# Patient Record
Sex: Male | Born: 1945 | Race: White | Hispanic: No | Smoking: Former smoker
Health system: Southern US, Community
[De-identification: ages and names within clinical notes are randomized; demographics above are authoritative.]

## PROBLEM LIST (undated history)

## (undated) ENCOUNTER — Emergency Department (HOSPITAL_COMMUNITY): Admission: EM | Payer: Medicare Other

## (undated) DIAGNOSIS — E1169 Type 2 diabetes mellitus with other specified complication: Secondary | ICD-10-CM

## (undated) DIAGNOSIS — F329 Major depressive disorder, single episode, unspecified: Secondary | ICD-10-CM

## (undated) DIAGNOSIS — M1990 Unspecified osteoarthritis, unspecified site: Secondary | ICD-10-CM

## (undated) DIAGNOSIS — C801 Malignant (primary) neoplasm, unspecified: Secondary | ICD-10-CM

## (undated) DIAGNOSIS — E611 Iron deficiency: Secondary | ICD-10-CM

## (undated) DIAGNOSIS — F32A Depression, unspecified: Secondary | ICD-10-CM

## (undated) DIAGNOSIS — N183 Chronic kidney disease, stage 3 (moderate): Secondary | ICD-10-CM

## (undated) DIAGNOSIS — R413 Other amnesia: Secondary | ICD-10-CM

## (undated) DIAGNOSIS — I10 Essential (primary) hypertension: Secondary | ICD-10-CM

## (undated) DIAGNOSIS — K219 Gastro-esophageal reflux disease without esophagitis: Secondary | ICD-10-CM

## (undated) DIAGNOSIS — N521 Erectile dysfunction due to diseases classified elsewhere: Secondary | ICD-10-CM

## (undated) DIAGNOSIS — E785 Hyperlipidemia, unspecified: Secondary | ICD-10-CM

## (undated) DIAGNOSIS — E119 Type 2 diabetes mellitus without complications: Secondary | ICD-10-CM

## (undated) DIAGNOSIS — E291 Testicular hypofunction: Secondary | ICD-10-CM

## (undated) DIAGNOSIS — Z87891 Personal history of nicotine dependence: Secondary | ICD-10-CM

## (undated) DIAGNOSIS — K5790 Diverticulosis of intestine, part unspecified, without perforation or abscess without bleeding: Secondary | ICD-10-CM

## (undated) DIAGNOSIS — J301 Allergic rhinitis due to pollen: Secondary | ICD-10-CM

## (undated) DIAGNOSIS — Z860100 Personal history of colon polyps, unspecified: Secondary | ICD-10-CM

## (undated) HISTORY — PX: TOTAL HIP ARTHROPLASTY: SHX124

## (undated) HISTORY — DX: Type 2 diabetes mellitus with other specified complication: E11.69

## (undated) HISTORY — DX: Essential (primary) hypertension: I10

## (undated) HISTORY — DX: Personal history of colon polyps, unspecified: Z86.0100

## (undated) HISTORY — DX: Hyperlipidemia, unspecified: E78.5

## (undated) HISTORY — DX: Erectile dysfunction due to diseases classified elsewhere: N52.1

## (undated) HISTORY — DX: Allergic rhinitis due to pollen: J30.1

## (undated) HISTORY — DX: Diverticulosis of intestine, part unspecified, without perforation or abscess without bleeding: K57.90

## (undated) HISTORY — DX: Other amnesia: R41.3

## (undated) HISTORY — DX: Gastro-esophageal reflux disease without esophagitis: K21.9

## (undated) HISTORY — DX: Unspecified osteoarthritis, unspecified site: M19.90

## (undated) HISTORY — DX: Iron deficiency: E61.1

## (undated) HISTORY — DX: Personal history of nicotine dependence: Z87.891

## (undated) HISTORY — PX: EYE SURGERY: SHX253

## (undated) HISTORY — DX: Personal history of colonic polyps: Z86.010

## (undated) HISTORY — DX: Depression, unspecified: F32.A

## (undated) HISTORY — DX: Testicular hypofunction: E29.1

## (undated) HISTORY — DX: Major depressive disorder, single episode, unspecified: F32.9

## (undated) HISTORY — DX: Type 2 diabetes mellitus without complications: E11.9

## (undated) HISTORY — DX: Chronic kidney disease, stage 3 (moderate): N18.3

---
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
## 1978-04-12 HISTORY — PX: CHOLECYSTECTOMY: SHX55

## 2003-04-30 ENCOUNTER — Encounter: Admission: RE | Admit: 2003-04-30 | Discharge: 2003-04-30 | Payer: Self-pay | Admitting: Family Medicine

## 2003-05-10 ENCOUNTER — Encounter: Admission: RE | Admit: 2003-05-10 | Discharge: 2003-05-10 | Payer: Self-pay | Admitting: Family Medicine

## 2003-05-13 ENCOUNTER — Encounter: Admission: RE | Admit: 2003-05-13 | Discharge: 2003-05-13 | Payer: Self-pay | Admitting: Family Medicine

## 2003-08-28 ENCOUNTER — Encounter: Admission: RE | Admit: 2003-08-28 | Discharge: 2003-08-28 | Payer: Self-pay | Admitting: Orthopedic Surgery

## 2004-03-03 ENCOUNTER — Encounter: Admission: RE | Admit: 2004-03-03 | Discharge: 2004-03-03 | Payer: Self-pay | Admitting: Family Medicine

## 2004-06-29 ENCOUNTER — Ambulatory Visit: Payer: Self-pay | Admitting: Internal Medicine

## 2004-07-13 ENCOUNTER — Ambulatory Visit: Payer: Self-pay | Admitting: Internal Medicine

## 2005-05-20 ENCOUNTER — Emergency Department (HOSPITAL_COMMUNITY): Admission: EM | Admit: 2005-05-20 | Discharge: 2005-05-20 | Payer: Self-pay | Admitting: Emergency Medicine

## 2005-06-11 ENCOUNTER — Encounter: Admission: RE | Admit: 2005-06-11 | Discharge: 2005-06-11 | Payer: Self-pay | Admitting: Family Medicine

## 2007-04-13 HISTORY — PX: TOTAL HIP ARTHROPLASTY: SHX124

## 2007-11-01 ENCOUNTER — Inpatient Hospital Stay (HOSPITAL_COMMUNITY): Admission: RE | Admit: 2007-11-01 | Discharge: 2007-11-05 | Payer: Self-pay | Admitting: Orthopedic Surgery

## 2008-06-13 ENCOUNTER — Ambulatory Visit: Payer: Self-pay | Admitting: Family Medicine

## 2008-06-13 DIAGNOSIS — M1990 Unspecified osteoarthritis, unspecified site: Secondary | ICD-10-CM | POA: Insufficient documentation

## 2008-06-13 DIAGNOSIS — M109 Gout, unspecified: Secondary | ICD-10-CM | POA: Insufficient documentation

## 2008-06-13 DIAGNOSIS — I10 Essential (primary) hypertension: Secondary | ICD-10-CM

## 2008-06-13 DIAGNOSIS — E781 Pure hyperglyceridemia: Secondary | ICD-10-CM

## 2008-06-13 DIAGNOSIS — K219 Gastro-esophageal reflux disease without esophagitis: Secondary | ICD-10-CM

## 2008-06-13 DIAGNOSIS — F3341 Major depressive disorder, recurrent, in partial remission: Secondary | ICD-10-CM

## 2008-06-13 DIAGNOSIS — J309 Allergic rhinitis, unspecified: Secondary | ICD-10-CM | POA: Insufficient documentation

## 2008-08-28 ENCOUNTER — Ambulatory Visit: Payer: Self-pay | Admitting: Family Medicine

## 2008-08-28 LAB — CONVERTED CEMR LAB
ALT: 20 units/L (ref 0–53)
AST: 18 units/L (ref 0–37)
Albumin: 3.7 g/dL (ref 3.5–5.2)
Alkaline Phosphatase: 63 units/L (ref 39–117)
BUN: 21 mg/dL (ref 6–23)
Basophils Absolute: 0 10*3/uL (ref 0.0–0.1)
Basophils Relative: 0 % (ref 0.0–3.0)
Bilirubin, Direct: 0.1 mg/dL (ref 0.0–0.3)
CO2: 29 meq/L (ref 19–32)
Calcium: 8.7 mg/dL (ref 8.4–10.5)
Chloride: 104 meq/L (ref 96–112)
Cholesterol: 153 mg/dL (ref 0–200)
Creatinine, Ser: 1.4 mg/dL (ref 0.4–1.5)
Creatinine,U: 218.2 mg/dL
Direct LDL: 68.9 mg/dL
Eosinophils Absolute: 0.1 10*3/uL (ref 0.0–0.7)
Eosinophils Relative: 1.4 % (ref 0.0–5.0)
GFR calc non Af Amer: 54.44 mL/min (ref >60)
Glucose, Bld: 218 mg/dL — ABNORMAL HIGH (ref 70–99)
HCT: 32.5 % — ABNORMAL LOW (ref 39.0–52.0)
HDL: 30.4 mg/dL — ABNORMAL LOW (ref >39.00)
Hemoglobin: 11.2 g/dL — ABNORMAL LOW (ref 13.0–17.0)
Hgb A1c MFr Bld: 9.4 % — ABNORMAL HIGH (ref 4.6–6.5)
Lymphocytes Relative: 30.5 % (ref 12.0–46.0)
Lymphs Abs: 1.2 10*3/uL (ref 0.7–4.0)
MCHC: 34.4 g/dL (ref 30.0–36.0)
MCV: 90.6 fL (ref 78.0–100.0)
Microalb Creat Ratio: 9.2 mg/g (ref 0.0–30.0)
Microalb, Ur: 2 mg/dL — ABNORMAL HIGH (ref 0.0–1.9)
Monocytes Absolute: 0.5 10*3/uL (ref 0.1–1.0)
Monocytes Relative: 13 % — ABNORMAL HIGH (ref 3.0–12.0)
Neutro Abs: 2.2 10*3/uL (ref 1.4–7.7)
Neutrophils Relative %: 55.1 % (ref 43.0–77.0)
Platelets: 148 10*3/uL — ABNORMAL LOW (ref 150.0–400.0)
Potassium: 4.2 meq/L (ref 3.5–5.1)
RBC: 3.59 M/uL — ABNORMAL LOW (ref 4.22–5.81)
RDW: 12.2 % (ref 11.5–14.6)
Sodium: 140 meq/L (ref 135–145)
Total Bilirubin: 0.6 mg/dL (ref 0.3–1.2)
Total CHOL/HDL Ratio: 5
Total Protein: 6.4 g/dL (ref 6.0–8.3)
Triglycerides: 366 mg/dL — ABNORMAL HIGH (ref 0.0–149.0)
VLDL: 73.2 mg/dL — ABNORMAL HIGH (ref 0.0–40.0)
WBC: 4 10*3/uL — ABNORMAL LOW (ref 4.5–10.5)

## 2008-09-03 ENCOUNTER — Ambulatory Visit: Payer: Self-pay | Admitting: Family Medicine

## 2008-09-11 ENCOUNTER — Telehealth: Payer: Self-pay | Admitting: Family Medicine

## 2008-09-11 ENCOUNTER — Encounter: Payer: Self-pay | Admitting: Family Medicine

## 2008-09-13 ENCOUNTER — Ambulatory Visit: Payer: Self-pay | Admitting: Family Medicine

## 2008-09-24 ENCOUNTER — Ambulatory Visit: Payer: Self-pay | Admitting: Family Medicine

## 2008-10-07 ENCOUNTER — Encounter: Payer: Self-pay | Admitting: Family Medicine

## 2008-10-25 ENCOUNTER — Ambulatory Visit: Payer: Self-pay | Admitting: Family Medicine

## 2008-12-09 ENCOUNTER — Telehealth: Payer: Self-pay | Admitting: Family Medicine

## 2008-12-17 ENCOUNTER — Encounter: Payer: Self-pay | Admitting: Family Medicine

## 2008-12-19 ENCOUNTER — Telehealth: Payer: Self-pay | Admitting: Family Medicine

## 2008-12-31 ENCOUNTER — Ambulatory Visit: Payer: Self-pay | Admitting: Family Medicine

## 2009-01-08 ENCOUNTER — Telehealth (INDEPENDENT_AMBULATORY_CARE_PROVIDER_SITE_OTHER): Payer: Self-pay | Admitting: Internal Medicine

## 2009-01-23 ENCOUNTER — Ambulatory Visit: Payer: Self-pay | Admitting: Family Medicine

## 2009-01-24 LAB — CONVERTED CEMR LAB
BUN: 16 mg/dL (ref 6–23)
CO2: 29 meq/L (ref 19–32)
Calcium: 8.8 mg/dL (ref 8.4–10.5)
Chloride: 99 meq/L (ref 96–112)
Creatinine, Ser: 1.2 mg/dL (ref 0.4–1.5)
GFR calc non Af Amer: 64.95 mL/min (ref >60)
Glucose, Bld: 176 mg/dL — ABNORMAL HIGH (ref 70–99)
Hgb A1c MFr Bld: 8.4 % — ABNORMAL HIGH (ref 4.6–6.5)
Potassium: 4.2 meq/L (ref 3.5–5.1)
Sodium: 138 meq/L (ref 135–145)

## 2009-01-27 ENCOUNTER — Ambulatory Visit: Payer: Self-pay | Admitting: Family Medicine

## 2009-02-05 ENCOUNTER — Telehealth (INDEPENDENT_AMBULATORY_CARE_PROVIDER_SITE_OTHER): Payer: Self-pay | Admitting: Internal Medicine

## 2009-02-05 ENCOUNTER — Telehealth: Payer: Self-pay | Admitting: Family Medicine

## 2009-03-13 ENCOUNTER — Telehealth: Payer: Self-pay | Admitting: Family Medicine

## 2009-03-31 ENCOUNTER — Telehealth: Payer: Self-pay | Admitting: Family Medicine

## 2009-04-24 ENCOUNTER — Ambulatory Visit: Payer: Self-pay | Admitting: Family Medicine

## 2009-04-24 LAB — CONVERTED CEMR LAB
ALT: 23 units/L (ref 0–53)
AST: 19 units/L (ref 0–37)
Albumin: 3.9 g/dL (ref 3.5–5.2)
Alkaline Phosphatase: 69 units/L (ref 39–117)
BUN: 23 mg/dL (ref 6–23)
Bilirubin, Direct: 0 mg/dL (ref 0.0–0.3)
CO2: 28 meq/L (ref 19–32)
Calcium: 8.7 mg/dL (ref 8.4–10.5)
Chloride: 97 meq/L (ref 96–112)
Cholesterol: 120 mg/dL (ref 0–200)
Creatinine, Ser: 1.4 mg/dL (ref 0.4–1.5)
Direct LDL: 43.4 mg/dL
GFR calc non Af Amer: 54.32 mL/min (ref >60)
Glucose, Bld: 218 mg/dL — ABNORMAL HIGH (ref 70–99)
HDL: 31.9 mg/dL — ABNORMAL LOW (ref >39.00)
Hgb A1c MFr Bld: 9.8 % — ABNORMAL HIGH (ref 4.6–6.5)
LDL Cholesterol: 43 mg/dL
Potassium: 4.1 meq/L (ref 3.5–5.1)
Sodium: 132 meq/L — ABNORMAL LOW (ref 135–145)
Total Bilirubin: 0.8 mg/dL (ref 0.3–1.2)
Total CHOL/HDL Ratio: 4
Total Protein: 7 g/dL (ref 6.0–8.3)
Triglycerides: 395 mg/dL — ABNORMAL HIGH (ref 0.0–149.0)
VLDL: 79 mg/dL — ABNORMAL HIGH (ref 0.0–40.0)

## 2009-05-01 ENCOUNTER — Ambulatory Visit: Payer: Self-pay | Admitting: Family Medicine

## 2009-05-01 LAB — CONVERTED CEMR LAB
Cholesterol, target level: 200 mg/dL
HDL goal, serum: 40 mg/dL
LDL Goal: 100 mg/dL

## 2009-06-06 ENCOUNTER — Encounter (INDEPENDENT_AMBULATORY_CARE_PROVIDER_SITE_OTHER): Payer: Self-pay | Admitting: *Deleted

## 2009-07-22 ENCOUNTER — Ambulatory Visit: Payer: Self-pay | Admitting: Family Medicine

## 2009-07-22 LAB — CONVERTED CEMR LAB: Hgb A1c MFr Bld: 9.3 % — ABNORMAL HIGH (ref 4.6–6.5)

## 2009-07-28 ENCOUNTER — Ambulatory Visit: Payer: Self-pay | Admitting: Family Medicine

## 2009-07-28 ENCOUNTER — Telehealth: Payer: Self-pay | Admitting: Family Medicine

## 2009-07-28 DIAGNOSIS — R21 Rash and other nonspecific skin eruption: Secondary | ICD-10-CM | POA: Insufficient documentation

## 2009-08-15 ENCOUNTER — Ambulatory Visit: Payer: Self-pay | Admitting: Family Medicine

## 2009-08-18 ENCOUNTER — Ambulatory Visit: Payer: Self-pay | Admitting: Endocrinology

## 2009-08-19 LAB — CONVERTED CEMR LAB: TSH: 0.87 microintl units/mL (ref 0.35–5.50)

## 2009-09-01 ENCOUNTER — Ambulatory Visit: Payer: Self-pay | Admitting: Endocrinology

## 2009-09-30 ENCOUNTER — Encounter: Payer: Self-pay | Admitting: Endocrinology

## 2009-10-01 ENCOUNTER — Ambulatory Visit: Payer: Self-pay | Admitting: Endocrinology

## 2009-10-01 LAB — CONVERTED CEMR LAB: Fructosamine: 304 micromoles/L — ABNORMAL HIGH (ref <285)

## 2009-10-17 ENCOUNTER — Encounter (INDEPENDENT_AMBULATORY_CARE_PROVIDER_SITE_OTHER): Payer: Self-pay | Admitting: *Deleted

## 2009-12-24 ENCOUNTER — Encounter (INDEPENDENT_AMBULATORY_CARE_PROVIDER_SITE_OTHER): Payer: Self-pay | Admitting: *Deleted

## 2009-12-24 ENCOUNTER — Ambulatory Visit: Payer: Self-pay | Admitting: Internal Medicine

## 2009-12-24 DIAGNOSIS — R197 Diarrhea, unspecified: Secondary | ICD-10-CM

## 2009-12-25 LAB — CONVERTED CEMR LAB
BUN: 16 mg/dL (ref 6–23)
Basophils Absolute: 0 10*3/uL (ref 0.0–0.1)
Basophils Relative: 0.2 % (ref 0.0–3.0)
CO2: 29 meq/L (ref 19–32)
Calcium: 9.2 mg/dL (ref 8.4–10.5)
Chloride: 104 meq/L (ref 96–112)
Creatinine, Ser: 1.3 mg/dL (ref 0.4–1.5)
Eosinophils Absolute: 0.1 10*3/uL (ref 0.0–0.7)
Eosinophils Relative: 1 % (ref 0.0–5.0)
GFR calc non Af Amer: 60.11 mL/min (ref >60)
Glucose, Bld: 212 mg/dL — ABNORMAL HIGH (ref 70–99)
HCT: 32.8 % — ABNORMAL LOW (ref 39.0–52.0)
Hemoglobin: 11.4 g/dL — ABNORMAL LOW (ref 13.0–17.0)
Hgb A1c MFr Bld: 9.2 % — ABNORMAL HIGH (ref 4.6–6.5)
Lymphocytes Relative: 18.8 % (ref 12.0–46.0)
Lymphs Abs: 1.2 10*3/uL (ref 0.7–4.0)
MCHC: 34.8 g/dL (ref 30.0–36.0)
MCV: 91.7 fL (ref 78.0–100.0)
Monocytes Absolute: 0.7 10*3/uL (ref 0.1–1.0)
Monocytes Relative: 10.6 % (ref 3.0–12.0)
Neutro Abs: 4.4 10*3/uL (ref 1.4–7.7)
Neutrophils Relative %: 69.4 % (ref 43.0–77.0)
PSA: 0.4 ng/mL (ref 0.10–4.00)
Platelets: 168 10*3/uL (ref 150.0–400.0)
Potassium: 4.6 meq/L (ref 3.5–5.1)
RBC: 3.58 M/uL — ABNORMAL LOW (ref 4.22–5.81)
RDW: 12.9 % (ref 11.5–14.6)
Sodium: 141 meq/L (ref 135–145)
TSH: 1.02 microintl units/mL (ref 0.35–5.50)
WBC: 6.3 10*3/uL (ref 4.5–10.5)

## 2010-01-02 ENCOUNTER — Telehealth: Payer: Self-pay | Admitting: Family Medicine

## 2010-01-02 ENCOUNTER — Encounter: Payer: Self-pay | Admitting: Family Medicine

## 2010-01-02 ENCOUNTER — Encounter (INDEPENDENT_AMBULATORY_CARE_PROVIDER_SITE_OTHER): Payer: Self-pay | Admitting: *Deleted

## 2010-01-06 ENCOUNTER — Encounter (INDEPENDENT_AMBULATORY_CARE_PROVIDER_SITE_OTHER): Payer: Self-pay | Admitting: *Deleted

## 2010-01-06 ENCOUNTER — Ambulatory Visit: Payer: Self-pay | Admitting: Internal Medicine

## 2010-01-16 ENCOUNTER — Ambulatory Visit: Payer: Self-pay | Admitting: Internal Medicine

## 2010-01-29 ENCOUNTER — Encounter: Payer: Self-pay | Admitting: Internal Medicine

## 2010-02-05 ENCOUNTER — Ambulatory Visit: Payer: Self-pay | Admitting: Family Medicine

## 2010-02-05 DIAGNOSIS — Z96649 Presence of unspecified artificial hip joint: Secondary | ICD-10-CM | POA: Insufficient documentation

## 2010-02-05 DIAGNOSIS — M25559 Pain in unspecified hip: Secondary | ICD-10-CM | POA: Insufficient documentation

## 2010-05-12 NOTE — Procedures (Signed)
Summary: Colonoscopy  Patient: [name] Note: All result statuses are Final unless otherwise noted.  Tests: (1) Colonoscopy (COL)   COL Colonoscopy           DONE      [facility]     [address]     [place], [place]  [address]           COLONOSCOPY PROCEDURE REPORT           PATIENT:  [name], [name]  MR#:  [id number]     BIRTHDATE:  1946-01-28, 64 yrs. old  GENDER:  male     ENDOSCOPIST:  Iva Boop, MD, [facility]           PROCEDURE DATE:  01/16/2010     PROCEDURE:  Colonoscopy with biopsy     ASA CLASS:  Class III     INDICATIONS:  surveillance and high-risk screening, history of     pre-cancerous (adenomatous) colon polyps patient report of     precancerous polyps 1998 ([place])     Had hyperplastic polyps 2006     MEDICATIONS:   Fentanyl 50 mcg IV, Versed 6 mg IV           DESCRIPTION OF PROCEDURE:   After the risks benefits and     alternatives of the procedure were thoroughly explained, informed     consent was obtained.  Digital rectal exam was performed and     revealed tender.  Indurated anal canal.  The LB 180AL [id number]     endoscope was introduced through the anus and advanced to the     cecum, which was identified by both the appendix and ileocecal     valve, without limitations.  The quality of the prep was     excellent, using MoviPrep.  The instrument was then slowly     withdrawn as the colon was fully examined. Insertion: 2:08 minutes     Withdrawal: 15:20 minutes     <<PROCEDUREIMAGES>>           FINDINGS:  There were mild diverticular changes in left colon.     diverticulosis was found.  Three polyps were found. They were     diminutive. They were 2 - 4 mm in size.  Removed by cold biopsy     from the hepatic flexure, transverse and sigmoid colon areas. A     small  lipoma was seen in the transverse colon. This was otherwise     a normal examination of the colon.   Retroflexed views in the     rectum revealed internal hemorrhoids.     The scope was then     withdrawn from the patient and the procedure completed.           COMPLICATIONS:  None     ENDOSCOPIC IMPRESSION:     1) Diverticulosis,mild,left sided diverticulosis     2)  Three diminutive polyps removed     3) Otherwise normal examination except for small lipoma in     transverse colon.     4) Internal hemorrhoids     5) Personal history of polyps, reported by patient to be adenomas     in 1996     RECOMMENDATIONS:     Lidocaine-hydrocortisone cream prescribed for ano-rectal pain     and irritated hemorrhoids in anal canal.     REPEAT EXAM:  In for Colonoscopy, pending biopsy results.  Iva Boop, MD, [facility]           CC:  Eustaquio Boyden MD     The Patient           n.     Rosalie Doctor:   Iva Boop at 01/16/2010 11:20 AM           Ronny Flurry, [phone]  Note: An exclamation mark (!) indicates a result that was not dispersed into the flowsheet. Document Creation Date: 01/16/2010 11:20 AM _______________________________________________________________________  (1) Order result status: Final Collection or observation date-time: 01/16/2010 11:00 Requested date-time:  Receipt date-time:  Reported date-time:  Referring Physician:   Ordering Physician: Stan Head [phone]) Specimen Source:  Source: Launa Grill Order Number: [id number] Lab site:   Appended Document: Colonoscopy   Colonoscopy  Procedure date:  01/16/2010  Findings:        1) Diverticulosis,mild,left sided diverticulosis     2)  Three diminutive polyps removed - HYPERPLASTIC AND NORMAL MUCOSA     3) Otherwise normal examination except for small lipoma in     transverse colon.     4) Internal hemorrhoids     5) Personal history of polyps, reported by patient to be adenomas     in 1996     RECOMMENDATIONS:     Lidocaine-hydrocortisone cream prescribed for ano-rectal pain     and irritated hemorrhoids in anal canal.  Comments:       Repeat colonoscopy  in 10 years.    Procedures Next Due Date:    Colonoscopy: 01/2020   Appended Document: Colonoscopy     Procedures Next Due Date:    Colonoscopy: 01/2020  Appended Document: Colonoscopy    Clinical Lists Changes  Observations: Added new observation of PAST MED HX: Depression Diabetes mellitus, type II Hyperlipidemia Hypertension Osteoarthritis Allergic rhinitis GERD Gout Diverticulosis  Former patient of Dr. Artis Flock Ortho = Dr. Darrelyn Hillock [facility]) (01/21/2010 18:29) Added new observation of PAST SURG HX: Cholecystectomy, 1980 Total hip replacement, 2009 ([facility])  Colonoscopy 2011 - hyperplastic polyps, diverticulosis, rec rpt 2021 (01/21/2010 18:29)        Past History:  Past Medical History: Depression Diabetes mellitus, type II Hyperlipidemia Hypertension Osteoarthritis Allergic rhinitis GERD Gout Diverticulosis  Former patient of Dr. Artis Flock Ortho = Dr. Darrelyn Hillock [facility])  Past Surgical History: Cholecystectomy, 1980 Total hip replacement, 2009 ([facility])  Colonoscopy 2011 - hyperplastic polyps, diverticulosis, rec rpt 2021

## 2010-05-12 NOTE — Miscellaneous (Signed)
Summary: recall colon--ch.  Clinical Lists Changes  Medications: Added new medication of MOVIPREP 100 GM  SOLR (PEG-KCL-NACL-NASULF-NA ASC-C) As directed - Signed Rx of MOVIPREP 100 GM  SOLR (PEG-KCL-NACL-NASULF-NA ASC-C) As directed;  #1 x 0;  Signed;  Entered by: Clide Cliff RN;  Authorized by: Iva Boop MD, [facility];  Method used: Electronically to [facility] [address], [place], [place]  [address], Ph: [phone], Fax: [phone]    Prescriptions: MOVIPREP 100 GM  SOLR (PEG-KCL-NACL-NASULF-NA ASC-C) As directed  #1 x 0   Entered by:   Clide Cliff RN   Authorized by:   Iva Boop MD, [facility]   Signed by:   Clide Cliff RN on 01/06/2010   Method used:   Electronically to        [facility]* (retail)       [address]       [place], [place]  [address]       Ph: [phone]       Fax: [phone]   RxID:   [id number]

## 2010-05-12 NOTE — Miscellaneous (Signed)
Summary: Lido-HC cream  Clinical Lists Changes  Medications: Added new medication of LIDOCAINE-HYDROCORTISONE ACE 3-0.5 % CREA (LIDOCAINE-HYDROCORTISONE ACE) apply smalll amount to ano-rectal area two times a day and more as needed up to 4 times a day for painful hemorrhoids - Signed Rx of LIDOCAINE-HYDROCORTISONE ACE 3-0.5 % CREA (LIDOCAINE-HYDROCORTISONE ACE) apply smalll amount to ano-rectal area two times a day and more as needed up to 4 times a day for painful hemorrhoids;  #30 gram tube x 1;  Signed;  Entered by: Iva Boop MD, [facility];  Authorized by: Iva Boop MD, [facility];  Method used: Electronically to [facility] [address], [place], [place]  [address], Ph: [phone], Fax: [phone]    Prescriptions: LIDOCAINE-HYDROCORTISONE ACE 3-0.5 % CREA (LIDOCAINE-HYDROCORTISONE ACE) apply smalll amount to ano-rectal area two times a day and more as needed up to 4 times a day for painful hemorrhoids  #30 gram tube x 1   Entered and Authorized by:   Iva Boop MD, [facility]   Signed by:   Iva Boop MD, FACG on 01/16/2010   Method used:   Electronically to        [facility]* (retail)       [address]       [place], [place]  [address]       Ph: [phone]       Fax: [phone]   RxID:   [id number]

## 2010-05-12 NOTE — Assessment & Plan Note (Signed)
Summary: 3 M F/U DLO   Vital Signs:  Patient profile:   65 year old male Height:      72.75 inches Weight:      253.6 pounds BMI:     33.81 Temp:     97.9 degrees F oral Pulse rate:   72 / minute Pulse rhythm:   regular BP sitting:   110 / 70  (left arm) Cuff size:   regular  Vitals Entered By: Benny Lennert CMA [facility]) (July 28, 2009 8:14 AM)  History of Present Illness: Chief complaint 3 month follow up  DM: A1c = 9 lantus 60 15 units of rapid acting diet not so bad  Continued bumps on face and chest. Thought ? fungal last visit, no improvement with diflucan.  Other few skin lesions that we evaluated   ROS: GEN: No acute illnesses, no fevers, chills, sweats, fatigue, weight loss, or URI sx. GI: No n/v/d Pulm: No SOB, cough, wheezing Interactive and getting along well at home.  Otherwise, ROS is as per the HPI.    GEN: WDWN, NAD, Non-toxic, A & O x 3 HEENT: Atraumatic, Normocephalic. Neck supple. No masses, No LAD. Ears and Nose: No external deformity. CV: RRR, No M/G/R. No JVD. No thrill. No extra heart sounds. PULM: CTA B, no wheezes, crackles, rhonchi. No retractions. No resp. distress. No accessory muscle use. EXTR: No c/c/e NEURO: Normal gait.  PSYCH: Normally interactive. Conversant. Not depressed or anxious appearing.  Calm demeanor.     Allergies: 1)  ! * Tetracyline  Past History:  Past medical, surgical, family and social histories (including risk factors) reviewed, and no changes noted (except as noted below).  Past Medical History: Reviewed history from 06/13/2008 and no changes required. Depression Diabetes mellitus, type II Hyperlipidemia Hypertension Osteoarthritis Allergic rhinitis GERD Gout  Former patient of Dr. Artis Flock Ortho = Dr. Darrelyn Hillock [facility])  Past Surgical History: Reviewed history from 06/13/2008 and no changes required. Cholecystectomy, 1980 Total hip replacemen, 2009 ([facility])  Colonoscopy,  2006  Family History: Reviewed history from 06/13/2008 and no changes required. P, DM S, Lung CA CAD, GF  DM, others  Social History: Reviewed history from 06/13/2008 and no changes required. Marital Status: Married Children:  Occupation: Science writer Former smoker Alcohol use-no Drug use-no Regular exercise-no   Impression & Recommendations:  Problem # 1:  DIABETES MELLITUS, TYPE II (ICD-250.00) Assessment Deteriorated Adult onset DM, insulin dependent, doing poorly I am going to consult Endocrine to help  His updated medication list for this problem includes:    Lisinopril-hydrochlorothiazide 20-12.5 Mg Tabs (Lisinopril-hydrochlorothiazide) .[name] Kitchen... 1 daily by mouth    Metformin Hcl 1000 Mg Tabs (Metformin hcl) .[name] Kitchen... 1 by mouth two times a day    Lantus 100 Unit/ml Soln (Insulin glargine) .[name] KitchenMarland KitchenMarland KitchenMarland Kitchen 60 units subq every day as directed    Humalog 100 Unit/ml Soln (Insulin lispro (human)) .[name] KitchenMarland KitchenMarland KitchenMarland Kitchen 15 units prior to meals, dispense 1 month supply  Orders: Endocrinology Referral (Endocrine)  Labs Reviewed: Creat: 1.4 (04/24/2009)    Reviewed HgBA1c results: 9.3 (07/22/2009)  9.8 (04/24/2009)  Problem # 2:  RASH-NONVESICULAR (ICD-782.1) Assessment: New Topical acne like rash, will use Clinda cream  Complete Medication List: 1)  Flonase 50 Mcg/act Susp (Fluticasone propionate) .... 2 sprays each nostril daily 2)  Banophen 25 Mg Caps (Diphenhydramine hcl) .[name] Kitchen.. 1 as needed 3)  Cardura 8 Mg Tabs (Doxazosin mesylate) .[name] Kitchen.. 1 by mouth at bedtime 4)  Indomethacin 25 Mg Caps (Indomethacin) .... 2 tablets q 12 hours as needed  for gout pain 5)  Simvastatin 40 Mg Tabs (Simvastatin) .[name] Kitchen.. 1 daily by mouth 6)  Atenolol 100 Mg Tabs (Atenolol) .[name] Kitchen.. 1 daily by mouth 7)  Lisinopril-hydrochlorothiazide 20-12.5 Mg Tabs (Lisinopril-hydrochlorothiazide) .[name] Kitchen.. 1 daily by mouth 8)  Effexor Xr 150 Mg Xr24h-cap (Venlafaxine hcl) .[name] Kitchen.. 1 daily by mouth 9)  Famotidine 10 Mg Tabs (Famotidine) .[name] Kitchen.. 1 daily by  mouth 10)  Metformin Hcl 1000 Mg Tabs (Metformin hcl) .[name] Kitchen.. 1 by mouth two times a day 11)  One Touch Test Strips  .... Check fasting blood sugar, and after breakfast, lunch, and dinner (new insulin, 250.00) 12)  Lantus 100 Unit/ml Soln (Insulin glargine) .... 60 units subq every day as directed 13)  Clarinex 5 Mg Tabs (Desloratadine) .[name] Kitchen.. 1 once daily for congestion 14)  Humalog 100 Unit/ml Soln (Insulin lispro (human)) .[name] Kitchen.. 15 units prior to meals, dispense 1 month supply 15)  Clindamycin Phosphate 2 % Crea (Clindamycin phosphate) .... Apply two times a day  Patient Instructions: 1)  Referral Appointment Information 2)  Day/Date: 3)  Time: 4)  Place/MD: 5)  Address: 6)  Phone/Fax: 7)  Patient given appointment information. Information/Orders faxed/mailed.  8)  f/u 6 months Prescriptions: CLINDAMYCIN PHOSPHATE 2 % CREA (CLINDAMYCIN PHOSPHATE) Apply two times a day  #1 tube x 1   Entered and Authorized by:   Hannah Beat MD   Signed by:   Hannah Beat MD on 07/28/2009   Method used:   Electronically to        [facility]* (retail)       [address]       [place], [place]  [address]       Ph: [phone]       Fax: [phone]   RxID:   [id number]   Current Allergies (reviewed today): ! * TETRACYLINE

## 2010-05-12 NOTE — Assessment & Plan Note (Signed)
Summary: F/ UP / LFW   R/S FROM 01/28/10   Vital Signs:  Patient profile:   65 year old male Height:      72 inches Weight:      244.50 pounds BMI:     33.28 Temp:     98.2 degrees F oral Pulse rate:   92 / minute Pulse rhythm:   regular BP sitting:   132 / 72  (left arm) Cuff size:   large  Vitals Entered By: Lewanda Rife LPN (February 05, 2010 9:58 AM) CC: six month f/u   History of Present Illness:  DM:   Lipids:  HTN:  diarrhea: will go about once a week, sometimes will get some loose stoll. Still having some blod occ. Internal hemorrhoids  these were seen on colonoscopy. The patient's diarrhea has improved. He has had some intermittent stool approximately every week, where he will have a loose stool up to several. Still has some occasional blood, but this is minimal in nature.  BS worse. Running 180-225.  Humalog 50 units three times a day and 50 lantus at night.  hemoglobin A1c is greater than 9.  Still taking Metformin 100 mg two times a day   s/p THA. Has some mild pain. Will hurt some. Dr. Jerline Pain. six months ago.   does have a hip that was recalled. He is concerned about this, and would like to have a second surgical opinion regarding its satisfactory alignment and any potential complications.  Clinical Review Panels:  Prevention   Last Colonoscopy:    1) Diverticulosis,mild,left sided diverticulosis     2)  Three diminutive polyps removed - HYPERPLASTIC AND NORMAL MUCOSA     3) Otherwise normal examination except for small lipoma in     transverse colon.     4) Internal hemorrhoids     5) Personal history of polyps, reported by patient to be adenomas     in 1996     RECOMMENDATIONS:     Lidocaine-hydrocortisone cream prescribed for ano-rectal pain     and irritated hemorrhoids in anal canal. (01/16/2010)   Last PSA:  0.40 (12/24/2009)  Immunizations   Last Tetanus Booster:  given (04/13/2007)   Last Flu Vaccine:  Fluvax 3+ (02/05/2010)   Last  Pneumovax:  Pneumovax (10/25/2008)  Lipid Management   Cholesterol:  120 (04/24/2009)   LDL (bad choesterol):  43 (04/24/2009)   HDL (good cholesterol):  31.90 (04/24/2009)  Diabetes Management   HgBA1C:  9.2 (12/24/2009)   Creatinine:  1.3 (12/24/2009)   Last Foot Exam:  yes (02/05/2010)   Last Flu Vaccine:  Fluvax 3+ (02/05/2010)   Last Pneumovax:  Pneumovax (10/25/2008)  CBC   WBC:  6.3 (12/24/2009)   RBC:  3.58 (12/24/2009)   Hgb:  11.4 (12/24/2009)   Hct:  32.8 (12/24/2009)   Platelets:  168.0 (12/24/2009)   MCV  91.7 (12/24/2009)   MCHC  34.8 (12/24/2009)   RDW  12.9 (12/24/2009)   PMN:  69.4 (12/24/2009)   Lymphs:  18.8 (12/24/2009)   Monos:  10.6 (12/24/2009)   Eosinophils:  1.0 (12/24/2009)   Basophil:  0.2 (12/24/2009)  Complete Metabolic Panel   Glucose:  212 (12/24/2009)   Sodium:  141 (12/24/2009)   Potassium:  4.6 (12/24/2009)   Chloride:  104 (12/24/2009)   CO2:  29 (12/24/2009)   BUN:  16 (12/24/2009)   Creatinine:  1.3 (12/24/2009)   Albumin:  3.9 (04/24/2009)   Total Protein:  7.0 (04/24/2009)   Calcium:  9.2 (12/24/2009)   Total Bili:  0.8 (04/24/2009)   Alk Phos:  69 (04/24/2009)   SGPT (ALT):  23 (04/24/2009)   SGOT (AST):  19 (04/24/2009)   Allergies: 1)  ! * Tetracyline  Past History:  Past medical, surgical, family and social histories (including risk factors) reviewed, and no changes noted (except as noted below).  Past Medical History: Depression Diabetes mellitus, type II Hyperlipidemia Hypertension Osteoarthritis Allergic rhinitis GERD Gout Diverticulosis  Past Surgical History: Reviewed history from 01/21/2010 and no changes required. Cholecystectomy, 1980 Total hip replacement, 2009 ([facility])  Colonoscopy 2011 - hyperplastic polyps, diverticulosis, rec rpt 2021  Family History: Reviewed history from 08/18/2009 and no changes required. P, DM S, Lung CA CAD, GF  DM: father  Social History: Reviewed history  from 06/13/2008 and no changes required. Marital Status: Married Children:  Occupation: Science writer Former smoker Alcohol use-no Drug use-no Regular exercise-no  Review of Systems      See HPI General:  Denies chills, fatigue, and fever. Resp:  Denies shortness of breath. GI:  See HPI.  Physical Exam  General:  Well-developed,well-nourished,in no acute distress; alert,appropriate and cooperative throughout examination Head:  normocephalic and atraumatic.   Ears:  no external deformities.   Nose:  no external deformity.   Neck:  No deformities, masses, or tenderness noted. Lungs:  Normal respiratory effort, chest expands symmetrically. Lungs are clear to auscultation, no crackles or wheezes. Heart:  Normal rate and regular rhythm. S1 and S2 normal without gallop, murmur, click, rub or other extra sounds. Extremities:  No clubbing, cyanosis, edema, or deformity noted with normal full range of motion of all joints.   Neurologic:  alert & oriented X3 and gait normal.   Cervical Nodes:  No lymphadenopathy noted Psych:  Cognition and judgment appear intact. Alert and cooperative with normal attention span and concentration. No apparent delusions, illusions, hallucinations  Diabetes Management Exam:    Foot Exam (with socks and/or shoes not present):       Sensory-Pinprick/Light touch:          Left medial foot (L-4): normal          Left dorsal foot (L-5): normal          Left lateral foot (S-1): normal          Right medial foot (L-4): normal          Right dorsal foot (L-5): normal          Right lateral foot (S-1): normal       Sensory-Monofilament:          Left foot: normal          Right foot: normal       Inspection:          Left foot: normal          Right foot: normal       Nails:          Left foot: normal          Right foot: normal   Impression & Recommendations:  Problem # 1:  DIARRHEA (ICD-787.91) Assessment Improved improve, I suggested increasing his  fiber intake. P.r.n. and Imodium would be reasonable as well. The patient tells me that he at this point is satisfied with how he is, it would not like to pursue any further interventions.  Problem # 2:  HIP PAIN (ICD-719.45) status post total hip arthroplasty, most recently in normal alignment 6 months ago, films done in  [facility]. He would like to get a second opinion from a surgeon who does joint arthroplasty. I have asked Dr. Dion Saucier for his opinion in this case.  His updated medication list for this problem includes:    Indomethacin 25 Mg Caps (Indomethacin) .[name] Kitchen... 2 tablets q 12 hours as needed for gout pain  Orders: Orthopedic Surgeon Referral (Ortho Surgeon)  Problem # 3:  HIP REPLACEMENT, RIGHT, HX OF (ICD-V43.64)  Orders: Orthopedic Surgeon Referral (Ortho Surgeon)  Problem # 4:  DIABETES MELLITUS, TYPE II (ICD-250.00) a1c 9 on high doses of insulin -- upcoming appointment with Dr. Everardo All, any help appreciated.  +/- compliance with diet  His updated medication list for this problem includes:    Lisinopril-hydrochlorothiazide 20-12.5 Mg Tabs (Lisinopril-hydrochlorothiazide) .[name] Kitchen... 1 daily by mouth    Metformin Hcl 1000 Mg Tabs (Metformin hcl) .[name] Kitchen... 1 by mouth two times a day    Lantus 100 Unit/ml Soln (Insulin glargine) .[name] KitchenMarland KitchenMarland KitchenMarland Kitchen 50 units at bedtime    Humalog 100 Unit/ml Soln (Insulin lispro (human)) .[name] Kitchen... Three times a day (just before each meal), 50-50-50 units, and syringes 4x a day  Problem # 5:  HYPERTENSION (ICD-401.9)  His updated medication list for this problem includes:    Cardura 8 Mg Tabs (Doxazosin mesylate) .[name] Kitchen... 1 by mouth at bedtime    Atenolol 100 Mg Tabs (Atenolol) .[name] Kitchen... 1 daily by mouth    Lisinopril-hydrochlorothiazide 20-12.5 Mg Tabs (Lisinopril-hydrochlorothiazide) .[name] Kitchen... 1 daily by mouth  BP today: 132/72 Prior BP: 130/80 (12/24/2009)  Prior 10 Yr Risk Heart Disease: 14 % (05/01/2009)  Labs Reviewed: K+: 4.6 (12/24/2009) Creat: : 1.3  (12/24/2009)   Chol: 120 (04/24/2009)   HDL: 31.90 (04/24/2009)   LDL: 43 (04/24/2009)   TG: 395.0 (04/24/2009)  Problem # 6:  HYPERLIPIDEMIA (ICD-272.4)  His updated medication list for this problem includes:    Simvastatin 40 Mg Tabs (Simvastatin) .[name] Kitchen... 1 daily by mouth  Labs Reviewed: SGOT: 19 (04/24/2009)   SGPT: 23 (04/24/2009)  Lipid Goals: Chol Goal: 200 (05/01/2009)   HDL Goal: 40 (05/01/2009)   LDL Goal: 100 (05/01/2009)   TG Goal: 150 (05/01/2009)  Prior 10 Yr Risk Heart Disease: 14 % (05/01/2009)   HDL:31.90 (04/24/2009), 30.40 (08/28/2008)  LDL:43 (04/24/2009)  Chol:120 (04/24/2009), 153 (08/28/2008)  Trig:395.0 (04/24/2009), 366.0 (08/28/2008)  Complete Medication List: 1)  Flonase 50 Mcg/act Susp (Fluticasone propionate) .... 2 sprays each nostril daily 2)  Banophen 25 Mg Caps (Diphenhydramine hcl) .[name] Kitchen.. 1 as needed 3)  Cardura 8 Mg Tabs (Doxazosin mesylate) .[name] Kitchen.. 1 by mouth at bedtime 4)  Indomethacin 25 Mg Caps (Indomethacin) .... 2 tablets q 12 hours as needed for gout pain 5)  Simvastatin 40 Mg Tabs (Simvastatin) .[name] Kitchen.. 1 daily by mouth 6)  Atenolol 100 Mg Tabs (Atenolol) .[name] Kitchen.. 1 daily by mouth 7)  Lisinopril-hydrochlorothiazide 20-12.5 Mg Tabs (Lisinopril-hydrochlorothiazide) .[name] Kitchen.. 1 daily by mouth 8)  Venlafaxine Hcl 225 Mg Xr24h-tab (Venlafaxine hcl) .... One daily for depression 9)  Famotidine 10 Mg Tabs (Famotidine) .[name] Kitchen.. 1 daily by mouth 10)  Metformin Hcl 1000 Mg Tabs (Metformin hcl) .[name] Kitchen.. 1 by mouth two times a day 11)  Onetouch Ultra Test Strp (Glucose blood) .... Check fasting blood sugar and after breakfast, lunch and dinner 12)  Lantus 100 Unit/ml Soln (Insulin glargine) .... 50 units at bedtime 13)  Clarinex 5 Mg Tabs (Desloratadine) .[name] Kitchen.. 1 once daily for congestion as needed 14)  Humalog 100 Unit/ml Soln (Insulin lispro (human)) .... Three times a day (just before each meal), 50-50-50 units,  and syringes 4x a day 15)  Metronidazole 0.75 % Crea (Metronidazole)  .... Apply two times a day 16)  Vitamin D 1000 Unit Tabs (Cholecalciferol) .[name] Kitchen.. 1 by mouth once daily 17)  B Complex Tabs (B complex vitamins) .[name] Kitchen.. 1 by mouth two times a day 18)  Lidocaine-hydrocortisone Ace 3-0.5 % Crea (Lidocaine-hydrocortisone ace) .... Apply smalll amount to ano-rectal area two times a day and more as needed up to 4 times a day for painful hemorrhoids  Other Orders: Admin 1st Vaccine (04540) Flu Vaccine 40yrs + (98119)  Patient Instructions: 1)  Referral Appointment Information 2)  Day/Date: 3)  Time: 4)  Place/MD: 5)  Address: 6)  Phone/Fax: 7)  Patient given appointment information. Information/Orders faxed/mailed.    Orders Added: 1)  Admin 1st Vaccine [90471] 2)  Flu Vaccine 37yrs + [14782] 3)  Orthopedic Surgeon Referral [Ortho Surgeon] 4)  Est. Patient Level IV [95621]    Current Allergies (reviewed today): ! * TETRACYLINE     Flu Vaccine Consent Questions     Do you have a history of severe allergic reactions to this vaccine? no    Any prior history of allergic reactions to egg and/or gelatin? no    Do you have a sensitivity to the preservative Thimersol? no    Do you have a past history of Guillan-Barre Syndrome? no    Do you currently have an acute febrile illness? no    Have you ever had a severe reaction to latex? no    Vaccine information given and explained to patient? yes    Are you currently pregnant? no    Lot Number:[id number]   Exp Date:10/10/2010   Site Given  Left Deltoid [email or web address] Lewanda Rife LPN  February 05, 2010 10:02 AM

## 2010-05-12 NOTE — Letter (Signed)
Summary: [facility] Instructions  [facility]  [address] [place], [place] [address]   Phone: [phone]  Fax: [phone]       [name]    1946-03-18    [name]: [id number]        Procedure Day Dorna Bloom:  Farrell Ours  01/16/10     Arrival Time:  9:30AM     Procedure Time:  10:30AM     Location of Procedure:                    _X _  [facility] (4th Floor)                       PREPARATION FOR COLONOSCOPY WITH MOVIPREP   Starting 5 days prior to your procedure 01/11/10 do not eat nuts, seeds, popcorn, corn, beans, peas,  salads, or any raw vegetables.  Do not take any fiber supplements (e.g. Metamucil, Citrucel, and Benefiber).  THE DAY BEFORE YOUR PROCEDURE         DATE: 01/15/10   DAY: THURSDAY  1.  Drink clear liquids the entire day-NO SOLID FOOD  2.  Do not drink anything colored red or purple.  Avoid juices with pulp.  No orange juice.  3.  Drink at least 64 oz. (8 glasses) of fluid/clear liquids during the day to prevent dehydration and help the prep work efficiently.  CLEAR LIQUIDS INCLUDE: Water Jello Ice Popsicles Tea (sugar ok, no milk/cream) Powdered fruit flavored drinks Coffee (sugar ok, no milk/cream) Gatorade Juice: apple, white grape, white cranberry  Lemonade Clear bullion, consomm, broth Carbonated beverages (any kind) Strained chicken noodle soup Hard Candy                             4.  In the morning, mix first dose of MoviPrep solution:    Empty 1 Pouch A and 1 Pouch B into the disposable container    Add lukewarm drinking water to the top line of the container. Mix to dissolve    Refrigerate (mixed solution should be used within 24 hrs)  5.  Begin drinking the prep at 5:00 p.m. The MoviPrep container is divided by 4 marks.   Every 15 minutes drink the solution down to the next mark (approximately 8 oz) until the full liter is complete.   6.  Follow completed prep with 16 oz of clear liquid of your choice  (Nothing red or purple).  Continue to drink clear liquids until bedtime.  7.  Before going to bed, mix second dose of MoviPrep solution:    Empty 1 Pouch A and 1 Pouch B into the disposable container    Add lukewarm drinking water to the top line of the container. Mix to dissolve    Refrigerate  THE DAY OF YOUR PROCEDURE      DATE: 01/16/10  DAY: FRIDAY  Beginning at 5:30AM (5 hours before procedure):         1. Every 15 minutes, drink the solution down to the next mark (approx 8 oz) until the full liter is complete.  2. Follow completed prep with 16 oz. of clear liquid of your choice.    3. You may drink clear liquids until 8:30AM (2 HOURS BEFORE PROCEDURE).   MEDICATION INSTRUCTIONS  Unless otherwise instructed, you should take regular prescription medications with a small sip of water   as early as possible the morning  of your procedure.  Diabetic patients - see separate instructions.         OTHER INSTRUCTIONS  You will need a responsible adult at least 65 years of age to accompany you and drive you home.   This person must remain in the waiting room during your procedure.  Wear loose fitting clothing that is easily removed.  Leave jewelry and other valuables at home.  However, you may wish to bring a book to read or  an iPod/MP3 player to listen to music as you wait for your procedure to start.  Remove all body piercing jewelry and leave at home.  Total time from sign-in until discharge is approximately 2-3 hours.  You should go home directly after your procedure and rest.  You can resume normal activities the  day after your procedure.  The day of your procedure you should not:   Drive   Make legal decisions   Operate machinery   Drink alcohol   Return to work  You will receive specific instructions about eating, activities and medications before you leave.    The above instructions have been reviewed and explained to me by   Ernestine Conrad,  RN______________________    I fully understand and can verbalize these instructions _____________________________ Date _________

## 2010-05-12 NOTE — Letter (Signed)
Summary: Pre Visit Letter Revised  [facility]  [address] [place], [place] [address]   Phone: [phone]  Fax: [phone]        12/24/2009 [name]: [id number] [name] [address] [place], [place]  [address]             Procedure Date:  02-10-10   Welcome to the Gastroenterology Division at [facility].    You are scheduled to see a nurse for your pre-procedure visit on 01-27-10 at 9:00a.m. on the 3rd floor at [facility], [address]  We ask that you try to arrive at our office 15 minutes prior to your appointment time to allow for check-in.  Please take a minute to review the attached form.  If you answer "Yes" to one or more of the questions on the first page, we ask that you call the person listed at your earliest opportunity.  If you answer "No" to all of the questions, please complete the rest of the form and bring it to your appointment.    Your nurse visit will consist of discussing your medical and surgical history, your immediate family medical history, and your medications.   If you are unable to list all of your medications on the form, please bring the medication bottles to your appointment and we will list them.  We will need to be aware of both prescribed and over the counter drugs.  We will need to know exact dosage information as well.    Please be prepared to read and sign documents such as consent forms, a financial agreement, and acknowledgement forms.  If necessary, and with your consent, a friend or relative is welcome to sit-in on the nurse visit with you.  Please bring your insurance card so that we may make a copy of it.  If your insurance requires a referral to see a specialist, please bring your referral form from your primary care physician.  No co-pay is required for this nurse visit.     If you cannot keep your appointment, please call [phone] to cancel or reschedule prior to your appointment date.  This  allows [place] the opportunity to schedule an appointment for another patient in need of care.    Thank you for choosing [facility] for your medical needs.  We appreciate the opportunity to care for you.  Please visit [place] at our website  to learn more about our practice.  Sincerely, The Gastroenterology Division

## 2010-05-12 NOTE — Assessment & Plan Note (Signed)
Summary: SINUS SXS/ lb   Vital Signs:  Patient profile:   65 year old male Height:      72.75 inches Weight:      244.25 pounds BMI:     32.56 Temp:     98.1 degrees F oral Pulse rate:   72 / minute Pulse rhythm:   regular BP sitting:   100 / 60  (left arm) Cuff size:   regular  Vitals Entered By: Linde Gillis CMA [facility]) (Aug 15, 2009 3:01 PM) CC: ? bronchitis   Acute Visit History:      The patient complains of cough and nasal discharge.  These symptoms began 5 days ago.  He denies earache, fever, headache, nausea, sinus problems, and sore throat.  Other comments include: Has allergies  Former longterm >35 year history smoker. Using flonase and allergy meds. .        The patient notes shortness of breath.  The cough interferes with his sleep.  The character of the cough is described as yellow pheglm.  There is no history of wheezing associated with his cough.        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 2 Sprays Each Nostril Daily 2)  Banophen 25 Mg Caps (Diphenhydramine Hcl) .[name] Kitchen.. 1 As Needed 3)  Cardura 8 Mg Tabs (Doxazosin Mesylate) .[name] Kitchen.. 1 By Mouth At Bedtime 4)  Indomethacin 25 Mg Caps (Indomethacin) .... 2 Tablets Q 12 Hours As Needed For Gout Pain 5)  Simvastatin 40 Mg Tabs (Simvastatin) .[name] Kitchen.. 1 Daily By Mouth 6)  Atenolol 100 Mg Tabs (Atenolol) .[name] Kitchen.. 1 Daily By Mouth 7)  Lisinopril-Hydrochlorothiazide 20-12.5 Mg Tabs (Lisinopril-Hydrochlorothiazide) .[name] Kitchen.. 1 Daily By Mouth 8)  Effexor Xr 150 Mg Xr24h-Cap (Venlafaxine Hcl) .[name] Kitchen.. 1 Daily By Mouth 9)   Famotidine 10 Mg Tabs (Famotidine) .[name] Kitchen.. 1 Daily By Mouth 10)  Metformin Hcl 1000 Mg Tabs (Metformin Hcl) .[name] Kitchen.. 1 By Mouth Two Times A Day 11)  One Touch Test Strips .... Check Fasting Blood Sugar, and After Breakfast, Lunch, and Dinner (New Insulin, 250.00) 12)  Lantus 100 Unit/ml Soln (Insulin Glargine) .... 60 Units Subq Every Day As Directed 13)  Clarinex 5 Mg Tabs (Desloratadine) .[name] Kitchen.. 1 Once Daily For Congestion 14)  Humalog 100 Unit/ml  Soln (Insulin Lispro (Human)) .[name] Kitchen.. 15 Units Prior To Meals, Dispense 1 Month Supply 15)  Metronidazole 0.75 % Crea (Metronidazole) .... Apply Two Times A Day 16)  Azithromycin 250 Mg Tabs (Azithromycin) .... 2 Tab By Mouth X 1 Day, Then 1 Tab By Mouth Daily  Allergies: 1)  ! * Tetracyline  Past History:  Past medical, surgical, family and social histories (including risk factors) reviewed, and no changes noted (except as noted below).  Past Medical History: Reviewed history from 06/13/2008 and no changes required. Depression Diabetes mellitus, type II Hyperlipidemia Hypertension Osteoarthritis Allergic rhinitis GERD Gout  Former patient of Dr. Artis Flock Ortho = Dr. Darrelyn Hillock [facility])  Past Surgical History: Reviewed history from 06/13/2008 and no changes required. 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 P, DM S, Lung CA CAD, GF  DM, others  Social History: Reviewed history from 06/13/2008 and no changes required. Marital Status: Married Children:  Occupation: Science writer Former smoker Alcohol use-no Drug use-no Regular exercise-no  Physical Exam  General:  Well-developed,well-nourished,in no acute distress; alert,appropriate and cooperative throughout examination Head:  no maxillary sinus ttp  Ears:  no external deformities.   Nose:  nasal dischargemucosal pallor.   Mouth:  Oral mucosa and oropharynx  without lesions or exudates.  Teeth in good repair. Neck:  no carotid bruit or thyromegaly no cervical or supraclavicular lymphadenopathy  Lungs:  Normal respiratory effort, chest expands symmetrically. Lungs are clear to auscultation, no crackles or wheezes. Heart:  Normal rate and regular rhythm. S1 and S2 normal without gallop, murmur, click, rub or other extra sounds.   Impression & Recommendations:  Problem # 1:  BRONCHITIS- ACUTE (ICD-466.0) With smoking history and sputum color change..treat for bacterial source. USe mucinex as mucolytic. Continue allergy meds.  His updated medication list for this problem includes:    Azithromycin 250 Mg Tabs (Azithromycin) .[name] Kitchen... 2 tab by mouth x 1 day, then 1 tab by mouth daily  Complete Medication List: 1)  Flonase 50 Mcg/act Susp (Fluticasone propionate) .... 2 sprays each nostril daily 2)  Banophen 25 Mg Caps (Diphenhydramine hcl) .[name] Kitchen.. 1 as needed 3)  Cardura 8 Mg Tabs (Doxazosin mesylate) .[name] Kitchen.. 1 by mouth at bedtime 4)  Indomethacin 25 Mg Caps (Indomethacin) .... 2 tablets q 12 hours as needed for gout pain 5)  Simvastatin 40 Mg Tabs (Simvastatin) .[name] Kitchen.. 1 daily by mouth 6)  Atenolol 100 Mg Tabs (Atenolol) .[name] Kitchen.. 1 daily by mouth 7)  Lisinopril-hydrochlorothiazide 20-12.5 Mg Tabs (Lisinopril-hydrochlorothiazide) .[name] Kitchen.. 1 daily by mouth 8)  Effexor Xr 150 Mg Xr24h-cap (Venlafaxine hcl) .[name] Kitchen.. 1 daily by mouth 9)  Famotidine 10 Mg Tabs (Famotidine) .[name] Kitchen.. 1 daily by mouth 10)  Metformin Hcl 1000 Mg Tabs (Metformin hcl) .[name] Kitchen.. 1 by mouth two times a day 11)  One Touch Test Strips  .... Check fasting blood sugar, and after breakfast, lunch, and dinner (new insulin, 250.00) 12)  Lantus 100 Unit/ml Soln (Insulin glargine) .... 60 units subq every day as directed 13)  Clarinex 5 Mg Tabs (Desloratadine) .[name] Kitchen.. 1 once daily for congestion 14)  Humalog 100 Unit/ml Soln (Insulin lispro (human)) .[name] Kitchen.. 15 units prior to meals, dispense 1 month supply 15)  Metronidazole  0.75 % Crea (Metronidazole) .... Apply two times a day 16)  Azithromycin 250 Mg Tabs (Azithromycin) .... 2 tab by mouth x 1 day, then 1 tab by mouth daily  Patient Instructions: 1)  Start antibitoc and mucinex DM. 2)  Call if no improvement in3-4 days, sooner if increasing cough, fever, or new symptoms ( shortness of breath, chest pain) .  Prescriptions: AZITHROMYCIN 250 MG TABS (AZITHROMYCIN) 2 tab by mouth x 1 day, then 1 tab by mouth daily  #6 x 0   Entered and Authorized by:   Kerby Nora MD   Signed by:   Kerby Nora MD on 08/15/2009   Method used:   Electronically to        [facility]* (retail)       [address]       [place], [place]  [address]       Ph: [phone]       Fax: [phone]   RxID:   [id number]   Current Allergies (reviewed today): ! * TETRACYLINE

## 2010-05-12 NOTE — Letter (Signed)
Summary: Patient Notice-Hyperplastic Polyps CORRECTED  [facility]  [address]   [place], [place] [address]   Phone: [phone]  Fax: [phone]        January 29, 2010 [name]: [id number]    [name] [address] [place], [place]  [address]    Dear Mr. MASOUD,   I am pleased to inform you that the colon polyps removed during your recent colonoscopy were found to be hyperplastic.  These types of polyps are NOT pre-cancerous.  Based upon these findings and your overall history (no other true polyps since 1996), it is therefore my recommendation that you have a repeat colonoscopy examination in 10 years for routine colorectal cancer screening.  Should you develop new or worsening symptoms of abdominal pain, bowel habit changes or bleeding from the rectum or bowels, please schedule an evaluation with either your primary care physician or with me.  Please call us if you are having persistent problems or have questions about your condition that have not been fully answered at this time.   Sincerely,  Iva Boop MD, [facility] This letter has been electronically signed by your physician.  Appended Document: Patient Notice-Hyperplastic Polyps CORRECTED letter mailed

## 2010-05-12 NOTE — Assessment & Plan Note (Signed)
Summary: 1 MTH FU   STC   Vital Signs:  Patient profile:   65 year old male Height:      72 inches Weight:      243.50 pounds O2 Sat:      95 % on Room air Temp:     98.0 degrees F oral Pulse rate:   88 / minute BP sitting:   130 / 80  (left arm) Cuff size:   large  Vitals Entered By: Margaret Pyle, CMA (October 01, 2009 9:01 AM)  O2 Flow:  Room air CC: 3 mth f/u- DM   Referring Provider:  Hannah Beat MD Primary Provider:  Hannah Beat MD  CC:  3 mth f/u- DM.  History of Present Illness: no cbg record, but states cbg was low in the afternoon, after a smaller-than-expected lunch.  it is highest in am (higher than at hs, even if no hs-snack). it is seldom over 200.  pt states he feels well in general.  Current Medications (verified): 1)  Flonase 50 Mcg/act Susp (Fluticasone Propionate) .... 2 Sprays Each Nostril Daily 2)  Banophen 25 Mg Caps (Diphenhydramine Hcl) .[name] Kitchen.. 1 As Needed 3)  Cardura 8 Mg Tabs (Doxazosin Mesylate) .[name] Kitchen.. 1 By Mouth At Bedtime 4)  Indomethacin 25 Mg Caps (Indomethacin) .... 2 Tablets Q 12 Hours As Needed For Gout Pain 5)  Simvastatin 40 Mg Tabs (Simvastatin) .[name] Kitchen.. 1 Daily By Mouth 6)  Atenolol 100 Mg Tabs (Atenolol) .[name] Kitchen.. 1 Daily By Mouth 7)  Lisinopril-Hydrochlorothiazide 20-12.5 Mg Tabs (Lisinopril-Hydrochlorothiazide) .[name] Kitchen.. 1 Daily By Mouth 8)  Effexor Xr 150 Mg Xr24h-Cap (Venlafaxine Hcl) .[name] Kitchen.. 1 Daily By Mouth 9)  Famotidine 10 Mg Tabs (Famotidine) .[name] Kitchen.. 1 Daily By Mouth 10)  Metformin Hcl 1000 Mg Tabs (Metformin Hcl) .[name] Kitchen.. 1 By Mouth Two Times A Day 11)  One Touch Test Strips .... Check Fasting Blood Sugar, and After Breakfast, Lunch, and Dinner (New Insulin, 250.00) 12)  Lantus 100 Unit/ml Soln (Insulin Glargine) .... 50 Units At Bedtime 13)  Clarinex 5 Mg Tabs (Desloratadine) .[name] Kitchen.. 1 Once Daily For Congestion 14)  Humalog 100 Unit/ml  Soln (Insulin Lispro (Human)) .... 40 Units Three Times A Day (Just Before Each Meal), and Syringes 4x A  Day 15)  Metronidazole 0.75 % Crea (Metronidazole) .... Apply Two Times A Day  Allergies (verified): 1)  ! * Tetracyline  Past History:  Past Medical History: Last updated: 06/13/2008 Depression Diabetes mellitus, type II Hyperlipidemia Hypertension Osteoarthritis Allergic rhinitis GERD Gout  Former patient of Dr. Artis Flock Ortho = Dr. Darrelyn Hillock [facility])  Review of Systems  The patient denies syncope.    Physical Exam  General:  obese.  no distress  Psych:  Alert and cooperative; normal mood and affect; normal attention span and concentration.   Additional Exam:  fructosamine=304 (converts to a1c of 6.8)   Impression & Recommendations:  Problem # 1:  DIABETES MELLITUS, TYPE II (ICD-250.00) Assessment Improved  Medications Added to Medication List This Visit: 1)  Humalog 100 Unit/ml Soln (Insulin lispro (human)) .... Three times a day (just before each meal), 40-30-40 units, and syringes 4x a day  Other Orders: T-Fructosamine (04540-98119) Est. Patient Level III (14782)  Patient Instructions: 1)  check your blood sugar 2 times a day.  vary the time of day when you check, between before the 3 meals, and at bedtime.  also check if you have symptoms of your blood sugar being too high or too low.  please keep a record of the  readings and bring it to your next appointment here.  please call us sooner if you are having low blood sugar episodes. 2)  blood tests are being ordered for you today.  please call [phone] to hear your test results. 3)  decrease humalog to three times a day (just before each meal) 40-30-40 units.  however, take only 20 units if exertion is anticipated.   4)  take lantus to 50 units at night. 5)  Please schedule a follow-up appointment in 3 months 6)  (update: i left message on phone-tree:  rx as we discussed)

## 2010-05-12 NOTE — Assessment & Plan Note (Signed)
Summary: 15 MIN DIARRHEA AND BLOOD IN STOOL/DLO   Vital Signs:  Patient profile:   65 year old male Weight:      252.25 pounds Temp:     99.1 degrees F oral Pulse rate:   80 / minute Pulse rhythm:   regular BP sitting:   130 / 80  (left arm) Cuff size:   large  Vitals Entered By: Selena Batten Dance CMA [facility]) (December 24, 2009 8:41 AM) CC: Diarrhea and blood in stool   History of Present Illness: CC: diarrhea  about 1 mo h/o 1-3 x/wk diarrhea with blood in stool.  Has noted water in commode blood red, then clot on top of stool. Used to go every day 1 soft stool, now can go days without stool, then has diarrhea.  Diarrhea watery, very little formed stool.  No black tarry stools.  + yellow mucous.  Not more malodorous than usual.  No changes in food habits.  Pt doesn't think has hemorrhoids, was told had tear but healed.  No pain with stool.  Not straining often.  So-so fiber.  Doesn't drink much water.  no n/v.  + weight gain.  No abd pain with stooling.  Doesn't feel abd distended.  No recent travel, no sick contacts, no diarrhea.  Uses well water.  Has puppy at home, no diarrhea.  Daughter with crohn's.  h/o colonoscopy 2006, had 12 polyps removed, told no cancer, told to return in 5 years so due.  Burning up as well (subjective fever) but doesn't have fever.  Temp has been 99ish.  Also feeling really depressed.  Takes effexor, has been on for several years.  Feels like needs to sleep, rundown, anhedonia.  No appetite changes.  No SI/HI.  would like increase in dose of effexor.  attributes worsening of depression because sister with COPD not doing too well.  Current Medications (verified): 1)  Flonase 50 Mcg/act Susp (Fluticasone Propionate) .... 2 Sprays Each Nostril Daily 2)  Banophen 25 Mg Caps (Diphenhydramine Hcl) .[name] Kitchen.. 1 As Needed 3)  Cardura 8 Mg Tabs (Doxazosin Mesylate) .[name] Kitchen.. 1 By Mouth At Bedtime 4)  Indomethacin 25 Mg Caps (Indomethacin) .... 2 Tablets Q 12 Hours As Needed For  Gout Pain 5)  Simvastatin 40 Mg Tabs (Simvastatin) .[name] Kitchen.. 1 Daily By Mouth 6)  Atenolol 100 Mg Tabs (Atenolol) .[name] Kitchen.. 1 Daily By Mouth 7)  Lisinopril-Hydrochlorothiazide 20-12.5 Mg Tabs (Lisinopril-Hydrochlorothiazide) .[name] Kitchen.. 1 Daily By Mouth 8)  Effexor Xr 150 Mg Xr24h-Cap (Venlafaxine Hcl) .[name] Kitchen.. 1 Daily By Mouth 9)  Famotidine 10 Mg Tabs (Famotidine) .[name] Kitchen.. 1 Daily By Mouth 10)  Metformin Hcl 1000 Mg Tabs (Metformin Hcl) .[name] Kitchen.. 1 By Mouth Two Times A Day 11)  Onetouch Ultra Test  Strp (Glucose Blood) .... Check Fasting Blood Sugar and After Breakfast, Lunch and Dinner 12)  Lantus 100 Unit/ml Soln (Insulin Glargine) .... 50 Units At Bedtime 13)  Clarinex 5 Mg Tabs (Desloratadine) .[name] Kitchen.. 1 Once Daily For Congestion 14)  Humalog 100 Unit/ml  Soln (Insulin Lispro (Human)) .... Three Times A Day (Just Before Each Meal), 40-30-40 Units, and Syringes 4x A Day 15)  Metronidazole 0.75 % Crea (Metronidazole) .... Apply Two Times A Day 16)  Vitamin D 1000 Unit Tabs (Cholecalciferol) .[name] Kitchen.. 1 By Mouth Once Daily 17)  B Complex  Tabs (B Complex Vitamins) .[name] Kitchen.. 1 By Mouth Two Times A Day  Allergies: 1)  ! * Tetracyline  Past History:  Past Medical History: Last updated: 06/13/2008 Depression Diabetes mellitus, type II Hyperlipidemia  Hypertension Osteoarthritis Allergic rhinitis GERD Gout  Former patient of Dr. Artis Flock Ortho = Dr. 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 Lungs:  Normal respiratory effort, chest expands symmetrically. Lungs are clear to auscultation, no crackles or wheezes. Heart:  Normal rate and regular rhythm. S1 and S2 normal without gallop,  murmur, click, rub or other extra sounds. Abdomen:  hyperactive BS, soft, + sign distended throughout, minimally tender, no masses appreciated, no HSM, no rebound/guarding. Rectal:  + ext hemorrhoids noninflammed, no tears noted.  good tone.  + tender to palpation internal left vault, no stool in vault.  did not appreciate int hemorrhoids.  guaiac positive Prostate:  Prostate gland firm and smooth, no enlargement, nodularity, tenderness, mass, asymmetry or induration. Pulses:  2+ rad pulses Skin:  Intact without suspicious lesions or rashes   Impression & Recommendations:  Problem # 1:  DIARRHEA (ICD-787.91) infectious vs more ominous cause.  check lytes to ensure not becoming deydrated although clinically looks well hydrated.  Check CBC to eval infection, blood loss.  Check stool for giardia and culture/WBC.  will not send O&P as no recent travel, sick contacts.  Given h/o polyps, prudent to send back to GI for rpt colonoscopy.  Orders: TLB-TSH (Thyroid Stimulating Hormone) (84443-TSH) TLB-BMP (Basic Metabolic Panel-BMET) (80048-METABOL) TLB-CBC Platelet - w/Differential (85025-CBCD) T-Stool Giardia / Crypto- EIA (14782) T-Culture, Stool ([id number]) T- * Misc. Laboratory test [phone]) Specimen Handling (30865) Gastroenterology Referral (GI)  Problem # 2:  DEPRESSION (ICD-311) Assessment: Deteriorated per patient worsening of depression.  would like trial of increased dose of effexor, asks for generic version.  f/u with PCP in 3-4 wks.  His updated medication list for this problem includes:    Venlafaxine Hcl 225 Mg Xr24h-tab (Venlafaxine hcl) ..... One daily for depression  Problem # 3:  DIABETES MELLITUS, TYPE II (ICD-250.00) check A1c as due.  His updated medication list for this problem includes:    Lisinopril-hydrochlorothiazide 20-12.5 Mg Tabs (Lisinopril-hydrochlorothiazide) .[name] Kitchen... 1 daily by mouth    Metformin Hcl 1000 Mg Tabs (Metformin hcl) .[name] Kitchen... 1 by mouth two  times a day    Lantus 100 Unit/ml Soln (Insulin glargine) .[name] KitchenMarland KitchenMarland KitchenMarland Kitchen 50 units at bedtime    Humalog 100 Unit/ml Soln (Insulin lispro (human)) .[name] Kitchen... Three times a day (just before each meal), 40-30-40 units, and syringes 4x a day  Orders: TLB-A1C / Hgb A1C (Glycohemoglobin) (83036-A1C) TLB-BMP (Basic Metabolic Panel-BMET) (80048-METABOL)  Labs Reviewed: Creat: 1.4 (04/24/2009)    Reviewed HgBA1c results: 9.3 (07/22/2009)  9.8 (04/24/2009)  Problem # 4:  SPECIAL SCREENING MALIGNANT NEOPLASM OF PROSTATE (ICD-V76.44) requests PSA, DRE reassuring  Orders: TLB-PSA (Prostate Specific Antigen) (84153-PSA)  Complete Medication List: 1)  Flonase 50 Mcg/act Susp (Fluticasone propionate) .... 2 sprays each nostril daily 2)  Banophen 25 Mg Caps (Diphenhydramine hcl) .[name] Kitchen.. 1 as needed 3)  Cardura 8 Mg Tabs (Doxazosin mesylate) .[name] Kitchen.. 1 by mouth at bedtime 4)  Indomethacin 25 Mg Caps (Indomethacin) .... 2 tablets q 12 hours as needed for gout pain 5)  Simvastatin 40 Mg Tabs (Simvastatin) .[name] Kitchen.. 1 daily by mouth 6)  Atenolol 100 Mg Tabs (Atenolol) .[name] Kitchen.. 1 daily by mouth 7)  Lisinopril-hydrochlorothiazide 20-12.5 Mg Tabs (Lisinopril-hydrochlorothiazide) .[name] Kitchen.. 1 daily by mouth 8)  Venlafaxine Hcl 225 Mg Xr24h-tab (Venlafaxine hcl) .... One daily for depression 9)  Famotidine 10 Mg Tabs (Famotidine) .[name] Kitchen.. 1 daily by mouth 10)  Metformin Hcl 1000 Mg Tabs (Metformin hcl) .[name] Kitchen.. 1 by mouth two times a day 11)  Onetouch Ultra Test Strp (Glucose blood) .... Check fasting blood sugar and after breakfast, lunch and dinner 12)  Lantus 100 Unit/ml Soln (Insulin glargine) .... 50 units at bedtime 13)  Clarinex 5 Mg Tabs (Desloratadine) .[name] Kitchen.. 1 once daily for congestion 14)  Humalog 100 Unit/ml Soln (Insulin lispro (human)) .... Three times a day (just before each meal), 40-30-40 units, and syringes 4x a day 15)  Metronidazole 0.75 % Crea (Metronidazole) .... Apply two times a day 16)  Vitamin D 1000 Unit Tabs  (Cholecalciferol) .[name] Kitchen.. 1 by mouth once daily 17)  B Complex Tabs (B complex vitamins) .[name] Kitchen.. 1 by mouth two times a day  Patient Instructions: 1)  Increase effexor (venlafaxine) to 225mg  daily, follow up with Dr. Patsy Lager in 3-4 weeks. 2)  This could be an infection so we will draw blood today as well as send for stool study to evaluate for infection.  however it's prudent to repeat your colonoscopy as you have history of polyps and are due. 3)  Pass by Marion's office for referral to GI 4)  Pleasure to meet you today.  Call clinic with questions. Prescriptions: VENLAFAXINE HCL 225 MG XR24H-TAB (VENLAFAXINE HCL) one daily for depression  #30 x 2   Entered and Authorized by:   Eustaquio Boyden  MD   Signed by:   Eustaquio Boyden  MD on 12/24/2009   Method used:   Electronically to        [facility]* (retail)       [address]       [place], [place]  [address]       Ph: [phone]       Fax: [phone]   RxID:   [id number]   Current Allergies (reviewed today): ! * TETRACYLINE

## 2010-05-12 NOTE — Progress Notes (Signed)
Summary: update  ---- Converted from flag ---- ---- 01/02/2010 10:24 AM, Dustin Boyden  MD wrote: can we call pt and see how he's doing?  if still with changes in bowel habits, I'd like to schedule sooner GI appt. ------------------------------ Spoke with patient and notified of his lab results. he said he is not having as much diarrhea but he is still having blood in his stools. I advised him that we were going to try to get him into GI sooner. He was appreciative. I instructed him to call or come back if he had any questions, concerns or problems. He verbalized undertanding. Kim Dance CMA [facility])  January 02, 2010 10:31 AM   thank you. Dustin Boyden  MD  January 02, 2010 12:31 PM

## 2010-05-12 NOTE — Progress Notes (Signed)
Summary: Clindamycin Cream  Phone Note Refill Request Message from:  Fax from Pharmacy on July 28, 2009 10:51 AM  Refills Requested: Medication #1:  CLINDAMYCIN PHOSPHATE 2 % CREA Apply two times a day. [facility] says it only comes in vaginal cream.  Request sent to [facility]     Method Requested: Electronic Initial call taken by: Delilah Shan CMA [facility]),  July 28, 2009 10:53 AM  Follow-up for Phone Call        Rx called to pharmacy Follow-up by: Benny Lennert CMA [facility]),  July 28, 2009 11:15 AM    New/Updated Medications: METRONIDAZOLE 0.75 % CREA (METRONIDAZOLE) Apply two times a day Prescriptions: METRONIDAZOLE 0.75 % CREA (METRONIDAZOLE) Apply two times a day  #45 g x 2   Entered and Authorized by:   Hannah Beat MD   Signed by:   Hannah Beat MD on 07/28/2009   Method used:   Telephoned to ...       [facility]* (retail)       [address]       [place], [place]  [address]       Ph: [phone]       Fax: [phone]   RxID:   [id number]

## 2010-05-12 NOTE — Assessment & Plan Note (Signed)
Summary: 2 WK FU  STC   Vital Signs:  Patient profile:   65 year old male Height:      72.75 inches (184.79 cm) Weight:      247.25 pounds (112.39 kg) O2 Sat:      92 % on Room air Temp:     97.4 degrees F (36.33 degrees C) oral Pulse rate:   86 / minute BP sitting:   152 / 90  (left arm) Cuff size:   large  Vitals Entered By: Josph Macho RMA (Sep 01, 2009 1:45 PM)  O2 Flow:  Room air CC: 2 week follow up/ pt is no longer taking Azithromycin/ CF Is Patient Diabetic? Yes   Referring Provider:  Hannah Beat MD Primary Provider:  Hannah Beat MD  CC:  2 week follow up/ pt is no longer taking Azithromycin/ CF.  History of Present Illness: no cbg record, but states cbg's vary from 110 (before breakfast) to 175 (afternoon).  however, he had an episode of mild hypoglycemia in the afternoon, after yard work.  Current Medications (verified): 1)  Flonase 50 Mcg/act Susp (Fluticasone Propionate) .... 2 Sprays Each Nostril Daily 2)  Banophen 25 Mg Caps (Diphenhydramine Hcl) .[name] Kitchen.. 1 As Needed 3)  Cardura 8 Mg Tabs (Doxazosin Mesylate) .[name] Kitchen.. 1 By Mouth At Bedtime 4)  Indomethacin 25 Mg Caps (Indomethacin) .... 2 Tablets Q 12 Hours As Needed For Gout Pain 5)  Simvastatin 40 Mg Tabs (Simvastatin) .[name] Kitchen.. 1 Daily By Mouth 6)  Atenolol 100 Mg Tabs (Atenolol) .[name] Kitchen.. 1 Daily By Mouth 7)  Lisinopril-Hydrochlorothiazide 20-12.5 Mg Tabs (Lisinopril-Hydrochlorothiazide) .[name] Kitchen.. 1 Daily By Mouth 8)  Effexor Xr 150 Mg Xr24h-Cap (Venlafaxine Hcl) .[name] Kitchen.. 1 Daily By Mouth 9)  Famotidine 10 Mg Tabs (Famotidine) .[name] Kitchen.. 1 Daily By Mouth 10)  Metformin Hcl 1000 Mg Tabs (Metformin Hcl) .[name] Kitchen.. 1 By Mouth Two Times A Day 11)  One Touch Test Strips .... Check Fasting Blood Sugar, and After Breakfast, Lunch, and Dinner (New Insulin, 250.00) 12)  Lantus 100 Unit/ml Soln (Insulin Glargine) .... 60 Units Subq Every Day As Directed 13)  Clarinex 5 Mg Tabs (Desloratadine) .[name] Kitchen.. 1 Once Daily For Congestion 14)  Humalog 100  Unit/ml  Soln (Insulin Lispro (Human)) .... 30 Units Three Times A Day (Just Before Each Meal), and Syringes 4x A Day 15)  Metronidazole 0.75 % Crea (Metronidazole) .... Apply Two Times A Day 16)  Azithromycin 250 Mg Tabs (Azithromycin) .... 2 Tab By Mouth X 1 Day, Then 1 Tab By Mouth Daily  Allergies (verified): 1)  ! * Tetracyline  Past History:  Past Medical History: Last updated: 06/13/2008 Depression Diabetes mellitus, type II Hyperlipidemia Hypertension Osteoarthritis Allergic rhinitis GERD Gout  Former patient of Dr. Artis Flock Ortho = Dr. Darrelyn Hillock [facility])  Physical Exam  General:  morbidly obese.  no distress  Skin:  insulin injection sites at the triceps areas, are normal    Impression & Recommendations:  Problem # 1:  DIABETES MELLITUS, TYPE II (ICD-250.00) he needs some adjustment in his therapy  Medications Added to Medication List This Visit: 1)  Lantus 100 Unit/ml Soln (Insulin glargine) .... 50 units at bedtime 2)  Humalog 100 Unit/ml Soln (Insulin lispro (human)) .... 40 units three times a day (just before each meal), and syringes 4x a day  Other Orders: Est. Patient Level III (16109)  Patient Instructions: 1)  check your blood sugar 2 times a day.  vary the time of day when you check, between before the 3  meals, and at bedtime.  also check if you have symptoms of your blood sugar being too high or too low.  please keep a record of the readings and bring it to your next appointment here.  please call us sooner if you are having low blood sugar episodes. 2)  we will need to take this complex situation in stages 3)  for now, increase humalog to 40 units three times a day (just before each meal).  however, take only 20 units if exertion is anticipated.   4)  reduce lantus to 50 units at night. 5)  Please schedule a follow-up appointment in 1 month.

## 2010-05-12 NOTE — Letter (Signed)
Summary: Diabetic Instructions  [facility]  [address]   [place], [place] [address]   Phone: [phone]  Fax: [phone]    [name] 06/08/1945 [name]: [id number]   _  _   ORAL DIABETIC MEDICATION INSTRUCTIONS  The day before your procedure:   Take your diabetic pill as you do normally  The day of your procedure:   Do not take your diabetic pill    We will check your blood sugar levels during the admission process and again in Recovery before discharging you home  ________________________________________________________________________  _  _   INSULIN (LONG ACTING) MEDICATION INSTRUCTIONS (Lantus, NPH, 70/30, Humulin, Novolin-N)   The day before your procedure:   Take  your regular evening dose    The day of your procedure:   Do not take your morning dose    _  _   INSULIN (SHORT ACTING) MEDICATION INSTRUCTIONS (Regular, Humulog, Novolog)   The day before your procedure:   Do not take your evening dose   The day of your procedure:   Do not take your morning dose   _  _   INSULIN PUMP MEDICATION INSTRUCTIONS  We will contact the physician managing your diabetic care for written dosage instructions for the day before your procedure and the day of your procedure.  Once we have received the instructions, we will contact you. 

## 2010-05-12 NOTE — Letter (Signed)
Summary: Colonoscopy Letter  [facility]  [address] [place], [place] [address]   Phone: [phone]  Fax: [phone]      June 06, 2009 [name]: [id number]   [name] [address] [place], [place]  [address]   Dear Mr. MORAVEK,   According to your medical record, it is time for you to schedule a Colonoscopy. The American Cancer Society recommends this procedure as a method to detect early colon cancer. Patients with a family history of colon cancer, or a personal history of colon polyps or inflammatory bowel disease are at increased risk.  This letter has beeen generated based on the recommendations made at the time of your procedure. If you feel that in your particular situation this may no longer apply, please contact our office.  Please call our office at [phone] to schedule this appointment or to update your records at your earliest convenience.  Thank you for cooperating with [place] to provide you with the very best care possible.   Sincerely,  Iva Boop, M.D.  [facility] [phone]

## 2010-05-12 NOTE — Assessment & Plan Note (Signed)
Summary: new endo consult/[facility]/diabetes/#/cd   Vital Signs:  Patient profile:   65 year old male Height:      72.75 inches (184.79 cm) Weight:      246 pounds (111.82 kg) O2 Sat:      93 % on Room air Temp:     99.9 degrees F (37.72 degrees C) oral Pulse rate:   86 / minute BP sitting:   138 / 68  (left arm) Cuff size:   large  Vitals Entered By: Josph Macho RMA (Aug 18, 2009 2:09 PM)  O2 Flow:  Room air CC: New Endo: Diabetes/ CF Is Patient Diabetic? Yes   Referring Provider:  Hannah Beat MD Primary Provider:  Hannah Beat MD  CC:  New Endo: Diabetes/ CF.  History of Present Illness: pt states 15 years h/o dm.  he denies knowing of any chronic complications.  he has been on insulin x 1 year.  he takes lantus and humalog.  no cbg record, but states cbg's are consistently over 200, at all times of day.   pt says his diet is and exercise are not good.   symptomatically, pt states 1 year of moderate right hip pain, but no associated numbness.  hip replacement did not help.     Current Medications (verified): 1)  Flonase 50 Mcg/act Susp (Fluticasone Propionate) .... 2 Sprays Each Nostril Daily 2)  Banophen 25 Mg Caps (Diphenhydramine Hcl) .[name] Kitchen.. 1 As Needed 3)  Cardura 8 Mg Tabs (Doxazosin Mesylate) .[name] Kitchen.. 1 By Mouth At Bedtime 4)  Indomethacin 25 Mg Caps (Indomethacin) .... 2 Tablets Q 12 Hours As Needed For Gout Pain 5)  Simvastatin 40 Mg Tabs (Simvastatin) .[name] Kitchen.. 1 Daily By Mouth 6)  Atenolol 100 Mg Tabs (Atenolol) .[name] Kitchen.. 1 Daily By Mouth 7)  Lisinopril-Hydrochlorothiazide 20-12.5 Mg Tabs (Lisinopril-Hydrochlorothiazide) .[name] Kitchen.. 1 Daily By Mouth 8)  Effexor Xr 150 Mg Xr24h-Cap (Venlafaxine Hcl) .[name] Kitchen.. 1 Daily By Mouth 9)  Famotidine 10 Mg Tabs (Famotidine) .[name] Kitchen.. 1 Daily By Mouth 10)  Metformin Hcl 1000 Mg Tabs (Metformin Hcl) .[name] Kitchen.. 1 By Mouth Two Times A Day 11)  One Touch Test Strips .... Check Fasting Blood Sugar, and After Breakfast, Lunch, and Dinner (New Insulin,  250.00) 12)  Lantus 100 Unit/ml Soln (Insulin Glargine) .... 60 Units Subq Every Day As Directed 13)  Clarinex 5 Mg Tabs (Desloratadine) .[name] Kitchen.. 1 Once Daily For Congestion 14)  Humalog 100 Unit/ml  Soln (Insulin Lispro (Human)) .[name] Kitchen.. 15 Units Prior To Meals, Dispense 1 Month Supply 15)  Metronidazole 0.75 % Crea (Metronidazole) .... Apply Two Times A Day 16)  Azithromycin 250 Mg Tabs (Azithromycin) .... 2 Tab By Mouth X 1 Day, Then 1 Tab By Mouth Daily  Allergies (verified): 1)  ! * Tetracyline  Past History:  Past Medical History: Last updated: 06/13/2008 Depression Diabetes mellitus, type II Hyperlipidemia Hypertension Osteoarthritis Allergic rhinitis GERD Gout  Former patient of Dr. Artis Flock Ortho = Dr. Darrelyn Hillock [facility])  Family History: Reviewed history from 06/13/2008 and no changes required. P, DM S, Lung CA CAD, GF  DM: father  Social History: Reviewed history from 06/13/2008 and no changes required. Marital Status: Married Children:  Occupation: Science writer Former smoker Alcohol use-no Drug use-no Regular exercise-no  Review of Systems       The patient complains of weight gain.         denies blurry vision, headache, chest pain, sob, n/v, urinary frequency, cramps, excessive diaphoresis, memory loss, hypoglycemia, and easy bruising.  medication helps depression  sxs.  he has intermittent rhinorrhea.  he has chronic daytime somnolence.   Physical Exam  General:  obese.  no distress  Head:  head: no deformity eyes: no periorbital swelling, no proptosis external nose and ears are normal mouth: no lesion seen Neck:  Supple without thyroid enlargement or tenderness.  Lungs:  Clear to auscultation bilaterally. Normal respiratory effort.  Heart:  Regular rate and rhythm without murmurs or gallops noted. Normal S1,S2.   Abdomen:  abdomen is soft, nontender.  no hepatosplenomegaly.   not distended.  no hernia  Msk:  muscle bulk and strength are  grossly normal.  no obvious joint swelling.  gait is normal and steady  Pulses:  dorsalis pedis intact bilat.  no carotid bruit  Extremities:  no deformity.  no ulcer on the feet.  feet are of normal color and temp.    there is patchy rust-colored discoloration of the left leg. there are bilateral varicosities.1+ right pedal edema, 1+ left pedal edema, and mycotic toenails.   Neurologic:  cn 2-12 grossly intact.   readily moves all 4's.   sensation is intact to touch on the feet  Skin:  normal texture and temp.  no rash.  not diaphoretic  Cervical Nodes:  No significant adenopathy.  Psych:  Alert and cooperative; normal mood and affect; normal attention span and concentration.     Impression & Recommendations:  Problem # 1:  DIABETES MELLITUS, TYPE II (ICD-250.00) needs increased rx  Problem # 2:  somnolence ? sleep apnea  Problem # 3:  OSTEOARTHRITIS (ICD-715.90) this limits exercise rx of #1  Medications Added to Medication List This Visit: 1)  Humalog 100 Unit/ml Soln (Insulin lispro (human)) .... 30 units three times a day (just before each meal), and syringes 4x a day  Other Orders: TLB-TSH (Thyroid Stimulating Hormone) (84443-TSH) Consultation Level IV (54098)  Patient Instructions: 1)  good diet and exercise habits significanly improve the control of your diabetes.  please let me know if you wish to be referred to a dietician.  high blood sugar is very risky to your health.  you should see an eye doctor every year. 2)  controlling your blood pressure and cholesterol drastically reduces the damage diabetes does to your body.  this also applies to quitting smoking.  please discuss these with your doctor.  you should take an aspirin every day, unless you have been advised by a doctor not to. 3)  check your blood sugar 2 times a day.  vary the time of day when you check, between before the 3 meals, and at bedtime.  also check if you have symptoms of your blood sugar being too  high or too low.  please keep a record of the readings and bring it to your next appointment here.  please call us sooner if you are having low blood sugar episodes. 4)  we will need to take this complex situation in stages 5)  for now, increase humalog to 30 units three times a day (just before each meal), and continue same lantus. 6)  you should see a sleep specialist re: the somnolence symptoms.  let me know if you would like to have an appointment. 7)  Please schedule a follow-up appointment in 2 weeks. Prescriptions: HUMALOG 100 UNIT/ML  SOLN (INSULIN LISPRO (HUMAN)) 30 units three times a day (just before each meal), and syringes 4x a day  #3 vials x 11   Entered and Authorized by:   Minus Breeding MD  Signed by:   Minus Breeding MD on 08/18/2009   Method used:   Electronically to        [facility]* (retail)       [address]       [place], [place]  [address]       Ph: [phone]       Fax: [phone]   RxID:   [id number]

## 2010-05-12 NOTE — Assessment & Plan Note (Signed)
Summary: 3 M F/U DLO   Vital Signs:  Patient profile:   65 year old male Height:      72.75 inches Weight:      242.2 pounds BMI:     32.29 Temp:     98.2 degrees F oral Pulse rate:   72 / minute Pulse rhythm:   regular BP sitting:   120 / 70  (left arm) Cuff size:   regular  Vitals Entered By: Benny Lennert CMA Duncan Dull) (May 01, 2009 8:03 AM)  History of Present Illness: Chief complaint 3 month follow up  60 units units of Lantus  Checking once a day  Gioffre - questions hip  Diabetes Management History:      He has not been enrolled in the "Diabetic Education Program".  He states understanding of dietary principles but he is not following the appropriate diet.  No sensory loss is reported.  Self foot exams are being performed.  He is checking home blood sugars.  He says that he is not exercising regularly.        Hypoglycemic symptoms are not occurring.  No hyperglycemic symptoms are reported.        There are no symptoms to suggest diabetic complications.  The following changes have been made to his treatment plan since last visit: insulin dosing.  Treatment plan changes were initiated by patient and initiated by MD.  Dietary compliance is reported to be poor.    Hypertension History:      He denies headache, chest pain, palpitations, dyspnea with exertion, orthopnea, PND, peripheral edema, visual symptoms, neurologic problems, syncope, and side effects from treatment.        Positive major cardiovascular risk factors include male age 35 years old or older, diabetes, hyperlipidemia, and hypertension.  Negative major cardiovascular risk factors include negative family history for ischemic heart disease and non-tobacco-user status.        Further assessment for target organ damage reveals no history of ASHD, cardiac end-organ damage (CHF/LVH), stroke/TIA, peripheral vascular disease, renal insufficiency, or hypertensive retinopathy.    Lipid Management History:      Positive  NCEP/ATP III risk factors include male age 61 years old or older, diabetes, HDL cholesterol less than 40, and hypertension.  Negative NCEP/ATP III risk factors include no family history for ischemic heart disease, non-tobacco-user status, no ASHD (atherosclerotic heart disease), no prior stroke/TIA, no peripheral vascular disease, and no history of aortic aneurysm.        The patient states that he knows about the "Therapeutic Lifestyle Change" diet.  His compliance with the TLC diet is poor.  The patient expresses understanding of adjunctive measures for cholesterol lowering.  Adjunctive measures started by the patient include aerobic exercise, fiber, ASA, omega-3 supplements, sitostanol esters, limit alcohol consumpton, and weight reduction.  He expresses no side effects from his lipid-lowering medication.    Clinical Review Panels:  Prevention   Last Colonoscopy:  normal (04/12/2004)  Immunizations   Last Tetanus Booster:  given (04/13/2007)   Last Flu Vaccine:  given (01/27/2009)   Last Pneumovax:  Pneumovax (10/25/2008)  Lipid Management   Cholesterol:  120 (04/24/2009)   LDL (bad choesterol):  43 (04/24/2009)   HDL (good cholesterol):  31.90 (04/24/2009)  Diabetes Management   HgBA1C:  9.8 (04/24/2009)   Creatinine:  1.4 (04/24/2009)   Last Foot Exam:  yes (05/01/2009)   Last Flu Vaccine:  given (01/27/2009)   Last Pneumovax:  Pneumovax (10/25/2008)  CBC   WBC:  4.0 (08/28/2008)   RBC:  3.59 (08/28/2008)   Hgb:  11.2 (08/28/2008)   Hct:  32.5 (08/28/2008)   Platelets:  148.0 (08/28/2008)   MCV  90.6 (08/28/2008)   MCHC  34.4 (08/28/2008)   RDW  12.2 (08/28/2008)   PMN:  55.1 (08/28/2008)   Lymphs:  30.5 (08/28/2008)   Monos:  13.0 (08/28/2008)   Eosinophils:  1.4 (08/28/2008)   Basophil:  0.0 (08/28/2008)  Complete Metabolic Panel   Glucose:  218 (04/24/2009)   Sodium:  132 (04/24/2009)   Potassium:  4.1 (04/24/2009)   Chloride:  97 (04/24/2009)   CO2:  28  (04/24/2009)   BUN:  23 (04/24/2009)   Creatinine:  1.4 (04/24/2009)   Albumin:  3.9 (04/24/2009)   Total Protein:  7.0 (04/24/2009)   Calcium:  8.7 (04/24/2009)   Total Bili:  0.8 (04/24/2009)   Alk Phos:  69 (04/24/2009)   SGPT (ALT):  23 (04/24/2009)   SGOT (AST):  19 (04/24/2009)   Allergies: 1)  ! * Tetracyline  Past History:  Past medical, surgical, family and social histories (including risk factors) reviewed, and no changes noted (except as noted below).  Past Medical History: Reviewed history from 06/13/2008 and no changes required. Depression Diabetes mellitus, type II Hyperlipidemia Hypertension Osteoarthritis Allergic rhinitis GERD Gout  Former patient of Dr. Artis Flock Ortho = Dr. Darrelyn Hillock [facility])  Past Surgical History: Reviewed history from 06/13/2008 and no changes required. 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 P, DM S, Lung CA CAD, GF  DM, others  Social History: Reviewed history from 06/13/2008 and no changes required. Marital Status: Married Children:  Occupation: Science writer Former smoker Alcohol use-no Drug use-no Regular exercise-no  Review of Systems       ROS: GEN: No acute illnesses, no fevers, chills, sweats, fatigue, weight loss, or URI sx. GI: No n/v/d Pulm: No SOB, cough, wheezing Interactive and getting along well at home.  Otherwise, ROS is as per the HPI.   Physical Exam  Additional Exam:  GEN: WDWN, NAD, Non-toxic, A & O x 3 HEENT: Atraumatic, Normocephalic. Neck supple. No masses, No LAD. Ears and Nose: No external deformity. CV: RRR, No M/G/R. No JVD. No thrill. No extra heart sounds. PULM: CTA B, no wheezes, crackles, rhonchi. No retractions. No resp. distress. No accessory muscle use. EXTR: No c/c/e NEURO: Normal gait.  PSYCH: Normally interactive. Conversant. Not depressed or anxious  appearing.  Calm demeanor.    Diabetes Management Exam:    Foot Exam (with socks and/or shoes not present):       Sensory-Pinprick/Light touch:          Left medial foot (L-4): normal          Left dorsal foot (L-5): normal          Left lateral foot (S-1): normal          Right medial foot (L-4): normal          Right dorsal foot (L-5): normal          Right lateral foot (S-1): normal       Sensory-Monofilament:          Left foot: normal          Right foot: normal       Inspection:          Left foot: normal  Right foot: normal       Nails:          Left foot: normal          Right foot: normal   Impression & Recommendations:  Problem # 1:  DIABETES MELLITUS, TYPE II (ICD-250.00) Assessment Deteriorated Not at goal, unstable  Add humalog and will titrate up  His updated medication list for this problem includes:    Lisinopril-hydrochlorothiazide 20-12.5 Mg Tabs (Lisinopril-hydrochlorothiazide) .[name] Kitchen... 1 daily by mouth    Metformin Hcl 1000 Mg Tabs (Metformin hcl) .[name] Kitchen... 1 by mouth two times a day    Lantus 100 Unit/ml Soln (Insulin glargine) .[name] KitchenMarland KitchenMarland KitchenMarland Kitchen 60 units subq every day as directed    Humalog 100 Unit/ml Soln (Insulin lispro (human)) .[name] KitchenMarland KitchenMarland KitchenMarland Kitchen 4 units prior to meals, dispense 1 month supply  Problem # 2:  HYPERTENSION (ICD-401.9)  His updated medication list for this problem includes:    Cardura 8 Mg Tabs (Doxazosin mesylate) .[name] Kitchen... 1 by mouth at bedtime    Atenolol 100 Mg Tabs (Atenolol) .[name] Kitchen... 1 daily by mouth    Lisinopril-hydrochlorothiazide 20-12.5 Mg Tabs (Lisinopril-hydrochlorothiazide) .[name] Kitchen... 1 daily by mouth  Problem # 3:  HYPERLIPIDEMIA (ICD-272.4)  His updated medication list for this problem includes:    Simvastatin 40 Mg Tabs (Simvastatin) .[name] Kitchen... 1 daily by mouth  Labs Reviewed: SGOT: 19 (04/24/2009)   SGPT: 23 (04/24/2009)  Lipid Goals: Chol Goal: 200 (05/01/2009)   HDL Goal: 40 (05/01/2009)   LDL Goal: 100 (05/01/2009)   TG Goal: 150  (05/01/2009)  10 Yr Risk Heart Disease: 14 % Prior 10 Yr Risk Heart Disease: Not enough information (09/03/2008)   HDL:31.90 (04/24/2009), 30.40 (08/28/2008)  LDL:43 (04/24/2009)  Chol:120 (04/24/2009), 153 (08/28/2008)  Trig:395.0 (04/24/2009), 366.0 (08/28/2008)  Complete Medication List: 1)  Flonase 50 Mcg/act Susp (Fluticasone propionate) .... 2 sprays each nostril daily 2)  Banophen 25 Mg Caps (Diphenhydramine hcl) .[name] Kitchen.. 1 as needed 3)  Cardura 8 Mg Tabs (Doxazosin mesylate) .[name] Kitchen.. 1 by mouth at bedtime 4)  Indomethacin 25 Mg Caps (Indomethacin) .... 2 tablets q 12 hours as needed for gout pain 5)  Simvastatin 40 Mg Tabs (Simvastatin) .[name] Kitchen.. 1 daily by mouth 6)  Atenolol 100 Mg Tabs (Atenolol) .[name] Kitchen.. 1 daily by mouth 7)  Lisinopril-hydrochlorothiazide 20-12.5 Mg Tabs (Lisinopril-hydrochlorothiazide) .[name] Kitchen.. 1 daily by mouth 8)  Effexor Xr 150 Mg Xr24h-cap (Venlafaxine hcl) .[name] Kitchen.. 1 daily by mouth 9)  Famotidine 10 Mg Tabs (Famotidine) .[name] Kitchen.. 1 daily by mouth 10)  Metformin Hcl 1000 Mg Tabs (Metformin hcl) .[name] Kitchen.. 1 by mouth two times a day 11)  One Touch Test Strips  .... Check fasting blood sugar, and after breakfast, lunch, and dinner (new insulin, 250.00) 12)  Lantus 100 Unit/ml Soln (Insulin glargine) .... 60 units subq every day as directed 13)  Clarinex 5 Mg Tabs (Desloratadine) .[name] Kitchen.. 1 once daily for congestion 14)  Humalog 100 Unit/ml Soln (Insulin lispro (human)) .... 4 units prior to meals, dispense 1 month supply 15)  Fluconazole 200 Mg Tabs (Fluconazole) .... 2 tabs by mouth x 1, then repeat 2 tabs in 1 week  Diabetes Management Assessment/Plan:      The following lipid goals have been established for the patient: Total cholesterol goal of 200; LDL cholesterol goal of 100; HDL cholesterol goal of 40; Triglyceride goal of 150.  His blood pressure goal is < 130/80.    Hypertension Assessment/Plan:      The patient's hypertensive risk group is category C: Target organ damage and/or diabetes.  His calculated 10 year risk of coronary heart disease is 14 %.  Today's blood pressure is 120/70.  His blood pressure goal is < 130/80.  Lipid Assessment/Plan:      The patient's calculated 10 year coronary heart disease risk is 14 %.  Based on NCEP/ATP III, the patient's risk factor category is "history of diabetes".  From this information, the patient's calculated lipid goals are as follows: Total cholesterol goal is 200; LDL cholesterol goal is 100; HDL cholesterol goal is 40; Triglyceride goal is 150.  His LDL cholesterol goal has been met.    Patient Instructions: 1)  f/u 3 months 2)  HgBA1c prior to visit  ICD-9: 250.00 Prescriptions: FLUCONAZOLE 200 MG TABS (FLUCONAZOLE) 2 tabs by mouth x 1, then repeat 2 tabs in 1 week  #4 x 0   Entered and Authorized by:   Hannah Beat MD   Signed by:   Hannah Beat MD on 05/01/2009   Method used:   Electronically to        [facility]* (retail)       [address]       [place], [place]  [address]       Ph: [phone]       Fax: [phone]   RxID:   [id number] HUMALOG 100 UNIT/ML  SOLN (INSULIN LISPRO (HUMAN)) 4 units prior to meals, dispense 1 month supply  #1 x 5   Entered and Authorized by:   Hannah Beat MD   Signed by:   Hannah Beat MD on 05/01/2009   Method used:   Electronically to        [facility]* (retail)       [address]       [place], [place]  [address]       Ph: [phone]       Fax: [phone]   RxID:   [id number]   Current Allergies (reviewed today): ! * TETRACYLINE   Prevention & Chronic Care Immunizations   Influenza vaccine: given  (01/27/2009)   Influenza vaccine due: 01/27/2010    Tetanus booster: 04/13/2007: given   Tetanus booster due: 04/12/2017    Pneumococcal vaccine: Pneumovax  (10/25/2008)   Pneumococcal vaccine due: None    H. zoster vaccine: Not documented  Colorectal Screening   Hemoccult: Not documented    Colonoscopy: normal  (04/12/2004)   Colonoscopy  due: 04/12/2014  Other Screening   PSA: Not documented   Smoking status: quit  (10/25/2008)  Diabetes Mellitus   HgbA1C: 9.8  (04/24/2009)   Hemoglobin A1C due: 11/28/2008    Eye exam: Not documented   Diabetic eye exam action/deferral: Ophthalmology referral  (05/01/2009)    Foot exam: yes  (05/01/2009)   High risk foot: Not documented   Foot care education: Not documented   Foot exam due: 09/03/2009    Urine microalbumin/creatinine ratio: 9.2  (08/28/2008)   Urine microalbumin/cr due: 08/28/2009    Diabetes flowsheet reviewed?: Yes   Progress toward A1C goal: Deteriorated  Lipids   Total Cholesterol: 120  (04/24/2009)   LDL: 43  (04/24/2009)   LDL Direct: 43.4  (04/24/2009)   HDL: 31.90  (04/24/2009)   Triglycerides: 395.0  (04/24/2009)    SGOT (AST): 19  (04/24/2009)   SGPT (ALT): 23  (04/24/2009)   Alkaline phosphatase: 69  (04/24/2009)   Total bilirubin: 0.8  (04/24/2009)    Lipid flowsheet reviewed?: Yes   Progress toward LDL goal: At goal  Hypertension   Last Blood Pressure: 120 /  70  (05/01/2009)   Serum creatinine: 1.4  (04/24/2009)   Serum potassium 4.1  (04/24/2009)    Nursing Instructions: Refer for screening diabetic eye exam (see order)           LDL Result Date:  04/24/2009 LDL Result:  43 LDL Next Due:  1 yr

## 2010-05-12 NOTE — Miscellaneous (Signed)
Summary: med list update- test strips  Clinical Lists Changes  Medications: Changed medication from * ONE TOUCH TEST STRIPS Check fasting blood sugar, and after breakfast, lunch, and dinner (new insulin, 250.00) to ONETOUCH ULTRA TEST  STRP (GLUCOSE BLOOD) check fasting blood sugar and after breakfast, lunch and dinner     Prior Medications: FLONASE 50 MCG/ACT SUSP (FLUTICASONE PROPIONATE) 2 sprays each nostril daily BANOPHEN 25 MG CAPS (DIPHENHYDRAMINE HCL) 1 as needed CARDURA 8 MG TABS (DOXAZOSIN MESYLATE) 1 by mouth at bedtime INDOMETHACIN 25 MG CAPS (INDOMETHACIN) 2 tablets q 12 hours as needed for gout pain SIMVASTATIN 40 MG TABS (SIMVASTATIN) 1 daily by mouth ATENOLOL 100 MG TABS (ATENOLOL) 1 daily by mouth LISINOPRIL-HYDROCHLOROTHIAZIDE 20-12.5 MG TABS (LISINOPRIL-HYDROCHLOROTHIAZIDE) 1 daily by mouth EFFEXOR XR 150 MG XR24H-CAP (VENLAFAXINE HCL) 1 daily by mouth FAMOTIDINE 10 MG TABS (FAMOTIDINE) 1 daily by mouth METFORMIN HCL 1000 MG TABS (METFORMIN HCL) 1 by mouth two times a day ONETOUCH ULTRA TEST  STRP (GLUCOSE BLOOD) check fasting blood sugar and after breakfast, lunch and dinner LANTUS 100 UNIT/ML SOLN (INSULIN GLARGINE) 50 units at bedtime CLARINEX 5 MG TABS (DESLORATADINE) 1 once daily for congestion HUMALOG 100 UNIT/ML  SOLN (INSULIN LISPRO (HUMAN)) three times a day (just before each meal), 40-30-40 units, and syringes 4x a day METRONIDAZOLE 0.75 % CREA (METRONIDAZOLE) Apply two times a day Current Allergies: ! * TETRACYLINE

## 2010-05-12 NOTE — Letter (Signed)
Summary: Diabetic Instructions  [facility]  [address] [place], [place] [address]   Phone: [phone]  Fax: [phone]    [name] [birth year] [name]: [id number]   _  _   ORAL DIABETIC MEDICATION INSTRUCTIONS  The day before your procedure:   Take your diabetic pill as you do normally  The day of your procedure:   Do not take your diabetic pill    We will check your blood sugar levels during the admission process and again in Recovery before discharging you home  ________________________________________________________________________  _  _   INSULIN (LONG ACTING) MEDICATION INSTRUCTIONS (Lantus, NPH, 70/30, Humulin, Novolin-N)   The day before your procedure:   Take  your regular evening dose    The day of your procedure:   Do not take your morning dose    _  _   INSULIN (SHORT ACTING) MEDICATION INSTRUCTIONS (Regular, Humulog, Novolog)   The day before your procedure:   Do not take your evening dose   The day of your procedure:   Do not take your morning dose   _  _   INSULIN PUMP MEDICATION INSTRUCTIONS  We will contact the physician managing your diabetic care for written dosage instructions for the day before your procedure and the day of your procedure.  Once we have received the instructions, we will contact you.

## 2010-05-15 NOTE — Letter (Signed)
Summary: [facility]   Imported By: Lester [place] 10/22/2009 09:48:33  _____________________________________________________________________  External Attachment:    Type:   Image     Comment:   External Document

## 2010-06-15 ENCOUNTER — Telehealth: Payer: Self-pay | Admitting: Family Medicine

## 2010-06-16 ENCOUNTER — Encounter: Payer: Self-pay | Admitting: Family Medicine

## 2010-06-23 NOTE — Progress Notes (Signed)
Summary: prior Berkley Harvey is needed for venlafaxine  Phone Note From Pharmacy   Caller: [facility] Summary of Call: Prior Berkley Harvey is needed for venlafaxine, forms are on your desk. Initial call taken by: Lowella Petties CMA, AAMA,  June 15, 2010 10:36 AM

## 2010-06-25 LAB — GLUCOSE, CAPILLARY
Glucose-Capillary: 270 mg/dL — ABNORMAL HIGH (ref 70–99)
Glucose-Capillary: 290 mg/dL — ABNORMAL HIGH (ref 70–99)

## 2010-06-30 NOTE — Medication Information (Signed)
Summary: [facility]   Imported By: Kassie Mends 06/22/2010 09:33:50  _____________________________________________________________________  External Attachment:    Type:   Image     Comment:   External Document

## 2010-07-16 ENCOUNTER — Other Ambulatory Visit: Payer: Self-pay | Admitting: Family Medicine

## 2010-07-16 MED ORDER — SIMVASTATIN 40 MG PO TABS: 40.0000 mg | ORAL_TABLET | Freq: Every evening | ORAL | Status: DC

## 2010-08-25 NOTE — Op Note (Signed)
[name], [name]               ACCOUNT NO.:  [id number]   MEDICAL RECORD NO.:  [id number]          PATIENT TYPE:  INP   LOCATION:  0008                         FACILITY:  [facility]   PHYSICIAN:  Georges Lynch. Gioffre, M.D.DATE OF BIRTH:  09-Jul-1945   DATE OF PROCEDURE:  11/01/2007  DATE OF DISCHARGE:                               OPERATIVE REPORT   SURGEON:  Georges Lynch. Darrelyn Hillock, MD   ASSISTANTS:  Durene Romans, MD, and Arlyn Leak, PA.   PREOPERATIVE DIAGNOSIS:  Severe degenerative arthritis of the right hip.   POSTOPERATIVE DIAGNOSIS:  Severe degenerative arthritis of the right  hip.   OPERATION:  Right total hip arthroplasty utilizing the DePuy system.  Note, I utilized a size 56 ASR cup.  I utilized a size 6 femoral stem  that was a Engineer, manufacturing.  The C taper was a -1.  The femoral head size  was a size 49 ball.   PROCEDURE:  Under general anesthesia, the patient on the left side and  right side up, routine orthopedic prep and draping of the right hip was  carried out.  He had 2 g of IV Ancef preop.  At this time a  posterolateral approach to the hip was carried out, bleeders identified  and cauterized.  Great care was taken not to injure his underlying  sciatic nerve at all times.  I incised the iliotibial band.  The band  was quite tight so we did a release of the band distally.  Following  that I partially detached the external rotators.  I then did a  capsulectomy, dislocated the head and amputated the femoral head at the  appropriate neck cut level after utilizing the femoral guide neck guide  cut.  Following that I then utilized the box osteotome to remove the  cancellous bone from the greater trochanter.  I then utilized a widening  reamer proximally, then I inserted the canal finder to make sure we were  in the canal and I thoroughly irrigated out the femoral canal.  Following that I then utilized my rasp.  We rasped up to a size 6 with  the appropriate amount of  anteversion.  We then planed our neck down  with the calcar planer.  Following that we thoroughly irrigated out the  femoral shaft and inserted a temporary sponge.  We then concentrated on  the acetabulum.  We completed our capsulectomy.  I then reamed the  acetabulum up for a size 56 cup, we reamed up to a size 55.  We did  utilize the appropriate guide on the reamer to make sure we had the  proper amount of abduction and anteversion.  We then thoroughly  irrigated out the area and inserted our permanent size 56 mm ASR cup.  We then went back to the trials on multiple occasions to make sure that  we had the right neck length.  We selected a size 6 Trilock stem, size -  1 C-tapered, and the ball size was a size 49.  We then inserted the  permanent components and reduced the hip and made sure  no soft tissue  was interposed in the acetabulum.  We then took the hip through a range  of motion again and noticed through all the trials that we went through,  we paid strict attention to the leg  lengths.  Also, we noted the stability factor.  I thoroughly irrigated  out the hip and closed the hip in layers in the usual fashion.  The skin  was closed with metal staples.  A sterile Neosporin dressing was  applied.  He had 2 g of IV Ancef preop.           ______________________________  Georges Lynch. Darrelyn Hillock, M.D.     RAG/MEDQ  D:  11/01/2007  T:  11/01/2007  Job:  [id number]   cc:   Quita Skye. Artis Flock, M.D.  Fax: [phone]

## 2010-08-28 NOTE — Discharge Summary (Signed)
[name], Dustin               ACCOUNT NO.:  [id number]   MEDICAL RECORD NO.:  [id number]          PATIENT TYPE:  INP   LOCATION:  1616                         FACILITY:  [facility]   PHYSICIAN:  Dustin Lynch. Gioffre, Dustin GatesDATE OF BIRTH:  [birth year]   DATE OF ADMISSION:  11/01/2007  DATE OF DISCHARGE:  11/05/2007                               DISCHARGE SUMMARY   ADMISSION DIAGNOSES:  1. Severe osteoarthritis, right hip.  2. Diabetes.  3. Hypercholesterolemia.  4. Hypertension.  5. Renal insufficiency.   DISCHARGE DIAGNOSES:  1. Right total hip arthroplasty.  2. Acute postoperative blood loss anemia, treated with 2 units packed      red blood cells with no untoward events with good improvement.  3. Renal insufficiency.  4. Hypercholesterolemia.  5. Hypertension.   HISTORY OF PRESENT ILLNESS:  The patient is a 65 year old gentleman with  problems with right hip pain that has progressed over time.  He has  tried conservative treatment.  The patient elected to proceed with a  total hip arthroplasty.   MEDICATIONS ON ADMISSION:  1. Fluticasone propionate nasal spray.  2. Indomethacin.  3. Glyburide.  4. Januvia.  5. Avandia.  6. Simvastatin.  7. Doxazosin.  8. Lisinopril/hydrochlorothiazide.  9. Atenolol.  10.Ultram.  11.Effexor.  12.Famotidine.   SURGICAL PROCEDURE:  On November 01, 2007, the patient was taken to the OR  by Dr. Worthy Rancher for a right total hip arthroplasty.  He was assisted  by Dr. Lajoyce Corners and  Oneida Alar [name] under general anesthesia.  The  patient had no complications.  Estimated blood loss was 250 mL.  The  patient was transferred to the recovery room and then to the orthopedic  floor in good condition.  The patient had the following components  implanted:  A size 6 high-offset Tri-Lock stem, a size 56 acetabular cup  with a 49 femoral implant, a size -1 tapered sleeve adapter.   CONSULTS:  The following routine consults were requested:  Physical  therapy, case management, pharmacist for DVT prophylaxis.   HOSPITAL COURSE:  On November 01, 2007, the patient was admitted to [facility] under the care of Dr. Worthy Rancher.  The patient was taken  to the OR, where a right total hip arthroplasty was performed without  any complications.  The patient tolerated the procedure well.  The  patient was transferred to the recovery room to the orthopedic floor in  good condition with IV antibiotics, pain medicines and DVT prophylaxis,  to follow total hip protocol.  The patient then spent 3 days  postoperative on the orthopedic floor, in which the patient did develop  some postoperative blood loss anemia.  His hemoglobin did drop to 7.7.  He was typed and crossed and transfused 2 units of packed red blood  cells with his hemoglobin improving to 9.6 on the date of discharge  without any complications.  The patient was able to wean off his IV  antibiotics and pain medicines well.  He did require one fluid bolus for  blood pressure issues, but this stabilized once he received blood  transfusion.  The patient did have some preoperative renal  insufficiency, and it did improve somewhat during his hospitalization  with IV hydration.  His estimated GFR preop was 38 and on discharge it  was 43.  The patient's wound remained benign for any signs of infection.  His leg remained neuromotor and vascular intact.  The patient worked  well with physical therapy, and it was felt on postop day number 4 he  was orthopedically ready and stable for home.  He  was discharged with  outpatient home health, physical therapy instructions and followup  instructions.   LABORATORIES:  CBC on admission found WBC 5.3, hemoglobin 11.7,  hematocrit 34.4, platelets 174.  His H&H dropped to 7.7 and 22.3 on the  second postop day, so he was typed and crossed and transfused 2 units of  blood.  His H&H on discharge was 9.6 and 28.  The patient's routine  chemistries on  admission found sodium of 135, potassium 4.5, glucose  152, BUN 39, creatinine 1.8.  This was known by his primary care  physician, so it was monitored and he was just improved with hydration  and blood transfusions, where his BUN on discharge was 24 with a  creatinine of 1.65.  His estimated GFR on admission was 38 but improved  to 43 on discharge.  His glucose throughout his hospitalization was very  labile as they held his metformin one day due to his creatinine issues.  The patient did receive 2 units of packed red blood cells.  The patient  on admission did have a chest x-ray which showed an undefined opaque  area of the lateral left lung base.  This was reevaluated in his  hospitalization with a CT of the chest.  I believe it was benign, but no  CPT report was in the chart upon discharge, but Dr. Darrelyn [name] did review  the CT with the patient at length.   DISCHARGE INSTRUCTIONS:  1. Diet:  The patient is to maintain an 1800-calorie ADA diet.  2. Activity:  The patient is to increase his activity as comfortable      with the use of a walker, maintain 50% weightbearing on his right      lower extremity.  3. Wound care:  The patient is to change his dressing on a daily      basis.  4. Followup:  The patient needs a followup appointment with his      primary care physician.  The patient needs a followup appointment      with Dr. Darrelyn [name] 2 weeks from date of surgery.  The patient is to      call [phone] for that followup appointment.   MEDICATIONS:  1. Fluticasone propionate nasal spray 2 sprays both nares as needed.  2. Indomethacin on hold.  3. Glyburide/metformin 2.5/500 two tablets in the morning, two      tablets in the evening.  4. Januvia 100 mg at bedtime.  5. Avandia 8 mg in the a.m.  6. Simvastatin 40 mg at bedtime.  7. Doxazosin 8 mg at bedtime.  8. Lisinopril/hydrochlorothiazide 20/12.5 in the a.m.  9. Atenolol 100 mg in the a.m.  10.Ultram on hold.  11.Effexor 100 mg  at bedtime.  12.Famotidine 20 mg daily as needed.  13.Equate diphenhydramine 25 mg 2 times in the morning and in the      evening.  14.Morphine tablets on hold.  15.Percocet 650 1 tablet every 4 to 6 hours for pain if  needed.  16.Robaxin 500 mg 1 tablet every 6 hours for pain if needed.  17.Coumadin 2.5 mg a day for a total of 30 days unless changed by      outpatient home health physical therapist pharmacy.   The patient's condition upon discharge to home is listed as improved and  good.      Jamelle Rushing, P.A.    ______________________________  [name], M.D.    RWK/MEDQ  D:  11/17/2007  T:  11/17/2007  Job:  [id number]   cc:   [name], M.D.  Fax: [phone]   [name], M.D.  Fax: [phone]

## 2010-09-10 ENCOUNTER — Other Ambulatory Visit: Payer: Self-pay | Admitting: *Deleted

## 2010-09-10 MED ORDER — INSULIN LISPRO 100 UNIT/ML ~~LOC~~ SOLN: 50.0000 [IU] | Freq: Three times a day (TID) | SUBCUTANEOUS | Status: DC

## 2010-09-10 NOTE — Telephone Encounter (Signed)
R'cd fax from [facility] for refill of pt's Humalog insulin.  Last OV-10/01/2009 Last Filled-05/05/2010

## 2010-09-16 ENCOUNTER — Encounter: Payer: Self-pay | Admitting: Family Medicine

## 2010-09-16 ENCOUNTER — Ambulatory Visit (INDEPENDENT_AMBULATORY_CARE_PROVIDER_SITE_OTHER): Payer: 59 | Admitting: Family Medicine

## 2010-09-16 VITALS — BP 140/80 | HR 77 | Temp 98.6°F | Wt 252.8 lb

## 2010-09-16 DIAGNOSIS — Z1211 Encounter for screening for malignant neoplasm of colon: Secondary | ICD-10-CM

## 2010-09-16 DIAGNOSIS — R5381 Other malaise: Secondary | ICD-10-CM

## 2010-09-16 DIAGNOSIS — Z96649 Presence of unspecified artificial hip joint: Secondary | ICD-10-CM

## 2010-09-16 DIAGNOSIS — Z125 Encounter for screening for malignant neoplasm of prostate: Secondary | ICD-10-CM

## 2010-09-16 DIAGNOSIS — Z01810 Encounter for preprocedural cardiovascular examination: Secondary | ICD-10-CM

## 2010-09-16 DIAGNOSIS — R5383 Other fatigue: Secondary | ICD-10-CM

## 2010-09-16 DIAGNOSIS — Z01818 Encounter for other preprocedural examination: Secondary | ICD-10-CM

## 2010-09-16 DIAGNOSIS — E119 Type 2 diabetes mellitus without complications: Secondary | ICD-10-CM

## 2010-09-16 DIAGNOSIS — Z8249 Family history of ischemic heart disease and other diseases of the circulatory system: Secondary | ICD-10-CM

## 2010-09-16 DIAGNOSIS — M1990 Unspecified osteoarthritis, unspecified site: Secondary | ICD-10-CM

## 2010-09-16 DIAGNOSIS — I10 Essential (primary) hypertension: Secondary | ICD-10-CM

## 2010-09-16 DIAGNOSIS — E785 Hyperlipidemia, unspecified: Secondary | ICD-10-CM

## 2010-09-16 DIAGNOSIS — Z87891 Personal history of nicotine dependence: Secondary | ICD-10-CM

## 2010-09-16 LAB — CBC WITH DIFFERENTIAL/PLATELET
Basophils Relative: 0.4 % (ref 0.0–3.0)
Eosinophils Relative: 1 % (ref 0.0–5.0)
HCT: 34.5 % — ABNORMAL LOW (ref 39.0–52.0)
Hemoglobin: 11.9 g/dL — ABNORMAL LOW (ref 13.0–17.0)
Lymphocytes Relative: 24 % (ref 12.0–46.0)
Lymphs Abs: 1.3 10*3/uL (ref 0.7–4.0)
Monocytes Relative: 8.3 % (ref 3.0–12.0)
Neutro Abs: 3.5 10*3/uL (ref 1.4–7.7)
RBC: 3.81 Mil/uL — ABNORMAL LOW (ref 4.22–5.81)
WBC: 5.3 10*3/uL (ref 4.5–10.5)

## 2010-09-16 LAB — HM DIABETES FOOT EXAM

## 2010-09-16 LAB — PSA: PSA: 0.37 ng/mL (ref 0.10–4.00)

## 2010-09-16 LAB — LIPID PANEL
HDL: 34.5 mg/dL — ABNORMAL LOW (ref >39.00)
Total CHOL/HDL Ratio: 5
VLDL: 122 mg/dL — ABNORMAL HIGH (ref 0.0–40.0)

## 2010-09-16 LAB — HEPATIC FUNCTION PANEL
ALT: 28 U/L (ref 0–53)
Albumin: 4.2 g/dL (ref 3.5–5.2)
Total Protein: 7 g/dL (ref 6.0–8.3)

## 2010-09-16 LAB — BASIC METABOLIC PANEL
GFR: 48.81 mL/min — ABNORMAL LOW (ref >60.00)
Glucose, Bld: 199 mg/dL — ABNORMAL HIGH (ref 70–99)
Potassium: 4.2 mEq/L (ref 3.5–5.1)
Sodium: 136 mEq/L (ref 135–145)

## 2010-09-16 LAB — LDL CHOLESTEROL, DIRECT: Direct LDL: 64.4 mg/dL

## 2010-09-16 LAB — MICROALBUMIN / CREATININE URINE RATIO
Creatinine,U: 142.1 mg/dL
Microalb, Ur: 3.3 mg/dL — ABNORMAL HIGH (ref 0.0–1.9)

## 2010-09-16 NOTE — Patient Instructions (Signed)
REFERRAL: GO THE THE FRONT ROOM AT THE ENTRANCE OF OUR CLINIC, NEAR CHECK IN. ASK FOR MARION. SHE WILL HELP YOU SET UP YOUR REFERRAL. DATE: TIME:  

## 2010-09-16 NOTE — Progress Notes (Signed)
65 year old male, consult by Dr. Charlann Boxer for preoperative evaluation for R total hip revision, s/p THA on the R in 2009 by Dr. Darrelyn Hillock in this patient with multiple medical problems.  1. DM: Doing a very poor job with his diet: "I eat whatever I want." Unable to do traditional exercise with limitation in his hip motion and pain Blood sugar up in the 200's. 3 shots of 50 units of humalog Lantus to 80 units   Lab Results  Component Value Date   HGBA1C 10.6* 09/16/2010   Chromium levels and cobalt levels are high - checked at [facility].  2. Cardiac Risk: 7 or 8 years ago had a stress test.  Smoker, quit about 10 years ago, 30 years, up to 3 packs a day FH: Sister with a MI in her 30's. BMI = 34 DM, poor control HTN Lipids No exercise No active chest pain Cannot meet a 4 met equivalent in exercise.  3.  HTN: Tolerating all medications without side effects Stable and at goal normally - forgot his BP pill this morning No CP, no sob. No HA.  BP Readings from Last 3 Encounters:  09/16/10 140/80  02/05/10 132/72  12/24/09 130/80    Basic Metabolic Panel:    Component Value Date/Time   NA 136 09/16/2010 1215   K 4.2 09/16/2010 1215   CL 98 09/16/2010 1215   CO2 29 09/16/2010 1215   BUN 22 09/16/2010 1215   CREATININE 1.5 09/16/2010 1215   GLUCOSE 199* 09/16/2010 1215   CALCIUM 9.3 09/16/2010 1215   4. Hyperlipidemia:  Lipids: Eating poorly. Tolerating meds fine with no SE. Panel ordered today:  Lipids:    Component Value Date/Time   CHOL 161 09/16/2010 1215   TRIG 610.0 Triglyceride is over 400; calculations on Lipids are invalid.* 09/16/2010 1215   HDL 34.50* 09/16/2010 1215   LDLDIRECT 64.4 09/16/2010 1215   VLDL 122.0* 09/16/2010 1215   CHOLHDL 5 09/16/2010 1215    Lab Results  Component Value Date   ALT 28 09/16/2010   AST 24 09/16/2010   ALKPHOS 79 09/16/2010   BILITOT 0.3 09/16/2010   REVIEW OF SYSTEMS  GEN: No fevers, chills. Nontoxic. Primarily MSK c/o today. MSK: Detailed in the  HPI No C No SOB No polyuria No blurred vision GI: tolerating PO intake without difficulty Neuro: No numbness, parasthesias, or tingling associated. Otherwise, the pertinent positives and negatives are listed above and in the HPI, otherwise a full review of systems has been reviewed and is negative unless noted positive.   Physical Exam  Blood pressure 140/80, pulse 77, temperature 98.6 F (37 C), weight 252 lb 12 oz (114.647 kg).  PE: GEN: well developed, well nourished, no acute distress Eyes: conjunctiva and lids normal ENT: TM clear, nares clear, oral exam WNL Neck: supple, no lymphadenopathy, no thyromegaly, no JVD Pulm: clear to auscultation and percussion, respiratory effort normal CV: regular rate and rhythm, S1-S2, no murmur, rub or gallop, no bruits, peripheral pulses normal and symmetric, no cyanosis, clubbing, edema or varicosities Chest: no scars, masses, no gynecomastia   GI: soft, non-tender; no hepatosplenomegaly, masses; active bowel sounds all quadrants Lymph: no cervical adenopathy MSK: gait antalgic Derm: no rash Neuro: normal mental status, normal strength, sensation, and motion Psych: alert; oriented to person, place and time, normally interactive and not anxious or depressed in appearance.  A/P: one. Medical and cardiac clearance. [employer] of cardiology guidelines, the patient needs further evaluation to determine whether or  not he has significant coronary disease prior to general anesthesia for his total hip revision. He is unable to walk to do a normal stress test given his limitations of his hip and upcoming total hip revision. We will obtain a Lexi scan Myoview to evaluate for potential coronary artery disease.  2. Diabetes mellitus. Very uncontrolled. Hemoglobin A1c at 11 despite high levels of insulin usage. He reports compliance. He is taking 80 units of Lantus at night as well as 50 units of Humalog before meals. He knows that he is doing very  poorly with his diet. Until his diabetes is under better control, he is not clear from a medical standpoint to have surgery. In this case, risk outweighs potential benefits until medically stable. I am going to ask the patient to follow-up with Dr. Everardo All, his Endrocrinologist to help obtain tighter blood sugar control.  3. Hyperlipidemia and hypertriglyceridemia: Elevated, very poor diet, labs reviewed at the time of this this dictation. We'll contact the patient to see if he is amenable to going on a change to Simcor or Advicor to try to lower his trigs. Diet.  4. HTN: relatively stable, urged compliance.  Cc: Dr. Lajoyce Corners

## 2010-09-17 ENCOUNTER — Encounter: Payer: Self-pay | Admitting: *Deleted

## 2010-09-20 ENCOUNTER — Encounter: Payer: Self-pay | Admitting: Family Medicine

## 2010-09-22 ENCOUNTER — Ambulatory Visit (HOSPITAL_COMMUNITY): Payer: 59 | Attending: Family Medicine | Admitting: Radiology

## 2010-09-22 DIAGNOSIS — M1990 Unspecified osteoarthritis, unspecified site: Secondary | ICD-10-CM

## 2010-09-22 DIAGNOSIS — Z96649 Presence of unspecified artificial hip joint: Secondary | ICD-10-CM

## 2010-09-22 DIAGNOSIS — E785 Hyperlipidemia, unspecified: Secondary | ICD-10-CM

## 2010-09-22 DIAGNOSIS — E119 Type 2 diabetes mellitus without complications: Secondary | ICD-10-CM

## 2010-09-22 DIAGNOSIS — R5381 Other malaise: Secondary | ICD-10-CM | POA: Insufficient documentation

## 2010-09-22 DIAGNOSIS — R5383 Other fatigue: Secondary | ICD-10-CM | POA: Insufficient documentation

## 2010-09-22 DIAGNOSIS — Z01810 Encounter for preprocedural cardiovascular examination: Secondary | ICD-10-CM

## 2010-09-22 DIAGNOSIS — R0602 Shortness of breath: Secondary | ICD-10-CM

## 2010-09-22 DIAGNOSIS — Z87891 Personal history of nicotine dependence: Secondary | ICD-10-CM

## 2010-09-22 DIAGNOSIS — Z8249 Family history of ischemic heart disease and other diseases of the circulatory system: Secondary | ICD-10-CM

## 2010-09-22 DIAGNOSIS — I10 Essential (primary) hypertension: Secondary | ICD-10-CM

## 2010-09-22 MED ORDER — TECHNETIUM TC 99M TETROFOSMIN IV KIT
11.0000 | PACK | Freq: Once | INTRAVENOUS | Status: AC | PRN
  Administered 2010-09-22: 11 via INTRAVENOUS

## 2010-09-22 MED ORDER — REGADENOSON 0.4 MG/5ML IV SOLN
0.4000 mg | Freq: Once | INTRAVENOUS | Status: AC
  Administered 2010-09-22: 0.4 mg via INTRAVENOUS

## 2010-09-22 MED ORDER — TECHNETIUM TC 99M TETROFOSMIN IV KIT
33.0000 | PACK | Freq: Once | INTRAVENOUS | Status: AC | PRN
  Administered 2010-09-22: 33 via INTRAVENOUS

## 2010-09-22 NOTE — Progress Notes (Signed)
[facility] SITE 3 NUCLEAR MED [address] [place] [address] [phone]  Cardiology Nuclear Med Study  [name] is a 65 y.o. male [id number] [birth year]   Nuclear Med Background Indication for Stress Test:  Evaluation for Ischemia and Pending Surgical Clearance for THR by Dr. Durene Romans on 11/09/10 History:  ~10-15 yrs ago MPS:OK per patient Cardiac Risk Factors: Family History - CAD, History of Smoking, Hypertension, IDDM Type 2 and Lipids  Symptoms:  Fatigue   Nuclear Pre-Procedure Caffeine/Decaff Intake:  None NPO After: 7:00pm   Lungs:  Clear.  O2 sat 96% on RA. IV 0.9% NS with Angio Cath:  20g  IV Site: R Antecubital  IV Started by:  Stanton Kidney, EMT-P  Chest Size (in):  50 Cup Size: n/a  Height: 6' (1.829 m)  Weight:  250 lb (113.399 kg)  BMI:  Body mass index is 33.91 kg/(m^2). Tech Comments:  Atenolol held this am, CBG= 260 @ 9:30 am, per patient.    Nuclear Med Study 1 or 2 day study: 1 day  Stress Test Type:  Lexiscan  Reading MD: Willa Rough, MD  Order Authorizing Provider:  Hannah Beat, MD  Resting Radionuclide: Technetium 12m Tetrofosmin  Resting Radionuclide Dose: 11 mCi   Stress Radionuclide:  Technetium 29m Tetrofosmin  Stress Radionuclide Dose: 33 mCi           Stress Protocol Rest HR: 77 Stress HR: 94  Rest BP: 105/70 Stress BP: 121/63  Exercise Time (min): n/a METS: n/a   Predicted Max HR: 156 bpm % Max HR: 60.26 bpm Rate Pressure Product: 69629   Dose of Adenosine (mg):  n/a Dose of Lexiscan: 0.4 mg  Dose of Atropine (mg): n/a Dose of Dobutamine: n/a mcg/kg/min (at max HR)  Stress Test Technologist: Smiley Houseman, CMA-N  Nuclear Technologist:  Domenic Polite, CNMT     Rest Procedure:  Myocardial perfusion imaging was performed at rest 45 minutes following the intravenous administration of Technetium 54m Tetrofosmin.  Rest ECG: No acute changes.  Stress Procedure:  The patient received IV Lexiscan 0.4  mg over 15-seconds.  Technetium 61m Tetrofosmin injected at 30-seconds.  There were no significant changes with Lexiscan.  Quantitative spect images were obtained after a 45 minute delay.  Stress ECG: No significant change from baseline ECG  QPS Raw Data Images:  Patient motion noted; appropriate software correction applied. Stress Images:  Apical thinning Rest Images:  Same as stress Subtraction (SDS):  No evidence of ischemia. Transient Ischemic Dilatation (Normal <1.22): .95 Lung/Heart Ratio (Normal <0.45):  .32  Quantitative Gated Spect Images QGS EDV:  87 ml QGS ESV:  32 ml QGS cine images:  NL LV Function; NL Wall Motion QGS EF: 63%  Impression Exercise Capacity:  Lexiscan with no exercise. BP Response:  Normal blood pressure response. Clinical Symptoms:  Stomach discomfort ECG Impression:  No significant ST segment change suggestive of ischemia. Comparison with Prior Nuclear Study: No images to compare  Overall Impression:  Normal stress nuclear study.   Willa Rough

## 2010-09-23 ENCOUNTER — Other Ambulatory Visit: Payer: Self-pay | Admitting: Family Medicine

## 2010-09-23 MED ORDER — NIACIN-LOVASTATIN ER 1000-40 MG PO TB24: 1.0000 | ORAL_TABLET | Freq: Every evening | ORAL | Status: DC

## 2010-09-23 MED ORDER — NIACIN-LOVASTATIN ER 500-20 MG PO TB24: 1.0000 | ORAL_TABLET | Freq: Every day | ORAL | Status: DC

## 2010-09-23 NOTE — Progress Notes (Signed)
Called and discussed labs and lexiscan results with wife  D/c statin Start advicor  F/u Dr. Everardo All for assistance with DM management

## 2010-09-23 NOTE — Progress Notes (Signed)
COPY TO DR. Patsy Lager.Scarlette Ar

## 2010-09-24 ENCOUNTER — Telehealth: Payer: Self-pay | Admitting: *Deleted

## 2010-09-24 NOTE — Telephone Encounter (Signed)
The only equivalents would be advicor or simcor, either one would be ok - statin alone would not. simcor would be OK if that is a cheaper option for him  I discussed with pharmD yesterday, start lower dose (500-20), then following refill should be for the higher dose.

## 2010-09-24 NOTE — Telephone Encounter (Signed)
[facility] is calling regarding advicor script.  This is not a preferred drug on pt's formulary, has a $60.00 copay so they are saying that pt may prefer something less expensive (lovastatin, pravastatin, simvastatin, fluvastatin).  Also, they are asking if pt is to start with 500-200 mg dose for one month and then increase to 1000-40 mg's.  Please advise.

## 2010-09-25 NOTE — Telephone Encounter (Signed)
Rob wanted to know if patient can do niacin over the counter  And then 20mg  advicor is that okay !!! Or he can probably do 2 different medications not combined do them seperate

## 2010-09-28 MED ORDER — NIACIN ER (ANTIHYPERLIPIDEMIC) 500 MG PO TBCR: 500.0000 mg | EXTENDED_RELEASE_TABLET | Freq: Every day | ORAL | Status: DC

## 2010-09-28 NOTE — Telephone Encounter (Signed)
Discussed with ROB  Will d/c advicor Resume zocor  Titrate up niaspan

## 2010-10-05 ENCOUNTER — Encounter: Payer: Self-pay | Admitting: Endocrinology

## 2010-10-05 ENCOUNTER — Ambulatory Visit (INDEPENDENT_AMBULATORY_CARE_PROVIDER_SITE_OTHER): Payer: 59 | Admitting: Endocrinology

## 2010-10-05 VITALS — BP 134/76 | HR 97 | Temp 98.8°F | Ht 72.0 in | Wt 253.8 lb

## 2010-10-05 DIAGNOSIS — E119 Type 2 diabetes mellitus without complications: Secondary | ICD-10-CM

## 2010-10-05 MED ORDER — INSULIN GLARGINE 100 UNIT/ML ~~LOC~~ SOLN: 80.0000 [IU] | Freq: Every day | SUBCUTANEOUS | Status: DC

## 2010-10-05 NOTE — Progress Notes (Signed)
Subjective:    Patient ID: [name], male    DOB: 1945-11-22, 65 y.o.   [name]: [id number]  HPI no cbg record, but states cbg's are "high due to poor diet and exercise."  Exercise is limited by right hip pain (he will have surgery soon).  He says he takes his insulin as prescribed.  There is no trend throughout the day.  Past Medical History  Diagnosis Date  . Depression   . Diabetes mellitus   . Hyperlipidemia   . Hypertension   . Osteoarthritis   . Allergic rhinitis due to pollen   . GERD (gastroesophageal reflux disease)   . Gout   . Diverticulosis     Past Surgical History  Procedure Date  . Cholecystectomy 1980  . Total hip arthroplasty 2009    Dr. Darrelyn Hillock    History   Social History  . Marital Status: Married    Spouse Name: N/A    Number of Children: N/A  . Years of Education: N/A   Occupational History  . Dispatcher    Social History Main Topics  . Smoking status: Former Smoker -- 70 years    Quit date: 04/12/2000  . Smokeless tobacco: Never Used  . Alcohol Use: No  . Drug Use: No  . Sexually Active: Not on file   Other Topics Concern  . Not on file   Social History Narrative   No regular exercise    Current Outpatient Prescriptions on File Prior to Visit  Medication Sig Dispense Refill  . atenolol (TENORMIN) 100 MG tablet Take 100 mg by mouth daily.        [name] Kitchen b complex vitamins tablet Take 1 tablet by mouth daily.        . cholecalciferol (VITAMIN D) 1000 UNITS tablet Take 1,000 Units by mouth daily.        [name] Kitchen desloratadine (CLARINEX) 5 MG tablet Take 5 mg by mouth daily.        [name] Kitchen doxazosin (CARDURA) 8 MG tablet Take 8 mg by mouth at bedtime.        . famotidine (PEPCID) 10 MG tablet Take 10 mg by mouth daily.        . fluticasone (FLONASE) 50 MCG/ACT nasal spray Place 2 sprays into the nose daily.        [name] Kitchen glucose blood test strip (Onetouch Ultra) test strips-check fasting blood sugar and after breakfast, lunch, and dinner       . indomethacin  (INDOCIN) 25 MG capsule Take two tablets every 12 hours as needed for gout pain       . insulin glargine (LANTUS) 100 UNIT/ML injection 60 Units at bedtime.       . insulin lispro (HUMALOG) 100 UNIT/ML injection Inject 50 Units into the skin 3 (three) times daily before meals.       [name] Kitchen lisinopril-hydrochlorothiazide (PRINZIDE,ZESTORETIC) 20-12.5 MG per tablet Take 1 tablet by mouth daily.        . metFORMIN (GLUMETZA) 1000 MG (MOD) 24 hr tablet Take 1,000 mg by mouth 2 (two) times daily.        . niacin (NIASPAN) 500 MG CR tablet Take 1 tablet (500 mg total) by mouth at bedtime. Take an aspirin before taking the niaspan tablet  30 tablet  5  . Venlafaxine HCl 225 MG TB24 Take 1 tablet by mouth daily.          Allergies  Allergen Reactions  . Tetracycline     REACTION: rash  Family History  Problem Relation Age of Onset  . Diabetes Mother   . Diabetes Father   . Cancer Sister     lung  . Heart disease Maternal Grandfather     BP 134/76  Pulse 97  Temp(Src) 98.8 F (37.1 C) (Oral)  Ht 6' (1.829 m)  Wt 253 lb 12.8 oz (115.123 kg)  BMI 34.42 kg/m2  SpO2 95%    Review of Systems denies hypoglycemia    Objective:   Physical Exam Pulses: dorsalis pedis intact bilat.   Feet: no deformity.  no ulcer on the feet.  feet are of normal color and temp.  no edema.  There are bilat varicosities, left leg >> right. Neuro: sensation is intact to touch on the feet    . Lab Results  Component Value Date   HGBA1C 10.6* 09/16/2010      Assessment & Plan:  Dm, with poor control.  therapy limited by noncompliance with f/u appointment.  i'll do the best i can.

## 2010-10-05 NOTE — Patient Instructions (Addendum)
Continue humalog three times a day (just before each meal) 40-30-40 units.  however, take only 20 units if exertion is anticipated.   increase lantus to 80 units at night. Please make a follow-up appointment in 1 week. check your blood sugar 2 times a day.  vary the time of day when you check, between before the 3 meals, and at bedtime.  also check if you have symptoms of your blood sugar being too high or too low.  please keep a record of the readings and bring it to your next appointment here.  please call us sooner if you are having low blood sugar episodes. good diet and exercise habits significanly improve the control of your diabetes.  please let me know if you wish to be referred to a dietician.  high blood sugar is very risky to your health.  you should see an eye doctor every year. controlling your blood pressure and cholesterol drastically reduces the damage diabetes does to your body.  this also applies to quitting smoking.  please discuss these with your doctor.  you should take an aspirin every day, unless you have been advised by a doctor not to.

## 2010-10-16 ENCOUNTER — Encounter: Payer: Self-pay | Admitting: Endocrinology

## 2010-10-16 ENCOUNTER — Ambulatory Visit (INDEPENDENT_AMBULATORY_CARE_PROVIDER_SITE_OTHER): Payer: Medicare Other | Admitting: Endocrinology

## 2010-10-16 DIAGNOSIS — E119 Type 2 diabetes mellitus without complications: Secondary | ICD-10-CM

## 2010-10-16 NOTE — Progress Notes (Signed)
Subjective:    Patient ID: [name], male    DOB: Aug 18, 1945, 65 y.o.   [name]: [id number]  HPI pt states he feels well in general.  he brings a record of his cbg's which i have reviewed today.  It varies from 68 (afternoon) to 200 (hs and am).  The 68 in the afternoon was after he missed lunch (and lunch insulin).   Past Medical History  Diagnosis Date  . Depression   . Diabetes mellitus   . Hyperlipidemia   . Hypertension   . Osteoarthritis   . Allergic rhinitis due to pollen   . GERD (gastroesophageal reflux disease)   . Gout   . Diverticulosis     Past Surgical History  Procedure Date  . Cholecystectomy 1980  . Total hip arthroplasty 2009    Dr. Darrelyn Hillock    History   Social History  . Marital Status: Married    Spouse Name: N/A    Number of Children: N/A  . Years of Education: N/A   Occupational History  . Dispatcher    Social History Main Topics  . Smoking status: Former Smoker -- 70 years    Quit date: 04/12/2000  . Smokeless tobacco: Never Used  . Alcohol Use: No  . Drug Use: No  . Sexually Active: Not on file   Other Topics Concern  . Not on file   Social History Narrative   No regular exercise    Current Outpatient Prescriptions on File Prior to Visit  Medication Sig Dispense Refill  . atenolol (TENORMIN) 100 MG tablet Take 100 mg by mouth daily.        [name] Kitchen b complex vitamins tablet Take 1 tablet by mouth daily.        . cholecalciferol (VITAMIN D) 1000 UNITS tablet Take 1,000 Units by mouth daily.        [name] Kitchen desloratadine (CLARINEX) 5 MG tablet Take 5 mg by mouth daily.        [name] Kitchen doxazosin (CARDURA) 8 MG tablet Take 8 mg by mouth at bedtime.        . famotidine (PEPCID) 10 MG tablet Take 10 mg by mouth daily.        . fluticasone (FLONASE) 50 MCG/ACT nasal spray Place 2 sprays into the nose daily.        [name] Kitchen glucose blood test strip (Onetouch Ultra) test strips-check fasting blood sugar and after breakfast, lunch, and dinner       . indomethacin  (INDOCIN) 25 MG capsule Take two tablets every 12 hours as needed for gout pain       . insulin glargine (LANTUS) 100 UNIT/ML injection Inject 80 Units into the skin at bedtime.  30 mL  11  . insulin lispro (HUMALOG) 100 UNIT/ML injection Inject into the skin 3 (three) times daily before meals. 3x a day (just before each meal) 35-25-50 units      . lisinopril-hydrochlorothiazide (PRINZIDE,ZESTORETIC) 20-12.5 MG per tablet Take 1 tablet by mouth daily.        . metFORMIN (GLUMETZA) 1000 MG (MOD) 24 hr tablet Take 1,000 mg by mouth 2 (two) times daily.        . niacin (NIASPAN) 500 MG CR tablet Take 1 tablet (500 mg total) by mouth at bedtime. Take an aspirin before taking the niaspan tablet  30 tablet  5  . Venlafaxine HCl 225 MG TB24 Take 1 tablet by mouth daily.          Allergies  Allergen Reactions  . Tetracycline     REACTION: rash    Family History  Problem Relation Age of Onset  . Diabetes Mother   . Diabetes Father   . Cancer Sister     lung  . Heart disease Maternal Grandfather     BP 122/62  Pulse 91  Temp(Src) 98.2 F (36.8 C) (Oral)  Ht 6' (1.829 m)  Wt 251 lb 12.8 oz (114.216 kg)  BMI 34.15 kg/m2  SpO2 94% Review of Systems Denies loc    Objective:   Physical Exam GENERAL: no distress SKIN:  Insulin injection sites at the anterior abdomen are normal.    Assessment & Plan:  Dm, with improved control.

## 2010-10-16 NOTE — Patient Instructions (Addendum)
Continue humalog three times a day (just before each meal) 35-25-50 units.  however, take only 20 units if exertion is anticipated.   continue lantus 80 units at night. Please make a follow-up appointment in 1 month. check your blood sugar 2 times a day.  vary the time of day when you check, between before the 3 meals, and at bedtime.  also check if you have symptoms of your blood sugar being too high or too low.  please keep a record of the readings and bring it to your next appointment here.  please call us sooner if you are having low blood sugar episodes.

## 2010-11-05 ENCOUNTER — Ambulatory Visit (INDEPENDENT_AMBULATORY_CARE_PROVIDER_SITE_OTHER): Payer: Medicare Other | Admitting: Family Medicine

## 2010-11-05 ENCOUNTER — Encounter: Payer: Self-pay | Admitting: Family Medicine

## 2010-11-05 VITALS — BP 142/70 | HR 96 | Temp 98.4°F | Wt 253.8 lb

## 2010-11-05 DIAGNOSIS — R05 Cough: Secondary | ICD-10-CM | POA: Insufficient documentation

## 2010-11-05 MED ORDER — AZITHROMYCIN 250 MG PO TABS: ORAL_TABLET | ORAL | Status: AC

## 2010-11-05 NOTE — Progress Notes (Signed)
  Subjective:    Patient ID: [name], male    DOB: 07-06-1945, 65 y.o.   [name]: [id number]  HPI CC: sinus sxs  7d h/o sinus drainage, coughing sputum.  + HA and sinus pressure.  Moucous coming up ok.  Has been taking flonase and gargling with salt water, neti pot.  Subjective fevers.  + R ear draining some.  +  PNDrip and sneezing.  Main concern currently is worsening cough.  Sometimes feels weak and sore from coughing.  No chills, abd pain, nausea/vomiting, rashes, ear pain or tooth pain.  No sick contacts at home, no smokers at home.  No h/o asthma or COPD.  Review of Systems Per HPI    Objective:   Physical Exam  Nursing note and vitals reviewed. Constitutional: He appears well-developed and well-nourished. No distress.  HENT:  Head: Normocephalic and atraumatic.  Right Ear: Hearing, tympanic membrane, external ear and ear canal normal.  Left Ear: Hearing, tympanic membrane, external ear and ear canal normal.  Nose: Nose normal. No mucosal edema. Right sinus exhibits no maxillary sinus tenderness and no frontal sinus tenderness. Left sinus exhibits no maxillary sinus tenderness and no frontal sinus tenderness.  Mouth/Throat: Uvula is midline, oropharynx is clear and moist and mucous membranes are normal. No oropharyngeal exudate, posterior oropharyngeal edema, posterior oropharyngeal erythema or tonsillar abscesses.       No PNDrip  Eyes: Conjunctivae and EOM are normal. Pupils are equal, round, and reactive to light. No scleral icterus.  Neck: Normal range of motion. Neck supple.  Cardiovascular: Normal rate, regular rhythm, normal heart sounds and intact distal pulses.   No murmur heard. Pulmonary/Chest: Effort normal. No respiratory distress. He has no wheezes. He has no rales.       Coarse LLL, some upper airway trasmission  Lymphadenopathy:    He has no cervical adenopathy.  Skin: Skin is warm and dry. No rash noted.  Psychiatric: He has a normal mood and affect.           Assessment & Plan:

## 2010-11-05 NOTE — Assessment & Plan Note (Signed)
Started with sinus congestion/URTI now concern for developing bronchitis. Treat with Azithro. Update [place] if any worsening, questions.

## 2010-11-05 NOTE — Patient Instructions (Signed)
Could be developing bronchitis. Take zpack for this. Try over the counter cough syrup like delsym - check to ensure not sugared. Push fluids and rest Update [place] if fever >101.5, worsening cough or not improving as expected.

## 2010-11-10 ENCOUNTER — Other Ambulatory Visit: Payer: Self-pay | Admitting: Family Medicine

## 2010-11-10 MED ORDER — AZITHROMYCIN 250 MG PO TABS: ORAL_TABLET | ORAL | Status: AC

## 2010-11-16 ENCOUNTER — Ambulatory Visit (INDEPENDENT_AMBULATORY_CARE_PROVIDER_SITE_OTHER): Payer: Medicare Other | Admitting: Endocrinology

## 2010-11-16 ENCOUNTER — Encounter: Payer: Self-pay | Admitting: Endocrinology

## 2010-11-16 VITALS — BP 118/68 | HR 82 | Temp 98.1°F | Ht 74.0 in | Wt 256.0 lb

## 2010-11-16 DIAGNOSIS — E119 Type 2 diabetes mellitus without complications: Secondary | ICD-10-CM

## 2010-11-16 NOTE — Progress Notes (Signed)
Subjective:    Patient ID: [name], male    DOB: October 03, 1945, 65 y.o.   [name]: [id number]  HPI no cbg record, but states cbg was low once, in the afternoon, with activity.  He had skipped lunch, and lunch insulin.  He says it is highest in am (high-100's).  He says he sometimes eats at bedtime.   Past Medical History  Diagnosis Date  . Depression   . Diabetes mellitus   . Hyperlipidemia   . Hypertension   . Osteoarthritis   . Allergic rhinitis due to pollen   . GERD (gastroesophageal reflux disease)   . Gout   . Diverticulosis     Past Surgical History  Procedure Date  . Cholecystectomy 1980  . Total hip arthroplasty 2009    Dr. Darrelyn Hillock    History   Social History  . Marital Status: Married    Spouse Name: N/A    Number of Children: N/A  . Years of Education: N/A   Occupational History  . Dispatcher    Social History Main Topics  . Smoking status: Former Smoker -- 70 years    Quit date: 04/12/2000  . Smokeless tobacco: Never Used  . Alcohol Use: No  . Drug Use: No  . Sexually Active: Not on file   Other Topics Concern  . Not on file   Social History Narrative   No regular exercise    Current Outpatient Prescriptions on File Prior to Visit  Medication Sig Dispense Refill  . atenolol (TENORMIN) 100 MG tablet Take 100 mg by mouth daily.        [name] Kitchen b complex vitamins tablet Take 1 tablet by mouth daily.        . cholecalciferol (VITAMIN D) 1000 UNITS tablet Take 1,000 Units by mouth daily.        [name] Kitchen desloratadine (CLARINEX) 5 MG tablet Take 5 mg by mouth daily.        [name] Kitchen doxazosin (CARDURA) 8 MG tablet Take 8 mg by mouth at bedtime.        . famotidine (PEPCID) 10 MG tablet Take 10 mg by mouth daily.        . fluticasone (FLONASE) 50 MCG/ACT nasal spray Place 2 sprays into the nose daily.        [name] Kitchen glucose blood test strip (Onetouch Ultra) test strips-check fasting blood sugar and after breakfast, lunch, and dinner       . indomethacin (INDOCIN) 25 MG capsule  Take two tablets every 12 hours as needed for gout pain       . insulin lispro (HUMALOG) 100 UNIT/ML injection Inject into the skin 3 (three) times daily before meals. 3x a day (just before each meal) 35-25-50 units      . lisinopril-hydrochlorothiazide (PRINZIDE,ZESTORETIC) 20-12.5 MG per tablet Take 1 tablet by mouth daily.        . metFORMIN (GLUMETZA) 1000 MG (MOD) 24 hr tablet Take 1,000 mg by mouth 2 (two) times daily.        . niacin (NIASPAN) 500 MG CR tablet Take 1 tablet (500 mg total) by mouth at bedtime. Take an aspirin before taking the niaspan tablet  30 tablet  5  . Venlafaxine HCl 225 MG TB24 Take 1 tablet by mouth daily.        [name] Kitchen azithromycin (ZITHROMAX Z-PAK) 250 MG tablet Take 2 tablets (500 mg) on  Day 1,  followed by 1 tablet (250 mg) once daily on Days 2 through 5.  6 each  0   Allergies  Allergen Reactions  . Tetracycline     REACTION: rash   Family History  Problem Relation Age of Onset  . Diabetes Mother   . Diabetes Father   . Cancer Sister     lung  . Heart disease Maternal Grandfather    BP 118/68  Pulse 82  Temp(Src) 98.1 F (36.7 C) (Oral)  Ht 6\' 2"  (1.88 m)  Wt 256 lb (116.121 kg)  BMI 32.87 kg/m2  SpO2 97%  Review of Systems Denies loc    Objective:   Physical Exam Pulses: dorsalis pedis intact bilat.   Feet: no deformity.  no ulcer on the feet.  feet are of normal color and temp.  no edema Neuro: sensation is intact to touch on the feet    Assessment & Plan:  Dm.  he needs some adjustment in his therapy, according the the pattern of cbg's he reports.

## 2010-11-16 NOTE — Patient Instructions (Addendum)
Continue humalog three times a day (just before each meal) 35-25-50 units.  however, take only 20 units if exertion is anticipated.  If you choose to eat at bedtime, take 10 units of humalog for it.   reduce lantus to 70 units at night.   Please make a follow-up appointment in 1 month. check your blood sugar 2 times a day.  vary the time of day when you check, between before the 3 meals, and at bedtime.  also check if you have symptoms of your blood sugar being too high or too low.  please keep a record of the readings and bring it to your next appointment here.  please call us sooner if you are having low blood sugar episodes.

## 2010-12-11 ENCOUNTER — Telehealth: Payer: Self-pay

## 2010-12-11 NOTE — Telephone Encounter (Signed)
PA needed on Lantus through Medicare. [phone] pt ID# [id number]. Pt says that Insurance has advised that insulin pen would be cheaper.

## 2010-12-15 MED ORDER — INSULIN GLARGINE 100 UNIT/ML ~~LOC~~ SOLN: 70.0000 [IU] | Freq: Every day | SUBCUTANEOUS | Status: DC

## 2010-12-15 MED ORDER — INSULIN LISPRO 100 UNIT/ML ~~LOC~~ SOLN: SUBCUTANEOUS | Status: DC

## 2010-12-15 NOTE — Telephone Encounter (Signed)
PA approved through 12/14/2013-pt informed PA approved-wants rx for Humalog and Lantus sent to [facility] pharmacy.

## 2010-12-17 ENCOUNTER — Encounter: Payer: Self-pay | Admitting: Endocrinology

## 2010-12-17 ENCOUNTER — Ambulatory Visit (INDEPENDENT_AMBULATORY_CARE_PROVIDER_SITE_OTHER): Payer: Medicare Other | Admitting: Endocrinology

## 2010-12-17 ENCOUNTER — Other Ambulatory Visit (INDEPENDENT_AMBULATORY_CARE_PROVIDER_SITE_OTHER): Payer: Medicare Other

## 2010-12-17 DIAGNOSIS — E119 Type 2 diabetes mellitus without complications: Secondary | ICD-10-CM

## 2010-12-17 MED ORDER — INSULIN PEN NEEDLE 31G X 8 MM MISC: 1.0000 | Freq: Four times a day (QID) | Status: DC

## 2010-12-17 NOTE — Progress Notes (Signed)
Subjective:    Patient ID: [name], male    DOB: July 24, 1945, 65 y.o.   [name]: [id number]  HPI pt states he feels well in general.  he brings a record of his cbg's which i have reviewed today, checks only at hs and hs and in am.  He has intermittent mild hypoglycemia at approx 4 am.  It is also highest with breakfast, when he misses his lantus.  Past Medical History  Diagnosis Date  . Depression   . Diabetes mellitus   . Hyperlipidemia   . Hypertension   . Osteoarthritis   . Allergic rhinitis due to pollen   . GERD (gastroesophageal reflux disease)   . Gout   . Diverticulosis     Past Surgical History  Procedure Date  . Cholecystectomy 1980  . Total hip arthroplasty 2009    Dr. Darrelyn Hillock    History   Social History  . Marital Status: Married    Spouse Name: N/A    Number of Children: N/A  . Years of Education: N/A   Occupational History  . Dispatcher    Social History Main Topics  . Smoking status: Former Smoker -- 70 years    Quit date: 04/12/2000  . Smokeless tobacco: Never Used  . Alcohol Use: No  . Drug Use: No  . Sexually Active: Not on file   Other Topics Concern  . Not on file   Social History Narrative   No regular exercise    Current Outpatient Prescriptions on File Prior to Visit  Medication Sig Dispense Refill  . atenolol (TENORMIN) 100 MG tablet Take 100 mg by mouth daily.        [name] Kitchen b complex vitamins tablet Take 1 tablet by mouth daily.        . cholecalciferol (VITAMIN D) 1000 UNITS tablet Take 1,000 Units by mouth daily.        [name] Kitchen desloratadine (CLARINEX) 5 MG tablet Take 5 mg by mouth daily.        [name] Kitchen doxazosin (CARDURA) 8 MG tablet Take 8 mg by mouth at bedtime.        . famotidine (PEPCID) 10 MG tablet Take 10 mg by mouth daily.        . fluticasone (FLONASE) 50 MCG/ACT nasal spray Place 2 sprays into the nose daily.        [name] Kitchen glucose blood test strip (Onetouch Ultra) test strips-check fasting blood sugar and after breakfast, lunch, and  dinner       . indomethacin (INDOCIN) 25 MG capsule Take two tablets every 12 hours as needed for gout pain       . insulin glargine (LANTUS) 100 UNIT/ML injection Inject 70 Units into the skin at bedtime.  21 mL  5  . insulin lispro (HUMALOG) 100 UNIT/ML injection Inject into the skin  3x a day (just before each meal) 35-25-50 units  45 mL  5  . lisinopril-hydrochlorothiazide (PRINZIDE,ZESTORETIC) 20-12.5 MG per tablet Take 1 tablet by mouth daily.        . metFORMIN (GLUMETZA) 1000 MG (MOD) 24 hr tablet Take 1,000 mg by mouth 2 (two) times daily.        . niacin (NIASPAN) 500 MG CR tablet Take 1 tablet (500 mg total) by mouth at bedtime. Take an aspirin before taking the niaspan tablet  30 tablet  5  . Venlafaxine HCl 225 MG TB24 Take 1 tablet by mouth daily.         Allergies  Allergen Reactions  . Tetracycline     REACTION: rash   Family History  Problem Relation Age of Onset  . Diabetes Mother   . Diabetes Father   . Cancer Sister     lung  . Heart disease Maternal Grandfather    BP 128/70  Pulse 93  Temp(Src) 98.6 F (37 C) (Oral)  Ht 6' (1.829 m)  Wt 256 lb 3.2 oz (116.212 kg)  BMI 34.75 kg/m2  SpO2 98%  Review of Systems Denies loc     Objective:   Physical Exam VITAL SIGNS:  See vs page GENERAL: no distress SKIN:  Insulin injection sites at the anterior abdomen are normal Lab Results  Component Value Date   HGBA1C 8.2* 12/17/2010      Assessment & Plan:  Dm, with improved control.  However, i needs more cbg info in order to improve control further.

## 2010-12-17 NOTE — Patient Instructions (Addendum)
Continue humalog three times a day (just before each meal) 35-25-50 units.  however, take only 20 units if exertion is anticipated.  If you choose to eat at bedtime, take 10 units of humalog for it.   reduce lantus to 60 units at night.   Please make a follow-up appointment in 6 weeks. check your blood sugar 2 times a day.  vary the time of day when you check, between before the 3 meals, and at bedtime.  also check if you have symptoms of your blood sugar being too high or too low.  please keep a record of the readings and bring it to your next appointment here.  please call us sooner if you are having low blood sugar episodes.   From the standpoint of his diabetes, his surgical risk is low and outweighed by the potential benefit of the surgery.  he is therefore medically cleared from the standpoint of the diabetes.   blood tests are being requested for you today.  please call [phone] to hear your test results.  You will be prompted to enter the 9-digit "[name]" number that appears at the top left of this page, followed by #.  Then you will hear the message. Stop metformin. (update: i left message on phone-tree:  rx as we discussed)

## 2010-12-21 ENCOUNTER — Telehealth: Payer: Self-pay

## 2010-12-21 NOTE — Telephone Encounter (Signed)
Pt called requesting a tier exception be done through his insurance for his insulin. [phone] ID [id number]. [facility]

## 2010-12-23 NOTE — Telephone Encounter (Signed)
Per [place], [place] has been approved for Humalog on 12/21/2010 until 12/20/2013. Left message for pt to callback office to inform pt.

## 2010-12-24 NOTE — Telephone Encounter (Signed)
Pt informed that PA is approved and to have pharmacy contact us if cost is still too high for possible tier exception.

## 2010-12-25 ENCOUNTER — Telehealth: Payer: Self-pay

## 2010-12-25 NOTE — Telephone Encounter (Signed)
Increase humalog to 3x a day (just before each meal) 40-30-55 units

## 2010-12-25 NOTE — Telephone Encounter (Signed)
Pt called stating that since he was told to D/C Metfomin his blood sugars have been elevated, previously 120-130 now 200 and a one time 349 yesterday morning. Pt is requesting advisement, should he re-start Metformin?

## 2010-12-25 NOTE — Telephone Encounter (Signed)
Pt informed of MD's advisement to increase insulin.  

## 2010-12-29 ENCOUNTER — Ambulatory Visit (INDEPENDENT_AMBULATORY_CARE_PROVIDER_SITE_OTHER): Payer: Medicare Other | Admitting: Family Medicine

## 2010-12-29 ENCOUNTER — Encounter: Payer: Self-pay | Admitting: Family Medicine

## 2010-12-29 VITALS — BP 140/80 | HR 82 | Temp 98.3°F | Ht 72.0 in | Wt 255.8 lb

## 2010-12-29 DIAGNOSIS — G4730 Sleep apnea, unspecified: Secondary | ICD-10-CM | POA: Insufficient documentation

## 2010-12-29 DIAGNOSIS — E1140 Type 2 diabetes mellitus with diabetic neuropathy, unspecified: Secondary | ICD-10-CM | POA: Insufficient documentation

## 2010-12-29 DIAGNOSIS — E1149 Type 2 diabetes mellitus with other diabetic neurological complication: Secondary | ICD-10-CM

## 2010-12-29 DIAGNOSIS — E1142 Type 2 diabetes mellitus with diabetic polyneuropathy: Secondary | ICD-10-CM

## 2010-12-29 DIAGNOSIS — Z23 Encounter for immunization: Secondary | ICD-10-CM

## 2010-12-29 NOTE — Patient Instructions (Signed)
REFERRAL: GO THE THE FRONT ROOM AT THE ENTRANCE OF OUR CLINIC, NEAR CHECK IN. ASK FOR [name]. SHE WILL HELP YOU SET UP YOUR REFERRAL. DATE: TIME:  

## 2010-12-29 NOTE — Progress Notes (Signed)
Subjective:    Patient ID: [name], male    DOB: Feb 17, 1946, 65 y.o.   [name]: [id number]  HPI  Sleep Apnea: Patient presents with possible obstructive sleep apnea. Patent has a several years history of symptoms of daytime fatigue, morning fatigue, morning headache and hypertension. Patient generally gets about 6 hours of sleep per night, and states they generally have difficulty falling asleep and nightime awakenings. Snoring of severe severity is present. Apneic episodes are present. Nasal obstruction is not present.  Patient has not had tonsillectomy.  Neuropathy: He describes symptoms of burning and tingling. Onset of symptoms was gradual, starting about 2 months ago. Symptoms are currently of mild severity. Symptoms occur intermittently and last minutes. The patient denies cramping and squeezing. Symptoms are symmetric. He also describes autonomic symptoms of  none. He denies  alternating diarrhea and constipation and dry eyes and dry mouth. Previous treatment has included none, which has not improved symptoms.  Patient Active Problem List  Diagnoses  . DIABETES MELLITUS, TYPE II  . HYPERLIPIDEMIA  . GOUT  . DEPRESSION  . HYPERTENSION  . ALLERGIC RHINITIS  . GERD  . OSTEOARTHRITIS  . HIP REPLACEMENT, RIGHT, HX OF  . History of smoking  . Family history of MI (myocardial infarction)  . Sleep apnea  . Diabetic neuropathy   Past Medical History  Diagnosis Date  . Depression   . Diabetes mellitus   . Hyperlipidemia   . Hypertension   . Osteoarthritis   . Allergic rhinitis due to pollen   . GERD (gastroesophageal reflux disease)   . Gout   . Diverticulosis    Past Surgical History  Procedure Date  . Cholecystectomy 1980  . Total hip arthroplasty 2009    Dr. Darrelyn Hillock   History  Substance Use Topics  . Smoking status: Former Smoker -- 70 years    Quit date: 04/12/2000  . Smokeless tobacco: Never Used  . Alcohol Use: No   Family History  Problem Relation Age of  Onset  . Diabetes Mother   . Diabetes Father   . Cancer Sister     lung  . Heart disease Maternal Grandfather    Allergies  Allergen Reactions  . Tetracycline     REACTION: rash   Current Outpatient Prescriptions on File Prior to Visit  Medication Sig Dispense Refill  . atenolol (TENORMIN) 100 MG tablet Take 100 mg by mouth daily.        [name] Kitchen b complex vitamins tablet Take 1 tablet by mouth daily.        . cholecalciferol (VITAMIN D) 1000 UNITS tablet Take 1,000 Units by mouth daily.        [name] Kitchen desloratadine (CLARINEX) 5 MG tablet Take 5 mg by mouth daily.        [name] Kitchen doxazosin (CARDURA) 8 MG tablet Take 8 mg by mouth at bedtime.        . famotidine (PEPCID) 10 MG tablet Take 10 mg by mouth daily.        . fluticasone (FLONASE) 50 MCG/ACT nasal spray Place 2 sprays into the nose daily.        [name] Kitchen glucose blood test strip (Onetouch Ultra) test strips-check fasting blood sugar and after breakfast, lunch, and dinner       . indomethacin (INDOCIN) 25 MG capsule Take two tablets every 12 hours as needed for gout pain       . insulin glargine (LANTUS) 100 UNIT/ML injection Inject 60 Units into the skin at bedtime.        [name] Kitchen  insulin lispro (HUMALOG) 100 UNIT/ML injection Inject into the skin  3x a day (just before each meal) 35-25-50 units  45 mL  5  . Insulin Pen Needle (RELION PEN NEEDLE 31G/8MM) 31G X 8 MM MISC 1 Device by Does not apply route 4 (four) times daily.  120 each  11  . lisinopril-hydrochlorothiazide (PRINZIDE,ZESTORETIC) 20-12.5 MG per tablet Take 1 tablet by mouth daily.        . niacin (NIASPAN) 500 MG CR tablet Take 1 tablet (500 mg total) by mouth at bedtime. Take an aspirin before taking the niaspan tablet  30 tablet  5  . Venlafaxine HCl 225 MG TB24 Take 1 tablet by mouth daily.           Review of Systems ROS: GEN: No acute illnesses, no fevers, chills. GI: No n/v/d, eating normally Pulm: No SOB Interactive and getting along well at home. DM under better control  Lab  Results  Component Value Date   HGBA1C 8.2* 12/17/2010     Otherwise, ROS is as per the HPI.     Objective:   Physical Exam   Physical Exam  Blood pressure 140/80, pulse 82, temperature 98.3 F (36.8 C), temperature source Oral, height 6' (1.829 m), weight 255 lb 12.8 oz (116.03 kg), SpO2 98.00%.  GEN: WDWN, NAD, Non-toxic, A & O x 3 HEENT: Atraumatic, Normocephalic. Neck supple. No masses, No LAD. Ears and Nose: No external deformity. CV: RRR, No M/G/R. No JVD. No thrill. No extra heart sounds. PULM: CTA B, no wheezes, crackles, rhonchi. No retractions. No resp. distress. No accessory muscle use. EXTR: No c/c/e NEURO Normal gait.  PSYCH: Normally interactive. Conversant. Not depressed or anxious appearing.  Calm demeanor.  Diabetic foot exam: Normal inspection No skin breakdown No calluses  ? Mild decreased DP pulses Nails normal       Assessment & Plan:   1. Flu vaccine need  Flu vaccine greater than or equal to 3yo preservative free IM  2. Sleep apnea  Ambulatory referral to Pulmonology  3. Diabetic neuropathy      Probable OSA based on history, daytime sleepiness, snoring, apnea per wife  Diabetic neuropathy -- i think neuropathic more than vascular. We discussed starting some neurontin, but pt wants to hold until after his TKA revision, which is reasonable

## 2011-01-06 ENCOUNTER — Other Ambulatory Visit: Payer: Self-pay | Admitting: Family Medicine

## 2011-01-06 MED ORDER — AZITHROMYCIN 250 MG PO TABS: ORAL_TABLET | ORAL | Status: AC

## 2011-01-08 LAB — GLUCOSE, CAPILLARY
Glucose-Capillary: 144 — ABNORMAL HIGH
Glucose-Capillary: 149 — ABNORMAL HIGH
Glucose-Capillary: 153 — ABNORMAL HIGH
Glucose-Capillary: 154 — ABNORMAL HIGH
Glucose-Capillary: 166 — ABNORMAL HIGH
Glucose-Capillary: 170 — ABNORMAL HIGH
Glucose-Capillary: 173 — ABNORMAL HIGH
Glucose-Capillary: 174 — ABNORMAL HIGH
Glucose-Capillary: 230 — ABNORMAL HIGH

## 2011-01-08 LAB — BASIC METABOLIC PANEL
BUN: 15
BUN: 24 — ABNORMAL HIGH
CO2: 27
Calcium: 8.2 — ABNORMAL LOW
Calcium: 8.5
Chloride: 100
Chloride: 98
Creatinine, Ser: 1.65 — ABNORMAL HIGH
Creatinine, Ser: 2.35 — ABNORMAL HIGH
GFR calc Af Amer: 34 — ABNORMAL LOW
GFR calc Af Amer: 52 — ABNORMAL LOW
GFR calc non Af Amer: 28 — ABNORMAL LOW
GFR calc non Af Amer: 57 — ABNORMAL LOW
Potassium: 5
Sodium: 128 — ABNORMAL LOW
Sodium: 133 — ABNORMAL LOW

## 2011-01-08 LAB — HEMOGLOBIN AND HEMATOCRIT, BLOOD
HCT: 22.3 — ABNORMAL LOW
HCT: 24.5 — ABNORMAL LOW
HCT: 30.3 — ABNORMAL LOW
HCT: 32 — ABNORMAL LOW
Hemoglobin: 10.4 — ABNORMAL LOW
Hemoglobin: 7.7 — CL
Hemoglobin: 8.4 — ABNORMAL LOW
Hemoglobin: 9.8 — ABNORMAL LOW

## 2011-01-08 LAB — PROTIME-INR
INR: 1.1
Prothrombin Time: 14.8
Prothrombin Time: 22.2 — ABNORMAL HIGH

## 2011-01-08 LAB — COMPREHENSIVE METABOLIC PANEL
ALT: 22
BUN: 39 — ABNORMAL HIGH
CO2: 26
Calcium: 9.8
Creatinine, Ser: 1.83 — ABNORMAL HIGH
GFR calc non Af Amer: 38 — ABNORMAL LOW
Glucose, Bld: 152 — ABNORMAL HIGH
Sodium: 135
Total Protein: 6.8

## 2011-01-08 LAB — URINALYSIS, ROUTINE W REFLEX MICROSCOPIC
Nitrite: NEGATIVE
Specific Gravity, Urine: 1.02
Urobilinogen, UA: 0.2
pH: 5

## 2011-01-08 LAB — DIFFERENTIAL
Lymphocytes Relative: 24
Lymphs Abs: 1.3
Monocytes Relative: 12
Neutro Abs: 3.4
Neutrophils Relative %: 63

## 2011-01-08 LAB — TYPE AND SCREEN: Antibody Screen: NEGATIVE

## 2011-01-08 LAB — PREPARE RBC (CROSSMATCH)

## 2011-01-08 LAB — CBC
Hemoglobin: 11.7 — ABNORMAL LOW
MCHC: 34.1
MCV: 89.8
RDW: 12.9

## 2011-01-08 LAB — APTT: aPTT: 34

## 2011-01-08 LAB — ABO/RH: ABO/RH(D): A POS

## 2011-01-11 ENCOUNTER — Other Ambulatory Visit: Payer: Self-pay | Admitting: Family Medicine

## 2011-01-11 ENCOUNTER — Other Ambulatory Visit: Payer: Self-pay | Admitting: *Deleted

## 2011-01-11 DIAGNOSIS — E119 Type 2 diabetes mellitus without complications: Secondary | ICD-10-CM

## 2011-01-11 MED ORDER — GLUCOSE BLOOD VI STRP: ORAL_STRIP | Status: DC

## 2011-01-12 ENCOUNTER — Ambulatory Visit (HOSPITAL_BASED_OUTPATIENT_CLINIC_OR_DEPARTMENT_OTHER): Payer: Medicare Other | Attending: Pulmonary Disease

## 2011-01-12 ENCOUNTER — Encounter: Payer: Self-pay | Admitting: Pulmonary Disease

## 2011-01-12 ENCOUNTER — Ambulatory Visit (INDEPENDENT_AMBULATORY_CARE_PROVIDER_SITE_OTHER): Payer: Medicare Other | Admitting: Pulmonary Disease

## 2011-01-12 VITALS — BP 128/60 | HR 76 | Temp 97.8°F | Ht 72.0 in | Wt 254.8 lb

## 2011-01-12 DIAGNOSIS — G4733 Obstructive sleep apnea (adult) (pediatric): Secondary | ICD-10-CM | POA: Insufficient documentation

## 2011-01-12 DIAGNOSIS — G4730 Sleep apnea, unspecified: Secondary | ICD-10-CM | POA: Insufficient documentation

## 2011-01-12 NOTE — Patient Instructions (Signed)
Will setup for a sleep study.  Will arrange followup once results are available Work on weight loss  

## 2011-01-12 NOTE — Progress Notes (Signed)
  Subjective:    Patient ID: [name], male    DOB: Dec 12, 1945, 65 y.o.   [name]: [id number]  HPI The patient is a 65 year old male who I've been asked to see for possible sleep apnea.  He has been noted to have loud snoring, as well as an abnormal breathing pattern during sleep.  He also describes occasional choking arousals.  The patient has some awakenings during the night, and describes nonrestorative sleep.  He states that he feels "tired all of the time".  He notes significant sleep pressure during the day with periods of inactivity, and can fall asleep working on a computer, reading, or watching television.  He denies any sleepiness with driving.  The patient states that his weight is up 25+ pounds in the last 2 years, and Epworth score today is 12.  Sleep Questionnaire: What time do you typically go to bed?( Between what hours) 1:00 am How long does it take you to fall asleep? 5 mins How many times during the night do you wake up? 2 What time do you get out of bed to start your day? 0900 Do you drive or operate heavy machinery in your occupation? No How much has your weight changed (up or down) over the past two years? (In pounds) 20 lb (9.072 kg) Have you ever had a sleep study before? No Do you currently use CPAP? No Do you wear oxygen at any time?     Review of Systems  Constitutional: Negative for fever and unexpected weight change.  HENT: Negative for ear pain, nosebleeds, congestion, sore throat, rhinorrhea, sneezing, trouble swallowing, dental problem, postnasal drip and sinus pressure.   Eyes: Negative for redness and itching.  Respiratory: Positive for shortness of breath. Negative for cough, chest tightness and wheezing.   Cardiovascular: Negative for palpitations and leg swelling.  Gastrointestinal: Negative for nausea and vomiting.  Genitourinary: Negative for dysuria.  Musculoskeletal: Negative for joint swelling.  Skin: Negative for rash.  Neurological: Negative for  headaches.  Hematological: Does not bruise/bleed easily.  Psychiatric/Behavioral: Positive for dysphoric mood. The patient is not nervous/anxious.        Objective:   Physical Exam Constitutional:  Obese male, no acute distress  HENT:  Nares patent without discharge, mild deviation to left  Oropharynx without exudate, palate and uvula are elongated and thickened.   Eyes:  Perrla, eomi, no scleral icterus  Neck:  No JVD, no TMG  Cardiovascular:  Normal rate, regular rhythm, no rubs or gallops.  No murmurs        Intact distal pulses  Pulmonary :  Normal breath sounds, no stridor or respiratory distress   No rales, rhonchi, or wheezing  Abdominal:  Soft, nondistended, bowel sounds present.  No tenderness noted.   Musculoskeletal:  No lower extremity edema noted, ++varicosities.   Lymph Nodes:  No cervical lymphadenopathy noted  Skin:  No cyanosis noted  Neurologic:  Alert, appropriate, moves all 4 extremities without obvious deficit.         Assessment & Plan:

## 2011-01-12 NOTE — Assessment & Plan Note (Signed)
The patient's history is very suggestive of sleep apnea.  He has loud snoring with witnessed apneas, choking arousals at times, nonrestorative sleep, and definite daytime sleepiness.  I have had a long discussion with the pt about sleep apnea, including its impact on QOL and CV health.  I think he needs to have a sleep study at this point, and the patient is agreeable.  I have encouraged him to work aggressively on weight loss, and will arrange followup once his results are available from his sleep study.

## 2011-01-13 ENCOUNTER — Other Ambulatory Visit: Payer: Self-pay | Admitting: *Deleted

## 2011-01-13 MED ORDER — INSULIN GLARGINE 100 UNIT/ML ~~LOC~~ SOLN: 60.0000 [IU] | Freq: Every day | SUBCUTANEOUS | Status: DC

## 2011-01-13 NOTE — Telephone Encounter (Signed)
Pt called and states that he wants Lantus insulin switched to pen instead of vials. Rx sent to [facility] on [address]. Pt informed rx sent.

## 2011-01-14 ENCOUNTER — Telehealth: Payer: Self-pay | Admitting: Endocrinology

## 2011-01-14 MED ORDER — INSULIN GLARGINE 100 UNIT/ML ~~LOC~~ SOLN: 60.0000 [IU] | Freq: Every day | SUBCUTANEOUS | Status: DC

## 2011-01-14 NOTE — Telephone Encounter (Signed)
Called pt on home #. No answer, unable to leave message.

## 2011-01-14 NOTE — Telephone Encounter (Signed)
please call patient: Ins has blocked lantus pen.  i rx'ed vials

## 2011-01-14 NOTE — Telephone Encounter (Signed)
Pt informed

## 2011-01-26 ENCOUNTER — Ambulatory Visit (INDEPENDENT_AMBULATORY_CARE_PROVIDER_SITE_OTHER): Payer: Medicare Other | Admitting: Endocrinology

## 2011-01-26 ENCOUNTER — Encounter: Payer: Self-pay | Admitting: Endocrinology

## 2011-01-26 ENCOUNTER — Telehealth: Payer: Self-pay | Admitting: *Deleted

## 2011-01-26 DIAGNOSIS — E119 Type 2 diabetes mellitus without complications: Secondary | ICD-10-CM

## 2011-01-26 DIAGNOSIS — G4733 Obstructive sleep apnea (adult) (pediatric): Secondary | ICD-10-CM

## 2011-01-26 MED ORDER — INSULIN GLARGINE 100 UNIT/ML ~~LOC~~ SOLN: 50.0000 [IU] | Freq: Every day | SUBCUTANEOUS | Status: DC

## 2011-01-26 MED ORDER — INSULIN LISPRO 100 UNIT/ML ~~LOC~~ SOLN: SUBCUTANEOUS | Status: DC

## 2011-01-26 NOTE — Telephone Encounter (Signed)
Pt needs ov with [facility] to discuss sleep study results.  LM with family member for pt to call back.  

## 2011-01-26 NOTE — Procedures (Signed)
NAME:  REYNOLD, MANTELL NO.:  [id number]  MEDICAL RECORD NO.:  [id number]          PATIENT TYPE:  OUT  LOCATION:  SLEEP CENTER                 FACILITY:  [facility]  PHYSICIAN:  Barbaraann Share, MD,FCCPDATE OF BIRTH:  03/20/46  DATE OF STUDY:  01/12/2011                           NOCTURNAL POLYSOMNOGRAM  REFERRING PHYSICIAN:  Barbaraann Share, MD,FCCP  INDICATION FOR STUDY:  Hypersomnia with sleep apnea.  EPWORTH SLEEPINESS SCORE:  19.  MEDICATIONS:  SLEEP ARCHITECTURE:  The patient had a total sleep time of 325 minutes with no slow-wave sleep and only 32 minutes of REM.  Sleep onset latency was normal at 5 minutes and REM onset was very prolonged at 208 minutes. Sleep efficiency was mildly reduced at 84%.  RESPIRATORY DATA:  The patient was found to have 25 apneas and 119 obstructive hypopneas, giving him an apnea-hypopnea index of 27 events per hour.  The events were nonpositional and there was loud snoring noted throughout.  OXYGEN DATA:  There was O2 desaturation as low as 77% with the patient's obstructive events.  CARDIAC DATA:  No clinically significant arrhythmias were noted.  MOVEMENT-PARASOMNIA:  No significant leg jerks or other abnormal behaviors were seen.  IMPRESSIONS-RECOMMENDATIONS:  Moderate obstructive sleep apnea/hypopnea syndrome with an AHI of 27 events per hour, and O2 desaturation as low as 77%.  Treatment for this degree of sleep apnea can include a trial of weight loss alone, upper airway surgery, dental appliance, and also CPAP.  Clinical correlation is suggested.     Barbaraann Share, MD,FCCP Diplomate, American Board of Sleep Medicine Electronically Signed    [facility]/MEDQ  D:  01/26/2011 08:30:07  T:  01/26/2011 12:11:01  Job:  [id number]

## 2011-01-26 NOTE — Patient Instructions (Addendum)
Continue humalog three times a day (just before each meal) 35-25-50 units.  however, take only 20 units if exertion is anticipated.  If you choose to eat at bedtime, take 15 units of humalog for it.   reduce lantus to 50 units at night.   Please make a follow-up appointment in 3 months.   check your blood sugar 2 times a day.  vary the time of day when you check, between before the 3 meals, and at bedtime.  also check if you have symptoms of your blood sugar being too high or too low.  please keep a record of the readings and bring it to your next appointment here.  please call us sooner if you are having low blood sugar episodes.

## 2011-01-26 NOTE — Telephone Encounter (Signed)
PT MADE APPT FOR 01/28/11 [name]

## 2011-01-26 NOTE — Progress Notes (Signed)
Subjective:    Patient ID: [name], male    DOB: 09-08-1945, 65 y.o.   [name]: [id number]  HPI Since last ov, he has had only 1 episode of hypoglycemia, and this was mild.  It was in the early am, when cbg is in general lowest.  However, hs-snack pushes am-cbg up, even if he takes humalog with it.   Past Medical History  Diagnosis Date  . Depression   . Diabetes mellitus   . Hyperlipidemia   . Hypertension   . Osteoarthritis   . Allergic rhinitis due to pollen   . GERD (gastroesophageal reflux disease)   . Gout   . Diverticulosis     Past Surgical History  Procedure Date  . Cholecystectomy 1980  . Total hip arthroplasty 2009    Dr. Darrelyn Hillock    History   Social History  . Marital Status: Married    Spouse Name: N/A    Number of Children: N/A  . 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 Smoking status: Former Smoker -- 3.0 packs/day for 35 years    Types: Cigarettes    Quit date: 04/12/2000  . Smokeless tobacco: Never Used  . Alcohol Use: No  . Drug Use: No  . Sexually Active: Not on file   Other Topics Concern  . Not on file   Social History Narrative   No regular exercise    Current Outpatient Prescriptions on File Prior to Visit  Medication Sig Dispense Refill  . atenolol (TENORMIN) 100 MG tablet Take 100 mg by mouth daily.        [name] Kitchen b complex vitamins tablet Take 1 tablet by mouth daily.        . cholecalciferol (VITAMIN D) 1000 UNITS tablet Take 1,000 Units by mouth daily.        [name] Kitchen doxazosin (CARDURA) 8 MG tablet TAKE ONE TABLET BY MOUTH EVERY DAY AT BEDTIME  90 tablet  3  . famotidine (PEPCID) 10 MG tablet Take 10 mg by mouth daily.        . fluticasone (FLONASE) 50 MCG/ACT nasal spray Place 2 sprays into the nose daily as needed.       [name] Kitchen glucose blood test strip (Onetouch Ultra) test strips-check fasting blood sugar and after breakfast, lunch, and dinner  150 each  3  .  indomethacin (INDOCIN) 25 MG capsule Take two tablets every 12 hours as needed for gout pain       . Insulin Pen Needle (RELION PEN NEEDLE 31G/8MM) 31G X 8 MM MISC 1 Device by Does not apply route 4 (four) times daily.  120 each  11  . lisinopril-hydrochlorothiazide (PRINZIDE,ZESTORETIC) 20-12.5 MG per tablet Take 1 tablet by mouth daily.        . niacin (NIASPAN) 500 MG CR tablet Take 1 tablet (500 mg total) by mouth at bedtime. Take an aspirin before taking the niaspan tablet  30 tablet  5  . Venlafaxine HCl 225 MG TB24 Take 1 tablet by mouth every other day.         Allergies  Allergen Reactions  . Tetracycline     REACTION: rash    Family History  Problem Relation Age of Onset  . Diabetes Mother   . Diabetes Father   . Lung cancer Sister     lung  . Heart disease Maternal Grandfather   . COPD Sister   .  Heart attack Sister   . Heart disease Sister     BP 122/68  Pulse 82  Temp(Src) 97.9 F (36.6 C) (Oral)  Ht 6' (1.829 m)  Wt 257 lb (116.574 kg)  BMI 34.86 kg/m2  SpO2 94%  Review of Systems Denies loc    Objective:   Physical Exam Pulses: dorsalis pedis intact bilat.   Feet: no deformity.  no ulcer on the feet.  feet are of normal color and temp.  no edema Neuro: sensation is intact to touch on the feet      Assessment & Plan:  DM, The pattern of his cbg's indicates he needs some adjustment in his therapy

## 2011-01-26 NOTE — Telephone Encounter (Signed)
Pt is returning [name]'s call & can be reached at [phone].  [name]

## 2011-01-28 ENCOUNTER — Encounter: Payer: Self-pay | Admitting: Pulmonary Disease

## 2011-01-28 ENCOUNTER — Ambulatory Visit (INDEPENDENT_AMBULATORY_CARE_PROVIDER_SITE_OTHER): Payer: Medicare Other | Admitting: Pulmonary Disease

## 2011-01-28 VITALS — BP 130/72 | HR 105 | Temp 98.0°F | Ht 72.0 in | Wt 260.4 lb

## 2011-01-28 DIAGNOSIS — G4733 Obstructive sleep apnea (adult) (pediatric): Secondary | ICD-10-CM

## 2011-01-28 NOTE — Progress Notes (Signed)
  Subjective:    Patient ID: [name], male    DOB: 01-24-1946, 65 y.o.   [name]: [id number]  HPI The patient comes in today for followup of his recent sleep study.  He was found to have moderate sleep apnea, with an AHI of 27 events per hour.  I have reviewed the study in detail with him, and answered all of his questions.   Review of Systems  Constitutional: Negative for fever and unexpected weight change.  HENT: Positive for congestion and rhinorrhea. Negative for ear pain, nosebleeds, sore throat, sneezing, trouble swallowing, dental problem, postnasal drip and sinus pressure.   Eyes: Positive for itching. Negative for redness.  Respiratory: Negative for cough, chest tightness, shortness of breath and wheezing.   Cardiovascular: Negative for palpitations and leg swelling.  Gastrointestinal: Negative for nausea and vomiting.  Genitourinary: Negative for dysuria.  Musculoskeletal: Negative for joint swelling.  Skin: Negative for rash.  Neurological: Negative for headaches.  Hematological: Does not bruise/bleed easily.  Psychiatric/Behavioral: Negative for dysphoric mood. The patient is not nervous/anxious.        Objective:   Physical Exam Overweight male in no acute distress Nose without purulence or discharge noted Lower extremities without edema, no cyanosis noted Alert and oriented, moves all 4 extremities.       Assessment & Plan:

## 2011-01-28 NOTE — Patient Instructions (Signed)
Will start on cpap.  Please call if having issues with tolerance Work on weight loss followup with me in 5weeks. 

## 2011-01-28 NOTE — Assessment & Plan Note (Signed)
The patient has moderate obstructive sleep apnea by his recent sleep study.  I have reviewed his various treatment options, including weight loss, upper airway surgery, dental appliance, and also CPAP.  I really think CPAP coupled with weight loss represents his best option for successful treatment.  The patient is agreeable to trying this. I will set the patient up on cpap at a moderate pressure level to allow for desensitization, and will troubleshoot the device over the next 4-6weeks if needed.  The pt is to call me if having issues with tolerance.  Will then optimize the pressure once patient is able to wear cpap on a consistent basis.

## 2011-02-01 NOTE — H&P (Signed)
NAMEAQUILLA, [name]:  [id number]  MEDICAL RECORD NO.:  [id number]  LOCATION:                               FACILITY:  [facility]  PHYSICIAN:  Madlyn Frankel. Charlann Boxer, M.D.  DATE OF BIRTH:  06-17-1945  DATE OF ADMISSION:  02/08/2011 DATE OF DISCHARGE:                             HISTORY & PHYSICAL   DATE OF SURGERY:  February 08, 2011.  CHIEF COMPLAINT:  Pain in the right hip, previous ASR hip replacement.  HISTORY OF PRESENT ILLNESS:  The patient is a 65 year old white male, in no acute distress.  The patient states he had a previous ASR right hip replacement in 2009.  The patient states that he had pain in the right hip since that time, has been increasing over the years.  It got to the point where he could not stand the pain.  Recent blood test did show that he had increased levels of cobalt in his blood.  X-rays do show a previous replacement in good position.  Due to increasing pain and increasing blood levels, the patient wishes to proceed with revision of the right hip.  Risks, benefits, and expectations of the procedure were discussed with the patient.  The patient understands the risks, benefits, and expectations and wished to proceed with surgery.  PAST MEDICAL HISTORY: 1. Hypertension. 2. Sleep apnea. 3. Diabetes.  PAST SURGICAL HISTORY: 1. Cholecystectomy. 2. Right hip replacement in 2009.  ALLERGIES:  TETRACYCLINE.  MEDICATIONS: 1. Atenolol 100 mg 1 p.o. daily. 2. B complex vitamin 1 p.o. daily. 3. Vitamin D 1000 units 1 p.o. daily. 4. Cardura 8 mg 1 p.o. at bedtime. 5. Pepcid 10 mg 1 p.o. daily. 6. Flonase 50 mcg/HCT 2 puffs in the nose daily. 7. Indocin 25 mg 2 p.o. q.12 hours as needed for gout pain. 8. Lantus 100 units/mL, inject 60 units to the skin at bedtime. 9. Humalog 100 units/mL, inject in the skin 3 times a day, just before     meals 30-25-50 units. 10.Lisinopril/hydrochlorothiazide 20/12.5 mg 1 p.o. daily. 11.Niaspan 500 mg  p.o. at bedtime. 12.Venlafaxine 225 mg 1 p.o. every other day.  SOCIAL HISTORY:  The patient denies current use of alcohol or tobacco.  REVIEW OF SYSTEMS:  HEENT:  He has dentures and ringing in the ears. RESPIRATORY:  He has shortness of breath on exertion.  Some allergies. GU:  He has weak stream of urine.  MUSCULOSKELETAL:  He has back pain. Otherwise unremarkable.  PRIMARY CARE PHYSICIAN:  Dr. Dallas Schimke.  PHYSICAL EXAMINATION:  GENERAL:  The patient is a 65 year old white male, in no acute distress. VITAL SIGNS:  Stable.  Blood pressure in the right arm was 140/70, respirations 16, and pulse 84. HEENT:  Pupils are equal, round, and reactive to light and accommodation.  Throat is clear.  The patient does have plates on both top and bottom. NECK:  Supple.  No JVD.  No carotid bruits.  No lymphadenopathy noted. CARDIAC:  Normal-appearing S1, S2.  No murmur appreciated. RESPIRATORY:  Lungs are clear to auscultation bilaterally. NEUROLOGIC:  The patient is oriented x3. ORTHOPEDICS:  Pertaining to the right hip, the patient has no pain on palpation of the  lateral aspect of the hip.  The patient does have pain with range of motion and weightbearing.  The patient is distally neurovascular intact.  He has +2 dorsalis pedis pulses.  Good sensation to light touch.  Increased pain to internal and external rotation and flexion.  STUDIES:  X-rays as above.  IMPRESSION:  Pain with a previous right ASR total hip replacement.  PLAN:  The patient will be admitted to the hospital to undergo revision of the right hip per Dr. Charlann Boxer at [facility] on February 08, 2011.  Risks, benefits, and expectations of procedure were discussed with the patient.  The patient understands the risks, benefits, and expectations and wishes to proceed with surgery.    ______________________________ Lanney Gins, PA   ______________________________ Madlyn Frankel. Charlann Boxer, M.D.    MB/MEDQ  D:  01/27/2011   T:  01/27/2011  Job:  [id number]  Electronically Signed by Lanney Gins PA on 01/28/2011 01:03:46 PM Electronically Signed by Durene Romans M.D. on 02/01/2011 12:40:37 PM

## 2011-02-04 ENCOUNTER — Other Ambulatory Visit: Payer: Self-pay | Admitting: Orthopedic Surgery

## 2011-02-04 ENCOUNTER — Encounter (HOSPITAL_COMMUNITY): Payer: Medicare Other

## 2011-02-04 ENCOUNTER — Other Ambulatory Visit (HOSPITAL_COMMUNITY): Payer: Self-pay | Admitting: Orthopedic Surgery

## 2011-02-04 ENCOUNTER — Ambulatory Visit (HOSPITAL_COMMUNITY)
Admission: RE | Admit: 2011-02-04 | Discharge: 2011-02-04 | Disposition: A | Discharge status: Home / Self Care | Payer: Medicare Other | Source: Ambulatory Visit | Attending: Orthopedic Surgery | Admitting: Orthopedic Surgery

## 2011-02-04 DIAGNOSIS — Z01812 Encounter for preprocedural laboratory examination: Secondary | ICD-10-CM | POA: Insufficient documentation

## 2011-02-04 DIAGNOSIS — T8489XA Other specified complication of internal orthopedic prosthetic devices, implants and grafts, initial encounter: Secondary | ICD-10-CM | POA: Insufficient documentation

## 2011-02-04 DIAGNOSIS — Z01818 Encounter for other preprocedural examination: Secondary | ICD-10-CM | POA: Insufficient documentation

## 2011-02-04 DIAGNOSIS — Z96649 Presence of unspecified artificial hip joint: Secondary | ICD-10-CM | POA: Insufficient documentation

## 2011-02-04 DIAGNOSIS — Y832 Surgical operation with anastomosis, bypass or graft as the cause of abnormal reaction of the patient, or of later complication, without mention of misadventure at the time of the procedure: Secondary | ICD-10-CM | POA: Insufficient documentation

## 2011-02-04 LAB — COMPREHENSIVE METABOLIC PANEL
ALT: 18 U/L (ref 0–53)
AST: 18 U/L (ref 0–37)
Albumin: 3.8 g/dL (ref 3.5–5.2)
Chloride: 101 mEq/L (ref 96–112)
Creatinine, Ser: 1.33 mg/dL (ref 0.50–1.35)
Potassium: 4.3 mEq/L (ref 3.5–5.1)
Sodium: 137 mEq/L (ref 135–145)
Total Bilirubin: 0.2 mg/dL — ABNORMAL LOW (ref 0.3–1.2)

## 2011-02-04 LAB — DIFFERENTIAL
Lymphocytes Relative: 24 % (ref 12–46)
Lymphs Abs: 1.5 10*3/uL (ref 0.7–4.0)
Monocytes Relative: 15 % — ABNORMAL HIGH (ref 3–12)
Neutrophils Relative %: 61 % (ref 43–77)

## 2011-02-04 LAB — CBC
HCT: 33.1 % — ABNORMAL LOW (ref 39.0–52.0)
Hemoglobin: 11.1 g/dL — ABNORMAL LOW (ref 13.0–17.0)
MCV: 88.7 fL (ref 78.0–100.0)
RBC: 3.73 MIL/uL — ABNORMAL LOW (ref 4.22–5.81)
WBC: 6.1 10*3/uL (ref 4.0–10.5)

## 2011-02-04 LAB — URINALYSIS, ROUTINE W REFLEX MICROSCOPIC
Ketones, ur: NEGATIVE mg/dL
Leukocytes, UA: NEGATIVE
Nitrite: NEGATIVE
Specific Gravity, Urine: 1.01 (ref 1.005–1.030)
pH: 5 (ref 5.0–8.0)

## 2011-02-04 LAB — SURGICAL PCR SCREEN: Staphylococcus aureus: NEGATIVE

## 2011-02-04 LAB — APTT: aPTT: 31 seconds (ref 24–37)

## 2011-02-08 ENCOUNTER — Inpatient Hospital Stay (HOSPITAL_COMMUNITY)
Admission: RE | Admit: 2011-02-08 | Discharge: 2011-02-10 | DRG: 468 | Disposition: A | Discharge status: Home Health | Payer: Medicare Other | Source: Ambulatory Visit | Attending: Orthopedic Surgery | Admitting: Orthopedic Surgery

## 2011-02-08 ENCOUNTER — Inpatient Hospital Stay (HOSPITAL_COMMUNITY): Payer: Medicare Other

## 2011-02-08 DIAGNOSIS — I10 Essential (primary) hypertension: Secondary | ICD-10-CM | POA: Diagnosis present

## 2011-02-08 DIAGNOSIS — Z01812 Encounter for preprocedural laboratory examination: Secondary | ICD-10-CM

## 2011-02-08 DIAGNOSIS — Z794 Long term (current) use of insulin: Secondary | ICD-10-CM

## 2011-02-08 DIAGNOSIS — R7989 Other specified abnormal findings of blood chemistry: Secondary | ICD-10-CM | POA: Diagnosis present

## 2011-02-08 DIAGNOSIS — G4730 Sleep apnea, unspecified: Secondary | ICD-10-CM | POA: Diagnosis present

## 2011-02-08 DIAGNOSIS — D649 Anemia, unspecified: Secondary | ICD-10-CM | POA: Diagnosis present

## 2011-02-08 DIAGNOSIS — E119 Type 2 diabetes mellitus without complications: Secondary | ICD-10-CM | POA: Diagnosis present

## 2011-02-08 DIAGNOSIS — E669 Obesity, unspecified: Secondary | ICD-10-CM | POA: Diagnosis present

## 2011-02-08 DIAGNOSIS — Y831 Surgical operation with implant of artificial internal device as the cause of abnormal reaction of the patient, or of later complication, without mention of misadventure at the time of the procedure: Secondary | ICD-10-CM | POA: Diagnosis present

## 2011-02-08 DIAGNOSIS — Z96649 Presence of unspecified artificial hip joint: Secondary | ICD-10-CM

## 2011-02-08 DIAGNOSIS — T8489XA Other specified complication of internal orthopedic prosthetic devices, implants and grafts, initial encounter: Principal | ICD-10-CM | POA: Diagnosis present

## 2011-02-08 LAB — GRAM STAIN: Gram Stain: NONE SEEN

## 2011-02-08 LAB — TYPE AND SCREEN: Antibody Screen: NEGATIVE

## 2011-02-08 LAB — GLUCOSE, CAPILLARY
Glucose-Capillary: 231 mg/dL — ABNORMAL HIGH (ref 70–99)
Glucose-Capillary: 232 mg/dL — ABNORMAL HIGH (ref 70–99)
Glucose-Capillary: 250 mg/dL — ABNORMAL HIGH (ref 70–99)

## 2011-02-09 LAB — CBC
MCV: 89.3 fL (ref 78.0–100.0)
Platelets: 146 10*3/uL — ABNORMAL LOW (ref 150–400)
RBC: 3.18 MIL/uL — ABNORMAL LOW (ref 4.22–5.81)
RDW: 12.9 % (ref 11.5–15.5)
WBC: 7.3 10*3/uL (ref 4.0–10.5)

## 2011-02-09 LAB — GLUCOSE, CAPILLARY
Glucose-Capillary: 185 mg/dL — ABNORMAL HIGH (ref 70–99)
Glucose-Capillary: 191 mg/dL — ABNORMAL HIGH (ref 70–99)
Glucose-Capillary: 98 mg/dL (ref 70–99)
Glucose-Capillary: 158 mg/dL — ABNORMAL HIGH (ref 70–99)

## 2011-02-09 LAB — BASIC METABOLIC PANEL
CO2: 25 mEq/L (ref 19–32)
Chloride: 98 mEq/L (ref 96–112)
Creatinine, Ser: 1.53 mg/dL — ABNORMAL HIGH (ref 0.50–1.35)
GFR calc Af Amer: 53 mL/min — ABNORMAL LOW (ref >90)
Potassium: 4.3 mEq/L (ref 3.5–5.1)

## 2011-02-10 LAB — BASIC METABOLIC PANEL
BUN: 25 mg/dL — ABNORMAL HIGH (ref 6–23)
Chloride: 100 mEq/L (ref 96–112)
GFR calc Af Amer: 41 mL/min — ABNORMAL LOW (ref >90)
GFR calc non Af Amer: 35 mL/min — ABNORMAL LOW (ref >90)
Potassium: 4.5 mEq/L (ref 3.5–5.1)
Sodium: 132 mEq/L — ABNORMAL LOW (ref 135–145)

## 2011-02-10 LAB — CBC
HCT: 27.9 % — ABNORMAL LOW (ref 39.0–52.0)
MCHC: 33 g/dL (ref 30.0–36.0)
Platelets: 156 10*3/uL (ref 150–400)
RDW: 13.1 % (ref 11.5–15.5)
WBC: 8 10*3/uL (ref 4.0–10.5)

## 2011-02-10 LAB — GLUCOSE, CAPILLARY: Glucose-Capillary: 149 mg/dL — ABNORMAL HIGH (ref 70–99)

## 2011-02-11 LAB — BODY FLUID CULTURE: Gram Stain: NONE SEEN

## 2011-02-11 LAB — ANAEROBIC CULTURE

## 2011-02-11 NOTE — Op Note (Signed)
[name], [name]:  [id number]  MEDICAL RECORD NO.:  [id number]  LOCATION:  1609                         FACILITY:  [facility]  PHYSICIAN:  Madlyn Frankel. Charlann Boxer, M.D.  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  FINDINGS:  The patient does not have any significant synovial staining. There was noted to be a rather large synovial fluid collection, however.  PROCEDURE:  Revision right total hip replacement utilizing DePuy component of size 58, revision pinnacle Gription cup 36+ 4 neutral liner single cancellous screw and 36+ 5 delta ceramic ball with a "TS liner sleeve."  SURGEON:  Madlyn Frankel. Charlann Boxer, M.D.  ASSISTANT:  Lanney Gins, PA.  Lanney Gins, physician assistant was present for the entirety of the case including preoperative position, perioperative retractor management, also utilized for general facilitation of the case as well as primary wound closure.  ESTIMATED BLOOD LOSS:  200 mL.  DRAINS:  One Hemovac.  COMPLICATION:  None.  SPECIMENS:  I did send joint fluid off to make certain it is stable. The joint fluid did appear clear.  There were no results at the time of the end of the case.  INDICATION FOR PROCEDURE:  [name] is a 65 year old gentleman with a history of right total hip replacement in 2009.  He had been seen and evaluated with increasing discomfort with activity.  He had elevating serum cobalt and chromium levels in addition to fluid collection by MRI. Based on the persistent pain, increase in discomfort, decreased __________ and findings noted in the preoperative workup, it was decided to proceed  with a revision surgery.  The risks and benefits were discussed and reviewed.  Consent was obtained for benefit of pain relief.  PROCEDURE IN DETAIL:  The patient was brought to the operative theater. Once adequate anesthesia, preoperative antibiotics, and Ancef were administered, the patient was positioned in the left lateral decubitus position right side up.  The right lower extremity was then prepped and draped in sterile fashion.  Time-out was performed identifying the patient, planned procedure, and extremity.  The patient's old incision was utilized.  Sharp dissection was carried to the iliotibial band and gluteal fascia, noted tissues consistent with his diagnosis of diabetes.  The iliotibial band was then incised as well as the postop and gluteus maximus further posteriorly.  At this point, there was significant progress in the case as far as debridement, an L capsulotomy was made through the pseudocapsule and significant synovectomy was carried out.  There was noted to be an abundant amount of clear synovial fluid.  There was no significant amount of staining.  Following this debridement about the two-third posterior aspect of the hip, from proximal to distal, I was able to dislocate the head and remove the femoral head.  The patient wished to have the head and cup, so they were  cleaned once removed and given to the patient.  Please note that cultures were obtained with swabs, the joint fluid was visualized.  Once I was able to remove the femoral head, I was able to place it on to the ilium, which I had elevated the gluteus tissues off and placed retractors.  Following further debridement, I was able to use 0.25 inch osteotome on the superior aspect of the cup and a three-quarter inch or half inch around the inferior aspect of the cup was removed.  There was  no significant bone loss.  Again, retractors were placed.  Further debridement was carried out to allow for  visualization for reaming purposes.  I did begin reaming with a 50 reamer, reamed down a little bit more medial to the medial wall and then reamed up to 57 reamer.  With this, we had good bony bed preparation.  No concern for any bony abnormalities or infection.  A 58 pinnacle cup was chosen.  It was subsequently impacted, it appeared to be positioned to about 35 to 40 degrees of abduction, 20 degrees of flexion was beneath the anterior wall anteriorly.  A single cancellous screw was placed.  I did place a 36+ 4 trial liner.  The combined anteversion appeared to be about 45 to 50 degrees.  There was no evidence any subluxation.  I did enough trialing with a +5 ball and chose this as my final ball.  The final 36+ 4 Ultrex liner was impacted after dislocating the hip and reducing and removing the trials, the final 36+ 4 neutral liner was impacted in place.  The final 36+ 5 ball was impacted on a clean and dry trunnion with the TS sleeve.  The hip was reduced.  The hip had been irrigated throughout the case and again at this point.  I placed a medium Hemovac drain deep.  There was no real capsular tissue to reapproximate based on his the scar debridement required for the visualization.  The iliotibial band and gluteal fascia were subsequently reapproximated using #1 Vicryl.  The remaining wound was closed with 2-0 Vicryl, running 3-0 Monocryl.  The hip was cleaned, dried, dressed sterilely with Dermabond and Aquacel dressing.  He was then extubated and brought to recovery room in stable condition, tolerating the procedure well.     Madlyn Frankel Charlann Boxer, M.D.     MDO/MEDQ  D:  02/08/2011  T:  02/08/2011  Job:  [id number]  Electronically Signed by Durene Romans M.D. on 02/11/2011 11:16:53 AM

## 2011-02-11 NOTE — Discharge Summary (Signed)
[name], [name]:  [id number]  MEDICAL RECORD NO.:  [id number]  LOCATION:  1609                         FACILITY:  [facility]  PHYSICIAN:  Madlyn Frankel. Charlann Boxer, M.D.  DATE OF BIRTH:  28-Feb-1946  DATE OF ADMISSION:  02/08/2011 DATE OF DISCHARGE:  02/10/2011                              DISCHARGE SUMMARY   PROCEDURE:  Revision of the right total hip arthroplasty.  ATTENDING PHYSICIAN:  Madlyn Frankel. Charlann Boxer, M.D.  ADMITTING DIAGNOSIS:  Pain in the right hip, previous ASR hip replacement.  DISCHARGE DIAGNOSES: 1. 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 2. Hypertension. 3. Sleep apnea. 4. Diabetes.  HISTORY OF PRESENT ILLNESS:  Patient is a 65 year old white male, in no acute distress.  Patient states he had previous ASR hip replacement in 2009.  Patient states that he has had pain in the right hip since that time, it has been increasing over the years.  It has  got to the point where he can no longer stand the pain.  Recent blood test did show that he had increased levels of cobalt in the blood.  X-rays do show previous replacement in good position.  Due to the increased pain and increasing blood levels, the patient wished to proceed with revision of the right hip.  Risks, benefits, and expectation of procedure were discussed with the patient.  Patient understands risks, benefits, and expectations and wished to proceed.  HOSPITAL COURSE:  The patient underwent the above-stated procedure on February 08, 2011.  Patient tolerated the procedure well, was brought to the recovery room in good condition, and subsequently to the floor.  Postop day 1, February 09, 2011, patient doing well, no events.  Pain is well-controlled.  He is afebrile.  Vital signs stable.  H and H is 9.5/28.4.  He is distally neurovascularly intact.  Dressing is good, clean, dry and intact.  Patient had physical therapy and did well with this.  Hemovac drain was removed and IV  was changed to saline lock.  Postop day 2, February 10, 2011, the patient doing really well, no events.  Patient states the pain is well-controlled.  He is afebrile, vital signs stable.  H and H is 9.2/27.9.  He is distally neurovascularly intact.  Dressing is good, clean, dry, and intact.  The patient again did well with physical therapy.  It is felt the patient was doing well enough to be discharged home with home health PT.  DISCHARGE CONDITION:  Good.  DISCHARGE INSTRUCTIONS:  Patient will be discharged home with home health PT and weightbearing as tolerated.  The patient will maintain his surgical dressing for about 8 days after which time he will replace with gauze and tape.  Patient to keep the area dry and clean until followup. Patient will follow up in 2 weeks at [facility].  Patient is to call with any questions or concerns.  DISCHARGE MEDICATIONS: 1. Aspirin enteric-coated 325 mg one p.o. b.i.d. x4 weeks. 2. Benadryl 25 mg one p.o. q.4 hours p.r.n. 3. Colace 100 mg one p.o. b.i.d. constipation.4. Iron sulfate 325 mg one p.o. t.i.d. x2-3 weeks. 5. Norco 7.5/325, 1-2 p.o. q.4-6 hours p.r.n. pain. 6.  Robaxin 500 mg one p.o. q.6 hours p.r.n. muscle spasms. 7. MiraLAX 17 g p.o. b.i.d. constipation. 8. Atenolol 100 mg one p.o. q.a.m. 9. Doxazosin 8 mg one p.o. q.h.s. 10.Humalog insulin 35 units at breakfast, 25 units at lunch, 15 units     at dinner, 15 units at bedtime. 11.Lantus 50 units subcu q.h.s. 12.Lisinopril/HCTZ 20/12.5 mg one p.o. q.a.m. 13.Niacin 500 mg one p.o. q.h.s. 14.Pepcid 10 mg one p.o. q. day. 15.Simvastatin 40 mg one p.o. q.h.s. 16.Venlafaxine XR 225 mg half p.o. q.h.s. 17.Vitamin B complex with C one p.o. q. day.    ______________________________ Lanney Gins, PA   ______________________________ Madlyn Frankel. Charlann Boxer, M.D.    MB/MEDQ  D:  02/10/2011  T:  02/10/2011  Job:  [id number]  Electronically Signed by Lanney Gins PA on  02/11/2011 10:33:07 AM Electronically Signed by Durene Romans M.D. on 02/11/2011 11:16:59 AM

## 2011-03-09 ENCOUNTER — Ambulatory Visit (INDEPENDENT_AMBULATORY_CARE_PROVIDER_SITE_OTHER): Payer: Medicare Other | Admitting: Pulmonary Disease

## 2011-03-09 ENCOUNTER — Encounter: Payer: Self-pay | Admitting: Pulmonary Disease

## 2011-03-09 VITALS — BP 122/70 | HR 82 | Temp 98.0°F | Ht 72.0 in | Wt 254.2 lb

## 2011-03-09 DIAGNOSIS — G4733 Obstructive sleep apnea (adult) (pediatric): Secondary | ICD-10-CM | POA: Insufficient documentation

## 2011-03-09 NOTE — Progress Notes (Signed)
  Subjective:    Patient ID: [name], male    DOB: [birth year], 65 y.o.   [name]: [id number]  HPI The patient comes in today for followup of his obstructive sleep apnea.  He was started on CPAP at the last visit, and has done well with the device.  He denies any issues with his mask fit or pressure, and feels that he is sleeping better with improved daytime alertness.   Review of Systems  Constitutional: Negative for fever and unexpected weight change.  HENT: Negative for ear pain, nosebleeds, congestion, sore throat, rhinorrhea, sneezing, trouble swallowing, dental problem, postnasal drip and sinus pressure.   Eyes: Negative for redness and itching.  Respiratory: Negative for cough, chest tightness, shortness of breath and wheezing.   Cardiovascular: Negative for palpitations and leg swelling.  Gastrointestinal: Negative for nausea and vomiting.  Genitourinary: Negative for dysuria.  Musculoskeletal: Negative for joint swelling.  Skin: Negative for rash.  Neurological: Negative for headaches.  Hematological: Does not bruise/bleed easily.  Psychiatric/Behavioral: Negative for dysphoric mood. The patient is not nervous/anxious.        Objective:   Physical Exam Overweight male in no acute distress No skin breakdown or pressure necrosis from the CPAP mask Lower extremities with mild edema, no cyanosis noted Alert and oriented, moves all 4 extremities.       Assessment & Plan:

## 2011-03-09 NOTE — Assessment & Plan Note (Signed)
The patient is doing well since starting on CPAP, and has seen improvement in both his sleep and his daytime alertness.  I have encouraged him to work aggressively on weight loss, and will need to optimize his pressure setting as well. Care Plan:  At this point, will arrange for the patient's machine to be changed over to auto mode for 2 weeks to optimize their pressure.  I will review the downloaded data once sent by dme, and also evaluate for compliance, leaks, and residual osa.  I will call the patient and dme to discuss the results, and have the patient's machine set appropriately.  This will serve as the pt's cpap pressure titration.  

## 2011-03-09 NOTE — Assessment & Plan Note (Signed)
The patient is doing well since starting on CPAP, and has seen improvement in both his sleep and his daytime alertness.  I have encouraged him to work aggressively on weight loss, and will need to optimize his pressure setting as well. Care Plan:  At this point, will arrange for the patient's machine to be changed over to auto mode for 2 weeks to optimize their pressure.  I will review the downloaded data once sent by dme, and also evaluate for compliance, leaks, and residual osa.  I will call the patient and dme to discuss the results, and have the patient's machine set appropriately.  This will serve as the pt's cpap pressure titration.

## 2011-03-09 NOTE — Patient Instructions (Signed)
Will optimize cpap pressure for you, and let you know the results. Work on weight loss followup with me in 6mos if doing well.

## 2011-03-12 ENCOUNTER — Telehealth: Payer: Self-pay | Admitting: *Deleted

## 2011-03-12 NOTE — Telephone Encounter (Signed)
Opened in error

## 2011-04-15 ENCOUNTER — Other Ambulatory Visit: Payer: Self-pay | Admitting: Family Medicine

## 2011-04-21 DIAGNOSIS — L708 Other acne: Secondary | ICD-10-CM | POA: Diagnosis not present

## 2011-04-21 DIAGNOSIS — L821 Other seborrheic keratosis: Secondary | ICD-10-CM | POA: Diagnosis not present

## 2011-04-26 ENCOUNTER — Ambulatory Visit (INDEPENDENT_AMBULATORY_CARE_PROVIDER_SITE_OTHER): Payer: Medicare Other | Admitting: Family Medicine

## 2011-04-26 ENCOUNTER — Encounter: Payer: Self-pay | Admitting: Family Medicine

## 2011-04-26 DIAGNOSIS — Z125 Encounter for screening for malignant neoplasm of prostate: Secondary | ICD-10-CM | POA: Diagnosis not present

## 2011-04-26 DIAGNOSIS — E785 Hyperlipidemia, unspecified: Secondary | ICD-10-CM

## 2011-04-26 DIAGNOSIS — I10 Essential (primary) hypertension: Secondary | ICD-10-CM

## 2011-04-26 DIAGNOSIS — N400 Enlarged prostate without lower urinary tract symptoms: Secondary | ICD-10-CM | POA: Diagnosis not present

## 2011-04-26 MED ORDER — FLUTICASONE PROPIONATE 50 MCG/ACT NA SUSP: 2.0000 | Freq: Every day | NASAL | Status: DC

## 2011-04-26 MED ORDER — NIACIN ER (ANTIHYPERLIPIDEMIC) 1000 MG PO TBCR: 500.0000 mg | EXTENDED_RELEASE_TABLET | Freq: Every day | ORAL | Status: DC

## 2011-04-26 MED ORDER — VENLAFAXINE HCL ER 75 MG PO CP24: 225.0000 mg | ORAL_CAPSULE | Freq: Every day | ORAL | Status: DC

## 2011-04-26 NOTE — Progress Notes (Signed)
  Patient Name: [name] Date of Birth: Apr 10, 1946 Age: 66 y.o. Medical Record Number: [id number] Gender: male Date of Encounter: 04/26/2011  History of Present Illness:  [name] is a 66 y.o. very pleasant male patient who presents with the following:  HTN: Tolerating all medications without side effects Stable and at goal No CP, no sob. No HA.  BP Readings from Last 3 Encounters:  04/26/11 120/60  03/09/11 122/70  01/28/11 130/72    Basic Metabolic Panel:    Component Value Date/Time   NA 132* 02/10/2011 0500   K 4.5 02/10/2011 0500   CL 100 02/10/2011 0500   CO2 26 02/10/2011 0500   BUN 25* 02/10/2011 0500   CREATININE 1.91* 02/10/2011 0500   GLUCOSE 161* 02/10/2011 0500   CALCIUM 9.0 02/10/2011 0500    Lipids: tolerating statin as well as his niacin. Panel reviewed with patient.  Lipids:    Component Value Date/Time   CHOL 161 09/16/2010 1215   TRIG 610.0 Triglyceride is over 400; calculations on Lipids are invalid.* 09/16/2010 1215   HDL 34.50* 09/16/2010 1215   LDLDIRECT 64.4 09/16/2010 1215   VLDL 122.0* 09/16/2010 1215   CHOLHDL 5 09/16/2010 1215    Lab Results  Component Value Date   ALT 18 02/04/2011   AST 18 02/04/2011   ALKPHOS 75 02/04/2011   BILITOT 0.2* 02/04/2011   Diabetes Mellitus: Tolerating Medications: he has not been very compliant with his diet over the Christmas holiday. He thinks he has gained some weight. Looking at his chart he has actually lost some weight. He is seeing Dr. Everardo All within the next one to 2 days.  Lab Results  Component Value Date   HGBA1C 8.2* 12/17/2010    Wt Readings from Last 3 Encounters:  04/26/11 251 lb 1.9 oz (113.907 kg)  03/09/11 254 lb 3.2 oz (115.304 kg)  01/28/11 260 lb 6.4 oz (118.117 kg)    Body mass index is 34.06 kg/(m^2).   Past Medical History, Surgical History, Social History, Family History, Problem List, Medications, and Allergies have been reviewed and updated if relevant.  Review  of Systems:  GEN: No acute illnesses, no fevers, chills. GI: No n/v/d, eating normally Pulm: No SOB Interactive and getting along well at home.  Otherwise, ROS is as per the HPI.   Physical Examination: Filed Vitals:   04/26/11 1118  BP: 120/60  Pulse: 100  Temp: 98.4 F (36.9 C)  TempSrc: Oral  Height: 6' (1.829 m)  Weight: 251 lb 1.9 oz (113.907 kg)  SpO2: 94%    Body mass index is 34.06 kg/(m^2).   GEN: WDWN, NAD, Non-toxic, A & O x 3 HEENT: Atraumatic, Normocephalic. Neck supple. No masses, No LAD. Ears and Nose: No external deformity. CV: RRR, No M/G/R. No JVD. No thrill. No extra heart sounds. PULM: CTA B, no wheezes, crackles, rhonchi. No retractions. No resp. distress. No accessory muscle use. EXTR: No c/c/e NEURO Normal gait.  PSYCH: Normally interactive. Conversant. Not depressed or anxious appearing.  Calm demeanor.    Assessment and Plan: 1. HYPERTENSION  Hepatic function panel  2. HYPERLIPIDEMIA  Lipid panel  3. Special screening for malignant neoplasm of prostate  PSA  4. Enlarged prostate  PSA    Increase Niaspan dosing.

## 2011-04-27 ENCOUNTER — Other Ambulatory Visit: Payer: Medicare Other

## 2011-04-27 ENCOUNTER — Encounter: Payer: Self-pay | Admitting: Endocrinology

## 2011-04-27 ENCOUNTER — Other Ambulatory Visit (INDEPENDENT_AMBULATORY_CARE_PROVIDER_SITE_OTHER): Payer: Medicare Other

## 2011-04-27 ENCOUNTER — Ambulatory Visit (INDEPENDENT_AMBULATORY_CARE_PROVIDER_SITE_OTHER): Payer: Medicare Other | Admitting: Endocrinology

## 2011-04-27 ENCOUNTER — Other Ambulatory Visit: Payer: Self-pay | Admitting: Endocrinology

## 2011-04-27 VITALS — BP 122/64 | HR 85 | Temp 98.2°F | Wt 255.0 lb

## 2011-04-27 DIAGNOSIS — N400 Enlarged prostate without lower urinary tract symptoms: Secondary | ICD-10-CM

## 2011-04-27 DIAGNOSIS — I10 Essential (primary) hypertension: Secondary | ICD-10-CM

## 2011-04-27 DIAGNOSIS — E119 Type 2 diabetes mellitus without complications: Secondary | ICD-10-CM | POA: Diagnosis not present

## 2011-04-27 DIAGNOSIS — Z125 Encounter for screening for malignant neoplasm of prostate: Secondary | ICD-10-CM

## 2011-04-27 DIAGNOSIS — E785 Hyperlipidemia, unspecified: Secondary | ICD-10-CM

## 2011-04-27 LAB — HEPATIC FUNCTION PANEL
ALT: 18 U/L (ref 0–53)
Total Protein: 6.6 g/dL (ref 6.0–8.3)

## 2011-04-27 LAB — LIPID PANEL: Cholesterol: 129 mg/dL (ref 0–200)

## 2011-04-27 LAB — PSA: PSA: 0.55 ng/mL (ref 0.10–4.00)

## 2011-04-27 MED ORDER — INSULIN NPH (HUMAN) (ISOPHANE) 100 UNIT/ML ~~LOC~~ SUSP: 40.0000 [IU] | Freq: Every day | SUBCUTANEOUS | Status: DC

## 2011-04-27 MED ORDER — INSULIN REGULAR HUMAN 100 UNIT/ML IJ SOLN: INTRAMUSCULAR | Status: DC

## 2011-04-27 NOTE — Progress Notes (Signed)
Subjective:    Patient ID: [name], male    DOB: 05-Jun-1945, 66 y.o.   [name]: [id number]  HPI pt returns for f/u of insulin-requiring DM (1997).  He says the insulin is too expensive.  pt states he feels well in general. Past Medical History  Diagnosis Date  . Depression   . Diabetes mellitus   . Hyperlipidemia   . Hypertension   . Osteoarthritis   . Allergic rhinitis due to pollen   . GERD (gastroesophageal reflux disease)   . Gout   . Diverticulosis     Past Surgical History  Procedure Date  . Cholecystectomy 1980  . Total hip arthroplasty 2009    Dr. Darrelyn Hillock  . Total hip arthroplasty     History   Social History  . Marital Status: Married    Spouse Name: N/A    Number of Children: N/A  . 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 Smoking status: Former Smoker -- 3.0 packs/day for 35 years    Types: Cigarettes    Quit date: 04/12/2000  . Smokeless tobacco: Never Used  . Alcohol Use: No  . Drug Use: No  . Sexually Active: Not on file   Other Topics Concern  . Not on file   Social History Narrative   No regular exercise    Current Outpatient Prescriptions on File Prior to Visit  Medication Sig Dispense Refill  . ampicillin (PRINCIPEN) 500 MG capsule Take 500 mg by mouth 4 (four) times daily.      [name] Kitchen aspirin 325 MG tablet Take 325 mg by mouth daily.        [name] Kitchen atenolol (TENORMIN) 100 MG tablet TAKE ONE TABLET BY MOUTH EVERY DAY  90 tablet  3  . b complex vitamins tablet Take 1 tablet by mouth daily.        . cholecalciferol (VITAMIN D) 1000 UNITS tablet Take 1,000 Units by mouth daily.        [name] Kitchen doxazosin (CARDURA) 8 MG tablet TAKE ONE TABLET BY MOUTH EVERY DAY AT BEDTIME  90 tablet  3  . famotidine (PEPCID) 10 MG tablet Take 10 mg by mouth daily.        . fluticasone (FLONASE) 50 MCG/ACT nasal spray Place 2 sprays into the nose daily.  16 g  prn  . glucose blood test strip  (Onetouch Ultra) test strips-check fasting blood sugar and after breakfast, lunch, and dinner  150 each  3  . indomethacin (INDOCIN) 25 MG capsule Take two tablets every 12 hours as needed for gout pain       . insulin glargine (LANTUS) 100 UNIT/ML injection Inject 50 Units into the skin at bedtime. And syringes 1/day  20 mL  11  . insulin lispro (HUMALOG) 100 UNIT/ML injection Inject into the skin  4x a day (just before each meal) 35-25-50-15 units  45 mL  11  . Insulin Pen Needle (RELION PEN NEEDLE 31G/8MM) 31G X 8 MM MISC 1 Device by Does not apply route 4 (four) times daily.  120 each  11  . lisinopril-hydrochlorothiazide (PRINZIDE,ZESTORETIC) 20-12.5 MG per tablet TAKE ONE TABLET BY MOUTH EVERY DAY  90 tablet  3  . niacin (NIASPAN) 1000 MG CR tablet Take 1 tablet (1,000 mg total) by mouth at bedtime. Take an aspirin before taking the niaspan tablet  30 tablet  5  . venlafaxine (EFFEXOR  XR) 75 MG 24 hr capsule Take 3 capsules (225 mg total) by mouth daily.  90 capsule  3    Allergies  Allergen Reactions  . Tetracycline     REACTION: rash    Family History  Problem Relation Age of Onset  . Diabetes Mother   . Diabetes Father   . Lung cancer Sister     lung  . Heart disease Maternal Grandfather   . COPD Sister   . Heart attack Sister   . Heart disease Sister     There were no vitals taken for this visit.  Review of Systems denies hypoglycemia    Objective:   Physical Exam VITAL SIGNS:  See vs page GENERAL: no distress SKIN:  Insulin injection sites at the anterior abdomen are normal     Assessment & Plan:  DM, The pattern of his cbg's indicates he needs some adjustment in his therapy.  He also requests cheaper insulin.

## 2011-04-27 NOTE — Patient Instructions (Addendum)
Change humalog to regular insulin, three times a day (just before each meal) 45-35-60 units.  however, take only 20 units if exertion is anticipated.  If you choose to eat at bedtime, take 15 units of regular insulin for it.   change lantus to nph, 40 units at night.   Please make a follow-up appointment in 6 weeks.   check your blood sugar 2 times a day.  vary the time of day when you check, between before the 3 meals, and at bedtime.  also check if you have symptoms of your blood sugar being too high or too low.  please keep a record of the readings and bring it to your next appointment here.  please call us sooner if you are having low blood sugar episodes.

## 2011-05-09 ENCOUNTER — Other Ambulatory Visit: Payer: Self-pay | Admitting: Pulmonary Disease

## 2011-05-09 DIAGNOSIS — G4733 Obstructive sleep apnea (adult) (pediatric): Secondary | ICD-10-CM

## 2011-05-26 ENCOUNTER — Ambulatory Visit (INDEPENDENT_AMBULATORY_CARE_PROVIDER_SITE_OTHER): Payer: Medicare Other | Admitting: Family Medicine

## 2011-05-26 ENCOUNTER — Telehealth: Payer: Self-pay | Admitting: Family Medicine

## 2011-05-26 ENCOUNTER — Encounter: Payer: Self-pay | Admitting: Family Medicine

## 2011-05-26 VITALS — BP 140/70 | HR 110 | Temp 98.4°F | Ht 72.0 in | Wt 258.1 lb

## 2011-05-26 DIAGNOSIS — N23 Unspecified renal colic: Secondary | ICD-10-CM | POA: Diagnosis not present

## 2011-05-26 DIAGNOSIS — R319 Hematuria, unspecified: Secondary | ICD-10-CM | POA: Diagnosis not present

## 2011-05-26 DIAGNOSIS — R300 Dysuria: Secondary | ICD-10-CM | POA: Diagnosis not present

## 2011-05-26 LAB — POCT URINALYSIS DIPSTICK
Glucose, UA: NEGATIVE
Ketones, UA: NEGATIVE
Spec Grav, UA: 1.02
Urobilinogen, UA: 0.2

## 2011-05-26 MED ORDER — CIPROFLOXACIN HCL 500 MG PO TABS: 500.0000 mg | ORAL_TABLET | Freq: Two times a day (BID) | ORAL | Status: AC

## 2011-05-26 NOTE — Telephone Encounter (Signed)
Someone needs to see

## 2011-05-26 NOTE — Progress Notes (Signed)
  Patient Name: [name] Date of Birth: 07-18-1945 Age: 66 y.o. Medical Record Number: [id number] Gender: male Date of Encounter: 05/26/2011  History of Present Illness:  [name] is a 66 y.o. very pleasant male patient who presents with the following:  Acute R kidney pain, significant pain with urination. No blood grossly. Feeling like he needs to go to the bathroom all the time. No hx of kidney stones or multiple UTI  Past Medical History, Surgical History, Social History, Family History, Problem List, Medications, and Allergies have been reviewed and updated if relevant.  Review of Systems: ROS: GEN: Acute illness details above GI: Tolerating PO intake GU: maintaining adequate hydration and urination Pulm: No SOB Interactive and getting along well at home.  Otherwise, ROS is as per the HPI.   Physical Examination: Filed Vitals:   05/26/11 1117  BP: 140/70  Pulse: 110  Temp: 98.4 F (36.9 C)  TempSrc: Oral  Height: 6' (1.829 m)  Weight: 258 lb 1.9 oz (117.082 kg)  SpO2: 98%    Body mass index is 35.01 kg/(m^2).   GEN: WDWN, NAD, Non-toxic, A & O x 3 HEENT: Atraumatic, Normocephalic. Neck supple. No masses, No LAD. Ears and Nose: No external deformity. CV: RRR, No M/G/R. No JVD. No thrill. No extra heart sounds. PULM: CTA B, no wheezes, crackles, rhonchi. No retractions. No resp. distress. No accessory muscle use. R CVAT EXTR: No c/c/e NEURO Normal gait.  PSYCH: Normally interactive. Conversant. Not depressed or anxious appearing.  Calm demeanor.    Assessment and Plan: 1. Kidney pain  POCT Urinalysis Dipstick, ciprofloxacin (CIPRO) 500 MG tablet, Urine culture  2. Dysuria  ciprofloxacin (CIPRO) 500 MG tablet, Urine culture  3. Hematuria  ciprofloxacin (CIPRO) 500 MG tablet, Urine culture    Suspect pyelo cannot rule out kidney stone Abx, he will call me on fri

## 2011-05-26 NOTE — Telephone Encounter (Signed)
Triage Record Num: [id number] Operator: Geanie Berlin Patient Name: [name] Call Date & Time: 05/26/2011 9:40:19AM Patient Phone: [phone] PCP: Hannah Beat Patient Gender: Male PCP Fax : [phone] Patient DOB: 03-Dec-1945 Practice Name: [facility] Reason for Call: Caller: [name]/Patient; PCP: Juleen China.; CB#: [phone]; Call regarding Dysuria, Urgency, and frequency. Onset: 05/25/11. Afebrile. Denies hematuria; urine clear yellow. FBS 311. Bilateral flank pain present since 05/25/11. Has takeN morning insulin. Instructed to drink water and retest blood sugar. Advised to see MD within 4 hrs for flank pain and urinary tract symptoms per Flank Pain Guideline. Unable to access EPIC; contacted office scheduler/Carrie who reported no appts remain for 05/26/11. Instructed RN to send note to MD who will decide. Informed pt that office will call pt back to advise where/when to be seen. OFFICE PLEASE FOLLOW UP ASAP. Protocol(s) Used: Flank Pain Protocol(s) Used: Urinary Symptoms - Male Recommended Outcome per Protocol: See Provider within 4 hours Reason for Outcome: Flank pain Flank pain or low back pain AND urinary tract symptoms Care Advice: ~ Another adult should drive. ~ Call provider if symptoms worsen or new symptoms develop. Increase intake of fluids. Try to drink 8 oz. (.2 liter) every hour when awake, including unsweetened cranberry juice, unless on restricted fluids for other medical reasons. Take sips of fluid or eat ice chips if nauseated or vomiting. ~ ~ SYMPTOM / CONDITION MANAGEMENT Limit carbonated, alcoholic, and caffeinated beverages such as coffee, tea and soda. Avoid nonprescription cold and allergy medications that contain caffeine. Limit intake of tomatoes, fruit juices (except for unsweetened cranberry juice), dairy products, spicy foods, sugar, and artificial sweeteners (aspartame or saccharine). Stop or decrease smoking. Reducing  exposure to bladder irritants may help lessen urgency. ~ Analgesic/Antipyretic Advice - Acetaminophen: Consider acetaminophen as directed on label or by pharmacist/provider for pain or fever PRECAUTIONS: - Use if there is no history of liver disease, alcoholism, or intake of three or more alcohol drinks per day - Only if approved by provider during pregnancy or when breastfeeding - During pregnancy, acetaminophen should not be taken more than 3 consecutive days without telling provider - Do not exceed recommended dose or frequency ~ Systemic Inflammatory Response Syndrome (SIRS): Watch for signs of a generalized, whole body infection. Occurs within days of a localized infection, especially of the urinary, GI, respiratory or nervous systems; or after a traumatic injury or invasive procedure. - Call EMS 911 if symptoms have worsened, such as increasing confusion or unusual drowsiness; cold and clammy skin; no urine output; rapid respiration (>30/min.) or slow respiration (<10/min.); struggling to breathe. - Go to the ED immediately for early symptoms of rapid pulse >90/min. or rapid breathing >20/min. at rest; chills; oral temperature >100.4 F (38 C) or <96.8 F (36 C) when associated with conditions noted. ~ 05/26/2011 10:00:25AM Page 1 of 1 [id number]

## 2011-05-26 NOTE — Telephone Encounter (Signed)
Patient on his way

## 2011-05-28 ENCOUNTER — Telehealth: Payer: Self-pay | Admitting: Family Medicine

## 2011-05-28 NOTE — Telephone Encounter (Signed)
Patient called the office asking to speak with Dr. Patsy Lager to inform him that he is doing a lot better from his last visit on 05/26/2011. He stated that it wasn't a kidney stone but probably just a bad infection. The best number to reach him is [phone].

## 2011-05-30 NOTE — Telephone Encounter (Signed)
Noted - i looked at his cx. He has an infection

## 2011-05-31 LAB — URINE CULTURE: Colony Count: >=100000

## 2011-05-31 NOTE — Telephone Encounter (Signed)
Patient advised.

## 2011-06-03 ENCOUNTER — Telehealth: Payer: Self-pay | Admitting: *Deleted

## 2011-06-03 MED ORDER — INSULIN NPH (HUMAN) (ISOPHANE) 100 UNIT/ML ~~LOC~~ SUSP: 40.0000 [IU] | Freq: Every day | SUBCUTANEOUS | Status: DC

## 2011-06-03 NOTE — Telephone Encounter (Signed)
Received fax from insurance company-Novolin N is no longer covered. Formulary alternatives are Humulin N and R.

## 2011-06-03 NOTE — Telephone Encounter (Signed)
Ok to change to humulin--same dosage

## 2011-06-03 NOTE — Telephone Encounter (Signed)
Rx sent to pharmacy for Humulin N.

## 2011-06-08 ENCOUNTER — Ambulatory Visit (INDEPENDENT_AMBULATORY_CARE_PROVIDER_SITE_OTHER): Payer: Medicare Other | Admitting: Endocrinology

## 2011-06-08 ENCOUNTER — Encounter: Payer: Self-pay | Admitting: Endocrinology

## 2011-06-08 DIAGNOSIS — E119 Type 2 diabetes mellitus without complications: Secondary | ICD-10-CM | POA: Diagnosis not present

## 2011-06-08 NOTE — Patient Instructions (Addendum)
Increase regular insulin to three times a day (just before each meal) 45-45-60 units.  however, take only 20 units if exertion is anticipated.  If you choose to eat at bedtime, take 15 units of regular insulin for it.   Reduce nph to 30 units at night.   Please make a follow-up appointment in 2 months.   check your blood sugar 2 times a day.  vary the time of day when you check, between before the 3 meals, and at bedtime.  also check if you have symptoms of your blood sugar being too high or too low.  please keep a record of the readings and bring it to your next appointment here.  please call us sooner if you are having low blood sugar episodes.   Let me know if you want to have an x-ray of your left foot, or to see a foot specialist.

## 2011-06-08 NOTE — Progress Notes (Signed)
Subjective:    Patient ID: [name], male    DOB: 12-16-1945, 66 y.o.   [name]: [id number]  HPI pt returns for f/u of insulin-requiring DM (1997).  He had several episodes of cbg of 80 (at 4 am), but this awakened him.  no cbg record, but states cbg's are highest before lunch (200's).   Pt states few mos of pain at the plantar aspect of both feet, but no assoc numbness.  sxs are worse with walking. It is worse on the left foot.   Past Medical History  Diagnosis Date  . Depression   . Diabetes mellitus   . Hyperlipidemia   . Hypertension   . Osteoarthritis   . Allergic rhinitis due to pollen   . GERD (gastroesophageal reflux disease)   . Gout   . Diverticulosis     Past Surgical History  Procedure Date  . Cholecystectomy 1980  . Total hip arthroplasty 2009    Dr. Darrelyn Hillock  . Total hip arthroplasty     History   Social History  . Marital Status: Married    Spouse Name: N/A    Number of Children: N/A  . 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 Smoking status: Former Smoker -- 3.0 packs/day for 35 years    Types: Cigarettes    Quit date: 04/12/2000  . Smokeless tobacco: Never Used  . Alcohol Use: No  . Drug Use: No  . Sexually Active: Not on file   Other Topics Concern  . Not on file   Social History Narrative   No regular exercise    Current Outpatient Prescriptions on File Prior to Visit  Medication Sig Dispense Refill  . aspirin 325 MG tablet Take 325 mg by mouth daily.        [name] Kitchen atenolol (TENORMIN) 100 MG tablet TAKE ONE TABLET BY MOUTH EVERY DAY  90 tablet  3  . b complex vitamins tablet Take 1 tablet by mouth daily.        . cholecalciferol (VITAMIN D) 1000 UNITS tablet Take 1,000 Units by mouth daily.        [name] Kitchen doxazosin (CARDURA) 8 MG tablet TAKE ONE TABLET BY MOUTH EVERY DAY AT BEDTIME  90 tablet  3  . famotidine (PEPCID) 10 MG tablet Take 10 mg by mouth daily.        .  fluticasone (FLONASE) 50 MCG/ACT nasal spray Place 2 sprays into the nose daily.  16 g  prn  . glucose blood test strip (Onetouch Ultra) test strips-check fasting blood sugar and after breakfast, lunch, and dinner  150 each  3  . indomethacin (INDOCIN) 25 MG capsule Take two tablets every 12 hours as needed for gout pain       . lisinopril-hydrochlorothiazide (PRINZIDE,ZESTORETIC) 20-12.5 MG per tablet TAKE ONE TABLET BY MOUTH EVERY DAY  90 tablet  3  . niacin (NIASPAN) 1000 MG CR tablet Take 1 tablet (1,000 mg total) by mouth at bedtime. Take an aspirin before taking the niaspan tablet  30 tablet  5  . venlafaxine (EFFEXOR XR) 75 MG 24 hr capsule Take 3 capsules (225 mg total) by mouth daily.  90 capsule  3  . ampicillin (PRINCIPEN) 500 MG capsule Take 500 mg by mouth 4 (four) times daily.        Allergies  Allergen Reactions  . Tetracycline     REACTION:  rash    Family History  Problem Relation Age of Onset  . Diabetes Mother   . Diabetes Father   . Lung cancer Sister     lung  . Heart disease Maternal Grandfather   . COPD Sister   . Heart attack Sister   . Heart disease Sister     BP 142/72  Pulse 98  Temp(Src) 96.9 F (36.1 C) (Oral)  Ht 6' (1.829 m)  Wt 255 lb 12.8 oz (116.03 kg)  BMI 34.69 kg/m2  SpO2 94%   Review of Systems Denies loc and weight change.      Objective:   Physical Exam VITAL SIGNS:  See vs page GENERAL: no distress.   Pulses: dorsalis pedis intact bilat.   Feet: no deformity.  no ulcer on the feet.  feet are of normal color and temp.  no edema.  There is bilateral onychomycosis. Neuro: sensation is intact to touch on the feet     Assessment & Plan:  DM, improved, but the pattern of his cbg's indicates he needs some adjustment in his therapy Foot pain.  Does not sound neuropathic

## 2011-06-23 DIAGNOSIS — E119 Type 2 diabetes mellitus without complications: Secondary | ICD-10-CM | POA: Diagnosis not present

## 2011-06-23 DIAGNOSIS — M722 Plantar fascial fibromatosis: Secondary | ICD-10-CM | POA: Diagnosis not present

## 2011-06-30 DIAGNOSIS — L708 Other acne: Secondary | ICD-10-CM | POA: Diagnosis not present

## 2011-07-01 DIAGNOSIS — M722 Plantar fascial fibromatosis: Secondary | ICD-10-CM | POA: Diagnosis not present

## 2011-07-08 ENCOUNTER — Telehealth: Payer: Self-pay | Admitting: Family Medicine

## 2011-07-08 NOTE — Telephone Encounter (Signed)
To: [facility] (Daytime Triage) Fax: [phone] From: [facility] Date/ Time: 07/08/2011 12:33 PM Taken By: Sundra Aland, RN Caller: Dennie Bible Facility: Not Collected Patient: Dustin, [name] DOB: 1945/11/29 Phone: [phone] Reason for Call: Caller was unable to be reached on callback - Busy Regarding Appointment: No Appt Date: Appt Time: Unknown Provider: Reason: Details: Outcome:

## 2011-07-22 DIAGNOSIS — Z0279 Encounter for issue of other medical certificate: Secondary | ICD-10-CM

## 2011-08-05 ENCOUNTER — Ambulatory Visit (INDEPENDENT_AMBULATORY_CARE_PROVIDER_SITE_OTHER): Payer: Medicare Other | Admitting: Endocrinology

## 2011-08-05 ENCOUNTER — Other Ambulatory Visit (INDEPENDENT_AMBULATORY_CARE_PROVIDER_SITE_OTHER): Payer: Medicare Other

## 2011-08-05 ENCOUNTER — Encounter: Payer: Self-pay | Admitting: Endocrinology

## 2011-08-05 VITALS — BP 124/72 | HR 85 | Temp 97.7°F | Ht 70.0 in | Wt 264.0 lb

## 2011-08-05 DIAGNOSIS — E1142 Type 2 diabetes mellitus with diabetic polyneuropathy: Secondary | ICD-10-CM | POA: Diagnosis not present

## 2011-08-05 DIAGNOSIS — E1149 Type 2 diabetes mellitus with other diabetic neurological complication: Secondary | ICD-10-CM

## 2011-08-05 DIAGNOSIS — E119 Type 2 diabetes mellitus without complications: Secondary | ICD-10-CM

## 2011-08-05 DIAGNOSIS — E1049 Type 1 diabetes mellitus with other diabetic neurological complication: Secondary | ICD-10-CM | POA: Diagnosis not present

## 2011-08-05 DIAGNOSIS — E1065 Type 1 diabetes mellitus with hyperglycemia: Secondary | ICD-10-CM | POA: Diagnosis not present

## 2011-08-05 LAB — HEMOGLOBIN A1C: Hgb A1c MFr Bld: 9.6 % — ABNORMAL HIGH (ref 4.6–6.5)

## 2011-08-05 NOTE — Patient Instructions (Addendum)
change regular insulin to three times a day (just before each meal) 50-40-60 units.  however, take only 20 units if exertion is anticipated.  If you choose to eat at bedtime, take 15 units of regular insulin for it.   continue nph to 30 units at night.   Please make a follow-up appointment in 3 months.  check your blood sugar 2 times a day.  vary the time of day when you check, between before the 3 meals, and at bedtime.  also check if you have symptoms of your blood sugar being too high or too low.  please keep a record of the readings and bring it to your next appointment here.  please call us sooner if you are having low blood sugar episodes.

## 2011-08-05 NOTE — Progress Notes (Signed)
Subjective:    Patient ID: [name], male    DOB: Jun 01, 1945, 66 y.o.   [name]: [id number]  HPI pt returns for f/u of insulin-requiring DM (dx'ed 1997, complicated by peripheral sensory neuropathy).  no cbg record, but states cbg's are highest before lunch (200's), and lowest in the afternoon.   Past Medical History  Diagnosis Date  . Depression   . Diabetes mellitus   . Hyperlipidemia   . Hypertension   . Osteoarthritis   . Allergic rhinitis due to pollen   . GERD (gastroesophageal reflux disease)   . Gout   . Diverticulosis     Past Surgical History  Procedure Date  . Cholecystectomy 1980  . Total hip arthroplasty 2009    Dr. Darrelyn Hillock  . Total hip arthroplasty     History   Social History  . Marital Status: Married    Spouse Name: N/A    Number of Children: N/A  . 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 Smoking status: Former Smoker -- 3.0 packs/day for 35 years    Types: Cigarettes    Quit date: 04/12/2000  . Smokeless tobacco: Never Used  . Alcohol Use: No  . Drug Use: No  . Sexually Active: Not on file   Other Topics Concern  . Not on file   Social History Narrative   No regular exercise    Current Outpatient Prescriptions on File Prior to Visit  Medication Sig Dispense Refill  . ampicillin (PRINCIPEN) 500 MG capsule Take 500 mg by mouth 4 (four) times daily.      [name] Kitchen aspirin 325 MG tablet Take 325 mg by mouth daily.        [name] Kitchen atenolol (TENORMIN) 100 MG tablet TAKE ONE TABLET BY MOUTH EVERY DAY  90 tablet  3  . b complex vitamins tablet Take 1 tablet by mouth daily.        . cholecalciferol (VITAMIN D) 1000 UNITS tablet Take 1,000 Units by mouth daily.        [name] Kitchen doxazosin (CARDURA) 8 MG tablet TAKE ONE TABLET BY MOUTH EVERY DAY AT BEDTIME  90 tablet  3  . famotidine (PEPCID) 10 MG tablet Take 10 mg by mouth daily.        . fluticasone (FLONASE) 50 MCG/ACT nasal spray  Place 2 sprays into the nose daily.  16 g  prn  . glucose blood test strip (Onetouch Ultra) test strips-check fasting blood sugar and after breakfast, lunch, and dinner  150 each  3  . indomethacin (INDOCIN) 25 MG capsule Take two tablets every 12 hours as needed for gout pain       . insulin NPH (HUMULIN N,NOVOLIN N) 100 UNIT/ML injection Inject 30 Units into the skin at bedtime.      . insulin regular (NOVOLIN R,HUMULIN R) 100 units/mL injection 3x a day (just before each meal) 50-40-60 units (and 100-unit syringes 4/day)      . lisinopril-hydrochlorothiazide (PRINZIDE,ZESTORETIC) 20-12.5 MG per tablet TAKE ONE TABLET BY MOUTH EVERY DAY  90 tablet  3  . niacin (NIASPAN) 1000 MG CR tablet Take 1 tablet (1,000 mg total) by mouth at bedtime. Take an aspirin before taking the niaspan tablet  30 tablet  5  . venlafaxine (EFFEXOR XR) 75 MG 24 hr capsule Take 3 capsules (225 mg total) by mouth daily.  90 capsule  3  Allergies  Allergen Reactions  . Tetracycline     REACTION: rash    Family History  Problem Relation Age of Onset  . Diabetes Mother   . Diabetes Father   . Lung cancer Sister     lung  . Heart disease Maternal Grandfather   . COPD Sister   . Heart attack Sister   . Heart disease Sister     BP 124/72  Pulse 85  Temp(Src) 97.7 F (36.5 C) (Oral)  Ht 5\' 10"  (1.778 m)  Wt 264 lb (119.75 kg)  BMI 37.88 kg/m2  SpO2 95%   Review of Systems denies hypoglycemia    Objective:   Physical Exam VITAL SIGNS:  See vs page GENERAL: no distress LUNGS:  Clear to auscultation HEART:  Regular rate and rhythm without murmurs noted. Normal S1,S2.    Lab Results  Component Value Date   HGBA1C 9.6* 08/05/2011      Assessment & Plan:  DM, needs increased rx

## 2011-08-06 ENCOUNTER — Telehealth: Payer: Self-pay | Admitting: *Deleted

## 2011-08-06 NOTE — Telephone Encounter (Signed)
Called pt to inform of lab results, pt informed (letter also mailed to pt). 

## 2011-08-20 ENCOUNTER — Telehealth: Payer: Self-pay | Admitting: *Deleted

## 2011-08-20 MED ORDER — SIMVASTATIN 40 MG PO TABS: 40.0000 mg | ORAL_TABLET | Freq: Every day | ORAL | Status: DC

## 2011-08-20 NOTE — Telephone Encounter (Signed)
Patient requesting simvastatin 40 mg but, was discontinued  07-2010 but, per pharmacy patient has still been taken this medication okay to refill

## 2011-08-20 NOTE — Telephone Encounter (Signed)
Yes  zocor 40 mg, 1 po daily  Electronically prescribe to the pharmacy of their choice. (May call in if pharmacy does not participate in electronic prescriptions) Call in #30, 11 refills. OR if they prefer a 90 day supply, #90 with 3 refills is OK, too Prescription instructions above

## 2011-08-26 ENCOUNTER — Ambulatory Visit (INDEPENDENT_AMBULATORY_CARE_PROVIDER_SITE_OTHER): Payer: Medicare Other | Admitting: Family Medicine

## 2011-08-26 ENCOUNTER — Encounter: Payer: Self-pay | Admitting: Family Medicine

## 2011-08-26 VITALS — BP 110/70 | HR 80 | Temp 97.6°F | Ht 70.0 in | Wt 255.0 lb

## 2011-08-26 DIAGNOSIS — K644 Residual hemorrhoidal skin tags: Secondary | ICD-10-CM

## 2011-08-26 DIAGNOSIS — R5383 Other fatigue: Secondary | ICD-10-CM | POA: Diagnosis not present

## 2011-08-26 DIAGNOSIS — N529 Male erectile dysfunction, unspecified: Secondary | ICD-10-CM | POA: Diagnosis not present

## 2011-08-26 DIAGNOSIS — R5381 Other malaise: Secondary | ICD-10-CM | POA: Diagnosis not present

## 2011-08-26 LAB — TSH: TSH: 0.55 u[IU]/mL (ref 0.35–5.50)

## 2011-08-26 LAB — CBC WITH DIFFERENTIAL/PLATELET
Basophils Absolute: 0 10*3/uL (ref 0.0–0.1)
Basophils Relative: 0.3 % (ref 0.0–3.0)
HCT: 34.1 % — ABNORMAL LOW (ref 39.0–52.0)
Hemoglobin: 11.5 g/dL — ABNORMAL LOW (ref 13.0–17.0)
Lymphs Abs: 1.4 10*3/uL (ref 0.7–4.0)
Monocytes Relative: 13 % — ABNORMAL HIGH (ref 3.0–12.0)
Neutro Abs: 3.2 10*3/uL (ref 1.4–7.7)
RBC: 3.8 Mil/uL — ABNORMAL LOW (ref 4.22–5.81)
RDW: 13.8 % (ref 11.5–14.6)

## 2011-08-26 NOTE — Patient Instructions (Signed)
REFERRAL: GO THE THE FRONT ROOM AT THE ENTRANCE OF OUR CLINIC, NEAR CHECK IN. ASK FOR MARION. SHE WILL HELP YOU SET UP YOUR REFERRAL. DATE: TIME:  

## 2011-08-26 NOTE — Progress Notes (Signed)
  Patient Name: [name] Date of Birth: [birth year] Age: 66 y.o. Medical Record Number: [id number] Gender: male Date of Encounter: 08/26/2011  History of Present Illness:  [name] is a 66 y.o. very pleasant male patient who presents with the following:  Wants to sleep all the time Will nod off and sleep until wife. Using CPAP machine.  Does not snore with CPAP, will snore when not on. Wearing it every night. BS elevated some, but not severely high, 140 to rarely in the 200's  Has had some bleeding from hemorrhoids, routinely and regularly Interested in banding procedure   Past Medical History, Surgical History, Social History, Family History, Problem List, Medications, and Allergies have been reviewed and updated if relevant.  Review of Systems:  GEN: No acute illnesses, no fevers, chills. GI: No n/v/d, eating normally Pulm: No SOB Interactive and getting along well at home.  Otherwise, ROS is as per the HPI.   Physical Examination: Filed Vitals:   08/26/11 1125  BP: 110/70  Pulse: 80  Temp: 97.6 F (36.4 C)  TempSrc: Oral  Height: 5\' 10"  (1.778 m)  Weight: 255 lb (115.667 kg)  SpO2: 98%    Body mass index is 36.59 kg/(m^2).   GEN: WDWN, NAD, Non-toxic, A & O x 3 HEENT: Atraumatic, Normocephalic. Neck supple. No masses, No LAD. Ears and Nose: No external deformity. CV: RRR, No M/G/R. No JVD. No thrill. No extra heart sounds. PULM: CTA B, no wheezes, crackles, rhonchi. No retractions. No resp. distress. No accessory muscle use. Rectal: large external hemorrhoids, no thrombosis EXTR: No c/c/e NEURO Normal gait.  PSYCH: Normally interactive. Conversant. Not depressed or anxious appearing.  Calm demeanor.    Assessment and Plan:  1. Fatigue  Testosterone, free, total, TSH, CBC with Differential  2. ED (erectile dysfunction)  Testosterone, free, total  3. Hemorrhoids, external  Ambulatory referral to Gastroenterology   Above Discussed with him  multifactorial nature of fatigue, advised exercise, reviewed recent labs, and will check above  Consult gi as well for potential hemorrhoid banding  Orders Today: Orders Placed This Encounter  Procedures  . Testosterone, free, total  . TSH  . CBC with Differential  . Ambulatory referral to Gastroenterology    Referral Priority:  Routine    Referral Type:  Consultation    Referral Reason:  Specialty Services Required    Requested Specialty:  Gastroenterology    Number of Visits Requested:  1    Medications Today: No orders of the defined types were placed in this encounter.

## 2011-08-27 LAB — TESTOSTERONE, FREE, TOTAL, SHBG: Testosterone: 128.67 ng/dL — ABNORMAL LOW (ref 300–890)

## 2011-08-31 ENCOUNTER — Other Ambulatory Visit: Payer: Self-pay | Admitting: Family Medicine

## 2011-08-31 DIAGNOSIS — D539 Nutritional anemia, unspecified: Secondary | ICD-10-CM

## 2011-08-31 DIAGNOSIS — D649 Anemia, unspecified: Secondary | ICD-10-CM

## 2011-08-31 DIAGNOSIS — R899 Unspecified abnormal finding in specimens from other organs, systems and tissues: Secondary | ICD-10-CM

## 2011-08-31 DIAGNOSIS — E291 Testicular hypofunction: Secondary | ICD-10-CM

## 2011-08-31 DIAGNOSIS — D473 Essential (hemorrhagic) thrombocythemia: Secondary | ICD-10-CM

## 2011-09-01 ENCOUNTER — Other Ambulatory Visit (INDEPENDENT_AMBULATORY_CARE_PROVIDER_SITE_OTHER): Payer: Medicare Other

## 2011-09-01 DIAGNOSIS — E291 Testicular hypofunction: Secondary | ICD-10-CM

## 2011-09-01 DIAGNOSIS — D539 Nutritional anemia, unspecified: Secondary | ICD-10-CM | POA: Diagnosis not present

## 2011-09-01 DIAGNOSIS — D473 Essential (hemorrhagic) thrombocythemia: Secondary | ICD-10-CM | POA: Diagnosis not present

## 2011-09-01 LAB — FERRITIN: Ferritin: 30.1 ng/mL (ref 22.0–322.0)

## 2011-09-02 LAB — TESTOSTERONE, FREE, TOTAL, SHBG: Testosterone-% Free: 3.2 % — ABNORMAL HIGH (ref 1.6–2.9)

## 2011-09-02 LAB — IBC PANEL
Iron: 57 ug/dL (ref 42–165)
Saturation Ratios: 16.9 % — ABNORMAL LOW (ref 20.0–50.0)
Transferrin: 241.3 mg/dL (ref 212.0–360.0)

## 2011-09-07 ENCOUNTER — Ambulatory Visit: Payer: Medicare Other | Admitting: Pulmonary Disease

## 2011-09-14 ENCOUNTER — Ambulatory Visit (INDEPENDENT_AMBULATORY_CARE_PROVIDER_SITE_OTHER): Payer: Medicare Other | Admitting: Surgery

## 2011-09-20 ENCOUNTER — Encounter: Payer: Self-pay | Admitting: Family Medicine

## 2011-09-20 ENCOUNTER — Ambulatory Visit (INDEPENDENT_AMBULATORY_CARE_PROVIDER_SITE_OTHER): Payer: Medicare Other | Admitting: Family Medicine

## 2011-09-20 VITALS — BP 126/66 | HR 88 | Temp 98.3°F | Wt 254.0 lb

## 2011-09-20 DIAGNOSIS — R05 Cough: Secondary | ICD-10-CM

## 2011-09-20 MED ORDER — AZITHROMYCIN 250 MG PO TABS: ORAL_TABLET | ORAL | Status: AC

## 2011-09-20 MED ORDER — ALBUTEROL SULFATE HFA 108 (90 BASE) MCG/ACT IN AERS: 2.0000 | INHALATION_SPRAY | Freq: Four times a day (QID) | RESPIRATORY_TRACT | Status: DC | PRN

## 2011-09-20 NOTE — Progress Notes (Signed)
5 days of cough.  +sputum.  Had a fever.  Some wheeze, more than normal.  Sugar has been high at baseline with A1c >9.  No ear pain. Mild ST.  No vomiting, diarrhea.  No rash.    Wanted to consider testosterone replacement and I sent a notice to PCP.    Meds, vitals, and allergies reviewed.   ROS: See HPI.  Otherwise, noncontributory.  nad obese ncat Tm wnl Mmm No inc in wob but BS are coarser on the R side. abd obese Ext w/o cyanosis

## 2011-09-20 NOTE — Patient Instructions (Signed)
Start the antibiotics today and use the inhaler if needed.  Take care.

## 2011-09-20 NOTE — Assessment & Plan Note (Signed)
Presumed bronchitis in obese uncontrolled DM1 patient.  Would presume bacterial source and use zmax and saba.  He agrees.  Nontoxic.  He needs to lose weight and work on sugar control.

## 2011-09-21 ENCOUNTER — Telehealth: Payer: Self-pay | Admitting: Family Medicine

## 2011-09-21 MED ORDER — TESTOSTERONE 12.5 MG/ACT (1%) TD GEL: 4.0000 | Freq: Every day | TRANSDERMAL | Status: DC

## 2011-09-21 NOTE — Telephone Encounter (Signed)
Message copied by Hannah Beat on Tue Sep 21, 2011  9:06 AM ------      Message from: Joaquim Nam      Created: Mon Sep 20, 2011  4:35 PM       Patient wanted to start on testosterone.  Please contact him about this.

## 2011-09-21 NOTE — Telephone Encounter (Signed)
Ok, we already discussed fairly extensively.  Start Androgel 1% pump. 4 pumps daily to clean, dry shoulders. Disp: 2 pumps. 3 refills  Recheck AM testosterone level in 1 month: re: hypogonadism

## 2011-09-21 NOTE — Telephone Encounter (Signed)
rx called to pharmacy, patient advised and lab appt made

## 2011-09-27 ENCOUNTER — Encounter (INDEPENDENT_AMBULATORY_CARE_PROVIDER_SITE_OTHER): Payer: Self-pay | Admitting: Surgery

## 2011-09-27 ENCOUNTER — Ambulatory Visit (INDEPENDENT_AMBULATORY_CARE_PROVIDER_SITE_OTHER): Payer: Medicare Other | Admitting: Surgery

## 2011-09-27 VITALS — BP 126/64 | HR 99 | Ht 72.0 in | Wt 252.4 lb

## 2011-09-27 DIAGNOSIS — K648 Other hemorrhoids: Secondary | ICD-10-CM | POA: Insufficient documentation

## 2011-09-27 DIAGNOSIS — K649 Unspecified hemorrhoids: Secondary | ICD-10-CM

## 2011-09-27 DIAGNOSIS — E669 Obesity, unspecified: Secondary | ICD-10-CM

## 2011-09-27 NOTE — Patient Instructions (Addendum)
HEMORRHOIDS   The rectum is the last few inches of your colon, and it naturally stretches to hold stool.  Hemorrhoidal piles are natural clusters of blood vessels that help the rectum stretch to hold stool and allow bowel movements to eliminate feces.  Hemorrhoids are abnormally swollen blood vessels in the rectum.  Too much pressure in the rectum causes hemorrhoids by forcing blood to stretch and bulge the walls of the veins, sometimes even rupturing them.  Hemorrhoids can become like varicose veins you might see on a person's legs. When bulging hemorrhoidal veins are irritated, they can swell, burn, itch, become very painful, and bleed. Once the rectal veins have been stretched out and hemorrhoids created, they are difficult to get rid of completely and tend to recur with less straining than it took to cause them in the first place. Fortunately, good habits and simple medical treatment usually control hemorrhoids well, and surgery is only recommended in unusually severe cases. Some of the most frequent causes of hemorrhoids:    Constant sitting    Straining with bowel movements (from constipation or hard stools)    Diarrhea    Sitting on the toilet for a long time    Severe coughing    Childbirth    Heavy Lifting  Types of Hemorrhoids:    Internal hemorrhoids usually don't hurt or itch; they are deep inside the rectum and usually have no sensation. However, internal hemorrhoids can bleed.  Such bleeding should not be ignored and mask blood from a dangerous source like colorectal cancer, so persistent rectal bleeding should be investigated with a colonoscopy.    External hemorrhoids cause most of the symptoms - pain, burning, and itching. Unirritated hemorrhoids can look like small skin tags coming out of the anus.     Thrombosed hemorrhoids can form when a hemorrhoid blood vessel bursts and causes the hemorrhoid to swell.  A purple blood clot can form in it and become an excruciatingly painful lump  at the anus. Because of these unpleasant symptoms, immediate incision and drainage by a surgeon at an office visit can provide much relief of the pain.    PREVENTION Avoiding the causes listed in above will prevent most cases of hemorrhoids, but this advice is sometimes hard to follow:  How can you avoid sitting all day if you have a seated job? Also, we try to avoid coughing and diarrhea, but sometimes it's beyond your control.  Still, there are some practical hints to help:    If your main job activity is seated, always stand or walk during your breaks. Make it a point to stand and walk at least 5 minutes every hour and try to shift frequently in your chair to avoid direct rectal pressure.    Always exhale as you strain or lift. Don't hold your breath.    Treat coughing, diarrhea and constipation early since irritated hemorrhoids may soon follow.    Do not delay or try to prevent a bowel movement when the urge is present.   Exercise regularly (walking or jogging 60 minutes a day) to stimulate the bowels to move.   Avoid dry toilet paper when cleaning after bowel movements.  Moistened tissues such as baby wipes are less irritating.  Lightly pat the rectal area dry.  Using irrigating showers or bottle irrigation washing can more gently clean this sensitive area.   Keep the anal and genital area clean and  dry.  Talcum or baby powders can help   GET   YOUR STOOLS SOFT.   This is the most important way to prevent irritated hemorrhoids.  Hard stools are like sandpaper to the anorectal canal and will cause more problems.   The goal: ONE SOFT BOWEL MOVEMENT A DAY!  To have soft, regular bowel movements:    Drink at least 8 tall glasses of water a day.     AVOID CONSTIPATION    Take plenty of fiber.  Fiber is the undigested part of plant food that passes into the colon, acting s "natures broom" to encourage bowel motility and movement.  Fiber can absorb and hold large amounts of water. This results in a  larger, bulkier stool, which is soft and easier to pass. Work gradually over several weeks up to 6 servings a day of fiber (25g a day even more if needed) in the form of: o Vegetables -- Root (potatoes, carrots, turnips), leafy green (lettuce, salad greens, celery, spinach), or cooked high residue (cabbage, broccoli, etc) o Fruit -- Fresh (unpeeled skin & pulp), Dried (prunes, apricots, cherries, etc ),  or stewed ( applesauce)  o Whole grain breads, pasta, etc (whole wheat)  o Bran cereals    Bulking Agents -- This type of water-retaining fiber generally is easily obtained each day by one of the following:  o Psyllium bran -- The psyllium plant is remarkable because its ground seeds can retain so much water. This product is available as Metamucil, Konsyl, Effersyllium, Per Diem Fiber, or the less expensive generic preparation in drug and health food stores. Although labeled a laxative, it really is not a laxative.  o Methylcellulose -- This is another fiber derived from wood which also retains water. It is available as Citrucel. o Polyethylene Glycol - and "artificial" fiber commonly called Miralax or Glycolax.  It is helpful for people with gassy or bloated feelings with regular fiber o Flax Seed - a less gassy fiber than psyllium   No reading or other relaxing activity while on the toilet. If bowel movements take longer than 5 minutes, you are too constipated   Laxatives can be useful for a short period if constipation is severe o Osmotics (Milk of Magnesia, Fleets phosphosoda, Magnesium citrate, MiraLax, GoLytely) are safer than  o Stimulants (Senokot, Castor Oil, Dulcolax, Ex Lax)    o Do not take laxatives for more than 7days in a row.   Laxatives are not a good long-term solution as it can stress the intestine and colon and causes too much mineral and fluid losses.    If badly constipated, try a Bowel Retraining Program: o Do not use laxatives.  o Eat a diet high in roughage, such as bran  cereals and leafy vegetables.  o Drink six (6) ounces of prune or apricot juice each morning.  o Eat two (2) large servings of stewed fruit each day.  o Take one (1) heaping dose of a bulking agent (ex. Metamucil, Citrucel, Miralax) twice a day.  o Use sugar-free sweetener when possible to avoid excessive calories.  o Eat a normal breakfast.  o Set aside 15 minutes after breakfast to sit on the toilet, but do not strain to have a bowel movement.  o If you do not have a bowel movement by the third day, use an enema and repeat the above steps.    AVOID DIARRHEA o Switch to liquids and simpler foods for a few days to avoid stressing your intestines further. o Avoid dairy products (especially milk & ice cream) for a short   time.  The intestines often can lose the ability to digest lactose when stressed. o Avoid foods that cause gassiness or bloating.  Typical foods include beans and other legumes, cabbage, broccoli, and dairy foods.  Every person has some sensitivity to other foods, so listen to our body and avoid those foods that trigger problems for you. o Adding fiber (Citrucel, Metamucil, psyllium, Miralax) gradually can help thicken stools by absorbing excess fluid and retrain the intestines to act more normally.  Slowly increase the dose over a few weeks.  Too much fiber too soon can backfire and cause cramping & bloating. o Probiotics (such as active yogurt, Align, etc) may help repopulate the intestines and colon with normal bacteria and calm down a sensitive digestive tract.  Most studies show it to be of mild help, though, and such products can be costly. o Medicines:   Bismuth subsalicylate (ex. Kayopectate, Pepto Bismol) every 30 minutes for up to 6 doses can help control diarrhea.  Avoid if pregnant.   Loperamide (Immodium) can slow down diarrhea.  Start with two tablets (4mg  total) first and then try one tablet every 6 hours.  Avoid if you are having fevers or severe pain.  If you are not  better or start feeling worse, stop all medicines and call your doctor for advice o Call your doctor if you are getting worse or not better.  Sometimes further testing (cultures, endoscopy, X-ray studies, bloodwork, etc) may be needed to help diagnose and treat the cause of the diarrhea.   If these preventive measures fail, you must take action right away! Hemorrhoids are one condition that can be mild in the morning and become intolerable by nightfall.       ANORECTAL SURGERY: POST OP INSTRUCTIONS  1. Take your usually prescribed home medications unless otherwise directed. 2. DIET: Follow a light bland diet the first 24 hours after arrival home, such as soup, liquids, crackers, etc.  Be sure to include lots of fluids daily.  Avoid fast food or heavy meals as your are more likely to get nauseated.  Eat a low fat the next few days after surgery.   3. PAIN CONTROL: a. Pain is best controlled by a usual combination of three different methods TOGETHER: i. Ice/Heat ii. Over the counter pain medication iii. Prescription pain medication b. Most patients will experience some swelling and discomfort in the anus/rectal area. and incisions.  Ice packs or heat (30-60 minutes up to 6 times a day) will help. Use ice for the first few days to help decrease swelling and bruising, then switch to heat such as warm towels, sitz baths, warm baths, etc to help relax tight/sore spots and speed recovery.  Some people prefer to use ice alone, heat alone, alternating between ice & heat.  Experiment to what works for you.  Swelling and bruising can take several weeks to resolve.   c. It is helpful to take an over-the-counter pain medication regularly for the first few weeks.  Choose one of the following that works best for you: i. Naproxen (Aleve, etc)  Two 220mg  tabs twice a day ii. Ibuprofen (Advil, etc) Three 200mg  tabs four times a day (every meal & bedtime) iii. Acetaminophen (Tylenol, etc) 500-650mg  four times a  day (every meal & bedtime) d. A  prescription for pain medication (such as oxycodone, hydrocodone, etc) should be given to you upon discharge.  Take your pain medication as prescribed.  i. If you are having problems/concerns with the prescription medicine (  does not control pain, nausea, vomiting, rash, itching, etc), please call us [phone] to see if we need to switch you to a different pain medicine that will work better for you and/or control your side effect better. ii. If you need a refill on your pain medication, please contact your pharmacy.  They will contact our office to request authorization. Prescriptions will not be filled after 5 pm or on week-ends. 4. KEEP YOUR BOWELS REGULAR a. The goal is one bowel movement a day b. Avoid getting constipated.  Between the surgery and the pain medications, it is common to experience some constipation.  Increasing fluid intake and taking a fiber supplement (such as Metamucil, Citrucel, FiberCon, MiraLax, etc) 1-2 times a day regularly will usually help prevent this problem from occurring.  A mild laxative (prune juice, Milk of Magnesia, MiraLax, etc) should be taken according to package directions if there are no bowel movements after 48 hours. c. Watch out for diarrhea.  If you have many loose bowel movements, simplify your diet to bland foods & liquids for a few days.  Stop any stool softeners and decrease your fiber supplement.  Switching to mild anti-diarrheal medications (Kayopectate, Pepto Bismol) can help.  If this worsens or does not improve, please call us.  5. Wound Care a. Remove your bandages the day after surgery.  Unless discharge instructions indicate otherwise, leave your bandage dry and in place overnight.  Remove the bandage during your first bowel movement.   b. Allow the wound packing to fall out over the next few days.  You can trim exposed gauze / ribbon as it falls out.  You do not need to repack the wound unless instructed  otherwise.  Wear an absorbent pad or soft cotton gauze in your underwear as needed to catch any drainage and help keep the area  c. Keep the area clean and dry.  Bathe / shower every day.  Keep the area clean by showering / bathing over the incision / wound.   It is okay to soak an open wound to help wash it.  Wet wipes or showers / gentle washing after bowel movements is often less traumatic than regular toilet paper. d. Bonita Quin may have some styrofoam-like soft packing in the rectum which will come out with the first bowel movement.  e. You will often notice bleeding with bowel movements.  This should slow down by the end of the first week of surgery f. Expect some drainage.  This should slow down, too, by the end of the first week of surgery.  Wear an absorbent pad or soft cotton gauze in your underwear until the drainage stops. 6. ACTIVITIES as tolerated:   a. You may resume regular (light) daily activities beginning the next day--such as daily self-care, walking, climbing stairs--gradually increasing activities as tolerated.  If you can walk 30 minutes without difficulty, it is safe to try more intense activity such as jogging, treadmill, bicycling, low-impact aerobics, swimming, etc. b. Save the most intensive and strenuous activity for last such as sit-ups, heavy lifting, contact sports, etc  Refrain from any heavy lifting or straining until you are off narcotics for pain control.   c. DO NOT PUSH THROUGH PAIN.  Let pain be your guide: If it hurts to do something, don't do it.  Pain is your body warning you to avoid that activity for another week until the pain goes down. d. You may drive when you are no longer taking prescription pain medication,  you can comfortably sit for long periods of time, and you can safely maneuver your car and apply brakes. e. Bonita Quin may have sexual intercourse when it is comfortable.  7. FOLLOW UP in our office a. Please call [facility] at [phone] to set up an appointment to  see your surgeon in the office for a follow-up appointment approximately 2 weeks after your surgery. b. Make sure that you call for this appointment the day you arrive home to insure a convenient appointment time. 10. IF YOU HAVE DISABILITY OR FAMILY LEAVE FORMS, BRING THEM TO THE OFFICE FOR PROCESSING.  DO NOT GIVE THEM TO YOUR DOCTOR.        WHEN TO CALL us [phone]: 1. Poor pain control 2. Reactions / problems with new medications (rash/itching, nausea, etc)  3. Fever over 101.5 F (38.5 C) 4. Inability to urinate 5. Nausea and/or vomiting 6. Worsening swelling or bruising 7. Continued bleeding from incision. 8. Increased pain, redness, or drainage from the incision  The clinic staff is available to answer your questions during regular business hours (8:30am-5pm).  Please don't hesitate to call and ask to speak to one of our nurses for clinical concerns.   A surgeon from [facility] is always on call at the hospitals   If you have a medical emergency, go to the nearest emergency room or call 911.    [facility] [address], [address], [place], [place]  [address] ? MAIN: [phone] ? TOLL FREE: [phone] ? FAX [phone] [email or web address]

## 2011-09-27 NOTE — Progress Notes (Signed)
Subjective:     Patient ID: [name], male   DOB: 1945/11/10, 66 y.o.   [name]: [id number]  HPI  [name]  12-13-1945 [id number]  Patient Care Team: Hannah Beat, MD as PCP - General Iva Boop, MD as Consulting Physician (Gastroenterology)  This patient is a 66 y.o.male who presents today for surgical evaluation at the request of Dr. Dallas Schimke.   Reason for visit: Bleeding hemorrhoids.  Patient is a pleasant obese male who has been struggling with bleeding and hemorrhoid problems for the past year. He had a colonoscopy last year that did not show any major abnormalities by Dr. Leone Payor. He notes he gets blood red blood per rectum. No prolapsing that he can recall.  Some itching but no major pain or burning. He is used over-the-counter hemorrhoidal creams. They've helped a little bit. Because of the persistent bleeding, he was sent to me for surgical evaluation in the hopes of a banding her of the procedure.  Patient can walk 20 minutes without much difficulty. He did have bilateral hip replacements which have slowed down a bit. Claims he has 1-2 soft bowel movements a day. Occasional fecal urgency and "diarrhea".  Sounds more like the bowel movements are occasionally loose but not high volume watery stools.  Patient Active Problem List  Diagnosis  . HYPERLIPIDEMIA  . GOUT  . DEPRESSION  . HYPERTENSION  . ALLERGIC RHINITIS  . GERD  . OSTEOARTHRITIS  . HIP REPLACEMENT, RIGHT, HX OF  . History of smoking  . Family history of MI (myocardial infarction)  . Diabetic neuropathy  . OSA (obstructive sleep apnea)  . Type I (juvenile type) diabetes mellitus with neurological manifestations, uncontrolled  . Cough  . Hemorrhoids with bleeding (L lateral pile especially)  . Obesity (BMI 30-39.9)    Past Medical History  Diagnosis Date  . Depression   . Diabetes mellitus   . Hyperlipidemia   . Hypertension   . Osteoarthritis   . Allergic rhinitis due to pollen   . GERD  (gastroesophageal reflux disease)   . Gout   . Diverticulosis   . Personal history of colonic polyps     1996    Past Surgical History  Procedure Date  . Cholecystectomy 1980  . Total hip arthroplasty 2009    Dr. Darrelyn Hillock  . Total hip arthroplasty     History   Social History  . Marital Status: Married    Spouse Name: N/A    Number of Children: N/A  . 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 Smoking status: Former Smoker -- 3.0 packs/day for 35 years    Types: Cigarettes    Quit date: 04/12/2000  . Smokeless tobacco: Never Used  . Alcohol Use: No  . Drug Use: No  . Sexually Active: Not on file   Other Topics Concern  . Not on file   Social History Narrative   No regular exercise    Family History  Problem Relation Age of Onset  . Diabetes Mother   . Diabetes Father   . Lung cancer Sister     lung  . Heart disease Maternal Grandfather   . COPD Sister   . Heart attack Sister   . Heart disease Sister     Current Outpatient Prescriptions  Medication Sig Dispense Refill  . albuterol (PROVENTIL HFA;VENTOLIN HFA) 108 (90 BASE) MCG/ACT inhaler Inhale  2 puffs into the lungs every 6 (six) hours as needed for wheezing.  1 Inhaler  0  . aspirin 325 MG tablet Take 325 mg by mouth daily.        [name] Kitchen atenolol (TENORMIN) 100 MG tablet TAKE ONE TABLET BY MOUTH EVERY DAY  90 tablet  3  . b complex vitamins tablet Take 1 tablet by mouth daily.        . cholecalciferol (VITAMIN D) 1000 UNITS tablet Take 1,000 Units by mouth daily.        [name] Kitchen doxazosin (CARDURA) 8 MG tablet TAKE ONE TABLET BY MOUTH EVERY DAY AT BEDTIME  90 tablet  3  . famotidine (PEPCID) 10 MG tablet Take 10 mg by mouth daily.        . ferrous sulfate 325 (65 FE) MG tablet Take 325 mg by mouth daily with breakfast.      . fluticasone (FLONASE) 50 MCG/ACT nasal spray Place 2 sprays into the nose daily as needed.      [name] Kitchen glucose blood  test strip (Onetouch Ultra) test strips-check fasting blood sugar and after breakfast, lunch, and dinner  150 each  3  . indomethacin (INDOCIN) 25 MG capsule Take two tablets every 12 hours as needed for gout pain       . insulin NPH (HUMULIN N,NOVOLIN N) 100 UNIT/ML injection Inject 30 Units into the skin at bedtime.      . insulin regular (NOVOLIN R,HUMULIN R) 100 units/mL injection 3x a day (just before each meal) 50-40-60 units (and 100-unit syringes 4/day)      . lisinopril-hydrochlorothiazide (PRINZIDE,ZESTORETIC) 20-12.5 MG per tablet TAKE ONE TABLET BY MOUTH EVERY DAY  90 tablet  3  . niacin (NIASPAN) 1000 MG CR tablet Take 1 tablet (1,000 mg total) by mouth at bedtime. Take an aspirin before taking the niaspan tablet  30 tablet  5  . simvastatin (ZOCOR) 40 MG tablet Take 1 tablet (40 mg total) by mouth at bedtime.  30 tablet  11  . Testosterone (ANDROGEL PUMP) 1.25 GM/ACT (1%) GEL Place 4 Squirts onto the skin daily. Clean and dry shoulders  2 Bottle  3  . venlafaxine (EFFEXOR XR) 75 MG 24 hr capsule Take 3 capsules (225 mg total) by mouth daily.  90 capsule  3     Allergies  Allergen Reactions  . Tetracycline     REACTION: rash    BP 126/64  Pulse 99  Ht 6' (1.829 m)  Wt 252 lb 6.4 oz (114.488 kg)  BMI 34.23 kg/m2  SpO2 93%  No results found.   Review of Systems  Constitutional: Negative for fever, chills and diaphoresis.  HENT: Negative for nosebleeds, sore throat, facial swelling, mouth sores, trouble swallowing and ear discharge.   Eyes: Negative for photophobia, discharge and visual disturbance.  Respiratory: Negative for choking, chest tightness, shortness of breath and stridor.   Cardiovascular: Negative for chest pain and palpitations.  Gastrointestinal: Negative for nausea, vomiting, abdominal pain, diarrhea, constipation, blood in stool, abdominal distention, anal bleeding and rectal pain.  Genitourinary: Negative for dysuria, urgency, difficulty urinating and  testicular pain.  Musculoskeletal: Negative for myalgias, back pain, arthralgias and gait problem.  Skin: Negative for color change, pallor, rash and wound.  Neurological: Negative for dizziness, speech difficulty, weakness, numbness and headaches.  Hematological: Negative for adenopathy. Does not bruise/bleed easily.  Psychiatric/Behavioral: Negative for hallucinations, confusion and agitation.       Objective:   Physical Exam  Constitutional: He is oriented  to person, place, and time. He appears well-developed and well-nourished. No distress.  HENT:  Head: Normocephalic.  Mouth/Throat: Oropharynx is clear and moist. No oropharyngeal exudate.  Eyes: Conjunctivae and EOM are normal. Pupils are equal, round, and reactive to light. No scleral icterus.  Neck: Normal range of motion. Neck supple. No tracheal deviation present.  Cardiovascular: Normal rate, regular rhythm and intact distal pulses.   Pulmonary/Chest: Effort normal and breath sounds normal. No respiratory distress.  Abdominal: Soft. He exhibits no distension. There is no tenderness. Hernia confirmed negative in the right inguinal area and confirmed negative in the left inguinal area.       Morbidly obese with mild supraumbilical diastasis recti  Genitourinary:       Exam done with assistance of male Medical Assistant in the room.  Perianal skin clean with good hygiene.  No pruritis.  Left postlat ext hemorrhoid.  No other external skin tags / hemorrhoids of significance.  No pilonidal disease.  No fissure.  No abscess/fistula.    Tolerates digital and anoscopic rectal exam but sensitive.  Normal sphincter tone.   No rectal masses.  Hemorrhoidal piles L lat >>R ant enlarged   Musculoskeletal: Normal range of motion. He exhibits no tenderness.  Lymphadenopathy:    He has no cervical adenopathy.       Right: No inguinal adenopathy present.       Left: No inguinal adenopathy present.  Neurological: He is alert and oriented  to person, place, and time. No cranial nerve deficit. He exhibits normal muscle tone. Coordination normal.  Skin: Skin is warm and dry. No rash noted. He is not diaphoretic. No erythema. No pallor.  Psychiatric: He has a normal mood and affect. His behavior is normal. Judgment and thought content normal.       Assessment:     Bleeding hemorrhoid (L lat)     Plan:     Banding:  The anatomy & physiology of the anorectal region was discussed.  The pathophysiology of hemorrhoids and differential diagnosis was discussed.  Natural history progression  was discussed.   I stressed the importance of a bowel regimen to have daily soft bowel movements to minimize progression of disease.     The patient's symptoms are not adequately controlled.  Therefore, I recommended banding to treat the hemorrhoids.  I went over the technique, risks, benefits, and alternatives.   Goals of post-operative recovery were discussed as well.  Questions were answered.  The patient expressed understanding & wished to proceed.  The patient was positioned in the lateral decubitus position.  Perianal & rectal examination was done.  Using anoscopy, I ligated the left lateral hemorrhoid above the dentate line with banding.  The patient tolerated the procedure OK - very sensitive at the anal canal.  Could not tolerate R ant hemorrhoid eval/touch - banding aborted for that pile.  Educational handouts further explaining the pathology, treatment options, and bowel regimen were given as well.   Increase activity as tolerated.  Do not push through pain.  Advanced on diet as tolerated. Bowel regimen to avoid problems.  Return to clinic p.r.n. The patient expressed understanding and appreciation

## 2011-10-07 ENCOUNTER — Ambulatory Visit (INDEPENDENT_AMBULATORY_CARE_PROVIDER_SITE_OTHER): Payer: Medicare Other | Admitting: Family Medicine

## 2011-10-07 ENCOUNTER — Encounter: Payer: Self-pay | Admitting: Family Medicine

## 2011-10-07 VITALS — BP 120/78 | HR 100 | Temp 98.3°F | Ht 72.0 in | Wt 255.0 lb

## 2011-10-07 DIAGNOSIS — T148XXA Other injury of unspecified body region, initial encounter: Secondary | ICD-10-CM | POA: Diagnosis not present

## 2011-10-07 DIAGNOSIS — R5381 Other malaise: Secondary | ICD-10-CM | POA: Diagnosis not present

## 2011-10-07 DIAGNOSIS — R5383 Other fatigue: Secondary | ICD-10-CM

## 2011-10-07 DIAGNOSIS — W57XXXA Bitten or stung by nonvenomous insect and other nonvenomous arthropods, initial encounter: Secondary | ICD-10-CM | POA: Diagnosis not present

## 2011-10-07 DIAGNOSIS — T148 Other injury of unspecified body region: Secondary | ICD-10-CM | POA: Diagnosis not present

## 2011-10-07 NOTE — Progress Notes (Signed)
Nature conservation officer at [facility] [address] [place] [address] Phone: [phone] Fax: [phone]   Patient Name: [name] Date of Birth: 08-23-1945 Age: 66 y.o. Medical Record Number: [id number] Gender: male Date of Encounter: 10/07/2011  History of Present Illness:  [name] is a 66 y.o. very pleasant male patient who presents with the following:  Found a tick about one month ago. He never got sick, never had a fever. He never had a rash, never had any arthralgias or myalgias. He was curious because he still had a small spot in the area.  [name], and he is feeling somewhat better after starting AndroGel about 3 weeks ago or so. He is due to have his testosterone level rechecked. [name]  Past Medical History, Surgical History, Social History, Family History, Problem List, Medications, and Allergies have been reviewed and updated if relevant.  Prior to Admission medications   Medication Sig Start Date End Date Taking? Authorizing Provider  albuterol (PROVENTIL HFA;VENTOLIN HFA) 108 (90 BASE) MCG/ACT inhaler Inhale 2 puffs into the lungs every 6 (six) hours as needed for wheezing. 09/20/11 09/19/12 Yes Joaquim Nam, MD  aspirin 325 MG tablet Take 325 mg by mouth daily.     Yes Historical Provider, MD  atenolol (TENORMIN) 100 MG tablet TAKE ONE TABLET BY MOUTH EVERY DAY 04/15/11  Yes Hannah Beat, MD  b complex vitamins tablet Take 1 tablet by mouth daily.     Yes Historical Provider, MD  cholecalciferol (VITAMIN D) 1000 UNITS tablet Take 1,000 Units by mouth daily.     Yes Historical Provider, MD  doxazosin (CARDURA) 8 MG tablet TAKE ONE TABLET BY MOUTH EVERY DAY AT BEDTIME 01/11/11  Yes Analucia Hush, MD  famotidine (PEPCID) 10 MG tablet Take 10 mg by mouth daily.     Yes Historical Provider, MD  ferrous sulfate 325 (65 FE) MG tablet Take 325 mg by mouth daily with breakfast.   Yes Historical  Provider, MD  fluticasone (FLONASE) 50 MCG/ACT nasal spray Place 2 sprays into the nose daily as needed. 04/26/11  Yes Meziah Blasingame, MD  glucose blood test strip (Onetouch Ultra) test strips-check fasting blood sugar and after breakfast, lunch, and dinner 01/11/11  Yes Hannah Beat, MD  indomethacin (INDOCIN) 25 MG capsule Take two tablets every 12 hours as needed for gout pain    Yes Historical Provider, MD  insulin NPH (HUMULIN N,NOVOLIN N) 100 UNIT/ML injection Inject 30 Units into the skin at bedtime. 06/03/11 06/02/12 Yes Romero Belling, MD  insulin regular (NOVOLIN R,HUMULIN R) 100 units/mL injection 3x a day (just before each meal) 50-40-60 units (and 100-unit syringes 4/day) 04/27/11  Yes Romero Belling, MD  lisinopril-hydrochlorothiazide (PRINZIDE,ZESTORETIC) 20-12.5 MG per tablet TAKE ONE TABLET BY MOUTH EVERY DAY 04/15/11  Yes Hannah Beat, MD  niacin (NIASPAN) 1000 MG CR tablet Take 1 tablet (1,000 mg total) by mouth at bedtime. Take an aspirin before taking the niaspan tablet 04/26/11 04/25/12 Yes Tasmin Exantus, MD  simvastatin (ZOCOR) 40 MG tablet Take 1 tablet (40 mg total) by mouth at bedtime. 08/20/11 08/19/12 Yes Jamala Kohen, MD  Testosterone (ANDROGEL PUMP) 1.25 GM/ACT (1%) GEL Place 4 Squirts onto the skin daily. Clean and dry shoulders 09/21/11  Yes Tosca Pletz, MD  venlafaxine (EFFEXOR XR) 75 MG 24 hr capsule Take 3 capsules (225 mg total) by mouth daily. 04/26/11 04/25/12 Yes Hannah Beat, MD  Review of Systems:  GEN: No acute illnesses, no fevers, chills. GI: No n/v/d, eating normally Pulm: No SOB Interactive and getting along well at home.  Otherwise, ROS is as per the HPI.   Physical Examination: Filed Vitals:   10/07/11 1547  BP: 120/78  Pulse: 100  Temp: 98.3 F (36.8 C)   Filed Vitals:   10/07/11 1547  Height: 6' (1.829 m)  Weight: 255 lb (115.667 kg)   Body mass index is 34.58 kg/(m^2). Ideal Body Weight: Weight in (lb) to have BMI = 25: 183.9      GEN: WDWN, NAD, Non-toxic, A & O x 3 HEENT: Atraumatic, Normocephalic. Neck supple. No masses, No LAD. Ears and Nose: No external deformity. CV: RRR, No M/G/R. No JVD. No thrill. No extra heart sounds. PULM: CTA B, no wheezes, crackles, rhonchi. No retractions. No resp. distress. No accessory muscle use. EXTR: No c/c/e NEURO Normal gait.  PSYCH: Normally interactive. Conversant. Not depressed or anxious appearing.  Calm demeanor.   SKIN: small, healing, 1/2 cm area slightly raised  Assessment and Plan:  1. Other malaise and fatigue  Testosterone, free, total  2. Tick bite     Reassured him about his tick bite. He does feel somewhat better after starting his AndroGel, so we will see how his levels turn out, and then reassess after more time has elapsed  Orders Today: Orders Placed This Encounter  Procedures  . Testosterone, free, total    Medications Today: No orders of the defined types were placed in this encounter.     Hannah Beat, MD

## 2011-10-08 ENCOUNTER — Other Ambulatory Visit: Payer: Self-pay | Admitting: *Deleted

## 2011-10-08 MED ORDER — VENLAFAXINE HCL ER 75 MG PO CP24: 225.0000 mg | ORAL_CAPSULE | Freq: Every day | ORAL | Status: DC

## 2011-10-11 ENCOUNTER — Encounter: Payer: Self-pay | Admitting: *Deleted

## 2011-10-18 ENCOUNTER — Ambulatory Visit (INDEPENDENT_AMBULATORY_CARE_PROVIDER_SITE_OTHER): Payer: Medicare Other | Admitting: Family Medicine

## 2011-10-18 ENCOUNTER — Telehealth: Payer: Self-pay

## 2011-10-18 ENCOUNTER — Encounter: Payer: Self-pay | Admitting: Family Medicine

## 2011-10-18 VITALS — BP 160/82 | HR 87 | Temp 97.6°F | Ht 72.0 in | Wt 250.5 lb

## 2011-10-18 DIAGNOSIS — M79609 Pain in unspecified limb: Secondary | ICD-10-CM | POA: Diagnosis not present

## 2011-10-18 DIAGNOSIS — B351 Tinea unguium: Secondary | ICD-10-CM | POA: Diagnosis not present

## 2011-10-18 DIAGNOSIS — M109 Gout, unspecified: Secondary | ICD-10-CM | POA: Diagnosis not present

## 2011-10-18 MED ORDER — INDOMETHACIN 25 MG PO CAPS: 25.0000 mg | ORAL_CAPSULE | Freq: Two times a day (BID) | ORAL | Status: DC

## 2011-10-18 MED ORDER — HYDROCODONE-ACETAMINOPHEN 5-500 MG PO TABS: 1.0000 | ORAL_TABLET | Freq: Four times a day (QID) | ORAL | Status: AC | PRN

## 2011-10-18 MED ORDER — VENLAFAXINE HCL ER 75 MG PO CP24: 225.0000 mg | ORAL_CAPSULE | Freq: Every day | ORAL | Status: DC

## 2011-10-18 NOTE — Telephone Encounter (Signed)
[name] with [facility] wanted to clarify instructions for Indocin; sig was 1 tab bid with food and then 2 tab bid for gout pain. [facility] filled with 2 tab bid for gout pain but wants to clarify.Please advise.

## 2011-10-18 NOTE — Telephone Encounter (Signed)
1 tab po bid    (If I put 2 tabs po bid, then it was a mistake)

## 2011-10-18 NOTE — Telephone Encounter (Signed)
Rob advised

## 2011-10-18 NOTE — Progress Notes (Signed)
Nature conservation officer at [facility] [address] [place] [address] Phone: [phone] Fax: [phone]   Patient Name: [name] Date of Birth: [birth year] Medical Record Number: [id number] Gender: male Date of Encounter: 10/18/2011  Chief Complaint: Nail Problem   History of Present Illness:  [name] is a 66 y.o. very pleasant male patient who presents with the following:  Pt with IDDM presents with trauma to R medial great toe after trying to cut his toenails over the weekend, medial aspect split to base and fell off. Now in pain. No bruising or redness.   Also with R 1st MTP pain, history of known gout.  Also needs effexor refill  Patient Active Problem List  Diagnosis  . HYPERLIPIDEMIA  . GOUT  . DEPRESSION  . HYPERTENSION  . ALLERGIC RHINITIS  . GERD  . OSTEOARTHRITIS  . HIP REPLACEMENT, RIGHT, HX OF  . History of smoking  . Family history of MI (myocardial infarction)  . Diabetic neuropathy  . OSA (obstructive sleep apnea)  . Type I (juvenile type) diabetes mellitus with neurological manifestations, uncontrolled  . Cough  . Hemorrhoids with bleeding (L lateral pile especially)  . Obesity (BMI 30-39.9)   Past Medical History  Diagnosis Date  . Depression   . Diabetes mellitus   . Hyperlipidemia   . Hypertension   . Osteoarthritis   . Allergic rhinitis due to pollen   . GERD (gastroesophageal reflux disease)   . Gout   . Diverticulosis   . Personal history of colonic polyps     1996   Past Surgical History  Procedure Date  . Cholecystectomy 1980  . Total hip arthroplasty 2009    Dr. Darrelyn Hillock  . Total hip arthroplasty    History  Substance Use Topics  . Smoking status: Former Smoker -- 3.0 packs/day for 35 years    Types: Cigarettes    Quit date: 04/12/2000  . Smokeless tobacco: Never Used  . Alcohol Use: No   Family History  Problem Relation Age of Onset  . Diabetes Mother   . Diabetes Father   . Lung cancer Sister       lung  . Heart disease Maternal Grandfather   . COPD Sister   . Heart attack Sister   . Heart disease Sister    Allergies  Allergen Reactions  . Tetracycline     REACTION: rash    Medication list has been reviewed and updated.  Current Outpatient Prescriptions on File Prior to Visit  Medication Sig Dispense Refill  . albuterol (PROVENTIL HFA;VENTOLIN HFA) 108 (90 BASE) MCG/ACT inhaler Inhale 2 puffs into the lungs every 6 (six) hours as needed for wheezing.  1 Inhaler  0  . aspirin 325 MG tablet Take 325 mg by mouth daily.        [name] Kitchen atenolol (TENORMIN) 100 MG tablet TAKE ONE TABLET BY MOUTH EVERY DAY  90 tablet  3  . b complex vitamins tablet Take 1 tablet by mouth daily.        . cholecalciferol (VITAMIN D) 1000 UNITS tablet Take 1,000 Units by mouth daily.        [name] Kitchen doxazosin (CARDURA) 8 MG tablet TAKE ONE TABLET BY MOUTH EVERY DAY AT BEDTIME  90 tablet  3  . famotidine (PEPCID) 10 MG tablet Take 10 mg by mouth daily.        . ferrous sulfate 325 (65 FE) MG tablet Take 325 mg by mouth daily with breakfast.      .  fluticasone (FLONASE) 50 MCG/ACT nasal spray Place 2 sprays into the nose daily as needed.      [name] Kitchen glucose blood test strip (Onetouch Ultra) test strips-check fasting blood sugar and after breakfast, lunch, and dinner  150 each  3  . insulin NPH (HUMULIN N,NOVOLIN N) 100 UNIT/ML injection Inject 30 Units into the skin at bedtime.      . insulin regular (NOVOLIN R,HUMULIN R) 100 units/mL injection 3x a day (just before each meal) 50-40-60 units (and 100-unit syringes 4/day)      . lisinopril-hydrochlorothiazide (PRINZIDE,ZESTORETIC) 20-12.5 MG per tablet TAKE ONE TABLET BY MOUTH EVERY DAY  90 tablet  3  . niacin (NIASPAN) 1000 MG CR tablet Take 1 tablet (1,000 mg total) by mouth at bedtime. Take an aspirin before taking the niaspan tablet  30 tablet  5  . simvastatin (ZOCOR) 40 MG tablet Take 1 tablet (40 mg total) by mouth at bedtime.  30 tablet  11  . Testosterone (ANDROGEL  PUMP) 1.25 GM/ACT (1%) GEL Place 4 Squirts onto the skin daily. Clean and dry shoulders  2 Bottle  3  . DISCONTD: venlafaxine XR (EFFEXOR XR) 75 MG 24 hr capsule Take 3 capsules (225 mg total) by mouth daily.  90 capsule  1    Review of Systems:   GEN: No acute illnesses, no fevers, chills. GI: No n/v/d, eating normally Pulm: No SOB Interactive and getting along well at home.  Otherwise, ROS is as per the HPI.   Physical Examination: Filed Vitals:   10/18/11 0948  BP: 160/82  Pulse: 87  Temp: 97.6 F (36.4 C)   Filed Vitals:   10/18/11 0948  Height: 6' (1.829 m)  Weight: 250 lb 8 oz (113.626 kg)   Body mass index is 33.97 kg/(m^2). Ideal Body Weight: Weight in (lb) to have BMI = 25: 183.9    GEN: WDWN, NAD, Non-toxic, A & O x 3 HEENT: Atraumatic, Normocephalic. Neck supple. No masses, No LAD. Ears and Nose: No external deformity. CV: RRR, No M/G/R. No JVD. No thrill. No extra heart sounds. PULM: CTA B, no wheezes, crackles, rhonchi. No retractions. No resp. distress. No accessory muscle use. EXTR: No c/c/e NEURO Normal gait.  PSYCH: Normally interactive. Conversant. Not depressed or anxious appearing.  Calm demeanor.   SKIN: R great toe with medial split, some small amount of scab in place and no active bleeding  EKG / Labs / Xrays: None available at time of encounter  Assessment and Plan:  1. Pain due to onychomycosis of toenail   2. Gout    Indocin for gout Used silver nitrate on edge of toenail for cautery  Refill effexor  Orders Today: No orders of the defined types were placed in this encounter.    Medications Today: Meds ordered this encounter  Medications  . indomethacin (INDOCIN) 25 MG capsule    Sig: Take 1 capsule (25 mg total) by mouth 2 (two) times daily with a meal. Take two tablets every 12 hours as needed for gout pain    Dispense:  60 capsule    Refill:  2  . HYDROcodone-acetaminophen (VICODIN) 5-500 MG per tablet    Sig: Take 1 tablet  by mouth every 6 (six) hours as needed for pain.    Dispense:  40 tablet    Refill:  0  . venlafaxine XR (EFFEXOR XR) 75 MG 24 hr capsule    Sig: Take 3 capsules (225 mg total) by mouth daily.    Dispense:  270 capsule    Refill:  3     Hannah Beat, MD

## 2011-10-19 ENCOUNTER — Other Ambulatory Visit: Payer: Self-pay | Admitting: Family Medicine

## 2011-10-19 DIAGNOSIS — E291 Testicular hypofunction: Secondary | ICD-10-CM

## 2011-10-21 ENCOUNTER — Other Ambulatory Visit: Payer: Medicare Other

## 2011-10-25 ENCOUNTER — Ambulatory Visit: Payer: Medicare Other | Admitting: Family Medicine

## 2011-11-04 ENCOUNTER — Ambulatory Visit: Payer: Medicare Other | Admitting: Endocrinology

## 2011-11-05 DIAGNOSIS — H43399 Other vitreous opacities, unspecified eye: Secondary | ICD-10-CM | POA: Diagnosis not present

## 2011-11-05 DIAGNOSIS — E109 Type 1 diabetes mellitus without complications: Secondary | ICD-10-CM | POA: Diagnosis not present

## 2011-11-05 DIAGNOSIS — H2510 Age-related nuclear cataract, unspecified eye: Secondary | ICD-10-CM | POA: Diagnosis not present

## 2011-11-08 ENCOUNTER — Ambulatory Visit (INDEPENDENT_AMBULATORY_CARE_PROVIDER_SITE_OTHER): Payer: Medicare Other | Admitting: Family Medicine

## 2011-11-08 ENCOUNTER — Encounter: Payer: Self-pay | Admitting: Family Medicine

## 2011-11-08 VITALS — BP 128/60 | HR 80 | Temp 99.3°F | Wt 248.5 lb

## 2011-11-08 DIAGNOSIS — R079 Chest pain, unspecified: Secondary | ICD-10-CM | POA: Diagnosis not present

## 2011-11-08 NOTE — Progress Notes (Signed)
Nature conservation officer at [facility] [address] [place] [address] Phone: [phone] Fax: [phone]  Date:  11/08/2011   Name:  [name]   DOB:  1946/03/06   [name]:  [id number]  PCP:  Hannah Beat, MD    Chief Complaint: no energy   History of Present Illness:  [name] is a 66 y.o. very pleasant male patient who presents with the following:  A significant history of poorly controlled diabetes mellitus, significant obesity, hyperlipidemia, hypertension, very strong family history of cardiac disease and myocardial infarction, obstructive sleep apnea.  He presents with a chief complaint of fatigue and chest pain that began yesterday as well as some pain in his LEFT arm, chest pain and LEFT arm pain are absent currently, but they lasted for about 20 minutes yesterday without being provoked. He was not eating anything or postprandial, he did only sit, and then they occurred for approximately 20 minutes. Yesterday, had some pain in his L arm and chest pain yesterday.  15 - 20 mins, was sitting in the recliner. Yesterday.  No shortness of breath.  He did have a LexiScan Myoview approximately one year ago that was normal for preoperative evaluation.  Sister had to have CABG, and MI x 2. (in her 63's) Father had PCI, had a stent when in 69's All 4 GP with cardiac problems PGF: 4-5 MI's PGM: 1 MI MGF: multple MI and d/c MGM: lived quite a while, no MI, but some ? Heart medicine.  Within the last month we did start him on some AndroGel for fatigue, and his symptoms actually had gotten quite a bit better until one day prior to coming into the office.  Past Medical History, Surgical History, Social History, Family History, Problem List, Medications, and Allergies have been reviewed and updated if relevant.  Current Outpatient Prescriptions on File Prior to Visit  Medication Sig Dispense Refill  . albuterol (PROVENTIL HFA;VENTOLIN HFA) 108 (90 BASE) MCG/ACT  inhaler Inhale 2 puffs into the lungs every 6 (six) hours as needed for wheezing.  1 Inhaler  0  . aspirin 325 MG tablet Take 325 mg by mouth daily.        [name] Kitchen atenolol (TENORMIN) 100 MG tablet TAKE ONE TABLET BY MOUTH EVERY DAY  90 tablet  3  . b complex vitamins tablet Take 1 tablet by mouth daily.        . cholecalciferol (VITAMIN D) 1000 UNITS tablet Take 1,000 Units by mouth daily.        [name] Kitchen doxazosin (CARDURA) 8 MG tablet TAKE ONE TABLET BY MOUTH EVERY DAY AT BEDTIME  90 tablet  3  . famotidine (PEPCID) 10 MG tablet Take 10 mg by mouth daily.        . ferrous sulfate 325 (65 FE) MG tablet Take 325 mg by mouth daily with breakfast.      . fluticasone (FLONASE) 50 MCG/ACT nasal spray Place 2 sprays into the nose daily as needed.      [name] Kitchen glucose blood test strip (Onetouch Ultra) test strips-check fasting blood sugar and after breakfast, lunch, and dinner  150 each  3  . indomethacin (INDOCIN) 25 MG capsule Take 1 capsule (25 mg total) by mouth 2 (two) times daily with a meal. Take two tablets every 12 hours as needed for gout pain  60 capsule  2  . insulin NPH (HUMULIN N,NOVOLIN N) 100 UNIT/ML injection Inject 30 Units into the skin at bedtime.      [name] Kitchen  insulin regular (NOVOLIN R,HUMULIN R) 100 units/mL injection 3x a day (just before each meal) 50-40-60 units (and 100-unit syringes 4/day)      . lisinopril-hydrochlorothiazide (PRINZIDE,ZESTORETIC) 20-12.5 MG per tablet TAKE ONE TABLET BY MOUTH EVERY DAY  90 tablet  3  . niacin (NIASPAN) 1000 MG CR tablet Take 1 tablet (1,000 mg total) by mouth at bedtime. Take an aspirin before taking the niaspan tablet  30 tablet  5  . simvastatin (ZOCOR) 40 MG tablet Take 1 tablet (40 mg total) by mouth at bedtime.  30 tablet  11  . Testosterone (ANDROGEL PUMP) 1.25 GM/ACT (1%) GEL Place 4 Squirts onto the skin daily. Clean and dry shoulders  2 Bottle  3  . venlafaxine XR (EFFEXOR XR) 75 MG 24 hr capsule Take 3 capsules (225 mg total) by mouth daily.  270 capsule  3     Review of Systems: Chest pain as above. No shortness of breath. No abdominal pain. Fatigue. Otherwise as above.  Physical Examination: Filed Vitals:   11/08/11 1053  BP: 128/60  Pulse: 80  Temp: 99.3 F (37.4 C)   Filed Vitals:   11/08/11 1053  Weight: 248 lb 8 oz (112.719 kg)   There is no height on file to calculate BMI. Ideal Body Weight:     GEN: WDWN, NAD, Non-toxic, A & O x 3 HEENT: Atraumatic, Normocephalic. Neck supple. No masses, No LAD. Ears and Nose: No external deformity. CV: RRR, No M/G/R. No JVD. No thrill. No extra heart sounds. PULM: CTA B, no wheezes, crackles, rhonchi. No retractions. No resp. distress. No accessory muscle use. EXTR: No c/c/e NEURO Normal gait.  PSYCH: Normally interactive. Conversant. Not depressed or anxious appearing.  Calm demeanor.    Assessment and Plan:  1. Chest pain  EKG 12-Lead, Ambulatory referral to Cardiology   EKG: Normal sinus rhythm. Normal axis, normal R wave progression, No acute ST elevation or depression.  Concerning the history in a patient with a very high risk profile for cardiac disease. It is reassuring that he had a LexiScan myoview that was normal last year. In this case, however, given his risk factors of family history I think it is most prudent to have him see cardiology for a formal consult and recommendations in potential ischemic heart disease.  Orders Today:  Orders Placed This Encounter  Procedures  . Ambulatory referral to Cardiology    Referral Priority:  Routine    Referral Type:  Consultation    Referral Reason:  Specialty Services Required    Requested Specialty:  Cardiology    Number of Visits Requested:  1  . EKG 12-Lead    Medications Today: (Includes new updates added during medication reconciliation) No orders of the defined types were placed in this encounter.     Hannah Beat, MD

## 2011-11-11 ENCOUNTER — Encounter: Payer: Self-pay | Admitting: Cardiovascular Disease

## 2011-11-11 ENCOUNTER — Ambulatory Visit (INDEPENDENT_AMBULATORY_CARE_PROVIDER_SITE_OTHER): Payer: Medicare Other | Admitting: Cardiovascular Disease

## 2011-11-11 ENCOUNTER — Other Ambulatory Visit: Payer: Medicare Other

## 2011-11-11 VITALS — BP 95/60 | HR 84 | Ht 72.0 in | Wt 243.8 lb

## 2011-11-11 DIAGNOSIS — E785 Hyperlipidemia, unspecified: Secondary | ICD-10-CM

## 2011-11-11 DIAGNOSIS — R0602 Shortness of breath: Secondary | ICD-10-CM

## 2011-11-11 DIAGNOSIS — Z01818 Encounter for other preprocedural examination: Secondary | ICD-10-CM | POA: Diagnosis not present

## 2011-11-11 DIAGNOSIS — I10 Essential (primary) hypertension: Secondary | ICD-10-CM | POA: Diagnosis not present

## 2011-11-11 DIAGNOSIS — R079 Chest pain, unspecified: Secondary | ICD-10-CM | POA: Diagnosis not present

## 2011-11-11 HISTORY — PX: CARDIAC CATHETERIZATION: SHX172

## 2011-11-11 NOTE — Assessment & Plan Note (Addendum)
The patient's chest pain is highly worrisome for a cardiac ischemic etiology in the setting of multiple risk factors for coronary artery disease. He has other associated symptoms including exertional fatigue and dyspnea over the last week. There is high pretest probability of underlying obstructive coronary artery disease. Thus, I recommend proceeding with cardiac catheterization and possible coronary intervention. The patient does have chronic kidney disease and will have to see what his current renal function is. I will request an echocardiogram to evaluate his LV systolic function so that we don't have to do left ventricular angiography. Depending on his creatinine level, he might require pre and post hydration.  He is already on aspirin ,simvastatin and a beta blocker.

## 2011-11-11 NOTE — Progress Notes (Signed)
HPI  This is a 66 year old [name] who was referred by Dr. Patsy Lager for evaluation of chest pain and fatigue. The patient has no previous cardiac history. He has many risk factors for coronary artery disease including prolonged history of diabetes, hypertension, hyperlipidemia, chronic kidney disease and strong family history of coronary artery disease. He had a normal Lexiscan Myoview done last year for preoperative evaluation. At that time he had no cardiac symptoms. The patient had an episode of chest pain on Saturday which lasted about 20 minutes. It was described as substernal and left-sided tightness radiating to his left arm. He took 4 aspirin at that time. Over the last week, he has been feeling tired with no energy. He gets dyspnea with activity. He has not had any recurrent chest pain. His physical activities are limited due to significant arthritis. He walks with a cane. He never had cardiac catheterization in the past.  Allergies  Allergen Reactions  . Tetracycline     REACTION: rash     Current Outpatient Prescriptions on File Prior to Visit  Medication Sig Dispense Refill  . albuterol (PROVENTIL HFA;VENTOLIN HFA) 108 (90 BASE) MCG/ACT inhaler Inhale 2 puffs into the lungs every 6 (six) hours as needed for wheezing.  1 Inhaler  0  . aspirin 325 MG tablet Take 325 mg by mouth daily.        [name] Kitchen atenolol (TENORMIN) 100 MG tablet TAKE ONE TABLET BY MOUTH EVERY DAY  90 tablet  3  . b complex vitamins tablet Take 1 tablet by mouth daily.        . cholecalciferol (VITAMIN D) 1000 UNITS tablet Take 1,000 Units by mouth daily.        [name] Kitchen doxazosin (CARDURA) 8 MG tablet TAKE ONE TABLET BY MOUTH EVERY DAY AT BEDTIME  90 tablet  3  . famotidine (PEPCID) 10 MG tablet Take 10 mg by mouth daily.        . ferrous sulfate 325 (65 FE) MG tablet Take 325 mg by mouth daily with breakfast.      . fluticasone (FLONASE) 50 MCG/ACT nasal spray Place 2 sprays into the nose daily as needed.      [name] Kitchen glucose  blood test strip (Onetouch Ultra) test strips-check fasting blood sugar and after breakfast, lunch, and dinner  150 each  3  . indomethacin (INDOCIN) 25 MG capsule Take 1 capsule (25 mg total) by mouth 2 (two) times daily with a meal. Take two tablets every 12 hours as needed for gout pain  60 capsule  2  . insulin NPH (HUMULIN N,NOVOLIN N) 100 UNIT/ML injection Inject 30 Units into the skin at bedtime.      . insulin regular (NOVOLIN R,HUMULIN R) 100 units/mL injection 3x a day (just before each meal) 50-40-60 units (and 100-unit syringes 4/day)      . lisinopril-hydrochlorothiazide (PRINZIDE,ZESTORETIC) 20-12.5 MG per tablet TAKE ONE TABLET BY MOUTH EVERY DAY  90 tablet  3  . niacin (NIASPAN) 1000 MG CR tablet Take 1 tablet (1,000 mg total) by mouth at bedtime. Take an aspirin before taking the niaspan tablet  30 tablet  5  . simvastatin (ZOCOR) 40 MG tablet Take 1 tablet (40 mg total) by mouth at bedtime.  30 tablet  11  . Testosterone (ANDROGEL PUMP) 1.25 GM/ACT (1%) GEL Place 4 Squirts onto the skin daily. Clean and dry shoulders  2 Bottle  3  . venlafaxine XR (EFFEXOR XR) 75 MG 24 hr capsule Take 3 capsules (  225 mg total) by mouth daily.  270 capsule  3     Past Medical History  Diagnosis Date  . Depression   . Diabetes mellitus   . Hyperlipidemia   . Hypertension   . Osteoarthritis   . Allergic rhinitis due to pollen   . GERD (gastroesophageal reflux disease)   . Gout   . Diverticulosis   . Personal history of colonic polyps     1996  . Low testosterone   . Iron deficiency   . CKD (chronic kidney disease)      Past Surgical History  Procedure Date  . Cholecystectomy 1980  . Total hip arthroplasty 2009    Dr. Darrelyn Hillock  . Total hip arthroplasty      Family History  Problem Relation Age of Onset  . Diabetes Mother   . Diabetes Father   . Lung cancer Sister     lung  . Heart disease Maternal Grandfather   . COPD Sister   . Heart attack Sister   . Heart disease  Sister      History   Social History  . Marital Status: Married    Spouse Name: N/A    Number of Children: N/A  . 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 Smoking status: Former Smoker -- 3.0 packs/day for 35 years    Types: Cigarettes    Quit date: 04/12/2000  . Smokeless tobacco: Never Used  . Alcohol Use: No  . Drug Use: No  . Sexually Active: Not on file   Other Topics Concern  . Not on file   Social History Narrative   No regular exercise     ROS Constitutional: Negative for fever, chills, diaphoresis, activity change, appetite change and fatigue.  HENT: Negative for hearing loss, nosebleeds, congestion, sore throat, facial swelling, drooling, trouble swallowing, neck pain, voice change, sinus pressure and tinnitus.  Eyes: Negative for photophobia, pain, discharge and visual disturbance.  Respiratory: Negative for apnea, cough and wheezing.  Cardiovascular: Negative for  palpitations and leg swelling.  Gastrointestinal: Negative for nausea, vomiting, abdominal pain, diarrhea, constipation, blood in stool and abdominal distention.  Genitourinary: Negative for dysuria, urgency, frequency, hematuria and decreased urine volume.  Musculoskeletal: Negative for myalgias, back pain, joint swelling, arthralgias and gait problem.  Skin: Negative for color change, pallor, rash and wound.  Neurological: Negative for dizziness, tremors, seizures, syncope, speech difficulty, weakness, light-headedness, numbness and headaches.  Psychiatric/Behavioral: Negative for suicidal ideas, hallucinations, behavioral problems and agitation. The patient is not nervous/anxious.     PHYSICAL EXAM   BP 95/60  Pulse 84  Ht 6' (1.829 m)  Wt 243 lb 12 oz (110.564 kg)  BMI 33.06 kg/m2 Constitutional: He is oriented to person, place, and time. He appears well-developed and well-nourished. No distress.  HENT: No  nasal discharge.  Head: Normocephalic and atraumatic.  Eyes: Pupils are equal and round. Right eye exhibits no discharge. Left eye exhibits no discharge.  Neck: Normal range of motion. Neck supple. No JVD present. No thyromegaly present.  Cardiovascular: Normal rate, regular rhythm, normal heart sounds and. Exam reveals no gallop and no friction rub. No murmur heard.  Pulmonary/Chest: Effort normal and breath sounds normal. No stridor. No respiratory distress. He has no wheezes. He has no rales. He exhibits no tenderness.  Abdominal: Soft. Bowel sounds are normal. He exhibits no distension. There is no tenderness. There  is no rebound and no guarding.  Musculoskeletal: Normal range of motion. He exhibits trace edema and no tenderness.  Neurological: He is alert and oriented to person, place, and time. Coordination normal.  Skin: Skin is warm and dry. Varicose veins are noted in the lower extremities. He is not diaphoretic. No erythema. No pallor.  Psychiatric: He has a normal mood and affect. His behavior is normal. Judgment and thought content normal.       EKG: Sinus  Rhythm  WITHIN NORMAL LIMITS   ASSESSMENT AND PLAN

## 2011-11-11 NOTE — Assessment & Plan Note (Signed)
Lab Results  Component Value Date   CHOL 129 04/27/2011   HDL 36.60* 04/27/2011   LDLCALC 43 04/24/2009   LDLDIRECT 36.6 04/27/2011   TRIG 452.0* 04/27/2011   CHOLHDL 4 04/27/2011   LDL is at target. However, he has low HDL and elevated triglyceride. He continued treatment with simvastatin for now. He might benefit from adding fish oil.

## 2011-11-11 NOTE — Patient Instructions (Addendum)
Your physician has requested that you have an echocardiogram. Echocardiography is a painless test that uses sound waves to create images of your heart. It provides your doctor with information about the size and shape of your heart and how well your heart's chambers and valves are working. This procedure takes approximately one hour. There are no restrictions for this procedure.  Your physician has requested that you have a cardiac catheterization. Cardiac catheterization is used to diagnose and/or treat various heart conditions. Doctors may recommend this procedure for a number of different reasons. The most common reason is to evaluate chest pain. Chest pain can be a symptom of coronary artery disease (CAD), and cardiac catheterization can show whether plaque is narrowing or blocking your heart's arteries. This procedure is also used to evaluate the valves, as well as measure the blood flow and oxygen levels in different parts of your heart. For further information please visit [email or web address] Please follow instruction sheet, as given.    Decrease Doxazocin (Cardura) to 4 mg once daily (1/2 tablet of 8 mg)

## 2011-11-11 NOTE — Assessment & Plan Note (Addendum)
His blood pressure is low today and he is orthostatic. Thus, I recommend decreasing the dose of Cardura to 4 mg daily. The patient reports no history of BPH. It might be beneficial to take him off this medication altogether at some point.

## 2011-11-12 LAB — BASIC METABOLIC PANEL
BUN/Creatinine Ratio: 15 (ref 10–22)
CO2: 21 mmol/L (ref 19–28)
Chloride: 94 mmol/L — ABNORMAL LOW (ref 97–108)
GFR calc Af Amer: 44 mL/min/{1.73_m2} — ABNORMAL LOW (ref >59)
Potassium: 3.8 mmol/L (ref 3.5–5.2)
Sodium: 135 mmol/L (ref 134–144)

## 2011-11-12 LAB — CBC WITH DIFFERENTIAL/PLATELET
Basophils Absolute: 0 10*3/uL (ref 0.0–0.2)
Eosinophils Absolute: 0 10*3/uL (ref 0.0–0.4)
Immature Grans (Abs): 0 10*3/uL (ref 0.0–0.1)
Lymphs: 19 % (ref 14–46)
MCH: 31.2 pg (ref 26.6–33.0)
MCHC: 33.9 g/dL (ref 31.5–35.7)
MCV: 92 fL (ref 79–97)
Monocytes Absolute: 1.7 10*3/uL — ABNORMAL HIGH (ref 0.1–1.0)
Neutrophils Absolute: 5.7 10*3/uL (ref 1.8–7.8)
Neutrophils Relative %: 63 % (ref 40–74)

## 2011-11-12 LAB — PROTIME-INR: INR: 1 (ref 0.8–1.2)

## 2011-11-15 ENCOUNTER — Other Ambulatory Visit (INDEPENDENT_AMBULATORY_CARE_PROVIDER_SITE_OTHER): Payer: Medicare Other

## 2011-11-15 ENCOUNTER — Other Ambulatory Visit: Payer: Self-pay | Admitting: Cardiovascular Disease

## 2011-11-15 ENCOUNTER — Observation Stay: Payer: Self-pay | Admitting: Cardiovascular Disease

## 2011-11-15 ENCOUNTER — Other Ambulatory Visit: Payer: Self-pay

## 2011-11-15 DIAGNOSIS — Z79899 Other long term (current) drug therapy: Secondary | ICD-10-CM | POA: Diagnosis not present

## 2011-11-15 DIAGNOSIS — R0602 Shortness of breath: Secondary | ICD-10-CM

## 2011-11-15 DIAGNOSIS — E119 Type 2 diabetes mellitus without complications: Secondary | ICD-10-CM | POA: Diagnosis not present

## 2011-11-15 DIAGNOSIS — N189 Chronic kidney disease, unspecified: Secondary | ICD-10-CM | POA: Diagnosis not present

## 2011-11-15 DIAGNOSIS — R0609 Other forms of dyspnea: Secondary | ICD-10-CM

## 2011-11-15 DIAGNOSIS — R0789 Other chest pain: Secondary | ICD-10-CM | POA: Diagnosis not present

## 2011-11-15 DIAGNOSIS — Z8249 Family history of ischemic heart disease and other diseases of the circulatory system: Secondary | ICD-10-CM | POA: Diagnosis not present

## 2011-11-15 DIAGNOSIS — R079 Chest pain, unspecified: Secondary | ICD-10-CM

## 2011-11-15 DIAGNOSIS — R0989 Other specified symptoms and signs involving the circulatory and respiratory systems: Secondary | ICD-10-CM | POA: Diagnosis not present

## 2011-11-15 DIAGNOSIS — I129 Hypertensive chronic kidney disease with stage 1 through stage 4 chronic kidney disease, or unspecified chronic kidney disease: Secondary | ICD-10-CM | POA: Diagnosis not present

## 2011-11-15 DIAGNOSIS — R072 Precordial pain: Secondary | ICD-10-CM

## 2011-11-15 DIAGNOSIS — Z7982 Long term (current) use of aspirin: Secondary | ICD-10-CM | POA: Diagnosis not present

## 2011-11-15 DIAGNOSIS — E785 Hyperlipidemia, unspecified: Secondary | ICD-10-CM | POA: Diagnosis not present

## 2011-11-16 DIAGNOSIS — N189 Chronic kidney disease, unspecified: Secondary | ICD-10-CM | POA: Diagnosis not present

## 2011-11-16 DIAGNOSIS — R072 Precordial pain: Secondary | ICD-10-CM | POA: Diagnosis not present

## 2011-11-16 DIAGNOSIS — E119 Type 2 diabetes mellitus without complications: Secondary | ICD-10-CM | POA: Diagnosis not present

## 2011-11-16 DIAGNOSIS — E785 Hyperlipidemia, unspecified: Secondary | ICD-10-CM | POA: Diagnosis not present

## 2011-11-16 DIAGNOSIS — Z79899 Other long term (current) drug therapy: Secondary | ICD-10-CM | POA: Diagnosis not present

## 2011-11-16 DIAGNOSIS — I129 Hypertensive chronic kidney disease with stage 1 through stage 4 chronic kidney disease, or unspecified chronic kidney disease: Secondary | ICD-10-CM | POA: Diagnosis not present

## 2011-11-16 DIAGNOSIS — R0789 Other chest pain: Secondary | ICD-10-CM | POA: Diagnosis not present

## 2011-11-16 LAB — PROTIME-INR: Prothrombin Time: 12.8 secs (ref 11.5–14.7)

## 2011-11-16 LAB — CBC WITH DIFFERENTIAL/PLATELET
Basophil %: 0.4 %
HCT: 36.1 % — ABNORMAL LOW (ref 40.0–52.0)
HGB: 12.8 g/dL — ABNORMAL LOW (ref 13.0–18.0)
Lymphocyte %: 35.8 %
MCH: 31.8 pg (ref 26.0–34.0)
Monocyte #: 0.5 x10 3/mm (ref 0.2–1.0)
Monocyte %: 10.2 %
Neutrophil #: 2.6 10*3/uL (ref 1.4–6.5)
Neutrophil %: 51.7 %
RDW: 13.1 % (ref 11.5–14.5)
WBC: 5.1 10*3/uL (ref 3.8–10.6)

## 2011-11-16 LAB — BASIC METABOLIC PANEL
Anion Gap: 8 (ref 7–16)
BUN: 15 mg/dL (ref 7–18)
Chloride: 100 mmol/L (ref 98–107)
Creatinine: 1.26 mg/dL (ref 0.60–1.30)
EGFR (African American): >60
EGFR (Non-African Amer.): 59 — ABNORMAL LOW
Glucose: 239 mg/dL — ABNORMAL HIGH (ref 65–99)
Osmolality: 282 (ref 275–301)
Potassium: 4.1 mmol/L (ref 3.5–5.1)
Sodium: 137 mmol/L (ref 136–145)

## 2011-11-25 ENCOUNTER — Encounter: Payer: Self-pay | Admitting: Cardiovascular Disease

## 2011-11-25 ENCOUNTER — Ambulatory Visit (INDEPENDENT_AMBULATORY_CARE_PROVIDER_SITE_OTHER): Payer: Medicare Other | Admitting: Cardiovascular Disease

## 2011-11-25 VITALS — BP 142/60 | HR 102 | Ht 72.0 in | Wt 247.2 lb

## 2011-11-25 DIAGNOSIS — R0789 Other chest pain: Secondary | ICD-10-CM

## 2011-11-25 DIAGNOSIS — N189 Chronic kidney disease, unspecified: Secondary | ICD-10-CM

## 2011-11-25 DIAGNOSIS — R079 Chest pain, unspecified: Secondary | ICD-10-CM

## 2011-11-25 NOTE — Patient Instructions (Addendum)
Labs today  Follow up as needed

## 2011-11-25 NOTE — Assessment & Plan Note (Signed)
This does not seem to be due to coronary artery disease. Cardiac catheterization showed only mild RCA stenosis. I recommend continuing medical therapy. His chest pain is overall better. He continues to complain of increased dyspnea. He can resume his regular activities. If his symptoms persist, he might need further pulmonary evaluation to look for other possibilities. I will check basic metabolic profile today to ensure that there is no contrast-induced nephropathy. Otherwise, he can followup with [place] as needed.

## 2011-11-25 NOTE — Progress Notes (Signed)
HPI  This is a 66 year old male who is here today for a followup visit regarding chest pain and fatigue. His symptoms were worrisome for possible angina with multiple  risk factors for coronary artery disease including prolonged history of diabetes, hypertension, hyperlipidemia, chronic kidney disease and strong family history of coronary artery disease. Thus, I felt that a definitive diagnosis was needed to establish whether he has underlying coronary artery disease. He had an echocardiogram done which showed normal LV systolic function without significant valvular abnormalities. There was mild diastolic dysfunction. He underwent cardiac catheterization via the right radial artery after adequate hydration in the hospital to prevent contrast-induced nephropathy. Cardiac catheterization showed only mild 20% stenosis in the right coronary artery with no other evidence of coronary artery disease. The patient overall feels better he continues to complain of being tired with exertional dyspnea. Occasional chest discomfort but not as much as before. He denies any cough. He is a previous smoker but not aware of any lung disease.  Allergies  Allergen Reactions  . Tetracycline     REACTION: rash     Current Outpatient Prescriptions on File Prior to Visit  Medication Sig Dispense Refill  . albuterol (PROVENTIL HFA;VENTOLIN HFA) 108 (90 BASE) MCG/ACT inhaler Inhale 2 puffs into the lungs every 6 (six) hours as needed for wheezing.  1 Inhaler  0  . aspirin 325 MG tablet Take 325 mg by mouth daily.        [name] Kitchen atenolol (TENORMIN) 100 MG tablet TAKE ONE TABLET BY MOUTH EVERY DAY  90 tablet  3  . b complex vitamins tablet Take 1 tablet by mouth daily.        . cholecalciferol (VITAMIN D) 1000 UNITS tablet Take 1,000 Units by mouth daily.        [name] Kitchen doxazosin (CARDURA) 8 MG tablet TAKE ONE TABLET BY MOUTH EVERY DAY AT BEDTIME  90 tablet  3  . famotidine (PEPCID) 10 MG tablet Take 10 mg by mouth daily.        .  ferrous sulfate 325 (65 FE) MG tablet Take 325 mg by mouth daily with breakfast.      . fluticasone (FLONASE) 50 MCG/ACT nasal spray Place 2 sprays into the nose daily as needed.      [name] Kitchen glucose blood test strip (Onetouch Ultra) test strips-check fasting blood sugar and after breakfast, lunch, and dinner  150 each  3  . indomethacin (INDOCIN) 25 MG capsule Take 1 capsule (25 mg total) by mouth 2 (two) times daily with a meal. Take two tablets every 12 hours as needed for gout pain  60 capsule  2  . insulin NPH (HUMULIN N,NOVOLIN N) 100 UNIT/ML injection Inject 30 Units into the skin at bedtime.      . insulin regular (NOVOLIN R,HUMULIN R) 100 units/mL injection 3x a day (just before each meal) 50-40-60 units (and 100-unit syringes 4/day)      . lisinopril-hydrochlorothiazide (PRINZIDE,ZESTORETIC) 20-12.5 MG per tablet TAKE ONE TABLET BY MOUTH EVERY DAY  90 tablet  3  . niacin (NIASPAN) 1000 MG CR tablet Take 1 tablet (1,000 mg total) by mouth at bedtime. Take an aspirin before taking the niaspan tablet  30 tablet  5  . simvastatin (ZOCOR) 40 MG tablet Take 1 tablet (40 mg total) by mouth at bedtime.  30 tablet  11  . Testosterone (ANDROGEL PUMP) 1.25 GM/ACT (1%) GEL Place 4 Squirts onto the skin daily. Clean and dry shoulders  2 Bottle  3  . venlafaxine XR (EFFEXOR XR) 75 MG 24 hr capsule Take 3 capsules (225 mg total) by mouth daily.  270 capsule  3     Past Medical History  Diagnosis Date  . Depression   . Diabetes mellitus   . Hyperlipidemia   . Hypertension   . Osteoarthritis   . Allergic rhinitis due to pollen   . GERD (gastroesophageal reflux disease)   . Gout   . Diverticulosis   . Personal history of colonic polyps     1996  . Low testosterone   . Iron deficiency   . CKD (chronic kidney disease)      Past Surgical History  Procedure Date  . Cholecystectomy 1980  . Total hip arthroplasty 2009    Dr. Darrelyn Hillock  . Total hip arthroplasty   . Cardiac catheterization 11/2011     [facility]     Family History  Problem Relation Age of Onset  . Diabetes Mother   . Diabetes Father   . Lung cancer Sister     lung  . Heart disease Maternal Grandfather   . COPD Sister   . Heart attack Sister   . Heart disease Sister      History   Social History  . Marital Status: Married    Spouse Name: N/A    Number of Children: N/A  . 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 Smoking status: Former Smoker -- 3.0 packs/day for 35 years    Types: Cigarettes    Quit date: 04/12/2000  . Smokeless tobacco: Never Used  . Alcohol Use: No  . Drug Use: No  . Sexually Active: Not on file   Other Topics Concern  . Not on file   Social History Narrative   No regular exercise     PHYSICAL EXAM   BP 142/60  Pulse 102  Ht 6' (1.829 m)  Wt 247 lb 4 oz (112.152 kg)  BMI 33.53 kg/m2 Constitutional: He is oriented to person, place, and time. He appears well-developed and well-nourished. No distress.  HENT: No nasal discharge.  Head: Normocephalic and atraumatic.  Eyes: Pupils are equal and round. Right eye exhibits no discharge. Left eye exhibits no discharge.  Neck: Normal range of motion. Neck supple. No JVD present. No thyromegaly present.  Cardiovascular: Normal rate, regular rhythm, normal heart sounds and. Exam reveals no gallop and no friction rub. No murmur heard.  Pulmonary/Chest: Effort normal and breath sounds normal. No stridor. No respiratory distress. He has no wheezes. He has no rales. He exhibits no tenderness.  Abdominal: Soft. Bowel sounds are normal. He exhibits no distension. There is no tenderness. There is no rebound and no guarding.  Musculoskeletal: Normal range of motion. He exhibits no edema and no tenderness.  Neurological: He is alert and oriented to person, place, and time. Coordination normal.  Skin: Skin is warm and dry. No rash noted. He is not diaphoretic. No  erythema. No pallor.  Psychiatric: He has a normal mood and affect. His behavior is normal. Judgment and thought content normal.  Right radial pulse is normal with no hematoma.     EKG: Sinus  Tachycardia  WITHIN NORMAL LIMITS   ASSESSMENT AND PLAN

## 2011-11-26 LAB — BASIC METABOLIC PANEL
BUN/Creatinine Ratio: 17 (ref 10–22)
CO2: 24 mmol/L (ref 19–28)
Chloride: 91 mmol/L — ABNORMAL LOW (ref 97–108)
Potassium: 4.8 mmol/L (ref 3.5–5.2)
Sodium: 130 mmol/L — ABNORMAL LOW (ref 134–144)

## 2012-01-26 ENCOUNTER — Other Ambulatory Visit: Payer: Self-pay | Admitting: *Deleted

## 2012-01-26 MED ORDER — NIACIN ER (ANTIHYPERLIPIDEMIC) 1000 MG PO TBCR: 500.0000 mg | EXTENDED_RELEASE_TABLET | Freq: Every day | ORAL | Status: DC

## 2012-01-26 NOTE — Telephone Encounter (Signed)
Dr. Patsy Lager, please review labs from 8/15, ordered by cardiologist.  Pt was to have called you to report that his blood sugar was very high at 465.  No record of phone call in chart, no upcoming appts scheduled.  Please advise.

## 2012-01-27 NOTE — Telephone Encounter (Signed)
Abnormal sugar was from 2 months ago. Okay to wait for Dr. Cyndie Chime return in next few days.

## 2012-01-28 NOTE — Telephone Encounter (Signed)
Please call the patient, he needs to follow-up with Dr. Everardo All about his sugar. BS was >400 at cardiology's office. I also alerted their office, but [name] should know how to contact them himself

## 2012-01-28 NOTE — Telephone Encounter (Signed)
Very good, I am going to forward to Dr. Everardo All who also knows the patient well. [name] has had very difficult to control diabetes for a long time.   I did not see a f/u scheduled with Dr. Everardo All, so I am sure the Endocrine staff can easily help set that up for [name].

## 2012-01-28 NOTE — Telephone Encounter (Signed)
[name], please call patient: [name] today please

## 2012-03-10 ENCOUNTER — Encounter: Payer: Self-pay | Admitting: Family Medicine

## 2012-03-10 ENCOUNTER — Ambulatory Visit (INDEPENDENT_AMBULATORY_CARE_PROVIDER_SITE_OTHER): Payer: Medicare Other | Admitting: Family Medicine

## 2012-03-10 VITALS — BP 162/56 | HR 120 | Temp 97.8°F | Wt 253.0 lb

## 2012-03-10 DIAGNOSIS — R413 Other amnesia: Secondary | ICD-10-CM | POA: Diagnosis not present

## 2012-03-10 DIAGNOSIS — J209 Acute bronchitis, unspecified: Secondary | ICD-10-CM

## 2012-03-10 MED ORDER — AZITHROMYCIN 250 MG PO TABS: ORAL_TABLET | ORAL | Status: DC

## 2012-03-10 NOTE — Progress Notes (Signed)
Nature conservation officer at [facility] [address] [place] [address] Phone: [phone] Fax: [phone]  Date:  03/10/2012   Name:  [name]   DOB:  [birth year]   [name]:  [id number] Gender: male Age: 66 y.o.  PCP:  Dustin Beat, MD  Evaluating MD: Dustin Beat, MD   Chief Complaint: Cough   History of Present Illness:  [name] is a 66 y.o. pleasant patient who presents with the following:   Pleasant gentleman who I know well with significant diabetes mellitus who presents with several day history of intermittent cough, productive of sputum. Primarily pulmonary complaints without significant upper respiratory symptoms. No significant earache or sore throat. No nausea, vomiting, or diarrhea. Patient doesn't have 100+ pack year smoking history although he is quit for some time.  He also has some concerns about his memory, and forgetting things recently but his wife does notice. He otherwise has no impairment.  Patient Active Problem List  Diagnosis  . HYPERLIPIDEMIA  . GOUT  . DEPRESSION  . HYPERTENSION  . ALLERGIC RHINITIS  . GERD  . OSTEOARTHRITIS  . HIP REPLACEMENT, RIGHT, HX OF  . History of smoking  . Family history of MI (myocardial infarction)  . Diabetic neuropathy  . OSA (obstructive sleep apnea)  . Type I (juvenile type) diabetes mellitus with neurological manifestations, uncontrolled  . Cough  . Hemorrhoids with bleeding (L lateral pile especially)  . Obesity (BMI 30-39.9)  . Chest pain    Past Medical History  Diagnosis Date  . Depression   . Diabetes mellitus   . Hyperlipidemia   . Hypertension   . Osteoarthritis   . Allergic rhinitis due to pollen   . GERD (gastroesophageal reflux disease)   . Gout   . Diverticulosis   . Personal history of colonic polyps     1996  . Low testosterone   . Iron deficiency   . CKD (chronic kidney disease)     Past Surgical History  Procedure Date  . Cholecystectomy 1980  . Total  hip arthroplasty 2009    Dr. Darrelyn [name]  . Total hip arthroplasty   . Cardiac catheterization 11/2011    [name]    History  Substance Use Topics  . Smoking status: Former Smoker -- 3.0 packs/day for 35 years    Types: Cigarettes    Quit date: 04/12/2000  . Smokeless tobacco: Never Used  . Alcohol Use: No    Family History  Problem Relation Age of Onset  . Diabetes Mother   . Diabetes Father   . Lung cancer Sister     lung  . Heart disease Maternal Grandfather   . COPD Sister   . Heart attack Sister   . Heart disease Sister     Allergies  Allergen Reactions  . Tetracycline     REACTION: rash    Medication list has been reviewed and updated.  Outpatient Prescriptions Prior to Visit  Medication Sig Dispense Refill  . albuterol (PROVENTIL HFA;VENTOLIN HFA) 108 (90 BASE) MCG/ACT inhaler Inhale 2 puffs into the lungs every 6 (six) hours as needed for wheezing.  1 Inhaler  0  . aspirin 325 MG tablet Take 325 mg by mouth daily.        [name] Kitchen atenolol (TENORMIN) 100 MG tablet TAKE ONE TABLET BY MOUTH EVERY DAY  90 tablet  3  . b complex vitamins tablet Take 1 tablet by mouth daily.        . cholecalciferol (VITAMIN  D) 1000 UNITS tablet Take 1,000 Units by mouth daily.        [name] Kitchen doxazosin (CARDURA) 8 MG tablet TAKE ONE TABLET BY MOUTH EVERY DAY AT BEDTIME  90 tablet  3  . famotidine (PEPCID) 10 MG tablet Take 10 mg by mouth daily.        . ferrous sulfate 325 (65 FE) MG tablet Take 325 mg by mouth daily with breakfast.      . fluticasone (FLONASE) 50 MCG/ACT nasal spray Place 2 sprays into the nose daily as needed.      [name] Kitchen glucose blood test strip (Onetouch Ultra) test strips-check fasting blood sugar and after breakfast, lunch, and dinner  150 each  3  . indomethacin (INDOCIN) 25 MG capsule Take 1 capsule (25 mg total) by mouth 2 (two) times daily with a meal. Take two tablets every 12 hours as needed for gout pain  60 capsule  2  . insulin NPH (HUMULIN N,NOVOLIN N) 100 UNIT/ML injection  Inject 30 Units into the skin at bedtime.      . insulin regular (NOVOLIN R,HUMULIN R) 100 units/mL injection 3x a day (just before each meal) 50-40-60 units (and 100-unit syringes 4/day)      . lisinopril-hydrochlorothiazide (PRINZIDE,ZESTORETIC) 20-12.5 MG per tablet TAKE ONE TABLET BY MOUTH EVERY DAY  90 tablet  3  . niacin (NIASPAN) 1000 MG CR tablet Take 1 tablet (1,000 mg total) by mouth at bedtime. Take an aspirin before taking the niaspan tablet  30 tablet  5  . simvastatin (ZOCOR) 40 MG tablet Take 1 tablet (40 mg total) by mouth at bedtime.  30 tablet  11  . Testosterone (ANDROGEL PUMP) 1.25 GM/ACT (1%) GEL Place 4 Squirts onto the skin daily. Clean and dry shoulders  2 Bottle  3  . venlafaxine XR (EFFEXOR XR) 75 MG 24 hr capsule Take 3 capsules (225 mg total) by mouth daily.  270 capsule  3   Last reviewed on 03/10/2012  3:28 PM by Shon Millet, CMA  Review of Systems:  ROS: GEN: Acute illness details above GI: Tolerating PO intake GU: maintaining adequate hydration and urination Pulm: No SOB Interactive and getting along well at home.  Otherwise, ROS is as per the HPI.   Physical Examination: Filed Vitals:   03/10/12 1523  BP: 162/56  Pulse: 120  Temp: 97.8 F (36.6 C)  TempSrc: Oral  Weight: 253 lb (114.76 kg)  Pulse 80 on MD exam  There is no height on file to calculate BMI. Ideal Body Weight:     GEN: A and O x 3. WDWN. NAD.    ENT: Nose clear, ext NML.  No LAD.  No JVD.  TM's clear. Oropharynx clear.  PULM: Normal WOB, no distress. No crackles, wheezes, rhonchi. CV: RRR, no M/G/R, No rubs, No JVD.   EXT: warm and well-perfused, No c/c/e. PSYCH: Pleasant and conversant. Folstein 30/30  Assessment and Plan:  1. Bronchitis, acute   2. Memory change    Acute bronchitis: discussed plan of care. Given length of symptoms and overall history with 100 pack years, will treat with ABX in this case. Continue with additional supportive care, cough  medications, liquids, sleep, steam / vaporizer.  Memory, reassured him. His Foye Spurling was perfectly normal. It is theoretically possible that he has some subtle changes that only he and his wife can pick up, but overall he is doing quite well.  Orders Today:  No orders of the defined types were placed in  this encounter.    Updated Medication List: (Includes new medications, updates to list, dose adjustments) Meds ordered this encounter  Medications  . azithromycin (ZITHROMAX) 250 MG tablet    Sig: 2 tabs po on day 1, then 1 tab po for 4 days    Dispense:  6 tablet    Refill:  0    Medications Discontinued: There are no discontinued medications.   Dustin Beat, MD

## 2012-05-03 ENCOUNTER — Other Ambulatory Visit: Payer: Self-pay | Admitting: *Deleted

## 2012-05-08 ENCOUNTER — Encounter: Payer: Self-pay | Admitting: Pulmonary Disease

## 2012-05-18 DIAGNOSIS — L708 Other acne: Secondary | ICD-10-CM | POA: Diagnosis not present

## 2012-05-18 DIAGNOSIS — L259 Unspecified contact dermatitis, unspecified cause: Secondary | ICD-10-CM | POA: Diagnosis not present

## 2012-05-19 ENCOUNTER — Ambulatory Visit: Payer: Medicare Other | Admitting: Pulmonary Disease

## 2012-05-19 ENCOUNTER — Other Ambulatory Visit: Payer: Self-pay | Admitting: *Deleted

## 2012-05-19 MED ORDER — FLUTICASONE PROPIONATE 50 MCG/ACT NA SUSP: 2.0000 | Freq: Every day | NASAL | Status: DC | PRN

## 2012-05-21 ENCOUNTER — Other Ambulatory Visit: Payer: Self-pay | Admitting: Family Medicine

## 2012-06-05 ENCOUNTER — Ambulatory Visit: Payer: Medicare Other | Admitting: Pulmonary Disease

## 2012-06-08 ENCOUNTER — Other Ambulatory Visit: Payer: Medicare Other

## 2012-06-09 ENCOUNTER — Other Ambulatory Visit (INDEPENDENT_AMBULATORY_CARE_PROVIDER_SITE_OTHER): Payer: Medicare Other

## 2012-06-09 DIAGNOSIS — E291 Testicular hypofunction: Secondary | ICD-10-CM | POA: Diagnosis not present

## 2012-06-13 ENCOUNTER — Encounter: Payer: Self-pay | Admitting: Family Medicine

## 2012-06-14 ENCOUNTER — Encounter: Payer: Self-pay | Admitting: Family Medicine

## 2012-06-14 ENCOUNTER — Ambulatory Visit (INDEPENDENT_AMBULATORY_CARE_PROVIDER_SITE_OTHER): Payer: Medicare Other | Admitting: Family Medicine

## 2012-06-14 VITALS — BP 120/72 | HR 80 | Temp 97.8°F | Ht 72.0 in | Wt 250.8 lb

## 2012-06-14 DIAGNOSIS — K649 Unspecified hemorrhoids: Secondary | ICD-10-CM | POA: Diagnosis not present

## 2012-06-14 DIAGNOSIS — G4733 Obstructive sleep apnea (adult) (pediatric): Secondary | ICD-10-CM

## 2012-06-14 DIAGNOSIS — Z23 Encounter for immunization: Secondary | ICD-10-CM

## 2012-06-14 DIAGNOSIS — Z87891 Personal history of nicotine dependence: Secondary | ICD-10-CM

## 2012-06-14 DIAGNOSIS — E291 Testicular hypofunction: Secondary | ICD-10-CM

## 2012-06-14 DIAGNOSIS — K648 Other hemorrhoids: Secondary | ICD-10-CM

## 2012-06-14 HISTORY — DX: Testicular hypofunction: E29.1

## 2012-06-14 MED ORDER — TESTOSTERONE 12.5 MG/ACT (1%) TD GEL: 6.0000 | Freq: Every day | TRANSDERMAL | Status: DC

## 2012-06-14 MED ORDER — HYDROCORTISONE ACETATE 25 MG RE SUPP: 25.0000 mg | Freq: Two times a day (BID) | RECTAL | Status: DC | PRN

## 2012-06-14 NOTE — Patient Instructions (Addendum)
Lab check in 1 month  Recheck f/u CPX in 6 months

## 2012-06-14 NOTE — Progress Notes (Signed)
Nature conservation officer at [facility] [address] [place] [address] Phone: [phone] Fax: [phone]  Date:  06/14/2012   Name:  [name]   DOB:  20-Jun-1945   [name]:  [id number] Gender: male Age: 67 y.o.  Primary Physician:  Hannah Beat, MD  Evaluating MD: Gerrit Friends, Student-PA   Chief Complaint: No chief complaint on file.   History of Present Illness:  [name] is a 67 y.o. pleasant patient who presents with the following:  Fatigued and feels like could fall asleep. Wearing CPAP, compliant.   Hemorrhoidal bleeding.  Hard stools, only a small amount.   Not getting short of breath.  Hypogonadal, taking 4 pumps of Androgel 1%.  Lab Results  Component Value Date   TESTOSTERONE 271.51* 06/09/2012   No SE from meds. Tolerating fine.   Patient Active Problem List  Diagnosis  . HYPERLIPIDEMIA  . GOUT  . DEPRESSION  . HYPERTENSION  . ALLERGIC RHINITIS  . GERD  . OSTEOARTHRITIS  . HIP REPLACEMENT, RIGHT, HX OF  . History of smoking  . Family history of MI (myocardial infarction)  . Diabetic neuropathy  . OSA (obstructive sleep apnea)  . Type I (juvenile type) diabetes mellitus with neurological manifestations, uncontrolled  . Hemorrhoids with bleeding (L lateral pile especially)  . Obesity (BMI 30-39.9)    Past Medical History  Diagnosis Date  . Depression   . Diabetes mellitus   . Hyperlipidemia   . Hypertension   . Osteoarthritis   . Allergic rhinitis due to pollen   . GERD (gastroesophageal reflux disease)   . Gout   . Diverticulosis   . Personal history of colonic polyps     1996  . Low testosterone   . Iron deficiency   . CKD (chronic kidney disease)     Past Surgical History  Procedure Laterality Date  . Cholecystectomy  1980  . Total hip arthroplasty  2009    Dr. Darrelyn Hillock  . Total hip arthroplasty    . Cardiac catheterization  11/2011    [facility]    History   Social History  . Marital Status: Married   Spouse Name: N/A    Number of Children: N/A  . 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 Smoking status: Former Smoker -- 3.00 packs/day for 35 years    Types: Cigarettes    Quit date: 04/12/2000  . Smokeless tobacco: Never Used  . Alcohol Use: No  . Drug Use: No  . Sexually Active: Not on file   Other Topics Concern  . Not on file   Social History Narrative   No regular exercise    Family History  Problem Relation Age of Onset  . Diabetes Mother   . Diabetes Father   . Lung cancer Sister     lung  . Heart disease Maternal Grandfather   . COPD Sister   . Heart attack Sister   . Heart disease Sister     Allergies  Allergen Reactions  . Tetracycline     REACTION: rash    Medication list has been reviewed and updated.  Outpatient Prescriptions Prior to Visit  Medication Sig Dispense Refill  . albuterol (PROVENTIL HFA;VENTOLIN HFA) 108 (90 BASE) MCG/ACT inhaler Inhale 2 puffs into the lungs every 6 (six) hours as needed for wheezing.  1 Inhaler  0  . aspirin 325 MG  tablet Take 325 mg by mouth daily.        [name] Kitchen atenolol (TENORMIN) 100 MG tablet TAKE ONE TABLET BY MOUTH EVERY DAY  90 tablet  3  . b complex vitamins tablet Take 1 tablet by mouth daily.        . cholecalciferol (VITAMIN D) 1000 UNITS tablet Take 1,000 Units by mouth daily.        [name] Kitchen doxazosin (CARDURA) 8 MG tablet TAKE ONE TABLET BY MOUTH AT BEDTIME  90 tablet  2  . famotidine (PEPCID) 10 MG tablet Take 10 mg by mouth daily.        . ferrous sulfate 325 (65 FE) MG tablet Take 325 mg by mouth daily with breakfast.      . fluticasone (FLONASE) 50 MCG/ACT nasal spray Place 2 sprays into the nose daily as needed.  16 g  3  . glucose blood test strip (Onetouch Ultra) test strips-check fasting blood sugar and after breakfast, lunch, and dinner  150 each  3  . indomethacin (INDOCIN) 25 MG capsule Take 1 capsule (25 mg total) by mouth 2  (two) times daily with a meal. Take two tablets every 12 hours as needed for gout pain  60 capsule  2  . insulin NPH (HUMULIN N,NOVOLIN N) 100 UNIT/ML injection Inject 30 Units into the skin at bedtime.      . insulin regular (NOVOLIN R,HUMULIN R) 100 units/mL injection 3x a day (just before each meal) 50-40-60 units (and 100-unit syringes 4/day)      . lisinopril-hydrochlorothiazide (PRINZIDE,ZESTORETIC) 20-12.5 MG per tablet TAKE ONE TABLET BY MOUTH EVERY DAY  90 tablet  3  . niacin (NIASPAN) 1000 MG CR tablet Take 1 tablet (1,000 mg total) by mouth at bedtime. Take an aspirin before taking the niaspan tablet  30 tablet  5  . simvastatin (ZOCOR) 40 MG tablet Take 1 tablet (40 mg total) by mouth at bedtime.  30 tablet  11  . Testosterone (ANDROGEL PUMP) 1.25 GM/ACT (1%) GEL Place 4 Squirts onto the skin daily. Clean and dry shoulders  2 Bottle  3  . venlafaxine XR (EFFEXOR XR) 75 MG 24 hr capsule Take 3 capsules (225 mg total) by mouth daily.  270 capsule  3  . azithromycin (ZITHROMAX) 250 MG tablet 2 tabs po on day 1, then 1 tab po for 4 days  6 tablet  0   No facility-administered medications prior to visit.    Review of Systems:  Fatigue, no cp, sob, no doe  Physical Examination: There were no vitals taken for this visit.  Ideal Body Weight:     GEN: WDWN, NAD, Non-toxic, A & O x 3 HEENT: Atraumatic, Normocephalic. Neck supple. No masses, No LAD. Ears and Nose: No external deformity. CV: RRR, No M/G/R. No JVD. No thrill. No extra heart sounds. PULM: CTA B, no wheezes, crackles, rhonchi. No retractions. No resp. distress. No accessory muscle use. EXTR: No c/c/e NEURO Normal gait.  PSYCH: Normally interactive. Conversant. Not depressed or anxious appearing.  Calm demeanor.   Rectal: extensive external hemorrhoids, no thrombosis  Assessment and Plan:  Hypogonadism male - Plan: Testosterone, free, total  Immunization due - Plan: Flu vaccine greater than or equal to 3yo  preservative free IM  OSA (obstructive sleep apnea)  Hemorrhoids with bleeding (L lateral pile especially)  History of smoking  Anusol for hemorrhoids Increase androgel to 6 pumps  Compliant with OSA, BP good, No CAD. No SOB or DOE. If not hypogonadism  as cause of fatigue, I am not sure what.  Orders Today:  Orders Placed This Encounter  Procedures  . Flu vaccine greater than or equal to 3yo preservative free IM  . Testosterone, free, total    Standing Status: Future     Number of Occurrences:      Standing Expiration Date: 06/14/2013    Updated Medication List: (Includes new medications, updates to list, dose adjustments) Meds ordered this encounter  Medications  . hydrocortisone (ANUSOL-HC) 25 MG suppository    Sig: Place 1 suppository (25 mg total) rectally 2 (two) times daily as needed for hemorrhoids.    Dispense:  24 suppository    Refill:  2  . Testosterone (ANDROGEL PUMP) 12.5 MG/ACT (1%) GEL    Sig: Place 6 Squirts onto the skin daily. Clean and dry shoulders    Dispense:  2 Bottle    Refill:  5    Medications Discontinued: Medications Discontinued During This Encounter  Medication Reason  . azithromycin (ZITHROMAX) 250 MG tablet   . Testosterone (ANDROGEL PUMP) 1.25 GM/ACT (1%) GEL Reorder      Signed, Karleen Hampshire T. Copland, MD 06/14/2012 9:03 AM

## 2012-06-21 ENCOUNTER — Ambulatory Visit (INDEPENDENT_AMBULATORY_CARE_PROVIDER_SITE_OTHER): Payer: Medicare Other | Admitting: Pulmonary Disease

## 2012-06-21 ENCOUNTER — Encounter: Payer: Self-pay | Admitting: Pulmonary Disease

## 2012-06-21 VITALS — BP 158/92 | HR 94 | Temp 99.1°F | Ht 72.0 in | Wt 255.0 lb

## 2012-06-21 DIAGNOSIS — G4733 Obstructive sleep apnea (adult) (pediatric): Secondary | ICD-10-CM

## 2012-06-21 NOTE — Progress Notes (Signed)
  Subjective:    Patient ID: [name], male    DOB: 1945/10/18, 67 y.o.   [name]: [id number]  HPI The patient comes in today for followup of his obstructive sleep apnea.  He is wearing CPAP compliantly, and feels that he sleeps very well with the device.  Last year, we try to optimize his pressure, but had difficulties with mask leaks.  He never heard from his medical equipment company to help take care of this or reevaluate his pressure needs despite an order from [place].  As stated above, the patient feels that he is sleeping well, but is having sleepiness when he sits down for any period of time.  His weight is stable from his last visit.   Review of Systems  Constitutional: Negative for fever and unexpected weight change.  HENT: Positive for sneezing and sinus pressure. Negative for ear pain, nosebleeds, congestion, sore throat, rhinorrhea, trouble swallowing, dental problem and postnasal drip.   Eyes: Negative for redness and itching.  Respiratory: Negative for cough, chest tightness, shortness of breath and wheezing.   Cardiovascular: Negative for palpitations and leg swelling.  Gastrointestinal: Positive for nausea ( with burning sensation in arms/groin/feet x 1 day.[name] KitchenMarland KitchenMarland KitchenFlu vaccine 06/14/12). Negative for vomiting.  Genitourinary: Negative for dysuria.  Musculoskeletal: Negative for joint swelling.  Skin: Negative for rash.  Neurological: Negative for headaches.  Hematological: Does not bruise/bleed easily.  Psychiatric/Behavioral: Negative for dysphoric mood. The patient is not nervous/anxious.        Objective:   Physical Exam Overweight male in no acute distress Nose without purulent discharge noted No skin breakdown or pressure necrosis from the CPAP mask Lower extremities without edema, no cyanosis Alert, does not appear to be sleepy, moves all 4 extremities.      Assessment & Plan:

## 2012-06-21 NOTE — Patient Instructions (Addendum)
Will optimize your pressure on your device.  Please call us if this is not getting done by your equipment company. Work on weight loss followup with me in one year if doing well.

## 2012-06-21 NOTE — Assessment & Plan Note (Signed)
The patient is wearing CPAP compliantly, and overall feels that he is much improved.  He is still having some daytime sleepiness with an activity, but we still had not optimized his pressure.  We will try once again to work with his medical equipment company, but if they are not able to get the information, we'll consider moving to a different DME.  I have encouraged the patient to work aggressively on weight loss, and also to keep up with his mask changes and supplies.

## 2012-07-06 DIAGNOSIS — M25569 Pain in unspecified knee: Secondary | ICD-10-CM | POA: Diagnosis not present

## 2012-07-12 DIAGNOSIS — M25569 Pain in unspecified knee: Secondary | ICD-10-CM | POA: Diagnosis not present

## 2012-07-17 ENCOUNTER — Other Ambulatory Visit: Payer: Medicare Other

## 2012-07-18 ENCOUNTER — Other Ambulatory Visit (INDEPENDENT_AMBULATORY_CARE_PROVIDER_SITE_OTHER): Payer: Medicare Other

## 2012-07-18 DIAGNOSIS — E291 Testicular hypofunction: Secondary | ICD-10-CM

## 2012-07-19 ENCOUNTER — Encounter: Payer: Self-pay | Admitting: Family Medicine

## 2012-07-19 LAB — TESTOSTERONE, FREE, TOTAL, SHBG: Sex Hormone Binding: 11 nmol/L — ABNORMAL LOW (ref 13–71)

## 2012-07-22 DIAGNOSIS — M25569 Pain in unspecified knee: Secondary | ICD-10-CM | POA: Diagnosis not present

## 2012-09-15 ENCOUNTER — Other Ambulatory Visit: Payer: Self-pay | Admitting: *Deleted

## 2012-09-15 MED ORDER — SIMVASTATIN 40 MG PO TABS: 40.0000 mg | ORAL_TABLET | Freq: Every day | ORAL | Status: DC

## 2012-10-19 ENCOUNTER — Other Ambulatory Visit: Payer: Self-pay

## 2012-11-04 ENCOUNTER — Emergency Department (HOSPITAL_COMMUNITY)
Admission: EM | Admit: 2012-11-04 | Discharge: 2012-11-04 | Disposition: A | Discharge status: Home / Self Care | Payer: Medicare Other | Attending: Emergency Medicine | Admitting: Emergency Medicine

## 2012-11-04 ENCOUNTER — Emergency Department (HOSPITAL_COMMUNITY): Payer: Medicare Other

## 2012-11-04 ENCOUNTER — Encounter (HOSPITAL_COMMUNITY): Payer: Self-pay | Admitting: *Deleted

## 2012-11-04 DIAGNOSIS — Z794 Long term (current) use of insulin: Secondary | ICD-10-CM | POA: Diagnosis not present

## 2012-11-04 DIAGNOSIS — I129 Hypertensive chronic kidney disease with stage 1 through stage 4 chronic kidney disease, or unspecified chronic kidney disease: Secondary | ICD-10-CM | POA: Insufficient documentation

## 2012-11-04 DIAGNOSIS — Z96649 Presence of unspecified artificial hip joint: Secondary | ICD-10-CM | POA: Insufficient documentation

## 2012-11-04 DIAGNOSIS — R42 Dizziness and giddiness: Secondary | ICD-10-CM | POA: Diagnosis not present

## 2012-11-04 DIAGNOSIS — F329 Major depressive disorder, single episode, unspecified: Secondary | ICD-10-CM | POA: Diagnosis not present

## 2012-11-04 DIAGNOSIS — Z7982 Long term (current) use of aspirin: Secondary | ICD-10-CM | POA: Diagnosis not present

## 2012-11-04 DIAGNOSIS — E785 Hyperlipidemia, unspecified: Secondary | ICD-10-CM | POA: Insufficient documentation

## 2012-11-04 DIAGNOSIS — N189 Chronic kidney disease, unspecified: Secondary | ICD-10-CM | POA: Insufficient documentation

## 2012-11-04 DIAGNOSIS — Z9861 Coronary angioplasty status: Secondary | ICD-10-CM | POA: Diagnosis not present

## 2012-11-04 DIAGNOSIS — Z9889 Other specified postprocedural states: Secondary | ICD-10-CM | POA: Insufficient documentation

## 2012-11-04 DIAGNOSIS — E119 Type 2 diabetes mellitus without complications: Secondary | ICD-10-CM | POA: Diagnosis not present

## 2012-11-04 DIAGNOSIS — R51 Headache: Secondary | ICD-10-CM | POA: Diagnosis not present

## 2012-11-04 DIAGNOSIS — Z8639 Personal history of other endocrine, nutritional and metabolic disease: Secondary | ICD-10-CM | POA: Insufficient documentation

## 2012-11-04 DIAGNOSIS — Z860100 Personal history of colon polyps, unspecified: Secondary | ICD-10-CM | POA: Insufficient documentation

## 2012-11-04 DIAGNOSIS — Z8719 Personal history of other diseases of the digestive system: Secondary | ICD-10-CM | POA: Insufficient documentation

## 2012-11-04 DIAGNOSIS — Z87891 Personal history of nicotine dependence: Secondary | ICD-10-CM | POA: Insufficient documentation

## 2012-11-04 DIAGNOSIS — Z862 Personal history of diseases of the blood and blood-forming organs and certain disorders involving the immune mechanism: Secondary | ICD-10-CM | POA: Insufficient documentation

## 2012-11-04 DIAGNOSIS — M1990 Unspecified osteoarthritis, unspecified site: Secondary | ICD-10-CM | POA: Diagnosis not present

## 2012-11-04 DIAGNOSIS — R0789 Other chest pain: Secondary | ICD-10-CM | POA: Diagnosis not present

## 2012-11-04 DIAGNOSIS — R079 Chest pain, unspecified: Secondary | ICD-10-CM | POA: Diagnosis not present

## 2012-11-04 DIAGNOSIS — Z9109 Other allergy status, other than to drugs and biological substances: Secondary | ICD-10-CM | POA: Diagnosis not present

## 2012-11-04 DIAGNOSIS — Z79899 Other long term (current) drug therapy: Secondary | ICD-10-CM | POA: Diagnosis not present

## 2012-11-04 DIAGNOSIS — F3289 Other specified depressive episodes: Secondary | ICD-10-CM | POA: Insufficient documentation

## 2012-11-04 LAB — CBC WITH DIFFERENTIAL/PLATELET
Basophils Absolute: 0 10*3/uL (ref 0.0–0.1)
Basophils Relative: 0 % (ref 0–1)
Eosinophils Absolute: 0.1 10*3/uL (ref 0.0–0.7)
Eosinophils Relative: 1 % (ref 0–5)
HCT: 35.1 % — ABNORMAL LOW (ref 39.0–52.0)
MCH: 31.3 pg (ref 26.0–34.0)
MCHC: 36.2 g/dL — ABNORMAL HIGH (ref 30.0–36.0)
MCV: 86.5 fL (ref 78.0–100.0)
Monocytes Absolute: 0.6 10*3/uL (ref 0.1–1.0)
Platelets: 173 10*3/uL (ref 150–400)
RDW: 12.2 % (ref 11.5–15.5)

## 2012-11-04 LAB — BASIC METABOLIC PANEL
CO2: 25 mEq/L (ref 19–32)
Calcium: 8.8 mg/dL (ref 8.4–10.5)
Creatinine, Ser: 1.43 mg/dL — ABNORMAL HIGH (ref 0.50–1.35)
GFR calc Af Amer: 57 mL/min — ABNORMAL LOW (ref >90)
GFR calc non Af Amer: 50 mL/min — ABNORMAL LOW (ref >90)
Sodium: 127 mEq/L — ABNORMAL LOW (ref 135–145)

## 2012-11-04 LAB — POCT I-STAT TROPONIN I

## 2012-11-04 LAB — D-DIMER, QUANTITATIVE: D-Dimer, Quant: 0.34 ug/mL-FEU (ref 0.00–0.48)

## 2012-11-04 MED ORDER — DIPHENHYDRAMINE HCL 50 MG/ML IJ SOLN
25.0000 mg | Freq: Once | INTRAMUSCULAR | Status: AC
  Administered 2012-11-04: 25 mg via INTRAVENOUS
  Filled 2012-11-04: qty 1

## 2012-11-04 MED ORDER — FENTANYL CITRATE 0.05 MG/ML IJ SOLN
100.0000 ug | Freq: Once | INTRAMUSCULAR | Status: AC
  Administered 2012-11-04: 100 ug via INTRAVENOUS
  Filled 2012-11-04: qty 2

## 2012-11-04 MED ORDER — METOCLOPRAMIDE HCL 5 MG/ML IJ SOLN
10.0000 mg | Freq: Once | INTRAMUSCULAR | Status: AC
  Administered 2012-11-04: 10 mg via INTRAVENOUS
  Filled 2012-11-04: qty 2

## 2012-11-04 NOTE — ED Notes (Signed)
Pt reports acute l/side  chest pain, headache, shortness of breath. Denies short of breath at present, ambulated without difficulty. Reports hx of mild chest pain

## 2012-11-04 NOTE — ED Provider Notes (Signed)
CSN: [id number]     Arrival date & time 11/04/12  1451 History     First MD Initiated Contact with Patient 11/04/12 1503     Chief Complaint  Patient presents with  . Chest Pain    45 minute hx of midsternal chest pain  . Headache    frontal headache   (Consider location/radiation/quality/duration/timing/severity/associated sxs/prior Treatment) HPI This 67 year old male has a history of diabetes, chronic renal insufficiency, single-vessel nonobstructive coronary artery disease with a 20% right coronary artery lesion approximately year ago at cardiac catheterization, he has chronic generalized weakness chronic shortness of breath which been stable recently, today he was walking outside feeling fine within the last hour prior to arrival when he gets over the course of one to 2 minutes several episodes of a sudden sharp stabbing well localized nonradiating pleuritic left anterior chest pain with some lightheadedness but no fever no cough no constant pain no pain more than a few seconds at a time and no chest pain now, he is no shortness of breath no nausea no sweats no abdominal pain no back pain no neck pain no radiation to his arms, he then however developed a separate sudden onset occipital headache and right-sided headache now frontal headache which was relatively severe at onset and improving but persistent over the last 45 minutes, he had no associated symptoms such as any change in speech vision swallowing or understanding and no focal or lateralizing weakness numbness or incoordination. There is no treatment prior to arrival. He no longer has chest pain now. His headache is moderate now. He does not have any recent history of immobilization. At baseline he walks independently although, and uses a cane for safety. Past Medical History  Diagnosis Date  . Depression   . Diabetes mellitus   . Hyperlipidemia   . Hypertension   . Osteoarthritis   . Allergic rhinitis due to pollen   . GERD  (gastroesophageal reflux disease)   . Gout   . Diverticulosis   . Personal history of colonic polyps     1996  . Low testosterone   . Iron deficiency   . CKD (chronic kidney disease)   . Hypogonadism male 06/14/2012   Past Surgical History  Procedure Laterality Date  . Cholecystectomy  1980  . Total hip arthroplasty  2009    Dr. Darrelyn Hillock  . Total hip arthroplasty    . Cardiac catheterization  11/2011    [facility]   Family History  Problem Relation Age of Onset  . Diabetes Mother   . Diabetes Father   . Lung cancer Sister     lung  . Heart disease Maternal Grandfather   . COPD Sister   . Heart attack Sister   . Heart disease Sister    History  Substance Use Topics  . Smoking status: Former Smoker -- 3.00 packs/day for 35 years    Types: Cigarettes    Quit date: 04/12/2000  . Smokeless tobacco: Never Used  . Alcohol Use: No    Review of Systems 10 Systems reviewed and are negative for acute change except as noted in the HPI. Allergies  Tetracycline  Home Medications   Current Outpatient Rx  Name  Route  Sig  Dispense  Refill  . aspirin 81 MG chewable tablet   Oral   Chew 81 mg by mouth once. X 5 tabs         . atenolol (TENORMIN) 100 MG tablet      TAKE ONE TABLET  BY MOUTH EVERY DAY   90 tablet   3   . b complex vitamins tablet   Oral   Take 1 tablet by mouth daily.           . cholecalciferol (VITAMIN D) 1000 UNITS tablet   Oral   Take 1,000 Units by mouth daily.           [name] Kitchen doxazosin (CARDURA) 8 MG tablet      TAKE ONE TABLET BY MOUTH AT BEDTIME   90 tablet   2   . famotidine (PEPCID) 10 MG tablet   Oral   Take 10 mg by mouth daily.           . fluticasone (FLONASE) 50 MCG/ACT nasal spray   Nasal   Place 2 sprays into the nose daily as needed.   16 g   3   . glucose blood test strip      (Onetouch Ultra) test strips-check fasting blood sugar and after breakfast, lunch, and dinner   150 each   3   . hydrocortisone (ANUSOL-HC) 25 MG  suppository   Rectal   Place 1 suppository (25 mg total) rectally 2 (two) times daily as needed for hemorrhoids.   24 suppository   2   . indomethacin (INDOCIN) 25 MG capsule   Oral   Take 1 capsule (25 mg total) by mouth 2 (two) times daily with a meal. Take two tablets every 12 hours as needed for gout pain   60 capsule   2   . EXPIRED: insulin NPH (HUMULIN N,NOVOLIN N) 100 UNIT/ML injection   Subcutaneous   Inject 30 Units into the skin at bedtime.         . insulin regular (NOVOLIN R,HUMULIN R) 100 units/mL injection   Subcutaneous   Inject 30 Units into the skin 3 (three) times daily before meals. 3x a day (just before each meal)         . lisinopril-hydrochlorothiazide (PRINZIDE,ZESTORETIC) 20-12.5 MG per tablet      TAKE ONE TABLET BY MOUTH EVERY DAY   90 tablet   3   . niacin (NIASPAN) 1000 MG CR tablet   Oral   Take 1 tablet (1,000 mg total) by mouth at bedtime. Take an aspirin before taking the niaspan tablet   30 tablet   5   . simvastatin (ZOCOR) 40 MG tablet   Oral   Take 1 tablet (40 mg total) by mouth at bedtime.   30 tablet   5   . Testosterone (ANDROGEL PUMP) 12.5 MG/ACT (1%) GEL   Transdermal   Place 6 Squirts onto the skin daily. Clean and dry shoulders   2 Bottle   5   . EXPIRED: venlafaxine XR (EFFEXOR XR) 75 MG 24 hr capsule   Oral   Take 3 capsules (225 mg total) by mouth daily.   270 capsule   3   . EXPIRED: albuterol (PROVENTIL HFA;VENTOLIN HFA) 108 (90 BASE) MCG/ACT inhaler   Inhalation   Inhale 2 puffs into the lungs every 6 (six) hours as needed for wheezing.   1 Inhaler   0    BP 107/55  Pulse 64  Temp(Src) 97.8 F (36.6 C) (Oral)  Resp 20  SpO2 96% Physical Exam  Nursing note and vitals reviewed. Constitutional:  Awake, alert, nontoxic appearance with baseline speech for patient.  HENT:  Head: Atraumatic.  Mouth/Throat: No oropharyngeal exudate.  Eyes: EOM are normal. Pupils are equal, round, and reactive to  light. Right eye exhibits no discharge. Left eye exhibits no discharge.  Neck: Neck supple.  Cardiovascular: Normal rate and regular rhythm.   No murmur heard. Pulmonary/Chest: Effort normal and breath sounds normal. No stridor. No respiratory distress. He has no wheezes. He has no rales. He exhibits no tenderness.  Abdominal: Soft. Bowel sounds are normal. He exhibits no mass. There is no tenderness. There is no rebound.  Musculoskeletal: He exhibits no edema and no tenderness.  Baseline ROM, moves extremities with no obvious new focal weakness.  Lymphadenopathy:    He has no cervical adenopathy.  Neurological: He is alert.  Awake, alert, cooperative and aware of situation; motor strength bilaterally; sensation normal to light touch bilaterally; peripheral visual fields full to confrontation; no facial asymmetry; tongue midline; major cranial nerves appear intact; no pronator drift, normal finger to nose bilaterally  Skin: No rash noted.  Psychiatric: He has a normal mood and affect.    ED Course  ECG: Sinus rhythm, ventricular rate 77, normal axis, normal intervals, no acute ischemic changes noted, impression normal ECG, no significant change noted compared with July 2009   Patient / Family / Caregiver understand and agree with initial ED impression and plan with expectations set for ED visit; check head CT, D-dimer, and serial troponins. Patient / Family / Caregiver informed of clinical course, understand medical decision-making process, and agree with plan.  Procedures (including critical care time)  Labs Reviewed  CBC WITH DIFFERENTIAL - Abnormal; Notable for the following:    RBC 4.06 (*)    Hemoglobin 12.7 (*)    HCT 35.1 (*)    MCHC 36.2 (*)    All other components within normal limits  BASIC METABOLIC PANEL - Abnormal; Notable for the following:    Sodium 127 (*)    Chloride 91 (*)    Glucose, Bld 341 (*)    BUN 25 (*)    Creatinine, Ser 1.43 (*)    GFR calc non Af Amer  50 (*)    GFR calc Af Amer 57 (*)    All other components within normal limits  D-DIMER, QUANTITATIVE  POCT I-STAT TROPONIN I  POCT I-STAT TROPONIN I  POCT I-STAT TROPONIN I   Dg Chest 2 View  11/04/2012   *RADIOLOGY REPORT*  Clinical Data: Left-sided chest pain.  Headache.  CHEST - 2 VIEW  Comparison: 02/04/2011  Findings: Artifact overlies the chest.  Heart size is normal. Mediastinal shadows are normal.  The lungs are clear.  No effusions.  Ordinary mild degenerative changes effect the spine.  IMPRESSION: No active cardiopulmonary disease.   Original Report Authenticated By: Paulina Fusi, M.D.   Ct Head Wo Contrast  11/04/2012   *RADIOLOGY REPORT*  Clinical Data: Headache.  Chest pain.  Dizziness.  CT HEAD WITHOUT CONTRAST  Technique:  Contiguous axial images were obtained from the base of the skull through the vertex without contrast.  Comparison: None.  Findings: The brain shows mild generalized atrophy.  There is no sign of old or acute infarction, mass lesion, hemorrhage, hydrocephalus or extra-axial collection.  Sinuses are clear.  No calvarial abnormality.  IMPRESSION: Age related atrophy.  No focal or acute finding.   Original Report Authenticated By: Paulina Fusi, M.D.   1. Headache   2. Chest pain     MDM  I doubt any other [facility] precluding discharge at this time including, but not necessarily limited to the following:SAH, CVA, ACS, PE.  Hurman Horn, MD 11/05/12 1155

## 2012-11-23 ENCOUNTER — Encounter: Payer: Self-pay | Admitting: Family Medicine

## 2012-11-23 ENCOUNTER — Ambulatory Visit (INDEPENDENT_AMBULATORY_CARE_PROVIDER_SITE_OTHER): Payer: Medicare Other | Admitting: Family Medicine

## 2012-11-23 VITALS — BP 118/58 | HR 84 | Temp 97.9°F | Wt 242.8 lb

## 2012-11-23 DIAGNOSIS — N419 Inflammatory disease of prostate, unspecified: Secondary | ICD-10-CM | POA: Diagnosis not present

## 2012-11-23 DIAGNOSIS — R300 Dysuria: Secondary | ICD-10-CM

## 2012-11-23 DIAGNOSIS — N471 Phimosis: Secondary | ICD-10-CM | POA: Diagnosis not present

## 2012-11-23 DIAGNOSIS — IMO0002 Reserved for concepts with insufficient information to code with codable children: Secondary | ICD-10-CM | POA: Diagnosis not present

## 2012-11-23 DIAGNOSIS — Q559 Congenital malformation of male genital organ, unspecified: Secondary | ICD-10-CM

## 2012-11-23 DIAGNOSIS — N478 Other disorders of prepuce: Secondary | ICD-10-CM | POA: Diagnosis not present

## 2012-11-23 LAB — POCT URINALYSIS DIPSTICK
Glucose, UA: NEGATIVE
Ketones, UA: NEGATIVE
Leukocytes, UA: NEGATIVE
Protein, UA: 30

## 2012-11-23 MED ORDER — SULFAMETHOXAZOLE-TMP DS 800-160 MG PO TABS: 1.0000 | ORAL_TABLET | Freq: Two times a day (BID) | ORAL | Status: DC

## 2012-11-23 MED ORDER — NYSTATIN 100000 UNIT/GM EX CREA: TOPICAL_CREAM | Freq: Two times a day (BID) | CUTANEOUS | Status: DC

## 2012-11-23 NOTE — Patient Instructions (Addendum)
Use the cream twice a day on your shaft.  Start the antibiotics today.  Call about getting a followup sugar appointment with Dr. Patsy Lager or the endocrine clinic.  If you have a fever or your symptoms not improve, then notify the clinic.  Take care.

## 2012-11-23 NOTE — Progress Notes (Signed)
2 weeks ago had a fever up to 103.  Urinary frequency.  "I just didn't feel good."  Fever resolved in meantime.   Still with 12-14 voids per day.  Shaft if irritated, desitin didn't help.  Urine pressure is decreased chronically.  Delay initiation of urination.  Starting and stopping stream. No burning with urination.  No NAV, no abd pain.  Recently with sugar with sugar ~200 in AM.  "I'm not watching what I eat."  Seeing endo about DM2 but not recently.  Also with scrotal lesion noted.  Meds, vitals, and allergies reviewed.   ROS: See HPI.  Otherwise, noncontributory.  nad ncat Mmm rrr ctab abd soft No cva pain Prostate tender and soft Fungal elements on glans noted, foreskin can be retracted Testicles wnl but disrupted skin on R side of scrotum w/o fluctuant mass noted

## 2012-11-24 ENCOUNTER — Telehealth: Payer: Self-pay | Admitting: *Deleted

## 2012-11-24 DIAGNOSIS — Q559 Congenital malformation of male genital organ, unspecified: Secondary | ICD-10-CM | POA: Insufficient documentation

## 2012-11-24 DIAGNOSIS — N471 Phimosis: Secondary | ICD-10-CM | POA: Insufficient documentation

## 2012-11-24 DIAGNOSIS — N419 Inflammatory disease of prostate, unspecified: Secondary | ICD-10-CM | POA: Insufficient documentation

## 2012-11-24 NOTE — Telephone Encounter (Signed)
Pharmacy sent request a substitution med for Androgel 1%  12.5 mg . It is no longer made by manufacturer. There is an Androgel pump 1.62% 20.5mg   and Androgel 1% 20.5 mg packets available? Are either of these appropriate? If so, needs new rx.

## 2012-11-24 NOTE — Assessment & Plan Note (Signed)
Superficial and limited, should resolve with septra. Okay for outpatient f/u.

## 2012-11-24 NOTE — Assessment & Plan Note (Signed)
Presumed, nontoxic, septra and f/u with PCP.

## 2012-11-24 NOTE — Assessment & Plan Note (Signed)
Can retract, use nystatin.  D/w pt about ER if unable to retract

## 2012-11-26 NOTE — Telephone Encounter (Signed)
i will have to call pharmacy to clarify.

## 2012-11-27 ENCOUNTER — Other Ambulatory Visit: Payer: Self-pay | Admitting: Family Medicine

## 2012-11-27 MED ORDER — TESTOSTERONE 20.25 MG/1.25GM (1.62%) TD GEL: 4.0000 "application " | Freq: Every day | TRANSDERMAL | Status: DC

## 2012-11-27 NOTE — Telephone Encounter (Signed)
converted

## 2012-12-05 ENCOUNTER — Other Ambulatory Visit: Payer: Self-pay | Admitting: Family Medicine

## 2012-12-05 DIAGNOSIS — Z125 Encounter for screening for malignant neoplasm of prostate: Secondary | ICD-10-CM

## 2012-12-05 DIAGNOSIS — E785 Hyperlipidemia, unspecified: Secondary | ICD-10-CM

## 2012-12-05 DIAGNOSIS — R5381 Other malaise: Secondary | ICD-10-CM

## 2012-12-05 DIAGNOSIS — Z79899 Other long term (current) drug therapy: Secondary | ICD-10-CM

## 2012-12-05 DIAGNOSIS — E1059 Type 1 diabetes mellitus with other circulatory complications: Secondary | ICD-10-CM

## 2012-12-05 DIAGNOSIS — N478 Other disorders of prepuce: Secondary | ICD-10-CM

## 2012-12-12 ENCOUNTER — Other Ambulatory Visit (INDEPENDENT_AMBULATORY_CARE_PROVIDER_SITE_OTHER): Payer: Medicare Other

## 2012-12-12 DIAGNOSIS — Z125 Encounter for screening for malignant neoplasm of prostate: Secondary | ICD-10-CM

## 2012-12-12 DIAGNOSIS — Z79899 Other long term (current) drug therapy: Secondary | ICD-10-CM

## 2012-12-12 DIAGNOSIS — N471 Phimosis: Secondary | ICD-10-CM

## 2012-12-12 DIAGNOSIS — N478 Other disorders of prepuce: Secondary | ICD-10-CM | POA: Diagnosis not present

## 2012-12-12 DIAGNOSIS — R5381 Other malaise: Secondary | ICD-10-CM

## 2012-12-12 DIAGNOSIS — E1059 Type 1 diabetes mellitus with other circulatory complications: Secondary | ICD-10-CM | POA: Diagnosis not present

## 2012-12-12 DIAGNOSIS — E785 Hyperlipidemia, unspecified: Secondary | ICD-10-CM | POA: Diagnosis not present

## 2012-12-12 LAB — LIPID PANEL
Cholesterol: 223 mg/dL — ABNORMAL HIGH (ref 0–200)
HDL: 38.9 mg/dL — ABNORMAL LOW (ref >39.00)
Total CHOL/HDL Ratio: 6
Triglycerides: 1001 mg/dL — ABNORMAL HIGH (ref 0.0–149.0)
VLDL: 200.2 mg/dL — ABNORMAL HIGH (ref 0.0–40.0)

## 2012-12-12 LAB — BASIC METABOLIC PANEL
BUN: 23 mg/dL (ref 6–23)
CO2: 24 mEq/L (ref 19–32)
Chloride: 96 mEq/L (ref 96–112)
Creatinine, Ser: 1.7 mg/dL — ABNORMAL HIGH (ref 0.4–1.5)
Glucose, Bld: 279 mg/dL — ABNORMAL HIGH (ref 70–99)
Potassium: 4.1 mEq/L (ref 3.5–5.1)

## 2012-12-12 LAB — CBC WITH DIFFERENTIAL/PLATELET
Eosinophils Absolute: 0.2 10*3/uL (ref 0.0–0.7)
Eosinophils Relative: 2.1 % (ref 0.0–5.0)
HCT: 37.2 % — ABNORMAL LOW (ref 39.0–52.0)
Lymphs Abs: 2.4 10*3/uL (ref 0.7–4.0)
MCHC: 34.5 g/dL (ref 30.0–36.0)
MCV: 90.3 fl (ref 78.0–100.0)
Monocytes Absolute: 0.7 10*3/uL (ref 0.1–1.0)
Platelets: 196 10*3/uL (ref 150.0–400.0)
RDW: 13.4 % (ref 11.5–14.6)
WBC: 7.5 10*3/uL (ref 4.5–10.5)

## 2012-12-12 LAB — HEPATIC FUNCTION PANEL
ALT: 19 U/L (ref 0–53)
Total Bilirubin: 0.5 mg/dL (ref 0.3–1.2)
Total Protein: 6.6 g/dL (ref 6.0–8.3)

## 2012-12-12 LAB — PSA: PSA: 0.87 ng/mL (ref 0.10–4.00)

## 2012-12-13 LAB — TESTOSTERONE, FREE, TOTAL, SHBG
Sex Hormone Binding: 12 nmol/L — ABNORMAL LOW (ref 13–71)
Testosterone-% Free: 3 % — ABNORMAL HIGH (ref 1.6–2.9)
Testosterone: 195 ng/dL — ABNORMAL LOW (ref 300–890)

## 2012-12-18 ENCOUNTER — Encounter: Payer: Self-pay | Admitting: Family Medicine

## 2012-12-18 ENCOUNTER — Ambulatory Visit (INDEPENDENT_AMBULATORY_CARE_PROVIDER_SITE_OTHER): Payer: Medicare Other | Admitting: Family Medicine

## 2012-12-18 VITALS — BP 140/76 | HR 73 | Temp 97.6°F | Ht 72.0 in | Wt 240.5 lb

## 2012-12-18 DIAGNOSIS — E1149 Type 2 diabetes mellitus with other diabetic neurological complication: Secondary | ICD-10-CM

## 2012-12-18 DIAGNOSIS — Z23 Encounter for immunization: Secondary | ICD-10-CM

## 2012-12-18 DIAGNOSIS — E1140 Type 2 diabetes mellitus with diabetic neuropathy, unspecified: Secondary | ICD-10-CM

## 2012-12-18 DIAGNOSIS — Z0000 Encounter for general adult medical examination without abnormal findings: Secondary | ICD-10-CM

## 2012-12-18 DIAGNOSIS — E1142 Type 2 diabetes mellitus with diabetic polyneuropathy: Secondary | ICD-10-CM

## 2012-12-18 DIAGNOSIS — I10 Essential (primary) hypertension: Secondary | ICD-10-CM

## 2012-12-18 DIAGNOSIS — E1049 Type 1 diabetes mellitus with other diabetic neurological complication: Secondary | ICD-10-CM | POA: Diagnosis not present

## 2012-12-18 DIAGNOSIS — E291 Testicular hypofunction: Secondary | ICD-10-CM

## 2012-12-18 DIAGNOSIS — E785 Hyperlipidemia, unspecified: Secondary | ICD-10-CM

## 2012-12-18 DIAGNOSIS — E669 Obesity, unspecified: Secondary | ICD-10-CM

## 2012-12-18 MED ORDER — DOXAZOSIN MESYLATE 8 MG PO TABS: 4.0000 mg | ORAL_TABLET | Freq: Every day | ORAL | Status: DC

## 2012-12-18 MED ORDER — LISINOPRIL-HYDROCHLOROTHIAZIDE 20-12.5 MG PO TABS: 1.0000 | ORAL_TABLET | Freq: Every day | ORAL | Status: DC

## 2012-12-18 MED ORDER — ATENOLOL 100 MG PO TABS: 100.0000 mg | ORAL_TABLET | Freq: Every day | ORAL | Status: DC

## 2012-12-18 MED ORDER — NIACIN ER (ANTIHYPERLIPIDEMIC) 1000 MG PO TBCR: 2000.0000 mg | EXTENDED_RELEASE_TABLET | Freq: Every day | ORAL | Status: DC

## 2012-12-18 NOTE — Patient Instructions (Addendum)
REFERRAL: GO THE THE FRONT ROOM AT THE ENTRANCE OF OUR CLINIC, NEAR CHECK IN. ASK FOR MARION. SHE WILL HELP YOU SET UP YOUR REFERRAL. DATE: TIME:  

## 2012-12-18 NOTE — Progress Notes (Signed)
Nature conservation officer at [facility] [address] [place] [address] Phone: [phone] Fax: [phone]  Date:  12/18/2012   Name:  [name]   DOB:  15-Jan-1946   [name]:  [id number] Gender: male Age: 67 y.o.  Primary Physician:  Hannah Beat, MD  Evaluating MD: Hannah Beat, MD   Chief Complaint: Medicare Wellness   History of Present Illness:  [name] is a 67 y.o. pleasant patient who presents with the following:  Medicare Wellness and fu multiple chronic conditions:  Preventative Health Maintenance Visit:  Health Maintenance Summary Reviewed and updated, unless pt declines services.  Tobacco History Reviewed. Alcohol: No concerns, no excessive use Exercise Habits: none STD concerns: no risk or activity to increase risk Drug Use: None Encouraged self-testicular check  Health Maintenance  Topic Date Due  . Zostavax  11/06/2005  . Pneumococcal Polysaccharide Vaccine Age 59 And Over  11/07/2010  . Foot Exam  09/16/2011  . Ophthalmology Exam  11/04/2012  . Hemoglobin A1c  06/11/2013  . Influenza Vaccine  11/10/2013  . Urine Microalbumin  12/12/2013  . Tetanus/tdap  04/12/2017  . Colonoscopy  09/15/2020    Labs reviewed with the patient.  Results for orders placed in visit on 12/12/12  LIPID PANEL      Result Value Range   Cholesterol 223 (*) 0 - 200 mg/dL   Triglycerides 8657.8 (*) 0.0 - 149.0 mg/dL   HDL 46.96 (*) >29.52 mg/dL   VLDL 841.3 (*) 0.0 - 40.0 mg/dL   Total CHOL/HDL Ratio 6    HEMOGLOBIN K4M      Result Value Range   Hemoglobin A1C 12.9 (*) 4.6 - 6.5 %  MICROALBUMIN / CREATININE URINE RATIO      Result Value Range   Microalb, Ur 10.6 (*) 0.0 - 1.9 mg/dL   Creatinine,U 010.2     Microalb Creat Ratio 4.0  0.0 - 30.0 mg/g  BASIC METABOLIC PANEL      Result Value Range   Sodium 129 (*) 135 - 145 mEq/L   Potassium 4.1  3.5 - 5.1 mEq/L   Chloride 96  96 - 112 mEq/L   CO2 24  19 - 32 mEq/L   Glucose, Bld 279 (*) 70 -  99 mg/dL   BUN 23  6 - 23 mg/dL   Creatinine, Ser 1.7 (*) 0.4 - 1.5 mg/dL   Calcium 8.9  8.4 - 72.5 mg/dL   GFR 36.64 (*) >40.34 mL/min  CBC WITH DIFFERENTIAL      Result Value Range   WBC 7.5  4.5 - 10.5 K/uL   RBC 4.12 (*) 4.22 - 5.81 Mil/uL   Hemoglobin 12.8 (*) 13.0 - 17.0 g/dL   HCT 74.2 (*) 59.5 - 63.8 %   MCV 90.3  78.0 - 100.0 fl   MCHC 34.5  30.0 - 36.0 g/dL   RDW 75.6  43.3 - 29.5 %   Platelets 196.0  150.0 - 400.0 K/uL   Neutrophils Relative % 56.6  43.0 - 77.0 %   Lymphocytes Relative 31.4  12.0 - 46.0 %   Monocytes Relative 9.4  3.0 - 12.0 %   Eosinophils Relative 2.1  0.0 - 5.0 %   Basophils Relative 0.5  0.0 - 3.0 %   Neutro Abs 4.3  1.4 - 7.7 K/uL   Lymphs Abs 2.4  0.7 - 4.0 K/uL   Monocytes Absolute 0.7  0.1 - 1.0 K/uL   Eosinophils Absolute 0.2  0.0 - 0.7 K/uL  Basophils Absolute 0.0  0.0 - 0.1 K/uL  HEPATIC FUNCTION PANEL      Result Value Range   Total Bilirubin 0.5  0.3 - 1.2 mg/dL   Bilirubin, Direct 0.1  0.0 - 0.3 mg/dL   Alkaline Phosphatase 75  39 - 117 U/L   AST 17  0 - 37 U/L   ALT 19  0 - 53 U/L   Total Protein 6.6  6.0 - 8.3 g/dL   Albumin 3.7  3.5 - 5.2 g/dL  PSA      Result Value Range   PSA 0.87  0.10 - 4.00 ng/mL  TESTOSTERONE, FREE, TOTAL      Result Value Range   Testosterone 195 (*) 300 - 890 ng/dL   Sex Hormone Binding 12 (*) 13 - 71 nmol/L   Testosterone, Free 58.3  47.0 - 244.0 pg/mL   Testosterone-% Freee. 3.0 (*) 1.6 - 2.9 %  LDL CHOLESTEROL, DIRECT      Result Value Range   Direct LDL 54.7     Diabetes Mellitus: Tolerating Medications: yes Compliance with diet: very poor Exercise: none Avg blood sugars at home: "high", 300 + Foot problems: none Hypoglycemia: none No nausea, vomitting, blurred vision, polyuria. 30 units of NPH at night and 30 units novolin before meals. Adjust dose based on BS  Lab Results  Component Value Date   HGBA1C 12.9* 12/12/2012    Wt Readings from Last 3 Encounters:  12/18/12 240 lb 8 oz  (109.09 kg)  11/23/12 242 lb 12 oz (110.111 kg)  06/21/12 255 lb (115.667 kg)    Body mass index is 32.61 kg/(m^2).   Lipids: Trigs are very, very high. Panel reviewed with patient.  Lipids:    Component Value Date/Time   CHOL 223* 12/12/2012 0926   TRIG 1001.0* 12/12/2012 0926   HDL 38.90* 12/12/2012 0926   LDLDIRECT 54.7 12/12/2012 0926   VLDL 200.2* 12/12/2012 0926   CHOLHDL 6 12/12/2012 0926    Lab Results  Component Value Date   ALT 19 12/12/2012   AST 17 12/12/2012   ALKPHOS 75 12/12/2012   BILITOT 0.5 12/12/2012    HTN: Tolerating all medications without side effects Stable and at goal - slightly high today No CP, no sob. No HA.  BP Readings from Last 3 Encounters:  12/18/12 140/76  11/23/12 118/58  11/04/12 107/55    Basic Metabolic Panel:    Component Value Date/Time   NA 129* 12/12/2012 0926   NA 130* 11/25/2011 1119   K 4.1 12/12/2012 0926   CL 96 12/12/2012 0926   CO2 24 12/12/2012 0926   BUN 23 12/12/2012 0926   BUN 25 11/25/2011 1119   CREATININE 1.7* 12/12/2012 0926   GLUCOSE 279* 12/12/2012 0926   GLUCOSE 465* 11/25/2011 1119   CALCIUM 8.9 12/12/2012 0926    CKI: GFR around 40 now, Cr at 1.7, trending worse.  Free test level is in normal limits. Sex hormone binding globulin is 12.  Patient Active Problem List   Diagnosis Date Noted  . 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 Hypogonadism male 06/14/2012  . Hemorrhoids with bleeding (L lateral pile especially) 09/27/2011  . Obesity (BMI 30-39.9) 09/27/2011  . Type I (juvenile type) diabetes mellitus with neurological manifestations, uncontrolled 08/05/2011  . OSA (obstructive sleep apnea) 03/09/2011  . Diabetic neuropathy 12/29/2010  . History of smoking 09/16/2010  . Family history of MI (myocardial infarction) 09/16/2010  . HIP REPLACEMENT, RIGHT, HX OF 02/05/2010  . HYPERLIPIDEMIA 06/13/2008  .  GOUT 06/13/2008  . DEPRESSION 06/13/2008  . HYPERTENSION 06/13/2008  . ALLERGIC RHINITIS 06/13/2008  . GERD 06/13/2008  .  OSTEOARTHRITIS 06/13/2008    Past Medical History  Diagnosis Date  . Depression   . Diabetes mellitus   . Hyperlipidemia   . Hypertension   . Osteoarthritis   . Allergic rhinitis due to pollen   . GERD (gastroesophageal reflux disease)   . Gout   . Diverticulosis   . Personal history of colonic polyps     1996  . Low testosterone   . Iron deficiency   . CKD (chronic kidney disease)   . Hypogonadism male 06/14/2012    Past Surgical History  Procedure Laterality Date  . Cholecystectomy  1980  . Total hip arthroplasty  2009    Dr. Darrelyn Hillock  . Total hip arthroplasty    . Cardiac catheterization  11/2011    [facility]    History   Social History  . Marital Status: Married    Spouse Name: N/A    Number of Children: N/A  . 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 Smoking status: Former Smoker -- 3.00 packs/day for 35 years    Types: Cigarettes    Quit date: 04/12/2000  . Smokeless tobacco: Never Used  . Alcohol Use: No  . Drug Use: No  . Sexual Activity: Not on file   Other Topics Concern  . Not on file   Social History Narrative   No regular exercise    Family History  Problem Relation Age of Onset  . Diabetes Mother   . Diabetes Father   . Lung cancer Sister     lung  . Heart disease Maternal Grandfather   . COPD Sister   . Heart attack Sister   . Heart disease Sister     Allergies  Allergen Reactions  . Tetracycline     REACTION: rash    Medication list has been reviewed and updated.  Outpatient Prescriptions Prior to Visit  Medication Sig Dispense Refill  . aspirin 81 MG chewable tablet Chew 81 mg by mouth once.       [name] Kitchen atenolol (TENORMIN) 100 MG tablet TAKE ONE TABLET BY MOUTH EVERY DAY  90 tablet  3  . b complex vitamins tablet Take 1 tablet by mouth daily.        . cholecalciferol (VITAMIN D) 1000 UNITS tablet Take 1,000 Units by mouth daily.        [name] Kitchen doxazosin  (CARDURA) 8 MG tablet TAKE ONE TABLET BY MOUTH AT BEDTIME  90 tablet  2  . famotidine (PEPCID) 10 MG tablet Take 10 mg by mouth daily.        . fluticasone (FLONASE) 50 MCG/ACT nasal spray Place 2 sprays into the nose daily as needed.  16 g  3  . glucose blood test strip (Onetouch Ultra) test strips-check fasting blood sugar and after breakfast, lunch, and dinner  150 each  3  . insulin NPH (HUMULIN N,NOVOLIN N) 100 UNIT/ML injection Inject 30 Units into the skin at bedtime.      . insulin regular (NOVOLIN R,HUMULIN R) 100 units/mL injection Inject 30 Units into the skin 3 (three) times daily before meals. 3x a day (just before each meal)      . lisinopril-hydrochlorothiazide (PRINZIDE,ZESTORETIC) 20-12.5 MG per tablet TAKE ONE TABLET BY MOUTH EVERY DAY  90 tablet  3  .  niacin (NIASPAN) 1000 MG CR tablet Take 1 tablet (1,000 mg total) by mouth at bedtime. Take an aspirin before taking the niaspan tablet  30 tablet  5  . simvastatin (ZOCOR) 40 MG tablet Take 1 tablet (40 mg total) by mouth at bedtime.  30 tablet  5  . Testosterone (ANDROGEL) 20.25 MG/1.25GM (1.62%) GEL Place 4 application onto the skin daily. Dispense 1 month supply  1 g  5  . venlafaxine XR (EFFEXOR-XR) 75 MG 24 hr capsule TAKE THREE (3) CAPSULES BY MOUTH DAILY  270 capsule  0  . indomethacin (INDOCIN) 25 MG capsule Take 1 capsule (25 mg total) by mouth 2 (two) times daily with a meal. Take two tablets every 12 hours as needed for gout pain  60 capsule  2  . albuterol (PROVENTIL HFA;VENTOLIN HFA) 108 (90 BASE) MCG/ACT inhaler Inhale 2 puffs into the lungs every 6 (six) hours as needed for wheezing.  1 Inhaler  0  . hydrocortisone (ANUSOL-HC) 25 MG suppository Place 1 suppository (25 mg total) rectally 2 (two) times daily as needed for hemorrhoids.  24 suppository  2  . nystatin cream (MYCOSTATIN) Apply topically 2 (two) times daily.  30 g  0  . sulfamethoxazole-trimethoprim (BACTRIM DS) 800-160 MG per tablet Take 1 tablet by mouth 2  (two) times daily.  56 tablet  0   No facility-administered medications prior to visit.    Review of Systems:   General: Denies fever, chills, sweats. No significant weight loss. Eyes: Denies blurring,significant itching ENT: Denies earache, sore throat, and hoarseness. Cardiovascular: Denies chest pains, palpitations, dyspnea on exertion Respiratory: Denies cough, dyspnea at rest,wheeezing Breast: no concerns about lumps GI: Denies nausea, vomiting, diarrhea, constipation, change in bowel habits, abdominal pain, melena, hematochezia GU: Denies penile discharge, ED, urinary flow / outflow problems. No STD concerns. Increased urination. Musculoskeletal: Denies back pain, joint pain Derm: Denies rash, itching Neuro: Denies  paresthesias, frequent falls, frequent headaches Psych: Denies depression, anxiety. Worried some about memory. Endocrine: Denies cold intolerance, heat intolerance, polydipsia Heme: Denies enlarged lymph nodes Allergy: No hayfever   Physical Examination: BP 140/76  Pulse 73  Temp(Src) 97.6 F (36.4 C) (Oral)  Ht 6' (1.829 m)  Wt 240 lb 8 oz (109.09 kg)  BMI 32.61 kg/m2  Ideal Body Weight: Weight in (lb) to have BMI = 25: 183.9   Wt Readings from Last 3 Encounters:  12/18/12 240 lb 8 oz (109.09 kg)  11/23/12 242 lb 12 oz (110.111 kg)  06/21/12 255 lb (115.667 kg)    GEN: well developed, well nourished, no acute distress Eyes: conjunctiva and lids normal, PERRLA, EOMI ENT: TM clear, nares clear, oral exam WNL Neck: supple, no lymphadenopathy, no thyromegaly, no JVD Pulm: clear to auscultation and percussion, respiratory effort normal CV: regular rate and rhythm, S1-S2, no murmur, rub or gallop, no bruits, peripheral pulses normal and symmetric, no cyanosis, clubbing, edema or varicosities Chest: no scars, masses GI: soft, non-tender; no hepatosplenomegaly, masses; active bowel sounds all quadrants GU: no hernia, testicular mass, penile discharge, or  prostate enlargement Lymph: no cervical, axillary or inguinal adenopathy MSK: gait normal, muscle tone and strength WNL, no joint swelling, effusions, discoloration, crepitus  SKIN: clear, good turgor, color WNL, no rashes, lesions, or ulcerations Neuro: normal mental status, normal strength, sensation, and motion Psych: alert; oriented to person, place and time, normally interactive and not anxious or depressed in appearance.  Full fostein done, 27/30.  Assessment and Plan:  Routine general medical examination at  a health care facility  Type I (juvenile type) diabetes mellitus with neurological manifestations, uncontrolled - Plan: Ambulatory referral to Endocrinology: doing very poorly, eating poorly, a1c 13. Lost to follow-up with endocrine. I will need help here. Consult Dr. Lafe Garin for her help and expertise.   Need for prophylactic vaccination and inoculation against influenza - Plan: Flu Vaccine QUAD 36+ mos PF IM (Fluarix)  HYPERLIPIDEMIA: doing very poorly. Trigs > 1000 on niaspan 1000. Diet very poor. On Zocor 40 mg. Work on diet, weight loss, increase niaspan to 2000 mg.  HYPERTENSION: slightly elevated today, has been normal. OK to follow.  Diabetic neuropathy  Obesity (BMI 30-39.9)  Hypogonadism male: Free test is in normal limits.  Chronic kidney disease, stage III (moderate): also an issue. For now, I want to try to get his BS under control. In future, we may need nephrology assistance, too.   I have personally reviewed the Medicare Annual Wellness questionnaire and have noted 1. The patient's medical and social history 2. Their use of alcohol, tobacco or illicit drugs 3. Their current medications and supplements 4. The patient's functional ability including ADL's, fall risks, home safety risks and hearing or visual             impairment. 5. Diet and physical activities 6. Evidence for depression or mood disorders  The patients weight, height, BMI and visual acuity  have been recorded in the chart I have made referrals, counseling and provided education to the patient based review of the above and I have provided the pt with a written personalized care plan for preventive services.  I have provided the patient with a copy of your personalized plan for preventive services. Instructed to take the time to review along with their updated medication list.   Orders Today:  Orders Placed This Encounter  Procedures  . Flu Vaccine QUAD 36+ mos PF IM (Fluarix)  . Ambulatory referral to Endocrinology    Referral Priority:  Routine    Referral Type:  Consultation    Referral Reason:  Specialty Services Required    Number of Visits Requested:  1    Updated Medication List: (Includes new medications, updates to list, dose adjustments) Meds ordered this encounter  Medications  . indomethacin (INDOCIN) 25 MG capsule    Sig: Take two tablets every 12 hours as needed for gout pain  . atenolol (TENORMIN) 100 MG tablet    Sig: Take 1 tablet (100 mg total) by mouth daily.    Dispense:  90 tablet    Refill:  3  . lisinopril-hydrochlorothiazide (PRINZIDE,ZESTORETIC) 20-12.5 MG per tablet    Sig: Take 1 tablet by mouth daily.    Dispense:  90 tablet    Refill:  3  . doxazosin (CARDURA) 8 MG tablet    Sig: Take 0.5 tablets (4 mg total) by mouth at bedtime.    Dispense:  90 tablet    Refill:  2  . niacin (NIASPAN) 1000 MG CR tablet    Sig: Take 2 tablets (2,000 mg total) by mouth at bedtime. Take an aspirin before taking the niaspan tablet    Dispense:  180 tablet    Refill:  3    Medications Discontinued: Medications Discontinued During This Encounter  Medication Reason  . sulfamethoxazole-trimethoprim (BACTRIM DS) 800-160 MG per tablet Completed Course  . indomethacin (INDOCIN) 25 MG capsule   . atenolol (TENORMIN) 100 MG tablet Reorder  . lisinopril-hydrochlorothiazide (PRINZIDE,ZESTORETIC) 20-12.5 MG per tablet Reorder  . doxazosin (CARDURA) 8  MG tablet  Reorder  . niacin (NIASPAN) 1000 MG CR tablet Reorder      Signed, Clint Biello T. Floyed Masoud, MD 12/18/2012 8:56 AM

## 2012-12-18 NOTE — Progress Notes (Signed)
Called [facility] and cancelled prescription for Niaspan and called script to [facility] per Dr. Patsy Lager at patient's request.

## 2012-12-19 ENCOUNTER — Encounter: Payer: Self-pay | Admitting: Family Medicine

## 2012-12-19 DIAGNOSIS — N183 Chronic kidney disease, stage 3 unspecified: Secondary | ICD-10-CM | POA: Insufficient documentation

## 2012-12-19 HISTORY — DX: Chronic kidney disease, stage 3 unspecified: N18.30

## 2013-01-31 ENCOUNTER — Ambulatory Visit: Payer: Medicare Other | Admitting: Internal Medicine

## 2013-02-02 ENCOUNTER — Other Ambulatory Visit: Payer: Self-pay

## 2013-02-02 MED ORDER — FLUTICASONE PROPIONATE 50 MCG/ACT NA SUSP: 2.0000 | Freq: Every day | NASAL | Status: DC | PRN

## 2013-02-02 NOTE — Telephone Encounter (Signed)
Pt left note requesting refill flonase to [place]. Advised pt done.

## 2013-03-21 ENCOUNTER — Encounter: Payer: Self-pay | Admitting: Internal Medicine

## 2013-03-21 ENCOUNTER — Ambulatory Visit (INDEPENDENT_AMBULATORY_CARE_PROVIDER_SITE_OTHER): Payer: Medicare Other | Admitting: Internal Medicine

## 2013-03-21 VITALS — BP 124/70 | HR 82 | Temp 97.9°F | Resp 12 | Ht 72.0 in | Wt 247.0 lb

## 2013-03-21 DIAGNOSIS — E1049 Type 1 diabetes mellitus with other diabetic neurological complication: Secondary | ICD-10-CM

## 2013-03-21 MED ORDER — INSULIN NPH (HUMAN) (ISOPHANE) 100 UNIT/ML ~~LOC~~ SUSP: SUBCUTANEOUS | Status: DC

## 2013-03-21 MED ORDER — INSULIN REGULAR HUMAN 100 UNIT/ML IJ SOLN: INTRAMUSCULAR | Status: DC

## 2013-03-21 NOTE — Patient Instructions (Signed)
Please return in 1 month with your sugar log.  Please inject insulin 30 min before a meal. You can mix the insulin N and R in the same syringe. Please change the insulin regimen as follows:  Type of insulin Breakfast Lunch Dinner  Humulin R 20 25 25   Humulin N 30  30   Please call with sugars consistently <90 and >200.  PATIENT INSTRUCTIONS FOR TYPE 2 DIABETES:  DIET AND EXERCISE Diet and exercise is an important part of diabetic treatment.  We recommended aerobic exercise in the form of brisk walking (working between 40-60% of maximal aerobic capacity, similar to brisk walking) for 150 minutes per week (such as 30 minutes five days per week) along with 3 times per week performing 'resistance' training (using various gauge rubber tubes with handles) 5-10 exercises involving the major muscle groups (upper body, lower body and core) performing 10-15 repetitions (or near fatigue) each exercise. Start at half the above goal but build slowly to reach the above goals. If limited by weight, joint pain, or disability, we recommend daily walking in a swimming pool with water up to waist to reduce pressure from joints while allow for adequate exercise.    BLOOD GLUCOSES Monitoring your blood glucoses is important for continued management of your diabetes. Please check your blood glucoses 2-4 times a day: fasting, before meals and at bedtime (you can rotate these measurements - e.g. one day check before the 3 meals, the next day check before 2 of the meals and before bedtime, etc.   HYPOGLYCEMIA (low blood sugar) Hypoglycemia is usually a reaction to not eating, exercising, or taking too much insulin/ other diabetes drugs.  Symptoms include tremors, sweating, hunger, confusion, headache, etc. Treat IMMEDIATELY with 15 grams of Carbs:   4 glucose tablets    cup regular juice/soda   2 tablespoons raisins   4 teaspoons sugar   1 tablespoon honey Recheck blood glucose in 15 mins and repeat above if  still symptomatic/blood glucose <100. Please contact our office at [phone] if you have questions about how to next handle your insulin.  RECOMMENDATIONS TO REDUCE YOUR RISK OF DIABETIC COMPLICATIONS: * Take your prescribed MEDICATION(S). * Follow a DIABETIC diet: Complex carbs, fiber rich foods, heart healthy fish twice weekly, (monounsaturated and polyunsaturated) fats * AVOID saturated/trans fats, high fat foods, >2,300 mg salt per day. * EXERCISE at least 5 times a week for 30 minutes or preferably daily.  * DO NOT SMOKE OR DRINK more than 1 drink a day. * Check your FEET every day. Do not wear tightfitting shoes. Contact us if you develop an ulcer * See your EYE doctor once a year or more if needed * Get a FLU shot once a year * Get a PNEUMONIA vaccine once before and once after age 27 years  GOALS:  * Your Hemoglobin A1c of <7%  * fasting sugars need to be <130 * after meals sugars need to be <180 (2h after you start eating) * Your Systolic BP should be 140 or lower  * Your Diastolic BP should be 80 or lower  * Your HDL (Good Cholesterol) should be 40 or higher  * Your LDL (Bad Cholesterol) should be 100 or lower  * Your Triglycerides should be 150 or lower  * Your Urine microalbumin (kidney function) should be <30 * Your Body Mass Index should be 25 or lower   We will be glad to help you achieve these goals. Our telephone number is: [phone].

## 2013-03-21 NOTE — Progress Notes (Signed)
Patient ID: [name], male   DOB: 1945/06/10, 67 y.o.   [name]: [id number]  HPI: [name] is a 67 y.o.-year-old male, referred by his PCP, Dr. Patsy Lager, for management of DM2, insulin-dependent, uncontrolled, with complications (CKD stage 3, PN). He saw Dr Marylyn Ishihara, last visit 3 years ago.  Patient has been diagnosed with diabetes in ~1997; he started insulin in 2010. Last hemoglobin A1c was: Lab Results  Component Value Date   HGBA1C 12.9* 12/12/2012   HGBA1C 9.6* 08/05/2011   HGBA1C 9.6* 04/27/2011  He believes his HbA1c increased as his diet worsened.  Pt is on a regimen of: - NPH (Novolin N) 70 units qhs - Novolin R 100 units hs  Pt checks his sugars once a day and they are: - am: 150-170 - 2h after b'fast: n/c - before lunch: n/c - 2h after lunch: [place] - before dinner: n/c - 2h after dinner: n/c - bedtime: n/c - nighttime: 60s >> wakes him up  No lows. Lowest sugar was 70 in the last 6 mo; he has hypoglycemia awareness at 100.  Highest sugar was HI ~1 mo ago - did not get his insulin the night before.  Pt's meals are: - Breakfast: 2 slices buttered toast - Lunch: ham sandwich + chips - Dinner: meat + veggies + starch - Snacks: 4-5 x a day  - has CKD, last BUN/creatinine:  Lab Results  Component Value Date   BUN 23 12/12/2012   CREATININE 1.7* 12/12/2012  Last ACR 4 on 12/12/2012). On Lisinopril 20.  - last set of lipids: Lab Results  Component Value Date   CHOL 223* 12/12/2012   HDL 38.90* 12/12/2012   LDLCALC 43 04/24/2009   LDLDIRECT 54.7 12/12/2012   TRIG 1001.0* 12/12/2012   CHOLHDL 6 12/12/2012  On Zocor 40. - last eye exam was in 2013. No DR.  -+ numbness and tingling in his feet, especially at night.  Pt has FH of DM in father, GMs, uncles.  ROS: Constitutional: no weight gain/loss, + fatigue, no subjective hyperthermia/hypothermia, + poor sleep Eyes: no blurry vision, no xerophthalmia ENT: no sore throat, no nodules palpated in throat, no  dysphagia/odynophagia,+ hoarseness; + tinnitus, + decreased hearing Cardiovascular: + CP - occasionally  -had previous EKGs, saw cardiology, had cath 11/2011/+ SOB/no palpitations/leg swelling Respiratory: no cough/SOB Gastrointestinal: no N/V/+ D/no C/+ heartburn Musculoskeletal: no muscle/joint aches Skin: no rashes, + easy bruising Neurological: no tremors/numbness/tingling/dizziness Psychiatric: + depression/no anxiety + diff with erections, + low libido  Past Medical History  Diagnosis Date  . Depression   . Diabetes mellitus   . Hyperlipidemia   . Hypertension   . Osteoarthritis   . Allergic rhinitis due to pollen   . GERD (gastroesophageal reflux disease)   . Gout   . Diverticulosis   . Personal history of colonic polyps     1996  . Low testosterone   . Iron deficiency   . CKD (chronic kidney disease)   . Hypogonadism male 06/14/2012  . Chronic kidney disease, stage III (moderate) 12/19/2012   Past Surgical History  Procedure Laterality Date  . Cholecystectomy  1980  . Total hip arthroplasty  2009    Dr. Darrelyn Hillock  . Total hip arthroplasty    . Cardiac catheterization  11/2011    [facility]   History   Social History  . 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 Smoking status: Former Smoker -- 3.00 packs/day for 35 years    Types: Cigarettes    Quit date: 04/12/2000  . Smokeless tobacco: Never Used  . Alcohol Use: No  . Drug Use: No   Social History Narrative   No regular exercise   Caffeine use: yes, dt coke   Current Outpatient Prescriptions on File Prior to Visit  Medication Sig Dispense Refill  . aspirin 81 MG chewable tablet Chew 81 mg by mouth once.       [name] Kitchen atenolol (TENORMIN) 100 MG tablet Take 1 tablet (100 mg total) by mouth daily.  90 tablet  3  . b complex vitamins tablet Take 1 tablet by mouth daily.        . cholecalciferol (VITAMIN  D) 1000 UNITS tablet Take 1,000 Units by mouth daily.        [name] Kitchen doxazosin (CARDURA) 8 MG tablet Take 0.5 tablets (4 mg total) by mouth at bedtime.  90 tablet  2  . famotidine (PEPCID) 10 MG tablet Take 10 mg by mouth daily.        . fluticasone (FLONASE) 50 MCG/ACT nasal spray Place 2 sprays into the nose daily as needed.  16 g  3  . glucose blood test strip (Onetouch Ultra) test strips-check fasting blood sugar and after breakfast, lunch, and dinner  150 each  3  . hydrocortisone (ANUSOL-HC) 25 MG suppository Place 1 suppository (25 mg total) rectally 2 (two) times daily as needed for hemorrhoids.  24 suppository  2  . indomethacin (INDOCIN) 25 MG capsule Take two tablets every 12 hours as needed for gout pain      . insulin regular (NOVOLIN R,HUMULIN R) 100 units/mL injection Inject 30 Units into the skin 3 (three) times daily before meals. 3x a day (just before each meal)      . lisinopril-hydrochlorothiazide (PRINZIDE,ZESTORETIC) 20-12.5 MG per tablet Take 1 tablet by mouth daily.  90 tablet  3  . niacin (NIASPAN) 1000 MG CR tablet Take 2 tablets (2,000 mg total) by mouth at bedtime. Take an aspirin before taking the niaspan tablet  180 tablet  3  . nystatin cream (MYCOSTATIN) Apply topically 2 (two) times daily.  30 g  0  . simvastatin (ZOCOR) 40 MG tablet Take 1 tablet (40 mg total) by mouth at bedtime.  30 tablet  5  . Testosterone (ANDROGEL) 20.25 MG/1.25GM (1.62%) GEL Place 4 application onto the skin daily. Dispense 1 month supply  1 g  5  . venlafaxine XR (EFFEXOR-XR) 75 MG 24 hr capsule TAKE THREE (3) CAPSULES BY MOUTH DAILY  270 capsule  0  . albuterol (PROVENTIL HFA;VENTOLIN HFA) 108 (90 BASE) MCG/ACT inhaler Inhale 2 puffs into the lungs every 6 (six) hours as needed for wheezing.  1 Inhaler  0  . insulin NPH (HUMULIN N,NOVOLIN N) 100 UNIT/ML injection Inject 30 Units into the skin at bedtime.       No current facility-administered medications on file prior to visit.   Allergies   Allergen Reactions  . Tetracycline     REACTION: rash   Family History  Problem Relation Age of Onset  . Diabetes Mother   . Diabetes Father   . Lung cancer Sister     lung  . Heart disease Maternal Grandfather   . COPD Sister   . Heart attack Sister   . Heart disease Sister      PE: BP  124/70  Pulse 82  Temp(Src) 97.9 F (36.6 C) (Oral)  Resp 12  Ht 6' (1.829 m)  Wt 247 lb (112.038 kg)  BMI 33.49 kg/m2  SpO2 95% Wt Readings from Last 3 Encounters:  03/21/13 247 lb (112.038 kg)  12/18/12 240 lb 8 oz (109.09 kg)  11/23/12 242 lb 12 oz (110.111 kg)   Constitutional: overweight, in NAD Eyes: PERRLA, EOMI, no exophthalmos ENT: moist mucous membranes, no thyromegaly, no cervical lymphadenopathy Cardiovascular: RRR, No MRG Respiratory: CTA B Gastrointestinal: abdomen soft, NT, ND, BS+ Musculoskeletal: no deformities, strength intact in all 4 Skin: moist, warm, no rashes Neurological: no tremor with outstretched hands, DTR normal in all 4  ASSESSMENT: 1. DM2, insulin-dependent, uncontrolled, with complications - CKD stage 3 - PN  PLAN:  1. Patient with long-standing, recently more uncontrolled diabetes, on on a suboptimal insulin regimen, with a very large dose of short insulin taken at bedtime and an intermediate-acting insulin taken once a day only.  - We discussed about options for treatment, and I suggested to:  Patient Instructions  Please inject insulin 30 min before a meal. You can mix the insulin N and R in the same syringe. Please change the insulin regimen as follows: Type of insulin Breakfast Lunch Dinner  Humulin R 20 25 25   Humulin N 30  30  Please call with sugars consistently <90 and >200. - will send him instructions about how to mix the insulins through MyChart: [email or web address] - Strongly advised him to start checking sugars at different times of the day - check 3 times a day, rotating  checks - given sugar log and advised how to fill it and to bring it at next appt  - given foot care handout and explained the principles  - given instructions for hypoglycemia management "15-15 rule"  - advised for yearly eye exams - Return to clinic in 1 mo with sugar log

## 2013-03-28 ENCOUNTER — Ambulatory Visit (INDEPENDENT_AMBULATORY_CARE_PROVIDER_SITE_OTHER): Payer: Medicare Other | Admitting: Family Medicine

## 2013-03-28 ENCOUNTER — Encounter: Payer: Self-pay | Admitting: Family Medicine

## 2013-03-28 VITALS — BP 130/58 | HR 80 | Temp 98.3°F | Ht 72.0 in | Wt 247.5 lb

## 2013-03-28 DIAGNOSIS — R05 Cough: Secondary | ICD-10-CM | POA: Diagnosis not present

## 2013-03-28 DIAGNOSIS — J189 Pneumonia, unspecified organism: Secondary | ICD-10-CM | POA: Diagnosis not present

## 2013-03-28 DIAGNOSIS — R509 Fever, unspecified: Secondary | ICD-10-CM | POA: Diagnosis not present

## 2013-03-28 DIAGNOSIS — R059 Cough, unspecified: Secondary | ICD-10-CM

## 2013-03-28 LAB — POCT INFLUENZA A/B: Influenza B, POC: NEGATIVE

## 2013-03-28 MED ORDER — AZITHROMYCIN 250 MG PO TABS: ORAL_TABLET | ORAL | Status: DC

## 2013-03-28 NOTE — Progress Notes (Signed)
Pre-visit discussion using our clinic review tool. No additional management support is needed unless otherwise documented below in the visit note.  

## 2013-03-28 NOTE — Progress Notes (Signed)
Patient Name: [name] Date of Birth: 1945/05/19 Medical Record Number: [id number]  History of Present Illness:  Patent presents with runny nose, sneezing, cough, sore throat, malaise.  Really congested and coughing up a lot.  Has had a fever to 101.8 Last night it hit again.  Aching. ? flu  + recent exposure to others with similar symptoms.   The patent denies sore throat as the primary complaint. Denies sthortness of breath/wheezing, high fever, chest pain, rhinits for more than 14 days, significant myalgia, otalgia, facial pain, abdominal pain, changes in bowel or bladder.  PMH, PHS, Allergies, Problem List, Medications, Family History, and Social History have all been reviewed.  Patient Active Problem List   Diagnosis Date Noted  . Chronic kidney disease, stage III (moderate) 12/19/2012  . Hypogonadism male 06/14/2012  . Hemorrhoids with bleeding (L lateral pile especially) 09/27/2011  . Obesity (BMI 30-39.9) 09/27/2011  . Type I (juvenile type) diabetes mellitus with neurological manifestations, uncontrolled 08/05/2011  . OSA (obstructive sleep apnea) 03/09/2011  . Diabetic neuropathy 12/29/2010  . History of smoking 09/16/2010  . Family history of MI (myocardial infarction) 09/16/2010  . HIP REPLACEMENT, RIGHT, HX OF 02/05/2010  . HYPERLIPIDEMIA 06/13/2008  . GOUT 06/13/2008  . DEPRESSION 06/13/2008  . HYPERTENSION 06/13/2008  . ALLERGIC RHINITIS 06/13/2008  . GERD 06/13/2008  . OSTEOARTHRITIS 06/13/2008    Past Medical History  Diagnosis Date  . Depression   . Diabetes mellitus   . Hyperlipidemia   . Hypertension   . Osteoarthritis   . Allergic rhinitis due to pollen   . GERD (gastroesophageal reflux disease)   . Gout   . Diverticulosis   . Personal history of colonic polyps     1996  . Low testosterone   . Iron deficiency   . CKD (chronic kidney disease)   . Hypogonadism male 06/14/2012  . Chronic kidney disease, stage III (moderate) 12/19/2012      Past Surgical History  Procedure Laterality Date  . Cholecystectomy  1980  . Total hip arthroplasty  2009    Dr. Darrelyn Hillock  . Total hip arthroplasty    . Cardiac catheterization  11/2011    [facility]    History   Social History  . Marital Status: Married    Spouse Name: N/A    Number of Children: N/A  . 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 Smoking status: Former Smoker -- 3.00 packs/day for 35 years    Types: Cigarettes    Quit date: 04/12/2000  . Smokeless tobacco: Never Used  . Alcohol Use: No  . Drug Use: No  . Sexual Activity: Not on file   Other Topics Concern  . Not on file   Social History Narrative   No regular exercise   Caffeine use: yes, dt coke    Family History  Problem Relation Age of Onset  . Diabetes Mother   . Diabetes Father   . Lung cancer Sister     lung  . Heart disease Maternal Grandfather   . COPD Sister   . Heart attack Sister   . Heart disease Sister     Allergies  Allergen Reactions  . Tetracycline     REACTION: rash    Medication list reviewed and updated in full in [facility].  Review of Systems: as above, eating and drinking - tolerating PO. Urinating normally. No  excessive vomitting or diarrhea. O/w as above.  Physical Exam:  Filed Vitals:   03/28/13 1003  BP: 130/58  Pulse: 80  Temp: 98.3 F (36.8 C)  TempSrc: Oral  Height: 6' (1.829 m)  Weight: 247 lb 8 oz (112.265 kg)    GEN: WDWN, Non-toxic, Atraumatic, normocephalic. A and O x 3. HEENT: Oropharynx clear without exudate, MMM, no significant LAD, mild rhinnorhea Ears: TM clear, COL visualized with good landmarks CV: RRR, no m/g/r. Pulm: CTA B, no wheezes, scattered rhonchi without crackles, normal respiratory effort. EXT: no c/c/e Psych: well oriented, neither depressed nor anxious in appearance  Objective Data: Results for orders placed in visit on 03/28/13  POCT  INFLUENZA A/B      Result Value Range   Influenza A, POC Negative     Influenza B, POC Negative      CAP (community acquired pneumonia)  Cough - Plan: Influenza A/B  Fever, unspecified - Plan: Influenza A/B  With f 102, neg flu, you have to assume pna and treat.  Supportive care, too  Patient Instructions  PNEUMONIA -Viral or baterial infections of the lung. Fever, cough, chest pain, shortness of breath, phlegm production, fatigue are symptoms.  Treatment: 1. Take all medicines 2. Antibiotics  3. Cough suppressants 4. Bronchodilators: an inhaler 5. Expectorant like Guaifenesin (Robitussin, Mucinex)  Fluids and Moisture help: drink lots of fluids Vaporizier or humidifier in room, shower steam --help loosen secretions and sooth breathing passages  Elevate head slightly when trying to sleep.    Orders Today:  Orders Placed This Encounter  Procedures  . Influenza A/B    New medications, updates to list, dose adjustments: Meds ordered this encounter  Medications  . azithromycin (ZITHROMAX Z-PAK) 250 MG tablet    Sig: Take 2 tablets (500 mg) on  Day 1,  followed by 1 tablet (250 mg) once daily on Days 2 through 5.    Dispense:  6 each    Refill:  0    Signed,  Annalisa Colonna T. Nashayla Telleria, MD, CAQ Sports Medicine  [facility] at [facility] [address] [place] [address] Phone: [phone] Fax: [phone]  Updated Complete Medication List:   Medication List       This list is accurate as of: 03/28/13 11:59 PM.  Always use your most recent med list.               albuterol 108 (90 BASE) MCG/ACT inhaler  Commonly known as:  PROVENTIL HFA;VENTOLIN HFA  Inhale 2 puffs into the lungs every 6 (six) hours as needed for wheezing.     aspirin 81 MG chewable tablet  Chew 81 mg by mouth once.     atenolol 100 MG tablet  Commonly known as:  TENORMIN  Take 1 tablet (100 mg total) by mouth daily.     azithromycin 250 MG tablet  Commonly known as:   ZITHROMAX Z-PAK  Take 2 tablets (500 mg) on  Day 1,  followed by 1 tablet (250 mg) once daily on Days 2 through 5.     b complex vitamins tablet  Take 1 tablet by mouth daily.     cholecalciferol 1000 UNITS tablet  Commonly known as:  VITAMIN D  Take 1,000 Units by mouth daily.     doxazosin 8 MG tablet  Commonly known as:  CARDURA  Take 0.5 tablets (4 mg total) by mouth at bedtime.     famotidine 10 MG tablet  Commonly known as:  PEPCID  Take 10 mg by mouth daily.     fluticasone 50 MCG/ACT nasal spray  Commonly known as:  FLONASE  Place 2 sprays into the nose daily as needed.     glucose blood test strip  (Onetouch Ultra) test strips-check fasting blood sugar and after breakfast, lunch, and dinner     hydrocortisone 25 MG suppository  Commonly known as:  ANUSOL-HC  Place 1 suppository (25 mg total) rectally 2 (two) times daily as needed for hemorrhoids.     indomethacin 25 MG capsule  Commonly known as:  INDOCIN  Take two tablets every 12 hours as needed for gout pain     insulin NPH 100 UNIT/ML injection  Commonly known as:  NOVOLIN N  Inject 30 units before breakfast and dinner     insulin regular 100 units/mL injection  Commonly known as:  NOVOLIN R,HUMULIN R  Inject under skin 3x a day up to 70 units daily as advised     lisinopril-hydrochlorothiazide 20-12.5 MG per tablet  Commonly known as:  PRINZIDE,ZESTORETIC  Take 1 tablet by mouth daily.     niacin 1000 MG CR tablet  Commonly known as:  NIASPAN  Take 2 tablets (2,000 mg total) by mouth at bedtime. Take an aspirin before taking the niaspan tablet     nystatin cream  Commonly known as:  MYCOSTATIN  Apply topically 2 (two) times daily.     simvastatin 40 MG tablet  Commonly known as:  ZOCOR  Take 1 tablet (40 mg total) by mouth at bedtime.     Testosterone 20.25 MG/1.25GM (1.62%) Gel  Commonly known as:  ANDROGEL  Place 4 application onto the skin daily. Dispense 1 month supply     venlafaxine XR  75 MG 24 hr capsule  Commonly known as:  EFFEXOR-XR  TAKE THREE (3) CAPSULES BY MOUTH DAILY

## 2013-03-29 NOTE — Patient Instructions (Signed)
PNEUMONIA -Viral or baterial infections of the lung. Fever, cough, chest pain, shortness of breath, phlegm production, fatigue are symptoms.  Treatment: 1. Take all medicines 2. Antibiotics  3. Cough suppressants 4. Bronchodilators: an inhaler 5. Expectorant like Guaifenesin (Robitussin, Mucinex)  Fluids and Moisture help: drink lots of fluids Vaporizier or humidifier in room, shower steam --help loosen secretions and sooth breathing passages  Elevate head slightly when trying to sleep.

## 2013-04-05 ENCOUNTER — Other Ambulatory Visit: Payer: Self-pay | Admitting: Family Medicine

## 2013-04-06 NOTE — Telephone Encounter (Signed)
[name], can you check and see what is wrong with him

## 2013-04-06 NOTE — Telephone Encounter (Signed)
Check on [name].

## 2013-04-06 NOTE — Telephone Encounter (Signed)
Please call and check on him

## 2013-04-24 ENCOUNTER — Ambulatory Visit: Payer: Medicare Other | Admitting: Internal Medicine

## 2013-04-24 DIAGNOSIS — Z0289 Encounter for other administrative examinations: Secondary | ICD-10-CM

## 2013-04-25 ENCOUNTER — Encounter: Payer: Self-pay | Admitting: Family Medicine

## 2013-04-25 ENCOUNTER — Ambulatory Visit: Payer: Medicare Other | Admitting: Family Medicine

## 2013-04-25 ENCOUNTER — Ambulatory Visit (INDEPENDENT_AMBULATORY_CARE_PROVIDER_SITE_OTHER): Payer: Medicare Other | Admitting: Family Medicine

## 2013-04-25 ENCOUNTER — Ambulatory Visit: Payer: Medicare Other | Admitting: Internal Medicine

## 2013-04-25 VITALS — BP 158/76 | HR 130 | Temp 97.8°F | Ht 72.0 in | Wt 244.8 lb

## 2013-04-25 DIAGNOSIS — E119 Type 2 diabetes mellitus without complications: Secondary | ICD-10-CM

## 2013-04-25 DIAGNOSIS — J069 Acute upper respiratory infection, unspecified: Secondary | ICD-10-CM

## 2013-04-25 MED ORDER — GLUCOSE BLOOD VI STRP: ORAL_STRIP | Status: DC

## 2013-04-25 MED ORDER — BENZONATATE 200 MG PO CAPS: 200.0000 mg | ORAL_CAPSULE | Freq: Three times a day (TID) | ORAL | Status: DC | PRN

## 2013-04-25 NOTE — Progress Notes (Signed)
Date:  04/25/2013   Name:  [name]   DOB:  November 01, 1945   [name]:  [id number] Gender: male Age: 68 y.o.  Primary Physician:  [name], MD   Chief Complaint: Nasal Congestion   Subjective:   History of Present Illness:  [name] is a 68 y.o. very pleasant male patient who presents with the following:  Coughing up a lot. Quit smoking 15 years, smoked like 3 packs. 120 packs years.  He basically has been having some URI symptoms with some nasal congestion and cough, he also does have some mild laryngitis and a scratchy voice. He is eating and drinking normally without any significant nausea, vomiting, or diarrhea. No other significant rashes.  Taking ampicillin for acne.   Past Medical History, Surgical History, Social History, Family History, Problem List, Medications, and Allergies have been reviewed and updated if relevant.  Review of Systems: ROS: GEN: Acute illness details above GI: Tolerating PO intake GU: maintaining adequate hydration and urination Pulm: No SOB Interactive and getting along well at home.  Otherwise, ROS is as per the HPI.   Objective:   Physical Examination: BP 158/76  Pulse 130  Temp(Src) 97.8 F (36.6 C) (Oral)  Ht 6' (1.829 m)  Wt 244 lb 12 oz (111.018 kg)  BMI 33.19 kg/m2 Pulse 80 on my recheck. [name], MD    Gen: WDWN, NAD; A & O x3, cooperative. Pleasant.Globally Non-toxic HEENT: Normocephalic and atraumatic. Throat clear, w/o exudate, R TM clear, L TM - good landmarks, No fluid present. rhinnorhea.  MMM Frontal sinuses: NT Max sinuses: NT NECK: Anterior cervical  LAD is absent CV: RRR, No M/G/R, cap refill <2 sec PULM: Breathing comfortably in no respiratory distress. no wheezing, crackles, rhonchi EXT: No c/c/e PSYCH: Friendly, good eye contact MSK: Nml gait     Laboratory and Imaging Data:  Assessment & Plan:    URI (upper respiratory infection)  DIABETES MELLITUS, TYPE II - Plan: glucose  blood test strip  Reassurance, continue with supportive care, Tessalon when necessary for cough. Plenty of fluids.  There are no Patient Instructions on file for this visit.  No orders of the defined types were placed in this encounter.    New medications, updates to list, dose adjustments: Meds ordered this encounter  Medications  . benzonatate (TESSALON) 200 MG capsule    Sig: Take 1 capsule (200 mg total) by mouth 3 (three) times daily as needed for cough.    Dispense:  50 capsule    Refill:  3  . glucose blood test strip    Sig: (Onetouch Ultra) test strips-check fasting blood sugar and after breakfast, lunch, and dinner    Dispense:  150 each    Refill:  3  . ampicillin (PRINCIPEN) 500 MG capsule    Sig: Take 1 capsule by mouth daily.    Signed,  Maud Deed. Wilmary Levit, MD, [place] at [facility] [place] [address] Phone: [phone] Fax: [phone]    Medication List       This list is accurate as of: 04/25/13  2:52 PM.  Always use your most recent med list.               albuterol 108 (90 BASE) MCG/ACT inhaler  Commonly known as:  PROVENTIL HFA;VENTOLIN HFA  Inhale 2 puffs into the lungs every 6 (six) hours as needed for wheezing.     ampicillin 500 MG capsule  Commonly known as:  PRINCIPEN  Take 1 capsule by mouth daily.     aspirin 81 MG chewable tablet  Chew 81 mg by mouth once.     atenolol 100 MG tablet  Commonly known as:  TENORMIN  Take 1 tablet (100 mg total) by mouth daily.     b complex vitamins tablet  Take 1 tablet by mouth daily.     benzonatate 200 MG capsule  Commonly known as:  TESSALON  Take 1 capsule (200 mg total) by mouth 3 (three) times daily as needed for cough.     cholecalciferol 1000 UNITS tablet  Commonly known as:  VITAMIN D  Take 1,000 Units by mouth daily.     doxazosin 8 MG tablet  Commonly known as:  CARDURA  Take 0.5 tablets (4 mg total) by mouth at bedtime.       famotidine 10 MG tablet  Commonly known as:  PEPCID  Take 10 mg by mouth daily.     fluticasone 50 MCG/ACT nasal spray  Commonly known as:  FLONASE  Place 2 sprays into the nose daily as needed.     glucose blood test strip  (Onetouch Ultra) test strips-check fasting blood sugar and after breakfast, lunch, and dinner     hydrocortisone 25 MG suppository  Commonly known as:  ANUSOL-HC  Place 1 suppository (25 mg total) rectally 2 (two) times daily as needed for hemorrhoids.     indomethacin 25 MG capsule  Commonly known as:  INDOCIN  Take two tablets every 12 hours as needed for gout pain     insulin NPH Human 100 UNIT/ML injection  Commonly known as:  NOVOLIN N  Inject 30 units before breakfast and dinner     insulin regular 100 units/mL injection  Commonly known as:  NOVOLIN R,HUMULIN R  Inject under skin 3x a day up to 70 units daily as advised     lisinopril-hydrochlorothiazide 20-12.5 MG per tablet  Commonly known as:  PRINZIDE,ZESTORETIC  Take 1 tablet by mouth daily.     niacin 1000 MG CR tablet  Commonly known as:  NIASPAN  Take 2 tablets (2,000 mg total) by mouth at bedtime. Take an aspirin before taking the niaspan tablet     nystatin cream  Commonly known as:  MYCOSTATIN  Apply topically 2 (two) times daily.     simvastatin 40 MG tablet  Commonly known as:  ZOCOR  Take 1 tablet (40 mg total) by mouth at bedtime.     Testosterone 20.25 MG/1.25GM (1.62%) Gel  Commonly known as:  ANDROGEL  Place 4 application onto the skin daily. Dispense 1 month supply     venlafaxine XR 75 MG 24 hr capsule  Commonly known as:  EFFEXOR-XR  TAKE THREE (3) CAPSULES BY MOUTH DAILY

## 2013-04-25 NOTE — Progress Notes (Signed)
Pre-visit discussion using our clinic review tool. No additional management support is needed unless otherwise documented below in the visit note.  

## 2013-05-25 ENCOUNTER — Other Ambulatory Visit: Payer: Self-pay | Admitting: Family Medicine

## 2013-05-25 DIAGNOSIS — E109 Type 1 diabetes mellitus without complications: Secondary | ICD-10-CM | POA: Diagnosis not present

## 2013-05-25 DIAGNOSIS — N390 Urinary tract infection, site not specified: Secondary | ICD-10-CM | POA: Diagnosis not present

## 2013-05-25 DIAGNOSIS — R300 Dysuria: Secondary | ICD-10-CM | POA: Diagnosis not present

## 2013-05-25 DIAGNOSIS — R5081 Fever presenting with conditions classified elsewhere: Secondary | ICD-10-CM | POA: Diagnosis not present

## 2013-06-06 ENCOUNTER — Encounter: Payer: Self-pay | Admitting: Internal Medicine

## 2013-06-06 ENCOUNTER — Ambulatory Visit (INDEPENDENT_AMBULATORY_CARE_PROVIDER_SITE_OTHER): Payer: Medicare Other | Admitting: Internal Medicine

## 2013-06-06 VITALS — BP 118/60 | HR 89 | Temp 97.6°F | Resp 12 | Wt 245.0 lb

## 2013-06-06 DIAGNOSIS — E1049 Type 1 diabetes mellitus with other diabetic neurological complication: Secondary | ICD-10-CM | POA: Diagnosis not present

## 2013-06-06 DIAGNOSIS — E1065 Type 1 diabetes mellitus with hyperglycemia: Principal | ICD-10-CM

## 2013-06-06 LAB — HEMOGLOBIN A1C: Hgb A1c MFr Bld: 12.8 % — ABNORMAL HIGH (ref 4.6–6.5)

## 2013-06-06 NOTE — Progress Notes (Signed)
Patient ID: [name], male   DOB: [birth year], 68 y.o.   [name]: [id number]  HPI: [name] is a 68 y.o.-year-old male, returning for f/u for DM2, dx 1997, insulin-dependent since 2010, uncontrolled, with complications (CKD stage 3, PN).   He had a UTI >> started Abx >> now finishing course. Sugars increased with this.   Last hemoglobin A1c was: Lab Results  Component Value Date   HGBA1C 12.9* 12/12/2012   HGBA1C 9.6* 08/05/2011   HGBA1C 9.6* 04/27/2011  He believes his HbA1c increased as his diet worsened.  Pt was on a regimen of: - NPH (Novolin N) 70 units qhs - Novolin R 100 units hs  He is now on: Type of insulin Breakfast Lunch Dinner  Humulin R 20 25 25   Humulin N 30  30   Pt checks his sugars once a day (forgot log) and they are: - am: 150-170 >> 250 - 2h after b'fast: n/c - before lunch: n/c - 2h after lunch: n/c >> 300s - before dinner: n/c - 2h after dinner: n/c - bedtime: n/c  - nighttime: 60s, wakes him up >> not recently  No lows; he has hypoglycemia awareness at 100.  Highest sugar was Hi x 2 (french fries)  Pt's meals are (he reduced bread, uses whole wheat now): - Breakfast: 2 slices buttered toast - Lunch: ham sandwich + chips - Dinner: meat + veggies + starch - Snacks: 4-5 x a day  - has CKD, last BUN/creatinine:  Lab Results  Component Value Date   BUN 23 12/12/2012   CREATININE 1.7* 12/12/2012  Last ACR 4 on 12/12/2012. On Lisinopril 20.  - last set of lipids: Lab Results  Component Value Date   CHOL 223* 12/12/2012   HDL 38.90* 12/12/2012   LDLCALC 43 04/24/2009   LDLDIRECT 54.7 12/12/2012   TRIG 1001.0* 12/12/2012   CHOLHDL 6 12/12/2012  On Zocor 40. - last eye exam was in 2013. No DR.  -+ numbness and tingling in his feet, especially at night.  I reviewed pt's medications, allergies, PMH, social hx, family hx and no changes required, except as mentioned above.   ROS: Constitutional: no weight gain/loss, no fatigue, no subjective  hyperthermia/hypothermia Eyes: no blurry vision, no xerophthalmia ENT: no sore throat, no nodules palpated in throat, no dysphagia/odynophagia, + tinnitus, + decreased hearing Cardiovascular: no CP/SOB/no palpitations/leg swelling Respiratory: no cough/SOB Gastrointestinal: no N/V/D/no C/+ heartburn Musculoskeletal: no muscle/joint aches Skin: no rashes, + easy bruising Neurological: no tremors/numbness/tingling/dizziness + diff with erections, + low libido  PE: BP 118/60  Pulse 89  Temp(Src) 97.6 F (36.4 C) (Oral)  Resp 12  Wt 245 lb (111.131 kg)  SpO2 95% Wt Readings from Last 3 Encounters:  06/06/13 245 lb (111.131 kg)  04/25/13 244 lb 12 oz (111.018 kg)  03/28/13 247 lb 8 oz (112.265 kg)   Constitutional: overweight, in NAD Eyes: PERRLA, EOMI, no exophthalmos ENT: moist mucous membranes, no thyromegaly, no cervical lymphadenopathy, + tinnitus Cardiovascular: RRR, No MRG Respiratory: CTA B Gastrointestinal: abdomen soft, NT, ND, BS+ Musculoskeletal: no deformities, strength intact in all 4 Skin: moist, warm, no rashes  ASSESSMENT: 1. DM2, insulin-dependent, uncontrolled, with complications - CKD stage 3 - PN  PLAN:  1. Patient with long-standing, recently more uncontrolled diabetes, now on a tid NPH and regular insulin regimen (see below) since last visit. Sugars even higher than before. We will adjust the regimen but I advised him to call me before next visit if the sugars  stay high. - I suggested to:  Patient Instructions  Please change the insulin regimen as follows: Type of insulin Breakfast Lunch Dinner  Humulin R 20 >> 30 25 >> 35 25 >> 30  Humulin N 30 >> 40  30 >> 35  Please call with sugars consistently <90 and >200.  Please return in 1 month with your sugar log.   - he does not have problems mixing the 2 insulins. - continue checking sugars at different times of the day - check 3 times a day, rotating checks - given new sugar logs  - advised to  schedule a new eye exam Return in about 1 month (around 07/04/2013).   Office Visit on 06/06/2013  Component Date Value Ref Range Status  . Hemoglobin A1C 06/06/2013 12.8* 4.6 - 6.5 % Final   Glycemic Control Guidelines for People with Diabetes:Non Diabetic:  <6%Goal of Therapy: <7%Additional Action Suggested:  >8%    Still very poor control. See plan above, will increase total daily insulin dose by ~33%.

## 2013-06-06 NOTE — Patient Instructions (Signed)
Patient Instructions  Please change the insulin regimen as follows: Type of insulin Breakfast Lunch Dinner  Humulin R 20 >> 30 25 >> 35 25 >> 30  Humulin N 30 >> 40  30 >> 35  Please call with sugars consistently <90 and >200.  Please return in 1 month with your sugar log.

## 2013-06-18 ENCOUNTER — Ambulatory Visit: Payer: Medicare Other | Admitting: Family Medicine

## 2013-06-18 DIAGNOSIS — Z0289 Encounter for other administrative examinations: Secondary | ICD-10-CM

## 2013-06-20 ENCOUNTER — Ambulatory Visit: Payer: Medicare Other | Admitting: Pulmonary Disease

## 2013-06-21 ENCOUNTER — Ambulatory Visit: Payer: Medicare Other | Admitting: Pulmonary Disease

## 2013-07-10 ENCOUNTER — Encounter: Payer: Self-pay | Admitting: Pulmonary Disease

## 2013-07-10 ENCOUNTER — Ambulatory Visit (INDEPENDENT_AMBULATORY_CARE_PROVIDER_SITE_OTHER): Payer: Medicare Other | Admitting: Pulmonary Disease

## 2013-07-10 VITALS — BP 162/78 | HR 100 | Temp 97.8°F | Ht 72.0 in | Wt 247.0 lb

## 2013-07-10 DIAGNOSIS — G4733 Obstructive sleep apnea (adult) (pediatric): Secondary | ICD-10-CM

## 2013-07-10 NOTE — Assessment & Plan Note (Signed)
The patient is doing very well on CPAP, and is satisfied with his sleep and daytime alertness. I have asked him to keep up with his mask changes and supplies, and to work further on aggressive weight loss. He is to followup with me again in one year if doing well.

## 2013-07-10 NOTE — Progress Notes (Signed)
   Subjective:    Patient ID: [name], male    DOB: 10-27-1945, 68 y.o.   [name]: [id number]  HPI The patient comes in today for followup of his obstructive sleep apnea. He is wearing CPAP compliantly, and is having no issues with his mask fit or machine.   he feels that he sleeps well, and denies any daytime alertness issues.  Of note, his weight is down 8 pounds since last visit.   Review of Systems  Constitutional: Negative for fever and unexpected weight change.  HENT: Positive for congestion, postnasal drip, rhinorrhea, sinus pressure and sneezing. Negative for dental problem, ear pain, nosebleeds, sore throat and trouble swallowing.   Eyes: Negative for redness and itching.  Respiratory: Positive for shortness of breath. Negative for cough, chest tightness and wheezing.   Cardiovascular: Negative for palpitations and leg swelling.  Gastrointestinal: Negative for nausea and vomiting.  Genitourinary: Negative for dysuria.  Musculoskeletal: Negative for joint swelling.  Skin: Negative for rash.  Neurological: Negative for headaches.  Hematological: Does not bruise/bleed easily.  Psychiatric/Behavioral: Negative for dysphoric mood. The patient is not nervous/anxious.        Objective:   Physical Exam Overweight male in no acute distress Nose without purulence or discharge noted Neck without lymphadenopathy or thyromegaly No skin breakdown or pressure necrosis from the CPAP mask Lower extremities without significant edema, no cyanosis Alert and oriented, moves all 4 extremities.       Assessment & Plan:

## 2013-07-10 NOTE — Patient Instructions (Signed)
Continue with cpap, and keep up with mask changes and supplies. Work on weight loss followup with me again in one year.  

## 2013-07-15 ENCOUNTER — Other Ambulatory Visit: Payer: Self-pay | Admitting: Family Medicine

## 2013-08-08 ENCOUNTER — Encounter: Payer: Self-pay | Admitting: Internal Medicine

## 2013-08-08 ENCOUNTER — Ambulatory Visit (INDEPENDENT_AMBULATORY_CARE_PROVIDER_SITE_OTHER): Payer: Medicare Other | Admitting: Internal Medicine

## 2013-08-08 VITALS — BP 128/72 | HR 112 | Temp 98.1°F | Resp 12 | Wt 238.0 lb

## 2013-08-08 DIAGNOSIS — E1065 Type 1 diabetes mellitus with hyperglycemia: Principal | ICD-10-CM

## 2013-08-08 DIAGNOSIS — E1049 Type 1 diabetes mellitus with other diabetic neurological complication: Secondary | ICD-10-CM

## 2013-08-08 NOTE — Patient Instructions (Addendum)
Please continue current regimen: Type of insulin Breakfast Lunch Dinner  Humulin R 30 35 30  Humulin N 40  35  Please inject 30 min before every main meal. Do not skip doses. Check sugars at least 3x a day.  Please return in 1.5 months with your sugar log.

## 2013-08-08 NOTE — Progress Notes (Signed)
Patient ID: [name], male   DOB: 02-Apr-1946, 68 y.o.   [name]: [id number]  HPI: [name] is a 68 y.o.-year-old male, returning for f/u for DM2, dx 1997, insulin-dependent since 2010, uncontrolled, with complications (CKD stage 3, PN). Last visit was 2 mo ago.  Last hemoglobin A1c was: Lab Results  Component Value Date   HGBA1C 12.8* 06/06/2013   HGBA1C 12.9* 12/12/2012   HGBA1C 9.6* 08/05/2011  He believes his HbA1c increased as his diet worsened.  Pt was on a regimen of: - NPH (Novolin N) 70 units qhs - Novolin R 100 units hs  He is now on: Type of insulin Breakfast Lunch Dinner  Humulin R 20 >> 30 25 >> 35 25 >> 30  Humulin N 30 >> 40  30 >> 35  He forgets to take the midday insulin at least 3x a week.  "If I take the insulin as I should, it works"   At the beginning of the month >> went to visit his father in [place] >> forgot his insulins >> took his father's Lantus 30 tid x 5 days >> his sugars stayed in the 180s-200s.  Pt checks his sugars once a day in am (forgot log) and they are: - am: 150-170 >> 250 >> 130-140 (when takes the insulin correctly) - 2h after b'fast: n/c  - before lunch: n/c - 2h after lunch: n/c >> 300s >> n/c - before dinner: n/c - 2h after dinner: n/c - bedtime: n/c  - nighttime: 60s, wakes him up >> not recently  No lows; he has hypoglycemia awareness at 100.  Highest sugar was Hi.  Pt's meals are (he reduced bread, uses whole wheat now): - Breakfast: 2 slices buttered toast - Lunch: ham sandwich + chips - Dinner: meat + veggies + starch - Snacks: 4-5 x a day  - has CKD, last BUN/creatinine:  Lab Results  Component Value Date   BUN 23 12/12/2012   CREATININE 1.7* 12/12/2012  Last ACR 4 on 12/12/2012. On Lisinopril 20.  - last set of lipids: Lab Results  Component Value Date   CHOL 223* 12/12/2012   HDL 38.90* 12/12/2012   LDLCALC 43 04/24/2009   LDLDIRECT 54.7 12/12/2012   TRIG 1001.0* 12/12/2012   CHOLHDL 6 12/12/2012  On Zocor 40. - last  eye exam was in 2013. No DR.  -+ numbness and tingling in his feet, especially at night.  I reviewed pt's medications, allergies, PMH, social hx, family hx and no changes required, except as mentioned above.   ROS: Constitutional: no weight gain/loss, no fatigue, no subjective hyperthermia/hypothermia, + excessive urination, + nocturia Eyes: no blurry vision, no xerophthalmia ENT: no sore throat, no nodules palpated in throat, no dysphagia/odynophagia, + tinnitus Cardiovascular: no CP/SOB/no palpitations/+ leg swelling Respiratory: no cough/SOB Gastrointestinal: no N/V/D/no C/+ heartburn Musculoskeletal: + muscle/no joint aches Skin: no rashes Neurological: no tremors/numbness/tingling/dizziness + diff with erections, + low libido  PE: BP 128/72  Pulse 112  Temp(Src) 98.1 F (36.7 C) (Oral)  Resp 12  Wt 238 lb (107.956 kg)  SpO2 96% Wt Readings from Last 3 Encounters:  08/08/13 238 lb (107.956 kg)  07/10/13 247 lb (112.038 kg)  06/06/13 245 lb (111.131 kg)   Constitutional: overweight, in NAD Eyes: PERRLA, EOMI, no exophthalmos ENT: moist mucous membranes, no thyromegaly, no cervical lymphadenopathy, + tinnitus Cardiovascular: RRR, No MRG Respiratory: CTA B Gastrointestinal: abdomen soft, NT, ND, BS+ Musculoskeletal: no deformities, strength intact in all 4 Skin: moist, warm, no  rashes  ASSESSMENT: 1. DM2, insulin-dependent, uncontrolled, with complications - CKD stage 3 - PN  PLAN:  1. Patient with long-standing, recently more uncontrolled diabetes, now on a basal-bolus NPH and regular insulin regimen (see below). Not checking sugars consistently and forgetting many insulin doses... We discussed about the fact that he needs to take responsibility for his DM control before he develops complications from it. - I suggested to:    Patient Instructions   Please continue current regimen: Type of insulin Breakfast Lunch Dinner  Humulin R 30 35 30  Humulin N 40  35   Please inject 30 min before every main meal. Do not skip doses. Check sugars at least 3x a day.  Please return in 1.5 months with your sugar log.   - he does not have problems mixing the 2 insulins. They are cheap: $24 per bottle from [place], while they were $200 at [facility] - continue checking sugars at different times of the day - check 3 times a day, rotating checks - given new sugar logs  - advised to schedule a new eye exam >>  Coming up in 10/2013

## 2013-08-21 ENCOUNTER — Ambulatory Visit: Payer: Medicare Other | Admitting: Family Medicine

## 2013-08-22 ENCOUNTER — Encounter: Payer: Self-pay | Admitting: Family Medicine

## 2013-08-22 ENCOUNTER — Ambulatory Visit (INDEPENDENT_AMBULATORY_CARE_PROVIDER_SITE_OTHER): Payer: Medicare Other | Admitting: Family Medicine

## 2013-08-22 VITALS — BP 124/64 | HR 80 | Temp 98.2°F | Wt 246.5 lb

## 2013-08-22 DIAGNOSIS — M549 Dorsalgia, unspecified: Secondary | ICD-10-CM | POA: Diagnosis not present

## 2013-08-22 NOTE — Assessment & Plan Note (Addendum)
D/w pt re: tylenol, heat and home exercises.   Handout given. With h/o falls, refer for PT.  He agrees.  No need to image today.  D/w pt.

## 2013-08-22 NOTE — Patient Instructions (Signed)
Rosaria Ferries will call about your referral to PT. Tylenol 500mg /tab.  1-2 tabs up to 3 times a day.  Use a heating pad as needed.   Stretch gently.  Take care.

## 2013-08-22 NOTE — Progress Notes (Signed)
Pre visit review using our clinic review tool, if applicable. No additional management support is needed unless otherwise documented below in the visit note.  R lower back pain.  Near the belt line, on the R side.  Pain worse over the last 3 weeks approximately.  He had trouble getting up out of a chair, worse in the last 6 months. No radicular pain.  No L sided pain.  Tried ibuprofen and that helps some.  Hadn't tried heat or ice.  No dysuria.  No fevers.  No abd pain.    Using a cane helps some.  H/o falls, fell today at home when he lost his balance.  No LOC.    Not using indocin unless he has gout, not used recently.    Meds, vitals, and allergies reviewed.   ROS: See HPI.  Otherwise, noncontributory.  nad ncat Mmm rrr ctab abd soft, not ttp Back not ttp in midline, muscle spasms noted on the R lower back SLR leg S/S wnl for the BLE

## 2013-08-31 DIAGNOSIS — M545 Low back pain, unspecified: Secondary | ICD-10-CM | POA: Diagnosis not present

## 2013-09-11 DIAGNOSIS — M545 Low back pain, unspecified: Secondary | ICD-10-CM | POA: Diagnosis not present

## 2013-09-13 DIAGNOSIS — M545 Low back pain, unspecified: Secondary | ICD-10-CM | POA: Diagnosis not present

## 2013-09-18 ENCOUNTER — Ambulatory Visit: Payer: Medicare Other | Admitting: Internal Medicine

## 2013-09-18 DIAGNOSIS — M545 Low back pain, unspecified: Secondary | ICD-10-CM | POA: Diagnosis not present

## 2013-09-20 DIAGNOSIS — M545 Low back pain, unspecified: Secondary | ICD-10-CM | POA: Diagnosis not present

## 2013-09-25 ENCOUNTER — Ambulatory Visit: Payer: Medicare Other | Admitting: Internal Medicine

## 2013-09-25 ENCOUNTER — Other Ambulatory Visit: Payer: Self-pay | Admitting: Family Medicine

## 2013-09-27 DIAGNOSIS — M545 Low back pain, unspecified: Secondary | ICD-10-CM | POA: Diagnosis not present

## 2013-10-02 ENCOUNTER — Ambulatory Visit: Payer: Medicare Other | Admitting: Internal Medicine

## 2013-10-28 ENCOUNTER — Other Ambulatory Visit: Payer: Self-pay | Admitting: Family Medicine

## 2013-10-30 ENCOUNTER — Ambulatory Visit: Payer: Medicare Other | Admitting: Internal Medicine

## 2013-11-08 ENCOUNTER — Encounter (HOSPITAL_COMMUNITY): Payer: Self-pay | Admitting: Emergency Medicine

## 2013-11-08 ENCOUNTER — Emergency Department (HOSPITAL_COMMUNITY)
Admission: EM | Admit: 2013-11-08 | Discharge: 2013-11-08 | Disposition: A | Discharge status: Home / Self Care | Payer: Medicare Other | Attending: Emergency Medicine | Admitting: Emergency Medicine

## 2013-11-08 ENCOUNTER — Emergency Department (HOSPITAL_COMMUNITY): Payer: Medicare Other

## 2013-11-08 DIAGNOSIS — F329 Major depressive disorder, single episode, unspecified: Secondary | ICD-10-CM | POA: Diagnosis not present

## 2013-11-08 DIAGNOSIS — Z860100 Personal history of colon polyps, unspecified: Secondary | ICD-10-CM | POA: Diagnosis not present

## 2013-11-08 DIAGNOSIS — R5381 Other malaise: Secondary | ICD-10-CM | POA: Insufficient documentation

## 2013-11-08 DIAGNOSIS — E785 Hyperlipidemia, unspecified: Secondary | ICD-10-CM | POA: Insufficient documentation

## 2013-11-08 DIAGNOSIS — Z9889 Other specified postprocedural states: Secondary | ICD-10-CM | POA: Diagnosis not present

## 2013-11-08 DIAGNOSIS — I10 Essential (primary) hypertension: Secondary | ICD-10-CM | POA: Diagnosis not present

## 2013-11-08 DIAGNOSIS — IMO0002 Reserved for concepts with insufficient information to code with codable children: Secondary | ICD-10-CM | POA: Diagnosis not present

## 2013-11-08 DIAGNOSIS — R5383 Other fatigue: Secondary | ICD-10-CM

## 2013-11-08 DIAGNOSIS — R079 Chest pain, unspecified: Secondary | ICD-10-CM | POA: Diagnosis not present

## 2013-11-08 DIAGNOSIS — Z792 Long term (current) use of antibiotics: Secondary | ICD-10-CM | POA: Insufficient documentation

## 2013-11-08 DIAGNOSIS — R42 Dizziness and giddiness: Secondary | ICD-10-CM | POA: Diagnosis not present

## 2013-11-08 DIAGNOSIS — Z7982 Long term (current) use of aspirin: Secondary | ICD-10-CM | POA: Diagnosis not present

## 2013-11-08 DIAGNOSIS — I129 Hypertensive chronic kidney disease with stage 1 through stage 4 chronic kidney disease, or unspecified chronic kidney disease: Secondary | ICD-10-CM | POA: Insufficient documentation

## 2013-11-08 DIAGNOSIS — E119 Type 2 diabetes mellitus without complications: Secondary | ICD-10-CM | POA: Diagnosis not present

## 2013-11-08 DIAGNOSIS — Z862 Personal history of diseases of the blood and blood-forming organs and certain disorders involving the immune mechanism: Secondary | ICD-10-CM | POA: Diagnosis not present

## 2013-11-08 DIAGNOSIS — F3289 Other specified depressive episodes: Secondary | ICD-10-CM | POA: Diagnosis not present

## 2013-11-08 DIAGNOSIS — K219 Gastro-esophageal reflux disease without esophagitis: Secondary | ICD-10-CM | POA: Insufficient documentation

## 2013-11-08 DIAGNOSIS — N183 Chronic kidney disease, stage 3 unspecified: Secondary | ICD-10-CM | POA: Diagnosis not present

## 2013-11-08 DIAGNOSIS — Z87891 Personal history of nicotine dependence: Secondary | ICD-10-CM | POA: Diagnosis not present

## 2013-11-08 DIAGNOSIS — F419 Anxiety disorder, unspecified: Secondary | ICD-10-CM

## 2013-11-08 DIAGNOSIS — F411 Generalized anxiety disorder: Secondary | ICD-10-CM | POA: Insufficient documentation

## 2013-11-08 DIAGNOSIS — Z79899 Other long term (current) drug therapy: Secondary | ICD-10-CM | POA: Insufficient documentation

## 2013-11-08 DIAGNOSIS — Z794 Long term (current) use of insulin: Secondary | ICD-10-CM | POA: Diagnosis not present

## 2013-11-08 LAB — BASIC METABOLIC PANEL
Anion gap: 15 (ref 5–15)
BUN: 15 mg/dL (ref 6–23)
CO2: 21 mEq/L (ref 19–32)
Calcium: 9 mg/dL (ref 8.4–10.5)
Chloride: 92 mEq/L — ABNORMAL LOW (ref 96–112)
Creatinine, Ser: 1.14 mg/dL (ref 0.50–1.35)
GFR calc Af Amer: 74 mL/min — ABNORMAL LOW (ref >90)
GFR, EST NON AFRICAN AMERICAN: 64 mL/min — AB (ref >90)
Glucose, Bld: 514 mg/dL — ABNORMAL HIGH (ref 70–99)
POTASSIUM: 4.5 meq/L (ref 3.7–5.3)
SODIUM: 128 meq/L — AB (ref 137–147)

## 2013-11-08 LAB — CBC
HEMATOCRIT: 37.6 % — AB (ref 39.0–52.0)
Hemoglobin: 13.4 g/dL (ref 13.0–17.0)
MCH: 30.7 pg (ref 26.0–34.0)
MCHC: 35.6 g/dL (ref 30.0–36.0)
MCV: 86 fL (ref 78.0–100.0)
Platelets: 161 10*3/uL (ref 150–400)
RBC: 4.37 MIL/uL (ref 4.22–5.81)
RDW: 12.6 % (ref 11.5–15.5)
WBC: 6.3 10*3/uL (ref 4.0–10.5)

## 2013-11-08 LAB — D-DIMER, QUANTITATIVE (NOT AT ARMC): D-Dimer, Quant: 0.32 ug/mL-FEU (ref 0.00–0.48)

## 2013-11-08 LAB — I-STAT TROPONIN, ED
TROPONIN I, POC: 0 ng/mL (ref 0.00–0.08)
Troponin i, poc: 0 ng/mL (ref 0.00–0.08)

## 2013-11-08 LAB — PRO B NATRIURETIC PEPTIDE: PRO B NATRI PEPTIDE: 38.9 pg/mL (ref 0–125)

## 2013-11-08 MED ORDER — SODIUM CHLORIDE 0.9 % IV BOLUS (SEPSIS)
1000.0000 mL | Freq: Once | INTRAVENOUS | Status: AC
  Administered 2013-11-08: 1000 mL via INTRAVENOUS

## 2013-11-08 NOTE — ED Notes (Signed)
EDP at bedside to discuss plan of care.

## 2013-11-08 NOTE — ED Notes (Signed)
Presents with left sided chest pain began while washing dishes associated with SOB and dizziness. Pain is described as sharp. Pain has changed to heaviness at this time. Nothing makes pain worse. Rest makes pain better.

## 2013-11-08 NOTE — Discharge Instructions (Signed)
Return to emergency room with any recurrence of your symptoms.  Chest Pain (Nonspecific) It is often hard to give a specific diagnosis for the cause of chest pain. There is always a chance that your pain could be related to something serious, such as a heart attack or a blood clot in the lungs. You need to follow up with your health care provider for further evaluation. CAUSES   Heartburn.  Pneumonia or bronchitis.  Anxiety or stress.  Inflammation around your heart (pericarditis) or lung (pleuritis or pleurisy).  A blood clot in the lung.  A collapsed lung (pneumothorax). It can develop suddenly on its own (spontaneous pneumothorax) or from trauma to the chest.  Shingles infection (herpes zoster virus). The chest wall is composed of bones, muscles, and cartilage. Any of these can be the source of the pain.  The bones can be bruised by injury.  The muscles or cartilage can be strained by coughing or overwork.  The cartilage can be affected by inflammation and become sore (costochondritis). DIAGNOSIS  Lab tests or other studies may be needed to find the cause of your pain. Your health care provider may have you take a test called an ambulatory electrocardiogram (ECG). An ECG records your heartbeat patterns over a 24-hour period. You may also have other tests, such as:  Transthoracic echocardiogram (TTE). During echocardiography, sound waves are used to evaluate how blood flows through your heart.  Transesophageal echocardiogram (TEE).  Cardiac monitoring. This allows your health care provider to monitor your heart rate and rhythm in real time.  Holter monitor. This is a portable device that records your heartbeat and can help diagnose heart arrhythmias. It allows your health care provider to track your heart activity for several days, if needed.  Stress tests by exercise or by giving medicine that makes the heart beat faster. TREATMENT   Treatment depends on what may be causing  your chest pain. Treatment may include:  Acid blockers for heartburn.  Anti-inflammatory medicine.  Pain medicine for inflammatory conditions.  Antibiotics if an infection is present.  You may be advised to change lifestyle habits. This includes stopping smoking and avoiding alcohol, caffeine, and chocolate.  You may be advised to keep your head raised (elevated) when sleeping. This reduces the chance of acid going backward from your stomach into your esophagus. Most of the time, nonspecific chest pain will improve within 2-3 days with rest and mild pain medicine.  HOME CARE INSTRUCTIONS   If antibiotics were prescribed, take them as directed. Finish them even if you start to feel better.  For the next few days, avoid physical activities that bring on chest pain. Continue physical activities as directed.  Do not use any tobacco products, including cigarettes, chewing tobacco, or electronic cigarettes.  Avoid drinking alcohol.  Only take medicine as directed by your health care provider.  Follow your health care provider's suggestions for further testing if your chest pain does not go away.  Keep any follow-up appointments you made. If you do not go to an appointment, you could develop lasting (chronic) problems with pain. If there is any problem keeping an appointment, call to reschedule. SEEK MEDICAL CARE IF:   Your chest pain does not go away, even after treatment.  You have a rash with blisters on your chest.  You have a fever. SEEK IMMEDIATE MEDICAL CARE IF:   You have increased chest pain or pain that spreads to your arm, neck, jaw, back, or abdomen.  You have shortness  of breath.  You have an increasing cough, or you cough up blood.  You have severe back or abdominal pain.  You feel nauseous or vomit.  You have severe weakness.  You faint.  You have chills. This is an emergency. Do not wait to see if the pain will go away. Get medical help at once. Call your  local emergency services (911 in [place] Do not drive yourself to the hospital. MAKE SURE YOU:   Understand these instructions.  Will watch your condition.  Will get help right away if you are not doing well or get worse. Document Released: 01/06/2005 Document Revised: 04/03/2013 Document Reviewed: 11/02/2007 [facility] Patient Information 2015 [place], [place]. This information is not intended to replace advice given to you by your health care provider. Make sure you discuss any questions you have with your health care provider.

## 2013-11-08 NOTE — ED Provider Notes (Signed)
CSN: [id number]     Arrival date & time 11/08/13  1644 History   First MD Initiated Contact with Patient 11/08/13 2200     Chief Complaint  Patient presents with  . Chest Pain      HPI  Patient presents with an episode of chest pain during the course of the day. He states that his grandson had her friend over. He overheard 68 year old "calling someone to 'score some weed' ".  He admits that he was upset and anxious.  He went into the kitchen to do some dishes.  He states he started feeling weak and shaky. He turned and felt like he was going to fall. He sat on the floor. He did not fully syncope. His chest became tight sharp pain in his anterior chest. He presents here. History of insulin-dependent diabetes and hypertension. Heart cath in 2013 showed only minimal right coronary disease.  Past Medical History  Diagnosis Date  . Depression   . Diabetes mellitus   . Hyperlipidemia   . Hypertension   . Osteoarthritis   . Allergic rhinitis due to pollen   . GERD (gastroesophageal reflux disease)   . Gout   . Diverticulosis   . Personal history of colonic polyps     1996  . Low testosterone   . Iron deficiency   . CKD (chronic kidney disease)   . Hypogonadism male 06/14/2012  . Chronic kidney disease, stage III (moderate) 12/19/2012   Past Surgical History  Procedure Laterality Date  . Cholecystectomy  1980  . Total hip arthroplasty  2009    Dr. Gladstone Lighter  . Total hip arthroplasty    . Cardiac catheterization  11/2011    [facility]   Family History  Problem Relation Age of Onset  . Diabetes Mother   . Diabetes Father   . Lung cancer Sister     lung  . Heart disease Maternal Grandfather   . COPD Sister   . Heart attack Sister   . Heart disease Sister    History  Substance Use Topics  . Smoking status: Former Smoker -- 3.00 packs/day for 35 years    Types: Cigarettes    Quit date: 04/12/2000  . Smokeless tobacco: Never Used  . Alcohol Use: No    Review of Systems   Constitutional: Negative for fever, chills, diaphoresis, appetite change and fatigue.  HENT: Negative for mouth sores, sore throat and trouble swallowing.   Eyes: Negative for visual disturbance.  Respiratory: Negative for cough, chest tightness, shortness of breath and wheezing.   Cardiovascular: Positive for chest pain.  Gastrointestinal: Negative for nausea, vomiting, abdominal pain, diarrhea and abdominal distention.  Endocrine: Negative for polydipsia, polyphagia and polyuria.  Genitourinary: Negative for dysuria, frequency and hematuria.  Musculoskeletal: Negative for gait problem.  Skin: Negative for color change, pallor and rash.  Neurological: Positive for dizziness and weakness. Negative for syncope, light-headedness and headaches.  Hematological: Does not bruise/bleed easily.  Psychiatric/Behavioral: Negative for behavioral problems and confusion.      Allergies  Tetracycline  Home Medications   Prior to Admission medications   Medication Sig Start Date End Date Taking? Authorizing Provider  acetaminophen (TYLENOL) 500 MG tablet Take 1,000 mg by mouth every 6 (six) hours as needed for moderate pain.   Yes Historical Provider, MD  ampicillin (PRINCIPEN) 500 MG capsule Take 1 capsule by mouth daily. 04/06/13  Yes Historical Provider, MD  aspirin 81 MG chewable tablet Chew 81 mg by mouth at bedtime.  Yes Historical Provider, MD  atenolol (TENORMIN) 100 MG tablet Take 1 tablet (100 mg total) by mouth daily. 12/18/12  Yes [name], MD  b complex vitamins tablet Take 1 tablet by mouth daily.     Yes Historical Provider, MD  cholecalciferol (VITAMIN D) 1000 UNITS tablet Take 1,000 Units by mouth daily.     Yes Historical Provider, MD  doxazosin (CARDURA) 8 MG tablet Take 0.5 tablets (4 mg total) by mouth at bedtime. 12/18/12  Yes Spencer Copland, MD  famotidine (PEPCID) 10 MG tablet Take 10 mg by mouth daily.     Yes Historical Provider, MD  fluticasone (FLONASE) 50 MCG/ACT  nasal spray Place 1 spray into both nostrils daily as needed for allergies or rhinitis.   Yes Historical Provider, MD  insulin NPH Human (HUMULIN N,NOVOLIN N) 100 UNIT/ML injection Inject 30 Units into the skin 3 (three) times daily.  03/21/13  Yes Philemon Kingdom, MD  insulin regular (NOVOLIN R,HUMULIN R) 100 units/mL injection Inject 30 Units into the skin 3 (three) times daily before meals.  03/21/13  Yes Philemon Kingdom, MD  lisinopril-hydrochlorothiazide (PRINZIDE,ZESTORETIC) 20-12.5 MG per tablet Take 1 tablet by mouth daily. 12/18/12  Yes Spencer Copland, MD  simvastatin (ZOCOR) 40 MG tablet TAKE ONE TABLET BY MOUTH EVERY NIGHT AT BEDTIME 09/25/13  Yes Spencer Copland, MD  simvastatin (ZOCOR) 40 MG tablet Take 40 mg by mouth at bedtime.   Yes Historical Provider, MD  venlafaxine XR (EFFEXOR-XR) 75 MG 24 hr capsule Take 75 mg by mouth 3 (three) times daily.   Yes Historical Provider, MD   BP 131/76  Pulse 98  Temp(Src) 97.8 F (36.6 C) (Oral)  Resp 19  SpO2 97% Physical Exam  Constitutional: He is oriented to person, place, and time. He appears well-developed and well-nourished. No distress.  HENT:  Head: Normocephalic.  Eyes: Conjunctivae are normal. Pupils are equal, round, and reactive to light. No scleral icterus.  Neck: Normal range of motion. Neck supple. No thyromegaly present.  Cardiovascular: Normal rate and regular rhythm.  Exam reveals no gallop and no friction rub.   No murmur heard. Pulmonary/Chest: Effort normal and breath sounds normal. No respiratory distress. He has no wheezes. He has no rales.  Abdominal: Soft. Bowel sounds are normal. He exhibits no distension. There is no tenderness. There is no rebound.  Musculoskeletal: Normal range of motion.  Neurological: He is alert and oriented to person, place, and time.  Skin: Skin is warm and dry. No rash noted.  Psychiatric: He has a normal mood and affect. His behavior is normal.    ED Course  Procedures (including  critical care time) Labs Review Labs Reviewed  CBC - Abnormal; Notable for the following:    HCT 37.6 (*)    All other components within normal limits  BASIC METABOLIC PANEL - Abnormal; Notable for the following:    Sodium 128 (*)    Chloride 92 (*)    Glucose, Bld 514 (*)    GFR calc non Af Amer 64 (*)    GFR calc Af Amer 74 (*)    All other components within normal limits  PRO B NATRIURETIC PEPTIDE  D-DIMER, QUANTITATIVE  I-STAT TROPOININ, ED  I-STAT TROPOININ, ED    Imaging Review Dg Chest 2 View  11/08/2013   CLINICAL DATA:  Intermittent left chest pain, progressively worsening.  EXAM: CHEST  2 VIEW  COMPARISON:  11/04/2012  FINDINGS: Thoracic spondylosis. Cardiac and mediastinal margins appear normal. No pleural effusion. Vague  frontal projection density projecting at the right lung base appears to probably represent soft tissues of the chest wall rather than a intrapulmonary process. No corresponding densities seen on the lateral projection.  IMPRESSION: 1. Vague density at the right lung base favors soft tissues of the chest wall. The lungs are thought to be clear. No acute findings. 2. Thoracic spondylosis.   Electronically Signed   By: Sherryl Barters M.D.   On: 11/08/2013 18:30     EKG Interpretation   Date/Time:  Thursday November 08 2013 16:48:59 EDT Ventricular Rate:  109 PR Interval:  156 QRS Duration: 80 QT Interval:  316 QTC Calculation: 425 R Axis:   75 Text Interpretation:  Sinus tachycardia Otherwise normal ECG Confirmed by  Jeneen Rinks  MD, Dutton ([id number]) on 11/08/2013 11:35:52 PM      MDM   Final diagnoses:  None    Patient with an episode of lightheadedness and anxiety after an emotional interactions with her teenage friend of his sons. EKG without change,  Normal Troponin on arrival, and 5 hours later. Normal d-dimer.  Afebrile, not hypoxemic. Appropriate for DC. His blood sugar was 514 here.  He prefers to use his insulin at home.  I offered insulin and  observation here, he politely declines.    Tanna Furry, MD 11/08/13 [phone]

## 2013-11-13 ENCOUNTER — Ambulatory Visit (INDEPENDENT_AMBULATORY_CARE_PROVIDER_SITE_OTHER): Payer: Medicare Other | Admitting: Family Medicine

## 2013-11-13 ENCOUNTER — Encounter: Payer: Self-pay | Admitting: Family Medicine

## 2013-11-13 VITALS — BP 116/62 | HR 79 | Temp 98.0°F | Wt 235.5 lb

## 2013-11-13 DIAGNOSIS — R55 Syncope and collapse: Secondary | ICD-10-CM

## 2013-11-13 DIAGNOSIS — E1065 Type 1 diabetes mellitus with hyperglycemia: Principal | ICD-10-CM

## 2013-11-13 DIAGNOSIS — N4829 Other inflammatory disorders of penis: Secondary | ICD-10-CM

## 2013-11-13 DIAGNOSIS — H6980 Other specified disorders of Eustachian tube, unspecified ear: Secondary | ICD-10-CM

## 2013-11-13 DIAGNOSIS — E1049 Type 1 diabetes mellitus with other diabetic neurological complication: Secondary | ICD-10-CM

## 2013-11-13 DIAGNOSIS — H6990 Unspecified Eustachian tube disorder, unspecified ear: Secondary | ICD-10-CM

## 2013-11-13 MED ORDER — INSULIN NPH (HUMAN) (ISOPHANE) 100 UNIT/ML ~~LOC~~ SUSP: 35.0000 [IU] | Freq: Three times a day (TID) | SUBCUTANEOUS | Status: DC

## 2013-11-13 MED ORDER — INSULIN REGULAR HUMAN 100 UNIT/ML IJ SOLN: 35.0000 [IU] | Freq: Three times a day (TID) | INTRAMUSCULAR | Status: DC

## 2013-11-13 MED ORDER — NYSTATIN 100000 UNIT/GM EX CREA: TOPICAL_CREAM | Freq: Two times a day (BID) | CUTANEOUS | Status: DC | PRN

## 2013-11-13 NOTE — Patient Instructions (Signed)
Increase your insulin to 35 units per shot with each meal. Use the nystatin cream as needed.  Keep using flonase and use nasal saline.  Gently try to pop your ears. That should get better.   Take care.

## 2013-11-13 NOTE — Progress Notes (Signed)
Pre visit review using our clinic review tool, if applicable. No additional management support is needed unless otherwise documented below in the visit note.  11/07/13.  Was in the kitchen doing dishes.  He was upset about a family member, worried about his grandson.  He wasn't sure about fully passing out.  Did have CP, pain on the top of his head, had some L arm pain.  The sx all resolved, chest pain lasted about 30 minutes.   Chest pressure lasted 3-4 hours, until he was at the hospital.  Seen at ER, notes reviewed. Sugar was up but other labs were as expected.    Chronically with higher sugar and lower Na, likely from sugar elevation.  D/w pt.   Labs reviewed.   Foreskin irritation noted.  Prev treated with topical antifungal. D/w pt about sugar control and current sx.   B ear sx, some ringing, can't pop R ear, hears an echo in R ear.  No pain.   PMH and SH reviewed  ROS: See HPI, otherwise noncontributory.  Meds, vitals, and allergies reviewed.   GEN: nad, alert and oriented HEENT: mucous membranes moist, TM wnl but B ETD noted.   NECK: supple w/o LA CV: rrr.  PULM: ctab, no inc wob ABD: soft, +bs EXT: no edema SKIN: no acute rash Foreskin with chronic irritation and likely fungal elements, can retract foreskin.

## 2013-11-14 DIAGNOSIS — N4829 Other inflammatory disorders of penis: Secondary | ICD-10-CM | POA: Insufficient documentation

## 2013-11-14 DIAGNOSIS — R55 Syncope and collapse: Secondary | ICD-10-CM | POA: Insufficient documentation

## 2013-11-14 DIAGNOSIS — H6980 Other specified disorders of Eustachian tube, unspecified ear: Secondary | ICD-10-CM | POA: Insufficient documentation

## 2013-11-14 NOTE — Assessment & Plan Note (Signed)
No sign of cardiac involvement, sugar wasn't low at the time. Unclear if from hyperglycemia +/- recent stressful situation.  D/w pt.  Still needs sugar control.  Okay for outpatient fu.  No cardiac sx at this point, no other episodes.  >25 minutes spent in face to face time with patient, >50% spent in counselling or coordination of care.

## 2013-11-14 NOTE — Assessment & Plan Note (Signed)
D/w pt.  Gently try to pop his ears.  Continue flonase, add on nasal saline.

## 2013-11-14 NOTE — Assessment & Plan Note (Signed)
D/w pt about inc insulin to 35 units each with each meal, then f/u with endo.  He agrees.

## 2013-11-14 NOTE — Assessment & Plan Note (Signed)
Use nystatin, needs sugar control, d/w pt.

## 2013-12-11 ENCOUNTER — Other Ambulatory Visit: Payer: Self-pay | Admitting: Family Medicine

## 2013-12-11 DIAGNOSIS — Z79899 Other long term (current) drug therapy: Secondary | ICD-10-CM

## 2013-12-11 DIAGNOSIS — E1129 Type 2 diabetes mellitus with other diabetic kidney complication: Secondary | ICD-10-CM

## 2013-12-11 DIAGNOSIS — E785 Hyperlipidemia, unspecified: Secondary | ICD-10-CM

## 2013-12-11 DIAGNOSIS — Z125 Encounter for screening for malignant neoplasm of prostate: Secondary | ICD-10-CM

## 2013-12-12 ENCOUNTER — Other Ambulatory Visit (INDEPENDENT_AMBULATORY_CARE_PROVIDER_SITE_OTHER): Payer: Medicare Other

## 2013-12-12 DIAGNOSIS — Z79899 Other long term (current) drug therapy: Secondary | ICD-10-CM | POA: Diagnosis not present

## 2013-12-12 DIAGNOSIS — I10 Essential (primary) hypertension: Secondary | ICD-10-CM

## 2013-12-12 DIAGNOSIS — Z125 Encounter for screening for malignant neoplasm of prostate: Secondary | ICD-10-CM

## 2013-12-12 DIAGNOSIS — E785 Hyperlipidemia, unspecified: Secondary | ICD-10-CM

## 2013-12-12 DIAGNOSIS — E1129 Type 2 diabetes mellitus with other diabetic kidney complication: Secondary | ICD-10-CM | POA: Diagnosis not present

## 2013-12-12 LAB — BASIC METABOLIC PANEL
BUN: 16 mg/dL (ref 6–23)
CHLORIDE: 102 meq/L (ref 96–112)
CO2: 28 mEq/L (ref 19–32)
Calcium: 8.9 mg/dL (ref 8.4–10.5)
Creatinine, Ser: 1.1 mg/dL (ref 0.4–1.5)
GFR: 67.88 mL/min (ref >60.00)
GLUCOSE: 147 mg/dL — AB (ref 70–99)
POTASSIUM: 3.9 meq/L (ref 3.5–5.1)
Sodium: 136 mEq/L (ref 135–145)

## 2013-12-12 LAB — CBC WITH DIFFERENTIAL/PLATELET
BASOS PCT: 0.5 % (ref 0.0–3.0)
Basophils Absolute: 0 10*3/uL (ref 0.0–0.1)
EOS PCT: 2.3 % (ref 0.0–5.0)
Eosinophils Absolute: 0.1 10*3/uL (ref 0.0–0.7)
HCT: 35.6 % — ABNORMAL LOW (ref 39.0–52.0)
Hemoglobin: 12.3 g/dL — ABNORMAL LOW (ref 13.0–17.0)
Lymphocytes Relative: 31.4 % (ref 12.0–46.0)
Lymphs Abs: 1.4 10*3/uL (ref 0.7–4.0)
MCHC: 34.5 g/dL (ref 30.0–36.0)
MCV: 89.3 fl (ref 78.0–100.0)
Monocytes Absolute: 0.5 10*3/uL (ref 0.1–1.0)
Monocytes Relative: 11.7 % (ref 3.0–12.0)
Neutro Abs: 2.5 10*3/uL (ref 1.4–7.7)
Neutrophils Relative %: 54.1 % (ref 43.0–77.0)
Platelets: 156 10*3/uL (ref 150.0–400.0)
RBC: 3.99 Mil/uL — AB (ref 4.22–5.81)
RDW: 13.8 % (ref 11.5–15.5)
WBC: 4.6 10*3/uL (ref 4.0–10.5)

## 2013-12-12 LAB — MICROALBUMIN / CREATININE URINE RATIO
Creatinine,U: 37.9 mg/dL
MICROALB/CREAT RATIO: 10.5 mg/g (ref 0.0–30.0)
Microalb, Ur: 4 mg/dL — ABNORMAL HIGH (ref 0.0–1.9)

## 2013-12-12 LAB — HEPATIC FUNCTION PANEL
ALT: 20 U/L (ref 0–53)
AST: 16 U/L (ref 0–37)
Albumin: 3.6 g/dL (ref 3.5–5.2)
Alkaline Phosphatase: 69 U/L (ref 39–117)
BILIRUBIN TOTAL: 0.6 mg/dL (ref 0.2–1.2)
Bilirubin, Direct: 0.1 mg/dL (ref 0.0–0.3)
Total Protein: 6.4 g/dL (ref 6.0–8.3)

## 2013-12-12 LAB — LIPID PANEL
CHOL/HDL RATIO: 5
CHOLESTEROL: 159 mg/dL (ref 0–200)
HDL: 29.3 mg/dL — AB (ref >39.00)
NonHDL: 129.7
Triglycerides: 635 mg/dL — ABNORMAL HIGH (ref 0.0–149.0)
VLDL: 127 mg/dL — AB (ref 0.0–40.0)

## 2013-12-12 LAB — LDL CHOLESTEROL, DIRECT: Direct LDL: 64.1 mg/dL

## 2013-12-12 LAB — HEMOGLOBIN A1C: HEMOGLOBIN A1C: 12 % — AB (ref 4.6–6.5)

## 2013-12-12 LAB — PSA: PSA: 0.38 ng/mL (ref 0.10–4.00)

## 2013-12-19 ENCOUNTER — Encounter: Payer: Self-pay | Admitting: Family Medicine

## 2013-12-19 ENCOUNTER — Ambulatory Visit (INDEPENDENT_AMBULATORY_CARE_PROVIDER_SITE_OTHER): Payer: Medicare Other | Admitting: Family Medicine

## 2013-12-19 VITALS — BP 128/70 | HR 78 | Temp 97.9°F | Ht 71.5 in | Wt 238.5 lb

## 2013-12-19 DIAGNOSIS — Z23 Encounter for immunization: Secondary | ICD-10-CM

## 2013-12-19 DIAGNOSIS — E1065 Type 1 diabetes mellitus with hyperglycemia: Secondary | ICD-10-CM

## 2013-12-19 DIAGNOSIS — E1049 Type 1 diabetes mellitus with other diabetic neurological complication: Secondary | ICD-10-CM | POA: Diagnosis not present

## 2013-12-19 DIAGNOSIS — E1142 Type 2 diabetes mellitus with diabetic polyneuropathy: Secondary | ICD-10-CM

## 2013-12-19 DIAGNOSIS — Z0000 Encounter for general adult medical examination without abnormal findings: Secondary | ICD-10-CM

## 2013-12-19 DIAGNOSIS — E785 Hyperlipidemia, unspecified: Secondary | ICD-10-CM

## 2013-12-19 DIAGNOSIS — M1990 Unspecified osteoarthritis, unspecified site: Secondary | ICD-10-CM

## 2013-12-19 DIAGNOSIS — Z87891 Personal history of nicotine dependence: Secondary | ICD-10-CM

## 2013-12-19 DIAGNOSIS — G4733 Obstructive sleep apnea (adult) (pediatric): Secondary | ICD-10-CM

## 2013-12-19 DIAGNOSIS — N183 Chronic kidney disease, stage 3 unspecified: Secondary | ICD-10-CM | POA: Diagnosis not present

## 2013-12-19 DIAGNOSIS — E1349 Other specified diabetes mellitus with other diabetic neurological complication: Secondary | ICD-10-CM | POA: Diagnosis not present

## 2013-12-19 DIAGNOSIS — I10 Essential (primary) hypertension: Secondary | ICD-10-CM

## 2013-12-19 DIAGNOSIS — E669 Obesity, unspecified: Secondary | ICD-10-CM

## 2013-12-19 DIAGNOSIS — E0842 Diabetes mellitus due to underlying condition with diabetic polyneuropathy: Secondary | ICD-10-CM

## 2013-12-19 NOTE — Patient Instructions (Signed)
zostavax (SHINGLES VACCINE) -- go to one of the pharmacies that has a sign that says "Shingles Vaccines" here. You can go there and they will check with your insurance and they can give it to you there.

## 2013-12-19 NOTE — Progress Notes (Signed)
Pre visit review using our clinic review tool, if applicable. No additional management support is needed unless otherwise documented below in the visit note. 

## 2013-12-19 NOTE — Progress Notes (Signed)
Dr. Frederico Hamman T. Torra Pala, MD, [facility] [place], [address] Phone: [phone] Fax: [phone]  12/19/2013  Patient: [name], [name]: [id number], DOB: 19-Jul-1945, 68 y.o.  Primary Physician:  [name], MD  Chief Complaint: Annual Exam  Subjective:   [name] is a 68 y.o. pleasant patient who presents for a medicare wellness examination:  Preventative Health Maintenance Visit:  Health Maintenance Summary Reviewed and updated, unless pt declines services.  Tobacco History Reviewed. Alcohol: No concerns, no excessive use Exercise Habits: minimal STD concerns: no risk or activity to increase risk Drug Use: None Encouraged self-testicular check  Health Maintenance  Topic Date Due  . Zostavax  11/06/2005  . Pneumococcal Polysaccharide Vaccine Age 21 And Over  11/07/2010  . Ophthalmology Exam  12/20/2014 (Originally 10/10/2013)  . Hemoglobin A1c  06/12/2014  . Influenza Vaccine  11/11/2014  . Urine Microalbumin  12/13/2014  . Foot Exam  12/21/2014  . Tetanus/tdap  04/12/2017  . Colonoscopy  09/15/2020    Immunization History  Administered Date(s) Administered  . Influenza Split 12/29/2010  . Influenza Whole 01/27/2009, 02/05/2010  . Influenza, Seasonal, Injecte, Preservative Fre 06/14/2012  . Influenza,inj,Quad PF,36+ Mos 12/18/2012, 12/19/2013  . Pneumococcal Conjugate-13 12/19/2013  . Pneumococcal Polysaccharide-23 10/25/2008  . Td 04/13/2007   Diabetes Mellitus: Tolerating Medications: yes - missing multiple doses of insulin Compliance with diet: fair Exercise: minimal / intermittent Avg blood sugars at home: scattered Foot problems: none Hypoglycemia: none No nausea, vomitting, blurred vision, polyuria.  Lab Results  Component Value Date   HGBA1C 12.0* 12/12/2013   HGBA1C 12.8* 06/06/2013   HGBA1C 12.9* 12/12/2012   Lab Results  Component Value Date   MICROALBUR 4.0* 12/12/2013   LDLCALC 43 04/24/2009   CREATININE 1.1 12/12/2013    Wt Readings from Last 3 Encounters:  12/20/13 236 lb (107.049 kg)  12/19/13 238 lb 8 oz (108.183 kg)  11/13/13 235 lb 8 oz (106.822 kg)    Body mass index is 32.8 kg/(m^2).   HTN: Tolerating all medications without side effects Stable and at goal No CP, no sob. No HA.  BP Readings from Last 3 Encounters:  12/20/13 124/84  12/19/13 128/70  11/13/13 536/64    Basic Metabolic Panel:    Component Value Date/Time   NA 136 12/12/2013 0902   NA 130* 11/25/2011 1119   K 3.9 12/12/2013 0902   CL 102 12/12/2013 0902   CO2 28 12/12/2013 0902   BUN 16 12/12/2013 0902   BUN 25 11/25/2011 1119   CREATININE 1.1 12/12/2013 0902   GLUCOSE 147* 12/12/2013 0902   GLUCOSE 465* 11/25/2011 1119   CALCIUM 8.9 12/12/2013 0902   Lipids: Doing well, stable. Tolerating meds fine with no SE. Panel reviewed with patient.  Lipids:    Component Value Date/Time   CHOL 159 12/12/2013 0902   TRIG 635.0* 12/12/2013 0902   HDL 29.30* 12/12/2013 0902   LDLDIRECT 64.1 12/12/2013 0902   VLDL 127.0* 12/12/2013 0902   CHOLHDL 5 12/12/2013 0902    Lab Results  Component Value Date   ALT 20 12/12/2013   AST 16 12/12/2013   ALKPHOS 69 12/12/2013   BILITOT 0.6 12/12/2013    CKD, 3: Comprehensive Metabolic Panel: Basic Metabolic Panel:    Component Value Date/Time   NA 136 12/12/2013 0902   NA 130* 11/25/2011 1119   K 3.9 12/12/2013 0902   CL 102 12/12/2013 0902   CO2 28 12/12/2013  0902   BUN 16 12/12/2013 0902   BUN 25 11/25/2011 1119   CREATININE 1.1 12/12/2013 0902   GLUCOSE 147* 12/12/2013 0902   GLUCOSE 465* 11/25/2011 1119   CALCIUM 8.9 12/12/2013 0902    CR has improved over time.     Patient Active Problem List   Diagnosis Date Noted  . Type I (juvenile type) diabetes mellitus with neurological manifestations, uncontrolled 08/05/2011    Priority: High  . Chronic kidney disease, stage III (moderate) 12/19/2012    Priority: Medium  . OSA (obstructive sleep apnea) 03/09/2011     Priority: Medium  . Diabetic neuropathy 12/29/2010    Priority: Medium  . HYPERLIPIDEMIA 06/13/2008    Priority: Medium  . Major depressive disorder, recurrent episode, in partial remission 06/13/2008    Priority: Medium  . HYPERTENSION 06/13/2008    Priority: Medium  . Back pain 08/22/2013  . Hypogonadism male 06/14/2012  . Hemorrhoids with bleeding (L lateral pile especially) 09/27/2011  . Obesity (BMI 30-39.9) 09/27/2011  . History of smoking 09/16/2010  . Family history of MI (myocardial infarction) 09/16/2010  . HIP REPLACEMENT, RIGHT, HX OF 02/05/2010  . GOUT 06/13/2008  . ALLERGIC RHINITIS 06/13/2008  . GERD 06/13/2008  . OSTEOARTHRITIS 06/13/2008   Past Medical History  Diagnosis Date  . Depression   . Diabetes mellitus   . Hyperlipidemia   . Hypertension   . Osteoarthritis   . Allergic rhinitis due to pollen   . GERD (gastroesophageal reflux disease)   . Gout   . Diverticulosis   . Personal history of colonic polyps     1996  . Low testosterone   . Iron deficiency   . CKD (chronic kidney disease)   . Hypogonadism male 06/14/2012  . Chronic kidney disease, stage III (moderate) 12/19/2012   Past Surgical History  Procedure Laterality Date  . Cholecystectomy  1980  . Total hip arthroplasty  2009    Dr. Gladstone Lighter  . Total hip arthroplasty    . Cardiac catheterization  11/2011    [facility]   History   Social History  . Marital Status: Married    Spouse Name: N/A    Number of Children: N/A  . Years of Education: N/A   Occupational History  . Dispatcher   . retired Administrator    Social History Main Topics  . Smoking status: Former Smoker -- 3.00 packs/day for 35 years    Types: Cigarettes    Quit date: 04/12/2000  . Smokeless tobacco: Never Used  . Alcohol Use: No  . Drug Use: No  . Sexual Activity: Not on file   Other Topics Concern  . Not on file   Social History Narrative   No regular exercise   Caffeine use: yes, dt coke   Family History    Problem Relation Age of Onset  . Diabetes Mother   . Diabetes Father   . Lung cancer Sister     lung  . Heart disease Maternal Grandfather   . COPD Sister   . Heart attack Sister   . Heart disease Sister    Allergies  Allergen Reactions  . Tetracycline Itching and Rash    Medication list has been reviewed and updated.   General: Denies fever, chills, sweats. No significant weight loss. Eyes: Denies blurring,significant itching ENT: Denies earache, sore throat, and hoarseness. Cardiovascular: Denies chest pains, palpitations, dyspnea on exertion Respiratory: Denies cough, dyspnea at rest,wheeezing Breast: no concerns about lumps GI: Denies nausea, vomiting, diarrhea, constipation,  change in bowel habits, abdominal pain, melena, hematochezia GU: Denies penile discharge, ED, urinary flow / outflow problems. No STD concerns. Musculoskeletal: Denies back pain, joint pain Derm: Denies rash, itching Neuro: Denies  paresthesias, frequent falls, frequent headaches Psych: Denies depression, anxiety Endocrine: Denies cold intolerance, heat intolerance, polydipsia Heme: Denies enlarged lymph nodes Allergy: No hayfever  Objective:   BP 128/70  Pulse 78  Temp(Src) 97.9 F (36.6 C) (Oral)  Ht 5' 11.5" (1.816 m)  Wt 238 lb 8 oz (108.183 kg)  BMI 32.80 kg/m2  SpO2 96%  The patient completed a fall screen and PHQ-2 and PHQ-9 if necessary, which is documented in the EHR. The CMA/LPN/RN who assisted the patient verbally completed with them and documented results in [place].   Hearing Screening   _0  _1  _2  _3  _4  _5  _6   Right ear:   40 40 40 0   Left ear:   40 40 40 0     Visual Acuity Screening   Right eye Left eye Both eyes  Without correction:     With correction: _7    GEN: well developed, well nourished, no acute distress Eyes: conjunctiva and lids normal, PERRLA, EOMI ENT: TM clear, nares clear, oral exam WNL Neck:  supple, no lymphadenopathy, no thyromegaly, no JVD Pulm: clear to auscultation and percussion, respiratory effort normal CV: regular rate and rhythm, S1-S2, no murmur, rub or gallop, no bruits, peripheral pulses normal and symmetric, no cyanosis, clubbing, edema or varicosities GI: soft, non-tender; no hepatosplenomegaly, masses; active bowel sounds all quadrants GU: no hernia, testicular mass, penile discharge Lymph: no cervical, axillary or inguinal adenopathy MSK: gait normal, muscle tone and strength WNL, no joint swelling, effusions, discoloration, crepitus  SKIN: clear, good turgor, color WNL, no rashes, lesions, or ulcerations Neuro: normal mental status, normal strength. LOWER EXTR NEUROPATHY, MILD Psych: alert; oriented to person, place and time, normally interactive and not anxious or depressed in appearance.  All labs reviewed with patient.  Lipids:    Component Value Date/Time   CHOL 159 12/12/2013 0902   TRIG 635.0* 12/12/2013 0902   HDL 29.30* 12/12/2013 0902   LDLDIRECT 64.1 12/12/2013 0902   VLDL 127.0* 12/12/2013 0902   CHOLHDL 5 12/12/2013 0902   CBC: CBC Latest Ref Rng 12/12/2013 11/08/2013 12/12/2012  WBC 4.0 - 10.5 K/uL 4.6 6.3 7.5  Hemoglobin 13.0 - 17.0 g/dL 12.3(L) 13.4 12.8(L)  Hematocrit 39.0 - 52.0 % 35.6(L) 37.6(L) 37.2(L)  Platelets 150.0 - 400.0 K/uL 156.0 161 588.3    Basic Metabolic Panel:    Component Value Date/Time   NA 136 12/12/2013 0902   NA 130* 11/25/2011 1119   K 3.9 12/12/2013 0902   CL 102 12/12/2013 0902   CO2 28 12/12/2013 0902   BUN 16 12/12/2013 0902   BUN 25 11/25/2011 1119   CREATININE 1.1 12/12/2013 0902   GLUCOSE 147* 12/12/2013 0902   GLUCOSE 465* 11/25/2011 1119   CALCIUM 8.9 12/12/2013 0902   Hepatic Function Latest Ref Rng 12/12/2013 12/12/2012 04/27/2011  Total Protein 6.0 - 8.3 g/dL 6.4 6.6 6.6  Albumin 3.5 - 5.2 g/dL 3.6 3.7 3.8  AST 0 - 37 U/L _8 ALT 0 - 53 U/L _9 Alk Phosphatase 39 - 117 U/L 69 75 85  Total Bilirubin 0.2 - 1.2 mg/dL  0.6 0.5 0.7  Bilirubin, Direct 0.0 - 0.3 mg/dL 0.1 0.1 0.1    Lab Results  Component Value Date  TSH 0.55 08/26/2011   Lab Results  Component Value Date   PSA 0.38 12/12/2013   PSA 0.87 12/12/2012   PSA 0.61 06/09/2012    Assessment and Plan:   Routine general medical examination at a health care facility  Need for prophylactic vaccination and inoculation against influenza - Plan: Flu Vaccine QUAD 36+ mos PF IM (Fluarix Quad PF)  Need for vaccination with 13-polyvalent pneumococcal conjugate vaccine - Plan: Pneumococcal conjugate vaccine 13-valent  Type I (juvenile type) diabetes mellitus with neurological manifestations, uncontrolled: overall doing terrible - he also saw Dr. Renne Crigler yesterday. See her notes.   Chronic kidney disease, stage III (moderate): Imptoved in the last year  Diabetic polyneuropathy associated with diabetes mellitus due to underlying condition: tolerable at this point.   HYPERLIPIDEMIA: Unstable, on statin. Work on your diet: Low glycemic index diet.  Anything fried is bad, cream, beef and pork fat are bad.  Exercise is incredibly important to heart health. Try to exercise for at least 30 minutes 5 - 6 times a week   HYPERTENSION: stable  OSA (obstructive sleep apnea)  Obesity (BMI 30-39.9)  History of smoking  OSTEOARTHRITIS: doing ok here, much better post hip replacement.   Health Maintenance Exam: The patient's preventative maintenance and recommended screening tests for an annual wellness exam were reviewed in full today. Brought up to date unless services declined.  Counselled on the importance of diet, exercise, and its role in overall health and mortality. The patient's FH and SH was reviewed, including their home life, tobacco status, and drug and alcohol status.  I have personally reviewed the Medicare Annual Wellness questionnaire and have noted 1. The patient's medical and social history 2. Their use of alcohol, tobacco or illicit  drugs 3. Their current medications and supplements 4. The patient's functional ability including ADL's, fall risks, home safety risks and hearing or visual             impairment. 5. Diet and physical activities 6. Evidence for depression or mood disorders  The patients weight, height, BMI and visual acuity have been recorded in the chart I have made referrals, counseling and provided education to the patient based review of the above and I have provided the pt with a written personalized care plan for preventive services.  I have provided the patient with a copy of your personalized plan for preventive services. Instructed to take the time to review along with their updated medication list.  Follow-up: No Follow-up on file. Or follow-up in 1 year for complete physical examination  New Prescriptions   No medications on file   Orders Placed This Encounter  Procedures  . Flu Vaccine QUAD 36+ mos PF IM (Fluarix Quad PF)  . Pneumococcal conjugate vaccine 13-valent    Signed,  Haiden Clucas T. Jari Dipasquale, MD  Patient Instructions  zostavax (SHINGLES VACCINE) -- go to one of the pharmacies that has a sign that says "Shingles Vaccines" here. You can go there and they will check with your insurance and they can give it to you there.    Patient's Medications  New Prescriptions   No medications on file  Previous Medications   ACETAMINOPHEN (TYLENOL) 500 MG TABLET    Take 1,000 mg by mouth every 6 (six) hours as needed for moderate pain.   AMPICILLIN (PRINCIPEN) 500 MG CAPSULE    Take 1 capsule by mouth daily.   ASPIRIN 81 MG CHEWABLE TABLET    Chew 81 mg by mouth at bedtime.  ATENOLOL (TENORMIN) 100 MG TABLET    Take 1 tablet (100 mg total) by mouth daily.   B COMPLEX VITAMINS TABLET    Take 1 tablet by mouth daily.     CHOLECALCIFEROL (VITAMIN D) 1000 UNITS TABLET    Take 1,000 Units by mouth daily.     DOXAZOSIN (CARDURA) 8 MG TABLET    Take 0.5 tablets (4 mg total) by mouth at bedtime.    FAMOTIDINE (PEPCID) 10 MG TABLET    Take 10 mg by mouth daily.     FLUTICASONE (FLONASE) 50 MCG/ACT NASAL SPRAY    Place 1 spray into both nostrils daily as needed for allergies or rhinitis.   INSULIN NPH HUMAN (HUMULIN N,NOVOLIN N) 100 UNIT/ML INJECTION    Inject 0.35 mLs (35 Units total) into the skin 3 (three) times daily.   INSULIN REGULAR (NOVOLIN R,HUMULIN R) 100 UNITS/ML INJECTION    Inject 0.35 mLs (35 Units total) into the skin 3 (three) times daily before meals.   LISINOPRIL-HYDROCHLOROTHIAZIDE (PRINZIDE,ZESTORETIC) 20-12.5 MG PER TABLET    Take 1 tablet by mouth daily.   NYSTATIN CREAM (MYCOSTATIN)    Apply topically 2 (two) times daily as needed.   SIMVASTATIN (ZOCOR) 40 MG TABLET    Take 40 mg by mouth at bedtime.   VENLAFAXINE XR (EFFEXOR-XR) 75 MG 24 HR CAPSULE    Take 75 mg by mouth 3 (three) times daily.  Modified Medications   No medications on file  Discontinued Medications   No medications on file

## 2013-12-20 ENCOUNTER — Encounter: Payer: Self-pay | Admitting: Internal Medicine

## 2013-12-20 ENCOUNTER — Ambulatory Visit (INDEPENDENT_AMBULATORY_CARE_PROVIDER_SITE_OTHER): Payer: Medicare Other | Admitting: Internal Medicine

## 2013-12-20 VITALS — BP 124/84 | HR 95 | Temp 97.9°F | Resp 12 | Wt 236.0 lb

## 2013-12-20 DIAGNOSIS — E1049 Type 1 diabetes mellitus with other diabetic neurological complication: Secondary | ICD-10-CM | POA: Diagnosis not present

## 2013-12-20 DIAGNOSIS — E1065 Type 1 diabetes mellitus with hyperglycemia: Secondary | ICD-10-CM | POA: Diagnosis not present

## 2013-12-20 NOTE — Patient Instructions (Signed)
Please change the insulin doses as follows: Type of insulin Breakfast Lunch Dinner  Humulin R 30 >> 33 35 >> 30 30 >> 33  Humulin N 40  35  Please inject 30 min before every main meal. Do not skip doses. Check sugars at least 3x a day before meals. Please return in 1 months with your sugar log.

## 2013-12-20 NOTE — Progress Notes (Signed)
Patient ID: [name], male   DOB: 26-May-1945, 68 y.o.   [name]: [id number]  HPI: [name] is a 68 y.o.-year-old male, returning for f/u for DM2, dx 1997, insulin-dependent since 2010, uncontrolled, with complications (CKD stage 3, PN). Last visit was 4 mo ago.  On 11/08/2013 >> went to the ED with CP >> sugars 514! He r/o for MI.  Last hemoglobin A1c was: Lab Results  Component Value Date   HGBA1C 12.0* 12/12/2013   HGBA1C 12.8* 06/06/2013   HGBA1C 12.9* 12/12/2012   He is on: Type of insulin Breakfast Lunch Dinner  Humulin R 30 35 30  Humulin N 40  35  He does not have problems mixing the 2 insulins. They are cheap: $24 per bottle from [facility], while they were $200 at [facility]!  He forgets to take the midday insulin at least 3-4x a week. He forgets the am and dinnertime mix, too, 3-4x a week.  Pt checks his sugars once a day in am (forgot log) and they are: - am: 150-170 >> 250 >> 130-140 (when takes the insulin correctly) >> 300-400s - 2h after b'fast: n/c  - before lunch: n/c - 2h after lunch: n/c >> 300s >> n/c - before dinner: n/c - 2h after dinner: n/c - bedtime: n/c  - nighttime: 60s, wakes him up >> not recently Had one low last week >> felt it, did not check; he has hypoglycemia awareness at 100.  Highest sugar was Hi.  Pt's meals are (he reduced bread, uses whole wheat now): - Breakfast: 2 slices buttered toast - Lunch: ham sandwich + chips - Dinner: meat + veggies + starch - Snacks: 4-5 x a day  - has CKD, last BUN/creatinine:  Lab Results  Component Value Date   BUN 16 12/12/2013   CREATININE 1.1 12/12/2013  Last ACR 4 on 12/12/2012. On Lisinopril 20.  - last set of lipids: Lab Results  Component Value Date   CHOL 159 12/12/2013   HDL 29.30* 12/12/2013   LDLCALC 43 04/24/2009   LDLDIRECT 64.1 12/12/2013   TRIG 635.0* 12/12/2013   CHOLHDL 5 12/12/2013  On Zocor 40. - last eye exam was in 2014. No DR.  -+ numbness and tingling in his feet, especially at  night.  I reviewed pt's medications, allergies, PMH, social hx, family hx and no changes required, except as mentioned above.   ROS: Constitutional: no weight gain/loss, no fatigue, no subjective hyperthermia/hypothermia, + excessive urination, + nocturia x1-2 Eyes: no blurry vision, no xerophthalmia ENT: no sore throat, no nodules palpated in throat, no dysphagia/odynophagia, + tinnitus Cardiovascular: no CP/SOB/no palpitations/+ leg swelling Respiratory: no cough/SOB Gastrointestinal: no N/V/D/C/heartburn Musculoskeletal: no muscle/no joint aches Skin: no rashes Neurological: no tremors/numbness/tingling/dizziness + diff with erections, + low libido  PE: BP 124/84  Pulse 95  Temp(Src) 97.9 F (36.6 C) (Oral)  Resp 12  Wt 236 lb (107.049 kg)  SpO2 99% Wt Readings from Last 3 Encounters:  12/20/13 236 lb (107.049 kg)  12/19/13 238 lb 8 oz (108.183 kg)  11/13/13 235 lb 8 oz (106.822 kg)   Constitutional: overweight, in NAD Eyes: PERRLA, EOMI, no exophthalmos ENT: moist mucous membranes, no thyromegaly, no cervical lymphadenopathy, + tinnitus Cardiovascular: RRR, No MRG Respiratory: CTA B Gastrointestinal: abdomen soft, NT, ND, BS+ Musculoskeletal: no deformities, strength intact in all 4 Skin: moist, warm, no rashes  ASSESSMENT: 1. DM2, insulin-dependent, uncontrolled, with complications - CKD stage 3 - PN  PLAN:  1. Patient with long-standing, uncontrolled  diabetes, now on a basal-bolus NPH and regular insulin regimen. Still not checking sugars consistently and forgetting many insulin doses... We discussed about the fact that he needs to take his insulin as advised, otherwise we cannot have any hope to improve his control.  - I suggested to:  Please change the insulin doses as follows: Type of insulin Breakfast Lunch Dinner  Humulin R 30 >> 33 35 >> 30 30 >> 33  Humulin N 40  35  Please inject 30 min before every main meal. Do not skip doses. Check sugars at least  3x a day before meals. Please return in 1 months with your sugar log.  - continue checking sugars at different times of the day - check 3 times a day, rotating checks - advised him to set alarms on his phone to remind him to take his insulin - given new sugar logs  - advised to schedule a new eye exam

## 2014-01-25 ENCOUNTER — Other Ambulatory Visit: Payer: Self-pay

## 2014-01-29 ENCOUNTER — Ambulatory Visit: Payer: Medicare Other | Admitting: Internal Medicine

## 2014-02-19 ENCOUNTER — Other Ambulatory Visit: Payer: Self-pay | Admitting: Family Medicine

## 2014-03-14 ENCOUNTER — Ambulatory Visit (INDEPENDENT_AMBULATORY_CARE_PROVIDER_SITE_OTHER): Payer: Medicare Other | Admitting: Internal Medicine

## 2014-03-14 ENCOUNTER — Encounter: Payer: Self-pay | Admitting: Internal Medicine

## 2014-03-14 VITALS — BP 130/64 | HR 79 | Temp 98.1°F | Wt 243.0 lb

## 2014-03-14 DIAGNOSIS — J209 Acute bronchitis, unspecified: Secondary | ICD-10-CM

## 2014-03-14 MED ORDER — AMOXICILLIN-POT CLAVULANATE 875-125 MG PO TABS: 1.0000 | ORAL_TABLET | Freq: Two times a day (BID) | ORAL | Status: DC

## 2014-03-14 NOTE — Patient Instructions (Signed)

## 2014-03-14 NOTE — Progress Notes (Signed)
Pre visit review using our clinic review tool, if applicable. No additional management support is needed unless otherwise documented below in the visit note. 

## 2014-03-14 NOTE — Progress Notes (Signed)
HPI  Pt presents to the clinic today with c/o cough. He reports this started about 10 days ago. The cough is productive of thick green/yellow mucous. He has had an associated sore throat but denies fever, chills or body aches. He has tried OTC sinus medication without relief. He does have a history of seasonal allergies. He has had sick contacts.  Review of Systems      Past Medical History  Diagnosis Date  . Depression   . Diabetes mellitus   . Hyperlipidemia   . Hypertension   . Osteoarthritis   . Allergic rhinitis due to pollen   . GERD (gastroesophageal reflux disease)   . Gout   . Diverticulosis   . Personal history of colonic polyps     1996  . Low testosterone   . Iron deficiency   . CKD (chronic kidney disease)   . Hypogonadism male 06/14/2012  . Chronic kidney disease, stage III (moderate) 12/19/2012    Family History  Problem Relation Age of Onset  . Diabetes Mother   . Diabetes Father   . Lung cancer Sister     lung  . Heart disease Maternal Grandfather   . COPD Sister   . Heart attack Sister   . Heart disease Sister     History   Social History  . Marital Status: Married    Spouse Name: N/A    Number of Children: N/A  . Years of Education: N/A   Occupational History  . Dispatcher   . retired Administrator    Social History Main Topics  . Smoking status: Former Smoker -- 3.00 packs/day for 35 years    Types: Cigarettes    Quit date: 04/12/2000  . Smokeless tobacco: Never Used  . Alcohol Use: No  . Drug Use: No  . Sexual Activity: Not on file   Other Topics Concern  . Not on file   Social History Narrative   No regular exercise   Caffeine use: yes, dt coke    Allergies  Allergen Reactions  . Tetracycline Itching and Rash     Constitutional: Denies headache, fatigue and fever.   HEENT:  Positive sore throat. Denies eye redness, eye pain, pressure behind the eyes, facial pain, nasal congestion, ear pain, ringing in the ears, wax buildup,  runny nose or bloody nose. Respiratory: Positive cough. Denies difficulty breathing or shortness of breath.  Cardiovascular: Denies chest pain, chest tightness, palpitations or swelling in the hands or feet.   No other specific complaints in a complete review of systems (except as listed in HPI above).  Objective:   BP 130/64 mmHg  Pulse 79  Temp(Src) 98.1 F (36.7 C) (Oral)  Wt 243 lb (110.224 kg)  SpO2 97% Wt Readings from Last 3 Encounters:  03/14/14 243 lb (110.224 kg)  12/20/13 236 lb (107.049 kg)  12/19/13 238 lb 8 oz (108.183 kg)     General: Appears his stated age, obese  in NAD. HEENT: Head: normal shape and size; Ears: Tm's gray and intact, normal light reflex; Nose: mucosa pink and moist, septum midline; Throat/Mouth: Teeth present, mucosa erythematous and moist, no exudate noted, no lesions or ulcerations noted. No cervical adenopathy. Cardiovascular: Normal rate and rhythm. S1,S2 noted.  No murmur, rubs or gallops noted.  Pulmonary/Chest: Normal effort and coarse rhonchi in LUL and RUL with expiratory wheeze. No respiratory distress.      Assessment & Plan:   Acute Bronchitis:  Get some rest and drink plenty of water  Do salt water gargles for the sore throat eRx for Augmentin BID x 10 days (he report zpack does not work for him) Delsym OTC for cough  RTC as needed or if symptoms persist.

## 2014-03-18 ENCOUNTER — Other Ambulatory Visit: Payer: Self-pay | Admitting: Family Medicine

## 2014-04-10 ENCOUNTER — Ambulatory Visit (INDEPENDENT_AMBULATORY_CARE_PROVIDER_SITE_OTHER): Payer: Medicare Other | Admitting: Family Medicine

## 2014-04-10 ENCOUNTER — Encounter: Payer: Self-pay | Admitting: Family Medicine

## 2014-04-10 VITALS — BP 114/62 | HR 81 | Temp 98.0°F | Wt 242.0 lb

## 2014-04-10 DIAGNOSIS — N3943 Post-void dribbling: Secondary | ICD-10-CM | POA: Diagnosis not present

## 2014-04-10 DIAGNOSIS — T887XXA Unspecified adverse effect of drug or medicament, initial encounter: Secondary | ICD-10-CM

## 2014-04-10 DIAGNOSIS — T50905A Adverse effect of unspecified drugs, medicaments and biological substances, initial encounter: Secondary | ICD-10-CM

## 2014-04-10 LAB — POCT URINALYSIS DIPSTICK
Bilirubin, UA: NEGATIVE
Blood, UA: NEGATIVE
Ketones, UA: NEGATIVE
LEUKOCYTES UA: NEGATIVE
NITRITE UA: NEGATIVE
Spec Grav, UA: >=1.03
Urobilinogen, UA: NEGATIVE
pH, UA: 5.5

## 2014-04-10 NOTE — Progress Notes (Signed)
Dr. Frederico Hamman T. Anden Bartolo, MD, [facility] [place], [address] Phone: [phone] Fax: [phone]  04/10/2014  Patient: [name], [name]: [id number], DOB: 27-May-1945, 68 y.o.  Primary Physician:  [name], MD  Chief Complaint: urinary pressure  Subjective:   [name] is a 68 y.o. very pleasant male patient who presents with the following:  Trouble having pressure when he is urinating. Not as much coming out for 2-3 weeks. No discharge.   Mucinex sinus (phenylephrine) the same time  Past Medical History, Surgical History, Social History, Family History, Problem List, Medications, and Allergies have been reviewed and updated if relevant.   GEN: No acute illnesses, no fevers, chills. GI: No n/v/d, eating normally Pulm: No SOB Interactive and getting along well at home.  Otherwise, ROS is as per the HPI.  Objective:   BP 114/62 mmHg  Pulse 81  Temp(Src) 98 F (36.7 C) (Oral)  Wt 242 lb (109.77 kg)  SpO2 96%  GEN: WDWN, NAD, Non-toxic, A & O x 3 HEENT: Atraumatic, Normocephalic. Neck supple. No masses, No LAD. Ears and Nose: No external deformity. CV: RRR, No M/G/R. No JVD. No thrill. No extra heart sounds. PULM: CTA B, no wheezes, crackles, rhonchi. No retractions. No resp. distress. No accessory muscle use. EXTR: No c/c/e NEURO Normal gait.  PSYCH: Normally interactive. Conversant. Not depressed or anxious appearing.  Calm demeanor.   Laboratory and Imaging Data: Results for orders placed or performed in visit on 04/10/14  Urinalysis Dipstick  Result Value Ref Range   Color, UA dark yellow    Clarity, UA clear    Glucose, UA 3+    Bilirubin, UA neg    Ketones, UA neg    Spec Grav, UA >=1.030    Blood, UA neg    pH, UA 5.5    Protein, UA 2+    Urobilinogen, UA negative    Nitrite, UA neg    Leukocytes, UA Negative      Assessment and Plan:   Urinary dribbling - Plan:  Urinalysis Dipstick, Urine culture  Medication side effect, initial encounter  I think that this almost certainly is secondary to the phenylephrine in his current cold medication.  Recommended that he stop this and stop all forms of decongestants.  If he is not better in 10-14 days, and I could give him some Flomax.  Follow-up: No Follow-up on file.  New Prescriptions   No medications on file   Orders Placed This Encounter  Procedures  . Urine culture  . Urinalysis Dipstick    Signed,  Frederico Hamman T. Compton Brigance, MD   Patient's Medications  New Prescriptions   No medications on file  Previous Medications   ACETAMINOPHEN (TYLENOL) 500 MG TABLET    Take 1,000 mg by mouth every 6 (six) hours as needed for moderate pain.   AMPICILLIN (PRINCIPEN) 500 MG CAPSULE    Take 1 capsule by mouth daily.   ASPIRIN 81 MG CHEWABLE TABLET    Chew 81 mg by mouth at bedtime.    ATENOLOL (TENORMIN) 100 MG TABLET    Take 1 tablet (100 mg total) by mouth daily.   B COMPLEX VITAMINS TABLET    Take 1 tablet by mouth daily.     CHOLECALCIFEROL (VITAMIN D) 1000 UNITS TABLET    Take 1,000 Units by mouth daily.     DOXAZOSIN (CARDURA) 8 MG TABLET    Take 0.5 tablets (4 mg  total) by mouth at bedtime.   FAMOTIDINE (PEPCID) 10 MG TABLET    Take 10 mg by mouth daily.     FLUTICASONE (FLONASE) 50 MCG/ACT NASAL SPRAY    INHALE 2 SPRAYS INTO EACH NOSTRIL EVERY DAY. AS NEEDED   INSULIN NPH HUMAN (HUMULIN N,NOVOLIN N) 100 UNIT/ML INJECTION    Inject 0.35 mLs (35 Units total) into the skin 3 (three) times daily.   INSULIN REGULAR (NOVOLIN R,HUMULIN R) 100 UNITS/ML INJECTION    Inject 0.35 mLs (35 Units total) into the skin 3 (three) times daily before meals.   LISINOPRIL-HYDROCHLOROTHIAZIDE (PRINZIDE,ZESTORETIC) 20-12.5 MG PER TABLET    Take 1 tablet by mouth daily.   NYSTATIN CREAM (MYCOSTATIN)    Apply topically 2 (two) times daily as needed.   SIMVASTATIN (ZOCOR) 40 MG TABLET    TAKE ONE TABLET BY MOUTH EVERY NIGHT AT  BEDTIME   VENLAFAXINE XR (EFFEXOR-XR) 75 MG 24 HR CAPSULE    TAKE THREE (3) CAPSULES BY MOUTH DAILY  Modified Medications   No medications on file  Discontinued Medications   AMOXICILLIN-CLAVULANATE (AUGMENTIN) 875-125 MG PER TABLET    Take 1 tablet by mouth 2 (two) times daily.

## 2014-04-10 NOTE — Progress Notes (Signed)
Pre visit review using our clinic review tool, if applicable. No additional management support is needed unless otherwise documented below in the visit note. 

## 2014-04-11 LAB — URINE CULTURE
COLONY COUNT: NO GROWTH
Organism ID, Bacteria: NO GROWTH

## 2014-07-07 ENCOUNTER — Other Ambulatory Visit: Payer: Self-pay | Admitting: Family Medicine

## 2014-07-12 ENCOUNTER — Encounter: Payer: Self-pay | Admitting: Pulmonary Disease

## 2014-07-12 ENCOUNTER — Ambulatory Visit (INDEPENDENT_AMBULATORY_CARE_PROVIDER_SITE_OTHER): Payer: Medicare Other | Admitting: Pulmonary Disease

## 2014-07-12 VITALS — BP 134/64 | HR 110 | Temp 97.2°F | Ht 72.0 in | Wt 244.4 lb

## 2014-07-12 DIAGNOSIS — G4733 Obstructive sleep apnea (adult) (pediatric): Secondary | ICD-10-CM | POA: Diagnosis not present

## 2014-07-12 NOTE — Assessment & Plan Note (Signed)
The patient continues to do very well with his C Pap device. He feels that he sleeps well when wearing the device, and has adequate daytime alertness. I've asked him to keep up with his mask changes and supplies, and to follow-up with [place] again in a year.

## 2014-07-12 NOTE — Progress Notes (Signed)
   Subjective:    Patient ID: [name], male    DOB: 02-08-1946, 69 y.o.   [name]: [id number]  HPI The patient comes in today for follow-up of his obstructive sleep apnea. He is wearing CPAP compliantly by his history, although he did have a period of time where he had multiple deaths in the family where he did not wear his device. He is having no issues with his mask fit or pressure, and sleeps well with his device with adequate daytime alertness. He has had some issues getting replacement supplies with his current home care company.   Review of Systems  Constitutional: Negative for fever and unexpected weight change.  HENT: Negative for congestion, dental problem, ear pain, nosebleeds, postnasal drip, rhinorrhea, sinus pressure, sneezing, sore throat and trouble swallowing.   Eyes: Negative for redness and itching.  Respiratory: Negative for cough, chest tightness, shortness of breath and wheezing.   Cardiovascular: Negative for palpitations and leg swelling.  Gastrointestinal: Negative for nausea and vomiting.  Genitourinary: Negative for dysuria.  Musculoskeletal: Negative for joint swelling.  Skin: Negative for rash.  Neurological: Negative for headaches.  Hematological: Does not bruise/bleed easily.  Psychiatric/Behavioral: Negative for dysphoric mood. The patient is not nervous/anxious.        Objective:   Physical Exam Overweight male in no acute distress Nose without purulence or discharge noted No skin breakdown or pressure necrosis from the C Pap mask Neck without lymphadenopathy or thyromegaly Lower extremities without edema, no cyanosis Alert and oriented, moves all 4 extremities.       Assessment & Plan:

## 2014-07-12 NOTE — Patient Instructions (Signed)
Continue on cpap, and keep up with supplies and mask cushion changes.  Let us know if you are having a hard time with your home care company Work on weight loss followup with me again in one year.

## 2014-07-30 NOTE — Discharge Summary (Signed)
PATIENT NAME:  [name], [name] MR#:  [id number] DATE OF BIRTH:  [birth year]  DATE OF ADMISSION:  11/15/2011 DATE OF DISCHARGE:  11/16/2011  DISCHARGE DIAGNOSES:  1. Chest pain, possible unstable angina.  2. Chronic kidney disease.   HISTORY OF PRESENT ILLNESS/HOSPITAL COURSE: This is a 69 year old gentleman with past medical history of type 2 diabetes requiring insulin, hypertension, hyperlipidemia, and family history of premature coronary artery disease. He presented for an outpatient evaluation due to new symptoms of substernal chest tightness and dyspnea. This happened both at rest and with activity. Due to these symptoms and multiple risk factors for coronary artery disease, a cardiac catheterization was recommended. The patient is known to have chronic kidney disease with a baseline creatinine ranging from 1.8 to 2.0. Due to that, I recommended hospital admission the evening before for intravenous hydration which was performed with 0.45 saline at 100 mL/hour. His creatinine the next day was 1.2. Cardiac catheterization was performed which showed mild nonobstructive coronary artery disease. Only 35 mL of contrast was given during the cardiac catheterization. He was hydrated four hours after cardiac catheterization. No immediate complications were noted. The patient will be discharged home on the same outpatient medications and was instructed to follow up in one week.  ____________________________ Mertie Clause. Fletcher Anon, MD maa:slb D: 11/16/2011 17:21:41 ET T: 11/17/2011 13:17:37 ET JOB#: [id number]  cc: Larae Caison A. Fletcher Anon, MD, <Dictator> Wellington Hampshire MD ELECTRONICALLY SIGNED 11/23/2011 12:11

## 2014-09-10 ENCOUNTER — Ambulatory Visit (INDEPENDENT_AMBULATORY_CARE_PROVIDER_SITE_OTHER): Payer: Medicare Other | Admitting: Family Medicine

## 2014-09-10 ENCOUNTER — Encounter: Payer: Self-pay | Admitting: Family Medicine

## 2014-09-10 ENCOUNTER — Ambulatory Visit (INDEPENDENT_AMBULATORY_CARE_PROVIDER_SITE_OTHER)
Admission: RE | Admit: 2014-09-10 | Discharge: 2014-09-10 | Disposition: A | Discharge status: Home / Self Care | Payer: Medicare Other | Source: Ambulatory Visit | Attending: Family Medicine | Admitting: Family Medicine

## 2014-09-10 VITALS — BP 150/88 | HR 116 | Temp 98.2°F | Wt 243.0 lb

## 2014-09-10 DIAGNOSIS — R000 Tachycardia, unspecified: Secondary | ICD-10-CM | POA: Diagnosis not present

## 2014-09-10 DIAGNOSIS — E1049 Type 1 diabetes mellitus with other diabetic neurological complication: Secondary | ICD-10-CM

## 2014-09-10 DIAGNOSIS — R109 Unspecified abdominal pain: Secondary | ICD-10-CM

## 2014-09-10 DIAGNOSIS — E1065 Type 1 diabetes mellitus with hyperglycemia: Secondary | ICD-10-CM

## 2014-09-10 DIAGNOSIS — R10A1 Flank pain, right side: Secondary | ICD-10-CM | POA: Insufficient documentation

## 2014-09-10 DIAGNOSIS — E1041 Type 1 diabetes mellitus with diabetic mononeuropathy: Secondary | ICD-10-CM | POA: Diagnosis not present

## 2014-09-10 DIAGNOSIS — IMO0002 Reserved for concepts with insufficient information to code with codable children: Secondary | ICD-10-CM

## 2014-09-10 LAB — POCT URINALYSIS DIPSTICK
Blood, UA: NEGATIVE
Glucose, UA: NEGATIVE
Ketones, UA: NEGATIVE
LEUKOCYTES UA: NEGATIVE
Nitrite, UA: NEGATIVE
Spec Grav, UA: >=1.03
UROBILINOGEN UA: 0.2
pH, UA: 5

## 2014-09-10 MED ORDER — KETOROLAC TROMETHAMINE 30 MG/ML IJ SOLN
30.0000 mg | Freq: Once | INTRAMUSCULAR | Status: AC
  Administered 2014-09-10: 30 mg via INTRAMUSCULAR

## 2014-09-10 MED ORDER — DOXAZOSIN MESYLATE 8 MG PO TABS: 8.0000 mg | ORAL_TABLET | Freq: Every day | ORAL | Status: DC

## 2014-09-10 MED ORDER — NAPROXEN 500 MG PO TABS: ORAL_TABLET | ORAL | Status: DC

## 2014-09-10 NOTE — Progress Notes (Signed)
BP 150/88 mmHg  Pulse 116  Temp(Src) 98.2 F (36.8 C) (Oral)  Wt 243 lb (110.224 kg)  SpO2 97%   CC: R back pain  Subjective:    Patient ID: [name], male    DOB: 11-Aug-1945, 69 y.o.   [name]: [id number]  HPI: [name] is a 69 y.o. male presenting on 09/10/2014 for Back Pain   Pt of Dr Copland's with history of diabetes, HTN, HLD, OA, GERD, CKD and hypogonadism presents today with R flank pain that started this morning. Worse this morning 8/10 sharp stabbing pain, difficulty getting out of chair this morning. Currently 4/10 throbbing pain  No fevers/chills, dysuria, urgency, abd pain, nausea/vomiting, no midline back pain. Chronic urinary frequency and nocturia x2.   Last kidney stone was 10 yrs ago. This feels similar.  Decreased pressure with voiding - chronic issue. On cardura '4mg'$  for this. Denies h/o BPH.  On ampicillin '500mg'$  daily for acne per dermatology.  Endorses occasional low sugars.  Relevant past medical, surgical, family and social history reviewed and updated as indicated. Interim medical history since our last visit reviewed. Allergies and medications reviewed and updated. Current Outpatient Prescriptions on File Prior to Visit  Medication Sig  . acetaminophen (TYLENOL) 500 MG tablet Take 1,000 mg by mouth every 6 (six) hours as needed for moderate pain.  [name] Kitchen ampicillin (PRINCIPEN) 500 MG capsule Take 1 capsule by mouth daily.  [name] Kitchen aspirin 81 MG chewable tablet Chew 81 mg by mouth at bedtime.   [name] Kitchen atenolol (TENORMIN) 100 MG tablet TAKE ONE TABLET BY MOUTH ONCE DAILY  . b complex vitamins tablet Take 1 tablet by mouth daily.    . cholecalciferol (VITAMIN D) 1000 UNITS tablet Take 1,000 Units by mouth daily.    . famotidine (PEPCID) 10 MG tablet Take 10 mg by mouth daily.    . fluticasone (FLONASE) 50 MCG/ACT nasal spray INHALE 2 SPRAYS INTO EACH NOSTRIL EVERY DAY. AS NEEDED  . insulin NPH Human (HUMULIN N,NOVOLIN N) 100 UNIT/ML injection Inject 0.35 mLs  (35 Units total) into the skin 3 (three) times daily.  . insulin regular (NOVOLIN R,HUMULIN R) 100 units/mL injection Inject 0.35 mLs (35 Units total) into the skin 3 (three) times daily before meals.  [name] Kitchen lisinopril-hydrochlorothiazide (PRINZIDE,ZESTORETIC) 20-12.5 MG per tablet TAKE ONE TABLET BY MOUTH ONCE DAILY  . nystatin cream (MYCOSTATIN) Apply topically 2 (two) times daily as needed.  . simvastatin (ZOCOR) 40 MG tablet TAKE ONE TABLET BY MOUTH EVERY NIGHT AT BEDTIME  . venlafaxine XR (EFFEXOR-XR) 75 MG 24 hr capsule TAKE THREE (3) CAPSULES BY MOUTH DAILY   No current facility-administered medications on file prior to visit.    Review of Systems Per HPI unless specifically indicated above     Objective:    BP 150/88 mmHg  Pulse 116  Temp(Src) 98.2 F (36.8 C) (Oral)  Wt 243 lb (110.224 kg)  SpO2 97%  Wt Readings from Last 3 Encounters:  09/10/14 243 lb (110.224 kg)  07/12/14 244 lb 6.4 oz (110.859 kg)  04/10/14 242 lb (109.77 kg)    Physical Exam  Constitutional: He appears well-developed and well-nourished. No distress.  HENT:  Mouth/Throat: Oropharynx is clear and moist. No oropharyngeal exudate.  Eyes: Conjunctivae and EOM are normal. Pupils are equal, round, and reactive to light. No scleral icterus.  Neck: Normal range of motion. Neck supple.  Cardiovascular: Regular rhythm, normal heart sounds and intact distal pulses.  Tachycardia present.   No murmur heard. Pulmonary/Chest:  Effort normal and breath sounds normal. No respiratory distress. He has no wheezes. He has no rales.  Abdominal: Soft. Bowel sounds are normal. He exhibits no distension and no mass. There is generalized tenderness (mild). There is CVA tenderness (mild R). There is no rebound and no guarding.  Musculoskeletal: He exhibits no edema.  Skin: Skin is warm and dry.  Nursing note and vitals reviewed.  Results for orders placed or performed in visit on 09/10/14  POCT Urinalysis Dipstick  Result  Value Ref Range   Color, UA Dark Yellow    Clarity, UA Hazy    Glucose, UA Negative    Bilirubin, UA 1+    Ketones, UA Negative    Spec Grav, UA >=1.030    Blood, UA Negative    pH, UA 5.0    Protein, UA 1+    Urobilinogen, UA 0.2    Nitrite, UA Negative    Leukocytes, UA Negative    Lab Results  Component Value Date   CREATININE 1.1 12/12/2013      Assessment & Plan:   Problem List Items Addressed This Visit    Right flank pain - Primary    sxs consistent with kidney stone but UA without hematuria - check KUB today - clear on my read. Discussed with patient. He is already on cardura but only '4mg'$  daily. Will increase to '8mg'$  daily, start straining urine, and push water. Treat pain with shot of toradol in office '30mg'$  IM and then naprosyn bid x 5 days. Check kidneys today prior to starting naprosyn course. RTC 2-3 wks f/u with pcp, discussed return sooner if not improving with this treatment regimen. Pt agrees with plan.      Relevant Medications   ketorolac (TORADOL) 30 MG/ML injection 30 mg (Completed)   Other Relevant Orders   POCT Urinalysis Dipstick (Completed)   DG Abd 1 View   Basic metabolic panel   CBC with Differential/Platelet   Tachycardia    Sounds regular today. Anticipate component of pain, but pt also endorses persistently elevated readings at home over last few weeks. Increase cardura to '8mg'$  daily for better bp control, monitor tachycardia with improved pain control.      Type 1 diabetes mellitus with neurological manifestations, uncontrolled   Relevant Orders   Hemoglobin A1c       Follow up plan: Return in about 3 weeks (around 10/01/2014), or if symptoms worsen or fail to improve, for follow up visit.

## 2014-09-10 NOTE — Progress Notes (Signed)
Pre visit review using our clinic review tool, if applicable. No additional management support is needed unless otherwise documented below in the visit note. 

## 2014-09-10 NOTE — Assessment & Plan Note (Addendum)
Sounds regular today. Anticipate component of pain, but pt also endorses persistently elevated readings at home over last few weeks. Increase cardura to '8mg'$  daily for better bp control, monitor tachycardia with improved pain control.

## 2014-09-10 NOTE — Patient Instructions (Addendum)
I'm still suspicious for kidney stone. Shot of toradol '30mg'$  today. Increase cardura to '8mg'$  daily. Add naprosyn '500mg'$  twice daily with food for 5 days starting tomorrow then as needed. Start straining urine. Return to [place] if fever, UTI symptoms like burning, or not improving with treatment after 2 days. Otherwise, follow up with Dr Lorelei Pont in 2-3 weeks.

## 2014-09-10 NOTE — Assessment & Plan Note (Addendum)
sxs consistent with kidney stone but UA without hematuria - check KUB today - clear on my read. Discussed with patient. He is already on cardura but only '4mg'$  daily. Will increase to '8mg'$  daily, start straining urine, and push water. Treat pain with shot of toradol in office '30mg'$  IM and then naprosyn bid x 5 days. Check kidneys today prior to starting naprosyn course. RTC 2-3 wks f/u with pcp, discussed return sooner if not improving with this treatment regimen. Pt agrees with plan.

## 2014-09-11 LAB — CBC WITH DIFFERENTIAL/PLATELET
Basophils Absolute: 0 10*3/uL (ref 0.0–0.1)
Basophils Relative: 0.3 % (ref 0.0–3.0)
Eosinophils Absolute: 0.1 10*3/uL (ref 0.0–0.7)
Eosinophils Relative: 1.4 % (ref 0.0–5.0)
HCT: 40.2 % (ref 39.0–52.0)
HEMOGLOBIN: 13.7 g/dL (ref 13.0–17.0)
LYMPHS ABS: 1.8 10*3/uL (ref 0.7–4.0)
LYMPHS PCT: 24.9 % (ref 12.0–46.0)
MCHC: 34 g/dL (ref 30.0–36.0)
MCV: 89.1 fl (ref 78.0–100.0)
MONOS PCT: 7.7 % (ref 3.0–12.0)
Monocytes Absolute: 0.6 10*3/uL (ref 0.1–1.0)
Neutro Abs: 4.8 10*3/uL (ref 1.4–7.7)
Neutrophils Relative %: 65.7 % (ref 43.0–77.0)
PLATELETS: 209 10*3/uL (ref 150.0–400.0)
RBC: 4.52 Mil/uL (ref 4.22–5.81)
RDW: 13.3 % (ref 11.5–15.5)
WBC: 7.4 10*3/uL (ref 4.0–10.5)

## 2014-09-11 LAB — BASIC METABOLIC PANEL
BUN: 12 mg/dL (ref 6–23)
CO2: 32 mEq/L (ref 19–32)
Calcium: 9.9 mg/dL (ref 8.4–10.5)
Chloride: 101 mEq/L (ref 96–112)
Creatinine, Ser: 1.31 mg/dL (ref 0.40–1.50)
GFR: 57.69 mL/min — ABNORMAL LOW (ref >60.00)
GLUCOSE: 120 mg/dL — AB (ref 70–99)
Potassium: 4 mEq/L (ref 3.5–5.1)
SODIUM: 137 meq/L (ref 135–145)

## 2014-09-11 LAB — HEMOGLOBIN A1C: Hgb A1c MFr Bld: 12.2 % — ABNORMAL HIGH (ref 4.6–6.5)

## 2014-09-14 ENCOUNTER — Encounter: Payer: Self-pay | Admitting: Family Medicine

## 2014-09-17 ENCOUNTER — Telehealth: Payer: Self-pay | Admitting: Family Medicine

## 2014-09-17 NOTE — Telephone Encounter (Signed)
Patient returned Kim's call.  Please call patient back on his cell phone.

## 2014-09-17 NOTE — Telephone Encounter (Signed)
Spoke with patient.

## 2014-09-18 DIAGNOSIS — L700 Acne vulgaris: Secondary | ICD-10-CM | POA: Diagnosis not present

## 2014-10-01 DIAGNOSIS — E119 Type 2 diabetes mellitus without complications: Secondary | ICD-10-CM | POA: Diagnosis not present

## 2014-10-01 DIAGNOSIS — H538 Other visual disturbances: Secondary | ICD-10-CM | POA: Diagnosis not present

## 2014-10-01 LAB — HM DIABETES EYE EXAM

## 2014-10-07 ENCOUNTER — Ambulatory Visit (INDEPENDENT_AMBULATORY_CARE_PROVIDER_SITE_OTHER): Payer: Medicare Other | Admitting: Family Medicine

## 2014-10-07 ENCOUNTER — Encounter: Payer: Self-pay | Admitting: Family Medicine

## 2014-10-07 VITALS — BP 120/58 | HR 91 | Temp 97.9°F | Ht 72.0 in | Wt 242.1 lb

## 2014-10-07 DIAGNOSIS — Z87891 Personal history of nicotine dependence: Secondary | ICD-10-CM | POA: Diagnosis not present

## 2014-10-07 DIAGNOSIS — J208 Acute bronchitis due to other specified organisms: Secondary | ICD-10-CM

## 2014-10-07 HISTORY — DX: Personal history of nicotine dependence: Z87.891

## 2014-10-07 MED ORDER — TAMSULOSIN HCL 0.4 MG PO CAPS: 0.4000 mg | ORAL_CAPSULE | Freq: Every day | ORAL | Status: DC

## 2014-10-07 MED ORDER — AZITHROMYCIN 250 MG PO TABS: ORAL_TABLET | ORAL | Status: DC

## 2014-10-07 NOTE — Progress Notes (Signed)
Pre visit review using our clinic review tool, if applicable. No additional management support is needed unless otherwise documented below in the visit note. 

## 2014-10-07 NOTE — Progress Notes (Signed)
Dr. Frederico Hamman T. Habib Kise, MD, [facility] [place], [address] Phone: [phone] Fax: [phone]  10/07/2014  Patient: BRACH BIRDSALL, [name]: [id number], DOB: [birth year], 70 y.o.  Primary Physician:  [name], MD  Chief Complaint: Nasal Congestion  Subjective:   [name] is a 69 y.o. very pleasant male patient who presents with the following:  Patient presents for presents evaluation of myalgias, nasal congestion, productive cough, rhinorrhea , sneezing and sore throat. Symptoms began > 1 week ago and are gradually worsening since that time.    Risk Factors: 100 pack years of prior smoking.   The patient denies significant nausea, vomitting, diarrhea, rash, diffuse arthralgia or myalgia. They also deny high fever.   Past Medical History, Surgical History, Social History, Family History, Problem List, Medications, and Allergies have been reviewed and updated if relevant.  Patient Active Problem List   Diagnosis Date Noted  . Type 1 diabetes mellitus with neurological manifestations, uncontrolled 08/05/2011    Priority: High  . Former very heavy cigarette smoker (more than 40 per day) 10/07/2014    Priority: Medium  . Chronic kidney disease, stage III (moderate) 12/19/2012    Priority: Medium  . OSA (obstructive sleep apnea) 03/09/2011    Priority: Medium  . Diabetic neuropathy 12/29/2010    Priority: Medium  . HYPERLIPIDEMIA 06/13/2008    Priority: Medium  . Major depressive disorder, recurrent episode, in partial remission 06/13/2008    Priority: Medium  . HYPERTENSION 06/13/2008    Priority: Medium  . Right flank pain 09/10/2014  . Tachycardia 09/10/2014  . Back pain 08/22/2013  . Hypogonadism male 06/14/2012  . Hemorrhoids with bleeding (L lateral pile especially) 09/27/2011  . Obesity (BMI 30-39.9) 09/27/2011  . History of smoking 09/16/2010  . Family history of MI (myocardial infarction)  09/16/2010  . HIP REPLACEMENT, RIGHT, HX OF 02/05/2010  . GOUT 06/13/2008  . ALLERGIC RHINITIS 06/13/2008  . GERD 06/13/2008  . OSTEOARTHRITIS 06/13/2008    Past Medical History  Diagnosis Date  . Depression   . Diabetes mellitus   . Hyperlipidemia   . Hypertension   . Osteoarthritis   . Allergic rhinitis due to pollen   . GERD (gastroesophageal reflux disease)   . Gout   . Diverticulosis   . Personal history of colonic polyps     1996  . Low testosterone   . Iron deficiency   . Hypogonadism male 06/14/2012  . Chronic kidney disease, stage III (moderate) 12/19/2012  . Former very heavy cigarette smoker (more than 40 per day) 10/07/2014    Past Surgical History  Procedure Laterality Date  . Cholecystectomy  1980  . Total hip arthroplasty  2009    Dr. Gladstone Lighter  . Total hip arthroplasty    . Cardiac catheterization  11/2011    [facility]    History   Social History  . Marital Status: Married    Spouse Name: N/A  . Number of Children: N/A  . Years of Education: N/A   Occupational History  . Dispatcher   . retired Administrator    Social History Main Topics  . Smoking status: Former Smoker -- 3.00 packs/day for 35 years    Types: Cigarettes    Quit date: 04/12/2000  . Smokeless tobacco: Never Used  . Alcohol Use: No  . Drug Use: No  . Sexual Activity: Not on file   Other Topics Concern  . Not on file  Social History Narrative   No regular exercise   Caffeine use: yes, dt coke    Family History  Problem Relation Age of Onset  . Diabetes Mother   . Diabetes Father   . Lung cancer Sister     lung  . Heart disease Maternal Grandfather   . COPD Sister   . Heart attack Sister   . Heart disease Sister     Allergies  Allergen Reactions  . Tetracycline Itching and Rash    Medication list reviewed and updated in full in [place].  ROS: GEN: Acute illness details above GI: Tolerating PO intake GU: maintaining adequate hydration and urination Pulm:  No SOB Interactive and getting along well at home.  Otherwise, ROS is as per the HPI.   Objective:   BP 120/58 mmHg  Pulse 91  Temp(Src) 97.9 F (36.6 C) (Oral)  Ht 6' (1.829 m)  Wt 242 lb 1.9 oz (109.825 kg)  BMI 32.83 kg/m2  SpO2 94%   GEN: A and O x 3. WDWN. NAD.    ENT: Nose clear, ext NML.  No LAD.  No JVD.  TM's clear. Oropharynx clear.  PULM: Normal WOB, no distress. No crackles, scattered wheezes, no rhonchi. CV: RRR, no M/G/R, No rubs, No JVD.   EXT: warm and well-perfused, No c/c/e. PSYCH: Pleasant and conversant.   Laboratory and Imaging Data:  Assessment and Plan:   Acute bronchitis due to other specified organisms  Former very heavy cigarette smoker (more than 40 per day)  At this point, we reviewed supportive care. Given the length of time and risk factors, treat with medications below. Likely has some mild copd at least Medical decision making includes all plans, orders, medications, and patient instructions reviewed face to face.   He also has been having some urinary flow issues  Follow-up: No Follow-up on file.  New Prescriptions   AZITHROMYCIN (ZITHROMAX) 250 MG TABLET    2 tabs po on day 1, then 1 tab po for 4 days   TAMSULOSIN (FLOMAX) 0.4 MG CAPS CAPSULE    Take 1 capsule (0.4 mg total) by mouth daily.   No orders of the defined types were placed in this encounter.    Signed,  Maud Deed. Irl Bodie, MD   Patient's Medications  New Prescriptions   AZITHROMYCIN (ZITHROMAX) 250 MG TABLET    2 tabs po on day 1, then 1 tab po for 4 days   TAMSULOSIN (FLOMAX) 0.4 MG CAPS CAPSULE    Take 1 capsule (0.4 mg total) by mouth daily.  Previous Medications   ACETAMINOPHEN (TYLENOL) 500 MG TABLET    Take 1,000 mg by mouth every 6 (six) hours as needed for moderate pain.   AMPICILLIN (PRINCIPEN) 500 MG CAPSULE    Take 1 capsule by mouth daily.   ASPIRIN 81 MG CHEWABLE TABLET    Chew 81 mg by mouth at bedtime.    ATENOLOL (TENORMIN) 100 MG TABLET     TAKE ONE TABLET BY MOUTH ONCE DAILY   B COMPLEX VITAMINS TABLET    Take 1 tablet by mouth daily.     CHOLECALCIFEROL (VITAMIN D) 1000 UNITS TABLET    Take 1,000 Units by mouth daily.     DOXAZOSIN (CARDURA) 8 MG TABLET    Take 1 tablet (8 mg total) by mouth at bedtime.   FAMOTIDINE (PEPCID) 10 MG TABLET    Take 10 mg by mouth daily.     FLUTICASONE (FLONASE) 50 MCG/ACT NASAL SPRAY  INHALE 2 SPRAYS INTO EACH NOSTRIL EVERY DAY. AS NEEDED   INSULIN NPH HUMAN (HUMULIN N,NOVOLIN N) 100 UNIT/ML INJECTION    Inject 0.35 mLs (35 Units total) into the skin 3 (three) times daily.   INSULIN REGULAR (NOVOLIN R,HUMULIN R) 100 UNITS/ML INJECTION    Inject 0.35 mLs (35 Units total) into the skin 3 (three) times daily before meals.   LISINOPRIL-HYDROCHLOROTHIAZIDE (PRINZIDE,ZESTORETIC) 20-12.5 MG PER TABLET    TAKE ONE TABLET BY MOUTH ONCE DAILY   NAPROXEN (NAPROSYN) 500 MG TABLET    Take one po bid x 5 days then prn pain, take with food   NYSTATIN CREAM (MYCOSTATIN)    Apply topically 2 (two) times daily as needed.   SIMVASTATIN (ZOCOR) 40 MG TABLET    TAKE ONE TABLET BY MOUTH EVERY NIGHT AT BEDTIME   VENLAFAXINE XR (EFFEXOR-XR) 75 MG 24 HR CAPSULE    TAKE THREE (3) CAPSULES BY MOUTH DAILY  Modified Medications   No medications on file  Discontinued Medications   No medications on file

## 2014-10-17 ENCOUNTER — Encounter: Payer: Self-pay | Admitting: Family Medicine

## 2014-10-17 ENCOUNTER — Ambulatory Visit (INDEPENDENT_AMBULATORY_CARE_PROVIDER_SITE_OTHER): Payer: Medicare Other | Admitting: Family Medicine

## 2014-10-17 VITALS — BP 120/58 | HR 100 | Temp 97.7°F | Ht 72.0 in | Wt 243.1 lb

## 2014-10-17 DIAGNOSIS — R5383 Other fatigue: Secondary | ICD-10-CM

## 2014-10-17 DIAGNOSIS — E291 Testicular hypofunction: Secondary | ICD-10-CM

## 2014-10-17 DIAGNOSIS — E1041 Type 1 diabetes mellitus with diabetic mononeuropathy: Secondary | ICD-10-CM

## 2014-10-17 DIAGNOSIS — IMO0002 Reserved for concepts with insufficient information to code with codable children: Secondary | ICD-10-CM

## 2014-10-17 DIAGNOSIS — E1065 Type 1 diabetes mellitus with hyperglycemia: Principal | ICD-10-CM

## 2014-10-17 DIAGNOSIS — E1049 Type 1 diabetes mellitus with other diabetic neurological complication: Secondary | ICD-10-CM

## 2014-10-17 NOTE — Patient Instructions (Signed)
REFERRALS TO SPECIALISTS, SPECIAL TESTS (MRI, CT, ULTRASOUNDS)  [facility] or [facility] will help you. ASK CHECK-IN FOR HELP.  Imaging / Special Testing referrals sometimes can be done same day if EMERGENCY, but others can take 2 or 3 days to get an appointment. Starting in 2015, many of the new Medicare plans and Obamacare plans take much longer.   Specialist appointment times vary a great deal, based on their schedule / openings. -- Some specialists have very long wait times. (Example. Dermatology. Multiple months  for non-cancer)   

## 2014-10-17 NOTE — Progress Notes (Signed)
Dr. Frederico Hamman T. Billee Balcerzak, MD, [facility] [place], [address] Phone: [phone] Fax: [phone]  10/17/2014  Patient: [name], [name]: [id number], DOB: Mar 21, 1946, 69 y.o.  Primary Physician:  [name], MD  Chief Complaint: Other  Subjective:   [name] is a 69 y.o. very pleasant male patient who presents with the following:  The patient comes in with a primary complaint of just not feeling very well.  He is an insulin-dependent diabetic, and his A1c is over 12.  As I review the records, he has not seen his endocrinologist in quite a long time.  He does take his insulin daily, and his diet is intermittent as.  It is been this way for really years.  Patient is also Weight: 243 lb 1.9 oz (110.279 kg)   Height: 6' (182.9 cm)   Body mass index is 32.97 kg/(m^2).   Lab Results  Component Value Date   HGBA1C 12.2* 09/10/2014    He has sleep apnea, but reports being compliant with his CPAP machine.  He also has a history of significant hypergonadism, and he elected to stop doing any treatment for this a couple of years ago.  We haven't checked his testosterone levels in quite some time.  Having some diarrhea.  Sits down for a few minutes and feels like needs to go to sleep.   Using CPAP.  Still dribbling with going to the bathroom  Past Medical History, Surgical History, Social History, Family History, Problem List, Medications, and Allergies have been reviewed and updated if relevant.  Patient Active Problem List   Diagnosis Date Noted  . Type 1 diabetes mellitus with neurological manifestations, uncontrolled 08/05/2011    Priority: High  . Former very heavy cigarette smoker (more than 40 per day) 10/07/2014    Priority: Medium  . Chronic kidney disease, stage III (moderate) 12/19/2012    Priority: Medium  . OSA (obstructive sleep apnea) 03/09/2011    Priority: Medium  . Diabetic neuropathy  12/29/2010    Priority: Medium  . HYPERLIPIDEMIA 06/13/2008    Priority: Medium  . Major depressive disorder, recurrent episode, in partial remission 06/13/2008    Priority: Medium  . HYPERTENSION 06/13/2008    Priority: Medium  . Right flank pain 09/10/2014  . Tachycardia 09/10/2014  . Back pain 08/22/2013  . Hypogonadism male 06/14/2012  . Hemorrhoids with bleeding (L lateral pile especially) 09/27/2011  . Obesity (BMI 30-39.9) 09/27/2011  . History of smoking 09/16/2010  . Family history of MI (myocardial infarction) 09/16/2010  . HIP REPLACEMENT, RIGHT, HX OF 02/05/2010  . GOUT 06/13/2008  . ALLERGIC RHINITIS 06/13/2008  . GERD 06/13/2008  . OSTEOARTHRITIS 06/13/2008    Past Medical History  Diagnosis Date  . Depression   . Diabetes mellitus   . Hyperlipidemia   . Hypertension   . Osteoarthritis   . Allergic rhinitis due to pollen   . GERD (gastroesophageal reflux disease)   . Gout   . Diverticulosis   . Personal history of colonic polyps     1996  . Low testosterone   . Iron deficiency   . Hypogonadism male 06/14/2012  . Chronic kidney disease, stage III (moderate) 12/19/2012  . Former very heavy cigarette smoker (more than 40 per day) 10/07/2014    Past Surgical History  Procedure Laterality Date  . Cholecystectomy  1980  . Total hip arthroplasty  2009    Dr. Gladstone Lighter  . Total  hip arthroplasty    . Cardiac catheterization  11/2011    [facility]    History   Social History  . Marital Status: Married    Spouse Name: N/A  . Number of Children: N/A  . Years of Education: N/A   Occupational History  . Dispatcher   . retired Administrator    Social History Main Topics  . Smoking status: Former Smoker -- 3.00 packs/day for 35 years    Types: Cigarettes    Quit date: 04/12/2000  . Smokeless tobacco: Never Used  . Alcohol Use: No  . Drug Use: No  . Sexual Activity: Not on file   Other Topics Concern  . Not on file   Social History Narrative   No regular  exercise   Caffeine use: yes, dt coke    Family History  Problem Relation Age of Onset  . Diabetes Mother   . Diabetes Father   . Lung cancer Sister     lung  . Heart disease Maternal Grandfather   . COPD Sister   . Heart attack Sister   . Heart disease Sister     Allergies  Allergen Reactions  . Tetracycline Itching and Rash    Medication list reviewed and updated in full in [facility].   GEN: No acute illnesses, no fevers, chills. GI: No n/v/d, eating normally Pulm: No SOB Interactive and getting along well at home.  Otherwise, ROS is as per the HPI.  Objective:   BP 120/58 mmHg  Pulse 100  Temp(Src) 97.7 F (36.5 C) (Oral)  Ht 6' (1.829 m)  Wt 243 lb 1.9 oz (110.279 kg)  BMI 32.97 kg/m2  SpO2 92%  GEN: WDWN, NAD, Non-toxic, A & O x 3 HEENT: Atraumatic, Normocephalic. Neck supple. No masses, No LAD. Ears and Nose: No external deformity. CV: RRR, No M/G/R. No JVD. No thrill. No extra heart sounds. PULM: CTA B, no wheezes, crackles, rhonchi. No retractions. No resp. distress. No accessory muscle use. EXTR: No c/c/e NEURO Normal gait.  PSYCH: Normally interactive. Conversant. Not depressed or anxious appearing.  Calm demeanor.   Laboratory and Imaging Data: Results for orders placed or performed in visit on 10/07/14  HM DIABETES EYE EXAM  Result Value Ref Range   HM Diabetic Eye Exam No Retinopathy No Retinopathy    Lab Results  Component Value Date   TSH 1.31 10/18/2014     Assessment and Plan:   Type 1 diabetes mellitus with neurological manifestations, uncontrolled - Plan: Ambulatory referral to Endocrinology  Hypogonadism male - Plan: Testosterone, Free, Total, SHBG  Other fatigue - Plan: Testosterone, Free, Total, SHBG, TSH  Multiple things could be causing his symptoms.  His diabetes is wildly out of control.  I'm going to have him follow-up with his endocrinologist, and I recommended to the [facility] to very slowly increase his insulin  dosing by only one unit at a time until his blood sugars postprandial are less than 150.  This may also be coming from his hypogonadism.  He is going to come in and get a fasting testosterone in the morning.  New Prescriptions   No medications on file   Orders Placed This Encounter  Procedures  . Testosterone, Free, Total, SHBG  . TSH  . Ambulatory referral to Endocrinology    Signed,  Maud Deed. Xiomara Sevillano, MD   Patient's Medications  New Prescriptions   No medications on file  Previous Medications   ACETAMINOPHEN (TYLENOL) 500 MG TABLET  Take 1,000 mg by mouth every 6 (six) hours as needed for moderate pain.   AMPICILLIN (PRINCIPEN) 500 MG CAPSULE    Take 1 capsule by mouth daily.   ASPIRIN 81 MG CHEWABLE TABLET    Chew 81 mg by mouth at bedtime.    ATENOLOL (TENORMIN) 100 MG TABLET    TAKE ONE TABLET BY MOUTH ONCE DAILY   AZITHROMYCIN (ZITHROMAX) 250 MG TABLET    2 tabs po on day 1, then 1 tab po for 4 days   B COMPLEX VITAMINS TABLET    Take 1 tablet by mouth daily.     CHOLECALCIFEROL (VITAMIN D) 1000 UNITS TABLET    Take 1,000 Units by mouth daily.     DOXAZOSIN (CARDURA) 8 MG TABLET    Take 1 tablet (8 mg total) by mouth at bedtime.   FAMOTIDINE (PEPCID) 10 MG TABLET    Take 10 mg by mouth daily.     FLUTICASONE (FLONASE) 50 MCG/ACT NASAL SPRAY    INHALE 2 SPRAYS INTO EACH NOSTRIL EVERY DAY. AS NEEDED   INSULIN NPH HUMAN (HUMULIN N,NOVOLIN N) 100 UNIT/ML INJECTION    Inject 0.35 mLs (35 Units total) into the skin 3 (three) times daily.   INSULIN REGULAR (NOVOLIN R,HUMULIN R) 100 UNITS/ML INJECTION    Inject 0.35 mLs (35 Units total) into the skin 3 (three) times daily before meals.   LISINOPRIL-HYDROCHLOROTHIAZIDE (PRINZIDE,ZESTORETIC) 20-12.5 MG PER TABLET    TAKE ONE TABLET BY MOUTH ONCE DAILY   NAPROXEN (NAPROSYN) 500 MG TABLET    Take one po bid x 5 days then prn pain, take with food   NYSTATIN CREAM (MYCOSTATIN)    Apply topically 2 (two) times daily as needed.    SIMVASTATIN (ZOCOR) 40 MG TABLET    TAKE ONE TABLET BY MOUTH EVERY NIGHT AT BEDTIME   TAMSULOSIN (FLOMAX) 0.4 MG CAPS CAPSULE    Take 1 capsule (0.4 mg total) by mouth daily.   VENLAFAXINE XR (EFFEXOR-XR) 75 MG 24 HR CAPSULE    TAKE THREE (3) CAPSULES BY MOUTH DAILY  Modified Medications   No medications on file  Discontinued Medications   No medications on file

## 2014-10-18 ENCOUNTER — Other Ambulatory Visit (INDEPENDENT_AMBULATORY_CARE_PROVIDER_SITE_OTHER): Payer: Medicare Other

## 2014-10-18 DIAGNOSIS — R5383 Other fatigue: Secondary | ICD-10-CM | POA: Diagnosis not present

## 2014-10-18 DIAGNOSIS — E291 Testicular hypofunction: Secondary | ICD-10-CM

## 2014-10-18 LAB — TSH: TSH: 1.31 u[IU]/mL (ref 0.35–4.50)

## 2014-10-21 LAB — TESTOSTERONE, FREE, TOTAL, SHBG
Sex Hormone Binding: 10 nmol/L — ABNORMAL LOW (ref 22–77)
TESTOSTERONE: 149 ng/dL — AB (ref 300–890)
Testosterone, Free: 46.5 pg/mL — ABNORMAL LOW (ref 47.0–244.0)
Testosterone-% Free: 3.1 % — ABNORMAL HIGH (ref 1.6–2.9)

## 2014-10-24 ENCOUNTER — Encounter: Payer: Self-pay | Admitting: Internal Medicine

## 2014-10-24 ENCOUNTER — Ambulatory Visit (INDEPENDENT_AMBULATORY_CARE_PROVIDER_SITE_OTHER): Payer: Medicare Other | Admitting: Internal Medicine

## 2014-10-24 VITALS — BP 118/64 | HR 93 | Temp 97.5°F | Resp 12 | Wt 246.0 lb

## 2014-10-24 DIAGNOSIS — E1049 Type 1 diabetes mellitus with other diabetic neurological complication: Secondary | ICD-10-CM

## 2014-10-24 DIAGNOSIS — E1041 Type 1 diabetes mellitus with diabetic mononeuropathy: Secondary | ICD-10-CM

## 2014-10-24 DIAGNOSIS — E1065 Type 1 diabetes mellitus with hyperglycemia: Principal | ICD-10-CM

## 2014-10-24 DIAGNOSIS — IMO0002 Reserved for concepts with insufficient information to code with codable children: Secondary | ICD-10-CM

## 2014-10-24 MED ORDER — INSULIN REGULAR HUMAN 100 UNIT/ML IJ SOLN: INTRAMUSCULAR | Status: DC

## 2014-10-24 MED ORDER — INSULIN NPH (HUMAN) (ISOPHANE) 100 UNIT/ML ~~LOC~~ SUSP: 50.0000 [IU] | Freq: Three times a day (TID) | SUBCUTANEOUS | Status: DC

## 2014-10-24 NOTE — Patient Instructions (Addendum)
Please change the insulin doses as follows:  Type of insulin Breakfast Lunch Dinner  Humulin R 50 30 50  Humulin N 50  50   Please inject 30 min before every main meal. Do not skip doses. Check sugars at least 3x a day before meals and sometimes at bedtime.  Please return in 1 months with your sugar log.

## 2014-10-24 NOTE — Progress Notes (Signed)
Patient ID: [name], male   DOB: Sep 28, 1945, 69 y.o.   [name]: [id number]  HPI: [name] is a 69 y.o.-year-old male, returning for f/u for DM2, dx 1997, insulin-dependent since 2010, uncontrolled, with complications (CKD stage 3, PN). Last visit was 10 mo ago!   Last hemoglobin A1c was: Lab Results  Component Value Date   HGBA1C 12.2* 09/10/2014   HGBA1C 12.0* 12/12/2013   HGBA1C 12.8* 06/06/2013   He is on: Type of insulin Breakfast Lunch Dinner  Humulin R 50  50  Humulin N 50  50  He does not have problems mixing the 2 insulins. They are cheap: $24 per bottle from [facility], while they were $200 at [facility]! Gets insulin w/o Rx's.  These are not the doses that I suggested at last visit: Type of insulin Breakfast Lunch Dinner  Humulin R 30 >> 33 35 >> 30 30 >> 33  Humulin N 40  35   Pt checks his sugars once a day in am (forgot log) and they are: - am: 150-170 >> 250 >> 130-140 (when takes the insulin correctly) >> 300-400s >> 168, 250-350 - 2h after b'fast: n/c  - before lunch: n/c - 2h after lunch: n/c >> 300s >> n/c - before dinner: n/c - 2h after dinner: n/c - bedtime: n/c  - nighttime: 60s, wakes him up >> n/c Had one low last week >> felt it, did not check; he has hypoglycemia awareness at 100.  Highest sugar was Hi.  Pt's meals are (he reduced bread, uses whole wheat now): - Breakfast: 2 slices buttered toast - Lunch: ham sandwich + chips - Dinner: meat + veggies + starch - Snacks: 4-5 x a day  - has CKD, last BUN/creatinine:  Lab Results  Component Value Date   BUN 12 09/10/2014   CREATININE 1.31 09/10/2014  Last ACR 4 on 12/12/2012. On Lisinopril 20.  - last set of lipids: Lab Results  Component Value Date   CHOL 159 12/12/2013   HDL 29.30* 12/12/2013   LDLCALC 43 04/24/2009   LDLDIRECT 64.1 12/12/2013   TRIG 635.0* 12/12/2013   CHOLHDL 5 12/12/2013  On Zocor 40. - last eye exam was in 10/01/2014. No DR.  -+ numbness and tingling in his feet,  especially at night.  I reviewed pt's medications, allergies, PMH, social hx, family hx, and changes were documented in the history of present illness. Otherwise, unchanged from my initial visit note.  ROS: Constitutional: no weight gain/loss, no fatigue, no subjective hyperthermia/hypothermia, + nocturia x1-2 Eyes: no blurry vision, no xerophthalmia ENT: no sore throat, no nodules palpated in throat, no dysphagia/odynophagia, + tinnitus Cardiovascular: no CP/SOB/no palpitations/+ leg swelling Respiratory: + cough/no SOB Gastrointestinal: no N/V/D/C/+ heartburn Musculoskeletal: no muscle/no joint aches Skin: no rashes, + easy bruising Neurological: no tremors/numbness/tingling/dizziness, + HA + diff with erections, + low libido  PE: BP 118/64 mmHg  Pulse 93  Temp(Src) 97.5 F (36.4 C) (Oral)  Resp 12  Wt 246 lb (111.585 kg)  SpO2 96% Body mass index is 33.36 kg/(m^2). Wt Readings from Last 3 Encounters:  10/24/14 246 lb (111.585 kg)  10/17/14 243 lb 1.9 oz (110.279 kg)  10/07/14 242 lb 1.9 oz (109.825 kg)   Constitutional: overweight, in NAD Eyes: PERRLA, EOMI, no exophthalmos ENT: moist mucous membranes, no thyromegaly, no cervical lymphadenopathy, + tinnitus Cardiovascular: RRR, No MRG, + visible varicose veins L leg Respiratory: CTA B Gastrointestinal: abdomen soft, NT, ND, BS+ Musculoskeletal: no deformities, strength intact in all  4 Skin: moist, warm, no rashes  ASSESSMENT: 1. DM2, insulin-dependent, uncontrolled, with complications - noncompliance with visits, insulin doses, CBG checks - CKD stage 3 - PN  PLAN:  1. Patient with long-standing, uncontrolled diabetes, returning after a long absence. He is now on a basal-bolus NPH and regular insulin regimen. Still not checking sugars consistently but he mentions he is not forgetting insulin doses now. He changed his regimen from tid to bid and increased insulin doses. I suggested to start U500 insulin as he is using  now 200 units insulin a day >> refuses >> wants to first try to add back mid-day R insulin bolus >> will try this for 1 mo, but I doubt this will be enough to help ctrl his hyperglycemia. I also advised him to start checking sugars tid instead of once a day. - I suggested to:  Patient Instructions   Please change the insulin doses as follows:  Type of insulin Breakfast Lunch Dinner  Humulin R 50 30 50  Humulin N 50  50   Please inject 30 min before every main meal. Do not skip doses. Check sugars at least 3x a day before meals and sometimes at bedtime.  Please return in 1 month with your sugar log.   - continue checking sugars at different times of the day - check 3 times a day, rotating checks - reviewed last HbA1c >> very high! - given new sugar logs  - UTD with eye exams

## 2014-10-29 ENCOUNTER — Telehealth: Payer: Self-pay | Admitting: Family Medicine

## 2014-10-29 NOTE — Telephone Encounter (Signed)
Pt requesting call back, thanks

## 2014-10-29 NOTE — Telephone Encounter (Signed)
Pt returned your call, please call back, thanks

## 2014-10-29 NOTE — Telephone Encounter (Signed)
Spoke with [name].  He states he called [facility] and they do not administer testosterone shots and wants to know how that will be done.  I advised that once Dr. Lorelei Pont sends in prescription for Testosterone, then we will schedule him a nurse visit here at the office to get the injections.  He will just need to pick up the medication at [facility] and bring it in with him to his nurse visits.  I will call [name] to set up nurse visits once Dr. Lorelei Pont sends in prescription.  He is aware that Dr. Lorelei Pont is out of the office until next week.

## 2014-10-29 NOTE — Telephone Encounter (Signed)
See Result Note on 10/18/2014 labs.

## 2014-10-30 ENCOUNTER — Other Ambulatory Visit: Payer: Self-pay | Admitting: Family Medicine

## 2014-10-30 MED ORDER — TESTOSTERONE CYPIONATE 100 MG/ML IM SOLN: 100.0000 mg | INTRAMUSCULAR | Status: DC

## 2014-10-30 NOTE — Telephone Encounter (Addendum)
Spoke with Mr. Moist.  He would rather come to office every 2 weeks for Nurse Visit to get his testosterone injections.  He states he is not comfortable doing IM injections on himself.  Please sent in Rx to [facility].

## 2014-10-30 NOTE — Telephone Encounter (Addendum)
I have called [facility] and told them to cancel the syringes and needle prescription that Dr. Lorelei Pont called in since patient will be coming to office for injections.  Also received fax from [facility] requesting Prior Authorization for the testosterone. PA completed via CoverMyMeds.  Once PA is approved then I will call [name] to schedule his nurse visits for his injections.

## 2014-10-30 NOTE — Telephone Encounter (Signed)
Received letter from [employer].  Testosterone is on the plan's list of covered medications.  Letter faxed to [facility].  Nurse visit scheduled for 10/31/2014 at 11:00 am for Testosterone Injection.

## 2014-10-30 NOTE — Telephone Encounter (Signed)
Pt left vm returning call and requesting cb about injections.

## 2014-10-30 NOTE — Telephone Encounter (Signed)
I just wanted to clarify if there was confusion.  [name] already gives himself insulin shots.   He can give himself an IM testosterone shot every 2 weeks. Since he already is giving himself shots all the time, I thought that he would not have a problem doing that at home. Just rather than SubCut like insulin you put this into the muscle.  Electronically Signed  By: [name], MD On: 10/30/2014 9:21 AM

## 2014-10-30 NOTE — Telephone Encounter (Signed)
i already called it in. (along with needles and syringes which we won't need)  Entirely up to him if that is his preference.

## 2014-10-31 ENCOUNTER — Ambulatory Visit (INDEPENDENT_AMBULATORY_CARE_PROVIDER_SITE_OTHER): Payer: Medicare Other

## 2014-10-31 DIAGNOSIS — E291 Testicular hypofunction: Secondary | ICD-10-CM

## 2014-10-31 MED ORDER — TESTOSTERONE CYPIONATE 100 MG/ML IM SOLN
100.0000 mg | INTRAMUSCULAR | Status: DC
  Administered 2014-10-31: 100 mg via INTRAMUSCULAR

## 2014-11-03 NOTE — Telephone Encounter (Signed)
noted 

## 2014-11-14 ENCOUNTER — Ambulatory Visit (INDEPENDENT_AMBULATORY_CARE_PROVIDER_SITE_OTHER): Payer: Medicare Other

## 2014-11-14 DIAGNOSIS — E291 Testicular hypofunction: Secondary | ICD-10-CM | POA: Diagnosis not present

## 2014-11-14 MED ORDER — TESTOSTERONE CYPIONATE 100 MG/ML IM SOLN
100.0000 mg | INTRAMUSCULAR | Status: DC
  Administered 2014-11-14: 100 mg via INTRAMUSCULAR

## 2014-11-28 ENCOUNTER — Ambulatory Visit (INDEPENDENT_AMBULATORY_CARE_PROVIDER_SITE_OTHER): Payer: Medicare Other | Admitting: *Deleted

## 2014-11-28 DIAGNOSIS — E291 Testicular hypofunction: Secondary | ICD-10-CM

## 2014-11-28 MED ORDER — TESTOSTERONE CYPIONATE 100 MG/ML IM SOLN
100.0000 mg | INTRAMUSCULAR | Status: DC
  Administered 2014-11-28: 100 mg via INTRAMUSCULAR

## 2014-12-04 ENCOUNTER — Ambulatory Visit: Payer: Medicare Other | Admitting: Family Medicine

## 2014-12-05 ENCOUNTER — Ambulatory Visit (INDEPENDENT_AMBULATORY_CARE_PROVIDER_SITE_OTHER): Payer: Medicare Other | Admitting: Family Medicine

## 2014-12-05 ENCOUNTER — Emergency Department (HOSPITAL_COMMUNITY): Payer: Medicare Other

## 2014-12-05 ENCOUNTER — Encounter (HOSPITAL_COMMUNITY): Payer: Self-pay | Admitting: Neurology

## 2014-12-05 ENCOUNTER — Telehealth: Payer: Self-pay | Admitting: *Deleted

## 2014-12-05 ENCOUNTER — Emergency Department (HOSPITAL_COMMUNITY)
Admission: EM | Admit: 2014-12-05 | Discharge: 2014-12-06 | Disposition: A | Discharge status: Home / Self Care | Payer: Medicare Other | Attending: Emergency Medicine | Admitting: Emergency Medicine

## 2014-12-05 ENCOUNTER — Encounter: Payer: Self-pay | Admitting: Family Medicine

## 2014-12-05 VITALS — BP 120/60 | HR 78 | Temp 97.9°F | Wt 254.0 lb

## 2014-12-05 DIAGNOSIS — Z87891 Personal history of nicotine dependence: Secondary | ICD-10-CM | POA: Insufficient documentation

## 2014-12-05 DIAGNOSIS — R410 Disorientation, unspecified: Secondary | ICD-10-CM | POA: Diagnosis not present

## 2014-12-05 DIAGNOSIS — H579 Unspecified disorder of eye and adnexa: Secondary | ICD-10-CM

## 2014-12-05 DIAGNOSIS — Z7951 Long term (current) use of inhaled steroids: Secondary | ICD-10-CM | POA: Diagnosis not present

## 2014-12-05 DIAGNOSIS — R51 Headache: Secondary | ICD-10-CM

## 2014-12-05 DIAGNOSIS — D509 Iron deficiency anemia, unspecified: Secondary | ICD-10-CM | POA: Insufficient documentation

## 2014-12-05 DIAGNOSIS — I129 Hypertensive chronic kidney disease with stage 1 through stage 4 chronic kidney disease, or unspecified chronic kidney disease: Secondary | ICD-10-CM | POA: Diagnosis not present

## 2014-12-05 DIAGNOSIS — Z79899 Other long term (current) drug therapy: Secondary | ICD-10-CM | POA: Diagnosis not present

## 2014-12-05 DIAGNOSIS — Z7982 Long term (current) use of aspirin: Secondary | ICD-10-CM | POA: Insufficient documentation

## 2014-12-05 DIAGNOSIS — H539 Unspecified visual disturbance: Secondary | ICD-10-CM | POA: Diagnosis not present

## 2014-12-05 DIAGNOSIS — K219 Gastro-esophageal reflux disease without esophagitis: Secondary | ICD-10-CM | POA: Diagnosis not present

## 2014-12-05 DIAGNOSIS — E785 Hyperlipidemia, unspecified: Secondary | ICD-10-CM | POA: Insufficient documentation

## 2014-12-05 DIAGNOSIS — R0789 Other chest pain: Secondary | ICD-10-CM

## 2014-12-05 DIAGNOSIS — F800 Phonological disorder: Secondary | ICD-10-CM | POA: Diagnosis not present

## 2014-12-05 DIAGNOSIS — Z794 Long term (current) use of insulin: Secondary | ICD-10-CM | POA: Diagnosis not present

## 2014-12-05 DIAGNOSIS — Z789 Other specified health status: Secondary | ICD-10-CM

## 2014-12-05 DIAGNOSIS — R519 Headache, unspecified: Secondary | ICD-10-CM

## 2014-12-05 DIAGNOSIS — M109 Gout, unspecified: Secondary | ICD-10-CM | POA: Insufficient documentation

## 2014-12-05 DIAGNOSIS — H538 Other visual disturbances: Secondary | ICD-10-CM | POA: Insufficient documentation

## 2014-12-05 DIAGNOSIS — E119 Type 2 diabetes mellitus without complications: Secondary | ICD-10-CM | POA: Diagnosis not present

## 2014-12-05 DIAGNOSIS — N183 Chronic kidney disease, stage 3 (moderate): Secondary | ICD-10-CM | POA: Diagnosis not present

## 2014-12-05 DIAGNOSIS — R4182 Altered mental status, unspecified: Secondary | ICD-10-CM | POA: Diagnosis present

## 2014-12-05 DIAGNOSIS — H2513 Age-related nuclear cataract, bilateral: Secondary | ICD-10-CM | POA: Diagnosis not present

## 2014-12-05 LAB — DIFFERENTIAL
BASOS ABS: 0 10*3/uL (ref 0.0–0.1)
BASOS PCT: 0 % (ref 0–1)
Eosinophils Absolute: 0 10*3/uL (ref 0.0–0.7)
Eosinophils Relative: 0 % (ref 0–5)
Lymphocytes Relative: 10 % — ABNORMAL LOW (ref 12–46)
Lymphs Abs: 1.2 10*3/uL (ref 0.7–4.0)
MONO ABS: 1 10*3/uL (ref 0.1–1.0)
Monocytes Relative: 9 % (ref 3–12)
NEUTROS ABS: 9.3 10*3/uL — AB (ref 1.7–7.7)
Neutrophils Relative %: 81 % — ABNORMAL HIGH (ref 43–77)

## 2014-12-05 LAB — CBC WITH DIFFERENTIAL/PLATELET
BASOS PCT: 0.3 % (ref 0.0–3.0)
Basophils Absolute: 0 10*3/uL (ref 0.0–0.1)
EOS PCT: 1 % (ref 0.0–5.0)
Eosinophils Absolute: 0.1 10*3/uL (ref 0.0–0.7)
HEMATOCRIT: 42.3 % (ref 39.0–52.0)
HEMOGLOBIN: 14.4 g/dL (ref 13.0–17.0)
LYMPHS PCT: 21.5 % (ref 12.0–46.0)
Lymphs Abs: 1.8 10*3/uL (ref 0.7–4.0)
MCHC: 34 g/dL (ref 30.0–36.0)
MCV: 89.8 fl (ref 78.0–100.0)
MONOS PCT: 10.4 % (ref 3.0–12.0)
Monocytes Absolute: 0.8 10*3/uL (ref 0.1–1.0)
Neutro Abs: 5.5 10*3/uL (ref 1.4–7.7)
Neutrophils Relative %: 66.8 % (ref 43.0–77.0)
Platelets: 187 10*3/uL (ref 150.0–400.0)
RBC: 4.71 Mil/uL (ref 4.22–5.81)
RDW: 13.4 % (ref 11.5–15.5)
WBC: 8.2 10*3/uL (ref 4.0–10.5)

## 2014-12-05 LAB — I-STAT CHEM 8, ED
BUN: 22 mg/dL — ABNORMAL HIGH (ref 6–20)
CHLORIDE: 97 mmol/L — AB (ref 101–111)
Calcium, Ion: 1.15 mmol/L (ref 1.13–1.30)
Creatinine, Ser: 1.3 mg/dL — ABNORMAL HIGH (ref 0.61–1.24)
GLUCOSE: 105 mg/dL — AB (ref 65–99)
HEMATOCRIT: 44 % (ref 39.0–52.0)
Hemoglobin: 15 g/dL (ref 13.0–17.0)
POTASSIUM: 4.2 mmol/L (ref 3.5–5.1)
SODIUM: 136 mmol/L (ref 135–145)
TCO2: 27 mmol/L (ref 0–100)

## 2014-12-05 LAB — CBC
HCT: 41.1 % (ref 39.0–52.0)
Hemoglobin: 14.4 g/dL (ref 13.0–17.0)
MCH: 31 pg (ref 26.0–34.0)
MCHC: 35 g/dL (ref 30.0–36.0)
MCV: 88.4 fL (ref 78.0–100.0)
PLATELETS: 164 10*3/uL (ref 150–400)
RBC: 4.65 MIL/uL (ref 4.22–5.81)
RDW: 12.7 % (ref 11.5–15.5)
WBC: 11.5 10*3/uL — AB (ref 4.0–10.5)

## 2014-12-05 LAB — PROTIME-INR
INR: 1.02 (ref 0.00–1.49)
PROTHROMBIN TIME: 13.6 s (ref 11.6–15.2)

## 2014-12-05 LAB — COMPREHENSIVE METABOLIC PANEL
ALBUMIN: 4.2 g/dL (ref 3.5–5.2)
ALK PHOS: 67 U/L (ref 39–117)
ALT: 23 U/L (ref 0–53)
ALT: 26 U/L (ref 17–63)
AST: 22 U/L (ref 0–37)
AST: 30 U/L (ref 15–41)
Albumin: 4.1 g/dL (ref 3.5–5.0)
Alkaline Phosphatase: 70 U/L (ref 38–126)
Anion gap: 9 (ref 5–15)
BUN: 18 mg/dL (ref 6–23)
BUN: 15 mg/dL (ref 6–20)
CHLORIDE: 99 meq/L (ref 96–112)
CHLORIDE: 97 mmol/L — AB (ref 101–111)
CO2: 31 mEq/L (ref 19–32)
CO2: 29 mmol/L (ref 22–32)
CREATININE: 1.37 mg/dL — AB (ref 0.61–1.24)
Calcium: 9.7 mg/dL (ref 8.4–10.5)
Calcium: 9.4 mg/dL (ref 8.9–10.3)
Creatinine, Ser: 1.31 mg/dL (ref 0.40–1.50)
GFR calc non Af Amer: 51 mL/min — ABNORMAL LOW (ref >60)
GFR, EST AFRICAN AMERICAN: 59 mL/min — AB (ref >60)
GFR: 57.65 mL/min — AB (ref >60.00)
Glucose, Bld: 85 mg/dL (ref 70–99)
Glucose, Bld: 105 mg/dL — ABNORMAL HIGH (ref 65–99)
POTASSIUM: 4.2 mmol/L (ref 3.5–5.1)
Potassium: 4.2 mEq/L (ref 3.5–5.1)
SODIUM: 135 mmol/L (ref 135–145)
Sodium: 137 mEq/L (ref 135–145)
TOTAL PROTEIN: 7.5 g/dL (ref 6.0–8.3)
Total Bilirubin: 0.6 mg/dL (ref 0.2–1.2)
Total Bilirubin: 0.8 mg/dL (ref 0.3–1.2)
Total Protein: 7.5 g/dL (ref 6.5–8.1)

## 2014-12-05 LAB — URINALYSIS, ROUTINE W REFLEX MICROSCOPIC
Bilirubin Urine: NEGATIVE
Glucose, UA: NEGATIVE mg/dL
Hgb urine dipstick: NEGATIVE
KETONES UR: NEGATIVE mg/dL
LEUKOCYTES UA: NEGATIVE
NITRITE: NEGATIVE
PROTEIN: 30 mg/dL — AB
Specific Gravity, Urine: 1.021 (ref 1.005–1.030)
UROBILINOGEN UA: 0.2 mg/dL (ref 0.0–1.0)
pH: 5 (ref 5.0–8.0)

## 2014-12-05 LAB — URINE MICROSCOPIC-ADD ON

## 2014-12-05 LAB — APTT: APTT: 28 s (ref 24–37)

## 2014-12-05 LAB — SEDIMENTATION RATE: SED RATE: 23 mm/h — AB (ref 0–22)

## 2014-12-05 LAB — I-STAT TROPONIN, ED: Troponin i, poc: 0 ng/mL (ref 0.00–0.08)

## 2014-12-05 LAB — C-REACTIVE PROTEIN: CRP: 0.8 mg/dL (ref 0.5–20.0)

## 2014-12-05 MED ORDER — TRAMADOL HCL 50 MG PO TABS
50.0000 mg | ORAL_TABLET | Freq: Once | ORAL | Status: AC
  Administered 2014-12-05: 50 mg via ORAL
  Filled 2014-12-05: qty 1

## 2014-12-05 MED ORDER — ONDANSETRON HCL 4 MG/2ML IJ SOLN
4.0000 mg | Freq: Once | INTRAMUSCULAR | Status: AC
  Administered 2014-12-05: 4 mg via INTRAVENOUS
  Filled 2014-12-05: qty 2

## 2014-12-05 MED ORDER — SODIUM CHLORIDE 0.9 % IV BOLUS (SEPSIS)
500.0000 mL | Freq: Once | INTRAVENOUS | Status: AC
  Administered 2014-12-05: 500 mL via INTRAVENOUS

## 2014-12-05 MED ORDER — MORPHINE SULFATE (PF) 4 MG/ML IV SOLN
4.0000 mg | Freq: Once | INTRAVENOUS | Status: AC
  Administered 2014-12-05: 4 mg via INTRAVENOUS
  Filled 2014-12-05: qty 1

## 2014-12-05 NOTE — ED Notes (Signed)
Pt taken to MRI  

## 2014-12-05 NOTE — ED Notes (Addendum)
Pt reports h/a since yesterday, this morning when he woke up at 0800 had h/a still and felt he couldn't get his words out. Pt went to PCP today and sent to eye doctor, who then sent here. Pt having trouble getting words out, feels like he knows what to say but can't get words out. Pt is alert, disoriented to month, day of week. Is following commands. No grift, equal grips. Had vomiting prior to coming.

## 2014-12-05 NOTE — Progress Notes (Signed)
Pre visit review using our clinic review tool, if applicable. No additional management support is needed unless otherwise documented below in the visit note.  Sugar has been ~200 recently, and that is an improvement.  Noted improvement with diet and med changes.    BP was elevated at home, some BP checks were still normal.  R sided HA for about 2 days.   No FCNAVD.   No ear pain.  H/o longstanding ear ringing B, no change.  Stuffy nose.  No ST.   No facial pain.   B HA along the scalp today.  No double vision but he has has some abnormal R lateral eye vision changes- "like a sense of motion" on the R side. It isn't like prev floaters.  This came up since last night, has been constant this AM.  No vision loss.  No L eye sx.    He had episodic chest tightness a few days ago, happened at rest.  None since.  No exertional sx.  Not SOB.  No BLE edema.  No CP/heaviness now.    Meds, vitals, and allergies reviewed.   ROS: See HPI.  Otherwise, noncontributory.  GEN: nad, alert and oriented HEENT: mucous membranes moist NECK: supple w/o LA CV: rrr.  PULM: ctab, no inc wob ABD: soft, +bs EXT: no edema SKIN: no acute rash.  CN 2-12 wnl B, S/S/DTR wnl x4 Fundus exam limited but wnl B  EKG wnl.  D/w pt at OV.

## 2014-12-05 NOTE — ED Notes (Signed)
Pt returned from MRI °

## 2014-12-05 NOTE — Discharge Instructions (Signed)
It was our pleasure to provide your ER care today - we hope that you feel better.  Rest. Drink adequate fluids.  Continue as aspirin a day.   For today's neurologic symptoms, follow up with neurologist in the next 1-2 weeks - see referral - call office to arrange appointment.  Also follow up with your primary care doctor in the coming week.   Return to ER right away if worse, new symptoms, fevers, worsening or severe headache, change in speech or vision, one-sided numbness or weakness, other concern.  You were given pain medication in the ER - no driving for the next 4 hours.        Aphasia Aphasia is a neurological disorder caused by damage to the parts of the brain that control language. CAUSES  Aphasia is not a disease, but a symptom of brain damage. Aphasia is commonly seen in adults who have suffered a stroke. Aphasia also can result from:  A brain tumor.  Infection.  Head injury.  A rare type of dementia called Primary Progressive Aphasia. Common types of dementia may be associated with aphasia but can also exist without language problems. SYMPTOMS  Primary signs of the disorder include:  Problems expressing oneself when speaking.  Trouble understanding speech.  Difficulty with reading and writing.  Speaking in short or incomplete sentences.  Speaking in sentences that don't make sense.  Speaking unrecognizable words.  Interpreting figurative language literally.  Writing sentences that don't make sense. The type and severity of language problems depend on the precise location and extent of the damaged brain tissue. Aphasia can be divided into four broad categories:  Expressive aphasia - difficulty in conveying thoughts through speech or writing. The patient knows what they want to say, but cannot find the words they need.  Receptive aphasia - difficulty understanding spoken or written language. The patient hears the voice or sees the print but cannot make  sense of the words.  Anomic or amnesia aphasia - difficulty in using the correct names for particular objects, people, places, or events. This is the least severe form of aphasia.  Global aphasia results from severe and extensive damage to the language areas of the brain. Patients lose almost all language function, both comprehension (understanding) and expression. They cannot speak, understand speech, read, or write. TREATMENT  Sometimes an individual will completely recover from aphasia without treatment. In most cases, language therapy should begin as soon as possible. Language therapy should be tailored to the individual needs of the patient. Therapy with a speech pathologist involves exercises in which patients:  Read.  Write.  Follow directions.  Repeat what they hear.  Computer-aided therapy may also be used. PROGNOSIS  The outcome of aphasia is difficult to predict. People who are younger or have less extensive brain damage do better. The location of the injury is also important. The location is a clue to prognosis. In general, patients tend to recover skills in language comprehension (understanding) more completely than those skills involving expression (speaking or writing). Document Released: 12/19/2001 Document Revised: 06/21/2011 Document Reviewed: 06/18/2013 [facility] Patient Information 2015 [place], [place]. This information is not intended to replace advice given to you by your health care provider. Make sure you discuss any questions you have with your health care provider.   Visual Disturbances You have had a disturbance in your vision. This may be caused by various conditions, such as:  Migraines. Migraine headaches are often preceded by a disturbance in vision. Blind spots or light flashes  are followed by a headache. This type of visual disturbance is temporary. It does not damage the eye.  Glaucoma. This is caused by increased pressure in the eye. Symptoms include  haziness, blurred vision, or seeing rainbow colored circles when looking at bright lights. Partial or complete visual loss can occur. You may or may not experience eye pain. Visual loss may be gradual or sudden and is irreversible. Glaucoma is the leading cause of blindness.  Retina problems. Vision will be reduced if the retina becomes detached or if there is a circulation problem as with diabetes, high blood pressure, or a mini-stroke. Symptoms include seeing "floaters," flashes of light, or shadows, as if a curtain has fallen over your eye.  Optic nerve problems. The main nerve in your eye can be damaged by redness, soreness, and swelling (inflammation), poor circulation, drugs, and toxins. It is very important to have a complete exam done by a specialist to determine the exact cause of your eye problem. The specialist may recommend medicines or surgery, depending on the cause of the problem. This can help prevent further loss of vision or reduce the risk of having a stroke. Contact the caregiver to whom you have been referred and arrange for follow-up care right away. SEEK IMMEDIATE MEDICAL CARE IF:   Your vision gets worse.  You develop severe headaches.  You have any weakness or numbness in the face, arms, or legs.  You have any trouble speaking or walking. Document Released: 05/06/2004 Document Revised: 06/21/2011 Document Reviewed: 08/27/2009 [name] Patient Information 2015 [place], [place]. This information is not intended to replace advice given to you by your health care provider. Make sure you discuss any questions you have with your health care provider.    General Headache Without Cause A headache is pain or discomfort felt around the head or neck area. The specific cause of a headache may not be found. There are many causes and types of headaches. A few common ones are:  Tension headaches.  Migraine headaches.  Cluster headaches.  Chronic daily headaches. HOME CARE  INSTRUCTIONS   Keep all follow-up appointments with your caregiver or any specialist referral.  Only take over-the-counter or prescription medicines for pain or discomfort as directed by your caregiver.  Lie down in a dark, quiet room when you have a headache.  Keep a headache journal to find out what may trigger your migraine headaches. For example, write down:  What you eat and drink.  How much sleep you get.  Any change to your diet or medicines.  Try massage or other relaxation techniques.  Put ice packs or heat on the head and neck. Use these 3 to 4 times per day for 15 to 20 minutes each time, or as needed.  Limit stress.  Sit up straight, and do not tense your muscles.  Quit smoking if you smoke.  Limit alcohol use.  Decrease the amount of caffeine you drink, or stop drinking caffeine.  Eat and sleep on a regular schedule.  Get 7 to 9 hours of sleep, or as recommended by your caregiver.  Keep lights dim if bright lights bother you and make your headaches worse. SEEK MEDICAL CARE IF:   You have problems with the medicines you were prescribed.  Your medicines are not working.  You have a change from the usual headache.  You have nausea or vomiting. SEEK IMMEDIATE MEDICAL CARE IF:   Your headache becomes severe.  You have a fever.  You have a stiff  neck.  You have loss of vision.  You have muscular weakness or loss of muscle control.  You start losing your balance or have trouble walking.  You feel faint or pass out.  You have severe symptoms that are different from your first symptoms. MAKE SURE YOU:   Understand these instructions.  Will watch your condition.  Will get help right away if you are not doing well or get worse. Document Released: 03/29/2005 Document Revised: 06/21/2011 Document Reviewed: 04/14/2011 [facility] Patient Information 2015 [place], [place]. This information is not intended to replace advice given to you by your health  care provider. Make sure you discuss any questions you have with your health care provider.

## 2014-12-05 NOTE — ED Provider Notes (Addendum)
CSN: [id number]     Arrival date & time 12/05/14  1639 History   First MD Initiated Contact with Patient 12/05/14 1701     Chief Complaint  Patient presents with  . Altered Mental Status     (Consider location/radiation/quality/duration/timing/severity/associated sxs/prior Treatment) Patient is a 69 y.o. male presenting with altered mental status. The history is provided by the patient and a relative.  Altered Mental Status Presenting symptoms: no confusion   Associated symptoms: headaches   Associated symptoms: no abdominal pain, no fever, no rash and no vomiting   Patient w hx htn, dm, presents c/o trouble finding words, slow speech, right lateral visual field blurriness/movement, all for the past day. Family member and nurse note answers some questions inappropriately, is oriented to person/place, but not day or week or year. Symptoms have been persistent/constant since yesterday. Also notes dull/diffuse headache since yesterday. Was mild at onset, and gradual in onset, slowly worse, no abrupt change today. Pt denies unilateral numbness or weakness. Denies problems w balance or normal functional ability. Pt drove self to pcp this AM, and then was referred to ophthalmologist, who sent to ED ?for cva workup.  Pt denies any recent change in meds, compliant w normal meds. No chest pain or discomfort. No syncope.     Past Medical History  Diagnosis Date  . Depression   . Diabetes mellitus   . Hyperlipidemia   . Hypertension   . Osteoarthritis   . Allergic rhinitis due to pollen   . GERD (gastroesophageal reflux disease)   . Gout   . Diverticulosis   . Personal history of colonic polyps     1996  . Low testosterone   . Iron deficiency   . Hypogonadism male 06/14/2012  . Chronic kidney disease, stage III (moderate) 12/19/2012  . Former very heavy cigarette smoker (more than 40 per day) 10/07/2014   Past Surgical History  Procedure Laterality Date  . Cholecystectomy  1980  . Total hip  arthroplasty  2009    Dr. Gladstone Lighter  . Total hip arthroplasty    . Cardiac catheterization  11/2011    [facility]   Family History  Problem Relation Age of Onset  . Diabetes Mother   . Diabetes Father   . Lung cancer Sister     lung  . Heart disease Maternal Grandfather   . COPD Sister   . Heart attack Sister   . Heart disease Sister    Social History  Substance Use Topics  . Smoking status: Former Smoker -- 3.00 packs/day for 35 years    Types: Cigarettes    Quit date: 04/12/2000  . Smokeless tobacco: Never Used  . Alcohol Use: No    Review of Systems  Constitutional: Negative for fever and chills.  HENT: Negative for sore throat.   Eyes: Positive for visual disturbance. Negative for pain.  Respiratory: Negative for cough and shortness of breath.   Cardiovascular: Negative for chest pain.  Gastrointestinal: Negative for vomiting, abdominal pain and diarrhea.  Endocrine: Negative for polyuria.  Genitourinary: Negative for dysuria and flank pain.  Musculoskeletal: Negative for back pain, neck pain and neck stiffness.  Skin: Negative for rash.  Neurological: Positive for speech difficulty and headaches. Negative for syncope and numbness.  Hematological: Does not bruise/bleed easily.  Psychiatric/Behavioral: Negative for confusion.      Allergies  Tetracycline  Home Medications   Prior to Admission medications   Medication Sig Start Date End Date Taking? Authorizing Provider  acetaminophen (TYLENOL) 500  MG tablet Take 1,000 mg by mouth every 6 (six) hours as needed for moderate pain.    Historical Provider, MD  aspirin 81 MG chewable tablet Chew 81 mg by mouth at bedtime.     Historical Provider, MD  atenolol (TENORMIN) 100 MG tablet TAKE ONE TABLET BY MOUTH ONCE DAILY 07/07/14   [name], MD  b complex vitamins tablet Take 1 tablet by mouth daily.      Historical Provider, MD  cholecalciferol (VITAMIN D) 1000 UNITS tablet Take 1,000 Units by mouth daily.       Historical Provider, MD  doxazosin (CARDURA) 8 MG tablet Take 1 tablet (8 mg total) by mouth at bedtime. 09/10/14   Ria Bush, MD  famotidine (PEPCID) 10 MG tablet Take 10 mg by mouth daily.      Historical Provider, MD  fluticasone (FLONASE) 50 MCG/ACT nasal spray INHALE 2 SPRAYS INTO EACH NOSTRIL EVERY DAY. AS NEEDED 02/19/14   [name], MD  insulin NPH Human (HUMULIN N,NOVOLIN N) 100 UNIT/ML injection Inject 0.5 mLs (50 Units total) into the skin 3 (three) times daily. 10/24/14   Philemon Kingdom, MD  insulin regular (NOVOLIN R,HUMULIN R) 100 units/mL injection Inject up to 130 units a day as advised 10/24/14   Philemon Kingdom, MD  lisinopril-hydrochlorothiazide (PRINZIDE,ZESTORETIC) 20-12.5 MG per tablet TAKE ONE TABLET BY MOUTH ONCE DAILY 07/07/14   [name], MD  nystatin cream (MYCOSTATIN) Apply topically 2 (two) times daily as needed. 11/13/13   Tonia Ghent, MD  simvastatin (ZOCOR) 40 MG tablet TAKE ONE TABLET BY MOUTH EVERY NIGHT AT BEDTIME 03/19/14   [name], MD  tamsulosin (FLOMAX) 0.4 MG CAPS capsule Take 1 capsule (0.4 mg total) by mouth daily. 10/07/14   [name], MD  testosterone cypionate (DEPOTESTOTERONE CYPIONATE) 100 MG/ML injection Inject 1 mL (100 mg total) into the muscle every 14 (fourteen) days. For IM use only 10/30/14   [name], MD  venlafaxine XR (EFFEXOR-XR) 75 MG 24 hr capsule TAKE THREE (3) CAPSULES BY MOUTH DAILY 03/19/14   Spencer Copland, MD   BP 136/63 mmHg  Pulse 78  Temp(Src) 97.6 F (36.4 C) (Oral)  Resp 16  SpO2 93% Physical Exam  Constitutional: He is oriented to person, place, and time. He appears well-developed and well-nourished. No distress.  HENT:  Head: Atraumatic.  Mouth/Throat: Oropharynx is clear and moist.  No sinus or temporal tenderness.   Eyes: Conjunctivae and EOM are normal. Pupils are equal, round, and reactive to light. No scleral icterus.  Neck: Neck supple. No tracheal deviation present. No  thyromegaly present.  No bruits  Cardiovascular: Normal rate, regular rhythm, normal heart sounds and intact distal pulses.   Pulmonary/Chest: Effort normal and breath sounds normal. No accessory muscle usage. No respiratory distress.  Abdominal: Soft. Bowel sounds are normal. He exhibits no distension. There is no tenderness.  Genitourinary:  No cva tenderness  Musculoskeletal: Normal range of motion. He exhibits no edema or tenderness.  Neurological: He is alert and oriented to person, place, and time. No cranial nerve deficit.  Speech currently clear/fluent. Motor intact bil, stre 5/5. No pronator drift. sens grossly intact.   Skin: Skin is warm and dry. No rash noted.  Psychiatric: He has a normal mood and affect.  Nursing note and vitals reviewed.   ED Course  Procedures (including critical care time) Labs Review   Results for orders placed or performed during the hospital encounter of 12/05/14  Protime-INR  Result Value Ref Range  Prothrombin Time 13.6 11.6 - 15.2 seconds   INR 1.02 0.00 - 1.49  APTT  Result Value Ref Range   aPTT 28 24 - 37 seconds  CBC  Result Value Ref Range   WBC 11.5 (H) 4.0 - 10.5 K/uL   RBC 4.65 4.22 - 5.81 MIL/uL   Hemoglobin 14.4 13.0 - 17.0 g/dL   HCT 41.1 39.0 - 52.0 %   MCV 88.4 78.0 - 100.0 fL   MCH 31.0 26.0 - 34.0 pg   MCHC 35.0 30.0 - 36.0 g/dL   RDW 12.7 11.5 - 15.5 %   Platelets 164 150 - 400 K/uL  Differential  Result Value Ref Range   Neutrophils Relative % 81 (H) 43 - 77 %   Neutro Abs 9.3 (H) 1.7 - 7.7 K/uL   Lymphocytes Relative 10 (L) 12 - 46 %   Lymphs Abs 1.2 0.7 - 4.0 K/uL   Monocytes Relative 9 3 - 12 %   Monocytes Absolute 1.0 0.1 - 1.0 K/uL   Eosinophils Relative 0 0 - 5 %   Eosinophils Absolute 0.0 0.0 - 0.7 K/uL   Basophils Relative 0 0 - 1 %   Basophils Absolute 0.0 0.0 - 0.1 K/uL  Comprehensive metabolic panel  Result Value Ref Range   Sodium 135 135 - 145 mmol/L   Potassium 4.2 3.5 - 5.1 mmol/L    Chloride 97 (L) 101 - 111 mmol/L   CO2 29 22 - 32 mmol/L   Glucose, Bld 105 (H) 65 - 99 mg/dL   BUN 15 6 - 20 mg/dL   Creatinine, Ser 1.37 (H) 0.61 - 1.24 mg/dL   Calcium 9.4 8.9 - 10.3 mg/dL   Total Protein 7.5 6.5 - 8.1 g/dL   Albumin 4.1 3.5 - 5.0 g/dL   AST 30 15 - 41 U/L   ALT 26 17 - 63 U/L   Alkaline Phosphatase 70 38 - 126 U/L   Total Bilirubin 0.8 0.3 - 1.2 mg/dL   GFR calc non Af Amer 51 (L) >60 mL/min   GFR calc Af Amer 59 (L) >60 mL/min   Anion gap 9 5 - 15  I-stat troponin, ED (not at [facility], [facility])  Result Value Ref Range   Troponin i, poc 0.00 0.00 - 0.08 ng/mL   Comment 3          I-Stat Chem 8, ED  (not at [facility], [facility])  Result Value Ref Range   Sodium 136 135 - 145 mmol/L   Potassium 4.2 3.5 - 5.1 mmol/L   Chloride 97 (L) 101 - 111 mmol/L   BUN 22 (H) 6 - 20 mg/dL   Creatinine, Ser 1.30 (H) 0.61 - 1.24 mg/dL   Glucose, Bld 105 (H) 65 - 99 mg/dL   Calcium, Ion 1.15 1.13 - 1.30 mmol/L   TCO2 27 0 - 100 mmol/L   Hemoglobin 15.0 13.0 - 17.0 g/dL   HCT 44.0 39.0 - 52.0 %   Ct Head Wo Contrast  12/05/2014   CLINICAL DATA:  Headaches since yesterday.  EXAM: CT HEAD WITHOUT CONTRAST  TECHNIQUE: Contiguous axial images were obtained from the base of the skull through the vertex without intravenous contrast.  COMPARISON:  November 04, 2012  FINDINGS: There is no midline shift, hydrocephalus, or mass. No acute hemorrhage or acute transcortical infarct is identified. There is chronic diffuse atrophy. The bony calvarium is intact. The visualized sinuses are clear.  IMPRESSION: No focal acute intracranial abnormality identified.  Chronic diffuse atrophy.  Electronically Signed   By: Abelardo Diesel M.D.   On: 12/05/2014 18:08   Mr Brain Wo Contrast  12/05/2014   CLINICAL DATA:  69 year old male with headache since yesterday and expressive aphasia. Visual changes. Initial encounter.  EXAM: MRI HEAD WITHOUT CONTRAST  TECHNIQUE: Multiplanar, multiecho pulse sequences of the brain and  surrounding structures were obtained without intravenous contrast.  COMPARISON:  Head CT without contrast 1804 hr today, and earlier  FINDINGS: Study is intermittently, mildly degraded by motion artifact despite repeated imaging attempts.  Cerebral volume is within normal limits for age. No restricted diffusion to suggest acute infarction. No midline shift, mass effect, evidence of mass lesion, ventriculomegaly, extra-axial collection or acute intracranial hemorrhage. Cervicomedullary junction and pituitary are within normal limits. Major intracranial vascular flow voids are within normal limits.  Minimal to mild for age scattered small foci of cerebral white matter T2 and FLAIR hyperintensity, mostly in the left hemisphere. No cortical encephalomalacia or definite chronic cerebral blood products. Deep gray matter nuclei, brainstem, and cerebellum are within normal limits.  Visible internal auditory structures appear normal. Mastoids and paranasal sinuses are clear. Orbit and scalp soft tissues appear normal. Normal bone marrow signal. Negative visualized cervical spine.  IMPRESSION: 1.  No acute intracranial abnormality. 2. Mild for age nonspecific cerebral white matter signal changes, most commonly due to chronic small vessel disease.   Electronically Signed   By: Genevie Ann M.D.   On: 12/05/2014 20:13       I have personally reviewed and evaluated these images and lab results as part of my medical decision-making.   EKG Interpretation   Date/Time:  Thursday December 05 2014 16:48:59 EDT Ventricular Rate:  80 PR Interval:  194 QRS Duration: 70 QT Interval:  364 QTC Calculation: 419 R Axis:   73 Text Interpretation:  Normal sinus rhythm No significant change since last  tracing Confirmed by Raidon Swanner  MD, Lennette Bihari ([id number]) on 12/05/2014 5:08:56 PM      MDM   Iv ns. Labs. Ct.  Reviewed nursing notes and prior charts for additional history.   Pt requests pain med. Morphine iv. zofran iv.   Ct neg  acute, mri ordered.  Recheck pt, states is having less problems finding words, states his speech seems improved.  Still notes sense 'movement' right lateral visual field, and remains disoriented to year/day/date.   Will consult neurology.    Discussed with neurology who evaluated in ED, Dr Janann Colonel indicates after his evaluation/re-evaluation of patient, that symptoms markedly improved, and recommends no further imaging or evaluation at this point, rec pcp/neurology follow up as outpt.   Recheck, no headache, no persistent neurologic symptoms.       Lajean Saver, MD 12/05/14 [phone]

## 2014-12-05 NOTE — Patient Instructions (Signed)
Go to the lab on the way out.  We'll contact you with your lab report. Go straight to the eye clinic.  I talked with Dr. Manuella Ghazi.   If you have chest pain, go to the ER.  Your BP and EKG were fine.   We'll be in touch.  Update me tomorrow.

## 2014-12-05 NOTE — ED Notes (Signed)
Darl Householder [place] [phone]

## 2014-12-05 NOTE — ED Notes (Signed)
Pt a&ox4; swallow screen repeated per MD; pt able to swallow without difficulty.

## 2014-12-05 NOTE — ED Notes (Signed)
Neurologist at bedside. 

## 2014-12-05 NOTE — ED Notes (Signed)
Dr. Steinl at bedside 

## 2014-12-05 NOTE — Telephone Encounter (Signed)
[facility] called to let me know that they didn't feel comfortable running the Troponin because it felt room temp when they received it (it was frozen when it left here). I was going to call the patient to have him come in to let me redraw it and send it to [facility], but when I looked in his chart, he was in the ER. Just wanted to let you know.

## 2014-12-05 NOTE — Consult Note (Signed)
Consult Reason for Consult:headache and confusion Referring Physician:   CC: headache, speech difficulties  HPI: [name] is an 69 y.o. male hx of HTN, HLD, DM presenting with a one day history of headache and confusion. Headache described as generalized dull aching that has gotten progressively worse. No associated nausea, vomiting, no photo or phonophobia. No nuchal rigidity. No recent fevers or illness. Notes some blurred vision that has resolved. Denies any visual field cuts. He notes some confusion, trouble getting words out. He does note he has not hydrated well in the past 24hrs. MRI brain completed while symptomatic, imaging reviewed and overall unremarkable.   Was given pain medication in the ED which resolved the headache and the rest of his symptoms. Currently asymptomatic.   Past Medical History  Diagnosis Date  . Depression   . Diabetes mellitus   . Hyperlipidemia   . Hypertension   . Osteoarthritis   . Allergic rhinitis due to pollen   . GERD (gastroesophageal reflux disease)   . Gout   . Diverticulosis   . Personal history of colonic polyps     1996  . Low testosterone   . Iron deficiency   . Hypogonadism male 06/14/2012  . Chronic kidney disease, stage III (moderate) 12/19/2012  . Former very heavy cigarette smoker (more than 40 per day) 10/07/2014    Past Surgical History  Procedure Laterality Date  . Cholecystectomy  1980  . Total hip arthroplasty  2009    Dr. Gladstone Lighter  . Total hip arthroplasty    . Cardiac catheterization  11/2011    [facility]    Family History  Problem Relation Age of Onset  . Diabetes Mother   . Diabetes Father   . Lung cancer Sister     lung  . Heart disease Maternal Grandfather   . COPD Sister   . Heart attack Sister   . Heart disease Sister     Social History:  reports that he quit smoking about 14 years ago. His smoking use included Cigarettes. He has a 105 pack-year smoking history. He has never used smokeless tobacco. He  reports that he does not drink alcohol or use illicit drugs.  Allergies  Allergen Reactions  . Tetracycline Itching and Rash    Medications: I have reviewed the patient's current medications.  ROS: Out of a complete 14 system review, the patient complains of only the following symptoms, and all other reviewed systems are negative. +headache  Physical Examination: Filed Vitals:   12/05/14 2145  BP: 149/74  Pulse: 80  Temp:   Resp: 15   Physical Exam  Constitutional: He appears well-developed and well-nourished.  Psych: Affect appropriate to situation Eyes: No scleral injection HENT: No OP obstrucion Head: Normocephalic.  Cardiovascular: Normal rate and regular rhythm.  Respiratory: Effort normal and breath sounds normal.  GI: Soft. Bowel sounds are normal. No distension. There is no tenderness.  Skin: WDI  Neurologic Examination Mental Status: Alert, oriented, thought content appropriate.  Speech fluent without evidence of aphasia.  Able to follow 3 step commands without difficulty. Cranial Nerves: II: funduscopic exam wnl bilaterally, visual fields grossly normal, pupils equal, round, reactive to light and accommodation III,IV, VI: ptosis not present, extra-ocular motions intact bilaterally V,VII: smile symmetric, facial light touch sensation normal bilaterally VIII: hearing normal bilaterally IX,X: gag reflex present XI: trapezius strength/neck flexion strength normal bilaterally XII: tongue strength normal  Motor: Right : Upper extremity    Left:     Upper extremity 5/5  deltoid       5/5 deltoid 5/5 biceps      5/5 biceps  5/5 triceps      5/5 triceps 5/5 hand grip      5/5 hand grip  Lower extremity     Lower extremity 5/5 hip flexor      5/5 hip flexor 5/5 quadricep      5/5 quadriceps  5/5 hamstrings     5/5 hamstrings 5/5 plantar flexion       5/5 plantar flexion 5/5 plantar extension     5/5 plantar extension Tone and bulk:normal tone throughout; no  atrophy noted Sensory: Pinprick and light touch intact throughout, bilaterally Deep Tendon Reflexes: 2+ and symmetric throughout Plantars: Right: downgoing   Left: downgoing Cerebellar: normal finger-to-nose, normal rapid alternating movements and normal heel-to-shin test Gait: normal gait and station  Laboratory Studies:   Basic Metabolic Panel:  Recent Labs Lab 12/05/14 1322 12/05/14 1710 12/05/14 1749  NA 137 135 136  K 4.2 4.2 4.2  CL 99 97* 97*  CO2 31 29  --   GLUCOSE 85 105* 105*  BUN 18 15 22*  CREATININE 1.31 1.37* 1.30*  CALCIUM 9.7 9.4  --     Liver Function Tests:  Recent Labs Lab 12/05/14 1322 12/05/14 1710  AST 22 30  ALT 23 26  ALKPHOS 67 70  BILITOT 0.6 0.8  PROT 7.5 7.5  ALBUMIN 4.2 4.1   No results for input(s): LIPASE, AMYLASE in the last 168 hours. No results for input(s): AMMONIA in the last 168 hours.  CBC:  Recent Labs Lab 12/05/14 1322 12/05/14 1710 12/05/14 1749  WBC 8.2 11.5*  --   NEUTROABS 5.5 9.3*  --   HGB 14.4 14.4 15.0  HCT 42.3 41.1 44.0  MCV 89.8 88.4  --   PLT 187.0 164  --     Cardiac Enzymes: No results for input(s): CKTOTAL, CKMB, CKMBINDEX, TROPONINI in the last 168 hours.  BNP: Invalid input(s): POCBNP  CBG: No results for input(s): GLUCAP in the last 168 hours.  Microbiology: Results for orders placed or performed in visit on 04/10/14  Urine culture     Status: None   Collection Time: 04/10/14  2:39 PM  Result Value Ref Range Status   Colony Count NO GROWTH  Final   Organism ID, Bacteria NO GROWTH  Final    Coagulation Studies:  Recent Labs  12/05/14 1710  LABPROT 13.6  INR 1.02    Urinalysis:  Recent Labs Lab 12/05/14 2146  COLORURINE AMBER*  LABSPEC 1.021  PHURINE 5.0  GLUCOSEU NEGATIVE  HGBUR NEGATIVE  BILIRUBINUR NEGATIVE  KETONESUR NEGATIVE  PROTEINUR 30*  UROBILINOGEN 0.2  NITRITE NEGATIVE  LEUKOCYTESUR NEGATIVE    Lipid Panel:     Component Value Date/Time   CHOL  159 12/12/2013 0902   TRIG 635.0* 12/12/2013 0902   HDL 29.30* 12/12/2013 0902   CHOLHDL 5 12/12/2013 0902   VLDL 127.0* 12/12/2013 0902   LDLCALC 43 04/24/2009    HgbA1C:  Lab Results  Component Value Date   HGBA1C 12.2* 09/10/2014    Urine Drug Screen:  No results found for: LABOPIA, COCAINSCRNUR, LABBENZ, AMPHETMU, THCU, LABBARB  Alcohol Level: No results for input(s): ETH in the last 168 hours.  Other results:  Imaging: Ct Head Wo Contrast  12/05/2014   CLINICAL DATA:  Headaches since yesterday.  EXAM: CT HEAD WITHOUT CONTRAST  TECHNIQUE: Contiguous axial images were obtained from the base of the skull through the  vertex without intravenous contrast.  COMPARISON:  November 04, 2012  FINDINGS: There is no midline shift, hydrocephalus, or mass. No acute hemorrhage or acute transcortical infarct is identified. There is chronic diffuse atrophy. The bony calvarium is intact. The visualized sinuses are clear.  IMPRESSION: No focal acute intracranial abnormality identified.  Chronic diffuse atrophy.   Electronically Signed   By: Abelardo Diesel M.D.   On: 12/05/2014 18:08   Mr Brain Wo Contrast  12/05/2014   CLINICAL DATA:  69 year old male with headache since yesterday and expressive aphasia. Visual changes. Initial encounter.  EXAM: MRI HEAD WITHOUT CONTRAST  TECHNIQUE: Multiplanar, multiecho pulse sequences of the brain and surrounding structures were obtained without intravenous contrast.  COMPARISON:  Head CT without contrast 1804 hr today, and earlier  FINDINGS: Study is intermittently, mildly degraded by motion artifact despite repeated imaging attempts.  Cerebral volume is within normal limits for age. No restricted diffusion to suggest acute infarction. No midline shift, mass effect, evidence of mass lesion, ventriculomegaly, extra-axial collection or acute intracranial hemorrhage. Cervicomedullary junction and pituitary are within normal limits. Major intracranial vascular flow voids are  within normal limits.  Minimal to mild for age scattered small foci of cerebral white matter T2 and FLAIR hyperintensity, mostly in the left hemisphere. No cortical encephalomalacia or definite chronic cerebral blood products. Deep gray matter nuclei, brainstem, and cerebellum are within normal limits.  Visible internal auditory structures appear normal. Mastoids and paranasal sinuses are clear. Orbit and scalp soft tissues appear normal. Normal bone marrow signal. Negative visualized cervical spine.  IMPRESSION: 1.  No acute intracranial abnormality. 2. Mild for age nonspecific cerebral white matter signal changes, most commonly due to chronic small vessel disease.   Electronically Signed   By: Genevie Ann M.D.   On: 12/05/2014 20:13     Assessment/Plan:  69y/o gentleman hx of HTN, HLD presenting with 24hrs of headache with associated confusion. Symptoms resolved after receiving pain medication and currently asymptomatic with normal neurological exam. MRI brain imaging reviewed and overall unremarkable. Unclear etiology of symptoms. Patient notes poor hydration which could be contributing to his symptoms. Migraine headache is in the differential. Cannot rule out TIA though less likely based on resolution of symptoms with pain medication.  -no further inpatient neurological workup at this time. -continue ASA '81mg'$  daily -follow up with primary care physician -patient counseled to return to ED if symptoms return  Dustin Like, DO [facility] [phone]  If [place] [place], please page neurology on call as listed in [place]. 12/05/2014, 11:03 PM

## 2014-12-05 NOTE — ED Notes (Signed)
Patient transported to CT 

## 2014-12-06 ENCOUNTER — Encounter: Payer: Self-pay | Admitting: Internal Medicine

## 2014-12-06 DIAGNOSIS — H539 Unspecified visual disturbance: Secondary | ICD-10-CM | POA: Insufficient documentation

## 2014-12-06 NOTE — Assessment & Plan Note (Signed)
R lateral vision change, not loss.  Doesn't seem like prev episodes of floaters.  No double vision.  With mult cardiac risk factors and prev episode of chest tightness but no chest sx now, EKG wnl. No exertional sx.   I called Dr. Manuella Ghazi at [facility]- his prev eye MD there had retired.  D/w him about the case.  Labs pending.  Unlikely to be temporal arteritis and I doubt central CVA.  He could have retinal changes that I can't detect.   Will have patient go straight to eye clinic for exam.  If CP, to ER.  Still okay for outpatient f/u.   >45 minutes spent in face to face time with patient, >50% spent in counselling or coordination of care.  App help of all involved.   Routed to PCP as FYI.

## 2014-12-06 NOTE — Telephone Encounter (Signed)
Noted  

## 2014-12-10 ENCOUNTER — Encounter: Payer: Self-pay | Admitting: Internal Medicine

## 2014-12-10 ENCOUNTER — Ambulatory Visit (INDEPENDENT_AMBULATORY_CARE_PROVIDER_SITE_OTHER): Payer: Medicare Other | Admitting: Internal Medicine

## 2014-12-10 ENCOUNTER — Other Ambulatory Visit (INDEPENDENT_AMBULATORY_CARE_PROVIDER_SITE_OTHER): Payer: Medicare Other | Admitting: *Deleted

## 2014-12-10 VITALS — BP 144/86 | HR 117 | Temp 97.9°F | Resp 16 | Wt 257.4 lb

## 2014-12-10 DIAGNOSIS — E1065 Type 1 diabetes mellitus with hyperglycemia: Principal | ICD-10-CM

## 2014-12-10 DIAGNOSIS — E1049 Type 1 diabetes mellitus with other diabetic neurological complication: Secondary | ICD-10-CM

## 2014-12-10 DIAGNOSIS — E1041 Type 1 diabetes mellitus with diabetic mononeuropathy: Secondary | ICD-10-CM

## 2014-12-10 DIAGNOSIS — IMO0002 Reserved for concepts with insufficient information to code with codable children: Secondary | ICD-10-CM

## 2014-12-10 LAB — POCT GLYCOSYLATED HEMOGLOBIN (HGB A1C): Hemoglobin A1C: 9.4

## 2014-12-10 MED ORDER — INSULIN REGULAR HUMAN 100 UNIT/ML IJ SOLN: INTRAMUSCULAR | Status: DC

## 2014-12-10 NOTE — Patient Instructions (Signed)
Please continue the insulin doses as follows:  Type of insulin Breakfast Lunch Dinner  Humulin R 50 30-50 50  Humulin N 50  50   Please inject 30 min before every main meal. Do not skip doses. Check sugars at least 3x a day before meals and sometimes at bedtime.  Please return in 3 months with your sugar log.

## 2014-12-10 NOTE — Progress Notes (Signed)
Patient ID: [name], male   DOB: 06-23-1945, 69 y.o.   [name]: [id number]  HPI: [name] is a 69 y.o.-year-old male, returning for f/u for DM2, dx 1997, insulin-dependent since 2010, uncontrolled, with complications (CKD stage 3, PN). Last visit was 1.5 mo ago!   He was in the ED 12/05/2014 for HA, difficulty with speech >> MRI, CT normal. He still has CP.   Last hemoglobin A1c was: Lab Results  Component Value Date   HGBA1C 12.2* 09/10/2014   HGBA1C 12.0* 12/12/2013   HGBA1C 12.8* 06/06/2013   He is on: Type of insulin Breakfast Lunch Dinner  Humulin R 50 30-50 50  Humulin N 50  50  He does not have problems mixing the 2 insulins. They are cheap: $24 per bottle from [place], while they were $200 at [facility]! Gets insulin w/o Rx's.  Pt checks his sugars once a day in am (forgot log) and they are: - am: 150-170 >> 250 >> 130-140 >> 300-400s >> 168, 250-350 >> 86, 136-271, 297 - 2h after b'fast: n/c  - before lunch: n/c >> 80, 114-289 - 2h after lunch: n/c >> 300s >> n/c - before dinner: n/c >> 217-237 - 2h after dinner: n/c - bedtime: n/c >> 197-387 - nighttime: 60s, wakes him up >> n/c Had one low last week >> felt it, did not check; he has hypoglycemia awareness at 100.  Highest sugar was 451  Pt's meals are (he reduced bread, uses whole wheat now): - Breakfast: 2 slices buttered toast - Lunch: ham sandwich + chips - Dinner: meat + veggies + starch - Snacks: 4-5 x a day  - has CKD, last BUN/creatinine:  Lab Results  Component Value Date   BUN 22* 12/05/2014   CREATININE 1.30* 12/05/2014  Last ACR 4 on 12/12/2012. On Lisinopril 20.  - last set of lipids: Lab Results  Component Value Date   CHOL 159 12/12/2013   HDL 29.30* 12/12/2013   LDLCALC 43 04/24/2009   LDLDIRECT 64.1 12/12/2013   TRIG 635.0* 12/12/2013   CHOLHDL 5 12/12/2013  On Zocor 40. - last eye exam was in 10/01/2014. No DR.  -+ numbness and tingling in his feet, especially at night.  I  reviewed pt's medications, allergies, PMH, social hx, family hx, and changes were documented in the history of present illness. Otherwise, unchanged from my initial visit note.  ROS: Constitutional: + weight gain, no fatigue, no subjective hyperthermia/hypothermia, + nocturia x1-2 Eyes: no blurry vision, no xerophthalmia ENT: no sore throat, no nodules palpated in throat, no dysphagia/odynophagia, + tinnitus Cardiovascular: + CP/SOB/no palpitations/leg swelling Respiratory: + cough/no SOB Gastrointestinal: no N/V/D/C/heartburn Musculoskeletal: no muscle/no joint aches Skin: no rashes Neurological: no tremors/numbness/tingling/dizziness, + HA + diff with erections, + low libido  PE: BP 144/86 mmHg  Pulse 117  Temp(Src) 97.9 F (36.6 C) (Oral)  Resp 16  Wt 257 lb 6.4 oz (116.756 kg)  SpO2 94% Body mass index is 34.9 kg/(m^2). Wt Readings from Last 3 Encounters:  12/10/14 257 lb 6.4 oz (116.756 kg)  12/05/14 254 lb (115.214 kg)  10/24/14 246 lb (111.585 kg)   Constitutional: overweight, in NAD Eyes: PERRLA, EOMI, no exophthalmos ENT: moist mucous membranes, no thyromegaly, no cervical lymphadenopathy, + tinnitus Cardiovascular: tachycardia, RR, No MRG, + visible varicose veins L leg Respiratory: CTA B Gastrointestinal: abdomen soft, NT, ND, BS+ Musculoskeletal: no deformities, strength intact in all 4 Skin: moist, warm, no rashes  ASSESSMENT: 1. DM2, insulin-dependent, uncontrolled, with complications -  noncompliance with visits, insulin doses, CBG checks - CKD stage 3 - PN  PLAN:  1. Patient with long-standing, uncontrolled diabetes, now with slightly better control. He is now on a basal-bolus NPH and regular insulin regimen. Checking sugars more consistently. I again suggested to start U500 insulin as he is using now 200 units insulin a day >> refuses as too expensive.  - I suggested to continue same regimen:  Patient Instructions   Please change the insulin doses as  follows:  Type of insulin Breakfast Lunch Dinner  Humulin R 50 30-50 50  Humulin N 50  50   Please inject 30 min before every main meal. Do not skip doses. Check sugars at least 3x a day before meals and sometimes at bedtime. Continue to work on your diet! Please return in 1 month with your sugar log.   - continue checking sugars at different times of the day - check 3 times a day, rotating checks - checked HbA1c >> 9.4% - UTD with eye exams - RTC in 3 mo

## 2014-12-11 ENCOUNTER — Ambulatory Visit (INDEPENDENT_AMBULATORY_CARE_PROVIDER_SITE_OTHER): Payer: Medicare Other | Admitting: Family Medicine

## 2014-12-11 ENCOUNTER — Encounter: Payer: Self-pay | Admitting: Family Medicine

## 2014-12-11 VITALS — BP 132/70 | HR 80 | Temp 97.6°F | Ht 72.0 in | Wt 256.5 lb

## 2014-12-11 DIAGNOSIS — R0789 Other chest pain: Secondary | ICD-10-CM | POA: Diagnosis not present

## 2014-12-11 DIAGNOSIS — G459 Transient cerebral ischemic attack, unspecified: Secondary | ICD-10-CM

## 2014-12-11 DIAGNOSIS — H539 Unspecified visual disturbance: Secondary | ICD-10-CM

## 2014-12-11 DIAGNOSIS — E291 Testicular hypofunction: Secondary | ICD-10-CM

## 2014-12-11 MED ORDER — TESTOSTERONE CYPIONATE 100 MG/ML IM SOLN
100.0000 mg | Freq: Once | INTRAMUSCULAR | Status: AC
  Administered 2014-12-11: 100 mg via INTRAMUSCULAR

## 2014-12-11 NOTE — Patient Instructions (Signed)
REFERRALS TO SPECIALISTS, SPECIAL TESTS (MRI, CT, ULTRASOUNDS)  [facility] or [facility] will help you. ASK CHECK-IN FOR HELP.  Imaging / Special Testing referrals sometimes can be done same day if EMERGENCY, but others can take 2 or 3 days to get an appointment. Starting in 2015, many of the new Medicare plans and Obamacare plans take much longer.   Specialist appointment times vary a great deal, based on their schedule / openings. -- Some specialists have very long wait times. (Example. Dermatology. Multiple months  for non-cancer)   

## 2014-12-11 NOTE — Progress Notes (Signed)
 Dr.  T. , MD, [facility] [address] [place], [address] Phone: [phone] Fax: [phone]  12/11/2014  Patient: [name], [name]: [id number], DOB: 08/09/1945, 69 y.o.  Primary Physician:   , MD  Chief Complaint: Hospitalization Follow-up  Subjective:   [name] is a 69 y.o. very pleasant male patient who presents with the following:  ER f/u 12/05/2014  Gibberish with trying to talk and right side of his head. At that point, he was also having some blurred vision and difficulty seeing, so he went to the emergency room for urgent evaluation, worried about a potential stroke.  While in the emergency room, they did extensive laboratory testing, he also had some atypical chest pain with negative troponins.  He is had a recent cardiac catheterization within the last few years which was negative for any significant coronary disease, and he also had a CT of his head and an MRI of his head which were grossly unremarkable.  His symptoms resolved over time.  Neurology saw him in the emergency room as well, and they felt like he had no acute stroke, but potentially had a TIA.  At this point he feels mostly all better, but he still does have some pressure sensation on the right side of his head. Right side of head feels weird with pressure.   He is currently on aspirin, lipid medication, good blood pressure control.  His diabetes is not well controlled and he is significantly obese. Body mass index is 34.78 kg/(m^2).   Past Medical History, Surgical History, Social History, Family History, Problem List, Medications, and Allergies have been reviewed and updated if relevant.  Patient Active Problem List   Diagnosis Date Noted  . Type 1 diabetes mellitus with neurological manifestations, uncontrolled 08/05/2011    Priority: High  . Former very heavy cigarette smoker (more than 40 per day) 10/07/2014   Priority: Medium  . Chronic kidney disease, stage III (moderate) 12/19/2012    Priority: Medium  . OSA (obstructive sleep apnea) 03/09/2011    Priority: Medium  . Diabetic neuropathy 12/29/2010    Priority: Medium  . HYPERLIPIDEMIA 06/13/2008    Priority: Medium  . Major depressive disorder, recurrent episode, in partial remission 06/13/2008    Priority: Medium  . HYPERTENSION 06/13/2008    Priority: Medium  . Vision changes 12/06/2014  . Hypogonadism male 06/14/2012  . Hemorrhoids with bleeding (L lateral pile especially) 09/27/2011  . Obesity (BMI 30-39.9) 09/27/2011  . History of smoking 09/16/2010  . Family history of MI (myocardial infarction) 09/16/2010  . HIP REPLACEMENT, RIGHT, HX OF 02/05/2010  . GOUT 06/13/2008  . ALLERGIC RHINITIS 06/13/2008  . GERD 06/13/2008  . OSTEOARTHRITIS 06/13/2008    Past Medical History  Diagnosis Date  . Depression   . Diabetes mellitus   . Hyperlipidemia   . Hypertension   . Osteoarthritis   . Allergic rhinitis due to pollen   . GERD (gastroesophageal reflux disease)   . Gout   . Diverticulosis   . Personal history of colonic polyps     1996  . Low testosterone   . Iron deficiency   . Hypogonadism male 06/14/2012  . Chronic kidney disease, stage III (moderate) 12/19/2012  . Former very heavy cigarette smoker (more than 40 per day) 10/07/2014    Past Surgical History  Procedure Laterality Date  . Cholecystectomy  1980  . Total hip arthroplasty  2009    Dr. Gioffre  .   Total hip arthroplasty    . Cardiac catheterization  11/2011    [facility]    Social History   Social History  . Marital Status: Married    Spouse Name: N/A  . Number of Children: N/A  . 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 Smoking status: Former Smoker -- 3.00 packs/day for 35 years    Types: Cigarettes    Quit date: 04/12/2000  . Smokeless tobacco: Never Used  . Alcohol  Use: No  . Drug Use: No  . Sexual Activity: Not on file   Other Topics Concern  . Not on file   Social History Narrative   No regular exercise   Caffeine use: yes, dt coke    Family History  Problem Relation Age of Onset  . Diabetes Mother   . Diabetes Father   . Lung cancer Sister     lung  . Heart disease Maternal Grandfather   . COPD Sister   . Heart attack Sister   . Heart disease Sister     Allergies  Allergen Reactions  . Tetracycline Itching and Rash    Medication list reviewed and updated in full in [place] [facility].   GEN: No acute illnesses, no fevers, chills. GI: No n/v/d, eating normally Pulm: No SOB Interactive and getting along well at home.  Otherwise, ROS is as per the HPI.  Objective:   BP 132/70 mmHg  Pulse 80  Temp(Src) 97.6 F (36.4 C) (Oral)  Ht 6' (1.829 m)  Wt 256 lb 8 oz (116.348 kg)  BMI 34.78 kg/m2  GEN: WDWN, NAD, Non-toxic, A & O x 3 HEENT: Atraumatic, Normocephalic. Neck supple. No masses, No LAD. Ears and Nose: No external deformity. CV: RRR, No M/G/R. No JVD. No thrill. No extra heart sounds. PULM: CTA B, no wheezes, crackles, rhonchi. No retractions. No resp. distress. No accessory muscle use. EXTR: No c/c/e NEURO Normal gait. PERRLA, EOMI. Gait normal. Normal str.  PSYCH: Normally interactive. Conversant. Not depressed or anxious appearing.  Calm demeanor.   Laboratory and Imaging Data: Ct Head Wo Contrast  12/05/2014   CLINICAL DATA:  Headaches since yesterday.  EXAM: CT HEAD WITHOUT CONTRAST  TECHNIQUE: Contiguous axial images were obtained from the base of the skull through the vertex without intravenous contrast.  COMPARISON:  November 04, 2012  FINDINGS: There is no midline shift, hydrocephalus, or mass. No acute hemorrhage or acute transcortical infarct is identified. There is chronic diffuse atrophy. The bony calvarium is intact. The visualized sinuses are clear.  IMPRESSION: No focal acute intracranial abnormality  identified.  Chronic diffuse atrophy.   Electronically Signed   By: Wei-Chen  Lin M.D.   On: 12/05/2014 18:08   Mr Brain Wo Contrast  12/05/2014   CLINICAL DATA:  69-year-old male with headache since yesterday and expressive aphasia. Visual changes. Initial encounter.  EXAM: MRI HEAD WITHOUT CONTRAST  TECHNIQUE: Multiplanar, multiecho pulse sequences of the brain and surrounding structures were obtained without intravenous contrast.  COMPARISON:  Head CT without contrast 1804 hr today, and earlier  FINDINGS: Study is intermittently, mildly degraded by motion artifact despite repeated imaging attempts.  Cerebral volume is within normal limits for age. No restricted diffusion to suggest acute infarction. No midline shift, mass effect, evidence of mass lesion, ventriculomegaly, extra-axial collection or acute intracranial hemorrhage. Cervicomedullary junction and pituitary are within normal limits. Major intracranial vascular flow   voids are within normal limits.  Minimal to mild for age scattered small foci of cerebral white matter T2 and FLAIR hyperintensity, mostly in the left hemisphere. No cortical encephalomalacia or definite chronic cerebral blood products. Deep gray matter nuclei, brainstem, and cerebellum are within normal limits.  Visible internal auditory structures appear normal. Mastoids and paranasal sinuses are clear. Orbit and scalp soft tissues appear normal. Normal bone marrow signal. Negative visualized cervical spine.  IMPRESSION: 1.  No acute intracranial abnormality. 2. Mild for age nonspecific cerebral white matter signal changes, most commonly due to chronic small vessel disease.   Electronically Signed   By: Genevie Ann M.D.   On: 12/05/2014 20:13    Results for orders placed or performed in visit on 12/10/14  POCT HgB A1C  Result Value Ref Range   Hemoglobin A1C 9.4     Recent Results (from the past 2160 hour(s))  HM DIABETES EYE EXAM     Status: None   Collection Time: 10/01/14 12:00  AM  Result Value Ref Range   HM Diabetic Eye Exam No Retinopathy No Retinopathy  Testosterone, Free, Total, SHBG     Status: Abnormal   Collection Time: 10/18/14  9:12 AM  Result Value Ref Range   Testosterone 149 (L) 300 - 890 ng/dL    Comment:           Tanner Stage       Male              Male               I              < 30 ng/dL        < 10 ng/dL               II             < 150 ng/dL       < 30 ng/dL               III            100-320 ng/dL     < 35 ng/dL               IV             200-970 ng/dL     15-40 ng/dL               V/Adult        300-890 ng/dL     10-70 ng/dL      Sex Hormone Binding 10 (L) 22 - 77 nmol/L   Testosterone, Free 46.5 (L) 47.0 - 244.0 pg/mL    Comment:   The concentration of free testosterone is derived from a mathematical expression based on constants for the binding of testosterone to sex hormone-binding globulin and albumin.    Testosterone-% Free 3.1 (H) 1.6 - 2.9 %  TSH     Status: None   Collection Time: 10/18/14  9:12 AM  Result Value Ref Range   TSH 1.31 0.35 - 4.50 uIU/mL  Comprehensive metabolic panel     Status: Abnormal   Collection Time: 12/05/14  1:22 PM  Result Value Ref Range   Sodium 137 135 - 145 mEq/L   Potassium 4.2 3.5 - 5.1 mEq/L   Chloride 99 96 - 112 mEq/L   CO2 31 19 - 32 mEq/L   Glucose, Bld 85 70 - 99 mg/dL  BUN 18 6 - 23 mg/dL   Creatinine, Ser 1.31 0.40 - 1.50 mg/dL   Total Bilirubin 0.6 0.2 - 1.2 mg/dL   Alkaline Phosphatase 67 39 - 117 U/L   AST 22 0 - 37 U/L   ALT 23 0 - 53 U/L   Total Protein 7.5 6.0 - 8.3 g/dL   Albumin 4.2 3.5 - 5.2 g/dL   Calcium 9.7 8.4 - 10.5 mg/dL   GFR 57.65 (L) >60.00 mL/min  CBC with Differential/Platelet     Status: None   Collection Time: 12/05/14  1:22 PM  Result Value Ref Range   WBC 8.2 4.0 - 10.5 K/uL   RBC 4.71 4.22 - 5.81 Mil/uL   Hemoglobin 14.4 13.0 - 17.0 g/dL   HCT 42.3 39.0 - 52.0 %   MCV 89.8 78.0 - 100.0 fl   MCHC 34.0 30.0 - 36.0 g/dL   RDW 13.4 11.5  - 15.5 %   Platelets 187.0 150.0 - 400.0 K/uL   Neutrophils Relative % 66.8 43.0 - 77.0 %   Lymphocytes Relative 21.5 12.0 - 46.0 %   Monocytes Relative 10.4 3.0 - 12.0 %   Eosinophils Relative 1.0 0.0 - 5.0 %   Basophils Relative 0.3 0.0 - 3.0 %   Neutro Abs 5.5 1.4 - 7.7 K/uL   Lymphs Abs 1.8 0.7 - 4.0 K/uL   Monocytes Absolute 0.8 0.1 - 1.0 K/uL   Eosinophils Absolute 0.1 0.0 - 0.7 K/uL   Basophils Absolute 0.0 0.0 - 0.1 K/uL  Sedimentation rate     Status: Abnormal   Collection Time: 12/05/14  1:22 PM  Result Value Ref Range   Sed Rate 23 (H) 0 - 22 mm/hr  C-reactive protein     Status: None   Collection Time: 12/05/14  1:22 PM  Result Value Ref Range   CRP 0.8 0.5 - 20.0 mg/dL  Protime-INR     Status: None   Collection Time: 12/05/14  5:10 PM  Result Value Ref Range   Prothrombin Time 13.6 11.6 - 15.2 seconds   INR 1.02 0.00 - 1.49  APTT     Status: None   Collection Time: 12/05/14  5:10 PM  Result Value Ref Range   aPTT 28 24 - 37 seconds  CBC     Status: Abnormal   Collection Time: 12/05/14  5:10 PM  Result Value Ref Range   WBC 11.5 (H) 4.0 - 10.5 K/uL   RBC 4.65 4.22 - 5.81 MIL/uL   Hemoglobin 14.4 13.0 - 17.0 g/dL   HCT 41.1 39.0 - 52.0 %   MCV 88.4 78.0 - 100.0 fL   MCH 31.0 26.0 - 34.0 pg   MCHC 35.0 30.0 - 36.0 g/dL   RDW 12.7 11.5 - 15.5 %   Platelets 164 150 - 400 K/uL  Differential     Status: Abnormal   Collection Time: 12/05/14  5:10 PM  Result Value Ref Range   Neutrophils Relative % 81 (H) 43 - 77 %   Neutro Abs 9.3 (H) 1.7 - 7.7 K/uL   Lymphocytes Relative 10 (L) 12 - 46 %   Lymphs Abs 1.2 0.7 - 4.0 K/uL   Monocytes Relative 9 3 - 12 %   Monocytes Absolute 1.0 0.1 - 1.0 K/uL   Eosinophils Relative 0 0 - 5 %   Eosinophils Absolute 0.0 0.0 - 0.7 K/uL   Basophils Relative 0 0 - 1 %   Basophils Absolute 0.0 0.0 - 0.1 K/uL  Comprehensive metabolic   panel     Status: Abnormal   Collection Time: 12/05/14  5:10 PM  Result Value Ref Range   Sodium  135 135 - 145 mmol/L   Potassium 4.2 3.5 - 5.1 mmol/L   Chloride 97 (L) 101 - 111 mmol/L   CO2 29 22 - 32 mmol/L   Glucose, Bld 105 (H) 65 - 99 mg/dL   BUN 15 6 - 20 mg/dL   Creatinine, Ser 1.37 (H) 0.61 - 1.24 mg/dL   Calcium 9.4 8.9 - 10.3 mg/dL   Total Protein 7.5 6.5 - 8.1 g/dL   Albumin 4.1 3.5 - 5.0 g/dL   AST 30 15 - 41 U/L   ALT 26 17 - 63 U/L   Alkaline Phosphatase 70 38 - 126 U/L   Total Bilirubin 0.8 0.3 - 1.2 mg/dL   GFR calc non Af Amer 51 (L) >60 mL/min   GFR calc Af Amer 59 (L) >60 mL/min    Comment: (NOTE) The eGFR has been calculated using the CKD EPI equation. This calculation has not been validated in all clinical situations. eGFR's persistently <60 mL/min signify possible Chronic Kidney Disease.    Anion gap 9 5 - 15  I-stat troponin, ED (not at [facility], [facility])     Status: None   Collection Time: 12/05/14  5:48 PM  Result Value Ref Range   Troponin i, poc 0.00 0.00 - 0.08 ng/mL   Comment 3            Comment: Due to the release kinetics of cTnI, a negative result within the first hours of the onset of symptoms does not rule out myocardial infarction with certainty. If myocardial infarction is still suspected, repeat the test at appropriate intervals.   I-Stat Chem 8, ED  (not at [facility], [facility])     Status: Abnormal   Collection Time: 12/05/14  5:49 PM  Result Value Ref Range   Sodium 136 135 - 145 mmol/L   Potassium 4.2 3.5 - 5.1 mmol/L   Chloride 97 (L) 101 - 111 mmol/L   BUN 22 (H) 6 - 20 mg/dL   Creatinine, Ser 1.30 (H) 0.61 - 1.24 mg/dL   Glucose, Bld 105 (H) 65 - 99 mg/dL   Calcium, Ion 1.15 1.13 - 1.30 mmol/L   TCO2 27 0 - 100 mmol/L   Hemoglobin 15.0 13.0 - 17.0 g/dL   HCT 44.0 39.0 - 52.0 %  Urinalysis, Routine w reflex microscopic (not at [facility])     Status: Abnormal   Collection Time: 12/05/14  9:46 PM  Result Value Ref Range   Color, Urine AMBER (A) YELLOW    Comment: BIOCHEMICALS MAY BE AFFECTED BY COLOR   APPearance CLEAR CLEAR   Specific  Gravity, Urine 1.021 1.005 - 1.030   pH 5.0 5.0 - 8.0   Glucose, UA NEGATIVE NEGATIVE mg/dL   Hgb urine dipstick NEGATIVE NEGATIVE   Bilirubin Urine NEGATIVE NEGATIVE   Ketones, ur NEGATIVE NEGATIVE mg/dL   Protein, ur 30 (A) NEGATIVE mg/dL   Urobilinogen, UA 0.2 0.0 - 1.0 mg/dL   Nitrite NEGATIVE NEGATIVE   Leukocytes, UA NEGATIVE NEGATIVE  Urine microscopic-add on     Status: Abnormal   Collection Time: 12/05/14  9:46 PM  Result Value Ref Range   Squamous Epithelial / LPF RARE RARE   WBC, UA 3-6 <3 WBC/hpf   RBC / HPF 0-2 <3 RBC/hpf   Bacteria, UA RARE RARE   Casts HYALINE CASTS (A) NEGATIVE  POCT HgB A1C       Status: Abnormal   Collection Time: 12/10/14  4:21 PM  Result Value Ref Range   Hemoglobin A1C 9.4      Assessment and Plan:   Transient cerebral ischemia, unspecified transient cerebral ischemia type - Plan: VAS US CAROTID, Echocardiogram  Hypogonadism male - Plan: testosterone cypionate (DEPOTESTOTERONE CYPIONATE) injection 100 mg  Vision changes  Atypical chest pain  Check carotid US and echo for TIA work-up Secondary prevention already done with ASA, BP control, statin  Urged weight loss and improved diet and BS control  Orders Placed This Encounter  Procedures  . Echocardiogram    Signed,   T. , MD   Patient's Medications  New Prescriptions   No medications on file  Previous Medications   ASPIRIN 81 MG CHEWABLE TABLET    Chew 81 mg by mouth at bedtime.    ATENOLOL (TENORMIN) 100 MG TABLET    TAKE ONE TABLET BY MOUTH ONCE DAILY   B COMPLEX VITAMINS TABLET    Take 1 tablet by mouth daily.     CHOLECALCIFEROL (VITAMIN D) 1000 UNITS TABLET    Take 1,000 Units by mouth daily.     DOXAZOSIN (CARDURA) 8 MG TABLET    Take 1 tablet (8 mg total) by mouth at bedtime.   FAMOTIDINE (PEPCID) 10 MG TABLET    Take 10 mg by mouth daily.     FLUTICASONE (FLONASE) 50 MCG/ACT NASAL SPRAY    INHALE 2 SPRAYS INTO EACH NOSTRIL EVERY DAY. AS NEEDED    INSULIN NPH HUMAN (HUMULIN N,NOVOLIN N) 100 UNIT/ML INJECTION    Inject 0.5 mLs (50 Units total) into the skin 3 (three) times daily.   INSULIN REGULAR (NOVOLIN R,HUMULIN R) 100 UNITS/ML INJECTION    Inject up to 150 units a day as advised   LISINOPRIL-HYDROCHLOROTHIAZIDE (PRINZIDE,ZESTORETIC) 20-12.5 MG PER TABLET    TAKE ONE TABLET BY MOUTH ONCE DAILY   NYSTATIN CREAM (MYCOSTATIN)    Apply topically 2 (two) times daily as needed.   SIMVASTATIN (ZOCOR) 40 MG TABLET    TAKE ONE TABLET BY MOUTH EVERY NIGHT AT BEDTIME   TAMSULOSIN (FLOMAX) 0.4 MG CAPS CAPSULE    Take 1 capsule (0.4 mg total) by mouth daily.   TESTOSTERONE CYPIONATE (DEPOTESTOTERONE CYPIONATE) 100 MG/ML INJECTION    Inject 100 mg into the muscle every 14 (fourteen) days. For IM use only   VENLAFAXINE XR (EFFEXOR-XR) 75 MG 24 HR CAPSULE    TAKE THREE (3) CAPSULES BY MOUTH DAILY  Modified Medications   No medications on file  Discontinued Medications   TESTOSTERONE CYPIONATE (DEPOTESTOTERONE CYPIONATE) 100 MG/ML INJECTION    Inject 1 mL (100 mg total) into the muscle every 14 (fourteen) days. For IM use only      

## 2014-12-11 NOTE — Progress Notes (Signed)
Pre visit review using our clinic review tool, if applicable. No additional management support is needed unless otherwise documented below in the visit note. 

## 2014-12-12 ENCOUNTER — Ambulatory Visit: Payer: Medicare Other

## 2014-12-18 ENCOUNTER — Ambulatory Visit: Payer: Medicare Other | Admitting: Family Medicine

## 2014-12-24 ENCOUNTER — Other Ambulatory Visit: Payer: Self-pay

## 2014-12-24 ENCOUNTER — Ambulatory Visit (INDEPENDENT_AMBULATORY_CARE_PROVIDER_SITE_OTHER): Payer: Medicare Other

## 2014-12-24 ENCOUNTER — Other Ambulatory Visit: Payer: Self-pay | Admitting: Family Medicine

## 2014-12-24 DIAGNOSIS — H53143 Visual discomfort, bilateral: Secondary | ICD-10-CM

## 2014-12-24 DIAGNOSIS — R4701 Aphasia: Secondary | ICD-10-CM

## 2014-12-24 DIAGNOSIS — G459 Transient cerebral ischemic attack, unspecified: Secondary | ICD-10-CM

## 2014-12-25 ENCOUNTER — Encounter: Payer: Self-pay | Admitting: *Deleted

## 2014-12-25 ENCOUNTER — Other Ambulatory Visit: Payer: Self-pay | Admitting: Family Medicine

## 2014-12-26 ENCOUNTER — Ambulatory Visit (INDEPENDENT_AMBULATORY_CARE_PROVIDER_SITE_OTHER): Payer: Medicare Other | Admitting: *Deleted

## 2014-12-26 DIAGNOSIS — E291 Testicular hypofunction: Secondary | ICD-10-CM

## 2014-12-26 DIAGNOSIS — E349 Endocrine disorder, unspecified: Secondary | ICD-10-CM

## 2014-12-26 MED ORDER — TESTOSTERONE CYPIONATE 100 MG/ML IM SOLN
100.0000 mg | INTRAMUSCULAR | Status: DC
  Administered 2014-12-26: 100 mg via INTRAMUSCULAR

## 2015-01-09 ENCOUNTER — Ambulatory Visit (INDEPENDENT_AMBULATORY_CARE_PROVIDER_SITE_OTHER): Payer: Medicare Other

## 2015-01-09 DIAGNOSIS — R37 Sexual dysfunction, unspecified: Secondary | ICD-10-CM

## 2015-01-09 MED ORDER — TESTOSTERONE CYPIONATE 100 MG/ML IM SOLN
100.0000 mg | Freq: Once | INTRAMUSCULAR | Status: AC
  Administered 2015-01-09: 100 mg via INTRAMUSCULAR

## 2015-01-23 ENCOUNTER — Ambulatory Visit: Payer: Medicare Other

## 2015-01-24 ENCOUNTER — Ambulatory Visit (INDEPENDENT_AMBULATORY_CARE_PROVIDER_SITE_OTHER): Payer: Medicare Other | Admitting: *Deleted

## 2015-01-24 DIAGNOSIS — E349 Endocrine disorder, unspecified: Secondary | ICD-10-CM

## 2015-01-24 DIAGNOSIS — E291 Testicular hypofunction: Secondary | ICD-10-CM | POA: Diagnosis not present

## 2015-01-24 MED ORDER — TESTOSTERONE CYPIONATE 100 MG/ML IM SOLN
100.0000 mg | INTRAMUSCULAR | Status: DC
  Administered 2015-01-24: 100 mg via INTRAMUSCULAR

## 2015-02-06 ENCOUNTER — Telehealth: Payer: Self-pay | Admitting: *Deleted

## 2015-02-06 ENCOUNTER — Ambulatory Visit (INDEPENDENT_AMBULATORY_CARE_PROVIDER_SITE_OTHER): Payer: Medicare Other | Admitting: *Deleted

## 2015-02-06 DIAGNOSIS — E291 Testicular hypofunction: Secondary | ICD-10-CM | POA: Diagnosis not present

## 2015-02-06 DIAGNOSIS — E349 Endocrine disorder, unspecified: Secondary | ICD-10-CM

## 2015-02-06 MED ORDER — TESTOSTERONE CYPIONATE 100 MG/ML IM SOLN
100.0000 mg | Freq: Once | INTRAMUSCULAR | Status: AC
  Administered 2015-02-06: 100 mg via INTRAMUSCULAR

## 2015-02-06 NOTE — Telephone Encounter (Signed)
He should have a fasting blood draw 8-9 days after his injection, between the hours of 8-10 in the morning.   The timing after injection is very, very important.  Terri, can you help: 8 days from today  Total and Free testosterone: E29.1

## 2015-02-06 NOTE — Addendum Note (Signed)
Addended by: Ellamae Sia on: 02/06/2015 02:39 PM   Modules accepted: Orders

## 2015-02-06 NOTE — Telephone Encounter (Signed)
Pt was here today at the office for his testosterone inj and wanted me to send a message to Dr. Lorelei Pont. Pt said he has been getting testosterone inj for a few months and he wanted to know when should he get his labs done to have his levels rechecked

## 2015-02-07 NOTE — Telephone Encounter (Signed)
Made his lab appt, notified the pt

## 2015-02-14 ENCOUNTER — Other Ambulatory Visit (INDEPENDENT_AMBULATORY_CARE_PROVIDER_SITE_OTHER): Payer: Medicare Other

## 2015-02-14 DIAGNOSIS — E349 Endocrine disorder, unspecified: Secondary | ICD-10-CM

## 2015-02-14 DIAGNOSIS — E291 Testicular hypofunction: Secondary | ICD-10-CM | POA: Diagnosis not present

## 2015-02-17 LAB — TESTOSTERONE, FREE, TOTAL, SHBG
SEX HORMONE BINDING: 14 nmol/L — AB (ref 22–77)
TESTOSTERONE FREE: 78.3 pg/mL (ref 47.0–244.0)
TESTOSTERONE-% FREE: 2.9 % (ref 1.6–2.9)
Testosterone: 270 ng/dL — ABNORMAL LOW (ref 300–890)

## 2015-02-20 ENCOUNTER — Ambulatory Visit (INDEPENDENT_AMBULATORY_CARE_PROVIDER_SITE_OTHER): Payer: Medicare Other

## 2015-02-20 DIAGNOSIS — R37 Sexual dysfunction, unspecified: Secondary | ICD-10-CM

## 2015-02-20 MED ORDER — TESTOSTERONE CYPIONATE 100 MG/ML IM SOLN
100.0000 mg | Freq: Once | INTRAMUSCULAR | Status: AC
  Administered 2015-02-20: 100 mg via INTRAMUSCULAR

## 2015-03-04 ENCOUNTER — Encounter: Payer: Self-pay | Admitting: Internal Medicine

## 2015-03-04 ENCOUNTER — Ambulatory Visit (INDEPENDENT_AMBULATORY_CARE_PROVIDER_SITE_OTHER): Payer: Medicare Other | Admitting: Internal Medicine

## 2015-03-04 VITALS — BP 126/70 | HR 126 | Temp 98.2°F | Resp 12 | Wt 249.4 lb

## 2015-03-04 DIAGNOSIS — IMO0002 Reserved for concepts with insufficient information to code with codable children: Secondary | ICD-10-CM

## 2015-03-04 DIAGNOSIS — E1065 Type 1 diabetes mellitus with hyperglycemia: Secondary | ICD-10-CM

## 2015-03-04 DIAGNOSIS — E1040 Type 1 diabetes mellitus with diabetic neuropathy, unspecified: Secondary | ICD-10-CM | POA: Diagnosis not present

## 2015-03-04 NOTE — Patient Instructions (Signed)
Please move the evening N dose at bedtime.  Type of insulin Breakfast Lunch Dinner 10-11 pm  Humulin R 50 30-50 50   Humulin N 50   50   STOP Pretzels!!!  Please return in 3 months with your sugar log.   Please come back after 03/12/2015, fasting, in the morning for labs.

## 2015-03-04 NOTE — Progress Notes (Signed)
Patient ID: [name], male   DOB: 04-23-1945, 69 y.o.   [name]: [id number]  HPI: [name] is a 69 y.o.-year-old male, returning for f/u for DM2, dx 1997, insulin-dependent since 2010, uncontrolled, with complications (CKD stage 3, PN, ED, TIA). Last visit was 3 mo ago!   He lost 7 lbs since last visit.  Last hemoglobin A1c was: Lab Results  Component Value Date   HGBA1C 9.4 12/10/2014   HGBA1C 12.2* 09/10/2014   HGBA1C 12.0* 12/12/2013   He is on: Type of insulin Breakfast Lunch Dinner  Humulin R 50 30-50 50  Humulin N 50  50  He does not have problems mixing the 2 insulins. They are cheap: $24 per bottle from [place], while they were $200 at [facility]! Gets insulin w/o Rx's.  Pt checks his sugars 2x a day - very high at bedtime as he grazes on pretzels throughout the evening: - am: 150-170 >> 250 >> 130-140 >> 300-400s >> 168, 250-350 >> 86, 136-271, 297 >> 70, 96, 132-332 - 2h after b'fast: n/c  - before lunch: n/c >> 80, 114-289 >> n/c - 2h after lunch: n/c >> 300s >> n/c - before dinner: n/c >> 217-237 >> 70 (no lunch), 150 - 2h after dinner: n/c - bedtime: n/c >> 197-387 >> 251-401, 512, 534 x1 (ran out of insulin) - nighttime: 60s, wakes him up >> n/c No lows since last visit.  he has hypoglycemia awareness at 100.  Highest sugar was 500's.  Pt's meals are (he reduced bread, uses whole wheat now): - Breakfast: 2 slices buttered toast - Lunch: ham sandwich + chips - Dinner: meat + veggies + starch - Snacks: 4-5 x a day Eats dinner: 7-7:30 pm. Bedtime 2-3 am. Eats pretzels between dinner- bedtime.  - has CKD, last BUN/creatinine:  Lab Results  Component Value Date   BUN 22* 12/05/2014   CREATININE 1.30* 12/05/2014  Last ACR 4 on 12/12/2012. On Lisinopril 20.  - last set of lipids: Lab Results  Component Value Date   CHOL 159 12/12/2013   HDL 29.30* 12/12/2013   LDLCALC 43 04/24/2009   LDLDIRECT 64.1 12/12/2013   TRIG 635.0* 12/12/2013   CHOLHDL 5  12/12/2013  On Zocor 40. - last eye exam was in 10/01/2014. No DR.  -+ numbness and tingling in his feet, especially at night.  I reviewed pt's medications, allergies, PMH, social hx, family hx, and changes were documented in the history of present illness. Otherwise, unchanged from my initial visit note.  ROS: Constitutional: + weight gain (loss, per our scale), no fatigue, no subjective hyperthermia/hypothermia, + nocturia x1-2 Eyes: no blurry vision, no xerophthalmia ENT: no sore throat, no nodules palpated in throat, no dysphagia/odynophagia Cardiovascular: no CP/SOB/palpitations/+ leg swelling Respiratory: no cough/no SOB/+ wheezing Gastrointestinal: no N/V/D/C/heartburn Musculoskeletal: no muscle/no joint aches Skin: no rashes Neurological: no tremors/numbness/tingling/dizziness  PE: BP 126/70 mmHg  Pulse 126  Temp(Src) 98.2 F (36.8 C) (Oral)  Resp 12  Wt 249 lb 6.4 oz (113.127 kg)  SpO2 94% Body mass index is 33.82 kg/(m^2). Wt Readings from Last 3 Encounters:  03/04/15 249 lb 6.4 oz (113.127 kg)  12/11/14 256 lb 8 oz (116.348 kg)  12/10/14 257 lb 6.4 oz (116.756 kg)   Constitutional: overweight, in NAD Eyes: PERRLA, EOMI, no exophthalmos ENT: moist mucous membranes, no thyromegaly, no cervical lymphadenopathy, + tinnitus Cardiovascular: tachycardia, RR, No MRG, + visible varicose veins L leg Respiratory: CTA B Gastrointestinal: abdomen soft, NT, ND, BS+ Musculoskeletal: no  deformities, strength intact in all 4 Skin: moist, warm, no rashes  ASSESSMENT: 1. DM2, insulin-dependent, uncontrolled, with complications - noncompliance with visits, insulin doses, CBG checks - CKD stage 3 - PN - ED - TIA  PLAN:  1. Patient with long-standing, uncontrolled diabetes, now with slightly better control. He is now on a basal-bolus NPH and regular insulin regimen. Checking sugars more consistently. I again suggested to start U500 insulin as he is using now 200 units insulin a  day >> but too expensive. Last HbA1c was reviewed >> 9.4%, decrease by 3% from before. Sugars at bedtime very high 2/2 Pretzels >> suggested to STOP these (his BP is also high and this may be from all the salt in the pretzels... - I suggested to move NPH at night so that he does not drop her sugars as much from bedtime to am (now: from 300-400 - 80-1180) Patient Instructions   Please move the evening N dose at bedtime.  Type of insulin Breakfast Lunch Dinner 10-11 pm  Humulin R 50 30-50 50   Humulin N 50   50   STOP Pretzels!!!  Please return in 3 months with your sugar log.   Please come back after 03/12/2015, fasting, in the morning for labs.  - continue checking sugars at different times of the day - check 3 times a day, rotating checks - UTD with eye exams - will check lipids fasting and also add HbA1c to be done in 9 days - not 3 mo yet) - RTC in 3 mo

## 2015-03-05 ENCOUNTER — Ambulatory Visit (INDEPENDENT_AMBULATORY_CARE_PROVIDER_SITE_OTHER): Payer: Medicare Other

## 2015-03-05 DIAGNOSIS — R37 Sexual dysfunction, unspecified: Secondary | ICD-10-CM

## 2015-03-05 MED ORDER — TESTOSTERONE CYPIONATE 100 MG/ML IM SOLN
100.0000 mg | Freq: Once | INTRAMUSCULAR | Status: AC
  Administered 2015-03-05: 100 mg via INTRAMUSCULAR

## 2015-03-19 ENCOUNTER — Ambulatory Visit (INDEPENDENT_AMBULATORY_CARE_PROVIDER_SITE_OTHER): Payer: Medicare Other | Admitting: *Deleted

## 2015-03-19 DIAGNOSIS — Z23 Encounter for immunization: Secondary | ICD-10-CM

## 2015-03-19 DIAGNOSIS — E349 Endocrine disorder, unspecified: Secondary | ICD-10-CM

## 2015-03-19 DIAGNOSIS — E291 Testicular hypofunction: Secondary | ICD-10-CM

## 2015-03-19 MED ORDER — TESTOSTERONE CYPIONATE 100 MG/ML IM SOLN
100.0000 mg | INTRAMUSCULAR | Status: DC
  Administered 2015-03-19: 100 mg via INTRAMUSCULAR

## 2015-03-27 ENCOUNTER — Ambulatory Visit: Payer: Medicare Other | Admitting: Primary Care

## 2015-03-28 ENCOUNTER — Ambulatory Visit (INDEPENDENT_AMBULATORY_CARE_PROVIDER_SITE_OTHER): Payer: Medicare Other | Admitting: Family Medicine

## 2015-03-28 ENCOUNTER — Encounter: Payer: Self-pay | Admitting: Family Medicine

## 2015-03-28 VITALS — BP 156/78 | HR 101 | Temp 97.7°F | Wt 252.8 lb

## 2015-03-28 DIAGNOSIS — J0100 Acute maxillary sinusitis, unspecified: Secondary | ICD-10-CM | POA: Diagnosis not present

## 2015-03-28 MED ORDER — AMOXICILLIN-POT CLAVULANATE 875-125 MG PO TABS: 1.0000 | ORAL_TABLET | Freq: Two times a day (BID) | ORAL | Status: DC

## 2015-03-28 NOTE — Patient Instructions (Signed)
Start augmentin, try to get some rest, and keep working on your sugar.  Take care.  Glad to see you.

## 2015-03-28 NOTE — Progress Notes (Signed)
Pre visit review using our clinic review tool, if applicable. No additional management support is needed unless otherwise documented below in the visit note.  He forgot his BP meds this AM.   Sick for about 1 week.  First noted post nasal gtt.  Discolored rhinorrhea.  Dm2 hx noted.   No wheeze.  No chest sx.  Some UAN noted by patient.  Some facial pain, maxillary.  No ear pain.  Some ST.  No vision changes.   No fevers.    Meds, vitals, and allergies reviewed.   ROS: See HPI.  Otherwise, noncontributory.  GEN: nad, alert and oriented HEENT: mucous membranes moist, tm w/o erythema, nasal exam w/o erythema, clear discharge noted,  OP with cobblestoning, Max sinus ttp B NECK: supple w/o LA CV: rrr.   PULM: ctab, no inc wob EXT: no edema

## 2015-03-30 DIAGNOSIS — J019 Acute sinusitis, unspecified: Secondary | ICD-10-CM | POA: Insufficient documentation

## 2015-03-30 NOTE — Assessment & Plan Note (Signed)
Nontoxic, start augmentin, f/u prn.  Rest and fluids, d/w pt about continued work on DM2 diet and control in general.

## 2015-04-02 ENCOUNTER — Ambulatory Visit (INDEPENDENT_AMBULATORY_CARE_PROVIDER_SITE_OTHER): Payer: Medicare Other | Admitting: *Deleted

## 2015-04-02 DIAGNOSIS — E291 Testicular hypofunction: Secondary | ICD-10-CM | POA: Diagnosis not present

## 2015-04-02 DIAGNOSIS — E349 Endocrine disorder, unspecified: Secondary | ICD-10-CM

## 2015-04-02 MED ORDER — TESTOSTERONE CYPIONATE 100 MG/ML IM SOLN
100.0000 mg | INTRAMUSCULAR | Status: DC
  Administered 2015-04-02: 100 mg via INTRAMUSCULAR

## 2015-04-04 ENCOUNTER — Other Ambulatory Visit: Payer: Self-pay | Admitting: Family Medicine

## 2015-04-04 NOTE — Telephone Encounter (Signed)
Patient advised he is due for follow up and labs.  Will call back to schedule.

## 2015-04-09 DIAGNOSIS — D485 Neoplasm of uncertain behavior of skin: Secondary | ICD-10-CM | POA: Diagnosis not present

## 2015-04-09 DIAGNOSIS — L723 Sebaceous cyst: Secondary | ICD-10-CM | POA: Diagnosis not present

## 2015-04-09 DIAGNOSIS — L918 Other hypertrophic disorders of the skin: Secondary | ICD-10-CM | POA: Diagnosis not present

## 2015-04-10 ENCOUNTER — Encounter: Payer: Self-pay | Admitting: *Deleted

## 2015-04-11 DIAGNOSIS — D2272 Melanocytic nevi of left lower limb, including hip: Secondary | ICD-10-CM | POA: Diagnosis not present

## 2015-04-16 ENCOUNTER — Ambulatory Visit (INDEPENDENT_AMBULATORY_CARE_PROVIDER_SITE_OTHER): Payer: Medicare Other

## 2015-04-16 DIAGNOSIS — R37 Sexual dysfunction, unspecified: Secondary | ICD-10-CM | POA: Diagnosis not present

## 2015-04-16 MED ORDER — TESTOSTERONE CYPIONATE 100 MG/ML IM SOLN
100.0000 mg | Freq: Once | INTRAMUSCULAR | Status: AC
  Administered 2015-04-16: 100 mg via INTRAMUSCULAR

## 2015-05-01 ENCOUNTER — Ambulatory Visit (INDEPENDENT_AMBULATORY_CARE_PROVIDER_SITE_OTHER): Payer: Medicare Other

## 2015-05-01 DIAGNOSIS — R37 Sexual dysfunction, unspecified: Secondary | ICD-10-CM

## 2015-05-01 MED ORDER — TESTOSTERONE CYPIONATE 100 MG/ML IM SOLN
100.0000 mg | Freq: Once | INTRAMUSCULAR | Status: AC
  Administered 2015-05-01: 100 mg via INTRAMUSCULAR

## 2015-05-09 ENCOUNTER — Other Ambulatory Visit: Payer: Self-pay | Admitting: Family Medicine

## 2015-05-09 ENCOUNTER — Ambulatory Visit: Payer: Medicare Other | Admitting: Family Medicine

## 2015-05-09 ENCOUNTER — Ambulatory Visit (INDEPENDENT_AMBULATORY_CARE_PROVIDER_SITE_OTHER): Payer: Medicare Other | Admitting: Family Medicine

## 2015-05-09 ENCOUNTER — Encounter: Payer: Self-pay | Admitting: Family Medicine

## 2015-05-09 VITALS — BP 130/66 | HR 93 | Temp 98.1°F | Wt 250.5 lb

## 2015-05-09 DIAGNOSIS — M5410 Radiculopathy, site unspecified: Secondary | ICD-10-CM | POA: Diagnosis not present

## 2015-05-09 MED ORDER — HYDROCODONE-ACETAMINOPHEN 5-325 MG PO TABS: 1.0000 | ORAL_TABLET | Freq: Four times a day (QID) | ORAL | Status: DC | PRN

## 2015-05-09 NOTE — Patient Instructions (Signed)
Use the pain medicine for now, gently stretch and walk and tolerated and update Dr. Lorelei Pont if not better.  Take care.  Glad to see you.  The pain medicine can make you constipated and/or drowsy.

## 2015-05-09 NOTE — Progress Notes (Signed)
Pre visit review using our clinic review tool, if applicable. No additional management support is needed unless otherwise documented below in the visit note.  R lower back pain from the midline laterally.  Was episodic, now more constant.  Can radiate to the R groin.  H/o R hip replacement. Recently with some R posterior thigh pain.  No trauma, no trigger.  No L sided sx.  No B/B incontinence.  No rash.  Pain going on for about 5 days.  Taking ibuprofen 800 w/o relief.  Walking with a cane.    Meds, vitals, and allergies reviewed.   ROS: See HPI.  Otherwise, noncontributory.  Nad but uncomfortable rrr ctab Back w/o midline pain No rash R SLR positive.   S/S wnl BLE on gross testing.  Gait with limp from R lower back pain.   No weakness in the BLE He has limited int/ext rotation at the hip, but that is likely from underlying OA and prev replacement, not an acute change.  No pain with int/ext rotation, as his range allows.

## 2015-05-11 DIAGNOSIS — M5410 Radiculopathy, site unspecified: Secondary | ICD-10-CM | POA: Insufficient documentation

## 2015-05-11 NOTE — Assessment & Plan Note (Signed)
Possible R L1 distribution radiculopathy.  D/w pt.  I don't think this hip is compromised.  Can't use pred given his DM hx.   Okay for stretching and hydrocodone for now, with f/u with PCP if needed. We didn't need to image with recent sx and no weakness. D/w pt.  He agrees. Okay for outpatient f/u.

## 2015-05-14 ENCOUNTER — Ambulatory Visit: Payer: Medicare Other

## 2015-05-15 ENCOUNTER — Ambulatory Visit (INDEPENDENT_AMBULATORY_CARE_PROVIDER_SITE_OTHER): Payer: Medicare Other

## 2015-05-15 ENCOUNTER — Ambulatory Visit: Payer: Medicare Other

## 2015-05-15 DIAGNOSIS — M25551 Pain in right hip: Secondary | ICD-10-CM | POA: Diagnosis not present

## 2015-05-15 DIAGNOSIS — Z96641 Presence of right artificial hip joint: Secondary | ICD-10-CM | POA: Diagnosis not present

## 2015-05-15 DIAGNOSIS — R37 Sexual dysfunction, unspecified: Secondary | ICD-10-CM | POA: Diagnosis not present

## 2015-05-15 MED ORDER — TESTOSTERONE CYPIONATE 100 MG/ML IM SOLN
100.0000 mg | Freq: Once | INTRAMUSCULAR | Status: AC
  Administered 2015-05-15: 100 mg via INTRAMUSCULAR

## 2015-05-26 ENCOUNTER — Other Ambulatory Visit: Payer: Self-pay | Admitting: Family Medicine

## 2015-05-28 ENCOUNTER — Encounter: Payer: Self-pay | Admitting: Family Medicine

## 2015-05-28 ENCOUNTER — Ambulatory Visit (INDEPENDENT_AMBULATORY_CARE_PROVIDER_SITE_OTHER)
Admission: RE | Admit: 2015-05-28 | Discharge: 2015-05-28 | Disposition: A | Discharge status: Home / Self Care | Payer: Medicare Other | Source: Ambulatory Visit | Attending: Family Medicine | Admitting: Family Medicine

## 2015-05-28 ENCOUNTER — Ambulatory Visit (INDEPENDENT_AMBULATORY_CARE_PROVIDER_SITE_OTHER): Payer: Medicare Other | Admitting: Family Medicine

## 2015-05-28 VITALS — BP 140/70 | HR 107 | Temp 97.5°F | Ht 72.0 in | Wt 248.8 lb

## 2015-05-28 DIAGNOSIS — E291 Testicular hypofunction: Secondary | ICD-10-CM | POA: Diagnosis not present

## 2015-05-28 DIAGNOSIS — E1169 Type 2 diabetes mellitus with other specified complication: Secondary | ICD-10-CM

## 2015-05-28 DIAGNOSIS — M5417 Radiculopathy, lumbosacral region: Secondary | ICD-10-CM | POA: Diagnosis not present

## 2015-05-28 DIAGNOSIS — N521 Erectile dysfunction due to diseases classified elsewhere: Secondary | ICD-10-CM

## 2015-05-28 DIAGNOSIS — E0842 Diabetes mellitus due to underlying condition with diabetic polyneuropathy: Secondary | ICD-10-CM

## 2015-05-28 DIAGNOSIS — M4726 Other spondylosis with radiculopathy, lumbar region: Secondary | ICD-10-CM | POA: Diagnosis not present

## 2015-05-28 DIAGNOSIS — M5416 Radiculopathy, lumbar region: Secondary | ICD-10-CM

## 2015-05-28 DIAGNOSIS — M5116 Intervertebral disc disorders with radiculopathy, lumbar region: Secondary | ICD-10-CM | POA: Diagnosis not present

## 2015-05-28 HISTORY — DX: Type 2 diabetes mellitus with other specified complication: E11.69

## 2015-05-28 HISTORY — DX: Type 2 diabetes mellitus with other specified complication: N52.1

## 2015-05-28 MED ORDER — GABAPENTIN 300 MG PO CAPS: 300.0000 mg | ORAL_CAPSULE | Freq: Three times a day (TID) | ORAL | Status: DC

## 2015-05-28 MED ORDER — SILDENAFIL CITRATE 20 MG PO TABS: ORAL_TABLET | ORAL | Status: DC

## 2015-05-28 NOTE — Progress Notes (Signed)
Dr. Frederico Hamman T. Aimy Sweeting, MD, [facility] [place], [address] Phone: [phone] Fax: [phone]  05/28/2015  Patient: [name], [name]: [id number], DOB: 1945-08-13, 70 y.o.  Primary Physician:  [name], MD   Chief Complaint  Patient presents with  . Back Pain   Subjective:   [name] is a 70 y.o. very pleasant male patient who presents with the following: Back Pain  ongoing for approximately: 3-4 mo The patient has had back pain before. The back pain is localized into the lumbar spine area. They also describe R radiculopathy.  +Tingling.  No bowel or bladder incontinence. No focal weakness. Prior interventions: traction x 3 mo from prior trauma many years ago Physical therapy: No Chiropractic manipulations: No Acupuncture: No Osteopathic manipulation: No Heat or cold: Minimal effect  Hypogonadism and ED Lab Results  Component Value Date   TESTOSTERONE 270* 02/14/2015     Past Medical History, Surgical History, Family History, Medications, Allergies have been reviewed and updated if relevant.  Patient Active Problem List   Diagnosis Date Noted  . Type 1 diabetes mellitus with neurological manifestations, uncontrolled ([place]) 08/05/2011    Priority: High  . Former very heavy cigarette smoker (more than 40 per day) 10/07/2014    Priority: Medium  . Chronic kidney disease, stage III (moderate) 12/19/2012    Priority: Medium  . OSA (obstructive sleep apnea) 03/09/2011    Priority: Medium  . Diabetic neuropathy ([place]) 12/29/2010    Priority: Medium  . HYPERLIPIDEMIA 06/13/2008    Priority: Medium  . Major depressive disorder, recurrent episode, in partial remission ([place]) 06/13/2008    Priority: Medium  . HYPERTENSION 06/13/2008    Priority: Medium  . Erectile dysfunction associated with type 2 diabetes mellitus ([place]) 05/28/2015  . Hypogonadism male 06/14/2012  . Hemorrhoids with  bleeding (L lateral pile especially) 09/27/2011  . Obesity (BMI 30-39.9) 09/27/2011  . History of smoking 09/16/2010  . Family history of MI (myocardial infarction) 09/16/2010  . HIP REPLACEMENT, RIGHT, HX OF 02/05/2010  . GOUT 06/13/2008  . ALLERGIC RHINITIS 06/13/2008  . GERD 06/13/2008  . OSTEOARTHRITIS 06/13/2008    Past Medical History  Diagnosis Date  . Depression   . Diabetes mellitus ([place])   . Hyperlipidemia   . Hypertension   . Osteoarthritis   . Allergic rhinitis due to pollen   . GERD (gastroesophageal reflux disease)   . Gout   . Diverticulosis   . Personal history of colonic polyps     1996  . Low testosterone   . Iron deficiency   . Hypogonadism male 06/14/2012  . Chronic kidney disease, stage III (moderate) 12/19/2012  . Former very heavy cigarette smoker (more than 40 per day) 10/07/2014  . Erectile dysfunction associated with type 2 diabetes mellitus ([place]) 05/28/2015    Past Surgical History  Procedure Laterality Date  . Cholecystectomy  1980  . Total hip arthroplasty  2009    Dr. Gladstone Lighter  . Total hip arthroplasty    . Cardiac catheterization  11/2011    [facility]    Social History   Social History  . Marital Status: Married    Spouse Name: N/A  . Number of Children: N/A  . Years of Education: N/A   Occupational History  . Dispatcher   . retired Administrator    Social History Main Topics  . Smoking status: Former Smoker -- 3.00 packs/day for 35 years  Types: Cigarettes    Quit date: 04/12/2000  . Smokeless tobacco: Never Used  . Alcohol Use: No  . Drug Use: No  . Sexual Activity: Not on file   Other Topics Concern  . Not on file   Social History Narrative   No regular exercise   Caffeine use: yes, dt coke    Family History  Problem Relation Age of Onset  . Diabetes Mother   . Diabetes Father   . Lung cancer Sister     lung  . Heart disease Maternal Grandfather   . COPD Sister   . Heart attack Sister   . Heart disease Sister      Allergies  Allergen Reactions  . Tetracycline Itching and Rash    Medication list reviewed and updated in full in [place].  GEN: No fevers, chills. Nontoxic. Primarily MSK c/o today. MSK: Detailed in the HPI GI: tolerating PO intake without difficulty Neuro: As above  Otherwise the pertinent positives of the ROS are noted above.    Objective:   Blood pressure 140/70, pulse 107, temperature 97.5 F (36.4 C), temperature source Oral, height 6' (1.829 m), weight 248 lb 12 oz (112.832 kg).  Gen: Well-developed,well-nourished,in no acute distress; alert,appropriate and cooperative throughout examination HEENT: Normocephalic and atraumatic without obvious abnormalities.  Ears, externally no deformities Pulm: Breathing comfortably in no respiratory distress Range of motion at  the waist: Flexion, rotation and lateral bending: approaching full  No echymosis or edema Rises to examination table with no difficulty Gait: minimally antalgic  Inspection/Deformity: No abnormality Paraspinus T:  l3-s1 b ttp  B Ankle Dorsiflexion (L5,4): 5/5 B Great Toe Dorsiflexion (L5,4): 5/5 Heel Walk (L5): WNL Toe Walk (S1): WNL Rise/Squat (L4): WNL, mild pain  SENSORY B Medial Foot (L4): WNL B Dorsum (L5): WNL B Lateral (S1): WNL Light Touch: WNL Pinprick: WNL  REFLEXES Knee (L4): 2+ Ankle (S1): 2+  B SLR, seated: neg B SLR, supine: neg B FABER: neg B Reverse FABER: neg B Greater Troch: NT B Log Roll: neg  Radiology: pending  Assessment and Plan:   Lumbar back pain with radiculopathy affecting right lower extremity - Plan: DG Lumbar Spine Complete  Hypogonadism in male - Plan: Testosterone, Free, Total, SHBG  Diabetic polyneuropathy associated with diabetes mellitus due to underlying condition (HCC)  Erectile dysfunction associated with type 2 diabetes mellitus (HCC)  LBP with radiculopathy on the R  Neurontin taper for LBP and neuropathy with f/u a1c at least  9  Follow-up: 6 weeks  New Prescriptions   GABAPENTIN (NEURONTIN) 300 MG CAPSULE    Take 1 capsule (300 mg total) by mouth 3 (three) times daily.   SILDENAFIL (REVATIO) 20 MG TABLET    Generic Revatio / Sildanefil 20 mg. 2 - 5 tabs 30 mins prior to intercourse.   Modified Medications   No medications on file   Orders Placed This Encounter  Procedures  . DG Lumbar Spine Complete  . Testosterone, Free, Total, SHBG   Patient Instructions  Generic Gabapentin Titration Schedule  Generic Gabapentin (generic form of Neurontin) comes in 300 mg tablets or capsules.   You have to titrate your dose slowly to reduce side effects and reduce sedation / sleepiness.    Week               Breakfast  Lunch   Dinner One                 0   0  300 mg Two   '300mg'$    0   '300mg'$  Three  '300mg'$    '300mg'$    '300mg'$  Four   '300mg'$    '300mg'$    '600mg'$  (2 tabs) Five   '600mg'$  (2 tabs) '300mg'$    '600mg'$  (2 tabs) Six   '600mg'$  (2 tabs) '600mg'$  (2 tabs) '600mg'$  (2 tabs) Seven  '600mg'$  (2 tabs) '600mg'$  (2 tabs) '900mg'$  (3 tabs) Eight   '900mg'$  (3 tabs) '600mg'$  (2 tabs) '900mg'$  (3 tabs)  If you have any problems at any time, drop back to the previous dosing schedule. Continue with this dose for 1 week, and then try to go up the next step again.       Signed,  Maud Deed. Deaire Mcwhirter, MD   Patient's Medications  New Prescriptions   GABAPENTIN (NEURONTIN) 300 MG CAPSULE    Take 1 capsule (300 mg total) by mouth 3 (three) times daily.   SILDENAFIL (REVATIO) 20 MG TABLET    Generic Revatio / Sildanefil 20 mg. 2 - 5 tabs 30 mins prior to intercourse.  Previous Medications   ASPIRIN 81 MG CHEWABLE TABLET    Chew 81 mg by mouth at bedtime.    ATENOLOL (TENORMIN) 100 MG TABLET    TAKE ONE TABLET BY MOUTH ONCE DAILY   B COMPLEX VITAMINS TABLET    Take 1 tablet by mouth daily.     CHOLECALCIFEROL (VITAMIN D) 1000 UNITS TABLET    Take 1,000 Units by mouth daily.     DOXAZOSIN (CARDURA) 8 MG TABLET    Take 1 tablet (8 mg total) by mouth at bedtime.    FAMOTIDINE (PEPCID) 10 MG TABLET    Take 10 mg by mouth daily.     FLUTICASONE (FLONASE) 50 MCG/ACT NASAL SPRAY    INHALE 2 SPRAYS INTO EACH NOSTRIL EVERY DAY AS NEEDED   HYDROCODONE-ACETAMINOPHEN (NORCO/VICODIN) 5-325 MG TABLET    Take 1 tablet by mouth every 6 (six) hours as needed for moderate pain.   INSULIN NPH HUMAN (HUMULIN N,NOVOLIN N) 100 UNIT/ML INJECTION    Inject 0.5 mLs (50 Units total) into the skin 3 (three) times daily.   INSULIN REGULAR (NOVOLIN R,HUMULIN R) 100 UNITS/ML INJECTION    Inject up to 150 units a day as advised   LISINOPRIL-HYDROCHLOROTHIAZIDE (PRINZIDE,ZESTORETIC) 20-12.5 MG PER TABLET    TAKE ONE TABLET BY MOUTH ONCE DAILY   NYSTATIN CREAM (MYCOSTATIN)    Apply topically 2 (two) times daily as needed.   SIMVASTATIN (ZOCOR) 40 MG TABLET    TAKE 1 TABLET BY MOUTH AT BEDTIME *NEED OFFICE VISIT   TAMSULOSIN (FLOMAX) 0.4 MG CAPS CAPSULE    Take 1 capsule (0.4 mg total) by mouth daily.   TESTOSTERONE CYPIONATE (DEPOTESTOTERONE CYPIONATE) 100 MG/ML INJECTION    Inject 100 mg into the muscle every 14 (fourteen) days. For IM use only   VENLAFAXINE XR (EFFEXOR-XR) 75 MG 24 HR CAPSULE    TAKE THREE (3) CAPSULES BY MOUTH DAILY  Modified Medications   No medications on file  Discontinued Medications   No medications on file

## 2015-05-28 NOTE — Progress Notes (Signed)
Pre visit review using our clinic review tool, if applicable. No additional management support is needed unless otherwise documented below in the visit note. 

## 2015-05-28 NOTE — Patient Instructions (Signed)
Generic Gabapentin Titration Schedule  Generic Gabapentin (generic form of Neurontin) comes in 300 mg tablets or capsules.   You have to titrate your dose slowly to reduce side effects and reduce sedation / sleepiness.    Week               Breakfast  Lunch   Dinner One                 0   0   300 mg Two   300mg   0   300mg Three  300mg   300mg   300mg Four   300mg   300mg   600mg (2 tabs) Five   600mg (2 tabs) 300mg   600mg (2 tabs) Six   600mg (2 tabs) 600mg (2 tabs) 600mg (2 tabs) Seven  600mg (2 tabs) 600mg (2 tabs) 900mg (3 tabs) Eight   900mg (3 tabs) 600mg (2 tabs) 900mg (3 tabs)  If you have any problems at any time, drop back to the previous dosing schedule. Continue with this dose for 1 week, and then try to go up the next step again.  

## 2015-05-29 ENCOUNTER — Ambulatory Visit (INDEPENDENT_AMBULATORY_CARE_PROVIDER_SITE_OTHER): Payer: Medicare Other

## 2015-05-29 DIAGNOSIS — R37 Sexual dysfunction, unspecified: Secondary | ICD-10-CM

## 2015-05-29 MED ORDER — TESTOSTERONE CYPIONATE 100 MG/ML IM SOLN
100.0000 mg | Freq: Once | INTRAMUSCULAR | Status: AC
  Administered 2015-05-29: 100 mg via INTRAMUSCULAR

## 2015-06-05 ENCOUNTER — Other Ambulatory Visit: Payer: Self-pay | Admitting: Family Medicine

## 2015-06-05 ENCOUNTER — Ambulatory Visit: Payer: Medicare Other | Admitting: Internal Medicine

## 2015-06-05 ENCOUNTER — Other Ambulatory Visit (INDEPENDENT_AMBULATORY_CARE_PROVIDER_SITE_OTHER): Payer: Medicare Other

## 2015-06-05 DIAGNOSIS — E1040 Type 1 diabetes mellitus with diabetic neuropathy, unspecified: Secondary | ICD-10-CM

## 2015-06-05 DIAGNOSIS — E785 Hyperlipidemia, unspecified: Secondary | ICD-10-CM

## 2015-06-05 DIAGNOSIS — E291 Testicular hypofunction: Secondary | ICD-10-CM

## 2015-06-05 DIAGNOSIS — E1065 Type 1 diabetes mellitus with hyperglycemia: Secondary | ICD-10-CM

## 2015-06-05 DIAGNOSIS — IMO0002 Reserved for concepts with insufficient information to code with codable children: Secondary | ICD-10-CM

## 2015-06-05 LAB — LIPID PANEL
CHOL/HDL RATIO: 5
Cholesterol: 154 mg/dL (ref 0–200)
HDL: 30.6 mg/dL — AB (ref >39.00)
Triglycerides: 503 mg/dL — ABNORMAL HIGH (ref 0.0–149.0)

## 2015-06-05 LAB — LDL CHOLESTEROL, DIRECT: Direct LDL: 73 mg/dL

## 2015-06-05 LAB — HEMOGLOBIN A1C: Hgb A1c MFr Bld: 9.4 % — ABNORMAL HIGH (ref 4.6–6.5)

## 2015-06-06 LAB — TESTOSTERONE, FREE AND TOTAL (INCLUDES SHBG)-(MALES)
SEX HORMONE BINDING: 15 nmol/L — AB (ref 22–77)
TESTOSTERONE-% FREE: 2.9 % (ref 1.6–2.9)
TESTOSTERONE: 365 ng/dL (ref 250–827)
Testosterone, Free: 106 pg/mL (ref 47.0–244.0)

## 2015-06-09 ENCOUNTER — Encounter: Payer: Self-pay | Admitting: Family Medicine

## 2015-06-12 ENCOUNTER — Ambulatory Visit (INDEPENDENT_AMBULATORY_CARE_PROVIDER_SITE_OTHER): Payer: Medicare Other | Admitting: *Deleted

## 2015-06-12 DIAGNOSIS — E291 Testicular hypofunction: Secondary | ICD-10-CM

## 2015-06-12 DIAGNOSIS — R7989 Other specified abnormal findings of blood chemistry: Secondary | ICD-10-CM

## 2015-06-12 MED ORDER — TESTOSTERONE CYPIONATE 100 MG/ML IM SOLN
100.0000 mg | INTRAMUSCULAR | Status: DC
  Administered 2015-06-12: 100 mg via INTRAMUSCULAR

## 2015-06-26 ENCOUNTER — Ambulatory Visit (INDEPENDENT_AMBULATORY_CARE_PROVIDER_SITE_OTHER): Payer: Medicare Other | Admitting: *Deleted

## 2015-06-26 DIAGNOSIS — E291 Testicular hypofunction: Secondary | ICD-10-CM

## 2015-06-26 DIAGNOSIS — E349 Endocrine disorder, unspecified: Secondary | ICD-10-CM

## 2015-06-26 MED ORDER — TESTOSTERONE CYPIONATE 100 MG/ML IM SOLN
100.0000 mg | Freq: Once | INTRAMUSCULAR | Status: AC
  Administered 2015-06-26: 100 mg via INTRAMUSCULAR

## 2015-07-09 ENCOUNTER — Encounter: Payer: Self-pay | Admitting: Family Medicine

## 2015-07-09 ENCOUNTER — Ambulatory Visit (INDEPENDENT_AMBULATORY_CARE_PROVIDER_SITE_OTHER): Payer: Medicare Other | Admitting: Family Medicine

## 2015-07-09 VITALS — BP 150/60 | HR 99 | Temp 97.4°F | Ht 72.0 in | Wt 248.8 lb

## 2015-07-09 DIAGNOSIS — M5416 Radiculopathy, lumbar region: Secondary | ICD-10-CM

## 2015-07-09 DIAGNOSIS — E291 Testicular hypofunction: Secondary | ICD-10-CM

## 2015-07-09 DIAGNOSIS — R37 Sexual dysfunction, unspecified: Secondary | ICD-10-CM

## 2015-07-09 DIAGNOSIS — M5417 Radiculopathy, lumbosacral region: Secondary | ICD-10-CM | POA: Diagnosis not present

## 2015-07-09 DIAGNOSIS — E349 Endocrine disorder, unspecified: Secondary | ICD-10-CM

## 2015-07-09 MED ORDER — TESTOSTERONE CYPIONATE 100 MG/ML IM SOLN
100.0000 mg | Freq: Once | INTRAMUSCULAR | Status: AC
Start: 2015-07-09 — End: 2015-07-09
  Administered 2015-07-09: 100 mg via INTRAMUSCULAR

## 2015-07-09 MED ORDER — GABAPENTIN 300 MG PO CAPS: 300.0000 mg | ORAL_CAPSULE | Freq: Two times a day (BID) | ORAL | Status: DC

## 2015-07-09 NOTE — Progress Notes (Signed)
Pre visit review using our clinic review tool, if applicable. No additional management support is needed unless otherwise documented below in the visit note. 

## 2015-07-09 NOTE — Patient Instructions (Signed)
Increase the gabapentin dose to once in the morning and once in the evening.   If not improving in 3 weeks, ok to increase to three times a day.

## 2015-07-09 NOTE — Progress Notes (Signed)
Dr. Frederico Hamman T. Pieper Kasik, MD, [facility] and Sports Medicine [place], [address] Phone: [phone] Fax: [phone]  07/09/2015  Patient: [name], [name]: [id number], DOB: 1946-02-09, 70 y.o.  Primary Physician:  [name], MD   Chief Complaint  Patient presents with  . Follow-up    6 week   Subjective:   [name] is a 70 y.o. very pleasant male patient who presents with the following: Back Pain  75% better on the back Taking the ibuprofen.  Radicular pain improved  Still with ED  Neurontin 300 mg, taking 1 at night.   R inner leg is raw some all the time. b  05/28/2015 Last OV with [name], MD  ongoing for approximately: 3-4 mo The patient has had back pain before. The back pain is localized into the lumbar spine area. They also describe R radiculopathy.  +Tingling.  No bowel or bladder incontinence. No focal weakness. Prior interventions: traction x 3 mo from prior trauma many years ago Physical therapy: No Chiropractic manipulations: No Acupuncture: No Osteopathic manipulation: No Heat or cold: Minimal effect  Hypogonadism and ED Lab Results  Component Value Date   TESTOSTERONE 365 06/05/2015     Past Medical History, Surgical History, Family History, Medications, Allergies have been reviewed and updated if relevant.  Patient Active Problem List   Diagnosis Date Noted  . Type 1 diabetes mellitus with neurological manifestations, uncontrolled ([place]) 08/05/2011    Priority: High  . Former very heavy cigarette smoker (more than 40 per day) 10/07/2014    Priority: Medium  . Chronic kidney disease, stage III (moderate) 12/19/2012    Priority: Medium  . OSA (obstructive sleep apnea) 03/09/2011    Priority: Medium  . Diabetic neuropathy ([place]) 12/29/2010    Priority: Medium  . HYPERLIPIDEMIA 06/13/2008    Priority: Medium  . Major depressive disorder, recurrent episode, in partial remission  ([place]) 06/13/2008    Priority: Medium  . HYPERTENSION 06/13/2008    Priority: Medium  . Erectile dysfunction associated with type 2 diabetes mellitus ([place]) 05/28/2015  . Hypogonadism male 06/14/2012  . Hemorrhoids with bleeding (L lateral pile especially) 09/27/2011  . Obesity (BMI 30-39.9) 09/27/2011  . History of smoking 09/16/2010  . Family history of MI (myocardial infarction) 09/16/2010  . HIP REPLACEMENT, RIGHT, HX OF 02/05/2010  . GOUT 06/13/2008  . ALLERGIC RHINITIS 06/13/2008  . GERD 06/13/2008  . OSTEOARTHRITIS 06/13/2008    Past Medical History  Diagnosis Date  . Depression   . Diabetes mellitus ([place])   . Hyperlipidemia   . Hypertension   . Osteoarthritis   . Allergic rhinitis due to pollen   . GERD (gastroesophageal reflux disease)   . Gout   . Diverticulosis   . Personal history of colonic polyps     1996  . Low testosterone   . Iron deficiency   . Hypogonadism male 06/14/2012  . Chronic kidney disease, stage III (moderate) 12/19/2012  . Former very heavy cigarette smoker (more than 40 per day) 10/07/2014  . Erectile dysfunction associated with type 2 diabetes mellitus ([place]) 05/28/2015    Past Surgical History  Procedure Laterality Date  . Cholecystectomy  1980  . Total hip arthroplasty  2009    Dr. Gladstone Lighter  . Total hip arthroplasty    . Cardiac catheterization  11/2011    [facility]    Social History   Social History  . Marital Status: Married    Spouse  Name: N/A  . Number of Children: N/A  . Years of Education: N/A   Occupational History  . Dispatcher   . retired Administrator    Social History Main Topics  . Smoking status: Former Smoker -- 3.00 packs/day for 35 years    Types: Cigarettes    Quit date: 04/12/2000  . Smokeless tobacco: Never Used  . Alcohol Use: No  . Drug Use: No  . Sexual Activity: Not on file   Other Topics Concern  . Not on file   Social History Narrative   No regular exercise   Caffeine use: yes, dt coke    Family  History  Problem Relation Age of Onset  . Diabetes Mother   . Diabetes Father   . Lung cancer Sister     lung  . Heart disease Maternal Grandfather   . COPD Sister   . Heart attack Sister   . Heart disease Sister     Allergies  Allergen Reactions  . Tetracycline Itching and Rash    Medication list reviewed and updated in full in [place].  GEN: No fevers, chills. Nontoxic. Primarily MSK c/o today. MSK: Detailed in the HPI GI: tolerating PO intake without difficulty Neuro: As above  Otherwise the pertinent positives of the ROS are noted above.    Objective:   Blood pressure 150/60, pulse 99, temperature 97.4 F (36.3 C), temperature source Oral, height 6' (1.829 m), weight 248 lb 12 oz (112.832 kg).  Gen: Well-developed,well-nourished,in no acute distress; alert,appropriate and cooperative throughout examination HEENT: Normocephalic and atraumatic without obvious abnormalities.  Ears, externally no deformities Pulm: Breathing comfortably in no respiratory distress Range of motion at  the waist: Flexion, rotation and lateral bending: approaching full  No echymosis or edema Rises to examination table with no difficulty Gait: minimally antalgic  Inspection/Deformity: No abnormality Paraspinus T:  l3-s1 b ttp  B Ankle Dorsiflexion (L5,4): 5/5 B Great Toe Dorsiflexion (L5,4): 5/5 Heel Walk (L5): WNL Toe Walk (S1): WNL Rise/Squat (L4): WNL, mild pain  SENSORY B Medial Foot (L4): WNL B Dorsum (L5): WNL B Lateral (S1): WNL Light Touch: WNL Pinprick: WNL  REFLEXES Knee (L4): 2+ Ankle (S1): 2+  B SLR, seated: neg B SLR, supine: neg B FABER: neg B Reverse FABER: neg B Greater Troch: NT B Log Roll: neg  Radiology: pending  Assessment and Plan:   Lumbar back pain with radiculopathy affecting right lower extremity  Sexual dysfunction  Testosterone deficiency  Doing better IM test today  Patient Instructions  Increase the gabapentin dose to  once in the morning and once in the evening.   If not improving in 3 weeks, ok to increase to three times a day.      He is not really having a good response from Viagra, and I talked about other options.  We also talked about possibly seeing urology and potentially doing injections, but is not really interested at that point.  Follow-up: for routine check-up  New Prescriptions   No medications on file   Modified Medications   Modified Medication Previous Medication   GABAPENTIN (NEURONTIN) 300 MG CAPSULE gabapentin (NEURONTIN) 300 MG capsule      Take 1 capsule (300 mg total) by mouth 2 (two) times daily.    Take 1 capsule (300 mg total) by mouth 3 (three) times daily.   No orders of the defined types were placed in this encounter.    Signed,  Maud Deed. Tadeusz Stahl, MD   Patient's  Medications  New Prescriptions   No medications on file  Previous Medications   ASPIRIN 81 MG CHEWABLE TABLET    Chew 81 mg by mouth at bedtime.    ATENOLOL (TENORMIN) 100 MG TABLET    TAKE ONE TABLET BY MOUTH ONCE DAILY   B COMPLEX VITAMINS TABLET    Take 1 tablet by mouth daily.     CHOLECALCIFEROL (VITAMIN D) 1000 UNITS TABLET    Take 1,000 Units by mouth daily.     DOXAZOSIN (CARDURA) 8 MG TABLET    Take 1 tablet (8 mg total) by mouth at bedtime.   FAMOTIDINE (PEPCID) 10 MG TABLET    Take 10 mg by mouth daily.     FLUTICASONE (FLONASE) 50 MCG/ACT NASAL SPRAY    INHALE 2 SPRAYS INTO EACH NOSTRIL EVERY DAY AS NEEDED   HYDROCODONE-ACETAMINOPHEN (NORCO/VICODIN) 5-325 MG TABLET    Take 1 tablet by mouth every 6 (six) hours as needed for moderate pain.   INSULIN NPH HUMAN (HUMULIN N,NOVOLIN N) 100 UNIT/ML INJECTION    Inject 0.5 mLs (50 Units total) into the skin 3 (three) times daily.   INSULIN REGULAR (NOVOLIN R,HUMULIN R) 100 UNITS/ML INJECTION    Inject up to 150 units a day as advised   LISINOPRIL-HYDROCHLOROTHIAZIDE (PRINZIDE,ZESTORETIC) 20-12.5 MG PER TABLET    TAKE ONE TABLET BY MOUTH ONCE DAILY     MELATONIN 3 MG TABS    Take 1 tablet by mouth at bedtime as needed.   NYSTATIN CREAM (MYCOSTATIN)    Apply topically 2 (two) times daily as needed.   SILDENAFIL (REVATIO) 20 MG TABLET    Generic Revatio / Sildanefil 20 mg. 2 - 5 tabs 30 mins prior to intercourse.   SIMVASTATIN (ZOCOR) 40 MG TABLET    TAKE 1 TABLET BY MOUTH AT BEDTIME *NEED OFFICE VISIT   TAMSULOSIN (FLOMAX) 0.4 MG CAPS CAPSULE    Take 1 capsule (0.4 mg total) by mouth daily.   TESTOSTERONE CYPIONATE (DEPOTESTOTERONE CYPIONATE) 100 MG/ML INJECTION    Inject 100 mg into the muscle every 14 (fourteen) days. For IM use only   VENLAFAXINE XR (EFFEXOR-XR) 75 MG 24 HR CAPSULE    TAKE THREE (3) CAPSULES BY MOUTH DAILY  Modified Medications   Modified Medication Previous Medication   GABAPENTIN (NEURONTIN) 300 MG CAPSULE gabapentin (NEURONTIN) 300 MG capsule      Take 1 capsule (300 mg total) by mouth 2 (two) times daily.    Take 1 capsule (300 mg total) by mouth 3 (three) times daily.  Discontinued Medications   No medications on file

## 2015-07-10 ENCOUNTER — Ambulatory Visit: Payer: Medicare Other

## 2015-07-24 ENCOUNTER — Other Ambulatory Visit: Payer: Self-pay | Admitting: Family Medicine

## 2015-07-24 ENCOUNTER — Ambulatory Visit (INDEPENDENT_AMBULATORY_CARE_PROVIDER_SITE_OTHER): Payer: Medicare Other

## 2015-07-24 DIAGNOSIS — R37 Sexual dysfunction, unspecified: Secondary | ICD-10-CM

## 2015-07-24 MED ORDER — TESTOSTERONE CYPIONATE 100 MG/ML IM SOLN
100.0000 mg | Freq: Once | INTRAMUSCULAR | Status: AC
  Administered 2015-07-24: 100 mg via INTRAMUSCULAR

## 2015-07-24 NOTE — Telephone Encounter (Signed)
Last office visit 07/09/2015.  Rx printed and faxed to [facility] [phone].

## 2015-08-06 ENCOUNTER — Ambulatory Visit (INDEPENDENT_AMBULATORY_CARE_PROVIDER_SITE_OTHER): Payer: Medicare Other | Admitting: Pulmonary Disease

## 2015-08-06 ENCOUNTER — Encounter: Payer: Self-pay | Admitting: Pulmonary Disease

## 2015-08-06 VITALS — BP 116/62 | HR 97 | Ht 72.0 in | Wt 244.0 lb

## 2015-08-06 DIAGNOSIS — G4733 Obstructive sleep apnea (adult) (pediatric): Secondary | ICD-10-CM | POA: Diagnosis not present

## 2015-08-06 DIAGNOSIS — Z9989 Dependence on other enabling machines and devices: Principal | ICD-10-CM

## 2015-08-06 NOTE — Patient Instructions (Signed)
Will call with results of CPAP download  Follow up in 1 year 

## 2015-08-06 NOTE — Progress Notes (Signed)
Current Outpatient Prescriptions on File Prior to Visit  Medication Sig  . aspirin 81 MG chewable tablet Chew 81 mg by mouth at bedtime.   [name] Kitchen atenolol (TENORMIN) 100 MG tablet TAKE ONE TABLET BY MOUTH ONCE DAILY  . b complex vitamins tablet Take 1 tablet by mouth daily.    . cholecalciferol (VITAMIN D) 1000 UNITS tablet Take 1,000 Units by mouth daily.    [name] Kitchen DEPO-TESTOSTERONE 100 MG/ML injection INJECT 1 ML INTRAMUSCULARLY EVERY 2 WEEKS  . doxazosin (CARDURA) 8 MG tablet Take 1 tablet (8 mg total) by mouth at bedtime.  . famotidine (PEPCID) 10 MG tablet Take 10 mg by mouth daily.    . fluticasone (FLONASE) 50 MCG/ACT nasal spray INHALE 2 SPRAYS INTO EACH NOSTRIL EVERY DAY AS NEEDED  . gabapentin (NEURONTIN) 300 MG capsule Take 1 capsule (300 mg total) by mouth 2 (two) times daily.  [name] Kitchen HYDROcodone-acetaminophen (NORCO/VICODIN) 5-325 MG tablet Take 1 tablet by mouth every 6 (six) hours as needed for moderate pain.  [name] Kitchen insulin NPH Human (HUMULIN N,NOVOLIN N) 100 UNIT/ML injection Inject 0.5 mLs (50 Units total) into the skin 3 (three) times daily.  . insulin regular (NOVOLIN R,HUMULIN R) 100 units/mL injection Inject up to 150 units a day as advised  . lisinopril-hydrochlorothiazide (PRINZIDE,ZESTORETIC) 20-12.5 MG per tablet TAKE ONE TABLET BY MOUTH ONCE DAILY  . Melatonin 3 MG TABS Take 1 tablet by mouth at bedtime as needed.  . sildenafil (REVATIO) 20 MG tablet Generic Revatio / Sildanefil 20 mg. 2 - 5 tabs 30 mins prior to intercourse.  . simvastatin (ZOCOR) 40 MG tablet TAKE 1 TABLET BY MOUTH AT BEDTIME *NEED OFFICE VISIT  . tamsulosin (FLOMAX) 0.4 MG CAPS capsule Take 1 capsule (0.4 mg total) by mouth daily.  [name] Kitchen venlafaxine XR (EFFEXOR-XR) 75 MG 24 hr capsule TAKE THREE (3) CAPSULES BY MOUTH DAILY   No current facility-administered medications on file prior to visit.     Chief Complaint  Patient presents with  . Follow-up    Pt needs a new SD card. Wears CPAP nightly. Denies issues with  pressure. Needs new mask, hosing, headgear and filters. DME: [facility]     Tests PSG 01/12/11 >> AHI 27 Echo 12/24/14 >> EF 60 to 65%  Past medical hx Depression, DM, HLD, HTN, GERD, Gout, Diverticulosis, Low T, CKD 3, ED  Past surgical hx, Allergies, Family hx, Social hx all reviewed.  Vital Signs BP 116/62 mmHg  Pulse 97  Ht 6' (1.829 m)  Wt 244 lb (110.678 kg)  BMI 33.09 kg/m2  SpO2 96%  History of Present Illness [name] is a 70 y.o. male with obstructive sleep apnea.  He has been doing well with CPAP.  He has nasal mask >> no issue with mask fit.  He goes to bed at 2 am and wakes up at 9 am.  He feels rested during the day.  He takes melatonin about 30 minutes before going to bed. His energy level has improved since he was started on testosterone supplements.  Physical Exam  General - No distress ENT - No sinus tenderness, no oral exudate, no LAN, MP 4, wears dentures Cardiac - s1s2 regular, no murmur Chest - No wheeze/rales/dullness Back - No focal tenderness Abd - Soft, non-tender Ext - No edema Neuro - Normal strength Skin - No rashes Psych - normal mood, and behavior   Assessment/Plan  Obstructive sleep apnea. - will continue CPAP 9 cm H2O - will arrange for new SD card  and get copy of his download   Patient Instructions  Will call with results of CPAP download  Follow up in 1 year     Chesley Mires, MD [facility] Pager:  [phone] 08/06/2015, 4:47 PM

## 2015-08-07 ENCOUNTER — Ambulatory Visit (INDEPENDENT_AMBULATORY_CARE_PROVIDER_SITE_OTHER): Payer: Medicare Other | Admitting: *Deleted

## 2015-08-07 DIAGNOSIS — E349 Endocrine disorder, unspecified: Secondary | ICD-10-CM

## 2015-08-07 DIAGNOSIS — E291 Testicular hypofunction: Secondary | ICD-10-CM

## 2015-08-07 MED ORDER — TESTOSTERONE CYPIONATE 200 MG/ML IM SOLN
200.0000 mg | Freq: Once | INTRAMUSCULAR | Status: AC
  Administered 2015-08-07: 200 mg via INTRAMUSCULAR

## 2015-08-21 ENCOUNTER — Ambulatory Visit (INDEPENDENT_AMBULATORY_CARE_PROVIDER_SITE_OTHER): Payer: Medicare Other | Admitting: *Deleted

## 2015-08-21 DIAGNOSIS — E291 Testicular hypofunction: Secondary | ICD-10-CM | POA: Diagnosis not present

## 2015-08-21 MED ORDER — TESTOSTERONE CYPIONATE 200 MG/ML IM SOLN
200.0000 mg | Freq: Once | INTRAMUSCULAR | Status: AC
  Administered 2015-08-21: 200 mg via INTRAMUSCULAR

## 2015-08-22 ENCOUNTER — Other Ambulatory Visit: Payer: Self-pay | Admitting: Family Medicine

## 2015-08-26 ENCOUNTER — Ambulatory Visit (INDEPENDENT_AMBULATORY_CARE_PROVIDER_SITE_OTHER): Payer: Medicare Other

## 2015-08-26 ENCOUNTER — Ambulatory Visit: Payer: Medicare Other

## 2015-08-26 VITALS — BP 114/60 | HR 73 | Temp 97.6°F | Ht 71.0 in | Wt 251.0 lb

## 2015-08-26 DIAGNOSIS — Z0000 Encounter for general adult medical examination without abnormal findings: Secondary | ICD-10-CM | POA: Diagnosis not present

## 2015-08-26 DIAGNOSIS — Z1159 Encounter for screening for other viral diseases: Secondary | ICD-10-CM

## 2015-08-26 DIAGNOSIS — Z23 Encounter for immunization: Secondary | ICD-10-CM

## 2015-08-26 NOTE — Progress Notes (Signed)
Pre visit review using our clinic review tool, if applicable. No additional management support is needed unless otherwise documented below in the visit note. 

## 2015-08-26 NOTE — Patient Instructions (Addendum)
Mr. Allsbrook , Thank you for taking time to come for your Medicare Wellness Visit. I appreciate your ongoing commitment to your health goals. Please review the following plan we discussed and let me know if I can assist you in the future.   These are the goals we discussed: Goals    . Eat more fruits and vegetables     Starting 08/26/2015, I will continue to eat at least 3 servings of fresh fruits and vegetables. I will also attempt to do at least 15 min of chair exercises daily.        This is a list of the screening recommended for you and due dates:  Health Maintenance  Topic Date Due  . Shingles Vaccine  08/25/2016*  . Eye exam for diabetics  10/01/2015  . Flu Shot  11/11/2015  . Hemoglobin A1C  12/03/2015  . Complete foot exam   03/26/2016  . Tetanus Vaccine  04/12/2017  . Colon Cancer Screening  09/15/2020  .  Hepatitis C: One time screening is recommended by Center for Disease Control  (CDC) for  adults born from [birth year] through 1965.   Completed  . Pneumonia vaccines  Completed  *Topic was postponed. The date shown is not the original due date.     Preventive Care for Adults  A healthy lifestyle and preventive care can promote health and wellness. Preventive health guidelines for adults include the following key practices.  . A routine yearly physical is a good way to check with your health care provider about your health and preventive screening. It is a chance to share any concerns and updates on your health and to receive a thorough exam.  . Visit your dentist for a routine exam and preventive care every 6 months. Brush your teeth twice a day and floss once a day. Good oral hygiene prevents tooth decay and gum disease.  . The frequency of eye exams is based on your age, health, family medical history, use  of contact lenses, and other factors. Follow your health care provider's ecommendations for frequency of eye exams.  . Eat a healthy diet. Foods like vegetables,  fruits, whole grains, low-fat dairy products, and lean protein foods contain the nutrients you need without too many calories. Decrease your intake of foods high in solid fats, added sugars, and salt. Eat the right amount of calories for you. Get information about a proper diet from your health care provider, if necessary.  . Regular physical exercise is one of the most important things you can do for your health. Most adults should get at least 150 minutes of moderate-intensity exercise (any activity that increases your heart rate and causes you to sweat) each week. In addition, most adults need muscle-strengthening exercises on 2 or more days a week.  Silver Sneakers may be a benefit available to you. To determine eligibility, you may visit the website: www.silversneakers.com or contact program at 440-065-8259 Mon-Fri between 8AM-8PM.   . Maintain a healthy weight. The body mass index (BMI) is a screening tool to identify possible weight problems. It provides an estimate of body fat based on height and weight. Your health care provider can find your BMI and can help you achieve or maintain a healthy weight.   For adults 20 years and older: ? A BMI below 18.5 is considered underweight. ? A BMI of 18.5 to 24.9 is normal. ? A BMI of 25 to 29.9 is considered overweight. ? A BMI of 30 and above is considered  obese.   . Maintain normal blood lipids and cholesterol levels by exercising and minimizing your intake of saturated fat. Eat a balanced diet with plenty of fruit and vegetables. Blood tests for lipids and cholesterol should begin at age 47 and be repeated every 5 years. If your lipid or cholesterol levels are high, you are over 50, or you are at high risk for heart disease, you may need your cholesterol levels checked more frequently. Ongoing high lipid and cholesterol levels should be treated with medicines if diet and exercise are not working.  . If you smoke, find out from your health care  provider how to quit. If you do not use tobacco, please do not start.  . If you choose to drink alcohol, please do not consume more than 2 drinks per day. One drink is considered to be 12 ounces (355 mL) of beer, 5 ounces (148 mL) of wine, or 1.5 ounces (44 mL) of liquor.  . If you are 91-35 years old, ask your health care provider if you should take aspirin to prevent strokes.  . Use sunscreen. Apply sunscreen liberally and repeatedly throughout the day. You should seek shade when your shadow is shorter than you. Protect yourself by wearing long sleeves, pants, a wide-brimmed hat, and sunglasses year round, whenever you are outdoors.  . Once a month, do a whole body skin exam, using a mirror to look at the skin on your back. Tell your health care provider of new moles, moles that have irregular borders, moles that are larger than a pencil eraser, or moles that have changed in shape or color.

## 2015-08-27 ENCOUNTER — Telehealth: Payer: Self-pay | Admitting: Family Medicine

## 2015-08-27 LAB — HEPATITIS C ANTIBODY: HCV Ab: NEGATIVE

## 2015-08-27 NOTE — Telephone Encounter (Signed)
I spoke with the patient. He had received inadvertantly 200 mg of depo-testosterone on his last 2 injections rather than 100 mg.   He was given 200 mg/mL medication rather than the 100 mg/mL medication that we sent via fax to his pharmacy.   Based on his prior levels, no significant harm should have been done, and all are aware of this error and that he should be getting 100 mg per my prior orders.

## 2015-09-01 NOTE — Progress Notes (Signed)
PCP notes:  Health maintenance:   Hep C screening - completed PPSV23 - completed Shingles - postponed/insurance  Abnormal screenings:  Depression - positive/PHQ-2 completed. pls assess pt at next appt.  Fall risk - hx of fall within previous 12 months  Patient concerns: Pt desires to get testosterone injection.  Nurse concerns: None  Next PCP appt: 09/04/15 @ 1130AM

## 2015-09-01 NOTE — Progress Notes (Signed)
Subjective:   [name] is a 70 y.o. male who presents for Medicare Annual/Subsequent preventive examination.  Review of Systems:  N/A Cardiac Risk Factors include: advanced age (>60mn, >>30women);diabetes mellitus;obesity (BMI >30kg/m2);sedentary lifestyle;male gender;hypertension;dyslipidemia     Objective:    Vitals: BP 114/60 mmHg  Pulse 73  Temp(Src) 97.6 F (36.4 C) (Oral)  Ht '5\' 11"'$  (1.803 m)  Wt 251 lb (113.853 kg)  BMI 35.02 kg/m2  SpO2 94%  Body mass index is 35.02 kg/(m^2).  Tobacco History  Smoking status  . Former Smoker -- 3.00 packs/day for 35 years  . Types: Cigarettes  . Quit date: 04/12/2000  Smokeless tobacco  . Never Used     Counseling given: No   Past Medical History  Diagnosis Date  . Depression   . Diabetes mellitus ([place]   . Hyperlipidemia   . Hypertension   . Osteoarthritis   . Allergic rhinitis due to pollen   . GERD (gastroesophageal reflux disease)   . Gout   . Diverticulosis   . Personal history of colonic polyps     1996  . Low testosterone   . Iron deficiency   . Hypogonadism male 06/14/2012  . Chronic kidney disease, stage III (moderate) 12/19/2012  . Former very heavy cigarette smoker (more than 40 per day) 10/07/2014  . Erectile dysfunction associated with type 2 diabetes mellitus ([place] 05/28/2015   Past Surgical History  Procedure Laterality Date  . Cholecystectomy  1980  . Total hip arthroplasty  2009    Dr. GGladstone Lighter . Total hip arthroplasty    . [facility]  Family History  Problem Relation Age of Onset  . Diabetes Mother   . Diabetes Father   . Lung cancer Sister     lung  . Heart disease Maternal Grandfather   . COPD Sister   . Heart attack Sister   . Heart disease Sister    History  Sexual Activity  . Sexual Activity: No    Outpatient Encounter Prescriptions as of 08/26/2015  Medication Sig  . aspirin 81 MG chewable tablet Chew 81 mg by mouth at bedtime.   .[name] Kitchen atenolol (TENORMIN) 100 MG tablet TAKE ONE TABLET BY MOUTH ONCE DAILY.  .[name] Kitchenb complex vitamins tablet Take 1 tablet by mouth daily.    . cholecalciferol (VITAMIN D) 1000 UNITS tablet Take 1,000 Units by mouth daily.    .[name] KitchenDEPO-TESTOSTERONE 100 MG/ML injection INJECT 1 ML INTRAMUSCULARLY EVERY 2 WEEKS  . doxazosin (CARDURA) 8 MG tablet Take 1 tablet (8 mg total) by mouth at bedtime.  . famotidine (PEPCID) 10 MG tablet Take 10 mg by mouth daily.    . fluticasone (FLONASE) 50 MCG/ACT nasal spray INHALE 2 SPRAYS INTO EACH NOSTRIL EVERY DAY AS NEEDED  . gabapentin (NEURONTIN) 300 MG capsule Take 1 capsule (300 mg total) by mouth 2 (two) times daily.  .[name] KitchenHYDROcodone-acetaminophen (NORCO/VICODIN) 5-325 MG tablet Take 1 tablet by mouth every 6 (six) hours as needed for moderate pain.  .[name] Kitcheninsulin NPH Human (HUMULIN N,NOVOLIN N) 100 UNIT/ML injection Inject 0.5 mLs (50 Units total) into the skin 3 (three) times daily.  . insulin regular (NOVOLIN R,HUMULIN R) 100 units/mL injection Inject up to 150 units a day as advised  . lisinopril-hydrochlorothiazide (PRINZIDE,ZESTORETIC) 20-12.5 MG tablet TAKE ONE TABLET BY MOUTH ONCE DAILY.  . Melatonin 3 MG TABS Take 1 tablet by mouth at bedtime as needed.  . sildenafil (REVATIO) 20  MG tablet Generic Revatio / Sildanefil 20 mg. 2 - 5 tabs 30 mins prior to intercourse.  . simvastatin (ZOCOR) 40 MG tablet TAKE 1 TABLET BY MOUTH AT BEDTIME *NEED OFFICE VISIT  . tamsulosin (FLOMAX) 0.4 MG CAPS capsule Take 1 capsule (0.4 mg total) by mouth daily.  [name] Kitchen venlafaxine XR (EFFEXOR-XR) 75 MG 24 hr capsule TAKE THREE (3) CAPSULES BY MOUTH DAILY   No facility-administered encounter medications on file as of 08/26/2015.    Activities of Daily Living In your present state of health, do you have any difficulty performing the following activities: 08/26/2015  Hearing? Y  Vision? N  Difficulty concentrating or making decisions? Y  Walking or climbing stairs? N  Dressing or bathing? N    Doing errands, shopping? N  Preparing Food and eating ? N  Using the Toilet? N  In the past six months, have you accidently leaked urine? Y  Do you have problems with loss of bowel control? Y  Managing your Medications? N  Managing your Finances? N  Housekeeping or managing your Housekeeping? N    Patient Care Team: [name], MD as PCP - General Gatha Mayer, MD as Consulting Physician (Gastroenterology) Philemon Kingdom, MD as Consulting Physician (Internal Medicine)   Assessment:     Hearing Screening   '125Hz'$  '250Hz'$  '500Hz'$  '1000Hz'$  '2000Hz'$  '4000Hz'$  '8000Hz'$   Right ear:   0 0 40 0   Left ear:   40 40 40 0   Vision Screening Comments: Last eye exam in June 2016   Exercise Activities and Dietary recommendations Current Exercise Habits: The patient does not participate in regular exercise at present, Exercise limited by: None identified  Goals    . Eat more fruits and vegetables     Starting 08/26/2015, I will continue to eat at least 3 servings of fresh fruits and vegetables. I will also attempt to do at least 15 min of chair exercises daily.       Fall Risk Fall Risk  08/26/2015 12/19/2013 12/18/2012  Falls in the past year? Yes Yes No  Number falls in past yr: 1 1 -  Injury with Fall? No No -  Follow up Falls evaluation completed - -   Depression Screen PHQ 2/9 Scores 08/26/2015 12/19/2013 12/18/2012  PHQ - 2 Score 2 0 1    Cognitive Testing MMSE - Mini Mental State Exam 08/26/2015  Orientation to time 5  Orientation to Place 5  Registration 3  Attention/ Calculation 0  Recall 3  Language- name 2 objects 0  Language- repeat 1  Language- follow 3 step command 3  Language- read & follow direction 0  Write a sentence 0  Copy design 0  Total score 20   PLEASE NOTE: A Mini-Cog screen was completed. Maximum score is 20. A value of 0 denotes this part of [place] MMSE was not completed or the patient failed this part of the Mini-Cog screening.   Mini-Cog  Screening Orientation to Time - Max 5 pts Orientation to Place - Max 5 pts Registration - Max 3 pts Recall - Max 3 pts Language Repeat - Max 1 pts Language Follow 3 Step Command - Max 3 pts  Immunization History  Administered Date(s) Administered  . Influenza Split 12/29/2010  . Influenza Whole 01/27/2009, 02/05/2010  . Influenza, Seasonal, Injecte, Preservative Fre 06/14/2012  . Influenza,inj,Quad PF,36+ Mos 12/18/2012, 12/19/2013, 03/19/2015  . Pneumococcal Conjugate-13 12/19/2013  . Pneumococcal Polysaccharide-23 10/25/2008, 08/26/2015  . Td 04/13/2007   Screening Tests Health  Maintenance  Topic Date Due  . ZOSTAVAX  08/25/2016 (Originally 11/06/2005)  . OPHTHALMOLOGY EXAM  10/01/2015  . INFLUENZA VACCINE  11/11/2015  . HEMOGLOBIN A1C  12/03/2015  . FOOT EXAM  03/26/2016  . TETANUS/TDAP  04/12/2017  . COLONOSCOPY  09/15/2020  . Hepatitis C Screening  Completed  . PNA vac Low Risk Adult  Completed      Plan:     I have personally reviewed and addressed the Medicare Annual Wellness questionnaire and have noted the following in the patient's chart:  A. Medical and social history B. Use of alcohol, tobacco or illicit drugs  C. Current medications and supplements D. Functional ability and status E.  Nutritional status F.  Physical activity G. Advance directives H. List of other physicians I.  Hospitalizations, surgeries, and ER visits in previous 12 months J.  [place] to include hearing, vision, cognitive, depression L. Referrals and appointments - none  In addition, I have reviewed and discussed with patient certain preventive protocols, quality metrics, and best practice recommendations. A written personalized care plan for preventive services as well as general preventive health recommendations were provided to patient.  See attached scanned questionnaire for additional information.   Signed,   Lindell Noe, MHA, BS, LPN Health Advisor

## 2015-09-02 NOTE — Progress Notes (Signed)
I reviewed health advisor's note, was available for consultation, and agree with documentation and plan.  Teara Duerksen, MD [facility] [place] [place]  

## 2015-09-04 ENCOUNTER — Ambulatory Visit: Payer: Medicare Other

## 2015-09-04 ENCOUNTER — Ambulatory Visit (INDEPENDENT_AMBULATORY_CARE_PROVIDER_SITE_OTHER): Payer: Medicare Other | Admitting: Family Medicine

## 2015-09-04 VITALS — BP 170/60 | HR 89 | Temp 97.9°F | Ht 71.0 in | Wt 249.2 lb

## 2015-09-04 DIAGNOSIS — E1040 Type 1 diabetes mellitus with diabetic neuropathy, unspecified: Secondary | ICD-10-CM | POA: Diagnosis not present

## 2015-09-04 DIAGNOSIS — N183 Chronic kidney disease, stage 3 unspecified: Secondary | ICD-10-CM

## 2015-09-04 DIAGNOSIS — N521 Erectile dysfunction due to diseases classified elsewhere: Secondary | ICD-10-CM

## 2015-09-04 DIAGNOSIS — IMO0002 Reserved for concepts with insufficient information to code with codable children: Secondary | ICD-10-CM

## 2015-09-04 DIAGNOSIS — E291 Testicular hypofunction: Secondary | ICD-10-CM

## 2015-09-04 DIAGNOSIS — E1065 Type 1 diabetes mellitus with hyperglycemia: Secondary | ICD-10-CM

## 2015-09-04 DIAGNOSIS — I10 Essential (primary) hypertension: Secondary | ICD-10-CM | POA: Diagnosis not present

## 2015-09-04 DIAGNOSIS — E0842 Diabetes mellitus due to underlying condition with diabetic polyneuropathy: Secondary | ICD-10-CM

## 2015-09-04 DIAGNOSIS — E785 Hyperlipidemia, unspecified: Secondary | ICD-10-CM

## 2015-09-04 DIAGNOSIS — E1169 Type 2 diabetes mellitus with other specified complication: Secondary | ICD-10-CM

## 2015-09-04 MED ORDER — TESTOSTERONE CYPIONATE 200 MG/ML IM SOLN: 150.0000 mg | Freq: Once | INTRAMUSCULAR | Status: DC

## 2015-09-04 MED ORDER — TESTOSTERONE CYPIONATE 200 MG/ML IM SOLN
150.0000 mg | Freq: Once | INTRAMUSCULAR | Status: AC
  Administered 2015-09-04: 150 mg via INTRAMUSCULAR

## 2015-09-04 NOTE — Progress Notes (Signed)
Pre visit review using our clinic review tool, if applicable. No additional management support is needed unless otherwise documented below in the visit note. 

## 2015-09-04 NOTE — Progress Notes (Signed)
Dr. Frederico Hamman T. Madoline Bhatt, MD, [facility] [place], [address] Phone: [phone] Fax: [phone]  09/04/2015  Patient: [name], [name]: [id number], DOB: 1945-08-11, 70 y.o.  Primary Physician:  [name], MD   Chief Complaint  Patient presents with  . Medicare Wellness    Part 2   Subjective:   [name] is a 70 y.o. very pleasant male patient who presents with the following:  [name] is here in follow-up after his Medicare wellness exam.  Follow-up multiple medical problems.  He forgot to take his blood pressure medication today, and his blood pressure is elevated.  In the past he has been intermittently noncompliant.  Diabetes, he generally sees endocrinology, but he has not been able to see them in more than and 6 months.  We checked an A1c today.  Lab Results  Component Value Date   HGBA1C 9.4* 06/05/2015    This is elevated, however it is better than some of his more recent numbers.  He gets a hypoglycemic sensation even when he has normal blood sugars given that they're elevated most of the time.  He inadvertently was given 200 mg of Depo-testosterone at his last injection, which we [place] knew about, and I spoke with him about this on the phone previously.  On side note, he did note that his energy improved quite a bit, and he had better libido  And erectile quality after that dose. Increase Testosterone to 150 mg per injection every 2 weeks.   Body mass index is 34.78 kg/(m^2).   Past Medical History, Surgical History, Social History, Family History, Problem List, Medications, and Allergies have been reviewed and updated if relevant.  Patient Active Problem List   Diagnosis Date Noted  . Type 1 diabetes mellitus with neurological manifestations, uncontrolled ([place]) 08/05/2011    Priority: High  . Former very heavy cigarette smoker (more than 40 per day) 10/07/2014    Priority: Medium  .  Chronic kidney disease, stage III (moderate) 12/19/2012    Priority: Medium  . OSA (obstructive sleep apnea) 03/09/2011    Priority: Medium  . Diabetic neuropathy ([place]) 12/29/2010    Priority: Medium  . HYPERLIPIDEMIA 06/13/2008    Priority: Medium  . Major depressive disorder, recurrent episode, in partial remission ([place]) 06/13/2008    Priority: Medium  . HYPERTENSION 06/13/2008    Priority: Medium  . Erectile dysfunction associated with type 2 diabetes mellitus ([place]) 05/28/2015  . Hypogonadism male 06/14/2012  . Hemorrhoids with bleeding (L lateral pile especially) 09/27/2011  . Obesity (BMI 30-39.9) 09/27/2011  . History of smoking 09/16/2010  . Family history of MI (myocardial infarction) 09/16/2010  . HIP REPLACEMENT, RIGHT, HX OF 02/05/2010  . GOUT 06/13/2008  . ALLERGIC RHINITIS 06/13/2008  . GERD 06/13/2008  . OSTEOARTHRITIS 06/13/2008    Past Medical History  Diagnosis Date  . Depression   . Diabetes mellitus ([name])   . Hyperlipidemia   . Hypertension   . Osteoarthritis   . Allergic rhinitis due to pollen   . GERD (gastroesophageal reflux disease)   . Gout   . Diverticulosis   . Personal history of colonic polyps     1996  . Low testosterone   . Iron deficiency   . Hypogonadism male 06/14/2012  . Chronic kidney disease, stage III (moderate) 12/19/2012  . Former very heavy cigarette smoker (more than 40 per day) 10/07/2014  . Erectile dysfunction associated with type 2  diabetes mellitus ([place]) 05/28/2015    Past Surgical History  Procedure Laterality Date  . Cholecystectomy  1980  . Total hip arthroplasty  2009    Dr. Gladstone Lighter  . Total hip arthroplasty    . [facility]  11/2011    [facility]    Social History   Social History  . Marital Status: Married    Spouse Name: N/A  . Number of Children: N/A  . Years of Education: N/A   Occupational History  . Dispatcher   . retired Administrator    Social History Main Topics  . Smoking status: Former  Smoker -- 3.00 packs/day for 35 years    Types: Cigarettes    Quit date: 04/12/2000  . Smokeless tobacco: Never Used  . Alcohol Use: No  . Drug Use: No  . Sexual Activity: No   Other Topics Concern  . Not on file   Social History Narrative   No regular exercise   Caffeine use: yes, dt coke    Family History  Problem Relation Age of Onset  . Diabetes Mother   . Diabetes Father   . Lung cancer Sister     lung  . Heart disease Maternal Grandfather   . COPD Sister   . Heart attack Sister   . Heart disease Sister     Allergies  Allergen Reactions  . Tetracycline Itching and Rash    Medication list reviewed and updated in full in [place].   GEN: No acute illnesses, no fevers, chills. GI: No n/v/d, eating normally Pulm: No SOB Interactive and getting along well at home.  Otherwise, ROS is as per the HPI.  Objective:   BP 170/60 mmHg  Pulse 89  Temp(Src) 97.9 F (36.6 C) (Oral)  Ht '5\' 11"'$  (1.803 m)  Wt 249 lb 4 oz (113.059 kg)  BMI 34.78 kg/m2  GEN: WDWN, NAD, Non-toxic, A & O x 3 HEENT: Atraumatic, Normocephalic. Neck supple. No masses, No LAD. Ears and Nose: No external deformity. CV: RRR, No M/G/R. No JVD. No thrill. No extra heart sounds. PULM: CTA B, no wheezes, crackles, rhonchi. No retractions. No resp. distress. No accessory muscle use. EXTR: No c/c/e NEURO Normal gait.  PSYCH: Normally interactive. Conversant. Not depressed or anxious appearing.  Calm demeanor.   Laboratory and Imaging Data: Results for orders placed or performed in visit on 08/26/15  Hepatitis C Antibody  Result Value Ref Range   HCV Ab NEGATIVE NEGATIVE    Lab Results  Component Value Date   WBC 11.5* 12/05/2014   HGB 15.0 12/05/2014   HCT 44.0 12/05/2014   PLT 164 12/05/2014   GLUCOSE 105* 12/05/2014   CHOL 154 06/05/2015   TRIG * 06/05/2015    503.0 Triglyceride is over 400; calculations on Lipids are invalid.   HDL 30.60* 06/05/2015   LDLDIRECT 73.0  06/05/2015   LDLCALC 43 04/24/2009   ALT 26 12/05/2014   AST 30 12/05/2014   NA 136 12/05/2014   K 4.2 12/05/2014   CL 97* 12/05/2014   CREATININE 1.30* 12/05/2014   BUN 22* 12/05/2014   CO2 29 12/05/2014   TSH 1.31 10/18/2014   PSA 0.38 12/12/2013   INR 1.02 12/05/2014   HGBA1C 9.4* 06/05/2015   MICROALBUR 4.0* 12/12/2013     Assessment and Plan:   Hypogonadism male - Plan: testosterone cypionate (DEPOTESTOSTERONE CYPIONATE) injection 150 mg  Essential hypertension  Uncontrolled type 1 diabetes with diabetic neuropathy ([place])  Chronic kidney disease, stage III (  moderate)  Diabetic polyneuropathy associated with diabetes mellitus due to underlying condition ([facility])  Hyperlipidemia LDL goal <70  Erectile dysfunction associated with type 2 diabetes mellitus ([place])  Multiple issues.  Reminded to take blood pressure medication daily.  Diabetes is still under poor control.  Reminded to follow-up with endocrinology.  Hypogonadism, we are going to increase his dose to 150 mg IM every 2 weeks.  Based on his prior numbers, he should be safe to do this.  Follow-up: 6 mo  New Prescriptions   TESTOSTERONE CYPIONATE (DEPOTESTOSTERONE CYPIONATE) 200 MG/ML INJECTION    Inject 0.75 mLs (150 mg total) into the muscle once.   Signed,  Maud Deed. Brice Kossman, MD   Patient's Medications  New Prescriptions   TESTOSTERONE CYPIONATE (DEPOTESTOSTERONE CYPIONATE) 200 MG/ML INJECTION    Inject 0.75 mLs (150 mg total) into the muscle once.  Previous Medications   ASPIRIN 81 MG CHEWABLE TABLET    Chew 81 mg by mouth at bedtime.    ATENOLOL (TENORMIN) 100 MG TABLET    TAKE ONE TABLET BY MOUTH ONCE DAILY.   B COMPLEX VITAMINS TABLET    Take 1 tablet by mouth daily.     CHOLECALCIFEROL (VITAMIN D) 1000 UNITS TABLET    Take 1,000 Units by mouth daily.     DOXAZOSIN (CARDURA) 8 MG TABLET    Take 1 tablet (8 mg total) by mouth at bedtime.   FAMOTIDINE (PEPCID) 10 MG TABLET    Take 10 mg by mouth  daily.     FLUTICASONE (FLONASE) 50 MCG/ACT NASAL SPRAY    INHALE 2 SPRAYS INTO EACH NOSTRIL EVERY DAY AS NEEDED   GABAPENTIN (NEURONTIN) 300 MG CAPSULE    Take 1 capsule (300 mg total) by mouth 2 (two) times daily.   HYDROCODONE-ACETAMINOPHEN (NORCO/VICODIN) 5-325 MG TABLET    Take 1 tablet by mouth every 6 (six) hours as needed for moderate pain.   INSULIN NPH HUMAN (HUMULIN N,NOVOLIN N) 100 UNIT/ML INJECTION    Inject 0.5 mLs (50 Units total) into the skin 3 (three) times daily.   INSULIN REGULAR (NOVOLIN R,HUMULIN R) 100 UNITS/ML INJECTION    Inject up to 150 units a day as advised   LISINOPRIL-HYDROCHLOROTHIAZIDE (PRINZIDE,ZESTORETIC) 20-12.5 MG TABLET    TAKE ONE TABLET BY MOUTH ONCE DAILY.   MELATONIN 3 MG TABS    Take 1 tablet by mouth at bedtime as needed.   SILDENAFIL (REVATIO) 20 MG TABLET    Generic Revatio / Sildanefil 20 mg. 2 - 5 tabs 30 mins prior to intercourse.   SIMVASTATIN (ZOCOR) 40 MG TABLET    TAKE 1 TABLET BY MOUTH AT BEDTIME *NEED OFFICE VISIT   TAMSULOSIN (FLOMAX) 0.4 MG CAPS CAPSULE    Take 1 capsule (0.4 mg total) by mouth daily.   VENLAFAXINE XR (EFFEXOR-XR) 75 MG 24 HR CAPSULE    TAKE THREE (3) CAPSULES BY MOUTH DAILY  Modified Medications   No medications on file  Discontinued Medications   DEPO-TESTOSTERONE 100 MG/ML INJECTION    INJECT 1 ML INTRAMUSCULARLY EVERY 2 WEEKS

## 2015-09-05 ENCOUNTER — Encounter: Payer: Self-pay | Admitting: Family Medicine

## 2015-09-18 ENCOUNTER — Ambulatory Visit (INDEPENDENT_AMBULATORY_CARE_PROVIDER_SITE_OTHER): Payer: Medicare Other

## 2015-09-18 DIAGNOSIS — R37 Sexual dysfunction, unspecified: Secondary | ICD-10-CM | POA: Diagnosis not present

## 2015-09-18 MED ORDER — TESTOSTERONE CYPIONATE 200 MG/ML IM SOLN
150.0000 mg | Freq: Once | INTRAMUSCULAR | Status: AC
  Administered 2015-09-18: 150 mg via INTRAMUSCULAR

## 2015-10-02 ENCOUNTER — Ambulatory Visit (INDEPENDENT_AMBULATORY_CARE_PROVIDER_SITE_OTHER): Payer: Medicare Other

## 2015-10-02 DIAGNOSIS — R37 Sexual dysfunction, unspecified: Secondary | ICD-10-CM | POA: Diagnosis not present

## 2015-10-02 MED ORDER — TESTOSTERONE CYPIONATE 200 MG/ML IM SOLN
150.0000 mg | Freq: Once | INTRAMUSCULAR | Status: AC
  Administered 2015-10-02: 150 mg via INTRAMUSCULAR

## 2015-10-10 ENCOUNTER — Ambulatory Visit (INDEPENDENT_AMBULATORY_CARE_PROVIDER_SITE_OTHER): Payer: Medicare Other | Admitting: Family Medicine

## 2015-10-10 ENCOUNTER — Encounter: Payer: Self-pay | Admitting: Family Medicine

## 2015-10-10 VITALS — BP 130/70 | HR 78 | Temp 97.7°F | Ht 71.0 in | Wt 251.0 lb

## 2015-10-10 DIAGNOSIS — R0781 Pleurodynia: Secondary | ICD-10-CM | POA: Insufficient documentation

## 2015-10-10 MED ORDER — TRAMADOL HCL 50 MG PO TABS: 50.0000 mg | ORAL_TABLET | Freq: Three times a day (TID) | ORAL | Status: DC | PRN

## 2015-10-10 NOTE — Assessment & Plan Note (Signed)
New problem. Exam reassuring. MSK in etiology. Treating with PRN Tramadol.

## 2015-10-10 NOTE — Progress Notes (Signed)
Subjective:  Patient ID: [name], male    DOB: Aug 15, 1945  Age: 70 y.o. [name]: [id number]  CC: Rib pain  HPI:  70 year old male with HTN, HLD, DM-1, CKD presents with the above complaint.  Patient states that he developed R lower rib pain (anterior) 4-5 days ago. Unclear reason. No recent fall, trauma, injury.. Worse with pressure/palpation. No associated abdominal pain. No other reported symptoms. He has taken tylenol without improvement. No other complaints today.   Social Hx   Social History   Social History  . Marital Status: Married    Spouse Name: N/A  . Number of Children: N/A  . Years of Education: N/A   Occupational History  . Dispatcher   . retired Administrator    Social History Main Topics  . Smoking status: Former Smoker -- 3.00 packs/day for 35 years    Types: Cigarettes    Quit date: 04/12/2000  . Smokeless tobacco: Never Used  . Alcohol Use: No  . Drug Use: No  . Sexual Activity: No   Other Topics Concern  . None   Social History Narrative   No regular exercise   Caffeine use: yes, dt coke    Review of Systems  Constitutional: Negative.   Gastrointestinal: Negative.   Musculoskeletal:       Rib pain.   Objective:  BP 130/70 mmHg  Pulse 78  Temp(Src) 97.7 F (36.5 C)  Ht '5\' 11"'$  (1.803 m)  Wt 251 lb (113.853 kg)  BMI 35.02 kg/m2  SpO2 97%  BP/Weight 10/10/2015 09/04/2015 2/70/3500  Systolic BP 938 182 993  Diastolic BP 70 60 60  Wt. (Lbs) 251 249.25 251  BMI 35.02 34.78 35.02   Physical Exam  Constitutional: He is oriented to person, place, and time. He appears well-developed. No distress.  Cardiovascular: Normal rate.   Pulmonary/Chest: Effort normal. He has no wheezes. He has no rales.  Abdominal: Soft. He exhibits no distension. There is no tenderness. There is no rebound and no guarding.  Musculoskeletal:  Tenderness of the anterior right lower rib (approximately rib 7-8).   Neurological: He is alert and oriented to  person, place, and time.  Psychiatric: He has a normal mood and affect.  Vitals reviewed.   Lab Results  Component Value Date   WBC 11.5* 12/05/2014   HGB 15.0 12/05/2014   HCT 44.0 12/05/2014   PLT 164 12/05/2014   GLUCOSE 105* 12/05/2014   CHOL 154 06/05/2015   TRIG * 06/05/2015    503.0 Triglyceride is over 400; calculations on Lipids are invalid.   HDL 30.60* 06/05/2015   LDLDIRECT 73.0 06/05/2015   LDLCALC 43 04/24/2009   ALT 26 12/05/2014   AST 30 12/05/2014   NA 136 12/05/2014   K 4.2 12/05/2014   CL 97* 12/05/2014   CREATININE 1.30* 12/05/2014   BUN 22* 12/05/2014   CO2 29 12/05/2014   TSH 1.31 10/18/2014   PSA 0.38 12/12/2013   INR 1.02 12/05/2014   HGBA1C 9.4* 06/05/2015   MICROALBUR 4.0* 12/12/2013    Assessment & Plan:   Problem List Items Addressed This Visit    Rib pain on right side - Primary    New problem. Exam reassuring. MSK in etiology. Treating with PRN Tramadol.          Meds ordered this encounter  Medications  . traMADol (ULTRAM) 50 MG tablet    Sig: Take 1 tablet (50 mg total) by mouth every 8 (eight) hours as  needed.    Dispense:  30 tablet    Refill:  0    Follow-up: PRN  [place]

## 2015-10-10 NOTE — Patient Instructions (Signed)
Take the medication as needed.  You can take tylenol as well.  Follow up as needed.  Take care  Dr. Lacinda Axon

## 2015-10-16 ENCOUNTER — Ambulatory Visit: Payer: Medicare Other

## 2015-10-17 ENCOUNTER — Ambulatory Visit (INDEPENDENT_AMBULATORY_CARE_PROVIDER_SITE_OTHER): Payer: Medicare Other | Admitting: *Deleted

## 2015-10-17 DIAGNOSIS — E291 Testicular hypofunction: Secondary | ICD-10-CM

## 2015-10-17 DIAGNOSIS — R7989 Other specified abnormal findings of blood chemistry: Secondary | ICD-10-CM

## 2015-10-17 MED ORDER — TESTOSTERONE CYPIONATE 200 MG/ML IM SOLN
150.0000 mg | Freq: Once | INTRAMUSCULAR | Status: AC
  Administered 2015-10-17: 150 mg via INTRAMUSCULAR

## 2015-10-30 ENCOUNTER — Ambulatory Visit (INDEPENDENT_AMBULATORY_CARE_PROVIDER_SITE_OTHER): Payer: Medicare Other | Admitting: *Deleted

## 2015-10-30 DIAGNOSIS — E291 Testicular hypofunction: Secondary | ICD-10-CM | POA: Diagnosis not present

## 2015-10-30 DIAGNOSIS — E349 Endocrine disorder, unspecified: Secondary | ICD-10-CM

## 2015-10-30 MED ORDER — TESTOSTERONE CYPIONATE 200 MG/ML IM SOLN
150.0000 mg | Freq: Once | INTRAMUSCULAR | Status: AC
  Administered 2015-10-30: 150 mg via INTRAMUSCULAR

## 2015-11-11 ENCOUNTER — Other Ambulatory Visit: Payer: Self-pay | Admitting: Family Medicine

## 2015-11-14 ENCOUNTER — Ambulatory Visit (INDEPENDENT_AMBULATORY_CARE_PROVIDER_SITE_OTHER): Payer: Medicare Other

## 2015-11-14 DIAGNOSIS — E291 Testicular hypofunction: Secondary | ICD-10-CM | POA: Diagnosis not present

## 2015-11-14 DIAGNOSIS — E349 Endocrine disorder, unspecified: Secondary | ICD-10-CM

## 2015-11-14 MED ORDER — TESTOSTERONE CYPIONATE 200 MG/ML IM SOLN
150.0000 mg | Freq: Once | INTRAMUSCULAR | Status: AC
  Administered 2015-11-14: 150 mg via INTRAMUSCULAR

## 2015-11-18 ENCOUNTER — Ambulatory Visit (INDEPENDENT_AMBULATORY_CARE_PROVIDER_SITE_OTHER): Payer: Medicare Other | Admitting: Family Medicine

## 2015-11-18 ENCOUNTER — Encounter: Payer: Self-pay | Admitting: Family Medicine

## 2015-11-18 DIAGNOSIS — E1065 Type 1 diabetes mellitus with hyperglycemia: Secondary | ICD-10-CM

## 2015-11-18 DIAGNOSIS — E1040 Type 1 diabetes mellitus with diabetic neuropathy, unspecified: Secondary | ICD-10-CM

## 2015-11-18 DIAGNOSIS — IMO0002 Reserved for concepts with insufficient information to code with codable children: Secondary | ICD-10-CM

## 2015-11-18 NOTE — Progress Notes (Signed)
Pre visit review using our clinic review tool, if applicable. No additional management support is needed unless otherwise documented below in the visit note. 

## 2015-11-18 NOTE — Patient Instructions (Signed)
Fill out the grid with pre and post meal sugars and drop that off. I think these episodes could be sugar related.   Keep a snack handy in the meantime, in case you have other episodes.  Take care.  Glad to see you.  Update me as needed.

## 2015-11-18 NOTE — Progress Notes (Signed)
Dizzy.  Intermittent.  Started about 1 year ago, intermittently.  Worse recently, more often.  It last a few seconds.  Quick onset.  The room doesn't spin, doesn't have sensation of motion.  He'll feel like his sugar has dropped.  He has checked sugar after an event, ~180 recent after an event.  Sugar had been elevated at baseline, up to 200s.  He will need to stand up slowly due to tendency for orthostasis at baseline but the the most recent episode wasn't positional.     Recent nasal congestion and yellow rhinorrhea for the last few days.  No fevers.  No facial pain.  No ear pain.    Meds, vitals, and allergies reviewed.   ROS: Per HPI unless specifically indicated in ROS section   GEN: nad, alert and oriented HEENT: mucous membranes moist, tm w/o erythema, nasal exam w/o erythema, clear discharge noted,  OP with cobblestoning, sinuses minimally ttp on the L side of face NECK: supple w/o LA CV: rrr.   PULM: ctab, no inc wob EXT: no edema SKIN: no acute rash Not orthostatic on standing.   No sx with head turning at OV.

## 2015-11-19 NOTE — Assessment & Plan Note (Signed)
This is likely from "relative" hypoglycemia, with such longstanding elevated sugars that anything slightly lower than his baseline gives him sx.  D/w pt. He doesn't have true vertigo sx.  He may have autonomic sx contributing from years of DM2.  D/w pt.  Needs better control of DM2, I gave him a grid for pre/post sugars so we can try to improve his control.  He still needs weight control, etc.  Routed to PCP as FYI.

## 2015-11-26 ENCOUNTER — Other Ambulatory Visit: Payer: Self-pay | Admitting: Family Medicine

## 2015-11-27 ENCOUNTER — Telehealth: Payer: Self-pay | Admitting: Family Medicine

## 2015-11-27 ENCOUNTER — Ambulatory Visit (INDEPENDENT_AMBULATORY_CARE_PROVIDER_SITE_OTHER): Payer: Medicare Other | Admitting: *Deleted

## 2015-11-27 DIAGNOSIS — E291 Testicular hypofunction: Secondary | ICD-10-CM | POA: Diagnosis not present

## 2015-11-27 DIAGNOSIS — E349 Endocrine disorder, unspecified: Secondary | ICD-10-CM

## 2015-11-27 MED ORDER — TESTOSTERONE CYPIONATE 200 MG/ML IM SOLN
150.0000 mg | INTRAMUSCULAR | Status: DC
  Administered 2015-11-27 – 2016-02-05 (×3): 150 mg via INTRAMUSCULAR

## 2015-11-27 MED ORDER — TESTOSTERONE CYPIONATE 200 MG/ML IM SOLN: 150.0000 mg | INTRAMUSCULAR | Status: DC

## 2015-11-27 NOTE — Telephone Encounter (Signed)
I need more data.  There are only 3 pairs of pre/post meal sugars spread across 3 days.  He doesn't have to check 6 times a day, but I need more info to try to help him.

## 2015-11-27 NOTE — Telephone Encounter (Signed)
Pt dropped off a log of sugar readings as requested. I placed on the cart for drop off.

## 2015-11-27 NOTE — Progress Notes (Signed)
Routed to PCP 

## 2015-11-28 NOTE — Telephone Encounter (Signed)
Patient advised.   Patient will work on this and bring in more readings.

## 2015-12-11 ENCOUNTER — Ambulatory Visit (INDEPENDENT_AMBULATORY_CARE_PROVIDER_SITE_OTHER): Payer: Medicare Other | Admitting: Family Medicine

## 2015-12-11 DIAGNOSIS — E291 Testicular hypofunction: Secondary | ICD-10-CM | POA: Diagnosis not present

## 2015-12-11 DIAGNOSIS — E349 Endocrine disorder, unspecified: Secondary | ICD-10-CM

## 2015-12-11 MED ORDER — TESTOSTERONE CYPIONATE 200 MG/ML IM SOLN
150.0000 mg | Freq: Once | INTRAMUSCULAR | Status: AC
  Administered 2015-12-11: 150 mg via INTRAMUSCULAR

## 2015-12-13 NOTE — Progress Notes (Signed)
Nurse visit

## 2015-12-20 DIAGNOSIS — M5442 Lumbago with sciatica, left side: Secondary | ICD-10-CM | POA: Diagnosis not present

## 2015-12-25 ENCOUNTER — Ambulatory Visit (INDEPENDENT_AMBULATORY_CARE_PROVIDER_SITE_OTHER): Payer: Medicare Other

## 2015-12-25 ENCOUNTER — Other Ambulatory Visit: Payer: Self-pay | Admitting: *Deleted

## 2015-12-25 DIAGNOSIS — E349 Endocrine disorder, unspecified: Secondary | ICD-10-CM

## 2015-12-25 DIAGNOSIS — E291 Testicular hypofunction: Secondary | ICD-10-CM

## 2015-12-25 MED ORDER — TESTOSTERONE CYPIONATE 200 MG/ML IM SOLN
150.0000 mg | Freq: Once | INTRAMUSCULAR | Status: AC
  Administered 2015-12-25: 150 mg via INTRAMUSCULAR

## 2015-12-25 MED ORDER — TESTOSTERONE CYPIONATE 200 MG/ML IM SOLN: 150.0000 mg | Freq: Once | INTRAMUSCULAR | 0 refills | Status: DC

## 2015-12-25 NOTE — Telephone Encounter (Signed)
Last office visit 11/18/2015 with Dr. Damita Dunnings.  Last refilled 11/27/2015.

## 2015-12-25 NOTE — Telephone Encounter (Signed)
Rx faxed to [facility] [phone].

## 2015-12-26 ENCOUNTER — Telehealth: Payer: Self-pay

## 2015-12-26 NOTE — Telephone Encounter (Signed)
[place] from [facility] received new testosterone rx and wanted to verify how often pt gets 150 mg. Per chart and Butch Penny CMA q 14 days. [place] voiced understanding.

## 2016-01-05 ENCOUNTER — Ambulatory Visit (INDEPENDENT_AMBULATORY_CARE_PROVIDER_SITE_OTHER): Payer: Medicare Other

## 2016-01-05 DIAGNOSIS — Z23 Encounter for immunization: Secondary | ICD-10-CM

## 2016-01-06 DIAGNOSIS — L219 Seborrheic dermatitis, unspecified: Secondary | ICD-10-CM | POA: Diagnosis not present

## 2016-01-06 DIAGNOSIS — Z23 Encounter for immunization: Secondary | ICD-10-CM | POA: Diagnosis not present

## 2016-01-08 ENCOUNTER — Ambulatory Visit (INDEPENDENT_AMBULATORY_CARE_PROVIDER_SITE_OTHER): Payer: Medicare Other | Admitting: Family Medicine

## 2016-01-08 DIAGNOSIS — E291 Testicular hypofunction: Secondary | ICD-10-CM

## 2016-01-08 DIAGNOSIS — E349 Endocrine disorder, unspecified: Secondary | ICD-10-CM

## 2016-01-08 MED ORDER — TESTOSTERONE CYPIONATE 200 MG/ML IM SOLN
150.0000 mg | Freq: Once | INTRAMUSCULAR | Status: AC
  Administered 2016-01-08: 150 mg via INTRAMUSCULAR

## 2016-01-09 NOTE — Progress Notes (Signed)
Please discuss with Mrs. Wagoner documentation with testosterone injections. (or b12, etc)

## 2016-01-12 ENCOUNTER — Encounter: Payer: Self-pay | Admitting: Internal Medicine

## 2016-01-12 ENCOUNTER — Ambulatory Visit (INDEPENDENT_AMBULATORY_CARE_PROVIDER_SITE_OTHER): Payer: Medicare Other | Admitting: Internal Medicine

## 2016-01-12 VITALS — BP 158/70 | HR 103 | Temp 98.3°F | Wt 243.5 lb

## 2016-01-12 DIAGNOSIS — R05 Cough: Secondary | ICD-10-CM | POA: Diagnosis not present

## 2016-01-12 DIAGNOSIS — J0100 Acute maxillary sinusitis, unspecified: Secondary | ICD-10-CM

## 2016-01-12 DIAGNOSIS — R059 Cough, unspecified: Secondary | ICD-10-CM

## 2016-01-12 MED ORDER — HYDROCODONE-HOMATROPINE 5-1.5 MG/5ML PO SYRP: 5.0000 mL | ORAL_SOLUTION | Freq: Three times a day (TID) | ORAL | 0 refills | Status: DC | PRN

## 2016-01-12 MED ORDER — AMOXICILLIN-POT CLAVULANATE 875-125 MG PO TABS: 1.0000 | ORAL_TABLET | Freq: Two times a day (BID) | ORAL | 0 refills | Status: DC

## 2016-01-12 NOTE — Patient Instructions (Signed)

## 2016-01-12 NOTE — Progress Notes (Signed)
HPI  Pt presents to the clinic today with c/o nasal congestion, sore throat and cough. This started 1 week ago. He is blowing yellow/green mucous out of his nose. He has is having difficulty swallowing and has noticed blisters inside his mouth. The cough is productive of yellow/brown mucous. He is mildly short of breath. He denies fever, but had body aches and chills. He has tried salt water gargles and antihistamines without any relief. He does have a history of seasonal allergies. He has not had sick contacts that he is aware of. He is UTD on his flu and pneumonia shot. He got the flu shot last week and thinks this is related.   Review of Systems      Past Medical History:  Diagnosis Date  . Allergic rhinitis due to pollen   . Chronic kidney disease, stage III (moderate) 12/19/2012  . Depression   . Diabetes mellitus ([place])   . Diverticulosis   . Erectile dysfunction associated with type 2 diabetes mellitus ([place]) 05/28/2015  . Former very heavy cigarette smoker (more than 40 per day) 10/07/2014  . GERD (gastroesophageal reflux disease)   . Gout   . Hyperlipidemia   . Hypertension   . Hypogonadism male 06/14/2012  . Iron deficiency   . Low testosterone   . Osteoarthritis   . Personal history of colonic polyps    1996    Family History  Problem Relation Age of Onset  . Diabetes Mother   . Diabetes Father   . Lung cancer Sister     lung  . Heart disease Maternal Grandfather   . COPD Sister   . Heart attack Sister   . Heart disease Sister     Social History   Social History  . Marital status: Married    Spouse name: N/A  . Number of children: N/A  . 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 Smoking status: Former Smoker    Packs/day: 3.00    Years: 35.00    Types: Cigarettes    Quit date: 04/12/2000  . Smokeless tobacco: Never Used  . Alcohol use No  . Drug use: No  . Sexual  activity: No   Other Topics Concern  . Not on file   Social History Narrative   No regular exercise   Caffeine use: yes, dt coke    Allergies  Allergen Reactions  . Tetracycline Itching and Rash     Constitutional: Positive headache, fatigue. Denies fever or abrupt weight changes.  HEENT:  Positive nasal congestion, sore throat. Denies eye redness, eye pain, pressure behind the eyes, facial pain, ear pain, ringing in the ears, wax buildup, runny nose or bloody nose. Respiratory: Positive cough and shortness of breath. Denies difficulty breathing.  Cardiovascular: Denies chest pain, chest tightness, palpitations or swelling in the hands or feet.   No other specific complaints in a complete review of systems (except as listed in HPI above).  Objective:   BP (!) 158/70   Pulse (!) 103   Temp 98.3 F (36.8 C) (Oral)   Wt 243 lb 8 oz (110.5 kg)   SpO2 97%   BMI 33.96 kg/m   Wt Readings from Last 3 Encounters:  01/12/16 243 lb 8 oz (110.5 kg)  11/18/15 245 lb 8 oz (111.4 kg)  10/10/15 251 lb (113.9 kg)     General: Appears his stated  age, ill appearing in NAD. HEENT: Head: normal shape and size, maxillary sinus tenderness noted; Eyes: sclera injected, no icterus, conjunctiva pink; Ears: Tm's gray and intact, normal light reflex; Nose: mucosa boggy and moist, septum midline; Throat/Mouth: + PND. Teeth present, mucosa erythematous and moist, no exudate noted, no lesions or ulcerations noted.  Neck: No cervical lymphadenopathy.  Cardiovascular: Tachycardic with normal rhythm. S1,S2 noted.  No murmur, rubs or gallops noted.  Pulmonary/Chest: Normal effort and positive vesicular breath sounds. No respiratory distress. No wheezes, rales or ronchi noted.      Assessment & Plan:   Acute Maxillary Sinusitis:  Get some rest and drink plenty of water Do salt water gargles for the sore throat Start Flonase OTC eRx for Augmentin BID x 10 days Rx for Hycodan cough syrup  RTC as  needed or if symptoms persist.   Webb Silversmith, NP

## 2016-01-22 ENCOUNTER — Ambulatory Visit (INDEPENDENT_AMBULATORY_CARE_PROVIDER_SITE_OTHER): Payer: Medicare Other | Admitting: *Deleted

## 2016-01-22 DIAGNOSIS — E349 Endocrine disorder, unspecified: Secondary | ICD-10-CM

## 2016-02-05 ENCOUNTER — Ambulatory Visit: Payer: Medicare Other

## 2016-02-05 ENCOUNTER — Telehealth: Payer: Self-pay | Admitting: *Deleted

## 2016-02-05 ENCOUNTER — Ambulatory Visit (INDEPENDENT_AMBULATORY_CARE_PROVIDER_SITE_OTHER): Payer: Medicare Other | Admitting: Internal Medicine

## 2016-02-05 ENCOUNTER — Encounter: Payer: Self-pay | Admitting: Internal Medicine

## 2016-02-05 VITALS — BP 136/84 | HR 71 | Temp 97.6°F | Wt 250.0 lb

## 2016-02-05 DIAGNOSIS — E349 Endocrine disorder, unspecified: Secondary | ICD-10-CM

## 2016-02-05 DIAGNOSIS — J301 Allergic rhinitis due to pollen: Secondary | ICD-10-CM | POA: Diagnosis not present

## 2016-02-05 DIAGNOSIS — E291 Testicular hypofunction: Secondary | ICD-10-CM | POA: Diagnosis not present

## 2016-02-05 DIAGNOSIS — K4090 Unilateral inguinal hernia, without obstruction or gangrene, not specified as recurrent: Secondary | ICD-10-CM | POA: Diagnosis not present

## 2016-02-05 MED ORDER — MOMETASONE FUROATE 50 MCG/ACT NA SUSP: 2.0000 | Freq: Every day | NASAL | 12 refills | Status: DC

## 2016-02-05 NOTE — Progress Notes (Signed)
Subjective:    Patient ID: [name], male    DOB: 02-27-1946, 70 y.o.   [name]: [id number]  HPI  Pt presents to the clinic today with c/o sore throat, post nasal drip and cough. This started about 1 month ago. He was seen 10/2 for the same, diagnosed with a sinus infection and treated with Augmentin. He reports his symptoms improved while on the antibiotics but he continues to have a cough productive of yellow mucous. He is mildly short of breath, but denies fever, chills or body aches. He has tried Mucinex OTC with minimal relief. He does have a history of seasonal allergies and DM 1. He has not had sick contacts.  He also c/o pain in his right groin. He reports he was doing some heavy lifting over the weekend and then noticed the pain in his right groin. He has not noticed any bulging. He describes the pain as sharp and stabbing. He has not tried anything OTC for this.  Review of Systems      Past Medical History:  Diagnosis Date  . Allergic rhinitis due to pollen   . Chronic kidney disease, stage III (moderate) 12/19/2012  . Depression   . Diabetes mellitus ([place])   . Diverticulosis   . Erectile dysfunction associated with type 2 diabetes mellitus ([name]) 05/28/2015  . Former very heavy cigarette smoker (more than 40 per day) 10/07/2014  . GERD (gastroesophageal reflux disease)   . Gout   . Hyperlipidemia   . Hypertension   . Hypogonadism male 06/14/2012  . Iron deficiency   . Low testosterone   . Osteoarthritis   . Personal history of colonic polyps    1996    Current Outpatient Prescriptions  Medication Sig Dispense Refill  . aspirin 81 MG chewable tablet Chew 81 mg by mouth at bedtime.     [name] Kitchen atenolol (TENORMIN) 100 MG tablet TAKE ONE TABLET BY MOUTH ONCE DAILY. 90 tablet 1  . b complex vitamins tablet Take 1 tablet by mouth daily.      . cholecalciferol (VITAMIN D) 1000 UNITS tablet Take 1,000 Units by mouth daily.      [name] Kitchen doxazosin (CARDURA) 8 MG tablet TAKE ONE TABLET BY  MOUTH AT BEDTIME 90 tablet 3  . famotidine (PEPCID) 10 MG tablet Take 10 mg by mouth daily.      . fluticasone (FLONASE) 50 MCG/ACT nasal spray INHALE 2 SPRAYS INTO EACH NOSTRIL EVERY DAY AS NEEDED 16 g 5  . gabapentin (NEURONTIN) 300 MG capsule Take 1 capsule (300 mg total) by mouth 2 (two) times daily. 90 capsule 3  . HYDROcodone-acetaminophen (NORCO/VICODIN) 5-325 MG tablet Take 1 tablet by mouth every 6 (six) hours as needed for moderate pain. 30 tablet 0  . insulin NPH Human (HUMULIN N,NOVOLIN N) 100 UNIT/ML injection Inject 0.5 mLs (50 Units total) into the skin 3 (three) times daily. 30 mL 1  . insulin regular (NOVOLIN R,HUMULIN R) 100 units/mL injection Inject up to 150 units a day as advised 40 mL 1  . lisinopril-hydrochlorothiazide (PRINZIDE,ZESTORETIC) 20-12.5 MG tablet TAKE ONE TABLET BY MOUTH ONCE DAILY. 90 tablet 1  . Melatonin 3 MG TABS Take 1 tablet by mouth at bedtime as needed.    . sildenafil (REVATIO) 20 MG tablet Generic Revatio / Sildanefil 20 mg. 2 - 5 tabs 30 mins prior to intercourse. 10 tablet prn  . simvastatin (ZOCOR) 40 MG tablet TAKE 1 TABLET BY MOUTH AT BEDTIME *NEED OFFICE VISIT 30 tablet 5  .  tamsulosin (FLOMAX) 0.4 MG CAPS capsule Take 1 capsule (0.4 mg total) by mouth daily. 30 capsule 5  . traMADol (ULTRAM) 50 MG tablet Take 1 tablet (50 mg total) by mouth every 8 (eight) hours as needed. 30 tablet 0  . venlafaxine XR (EFFEXOR-XR) 75 MG 24 hr capsule TAKE THREE (3) CAPSULES BY MOUTH DAILY 270 capsule 1  . testosterone cypionate (DEPOTESTOSTERONE CYPIONATE) 200 MG/ML injection Inject 0.75 mLs (150 mg total) into the muscle once. 10 mL 0   Current Facility-Administered Medications  Medication Dose Route Frequency Provider Last Rate Last Dose  . testosterone cypionate (DEPOTESTOSTERONE CYPIONATE) injection 150 mg  150 mg Intramuscular Q14 Days [name], MD   150 mg at 01/22/16 1538    Allergies  Allergen Reactions  . Tetracycline Itching and Rash     Family History  Problem Relation Age of Onset  . Diabetes Mother   . Diabetes Father   . Lung cancer Sister     lung  . Heart disease Maternal Grandfather   . COPD Sister   . Heart attack Sister   . Heart disease Sister     Social History   Social History  . Marital status: Married    Spouse name: N/A  . Number of children: N/A  . 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 Smoking status: Former Smoker    Packs/day: 3.00    Years: 35.00    Types: Cigarettes    Quit date: 04/12/2000  . Smokeless tobacco: Never Used  . Alcohol use No  . Drug use: No  . Sexual activity: No   Other Topics Concern  . Not on file   Social History Narrative   No regular exercise   Caffeine use: yes, dt coke     Constitutional: Denies fever, malaise, fatigue, headache or abrupt weight changes.  HEENT: Pt reports runny nose, sore throat. Denies eye pain, eye redness, ear pain, ringing in the ears, wax buildup, nasal congestion, bloody nose. Respiratory: Pt reports cough. Denies difficulty breathing, shortness of breath, or sputum production.   Cardiovascular: Denies chest pain, chest tightness, palpitations or swelling in the hands or feet.  MSK: Pt reports right groin pain. Denies decrease in range of motion, muscle pain, joint pain and swelling.   No other specific complaints in a complete review of systems (except as listed in HPI above).  Objective:   Physical Exam   BP 136/84   Pulse 71   Temp 97.6 F (36.4 C) (Oral)   Wt 250 lb (113.4 kg)   SpO2 95%   BMI 34.87 kg/m  Wt Readings from Last 3 Encounters:  02/05/16 250 lb (113.4 kg)  01/12/16 243 lb 8 oz (110.5 kg)  11/18/15 245 lb 8 oz (111.4 kg)    General: Appears his stated age, obese in NAD. HEENT: Head: normal shape and size, no sinus tenderness noted; Ears: Tm's gray and intact, normal light reflex; Nose: mucosa boggy  and moist, septum midline; Throat/Mouth: Teeth present, mucosa pink and moist, + PND, no exudate, lesions or ulcerations noted.  Neck:  No adenopathy noted. Cardiovascular: Normal rate and rhythm. S1,S2 noted.   Pulmonary/Chest: Normal effort and positive vesicular breath sounds. No respiratory distress. No wheezes, rales or ronchi noted.  Abdomen: Soft and nontender.  Musculoskeletal: Bulge noted of right inguinal area, tender to palpation.   BMET  Component Value Date/Time   NA 136 12/05/2014 1749   NA 130 (L) 11/25/2011 1119   NA 137 11/16/2011 0513   K 4.2 12/05/2014 1749   K 4.1 11/16/2011 0513   CL 97 (L) 12/05/2014 1749   CL 100 11/16/2011 0513   CO2 29 12/05/2014 1710   CO2 29 11/16/2011 0513   GLUCOSE 105 (H) 12/05/2014 1749   GLUCOSE 239 (H) 11/16/2011 0513   BUN 22 (H) 12/05/2014 1749   BUN 25 11/25/2011 1119   BUN 15 11/16/2011 0513   CREATININE 1.30 (H) 12/05/2014 1749   CREATININE 1.26 11/16/2011 0513   CALCIUM 9.4 12/05/2014 1710   CALCIUM 9.1 11/16/2011 0513   GFRNONAA 51 (L) 12/05/2014 1710   GFRNONAA 59 (L) 11/16/2011 0513   GFRAA 59 (L) 12/05/2014 1710   GFRAA >60 11/16/2011 0513    Lipid Panel     Component Value Date/Time   CHOL 154 06/05/2015 0838   TRIG (H) 06/05/2015 0838    503.0 Triglyceride is over 400; calculations on Lipids are invalid.   HDL 30.60 (L) 06/05/2015 0838   CHOLHDL 5 06/05/2015 0838   VLDL 127.0 (H) 12/12/2013 0902   LDLCALC 43 04/24/2009    CBC    Component Value Date/Time   WBC 11.5 (H) 12/05/2014 1710   RBC 4.65 12/05/2014 1710   HGB 15.0 12/05/2014 1749   HGB 12.8 (L) 11/16/2011 0513   HCT 44.0 12/05/2014 1749   HCT 36.1 (L) 11/16/2011 0513   PLT 164 12/05/2014 1710   PLT 205 11/16/2011 0513   MCV 88.4 12/05/2014 1710   MCV 90 11/16/2011 0513   MCH 31.0 12/05/2014 1710   MCHC 35.0 12/05/2014 1710   RDW 12.7 12/05/2014 1710   RDW 13.1 11/16/2011 0513   LYMPHSABS 1.2 12/05/2014 1710   LYMPHSABS 1.8  11/16/2011 0513   MONOABS 1.0 12/05/2014 1710   MONOABS 0.5 11/16/2011 0513   EOSABS 0.0 12/05/2014 1710   EOSABS 0.1 11/16/2011 0513   BASOSABS 0.0 12/05/2014 1710   BASOSABS 0.0 11/16/2011 0513    Hgb A1C Lab Results  Component Value Date   HGBA1C 9.4 (H) 06/05/2015        Assessment & Plan:   Allergic Rhinitis:  eRx for Nasonex OTC Start Zyrtec daily at night x 1 week If symptoms persist, we can try oral steroids, but given DM 1, I would like to stay away from steroids if possible  Right inguinal hernia:  Discussed treatment He is not interested in surgical intervention at this time Red flags discussed  RTC as needed or if symptoms persist or worsen Jackqueline Aquilar, NP

## 2016-02-05 NOTE — Patient Instructions (Signed)
Inguinal Hernia, Adult Muscles help keep everything in the body in its proper place. But if a weak spot in the muscles develops, something can poke through. That is called a hernia. When this happens in the lower part of the belly (abdomen), it is called an inguinal hernia. (It takes its name from a part of the body in this region called the inguinal canal.) A weak spot in the wall of muscles lets some fat or part of the small intestine bulge through. An inguinal hernia can develop at any age. Men get them more often than women. CAUSES  In adults, an inguinal hernia develops over time.  It can be triggered by:  Suddenly straining the muscles of the lower abdomen.  Lifting heavy objects.  Straining to have a bowel movement. Difficult bowel movements (constipation) can lead to this.  Constant coughing. This may be caused by smoking or lung disease.  Being overweight.  Being pregnant.  Working at a job that requires long periods of standing or heavy lifting.  Having had an inguinal hernia before. One type can be an emergency situation. It is called a strangulated inguinal hernia. It develops if part of the small intestine slips through the weak spot and cannot get back into the abdomen. The blood supply can be cut off. If that happens, part of the intestine may die. This situation requires emergency surgery. SYMPTOMS  Often, a small inguinal hernia has no symptoms. It is found when a healthcare provider does a physical exam. Larger hernias usually have symptoms.   In adults, symptoms may include:  A lump in the groin. This is easier to see when the person is standing. It might disappear when lying down.  In men, a lump in the scrotum.  Pain or burning in the groin. This occurs especially when lifting, straining or coughing.  A dull ache or feeling of pressure in the groin.  Signs of a strangulated hernia can include:  A bulge in the groin that becomes very painful and tender to the  touch.  A bulge that turns red or purple.  Fever, nausea and vomiting.  Inability to have a bowel movement or to pass gas. DIAGNOSIS  To decide if you have an inguinal hernia, a healthcare provider will probably do a physical examination.  This will include asking questions about any symptoms you have noticed.  The healthcare provider might feel the groin area and ask you to cough. If an inguinal hernia is felt, the healthcare provider may try to slide it back into the abdomen.  Usually no other tests are needed. TREATMENT  Treatments can vary. The size of the hernia makes a difference. Options include:  Watchful waiting. This is often suggested if the hernia is small and you have had no symptoms.  No medical procedure will be done unless symptoms develop.  You will need to watch closely for symptoms. If any occur, contact your healthcare provider right away.  Surgery. This is used if the hernia is larger or you have symptoms.  Open surgery. This is usually an outpatient procedure (you will not stay overnight in a hospital). An cut (incision) is made through the skin in the groin. The hernia is put back inside the abdomen. The weak area in the muscles is then repaired by herniorrhaphy or hernioplasty. Herniorrhaphy: in this type of surgery, the weak muscles are sewn back together. Hernioplasty: a patch or mesh is used to close the weak area in the abdominal wall.  Laparoscopy.   In this procedure, a surgeon makes small incisions. A thin tube with a tiny video camera (called a laparoscope) is put into the abdomen. The surgeon repairs the hernia with mesh by looking with the video camera and using two long instruments. HOME CARE INSTRUCTIONS   After surgery to repair an inguinal hernia:  You will need to take pain medicine prescribed by your healthcare provider. Follow all directions carefully.  You will need to take care of the wound from the incision.  Your activity will be  restricted for awhile. This will probably include no heavy lifting for several weeks. You also should not do anything too active for a few weeks. When you can return to work will depend on the type of job that you have.  During "watchful waiting" periods, you should:  Maintain a healthy weight.  Eat a diet high in fiber (fruits, vegetables and whole grains).  Drink plenty of fluids to avoid constipation. This means drinking enough water and other liquids to keep your urine clear or pale yellow.  Do not lift heavy objects.  Do not stand for long periods of time.  Quit smoking. This should keep you from developing a frequent cough. SEEK MEDICAL CARE IF:   A bulge develops in your groin area.  You feel pain, a burning sensation or pressure in the groin. This might be worse if you are lifting or straining.  You develop a fever of more than 100.5 F (38.1 C). SEEK IMMEDIATE MEDICAL CARE IF:   Pain in the groin increases suddenly.  A bulge in the groin gets bigger suddenly and does not go down.  For men, there is sudden pain in the scrotum. Or, the size of the scrotum increases.  A bulge in the groin area becomes red or purple and is painful to touch.  You have nausea or vomiting that does not go away.  You feel your heart beating much faster than normal.  You cannot have a bowel movement or pass gas.  You develop a fever of more than 102.0 F (38.9 C).   This information is not intended to replace advice given to you by your health care provider. Make sure you discuss any questions you have with your health care provider.   Document Released: 08/15/2008 Document Revised: 06/21/2011 Document Reviewed: 09/30/2014 Elsevier Interactive Patient Education 2016 Elsevier Inc.  

## 2016-02-05 NOTE — Telephone Encounter (Signed)
Received fax from [facility] requesting PA for Mometasone.  PA competed on CoverMyMeds.  Awaiting decision.

## 2016-02-13 MED ORDER — TESTOSTERONE CYPIONATE 200 MG/ML IM SOLN
150.0000 mg | Freq: Once | INTRAMUSCULAR | Status: AC
  Administered 2016-02-19: 150 mg via INTRAMUSCULAR

## 2016-02-13 NOTE — Addendum Note (Signed)
Addended by: Lurlean Nanny on: 02/13/2016 10:17 AM   Modules accepted: Orders

## 2016-02-16 ENCOUNTER — Other Ambulatory Visit: Payer: Self-pay | Admitting: Family Medicine

## 2016-02-16 DIAGNOSIS — I6523 Occlusion and stenosis of bilateral carotid arteries: Secondary | ICD-10-CM

## 2016-02-19 ENCOUNTER — Ambulatory Visit (INDEPENDENT_AMBULATORY_CARE_PROVIDER_SITE_OTHER): Payer: Medicare Other

## 2016-02-19 DIAGNOSIS — E349 Endocrine disorder, unspecified: Secondary | ICD-10-CM | POA: Diagnosis not present

## 2016-02-19 MED ORDER — TESTOSTERONE CYPIONATE 200 MG/ML IM SOLN
150.0000 mg | Freq: Once | INTRAMUSCULAR | Status: AC
  Administered 2016-02-19: 150 mg via INTRAMUSCULAR

## 2016-02-19 MED ORDER — TESTOSTERONE CYPIONATE 200 MG/ML IM SOLN: 150.0000 mg | Freq: Once | INTRAMUSCULAR | Status: DC

## 2016-02-27 ENCOUNTER — Ambulatory Visit: Payer: Medicare Other

## 2016-02-27 DIAGNOSIS — I6523 Occlusion and stenosis of bilateral carotid arteries: Secondary | ICD-10-CM | POA: Diagnosis not present

## 2016-03-03 ENCOUNTER — Ambulatory Visit (INDEPENDENT_AMBULATORY_CARE_PROVIDER_SITE_OTHER): Payer: Medicare Other

## 2016-03-03 DIAGNOSIS — E349 Endocrine disorder, unspecified: Secondary | ICD-10-CM

## 2016-03-03 MED ORDER — TESTOSTERONE CYPIONATE 200 MG/ML IM SOLN
150.0000 mg | Freq: Once | INTRAMUSCULAR | Status: AC
  Administered 2016-03-03: 150 mg via INTRAMUSCULAR

## 2016-03-12 ENCOUNTER — Encounter: Payer: Self-pay | Admitting: Internal Medicine

## 2016-03-12 ENCOUNTER — Ambulatory Visit (INDEPENDENT_AMBULATORY_CARE_PROVIDER_SITE_OTHER): Payer: Medicare Other | Admitting: Internal Medicine

## 2016-03-12 VITALS — BP 132/60 | HR 79 | Temp 98.9°F | Wt 246.0 lb

## 2016-03-12 DIAGNOSIS — I6523 Occlusion and stenosis of bilateral carotid arteries: Secondary | ICD-10-CM

## 2016-03-12 DIAGNOSIS — R197 Diarrhea, unspecified: Secondary | ICD-10-CM | POA: Diagnosis not present

## 2016-03-12 DIAGNOSIS — J0110 Acute frontal sinusitis, unspecified: Secondary | ICD-10-CM

## 2016-03-12 MED ORDER — PREDNISONE 10 MG PO TABS: ORAL_TABLET | ORAL | 0 refills | Status: DC

## 2016-03-12 MED ORDER — AZITHROMYCIN 250 MG PO TABS: ORAL_TABLET | ORAL | 0 refills | Status: DC

## 2016-03-12 NOTE — Progress Notes (Signed)
Subjective:    Patient ID: [name], male    DOB: 1945/12/24, 70 y.o.   [name]: [id number]  HPI  Pt presents to the clinic today with c/o runny nose, ear fullness and post nasal drip. This started 1-2 months ago. He is blowing blood tinged yellow mucous out of his nose. He denies ear pain. He has had some associated dizziness and intermittent facial pressure and pain. He denies difficulty swallowing, sore throat or cough. He denies fever, chills or body aches but reports he feels real weak. He was seen 10/2 for the same, treated with abx for sinusitis- some improvement. He was seen 10/27 for allergies, advised to stop Flonase, start Nasonex and start Zyrtec OTC. He reports he never picked up the Nasonex because it was too expensive, so he stuck with Flonase and Zyrtec. He has not had sick contacts that he is aware of.   He also reports intermittent diarrhea. This started 1 week ago. His stools are normal in color, just watery. It seems worse at night. He denies abdominal pain, cramping, nausea, vomiting or blood in his stool. He has taken Imodium AD with good relief. He does note some improvement today. He denies recent changes in diet or medication. He has not had sick contacts that he is aware of.  Review of Systems      Past Medical History:  Diagnosis Date  . Allergic rhinitis due to pollen   . Chronic kidney disease, stage III (moderate) 12/19/2012  . Depression   . Diabetes mellitus ([place])   . Diverticulosis   . Erectile dysfunction associated with type 2 diabetes mellitus ([place]) 05/28/2015  . Former very heavy cigarette smoker (more than 40 per day) 10/07/2014  . GERD (gastroesophageal reflux disease)   . Gout   . Hyperlipidemia   . Hypertension   . Hypogonadism male 06/14/2012  . Iron deficiency   . Low testosterone   . Osteoarthritis   . Personal history of colonic polyps    1996    Current Outpatient Prescriptions  Medication Sig Dispense Refill  . aspirin 81 MG chewable  tablet Chew 81 mg by mouth at bedtime.     [name] Kitchen atenolol (TENORMIN) 100 MG tablet TAKE ONE TABLET BY MOUTH ONCE DAILY. 90 tablet 1  . b complex vitamins tablet Take 1 tablet by mouth daily.      . cholecalciferol (VITAMIN D) 1000 UNITS tablet Take 1,000 Units by mouth daily.      [name] Kitchen doxazosin (CARDURA) 8 MG tablet TAKE ONE TABLET BY MOUTH AT BEDTIME 90 tablet 3  . famotidine (PEPCID) 10 MG tablet Take 10 mg by mouth daily.      . fluticasone (FLONASE) 50 MCG/ACT nasal spray INHALE 2 SPRAYS INTO EACH NOSTRIL EVERY DAY AS NEEDED 16 g 5  . gabapentin (NEURONTIN) 300 MG capsule Take 1 capsule (300 mg total) by mouth 2 (two) times daily. 90 capsule 3  . HYDROcodone-acetaminophen (NORCO/VICODIN) 5-325 MG tablet Take 1 tablet by mouth every 6 (six) hours as needed for moderate pain. 30 tablet 0  . insulin NPH Human (HUMULIN N,NOVOLIN N) 100 UNIT/ML injection Inject 0.5 mLs (50 Units total) into the skin 3 (three) times daily. 30 mL 1  . insulin regular (NOVOLIN R,HUMULIN R) 100 units/mL injection Inject up to 150 units a day as advised 40 mL 1  . lisinopril-hydrochlorothiazide (PRINZIDE,ZESTORETIC) 20-12.5 MG tablet TAKE ONE TABLET BY MOUTH ONCE DAILY. 90 tablet 1  . Melatonin 3 MG TABS Take 1  tablet by mouth at bedtime as needed.    . mometasone (NASONEX) 50 MCG/ACT nasal spray Place 2 sprays into the nose daily. 17 g 12  . sildenafil (REVATIO) 20 MG tablet Generic Revatio / Sildanefil 20 mg. 2 - 5 tabs 30 mins prior to intercourse. 10 tablet prn  . simvastatin (ZOCOR) 40 MG tablet TAKE 1 TABLET BY MOUTH AT BEDTIME *NEED OFFICE VISIT 30 tablet 5  . tamsulosin (FLOMAX) 0.4 MG CAPS capsule Take 1 capsule (0.4 mg total) by mouth daily. 30 capsule 5  . traMADol (ULTRAM) 50 MG tablet Take 1 tablet (50 mg total) by mouth every 8 (eight) hours as needed. 30 tablet 0  . venlafaxine XR (EFFEXOR-XR) 75 MG 24 hr capsule TAKE THREE (3) CAPSULES BY MOUTH DAILY 270 capsule 1  . testosterone cypionate (DEPOTESTOSTERONE  CYPIONATE) 200 MG/ML injection Inject 0.75 mLs (150 mg total) into the muscle once. 10 mL 0   No current facility-administered medications for this visit.     Allergies  Allergen Reactions  . Tetracycline Itching and Rash    Family History  Problem Relation Age of Onset  . Diabetes Mother   . Diabetes Father   . Lung cancer Sister     lung  . Heart disease Maternal Grandfather   . COPD Sister   . Heart attack Sister   . Heart disease Sister     Social History   Social History  . Marital status: Married    Spouse name: N/A  . Number of children: N/A  . Years of education: N/A   Occupational History  . Dispatcher   . retired truck driver [place]  . Smoking status: Former Smoker    Packs/day: 3.00    Years: 35.00    Types: Cigarettes    Quit date: 04/12/2000  . Smokeless tobacco: Never Used  . Alcohol use No  . Drug use: No  . Sexual activity: No   Other Topics Concern  . Not on file   Social History Narrative   No regular exercise   Caffeine use: yes, dt coke     Constitutional: Pt reports fatigue. Denies fever, malaise, headache or abrupt weight changes.  HEENT: Pt reports facial pain and pressure, ear fullness, nasal congestion. Denies eye pain, eye redness, ear pain, ringing in the ears, wax buildup, bloody nose, or sore throat. Respiratory: Denies difficulty breathing, shortness of breath, cough or sputum production.   Cardiovascular: Denies chest pain, chest tightness, palpitations or swelling in the hands or feet.  Gastrointestinal: Pt reports diarrhea. Denies abdominal pain, bloating, constipation, or blood in the stool.  Musculoskeletal: Pt reports weakness. Denies decrease in range of motion, muscle pain or joint pain and swelling.    No other specific complaints in a complete review of systems (except as listed in HPI above).  Objective:   Physical Exam   BP 132/60   Pulse 79   Temp 98.9 F (37.2 C)  (Oral)   Wt 246 lb (111.6 kg)   SpO2 96%   BMI 34.31 kg/m  Wt Readings from Last 3 Encounters:  03/12/16 246 lb (111.6 kg)  02/05/16 250 lb (113.4 kg)  01/12/16 243 lb 8 oz (110.5 kg)    General: Appears his stated age, in NAD. HEENT: Head: normal shape and size, frontal sinus tenderness noted; Eyes: sclera white, no icterus, conjunctiva pink; Ears: Tm's gray and intact, normal light reflex, + serous effusion bilaterally; Nose: mucosa pink and  moist, septum midline; Throat/Mouth: Teeth present, mucosa erythematous and moist,+ PND, no exudate, lesions or ulcerations noted.  Neck:  No adenopathy noted Cardiovascular: Normal rate and rhythm. S1,S2 noted.   Pulmonary/Chest: Normal effort and positive vesicular breath sounds. No respiratory distress. No wheezes, rales or ronchi noted.  Abdomen: Soft and nontender. Normal bowel sounds. No distention or masses noted.   BMET    Component Value Date/Time   NA 136 12/05/2014 1749   NA 130 (L) 11/25/2011 1119   NA 137 11/16/2011 0513   K 4.2 12/05/2014 1749   K 4.1 11/16/2011 0513   CL 97 (L) 12/05/2014 1749   CL 100 11/16/2011 0513   CO2 29 12/05/2014 1710   CO2 29 11/16/2011 0513   GLUCOSE 105 (H) 12/05/2014 1749   GLUCOSE 239 (H) 11/16/2011 0513   BUN 22 (H) 12/05/2014 1749   BUN 25 11/25/2011 1119   BUN 15 11/16/2011 0513   CREATININE 1.30 (H) 12/05/2014 1749   CREATININE 1.26 11/16/2011 0513   CALCIUM 9.4 12/05/2014 1710   CALCIUM 9.1 11/16/2011 0513   GFRNONAA 51 (L) 12/05/2014 1710   GFRNONAA 59 (L) 11/16/2011 0513   GFRAA 59 (L) 12/05/2014 1710   GFRAA >60 11/16/2011 0513    Lipid Panel     Component Value Date/Time   CHOL 154 06/05/2015 0838   TRIG (H) 06/05/2015 0838    503.0 Triglyceride is over 400; calculations on Lipids are invalid.   HDL 30.60 (L) 06/05/2015 0838   CHOLHDL 5 06/05/2015 0838   VLDL 127.0 (H) 12/12/2013 0902   LDLCALC 43 04/24/2009    CBC    Component Value Date/Time   WBC 11.5 (H)  12/05/2014 1710   RBC 4.65 12/05/2014 1710   HGB 15.0 12/05/2014 1749   HGB 12.8 (L) 11/16/2011 0513   HCT 44.0 12/05/2014 1749   HCT 36.1 (L) 11/16/2011 0513   PLT 164 12/05/2014 1710   PLT 205 11/16/2011 0513   MCV 88.4 12/05/2014 1710   MCV 90 11/16/2011 0513   MCH 31.0 12/05/2014 1710   MCHC 35.0 12/05/2014 1710   RDW 12.7 12/05/2014 1710   RDW 13.1 11/16/2011 0513   LYMPHSABS 1.2 12/05/2014 1710   LYMPHSABS 1.8 11/16/2011 0513   MONOABS 1.0 12/05/2014 1710   MONOABS 0.5 11/16/2011 0513   EOSABS 0.0 12/05/2014 1710   EOSABS 0.1 11/16/2011 0513   BASOSABS 0.0 12/05/2014 1710   BASOSABS 0.0 11/16/2011 0513    Hgb A1C Lab Results  Component Value Date   HGBA1C 9.4 (H) 06/05/2015           Assessment & Plan:   Acute frontal sinusitis:  Given duration of symptoms, will treat with abx eRx for Azithromax x 5 days eRx for Pred Taper x 6 days (monitor sugars, if persist> 400, call me Continue Zyrtec for now  Diarrhea:  ? Viral Seems to be improving Drink plenty of fluids Ok to continue Imodium AD if needed  RTC as needed or if symptoms persist or worsen Webb Silversmith, NP

## 2016-03-12 NOTE — Patient Instructions (Signed)

## 2016-03-18 ENCOUNTER — Ambulatory Visit (INDEPENDENT_AMBULATORY_CARE_PROVIDER_SITE_OTHER): Payer: Medicare Other | Admitting: *Deleted

## 2016-03-18 DIAGNOSIS — E349 Endocrine disorder, unspecified: Secondary | ICD-10-CM

## 2016-03-18 MED ORDER — TESTOSTERONE CYPIONATE 200 MG/ML IM SOLN
150.0000 mg | INTRAMUSCULAR | Status: DC
  Administered 2016-03-18 – 2017-02-03 (×7): 150 mg via INTRAMUSCULAR

## 2016-03-26 ENCOUNTER — Other Ambulatory Visit: Payer: Self-pay | Admitting: Internal Medicine

## 2016-03-26 MED ORDER — CLARITHROMYCIN 250 MG PO TABS: 250.0000 mg | ORAL_TABLET | Freq: Two times a day (BID) | ORAL | 0 refills | Status: DC

## 2016-04-01 ENCOUNTER — Ambulatory Visit (INDEPENDENT_AMBULATORY_CARE_PROVIDER_SITE_OTHER): Payer: Medicare Other | Admitting: *Deleted

## 2016-04-01 DIAGNOSIS — E291 Testicular hypofunction: Secondary | ICD-10-CM

## 2016-04-01 DIAGNOSIS — E349 Endocrine disorder, unspecified: Secondary | ICD-10-CM | POA: Diagnosis not present

## 2016-04-01 MED ORDER — TESTOSTERONE CYPIONATE 200 MG/ML IM SOLN
150.0000 mg | Freq: Once | INTRAMUSCULAR | Status: AC
  Administered 2016-04-01: 150 mg via INTRAMUSCULAR

## 2016-04-15 ENCOUNTER — Ambulatory Visit (INDEPENDENT_AMBULATORY_CARE_PROVIDER_SITE_OTHER): Payer: Medicare Other | Admitting: *Deleted

## 2016-04-15 DIAGNOSIS — E349 Endocrine disorder, unspecified: Secondary | ICD-10-CM

## 2016-04-29 ENCOUNTER — Ambulatory Visit: Payer: Medicare Other

## 2016-04-30 ENCOUNTER — Encounter: Payer: Self-pay | Admitting: Family Medicine

## 2016-04-30 ENCOUNTER — Ambulatory Visit (INDEPENDENT_AMBULATORY_CARE_PROVIDER_SITE_OTHER): Payer: Medicare Other | Admitting: Family Medicine

## 2016-04-30 VITALS — BP 132/56 | HR 106 | Temp 98.5°F | Resp 20 | Wt 246.8 lb

## 2016-04-30 DIAGNOSIS — J0101 Acute recurrent maxillary sinusitis: Secondary | ICD-10-CM

## 2016-04-30 DIAGNOSIS — E291 Testicular hypofunction: Secondary | ICD-10-CM | POA: Diagnosis not present

## 2016-04-30 MED ORDER — TESTOSTERONE CYPIONATE 200 MG/ML IM SOLN
150.0000 mg | Freq: Once | INTRAMUSCULAR | Status: AC
  Administered 2016-04-30: 150 mg via INTRAMUSCULAR

## 2016-04-30 MED ORDER — AMOXICILLIN 875 MG PO TABS: 875.0000 mg | ORAL_TABLET | Freq: Two times a day (BID) | ORAL | 0 refills | Status: DC

## 2016-04-30 NOTE — Progress Notes (Signed)
Subjective:    Patient ID: [name], male    DOB: 1946/04/10, 71 y.o.   [name]: [id number]  HPI This is a 71 yo male who presents today with cough x 1 week. Thinks it is coming from his sinuses, sputum is yellow, green and brown. No fever. Headache across top of head. Has been taking Mucinex Cold and Flu with some relief. Has been around his 46 year old great grandson every day who has had a cough and runny nose. No SOB or wheeze. Takes Zyrtec and Flonase daily.   Was seen 12/17 with acute sinusitis and prescribed azithromycin and prednisone.   He has a follow up appointment with Dr. Letta Median later this month. Blood sugars are running in 200s, "have never been under control." No exercise or stretching.   Past Medical History:  Diagnosis Date  . Allergic rhinitis due to pollen   . Chronic kidney disease, stage III (moderate) 12/19/2012  . Depression   . Diabetes mellitus ([place])   . Diverticulosis   . Erectile dysfunction associated with type 2 diabetes mellitus ([place]) 05/28/2015  . Former very heavy cigarette smoker (more than 40 per day) 10/07/2014  . GERD (gastroesophageal reflux disease)   . Gout   . Hyperlipidemia   . Hypertension   . Hypogonadism male 06/14/2012  . Iron deficiency   . Low testosterone   . Osteoarthritis   . Personal history of colonic polyps    1996   Past Surgical History:  Procedure Laterality Date  . CARDIAC CATHETERIZATION  11/2011   [facility]  . CHOLECYSTECTOMY  1980  . TOTAL HIP ARTHROPLASTY  2009   Dr. Gladstone Lighter  . TOTAL HIP ARTHROPLASTY     Family History  Problem Relation Age of Onset  . Diabetes Mother   . Diabetes Father   . Lung cancer Sister     lung  . Heart disease Maternal Grandfather   . COPD Sister   . Heart attack Sister   . Heart disease Sister    Social History  Substance Use Topics  . Smoking status: Former Smoker    Packs/day: 3.00    Years: 35.00    Types: Cigarettes    Quit date: 04/12/2000  . Smokeless tobacco: Never Used  .  Alcohol use No     Review of Systems Per HPI    Objective:   Physical Exam  Constitutional: He is oriented to person, place, and time. He appears well-developed and well-nourished. No distress.  HENT:  Head: Normocephalic and atraumatic.  Right Ear: External ear normal.  Left Ear: External ear normal.  Nose: Mucosal edema and rhinorrhea present. Right sinus exhibits maxillary sinus tenderness. Right sinus exhibits no frontal sinus tenderness. Left sinus exhibits maxillary sinus tenderness. Left sinus exhibits no frontal sinus tenderness.  Mouth/Throat: Uvula is midline and oropharynx is clear and moist.  Eyes: Conjunctivae are normal.  Neck: Normal range of motion. Neck supple.  Cardiovascular: Normal rate, regular rhythm and normal heart sounds.   Pulmonary/Chest: Effort normal and breath sounds normal.  Lymphadenopathy:    He has no cervical adenopathy.  Neurological: He is alert and oriented to person, place, and time.  Skin: Skin is warm and dry. He is not diaphoretic.  Psychiatric: He has a normal mood and affect. His behavior is normal. Judgment and thought content normal.  Vitals reviewed.     BP (!) 132/56 (BP Location: Left Arm, Patient Position: Sitting, Cuff Size: Normal)   Pulse (!) 106  Temp 98.5 F (36.9 C) (Oral)   Resp 20   Wt 246 lb 12.8 oz (111.9 kg)   SpO2 93%   BMI 34.42 kg/m  Wt Readings from Last 3 Encounters:  04/30/16 246 lb 12.8 oz (111.9 kg)  03/12/16 246 lb (111.6 kg)  02/05/16 250 lb (113.4 kg)       Assessment & Plan:  1. Acute recurrent maxillary sinusitis - likely viral in patient with sick contact and chronic allergic rhinitis - provided wait and see antibiotic if not improved in 4-6 days Patient Instructions  I think your sinus symptoms are caused by a virus If you are not better in 3-5 days, you can start the antibiotic- printed prescription provided.  Please continue your Flonase every day and your Zyrtec  Use saline nasal  spray, 2 sprays in each nostril 4-6 times a day to help moisturized your nasal passages and thin your secretions  Please try to walk and stretch at least every other day to help with your diabetes and arthritis   - amoxicillin (AMOXIL) 875 MG tablet; Take 1 tablet (875 mg total) by mouth 2 (two) times daily.  Dispense: 14 tablet; Refill: 0  2. Hypogonadism in male - testosterone injection today  3. Uncontrolled type 1 DM - he is overdue for labs, has appointment with endocrinologist later this month, will defer labs  - encouraged him to incorporate more activity into daily activities including regular walking and stretching  Clarene Reamer, FNP-BC  [facility] at [facility], [place]  04/30/2016 2:33 PM

## 2016-04-30 NOTE — Patient Instructions (Signed)
I think your sinus symptoms are caused by a virus If you are not better in 3-5 days, you can start the antibiotic- printed prescription provided.  Please continue your Flonase every day and your Zyrtec  Use saline nasal spray, 2 sprays in each nostril 4-6 times a day to help moisturized your nasal passages and thin your secretions  Please try to walk and stretch at least every other day to help with your diabetes and arthritis

## 2016-05-01 ENCOUNTER — Other Ambulatory Visit: Payer: Self-pay | Admitting: Family Medicine

## 2016-05-01 ENCOUNTER — Other Ambulatory Visit: Payer: Self-pay | Admitting: Internal Medicine

## 2016-05-01 NOTE — Telephone Encounter (Signed)
Last office visit 04/30/16 with D. Carlean Purl.  Last refilled 07/09/15 for #90 with 3 refills.  Ok to refill?

## 2016-05-05 NOTE — Progress Notes (Signed)
I reviewed the injection note, was available for consultation, and agree with documentation and plan.  Eliezer Lofts, MD [place] [facility]

## 2016-05-11 ENCOUNTER — Ambulatory Visit (INDEPENDENT_AMBULATORY_CARE_PROVIDER_SITE_OTHER): Payer: Medicare Other | Admitting: Internal Medicine

## 2016-05-11 ENCOUNTER — Telehealth: Payer: Self-pay | Admitting: Family Medicine

## 2016-05-11 ENCOUNTER — Encounter: Payer: Self-pay | Admitting: Internal Medicine

## 2016-05-11 VITALS — BP 120/64 | HR 100 | Ht 71.0 in | Wt 245.0 lb

## 2016-05-11 DIAGNOSIS — IMO0002 Reserved for concepts with insufficient information to code with codable children: Secondary | ICD-10-CM

## 2016-05-11 DIAGNOSIS — E1142 Type 2 diabetes mellitus with diabetic polyneuropathy: Secondary | ICD-10-CM | POA: Diagnosis not present

## 2016-05-11 DIAGNOSIS — E1165 Type 2 diabetes mellitus with hyperglycemia: Secondary | ICD-10-CM

## 2016-05-11 LAB — POCT GLYCOSYLATED HEMOGLOBIN (HGB A1C): HEMOGLOBIN A1C: 9.7

## 2016-05-11 NOTE — Telephone Encounter (Signed)
[facility]  Patient Name: [name]  DOB: 20-May-1945    Initial Comment Caller says her husband can't stand up well anymore, he just fell down. no injuries. He got up. BP was hi earlier 85/115 but came down to normal later    Nurse Assessment  Nurse: Harlow Mares, RN, Suanne Marker Date/Time Eilene Ghazi Time): 05/11/2016 4:10:14 PM  Confirm and document reason for call. If symptomatic, describe symptoms. ---Caller says her husband can't stand up well anymore, he just fell down. no injuries. He got up. BP was hi earlier 185/115 but came down to normal later. 137/71 now. hx of stroke. Patient reports that he feels nauseated. Has taken Zantac. Dizziness when he stands. Blood sugar is 158.  Does the patient have any new or worsening symptoms? ---Yes  Will a triage be completed? ---Yes  Related visit to physician within the last 2 weeks? ---No  Does the PT have any chronic conditions? (i.e. diabetes, asthma, etc.) ---Yes  List chronic conditions. ---hx stroke; diabetes;  Is this a behavioral health or substance abuse call? ---No     Guidelines    Guideline Title Affirmed Question Affirmed Notes  Dizziness - Lightheadedness [1] MODERATE dizziness (e.g., interferes with normal activities) AND [2] has NOT been evaluated by physician for this (Exception: dizziness caused by heat exposure, sudden standing, or poor fluid intake)    Final Disposition User   See Physician within 24 Hours Harlow Mares, RN, Suanne Marker    Comments  Appt scheduled with Dr. Phillip Heal for tomorrow at 2p at the [facility] location. Caller voiced understanding.   Referrals  REFERRED TO PCP OFFICE   Disagree/Comply: Comply

## 2016-05-11 NOTE — Telephone Encounter (Signed)
Pt has appt with Dr Damita Dunnings on 05/12/16 at 2 PM.

## 2016-05-11 NOTE — Addendum Note (Signed)
Addended by: Caprice Beaver T on: 05/11/2016 02:49 PM   Modules accepted: Orders

## 2016-05-11 NOTE — Progress Notes (Signed)
Patient ID: [name], male   DOB: May 21, 1945, 71 y.o.   [name]: [id number]  HPI: DAEKWON BESWICK is a 71 y.o.-year-old male, returning for f/u for DM2, dx 1997, insulin-dependent since 2010, uncontrolled, with complications (CKD stage 3, PN, ED, TIA).   Last visit was 1 year and 2 mo ago!   Last hemoglobin A1c was: Lab Results  Component Value Date   HGBA1C 9.4 (H) 06/05/2015   HGBA1C 9.4 12/10/2014   HGBA1C 12.2 (H) 09/10/2014   He is on: - missing many doses! - He moved his R insulin at bedtime, from before dinner since last visit!  Type of insulin Breakfast Lunch Dinner 10-11 pm  Humulin R 50 30-50  >>  50  Humulin N 50   50   Gets insulin w/o Rx From [facility].  Pt checks his sugars 2x a day: - am:  300-400s >> 168, 250-350 >> 86, 136-271, 297 >> 70, 96, 132-332 >> 71, 96, 145-356, 458 - 2h after b'fast: n/c  - before lunch: n/c >> 80, 114-289 >> n/c - 2h after lunch: n/c >> 300s >> n/c - before dinner: n/c >> 217-237 >> 70 (no lunch), 150 >> 364 - 2h after dinner: n/c - bedtime: n/c >> 197-387 >> 251-401, 512, 534 x1 (ran out of insulin) >> 114, 250-406, 461 - nighttime: 60s, wakes him up >> n/c >> 286 No lows since last visit.  he has hypoglycemia awareness at 100.  Highest sugar was 500's >> 458.  Pt's meals are (he reduced bread, uses whole wheat now): - Breakfast: 2 slices buttered toast - Lunch: ham sandwich + chips - Dinner: meat + veggies + starch - Snacks: 4-5 x a day Eats dinner: 7-7:30 pm. Bedtime 2-4 am.  - has CKD, last BUN/creatinine:  Lab Results  Component Value Date   BUN 22 (H) 12/05/2014   CREATININE 1.30 (H) 12/05/2014  Last ACR 4 on 12/12/2012. On Lisinopril 20.  - last set of lipids: Lab Results  Component Value Date   CHOL 154 06/05/2015   HDL 30.60 (L) 06/05/2015   LDLCALC 43 04/24/2009   LDLDIRECT 73.0 06/05/2015   TRIG (H) 06/05/2015    503.0 Triglyceride is over 400; calculations on Lipids are invalid.   CHOLHDL 5 06/05/2015   On Zocor 40. - last eye exam was in 10/01/2014. No DR.  - + numbness and tingling in his feet, especially at night.  I reviewed pt's medications, allergies, PMH, social hx, family hx, and changes were documented in the history of present illness. Otherwise, unchanged from my initial visit note.  ROS: Constitutional: no weight gain , no fatigue, no subjective hyperthermia/hypothermia, + nocturia  Eyes: no blurry vision, no xerophthalmia ENT: no sore throat, no nodules palpated in throat, no dysphagia/odynophagia Cardiovascular: no CP/SOB/palpitations/+ leg swelling Respiratory: no cough/no SOB/wheezing Gastrointestinal: no N/V/D/C/heartburn Musculoskeletal: no muscle/no joint aches Skin: no rashes Neurological: no tremors/numbness/tingling/dizziness  PE: BP 120/64 (BP Location: Left Arm, Patient Position: Sitting)   Pulse 100   Ht '5\' 11"'$  (1.803 m)   Wt 245 lb (111.1 kg)   BMI 34.17 kg/m  Body mass index is 34.17 kg/m. Wt Readings from Last 3 Encounters:  05/11/16 245 lb (111.1 kg)  04/30/16 246 lb 12.8 oz (111.9 kg)  03/12/16 246 lb (111.6 kg)   Constitutional: overweight.  At the beginning of the appointment, patient appeared diaphoretic and unstable on his feet. We check his sugar = 202. Pt given water and he felt better by  the end of the appt. Eyes: PERRLA, EOMI, no exophthalmos ENT: moist mucous membranes, no thyromegaly, no cervical lymphadenopathy Cardiovascular: tachycardia, RR, No MRG, + varicose veins L leg Respiratory: CTA B Gastrointestinal: abdomen soft, NT, ND, BS+ Musculoskeletal: no deformities, strength intact in all 4 Skin: moist, warm, no rashes  ASSESSMENT: 1. DM2, insulin-dependent, uncontrolled, with complications - noncompliance with visits, insulin doses, CBG checks - CKD stage 3 - PN - ED - TIA  PLAN:  1. Patient with long-standing, uncontrolled diabetes, again returning after a long absence. He is on NPH and R insulin because of price. U500  insulin was too expensive.  - Since last visit, he mentions that he is missing many doses of insulin. He also moved his regular insulin from before dinner to bedtime. We discussed that this is not good practice, and I advised him to move NPH and regular insulins both before dinner. At this point, I would not make any other changes in his regimen, but strongly advised him to take the insulin as before every meal, possibly by using alarms or putting the insulins on the table. - Last HbA1c was reviewed >> 9.4% ~1 year ago Patient Instructions  Please continue: Type of insulin Breakfast Lunch Dinner 10-11 pm  Humulin R 50 30-50 50   Humulin N 50   50   Please return in 3 months with your sugar log.   - continue checking sugars at different times of the day - check 3 times a day, rotating checks - needs a new eye exam >> advised to schedule - At today's visit, he was diaphoretic and unsteady on his feet, however, his blood pressure and blood sugars were normal. I strongly advised him to schedule an appointment with PCP for an annual physical exam and to mention this episode. - HbA1c today >> 9.7% (higher) - RTC in 3 mo  Philemon Kingdom, MD PhD [facility] Endocrinology

## 2016-05-11 NOTE — Patient Instructions (Addendum)
Please move the R and N insulins from bedtime to before dinner: Type of insulin Breakfast Lunch Dinner  Humulin R 50 30-50 50  Humulin N 50  50   Please return in 1.5 months with your sugar log.

## 2016-05-12 ENCOUNTER — Ambulatory Visit (INDEPENDENT_AMBULATORY_CARE_PROVIDER_SITE_OTHER): Payer: Medicare Other | Admitting: Family Medicine

## 2016-05-12 ENCOUNTER — Encounter: Payer: Self-pay | Admitting: Family Medicine

## 2016-05-12 VITALS — BP 122/70 | HR 82 | Wt 248.1 lb

## 2016-05-12 DIAGNOSIS — E784 Other hyperlipidemia: Secondary | ICD-10-CM | POA: Diagnosis not present

## 2016-05-12 DIAGNOSIS — R42 Dizziness and giddiness: Secondary | ICD-10-CM | POA: Diagnosis not present

## 2016-05-12 DIAGNOSIS — Z125 Encounter for screening for malignant neoplasm of prostate: Secondary | ICD-10-CM | POA: Diagnosis not present

## 2016-05-12 DIAGNOSIS — E7849 Other hyperlipidemia: Secondary | ICD-10-CM

## 2016-05-12 DIAGNOSIS — E291 Testicular hypofunction: Secondary | ICD-10-CM | POA: Diagnosis not present

## 2016-05-12 LAB — CBC WITH DIFFERENTIAL/PLATELET
Basophils Absolute: 0 10*3/uL (ref 0.0–0.1)
Basophils Relative: 0.4 % (ref 0.0–3.0)
Eosinophils Absolute: 0.2 10*3/uL (ref 0.0–0.7)
Eosinophils Relative: 1.9 % (ref 0.0–5.0)
HEMATOCRIT: 41.5 % (ref 39.0–52.0)
HEMOGLOBIN: 14.6 g/dL (ref 13.0–17.0)
LYMPHS PCT: 19 % (ref 12.0–46.0)
Lymphs Abs: 1.6 10*3/uL (ref 0.7–4.0)
MCHC: 35.1 g/dL (ref 30.0–36.0)
MCV: 88.9 fl (ref 78.0–100.0)
MONOS PCT: 10.2 % (ref 3.0–12.0)
Monocytes Absolute: 0.8 10*3/uL (ref 0.1–1.0)
NEUTROS PCT: 68.5 % (ref 43.0–77.0)
Neutro Abs: 5.7 10*3/uL (ref 1.4–7.7)
Platelets: 194 10*3/uL (ref 150.0–400.0)
RBC: 4.67 Mil/uL (ref 4.22–5.81)
RDW: 13 % (ref 11.5–15.5)
WBC: 8.3 10*3/uL (ref 4.0–10.5)

## 2016-05-12 LAB — COMPREHENSIVE METABOLIC PANEL
ALK PHOS: 73 U/L (ref 39–117)
ALT: 16 U/L (ref 0–53)
AST: 16 U/L (ref 0–37)
Albumin: 4.1 g/dL (ref 3.5–5.2)
BUN: 28 mg/dL — ABNORMAL HIGH (ref 6–23)
CALCIUM: 9.2 mg/dL (ref 8.4–10.5)
CO2: 29 mEq/L (ref 19–32)
Chloride: 93 mEq/L — ABNORMAL LOW (ref 96–112)
Creatinine, Ser: 1.94 mg/dL — ABNORMAL HIGH (ref 0.40–1.50)
GFR: 36.49 mL/min — AB (ref >60.00)
Glucose, Bld: 413 mg/dL — ABNORMAL HIGH (ref 70–99)
POTASSIUM: 5 meq/L (ref 3.5–5.1)
Sodium: 128 mEq/L — ABNORMAL LOW (ref 135–145)
TOTAL PROTEIN: 7 g/dL (ref 6.0–8.3)
Total Bilirubin: 0.7 mg/dL (ref 0.2–1.2)

## 2016-05-12 NOTE — Patient Instructions (Signed)
Don't change your meds for now.  Let me check with [facility].  Go to the lab on the way out.  We'll contact you with your lab report. Make sure to stay well hydrated.   Take care.  Glad to see you.

## 2016-05-12 NOTE — Progress Notes (Signed)
His mother was recently hospitalized with PNA and UTI per patient report.  D/w pt.  He admits to his mood being lower than normal.  Still on venlafaxine.  Still caring for his mother.  No SI/HI.  He sleep was clearly disrupted recently, when she was inpatient.    Yesterday he was lightheaded, without room spinning. He felt hot. No syncope.  This started yesterday, not an extended issue for patient. Episode lasted about 20 min max, possibly less.   Still on baseline meds.  Normal speech yesterday.  Normal ROM in the ext, no weakness.    BP was up on home checks yesterday, prior to the episode (~180/110s).  It came back down later on in the day after takin ghis baseline BP meds.    Normal BP at OV today.  He doesn't feel lightheaded now.   No CP.  No SOB.  No BLE edema.  Diarrhea last night, about 4 times last night.  Stopped in the meantime.  No blood in stool.    Sugar was 158 about the time of the episode yesterday.  His sugar normally runs higher than that, with A1c 9.1 per patient report at [facility] yesterday.    PMH and SH reviewed  ROS: Per HPI unless specifically indicated in ROS section   Meds, vitals, and allergies reviewed.   GEN: nad, alert and oriented HEENT: mucous membranes moist NECK: supple w/o LA CV: rrr. PULM: ctab, no inc wob ABD: soft, +bs, not ttp.  EXT: no edema

## 2016-05-13 ENCOUNTER — Other Ambulatory Visit: Payer: Self-pay | Admitting: Family Medicine

## 2016-05-13 ENCOUNTER — Ambulatory Visit (INDEPENDENT_AMBULATORY_CARE_PROVIDER_SITE_OTHER): Payer: Medicare Other

## 2016-05-13 ENCOUNTER — Other Ambulatory Visit (INDEPENDENT_AMBULATORY_CARE_PROVIDER_SITE_OTHER): Payer: Medicare Other

## 2016-05-13 DIAGNOSIS — E291 Testicular hypofunction: Secondary | ICD-10-CM

## 2016-05-13 DIAGNOSIS — E784 Other hyperlipidemia: Secondary | ICD-10-CM

## 2016-05-13 DIAGNOSIS — E349 Endocrine disorder, unspecified: Secondary | ICD-10-CM | POA: Diagnosis not present

## 2016-05-13 DIAGNOSIS — Z125 Encounter for screening for malignant neoplasm of prostate: Secondary | ICD-10-CM

## 2016-05-13 DIAGNOSIS — I10 Essential (primary) hypertension: Secondary | ICD-10-CM

## 2016-05-13 DIAGNOSIS — E7849 Other hyperlipidemia: Secondary | ICD-10-CM

## 2016-05-13 LAB — BASIC METABOLIC PANEL
BUN: 26 mg/dL — ABNORMAL HIGH (ref 6–23)
CALCIUM: 8.8 mg/dL (ref 8.4–10.5)
CO2: 30 mEq/L (ref 19–32)
Chloride: 95 mEq/L — ABNORMAL LOW (ref 96–112)
Creatinine, Ser: 1.6 mg/dL — ABNORMAL HIGH (ref 0.40–1.50)
GFR: 45.58 mL/min — AB (ref >60.00)
Glucose, Bld: 405 mg/dL — ABNORMAL HIGH (ref 70–99)
Potassium: 4.7 mEq/L (ref 3.5–5.1)
SODIUM: 131 meq/L — AB (ref 135–145)

## 2016-05-13 LAB — LIPID PANEL
CHOLESTEROL: 147 mg/dL (ref 0–200)
HDL: 27.4 mg/dL — AB (ref >39.00)
Total CHOL/HDL Ratio: 5

## 2016-05-13 LAB — PSA, MEDICARE: PSA: 0.75 ng/ml (ref 0.10–4.00)

## 2016-05-13 LAB — LDL CHOLESTEROL, DIRECT: LDL DIRECT: 46 mg/dL

## 2016-05-13 MED ORDER — TESTOSTERONE CYPIONATE 200 MG/ML IM SOLN
150.0000 mg | Freq: Once | INTRAMUSCULAR | Status: AC
  Administered 2016-05-13: 150 mg via INTRAMUSCULAR

## 2016-05-13 NOTE — Assessment & Plan Note (Signed)
Previously lightheaded. Not orthostatic on standing today. He has no symptoms when he stands today at the office visit. He likely had a combination of issues contributing. He has known diabetes, uncontrolled. He is on a beta blocker and that may decrease his relative awareness of relative hypoglycemia. He has hypertension that is treated, again with beta blocker. He is under significant stress caring for his mother, who was recently hospitalized. His sleep has been clearly disrupted and his diet has been affected recently with the upheaval and changes to schedule. All of that could've potentially contributed. Discussed with patient. At this point appears okay for outpatient follow-up. Check routine labs today and I will notify the primary. Patient agrees. He has no focal new neurologic symptoms that are concerning for CVA and he has no chest pain.   He also admits that his mood has been worse recently. No suicidal or homicidal intent. He is due for routine labs and his testosterone level to be checked. I did not change any medications today. I will route this to the primary Doc for notification and potential consideration in change of medication.  >25 minutes spent in face to face time with patient, >50% spent in counselling or coordination of care.

## 2016-05-17 LAB — TESTOS,TOTAL,FREE AND SHBG (FEMALE)
SEX HORMONE BINDING GLOB.: 14 nmol/L — AB (ref 22–77)
Testosterone, Free: 39.4 pg/mL (ref 30.0–135.0)
Testosterone,Total,LC/MS/MS: 179 ng/dL — ABNORMAL LOW (ref 250–1100)

## 2016-05-20 ENCOUNTER — Other Ambulatory Visit: Payer: Self-pay | Admitting: Family Medicine

## 2016-05-27 ENCOUNTER — Ambulatory Visit (INDEPENDENT_AMBULATORY_CARE_PROVIDER_SITE_OTHER): Payer: Medicare Other

## 2016-05-27 DIAGNOSIS — E349 Endocrine disorder, unspecified: Secondary | ICD-10-CM | POA: Diagnosis not present

## 2016-05-27 MED ORDER — TESTOSTERONE CYPIONATE 200 MG/ML IM SOLN
150.0000 mg | Freq: Once | INTRAMUSCULAR | Status: AC
  Administered 2016-05-27: 150 mg via INTRAMUSCULAR

## 2016-06-02 ENCOUNTER — Encounter: Payer: Self-pay | Admitting: Family Medicine

## 2016-06-02 ENCOUNTER — Ambulatory Visit (INDEPENDENT_AMBULATORY_CARE_PROVIDER_SITE_OTHER): Payer: Medicare Other | Admitting: Family Medicine

## 2016-06-02 VITALS — BP 130/60 | HR 87 | Temp 97.5°F | Ht 71.0 in | Wt 242.5 lb

## 2016-06-02 DIAGNOSIS — N183 Chronic kidney disease, stage 3 unspecified: Secondary | ICD-10-CM

## 2016-06-02 DIAGNOSIS — Z8673 Personal history of transient ischemic attack (TIA), and cerebral infarction without residual deficits: Secondary | ICD-10-CM

## 2016-06-02 DIAGNOSIS — E1142 Type 2 diabetes mellitus with diabetic polyneuropathy: Secondary | ICD-10-CM

## 2016-06-02 DIAGNOSIS — F3341 Major depressive disorder, recurrent, in partial remission: Secondary | ICD-10-CM

## 2016-06-02 DIAGNOSIS — F0280 Dementia in other diseases classified elsewhere without behavioral disturbance: Secondary | ICD-10-CM

## 2016-06-02 DIAGNOSIS — E1165 Type 2 diabetes mellitus with hyperglycemia: Secondary | ICD-10-CM

## 2016-06-02 DIAGNOSIS — IMO0002 Reserved for concepts with insufficient information to code with codable children: Secondary | ICD-10-CM

## 2016-06-02 LAB — BASIC METABOLIC PANEL
BUN: 22 mg/dL (ref 6–23)
CHLORIDE: 96 meq/L (ref 96–112)
CO2: 29 mEq/L (ref 19–32)
CREATININE: 1.54 mg/dL — AB (ref 0.40–1.50)
Calcium: 9.4 mg/dL (ref 8.4–10.5)
GFR: 47.63 mL/min — ABNORMAL LOW (ref >60.00)
Glucose, Bld: 346 mg/dL — ABNORMAL HIGH (ref 70–99)
POTASSIUM: 3.9 meq/L (ref 3.5–5.1)
Sodium: 131 mEq/L — ABNORMAL LOW (ref 135–145)

## 2016-06-02 MED ORDER — BUPROPION HCL ER (SR) 150 MG PO TB12: 150.0000 mg | ORAL_TABLET | Freq: Two times a day (BID) | ORAL | 5 refills | Status: DC

## 2016-06-02 NOTE — Progress Notes (Signed)
Dr. Frederico Hamman T. Hansel Devan, MD, [facility] [place], [address] Phone: [phone] Fax: [phone]  06/02/2016  Patient: [name], [name]: [id number], DOB: 11-04-1945, 71 y.o.  Primary Physician:  [name], MD   Chief Complaint  Patient presents with  . Follow-up    Labs   Subjective:   [name] is a 71 y.o. very pleasant male patient who presents with the following:  Obese gentleman, long-standing diabetes that is poorly controlled currently seeing endocrinology with hypertension, hyperlipidemia and chronic kidney disease stage III, history of TIA, sleep apnea, long smoking history who presents primarily in follow-up for some elevation in his creatinine recently when he saw one of my partners.  The patient also wanted to follow-up on his testosterone levels, and he raises a primary concern of some forgetfulness and concerned that he may be developing dementia.  F/u kidney function Most recent creatinine is 1.54 with a GFR of 48.  At the height of its worse, creatinine was 1.94 and GFR was 36.  Even back to 2014 the patient has had creatinines up to 1.7 and.  Somewhere from 1.1-1.5 at baseline. 2012, Cr 1.5.  DM - managed by endocrinology. He admits that he is not particularly compliant and sometimes forgets to take his insulin.  Test Free testosterone is 46, which is normal for age.  Dep? He also thinks that he may be more depressed compared to how he has been recently. Sleeping more Decreased interest Low energy No SI or HI More irritable.   He also tells me he is forgetting things routinely and readily.  He is not getting lost.  Family members have noticed this and been concerned.  He does not recall a specific acute or inciting event.  Past Medical History, Surgical History, Social History, Family History, Problem List, Medications, and Allergies have been reviewed and updated if  relevant.  Patient Active Problem List   Diagnosis Date Noted  . Former very heavy cigarette smoker (more than 40 per day) 10/07/2014    Priority: Medium  . Chronic kidney disease, stage III (moderate) 12/19/2012    Priority: Medium  . OSA (obstructive sleep apnea) 03/09/2011    Priority: Medium  . Diabetic neuropathy ([place]) 12/29/2010    Priority: Medium  . Hyperlipidemia LDL goal <70 06/13/2008    Priority: Medium  . Major depressive disorder, recurrent episode, in partial remission ([place]) 06/13/2008    Priority: Medium  . Essential hypertension 06/13/2008    Priority: Medium  . Lightheaded 05/12/2016  . Uncontrolled type 2 diabetes mellitus with peripheral neuropathy ([place]) 05/11/2016  . Rib pain on right side 10/10/2015  . Erectile dysfunction associated with type 2 diabetes mellitus ([place]) 05/28/2015  . Hypogonadism male 06/14/2012  . Obesity (BMI 30-39.9) 09/27/2011  . History of smoking 09/16/2010  . Family history of MI (myocardial infarction) 09/16/2010  . HIP REPLACEMENT, RIGHT, HX OF 02/05/2010  . GOUT 06/13/2008  . ALLERGIC RHINITIS 06/13/2008  . GERD 06/13/2008  . OSTEOARTHRITIS 06/13/2008    Past Medical History:  Diagnosis Date  . Allergic rhinitis due to pollen   . Chronic kidney disease, stage III (moderate) 12/19/2012  . Depression   . Diabetes mellitus ([place])   . Diverticulosis   . Erectile dysfunction associated with type 2 diabetes mellitus ([place]) 05/28/2015  . Former very heavy cigarette smoker (more than 40 per day) 10/07/2014  . GERD (gastroesophageal reflux disease)   . Gout   .  Hyperlipidemia   . Hypertension   . Hypogonadism male 06/14/2012  . Iron deficiency   . Low testosterone   . Osteoarthritis   . Personal history of colonic polyps    1996    Past Surgical History:  Procedure Laterality Date  . CARDIAC CATHETERIZATION  11/2011   [facility]  . CHOLECYSTECTOMY  1980  . TOTAL HIP ARTHROPLASTY  2009   Dr. Gladstone Lighter  . TOTAL HIP ARTHROPLASTY       Social History   Social History  . Marital status: Married    Spouse name: N/A  . Number of children: N/A  . 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 Smoking status: Former Smoker    Packs/day: 3.00    Years: 35.00    Types: Cigarettes    Quit date: 04/12/2000  . Smokeless tobacco: Never Used  . Alcohol use No  . Drug use: No  . Sexual activity: No   Other Topics Concern  . Not on file   Social History Narrative   No regular exercise   Caffeine use: yes, dt coke    Family History  Problem Relation Age of Onset  . Diabetes Mother   . Diabetes Father   . Lung cancer Sister     lung  . Heart disease Maternal Grandfather   . COPD Sister   . Heart attack Sister   . Heart disease Sister     Allergies  Allergen Reactions  . Tetracycline Itching and Rash    Medication list reviewed and updated in full in [facility].   GEN: No acute illnesses, no fevers, chills. GI: No n/v/d, eating normally Pulm: No SOB Interactive and getting along well at home.  Otherwise, ROS is as per the HPI.  Objective:   BP 130/60   Pulse 87   Temp 97.5 F (36.4 C) (Oral)   Ht '5\' 11"'$  (1.803 m)   Wt 242 lb 8 oz (110 kg)   BMI 33.82 kg/m   GEN: WDWN, NAD, Non-toxic, A & O x 3 HEENT: Atraumatic, Normocephalic. Neck supple. No masses, No LAD. Ears and Nose: No external deformity. CV: RRR, No M/G/R. No JVD. No thrill. No extra heart sounds. PULM: CTA B, no wheezes, crackles, rhonchi. No retractions. No resp. distress. No accessory muscle use. EXTR: No c/c/e NEURO Normal gait.  PSYCH: Normally interactive. Conversant. Not depressed or anxious appearing.  Calm demeanor.   Neuro: CN 2-12 grossly intact. PERRLA. EOMI. Sensation intact throughout. Str 5/5 all extremities. DTR 2+. No clonus. A and o x 4. Romberg neg. Finger nose neg. Heel -shin neg.    Laboratory and Imaging  Data: Results for orders placed or performed in visit on 51/70/01  Basic metabolic panel  Result Value Ref Range   Sodium 131 (L) 135 - 145 mEq/L   Potassium 3.9 3.5 - 5.1 mEq/L   Chloride 96 96 - 112 mEq/L   CO2 29 19 - 32 mEq/L   Glucose, Bld 346 (H) 70 - 99 mg/dL   BUN 22 6 - 23 mg/dL   Creatinine, Ser 1.54 (H) 0.40 - 1.50 mg/dL   Calcium 9.4 8.4 - 10.5 mg/dL   GFR 47.63 (L) >60.00 mL/min    Lab Results  Component Value Date   WBC 8.3 05/12/2016   HGB 14.6 05/12/2016   HCT 41.5 05/12/2016   PLT 194.0 05/12/2016  GLUCOSE 346 (H) 06/02/2016   CHOL 147 05/13/2016   TRIG (H) 05/13/2016    698.0 Triglyceride is over 400; calculations on Lipids are invalid.   HDL 27.40 (L) 05/13/2016   LDLDIRECT 46.0 05/13/2016   LDLCALC 43 04/24/2009   ALT 16 05/12/2016   AST 16 05/12/2016   NA 131 (L) 06/02/2016   K 3.9 06/02/2016   CL 96 06/02/2016   CREATININE 1.54 (H) 06/02/2016   BUN 22 06/02/2016   CO2 29 06/02/2016   TSH 1.31 10/18/2014   PSA 0.75 05/13/2016   INR 1.02 12/05/2014   HGBA1C 9.7 05/11/2016   MICROALBUR 4.0 (H) 12/12/2013     CLINICAL DATA:  71 year old male with headache since yesterday and expressive aphasia. Visual changes. Initial encounter.  EXAM: MRI HEAD WITHOUT CONTRAST  TECHNIQUE: Multiplanar, multiecho pulse sequences of the brain and surrounding structures were obtained without intravenous contrast.  COMPARISON:  Head CT without contrast 1804 hr today, and earlier  FINDINGS: Study is intermittently, mildly degraded by motion artifact despite repeated imaging attempts.  Cerebral volume is within normal limits for age. No restricted diffusion to suggest acute infarction. No midline shift, mass effect, evidence of mass lesion, ventriculomegaly, extra-axial collection or acute intracranial hemorrhage. Cervicomedullary junction and pituitary are within normal limits. Major intracranial vascular flow voids are within normal  limits.  Minimal to mild for age scattered small foci of cerebral white matter T2 and FLAIR hyperintensity, mostly in the left hemisphere. No cortical encephalomalacia or definite chronic cerebral blood products. Deep gray matter nuclei, brainstem, and cerebellum are within normal limits.  Visible internal auditory structures appear normal. Mastoids and paranasal sinuses are clear. Orbit and scalp soft tissues appear normal. Normal bone marrow signal. Negative visualized cervical spine.  IMPRESSION: 1.  No acute intracranial abnormality. 2. Mild for age nonspecific cerebral white matter signal changes, most commonly due to chronic small vessel disease.   Electronically Signed   By: Genevie Ann M.D.   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)  Multiple ongoing issues.  Poorly controlled diabetes, compliances likely playing an issue here I appreciate endocrinology's assistance.  Chronic kidney disease stage III, some fluctuation of creatinine, but returned to baseline.  Depression has worsened.  He is at maximum dose of Effexor currently.  I'm going to add Wellbutrin with close follow-up.  Follow-up in 6 weeks.  The patient and his family have concern about potential ongoing dementia.  He had a reassuring MRI of his brain less than 18 months ago.  It is certainly possible that he could be starting to develop some early dementia or Alzheimer's disease.  They have requested a neurological consultation, which is entirely appropriate.  Follow-up: 6  weeks  Meds ordered this encounter  Medications  . ranitidine (ZANTAC 75) 75 MG tablet    Sig: Take 225 mg by mouth daily.  [name] Kitchen buPROPion (WELLBUTRIN SR) 150 MG 12 hr tablet    Sig: Take 1 tablet (150 mg total) by mouth 2 (two) times daily.    Dispense:  60 tablet    Refill:  5   Medications Discontinued During This Encounter  Medication Reason  . traMADol (ULTRAM) 50 MG tablet Completed Course  . tamsulosin (FLOMAX) 0.4 MG CAPS capsule Completed Course  . mometasone (NASONEX) 50 MCG/ACT nasal spray Completed Course  . Melatonin 3 MG TABS Completed Course  . HYDROcodone-acetaminophen (NORCO/VICODIN) 5-325 MG tablet Completed Course  . famotidine (PEPCID) 10 MG tablet Completed Course  . b complex vitamins tablet Completed Course   Orders Placed This Encounter  Procedures  . Basic metabolic panel  . Ambulatory referral to Neurology    Signed,  Frederico Hamman T. Tajae Rybicki, MD   Allergies as of 06/02/2016      Reactions   Tetracycline Itching, Rash      Medication List       Accurate as of 06/02/16 11:59 PM. Always use your most recent med list.          ampicillin 500 MG capsule Commonly known as:  PRINCIPEN   aspirin 81 MG chewable tablet Chew 81 mg by mouth at bedtime.   atenolol 100 MG tablet Commonly known as:  TENORMIN TAKE ONE TABLET BY MOUTH ONCE DAILY.   buPROPion 150 MG 12 hr tablet Commonly known as:  WELLBUTRIN SR Take 1 tablet (150 mg total) by mouth 2 (two) times daily.   cholecalciferol 1000 units tablet Commonly known as:  VITAMIN D Take 1,000 Units by mouth daily.   doxazosin 8 MG tablet Commonly known as:  CARDURA TAKE ONE TABLET BY MOUTH AT BEDTIME   fluticasone 50 MCG/ACT nasal spray Commonly known as:  FLONASE INHALE 2 SPRAYS INTO EACH NOSTRIL EVERY DAY AS NEEDED   gabapentin 300 MG capsule Commonly known as:  NEURONTIN Take 1 capsule (300 mg total) by mouth 2 (two) times daily.   insulin NPH Human 100 UNIT/ML injection Commonly known  as:  HUMULIN N,NOVOLIN N Inject 0.5 mLs (50 Units total) into the skin 3 (three) times daily.   insulin regular 250 units/2.53m (100 units/mL) injection Commonly known as:  NOVOLIN R,HUMULIN R Inject up to 150 units a day as advised   lisinopril-hydrochlorothiazide 20-12.5 MG tablet Commonly known as:  PRINZIDE,ZESTORETIC TAKE ONE TABLET BY MOUTH ONCE DAILY.   sildenafil 20 MG tablet Commonly known as:  REVATIO Generic Revatio / Sildanefil 20 mg. 2 - 5 tabs 30 mins prior to intercourse.   simvastatin 40 MG tablet Commonly known as:  ZOCOR TAKE 1 TABLET BY MOUTH AT BEDTIME *NEED OFFICE VISIT   venlafaxine XR 75 MG 24 hr capsule Commonly known as:  EFFEXOR-XR TAKE THREE (3) CAPSULES BY MOUTH DAILY   ZANTAC 75 75 MG tablet Generic drug:  ranitidine Take 225 mg by mouth daily.

## 2016-06-02 NOTE — Progress Notes (Signed)
Pre visit review using our clinic review tool, if applicable. No additional management support is needed unless otherwise documented below in the visit note. 

## 2016-06-10 ENCOUNTER — Ambulatory Visit (INDEPENDENT_AMBULATORY_CARE_PROVIDER_SITE_OTHER): Payer: Medicare Other

## 2016-06-10 DIAGNOSIS — E349 Endocrine disorder, unspecified: Secondary | ICD-10-CM

## 2016-06-10 MED ORDER — TESTOSTERONE CYPIONATE 200 MG/ML IM SOLN
150.0000 mg | Freq: Once | INTRAMUSCULAR | Status: AC
  Administered 2016-06-10: 150 mg via INTRAMUSCULAR

## 2016-06-13 ENCOUNTER — Other Ambulatory Visit: Payer: Self-pay | Admitting: Family Medicine

## 2016-06-14 ENCOUNTER — Other Ambulatory Visit: Payer: Self-pay | Admitting: Family Medicine

## 2016-06-14 NOTE — Telephone Encounter (Signed)
Last office visit 06/02/2016.  Last refilled 12/25/2015 for 10 ml?? Ok to refill?

## 2016-06-14 NOTE — Telephone Encounter (Signed)
Rx faxed to [facility] at [phone].

## 2016-06-23 ENCOUNTER — Ambulatory Visit: Payer: Medicare Other

## 2016-06-23 DIAGNOSIS — H00032 Abscess of right lower eyelid: Secondary | ICD-10-CM | POA: Diagnosis not present

## 2016-06-23 DIAGNOSIS — H353131 Nonexudative age-related macular degeneration, bilateral, early dry stage: Secondary | ICD-10-CM | POA: Diagnosis not present

## 2016-06-23 DIAGNOSIS — H40032 Anatomical narrow angle, left eye: Secondary | ICD-10-CM | POA: Diagnosis not present

## 2016-06-23 DIAGNOSIS — H40031 Anatomical narrow angle, right eye: Secondary | ICD-10-CM | POA: Diagnosis not present

## 2016-06-23 DIAGNOSIS — H25813 Combined forms of age-related cataract, bilateral: Secondary | ICD-10-CM | POA: Diagnosis not present

## 2016-06-23 LAB — HM DIABETES EYE EXAM

## 2016-06-24 ENCOUNTER — Ambulatory Visit: Payer: Medicare Other | Admitting: Neurology

## 2016-06-24 ENCOUNTER — Telehealth: Payer: Self-pay | Admitting: *Deleted

## 2016-06-24 ENCOUNTER — Ambulatory Visit: Payer: Medicare Other

## 2016-06-24 NOTE — Telephone Encounter (Signed)
Canceled new patient appt same day due to sickness.

## 2016-06-29 ENCOUNTER — Encounter: Payer: Self-pay | Admitting: Neurology

## 2016-06-29 ENCOUNTER — Ambulatory Visit (INDEPENDENT_AMBULATORY_CARE_PROVIDER_SITE_OTHER): Payer: Medicare Other | Admitting: Neurology

## 2016-06-29 VITALS — BP 147/67 | HR 80 | Ht 71.0 in | Wt 242.5 lb

## 2016-06-29 DIAGNOSIS — G4733 Obstructive sleep apnea (adult) (pediatric): Secondary | ICD-10-CM | POA: Diagnosis not present

## 2016-06-29 DIAGNOSIS — E1142 Type 2 diabetes mellitus with diabetic polyneuropathy: Secondary | ICD-10-CM | POA: Diagnosis not present

## 2016-06-29 DIAGNOSIS — E1165 Type 2 diabetes mellitus with hyperglycemia: Secondary | ICD-10-CM | POA: Diagnosis not present

## 2016-06-29 DIAGNOSIS — F09 Unspecified mental disorder due to known physiological condition: Secondary | ICD-10-CM | POA: Diagnosis not present

## 2016-06-29 DIAGNOSIS — G308 Other Alzheimer's disease: Secondary | ICD-10-CM | POA: Diagnosis not present

## 2016-06-29 DIAGNOSIS — IMO0002 Reserved for concepts with insufficient information to code with codable children: Secondary | ICD-10-CM

## 2016-06-29 MED ORDER — MEMANTINE HCL 10 MG PO TABS: 10.0000 mg | ORAL_TABLET | Freq: Two times a day (BID) | ORAL | 11 refills | Status: DC

## 2016-06-29 MED ORDER — DONEPEZIL HCL 10 MG PO TABS
10.0000 mg | ORAL_TABLET | Freq: Every day | ORAL | 11 refills | Status: DC
Start: 2016-06-29 — End: 2017-06-06

## 2016-06-29 NOTE — Progress Notes (Signed)
PATIENT: [name] DOB: [birth year]  Chief Complaint  Patient presents with  . Memory Loss    MMSE 27/30 - 13 animals.  He is concerned about his declining short term memory.  Also, reports two headache days per week.  He has been using aspirin that only sometimes helps the pain.  [name] Kitchen PCP    [name], MD     HISTORICAL  [name] is a 71 years old right-handed male, seen in refer by his primary care doctor Frederico Hamman Copland for evaluation of memory loss, initial evaluation was on June 29 2016.  He had a history of hypertension, hyperlipidemia, type 2 diabetes, insulin-dependent since 2010, depression, recently Wellbutrin was added on in January for better control of his depression.  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  His mother suffered Alzheimer's disease since age 26, now lives with him, is 71 years old,  He noted to have gradual onset memory trouble around 2015, while driving, he sometimes make wrong exit, tends to repeat questions, difficulty with multitasking.  He has obstructive sleep apnea, supposed to use CPAP machine but not complying sometimes, complains of difficulty sleeping, fatigue during daytime, he baby his 31-year-old grand child, but does not exercise regularly.  I have personally reviewed MRI brain in August 2016, MRI was ordered for acute onset of fascia which last for 1 day, mild generalized atrophy especially at perisylvian fissure, no acute abnormality, no stroke noticed.  Review of the laboratory evaluation in 2018, elevated glucose 346, creatinine 1.5 4, LDL 46, normal CBC, hemoglobin of 14 point 6, A1c was elevated 9.7, it was up to 12 point 2 in 2016, negative hepatitis C panel,  REVIEW OF SYSTEMS: Full 14 system review of systems performed and notable only for memory loss, confusion, headaches, depression, decreased energy, running nose, impotence, draining  ear  ALLERGIES: Allergies  Allergen Reactions  . Tetracycline Itching and Rash    HOME MEDICATIONS: Current Outpatient Prescriptions  Medication Sig Dispense Refill  . ampicillin (PRINCIPEN) 500 MG capsule     . aspirin 81 MG chewable tablet Chew 81 mg by mouth at bedtime.     [name] Kitchen atenolol (TENORMIN) 100 MG tablet TAKE ONE TABLET BY MOUTH ONCE DAILY. 90 tablet 1  . buPROPion (WELLBUTRIN SR) 150 MG 12 hr tablet Take 1 tablet (150 mg total) by mouth 2 (two) times daily. 60 tablet 5  . cholecalciferol (VITAMIN D) 1000 UNITS tablet Take 1,000 Units by mouth daily.      [name] Kitchen doxazosin (CARDURA) 8 MG tablet TAKE ONE TABLET BY MOUTH AT BEDTIME 90 tablet 3  . fluticasone (FLONASE) 50 MCG/ACT nasal spray INHALE 2 SPRAYS INTO EACH NOSTRIL EVERY DAY AS NEEDED 16 g 5  . gabapentin (NEURONTIN) 300 MG capsule Take 1 capsule (300 mg total) by mouth 2 (two) times daily. 90 capsule 3  . insulin NPH Human (HUMULIN N,NOVOLIN N) 100 UNIT/ML injection Inject 0.5 mLs (50 Units total) into the skin 3 (three) times daily. 30 mL 1  . insulin regular (NOVOLIN R,HUMULIN R) 100 units/mL injection Inject up to 150 units a day as advised 40 mL 1  . lisinopril-hydrochlorothiazide (PRINZIDE,ZESTORETIC) 20-12.5 MG tablet TAKE ONE TABLET BY MOUTH ONCE DAILY. 90 tablet 1  . ranitidine (ZANTAC 75) 75 MG tablet Take 225 mg by mouth daily.    . sildenafil (REVATIO) 20 MG tablet Generic Revatio / Sildanefil 20  mg. 2 - 5 tabs 30 mins prior to intercourse. 10 tablet prn  . simvastatin (ZOCOR) 40 MG tablet TAKE ONE TABLET BY MOUTH AT BEDTIME 30 tablet 11  . testosterone cypionate (DEPOTESTOSTERONE CYPIONATE) 200 MG/ML injection INJECT 0.75MLS ('150MG'$ ) INTO THE MUSCLE 10 mL 1  . venlafaxine XR (EFFEXOR-XR) 75 MG 24 hr capsule TAKE THREE (3) CAPSULES BY MOUTH DAILY 270 capsule 1   Current Facility-Administered Medications  Medication Dose Route Frequency Provider Last Rate Last Dose  . testosterone cypionate (DEPOTESTOSTERONE  CYPIONATE) injection 150 mg  150 mg Intramuscular Q14 Days [name], MD   150 mg at 04/15/16 1526    PAST MEDICAL HISTORY: Past Medical History:  Diagnosis Date  . Allergic rhinitis due to pollen   . Chronic kidney disease, stage III (moderate) 12/19/2012  . Depression   . Diabetes mellitus ([place])   . Diverticulosis   . Erectile dysfunction associated with type 2 diabetes mellitus ([place]) 05/28/2015  . Former very heavy cigarette smoker (more than 40 per day) 10/07/2014  . GERD (gastroesophageal reflux disease)   . Gout   . Headache   . Hyperlipidemia   . Hypertension   . Hypogonadism male 06/14/2012  . Iron deficiency   . Low testosterone   . Memory loss   . Osteoarthritis   . Personal history of colonic polyps    1996    PAST SURGICAL HISTORY: Past Surgical History:  Procedure Laterality Date  . CARDIAC CATHETERIZATION  11/2011   [facility]  . CHOLECYSTECTOMY  1980  . TOTAL HIP ARTHROPLASTY  2009   Dr. Gladstone Lighter  . TOTAL HIP ARTHROPLASTY      FAMILY HISTORY: Family History  Problem Relation Age of Onset  . Diabetes Mother   . Alzheimer's disease Mother   . Diabetes Father   . Lung cancer Father     Age 44  . Stroke Father   . Heart attack Father   . Lung cancer Sister     lung  . Heart disease Maternal Grandfather   . COPD Sister   . Heart attack Sister   . Heart disease Sister     SOCIAL HISTORY:  Social History   Social History  . Marital status: Married    Spouse name: N/A  . Number of children: 4  . 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 Smoking status: Former Smoker    Packs/day: 3.00    Years: 35.00    Types: Cigarettes    Quit date: 04/12/2000  . Smokeless tobacco: Never Used  . Alcohol use No  . Drug use: No  . Sexual activity: No   Other Topics Concern  . Not on file   Social History Narrative   No regular exercise   Caffeine use: yes, dt  coke   Lives at home with his wife and his mother lives with him.   Right-handed.        PHYSICAL EXAM   Vitals:   06/29/16 1132  BP: (!) 147/67  Pulse: 80  Weight: 242 lb 8 oz (110 kg)  Height: '5\' 11"'$  (1.803 m)    Not recorded      Body mass index is 33.82 kg/m.  PHYSICAL EXAMNIATION:  Gen: NAD, conversant, well nourised, obese, well groomed  Cardiovascular: Regular rate rhythm, no peripheral edema, warm, nontender. Eyes: Conjunctivae clear without exudates or hemorrhage Neck: Supple, no carotid bruits. Pulmonary: Clear to auscultation bilaterally   NEUROLOGICAL EXAM:  MENTAL STATUS: Speech:    Speech is normal; fluent and spontaneous with normal comprehension.  Cognition:Mini-Mental Status Examination 28/30, animal naming 13     Orientation to time, place and person     recent and remote memory: He missed one out of 3 recall      Attention span and concentration: Has difficulty' spelling world backwards     Normal Language, naming, repeating,spontaneous speech     Fund of knowledge   CRANIAL NERVES: CN II: Visual fields are full to confrontation. Fundoscopic exam is normal with sharp discs and no vascular changes. Pupils are round equal and briskly reactive to light. CN III, IV, VI: extraocular movement are normal. No ptosis. CN V: Facial sensation is intact to pinprick in all 3 divisions bilaterally. Corneal responses are intact.  CN VII: Face is symmetric with normal eye closure and smile. CN VIII: Hearing is normal to rubbing fingers CN IX, X: Palate elevates symmetrically. Phonation is normal. CN XI: Head turning and shoulder shrug are intact CN XII: Tongue is midline with normal movements and no atrophy.  MOTOR: There is no pronator drift of out-stretched arms. Muscle bulk and tone are normal. Muscle strength is normal.  REFLEXES: Reflexes are 2+ and symmetric at the biceps, triceps, knees, and ankles. Plantar responses are  flexor.  SENSORY: Mildly length dependent decreased to light touch, pinprick to ankle level COORDINATION: Rapid alternating movements and fine finger movements are intact. There is no dysmetria on finger-to-nose and heel-knee-shin.    GAIT/STANCE: Posture is normal. Gait is steady with normal steps, base, arm swing, and turning. Heel and toe walking are normal. Tandem gait is normal.  Romberg is absent.   DIAGNOSTIC DATA (LABS, IMAGING, TESTING) - I reviewed patient records, labs, notes, testing and imaging myself where available.   ASSESSMENT AND PLAN  [name] is a 71 y.o. 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 Ph.D.  [facility] [address] [place], [place] [address] Ph: [phone] Fax: [phone]  CC: [name], MD

## 2016-06-30 ENCOUNTER — Ambulatory Visit (INDEPENDENT_AMBULATORY_CARE_PROVIDER_SITE_OTHER): Payer: Medicare Other | Admitting: Family Medicine

## 2016-06-30 DIAGNOSIS — E349 Endocrine disorder, unspecified: Secondary | ICD-10-CM | POA: Diagnosis not present

## 2016-06-30 LAB — TSH: TSH: 1.48 u[IU]/mL (ref 0.450–4.500)

## 2016-06-30 LAB — VITAMIN B12: Vitamin B-12: 498 pg/mL (ref 232–1245)

## 2016-06-30 NOTE — Progress Notes (Signed)
Med administered.

## 2016-07-05 ENCOUNTER — Ambulatory Visit (INDEPENDENT_AMBULATORY_CARE_PROVIDER_SITE_OTHER): Payer: Medicare Other | Admitting: Internal Medicine

## 2016-07-05 ENCOUNTER — Encounter: Payer: Self-pay | Admitting: Internal Medicine

## 2016-07-05 VITALS — BP 120/62 | HR 72 | Wt 240.0 lb

## 2016-07-05 DIAGNOSIS — IMO0002 Reserved for concepts with insufficient information to code with codable children: Secondary | ICD-10-CM

## 2016-07-05 DIAGNOSIS — E1165 Type 2 diabetes mellitus with hyperglycemia: Secondary | ICD-10-CM

## 2016-07-05 DIAGNOSIS — E1142 Type 2 diabetes mellitus with diabetic polyneuropathy: Secondary | ICD-10-CM

## 2016-07-05 MED ORDER — INSULIN REGULAR HUMAN (CONC) 500 UNIT/ML ~~LOC~~ SOLN: SUBCUTANEOUS | 5 refills | Status: DC

## 2016-07-05 NOTE — Progress Notes (Signed)
Patient ID: [name], male   DOB: 1945-04-16, 71 y.o.   [name]: [id number]  HPI: [name] is a 71 y.o.-year-old male, returning for f/u for DM2, dx 1997, insulin-dependent since 2010, uncontrolled, with complications (CKD stage 3, PN, ED, TIA). Last visit 1.5 mo ago.  Last hemoglobin A1c was: Lab Results  Component Value Date   HGBA1C 9.7 05/11/2016   HGBA1C 9.4 (H) 06/05/2015   HGBA1C 9.4 12/10/2014   At last visit, he was: - missing many doses! - He moved his R insulin at bedtime, from before dinner   We changed to: Type of insulin Breakfast Lunch Dinner 10-11 pm  Humulin R 50 30-50 50   Humulin N 50   50 (forgets this once a week)   Gets insulin w/o Rx From [facility].  Pt checks his sugars 2x a day >> still very high and fluctuating: - am:  86, 136-271, 297 >> 70, 96, 132-332 >> 71, 96, 145-356, 458 >> 100-407, 541 - 2h after b'fast: n/c  - before lunch: n/c >> 80, 114-289 >> n/c - 2h after lunch: n/c >> 300s >> n/c - before dinner: n/c >> 217-237 >> 70 (no lunch), 150 >> 364 >> 198, 335 - 2h after dinner: n/c - bedtime:251-401, 512, 534 x1 (ran out of insulin) >> 114, 250-406, 461 >> 208-477 - nighttime: 60s, wakes him up >> n/c >> 286 No lows since last visit.  he has hypoglycemia awareness at 100.  Highest sugar was 500's >> 458.  Pt's meals are (stopped meat) - Breakfast: 3 pancakes with diet syrup - Lunch: tuna or cheese sandwich + chips - Dinner: veggies + starch - Snacks: 4-5 x a day Eats dinner: 7-7:30 pm. Bedtime 2-4 am.  - has CKD, last BUN/creatinine:  Lab Results  Component Value Date   BUN 22 06/02/2016   CREATININE 1.54 (H) 06/02/2016  Last ACR 4 on 12/12/2012. On Lisinopril 20.  - last set of lipids: Lab Results  Component Value Date   CHOL 147 05/13/2016   HDL 27.40 (L) 05/13/2016   LDLCALC 43 04/24/2009   LDLDIRECT 46.0 05/13/2016   TRIG (H) 05/13/2016    698.0 Triglyceride is over 400; calculations on Lipids are invalid.   CHOLHDL 5  05/13/2016  On Zocor 40. - last eye exam was in 06/2016 [facility]. No DR. Cataracts >> will have surgery. - + numbness and tingling in his feet, especially at night.  I reviewed pt's medications, allergies, PMH, social hx, family hx, and changes were documented in the history of present illness. Otherwise, unchanged from my initial visit note.  Started Wellbutrin + Namenda + Aricept << Alzheimer dementia.  ROS: Constitutional: no weight gain , no fatigue, no subjective hyperthermia/hypothermia Eyes: no blurry vision, no xerophthalmia ENT: no sore throat, no nodules palpated in throat, no dysphagia/odynophagia Cardiovascular: no CP/SOB/palpitations/+ leg swelling Respiratory: no cough/no SOB/wheezing Gastrointestinal: no N/V/+ D/no C/heartburn Musculoskeletal: no muscle/no joint aches Skin: no rashes Neurological: no tremors/numbness/tingling/dizziness + diff. With erections  PE: BP 120/62 (BP Location: Left Arm, Patient Position: Sitting)   Pulse 72   Wt 240 lb (108.9 kg)   SpO2 97%   BMI 33.47 kg/m  Body mass index is 33.47 kg/m. Wt Readings from Last 3 Encounters:  07/05/16 240 lb (108.9 kg)  06/29/16 242 lb 8 oz (110 kg)  06/02/16 242 lb 8 oz (110 kg)   Constitutional: overweight.  Eyes: PERRLA, EOMI, no exophthalmos ENT: moist mucous membranes, no thyromegaly,  no cervical lymphadenopathy Cardiovascular: RRR, No MRG, + varicose veins L leg Respiratory: CTA B Gastrointestinal: abdomen soft, NT, ND, BS+ Musculoskeletal: no deformities, strength intact in all 4 Skin: moist, warm, no rashes, multiple leg varicosities  ASSESSMENT: 1. DM2, insulin-dependent, uncontrolled, with complications - noncompliance with visits, insulin doses, CBG checks - CKD stage 3 - PN - ED - TIA  PLAN:  1. Patient with long-standing, uncontrolled diabetes, with still poor control despite increased compliance with his insulin since last visit. He is still missing 1 dose of NPH a  week as he falls asleep and forgets to take it. Therefore, I advised him to move this before dinner. He is on NPH and R insulin because of price. U500 insulin was too expensive in the past. However, since he is requiring such high doses of insulin, I would like to give it another try in this calendar year. - I gave him an insulin regimen for the U500 insulin in case he can get it, and we also change the doses of the N and R insulin in case he cannot get the U500. He still has regular syringes at home, and we will need to transition to U500 syringes when he runs out of these. - Last HbA1c was reviewed >> 9.7%  Patient Instructions   Please change to U500 insulin: - draw up to 20 units on the syringe before b'fast (100 units) - draw up to 12 units on the syringe before lunch (60 units) - draw up to 20 units on the syringe before dinner (100 units)  If we cannot use U500, then increase the insulin as follows: Type of insulin Breakfast Lunch Dinner  Humulin R 50 30-50 50  Humulin N 50 30 50   Please research the plant-based diet.  Please return in 1.5 months with your sugar log.   - continue checking sugars at different times of the day - check 3 times a day, rotating checks - he is up-to-date with eye exams - RTC in 1.5 mo  Philemon Kingdom, MD PhD [facility]

## 2016-07-05 NOTE — Patient Instructions (Addendum)
Please change to U500 insulin: - draw up to 20 units on the syringe before b'fast (100 units) - draw up to 12 units on the syringe before lunch (60 units) - draw up to 20 units on the syringe before dinner (100 units)  If we cannot use U500, then increase the insulin as follows: Type of insulin Breakfast Lunch Dinner  Humulin R 50 30-50 50  Humulin N 50 30 50   Please research the plant-based diet.  Please return in 1.5 months with your sugar log.

## 2016-07-08 ENCOUNTER — Other Ambulatory Visit: Payer: Self-pay | Admitting: Family Medicine

## 2016-07-14 ENCOUNTER — Ambulatory Visit (INDEPENDENT_AMBULATORY_CARE_PROVIDER_SITE_OTHER): Payer: Medicare Other

## 2016-07-14 DIAGNOSIS — E349 Endocrine disorder, unspecified: Secondary | ICD-10-CM | POA: Diagnosis not present

## 2016-07-14 MED ORDER — TESTOSTERONE CYPIONATE 200 MG/ML IM SOLN
150.0000 mg | Freq: Once | INTRAMUSCULAR | Status: AC
  Administered 2016-07-14: 150 mg via INTRAMUSCULAR

## 2016-07-19 ENCOUNTER — Encounter: Payer: Self-pay | Admitting: Family Medicine

## 2016-07-19 ENCOUNTER — Ambulatory Visit (INDEPENDENT_AMBULATORY_CARE_PROVIDER_SITE_OTHER): Payer: Medicare Other | Admitting: Family Medicine

## 2016-07-19 VITALS — BP 138/60 | HR 75 | Temp 97.5°F | Ht 71.0 in | Wt 240.2 lb

## 2016-07-19 DIAGNOSIS — F3341 Major depressive disorder, recurrent, in partial remission: Secondary | ICD-10-CM | POA: Diagnosis not present

## 2016-07-19 NOTE — Progress Notes (Signed)
Pre visit review using our clinic review tool, if applicable. No additional management support is needed unless otherwise documented below in the visit note. 

## 2016-07-19 NOTE — Progress Notes (Signed)
Dr. Frederico Hamman T. Roxy Filler, MD, [facility] [place], [address] Phone: [phone] Fax: [phone]  07/19/2016  Patient: [name], [name]: [id number], DOB: 11-30-1945, 71 y.o.  Primary Physician:  [name], MD   Chief Complaint  Patient presents with  . Follow-up    Depression   Subjective:   [name] is a 71 y.o. very pleasant male patient who presents with the following:  Max Effexor + recent Wellbutrin addition.  Dep is doing better.   Wt Readings from Last 3 Encounters:  07/19/16 240 lb 4 oz (109 kg)  07/05/16 240 lb (108.9 kg)  06/29/16 242 lb 8 oz (110 kg)    7 pound weight loss.    Past Medical History, Surgical History, Social History, Family History, Problem List, Medications, and Allergies have been reviewed and updated if relevant.  Patient Active Problem List   Diagnosis Date Noted  . Uncontrolled type 2 diabetes mellitus with peripheral neuropathy ([place]) 05/11/2016    Priority: Medium  . Former very heavy cigarette smoker (more than 40 per day) 10/07/2014    Priority: Medium  . Chronic kidney disease, stage III (moderate) 12/19/2012    Priority: Medium  . OSA (obstructive sleep apnea) 03/09/2011    Priority: Medium  . Diabetic neuropathy ([place]) 12/29/2010    Priority: Medium  . Hyperlipidemia LDL goal <70 06/13/2008    Priority: Medium  . Major depressive disorder, recurrent episode, in partial remission ([place]) 06/13/2008    Priority: Medium  . Essential hypertension 06/13/2008    Priority: Medium  . Mild cognitive disorder 06/29/2016  . Erectile dysfunction associated with type 2 diabetes mellitus ([place]) 05/28/2015  . Hypogonadism male 06/14/2012  . Obesity (BMI 30-39.9) 09/27/2011  . History of smoking 09/16/2010  . Family history of MI (myocardial infarction) 09/16/2010  . HIP REPLACEMENT, RIGHT, HX OF 02/05/2010  . GOUT 06/13/2008  . ALLERGIC RHINITIS 06/13/2008  .  GERD 06/13/2008  . OSTEOARTHRITIS 06/13/2008    Past Medical History:  Diagnosis Date  . Allergic rhinitis due to pollen   . Chronic kidney disease, stage III (moderate) 12/19/2012  . Depression   . Diabetes mellitus ([place])   . Diverticulosis   . Erectile dysfunction associated with type 2 diabetes mellitus ([place]) 05/28/2015  . Former very heavy cigarette smoker (more than 40 per day) 10/07/2014  . GERD (gastroesophageal reflux disease)   . Gout   . Headache   . Hyperlipidemia   . Hypertension   . Hypogonadism male 06/14/2012  . Iron deficiency   . Low testosterone   . Memory loss   . Osteoarthritis   . Personal history of colonic polyps    1996    Past Surgical History:  Procedure Laterality Date  . CARDIAC CATHETERIZATION  11/2011   [facility]  . CHOLECYSTECTOMY  1980  . TOTAL HIP ARTHROPLASTY  2009   Dr. Gladstone Lighter  . TOTAL HIP ARTHROPLASTY      Social History   Social History  . Marital status: Married    Spouse name: N/A  . Number of children: 4  . 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 Smoking status: Former Smoker    Packs/day: 3.00    Years: 35.00    Types: Cigarettes    Quit date: 04/12/2000  . Smokeless tobacco: Never Used  .  Alcohol use No  . Drug use: No  . Sexual activity: No   Other Topics Concern  . Not on file   Social History Narrative   No regular exercise   Caffeine use: yes, dt coke   Lives at home with his wife and his mother lives with him.   Right-handed.       Family History  Problem Relation Age of Onset  . Diabetes Mother   . Alzheimer's disease Mother   . Diabetes Father   . Lung cancer Father     Age 65  . Stroke Father   . Heart attack Father   . Lung cancer Sister     lung  . Heart disease Maternal Grandfather   . COPD Sister   . Heart attack Sister   . Heart disease Sister     Allergies  Allergen Reactions  . Tetracycline  Itching and Rash    Medication list reviewed and updated in full in [place].   GEN: No acute illnesses, no fevers, chills. GI: No n/v/d, eating normally Pulm: No SOB Interactive and getting along well at home.  Otherwise, ROS is as per the HPI.  Objective:   BP 138/60   Pulse 75   Temp 97.5 F (36.4 C) (Oral)   Ht '5\' 11"'$  (1.803 m)   Wt 240 lb 4 oz (109 kg)   BMI 33.51 kg/m   GEN: WDWN, NAD, Non-toxic, A & O x 3 HEENT: Atraumatic, Normocephalic. Neck supple. No masses, No LAD. Ears and Nose: No external deformity. CV: RRR, No M/G/R. No JVD. No thrill. No extra heart sounds. PULM: CTA B, no wheezes, crackles, rhonchi. No retractions. No resp. distress. No accessory muscle use. EXTR: No c/c/e NEURO Normal gait.  PSYCH: Normally interactive. Conversant. Not depressed or anxious appearing.  Calm demeanor.   Laboratory and Imaging Data:  Assessment and Plan:   Major depressive disorder, recurrent episode, in partial remission ([place])  Doing better with addition of wellbutrin combined with his effexor. Dep sx are much improved.  Follow-up: No Follow-up on file.   Signed,  Maud Deed. Dessirae Scarola, MD   Allergies as of 07/19/2016      Reactions   Tetracycline Itching, Rash      Medication List       Accurate as of 07/19/16  1:12 PM. Always use your most recent med list.          ampicillin 500 MG capsule Commonly known as:  PRINCIPEN   aspirin 81 MG chewable tablet Chew 81 mg by mouth at bedtime.   atenolol 100 MG tablet Commonly known as:  TENORMIN TAKE ONE TABLET BY MOUTH ONCE DAILY.   buPROPion 150 MG 12 hr tablet Commonly known as:  WELLBUTRIN SR Take 1 tablet (150 mg total) by mouth 2 (two) times daily.   cholecalciferol 1000 units tablet Commonly known as:  VITAMIN D Take 1,000 Units by mouth daily.   donepezil 10 MG tablet Commonly known as:  ARICEPT Take 1 tablet (10 mg total) by mouth at bedtime.   doxazosin 8 MG tablet Commonly known  as:  CARDURA TAKE ONE TABLET BY MOUTH AT BEDTIME   fluticasone 50 MCG/ACT nasal spray Commonly known as:  FLONASE INHALE 2 SPRAYS INTO EACH NOSTRIL EVERY DAY AS NEEDED   gabapentin 300 MG capsule Commonly known as:  NEURONTIN Take 1 capsule (300 mg total) by mouth 2 (two) times daily.   insulin NPH Human 100 UNIT/ML injection Commonly known as:  HUMULIN N,NOVOLIN N Inject 0.5 mLs (50 Units total) into the skin 3 (three) times daily.   insulin regular 250 units/2.65m (100 units/mL) injection Commonly known as:  NOVOLIN R,HUMULIN R Inject up to 150 units a day as advised   insulin regular human CONCENTRATED 500 UNIT/ML injection Commonly known as:  HUMULIN R Inject under skin 100 units before b'fast, 60 units before lunch and 100 units before dinner   lisinopril-hydrochlorothiazide 20-12.5 MG tablet Commonly known as:  PRINZIDE,ZESTORETIC TAKE ONE TABLET BY MOUTH ONCE DAILY.   memantine 10 MG tablet Commonly known as:  NAMENDA Take 1 tablet (10 mg total) by mouth 2 (two) times daily.   sildenafil 20 MG tablet Commonly known as:  REVATIO Generic Revatio / Sildanefil 20 mg. 2 - 5 tabs 30 mins prior to intercourse.   simvastatin 40 MG tablet Commonly known as:  ZOCOR TAKE ONE TABLET BY MOUTH AT BEDTIME   testosterone cypionate 200 MG/ML injection Commonly known as:  DEPOTESTOSTERONE CYPIONATE INJECT 0.75MLS ('150MG'$ ) INTO THE MUSCLE   venlafaxine XR 75 MG 24 hr capsule Commonly known as:  EFFEXOR-XR TAKE THREE (3) CAPSULES BY MOUTH DAILY   ZANTAC 75 75 MG tablet Generic drug:  ranitidine Take 225 mg by mouth daily.

## 2016-07-21 DIAGNOSIS — H2511 Age-related nuclear cataract, right eye: Secondary | ICD-10-CM | POA: Diagnosis not present

## 2016-07-21 DIAGNOSIS — H2512 Age-related nuclear cataract, left eye: Secondary | ICD-10-CM | POA: Diagnosis not present

## 2016-07-28 ENCOUNTER — Ambulatory Visit (INDEPENDENT_AMBULATORY_CARE_PROVIDER_SITE_OTHER): Payer: Medicare Other

## 2016-07-28 DIAGNOSIS — E349 Endocrine disorder, unspecified: Secondary | ICD-10-CM | POA: Diagnosis not present

## 2016-07-28 MED ORDER — TESTOSTERONE CYPIONATE 200 MG/ML IM SOLN
150.0000 mg | Freq: Once | INTRAMUSCULAR | Status: AC
  Administered 2016-07-28: 150 mg via INTRAMUSCULAR

## 2016-07-29 DIAGNOSIS — H2511 Age-related nuclear cataract, right eye: Secondary | ICD-10-CM | POA: Diagnosis not present

## 2016-07-29 HISTORY — PX: EYE SURGERY: SHX253

## 2016-08-11 ENCOUNTER — Ambulatory Visit (INDEPENDENT_AMBULATORY_CARE_PROVIDER_SITE_OTHER): Payer: Medicare Other | Admitting: Family Medicine

## 2016-08-11 ENCOUNTER — Ambulatory Visit: Payer: Medicare Other

## 2016-08-11 DIAGNOSIS — E349 Endocrine disorder, unspecified: Secondary | ICD-10-CM | POA: Diagnosis not present

## 2016-08-11 NOTE — Progress Notes (Signed)
Testosterone inj administered, pt tolerated inj well.

## 2016-08-12 DIAGNOSIS — H2512 Age-related nuclear cataract, left eye: Secondary | ICD-10-CM | POA: Diagnosis not present

## 2016-08-16 ENCOUNTER — Encounter: Payer: Self-pay | Admitting: Internal Medicine

## 2016-08-16 ENCOUNTER — Ambulatory Visit (INDEPENDENT_AMBULATORY_CARE_PROVIDER_SITE_OTHER): Payer: Medicare Other | Admitting: Internal Medicine

## 2016-08-16 VITALS — BP 122/72 | HR 84 | Ht 71.0 in | Wt 240.0 lb

## 2016-08-16 DIAGNOSIS — E1142 Type 2 diabetes mellitus with diabetic polyneuropathy: Secondary | ICD-10-CM

## 2016-08-16 DIAGNOSIS — IMO0002 Reserved for concepts with insufficient information to code with codable children: Secondary | ICD-10-CM

## 2016-08-16 DIAGNOSIS — E1165 Type 2 diabetes mellitus with hyperglycemia: Secondary | ICD-10-CM | POA: Diagnosis not present

## 2016-08-16 LAB — POCT GLYCOSYLATED HEMOGLOBIN (HGB A1C): Hemoglobin A1C: 10.3

## 2016-08-16 NOTE — Addendum Note (Signed)
Addended by: Caprice Beaver T on: 08/16/2016 03:23 PM   Modules accepted: Orders

## 2016-08-16 NOTE — Progress Notes (Signed)
Patient ID: [name], male   DOB: 1945/08/08, 71 y.o.   [name]: [id number]  HPI: [name] is a 71 y.o.-year-old male, returning for f/u for DM2, dx 1997, insulin-dependent since 2010, uncontrolled, with complications (CKD stage 3, PN, ED, TIA). Last visit 1.5 mo ago.  Last hemoglobin A1c was: Lab Results  Component Value Date   HGBA1C 9.7 05/11/2016   HGBA1C 9.4 (H) 06/05/2015   HGBA1C 9.4 12/10/2014   He was on: Type of insulin Breakfast Lunch Dinner 10-11 pm  Humulin R 50 30-50 50   Humulin N 50   50    Gets insulin w/o Rx From [facility].  Now on:  U500 insulin - misses many doses >> "sometimes I miss the whole day"; cannot continue >> cannot afford it - draw up to 20 units on the syringe before b'fast (100 units) - draw up to 10 (not 12) units on the syringe before lunch (60 units) - draw up to 20 (sometimes 25) units on the syringe before dinner (100 units)  Pt checks his sugars 2x a day >> still very high and fluctuating: - am: 70, 96, 132-332 >> 71, 96, 145-356, 458 >> 100-407, 541 >> 80, 165-394 - 2h after b'fast: n/c  - before lunch: n/c >> 80, 114-289 >> n/c - 2h after lunch: n/c >> 300s >> n/c - before dinner: n/c >> 217-237 >> 70 (no lunch), 150 >> 364 >> 198, 335 >> n/c - 2h after dinner: n/c - bedtime:251-401, 512, 534 x1 (ran out of insulin) >> 114, 250-406, 461 >> 208-477 >> 88-387, 416 - nighttime: 60s, wakes him up >> n/c >> 286 No lows since last visit, lowest 80.  he has hypoglycemia awareness at 100.  Highest sugar was 500's >> 458 >> 416.  Pt's meals are (stopped meat) - Breakfast: 3 pancakes with diet syrup - Lunch: tuna or cheese sandwich + chips - Dinner: veggies + starch - Snacks: 4-5 x a day Eats dinner: 7-7:30 pm. Bedtime 2-4 am.  - has CKD, last BUN/creatinine:  Lab Results  Component Value Date   BUN 22 06/02/2016   CREATININE 1.54 (H) 06/02/2016  Last ACR 4 on 12/12/2012. On Lisinopril 20. - last set of lipids: Lab Results   Component Value Date   CHOL 147 05/13/2016   HDL 27.40 (L) 05/13/2016   LDLCALC 43 04/24/2009   LDLDIRECT 46.0 05/13/2016   TRIG (H) 05/13/2016    698.0 Triglyceride is over 400; calculations on Lipids are invalid.   CHOLHDL 5 05/13/2016  On Zocor 40.. - last eye exam was in 06/2016 [facility] No DR. + Cataract surgeries.. - + numbness and tingling in his feet, especially at night.  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  ROS: Constitutional: no weight gain/no weight loss, no fatigue, no subjective hyperthermia, no subjective hypothermia Eyes: no blurry vision, no xerophthalmia ENT: no sore throat, no nodules palpated in throat, no dysphagia, no odynophagia, no hoarseness Cardiovascular: no CP/no SOB/no palpitations/leg swelling Respiratory: no cough/no SOB/no wheezing Gastrointestinal: no N/no V/no D/no C/no acid reflux Musculoskeletal: no muscle aches/no joint aches Skin: no rashes, no hair loss Neurological: no tremors/no numbness/no tingling/no dizziness + diff. With erections  I reviewed pt's medications, allergies, PMH, social hx, family hx, and changes were documented in the history of present illness. Otherwise, unchanged from my initial visit note.  PE: BP 122/72 (BP Location: Left Arm, Patient Position: Sitting)   Pulse 84   Ht  $'5\' 11"'v$  (1.803 m)   Wt 240 lb (108.9 kg)   SpO2 94%   BMI 33.47 kg/m  Body mass index is 33.47 kg/m. Wt Readings from Last 3 Encounters:  08/16/16 240 lb (108.9 kg)  07/19/16 240 lb 4 oz (109 kg)  07/05/16 240 lb (108.9 kg)   Constitutional: overweight, in NAD Eyes: PERRLA, EOMI, no exophthalmos ENT: moist mucous membranes, no thyromegaly, no cervical lymphadenopathy Cardiovascular: RRR, No MRG,  + varicose veins L leg Respiratory: CTA B Gastrointestinal: abdomen soft, NT, ND, BS+ Musculoskeletal: no deformities, strength intact in all 4 Skin: moist, warm, no rashes Neurological: no tremor with  outstretched hands, DTR normal in all 4  ASSESSMENT: 1. DM2, insulin-dependent, uncontrolled, with complications - noncompliance with visits, insulin doses, CBG checks - CKD stage 3 - PN - ED - TIA  PLAN:  1. Patient with long-standing, uncontrolled diabetes, with still poor control due to multiple missed doses. Also, he cannot afford U500 anymore >> advised him to contact [place] to see if her can obtain it. If not, we will need to switch back to N and R >> will add N before lunch. Main thing will be compliance though, and this is difficult as he was dx'ed with dementia.  - again discussed about the importance of diet - Last HbA1c was reviewed >> 9.7%  Patient Instructions   Please continue the U500 insulin: - draw up to 20 units on the syringe before b'fast (100 units) - draw up to 12 units on the syringe before lunch (60 units) - draw up to 20 units on the syringe before dinner (100 units)  If we cannot use U500, then increase the insulin as follows: Type of insulin Breakfast Lunch Dinner  Humulin R 50 30-50 50  Humulin N 50 30 50   Please research the plant-based diet. Read the following books: Dr. Alyssa Grove - Program for Reversing Diabetes Dr. Karl Luke - Prevent and Reverse Heart Disease Dr. Alden Benjamin - How Not to Die  Please return in 2-3 months with your sugar log.   - today, HbA1c is 10.3% (very high) - continue checking sugars at different times of the day - check 3x a day, rotating checks - advised for yearly eye exams >> he is UTD - Return to clinic in 3 mo with sugar log    Philemon Kingdom, MD PhD [facility] Endocrinology

## 2016-08-16 NOTE — Patient Instructions (Signed)
Please continue the U500 insulin: - draw up to 20 units on the syringe before b'fast (100 units) - draw up to 12 units on the syringe before lunch (60 units) - draw up to 20 units on the syringe before dinner (100 units)  If we cannot use U500, then increase the insulin as follows: Type of insulin Breakfast Lunch Dinner  Humulin R 50 30-50 50  Humulin N 50 30 50   Please research the plant-based diet. Read the following books: Dr. Alyssa Grove - Program for Reversing Diabetes Dr. Karl Luke - Prevent and Reverse Heart Disease Dr. Alden Benjamin - How Not to Die  Please return in 2-3 months with your sugar log.

## 2016-08-20 ENCOUNTER — Other Ambulatory Visit: Payer: Self-pay | Admitting: Pharmacist

## 2016-08-20 NOTE — Patient Outreach (Signed)
[name] was referred to pharmacy for medication assistance related to the cost of his insulin. Left a HIPAA compliant message with the patient's wife. If have not heard from patient by 08/25/16, will give him another call at that time.  [name], PharmD, [facility] [phone]

## 2016-08-20 NOTE — Patient Outreach (Signed)
[name] was self-referred to pharmacy for medication assistance related to the cost of his insulin. Called and spoke with patient. HIPAA identifiers verified and verbal consent received.  Mr. Shular reports that he is having trouble with affording Humulin U-500 insulin. Reports that the Humulin U-500 costs him $200 for a 30 day supply from his local pharmacy, [facility]. Reports that he has just started taking the Humulin U-500 this past month. States that he was previously on the Humulin N and Humulin R U-100, but that his endocrinologist, Dr. Cruzita Lederer, changed him to the U-500 in order to improve his blood sugar control. Reports that he got the Humulin N and Humulin R U-100 vials from [place] for $48 for both and that these lasted him about 3 weeks. Reports that he now has about 1 week left of his Humulin U-500 insulin. Reports that he uses 100 units/day of U-500 insulin.   Mr. Zietlow reports that his income is too high for him to qualify for extra help. Review the [employer] Patient assistance program requirements with Mr. Mehlhoff. He reports that he does meet the income requirement. However, reports that he believes that he has only spent about $600 of the required $1,100 in out-of-pocket costs thus far for the calendar year. Note that this program also requires that patients applying for assistance for U-500 either use at least 200 units of U-500/day or have a letter from their doctor indicating that it is medical necessity that they have U-500 insulin.  Let Mr. Matousek know that I will need to reach out to his endocrinologist and then follow back up with him. Mr. Guthrie denies any further medication questions/concerns at this time. Confirm that patient has my phone number.  PLAN:  1) Will send an InBasket message to patient's endocrinologist, Dr. Cruzita Lederer, letting her know about the [facility] requirement that patients applying for assistance for U-500  either use at least 200 units of U-500 per day or have a letter from their doctor indicating that it is medical necessity that they have U-500 insulin. Will ask Dr. Cruzita Lederer if it is medical necessity for the patient to have the Humulin U-500.  2) Will follow up with Mr. Neyhart once I have heard back from Dr. Cruzita Lederer.  [name], PharmD, [place] [facility] [phone]

## 2016-08-25 ENCOUNTER — Ambulatory Visit: Payer: Medicare Other

## 2016-08-25 ENCOUNTER — Other Ambulatory Visit: Payer: Self-pay | Admitting: Pharmacist

## 2016-08-25 NOTE — Patient Outreach (Signed)
Call to follow up with [name] regarding his Humulin U-500 insulin and the [employer] Patient Assistance program. HIPAA identifiers verified and verbal consent received.  Note that per [facility] message from patient's endocrinologist, Dr. Cruzita Lederer, [name] is currently requires a total of 260 units of Humulin U-500/day.   Let [name] know that I called to follow up with his local pharmacy, [facility], and that his copayment for Humulin U-500 this month is $40. Let him know that per the pharmacist, his copayment was higher last month because he was still paying toward his deductible. Review with [name] the out-of-pocket requirement for the [facility] program. Patient confirms that he has not yet met this limit and states that he will check with his pharmacy when he goes to pick up his insulin exactly how much he has spent this year.   Advise [name] about the cost savings of using mail order pharmacy for his medications. Patient verbalizes understanding and states that he has this phone number.  [name] denies any further medication questions at this time. Let the patient know that I will call to follow up with him again next month.  [name], PharmD, [facility] [phone]

## 2016-08-31 ENCOUNTER — Ambulatory Visit (INDEPENDENT_AMBULATORY_CARE_PROVIDER_SITE_OTHER): Payer: Medicare Other | Admitting: Family Medicine

## 2016-08-31 DIAGNOSIS — E349 Endocrine disorder, unspecified: Secondary | ICD-10-CM | POA: Diagnosis not present

## 2016-08-31 NOTE — Progress Notes (Signed)
Pt tolerated inj well

## 2016-09-09 ENCOUNTER — Encounter: Payer: Self-pay | Admitting: Pulmonary Disease

## 2016-09-09 ENCOUNTER — Ambulatory Visit (INDEPENDENT_AMBULATORY_CARE_PROVIDER_SITE_OTHER): Payer: Medicare Other | Admitting: Pulmonary Disease

## 2016-09-09 VITALS — BP 162/80 | HR 96 | Ht 71.0 in | Wt 239.4 lb

## 2016-09-09 DIAGNOSIS — J310 Chronic rhinitis: Secondary | ICD-10-CM | POA: Diagnosis not present

## 2016-09-09 DIAGNOSIS — G4733 Obstructive sleep apnea (adult) (pediatric): Secondary | ICD-10-CM | POA: Diagnosis not present

## 2016-09-09 DIAGNOSIS — R682 Dry mouth, unspecified: Secondary | ICD-10-CM | POA: Diagnosis not present

## 2016-09-09 DIAGNOSIS — Z72821 Inadequate sleep hygiene: Secondary | ICD-10-CM

## 2016-09-09 DIAGNOSIS — Z9989 Dependence on other enabling machines and devices: Secondary | ICD-10-CM | POA: Diagnosis not present

## 2016-09-09 NOTE — Progress Notes (Signed)
Current Outpatient Prescriptions on File Prior to Visit  Medication Sig  . ampicillin (PRINCIPEN) 500 MG capsule   . aspirin 81 MG chewable tablet Chew 81 mg by mouth at bedtime.   [name] Kitchen atenolol (TENORMIN) 100 MG tablet TAKE ONE TABLET BY MOUTH ONCE DAILY.  [name] Kitchen buPROPion (WELLBUTRIN SR) 150 MG 12 hr tablet Take 1 tablet (150 mg total) by mouth 2 (two) times daily.  . cholecalciferol (VITAMIN D) 1000 UNITS tablet Take 1,000 Units by mouth daily.    [name] Kitchen donepezil (ARICEPT) 10 MG tablet Take 1 tablet (10 mg total) by mouth at bedtime.  [name] Kitchen doxazosin (CARDURA) 8 MG tablet TAKE ONE TABLET BY MOUTH AT BEDTIME  . DUREZOL 0.05 % EMUL   . fluticasone (FLONASE) 50 MCG/ACT nasal spray INHALE 2 SPRAYS INTO EACH NOSTRIL EVERY DAY AS NEEDED  . gabapentin (NEURONTIN) 300 MG capsule Take 1 capsule (300 mg total) by mouth 2 (two) times daily.  [name] Kitchen gentamicin (GARAMYCIN) 0.3 % ophthalmic solution   . insulin regular human CONCENTRATED (HUMULIN R) 500 UNIT/ML injection Inject under skin 100 units before b'fast, 60 units before lunch and 100 units before dinner  . lisinopril-hydrochlorothiazide (PRINZIDE,ZESTORETIC) 20-12.5 MG tablet TAKE ONE TABLET BY MOUTH ONCE DAILY.  . memantine (NAMENDA) 10 MG tablet Take 1 tablet (10 mg total) by mouth 2 (two) times daily.  . ranitidine (ZANTAC 75) 75 MG tablet Take 225 mg by mouth daily.  . sildenafil (REVATIO) 20 MG tablet Generic Revatio / Sildanefil 20 mg. 2 - 5 tabs 30 mins prior to intercourse.  . simvastatin (ZOCOR) 40 MG tablet TAKE ONE TABLET BY MOUTH AT BEDTIME  . testosterone cypionate (DEPOTESTOSTERONE CYPIONATE) 200 MG/ML injection INJECT 0.75MLS (150MG ) INTO THE MUSCLE  . venlafaxine XR (EFFEXOR-XR) 75 MG 24 hr capsule TAKE THREE (3) CAPSULES BY MOUTH DAILY   Current Facility-Administered Medications on File Prior to Visit  Medication  . testosterone cypionate (DEPOTESTOSTERONE CYPIONATE) injection 150 mg     Chief Complaint  Patient presents with  . Follow-up   Pt did not bring SD card for DL--pt is going to bring this by in a few days. Wears CPAP nightly. Denies problems with mask/pressure. DME: [facility]     Sleep tests PSG 01/12/11 >> AHI 27  Cardiac tests Echo 12/24/14 >> EF 60 to 65%  Past medical history Depression, DM, HLD, HTN, GERD, Gout, Diverticulosis, Low T, CKD 3, ED  Past surgical history, Family history, Social history, Allergies reviewed  Vital Signs BP (!) 162/80 (BP Location: Left Arm, Cuff Size: Normal)   Pulse 96   Ht 5\' 11"  (1.803 m)   Wt 239 lb 6.4 oz (108.6 kg)   SpO2 96%   BMI 33.39 kg/m   History of Present Illness [name] is a 71 y.o. male with obstructive sleep apnea.  He has nasal pillow.  Wasn't able to tolerate full face mask >> too hot.  He gets dry mouth and crusting on lips.  He has trouble with sinus congestion.  Uses flonase and nasal irrigation.  Has to skip CPAP sometime due to nasal congestion.  Has chin strap, but not using.  He gets 4 hrs sleep at night. Stays awake until 4 am.  Sleeps 2 hours in afternoon, but not using CPAP then.   Physical Exam  General - pleasant Eyes - pupils reactive ENT - no sinus tenderness, no oral exudate, no LAN, MP 4, wears denturs Cardiac - regular, no murmur Chest - no wheeze, rales Abd - soft,  non tender Ext - no edema Skin - no rashes Neuro - normal strength Psych - normal mood    Assessment/Plan  Obstructive sleep apnea. - he reports compliance and benefit - continue CPAP 9 cm H2O >> will decide if he needs adjustment after review of his download - he will call if he wants to get a new machine since his current device is more than 71 yrs old  CPAP rhinitis and mouth dryness. - continue flonase, nasal irrigation - can add astelin as needed >> he will call - discussed adjusting temperature on humidifer - might need mask change  Poor sleep hygiene. - discussed proper sleep hygiene and consolidating his sleep - discussed importance of using CPAP  whenever he is asleep   Patient Instructions  Can try using a chin strap with CPAP mask Try adjusting temperature setting on CPAP humidifier Call if you want to get a new CPAP machine Will call with results of CPAP report  Follow up in 1 year    Dustin Mires, MD [place] [facility]/Critical Care/Sleep Pager:  [phone] 09/09/2016, 11:21 AM

## 2016-09-09 NOTE — Patient Instructions (Signed)
Can try using a chin strap with CPAP mask Try adjusting temperature setting on CPAP humidifier Call if you want to get a new CPAP machine Will call with results of CPAP report  Follow up in 1 year

## 2016-09-15 ENCOUNTER — Ambulatory Visit (INDEPENDENT_AMBULATORY_CARE_PROVIDER_SITE_OTHER): Payer: Medicare Other

## 2016-09-15 DIAGNOSIS — E349 Endocrine disorder, unspecified: Secondary | ICD-10-CM | POA: Diagnosis not present

## 2016-09-15 MED ORDER — TESTOSTERONE CYPIONATE 200 MG/ML IM SOLN
150.0000 mg | Freq: Once | INTRAMUSCULAR | Status: AC
  Administered 2016-09-15: 150 mg via INTRAMUSCULAR

## 2016-09-17 ENCOUNTER — Emergency Department (HOSPITAL_COMMUNITY)
Admission: EM | Admit: 2016-09-17 | Discharge: 2016-09-17 | Disposition: A | Discharge status: Home / Self Care | Payer: Medicare Other | Attending: Emergency Medicine | Admitting: Emergency Medicine

## 2016-09-17 ENCOUNTER — Emergency Department (HOSPITAL_COMMUNITY): Payer: Medicare Other

## 2016-09-17 ENCOUNTER — Encounter (HOSPITAL_COMMUNITY): Payer: Self-pay | Admitting: Neurology

## 2016-09-17 DIAGNOSIS — S61411A Laceration without foreign body of right hand, initial encounter: Secondary | ICD-10-CM | POA: Diagnosis not present

## 2016-09-17 DIAGNOSIS — S61212A Laceration without foreign body of right middle finger without damage to nail, initial encounter: Secondary | ICD-10-CM | POA: Insufficient documentation

## 2016-09-17 DIAGNOSIS — S21111A Laceration without foreign body of right front wall of thorax without penetration into thoracic cavity, initial encounter: Secondary | ICD-10-CM | POA: Insufficient documentation

## 2016-09-17 DIAGNOSIS — W25XXXA Contact with sharp glass, initial encounter: Secondary | ICD-10-CM | POA: Diagnosis not present

## 2016-09-17 DIAGNOSIS — N183 Chronic kidney disease, stage 3 (moderate): Secondary | ICD-10-CM | POA: Insufficient documentation

## 2016-09-17 DIAGNOSIS — Y92009 Unspecified place in unspecified non-institutional (private) residence as the place of occurrence of the external cause: Secondary | ICD-10-CM | POA: Insufficient documentation

## 2016-09-17 DIAGNOSIS — Y998 Other external cause status: Secondary | ICD-10-CM | POA: Diagnosis not present

## 2016-09-17 DIAGNOSIS — Y9389 Activity, other specified: Secondary | ICD-10-CM | POA: Insufficient documentation

## 2016-09-17 DIAGNOSIS — Z87891 Personal history of nicotine dependence: Secondary | ICD-10-CM | POA: Diagnosis not present

## 2016-09-17 DIAGNOSIS — I129 Hypertensive chronic kidney disease with stage 1 through stage 4 chronic kidney disease, or unspecified chronic kidney disease: Secondary | ICD-10-CM | POA: Diagnosis not present

## 2016-09-17 DIAGNOSIS — S61511A Laceration without foreign body of right wrist, initial encounter: Secondary | ICD-10-CM | POA: Diagnosis not present

## 2016-09-17 DIAGNOSIS — S61419A Laceration without foreign body of unspecified hand, initial encounter: Secondary | ICD-10-CM | POA: Diagnosis not present

## 2016-09-17 DIAGNOSIS — E785 Hyperlipidemia, unspecified: Secondary | ICD-10-CM | POA: Insufficient documentation

## 2016-09-17 DIAGNOSIS — Z7982 Long term (current) use of aspirin: Secondary | ICD-10-CM | POA: Insufficient documentation

## 2016-09-17 DIAGNOSIS — Z794 Long term (current) use of insulin: Secondary | ICD-10-CM | POA: Diagnosis not present

## 2016-09-17 DIAGNOSIS — T07XXXA Unspecified multiple injuries, initial encounter: Secondary | ICD-10-CM

## 2016-09-17 DIAGNOSIS — E1140 Type 2 diabetes mellitus with diabetic neuropathy, unspecified: Secondary | ICD-10-CM | POA: Insufficient documentation

## 2016-09-17 DIAGNOSIS — Z79899 Other long term (current) drug therapy: Secondary | ICD-10-CM | POA: Insufficient documentation

## 2016-09-17 LAB — BASIC METABOLIC PANEL
ANION GAP: 9 (ref 5–15)
BUN: 28 mg/dL — ABNORMAL HIGH (ref 6–20)
CALCIUM: 9.2 mg/dL (ref 8.9–10.3)
CO2: 27 mmol/L (ref 22–32)
Chloride: 97 mmol/L — ABNORMAL LOW (ref 101–111)
Creatinine, Ser: 1.69 mg/dL — ABNORMAL HIGH (ref 0.61–1.24)
GFR calc non Af Amer: 39 mL/min — ABNORMAL LOW (ref >60)
GFR, EST AFRICAN AMERICAN: 46 mL/min — AB (ref >60)
Glucose, Bld: 123 mg/dL — ABNORMAL HIGH (ref 65–99)
Potassium: 3.3 mmol/L — ABNORMAL LOW (ref 3.5–5.1)
Sodium: 133 mmol/L — ABNORMAL LOW (ref 135–145)

## 2016-09-17 LAB — CBG MONITORING, ED: GLUCOSE-CAPILLARY: 100 mg/dL — AB (ref 65–99)

## 2016-09-17 LAB — CBC
HEMATOCRIT: 42.9 % (ref 39.0–52.0)
Hemoglobin: 15.1 g/dL (ref 13.0–17.0)
MCH: 30.9 pg (ref 26.0–34.0)
MCHC: 35.2 g/dL (ref 30.0–36.0)
MCV: 87.7 fL (ref 78.0–100.0)
PLATELETS: 146 10*3/uL — AB (ref 150–400)
RBC: 4.89 MIL/uL (ref 4.22–5.81)
RDW: 12.5 % (ref 11.5–15.5)
WBC: 9.9 10*3/uL (ref 4.0–10.5)

## 2016-09-17 MED ORDER — ACETAMINOPHEN 325 MG PO TABS
650.0000 mg | ORAL_TABLET | Freq: Once | ORAL | Status: AC
  Administered 2016-09-17: 650 mg via ORAL
  Filled 2016-09-17: qty 2

## 2016-09-17 MED ORDER — LIDOCAINE HCL (PF) 2 % IJ SOLN
10.0000 mL | Freq: Once | INTRAMUSCULAR | Status: AC
  Administered 2016-09-17: 10 mL
  Filled 2016-09-17: qty 10

## 2016-09-17 NOTE — ED Notes (Signed)
Patient transported to X-ray 

## 2016-09-17 NOTE — ED Triage Notes (Signed)
Pt reports this morning he fell into his gun cabinet and his right hand went through the glass. He has laceration to palm extending into wrist, appears deep, no exposed tendons. Has full ROM. Bleeding controlled. Laceration to right middle finger. Takes aspirin. Soreness to right rib cage from fall.

## 2016-09-17 NOTE — Discharge Instructions (Signed)
Suture, staple removal in 7-10 days, follow up with your primary care doctor, I have ordered a walker for you to use at home.

## 2016-09-17 NOTE — ED Notes (Signed)
Pt ambulated to the bathroom no problems

## 2016-09-17 NOTE — ED Provider Notes (Signed)
[facility] DEPT Provider Note   CSN: [id number] Arrival date & time: 09/17/16  1047     History   Chief Complaint Chief Complaint  Patient presents with  . Hand Injury    HPI [name] is a 71 y.o. male.  HPI Patient presents to the emergency room for evaluation of a hand laceration. Patient has a history of diabetes as well as prior stroke. Patient states he has had some trouble with chronic dizziness. He has seen a neurologist and they are not sure what specifically causes his trouble.  He continues to have intermittent episodes of dizziness. Today while walking he had a recurrent episode where he started to feel unsteady. Patient had to reach his hand out to steady himself on his gun cabinet and accidentally he pressed on the glass. The glass shattered and he sustained a laceration to his right hand.  Patient denies any numbness or weakness. Denies any headache. No vomiting diarrhea. No chest pain or difficulty breathing.  He checked his blood sugar at home and it was in the 70s. He had something to eat and has not taken his insulin this morning.  Tetanus is less than 10 years Past Medical History:  Diagnosis Date  . Allergic rhinitis due to pollen   . Chronic kidney disease, stage III (moderate) 12/19/2012  . Depression   . Diabetes mellitus ([place])   . Diverticulosis   . Erectile dysfunction associated with type 2 diabetes mellitus ([place]) 05/28/2015  . Former very heavy cigarette smoker (more than 40 per day) 10/07/2014  . GERD (gastroesophageal reflux disease)   . Gout   . Headache   . Hyperlipidemia   . Hypertension   . Hypogonadism male 06/14/2012  . Iron deficiency   . Low testosterone   . Memory loss   . Osteoarthritis   . Personal history of colonic polyps    1996    Patient Active Problem List   Diagnosis Date Noted  . Mild cognitive disorder 06/29/2016  . Uncontrolled type 2 diabetes mellitus with peripheral neuropathy ([place]) 05/11/2016  . Erectile  dysfunction associated with type 2 diabetes mellitus ([place]) 05/28/2015  . Former very heavy cigarette smoker (more than 40 per day) 10/07/2014  . Chronic kidney disease, stage III (moderate) 12/19/2012  . Hypogonadism male 06/14/2012  . Obesity (BMI 30-39.9) 09/27/2011  . OSA (obstructive sleep apnea) 03/09/2011  . Diabetic neuropathy ([place]) 12/29/2010  . History of smoking 09/16/2010  . Family history of MI (myocardial infarction) 09/16/2010  . HIP REPLACEMENT, RIGHT, HX OF 02/05/2010  . Hyperlipidemia LDL goal <70 06/13/2008  . GOUT 06/13/2008  . Major depressive disorder, recurrent episode, in partial remission ([place]) 06/13/2008  . Essential hypertension 06/13/2008  . ALLERGIC RHINITIS 06/13/2008  . GERD 06/13/2008  . OSTEOARTHRITIS 06/13/2008    Past Surgical History:  Procedure Laterality Date  . CARDIAC CATHETERIZATION  11/2011   [facility]  . CHOLECYSTECTOMY  1980  . TOTAL HIP ARTHROPLASTY  2009   Dr. Gladstone Lighter  . TOTAL HIP ARTHROPLASTY         Home Medications    Prior to Admission medications   Medication Sig Start Date End Date Taking? Authorizing Provider  ampicillin (PRINCIPEN) 500 MG capsule Take 500 mg by mouth daily.  04/20/16  Yes [provider]  aspirin 81 MG chewable tablet Chew 81 mg by mouth at bedtime.    Yes [provider]  atenolol (TENORMIN) 100 MG tablet TAKE ONE TABLET BY MOUTH ONCE DAILY. 08/22/15  Yes Copland, Frederico Hamman, MD  buPROPion (WELLBUTRIN SR) 150 MG 12 hr tablet Take 1 tablet (150 mg total) by mouth 2 (two) times daily. 06/02/16  Yes Copland, Frederico Hamman, MD  cholecalciferol (VITAMIN D) 1000 UNITS tablet Take 1,000 Units by mouth daily.     Yes [provider]  donepezil (ARICEPT) 10 MG tablet Take 1 tablet (10 mg total) by mouth at bedtime. 06/29/16  Yes Marcial Pacas, MD  doxazosin (CARDURA) 8 MG tablet TAKE ONE TABLET BY MOUTH AT BEDTIME 11/12/15  Yes Copland, Frederico Hamman, MD  DUREZOL 0.05 % EMUL  07/22/16  Yes [provider]    fluticasone (FLONASE) 50 MCG/ACT nasal spray INHALE 2 SPRAYS INTO EACH NOSTRIL EVERY DAY AS NEEDED 07/08/16  Yes Copland, Frederico Hamman, MD  gabapentin (NEURONTIN) 300 MG capsule Take 1 capsule (300 mg total) by mouth 2 (two) times daily. 07/09/15  Yes Copland, Frederico Hamman, MD  gentamicin (GARAMYCIN) 0.3 % ophthalmic solution  07/22/16  Yes [provider]  insulin regular human CONCENTRATED (HUMULIN R) 500 UNIT/ML injection Inject under skin 100 units before b'fast, 60 units before lunch and 100 units before dinner 07/05/16  Yes Philemon Kingdom, MD  lisinopril-hydrochlorothiazide (PRINZIDE,ZESTORETIC) 20-12.5 MG tablet TAKE ONE TABLET BY MOUTH ONCE DAILY. 08/22/15  Yes Copland, Frederico Hamman, MD  memantine (NAMENDA) 10 MG tablet Take 1 tablet (10 mg total) by mouth 2 (two) times daily. 06/29/16  Yes Marcial Pacas, MD  ranitidine (ZANTAC 75) 75 MG tablet Take 225 mg by mouth daily.   Yes [provider]  sildenafil (REVATIO) 20 MG tablet Generic Revatio / Sildanefil 20 mg. 2 - 5 tabs 30 mins prior to intercourse. 05/28/15  Yes Copland, Frederico Hamman, MD  Sildenafil Citrate (VIAGRA PO) Take 1 tablet by mouth daily as needed. ed   Yes [provider]  simvastatin (ZOCOR) 40 MG tablet TAKE ONE TABLET BY MOUTH AT BEDTIME 06/13/16  Yes Copland, Frederico Hamman, MD  testosterone cypionate (DEPOTESTOSTERONE CYPIONATE) 200 MG/ML injection INJECT 0.75MLS (150MG ) INTO THE MUSCLE 06/14/16  Yes Copland, Frederico Hamman, MD  venlafaxine XR (EFFEXOR-XR) 75 MG 24 hr capsule TAKE THREE (3) CAPSULES BY MOUTH DAILY 05/21/16  Yes Copland, Frederico Hamman, MD    Family History Family History  Problem Relation Age of Onset  . Diabetes Mother   . Alzheimer's disease Mother   . Diabetes Father   . Lung cancer Father        Age 40  . Stroke Father   . Heart attack Father   . Lung cancer Sister        lung  . Heart disease Maternal Grandfather   . COPD Sister   . Heart attack Sister   . Heart disease Sister     Social History Social  History  Substance Use Topics  . Smoking status: Former Smoker    Packs/day: 3.00    Years: 35.00    Types: Cigarettes    Quit date: 04/12/2000  . Smokeless tobacco: Never Used  . Alcohol use No     Allergies   Tetracycline   Review of Systems Review of Systems  All other systems reviewed and are negative.    Physical Exam Updated Vital Signs BP 120/64   Pulse 61   Temp 97.5 F (36.4 C) (Oral)   Resp 18   Ht 1.803 m (5\' 11" )   Wt 108.9 kg (240 lb)   SpO2 95%   BMI 33.47 kg/m   Physical Exam  Constitutional: He is oriented to person, place, and time. He appears  well-developed and well-nourished. No distress.  HENT:  Head: Normocephalic and atraumatic.  Right Ear: External ear normal.  Left Ear: External ear normal.  Mouth/Throat: Oropharynx is clear and moist.  Eyes: Conjunctivae are normal. Right eye exhibits no discharge. Left eye exhibits no discharge. No scleral icterus.  Neck: Neck supple. No tracheal deviation present.  Cardiovascular: Normal rate, regular rhythm and intact distal pulses.   Pulmonary/Chest: Effort normal and breath sounds normal. No stridor. No respiratory distress. He has no wheezes. He has no rales.  Abdominal: Soft. Bowel sounds are normal. He exhibits no distension. There is no tenderness. There is no rebound and no guarding.  Musculoskeletal: He exhibits no edema or tenderness.  Laceration of the wrist, palm area of the right hand, no active bleeding,  Proximal portion of the wound is more superficial with the palmar portion being deeper, no tendon or vascular injury appreciated, distal nv intact, 5/5 strength  Pt also has a laceration to the finger pad of the ring finger of the right hand, distal nv intact  Neurological: He is alert and oriented to person, place, and time. He has normal strength. No cranial nerve deficit (No facial droop, extraocular movements intact, tongue midline ) or sensory deficit. He exhibits normal muscle tone. He  displays no seizure activity. Coordination normal.  No pronator drift bilateral upper extrem, able to hold both legs off bed for 5 seconds, sensation intact in all extremities, no visual field cuts, no left or right sided neglect, normal finger-nose exam bilaterally, no nystagmus noted   Skin: Skin is warm and dry. No rash noted.  Psychiatric: He has a normal mood and affect.  Nursing note and vitals reviewed.    ED Treatments / Results  Labs (all labs ordered are listed, but only abnormal results are displayed) Labs Reviewed  CBC - Abnormal; Notable for the following:       Result Value   Platelets 146 (*)    All other components within normal limits  BASIC METABOLIC PANEL - Abnormal; Notable for the following:    Sodium 133 (*)    Potassium 3.3 (*)    Chloride 97 (*)    Glucose, Bld 123 (*)    BUN 28 (*)    Creatinine, Ser 1.69 (*)    GFR calc non Af Amer 39 (*)    GFR calc Af Amer 46 (*)    All other components within normal limits  CBG MONITORING, ED - Abnormal; Notable for the following:    Glucose-Capillary 100 (*)    All other components within normal limits      Radiology Dg Hand Complete Right  Result Date: 09/17/2016 CLINICAL DATA:  Multiple right hand lacerations in the patient put his hand through a glass cabinet today. Initial encounter. EXAM: RIGHT HAND - COMPLETE 3+ VIEW COMPARISON:  None. FINDINGS: No radiopaque foreign body is identified. No fracture or dislocation. Mild osteoarthritic change about the DIP and PIP joints is noted. Bandaging is present about the wrist. IMPRESSION: Negative for fracture or foreign body. Electronically Signed   By: Inge Rise M.D.   On: 09/17/2016 13:15    Procedures Procedures (including critical care time)  Medications Ordered in ED Medications  lidocaine (XYLOCAINE) 2 % injection 10 mL (10 mLs Infiltration Given 09/17/16 1147)  acetaminophen (TYLENOL) tablet 650 mg (650 mg Oral Given 09/17/16 1152)     Initial  Impression / Assessment and Plan / ED Course  I have reviewed the triage vital signs and  the nursing notes.  Pertinent labs & imaging results that were available during my care of the patient were reviewed by me and considered in my medical decision making (see chart for details).  Clinical Course as of Sep 17 1516  Fri Sep 17, 2016  1512 NSR , rate 60 on the monitor  [JK]    Clinical Course User Index [JK] Dorie Rank, MD   Patient presented to the emergency room for evaluation of lacerations associated with the fall. Patient states he has been having trouble with dizziness.  Patient has recurrent issues associated with this over the years. He states he has seen a neurologist as well as his primary care doctor. He has been using a cane but does not use a walker.  Does not have any focal neurologic findings on exam today. I doubt acute stroke or TIA. His laboratory tests are unchanged from his baseline. His vital signs are reassuring. I recommend he follow up with his primary doctor or neurologist. I also recommended he use a walker.  Lacerations were repaired by PA [place].  Final Clinical Impressions(s) / ED Diagnoses   Final diagnoses:  Multiple lacerations    New Prescriptions New Prescriptions   No medications on file     Dorie Rank, MD 09/17/16 1521

## 2016-09-17 NOTE — ED Provider Notes (Signed)
LACERATION REPAIR Performed by: Recardo Evangelist Authorized by: Recardo Evangelist Consent: Verbal consent obtained. Risks and benefits: risks, benefits and alternatives were discussed Consent given by: patient Patient identity confirmed: provided demographic data Prepped and Draped in normal sterile fashion Wound explored  Laceration Location: Right middle finger  Laceration Length: 4 cm  No Foreign Bodies seen or palpated  Anesthesia: local infiltration  Local anesthetic: lidocaine 2%   Anesthetic total: 4 ml  Irrigation method: syringe Amount of cleaning: standard  Skin closure: 5-0 Nylon  Number of sutures: 8  Technique: Simple interrupted  Patient tolerance: Patient tolerated the procedure well with no immediate complications.   LACERATION REPAIR Performed by: Recardo Evangelist Authorized by: Recardo Evangelist Consent: Verbal consent obtained. Risks and benefits: risks, benefits and alternatives were discussed Consent given by: patient Patient identity confirmed: provided demographic data Prepped and Draped in normal sterile fashion Wound explored  Laceration Location: Right proximal palm and wrist  Laceration Length: 10 cm  No Foreign Bodies seen or palpated  Anesthesia: local infiltration  Local anesthetic: lidocaine 2%   Anesthetic total: 10 ml  Irrigation method: syringe Amount of cleaning: standard  Skin closure: 5-0 Nylon  Number of sutures: 13  Technique: Simple interrupted  Patient tolerance: Patient tolerated the procedure well with no immediate complications.   LACERATION REPAIR Performed by: Recardo Evangelist Authorized by: Recardo Evangelist Consent: Verbal consent obtained. Risks and benefits: risks, benefits and alternatives were discussed Consent given by: patient Patient identity confirmed: provided demographic data Prepped and Draped in normal sterile fashion Wound explored  Laceration Location: Right distal  palm  Laceration Length: 2 cm  No Foreign Bodies seen or palpated  Anesthesia: local infiltration  Local anesthetic: lidocaine 2%   Anesthetic total: 2 mL  Irrigation method: syringe Amount of cleaning: standard  Skin closure: 5-0 Nylon  Number of sutures: 3  Technique: Simple interrupted  Patient tolerance: Patient tolerated the procedure well with no immediate complications.   LACERATION REPAIR Performed by: Recardo Evangelist Authorized by: Recardo Evangelist Consent: Verbal consent obtained. Risks and benefits: risks, benefits and alternatives were discussed Consent given by: patient Patient identity confirmed: provided demographic data Prepped and Draped in normal sterile fashion Wound explored  Laceration Location: Right upper lateral chest wall  Laceration Length: 6 cm  No Foreign Bodies seen or palpated  Anesthesia: local infiltration  Local anesthetic: lidocaine 2%  Anesthetic total: 3 ml  Irrigation method: syringe Amount of cleaning: standard  Skin closure: staples  Number of staples: 7  Technique: n/a  Patient tolerance: Patient tolerated the procedure well with no immediate complications.    Recardo Evangelist, [name] 09/17/16 1554    Dorie Rank, MD 09/18/16 [phone]

## 2016-09-20 ENCOUNTER — Telehealth: Payer: Self-pay | Admitting: Family Medicine

## 2016-09-20 ENCOUNTER — Encounter (HOSPITAL_COMMUNITY): Payer: Self-pay | Admitting: Emergency Medicine

## 2016-09-20 ENCOUNTER — Emergency Department (HOSPITAL_COMMUNITY)
Admission: EM | Admit: 2016-09-20 | Discharge: 2016-09-20 | Disposition: A | Discharge status: Home / Self Care | Payer: Medicare Other | Attending: Emergency Medicine | Admitting: Emergency Medicine

## 2016-09-20 ENCOUNTER — Emergency Department (HOSPITAL_COMMUNITY): Payer: Medicare Other

## 2016-09-20 ENCOUNTER — Telehealth: Payer: Self-pay | Admitting: Neurology

## 2016-09-20 DIAGNOSIS — E1122 Type 2 diabetes mellitus with diabetic chronic kidney disease: Secondary | ICD-10-CM | POA: Insufficient documentation

## 2016-09-20 DIAGNOSIS — R0789 Other chest pain: Secondary | ICD-10-CM | POA: Diagnosis not present

## 2016-09-20 DIAGNOSIS — Z7982 Long term (current) use of aspirin: Secondary | ICD-10-CM | POA: Diagnosis not present

## 2016-09-20 DIAGNOSIS — I129 Hypertensive chronic kidney disease with stage 1 through stage 4 chronic kidney disease, or unspecified chronic kidney disease: Secondary | ICD-10-CM | POA: Insufficient documentation

## 2016-09-20 DIAGNOSIS — Z87891 Personal history of nicotine dependence: Secondary | ICD-10-CM | POA: Diagnosis not present

## 2016-09-20 DIAGNOSIS — N183 Chronic kidney disease, stage 3 (moderate): Secondary | ICD-10-CM | POA: Diagnosis not present

## 2016-09-20 DIAGNOSIS — Z794 Long term (current) use of insulin: Secondary | ICD-10-CM | POA: Insufficient documentation

## 2016-09-20 DIAGNOSIS — E1140 Type 2 diabetes mellitus with diabetic neuropathy, unspecified: Secondary | ICD-10-CM | POA: Diagnosis not present

## 2016-09-20 DIAGNOSIS — R079 Chest pain, unspecified: Secondary | ICD-10-CM | POA: Diagnosis not present

## 2016-09-20 DIAGNOSIS — R42 Dizziness and giddiness: Secondary | ICD-10-CM | POA: Diagnosis not present

## 2016-09-20 LAB — CBC WITH DIFFERENTIAL/PLATELET
BASOS ABS: 0 10*3/uL (ref 0.0–0.1)
Basophils Relative: 0 %
Eosinophils Absolute: 0.1 10*3/uL (ref 0.0–0.7)
Eosinophils Relative: 1 %
HEMATOCRIT: 40.6 % (ref 39.0–52.0)
Hemoglobin: 14 g/dL (ref 13.0–17.0)
LYMPHS ABS: 1.6 10*3/uL (ref 0.7–4.0)
LYMPHS PCT: 16 %
MCH: 31 pg (ref 26.0–34.0)
MCHC: 34.5 g/dL (ref 30.0–36.0)
MCV: 89.8 fL (ref 78.0–100.0)
MONO ABS: 1 10*3/uL (ref 0.1–1.0)
MONOS PCT: 10 %
NEUTROS ABS: 7.3 10*3/uL (ref 1.7–7.7)
Neutrophils Relative %: 73 %
Platelets: 144 10*3/uL — ABNORMAL LOW (ref 150–400)
RBC: 4.52 MIL/uL (ref 4.22–5.81)
RDW: 12.8 % (ref 11.5–15.5)
WBC: 9.8 10*3/uL (ref 4.0–10.5)

## 2016-09-20 LAB — COMPREHENSIVE METABOLIC PANEL
ALT: 14 U/L — ABNORMAL LOW (ref 17–63)
AST: 18 U/L (ref 15–41)
Albumin: 3.7 g/dL (ref 3.5–5.0)
Alkaline Phosphatase: 64 U/L (ref 38–126)
Anion gap: 8 (ref 5–15)
BILIRUBIN TOTAL: 1 mg/dL (ref 0.3–1.2)
BUN: 24 mg/dL — AB (ref 6–20)
CO2: 28 mmol/L (ref 22–32)
Calcium: 8.9 mg/dL (ref 8.9–10.3)
Chloride: 97 mmol/L — ABNORMAL LOW (ref 101–111)
Creatinine, Ser: 2.07 mg/dL — ABNORMAL HIGH (ref 0.61–1.24)
GFR calc Af Amer: 36 mL/min — ABNORMAL LOW (ref >60)
GFR, EST NON AFRICAN AMERICAN: 31 mL/min — AB (ref >60)
Glucose, Bld: 234 mg/dL — ABNORMAL HIGH (ref 65–99)
POTASSIUM: 4 mmol/L (ref 3.5–5.1)
Sodium: 133 mmol/L — ABNORMAL LOW (ref 135–145)
TOTAL PROTEIN: 6.5 g/dL (ref 6.5–8.1)

## 2016-09-20 LAB — TROPONIN I

## 2016-09-20 NOTE — Discharge Instructions (Signed)
Tests show no life-threatening condition. Follow-up your primary care doctor.

## 2016-09-20 NOTE — ED Triage Notes (Signed)
Pt st's he has been having sharp pain in the left chest today.  Also st's he feels like his head is spinning.  Pt st's he has fallen 4 times in past week due to "my head spinning".  Pt denies any shortness of breath or other symptoms.  Pt denies any chest pain at this time

## 2016-09-20 NOTE — Telephone Encounter (Signed)
I spoke with pt and advised with the symptoms he was having I spoke with Dr Lorelei Pont and he advised pt to go to ED for eval. Pt said he was not going to go to ED and sit 8 - 9 hrs to be seen. Advised pt concern with pt diabetes and possible stroke. Pt voiced understanding and said he would go back to ED. FYI to Dr Lorelei Pont.

## 2016-09-20 NOTE — ED Provider Notes (Addendum)
[facility] DEPT Provider Note   CSN: [id number] Arrival date & time: 09/20/16  1523     History   Chief Complaint Chief Complaint  Patient presents with  . Chest Pain    HPI [name] is a 71 y.o. male.  Chest pain described as a needlelike sensation lasting approximately 1 second intermittently for a couple months. Episodes occur twice per day and are not associated with any activity. No associated dyspnea, diaphoresis, nausea. Cardiac catheterization several years ago was normal. Feels fatigue. Past medical history includes diabetes, hypercholesterolemia, hypertension, TIA. Severity is mild.      Past Medical History:  Diagnosis Date  . Allergic rhinitis due to pollen   . Chronic kidney disease, stage III (moderate) 12/19/2012  . Depression   . Diabetes mellitus ([place])   . Diverticulosis   . Erectile dysfunction associated with type 2 diabetes mellitus ([place]) 05/28/2015  . Former very heavy cigarette smoker (more than 40 per day) 10/07/2014  . GERD (gastroesophageal reflux disease)   . Gout   . Headache   . Hyperlipidemia   . Hypertension   . Hypogonadism male 06/14/2012  . Iron deficiency   . Low testosterone   . Memory loss   . Osteoarthritis   . Personal history of colonic polyps    1996    Patient Active Problem List   Diagnosis Date Noted  . Mild cognitive disorder 06/29/2016  . Uncontrolled type 2 diabetes mellitus with peripheral neuropathy ([place]) 05/11/2016  . Erectile dysfunction associated with type 2 diabetes mellitus ([place]) 05/28/2015  . Former very heavy cigarette smoker (more than 40 per day) 10/07/2014  . Chronic kidney disease, stage III (moderate) 12/19/2012  . Hypogonadism male 06/14/2012  . Obesity (BMI 30-39.9) 09/27/2011  . OSA (obstructive sleep apnea) 03/09/2011  . Diabetic neuropathy ([place]) 12/29/2010  . History of smoking 09/16/2010  . Family history of MI (myocardial infarction) 09/16/2010  . HIP REPLACEMENT, RIGHT, HX OF  02/05/2010  . Hyperlipidemia LDL goal <70 06/13/2008  . GOUT 06/13/2008  . Major depressive disorder, recurrent episode, in partial remission ([place]) 06/13/2008  . Essential hypertension 06/13/2008  . ALLERGIC RHINITIS 06/13/2008  . GERD 06/13/2008  . OSTEOARTHRITIS 06/13/2008    Past Surgical History:  Procedure Laterality Date  . CARDIAC CATHETERIZATION  11/2011   [facility]  . CHOLECYSTECTOMY  1980  . TOTAL HIP ARTHROPLASTY  2009   Dr. Gladstone Lighter  . TOTAL HIP ARTHROPLASTY         Home Medications    Prior to Admission medications   Medication Sig Start Date End Date Taking? Authorizing Provider  amoxicillin (AMOXIL) 500 MG capsule Take 500 mg by mouth daily. #30 filled 08/26/16 08/26/16  Yes [provider]  aspirin EC 81 MG tablet Take 81 mg by mouth at bedtime.   Yes [provider]  atenolol (TENORMIN) 100 MG tablet TAKE ONE TABLET BY MOUTH ONCE DAILY. 08/22/15  Yes Copland, Frederico Hamman, MD  buPROPion (WELLBUTRIN SR) 150 MG 12 hr tablet Take 1 tablet (150 mg total) by mouth 2 (two) times daily. 06/02/16  Yes Copland, Frederico Hamman, MD  cholecalciferol (VITAMIN D) 1000 UNITS tablet Take 1,000 Units by mouth daily.     Yes [provider]  donepezil (ARICEPT) 10 MG tablet Take 1 tablet (10 mg total) by mouth at bedtime. 06/29/16  Yes Marcial Pacas, MD  doxazosin (CARDURA) 8 MG tablet TAKE ONE TABLET BY MOUTH AT BEDTIME 11/12/15  Yes Copland, Frederico Hamman, MD  fluticasone (FLONASE) 50 MCG/ACT nasal  spray INHALE 2 SPRAYS INTO EACH NOSTRIL EVERY DAY AS NEEDED Patient taking differently: INHALE 2 SPRAYS INTO EACH NOSTRIL EVERY DAY AS NEEDED FOR CONGESTION 07/08/16  Yes Copland, Frederico Hamman, MD  gabapentin (NEURONTIN) 300 MG capsule Take 1 capsule (300 mg total) by mouth 2 (two) times daily. 07/09/15  Yes Copland, Frederico Hamman, MD  ibuprofen (ADVIL,MOTRIN) 200 MG tablet Take 200-400 mg by mouth every 6 (six) hours as needed for headache (pain).   Yes [provider]  insulin regular human  CONCENTRATED (HUMULIN R) 500 UNIT/ML injection Inject under skin 100 units before b'fast, 60 units before lunch and 100 units before dinner Patient taking differently: Inject 75-100 Units into the skin See admin instructions. Inject 100 units (20 on insulin syringe) subcutaneously before breakfast, 75 units (15 on insulin syringe) before lunch and 100 units (20 on insulin syringe) before supper 07/05/16  Yes Philemon Kingdom, MD  lisinopril-hydrochlorothiazide (PRINZIDE,ZESTORETIC) 20-12.5 MG tablet TAKE ONE TABLET BY MOUTH ONCE DAILY. 08/22/15  Yes Copland, Frederico Hamman, MD  memantine (NAMENDA) 10 MG tablet Take 1 tablet (10 mg total) by mouth 2 (two) times daily. 06/29/16  Yes Marcial Pacas, MD  ranitidine (ZANTAC 75) 75 MG tablet Take 225 mg by mouth at bedtime.    Yes [provider]  sildenafil (REVATIO) 20 MG tablet Generic Revatio / Sildanefil 20 mg. 2 - 5 tabs 30 mins prior to intercourse. Patient taking differently: Take 80-100 mg by mouth daily as needed (erectile dysfunction - 30 minutes prior to intercourse).  05/28/15  Yes Copland, Frederico Hamman, MD  simvastatin (ZOCOR) 40 MG tablet TAKE ONE TABLET BY MOUTH AT BEDTIME 06/13/16  Yes Copland, Frederico Hamman, MD  testosterone cypionate (DEPOTESTOSTERONE CYPIONATE) 200 MG/ML injection INJECT 0.75MLS (150MG ) INTO THE MUSCLE Patient taking differently: INJECT 0.75MLS (150MG ) INTO THE MUSCLE EVERY 14 DAYS AT [facility] 2 06/14/16  Yes Copland, Frederico Hamman, MD  venlafaxine XR (EFFEXOR-XR) 75 MG 24 hr capsule TAKE THREE (3) CAPSULES BY MOUTH DAILY Patient taking differently: TAKE ONE CAPSULE (75 MG) BY MOUTH EVERY MORNING AND TWO CAPSULES (150 MG) AT NIGHT 05/21/16  Yes Copland, Frederico Hamman, MD    Family History Family History  Problem Relation Age of Onset  . Diabetes Mother   . Alzheimer's disease Mother   . Diabetes Father   . Lung cancer Father        Age 34  . Stroke Father   . Heart attack Father   . Lung cancer Sister        lung  . Heart disease  Maternal Grandfather   . COPD Sister   . Heart attack Sister   . Heart disease Sister     Social History Social History  Substance Use Topics  . Smoking status: Former Smoker    Packs/day: 3.00    Years: 35.00    Types: Cigarettes    Quit date: 04/12/2000  . Smokeless tobacco: Never Used  . Alcohol use No     Allergies   Tetracycline   Review of Systems Review of Systems  All other systems reviewed and are negative.    Physical Exam Updated Vital Signs BP (!) 154/77   Pulse 76   Temp 97.7 F (36.5 C) (Oral)   Resp 13   Ht 5\' 11"  (1.803 m)   Wt 108.9 kg (240 lb)   SpO2 97%   BMI 33.47 kg/m   Physical Exam  Constitutional: He is oriented to person, place, and time. He appears well-developed and well-nourished.  HENT:  Head: Normocephalic  and atraumatic.  Eyes: Conjunctivae are normal.  Neck: Neck supple.  Cardiovascular: Normal rate and regular rhythm.   Pulmonary/Chest: Effort normal and breath sounds normal.  Abdominal: Soft. Bowel sounds are normal.  Musculoskeletal: Normal range of motion.  Neurological: He is alert and oriented to person, place, and time.  Skin: Skin is warm and dry.  Psychiatric: He has a normal mood and affect. His behavior is normal.  Nursing note and vitals reviewed.    ED Treatments / Results  Labs (all labs ordered are listed, but only abnormal results are displayed) Labs Reviewed  CBC WITH DIFFERENTIAL/PLATELET - Abnormal; Notable for the following:       Result Value   Platelets 144 (*)    All other components within normal limits  COMPREHENSIVE METABOLIC PANEL - Abnormal; Notable for the following:    Sodium 133 (*)    Chloride 97 (*)    Glucose, Bld 234 (*)    BUN 24 (*)    Creatinine, Ser 2.07 (*)    ALT 14 (*)    GFR calc non Af Amer 31 (*)    GFR calc Af Amer 36 (*)    All other components within normal limits  TROPONIN I    EKG  EKG Interpretation  Date/Time:  Monday September 20 2016 15:28:13  EDT Ventricular Rate:  83 PR Interval:  166 QRS Duration: 90 QT Interval:  366 QTC Calculation: 430 R Axis:   69 Text Interpretation:  Normal sinus rhythm Normal ECG Confirmed by Nat Christen [phone]) on 09/20/2016 4:49:19 PM       Radiology Dg Chest 2 View  Result Date: 09/20/2016 CLINICAL DATA:  Left-sided chest pain and dizziness. EXAM: CHEST  2 VIEW COMPARISON:  11/08/2013 FINDINGS: The cardiac silhouette, mediastinal and hilar contours are within normal limits and stable. The lungs are clear. No pleural effusion. The bony thorax is intact. IMPRESSION: No acute cardiopulmonary findings. Electronically Signed   By: Marijo Sanes M.D.   On: 09/20/2016 16:36   Ct Head Wo Contrast  Result Date: 09/20/2016 CLINICAL DATA:  DIZZNESS X 1 WEEK WITH H/O OF FOUR FALLS. NO INJURY TO HEAD SUSTAINED IN FALLS. EXAM: CT HEAD WITHOUT CONTRAST TECHNIQUE: Contiguous axial images were obtained from the base of the skull through the vertex without intravenous contrast. COMPARISON:  Head CT and brain MRI dated 12/05/2014 FINDINGS: Brain: There is mild generalized age related parenchymal atrophy with commensurate dilatation of the ventricles and sulci. There is no mass, hemorrhage, edema or other evidence of acute parenchymal abnormality. No extra-axial hemorrhage. Vascular: There are chronic calcified atherosclerotic changes of the large vessels at the skull base. No unexpected hyperdense vessel. Skull: Normal. Negative for fracture or focal lesion. Sinuses/Orbits: No acute finding. Other: None. IMPRESSION: Negative head CT.  No intracranial mass, hemorrhage or edema. Electronically Signed   By: Franki Cabot M.D.   On: 09/20/2016 19:40    Procedures Procedures (including critical care time)  Medications Ordered in ED Medications - No data to display   Initial Impression / Assessment and Plan / ED Course  I have reviewed the triage vital signs and the nursing notes.  Pertinent labs & imaging results that  were available during my care of the patient were reviewed by me and considered in my medical decision making (see chart for details).     Patient is in no acute distress. Screening labs, troponin, EKG, chest x-ray, CT head all negative. He has primary care follow-up. This was discussed with  the patient and his daughter.  Final Clinical Impressions(s) / ED Diagnoses   Final diagnoses:  Chest pain, unspecified type    New Prescriptions New Prescriptions   No medications on file     Nat Christen, MD 09/20/16 2147    Nat Christen, MD 09/20/16 2148

## 2016-09-20 NOTE — Telephone Encounter (Signed)
High level of clinical concerns this patient with multiple medical: Problems. Ongoing chest pain, dizziness, bandlike pressure around his head, and falling. Given potential risk, think that the patient needs to be evaluated in a higher level of care than what we can provide in the office.

## 2016-09-20 NOTE — Telephone Encounter (Signed)
Patient's wife called regarding patient having balance issues. Per Katrina Dr. Rhea Belton nurse patient needs to see his PCP first and if needed then Dr. Krista Blue.

## 2016-09-20 NOTE — Telephone Encounter (Signed)
Patient Name: MALYK GIROUARD  DOB: [birth year]    Initial Comment Caller states her husband keeps falling. He went to the ER this weekend, fell and cut his hand. He keeps losing his balance, and he feels pressure around his head. Weakness.   Nurse Assessment  Nurse: Verlin Fester RN, [facility] Date/Time Eilene Ghazi Time): 09/20/2016 1:31:05 PM  Confirm and document reason for call. If symptomatic, describe symptoms. ---Caller states her husband keeps falling. He went to the ER this weekend, fell and cut his hand. He keeps losing his balance, and he feels pressure like a band around his head. Weakness.  Does the patient have any new or worsening symptoms? ---Yes  Will a triage be completed? ---Yes  Related visit to physician within the last 2 weeks? ---Yes  Does the PT have any chronic conditions? (i.e. diabetes, asthma, etc.) ---Yes  List chronic conditions. ---"diabetes, HTN,  Is this a behavioral health or substance abuse call? ---No     Guidelines    Guideline Title Affirmed Question Affirmed Notes  Chest Pain Dizziness or lightheadedness    Final Disposition User   Go to ED Now Verlin Fester, RN, [facility]    Referrals  GO TO FACILITY REFUSED   Disagree/Comply: Comply

## 2016-09-22 ENCOUNTER — Other Ambulatory Visit: Payer: Self-pay | Admitting: Pharmacist

## 2016-09-22 NOTE — Patient Outreach (Signed)
Call to follow up with [name] regarding his Humulin U-500 insulin and the [employer] Patient Assistance program. HIPAA identifiers verified and verbal consent received.  [name] reports that he has not yet met the out-of-pocket requirement for the [name] program. Reports that his copayment for the Humulin U-500 was $40 again this month.   [name] denies any further medication questions at this time. Let the patient know that I will call to follow up with him again next month.  [name], PharmD, [facility] [phone]

## 2016-09-28 NOTE — Progress Notes (Signed)
   Signed,  Maud Deed. Prachi Oftedahl, MD

## 2016-09-29 ENCOUNTER — Ambulatory Visit: Payer: Medicare Other

## 2016-09-29 ENCOUNTER — Encounter: Payer: Self-pay | Admitting: Family Medicine

## 2016-09-29 ENCOUNTER — Ambulatory Visit (INDEPENDENT_AMBULATORY_CARE_PROVIDER_SITE_OTHER): Payer: Medicare Other | Admitting: Family Medicine

## 2016-09-29 VITALS — BP 140/90 | HR 68 | Temp 98.4°F | Wt 236.0 lb

## 2016-09-29 DIAGNOSIS — E1165 Type 2 diabetes mellitus with hyperglycemia: Secondary | ICD-10-CM

## 2016-09-29 DIAGNOSIS — N183 Chronic kidney disease, stage 3 unspecified: Secondary | ICD-10-CM

## 2016-09-29 DIAGNOSIS — L089 Local infection of the skin and subcutaneous tissue, unspecified: Secondary | ICD-10-CM

## 2016-09-29 DIAGNOSIS — E1142 Type 2 diabetes mellitus with diabetic polyneuropathy: Secondary | ICD-10-CM | POA: Diagnosis not present

## 2016-09-29 DIAGNOSIS — E291 Testicular hypofunction: Secondary | ICD-10-CM

## 2016-09-29 DIAGNOSIS — E0841 Diabetes mellitus due to underlying condition with diabetic mononeuropathy: Secondary | ICD-10-CM

## 2016-09-29 DIAGNOSIS — IMO0002 Reserved for concepts with insufficient information to code with codable children: Secondary | ICD-10-CM

## 2016-09-29 DIAGNOSIS — T148XXA Other injury of unspecified body region, initial encounter: Secondary | ICD-10-CM

## 2016-09-29 MED ORDER — CEFTRIAXONE SODIUM 1 G IJ SOLR
1.0000 g | Freq: Once | INTRAMUSCULAR | Status: AC
  Administered 2016-09-29: 1 g via INTRAMUSCULAR

## 2016-09-29 MED ORDER — AMOXICILLIN-POT CLAVULANATE 875-125 MG PO TABS: 1.0000 | ORAL_TABLET | Freq: Two times a day (BID) | ORAL | 0 refills | Status: AC

## 2016-09-29 MED ORDER — TESTOSTERONE CYPIONATE 200 MG/ML IM SOLN
150.0000 mg | INTRAMUSCULAR | Status: DC
  Administered 2016-09-29 – 2016-12-22 (×3): 150 mg via INTRAMUSCULAR

## 2016-09-29 NOTE — Progress Notes (Signed)
Dr. Frederico Hamman T. Aaleah Hirsch, MD, [facility] [place], [address] Phone: [phone] Fax: [phone]  09/29/2016  Patient: [name], [name]: [id number], DOB: Dec 11, 1945, 71 y.o.  Primary Physician:  [name], MD   Chief Complaint  Patient presents with  . Fall    pt reports he has been losing his balance and dizziness with starting new insulin... symptoms improved after stopping insulin  . Laceration    on right hand x 2 weeks ago went to ED 09/21/2016   Subjective:   [name] is a 71 y.o. very pleasant male patient who presents with the following:  Dizziness and fall with starting new insulin.  Fell through some glass on the gun cabinet. This was on 09/17/2016, and at that point he fell through a glass door on his done cabinet and severely cut his hand as well as right middle finger, and lateral chest wall, sutured together and on the chest with staples.  Daughter-in-law took out all of her stitches at home. This was 2 days prior.  He feels better after switching back to his old type of insulin, and now he is not having any dizziness at all.  On the lower aspect of the distal forearm the wound appears to be opening somewhat, and distal to this at the wound there is some dark, almost black appearing color change. There is some surrounding redness.  Volar distal forearm. Large cut and open. On Novolin N and Novolin R - not steady.  224 BS last night.   Past Medical History, Surgical History, Social History, Family History, Problem List, Medications, and Allergies have been reviewed and updated if relevant.  Patient Active Problem List   Diagnosis Date Noted  . Uncontrolled type 2 diabetes mellitus with peripheral neuropathy ([place]) 05/11/2016    Priority: Medium  . Former very heavy cigarette smoker (more than 40 per day) 10/07/2014    Priority: Medium  . Chronic kidney disease, stage III (moderate)  12/19/2012    Priority: Medium  . OSA (obstructive sleep apnea) 03/09/2011    Priority: Medium  . Diabetic neuropathy ([place]) 12/29/2010    Priority: Medium  . Hyperlipidemia LDL goal <70 06/13/2008    Priority: Medium  . Major depressive disorder, recurrent episode, in partial remission ([place]) 06/13/2008    Priority: Medium  . Essential hypertension 06/13/2008    Priority: Medium  . Mild cognitive disorder 06/29/2016  . Erectile dysfunction associated with type 2 diabetes mellitus ([place]) 05/28/2015  . Hypogonadism male 06/14/2012  . Obesity (BMI 30-39.9) 09/27/2011  . History of smoking 09/16/2010  . Family history of MI (myocardial infarction) 09/16/2010  . HIP REPLACEMENT, RIGHT, HX OF 02/05/2010  . GOUT 06/13/2008  . ALLERGIC RHINITIS 06/13/2008  . GERD 06/13/2008  . OSTEOARTHRITIS 06/13/2008    Past Medical History:  Diagnosis Date  . Allergic rhinitis due to pollen   . Chronic kidney disease, stage III (moderate) 12/19/2012  . Depression   . Diabetes mellitus ([place])   . Diverticulosis   . Erectile dysfunction associated with type 2 diabetes mellitus ([place]) 05/28/2015  . Former very heavy cigarette smoker (more than 40 per day) 10/07/2014  . GERD (gastroesophageal reflux disease)   . Gout   . Headache   . Hyperlipidemia   . Hypertension   . Hypogonadism male 06/14/2012  . Iron deficiency   . Low testosterone   . Memory loss   . Osteoarthritis   .  Personal history of colonic polyps    1996    Past Surgical History:  Procedure Laterality Date  . CARDIAC CATHETERIZATION  11/2011   [facility]  . CHOLECYSTECTOMY  1980  . TOTAL HIP ARTHROPLASTY  2009   Dr. Gladstone Lighter  . TOTAL HIP ARTHROPLASTY      Social History   Social History  . Marital status: Married    Spouse name: N/A  . Number of children: 4  . Years of education: 12th   Occupational History  . Dispatcher   . retired truck driver [employer]  . Smoking status: Former Smoker     Packs/day: 3.00    Years: 35.00    Types: Cigarettes    Quit date: 04/12/2000  . Smokeless tobacco: Never Used  . Alcohol use No  . Drug use: No  . Sexual activity: No   Other Topics Concern  . Not on file   Social History Narrative   No regular exercise   Caffeine use: yes, dt coke   Lives at home with his wife and his mother lives with him.   Right-handed.       Family History  Problem Relation Age of Onset  . Diabetes Mother   . Alzheimer's disease Mother   . Diabetes Father   . Lung cancer Father        Age 37  . Stroke Father   . Heart attack Father   . Lung cancer Sister        lung  . Heart disease Maternal Grandfather   . COPD Sister   . Heart attack Sister   . Heart disease Sister     Allergies  Allergen Reactions  . Tetracycline Itching and Rash    Medication list reviewed and updated in full in [facility].   GEN: No acute illnesses, no fevers, chills. GI: No n/v/d, eating normally Pulm: No SOB Interactive and getting along well at home.  Otherwise, ROS is as per the HPI.  Objective:   BP 140/90   Pulse 68   Temp 98.4 F (36.9 C) (Oral)   Wt 236 lb (107 kg)   SpO2 96%   BMI 32.92 kg/m   GEN: WDWN, NAD, Non-toxic, A & O x 3 HEENT: Atraumatic, Normocephalic. Neck supple. No masses, No LAD. Ears and Nose: No external deformity. CV: RRR, No M/G/R. No JVD. No thrill. No extra heart sounds. PULM: CTA B, no wheezes, crackles, rhonchi. No retractions. No resp. distress. No accessory muscle use. EXTR: No c/c/e NEURO Normal gait.  PSYCH: Normally interactive. Conversant. Not depressed or anxious appearing.  Calm demeanor.   All wounds examined.  Right distal arm, volar aspect of the forearm is of most concern. The sutured wound appears to have separated somewhat, and there is on the distal most aspect of this some blackish coloration. There is no gross pus, or expressible contents. There is some surrounding redness. This is mildly warmth,  and I marked the area with a pen.  Laboratory and Imaging Data: Results for orders placed or performed during the hospital encounter of 09/20/16  CBC with Differential  Result Value Ref Range   WBC 9.8 4.0 - 10.5 K/uL   RBC 4.52 4.22 - 5.81 MIL/uL   Hemoglobin 14.0 13.0 - 17.0 g/dL   HCT 40.6 39.0 - 52.0 %   MCV 89.8 78.0 - 100.0 fL   MCH 31.0 26.0 - 34.0 pg   MCHC 34.5 30.0 -  36.0 g/dL   RDW 12.8 11.5 - 15.5 %   Platelets 144 (L) 150 - 400 K/uL   Neutrophils Relative % 73 %   Neutro Abs 7.3 1.7 - 7.7 K/uL   Lymphocytes Relative 16 %   Lymphs Abs 1.6 0.7 - 4.0 K/uL   Monocytes Relative 10 %   Monocytes Absolute 1.0 0.1 - 1.0 K/uL   Eosinophils Relative 1 %   Eosinophils Absolute 0.1 0.0 - 0.7 K/uL   Basophils Relative 0 %   Basophils Absolute 0.0 0.0 - 0.1 K/uL  Comprehensive metabolic panel  Result Value Ref Range   Sodium 133 (L) 135 - 145 mmol/L   Potassium 4.0 3.5 - 5.1 mmol/L   Chloride 97 (L) 101 - 111 mmol/L   CO2 28 22 - 32 mmol/L   Glucose, Bld 234 (H) 65 - 99 mg/dL   BUN 24 (H) 6 - 20 mg/dL   Creatinine, Ser 2.07 (H) 0.61 - 1.24 mg/dL   Calcium 8.9 8.9 - 10.3 mg/dL   Total Protein 6.5 6.5 - 8.1 g/dL   Albumin 3.7 3.5 - 5.0 g/dL   AST 18 15 - 41 U/L   ALT 14 (L) 17 - 63 U/L   Alkaline Phosphatase 64 38 - 126 U/L   Total Bilirubin 1.0 0.3 - 1.2 mg/dL   GFR calc non Af Amer 31 (L) >60 mL/min   GFR calc Af Amer 36 (L) >60 mL/min   Anion gap 8 5 - 15  Troponin I  Result Value Ref Range   Troponin I <0.03 <0.03 ng/mL    Lab Results  Component Value Date   HGBA1C 10.3 08/16/2016     Assessment and Plan:   Wound infection - Plan: Ambulatory referral to Wound Clinic, WOUND CULTURE, cefTRIAXone (ROCEPHIN) injection 1 g  Uncontrolled type 2 diabetes mellitus with peripheral neuropathy ([place]) - Plan: Ambulatory referral to Wound Clinic, WOUND CULTURE  Hypogonadism male - Plan: testosterone cypionate (DEPOTESTOSTERONE CYPIONATE) injection 150 mg  Diabetic  mononeuropathy associated with diabetes mellitus due to underlying condition (HCC)  Chronic kidney disease, stage III (moderate)  >40 minutes spent in face to face time with patient, >50% spent in counselling or coordination of care: Face-to-face consultation, coordination of care, follow-up, discussion with the patient face-to-face.  Poorly controlled diabetic with a significant distal forearm wound with some areas of apparent early necrosis. I attempted to get a wound culture.  He was given Rocephin 1 gram in the office.  Augmentin 875 by mouth twice a day.  Urgent wound clinic evaluation. He has an appointment scheduled for Monday, and is on the work and list if possible earlier than this.   Recheck on Friday with my partner Dr. Darnell Level, who also evaluated the wound today. The edges of redness were marked with a permanent marker today in the office.  Follow-up: Friday, 10/01/2016 with Dr. Darnell Level for wound check  Future Appointments Date Time Provider [place]  10/01/2016 2:15 PM Ria Bush, MD [facility]  10/04/2016 8:00 AM Woodroe Chen III, [name] [facility] None  10/20/2016 10:00 AM [place], Virl Diamond, [place] [facility] None  10/28/2016 12:00 PM Marcial Pacas, MD [facility] None  11/29/2016 4:15 PM Philemon Kingdom, MD [facility] None    Meds ordered this encounter  Medications  . amoxicillin-clavulanate (AUGMENTIN) 875-125 MG tablet    Sig: Take 1 tablet by mouth 2 (two) times daily.    Dispense:  20 tablet    Refill:  0  . cefTRIAXone (ROCEPHIN) injection  1 g  . testosterone cypionate (DEPOTESTOSTERONE CYPIONATE) injection 150 mg   There are no discontinued medications. Orders Placed This Encounter  Procedures  . WOUND CULTURE  . Ambulatory referral to Wound Clinic    Signed,  Maud Deed. Cejay Cambre, MD   Allergies as of 09/29/2016      Reactions   Tetracycline Itching, Rash      Medication List       Accurate as of 09/29/16  1:35 PM. Always use your most  recent med list.          amoxicillin 500 MG capsule Commonly known as:  AMOXIL Take 500 mg by mouth daily. #30 filled 08/26/16   amoxicillin-clavulanate 875-125 MG tablet Commonly known as:  AUGMENTIN Take 1 tablet by mouth 2 (two) times daily.   aspirin EC 81 MG tablet Take 81 mg by mouth at bedtime.   atenolol 100 MG tablet Commonly known as:  TENORMIN TAKE ONE TABLET BY MOUTH ONCE DAILY.   buPROPion 150 MG 12 hr tablet Commonly known as:  WELLBUTRIN SR Take 1 tablet (150 mg total) by mouth 2 (two) times daily.   cholecalciferol 1000 units tablet Commonly known as:  VITAMIN D Take 1,000 Units by mouth daily.   donepezil 10 MG tablet Commonly known as:  ARICEPT Take 1 tablet (10 mg total) by mouth at bedtime.   doxazosin 8 MG tablet Commonly known as:  CARDURA TAKE ONE TABLET BY MOUTH AT BEDTIME   fluticasone 50 MCG/ACT nasal spray Commonly known as:  FLONASE INHALE 2 SPRAYS INTO EACH NOSTRIL EVERY DAY AS NEEDED   gabapentin 300 MG capsule Commonly known as:  NEURONTIN Take 1 capsule (300 mg total) by mouth 2 (two) times daily.   ibuprofen 200 MG tablet Commonly known as:  ADVIL,MOTRIN Take 200-400 mg by mouth every 6 (six) hours as needed for headache (pain).   insulin regular human CONCENTRATED 500 UNIT/ML injection Commonly known as:  HUMULIN R Inject under skin 100 units before b'fast, 60 units before lunch and 100 units before dinner   lisinopril-hydrochlorothiazide 20-12.5 MG tablet Commonly known as:  PRINZIDE,ZESTORETIC TAKE ONE TABLET BY MOUTH ONCE DAILY.   memantine 10 MG tablet Commonly known as:  NAMENDA Take 1 tablet (10 mg total) by mouth 2 (two) times daily.   sildenafil 20 MG tablet Commonly known as:  REVATIO Generic Revatio / Sildanefil 20 mg. 2 - 5 tabs 30 mins prior to intercourse.   simvastatin 40 MG tablet Commonly known as:  ZOCOR TAKE ONE TABLET BY MOUTH AT BEDTIME   testosterone cypionate 200 MG/ML injection Commonly known  as:  DEPOTESTOSTERONE CYPIONATE INJECT 0.75MLS (150MG ) INTO THE MUSCLE   venlafaxine XR 75 MG 24 hr capsule Commonly known as:  EFFEXOR-XR TAKE THREE (3) CAPSULES BY MOUTH DAILY   ZANTAC 75 75 MG tablet Generic drug:  ranitidine Take 225 mg by mouth at bedtime.

## 2016-09-29 NOTE — Patient Instructions (Signed)
REFERRALS TO SPECIALISTS, SPECIAL TESTS (MRI, CT, ULTRASOUNDS)  [facility] or [facility] will help you. ASK CHECK-IN FOR HELP.  Imaging / Special Testing referrals sometimes can be done same day if EMERGENCY, but others can take 2 or 3 days to get an appointment. Starting in 2015, many of the new Medicare plans and Obamacare plans take much longer.   Specialist appointment times vary a great deal, based on their schedule / openings. -- Some specialists have very long wait times. (Example. Dermatology. Multiple months  for non-cancer)   

## 2016-10-01 ENCOUNTER — Other Ambulatory Visit: Payer: Self-pay | Admitting: Family Medicine

## 2016-10-01 ENCOUNTER — Encounter: Payer: Self-pay | Admitting: Family Medicine

## 2016-10-01 ENCOUNTER — Ambulatory Visit (INDEPENDENT_AMBULATORY_CARE_PROVIDER_SITE_OTHER): Payer: Medicare Other | Admitting: Family Medicine

## 2016-10-01 VITALS — BP 162/80 | HR 93 | Temp 98.5°F | Wt 234.5 lb

## 2016-10-01 DIAGNOSIS — L089 Local infection of the skin and subcutaneous tissue, unspecified: Secondary | ICD-10-CM | POA: Diagnosis not present

## 2016-10-01 DIAGNOSIS — E0841 Diabetes mellitus due to underlying condition with diabetic mononeuropathy: Secondary | ICD-10-CM | POA: Diagnosis not present

## 2016-10-01 DIAGNOSIS — IMO0002 Reserved for concepts with insufficient information to code with codable children: Secondary | ICD-10-CM

## 2016-10-01 DIAGNOSIS — S61401D Unspecified open wound of right hand, subsequent encounter: Secondary | ICD-10-CM | POA: Diagnosis not present

## 2016-10-01 DIAGNOSIS — E1142 Type 2 diabetes mellitus with diabetic polyneuropathy: Secondary | ICD-10-CM

## 2016-10-01 DIAGNOSIS — T148XXA Other injury of unspecified body region, initial encounter: Secondary | ICD-10-CM

## 2016-10-01 DIAGNOSIS — E1165 Type 2 diabetes mellitus with hyperglycemia: Secondary | ICD-10-CM

## 2016-10-01 MED ORDER — SULFAMETHOXAZOLE-TRIMETHOPRIM 800-160 MG PO TABS: 1.0000 | ORAL_TABLET | Freq: Two times a day (BID) | ORAL | 0 refills | Status: DC

## 2016-10-01 MED ORDER — LISINOPRIL-HYDROCHLOROTHIAZIDE 20-12.5 MG PO TABS: 1.0000 | ORAL_TABLET | Freq: Every day | ORAL | 1 refills | Status: DC

## 2016-10-01 NOTE — Progress Notes (Signed)
BP (!) 162/80   Pulse 93   Temp 98.5 F (36.9 C)   Wt 234 lb 8 oz (106.4 kg)   SpO2 94%   BMI 32.71 kg/m    CC: f/u visit Subjective:    Patient ID: [name], male    DOB: [birth year], 71 y.o.   [name]: [id number]  HPI: [name] is a 71 y.o. male presenting on 10/01/2016 for Follow-up   See prior note for details. Pleasant patient of Dr Lorelei Pont with known uncontrolled diabetes presents for wound check. He suffered fall through glass 09/17/2016 with wrist and 4th finger laceration s/p suturing by ER. Suture were removed by DIL 10 days later. Poorly healing, concern for wound infection, with some necrosis of distal edge of wound along with surrounding redness. No significant drainage. He also cut R upper lateral chest just below axilla. Pt saw Dr Lorelei Pont 6/20 - wound culture obtained, growing abundant staph aureus. Edges were delienated. Planned wound clinic evaluation on Monday. Here for recheck prior to weekend.   He was given rocephin 1gm IM last visit, and started on augmentin course.   Denies fevers/chlils, nausea or other systemic symptoms.  He has been cleaning with peroxide.   Lab Results  Component Value Date   HGBA1C 10.3 08/16/2016  Sugars remain very high. Followed by Dr Cruzita Lederer endo.  Compliant with insulin.   Relevant past medical, surgical, family and social history reviewed and updated as indicated. Interim medical history since our last visit reviewed. Allergies and medications reviewed and updated. Outpatient Medications Prior to Visit  Medication Sig Dispense Refill  . amoxicillin (AMOXIL) 500 MG capsule Take 500 mg by mouth daily. #30 filled 08/26/16    . amoxicillin-clavulanate (AUGMENTIN) 875-125 MG tablet Take 1 tablet by mouth 2 (two) times daily. 20 tablet 0  . aspirin EC 81 MG tablet Take 81 mg by mouth at bedtime.    [name] Kitchen buPROPion (WELLBUTRIN SR) 150 MG 12 hr tablet Take 1 tablet (150 mg total) by mouth 2 (two) times daily. 60 tablet 5  .  cholecalciferol (VITAMIN D) 1000 UNITS tablet Take 1,000 Units by mouth daily.      [name] Kitchen donepezil (ARICEPT) 10 MG tablet Take 1 tablet (10 mg total) by mouth at bedtime. 30 tablet 11  . doxazosin (CARDURA) 8 MG tablet TAKE ONE TABLET BY MOUTH AT BEDTIME 90 tablet 3  . fluticasone (FLONASE) 50 MCG/ACT nasal spray INHALE 2 SPRAYS INTO EACH NOSTRIL EVERY DAY AS NEEDED (Patient taking differently: INHALE 2 SPRAYS INTO EACH NOSTRIL EVERY DAY AS NEEDED FOR CONGESTION) 16 g 11  . gabapentin (NEURONTIN) 300 MG capsule Take 1 capsule (300 mg total) by mouth 2 (two) times daily. 90 capsule 3  . ibuprofen (ADVIL,MOTRIN) 200 MG tablet Take 200-400 mg by mouth every 6 (six) hours as needed for headache (pain).    . insulin regular human CONCENTRATED (HUMULIN R) 500 UNIT/ML injection Inject under skin 100 units before b'fast, 60 units before lunch and 100 units before dinner (Patient taking differently: Inject 75-100 Units into the skin See admin instructions. Inject 100 units (20 on insulin syringe) subcutaneously before breakfast, 75 units (15 on insulin syringe) before lunch and 100 units (20 on insulin syringe) before supper) 20 mL 5  . memantine (NAMENDA) 10 MG tablet Take 1 tablet (10 mg total) by mouth 2 (two) times daily. 60 tablet 11  . ranitidine (ZANTAC 75) 75 MG tablet Take 225 mg by mouth at bedtime.     [name] Kitchen  sildenafil (REVATIO) 20 MG tablet Generic Revatio / Sildanefil 20 mg. 2 - 5 tabs 30 mins prior to intercourse. (Patient taking differently: Take 80-100 mg by mouth daily as needed (erectile dysfunction - 30 minutes prior to intercourse). ) 10 tablet prn  . simvastatin (ZOCOR) 40 MG tablet TAKE ONE TABLET BY MOUTH AT BEDTIME 30 tablet 11  . testosterone cypionate (DEPOTESTOSTERONE CYPIONATE) 200 MG/ML injection INJECT 0.75MLS (150MG ) INTO THE MUSCLE (Patient taking differently: INJECT 0.75MLS (150MG ) INTO THE MUSCLE EVERY 14 DAYS AT [facility]) 10 mL 1  . venlafaxine XR (EFFEXOR-XR) 75 MG 24 hr  capsule TAKE THREE (3) CAPSULES BY MOUTH DAILY (Patient taking differently: TAKE ONE CAPSULE (75 MG) BY MOUTH EVERY MORNING AND TWO CAPSULES (150 MG) AT NIGHT) 270 capsule 1  . atenolol (TENORMIN) 100 MG tablet TAKE ONE TABLET BY MOUTH ONCE DAILY. 90 tablet 1  . lisinopril-hydrochlorothiazide (PRINZIDE,ZESTORETIC) 20-12.5 MG tablet TAKE ONE TABLET BY MOUTH ONCE DAILY. 90 tablet 1   Facility-Administered Medications Prior to Visit  Medication Dose Route Frequency Provider Last Rate Last Dose  . testosterone cypionate (DEPOTESTOSTERONE CYPIONATE) injection 150 mg  150 mg Intramuscular Q14 Days Copland, Spencer, MD   150 mg at 08/31/16 1557  . testosterone cypionate (DEPOTESTOSTERONE CYPIONATE) injection 150 mg  150 mg Intramuscular Q14 Days Copland, Spencer, MD   150 mg at 09/29/16 1059     Per HPI unless specifically indicated in ROS section below Review of Systems     Objective:    BP (!) 162/80   Pulse 93   Temp 98.5 F (36.9 C)   Wt 234 lb 8 oz (106.4 kg)   SpO2 94%   BMI 32.71 kg/m   Wt Readings from Last 3 Encounters:  10/01/16 234 lb 8 oz (106.4 kg)  09/29/16 236 lb (107 kg)  09/20/16 240 lb (108.9 kg)    Physical Exam  Constitutional: He appears well-developed and well-nourished. No distress.  Skin: There is erythema.  Right volar wrist wound with dehiscence of skin edges, black color to distal skin flap, surrounding erythema still within delineated area. No frank pus.  Right palmar 4th digit along PIP second wound with erythema and dehiscence  Psychiatric: He has a normal mood and affect.  Nursing note and vitals reviewed.  Wound soaked in betadine infused sterile water then rinsed with sterile water, wound dressed with triple abx ointment and then re-wrapped. Home wound care instructions provided.   Results for orders placed or performed in visit on 09/29/16  WOUND CULTURE  Result Value Ref Range   Culture STAPHYLOCOCCUS AUREUS    Gram Stain No WBC Seen    Gram  Stain No Squamous Epithelial Cells Seen    Gram Stain Few Gram Positive Cocci In Pairs    Colony Count Abundant    Organism ID, Bacteria STAPHYLOCOCCUS AUREUS       Susceptibility   Staphylococcus aureus -  (no method available)    OXACILLIN <=0.25 Sensitive     CEFAZOLIN  Sensitive     GENTAMICIN <=0.5 Sensitive     CIPROFLOXACIN <=0.5 Sensitive     LEVOFLOXACIN <=0.12 Sensitive     MOXIFLOXACIN <=0.25 Sensitive     TRIMETH/SULFA <=10 Sensitive     VANCOMYCIN <=0.5 Sensitive     CLINDAMYCIN <=0.25 Sensitive     ERYTHROMYCIN <=0.25 Sensitive     LINEZOLID 2 Sensitive     QUINUPRISTIN/DALF <=0.25 Sensitive     RIFAMPIN <=0.5 Sensitive     TETRACYCLINE <=1 Sensitive  Assessment & Plan:   Problem List Items Addressed This Visit    Diabetic neuropathy ([place]) (Chronic)   Relevant Medications   lisinopril-hydrochlorothiazide (PRINZIDE,ZESTORETIC) 20-12.5 MG tablet   Hand, open wound except fingers, complicated, right, subsequent encounter - Primary    Recent laceration s/p suturing, poorly healing due to wound infection. Both wrist and 4th digit wound cleaned and dressed today. May need further treatment/debridement at wound center for likely early necrotic wound. rec stop peroxide rinses, discussed further home care. Has appt at [facility] wound clinic on Monday. As wound culture growing staph aureus, will broaden abx coverage with addition of bactrim to include MRSA, continue augmentin. Pt agrees with plan. Discussed red flags to seek urgent care over weekend.       Uncontrolled type 2 diabetes mellitus with peripheral neuropathy (HCC) (Chronic)    Encouraged sooner f/u with endo.       Relevant Medications   lisinopril-hydrochlorothiazide (PRINZIDE,ZESTORETIC) 20-12.5 MG tablet   Wound infection, posttraumatic   Relevant Medications   sulfamethoxazole-trimethoprim (BACTRIM DS,SEPTRA DS) 800-160 MG tablet       Follow up plan: Return if symptoms worsen or fail to  improve.  Ria Bush, MD

## 2016-10-01 NOTE — Patient Instructions (Signed)
Continue augmentin. Add bactim twice daily - sent to pharmacy. Take antibiotics with food Back off hydrogen peroxide. Start soapy water bath, rinse and pat dry.  Also use antibiotic ointment to wound then wrap and dress daily. Keep wound clinic appointment on Monday morning. If fever, worsening spreading redness and pain, or other systemic symptoms like nausea/vomiting, malaise, seek urgent care over weekend.

## 2016-10-02 ENCOUNTER — Encounter: Payer: Self-pay | Admitting: Family Medicine

## 2016-10-02 DIAGNOSIS — L089 Local infection of the skin and subcutaneous tissue, unspecified: Secondary | ICD-10-CM | POA: Insufficient documentation

## 2016-10-02 DIAGNOSIS — T148XXA Other injury of unspecified body region, initial encounter: Secondary | ICD-10-CM

## 2016-10-02 LAB — WOUND CULTURE
Gram Stain: NONE SEEN
Gram Stain: NONE SEEN

## 2016-10-02 NOTE — Assessment & Plan Note (Signed)
Encouraged sooner f/u with endo.

## 2016-10-02 NOTE — Assessment & Plan Note (Addendum)
Recent laceration s/p suturing, poorly healing due to wound infection. Both wrist and 4th digit wound cleaned and dressed today. May need further treatment/debridement at wound center for likely early necrotic wound. rec stop peroxide rinses, discussed further home care. Has appt at [facility] wound clinic on Monday. As wound culture growing staph aureus, will broaden abx coverage with addition of bactrim to include MRSA, continue augmentin. Pt agrees with plan. Discussed red flags to seek urgent care over weekend.

## 2016-10-04 ENCOUNTER — Encounter: Payer: Medicare Other | Attending: Physician Assistant | Admitting: Physician Assistant

## 2016-10-04 ENCOUNTER — Ambulatory Visit: Payer: Medicare Other | Admitting: Neurology

## 2016-10-04 DIAGNOSIS — E1140 Type 2 diabetes mellitus with diabetic neuropathy, unspecified: Secondary | ICD-10-CM | POA: Diagnosis not present

## 2016-10-04 DIAGNOSIS — I10 Essential (primary) hypertension: Secondary | ICD-10-CM | POA: Diagnosis not present

## 2016-10-04 DIAGNOSIS — F0390 Unspecified dementia without behavioral disturbance: Secondary | ICD-10-CM | POA: Diagnosis not present

## 2016-10-04 DIAGNOSIS — E11622 Type 2 diabetes mellitus with other skin ulcer: Secondary | ICD-10-CM | POA: Diagnosis not present

## 2016-10-04 DIAGNOSIS — W19XXXA Unspecified fall, initial encounter: Secondary | ICD-10-CM | POA: Insufficient documentation

## 2016-10-04 DIAGNOSIS — M1990 Unspecified osteoarthritis, unspecified site: Secondary | ICD-10-CM | POA: Diagnosis not present

## 2016-10-04 DIAGNOSIS — L98492 Non-pressure chronic ulcer of skin of other sites with fat layer exposed: Secondary | ICD-10-CM | POA: Insufficient documentation

## 2016-10-04 DIAGNOSIS — S41101A Unspecified open wound of right upper arm, initial encounter: Secondary | ICD-10-CM | POA: Insufficient documentation

## 2016-10-04 DIAGNOSIS — Z8673 Personal history of transient ischemic attack (TIA), and cerebral infarction without residual deficits: Secondary | ICD-10-CM | POA: Diagnosis not present

## 2016-10-04 DIAGNOSIS — Z794 Long term (current) use of insulin: Secondary | ICD-10-CM | POA: Insufficient documentation

## 2016-10-04 DIAGNOSIS — M109 Gout, unspecified: Secondary | ICD-10-CM | POA: Diagnosis not present

## 2016-10-04 DIAGNOSIS — Z87891 Personal history of nicotine dependence: Secondary | ICD-10-CM | POA: Insufficient documentation

## 2016-10-04 DIAGNOSIS — S61511A Laceration without foreign body of right wrist, initial encounter: Secondary | ICD-10-CM | POA: Diagnosis not present

## 2016-10-05 NOTE — Progress Notes (Signed)
DONTRAE, MORINI ([id number]) Visit Report for 10/04/2016 Abuse/Suicide Risk Screen Details Patient Name: [name], [name] Date of Service: 10/04/2016 8:00 AM Medical Record Number: [id number] Patient Account Number: [id number] Date of Birth/Sex: Feb 01, 1946 (71 y.o. Male) Treating RN: Montey Hora Primary Care Lupe Bonner: [name] Other Clinician: Referring Ani Deoliveira: [name] Treating Kaeden Depaz/Extender: [name], HOYT Weeks in Treatment: 0 Abuse/Suicide Risk Screen Items Answer ABUSE/SUICIDE RISK SCREEN: Has anyone close to you tried to hurt or harm you recentlyo No Do you feel uncomfortable with anyone in your familyo No Has anyone forced you do things that you didnot want to doo No Do you have any thoughts of harming yourselfo No Patient displays signs or symptoms of abuse and/or neglect. No Electronic Signature(s) Signed: 10/04/2016 5:16:03 PM By: Montey Hora Entered By: Montey Hora on 10/04/2016 08:17:55 [name] ([id number]) -------------------------------------------------------------------------------- Activities of Daily Living Details Patient Name: [name] Date of Service: 10/04/2016 8:00 AM Medical Record Number: [id number] Patient Account Number: [id number] Date of Birth/Sex: 01/29/1946 (71 y.o. Male) Treating RN: Montey Hora Primary Care Giani Betzold: [name] Other Clinician: Referring Emmanual Gauthreaux: [name] Treating Heather Streeper/Extender: Melburn Hake, HOYT Weeks in Treatment: 0 Activities of Daily Living Items Answer Activities of Daily Living (Please select one for each item) Drive Automobile Not Able Take Medications Need Assistance Use Telephone Completely Able Care for Appearance Completely Able Use Toilet Completely Able Bath / Shower Need Assistance Dress Self Completely Able Feed Self Completely Able Walk Need Assistance Get In / Out Bed Need Assistance Housework Need Assistance Prepare Meals Need  Assistance Handle Money Completely Able Shop for Self Need Assistance Electronic Signature(s) Signed: 10/04/2016 5:16:03 PM By: Montey Hora Entered By: Montey Hora on 10/04/2016 08:18:25 [name] ([id number]) -------------------------------------------------------------------------------- Education Assessment Details Patient Name: [name] Date of Service: 10/04/2016 8:00 AM Medical Record Number: [id number] Patient Account Number: [id number] Date of Birth/Sex: Nov 16, 1945 (70 y.o. Male) Treating RN: Montey Hora Primary Care Remmie Bembenek: [name] Other Clinician: Referring Pelagia Iacobucci: [name] Treating Dunbar Buras/Extender: Melburn Hake, HOYT Weeks in Treatment: 0 Primary Learner Assessed: Caregiver Reason Patient is not Primary Learner: wound location Learning Preferences/Education Level/Primary Language Learning Preference: Explanation, Demonstration Highest Education Level: High School Preferred Language: English Cognitive Barrier Assessment/Beliefs Language Barrier: No Translator Needed: No Memory Deficit: No Emotional Barrier: No Cultural/Religious Beliefs Affecting Medical No Care: Physical Barrier Assessment Impaired Vision: No Impaired Hearing: No Decreased Hand dexterity: No Knowledge/Comprehension Assessment Knowledge Level: Medium Comprehension Level: Medium Ability to understand written Medium instructions: Ability to understand verbal Medium instructions: Motivation Assessment Anxiety Level: Calm Cooperation: Cooperative Education Importance: Acknowledges Need Interest in Health Problems: Asks Questions Perception: Coherent Willingness to Engage in Self- Medium Management Activities: Readiness to Engage in Self- Medium Management Activities: ABDURAHMAN, RUGG ([id number]) Electronic Signature(s) Signed: 10/04/2016 5:16:03 PM By: Montey Hora Entered By: Montey Hora on 10/04/2016 08:18:58 [name]  ([id number]) -------------------------------------------------------------------------------- Fall Risk Assessment Details Patient Name: [name] Date of Service: 10/04/2016 8:00 AM Medical Record Number: [id number] Patient Account Number: [id number] Date of Birth/Sex: 06-16-1945 (70 y.o. Male) Treating RN: Montey Hora Primary Care Daje Stark: [name] Other Clinician: Referring Elveria Lauderbaugh: [name] Treating Zahniya Zellars/Extender: Melburn Hake, HOYT Weeks in Treatment: 0 Fall Risk Assessment Items Have you had 2 or more falls in the last 12 monthso 0 Yes Have you had any fall that resulted in injury in the last 12 monthso 0 Yes FALL RISK ASSESSMENT: History of falling - immediate or within 3 months 25 Yes Secondary diagnosis 0  No Ambulatory aid None/bed rest/wheelchair/nurse 0 No Crutches/cane/walker 15 Yes Furniture 0 No IV Access/Saline Lock 0 No Gait/Training Normal/bed rest/immobile 0 No Weak 10 Yes Impaired 0 No Mental Status Oriented to own ability 0 Yes Electronic Signature(s) Signed: 10/04/2016 5:16:03 PM By: Montey Hora Entered By: Montey Hora on 10/04/2016 08:19:10 [name] ([id number]) -------------------------------------------------------------------------------- Nutrition Risk Assessment Details Patient Name: [name] Date of Service: 10/04/2016 8:00 AM Medical Record Number: [id number] Patient Account Number: [id number] Date of Birth/Sex: [birth year] (70 y.o. Male) Treating RN: Montey Hora Primary Care Kiala Faraj: [name] Other Clinician: Referring Kyeshia Zinn: [name] Treating Jamahl Lemmons/Extender: Melburn Hake, HOYT Weeks in Treatment: 0 Height (in): 70 Weight (lbs): 241 Body Mass Index (BMI): 34.6 Nutrition Risk Assessment Items NUTRITION RISK SCREEN: I have an illness or condition that made me change the kind and/or 0 No amount of food I eat I eat fewer than two meals per day 0 No I eat few fruits and  vegetables, or milk products 0 No I have three or more drinks of beer, liquor or wine almost every day 0 No I have tooth or mouth problems that make it hard for me to eat 0 No I don't always have enough money to buy the food I need 0 No I eat alone most of the time 0 No I take three or more different prescribed or over-the-counter drugs a 1 Yes day Without wanting to, I have lost or gained 10 pounds in the last six 0 No months I am not always physically able to shop, cook and/or feed myself 0 No Nutrition Protocols Good Risk Protocol 0 No interventions needed Moderate Risk Protocol Electronic Signature(s) Signed: 10/04/2016 5:16:03 PM By: Montey Hora Entered By: Montey Hora on 10/04/2016 08:19:18

## 2016-10-05 NOTE — Progress Notes (Signed)
ASIAH, BEFORT ([id number]) Visit Report for 10/04/2016 Allergy List Details Patient Name: MONTEY, EBEL Date of Service: 10/04/2016 8:00 AM Medical Record Number: [id number] Patient Account Number: [id number] Date of Birth/Sex: 07-07-1945 (71 y.o. Male) Treating RN: Montey Hora Primary Care Murdock Jellison: [name] Other Clinician: Referring Cristie Mckinney: [name] Treating Deshane Cotroneo/Extender: [name], HOYT Weeks in Treatment: 0 Allergies Active Allergies tetracycline Allergy Notes Electronic Signature(s) Signed: 10/04/2016 5:16:03 PM By: Montey Hora Entered By: Montey Hora on 10/04/2016 08:17:46 [name] ([id number]) -------------------------------------------------------------------------------- Arrival Information Details Patient Name: [name] Date of Service: 10/04/2016 8:00 AM Medical Record Number: [id number] Patient Account Number: [id number] Date of Birth/Sex: [birth year] (70 y.o. Male) Treating RN: Montey Hora Primary Care Margot Oriordan: [name] Other Clinician: Referring Vernesha Talbot: [name] Treating Jelitza Manninen/Extender: Melburn Hake, HOYT Weeks in Treatment: 0 Visit Information Patient Arrived: Walker Arrival Time: 08:13 Accompanied By: grandson Transfer Assistance: None Patient Identification Verified: Yes Secondary Verification Process Yes Completed: Patient Has Alerts: Yes Patient Alerts: DMII Electronic Signature(s) Signed: 10/04/2016 5:16:03 PM By: Montey Hora Entered By: Montey Hora on 10/04/2016 08:13:56 [name] ([id number]) -------------------------------------------------------------------------------- Clinic Level of Care Assessment Details Patient Name: [name] Date of Service: 10/04/2016 8:00 AM Medical Record Number: [id number] Patient Account Number: [id number] Date of Birth/Sex: [birth year] (70 y.o. Male) Treating RN: Montey Hora Primary Care Broden Holt: [name] Other  Clinician: Referring Fradel Baldonado: [name] Treating Awanda Wilcock/Extender: Melburn Hake, HOYT Weeks in Treatment: 0 Clinic Level of Care Assessment Items TOOL 1 Quantity Score []  - Use when EandM and Procedure is performed on INITIAL visit 0 ASSESSMENTS - Nursing Assessment / Reassessment X - General Physical Exam (combine w/ comprehensive assessment (listed just 1 20 below) when performed on new pt. evals) X - Comprehensive Assessment (HX, ROS, Risk Assessments, Wounds Hx, etc.) 1 25 ASSESSMENTS - Wound and Skin Assessment / Reassessment []  - Dermatologic / Skin Assessment (not related to wound area) 0 ASSESSMENTS - Ostomy and/or Continence Assessment and Care []  - Incontinence Assessment and Management 0 []  - Ostomy Care Assessment and Management (repouching, etc.) 0 PROCESS - Coordination of Care X - Simple Patient / Family Education for ongoing care 1 15 []  - Complex (extensive) Patient / Family Education for ongoing care 0 X - Staff obtains Programmer, systems, Records, Test Results / Process Orders 1 10 []  - Staff telephones HHA, Nursing Homes / Clarify orders / etc 0 []  - Routine Transfer to another Facility (non-emergent condition) 0 []  - Routine Hospital Admission (non-emergent condition) 0 X - New Admissions / Biomedical engineer / Ordering NPWT, Apligraf, etc. 1 15 []  - Emergency Hospital Admission (emergent condition) 0 PROCESS - Special Needs []  - Pediatric / Minor Patient Management 0 []  - Isolation Patient Management 0 ELDRIGE, PITKIN ([id number]) []  - Hearing / Language / Visual special needs 0 []  - Assessment of Community assistance (transportation, D/C planning, etc.) 0 []  - Additional assistance / Altered mentation 0 []  - Support Surface(s) Assessment (bed, cushion, seat, etc.) 0 INTERVENTIONS - Miscellaneous []  - External ear exam 0 []  - Patient Transfer (multiple staff / Civil Service fast streamer / Similar devices) 0 []  - Simple Staple / Suture removal (25 or less) 0 []  - Complex  Staple / Suture removal (26 or more) 0 []  - Hypo/Hyperglycemic Management (do not check if billed separately) 0 []  - Ankle / Brachial Index (ABI) - do not check if billed separately 0 Has the patient been seen at the hospital within the last three years: Yes Total Score: 85  Level Of Care: New/Established - Level 3 Electronic Signature(s) Signed: 10/04/2016 5:16:03 PM By: Montey Hora Entered By: Montey Hora on 10/04/2016 10:17:13 [name] ([id number]) -------------------------------------------------------------------------------- Encounter Discharge Information Details Patient Name: [name] Date of Service: 10/04/2016 8:00 AM Medical Record Number: [id number] Patient Account Number: [id number] Date of Birth/Sex: 16-Jun-1945 (70 y.o. Male) Treating RN: Montey Hora Primary Care Jaylee Freeze: [name] Other Clinician: Referring Myer Bohlman: [name] Treating Joci Dress/Extender: Melburn Hake, HOYT Weeks in Treatment: 0 Encounter Discharge Information Items Discharge Pain Level: 0 Discharge Condition: Stable Ambulatory Status: Walker Discharge Destination: Home Transportation: Private Auto Accompanied By: grandson Schedule Follow-up Appointment: Yes Medication Reconciliation completed No and provided to Patient/Care Ervie Mccard: Provided on Clinical Summary of Care: 10/04/2016 Form Type Recipient Paper Patient JJ Electronic Signature(s) Signed: 10/04/2016 9:25:50 AM By: Ruthine Dose Entered By: Ruthine Dose on 10/04/2016 09:25:50 [name] ([id number]) -------------------------------------------------------------------------------- Multi Wound Chart Details Patient Name: [name] Date of Service: 10/04/2016 8:00 AM Medical Record Number: [id number] Patient Account Number: [id number] Date of Birth/Sex: [birth year] (70 y.o. Male) Treating RN: Montey Hora Primary Care Eldine Rencher: [name] Other Clinician: Referring Correy Weidner:  [name] Treating Amiee Wiley/Extender: [name], HOYT Weeks in Treatment: 0 Vital Signs Height(in): 70 Pulse(bpm): 81 Weight(lbs): 241 Blood Pressure 124/58 (mmHg): Body Mass Index(BMI): 35 Temperature(F): 97.6 Respiratory Rate 18 (breaths/min): Photos: [1:No Photos] [2:No Photos] [N/A:N/A] Wound Location: [1:Right Wrist - Posterior] [2:Right Hand - 4th Digit] [N/A:N/A] Wounding Event: [1:Trauma] [2:Trauma] [N/A:N/A] Primary Etiology: [1:Trauma, Other] [2:Trauma, Other] [N/A:N/A] Comorbid History: [1:Hypertension, Type II Diabetes, Gout, Osteoarthritis, Dementia, Neuropathy] [2:Hypertension, Type II Diabetes, Gout, Osteoarthritis, Dementia, Neuropathy] [N/A:N/A] Date Acquired: [1:09/20/2016] [2:09/20/2016] [N/A:N/A] Weeks of Treatment: [1:0] [2:0] [N/A:N/A] Wound Status: [1:Open] [2:Open] [N/A:N/A] Measurements L x W x D 7x2.5x0.3 [2:2x1x0.1] [N/A:N/A] (cm) Area (cm) : [1:13.744] [2:1.571] [N/A:N/A] Volume (cm) : [1:4.123] [2:0.157] [N/A:N/A] Classification: [1:Full Thickness Without Exposed Support Structures] [2:Partial Thickness] [N/A:N/A] Exudate Amount: [1:Large] [2:Large] [N/A:N/A] Exudate Type: [1:Serous] [2:Serous] [N/A:N/A] Exudate Color: [1:amber] [2:amber] [N/A:N/A] Wound Margin: [1:Flat and Intact] [2:Flat and Intact] [N/A:N/A] Granulation Amount: [1:Small (1-33%)] [2:Large (67-100%)] [N/A:N/A] Granulation Quality: [1:Pink] [2:Red] [N/A:N/A] Necrotic Amount: [1:Large (67-100%)] [2:Small (1-33%)] [N/A:N/A] Necrotic Tissue: [1:Eschar, Adherent Slough] [2:Adherent Slough] [N/A:N/A] Exposed Structures: [1:Fat Layer (Subcutaneous Tissue) Exposed: Yes Fascia: No Tendon: No] [2:Fascia: No Fat Layer (Subcutaneous Tissue) Exposed: No Tendon: No] [N/A:N/A] Muscle: No Muscle: No Joint: No Joint: No Bone: No Bone: No Epithelialization: None None N/A Periwound Skin Texture: Excoriation: No Excoriation: No N/A Induration: No Induration: No Callus: No Callus:  No Crepitus: No Crepitus: No Rash: No Rash: No Scarring: No Scarring: No Periwound Skin Maceration: No Maceration: No N/A Moisture: Dry/Scaly: No Dry/Scaly: No Periwound Skin Color: Atrophie Blanche: No Atrophie Blanche: No N/A Cyanosis: No Cyanosis: No Ecchymosis: No Ecchymosis: No Erythema: No Erythema: No Hemosiderin Staining: No Hemosiderin Staining: No Mottled: No Mottled: No Pallor: No Pallor: No Rubor: No Rubor: No Tenderness on Yes Yes N/A Palpation: Wound Preparation: Ulcer Cleansing: Ulcer Cleansing: N/A Rinsed/Irrigated with Rinsed/Irrigated with Saline Saline Topical Anesthetic Topical Anesthetic Applied: Other: lidocaine Applied: Other: lidocaine 4% 4% Treatment Notes Electronic Signature(s) Signed: 10/04/2016 5:16:03 PM By: Montey Hora Entered By: Montey Hora on 10/04/2016 08:54:23 [name] ([id number]) -------------------------------------------------------------------------------- [place] Details Patient Name: [name] Date of Service: 10/04/2016 8:00 AM Medical Record Number: [id number] Patient Account Number: [id number] Date of Birth/Sex: 1946-03-24 (70 y.o. Male) Treating RN: Montey Hora Primary Care Cadience Bradfield: [name] Other Clinician: Referring Silas Muff: [name] Treating Ambrea Hegler/Extender: Melburn Hake,  HOYT Weeks in Treatment: 0 Active Inactive ` Abuse / Safety / Falls / Self Care Management Nursing Diagnoses: Impaired physical mobility Goals: Patient will remain injury free related to falls Date Initiated: 10/04/2016 Target Resolution Date: 12/03/2016 Goal Status: Active Interventions: Assess fall risk on admission and as needed Notes: ` Orientation to the Wound Care Program Nursing Diagnoses: Knowledge deficit related to the wound healing center program Goals: Patient/caregiver will verbalize understanding of the [place] Date Initiated:  10/04/2016 Target Resolution Date: 12/03/2016 Goal Status: Active Interventions: Provide education on orientation to the wound center Notes: ` Wound/Skin Impairment Nursing Diagnoses: Impaired tissue integrity EAGAN, SHIFFLETT ([id number]) Goals: Patient/caregiver will verbalize understanding of skin care regimen Date Initiated: 10/04/2016 Target Resolution Date: 12/03/2016 Goal Status: Active Ulcer/skin breakdown will have a volume reduction of 30% by week 4 Date Initiated: 10/04/2016 Target Resolution Date: 12/03/2016 Goal Status: Active Ulcer/skin breakdown will have a volume reduction of 50% by week 8 Date Initiated: 10/04/2016 Target Resolution Date: 12/03/2016 Goal Status: Active Ulcer/skin breakdown will have a volume reduction of 80% by week 12 Date Initiated: 10/04/2016 Target Resolution Date: 12/03/2016 Goal Status: Active Ulcer/skin breakdown will heal within 14 weeks Date Initiated: 10/04/2016 Target Resolution Date: 12/03/2016 Goal Status: Active Interventions: Assess patient/caregiver ability to obtain necessary supplies Assess patient/caregiver ability to perform ulcer/skin care regimen upon admission and as needed Assess ulceration(s) every visit Notes: Electronic Signature(s) Signed: 10/04/2016 5:16:03 PM By: Montey Hora Entered By: Montey Hora on 10/04/2016 08:54:02 [name] ([id number]) -------------------------------------------------------------------------------- Pain Assessment Details Patient Name: [name] Date of Service: 10/04/2016 8:00 AM Medical Record Number: [id number] Patient Account Number: [id number] Date of Birth/Sex: 18-Feb-1946 (70 y.o. Male) Treating RN: Montey Hora Primary Care Zailynn Brandel: [name] Other Clinician: Referring Meriam Chojnowski: [name] Treating Hermenia Fritcher/Extender: Melburn Hake, HOYT Weeks in Treatment: 0 Active Problems Location of Pain Severity and Description of Pain Patient Has Paino Yes Site  Locations Pain Location: Pain in Ulcers With Dressing Change: No Rate the pain. Current Pain Level: 0 Worst Pain Level: 2 Pain Management and Medication Current Pain Management: Notes Topical or injectable lidocaine is offered to patient for acute pain when surgical debridement is performed. If needed, Patient is instructed to use over the counter pain medication for the following 24-48 hours after debridement. Wound care MDs do not prescribed pain medications. Patient has chronic pain or uncontrolled pain. Patient has been instructed to make an appointment with their Primary Care Physician for pain management. Electronic Signature(s) Signed: 10/04/2016 5:16:03 PM By: Montey Hora Entered By: Montey Hora on 10/04/2016 08:14:19 [name] ([id number]) -------------------------------------------------------------------------------- Patient/Caregiver Education Details Patient Name: [name] Date of Service: 10/04/2016 8:00 AM Medical Record Number: [id number] Patient Account Number: [id number] Date of Birth/Gender: [birth year] (70 y.o. Male) Treating RN: Montey Hora Primary Care Physician: [name] Other Clinician: Referring Physician: [name] Treating Physician/Extender: Sharalyn Ink in Treatment: 0 Education Assessment Education Provided To: Patient and Caregiver Education Topics Provided Wound/Skin Impairment: Handouts: Other: wound care as ordered Methods: Demonstration, Explain/Verbal Responses: State content correctly Electronic Signature(s) Signed: 10/04/2016 5:16:03 PM By: Montey Hora Entered By: Montey Hora on 10/04/2016 08:55:12 [name] ([id number]) -------------------------------------------------------------------------------- Wound Assessment Details Patient Name: [name] Date of Service: 10/04/2016 8:00 AM Medical Record Number: [id number] Patient Account Number: [id number] Date of Birth/Sex:  1946/01/21 (70 y.o. Male) Treating RN: Montey Hora Primary Care Sean Macwilliams: [name] Other Clinician: Referring Bekka Qian: [name] Treating Brittnie Lewey/Extender: [name], HOYT Weeks in Treatment:  0 Wound Status Wound Number: 1 Primary Trauma, Other Etiology: Wound Location: Right Wrist - Posterior Wound Open Wounding Event: Trauma Status: Date Acquired: 09/20/2016 Comorbid Hypertension, Type II Diabetes, Gout, Weeks Of Treatment: 0 History: Osteoarthritis, Dementia, Neuropathy Clustered Wound: No Photos Photo Uploaded By: Montey Hora on 10/04/2016 10:48:18 Wound Measurements Length: (cm) 7 Width: (cm) 2.5 Depth: (cm) 0.3 Area: (cm) 13.744 Volume: (cm) 4.123 % Reduction in Area: 0% % Reduction in Volume: 0% Epithelialization: None Tunneling: No Undermining: No Wound Description Full Thickness With Exposed Classification: Support Structures Wound Margin: Flat and Intact Exudate Large Amount: Exudate Type: Serous Exudate Color: amber Foul Odor After Cleansing: No Slough/Fibrino Yes Wound Bed Granulation Amount: Small (1-33%) Exposed Structure Granulation Quality: Pink Fascia Exposed: No Necrotic Amount: Large (67-100%) Fat Layer (Subcutaneous Tissue) Exposed: Yes DACOTAH, CABELLO ([id number]) Necrotic Quality: Eschar, Adherent Slough Tendon Exposed: No Muscle Exposed: No Joint Exposed: No Bone Exposed: No Periwound Skin Texture Texture Color No Abnormalities Noted: No No Abnormalities Noted: No Callus: No Atrophie Blanche: No Crepitus: No Cyanosis: No Excoriation: No Ecchymosis: No Induration: No Erythema: No Rash: No Hemosiderin Staining: No Scarring: No Mottled: No Pallor: No Moisture Rubor: No No Abnormalities Noted: No Dry / Scaly: No Temperature / Pain Maceration: No Tenderness on Palpation: Yes Wound Preparation Ulcer Cleansing: Rinsed/Irrigated with Saline Topical Anesthetic Applied: Xylocaine Injectable 2%  plain/with epinephrine, total cc, Other: lidocaine 4%, Treatment Notes Wound #1 (Right, Posterior Wrist) 1. Cleansed with: Clean wound with Normal Saline 2. Anesthetic Topical Lidocaine 4% cream to wound bed prior to debridement 2% Lidocaine injectible without epinephrine prior to debridement 4. Dressing Applied: Santyl Ointment 5. Secondary Dressing Applied Gauze and Kerlix/Conform 7. Secured with Recruitment consultant) Signed: 10/04/2016 10:16:44 AM By: Montey Hora Entered By: Montey Hora on 10/04/2016 10:16:44 [name] ([id number]) -------------------------------------------------------------------------------- Wound Assessment Details Patient Name: [name] Date of Service: 10/04/2016 8:00 AM Medical Record Number: [id number] Patient Account Number: [id number] Date of Birth/Sex: 08/16/1945 (70 y.o. Male) Treating RN: Montey Hora Primary Care Kairyn Olmeda: [name] Other Clinician: Referring Ayeshia Coppin: [name] Treating Paytin Ramakrishnan/Extender: [name], HOYT Weeks in Treatment: 0 Wound Status Wound Number: 2 Primary Trauma, Other Etiology: Wound Location: Right Hand - 4th Digit Wound Open Wounding Event: Trauma Status: Date Acquired: 09/20/2016 Comorbid Hypertension, Type II Diabetes, Gout, Weeks Of Treatment: 0 History: Osteoarthritis, Dementia, Neuropathy Clustered Wound: No Photos Photo Uploaded By: Montey Hora on 10/04/2016 10:48:19 Wound Measurements Length: (cm) 2 Width: (cm) 1 Depth: (cm) 0.1 Area: (cm) 1.571 Volume: (cm) 0.157 % Reduction in Area: % Reduction in Volume: Epithelialization: None Tunneling: No Undermining: No Wound Description Classification: Partial Thickness Wound Margin: Flat and Intact Exudate Amount: Large Exudate Type: Serous Exudate Color: amber Foul Odor After Cleansing: No Slough/Fibrino Yes Wound Bed Granulation Amount: Large (67-100%) Exposed Structure Granulation Quality:  Red Fascia Exposed: No Necrotic Amount: Small (1-33%) Fat Layer (Subcutaneous Tissue) Exposed: No Necrotic Quality: Adherent Slough Tendon Exposed: No JAJA, SWITALSKI E. ([id number]) Muscle Exposed: No Joint Exposed: No Bone Exposed: No Periwound Skin Texture Texture Color No Abnormalities Noted: No No Abnormalities Noted: No Callus: No Atrophie Blanche: No Crepitus: No Cyanosis: No Excoriation: No Ecchymosis: No Induration: No Erythema: No Rash: No Hemosiderin Staining: No Scarring: No Mottled: No Pallor: No Moisture Rubor: No No Abnormalities Noted: No Dry / Scaly: No Temperature / Pain Maceration: No Tenderness on Palpation: Yes Wound Preparation Ulcer Cleansing: Rinsed/Irrigated with Saline Topical Anesthetic Applied: Other: lidocaine 4%, Treatment Notes Wound #2 (Right Hand -  4th Digit) 1. Cleansed with: Clean wound with Normal Saline 4. Dressing Applied: Other dressing (specify in notes) Notes triple antibiotic ointment and coverlet Electronic Signature(s) Signed: 10/04/2016 5:16:03 PM By: Montey Hora Entered By: Montey Hora on 10/04/2016 08:34:47 [name] ([id number]) -------------------------------------------------------------------------------- [place] Details Patient Name: [name] Date of Service: 10/04/2016 8:00 AM Medical Record Number: [id number] Patient Account Number: [id number] Date of Birth/Sex: Feb 09, 1946 (70 y.o. Male) Treating RN: Montey Hora Primary Care Brandonn Capelli: [name] Other Clinician: Referring Ilona Colley: [name] Treating Kathren Scearce/Extender: Melburn Hake, HOYT Weeks in Treatment: 0 Vital Signs Time Taken: 08:15 Temperature (F): 97.6 Height (in): 70 Pulse (bpm): 81 Source: Measured Respiratory Rate (breaths/min): 18 Weight (lbs): 241 Blood Pressure (mmHg): 124/58 Source: Measured Reference Range: 80 - 120 mg / dl Body Mass Index (BMI): 34.6 Electronic Signature(s) Signed: 10/04/2016  5:16:03 PM By: Montey Hora Entered By: Montey Hora on 10/04/2016 08:17:00

## 2016-10-05 NOTE — Progress Notes (Signed)
DONTARIOUS, SCHAUM ([id number]) Visit Report for 10/04/2016 Chief Complaint Document Details Patient Name: [name], [name] Date of Service: 10/04/2016 8:00 AM Medical Record Number: [id number] Patient Account Number: [id number] Date of Birth/Sex: 09-20-1945 (71 y.o. Male) Treating RN: Montey Hora Primary Care Provider: [name] Other Clinician: Referring Provider: [name] Treating Provider/Extender: Melburn Hake, HOYT Weeks in Treatment: 0 Information Obtained from: Patient Chief Complaint Right forearm and hand traumatic ulcer Electronic Signature(s) Signed: 10/04/2016 5:09:57 PM By: Worthy Keeler [name] Entered By: Worthy Keeler on 10/04/2016 13:34:39 [name] ([id number]) -------------------------------------------------------------------------------- Debridement Details Patient Name: [name] Date of Service: 10/04/2016 8:00 AM Medical Record Number: [id number] Patient Account Number: [id number] Date of Birth/Sex: 1945-08-11 (70 y.o. Male) Treating RN: Montey Hora Primary Care Provider: [name] Other Clinician: Referring Provider: [name] Treating Provider/Extender: Melburn Hake, HOYT Weeks in Treatment: 0 Debridement Performed for Wound #1 Right,Posterior Wrist Assessment: Performed By: Physician [name], HOYT E., [name] Debridement: Debridement Pre-procedure Verification/Time Out Yes - 09:04 Taken: Start Time: 09:04 Pain Control: Lidocaine Injectable : 2 Level: Skin/Subcutaneous Tissue Total Area Debrided (L x 7 (cm) x 2.5 (cm) = 17.5 (cm) W): Tissue and other Viable, Non-Viable, Fibrin/Slough, Skin, Subcutaneous material debrided: Instrument: Blade, Forceps Bleeding: Moderate Hemostasis Achieved: Pressure End Time: 09:09 Procedural Pain: 0 Post Procedural Pain: 0 Response to Treatment: Procedure was tolerated well Post Debridement Measurements of Total Wound Length: (cm) 5.3 Width: (cm) 2.5 Depth: (cm)  0.4 Volume: (cm) 4.163 Character of Wound/Ulcer Post Improved Debridement: Post Procedure Diagnosis Same as Pre-procedure Electronic Signature(s) Signed: 10/04/2016 5:09:57 PM By: Worthy Keeler [name] Signed: 10/04/2016 5:16:03 PM By: Montey Hora Entered By: Montey Hora on 10/04/2016 09:15:57 [name] ([id number]) -------------------------------------------------------------------------------- HPI Details Patient Name: [name] Date of Service: 10/04/2016 8:00 AM Medical Record Number: [id number] Patient Account Number: [id number] Date of Birth/Sex: 25-Dec-1945 (70 y.o. Male) Treating RN: Montey Hora Primary Care Provider: [name] Other Clinician: Referring Provider: [name] Treating Provider/Extender: Melburn Hake, HOYT Weeks in Treatment: 0 History of Present Illness Location: Right forearm and hand Quality: Patient states that he has a mild sore sensation Severity: 1/10 Duration: For approximately 2 weeks prior to initial evaluation in the wound center Timing: With cleansing of the wound or pressure in general Context: Patientos wound was initiated by him falling into glass causing multiple lacerations though the hand laceration is the worst. Modifying Factors: Patient has recently been on antibiotics for this wound and was soaking it in peroxide for a time. Most recently antibiotic cream has been used Associated Signs and Symptoms: Uncontrolled DM Type II HPI Description: 10/04/16 on evaluation today patient presents for initial evaluation concerning a laceration of the right wrist and hand on the bowler aspect. He tells me that he fell into glass following a medication change in regard to his diabetes. He initially went to [facility] where she was sutured and subsequently his daughter who is a Marine scientist removed the teachers at the appropriate time. Unfortunately the wound has done poorly and dehissed which has caused him some trouble  including infection. He has been on antibiotics both topically and orally though the wound continues to show signs of necrotic tissue according to notes which is what he was sent to [place] for evaluation concerning. He does have diabetes and unfortunately this is severely uncontrolled. He does see an endocrinologist and is working on this. Electronic Signature(s) Signed: 10/04/2016 5:09:57 PM By: Worthy Keeler [name] Entered By: Melburn Hake,  Hoyt on 10/04/2016 13:46:41 KAMIR, SELOVER ([id number]) -------------------------------------------------------------------------------- Physical Exam Details Patient Name: [name] Date of Service: 10/04/2016 8:00 AM Medical Record Number: [id number] Patient Account Number: [id number] Date of Birth/Sex: [birth year] (71 y.o. Male) Treating RN: Montey Hora Primary Care Provider: [name] Other Clinician: Referring Provider: [name] Treating Provider/Extender: [name], HOYT Weeks in Treatment: 0 Constitutional sitting or standing blood pressure is within target range for patient.[name] Kitchen respirations regular, non-labored and within target range for patient.[name] Kitchen temperature within target range for patient.. Obese and well-hydrated in no acute distress. Eyes conjunctiva clear no eyelid edema noted. pupils equal round and reactive to light and accommodation. Ears, Nose, Mouth, and Throat no gross abnormality of ear auricles or external auditory canals. normal hearing noted during conversation. mucus membranes moist. Respiratory normal breathing without difficulty. clear to auscultation bilaterally. Cardiovascular regular rate and rhythm with normal S1, S2. no clubbing, cyanosis, significant edema, <3 sec cap refill. Gastrointestinal (GI) soft, non-tender, non-distended, +BS. no ventral hernia noted. Musculoskeletal normal gait and posture. no significant deformity or arthritic changes, no loss or range of motion,  no clubbing. Psychiatric this patient is able to make decisions and demonstrates good insight into disease process. Alert and Oriented x 3. pleasant and cooperative. Notes Patient's wound over the lower aspect of his right hand reveals the necrotic flap noted on the medial aspect of the palm. This does appear to be getting worse according to records that I did review today. Due to the nature and quality of the skin flap and the fact that it was necrotic debridement was required and I did excise the skin flap away. I did explain to the patient as well that this wound was going to take a little longer to heal and that it would have to fill in from the bottom up. He voiced understanding. His grandson was seen in the office with him today as well. He tolerated the debridement very well after I nontender 2% lidocaine. Electronic Signature(s) Signed: 10/04/2016 5:09:57 PM By: Worthy Keeler [name] Entered By: Worthy Keeler on 10/04/2016 13:49:16 [name] ([id number]) -------------------------------------------------------------------------------- Physician Orders Details Patient Name: [name] Date of Service: 10/04/2016 8:00 AM Medical Record Number: [id number] Patient Account Number: [id number] Date of Birth/Sex: 07-Apr-1946 (70 y.o. Male) Treating RN: Montey Hora Primary Care Provider: [name] Other Clinician: Referring Provider: [name] Treating Provider/Extender: Melburn Hake, HOYT Weeks in Treatment: 0 Verbal / Phone Orders: No Diagnosis Coding Wound Cleansing Wound #1 Right,Posterior Wrist o Clean wound with Normal Saline. o May Shower, gently pat wound dry prior to applying new dressing. Wound #2 Right Hand - 4th Digit o Clean wound with Normal Saline. o May Shower, gently pat wound dry prior to applying new dressing. Anesthetic Wound #1 Right,Posterior Wrist o Topical Lidocaine 4% cream applied to wound bed prior to debridement o  Injected 2% Lidocaine without epinephrine prior to debridement Wound #2 Right Hand - 4th Digit o Topical Lidocaine 4% cream applied to wound bed prior to debridement o Injected 2% Lidocaine without epinephrine prior to debridement Primary Wound Dressing Wound #1 Right,Posterior Wrist o Santyl Ointment Wound #2 Right Hand - 4th Digit o Other: - triple antibiotic ointment Secondary Dressing Wound #1 Right,Posterior Wrist o Gauze and Kerlix/Conform - secure with tape Wound #2 Right Hand - 4th Digit o Other - coverlet or bandaid Dressing Change Frequency Wound #1 Right,Posterior Wrist o Change dressing every day. KEIONTE, SWICEGOOD ([id number]) Wound #2 Right Hand - 4th Digit   o Change dressing every day. Follow-up Appointments Wound #1 Right,Posterior Wrist o Return Appointment in 1 week. Wound #2 Right Hand - 4th Digit o Return Appointment in 1 week. Additional Orders / Instructions Wound #1 Right,Posterior Wrist o Increase protein intake. o Other: - Please try to keep blood sugars below 180. Please add vitamin A, vitamin C and zinc supplements to your diet Wound #2 Right Hand - 4th Digit o Increase protein intake. o Other: - Please try to keep blood sugars below 180. Please add vitamin A, vitamin C and zinc supplements to your diet Medications-please add to medication list. Wound #1 Right,Posterior Wrist o Santyl Enzymatic Ointment Wound #2 Right Hand - 4th Digit o Santyl Enzymatic Ointment Notes I did go ahead and initiate treatment with Santyl for the next week. Hopefully he will do very well with this the wound did not appear to show any structure post debridement which is good news. He also did not appear to have any evidence of infection at the moment. We will see how things do over the next week and if anything worsens in the interim he will contact our office for additional recommendations. Otherwise this will be dressed and cared for per  the above dressing orders. Electronic Signature(s) Signed: 10/04/2016 5:09:57 PM By: Worthy Keeler [name] Entered By: Worthy Keeler on 10/04/2016 13:50:21 [name] ([id number]) -------------------------------------------------------------------------------- Problem List Details Patient Name: [name] Date of Service: 10/04/2016 8:00 AM Medical Record Number: [id number] Patient Account Number: [id number] Date of Birth/Sex: [birth year] (71 y.o. 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 Male) Treating RN: Montey Hora Primary Care Provider: [name] Other Clinician: Referring Provider: [name] Treating Provider/Extender: Melburn Hake, HOYT Weeks in Treatment: 0 Subjective Chief Complaint Information obtained from Patient Right forearm and hand traumatic ulcer History of Present Illness (HPI) The following HPI elements were documented for the patient's wound: Location: Right forearm and hand Quality: Patient states  that he has a mild sore sensation Severity: 1/10 Duration: For approximately 2 weeks prior to initial evaluation in the wound center Timing: With cleansing of the wound or pressure in general Context: Patient s wound was initiated by him falling into glass causing multiple lacerations though the hand laceration is the worst. Modifying Factors: Patient has recently been on antibiotics for this wound and was soaking it in peroxide for a time. Most recently antibiotic cream has been used Associated Signs and Symptoms: Uncontrolled DM Type II 10/04/16 on evaluation today patient presents for initial evaluation concerning a laceration of the right wrist and hand on the bowler aspect. He tells me that he fell into glass following a medication change in regard to his diabetes. He initially went to [facility] where she was sutured and subsequently his daughter who is a  nurse removed the teachers at the appropriate time. Unfortunately the wound has done poorly and dehissed which has caused him some trouble including infection. He has been on antibiotics both topically and orally though the wound continues to show signs of necrotic tissue according to notes which is what he was sent to [place] for evaluation concerning. He does have diabetes and unfortunately this is severely uncontrolled. He does see an endocrinologist and is working on this. Wound History Patient presents with 1 open wound that has been present for approximately 2 weeks. Laboratory tests have been performed in the last month. Patient reportedly has not tested positive for an antibiotic resistant organism. Patient reportedly has not tested positive for osteomyelitis. Patient reportedly has not had testing performed to evaluate circulation in the legs. Patient History Information obtained from Patient. Allergies tetracycline GRABIEL, SCHMUTZ ([id number]) Social History Former smoker, Marital Status - Married, Alcohol Use - Never,  Drug Use - No History, Caffeine Use - Daily. Medical History Cardiovascular Patient has history of Hypertension Denies history of Angina, Arrhythmia, Congestive Heart Failure, Coronary Artery Disease, Deep Vein Thrombosis, Hypotension, Myocardial Infarction, Peripheral Arterial Disease, Peripheral Venous Disease, Phlebitis, Vasculitis Gastrointestinal Denies history of Cirrhosis , Colitis, Crohn s, Hepatitis A, Hepatitis B, Hepatitis C Endocrine Patient has history of Type II Diabetes Genitourinary Denies history of End Stage Renal Disease Immunological Denies history of Lupus Erythematosus, Raynaud s, Scleroderma Musculoskeletal Patient has history of Gout - history, Osteoarthritis Denies history of Rheumatoid Arthritis, Osteomyelitis Neurologic Patient has history of Dementia, Neuropathy Denies history of Quadriplegia, Paraplegia, Seizure Disorder Oncologic Denies history of Received Chemotherapy, Received Radiation Patient is treated with Insulin. Blood sugar is tested. 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 Eyes Complains or has symptoms of Glasses / Contacts - glasses. Denies complaints or symptoms of Dry Eyes, Vision Changes. Ear/Nose/Mouth/Throat The patient has no complaints or symptoms. Hematologic/Lymphatic The patient has no complaints or symptoms. Respiratory The patient has no complaints or symptoms. Cardiovascular The patient has no complaints or symptoms. Gastrointestinal ISIAC, BREIGHNER ([id number]) The patient has no complaints or symptoms. Endocrine The patient has no complaints or symptoms. Genitourinary The patient has no complaints or symptoms. Immunological The patient has no complaints or symptoms. Integumentary (Skin) The patient has no complaints  or symptoms. Musculoskeletal The patient has no complaints or symptoms. Neurologic Denies complaints or symptoms of Numbness/parasthesias, Focal/Weakness. Oncologic The patient has no complaints or symptoms. Psychiatric The patient has no complaints or symptoms. Objective Constitutional sitting or standing blood pressure is within target range for patient.[name] Kitchen respirations regular, non-labored and within target range for patient.[name] Kitchen temperature within target range for patient.. Obese and well-hydrated in no acute distress. Vitals Time Taken: 8:15 AM, Height: 70 in, Source: Measured, Weight: 241 lbs, Source: Measured, BMI: 34.6, Temperature: 97.6 F, Pulse: 81 bpm, Respiratory Rate: 18 breaths/min, Blood Pressure: 124/58 mmHg. Eyes conjunctiva clear no eyelid edema noted. pupils equal round and reactive to light and accommodation. Ears, Nose, Mouth, and Throat no gross abnormality of ear auricles or external auditory canals. normal hearing noted during conversation. mucus membranes moist. Respiratory normal breathing without difficulty. clear to auscultation bilaterally. Cardiovascular regular rate and rhythm with normal S1, S2. no clubbing, cyanosis, significant edema, Fulbright, Dequane E. ([id number]) Gastrointestinal (GI) soft, non-tender, non-distended, +BS. no ventral hernia noted. Musculoskeletal normal gait and posture. no significant  deformity or arthritic changes, no loss or range of motion, no clubbing. Psychiatric this patient is able to make decisions and demonstrates good insight into disease process. Alert and Oriented x 3. pleasant and cooperative. General Notes: Patient's wound over the lower aspect of his right hand reveals the necrotic flap noted on the medial aspect of the palm. This does appear to be getting worse according to records that I did review today. Due to the nature and quality of the skin flap and the fact that it was necrotic debridement was required and  I did excise the skin flap away. I did explain to the patient as well that this wound was going to take a little longer to heal and that it would have to fill in from the bottom up. He voiced understanding. His grandson was seen in the office with him today as well. He tolerated the debridement very well after I nontender 2% lidocaine. Integumentary (Hair, Skin) Wound #1 status is Open. Original cause of wound was Trauma. The wound is located on the Right,Posterior Wrist. The wound measures 7cm length x 2.5cm width x 0.3cm depth; 13.744cm^2 area and 4.123cm^3 volume. There is Fat Layer (Subcutaneous Tissue) Exposed exposed. There is no tunneling or undermining noted. There is a large amount of serous drainage noted. The wound margin is flat and intact. There is small (1-33%) pink granulation within the wound bed. There is a large (67-100%) amount of necrotic tissue within the wound bed including Eschar and Adherent Slough. The periwound skin appearance did not exhibit: Callus, Crepitus, Excoriation, Induration, Rash, Scarring, Dry/Scaly, Maceration, Atrophie Blanche, Cyanosis, Ecchymosis, Hemosiderin Staining, Mottled, Pallor, Rubor, Erythema. The periwound has tenderness on palpation. Wound #2 status is Open. Original cause of wound was Trauma. The wound is located on the Right Hand - 4th Digit. The wound measures 2cm length x 1cm width x 0.1cm depth; 1.571cm^2 area and 0.157cm^3 volume. There is no tunneling or undermining noted. There is a large amount of serous drainage noted. The wound margin is flat and intact. There is large (67-100%) red granulation within the wound bed. There is a small (1-33%) amount of necrotic tissue within the wound bed including Adherent Slough. The periwound skin appearance did not exhibit: Callus, Crepitus, Excoriation, Induration, Rash, Scarring, Dry/Scaly, Maceration, Atrophie Blanche, Cyanosis, Ecchymosis, Hemosiderin Staining, Mottled, Pallor,  Rubor, Erythema. The periwound has tenderness on palpation. Assessment Active Problems ICD-10 S41.101A - Unspecified open wound of right upper arm, initial encounter L98.492 - Non-pressure chronic ulcer of skin of other sites with fat layer exposed JAHREE, DERMODY ([id number]) E11.622 - Type 2 diabetes mellitus with other skin ulcer Procedures Wound #1 Pre-procedure diagnosis of Wound #1 is a Trauma, Other located on the Right,Posterior Wrist . There was a Skin/Subcutaneous Tissue Debridement ([id number]) debridement with total area of 17.5 sq cm performed by [name], HOYT E., [name]. with the following instrument(s): Blade and Forceps to remove Viable and Non-Viable tissue/material including Fibrin/Slough, Skin, and Subcutaneous after achieving pain control using Lidocaine Injectable: 2%. A time out was conducted at 09:04, prior to the start of the procedure. A Moderate amount of bleeding was controlled with Pressure. The procedure was tolerated well with a pain level of 0 throughout and a pain level of 0 following the procedure. Post Debridement Measurements: 5.3cm length x 2.5cm width x 0.4cm depth; 4.163cm^3 volume. Character of Wound/Ulcer Post Debridement is improved. Post procedure Diagnosis Wound #1: Same as Pre-Procedure Plan Wound Cleansing: Wound #1 Right,Posterior Wrist: Clean wound with Normal  Saline. May Shower, gently pat wound dry prior to applying new dressing. Wound #2 Right Hand - 4th Digit: Clean wound with Normal Saline. May Shower, gently pat wound dry prior to applying new dressing. Anesthetic: Wound #1 Right,Posterior Wrist: Topical Lidocaine 4% cream applied to wound bed prior to debridement Injected 2% Lidocaine without epinephrine prior to debridement Wound #2 Right Hand - 4th Digit: Topical Lidocaine 4% cream applied to wound bed prior to debridement Injected 2% Lidocaine without epinephrine prior to debridement Primary Wound Dressing: Wound #1  Right,Posterior Wrist: Santyl Ointment Wound #2 Right Hand - 4th Digit: Other: - triple antibiotic ointment Secondary Dressing: Wound #1 Right,Posterior Wrist: Gauze and Kerlix/Conform - secure with tape RODOLPHE, EDMONSTON E. ([id number]) Wound #2 Right Hand - 4th Digit: Other - coverlet or bandaid Dressing Change Frequency: Wound #1 Right,Posterior Wrist: Change dressing every day. Wound #2 Right Hand - 4th Digit: Change dressing every day. Follow-up Appointments: Wound #1 Right,Posterior Wrist: Return Appointment in 1 week. Wound #2 Right Hand - 4th Digit: Return Appointment in 1 week. Additional Orders / Instructions: Wound #1 Right,Posterior Wrist: Increase protein intake. Other: - Please try to keep blood sugars below 180. Please add vitamin A, vitamin C and zinc supplements to your diet Wound #2 Right Hand - 4th Digit: Increase protein intake. Other: - Please try to keep blood sugars below 180. Please add vitamin A, vitamin C and zinc supplements to your diet Medications-please add to medication list.: Wound #1 Right,Posterior Wrist: Santyl Enzymatic Ointment Wound #2 Right Hand - 4th Digit: Santyl Enzymatic Ointment General Notes: I did go ahead and initiate treatment with Santyl for the next week. Hopefully he will do very well with this the wound did not appear to show any structure post debridement which is good news. He also did not appear to have any evidence of infection at the moment. We will see how things do over the next week and if anything worsens in the interim he will contact our office for additional recommendations. Otherwise this will be dressed and cared for per the above dressing orders. Electronic Signature(s) Signed: 10/04/2016 5:09:57 PM By: Worthy Keeler [name] Entered By: Worthy Keeler on 10/04/2016 13:50:38 [name] ([id number]) -------------------------------------------------------------------------------- ROS/PFSH Details Patient  Name: [name] Date of Service: 10/04/2016 8:00 AM Medical Record Number: [id number] Patient Account Number: [id number] Date of Birth/Sex: February 24, 1946 (70 y.o. Male) Treating RN: Montey Hora Primary Care Provider: [name] Other Clinician: Referring Provider: [name] Treating Provider/Extender: Melburn Hake, HOYT Weeks in Treatment: 0 Information Obtained From Patient Wound History Do you currently have one or more open woundso Yes How many open wounds do you currently haveo 1 Approximately how long have you had your woundso 2 weeks Has your wound(s) ever healed and then re-openedo No Have you had any lab work done in the past montho Yes Who ordered the lab work doneo A1C 10.3 on 08/16/16 Have you tested positive for an antibiotic resistant organism (MRSA, VRE)o No Have you tested positive for osteomyelitis (bone infection)o No Have you had any tests for circulation on your legso No Constitutional Symptoms (General Health) Complaints and Symptoms: No Complaints or Symptoms Complaints and Symptoms: Negative for: Fatigue; Fever; Chills; Marked Weight Change Eyes Complaints and Symptoms: Positive for: Glasses / Contacts - glasses Negative for: Dry Eyes; Vision Changes Ear/Nose/Mouth/Throat Complaints and Symptoms: No Complaints or Symptoms Complaints and Symptoms: Negative for: Difficult clearing ears; Sinusitis Genitourinary Complaints and Symptoms: No Complaints or Symptoms Complaints and Symptoms:  Negative for: Kidney failure/ Dialysis; Incontinence/dribbling ELIJA, MCCAMISH ([id number]) Medical History: Negative for: End Stage Renal Disease Past Medical History Notes: "weak kidneys" - is a patient at [facility] Complaints and Symptoms: No Complaints or Symptoms Complaints and Symptoms: Negative for: Hives; Itching Medical History: Negative for: Lupus Erythematosus; Raynaudos; Scleroderma Neurologic Complaints and  Symptoms: Negative for: Numbness/parasthesias; Focal/Weakness Medical History: Positive for: Dementia; Neuropathy Negative for: Quadriplegia; Paraplegia; Seizure Disorder Hematologic/Lymphatic Complaints and Symptoms: No Complaints or Symptoms Respiratory Complaints and Symptoms: No Complaints or Symptoms Cardiovascular Complaints and Symptoms: No Complaints or Symptoms Medical History: Positive for: Hypertension Negative for: Angina; Arrhythmia; Congestive Heart Failure; Coronary Artery Disease; Deep Vein Thrombosis; Hypotension; Myocardial Infarction; Peripheral Arterial Disease; Peripheral Venous Disease; Phlebitis; Vasculitis Past Medical History Notes: CVA in 2015 Gastrointestinal RAQUAN, IANNONE. ([id number]) Complaints and Symptoms: No Complaints or Symptoms Medical History: Negative for: Cirrhosis ; Colitis; Crohnos; Hepatitis A; Hepatitis B; Hepatitis C Endocrine Complaints and Symptoms: No Complaints or Symptoms Medical History: Positive for: Type II Diabetes Treated with: Insulin Blood sugar tested every day: Yes Tested : TID Integumentary (Skin) Complaints and Symptoms: No Complaints or Symptoms Musculoskeletal Complaints and Symptoms: No Complaints or Symptoms Medical History: Positive for: Gout - history; Osteoarthritis Negative for: Rheumatoid Arthritis; Osteomyelitis Oncologic Complaints and Symptoms: No Complaints or Symptoms Medical History: Negative for: Received Chemotherapy; Received Radiation Psychiatric Complaints and Symptoms: No Complaints or Symptoms Immunizations Pneumococcal Vaccine: Received Pneumococcal Vaccination: Yes Immunization Notes: up to date Family and Social History LEMMIE, VANLANEN ([id number]) Former smoker; Marital Status - Married; Alcohol Use: Never; Drug Use: No History; Caffeine Use: Daily; Financial Concerns: No; Food, Clothing or Shelter Needs: No; Support System Lacking: No; Transportation Concerns: No;  Advanced Directives: No; Patient does not want information on Advanced Directives Electronic Signature(s) Signed: 10/04/2016 5:09:57 PM By: Worthy Keeler [name] Signed: 10/04/2016 5:16:03 PM By: Montey Hora Entered By: Montey Hora on 10/04/2016 08:26:17 [name] ([id number]) -------------------------------------------------------------------------------- [place] Details Patient Name: [name] Date of Service: 10/04/2016 Medical Record Number: [id number] Patient Account Number: [id number] Date of Birth/Sex: [birth year] (71 y.o. Male) Treating RN: Montey Hora Primary Care Provider: [name] Other Clinician: Referring Provider: [name] Treating Provider/Extender: Melburn Hake, HOYT Weeks in Treatment: 0 Diagnosis Coding ICD-10 Codes Code Description S41.101A Unspecified open wound of right upper arm, initial encounter L98.492 Non-pressure chronic ulcer of skin of other sites with fat layer exposed E11.622 Type 2 diabetes mellitus with other skin ulcer Facility Procedures CPT4 Code Description: 76283151 99213 - WOUND CARE VISIT-LEV 3 EST PT Modifier: Quantity: 1 CPT4 Code Description: 76160737 11042 - DEB SUBQ TISSUE 20 SQ CM/< ICD-10 Description Diagnosis L98.492 Non-pressure chronic ulcer of skin of other sites wi S41.101A Unspecified open wound of right upper arm, initial e Modifier: th fat layer ncounter Quantity: 1 exposed Physician Procedures CPT4 Code Description: [id number] [place] PHYS LEVEL 3 o NEW PT ICD-10 Description Diagnosis S41.101A Unspecified open wound of right upper arm, initial e L98.492 Non-pressure chronic ulcer of skin of other sites wi E11.622 Type 2 diabetes mellitus with other  skin ulcer Modifier: 25 ncounter th fat layer Quantity: 1 exposed CPT4 Code Description: [id number] 35009 - WC PHYS SUBQ TISS 20 SQ CM ICD-10 Description Diagnosis L98.492 Non-pressure chronic ulcer of skin of other sites wi S41.101A Unspecified open wound of  right upper arm, initial e Modifier: th fat layer ncounter Quantity: 1 exposed Electronic Signature(s) Signed: 10/04/2016 5:09:57 PM By: Waynard Reeds, [place]. ([id number]) Entered  By: Worthy Keeler on 10/04/2016 15:55:49

## 2016-10-08 ENCOUNTER — Ambulatory Visit (INDEPENDENT_AMBULATORY_CARE_PROVIDER_SITE_OTHER): Payer: Medicare Other | Admitting: Family Medicine

## 2016-10-08 ENCOUNTER — Encounter: Payer: Self-pay | Admitting: Family Medicine

## 2016-10-08 DIAGNOSIS — R2681 Unsteadiness on feet: Secondary | ICD-10-CM

## 2016-10-08 NOTE — Progress Notes (Signed)
Fell 2 weeks ago, cut his R hand when his hand went through a glass case.  Has fallen mult times.  Using a cane to walk now.  Last fall was about 2 weeks ago.    He is off concentrated insulin and is back on novolin N 30 units BID and R 30 units BID, total of 120 units in a day.  Sugar has been controlled in the meantime, per patient report.  He feels better on that regimen.  He is more unsteady during the day.  Taking gabapentin 2 tabs in the AM, 1 in the PM.  He can still feel the floor well when walking.  He doesn't feel presyncopal on standing.    Neck is sore, but not stiff.    PMH and SH reviewed  ROS: Per HPI unless specifically indicated in ROS section   Meds, vitals, and allergies reviewed.   GEN: nad, alert and oriented HEENT: mucous membranes moist NECK: supple w/o LA but paraspinal muscle tenderness noted on the right side. No midline pain. CV: rrr.  PULM: ctab, no inc wob ABD: soft, +bs EXT: no edema R hand bandaged.  Sutures are out per patient report.  Bandage not removed.   CN 2-12 wnl B, S/S/DTR wnl B

## 2016-10-08 NOTE — Patient Instructions (Signed)
Cut the gabapentin back to 1 pill twice a day and see if you feel more steady on your feet with that.  Don't change your meds otherwise.  Please update Dr. Lorelei Pont early next week.  Take care.  Glad to see you.

## 2016-10-10 DIAGNOSIS — R2681 Unsteadiness on feet: Secondary | ICD-10-CM | POA: Insufficient documentation

## 2016-10-10 NOTE — Assessment & Plan Note (Signed)
Likely multifactorial. He is using the cane. Fall cautions discussed with patient. He has diabetes but feels better with the combination of N and R insulin.  Per report is sugar was controlled with that regimen. Unclear of gabapentin is making him less steady during the day. He had been taking a higher dose in the morning, cut back his morning dose. Take 1 pill twice a day. I want him to update his PCP in a few days. He agrees with plan. No acute neurologic findings on exam. Discussed with him about stretching and heat for muscle spasms in his neck.

## 2016-10-11 ENCOUNTER — Encounter: Payer: Medicare Other | Attending: Surgery | Admitting: Surgery

## 2016-10-11 DIAGNOSIS — S41101A Unspecified open wound of right upper arm, initial encounter: Secondary | ICD-10-CM | POA: Diagnosis not present

## 2016-10-11 DIAGNOSIS — M109 Gout, unspecified: Secondary | ICD-10-CM | POA: Diagnosis not present

## 2016-10-11 DIAGNOSIS — Z794 Long term (current) use of insulin: Secondary | ICD-10-CM | POA: Insufficient documentation

## 2016-10-11 DIAGNOSIS — Z87891 Personal history of nicotine dependence: Secondary | ICD-10-CM | POA: Insufficient documentation

## 2016-10-11 DIAGNOSIS — M1990 Unspecified osteoarthritis, unspecified site: Secondary | ICD-10-CM | POA: Diagnosis not present

## 2016-10-11 DIAGNOSIS — F0390 Unspecified dementia without behavioral disturbance: Secondary | ICD-10-CM | POA: Diagnosis not present

## 2016-10-11 DIAGNOSIS — E1140 Type 2 diabetes mellitus with diabetic neuropathy, unspecified: Secondary | ICD-10-CM | POA: Diagnosis not present

## 2016-10-11 DIAGNOSIS — Z8673 Personal history of transient ischemic attack (TIA), and cerebral infarction without residual deficits: Secondary | ICD-10-CM | POA: Insufficient documentation

## 2016-10-11 DIAGNOSIS — I10 Essential (primary) hypertension: Secondary | ICD-10-CM | POA: Diagnosis not present

## 2016-10-11 DIAGNOSIS — W19XXXA Unspecified fall, initial encounter: Secondary | ICD-10-CM | POA: Insufficient documentation

## 2016-10-11 DIAGNOSIS — E11622 Type 2 diabetes mellitus with other skin ulcer: Secondary | ICD-10-CM | POA: Insufficient documentation

## 2016-10-11 DIAGNOSIS — L98492 Non-pressure chronic ulcer of skin of other sites with fat layer exposed: Secondary | ICD-10-CM | POA: Insufficient documentation

## 2016-10-11 DIAGNOSIS — S61511A Laceration without foreign body of right wrist, initial encounter: Secondary | ICD-10-CM | POA: Diagnosis not present

## 2016-10-12 NOTE — Progress Notes (Addendum)
[name], [name] ([id number]) Visit Report for 10/11/2016 Chief Complaint Document Details Patient Name: [name], [name] Date of Service: 10/11/2016 2:30 PM Medical Record Number: [id number] Patient Account Number: [id number] Date of Birth/Sex: 1945/07/07 (71 y.o. Male) Treating RN: Montey Hora Primary Care Provider: [name] Other Clinician: Referring Provider: [name] Treating Provider/Extender: Frann Rider in Treatment: 1 Information Obtained from: Patient Chief Complaint Right forearm and hand traumatic ulcer Electronic Signature(s) Signed: 10/11/2016 3:35:41 PM By: Christin Fudge MD, FACS Entered By: Christin Fudge on 10/11/2016 15:35:41 [name] ([id number]) -------------------------------------------------------------------------------- Debridement Details Patient Name: [name] Date of Service: 10/11/2016 2:30 PM Medical Record Number: [id number] Patient Account Number: [id number] Date of Birth/Sex: Jul 30, 1945 (70 y.o. Male) Treating RN: Montey Hora Primary Care Provider: [name] Other Clinician: Referring Provider: [name] Treating Provider/Extender: Frann Rider in Treatment: 1 Debridement Performed for Wound #1 Right,Posterior Wrist Assessment: Performed By: Physician Christin Fudge, MD Debridement: Debridement Pre-procedure Verification/Time Out Yes - 15:10 Taken: Start Time: 15:10 Pain Control: Lidocaine 4% Topical Solution Level: Skin/Subcutaneous Tissue Total Area Debrided (L x 5.2 (cm) x 1.7 (cm) = 8.84 (cm) W): Tissue and other Viable, Non-Viable, Eschar, Fibrin/Slough, Subcutaneous material debrided: Instrument: Curette Bleeding: Minimum Hemostasis Achieved: Pressure End Time: 15:12 Procedural Pain: 0 Post Procedural Pain: 0 Response to Treatment: Procedure was tolerated well Post Debridement Measurements of Total Wound Length: (cm) 5.2 Width: (cm) 1.7 Depth: (cm) 0.4 Volume:  (cm) 2.777 Character of Wound/Ulcer Post Improved Debridement: Post Procedure Diagnosis Same as Pre-procedure Electronic Signature(s) Signed: 10/11/2016 3:35:32 PM By: Christin Fudge MD, FACS Signed: 10/11/2016 4:56:49 PM By: Montey Hora Entered By: Christin Fudge on 10/11/2016 15:35:32 [name] ([id number]) -------------------------------------------------------------------------------- HPI Details Patient Name: [name] Date of Service: 10/11/2016 2:30 PM Medical Record Number: [id number] Patient Account Number: [id number] Date of Birth/Sex: 1946-01-11 (71 y.o. Male) Treating RN: Montey Hora Primary Care Provider: [name] Other Clinician: Referring Provider: [name] Treating Provider/Extender: Frann Rider in Treatment: 1 History of Present Illness Location: Right forearm and hand Quality: Patient states that he has a mild sore sensation Severity: 1/10 Duration: For approximately 2 weeks prior to initial evaluation in the wound center Timing: With cleansing of the wound or pressure in general Context: Patientos wound was initiated by him falling into glass causing multiple lacerations though the hand laceration is the worst. Modifying Factors: Patient has recently been on antibiotics for this wound and was soaking it in peroxide for a time. Most recently antibiotic cream has been used Associated Signs and Symptoms: Uncontrolled DM Type II HPI Description: 10/04/16 on evaluation today patient presents for initial evaluation concerning a laceration of the right wrist and hand on the bowler aspect. He tells me that he fell into glass following a medication change in regard to his diabetes. He initially went to [facility] where she was sutured and subsequently his daughter who is a Marine scientist removed the teachers at the appropriate time. Unfortunately the wound has done poorly and dehissed which has caused him some trouble including infection.  He has been on antibiotics both topically and orally though the wound continues to show signs of necrotic tissue according to notes which is what he was sent to [place] for evaluation concerning. He does have diabetes and unfortunately this is severely uncontrolled. He does see an endocrinologist and is working on this. 10/11/2016 -- the patient's notes from the ER were reviewed and he had his suturing done on 09/17/2016 and had necrotic skin  flaps which had to be trimmed the last visit he was here. He has managed to get his Santyl ointment and has been using this appropriately. Electronic Signature(s) Signed: 10/11/2016 3:36:40 PM By: Christin Fudge MD, FACS Entered By: Christin Fudge on 10/11/2016 15:36:39 [name] ([id number]) -------------------------------------------------------------------------------- Physical Exam Details Patient Name: [name] Date of Service: 10/11/2016 2:30 PM Medical Record Number: [id number] Patient Account Number: [id number] Date of Birth/Sex: 1945-04-23 (70 y.o. Male) Treating RN: Montey Hora Primary Care Provider: [name] Other Clinician: Referring Provider: [name] Treating Provider/Extender: Frann Rider in Treatment: 1 Constitutional . Pulse regular. Respirations normal and unlabored. Afebrile. . Eyes Nonicteric. Reactive to light. Ears, Nose, Mouth, and Throat Lips, teeth, and gums WNL.[name] Kitchen Moist mucosa without lesions. Neck supple and nontender. No palpable supraclavicular or cervical adenopathy. Normal sized without goiter. Respiratory WNL. No retractions.. Breath sounds WNL, No rubs, rales, rhonchi, or wheeze.. Cardiovascular Heart rhythm and rate regular, no murmur or gallop.. Pedal Pulses WNL. No clubbing, cyanosis or edema. Chest Breasts symmetical and no nipple discharge.. Breast tissue WNL, no masses, lumps, or tenderness.. Lymphatic No adneopathy. No adenopathy. No adenopathy. Musculoskeletal Adexa  without tenderness or enlargement.. Digits and nails w/o clubbing, cyanosis, infection, petechiae, ischemia, or inflammatory conditions.. Integumentary (Hair, Skin) No suspicious lesions. No crepitus or fluctuance. No peri-wound warmth or erythema. No masses.[name] Kitchen Psychiatric Judgement and insight Intact.. No evidence of depression, anxiety, or agitation.. Notes the right, wrist continues to have necrotic debris and I have sharply remove this with a #3 curet and minimal bleeding controlled with pressure Electronic Signature(s) Signed: 10/11/2016 3:37:24 PM By: Christin Fudge MD, FACS Entered By: Christin Fudge on 10/11/2016 15:37:23 [name] ([id number]) -------------------------------------------------------------------------------- Physician Orders Details Patient Name: [name] Date of Service: 10/11/2016 2:30 PM Medical Record Number: [id number] Patient Account Number: [id number] Date of Birth/Sex: May 18, 1945 (70 y.o. Male) Treating RN: Montey Hora Primary Care Provider: [name] Other Clinician: Referring Provider: [name] Treating Provider/Extender: Frann Rider in Treatment: 1 Verbal / Phone Orders: No Diagnosis Coding ICD-10 Coding Code Description E11.622 Type 2 diabetes mellitus with other skin ulcer S41.101A Unspecified open wound of right upper arm, initial encounter L98.492 Non-pressure chronic ulcer of skin of other sites with fat layer exposed Wound Cleansing Wound #1 Right,Posterior Wrist o Clean wound with Normal Saline. o May Shower, gently pat wound dry prior to applying new dressing. Anesthetic Wound #1 Right,Posterior Wrist o Topical Lidocaine 4% cream applied to wound bed prior to debridement o Injected 2% Lidocaine without epinephrine prior to debridement Primary Wound Dressing Wound #1 Right,Posterior Wrist o Santyl Ointment Secondary Dressing Wound #1 Right,Posterior Wrist o Gauze and Kerlix/Conform -  secure with tape Dressing Change Frequency Wound #1 Right,Posterior Wrist o Change dressing every day. Follow-up Appointments Wound #1 Right,Posterior Wrist o Return Appointment in 1 week. Additional Orders / Instructions Wound #1 Right,Posterior Wrist [name], [name]. ([id number]) o Increase protein intake. o Other: - Please try to keep blood sugars below 180. Please add vitamin A, vitamin C and zinc supplements to your diet Medications-please add to medication list. Wound #1 Right,Posterior Wrist o Santyl Enzymatic Ointment Electronic Signature(s) Signed: 10/11/2016 4:25:24 PM By: Christin Fudge MD, FACS Signed: 10/11/2016 4:56:49 PM By: Montey Hora Entered By: Montey Hora on 10/11/2016 15:11:44 [name] ([id number]) -------------------------------------------------------------------------------- Problem List Details Patient Name: [name] Date of Service: 10/11/2016 2:30 PM Medical Record Number: [id number] Patient Account Number: [id number] Date of Birth/Sex: 1945-12-15 (71 y.o. 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 Male) Treating RN: Montey Hora Primary Care Provider: [name] Other Clinician: Referring Provider: [name] Treating Provider/Extender: Frann Rider in Treatment: 1 Subjective Chief Complaint Information obtained from Patient Right forearm and hand traumatic ulcer History of Present Illness (HPI) The following HPI elements were documented for the patient's wound: Location: Right forearm and hand Quality: Patient states that he has a mild sore sensation Severity: 1/10 Duration: For approximately 2 weeks prior to initial evaluation in the wound center Timing: With cleansing of the wound or pressure in general Context: Patient s wound was initiated by him falling into glass causing multiple lacerations though the hand laceration is the worst. Modifying Factors: Patient has recently been on antibiotics for this wound and was soaking it in peroxide for a time. Most recently antibiotic cream has been used Associated Signs and Symptoms: Uncontrolled DM Type II 10/04/16 on evaluation today patient presents for initial evaluation concerning a laceration of the right wrist and hand on the bowler aspect. He tells me that he fell into glass following a medication change in regard to his diabetes. He initially went to [facility] [place] [facility] ER where she was sutured and subsequently his daughter who is a Marine scientist removed the teachers at the appropriate time. Unfortunately the wound has done poorly and dehissed which has caused him some trouble including infection. He has been on antibiotics both topically and orally though the wound continues to show signs of necrotic tissue according to notes which is what he was sent to [place] for evaluation concerning. He does have diabetes and unfortunately this is  severely uncontrolled. He does see an endocrinologist and is working on this. 10/11/2016 -- the patient's notes from the ER were reviewed and he had his suturing done on 09/17/2016 and had necrotic skin flaps which had to be trimmed the last visit he was here. He has managed to get his Santyl ointment and has been using this appropriately. [name], [name] ([id number]) Objective Constitutional Pulse regular. Respirations normal and unlabored. Afebrile. Vitals Time Taken: 2:45 PM, Height: 70 in, Weight: 241 lbs, BMI: 34.6, Temperature: 97.8 F, Pulse: 100 bpm, Respiratory Rate: 18 breaths/min, Blood Pressure: 173/75 mmHg. Eyes Nonicteric. Reactive to light. Ears, Nose, Mouth, and Throat Lips, teeth, and gums WNL.[name] Kitchen Moist mucosa without lesions. Neck supple and nontender. No palpable supraclavicular or cervical adenopathy. Normal sized without goiter. Respiratory WNL. No retractions.. Breath sounds WNL, No rubs, rales, rhonchi, or wheeze.. Cardiovascular Heart rhythm and rate regular, no murmur or gallop.. Pedal Pulses WNL. No clubbing, cyanosis or edema. Chest Breasts symmetical and no nipple discharge.. Breast tissue WNL, no masses, lumps, or tenderness.. Lymphatic No adneopathy. No adenopathy. No adenopathy. Musculoskeletal Adexa without tenderness or enlargement.. Digits and  nails w/o clubbing, cyanosis, infection, petechiae, ischemia, or inflammatory conditions.[name] Kitchen Psychiatric Judgement and insight Intact.. No evidence of depression, anxiety, or agitation.. General Notes: the right, wrist continues to have necrotic debris and I have sharply remove this with a #3 curet and minimal bleeding controlled with pressure Integumentary (Hair, Skin) No suspicious lesions. No crepitus or fluctuance. No peri-wound warmth or erythema. No masses.. Wound #1 status is Open. Original cause of wound was Trauma. The wound is located on the Right,Posterior Wrist. The wound measures 5.2cm length x  1.7cm width x 0.3cm depth; 6.943cm^2 area and 2.083cm^3 volume. There is Fat Layer (Subcutaneous Tissue) Exposed exposed. There is no tunneling or undermining noted. There is a large amount of serous drainage noted. The wound margin is flat and intact. There is medium (34-66%) pink granulation within the wound bed. There is a medium (34-66%) NIMAI, BURBACH E. ([id number]) amount of necrotic tissue within the wound bed including Eschar and Adherent Slough. The periwound skin appearance did not exhibit: Callus, Crepitus, Excoriation, Induration, Rash, Scarring, Dry/Scaly, Maceration, Atrophie Blanche, Cyanosis, Ecchymosis, Hemosiderin Staining, Mottled, Pallor, Rubor, Erythema. The periwound has tenderness on palpation. Wound #2 status is Healed - Epithelialized. Original cause of wound was Trauma. The wound is located on the Right Hand - 4th Digit. The wound measures 0cm length x 0cm width x 0cm depth; 0cm^2 area and 0cm^3 volume. 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 There was a Skin/Subcutaneous Tissue Debridement ([id number]) debridement with total area of 8.84 sq cm performed by Christin Fudge, MD. with the following instrument(s): Curette to remove Viable and Non-Viable tissue/material including Fibrin/Slough, Eschar, and Subcutaneous after achieving pain control using Lidocaine 4% Topical Solution. A time out was conducted at 15:10, prior to the start of the procedure. A Minimum amount of bleeding was controlled with Pressure. The procedure was tolerated well with a pain level of 0 throughout and a pain level of 0 following the procedure. Post Debridement Measurements:  5.2cm length x 1.7cm width x 0.4cm depth; 2.777cm^3 volume. Character of Wound/Ulcer Post Debridement is improved. Post procedure Diagnosis Wound #1: Same as Pre-Procedure Plan Wound Cleansing: Wound #1 Right,Posterior Wrist: [name], [name]. ([id number]) Clean wound with Normal Saline. May Shower, gently pat wound dry prior to applying new dressing. Anesthetic: Wound #1 Right,Posterior Wrist: Topical Lidocaine 4% cream applied to wound bed prior to debridement Injected 2% Lidocaine without epinephrine prior to debridement Primary Wound Dressing: Wound #1 Right,Posterior Wrist: Santyl Ointment Secondary Dressing: Wound #1 Right,Posterior Wrist: Gauze and Kerlix/Conform - secure with tape Dressing Change Frequency: Wound #1 Right,Posterior Wrist: Change dressing every day. Follow-up Appointments: Wound #1 Right,Posterior Wrist: Return Appointment in 1 week. Additional Orders / Instructions: Wound #1 Right,Posterior Wrist: Increase protein intake. Other: - Please try to keep blood sugars below 180. Please add vitamin A, vitamin C and zinc supplements to your diet Medications-please add to medication list.: Wound #1 Right,Posterior Wrist: Santyl Enzymatic Ointment after sharp debridement today I have recommended: 1. Continue to wash well with soap and water and apply Santyl ointment daily 2. Adequate protein, vitamin A, vitamin C and zinc 3. Regular physician the wound center Electronic Signature(s) Signed: 10/11/2016 3:38:06 PM By: Christin Fudge MD, FACS Entered By: Con Memos  Deema Juncaj on 10/11/2016 15:38:05 [name], [name] ([id number]) -------------------------------------------------------------------------------- [place] Details Patient Name: [name] Date of Service: 10/11/2016 Medical Record Number: [id number] Patient Account Number: [id number] Date of Birth/Sex: 11-21-1945 (71 y.o. Male) Treating RN: Montey Hora Primary Care Provider: [name] Other  Clinician: Referring Provider: [name] Treating Provider/Extender: Frann Rider in Treatment: 1 Diagnosis Coding ICD-10 Codes Code Description E11.622 Type 2 diabetes mellitus with other skin ulcer S41.101A Unspecified open wound of right upper arm, initial encounter L98.492 Non-pressure chronic ulcer of skin of other sites with fat layer exposed Facility Procedures CPT4 Code Description: [id number] 11042 - DEB SUBQ TISSUE 20 SQ CM/< ICD-10 Description Diagnosis E11.622 Type 2 diabetes mellitus with other skin ulcer S41.101A Unspecified open wound of right upper arm, initial L98.492 Non-pressure chronic ulcer of skin  of other sites w Modifier: encounter ith fat layer Quantity: 1 exposed Physician Procedures CPT4 Code Description: [id number] - WC PHYS SUBQ TISS 20 SQ CM ICD-10 Description Diagnosis E11.622 Type 2 diabetes mellitus with other skin ulcer S41.101A Unspecified open wound of right upper arm, initial e L98.492 Non-pressure chronic ulcer of skin  of other sites wi Modifier: ncounter th fat layer Quantity: 1 exposed Electronic Signature(s) Signed: 10/11/2016 3:38:16 PM By: Christin Fudge MD, FACS Entered By: Christin Fudge on 10/11/2016 15:38:15

## 2016-10-12 NOTE — Progress Notes (Signed)
[place] ([id number]) Visit Report for 10/11/2016 Arrival Information Details Patient Name: [name], [name] Date of Service: 10/11/2016 2:30 PM Medical Record Number: [id number] Patient Account Number: [id number] Date of Birth/Sex: 1945-12-18 (71 y.o. Male) Treating RN: Montey Hora Primary Care Seletha Zimmermann: [name] Other Clinician: Referring Caitland Porchia: [name] Treating Shelbylynn Walczyk/Extender: Frann Rider in Treatment: 1 Visit Information History Since Last Visit Added or deleted any medications: No Patient Arrived: Ambulatory Any new allergies or adverse reactions: No Arrival Time: 14:44 Had a fall or experienced change in No Accompanied By: self activities of daily living that may affect Transfer Assistance: None risk of falls: Patient Identification Verified: Yes Signs or symptoms of abuse/neglect since last No Secondary Verification Process Yes visito Completed: Hospitalized since last visit: No Patient Has Alerts: Yes Has Dressing in Place as Prescribed: Yes Patient Alerts: DMII Pain Present Now: No Electronic Signature(s) Signed: 10/11/2016 4:56:49 PM By: Montey Hora Entered By: Montey Hora on 10/11/2016 14:45:39 [name] ([id number]) -------------------------------------------------------------------------------- Encounter Discharge Information Details Patient Name: [name] Date of Service: 10/11/2016 2:30 PM Medical Record Number: [id number] Patient Account Number: [id number] Date of Birth/Sex: 1945-04-15 (70 y.o. Male) Treating RN: Montey Hora Primary Care Lyndell Gillyard: [name] Other Clinician: Referring Patton Rabinovich: [name] Treating Aphrodite Harpenau/Extender: Frann Rider in Treatment: 1 Encounter Discharge Information Items Discharge Pain Level: 0 Discharge Condition: Stable Ambulatory Status: Ambulatory Discharge Destination: Home Transportation: Private Auto Accompanied By: self Schedule  Follow-up Appointment: Yes Medication Reconciliation completed and provided to Patient/Care No Kayron Hicklin: Provided on Clinical Summary of Care: 10/11/2016 Form Type Recipient Paper Patient JJ Electronic Signature(s) Signed: 10/11/2016 3:18:46 PM By: Ruthine Dose Entered By: Ruthine Dose on 10/11/2016 15:18:45 [name] ([id number]) -------------------------------------------------------------------------------- Multi Wound Chart Details Patient Name: [name] Date of Service: 10/11/2016 2:30 PM Medical Record Number: [id number] Patient Account Number: [id number] Date of Birth/Sex: 11/13/1945 (70 y.o. Male) Treating RN: Montey Hora Primary Care Susy Placzek: [name] Other Clinician: Referring Shirlyn Savin: [name] Treating Temiloluwa Recchia/Extender: Frann Rider in Treatment: 1 Vital Signs Height(in): 70 Pulse(bpm): 100 Weight(lbs): 241 Blood Pressure 173/75 (mmHg): Body Mass Index(BMI): 35 Temperature(F): 97.8 Respiratory Rate 18 (breaths/min): Photos: [1:No Photos] [2:No Photos] [N/A:N/A] Wound Location: [1:Right Wrist - Posterior] [2:Right Hand - 4th Digit] [N/A:N/A] Wounding Event: [1:Trauma] [2:Trauma] [N/A:N/A] Primary Etiology: [1:Trauma, Other] [2:Trauma, Other] [N/A:N/A] Comorbid History: [1:Hypertension, Type II Diabetes, Gout, Osteoarthritis, Dementia, Neuropathy] [2:N/A] [N/A:N/A] Date Acquired: [1:09/20/2016] [2:09/20/2016] [N/A:N/A] Weeks of Treatment: [1:1] [2:1] [N/A:N/A] Wound Status: [1:Open] [2:Healed - Epithelialized] [N/A:N/A] Measurements L x W x D 5.2x1.7x0.3 [2:0x0x0] [N/A:N/A] (cm) Area (cm) : [1:6.943] [2:0] [N/A:N/A] Volume (cm) : [1:2.083] [2:0] [N/A:N/A] % Reduction in Area: [1:49.50%] [2:100.00%] [N/A:N/A] % Reduction in Volume: 49.50% [2:100.00%] [N/A:N/A] Classification: [1:Full Thickness With Exposed Support Structures] [2:Partial Thickness] [N/A:N/A] Exudate Amount: [1:Large] [2:N/A] [N/A:N/A] Exudate Type:  [1:Serous] [2:N/A] [N/A:N/A] Exudate Color: [1:amber] [2:N/A] [N/A:N/A] Wound Margin: [1:Flat and Intact] [2:N/A] [N/A:N/A] Granulation Amount: [1:Medium (34-66%)] [2:N/A] [N/A:N/A] Granulation Quality: [1:Pink] [2:N/A] [N/A:N/A] Necrotic Amount: [1:Medium (34-66%)] [2:N/A] [N/A:N/A] Necrotic Tissue: [1:Eschar, Adherent Slough] [2:N/A] [N/A:N/A] Exposed Structures: [1:Fat Layer (Subcutaneous Tissue) Exposed: Yes] [2:N/A] [N/A:N/A] Fascia: No Tendon: No Muscle: No Joint: No Bone: No Epithelialization: None N/A N/A Debridement: Debridement (59563- N/A N/A 11047) Pre-procedure 15:10 N/A N/A Verification/Time Out Taken: Pain Control: Lidocaine 4% Topical N/A N/A Solution Tissue Debrided: Necrotic/Eschar, N/A N/A Fibrin/Slough, Subcutaneous Level: Skin/Subcutaneous N/A N/A Tissue Debridement Area (sq 8.84 N/A N/A cm): Instrument: Curette N/A N/A Bleeding: Minimum N/A N/A Hemostasis Achieved: Pressure N/A N/A  Procedural Pain: 0 N/A N/A Post Procedural Pain: 0 N/A N/A Debridement Treatment Procedure was tolerated N/A N/A Response: well Post Debridement 5.2x1.7x0.4 N/A N/A Measurements L x W x D (cm) Post Debridement 2.777 N/A N/A Volume: (cm) Periwound Skin Texture: Excoriation: No No Abnormalities Noted N/A Induration: No Callus: No Crepitus: No Rash: No Scarring: No Periwound Skin Maceration: No No Abnormalities Noted N/A Moisture: Dry/Scaly: No Periwound Skin Color: Atrophie Blanche: No No Abnormalities Noted N/A Cyanosis: No Ecchymosis: No Erythema: No Hemosiderin Staining: No Mottled: No Pallor: No Rubor: No Tenderness on Yes No N/A Palpation: [name], [name] ([id number]) Wound Preparation: Ulcer Cleansing: N/A N/A Rinsed/Irrigated with Saline Topical Anesthetic Applied: Other: lidocaine 4% Procedures Performed: Debridement N/A N/A Treatment Notes Electronic Signature(s) Signed: 10/11/2016 3:35:23 PM By: Christin Fudge MD, FACS Entered By: Christin Fudge on 10/11/2016 15:35:22 [name] ([id number]) -------------------------------------------------------------------------------- Multi-Disciplinary Care Plan Details Patient Name: [name] Date of Service: 10/11/2016 2:30 PM Medical Record Number: [id number] Patient Account Number: [id number] Date of Birth/Sex: February 14, 1946 (70 y.o. Male) Treating RN: Montey Hora Primary Care Azia Toutant: [name] Other Clinician: Referring Veleda Mun: [name] Treating Oiva Dibari/Extender: Frann Rider in Treatment: 1 Active Inactive ` Abuse / Safety / Falls / Self Care Management Nursing Diagnoses: Impaired physical mobility Goals: Patient will remain injury free related to falls Date Initiated: 10/04/2016 Target Resolution Date: 12/03/2016 Goal Status: Active Interventions: Assess fall risk on admission and as needed Notes: ` Orientation to the Wound Care Program Nursing Diagnoses: Knowledge deficit related to the wound healing center program Goals: Patient/caregiver will verbalize understanding of the [place] Program Date Initiated: 10/04/2016 Target Resolution Date: 12/03/2016 Goal Status: Active Interventions: Provide education on orientation to the wound center Notes: ` Wound/Skin Impairment Nursing Diagnoses: Impaired tissue integrity [name], [name] ([id number]) Goals: Patient/caregiver will verbalize understanding of skin care regimen Date Initiated: 10/04/2016 Target Resolution Date: 12/03/2016 Goal Status: Active Ulcer/skin breakdown will have a volume reduction of 30% by week 4 Date Initiated: 10/04/2016 Target Resolution Date: 12/03/2016 Goal Status: Active Ulcer/skin breakdown will have a volume reduction of 50% by week 8 Date Initiated: 10/04/2016 Target Resolution Date: 12/03/2016 Goal Status: Active Ulcer/skin breakdown will have a volume reduction of 80% by week 12 Date Initiated: 10/04/2016 Target Resolution Date:  12/03/2016 Goal Status: Active Ulcer/skin breakdown will heal within 14 weeks Date Initiated: 10/04/2016 Target Resolution Date: 12/03/2016 Goal Status: Active Interventions: Assess patient/caregiver ability to obtain necessary supplies Assess patient/caregiver ability to perform ulcer/skin care regimen upon admission and as needed Assess ulceration(s) every visit Notes: Electronic Signature(s) Signed: 10/11/2016 4:56:49 PM By: Montey Hora Entered By: Montey Hora on 10/11/2016 14:53:12 [name] ([id number]) -------------------------------------------------------------------------------- Pain Assessment Details Patient Name: [name] Date of Service: 10/11/2016 2:30 PM Medical Record Number: [id number] Patient Account Number: [id number] Date of Birth/Sex: [birth year] (71 y.o. Male) Treating RN: Montey Hora Primary Care Michel Hendon: [name] Other Clinician: Referring Amar Sippel: [name] Treating Arnold Depinto/Extender: Frann Rider in Treatment: 1 Active Problems Location of Pain Severity and Description of Pain Patient Has Paino No Site Locations Pain Management and Medication Current Pain Management: Notes Topical or injectable lidocaine is offered to patient for acute pain when surgical debridement is performed. If needed, Patient is instructed to use over the counter pain medication for the following 24-48 hours after debridement. Wound care MDs do not prescribed pain medications. Patient has chronic pain or uncontrolled pain. Patient has been instructed to make an appointment with their Primary Care Physician for  pain management. Electronic Signature(s) Signed: 10/11/2016 4:56:49 PM By: Montey Hora Entered By: Montey Hora on 10/11/2016 14:45:51 [name] ([id number]) -------------------------------------------------------------------------------- Patient/Caregiver Education Details Patient Name: [name] Date of  Service: 10/11/2016 2:30 PM Medical Record Number: [id number] Patient Account Number: [id number] Date of Birth/Gender: 1945/07/25 (70 y.o. Male) Treating RN: Montey Hora Primary Care Physician: [name] Other Clinician: Referring Physician: [name] Treating Physician/Extender: Frann Rider in Treatment: 1 Education Assessment Education Provided To: Patient Education Topics Provided Wound/Skin Impairment: Handouts: Other: wound care as ordered Methods: Demonstration, Explain/Verbal Responses: State content correctly Electronic Signature(s) Signed: 10/11/2016 4:56:49 PM By: Montey Hora Entered By: Montey Hora on 10/11/2016 15:09:14 [name] ([id number]) -------------------------------------------------------------------------------- Wound Assessment Details Patient Name: [name] Date of Service: 10/11/2016 2:30 PM Medical Record Number: [id number] Patient Account Number: [id number] Date of Birth/Sex: [birth year] (70 y.o. Male) Treating RN: Montey Hora Primary Care Braxson Hollingsworth: [name] Other Clinician: Referring Kelisha Dall: [name] Treating Masiah Woody/Extender: Frann Rider in Treatment: 1 Wound Status Wound Number: 1 Primary Trauma, Other Etiology: Wound Location: Right Wrist - Posterior Wound Open Wounding Event: Trauma Status: Date Acquired: 09/20/2016 Comorbid Hypertension, Type II Diabetes, Gout, Weeks Of Treatment: 1 History: Osteoarthritis, Dementia, Neuropathy Clustered Wound: No Photos Photo Uploaded By: Montey Hora on 10/11/2016 16:34:02 Wound Measurements Length: (cm) 5.2 Width: (cm) 1.7 Depth: (cm) 0.3 Area: (cm) 6.943 Volume: (cm) 2.083 % Reduction in Area: 49.5% % Reduction in Volume: 49.5% Epithelialization: None Tunneling: No Undermining: No Wound Description Full Thickness With Exposed Classification: Support Structures Wound Margin: Flat and  Intact Exudate Large Amount: Exudate Type: Serous Exudate Color: amber Foul Odor After Cleansing: No Slough/Fibrino Yes Wound Bed Granulation Amount: Medium (34-66%) Exposed Structure Granulation Quality: Pink Fascia Exposed: No Necrotic Amount: Medium (34-66%) Fat Layer (Subcutaneous Tissue) Exposed: Yes [name], [name] ([id number]) Necrotic Quality: Eschar, Adherent Slough Tendon Exposed: No Muscle Exposed: No Joint Exposed: No Bone Exposed: No Periwound Skin Texture Texture Color No Abnormalities Noted: No No Abnormalities Noted: No Callus: No Atrophie Blanche: No Crepitus: No Cyanosis: No Excoriation: No Ecchymosis: No Induration: No Erythema: No Rash: No Hemosiderin Staining: No Scarring: No Mottled: No Pallor: No Moisture Rubor: No No Abnormalities Noted: No Dry / Scaly: No Temperature / Pain Maceration: No Tenderness on Palpation: Yes Wound Preparation Ulcer Cleansing: Rinsed/Irrigated with Saline Topical Anesthetic Applied: Other: lidocaine 4%, Treatment Notes Wound #1 (Right, Posterior Wrist) 1. Cleansed with: Clean wound with Normal Saline 2. Anesthetic Topical Lidocaine 4% cream to wound bed prior to debridement 4. Dressing Applied: Santyl Ointment 5. Secondary Dressing Applied Gauze and Kerlix/Conform 7. Secured with Recruitment consultant) Signed: 10/11/2016 4:56:49 PM By: Montey Hora Entered By: Montey Hora on 10/11/2016 14:52:40 [name] ([id number]) -------------------------------------------------------------------------------- Wound Assessment Details Patient Name: [name] Date of Service: 10/11/2016 2:30 PM Medical Record Number: [id number] Patient Account Number: [id number] Date of Birth/Sex: 1945-08-17 (71 y.o. Male) Treating RN: Montey Hora Primary Care Nyquan Selbe: [name] Other Clinician: Referring Donetta Isaza: [name] Treating Qianna Clagett/Extender: Frann Rider in Treatment:  1 Wound Status Wound Number: 2 Primary Etiology: Trauma, Other Wound Location: Right Hand - 4th Digit Wound Status: Healed - Epithelialized Wounding Event: Trauma Date Acquired: 09/20/2016 Weeks Of Treatment: 1 Clustered Wound: No Photos Photo Uploaded By: Montey Hora on 10/11/2016 16:36:01 Wound Measurements Length: (cm) 0 % Reduction Width: (cm) 0 % Reduction Depth: (cm) 0 Area: (cm) 0 Volume: (cm) 0 in Area: 100% in Volume: 100% Wound Description Classification: Partial Thickness Periwound Skin  Texture Texture Color No Abnormalities Noted: No No Abnormalities Noted: No Moisture No Abnormalities Noted: No Electronic Signature(s) Signed: 10/11/2016 4:56:49 PM By: Nanci Pina, Grier Rocher ([id number]) Entered By: Montey Hora on 10/11/2016 14:51:26 [name] ([id number]) -------------------------------------------------------------------------------- [place] Details Patient Name: [name] Date of Service: 10/11/2016 2:30 PM Medical Record Number: [id number] Patient Account Number: [id number] Date of Birth/Sex: 12-Jun-1945 (70 y.o. Male) Treating RN: Montey Hora Primary Care Jsiah Menta: [name] Other Clinician: Referring Thayer Embleton: [name] Treating Alzada Brazee/Extender: Frann Rider in Treatment: 1 Vital Signs Time Taken: 14:45 Temperature (F): 97.8 Height (in): 70 Pulse (bpm): 100 Weight (lbs): 241 Respiratory Rate (breaths/min): 18 Body Mass Index (BMI): 34.6 Blood Pressure (mmHg): 173/75 Reference Range: 80 - 120 mg / dl Electronic Signature(s) Signed: 10/11/2016 4:56:49 PM By: Montey Hora Entered By: Montey Hora on 10/11/2016 14:46:50

## 2016-10-14 ENCOUNTER — Ambulatory Visit (INDEPENDENT_AMBULATORY_CARE_PROVIDER_SITE_OTHER): Payer: Medicare Other

## 2016-10-14 DIAGNOSIS — E349 Endocrine disorder, unspecified: Secondary | ICD-10-CM | POA: Diagnosis not present

## 2016-10-14 MED ORDER — TESTOSTERONE CYPIONATE 200 MG/ML IM SOLN
150.0000 mg | Freq: Once | INTRAMUSCULAR | Status: AC
  Administered 2016-10-14: 150 mg via INTRAMUSCULAR

## 2016-10-20 ENCOUNTER — Other Ambulatory Visit: Payer: Self-pay | Admitting: Pharmacist

## 2016-10-20 NOTE — Patient Outreach (Signed)
Call to follow up with Mr. [name] regarding his Humulin U-500 insulin and the [employer] Patient Assistance program.   Mr. [name] reports that he is no longer taking the Humulin U-500. Reports that he had a significant fall several weeks ago. Reports that he had started having dizziness. Reports that he was instructed to stop his Humulin U-500 and switch back to his NPH and Regular insulin regimen by his PCP. Reports that since switching back to the NPH and Regular insulin regimen, he has not had any further falls. States that he has not yet followed up with Dr. Cruzita Lederer because she was out of town, but that he will follow up with his endocrinologist this week.   Mr. [name] denies any further medication questions/concerns at this time. Patient confirms that he has my phone number. Will close pharmacy episode.  [name], PharmD, [facility] [phone]

## 2016-10-21 ENCOUNTER — Encounter: Payer: Medicare Other | Admitting: Physician Assistant

## 2016-10-21 DIAGNOSIS — S41101A Unspecified open wound of right upper arm, initial encounter: Secondary | ICD-10-CM | POA: Diagnosis not present

## 2016-10-21 DIAGNOSIS — Z87891 Personal history of nicotine dependence: Secondary | ICD-10-CM | POA: Diagnosis not present

## 2016-10-21 DIAGNOSIS — L98492 Non-pressure chronic ulcer of skin of other sites with fat layer exposed: Secondary | ICD-10-CM | POA: Diagnosis not present

## 2016-10-21 DIAGNOSIS — E11622 Type 2 diabetes mellitus with other skin ulcer: Secondary | ICD-10-CM | POA: Diagnosis not present

## 2016-10-21 DIAGNOSIS — M109 Gout, unspecified: Secondary | ICD-10-CM | POA: Diagnosis not present

## 2016-10-21 DIAGNOSIS — I10 Essential (primary) hypertension: Secondary | ICD-10-CM | POA: Diagnosis not present

## 2016-10-23 NOTE — Progress Notes (Signed)
[name] ([id number]) Visit Report for 10/21/2016 Arrival Information Details Patient Name: [name] Date of Service: 10/21/2016 2:30 PM Medical Record Number: [id number] Patient Account Number: [id number] Date of Birth/Sex: Dec 16, 1945 (71 y.o. Male) Treating RN: [name] Primary Care [name]: [name] Other Clinician: Referring [name]: [name] Treating [name]/Extender: [name], [name] Weeks in Treatment: 2 Visit Information History Since Last Visit Added or deleted any medications: No Patient Arrived: Ambulatory Any new allergies or adverse reactions: No Arrival Time: 14:45 Had a fall or experienced change in No Accompanied By: self activities of daily living that may affect Transfer Assistance: None risk of falls: Patient Identification Verified: Yes Signs or symptoms of abuse/neglect since last No Secondary Verification Process Yes visito Completed: Hospitalized since last visit: No Patient Has Alerts: Yes Has Dressing in Place as Prescribed: Yes Patient Alerts: DMII Pain Present Now: No Electronic Signature(s) Signed: 10/21/2016 5:28:45 PM By: [name] Entered By: [name] on 10/21/2016 14:46:05 [name] ([id number]) -------------------------------------------------------------------------------- Encounter Discharge Information Details Patient Name: [name] Date of Service: 10/21/2016 2:30 PM Medical Record Number: [id number] Patient Account Number: [id number] Date of Birth/Sex: [birth year] (70 y.o. Male) Treating RN: [name] Primary Care [name]: [name] Other Clinician: Referring Lakaisha Danish: [name] Treating [name]/Extender: [name], [name] Weeks in Treatment: 2 Encounter Discharge Information Items Discharge Pain Level: 0 Discharge Condition: Stable Ambulatory Status: Ambulatory Discharge Destination: Home Transportation: Private Auto Accompanied By: self Schedule  Follow-up Appointment: Yes Medication Reconciliation completed and provided to Patient/Care No [name]: Provided on Clinical Summary of Care: 10/21/2016 Form Type Recipient Paper Patient [name] Electronic Signature(s) Signed: 10/21/2016 3:11:09 PM By: [name] Entered By: [name] on 10/21/2016 15:11:09 [name] ([id number]) -------------------------------------------------------------------------------- Multi Wound Chart Details Patient Name: [name] Date of Service: 10/21/2016 2:30 PM Medical Record Number: [id number] Patient Account Number: [id number] Date of Birth/Sex: 02/26/1946 (70 y.o. Male) Treating RN: [name] Primary Care Antonia Culbertson: [name] Other Clinician: Referring Matilynn Dacey: [name] Treating [name]/Extender: [name] Weeks in Treatment: 2 Vital Signs Height(in): 70 Pulse(bpm): 114 Weight(lbs): 241 Blood Pressure 160/78 (mmHg): Body Mass Index(BMI): 35 Temperature(F): 98.2 Respiratory Rate 18 (breaths/min): Photos: [1:No Photos] [N/A:N/A] Wound Location: [1:Right Wrist - Posterior] [N/A:N/A] Wounding Event: [1:Trauma] [N/A:N/A] Primary Etiology: [1:Trauma, Other] [N/A:N/A] Comorbid History: [1:Hypertension, Type II Diabetes, Gout, Osteoarthritis, Dementia, Neuropathy] [N/A:N/A] Date Acquired: [1:09/20/2016] [N/A:N/A] Weeks of Treatment: [1:2] [N/A:N/A] Wound Status: [1:Open] [N/A:N/A] Measurements L x W x D 2.3x0.9x0.1 [N/A:N/A] (cm) Area (cm) : [1:1.626] [N/A:N/A] Volume (cm) : [1:0.163] [N/A:N/A] % Reduction in Area: [1:88.20%] [N/A:N/A] % Reduction in Volume: 96.00% [N/A:N/A] Classification: [1:Full Thickness With Exposed Support Structures] [N/A:N/A] Exudate Amount: [1:Large] [N/A:N/A] Exudate Type: [1:Serous] [N/A:N/A] Exudate Color: [1:amber] [N/A:N/A] Wound Margin: [1:Flat and Intact] [N/A:N/A] Granulation Amount: [1:Large (67-100%)] [N/A:N/A] Granulation Quality: [1:Pink]  [N/A:N/A] Necrotic Amount: [1:Small (1-33%)] [N/A:N/A] Necrotic Tissue: [1:Eschar, Adherent Slough] [N/A:N/A] Exposed Structures: [1:Fat Layer (Subcutaneous Tissue) Exposed: Yes] [N/A:N/A] Fascia: No Tendon: No Muscle: No Joint: No Bone: No Epithelialization: None N/A N/A Periwound Skin Texture: Excoriation: No N/A N/A Induration: No Callus: No Crepitus: No Rash: No Scarring: No Periwound Skin Maceration: No N/A N/A Moisture: Dry/Scaly: No Periwound Skin Color: Atrophie Blanche: No N/A N/A Cyanosis: No Ecchymosis: No Erythema: No Hemosiderin Staining: No Mottled: No Pallor: No Rubor: No Tenderness on Yes N/A N/A Palpation: Wound Preparation: Ulcer Cleansing: N/A N/A Rinsed/Irrigated with Saline Topical Anesthetic Applied: Other: lidocaine 4% Treatment Notes Electronic Signature(s) Signed: 10/21/2016 5:28:45 PM By: [name] Entered  By: [name] on 10/21/2016 15:02:31 [name] ([id number]) -------------------------------------------------------------------------------- Multi-Disciplinary Care Plan Details Patient Name: [name] Date of Service: 10/21/2016 2:30 PM Medical Record Number: [id number] Patient Account Number: [id number] Date of Birth/Sex: [birth year] (71 y.o. Male) Treating RN: [name] Primary Care Joycelynn Fritsche: [name] Other Clinician: Referring [name]: [name] Treating [name]/Extender: [name], [name] Weeks in Treatment: 2 Active Inactive ` Abuse / Safety / Falls / Self Care Management Nursing Diagnoses: Impaired physical mobility Goals: Patient will remain injury free related to falls Date Initiated: 10/04/2016 Target Resolution Date: 12/03/2016 Goal Status: Active Interventions: Assess fall risk on admission and as needed Notes: ` Orientation to the [name] Nursing Diagnoses: Knowledge deficit related to the wound healing center [name] Goals: Patient/caregiver will verbalize  understanding of the [name] Date Initiated: 10/04/2016 Target Resolution Date: 12/03/2016 Goal Status: Active Interventions: Provide education on orientation to the wound center Notes: ` Wound/Skin Impairment Nursing Diagnoses: Impaired tissue integrity [name] ([id number]) Goals: Patient/caregiver will verbalize understanding of skin care regimen Date Initiated: 10/04/2016 Target Resolution Date: 12/03/2016 Goal Status: Active Ulcer/skin breakdown will have a volume reduction of 30% by week 4 Date Initiated: 10/04/2016 Target Resolution Date: 12/03/2016 Goal Status: Active Ulcer/skin breakdown will have a volume reduction of 50% by week 8 Date Initiated: 10/04/2016 Target Resolution Date: 12/03/2016 Goal Status: Active Ulcer/skin breakdown will have a volume reduction of 80% by week 12 Date Initiated: 10/04/2016 Target Resolution Date: 12/03/2016 Goal Status: Active Ulcer/skin breakdown will heal within 14 weeks Date Initiated: 10/04/2016 Target Resolution Date: 12/03/2016 Goal Status: Active Interventions: Assess patient/caregiver ability to obtain necessary supplies Assess patient/caregiver ability to perform ulcer/skin care regimen upon admission and as needed Assess ulceration(s) every visit Notes: Electronic Signature(s) Signed: 10/21/2016 5:28:45 PM By: [name] Entered By: [name] on 10/21/2016 15:02:23 [name] ([id number]) -------------------------------------------------------------------------------- Pain Assessment Details Patient Name: [name] Date of Service: 10/21/2016 2:30 PM Medical Record Number: [id number] Patient Account Number: [id number] Date of Birth/Sex: [birth year] (70 y.o. Male) Treating RN: [name] Primary Care Pauleen Goleman: [name] Other Clinician: Referring Gomer France: [name] Treating Lillard Bailon/Extender: [name], [name] Weeks in Treatment: 2 Active Problems Location of  Pain Severity and Description of Pain Patient Has Paino No Site Locations Pain Management and Medication Current Pain Management: Notes Topical or injectable lidocaine is offered to patient for acute pain when surgical debridement is performed. If needed, Patient is instructed to use over the counter pain medication for the following 24-48 hours after debridement. Wound care MDs do not prescribed pain medications. Patient has chronic pain or uncontrolled pain. Patient has been instructed to make an appointment with their Primary Care Physician for pain management. Electronic Signature(s) Signed: 10/21/2016 5:28:45 PM By: [name] Entered By: [name] on 10/21/2016 14:46:19 [name] ([id number]) -------------------------------------------------------------------------------- Patient/Caregiver Education Details Patient Name: [name] Date of Service: 10/21/2016 2:30 PM Medical Record Number: [id number] Patient Account Number: [id number] Date of Birth/Gender: [birth year] (70 y.o. Male) Treating RN: [name] Primary Care Physician: [name] Other Clinician: Referring Physician: Owens [name] Treating Physician/Extender: Sharalyn Ink in Treatment: 2 Education Assessment Education Provided To: Patient Education Topics Provided Wound/Skin Impairment: Handouts: Other: wound care as ordered Methods: Demonstration, Explain/Verbal Responses: State content correctly Electronic Signature(s) Signed: 10/21/2016 5:28:45 PM By: [name] Entered By: [name] on 10/21/2016 15:04:10 [name] ([id number]) -------------------------------------------------------------------------------- Wound Assessment Details Patient Name: [name] Date of Service: 10/21/2016 2:30 PM Medical  Record Number: [id number] Patient Account Number: [id number] Date of Birth/Sex: [birth year] (71 y.o. Male) Treating RN: [name] Primary  Care Audery Wassenaar: [name] Other Clinician: Referring Dreshawn Hendershott: [name] Treating Cagney Steenson/Extender: [name], [name] Weeks in Treatment: 2 Wound Status Wound Number: 1 Primary Trauma, Other Etiology: Wound Location: Right Wrist - Posterior Wound Open Wounding Event: Trauma Status: Date Acquired: 09/20/2016 Comorbid Hypertension, Type II Diabetes, Gout, Weeks Of Treatment: 2 History: Osteoarthritis, Dementia, Neuropathy Clustered Wound: No Photos Photo Uploaded By: [name] on 10/21/2016 15:20:45 Wound Measurements Length: (cm) 2.3 Width: (cm) 0.9 Depth: (cm) 0.1 Area: (cm) 1.626 Volume: (cm) 0.163 % Reduction in Area: 88.2% % Reduction in Volume: 96% Epithelialization: None Tunneling: No Undermining: No Wound Description Full Thickness With Exposed Classification: Support Structures Wound Margin: Flat and Intact Exudate Large Amount: Exudate Type: Serous Exudate Color: amber Foul Odor After Cleansing: No Slough/Fibrino Yes Wound Bed Granulation Amount: Large (67-100%) Exposed Structure Granulation Quality: Pink Fascia Exposed: No Necrotic Amount: Small (1-33%) Fat Layer (Subcutaneous Tissue) Exposed: Yes [name], [name] ([id number]) Necrotic Quality: Eschar, Adherent Slough Tendon Exposed: No Muscle Exposed: No Joint Exposed: No Bone Exposed: No Periwound Skin Texture Texture Color No Abnormalities Noted: No No Abnormalities Noted: No Callus: No Atrophie Blanche: No Crepitus: No Cyanosis: No Excoriation: No Ecchymosis: No Induration: No Erythema: No Rash: No Hemosiderin Staining: No Scarring: No Mottled: No Pallor: No Moisture Rubor: No No Abnormalities Noted: No Dry / Scaly: No Temperature / Pain Maceration: No Tenderness on Palpation: Yes Wound Preparation Ulcer Cleansing: Rinsed/Irrigated with Saline Topical Anesthetic Applied: Other: lidocaine 4%, Treatment Notes Wound #1 (Right, Posterior Wrist) 1. Cleansed  with: Clean wound with Normal Saline 2. Anesthetic Topical Lidocaine 4% cream to wound bed prior to debridement 4. Dressing Applied: Santyl Ointment 5. Secondary Dressing Applied Gauze and Kerlix/Conform 7. Secured with Recruitment consultant) Signed: 10/21/2016 5:28:45 PM By: [name] Entered By: [name] on 10/21/2016 15:02:06 [name] ([id number]) -------------------------------------------------------------------------------- [place] Details Patient Name: [name] Date of Service: 10/21/2016 2:30 PM Medical Record Number: [id number] Patient Account Number: [id number] Date of Birth/Sex: 1945-04-19 (71 y.o. Male) Treating RN: [name] Primary Care Chavonne Sforza: [name] Other Clinician: Referring Isaid Salvia: [name] Treating Suetta Hoffmeister/Extender: [name], [name] Weeks in Treatment: 2 Vital Signs Time Taken: 14:49 Temperature (F): 98.2 Height (in): 70 Pulse (bpm): 114 Weight (lbs): 241 Respiratory Rate (breaths/min): 18 Body Mass Index (BMI): 34.6 Blood Pressure (mmHg): 160/78 Reference Range: 80 - 120 mg / dl Electronic Signature(s) Signed: 10/21/2016 5:28:45 PM By: [name] Entered By: [name] on 10/21/2016 14:49:59

## 2016-10-25 NOTE — Progress Notes (Signed)
[name] ([id number]) Visit Report for 10/21/2016 Chief Complaint Document Details Patient Name: [name], [name] Date of Service: 10/21/2016 2:30 PM Medical Record Number: [id number] Patient Account Number: [id number] Date of Birth/Sex: 04-16-1945 (71 y.o. Male) Treating RN: Montey Hora Primary Care Provider: [name] Other Clinician: Referring Provider: [name] Treating Provider/Extender: Melburn Hake,  Weeks in Treatment: 2 Information Obtained from: Patient Chief Complaint Right forearm and hand traumatic ulcer Electronic Signature(s) Signed: 10/21/2016 5:22:22 PM By: Worthy Keeler [name] Entered By: Worthy Keeler on 10/21/2016 17:04:17 [name] ([id number]) -------------------------------------------------------------------------------- Debridement Details Patient Name: [name] Date of Service: 10/21/2016 2:30 PM Medical Record Number: [id number] Patient Account Number: [id number] Date of Birth/Sex: [birth year] (70 y.o. Male) Treating RN: Montey Hora Primary Care Provider: [name] Other Clinician: Referring Provider: [name] Treating Provider/Extender: Melburn Hake,  Weeks in Treatment: 2 Debridement Performed for Wound #1 Right,Posterior Wrist Assessment: Performed By: Physician [name],  E., [name] Debridement: Debridement Pre-procedure Verification/Time Out Yes - 15:02 Taken: Start Time: 15:02 Pain Control: Lidocaine 4% Topical Solution Level: Skin/Subcutaneous Tissue Total Area Debrided (L x 2.3 (cm) x 0.9 (cm) = 2.07 (cm) W): Tissue and other Viable, Non-Viable, Fibrin/Slough, Subcutaneous material debrided: Instrument: Curette Bleeding: Minimum Hemostasis Achieved: Pressure End Time: 15:05 Procedural Pain: 0 Post Procedural Pain: 0 Response to Treatment: Procedure was tolerated well Post Debridement Measurements of Total Wound Length: (cm) 2.3 Width: (cm) 0.9 Depth: (cm)  0.2 Volume: (cm) 0.325 Character of Wound/Ulcer Post Improved Debridement: Post Procedure Diagnosis Same as Pre-procedure Electronic Signature(s) Signed: 10/21/2016 5:22:22 PM By: Worthy Keeler [name] Signed: 10/21/2016 5:28:45 PM By: Montey Hora Entered By: Montey Hora on 10/21/2016 15:05:09 [name] ([id number]) -------------------------------------------------------------------------------- HPI Details Patient Name: [name] Date of Service: 10/21/2016 2:30 PM Medical Record Number: [id number] Patient Account Number: [id number] Date of Birth/Sex: [birth year] (71 y.o. Male) Treating RN: Montey Hora Primary Care Provider: [name] Other Clinician: Referring Provider: [name] Treating Provider/Extender: Melburn Hake,  Weeks in Treatment: 2 History of Present Illness Location: Right forearm and hand Quality: Patient states that he has a mild sore sensation Severity: 1/10 Duration: For approximately 2 weeks prior to initial evaluation in the wound center Timing: With cleansing of the wound or pressure in general Context: Patientos wound was initiated by him falling into glass causing multiple lacerations though the hand laceration is the worst. Modifying Factors: Patient has recently been on antibiotics for this wound and was soaking it in peroxide for a time. Most recently antibiotic cream has been used Associated Signs and Symptoms: Uncontrolled DM Type II HPI Description: 10/04/16 on evaluation today patient presents for initial evaluation concerning a laceration of the right wrist and hand on the bowler aspect. He tells me that he fell into glass following a medication change in regard to his diabetes. He initially went to [facility] where she was sutured and subsequently his daughter who is a Marine scientist removed the teachers at the appropriate time. Unfortunately the wound has done poorly and dehissed which has caused him some trouble  including infection. He has been on antibiotics both topically and orally though the wound continues to show signs of necrotic tissue according to notes which is what he was sent to [place] for evaluation concerning. He does have diabetes and unfortunately this is severely uncontrolled. He does see an endocrinologist and is working on this. 10/11/2016 -- the patient's notes from the ER were reviewed and he had his suturing done on  09/17/2016 and had necrotic skin flaps which had to be trimmed the last visit he was here. He has managed to get his Santyl ointment and has been using this appropriately. 10/21/16 patient's wound appears to be doing very well on evaluation today. He still has some Slough covering the wound bed but this is nowhere near as severe as when i saw this initially two weeks ago nor even my review of his pictures last week. I'm pleased with how this is progressing. Electronic Signature(s) Signed: 10/21/2016 5:22:22 PM By: Worthy Keeler [name] Entered By: Worthy Keeler on 10/21/2016 17:04:54 [name] ([id number]) -------------------------------------------------------------------------------- Physical Exam Details Patient Name: [name] Date of Service: 10/21/2016 2:30 PM Medical Record Number: [id number] Patient Account Number: [id number] Date of Birth/Sex: September 20, 1945 (71 y.o. Male) Treating RN: Montey Hora Primary Care Provider: [name] Other Clinician: Referring Provider: [name] Treating Provider/Extender: [name],  Weeks in Treatment: 2 Constitutional Well-nourished and well-hydrated in no acute distress. Respiratory normal breathing without difficulty. Psychiatric this patient is able to make decisions and demonstrates good insight into disease process. Alert and Oriented x 3. pleasant and cooperative. Notes Patient does have slough covering the wound bed which did require sharp debridement today and he tolerated this  without complication. Electronic Signature(s) Signed: 10/21/2016 5:22:22 PM By: Worthy Keeler [name] Entered By: Worthy Keeler on 10/21/2016 17:05:21 [name] ([id number]) -------------------------------------------------------------------------------- Physician Orders Details Patient Name: [name] Date of Service: 10/21/2016 2:30 PM Medical Record Number: [id number] Patient Account Number: [id number] Date of Birth/Sex: Jul 26, 1945 (71 y.o. Male) Treating RN: Montey Hora Primary Care Provider: [name] Other Clinician: Referring Provider: [name] Treating Provider/Extender: Melburn Hake,  Weeks in Treatment: 2 Verbal / Phone Orders: No Diagnosis Coding ICD-10 Coding Code Description E11.622 Type 2 diabetes mellitus with other skin ulcer S41.101A Unspecified open wound of right upper arm, initial encounter L98.492 Non-pressure chronic ulcer of skin of other sites with fat layer exposed Wound Cleansing Wound #1 Right,Posterior Wrist o Clean wound with Normal Saline. o May Shower, gently pat wound dry prior to applying new dressing. Anesthetic Wound #1 Right,Posterior Wrist o Topical Lidocaine 4% cream applied to wound bed prior to debridement o Injected 2% Lidocaine without epinephrine prior to debridement Primary Wound Dressing Wound #1 Right,Posterior Wrist o Santyl Ointment Secondary Dressing Wound #1 Right,Posterior Wrist o Gauze and Kerlix/Conform - secure with tape Dressing Change Frequency Wound #1 Right,Posterior Wrist o Change dressing every day. Follow-up Appointments Wound #1 Right,Posterior Wrist o Return Appointment in 1 week. Additional Orders / Instructions Wound #1 Right,Posterior Wrist KYMERE, FULL[place]. ([id number]) o Increase protein intake. o Other: - Please try to keep blood sugars below 180. Please add vitamin A, vitamin C and zinc supplements to your diet Medications-please add to medication  list. Wound #1 Right,Posterior Wrist o Santyl Enzymatic Ointment Notes Patient's wound appears to be doing very well and I'm gonna recommend that we continue with the Current wound care measures for the next week. Hopefully after next week we will be able to switch to [facility] to something else going forward. Whatever I am very pleased with how this has progressed. Electronic Signature(s) Signed: 10/21/2016 5:22:22 PM By: Worthy Keeler [name] Entered By: Worthy Keeler on 10/21/2016 17:11:37 [name] ([id number]) -------------------------------------------------------------------------------- Problem List Details Patient Name: [name] Date of Service: 10/21/2016 2:30 PM Medical Record Number: [id number] Patient Account Number: [id number] Date of Birth/Sex: 06-08-1945 (71 y.o. 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 Male) Treating RN: Montey Hora Primary Care Provider: [name] Other Clinician: Referring Provider:  [name] Treating Provider/Extender: Melburn Hake,  Weeks in Treatment: 2 Subjective Chief Complaint Information obtained from Patient Right forearm and hand traumatic ulcer History of Present Illness (HPI) The following HPI elements were documented for the patient's wound: Location: Right forearm and hand Quality: Patient states that he has a mild sore sensation Severity: 1/10 Duration: For approximately 2 weeks prior to initial evaluation in the wound center Timing: With cleansing of the wound or pressure in general Context: Patient s wound was initiated by him falling into glass causing multiple lacerations though the hand laceration is the worst. Modifying Factors: Patient has recently been on antibiotics for this wound and was soaking it in peroxide for a time. Most recently antibiotic cream has been used Associated Signs and Symptoms: Uncontrolled DM Type II 10/04/16 on evaluation today patient presents for initial evaluation concerning a laceration of the right wrist and hand on the bowler aspect. He tells me that he fell into glass following a medication change in regard to his diabetes. He initially went to [facility] where she was sutured and subsequently his daughter who is a Marine scientist removed the teachers at the appropriate time. Unfortunately the wound has done poorly and dehissed which has caused him some trouble including infection. He has been on antibiotics both topically and orally though the wound continues to show signs of necrotic tissue according to notes which is what he was sent to [place] for evaluation concerning. He does have diabetes and unfortunately this is severely uncontrolled. He does see an endocrinologist and is working on this. 10/11/2016 -- the patient's notes from the ER were reviewed and he had his suturing done on 09/17/2016 and had necrotic skin flaps which had to be trimmed the last visit he was here. He has managed to get his Santyl ointment  and has been using this appropriately. 10/21/16 patient's wound appears to be doing very well on evaluation today. He still has some Slough covering the wound bed but this is nowhere near as severe as when i saw this initially two weeks ago nor even my review of his pictures last week. I'm pleased with how this is progressing. FLOYDE, DINGLEY ([id number]) Objective Constitutional Well-nourished and well-hydrated in no acute distress. Vitals Time Taken: 2:49 PM, Height: 70 in, Weight: 241 lbs, BMI: 34.6, Temperature: 98.2 F, Pulse: 114 bpm, Respiratory Rate: 18 breaths/min, Blood Pressure: 160/78 mmHg. Respiratory normal breathing without difficulty. Psychiatric this patient is able to make decisions and demonstrates good insight into disease process. Alert and Oriented x 3. pleasant and cooperative. General Notes: Patient does have slough covering the wound bed which did require sharp debridement today and he tolerated this without complication. Integumentary (  Hair, Skin) Wound #1 status is Open. Original cause of wound was Trauma. The wound is located on the Right,Posterior Wrist. The wound measures 2.3cm length x 0.9cm width x 0.1cm depth; 1.626cm^2 area and 0.163cm^3 volume. There is Fat Layer (Subcutaneous Tissue) Exposed exposed. There is no tunneling or undermining noted. There is a large amount of serous drainage noted. The wound margin is flat and intact. There is large (67-100%) pink granulation within the wound bed. There is a small (1-33%) amount of necrotic tissue within the wound bed including Eschar and Adherent Slough. The periwound skin appearance did not exhibit: Callus, Crepitus, Excoriation, Induration, Rash, Scarring, Dry/Scaly, Maceration, Atrophie Blanche, Cyanosis, Ecchymosis, Hemosiderin Staining, Mottled, Pallor, Rubor, Erythema. The periwound has tenderness on palpation. Assessment Active Problems ICD-10 E11.622 - Type 2 diabetes mellitus with other skin  ulcer S41.101A - Unspecified open wound of right upper arm, initial encounter L98.492 - Non-pressure chronic ulcer of skin of other sites with fat layer exposed OMEED, OSUNA ([id number]) Procedures Wound #1 Pre-procedure diagnosis of Wound #1 is a Trauma, Other located on the Right,Posterior Wrist . There was a Skin/Subcutaneous Tissue Debridement ([id number]) debridement with total area of 2.07 sq cm performed by [name],  E., [name]. with the following instrument(s): Curette to remove Viable and Non-Viable tissue/material including Fibrin/Slough and Subcutaneous after achieving pain control using Lidocaine 4% Topical Solution. A time out was conducted at 15:02, prior to the start of the procedure. A Minimum amount of bleeding was controlled with Pressure. The procedure was tolerated well with a pain level of 0 throughout and a pain level of 0 following the procedure. Post Debridement Measurements: 2.3cm length x 0.9cm width x 0.2cm depth; 0.325cm^3 volume. Character of Wound/Ulcer Post Debridement is improved. Post procedure Diagnosis Wound #1: Same as Pre-Procedure Plan Wound Cleansing: Wound #1 Right,Posterior Wrist: Clean wound with Normal Saline. May Shower, gently pat wound dry prior to applying new dressing. Anesthetic: Wound #1 Right,Posterior Wrist: Topical Lidocaine 4% cream applied to wound bed prior to debridement Injected 2% Lidocaine without epinephrine prior to debridement Primary Wound Dressing: Wound #1 Right,Posterior Wrist: Santyl Ointment Secondary Dressing: Wound #1 Right,Posterior Wrist: Gauze and Kerlix/Conform - secure with tape Dressing Change Frequency: Wound #1 Right,Posterior Wrist: Change dressing every day. Follow-up Appointments: Wound #1 Right,Posterior Wrist: Return Appointment in 1 week. Additional Orders / Instructions: Wound #1 Right,Posterior Wrist: Increase protein intake. Other: - Please try to keep blood sugars below 180.  Please add vitamin A, vitamin C and zinc supplements to your diet Medications-please add to medication list.: Wound #1 Right,Posterior Wrist: Santyl Enzymatic Ointment MALEKI, HIPPE ([id number]) General Notes: Patient's wound appears to be doing very well and I'm gonna recommend that we continue with the Current wound care measures for the next week. Hopefully after next week we will be able to switch to [facility] to something else going forward. Whatever I am very pleased with how this has progressed. Electronic Signature(s) Signed: 10/21/2016 5:22:22 PM By: Worthy Keeler [name] Entered By: Worthy Keeler on 10/21/2016 17:11:44 [name] ([id number]) -------------------------------------------------------------------------------- SuperBill Details Patient Name: [name] Date of Service: 10/21/2016 Medical Record Number: [id number] Patient Account Number: [id number] Date of Birth/Sex: [birth year] (71 y.o. Male) Treating RN: Montey Hora Primary Care Provider: [name] Other Clinician: Referring Provider: [name] Treating Provider/Extender: Melburn Hake,  Weeks in Treatment: 2 Diagnosis Coding ICD-10 Codes Code Description E11.622 Type 2 diabetes mellitus with other skin ulcer S41.101A Unspecified open wound of right upper arm, initial  encounter L98.492 Non-pressure chronic ulcer of skin of other sites with fat layer exposed Facility Procedures CPT4 Code Description: [id number] 11042 - DEB SUBQ TISSUE 20 SQ CM/< ICD-10 Description Diagnosis L98.492 Non-pressure chronic ulcer of skin of other sites w Modifier: ith fat layer Quantity: 1 exposed Physician Procedures CPT4 Code Description: [id number] 11042 - WC PHYS SUBQ TISS 20 SQ CM ICD-10 Description Diagnosis L98.492 Non-pressure chronic ulcer of skin of other sites wi Modifier: th fat layer Quantity: 1 exposed Electronic Signature(s) Signed: 10/21/2016 5:22:22 PM By: Worthy Keeler [name] Entered By:  Worthy Keeler on 10/21/2016 17:11:55

## 2016-10-28 ENCOUNTER — Ambulatory Visit (INDEPENDENT_AMBULATORY_CARE_PROVIDER_SITE_OTHER): Payer: Medicare Other

## 2016-10-28 ENCOUNTER — Ambulatory Visit (INDEPENDENT_AMBULATORY_CARE_PROVIDER_SITE_OTHER): Payer: Medicare Other | Admitting: Neurology

## 2016-10-28 ENCOUNTER — Encounter: Payer: Self-pay | Admitting: Neurology

## 2016-10-28 VITALS — BP 156/79 | HR 78 | Ht 71.0 in | Wt 241.5 lb

## 2016-10-28 DIAGNOSIS — R2681 Unsteadiness on feet: Secondary | ICD-10-CM | POA: Diagnosis not present

## 2016-10-28 DIAGNOSIS — E349 Endocrine disorder, unspecified: Secondary | ICD-10-CM | POA: Diagnosis not present

## 2016-10-28 DIAGNOSIS — F09 Unspecified mental disorder due to known physiological condition: Secondary | ICD-10-CM | POA: Diagnosis not present

## 2016-10-28 DIAGNOSIS — E0841 Diabetes mellitus due to underlying condition with diabetic mononeuropathy: Secondary | ICD-10-CM

## 2016-10-28 MED ORDER — TESTOSTERONE CYPIONATE 200 MG/ML IM SOLN
150.0000 mg | Freq: Once | INTRAMUSCULAR | Status: AC
  Administered 2016-10-28: 150 mg via INTRAMUSCULAR

## 2016-10-28 NOTE — Progress Notes (Signed)
PATIENT: [name] DOB: 01-10-1946  Chief Complaint  Patient presents with  . Memory Loss    MMSE 29/30 - 16 animals.  Reports no improvement in memory since starting donepezil and memantine.     HISTORICAL  [name] is a 71 years old right-handed male, seen in refer by his primary care doctor Frederico Hamman Copland for evaluation of memory loss, initial evaluation was on June 29 2016.  He had a history of hypertension, hyperlipidemia, type 2 diabetes, insulin-dependent since 2010, depression, recently Wellbutrin was added on in January for better control of his depression.  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  His mother suffered Alzheimer's disease since age 40, now lives with him, is 71 years old,  He noted to have gradual onset memory trouble around 2015, while driving, he sometimes make wrong exit, tends to repeat questions, difficulty with multitasking.  He has obstructive sleep apnea, supposed to use CPAP machine but not complying sometimes, complains of difficulty sleeping, fatigue during daytime, he baby his 38-year-old grand child, but does not exercise regularly.  I have personally reviewed MRI brain in August 2016, MRI was ordered for acute onset of Aphasia which last for 1 day, mild generalized atrophy especially at perisylvian fissure, no acute abnormality, no stroke noticed.  Review of the laboratory evaluation in 2018, elevated glucose 346, creatinine 1.5 4, LDL 46, normal CBC, hemoglobin of 14 point 6, A1c was elevated 9.7, it was up to 12 point 2 in 2016, negative hepatitis C panel,  Update October 28 2016: I reviewed laboratory evaluations, A1c 10.3, in May 2018, normal CBC, CMP showed mild elevated glucose 123, creatinine 1.69, normal vitamin B12 498, normal TSH,  We have personally reviewed CT head without contrast in June 2018, no acute abnormality, mild to moderate generalized  atrophy, small vessel disease,  He is taking and tolerating Namenda 10 mg twice a day Aricept 10 mg daily, no significant improvement noticed,   REVIEW OF SYSTEMS: Full 14 system review of systems performed and notable only for memory loss, walking difficulty, wound, back pain, dizziness, restless leg, daytime sleepiness, ringing ears  ALLERGIES: Allergies  Allergen Reactions  . Tetracycline Itching and Rash    HOME MEDICATIONS: Current Outpatient Prescriptions  Medication Sig Dispense Refill  . amoxicillin (AMOXIL) 500 MG capsule Take 500 mg by mouth daily. #30 filled 08/26/16    . aspirin EC 81 MG tablet Take 81 mg by mouth at bedtime.    [name] Kitchen atenolol (TENORMIN) 100 MG tablet TAKE ONE TABLET BY MOUTH ONCE DAILY 90 tablet 1  . buPROPion (WELLBUTRIN SR) 150 MG 12 hr tablet Take 1 tablet (150 mg total) by mouth 2 (two) times daily. 60 tablet 5  . cholecalciferol (VITAMIN D) 1000 UNITS tablet Take 1,000 Units by mouth daily.      [name] Kitchen donepezil (ARICEPT) 10 MG tablet Take 1 tablet (10 mg total) by mouth at bedtime. 30 tablet 11  . doxazosin (CARDURA) 8 MG tablet TAKE ONE TABLET BY MOUTH AT BEDTIME 90 tablet 3  . fluticasone (FLONASE) 50 MCG/ACT nasal spray INHALE 2 SPRAYS INTO EACH NOSTRIL EVERY DAY AS NEEDED (Patient taking differently: INHALE 2 SPRAYS INTO EACH NOSTRIL EVERY DAY AS NEEDED FOR CONGESTION) 16 g 11  . gabapentin (NEURONTIN) 300 MG capsule Take 1 capsule (300 mg total) by mouth 2 (two) times daily. 90 capsule 3  . ibuprofen (ADVIL,MOTRIN)  200 MG tablet Take 200-400 mg by mouth every 6 (six) hours as needed for headache (pain).    . insulin NPH Human (HUMULIN N,NOVOLIN N) 100 UNIT/ML injection Inject 30 Units into the skin 2 (two) times daily before a meal.    . insulin regular (NOVOLIN R,HUMULIN R) 100 units/mL injection Inject 30 Units into the skin 2 (two) times daily before a meal.    . lisinopril-hydrochlorothiazide (PRINZIDE,ZESTORETIC) 20-12.5 MG tablet Take 1 tablet by mouth  daily. 90 tablet 1  . memantine (NAMENDA) 10 MG tablet Take 1 tablet (10 mg total) by mouth 2 (two) times daily. 60 tablet 11  . ranitidine (ZANTAC 75) 75 MG tablet Take 225 mg by mouth at bedtime.     . sildenafil (REVATIO) 20 MG tablet Generic Revatio / Sildanefil 20 mg. 2 - 5 tabs 30 mins prior to intercourse. (Patient taking differently: Take 80-100 mg by mouth daily as needed (erectile dysfunction - 30 minutes prior to intercourse). ) 10 tablet prn  . simvastatin (ZOCOR) 40 MG tablet TAKE ONE TABLET BY MOUTH AT BEDTIME 30 tablet 11  . sulfamethoxazole-trimethoprim (BACTRIM DS,SEPTRA DS) 800-160 MG tablet Take 1 tablet by mouth 2 (two) times daily. 20 tablet 0  . testosterone cypionate (DEPOTESTOSTERONE CYPIONATE) 200 MG/ML injection INJECT 0.75MLS (150MG ) INTO THE MUSCLE (Patient taking differently: INJECT 0.75MLS (150MG ) INTO THE MUSCLE EVERY 14 DAYS AT [facility]) 10 mL 1  . venlafaxine XR (EFFEXOR-XR) 75 MG 24 hr capsule TAKE THREE (3) CAPSULES BY MOUTH DAILY (Patient taking differently: TAKE ONE CAPSULE (75 MG) BY MOUTH EVERY MORNING AND TWO CAPSULES (150 MG) AT NIGHT) 270 capsule 1   Current Facility-Administered Medications  Medication Dose Route Frequency Provider Last Rate Last Dose  . testosterone cypionate (DEPOTESTOSTERONE CYPIONATE) injection 150 mg  150 mg Intramuscular Q14 Days Copland, Spencer, MD   150 mg at 08/31/16 1557  . testosterone cypionate (DEPOTESTOSTERONE CYPIONATE) injection 150 mg  150 mg Intramuscular Q14 Days Copland, Spencer, MD   150 mg at 09/29/16 1059    PAST MEDICAL HISTORY: Past Medical History:  Diagnosis Date  . Allergic rhinitis due to pollen   . Chronic kidney disease, stage III (moderate) 12/19/2012  . Depression   . Diabetes mellitus ([place])   . Diverticulosis   . Erectile dysfunction associated with type 2 diabetes mellitus ([place]) 05/28/2015  . Former very heavy cigarette smoker (more than 40 per day) 10/07/2014  . GERD (gastroesophageal  reflux disease)   . Gout   . Headache   . Hyperlipidemia   . Hypertension   . Hypogonadism male 06/14/2012  . Iron deficiency   . Low testosterone   . Memory loss   . Osteoarthritis   . Personal history of colonic polyps    1996    PAST SURGICAL HISTORY: Past Surgical History:  Procedure Laterality Date  . CARDIAC CATHETERIZATION  11/2011   [facility]  . CHOLECYSTECTOMY  1980  . TOTAL HIP ARTHROPLASTY  2009   Dr. Gladstone Lighter  . TOTAL HIP ARTHROPLASTY      FAMILY HISTORY: Family History  Problem Relation Age of Onset  . Diabetes Mother   . Alzheimer's disease Mother   . Diabetes Father   . Lung cancer Father        Age 90  . Stroke Father   . Heart attack Father   . Lung cancer Sister        lung  . Heart disease Maternal Grandfather   . COPD Sister   .  Heart attack Sister   . Heart disease Sister     SOCIAL HISTORY:  Social History   Social History  . Marital status: Married    Spouse name: N/A  . Number of children: 4  . 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 Smoking status: Former Smoker    Packs/day: 3.00    Years: 35.00    Types: Cigarettes    Quit date: 04/12/2000  . Smokeless tobacco: Never Used  . Alcohol use No  . Drug use: No  . Sexual activity: No   Other Topics Concern  . Not on file   Social History Narrative   No regular exercise   Caffeine use: yes, dt coke   Lives at home with his wife and his mother lives with him.   Right-handed.        PHYSICAL EXAM   Vitals:   10/28/16 1210  BP: (!) 156/79  Pulse: 78  Weight: 241 lb 8 oz (109.5 kg)  Height: 5\' 11"  (1.803 m)    Not recorded      Body mass index is 33.68 kg/m.  PHYSICAL EXAMNIATION:  Gen: NAD, conversant, well nourised, obese, well groomed                     Cardiovascular: Regular rate rhythm, no peripheral edema, warm, nontender. Eyes: Conjunctivae clear without  exudates or hemorrhage Neck: Supple, no carotid bruits. Pulmonary: Clear to auscultation bilaterally   NEUROLOGICAL EXAM:  MENTAL STATUS:  MMSE - Mini Mental State Exam 10/28/2016 06/29/2016 08/26/2015  Orientation to time 5 5 5   Orientation to Place 5 5 5   Registration 3 3 3   Attention/ Calculation 5 3 0  Recall 2 2 3   Language- name 2 objects 2 2 0  Language- repeat 1 1 1   Language- follow 3 step command 3 3 3   Language- read & follow direction 1 1 0  Write a sentence 1 1 0  Copy design 1 1 0  Total score 29 27 20    Animal naming 16 CRANIAL NERVES: CN II: Visual fields are full to confrontation. Fundoscopic exam is normal with sharp discs and no vascular changes. Pupils are round equal and briskly reactive to light. CN III, IV, VI: extraocular movement are normal. No ptosis. CN V: Facial sensation is intact to pinprick in all 3 divisions bilaterally. Corneal responses are intact.  CN VII: Face is symmetric with normal eye closure and smile. CN VIII: Hearing is normal to rubbing fingers CN IX, X: Palate elevates symmetrically. Phonation is normal. CN XI: Head turning and shoulder shrug are intact CN XII: Tongue is midline with normal movements and no atrophy.  MOTOR: There is no pronator drift of out-stretched arms. Muscle bulk and tone are normal. Muscle strength is normal.  REFLEXES: Reflexes are 2+ and symmetric at the biceps, triceps, knees, and ankles. Plantar responses are flexor.  SENSORY: Mildly length dependent decreased to light touch, pinprick to ankle level COORDINATION: Rapid alternating movements and fine finger movements are intact. There is no dysmetria on finger-to-nose and heel-knee-shin.    GAIT/STANCE: Need to push up to get up from seated position, wide based mildly unsteady   DIAGNOSTIC DATA (LABS, IMAGING, TESTING) - I reviewed patient records, labs, notes, testing and imaging myself where available.   ASSESSMENT AND PLAN  [name] is a  71  y.o. 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 Ph.D.  [facility] [address] [place], [place] [address] Ph: [phone] Fax: [phone]  CC: [name], MD

## 2016-10-29 ENCOUNTER — Other Ambulatory Visit: Payer: Self-pay

## 2016-10-29 ENCOUNTER — Ambulatory Visit: Payer: Medicare Other | Admitting: Surgery

## 2016-11-04 ENCOUNTER — Encounter: Payer: Medicare Other | Admitting: Surgery

## 2016-11-04 ENCOUNTER — Encounter: Payer: Self-pay | Admitting: Family Medicine

## 2016-11-04 ENCOUNTER — Ambulatory Visit (INDEPENDENT_AMBULATORY_CARE_PROVIDER_SITE_OTHER): Payer: Medicare Other | Admitting: Family Medicine

## 2016-11-04 DIAGNOSIS — L98492 Non-pressure chronic ulcer of skin of other sites with fat layer exposed: Secondary | ICD-10-CM | POA: Diagnosis not present

## 2016-11-04 DIAGNOSIS — M62838 Other muscle spasm: Secondary | ICD-10-CM

## 2016-11-04 DIAGNOSIS — S41101A Unspecified open wound of right upper arm, initial encounter: Secondary | ICD-10-CM | POA: Diagnosis not present

## 2016-11-04 DIAGNOSIS — M109 Gout, unspecified: Secondary | ICD-10-CM | POA: Diagnosis not present

## 2016-11-04 DIAGNOSIS — I10 Essential (primary) hypertension: Secondary | ICD-10-CM | POA: Diagnosis not present

## 2016-11-04 DIAGNOSIS — Z87891 Personal history of nicotine dependence: Secondary | ICD-10-CM | POA: Diagnosis not present

## 2016-11-04 DIAGNOSIS — E11622 Type 2 diabetes mellitus with other skin ulcer: Secondary | ICD-10-CM | POA: Diagnosis not present

## 2016-11-04 NOTE — Patient Instructions (Signed)
Likely have a recurrent muscle spasms.  Continue icy hot and use a tennis ball to massage the area- trap it between you and the wall.  That should help.  Take care.  Glad to see you.

## 2016-11-04 NOTE — Progress Notes (Signed)
His R wrist is healing up, nearly healed now.  He has been seeing the wound center in [place].   He is still  Using a cane.  He cut back on gabapentin and that helped some with balance- he had fallen multiple times prior to the last time I saw him.  He still has stiffness in the upper back and neck, esp on the R side of the T spine.  Using heat and icy hot- icy hot helps more.  No bruising now.  No FCNAVD.    Meds, vitals, and allergies reviewed.   ROS: Per HPI unless specifically indicated in ROS section   nad ncat Neck supple, no stiffness, normal ROM, not ttp in midline rrr ctab R arm laceration nearly healed with distal portion still bandaged T spine not ttp in midline but muscle spasms to R of midline noted w/o bruising.

## 2016-11-05 DIAGNOSIS — M62838 Other muscle spasm: Secondary | ICD-10-CM | POA: Insufficient documentation

## 2016-11-05 NOTE — Assessment & Plan Note (Signed)
D/w pt about massage, continue icy hot.  Muscle relaxer use likely not worth the fall risk at this point.  He agrees.  Update [place] as needed.

## 2016-11-07 NOTE — Progress Notes (Signed)
SHOTA, KOHRS ([id number]) Visit Report for 11/04/2016 Arrival Information Details Patient Name: [name], [name] Date of Service: 11/04/2016 8:45 AM Medical Record Number: [id number] Patient Account Number: [id number] Date of Birth/Sex: July 30, 1945 (71 y.o. Male) Treating RN: Cornell Barman Primary Care Terrez Ander: [name] Other Clinician: Referring Hadleigh Felber: [name] Treating Tisheena Maguire/Extender: Frann Rider in Treatment: 4 Visit Information History Since Last Visit Added or deleted any medications: No Patient Arrived: Kasandra Knudsen Any new allergies or adverse reactions: No Arrival Time: 08:45 Had a fall or experienced change in No Accompanied By: self activities of daily living that may affect Transfer Assistance: None risk of falls: Patient Identification Verified: Yes Signs or symptoms of abuse/neglect since last No Secondary Verification Process Completed: Yes visito Patient Has Alerts: Yes Hospitalized since last visit: No Patient Alerts: DMII Has Dressing in Place as Prescribed: Yes Pain Present Now: No Electronic Signature(s) Signed: 11/05/2016 4:29:04 PM By: Gretta Cool, BSN, RN, CWS, Kim RN, BSN Entered By: Gretta Cool, BSN, RN, CWS, Kim on 11/04/2016 08:45:32 [name] ([id number]) -------------------------------------------------------------------------------- Encounter Discharge Information Details Patient Name: [name] Date of Service: 11/04/2016 8:45 AM Medical Record Number: [id number] Patient Account Number: [id number] Date of Birth/Sex: 1945-07-15 (70 y.o. Male) Treating RN: Carolyne Fiscal, Debi Primary Care Krzysztof Reichelt: [name] Other Clinician: Referring Jakevion Arney: [name] Treating Oriel Rumbold/Extender: Frann Rider in Treatment: 4 Encounter Discharge Information Items Discharge Pain Level: 0 Discharge Condition: Stable Ambulatory Status: Ambulatory Discharge Destination: Home Transportation: Private Auto Accompanied By:  self Schedule Follow-up Appointment: Yes Medication Reconciliation completed and provided to Patient/Care Yes Gilbert Narain: Provided on Clinical Summary of Care: 11/04/2016 Form Type Recipient Paper Patient JJ Electronic Signature(s) Signed: 11/05/2016 4:29:04 PM By: Gretta Cool, BSN, RN, CWS, Kim RN, BSN Previous Signature: 11/04/2016 9:10:28 AM Version By: Ruthine Dose Entered By: Gretta Cool BSN, RN, CWS, Kim on 11/04/2016 09:14:04 [name] ([id number]) -------------------------------------------------------------------------------- Lower Extremity Assessment Details Patient Name: [name] Date of Service: 11/04/2016 8:45 AM Medical Record Number: [id number] Patient Account Number: [id number] Date of Birth/Sex: 1946/02/06 (70 y.o. Male) Treating RN: Cornell Barman Primary Care Caine Barfield: [name] Other Clinician: Referring Jaythan Hinely: [name] Treating Laporscha Linehan/Extender: Frann Rider in Treatment: 4 Electronic Signature(s) Signed: 11/05/2016 4:29:04 PM By: Gretta Cool, BSN, RN, CWS, Kim RN, BSN Entered By: Gretta Cool, BSN, RN, CWS, Kim on 11/04/2016 08:51:28 [name] ([id number]) -------------------------------------------------------------------------------- Multi Wound Chart Details Patient Name: [name] Date of Service: 11/04/2016 8:45 AM Medical Record Number: [id number] Patient Account Number: [id number] Date of Birth/Sex: 05/23/1945 (70 y.o. Male) Treating RN: Cornell Barman Primary Care Bretton Tandy: [name] Other Clinician: Referring Arissa Fagin: [name] Treating Tandre Conly/Extender: Frann Rider in Treatment: 4 Vital Signs Height(in): 70 Pulse(bpm): 78 Weight(lbs): 241 Blood Pressure 152/69 (mmHg): Body Mass Index(BMI): 35 Temperature(F): 97.8 Respiratory Rate 16 (breaths/min): Photos: [N/A:N/A] Wound Location: Right Wrist - Posterior N/A N/A Wounding Event: Trauma N/A N/A Primary Etiology: Trauma, Other N/A  N/A Comorbid History: Hypertension, Type II N/A N/A Diabetes, Gout, Osteoarthritis, Dementia, Neuropathy Date Acquired: 09/20/2016 N/A N/A Weeks of Treatment: 4 N/A N/A Wound Status: Open N/A N/A Measurements L x W x D 1.2x0.5x0.2 N/A N/A (cm) Area (cm) : 0.471 N/A N/A Volume (cm) : 0.094 N/A N/A % Reduction in Area: 96.60% N/A N/A % Reduction in Volume: 97.70% N/A N/A Classification: Full Thickness With N/A N/A Exposed Support Structures Exudate Amount: Medium N/A N/A Exudate Type: Serous N/A N/A Exudate Color: amber N/A N/A Wound Margin: Flat and Intact N/A N/A Granulation Amount: Large (67-100%) N/A N/A  [name], [name] ([id number]) Granulation Quality: Pink N/A N/A Necrotic Amount: Small (1-33%) N/A N/A Exposed Structures: Fat Layer (Subcutaneous N/A N/A Tissue) Exposed: Yes Fascia: No Tendon: No Muscle: No Joint: No Bone: No Epithelialization: Small (1-33%) N/A N/A Debridement: Debridement ([id number]- N/A N/A 11047) Pre-procedure 09:00 N/A N/A Verification/Time Out Taken: Pain Control: Other N/A N/A Tissue Debrided: Fibrin/Slough, Skin, N/A N/A Subcutaneous Level: Skin/Subcutaneous N/A N/A Tissue Debridement Area (sq 0.6 N/A N/A cm): Instrument: Curette N/A N/A Bleeding: Minimum N/A N/A Hemostasis Achieved: Pressure N/A N/A Procedural Pain: 0 N/A N/A Post Procedural Pain: 0 N/A N/A Debridement Treatment Procedure was tolerated N/A N/A Response: well Post Debridement 1.2x0.5x0.3 N/A N/A Measurements L x W x D (cm) Post Debridement 0.141 N/A N/A Volume: (cm) Periwound Skin Texture: Scarring: Yes N/A N/A Excoriation: No Induration: No Callus: No Crepitus: No Rash: No Periwound Skin Maceration: No N/A N/A Moisture: Dry/Scaly: No Periwound Skin Color: Erythema: Yes N/A N/A Atrophie Blanche: No Cyanosis: No Ecchymosis: No Hemosiderin Staining: No Mottled: No Pallor: No Rubor: No [name], Dustin E. ([id number]) Erythema Location: Circumferential  N/A N/A Tenderness on Yes N/A N/A Palpation: Wound Preparation: Ulcer Cleansing: N/A N/A Rinsed/Irrigated with Saline Topical Anesthetic Applied: Other: lidocaine 4% Procedures Performed: Debridement N/A N/A Treatment Notes Electronic Signature(s) Signed: 11/04/2016 9:11:40 AM By: Christin Fudge MD, FACS Entered By: Christin Fudge on 11/04/2016 09:11:40 [name] ([id number]) -------------------------------------------------------------------------------- Multi-Disciplinary Care Plan Details Patient Name: [name] Date of Service: 11/04/2016 8:45 AM Medical Record Number: [id number] Patient Account Number: [id number] Date of Birth/Sex: 19-Aug-1945 (70 y.o. Male) Treating RN: Cornell Barman Primary Care Grahm Etsitty: [name] Other Clinician: Referring Thang Flett: [name] Treating Kolbee Bogusz/Extender: Frann Rider in Treatment: 4 Active Inactive ` Abuse / Safety / Falls / Self Care Management Nursing Diagnoses: Impaired physical mobility Goals: Patient will remain injury free related to falls Date Initiated: 10/04/2016 Target Resolution Date: 12/03/2016 Goal Status: Active Interventions: Assess fall risk on admission and as needed Notes: ` Orientation to the Wound Care Program Nursing Diagnoses: Knowledge deficit related to the wound healing center program Goals: Patient/caregiver will verbalize understanding of the [facility] Date Initiated: 10/04/2016 Target Resolution Date: 12/03/2016 Goal Status: Active Interventions: Provide education on orientation to the wound center Notes: ` Wound/Skin Impairment Nursing Diagnoses: Impaired tissue integrity [name], [name] ([id number]) Goals: Patient/caregiver will verbalize understanding of skin care regimen Date Initiated: 10/04/2016 Target Resolution Date: 12/03/2016 Goal Status: Active Ulcer/skin breakdown will have a volume reduction of 30% by week 4 Date Initiated:  10/04/2016 Target Resolution Date: 12/03/2016 Goal Status: Active Ulcer/skin breakdown will have a volume reduction of 50% by week 8 Date Initiated: 10/04/2016 Target Resolution Date: 12/03/2016 Goal Status: Active Ulcer/skin breakdown will have a volume reduction of 80% by week 12 Date Initiated: 10/04/2016 Target Resolution Date: 12/03/2016 Goal Status: Active Ulcer/skin breakdown will heal within 14 weeks Date Initiated: 10/04/2016 Target Resolution Date: 12/03/2016 Goal Status: Active Interventions: Assess patient/caregiver ability to obtain necessary supplies Assess patient/caregiver ability to perform ulcer/skin care regimen upon admission and as needed Assess ulceration(s) every visit Notes: Electronic Signature(s) Signed: 11/05/2016 4:29:04 PM By: Gretta Cool, BSN, RN, CWS, Kim RN, BSN Entered By: Gretta Cool, BSN, RN, CWS, Kim on 11/04/2016 09:02:49 [name] ([id number]) -------------------------------------------------------------------------------- Pain Assessment Details Patient Name: [name] Date of Service: 11/04/2016 8:45 AM Medical Record Number: [id number] Patient Account Number: [id number] Date of Birth/Sex: 1945-10-13 (70 y.o. Male) Treating RN: Cornell Barman Primary Care Shakiyla Kook: [name] Other Clinician: Referring Kemo Spruce: Lorelei Pont,  SPENCER Treating Beacher Every/Extender: Frann Rider in Treatment: 4 Active Problems Location of Pain Severity and Description of Pain Patient Has Paino No Site Locations With Dressing Change: No Pain Management and Medication Current Pain Management: Goals for Pain Management Topical or injectable lidocaine is offered to patient for acute pain when surgical debridement is performed. If needed, Patient is instructed to use over the counter pain medication for the following 24-48 hours after debridement. Wound care MDs do not prescribed pain medications. Patient has chronic pain or uncontrolled pain. Patient has been  instructed to make an appointment with their Primary Care Physician for pain management. Electronic Signature(s) Signed: 11/05/2016 4:29:04 PM By: Gretta Cool, BSN, RN, CWS, Kim RN, BSN Entered By: Gretta Cool, BSN, RN, CWS, Kim on 11/04/2016 08:45:49 [name] ([id number]) -------------------------------------------------------------------------------- Patient/Caregiver Education Details Patient Name: [name] Date of Service: 11/04/2016 8:45 AM Medical Record Number: [id number] Patient Account Number: [id number] Date of Birth/Gender: 1946/02/10 (70 y.o. Male) Treating RN: Cornell Barman Primary Care Physician: [name] Other Clinician: Referring Physician: [name] Treating Physician/Extender: Frann Rider in Treatment: 4 Education Assessment Education Provided To: Patient Education Topics Provided Wound/Skin Impairment: Handouts: Caring for Your Ulcer, Other: wound care as prescribed. Methods: Demonstration Responses: State content correctly Electronic Signature(s) Signed: 11/05/2016 4:29:04 PM By: Gretta Cool, BSN, RN, CWS, Kim RN, BSN Entered By: Gretta Cool, BSN, RN, CWS, Kim on 11/04/2016 09:14:22 [name] ([id number]) -------------------------------------------------------------------------------- Wound Assessment Details Patient Name: [name] Date of Service: 11/04/2016 8:45 AM Medical Record Number: [id number] Patient Account Number: [id number] Date of Birth/Sex: 1946-01-31 (70 y.o. Male) Treating RN: Cornell Barman Primary Care Wilborn Membreno: [name] Other Clinician: Referring Nedra Mcinnis: [name] Treating Moriyah Byington/Extender: Frann Rider in Treatment: 4 Wound Status Wound Number: 1 Primary Trauma, Other Etiology: Wound Location: Right Wrist - Posterior Wound Open Wounding Event: Trauma Status: Date Acquired: 09/20/2016 Comorbid Hypertension, Type II Diabetes, Gout, Weeks Of Treatment: 4 History: Osteoarthritis, Dementia,  Neuropathy Clustered Wound: No Photos Wound Measurements Length: (cm) 1.2 Width: (cm) 0.5 Depth: (cm) 0.2 Area: (cm) 0.471 Volume: (cm) 0.094 % Reduction in Area: 96.6% % Reduction in Volume: 97.7% Epithelialization: Small (1-33%) Tunneling: No Undermining: No Wound Description Full Thickness With Exposed Classification: Support Structures Wound Margin: Flat and Intact Exudate Medium Amount: Exudate Type: Serous Exudate Color: amber Foul Odor After Cleansing: No Slough/Fibrino Yes Wound Bed Granulation Amount: Large (67-100%) Exposed Structure Granulation Quality: Pink Fascia Exposed: No Necrotic Amount: Small (1-33%) Fat Layer (Subcutaneous Tissue) Exposed: Yes Necrotic Quality: Adherent Slough Tendon Exposed: No Muscle Exposed: No Joint Exposed: No [name], Dustin E. ([id number]) Bone Exposed: No Periwound Skin Texture Texture Color No Abnormalities Noted: No No Abnormalities Noted: No Callus: No Atrophie Blanche: No Crepitus: No Cyanosis: No Excoriation: No Ecchymosis: No Induration: No Erythema: Yes Rash: No Erythema Location: Circumferential Scarring: Yes Hemosiderin Staining: No Mottled: No Moisture Pallor: No No Abnormalities Noted: No Rubor: No Dry / Scaly: No Maceration: No Temperature / Pain Tenderness on Palpation: Yes Wound Preparation Ulcer Cleansing: Rinsed/Irrigated with Saline Topical Anesthetic Applied: Other: lidocaine 4%, Treatment Notes Wound #1 (Right, Posterior Wrist) 1. Cleansed with: Clean wound with Normal Saline 2. Anesthetic Topical Lidocaine 4% cream to wound bed prior to debridement 4. Dressing Applied: Prisma Ag Notes coverlet Electronic Signature(s) Signed: 11/05/2016 4:29:04 PM By: Gretta Cool, BSN, RN, CWS, Kim RN, BSN Entered By: Gretta Cool, BSN, RN, CWS, Kim on 11/04/2016 08:51:13 [name] ([id number]) -------------------------------------------------------------------------------- [place]  Details Patient Name: [name] Date of Service: 11/04/2016 8:45  AM Medical Record Number: [id number] Patient Account Number: [id number] Date of Birth/Sex: 04/13/1945 (71 y.o. Male) Treating RN: Cornell Barman Primary Care Lucina Betty: [name] Other Clinician: Referring Marikay Roads: [name] Treating Emauri Krygier/Extender: Frann Rider in Treatment: 4 Vital Signs Time Taken: 08:45 Temperature (F): 97.8 Height (in): 70 Pulse (bpm): 78 Weight (lbs): 241 Respiratory Rate (breaths/min): 16 Body Mass Index (BMI): 34.6 Blood Pressure (mmHg): 152/69 Reference Range: 80 - 120 mg / dl Electronic Signature(s) Signed: 11/05/2016 4:29:04 PM By: Gretta Cool, BSN, RN, CWS, Kim RN, BSN Entered By: Gretta Cool, BSN, RN, CWS, Kim on 11/04/2016 08:46:10

## 2016-11-08 NOTE — Progress Notes (Signed)
JOUD, INGWERSEN ([id number]) Visit Report for 11/04/2016 Chief Complaint Document Details Patient Name: [name], [name] Date of Service: 11/04/2016 8:45 AM Medical Record Number: [id number] Patient Account Number: [id number] Date of Birth/Sex: Aug 15, 1945 (71 y.o. Male) Treating RN: Carolyne Fiscal, Debi Primary Care Provider: [name] Other Clinician: Referring Provider: [name] Treating Provider/Extender: Frann Rider in Treatment: 4 Information Obtained from: Patient Chief Complaint Right forearm and hand traumatic ulcer Electronic Signature(s) Signed: 11/04/2016 9:11:55 AM By: Christin Fudge MD, FACS Entered By: Christin Fudge on 11/04/2016 09:11:55 [name] ([id number]) -------------------------------------------------------------------------------- Debridement Details Patient Name: [name] Date of Service: 11/04/2016 8:45 AM Medical Record Number: [id number] Patient Account Number: [id number] Date of Birth/Sex: 10/17/1945 (70 y.o. Male) Treating RN: Carolyne Fiscal, Debi Primary Care Provider: [name] Other Clinician: Referring Provider: [name] Treating Provider/Extender: Frann Rider in Treatment: 4 Debridement Performed for Wound #1 Right,Posterior Wrist Assessment: Performed By: Physician Christin Fudge, MD Debridement: Debridement Pre-procedure Verification/Time Out Yes - 09:00 Taken: Start Time: 09:01 Pain Control: Other : lidocaine 4% Level: Skin/Subcutaneous Tissue Total Area Debrided (L x 1.2 (cm) x 0.5 (cm) = 0.6 (cm) W): Tissue and other Viable, Fibrin/Slough, Skin, Subcutaneous material debrided: Instrument: Curette Bleeding: Minimum Hemostasis Achieved: Pressure End Time: 09:03 Procedural Pain: 0 Post Procedural Pain: 0 Response to Treatment: Procedure was tolerated well Post Debridement Measurements of Total Wound Length: (cm) 1.2 Width: (cm) 0.5 Depth: (cm) 0.3 Volume: (cm)  0.141 Character of Wound/Ulcer Post Requires Further Debridement Debridement: Post Procedure Diagnosis Same as Pre-procedure Electronic Signature(s) Signed: 11/04/2016 9:11:49 AM By: Christin Fudge MD, FACS Signed: 11/04/2016 4:39:46 PM By: Alric Quan Entered By: Christin Fudge on 11/04/2016 09:11:48 [name] ([id number]) -------------------------------------------------------------------------------- HPI Details Patient Name: [name] Date of Service: 11/04/2016 8:45 AM Medical Record Number: [id number] Patient Account Number: [id number] Date of Birth/Sex: [birth year] (71 y.o. Male) Treating RN: Carolyne Fiscal, Debi Primary Care Provider: [name] Other Clinician: Referring Provider: [name] Treating Provider/Extender: Frann Rider in Treatment: 4 History of Present Illness Location: Right forearm and hand Quality: Patient states that he has a mild sore sensation Severity: 1/10 Duration: For approximately 2 weeks prior to initial evaluation in the wound center Timing: With cleansing of the wound or pressure in general Context: Patientos wound was initiated by him falling into glass causing multiple lacerations though the hand laceration is the worst. Modifying Factors: Patient has recently been on antibiotics for this wound and was soaking it in peroxide for a time. Most recently antibiotic cream has been used Associated Signs and Symptoms: Uncontrolled DM Type II HPI Description: 10/04/16 on evaluation today patient presents for initial evaluation concerning a laceration of the right wrist and hand on the bowler aspect. He tells me that he fell into glass following a medication change in regard to his diabetes. He initially went to [facility] where she was sutured and subsequently his daughter who is a Marine scientist removed the teachers at the appropriate time. Unfortunately the wound has done poorly and dehissed which has caused him some trouble  including infection. He has been on antibiotics both topically and orally though the wound continues to show signs of necrotic tissue according to notes which is what he was sent to [place] for evaluation concerning. He does have diabetes and unfortunately this is severely uncontrolled. He does see an endocrinologist and is working on this. 10/11/2016 -- the patient's notes from the ER were reviewed and he had his suturing done on 09/17/2016 and had necrotic  skin flaps which had to be trimmed the last visit he was here. He has managed to get his Santyl ointment and has been using this appropriately. 10/21/16 patient's wound appears to be doing very well on evaluation today. He still has some Slough covering the wound bed but this is nowhere near as severe as when i saw this initially two weeks ago nor even my review of his pictures last week. I'm pleased with how this is progressing. Electronic Signature(s) Signed: 11/04/2016 9:12:04 AM By: Christin Fudge MD, FACS Entered By: Christin Fudge on 11/04/2016 09:12:03 [name] ([id number]) -------------------------------------------------------------------------------- Physical Exam Details Patient Name: [name] Date of Service: 11/04/2016 8:45 AM Medical Record Number: [id number] Patient Account Number: [id number] Date of Birth/Sex: 03-26-1946 (70 y.o. Male) Treating RN: Carolyne Fiscal, Debi Primary Care Provider: [name] Other Clinician: Referring Provider: [name] Treating Provider/Extender: Frann Rider in Treatment: 4 Constitutional . Pulse regular. Respirations normal and unlabored. Afebrile. . Eyes Nonicteric. Reactive to light. Ears, Nose, Mouth, and Throat Lips, teeth, and gums WNL.[name] Kitchen Moist mucosa without lesions. Neck supple and nontender. No palpable supraclavicular or cervical adenopathy. Normal sized without goiter. Respiratory WNL. No retractions.. Breath sounds WNL, No rubs, rales, rhonchi, or  wheeze.. Cardiovascular Heart rhythm and rate regular, no murmur or gallop.. Pedal Pulses WNL. No clubbing, cyanosis or edema. Chest Breasts symmetical and no nipple discharge.. Breast tissue WNL, no masses, lumps, or tenderness.. Lymphatic No adneopathy. No adenopathy. No adenopathy. Musculoskeletal Adexa without tenderness or enlargement.. Digits and nails w/o clubbing, cyanosis, infection, petechiae, ischemia, or inflammatory conditions.. Integumentary (Hair, Skin) No suspicious lesions. No crepitus or fluctuance. No peri-wound warmth or erythema. No masses.[name] Kitchen Psychiatric Judgement and insight Intact.. No evidence of depression, anxiety, or agitation.. Notes sharp debridement was done with a #3 curet and the edges of the wound and the base was nice and clean after removing all the debris. Minimal bleeding controlled with pressure Electronic Signature(s) Signed: 11/04/2016 9:12:31 AM By: Christin Fudge MD, FACS Entered By: Christin Fudge on 11/04/2016 09:12:31 [name] ([id number]) -------------------------------------------------------------------------------- Physician Orders Details Patient Name: [name] Date of Service: 11/04/2016 8:45 AM Medical Record Number: [id number] Patient Account Number: [id number] Date of Birth/Sex: November 14, 1945 (70 y.o. Male) Treating RN: Cornell Barman Primary Care Provider: [name] Other Clinician: Referring Provider: [name] Treating Provider/Extender: Frann Rider in Treatment: 4 Verbal / Phone Orders: No Diagnosis Coding Wound Cleansing Wound #1 Right,Posterior Wrist o Clean wound with Normal Saline. Anesthetic Wound #1 Right,Posterior Wrist o Topical Lidocaine 4% cream applied to wound bed prior to debridement Primary Wound Dressing Wound #1 Right,Posterior Wrist o Prisma Ag Secondary Dressing Wound #1 Right,Posterior Wrist o Other - coverlet Dressing Change Frequency Wound #1 Right,Posterior  Wrist o Change dressing every other day. Follow-up Appointments Wound #1 Right,Posterior Wrist o Return Appointment in 1 week. Electronic Signature(s) Signed: 11/04/2016 3:53:48 PM By: Christin Fudge MD, FACS Signed: 11/05/2016 4:29:04 PM By: Gretta Cool BSN, RN, CWS, Kim RN, BSN Entered By: Gretta Cool, BSN, RN, CWS, Kim on 11/04/2016 09:09:03 [name] ([id number]) -------------------------------------------------------------------------------- Problem List Details Patient Name: [name] Date of Service: 11/04/2016 8:45 AM Medical Record Number: [id number] Patient Account Number: [id number] Date of Birth/Sex: 07/28/1945 (70 y.o. 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 Male) Treating RN: Carolyne Fiscal, Debi Primary Care Provider: [name] Other Clinician: Referring Provider: [name] Treating Provider/Extender: Frann Rider in Treatment: 4 Subjective Chief Complaint Information obtained from Patient Right forearm  and hand traumatic ulcer History of Present Illness (HPI) The following HPI elements were documented for the patient's wound: Location: Right forearm and hand Quality: Patient states that he has a mild sore sensation Severity: 1/10 Duration: For approximately 2 weeks prior to initial evaluation in the wound center Timing: With cleansing of the wound or pressure in general Context: Patient s wound was initiated by him falling into glass causing multiple lacerations though the hand laceration is the worst. Modifying Factors: Patient has recently been on antibiotics for this wound and was soaking it in peroxide for a time. Most recently antibiotic cream has been used Associated Signs and Symptoms: Uncontrolled DM Type II 10/04/16 on evaluation today patient presents for initial evaluation concerning a laceration of the right wrist and hand on the bowler aspect. He tells me that he fell into glass following a medication change in regard to his diabetes. He initially went to [facility] where she was sutured and subsequently his daughter who is a Marine scientist removed the teachers at the appropriate time. Unfortunately the wound has done poorly and dehissed which has caused him some trouble including infection. He has been on antibiotics both topically and orally though the wound continues to show signs of necrotic tissue according to notes which is what he was sent to [place] for evaluation concerning. He does have diabetes and unfortunately this is severely uncontrolled. He does see an endocrinologist and is working on this. 10/11/2016 -- the patient's notes from the ER were reviewed and he had his suturing done on 09/17/2016 and had necrotic skin flaps which had to be trimmed the last visit he was here. He has managed to get his Santyl ointment and has been using this appropriately. 10/21/16 patient's wound appears to be doing very well on evaluation today. He still has some Slough covering the wound bed  but this is nowhere near as severe as when i saw this initially two weeks ago nor even my review of his pictures last week. I'm pleased with how this is progressing. ALEKS, NAWROT ([id number]) Objective Constitutional Pulse regular. Respirations normal and unlabored. Afebrile. Vitals Time Taken: 8:45 AM, Height: 70 in, Weight: 241 lbs, BMI: 34.6, Temperature: 97.8 F, Pulse: 78 bpm, Respiratory Rate: 16 breaths/min, Blood Pressure: 152/69 mmHg. Eyes Nonicteric. Reactive to light. Ears, Nose, Mouth, and Throat Lips, teeth, and gums WNL.[name] Kitchen Moist mucosa without lesions. Neck supple and nontender. No palpable supraclavicular or cervical adenopathy. Normal sized without goiter. Respiratory WNL. No retractions.. Breath sounds WNL, No rubs, rales, rhonchi, or wheeze.. Cardiovascular Heart rhythm and rate regular, no murmur or gallop.. Pedal Pulses WNL. No clubbing, cyanosis or edema. Chest Breasts symmetical and no nipple discharge.. Breast tissue WNL, no masses, lumps, or tenderness.. Lymphatic No adneopathy. No adenopathy. No adenopathy. Musculoskeletal Adexa without tenderness or enlargement.. Digits and nails  w/o clubbing, cyanosis, infection, petechiae, ischemia, or inflammatory conditions.[name] Kitchen Psychiatric Judgement and insight Intact.. No evidence of depression, anxiety, or agitation.. General Notes: sharp debridement was done with a #3 curet and the edges of the wound and the base was nice and clean after removing all the debris. Minimal bleeding controlled with pressure Integumentary (Hair, Skin) No suspicious lesions. No crepitus or fluctuance. No peri-wound warmth or erythema. No masses.. Wound #1 status is Open. Original cause of wound was Trauma. The wound is located on the Right,Posterior Wrist. The wound measures 1.2cm length x 0.5cm width x 0.2cm depth; 0.471cm^2 area and 0.094cm^3 volume. There is Fat Layer (Subcutaneous Tissue) Exposed exposed. There is no  tunneling HONOR, [employer]. ([id number]) or undermining noted. There is a medium amount of serous drainage noted. The wound margin is flat and intact. There is large (67-100%) pink granulation within the wound bed. There is a small (1-33%) amount of necrotic tissue within the wound bed including Adherent Slough. The periwound skin appearance exhibited: Scarring, Erythema. The periwound skin appearance did not exhibit: Callus, Crepitus, Excoriation, Induration, Rash, Dry/Scaly, Maceration, Atrophie Blanche, Cyanosis, Ecchymosis, Hemosiderin Staining, Mottled, Pallor, Rubor. The surrounding wound skin color is noted with erythema which is circumferential. The periwound has tenderness on palpation. 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 There was a Skin/Subcutaneous Tissue Debridement ([id number]) debridement with total area of 0.6 sq cm performed by Christin Fudge, MD. with the following instrument(s): Curette to remove Viable tissue/material including Fibrin/Slough, Skin, and Subcutaneous after achieving pain control using Other (lidocaine 4%). A time out was conducted at 09:00, prior to the start of the procedure. A Minimum amount of bleeding was controlled with Pressure. The procedure was tolerated well with a pain level of 0 throughout and a pain level of 0 following the procedure. Post Debridement Measurements: 1.2cm length x 0.5cm width x 0.3cm depth; 0.141cm^3 volume. Character of Wound/Ulcer Post Debridement requires further debridement. Post procedure Diagnosis Wound #1: Same as Pre-Procedure Plan Wound Cleansing: Wound #1 Right,Posterior Wrist: Clean wound with  Normal Saline. SAMI, FROH ([id number]) Anesthetic: Wound #1 Right,Posterior Wrist: Topical Lidocaine 4% cream applied to wound bed prior to debridement Primary Wound Dressing: Wound #1 Right,Posterior Wrist: Prisma Ag Secondary Dressing: Wound #1 Right,Posterior Wrist: Other - coverlet Dressing Change Frequency: Wound #1 Right,Posterior Wrist: Change dressing every other day. Follow-up Appointments: Wound #1 Right,Posterior Wrist: Return Appointment in 1 week. after review of the wound and sharp debridement today I have recommended: 1. Prisma AG and a bordered foam to be changed every other day. 2. Adequate protein, vitamin A, vitamin C and zinc 3. regular visits to the wound center Electronic Signature(s) Signed: 11/04/2016 9:13:48 AM By: Christin Fudge MD, FACS Entered By: Christin Fudge on 11/04/2016 09:13:48 [name] ([id number]) -------------------------------------------------------------------------------- SuperBill Details Patient Name: [name] Date of Service: 11/04/2016 Medical Record Number: [id number] Patient Account Number: [id number] Date of Birth/Sex: 18-Mar-1946 (70 y.o. Male) Treating RN: Carolyne Fiscal, Debi Primary Care Provider: [name] Other Clinician: Referring Provider: [name] Treating Provider/Extender: Frann Rider in Treatment: 4 Diagnosis Coding ICD-10 Codes Code Description E11.622 Type 2 diabetes mellitus with other skin ulcer S41.101A Unspecified open wound of right upper arm, initial encounter  Z61.096 Non-pressure chronic ulcer of skin of other sites with fat layer exposed Facility Procedures CPT4 Code Description: [id number] 11042 - DEB SUBQ TISSUE 20 SQ CM/< ICD-10 Description Diagnosis E11.622 Type 2 diabetes mellitus with other skin ulcer S41.101A Unspecified open wound of right upper arm, initial L98.492 Non-pressure chronic ulcer of skin  of other sites w Modifier: encounter ith fat layer Quantity:  1 exposed Physician Procedures CPT4 Code Description: 1914782 11042 - WC PHYS SUBQ TISS 20 SQ CM ICD-10 Description Diagnosis E11.622 Type 2 diabetes mellitus with other skin ulcer S41.101A Unspecified open wound of right upper arm, initial e L98.492 Non-pressure chronic ulcer of skin  of other sites wi Modifier: ncounter th fat layer Quantity: 1 exposed Electronic Signature(s) Signed: 11/04/2016 9:14:09 AM By: Christin Fudge MD, FACS Entered By: Christin Fudge on 11/04/2016 09:14:09

## 2016-11-09 NOTE — Progress Notes (Signed)
I was available for consultation, and agree with documentation and plan.  Signed,  Maud Deed. Jezreel Justiniano, MD

## 2016-11-11 ENCOUNTER — Encounter: Payer: Medicare Other | Attending: Surgery | Admitting: Surgery

## 2016-11-11 ENCOUNTER — Ambulatory Visit (INDEPENDENT_AMBULATORY_CARE_PROVIDER_SITE_OTHER): Payer: Medicare Other

## 2016-11-11 DIAGNOSIS — E1140 Type 2 diabetes mellitus with diabetic neuropathy, unspecified: Secondary | ICD-10-CM | POA: Diagnosis not present

## 2016-11-11 DIAGNOSIS — E349 Endocrine disorder, unspecified: Secondary | ICD-10-CM

## 2016-11-11 DIAGNOSIS — X58XXXA Exposure to other specified factors, initial encounter: Secondary | ICD-10-CM | POA: Diagnosis not present

## 2016-11-11 DIAGNOSIS — F0390 Unspecified dementia without behavioral disturbance: Secondary | ICD-10-CM | POA: Insufficient documentation

## 2016-11-11 DIAGNOSIS — E11622 Type 2 diabetes mellitus with other skin ulcer: Secondary | ICD-10-CM | POA: Insufficient documentation

## 2016-11-11 DIAGNOSIS — S41101A Unspecified open wound of right upper arm, initial encounter: Secondary | ICD-10-CM | POA: Insufficient documentation

## 2016-11-11 DIAGNOSIS — L98492 Non-pressure chronic ulcer of skin of other sites with fat layer exposed: Secondary | ICD-10-CM | POA: Insufficient documentation

## 2016-11-11 MED ORDER — TESTOSTERONE CYPIONATE 200 MG/ML IM SOLN
150.0000 mg | Freq: Once | INTRAMUSCULAR | Status: AC
  Administered 2016-11-11: 150 mg via INTRAMUSCULAR

## 2016-11-11 NOTE — Progress Notes (Signed)
I reviewed note, was available for consultation, and agree with documentation and plan.  Signed,  Maud Deed. Giavanni Zeitlin, MD

## 2016-11-13 NOTE — Progress Notes (Signed)
CAYDN, JUSTEN ([id number]) Visit Report for 11/11/2016 Chief Complaint Document Details Patient Name: [name], [name] Date of Service: 11/11/2016 9:45 AM Medical Record Number: [id number] Patient Account Number: [id number] Date of Birth/Sex: 1946/02/10 (71 y.o. Male) Treating RN: Ahmed Prima Primary Care Provider: [name] Other Clinician: Referring Provider: [name] Treating Provider/Extender: Frann Rider in Treatment: 5 Information Obtained from: Patient Chief Complaint Right forearm and hand traumatic ulcer Electronic Signature(s) Signed: 11/11/2016 10:06:48 AM By: Christin Fudge MD, FACS Entered By: Christin Fudge on 11/11/2016 10:06:48 [name] ([id number]) -------------------------------------------------------------------------------- Debridement Details Patient Name: [name] Date of Service: 11/11/2016 9:45 AM Medical Record Number: [id number] Patient Account Number: [id number] Date of Birth/Sex: 1945-06-08 (71 y.o. Male) Treating RN: Ahmed Prima Primary Care Provider: [name] Other Clinician: Referring Provider: [name] Treating Provider/Extender: Frann Rider in Treatment: 5 Debridement Performed for Wound #1 Right,Posterior Wrist Assessment: Performed By: Physician Christin Fudge, MD Debridement: Debridement Pre-procedure Verification/Time Out Yes - 09:56 Taken: Start Time: 09:57 Pain Control: Lidocaine 4% Topical Solution Level: Skin/Subcutaneous Tissue Total Area Debrided (L x 0.3 (cm) x 0.1 (cm) = 0.03 (cm) W): Tissue and other Viable, Non-Viable, Exudate, Fibrin/Slough, Subcutaneous material debrided: Instrument: Curette Bleeding: Minimum Hemostasis Achieved: Pressure End Time: 09:59 Procedural Pain: 0 Post Procedural Pain: 0 Response to Treatment: Procedure was tolerated well Post Debridement Measurements of Total Wound Length: (cm) 0.3 Width: (cm) 0.2 Depth: (cm)  0.1 Volume: (cm) 0.005 Character of Wound/Ulcer Post Requires Further Debridement Debridement: Post Procedure Diagnosis Same as Pre-procedure Electronic Signature(s) Signed: 11/11/2016 10:06:43 AM By: Christin Fudge MD, FACS Signed: 11/11/2016 4:42:53 PM By: Alric Quan Previous Signature: 11/11/2016 10:06:33 AM Version By: Christin Fudge MD, FACS Previous Signature: 11/11/2016 10:06:19 AM Version By: Christin Fudge MD, FACS Entered By: Christin Fudge on 11/11/2016 10:06:42 [name] ([name] ([id number]) -------------------------------------------------------------------------------- HPI Details Patient Name: [name] Date of Service: 11/11/2016 9:45 AM Medical Record Number: [id number] Patient Account Number: [id number] Date of Birth/Sex: 1945/05/10 (71 y.o. Male) Treating RN: Carolyne Fiscal, Debi Primary Care Provider: [name] Other Clinician: Referring Provider: [name] Treating Provider/Extender: Frann Rider in Treatment: 5 History of Present Illness Location: Right forearm and hand Quality: Patient states that he has a mild sore sensation Severity: 1/10 Duration: For approximately 2 weeks prior to initial evaluation in the wound center Timing: With cleansing of the wound or pressure in general Context: Patientos wound was initiated by him falling into glass causing multiple lacerations though the hand laceration is the worst. Modifying Factors: Patient has recently been on antibiotics for this wound and was soaking it in peroxide for a time. Most recently antibiotic cream has been used Associated Signs and Symptoms: Uncontrolled DM Type II HPI Description: 10/04/16 on evaluation today patient presents for initial evaluation concerning a laceration of the right wrist and hand on the bowler aspect. He tells me that he fell into glass following a medication change in regard to his diabetes. He initially went to [facility]  where she was sutured and subsequently his daughter who is a Marine scientist removed the teachers at the appropriate time. Unfortunately the wound has done poorly and dehissed which has caused him some trouble including infection. He has been on antibiotics both topically and orally though the wound continues to show signs of necrotic tissue according to notes which is what he was sent to [place] for evaluation concerning. He does have diabetes and unfortunately this is severely uncontrolled. He does see an  endocrinologist and is working on this. 10/11/2016 -- the patient's notes from the ER were reviewed and he had his suturing done on 09/17/2016 and had necrotic skin flaps which had to be trimmed the last visit he was here. He has managed to get his Santyl ointment and has been using this appropriately. 10/21/16 patient's wound appears to be doing very well on evaluation today. He still has some Slough covering the wound bed but this is nowhere near as severe as when i saw this initially two weeks ago nor even my review of his pictures last week. I'm pleased with how this is progressing. Electronic Signature(s) Signed: 11/11/2016 10:06:54 AM By: Christin Fudge MD, FACS Entered By: Christin Fudge on 11/11/2016 10:06:54 [name] ([id number]) -------------------------------------------------------------------------------- Physical Exam Details Patient Name: [name] Date of Service: 11/11/2016 9:45 AM Medical Record Number: [id number] Patient Account Number: [id number] Date of Birth/Sex: Dec 07, 1945 (71 y.o. Male) Treating RN: Ahmed Prima Primary Care Provider: [name] Other Clinician: Referring Provider: [name] Treating Provider/Extender: Frann Rider in Treatment: 5 Constitutional . Pulse regular. Respirations normal and unlabored. Afebrile. . Eyes Nonicteric. Reactive to light. Ears, Nose, Mouth, and Throat Lips, teeth, and gums WNL.[name] Kitchen Moist mucosa without  lesions. Neck supple and nontender. No palpable supraclavicular or cervical adenopathy. Normal sized without goiter. Respiratory WNL. No retractions.. Cardiovascular Pedal Pulses WNL. No clubbing, cyanosis or edema. Lymphatic No adneopathy. No adenopathy. No adenopathy. Musculoskeletal Adexa without tenderness or enlargement.. Digits and nails w/o clubbing, cyanosis, infection, petechiae, ischemia, or inflammatory conditions.. Integumentary (Hair, Skin) No suspicious lesions. No crepitus or fluctuance. No peri-wound warmth or erythema. No masses.[name] Kitchen Psychiatric Judgement and insight Intact.. No evidence of depression, anxiety, or agitation.. Notes the wound has some eschar and slough at the base and after sharply removing all this along the length of the wound the small area which is open is looking very clean and I have washed it out well with moist saline gauze and controlled the bleeding with pressure Electronic Signature(s) Signed: 11/11/2016 10:07:32 AM By: Christin Fudge MD, FACS Entered By: Christin Fudge on 11/11/2016 10:07:31 [name] ([id number]) -------------------------------------------------------------------------------- Physician Orders Details Patient Name: [name] Date of Service: 11/11/2016 9:45 AM Medical Record Number: [id number] Patient Account Number: [id number] Date of Birth/Sex: December 28, 1945 (71 y.o. Male) Treating RN: Ahmed Prima Primary Care Provider: [name] Other Clinician: Referring Provider: [name] Treating Provider/Extender: Frann Rider in Treatment: 5 Verbal / Phone Orders: Yes ClinicianCarolyne Fiscal, Debi Read Back and Verified: Yes Diagnosis Coding Wound Cleansing Wound #1 Right,Posterior Wrist o Clean wound with Normal Saline. Anesthetic Wound #1 Right,Posterior Wrist o Topical Lidocaine 4% cream applied to wound bed prior to debridement Primary Wound Dressing Wound #1 Right,Posterior Wrist o  Prisma Ag Secondary Dressing Wound #1 Right,Posterior Wrist o Other - coverlet Dressing Change Frequency Wound #1 Right,Posterior Wrist o Change dressing every other day. Follow-up Appointments Wound #1 Right,Posterior Wrist o Return Appointment in 1 week. Electronic Signature(s) Signed: 11/11/2016 4:24:31 PM By: Christin Fudge MD, FACS Signed: 11/11/2016 4:42:53 PM By: Alric Quan Entered By: Alric Quan on 11/11/2016 09:59:28 [name] ([id number]) -------------------------------------------------------------------------------- Problem List Details Patient Name: [name] Date of Service: 11/11/2016 9:45 AM Medical Record Number: [id number] Patient Account Number: [id number] Date of Birth/Sex: Feb 14, 1946 (71 y.o. 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 Male) Treating RN: Carolyne Fiscal, Debi Primary Care Provider: [name] Other Clinician: Referring Provider: [name] Treating Provider/Extender: Frann Rider in Treatment: 5 Subjective Chief Complaint Information obtained from Patient Right forearm and hand traumatic ulcer History of Present Illness (HPI) The following HPI elements were documented for the patient's wound: Location: Right forearm and hand Quality: Patient states that he has a mild sore sensation Severity: 1/10 Duration: For approximately 2 weeks prior to initial evaluation in the wound center Timing: With cleansing of the wound or pressure in general Context: Patient s wound was initiated by him falling into glass causing multiple lacerations though the hand laceration is the worst. Modifying Factors: Patient has recently been on antibiotics for this wound and was soaking it in peroxide for a time. Most recently antibiotic cream has been used Associated Signs and Symptoms: Uncontrolled DM Type II 10/04/16 on evaluation today patient presents for initial evaluation concerning a laceration of the right wrist and hand on the bowler aspect. He tells me that he fell into glass following a medication change in regard to his diabetes. He initially went to [facility] where she was sutured and subsequently his daughter who is a Marine scientist removed the teachers at the appropriate time. Unfortunately the wound has done poorly and dehissed which has caused him some trouble including infection. He has been on antibiotics both topically and orally though the wound continues to show signs of necrotic tissue according to notes which is what he was sent to [place] for evaluation concerning. He does have diabetes and unfortunately this is severely uncontrolled. He does see an endocrinologist and is working on this. 10/11/2016 -- the patient's notes from the ER were reviewed and he had his suturing done on 09/17/2016 and had necrotic skin flaps which had to be trimmed the last visit he was here. He has managed to get his Santyl ointment and has been using this appropriately. 10/21/16  patient's wound appears to be doing very well on evaluation today. He still has some Slough covering the wound bed but this is nowhere near as severe as when i saw this initially two weeks ago nor even my review of his pictures last week. I'm pleased with how this is progressing. CURRY, SEEFELDT ([id number]) Objective Constitutional Pulse regular. Respirations normal and unlabored. Afebrile. Vitals Time Taken: 9:52 AM, Height: 70 in, Weight: 241 lbs, BMI: 34.6, Temperature: 97.7 F, Pulse: 75 bpm, Respiratory Rate: 16 breaths/min, Blood Pressure: 157/63 mmHg. Eyes Nonicteric. Reactive to light. Ears, Nose, Mouth, and Throat Lips, teeth, and gums WNL.[name] Kitchen Moist mucosa without lesions. Neck supple and nontender. No palpable supraclavicular or cervical adenopathy. Normal sized without goiter. Respiratory WNL. No retractions.. Cardiovascular Pedal Pulses WNL. No clubbing, cyanosis or edema. Lymphatic No adneopathy. No adenopathy. No adenopathy. Musculoskeletal Adexa without tenderness or enlargement.. Digits and nails w/o clubbing, cyanosis, infection, petechiae, ischemia, or inflammatory conditions.[name] Kitchen Psychiatric Judgement and insight Intact.. No evidence of depression, anxiety, or agitation.[name] Kitchen  General Notes: the wound has some eschar and slough at the base and after sharply removing all this along the length of the wound the small area which is open is looking very clean and I have washed it out well with moist saline gauze and controlled the bleeding with pressure Integumentary (Hair, Skin) No suspicious lesions. No crepitus or fluctuance. No peri-wound warmth or erythema. No masses.. Wound #1 status is Open. Original cause of wound was Trauma. The wound is located on the Right,Posterior Wrist. The wound measures 0.3cm length x 0.1cm width x 0.1cm depth; 0.024cm^2 area and 0.002cm^3 volume. There is Fat Layer (Subcutaneous Tissue) Exposed exposed. There is no tunneling or undermining  noted. There is a medium amount of serous drainage noted. The wound margin is flat and intact. There is large (67-100%) pink granulation within the wound bed. There is a small (1-33%) amount of ROURKE, MCQUITTY E. ([id number]) necrotic tissue within the wound bed including Adherent Slough. The periwound skin appearance exhibited: Scarring, Erythema. The periwound skin appearance did not exhibit: Callus, Crepitus, Excoriation, Induration, Rash, Dry/Scaly, Maceration, Atrophie Blanche, Cyanosis, Ecchymosis, Hemosiderin Staining, Mottled, Pallor, Rubor. The surrounding wound skin color is noted with erythema which is circumferential. The periwound has tenderness on palpation. 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 There was a Skin/Subcutaneous Tissue Debridement ([id number]) debridement with total area of 0.03 sq cm performed by Christin Fudge, MD. with the following instrument(s): Curette to remove Viable and Non-Viable tissue/material including Exudate, Fibrin/Slough, and Subcutaneous after achieving pain control using Lidocaine 4% Topical Solution. A time out was conducted at 09:56, prior to the start of the procedure. A Minimum amount of bleeding was controlled with Pressure. The procedure was tolerated well with a pain level of 0 throughout and a pain level of 0 following the procedure. Post Debridement Measurements: 0.3cm length x 0.2cm width x 0.1cm depth; 0.005cm^3 volume. Character of Wound/Ulcer Post Debridement requires further debridement. Post procedure Diagnosis Wound #1: Same as Pre-Procedure Plan Wound Cleansing: Wound #1 Right,Posterior Wrist: Clean wound with  Normal Saline. Anesthetic: Wound #1 Right,Posterior Wrist: ULICES, MAACK ([id number]) Topical Lidocaine 4% cream applied to wound bed prior to debridement Primary Wound Dressing: Wound #1 Right,Posterior Wrist: Prisma Ag Secondary Dressing: Wound #1 Right,Posterior Wrist: Other - coverlet Dressing Change Frequency: Wound #1 Right,Posterior Wrist: Change dressing every other day. Follow-up Appointments: Wound #1 Right,Posterior Wrist: Return Appointment in 1 week. The wound is looking very good overall and after review of the wound and sharp debridement today I have recommended: 1. Prisma AG and a bordered foam to be changed every other day. 2. Adequate protein, vitamin A, vitamin C and zinc 3. regular visits to the wound center Electronic Signature(s) Signed: 11/11/2016 10:08:57 AM By: Christin Fudge MD, FACS Entered By: Christin Fudge on 11/11/2016 10:08:56 [name] ([id number]) -------------------------------------------------------------------------------- SuperBill Details Patient Name: [name] Date of Service: 11/11/2016 Medical Record Number: [id number] Patient Account Number: [id number] Date of Birth/Sex: [birth year] (71 y.o. Male) Treating RN: Ahmed Prima Primary Care Provider: [name] Other Clinician: Referring Provider: [name] Treating Provider/Extender: Frann Rider in Treatment: 5 Diagnosis Coding ICD-10 Codes Code Description E11.622 Type 2 diabetes mellitus with other skin ulcer S41.101A Unspecified  open wound of right upper arm, initial encounter L98.492 Non-pressure chronic ulcer of skin of other sites with fat layer exposed Facility Procedures CPT4 Code Description: [id number] 11042 - DEB SUBQ TISSUE 20 SQ CM/< ICD-10 Description Diagnosis E11.622 Type 2 diabetes mellitus with other skin ulcer S41.101A Unspecified open wound of right upper arm, initial L98.492 Non-pressure chronic ulcer of skin  of other sites  w Modifier: encounter ith fat layer Quantity: 1 exposed Physician Procedures CPT4 Code Description: 2122482 11042 - WC PHYS SUBQ TISS 20 SQ CM ICD-10 Description Diagnosis E11.622 Type 2 diabetes mellitus with other skin ulcer S41.101A Unspecified open wound of right upper arm, initial e L98.492 Non-pressure chronic ulcer of skin  of other sites wi Modifier: ncounter th fat layer Quantity: 1 exposed Electronic Signature(s) Signed: 11/11/2016 10:09:07 AM By: Christin Fudge MD, FACS Entered By: Christin Fudge on 11/11/2016 10:09:07

## 2016-11-13 NOTE — Progress Notes (Signed)
MEHKI, KLUMPP ([id number]) Visit Report for 11/11/2016 Arrival Information Details Patient Name: [name], [name] Date of Service: 11/11/2016 9:45 AM Medical Record Number: [id number] Patient Account Number: [id number] Date of Birth/Sex: [birth year] (71 y.o. Male) Treating RN: Ahmed Prima Primary Care Hildy Nicholl: [name] Other Clinician: Referring Birdell Frasier: [name] Treating Charlese Gruetzmacher/Extender: Frann Rider in Treatment: 5 Visit Information History Since Last Visit Added or deleted any medications: No Patient Arrived: Ambulatory Any new allergies or adverse reactions: No Arrival Time: 09:51 Had a fall or experienced change in No Accompanied By: self activities of daily living that may affect Transfer Assistance: None risk of falls: Patient Identification Verified: Yes Signs or symptoms of abuse/neglect since last No Secondary Verification Process Yes visito Completed: Hospitalized since last visit: No Patient Has Alerts: Yes Has Dressing in Place as Prescribed: Yes Patient Alerts: DMII Pain Present Now: No Electronic Signature(s) Signed: 11/11/2016 4:42:53 PM By: Alric Quan Entered By: Alric Quan on 11/11/2016 09:51:50 [name] ([id number]) -------------------------------------------------------------------------------- Encounter Discharge Information Details Patient Name: [name] Date of Service: 11/11/2016 9:45 AM Medical Record Number: [id number] Patient Account Number: [id number] Date of Birth/Sex: May 22, 1945 (71 y.o. Male) Treating RN: Ahmed Prima Primary Care Marcus Groll: [name] Other Clinician: Referring Dail Lerew: [name] Treating Kaegan Stigler/Extender: Frann Rider in Treatment: 5 Encounter Discharge Information Items Discharge Pain Level: 0 Discharge Condition: Stable Ambulatory Status: Ambulatory Discharge Destination: Home Transportation: Private Auto Accompanied By: self Schedule  Follow-up Appointment: Yes Medication Reconciliation completed and provided to Patient/Care No Lauralyn Shadowens: Provided on Clinical Summary of Care: 11/11/2016 Form Type Recipient Paper Patient JJ Electronic Signature(s) Signed: 11/11/2016 4:42:53 PM By: Alric Quan Previous Signature: 11/11/2016 10:06:30 AM Version By: Ruthine Dose Entered By: Alric Quan on 11/11/2016 10:12:27 [name] ([id number]) -------------------------------------------------------------------------------- Lower Extremity Assessment Details Patient Name: [name] Date of Service: 11/11/2016 9:45 AM Medical Record Number: [id number] Patient Account Number: [id number] Date of Birth/Sex: 10-28-1945 (71 y.o. Male) Treating RN: Ahmed Prima Primary Care Moyses Pavey: [name] Other Clinician: Referring Japleen Tornow: [name] Treating Aleesia Henney/Extender: Frann Rider in Treatment: 5 Electronic Signature(s) Signed: 11/11/2016 4:42:53 PM By: Alric Quan Entered By: Alric Quan on 11/11/2016 09:56:43 [name] ([id number]) -------------------------------------------------------------------------------- Multi Wound Chart Details Patient Name: [name] Date of Service: 11/11/2016 9:45 AM Medical Record Number: [id number] Patient Account Number: [id number] Date of Birth/Sex: 05/09/1945 (71 y.o. Male) Treating RN: Ahmed Prima Primary Care Dawn Kiper: [name] Other Clinician: Referring Tameya Kuznia: [name] Treating Glessie Eustice/Extender: Frann Rider in Treatment: 5 Vital Signs Height(in): 70 Pulse(bpm): 75 Weight(lbs): 241 Blood Pressure 157/63 (mmHg): Body Mass Index(BMI): 35 Temperature(F): 97.7 Respiratory Rate 16 (breaths/min): Photos: [1:No Photos] [N/A:N/A] Wound Location: [1:Right Wrist - Posterior] [N/A:N/A] Wounding Event: [1:Trauma] [N/A:N/A] Primary Etiology: [1:Trauma, Other] [N/A:N/A] Comorbid History:  [1:Hypertension, Type II Diabetes, Gout, Osteoarthritis, Dementia, Neuropathy] [N/A:N/A] Date Acquired: [1:09/20/2016] [N/A:N/A] Weeks of Treatment: [1:5] [N/A:N/A] Wound Status: [1:Open] [N/A:N/A] Measurements L x W x D 0.3x0.1x0.1 [N/A:N/A] (cm) Area (cm) : [1:0.024] [N/A:N/A] Volume (cm) : [1:0.002] [N/A:N/A] % Reduction in Area: [1:99.80%] [N/A:N/A] % Reduction in Volume: 100.00% [N/A:N/A] Classification: [1:Full Thickness With Exposed Support Structures] [N/A:N/A] Exudate Amount: [1:Medium] [N/A:N/A] Exudate Type: [1:Serous] [N/A:N/A] Exudate Color: [1:amber] [N/A:N/A] Wound Margin: [1:Flat and Intact] [N/A:N/A] Granulation Amount: [1:Large (67-100%)] [N/A:N/A] Granulation Quality: [1:Pink] [N/A:N/A] Necrotic Amount: [1:Small (1-33%)] [N/A:N/A] Exposed Structures: [1:Fat Layer (Subcutaneous Tissue) Exposed: Yes Fascia: No] [N/A:N/A] Tendon: No Muscle: No Joint: No Bone: No Epithelialization: Small (1-33%) N/A N/A Debridement: Debridement (09381- N/A N/A 11047) Pre-procedure  09:56 N/A N/A Verification/Time Out Taken: Pain Control: Lidocaine 4% Topical N/A N/A Solution Tissue Debrided: Fibrin/Slough, Exudates, N/A N/A Subcutaneous Level: Skin/Subcutaneous N/A N/A Tissue Debridement Area (sq 0.03 N/A N/A cm): Instrument: Curette N/A N/A Bleeding: Minimum N/A N/A Hemostasis Achieved: Pressure N/A N/A Procedural Pain: 0 N/A N/A Post Procedural Pain: 0 N/A N/A Debridement Treatment Procedure was tolerated N/A N/A Response: well Post Debridement 0.3x0.2x0.1 N/A N/A Measurements L x W x D (cm) Post Debridement 0.005 N/A N/A Volume: (cm) Periwound Skin Texture: Scarring: Yes N/A N/A Excoriation: No Induration: No Callus: No Crepitus: No Rash: No Periwound Skin Maceration: No N/A N/A Moisture: Dry/Scaly: No Periwound Skin Color: Erythema: Yes N/A N/A Atrophie Blanche: No Cyanosis: No Ecchymosis: No Hemosiderin Staining: No Mottled: No Pallor:  No Rubor: No Erythema Location: Circumferential N/A N/A Tenderness on Yes N/A N/A Palpation: Wound Preparation: N/A N/A [name], [name] ([id number]) Ulcer Cleansing: Rinsed/Irrigated with Saline Topical Anesthetic Applied: Other: lidocaine 4% Procedures Performed: Debridement N/A N/A Treatment Notes Electronic Signature(s) Signed: 11/11/2016 10:06:08 AM By: Christin Fudge MD, FACS Entered By: Christin Fudge on 11/11/2016 10:06:08 [name] ([id number]) -------------------------------------------------------------------------------- Multi-Disciplinary Care Plan Details Patient Name: [name] Date of Service: 11/11/2016 9:45 AM Medical Record Number: [id number] Patient Account Number: [id number] Date of Birth/Sex: Dec 12, 1945 (71 y.o. Male) Treating RN: Ahmed Prima Primary Care Brayam Boeke: [name] Other Clinician: Referring Broox Lonigro: [name] Treating Kensi Karr/Extender: Frann Rider in Treatment: 5 Active Inactive ` Abuse / Safety / Falls / Self Care Management Nursing Diagnoses: Impaired physical mobility Goals: Patient will remain injury free related to falls Date Initiated: 10/04/2016 Target Resolution Date: 12/03/2016 Goal Status: Active Interventions: Assess fall risk on admission and as needed Notes: ` Orientation to the Wound Care Program Nursing Diagnoses: Knowledge deficit related to the wound healing center program Goals: Patient/caregiver will verbalize understanding of the [facility] Date Initiated: 10/04/2016 Target Resolution Date: 12/03/2016 Goal Status: Active Interventions: Provide education on orientation to the wound center Notes: ` Wound/Skin Impairment Nursing Diagnoses: Impaired tissue integrity [name], [name] ([id number]) Goals: Patient/caregiver will verbalize understanding of skin care regimen Date Initiated: 10/04/2016 Target Resolution Date: 12/03/2016 Goal Status:  Active Ulcer/skin breakdown will have a volume reduction of 30% by week 4 Date Initiated: 10/04/2016 Target Resolution Date: 12/03/2016 Goal Status: Active Ulcer/skin breakdown will have a volume reduction of 50% by week 8 Date Initiated: 10/04/2016 Target Resolution Date: 12/03/2016 Goal Status: Active Ulcer/skin breakdown will have a volume reduction of 80% by week 12 Date Initiated: 10/04/2016 Target Resolution Date: 12/03/2016 Goal Status: Active Ulcer/skin breakdown will heal within 14 weeks Date Initiated: 10/04/2016 Target Resolution Date: 12/03/2016 Goal Status: Active Interventions: Assess patient/caregiver ability to obtain necessary supplies Assess patient/caregiver ability to perform ulcer/skin care regimen upon admission and as needed Assess ulceration(s) every visit Notes: Electronic Signature(s) Signed: 11/11/2016 4:42:53 PM By: Alric Quan Entered By: Alric Quan on 11/11/2016 09:56:52 [name] ([id number]) -------------------------------------------------------------------------------- Pain Assessment Details Patient Name: [name] Date of Service: 11/11/2016 9:45 AM Medical Record Number: [id number] Patient Account Number: [id number] Date of Birth/Sex: [birth year] (71 y.o. Male) Treating RN: Ahmed Prima Primary Care Rayfield Beem: [name] Other Clinician: Referring Shailey Butterbaugh: [name] Treating Latha Staunton/Extender: Frann Rider in Treatment: 5 Active Problems Location of Pain Severity and Description of Pain Patient Has Paino No Site Locations With Dressing Change: No Pain Management and Medication Current Pain Management: Electronic Signature(s) Signed: 11/11/2016 4:42:53 PM By: Alric Quan Entered By: Alric Quan on 11/11/2016 09:52:44  [name], [name] ([id number]) -------------------------------------------------------------------------------- Patient/Caregiver Education Details Patient Name: [name] Date of Service: 11/11/2016 9:45 AM Medical Record Number: [id number] Patient Account Number: [id number] Date of Birth/Gender: January 03, 1946 (70 y.o. Male) Treating RN: Ahmed Prima Primary Care Physician: [name] Other Clinician: Referring Physician: [name] Treating Physician/Extender: Frann Rider in Treatment: 5 Education Assessment Education Provided To: Patient Education Topics Provided Wound/Skin Impairment: Handouts: Other: change dressing as ordered Methods: Demonstration, Explain/Verbal Responses: State content correctly Electronic Signature(s) Signed: 11/11/2016 4:42:53 PM By: Alric Quan Entered By: Alric Quan on 11/11/2016 10:12:37 [name] ([id number]) -------------------------------------------------------------------------------- Wound Assessment Details Patient Name: [name] Date of Service: 11/11/2016 9:45 AM Medical Record Number: [id number] Patient Account Number: [id number] Date of Birth/Sex: [birth year] (71 y.o. Male) Treating RN: Carolyne Fiscal, Debi Primary Care Aryani Daffern: [name] Other Clinician: Referring Yuka Lallier: [name] Treating Shivangi Lutz/Extender: Frann Rider in Treatment: 5 Wound Status Wound Number: 1 Primary Trauma, Other Etiology: Wound Location: Right Wrist - Posterior Wound Open Wounding Event: Trauma Status: Date Acquired: 09/20/2016 Comorbid Hypertension, Type II Diabetes, Gout, Weeks Of Treatment: 5 History: Osteoarthritis, Dementia, Neuropathy Clustered Wound: No Photos Photo Uploaded By: Montey Hora on 11/11/2016 10:08:57 Wound Measurements Length: (cm) 0.3 Width: (cm) 0.1 Depth: (cm) 0.1 Area: (cm) 0.024 Volume: (cm) 0.002 % Reduction in Area: 99.8% % Reduction in Volume: 100% Epithelialization: Small (1-33%) Tunneling: No Undermining: No Wound Description Full Thickness With Exposed Classification: Support Structures Wound Margin: Flat and  Intact Exudate Medium Amount: Exudate Type: Serous Exudate Color: amber Foul Odor After Cleansing: No Slough/Fibrino Yes Wound Bed Granulation Amount: Large (67-100%) Exposed Structure Granulation Quality: Pink Fascia Exposed: No Necrotic Amount: Small (1-33%) Fat Layer (Subcutaneous Tissue) Exposed: Yes [name], [name] ([id number]) Necrotic Quality: Adherent Slough Tendon Exposed: No Muscle Exposed: No Joint Exposed: No Bone Exposed: No Periwound Skin Texture Texture Color No Abnormalities Noted: No No Abnormalities Noted: No Callus: No Atrophie Blanche: No Crepitus: No Cyanosis: No Excoriation: No Ecchymosis: No Induration: No Erythema: Yes Rash: No Erythema Location: Circumferential Scarring: Yes Hemosiderin Staining: No Mottled: No Moisture Pallor: No No Abnormalities Noted: No Rubor: No Dry / Scaly: No Maceration: No Temperature / Pain Tenderness on Palpation: Yes Wound Preparation Ulcer Cleansing: Rinsed/Irrigated with Saline Topical Anesthetic Applied: Other: lidocaine 4%, Treatment Notes Wound #1 (Right, Posterior Wrist) 1. Cleansed with: Clean wound with Normal Saline 2. Anesthetic Topical Lidocaine 4% cream to wound bed prior to debridement 4. Dressing Applied: Prisma Ag Notes coverlet Electronic Signature(s) Signed: 11/11/2016 4:42:53 PM By: Alric Quan Entered By: Alric Quan on 11/11/2016 09:54:23 [name] ([id number]) -------------------------------------------------------------------------------- Urs[facility] Details Patient Name: [name] Date of Service: 11/11/2016 9:45 AM Medical Record Number: [id number] Patient Account Number: [id number] Date of Birth/Sex: 1946-02-03 (71 y.o. Male) Treating RN: Carolyne Fiscal, Debi Primary Care Tyrell Brereton: [name] Other Clinician: Referring Orlie Cundari: [name] Treating Travante Knee/Extender: Frann Rider in Treatment: 5 Vital Signs Time Taken: 09:52 Temperature  (F): 97.7 Height (in): 70 Pulse (bpm): 75 Weight (lbs): 241 Respiratory Rate (breaths/min): 16 Body Mass Index (BMI): 34.6 Blood Pressure (mmHg): 157/63 Reference Range: 80 - 120 mg / dl Electronic Signature(s) Signed: 11/11/2016 4:42:53 PM By: Alric Quan Entered By: Alric Quan on 11/11/2016 09:53:37

## 2016-11-18 ENCOUNTER — Encounter: Payer: Medicare Other | Admitting: Physician Assistant

## 2016-11-18 DIAGNOSIS — F0390 Unspecified dementia without behavioral disturbance: Secondary | ICD-10-CM | POA: Diagnosis not present

## 2016-11-18 DIAGNOSIS — E11622 Type 2 diabetes mellitus with other skin ulcer: Secondary | ICD-10-CM | POA: Diagnosis not present

## 2016-11-18 DIAGNOSIS — S41101A Unspecified open wound of right upper arm, initial encounter: Secondary | ICD-10-CM | POA: Diagnosis not present

## 2016-11-18 DIAGNOSIS — E1140 Type 2 diabetes mellitus with diabetic neuropathy, unspecified: Secondary | ICD-10-CM | POA: Diagnosis not present

## 2016-11-18 DIAGNOSIS — L98492 Non-pressure chronic ulcer of skin of other sites with fat layer exposed: Secondary | ICD-10-CM | POA: Diagnosis not present

## 2016-11-20 NOTE — Progress Notes (Signed)
LORNE, WINKELS ([id number]) Visit Report for 11/18/2016 Chief Complaint Document Details Patient Name: [name], [name] Date of Service: 11/18/2016 3:30 PM Medical Record Number: [id number] Patient Account Number: [id number] Date of Birth/Sex: 1946/03/31 (71 y.o. Male) Treating RN: Ahmed Prima Primary Care Provider: [name] Other Clinician: Referring Provider: [name] Treating Provider/Extender: Melburn Hake, Beyonce Sawatzky Weeks in Treatment: 6 Information Obtained from: Patient Chief Complaint Right forearm and hand traumatic ulcer Electronic Signature(s) Signed: 11/19/2016 10:18:42 AM By: Worthy Keeler [name] Entered By: Worthy Keeler on 11/18/2016 16:17:10 [name] ([id number]) -------------------------------------------------------------------------------- HPI Details Patient Name: [name] Date of Service: 11/18/2016 3:30 PM Medical Record Number: [id number] Patient Account Number: [id number] Date of Birth/Sex: Jun 01, 1945 (71 y.o. Male) Treating RN: Carolyne Fiscal, Debi Primary Care Provider: [name] Other Clinician: Referring Provider: [name] Treating Provider/Extender: Melburn Hake, Carlin Attridge Weeks in Treatment: 6 History of Present Illness Location: Right forearm and hand Quality: Patient states that he has a mild sore sensation Severity: 1/10 Duration: For approximately 2 weeks prior to initial evaluation in the wound center Timing: With cleansing of the wound or pressure in general Context: Patientos wound was initiated by him falling into glass causing multiple lacerations though the hand laceration is the worst. Modifying Factors: Patient has recently been on antibiotics for this wound and was soaking it in peroxide for a time. Most recently antibiotic cream has been used Associated Signs and Symptoms: Uncontrolled DM Type II HPI Description: 10/04/16 on evaluation today patient presents for initial evaluation concerning a  laceration of the right wrist and hand on the bowler aspect. He tells me that he fell into glass following a medication change in regard to his diabetes. He initially went to [facility] where she was sutured and subsequently his daughter who is a Marine scientist removed the teachers at the appropriate time. Unfortunately the wound has done poorly and dehissed which has caused him some trouble including infection. He has been on antibiotics both topically and orally though the wound continues to show signs of necrotic tissue according to notes which is what he was sent to [place] for evaluation concerning. He does have diabetes and unfortunately this is severely uncontrolled. He does see an endocrinologist and is working on this. 10/11/2016 -- the patient's notes from the ER were reviewed and he had his suturing done on 09/17/2016 and had necrotic skin flaps which had to be trimmed the last visit he was here. He has managed to get his Santyl ointment and has been using this appropriately. 10/21/16 patient's wound appears to be doing very well on evaluation today. He still has some Slough covering the wound bed but this is nowhere near as severe as when i saw this initially two weeks ago nor even my review of his pictures last week. I'm pleased with how this is progressing. Patient's wound over the right form appears to be doing fairly well on evaluation today. There is no evidence of infection and he has a small area at the crease of where she is wrist extends and flexes that is still open and appears to be having difficulty due to the fact that it is cracking open. I think this is something that may be controlled with moisture control that is keeping the wound a little bit more moist. Nonetheless this is a tiny region compared to the initial wound and overall appears to be doing very well. No fevers, chills, nausea, or vomiting noted at this time. Electronic Signature(s) Signed: 11/19/2016 10:18:42  AM By:  Worthy Keeler [name] Entered By: Worthy Keeler on 11/18/2016 16:18:26 [name] ([name] ([id number]) -------------------------------------------------------------------------------- Physical Exam Details Patient Name: [name] Date of Service: 11/18/2016 3:30 PM Medical Record Number: [id number] Patient Account Number: [id number] Date of Birth/Sex: Nov 15, 1945 (71 y.o. Male) Treating RN: Carolyne Fiscal, Debi Primary Care Provider: [name] Other Clinician: Referring Provider: [name] Treating Provider/Extender: [name], Meredith Kilbride Weeks in Treatment: 6 Constitutional Well-nourished and well-hydrated in no acute distress. Respiratory normal breathing without difficulty. clear to auscultation bilaterally. Cardiovascular regular rate and rhythm with normal S1, S2. Psychiatric this patient is able to make decisions and demonstrates good insight into disease process. Alert and Oriented x 3. pleasant and cooperative. Notes On evaluation today patient's wound is very tiny and shows good granulation. There is some scarring around the area that opened and I believe the main issue currently is that bending both flexion and extension at this site seems to be keeping the region open. We need this to granulate in and close up. Electronic Signature(s) Signed: 11/19/2016 10:18:42 AM By: Worthy Keeler [name] Entered By: Worthy Keeler on 11/18/2016 16:19:05 [name] ([id number]) -------------------------------------------------------------------------------- Physician Orders Details Patient Name: [name] Date of Service: 11/18/2016 3:30 PM Medical Record Number: [id number] Patient Account Number: [id number] Date of Birth/Sex: 01-18-1946 (71 y.o. Male) Treating RN: Ahmed Prima Primary Care Provider: [name] Other Clinician: Referring Provider: [name] Treating Provider/Extender: Melburn Hake, Gursimran Litaker Weeks in  Treatment: 6 Verbal / Phone Orders: Yes ClinicianCarolyne Fiscal, Debi Read Back and Verified: Yes Diagnosis Coding ICD-10 Coding Code Description E11.622 Type 2 diabetes mellitus with other skin ulcer S41.101A Unspecified open wound of right upper arm, initial encounter L98.492 Non-pressure chronic ulcer of skin of other sites with fat layer exposed Wound Cleansing Wound #1 Right,Posterior Wrist o Clean wound with Normal Saline. Anesthetic Wound #1 Right,Posterior Wrist o Topical Lidocaine 4% cream applied to wound bed prior to debridement Primary Wound Dressing Wound #1 Right,Posterior Wrist o Hydrogel o Cutimed Sorbact Secondary Dressing Wound #1 Right,Posterior Wrist o Other - coverlet Dressing Change Frequency Wound #1 Right,Posterior Wrist o Change dressing every day. Follow-up Appointments Wound #1 Right,Posterior Wrist o Return Appointment in 1 week. Notes TYRIQUE, SPORN ([id number]) We will see patient for reevaluation in one week to see were things stand at that point. In the interim we will make wound care changes per the above orders please see above for specifics. If anything worsens in the interim patient will contact our office for additional recommendations. Electronic Signature(s) Signed: 11/19/2016 10:18:42 AM By: Worthy Keeler [name] Entered By: Worthy Keeler on 11/18/2016 16:19:34 [name] ([id number]) -------------------------------------------------------------------------------- Problem List Details Patient Name: [name] Date of Service: 11/18/2016 3:30 PM Medical Record Number: [id number] Patient Account Number: [id number] Date of Birth/Sex: April 29, 1945 (71 y.o. 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  Date of Service: 11/18/2016 3:30 PM Medical Record Number: [id number] Patient Account Number: [id number] Date of Birth/Sex: 1945-10-21 (71 y.o. Male) Treating RN: Ahmed Prima Primary Care Provider: [name] Other Clinician: Referring Provider: [name] Treating Provider/Extender: Melburn Hake, Burnett Lieber Weeks in Treatment: 6 Subjective Chief Complaint Information obtained from Patient Right forearm and hand traumatic ulcer History of Present Illness (HPI) The following HPI elements were documented for the patient's wound: Location: Right forearm and hand Quality: Patient states that he has a mild sore sensation Severity: 1/10 Duration: For approximately 2 weeks prior to initial evaluation in the wound center Timing: With cleansing of the wound or pressure in general Context: Patient s wound was initiated by him falling into glass causing multiple lacerations though the hand laceration is the worst. Modifying Factors: Patient has recently been on antibiotics for this wound and was soaking it in peroxide for a time. Most recently antibiotic cream has been used Associated Signs and Symptoms: Uncontrolled DM Type II 10/04/16 on evaluation today patient presents for initial evaluation concerning a laceration of the right wrist and hand on the bowler aspect. He tells me that he fell into  glass following a medication change in regard to his diabetes. He initially went to [facility] where she was sutured and subsequently his daughter who is a Marine scientist removed the teachers at the appropriate time. Unfortunately the wound has done poorly and dehissed which has caused him some trouble including infection. He has been on antibiotics both topically and orally though the wound continues to show signs of necrotic tissue according to notes which is what he was sent to [place] for evaluation concerning. He does have diabetes and unfortunately this is severely uncontrolled. He does see an endocrinologist and is working on this. 10/11/2016 -- the patient's notes from the ER were reviewed and he had his suturing done on 09/17/2016 and had necrotic skin flaps which had to be trimmed the last visit he was here. He has managed to get his Santyl ointment and has been using this appropriately. 10/21/16 patient's wound appears to be doing very well on evaluation today. He still has some Slough covering the wound bed but this is nowhere near as severe as when i saw this initially two weeks ago nor even my review of his pictures last week. I'm pleased with how this is progressing. Patient's wound over the right form appears to be doing fairly well on evaluation today. There is no evidence of infection and he has a small area at the crease of where she is wrist extends and flexes that is still open and appears to be having difficulty due to the fact that it is cracking open. I think this is something that may be controlled with moisture control that is keeping the wound a little bit more moist. Nonetheless ZAVON, HYSON E. ([id number]) this is a tiny region compared to the initial wound and overall appears to be doing very well. No fevers, chills, nausea, or vomiting noted at this time. Objective Constitutional Well-nourished and well-hydrated in no acute distress. Vitals Time Taken: 3:41 PM, Height: 70  in, Weight: 241 lbs, BMI: 34.6, Temperature: 97.7 F, Pulse: 103 bpm, Respiratory Rate: 18 breaths/min, Blood Pressure: 144/69 mmHg. Respiratory normal breathing without difficulty. clear to auscultation bilaterally. Cardiovascular regular rate and rhythm with normal S1, S2. Psychiatric this patient is able to make decisions and demonstrates good insight into disease process. Alert and Oriented x 3. pleasant and cooperative. General Notes: On evaluation today  patient's wound is very tiny and shows good granulation. There is some scarring around the area that opened and I believe the main issue currently is that bending both flexion and extension at this site seems to be keeping the region open. We need this to granulate in and close up. Integumentary (Hair, Skin) Wound #1 status is Open. Original cause of wound was Trauma. The wound is located on the Right,Posterior Wrist. The wound measures 0.3cm length x 0.1cm width x 0.1cm depth; 0.024cm^2 area and 0.002cm^3 volume. There is Fat Layer (Subcutaneous Tissue) Exposed exposed. There is no tunneling or undermining noted. There is a medium amount of serous drainage noted. The wound margin is flat and intact. There is large (67-100%) pink granulation within the wound bed. There is a small (1-33%) amount of necrotic tissue within the wound bed including Adherent Slough. The periwound skin appearance exhibited: Scarring, Erythema. The periwound skin appearance did not exhibit: Callus, Crepitus, Excoriation, Induration, Rash, Dry/Scaly, Maceration, Atrophie Blanche, Cyanosis, Ecchymosis, Hemosiderin Staining, Mottled, Pallor, Rubor. The surrounding wound skin color is noted with erythema which is circumferential. The periwound has tenderness on palpation. BASIR, NIVEN ([id number]) Assessment Active Problems ICD-10 E11.622 - Type 2 diabetes mellitus with other skin ulcer S41.101A - Unspecified open wound of right upper arm, initial  encounter L98.492 - Non-pressure chronic ulcer of skin of other sites with fat layer exposed Plan Wound Cleansing: Wound #1 Right,Posterior Wrist: Clean wound with Normal Saline. Anesthetic: Wound #1 Right,Posterior Wrist: Topical Lidocaine 4% cream applied to wound bed prior to debridement Primary Wound Dressing: Wound #1 Right,Posterior Wrist: Hydrogel Cutimed Sorbact Secondary Dressing: Wound #1 Right,Posterior Wrist: Other - coverlet Dressing Change Frequency: Wound #1 Right,Posterior Wrist: Change dressing every day. Follow-up Appointments: Wound #1 Right,Posterior Wrist: Return Appointment in 1 week. General Notes: We will see patient for reevaluation in one week to see were things stand at that point. In the interim we will make wound care changes per the above orders please see above for specifics. If anything worsens in the interim patient will contact our office for additional recommendations. Electronic Signature(s) Signed: 11/19/2016 10:18:42 AM By: Waynard Reeds, Juventino EMarland Kitchen ([id number]) Entered By: Worthy Keeler on 11/18/2016 16:19:43 [name] ([id number]) -------------------------------------------------------------------------------- SuperBill Details Patient Name: [name] Date of Service: 11/18/2016 Medical Record Number: [id number] Patient Account Number: [id number] Date of Birth/Sex: November 20, 1945 (71 y.o. Male) Treating RN: Ahmed Prima Primary Care Provider: [name] Other Clinician: Referring Provider: [name] Treating Provider/Extender: Melburn Hake, Orvin Netter Weeks in Treatment: 6 Diagnosis Coding ICD-10 Codes Code Description E11.622 Type 2 diabetes mellitus with other skin ulcer S41.101A Unspecified open wound of right upper arm, initial encounter L98.492 Non-pressure chronic ulcer of skin of other sites with fat layer exposed Facility Procedures CPT4 Code: [id number] Description: [facility] VISIT-LEV  3 EST PT Modifier: Quantity: 1 Physician Procedures CPT4 Code Description: [id number] [facility] - WC PHYS LEVEL 3 - EST PT ICD-10 Description Diagnosis E11.622 Type 2 diabetes mellitus with other skin ulcer S41.101A Unspecified open wound of right upper arm, initial L98.492 Non-pressure chronic ulcer of skin of  other sites w Modifier: encounter ith fat layer Quantity: 1 exposed Electronic Signature(s) Signed: 11/18/2016 4:59:12 PM By: Alric Quan Signed: 11/19/2016 10:18:42 AM By: Worthy Keeler [name] Entered By: Alric Quan on 11/18/2016 16:33:14

## 2016-11-20 NOTE — Progress Notes (Signed)
EXODUS, KUTZER ([id number]) Visit Report for 11/18/2016 Arrival Information Details Patient Name: [name], [name] Date of Service: 11/18/2016 3:30 PM Medical Record Number: [id number] Patient Account Number: [id number] Date of Birth/Sex: 1946-02-18 (71 y.o. Male) Treating RN: Ahmed Prima Primary Care Emarie Paul: [name] Other Clinician: Referring Faith Branan: [name] Treating Jodiann Ognibene/Extender: Melburn Hake, HOYT Weeks in Treatment: 6 Visit Information History Since Last Visit All ordered tests and consults were completed: No Patient Arrived: Ambulatory Added or deleted any medications: No Arrival Time: 15:40 Any new allergies or adverse reactions: No Accompanied By: self Had a fall or experienced change in No Transfer Assistance: None activities of daily living that may affect Patient Identification Verified: Yes risk of falls: Secondary Verification Process Yes Signs or symptoms of abuse/neglect since last No Completed: visito Patient Requires Transmission-Based No Hospitalized since last visit: No Precautions: Has Dressing in Place as Prescribed: Yes Patient Has Alerts: Yes Pain Present Now: No Patient Alerts: DMII Electronic Signature(s) Signed: 11/18/2016 4:59:12 PM By: Alric Quan Entered By: Alric Quan on 11/18/2016 15:41:31 [name] ([id number]) -------------------------------------------------------------------------------- Clinic Level of Care Assessment Details Patient Name: [name] Date of Service: 11/18/2016 3:30 PM Medical Record Number: [id number] Patient Account Number: [id number] Date of Birth/Sex: 09-06-1945 (71 y.o. Male) Treating RN: Carolyne Fiscal, Debi Primary Care Jadesola Poynter: [name] Other Clinician: Referring Arijana Narayan: [name] Treating Aniza Shor/Extender: Melburn Hake, HOYT Weeks in Treatment: 6 Clinic Level of Care Assessment Items TOOL 4 Quantity Score X - Use when only an EandM is performed on  FOLLOW-UP visit 1 0 ASSESSMENTS - Nursing Assessment / Reassessment X - Reassessment of Co-morbidities (includes updates in patient status) 1 10 X - Reassessment of Adherence to Treatment Plan 1 5 ASSESSMENTS - Wound and Skin Assessment / Reassessment X - Simple Wound Assessment / Reassessment - one wound 1 5 []  - Complex Wound Assessment / Reassessment - multiple wounds 0 []  - Dermatologic / Skin Assessment (not related to wound area) 0 ASSESSMENTS - Focused Assessment []  - Circumferential Edema Measurements - multi extremities 0 []  - Nutritional Assessment / Counseling / Intervention 0 []  - Lower Extremity Assessment (monofilament, tuning fork, pulses) 0 []  - Peripheral Arterial Disease Assessment (using hand held doppler) 0 ASSESSMENTS - Ostomy and/or Continence Assessment and Care []  - Incontinence Assessment and Management 0 []  - Ostomy Care Assessment and Management (repouching, etc.) 0 PROCESS - Coordination of Care X - Simple Patient / Family Education for ongoing care 1 15 []  - Complex (extensive) Patient / Family Education for ongoing care 0 []  - Staff obtains Programmer, systems, Records, Test Results / Process Orders 0 []  - Staff telephones HHA, Nursing Homes / Clarify orders / etc 0 []  - Routine Transfer to another Facility (non-emergent condition) 0 DEIDRICK, RAINEY ([id number]) []  - Routine Hospital Admission (non-emergent condition) 0 []  - New Admissions / Biomedical engineer / Ordering NPWT, Apligraf, etc. 0 []  - Emergency Hospital Admission (emergent condition) 0 X - Simple Discharge Coordination 1 10 []  - Complex (extensive) Discharge Coordination 0 PROCESS - Special Needs []  - Pediatric / Minor Patient Management 0 []  - Isolation Patient Management 0 []  - Hearing / Language / Visual special needs 0 []  - Assessment of Community assistance (transportation, D/C planning, etc.) 0 []  - Additional assistance / Altered mentation 0 []  - Support Surface(s) Assessment (bed,  cushion, seat, etc.) 0 INTERVENTIONS - Wound Cleansing / Measurement X - Simple Wound Cleansing - one wound 1 5 []  - Complex Wound Cleansing - multiple wounds 0 X -  Wound Imaging (photographs - any number of wounds) 1 5 []  - Wound Tracing (instead of photographs) 0 X - Simple Wound Measurement - one wound 1 5 []  - Complex Wound Measurement - multiple wounds 0 INTERVENTIONS - Wound Dressings X - Small Wound Dressing one or multiple wounds 1 10 []  - Medium Wound Dressing one or multiple wounds 0 []  - Large Wound Dressing one or multiple wounds 0 X - Application of Medications - topical 1 5 []  - Application of Medications - injection 0 INTERVENTIONS - Miscellaneous []  - External ear exam 0 BRADON, FESTER ([id number]) []  - Specimen Collection (cultures, biopsies, blood, body fluids, etc.) 0 []  - Specimen(s) / Culture(s) sent or taken to Lab for analysis 0 []  - Patient Transfer (multiple staff / Harrel Lemon Lift / Similar devices) 0 []  - Simple Staple / Suture removal (25 or less) 0 []  - Complex Staple / Suture removal (26 or more) 0 []  - Hypo / Hyperglycemic Management (close monitor of Blood Glucose) 0 []  - Ankle / Brachial Index (ABI) - do not check if billed separately 0 X - Vital Signs 1 5 Has the patient been seen at the hospital within the last three years: Yes Total Score: 80 Level Of Care: New/Established - Level 3 Electronic Signature(s) Signed: 11/18/2016 4:59:12 PM By: Alric Quan Entered By: Alric Quan on 11/18/2016 16:33:05 [name] ([id number]) -------------------------------------------------------------------------------- Encounter Discharge Information Details Patient Name: [name] Date of Service: 11/18/2016 3:30 PM Medical Record Number: [id number] Patient Account Number: [id number] Date of Birth/Sex: August 05, 1945 (71 y.o. Male) Treating RN: Ahmed Prima Primary Care Ayan Yankey: [name] Other Clinician: Referring Ziare Cryder: [name] Treating Cenia Zaragosa/Extender: Melburn Hake, HOYT Weeks in Treatment: 6 Encounter Discharge Information Items Discharge Pain Level: 0 Discharge Condition: Stable Ambulatory Status: Ambulatory Discharge Destination: Home Transportation: Private Auto Accompanied By: self Schedule Follow-up Appointment: Yes Medication Reconciliation completed No and provided to Patient/Care Evelia Waskey: Patient Clinical Summary of Care: Declined Electronic Signature(s) Signed: 11/18/2016 4:59:12 PM By: Alric Quan Entered By: Alric Quan on 11/18/2016 [place], [place]. ([id number]) -------------------------------------------------------------------------------- Lower Extremity Assessment Details Patient Name: [name] Date of Service: 11/18/2016 3:30 PM Medical Record Number: [id number] Patient Account Number: [id number] Date of Birth/Sex: 01-20-1946 (71 y.o. Male) Treating RN: Ahmed Prima Primary Care [place] Kenley: [name] Other Clinician: Referring Evin Loiseau: [name] Treating Webster Patrone/Extender: Melburn Hake, HOYT Weeks in Treatment: 6 Electronic Signature(s) Signed: 11/18/2016 4:59:12 PM By: Alric Quan Entered By: Alric Quan on 11/18/2016 16:03:59 [name] ([id number]) -------------------------------------------------------------------------------- Multi Wound Chart Details Patient Name: [name] Date of Service: 11/18/2016 3:30 PM Medical Record Number: [id number] Patient Account Number: [id number] Date of Birth/Sex: 1945-04-27 (71 y.o. Male) Treating RN: Carolyne Fiscal, Debi Primary Care Doyt Castellana: [name] Other Clinician: Referring Quante Pettry: [name] Treating Jajuan Skoog/Extender: [name], HOYT Weeks in Treatment: 6 Vital Signs Height(in): 70 Pulse(bpm): 103 Weight(lbs): 241 Blood Pressure 144/69 (mmHg): Body Mass Index(BMI): 35 Temperature(F): 97.7 Respiratory Rate 18 (breaths/min): Photos: [1:No  Photos] [N/A:N/A] Wound Location: [1:Right Wrist - Posterior] [N/A:N/A] Wounding Event: [1:Trauma] [N/A:N/A] Primary Etiology: [1:Trauma, Other] [N/A:N/A] Comorbid History: [1:Hypertension, Type II Diabetes, Gout, Osteoarthritis, Dementia, Neuropathy] [N/A:N/A] Date Acquired: [1:09/20/2016] [N/A:N/A] Weeks of Treatment: [1:6] [N/A:N/A] Wound Status: [1:Open] [N/A:N/A] Measurements L x W x D 0.3x0.1x0.1 [N/A:N/A] (cm) Area (cm) : [1:0.024] [N/A:N/A] Volume (cm) : [1:0.002] [N/A:N/A] % Reduction in Area: [1:99.80%] [N/A:N/A] % Reduction in Volume: 100.00% [N/A:N/A] Classification: [1:Full Thickness With Exposed Support Structures] [N/A:N/A] Exudate Amount: [1:Medium] [N/A:N/A] Exudate Type: [1:Serous] [N/A:N/A]  Exudate Color: [1:amber] [N/A:N/A] Wound Margin: [1:Flat and Intact] [N/A:N/A] Granulation Amount: [1:Large (67-100%)] [N/A:N/A] Granulation Quality: [1:Pink] [N/A:N/A] Necrotic Amount: [1:Small (1-33%)] [N/A:N/A] Exposed Structures: [1:Fat Layer (Subcutaneous Tissue) Exposed: Yes Fascia: No] [N/A:N/A] Tendon: No Muscle: No Joint: No Bone: No Epithelialization: Small (1-33%) N/A N/A Periwound Skin Texture: Scarring: Yes N/A N/A Excoriation: No Induration: No Callus: No Crepitus: No Rash: No Periwound Skin Maceration: No N/A N/A Moisture: Dry/Scaly: No Periwound Skin Color: Erythema: Yes N/A N/A Atrophie Blanche: No Cyanosis: No Ecchymosis: No Hemosiderin Staining: No Mottled: No Pallor: No Rubor: No Erythema Location: Circumferential N/A N/A Tenderness on Yes N/A N/A Palpation: Wound Preparation: Ulcer Cleansing: N/A N/A Rinsed/Irrigated with Saline Topical Anesthetic Applied: Other: lidocaine 4% Treatment Notes Electronic Signature(s) Signed: 11/18/2016 4:59:12 PM By: Alric Quan Entered By: Alric Quan on 11/18/2016 16:04:10 [name]  ([id number]) -------------------------------------------------------------------------------- Multi-Disciplinary Care Plan Details Patient Name: [name] Date of Service: 11/18/2016 3:30 PM Medical Record Number: [id number] Patient Account Number: [id number] Date of Birth/Sex: Nov 10, 1945 (71 y.o. Male) Treating RN: Ahmed Prima Primary Care Reeshemah Nazaryan: [name] Other Clinician: Referring Miara Emminger: [name] Treating Mikaila Grunert/Extender: Melburn Hake, HOYT Weeks in Treatment: 6 Active Inactive ` Abuse / Safety / Falls / Self Care Management Nursing Diagnoses: Impaired physical mobility Goals: Patient will remain injury free related to falls Date Initiated: 10/04/2016 Target Resolution Date: 12/03/2016 Goal Status: Active Interventions: Assess fall risk on admission and as needed Notes: ` Orientation to the Wound Care Program Nursing Diagnoses: Knowledge deficit related to the wound healing center program Goals: Patient/caregiver will verbalize understanding of the [facility] Date Initiated: 10/04/2016 Target Resolution Date: 12/03/2016 Goal Status: Active Interventions: Provide education on orientation to the wound center Notes: ` Wound/Skin Impairment Nursing Diagnoses: Impaired tissue integrity DARIO, YONO ([id number]) Goals: Patient/caregiver will verbalize understanding of skin care regimen Date Initiated: 10/04/2016 Target Resolution Date: 12/03/2016 Goal Status: Active Ulcer/skin breakdown will have a volume reduction of 30% by week 4 Date Initiated: 10/04/2016 Target Resolution Date: 12/03/2016 Goal Status: Active Ulcer/skin breakdown will have a volume reduction of 50% by week 8 Date Initiated: 10/04/2016 Target Resolution Date: 12/03/2016 Goal Status: Active Ulcer/skin breakdown will have a volume reduction of 80% by week 12 Date Initiated: 10/04/2016 Target Resolution Date: 12/03/2016 Goal Status: Active Ulcer/skin  breakdown will heal within 14 weeks Date Initiated: 10/04/2016 Target Resolution Date: 12/03/2016 Goal Status: Active Interventions: Assess patient/caregiver ability to obtain necessary supplies Assess patient/caregiver ability to perform ulcer/skin care regimen upon admission and as needed Assess ulceration(s) every visit Notes: Electronic Signature(s) Signed: 11/18/2016 4:59:12 PM By: Alric Quan Entered By: Alric Quan on 11/18/2016 [place], [place]. ([id number]) -------------------------------------------------------------------------------- Pain Assessment Details Patient Name: [name] Date of Service: 11/18/2016 3:30 PM Medical Record Number: [id number] Patient Account Number: [id number] Date of Birth/Sex: 04-02-1946 (71 y.o. Male) Treating RN: Ahmed Prima Primary Care Makayla Confer: [name] Other Clinician: Referring Jashley Yellin: [name] Treating Dianey Suchy/Extender: Melburn Hake, HOYT Weeks in Treatment: 6 Active Problems Location of Pain Severity and Description of Pain Patient Has Paino No Site Locations With Dressing Change: No Pain Management and Medication Current Pain Management: Electronic Signature(s) Signed: 11/18/2016 4:59:12 PM By: Alric Quan Entered By: Alric Quan on 11/18/2016 15:41:38 [name] ([id number]) -------------------------------------------------------------------------------- Patient/Caregiver Education Details Patient Name: [name] Date of Service: 11/18/2016 3:30 PM Medical Record Number: [id number] Patient Account Number: [id number] Date of Birth/Gender: 1946-01-17 (71 y.o. Male) Treating RN: Ahmed Prima Primary Care Physician: [name] Other Clinician: Referring  Physician: [name] Treating Physician/Extender: Sharalyn Ink in Treatment: 6 Education Assessment Education Provided To: Patient Education Topics Provided Wound/Skin  Impairment: Handouts: Other: change dressing as ordered Methods: Demonstration, Explain/Verbal Responses: State content correctly Electronic Signature(s) Signed: 11/18/2016 4:59:12 PM By: Alric Quan Entered By: Alric Quan on 11/18/2016 16:11:55 [name] ([id number]) -------------------------------------------------------------------------------- Wound Assessment Details Patient Name: [name] Date of Service: 11/18/2016 3:30 PM Medical Record Number: [id number] Patient Account Number: [id number] Date of Birth/Sex: 20-Feb-1946 (71 y.o. Male) Treating RN: Carolyne Fiscal, Debi Primary Care Jaielle Dlouhy: [name] Other Clinician: Referring Angelea Penny: [name] Treating Lalani Winkles/Extender: Melburn Hake, HOYT Weeks in Treatment: 6 Wound Status Wound Number: 1 Primary Trauma, Other Etiology: Wound Location: Right Wrist - Posterior Wound Open Wounding Event: Trauma Status: Date Acquired: 09/20/2016 Comorbid Hypertension, Type II Diabetes, Gout, Weeks Of Treatment: 6 History: Osteoarthritis, Dementia, Neuropathy Clustered Wound: No Photos Photo Uploaded By: Alric Quan on 11/18/2016 16:56:09 Wound Measurements Length: (cm) 0.3 Width: (cm) 0.1 Depth: (cm) 0.1 Area: (cm) 0.024 Volume: (cm) 0.002 % Reduction in Area: 99.8% % Reduction in Volume: 100% Epithelialization: Small (1-33%) Tunneling: No Undermining: No Wound Description Full Thickness With Exposed Classification: Support Structures Wound Margin: Flat and Intact Exudate Medium Amount: Exudate Type: Serous Exudate Color: amber Foul Odor After Cleansing: No Slough/Fibrino Yes Wound Bed Granulation Amount: Large (67-100%) Exposed Structure Granulation Quality: Pink Fascia Exposed: No Necrotic Amount: Small (1-33%) Fat Layer (Subcutaneous Tissue) Exposed: Yes ALPER, GUILMETTE ([id number]) Necrotic Quality: Adherent Slough Tendon Exposed: No Muscle Exposed: No Joint Exposed:  No Bone Exposed: No Periwound Skin Texture Texture Color No Abnormalities Noted: No No Abnormalities Noted: No Callus: No Atrophie Blanche: No Crepitus: No Cyanosis: No Excoriation: No Ecchymosis: No Induration: No Erythema: Yes Rash: No Erythema Location: Circumferential Scarring: Yes Hemosiderin Staining: No Mottled: No Moisture Pallor: No No Abnormalities Noted: No Rubor: No Dry / Scaly: No Maceration: No Temperature / Pain Tenderness on Palpation: Yes Wound Preparation Ulcer Cleansing: Rinsed/Irrigated with Saline Topical Anesthetic Applied: Other: lidocaine 4%, Treatment Notes Wound #1 (Right, Posterior Wrist) 1. Cleansed with: Clean wound with Normal Saline 2. Anesthetic Topical Lidocaine 4% cream to wound bed prior to debridement 4. Dressing Applied: Hydrogel Other dressing (specify in notes) Notes coverlet, sorbact Electronic Signature(s) Signed: 11/18/2016 4:59:12 PM By: Alric Quan Entered By: Alric Quan on 11/18/2016 15:58:51 [name] ([id number]) -------------------------------------------------------------------------------- [place] Details Patient Name: [name] Date of Service: 11/18/2016 3:30 PM Medical Record Number: [id number] Patient Account Number: [id number] Date of Birth/Sex: 06-22-1945 (71 y.o. Male) Treating RN: Carolyne Fiscal, Debi Primary Care Nigel Wessman: [name] Other Clinician: Referring Joffre Lucks: [name] Treating Johanan Skorupski/Extender: Melburn Hake, HOYT Weeks in Treatment: 6 Vital Signs Time Taken: 15:41 Temperature (F): 97.7 Height (in): 70 Pulse (bpm): 103 Weight (lbs): 241 Respiratory Rate (breaths/min): 18 Body Mass Index (BMI): 34.6 Blood Pressure (mmHg): 144/69 Reference Range: 80 - 120 mg / dl Electronic Signature(s) Signed: 11/18/2016 4:59:12 PM By: Alric Quan Entered By: Alric Quan on 11/18/2016 15:42:20

## 2016-11-25 ENCOUNTER — Ambulatory Visit (INDEPENDENT_AMBULATORY_CARE_PROVIDER_SITE_OTHER): Payer: Medicare Other

## 2016-11-25 ENCOUNTER — Encounter: Payer: Medicare Other | Admitting: Physician Assistant

## 2016-11-25 DIAGNOSIS — E291 Testicular hypofunction: Secondary | ICD-10-CM | POA: Diagnosis not present

## 2016-11-25 DIAGNOSIS — L98499 Non-pressure chronic ulcer of skin of other sites with unspecified severity: Secondary | ICD-10-CM | POA: Diagnosis not present

## 2016-11-25 DIAGNOSIS — E349 Endocrine disorder, unspecified: Secondary | ICD-10-CM

## 2016-11-25 DIAGNOSIS — F0390 Unspecified dementia without behavioral disturbance: Secondary | ICD-10-CM | POA: Diagnosis not present

## 2016-11-25 DIAGNOSIS — E1140 Type 2 diabetes mellitus with diabetic neuropathy, unspecified: Secondary | ICD-10-CM | POA: Diagnosis not present

## 2016-11-25 DIAGNOSIS — L98492 Non-pressure chronic ulcer of skin of other sites with fat layer exposed: Secondary | ICD-10-CM | POA: Diagnosis not present

## 2016-11-25 DIAGNOSIS — S41101A Unspecified open wound of right upper arm, initial encounter: Secondary | ICD-10-CM | POA: Diagnosis not present

## 2016-11-25 DIAGNOSIS — E11622 Type 2 diabetes mellitus with other skin ulcer: Secondary | ICD-10-CM | POA: Diagnosis not present

## 2016-11-26 NOTE — Progress Notes (Signed)
[name], [name] ([id number]) Visit Report for 11/25/2016 Chief Complaint Document Details Patient Name: [name], [name] Date of Service: 11/25/2016 10:15 AM Medical Record Number: [id number] Patient Account Number: [id number] Date of Birth/Sex: Sep 02, 1945 (71 y.o. Male) Treating RN: [name] Primary Care Provider: Owens [name] Other Clinician: Referring Provider: Owens [name] Treating Provider/Extender: [name], [name] Weeks in Treatment: 7 Information Obtained from: Patient Chief Complaint Right forearm and hand traumatic ulcer Electronic Signature(s) Signed: 11/25/2016 12:41:32 PM By: Dustin Keeler [name] Entered By: [name] on 11/25/2016 10:55:14 [name] ([id number]) -------------------------------------------------------------------------------- HPI Details Patient Name: [name] Date of Service: 11/25/2016 10:15 AM Medical Record Number: [id number] Patient Account Number: [id number] Date of Birth/Sex: 21-May-1945 (71 y.o. Male) Treating RN: [name], [name] Primary Care Provider: Owens [name] Other Clinician: Referring Provider: Owens [name] Treating Provider/Extender: [name], [name] Weeks in Treatment: 7 History of Present Illness Location: Right forearm and hand Quality: Patient states that he has a mild sore sensation Severity: 0/10 Duration: For approximately 2 weeks prior to initial evaluation in the wound center Timing: With cleansing of the wound or pressure in general Context: Patientos wound was initiated by him falling into glass causing multiple lacerations though the hand laceration is the worst. Modifying Factors: Patient has recently been on antibiotics for this wound and was soaking it in peroxide for a time. Most recently antibiotic cream has been used Associated Signs and Symptoms: Uncontrolled DM Type II HPI Description: 10/04/16 on evaluation today patient presents for initial evaluation concerning a  laceration of the right wrist and hand on the bowler aspect. He tells me that he fell into glass following a medication change in regard to his diabetes. [name] was [facility] and subsequently his daughter who is a [name] removed the teachers at the appropriate time. Unfortunately the wound has done poorly and dehissed which has caused him some trouble including infection. He has been on antibiotics both topically and orally though the wound continues to show signs of necrotic tissue according to notes which is what he was sent to [place] for evaluation concerning. He does have diabetes and unfortunately this is severely uncontrolled. He does see an endocrinologist and is working on this. 10/11/2016 -- the patient's notes from the ER were reviewed and he had his suturing done on 09/17/2016 and had necrotic skin flaps which had to be trimmed the last visit he was here. He has managed to get his Santyl ointment and has been using this appropriately. 10/21/16 patient's wound appears to be doing very well on evaluation today. He still has some Slough covering the wound bed but this is nowhere near as severe as when i saw this initially two weeks ago nor even my review of his pictures last week. I'm pleased with how this is progressing. Patient's wound over the right form appears to be doing fairly well on evaluation today. There is no evidence of infection and he has a small area at the crease of where she is wrist extends and flexes that is still open and appears to be having difficulty due to the fact that it is cracking open. I think this is something that may be controlled with moisture control that is keeping the wound a little bit more moist. Nonetheless this is a tiny region compared to the initial wound and overall appears to be doing very well. No fevers, chills, nausea, or vomiting noted at this time. 11/25/16 on evaluation today patient  appears to be doing well  in regard to his right wrist ulcer. In fact this appears to be completely healed he is having no discomfort. He is pleased with how this is progressed. Electronic Signature(s) [name] ([id number]) Signed: 11/25/2016 12:41:32 PM By: Dustin Keeler [name] Entered By: [name] on 11/25/2016 10:55:44 [name] ([id number]) -------------------------------------------------------------------------------- Physical Exam Details Patient Name: [name] Date of Service: 11/25/2016 10:15 AM Medical Record Number: [id number] Patient Account Number: [id number] Date of Birth/Sex: April 23, 1945 (71 y.o. Male) Treating RN: [name] Primary Care Provider: Owens [name] Other Clinician: Referring Provider: Owens [name] Treating Provider/Extender: [name], [name] Weeks in Treatment: 7 Constitutional Well-nourished and well-hydrated in no acute distress. Respiratory normal breathing without difficulty. Psychiatric this patient is able to make decisions and demonstrates good insight into disease process. Alert and Oriented x 3. pleasant and cooperative. Notes There's no evidence of opening in regard to patient's wound over the rest at this point. Electronic Signature(s) Signed: 11/25/2016 12:41:32 PM By: Dustin Keeler [name] Entered By: [name] on 11/25/2016 10:56:08 [name] ([id number]) -------------------------------------------------------------------------------- Physician Orders Details Patient Name: [name] Date of Service: 11/25/2016 10:15 AM Medical Record Number: [id number] Patient Account Number: [id number] Date of Birth/Sex: September 26, 1945 (71 y.o. Male) Treating RN: [name] Primary Care Provider: Owens [name] Other Clinician: Referring Provider: Owens [name] Treating Provider/Extender: [name], [name] Weeks in Treatment: 7 Verbal / Phone Orders: No Diagnosis Coding ICD-10 Coding Code Description E11.622 Type 2  diabetes mellitus with other skin ulcer S41.101A Unspecified open wound of right upper arm, initial encounter L98.492 Non-pressure chronic ulcer of skin of other sites with fat layer exposed Discharge From [place] o Discharge from [facility] [place] - treatment complete Notes Stretch wrist and moisturize skin daily Electronic Signature(s) Signed: 11/25/2016 12:41:32 PM By: Dustin Keeler [name] Signed: 11/25/2016 1:20:34 PM By: Gretta Cool, BSN, RN, CWS, Kim RN, BSN Entered By: Gretta Cool, BSN, RN, CWS, Kim on 11/25/2016 10:53:42 [name] ([id number]) -------------------------------------------------------------------------------- Problem List Details Patient Name: [name] Date of Service: 11/25/2016 10:15 AM Medical Record Number: [id number] Patient Account Number: [id number] Date of Birth/Sex: [birth year] (71 y.o. Male) Treating RN: [name] Primary Care Provider: Owens [name] Other Clinician: Referring Provider: Owens [name] Treating Provider/Extender: [name], [name] Weeks in Treatment: 7 Active Problems ICD-10 Encounter Code Description Active Date Diagnosis E11.622 Type 2 diabetes mellitus with other skin ulcer 10/04/2016 Yes S41.101A Unspecified open wound of right upper arm, initial 10/04/2016 Yes encounter L98.492 Non-pressure chronic ulcer of skin of other sites with fat 10/04/2016 Yes layer exposed Inactive Problems Resolved Problems Electronic Signature(s) Signed: 11/25/2016 12:41:32 PM By: Dustin Keeler [name] Entered By: [name] on 11/25/2016 10:49:09 [name] ([id number]) -------------------------------------------------------------------------------- Progress Note Details Patient Name: [name] Date of Service: 11/25/2016 10:15 AM Medical Record Number: [id number] Patient Account Number: [id number] Date of Birth/Sex: 1946-01-13 (71 y.o. Male) Treating RN: [name], [name] Primary Care Provider: Owens [name] Other Clinician: Referring Provider: Owens [name] Treating Provider/Extender: [name], [name] Weeks in Treatment: 7 Subjective Chief Complaint Information obtained from Patient Right forearm and hand traumatic ulcer History of Present Illness (HPI) The following HPI elements were documented for the patient's wound: Location: Right forearm and hand Quality: Patient states that he has a mild sore sensation Severity: 0/10 Duration: For approximately 2 weeks prior to initial evaluation in the wound center Timing: With cleansing of the wound or pressure in general Context: Patient s  wound was initiated by him falling into glass causing multiple lacerations though the hand laceration is the worst. Modifying Factors: Patient has recently been on antibiotics for this wound and was soaking it in peroxide for a time. Most recently antibiotic cream has been used Associated Signs and Symptoms: Uncontrolled DM Type II 10/04/16 on evaluation today patient presents for initial evaluation concerning a laceration of the right wrist and hand on the bowler aspect. He tells me that he fell into glass following a medication change in regard to his diabetes. He initially went to [facility] ER where she was sutured and subsequently his daughter who is a [name] removed the teachers at the appropriate time. Unfortunately the wound has done poorly and dehissed which has caused him some trouble including infection. He has been on antibiotics both topically and orally though the wound continues to show signs of necrotic tissue according to notes which is what he was sent to [place] for evaluation concerning. He does have diabetes and unfortunately this is severely uncontrolled. He does see an endocrinologist and is working on this. 10/11/2016 -- the patient's notes from the ER were reviewed and he had his suturing done on 09/17/2016 and had necrotic skin flaps which had to be trimmed the last visit he was  here. He has managed to get his Santyl ointment and has been using this appropriately. 10/21/16 patient's wound appears to be doing very well on evaluation today. He still has some Slough covering the wound bed but this is nowhere near as severe as when i saw this initially two weeks ago nor even my review of his pictures last week. I'm pleased with how this is progressing. Patient's wound over the right form appears to be doing fairly well on evaluation today. There is no evidence of infection and he has a small area at the crease of where she is wrist extends and flexes that is still open and appears to be having difficulty due to the fact that it is cracking open. I think this is something that may be controlled with moisture control that is keeping the wound a little bit more moist. Nonetheless [name], Dustin E. ([id number]) this is a tiny region compared to the initial wound and overall appears to be doing very well. No fevers, chills, nausea, or vomiting noted at this time. 11/25/16 on evaluation today patient appears to be doing well in regard to his right wrist ulcer. In fact this appears to be completely healed he is having no discomfort. He is pleased with how this is progressed. Objective Constitutional Well-nourished and well-hydrated in no acute distress. Vitals Time Taken: 10:41 AM, Height: 70 in, Weight: 241 lbs, BMI: 34.6, Temperature: 98.4 F, Pulse: 76 bpm, Respiratory Rate: 18 breaths/min, Blood Pressure: 135/73 mmHg. Respiratory normal breathing without difficulty. Psychiatric this patient is able to make decisions and demonstrates good insight into disease process. Alert and Oriented x 3. pleasant and cooperative. General Notes: There's no evidence of opening in regard to patient's wound over the rest at this point. Integumentary (Hair, Skin) Wound #1 status is Healed - Epithelialized. Original cause of wound was Trauma. The wound is located on the Right,Posterior Wrist.  The wound measures 0cm length x 0cm width x 0cm depth; 0cm^2 area and 0cm^3 volume. There is Fat Layer (Subcutaneous Tissue) Exposed exposed. There is no tunneling or undermining noted. There is a none present amount of drainage noted. The wound margin is flat and intact. There is large (67-100%) pink granulation within  the wound bed. There is no necrotic tissue within the wound bed. The periwound skin appearance exhibited: Scarring, Erythema. The periwound skin appearance did not exhibit: Callus, Crepitus, Excoriation, Induration, Rash, Dry/Scaly, Maceration, Atrophie Blanche, Cyanosis, Ecchymosis, Hemosiderin Staining, Mottled, Pallor, Rubor. The surrounding wound skin color is noted with erythema which is circumferential. The periwound has tenderness on palpation. Assessment [name], [name] ([id number]) Active Problems ICD-10 E11.622 - Type 2 diabetes mellitus with other skin ulcer S41.101A - Unspecified open wound of right upper arm, initial encounter L98.492 - Non-pressure chronic ulcer of skin of other sites with fat layer exposed Plan Discharge From [facility] Services: Discharge from [place] - treatment complete General Notes: Stretch wrist and moisturize skin daily We will see him for reevaluation as needed in the future. Otherwise we will discontinue wound care services at this time as he no longer needs to follow up with [place]. I did recommend he continue with the moisturizer and continue with stretching in order to loosen the rest up which is obviously tight due to the scar tissue. Otherwise it was a pleasure caring for Mr. Depaz. Electronic Signature(s) Signed: 11/25/2016 12:41:32 PM By: Dustin Keeler [name] Entered By: [name] on 11/25/2016 10:56:59 [name] ([id number]) -------------------------------------------------------------------------------- SuperBill Details Patient Name: [name] Date of Service: 11/25/2016 Medical Record Number:  [id number] Patient Account Number: [id number] Date of Birth/Sex: Jun 08, 1945 (71 y.o. Male) Treating RN: [name] Primary Care Provider: Owens [name] Other Clinician: Referring Provider: Owens [name] Treating Provider/Extender: [name], [name] Weeks in Treatment: 7 Diagnosis Coding ICD-10 Codes Code Description E11.622 Type 2 diabetes mellitus with other skin ulcer S41.101A Unspecified open wound of right upper arm, initial encounter L98.492 Non-pressure chronic ulcer of skin of other sites with fat layer exposed Facility Procedures CPT4 Code: [id number] Description: 42595 - WOUND CARE VISIT-LEV 2 EST PT Modifier: Quantity: 1 Physician Procedures CPT4 Code Description: [id number] 33295 - WC PHYS LEVEL 2 - EST PT ICD-10 Description Diagnosis E11.622 Type 2 diabetes mellitus with other skin ulcer S41.101A Unspecified open wound of right upper arm, initial L98.492 Non-pressure chronic ulcer of skin of  other sites w Modifier: encounter ith fat layer Quantity: 1 exposed Electronic Signature(s) Signed: 11/25/2016 12:41:32 PM By: Dustin Keeler [name] Entered By: [name] on 11/25/2016 10:57:16

## 2016-11-28 NOTE — Progress Notes (Signed)
[name], [name] ([id number]) Visit Report for 11/25/2016 Arrival Information Details Patient Name: [name], [name] Date of Service: 11/25/2016 10:15 AM Medical Record Number: [id number] Patient Account Number: [id number] Date of Birth/Sex: 1946-04-01 (71 y.o. Male) Treating RN: Ahmed Prima Primary Care Emanual Lamountain: [name] Other Clinician: Referring Shaurya Rawdon: [name] Treating Josemiguel Gries/Extender: Melburn Hake, HOYT Weeks in Treatment: 7 Visit Information History Since Last Visit Added or deleted any medications: No Patient Arrived: Cane Any new allergies or adverse reactions: No Arrival Time: 10:37 Had a fall or experienced change in No Accompanied By: self activities of daily living that may affect Transfer Assistance: None risk of falls: Patient Identification Verified: Yes Signs or symptoms of abuse/neglect since last No Secondary Verification Process Completed: Yes visito Patient Requires Transmission-Based No Hospitalized since last visit: No Precautions: Has Dressing in Place as Prescribed: No Patient Has Alerts: Yes Pain Present Now: No Patient Alerts: DMII Electronic Signature(s) Signed: 11/25/2016 2:40:18 PM By: Alric Quan Entered By: Alric Quan on 11/25/2016 10:38:16 [name] ([id number]) -------------------------------------------------------------------------------- Clinic Level of Care Assessment Details Patient Name: [name] Date of Service: 11/25/2016 10:15 AM Medical Record Number: [id number] Patient Account Number: [id number] Date of Birth/Sex: [birth year] (71 y.o. Male) Treating RN: Cornell Barman Primary Care Asaiah Hunnicutt: [name] Other Clinician: Referring Obi Scrima: [name] Treating Marliss Buttacavoli/Extender: Melburn Hake, HOYT Weeks in Treatment: 7 Clinic Level of Care Assessment Items TOOL 4 Quantity Score []  - Use when only an EandM is performed on FOLLOW-UP visit 0 ASSESSMENTS - Nursing Assessment /  Reassessment []  - Reassessment of Co-morbidities (includes updates in patient status) 0 X - Reassessment of Adherence to Treatment Plan 1 5 ASSESSMENTS - Wound and Skin Assessment / Reassessment X - Simple Wound Assessment / Reassessment - one wound 1 5 []  - Complex Wound Assessment / Reassessment - multiple wounds 0 []  - Dermatologic / Skin Assessment (not related to wound area) 0 ASSESSMENTS - Focused Assessment []  - Circumferential Edema Measurements - multi extremities 0 []  - Nutritional Assessment / Counseling / Intervention 0 []  - Lower Extremity Assessment (monofilament, tuning fork, pulses) 0 []  - Peripheral Arterial Disease Assessment (using hand held doppler) 0 ASSESSMENTS - Ostomy and/or Continence Assessment and Care []  - Incontinence Assessment and Management 0 []  - Ostomy Care Assessment and Management (repouching, etc.) 0 PROCESS - Coordination of Care X - Simple Patient / Family Education for ongoing care 1 15 []  - Complex (extensive) Patient / Family Education for ongoing care 0 []  - Staff obtains Programmer, systems, Records, Test Results / Process Orders 0 []  - Staff telephones HHA, Nursing Homes / Clarify orders / etc 0 []  - Routine Transfer to another Facility (non-emergent condition) 0 [name], [name] ([id number]) []  - Routine Hospital Admission (non-emergent condition) 0 []  - New Admissions / Biomedical engineer / Ordering NPWT, Apligraf, etc. 0 []  - Emergency Hospital Admission (emergent condition) 0 X - Simple Discharge Coordination 1 10 []  - Complex (extensive) Discharge Coordination 0 PROCESS - Special Needs []  - Pediatric / Minor Patient Management 0 []  - Isolation Patient Management 0 []  - Hearing / Language / Visual special needs 0 []  - Assessment of Community assistance (transportation, D/C planning, etc.) 0 []  - Additional assistance / Altered mentation 0 []  - Support Surface(s) Assessment (bed, cushion, seat, etc.) 0 INTERVENTIONS - Wound Cleansing /  Measurement X - Simple Wound Cleansing - one wound 1 5 []  - Complex Wound Cleansing - multiple wounds 0 X - Wound Imaging (photographs - any number of wounds) 1  5 []  - Wound Tracing (instead of photographs) 0 X - Simple Wound Measurement - one wound 1 5 []  - Complex Wound Measurement - multiple wounds 0 INTERVENTIONS - Wound Dressings []  - Small Wound Dressing one or multiple wounds 0 []  - Medium Wound Dressing one or multiple wounds 0 []  - Large Wound Dressing one or multiple wounds 0 []  - Application of Medications - topical 0 []  - Application of Medications - injection 0 INTERVENTIONS - Miscellaneous []  - External ear exam 0 [name], BRONER. ([id number]) []  - Specimen Collection (cultures, biopsies, blood, body fluids, etc.) 0 []  - Specimen(s) / Culture(s) sent or taken to Lab for analysis 0 []  - Patient Transfer (multiple staff / Harrel Lemon Lift / Similar devices) 0 []  - Simple Staple / Suture removal (25 or less) 0 []  - Complex Staple / Suture removal (26 or more) 0 []  - Hypo / Hyperglycemic Management (close monitor of Blood Glucose) 0 []  - Ankle / Brachial Index (ABI) - do not check if billed separately 0 X - Vital Signs 1 5 Has the patient been seen at the hospital within the last three years: Yes Total Score: 55 Level Of Care: New/Established - Level 2 Electronic Signature(s) Signed: 11/25/2016 1:20:34 PM By: Gretta Cool, BSN, RN, CWS, Kim RN, BSN Entered By: Gretta Cool, BSN, RN, CWS, Kim on 11/25/2016 10:54:08 [name] ([id number]) -------------------------------------------------------------------------------- Encounter Discharge Information Details Patient Name: [name] Date of Service: 11/25/2016 10:15 AM Medical Record Number: [id number] Patient Account Number: [id number] Date of Birth/Sex: August 01, 1945 (71 y.o. Male) Treating RN: Cornell Barman Primary Care Jerold Yoss: [name] Other Clinician: Referring Kelcie Currie: [name] Treating Piper Albro/Extender:  Melburn Hake, HOYT Weeks in Treatment: 7 Encounter Discharge Information Items Discharge Pain Level: 0 Discharge Condition: Stable Ambulatory Status: Ambulatory Discharge Destination: Home Transportation: Private Auto Accompanied By: self Schedule Follow-up Appointment: Yes Medication Reconciliation completed Yes and provided to Patient/Care [name]: Patient Clinical Summary of Care: Declined Electronic Signature(s) Signed: 11/26/2016 1:46:33 PM By: Ruthine Dose Entered By: Ruthine Dose on 11/25/2016 11:00:26 [name] ([id number]) -------------------------------------------------------------------------------- Multi Wound Chart Details Patient Name: [name] Date of Service: 11/25/2016 10:15 AM Medical Record Number: [id number] Patient Account Number: [id number] Date of Birth/Sex: 07-14-1945 (71 y.o. Male) Treating RN: Cornell Barman Primary Care Zae Kirtz: [name] Other Clinician: Referring Macie Baum: [name] Treating Paw Karstens/Extender: [name], HOYT Weeks in Treatment: 7 Vital Signs Height(in): 70 Pulse(bpm): 76 Weight(lbs): 241 Blood Pressure 135/73 (mmHg): Body Mass Index(BMI): 35 Temperature(F): 98.4 Respiratory Rate 18 (breaths/min): Photos: [1:No Photos] [N/A:N/A] Wound Location: [1:Right, Posterior Wrist] [N/A:N/A] Wounding Event: [1:Trauma] [N/A:N/A] Primary Etiology: [1:Trauma, Other] [N/A:N/A] Comorbid History: [1:Hypertension, Type II Diabetes, Gout, Osteoarthritis, Dementia, Neuropathy] [N/A:N/A] Date Acquired: [1:09/20/2016] [N/A:N/A] Weeks of Treatment: [1:7] [N/A:N/A] Wound Status: [1:Healed - Epithelialized] [N/A:N/A] Measurements L x W x D 0x0x0 [N/A:N/A] (cm) Area (cm) : [1:0] [N/A:N/A] Volume (cm) : [1:0] [N/A:N/A] % Reduction in Area: [1:100.00%] [N/A:N/A] % Reduction in Volume: 100.00% [N/A:N/A] Classification: [1:Full Thickness With Exposed Support Structures] [N/A:N/A] Exudate Amount: [1:None Present]  [N/A:N/A] Wound Margin: [1:Flat and Intact] [N/A:N/A] Granulation Amount: [1:Large (67-100%)] [N/A:N/A] Granulation Quality: [1:Pink] [N/A:N/A] Necrotic Amount: [1:None Present (0%)] [N/A:N/A] Exposed Structures: [1:Fat Layer (Subcutaneous Tissue) Exposed: Yes Fascia: No Tendon: No Muscle: No] [N/A:N/A] Joint: No Bone: No Epithelialization: Large (67-100%) N/A N/A Periwound Skin Texture: Scarring: Yes N/A N/A Excoriation: No Induration: No Callus: No Crepitus: No Rash: No Periwound Skin Maceration: No N/A N/A Moisture: Dry/Scaly: No Periwound Skin Color: Erythema: Yes N/A N/A Atrophie Blanche:  No Cyanosis: No Ecchymosis: No Hemosiderin Staining: No Mottled: No Pallor: No Rubor: No Erythema Location: Circumferential N/A N/A Tenderness on Yes N/A N/A Palpation: Wound Preparation: Ulcer Cleansing: N/A N/A Rinsed/Irrigated with Saline Topical Anesthetic Applied: Other: lidocaine 4% Treatment Notes Electronic Signature(s) Signed: 11/25/2016 1:20:34 PM By: Gretta Cool, BSN, RN, CWS, Kim RN, BSN Entered By: Gretta Cool, BSN, RN, CWS, Kim on 11/25/2016 10:52:59 [name] ([id number]) -------------------------------------------------------------------------------- [place] Details Patient Name: [name] Date of Service: 11/25/2016 10:15 AM Medical Record Number: [id number] Patient Account Number: [id number] Date of Birth/Sex: [birth year] (71 y.o. Male) Treating RN: Cornell Barman Primary Care Socorro Kanitz: [name] Other Clinician: Referring Lisa-Marie Rueger: [name] Treating Tyshell Ramberg/Extender: Worthy Keeler Weeks in Treatment: 7 Active Inactive Electronic Signature(s) Signed: 11/25/2016 1:20:34 PM By: Gretta Cool, BSN, RN, CWS, Kim RN, BSN Entered By: Gretta Cool, BSN, RN, CWS, Kim on 11/25/2016 10:52:49 [name] ([id number]) -------------------------------------------------------------------------------- Pain Assessment Details Patient Name:  [name] Date of Service: 11/25/2016 10:15 AM Medical Record Number: [id number] Patient Account Number: [id number] Date of Birth/Sex: 01-29-1946 (71 y.o. Male) Treating RN: Ahmed Prima Primary Care Tobey Lippard: [name] Other Clinician: Referring Mikhi Athey: [name] Treating Sadi Arave/Extender: Melburn Hake, HOYT Weeks in Treatment: 7 Active Problems Location of Pain Severity and Description of Pain Patient Has Paino No Site Locations Pain Management and Medication Current Pain Management: Electronic Signature(s) Signed: 11/25/2016 2:40:18 PM By: Alric Quan Entered By: Alric Quan on 11/25/2016 10:38:26 [name] ([id number]) -------------------------------------------------------------------------------- Patient/Caregiver Education Details Patient Name: [name] Date of Service: 11/25/2016 10:15 AM Medical Record Number: [id number] Patient Account Number: [id number] Date of Birth/Gender: Apr 30, 1945 (71 y.o. Male) Treating RN: Cornell Barman Primary Care Physician: [name] Other Clinician: Referring Physician: [name] Treating Physician/Extender: Sharalyn Ink in Treatment: 7 Education Assessment Education Provided To: Patient Education Topics Provided Notes Flex wrist and moisturize skin. Electronic Signature(s) Signed: 11/25/2016 1:20:34 PM By: Gretta Cool, BSN, RN, CWS, Kim RN, BSN Entered By: Gretta Cool, BSN, RN, CWS, Kim on 11/25/2016 10:55:22 [name] ([id number]) -------------------------------------------------------------------------------- Wound Assessment Details Patient Name: [name] Date of Service: 11/25/2016 10:15 AM Medical Record Number: [id number] Patient Account Number: [id number] Date of Birth/Sex: 06-Jan-1946 (71 y.o. Male) Treating RN: Cornell Barman Primary Care Carlesha Seiple: [name] Other Clinician: Referring Itzayanna Kaster: [name] Treating Marcelene Weidemann/Extender: Melburn Hake,  HOYT Weeks in Treatment: 7 Wound Status Wound Number: 1 Primary Trauma, Other Etiology: Wound Location: Right, Posterior Wrist Wound Healed - Epithelialized Wounding Event: Trauma Status: Date Acquired: 09/20/2016 Comorbid Hypertension, Type II Diabetes, Gout, Weeks Of Treatment: 7 History: Osteoarthritis, Dementia, Neuropathy Clustered Wound: No Photos Photo Uploaded By: Montey Hora on 11/25/2016 14:00:01 Wound Measurements Length: (cm) 0 % Reduction in Width: (cm) 0 % Reduction in Depth: (cm) 0 Epithelializati Area: (cm) 0 Tunneling: Volume: (cm) 0 Undermining: Area: 100% Volume: 100% on: Large (67-100%) No No Wound Description Full Thickness With Exposed Foul Odor After Classification: Support Structures Slough/Fibrino Wound Margin: Flat and Intact Exudate None Present Amount: Cleansing: No No Wound Bed Granulation Amount: Large (67-100%) Exposed Structure Granulation Quality: Pink Fascia Exposed: No Necrotic Amount: None Present (0%) Fat Layer (Subcutaneous Tissue) Exposed: Yes Tendon Exposed: No Muscle Exposed: No [name], Dustin E. ([id number]) Joint Exposed: No Bone Exposed: No Periwound Skin Texture Texture Color No Abnormalities Noted: No No Abnormalities Noted: No Callus: No Atrophie Blanche: No Crepitus: No Cyanosis: No Excoriation: No Ecchymosis: No Induration: No Erythema: Yes Rash: No Erythema Location: Circumferential Scarring: Yes Hemosiderin Staining: No Mottled: No Moisture  Pallor: No No Abnormalities Noted: No Rubor: No Dry / Scaly: No Maceration: No Temperature / Pain Tenderness on Palpation: Yes Wound Preparation Ulcer Cleansing: Rinsed/Irrigated with Saline Topical Anesthetic Applied: Other: lidocaine 4%, Electronic Signature(s) Signed: 11/25/2016 1:20:34 PM By: Gretta Cool, BSN, RN, CWS, Kim RN, BSN Entered By: Gretta Cool, BSN, RN, CWS, Kim on 11/25/2016 10:52:32 [name]  ([id number]) -------------------------------------------------------------------------------- [place] Details Patient Name: [name] Date of Service: 11/25/2016 10:15 AM Medical Record Number: [id number] Patient Account Number: [id number] Date of Birth/Sex: Oct 14, 1945 (70 y.o. Male) Treating RN: Ahmed Prima Primary Care Evoleth Nordmeyer: [name] Other Clinician: Referring Narely Nobles: [name] Treating Viney Acocella/Extender: Melburn Hake, HOYT Weeks in Treatment: 7 Vital Signs Time Taken: 10:41 Temperature (F): 98.4 Height (in): 70 Pulse (bpm): 76 Weight (lbs): 241 Respiratory Rate (breaths/min): 18 Body Mass Index (BMI): 34.6 Blood Pressure (mmHg): 135/73 Reference Range: 80 - 120 mg / dl Electronic Signature(s) Signed: 11/25/2016 2:40:18 PM By: Alric Quan Entered By: Alric Quan on 11/25/2016 10:42:03

## 2016-11-29 ENCOUNTER — Encounter: Payer: Self-pay | Admitting: Internal Medicine

## 2016-11-29 ENCOUNTER — Ambulatory Visit (INDEPENDENT_AMBULATORY_CARE_PROVIDER_SITE_OTHER): Payer: Medicare Other | Admitting: Internal Medicine

## 2016-11-29 VITALS — BP 132/70 | HR 90 | Wt 241.0 lb

## 2016-11-29 DIAGNOSIS — E781 Pure hyperglyceridemia: Secondary | ICD-10-CM | POA: Diagnosis not present

## 2016-11-29 DIAGNOSIS — IMO0002 Reserved for concepts with insufficient information to code with codable children: Secondary | ICD-10-CM

## 2016-11-29 DIAGNOSIS — E1142 Type 2 diabetes mellitus with diabetic polyneuropathy: Secondary | ICD-10-CM

## 2016-11-29 DIAGNOSIS — E1165 Type 2 diabetes mellitus with hyperglycemia: Secondary | ICD-10-CM

## 2016-11-29 NOTE — Patient Instructions (Addendum)
Please increase:  Type of insulin Breakfast Lunch Dinner  Humulin R 35  35  Humulin N 35  35   Please return in 1.5 months with your sugar log.

## 2016-11-29 NOTE — Progress Notes (Signed)
Patient ID: [name], male   DOB: 11/03/1945, 71 y.o.   [name]: [id number]  HPI: [name] is a 71 y.o.-year-old male, returning for f/u for DM2, dx 1997, insulin-dependent since 2010, uncontrolled, with complications (CKD stage 3, PN, ED, TIA). Last visit 3.5 mo ago.  He became dizzy on the U500 (but no hypoglycemia) >> fell through a glass cabinet door >> cut hand/wrist/axila and almost sliced his throat. Now recovered.  U500 is not covered by his insurance anymore. At last visit, I gave him a protocol for mixing insulins N and R and he is using these insulins now, however, at much lower doses than recommended >> unclear why.  Last hemoglobin A1c was: Lab Results  Component Value Date   HGBA1C 10.3 08/16/2016   HGBA1C 9.7 05/11/2016   HGBA1C 9.4 (H) 06/05/2015   He was on:  U500 insulin: - draw up to 20 units on the syringe before b'fast (100 units) - draw up to 10 (not 12) units on the syringe before lunch (60 units) - draw up to 20 (sometimes 25) units on the syringe before dinner (100 units)  He is now on - using MUCH lower doses that recommended: Type of insulin Breakfast Lunch Dinner  Humulin R 50 >> 25 (?)  50 >> 25  Humulin N 50 >> 25 (?)  50 >> 25    Pt checks his sugars 2x a day:: - am:71, 96, 145-356, 458 >> 100-407, 541 >> 80, 165-394 >> 72, 177-400 - 2h after b'fast: n/c  - before lunch: n/c >> 80, 114-289 >> n/c - 2h after lunch: n/c >> 300s >> n/c - before dinner:  70 (no lunch), 150 >> 364 >> 198, 335 >> n/c - 2h after dinner: n/c - bedtime: 114, 250-406, 461 >> 208-477 >> 88-387, 416 >> 80, 137-399 - nighttime: 60s, wakes him up >> n/c >> 286 >> n/c No lows since last visit, lowest 80 >> 72.  he has hypoglycemia awareness at 100.  Highest sugar was 416 >> 400.  Pt's meals are (stopped meat) - Breakfast: 3 pancakes with diet syrup - Lunch: tuna or cheese sandwich + chips - Dinner: veggies + starch - Snacks: 4-5 x a day Eats dinner: 7-7:30 pm.  Bedtime 2-4 am.  - he has CKD, last BUN/creatinine:  Lab Results  Component Value Date   BUN 24 (H) 09/20/2016   CREATININE 2.07 (H) 09/20/2016  Last ACR 4 on 12/12/2012. On Lisinopril 20. - last set of lipids: Lab Results  Component Value Date   CHOL 147 05/13/2016   HDL 27.40 (L) 05/13/2016   LDLCALC 43 04/24/2009   LDLDIRECT 46.0 05/13/2016   TRIG (H) 05/13/2016    698.0 Triglyceride is over 400; calculations on Lipids are invalid.   CHOLHDL 5 05/13/2016  On Zocor 40 - last eye exam was in 03/[facility]. No DR. + Cataract surgeries.. - he has numbness and tingling in his feet, especially at night.  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  ROS: Constitutional: no weight gain/no weight loss, no fatigue, no subjective hyperthermia, no subjective hypothermia Eyes: no blurry vision, no xerophthalmia ENT: no sore throat, no nodules palpated in throat, no dysphagia, no odynophagia, no hoarseness Cardiovascular: no CP/no SOB/no palpitations/no leg swelling Respiratory: no cough/no SOB/no wheezing Gastrointestinal: no N/no V/no D/no C/no acid reflux Musculoskeletal: + muscle aches/+ joint aches Skin: no rashes, no hair loss Neurological: no tremors/+ numbness/+ tingling/no dizziness + diff. with  erections  I reviewed pt's medications, allergies, PMH, social hx, family hx, and changes were documented in the history of present illness. Otherwise, unchanged from my initial visit note.  PE: BP 132/70 (BP Location: Left Arm, Patient Position: Sitting)   Pulse 90   Wt 241 lb (109.3 kg)   SpO2 94%   BMI 33.61 kg/m  Body mass index is 33.61 kg/m. Wt Readings from Last 3 Encounters:  11/29/16 241 lb (109.3 kg)  11/04/16 245 lb 8 oz (111.4 kg)  10/28/16 241 lb 8 oz (109.5 kg)   Constitutional: overweight, in NAD Eyes: PERRLA, EOMI, no exophthalmos ENT: moist mucous membranes, no thyromegaly, no cervical lymphadenopathy Cardiovascular: RRR, No  MRG Respiratory: CTA B Gastrointestinal: abdomen soft, NT, ND, BS+ Musculoskeletal: no deformities, strength intact in all 4 Skin: moist, warm, no rashes Neurological: no tremor with outstretched hands, DTR normal in all 4   ASSESSMENT: 1. DM2, insulin-dependent, uncontrolled, with complications - noncompliance with visits, insulin doses, CBG checks - CKD stage 3 - PN - ED - TIA  2. HTG  PLAN:  1. Patient with long-standing, uncontrolled diabetes, with poor control, but with slightly better sugars for approximately a month this summer. During this time, he mentions that "I have been good", meaning that he reduced the amount of snacks that he gets at night. He tells me that he usually finishes her back of potato chips, but during this period, he only had a bowl. We discussed at length about the need to not even buy his type of snacks, since they will greatly exacerbated not only his diabetes but also his hypertriglyceridemia. - Since sugars are still high, I will go ahead and increase his insulin doses. - He only has 2 meals a day, so we will stick with 2 insulin injections a day (he mixes the insulins in the same column) - I advised him to: Patient Instructions   Please increase:  Type of insulin Breakfast Lunch Dinner  Humulin R 35  35  Humulin N 35  35   Please return in 1.5 months with your sugar log.   - today, HbA1c is 9.5% (lower) - continue checking sugars at different times of the day - check 3x a day, rotating checks - advised for yearly eye exams >> he is UTD - Return to clinic in 3 mo with sugar log   2. HTG - History triglycerides are quite high at last check, in 05/2016 - He will continue on his statin, and he may need fenofibrate - For now, we discussed about cutting out the fatty and sugary snacks   Philemon Kingdom, MD PhD [facility] Endocrinology

## 2016-11-30 LAB — POCT GLYCOSYLATED HEMOGLOBIN (HGB A1C): Hemoglobin A1C: 9.5

## 2016-11-30 NOTE — Addendum Note (Signed)
Addended by: Caprice Beaver T on: 11/30/2016 07:56 AM   Modules accepted: Orders

## 2016-12-06 ENCOUNTER — Other Ambulatory Visit: Payer: Self-pay | Admitting: Family Medicine

## 2016-12-06 NOTE — Telephone Encounter (Signed)
Last office visit 11/04/2016 with Dr. Damita Dunnings for muscle spasm.  Last refilled 06/14/2016 for 10 ml with 1 refill.  Ok to refill?

## 2016-12-06 NOTE — Telephone Encounter (Signed)
Rx faxed to [facility] [phone].

## 2016-12-09 ENCOUNTER — Telehealth (INDEPENDENT_AMBULATORY_CARE_PROVIDER_SITE_OTHER): Payer: Medicare Other

## 2016-12-09 ENCOUNTER — Ambulatory Visit: Payer: Medicare Other

## 2016-12-09 ENCOUNTER — Other Ambulatory Visit (INDEPENDENT_AMBULATORY_CARE_PROVIDER_SITE_OTHER): Payer: Medicare Other

## 2016-12-09 ENCOUNTER — Ambulatory Visit (INDEPENDENT_AMBULATORY_CARE_PROVIDER_SITE_OTHER): Payer: Medicare Other

## 2016-12-09 VITALS — BP 134/68 | HR 66 | Temp 98.0°F | Ht 71.25 in | Wt 242.2 lb

## 2016-12-09 DIAGNOSIS — N521 Erectile dysfunction due to diseases classified elsewhere: Secondary | ICD-10-CM

## 2016-12-09 DIAGNOSIS — Z125 Encounter for screening for malignant neoplasm of prostate: Secondary | ICD-10-CM | POA: Diagnosis not present

## 2016-12-09 DIAGNOSIS — I10 Essential (primary) hypertension: Secondary | ICD-10-CM

## 2016-12-09 DIAGNOSIS — E1169 Type 2 diabetes mellitus with other specified complication: Secondary | ICD-10-CM | POA: Diagnosis not present

## 2016-12-09 DIAGNOSIS — E781 Pure hyperglyceridemia: Secondary | ICD-10-CM | POA: Diagnosis not present

## 2016-12-09 DIAGNOSIS — E349 Endocrine disorder, unspecified: Secondary | ICD-10-CM | POA: Diagnosis not present

## 2016-12-09 DIAGNOSIS — Z0000 Encounter for general adult medical examination without abnormal findings: Secondary | ICD-10-CM

## 2016-12-09 LAB — CBC WITH DIFFERENTIAL/PLATELET
BASOS PCT: 0.4 % (ref 0.0–3.0)
Basophils Absolute: 0 10*3/uL (ref 0.0–0.1)
EOS PCT: 1.2 % (ref 0.0–5.0)
Eosinophils Absolute: 0.1 10*3/uL (ref 0.0–0.7)
HEMATOCRIT: 43.8 % (ref 39.0–52.0)
Hemoglobin: 15.4 g/dL (ref 13.0–17.0)
LYMPHS PCT: 22.5 % (ref 12.0–46.0)
Lymphs Abs: 1.7 10*3/uL (ref 0.7–4.0)
MCHC: 35 g/dL (ref 30.0–36.0)
MCV: 91.4 fl (ref 78.0–100.0)
MONOS PCT: 11.4 % (ref 3.0–12.0)
Monocytes Absolute: 0.8 10*3/uL (ref 0.1–1.0)
NEUTROS ABS: 4.8 10*3/uL (ref 1.4–7.7)
Neutrophils Relative %: 64.5 % (ref 43.0–77.0)
PLATELETS: 188 10*3/uL (ref 150.0–400.0)
RBC: 4.8 Mil/uL (ref 4.22–5.81)
RDW: 13.2 % (ref 11.5–15.5)
WBC: 7.5 10*3/uL (ref 4.0–10.5)

## 2016-12-09 LAB — BASIC METABOLIC PANEL
BUN: 25 mg/dL — ABNORMAL HIGH (ref 6–23)
CHLORIDE: 96 meq/L (ref 96–112)
CO2: 32 mEq/L (ref 19–32)
CREATININE: 1.71 mg/dL — AB (ref 0.40–1.50)
Calcium: 9.4 mg/dL (ref 8.4–10.5)
GFR: 42.14 mL/min — ABNORMAL LOW (ref >60.00)
Glucose, Bld: 146 mg/dL — ABNORMAL HIGH (ref 70–99)
Potassium: 4.8 mEq/L (ref 3.5–5.1)
SODIUM: 135 meq/L (ref 135–145)

## 2016-12-09 LAB — HEPATIC FUNCTION PANEL
ALBUMIN: 4.3 g/dL (ref 3.5–5.2)
ALK PHOS: 69 U/L (ref 39–117)
ALT: 13 U/L (ref 0–53)
AST: 12 U/L (ref 0–37)
Bilirubin, Direct: 0.1 mg/dL (ref 0.0–0.3)
TOTAL PROTEIN: 6.8 g/dL (ref 6.0–8.3)
Total Bilirubin: 0.4 mg/dL (ref 0.2–1.2)

## 2016-12-09 LAB — LIPID PANEL
Cholesterol: 188 mg/dL (ref 0–200)
HDL: 30.9 mg/dL — ABNORMAL LOW (ref >39.00)
Total CHOL/HDL Ratio: 6
Triglycerides: 1187 mg/dL — ABNORMAL HIGH (ref 0.0–149.0)

## 2016-12-09 LAB — LDL CHOLESTEROL, DIRECT: LDL DIRECT: 66 mg/dL

## 2016-12-09 LAB — PSA, MEDICARE: PSA: 0.78 ng/mL (ref 0.10–4.00)

## 2016-12-09 NOTE — Progress Notes (Signed)
PCP notes:   Health maintenance:  Foot exam - PCP please address at next appt Flu vaccine - addressed A1C - per patient, lab in 11/2016 (result 9.1)  Abnormal screenings:   Hearing - failed Fall risk - multiple falls with injury; related to medication Depression score: 6  Patient concerns:   None  Nurse concerns:  Testosterone injection administered as ordered.  Next PCP appt:   12/22/16 @ 1130

## 2016-12-09 NOTE — Telephone Encounter (Signed)
Labs ordered per written email from provider.

## 2016-12-09 NOTE — Progress Notes (Signed)
Subjective:   [name] is a 71 y.o. male who presents for Medicare Annual/Subsequent preventive examination.  Review of Systems:  N/A Cardiac Risk Factors include: advanced age (>65men, >62 women);obesity (BMI >30kg/m2);male gender;diabetes mellitus;dyslipidemia;hypertension     Objective:    Vitals: BP 134/68 (BP Location: Right Arm, Patient Position: Sitting, Cuff Size: Normal)   Pulse 66   Temp 98 F (36.7 C) (Oral)   Ht 5' 11.25" (1.81 m) Comment: no shoes  Wt 242 lb 4 oz (109.9 kg)   SpO2 95%   BMI 33.55 kg/m   Body mass index is 33.55 kg/m.  Tobacco History  Smoking Status  . Former Smoker  . Packs/day: 3.00  . Years: 35.00  . Types: Cigarettes  . Quit date: 04/12/2000  Smokeless Tobacco  . Never Used     Counseling given: No   Past Medical History:  Diagnosis Date  . Allergic rhinitis due to pollen   . Chronic kidney disease, stage III (moderate) 12/19/2012  . Depression   . Diabetes mellitus ([place])   . Diverticulosis   . Erectile dysfunction associated with type 2 diabetes mellitus ([place]) 05/28/2015  . Former very heavy cigarette smoker (more than 40 per day) 10/07/2014  . GERD (gastroesophageal reflux disease)   . Gout   . Headache   . Hyperlipidemia   . Hypertension   . Hypogonadism male 06/14/2012  . Iron deficiency   . Low testosterone   . Memory loss   . Osteoarthritis   . Personal history of colonic polyps    1996   Past Surgical History:  Procedure Laterality Date  . CARDIAC CATHETERIZATION  11/2011   [facility]  . CHOLECYSTECTOMY  1980  . TOTAL HIP ARTHROPLASTY  2009   Dr. Gladstone Lighter  . TOTAL HIP ARTHROPLASTY     Family History  Problem Relation Age of Onset  . Diabetes Mother   . Alzheimer's disease Mother   . Diabetes Father   . Lung cancer Father        Age 29  . Stroke Father   . Heart attack Father   . Lung cancer Sister        lung  . Heart disease Maternal Grandfather   . COPD Sister   . Heart attack Sister   . Heart  disease Sister    History  Sexual Activity  . Sexual activity: No    Outpatient Encounter Prescriptions as of 12/09/2016  Medication Sig  . amoxicillin (AMOXIL) 500 MG capsule Take 500 mg by mouth daily. #30 filled 08/26/16  . aspirin EC 81 MG tablet Take 81 mg by mouth at bedtime.  [name] Kitchen atenolol (TENORMIN) 100 MG tablet TAKE ONE TABLET BY MOUTH ONCE DAILY  . buPROPion (WELLBUTRIN SR) 150 MG 12 hr tablet Take 1 tablet (150 mg total) by mouth 2 (two) times daily.  . cholecalciferol (VITAMIN D) 1000 UNITS tablet Take 1,000 Units by mouth daily.    [name] Kitchen donepezil (ARICEPT) 10 MG tablet Take 1 tablet (10 mg total) by mouth at bedtime.  [name] Kitchen doxazosin (CARDURA) 8 MG tablet TAKE ONE TABLET BY MOUTH AT BEDTIME  . fluticasone (FLONASE) 50 MCG/ACT nasal spray INHALE 2 SPRAYS INTO EACH NOSTRIL EVERY DAY AS NEEDED (Patient taking differently: INHALE 2 SPRAYS INTO EACH NOSTRIL EVERY DAY AS NEEDED FOR CONGESTION)  . gabapentin (NEURONTIN) 300 MG capsule Take 1 capsule (300 mg total) by mouth 2 (two) times daily.  [name] Kitchen ibuprofen (ADVIL,MOTRIN) 200 MG tablet Take 200-400 mg by mouth every  6 (six) hours as needed for headache (pain).  . insulin NPH Human (HUMULIN N,NOVOLIN N) 100 UNIT/ML injection Inject 35 Units into the skin 2 (two) times daily before a meal.  . insulin regular (NOVOLIN R,HUMULIN R) 100 units/mL injection Inject 35 Units into the skin 2 (two) times daily before a meal.  . lisinopril-hydrochlorothiazide (PRINZIDE,ZESTORETIC) 20-12.5 MG tablet Take 1 tablet by mouth daily.  . memantine (NAMENDA) 10 MG tablet Take 1 tablet (10 mg total) by mouth 2 (two) times daily.  . ranitidine (ZANTAC 75) 75 MG tablet Take 225 mg by mouth at bedtime.   . sildenafil (REVATIO) 20 MG tablet Generic Revatio / Sildanefil 20 mg. 2 - 5 tabs 30 mins prior to intercourse. (Patient taking differently: Take 80-100 mg by mouth daily as needed (erectile dysfunction - 30 minutes prior to intercourse). )  . simvastatin (ZOCOR) 40 MG  tablet TAKE ONE TABLET BY MOUTH AT BEDTIME  . testosterone cypionate (DEPOTESTOSTERONE CYPIONATE) 200 MG/ML injection INJECT 0.75 MLS (150 MG) INTO THE MUSCLEAS DIRECTED  . venlafaxine XR (EFFEXOR-XR) 75 MG 24 hr capsule TAKE THREE (3) CAPSULES BY MOUTH DAILY (Patient taking differently: TAKE ONE CAPSULE (75 MG) BY MOUTH EVERY MORNING AND TWO CAPSULES (150 MG) AT NIGHT)   Facility-Administered Encounter Medications as of 12/09/2016  Medication  . testosterone cypionate (DEPOTESTOSTERONE CYPIONATE) injection 150 mg  . testosterone cypionate (DEPOTESTOSTERONE CYPIONATE) injection 150 mg    Activities of Daily Living In your present state of health, do you have any difficulty performing the following activities: 12/09/2016  Hearing? N  Vision? N  Difficulty concentrating or making decisions? Y  Walking or climbing stairs? Y  Comment lower back pain when climbing stairs or walking long distances  Dressing or bathing? N  Doing errands, shopping? N  Preparing Food and eating ? N  Using the Toilet? N  In the past six months, have you accidently leaked urine? N  Do you have problems with loss of bowel control? N  Managing your Medications? N  Managing your Finances? N  Housekeeping or managing your Housekeeping? N  Some recent data might be hidden    Patient Care Team: [name], MD as PCP - General Carlean Purl Ofilia Neas, MD as Consulting Physician (Gastroenterology) Philemon Kingdom, MD as Consulting Physician (Internal Medicine)   Assessment:     Hearing Screening   125Hz  250Hz  500Hz  1000Hz  2000Hz  3000Hz  4000Hz  6000Hz  8000Hz   Right ear:   40 40 40  0    Left ear:   40 40 40  0    Vision Screening Comments: Last vision exam in July 2018 with Dr. Manuella Ghazi   Exercise Activities and Dietary recommendations Current Exercise Habits: The patient does not participate in regular exercise at present, Exercise limited by: orthopedic condition(s)  Goals    . Eat more fruits and vegetables           Starting 12/09/2016, I will continue to eat at least 3 servings of fresh fruits and vegetables.       Fall Risk Fall Risk  12/09/2016 08/26/2015 12/19/2013 12/18/2012  Falls in the past year? Yes Yes Yes No  Comment - pt had a mini-stroke prior to fall - -  Number falls in past yr: 2 or more 1 1 -  Injury with Fall? Yes No No -  Risk for fall due to : Medication side effect - - -  Follow up - Falls evaluation completed - -   Depression Screen PHQ 2/9 Scores 12/09/2016  08/26/2015 12/19/2013 12/18/2012  PHQ - 2 Score 2 2 0 1  PHQ- 9 Score 6 - - -    Cognitive Function MMSE - Mini Mental State Exam 12/09/2016 10/28/2016 06/29/2016 08/26/2015  Orientation to time 5 5 5 5   Orientation to Place 5 5 5 5   Registration 3 3 3 3   Attention/ Calculation 0 5 3 0  Recall 3 2 2 3   Language- name 2 objects 0 2 2 0  Language- repeat 1 1 1 1   Language- follow 3 step command 3 3 3 3   Language- read & follow direction 0 1 1 0  Write a sentence 0 1 1 0  Copy design 0 1 1 0  Total score 20 29 27 20    PLEASE NOTE: A Mini-Cog screen was completed. Maximum score is 20. A value of 0 denotes this part of [place] MMSE was not completed or the patient failed this part of the Mini-Cog screening.   Mini-Cog Screening Orientation to Time - Max 5 pts Orientation to Place - Max 5 pts Registration - Max 3 pts Recall - Max 3 pts Language Repeat - Max 1 pts Language Follow 3 Step Command - Max 3 pts     Immunization History  Administered Date(s) Administered  . Influenza Split 12/29/2010  . Influenza Whole 01/27/2009, 02/05/2010  . Influenza, Seasonal, Injecte, Preservative Fre 06/14/2012  . Influenza,inj,Quad PF,6+ Mos 12/18/2012, 12/19/2013, 03/19/2015, 01/05/2016  . Pneumococcal Conjugate-13 12/19/2013  . Pneumococcal Polysaccharide-23 10/25/2008, 08/26/2015  . Td 04/13/2007   Screening Tests Health Maintenance  Topic Date Due  . FOOT EXAM  12/22/2016 (Originally 03/26/2016)  . INFLUENZA VACCINE   07/10/2017 (Originally 11/10/2016)  . TETANUS/TDAP  04/12/2017  . HEMOGLOBIN A1C  06/03/2017  . OPHTHALMOLOGY EXAM  06/23/2017  . COLONOSCOPY  09/15/2020  . Hepatitis C Screening  Completed  . PNA vac Low Risk Adult  Completed      Plan:     I have personally reviewed and addressed the Medicare Annual Wellness questionnaire and have noted the following in the patient's chart:  A. Medical and social history B. Use of alcohol, tobacco or illicit drugs  C. Current medications and supplements D. Functional ability and status E.  Nutritional status F.  Physical activity G. Advance directives H. List of other physicians I.  Hospitalizations, surgeries, and ER visits in previous 12 months J.  [place] to include hearing, vision, cognitive, depression L. Referrals and appointments - none  In addition, I have reviewed and discussed with patient certain preventive protocols, quality metrics, and best practice recommendations. A written personalized care plan for preventive services as well as general preventive health recommendations were provided to patient.  See attached scanned questionnaire for additional information.   Signed,   Lindell Noe, MHA, BS, LPN Health Coach

## 2016-12-09 NOTE — Patient Instructions (Signed)
[name] , Thank you for taking time to come for your Medicare Wellness Visit. I appreciate your ongoing commitment to your health goals. Please review the following plan we discussed and let me know if I can assist you in the future.   These are the goals we discussed: Goals    . Eat more fruits and vegetables          Starting 12/09/2016, I will continue to eat at least 3 servings of fresh fruits and vegetables.        This is a list of the screening recommended for you and due dates:  Health Maintenance  Topic Date Due  . Complete foot exam   12/22/2016*  . Flu Shot  07/10/2017*  . Tetanus Vaccine  04/12/2017  . Hemoglobin A1C  06/03/2017  . Eye exam for diabetics  06/23/2017  . Colon Cancer Screening  09/15/2020  .  Hepatitis C: One time screening is recommended by Center for Disease Control  (CDC) for  adults born from [birth year] through 1965.   Completed  . Pneumonia vaccines  Completed  *Topic was postponed. The date shown is not the original due date.   Preventive Care for Adults  A healthy lifestyle and preventive care can promote health and wellness. Preventive health guidelines for adults include the following key practices.  . A routine yearly physical is a good way to check with your health care provider about your health and preventive screening. It is a chance to share any concerns and updates on your health and to receive a thorough exam.  . Visit your dentist for a routine exam and preventive care every 6 months. Brush your teeth twice a day and floss once a day. Good oral hygiene prevents tooth decay and gum disease.  . The frequency of eye exams is based on your age, health, family medical history, use  of contact lenses, and other factors. Follow your health care provider's ecommendations for frequency of eye exams.  . Eat a healthy diet. Foods like vegetables, fruits, whole grains, low-fat dairy products, and lean protein foods contain the nutrients you need  without too many calories. Decrease your intake of foods high in solid fats, added sugars, and salt. Eat the right amount of calories for you. Get information about a proper diet from your health care provider, if necessary.  . Regular physical exercise is one of the most important things you can do for your health. Most adults should get at least 150 minutes of moderate-intensity exercise (any activity that increases your heart rate and causes you to sweat) each week. In addition, most adults need muscle-strengthening exercises on 2 or more days a week.  Silver Sneakers may be a benefit available to you. To determine eligibility, you may visit the website: www.silversneakers.com or contact program at [phone])868-4278 Mon-Fri between 8AM-8PM.   . Maintain a healthy weight. The body mass index (BMI) is a screening tool to identify possible weight problems. It provides an estimate of body fat based on height and weight. Your health care provider can find your BMI and can help you achieve or maintain a healthy weight.   For adults 20 years and older: ? A BMI below 18.5 is considered underweight. ? A BMI of 18.5 to 24.9 is normal. ? A BMI of 25 to 29.9 is considered overweight. ? A BMI of 30 and above is considered obese.   . Maintain normal blood lipids and cholesterol levels by exercising and minimizing your intake  of saturated fat. Eat a balanced diet with plenty of fruit and vegetables. Blood tests for lipids and cholesterol should begin at age 68 and be repeated every 5 years. If your lipid or cholesterol levels are high, you are over 50, or you are at high risk for heart disease, you may need your cholesterol levels checked more frequently. Ongoing high lipid and cholesterol levels should be treated with medicines if diet and exercise are not working.  . If you smoke, find out from your health care provider how to quit. If you do not use tobacco, please do not start.  . If you choose to drink  alcohol, please do not consume more than 2 drinks per day. One drink is considered to be 12 ounces (355 mL) of beer, 5 ounces (148 mL) of wine, or 1.5 ounces (44 mL) of liquor.  . If you are 37-16 years old, ask your health care provider if you should take aspirin to prevent strokes.  . Use sunscreen. Apply sunscreen liberally and repeatedly throughout the day. You should seek shade when your shadow is shorter than you. Protect yourself by wearing long sleeves, pants, a wide-brimmed hat, and sunglasses year round, whenever you are outdoors.  . Once a month, do a whole body skin exam, using a mirror to look at the skin on your back. Tell your health care provider of new moles, moles that have irregular borders, moles that are larger than a pencil eraser, or moles that have changed in shape or color.

## 2016-12-09 NOTE — Progress Notes (Signed)
Addendum created because encounter was closed in error.

## 2016-12-09 NOTE — Progress Notes (Signed)
Pre visit review using our clinic review tool, if applicable. No additional management support is needed unless otherwise documented below in the visit note. 

## 2016-12-10 NOTE — Progress Notes (Signed)
I reviewed health advisor's note, was available for consultation, and agree with documentation and plan.   Signed,  Rayane Gallardo T. Cambryn Charters, MD  

## 2016-12-19 LAB — TESTOS,TOTAL,FREE AND SHBG (FEMALE)
Sex Hormone Binding Glob.: 13 nmol/L — ABNORMAL LOW (ref 22–77)
Testosterone, Free: 72 pg/mL (ref 30.0–135.0)
Testosterone,Total,LC/MS/MS: 233 ng/dL — ABNORMAL LOW (ref 250–1100)

## 2016-12-22 ENCOUNTER — Ambulatory Visit (INDEPENDENT_AMBULATORY_CARE_PROVIDER_SITE_OTHER): Payer: Medicare Other | Admitting: Family Medicine

## 2016-12-22 ENCOUNTER — Encounter: Payer: Self-pay | Admitting: Family Medicine

## 2016-12-22 VITALS — BP 100/60 | HR 124 | Temp 97.6°F | Ht 71.25 in | Wt 242.0 lb

## 2016-12-22 DIAGNOSIS — E1165 Type 2 diabetes mellitus with hyperglycemia: Secondary | ICD-10-CM | POA: Diagnosis not present

## 2016-12-22 DIAGNOSIS — F3341 Major depressive disorder, recurrent, in partial remission: Secondary | ICD-10-CM

## 2016-12-22 DIAGNOSIS — E291 Testicular hypofunction: Secondary | ICD-10-CM

## 2016-12-22 DIAGNOSIS — E1142 Type 2 diabetes mellitus with diabetic polyneuropathy: Secondary | ICD-10-CM | POA: Diagnosis not present

## 2016-12-22 DIAGNOSIS — I10 Essential (primary) hypertension: Secondary | ICD-10-CM | POA: Diagnosis not present

## 2016-12-22 DIAGNOSIS — E781 Pure hyperglyceridemia: Secondary | ICD-10-CM

## 2016-12-22 DIAGNOSIS — Z87891 Personal history of nicotine dependence: Secondary | ICD-10-CM | POA: Diagnosis not present

## 2016-12-22 DIAGNOSIS — IMO0002 Reserved for concepts with insufficient information to code with codable children: Secondary | ICD-10-CM

## 2016-12-22 NOTE — Progress Notes (Signed)
Dr. Frederico Hamman T. Sharaine Delange, MD, [facility] [place], [address] Phone: [phone] Fax: [phone]  12/22/2016  Patient: [name], [name]: [id number], DOB: 07-19-1945, 71 y.o.  Primary Physician:  [name], MD   Chief Complaint  Patient presents with  . Annual Exam    Part 2   Subjective:   [name] is a 71 y.o. very pleasant male patient who presents with the following:  HTN: Tolerating all medications without side effects Stable and at goal No CP, no sob. No HA.  BP Readings from Last 3 Encounters:  12/22/16 100/60  12/09/16 134/68  11/29/16 465/03    Basic Metabolic Panel:    Component Value Date/Time   NA 135 12/09/2016 0855   NA 130 (L) 11/25/2011 1119   NA 137 11/16/2011 0513   K 4.8 12/09/2016 0855   K 4.1 11/16/2011 0513   CL 96 12/09/2016 0855   CL 100 11/16/2011 0513   CO2 32 12/09/2016 0855   CO2 29 11/16/2011 0513   BUN 25 (H) 12/09/2016 0855   BUN 25 11/25/2011 1119   BUN 15 11/16/2011 0513   CREATININE 1.71 (H) 12/09/2016 0855   CREATININE 1.26 11/16/2011 0513   GLUCOSE 146 (H) 12/09/2016 0855   GLUCOSE 239 (H) 11/16/2011 0513   CALCIUM 9.4 12/09/2016 0855   CALCIUM 9.1 11/16/2011 0513    Lipids: Trigs very high.  Tolerating meds fine with no SE. Panel reviewed with patient.  Lipids:    Component Value Date/Time   CHOL 188 12/09/2016 0855   TRIG (H) 12/09/2016 0855    1187.0 Triglyceride is over 400; calculations on Lipids are invalid.   HDL 30.90 (L) 12/09/2016 0855   LDLDIRECT 66.0 12/09/2016 0855   VLDL 127.0 (H) 12/12/2013 0902   CHOLHDL 6 12/09/2016 0855    Lab Results  Component Value Date   ALT 13 12/09/2016   AST 12 12/09/2016   ALKPHOS 69 12/09/2016   BILITOT 0.4 12/09/2016    Diabetes Mellitus: Tolerating Medications: yes Compliance with diet: fair Exercise: minimal / intermittent Foot problems: none Hypoglycemia: none No nausea,  vomitting, blurred vision, polyuria.  Lab Results  Component Value Date   HGBA1C 9.5 11/30/2016   HGBA1C 10.3 08/16/2016   HGBA1C 9.7 05/11/2016   Lab Results  Component Value Date   MICROALBUR 4.0 (H) 12/12/2013   LDLCALC 43 04/24/2009   CREATININE 1.71 (H) 12/09/2016    Wt Readings from Last 3 Encounters:  12/22/16 242 lb (109.8 kg)  12/09/16 242 lb 4 oz (109.9 kg)  11/29/16 241 lb (109.3 kg)    Body mass index is 33.52 kg/m.   Last free test 70's Lab Results  Component Value Date   PSA 0.78 12/09/2016   PSA 0.75 05/13/2016   PSA 0.38 12/12/2013     Past Medical History, Surgical History, Social History, Family History, Problem List, Medications, and Allergies have been reviewed and updated if relevant.  Patient Active Problem List   Diagnosis Date Noted  . Uncontrolled type 2 diabetes mellitus with peripheral neuropathy ([place]) 05/11/2016    Priority: Medium  . Former very heavy cigarette smoker (more than 40 per day) 10/07/2014    Priority: Medium  . Chronic kidney disease, stage III (moderate) 12/19/2012    Priority: Medium  . OSA (obstructive sleep apnea) 03/09/2011    Priority: Medium  . Diabetic neuropathy ([place]) 12/29/2010    Priority: Medium  . Hypertriglyceridemia 06/13/2008  Priority: Medium  . Major depressive disorder, recurrent episode, in partial remission ([place]) 06/13/2008    Priority: Medium  . Essential hypertension 06/13/2008    Priority: Medium  . Muscle spasm 11/05/2016  . Unsteady gait 10/10/2016  . Wound infection, posttraumatic 10/02/2016  . Hand, open wound except fingers, complicated, right, subsequent encounter 10/01/2016  . Mild cognitive disorder 06/29/2016  . Erectile dysfunction associated with type 2 diabetes mellitus ([place]) 05/28/2015  . Hypogonadism male 06/14/2012  . Obesity (BMI 30-39.9) 09/27/2011  . History of smoking 09/16/2010  . Family history of MI (myocardial infarction) 09/16/2010  . HIP REPLACEMENT, RIGHT, HX OF  02/05/2010  . GOUT 06/13/2008  . ALLERGIC RHINITIS 06/13/2008  . GERD 06/13/2008  . OSTEOARTHRITIS 06/13/2008    Past Medical History:  Diagnosis Date  . Allergic rhinitis due to pollen   . Chronic kidney disease, stage III (moderate) 12/19/2012  . Depression   . Diabetes mellitus ([place])   . Diverticulosis   . Erectile dysfunction associated with type 2 diabetes mellitus ([place]) 05/28/2015  . Former very heavy cigarette smoker (more than 40 per day) 10/07/2014  . GERD (gastroesophageal reflux disease)   . Gout   . Headache   . Hyperlipidemia   . Hypertension   . Hypogonadism male 06/14/2012  . Iron deficiency   . Low testosterone   . Memory loss   . Osteoarthritis   . Personal history of colonic polyps    1996    Past Surgical History:  Procedure Laterality Date  . CARDIAC CATHETERIZATION  11/2011   [facility]  . CHOLECYSTECTOMY  1980  . EYE SURGERY Right 07/29/2016   cataract - Dr. Manuella Ghazi  . EYE SURGERY Left 08/23/2106   cataract - Dr. Manuella Ghazi  . TOTAL HIP ARTHROPLASTY  2009   Dr. Gladstone Lighter  . TOTAL HIP ARTHROPLASTY      Social History   Social History  . Marital status: Married    Spouse name: N/A  . Number of children: 4  . Years of education: 12th   Occupational History  . Dispatcher   . retired truck driver [place]  . Smoking status: Former Smoker    Packs/day: 3.00    Years: 35.00    Types: Cigarettes    Quit date: 04/12/2000  . Smokeless tobacco: Never Used  . Alcohol use No  . Drug use: No  . Sexual activity: No   Other Topics Concern  . Not on file   Social History Narrative   No regular exercise   Caffeine use: yes, dt coke   Lives at home with his wife and his mother lives with him.   Right-handed.       Family History  Problem Relation Age of Onset  . Diabetes Mother   . Alzheimer's disease Mother   . Diabetes Father   . Lung cancer Father        Age 48  . Stroke Father   . Heart attack Father   . Lung  cancer Sister        lung  . Heart disease Maternal Grandfather   . COPD Sister   . Heart attack Sister   . Heart disease Sister     Allergies  Allergen Reactions  . Tetracycline Itching and Rash    Medication list reviewed and updated in full in [place].   GEN: No acute illnesses, no fevers, chills. GI: No n/v/d, eating normally Pulm: No SOB Interactive  and getting along well at home.  Otherwise, ROS is as per the HPI.  Objective:   BP 100/60   Pulse (!) 124   Temp 97.6 F (36.4 C) (Oral)   Ht 5' 11.25" (1.81 m)   Wt 242 lb (109.8 kg)   BMI 33.52 kg/m   GEN: WDWN, NAD, Non-toxic, A & O x 3 HEENT: Atraumatic, Normocephalic. Neck supple. No masses, No LAD. Ears and Nose: No external deformity. CV: RRR, No M/G/R. No JVD. No thrill. No extra heart sounds. PULM: CTA B, no wheezes, crackles, rhonchi. No retractions. No resp. distress. No accessory muscle use. EXTR: No c/c/e NEURO Normal gait.  PSYCH: Normally interactive. Conversant. Not depressed or anxious appearing.  Calm demeanor.   Laboratory and Imaging Data: Results for orders placed or performed in visit on 12/09/16  Testos,Total,Free and SHBG (Male)  Result Value Ref Range   Testosterone,Total,LC/MS/MS 233 (L) 250 - 1,100 ng/dL   Testosterone, Free 72.0 30.0 - 135.0 pg/mL   Sex Hormone Binding Glob. 13 (L) 22 - 77 nmol/L     Assessment and Plan:   Essential hypertension  Former very heavy cigarette smoker (more than 40 per day)  Uncontrolled type 2 diabetes mellitus with peripheral neuropathy ([place])  Major depressive disorder, recurrent episode, in partial remission ([place])  Hypertriglyceridemia  Overall, he is doing well.  Labs stable except DM - improving Dep is doing better, not perfect  Follow-up: Return in about 6 months (around 06/21/2017).  Future Appointments Date Time Provider [place]  01/06/2017 9:00 AM [facility] NURSE [facility]  01/21/2017 3:00 PM  Philemon Kingdom, MD [facility] None  05/02/2017 1:00 PM Marcial Pacas, MD [facility] None  06/22/2017 11:00 AM [name], MD [facility]  12/26/2017 10:30 AM Eustace Pen, LPN [facility]  01/02/2018 10:30 AM Olimpia Tinch, Frederico Hamman, MD [facility]    Signed,  Maud Deed. Lariyah Shetterly, MD   Patient's Medications  New Prescriptions   No medications on file  Previous Medications   AMOXICILLIN (AMOXIL) 500 MG CAPSULE    Take 500 mg by mouth daily. #30 filled 08/26/16   ASPIRIN EC 81 MG TABLET    Take 81 mg by mouth at bedtime.   ATENOLOL (TENORMIN) 100 MG TABLET    TAKE ONE TABLET BY MOUTH ONCE DAILY   BUPROPION (WELLBUTRIN SR) 150 MG 12 HR TABLET    Take 1 tablet (150 mg total) by mouth 2 (two) times daily.   CHOLECALCIFEROL (VITAMIN D) 1000 UNITS TABLET    Take 1,000 Units by mouth daily.     DONEPEZIL (ARICEPT) 10 MG TABLET    Take 1 tablet (10 mg total) by mouth at bedtime.   DOXAZOSIN (CARDURA) 8 MG TABLET    TAKE ONE TABLET BY MOUTH AT BEDTIME   FLUTICASONE (FLONASE) 50 MCG/ACT NASAL SPRAY    INHALE 2 SPRAYS INTO EACH NOSTRIL EVERY DAY AS NEEDED   GABAPENTIN (NEURONTIN) 300 MG CAPSULE    Take 1 capsule (300 mg total) by mouth 2 (two) times daily.   IBUPROFEN (ADVIL,MOTRIN) 200 MG TABLET    Take 200-400 mg by mouth every 6 (six) hours as needed for headache (pain).   INSULIN NPH HUMAN (HUMULIN N,NOVOLIN N) 100 UNIT/ML INJECTION    Inject 35 Units into the skin 2 (two) times daily before a meal.   INSULIN REGULAR (NOVOLIN R,HUMULIN R) 100 UNITS/ML INJECTION    Inject 35 Units into the skin 2 (two) times daily before a meal.   LISINOPRIL-HYDROCHLOROTHIAZIDE (PRINZIDE,ZESTORETIC) 20-12.5  MG TABLET    Take 1 tablet by mouth daily.   MEMANTINE (NAMENDA) 10 MG TABLET    Take 1 tablet (10 mg total) by mouth 2 (two) times daily.   RANITIDINE (ZANTAC 75) 75 MG TABLET    Take 225 mg by mouth at bedtime.    SILDENAFIL (REVATIO) 20 MG TABLET    Generic Revatio / Sildanefil  20 mg. 2 - 5 tabs 30 mins prior to intercourse.   SIMVASTATIN (ZOCOR) 40 MG TABLET    TAKE ONE TABLET BY MOUTH AT BEDTIME   TESTOSTERONE CYPIONATE (DEPOTESTOSTERONE CYPIONATE) 200 MG/ML INJECTION    INJECT 0.75 MLS (150 MG) INTO THE MUSCLEAS DIRECTED   VENLAFAXINE XR (EFFEXOR-XR) 75 MG 24 HR CAPSULE    TAKE THREE (3) CAPSULES BY MOUTH DAILY  Modified Medications   No medications on file  Discontinued Medications   No medications on file

## 2016-12-23 ENCOUNTER — Ambulatory Visit: Payer: Medicare Other

## 2017-01-06 ENCOUNTER — Ambulatory Visit (INDEPENDENT_AMBULATORY_CARE_PROVIDER_SITE_OTHER): Payer: Medicare Other

## 2017-01-06 DIAGNOSIS — E349 Endocrine disorder, unspecified: Secondary | ICD-10-CM

## 2017-01-06 MED ORDER — TESTOSTERONE CYPIONATE 200 MG/ML IM SOLN
150.0000 mg | Freq: Once | INTRAMUSCULAR | Status: AC
  Administered 2017-01-06: 150 mg via INTRAMUSCULAR

## 2017-01-10 DIAGNOSIS — H40003 Preglaucoma, unspecified, bilateral: Secondary | ICD-10-CM | POA: Diagnosis not present

## 2017-01-20 ENCOUNTER — Ambulatory Visit (INDEPENDENT_AMBULATORY_CARE_PROVIDER_SITE_OTHER): Payer: Medicare Other | Admitting: *Deleted

## 2017-01-20 DIAGNOSIS — E291 Testicular hypofunction: Secondary | ICD-10-CM

## 2017-01-20 MED ORDER — TESTOSTERONE CYPIONATE 100 MG/ML IM SOLN
75.0000 mg | Freq: Once | INTRAMUSCULAR | Status: AC
  Administered 2017-01-20: 75 mg via INTRAMUSCULAR

## 2017-01-21 ENCOUNTER — Ambulatory Visit: Payer: Medicare Other | Admitting: Internal Medicine

## 2017-02-03 ENCOUNTER — Ambulatory Visit (INDEPENDENT_AMBULATORY_CARE_PROVIDER_SITE_OTHER): Payer: Medicare Other

## 2017-02-03 ENCOUNTER — Ambulatory Visit: Payer: Medicare Other

## 2017-02-03 DIAGNOSIS — Z23 Encounter for immunization: Secondary | ICD-10-CM | POA: Diagnosis not present

## 2017-02-03 DIAGNOSIS — E349 Endocrine disorder, unspecified: Secondary | ICD-10-CM

## 2017-02-03 NOTE — Progress Notes (Signed)
Patient here today for monthly testosterone injection. Patient also requested a flu vaccine. Patient tolerated both injection and vaccine well.

## 2017-02-17 ENCOUNTER — Ambulatory Visit (INDEPENDENT_AMBULATORY_CARE_PROVIDER_SITE_OTHER): Payer: Medicare Other

## 2017-02-17 DIAGNOSIS — E291 Testicular hypofunction: Secondary | ICD-10-CM

## 2017-02-17 MED ORDER — TESTOSTERONE CYPIONATE 200 MG/ML IM SOLN
150.0000 mg | Freq: Once | INTRAMUSCULAR | Status: AC
  Administered 2017-02-17: 150 mg via INTRAMUSCULAR

## 2017-02-17 MED ORDER — TESTOSTERONE CYPIONATE 200 MG/ML IM SOLN
75.0000 mg | Freq: Once | INTRAMUSCULAR | Status: AC
  Administered 2017-02-17: 76 mg via INTRAMUSCULAR

## 2017-02-23 NOTE — Progress Notes (Signed)
I was available for consultation, and agree with documentation and plan.  Signed,  Maud Deed. Ivey Cina, MD

## 2017-03-02 ENCOUNTER — Ambulatory Visit (INDEPENDENT_AMBULATORY_CARE_PROVIDER_SITE_OTHER): Payer: Medicare Other

## 2017-03-02 ENCOUNTER — Ambulatory Visit (INDEPENDENT_AMBULATORY_CARE_PROVIDER_SITE_OTHER): Payer: Medicare Other | Admitting: Family Medicine

## 2017-03-02 ENCOUNTER — Encounter: Payer: Self-pay | Admitting: Family Medicine

## 2017-03-02 VITALS — Temp 97.8°F | Wt 238.0 lb

## 2017-03-02 DIAGNOSIS — E1165 Type 2 diabetes mellitus with hyperglycemia: Secondary | ICD-10-CM | POA: Diagnosis not present

## 2017-03-02 DIAGNOSIS — N183 Chronic kidney disease, stage 3 unspecified: Secondary | ICD-10-CM

## 2017-03-02 DIAGNOSIS — E349 Endocrine disorder, unspecified: Secondary | ICD-10-CM | POA: Diagnosis not present

## 2017-03-02 DIAGNOSIS — W19XXXA Unspecified fall, initial encounter: Secondary | ICD-10-CM

## 2017-03-02 DIAGNOSIS — I10 Essential (primary) hypertension: Secondary | ICD-10-CM

## 2017-03-02 DIAGNOSIS — E0841 Diabetes mellitus due to underlying condition with diabetic mononeuropathy: Secondary | ICD-10-CM

## 2017-03-02 DIAGNOSIS — R55 Syncope and collapse: Secondary | ICD-10-CM

## 2017-03-02 DIAGNOSIS — Y92009 Unspecified place in unspecified non-institutional (private) residence as the place of occurrence of the external cause: Secondary | ICD-10-CM | POA: Diagnosis not present

## 2017-03-02 DIAGNOSIS — R2681 Unsteadiness on feet: Secondary | ICD-10-CM | POA: Diagnosis not present

## 2017-03-02 DIAGNOSIS — E1142 Type 2 diabetes mellitus with diabetic polyneuropathy: Secondary | ICD-10-CM | POA: Diagnosis not present

## 2017-03-02 DIAGNOSIS — IMO0002 Reserved for concepts with insufficient information to code with codable children: Secondary | ICD-10-CM

## 2017-03-02 MED ORDER — TESTOSTERONE CYPIONATE 200 MG/ML IM SOLN: 150.0000 mg | Freq: Once | INTRAMUSCULAR | Status: DC

## 2017-03-02 MED ORDER — TESTOSTERONE CYPIONATE 200 MG/ML IM SOLN
150.0000 mg | Freq: Once | INTRAMUSCULAR | Status: AC
  Administered 2017-03-02: 150 mg via INTRAMUSCULAR

## 2017-03-02 NOTE — Progress Notes (Signed)
Temp 97.8 F (36.6 C) (Oral)   Wt 238 lb (108 kg)   SpO2 95%   BMI 32.96 kg/m   See flowchart for vital signs today.   CC: fall at home Subjective:    Patient ID: [name], male    DOB: 07-Jul-1945, 71 y.o.   [name]: [id number]  HPI: [name] is a 71 y.o. male presenting on 03/02/2017 for Loss of Consciousness (Happened 02/28/17. Pt went to bathroom and fell hitting back)   Here as walk in patient, was here for testosterone injection and mentioned that he had fall at home bathroom on Monday 6am when he awoke to use the restroom. Before void felt weak "all strength went out of me like a wet noodle". Was able to hold on to walker as he dropped to floor. Hit back on sink but no significant injury - small bruise. No head injury. Grandson helped him get up. cbg at that time 180. Did not check blood pressure at that time.   Denies LOC or syncope, lightheadedness or presyncope. No stroke related symptoms. No chest pain, dyspnea, palpitations, headache. Has had prior episodes like this in the past - resolved with less concentrated insulin use.  Noticing increasing unsteadiness on feet last few weeks.   H/o uncontrolled DM - followed by endo Dr Cruzita Lederer Lab Results  Component Value Date   HGBA1C 9.5 11/30/2016     Relevant past medical, surgical, family and social history reviewed and updated as indicated. Interim medical history since our last visit reviewed. Allergies and medications reviewed and updated. Outpatient Medications Prior to Visit  Medication Sig Dispense Refill  . amoxicillin (AMOXIL) 500 MG capsule Take 500 mg by mouth daily. #30 filled 08/26/16    . aspirin EC 81 MG tablet Take 81 mg by mouth at bedtime.    [name] Kitchen atenolol (TENORMIN) 100 MG tablet TAKE ONE TABLET BY MOUTH ONCE DAILY 90 tablet 1  . buPROPion (WELLBUTRIN SR) 150 MG 12 hr tablet Take 1 tablet (150 mg total) by mouth 2 (two) times daily. 60 tablet 5  . cholecalciferol (VITAMIN D) 1000 UNITS tablet Take  1,000 Units by mouth daily.      [name] Kitchen donepezil (ARICEPT) 10 MG tablet Take 1 tablet (10 mg total) by mouth at bedtime. 30 tablet 11  . doxazosin (CARDURA) 8 MG tablet Take 0.5 tablets (4 mg total) by mouth at bedtime.    . fluticasone (FLONASE) 50 MCG/ACT nasal spray INHALE 2 SPRAYS INTO EACH NOSTRIL EVERY DAY AS NEEDED (Patient taking differently: INHALE 2 SPRAYS INTO EACH NOSTRIL EVERY DAY AS NEEDED FOR CONGESTION) 16 g 11  . gabapentin (NEURONTIN) 300 MG capsule Take 1 capsule (300 mg total) by mouth 2 (two) times daily. 90 capsule 3  . ibuprofen (ADVIL,MOTRIN) 200 MG tablet Take 200-400 mg by mouth every 6 (six) hours as needed for headache (pain).    . insulin NPH Human (HUMULIN N,NOVOLIN N) 100 UNIT/ML injection Inject 35 Units into the skin 2 (two) times daily before a meal.    . insulin regular (NOVOLIN R,HUMULIN R) 100 units/mL injection Inject 35 Units into the skin 2 (two) times daily before a meal.    . lisinopril-hydrochlorothiazide (PRINZIDE,ZESTORETIC) 20-12.5 MG tablet Take 1 tablet by mouth daily. 90 tablet 1  . memantine (NAMENDA) 10 MG tablet Take 1 tablet (10 mg total) by mouth 2 (two) times daily. 60 tablet 11  . ranitidine (ZANTAC 75) 75 MG tablet Take 225 mg by mouth at bedtime.     [name] Kitchen  sildenafil (REVATIO) 20 MG tablet Generic Revatio / Sildanefil 20 mg. 2 - 5 tabs 30 mins prior to intercourse. (Patient taking differently: Take 80-100 mg by mouth daily as needed (erectile dysfunction - 30 minutes prior to intercourse). ) 10 tablet prn  . simvastatin (ZOCOR) 40 MG tablet TAKE ONE TABLET BY MOUTH AT BEDTIME 30 tablet 11  . testosterone cypionate (DEPOTESTOSTERONE CYPIONATE) 200 MG/ML injection INJECT 0.75 MLS (150 MG) INTO THE MUSCLEAS DIRECTED 10 mL 0  . venlafaxine XR (EFFEXOR-XR) 75 MG 24 hr capsule TAKE THREE (3) CAPSULES BY MOUTH DAILY (Patient taking differently: TAKE ONE CAPSULE (75 MG) BY MOUTH EVERY MORNING AND TWO CAPSULES (150 MG) AT NIGHT) 270 capsule 1  . doxazosin  (CARDURA) 8 MG tablet TAKE ONE TABLET BY MOUTH AT BEDTIME 90 tablet 3   Facility-Administered Medications Prior to Visit  Medication Dose Route Frequency Provider Last Rate Last Dose  . testosterone cypionate (DEPOTESTOSTERONE CYPIONATE) injection 150 mg  150 mg Intramuscular Q14 Days Copland, Spencer, MD   150 mg at 02/03/17 1010  . testosterone cypionate (DEPOTESTOSTERONE CYPIONATE) injection 150 mg  150 mg Intramuscular Q14 Days Copland, Spencer, MD   150 mg at 12/22/16 1226     Per HPI unless specifically indicated in ROS section below Review of Systems     Objective:    Temp 97.8 F (36.6 C) (Oral)   Wt 238 lb (108 kg)   SpO2 95%   BMI 32.96 kg/m   Wt Readings from Last 3 Encounters:  03/02/17 238 lb (108 kg)  12/22/16 242 lb (109.8 kg)  12/09/16 242 lb 4 oz (109.9 kg)    Physical Exam  Constitutional: He appears well-developed and well-nourished. No distress.  HENT:  Mouth/Throat: Oropharynx is clear and moist. No oropharyngeal exudate.  Cardiovascular: Normal rate, regular rhythm, normal heart sounds and intact distal pulses.  No murmur heard. Pulmonary/Chest: Effort normal and breath sounds normal. No respiratory distress. He has no wheezes. He has no rales.  Musculoskeletal: He exhibits no edema.  Skin: Skin is warm and dry. No rash noted.  Psychiatric: He has a normal mood and affect.  Nursing note and vitals reviewed.  Results for orders placed or performed in visit on 12/09/16  Testos,Total,Free and SHBG (Male)  Result Value Ref Range   Testosterone,Total,LC/MS/MS 233 (L) 250 - 1,100 ng/dL   Testosterone, Free 72.0 30.0 - 135.0 pg/mL   Sex Hormone Binding Glob. 13 (L) 22 - 77 nmol/L      Assessment & Plan:   Problem List Items Addressed This Visit    Chronic kidney disease, stage III (moderate) (HCC) (Chronic)   Diabetic neuropathy (HCC) (Chronic)   Essential hypertension (Chronic)   Relevant Medications   doxazosin (CARDURA) 8 MG tablet   Fall at  home, initial encounter - Primary    Recent fall at home Monday morning when he first got up, prior to voiding. Denies syncope or significant trauma. cbg at that time normal. I wonder if this was hypotension related upon first awakening. I suggested he try lower doxazosin 4mg  at night time and monitor symptoms and BP and update [place] with effect. Pt agrees with plan. Not consistent with stroke or pre-syncope.       Uncontrolled type 2 diabetes mellitus with peripheral neuropathy (HCC) (Chronic)   Unsteady gait       Follow up plan: Return if symptoms worsen or fail to improve.  Ria Bush, MD

## 2017-03-02 NOTE — Patient Instructions (Addendum)
I wonder if episode earlier this week was from low blood pressures. I recommend we decrease doxazosin to 4mg  nightly (1/2 tablet).  Buy BP cuff and monitor blood pressures at home - and may return to 8mg  dose if they start trending too high.  Let us know how you're doing.

## 2017-03-02 NOTE — Assessment & Plan Note (Addendum)
Recent fall at home Monday morning when he first got up, prior to voiding. Denies syncope or significant trauma. cbg at that time normal. I wonder if this was hypotension related upon first awakening. I suggested he try lower doxazosin 4mg  at night time and monitor symptoms and BP and update [place] with effect. Pt agrees with plan. Not consistent with stroke or pre-syncope.

## 2017-03-16 ENCOUNTER — Ambulatory Visit (INDEPENDENT_AMBULATORY_CARE_PROVIDER_SITE_OTHER): Payer: Medicare Other

## 2017-03-16 DIAGNOSIS — E291 Testicular hypofunction: Secondary | ICD-10-CM

## 2017-03-16 MED ORDER — TESTOSTERONE CYPIONATE 200 MG/ML IM SOLN
150.0000 mg | INTRAMUSCULAR | Status: DC
  Administered 2017-03-16: 150 mg via INTRAMUSCULAR

## 2017-03-17 ENCOUNTER — Ambulatory Visit: Payer: Medicare Other

## 2017-03-30 ENCOUNTER — Ambulatory Visit: Payer: Medicare Other

## 2017-03-31 ENCOUNTER — Ambulatory Visit (INDEPENDENT_AMBULATORY_CARE_PROVIDER_SITE_OTHER): Payer: Medicare Other

## 2017-03-31 DIAGNOSIS — E291 Testicular hypofunction: Secondary | ICD-10-CM

## 2017-03-31 MED ORDER — TESTOSTERONE CYPIONATE 200 MG/ML IM SOLN
150.0000 mg | INTRAMUSCULAR | Status: DC
  Administered 2017-03-31: 150 mg via INTRAMUSCULAR

## 2017-04-07 ENCOUNTER — Ambulatory Visit (INDEPENDENT_AMBULATORY_CARE_PROVIDER_SITE_OTHER): Payer: Medicare Other | Admitting: Family Medicine

## 2017-04-07 ENCOUNTER — Encounter: Payer: Self-pay | Admitting: Family Medicine

## 2017-04-07 ENCOUNTER — Other Ambulatory Visit: Payer: Self-pay

## 2017-04-07 VITALS — BP 158/60 | HR 108 | Temp 97.3°F | Ht 71.25 in | Wt 240.8 lb

## 2017-04-07 DIAGNOSIS — E291 Testicular hypofunction: Secondary | ICD-10-CM

## 2017-04-07 MED ORDER — TESTOSTERONE 20.25 MG/ACT (1.62%) TD GEL: TRANSDERMAL | 5 refills | Status: DC

## 2017-04-07 NOTE — Addendum Note (Signed)
Addended by: Modena Nunnery on: 04/07/2017 12:52 PM   Modules accepted: Orders

## 2017-04-07 NOTE — Progress Notes (Signed)
Dr. Frederico Hamman T. Lyonel Morejon, MD, [facility] [place], [address] Phone: [phone] Fax: [phone]  04/07/2017  Patient: [name], [name]: [id number], DOB: 01/28/1946, 71 y.o.  Primary Physician:  [name], MD   Chief Complaint  Patient presents with  . Medication Management    Wants to change from injections to Androgel   Subjective:   [name] is a 71 y.o. very pleasant male patient who presents with the following:  Did dropped his neurontin.   Depotest 150 mg every 2 weeks -he is here, he is actually feeling reasonably good.  He wanted to stop and change from his Depo testosterone injections that he has been getting every 2 weeks to convert to topical AndroGel.  He has been this before previously, but he changed secondary to cost.  Prev on Androgel 1% - 6 squirts a day  Past Medical History, Surgical History, Social History, Family History, Problem List, Medications, and Allergies have been reviewed and updated if relevant.  Patient Active Problem List   Diagnosis Date Noted  . Uncontrolled type 2 diabetes mellitus with peripheral neuropathy ([place]) 05/11/2016    Priority: Medium  . Former very heavy cigarette smoker (more than 40 per day) 10/07/2014    Priority: Medium  . Chronic kidney disease, stage III (moderate) ([place]) 12/19/2012    Priority: Medium  . OSA (obstructive sleep apnea) 03/09/2011    Priority: Medium  . Diabetic neuropathy ([place]) 12/29/2010    Priority: Medium  . Hypertriglyceridemia 06/13/2008    Priority: Medium  . Major depressive disorder, recurrent episode, in partial remission ([place]) 06/13/2008    Priority: Medium  . Essential hypertension 06/13/2008    Priority: Medium  . Fall at home, initial encounter 03/02/2017  . Muscle spasm 11/05/2016  . Unsteady gait 10/10/2016  . Mild cognitive disorder 06/29/2016  . Erectile dysfunction associated with type 2 diabetes  mellitus ([place]) 05/28/2015  . Hypogonadism male 06/14/2012  . Obesity (BMI 30-39.9) 09/27/2011  . History of smoking 09/16/2010  . Family history of MI (myocardial infarction) 09/16/2010  . HIP REPLACEMENT, RIGHT, HX OF 02/05/2010  . GOUT 06/13/2008  . ALLERGIC RHINITIS 06/13/2008  . GERD 06/13/2008  . OSTEOARTHRITIS 06/13/2008    Past Medical History:  Diagnosis Date  . Allergic rhinitis due to pollen   . Chronic kidney disease, stage III (moderate) ([place]) 12/19/2012  . Depression   . Diabetes mellitus ([place])   . Diverticulosis   . Erectile dysfunction associated with type 2 diabetes mellitus ([place]) 05/28/2015  . Former very heavy cigarette smoker (more than 40 per day) 10/07/2014  . GERD (gastroesophageal reflux disease)   . Gout   . Headache   . Hyperlipidemia   . Hypertension   . Hypogonadism male 06/14/2012  . Iron deficiency   . Low testosterone   . Memory loss   . Osteoarthritis   . Personal history of colonic polyps    1996    Past Surgical History:  Procedure Laterality Date  . CARDIAC CATHETERIZATION  11/2011   [facility]  . CHOLECYSTECTOMY  1980  . EYE SURGERY Right 07/29/2016   cataract - Dr. Manuella Ghazi  . EYE SURGERY Left 08/23/2106   cataract - Dr. Manuella Ghazi  . TOTAL HIP ARTHROPLASTY  2009   Dr. Gladstone Lighter  . TOTAL HIP ARTHROPLASTY      Social History   Socioeconomic History  . Marital status: Married    Spouse name: Not on file  .  Number of children: 4  . Years of education: 12th  . Highest education level: Not on file  Social Needs  . Financial resource strain: Not on file  . Food insecurity - worry: Not on file  . Food insecurity - inability: Not on file  . Transportation needs - medical: Not on file  . Transportation needs - non-medical: Not on file  Occupational History  . Occupation: Counsellor  . Occupation: retired Magazine features editor: [employer]  Tobacco Use  . Smoking status: Former Smoker    Packs/day: 3.00    Years: 35.00    Pack years:  105.00    Types: Cigarettes    Last attempt to quit: 04/12/2000    Years since quitting: 16.9  . Smokeless tobacco: Never Used  Substance and Sexual Activity  . Alcohol use: No  . Drug use: No  . Sexual activity: No  Other Topics Concern  . Not on file  Social History Narrative   No regular exercise   Caffeine use: yes, dt coke   Lives at home with his wife and his mother lives with him.   Right-handed.    Family History  Problem Relation Age of Onset  . Diabetes Mother   . Alzheimer's disease Mother   . Diabetes Father   . Lung cancer Father        Age 10  . Stroke Father   . Heart attack Father   . Lung cancer Sister        lung  . Heart disease Maternal Grandfather   . COPD Sister   . Heart attack Sister   . Heart disease Sister     Allergies  Allergen Reactions  . Tetracycline Itching and Rash    Medication list reviewed and updated in full in [place].   GEN: No acute illnesses, no fevers, chills. GI: No n/v/d, eating normally Pulm: No SOB Interactive and getting along well at home.  Otherwise, ROS is as per the HPI.  Objective:   BP (!) 158/60   Pulse (!) 108   Temp (!) 97.3 F (36.3 C) (Oral)   Ht 5' 11.25" (1.81 m)   Wt 240 lb 12 oz (109.2 kg)   BMI 33.34 kg/m   GEN: WDWN, NAD, Non-toxic, A & O x 3 HEENT: Atraumatic, Normocephalic. Neck supple. No masses, No LAD. Ears and Nose: No external deformity. CV: RRR, No M/G/R. No JVD. No thrill. No extra heart sounds. PULM: CTA B, no wheezes, crackles, rhonchi. No retractions. No resp. distress. No accessory muscle use. EXTR: No c/c/e NEURO Normal gait.  PSYCH: Normally interactive. Conversant. Not depressed or anxious appearing.  Calm demeanor.   Laboratory and Imaging Data:  Assessment and Plan:   Hypogonadism in male - Plan: Testos,Total,Free and SHBG (Male)  Check levels in 6 weeks after change.   Follow-up: Return in about 6 weeks (around 05/19/2017) for f/u 8-10 AM fasting lab  draw.   Meds ordered this encounter  Medications  . Testosterone 20.25 MG/ACT (1.62%) GEL    Sig: 1 pump daily to clean, dry shoulder    Dispense:  75 g    Refill:  5   Orders Placed This Encounter  Procedures  . Testos,Total,Free and SHBG (Male)    Signed,  Frederico Hamman T. Lonnell Chaput, MD   Allergies as of 04/07/2017      Reactions   Tetracycline Itching, Rash      Medication List  Accurate as of 04/07/17 11:26 AM. Always use your most recent med list.          amoxicillin 500 MG capsule Commonly known as:  AMOXIL Take 500 mg by mouth daily. #30 filled 08/26/16   aspirin EC 81 MG tablet Take 81 mg by mouth at bedtime.   atenolol 100 MG tablet Commonly known as:  TENORMIN TAKE ONE TABLET BY MOUTH ONCE DAILY   buPROPion 150 MG 12 hr tablet Commonly known as:  WELLBUTRIN SR Take 1 tablet (150 mg total) by mouth 2 (two) times daily.   cholecalciferol 1000 units tablet Commonly known as:  VITAMIN D Take 1,000 Units by mouth daily.   donepezil 10 MG tablet Commonly known as:  ARICEPT Take 1 tablet (10 mg total) by mouth at bedtime.   doxazosin 8 MG tablet Commonly known as:  CARDURA Take 0.5 tablets (4 mg total) by mouth at bedtime.   fluticasone 50 MCG/ACT nasal spray Commonly known as:  FLONASE INHALE 2 SPRAYS INTO EACH NOSTRIL EVERY DAY AS NEEDED   gabapentin 300 MG capsule Commonly known as:  NEURONTIN Take 1 capsule (300 mg total) by mouth 2 (two) times daily.   ibuprofen 200 MG tablet Commonly known as:  ADVIL,MOTRIN Take 200-400 mg by mouth every 6 (six) hours as needed for headache (pain).   insulin NPH Human 100 UNIT/ML injection Commonly known as:  HUMULIN N,NOVOLIN N Inject 35 Units into the skin 2 (two) times daily before a meal.   insulin regular 100 units/mL injection Commonly known as:  NOVOLIN R,HUMULIN R Inject 35 Units into the skin 2 (two) times daily before a meal.   lisinopril-hydrochlorothiazide 20-12.5 MG tablet Commonly  known as:  PRINZIDE,ZESTORETIC Take 1 tablet by mouth daily.   memantine 10 MG tablet Commonly known as:  NAMENDA Take 1 tablet (10 mg total) by mouth 2 (two) times daily.   sildenafil 20 MG tablet Commonly known as:  REVATIO Generic Revatio / Sildanefil 20 mg. 2 - 5 tabs 30 mins prior to intercourse.   simvastatin 40 MG tablet Commonly known as:  ZOCOR TAKE ONE TABLET BY MOUTH AT BEDTIME   Testosterone 20.25 MG/ACT (1.62%) Gel 1 pump daily to clean, dry shoulder   testosterone cypionate 200 MG/ML injection Commonly known as:  DEPOTESTOSTERONE CYPIONATE INJECT 0.75 MLS (150 MG) INTO THE MUSCLEAS DIRECTED   venlafaxine XR 75 MG 24 hr capsule Commonly known as:  EFFEXOR-XR TAKE THREE (3) CAPSULES BY MOUTH DAILY   ZANTAC 75 75 MG tablet Generic drug:  ranitidine Take 225 mg by mouth at bedtime.

## 2017-04-13 ENCOUNTER — Ambulatory Visit: Payer: Medicare Other

## 2017-04-21 ENCOUNTER — Ambulatory Visit (INDEPENDENT_AMBULATORY_CARE_PROVIDER_SITE_OTHER): Payer: Medicare Other

## 2017-04-21 DIAGNOSIS — E349 Endocrine disorder, unspecified: Secondary | ICD-10-CM | POA: Diagnosis not present

## 2017-04-21 MED ORDER — TESTOSTERONE CYPIONATE 200 MG/ML IM SOLN
150.0000 mg | Freq: Once | INTRAMUSCULAR | Status: AC
  Administered 2017-04-21: 150 mg via INTRAMUSCULAR

## 2017-04-23 DIAGNOSIS — R194 Change in bowel habit: Secondary | ICD-10-CM | POA: Diagnosis not present

## 2017-04-23 DIAGNOSIS — Z800 Family history of malignant neoplasm of digestive organs: Secondary | ICD-10-CM | POA: Diagnosis not present

## 2017-04-23 DIAGNOSIS — R112 Nausea with vomiting, unspecified: Secondary | ICD-10-CM | POA: Diagnosis not present

## 2017-04-23 DIAGNOSIS — R5383 Other fatigue: Secondary | ICD-10-CM | POA: Diagnosis not present

## 2017-04-23 DIAGNOSIS — L853 Xerosis cutis: Secondary | ICD-10-CM | POA: Diagnosis not present

## 2017-05-02 ENCOUNTER — Telehealth: Payer: Self-pay | Admitting: *Deleted

## 2017-05-02 ENCOUNTER — Ambulatory Visit: Payer: Medicare Other | Admitting: Neurology

## 2017-05-02 NOTE — Telephone Encounter (Signed)
Canceled 1pm appt at noon - sick.

## 2017-05-03 ENCOUNTER — Other Ambulatory Visit: Payer: Self-pay | Admitting: Internal Medicine

## 2017-05-03 ENCOUNTER — Encounter: Payer: Self-pay | Admitting: Internal Medicine

## 2017-05-03 ENCOUNTER — Encounter: Payer: Self-pay | Admitting: Neurology

## 2017-05-03 ENCOUNTER — Ambulatory Visit (INDEPENDENT_AMBULATORY_CARE_PROVIDER_SITE_OTHER): Payer: Medicare Other | Admitting: Internal Medicine

## 2017-05-03 ENCOUNTER — Ambulatory Visit (INDEPENDENT_AMBULATORY_CARE_PROVIDER_SITE_OTHER)
Admission: RE | Admit: 2017-05-03 | Discharge: 2017-05-03 | Disposition: A | Discharge status: Home / Self Care | Payer: Medicare Other | Source: Ambulatory Visit | Attending: Internal Medicine | Admitting: Internal Medicine

## 2017-05-03 VITALS — BP 144/76 | HR 78 | Temp 97.8°F | Wt 235.0 lb

## 2017-05-03 DIAGNOSIS — K56699 Other intestinal obstruction unspecified as to partial versus complete obstruction: Secondary | ICD-10-CM | POA: Diagnosis not present

## 2017-05-03 DIAGNOSIS — R197 Diarrhea, unspecified: Secondary | ICD-10-CM | POA: Diagnosis not present

## 2017-05-03 LAB — H. PYLORI ANTIBODY, IGG: H PYLORI IGG: NEGATIVE

## 2017-05-03 NOTE — Progress Notes (Signed)
Subjective:    Patient ID: [name], male    DOB: 1945-04-16, 72 y.o.   [name]: [id number]  HPI  Pt presents to the clinic today with c/o diarrhea. He reports this initially started 2 weeks ago. It did seem to improve but 3 days ago, flared back up again. The stool is normal in color, but very loose. He has had 9 BM's today. He denies nausea, vomiting or abdominal cramping. He has associated belching, which he reports "smells like a BM coming through my mouth". He has seen a little bit of blood on the toilet paper when he wipes. He denies rectal pain or itching. He denies recent changes in diet or medication. He has a history of GERD, for which he takes Ranitidine, but he reports this is different. He denies recent travel or antibiotic use. He has tried Imodium with minimal relief. Colonoscopy from 2011 did show diverticulosis.  Review of Systems      Past Medical History:  Diagnosis Date  . Allergic rhinitis due to pollen   . Chronic kidney disease, stage III (moderate) ([place]) 12/19/2012  . Depression   . Diabetes mellitus ([place])   . Diverticulosis   . Erectile dysfunction associated with type 2 diabetes mellitus ([place]) 05/28/2015  . Former very heavy cigarette smoker (more than 40 per day) 10/07/2014  . GERD (gastroesophageal reflux disease)   . Gout   . Headache   . Hyperlipidemia   . Hypertension   . Hypogonadism male 06/14/2012  . Iron deficiency   . Low testosterone   . Memory loss   . Osteoarthritis   . Personal history of colonic polyps    1996    Current Outpatient Medications  Medication Sig Dispense Refill  . aspirin EC 81 MG tablet Take 81 mg by mouth at bedtime.    [name] Kitchen atenolol (TENORMIN) 100 MG tablet TAKE ONE TABLET BY MOUTH ONCE DAILY 90 tablet 1  . buPROPion (WELLBUTRIN SR) 150 MG 12 hr tablet Take 1 tablet (150 mg total) by mouth 2 (two) times daily. 60 tablet 5  . cholecalciferol (VITAMIN D) 1000 UNITS tablet Take 1,000 Units by mouth daily.      [name] Kitchen donepezil  (ARICEPT) 10 MG tablet Take 1 tablet (10 mg total) by mouth at bedtime. 30 tablet 11  . doxazosin (CARDURA) 8 MG tablet Take 0.5 tablets (4 mg total) by mouth at bedtime.    . fluticasone (FLONASE) 50 MCG/ACT nasal spray INHALE 2 SPRAYS INTO EACH NOSTRIL EVERY DAY AS NEEDED (Patient taking differently: INHALE 2 SPRAYS INTO EACH NOSTRIL EVERY DAY AS NEEDED FOR CONGESTION) 16 g 11  . gabapentin (NEURONTIN) 300 MG capsule Take 1 capsule (300 mg total) by mouth 2 (two) times daily. 90 capsule 3  . ibuprofen (ADVIL,MOTRIN) 200 MG tablet Take 200-400 mg by mouth every 6 (six) hours as needed for headache (pain).    . insulin NPH Human (HUMULIN N,NOVOLIN N) 100 UNIT/ML injection Inject 35 Units into the skin 2 (two) times daily before a meal.    . insulin regular (NOVOLIN R,HUMULIN R) 100 units/mL injection Inject 35 Units into the skin 2 (two) times daily before a meal.    . lisinopril-hydrochlorothiazide (PRINZIDE,ZESTORETIC) 20-12.5 MG tablet Take 1 tablet by mouth daily. 90 tablet 1  . memantine (NAMENDA) 10 MG tablet Take 1 tablet (10 mg total) by mouth 2 (two) times daily. 60 tablet 11  . ranitidine (ZANTAC 75) 75 MG tablet Take 225 mg by mouth at bedtime.     [name] Kitchen  sildenafil (REVATIO) 20 MG tablet Generic Revatio / Sildanefil 20 mg. 2 - 5 tabs 30 mins prior to intercourse. (Patient taking differently: Take 80-100 mg by mouth daily as needed (erectile dysfunction - 30 minutes prior to intercourse). ) 10 tablet prn  . simvastatin (ZOCOR) 40 MG tablet TAKE ONE TABLET BY MOUTH AT BEDTIME 30 tablet 11  . testosterone cypionate (DEPOTESTOSTERONE CYPIONATE) 200 MG/ML injection INJECT 0.75 MLS (150 MG) INTO THE MUSCLEAS DIRECTED 10 mL 0  . venlafaxine XR (EFFEXOR-XR) 75 MG 24 hr capsule TAKE THREE (3) CAPSULES BY MOUTH DAILY (Patient taking differently: TAKE ONE CAPSULE (75 MG) BY MOUTH EVERY MORNING AND TWO CAPSULES (150 MG) AT NIGHT) 270 capsule 1   Current Facility-Administered Medications  Medication Dose  Route Frequency Provider Last Rate Last Dose  . testosterone cypionate (DEPOTESTOSTERONE CYPIONATE) injection 150 mg  150 mg Intramuscular Q14 Days Copland, Spencer, MD   150 mg at 03/16/17 1019  . testosterone cypionate (DEPOTESTOSTERONE CYPIONATE) injection 150 mg  150 mg Intramuscular Q14 Days Copland, Spencer, MD   150 mg at 03/31/17 1505    Allergies  Allergen Reactions  . Tetracycline Itching and Rash    Family History  Problem Relation Age of Onset  . Diabetes Mother   . Alzheimer's disease Mother   . Diabetes Father   . Lung cancer Father        Age 57  . Stroke Father   . Heart attack Father   . Lung cancer Sister        lung  . Heart disease Maternal Grandfather   . COPD Sister   . Heart attack Sister   . Heart disease Sister     Social History   Socioeconomic History  . Marital status: Married    Spouse name: Not on file  . Number of children: 4  . Years of education: 12th  . Highest education level: Not on file  Social Needs  . Financial resource strain: Not on file  . Food insecurity - worry: Not on file  . Food insecurity - inability: Not on file  . Transportation needs - medical: Not on file  . Transportation needs - non-medical: Not on file  Occupational History  . Occupation: Counsellor  . Occupation: retired Magazine features editor: [employer]  Tobacco Use  . Smoking status: Former Smoker    Packs/day: 3.00    Years: 35.00    Pack years: 105.00    Types: Cigarettes    Last attempt to quit: 04/12/2000    Years since quitting: 17.0  . Smokeless tobacco: Never Used  Substance and Sexual Activity  . Alcohol use: No  . Drug use: No  . Sexual activity: No  Other Topics Concern  . Not on file  Social History Narrative   No regular exercise   Caffeine use: yes, dt coke   Lives at home with his wife and his mother lives with him.   Right-handed.     Constitutional: Denies fever, malaise, fatigue, headache or abrupt weight changes.    Gastrointestinal: Pt reports belching and diarrhea. Denies abdominal pain, bloating, constipation, or blood in the stool.    No other specific complaints in a complete review of systems (except as listed in HPI above).  Objective:   Physical Exam   BP (!) 144/76   Pulse 78   Temp 97.8 F (36.6 C) (Oral)   Wt 235 lb (106.6 kg)   SpO2 98%   BMI 32.55  kg/m  Wt Readings from Last 3 Encounters:  05/03/17 235 lb (106.6 kg)  04/07/17 240 lb 12 oz (109.2 kg)  03/02/17 238 lb (108 kg)    General: Appears his stated age, in NAD. Abdomen: Soft and generally tender. Hyperactive bowel sounds. No distention or masses noted.  Rectal: Deferred.  BMET    Component Value Date/Time   NA 135 12/09/2016 0855   NA 130 (L) 11/25/2011 1119   NA 137 11/16/2011 0513   K 4.8 12/09/2016 0855   K 4.1 11/16/2011 0513   CL 96 12/09/2016 0855   CL 100 11/16/2011 0513   CO2 32 12/09/2016 0855   CO2 29 11/16/2011 0513   GLUCOSE 146 (H) 12/09/2016 0855   GLUCOSE 239 (H) 11/16/2011 0513   BUN 25 (H) 12/09/2016 0855   BUN 25 11/25/2011 1119   BUN 15 11/16/2011 0513   CREATININE 1.71 (H) 12/09/2016 0855   CREATININE 1.26 11/16/2011 0513   CALCIUM 9.4 12/09/2016 0855   CALCIUM 9.1 11/16/2011 0513   GFRNONAA 31 (L) 09/20/2016 1543   GFRNONAA 59 (L) 11/16/2011 0513   GFRAA 36 (L) 09/20/2016 1543   GFRAA >60 11/16/2011 0513    Lipid Panel     Component Value Date/Time   CHOL 188 12/09/2016 0855   TRIG (H) 12/09/2016 0855    1187.0 Triglyceride is over 400; calculations on Lipids are invalid.   HDL 30.90 (L) 12/09/2016 0855   CHOLHDL 6 12/09/2016 0855   VLDL 127.0 (H) 12/12/2013 0902   LDLCALC 43 04/24/2009    CBC    Component Value Date/Time   WBC 7.5 12/09/2016 0855   RBC 4.80 12/09/2016 0855   HGB 15.4 12/09/2016 0855   HGB 12.8 (L) 11/16/2011 0513   HCT 43.8 12/09/2016 0855   HCT 36.1 (L) 11/16/2011 0513   PLT 188.0 12/09/2016 0855   PLT 205 11/16/2011 0513   MCV 91.4  12/09/2016 0855   MCV 90 11/16/2011 0513   MCH 31.0 09/20/2016 1543   MCHC 35.0 12/09/2016 0855   RDW 13.2 12/09/2016 0855   RDW 13.1 11/16/2011 0513   LYMPHSABS 1.7 12/09/2016 0855   LYMPHSABS 1.8 11/16/2011 0513   MONOABS 0.8 12/09/2016 0855   MONOABS 0.5 11/16/2011 0513   EOSABS 0.1 12/09/2016 0855   EOSABS 0.1 11/16/2011 0513   BASOSABS 0.0 12/09/2016 0855   BASOSABS 0.0 11/16/2011 0513    Hgb A1C Lab Results  Component Value Date   HGBA1C 9.5 11/30/2016           Assessment & Plan:   Diarrhea:  KUB to r/o obstruction Will check C Diff, Stool Culture and H Pylori Hold off on Imodium until test results are back Drink plenty of water, clear liquid diet- advance as tolerated  Return precautions discussed Webb Silversmith, NP

## 2017-05-03 NOTE — Patient Instructions (Signed)
Diarrhea, Adult °Diarrhea is when you have loose and water poop (stool) often. Diarrhea can make you feel weak and cause you to get dehydrated. Dehydration can make you tired and thirsty, make you have a dry mouth, and make it so you pee (urinate) less often. Diarrhea often lasts 2-3 days. However, it can last longer if it is a sign of something more serious. It is important to treat your diarrhea as told by your doctor. °Follow these instructions at home: °Eating and drinking ° °Follow these recommendations as told by your doctor: °· Take an oral rehydration solution (ORS). This is a drink that is sold at pharmacies and stores. °· Drink clear fluids, such as: °? Water. °? Ice chips. °? Diluted fruit juice. °? Low-calorie sports drinks. °· Eat bland, easy-to-digest foods in small amounts as you are able. These foods include: °? Bananas. °? Applesauce. °? Rice. °? Low-fat (lean) meats. °? Toast. °? Crackers. °· Avoid drinking fluids that have a lot of sugar or caffeine in them. °· Avoid alcohol. °· Avoid spicy or fatty foods. ° °General instructions ° °· Drink enough fluid to keep your pee (urine) clear or pale yellow. °· Wash your hands often. If you cannot use soap and water, use hand sanitizer. °· Make sure that all people in your home wash their hands well and often. °· Take over-the-counter and prescription medicines only as told by your doctor. °· Rest at home while you get better. °· Watch your condition for any changes. °· Take a warm bath to help with any burning or pain from having diarrhea. °· Keep all follow-up visits as told by your doctor. This is important. °Contact a doctor if: °· You have a fever. °· Your diarrhea gets worse. °· You have new symptoms. °· You cannot keep fluids down. °· You feel light-headed or dizzy. °· You have a headache. °· You have muscle cramps. °Get help right away if: °· You have chest pain. °· You feel very weak or you pass out (faint). °· You have bloody or black poop or  poop that look like tar. °· You have very bad pain, cramping, or bloating in your belly (abdomen). °· You have trouble breathing or you are breathing very quickly. °· Your heart is beating very quickly. °· Your skin feels cold and clammy. °· You feel confused. °· You have signs of dehydration, such as: °? Dark pee, hardly any pee, or no pee. °? Cracked lips. °? Dry mouth. °? Sunken eyes. °? Sleepiness. °? Weakness. °This information is not intended to replace advice given to you by your health care provider. Make sure you discuss any questions you have with your health care provider. °Document Released: 09/15/2007 Document Revised: 10/17/2015 Document Reviewed: 12/03/2014 °Elsevier Interactive Patient Education © 2018 [employer] Inc. ° °

## 2017-05-04 LAB — C. DIFFICILE GDH AND TOXIN A/B
GDH ANTIGEN: NOT DETECTED
MICRO NUMBER: 90092298
SPECIMEN QUALITY:: ADEQUATE
TOXIN A AND B: NOT DETECTED

## 2017-05-05 ENCOUNTER — Ambulatory Visit (INDEPENDENT_AMBULATORY_CARE_PROVIDER_SITE_OTHER): Payer: Medicare Other

## 2017-05-05 ENCOUNTER — Other Ambulatory Visit: Payer: Self-pay | Admitting: Family Medicine

## 2017-05-05 DIAGNOSIS — E349 Endocrine disorder, unspecified: Secondary | ICD-10-CM | POA: Diagnosis not present

## 2017-05-05 MED ORDER — TESTOSTERONE CYPIONATE 200 MG/ML IM SOLN: INTRAMUSCULAR | 1 refills | Status: DC

## 2017-05-05 MED ORDER — TESTOSTERONE CYPIONATE 200 MG/ML IM SOLN
150.0000 mg | Freq: Once | INTRAMUSCULAR | Status: AC
  Administered 2017-05-05: 150 mg via INTRAMUSCULAR

## 2017-05-05 NOTE — Telephone Encounter (Signed)
Copied from [place]. Topic: Quick Communication - See Telephone Encounter >> May 05, 2017  9:55 AM Bea Graff, NT wrote: CRM for notification. See Telephone encounter for: Pt needing a refill of Testerone injections. He has an appt at 11am today to get the shot but doesn't have enough for a full injection. Paramedic.   05/05/17.

## 2017-05-05 NOTE — Addendum Note (Signed)
Addended by: Carter Kitten on: 05/05/2017 12:04 PM   Modules accepted: Orders

## 2017-05-05 NOTE — Telephone Encounter (Signed)
I spoke with pt 's wife (pt in shower; DPR signed) and Dr Lorelei Pont out of office until 05/09/2017; will send note to Dr Lorelei Pont but may not get response by 11 AM today for nurse visit.(Dr Diona Browner said to send to Dr Lorelei Pont) Mrs Shoultz said pt thinks may have enough med to get inj today. Pt will keep nurse visit at 24 AM but still request testosterone rx to [facility]. Last refilled # 10 ml on 12/06/16. Last seen 04/07/17.

## 2017-05-05 NOTE — Telephone Encounter (Signed)
pts wife (DPR signed) requesting testosterone rx sent to [facility] since [facility] is [facility] today.

## 2017-05-05 NOTE — Telephone Encounter (Signed)
Pt  Requesting  Refill  Testosterone  Has  appt  Today    At  1100  Am  For injection  Does  Not have  Enough  For  Full injection

## 2017-05-05 NOTE — Telephone Encounter (Signed)
Received fax from pharmacy requesting clarification on dosing frequency.  Updated Rx to state every 14 days.  Will resend to Dr. Lorelei Pont to resend Rx to pharmacy.

## 2017-05-06 MED ORDER — TESTOSTERONE CYPIONATE 200 MG/ML IM SOLN: INTRAMUSCULAR | 1 refills | Status: DC

## 2017-05-07 LAB — STOOL CULTURE
MICRO NUMBER: 90092290
MICRO NUMBER: 90092292
MICRO NUMBER:: 90092291
SHIGA RESULT: NOT DETECTED
SPECIMEN QUALITY: ADEQUATE
SPECIMEN QUALITY:: ADEQUATE
SPECIMEN QUALITY:: ADEQUATE

## 2017-05-17 ENCOUNTER — Ambulatory Visit: Payer: Medicare Other | Admitting: Internal Medicine

## 2017-05-18 ENCOUNTER — Ambulatory Visit (INDEPENDENT_AMBULATORY_CARE_PROVIDER_SITE_OTHER): Payer: Medicare Other | Admitting: Family Medicine

## 2017-05-18 ENCOUNTER — Other Ambulatory Visit: Payer: Self-pay

## 2017-05-18 ENCOUNTER — Encounter: Payer: Self-pay | Admitting: Family Medicine

## 2017-05-18 VITALS — BP 158/70 | HR 68 | Temp 97.5°F | Ht 71.25 in | Wt 234.5 lb

## 2017-05-18 DIAGNOSIS — F4321 Adjustment disorder with depressed mood: Secondary | ICD-10-CM

## 2017-05-18 DIAGNOSIS — E291 Testicular hypofunction: Secondary | ICD-10-CM

## 2017-05-18 DIAGNOSIS — R238 Other skin changes: Secondary | ICD-10-CM | POA: Diagnosis not present

## 2017-05-18 DIAGNOSIS — T148XXA Other injury of unspecified body region, initial encounter: Secondary | ICD-10-CM

## 2017-05-18 MED ORDER — TESTOSTERONE CYPIONATE 200 MG/ML IM SOLN
150.0000 mg | Freq: Once | INTRAMUSCULAR | Status: AC
  Administered 2017-05-18: 150 mg via INTRAMUSCULAR

## 2017-05-18 MED ORDER — CEPHALEXIN 500 MG PO CAPS: 1000.0000 mg | ORAL_CAPSULE | Freq: Two times a day (BID) | ORAL | 0 refills | Status: DC

## 2017-05-18 NOTE — Progress Notes (Signed)
Dr. Frederico Hamman T. Ibrohim Simmers, MD, [name] [name], [address] Phone: [phone] Fax: [phone]  05/18/2017  Patient: [name], [name]: [id number], DOB: [birth year], 72 y.o.  Primary Physician:  [name], MD   Chief Complaint  Patient presents with  . Follow-up   Subjective:   [name] is a 72 y.o. very pleasant male patient who presents with the following:  Last injection was 14 days ago. He thinks everything has been going OK with his testosterone - they gave him 1 cc vials.  30 years of marriage.  He is pretty upset, his wife died suddenly 2 weeks ago.  They have already had the funeral, and they were life partners for many years.  He is doing what he can to cope.  Dorsum of the L foot. R ankle fell at [place]  He also has a couple small wounds, and these are superficial but reddened on the dorsum of the left foot as well as on the right ankle, which were both from trauma, but now they are a little bit reddened in the pinkish in coloration.  Past Medical History, Surgical History, Social History, Family History, Problem List, Medications, and Allergies have been reviewed and updated if relevant.  Patient Active Problem List   Diagnosis Date Noted  . Uncontrolled type 2 diabetes mellitus with peripheral neuropathy ([place]) 05/11/2016    Priority: Medium  . Former very heavy cigarette smoker (more than 40 per day) 10/07/2014    Priority: Medium  . Chronic kidney disease, stage III (moderate) ([name]) 12/19/2012    Priority: Medium  . OSA (obstructive sleep apnea) 03/09/2011    Priority: Medium  . Diabetic neuropathy ([name]) 12/29/2010    Priority: Medium  . Hypertriglyceridemia 06/13/2008    Priority: Medium  . Major depressive disorder, recurrent episode, in partial remission ([name]) 06/13/2008    Priority: Medium  . Essential hypertension 06/13/2008    Priority: Medium  . Fall at home, initial  encounter 03/02/2017  . Muscle spasm 11/05/2016  . Unsteady gait 10/10/2016  . Mild cognitive disorder 06/29/2016  . Erectile dysfunction associated with type 2 diabetes mellitus ([name]) 05/28/2015  . Hypogonadism male 06/14/2012  . Obesity (BMI 30-39.9) 09/27/2011  . History of smoking 09/16/2010  . Family history of MI (myocardial infarction) 09/16/2010  . HIP REPLACEMENT, RIGHT, HX OF 02/05/2010  . GOUT 06/13/2008  . ALLERGIC RHINITIS 06/13/2008  . GERD 06/13/2008  . OSTEOARTHRITIS 06/13/2008    Past Medical History:  Diagnosis Date  . Allergic rhinitis due to pollen   . Chronic kidney disease, stage III (moderate) ([name]) 12/19/2012  . Depression   . Diabetes mellitus ([name])   . Diverticulosis   . Erectile dysfunction associated with type 2 diabetes mellitus ([name]) 05/28/2015  . Former very heavy cigarette smoker (more than 40 per day) 10/07/2014  . GERD (gastroesophageal reflux disease)   . Gout   . Headache   . Hyperlipidemia   . Hypertension   . Hypogonadism male 06/14/2012  . Iron deficiency   . Low testosterone   . Memory loss   . Osteoarthritis   . Personal history of colonic polyps    1996    Past Surgical History:  Procedure Laterality Date  . CARDIAC CATHETERIZATION  11/2011   [facility]  . CHOLECYSTECTOMY  1980  . EYE SURGERY Right 07/29/2016   cat[facility]act - Dr. Manuella Ghazi  . EYE SURGERY Left 08/23/2106   cataract - Dr.  Manuella Ghazi  . TOTAL HIP ARTHROPLASTY  2009   Dr. Gladstone Lighter  . TOTAL HIP ARTHROPLASTY      Social History   Socioeconomic History  . Marital status: Married    Spouse name: Not on file  . Number of children: 4  . Years of education: 12th  . Highest education level: Not on file  Social Needs  . Financial resource strain: Not on file  . Food insecurity - worry: Not on file  . Food insecurity - inability: Not on file  . Transportation needs - medical: Not on file  . Transportation needs - non-medical: Not on file  Occupational History  . Occupation:  Counsellor  . Occupation: retired Magazine features editor: [employer]  Tobacco Use  . Smoking status: Former Smoker    Packs/day: 3.00    Years: 35.00    Pack years: 105.00    Types: Cigarettes    Last attempt to quit: 04/12/2000    Years since quitting: 17.1  . Smokeless tobacco: Never Used  Substance and Sexual Activity  . Alcohol use: No  . Drug use: No  . Sexual activity: No  Other Topics Concern  . Not on file  Social History Narrative   No regular exercise   Caffeine use: yes, dt coke   Lives at home with his wife and his mother lives with him.   Right-handed.    Family History  Problem Relation Age of Onset  . Diabetes Mother   . Alzheimer's disease Mother   . Diabetes Father   . Lung cancer Father        Age 44  . Stroke Father   . Heart attack Father   . Lung cancer Sister        lung  . Heart disease Maternal Grandfather   . COPD Sister   . Heart attack Sister   . Heart disease Sister     Allergies  Allergen Reactions  . Tetracycline Itching and Rash    Medication list reviewed and updated in full in [place].   GEN: No acute illnesses, no fevers, chills. GI: No n/v/d, eating normally Pulm: No SOB Interactive and getting along well at home.  Otherwise, ROS is as per the HPI.  Objective:   BP (!) 158/70   Pulse 68   Temp (!) 97.5 F (36.4 C) (Oral)   Ht 5' 11.25" (1.81 m)   Wt 234 lb 8 oz (106.4 kg)   BMI 32.48 kg/m   GEN: WDWN, NAD, Non-toxic, A & O x 3 HEENT: Atraumatic, Normocephalic. Neck supple. No masses, No LAD. Ears and Nose: No external deformity. CV: RRR, No M/G/R. No JVD. No thrill. No extra heart sounds. PULM: CTA B, no wheezes, crackles, rhonchi. No retractions. No resp. distress. No accessory muscle use. EXTR: No c/c/e NEURO Normal gait.  PSYCH: Normally interactive. Conversant. Not depressed or anxious appearing.  Calm demeanor.   SKIN: Dorsum of the left foot as well as right lateral ankle.  There is no  ulceration, but there is some pinkish reddish coloration.  Laboratory and Imaging Data:  Assessment and Plan:   Hypogonadism male - Plan: Testos,Total,Free and SHBG (Male), testosterone cypionate (DEPOTESTOSTERONE CYPIONATE) injection 150 mg  Grief reaction  Multiple wounds of skin  Check test level on day 14  >25 minutes spent in face to face time with patient, >50% spent in counselling or coordination of care: mostly spent on grief counselling.   Concern for ?  Cellulitic component - he does think they have improved over time.   Follow-up: No Follow-up on file.  Meds ordered this encounter  Medications  . cephALEXin (KEFLEX) 500 MG capsule    Sig: Take 2 capsules (1,000 mg total) by mouth 2 (two) times daily.    Dispense:  40 capsule    Refill:  0  . testosterone cypionate (DEPOTESTOSTERONE CYPIONATE) injection 150 mg   Medications Discontinued During This Encounter  Medication Reason  . amoxicillin (AMOXIL) 500 MG capsule Completed Course   Orders Placed This Encounter  Procedures  . Testos,Total,Free and SHBG (Male)    Signed,  Frederico Hamman T. Jaquez Farrington, MD   Allergies as of 05/18/2017      Reactions   Tetracycline Itching, Rash      Medication List        Accurate as of 05/18/17 11:59 PM. Always use your most recent med list.          aspirin EC 81 MG tablet Take 81 mg by mouth at bedtime.   atenolol 100 MG tablet Commonly known as:  TENORMIN TAKE ONE TABLET BY MOUTH ONCE DAILY   buPROPion 150 MG 12 hr tablet Commonly known as:  WELLBUTRIN SR Take 1 tablet (150 mg total) by mouth 2 (two) times daily.   cephALEXin 500 MG capsule Commonly known as:  KEFLEX Take 2 capsules (1,000 mg total) by mouth 2 (two) times daily.   cholecalciferol 1000 units tablet Commonly known as:  VITAMIN D Take 1,000 Units by mouth daily.   donepezil 10 MG tablet Commonly known as:  ARICEPT Take 1 tablet (10 mg total) by mouth at bedtime.   doxazosin 8 MG  tablet Commonly known as:  CARDURA Take 0.5 tablets (4 mg total) by mouth at bedtime.   fluticasone 50 MCG/ACT nasal spray Commonly known as:  FLONASE INHALE 2 SPRAYS INTO EACH NOSTRIL EVERY DAY AS NEEDED   gabapentin 300 MG capsule Commonly known as:  NEURONTIN Take 1 capsule (300 mg total) by mouth 2 (two) times daily.   ibuprofen 200 MG tablet Commonly known as:  ADVIL,MOTRIN Take 200-400 mg by mouth every 6 (six) hours as needed for headache (pain).   insulin NPH Human 100 UNIT/ML injection Commonly known as:  HUMULIN N,NOVOLIN N Inject 35 Units into the skin 2 (two) times daily before a meal.   insulin regular 100 units/mL injection Commonly known as:  NOVOLIN R,HUMULIN R Inject 35 Units into the skin 2 (two) times daily before a meal.   lisinopril-hydrochlorothiazide 20-12.5 MG tablet Commonly known as:  PRINZIDE,ZESTORETIC Take 1 tablet by mouth daily.   memantine 10 MG tablet Commonly known as:  NAMENDA Take 1 tablet (10 mg total) by mouth 2 (two) times daily.   sildenafil 20 MG tablet Commonly known as:  REVATIO Generic Revatio / Sildanefil 20 mg. 2 - 5 tabs 30 mins prior to intercourse.   simvastatin 40 MG tablet Commonly known as:  ZOCOR TAKE ONE TABLET BY MOUTH AT BEDTIME   testosterone cypionate 200 MG/ML injection Commonly known as:  DEPOTESTOSTERONE CYPIONATE INJECT 0.75 MLS (150 MG) INTO THE MUSCLE EVERY 14 DAYS   venlafaxine XR 75 MG 24 hr capsule Commonly known as:  EFFEXOR-XR TAKE THREE (3) CAPSULES BY MOUTH DAILY   ZANTAC 75 75 MG tablet Generic drug:  ranitidine Take 225 mg by mouth at bedtime.

## 2017-05-22 LAB — TESTOS,TOTAL,FREE AND SHBG (FEMALE)
Free Testosterone: 57.2 pg/mL (ref 30.0–135.0)
Sex Hormone Binding: 16 nmol/L — ABNORMAL LOW (ref 22–77)
Testosterone, Total, LC-MS-MS: 265 ng/dL (ref 250–1100)

## 2017-05-23 ENCOUNTER — Encounter: Payer: Self-pay | Admitting: *Deleted

## 2017-06-01 ENCOUNTER — Ambulatory Visit: Payer: Medicare Other

## 2017-06-02 ENCOUNTER — Ambulatory Visit (INDEPENDENT_AMBULATORY_CARE_PROVIDER_SITE_OTHER): Payer: Medicare Other | Admitting: *Deleted

## 2017-06-02 DIAGNOSIS — E291 Testicular hypofunction: Secondary | ICD-10-CM

## 2017-06-02 MED ORDER — TESTOSTERONE CYPIONATE 100 MG/ML IM SOLN
100.0000 mg | INTRAMUSCULAR | Status: DC
  Administered 2017-06-02: 100 mg via INTRAMUSCULAR

## 2017-06-02 NOTE — Progress Notes (Signed)
tes

## 2017-06-06 ENCOUNTER — Other Ambulatory Visit: Payer: Self-pay | Admitting: *Deleted

## 2017-06-06 MED ORDER — FLUTICASONE PROPIONATE 50 MCG/ACT NA SUSP: NASAL | 1 refills | Status: DC

## 2017-06-06 MED ORDER — MEMANTINE HCL 10 MG PO TABS: 10.0000 mg | ORAL_TABLET | Freq: Two times a day (BID) | ORAL | 1 refills | Status: DC

## 2017-06-06 MED ORDER — VENLAFAXINE HCL ER 75 MG PO CP24: ORAL_CAPSULE | ORAL | 1 refills | Status: DC

## 2017-06-06 MED ORDER — ATENOLOL 100 MG PO TABS: 100.0000 mg | ORAL_TABLET | Freq: Every day | ORAL | 1 refills | Status: DC

## 2017-06-06 MED ORDER — DONEPEZIL HCL 10 MG PO TABS: 10.0000 mg | ORAL_TABLET | Freq: Every day | ORAL | 1 refills | Status: DC

## 2017-06-06 MED ORDER — GABAPENTIN 300 MG PO CAPS: 300.0000 mg | ORAL_CAPSULE | Freq: Two times a day (BID) | ORAL | 1 refills | Status: DC

## 2017-06-06 MED ORDER — SIMVASTATIN 40 MG PO TABS: 40.0000 mg | ORAL_TABLET | Freq: Every day | ORAL | 1 refills | Status: DC

## 2017-06-07 ENCOUNTER — Other Ambulatory Visit: Payer: Self-pay | Admitting: Family Medicine

## 2017-06-10 ENCOUNTER — Encounter: Payer: Self-pay | Admitting: Family Medicine

## 2017-06-10 ENCOUNTER — Ambulatory Visit (INDEPENDENT_AMBULATORY_CARE_PROVIDER_SITE_OTHER): Payer: Medicare Other | Admitting: Family Medicine

## 2017-06-10 VITALS — BP 176/94 | HR 124 | Temp 98.0°F | Ht 71.25 in | Wt 235.5 lb

## 2017-06-10 DIAGNOSIS — I10 Essential (primary) hypertension: Secondary | ICD-10-CM

## 2017-06-10 DIAGNOSIS — L304 Erythema intertrigo: Secondary | ICD-10-CM | POA: Diagnosis not present

## 2017-06-10 DIAGNOSIS — L97529 Non-pressure chronic ulcer of other part of left foot with unspecified severity: Secondary | ICD-10-CM

## 2017-06-10 DIAGNOSIS — L97509 Non-pressure chronic ulcer of other part of unspecified foot with unspecified severity: Secondary | ICD-10-CM | POA: Insufficient documentation

## 2017-06-10 DIAGNOSIS — R000 Tachycardia, unspecified: Secondary | ICD-10-CM | POA: Diagnosis not present

## 2017-06-10 MED ORDER — NYSTATIN 100000 UNIT/GM EX CREA: 1.0000 "application " | TOPICAL_CREAM | Freq: Two times a day (BID) | CUTANEOUS | 0 refills | Status: DC

## 2017-06-10 MED ORDER — CLINDAMYCIN HCL 300 MG PO CAPS: 300.0000 mg | ORAL_CAPSULE | Freq: Three times a day (TID) | ORAL | 0 refills | Status: DC

## 2017-06-10 NOTE — Patient Instructions (Addendum)
Please take your medicines when you get home- your blood pressure and pulse are high   I will refill your nystatin cream   For the wound- keep clean with soap and water at least twice daily  Keep lightly covered You can use triple antibiotic ointment over the counter  No more peroxide  Take the clindamycin three times daily with food for a week  If any intolerable side effects let us know   If you develop more redness/swelling or pain let us know right away   Follow up with Dr Lorelei Pont next week for a re check

## 2017-06-10 NOTE — Progress Notes (Signed)
Subjective:    Patient ID: [name], male    DOB: [birth year], 72 y.o.   [name]: [id number]  HPI  72 yo pt of Dr Lorelei Pont presents for re check of wounds   He had a fall prior to visit on 2/6 (at [facility])  Injured dorsum of L foot and R ankle Was tx with keflex for possible infection (finished all of that )   R ankle is healed - scab/ much improved   The L foot wound is possibly worse  More redness than there was  Has a white spot but does not drain  Does not hurt (has neuropathy)  No fever   Has varicose veins   Of note he has hx of DM Blood sugars in 200s latelhy  Lab Results  Component Value Date   HGBA1C 9.5 11/30/2016   With DM neuropathy and CKD stage 3 Lab Results  Component Value Date   CREATININE 1.71 (H) 12/09/2016    No vascular dz is noted in his chart  Does have HTN   bp is high today BP Readings from Last 3 Encounters:  06/10/17 (!) 176/94  05/18/17 (!) 158/70  05/03/17 (!) 144/76  he takes atenolol 100 mg daily  cardura 4 mg at bedtime  lisinoprol hct 20-12.5   Pulse is also high Pulse Readings from Last 3 Encounters:  06/10/17 (!) 124  05/18/17 68  05/03/17 78   Pt states he missed his medicine today (atenolol)   Also needs a refill of a of nystatin cream for recurrent intertrigo around penis  Is not circumcised  Patient Active Problem List   Diagnosis Date Noted  . Foot ulcer ([place]) 06/10/2017  . Intertrigo 06/10/2017  . Fall at home, initial encounter 03/02/2017  . Muscle spasm 11/05/2016  . Unsteady gait 10/10/2016  . Mild cognitive disorder 06/29/2016  . Uncontrolled type 2 diabetes mellitus with peripheral neuropathy ([place]) 05/11/2016  . Erectile dysfunction associated with type 2 diabetes mellitus ([place]) 05/28/2015  . Former very heavy cigarette smoker (more than 40 per day) 10/07/2014  . Chronic kidney disease, stage III (moderate) ([place]) 12/19/2012  . Hypogonadism male 06/14/2012  . Obesity (BMI 30-39.9) 09/27/2011  . OSA  (obstructive sleep apnea) 03/09/2011  . Diabetic neuropathy ([place]) 12/29/2010  . History of smoking 09/16/2010  . Family history of MI (myocardial infarction) 09/16/2010  . HIP REPLACEMENT, RIGHT, HX OF 02/05/2010  . Hypertriglyceridemia 06/13/2008  . GOUT 06/13/2008  . Major depressive disorder, recurrent episode, in partial remission ([place]) 06/13/2008  . Essential hypertension 06/13/2008  . ALLERGIC RHINITIS 06/13/2008  . GERD 06/13/2008  . OSTEOARTHRITIS 06/13/2008   Past Medical History:  Diagnosis Date  . Allergic rhinitis due to pollen   . Chronic kidney disease, stage III (moderate) ([place]) 12/19/2012  . Depression   . Diabetes mellitus ([place])   . Diverticulosis   . Erectile dysfunction associated with type 2 diabetes mellitus ([place]) 05/28/2015  . Former very heavy cigarette smoker (more than 40 per day) 10/07/2014  . GERD (gastroesophageal reflux disease)   . Gout   . Headache   . Hyperlipidemia   . Hypertension   . Hypogonadism male 06/14/2012  . Iron deficiency   . Low testosterone   . Memory loss   . Osteoarthritis   . Personal history of colonic polyps    1996   Past Surgical History:  Procedure Laterality Date  . CARDIAC CATHETERIZATION  11/2011   [facility]  . CHOLECYSTECTOMY  1980  . EYE  SURGERY Right 07/29/2016   cataract - Dr. Manuella Ghazi  . EYE SURGERY Left 08/23/2106   cataract - Dr. Manuella Ghazi  . TOTAL HIP ARTHROPLASTY  2009   Dr. Gladstone Lighter  . TOTAL HIP ARTHROPLASTY     Social History   Tobacco Use  . Smoking status: Former Smoker    Packs/day: 3.00    Years: 35.00    Pack years: 105.00    Types: Cigarettes    Last attempt to quit: 04/12/2000    Years since quitting: 17.1  . Smokeless tobacco: Never Used  Substance Use Topics  . Alcohol use: No  . Drug use: No   Family History  Problem Relation Age of Onset  . Diabetes Mother   . Alzheimer's disease Mother   . Diabetes Father   . Lung cancer Father        Age 14  . Stroke Father   . Heart attack Father   . Lung  cancer Sister        lung  . Heart disease Maternal Grandfather   . COPD Sister   . Heart attack Sister   . Heart disease Sister    Allergies  Allergen Reactions  . Tetracycline Itching and Rash   Current Outpatient Medications on File Prior to Visit  Medication Sig Dispense Refill  . aspirin EC 81 MG tablet Take 81 mg by mouth at bedtime.    [name] Kitchen atenolol (TENORMIN) 100 MG tablet Take 1 tablet (100 mg total) by mouth daily. 90 tablet 1  . buPROPion (WELLBUTRIN SR) 150 MG 12 hr tablet TAKE 1 TABLET BY MOUTH TWICE A DAY 180 tablet 1  . cholecalciferol (VITAMIN D) 1000 UNITS tablet Take 1,000 Units by mouth daily.      [name] Kitchen donepezil (ARICEPT) 10 MG tablet Take 1 tablet (10 mg total) by mouth at bedtime. 90 tablet 1  . doxazosin (CARDURA) 8 MG tablet Take 0.5 tablets (4 mg total) by mouth at bedtime.    . fluticasone (FLONASE) 50 MCG/ACT nasal spray INHALE 2 SPRAYS INTO EACH NOSTRIL EVERY DAY AS NEEDED FOR CONGESTION 48 g 1  . gabapentin (NEURONTIN) 300 MG capsule Take 1 capsule (300 mg total) by mouth 2 (two) times daily. 180 capsule 1  . ibuprofen (ADVIL,MOTRIN) 200 MG tablet Take 200-400 mg by mouth every 6 (six) hours as needed for headache (pain).    . insulin NPH Human (HUMULIN N,NOVOLIN N) 100 UNIT/ML injection Inject 35 Units into the skin 2 (two) times daily before a meal.    . insulin regular (NOVOLIN R,HUMULIN R) 100 units/mL injection Inject 35 Units into the skin 2 (two) times daily before a meal.    . lisinopril-hydrochlorothiazide (PRINZIDE,ZESTORETIC) 20-12.5 MG tablet Take 1 tablet by mouth daily. 90 tablet 1  . memantine (NAMENDA) 10 MG tablet Take 1 tablet (10 mg total) by mouth 2 (two) times daily. 180 tablet 1  . ranitidine (ZANTAC 75) 75 MG tablet Take 225 mg by mouth at bedtime.     . sildenafil (REVATIO) 20 MG tablet Generic Revatio / Sildanefil 20 mg. 2 - 5 tabs 30 mins prior to intercourse. (Patient taking differently: Take 80-100 mg by mouth daily as needed (erectile  dysfunction - 30 minutes prior to intercourse). ) 10 tablet prn  . simvastatin (ZOCOR) 40 MG tablet Take 1 tablet (40 mg total) by mouth at bedtime. 90 tablet 1  . testosterone cypionate (DEPOTESTOSTERONE CYPIONATE) 200 MG/ML injection INJECT 0.75 MLS (150 MG) INTO THE MUSCLE EVERY 14 DAYS 10  mL 1  . venlafaxine XR (EFFEXOR-XR) 75 MG 24 hr capsule TAKE ONE CAPSULE (75 MG) BY MOUTH EVERY MORNING AND TWO CAPSULES (150 MG) AT NIGHT 270 capsule 1   Current Facility-Administered Medications on File Prior to Visit  Medication Dose Route Frequency Provider Last Rate Last Dose  . testosterone cypionate (DEPOTESTOTERONE CYPIONATE) injection 100 mg  100 mg Intramuscular Q14 Days Bedsole, Amy E, MD   100 mg at 06/02/17 1013     Review of Systems  Constitutional: Negative for activity change, appetite change, fatigue, fever and unexpected weight change.  HENT: Negative for congestion, rhinorrhea, sore throat and trouble swallowing.   Eyes: Negative for pain, redness, itching and visual disturbance.  Respiratory: Negative for cough, chest tightness, shortness of breath and wheezing.   Cardiovascular: Negative for chest pain and palpitations.  Gastrointestinal: Negative for abdominal pain, blood in stool, constipation, diarrhea and nausea.  Endocrine: Negative for cold intolerance, heat intolerance, polydipsia and polyuria.  Genitourinary: Negative for difficulty urinating, dysuria, frequency and urgency.  Musculoskeletal: Negative for arthralgias, joint swelling and myalgias.  Skin: Positive for rash and wound. Negative for pallor.  Neurological: Negative for dizziness, tremors, weakness, numbness and headaches.  Hematological: Negative for adenopathy. Does not bruise/bleed easily.  Psychiatric/Behavioral: Negative for decreased concentration and dysphoric mood. The patient is not nervous/anxious.        Objective:   Physical Exam  Constitutional: He appears well-developed and well-nourished. No  distress.  obese and well appearing   HENT:  Head: Normocephalic and atraumatic.  Eyes: Conjunctivae and EOM are normal. Pupils are equal, round, and reactive to light. No scleral icterus.  Neck: Normal range of motion. Neck supple.  Cardiovascular: Normal rate, regular rhythm and normal heart sounds.  dp pedis pulses are difficult to palpate  Warm feet with cool toes bilat (no color change)  Symmetric exam  Varicosities of LE noted  Pulmonary/Chest: Effort normal and breath sounds normal. No respiratory distress. He has no wheezes.  Musculoskeletal: He exhibits no edema or deformity.  Lymphadenopathy:    He has no cervical adenopathy.  Neurological: He is alert. He exhibits normal muscle tone. Coordination normal.  Reduced sens to lt touch in feet  Skin: Skin is warm and dry. No pallor.  Lateral left foot- 1 cm superficial wound with white base resembling granulation tissue (dry and not draining) 2-3 mm collar or erythema  Very slightly tender    Psychiatric: He has a normal mood and affect.          Assessment & Plan:   Problem List Items Addressed This Visit      Cardiovascular and Mediastinum   Essential hypertension (Chronic)    BP: (!) 176/94    Pt forgot medication today  Pulse also high Will take as soon as he gets home F/u pcp next week for this and foot wound          Musculoskeletal and Integument   Intertrigo    Pt mentions at end of visit he needs refill of nystatin cream for intertrigo of penis (uncirc male) Disc hygiene and keeping area dry Refilled cream        Other   Foot ulcer ([place]) - Primary    Wound on L foot may be turning into ulcer in diabetic with neuropathy and unclear vascular status Failed keflex Cover with clindamycin  Re check next week  Disc wound care -soap/water (not peroxide) bid / abx oint otc and lose covering if needed  F/u with  PCP next week but alert earlier if any worsening       Tachycardia    Pulse Rate: (!)  124   asymptomatic  Pt did not take his atenolol today-no doubt this is the problem Rhythm is regular inst him to take as soon as he gets home  F/u pcp next week

## 2017-06-12 NOTE — Assessment & Plan Note (Signed)
Pt mentions at end of visit he needs refill of nystatin cream for intertrigo of penis (uncirc male) Disc hygiene and keeping area dry Refilled cream

## 2017-06-12 NOTE — Assessment & Plan Note (Signed)
Pulse Rate: (!) 124   asymptomatic  Pt did not take his atenolol today-no doubt this is the problem Rhythm is regular inst him to take as soon as he gets home  F/u pcp next week

## 2017-06-12 NOTE — Assessment & Plan Note (Signed)
Wound on L foot may be turning into ulcer in diabetic with neuropathy and unclear vascular status Failed keflex Cover with clindamycin  Re check next week  Disc wound care -soap/water (not peroxide) bid / abx oint otc and lose covering if needed  F/u with PCP next week but alert earlier if any worsening

## 2017-06-12 NOTE — Assessment & Plan Note (Signed)
BP: (!) 176/94    Pt forgot medication today  Pulse also high Will take as soon as he gets home F/u pcp next week for this and foot wound

## 2017-06-15 ENCOUNTER — Ambulatory Visit: Payer: Medicare Other | Admitting: Family Medicine

## 2017-06-16 ENCOUNTER — Ambulatory Visit (INDEPENDENT_AMBULATORY_CARE_PROVIDER_SITE_OTHER): Payer: Medicare Other

## 2017-06-16 DIAGNOSIS — E349 Endocrine disorder, unspecified: Secondary | ICD-10-CM | POA: Diagnosis not present

## 2017-06-16 DIAGNOSIS — E291 Testicular hypofunction: Secondary | ICD-10-CM | POA: Diagnosis not present

## 2017-06-16 MED ORDER — TESTOSTERONE CYPIONATE 200 MG/ML IM SOLN
150.0000 mg | Freq: Once | INTRAMUSCULAR | Status: AC
  Administered 2017-06-16: 150 mg via INTRAMUSCULAR

## 2017-06-16 NOTE — Addendum Note (Signed)
Addended by: Modena Nunnery on: 06/16/2017 09:22 AM   Modules accepted: Orders

## 2017-06-22 ENCOUNTER — Encounter: Payer: Self-pay | Admitting: Family Medicine

## 2017-06-22 ENCOUNTER — Other Ambulatory Visit: Payer: Self-pay | Admitting: Family Medicine

## 2017-06-22 ENCOUNTER — Ambulatory Visit (INDEPENDENT_AMBULATORY_CARE_PROVIDER_SITE_OTHER): Payer: Medicare Other | Admitting: Family Medicine

## 2017-06-22 ENCOUNTER — Other Ambulatory Visit: Payer: Self-pay

## 2017-06-22 VITALS — BP 120/60 | HR 72 | Temp 98.6°F | Ht 71.25 in | Wt 233.5 lb

## 2017-06-22 DIAGNOSIS — Z79899 Other long term (current) drug therapy: Secondary | ICD-10-CM | POA: Diagnosis not present

## 2017-06-22 DIAGNOSIS — E1142 Type 2 diabetes mellitus with diabetic polyneuropathy: Secondary | ICD-10-CM | POA: Diagnosis not present

## 2017-06-22 DIAGNOSIS — N183 Chronic kidney disease, stage 3 unspecified: Secondary | ICD-10-CM

## 2017-06-22 DIAGNOSIS — N521 Erectile dysfunction due to diseases classified elsewhere: Secondary | ICD-10-CM

## 2017-06-22 DIAGNOSIS — IMO0002 Reserved for concepts with insufficient information to code with codable children: Secondary | ICD-10-CM

## 2017-06-22 DIAGNOSIS — E1165 Type 2 diabetes mellitus with hyperglycemia: Secondary | ICD-10-CM

## 2017-06-22 DIAGNOSIS — E1169 Type 2 diabetes mellitus with other specified complication: Secondary | ICD-10-CM | POA: Diagnosis not present

## 2017-06-22 DIAGNOSIS — E0842 Diabetes mellitus due to underlying condition with diabetic polyneuropathy: Secondary | ICD-10-CM

## 2017-06-22 DIAGNOSIS — L97522 Non-pressure chronic ulcer of other part of left foot with fat layer exposed: Secondary | ICD-10-CM | POA: Diagnosis not present

## 2017-06-22 DIAGNOSIS — I10 Essential (primary) hypertension: Secondary | ICD-10-CM | POA: Diagnosis not present

## 2017-06-22 DIAGNOSIS — E781 Pure hyperglyceridemia: Secondary | ICD-10-CM | POA: Diagnosis not present

## 2017-06-22 DIAGNOSIS — E08621 Diabetes mellitus due to underlying condition with foot ulcer: Secondary | ICD-10-CM | POA: Diagnosis not present

## 2017-06-22 LAB — CBC WITH DIFFERENTIAL/PLATELET
BASOS PCT: 0.3 % (ref 0.0–3.0)
Basophils Absolute: 0 10*3/uL (ref 0.0–0.1)
EOS ABS: 0.1 10*3/uL (ref 0.0–0.7)
Eosinophils Relative: 0.9 % (ref 0.0–5.0)
HEMATOCRIT: 45.1 % (ref 39.0–52.0)
Hemoglobin: 15.8 g/dL (ref 13.0–17.0)
Lymphocytes Relative: 15.7 % (ref 12.0–46.0)
Lymphs Abs: 1.3 10*3/uL (ref 0.7–4.0)
MCHC: 35.1 g/dL (ref 30.0–36.0)
MCV: 89.4 fl (ref 78.0–100.0)
Monocytes Absolute: 0.8 10*3/uL (ref 0.1–1.0)
Monocytes Relative: 9.8 % (ref 3.0–12.0)
Neutro Abs: 5.9 10*3/uL (ref 1.4–7.7)
Neutrophils Relative %: 73.3 % (ref 43.0–77.0)
Platelets: 190 10*3/uL (ref 150.0–400.0)
RBC: 5.04 Mil/uL (ref 4.22–5.81)
RDW: 12.8 % (ref 11.5–15.5)
WBC: 8.1 10*3/uL (ref 4.0–10.5)

## 2017-06-22 LAB — BASIC METABOLIC PANEL
BUN: 21 mg/dL (ref 6–23)
CHLORIDE: 92 meq/L — AB (ref 96–112)
CO2: 30 mEq/L (ref 19–32)
CREATININE: 1.69 mg/dL — AB (ref 0.40–1.50)
Calcium: 9.6 mg/dL (ref 8.4–10.5)
GFR: 42.65 mL/min — ABNORMAL LOW (ref >60.00)
Glucose, Bld: 453 mg/dL — ABNORMAL HIGH (ref 70–99)
POTASSIUM: 4.5 meq/L (ref 3.5–5.1)
Sodium: 130 mEq/L — ABNORMAL LOW (ref 135–145)

## 2017-06-22 LAB — HEPATIC FUNCTION PANEL
ALK PHOS: 85 U/L (ref 39–117)
ALT: 15 U/L (ref 0–53)
AST: 9 U/L (ref 0–37)
Albumin: 4.2 g/dL (ref 3.5–5.2)
BILIRUBIN DIRECT: 0.2 mg/dL (ref 0.0–0.3)
TOTAL PROTEIN: 7.2 g/dL (ref 6.0–8.3)
Total Bilirubin: 0.6 mg/dL (ref 0.2–1.2)

## 2017-06-22 LAB — LIPID PANEL
CHOLESTEROL: 127 mg/dL (ref 0–200)
HDL: 31.4 mg/dL — ABNORMAL LOW (ref >39.00)
Total CHOL/HDL Ratio: 4
Triglycerides: 552 mg/dL — ABNORMAL HIGH (ref 0.0–149.0)

## 2017-06-22 LAB — LDL CHOLESTEROL, DIRECT: Direct LDL: 55 mg/dL

## 2017-06-22 LAB — HEMOGLOBIN A1C: Hgb A1c MFr Bld: 10.3 % — ABNORMAL HIGH (ref 4.6–6.5)

## 2017-06-22 NOTE — Progress Notes (Signed)
Dr. Frederico Hamman T. Blanche Scovell, MD, [facility] [place], [address] Phone: [phone] Fax: [phone]  06/22/2017  Patient: [name], [name]: [id number], DOB: 1945-12-25, 72 y.o.  Primary Physician:  [name], MD   Chief Complaint  Patient presents with  . Diabetes  . Hyperlipidemia  . Foot Ulcer   Subjective:   [name] is a 72 y.o. very pleasant male patient who presents with the following:  Globally the patient's not doing all that well, his wife recently died, his diabetes is very poorly controlled, he was lost to follow-up somewhat with endocrinology.  He is insulin-dependent.  Blood sugars are quite high, often above 400.  He also developed a foot ulcer on the dorsum of his left foot approximately 2 months ago, and this is not healed.  F/u foot ulcer: 2 mo. L foot ulcer, 1 cm.  It is not tender, but he does have approximate 1 cm open ulcer and in total it is approximately 3 cm across including the adjacent reddish tissue that is not warm.  Lipids? Wildly out of control triglycerides.   DM: 220 - 400+ Intermittently controlled DM  Cm and surrounding redness, s/p 2 rounds of abx.   Chest pain, an hour or 2. Clean cath. Completely resolved > 1 week ago, previous work-ups including clean catheterization.  Past Medical History, Surgical History, Social History, Family History, Problem List, Medications, and Allergies have been reviewed and updated if relevant.  Patient Active Problem List   Diagnosis Date Noted  . Uncontrolled type 2 diabetes mellitus with peripheral neuropathy ([place]) 05/11/2016    Priority: Medium  . Former very heavy cigarette smoker (more than 40 per day) 10/07/2014    Priority: Medium  . Chronic kidney disease, stage III (moderate) ([place]) 12/19/2012    Priority: Medium  . OSA (obstructive sleep apnea) 03/09/2011    Priority: Medium  . Diabetic neuropathy ([place]) 12/29/2010   Priority: Medium  . Hypertriglyceridemia 06/13/2008    Priority: Medium  . Major depressive disorder, recurrent episode, in partial remission ([place]) 06/13/2008    Priority: Medium  . Essential hypertension 06/13/2008    Priority: Medium  . Foot ulcer ([place]) 06/10/2017  . Intertrigo 06/10/2017  . Fall at home, initial encounter 03/02/2017  . Muscle spasm 11/05/2016  . Unsteady gait 10/10/2016  . Mild cognitive disorder 06/29/2016  . Erectile dysfunction associated with type 2 diabetes mellitus ([name]) 05/28/2015  . Tachycardia 09/10/2014  . Hypogonadism male 06/14/2012  . Obesity (BMI 30-39.9) 09/27/2011  . History of smoking 09/16/2010  . Family history of MI (myocardial infarction) 09/16/2010  . HIP REPLACEMENT, RIGHT, HX OF 02/05/2010  . GOUT 06/13/2008  . ALLERGIC RHINITIS 06/13/2008  . GERD 06/13/2008  . OSTEOARTHRITIS 06/13/2008    Past Medical History:  Diagnosis Date  . Allergic rhinitis due to pollen   . Chronic kidney disease, stage III (moderate) ([place]) 12/19/2012  . Depression   . Diabetes mellitus ([place])   . Diverticulosis   . Erectile dysfunction associated with type 2 diabetes mellitus ([place]) 05/28/2015  . Former very heavy cigarette smoker (more than 40 per day) 10/07/2014  . GERD (gastroesophageal reflux disease)   . Gout   . Headache   . Hyperlipidemia   . Hypertension   . Hypogonadism male 06/14/2012  . Iron deficiency   . Low testosterone   . Memory loss   . Osteoarthritis   . Personal history of colonic polyps  1996    Past Surgical History:  Procedure Laterality Date  . CARDIAC CATHETERIZATION  11/2011   [facility]  . CHOLECYSTECTOMY  1980  . EYE SURGERY Right 07/29/2016   cataract - Dr. Manuella Ghazi  . EYE SURGERY Left 08/23/2106   cataract - Dr. Manuella Ghazi  . TOTAL HIP ARTHROPLASTY  2009   Dr. Gladstone Lighter  . TOTAL HIP ARTHROPLASTY      Social History   Socioeconomic History  . Marital status: Married    Spouse name: Not on file  . Number of children: 4  . Years  of education: 12th  . Highest education level: Not on file  Social Needs  . Financial resource strain: Not on file  . Food insecurity - worry: Not on file  . Food insecurity - inability: Not on file  . Transportation needs - medical: Not on file  . Transportation needs - non-medical: Not on file  Occupational History  . Occupation: Counsellor  . Occupation: retired Magazine features editor: [employer]  Tobacco Use  . Smoking status: Former Smoker    Packs/day: 3.00    Years: 35.00    Pack years: 105.00    Types: Cigarettes    Last attempt to quit: 04/12/2000    Years since quitting: 17.2  . Smokeless tobacco: Never Used  Substance and Sexual Activity  . Alcohol use: No  . Drug use: No  . Sexual activity: No  Other Topics Concern  . Not on file  Social History Narrative   No regular exercise   Caffeine use: yes, dt coke   Lives at home with his wife and his mother lives with him.   Right-handed.    Family History  Problem Relation Age of Onset  . Diabetes Mother   . Alzheimer's disease Mother   . Diabetes Father   . Lung cancer Father        Age 85  . Stroke Father   . Heart attack Father   . Lung cancer Sister        lung  . Heart disease Maternal Grandfather   . COPD Sister   . Heart attack Sister   . Heart disease Sister     Allergies  Allergen Reactions  . Tetracycline Itching and Rash    Medication list reviewed and updated in full in [place].   GEN: No acute illnesses, no fevers, chills. GI: No n/v/d, eating normally Pulm: No SOB Interactive and getting along well at home.  Otherwise, ROS is as per the HPI.  Objective:   BP 120/60   Pulse 72   Temp 98.6 F (37 C) (Oral)   Ht 5' 11.25" (1.81 m)   Wt 233 lb 8 oz (105.9 kg)   BMI 32.34 kg/m   GEN: WDWN, NAD, Non-toxic, A & O x 3 HEENT: Atraumatic, Normocephalic. Neck supple. No masses, No LAD. Ears and Nose: No external deformity. CV: RRR, No M/G/R. No JVD. No thrill. No  extra heart sounds. PULM: CTA B, no wheezes, crackles, rhonchi. No retractions. No resp. distress. No accessory muscle use. EXTR: No c/c/e NEURO Normal gait.  PSYCH: Normally interactive. Conversant. Not depressed or anxious appearing.  Calm demeanor.   Dorsum of L foot with 1 cm open ulcer, yellowish wet coloration with surrounding NT red tissue that is not warm.  Laboratory and Imaging Data: Results for orders placed or performed in visit on 16/10/96  Basic metabolic panel  Result Value Ref Range  Sodium 130 (L) 135 - 145 mEq/L   Potassium 4.5 3.5 - 5.1 mEq/L   Chloride 92 (L) 96 - 112 mEq/L   CO2 30 19 - 32 mEq/L   Glucose, Bld 453 (H) 70 - 99 mg/dL   BUN 21 6 - 23 mg/dL   Creatinine, Ser 1.69 (H) 0.40 - 1.50 mg/dL   Calcium 9.6 8.4 - 10.5 mg/dL   GFR 42.65 (L) >60.00 mL/min  CBC with Differential/Platelet  Result Value Ref Range   WBC 8.1 4.0 - 10.5 K/uL   RBC 5.04 4.22 - 5.81 Mil/uL   Hemoglobin 15.8 13.0 - 17.0 g/dL   HCT 45.1 39.0 - 52.0 %   MCV 89.4 78.0 - 100.0 fl   MCHC 35.1 30.0 - 36.0 g/dL   RDW 12.8 11.5 - 15.5 %   Platelets 190.0 150.0 - 400.0 K/uL   Neutrophils Relative % 73.3 43.0 - 77.0 %   Lymphocytes Relative 15.7 12.0 - 46.0 %   Monocytes Relative 9.8 3.0 - 12.0 %   Eosinophils Relative 0.9 0.0 - 5.0 %   Basophils Relative 0.3 0.0 - 3.0 %   Neutro Abs 5.9 1.4 - 7.7 K/uL   Lymphs Abs 1.3 0.7 - 4.0 K/uL   Monocytes Absolute 0.8 0.1 - 1.0 K/uL   Eosinophils Absolute 0.1 0.0 - 0.7 K/uL   Basophils Absolute 0.0 0.0 - 0.1 K/uL  Hepatic function panel  Result Value Ref Range   Total Bilirubin 0.6 0.2 - 1.2 mg/dL   Bilirubin, Direct 0.2 0.0 - 0.3 mg/dL   Alkaline Phosphatase 85 39 - 117 U/L   AST 9 0 - 37 U/L   ALT 15 0 - 53 U/L   Total Protein 7.2 6.0 - 8.3 g/dL   Albumin 4.2 3.5 - 5.2 g/dL  Hemoglobin A1c  Result Value Ref Range   Hgb A1c MFr Bld 10.3 (H) 4.6 - 6.5 %  Lipid panel  Result Value Ref Range   Cholesterol 127 0 - 200 mg/dL    Triglycerides (H) 0.0 - 149.0 mg/dL    552.0 Triglyceride is over 400; calculations on Lipids are invalid.   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 associated with diabetes mellitus due to underlying condition, with fat layer exposed ([place]) - Plan: AMB referral to wound care center  Uncontrolled type 2 diabetes mellitus with peripheral neuropathy ([place]) - Plan: Hemoglobin A1c  Hypertriglyceridemia - Plan: Lipid panel  Essential hypertension  Diabetic polyneuropathy associated with diabetes mellitus due to underlying condition ([facility])  Chronic kidney disease, stage III (moderate) ([facility]) - Plan: Basic metabolic panel  Erectile dysfunction associated with type 2 diabetes mellitus ([place])  Encounter for long-term (current) use of medications - Plan: CBC with Differential/Platelet, Hepatic function panel  Globally not doing well.  Very poorly controlled diabetes.  Open ulcer, left dorsum of the foot.  Failure of 2 rounds of antibiotics.  I am going to consult the wound care center for their assistance and expertise.  I strongly urged him to follow-up again with endocrinology.  Frankly, I am not sure what to do for the management of his diabetes.  Hopefully he will be somewhat compliant.  Exceedingly high triglycerides.  Still somewhat grieving over the loss of his wife.  Follow-up: No Follow-up on file.  Medications Discontinued During This Encounter  Medication Reason  . clindamycin (CLEOCIN) 300 MG capsule Completed Course   Orders Placed This Encounter  Procedures  . Basic metabolic panel  . CBC with Differential/Platelet  . Hepatic function panel  . Hemoglobin A1c  . Lipid panel  . LDL cholesterol, direct  . AMB referral to wound care center    Signed,  Aalaiyah Yassin T. Ronda Kazmi, MD   Allergies as of 06/22/2017       Reactions   Tetracycline Itching, Rash      Medication List        Accurate as of 06/22/17 11:59 PM. Always use your most recent med list.          aspirin EC 81 MG tablet Take 81 mg by mouth at bedtime.   atenolol 100 MG tablet Commonly known as:  TENORMIN Take 1 tablet (100 mg total) by mouth daily.   buPROPion 150 MG 12 hr tablet Commonly known as:  WELLBUTRIN SR TAKE 1 TABLET BY MOUTH TWICE A DAY   cholecalciferol 1000 units tablet Commonly known as:  VITAMIN D Take 1,000 Units by mouth daily.   donepezil 10 MG tablet Commonly known as:  ARICEPT Take 1 tablet (10 mg total) by mouth at bedtime.   doxazosin 8 MG tablet Commonly known as:  CARDURA Take 0.5 tablets (4 mg total) by mouth at bedtime.   fluticasone 50 MCG/ACT nasal spray Commonly known as:  FLONASE INHALE 2 SPRAYS INTO EACH NOSTRIL EVERY DAY AS NEEDED FOR CONGESTION   gabapentin 300 MG capsule Commonly known as:  NEURONTIN Take 1 capsule (300 mg total) by mouth 2 (two) times daily.   ibuprofen 200 MG tablet Commonly known as:  ADVIL,MOTRIN Take 200-400 mg by mouth every 6 (six) hours as needed for headache (pain).   insulin NPH Human 100 UNIT/ML injection Commonly known as:  HUMULIN N,NOVOLIN N Inject 35 Units into the skin 2 (two) times daily before a meal.   insulin regular 100 units/mL injection Commonly known as:  NOVOLIN R,HUMULIN R Inject 35 Units into the skin 2 (two) times daily before a meal.   lisinopril-hydrochlorothiazide 20-12.5 MG tablet Commonly known as:  PRINZIDE,ZESTORETIC Take 1 tablet by mouth daily.   memantine 10 MG tablet Commonly known as:  NAMENDA Take 1 tablet (10 mg total) by mouth 2 (two) times daily.   nystatin cream Commonly known as:  MYCOSTATIN Apply 1 application topically 2 (two) times daily.   sildenafil 20 MG tablet Commonly known as:  REVATIO Generic Revatio / Sildanefil 20 mg. 2 - 5 tabs 30 mins prior to intercourse.   simvastatin 40 MG  tablet Commonly known as:  ZOCOR TAKE ONE TABLET BY MOUTH EVERY NIGHT AT BEDTIME   testosterone cypionate 200 MG/ML injection Commonly known as:  DEPOTESTOSTERONE CYPIONATE INJECT 0.75 MLS (150 MG) INTO THE MUSCLE EVERY 14 DAYS   venlafaxine XR 75 MG 24 hr capsule Commonly known as:  EFFEXOR-XR TAKE ONE CAPSULE (75 MG) BY MOUTH EVERY MORNING AND TWO CAPSULES (150 MG) AT NIGHT   ZANTAC 75 75 MG tablet Generic drug:  ranitidine Take 225 mg by mouth at bedtime.

## 2017-06-22 NOTE — Patient Instructions (Signed)
REFERRALS TO SPECIALISTS, SPECIAL TESTS (MRI, CT, ULTRASOUNDS)  [facility] or [facility] will help you. ASK CHECK-IN FOR HELP.  Imaging / Special Testing referrals sometimes can be done same day if EMERGENCY, but others can take 2 or 3 days to get an appointment. Starting in 2015, many of the new Medicare plans and Obamacare plans take much longer.   Specialist appointment times vary a great deal, based on their schedule / openings. -- Some specialists have very long wait times. (Example. Dermatology. Multiple months  for non-cancer)   

## 2017-06-24 ENCOUNTER — Telehealth: Payer: Self-pay

## 2017-06-24 ENCOUNTER — Encounter: Payer: Medicare Other | Attending: Physician Assistant | Admitting: Physician Assistant

## 2017-06-24 DIAGNOSIS — Z87891 Personal history of nicotine dependence: Secondary | ICD-10-CM | POA: Diagnosis not present

## 2017-06-24 DIAGNOSIS — E11621 Type 2 diabetes mellitus with foot ulcer: Secondary | ICD-10-CM | POA: Diagnosis not present

## 2017-06-24 DIAGNOSIS — I129 Hypertensive chronic kidney disease with stage 1 through stage 4 chronic kidney disease, or unspecified chronic kidney disease: Secondary | ICD-10-CM | POA: Insufficient documentation

## 2017-06-24 DIAGNOSIS — N183 Chronic kidney disease, stage 3 (moderate): Secondary | ICD-10-CM | POA: Diagnosis not present

## 2017-06-24 DIAGNOSIS — L97522 Non-pressure chronic ulcer of other part of left foot with fat layer exposed: Secondary | ICD-10-CM | POA: Diagnosis not present

## 2017-06-24 DIAGNOSIS — Z8673 Personal history of transient ischemic attack (TIA), and cerebral infarction without residual deficits: Secondary | ICD-10-CM | POA: Diagnosis not present

## 2017-06-24 DIAGNOSIS — L97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 with fat layer exposed: Secondary | ICD-10-CM | POA: Insufficient documentation

## 2017-06-24 DIAGNOSIS — E1122 Type 2 diabetes mellitus with diabetic chronic kidney disease: Secondary | ICD-10-CM | POA: Insufficient documentation

## 2017-06-24 DIAGNOSIS — E1140 Type 2 diabetes mellitus with diabetic neuropathy, unspecified: Secondary | ICD-10-CM | POA: Insufficient documentation

## 2017-06-24 DIAGNOSIS — G4730 Sleep apnea, unspecified: Secondary | ICD-10-CM | POA: Insufficient documentation

## 2017-06-24 DIAGNOSIS — E11622 Type 2 diabetes mellitus with other skin ulcer: Secondary | ICD-10-CM | POA: Diagnosis not present

## 2017-06-24 NOTE — Telephone Encounter (Signed)
Started in error

## 2017-06-25 NOTE — Progress Notes (Signed)
[name], [name] ([id number]) Visit Report for 06/24/2017 Abuse/Suicide Risk Screen Details Patient Name: [name], [name] Date of Service: 06/24/2017 9:45 AM Medical Record Number: [id number] Patient Account Number: [id number] Date of Birth/Sex: 08-14-1945 (72 y.o. Male) Treating RN: Roger Shelter Primary Care Iwalani Templeton: [name] Other Clinician: Referring Jeremyah Jelley: [name] Treating Antasia Haider/Extender: [name], HOYT Weeks in Treatment: 0 Abuse/Suicide Risk Screen Items Answer ABUSE/SUICIDE RISK SCREEN: Do you feel uncomfortable with anyone in your familyo No Has anyone forced you do things that you didnot want to doo No Do you have any thoughts of harming yourselfo No Patient displays signs or symptoms of abuse and/or neglect. No Electronic Signature(s) Signed: 06/24/2017 3:21:12 PM By: Roger Shelter Entered By: Roger Shelter on 06/24/2017 10:20:09 [name] ([id number]) -------------------------------------------------------------------------------- Activities of Daily Living Details Patient Name: [name] Date of Service: 06/24/2017 9:45 AM Medical Record Number: [id number] Patient Account Number: [id number] Date of Birth/Sex: 1946/01/29 (72 y.o. Male) Treating RN: Roger Shelter Primary Care Lyndia Bury: [name] Other Clinician: Referring Nathasha Fiorillo: [name] Treating Jaelene Garciagarcia/Extender: Melburn Hake, HOYT Weeks in Treatment: 0 Activities of Daily Living Items Answer Activities of Daily Living (Please select one for each item) Drive Automobile Completely Able Take Medications Completely Able Use Telephone Completely Able Care for Appearance Completely Able Use Toilet Completely Able Bath / Shower Completely Able Dress Self Completely Able Feed Self Completely Able Walk Completely Able Get In / Out Bed Completely Able Housework Completely Able Prepare Meals Completely [place] for Self  Completely Able Electronic Signature(s) Signed: 06/24/2017 3:21:12 PM By: Roger Shelter Entered By: Roger Shelter on 06/24/2017 10:20:30 [name] ([id number]) -------------------------------------------------------------------------------- Education Assessment Details Patient Name: [name] Date of Service: 06/24/2017 9:45 AM Medical Record Number: [id number] Patient Account Number: [id number] Date of Birth/Sex: April 03, 1946 (72 y.o. Male) Treating RN: Roger Shelter Primary Care Sharmain Lastra: [name] Other Clinician: Referring Delaynie Stetzer: [name] Treating Sahara Fujimoto/Extender: Melburn Hake, HOYT Weeks in Treatment: 0 Learning Preferences/Education Level/Primary Language Learning Preference: Explanation Highest Education Level: High School Preferred Language: English Cognitive Barrier Assessment/Beliefs Language Barrier: No Translator Needed: No Memory Deficit: No Emotional Barrier: No Cultural/Religious Beliefs Affecting Medical Care: No Physical Barrier Assessment Impaired Vision: Yes Glasses Impaired Hearing: No Decreased Hand dexterity: No Knowledge/Comprehension Assessment Knowledge Level: High Comprehension Level: High Ability to understand written High instructions: Ability to understand verbal High instructions: Motivation Assessment Anxiety Level: Calm Cooperation: Cooperative Education Importance: Acknowledges Need Interest in Health Problems: Asks Questions Perception: Coherent Willingness to Engage in Self- High Management Activities: Readiness to Engage in Self- High Management Activities: Electronic Signature(s) Signed: 06/24/2017 3:21:12 PM By: Roger Shelter Entered By: Roger Shelter on 06/24/2017 10:21:01 [name] ([id number]) -------------------------------------------------------------------------------- Fall Risk Assessment Details Patient Name: [name] Date of Service: 06/24/2017 9:45  AM Medical Record Number: [id number] Patient Account Number: [id number] Date of Birth/Sex: [birth year] (72 y.o. Male) Treating RN: Roger Shelter Primary Care [name] Shedlock: [name] Other Clinician: Referring Muneer Leider: [name] Treating Jovontae Banko/Extender: Melburn Hake, HOYT Weeks in Treatment: 0 Fall Risk Assessment Items Have you had 2 or more falls in the last 12 monthso 0 No Have you had any fall that resulted in injury in the last 12 monthso 0 No FALL RISK ASSESSMENT: History of falling - immediate or within 3 months 0 No Secondary diagnosis 0 No Ambulatory aid None/bed rest/wheelchair/nurse 0 No Crutches/cane/walker 0 No Furniture 0 No IV Access/Saline Lock 0 No Gait/Training Normal/bed rest/immobile 0 No Weak 0 No Impaired 0 No  Mental Status Oriented to own ability 0 No Electronic Signature(s) Signed: 06/24/2017 3:21:12 PM By: Roger Shelter Entered By: Roger Shelter on 06/24/2017 10:21:10 [name] ([id number]) -------------------------------------------------------------------------------- Foot Assessment Details Patient Name: [name] Date of Service: 06/24/2017 9:45 AM Medical Record Number: [id number] Patient Account Number: [id number] Date of Birth/Sex: 25-Feb-1946 (72 y.o. Male) Treating RN: Roger Shelter Primary Care Caydon Feasel: [name] Other Clinician: Referring Uchechukwu Dhawan: [name] Treating Baraka Klatt/Extender: Melburn Hake, HOYT Weeks in Treatment: 0 Foot Assessment Items Site Locations + = Sensation present, - = Sensation absent, C = Callus, U = Ulcer R = Redness, W = Warmth, M = Maceration, PU = Pre-ulcerative lesion F = Fissure, S = Swelling, D = Dryness Assessment Right: Left: Other Deformity: No No Prior Foot Ulcer: No No Prior Amputation: No No Charcot Joint: No No Ambulatory Status: Ambulatory Without Help Gait: Steady Electronic Signature(s) Signed: 06/24/2017 3:21:12 PM By: Roger Shelter Entered By:  Roger Shelter on 06/24/2017 10:22:15 [name] ([id number]) -------------------------------------------------------------------------------- Nutrition Risk Assessment Details Patient Name: [name] Date of Service: 06/24/2017 9:45 AM Medical Record Number: [id number] Patient Account Number: [id number] Date of Birth/Sex: Oct 26, 1945 (73 y.o. Male) Treating RN: Roger Shelter Primary Care Julya Alioto: [name] Other Clinician: Referring Armstead Heiland: [name] Treating Michell Kader/Extender: [name], HOYT Weeks in Treatment: 0 Height (in): 71 Weight (lbs): 232 Body Mass Index (BMI): 32.4 Nutrition Risk Assessment Items NUTRITION RISK SCREEN: I have an illness or condition that made me change the kind and/or amount of 0 No food I eat I eat fewer than two meals per day 0 No I eat few fruits and vegetables, or milk products 0 No I have three or more drinks of beer, liquor or wine almost every day 0 No I have tooth or mouth problems that make it hard for me to eat 0 No I don't always have enough money to buy the food I need 0 No I eat alone most of the time 0 No I take three or more different prescribed or over-the-counter drugs a day 0 No Without wanting to, I have lost or gained 10 pounds in the last six months 0 No I am not always physically able to shop, cook and/or feed myself 0 No Nutrition Protocols Good Risk Protocol 0 No interventions needed Moderate Risk Protocol Electronic Signature(s) Signed: 06/24/2017 3:21:12 PM By: Roger Shelter Entered By: Roger Shelter on 06/24/2017 10:21:18

## 2017-06-26 NOTE — Progress Notes (Signed)
CADON, RACZKA ([id number]) Visit Report for 06/24/2017 Allergy List Details Patient Name: [name], [name] Date of Service: 06/24/2017 9:45 AM Medical Record Number: [id number] Patient Account Number: [id number] Date of Birth/Sex: 1945/08/22 (72 y.o. Male) Treating RN: Roger Shelter Primary Care Teiara Baria: [name] Other Clinician: Referring Wiletta Bermingham: [name] Treating Iridiana Fonner/Extender: [name], HOYT Weeks in Treatment: 0 Allergies Active Allergies tetracycline Allergy Notes Electronic Signature(s) Signed: 06/24/2017 3:21:12 PM By: Roger Shelter Entered By: Roger Shelter on 06/24/2017 10:14:58 [name] ([id number]) -------------------------------------------------------------------------------- Arrival Information Details Patient Name: [name] Date of Service: 06/24/2017 9:45 AM Medical Record Number: [id number] Patient Account Number: [id number] Date of Birth/Sex: June 02, 1945 (72 y.o. Male) Treating RN: Roger Shelter Primary Care Rissa Turley: [name] Other Clinician: Referring Lurae Hornbrook: [name] Treating Lowry Bala/Extender: Melburn Hake, HOYT Weeks in Treatment: 0 Visit Information Patient Arrived: Ambulatory Arrival Time: 10:02 Accompanied By: self Transfer Assistance: None Patient Identification Verified: Yes Secondary Verification Process Completed: Yes History Since Last Visit Electronic Signature(s) Signed: 06/24/2017 3:21:12 PM By: Roger Shelter Entered By: Roger Shelter on 06/24/2017 10:03:19 [name] ([id number]) -------------------------------------------------------------------------------- Clinic Level of Care Assessment Details Patient Name: [name] Date of Service: 06/24/2017 9:45 AM Medical Record Number: [id number] Patient Account Number: [id number] Date of Birth/Sex: 04/05/1946 (72 y.o. Male) Treating RN: Montey Hora Primary Care Refujio Haymer: [name] Other  Clinician: Referring Chloeanne Poteet: [name] Treating Lenardo Westwood/Extender: Melburn Hake, HOYT Weeks in Treatment: 0 Clinic Level of Care Assessment Items TOOL 1 Quantity Score []  - Use when EandM and Procedure is performed on INITIAL visit 0 ASSESSMENTS - Nursing Assessment / Reassessment X - General Physical Exam (combine w/ comprehensive assessment (listed just below) when 1 20 performed on new pt. evals) X- 1 25 Comprehensive Assessment (HX, ROS, Risk Assessments, Wounds Hx, etc.) ASSESSMENTS - Wound and Skin Assessment / Reassessment []  - Dermatologic / Skin Assessment (not related to wound area) 0 ASSESSMENTS - Ostomy and/or Continence Assessment and Care []  - Incontinence Assessment and Management 0 []  - 0 Ostomy Care Assessment and Management (repouching, etc.) PROCESS - Coordination of Care X - Simple Patient / Family Education for ongoing care 1 15 []  - 0 Complex (extensive) Patient / Family Education for ongoing care X- 1 10 Staff obtains Programmer, systems, Records, Test Results / Process Orders []  - 0 Staff telephones HHA, Nursing Homes / Clarify orders / etc []  - 0 Routine Transfer to another Facility (non-emergent condition) []  - 0 Routine Hospital Admission (non-emergent condition) X- 1 15 New Admissions / Biomedical engineer / Ordering NPWT, Apligraf, etc. []  - 0 Emergency Hospital Admission (emergent condition) PROCESS - Special Needs []  - Pediatric / Minor Patient Management 0 []  - 0 Isolation Patient Management []  - 0 Hearing / Language / Visual special needs []  - 0 Assessment of Community assistance (transportation, D/C planning, etc.) []  - 0 Additional assistance / Altered mentation []  - 0 Support Surface(s) Assessment (bed, cushion, seat, etc.) [name], JAIME. ([id number]) INTERVENTIONS - Miscellaneous []  - External ear exam 0 []  - 0 Patient Transfer (multiple staff / Civil Service fast streamer / Similar devices) []  - 0 Simple Staple / Suture removal (25 or  less) []  - 0 Complex Staple / Suture removal (26 or more) []  - 0 Hypo/Hyperglycemic Management (do not check if billed separately) X- 1 15 Ankle / Brachial Index (ABI) - do not check if billed separately Has the patient been seen at the hospital within the last three years: Yes Total Score: 100 Level Of Care: New/Established - Level  3 Electronic Signature(s) Signed: 06/24/2017 3:20:05 PM By: Montey Hora Entered By: Montey Hora on 06/24/2017 11:07:37 [name] ([id number]) -------------------------------------------------------------------------------- Encounter Discharge Information Details Patient Name: [name] Date of Service: 06/24/2017 9:45 AM Medical Record Number: [id number] Patient Account Number: [id number] Date of Birth/Sex: 05/29/1945 (72 y.o. Male) Treating RN: Primary Care Kyro Joswick: [name] Other Clinician: Referring Markel Kurtenbach: [name] Treating Lujuana Kapler/Extender: Melburn Hake, HOYT Weeks in Treatment: 0 Encounter Discharge Information Items Discharge Pain Level: 0 Discharge Condition: Stable Ambulatory Status: Ambulatory Discharge Destination: Home Transportation: Private Auto Accompanied By: self Schedule Follow-up Appointment: Yes Medication Reconciliation completed and No provided to Patient/Care Shadiyah Wernli: Provided on Clinical Summary of Care: 06/24/2017 Form Type Recipient Paper Patient JJ Electronic Signature(s) Signed: 06/24/2017 3:16:46 PM By: Alric Quan Entered By: Alric Quan on 06/24/2017 11:16:58 [name] ([id number]) -------------------------------------------------------------------------------- Lower Extremity Assessment Details Patient Name: [name] Date of Service: 06/24/2017 9:45 AM Medical Record Number: [id number] Patient Account Number: [id number] Date of Birth/Sex: 1945-05-16 (72 y.o. Male) Treating RN: Roger Shelter Primary Care Therese Rocco: [name] Other  Clinician: Referring Adya Wirz: [name] Treating Maquita Sandoval/Extender: [name], HOYT Weeks in Treatment: 0 Vascular Assessment Pulses: Posterior Tibial Blood Pressure: Brachial: [Left:140] [Right:160] Dorsalis Pedis: 144 [Left:Dorsalis Pedis: 150] Ankle: Posterior Tibial: 152 [Left:Posterior Tibial: 162 0.95] [Right:1.01] Toe Nail Assessment Left: Right: Thick: Yes Yes Discolored: Yes Yes Deformed: No No Improper Length and Hygiene: No No Electronic Signature(s) Signed: 06/24/2017 3:21:12 PM By: Roger Shelter Entered By: Roger Shelter on 06/24/2017 10:36:55 [name] ([id number]) -------------------------------------------------------------------------------- Multi Wound Chart Details Patient Name: [name] Date of Service: 06/24/2017 9:45 AM Medical Record Number: [id number] Patient Account Number: [id number] Date of Birth/Sex: [birth year] (72 y.o. Male) Treating RN: Montey Hora Primary Care Kendalynn Wideman: [name] Other Clinician: Referring Shaniquia Brafford: [name] Treating [name] [name]/Extender: [name], HOYT Weeks in Treatment: 0 Vital Signs Height(in): 71 Pulse(bpm): 51 Weight(lbs): 232 Blood Pressure(mmHg): 138/68 Body Mass Index(BMI): 32 Temperature(F): 98.2 Respiratory Rate 18 (breaths/min): Photos: [3:No Photos] [4:No Photos] [N/A:N/A] Wound Location: [3:Left Foot - Dorsal] [4:Right Malleolus - Lateral] [N/A:N/A] Wounding Event: [3:Gradually Appeared] [4:Gradually Appeared] [N/A:N/A] Primary Etiology: [3:Diabetic Wound/Ulcer of the Lower Extremity] [4:Diabetic Wound/Ulcer of the Lower Extremity] [N/A:N/A] Comorbid History: [3:Cataracts, Middle ear problems, Sleep Apnea, Hypertension, Type II Diabetes, Gout, Osteoarthritis, Neuropathy] [4:Cataracts, Middle ear problems, Sleep Apnea, Hypertension, Type II Diabetes, Gout, Osteoarthritis, Neuropathy]  [N/A:N/A] Date Acquired: [3:04/26/2017] [4:04/26/2017] [N/A:N/A] Weeks of  Treatment: [3:0] [4:0] [N/A:N/A] Wound Status: [3:Open] [4:Open] [N/A:N/A] Measurements L x W x D [3:0.9x0.7x0.1] [4:0.6x0.6x0.1] [N/A:N/A] (cm) Area (cm) : [3:0.495] [4:0.283] [N/A:N/A] Volume (cm) : [3:0.049] [4:0.028] [N/A:N/A] Classification: [3:Grade 3] [4:Grade 2] [N/A:N/A] Exudate Amount: [3:Medium] [4:Medium] [N/A:N/A] Exudate Type: [3:Serosanguineous] [4:Serous] [N/A:N/A] Exudate Color: [3:red, brown] [4:amber] [N/A:N/A] Wound Margin: [3:Flat and Intact] [4:Flat and Intact] [N/A:N/A] Granulation Amount: [3:Small (1-33%)] [4:None Present (0%)] [N/A:N/A] Granulation Quality: [3:Red] [4:N/A] [N/A:N/A] Necrotic Amount: [3:Large (67-100%)] [4:Large (67-100%)] [N/A:N/A] Necrotic Tissue: [3:Adherent Slough] [4:Eschar, Adherent Slough] [N/A:N/A] Exposed Structures: [3:Fat Layer (Subcutaneous Tissue) Exposed: Yes Fascia: No Tendon: No Muscle: No Joint: No Bone: No] [4:Fat Layer (Subcutaneous Tissue) Exposed: Yes Fascia: No Tendon: No Muscle: No Joint: No Bone: No] [N/A:N/A] Epithelialization: [3:None] [4:None] [N/A:N/A] Periwound Skin Texture: [3:Excoriation: No Induration: No Callus: No] [4:Excoriation: No Induration: No Callus: No] [N/A:N/A] Crepitus: No Crepitus: No Rash: No Rash: No Scarring: No Scarring: No Periwound Skin Moisture: Maceration: Yes Maceration: No N/A Dry/Scaly: No Dry/Scaly: No Periwound Skin Color: Erythema: Yes Erythema: Yes N/A Atrophie Blanche: No Atrophie Blanche: No  Cyanosis: No Cyanosis: No Ecchymosis: No Ecchymosis: No Hemosiderin Staining: No Hemosiderin Staining: No Mottled: No Mottled: No Pallor: No Pallor: No Rubor: No Rubor: No Erythema Location: Circumferential N/A N/A Tenderness on Palpation: No No N/A Wound Preparation: Ulcer Cleansing: Ulcer Cleansing: N/A Rinsed/Irrigated with Saline Rinsed/Irrigated with Saline Topical Anesthetic Applied: Topical Anesthetic Applied: Other: lidocaine 4% Other: lidocaine 4% Treatment  Notes Electronic Signature(s) Signed: 06/24/2017 3:20:05 PM By: Montey Hora Entered By: Montey Hora on 06/24/2017 11:00:12 [name] ([id number]) -------------------------------------------------------------------------------- [place] Details Patient Name: [name] Date of Service: 06/24/2017 9:45 AM Medical Record Number: [id number] Patient Account Number: [id number] Date of Birth/Sex: [birth year] (72 y.o. Male) Treating RN: Montey Hora Primary Care Delena Casebeer: [name] Other Clinician: Referring Jeet Shough: [name] Treating Shondell Fabel/Extender: Melburn Hake, HOYT Weeks in Treatment: 0 Active Inactive ` Abuse / Safety / Falls / Self Care Management Nursing Diagnoses: Potential for falls Goals: Patient will remain injury free related to falls Date Initiated: 06/24/2017 Target Resolution Date: 09/17/2017 Goal Status: Active Interventions: Assess fall risk on admission and as needed Notes: ` Orientation to the Wound Care Program Nursing Diagnoses: Knowledge deficit related to the wound healing center program Goals: Patient/caregiver will verbalize understanding of the [place] Date Initiated: 06/24/2017 Target Resolution Date: 09/17/2017 Goal Status: Active Interventions: Provide education on orientation to the wound center Notes: ` Wound/Skin Impairment Nursing Diagnoses: Impaired tissue integrity Goals: Ulcer/skin breakdown will heal within 14 weeks Date Initiated: 06/24/2017 Target Resolution Date: 09/17/2017 Goal Status: Active Interventions: GIOVONI, BUNCH ([id number]) Assess patient/caregiver ability to obtain necessary supplies Assess patient/caregiver ability to perform ulcer/skin care regimen upon admission and as needed Assess ulceration(s) every visit Notes: Electronic Signature(s) Signed: 06/24/2017 3:20:05 PM By: Montey Hora Entered By: Montey Hora on 06/24/2017 10:59:59 [name] ([id number]) -------------------------------------------------------------------------------- Pain Assessment Details Patient Name: [name] Date of Service: 06/24/2017 9:45 AM Medical Record Number: [id number] Patient Account Number: [id number] Date of Birth/Sex: [birth year] (72 y.o. Male) Treating RN: Roger Shelter Primary Care Anikka Marsan: [name] Other Clinician: Referring Ryenn Howeth: [name] Treating Makhari Dovidio/Extender: [name], HOYT Weeks in Treatment: 0 Active Problems Location of Pain Severity and Description of Pain Patient Has Paino Yes Site Locations Pain Location: Pain in Ulcers Duration of the Pain. Constant / Intermittento Constant Rate the pain. Current Pain Level: 6 Character of Pain Describe the Pain: Aching Pain Management and Medication Current Pain Management: Electronic Signature(s) Signed: 06/24/2017 3:21:12 PM By: Roger Shelter Entered By: Roger Shelter on 06/24/2017 10:04:11 [name] ([id number]) -------------------------------------------------------------------------------- Patient/Caregiver Education Details Patient Name: [name] Date of Service: 06/24/2017 9:45 AM Medical Record Number: [id number] Patient Account Number: [id number] Date of Birth/Gender: 1945-06-02 (72 y.o. Male) Treating RN: Ahmed Prima Primary Care Physician: [name] Other Clinician: Referring Physician: [name] Treating Physician/Extender: Melburn Hake, HOYT Weeks in Treatment: 0 Education Assessment Education Provided To: Patient Education Topics Provided Wound/Skin Impairment: Handouts: Caring for Your Ulcer, Other: change dressing as ordered Methods: Demonstration, Explain/Verbal Responses: State content correctly Electronic Signature(s) Signed: 06/24/2017 3:16:46 PM By: Alric Quan Entered By: Alric Quan on 06/24/2017 11:17:16 [name]  ([id number]) -------------------------------------------------------------------------------- Wound Assessment Details Patient Name: [name] Date of Service: 06/24/2017 9:45 AM Medical Record Number: [id number] Patient Account Number: [id number] Date of Birth/Sex: 30-Dec-1945 (72 y.o. Male) Treating RN: Roger Shelter Primary Care Ninetta Adelstein: [name] Other Clinician: Referring Emannuel Vise: [name] Treating Jensine Luz/Extender: [name], HOYT Weeks in Treatment: 0 Wound Status Wound Number: 3 Primary Diabetic  Wound/Ulcer of the Lower Extremity Etiology: Wound Location: Left Foot - Dorsal Wound Open Wounding Event: Gradually Appeared Status: Date Acquired: 04/26/2017 Comorbid Cataracts, Middle ear problems, Sleep Apnea, Weeks Of Treatment: 0 History: Hypertension, Type II Diabetes, Gout, Clustered Wound: No Osteoarthritis, Neuropathy Photos Photo Uploaded By: Roger Shelter on 06/24/2017 15:04:17 Wound Measurements Length: (cm) 0.9 Width: (cm) 0.7 Depth: (cm) 0.1 Area: (cm) 0.495 Volume: (cm) 0.049 % Reduction in Area: % Reduction in Volume: Epithelialization: None Tunneling: No Undermining: No Wound Description Classification: Grade 3 Wound Margin: Flat and Intact Exudate Amount: Medium Exudate Type: Serosanguineous Exudate Color: red, brown Foul Odor After Cleansing: No Slough/Fibrino Yes Wound Bed Granulation Amount: Small (1-33%) Exposed Structure Granulation Quality: Red Fascia Exposed: No Necrotic Amount: Large (67-100%) Fat Layer (Subcutaneous Tissue) Exposed: Yes Necrotic Quality: Adherent Slough Tendon Exposed: No Muscle Exposed: No Joint Exposed: No Bone Exposed: No Periwound Skin Texture [name], Dustin E. ([id number]) Texture Color No Abnormalities Noted: No No Abnormalities Noted: No Callus: No Atrophie Blanche: No Crepitus: No Cyanosis: No Excoriation: No Ecchymosis: No Induration: No Erythema: Yes Rash:  No Erythema Location: Circumferential Scarring: No Hemosiderin Staining: No Mottled: No Moisture Pallor: No No Abnormalities Noted: No Rubor: No Dry / Scaly: No Maceration: Yes Wound Preparation Ulcer Cleansing: Rinsed/Irrigated with Saline Topical Anesthetic Applied: Other: lidocaine 4%, Treatment Notes Wound #3 (Left, Dorsal Foot) 1. Cleansed with: Clean wound with Normal Saline 2. Anesthetic Topical Lidocaine 4% cream to wound bed prior to debridement 3. Peri-wound Care: Skin Prep 4. Dressing Applied: Santyl Ointment 5. Secondary Dressing Applied Bordered Foam Dressing Saline moistened guaze Electronic Signature(s) Signed: 06/24/2017 3:21:12 PM By: Roger Shelter Entered By: Roger Shelter on 06/24/2017 10:23:59 [name] ([id number]) -------------------------------------------------------------------------------- Wound Assessment Details Patient Name: [name] Date of Service: 06/24/2017 9:45 AM Medical Record Number: [id number] Patient Account Number: [id number] Date of Birth/Sex: 10/27/1945 (72 y.o. Male) Treating RN: Roger Shelter Primary Care Yarelie Hams: [name] Other Clinician: Referring Kayleigh Broadwell: [name] Treating Randell Detter/Extender: [name], HOYT Weeks in Treatment: 0 Wound Status Wound Number: 4 Primary Diabetic Wound/Ulcer of the Lower Extremity Etiology: Wound Location: Right Malleolus - Lateral Wound Open Wounding Event: Gradually Appeared Status: Date Acquired: 04/26/2017 Comorbid Cataracts, Middle ear problems, Sleep Apnea, Weeks Of Treatment: 0 History: Hypertension, Type II Diabetes, Gout, Clustered Wound: No Osteoarthritis, Neuropathy Photos Photo Uploaded By: Roger Shelter on 06/24/2017 15:04:17 Wound Measurements Length: (cm) 0.6 Width: (cm) 0.6 Depth: (cm) 0.1 Area: (cm) 0.283 Volume: (cm) 0.028 % Reduction in Area: % Reduction in Volume: Epithelialization: None Tunneling:  No Undermining: No Wound Description Classification: Grade 2 Wound Margin: Flat and Intact Exudate Amount: Medium Exudate Type: Serous Exudate Color: amber Foul Odor After Cleansing: No Slough/Fibrino No Wound Bed Granulation Amount: None Present (0%) Exposed Structure Necrotic Amount: Large (67-100%) Fascia Exposed: No Necrotic Quality: Eschar, Adherent Slough Fat Layer (Subcutaneous Tissue) Exposed: Yes Tendon Exposed: No Muscle Exposed: No Joint Exposed: No Bone Exposed: No Periwound Skin Texture [name], Dustin E. ([id number]) Texture Color No Abnormalities Noted: No No Abnormalities Noted: No Callus: No Atrophie Blanche: No Crepitus: No Cyanosis: No Excoriation: No Ecchymosis: No Induration: No Erythema: Yes Rash: No Hemosiderin Staining: No Scarring: No Mottled: No Pallor: No Moisture Rubor: No No Abnormalities Noted: No Dry / Scaly: No Maceration: No Wound Preparation Ulcer Cleansing: Rinsed/Irrigated with Saline Topical Anesthetic Applied: Other: lidocaine 4%, Treatment Notes Wound #4 (Right, Lateral Malleolus) 1. Cleansed with: Clean wound with Normal Saline 2. Anesthetic Topical Lidocaine 4% cream to wound bed prior  to debridement 3. Peri-wound Care: Skin Prep 4. Dressing Applied: Prisma Ag 5. Secondary Dressing Applied Bordered Foam Dressing Electronic Signature(s) Signed: 06/24/2017 3:21:12 PM By: Roger Shelter Entered By: Roger Shelter on 06/24/2017 10:28:51 [name] ([id number]) -------------------------------------------------------------------------------- [place] Details Patient Name: [name] Date of Service: 06/24/2017 9:45 AM Medical Record Number: [id number] Patient Account Number: [id number] Date of Birth/Sex: [birth year] (72 y.o. Male) Treating RN: Roger Shelter Primary Care Quin Mathenia: [name] Other Clinician: Referring Seanne Chirico: [name] Treating Jamarkus Lisbon/Extender: [name], HOYT Weeks  in Treatment: 0 Vital Signs Time Taken: 10:04 Temperature (F): 98.2 Height (in): 71 Pulse (bpm): 74 Source: Stated Respiratory Rate (breaths/min): 18 Weight (lbs): 232 Blood Pressure (mmHg): 138/68 Source: Measured Reference Range: 80 - 120 mg / dl Body Mass Index (BMI): 32.4 Electronic Signature(s) Signed: 06/24/2017 3:21:12 PM By: Roger Shelter Entered By: Roger Shelter on 06/24/2017 10:05:16

## 2017-06-26 NOTE — Progress Notes (Signed)
[name] ([id number]) Visit Report for 06/24/2017 Chief Complaint Document Details Patient Name: [name] Date of Service: 06/24/2017 9:45 AM Medical Record Number: [id number] Patient Account Number: [id number] Date of Birth/Sex: 1945-06-27 (72 y.o. Male) Treating RN: Primary Care Provider: [name] Other Clinician: Referring Provider: [name] Treating Provider/Extender: Melburn Hake, HOYT Weeks in Treatment: 0 Information Obtained from: Patient Chief Complaint Right lateral Malleolus ulcer and left dorsal foot ulcer Electronic Signature(s) Signed: 06/24/2017 6:13:52 PM By: Worthy Keeler [name] Entered By: Worthy Keeler on 06/24/2017 17:46:40 [name] ([id number]) -------------------------------------------------------------------------------- Debridement Details Patient Name: [name] Date of Service: 06/24/2017 9:45 AM Medical Record Number: [id number] Patient Account Number: [id number] Date of Birth/Sex: Jan 31, 1946 (72 y.o. Male) Treating RN: Montey Hora Primary Care Provider: [name] Other Clinician: Referring Provider: [name] Treating Provider/Extender: Melburn Hake, HOYT Weeks in Treatment: 0 Debridement Performed for Wound #4 Right,Lateral Malleolus Assessment: Performed By: Physician Dustin III, HOYT E., [name] Debridement: Debridement Severity of Tissue Pre Fat layer exposed Debridement: Pre-procedure Verification/Time Yes - 10:58 Out Taken: Start Time: 10:58 Pain Control: Lidocaine 4% Topical Solution Level: Skin/Subcutaneous Tissue Total Area Debrided (L x W): 0.6 (cm) x 0.6 (cm) = 0.36 (cm) Tissue and other material Viable, Non-Viable, Fibrin/Slough, Subcutaneous debrided: Instrument: Curette Bleeding: Minimum Hemostasis Achieved: Pressure End Time: 11:00 Procedural Pain: 0 Post Procedural Pain: 0 Response to Treatment: Procedure was tolerated well Post Debridement Measurements of Total  Wound Length: (cm) 0.6 Width: (cm) 0.6 Depth: (cm) 0.2 Volume: (cm) 0.057 Character of Wound/Ulcer Post Debridement: Improved Severity of Tissue Post Debridement: Fat layer exposed Post Procedure Diagnosis Same as Pre-procedure Electronic Signature(s) Signed: 06/24/2017 3:20:05 PM By: Montey Hora Signed: 06/24/2017 6:13:52 PM By: Worthy Keeler [name] Entered By: Montey Hora on 06/24/2017 11:01:06 [name] ([id number]) -------------------------------------------------------------------------------- Debridement Details Patient Name: [name] Date of Service: 06/24/2017 9:45 AM Medical Record Number: [id number] Patient Account Number: [id number] Date of Birth/Sex: 08/13/1945 (73 y.o. Male) Treating RN: Montey Hora Primary Care Provider: [name] Other Clinician: Referring Provider: [name] Treating Provider/Extender: Dustin III, HOYT Weeks in Treatment: 0 Debridement Performed for Wound #3 Left,Dorsal Foot Assessment: Performed By: Physician Dustin III, HOYT E., [name] Debridement: Debridement Severity of Tissue Pre Fat layer exposed Debridement: Pre-procedure Verification/Time Yes - 11:00 Out Taken: Start Time: 11:00 Pain Control: Lidocaine 4% Topical Solution Level: Skin/Subcutaneous Tissue Total Area Debrided (L x W): 0.9 (cm) x 0.7 (cm) = 0.63 (cm) Tissue and other material Viable, Non-Viable, Fibrin/Slough, Subcutaneous debrided: Instrument: Curette Bleeding: Minimum Hemostasis Achieved: Pressure End Time: 11:04 Procedural Pain: 0 Post Procedural Pain: 0 Response to Treatment: Procedure was tolerated well Post Debridement Measurements of Total Wound Length: (cm) 0.9 Width: (cm) 0.7 Depth: (cm) 0.2 Volume: (cm) 0.099 Character of Wound/Ulcer Post Debridement: Improved Severity of Tissue Post Debridement: Fat layer exposed Post Procedure Diagnosis Same as Pre-procedure Electronic Signature(s) Signed: 06/24/2017 3:20:05  PM By: Montey Hora Signed: 06/24/2017 6:13:52 PM By: Worthy Keeler [name] Entered By: Montey Hora on 06/24/2017 11:04:23 [name] ([id number]) -------------------------------------------------------------------------------- HPI Details Patient Name: [name] Date of Service: 06/24/2017 9:45 AM Medical Record Number: [id number] Patient Account Number: [id number] Date of Birth/Sex: 1946-02-17 (72 y.o. Male) Treating RN: Primary Care Provider: [name] Other Clinician: Referring Provider: [name] Treating Provider/Extender: Melburn Hake, HOYT Weeks in Treatment: 0 History of Present Illness HPI Description: 10/04/16 on evaluation today patient presents for initial evaluation concerning a laceration of the right wrist and hand on  the bowler aspect. He tells me that he fell into glass following a medication change in regard to his diabetes. He initially went to [place] [facility] where she was sutured and subsequently his daughter who is a Marine scientist removed the teachers at the appropriate time. Unfortunately the wound has done poorly and dehissed which has caused him some trouble including infection. He has been on antibiotics both topically and orally though the wound continues to show signs of necrotic tissue according to notes which is what he was sent to [place] for evaluation concerning. He does have diabetes and unfortunately this is severely uncontrolled. He does see an endocrinologist and is working on this. 10/11/2016 -- the patient's notes from the ER were reviewed and he had his suturing done on 09/17/2016 and had necrotic skin flaps which had to be trimmed the last visit he was here. He has managed to get his Santyl ointment and has been using this appropriately. 10/21/16 patient's wound appears to be doing very well on evaluation today. He still has some Slough covering the wound bed but this is nowhere near as severe as when i saw this initially two weeks ago nor  even my review of his pictures last week. I'm pleased with how this is progressing. Patient's wound over the right form appears to be doing fairly well on evaluation today. There is no evidence of infection and he has a small area at the crease of where she is wrist extends and flexes that is still open and appears to be having difficulty due to the fact that it is cracking open. I think this is something that may be controlled with moisture control that is keeping the wound a little bit more moist. Nonetheless this is a tiny region compared to the initial wound and overall appears to be doing very well. No fevers, chills, nausea, or vomiting noted at this time. 11/25/16 on evaluation today patient appears to be doing well in regard to his right wrist ulcer. In fact this appears to be completely healed he is having no discomfort. He is pleased with how this is progressed. Readmission: 06/24/17 on evaluation today patient appears back in our clinic regarding issues that he has been having with his right ankle on the lateral malleolus as well is in the dorsal surface of his left foot. Both have been going on for several weeks unfortunately. He tells me that initially this was being managed by Dr. Edilia Bo and I did review the notes from Dr. Edilia Bo in epic. Patient was placed on amoxicillin initially and then subsequently Keflex following. He has been using triple antibiotic ointment over-the-counter along with a Band-Aid for the wound currently. He did not have any Santyl remaining. His most recent hemoglobin A1c which was just two days ago was 10.3. Fortunately he seems to have excellent blood flow in the bilateral lower extremities with his left ABI being 0.95 in the right ABI 1.01. Patient does have some pain in regard to each of these ulcerations but he tells me it's not nearly as severe as the hand wound that I help treat earlier over the summer. This was in 2018. Electronic  Signature(s) Signed: 06/24/2017 6:13:52 PM By: Worthy Keeler [name] Entered By: Worthy Keeler on 06/24/2017 17:49:34 [name] ([id number]) -------------------------------------------------------------------------------- Physical Exam Details Patient Name: [name] Date of Service: 06/24/2017 9:45 AM Medical Record Number: [id number] Patient Account Number: [id number] Date of Birth/Sex: March 13, 1946 (72 y.o. Male) Treating RN: Primary Care Provider: Lorelei Pont,  SPENCER Other Clinician: Referring Provider: [name] Treating Provider/Extender: Dustin III, HOYT Weeks in Treatment: 0 Constitutional patient is hypertensive.. pulse regular and within target range for patient.[name] Kitchen respirations regular, non-labored and within target range for patient.[name] Kitchen temperature within target range for patient.. Well-nourished and well-hydrated in no acute distress. Eyes conjunctiva clear no eyelid edema noted. pupils equal round and reactive to light and accommodation. Ears, Nose, Mouth, and Throat no gross abnormality of ear auricles or external auditory canals. normal hearing noted during conversation. mucus membranes moist. Respiratory normal breathing without difficulty. clear to auscultation bilaterally. Cardiovascular regular rate and rhythm with normal S1, S2. no clubbing, cyanosis, significant edema, <3 sec cap refill. Gastrointestinal (GI) soft, non-tender, non-distended, +BS. no ventral hernia noted. Musculoskeletal normal gait and posture. no significant deformity or arthritic changes, no loss or range of motion, no clubbing. Psychiatric this patient is able to make decisions and demonstrates good insight into disease process. Alert and Oriented x 3. pleasant and cooperative. Notes On evaluation today patient have slough covering both wounds at this point. The debridement for the right lateral malleolus went extremely well with excellent granulation tissue noted at the base of  the wound post debridement. I think patient will likely do very well in regard to treatment at this point. Subsequently the debridement in regard to the left dorsal foot though it did go well still there was some Slough noted on the surface of the wound that was more adherent and not easily debrided away. I am going to let the Santyl work on this for the next week and then see were things stand. Patient is in agreement with this plan. Electronic Signature(s) Signed: 06/24/2017 6:13:52 PM By: Worthy Keeler [name] Entered By: Worthy Keeler on 06/24/2017 17:53:01 [name] ([id number]) -------------------------------------------------------------------------------- Physician Orders Details Patient Name: [name] Date of Service: 06/24/2017 9:45 AM Medical Record Number: [id number] Patient Account Number: [id number] Date of Birth/Sex: [birth year] (72 y.o. Male) Treating RN: Montey Hora Primary Care Provider: [name] Other Clinician: Referring Provider: [name] Treating Provider/Extender: Melburn Hake, HOYT Weeks in Treatment: 0 Verbal / Phone Orders: No Diagnosis Coding ICD-10 Coding Code Description E11.621 Type 2 diabetes mellitus with foot ulcer L97.522 Non-pressure chronic ulcer of other part of left foot with fat layer exposed I10 Essential (primary) hypertension E11.40 Type 2 diabetes mellitus with diabetic neuropathy, unspecified N18.3 Chronic kidney disease, stage 3 (moderate) Wound Cleansing Wound #3 Left,Dorsal Foot o Clean wound with Normal Saline. o May Shower, gently pat wound dry prior to applying new dressing. Wound #4 Right,Lateral Malleolus o Clean wound with Normal Saline. o May Shower, gently pat wound dry prior to applying new dressing. Anesthetic (add to Medication List) Wound #3 Left,Dorsal Foot o Topical Lidocaine 4% cream applied to wound bed prior to debridement (In Clinic Only). Wound #4 Right,Lateral Malleolus o  Topical Lidocaine 4% cream applied to wound bed prior to debridement (In Clinic Only). Primary Wound Dressing Wound #3 Left,Dorsal Foot o Santyl Ointment Wound #4 Right,Lateral Malleolus o Prisma Ag Secondary Dressing Wound #3 Left,Dorsal Foot o Dry Gauze o Boardered Foam Dressing Wound #4 Right,Lateral Malleolus o Dry Gauze o Boardered Foam Dressing Dressing Change Frequency Wound #3 Left,Dorsal Foot Ferger, Carmichaels ([id number]) o Change dressing every day. Wound #4 Right,Lateral Malleolus o Change dressing every other day. Follow-up Appointments Wound #3 Left,Dorsal Foot o Return Appointment in 1 week. Wound #4 Right,Lateral Malleolus o Return Appointment in 1 week. Additional Orders / Instructions Wound #3 Left,Dorsal Foot   o Vitamin A; Vitamin C, Zinc - Please add a multivitamin with 100% of these vitamins and minerals in it o Increase protein intake. o Activity as tolerated Wound #4 Right,Lateral Malleolus o Vitamin A; Vitamin C, Zinc - Please add a multivitamin with 100% of these vitamins and minerals in it o Increase protein intake. o Activity as tolerated Medications-please add to medication list. Wound #3 Left,Dorsal Foot o Santyl Enzymatic Ointment Wound #4 Right,Lateral Malleolus o Santyl Enzymatic Ointment Patient Medications Allergies: tetracycline Notifications Medication Indication Start End Santyl 06/24/2017 DOSE topical 250 unit/gram ointment - ointment topical apply nickel thick to the wound bed and then cover with a dressing as directed Bactrim DS 06/24/2017 DOSE 1 - oral 800 mg-160 mg tablet - 1 tablet oral taken 2 times a day for 10 days Electronic Signature(s) Signed: 06/24/2017 5:57:43 PM By: Worthy Keeler [name] Previous Signature: 06/24/2017 5:54:32 PM Version By: Worthy Keeler [name] Previous Signature: 06/24/2017 3:20:05 PM Version By: Montey Hora Entered By: Worthy Keeler on 06/24/2017 17:57:43 [name] ([id number]) -------------------------------------------------------------------------------- Problem List Details Patient Name: [name] Date of Service: 06/24/2017 9:45 AM Medical Record Number: [id number] Patient Account Number: [id number] Date of Birth/Sex: 10-18-1945 (72 y.o. Male) Treating RN: Primary Care Provider: [name] Other Clinician: Referring Provider: [name] Treating Provider/Extender: Melburn Hake, HOYT Weeks in Treatment: 0 Active Problems ICD-10 Encounter Code Description Active Date Diagnosis E11.621 Type 2 diabetes mellitus with foot ulcer 06/24/2017 Yes L97.522 Non-pressure chronic ulcer of other part of left foot with fat layer 06/24/2017 Yes exposed L97.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 with fat layer exposed 06/24/2017 Yes N18.3 Chronic kidney disease, stage 3 (moderate) 06/24/2017 Yes I10 Essential (primary) hypertension 06/24/2017 Yes E11.40 Type 2 diabetes mellitus with diabetic neuropathy, unspecified 06/24/2017 Yes Inactive Problems Resolved Problems Electronic Signature(s) Signed: 06/24/2017 6:13:52 PM By: Worthy Keeler [name] Entered By: Worthy Keeler on 06/24/2017 17:45:41 [name] ([id number]) -------------------------------------------------------------------------------- Progress Note Details Patient Name: [name] Date of Service: 06/24/2017 9:45 AM Medical Record Number: [id number] Patient Account Number: [id number] Date of Birth/Sex: 1945/12/24 (72 y.o. Male) Treating RN: Primary Care Provider: [name] Other Clinician: Referring Provider: [name] Treating Provider/Extender: Melburn Hake, HOYT Weeks in Treatment: 0 Subjective Chief Complaint Information obtained from Patient Right lateral Malleolus ulcer and left dorsal foot ulcer History of Present Illness (HPI) 10/04/16 on evaluation today patient presents for initial evaluation concerning a laceration of the right wrist  and hand on the bowler aspect. He tells me that he fell into glass following a medication change in regard to his diabetes. He initially went to [facility] [place] [facility] where she was sutured and subsequently his daughter who is a Marine scientist removed the teachers at the appropriate time. Unfortunately the wound has done poorly and dehissed which has caused him some trouble including infection. He has been on antibiotics both topically and orally though the wound continues to show signs of necrotic tissue according to notes which is what he was sent to [place] for evaluation concerning. He does have diabetes and unfortunately this is severely uncontrolled. He does see an endocrinologist and is working on this. 10/11/2016 -- the patient's notes from the ER were reviewed and he had his suturing done on 09/17/2016 and had necrotic skin flaps which had to be trimmed the last visit he was here. He has managed to get his Santyl ointment and has been using this appropriately. 10/21/16 patient's wound appears to be doing very well  on evaluation today. He still has some Slough covering the wound bed but this is nowhere near as severe as when i saw this initially two weeks ago nor even my review of his pictures last week. I'm pleased with how this is progressing. Patient's wound over the right form appears to be doing fairly well on evaluation today. There is no evidence of infection and he has a small area at the crease of where she is wrist extends and flexes that is still open and appears to be having difficulty due to the fact that it is cracking open. I think this is something that may be controlled with moisture control that is keeping the wound a little bit more moist. Nonetheless this is a tiny region compared to the initial wound and overall appears to be doing very well. No fevers, chills, nausea, or vomiting noted at this time. 11/25/16 on evaluation today patient appears to be doing well in regard to his right wrist  ulcer. In fact this appears to be completely healed he is having no discomfort. He is pleased with how this is progressed. Readmission: 06/24/17 on evaluation today patient appears back in our clinic regarding issues that he has been having with his right ankle on the lateral malleolus as well is in the dorsal surface of his left foot. Both have been going on for several weeks unfortunately. He tells me that initially this was being managed by Dr. Edilia Bo and I did review the notes from Dr. Edilia Bo in epic. Patient was placed on amoxicillin initially and then subsequently Keflex following. He has been using triple antibiotic ointment over-the-counter along with a Band-Aid for the wound currently. He did not have any Santyl remaining. His most recent hemoglobin A1c which was just two days ago was 10.3. Fortunately he seems to have excellent blood flow in the bilateral lower extremities with his left ABI being 0.95 in the right ABI 1.01. Patient does have some pain in regard to each of these ulcerations but he tells me it's not nearly as severe as the hand wound that I help treat earlier over the summer. This was in 2018. Wound History Patient presents with 2 open wounds that have been present for approximately 2 months. Patient has been treating wounds in the following manner: triple antibiotic cream. Laboratory tests have been performed in the last month. Patient reportedly has ADELBERT, GASPARD. ([id number]) not tested positive for an antibiotic resistant organism. Patient reportedly has not tested positive for osteomyelitis. Patient reportedly has not had testing performed to evaluate circulation in the legs. Patient History Information obtained from Patient. Allergies tetracycline Family History Cancer - Siblings, Diabetes - Father, Hypertension - Father, Kidney Disease - Mother, Stroke - Father, No family history of Heart Disease, Lung Disease, Thyroid Problems, Tuberculosis. Social  History Former smoker, Marital Status - Widowed, Alcohol Use - Never, Drug Use - No History, Caffeine Use - Moderate. Medical History Eyes Patient has history of Cataracts - 2018 Ear/Nose/Mouth/Throat Patient has history of Middle ear problems Denies history of Chronic sinus problems/congestion Hematologic/Lymphatic Denies history of Anemia, Hemophilia, Human Immunodeficiency Virus, Lymphedema, Sickle Cell Disease Respiratory Patient has history of Sleep Apnea Denies history of Aspiration, Asthma, Chronic Obstructive Pulmonary Disease (COPD), Pneumothorax, Tuberculosis Endocrine Patient has history of Type II Diabetes Neurologic Denies history of Dementia Patient is treated with Insulin. Blood sugar is tested. Blood sugar results noted at the following times: Bedtime - 130. 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 Eyes Complains or has symptoms of Glasses / Contacts - glasses. Denies complaints or symptoms of Dry Eyes, Vision Changes. Ear/Nose/Mouth/Throat Complains or has symptoms of Sinusitis. Denies complaints or symptoms of Difficult clearing ears. Hematologic/Lymphatic Denies complaints or symptoms of Bleeding / Clotting Disorders, Human Immunodeficiency Virus. Respiratory Denies complaints or symptoms of Chronic or frequent coughs, Shortness of Breath. Cardiovascular Denies complaints or symptoms of Chest pain, LE edema. Gastrointestinal Denies complaints or symptoms of Frequent diarrhea, Nausea, Vomiting. Endocrine Denies complaints or symptoms of Hepatitis, Thyroid disease, Polydypsia (Excessive Thirst). FAMOUS, EISENHARDT ([id number]) Genitourinary The patient has no complaints or symptoms. Immunological The patient has no complaints or  symptoms. Musculoskeletal The patient has no complaints or symptoms. Neurologic The patient has no complaints or symptoms. Oncologic The patient has no complaints or symptoms. Objective Constitutional patient is hypertensive.. pulse regular and within target range for patient.[name] Kitchen respirations regular, non-labored and within target range for patient.[name] Kitchen temperature within target range for patient.. Well-nourished and well-hydrated in no acute distress. Vitals Time Taken: 10:04 AM, Height: 71 in, Source: Stated, Weight: 232 lbs, Source: Measured, BMI: 32.4, Temperature: 98.2 F, Pulse: 74 bpm, Respiratory Rate: 18 breaths/min, Blood Pressure: 138/68 mmHg. Eyes conjunctiva clear no eyelid edema noted. pupils equal round and reactive to light and accommodation. Ears, Nose, Mouth, and Throat no gross abnormality of ear auricles or external auditory canals. normal hearing noted during conversation. mucus membranes moist. Respiratory normal breathing without difficulty. clear to auscultation bilaterally. Cardiovascular regular rate and rhythm with normal S1, S2. no clubbing, cyanosis, significant edema, Gastrointestinal (GI) soft, non-tender, non-distended, +BS. no ventral hernia noted. Musculoskeletal normal gait and posture. no significant deformity or arthritic changes, no loss or range of motion, no clubbing. Psychiatric this patient is able to make decisions and demonstrates good insight into disease process. Alert and Oriented x 3. pleasant and cooperative. General Notes: On evaluation today patient have slough covering both wounds at this point. The debridement for the right lateral malleolus went extremely well with excellent granulation tissue noted at the base of the wound post debridement. I think patient will likely do very well in regard to treatment at this point. Subsequently the debridement in regard to the left dorsal foot though it did go well still there was some Slough noted  on the surface of the wound that was more adherent and not easily debrided away. I am going to let the Santyl work on this for the next week and then see were things stand. Patient is in agreement with this plan. Integumentary (Hair, Skin) GABRYEL, FILES E. ([id number]) Wound #3 status is Open. Original cause of wound was Gradually Appeared. The wound is located on the Left,Dorsal Foot. The wound measures 0.9cm length x 0.7cm width x 0.1cm depth; 0.495cm^2 area and 0.049cm^3 volume. There is Fat Layer (Subcutaneous Tissue) Exposed exposed. There is no tunneling or undermining noted. There is a medium amount of serosanguineous drainage noted. The wound margin is flat and intact. There is small (1-33%) red granulation within the wound bed. There is a large (67-100%) amount of necrotic tissue within the wound bed including Adherent Slough. The periwound skin appearance exhibited: Maceration, Erythema. The periwound skin appearance did not exhibit: Callus, Crepitus, Excoriation, Induration, Rash, Scarring, Dry/Scaly, Atrophie Blanche, Cyanosis, Ecchymosis, Hemosiderin Staining, Mottled, Pallor, Rubor. The surrounding wound skin color is noted with erythema  which is circumferential. Wound #4 status is Open. Original cause of wound was Gradually Appeared. The wound is located on the Right,Lateral Malleolus. The wound measures 0.6cm length x 0.6cm width x 0.1cm depth; 0.283cm^2 area and 0.028cm^3 volume. There is Fat Layer (Subcutaneous Tissue) Exposed exposed. There is no tunneling or undermining noted. There is a medium amount of serous drainage noted. The wound margin is flat and intact. There is no granulation within the wound bed. There is a large (67-100%) amount of necrotic tissue within the wound bed including Eschar and Adherent Slough. The periwound skin appearance exhibited: Erythema. The periwound skin appearance did not exhibit: Callus, Crepitus, Excoriation, Induration, Rash, Scarring,  Dry/Scaly, Maceration, Atrophie Blanche, Cyanosis, Ecchymosis, Hemosiderin Staining, Mottled, Pallor, Rubor. The surrounding wound skin color is noted with erythema. 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 There was a Skin/Subcutaneous Tissue Debridement ([id number]) debridement with total area of 0.63 sq cm performed by Dustin III, HOYT E., [name]. with the following instrument(s): Curette to remove Viable and Non-Viable tissue/material including Fibrin/Slough and Subcutaneous after achieving pain control using Lidocaine 4% Topical Solution. A time out was conducted at 11:00, prior to the start of the procedure. A Minimum amount of bleeding was controlled with Pressure. The procedure was tolerated well with a pain level of 0 throughout and a pain level of 0 following the procedure. Post Debridement Measurements: 0.9cm length x 0.7cm width x 0.2cm depth; 0.099cm^3 volume. Character of Wound/Ulcer Post Debridement is improved. Severity of Tissue Post Debridement is: Fat layer exposed. Post procedure Diagnosis Wound #3: Same as Pre-Procedure Wound #4 Pre-procedure diagnosis of Wound #4 is a Diabetic Wound/Ulcer of the Lower Extremity located on the Right,Lateral Malleolus .Severity of Tissue Pre Debridement is: Fat layer exposed. There was a Skin/Subcutaneous Tissue Debridement ([id number]) debridement with total area of 0.36 sq cm  performed by Dustin III, HOYT E., [name]. with the following instrument(s): Curette to remove Viable and Non-Viable tissue/material including Fibrin/Slough and Subcutaneous after BURLIE, CAJAMARCA. ([id number]) achieving pain control using Lidocaine 4% Topical Solution. A time out was conducted at 10:58, prior to the start of the procedure. A Minimum amount of bleeding was controlled with Pressure. The procedure was tolerated well with a pain level of 0 throughout and a pain level of 0 following the procedure. Post Debridement Measurements: 0.6cm length x 0.6cm width x 0.2cm depth; 0.057cm^3 volume. Character of Wound/Ulcer Post Debridement is improved. Severity of Tissue Post Debridement is: Fat layer exposed. Post procedure Diagnosis Wound #4: Same as Pre-Procedure Plan Wound Cleansing: Wound #3 Left,Dorsal Foot: Clean wound with Normal Saline. May Shower, gently pat wound dry prior to applying new dressing. Wound #4 Right,Lateral Malleolus: Clean wound with Normal Saline. May Shower, gently pat wound dry prior to applying new dressing. Anesthetic (add to Medication List): Wound #3 Left,Dorsal Foot: Topical Lidocaine 4% cream applied to wound bed prior to debridement (In Clinic Only). Wound #4 Right,Lateral Malleolus: Topical Lidocaine 4% cream applied to wound bed prior to debridement (In Clinic  Only). Primary Wound Dressing: Wound #3 Left,Dorsal Foot: Santyl Ointment Wound #4 Right,Lateral Malleolus: Prisma Ag Secondary Dressing: Wound #3 Left,Dorsal Foot: Dry Gauze Boardered Foam Dressing Wound #4 Right,Lateral Malleolus: Dry Gauze Boardered Foam Dressing Dressing Change Frequency: Wound #3 Left,Dorsal Foot: Change dressing every day. Wound #4 Right,Lateral Malleolus: Change dressing every other day. Follow-up Appointments: Wound #3 Left,Dorsal Foot: Return Appointment in 1 week. Wound #4 Right,Lateral Malleolus: Return Appointment in 1 week. Additional Orders /  Instructions: Wound #3 Left,Dorsal Foot: Vitamin A; Vitamin C, Zinc - Please add a multivitamin with 100% of these vitamins and minerals in it Increase protein intake. Activity as tolerated Wound #4 Right,Lateral Malleolus: Vitamin A; Vitamin C, Zinc - Please add a multivitamin with 100% of these vitamins and minerals in it Increase protein intake. Activity as tolerated Medications-please add to medication list.: Wound #3 Left,Dorsal Foot: JAZON, JIPSON ([id number]) Santyl Enzymatic Ointment Wound #4 Right,Lateral Malleolus: Santyl Enzymatic Ointment The following medication(s) was prescribed: Santyl topical 250 unit/gram ointment ointment topical apply nickel thick to the wound bed and then cover with a dressing as directed starting 06/24/2017 Bactrim DS oral 800 mg-160 mg tablet 1 1 tablet oral taken 2 times a day for 10 days starting 06/24/2017 At this time I'm going to initiate treatment with the Santyl which I'm going to send into the pharmacy for the patient. He has used this before and therefore is familiar with the treatment. Subsequently we may proceed with further debridement in the future for the left dorsal foot ulcer just depending on how things progress. Patient is in agreement with plan. We will see him for fault evaluation in one weeks time to see were things stand. Otherwise it's a pleasure seeing him again today. I am also going to place the patient on Bactrim since he has not done well with the amoxicillin nor the Keflex up to this point as far as treating what appears to be infection over the left dorsal foot. He is allergic to tetracycline. There really was not enough drainage in order to perform a culture today therefore no wound culture was performed. Please see above for specific wound care orders. We will see patient for re-evaluation in 1 week(s) here in the clinic. If anything worsens or changes patient will contact our office for additional  recommendations. Electronic Signature(s) Signed: 06/24/2017 6:13:52 PM By: Worthy Keeler [name] Entered By: Worthy Keeler on 06/24/2017 17:58:39 [name] ([id number]) -------------------------------------------------------------------------------- ROS/PFSH Details Patient Name: [name] Date of Service: 06/24/2017 9:45 AM Medical Record Number: [id number] Patient Account Number: [id number] Date of Birth/Sex: 1945-09-29 (72 y.o. Male) Treating RN: Roger Shelter Primary Care Provider: [name] Other Clinician: Referring Provider: [name] Treating Provider/Extender: Melburn Hake, HOYT Weeks in Treatment: 0 Information Obtained From Patient Wound History Do you currently have one or more open woundso Yes How many open wounds do you currently haveo 2 Approximately how long have you had your woundso 2 months How have you been treating your wound(s) until nowo triple antibiotic cream Has your wound(s) ever healed and then re-openedo No Have you had any lab work done in the past montho Yes Who ordered the lab work Ambulance person at [place] Have you tested positive for an antibiotic resistant organism (MRSA, VRE)o No Have you tested positive for osteomyelitis (bone infection)o No Have you had any tests for circulation on your legso No Constitutional Symptoms (General Health) Complaints and Symptoms: Negative for: Fatigue; Fever; Chills; Marked Weight Change Eyes Complaints  and Symptoms: Positive for: Glasses / Contacts - glasses Negative for: Dry Eyes; Vision Changes Medical History: Positive for: Cataracts - 2018 Ear/Nose/Mouth/Throat Complaints and Symptoms: Positive for: Sinusitis Negative for: Difficult clearing ears Medical History: Positive for: Middle ear problems Negative for: Chronic sinus problems/congestion Hematologic/Lymphatic Complaints and Symptoms: Negative for: Bleeding / Clotting Disorders; Human Immunodeficiency  Virus Medical History: Negative for: Anemia; Hemophilia; Human Immunodeficiency Virus; Lymphedema; Sickle Cell Disease Respiratory Complaints and Symptoms: Negative for: Chronic or frequent coughs; Shortness of Breath NOUR, SCALISE ([id number]) Medical History: Positive for: Sleep Apnea Negative for: Aspiration; Asthma; Chronic Obstructive Pulmonary Disease (COPD); Pneumothorax; Tuberculosis Cardiovascular Complaints and Symptoms: Negative for: Chest pain; LE edema Medical History: Positive for: Hypertension Negative for: Angina; Arrhythmia; Congestive Heart Failure; Coronary Artery Disease; Deep Vein Thrombosis; Hypotension; Myocardial Infarction; Peripheral Arterial Disease; Peripheral Venous Disease; Phlebitis; Vasculitis Past Medical History Notes: CVA in 2015 Gastrointestinal Complaints and Symptoms: Negative for: Frequent diarrhea; Nausea; Vomiting Medical History: Negative for: Cirrhosis ; Colitis; Crohnos; Hepatitis A; Hepatitis B; Hepatitis C Endocrine Complaints and Symptoms: Negative for: Hepatitis; Thyroid disease; Polydypsia (Excessive Thirst) Medical History: Positive for: Type II Diabetes Treated with: Insulin Blood sugar tested every day: Yes Tested : TID Blood sugar testing results: Bedtime: 130 Genitourinary Complaints and Symptoms: No Complaints or Symptoms Complaints and Symptoms: Negative for: Kidney failure/ Dialysis; Incontinence/dribbling Medical History: Negative for: End Stage Renal Disease Past Medical History Notes: "weak kidneys" - is a patient at [facility] Immunological Complaints and Symptoms: No Complaints or Symptoms Medical History: Negative for: Lupus Erythematosus; Raynaudos; Scleroderma DAOUDA, LONZO ([id number]) Musculoskeletal Complaints and Symptoms: No Complaints or Symptoms Medical History: Positive for: Gout - history; Osteoarthritis Negative for: Rheumatoid Arthritis; Osteomyelitis Neurologic Complaints  and Symptoms: No Complaints or Symptoms Medical History: Positive for: Neuropathy Negative for: Dementia; Quadriplegia; Paraplegia; Seizure Disorder Oncologic Complaints and Symptoms: No Complaints or Symptoms Medical History: Negative for: Received Chemotherapy; Received Radiation HBO Extended History Items Eyes: Ear/Nose/Mouth/Throat: Cataracts Middle ear problems Immunizations Pneumococcal Vaccine: Received Pneumococcal Vaccination: Yes Immunization Notes: up to date Implantable Devices Family and Social History Cancer: Yes - Siblings; Diabetes: Yes - Father; Heart Disease: No; Hypertension: Yes - Father; Kidney Disease: Yes - Mother; Lung Disease: No; Stroke: Yes - Father; Thyroid Problems: No; Tuberculosis: No; Former smoker; Marital Status - Widowed; Alcohol Use: Never; Drug Use: No History; Caffeine Use: Moderate; Financial Concerns: No; Food, Clothing or Shelter Needs: No; Support System Lacking: No; Transportation Concerns: No; Advanced Directives: No; Patient does not want information on Advanced Directives Electronic Signature(s) Signed: 06/24/2017 3:21:12 PM By: Roger Shelter Signed: 06/24/2017 6:13:52 PM By: Worthy Keeler [name] Entered By: Roger Shelter on 06/24/2017 10:19:58 [name] ([id number]) -------------------------------------------------------------------------------- [facility] Details Patient Name: [name] Date of Service: 06/24/2017 Medical Record Number: [id number] Patient Account Number: [id number] Date of Birth/Sex: 11-03-1945 (72 y.o. Male) Treating RN: Primary Care Provider: [name] Other Clinician: Referring Provider: [name] Treating Provider/Extender: Melburn Hake, HOYT Weeks in Treatment: 0 Diagnosis Coding ICD-10 Codes Code Description E11.621 Type 2 diabetes mellitus with foot ulcer L97.522 Non-pressure chronic ulcer of other part of left foot with fat layer exposed L97.312 Non-pressure chronic ulcer of  right ankle with fat layer exposed N18.3 Chronic kidney disease, stage 3 (moderate) I10 Essential (primary) hypertension E11.40 Type 2 diabetes mellitus with diabetic neuropathy, unspecified Facility Procedures CPT4 Code: 94174081 Description: 99213 - WOUND CARE VISIT-LEV 3 EST PT Modifier: Quantity: 1 CPT4 Code: [id number] Description: 11042 - DEB SUBQ TISSUE 20 SQ CM/<  ICD-10 Diagnosis Description L97.522 Non-pressure chronic ulcer of other part of left foot with fat L97.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 with fat layer expose Modifier: layer exposed d Quantity: 1 Physician Procedures CPT4 Code: [id number] Description: 18590 - WC PHYS LEVEL 4 - EST PT ICD-10 Diagnosis Description E11.621 Type 2 diabetes mellitus with foot ulcer L97.522 Non-pressure chronic ulcer of other part of left foot with fat L97.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 with fat  layer expos N18.3 Chronic kidney disease, stage 3 (moderate) Modifier: 25 layer exposed ed Quantity: 1 CPT4 Code: [id number] Description: 24469 - WC PHYS SUBQ TISS 20 SQ CM ICD-10 Diagnosis Description L97.522 Non-pressure chronic ulcer of other part of left foot with fat L97.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 with fat layer expos Modifier: layer exposed ed Quantity: 1 Electronic Signature(s) Signed: 06/24/2017 6:13:52 PM By: Worthy Keeler [name] Entered By: Worthy Keeler on 06/24/2017 17:55:38

## 2017-06-30 ENCOUNTER — Ambulatory Visit (INDEPENDENT_AMBULATORY_CARE_PROVIDER_SITE_OTHER): Payer: Medicare Other

## 2017-06-30 DIAGNOSIS — E349 Endocrine disorder, unspecified: Secondary | ICD-10-CM | POA: Diagnosis not present

## 2017-06-30 MED ORDER — TESTOSTERONE CYPIONATE 200 MG/ML IM SOLN
150.0000 mg | Freq: Once | INTRAMUSCULAR | Status: AC
  Administered 2017-06-30: 150 mg via INTRAMUSCULAR

## 2017-07-01 ENCOUNTER — Ambulatory Visit: Payer: Medicare Other | Admitting: Physician Assistant

## 2017-07-01 ENCOUNTER — Telehealth: Payer: Self-pay | Admitting: *Deleted

## 2017-07-01 NOTE — Telephone Encounter (Signed)
Received fax from [facility] request an Rx be sent to then for ibuprofen (no dose and no instructions given), CPE scheduled for 01/02/18, please advise

## 2017-07-01 NOTE — Telephone Encounter (Signed)
Decline - he should not be taking ibuprofen or other NSAIDS with any regularity - mostly due to his kidney function

## 2017-07-08 ENCOUNTER — Encounter: Payer: Medicare Other | Admitting: Physician Assistant

## 2017-07-08 DIAGNOSIS — E11621 Type 2 diabetes mellitus with foot ulcer: Secondary | ICD-10-CM | POA: Diagnosis not present

## 2017-07-08 DIAGNOSIS — L97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 with fat layer exposed: Secondary | ICD-10-CM | POA: Diagnosis not present

## 2017-07-08 DIAGNOSIS — E1140 Type 2 diabetes mellitus with diabetic neuropathy, unspecified: Secondary | ICD-10-CM | POA: Diagnosis not present

## 2017-07-08 DIAGNOSIS — I129 Hypertensive chronic kidney disease with stage 1 through stage 4 chronic kidney disease, or unspecified chronic kidney disease: Secondary | ICD-10-CM | POA: Diagnosis not present

## 2017-07-08 DIAGNOSIS — E1122 Type 2 diabetes mellitus with diabetic chronic kidney disease: Secondary | ICD-10-CM | POA: Diagnosis not present

## 2017-07-08 DIAGNOSIS — L97522 Non-pressure chronic ulcer of other part of left foot with fat layer exposed: Secondary | ICD-10-CM | POA: Diagnosis not present

## 2017-07-10 NOTE — Progress Notes (Signed)
GOTHAM, RADEN ([id number]) Visit Report for 07/08/2017 Arrival Information Details Patient Name: [name], [name] Date of Service: 07/08/2017 2:45 PM Medical Record Number: [id number] Patient Account Number: [id number] Date of Birth/Sex: 1945/04/27 (72 y.o. M) Treating RN: Roger Shelter Primary Care Teleah Villamar: [name] Other Clinician: Referring Keymora Grillot: [name] Treating Frankye Schwegel/Extender: Melburn Hake, HOYT Weeks in Treatment: 2 Visit Information History Since Last Visit All ordered tests and consults were completed: No Patient Arrived: Ambulatory Added or deleted any medications: No Arrival Time: 14:51 Any new allergies or adverse reactions: No Accompanied By: self Had a fall or experienced change in No Transfer Assistance: None activities of daily living that may affect Patient Identification Verified: Yes risk of falls: Secondary Verification Process Completed: Yes Signs or symptoms of abuse/neglect since last visito No Hospitalized since last visit: No Implantable device outside of the clinic excluding No cellular tissue based products placed in the center since last visit: Pain Present Now: No Electronic Signature(s) Signed: 07/08/2017 4:26:13 PM By: Roger Shelter Entered By: Roger Shelter on 07/08/2017 14:51:58 [name] ([id number]) -------------------------------------------------------------------------------- Encounter Discharge Information Details Patient Name: [name] Date of Service: 07/08/2017 2:45 PM Medical Record Number: [id number] Patient Account Number: [id number] Date of Birth/Sex: 07-23-1945 (71 y.o. M) Treating RN: Montey Hora Primary Care Torrez Renfroe: [name] Other Clinician: Referring Daquan Crapps: [name] Treating Bluford Sedler/Extender: Melburn Hake, HOYT Weeks in Treatment: 2 Encounter Discharge Information Items Discharge Pain Level: 0 Discharge Condition: Stable Ambulatory Status:  Ambulatory Discharge Destination: Home Transportation: Private Auto Accompanied By: self Schedule Follow-up Appointment: Yes Medication Reconciliation completed and No provided to Patient/Care Leisel Pinette: Provided on Clinical Summary of Care: 07/08/2017 Form Type Recipient Paper Patient JJ Electronic Signature(s) Signed: 07/08/2017 4:44:36 PM By: Gretta Cool, BSN, RN, CWS, Kim RN, BSN Entered By: Gretta Cool, BSN, RN, CWS, Kim on 07/08/2017 15:57:03 [name] ([id number]) -------------------------------------------------------------------------------- Lower Extremity Assessment Details Patient Name: [name] Date of Service: 07/08/2017 2:45 PM Medical Record Number: [id number] Patient Account Number: [id number] Date of Birth/Sex: 1945/08/07 (71 y.o. M) Treating RN: Roger Shelter Primary Care Kortney Schoenfelder: [name] Other Clinician: Referring Sherma Vanmetre: [name] Treating Lekeith Wulf/Extender: Melburn Hake, HOYT Weeks in Treatment: 2 Edema Assessment Assessed: [Left: No] [Right: No] Edema: [Left: No] [Right: No] Vascular Assessment Claudication: Claudication Assessment [Left:None] [Right:None] Pulses: Dorsalis Pedis Palpable: [Left:Yes] [Right:Yes] Posterior Tibial Extremity colors, hair growth, and conditions: Extremity Color: [Left:Normal] [Right:Normal] Hair Growth on Extremity: [Left:Yes] [Right:Yes] Temperature of Extremity: [Left:Warm] [Right:Warm] Capillary Refill: [Left:< 3 seconds] [Right:< 3 seconds] Toe Nail Assessment Left: Right: Thick: Yes Yes Discolored: Yes Yes Deformed: No No Improper Length and Hygiene: Yes Yes Electronic Signature(s) Signed: 07/08/2017 4:26:13 PM By: Roger Shelter Entered By: Roger Shelter on 07/08/2017 14:59:04 [name] ([id number]) -------------------------------------------------------------------------------- Multi Wound Chart Details Patient Name: [name] Date of Service: 07/08/2017 2:45  PM Medical Record Number: [id number] Patient Account Number: [id number] Date of Birth/Sex: 1945-05-19 (71 y.o. M) Treating RN: Montey Hora Primary Care Yaseen Gilberg: [name] Other Clinician: Referring Cynitha Berte: [name] Treating Marykathleen Russi/Extender: [name], HOYT Weeks in Treatment: 2 Vital Signs Height(in): 71 Pulse(bpm): 90 Weight(lbs): 232 Blood Pressure(mmHg): 153/81 Body Mass Index(BMI): 32 Temperature(F): 98.8 Respiratory Rate 18 (breaths/min): Photos: [3:No Photos] [4:No Photos] [N/A:N/A] Wound Location: [3:Left Foot - Dorsal] [4:Right Malleolus - Lateral] [N/A:N/A] Wounding Event: [3:Gradually Appeared] [4:Gradually Appeared] [N/A:N/A] Primary Etiology: [3:Diabetic Wound/Ulcer of the Lower Extremity] [4:Diabetic Wound/Ulcer of the Lower Extremity] [N/A:N/A] Comorbid History: [3:Cataracts, Middle ear problems, Sleep Apnea, Hypertension, Type II Diabetes, Gout, Osteoarthritis, Neuropathy] [4:Cataracts,  Middle ear problems, Sleep Apnea, Hypertension, Type II Diabetes, Gout, Osteoarthritis, Neuropathy]  [N/A:N/A] Date Acquired: [3:04/26/2017] [4:04/26/2017] [N/A:N/A] Weeks of Treatment: [3:2] [4:2] [N/A:N/A] Wound Status: [3:Open] [4:Open] [N/A:N/A] Measurements L x W x D [3:0.8x0.7x0.1] [4:0.2x0.4x0.1] [N/A:N/A] (cm) Area (cm) : [3:0.44] [4:0.063] [N/A:N/A] Volume (cm) : [3:0.044] [4:0.006] [N/A:N/A] % Reduction in Area: [3:11.10%] [4:77.70%] [N/A:N/A] % Reduction in Volume: [3:10.20%] [4:78.60%] [N/A:N/A] Classification: [3:Grade 3] [4:Grade 2] [N/A:N/A] Exudate Amount: [3:Medium] [4:Small] [N/A:N/A] Exudate Type: [3:Serosanguineous] [4:Serous] [N/A:N/A] Exudate Color: [3:red, brown] [4:amber] [N/A:N/A] Wound Margin: [3:Flat and Intact] [4:Flat and Intact] [N/A:N/A] Granulation Amount: [3:Small (1-33%)] [4:None Present (0%)] [N/A:N/A] Granulation Quality: [3:Red] [4:N/A] [N/A:N/A] Necrotic Amount: [3:Large (67-100%)] [4:Large (67-100%)]  [N/A:N/A] Necrotic Tissue: [3:Adherent Slough] [4:Eschar, Adherent Slough] [N/A:N/A] Exposed Structures: [3:Fat Layer (Subcutaneous Tissue) Exposed: Yes Fascia: No Tendon: No Muscle: No Joint: No Bone: No] [4:Fat Layer (Subcutaneous Tissue) Exposed: Yes Fascia: No Tendon: No Muscle: No Joint: No Bone: No] [N/A:N/A] Epithelialization: [3:None] [4:Small (1-33%)] [N/A:N/A] Periwound Skin Texture: [N/A:N/A] Excoriation: No Excoriation: No Induration: No Induration: No Callus: No Callus: No Crepitus: No Crepitus: No Rash: No Rash: No Scarring: No Scarring: No Periwound Skin Moisture: Maceration: Yes Maceration: No N/A Dry/Scaly: No Dry/Scaly: No Periwound Skin Color: Erythema: Yes Erythema: Yes N/A Atrophie Blanche: No Atrophie Blanche: No Cyanosis: No Cyanosis: No Ecchymosis: No Ecchymosis: No Hemosiderin Staining: No Hemosiderin Staining: No Mottled: No Mottled: No Pallor: No Pallor: No Rubor: No Rubor: No Erythema Location: Circumferential N/A N/A Temperature: No Abnormality No Abnormality N/A Tenderness on Palpation: Yes Yes N/A Wound Preparation: Ulcer Cleansing: Ulcer Cleansing: N/A Rinsed/Irrigated with Saline Rinsed/Irrigated with Saline Topical Anesthetic Applied: Topical Anesthetic Applied: Other: lidocaine 4% Other: lidocaine 4% Treatment Notes Electronic Signature(s) Signed: 07/08/2017 4:53:17 PM By: Montey Hora Entered By: Montey Hora on 07/08/2017 15:42:01 [name] ([id number]) -------------------------------------------------------------------------------- [place] Details Patient Name: [name] Date of Service: 07/08/2017 2:45 PM Medical Record Number: [id number] Patient Account Number: [id number] Date of Birth/Sex: 1945/07/20 (71 y.o. M) Treating RN: Montey Hora Primary Care Zeda Gangwer: [name] Other Clinician: Referring Rubbie Goostree: [name] Treating Veeda Virgo/Extender: Melburn Hake,  HOYT Weeks in Treatment: 2 Active Inactive ` Abuse / Safety / Falls / Self Care Management Nursing Diagnoses: Potential for falls Goals: Patient will remain injury free related to falls Date Initiated: 06/24/2017 Target Resolution Date: 09/17/2017 Goal Status: Active Interventions: Assess fall risk on admission and as needed Notes: ` Orientation to the Wound Care Program Nursing Diagnoses: Knowledge deficit related to the wound healing center program Goals: Patient/caregiver will verbalize understanding of the [facility] Date Initiated: 06/24/2017 Target Resolution Date: 09/17/2017 Goal Status: Active Interventions: Provide education on orientation to the wound center Notes: ` Wound/Skin Impairment Nursing Diagnoses: Impaired tissue integrity Goals: Ulcer/skin breakdown will heal within 14 weeks Date Initiated: 06/24/2017 Target Resolution Date: 09/17/2017 Goal Status: Active Interventions: LAZARUS, SUDBURY ([id number]) Assess patient/caregiver ability to obtain necessary supplies Assess patient/caregiver ability to perform ulcer/skin care regimen upon admission and as needed Assess ulceration(s) every visit Notes: Electronic Signature(s) Signed: 07/08/2017 4:53:17 PM By: Montey Hora Entered By: Montey Hora on 07/08/2017 15:41:27 [name] ([id number]) -------------------------------------------------------------------------------- Pain Assessment Details Patient Name: [name] Date of Service: 07/08/2017 2:45 PM Medical Record Number: [id number] Patient Account Number: [id number] Date of Birth/Sex: 02/12/1946 (71 y.o. M) Treating RN: Roger Shelter Primary Care Shunta Mclaurin: [name] Other Clinician: Referring Kadarious Dikes: [name] Treating Natassia Guthridge/Extender: [name], HOYT Weeks in Treatment: 2 Active Problems Location of Pain Severity and Description of  Pain Patient Has Paino No Site Locations Pain Management and  Medication Current Pain Management: Electronic Signature(s) Signed: 07/08/2017 4:26:13 PM By: Roger Shelter Entered By: Roger Shelter on 07/08/2017 14:52:16 [name] ([id number]) -------------------------------------------------------------------------------- Patient/Caregiver Education Details Patient Name: [name] Date of Service: 07/08/2017 2:45 PM Medical Record Number: [id number] Patient Account Number: [id number] Date of Birth/Gender: Aug 17, 1945 (71 y.o. M) Treating RN: Cornell Barman Primary Care Physician: [name] Other Clinician: Referring Physician: [name] Treating Physician/Extender: Sharalyn Ink in Treatment: 2 Education Assessment Education Provided To: Patient Education Topics Provided Wound/Skin Impairment: Handouts: Caring for Your Ulcer Methods: Demonstration, Explain/Verbal Responses: State content correctly Electronic Signature(s) Signed: 07/08/2017 4:44:36 PM By: Gretta Cool, BSN, RN, CWS, Kim RN, BSN Entered By: Gretta Cool, BSN, RN, CWS, Kim on 07/08/2017 15:57:18 [name] ([id number]) -------------------------------------------------------------------------------- Wound Assessment Details Patient Name: [name] Date of Service: 07/08/2017 2:45 PM Medical Record Number: [id number] Patient Account Number: [id number] Date of Birth/Sex: Apr 16, 1945 (71 y.o. M) Treating RN: Roger Shelter Primary Care Tamarra Geiselman: [name] Other Clinician: Referring Camelia Stelzner: [name] Treating Lyndon Chenoweth/Extender: [name], HOYT Weeks in Treatment: 2 Wound Status Wound Number: 3 Primary Diabetic Wound/Ulcer of the Lower Extremity Etiology: Wound Location: Left Foot - Dorsal Wound Open Wounding Event: Gradually Appeared Status: Date Acquired: 04/26/2017 Comorbid Cataracts, Middle ear problems, Sleep Apnea, Weeks Of Treatment: 2 History: Hypertension, Type II Diabetes, Gout, Clustered Wound: No Osteoarthritis,  Neuropathy Photos Photo Uploaded By: Roger Shelter on 07/08/2017 16:08:28 Wound Measurements Length: (cm) 0.8 Width: (cm) 0.7 Depth: (cm) 0.1 Area: (cm) 0.44 Volume: (cm) 0.044 % Reduction in Area: 11.1% % Reduction in Volume: 10.2% Epithelialization: None Tunneling: No Undermining: No Wound Description Classification: Grade 3 Wound Margin: Flat and Intact Exudate Amount: Medium Exudate Type: Serosanguineous Exudate Color: red, brown Foul Odor After Cleansing: No Slough/Fibrino Yes Wound Bed Granulation Amount: Small (1-33%) Exposed Structure Granulation Quality: Red Fascia Exposed: No Necrotic Amount: Large (67-100%) Fat Layer (Subcutaneous Tissue) Exposed: Yes Necrotic Quality: Adherent Slough Tendon Exposed: No Muscle Exposed: No Joint Exposed: No Bone Exposed: No Periwound Skin Texture Hackmann, Kion E. ([id number]) Texture Color No Abnormalities Noted: No No Abnormalities Noted: No Callus: No Atrophie Blanche: No Crepitus: No Cyanosis: No Excoriation: No Ecchymosis: No Induration: No Erythema: Yes Rash: No Erythema Location: Circumferential Scarring: No Hemosiderin Staining: No Mottled: No Moisture Pallor: No No Abnormalities Noted: No Rubor: No Dry / Scaly: No Maceration: Yes Temperature / Pain Temperature: No Abnormality Tenderness on Palpation: Yes Wound Preparation Ulcer Cleansing: Rinsed/Irrigated with Saline Topical Anesthetic Applied: Other: lidocaine 4%, Treatment Notes Wound #3 (Left, Dorsal Foot) 1. Cleansed with: Clean wound with Normal Saline 2. Anesthetic Topical Lidocaine 4% cream to wound bed prior to debridement 3. Peri-wound Care: Skin Prep 4. Dressing Applied: Prisma Ag 5. Secondary Dressing Applied Bordered Foam Dressing Notes foam with circle cut out of middle to offload ankle. Electronic Signature(s) Signed: 07/08/2017 4:26:13 PM By: Roger Shelter Entered By: Roger Shelter on 07/08/2017  14:57:17 [name] ([id number]) -------------------------------------------------------------------------------- Wound Assessment Details Patient Name: [name] Date of Service: 07/08/2017 2:45 PM Medical Record Number: [id number] Patient Account Number: [id number] Date of Birth/Sex: [birth year] (71 y.o. M) Treating RN: Roger Shelter Primary Care Temeka Pore: [name] Other Clinician: Referring Kiesha Ensey: [name] Treating Burnis Halling/Extender: [name], HOYT Weeks in Treatment: 2 Wound Status Wound Number: 4 Primary Diabetic Wound/Ulcer of the Lower Extremity Etiology: Wound Location: Right Malleolus - Lateral Wound Open Wounding Event: Gradually Appeared Status: Date Acquired: 04/26/2017 Comorbid Cataracts,  Middle ear problems, Sleep Apnea, Weeks Of Treatment: 2 History: Hypertension, Type II Diabetes, Gout, Clustered Wound: No Osteoarthritis, Neuropathy Photos Photo Uploaded By: Roger Shelter on 07/08/2017 16:09:23 Wound Measurements Length: (cm) 0.2 Width: (cm) 0.4 Depth: (cm) 0.1 Area: (cm) 0.063 Volume: (cm) 0.006 % Reduction in Area: 77.7% % Reduction in Volume: 78.6% Epithelialization: Small (1-33%) Tunneling: No Undermining: No Wound Description Classification: Grade 2 Wound Margin: Flat and Intact Exudate Amount: Small Exudate Type: Serous Exudate Color: amber Foul Odor After Cleansing: No Slough/Fibrino No Wound Bed Granulation Amount: None Present (0%) Exposed Structure Necrotic Amount: Large (67-100%) Fascia Exposed: No Necrotic Quality: Eschar, Adherent Slough Fat Layer (Subcutaneous Tissue) Exposed: Yes Tendon Exposed: No Muscle Exposed: No Joint Exposed: No Bone Exposed: No Periwound Skin Texture Jaquez, Jovanni E. ([id number]) Texture Color No Abnormalities Noted: No No Abnormalities Noted: No Callus: No Atrophie Blanche: No Crepitus: No Cyanosis: No Excoriation: No Ecchymosis: No Induration:  No Erythema: Yes Rash: No Hemosiderin Staining: No Scarring: No Mottled: No Pallor: No Moisture Rubor: No No Abnormalities Noted: No Dry / Scaly: No Temperature / Pain Maceration: No Temperature: No Abnormality Tenderness on Palpation: Yes Wound Preparation Ulcer Cleansing: Rinsed/Irrigated with Saline Topical Anesthetic Applied: Other: lidocaine 4%, Treatment Notes Wound #4 (Right, Lateral Malleolus) 1. Cleansed with: Clean wound with Normal Saline 2. Anesthetic Topical Lidocaine 4% cream to wound bed prior to debridement 3. Peri-wound Care: Skin Prep 4. Dressing Applied: Prisma Ag 5. Secondary Dressing Applied Bordered Foam Dressing Notes foam with circle cut out of middle to offload ankle. Electronic Signature(s) Signed: 07/08/2017 4:26:13 PM By: Roger Shelter Entered By: Roger Shelter on 07/08/2017 14:57:45 [name] ([id number]) -------------------------------------------------------------------------------- [place] Details Patient Name: [name] Date of Service: 07/08/2017 2:45 PM Medical Record Number: [id number] Patient Account Number: [id number] Date of Birth/Sex: 29-Apr-1945 (71 y.o. M) Treating RN: Roger Shelter Primary Care Icey Tello: [name] Other Clinician: Referring Zaliah Wissner: [name] Treating Dezyrae Kensinger/Extender: Melburn Hake, HOYT Weeks in Treatment: 2 Vital Signs Time Taken: 14:52 Temperature (F): 98.8 Height (in): 71 Pulse (bpm): 90 Weight (lbs): 232 Respiratory Rate (breaths/min): 18 Body Mass Index (BMI): 32.4 Blood Pressure (mmHg): 153/81 Reference Range: 80 - 120 mg / dl Electronic Signature(s) Signed: 07/08/2017 4:26:13 PM By: Roger Shelter Entered By: Roger Shelter on 07/08/2017 14:53:05

## 2017-07-12 NOTE — Progress Notes (Signed)
AMITAI, DELAUGHTER ([id number]) Visit Report for 07/08/2017 Chief Complaint Document Details Patient Name: [name], [name] Date of Service: 07/08/2017 2:45 PM Medical Record Number: [id number] Patient Account Number: [id number] Date of Birth/Sex: 29-Aug-1945 (72 y.o. M) Treating RN: Montey Hora Primary Care Provider: [name] Other Clinician: Referring Provider: [name] Treating Provider/Extender: Melburn Hake, HOYT Weeks in Treatment: 2 Information Obtained from: Patient Chief Complaint Right lateral Malleolus ulcer and left dorsal foot ulcer Electronic Signature(s) Signed: 07/08/2017 6:18:22 PM By: Worthy Keeler [name] Entered By: Worthy Keeler on 07/08/2017 15:12:31 [name] ([id number]) -------------------------------------------------------------------------------- Debridement Details Patient Name: [name] Date of Service: 07/08/2017 2:45 PM Medical Record Number: [id number] Patient Account Number: [id number] Date of Birth/Sex: 06/22/1945 (71 y.o. M) Treating RN: Montey Hora Primary Care Provider: [name] Other Clinician: Referring Provider: [name] Treating Provider/Extender: Melburn Hake, HOYT Weeks in Treatment: 2 Debridement Performed for Wound #3 Left,Dorsal Foot Assessment: Performed By: Physician [name], HOYT E., [name] Debridement Type: Debridement Severity of Tissue Pre Fat layer exposed Debridement: Pre-procedure Verification/Time Yes - 15:42 Out Taken: Start Time: 15:42 Pain Control: Lidocaine 4% Topical Solution Total Area Debrided (L x W): 0.8 (cm) x 0.7 (cm) = 0.56 (cm) Tissue and other material Viable, Non-Viable, Slough, Subcutaneous, Fibrin/Exudate, Slough debrided: Level: Skin/Subcutaneous Tissue Debridement Description: Excisional Instrument: Curette Bleeding: Minimum Hemostasis Achieved: Pressure End Time: 15:44 Procedural Pain: 0 Post Procedural Pain: 0 Response to Treatment: Procedure  was tolerated well Post Debridement Measurements of Total Wound Length: (cm) 0.8 Width: (cm) 0.7 Depth: (cm) 0.2 Volume: (cm) 0.088 Character of Wound/Ulcer Post Debridement: Improved Severity of Tissue Post Debridement: Fat layer exposed Post Procedure Diagnosis Same as Pre-procedure Electronic Signature(s) Signed: 07/08/2017 4:53:17 PM By: Montey Hora Signed: 07/08/2017 6:18:22 PM By: Worthy Keeler [name] Entered By: Montey Hora on 07/08/2017 15:44:45 [name] ([id number]) -------------------------------------------------------------------------------- HPI Details Patient Name: [name] Date of Service: 07/08/2017 2:45 PM Medical Record Number: [id number] Patient Account Number: [id number] Date of Birth/Sex: [birth year] (71 y.o. M) Treating RN: Montey Hora Primary Care Provider: [name] Other Clinician: Referring Provider: [name] Treating Provider/Extender: Melburn Hake, HOYT Weeks in Treatment: 2 History of Present Illness HPI Description: 10/04/16 on evaluation today patient presents for initial evaluation concerning a laceration of the right wrist and hand on the bowler aspect. He tells me that he fell into glass following a medication change in regard to his diabetes. He initially went to [facility] where she was sutured and subsequently his daughter who is a Marine scientist removed the teachers at the appropriate time. Unfortunately the wound has done poorly and dehissed which has caused him some trouble including infection. He has been on antibiotics both topically and orally though the wound continues to show signs of necrotic tissue according to notes which is what he was sent to [place] for evaluation concerning. He does have diabetes and unfortunately this is severely uncontrolled. He does see an endocrinologist and is working on this. 10/11/2016 -- the patient's notes from the ER were reviewed and he had his suturing done on 09/17/2016 and had  necrotic skin flaps which had to be trimmed the last visit he was here. He has managed to get his Santyl ointment and has been using this appropriately. 10/21/16 patient's wound appears to be doing very well on evaluation today. He still has some Slough covering the wound bed but this is nowhere near as severe as when i saw this initially two weeks ago nor even  my review of his pictures last week. I'm pleased with how this is progressing. Patient's wound over the right form appears to be doing fairly well on evaluation today. There is no evidence of infection and he has a small area at the crease of where she is wrist extends and flexes that is still open and appears to be having difficulty due to the fact that it is cracking open. I think this is something that may be controlled with moisture control that is keeping the wound a little bit more moist. Nonetheless this is a tiny region compared to the initial wound and overall appears to be doing very well. No fevers, chills, nausea, or vomiting noted at this time. 11/25/16 on evaluation today patient appears to be doing well in regard to his right wrist ulcer. In fact this appears to be completely healed he is having no discomfort. He is pleased with how this is progressed. Readmission: 06/24/17 on evaluation today patient appears back in our clinic regarding issues that he has been having with his right ankle on the lateral malleolus as well is in the dorsal surface of his left foot. Both have been going on for several weeks unfortunately. He tells me that initially this was being managed by Dr. Edilia Bo and I did review the notes from Dr. Edilia Bo in epic. Patient was placed on amoxicillin initially and then subsequently Keflex following. He has been using triple antibiotic ointment over-the-counter along with a Band-Aid for the wound currently. He did not have any Santyl remaining. His most recent hemoglobin A1c which was just two days ago was  10.3. Fortunately he seems to have excellent blood flow in the bilateral lower extremities with his left ABI being 0.95 in the right ABI 1.01. Patient does have some pain in regard to each of these ulcerations but he tells me it's not nearly as severe as the hand wound that I help treat earlier over the summer. This was in 2018. 07/08/17 evaluation today patient appears to be doing much better in regard to his ulcers of the right lateral malleolus as was the left dorsal foot. He has been tolerating the dressing changes without complication. Patient states he only has discomfort really over the right lateral malleolus and otherwise the left dorsal foot is not giving him any significant trouble. Electronic Signature(s) Signed: 07/08/2017 6:18:22 PM By: Worthy Keeler [name] Entered By: Worthy Keeler on 07/08/2017 17:49:22 [name] ([id number]) -------------------------------------------------------------------------------- Physical Exam Details Patient Name: [name] Date of Service: 07/08/2017 2:45 PM Medical Record Number: [id number] Patient Account Number: [id number] Date of Birth/Sex: 10-Jun-1945 (71 y.o. M) Treating RN: Montey Hora Primary Care Provider: [name] Other Clinician: Referring Provider: [name] Treating Provider/Extender: [name], HOYT Weeks in Treatment: 2 Constitutional Well-nourished and well-hydrated in no acute distress. Respiratory normal breathing without difficulty. Psychiatric this patient is able to make decisions and demonstrates good insight into disease process. Alert and Oriented x 3. pleasant and cooperative. Notes Currently patient's wound on the dorsal foot left did require sharp debridement and post debridement he did have an excellent granular bed noted at this point in time. In regard to the right lateral malleolus ulcer this did not require debridement today although this was cleans to with saline and  gauze. Electronic Signature(s) Signed: 07/08/2017 6:18:22 PM By: Worthy Keeler [name] Entered By: Worthy Keeler on 07/08/2017 17:49:59 [name] ([id number]) -------------------------------------------------------------------------------- Physician Orders Details Patient Name: [name] Date of Service: 07/08/2017  2:45 PM Medical Record Number: [id number] Patient Account Number: [id number] Date of Birth/Sex: [birth year] (72 y.o. 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 o May Shower, gently pat wound dry prior to applying new dressing. Wound #4 Right,Lateral Malleolus o Clean wound with Normal Saline. o May Shower, gently pat wound dry prior to applying new dressing. Anesthetic (add to Medication List) Wound #3 Left,Dorsal Foot o Topical Lidocaine 4% cream applied to wound bed prior to debridement (In Clinic Only). Wound #4 Right,Lateral Malleolus o Topical Lidocaine 4% cream applied to wound bed prior to debridement (In Clinic Only). Primary Wound Dressing Wound #3 Left,Dorsal Foot o Silver Collagen Wound #4 Right,Lateral Malleolus o Silver Collagen Secondary Dressing Wound #3 Left,Dorsal Foot o Dry Gauze o Boardered Foam Dressing Wound #4 Right,Lateral Malleolus o Dry Gauze o  Boardered Foam Dressing CARRY, ORTEZ ([id number]) Dressing Change Frequency Wound #3 Left,Dorsal Foot o Change dressing every day. Wound #4 Right,Lateral Malleolus o Change dressing every other day. Follow-up Appointments Wound #3 Left,Dorsal Foot o Return Appointment in 1 week. Wound #4 Right,Lateral Malleolus o Return Appointment in 1 week. Additional Orders / Instructions Wound #3 Left,Dorsal Foot o Vitamin A; Vitamin C, Zinc - Please add a multivitamin with 100% of these vitamins and minerals in it o Increase protein intake. o Activity as tolerated Wound #4 Right,Lateral Malleolus o Vitamin A; Vitamin C, Zinc - Please add a multivitamin with 100% of these vitamins and minerals in it o Increase protein intake. o Activity as tolerated Medications-please add to medication list. Wound #3 Left,Dorsal Foot o Santyl Enzymatic Ointment Wound #4 Right,Lateral Malleolus o Santyl Enzymatic Ointment Electronic Signature(s) Signed: 07/08/2017 4:53:17 PM By: Montey Hora Signed: 07/08/2017 6:18:22 PM By: Worthy Keeler [name] Entered By: Montey Hora on 07/08/2017 15:45:24 [name] ([id number]) -------------------------------------------------------------------------------- Problem List Details Patient Name: [name] Date of Service: 07/08/2017 2:45 PM Medical Record Number: [id number] Patient Account Number: [id number] Date of Birth/Sex: Dec 15, 1945 (71 y.o. M) Treating RN: Montey Hora Primary Care Provider: [name] Other Clinician: Referring Provider: [name] Treating Provider/Extender: Melburn Hake, HOYT Weeks in Treatment: 2 Active Problems ICD-10 Impacting Encounter Code Description Active Date Wound Healing Diagnosis E11.621 Type 2 diabetes mellitus with foot ulcer 06/24/2017 Yes L97.522 Non-pressure chronic ulcer of other part of left foot with fat 06/24/2017 Yes layer exposed L97.312 Non-pressure chronic ulcer  of right ankle with fat layer 06/24/2017 Yes exposed N18.3 Chronic kidney disease, stage 3 (moderate) 06/24/2017 Yes I10 Essential (primary) hypertension 06/24/2017 Yes E11.40 Type 2 diabetes mellitus with diabetic neuropathy, 06/24/2017 Yes unspecified Inactive Problems Resolved Problems Electronic Signature(s) Signed: 07/08/2017 6:18:22 PM By: Worthy Keeler [name] Entered By: Worthy Keeler on 07/08/2017 15:12:25 [name] ([id number]) -------------------------------------------------------------------------------- Progress Note Details Patient Name: [name] Date of Service: 07/08/2017 2:45 PM Medical Record Number: [id number] Patient Account Number: [id number] Date of Birth/Sex: 1945/11/24 (71 y.o. M) Treating RN: Montey Hora Primary Care Provider: [name]  Other Clinician: Referring Provider: [name] Treating Provider/Extender: Melburn Hake, HOYT Weeks in Treatment: 2 Subjective Chief Complaint Information obtained from Patient Right lateral Malleolus ulcer and left dorsal foot ulcer History of Present Illness (HPI) 10/04/16 on evaluation today patient presents for initial evaluation concerning a laceration of the right wrist and hand on the bowler aspect. He tells me that he fell into glass following a medication change in regard to his diabetes. He initially went to [facility] where she was sutured and subsequently his daughter who is a Marine scientist removed the teachers at the appropriate time. Unfortunately the wound has done poorly and dehissed which has caused him some trouble including infection. He has been on antibiotics both topically and orally though the wound continues to show signs of necrotic tissue according to notes which is what he was sent to [place] for evaluation concerning. He does have diabetes and unfortunately this is severely uncontrolled. He does see an endocrinologist and is working on this. 10/11/2016 -- the patient's notes from the  ER were reviewed and he had his suturing done on 09/17/2016 and had necrotic skin flaps which had to be trimmed the last visit he was here. He has managed to get his Santyl ointment and has been using this appropriately. 10/21/16 patient's wound appears to be doing very well on evaluation today. He still has some Slough covering the wound bed but this is nowhere near as severe as when i saw this initially two weeks ago nor even my review of his pictures last week. I'm pleased with how this is progressing. Patient's wound over the right form appears to be doing fairly well on evaluation today. There is no evidence of infection and he has a small area at the crease of where she is wrist extends and flexes that is still open and appears to be having difficulty due to the fact that it is cracking open. I think this is something that may be controlled with moisture control that is keeping the wound a little bit more moist. Nonetheless this is a tiny region compared to the initial wound and overall appears to be doing very well. No fevers, chills, nausea, or vomiting noted at this time. 11/25/16 on evaluation today patient appears to be doing well in regard to his right wrist ulcer. In fact this appears to be completely healed he is having no discomfort. He is pleased with how this is progressed. Readmission: 06/24/17 on evaluation today patient appears back in our clinic regarding issues that he has been having with his right ankle on the lateral malleolus as well is in the dorsal surface of his left foot. Both have been going on for several weeks unfortunately. He tells me that initially this was being managed by Dr. Edilia Bo and I did review the notes from Dr. Edilia Bo in epic. Patient was placed on amoxicillin initially and then subsequently Keflex following. He has been using triple antibiotic ointment over-the-counter along with a Band-Aid for the wound currently. He did not have any Santyl remaining.  His most recent hemoglobin A1c which was just two days ago was 10.3. Fortunately he seems to have excellent blood flow in the bilateral lower extremities with his left ABI being 0.95 in the right ABI 1.01. Patient does have some pain in regard to each of these ulcerations but he tells me it's not nearly as severe as the hand wound that I help treat earlier over the summer. This was in 2018. 07/08/17 evaluation today patient  appears to be doing much better in regard to his ulcers of the right lateral malleolus as was the left dorsal foot. He has been tolerating the dressing changes without complication. Patient states he only has discomfort really over the right lateral malleolus and otherwise the left dorsal foot is not giving him any significant trouble. [name], [name] ([id number]) Patient History Information obtained from Patient. Family History Cancer - Siblings, Diabetes - Father, Hypertension - Father, Kidney Disease - Mother, Stroke - Father, No family history of Heart Disease, Lung Disease, Thyroid Problems, Tuberculosis. Social History Former smoker, Marital Status - Widowed, Alcohol Use - Never, Drug Use - No History, Caffeine Use - Moderate. Medical And Surgical History Notes Cardiovascular CVA in 2015 Genitourinary "weak kidneys" - is a patient at [facility] ([facility]) Constitutional Symptoms (General Health) Denies complaints or symptoms of Fever, Chills. Respiratory The patient has no complaints or symptoms. Cardiovascular The patient has no complaints or symptoms. Psychiatric The patient has no complaints or symptoms. Objective Constitutional Well-nourished and well-hydrated in no acute distress. Vitals Time Taken: 2:52 PM, Height: 71 in, Weight: 232 lbs, BMI: 32.4, Temperature: 98.8 F, Pulse: 90 bpm, Respiratory Rate: 18 breaths/min, Blood Pressure: 153/81 mmHg. Respiratory normal breathing without difficulty. Psychiatric this patient is able  to make decisions and demonstrates good insight into disease process. Alert and Oriented x 3. pleasant and cooperative. General Notes: Currently patient's wound on the dorsal foot left did require sharp debridement and post debridement he did have an excellent granular bed noted at this point in time. In regard to the right lateral malleolus ulcer this did not require debridement today although this was cleans to with saline and gauze. Integumentary (Hair, Skin) Wound #3 status is Open. Original cause of wound was Gradually Appeared. The wound is located on the Left,Dorsal Foot. The wound measures 0.8cm length x 0.7cm width x 0.1cm depth; 0.44cm^2 area and 0.044cm^3 volume. There is Fat [name], Dustin E. ([id number]) (Subcutaneous Tissue) Exposed exposed. There is no tunneling or undermining noted. There is a medium amount of serosanguineous drainage noted. The wound margin is flat and intact. There is small (1-33%) red granulation within the wound bed. There is a large (67-100%) amount of necrotic tissue within the wound bed including Adherent Slough. The periwound skin appearance exhibited: Maceration, Erythema. The periwound skin appearance did not exhibit: Callus, Crepitus, Excoriation, Induration, Rash, Scarring, Dry/Scaly, Atrophie Blanche, Cyanosis, Ecchymosis, Hemosiderin Staining, Mottled, Pallor, Rubor. The surrounding wound skin color is noted with erythema which is circumferential. Periwound temperature was noted as No Abnormality. The periwound has tenderness on palpation. Wound #4 status is Open. Original cause of wound was Gradually Appeared. The wound is located on the Right,Lateral Malleolus. The wound measures 0.2cm length x 0.4cm width x 0.1cm depth; 0.063cm^2 area and 0.006cm^3 volume. There is Fat Layer (Subcutaneous Tissue) Exposed exposed. There is no tunneling or undermining noted. There is a small amount of serous drainage noted. The wound margin is flat and intact.  There is no granulation within the wound bed. There is a large (67-100%) amount of necrotic tissue within the wound bed including Eschar and Adherent Slough. The periwound skin appearance exhibited: Erythema. The periwound skin appearance did not exhibit: Callus, Crepitus, Excoriation, Induration, Rash, Scarring, Dry/Scaly, Maceration, Atrophie Blanche, Cyanosis, Ecchymosis, Hemosiderin Staining, Mottled, Pallor, Rubor. The surrounding wound skin color is noted with erythema. Periwound temperature was noted as No Abnormality. The periwound has tenderness on palpation. 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 There was a Excisional Skin/Subcutaneous Tissue Debridement with a total area of 0.56 sq cm performed by [name], HOYT E., [name]. With the following instrument(s): Curette. to remove Viable and Non-Viable tissue/material Material removed includes Subcutaneous Tissue, and Slough, Fibrin/Exudate, and [place] after achieving pain control using Lidocaine 4% Topical Solution. No specimens were taken. A time out was conducted at 15:42, prior to the start of the procedure. A Minimum amount of bleeding was controlled with Pressure. The procedure was tolerated well with a pain level of 0 throughout and a pain level of 0 following the procedure. Post Debridement Measurements: 0.8cm length x 0.7cm width x 0.2cm depth; 0.088cm^3 volume. Character  of Wound/Ulcer Post Debridement is improved. Severity of Tissue Post Debridement is: Fat layer exposed. Post procedure Diagnosis Wound #3: Same as Pre-Procedure Plan [name], [name] ([id number]) Wound Cleansing: Wound #3 Left,Dorsal Foot: Clean wound with Normal Saline. May Shower, gently pat wound dry prior to applying new dressing. Wound #4 Right,Lateral Malleolus: Clean wound with Normal Saline. May Shower, gently pat wound dry prior to applying new dressing. Anesthetic (add to Medication List): Wound #3 Left,Dorsal Foot: Topical Lidocaine 4% cream applied to wound bed prior to debridement (In Clinic Only). Wound #4 Right,Lateral Malleolus: Topical Lidocaine 4% cream applied to wound bed prior to debridement (In Clinic Only). Primary Wound Dressing: Wound #3 Left,Dorsal Foot: Silver Collagen Wound #4 Right,Lateral Malleolus: Silver Collagen Secondary Dressing: Wound #3 Left,Dorsal Foot: Dry Gauze Boardered Foam Dressing Wound #4 Right,Lateral Malleolus: Dry Gauze Boardered Foam Dressing Dressing Change Frequency: Wound #3 Left,Dorsal Foot: Change dressing every day. Wound #4 Right,Lateral Malleolus: Change dressing every other day. Follow-up Appointments: Wound #3 Left,Dorsal Foot: Return Appointment in 1 week. Wound #4 Right,Lateral Malleolus: Return Appointment in 1 week. Additional Orders / Instructions: Wound #3 Left,Dorsal Foot: Vitamin A; Vitamin C, Zinc - Please add a multivitamin with 100% of these vitamins and minerals in it Increase protein intake. Activity as tolerated Wound #4 Right,Lateral Malleolus: Vitamin A; Vitamin C, Zinc - Please add a multivitamin with 100% of these vitamins and minerals in it Increase protein intake. Activity as tolerated Medications-please add to medication list.: Wound #3 Left,Dorsal Foot: Santyl Enzymatic Ointment Wound #4 Right,Lateral Malleolus: Santyl Enzymatic Ointment I am going to suggest at this point that we  switch both dressing orders to [facility] for the next week. He no longer needs to utilize the [place]. We will see were things stand at follow-up. Please see above for specific wound care orders. We will see patient for re-evaluation in 1 week(s) here in the clinic. If anything worsens or changes patient will contact our office for additional recommendations. [name], [name] ([id number]) Electronic Signature(s) Signed: 07/08/2017 6:18:22 PM By: Worthy Keeler [name] Entered By: Worthy Keeler on 07/08/2017 17:50:23 [name], [name] ([id number]) -------------------------------------------------------------------------------- ROS/PFSH Details Patient Name: [name] Date of Service: 07/08/2017 2:45 PM Medical Record Number: [id number] Patient Account Number: [id number] Date of Birth/Sex: 1946/01/09 (71 y.o. M) Treating RN: Montey Hora Primary  Care Provider: [name] Other Clinician: Referring Provider: [name] Treating Provider/Extender: Melburn Hake, HOYT Weeks in Treatment: 2 Information Obtained From Patient Wound History Do you currently have one or more open woundso Yes How many open wounds do you currently haveo 2 Approximately how long have you had your woundso 2 months How have you been treating your wound(s) until nowo triple antibiotic cream Has your wound(s) ever healed and then re-openedo No Have you had any lab work done in the past montho Yes Who ordered the lab work Ambulance person at [facility] Have you tested positive for an antibiotic resistant organism (MRSA, VRE)o No Have you tested positive for osteomyelitis (bone infection)o No Have you had any tests for circulation on your legso No Constitutional Symptoms (General Health) Complaints and Symptoms: Negative for: Fever; Chills Eyes Medical History: Positive for: Cataracts - 2018 Ear/Nose/Mouth/Throat Medical History: Positive for: Middle ear problems Negative for: Chronic sinus  problems/congestion Hematologic/Lymphatic Medical History: Negative for: Anemia; Hemophilia; Human Immunodeficiency Virus; Lymphedema; Sickle Cell Disease Respiratory Complaints and Symptoms: No Complaints or Symptoms Medical History: Positive for: Sleep Apnea Negative for: Aspiration; Asthma; Chronic Obstructive Pulmonary Disease (COPD); Pneumothorax; Tuberculosis Cardiovascular Complaints and Symptoms: No Complaints or Symptoms Medical History: [name], [name] ([id number]) Positive for: Hypertension Negative for: Angina; Arrhythmia; Congestive Heart Failure; Coronary Artery Disease; Deep Vein Thrombosis; Hypotension; Myocardial Infarction; Peripheral Arterial Disease; Peripheral Venous Disease; Phlebitis; Vasculitis Past Medical History Notes: CVA in 2015 Gastrointestinal Medical History: Negative for: Cirrhosis ; Colitis; Crohnos; Hepatitis A; Hepatitis B; Hepatitis C Endocrine Medical History: Positive for: Type II Diabetes Treated with: Insulin Blood sugar tested every day: Yes Tested : TID Blood sugar testing results: Bedtime: 130 Genitourinary Medical History: Negative for: End Stage Renal Disease Past Medical History Notes: "weak kidneys" - is a patient at [facility] History: Negative for: Lupus Erythematosus; Raynaudos; Scleroderma Musculoskeletal Medical History: Positive for: Gout - history; Osteoarthritis Negative for: Rheumatoid Arthritis; Osteomyelitis Neurologic Medical History: Positive for: Neuropathy Negative for: Dementia; Quadriplegia; Paraplegia; Seizure Disorder Oncologic Medical History: Negative for: Received Chemotherapy; Received Radiation Psychiatric Complaints and Symptoms: No Complaints or Symptoms HBO Extended History Items Eyes: Ear/Nose/Mouth/Throat: Cataracts Middle ear problems [name], [name] ([id number]) Immunizations Pneumococcal Vaccine: Received Pneumococcal Vaccination: Yes Immunization  Notes: up to date Implantable Devices Family and Social History Cancer: Yes - Siblings; Diabetes: Yes - Father; Heart Disease: No; Hypertension: Yes - Father; Kidney Disease: Yes - Mother; Lung Disease: No; Stroke: Yes - Father; Thyroid Problems: No; Tuberculosis: No; Former smoker; Marital Status - Widowed; Alcohol Use: Never; Drug Use: No History; Caffeine Use: Moderate; Financial Concerns: No; Food, Clothing or Shelter Needs: No; Support System Lacking: No; Transportation Concerns: No; Advanced Directives: No; Patient does not want information on Advanced Directives Physician Affirmation I have reviewed and agree with the above information. Electronic Signature(s) Signed: 07/08/2017 6:18:22 PM By: Worthy Keeler [name] Signed: 07/11/2017 5:08:18 PM By: Montey Hora Entered By: Worthy Keeler on 07/08/2017 17:49:43 [name] ([id number]) -------------------------------------------------------------------------------- SuperBill Details Patient Name: [name] Date of Service: 07/08/2017 Medical Record Number: [id number] Patient Account Number: [id number] Date of Birth/Sex: 12-04-1945 (71 y.o. 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 N18.3 Chronic kidney disease, stage 3 (moderate) I10 Essential (primary) hypertension E11.40 Type 2 diabetes mellitus with diabetic neuropathy, unspecified Facility Procedures CPT4 Code: 88828003 Description: 49179 - DEB SUBQ TISSUE 20 SQ CM/< ICD-10 Diagnosis Description L97.522 Non-pressure chronic ulcer of other part of left foot with  fat Modifier: layer exposed Quantity: 1 Physician Procedures CPT4 Code: [id number] Description: 94801 - WC PHYS SUBQ TISS 20 SQ CM ICD-10 Diagnosis Description L97.522 Non-pressure chronic ulcer of other part of left foot with fat Modifier: layer exposed Quantity: 1 Electronic Signature(s) Signed: 07/08/2017 6:18:22 PM By: Worthy Keeler [name] Entered By: Worthy Keeler on 07/08/2017 17:50:35

## 2017-07-14 ENCOUNTER — Ambulatory Visit (INDEPENDENT_AMBULATORY_CARE_PROVIDER_SITE_OTHER): Payer: Medicare Other

## 2017-07-14 DIAGNOSIS — E349 Endocrine disorder, unspecified: Secondary | ICD-10-CM | POA: Diagnosis not present

## 2017-07-14 MED ORDER — TESTOSTERONE CYPIONATE 200 MG/ML IM SOLN
150.0000 mg | Freq: Once | INTRAMUSCULAR | Status: AC
  Administered 2017-07-14: 150 mg via INTRAMUSCULAR

## 2017-07-15 ENCOUNTER — Ambulatory Visit
Admission: RE | Admit: 2017-07-15 | Discharge: 2017-07-15 | Disposition: A | Discharge status: Home / Self Care | Payer: Medicare Other | Source: Ambulatory Visit | Attending: Physician Assistant | Admitting: Physician Assistant

## 2017-07-15 ENCOUNTER — Other Ambulatory Visit: Payer: Self-pay | Admitting: Physician Assistant

## 2017-07-15 ENCOUNTER — Encounter: Payer: Medicare Other | Attending: Physician Assistant | Admitting: Physician Assistant

## 2017-07-15 DIAGNOSIS — S91301A Unspecified open wound, right foot, initial encounter: Secondary | ICD-10-CM | POA: Diagnosis not present

## 2017-07-15 DIAGNOSIS — E11621 Type 2 diabetes mellitus with foot ulcer: Secondary | ICD-10-CM | POA: Insufficient documentation

## 2017-07-15 DIAGNOSIS — N183 Chronic kidney disease, stage 3 (moderate): Secondary | ICD-10-CM | POA: Insufficient documentation

## 2017-07-15 DIAGNOSIS — M7989 Other specified soft tissue disorders: Secondary | ICD-10-CM | POA: Insufficient documentation

## 2017-07-15 DIAGNOSIS — L03031 Cellulitis of right toe: Secondary | ICD-10-CM | POA: Insufficient documentation

## 2017-07-15 DIAGNOSIS — E1140 Type 2 diabetes mellitus with diabetic neuropathy, unspecified: Secondary | ICD-10-CM | POA: Insufficient documentation

## 2017-07-15 DIAGNOSIS — Z87891 Personal history of nicotine dependence: Secondary | ICD-10-CM | POA: Diagnosis not present

## 2017-07-15 DIAGNOSIS — E1122 Type 2 diabetes mellitus with diabetic chronic kidney disease: Secondary | ICD-10-CM | POA: Insufficient documentation

## 2017-07-15 DIAGNOSIS — Z8673 Personal history of transient ischemic attack (TIA), and cerebral infarction without residual deficits: Secondary | ICD-10-CM | POA: Insufficient documentation

## 2017-07-15 DIAGNOSIS — S81802A Unspecified open wound, left lower leg, initial encounter: Secondary | ICD-10-CM

## 2017-07-15 DIAGNOSIS — L97522 Non-pressure chronic ulcer of other part of left foot with fat layer exposed: Secondary | ICD-10-CM | POA: Insufficient documentation

## 2017-07-15 DIAGNOSIS — I129 Hypertensive chronic kidney disease with stage 1 through stage 4 chronic kidney disease, or unspecified chronic kidney disease: Secondary | ICD-10-CM | POA: Diagnosis not present

## 2017-07-15 DIAGNOSIS — L97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 with fat layer exposed: Secondary | ICD-10-CM | POA: Diagnosis not present

## 2017-07-15 DIAGNOSIS — E11622 Type 2 diabetes mellitus with other skin ulcer: Secondary | ICD-10-CM | POA: Diagnosis not present

## 2017-07-15 DIAGNOSIS — Z794 Long term (current) use of insulin: Secondary | ICD-10-CM | POA: Insufficient documentation

## 2017-07-15 DIAGNOSIS — G4730 Sleep apnea, unspecified: Secondary | ICD-10-CM | POA: Diagnosis not present

## 2017-07-15 DIAGNOSIS — S91001A Unspecified open wound, right ankle, initial encounter: Secondary | ICD-10-CM | POA: Diagnosis present

## 2017-07-15 DIAGNOSIS — S81801A Unspecified open wound, right lower leg, initial encounter: Secondary | ICD-10-CM

## 2017-07-19 NOTE — Progress Notes (Signed)
KUNTA, HILLEARY ([id number]) Visit Report for 07/15/2017 Chief Complaint Document Details Patient Name: [name], [name] Date of Service: 07/15/2017 1:45 PM Medical Record Number: [id number] Patient Account Number: [id number] Date of Birth/Sex: [birth year] (72 y.o. M) Treating RN: Montey Hora Primary Care Provider: [name] Other Clinician: Referring Provider: [name] Treating Provider/Extender: Melburn Hake, Kimila Papaleo Weeks in Treatment: 3 Information Obtained from: Patient Chief Complaint Right lateral Malleolus ulcer and left dorsal foot ulcer Electronic Signature(s) Signed: 07/15/2017 5:57:55 PM By: Worthy Keeler [name] Entered By: Worthy Keeler on 07/15/2017 14:24:07 [name] ([id number]) -------------------------------------------------------------------------------- Debridement Details Patient Name: [name] Date of Service: 07/15/2017 1:45 PM Medical Record Number: [id number] Patient Account Number: [id number] Date of Birth/Sex: 1945-09-27 (71 y.o. M) Treating RN: Montey Hora Primary Care Provider: [name] Other Clinician: Referring Provider: [name] Treating Provider/Extender: Melburn Hake, Analilia Geddis Weeks in Treatment: 3 Debridement Performed for Wound #4 Right,Lateral Malleolus Assessment: Performed By: Physician [name], Virginio Isidore E., [name] Debridement Type: Debridement Severity of Tissue Pre Fat layer exposed Debridement: Pre-procedure Verification/Time Yes - 14:54 Out Taken: Start Time: 14:54 Pain Control: Lidocaine 4% Topical Solution Total Area Debrided (L x W): 0.3 (cm) x 0.3 (cm) = 0.09 (cm) Tissue and other material Viable, Non-Viable, Slough, Subcutaneous, Fibrin/Exudate, Slough debrided: Level: Skin/Subcutaneous Tissue Debridement Description: Excisional Instrument: Curette Bleeding: Minimum Hemostasis Achieved: Pressure End Time: 14:55 Procedural Pain: 0 Post Procedural Pain: 0 Response to Treatment: Procedure  was tolerated well Post Debridement Measurements of Total Wound Length: (cm) 0.3 Width: (cm) 0.3 Depth: (cm) 0.2 Volume: (cm) 0.014 Character of Wound/Ulcer Post Debridement: Improved Severity of Tissue Post Debridement: Fat layer exposed Post Procedure Diagnosis Same as Pre-procedure Electronic Signature(s) Signed: 07/15/2017 3:34:53 PM By: Montey Hora Signed: 07/15/2017 5:57:55 PM By: Worthy Keeler [name] Entered By: Montey Hora on 07/15/2017 14:56:47 [name] ([id number]) -------------------------------------------------------------------------------- Debridement Details Patient Name: [name] Date of Service: 07/15/2017 1:45 PM Medical Record Number: [id number] Patient Account Number: [id number] Date of Birth/Sex: [birth year] (71 y.o. M) Treating RN: Montey Hora Primary Care Provider: [name] Other Clinician: Referring Provider: [name] Treating Provider/Extender: Melburn Hake, Tiyon Sanor Weeks in Treatment: 3 Debridement Performed for Wound #3 Left,Dorsal Foot Assessment: Performed By: Physician [name], Aryeh Butterfield E., [name] Debridement Type: Debridement Severity of Tissue Pre Fat layer exposed Debridement: Pre-procedure Verification/Time Yes - 14:55 Out Taken: Start Time: 14:55 Pain Control: Lidocaine 4% Topical Solution Total Area Debrided (L x W): 0.5 (cm) x 0.4 (cm) = 0.2 (cm) Tissue and other material Viable, Non-Viable, Slough, Subcutaneous, Fibrin/Exudate, Slough debrided: Level: Skin/Subcutaneous Tissue Debridement Description: Excisional Instrument: Curette Bleeding: Minimum Hemostasis Achieved: Pressure End Time: 14:56 Procedural Pain: 0 Post Procedural Pain: 0 Response to Treatment: Procedure was tolerated well Post Debridement Measurements of Total Wound Length: (cm) 0.5 Width: (cm) 0.4 Depth: (cm) 0.2 Volume: (cm) 0.031 Character of Wound/Ulcer Post Debridement: Improved Severity of Tissue Post Debridement: Fat  layer exposed Post Procedure Diagnosis Same as Pre-procedure Electronic Signature(s) Signed: 07/15/2017 3:34:53 PM By: Montey Hora Signed: 07/15/2017 5:57:55 PM By: Worthy Keeler [name] Entered By: Montey Hora on 07/15/2017 14:57:25 [name] ([id number]) -------------------------------------------------------------------------------- HPI Details Patient Name: [name] Date of Service: 07/15/2017 1:45 PM Medical Record Number: [id number] Patient Account Number: [id number] Date of Birth/Sex: 1945/07/25 (71 y.o. M) Treating RN: Montey Hora Primary Care Provider: [name] Other Clinician: Referring Provider: [name] Treating Provider/Extender: Melburn Hake, Timmie Dugue Weeks in Treatment: 3 History of Present Illness HPI Description: 10/04/16 on evaluation  today patient presents for initial evaluation concerning a laceration of the right wrist and hand on the bowler aspect. He tells me that he fell into glass following a medication change in regard to his diabetes. He initially went to [facility] ER where she was sutured and subsequently his daughter who is a Marine scientist removed the teachers at the appropriate time. Unfortunately the wound has done poorly and dehissed which has caused him some trouble including infection. He has been on antibiotics both topically and orally though the wound continues to show signs of necrotic tissue according to notes which is what he was sent to [place] for evaluation concerning. He does have diabetes and unfortunately this is severely uncontrolled. He does see an endocrinologist and is working on this. 10/11/2016 -- the patient's notes from the ER were reviewed and he had his suturing done on 09/17/2016 and had necrotic skin flaps which had to be trimmed the last visit he was here. He has managed to get his Santyl ointment and has been using this appropriately. 10/21/16 patient's wound appears to be doing very well on evaluation today. He  still has some Slough covering the wound bed but this is nowhere near as severe as when i saw this initially two weeks ago nor even my review of his pictures last week. I'm pleased with how this is progressing. Patient's wound over the right form appears to be doing fairly well on evaluation today. There is no evidence of infection and he has a small area at the crease of where she is wrist extends and flexes that is still open and appears to be having difficulty due to the fact that it is cracking open. I think this is something that may be controlled with moisture control that is keeping the wound a little bit more moist. Nonetheless this is a tiny region compared to the initial wound and overall appears to be doing very well. No fevers, chills, nausea, or vomiting noted at this time. 11/25/16 on evaluation today patient appears to be doing well in regard to his right wrist ulcer. In fact this appears to be completely healed he is having no discomfort. He is pleased with how this is progressed. Readmission: 06/24/17 on evaluation today patient appears back in our clinic regarding issues that he has been having with his right ankle on the lateral malleolus as well is in the dorsal surface of his left foot. Both have been going on for several weeks unfortunately. He tells me that initially this was being managed by Dr. Edilia Bo and I did review the notes from Dr. Edilia Bo in epic. Patient was placed on amoxicillin initially and then subsequently Keflex following. He has been using triple antibiotic ointment over-the-counter along with a Band-Aid for the wound currently. He did not have any Santyl remaining. His most recent hemoglobin A1c which was just two days ago was 10.3. Fortunately he seems to have excellent blood flow in the bilateral lower extremities with his left ABI being 0.95 in the right ABI 1.01. Patient does have some pain in regard to each of these ulcerations but he tells me it's not  nearly as severe as the hand wound that I help treat earlier over the summer. This was in 2018. 07/15/17 on evaluation today patient's lower surety ulcer is actually both appear to be doing fairly well at this point. He does have a little bit of skin overlapping the ulcer on the right lateral ankle this was debrided away today just with  saline and gauze without complication patient had no discomfort or pain. Otherwise patient's left foot ulcer does appear to be showing signs of improvement he does seem to have a right great toe. Nikki and noted at this point in time. I think that this does need to be managed at this point. No fevers, chills, nausea, or vomiting noted at this time. Electronic Signature(s) Signed: 07/15/2017 5:57:55 PM By: Worthy Keeler [name] Entered By: Worthy Keeler on 07/15/2017 17:35:11 STEPHANIE, MCGLONE ([id number], VANDERLOOP ([id number]) -------------------------------------------------------------------------------- Physical Exam Details Patient Name: [name] Date of Service: 07/15/2017 1:45 PM Medical Record Number: [id number] Patient Account Number: [id number] Date of Birth/Sex: 1945-12-23 (71 y.o. M) Treating RN: Montey Hora Primary Care Provider: [name] Other Clinician: Referring Provider: [name] Treating Provider/Extender: [name], Jafari Mckillop Weeks in Treatment: 3 Constitutional Well-nourished and well-hydrated in no acute distress. Respiratory normal breathing without difficulty. Psychiatric this patient is able to make decisions and demonstrates good insight into disease process. Alert and Oriented x 3. pleasant and cooperative. Notes Patient has been tolerating everything well as far as the dressings are concerned I previously did have him on Bactrim which he tolerated very well back at that point. That was for a unrelated issue to what is currently going on. Nonetheless I think this may be a good choice for him in regard to  the right great toe paronychia which is noted today. Patient tolerated sharp debridement of the wounds with good result post debridement both wound beds appear to be doing much better. Electronic Signature(s) Signed: 07/15/2017 5:57:55 PM By: Worthy Keeler [name] Entered By: Worthy Keeler on 07/15/2017 17:36:09 [name] ([id number]) -------------------------------------------------------------------------------- Physician Orders Details Patient Name: [name] Date of Service: 07/15/2017 1:45 PM Medical Record Number: [id number] Patient Account Number: [id number] Date of Birth/Sex: 1945-08-18 (71 y.o. 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 o May Shower, gently pat wound dry prior to applying new dressing. Wound #4 Right,Lateral Malleolus o Clean wound with Normal Saline. o May Shower, gently pat wound dry prior to applying new dressing. Anesthetic (add to Medication List) Wound #3 Left,Dorsal Foot o Topical Lidocaine 4% cream applied to wound bed prior to debridement (In Clinic Only). Wound #4 Right,Lateral Malleolus o Topical Lidocaine 4% cream applied to wound bed prior to debridement (In Clinic Only). Primary Wound Dressing Wound #3 Left,Dorsal Foot o Silver Collagen Wound #4 Right,Lateral  Malleolus o Silver Collagen Secondary Dressing Wound #3 Left,Dorsal Foot o Dry Gauze o Boardered Foam Dressing Wound #4 Right,Lateral Malleolus o Dry Gauze o Boardered Foam Dressing JAKEEL, STARLIPER ([id number]) Dressing Change Frequency Wound #3 Left,Dorsal Foot o Change dressing every day. Wound #4 Right,Lateral Malleolus o Change dressing every other day. Follow-up Appointments Wound #3 Left,Dorsal Foot o Return Appointment in 1 week. Wound #4 Right,Lateral Malleolus o Return Appointment in 1 week. Additional Orders / Instructions Wound #3 Left,Dorsal Foot o Vitamin A; Vitamin C,  Zinc - Please add a multivitamin with 100% of these vitamins and minerals in it o Increase protein intake. o Activity as tolerated Wound #4 Right,Lateral Malleolus o Vitamin A; Vitamin C, Zinc - Please add a multivitamin with 100% of these vitamins and minerals in it o Increase protein intake. o Activity as tolerated Patient Medications Allergies: tetracycline Notifications Medication Indication Start End Bactrim DS 07/15/2017 DOSE 1 - oral 800 mg-160 mg tablet - 1 tablet oral taken 2 times a day for 10 days Electronic Signature(s) Signed: 07/15/2017 5:39:19 PM By: Worthy Keeler [name] Previous Signature: 07/15/2017 3:34:53 PM Version By: Montey Hora Entered By: Worthy Keeler on 07/15/2017 17:39:18 [name] ([id number]) -------------------------------------------------------------------------------- Problem List Details Patient Name: [name] Date of Service: 07/15/2017 1:45 PM Medical Record Number: [id number] Patient Account Number: [id number] Date of Birth/Sex: [birth year] (71 y.o. 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 with fat layer 06/24/2017 Yes exposed L03.031 Cellulitis of right toe 07/15/2017 Yes N18.3 Chronic kidney disease, stage 3 (moderate) 06/24/2017 Yes I10 Essential (primary) hypertension 06/24/2017 Yes E11.40 Type 2 diabetes mellitus with diabetic neuropathy, 06/24/2017 Yes unspecified Inactive Problems Resolved Problems Electronic Signature(s) Signed: 07/15/2017 5:57:55 PM By: Worthy Keeler [name] Entered By: Worthy Keeler on 07/15/2017 17:37:21 [name] ([id number]) -------------------------------------------------------------------------------- Progress Note Details Patient Name: [name] Date of Service: 07/15/2017 1:45 PM Medical Record Number: [id number] Patient Account Number: [id number] Date of Birth/Sex: January 08, 1946 (71 y.o. M) Treating RN: Montey Hora Primary Care Provider: [name] Other Clinician: Referring Provider: [name] Treating Provider/Extender: Melburn Hake, Milcah Dulany Weeks in Treatment: 3 Subjective Chief Complaint Information obtained from Patient Right lateral Malleolus ulcer and left dorsal foot ulcer History of Present Illness (HPI) 10/04/16 on evaluation today patient presents for initial evaluation concerning a laceration of the right wrist and hand on the bowler aspect. He tells me that he fell into glass following a medication change in regard to his diabetes. He initially went to [facility] ER where she was sutured and subsequently his daughter who is a Marine scientist removed the teachers at the appropriate time. Unfortunately the wound has done poorly and dehissed which has caused him some trouble including infection. He has been on antibiotics both topically and orally though the wound continues to show  signs of necrotic tissue according to notes which is what he was sent to [place] for evaluation concerning. He does have diabetes and unfortunately this is severely uncontrolled. He does see an endocrinologist and is working on this. 10/11/2016 -- the patient's notes from the ER were reviewed and he had his suturing done on 09/17/2016 and had necrotic skin flaps which had to be trimmed the last visit he was here. He has managed to get his Santyl ointment and has been using this appropriately. 10/21/16 patient's wound appears to be doing very well on evaluation today. He still has some Slough covering the wound bed but this is nowhere near as severe as when i saw this initially two weeks ago nor even my review of his pictures last week. I'm pleased with how this is progressing. Patient's wound over the  right form appears to be doing fairly well on evaluation today. There is no evidence of infection and he has a small area at the crease of where she is wrist extends and flexes that is still open and appears to be having difficulty due to the fact that it is cracking open. I think this is something that may be controlled with moisture control that is keeping the wound a little bit more moist. Nonetheless this is a tiny region compared to the initial wound and overall appears to be doing very well. No fevers, chills, nausea, or vomiting noted at this time. 11/25/16 on evaluation today patient appears to be doing well in regard to his right wrist ulcer. In fact this appears to be completely healed he is having no discomfort. He is pleased with how this is progressed. Readmission: 06/24/17 on evaluation today patient appears back in our clinic regarding issues that he has been having with his right ankle on the lateral malleolus as well is in the dorsal surface of his left foot. Both have been going on for several weeks unfortunately. He tells me that initially this was being managed by Dr. Edilia Bo and I did  review the notes from Dr. Edilia Bo in epic. Patient was placed on amoxicillin initially and then subsequently Keflex following. He has been using triple antibiotic ointment over-the-counter along with a Band-Aid for the wound currently. He did not have any Santyl remaining. His most recent hemoglobin A1c which was just two days ago was 10.3. Fortunately he seems to have excellent blood flow in the bilateral lower extremities with his left ABI being 0.95 in the right ABI 1.01. Patient does have some pain in regard to each of these ulcerations but he tells me it's not nearly as severe as the hand wound that I help treat earlier over the summer. This was in 2018. 07/15/17 on evaluation today patient's lower surety ulcer is actually both appear to be doing fairly well at this point. He does have a little bit of skin overlapping the ulcer on the right lateral ankle this was debrided away today just with saline and gauze without complication patient had no discomfort or pain. Otherwise patient's left foot ulcer does appear to be showing signs of improvement he does seem to have a right great toe. Nikki and noted at this point in time. I think that this does need to be DONTARIUS, SHELEY. ([id number]) managed at this point. No fevers, chills, nausea, or vomiting noted at this time. Patient History Information obtained from Patient. Family History Cancer - Siblings, Diabetes - Father, Hypertension - Father, Kidney Disease - Mother, Stroke - Father, No family history of Heart Disease, Lung Disease, Thyroid Problems, Tuberculosis. Social History Former smoker, Marital Status - Widowed, Alcohol Use - Never, Drug Use - No History, Caffeine Use - Moderate. Medical And Surgical History Notes Cardiovascular CVA in 2015 Genitourinary "weak kidneys" - is a patient at [facility] ([facility]) Constitutional Symptoms (General Health) Denies complaints or symptoms of Fever, Chills. Respiratory The  patient has no complaints or symptoms. Cardiovascular The patient has no complaints or symptoms. Psychiatric The patient has no complaints or symptoms. Objective Constitutional Well-nourished and well-hydrated in no acute distress. Vitals Time Taken: 1:57 PM, Height: 71 in, Weight: 232 lbs, BMI: 32.4, Temperature: 98.0 F, Pulse: 115 bpm, Respiratory Rate: 18 breaths/min, Blood Pressure: 183/86 mmHg. Respiratory normal breathing without difficulty. Psychiatric this patient is able to make decisions and demonstrates good insight into disease  process. Alert and Oriented x 3. pleasant and cooperative. General Notes: Patient has been tolerating everything well as far as the dressings are concerned I previously did have him on Bactrim which he tolerated very well back at that point. That was for a unrelated issue to what is currently going on. Nonetheless I think this may be a good choice for him in regard to the right great toe paronychia which is noted today. Patient tolerated sharp debridement of the wounds with good result post debridement both wound beds appear to be doing much better. CAROL, LOFTIN ([id number]) Integumentary (Hair, Skin) Wound #3 status is Open. Original cause of wound was Gradually Appeared. The wound is located on the Left,Dorsal Foot. The wound measures 0.5cm length x 0.4cm width x 0.1cm depth; 0.157cm^2 area and 0.016cm^3 volume. There is Fat Layer (Subcutaneous Tissue) Exposed exposed. There is no tunneling or undermining noted. There is a medium amount of serosanguineous drainage noted. The wound margin is flat and intact. There is medium (34-66%) red granulation within the wound bed. There is a medium (34-66%) amount of necrotic tissue within the wound bed including Adherent Slough. The periwound skin appearance exhibited: Maceration, Erythema. The periwound skin appearance did not exhibit: Callus, Crepitus, Excoriation, Induration, Rash, Scarring, Dry/Scaly,  Atrophie Blanche, Cyanosis, Ecchymosis, Hemosiderin Staining, Mottled, Pallor, Rubor. The surrounding wound skin color is noted with erythema which is circumferential. Periwound temperature was noted as No Abnormality. The periwound has tenderness on palpation. Wound #4 status is Open. Original cause of wound was Gradually Appeared. The wound is located on the Right,Lateral Malleolus. The wound measures 0.3cm length x 0.3cm width x 0.1cm depth; 0.071cm^2 area and 0.007cm^3 volume. There is Fat Layer (Subcutaneous Tissue) Exposed exposed. There is no tunneling or undermining noted. There is a small amount of serous drainage noted. The wound margin is flat and intact. There is no granulation within the wound bed. There is a large (67-100%) amount of necrotic tissue within the wound bed including Eschar and Adherent Slough. The periwound skin appearance exhibited: Erythema. The periwound skin appearance did not exhibit: Callus, Crepitus, Excoriation, Induration, Rash, Scarring, Dry/Scaly, Maceration, Atrophie Blanche, Cyanosis, Ecchymosis, Hemosiderin Staining, Mottled, Pallor, Rubor. The surrounding wound skin color is noted with erythema. Periwound temperature was noted as No Abnormality. The periwound has tenderness on palpation. Assessment Active Problems ICD-10 E11.621 - Type 2 diabetes mellitus with foot ulcer L97.522 - Non-pressure chronic ulcer of other part of left foot with fat layer exposed L97.312 - Non-pressure chronic ulcer of right ankle with fat layer exposed L03.031 - Cellulitis of right toe N18.3 - Chronic kidney disease, stage 3 (moderate) I10 - Essential (primary) hypertension E11.40 - Type 2 diabetes mellitus with diabetic neuropathy, unspecified Procedures Wound #3 Pre-procedure diagnosis of Wound #3 is a Diabetic Wound/Ulcer of the Lower Extremity located on the Left,Dorsal Foot .Severity of Tissue Pre Debridement is: Fat layer exposed. There was a Excisional  Skin/Subcutaneous Tissue Debridement with a total area of 0.2 sq cm performed by [name], Tamicka Shimon E., [name]. With the following instrument(s): Curette. to remove Viable and Non-Viable tissue/material Material removed includes Subcutaneous Tissue, and Slough, Fibrin/Exudate, and [place] after achieving pain control using Lidocaine 4% Topical Solution. No specimens were taken. A time out was conducted at 14:55, prior to the start of the procedure. A Minimum amount of bleeding was controlled with Pressure. The procedure was tolerated well with a pain level of 0 throughout and a pain level of 0 following the procedure. Post Debridement Measurements:  0.5cm length x 0.4cm width x 0.2cm depth; 0.031cm^3 volume. Character of Wound/Ulcer Post Debridement is improved. Severity of Tissue Post Debridement is: Fat layer exposed. 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 There was a Excisional Skin/Subcutaneous Tissue Debridement with a total area of 0.09 sq cm performed by [name], Gurshaan Matsuoka E., [name]. With the following instrument(s): Curette. to remove Viable and Non-Viable tissue/material Material removed includes Subcutaneous Tissue, and Slough, Fibrin/Exudate, and Downing after achieving pain control using Lidocaine 4% Topical Solution. No specimens were taken. A time out was conducted at 14:54, prior to the start of the procedure. A Minimum amount of bleeding was controlled with Pressure. The procedure was tolerated well with a pain level of 0 throughout and a pain level of 0 following the procedure. Post Debridement Measurements: 0.3cm length x 0.3cm width x 0.2cm depth; 0.014cm^3 volume. Character of Wound/Ulcer Post Debridement is improved. Severity of Tissue Post Debridement  is: Fat layer exposed. Post procedure Diagnosis Wound #4: Same as Pre-Procedure Plan Wound Cleansing: Wound #3 Left,Dorsal Foot: Clean wound with Normal Saline. May Shower, gently pat wound dry prior to applying new dressing. Wound #4 Right,Lateral Malleolus: Clean wound with Normal Saline. May Shower, gently pat wound dry prior to applying new dressing. Anesthetic (add to Medication List): Wound #3 Left,Dorsal Foot: Topical Lidocaine 4% cream applied to wound bed prior to debridement (In Clinic Only). Wound #4 Right,Lateral Malleolus: Topical Lidocaine 4% cream applied to wound bed prior to debridement (In Clinic Only). Primary Wound Dressing: Wound #3 Left,Dorsal Foot: Silver Collagen Wound #4 Right,Lateral Malleolus: Silver Collagen Secondary Dressing: Wound #3 Left,Dorsal Foot: Dry Gauze Boardered Foam Dressing Wound #4 Right,Lateral Malleolus: Dry Gauze Boardered Foam Dressing Dressing Change Frequency: Wound #3 Left,Dorsal Foot: Change dressing every day. Wound #4 Right,Lateral Malleolus: Change dressing every other day. Follow-up Appointments: Wound #3 Left,Dorsal Foot: Return Appointment in 1 week. Wound #4 Right,Lateral Malleolus: Return Appointment in 1 week. Additional Orders / Instructions: Wound #3 Left,Dorsal Foot: Vitamin A; Vitamin C, Zinc - Please add a multivitamin with 100% of these vitamins and minerals in it Increase protein intake. Activity as tolerated Wound #4 Right,Lateral Malleolus: Vitamin A; Vitamin C, Zinc - Please add a multivitamin with 100% of these vitamins and minerals in it [place], Clayvon E. ([id number]) Increase protein intake. Activity as tolerated The following medication(s) was prescribed: Bactrim DS oral 800 mg-160 mg tablet 1 1 tablet oral taken 2 times a day for 10 days starting 07/15/2017 I am going to suggest that we continue with the Current wound care measures for the next week patient is in agreement with plan. We will  subsequently see were things stand following. Please see above for specific wound care orders. We will see patient for re-evaluation in 1 week(s) here in the clinic. If anything worsens or changes patient will contact our office for additional recommendations. Electronic Signature(s) Signed: 07/15/2017 5:57:55 PM By: Worthy Keeler [name] Entered By: Worthy Keeler on 07/15/2017 17:39:33 [name] ([id number]) -------------------------------------------------------------------------------- ROS/PFSH Details Patient Name: [name] Date of Service: 07/15/2017 1:45 PM Medical Record Number: [id number] Patient Account Number: [id number] Date of Birth/Sex: 10/18/1945 (71 y.o. M) Treating RN: Montey Hora Primary Care Provider: [name] Other Clinician: Referring Provider: [name] Treating Provider/Extender: Melburn Hake, Varun Jourdan Weeks in Treatment: 3 Information  Obtained From Patient Wound History Do you currently have one or more open woundso Yes How many open wounds do you currently haveo 2 Approximately how long have you had your woundso 2 months How have you been treating your wound(s) until nowo triple antibiotic cream Has your wound(s) ever healed and then re-openedo No Have you had any lab work done in the past montho Yes Who ordered the lab work Ambulance person at [facility] Have you tested positive for an antibiotic resistant organism (MRSA, VRE)o No Have you tested positive for osteomyelitis (bone infection)o No Have you had any tests for circulation on your legso No Constitutional Symptoms (General Health) Complaints and Symptoms: Negative for: Fever; Chills Eyes Medical History: Positive for: Cataracts - 2018 Ear/Nose/Mouth/Throat Medical History: Positive for: Middle ear problems Negative for: Chronic sinus problems/congestion Hematologic/Lymphatic Medical History: Negative for: Anemia; Hemophilia; Human Immunodeficiency Virus; Lymphedema;  Sickle Cell Disease Respiratory Complaints and Symptoms: No Complaints or Symptoms Medical History: Positive for: Sleep Apnea Negative for: Aspiration; Asthma; Chronic Obstructive Pulmonary Disease (COPD); Pneumothorax; Tuberculosis Cardiovascular Complaints and Symptoms: No Complaints or Symptoms Medical History: ERICE, AHLES ([id number]) Positive for: Hypertension Negative for: Angina; Arrhythmia; Congestive Heart Failure; Coronary Artery Disease; Deep Vein Thrombosis; Hypotension; Myocardial Infarction; Peripheral Arterial Disease; Peripheral Venous Disease; Phlebitis; Vasculitis Past Medical History Notes: CVA in 2015 Gastrointestinal Medical History: Negative for: Cirrhosis ; Colitis; Crohnos; Hepatitis A; Hepatitis B; Hepatitis C Endocrine Medical History: Positive for: Type II Diabetes Treated with: Insulin Blood sugar tested every day: Yes Tested : TID Blood sugar testing results: Bedtime: 130 Genitourinary Medical History: Negative for: End Stage Renal Disease Past Medical History Notes: "weak kidneys" - is a patient at [facility] History: Negative for: Lupus Erythematosus; Raynaudos; Scleroderma Musculoskeletal Medical History: Positive for: Gout - history; Osteoarthritis Negative for: Rheumatoid Arthritis; Osteomyelitis Neurologic Medical History: Positive for: Neuropathy Negative for: Dementia; Quadriplegia; Paraplegia; Seizure Disorder Oncologic Medical History: Negative for: Received Chemotherapy; Received Radiation Psychiatric Complaints and Symptoms: No Complaints or Symptoms HBO Extended History Items Eyes: Ear/Nose/Mouth/Throat: Cataracts Middle ear problems REECE, FEHNEL ([id number]) Immunizations Pneumococcal Vaccine: Received Pneumococcal Vaccination: Yes Immunization Notes: up to date Implantable Devices Family and Social History Cancer: Yes - Siblings; Diabetes: Yes - Father; Heart Disease: No;  Hypertension: Yes - Father; Kidney Disease: Yes - Mother; Lung Disease: No; Stroke: Yes - Father; Thyroid Problems: No; Tuberculosis: No; Former smoker; Marital Status - Widowed; Alcohol Use: Never; Drug Use: No History; Caffeine Use: Moderate; Financial Concerns: No; Food, Clothing or Shelter Needs: No; Support System Lacking: No; Transportation Concerns: No; Advanced Directives: No; Patient does not want information on Advanced Directives Physician Affirmation I have reviewed and agree with the above information. Electronic Signature(s) Signed: 07/15/2017 5:57:55 PM By: Worthy Keeler [name] Signed: 07/18/2017 4:12:39 PM By: Montey Hora Entered By: Worthy Keeler on 07/15/2017 17:35:43 [name] ([id number]) -------------------------------------------------------------------------------- SuperBill Details Patient Name: [name] Date of Service: 07/15/2017 Medical Record Number: [id number] Patient Account Number: [id number] Date of Birth/Sex: 04-02-1946 (71 y.o. 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 L03.031 Cellulitis of right toe N18.3 Chronic kidney disease, stage 3 (moderate) I10 Essential (primary) hypertension  E11.40 Type 2 diabetes mellitus with diabetic neuropathy, unspecified Facility Procedures CPT4 Code: 59563875 Description: 64332 - DEB SUBQ TISSUE 20 SQ CM/< ICD-10 Diagnosis Description L97.522 Non-pressure chronic ulcer of other part of left foot with fat L97.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 with fat layer expos Modifier: layer exposed  ed Quantity: 1 Physician Procedures CPT4 Code: 9518841 Description: 66063 - WC PHYS LEVEL 4 - EST PT ICD-10 Diagnosis Description E11.621 Type 2 diabetes mellitus with foot ulcer 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

## 2017-07-19 NOTE — Progress Notes (Signed)
[name], [name] ([id number]) Visit Report for 07/15/2017 Arrival Information Details Patient Name: [name], [name] Date of Service: 07/15/2017 1:45 PM Medical Record Number: [id number] Patient Account Number: [id number] Date of Birth/Sex: [birth year] (72 y.o. M) Treating RN: Roger Shelter Primary Care [name]: [name] Other Clinician: Referring [name]: [name] Treating [name]/Extender: [name], [name] [name]: 3 Visit Information History Since Last Visit All ordered tests and consults were completed: No Patient Arrived: Ambulatory Added or deleted any medications: No Arrival Time: 13:55 Any new allergies or adverse reactions: No Accompanied By: self Had a fall or experienced change in No Transfer Assistance: None activities of daily living that may affect Patient Identification Verified: Yes risk of falls: Secondary Verification Process Completed: Yes Signs or symptoms of abuse/neglect since last visito No Hospitalized since last visit: No Implantable device outside of the clinic excluding No cellular tissue based products placed in the center since last visit: Pain Present Now: No Electronic Signature(s) Signed: 07/15/2017 4:14:16 PM By: Roger Shelter Entered By: Roger Shelter on 07/15/2017 13:55:58 [name] ([id number]) -------------------------------------------------------------------------------- Encounter Discharge Information Details Patient Name: [name] Date of Service: 07/15/2017 1:45 PM Medical Record Number: [id number] Patient Account Number: [id number] Date of Birth/Sex: 01-03-1946 (71 y.o. M) Treating RN: Montey Hora Primary Care Navia Lindahl: [name] Other Clinician: Referring [name]: [name] Treating Jahrel Borthwick/Extender: [name], [name] [name]: 3 Encounter Discharge Information Items Discharge Pain Level: 0 Discharge Condition: Stable Ambulatory Status:  Ambulatory Discharge Destination: Home Transportation: Private Auto Accompanied By: self Schedule Follow-up Appointment: Yes Medication Reconciliation completed and No provided to Patient/Care [name]: Provided on Clinical Summary of Care: 07/15/2017 Form Type Recipient Paper Patient [name] Electronic Signature(s) Signed: 07/18/2017 4:32:12 PM By: [name] Entered By: [name] on 07/15/2017 15:10:04 [name] ([id number]) -------------------------------------------------------------------------------- Lower Extremity Assessment Details Patient Name: [name] Date of Service: 07/15/2017 1:45 PM Medical Record Number: [id number] Patient Account Number: [id number] Date of Birth/Sex: Nov 04, 1945 (71 y.o. M) Treating RN: Roger Shelter Primary Care Hiroyuki Ozanich: [name] Other Clinician: Referring Gladstone Rosas: [name] Treating Larita Deremer/Extender: [name], [name] [name]: 3 Edema Assessment Assessed: [Left: No] [Right: No] Edema: [Left: No] [Right: No] Vascular Assessment Claudication: Claudication Assessment [Left:None] [Right:None] Pulses: Dorsalis Pedis Palpable: [Left:Yes] [Right:Yes] Posterior Tibial Extremity colors, hair growth, and conditions: Extremity Color: [Left:Normal] [Right:Normal] Hair Growth on Extremity: [Left:Yes] [Right:Yes] Temperature of Extremity: [Left:Warm] [Right:Warm] Capillary Refill: [Left:< 3 seconds] [Right:< 3 seconds] Toe Nail Assessment Left: Right: Thick: Yes Yes Discolored: Yes Yes Deformed: No No Improper Length and Hygiene: Yes Yes Electronic Signature(s) Signed: 07/15/2017 4:14:16 PM By: Roger Shelter Entered By: Roger Shelter on 07/15/2017 14:04:11 [name] ([id number]) -------------------------------------------------------------------------------- Multi Wound Chart Details Patient Name: [name] Date of Service: 07/15/2017 1:45 PM Medical Record Number:  [id number] Patient Account Number: [id number] Date of Birth/Sex: [birth year] (71 y.o. M) Treating RN: Montey Hora Primary Care Adisynn Suleiman: [name] Other Clinician: Referring Suresh Audi: [name] Treating Avalie Oconnor/Extender: [name], [name] [name]: 3 Vital Signs Height(in): 71 Pulse(bpm): 115 Weight(lbs): 232 Blood Pressure(mmHg): 183/86 Body Mass Index(BMI): 32 Temperature(F): 98.0 Respiratory Rate 18 (breaths/min): Photos: [3:No Photos] [4:No Photos] [N/A:N/A] Wound Location: [3:Left Foot - Dorsal] [4:Right Malleolus - Lateral] [N/A:N/A] Wounding Event: [3:Gradually Appeared] [4:Gradually Appeared] [N/A:N/A] Primary Etiology: [3:Diabetic Wound/Ulcer of the Lower Extremity] [4:Diabetic Wound/Ulcer of the Lower Extremity] [N/A:N/A] Comorbid History: [3:Cataracts, Middle ear problems, Sleep Apnea, Hypertension, Type II Diabetes, Gout, Osteoarthritis, Neuropathy] [4:Cataracts, Middle ear problems, Sleep Apnea, Hypertension, Type II  Diabetes, Gout, Osteoarthritis, Neuropathy]  [N/A:N/A] Date Acquired: [3:04/26/2017] [4:04/26/2017] [N/A:N/A] Dustin of [name]: [3:3] [4:3] [N/A:N/A] Wound Status: [3:Open] [4:Open] [N/A:N/A] Measurements L x W x D [3:0.5x0.4x0.1] [4:0.3x0.3x0.1] [N/A:N/A] (cm) Area (cm) : [3:0.157] [4:0.071] [N/A:N/A] Volume (cm) : [3:0.016] [4:0.007] [N/A:N/A] % Reduction in Area: [3:68.30%] [4:74.90%] [N/A:N/A] % Reduction in Volume: [3:67.30%] [4:75.00%] [N/A:N/A] Classification: [3:Grade 3] [4:Grade 2] [N/A:N/A] Exudate Amount: [3:Medium] [4:Small] [N/A:N/A] Exudate Type: [3:Serosanguineous] [4:Serous] [N/A:N/A] Exudate Color: [3:red, brown] [4:amber] [N/A:N/A] Wound Margin: [3:Flat and Intact] [4:Flat and Intact] [N/A:N/A] Granulation Amount: [3:Medium (34-66%)] [4:None Present (0%)] [N/A:N/A] Granulation Quality: [3:Red] [4:N/A] [N/A:N/A] Necrotic Amount: [3:Medium (34-66%)] [4:Large (67-100%)] [N/A:N/A] Necrotic Tissue: [3:Adherent  Slough] [4:Eschar, Adherent Slough] [N/A:N/A] Exposed Structures: [3:Fat Layer (Subcutaneous Tissue) Exposed: Yes Fascia: No Tendon: No Muscle: No Joint: No Bone: No] [4:Fat Layer (Subcutaneous Tissue) Exposed: Yes Fascia: No Tendon: No Muscle: No Joint: No Bone: No] [N/A:N/A] Epithelialization: [3:None] [4:Small (1-33%)] [N/A:N/A] Periwound Skin Texture: [N/A:N/A] Excoriation: No Excoriation: No Induration: No Induration: No Callus: No Callus: No Crepitus: No Crepitus: No Rash: No Rash: No Scarring: No Scarring: No Periwound Skin Moisture: Maceration: Yes Maceration: No N/A Dry/Scaly: No Dry/Scaly: No Periwound Skin Color: Erythema: Yes Erythema: Yes N/A Atrophie Blanche: No Atrophie Blanche: No Cyanosis: No Cyanosis: No Ecchymosis: No Ecchymosis: No Hemosiderin Staining: No Hemosiderin Staining: No Mottled: No Mottled: No Pallor: No Pallor: No Rubor: No Rubor: No Erythema Location: Circumferential N/A N/A Temperature: No Abnormality No Abnormality N/A Tenderness on Palpation: Yes Yes N/A Wound Preparation: Ulcer Cleansing: Ulcer Cleansing: N/A Rinsed/Irrigated with Saline Rinsed/Irrigated with Saline Topical Anesthetic Applied: Topical Anesthetic Applied: Other: lidocaine 4% Other: lidocaine 4% [name] Notes Electronic Signature(s) Signed: 07/15/2017 3:34:53 PM By: Montey Hora Entered By: Montey Hora on 07/15/2017 14:50:54 [name] ([id number]) -------------------------------------------------------------------------------- Multi-Disciplinary Care Plan Details Patient Name: [name] Date of Service: 07/15/2017 1:45 PM Medical Record Number: [id number] Patient Account Number: [id number] Date of Birth/Sex: 05-Apr-1946 (71 y.o. M) Treating RN: Montey Hora Primary Care Tineshia Becraft: [name] Other Clinician: Referring Kelaiah Escalona: [name] Treating Istvan Behar/Extender: [name], [name] [name]: 3 Active  Inactive ` Abuse / Safety / Falls / Self Care Management Nursing Diagnoses: Potential for falls Goals: Patient will remain injury free related to falls Date Initiated: 06/24/2017 Target Resolution Date: 09/17/2017 Goal Status: Active Interventions: Assess fall risk on admission and as needed Notes: ` Orientation to the Wound Care Program Nursing Diagnoses: Knowledge deficit related to the wound healing center program Goals: Patient/caregiver will verbalize understanding of the [facility] Date Initiated: 06/24/2017 Target Resolution Date: 09/17/2017 Goal Status: Active Interventions: Provide education on orientation to the wound center Notes: ` Wound/Skin Impairment Nursing Diagnoses: Impaired tissue integrity Goals: Ulcer/skin breakdown will heal within 14 Dustin Date Initiated: 06/24/2017 Target Resolution Date: 09/17/2017 Goal Status: Active Interventions: READE, TREFZ ([id number]) Assess patient/caregiver ability to obtain necessary supplies Assess patient/caregiver ability to perform ulcer/skin care regimen upon admission and as needed Assess ulceration(s) every visit Notes: Electronic Signature(s) Signed: 07/15/2017 3:34:53 PM By: Montey Hora Entered By: Montey Hora on 07/15/2017 14:50:47 [name] ([id number]) -------------------------------------------------------------------------------- Pain Assessment Details Patient Name: [name] Date of Service: 07/15/2017 1:45 PM Medical Record Number: [id number] Patient Account Number: [id number] Date of Birth/Sex: September 26, 1945 (71 y.o. M) Treating RN: Roger Shelter Primary Care Trinitee Horgan: [name] Other Clinician: Referring Ceri Mayer: [name] Treating Lakeishia Truluck/Extender: [name], [name] [name]: 3 Active Problems Location of Pain Severity and Description of Pain Patient Has Paino No Site Locations Pain  Management and Medication Current Pain  Management: Electronic Signature(s) Signed: 07/15/2017 4:14:16 PM By: Roger Shelter Entered By: Roger Shelter on 07/15/2017 13:57:10 [name] ([id number]) -------------------------------------------------------------------------------- Patient/Caregiver Education Details Patient Name: [name] Date of Service: 07/15/2017 1:45 PM Medical Record Number: [id number] Patient Account Number: [id number] Date of Birth/Gender: 1945-04-19 (71 y.o. M) Treating RN: Ahmed Prima Primary Care Physician: [name] Other Clinician: Referring Physician: Owens [name] Treating Physician/Extender: Sharalyn Ink in [name]: 3 Education Assessment Education Provided To: Patient Education Topics Provided Wound/Skin Impairment: Handouts: Caring for Your Ulcer, Other: change dressing as ordered Methods: Demonstration, Explain/Verbal Responses: State content correctly Electronic Signature(s) Signed: 07/18/2017 4:32:12 PM By: [name] Entered By: [name] on 07/15/2017 15:10:19 [name] ([id number]) -------------------------------------------------------------------------------- Wound Assessment Details Patient Name: [name] Date of Service: 07/15/2017 1:45 PM Medical Record Number: [id number] Patient Account Number: [id number] Date of Birth/Sex: March 03, 1946 (71 y.o. M) Treating RN: Roger Shelter Primary Care Adahlia Stembridge: [name] Other Clinician: Referring Neamiah Sciarra: [name] Treating Makaylah Oddo/Extender: [name], [name] [name]: 3 Wound Status Wound Number: 3 Primary Diabetic Wound/Ulcer of the Lower Extremity Etiology: Wound Location: Left Foot - Dorsal Wound Open Wounding Event: Gradually Appeared Status: Date Acquired: 04/26/2017 Comorbid Cataracts, Middle ear problems, Sleep Apnea, Dustin Of [name]: 3 History: Hypertension, Type II Diabetes, Gout, Clustered Wound: No Osteoarthritis,  Neuropathy Photos Photo Uploaded By: Roger Shelter on 07/15/2017 16:05:07 Wound Measurements Length: (cm) 0.5 Width: (cm) 0.4 Depth: (cm) 0.1 Area: (cm) 0.157 Volume: (cm) 0.016 % Reduction in Area: 68.3% % Reduction in Volume: 67.3% Epithelialization: None Tunneling: No Undermining: No Wound Description Classification: Grade 3 Wound Margin: Flat and Intact Exudate Amount: Medium Exudate Type: Serosanguineous Exudate Color: red, brown Foul Odor After Cleansing: No Slough/Fibrino Yes Wound Bed Granulation Amount: Medium (34-66%) Exposed Structure Granulation Quality: Red Fascia Exposed: No Necrotic Amount: Medium (34-66%) Fat Layer (Subcutaneous Tissue) Exposed: Yes Necrotic Quality: Adherent Slough Tendon Exposed: No Muscle Exposed: No Joint Exposed: No Bone Exposed: No Periwound Skin Texture Ryles, Onnie E. ([id number]) Texture Color No Abnormalities Noted: No No Abnormalities Noted: No Callus: No Atrophie Blanche: No Crepitus: No Cyanosis: No Excoriation: No Ecchymosis: No Induration: No Erythema: Yes Rash: No Erythema Location: Circumferential Scarring: No Hemosiderin Staining: No Mottled: No Moisture Pallor: No No Abnormalities Noted: No Rubor: No Dry / Scaly: No Maceration: Yes Temperature / Pain Temperature: No Abnormality Tenderness on Palpation: Yes Wound Preparation Ulcer Cleansing: Rinsed/Irrigated with Saline Topical Anesthetic Applied: Other: lidocaine 4%, [name] Notes Wound #3 (Left, Dorsal Foot) 1. Cleansed with: Clean wound with Normal Saline 2. Anesthetic Topical Lidocaine 4% cream to wound bed prior to debridement 3. Peri-wound Care: Skin Prep 4. Dressing Applied: Prisma Ag 5. Secondary Dressing Applied Bordered Foam Dressing Electronic Signature(s) Signed: 07/15/2017 4:14:16 PM By: Roger Shelter Entered By: Roger Shelter on 07/15/2017 14:02:49 [name]  ([id number]) -------------------------------------------------------------------------------- Wound Assessment Details Patient Name: [name] Date of Service: 07/15/2017 1:45 PM Medical Record Number: [id number] Patient Account Number: [id number] Date of Birth/Sex: [birth year] (71 y.o. M) Treating RN: Roger Shelter Primary Care Kierre Deines: [name] Other Clinician: Referring Rayn Shorb: [name] Treating Yazmyn Valbuena/Extender: [name], [name] [name]: 3 Wound Status Wound Number: 4 Primary Diabetic Wound/Ulcer of the Lower Extremity Etiology: Wound Location: Right Malleolus - Lateral Wound Open Wounding Event: Gradually Appeared Status: Date Acquired: 04/26/2017 Comorbid Cataracts, Middle ear problems, Sleep Apnea, Dustin Of [name]: 3 History: Hypertension, Type II Diabetes, Gout, Clustered Wound: No Osteoarthritis, Neuropathy Photos Photo  Uploaded By: Roger Shelter on 07/15/2017 16:05:32 Wound Measurements Length: (cm) 0.3 Width: (cm) 0.3 Depth: (cm) 0.1 Area: (cm) 0.071 Volume: (cm) 0.007 % Reduction in Area: 74.9% % Reduction in Volume: 75% Epithelialization: Small (1-33%) Tunneling: No Undermining: No Wound Description Classification: Grade 2 Wound Margin: Flat and Intact Exudate Amount: Small Exudate Type: Serous Exudate Color: amber Foul Odor After Cleansing: No Slough/Fibrino No Wound Bed Granulation Amount: None Present (0%) Exposed Structure Necrotic Amount: Large (67-100%) Fascia Exposed: No Necrotic Quality: Eschar, Adherent Slough Fat Layer (Subcutaneous Tissue) Exposed: Yes Tendon Exposed: No Muscle Exposed: No Joint Exposed: No Bone Exposed: No Periwound Skin Texture Jakubowicz, Sebastain E. ([id number]) Texture Color No Abnormalities Noted: No No Abnormalities Noted: No Callus: No Atrophie Blanche: No Crepitus: No Cyanosis: No Excoriation: No Ecchymosis: No Induration: No Erythema: Yes Rash: No Hemosiderin  Staining: No Scarring: No Mottled: No Pallor: No Moisture Rubor: No No Abnormalities Noted: No Dry / Scaly: No Temperature / Pain Maceration: No Temperature: No Abnormality Tenderness on Palpation: Yes Wound Preparation Ulcer Cleansing: Rinsed/Irrigated with Saline Topical Anesthetic Applied: Other: lidocaine 4%, [name] Notes Wound #4 (Right, Lateral Malleolus) 1. Cleansed with: Clean wound with Normal Saline 2. Anesthetic Topical Lidocaine 4% cream to wound bed prior to debridement 3. Peri-wound Care: Skin Prep 4. Dressing Applied: Prisma Ag 5. Secondary Dressing Applied Bordered Foam Dressing Electronic Signature(s) Signed: 07/15/2017 4:14:16 PM By: Roger Shelter Entered By: Roger Shelter on 07/15/2017 14:03:33 [name] ([id number]) -------------------------------------------------------------------------------- [place] Details Patient Name: [name] Date of Service: 07/15/2017 1:45 PM Medical Record Number: [id number] Patient Account Number: [id number] Date of Birth/Sex: Dec 15, 1945 (71 y.o. M) Treating RN: Roger Shelter Primary Care Hatsumi Steinhart: [name] Other Clinician: Referring Mliss Wedin: [name] Treating Reesha Debes/Extender: [name], [name] [name]: 3 Vital Signs Time Taken: 13:57 Temperature (F): 98.0 Height (in): 71 Pulse (bpm): 115 Weight (lbs): 232 Respiratory Rate (breaths/min): 18 Body Mass Index (BMI): 32.4 Blood Pressure (mmHg): 183/86 Reference Range: 80 - 120 mg / dl Electronic Signature(s) Signed: 07/15/2017 4:14:16 PM By: Roger Shelter Entered By: Roger Shelter on 07/15/2017 13:57:42

## 2017-07-22 ENCOUNTER — Encounter: Payer: Medicare Other | Admitting: Physician Assistant

## 2017-07-22 DIAGNOSIS — L03031 Cellulitis of right toe: Secondary | ICD-10-CM | POA: Diagnosis not present

## 2017-07-22 DIAGNOSIS — E11621 Type 2 diabetes mellitus with foot ulcer: Secondary | ICD-10-CM | POA: Diagnosis not present

## 2017-07-22 DIAGNOSIS — L539 Erythematous condition, unspecified: Secondary | ICD-10-CM | POA: Diagnosis not present

## 2017-07-22 DIAGNOSIS — L97522 Non-pressure chronic ulcer of other part of left foot with fat layer exposed: Secondary | ICD-10-CM | POA: Diagnosis not present

## 2017-07-22 DIAGNOSIS — I129 Hypertensive chronic kidney disease with stage 1 through stage 4 chronic kidney disease, or unspecified chronic kidney disease: Secondary | ICD-10-CM | POA: Diagnosis not present

## 2017-07-22 DIAGNOSIS — E11622 Type 2 diabetes mellitus with other skin ulcer: Secondary | ICD-10-CM | POA: Diagnosis not present

## 2017-07-22 DIAGNOSIS — L97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 with fat layer exposed: Secondary | ICD-10-CM | POA: Diagnosis not present

## 2017-07-26 NOTE — Progress Notes (Deleted)
I signed all my cosign requests.. How do I sign this?

## 2017-07-26 NOTE — Progress Notes (Signed)
LYNX, GOODRICH ([id number]) Visit Report for 07/22/2017 Arrival Information Details Patient Name: Dustin, [name] Date of Service: 07/22/2017 2:45 PM Medical Record Number: [id number] Patient Account Number: [id number] Date of Birth/Sex: Apr 28, 1945 (72 y.o. M) Treating RN: Roger Shelter Primary Care Anahid Eskelson: [name] Other Clinician: Referring Lashon Hillier: [name] Treating Destyni Hoppel/Extender: Melburn Hake, HOYT Weeks in Treatment: 4 Visit Information History Since Last Visit All ordered tests and consults were completed: No Patient Arrived: Ambulatory Added or deleted any medications: No Arrival Time: 14:53 Any new allergies or adverse reactions: No Accompanied By: self Had a fall or experienced change in No Transfer Assistance: None activities of daily living that may affect Patient Identification Verified: Yes risk of falls: Secondary Verification Process Completed: Yes Signs or symptoms of abuse/neglect since last visito No Hospitalized since last visit: No Implantable device outside of the clinic excluding No cellular tissue based products placed in the center since last visit: Pain Present Now: No Electronic Signature(s) Signed: 07/22/2017 4:04:12 PM By: Roger Shelter Entered By: Roger Shelter on 07/22/2017 14:53:47 [name] ([id number]) -------------------------------------------------------------------------------- Clinic Level of Care Assessment Details Patient Name: [name] Date of Service: 07/22/2017 2:45 PM Medical Record Number: [id number] Patient Account Number: [id number] Date of Birth/Sex: [birth year] (71 y.o. M) Treating RN: Montey Hora Primary Care Destry Bezdek: [name] Other Clinician: Referring Edithe Dobbin: [name] Treating Amilliana Hayworth/Extender: Melburn Hake, HOYT Weeks in Treatment: 4 Clinic Level of Care Assessment Items TOOL 4 Quantity Score []  - Use when only an EandM is performed on FOLLOW-UP visit  0 ASSESSMENTS - Nursing Assessment / Reassessment X - Reassessment of Co-morbidities (includes updates in patient status) 1 10 X- 1 5 Reassessment of Adherence to Treatment Plan ASSESSMENTS - Wound and Skin Assessment / Reassessment []  - Simple Wound Assessment / Reassessment - one wound 0 X- 2 5 Complex Wound Assessment / Reassessment - multiple wounds []  - 0 Dermatologic / Skin Assessment (not related to wound area) ASSESSMENTS - Focused Assessment []  - Circumferential Edema Measurements - multi extremities 0 []  - 0 Nutritional Assessment / Counseling / Intervention X- 1 5 Lower Extremity Assessment (monofilament, tuning fork, pulses) []  - 0 Peripheral Arterial Disease Assessment (using hand held doppler) ASSESSMENTS - Ostomy and/or Continence Assessment and Care []  - Incontinence Assessment and Management 0 []  - 0 Ostomy Care Assessment and Management (repouching, etc.) PROCESS - Coordination of Care X - Simple Patient / Family Education for ongoing care 1 15 []  - 0 Complex (extensive) Patient / Family Education for ongoing care []  - 0 Staff obtains Programmer, systems, Records, Test Results / Process Orders []  - 0 Staff telephones HHA, Nursing Homes / Clarify orders / etc []  - 0 Routine Transfer to another Facility (non-emergent condition) []  - 0 Routine Hospital Admission (non-emergent condition) []  - 0 New Admissions / Biomedical engineer / Ordering NPWT, Apligraf, etc. []  - 0 Emergency Hospital Admission (emergent condition) X- 1 10 Simple Discharge Coordination Dustin, [name] ([id number]) []  - 0 Complex (extensive) Discharge Coordination PROCESS - Special Needs []  - Pediatric / Minor Patient Management 0 []  - 0 Isolation Patient Management []  - 0 Hearing / Language / Visual special needs []  - 0 Assessment of Community assistance (transportation, D/C planning, etc.) []  - 0 Additional assistance / Altered mentation []  - 0 Support Surface(s) Assessment (bed,  cushion, seat, etc.) INTERVENTIONS - Wound Cleansing / Measurement []  - Simple Wound Cleansing - one wound 0 X- 2 5 Complex Wound Cleansing - multiple wounds X- 1 5 Wound Imaging (photographs -  any number of wounds) []  - 0 Wound Tracing (instead of photographs) []  - 0 Simple Wound Measurement - one wound X- 2 5 Complex Wound Measurement - multiple wounds INTERVENTIONS - Wound Dressings X - Small Wound Dressing one or multiple wounds 2 10 []  - 0 Medium Wound Dressing one or multiple wounds []  - 0 Large Wound Dressing one or multiple wounds []  - 0 Application of Medications - topical []  - 0 Application of Medications - injection INTERVENTIONS - Miscellaneous []  - External ear exam 0 []  - 0 Specimen Collection (cultures, biopsies, blood, body fluids, etc.) []  - 0 Specimen(s) / Culture(s) sent or taken to Lab for analysis []  - 0 Patient Transfer (multiple staff / Civil Service fast streamer / Similar devices) []  - 0 Simple Staple / Suture removal (25 or less) []  - 0 Complex Staple / Suture removal (26 or more) []  - 0 Hypo / Hyperglycemic Management (close monitor of Blood Glucose) []  - 0 Ankle / Brachial Index (ABI) - do not check if billed separately X- 1 5 Vital Signs Dustin, DONALD E. ([id number]) Has the patient been seen at the hospital within the last three years: Yes Total Score: 105 Level Of Care: New/Established - Level 3 Electronic Signature(s) Signed: 07/22/2017 4:45:51 PM By: Montey Hora Entered By: Montey Hora on 07/22/2017 15:23:52 [name] ([id number]) -------------------------------------------------------------------------------- Encounter Discharge Information Details Patient Name: [name] Date of Service: 07/22/2017 2:45 PM Medical Record Number: [id number] Patient Account Number: [id number] Date of Birth/Sex: Aug 26, 1945 (71 y.o. M) Treating RN: Montey Hora Primary Care Payslee Bateson: [name] Other Clinician: Referring Alix Lahmann:  [name] Treating Markesha Hannig/Extender: Melburn Hake, HOYT Weeks in Treatment: 4 Encounter Discharge Information Items Discharge Pain Level: 0 Discharge Condition: Stable Ambulatory Status: Ambulatory Discharge Destination: Home Transportation: Private Auto Accompanied By: self Schedule Follow-up Appointment: Yes Medication Reconciliation completed and No provided to Patient/Care Sony Schlarb: Provided on Clinical Summary of Care: 07/22/2017 Form Type Recipient Paper Patient JJ Electronic Signature(s) Signed: 07/22/2017 4:32:37 PM By: Ruthine Dose Entered By: Ruthine Dose on 07/22/2017 15:36:40 [name] ([id number]) -------------------------------------------------------------------------------- Lower Extremity Assessment Details Patient Name: [name] Date of Service: 07/22/2017 2:45 PM Medical Record Number: [id number] Patient Account Number: [id number] Date of Birth/Sex: Mar 25, 1946 (71 y.o. M) Treating RN: Roger Shelter Primary Care Ayaan Ringle: [name] Other Clinician: Referring Shomari Scicchitano: [name] Treating Larine Fielding/Extender: Melburn Hake, HOYT Weeks in Treatment: 4 Edema Assessment Assessed: [Left: No] [Right: No] Edema: [Left: No] [Right: No] Vascular Assessment Claudication: Claudication Assessment [Left:None] [Right:None] Pulses: Dorsalis Pedis Palpable: [Left:Yes] [Right:Yes] Posterior Tibial Extremity colors, hair growth, and conditions: Extremity Color: [Left:Normal] [Right:Normal] Hair Growth on Extremity: [Left:Yes] [Right:Yes] Temperature of Extremity: [Left:Warm] [Right:Warm] Capillary Refill: [Left:< 3 seconds] [Right:< 3 seconds] Toe Nail Assessment Left: Right: Thick: Yes Yes Discolored: Yes Yes Deformed: No No Improper Length and Hygiene: Yes Yes Electronic Signature(s) Signed: 07/22/2017 4:04:12 PM By: Roger Shelter Entered By: Roger Shelter on 07/22/2017 15:00:33 [name]  ([id number]) -------------------------------------------------------------------------------- Multi Wound Chart Details Patient Name: [name] Date of Service: 07/22/2017 2:45 PM Medical Record Number: [id number] Patient Account Number: [id number] Date of Birth/Sex: 1946-01-19 (71 y.o. M) Treating RN: Montey Hora Primary Care Kaveon Blatz: [name] Other Clinician: Referring Nazaret Chea: [name] Treating Tahara Ruffini/Extender: [name], HOYT Weeks in Treatment: 4 Vital Signs Height(in): 71 Pulse(bpm): 77 Weight(lbs): 232 Blood Pressure(mmHg): 142/67 Body Mass Index(BMI): 32 Temperature(F): 97.5 Respiratory Rate 18 (breaths/min): Photos: [3:No Photos] [4:No Photos] [N/A:N/A] Wound Location: [3:Left Foot - Dorsal] [4:Right Malleolus - Lateral] [N/A:N/A] Wounding  Event: [3:Gradually Appeared] [4:Gradually Appeared] [N/A:N/A] Primary Etiology: [3:Diabetic Wound/Ulcer of the Lower Extremity] [4:Diabetic Wound/Ulcer of the Lower Extremity] [N/A:N/A] Comorbid History: [3:Cataracts, Middle ear problems, Sleep Apnea, Hypertension, Type II Diabetes, Gout, Osteoarthritis, Neuropathy] [4:Cataracts, Middle ear problems, Sleep Apnea, Hypertension, Type II Diabetes, Gout, Osteoarthritis, Neuropathy]  [N/A:N/A] Date Acquired: [3:04/26/2017] [4:04/26/2017] [N/A:N/A] Weeks of Treatment: [3:4] [4:4] [N/A:N/A] Wound Status: [3:Open] [4:Open] [N/A:N/A] Measurements L x W x D [3:0.4x0.5x0.1] [4:0.2x0.2x0.1] [N/A:N/A] (cm) Area (cm) : [3:0.157] [4:0.031] [N/A:N/A] Volume (cm) : [3:0.016] [4:0.003] [N/A:N/A] % Reduction in Area: [3:68.30%] [4:89.00%] [N/A:N/A] % Reduction in Volume: [3:67.30%] [4:89.30%] [N/A:N/A] Classification: [3:Grade 3] [4:Grade 2] [N/A:N/A] Exudate Amount: [3:Medium] [4:Medium] [N/A:N/A] Exudate Type: [3:Serosanguineous] [4:Serous] [N/A:N/A] Exudate Color: [3:red, brown] [4:amber] [N/A:N/A] Wound Margin: [3:Flat and Intact] [4:Flat and Intact]  [N/A:N/A] Granulation Amount: [3:Medium (34-66%)] [4:None Present (0%)] [N/A:N/A] Granulation Quality: [3:Red] [4:N/A] [N/A:N/A] Necrotic Amount: [3:Medium (34-66%)] [4:Large (67-100%)] [N/A:N/A] Necrotic Tissue: [3:Adherent Slough] [4:Eschar, Adherent Slough] [N/A:N/A] Exposed Structures: [3:Fat Layer (Subcutaneous Tissue) Exposed: Yes Fascia: No Tendon: No Muscle: No Joint: No Bone: No] [4:Fat Layer (Subcutaneous Tissue) Exposed: Yes Fascia: No Tendon: No Muscle: No Joint: No Bone: No] [N/A:N/A] Epithelialization: [3:None] [4:Small (1-33%)] [N/A:N/A] Periwound Skin Texture: [N/A:N/A] Excoriation: No Excoriation: No Induration: No Induration: No Callus: No Callus: No Crepitus: No Crepitus: No Rash: No Rash: No Scarring: No Scarring: No Periwound Skin Moisture: Maceration: Yes Maceration: No N/A Dry/Scaly: No Dry/Scaly: No Periwound Skin Color: Erythema: Yes Erythema: Yes N/A Atrophie Blanche: No Atrophie Blanche: No Cyanosis: No Cyanosis: No Ecchymosis: No Ecchymosis: No Hemosiderin Staining: No Hemosiderin Staining: No Mottled: No Mottled: No Pallor: No Pallor: No Rubor: No Rubor: No Erythema Location: Circumferential N/A N/A Temperature: No Abnormality No Abnormality N/A Tenderness on Palpation: Yes Yes N/A Wound Preparation: Ulcer Cleansing: Ulcer Cleansing: N/A Rinsed/Irrigated with Saline Rinsed/Irrigated with Saline Topical Anesthetic Applied: Topical Anesthetic Applied: Other: lidocaine 4% Other: lidocaine 4% Treatment Notes Electronic Signature(s) Signed: 07/22/2017 4:45:51 PM By: Montey Hora Entered By: Montey Hora on 07/22/2017 15:16:28 [name] ([id number]) -------------------------------------------------------------------------------- [place] Details Patient Name: [name] Date of Service: 07/22/2017 2:45 PM Medical Record Number: [id number] Patient Account Number: [id number] Date of Birth/Sex:  05/18/1945 (71 y.o. M) Treating RN: Montey Hora Primary Care Jerman Tinnon: [name] Other Clinician: Referring [name] Portlock: [name] Treating Cort Dragoo/Extender: Melburn Hake, HOYT Weeks in Treatment: 4 Active Inactive ` Abuse / Safety / Falls / Self Care Management Nursing Diagnoses: Potential for falls Goals: Patient will remain injury free related to falls Date Initiated: 06/24/2017 Target Resolution Date: 09/17/2017 Goal Status: Active Interventions: Assess fall risk on admission and as needed Notes: ` Orientation to the Wound Care Program Nursing Diagnoses: Knowledge deficit related to the wound healing center program Goals: Patient/caregiver will verbalize understanding of the [place] Date Initiated: 06/24/2017 Target Resolution Date: 09/17/2017 Goal Status: Active Interventions: Provide education on orientation to the wound center Notes: ` Wound/Skin Impairment Nursing Diagnoses: Impaired tissue integrity Goals: Ulcer/skin breakdown will heal within 14 weeks Date Initiated: 06/24/2017 Target Resolution Date: 09/17/2017 Goal Status: Active Interventions: Dustin, [name] ([id number]) Assess patient/caregiver ability to obtain necessary supplies Assess patient/caregiver ability to perform ulcer/skin care regimen upon admission and as needed Assess ulceration(s) every visit Notes: Electronic Signature(s) Signed: 07/22/2017 4:45:51 PM By: Montey Hora Entered By: Montey Hora on 07/22/2017 15:16:21 [name] ([id number]) -------------------------------------------------------------------------------- Pain Assessment Details Patient Name: [name] Date of Service: 07/22/2017 2:45 PM Medical Record Number: [id number] Patient Account Number: [id number] Date of Birth/Sex:  Jan 04, 1946 (72 y.o. M) Treating RN: Roger Shelter Primary Care Pearlina Friedly: [name] Other Clinician: Referring Henriette Hesser: [name] Treating  Kelten Enochs/Extender: Melburn Hake, HOYT Weeks in Treatment: 4 Active Problems Location of Pain Severity and Description of Pain Patient Has Paino No Site Locations Pain Management and Medication Current Pain Management: Electronic Signature(s) Signed: 07/22/2017 4:04:12 PM By: Roger Shelter Entered By: Roger Shelter on 07/22/2017 14:55:39 [name] ([id number]) -------------------------------------------------------------------------------- Patient/Caregiver Education Details Patient Name: [name] Date of Service: 07/22/2017 2:45 PM Medical Record Number: [id number] Patient Account Number: [id number] Date of Birth/Gender: [birth year] (71 y.o. M) Treating RN: Montey Hora Primary Care Physician: [name] Other Clinician: Referring Physician: [name] Treating Physician/Extender: Sharalyn Ink in Treatment: 4 Education Assessment Education Provided To: Patient Education Topics Provided Wound/Skin Impairment: Handouts: Caring for Your Ulcer, Other: change dressing as ordered Methods: Demonstration, Explain/Verbal Responses: State content correctly Electronic Signature(s) Signed: 07/22/2017 4:45:51 PM By: Montey Hora Entered By: Montey Hora on 07/22/2017 15:34:52 [name] ([id number]) -------------------------------------------------------------------------------- Wound Assessment Details Patient Name: [name] Date of Service: 07/22/2017 2:45 PM Medical Record Number: [id number] Patient Account Number: [id number] Date of Birth/Sex: 01/27/1946 (71 y.o. M) Treating RN: Roger Shelter Primary Care [name]: [name] Other Clinician: Referring Donzel Romack: [name] Treating Carma Dwiggins/Extender: [name], HOYT Weeks in Treatment: 4 Wound Status Wound Number: 3 Primary Diabetic Wound/Ulcer of the Lower Extremity Etiology: Wound Location: Left Foot - Dorsal Wound Open Wounding Event: Gradually  Appeared Status: Date Acquired: 04/26/2017 Comorbid Cataracts, Middle ear problems, Sleep Apnea, Weeks Of Treatment: 4 History: Hypertension, Type II Diabetes, Gout, Clustered Wound: No Osteoarthritis, Neuropathy Photos Photo Uploaded By: Roger Shelter on 07/22/2017 16:11:11 Wound Measurements Length: (cm) 0.4 Width: (cm) 0.5 Depth: (cm) 0.1 Area: (cm) 0.157 Volume: (cm) 0.016 % Reduction in Area: 68.3% % Reduction in Volume: 67.3% Epithelialization: None Tunneling: No Undermining: No Wound Description Classification: Grade 3 Wound Margin: Flat and Intact Exudate Amount: Medium Exudate Type: Serosanguineous Exudate Color: red, brown Foul Odor After Cleansing: No Slough/Fibrino Yes Wound Bed Granulation Amount: Medium (34-66%) Exposed Structure Granulation Quality: Red Fascia Exposed: No Necrotic Amount: Medium (34-66%) Fat Layer (Subcutaneous Tissue) Exposed: Yes Necrotic Quality: Adherent Slough Tendon Exposed: No Muscle Exposed: No Joint Exposed: No Bone Exposed: No Periwound Skin Texture [name], Dustin E. ([id number]) Texture Color No Abnormalities Noted: No No Abnormalities Noted: No Callus: No Atrophie Blanche: No Crepitus: No Cyanosis: No Excoriation: No Ecchymosis: No Induration: No Erythema: Yes Rash: No Erythema Location: Circumferential Scarring: No Hemosiderin Staining: No Mottled: No Moisture Pallor: No No Abnormalities Noted: No Rubor: No Dry / Scaly: No Maceration: Yes Temperature / Pain Temperature: No Abnormality Tenderness on Palpation: Yes Wound Preparation Ulcer Cleansing: Rinsed/Irrigated with Saline Topical Anesthetic Applied: Other: lidocaine 4%, Treatment Notes Wound #3 (Left, Dorsal Foot) 1. Cleansed with: Clean wound with Normal Saline 2. Anesthetic Topical Lidocaine 4% cream to wound bed prior to debridement 4. Dressing Applied: Prisma Ag 5. Secondary Dressing Applied Bordered Foam Dressing Electronic  Signature(s) Signed: 07/22/2017 4:04:12 PM By: Roger Shelter Entered By: Roger Shelter on 07/22/2017 14:59:22 [name] ([id number]) -------------------------------------------------------------------------------- Wound Assessment Details Patient Name: [name] Date of Service: 07/22/2017 2:45 PM Medical Record Number: [id number] Patient Account Number: [id number] Date of Birth/Sex: [birth year] (71 y.o. M) Treating RN: Montey Hora Primary Care Glover Capano: [name] Other Clinician: Referring Laydon Martis: [name] Treating Imagene Boss/Extender: [name], HOYT Weeks in Treatment: 4 Wound Status Wound Number: 4 Primary Diabetic Wound/Ulcer of the Lower Extremity Etiology:  Wound Location: Right, Lateral Malleolus Wound Open Wounding Event: Gradually Appeared Status: Date Acquired: 04/26/2017 Comorbid Cataracts, Middle ear problems, Sleep Apnea, Weeks Of Treatment: 4 History: Hypertension, Type II Diabetes, Gout, Clustered Wound: No Osteoarthritis, Neuropathy Photos Photo Uploaded By: Roger Shelter on 07/22/2017 16:11:11 Wound Measurements Length: (cm) 0.1 Width: (cm) 0.1 Depth: (cm) 0.1 Area: (cm) 0.008 Volume: (cm) 0.001 % Reduction in Area: 97.2% % Reduction in Volume: 96.4% Epithelialization: Small (1-33%) Tunneling: No Undermining: No Wound Description Classification: Grade 2 Wound Margin: Flat and Intact Exudate Amount: Medium Exudate Type: Serous Exudate Color: amber Foul Odor After Cleansing: No Slough/Fibrino No Wound Bed Granulation Amount: None Present (0%) Exposed Structure Necrotic Amount: Large (67-100%) Fascia Exposed: No Necrotic Quality: Eschar, Adherent Slough Fat Layer (Subcutaneous Tissue) Exposed: Yes Tendon Exposed: No Muscle Exposed: No Joint Exposed: No Bone Exposed: No Periwound Skin Texture [name], Dustin E. ([id number]) Texture Color No Abnormalities Noted: No No Abnormalities Noted: No Callus:  No Atrophie Blanche: No Crepitus: No Cyanosis: No Excoriation: No Ecchymosis: No Induration: No Erythema: Yes Rash: No Hemosiderin Staining: No Scarring: No Mottled: No Pallor: No Moisture Rubor: No No Abnormalities Noted: No Dry / Scaly: No Temperature / Pain Maceration: No Temperature: No Abnormality Tenderness on Palpation: Yes Wound Preparation Ulcer Cleansing: Rinsed/Irrigated with Saline Topical Anesthetic Applied: Other: lidocaine 4%, Treatment Notes Wound #4 (Right, Lateral Malleolus) 1. Cleansed with: Clean wound with Normal Saline 2. Anesthetic Topical Lidocaine 4% cream to wound bed prior to debridement 5. Secondary [place] Signature(s) Signed: 07/22/2017 4:45:51 PM By: Montey Hora Entered By: Montey Hora on 07/22/2017 15:17:11 [name] ([id number]) -------------------------------------------------------------------------------- [place] Details Patient Name: [name] Date of Service: 07/22/2017 2:45 PM Medical Record Number: [id number] Patient Account Number: [id number] Date of Birth/Sex: Mar 01, 1946 (71 y.o. M) Treating RN: Roger Shelter Primary Care Alyzabeth Pontillo: [name] Other Clinician: Referring Tomer Chalmers: [name] Treating Brighten Orndoff/Extender: Melburn Hake, HOYT Weeks in Treatment: 4 Vital Signs Time Taken: 14:55 Temperature (F): 97.5 Height (in): 71 Pulse (bpm): 77 Weight (lbs): 232 Respiratory Rate (breaths/min): 18 Body Mass Index (BMI): 32.4 Blood Pressure (mmHg): 142/67 Reference Range: 80 - 120 mg / dl Electronic Signature(s) Signed: 07/22/2017 4:04:12 PM By: Roger Shelter Entered By: Roger Shelter on 07/22/2017 14:56:08

## 2017-07-28 ENCOUNTER — Ambulatory Visit (INDEPENDENT_AMBULATORY_CARE_PROVIDER_SITE_OTHER): Payer: Medicare Other

## 2017-07-28 DIAGNOSIS — E349 Endocrine disorder, unspecified: Secondary | ICD-10-CM | POA: Diagnosis not present

## 2017-07-28 MED ORDER — TESTOSTERONE CYPIONATE 200 MG/ML IM SOLN
150.0000 mg | Freq: Once | INTRAMUSCULAR | Status: AC
  Administered 2017-07-28: 150 mg via INTRAMUSCULAR

## 2017-07-29 NOTE — Progress Notes (Signed)
[name], [name] ([id number]) Visit Report for 07/22/2017 Chief Complaint Document Details Patient Name: [name], [name] Date of Service: 07/22/2017 2:45 PM Medical Record Number: [id number] Patient Account Number: [id number] Date of Birth/Sex: 1945/05/23 (72 y.o. M) Treating RN: Montey Hora Primary Care Provider: [name] Other Clinician: Referring Provider: [name] Treating Provider/Extender: Melburn Hake,  Weeks in Treatment: 4 Information Obtained from: Patient Chief Complaint Right lateral Malleolus ulcer and left dorsal foot ulcer Electronic Signature(s) Signed: 07/24/2017 12:59:06 AM By: Worthy Keeler [name] Entered By: Worthy Keeler on 07/22/2017 14:50:11 [name] ([id number]) -------------------------------------------------------------------------------- HPI Details Patient Name: [name] Date of Service: 07/22/2017 2:45 PM Medical Record Number: [id number] Patient Account Number: [id number] Date of Birth/Sex: 1945/05/31 (71 y.o. M) Treating RN: Montey Hora Primary Care Provider: [name] Other Clinician: Referring Provider: [name] Treating Provider/Extender: Melburn Hake,  Weeks in Treatment: 4 History of Present Illness HPI Description: 10/04/16 on evaluation today patient presents for initial evaluation concerning a laceration of the right wrist and hand on the bowler aspect. He tells me that he fell into glass following a medication change in regard to his diabetes. He initially went to [facility] where she was sutured and subsequently his daughter who is a Marine scientist removed the teachers at the appropriate time. Unfortunately the wound has done poorly and dehissed which has caused him some trouble including infection. He has been on antibiotics both topically and orally though the wound continues to show signs of necrotic tissue according to notes which is what he was sent to [place] for evaluation concerning. He does  have diabetes and unfortunately this is severely uncontrolled. He does see an endocrinologist and is working on this. 10/11/2016 -- the patient's notes from the ER were reviewed and he had his suturing done on 09/17/2016 and had necrotic skin flaps which had to be trimmed the last visit he was here. He has managed to get his Santyl ointment and has been using this appropriately. 10/21/16 patient's wound appears to be doing very well on evaluation today. He still has some Slough covering the wound bed but this is nowhere near as severe as when i saw this initially two weeks ago nor even my review of his pictures last week. I'm pleased with how this is progressing. Patient's wound over the right form appears to be doing fairly well on evaluation today. There is no evidence of infection and he has a small area at the crease of where she is wrist extends and flexes that is still open and appears to be having difficulty due to the fact that it is cracking open. I think this is something that may be controlled with moisture control that is keeping the wound a little bit more moist. Nonetheless this is a tiny region compared to the initial wound and overall appears to be doing very well. No fevers, chills, nausea, or vomiting noted at this time. 11/25/16 on evaluation today patient appears to be doing well in regard to his right wrist ulcer. In fact this appears to be completely healed he is having no discomfort. He is pleased with how this is progressed. Readmission: 06/24/17 on evaluation today patient appears back in our clinic regarding issues that he has been having with his right ankle on the lateral malleolus as well is in the dorsal surface of his left foot. Both have been going on for several weeks unfortunately. He tells me that initially this was being managed by Dr. Edilia Bo and  I did review the notes from Dr. Edilia Bo in epic. Patient was placed on amoxicillin initially and then subsequently  Keflex following. He has been using triple antibiotic ointment over-the-counter along with a Band-Aid for the wound currently. He did not have any Santyl remaining. His most recent hemoglobin A1c which was just two days ago was 10.3. Fortunately he seems to have excellent blood flow in the bilateral lower extremities with his left ABI being 0.95 in the right ABI 1.01. Patient does have some pain in regard to each of these ulcerations but he tells me it's not nearly as severe as the hand wound that I help treat earlier over the summer. This was in 2018. 07/15/17 on evaluation today patient's lower surety ulcer is actually both appear to be doing fairly well at this point. He does have a little bit of skin overlapping the ulcer on the right lateral ankle this was debrided away today just with saline and gauze without complication patient had no discomfort or pain. Otherwise patient's left foot ulcer does appear to be showing signs of improvement he does seem to have a right great toe. Nikki and noted at this point in time. I think that this does need to be managed at this point. No fevers, chills, nausea, or vomiting noted at this time. 07/22/17 on evaluation today patient's wounds in regard to his lower extremities appear to be doing somewhat better. His right ankle wound in fact appears to likely be completely closed. The left dorsal foot ulcer though still open does appear to be a little bit smaller and does also seem to be making good progress. He has not been having problems with the treatment at this [name], [name]. ([id number]) point in the general he does not seem to show as much erythema surrounding the wound bed which is good news. He is not having significant discomfort. Electronic Signature(s) Signed: 07/24/2017 12:59:06 AM By: Worthy Keeler [name] Entered By: Worthy Keeler on 07/23/2017 23:46:36 [name]  ([id number]) -------------------------------------------------------------------------------- Physical Exam Details Patient Name: [name] Date of Service: 07/22/2017 2:45 PM Medical Record Number: [id number] Patient Account Number: [id number] Date of Birth/Sex: 05/12/1945 (71 y.o. M) Treating RN: Montey Hora Primary Care Provider: [name] Other Clinician: Referring Provider: [name] Treating Provider/Extender: [name],  Weeks in Treatment: 4 Constitutional Well-nourished and well-hydrated in no acute distress. Respiratory normal breathing without difficulty. clear to auscultation bilaterally. Cardiovascular regular rate and rhythm with normal S1, S2. no clubbing, cyanosis, significant edema, <3 sec cap refill. Psychiatric this patient is able to make decisions and demonstrates good insight into disease process. Alert and Oriented x 3. pleasant and cooperative. Notes Patient's wound shows no evidence of infection and again in regard to the ankle ulcer this appears to likely be healed on inspection today. The dorsal foot wound on the left was mechanically debrided at this point without complication and post debridement the wound bed did appear to be much Electronic Signature(s) Signed: 07/24/2017 12:59:06 AM By: Worthy Keeler [name] Entered By: Worthy Keeler on 07/23/2017 23:47:27 [name] ([id number]) -------------------------------------------------------------------------------- Physician Orders Details Patient Name: [name] Date of Service: 07/22/2017 2:45 PM Medical Record Number: [id number] Patient Account Number: [id number] Date of Birth/Sex: [birth year] (71 y.o. 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 o May Shower, gently pat wound dry prior to applying new dressing. Wound #4 Right,Lateral Malleolus o Clean wound with Normal Saline. o May Shower, gently pat wound dry prior to applying new dressing. Anesthetic (add to Medication List) Wound #3 Left,Dorsal Foot o Topical Lidocaine 4% cream applied to wound bed prior to debridement (In Clinic Only). Wound #4 Right,Lateral Malleolus o Topical Lidocaine 4% cream applied to wound bed prior to debridement (In Clinic Only). Primary Wound Dressing Wound #3 Left,Dorsal Foot o Silver Collagen Wound #4 Right,Lateral Malleolus o Foam Secondary Dressing Wound #3 Left,Dorsal Foot o Dry Gauze o Boardered Foam Dressing Wound #4 Right,Lateral Malleolus o Boardered Foam Dressing - or [place] dressing [name], Dustin E. ([id number]) Dressing Change Frequency Wound #3 Left,Dorsal Foot o Change dressing every day. Wound #4 Right,Lateral Malleolus o Change dressing every other day. Follow-up Appointments Wound #3 Left,Dorsal Foot o Return Appointment in 1 week. Wound #4 Right,Lateral Malleolus o Return Appointment in 1 week. Additional Orders / Instructions Wound #3 Left,Dorsal Foot o Vitamin A; Vitamin C, Zinc - Please add a multivitamin with 100% of these vitamins and minerals in it o Increase protein intake. o Activity as tolerated Wound #4 Right,Lateral Malleolus o Vitamin A; Vitamin C, Zinc - Please add a multivitamin  with 100% of these vitamins and minerals in it o Increase protein intake. o Activity as tolerated Electronic Signature(s) Signed: 07/22/2017 4:45:51 PM By: Montey Hora Signed: 07/24/2017 12:59:06 AM By: Worthy Keeler [name] Entered By: Montey Hora on 07/22/2017 15:21:32 [name] ([id number]) -------------------------------------------------------------------------------- Problem List Details Patient Name: [name] Date of Service: 07/22/2017 2:45 PM Medical Record Number: [id number] Patient Account Number: [id number] Date of Birth/Sex: 1945/06/09 (71 y.o. M) Treating RN: Montey Hora Primary Care Provider: [name] Other Clinician: Referring Provider: [name] Treating Provider/Extender: Melburn Hake,  Weeks in Treatment: 4 Active Problems ICD-10 Impacting Encounter Code Description Active Date Wound Healing Diagnosis E11.621 Type 2 diabetes mellitus with foot ulcer 06/24/2017 Yes L97.522 Non-pressure chronic ulcer of other part of left foot with fat 06/24/2017 Yes layer exposed L97.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 with fat layer 06/24/2017 Yes exposed L03.031 Cellulitis of right toe 07/15/2017 Yes N18.3 Chronic kidney disease, stage 3 (moderate) 06/24/2017 Yes I10 Essential (primary) hypertension 06/24/2017 Yes E11.40 Type 2 diabetes mellitus with diabetic neuropathy, 06/24/2017 Yes unspecified Inactive Problems Resolved Problems Electronic Signature(s) Signed: 07/24/2017 12:59:06 AM By: Worthy Keeler [name] Entered By: Worthy Keeler on 07/22/2017 14:50:04 [name] ([id number]) -------------------------------------------------------------------------------- Progress Note Details Patient Name: [name] Date of Service: 07/22/2017 2:45 PM Medical Record Number: [id number] Patient Account Number: [id number] Date of Birth/Sex: November 27, 1945 (71 y.o. M) Treating RN: Montey Hora Primary Care Provider: [name] Other Clinician: Referring Provider: [name] Treating Provider/Extender: Melburn Hake,  Weeks in Treatment: 4 Subjective Chief Complaint Information obtained from Patient Right lateral Malleolus ulcer and left dorsal foot ulcer History of Present Illness (HPI) 10/04/16 on evaluation today patient presents for initial evaluation concerning a laceration of the right wrist and hand on the bowler aspect. He tells me that  he fell into glass following a medication change in regard to his diabetes. He initially went to [facility] where she was sutured and subsequently his daughter who is a Marine scientist removed the teachers at the appropriate time. Unfortunately the wound has done poorly and dehissed which has caused him some trouble including infection. He has been on antibiotics both topically and orally though the wound continues to show signs of necrotic tissue according to notes which is what he was sent to [place] for evaluation concerning. He does have diabetes and unfortunately this is severely uncontrolled. He does see an endocrinologist and is working on this. 10/11/2016 -- the patient's notes from the ER were reviewed and he had his suturing done on 09/17/2016 and had necrotic skin flaps which had to be trimmed the last visit he was here. He has managed to get his Santyl ointment and has been using this appropriately. 10/21/16 patient's wound appears to be doing very well on evaluation today. He still has some Slough covering the wound bed but this is nowhere near as severe as when i saw this initially two weeks ago nor even my review of his pictures last week. I'm pleased with how this is progressing. Patient's wound over the right form appears to be doing fairly well on evaluation today. There is no evidence of infection and he has a small area at the crease of where she is wrist extends and flexes that is still open and appears to be having difficulty due to the fact that it is  cracking open. I think this is something that may be controlled with moisture control that is keeping the wound a little bit more moist. Nonetheless this is a tiny region compared to the initial wound and overall appears to be doing very well. No fevers, chills, nausea, or vomiting noted at this time. 11/25/16 on evaluation today patient appears to be doing well in regard to his right wrist ulcer. In fact this appears to be completely healed he is having no discomfort. He is pleased with how this is progressed. Readmission: 06/24/17 on evaluation today patient appears back in our clinic regarding issues that he has been having with his right ankle on the lateral malleolus as well is in the dorsal surface of his left foot. Both have been going on for several weeks unfortunately. He tells me that initially this was being managed by Dr. Edilia Bo and I did review the notes from Dr. Edilia Bo in epic. Patient was placed on amoxicillin initially and then subsequently Keflex following. He has been using triple antibiotic ointment over-the-counter along with a Band-Aid for the wound currently. He did not have any Santyl remaining. His most recent hemoglobin A1c which was just two days ago was 10.3. Fortunately he seems to have excellent blood flow in the bilateral lower extremities with his left ABI being 0.95 in the right ABI 1.01. Patient does have some pain in regard to each of these ulcerations but he tells me it's not nearly as severe as the hand wound that I help treat earlier over the summer. This was in 2018. 07/15/17 on evaluation today patient's lower surety ulcer is actually both appear to be doing fairly well at this point. He does have a little bit of skin overlapping the ulcer on the right lateral ankle this was debrided away today just with saline and gauze without complication patient had no discomfort or pain. Otherwise patient's left foot ulcer does appear to be showing signs of improvement  he  does seem to have a right great toe. Nikki and noted at this point in time. I think that this does need to be [name], [name]. ([id number]) managed at this point. No fevers, chills, nausea, or vomiting noted at this time. 07/22/17 on evaluation today patient's wounds in regard to his lower extremities appear to be doing somewhat better. His right ankle wound in fact appears to likely be completely closed. The left dorsal foot ulcer though still open does appear to be a little bit smaller and does also seem to be making good progress. He has not been having problems with the treatment at this point in the general he does not seem to show as much erythema surrounding the wound bed which is good news. He is not having significant discomfort. Patient History Information obtained from Patient. Family History Cancer - Siblings, Diabetes - Father, Hypertension - Father, Kidney Disease - Mother, Stroke - Father, No family history of Heart Disease, Lung Disease, Thyroid Problems, Tuberculosis. Social History Former smoker, Marital Status - Widowed, Alcohol Use - Never, Drug Use - No History, Caffeine Use - Moderate. Medical And Surgical History Notes Cardiovascular CVA in 2015 Genitourinary "weak kidneys" - is a patient at [facility] ([facility]) Constitutional Symptoms (General Health) Denies complaints or symptoms of Fever, Chills. Respiratory The patient has no complaints or symptoms. Cardiovascular The patient has no complaints or symptoms. Psychiatric The patient has no complaints or symptoms. Objective Constitutional Well-nourished and well-hydrated in no acute distress. Vitals Time Taken: 2:55 PM, Height: 71 in, Weight: 232 lbs, BMI: 32.4, Temperature: 97.5 F, Pulse: 77 bpm, Respiratory Rate: 18 breaths/min, Blood Pressure: 142/67 mmHg. Respiratory normal breathing without difficulty. clear to auscultation bilaterally. Cardiovascular regular rate and rhythm with  normal S1, S2. no clubbing, cyanosis, significant edema, Psychiatric this patient is able to make decisions and demonstrates good insight into disease process. Alert and Oriented x 3. pleasant [name], [name]. ([id number]) and cooperative. General Notes: Patient's wound shows no evidence of infection and again in regard to the ankle ulcer this appears to likely be healed on inspection today. The dorsal foot wound on the left was mechanically debrided at this point without complication and post debridement the wound bed did appear to be much Integumentary (Hair, Skin) Wound #3 status is Open. Original cause of wound was Gradually Appeared. The wound is located on the Left,Dorsal Foot. The wound measures 0.4cm length x 0.5cm width x 0.1cm depth; 0.157cm^2 area and 0.016cm^3 volume. There is Fat Layer (Subcutaneous Tissue) Exposed exposed. There is no tunneling or undermining noted. There is a medium amount of serosanguineous drainage noted. The wound margin is flat and intact. There is medium (34-66%) red granulation within the wound bed. There is a medium (34-66%) amount of necrotic tissue within the wound bed including Adherent Slough. The periwound skin appearance exhibited: Maceration, Erythema. The periwound skin appearance did not exhibit: Callus, Crepitus, Excoriation, Induration, Rash, Scarring, Dry/Scaly, Atrophie Blanche, Cyanosis, Ecchymosis, Hemosiderin Staining, Mottled, Pallor, Rubor. The surrounding wound skin color is noted with erythema which is circumferential. Periwound temperature was noted as No Abnormality. The periwound has tenderness on palpation. Wound #4 status is Open. Original cause of wound was Gradually Appeared. The wound is located on the Right,Lateral Malleolus. The wound measures 0.1cm length x 0.1cm width x 0.1cm depth; 0.008cm^2 area and 0.001cm^3 volume. There is Fat Layer (Subcutaneous Tissue) Exposed exposed. There is no tunneling or undermining noted. There  is a medium amount of serous  drainage noted. The wound margin is flat and intact. There is no granulation within the wound bed. There is a large (67-100%) amount of necrotic tissue within the wound bed including Eschar and Adherent Slough. The periwound skin appearance exhibited: Erythema. The periwound skin appearance did not exhibit: Callus, Crepitus, Excoriation, Induration, Rash, Scarring, Dry/Scaly, Maceration, Atrophie Blanche, Cyanosis, Ecchymosis, Hemosiderin Staining, Mottled, Pallor, Rubor. The surrounding wound skin color is noted with erythema. Periwound temperature was noted as No Abnormality. The periwound has tenderness on palpation. Assessment Active Problems ICD-10 E11.621 - Type 2 diabetes mellitus with foot ulcer L97.522 - Non-pressure chronic ulcer of other part of left foot with fat layer exposed L97.312 - Non-pressure chronic ulcer of right ankle with fat layer exposed L03.031 - Cellulitis of right toe N18.3 - Chronic kidney disease, stage 3 (moderate) I10 - Essential (primary) hypertension E11.40 - Type 2 diabetes mellitus with diabetic neuropathy, unspecified Plan Wound Cleansing: Wound #3 Left,Dorsal Foot: Clean wound with Normal Saline. May Shower, gently pat wound dry prior to applying new dressing. Wound #4 Right,Lateral Malleolus: Clean wound with Normal Saline. May Shower, gently pat wound dry prior to applying new dressing. [name], [name] ([id number]) Anesthetic (add to Medication List): Wound #3 Left,Dorsal Foot: Topical Lidocaine 4% cream applied to wound bed prior to debridement (In Clinic Only). Wound #4 Right,Lateral Malleolus: Topical Lidocaine 4% cream applied to wound bed prior to debridement (In Clinic Only). Primary Wound Dressing: Wound #3 Left,Dorsal Foot: Silver Collagen Wound #4 Right,Lateral Malleolus: Foam Secondary Dressing: Wound #3 Left,Dorsal Foot: Dry Gauze Boardered Foam Dressing Wound #4 Right,Lateral  Malleolus: Boardered Foam Dressing - or [place] dressing Dressing Change Frequency: Wound #3 Left,Dorsal Foot: Change dressing every day. Wound #4 Right,Lateral Malleolus: Change dressing every other day. Follow-up Appointments: Wound #3 Left,Dorsal Foot: Return Appointment in 1 week. Wound #4 Right,Lateral Malleolus: Return Appointment in 1 week. Additional Orders / Instructions: Wound #3 Left,Dorsal Foot: Vitamin A; Vitamin C, Zinc - Please add a multivitamin with 100% of these vitamins and minerals in it Increase protein intake. Activity as tolerated Wound #4 Right,Lateral Malleolus: Vitamin A; Vitamin C, Zinc - Please add a multivitamin with 100% of these vitamins and minerals in it Increase protein intake. Activity as tolerated I am going to suggest at this point that we continue with the Current wound care measures since the patient seems to be making progress. We will keep an eye on the right lateral ankle ulcer since this seems to be likely healed. If anything changes he will let us know. Please see above for specific wound care orders. We will see patient for re-evaluation in 1 week(s) here in the clinic. If anything worsens or changes patient will contact our office for additional recommendations. Electronic Signature(s) Signed: 07/24/2017 12:59:06 AM By: Worthy Keeler [name] Entered By: Worthy Keeler on 07/23/2017 23:48:02 [name] ([id number]) -------------------------------------------------------------------------------- ROS/PFSH Details Patient Name: [name] Date of Service: 07/22/2017 2:45 PM Medical Record Number: [id number] Patient Account Number: [id number] Date of Birth/Sex: 1945-11-23 (71 y.o. M) Treating RN: Montey Hora Primary Care Provider: [name] Other Clinician: Referring Provider: [name] Treating Provider/Extender: Melburn Hake,  Weeks in Treatment: 4 Information Obtained From Patient Wound History Do  you currently have one or more open woundso Yes How many open wounds do you currently haveo 2 Approximately how long have you had your woundso 2 months How have you been treating your wound(s) until nowo triple antibiotic cream Has your wound(s) ever healed and then  re-openedo No Have you had any lab work done in the past montho Yes Who ordered the lab work Ambulance person at [place] Have you tested positive for an antibiotic resistant organism (MRSA, VRE)o No Have you tested positive for osteomyelitis (bone infection)o No Have you had any tests for circulation on your legso No Constitutional Symptoms (General Health) Complaints and Symptoms: Negative for: Fever; Chills Eyes Medical History: Positive for: Cataracts - 2018 Ear/Nose/Mouth/Throat Medical History: Positive for: Middle ear problems Negative for: Chronic sinus problems/congestion Hematologic/Lymphatic Medical History: Negative for: Anemia; Hemophilia; Human Immunodeficiency Virus; Lymphedema; Sickle Cell Disease Respiratory Complaints and Symptoms: No Complaints or Symptoms Medical History: Positive for: Sleep Apnea Negative for: Aspiration; Asthma; Chronic Obstructive Pulmonary Disease (COPD); Pneumothorax; Tuberculosis Cardiovascular Complaints and Symptoms: No Complaints or Symptoms Medical History: [name], [name] ([id number]) Positive for: Hypertension Negative for: Angina; Arrhythmia; Congestive Heart Failure; Coronary Artery Disease; Deep Vein Thrombosis; Hypotension; Myocardial Infarction; Peripheral Arterial Disease; Peripheral Venous Disease; Phlebitis; Vasculitis Past Medical History Notes: CVA in 2015 Gastrointestinal Medical History: Negative for: Cirrhosis ; Colitis; Crohnos; Hepatitis A; Hepatitis B; Hepatitis C Endocrine Medical History: Positive for: Type II Diabetes Treated with: Insulin Blood sugar tested every day: Yes Tested : TID Blood sugar testing results: Bedtime:  130 Genitourinary Medical History: Negative for: End Stage Renal Disease Past Medical History Notes: "weak kidneys" - is a patient at [facility] History: Negative for: Lupus Erythematosus; Raynaudos; Scleroderma Musculoskeletal Medical History: Positive for: Gout - history; Osteoarthritis Negative for: Rheumatoid Arthritis; Osteomyelitis Neurologic Medical History: Positive for: Neuropathy Negative for: Dementia; Quadriplegia; Paraplegia; Seizure Disorder Oncologic Medical History: Negative for: Received Chemotherapy; Received Radiation Psychiatric Complaints and Symptoms: No Complaints or Symptoms HBO Extended History Items Eyes: Ear/Nose/Mouth/Throat: Cataracts Middle ear problems CORDARRO, [name] ([id number]) Immunizations Pneumococcal Vaccine: Received Pneumococcal Vaccination: Yes Immunization Notes: up to date Implantable Devices Family and Social History Cancer: Yes - Siblings; Diabetes: Yes - Father; Heart Disease: No; Hypertension: Yes - Father; Kidney Disease: Yes - Mother; Lung Disease: No; Stroke: Yes - Father; Thyroid Problems: No; Tuberculosis: No; Former smoker; Marital Status - Widowed; Alcohol Use: Never; Drug Use: No History; Caffeine Use: Moderate; Financial Concerns: No; Food, Clothing or Shelter Needs: No; Support System Lacking: No; Transportation Concerns: No; Advanced Directives: No; Patient does not want information on Advanced Directives Physician Affirmation I have reviewed and agree with the above information. Electronic Signature(s) Signed: 07/24/2017 12:59:06 AM By: Worthy Keeler [name] Signed: 07/25/2017 4:05:33 PM By: Montey Hora Entered By: Worthy Keeler on 07/23/2017 23:47:01 [name] ([id number]) -------------------------------------------------------------------------------- SuperBill Details Patient Name: [name] Date of Service: 07/22/2017 Medical Record Number: [id number] Patient  Account Number: [id number] Date of Birth/Sex: 05-23-1945 (71 y.o. 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 L03.031 Cellulitis of right toe N18.3 Chronic kidney disease, stage 3 (moderate) I10 Essential (primary) hypertension E11.40 Type 2 diabetes mellitus with diabetic neuropathy, unspecified Facility Procedures CPT4 Code: 94496759 Description: 99213 - WOUND CARE VISIT-LEV 3 EST PT Modifier: Quantity: 1 Physician Procedures CPT4 Code: 1638466 Description: 59935 - WC PHYS LEVEL 3 - EST PT ICD-10 Diagnosis Description E11.621 Type 2 diabetes mellitus with foot ulcer L97.522  Non-pressure chronic ulcer of other part of left foot with fa L97.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 with fat  layer expo L03.031 Cellulitis of right toe Modifier: t layer exposed sed Quantity: 1 Electronic Signature(s) Signed: 07/24/2017 12:59:06 AM By: Worthy Keeler [name] Entered By: Worthy Keeler on 07/23/2017 23:48:21

## 2017-08-01 ENCOUNTER — Ambulatory Visit: Payer: Self-pay

## 2017-08-01 ENCOUNTER — Ambulatory Visit: Payer: Medicare Other | Admitting: Physician Assistant

## 2017-08-01 NOTE — Telephone Encounter (Signed)
Pt calling c/o anxiety and trouble sleeping. Pt stated "I feel like my life has fallen apart." Pt denies suicidal or homicidal ideation.  Pt stated that he is dealing with a lot of grief and having more difficulties in his life. Pt has been feeling nauseated and was sick all night. He was having vomiting and diarrhea and feels weak. Pt states he feels better today but the nausea remains. Pt is not having any signs of dehydration at this time. Pt ate lunch and is drinking Gatorade and water.  [employer] and she gave permission to use a same day appt for this Wednesday at [place] with his PCP.  Pt given the phone number for [facility] as well. Offered to warm transfer pt to number but pt refused.  Emotional support provided.  Reason for Disposition . [1] Symptoms of anxiety or panic AND [2] has not been evaluated for this by physician . Recent traumatic event (e.g., death of a loved one, job loss, victim/witness of crime)  Answer Assessment - Initial Assessment Questions 1. CONCERN: "What happened that made you call today?"     Having anxiety 2. ANXIETY SYMPTOM SCREENING: "Can you describe how you have been feeling?"  (e.g., tense, restless, panicky, anxious, keyed up, trouble sleeping, trouble concentrating)     Tense restless panicky anxious keyed up trouble sleeping trouble concentrating 3. ONSET: "How long have you been feeling this way?"     Since his wife passed away in 05-04-2022- married for 43 years 4. RECURRENT: "Have you felt this way before?"  If yes: "What happened that time?" "What helped these feelings go away in the past?"      no 5. RISK OF HARM - SUICIDAL IDEATION:  "Do you ever have thoughts of hurting or killing yourself?"  (e.g., yes, no, no but preoccupation with thoughts about death)   - INTENT:  "Do you have thoughts of hurting or killing yourself right NOW?" (e.g., yes, no, N/A)   - PLAN: "Do you have a specific plan for how you would do this?" (e.g., gun, knife,  overdose, no plan, N/A)     no 6. RISK OF HARM - HOMICIDAL IDEATION:  "Do you ever have thoughts of hurting or killing someone else?"  (e.g., yes, no, no but preoccupation with thoughts about death)   - INTENT:  "Do you have thoughts of hurting or killing someone right NOW?" (e.g., yes, no, N/A)   - PLAN: "Do you have a specific plan for how you would do this?" (e.g., gun, knife, no plan, N/A)      no 7. FUNCTIONAL IMPAIRMENT: "How have things been going for you overall in your life? Have you had any more difficulties than usual doing your normal daily activities?"  (e.g., better, same, worse; self-care, school, work, interactions)     Dealing with a lot of grief- having more difficulties doing daily activities 8. SUPPORT: "Who is with you now?" "Who do you live with?" "Do you have family or friends nearby who you can talk to?"      [place] grandson, Mother (82 years old with Alzheimers), grandaughter- His grandaughter and great grandson lives behind him 49. THERAPIST: "Do you have a counselor or therapist? Name?"     no 10. STRESSORS: "Has there been any new stress or recent changes in your life?"       Wife passed away 47. CAFFEINE ABUSE: "Do you drink caffeinated beverages, and how much each day?" (e.g., coffee, tea, colas)  Yes drink 2 per day, tea 12. SUBSTANCE ABUSE: "Do you use any illegal drugs or alcohol?"       no 13. OTHER SYMPTOMS: "Do you have any other physical symptoms right now?" (e.g., chest pain, palpitations, difficulty breathing, fever)       Nausea, "feels like my whole world has vanished" 14. PREGNANCY: "Is there any chance you are pregnant?" "When was your last menstrual period?"       n/a  Protocols used: ANXIETY AND PANIC ATTACK-A-AH

## 2017-08-03 ENCOUNTER — Ambulatory Visit (INDEPENDENT_AMBULATORY_CARE_PROVIDER_SITE_OTHER): Payer: Medicare Other | Admitting: Family Medicine

## 2017-08-03 ENCOUNTER — Other Ambulatory Visit: Payer: Self-pay

## 2017-08-03 ENCOUNTER — Encounter: Payer: Self-pay | Admitting: Family Medicine

## 2017-08-03 VITALS — BP 110/68 | HR 72 | Temp 97.6°F | Ht 71.25 in | Wt 224.5 lb

## 2017-08-03 DIAGNOSIS — F419 Anxiety disorder, unspecified: Secondary | ICD-10-CM | POA: Diagnosis not present

## 2017-08-03 DIAGNOSIS — F4321 Adjustment disorder with depressed mood: Secondary | ICD-10-CM

## 2017-08-03 DIAGNOSIS — F3341 Major depressive disorder, recurrent, in partial remission: Secondary | ICD-10-CM | POA: Diagnosis not present

## 2017-08-03 DIAGNOSIS — Z471 Aftercare following joint replacement surgery: Secondary | ICD-10-CM | POA: Diagnosis not present

## 2017-08-03 DIAGNOSIS — M25551 Pain in right hip: Secondary | ICD-10-CM | POA: Diagnosis not present

## 2017-08-03 DIAGNOSIS — M1611 Unilateral primary osteoarthritis, right hip: Secondary | ICD-10-CM | POA: Diagnosis not present

## 2017-08-03 DIAGNOSIS — Z96641 Presence of right artificial hip joint: Secondary | ICD-10-CM | POA: Diagnosis not present

## 2017-08-03 MED ORDER — LISINOPRIL-HYDROCHLOROTHIAZIDE 20-12.5 MG PO TABS: 1.0000 | ORAL_TABLET | Freq: Every day | ORAL | 1 refills | Status: DC

## 2017-08-03 MED ORDER — DOXAZOSIN MESYLATE 8 MG PO TABS: 4.0000 mg | ORAL_TABLET | Freq: Every day | ORAL | 1 refills | Status: DC

## 2017-08-03 NOTE — Progress Notes (Signed)
   Dr. Frederico Hamman T. Annslee Tercero, MD, [facility] [place], [address] Phone: [phone] Fax: [phone]  08/03/2017  Patient: [name], [name]: [id number], DOB: August 09, 1945, 72 y.o.  Primary Physician:  [name], MD   Chief Complaint  Patient presents with  . Depression  . Anxiety   Subjective:   [name] is a 72 y.o. very pleasant male patient who presents with the following:  Grief reaction  Major depressive disorder, recurrent episode, in partial remission ([place])  Acute anxiety   >25 minutes spent in face to face time with patient, >50% spent in counselling or coordination of care   Wife [name] has recently died.   Currently taking Wellbutrin SR 150 mg bid Taking Effexor 225 mg, 75 mg in the morning and 150 mg at night  [place].  Trouble going to sleep and tossing and turning.  Going to sleep in chair. No energy.  Some friends.  No SI or HI.  No guilt.  Mildly tearful in the office.   We talked about options, and he is open to going to grief counselling through hospice or other counselling. Given numbers for this. He is going to call me with any changes.  Signed,  Maud Deed. Makell Cyr, MD

## 2017-08-06 DIAGNOSIS — M25532 Pain in left wrist: Secondary | ICD-10-CM | POA: Diagnosis not present

## 2017-08-11 ENCOUNTER — Ambulatory Visit (INDEPENDENT_AMBULATORY_CARE_PROVIDER_SITE_OTHER): Payer: Medicare Other

## 2017-08-11 DIAGNOSIS — E349 Endocrine disorder, unspecified: Secondary | ICD-10-CM | POA: Diagnosis not present

## 2017-08-11 MED ORDER — TESTOSTERONE CYPIONATE 200 MG/ML IM SOLN
150.0000 mg | Freq: Once | INTRAMUSCULAR | Status: AC
  Administered 2017-08-11: 150 mg via INTRAMUSCULAR

## 2017-08-12 ENCOUNTER — Encounter: Payer: Medicare Other | Attending: Physician Assistant | Admitting: Physician Assistant

## 2017-08-12 DIAGNOSIS — Z8673 Personal history of transient ischemic attack (TIA), and cerebral infarction without residual deficits: Secondary | ICD-10-CM | POA: Insufficient documentation

## 2017-08-12 DIAGNOSIS — Z8631 Personal history of diabetic foot ulcer: Secondary | ICD-10-CM | POA: Insufficient documentation

## 2017-08-12 DIAGNOSIS — E1140 Type 2 diabetes mellitus with diabetic neuropathy, unspecified: Secondary | ICD-10-CM | POA: Diagnosis not present

## 2017-08-12 DIAGNOSIS — L97522 Non-pressure chronic ulcer of other part of left foot with fat layer exposed: Secondary | ICD-10-CM | POA: Diagnosis not present

## 2017-08-12 DIAGNOSIS — Z794 Long term (current) use of insulin: Secondary | ICD-10-CM | POA: Insufficient documentation

## 2017-08-12 DIAGNOSIS — G4730 Sleep apnea, unspecified: Secondary | ICD-10-CM | POA: Diagnosis not present

## 2017-08-12 DIAGNOSIS — Z09 Encounter for follow-up examination after completed treatment for conditions other than malignant neoplasm: Secondary | ICD-10-CM | POA: Insufficient documentation

## 2017-08-12 DIAGNOSIS — E1122 Type 2 diabetes mellitus with diabetic chronic kidney disease: Secondary | ICD-10-CM | POA: Insufficient documentation

## 2017-08-12 DIAGNOSIS — Z87891 Personal history of nicotine dependence: Secondary | ICD-10-CM | POA: Insufficient documentation

## 2017-08-12 DIAGNOSIS — I129 Hypertensive chronic kidney disease with stage 1 through stage 4 chronic kidney disease, or unspecified chronic kidney disease: Secondary | ICD-10-CM | POA: Diagnosis not present

## 2017-08-12 DIAGNOSIS — N183 Chronic kidney disease, stage 3 (moderate): Secondary | ICD-10-CM | POA: Diagnosis not present

## 2017-08-12 DIAGNOSIS — L97319 Non-pressure chronic ulcer of right ankle with unspecified severity: Secondary | ICD-10-CM | POA: Diagnosis not present

## 2017-08-15 ENCOUNTER — Telehealth: Payer: Self-pay | Admitting: Family Medicine

## 2017-08-15 DIAGNOSIS — R197 Diarrhea, unspecified: Secondary | ICD-10-CM

## 2017-08-15 NOTE — Telephone Encounter (Signed)
I need to know more information - how long has this been going on? If urgent they won't be able to see him on an ASAP basis. Please get a better history.

## 2017-08-15 NOTE — Telephone Encounter (Signed)
Spoke with [name].  He states he never feels like he needs for have a BM and when he does it is diarrhea.  He does not feel like he is constipated.  He states he has been belching a lot and it leaves a bad taste in his mouth.  He has also been vomiting a lot.  These symptoms have been going on for about 3 weeks.  Per Dr. Lorelei Pont okay to refer to GI doctor.  Patient prefers [place].  Referral Entered in Epic.

## 2017-08-15 NOTE — Telephone Encounter (Signed)
Copied from [place] [id number]. Topic: Quick Communication - See Telephone Encounter >> Aug 15, 2017 12:01 PM Robina Ade, Helene Kelp D wrote: CRM for notification. See Telephone encounter for: 08/15/17. Patient called and would like to talk to Dr. Lorelei Pont about an issue he is having with his stomach. Patient is constipated and some vomiting and would like to see if he can be referral somewhere else like GI in [place].

## 2017-08-16 NOTE — Telephone Encounter (Signed)
Patient contacted by [facility] and appt has been scheduled.

## 2017-08-18 NOTE — Progress Notes (Signed)
[name] ([id number]) Visit Report for 08/12/2017 Chief Complaint Document Details Patient Name: [name] Date of Service: 08/12/2017 11:00 AM Medical Record Number: [id number] Patient Account Number: [id number] Date of Birth/Sex: 1945-12-16 (72 y.o. M) Treating RN: Montey Hora Primary Care Provider: [name] Other Clinician: Referring Provider: [name] Treating Provider/Extender: Melburn Hake, Adalbert Alberto Weeks in Treatment: 7 Information Obtained from: Patient Chief Complaint Right lateral Malleolus ulcer and left dorsal foot ulcer Electronic Signature(s) Signed: 08/16/2017 5:56:33 PM By: Worthy Keeler [name] Entered By: Worthy Keeler on 08/12/2017 11:03:28 [name] ([id number]) -------------------------------------------------------------------------------- HPI Details Patient Name: [name] Date of Service: 08/12/2017 11:00 AM Medical Record Number: [id number] Patient Account Number: [id number] Date of Birth/Sex: [birth year] (71 y.o. M) Treating RN: Montey Hora Primary Care Provider: [name] Other Clinician: Referring Provider: [name] Treating Provider/Extender: Melburn Hake, Khamarion Bjelland Weeks in Treatment: 7 History of Present Illness HPI Description: 10/04/16 on evaluation today patient presents for initial evaluation concerning a laceration of the right wrist and hand on the bowler aspect. He tells me that he fell into glass following a medication change in regard to his diabetes. He initially went to [facility] where she was sutured and subsequently his daughter who is a Marine scientist removed the teachers at the appropriate time. Unfortunately the wound has done poorly and dehissed which has caused him some trouble including infection. He has been on antibiotics both topically and orally though the wound continues to show signs of necrotic tissue according to notes which is what he was sent to [place] for evaluation concerning. He does  have diabetes and unfortunately this is severely uncontrolled. He does see an endocrinologist and is working on this. 10/11/2016 -- the patient's notes from the ER were reviewed and he had his suturing done on 09/17/2016 and had necrotic skin flaps which had to be trimmed the last visit he was here. He has managed to get his Santyl ointment and has been using this appropriately. 10/21/16 patient's wound appears to be doing very well on evaluation today. He still has some Slough covering the wound bed but this is nowhere near as severe as when i saw this initially two weeks ago nor even my review of his pictures last week. I'm pleased with how this is progressing. Patient's wound over the right form appears to be doing fairly well on evaluation today. There is no evidence of infection and he has a small area at the crease of where she is wrist extends and flexes that is still open and appears to be having difficulty due to the fact that it is cracking open. I think this is something that may be controlled with moisture control that is keeping the wound a little bit more moist. Nonetheless this is a tiny region compared to the initial wound and overall appears to be doing very well. No fevers, chills, nausea, or vomiting noted at this time. 11/25/16 on evaluation today patient appears to be doing well in regard to his right wrist ulcer. In fact this appears to be completely healed he is having no discomfort. He is pleased with how this is progressed. Readmission: 06/24/17 on evaluation today patient appears back in our clinic regarding issues that he has been having with his right ankle on the lateral malleolus as well is in the dorsal surface of his left foot. Both have been going on for several weeks unfortunately. He tells me that initially this was being managed by Dr. Edilia Bo and  I did review the notes from Dr. Edilia Bo in epic. Patient was placed on amoxicillin initially and then subsequently  Keflex following. He has been using triple antibiotic ointment over-the-counter along with a Band-Aid for the wound currently. He did not have any Santyl remaining. His most recent hemoglobin A1c which was just two days ago was 10.3. Fortunately he seems to have excellent blood flow in the bilateral lower extremities with his left ABI being 0.95 in the right ABI 1.01. Patient does have some pain in regard to each of these ulcerations but he tells me it's not nearly as severe as the hand wound that I help treat earlier over the summer. This was in 2018. 07/15/17 on evaluation today patient's lower surety ulcer is actually both appear to be doing fairly well at this point. He does have a little bit of skin overlapping the ulcer on the right lateral ankle this was debrided away today just with saline and gauze without complication patient had no discomfort or pain. Otherwise patient's left foot ulcer does appear to be showing signs of improvement he does seem to have a right great toe. Nikki and noted at this point in time. I think that this does need to be managed at this point. No fevers, chills, nausea, or vomiting noted at this time. 07/22/17 on evaluation today patient's wounds in regard to his lower extremities appear to be doing somewhat better. His right ankle wound in fact appears to likely be completely closed. The left dorsal foot ulcer though still open does appear to be a little bit smaller and does also seem to be making good progress. He has not been having problems with the treatment at this point in the general he does not seem to show as much erythema surrounding the wound bed which is good news. He is not [name], [name] ([id number]) having significant discomfort. 08/12/17 on evaluation today patients wounds of both appeared to be doing better in fact they both appear to be completely healed. Overall I'm very pleased with how things have progressed over the past week even more than what  I expected. Obviously this is good news and he is happy about this. He did have a fall when going to his orthopedic doctor recently fortunately he does not appear to have injured anything severely but nonetheless does have a couple of bumps and bruises nothing significant at this time but he needs to be seen and wound care for. He may have broken his left wrist he is following up with orthopedics in that regard. Electronic Signature(s) Signed: 08/16/2017 5:56:33 PM By: Worthy Keeler [name] Entered By: Worthy Keeler on 08/12/2017 13:11:25 [name] ([id number]) -------------------------------------------------------------------------------- Physical Exam Details Patient Name: [name] Date of Service: 08/12/2017 11:00 AM Medical Record Number: [id number] Patient Account Number: [id number] Date of Birth/Sex: 06/10/1945 (71 y.o. M) Treating RN: Montey Hora Primary Care Provider: [name] Other Clinician: Referring Provider: [name] Treating Provider/Extender: [name], Jacqui Headen Weeks in Treatment: 7 Constitutional Well-nourished and well-hydrated in no acute distress. Respiratory normal breathing without difficulty. Psychiatric this patient is able to make decisions and demonstrates good insight into disease process. Alert and Oriented x 3. pleasant and cooperative. Notes Patients words appear to be completely sealed there is no evidence of infection and overall I am pleased with how things appear today. The patient likewise was surprised both areas were completely closed. Electronic Signature(s) Signed: 08/16/2017 5:56:33 PM By: Worthy Keeler [name] Entered By: Melburn Hake,  Thurston Brendlinger on 08/12/2017 13:12:07 [name], [name] ([id number]) -------------------------------------------------------------------------------- Physician Orders Details Patient Name: [name] Date of Service: 08/12/2017 11:00 AM Medical Record Number: [id number] Patient Account  Number: [id number] Date of Birth/Sex: December 19, 1945 (71 y.o. 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 ([id number]) -------------------------------------------------------------------------------- Problem List Details Patient Name: [name] Date of Service: 08/12/2017 11:00 AM Medical Record Number: [id number] Patient Account Number: [id number] Date of Birth/Sex: October 08, 1945 (71 y.o. M) Treating RN: Montey Hora Primary Care Provider: [name] Other Clinician: Referring Provider: [name] Treating Provider/Extender: Melburn Hake, Gudrun Axe Weeks in Treatment: 7 Active Problems ICD-10 Impacting Encounter Code Description Active Date Wound Healing Diagnosis E11.621 Type 2 diabetes mellitus with foot ulcer 06/24/2017 Yes L97.522 Non-pressure chronic ulcer of other part of left foot with fat 06/24/2017 Yes layer exposed L97.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 with fat layer 06/24/2017 Yes exposed L03.031 Cellulitis of right toe 07/15/2017 Yes N18.3 Chronic kidney disease, stage 3 (moderate) 06/24/2017  Yes I10 Essential (primary) hypertension 06/24/2017 Yes E11.40 Type 2 diabetes mellitus with diabetic neuropathy, 06/24/2017 Yes unspecified Inactive Problems Resolved Problems Electronic Signature(s) Signed: 08/16/2017 5:56:33 PM By: Worthy Keeler [name] Entered By: Worthy Keeler on 08/12/2017 11:03:21 [name] ([id number]) -------------------------------------------------------------------------------- Progress Note Details Patient Name: [name] Date of Service: 08/12/2017 11:00 AM Medical Record Number: [id number] Patient Account Number: [id number] Date of Birth/Sex: May 16, 1945 (71 y.o. M) Treating RN: Montey Hora Primary Care Provider: [name] Other Clinician: Referring Provider: [name] Treating Provider/Extender: Melburn Hake, Cameo Schmiesing Weeks in Treatment: 7 Subjective Chief Complaint Information obtained from Patient Right lateral Malleolus ulcer and left dorsal foot ulcer History of Present Illness (HPI) 10/04/16 on evaluation today patient presents for initial evaluation concerning a laceration of the right wrist and hand on the bowler aspect. He tells me that he fell into glass following a medication change in regard to his diabetes. He initially went to [facility] where she was sutured and subsequently his daughter who is a Marine scientist removed the teachers at the appropriate time. Unfortunately the wound has done poorly and dehissed which has caused him some trouble including infection. He has been on antibiotics both topically and orally though the wound continues to show signs of necrotic tissue according to notes which is what he was sent to [place] for evaluation concerning. He does have diabetes and unfortunately this is severely uncontrolled. He does see an endocrinologist and is working on this. 10/11/2016 -- the patient's notes from the ER were reviewed and he had his suturing done on 09/17/2016 and had necrotic skin flaps which had to be trimmed  the last visit he was here. He has managed to get his Santyl ointment and has been using this appropriately. 10/21/16 patient's wound appears to  be doing very well on evaluation today. He still has some Slough covering the wound bed but this is nowhere near as severe as when i saw this initially two weeks ago nor even my review of his pictures last week. I'm pleased with how this is progressing. Patient's wound over the right form appears to be doing fairly well on evaluation today. There is no evidence of infection and he has a small area at the crease of where she is wrist extends and flexes that is still open and appears to be having difficulty due to the fact that it is cracking open. I think this is something that may be controlled with moisture control that is keeping the wound a little bit more moist. Nonetheless this is a tiny region compared to the initial wound and overall appears to be doing very well. No fevers, chills, nausea, or vomiting noted at this time. 11/25/16 on evaluation today patient appears to be doing well in regard to his right wrist ulcer. In fact this appears to be completely healed he is having no discomfort. He is pleased with how this is progressed. Readmission: 06/24/17 on evaluation today patient appears back in our clinic regarding issues that he has been having with his right ankle on the lateral malleolus as well is in the dorsal surface of his left foot. Both have been going on for several weeks unfortunately. He tells me that initially this was being managed by Dr. Edilia Bo and I did review the notes from Dr. Edilia Bo in epic. Patient was placed on amoxicillin initially and then subsequently Keflex following. He has been using triple antibiotic ointment over-the-counter along with a Band-Aid for the wound currently. He did not have any Santyl remaining. His most recent hemoglobin A1c which was just two days ago was 10.3. Fortunately he seems to have excellent  blood flow in the bilateral lower extremities with his left ABI being 0.95 in the right ABI 1.01. Patient does have some pain in regard to each of these ulcerations but he tells me it's not nearly as severe as the hand wound that I help treat earlier over the summer. This was in 2018. 07/15/17 on evaluation today patient's lower surety ulcer is actually both appear to be doing fairly well at this point. He does have a little bit of skin overlapping the ulcer on the right lateral ankle this was debrided away today just with saline and gauze without complication patient had no discomfort or pain. Otherwise patient's left foot ulcer does appear to be showing signs of improvement he does seem to have a right great toe. Nikki and noted at this point in time. I think that this does need to be [name], [name]. ([id number]) managed at this point. No fevers, chills, nausea, or vomiting noted at this time. 07/22/17 on evaluation today patient's wounds in regard to his lower extremities appear to be doing somewhat better. His right ankle wound in fact appears to likely be completely closed. The left dorsal foot ulcer though still open does appear to be a little bit smaller and does also seem to be making good progress. He has not been having problems with the treatment at this point in the general he does not seem to show as much erythema surrounding the wound bed which is good news. He is not having significant discomfort. 08/12/17 on evaluation today patients wounds of both appeared to be doing better in fact they both appear to be completely healed. Overall I'm very  pleased with how things have progressed over the past week even more than what I expected. Obviously this is good news and he is happy about this. He did have a fall when going to his orthopedic doctor recently fortunately he does not appear to have injured anything severely but nonetheless does have a couple of bumps and bruises nothing  significant at this time but he needs to be seen and wound care for. He may have broken his left wrist he is following up with orthopedics in that regard. Patient History Information obtained from Patient. Family History Cancer - Siblings, Diabetes - Father, Hypertension - Father, Kidney Disease - Mother, Stroke - Father, No family history of Heart Disease, Lung Disease, Thyroid Problems, Tuberculosis. Social History Former smoker, Marital Status - Widowed, Alcohol Use - Never, Drug Use - No History, Caffeine Use - Moderate. Medical And Surgical History Notes Cardiovascular CVA in 2015 Genitourinary "weak kidneys" - is a patient at [facility] ([facility]) Constitutional Symptoms (General Health) Denies complaints or symptoms of Fever, Chills. Respiratory The patient has no complaints or symptoms. Psychiatric The patient has no complaints or symptoms. Objective Constitutional Well-nourished and well-hydrated in no acute distress. Vitals Time Taken: 11:06 AM, Height: 71 in, Weight: 232 lbs, BMI: 32.4, Temperature: 97.7 F, Pulse: 72 bpm, Respiratory Rate: 18 breaths/min, Blood Pressure: 148/86 mmHg. Respiratory normal breathing without difficulty. [name], [name] ([id number]) Psychiatric this patient is able to make decisions and demonstrates good insight into disease process. Alert and Oriented x 3. pleasant and cooperative. General Notes: Patients words appear to be completely sealed there is no evidence of infection and overall I am pleased with how things appear today. The patient likewise was surprised both areas were completely closed. Integumentary (Hair, Skin) Wound #3 status is Healed - Epithelialized. Original cause of wound was Gradually Appeared. The wound is located on the Left,Dorsal Foot. The wound measures 0cm length x 0cm width x 0cm depth; 0cm^2 area and 0cm^3 volume. There is Fat Layer (Subcutaneous Tissue) Exposed exposed. There is no tunneling  or undermining noted. There is a none present amount of drainage noted. The wound margin is flat and intact. There is medium (34-66%) red granulation within the wound bed. There is a medium (34-66%) amount of necrotic tissue within the wound bed including Adherent Slough. The periwound skin appearance exhibited: Maceration, Erythema. The periwound skin appearance did not exhibit: Callus, Crepitus, Excoriation, Induration, Rash, Scarring, Dry/Scaly, Atrophie Blanche, Cyanosis, Ecchymosis, Hemosiderin Staining, Mottled, Pallor, Rubor. The surrounding wound skin color is noted with erythema with red streaks. Periwound temperature was noted as No Abnormality. The periwound has tenderness on palpation. Wound #4 status is Healed - Epithelialized. Original cause of wound was Gradually Appeared. The wound is located on the Right,Lateral Malleolus. The wound measures 0cm length x 0cm width x 0cm depth; 0cm^2 area and 0cm^3 volume. There is no tunneling or undermining noted. There is a none present amount of drainage noted. The wound margin is flat and intact. There is no granulation within the wound bed. There is a large (67-100%) amount of necrotic tissue within the wound bed including Eschar. The periwound skin appearance exhibited: Erythema. The periwound skin appearance did not exhibit: Callus, Crepitus, Excoriation, Induration, Rash, Scarring, Dry/Scaly, Maceration, Atrophie Blanche, Cyanosis, Ecchymosis, Hemosiderin Staining, Mottled, Pallor, Rubor. The surrounding wound skin color is noted with erythema. Periwound temperature was noted as No Abnormality. The periwound has tenderness on palpation. Assessment Active Problems ICD-10 E11.621 - Type 2 diabetes mellitus  with foot ulcer L97.522 - Non-pressure chronic ulcer of other part of left foot with fat layer exposed L97.312 - Non-pressure chronic ulcer of right ankle with fat layer exposed L03.031 - Cellulitis of right toe N18.3 - Chronic kidney  disease, stage 3 (moderate) I10 - Essential (primary) hypertension E11.40 - Type 2 diabetes mellitus with diabetic neuropathy, unspecified Plan Discharge From [facility]: Discharge from [place], [place] ([id number]) I am going to recommend currently that we discontinue Current wound care measures. I do want him to continue to use a bandage over these areas in order to protect from any additional injury for the next several weeks. He's agreed with this plan. I did actually recommend Band-Aids that are somewhat more gentle on the skin for him in order to avoid any irritation as he become severely irritated with any bandages that he puts on for the most part. He's in agreement with this plan. We'll see you back in the future as needed if anything changes or worsens otherwise we will discontinue winter services at this point. Electronic Signature(s) Signed: 08/16/2017 5:56:33 PM By: Worthy Keeler [name] Entered By: Worthy Keeler on 08/12/2017 13:12:58 [name] ([id number]) -------------------------------------------------------------------------------- ROS/PFSH Details Patient Name: [name] Date of Service: 08/12/2017 11:00 AM Medical Record Number: [id number] Patient Account Number: [id number] Date of Birth/Sex: 1946/02/17 (71 y.o. M) Treating RN: Montey Hora Primary Care Provider: [name] Other Clinician: Referring Provider: [name] Treating Provider/Extender: Melburn Hake, Kahlen Boyde Weeks in Treatment: 7 Information Obtained From Patient Wound History Do you currently have one or more open woundso Yes How many open wounds do you currently haveo 2 Approximately how long have you had your woundso 2 months How have you been treating your wound(s) until nowo triple antibiotic cream Has your wound(s) ever healed and then re-openedo No Have you had any lab work done in the past montho Yes Who ordered the lab work Ambulance person at [facility] Have you tested positive for an antibiotic resistant organism (MRSA, VRE)o No Have you tested positive for osteomyelitis (bone infection)o No Have you had any tests for circulation on your legso No Constitutional Symptoms (General Health) Complaints and Symptoms: Negative for: Fever; Chills Eyes Medical History: Positive for: Cataracts - 2018 Ear/Nose/Mouth/Throat Medical History: Positive for: Middle ear problems Negative for: Chronic sinus problems/congestion Hematologic/Lymphatic Medical History: Negative for: Anemia; Hemophilia; Human Immunodeficiency Virus; Lymphedema; Sickle Cell Disease Respiratory Complaints and Symptoms: No Complaints or Symptoms Medical History: Positive for: Sleep Apnea Negative for: Aspiration; Asthma; Chronic Obstructive Pulmonary Disease (COPD); Pneumothorax; Tuberculosis Cardiovascular Medical History: Positive for: Hypertension Negative for: Angina; Arrhythmia; Congestive Heart Failure; Coronary Artery Disease; Deep Vein Thrombosis; Hypotension; Myocardial Infarction; Peripheral Arterial Disease; Peripheral Venous Disease; Phlebitis; Vasculitis [name], [name] ([id number]) Past Medical History Notes: CVA in 2015 Gastrointestinal Medical History: Negative for: Cirrhosis ; Colitis; Crohnos; Hepatitis A; Hepatitis B; Hepatitis C Endocrine Medical History: Positive for: Type II Diabetes Treated with: Insulin Blood sugar tested every day: Yes Tested : TID Blood sugar testing results: Bedtime: 130 Genitourinary Medical History: Negative for: End Stage Renal Disease Past Medical History Notes: "weak kidneys" - is a patient at [facility] History: Negative for: Lupus Erythematosus; Raynaudos; Scleroderma Musculoskeletal Medical History: Positive for: Gout - history; Osteoarthritis Negative for: Rheumatoid Arthritis; Osteomyelitis Neurologic Medical History: Positive for: Neuropathy Negative for:  Dementia; Quadriplegia; Paraplegia; Seizure Disorder Oncologic Medical History: Negative for: Received Chemotherapy; Received Radiation Psychiatric Complaints and Symptoms: No Complaints or Symptoms  HBO Extended History Items Eyes: Ear/Nose/Mouth/Throat: Cataracts Middle ear problems Immunizations Pneumococcal Vaccine: Received Pneumococcal Vaccination: Yes [name], [name] ([id number]) Immunization Notes: up to date Implantable Devices Family and Social History Cancer: Yes - Siblings; Diabetes: Yes - Father; Heart Disease: No; Hypertension: Yes - Father; Kidney Disease: Yes - Mother; Lung Disease: No; Stroke: Yes - Father; Thyroid Problems: No; Tuberculosis: No; Former smoker; Marital Status - Widowed; Alcohol Use: Never; Drug Use: No History; Caffeine Use: Moderate; Financial Concerns: No; Food, Clothing or Shelter Needs: No; Support System Lacking: No; Transportation Concerns: No; Advanced Directives: No; Patient does not want information on Advanced Directives Physician Affirmation I have reviewed and agree with the above information. Electronic Signature(s) Signed: 08/12/2017 4:48:08 PM By: Montey Hora Signed: 08/16/2017 5:56:33 PM By: Worthy Keeler [name] Entered By: Worthy Keeler on 08/12/2017 13:11:44 [name] ([id number]) -------------------------------------------------------------------------------- SuperBill Details Patient Name: [name] Date of Service: 08/12/2017 Medical Record Number: [id number] Patient Account Number: [id number] Date of Birth/Sex: 1945-10-26 (71 y.o. M) Treating RN: Montey Hora Primary Care Provider: [name] Other Clinician: Referring Provider: [name] Treating Provider/Extender: Melburn Hake, Rikia Sukhu Weeks in Treatment: 7 Diagnosis Coding ICD-10 Codes Code Description E11.621 Type 2 diabetes mellitus with foot ulcer L97.522 Non-pressure chronic ulcer of other part of left foot with fat layer exposed L97.312  Non-pressure chronic ulcer of right ankle with fat layer exposed L03.031 Cellulitis of right toe N18.3 Chronic kidney disease, stage 3 (moderate) I10 Essential (primary) hypertension E11.40 Type 2 diabetes mellitus with diabetic neuropathy, unspecified Facility Procedures CPT4 Code: 85027741 Description: 99213 - WOUND CARE VISIT-LEV 3 EST PT Modifier: Quantity: 1 Physician Procedures CPT4 Code: 2878676 Description: 72094 - WC PHYS LEVEL 2 - EST PT ICD-10 Diagnosis Description E11.621 Type 2 diabetes mellitus with foot ulcer L97.522 Non-pressure chronic ulcer of other part of left foot with fa L97.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 with fat  layer expo L03.031 Cellulitis of right toe Modifier: t layer exposed sed Quantity: 1 Electronic Signature(s) Signed: 08/16/2017 5:56:33 PM By: Worthy Keeler [name] Entered By: Worthy Keeler on 08/12/2017 13:13:20

## 2017-08-20 DIAGNOSIS — M25532 Pain in left wrist: Secondary | ICD-10-CM | POA: Diagnosis not present

## 2017-08-25 ENCOUNTER — Ambulatory Visit (INDEPENDENT_AMBULATORY_CARE_PROVIDER_SITE_OTHER): Payer: Medicare Other

## 2017-08-25 DIAGNOSIS — E349 Endocrine disorder, unspecified: Secondary | ICD-10-CM

## 2017-08-25 MED ORDER — TESTOSTERONE CYPIONATE 200 MG/ML IM SOLN
150.0000 mg | Freq: Once | INTRAMUSCULAR | Status: AC
  Administered 2017-08-25: 150 mg via INTRAMUSCULAR

## 2017-09-01 ENCOUNTER — Ambulatory Visit (INDEPENDENT_AMBULATORY_CARE_PROVIDER_SITE_OTHER): Payer: Medicare Other | Admitting: Family Medicine

## 2017-09-01 ENCOUNTER — Other Ambulatory Visit: Payer: Self-pay

## 2017-09-01 ENCOUNTER — Encounter: Payer: Self-pay | Admitting: Family Medicine

## 2017-09-01 VITALS — BP 140/70 | HR 77 | Temp 97.8°F | Ht 71.25 in | Wt 221.8 lb

## 2017-09-01 DIAGNOSIS — J209 Acute bronchitis, unspecified: Secondary | ICD-10-CM

## 2017-09-01 DIAGNOSIS — Z87891 Personal history of nicotine dependence: Secondary | ICD-10-CM

## 2017-09-01 MED ORDER — AZITHROMYCIN 250 MG PO TABS: ORAL_TABLET | ORAL | 0 refills | Status: DC

## 2017-09-01 NOTE — Progress Notes (Signed)
Dr. Frederico Hamman T. Emrey Thornley, MD, [facility] [place], [address] Phone: [phone] Fax: [phone]  09/01/2017  Patient: [name], [name]: [id number], DOB: March 15, 1946, 72 y.o.  Primary Physician:  [name], MD   Chief Complaint  Patient presents with  . Cough    Brownish/Yellow Phelgm-started 3 or 4 days ago   Subjective:   [name] is a 72 y.o. very pleasant male patient who presents with the following:  Coughing up a bunch of brown.  He is a very nice guy, and he has a history of the 100+ pack year smoking history, and he now is feeling unwell with some significant cough and production of brown, yellow sputum, and he also is generally feeling unwell.  He has some mild earache, mild sore throat, runny nose, and some diminished appetite.  He also is noted a little bit of some diarrhea, but that is not that uncommon from his baseline.   Past Medical History, Surgical History, Social History, Family History, Problem List, Medications, and Allergies have been reviewed and updated if relevant.  Patient Active Problem List   Diagnosis Date Noted  . Uncontrolled type 2 diabetes mellitus with peripheral neuropathy ([place]) 05/11/2016    Priority: Medium  . Former very heavy cigarette smoker (more than 40 per day) 10/07/2014    Priority: Medium  . Chronic kidney disease, stage III (moderate) ([place]) 12/19/2012    Priority: Medium  . OSA (obstructive sleep apnea) 03/09/2011    Priority: Medium  . Diabetic neuropathy ([place]) 12/29/2010    Priority: Medium  . Hypertriglyceridemia 06/13/2008    Priority: Medium  . Major depressive disorder, recurrent episode, in partial remission ([place]) 06/13/2008    Priority: Medium  . Essential hypertension 06/13/2008    Priority: Medium  . Foot ulcer ([place]) 06/10/2017  . Fall at home, initial encounter 03/02/2017  . Muscle spasm 11/05/2016  . Mild cognitive disorder 06/29/2016    . Erectile dysfunction associated with type 2 diabetes mellitus ([place]) 05/28/2015  . Hypogonadism male 06/14/2012  . Obesity (BMI 30-39.9) 09/27/2011  . Family history of MI (myocardial infarction) 09/16/2010  . HIP REPLACEMENT, RIGHT, HX OF 02/05/2010  . GOUT 06/13/2008  . ALLERGIC RHINITIS 06/13/2008  . GERD 06/13/2008  . OSTEOARTHRITIS 06/13/2008    Past Medical History:  Diagnosis Date  . Allergic rhinitis due to pollen   . Chronic kidney disease, stage III (moderate) ([place]) 12/19/2012  . Depression   . Diabetes mellitus ([place])   . Diverticulosis   . Erectile dysfunction associated with type 2 diabetes mellitus ([place]) 05/28/2015  . Former very heavy cigarette smoker (more than 40 per day) 10/07/2014  . GERD (gastroesophageal reflux disease)   . Gout   . Headache   . Hyperlipidemia   . Hypertension   . Hypogonadism male 06/14/2012  . Iron deficiency   . Low testosterone   . Memory loss   . Osteoarthritis   . Personal history of colonic polyps    1996    Past Surgical History:  Procedure Laterality Date  . CARDIAC CATHETERIZATION  11/2011   [facility]  . CHOLECYSTECTOMY  1980  . EYE SURGERY Right 07/29/2016   cataract - Dr. Manuella Ghazi  . EYE SURGERY Left 08/23/2106   cataract - Dr. Manuella Ghazi  . TOTAL HIP ARTHROPLASTY  2009   Dr. Gladstone Lighter  . TOTAL HIP ARTHROPLASTY      Social History   Socioeconomic History  . Marital  status: Married    Spouse name: Not on file  . Number of children: 4  . Years of education: 12th  . Highest education level: Not on file  Occupational History  . Occupation: Counsellor  . 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 Financial resource strain: Not on file  . Food insecurity:    Worry: Not on file    Inability: Not on file  . Transportation needs:    Medical: Not on file    Non-medical: Not on file  Tobacco Use  . Smoking status: Former Smoker    Packs/day: 3.00    Years: 35.00    Pack years: 105.00     Types: Cigarettes    Last attempt to quit: 04/12/2000    Years since quitting: 17.4  . Smokeless tobacco: Never Used  Substance and Sexual Activity  . Alcohol use: No  . Drug use: No  . Sexual activity: Never  Lifestyle  . Physical activity:    Days per week: Not on file    Minutes per session: Not on file  . Stress: Not on file  Relationships  . Social connections:    Talks on phone: Not on file    Gets together: Not on file    Attends religious service: Not on file    Active member of club or organization: Not on file    Attends meetings of clubs or organizations: Not on file    Relationship status: Not on file  . Intimate partner violence:    Fear of current or ex partner: Not on file    Emotionally abused: Not on file    Physically abused: Not on file    Forced sexual activity: Not on file  Other Topics Concern  . Not on file  Social History Narrative   No regular exercise   Caffeine use: yes, dt coke   Lives at home with his wife and his mother lives with him.   Right-handed.    Family History  Problem Relation Age of Onset  . Diabetes Mother   . Alzheimer's disease Mother   . Diabetes Father   . Lung cancer Father        Age 29  . Stroke Father   . Heart attack Father   . Lung cancer Sister        lung  . Heart disease Maternal Grandfather   . COPD Sister   . Heart attack Sister   . Heart disease Sister     Allergies  Allergen Reactions  . Tetracycline Itching and Rash    Medication list reviewed and updated in full in [facility].  ROS: GEN: Acute illness details above GI: Tolerating PO intake GU: maintaining adequate hydration and urination Pulm: No SOB Interactive and getting along well at home.  Otherwise, ROS is as per the HPI.  Objective:   BP 140/70   Pulse 77   Temp 97.8 F (36.6 C) (Oral)   Ht 5' 11.25" (1.81 m)   Wt 221 lb 12 oz (100.6 kg)   SpO2 100%   BMI 30.71 kg/m    GEN: A and O x 3. WDWN. NAD.    ENT: Nose  clear, ext NML.  No LAD.  No JVD.  TM's clear. Oropharynx clear.  PULM: Normal WOB, no distress. No crackles, wheezes, scattered rhonchi. CV: RRR, no M/G/R, No rubs, No JVD.   EXT: warm and well-perfused, No c/c/e. PSYCH:  Pleasant and conversant.    Laboratory and Imaging Data:  Assessment and Plan:   Acute bronchitis, unspecified organism  Former very heavy cigarette smoker (more than 40 per day)  Viral vs bact bronchitis in a 100+ pack-year smoker  Follow-up: No follow-ups on file.  Meds ordered this encounter  Medications  . azithromycin (ZITHROMAX) 250 MG tablet    Sig: 2 tabs po on day 1, then 1 tab po for 4 days    Dispense:  6 tablet    Refill:  0   Signed,  Yashua Bracco T. Curly Mackowski, MD   Allergies as of 09/01/2017      Reactions   Tetracycline Itching, Rash      Medication List        Accurate as of 09/01/17  1:56 PM. Always use your most recent med list.          aspirin EC 81 MG tablet Take 81 mg by mouth at bedtime.   atenolol 100 MG tablet Commonly known as:  TENORMIN Take 1 tablet (100 mg total) by mouth daily.   azithromycin 250 MG tablet Commonly known as:  ZITHROMAX 2 tabs po on day 1, then 1 tab po for 4 days   buPROPion 150 MG 12 hr tablet Commonly known as:  WELLBUTRIN SR TAKE 1 TABLET BY MOUTH TWICE A DAY   cholecalciferol 1000 units tablet Commonly known as:  VITAMIN D Take 1,000 Units by mouth daily.   donepezil 10 MG tablet Commonly known as:  ARICEPT Take 1 tablet (10 mg total) by mouth at bedtime.   doxazosin 8 MG tablet Commonly known as:  CARDURA Take 0.5 tablets (4 mg total) by mouth at bedtime.   fluticasone 50 MCG/ACT nasal spray Commonly known as:  FLONASE INHALE 2 SPRAYS INTO EACH NOSTRIL EVERY DAY AS NEEDED FOR CONGESTION   gabapentin 300 MG capsule Commonly known as:  NEURONTIN Take 1 capsule (300 mg total) by mouth 2 (two) times daily.   ibuprofen 200 MG tablet Commonly known as:  ADVIL,MOTRIN Take 200-400 mg  by mouth every 6 (six) hours as needed for headache (pain).   insulin NPH Human 100 UNIT/ML injection Commonly known as:  HUMULIN N,NOVOLIN N Inject 35 Units into the skin 2 (two) times daily before a meal.   insulin regular 100 units/mL injection Commonly known as:  NOVOLIN R,HUMULIN R Inject 35 Units into the skin 2 (two) times daily before a meal.   lisinopril-hydrochlorothiazide 20-12.5 MG tablet Commonly known as:  PRINZIDE,ZESTORETIC Take 1 tablet by mouth daily.   memantine 10 MG tablet Commonly known as:  NAMENDA Take 1 tablet (10 mg total) by mouth 2 (two) times daily.   nystatin cream Commonly known as:  MYCOSTATIN Apply 1 application topically 2 (two) times daily.   sildenafil 20 MG tablet Commonly known as:  REVATIO Generic Revatio / Sildanefil 20 mg. 2 - 5 tabs 30 mins prior to intercourse.   simvastatin 40 MG tablet Commonly known as:  ZOCOR TAKE ONE TABLET BY MOUTH EVERY NIGHT AT BEDTIME   testosterone cypionate 200 MG/ML injection Commonly known as:  DEPOTESTOSTERONE CYPIONATE INJECT 0.75 MLS (150 MG) INTO THE MUSCLE EVERY 14 DAYS   venlafaxine XR 75 MG 24 hr capsule Commonly known as:  EFFEXOR-XR TAKE ONE CAPSULE (75 MG) BY MOUTH EVERY MORNING AND TWO CAPSULES (150 MG) AT NIGHT   ZANTAC 75 75 MG tablet Generic drug:  ranitidine Take 225 mg by mouth at bedtime.

## 2017-09-07 NOTE — Progress Notes (Signed)
RUFUS, BESKE ([id number]) Visit Report for 08/12/2017 Arrival Information Details Patient Name: [name] Date of Service: 08/12/2017 11:00 AM Medical Record Number: [id number] Patient Account Number: [id number] Date of Birth/Sex: December 21, 1945 (72 y.o. M) Treating RN: Montey Hora Primary Care Miah Boye: [name] Other Clinician: Referring Braya Habermehl: [name] Treating Abygayle Deltoro/Extender: Melburn Hake, HOYT Weeks in Treatment: 7 Visit Information History Since Last Visit Added or deleted any medications: No Patient Arrived: Ambulatory Any new allergies or adverse reactions: No Arrival Time: 11:01 Had a fall or experienced change in No Accompanied By: self activities of daily living that may affect Transfer Assistance: None risk of falls: Patient Identification Verified: Yes Signs or symptoms of abuse/neglect since last visito No Secondary Verification Process Completed: Yes Hospitalized since last visit: No Patient Requires Transmission-Based No Implantable device outside of the clinic excluding No Precautions: cellular tissue based products placed in the center Patient Has Alerts: No since last visit: Has Dressing in Place as Prescribed: Yes Pain Present Now: No Electronic Signature(s) Signed: 09/07/2017 7:45:54 AM By: Harold Barban Entered By: Harold Barban on 08/12/2017 11:03:35 [name] ([id number]) -------------------------------------------------------------------------------- Clinic Level of Care Assessment Details Patient Name: [name] Date of Service: 08/12/2017 11:00 AM Medical Record Number: [id number] Patient Account Number: [id number] Date of Birth/Sex: Jan 11, 1946 (71 y.o. M) Treating RN: Montey Hora Primary Care Eron Staat: [name] Other Clinician: Referring Alyxandra Tenbrink: [name] Treating Taurus Alamo/Extender: Melburn Hake, HOYT Weeks in Treatment: 7 Clinic Level of Care Assessment Items TOOL 4 Quantity Score []   - Use when only an EandM is performed on FOLLOW-UP visit 0 ASSESSMENTS - Nursing Assessment / Reassessment X - Reassessment of Co-morbidities (includes updates in patient status) 1 10 X- 1 5 Reassessment of Adherence to Treatment Plan ASSESSMENTS - Wound and Skin Assessment / Reassessment []  - Simple Wound Assessment / Reassessment - one wound 0 X- 2 5 Complex Wound Assessment / Reassessment - multiple wounds []  - 0 Dermatologic / Skin Assessment (not related to wound area) ASSESSMENTS - Focused Assessment []  - Circumferential Edema Measurements - multi extremities 0 []  - 0 Nutritional Assessment / Counseling / Intervention X- 1 5 Lower Extremity Assessment (monofilament, tuning fork, pulses) []  - 0 Peripheral Arterial Disease Assessment (using hand held doppler) ASSESSMENTS - Ostomy and/or Continence Assessment and Care []  - Incontinence Assessment and Management 0 []  - 0 Ostomy Care Assessment and Management (repouching, etc.) PROCESS - Coordination of Care X - Simple Patient / Family Education for ongoing care 1 15 []  - 0 Complex (extensive) Patient / Family Education for ongoing care []  - 0 Staff obtains Programmer, systems, Records, Test Results / Process Orders []  - 0 Staff telephones HHA, Nursing Homes / Clarify orders / etc []  - 0 Routine Transfer to another Facility (non-emergent condition) []  - 0 Routine Hospital Admission (non-emergent condition) []  - 0 New Admissions / Biomedical engineer / Ordering NPWT, Apligraf, etc. []  - 0 Emergency Hospital Admission (emergent condition) X- 1 10 Simple Discharge Coordination [name] ([id number]) []  - 0 Complex (extensive) Discharge Coordination PROCESS - Special Needs []  - Pediatric / Minor Patient Management 0 []  - 0 Isolation Patient Management []  - 0 Hearing / Language / Visual special needs []  - 0 Assessment of Community assistance (transportation, D/C planning, etc.) []  - 0 Additional assistance / Altered  mentation []  - 0 Support Surface(s) Assessment (bed, cushion, seat, etc.) INTERVENTIONS - Wound Cleansing / Measurement []  - Simple Wound Cleansing - one wound 0 X- 2 5 Complex Wound Cleansing -  multiple wounds X- 1 5 Wound Imaging (photographs - any number of wounds) []  - 0 Wound Tracing (instead of photographs) []  - 0 Simple Wound Measurement - one wound X- 2 5 Complex Wound Measurement - multiple wounds INTERVENTIONS - Wound Dressings []  - Small Wound Dressing one or multiple wounds 0 []  - 0 Medium Wound Dressing one or multiple wounds []  - 0 Large Wound Dressing one or multiple wounds []  - 0 Application of Medications - topical []  - 0 Application of Medications - injection INTERVENTIONS - Miscellaneous []  - External ear exam 0 []  - 0 Specimen Collection (cultures, biopsies, blood, body fluids, etc.) []  - 0 Specimen(s) / Culture(s) sent or taken to Lab for analysis []  - 0 Patient Transfer (multiple staff / Civil Service fast streamer / Similar devices) []  - 0 Simple Staple / Suture removal (25 or less) []  - 0 Complex Staple / Suture removal (26 or more) []  - 0 Hypo / Hyperglycemic Management (close monitor of Blood Glucose) []  - 0 Ankle / Brachial Index (ABI) - do not check if billed separately X- 1 5 Vital Signs [name], [name] ([id number]) Has the patient been seen at the hospital within the last three years: Yes Total Score: 85 Level Of Care: New/Established - Level 3 Electronic Signature(s) Signed: 08/12/2017 4:48:08 PM By: Montey Hora Entered By: Montey Hora on 08/12/2017 11:47:38 [name] ([id number]) -------------------------------------------------------------------------------- Encounter Discharge Information Details Patient Name: [name] Date of Service: 08/12/2017 11:00 AM Medical Record Number: [id number] Patient Account Number: [id number] Date of Birth/Sex: [birth year] (71 y.o. M) Treating RN: Montey Hora Primary Care Shonice Wrisley: [name] Other Clinician: Referring Dalyah Pla: [name] Treating Davene Jobin/Extender: Melburn Hake, HOYT Weeks in Treatment: 7 Encounter Discharge Information Items Discharge Pain Level: 0 Discharge Condition: Stable Ambulatory Status: Ambulatory Discharge Destination: Home Transportation: Private Auto Accompanied By: self Schedule Follow-up Appointment: No Medication Reconciliation completed and No provided to Patient/Care Daeveon Zweber: Provided on Clinical Summary of Care: 08/12/2017 Form Type Recipient Paper Patient JJ Electronic Signature(s) Signed: 08/12/2017 4:48:08 PM By: Montey Hora Entered By: Montey Hora on 08/12/2017 11:57:14 [name] ([id number]) -------------------------------------------------------------------------------- Lower Extremity Assessment Details Patient Name: [name] Date of Service: 08/12/2017 11:00 AM Medical Record Number: [id number] Patient Account Number: [id number] Date of Birth/Sex: 08/29/1945 (71 y.o. M) Treating RN: Montey Hora Primary Care Zoiey Christy: [name] Other Clinician: Referring Faduma Cho: [name] Treating Amily Depp/Extender: Melburn Hake, HOYT Weeks in Treatment: 7 Edema Assessment Assessed: [Left: No] [Right: No] Edema: [Left: No] [Right: No] Vascular Assessment Pulses: Dorsalis Pedis Palpable: [Left:Yes] [Right:Yes] Posterior Tibial Palpable: [Left:Yes] [Right:Yes] Popliteal Palpable: [Left:No] [Right:No] Extremity colors, hair growth, and conditions: Extremity Color: [Left:Normal] [Right:Normal] Hair Growth on Extremity: [Left:Yes] [Right:Yes] Temperature of Extremity: [Left:Warm] [Right:Warm] Capillary Refill: [Left:< 3 seconds] [Right:< 3 seconds] Electronic Signature(s) Signed: 08/12/2017 4:48:08 PM By: Montey Hora Signed: 09/07/2017 7:45:54 AM By: Harold Barban Entered By: Harold Barban on 08/12/2017 11:15:41 [name]  ([id number]) -------------------------------------------------------------------------------- Multi-Disciplinary Care Plan Details Patient Name: [name] Date of Service: 08/12/2017 11:00 AM Medical Record Number: [id number] Patient Account Number: [id number] Date of Birth/Sex: 28-May-1945 (71 y.o. M) Treating RN: Montey Hora Primary Care Jourden Gilson: [name] Other Clinician: Referring Laquentin Loudermilk: [name] Treating Kycen Spalla/Extender: Melburn Hake, HOYT Weeks in Treatment: 7 Active Inactive Electronic Signature(s) Signed: 08/12/2017 4:48:08 PM By: Montey Hora Entered By: Montey Hora on 08/12/2017 11:46:43 [name] ([id number]) -------------------------------------------------------------------------------- Pain Assessment Details Patient Name: [name] Date of Service: 08/12/2017 11:00 AM Medical Record Number: [id number] Patient  Account Number: [id number] Date of Birth/Sex: Nov 22, 1945 (72 y.o. M) Treating RN: Montey Hora Primary Care Alayiah Fontes: [name] Other Clinician: Referring Bakary Bramer: [name] Treating Jaeleen Inzunza/Extender: Melburn Hake, HOYT Weeks in Treatment: 7 Active Problems Location of Pain Severity and Description of Pain Patient Has Paino No Site Locations Pain Management and Medication Current Pain Management: Electronic Signature(s) Signed: 08/12/2017 4:48:08 PM By: Montey Hora Signed: 09/07/2017 7:45:54 AM By: Harold Barban Entered By: Harold Barban on 08/12/2017 51:02:58 [name] ([id number]) -------------------------------------------------------------------------------- Patient/Caregiver Education Details Patient Name: [name] Date of Service: 08/12/2017 11:00 AM Medical Record Number: [id number] Patient Account Number: [id number] Date of Birth/Gender: 1945-08-03 (71 y.o. M) Treating RN: Montey Hora Primary Care Physician: [name] Other Clinician: Referring Physician: [name] Treating Physician/Extender: Sharalyn Ink in Treatment: 7 Education Assessment Education Provided To: Patient Education Topics Provided Basic Hygiene: Handouts: Other: care of newly healed ulcer sites Methods: Explain/Verbal Responses: State content correctly Electronic Signature(s) Signed: 08/12/2017 4:48:08 PM By: Montey Hora Entered By: Montey Hora on 08/12/2017 11:57:38 [name] ([id number]) -------------------------------------------------------------------------------- Wound Assessment Details Patient Name: [name] Date of Service: 08/12/2017 11:00 AM Medical Record Number: [id number] Patient Account Number: [id number] Date of Birth/Sex: 01-Nov-1945 (71 y.o. M) Treating RN: Montey Hora Primary Care Aulden Calise: [name] Other Clinician: Referring Kyley Laurel: [name] Treating Tiyon Sanor/Extender: [name], HOYT Weeks in Treatment: 7 Wound Status Wound Number: 3 Primary Diabetic Wound/Ulcer of the Lower Extremity Etiology: Wound Location: Left, Dorsal Foot Wound Healed - Epithelialized Wounding Event: Gradually Appeared Status: Date Acquired: 04/26/2017 Comorbid Cataracts, Middle ear problems, Sleep Apnea, Weeks Of Treatment: 7 History: Hypertension, Type II Diabetes, Gout, Clustered Wound: No Osteoarthritis, Neuropathy Photos Photo Uploaded By: Gretta Cool, BSN, RN, CWS, Kim on 08/18/2017 15:22:16 Wound Measurements Length: (cm) 0 % Reducti Width: (cm) 0 % Reducti Depth: (cm) 0 Epithelia Area: (cm) 0 Tunnelin Volume: (cm) 0 Undermin on in Area: 100% on in Volume: 100% lization: None g: No ing: No Wound Description Classification: Grade 3 Foul Odor Wound Margin: Flat and Intact Slough/Fi Exudate Amount: None Present After Cleansing: No brino Yes Wound Bed Granulation Amount: Medium (34-66%) Exposed Structure Granulation Quality: Red Fascia Exposed: No Necrotic Amount: Medium (34-66%) Fat Layer  (Subcutaneous Tissue) Exposed: Yes Necrotic Quality: Adherent Slough Tendon Exposed: No Muscle Exposed: No Joint Exposed: No Bone Exposed: No Periwound Skin Texture Texture Color No Abnormalities Noted: No No Abnormalities Noted: No CLETE, KUCH E. ([id number]) Callus: No Atrophie Blanche: No Crepitus: No Cyanosis: No Excoriation: No Ecchymosis: No Induration: No Erythema: Yes Rash: No Erythema Location: Red Streaks Scarring: No Hemosiderin Staining: No Mottled: No Moisture Pallor: No No Abnormalities Noted: No Rubor: No Dry / Scaly: No Maceration: Yes Temperature / Pain Temperature: No Abnormality Tenderness on Palpation: Yes Wound Preparation Ulcer Cleansing: Rinsed/Irrigated with Saline Topical Anesthetic Applied: Other: lidocaine 4%, Electronic Signature(s) Signed: 08/12/2017 4:48:08 PM By: Montey Hora Entered By: Montey Hora on 08/12/2017 11:46:30 [name] ([id number]) -------------------------------------------------------------------------------- Wound Assessment Details Patient Name: [name] Date of Service: 08/12/2017 11:00 AM Medical Record Number: [id number] Patient Account Number: [id number] Date of Birth/Sex: 1946-02-07 (71 y.o. M) Treating RN: Montey Hora Primary Care Gabrelle Roca: [name] Other Clinician: Referring Tanekia Ryans: [name] Treating Britt Theard/Extender: [name], HOYT Weeks in Treatment: 7 Wound Status Wound Number: 4 Primary Diabetic Wound/Ulcer of the Lower Extremity Etiology: Wound Location: Right, Lateral Malleolus Wound Healed - Epithelialized Wounding Event: Gradually Appeared Status: Date Acquired: 04/26/2017 Comorbid Cataracts, Middle ear problems, Sleep Apnea, Weeks  Of Treatment: 7 History: Hypertension, Type II Diabetes, Gout, Clustered Wound: No Osteoarthritis, Neuropathy Photos Photo Uploaded By: Gretta Cool, BSN, RN, CWS, Kim on 08/18/2017 15:22:33 Wound Measurements Length: (cm) 0 %  Reducti Width: (cm) 0 % Reducti Depth: (cm) 0 Epithelia Area: (cm) 0 Tunnelin Volume: (cm) 0 Undermin on in Area: 100% on in Volume: 100% lization: Small (1-33%) g: No ing: No Wound Description Classification: Grade 2 Foul Odor Wound Margin: Flat and Intact Slough/Fi Exudate Amount: None Present After Cleansing: No brino No Wound Bed Granulation Amount: None Present (0%) Exposed Structure Necrotic Amount: Large (67-100%) Fascia Exposed: No Necrotic Quality: Eschar Fat Layer (Subcutaneous Tissue) Exposed: No Tendon Exposed: No Muscle Exposed: No Joint Exposed: No Bone Exposed: No Periwound Skin Texture Texture Color No Abnormalities Noted: No No Abnormalities Noted: No TREON, KEHL. ([id number]) Callus: No Atrophie Blanche: No Crepitus: No Cyanosis: No Excoriation: No Ecchymosis: No Induration: No Erythema: Yes Rash: No Hemosiderin Staining: No Scarring: No Mottled: No Pallor: No Moisture Rubor: No No Abnormalities Noted: No Dry / Scaly: No Temperature / Pain Maceration: No Temperature: No Abnormality Tenderness on Palpation: Yes Wound Preparation Ulcer Cleansing: Rinsed/Irrigated with Saline Topical Anesthetic Applied: Other: lidocaine 4%, Electronic Signature(s) Signed: 08/12/2017 4:48:08 PM By: Montey Hora Entered By: Montey Hora on 08/12/2017 11:46:17 [name] ([id number]) -------------------------------------------------------------------------------- Vitals Details Patient Name: [name] Date of Service: 08/12/2017 11:00 AM Medical Record Number: [id number] Patient Account Number: [id number] Date of Birth/Sex: 1945-10-26 (71 y.o. M) Treating RN: Montey Hora Primary Care Kyheem Bathgate: [name] Other Clinician: Referring Marcella Dunnaway: [name] Treating Inetta Dicke/Extender: Melburn Hake, HOYT Weeks in Treatment: 7 Vital Signs Time Taken: 11:06 Temperature (F): 97.7 Height (in): 71 Pulse (bpm): 72 Weight  (lbs): 232 Respiratory Rate (breaths/min): 18 Body Mass Index (BMI): 32.4 Blood Pressure (mmHg): 148/86 Reference Range: 80 - 120 mg / dl Electronic Signature(s) Signed: 09/07/2017 7:45:54 AM By: Harold Barban Entered By: Harold Barban on 08/12/2017 11:06:56

## 2017-09-08 ENCOUNTER — Ambulatory Visit (INDEPENDENT_AMBULATORY_CARE_PROVIDER_SITE_OTHER): Payer: Medicare Other

## 2017-09-08 DIAGNOSIS — E349 Endocrine disorder, unspecified: Secondary | ICD-10-CM | POA: Diagnosis not present

## 2017-09-08 MED ORDER — TESTOSTERONE CYPIONATE 200 MG/ML IM SOLN
150.0000 mg | Freq: Once | INTRAMUSCULAR | Status: AC
  Administered 2017-09-08: 150 mg via INTRAMUSCULAR

## 2017-09-08 NOTE — Progress Notes (Signed)
Per orders of Dr. Lorelei Pont, injection of testosterone cypionate 150 mg given by Ozzie Hoyle. Patient tolerated injection well.

## 2017-09-22 ENCOUNTER — Ambulatory Visit (INDEPENDENT_AMBULATORY_CARE_PROVIDER_SITE_OTHER): Payer: Medicare Other | Admitting: Emergency Medicine

## 2017-09-22 DIAGNOSIS — E349 Endocrine disorder, unspecified: Secondary | ICD-10-CM

## 2017-09-22 MED ORDER — TESTOSTERONE CYPIONATE 200 MG/ML IM SOLN
150.0000 mg | INTRAMUSCULAR | Status: DC
  Administered 2017-09-22 – 2017-10-20 (×2): 150 mg via INTRAMUSCULAR

## 2017-09-22 NOTE — Progress Notes (Signed)
Per orders of Dr.Copland , injection of Testosterone 150mg . given by Elmon Kirschner. Patient tolerated injection well. Right upper outter quad.

## 2017-09-23 ENCOUNTER — Encounter: Payer: Self-pay | Admitting: Gastroenterology

## 2017-09-23 ENCOUNTER — Other Ambulatory Visit: Payer: Self-pay

## 2017-09-23 ENCOUNTER — Ambulatory Visit (INDEPENDENT_AMBULATORY_CARE_PROVIDER_SITE_OTHER): Payer: Medicare Other | Admitting: Gastroenterology

## 2017-09-23 VITALS — BP 156/80 | HR 85 | Resp 17 | Ht 71.0 in | Wt 221.2 lb

## 2017-09-23 DIAGNOSIS — R1319 Other dysphagia: Secondary | ICD-10-CM

## 2017-09-23 DIAGNOSIS — R1310 Dysphagia, unspecified: Secondary | ICD-10-CM | POA: Diagnosis not present

## 2017-09-23 DIAGNOSIS — K219 Gastro-esophageal reflux disease without esophagitis: Secondary | ICD-10-CM

## 2017-09-23 DIAGNOSIS — K582 Mixed irritable bowel syndrome: Secondary | ICD-10-CM | POA: Diagnosis not present

## 2017-09-23 MED ORDER — OMEPRAZOLE 40 MG PO CPDR: 40.0000 mg | DELAYED_RELEASE_CAPSULE | Freq: Two times a day (BID) | ORAL | 1 refills | Status: DC

## 2017-09-23 NOTE — Patient Instructions (Signed)
Diet for Irritable Bowel Syndrome When you have irritable bowel syndrome (IBS), the foods you eat and your eating habits are very important. IBS may cause various symptoms, such as abdominal pain, constipation, or diarrhea. Choosing the right foods can help ease discomfort caused by these symptoms. Work with your health care provider and dietitian to find the best eating plan to help control your symptoms. What general guidelines do I need to follow?  Keep a food diary. This will help you identify foods that cause symptoms. Write down: ? What you eat and when. ? What symptoms you have. ? When symptoms occur in relation to your meals.  Avoid foods that cause symptoms. Talk with your dietitian about other ways to get the same nutrients that are in these foods.  Eat more foods that contain fiber. Take a fiber supplement if directed by your dietitian.  Eat your meals slowly, in a relaxed setting.  Aim to eat 5-6 small meals per day. Do not skip meals.  Drink enough fluids to keep your urine clear or pale yellow.  Ask your health care provider if you should take an over-the-counter probiotic during flare-ups to help restore healthy gut bacteria.  If you have cramping or diarrhea, try making your meals low in fat and high in carbohydrates. Examples of carbohydrates are pasta, rice, whole grain breads and cereals, fruits, and vegetables.  If dairy products cause your symptoms to flare up, try eating less of them. You might be able to handle yogurt better than other dairy products because it contains bacteria that help with digestion. What foods are not recommended? The following are some foods and drinks that may worsen your symptoms:  Fatty foods, such as [place] fries.  Milk products, such as cheese or ice cream.  Chocolate.  Alcohol.  Products with caffeine, such as coffee.  Carbonated drinks, such as soda.  The items listed above may not be a complete list of foods and beverages to  avoid. Contact your dietitian for more information. What foods are good sources of fiber? Your health care provider or dietitian may recommend that you eat more foods that contain fiber. Fiber can help reduce constipation and other IBS symptoms. Add foods with fiber to your diet a little at a time so that your body can get used to them. Too much fiber at once might cause gas and swelling of your abdomen. The following are some foods that are good sources of fiber:  Apples.  Peaches.  Pears.  Berries.  Figs.  Broccoli (raw).  Cabbage.  Carrots.  Raw peas.  Kidney beans.  Lima beans.  Whole grain bread.  Whole grain cereal.  Where to find more information: [employer] for Functional Gastrointestinal Disorders: www.iffgd.[employer] of Diabetes and Digestive and Kidney Diseases: NetworkAffair.co.za.aspx This information is not intended to replace advice given to you by your health care provider. Make sure you discuss any questions you have with your health care provider. Document Released: 06/19/2003 Document Revised: 09/04/2015 Document Reviewed: 06/29/2013 Elsevier Interactive Patient Education  2018 [place].  High-Fiber Diet Fiber, also called dietary fiber, is a type of carbohydrate found in fruits, vegetables, whole grains, and beans. A high-fiber diet can have many health benefits. Your health care provider may recommend a high-fiber diet to help:  Prevent constipation. Fiber can make your bowel movements more regular.  Lower your cholesterol.  Relieve hemorrhoids, uncomplicated diverticulosis, or irritable bowel syndrome.  Prevent overeating as part of a weight-loss plan.  Prevent  heart disease, type 2 diabetes, and certain cancers.  What is my plan? The recommended daily intake of fiber includes:  38 grams for men under age 1.  105 grams for men over age  53.  100 grams for women under age 73.  59 grams for women over age 36.  You can get the recommended daily intake of dietary fiber by eating a variety of fruits, vegetables, grains, and beans. Your health care provider may also recommend a fiber supplement if it is not possible to get enough fiber through your diet. What do I need to know about a high-fiber diet?  Fiber supplements have not been widely studied for their effectiveness, so it is better to get fiber through food sources.  Always check the fiber content on thenutrition facts label of any prepackaged food. Look for foods that contain at least 5 grams of fiber per serving.  Ask your dietitian if you have questions about specific foods that are related to your condition, especially if those foods are not listed in the following section.  Increase your daily fiber consumption gradually. Increasing your intake of dietary fiber too quickly may cause bloating, cramping, or gas.  Drink plenty of water. Water helps you to digest fiber. What foods can I eat? Grains Whole-grain breads. Multigrain cereal. Oats and oatmeal. Brown rice. Barley. Bulgur wheat. [place]. Bran muffins. Popcorn. Rye wafer crackers. Vegetables Sweet potatoes. Spinach. Kale. Artichokes. Cabbage. Broccoli. Green peas. Carrots. Squash. Fruits Berries. Pears. Apples. Oranges. Avocados. Prunes and raisins. Dried figs. Meats and Other Protein Sources [place], kidney, pinto, and soy beans. Split peas. Lentils. Nuts and seeds. Dairy Fiber-fortified yogurt. Beverages Fiber-fortified soy milk. Fiber-fortified orange juice. Other Fiber bars. The items listed above may not be a complete list of recommended foods or beverages. Contact your dietitian for more options. What foods are not recommended? Grains White bread. Pasta made with refined flour. White rice. Vegetables Fried potatoes. Canned vegetables. Well-cooked vegetables. Fruits Fruit juice. Cooked, strained  fruit. Meats and Other Protein Sources Fatty cuts of meat. Fried Sales executive or fried fish. Dairy Milk. Yogurt. Cream cheese. Sour cream. Beverages Soft drinks. Other Cakes and pastries. Butter and oils. The items listed above may not be a complete list of foods and beverages to avoid. Contact your dietitian for more information. What are some tips for including high-fiber foods in my diet?  Eat a wide variety of high-fiber foods.  Make sure that half of all grains consumed each day are whole grains.  Replace breads and cereals made from refined flour or white flour with whole-grain breads and cereals.  Replace white rice with brown rice, bulgur wheat, or millet.  Start the day with a breakfast that is high in fiber, such as a cereal that contains at least 5 grams of fiber per serving.  Use beans in place of meat in soups, salads, or pasta.  Eat high-fiber snacks, such as berries, raw vegetables, nuts, or popcorn. This information is not intended to replace advice given to you by your health care provider. Make sure you discuss any questions you have with your health care provider. Document Released: 03/29/2005 Document Revised: 09/04/2015 Document Reviewed: 09/11/2013 Elsevier Interactive Patient Education  [employer].

## 2017-09-23 NOTE — Progress Notes (Signed)
Dustin Darby, MD [address]  [place]  [place], [place] [address]  Main: [phone]  Fax: [phone]    Gastroenterology Consultation  Referring Provider:     [name], MD Primary Care Physician:  [name], MD Primary Gastroenterologist:  Dr. Cephas [name] Reason for Consultation:     Dysphagia, heartburn and diarrhea and constipation        HPI:   [name] is a 72 y.o. male referred by Dr. [name], MD  for consultation & management of and dysphagia, heartburn and altered bowel habits  Dysphagia and heartburn: Patient reports a several years history of heartburn including nocturnal symptoms. He has been taking ranitidine and recently switched to omeprazole 20 mg daily. The benefit did not last more than few days. For the last 6 months, approximately since 04/2017 patient has been experiencing sensation of food stuck in his chest. He lost about 20 pounds in last 6 months. He drinks diet soda 6 cans every day. He has 100 pack years of smoking history  Altered bowel habits: His wife passed away in May 27, 2017, it was a sudden death from probable PE. Since then, he has been experiencing alternating episodes of constipation that lasts for 5 days followed by nonbloody diarrhea. He reports abdominal bloating, with incomplete emptying. He is now taking care of 67 year old mother who lives with him.  NSAIDs: none  Antiplts/Anticoagulants/Anti thrombotics: Aspirin 81  GI Procedures:  Colonoscopy in 2011 [facility] ENDOSCOPIC IMPRESSION:     1) Diverticulosis,mild,left sided diverticulosis     2) Three diminutive polyps removed     3) Otherwise normal examination except for small lipoma in     transverse colon.     4) Internal hemorrhoids     5) Personal history of polyps, reported by patient to be adenomas     in 1996 1. Colon, polyp(s), hepatic flexure, transverse and sigmoid colon : - HYPERPLASTIC POLYPS AND POLYPOID FRAGMENT OF BENIGN  COLONIC MUCOSA. - NO ADENOMATOUS CHANGES OR MALIGNANCY IDENTIFIED.  Past Medical History:  Diagnosis Date  . Allergic rhinitis due to pollen   . Chronic kidney disease, stage III (moderate) ([place]) 12/19/2012  . Depression   . Diabetes mellitus ([place])   . Diverticulosis   . Erectile dysfunction associated with type 2 diabetes mellitus ([place]) 05/28/2015  . Former very heavy cigarette smoker (more than 40 per day) 10/07/2014  . GERD (gastroesophageal reflux disease)   . Gout   . Headache   . Hyperlipidemia   . Hypertension   . Hypogonadism male 06/14/2012  . Iron deficiency   . Low testosterone   . Memory loss   . Osteoarthritis   . Personal history of colonic polyps    1996    Past Surgical History:  Procedure Laterality Date  . CARDIAC CATHETERIZATION  11/2011   [facility]  . CHOLECYSTECTOMY  1980  . EYE SURGERY Right 07/29/2016   cataract - Dr. Manuella Ghazi  . EYE SURGERY Left 08/23/2106   cataract - Dr. Manuella Ghazi  . TOTAL HIP ARTHROPLASTY  2009   Dr. Gladstone Lighter  . TOTAL HIP ARTHROPLASTY      Current Outpatient Medications:  .  aspirin EC 81 MG tablet, Take 81 mg by mouth at bedtime., Disp: , Rfl:  .  atenolol (TENORMIN) 100 MG tablet, Take 1 tablet (100 mg total) by mouth daily., Disp: 90 tablet, Rfl: 1 .  buPROPion (WELLBUTRIN SR) 150 MG 12 hr tablet, TAKE 1 TABLET BY MOUTH TWICE A  DAY, Disp: 180 tablet, Rfl: 1 .  cholecalciferol (VITAMIN D) 1000 UNITS tablet, Take 1,000 Units by mouth daily.  , Disp: , Rfl:  .  donepezil (ARICEPT) 10 MG tablet, Take 1 tablet (10 mg total) by mouth at bedtime., Disp: 90 tablet, Rfl: 1 .  doxazosin (CARDURA) 8 MG tablet, Take 0.5 tablets (4 mg total) by mouth at bedtime., Disp: 45 tablet, Rfl: 1 .  fluticasone (FLONASE) 50 MCG/ACT nasal spray, INHALE 2 SPRAYS INTO EACH NOSTRIL EVERY DAY AS NEEDED FOR CONGESTION, Disp: 48 g, Rfl: 1 .  gabapentin (NEURONTIN) 300 MG capsule, Take 1 capsule (300 mg total) by mouth 2 (two) times daily., Disp: 180 capsule, Rfl: 1 .   ibuprofen (ADVIL,MOTRIN) 200 MG tablet, Take 200-400 mg by mouth every 6 (six) hours as needed for headache (pain)., Disp: , Rfl:  .  insulin NPH Human (HUMULIN N,NOVOLIN N) 100 UNIT/ML injection, Inject 35 Units into the skin 2 (two) times daily before a meal., Disp: , Rfl:  .  insulin regular (NOVOLIN R,HUMULIN R) 100 units/mL injection, Inject 35 Units into the skin 2 (two) times daily before a meal., Disp: , Rfl:  .  lisinopril-hydrochlorothiazide (PRINZIDE,ZESTORETIC) 20-12.5 MG tablet, Take 1 tablet by mouth daily., Disp: 90 tablet, Rfl: 1 .  memantine (NAMENDA) 10 MG tablet, Take 1 tablet (10 mg total) by mouth 2 (two) times daily., Disp: 180 tablet, Rfl: 1 .  ranitidine (ZANTAC 75) 75 MG tablet, Take 225 mg by mouth at bedtime. , Disp: , Rfl:  .  sildenafil (REVATIO) 20 MG tablet, Generic Revatio / Sildanefil 20 mg. 2 - 5 tabs 30 mins prior to intercourse. (Patient taking differently: Take 80-100 mg by mouth daily as needed (erectile dysfunction - 30 minutes prior to intercourse). ), Disp: 10 tablet, Rfl: prn .  simvastatin (ZOCOR) 40 MG tablet, TAKE ONE TABLET BY MOUTH EVERY NIGHT AT BEDTIME, Disp: 90 tablet, Rfl: 3 .  testosterone cypionate (DEPOTESTOSTERONE CYPIONATE) 200 MG/ML injection, INJECT 0.75 MLS (150 MG) INTO THE MUSCLE EVERY 14 DAYS, Disp: 10 mL, Rfl: 1 .  venlafaxine XR (EFFEXOR-XR) 75 MG 24 hr capsule, TAKE ONE CAPSULE (75 MG) BY MOUTH EVERY MORNING AND TWO CAPSULES (150 MG) AT NIGHT, Disp: 270 capsule, Rfl: 1 .  azithromycin (ZITHROMAX) 250 MG tablet, 2 tabs po on day 1, then 1 tab po for 4 days (Patient not taking: Reported on 09/23/2017), Disp: 6 tablet, Rfl: 0 .  nystatin cream (MYCOSTATIN), Apply 1 application topically 2 (two) times daily. (Patient not taking: Reported on 09/23/2017), Disp: 30 g, Rfl: 0 .  omeprazole (PRILOSEC) 40 MG capsule, Take 1 capsule (40 mg total) by mouth 2 (two) times daily before a meal., Disp: 60 capsule, Rfl: 1  Current Facility-Administered  Medications:  .  testosterone cypionate (DEPOTESTOSTERONE CYPIONATE) injection 150 mg, 150 mg, Intramuscular, Q14 Days, Bedsole, Amy E, MD, 150 mg at 09/22/17 6440    Family History  Problem Relation Age of Onset  . Diabetes Mother   . Alzheimer's disease Mother   . Diabetes Father   . Lung cancer Father        Age 68  . Stroke Father   . Heart attack Father   . Lung cancer Sister        lung  . Heart disease Maternal Grandfather   . COPD Sister   . Heart attack Sister   . Heart disease Sister      Social History   Tobacco Use  . Smoking status:  Former Smoker    Packs/day: 3.00    Years: 35.00    Pack years: 105.00    Types: Cigarettes    Last attempt to quit: 04/12/2000    Years since quitting: 17.4  . Smokeless tobacco: Never Used  Substance Use Topics  . Alcohol use: No  . Drug use: No    Allergies as of 09/23/2017 - Review Complete 09/23/2017  Allergen Reaction Noted  . Tetracycline Itching and Rash 06/13/2008    Review of Systems:    All systems reviewed and negative except where noted in HPI.   Physical Exam:  BP (!) 156/80 (BP Location: Left Arm, Patient Position: Sitting, Cuff Size: Large)   Pulse 85   Resp 17   Ht 5\' 11"  (1.803 m)   Wt 221 lb 3.2 oz (100.3 kg)   BMI 30.85 kg/m  No LMP for male patient.  General:   Alert,  Well-developed, well-nourished, pleasant and cooperative in NAD Head:  Normocephalic and atraumatic. Eyes:  Sclera clear, no icterus.   Conjunctiva pink. Ears:  Normal auditory acuity. Nose:  No deformity, discharge, or lesions. Mouth:  No deformity or lesions,oropharynx pink & moist. Neck:  Supple; no masses or thyromegaly. Lungs:  Respirations even and unlabored.  Clear throughout to auscultation.   No wheezes, crackles, or rhonchi. No acute distress. Heart:  Regular rate and rhythm; no murmurs, clicks, rubs, or gallops. Abdomen:  Normal bowel sounds. Soft, non-tender and distended, tympanitic without masses,  hepatosplenomegaly or hernias noted.  No guarding or rebound tenderness.   Rectal: Not performed Msk:  Symmetrical without gross deformities. Good, equal movement & strength bilaterally. Pulses:  Normal pulses noted. Extremities:  No clubbing or edema.  No cyanosis. Neurologic:  Alert and oriented x3;  grossly normal neurologically. Skin:  Intact without significant lesions or rashes. No jaundice. Psych:  Alert and cooperative. Normal mood and affect.  Imaging Studies: No abdominal imaging  Assessment and Plan:   BERTEL VENARD is a 72 y.o. Caucasian male with metabolic syndrome, 366 pack years of tobacco, chronic GERD presents with 6 month history of dysphagia and alternating constipation, diarrhea with abdominal bloating and incomplete emptying and weight loss  Chronic GERD and Dysphagia: Increase omeprazole to 40 mg twice daily EGD to rule out malignancy or peptic stricture from chronic GERD  Alternating constipation and diarrhea with abdominal bloating and incomplete emptying: Symptoms are consistent with irritable bowel syndrome in the setting of recent loss of his spouse Strongly advised him to stop diet soda Trial of linaclotide 145 MCG for constipation Trial of IB guard Discussed with him about high-fiber diet, information provided If his symptoms are persistent, I'll perform colonoscopy   Follow up in 4 weeks   Dustin Darby, MD

## 2017-09-27 ENCOUNTER — Other Ambulatory Visit: Payer: Self-pay

## 2017-09-27 ENCOUNTER — Encounter: Payer: Self-pay | Admitting: Anesthesiology

## 2017-09-27 ENCOUNTER — Ambulatory Visit: Payer: Medicare Other | Admitting: Anesthesiology

## 2017-09-27 ENCOUNTER — Encounter
Admission: RE | Disposition: A | Discharge status: Home / Self Care | Payer: Self-pay | Source: Ambulatory Visit | Attending: Gastroenterology

## 2017-09-27 ENCOUNTER — Ambulatory Visit
Admission: RE | Admit: 2017-09-27 | Discharge: 2017-09-27 | Disposition: A | Discharge status: Home / Self Care | Payer: Medicare Other | Source: Ambulatory Visit | Attending: Gastroenterology | Admitting: Gastroenterology

## 2017-09-27 DIAGNOSIS — F17200 Nicotine dependence, unspecified, uncomplicated: Secondary | ICD-10-CM | POA: Insufficient documentation

## 2017-09-27 DIAGNOSIS — E785 Hyperlipidemia, unspecified: Secondary | ICD-10-CM | POA: Insufficient documentation

## 2017-09-27 DIAGNOSIS — K219 Gastro-esophageal reflux disease without esophagitis: Secondary | ICD-10-CM | POA: Diagnosis not present

## 2017-09-27 DIAGNOSIS — I129 Hypertensive chronic kidney disease with stage 1 through stage 4 chronic kidney disease, or unspecified chronic kidney disease: Secondary | ICD-10-CM | POA: Diagnosis not present

## 2017-09-27 DIAGNOSIS — R51 Headache: Secondary | ICD-10-CM | POA: Diagnosis not present

## 2017-09-27 DIAGNOSIS — Z801 Family history of malignant neoplasm of trachea, bronchus and lung: Secondary | ICD-10-CM | POA: Insufficient documentation

## 2017-09-27 DIAGNOSIS — Z96649 Presence of unspecified artificial hip joint: Secondary | ICD-10-CM | POA: Diagnosis not present

## 2017-09-27 DIAGNOSIS — Z86010 Personal history of colonic polyps: Secondary | ICD-10-CM | POA: Insufficient documentation

## 2017-09-27 DIAGNOSIS — Z794 Long term (current) use of insulin: Secondary | ICD-10-CM | POA: Insufficient documentation

## 2017-09-27 DIAGNOSIS — Z881 Allergy status to other antibiotic agents status: Secondary | ICD-10-CM | POA: Insufficient documentation

## 2017-09-27 DIAGNOSIS — M109 Gout, unspecified: Secondary | ICD-10-CM | POA: Diagnosis not present

## 2017-09-27 DIAGNOSIS — K5790 Diverticulosis of intestine, part unspecified, without perforation or abscess without bleeding: Secondary | ICD-10-CM | POA: Insufficient documentation

## 2017-09-27 DIAGNOSIS — Z8249 Family history of ischemic heart disease and other diseases of the circulatory system: Secondary | ICD-10-CM | POA: Insufficient documentation

## 2017-09-27 DIAGNOSIS — Z825 Family history of asthma and other chronic lower respiratory diseases: Secondary | ICD-10-CM | POA: Insufficient documentation

## 2017-09-27 DIAGNOSIS — N183 Chronic kidney disease, stage 3 (moderate): Secondary | ICD-10-CM | POA: Insufficient documentation

## 2017-09-27 DIAGNOSIS — Z820 Family history of epilepsy and other diseases of the nervous system: Secondary | ICD-10-CM | POA: Diagnosis not present

## 2017-09-27 DIAGNOSIS — G4733 Obstructive sleep apnea (adult) (pediatric): Secondary | ICD-10-CM | POA: Diagnosis not present

## 2017-09-27 DIAGNOSIS — K2960 Other gastritis without bleeding: Secondary | ICD-10-CM | POA: Diagnosis not present

## 2017-09-27 DIAGNOSIS — E669 Obesity, unspecified: Secondary | ICD-10-CM | POA: Insufficient documentation

## 2017-09-27 DIAGNOSIS — M1990 Unspecified osteoarthritis, unspecified site: Secondary | ICD-10-CM | POA: Diagnosis not present

## 2017-09-27 DIAGNOSIS — E1140 Type 2 diabetes mellitus with diabetic neuropathy, unspecified: Secondary | ICD-10-CM | POA: Diagnosis not present

## 2017-09-27 DIAGNOSIS — Z9049 Acquired absence of other specified parts of digestive tract: Secondary | ICD-10-CM | POA: Insufficient documentation

## 2017-09-27 DIAGNOSIS — D509 Iron deficiency anemia, unspecified: Secondary | ICD-10-CM | POA: Diagnosis not present

## 2017-09-27 DIAGNOSIS — K2950 Unspecified chronic gastritis without bleeding: Secondary | ICD-10-CM | POA: Insufficient documentation

## 2017-09-27 DIAGNOSIS — Z7982 Long term (current) use of aspirin: Secondary | ICD-10-CM | POA: Diagnosis not present

## 2017-09-27 DIAGNOSIS — Z6830 Body mass index (BMI) 30.0-30.9, adult: Secondary | ICD-10-CM | POA: Insufficient documentation

## 2017-09-27 DIAGNOSIS — R140 Abdominal distension (gaseous): Secondary | ICD-10-CM | POA: Diagnosis not present

## 2017-09-27 DIAGNOSIS — Z833 Family history of diabetes mellitus: Secondary | ICD-10-CM | POA: Insufficient documentation

## 2017-09-27 DIAGNOSIS — J309 Allergic rhinitis, unspecified: Secondary | ICD-10-CM | POA: Diagnosis not present

## 2017-09-27 DIAGNOSIS — Z823 Family history of stroke: Secondary | ICD-10-CM | POA: Insufficient documentation

## 2017-09-27 DIAGNOSIS — Z79899 Other long term (current) drug therapy: Secondary | ICD-10-CM | POA: Diagnosis not present

## 2017-09-27 DIAGNOSIS — R413 Other amnesia: Secondary | ICD-10-CM | POA: Insufficient documentation

## 2017-09-27 DIAGNOSIS — R1319 Other dysphagia: Secondary | ICD-10-CM

## 2017-09-27 DIAGNOSIS — R1314 Dysphagia, pharyngoesophageal phase: Secondary | ICD-10-CM | POA: Diagnosis not present

## 2017-09-27 DIAGNOSIS — R1310 Dysphagia, unspecified: Secondary | ICD-10-CM

## 2017-09-27 DIAGNOSIS — E1122 Type 2 diabetes mellitus with diabetic chronic kidney disease: Secondary | ICD-10-CM | POA: Insufficient documentation

## 2017-09-27 DIAGNOSIS — G4730 Sleep apnea, unspecified: Secondary | ICD-10-CM | POA: Insufficient documentation

## 2017-09-27 DIAGNOSIS — K210 Gastro-esophageal reflux disease with esophagitis: Secondary | ICD-10-CM | POA: Diagnosis not present

## 2017-09-27 DIAGNOSIS — F329 Major depressive disorder, single episode, unspecified: Secondary | ICD-10-CM | POA: Insufficient documentation

## 2017-09-27 HISTORY — PX: ESOPHAGOGASTRODUODENOSCOPY (EGD) WITH PROPOFOL: SHX5813

## 2017-09-27 LAB — GLUCOSE, CAPILLARY: GLUCOSE-CAPILLARY: 238 mg/dL — AB (ref 65–99)

## 2017-09-27 SURGERY — ESOPHAGOGASTRODUODENOSCOPY (EGD) WITH PROPOFOL
Anesthesia: General

## 2017-09-27 MED ORDER — PROPOFOL 10 MG/ML IV BOLUS
INTRAVENOUS | Status: AC
  Filled 2017-09-27: qty 20

## 2017-09-27 MED ORDER — SODIUM CHLORIDE 0.9 % IV SOLN
INTRAVENOUS | Status: DC
  Administered 2017-09-27: 15:00:00 via INTRAVENOUS

## 2017-09-27 MED ORDER — GLYCOPYRROLATE 0.2 MG/ML IJ SOLN
INTRAMUSCULAR | Status: DC | PRN
  Administered 2017-09-27: 0.2 mg via INTRAVENOUS

## 2017-09-27 MED ORDER — GLYCOPYRROLATE 0.2 MG/ML IJ SOLN
INTRAMUSCULAR | Status: AC
  Filled 2017-09-27: qty 1

## 2017-09-27 MED ORDER — PHENYLEPHRINE HCL 10 MG/ML IJ SOLN
INTRAMUSCULAR | Status: DC | PRN
  Administered 2017-09-27: 100 ug via INTRAVENOUS

## 2017-09-27 MED ORDER — LIDOCAINE HCL (CARDIAC) PF 100 MG/5ML IV SOSY
PREFILLED_SYRINGE | INTRAVENOUS | Status: DC | PRN
  Administered 2017-09-27: 50 mg via INTRAVENOUS

## 2017-09-27 MED ORDER — LIDOCAINE HCL (PF) 2 % IJ SOLN
INTRAMUSCULAR | Status: AC
  Filled 2017-09-27: qty 10

## 2017-09-27 MED ORDER — PROPOFOL 10 MG/ML IV BOLUS
INTRAVENOUS | Status: DC | PRN
  Administered 2017-09-27: 150 mg via INTRAVENOUS
  Administered 2017-09-27 (×3): 50 mg via INTRAVENOUS

## 2017-09-27 NOTE — H&P (Signed)
Dustin Darby, MD [address]  [place]  [place], [place] [address]  Main: [phone]  Fax: [phone] Pager: [phone]  Primary Care Physician:  [name], MD Primary Gastroenterologist:  Dr. Cephas [name]  Pre-Procedure History & Physical: HPI:  [name] is a 72 y.o. male is here for an endoscopy.   Past Medical History:  Diagnosis Date  . Allergic rhinitis due to pollen   . Chronic kidney disease, stage III (moderate) ([place]) 12/19/2012  . Depression   . Diabetes mellitus ([place])   . Diverticulosis   . Erectile dysfunction associated with type 2 diabetes mellitus ([place]) 05/28/2015  . Former very heavy cigarette smoker (more than 40 per day) 10/07/2014  . GERD (gastroesophageal reflux disease)   . Gout   . Headache   . Hyperlipidemia   . Hypertension   . Hypogonadism male 06/14/2012  . Iron deficiency   . Low testosterone   . Memory loss   . Osteoarthritis   . Personal history of colonic polyps    1996    Past Surgical History:  Procedure Laterality Date  . CARDIAC CATHETERIZATION  11/2011   [facility]  . CHOLECYSTECTOMY  1980  . EYE SURGERY Right 07/29/2016   cataract - Dr. Manuella Ghazi  . EYE SURGERY Left 08/23/2106   cataract - Dr. Manuella Ghazi  . TOTAL HIP ARTHROPLASTY  2009   Dr. Gladstone Lighter  . TOTAL HIP ARTHROPLASTY      Prior to Admission medications   Medication Sig Start Date End Date Taking? Authorizing Provider  aspirin EC 81 MG tablet Take 81 mg by mouth at bedtime.   Yes [provider]  atenolol (TENORMIN) 100 MG tablet Take 1 tablet (100 mg total) by mouth daily. 06/06/17  Yes Copland, Frederico Hamman, MD  buPROPion (WELLBUTRIN SR) 150 MG 12 hr tablet TAKE 1 TABLET BY MOUTH TWICE A DAY 06/07/17  Yes Copland, Spencer, MD  cholecalciferol (VITAMIN D) 1000 UNITS tablet Take 1,000 Units by mouth daily.     Yes [provider]  donepezil (ARICEPT) 10 MG tablet Take 1 tablet (10 mg total) by mouth at bedtime. 06/06/17  Yes Copland, Frederico Hamman, MD    doxazosin (CARDURA) 8 MG tablet Take 0.5 tablets (4 mg total) by mouth at bedtime. 08/03/17  Yes Copland, Frederico Hamman, MD  fluticasone (FLONASE) 50 MCG/ACT nasal spray INHALE 2 SPRAYS INTO EACH NOSTRIL EVERY DAY AS NEEDED FOR CONGESTION 06/06/17  Yes Copland, Frederico Hamman, MD  ibuprofen (ADVIL,MOTRIN) 200 MG tablet Take 200-400 mg by mouth every 6 (six) hours as needed for headache (pain).   Yes [provider]  insulin NPH Human (HUMULIN N,NOVOLIN N) 100 UNIT/ML injection Inject 35 Units into the skin 2 (two) times daily before a meal.   Yes [provider]  insulin regular (NOVOLIN R,HUMULIN R) 100 units/mL injection Inject 35 Units into the skin 2 (two) times daily before a meal.   Yes [provider]  lisinopril-hydrochlorothiazide (PRINZIDE,ZESTORETIC) 20-12.5 MG tablet Take 1 tablet by mouth daily. 08/03/17  Yes Copland, Frederico Hamman, MD  memantine (NAMENDA) 10 MG tablet Take 1 tablet (10 mg total) by mouth 2 (two) times daily. 06/06/17  Yes Copland, Frederico Hamman, MD  omeprazole (PRILOSEC) 40 MG capsule Take 1 capsule (40 mg total) by mouth 2 (two) times daily before a meal. 09/23/17 11/22/17 Yes Vanga, Tally Due, MD  simvastatin (ZOCOR) 40 MG tablet TAKE ONE TABLET BY MOUTH EVERY NIGHT AT BEDTIME 06/22/17  Yes Copland, Spencer, MD  venlafaxine XR (EFFEXOR-XR) 75 MG  24 hr capsule TAKE ONE CAPSULE (75 MG) BY MOUTH EVERY MORNING AND TWO CAPSULES (150 MG) AT NIGHT 06/06/17  Yes Copland, Frederico Hamman, MD  azithromycin (ZITHROMAX) 250 MG tablet 2 tabs po on day 1, then 1 tab po for 4 days Patient not taking: Reported on 09/23/2017 09/01/17   Copland, Frederico Hamman, MD  gabapentin (NEURONTIN) 300 MG capsule Take 1 capsule (300 mg total) by mouth 2 (two) times daily. Patient not taking: Reported on 09/27/2017 06/06/17   [name], MD  nystatin cream (MYCOSTATIN) Apply 1 application topically 2 (two) times daily. Patient not taking: Reported on 09/23/2017 06/10/17   Tower, Wynelle Fanny, MD  ranitidine (ZANTAC 75)  75 MG tablet Take 225 mg by mouth at bedtime.     [provider]  sildenafil (REVATIO) 20 MG tablet Generic Revatio / Sildanefil 20 mg. 2 - 5 tabs 30 mins prior to intercourse. Patient not taking: Reported on 09/27/2017 05/28/15   [name], MD  testosterone cypionate (DEPOTESTOSTERONE CYPIONATE) 200 MG/ML injection INJECT 0.75 MLS (150 MG) INTO THE MUSCLE EVERY 14 DAYS Patient not taking: Reported on 09/27/2017 05/06/17   [name], MD    Allergies as of 09/23/2017 - Review Complete 09/23/2017  Allergen Reaction Noted  . Tetracycline Itching and Rash 06/13/2008    Family History  Problem Relation Age of Onset  . Diabetes Mother   . Alzheimer's disease Mother   . Diabetes Father   . Lung cancer Father        Age 22  . Stroke Father   . Heart attack Father   . Lung cancer Sister        lung  . Heart disease Maternal Grandfather   . COPD Sister   . Heart attack Sister   . Heart disease Sister     Social History   Socioeconomic History  . Marital status: Single    Spouse name: Not on file  . Number of children: 4  . Years of education: 12th  . Highest education level: Not on file  Occupational History  . Occupation: Counsellor  . 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 Financial resource strain: Not on file  . Food insecurity:    Worry: Not on file    Inability: Not on file  . Transportation needs:    Medical: Not on file    Non-medical: Not on file  Tobacco Use  . Smoking status: Former Smoker    Packs/day: 3.00    Years: 35.00    Pack years: 105.00    Types: Cigarettes    Last attempt to quit: 04/12/2000    Years since quitting: 17.4  . Smokeless tobacco: Never Used  Substance and Sexual Activity  . Alcohol use: No  . Drug use: No  . Sexual activity: Never  Lifestyle  . Physical activity:    Days per week: Not on file    Minutes per session: Not on file  . Stress: Not on file  Relationships  .  Social connections:    Talks on phone: Not on file    Gets together: Not on file    Attends religious service: Not on file    Active member of club or organization: Not on file    Attends meetings of clubs or organizations: Not on file    Relationship status: Not on file  . Intimate partner violence:    Fear of current or ex partner: Not on file  Emotionally abused: Not on file    Physically abused: Not on file    Forced sexual activity: Not on file  Other Topics Concern  . Not on file  Social History Narrative   No regular exercise   Caffeine use: yes, dt coke   Lives at home with his wife and his mother lives with him.   Right-handed.    Review of Systems: See HPI, otherwise negative ROS  Physical Exam: BP 122/74   Pulse 83   Temp (!) 97 F (36.1 C) (Tympanic)   Resp 20   Ht 5\' 11"  (1.803 m)   Wt 219 lb (99.3 kg)   SpO2 100%   BMI 30.54 kg/m  General:   Alert,  pleasant and cooperative in NAD Head:  Normocephalic and atraumatic. Neck:  Supple; no masses or thyromegaly. Lungs:  Clear throughout to auscultation.    Heart:  Regular rate and rhythm. Abdomen:  Soft, nontender and nondistended. Normal bowel sounds, without guarding, and without rebound.   Neurologic:  Alert and  oriented x4;  grossly normal neurologically.  Impression/Plan: [name] is here for an endoscopy to be performed for dysphagia  Risks, benefits, limitations, and alternatives regarding  endoscopy have been reviewed with the patient.  Questions have been answered.  All parties agreeable.   Sherri [name], MD  09/27/2017, 3:06 PM

## 2017-09-27 NOTE — Transfer of Care (Signed)
Immediate Anesthesia Transfer of Care Note  Patient: [name]  Procedure(s) Performed: ESOPHAGOGASTRODUODENOSCOPY (EGD) WITH PROPOFOL (N/A )  Patient Location: PACU and Endoscopy Unit  Anesthesia Type:General  Level of Consciousness: drowsy and patient cooperative  Airway & Oxygen Therapy: Patient Spontanous Breathing  Post-op Assessment: Report given to RN, Post -op Vital signs reviewed and stable and Patient moving all extremities  Post vital signs: Reviewed and stable  Last Vitals:  Vitals Value Taken Time  BP 96/65 09/27/2017  4:30 PM  Temp    Pulse 78 09/27/2017  4:30 PM  Resp 15 09/27/2017  4:30 PM  SpO2 96 % 09/27/2017  4:30 PM    Last Pain:  Vitals:   09/27/17 1630  TempSrc: (P) Tympanic  PainSc:          Complications: No apparent anesthesia complications

## 2017-09-27 NOTE — Anesthesia Postprocedure Evaluation (Signed)
Anesthesia Post Note  Patient: [name]  Procedure(s) Performed: ESOPHAGOGASTRODUODENOSCOPY (EGD) WITH PROPOFOL (N/A )  Patient location during evaluation: Endoscopy Anesthesia Type: General Level of consciousness: awake and alert, oriented and patient cooperative Pain management: satisfactory to patient Vital Signs Assessment: post-procedure vital signs reviewed and stable Respiratory status: spontaneous breathing and respiratory function stable Cardiovascular status: blood pressure returned to baseline and stable Postop Assessment: no headache, no backache, patient able to bend at knees, no apparent nausea or vomiting, adequate PO intake and able to ambulate Anesthetic complications: no     Last Vitals:  Vitals:   09/27/17 1444 09/27/17 1630  BP: 122/74 96/65  Pulse: 83 78  Resp: 20 15  Temp: (!) 36.1 C (!) 36.2 C  SpO2: 100% 96%    Last Pain:  Vitals:   09/27/17 1630  TempSrc: Tympanic  PainSc: Asleep                 [name]

## 2017-09-27 NOTE — Anesthesia Post-op Follow-up Note (Signed)
Anesthesia QCDR form completed.        

## 2017-09-27 NOTE — Op Note (Signed)
[facility] Patient Name: [name] Procedure Date: 09/27/2017 4:04 PM [name]: [id number] Account #: [id number] Date of Birth: 1945/09/26 Admit Type: Outpatient Age: 72 Room: [facility] ENDO ROOM 4 Gender: Male Note Status: Finalized Procedure:            Upper GI endoscopy Indications:          Dysphagia Providers:            Lin Landsman MD, MD Referring MD:         Maud Deed. Copland MD, MD (Referring MD) Medicines:            Monitored Anesthesia Care Complications:        No immediate complications. Estimated blood loss: None. Procedure:            Pre-Anesthesia Assessment:                       - Prior to the procedure, a History and Physical was                        performed, and patient medications and allergies were                        reviewed. The patient is competent. The risks and                        benefits of the procedure and the sedation options and                        risks were discussed with the patient. All questions                        were answered and informed consent was obtained.                        Patient identification and proposed procedure were                        verified by the physician, the nurse, the                        anesthesiologist, the anesthetist and the technician in                        the pre-procedure area in the procedure room in the                        endoscopy suite. Mental Status Examination: alert and                        oriented. Airway Examination: normal oropharyngeal                        airway and neck mobility. Respiratory Examination:                        clear to auscultation. CV Examination: normal.                        Prophylactic Antibiotics: The patient does not require  prophylactic antibiotics. Prior Anticoagulants: The                        patient has taken no previous anticoagulant or   antiplatelet agents. ASA Grade Assessment: III - A                        patient with severe systemic disease. After reviewing                        the risks and benefits, the patient was deemed in                        satisfactory condition to undergo the procedure. The                        anesthesia plan was to use monitored anesthesia care                        (MAC). Immediately prior to administration of                        medications, the patient was re-assessed for adequacy                        to receive sedatives. The heart rate, respiratory rate,                        oxygen saturations, blood pressure, adequacy of                        pulmonary ventilation, and response to care were                        monitored throughout the procedure. The physical status                        of the patient was re-assessed after the procedure.                       After obtaining informed consent, the endoscope was                        passed under direct vision. Throughout the procedure,                        the patient's blood pressure, pulse, and oxygen                        saturations were monitored continuously. The Endoscope                        was introduced through the mouth, and advanced to the                        second part of duodenum. The upper GI endoscopy was                        accomplished without difficulty. The patient tolerated  the procedure well. Findings:      The duodenal bulb and second portion of the duodenum were normal.      The entire examined stomach was normal. Biopsies were taken with a cold       forceps for Helicobacter pylori testing.      The cardia and gastric fundus were normal on retroflexion.      The gastroesophageal junction and examined esophagus were normal.       Biopsies were obtained from the proximal and distal esophagus with cold       forceps for histology of suspected eosinophilic  esophagitis. Impression:           - Normal duodenal bulb and second portion of the                        duodenum.                       - Normal stomach. Biopsied.                       - Normal gastroesophageal junction and esophagus.                        Biopsied. Recommendation:       - Discharge patient to home (with escort).                       - Resume previous diet today.                       - Continue present medications.                       - Await pathology results.                       - Return to my office as previously scheduled. Procedure Code(s):    --- Professional ---                       [phone], Esophagogastroduodenoscopy, flexible, transoral;                        with biopsy, single or multiple Diagnosis Code(s):    --- Professional ---                       R13.10, Dysphagia, unspecified CPT copyright 2017 American Medical Association. All rights reserved. The codes documented in this report are preliminary and upon coder review may  be revised to meet current compliance requirements. Dr. Ulyess Mort Lin Landsman MD, MD 09/27/2017 4:28:29 PM This report has been signed electronically. Number of Addenda: 0 Note Initiated On: 09/27/2017 4:04 PM      [facility]

## 2017-09-27 NOTE — Anesthesia Preprocedure Evaluation (Addendum)
Anesthesia Evaluation  Patient identified by [name], date of birth, ID band Patient awake    Reviewed: Allergy & Precautions, NPO status , Patient's Chart, lab work & pertinent test results, reviewed documented beta blocker date and time   Airway Mallampati: III  TM Distance: >3 FB     Dental  (+) Upper Dentures, Lower Dentures   Pulmonary sleep apnea , former smoker,           Cardiovascular hypertension, Pt. on medications and Pt. on home beta blockers      Neuro/Psych  Headaches, PSYCHIATRIC DISORDERS Depression  Neuromuscular disease    GI/Hepatic GERD  ,  Endo/Other  diabetes, Type 2  Renal/GU Renal disease     Musculoskeletal  (+) Arthritis ,   Abdominal   Peds  Hematology   Anesthesia Other Findings Obese.smokes. EKG ok. ECHO ok.  Reproductive/Obstetrics                            Anesthesia Physical Anesthesia Plan  ASA: III  Anesthesia Plan: General   Post-op Pain Management:    Induction: Intravenous  PONV Risk Score and Plan:   Airway Management Planned: Oral ETT  Additional Equipment:   Intra-op Plan:   Post-operative Plan:   Informed Consent: I have reviewed the patients History and Physical, chart, labs and discussed the procedure including the risks, benefits and alternatives for the proposed anesthesia with the patient or authorized representative who has indicated his/her understanding and acceptance.     Plan Discussed with: CRNA  Anesthesia Plan Comments:         Anesthesia Quick Evaluation

## 2017-09-28 ENCOUNTER — Encounter: Payer: Self-pay | Admitting: Gastroenterology

## 2017-09-29 LAB — SURGICAL PATHOLOGY

## 2017-10-06 ENCOUNTER — Ambulatory Visit (INDEPENDENT_AMBULATORY_CARE_PROVIDER_SITE_OTHER): Payer: Medicare Other

## 2017-10-06 DIAGNOSIS — E349 Endocrine disorder, unspecified: Secondary | ICD-10-CM | POA: Diagnosis not present

## 2017-10-06 MED ORDER — TESTOSTERONE CYPIONATE 200 MG/ML IM SOLN
150.0000 mg | Freq: Once | INTRAMUSCULAR | Status: AC
  Administered 2017-10-06: 150 mg via INTRAMUSCULAR

## 2017-10-06 NOTE — Progress Notes (Signed)
Per orders of Dr. Damita Dunnings, injection of Testosterone cypionate 150 mg given by Ozzie Hoyle. Patient tolerated injection well.

## 2017-10-09 NOTE — Progress Notes (Signed)
Agree. Thanks

## 2017-10-14 ENCOUNTER — Ambulatory Visit (INDEPENDENT_AMBULATORY_CARE_PROVIDER_SITE_OTHER): Payer: Medicare Other | Admitting: Family Medicine

## 2017-10-14 ENCOUNTER — Encounter: Payer: Self-pay | Admitting: Family Medicine

## 2017-10-14 ENCOUNTER — Telehealth: Payer: Self-pay

## 2017-10-14 VITALS — BP 120/66 | HR 75 | Temp 98.0°F | Ht 71.0 in | Wt 226.5 lb

## 2017-10-14 DIAGNOSIS — E1142 Type 2 diabetes mellitus with diabetic polyneuropathy: Secondary | ICD-10-CM

## 2017-10-14 DIAGNOSIS — W19XXXA Unspecified fall, initial encounter: Secondary | ICD-10-CM | POA: Diagnosis not present

## 2017-10-14 DIAGNOSIS — I10 Essential (primary) hypertension: Secondary | ICD-10-CM

## 2017-10-14 DIAGNOSIS — IMO0002 Reserved for concepts with insufficient information to code with codable children: Secondary | ICD-10-CM

## 2017-10-14 DIAGNOSIS — E1165 Type 2 diabetes mellitus with hyperglycemia: Secondary | ICD-10-CM

## 2017-10-14 LAB — POCT GLYCOSYLATED HEMOGLOBIN (HGB A1C): HEMOGLOBIN A1C: 12.5 % — AB (ref 4.0–5.6)

## 2017-10-14 MED ORDER — LISINOPRIL 10 MG PO TABS: 10.0000 mg | ORAL_TABLET | Freq: Every day | ORAL | 3 refills | Status: DC

## 2017-10-14 NOTE — Progress Notes (Signed)
BP 120/66 (BP Location: Right Arm, Patient Position: Sitting, Cuff Size: Normal)   Pulse 75   Temp 98 F (36.7 C) (Oral)   Ht 5\' 11"  (1.803 m)   Wt 226 lb 8 oz (102.7 kg)   SpO2 98%   BMI 31.59 kg/m    CC: fall thought due to low sugars Subjective:    Patient ID: [name], male    DOB: 01-24-1946, 72 y.o.   [name]: [id number]  HPI: [name] is a 72 y.o. male presenting on 10/14/2017 for Fall (Pt has fallen twice in last week. First on 10/07/17 while at a business. Then again 10/11/17 while at home. Says his BS keeps dropping suddenly causing him to fall. Pt provided copy of recent BS readings. )   2 falls the last few weeks - sudden dizziness described as unsteadiness/weakness that leads to fall. No LOC, presyncope, vertigo. Feels weak - legs give out. Known diabetic neuropathy.   Not caring for himself as well as he should since wife's decease 2017/05/20. Son and grand children are neighbors.  Brings log of sugars which were reviewed and will be scanned.  Sugars ranging from 47-546. 47 low before dinner.  BP ranging 127-155/67-90, HR 69-80s  Current diabetic regimen - novolin N 35u and novolin R 35u 30 mi before breakfast - this is all he's taken the last 3 days. He mixes his insulin himself, stores in bag next to chair (after opening) and it is not expired. Eats only 2 meals a day - 8am and 5pm. Pack of crackers during the day.  Prior regimen was novolin N/R 35u with breakfast, 30u with lunch, 35u with dinner.   He did recently start using hemp oil 1.5 wks ago. Notes lower sugars and blood pressures since starting this. Takes 1/2 vial under tongue every night. Feels improved energy, improved sleeping, no naps during the day. No other med changes or new supplements.  Overdue for f/u with Dr Cruzita Lederer.   He has fully been off all antihypertensives over the last week since starting hemp oil, states this alone is controlling blood pressure. Prior was on   Relevant past medical,  surgical, family and social history reviewed and updated as indicated. Interim medical history since our last visit reviewed. Allergies and medications reviewed and updated. Outpatient Medications Prior to Visit  Medication Sig Dispense Refill  . aspirin EC 81 MG tablet Take 81 mg by mouth at bedtime.    [name] Kitchen buPROPion (WELLBUTRIN SR) 150 MG 12 hr tablet TAKE 1 TABLET BY MOUTH TWICE A DAY 180 tablet 1  . cholecalciferol (VITAMIN D) 1000 UNITS tablet Take 1,000 Units by mouth daily.      [name] Kitchen donepezil (ARICEPT) 10 MG tablet Take 1 tablet (10 mg total) by mouth at bedtime. 90 tablet 1  . doxazosin (CARDURA) 8 MG tablet Take 0.5 tablets (4 mg total) by mouth at bedtime. 45 tablet 1  . fluticasone (FLONASE) 50 MCG/ACT nasal spray INHALE 2 SPRAYS INTO EACH NOSTRIL EVERY DAY AS NEEDED FOR CONGESTION 48 g 1  . ibuprofen (ADVIL,MOTRIN) 200 MG tablet Take 200-400 mg by mouth every 6 (six) hours as needed for headache (pain).    . insulin NPH Human (HUMULIN N,NOVOLIN N) 100 UNIT/ML injection Inject 0.25 mLs (25 Units total) into the skin 2 (two) times daily before a meal. 10 mL   . insulin regular (NOVOLIN R,HUMULIN R) 100 units/mL injection Inject 0.2 mLs (20 Units total) into the skin 2 (two) times  daily before a meal. 10 mL   . memantine (NAMENDA) 10 MG tablet Take 1 tablet (10 mg total) by mouth 2 (two) times daily. 180 tablet 1  . nystatin cream (MYCOSTATIN) Apply 1 application topically 2 (two) times daily. 30 g 0  . omeprazole (PRILOSEC) 40 MG capsule Take 1 capsule (40 mg total) by mouth 2 (two) times daily before a meal. 60 capsule 1  . ranitidine (ZANTAC 75) 75 MG tablet Take 225 mg by mouth at bedtime.     . sildenafil (REVATIO) 20 MG tablet Generic Revatio / Sildanefil 20 mg. 2 - 5 tabs 30 mins prior to intercourse. 10 tablet prn  . simvastatin (ZOCOR) 40 MG tablet TAKE ONE TABLET BY MOUTH EVERY NIGHT AT BEDTIME 90 tablet 3  . testosterone cypionate (DEPOTESTOSTERONE CYPIONATE) 200 MG/ML injection  INJECT 0.75 MLS (150 MG) INTO THE MUSCLE EVERY 14 DAYS 10 mL 1  . venlafaxine XR (EFFEXOR-XR) 75 MG 24 hr capsule TAKE ONE CAPSULE (75 MG) BY MOUTH EVERY MORNING AND TWO CAPSULES (150 MG) AT NIGHT 270 capsule 1  . atenolol (TENORMIN) 100 MG tablet Take 1 tablet (100 mg total) by mouth daily. 90 tablet 1  . insulin NPH Human (HUMULIN N,NOVOLIN N) 100 UNIT/ML injection Inject 35 Units into the skin 2 (two) times daily before a meal.    . insulin regular (NOVOLIN R,HUMULIN R) 100 units/mL injection Inject 35 Units into the skin 2 (two) times daily before a meal.    . lisinopril-hydrochlorothiazide (PRINZIDE,ZESTORETIC) 20-12.5 MG tablet Take 1 tablet by mouth daily. 90 tablet 1   Facility-Administered Medications Prior to Visit  Medication Dose Route Frequency Provider Last Rate Last Dose  . testosterone cypionate (DEPOTESTOSTERONE CYPIONATE) injection 150 mg  150 mg Intramuscular Q14 Days Bedsole, Amy E, MD   150 mg at 09/22/17 4536     Per HPI unless specifically indicated in ROS section below Review of Systems     Objective:    BP 120/66 (BP Location: Right Arm, Patient Position: Sitting, Cuff Size: Normal)   Pulse 75   Temp 98 F (36.7 C) (Oral)   Ht 5\' 11"  (1.803 m)   Wt 226 lb 8 oz (102.7 kg)   SpO2 98%   BMI 31.59 kg/m   Wt Readings from Last 3 Encounters:  10/14/17 226 lb 8 oz (102.7 kg)  09/27/17 219 lb (99.3 kg)  09/23/17 221 lb 3.2 oz (100.3 kg)    Physical Exam  Constitutional: He appears well-developed and well-nourished. No distress.  HENT:  Mouth/Throat: Oropharynx is clear and moist. No oropharyngeal exudate.  Eyes: Pupils are equal, round, and reactive to light. Conjunctivae and EOM are normal.  Neck: Normal range of motion. Neck supple. No thyromegaly present.  Cardiovascular: Normal rate, regular rhythm and normal heart sounds.  No murmur heard. Pulmonary/Chest: Effort normal and breath sounds normal. No respiratory distress. He has no wheezes.    Musculoskeletal: He exhibits no edema.  Lymphadenopathy:    He has no cervical adenopathy.  Skin: Skin is warm and dry. No rash noted.  Covered skin tear L elbow Abrasions to anterior R lower leg at shin  Psychiatric: He has a normal mood and affect.  Nursing note and vitals reviewed.   Lab Results  Component Value Date   HGBA1C 12.5 (A) 10/14/2017       Assessment & Plan:   Problem List Items Addressed This Visit    Uncontrolled type 2 diabetes mellitus with peripheral neuropathy ([place]) - Primary (Chronic)  Highly fluctuating blood sugars since starting hemp oil last week, with some inconsistent insulin use, now with some falls attributable to hypoglycemia, without hypoglycemic awareness.  I cannot explain his pre-dinner hypoglycemia to 47 when he didn't take any insulin that day. Will start by decreasing insulin to Novolin N 25u and Novolin R 20u with 2 meals a day. Advised hold Novolin R when cbg <150.  Has not seen endo since 11/2016 - advised call to schedule appt and keep log to review with Dr Cruzita Lederer.      Relevant Medications   insulin NPH Human (HUMULIN N,NOVOLIN N) 100 UNIT/ML injection   insulin regular (NOVOLIN R,HUMULIN R) 100 units/mL injection   lisinopril (PRINIVIL,ZESTRIL) 10 MG tablet   Other Relevant Orders   POCT glycosylated hemoglobin (Hb A1C) (Completed)   Falls    Attributable to recent hypoglycemia likely related to hemp oil use - see below.       Essential hypertension (Chronic)    Pt has been off all antihypertensives since he started hemp oil, attributes hemp oil to better control. Advised restart lisinopril 10mg  daily - will monitor only on this.       Relevant Medications   lisinopril (PRINIVIL,ZESTRIL) 10 MG tablet       Meds ordered this encounter  Medications  . lisinopril (PRINIVIL,ZESTRIL) 10 MG tablet    Sig: Take 1 tablet (10 mg total) by mouth daily.    Dispense:  30 tablet    Refill:  3    For pill packing - to use in place of  lisinopril/hctz and atenolol.   Orders Placed This Encounter  Procedures  . POCT glycosylated hemoglobin (Hb A1C)    Follow up plan: No follow-ups on file.  Ria Bush, MD

## 2017-10-14 NOTE — Telephone Encounter (Signed)
I spoke with pt and he has cut back his insulin some but this is not the first time happened so pt scheduled appt with Dr Darnell Level 10/14/17 at 12:15.

## 2017-10-14 NOTE — Telephone Encounter (Signed)
PLEASE NOTE: All timestamps contained within this report are represented as [place] Standard Time. CONFIDENTIALTY NOTICE: This fax transmission is intended only for the addressee. It contains information that is legally privileged, confidential or otherwise protected from use or disclosure. If you are not the intended recipient, you are strictly prohibited from reviewing, disclosing, copying using or disseminating any of this information or taking any action in reliance on or regarding this information. If you have received this fax in error, please notify us immediately by telephone so that we can arrange for its return to [place]. Phone: [phone], Toll-Free: [phone], Fax: [phone] Page: 1 of 2 Call Id: [id number] [place] Patient Name: [name] Gender: Male DOB: 1945/05/24 Age: 72 Y 11 M 10 D Return Phone Number: [phone] (Primary) Address: City/State/Zip: [place] [address] Client [facility] Night - Client Client Site [place] Physician AA - PHYSICIAN, Verita Schneiders- MD Contact Type Call Who Is Calling Patient / Member / Family / Caregiver Call Type Triage / Clinical Caller Name Stanton Kidney Relationship To Patient Grandchild Return Phone Number [phone] (Primary) Chief Complaint Walking difficulty Reason for Call Symptomatic / Request for [address] states her grandfather has been falling for the last week and his blood sugar keeps getting really low and then real high. Additional Comment Unknown actual dob. thinks it is [birth year] Translation No Nurse Assessment Nurse: Allene Dillon, RN, Tabatha Date/Time (Eastern Time): 10/13/2017 8:16:22 AM Confirm and document reason for call. If symptomatic, describe symptoms. ---Caller states her grandfather has been falling for the last week and his blood  sugar keeps getting really low and then real high. Does the patient have any new or worsening symptoms? ---Yes Will a triage be completed? ---Yes Related visit to physician within the last 2 weeks? ---No Does the PT have any chronic conditions? (i.e. diabetes, asthma, etc.) ---Yes List chronic conditions. ---hypertension, diabetes, depression Is this a behavioral health or substance abuse call? ---No Guidelines Guideline Title Affirmed Question Affirmed Notes Nurse Date/Time (Eastern Time) Weakness (Generalized) and Fatigue [1] SEVERE weakness (i.e., unable to walk or barely able to walk, requires support) AND [2] new onset or worsening Allene Dillon, RN, Tabatha 10/13/2017 8:16:49 AM Disp. Time Eilene Ghazi Time) Disposition Final User 10/13/2017 8:25:28 AM 911 Outcome Documentation Burrell, RN, Tabatha Reason: declined ems PLEASE NOTE: All timestamps contained within this report are represented as [place] Standard Time. CONFIDENTIALTY NOTICE: This fax transmission is intended only for the addressee. It contains information that is legally privileged, confidential or otherwise protected from use or disclosure. If you are not the intended recipient, you are strictly prohibited from reviewing, disclosing, copying using or disseminating any of this information or taking any action in reliance on or regarding this information. If you have received this fax in error, please notify us immediately by telephone so that we can arrange for its return to [place]. Phone: [phone], Toll-Free: [phone], Fax: [phone] Page: 2 of 2 Call Id: [id number] 10/13/2017 8:23:58 AM Call EMS 911 Now Yes Allene Dillon, RN, Cassandria Santee Caller Disagree/Comply Disagree Caller Understands Yes PreDisposition InappropriateToAsk Care Advice Given Per Guideline CALL EMS 911 NOW: CARE ADVICE given per Weakness and Fatigue (Adult) guideline. Referrals GO TO FACILITY UNDECIDED

## 2017-10-14 NOTE — Assessment & Plan Note (Signed)
Attributable to recent hypoglycemia likely related to hemp oil use - see below.

## 2017-10-14 NOTE — Assessment & Plan Note (Addendum)
Highly fluctuating blood sugars since starting hemp oil last week, with some inconsistent insulin use, now with some falls attributable to hypoglycemia, without hypoglycemic awareness.  I cannot explain his pre-dinner hypoglycemia to 47 when he didn't take any insulin that day. Will start by decreasing insulin to Novolin N 25u and Novolin R 20u with 2 meals a day. Advised hold Novolin R when cbg <150.  Has not seen endo since 11/2016 - advised call to schedule appt and keep log to review with Dr Cruzita Lederer.

## 2017-10-14 NOTE — Patient Instructions (Addendum)
I do think hemp oil is contributing to recent changes.  Let's decrease insulin dose - to 25 units of Novolin N with your 2 meals a day, and decrease Novolin R to 20 units with your 2 meals. Novolin R is the rapid acting that may lead to low sugars more likely than N. Limit N to only twice a day.  Call Dr Cruzita Lederer to schedule diabetes follow up.  A1c today.

## 2017-10-14 NOTE — Assessment & Plan Note (Signed)
Pt has been off all antihypertensives since he started hemp oil, attributes hemp oil to better control. Advised restart lisinopril 10mg  daily - will monitor only on this.

## 2017-10-20 ENCOUNTER — Encounter: Payer: Self-pay | Admitting: *Deleted

## 2017-10-20 ENCOUNTER — Ambulatory Visit (INDEPENDENT_AMBULATORY_CARE_PROVIDER_SITE_OTHER): Payer: Medicare Other | Admitting: *Deleted

## 2017-10-20 DIAGNOSIS — E349 Endocrine disorder, unspecified: Secondary | ICD-10-CM

## 2017-10-20 DIAGNOSIS — E291 Testicular hypofunction: Secondary | ICD-10-CM | POA: Diagnosis not present

## 2017-10-20 MED ORDER — TESTOSTERONE CYPIONATE 200 MG/ML IM SOLN: 150.0000 mg | Freq: Once | INTRAMUSCULAR | Status: DC

## 2017-10-20 NOTE — Progress Notes (Signed)
Per orders of Dr.Bedsole, injection of Testosterone given by Lauralyn Primes. Patient tolerated injection well.

## 2017-10-24 ENCOUNTER — Encounter: Payer: Medicare Other | Attending: Physician Assistant | Admitting: Physician Assistant

## 2017-10-24 DIAGNOSIS — N183 Chronic kidney disease, stage 3 (moderate): Secondary | ICD-10-CM | POA: Insufficient documentation

## 2017-10-24 DIAGNOSIS — E11621 Type 2 diabetes mellitus with foot ulcer: Secondary | ICD-10-CM | POA: Diagnosis not present

## 2017-10-24 DIAGNOSIS — Z794 Long term (current) use of insulin: Secondary | ICD-10-CM | POA: Insufficient documentation

## 2017-10-24 DIAGNOSIS — Z87891 Personal history of nicotine dependence: Secondary | ICD-10-CM | POA: Diagnosis not present

## 2017-10-24 DIAGNOSIS — L97521 Non-pressure chronic ulcer of other part of left foot limited to breakdown of skin: Secondary | ICD-10-CM | POA: Diagnosis not present

## 2017-10-24 DIAGNOSIS — G4730 Sleep apnea, unspecified: Secondary | ICD-10-CM | POA: Diagnosis not present

## 2017-10-24 DIAGNOSIS — E1140 Type 2 diabetes mellitus with diabetic neuropathy, unspecified: Secondary | ICD-10-CM | POA: Insufficient documentation

## 2017-10-24 DIAGNOSIS — E1122 Type 2 diabetes mellitus with diabetic chronic kidney disease: Secondary | ICD-10-CM | POA: Insufficient documentation

## 2017-10-24 DIAGNOSIS — I129 Hypertensive chronic kidney disease with stage 1 through stage 4 chronic kidney disease, or unspecified chronic kidney disease: Secondary | ICD-10-CM | POA: Insufficient documentation

## 2017-10-24 DIAGNOSIS — Z8673 Personal history of transient ischemic attack (TIA), and cerebral infarction without residual deficits: Secondary | ICD-10-CM | POA: Insufficient documentation

## 2017-10-24 DIAGNOSIS — L97529 Non-pressure chronic ulcer of other part of left foot with unspecified severity: Secondary | ICD-10-CM | POA: Diagnosis not present

## 2017-10-24 DIAGNOSIS — L299 Pruritus, unspecified: Secondary | ICD-10-CM | POA: Diagnosis not present

## 2017-10-25 NOTE — Progress Notes (Signed)
JOSHA, WEEKLEY ([id number]) Visit Report for 10/24/2017 Abuse/Suicide Risk Screen Details Patient Name: [name], [name] Date of Service: 10/24/2017 8:00 AM Medical Record Number: [id number] Patient Account Number: [id number] Date of Birth/Sex: [birth year] (71 y.o. M) Treating RN: Ahmed Prima Primary Care Kathya Wilz: [name] Other Clinician: Referring Cornelius Marullo: Referral, Self Treating Norabelle Kondo/Extender: [name], HOYT Weeks in Treatment: 0 Abuse/Suicide Risk Screen Items Answer ABUSE/SUICIDE RISK SCREEN: Has anyone close to you tried to hurt or harm you recentlyo No Do you feel uncomfortable with anyone in your familyo No Has anyone forced you do things that you didnot want to doo No Do you have any thoughts of harming yourselfo No Patient displays signs or symptoms of abuse and/or neglect. No Electronic Signature(s) Signed: 10/24/2017 5:41:08 PM By: Alric Quan Entered By: Alric Quan on 10/24/2017 08:11:50 [name] ([id number]) -------------------------------------------------------------------------------- Activities of Daily Living Details Patient Name: [name] Date of Service: 10/24/2017 8:00 AM Medical Record Number: [id number] Patient Account Number: [id number] Date of Birth/Sex: 09/29/1945 (71 y.o. M) Treating RN: Ahmed Prima Primary Care Isiaha Greenup: [name] Other Clinician: Referring Almeda Ezra: Referral, Self Treating Irfan Veal/Extender: [name], HOYT Weeks in Treatment: 0 Activities of Daily Living Items Answer Activities of Daily Living (Please select one for each item) Drive Automobile Completely Able Take Medications Completely Able Use Telephone Completely Able Care for Appearance Completely Able Use Toilet Completely Able Bath / Shower Completely Able Dress Self Completely Able Feed Self Completely Able Walk Completely Able Get In / Out Bed Completely Able Housework Completely Able Prepare Meals Completely  [place] for Self Completely Able Electronic Signature(s) Signed: 10/24/2017 5:41:08 PM By: Alric Quan Entered By: Alric Quan on 10/24/2017 08:12:14 [name] ([id number]) -------------------------------------------------------------------------------- Education Assessment Details Patient Name: [name] Date of Service: 10/24/2017 8:00 AM Medical Record Number: [id number] Patient Account Number: [id number] Date of Birth/Sex: 1946-01-11 (71 y.o. M) Treating RN: Ahmed Prima Primary Care Slyvia Lartigue: [name] Other Clinician: Referring Shakyla Nolley: Referral, Self Treating Kalianna Verbeke/Extender: Melburn Hake, HOYT Weeks in Treatment: 0 Primary Learner Assessed: Patient Learning Preferences/Education Level/Primary Language Learning Preference: Explanation, Printed Material Highest Education Level: High School Preferred Language: English Cognitive Barrier Assessment/Beliefs Language Barrier: No Translator Needed: No Memory Deficit: No Emotional Barrier: No Cultural/Religious Beliefs Affecting Medical Care: No Physical Barrier Assessment Impaired Vision: Yes Glasses Impaired Hearing: No Decreased Hand dexterity: No Knowledge/Comprehension Assessment Knowledge Level: Medium Comprehension Level: Medium Ability to understand written Medium instructions: Ability to understand verbal Medium instructions: Motivation Assessment Anxiety Level: Calm Cooperation: Cooperative Education Importance: Acknowledges Need Interest in Health Problems: Asks Questions Perception: Coherent Willingness to Engage in Self- Medium Management Activities: Readiness to Engage in Self- Medium Management Activities: Electronic Signature(s) Signed: 10/24/2017 5:41:08 PM By: Alric Quan Entered By: Alric Quan on 10/24/2017 08:12:37 [name]  ([id number]) -------------------------------------------------------------------------------- Fall Risk Assessment Details Patient Name: [name] Date of Service: 10/24/2017 8:00 AM Medical Record Number: [id number] Patient Account Number: [id number] Date of Birth/Sex: 1945-12-05 (71 y.o. M) Treating RN: Ahmed Prima Primary Care Samar Venneman: [name] Other Clinician: Referring Ernie Kasler: Referral, Self Treating Najae Rathert/Extender: [name], HOYT Weeks in Treatment: 0 Fall Risk Assessment Items Have you had 2 or more falls in the last 12 monthso 0 Yes Have you had any fall that resulted in injury in the last 12 monthso 0 No FALL RISK ASSESSMENT: History of falling - immediate or within 3 months 0 No Secondary diagnosis 15 Yes Ambulatory aid None/bed rest/wheelchair/nurse 0 No Crutches/cane/walker 0 No  Furniture 0 No IV Access/Saline Lock 0 No Gait/Training Normal/bed rest/immobile 0 No Weak 0 No Impaired 0 No Mental Status Oriented to own ability 0 Yes Electronic Signature(s) Signed: 10/24/2017 5:41:08 PM By: Alric Quan Entered By: Alric Quan on 10/24/2017 08:13:03 [name] ([id number]) -------------------------------------------------------------------------------- Foot Assessment Details Patient Name: [name] Date of Service: 10/24/2017 8:00 AM Medical Record Number: [id number] Patient Account Number: [id number] Date of Birth/Sex: 03-13-1946 (71 y.o. M) Treating RN: Ahmed Prima Primary Care Odilia Damico: [name] Other Clinician: Referring Ginamarie Banfield: Referral, Self Treating Deneen Slager/Extender: [name], HOYT Weeks in Treatment: 0 Foot Assessment Items Site Locations + = Sensation present, - = Sensation absent, C = Callus, U = Ulcer R = Redness, W = Warmth, M = Maceration, PU = Pre-ulcerative lesion F = Fissure, S = Swelling, D = Dryness Assessment Right: Left: Other Deformity: No No Prior Foot Ulcer: No No Prior  Amputation: No No Charcot Joint: No No Ambulatory Status: Ambulatory Without Help Gait: Steady Electronic Signature(s) Signed: 10/24/2017 5:41:08 PM By: Alric Quan Entered By: Alric Quan on 10/24/2017 08:14:06 [name] ([id number]) -------------------------------------------------------------------------------- Nutrition Risk Assessment Details Patient Name: [name] Date of Service: 10/24/2017 8:00 AM Medical Record Number: [id number] Patient Account Number: [id number] Date of Birth/Sex: 1945/08/19 (71 y.o. M) Treating RN: Ahmed Prima Primary Care Elisabet Gutzmer: [name] Other Clinician: Referring Orazio Weller: Referral, Self Treating Lynnett Langlinais/Extender: [name], HOYT Weeks in Treatment: 0 Height (in): 71 Weight (lbs): 225 Body Mass Index (BMI): 31.4 Nutrition Risk Assessment Items NUTRITION RISK SCREEN: I have an illness or condition that made me change the kind and/or amount of 0 No food I eat I eat fewer than two meals per day 0 No I eat few fruits and vegetables, or milk products 0 No I have three or more drinks of beer, liquor or wine almost every day 0 No I have tooth or mouth problems that make it hard for me to eat 0 No I don't always have enough money to buy the food I need 0 No I eat alone most of the time 0 No I take three or more different prescribed or over-the-counter drugs a day 1 Yes Without wanting to, I have lost or gained 10 pounds in the last six months 0 No I am not always physically able to shop, cook and/or feed myself 0 No Nutrition Protocols Good Risk Protocol Moderate Risk Protocol Electronic Signature(s) Signed: 10/24/2017 5:41:08 PM By: Alric Quan Entered By: Alric Quan on 10/24/2017 08:13:11

## 2017-10-25 NOTE — Progress Notes (Signed)
[name], [name] ([id number]) Visit Report for 10/24/2017 Chief Complaint Document Details Patient Name: [name], [name] Date of Service: 10/24/2017 8:00 AM Medical Record Number: [id number] Patient Account Number: [id number] Date of Birth/Sex: 08-20-1945 (72 y.o. M) Treating RN: Roger Shelter Primary Care Provider: [name] Other Clinician: Referring Provider: Referral, Self Treating Provider/Extender: Melburn Hake, Raliyah Montella Weeks in Treatment: 0 Information Obtained from: Patient Chief Complaint Left dorsal foot ulcer Electronic Signature(s) Signed: 10/25/2017 7:38:16 AM By: Worthy Keeler [name] Entered By: Worthy Keeler on 10/24/2017 08:23:31 [name] ([id number]) -------------------------------------------------------------------------------- HPI Details Patient Name: [name] Date of Service: 10/24/2017 8:00 AM Medical Record Number: [id number] Patient Account Number: [id number] Date of Birth/Sex: Nov 01, 1945 (71 y.o. M) Treating RN: Roger Shelter Primary Care Provider: [name] Other Clinician: Referring Provider: Referral, Self Treating Provider/Extender: Melburn Hake, Vidyuth Belsito Weeks in Treatment: 0 History of Present Illness HPI Description: 10/04/16 on evaluation today patient presents for initial evaluation concerning a laceration of the right wrist and hand on the bowler aspect. He tells me that he fell into glass following a medication change in regard to his diabetes. He initially went to [facility] where she was sutured and subsequently his daughter who is a Marine scientist removed the teachers at the appropriate time. Unfortunately the wound has done poorly and dehissed which has caused him some trouble including infection. He has been on antibiotics both topically and orally though the wound continues to show signs of necrotic tissue according to notes which is what he was sent to [place] for evaluation concerning. He does have diabetes and unfortunately  this is severely uncontrolled. He does see an endocrinologist and is working on this. 10/11/2016 -- the patient's notes from the ER were reviewed and he had his suturing done on 09/17/2016 and had necrotic skin flaps which had to be trimmed the last visit he was here. He has managed to get his Santyl ointment and has been using this appropriately. 10/21/16 patient's wound appears to be doing very well on evaluation today. He still has some Slough covering the wound bed but this is nowhere near as severe as when i saw this initially two weeks ago nor even my review of his pictures last week. I'm pleased with how this is progressing. Patient's wound over the right form appears to be doing fairly well on evaluation today. There is no evidence of infection and he has a small area at the crease of where she is wrist extends and flexes that is still open and appears to be having difficulty due to the fact that it is cracking open. I think this is something that may be controlled with moisture control that is keeping the wound a little bit more moist. Nonetheless this is a tiny region compared to the initial wound and overall appears to be doing very well. No fevers, chills, nausea, or vomiting noted at this time. 11/25/16 on evaluation today patient appears to be doing well in regard to his right wrist ulcer. In fact this appears to be completely healed he is having no discomfort. He is pleased with how this is progressed. Readmission: 06/24/17 on evaluation today patient appears back in our clinic regarding issues that he has been having with his right ankle on the lateral malleolus as well is in the dorsal surface of his left foot. Both have been going on for several weeks unfortunately. He tells me that initially this was being managed by Dr. Edilia Bo and I did review the notes  from Dr. Edilia Bo in epic. Patient was placed on amoxicillin initially and then subsequently Keflex following. He has been  using triple antibiotic ointment over-the-counter along with a Band-Aid for the wound currently. He did not have any Santyl remaining. His most recent hemoglobin A1c which was just two days ago was 10.3. Fortunately he seems to have excellent blood flow in the bilateral lower extremities with his left ABI being 0.95 in the right ABI 1.01. Patient does have some pain in regard to each of these ulcerations but he tells me it's not nearly as severe as the hand wound that I help treat earlier over the summer. This was in 2018. 07/15/17 on evaluation today patient's lower surety ulcer is actually both appear to be doing fairly well at this point. He does have a little bit of skin overlapping the ulcer on the right lateral ankle this was debrided away today just with saline and gauze without complication patient had no discomfort or pain. Otherwise patient's left foot ulcer does appear to be showing signs of improvement he does seem to have a right great toe. Nikki and noted at this point in time. I think that this does need to be managed at this point. No fevers, chills, nausea, or vomiting noted at this time. 07/22/17 on evaluation today patient's wounds in regard to his lower extremities appear to be doing somewhat better. His right ankle wound in fact appears to likely be completely closed. The left dorsal foot ulcer though still open does appear to be a little bit smaller and does also seem to be making good progress. He has not been having problems with the treatment at this point in the general he does not seem to show as much erythema surrounding the wound bed which is good news. He is not [name], Dustin E. ([id number]) having significant discomfort. 08/12/17 on evaluation today patients wounds of both appeared to be doing better in fact they both appear to be completely healed. Overall I'm very pleased with how things have progressed over the past week even more than what I expected. Obviously this  is good news and he is happy about this. He did have a fall when going to his orthopedic doctor recently fortunately he does not appear to have injured anything severely but nonetheless does have a couple of bumps and bruises nothing significant at this time but he needs to be seen and wound care for. He may have broken his left wrist he is following up with orthopedics in that regard. Readmission: 10/24/17 on evaluation today patient presents for reevaluation after having been discharged Aug 12, 2017. His wound was healed at that point although he states that it has been somewhat discolored since that time. He has not experienced the drainage as far as the wound is concerned but the fact that it was still discolored been greater than two months after the fact made him want to come in for reevaluation. Upon inspection today the patient has no pain although he does have neuropathy. He does have a discolored region of the dorsal surface of the left foot which was the site where the ulcer was that we previously treated. The good news is he does not again have any discomfort although obviously I do believe this may be some scar tissue and just discoloration in that regard. Again he hasn't had any drainage or discharge since I last saw him and at this point I see no evidence of that either this area shows complete  epithelialization. In general I do not feel like there's any significant issue at this point in that regard. Electronic Signature(s) Signed: 10/25/2017 7:38:16 AM By: Worthy Keeler [name] Entered By: Worthy Keeler on 10/24/2017 08:57:17 [name] ([id number]) -------------------------------------------------------------------------------- Physical Exam Details Patient Name: [name] Date of Service: 10/24/2017 8:00 AM Medical Record Number: [id number] Patient Account Number: [id number] Date of Birth/Sex: 1945/07/23 (71 y.o. M) Treating RN: Roger Shelter Primary Care  Provider: [name] Other Clinician: Referring Provider: Referral, Self Treating Provider/Extender: [name], Audry Kauzlarich Weeks in Treatment: 0 Constitutional sitting or standing blood pressure is within target range for patient.. pulse regular and within target range for patient.[name] Kitchen respirations regular, non-labored and within target range for patient.[name] Kitchen temperature within target range for patient.. Well- nourished and well-hydrated in no acute distress. Eyes conjunctiva clear no eyelid edema noted. pupils equal round and reactive to light and accommodation. Ears, Nose, Mouth, and Throat no gross abnormality of ear auricles or external auditory canals. normal hearing noted during conversation. mucus membranes moist. Respiratory normal breathing without difficulty. clear to auscultation bilaterally. Cardiovascular regular rate and rhythm with normal S1, S2. 2+ dorsalis pedis/posterior tibialis pulses. no clubbing, cyanosis, significant edema, <3 sec cap refill. Gastrointestinal (GI) soft, non-tender, non-distended, +BS. no ventral hernia noted. Musculoskeletal normal gait and posture. no significant deformity or arthritic changes, no loss or range of motion, no clubbing. Psychiatric this patient is able to make decisions and demonstrates good insight into disease process. Alert and Oriented x 3. pleasant and cooperative. Notes Patient's wound at this point in general seems to show signs of good improvement still I see nothing that seems to be any worse from the standpoint of a reopening of the ulceration which is good news. Overall I think that he has been taking care of this well there does not appear to be any friction or sheer injury which also is good news. In general I'm really not too concerned about the ulcer site today. There is a little bit of irritation around the area I'm gonna recommend likely a little bit of a steroid cream for him. Electronic Signature(s) Signed: 10/25/2017  7:38:16 AM By: Worthy Keeler [name] Entered By: Worthy Keeler on 10/24/2017 08:58:05 [name] ([id number]) -------------------------------------------------------------------------------- Physician Orders Details Patient Name: [name] Date of Service: 10/24/2017 8:00 AM Medical Record Number: [id number] Patient Account Number: [id number] Date of Birth/Sex: 1946-04-06 (71 y.o. M) Treating RN: Roger Shelter Primary Care Provider: [name] Other Clinician: Referring Provider: Referral, Self Treating Provider/Extender: [name], Nikai Quest Weeks in Treatment: 0 Verbal / Phone Orders: No Diagnosis Coding ICD-10 Coding Code Description E11.621 Type 2 diabetes mellitus with foot ulcer L97.521 Non-pressure chronic ulcer of other part of left foot limited to breakdown of skin N18.3 Chronic kidney disease, stage 3 (moderate) I10 Essential (primary) hypertension E11.40 Type 2 diabetes mellitus with diabetic neuropathy, unspecified Wound Cleansing o Clean wound with Normal Saline. Skin Barriers/Peri-Wound Care o Triamcinolone Acetonide Ointment (TCA) - apply topically to dorsal left foot red areas Dressing Change Frequency o Change dressing twice daily. Patient Medications Allergies: tetracycline Notifications Medication Indication Start End triamcinolone acetonide 10/24/2017 DOSE topical 0.1 % cream - cream topical applied in a thin film 2 times a day for 2 weeks then discontinue on the affected region Notes Follow up as needed Electronic Signature(s) Signed: 10/24/2017 9:00:25 AM By: Worthy Keeler [name] Entered By: Worthy Keeler on 10/24/2017 09:00:25 [name] ([id number]) -------------------------------------------------------------------------------- Problem List Details Patient Name:  [name] Date of Service: 10/24/2017 8:00 AM Medical Record Number: [id number] Patient Account Number: [id number] Date of Birth/Sex: March 02, 1946 (71 y.o.  M) Treating RN: Roger Shelter Primary Care Provider: [name] Other Clinician: Referring Provider: Referral, Self Treating Provider/Extender: Melburn Hake, Nyla Creason Weeks in Treatment: 0 Active Problems ICD-10 Evaluated Encounter Code Description Active Date Today Diagnosis E11.621 Type 2 diabetes mellitus with foot ulcer 10/24/2017 No Yes L97.521 Non-pressure chronic ulcer of other part of left foot limited to 10/24/2017 No Yes breakdown of skin N18.3 Chronic kidney disease, stage 3 (moderate) 10/24/2017 No Yes I10 Essential (primary) hypertension 10/24/2017 No Yes E11.40 Type 2 diabetes mellitus with diabetic neuropathy, 10/24/2017 No Yes unspecified Inactive Problems Resolved Problems Electronic Signature(s) Signed: 10/25/2017 7:38:16 AM By: Worthy Keeler [name] Entered By: Worthy Keeler on 10/24/2017 08:23:05 [name] ([id number]) -------------------------------------------------------------------------------- Progress Note Details Patient Name: [name] Date of Service: 10/24/2017 8:00 AM Medical Record Number: [id number] Patient Account Number: [id number] Date of Birth/Sex: 1945-10-06 (71 y.o. M) Treating RN: Roger Shelter Primary Care Provider: [name] Other Clinician: Referring Provider: Referral, Self Treating Provider/Extender: Melburn Hake, Shayanna Thatch Weeks in Treatment: 0 Subjective Chief Complaint Information obtained from Patient Left dorsal foot ulcer History of Present Illness (HPI) 10/04/16 on evaluation today patient presents for initial evaluation concerning a laceration of the right wrist and hand on the bowler aspect. He tells me that he fell into glass following a medication change in regard to his diabetes. He initially went to [facility] where she was sutured and subsequently his daughter who is a Marine scientist removed the teachers at the appropriate time. Unfortunately the wound has done poorly and dehissed which has caused him some  trouble including infection. He has been on antibiotics both topically and orally though the wound continues to show signs of necrotic tissue according to notes which is what he was sent to [place] for evaluation concerning. He does have diabetes and unfortunately this is severely uncontrolled. He does see an endocrinologist and is working on this. 10/11/2016 -- the patient's notes from the ER were reviewed and he had his suturing done on 09/17/2016 and had necrotic skin flaps which had to be trimmed the last visit he was here. He has managed to get his Santyl ointment and has been using this appropriately. 10/21/16 patient's wound appears to be doing very well on evaluation today. He still has some Slough covering the wound bed but this is nowhere near as severe as when i saw this initially two weeks ago nor even my review of his pictures last week. I'm pleased with how this is progressing. Patient's wound over the right form appears to be doing fairly well on evaluation today. There is no evidence of infection and he has a small area at the crease of where she is wrist extends and flexes that is still open and appears to be having difficulty due to the fact that it is cracking open. I think this is something that may be controlled with moisture control that is keeping the wound a little bit more moist. Nonetheless this is a tiny region compared to the initial wound and overall appears to be doing very well. No fevers, chills, nausea, or vomiting noted at this time. 11/25/16 on evaluation today patient appears to be doing well in regard to his right wrist ulcer. In fact this appears to be completely healed he is having no discomfort. He is pleased with how this is progressed. Readmission: 06/24/17 on  evaluation today patient appears back in our clinic regarding issues that he has been having with his right ankle on the lateral malleolus as well is in the dorsal surface of his left foot. Both have been  going on for several weeks unfortunately. He tells me that initially this was being managed by Dr. Edilia Bo and I did review the notes from Dr. Edilia Bo in epic. Patient was placed on amoxicillin initially and then subsequently Keflex following. He has been using triple antibiotic ointment over-the-counter along with a Band-Aid for the wound currently. He did not have any Santyl remaining. His most recent hemoglobin A1c which was just two days ago was 10.3. Fortunately he seems to have excellent blood flow in the bilateral lower extremities with his left ABI being 0.95 in the right ABI 1.01. Patient does have some pain in regard to each of these ulcerations but he tells me it's not nearly as severe as the hand wound that I help treat earlier over the summer. This was in 2018. 07/15/17 on evaluation today patient's lower surety ulcer is actually both appear to be doing fairly well at this point. He does have a little bit of skin overlapping the ulcer on the right lateral ankle this was debrided away today just with saline and gauze without complication patient had no discomfort or pain. Otherwise patient's left foot ulcer does appear to be showing signs of improvement he does seem to have a right great toe. Nikki and noted at this point in time. I think that this does need to be [name], SNEE. ([id number]) managed at this point. No fevers, chills, nausea, or vomiting noted at this time. 07/22/17 on evaluation today patient's wounds in regard to his lower extremities appear to be doing somewhat better. His right ankle wound in fact appears to likely be completely closed. The left dorsal foot ulcer though still open does appear to be a little bit smaller and does also seem to be making good progress. He has not been having problems with the treatment at this point in the general he does not seem to show as much erythema surrounding the wound bed which is good news. He is not having significant  discomfort. 08/12/17 on evaluation today patients wounds of both appeared to be doing better in fact they both appear to be completely healed. Overall I'm very pleased with how things have progressed over the past week even more than what I expected. Obviously this is good news and he is happy about this. He did have a fall when going to his orthopedic doctor recently fortunately he does not appear to have injured anything severely but nonetheless does have a couple of bumps and bruises nothing significant at this time but he needs to be seen and wound care for. He may have broken his left wrist he is following up with orthopedics in that regard. Readmission: 10/24/17 on evaluation today patient presents for reevaluation after having been discharged Aug 12, 2017. His wound was healed at that point although he states that it has been somewhat discolored since that time. He has not experienced the drainage as far as the wound is concerned but the fact that it was still discolored been greater than two months after the fact made him want to come in for reevaluation. Upon inspection today the patient has no pain although he does have neuropathy. He does have a discolored region of the dorsal surface of the left foot which was the site where the ulcer  was that we previously treated. The good news is he does not again have any discomfort although obviously I do believe this may be some scar tissue and just discoloration in that regard. Again he hasn't had any drainage or discharge since I last saw him and at this point I see no evidence of that either this area shows complete epithelialization. In general I do not feel like there's any significant issue at this point in that regard. Wound History Patient presents with 1 open wound. Patient has been treating wound in the following manner: santyl. Laboratory tests have not been performed in the last month. Patient reportedly has not tested positive for an  antibiotic resistant organism. Patient reportedly has not tested positive for osteomyelitis. Patient reportedly has not had testing performed to evaluate circulation in the legs. Patient History Information obtained from Patient. Allergies tetracycline Family History Cancer - Siblings, Diabetes - Father, Hypertension - Father, Kidney Disease - Mother, Stroke - Father, No family history of Heart Disease, Lung Disease, Thyroid Problems, Tuberculosis. Social History Former smoker, Marital Status - Widowed, Alcohol Use - Never, Drug Use - No History, Caffeine Use - Moderate. Medical And Surgical History Notes Cardiovascular CVA in 2015 Genitourinary "weak kidneys" - is a patient at [facility]) Constitutional Symptoms (General Health) The patient has no complaints or symptoms. Eyes Complains or has symptoms of Glasses / Contacts. Hematologic/Lymphatic [name], [name] ([id number]) The patient has no complaints or symptoms. Gastrointestinal Denies complaints or symptoms of Frequent diarrhea, Nausea, Vomiting, gerd Genitourinary The patient has no complaints or symptoms. Immunological The patient has no complaints or symptoms. Integumentary (Skin) Complains or has symptoms of Wounds. Oncologic The patient has no complaints or symptoms. Psychiatric depression Objective Constitutional sitting or standing blood pressure is within target range for patient.. pulse regular and within target range for patient.[name] Kitchen respirations regular, non-labored and within target range for patient.[name] Kitchen temperature within target range for patient.. Well- nourished and well-hydrated in no acute distress. Vitals Time Taken: 8:04 AM, Height: 71 in, Source: Stated, Weight: 225 lbs, Source: Measured, BMI: 31.4, Temperature: 98.4 F, Pulse: 79 bpm, Respiratory Rate: 18 breaths/min, Blood Pressure: 134/69 mmHg. Eyes conjunctiva clear no eyelid edema noted. pupils equal round and  reactive to light and accommodation. Ears, Nose, Mouth, and Throat no gross abnormality of ear auricles or external auditory canals. normal hearing noted during conversation. mucus membranes moist. Respiratory normal breathing without difficulty. clear to auscultation bilaterally. Cardiovascular regular rate and rhythm with normal S1, S2. 2+ dorsalis pedis/posterior tibialis pulses. no clubbing, cyanosis, significant edema, Gastrointestinal (GI) soft, non-tender, non-distended, +BS. no ventral hernia noted. Musculoskeletal normal gait and posture. no significant deformity or arthritic changes, no loss or range of motion, no clubbing. Psychiatric this patient is able to make decisions and demonstrates good insight into disease process. Alert and Oriented x 3. pleasant and cooperative. General Notes: Patient's wound at this point in general seems to show signs of good improvement still I see nothing that seems to be any worse from the standpoint of a reopening of the ulceration which is good news. Overall I think that he has [name], [place]. ([id number]) been taking care of this well there does not appear to be any friction or sheer injury which also is good news. In general I'm really not too concerned about the ulcer site today. There is a little bit of irritation around the area I'm gonna recommend likely a little bit of a steroid cream for him. Assessment  Active Problems ICD-10 Type 2 diabetes mellitus with foot ulcer Non-pressure chronic ulcer of other part of left foot limited to breakdown of skin Chronic kidney disease, stage 3 (moderate) Essential (primary) hypertension Type 2 diabetes mellitus with diabetic neuropathy, unspecified Plan Wound Cleansing: Clean wound with Normal Saline. Skin Barriers/Peri-Wound Care: Triamcinolone Acetonide Ointment (TCA) - apply topically to dorsal left foot red areas Dressing Change Frequency: Change dressing twice daily. The following  medication(s) was prescribed: triamcinolone acetonide topical 0.1 % cream cream topical applied in a thin film 2 times a day for 2 weeks then discontinue on the affected region starting 10/24/2017 General Notes: Follow up as needed Currently my opinion would be that he has what I would turn more post inflammatory hyperpigmentation at the site of the wound healing. Nonetheless I am going to give him a little bit of a steroid cream to use over the area due to some irritation that I'm seeing over the dorsal surface of left foot and I did send this to the pharmacy for him. We will subsequently see were things stand at follow-up as needed in the future although I explained that if everything is doing well I don't feel like he needs a specific follow-up appointment. He's in agreement this plan. If anything opens a started training he will definitely let me know. Electronic Signature(s) Signed: 10/25/2017 7:38:16 AM By: Worthy Keeler [name] Entered By: Worthy Keeler on 10/24/2017 09:00:39 [name] ([id number]) -------------------------------------------------------------------------------- ROS/PFSH Details Patient Name: [name] Date of Service: 10/24/2017 8:00 AM Medical Record Number: [id number] Patient Account Number: [id number] Date of Birth/Sex: Sep 08, 1945 (71 y.o. M) Treating RN: Ahmed Prima Primary Care Provider: [name] Other Clinician: Referring Provider: Referral, Self Treating Provider/Extender: [name], [place] Warriner Weeks in Treatment: 0 Information Obtained From Patient Wound History Do you currently have one or more open woundso Yes How many open wounds do you currently haveo 1 How have you been treating your wound(s) until nowo santyl Has your wound(s) ever healed and then re-openedo No Have you had any lab work done in the past montho No Have you tested positive for an antibiotic resistant organism (MRSA, VRE)o No Have you tested positive for  osteomyelitis (bone infection)o No Have you had any tests for circulation on your legso No Eyes Complaints and Symptoms: Positive for: Glasses / Contacts Medical History: Positive for: Cataracts - 2018 Gastrointestinal Complaints and Symptoms: Negative for: Frequent diarrhea; Nausea; Vomiting Review of System Notes: gerd Medical History: Negative for: Cirrhosis ; Colitis; Crohnos; Hepatitis A; Hepatitis B; Hepatitis C Integumentary (Skin) Complaints and Symptoms: Positive for: Wounds Constitutional Symptoms (General Health) Complaints and Symptoms: No Complaints or Symptoms Ear/Nose/Mouth/Throat Medical History: Positive for: Middle ear problems Negative for: Chronic sinus problems/congestion Hematologic/Lymphatic Complaints and Symptoms: No Complaints or Symptoms [name], [name] ([id number]) Medical History: Negative for: Anemia; Hemophilia; Human Immunodeficiency Virus; Lymphedema; Sickle Cell Disease Respiratory Medical History: Positive for: Sleep Apnea Negative for: Aspiration; Asthma; Chronic Obstructive Pulmonary Disease (COPD); Pneumothorax; Tuberculosis Cardiovascular Medical History: Positive for: Hypertension Negative for: Angina; Arrhythmia; Congestive Heart Failure; Coronary Artery Disease; Deep Vein Thrombosis; Hypotension; Myocardial Infarction; Peripheral Arterial Disease; Peripheral Venous Disease; Phlebitis; Vasculitis Past Medical History Notes: CVA in 2015 Endocrine Medical History: Positive for: Type II Diabetes Time with diabetes: 20 yrs Treated with: Insulin Blood sugar tested every day: Yes Tested : TID Blood sugar testing results: Bedtime: 130 Genitourinary Complaints and Symptoms: No Complaints or Symptoms Medical History: Negative for: End Stage Renal Disease Past Medical History  Notes: "weak kidneys" - is a patient at [facility] Immunological Complaints and Symptoms: No Complaints or Symptoms Medical History: Negative for:  Lupus Erythematosus; Raynaudos; Scleroderma Musculoskeletal Medical History: Positive for: Gout - history; Osteoarthritis Negative for: Rheumatoid Arthritis; Osteomyelitis Neurologic Medical History: Positive for: Neuropathy Negative for: Dementia; Quadriplegia; Paraplegia; Seizure Disorder [name], [name] ([id number]) Oncologic Complaints and Symptoms: No Complaints or Symptoms Medical History: Negative for: Received Chemotherapy; Received Radiation Psychiatric Complaints and Symptoms: Review of System Notes: depression HBO Extended History Items Eyes: Ear/Nose/Mouth/Throat: Cataracts Middle ear problems Immunizations Pneumococcal Vaccine: Received Pneumococcal Vaccination: Yes Immunization Notes: up to date Implantable Devices Family and Social History Cancer: Yes - Siblings; Diabetes: Yes - Father; Heart Disease: No; Hypertension: Yes - Father; Kidney Disease: Yes - Mother; Lung Disease: No; Stroke: Yes - Father; Thyroid Problems: No; Tuberculosis: No; Former smoker; Marital Status - Widowed; Alcohol Use: Never; Drug Use: No History; Caffeine Use: Moderate; Financial Concerns: No; Food, Clothing or Shelter Needs: No; Support System Lacking: No; Transportation Concerns: No; Advanced Directives: No; Patient does not want information on Advanced Directives; Do not resuscitate: No; Living Will: No; Medical Power of Attorney: No Electronic Signature(s) Signed: 10/24/2017 5:41:08 PM By: Alric Quan Signed: 10/25/2017 7:38:16 AM By: Worthy Keeler [name] Entered By: Alric Quan on 10/24/2017 08:11:38 [name] ([id number]) -------------------------------------------------------------------------------- [place] Details Patient Name: [name] Date of Service: 10/24/2017 Medical Record Number: [id number] Patient Account Number: [id number] Date of Birth/Sex: 12-Feb-1946 (71 y.o. M) Treating RN: Roger Shelter Primary Care Provider: [name] Other  Clinician: Referring Provider: Referral, Self Treating Provider/Extender: Melburn Hake, Menno Vanbergen Weeks in Treatment: 0 Diagnosis Coding ICD-10 Codes Code Description E11.621 Type 2 diabetes mellitus with foot ulcer L97.521 Non-pressure chronic ulcer of other part of left foot limited to breakdown of skin N18.3 Chronic kidney disease, stage 3 (moderate) I10 Essential (primary) hypertension E11.40 Type 2 diabetes mellitus with diabetic neuropathy, unspecified Facility Procedures CPT4 Code: 62229798 Description: 92119 - WOUND CARE VISIT-LEV 2 EST PT Modifier: Quantity: 1 Physician Procedures CPT4 Code Description: [id number] 44818 - WC PHYS LEVEL 3 - EST PT ICD-10 Diagnosis Description E11.621 Type 2 diabetes mellitus with foot ulcer L97.521 Non-pressure chronic ulcer of other part of left foot limited t N18.3 Chronic kidney disease, stage 3  (moderate) I10 Essential (primary) hypertension Modifier: o breakdown of sk Quantity: 1 in Electronic Signature(s) Signed: 10/25/2017 7:38:16 AM By: Worthy Keeler [name] Entered By: Worthy Keeler on 10/24/2017 09:01:12

## 2017-10-25 NOTE — Progress Notes (Addendum)
MARQ, REBELLO ([id number]) Visit Report for 10/24/2017 Allergy List Details Patient Name: [name], [name] Date of Service: 10/24/2017 8:00 AM Medical Record Number: [id number] Patient Account Number: [id number] Date of Birth/Sex: 1945-06-21 (71 y.o. M) Treating RN: Ahmed Prima Primary Care Cosimo Schertzer: [name] Other Clinician: Referring Evvie Behrmann: Referral, Self Treating Liviah Cake/Extender: [name], HOYT Weeks in Treatment: 0 Allergies Active Allergies tetracycline Allergy Notes Electronic Signature(s) Signed: 10/24/2017 5:41:08 PM By: Alric Quan Entered By: Alric Quan on 10/24/2017 08:06:49 [name] ([id number]) -------------------------------------------------------------------------------- Arrival Information Details Patient Name: [name] Date of Service: 10/24/2017 8:00 AM Medical Record Number: [id number] Patient Account Number: [id number] Date of Birth/Sex: 06/02/1945 (71 y.o. M) Treating RN: Ahmed Prima Primary Care Veria Stradley: [name] Other Clinician: Referring Milanya Sunderland: Referral, Self Treating Timotheus Salm/Extender: [name], HOYT Weeks in Treatment: 0 Visit Information Patient Arrived: Ambulatory Arrival Time: 08:03 Accompanied By: self Transfer Assistance: None Patient Identification Verified: Yes Secondary Verification Process Completed: Yes Patient Requires Transmission-Based No Precautions: Patient Has Alerts: Yes Patient Alerts: DM II History Since Last Visit All ordered tests and consults were completed: No Added or deleted any medications: No Any new allergies or adverse reactions: No Had a fall or experienced change in activities of daily living that may affect risk of falls: No Signs or symptoms of abuse/neglect since last visito No Hospitalized since last visit: No Implantable device outside of the clinic excluding cellular tissue based products placed in the center since last visit: No Electronic  Signature(s) Signed: 10/24/2017 5:41:08 PM By: Alric Quan Entered By: Alric Quan on 10/24/2017 08:04:48 [name] ([id number]) -------------------------------------------------------------------------------- Clinic Level of Care Assessment Details Patient Name: [name] Date of Service: 10/24/2017 8:00 AM Medical Record Number: [id number] Patient Account Number: [id number] Date of Birth/Sex: 09/03/1945 (71 y.o. M) Treating RN: Roger Shelter Primary Care Jeriah Corkum: [name] Other Clinician: Referring Loney Domingo: Referral, Self Treating Estella Malatesta/Extender: [name], HOYT Weeks in Treatment: 0 Clinic Level of Care Assessment Items TOOL 2 Quantity Score X - Use when only an EandM is performed on the INITIAL visit 1 0 ASSESSMENTS - Nursing Assessment / Reassessment []  - General Physical Exam (combine w/ comprehensive assessment (listed just below) when 0 performed on new pt. evals) []  - 0 Comprehensive Assessment (HX, ROS, Risk Assessments, Wounds Hx, etc.) ASSESSMENTS - Wound and Skin Assessment / Reassessment X - Simple Wound Assessment / Reassessment - one wound 1 5 []  - 0 Complex Wound Assessment / Reassessment - multiple wounds []  - 0 Dermatologic / Skin Assessment (not related to wound area) ASSESSMENTS - Ostomy and/or Continence Assessment and Care []  - Incontinence Assessment and Management 0 []  - 0 Ostomy Care Assessment and Management (repouching, etc.) PROCESS - Coordination of Care X - Simple Patient / Family Education for ongoing care 1 15 []  - 0 Complex (extensive) Patient / Family Education for ongoing care []  - 0 Staff obtains Programmer, systems, Records, Test Results / Process Orders []  - 0 Staff telephones HHA, Nursing Homes / Clarify orders / etc []  - 0 Routine Transfer to another Facility (non-emergent condition) []  - 0 Routine Hospital Admission (non-emergent condition) []  - 0 New Admissions / Biomedical engineer / Ordering  NPWT, Apligraf, etc. []  - 0 Emergency Hospital Admission (emergent condition) X- 1 10 Simple Discharge Coordination []  - 0 Complex (extensive) Discharge Coordination PROCESS - Special Needs []  - Pediatric / Minor Patient Management 0 []  - 0 Isolation Patient Management [name], [name]. ([id number]) []  - 0 Hearing / Language / Visual special needs []  -  0 Assessment of Community assistance (transportation, D/C planning, etc.) []  - 0 Additional assistance / Altered mentation []  - 0 Support Surface(s) Assessment (bed, cushion, seat, etc.) INTERVENTIONS - Wound Cleansing / Measurement X - Wound Imaging (photographs - any number of wounds) 1 5 []  - 0 Wound Tracing (instead of photographs) X- 1 5 Simple Wound Measurement - one wound []  - 0 Complex Wound Measurement - multiple wounds X- 1 5 Simple Wound Cleansing - one wound []  - 0 Complex Wound Cleansing - multiple wounds INTERVENTIONS - Wound Dressings []  - Small Wound Dressing one or multiple wounds 0 []  - 0 Medium Wound Dressing one or multiple wounds []  - 0 Large Wound Dressing one or multiple wounds []  - 0 Application of Medications - injection INTERVENTIONS - Miscellaneous []  - External ear exam 0 []  - 0 Specimen Collection (cultures, biopsies, blood, body fluids, etc.) []  - 0 Specimen(s) / Culture(s) sent or taken to Lab for analysis []  - 0 Patient Transfer (multiple staff / Civil Service fast streamer / Similar devices) []  - 0 Simple Staple / Suture removal (25 or less) []  - 0 Complex Staple / Suture removal (26 or more) []  - 0 Hypo / Hyperglycemic Management (close monitor of Blood Glucose) []  - 0 Ankle / Brachial Index (ABI) - do not check if billed separately Has the patient been seen at the hospital within the last three years: Yes Total Score: 45 Level Of Care: New/Established - Level 2 Electronic Signature(s) Signed: 10/24/2017 5:44:59 PM By: Roger Shelter Entered By: Roger Shelter on 10/24/2017  08:32:39 [name] ([id number]) -------------------------------------------------------------------------------- Encounter Discharge Information Details Patient Name: [name] Date of Service: 10/24/2017 8:00 AM Medical Record Number: [id number] Patient Account Number: [id number] Date of Birth/Sex: 12-Nov-1945 (71 y.o. M) Treating RN: Roger Shelter Primary Care Avanelle Pixley: [name] Other Clinician: Referring Versia Mignogna: Referral, Self Treating Railee Bonillas/Extender: Melburn Hake, HOYT Weeks in Treatment: 0 Encounter Discharge Information Items Discharge Condition: Stable Ambulatory Status: Ambulatory Discharge Destination: Home Transportation: Other Schedule Follow-up Appointment: Yes Clinical Summary of Care: Electronic Signature(s) Signed: 10/24/2017 5:44:59 PM By: Roger Shelter Entered By: Roger Shelter on 10/24/2017 08:37:15 [name] ([id number]) -------------------------------------------------------------------------------- Lower Extremity Assessment Details Patient Name: [name] Date of Service: 10/24/2017 8:00 AM Medical Record Number: [id number] Patient Account Number: [id number] Date of Birth/Sex: [birth year] (71 y.o. M) Treating RN: Ahmed Prima Primary Care Genelle Economou: [name] Other Clinician: Referring Frenchie Pribyl: Referral, Self Treating Devonda Pequignot/Extender: [name], HOYT Weeks in Treatment: 0 Vascular Assessment Pulses: Dorsalis Pedis Palpable: [Left:Yes] [Right:Yes] Doppler Audible: [Left:Yes] [Right:Yes] Posterior Tibial Palpable: [Left:Yes] [Right:Yes] Doppler Audible: [Left:Yes] [Right:Yes] Extremity colors, hair growth, and conditions: Extremity Color: [Left:Normal] [Right:Normal] Temperature of Extremity: [Left:Warm] [Right:Warm] Capillary Refill: [Left:< 3 seconds] [Right:< 3 seconds] Blood Pressure: Brachial: [Left:140] [Right:160] Dorsalis Pedis: 144 [Left:Dorsalis Pedis: 150] Ankle: Posterior Tibial:  152 [Left:Posterior Tibial: 162 0.95] [Right:1.01] Toe Nail Assessment Left: Right: Thick: Yes Yes Discolored: Yes Yes Deformed: Yes Yes Improper Length and Hygiene: No No Electronic Signature(s) Signed: 10/24/2017 5:41:08 PM By: Alric Quan Entered By: Alric Quan on 10/24/2017 08:17:58 [name] ([id number]) -------------------------------------------------------------------------------- Multi Wound Chart Details Patient Name: [name] Date of Service: 10/24/2017 8:00 AM Medical Record Number: [id number] Patient Account Number: [id number] Date of Birth/Sex: 06/05/1945 (71 y.o. M) Treating RN: Roger Shelter Primary Care Taven Strite: [name] Other Clinician: Referring Akash Winski: Referral, Self Treating Amarilys Lyles/Extender: [name], HOYT Weeks in Treatment: 0 Vital Signs Height(in): 71 Pulse(bpm): 79 Weight(lbs): 225 Blood Pressure(mmHg): 134/69 Body Mass Index(BMI): 31 Temperature(F):  98.4 Respiratory Rate 18 (breaths/min): Wound Assessments Treatment Notes Electronic Signature(s) Signed: 10/24/2017 5:44:59 PM By: Roger Shelter Entered By: Roger Shelter on 10/24/2017 08:29:37 [name] ([id number]) -------------------------------------------------------------------------------- [place] Details Patient Name: [name] Date of Service: 10/24/2017 8:00 AM Medical Record Number: [id number] Patient Account Number: [id number] Date of Birth/Sex: November 08, 1945 (71 y.o. M) Treating RN: Roger Shelter Primary Care Tomica Arseneault: [name] Other Clinician: Referring Braxley Balandran: Referral, Self Treating Nataleigh Griffin/Extender: Melburn Hake, HOYT Weeks in Treatment: 0 Active Inactive Electronic Signature(s) Signed: 11/11/2017 5:29:40 PM By: Gretta Cool, BSN, RN, CWS, Kim RN, BSN Signed: 11/15/2017 3:58:17 PM By: Roger Shelter Previous Signature: 10/24/2017 5:44:59 PM Version By: Roger Shelter Entered By: Gretta Cool BSN, RN,  CWS, Kim on 11/11/2017 17:29:39 [name], [name] ([id number]) -------------------------------------------------------------------------------- Pain Assessment Details Patient Name: [name] Date of Service: 10/24/2017 8:00 AM Medical Record Number: [id number] Patient Account Number: [id number] Date of Birth/Sex: 1945-06-13 (71 y.o. M) Treating RN: Ahmed Prima Primary Care Finnigan Warriner: [name] Other Clinician: Referring Kortez Murtagh: Referral, Self Treating Amenda Duclos/Extender: [name], HOYT Weeks in Treatment: 0 Active Problems Location of Pain Severity and Description of Pain Patient Has Paino No Site Locations Pain Management and Medication Current Pain Management: Electronic Signature(s) Signed: 10/24/2017 5:41:08 PM By: Alric Quan Entered By: Alric Quan on 10/24/2017 08:04:54 [name] ([id number]) -------------------------------------------------------------------------------- Patient/Caregiver Education Details Patient Name: [name] Date of Service: 10/24/2017 8:00 AM Medical Record Number: [id number] Patient Account Number: [id number] Date of Birth/Gender: June 19, 1945 (71 y.o. M) Treating RN: Roger Shelter Primary Care Physician: [name] Other Clinician: Referring Physician: Referral, Self Treating Physician/Extender: Melburn Hake, HOYT Weeks in Treatment: 0 Education Assessment Education Provided To: Patient Education Topics Provided Wound/Skin Impairment: Handouts: Skin Care Do's and Dont's Methods: Explain/Verbal Responses: State content correctly Electronic Signature(s) Signed: 10/24/2017 5:44:59 PM By: Roger Shelter Entered By: Roger Shelter on 10/24/2017 08:37:27 [name] ([id number]) -------------------------------------------------------------------------------- [place] Details Patient Name: [name] Date of Service: 10/24/2017 8:00 AM Medical Record Number: [id number] Patient Account  Number: [id number] Date of Birth/Sex: 03/20/1946 (71 y.o. M) Treating RN: Ahmed Prima Primary Care Chloeanne Poteet: [name] Other Clinician: Referring Adja Ruff: Referral, Self Treating Braileigh Landenberger/Extender: [name], HOYT Weeks in Treatment: 0 Vital Signs Time Taken: 08:04 Temperature (F): 98.4 Height (in): 71 Pulse (bpm): 79 Source: Stated Respiratory Rate (breaths/min): 18 Weight (lbs): 225 Blood Pressure (mmHg): 134/69 Source: Measured Reference Range: 80 - 120 mg / dl Body Mass Index (BMI): 31.4 Electronic Signature(s) Signed: 10/24/2017 5:41:08 PM By: Alric Quan Entered By: Alric Quan on 10/24/2017 08:06:42

## 2017-10-28 ENCOUNTER — Ambulatory Visit (INDEPENDENT_AMBULATORY_CARE_PROVIDER_SITE_OTHER): Payer: Medicare Other | Admitting: Gastroenterology

## 2017-10-28 ENCOUNTER — Encounter: Payer: Self-pay | Admitting: Gastroenterology

## 2017-10-28 ENCOUNTER — Other Ambulatory Visit: Payer: Self-pay

## 2017-10-28 VITALS — BP 146/77 | HR 85 | Ht 71.0 in | Wt 226.0 lb

## 2017-10-28 DIAGNOSIS — R1310 Dysphagia, unspecified: Secondary | ICD-10-CM | POA: Diagnosis not present

## 2017-10-28 DIAGNOSIS — K219 Gastro-esophageal reflux disease without esophagitis: Secondary | ICD-10-CM | POA: Diagnosis not present

## 2017-10-28 DIAGNOSIS — R1319 Other dysphagia: Secondary | ICD-10-CM

## 2017-10-28 NOTE — Progress Notes (Signed)
Dustin Darby, MD [address]  [place]  [place], [place] [address]  Main: [phone]  Fax: [phone]    Gastroenterology Consultation  Referring Provider:     [name], MD Primary Care Physician:  [name], MD Primary Gastroenterologist:  Dr. Cephas [name] Reason for Consultation:     Dysphagia, heartburn and diarrhea and constipation        HPI:   TRAMON CRESCENZO is a 72 y.o. male referred by Dr. [name], MD  for consultation & management of and dysphagia, heartburn and altered bowel habits  Dysphagia and heartburn: Patient reports a several years history of heartburn including nocturnal symptoms. He has been taking ranitidine and recently switched to omeprazole 20 mg daily. The benefit did not last more than few days. For the last 6 months, approximately since 04/2017 patient has been experiencing sensation of food stuck in his chest. He lost about 20 pounds in last 6 months. He drinks diet soda 6 cans every day. He has 100 pack years of smoking history  Altered bowel habits: His wife passed away in 31-May-2017, it was a sudden death from probable PE. Since then, he has been experiencing alternating episodes of constipation that lasts for 5 days followed by nonbloody diarrhea. He reports abdominal bloating, with incomplete emptying. He is now taking care of 32 year old mother who lives with him.  Follow-up visit 10/28/2017 He underwent EGD for dysphagia, unremarkable for eosinophilic esophagitis other than distal esophageal biopsies showed changes suggestive of reflux esophagitis. I started him on Prilosec 40 mg twice daily. He reports that his dysphagia is improving but continues to have sensation of food stuck in his lower esophagus.  NSAIDs: none  Antiplts/Anticoagulants/Anti thrombotics: Aspirin 81  GI Procedures:   - Normal duodenal bulb and second portion of the duodenum. - Normal stomach. Biopsied. - Normal gastroesophageal junction  and esophagus. Biopsied. DIAGNOSIS:  A. STOMACH; COLD BIOPSY:  - FOCAL MILD CHRONIC GASTRITIS OF OXYNTIC MUCOSA.  - NEGATIVE FOR H. PYLORI, DYSPLASIA AND MALIGNANCY.   B. DISTAL ESOPHAGUS; COLD BIOPSY:  - FEATURES SUGGESTIVE OF REFLUX.   C. PROXIMAL ESOPHAGUS; COLD BIOPSY:  - NO PATHOLOGIC CHANGE.   Colonoscopy in 2011 [facility]COPIC IMPRESSION:     1) Diverticulosis,mild,left sided diverticulosis     2) Three diminutive polyps removed     3) Otherwise normal examination except for small lipoma in     transverse colon.     4) Internal hemorrhoids     5) Personal history of polyps, reported by patient to be adenomas     in 1996 1. Colon, polyp(s), hepatic flexure, transverse and sigmoid colon : - HYPERPLASTIC POLYPS AND POLYPOID FRAGMENT OF BENIGN COLONIC MUCOSA. - NO ADENOMATOUS CHANGES OR MALIGNANCY IDENTIFIED.  Past Medical History:  Diagnosis Date  . Allergic rhinitis due to pollen   . Chronic kidney disease, stage III (moderate) ([place]) 12/19/2012  . Depression   . Diabetes mellitus ([place])   . Diverticulosis   . Erectile dysfunction associated with type 2 diabetes mellitus ([place]) 05/28/2015  . Former very heavy cigarette smoker (more than 40 per day) 10/07/2014  . GERD (gastroesophageal reflux disease)   . Gout   . Headache   . Hyperlipidemia   . Hypertension   . Hypogonadism male 06/14/2012  . Iron deficiency   . Low testosterone   . Memory loss   . Osteoarthritis   . Personal history of colonic polyps    1996    Past  Surgical History:  Procedure Laterality Date  . CARDIAC CATHETERIZATION  11/2011   [facility]  . CHOLECYSTECTOMY  1980  . ESOPHAGOGASTRODUODENOSCOPY (EGD) WITH PROPOFOL N/A 09/27/2017   Procedure: ESOPHAGOGASTRODUODENOSCOPY (EGD) WITH PROPOFOL;  Surgeon: Lin Landsman, MD;  Location: [facility];  Service: Gastroenterology;  Laterality: N/A;  . EYE SURGERY Right 07/29/2016   cataract - Dr. Manuella Ghazi  . EYE SURGERY Left 08/23/2106   cataract -  Dr. Manuella Ghazi  . TOTAL HIP ARTHROPLASTY  2009   Dr. Gladstone Lighter  . TOTAL HIP ARTHROPLASTY      Current Outpatient Medications:  .  aspirin EC 81 MG tablet, Take 81 mg by mouth at bedtime., Disp: , Rfl:  .  buPROPion (WELLBUTRIN SR) 150 MG 12 hr tablet, TAKE 1 TABLET BY MOUTH TWICE A DAY, Disp: 180 tablet, Rfl: 1 .  cholecalciferol (VITAMIN D) 1000 UNITS tablet, Take 1,000 Units by mouth daily.  , Disp: , Rfl:  .  donepezil (ARICEPT) 10 MG tablet, Take 1 tablet (10 mg total) by mouth at bedtime., Disp: 90 tablet, Rfl: 1 .  doxazosin (CARDURA) 8 MG tablet, Take 0.5 tablets (4 mg total) by mouth at bedtime., Disp: 45 tablet, Rfl: 1 .  fluticasone (FLONASE) 50 MCG/ACT nasal spray, INHALE 2 SPRAYS INTO EACH NOSTRIL EVERY DAY AS NEEDED FOR CONGESTION, Disp: 48 g, Rfl: 1 .  ibuprofen (ADVIL,MOTRIN) 200 MG tablet, Take 200-400 mg by mouth every 6 (six) hours as needed for headache (pain)., Disp: , Rfl:  .  insulin NPH Human (HUMULIN N,NOVOLIN N) 100 UNIT/ML injection, Inject 0.25 mLs (25 Units total) into the skin 2 (two) times daily before a meal., Disp: 10 mL, Rfl:  .  insulin regular (NOVOLIN R,HUMULIN R) 100 units/mL injection, Inject 0.2 mLs (20 Units total) into the skin 2 (two) times daily before a meal., Disp: 10 mL, Rfl:  .  lisinopril (PRINIVIL,ZESTRIL) 10 MG tablet, Take 1 tablet (10 mg total) by mouth daily., Disp: 30 tablet, Rfl: 3 .  memantine (NAMENDA) 10 MG tablet, Take 1 tablet (10 mg total) by mouth 2 (two) times daily., Disp: 180 tablet, Rfl: 1 .  nystatin cream (MYCOSTATIN), Apply 1 application topically 2 (two) times daily., Disp: 30 g, Rfl: 0 .  omeprazole (PRILOSEC) 40 MG capsule, Take 1 capsule (40 mg total) by mouth 2 (two) times daily before a meal., Disp: 60 capsule, Rfl: 1 .  simvastatin (ZOCOR) 40 MG tablet, TAKE ONE TABLET BY MOUTH EVERY NIGHT AT BEDTIME, Disp: 90 tablet, Rfl: 3 .  testosterone cypionate (DEPOTESTOSTERONE CYPIONATE) 200 MG/ML injection, INJECT 0.75 MLS (150 MG) INTO  THE MUSCLE EVERY 14 DAYS, Disp: 10 mL, Rfl: 1 .  triamcinolone cream (KENALOG) 0.1 %, , Disp: , Rfl:  .  venlafaxine XR (EFFEXOR-XR) 75 MG 24 hr capsule, TAKE ONE CAPSULE (75 MG) BY MOUTH EVERY MORNING AND TWO CAPSULES (150 MG) AT NIGHT, Disp: 270 capsule, Rfl: 1 .  ranitidine (ZANTAC 75) 75 MG tablet, Take 225 mg by mouth at bedtime. , Disp: , Rfl:  .  sildenafil (REVATIO) 20 MG tablet, Generic Revatio / Sildanefil 20 mg. 2 - 5 tabs 30 mins prior to intercourse. (Patient not taking: Reported on 10/28/2017), Disp: 10 tablet, Rfl: prn  Current Facility-Administered Medications:  .  testosterone cypionate (DEPOTESTOSTERONE CYPIONATE) injection 150 mg, 150 mg, Intramuscular, Q14 Days, Bedsole, Amy E, MD, 150 mg at 10/20/17 1011    Family History  Problem Relation Age of Onset  . Diabetes Mother   . Alzheimer's disease  Mother   . Diabetes Father   . Lung cancer Father        Age 77  . Stroke Father   . Heart attack Father   . Lung cancer Sister        lung  . Heart disease Maternal Grandfather   . COPD Sister   . Heart attack Sister   . Heart disease Sister      Social History   Tobacco Use  . Smoking status: Former Smoker    Packs/day: 3.00    Years: 35.00    Pack years: 105.00    Types: Cigarettes    Last attempt to quit: 04/12/2000    Years since quitting: 17.5  . Smokeless tobacco: Never Used  Substance Use Topics  . Alcohol use: No  . Drug use: No    Allergies as of 10/28/2017 - Review Complete 10/28/2017  Allergen Reaction Noted  . Tetracycline Itching and Rash 06/13/2008    Review of Systems:    All systems reviewed and negative except where noted in HPI.   Physical Exam:  BP (!) 146/77   Pulse 85   Ht 5\' 11"  (1.803 m)   Wt 226 lb (102.5 kg)   BMI 31.52 kg/m  No LMP for male patient.  General:   Alert,  Well-developed, well-nourished, pleasant and cooperative in NAD Head:  Normocephalic and atraumatic. Eyes:  Sclera clear, no icterus.   Conjunctiva  pink. Ears:  Normal auditory acuity. Nose:  No deformity, discharge, or lesions. Mouth:  No deformity or lesions,oropharynx pink & moist. Neck:  Supple; no masses or thyromegaly. Lungs:  Respirations even and unlabored.  Clear throughout to auscultation.   No wheezes, crackles, or rhonchi. No acute distress. Heart:  Regular rate and rhythm; no murmurs, clicks, rubs, or gallops. Abdomen:  Normal bowel sounds. Soft, non-tender and distended, tympanitic without masses, hepatosplenomegaly or hernias noted.  No guarding or rebound tenderness.   Rectal: Not performed Msk:  Symmetrical without gross deformities. Good, equal movement & strength bilaterally. Pulses:  Normal pulses noted. Extremities:  No clubbing or edema.  No cyanosis. Neurologic:  Alert and oriented x3;  grossly normal neurologically. Skin:  Intact without significant lesions or rashes. No jaundice. Psych:  Alert and cooperative. Normal mood and affect.  Imaging Studies: No abdominal imaging  Assessment and Plan:   WALLACE GAPPA is a 72 y.o. Caucasian male with metabolic syndrome, 163 pack years of tobacco, chronic GERD presents with 6 month history of dysphagia  6 Chronic GERD and Dysphagia: symptoms are improving EGD revealed reflux esophagitis on biopsies, negative for EOE continue omeprazole to 40 mg twice daily  Follow up in 3 months   Dustin Darby, MD

## 2017-11-03 ENCOUNTER — Ambulatory Visit: Payer: Medicare Other

## 2017-11-03 ENCOUNTER — Other Ambulatory Visit: Payer: Self-pay | Admitting: Family Medicine

## 2017-11-03 DIAGNOSIS — E291 Testicular hypofunction: Secondary | ICD-10-CM

## 2017-11-03 MED ORDER — TESTOSTERONE CYPIONATE 200 MG/ML IM SOLN: 150.0000 mg | Freq: Once | INTRAMUSCULAR | Status: DC

## 2017-11-03 NOTE — Progress Notes (Signed)
Per orders of Dr. Diona Browner, injection of testosterone cypionaate given by Brenton Grills. Patient tolerated injection well.

## 2017-11-03 NOTE — Telephone Encounter (Signed)
Last filled 05/06/17 10 ml refills 1  Last OV 10/14/17 Last Lab 05/18/17

## 2017-11-04 ENCOUNTER — Telehealth: Payer: Self-pay | Admitting: Gastroenterology

## 2017-11-04 NOTE — Telephone Encounter (Signed)
[facility] [place] [facility] [employer]

## 2017-11-04 NOTE — Telephone Encounter (Signed)
Spoke with pt and he did buy medication OTC

## 2017-11-09 DIAGNOSIS — L700 Acne vulgaris: Secondary | ICD-10-CM | POA: Diagnosis not present

## 2017-11-14 ENCOUNTER — Other Ambulatory Visit: Payer: Self-pay | Admitting: Family Medicine

## 2017-11-14 NOTE — Telephone Encounter (Signed)
Pt calling to request refill of Testosterone injection.Pt [facility] to [facility] [address] [place] [facility] not [facility] the medication in stock and told the pt that a [facility] [address] Abbott [name] states he needs this medication before Thursday due to having a nurse visit for injection.

## 2017-11-14 NOTE — Telephone Encounter (Signed)
Copied from [place] [id number]. Topic: Quick Communication - Rx Refill/Question >> Nov 14, 2017  2:13 PM [name], [name] Kitchen wrote: Medication: testestosterone   Has the patient contacted their pharmacy? Yes.   (Agent: If no, request that the patient contact the pharmacy for the refill.) (Agent: If yes, when and what did the pharmacy advise?)  Preferred Pharmacy (with phone number or street name): [facility] [address]  [facility] to [facility] [address]  [facility] not [facility] medication in [facility] and told pt a new rx needs to be called in.   Cb is [phone]  Pt needs this before Thursday as he has nurse visit for injection    Agent: Please be advised that RX refills may take up to 3 business days. We ask that you follow-up with your pharmacy.

## 2017-11-14 NOTE — Telephone Encounter (Signed)
Last office visit 10/14/2017 with Dr. Darnell Level.  Gabapentin not on current medication list.  Refill?

## 2017-11-15 ENCOUNTER — Other Ambulatory Visit: Payer: Self-pay | Admitting: Family Medicine

## 2017-11-15 MED ORDER — TESTOSTERONE CYPIONATE 200 MG/ML IM SOLN: INTRAMUSCULAR | 1 refills | Status: DC

## 2017-11-16 ENCOUNTER — Other Ambulatory Visit: Payer: Self-pay | Admitting: Gastroenterology

## 2017-11-16 DIAGNOSIS — K219 Gastro-esophageal reflux disease without esophagitis: Secondary | ICD-10-CM

## 2017-11-16 DIAGNOSIS — R1319 Other dysphagia: Secondary | ICD-10-CM

## 2017-11-16 DIAGNOSIS — R1310 Dysphagia, unspecified: Secondary | ICD-10-CM

## 2017-11-17 ENCOUNTER — Telehealth: Payer: Self-pay

## 2017-11-17 ENCOUNTER — Ambulatory Visit (INDEPENDENT_AMBULATORY_CARE_PROVIDER_SITE_OTHER): Payer: Medicare Other

## 2017-11-17 DIAGNOSIS — E291 Testicular hypofunction: Secondary | ICD-10-CM | POA: Diagnosis not present

## 2017-11-17 MED ORDER — TESTOSTERONE CYPIONATE 200 MG/ML IM SOLN
150.0000 mg | INTRAMUSCULAR | Status: DC
  Administered 2017-11-17 – 2017-12-15 (×2): 150 mg via INTRAMUSCULAR

## 2017-11-17 NOTE — Telephone Encounter (Signed)
[facility] said [facility] told pt too early to get testosterone. I called [facility] and spoke with [facility] and he said was getting rx ready for pick up now. Pt voiced understanding.

## 2017-11-29 ENCOUNTER — Ambulatory Visit: Payer: Self-pay | Admitting: Family Medicine

## 2017-11-29 NOTE — Telephone Encounter (Signed)
Pt called with BP 187/113 and 187/112 taken today at 1100. Pt stated he took his BP with an automatic home BP monitor. Pt stated he was sitting with legs uncrossed. Pt stated that he did not take his Cardura last night. Pt denies any symptoms such as headache, chest pain, blurred vision, difficulty breathing or weakness.  Pt stated "I feel fine." Pt given care advice and to go to the ED for any weakness or numbness of the face, arm, leg on one side of the body occurs; any difficulty walking, difficulty talking or severe headache occurs; if he has any chest pain or difficulty breathing occurs or if he becomes worse.  Called [facility] Latin and Mearl Latin was able to make appointment for pt 11/30/17 at 1100 with Dr Glori Bickers.   Reason for Disposition . Systolic BP  >= 022 OR Diastolic >= 336  Answer Assessment - Initial Assessment Questions 1. BLOOD PRESSURE: "What is the blood pressure?" "Did you take at least two measurements 5 minutes apart?"     187/113 187/112 2. ONSET: "When did you take your blood pressure?"     11:00 3. HOW: "How did you obtain the blood pressure?" (e.g., visiting nurse, automatic home BP monitor)     Left arm used automatic home BP monitor 4. HISTORY: "Do you have a history of high blood pressure?"     yes 5. MEDICATIONS: "Are you taking any medications for blood pressure?" "Have you missed any doses recently?"     Yes takes 3 meds- missed cardura last night 6. OTHER SYMPTOMS: "Do you have any symptoms?" (e.g., headache, chest pain, blurred vision, difficulty breathing, weakness)     no 7. PREGNANCY: "Is there any chance you are pregnant?" "When was your last menstrual period?"     n/a  Protocols used: HIGH BLOOD PRESSURE-A-AH

## 2017-11-30 ENCOUNTER — Encounter: Payer: Self-pay | Admitting: Family Medicine

## 2017-11-30 ENCOUNTER — Ambulatory Visit (INDEPENDENT_AMBULATORY_CARE_PROVIDER_SITE_OTHER): Payer: Medicare Other | Admitting: Family Medicine

## 2017-11-30 VITALS — BP 156/62 | HR 118 | Temp 96.7°F | Ht 71.0 in | Wt 222.8 lb

## 2017-11-30 DIAGNOSIS — I10 Essential (primary) hypertension: Secondary | ICD-10-CM

## 2017-11-30 DIAGNOSIS — R000 Tachycardia, unspecified: Secondary | ICD-10-CM

## 2017-11-30 DIAGNOSIS — E669 Obesity, unspecified: Secondary | ICD-10-CM | POA: Diagnosis not present

## 2017-11-30 MED ORDER — ATENOLOL 100 MG PO TABS: 100.0000 mg | ORAL_TABLET | Freq: Every day | ORAL | 0 refills | Status: DC

## 2017-11-30 NOTE — Assessment & Plan Note (Signed)
Regular rhythm -off atenolol and not seemingly anxious Lab Results  Component Value Date   TSH 1.480 06/29/2016   will start back atenolol and f/u with PCP

## 2017-11-30 NOTE — Patient Instructions (Addendum)
Continue what you take for blood pressure (cardura and lisinopril) Add back atenolol 100 mg once daily   Keep an eye on bp and pulse   Follow up with Dr Lorelei Pont as planned   If no improvement -let us know /or if any problems

## 2017-11-30 NOTE — Assessment & Plan Note (Signed)
Enc him to continue walking and watching diet

## 2017-11-30 NOTE — Assessment & Plan Note (Signed)
Labile bp with recent attempt to try hemp oil for bp  Now still elevated on lisinopril and cardura alone  BP: (!) 156/62    Will add back atenolol 100 mg daily -tolerated well in the past  To continue cardura and lisinopril (not hct due to renal insuff) Pulse rate is also high- this may help as well [name] will watch at home  Disc habits for HTN/ DASH eating and exercise  F/u planned with pcp

## 2017-11-30 NOTE — Progress Notes (Signed)
Subjective:    Patient ID: [name], male    DOB: [birth year], 72 y.o.   [name]: [id number]  HPI Here for blood pressure concerns  72 yo pt of Dr Chauncey Cruel Copland with hx of HTN, OSA, DM2, renal insufficiency, and smoking (quit in 2002)  here with bp concerns   Wt Readings from Last 3 Encounters:  11/30/17 222 lb 12 oz (101 kg)  10/28/17 226 lb (102.5 kg)  10/14/17 226 lb 8 oz (102.7 kg)   31.07 kg/m   Note from Dr Darnell Level on 10/14/17 notes that he has been off all antihypertensives since starting hep oil  bp was up  Re started lisinopril 10 mg daily   cardura is still on list 4 mg at bedtime   ? If symptoms from HTN  No headaches but feels a bit off balance  Some 180s/low 100s for the past week at home (cuff may also run low)   BP Readings from Last 3 Encounters:  11/30/17 (!) 156/62  10/28/17 (!) 146/77  10/14/17 120/66   His home bp cuff read 532 systolic-noted  Pulse Readings from Last 3 Encounters:  11/30/17 (!) 118  10/28/17 85  10/14/17 75   Used to be on   atenolol 100 mg once daily (never re started after 2 weeks)  Also lisinopril hct   Lab Results  Component Value Date   CREATININE 1.69 (H) 06/22/2017   BUN 21 06/22/2017   NA 130 (L) 06/22/2017   K 4.5 06/22/2017   CL 92 (L) 06/22/2017   CO2 30 06/22/2017  drinks a lot of water  Watches sodium and processed foods   Lab Results  Component Value Date   HGBA1C 12.5 (A) 10/14/2017    out of control  Sees Dr Lorelei Pont in sept   Exercise - he goes to the mall walking- 3 times per week   Patient Active Problem List   Diagnosis Date Noted  . Falls 10/14/2017  . Esophageal dysphagia   . Abdominal bloating   . Muscle spasm 11/05/2016  . Mild cognitive disorder 06/29/2016  . Uncontrolled type 2 diabetes mellitus with peripheral neuropathy ([place]) 05/11/2016  . Erectile dysfunction associated with type 2 diabetes mellitus ([place]) 05/28/2015  . Former very heavy cigarette smoker (more than 40 per day) 10/07/2014    . Chronic kidney disease, stage III (moderate) ([place]) 12/19/2012  . Hypogonadism male 06/14/2012  . Obesity (BMI 30-39.9) 09/27/2011  . OSA (obstructive sleep apnea) 03/09/2011  . Diabetic neuropathy ([place]) 12/29/2010  . Family history of MI (myocardial infarction) 09/16/2010  . HIP REPLACEMENT, RIGHT, HX OF 02/05/2010  . Hypertriglyceridemia 06/13/2008  . GOUT 06/13/2008  . Major depressive disorder, recurrent episode, in partial remission ([place]) 06/13/2008  . Essential hypertension 06/13/2008  . ALLERGIC RHINITIS 06/13/2008  . GERD 06/13/2008  . OSTEOARTHRITIS 06/13/2008   Past Medical History:  Diagnosis Date  . Allergic rhinitis due to pollen   . Chronic kidney disease, stage III (moderate) ([place]) 12/19/2012  . Depression   . Diabetes mellitus ([place])   . Diverticulosis   . Erectile dysfunction associated with type 2 diabetes mellitus ([place]) 05/28/2015  . Former very heavy cigarette smoker (more than 40 per day) 10/07/2014  . GERD (gastroesophageal reflux disease)   . Gout   . Headache   . Hyperlipidemia   . Hypertension   . Hypogonadism male 06/14/2012  . Iron deficiency   . Low testosterone   . Memory loss   . Osteoarthritis   .  Personal history of colonic polyps    1996   Past Surgical History:  Procedure Laterality Date  . CARDIAC CATHETERIZATION  11/2011   [facility]  . CHOLECYSTECTOMY  1980  . ESOPHAGOGASTRODUODENOSCOPY (EGD) WITH PROPOFOL N/A 09/27/2017   Procedure: ESOPHAGOGASTRODUODENOSCOPY (EGD) WITH PROPOFOL;  Surgeon: Lin Landsman, MD;  Location: [facility];  Service: Gastroenterology;  Laterality: N/A;  . EYE SURGERY Right 07/29/2016   cat[facility] - Dr. Manuella Ghazi  . EYE SURGERY Left 08/23/2106   cataract - Dr. Manuella Ghazi  . TOTAL HIP ARTHROPLASTY  2009   Dr. Gladstone Lighter  . TOTAL HIP ARTHROPLASTY     Social History   Tobacco Use  . Smoking status: Former Smoker    Packs/day: 3.00    Years: 35.00    Pack years: 105.00    Types: Cigarettes    Last attempt to quit:  04/12/2000    Years since quitting: 17.6  . Smokeless tobacco: Never Used  Substance Use Topics  . Alcohol use: No  . Drug use: No   Family History  Problem Relation Age of Onset  . Diabetes Mother   . Alzheimer's disease Mother   . Diabetes Father   . Lung cancer Father        Age 28  . Stroke Father   . Heart attack Father   . Lung cancer Sister        lung  . Heart disease Maternal Grandfather   . COPD Sister   . Heart attack Sister   . Heart disease Sister    Allergies  Allergen Reactions  . Tetracycline Itching and Rash   Current Outpatient Medications on File Prior to Visit  Medication Sig Dispense Refill  . aspirin EC 81 MG tablet Take 81 mg by mouth at bedtime.    [name] Kitchen buPROPion (WELLBUTRIN SR) 150 MG 12 hr tablet TAKE 1 TABLET BY MOUTH TWICE A DAY 180 tablet 1  . cholecalciferol (VITAMIN D) 1000 UNITS tablet Take 1,000 Units by mouth daily.      [name] Kitchen donepezil (ARICEPT) 10 MG tablet Take 1 tablet (10 mg total) by mouth at bedtime. 90 tablet 0  . doxazosin (CARDURA) 8 MG tablet Take 0.5 tablets (4 mg total) by mouth at bedtime. 45 tablet 1  . fluticasone (FLONASE) 50 MCG/ACT nasal spray INHALE 2 SPRAYS INTO EACH NOSTRIL EVERY DAY AS NEEDED FOR CONGESTION 48 g 3  . gabapentin (NEURONTIN) 300 MG capsule Take 1 capsule (300 mg total) by mouth 2 (two) times daily. 180 capsule 0  . ibuprofen (ADVIL,MOTRIN) 200 MG tablet Take 200-400 mg by mouth every 6 (six) hours as needed for headache (pain).    . insulin NPH Human (HUMULIN N,NOVOLIN N) 100 UNIT/ML injection Inject 0.25 mLs (25 Units total) into the skin 2 (two) times daily before a meal. 10 mL   . insulin regular (NOVOLIN R,HUMULIN R) 100 units/mL injection Inject 0.2 mLs (20 Units total) into the skin 2 (two) times daily before a meal. 10 mL   . lisinopril (PRINIVIL,ZESTRIL) 10 MG tablet Take 1 tablet (10 mg total) by mouth daily. 30 tablet 3  . memantine (NAMENDA) 10 MG tablet Take 1 tablet (10 mg total) by mouth 2 (two) times  daily. 180 tablet 0  . nystatin cream (MYCOSTATIN) Apply 1 application topically 2 (two) times daily. 30 g 0  . omeprazole (PRILOSEC) 40 MG capsule TAKE 1 CAPSULE (40 MG TOTAL) BY MOUTH 2 (TWO) TIMES DAILY BEFORE A MEAL. 60 capsule 1  . ranitidine (ZANTAC  75) 75 MG tablet Take 225 mg by mouth at bedtime.     . sildenafil (REVATIO) 20 MG tablet Generic Revatio / Sildanefil 20 mg. 2 - 5 tabs 30 mins prior to intercourse. 10 tablet prn  . simvastatin (ZOCOR) 40 MG tablet Take 1 tablet (40 mg total) by mouth at bedtime. 90 tablet 0  . testosterone cypionate (DEPOTESTOSTERONE CYPIONATE) 200 MG/ML injection Inject 150 mg every 14 days 10 mL 1  . triamcinolone cream (KENALOG) 0.1 %     . venlafaxine XR (EFFEXOR-XR) 75 MG 24 hr capsule TAKE ONE CAPSULE (75 MG) BY MOUTH EVERY MORNING AND TWO CAPSULES (150 MG) AT NIGHT 270 capsule 0   Current Facility-Administered Medications on File Prior to Visit  Medication Dose Route Frequency Provider Last Rate Last Dose  . testosterone cypionate (DEPOTESTOSTERONE CYPIONATE) injection 150 mg  150 mg Intramuscular Q14 Days Bedsole, Amy E, MD   150 mg at 10/20/17 1011  . testosterone cypionate (DEPOTESTOSTERONE CYPIONATE) injection 150 mg  150 mg Intramuscular Once Bedsole, Amy E, MD      . testosterone cypionate (DEPOTESTOSTERONE CYPIONATE) injection 150 mg  150 mg Intramuscular Q14 Days Bedsole, Amy E, MD   150 mg at 11/17/17 1013     Review of Systems  Constitutional: Positive for fatigue. Negative for activity change, appetite change, fever and unexpected weight change.  HENT: Negative for congestion, rhinorrhea, sore throat and trouble swallowing.   Eyes: Negative for pain, redness, itching and visual disturbance.  Respiratory: Negative for cough, chest tightness, shortness of breath and wheezing.   Cardiovascular: Negative for chest pain and palpitations.  Gastrointestinal: Negative for abdominal pain, blood in stool, constipation, diarrhea and nausea.    Endocrine: Negative for cold intolerance, heat intolerance, polydipsia and polyuria.  Genitourinary: Negative for difficulty urinating, dysuria, frequency and urgency.  Musculoskeletal: Negative for arthralgias, joint swelling and myalgias.  Skin: Negative for pallor and rash.  Neurological: Negative for dizziness, tremors, seizures, syncope, facial asymmetry, speech difficulty, weakness, light-headedness, numbness and headaches.       Feels unsteady but no dizzy  Hematological: Negative for adenopathy. Does not bruise/bleed easily.  Psychiatric/Behavioral: Negative for decreased concentration and dysphoric mood. The patient is not nervous/anxious.        Denies anxiety       Objective:   Physical Exam  Constitutional: He appears well-developed and well-nourished. No distress.  obese and well appearing    HENT:  Head: Normocephalic and atraumatic.  Mouth/Throat: Oropharynx is clear and moist.  Eyes: Pupils are equal, round, and reactive to light. Conjunctivae and EOM are normal.  Neck: Normal range of motion. Neck supple. No JVD present. Carotid bruit is not present. No thyromegaly present.  Cardiovascular: Normal rate, regular rhythm, normal heart sounds and intact distal pulses. Exam reveals no gallop.  Varicose veins in LEs  Pulmonary/Chest: Effort normal and breath sounds normal. No stridor. No respiratory distress. He has no wheezes. He has no rales.  No crackles  Abdominal: Soft. Bowel sounds are normal. He exhibits no distension, no abdominal bruit and no mass. There is no tenderness.  Musculoskeletal: He exhibits no edema or deformity.  Lymphadenopathy:    He has no cervical adenopathy.  Neurological: He is alert. He has normal reflexes. He displays normal reflexes. No cranial nerve deficit. He exhibits normal muscle tone. Coordination normal.  No tremor   Skin: Skin is warm and dry. No rash noted.  Psychiatric: He has a normal mood and affect.  Pleasant  Assessment & Plan:   Problem List Items Addressed This Visit      Cardiovascular and Mediastinum   Essential hypertension - Primary (Chronic)    Labile bp with recent attempt to try hemp oil for bp  Now still elevated on lisinopril and cardura alone  BP: (!) 156/62    Will add back atenolol 100 mg daily -tolerated well in the past  To continue cardura and lisinopril (not hct due to renal insuff) Pulse rate is also high- this may help as well He will watch at home  Disc habits for HTN/ DASH eating and exercise  F/u planned with pcp       Relevant Medications   atenolol (TENORMIN) 100 MG tablet     Other   Obesity (BMI 30-39.9)    Enc him to continue walking and watching diet       Tachycardia    Regular rhythm -off atenolol and not seemingly anxious Lab Results  Component Value Date   TSH 1.480 06/29/2016   will start back atenolol and f/u with PCP

## 2017-12-01 ENCOUNTER — Ambulatory Visit (INDEPENDENT_AMBULATORY_CARE_PROVIDER_SITE_OTHER): Payer: Medicare Other | Admitting: *Deleted

## 2017-12-01 DIAGNOSIS — E291 Testicular hypofunction: Secondary | ICD-10-CM

## 2017-12-01 MED ORDER — TESTOSTERONE CYPIONATE 100 MG/ML IM SOLN
150.0000 mg | Freq: Once | INTRAMUSCULAR | Status: AC
  Administered 2017-12-01: 150 mg via INTRAMUSCULAR

## 2017-12-01 NOTE — Progress Notes (Addendum)
Per orders of Dr Diona Browner (for Dr Lorelei Pont), injection of testosterone Cypionate 150 mg  given by Jacqualin Combes Patient tolerated injection well.

## 2017-12-15 ENCOUNTER — Ambulatory Visit (INDEPENDENT_AMBULATORY_CARE_PROVIDER_SITE_OTHER): Payer: Medicare Other | Admitting: *Deleted

## 2017-12-15 DIAGNOSIS — E291 Testicular hypofunction: Secondary | ICD-10-CM

## 2017-12-15 MED ORDER — TESTOSTERONE CYPIONATE 200 MG/ML IM SOLN: 150.0000 mg | INTRAMUSCULAR | Status: DC

## 2017-12-15 NOTE — Progress Notes (Signed)
Per orders of Dr. Diona Browner, injection of testosterone given by Modena Nunnery. Patient tolerated injection well.

## 2017-12-26 ENCOUNTER — Ambulatory Visit (INDEPENDENT_AMBULATORY_CARE_PROVIDER_SITE_OTHER): Payer: Medicare Other

## 2017-12-26 VITALS — BP 110/60 | HR 77 | Temp 98.4°F | Ht 71.0 in | Wt 224.8 lb

## 2017-12-26 DIAGNOSIS — Z0000 Encounter for general adult medical examination without abnormal findings: Secondary | ICD-10-CM | POA: Diagnosis not present

## 2017-12-26 DIAGNOSIS — E119 Type 2 diabetes mellitus without complications: Secondary | ICD-10-CM

## 2017-12-26 DIAGNOSIS — E291 Testicular hypofunction: Secondary | ICD-10-CM | POA: Diagnosis not present

## 2017-12-26 DIAGNOSIS — Z125 Encounter for screening for malignant neoplasm of prostate: Secondary | ICD-10-CM

## 2017-12-26 DIAGNOSIS — Z79899 Other long term (current) drug therapy: Secondary | ICD-10-CM | POA: Diagnosis not present

## 2017-12-26 DIAGNOSIS — E785 Hyperlipidemia, unspecified: Secondary | ICD-10-CM

## 2017-12-26 DIAGNOSIS — E559 Vitamin D deficiency, unspecified: Secondary | ICD-10-CM

## 2017-12-26 NOTE — Progress Notes (Signed)
PCP notes:   Health maintenance:  Flu vaccine - will get at CPE Foot exam - PCP follow-up needed Eye exam - addressed  Abnormal screenings:   Hearing - failed  Hearing Screening   125Hz  250Hz  500Hz  1000Hz  2000Hz  3000Hz  4000Hz  6000Hz  8000Hz   Right ear:   40 0 40  0    Left ear:   40 40 40  0      Visual Acuity Screening   Right eye Left eye Both eyes  Without correction:     With correction: 20/40-1 20/25-1 20/25    Fall risk - multiple falls Fall Risk  12/26/2017 12/09/2016 10/29/2016 08/26/2015 12/19/2013  Falls in the past year? Yes Yes Yes Yes Yes  Comment multiple falls (7) due to balance issues; denies injury - [facility] Telephone Survey: data to providers prior to load pt had a mini-stroke prior to fall -  Number falls in past yr: 2 or more 2 or more 2 or more 1 1  Comment - - Emmi Telephone Survey Actual Response = 7 - -  Injury with Fall? No Yes Yes No No  Risk Factor Category  High Fall Risk - - - -  Risk for fall due to : Impaired balance/gait;Impaired mobility;History of fall(s) Medication side effect - - -  Follow up - - - Falls evaluation completed -    Depression score: 12 Depression screen [facility] 2/9 12/26/2017 08/03/2017 12/09/2016 08/26/2015 12/19/2013  Decreased Interest 3 3 1 1  0  Down, Depressed, Hopeless 2 2 1 1  0  PHQ - 2 Score 5 5 2 2  0  Altered sleeping 3 3 1  - -  Tired, decreased energy 3 3 1  - -  Change in appetite 1 1 0 - -  Feeling bad or failure about yourself  0 0 0 - -  Trouble concentrating 0 1 0 - -  Moving slowly or fidgety/restless 0 0 2 - -  Suicidal thoughts 0 0 0 - -  PHQ-9 Score 12 13 6  - -  Difficult doing work/chores Somewhat difficult Not difficult at all Somewhat difficult - -  Some recent data might be hidden   Patient concerns:   Patient reports feeling dizzy. Gait is very unsteady. Per pt,he has been taking 300 mg of Atenolol. Advised patient that order is for 100 mg of Atenolol. Patient plans to remove additional pills from pill pack.  Patient was encouraged to go home and discontinue driving until dizziness decreased.   Nurse concerns:  Due to timing of [facility] appt, patient will complete labs on 12/22/17 in AM prior to 10AM.   Next PCP appt:   01/02/18 @ 1040

## 2017-12-26 NOTE — Patient Instructions (Signed)
[name] , Thank you for taking time to come for your Medicare Wellness Visit. I appreciate your ongoing commitment to your health goals. Please review the following plan we discussed and let me know if I can assist you in the future.   These are the goals we discussed: Goals    . Eat more fruits and vegetables     Starting 12/26/2017, I will continue to eat at least 3 servings of fresh fruits and vegetables.        This is a list of the screening recommended for you and due dates:  Health Maintenance  Topic Date Due  . Flu Shot  01/02/2018*  . Complete foot exam   01/02/2018*  . Eye exam for diabetics  07/11/2018*  . Tetanus Vaccine  04/12/2019*  . Hemoglobin A1C  04/16/2018  . Colon Cancer Screening  09/15/2020  .  Hepatitis C: One time screening is recommended by Center for Disease Control  (CDC) for  adults born from [birth year] through 1965.   Completed  . Pneumonia vaccines  Completed  *Topic was postponed. The date shown is not the original due date.   Preventive Care for Adults  A healthy lifestyle and preventive care can promote health and wellness. Preventive health guidelines for adults include the following key practices.  . A routine yearly physical is a good way to check with your health care provider about your health and preventive screening. It is a chance to share any concerns and updates on your health and to receive a thorough exam.  . Visit your dentist for a routine exam and preventive care every 6 months. Brush your teeth twice a day and floss once a day. Good oral hygiene prevents tooth decay and gum disease.  . The frequency of eye exams is based on your age, health, family medical history, use  of contact lenses, and other factors. Follow your health care provider's recommendations for frequency of eye exams.  . Eat a healthy diet. Foods like vegetables, fruits, whole grains, low-fat dairy products, and lean protein foods contain the nutrients you need without  too many calories. Decrease your intake of foods high in solid fats, added sugars, and salt. Eat the right amount of calories for you. Get information about a proper diet from your health care provider, if necessary.  . Regular physical exercise is one of the most important things you can do for your health. Most adults should get at least 150 minutes of moderate-intensity exercise (any activity that increases your heart rate and causes you to sweat) each week. In addition, most adults need muscle-strengthening exercises on 2 or more days a week.  Silver Sneakers may be a benefit available to you. To determine eligibility, you may visit the website: www.silversneakers.com or contact program at (419)142-2706 Mon-Fri between 8AM-8PM.   . Maintain a healthy weight. The body mass index (BMI) is a screening tool to identify possible weight problems. It provides an estimate of body fat based on height and weight. Your health care provider can find your BMI and can help you achieve or maintain a healthy weight.   For adults 20 years and older: ? A BMI below 18.5 is considered underweight. ? A BMI of 18.5 to 24.9 is normal. ? A BMI of 25 to 29.9 is considered overweight. ? A BMI of 30 and above is considered obese.   . Maintain normal blood lipids and cholesterol levels by exercising and minimizing your intake of saturated fat. Eat a  balanced diet with plenty of fruit and vegetables. Blood tests for lipids and cholesterol should begin at age 26 and be repeated every 5 years. If your lipid or cholesterol levels are high, you are over 50, or you are at high risk for heart disease, you may need your cholesterol levels checked more frequently. Ongoing high lipid and cholesterol levels should be treated with medicines if diet and exercise are not working.  . If you smoke, find out from your health care provider how to quit. If you do not use tobacco, please do not start.  . If you choose to drink alcohol,  please do not consume more than 2 drinks per day. One drink is considered to be 12 ounces (355 mL) of beer, 5 ounces (148 mL) of wine, or 1.5 ounces (44 mL) of liquor.  . If you are 62-82 years old, ask your health care provider if you should take aspirin to prevent strokes.  . Use sunscreen. Apply sunscreen liberally and repeatedly throughout the day. You should seek shade when your shadow is shorter than you. Protect yourself by wearing long sleeves, pants, a wide-brimmed hat, and sunglasses year round, whenever you are outdoors.  . Once a month, do a whole body skin exam, using a mirror to look at the skin on your back. Tell your health care provider of new moles, moles that have irregular borders, moles that are larger than a pencil eraser, or moles that have changed in shape or color.

## 2017-12-26 NOTE — Progress Notes (Signed)
I reviewed health advisor's note, was available for consultation, and agree with documentation and plan.   Signed,  Khrystian Schauf T. Saylah Ketner, MD  

## 2017-12-26 NOTE — Progress Notes (Signed)
Subjective:   [name] is a 72 y.o. male who presents for Medicare Annual/Subsequent preventive examination.  Review of Systems:  N/A Cardiac Risk Factors include: advanced age (>94men, >46 women);obesity (BMI >30kg/m2);male gender;hypertension;diabetes mellitus;dyslipidemia     Objective:    Vitals: BP 110/60 (BP Location: Right Arm, Patient Position: Sitting, Cuff Size: Normal)   Pulse 77   Temp 98.4 F (36.9 C) (Oral)   Ht 5\' 11"  (1.803 m) Comment: no shoes  Wt 224 lb 12 oz (101.9 kg)   SpO2 97%   BMI 31.35 kg/m   Body mass index is 31.35 kg/m.  Advanced Directives 12/26/2017 09/27/2017 12/09/2016 09/20/2016 09/17/2016 08/26/2015  Does Patient Have a Medical Advance Directive? No No No No No No  Would patient like information on creating a medical advance directive? No - Patient declined No - Patient declined - - - Yes - Scientist, clinical (histocompatibility and immunogenetics) given    Tobacco Social History   Tobacco Use  Smoking Status Former Smoker  . Packs/day: 3.00  . Years: 35.00  . Pack years: 105.00  . Types: Cigarettes  . Last attempt to quit: 04/12/2000  . Years since quitting: 17.7  Smokeless Tobacco Never Used     Counseling given: No   Clinical Intake:  Pre-visit preparation completed: Yes  Pain : No/denies pain Pain Score: 0-No pain     Nutritional Status: BMI > 30  Obese Nutritional Risks: None Diabetes: No  How often do you need to have someone help you when you read instructions, pamphlets, or other written materials from your doctor or pharmacy?: 1 - Never What is the last grade level you completed in school?: 12th grade  Interpreter Needed?: No  Comments: pt is a widower and lives alone Information entered by :: LPinson, LPN  Past Medical History:  Diagnosis Date  . Allergic rhinitis due to pollen   . Chronic kidney disease, stage III (moderate) ([place]) 12/19/2012  . Depression   . Diabetes mellitus ([place])   . Diverticulosis   . Erectile dysfunction associated  with type 2 diabetes mellitus ([place]) 05/28/2015  . Former very heavy cigarette smoker (more than 40 per day) 10/07/2014  . GERD (gastroesophageal reflux disease)   . Gout   . Headache   . Hyperlipidemia   . Hypertension   . Hypogonadism male 06/14/2012  . Iron deficiency   . Low testosterone   . Memory loss   . Osteoarthritis   . Personal history of colonic polyps    1996   Past Surgical History:  Procedure Laterality Date  . CARDIAC CATHETERIZATION  11/2011   [facility]  . CHOLECYSTECTOMY  1980  . ESOPHAGOGASTRODUODENOSCOPY (EGD) WITH PROPOFOL N/A 09/27/2017   Procedure: ESOPHAGOGASTRODUODENOSCOPY (EGD) WITH PROPOFOL;  Surgeon: Lin Landsman, MD;  Location: [facility];  Service: Gastroenterology;  Laterality: N/A;  . EYE SURGERY Right 07/29/2016   [facility] - Dr. Manuella Ghazi  . EYE SURGERY Left 08/23/2106   cataract - Dr. Manuella Ghazi  . TOTAL HIP ARTHROPLASTY  2009   Dr. Gladstone Lighter  . TOTAL HIP ARTHROPLASTY     Family History  Problem Relation Age of Onset  . Diabetes Mother   . Alzheimer's disease Mother   . Diabetes Father   . Lung cancer Father        Age 49  . Stroke Father   . Heart attack Father   . Lung cancer Sister        lung  . Heart disease Maternal Grandfather   . COPD Sister   .  Heart attack Sister   . Heart disease Sister    Social History   Socioeconomic History  . Marital status: Single    Spouse name: Not on file  . Number of children: 4  . Years of education: 12th  . Highest education level: Not on file  Occupational History  . Occupation: Counsellor  . 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 Financial resource strain: Not on file  . Food insecurity:    Worry: Not on file    Inability: Not on file  . Transportation needs:    Medical: Not on file    Non-medical: Not on file  Tobacco Use  . Smoking status: Former Smoker    Packs/day: 3.00    Years: 35.00    Pack years: 105.00    Types: Cigarettes    Last  attempt to quit: 04/12/2000    Years since quitting: 17.7  . Smokeless tobacco: Never Used  Substance and Sexual Activity  . Alcohol use: No  . Drug use: No  . Sexual activity: Never  Lifestyle  . Physical activity:    Days per week: Not on file    Minutes per session: Not on file  . Stress: Not on file  Relationships  . Social connections:    Talks on phone: Not on file    Gets together: Not on file    Attends religious service: Not on file    Active member of club or organization: Not on file    Attends meetings of clubs or organizations: Not on file    Relationship status: Not on file  Other Topics Concern  . Not on file  Social History Narrative   No regular exercise   Caffeine use: yes, dt coke   Lives at home with his wife and his mother lives with him.   Right-handed.    Outpatient Encounter Medications as of 12/26/2017  Medication Sig  . aspirin EC 81 MG tablet Take 81 mg by mouth at bedtime.  [name] Kitchen atenolol (TENORMIN) 100 MG tablet Take 1 tablet (100 mg total) by mouth daily.  [name] Kitchen buPROPion (WELLBUTRIN SR) 150 MG 12 hr tablet TAKE 1 TABLET BY MOUTH TWICE A DAY  . cholecalciferol (VITAMIN D) 1000 UNITS tablet Take 1,000 Units by mouth daily.    [name] Kitchen donepezil (ARICEPT) 10 MG tablet Take 1 tablet (10 mg total) by mouth at bedtime.  [name] Kitchen doxazosin (CARDURA) 8 MG tablet Take 0.5 tablets (4 mg total) by mouth at bedtime.  . fluticasone (FLONASE) 50 MCG/ACT nasal spray INHALE 2 SPRAYS INTO EACH NOSTRIL EVERY DAY AS NEEDED FOR CONGESTION  . gabapentin (NEURONTIN) 300 MG capsule Take 1 capsule (300 mg total) by mouth 2 (two) times daily.  [name] Kitchen ibuprofen (ADVIL,MOTRIN) 200 MG tablet Take 200-400 mg by mouth every 6 (six) hours as needed for headache (pain).  . insulin NPH Human (HUMULIN N,NOVOLIN N) 100 UNIT/ML injection Inject 0.25 mLs (25 Units total) into the skin 2 (two) times daily before a meal.  . insulin regular (NOVOLIN R,HUMULIN R) 100 units/mL injection Inject 0.2 mLs (20 Units  total) into the skin 2 (two) times daily before a meal.  . lisinopril (PRINIVIL,ZESTRIL) 10 MG tablet Take 1 tablet (10 mg total) by mouth daily.  . memantine (NAMENDA) 10 MG tablet Take 1 tablet (10 mg total) by mouth 2 (two) times daily.  [name] Kitchen nystatin cream (MYCOSTATIN) Apply 1 application topically 2 (two) times daily.  [name] Kitchen  omeprazole (PRILOSEC) 40 MG capsule TAKE 1 CAPSULE (40 MG TOTAL) BY MOUTH 2 (TWO) TIMES DAILY BEFORE A MEAL.  . ranitidine (ZANTAC 75) 75 MG tablet Take 225 mg by mouth at bedtime.   . sildenafil (REVATIO) 20 MG tablet Generic Revatio / Sildanefil 20 mg. 2 - 5 tabs 30 mins prior to intercourse.  . simvastatin (ZOCOR) 40 MG tablet Take 1 tablet (40 mg total) by mouth at bedtime.  [name] Kitchen testosterone cypionate (DEPOTESTOSTERONE CYPIONATE) 200 MG/ML injection Inject 150 mg every 14 days  . triamcinolone cream (KENALOG) 0.1 %   . venlafaxine XR (EFFEXOR-XR) 75 MG 24 hr capsule TAKE ONE CAPSULE (75 MG) BY MOUTH EVERY MORNING AND TWO CAPSULES (150 MG) AT NIGHT   Facility-Administered Encounter Medications as of 12/26/2017  Medication  . testosterone cypionate (DEPOTESTOSTERONE CYPIONATE) injection 150 mg  . testosterone cypionate (DEPOTESTOSTERONE CYPIONATE) injection 150 mg  . testosterone cypionate (DEPOTESTOSTERONE CYPIONATE) injection 150 mg  . testosterone cypionate (DEPOTESTOSTERONE CYPIONATE) injection 150 mg    Activities of Daily Living In your present state of health, do you have any difficulty performing the following activities: 12/26/2017  Hearing? N  Vision? N  Difficulty concentrating or making decisions? N  Walking or climbing stairs? Y  Dressing or bathing? N  Doing errands, shopping? N  Preparing Food and eating ? N  Using the Toilet? N  In the past six months, have you accidently leaked urine? N  Do you have problems with loss of bowel control? N  Managing your Medications? N  Managing your Finances? N  Housekeeping or managing your Housekeeping? N  Some  recent data might be hidden    Patient Care Team: [name], MD as PCP - General Carlean Purl Ofilia Neas, MD as Consulting Physician (Gastroenterology) Philemon Kingdom, MD as Consulting Physician (Internal Medicine)   Assessment:   This is a routine wellness examination for Antrone.   Hearing Screening   125Hz  250Hz  500Hz  1000Hz  2000Hz  3000Hz  4000Hz  6000Hz  8000Hz   Right ear:   40 0 40  0    Left ear:   40 40 40  0      Visual Acuity Screening   Right eye Left eye Both eyes  Without correction:     With correction: 20/40-1 20/25-1 20/25    Exercise Activities and Dietary recommendations Current Exercise Habits: The patient does not participate in regular exercise at present, Exercise limited by: None identified  Goals    . Eat more fruits and vegetables     Starting 12/26/2017, I will continue to eat at least 3 servings of fresh fruits and vegetables.        Fall Risk Fall Risk  12/26/2017 12/09/2016 10/29/2016 08/26/2015 12/19/2013  Falls in the past year? Yes Yes Yes Yes Yes  Comment multiple falls (7) due to balance issues; denies injury - Emmi Telephone Survey: data to providers prior to load pt had a mini-stroke prior to fall -  Number falls in past yr: 2 or more 2 or more 2 or more 1 1  Comment - - Emmi Telephone Survey Actual Response = 7 - -  Injury with Fall? No Yes Yes No No  Risk Factor Category  High Fall Risk - - - -  Risk for fall due to : Impaired balance/gait;Impaired mobility;History of fall(s) Medication side effect - - -  Follow up - - - Falls evaluation completed -   Depression Screen PHQ 2/9 Scores 12/26/2017 08/03/2017 12/09/2016 08/26/2015  PHQ - 2 Score 5 5  2 2  PHQ- 9 Score 12 13 6  -    Cognitive Function MMSE - Mini Mental State Exam 12/26/2017 12/09/2016 10/28/2016 06/29/2016 08/26/2015  Orientation to time 5 5 5 5 5   Orientation to Place 5 5 5 5 5   Registration 3 3 3 3 3   Attention/ Calculation 0 0 5 3 0  Recall 3 3 2 2 3   Language- name 2 objects 0 0  2 2 0  Language- repeat 1 1 1 1 1   Language- follow 3 step command 3 3 3 3 3   Language- read & follow direction 0 0 1 1 0  Write a sentence 0 0 1 1 0  Copy design 0 0 1 1 0  Total score 20 20 29 27 20      PLEASE NOTE: A Mini-Cog screen was completed. Maximum score is 20. A value of 0 denotes this part of [place] MMSE was not completed or the patient failed this part of the Mini-Cog screening.   Mini-Cog Screening Orientation to Time - Max 5 pts Orientation to Place - Max 5 pts Registration - Max 3 pts Recall - Max 3 pts Language Repeat - Max 1 pts Language Follow 3 Step Command - Max 3 pts     Immunization History  Administered Date(s) Administered  . Influenza Split 12/29/2010  . Influenza Whole 01/27/2009, 02/05/2010  . Influenza, High Dose Seasonal PF 02/03/2017  . Influenza, Seasonal, Injecte, Preservative Fre 06/14/2012  . Influenza,inj,Quad PF,6+ Mos 12/18/2012, 12/19/2013, 03/19/2015, 01/05/2016  . Pneumococcal Conjugate-13 12/19/2013  . Pneumococcal Polysaccharide-23 10/25/2008, 08/26/2015  . Td 04/13/2007    Screening Tests Health Maintenance  Topic Date Due  . INFLUENZA VACCINE  01/02/2018 (Originally 11/10/2017)  . FOOT EXAM  01/02/2018 (Originally 12/11/2017)  . OPHTHALMOLOGY EXAM  07/11/2018 (Originally 06/23/2017)  . TETANUS/TDAP  04/12/2019 (Originally 04/12/2017)  . HEMOGLOBIN A1C  04/16/2018  . COLONOSCOPY  09/15/2020  . Hepatitis C Screening  Completed  . PNA vac Low Risk Adult  Completed       Plan:     I have personally reviewed, addressed, and noted the following in the patient's chart:  A. Medical and social history B. Use of alcohol, tobacco or illicit drugs  C. Current medications and supplements D. Functional ability and status E.  Nutritional status F.  Physical activity G. Advance directives H. List of other physicians I.  Hospitalizations, surgeries, and ER visits in previous 12 months J.  [place] to include hearing, vision,  cognitive, depression L. Referrals and appointments - none  In addition, I have reviewed and discussed with patient certain preventive protocols, quality metrics, and best practice recommendations. A written personalized care plan for preventive services as well as general preventive health recommendations were provided to patient.  See attached scanned questionnaire for additional information.   Signed,   Lindell Noe, MHA, BS, LPN Health Coach

## 2017-12-27 ENCOUNTER — Telehealth: Payer: Self-pay

## 2017-12-27 DIAGNOSIS — R7989 Other specified abnormal findings of blood chemistry: Secondary | ICD-10-CM

## 2017-12-27 DIAGNOSIS — E291 Testicular hypofunction: Secondary | ICD-10-CM

## 2017-12-27 DIAGNOSIS — Z125 Encounter for screening for malignant neoplasm of prostate: Secondary | ICD-10-CM

## 2017-12-27 DIAGNOSIS — E1165 Type 2 diabetes mellitus with hyperglycemia: Secondary | ICD-10-CM

## 2017-12-27 DIAGNOSIS — E781 Pure hyperglyceridemia: Secondary | ICD-10-CM

## 2017-12-27 DIAGNOSIS — IMO0002 Reserved for concepts with insufficient information to code with codable children: Secondary | ICD-10-CM

## 2017-12-27 DIAGNOSIS — E1142 Type 2 diabetes mellitus with diabetic polyneuropathy: Secondary | ICD-10-CM

## 2017-12-27 DIAGNOSIS — I10 Essential (primary) hypertension: Secondary | ICD-10-CM

## 2017-12-27 NOTE — Telephone Encounter (Signed)
Opened encounter in error. CPE labs have been ordered.

## 2017-12-29 ENCOUNTER — Ambulatory Visit: Payer: Medicare Other

## 2017-12-29 ENCOUNTER — Ambulatory Visit (INDEPENDENT_AMBULATORY_CARE_PROVIDER_SITE_OTHER): Payer: Medicare Other

## 2017-12-29 ENCOUNTER — Other Ambulatory Visit (INDEPENDENT_AMBULATORY_CARE_PROVIDER_SITE_OTHER): Payer: Medicare Other

## 2017-12-29 DIAGNOSIS — E291 Testicular hypofunction: Secondary | ICD-10-CM

## 2017-12-29 DIAGNOSIS — Z125 Encounter for screening for malignant neoplasm of prostate: Secondary | ICD-10-CM

## 2017-12-29 DIAGNOSIS — E785 Hyperlipidemia, unspecified: Secondary | ICD-10-CM | POA: Diagnosis not present

## 2017-12-29 DIAGNOSIS — Z79899 Other long term (current) drug therapy: Secondary | ICD-10-CM

## 2017-12-29 DIAGNOSIS — E119 Type 2 diabetes mellitus without complications: Secondary | ICD-10-CM | POA: Diagnosis not present

## 2017-12-29 DIAGNOSIS — E559 Vitamin D deficiency, unspecified: Secondary | ICD-10-CM

## 2017-12-29 LAB — BASIC METABOLIC PANEL
BUN: 16 mg/dL (ref 6–23)
CALCIUM: 9.3 mg/dL (ref 8.4–10.5)
CO2: 34 mEq/L — ABNORMAL HIGH (ref 19–32)
Chloride: 93 mEq/L — ABNORMAL LOW (ref 96–112)
Creatinine, Ser: 1.66 mg/dL — ABNORMAL HIGH (ref 0.40–1.50)
GFR: 43.48 mL/min — AB (ref >60.00)
GLUCOSE: 468 mg/dL — AB (ref 70–99)
Potassium: 4.4 mEq/L (ref 3.5–5.1)
Sodium: 133 mEq/L — ABNORMAL LOW (ref 135–145)

## 2017-12-29 LAB — CBC WITH DIFFERENTIAL/PLATELET
BASOS PCT: 0.3 % (ref 0.0–3.0)
Basophils Absolute: 0 10*3/uL (ref 0.0–0.1)
EOS ABS: 0.1 10*3/uL (ref 0.0–0.7)
Eosinophils Relative: 1.2 % (ref 0.0–5.0)
HCT: 47.4 % (ref 39.0–52.0)
Hemoglobin: 16 g/dL (ref 13.0–17.0)
LYMPHS ABS: 1.4 10*3/uL (ref 0.7–4.0)
LYMPHS PCT: 20.7 % (ref 12.0–46.0)
MCHC: 33.8 g/dL (ref 30.0–36.0)
MCV: 89.5 fl (ref 78.0–100.0)
Monocytes Absolute: 0.8 10*3/uL (ref 0.1–1.0)
Monocytes Relative: 11.2 % (ref 3.0–12.0)
NEUTROS ABS: 4.6 10*3/uL (ref 1.4–7.7)
NEUTROS PCT: 66.6 % (ref 43.0–77.0)
PLATELETS: 169 10*3/uL (ref 150.0–400.0)
RBC: 5.29 Mil/uL (ref 4.22–5.81)
RDW: 13.6 % (ref 11.5–15.5)
WBC: 6.9 10*3/uL (ref 4.0–10.5)

## 2017-12-29 LAB — LIPID PANEL
Cholesterol: 125 mg/dL (ref 0–200)
HDL: 33.1 mg/dL — ABNORMAL LOW (ref >39.00)
NonHDL: 91.97
Total CHOL/HDL Ratio: 4
Triglycerides: 345 mg/dL — ABNORMAL HIGH (ref 0.0–149.0)
VLDL: 69 mg/dL — AB (ref 0.0–40.0)

## 2017-12-29 LAB — HEMOGLOBIN A1C: HEMOGLOBIN A1C: 11.9 % — AB (ref 4.6–6.5)

## 2017-12-29 LAB — HEPATIC FUNCTION PANEL
ALBUMIN: 4.2 g/dL (ref 3.5–5.2)
ALT: 15 U/L (ref 0–53)
AST: 12 U/L (ref 0–37)
Alkaline Phosphatase: 88 U/L (ref 39–117)
BILIRUBIN TOTAL: 1.1 mg/dL (ref 0.2–1.2)
Bilirubin, Direct: 0.2 mg/dL (ref 0.0–0.3)
Total Protein: 6.9 g/dL (ref 6.0–8.3)

## 2017-12-29 LAB — MICROALBUMIN / CREATININE URINE RATIO
CREATININE, U: 56.1 mg/dL
MICROALB UR: 34 mg/dL — AB (ref 0.0–1.9)
Microalb Creat Ratio: 60.6 mg/g — ABNORMAL HIGH (ref 0.0–30.0)

## 2017-12-29 LAB — LDL CHOLESTEROL, DIRECT: LDL DIRECT: 103 mg/dL

## 2017-12-29 LAB — VITAMIN D 25 HYDROXY (VIT D DEFICIENCY, FRACTURES): VITD: 15.29 ng/mL — AB (ref 30.00–100.00)

## 2017-12-29 LAB — PSA, MEDICARE: PSA: 0.75 ng/ml (ref 0.10–4.00)

## 2017-12-29 MED ORDER — TESTOSTERONE CYPIONATE 200 MG/ML IM SOLN
150.0000 mg | Freq: Once | INTRAMUSCULAR | Status: AC
  Administered 2017-12-29: 150 mg via INTRAMUSCULAR

## 2017-12-29 NOTE — Progress Notes (Signed)
Per orders of Dr. Diona Browner, injection of Testosterone cypionate 150 mg given by Ozzie Hoyle. Patient tolerated injection well.

## 2018-01-02 ENCOUNTER — Ambulatory Visit (INDEPENDENT_AMBULATORY_CARE_PROVIDER_SITE_OTHER)
Admission: RE | Admit: 2018-01-02 | Discharge: 2018-01-02 | Disposition: A | Discharge status: Home / Self Care | Payer: Medicare Other | Source: Ambulatory Visit | Attending: Family Medicine | Admitting: Family Medicine

## 2018-01-02 ENCOUNTER — Ambulatory Visit (INDEPENDENT_AMBULATORY_CARE_PROVIDER_SITE_OTHER): Payer: Medicare Other | Admitting: Family Medicine

## 2018-01-02 ENCOUNTER — Encounter: Payer: Self-pay | Admitting: Family Medicine

## 2018-01-02 VITALS — BP 120/60 | HR 91 | Temp 97.3°F | Ht 71.0 in | Wt 223.8 lb

## 2018-01-02 DIAGNOSIS — M25511 Pain in right shoulder: Secondary | ICD-10-CM

## 2018-01-02 DIAGNOSIS — W19XXXA Unspecified fall, initial encounter: Secondary | ICD-10-CM | POA: Diagnosis not present

## 2018-01-02 DIAGNOSIS — I10 Essential (primary) hypertension: Secondary | ICD-10-CM | POA: Diagnosis not present

## 2018-01-02 DIAGNOSIS — S060X0A Concussion without loss of consciousness, initial encounter: Secondary | ICD-10-CM | POA: Diagnosis not present

## 2018-01-02 DIAGNOSIS — R55 Syncope and collapse: Secondary | ICD-10-CM

## 2018-01-02 DIAGNOSIS — N183 Chronic kidney disease, stage 3 unspecified: Secondary | ICD-10-CM

## 2018-01-02 DIAGNOSIS — S4991XA Unspecified injury of right shoulder and upper arm, initial encounter: Secondary | ICD-10-CM | POA: Diagnosis not present

## 2018-01-02 DIAGNOSIS — E1165 Type 2 diabetes mellitus with hyperglycemia: Secondary | ICD-10-CM | POA: Diagnosis not present

## 2018-01-02 DIAGNOSIS — IMO0002 Reserved for concepts with insufficient information to code with codable children: Secondary | ICD-10-CM

## 2018-01-02 DIAGNOSIS — E1142 Type 2 diabetes mellitus with diabetic polyneuropathy: Secondary | ICD-10-CM | POA: Diagnosis not present

## 2018-01-02 NOTE — Progress Notes (Signed)
Dr. Frederico Hamman T. Milta Croson, MD, [facility] [place], [address] Phone: [phone] Fax: [phone]  01/02/2018  Patient: [name], [name]: [id number], DOB: 20-Jan-1946, 72 y.o.  Primary Physician:  [name], MD   Chief Complaint  Patient presents with  . Annual Exam  . Fall    Yesterday  . Loss of Consciousness   Subjective:   [name] is a 72 y.o. very pleasant male patient who presents with the following:  The patient was initially set up for a physical exam, but due to emergent acute issues and changes, we will postpone this and take care of his urgent needs right now.  DOI: 01/01/2018  The patient was initially scheduled to be seen here for follow-up on general medical problems and follow-up Medicare physical, but he fell yesterday and hit his head and lost consciousness.  He did not take any insulin on either Friday or Saturday, and his blood sugar was over 500 at the time. a1c is 12.  The patient fell yesterday, hit his head and had a loss of consciousness. BS was > 500 at the time. Did miss a couple of days of insulin - did not take Friday or Saturday.  Felt woozy when getting out of the car. Prob went out and then went down but he is not completely sure.  He does have a little bit of head pressure, and is also got a black eye and bruising on the right side of his head.  He did have some visual change yesterday, but that is resolved.  He thinks he is a little bit more irritable compared to baseline, but is not having any other sadness or emotional change.  He does not feel mentally foggy or different, but his grandson does think he is a little bit different compared to baseline.  No change in sleep pattern.  His balance is a little bit worse compared to baseline, and is not good at baseline.  He has not had any prior concussive history. Does have a history of depression.   He did strike the right  side of his face, he has a small cut, and he has bruising about the right eye.  R shoulder hurting really bad.  Since yesterday he has had quite a bit of pain in his right shoulder, and he is had a great deal of difficulty moving this.  He is now protecting this and holding it near his body.  Globally, he is not doing well since his wife died, and he is not been good in terms of following an appropriate diet, taking his medication, taking his insulin, and he is rebuffed my suggestions to go to additional endocrinology help.  Past Medical History, Surgical History, Social History, Family History, Problem List, Medications, and Allergies have been reviewed and updated if relevant.  Patient Active Problem List   Diagnosis Date Noted  . Uncontrolled type 2 diabetes mellitus with peripheral neuropathy ([place]) 05/11/2016    Priority: Medium  . Former very heavy cigarette smoker (more than 40 per day) 10/07/2014    Priority: Medium  . Chronic kidney disease, stage III (moderate) ([place]) 12/19/2012    Priority: Medium  . OSA (obstructive sleep apnea) 03/09/2011    Priority: Medium  . Diabetic neuropathy ([place]) 12/29/2010    Priority: Medium  . Hypertriglyceridemia 06/13/2008    Priority: Medium  . Major depressive disorder, recurrent episode, in partial remission ([place]) 06/13/2008  Priority: Medium  . Essential hypertension 06/13/2008    Priority: Medium  . Falls 10/14/2017  . Esophageal dysphagia   . Abdominal bloating   . Muscle spasm 11/05/2016  . Mild cognitive disorder 06/29/2016  . Erectile dysfunction associated with type 2 diabetes mellitus ([place]) 05/28/2015  . Tachycardia 09/10/2014  . Hypogonadism male 06/14/2012  . Obesity (BMI 30-39.9) 09/27/2011  . Family history of MI (myocardial infarction) 09/16/2010  . HIP REPLACEMENT, RIGHT, HX OF 02/05/2010  . GOUT 06/13/2008  . ALLERGIC RHINITIS 06/13/2008  . GERD 06/13/2008  . OSTEOARTHRITIS 06/13/2008    Past Medical History:    Diagnosis Date  . Allergic rhinitis due to pollen   . Chronic kidney disease, stage III (moderate) ([place]) 12/19/2012  . Depression   . Diabetes mellitus ([place])   . Diverticulosis   . Erectile dysfunction associated with type 2 diabetes mellitus ([place]) 05/28/2015  . Former very heavy cigarette smoker (more than 40 per day) 10/07/2014  . GERD (gastroesophageal reflux disease)   . Gout   . Headache   . Hyperlipidemia   . Hypertension   . Hypogonadism male 06/14/2012  . Iron deficiency   . Low testosterone   . Memory loss   . Osteoarthritis   . Personal history of colonic polyps    1996    Past Surgical History:  Procedure Laterality Date  . CARDIAC CATHETERIZATION  11/2011   [facility]  . CHOLECYSTECTOMY  1980  . ESOPHAGOGASTRODUODENOSCOPY (EGD) WITH PROPOFOL N/A 09/27/2017   Procedure: ESOPHAGOGASTRODUODENOSCOPY (EGD) WITH PROPOFOL;  Surgeon: Lin Landsman, MD;  Location: [facility];  Service: Gastroenterology;  Laterality: N/A;  . EYE SURGERY Right 07/29/2016   [facility] - Dr. Manuella Ghazi  . EYE SURGERY Left 08/23/2106   [facility] - Dr. Manuella Ghazi  . TOTAL HIP ARTHROPLASTY  2009   Dr. Gladstone Lighter  . TOTAL HIP ARTHROPLASTY      Social History   Socioeconomic History  . Marital status: Single    Spouse name: Not on file  . Number of children: 4  . Years of education: 12th  . Highest education level: Not on file  Occupational History  . Occupation: Counsellor  . 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 Financial resource strain: Not on file  . Food insecurity:    Worry: Not on file    Inability: Not on file  . Transportation needs:    Medical: Not on file    Non-medical: Not on file  Tobacco Use  . Smoking status: Former Smoker    Packs/day: 3.00    Years: 35.00    Pack years: 105.00    Types: Cigarettes    Last attempt to quit: 04/12/2000    Years since quitting: 17.7  . Smokeless tobacco: Never Used  Substance and Sexual Activity  .  Alcohol use: No  . Drug use: No  . Sexual activity: Never  Lifestyle  . Physical activity:    Days per week: Not on file    Minutes per session: Not on file  . Stress: Not on file  Relationships  . Social connections:    Talks on phone: Not on file    Gets together: Not on file    Attends religious service: Not on file    Active member of club or organization: Not on file    Attends meetings of clubs or organizations: Not on file    Relationship status: Not on file  . Intimate partner  violence:    Fear of current or ex partner: Not on file    Emotionally abused: Not on file    Physically abused: Not on file    Forced sexual activity: Not on file  Other Topics Concern  . Not on file  Social History Narrative   No regular exercise   Caffeine use: yes, dt coke   Lives at home with his wife and his mother lives with him.   Right-handed.    Family History  Problem Relation Age of Onset  . Diabetes Mother   . Alzheimer's disease Mother   . Diabetes Father   . Lung cancer Father        Age 26  . Stroke Father   . Heart attack Father   . Lung cancer Sister        lung  . Heart disease Maternal Grandfather   . COPD Sister   . Heart attack Sister   . Heart disease Sister     Allergies  Allergen Reactions  . Tetracycline Itching and Rash    Medication list reviewed and updated in full in [facility].   GEN: No acute illnesses, no fevers, chills. GI: No n/v/d, eating normally Pulm: No SOB Interactive and getting along well at home.  Otherwise, ROS is as per the HPI.  Objective:   BP 120/60   Pulse 91   Temp (!) 97.3 F (36.3 C) (Oral)   Ht 5\' 11"  (1.803 m)   Wt 223 lb 12 oz (101.5 kg)   BMI 31.21 kg/m   GEN: WDWN, NAD, Non-toxic, A & O x 3 HEENT: Atraumatic, Normocephalic. Neck supple. No masses, No LAD. Ears and Nose: No external deformity. CV: RRR, No M/G/R. No JVD. No thrill. No extra heart sounds. PULM: CTA B, no wheezes, crackles, rhonchi. No  retractions. No resp. distress. No accessory muscle use. EXTR: No c/c/e NEURO unsteady gait PSYCH: Normally interactive. Conversant. Not depressed or anxious appearing.  Calm demeanor.   Right shoulder: The patient has no tenderness along his clavicle and no significant tenderness at the [facility] joint.  No tenderness at the elbow with full range of motion at the elbow in flexion, extension, supination and pronation.  Along the proximal humerus the patient does have significant tenderness proximally.  He is able to abduct the shoulder approximately 35 degrees as well as in flexion.  Difficult to assess strength secondary to current pain level.  Neuro: CN 2-12 grossly intact. PERRLA. EOMI. Sensation intact throughout. Str 5/5 all extremities. DTR 2+. No clonus. A and o x 4. Romberg unsteady. Finger nose neg. Unsteadiness with walking     Laboratory and Imaging Data: Dg Scapula Right  Result Date: 01/02/2018 CLINICAL DATA:  Acute right shoulder pain after fall yesterday. EXAM: RIGHT SCAPULA - 2+ VIEWS COMPARISON:  None. FINDINGS: There is no evidence of fracture or other focal bone lesions. Soft tissues are unremarkable. IMPRESSION: Normal right scapula. Electronically Signed   By: Marijo Conception, M.D.   On: 01/02/2018 15:14   Dg Shoulder Right  Result Date: 01/02/2018 CLINICAL DATA:  Acute right shoulder pain after fall yesterday. EXAM: RIGHT SHOULDER - 2+ VIEW COMPARISON:  None. FINDINGS: There is no evidence of fracture or dislocation. Mild degenerative joint disease of right acromioclavicular joint is noted. Mild acromial spurring is noted. Soft tissues are unremarkable. IMPRESSION: Mild acromial spurring. Mild degenerative joint disease of right acromioclavicular joint. No other significant abnormality seen in the right shoulder. Electronically Signed  By: Marijo Conception, M.D.   On: 01/02/2018 15:13     Assessment and Plan:   Syncope and collapse - Plan: EKG 12-Lead  Uncontrolled type 2  diabetes mellitus with peripheral neuropathy ([place]) - Plan: Ambulatory referral to Endocrinology  Essential hypertension  Chronic kidney disease, stage III (moderate) ([place])  Fall, initial encounter  Acute pain of right shoulder - Plan: DG Shoulder Right, DG Scapula Right  Concussion without loss of consciousness, initial encounter  EKG: Normal sinus rhythm. Normal axis, normal R wave progression, No acute ST elevation or depression.   >40 minutes spent in face to face time with patient, >50% spent in counselling or coordination of care   Syncope and collapse, no blood sugar greater than 500 at the time, and the patient had not taken any insulin for at least 2 days.  EKG now appears normal.  Blood pressure appears normal.  Most likely cause appears to be secondary to very out-of-control diabetes.  His A1c is 12.  I am going to have him go back to see endocrinology, and hopefully they can help to some degree.  Patient struck his head, and he has abnormal symptoms and an abnormal neurological exam which would correspond to acute concussion.  We will treat this as such, and place him on complete neurocognitive rest.  I reviewed all this in great detail with the patient and his grandson.  He is not to drive an automobile.  Confounders include some underlying dementia as well as depression.  Right shoulder injury is significant.  There does not appear to be any fracture.  Risk for full-thickness rotator cuff tear versus high-grade partial-thickness rotator cuff tear is high.  Right now I am good to have him do some basic pendulums only and will reassess at his follow-up.  I strongly emphasized to the patient that above all else, he needed to take his insulin and diabetic medications without missing a dose, and this was the utmost importance in regards to his health.  Follow-up: Return in about 2 weeks (around 01/16/2018).  Orders Placed This Encounter  Procedures  . DG Shoulder Right  . DG  Scapula Right  . Ambulatory referral to Endocrinology  . EKG 12-Lead    Signed,  Krrish Freund T. Toan Mort, MD   Allergies as of 01/02/2018      Reactions   Tetracycline Itching, Rash      Medication List        Accurate as of 01/02/18  2:09 PM. Always use your most recent med list.          aspirin EC 81 MG tablet Take 81 mg by mouth at bedtime.   atenolol 100 MG tablet Commonly known as:  TENORMIN Take 1 tablet (100 mg total) by mouth daily.   buPROPion 150 MG 12 hr tablet Commonly known as:  WELLBUTRIN SR TAKE 1 TABLET BY MOUTH TWICE A DAY   cholecalciferol 1000 units tablet Commonly known as:  VITAMIN D Take 1,000 Units by mouth daily.   donepezil 10 MG tablet Commonly known as:  ARICEPT Take 1 tablet (10 mg total) by mouth at bedtime.   doxazosin 8 MG tablet Commonly known as:  CARDURA Take 0.5 tablets (4 mg total) by mouth at bedtime.   fluticasone 50 MCG/ACT nasal spray Commonly known as:  FLONASE INHALE 2 SPRAYS INTO EACH NOSTRIL EVERY DAY AS NEEDED FOR CONGESTION   gabapentin 300 MG capsule Commonly known as:  NEURONTIN Take 1 capsule (300 mg total) by  mouth 2 (two) times daily.   ibuprofen 200 MG tablet Commonly known as:  ADVIL,MOTRIN Take 200-400 mg by mouth every 6 (six) hours as needed for headache (pain).   insulin NPH Human 100 UNIT/ML injection Commonly known as:  HUMULIN N,NOVOLIN N Inject 0.25 mLs (25 Units total) into the skin 2 (two) times daily before a meal.   insulin regular 100 units/mL injection Commonly known as:  NOVOLIN R,HUMULIN R Inject 0.2 mLs (20 Units total) into the skin 2 (two) times daily before a meal.   lisinopril 10 MG tablet Commonly known as:  PRINIVIL,ZESTRIL Take 1 tablet (10 mg total) by mouth daily.   memantine 10 MG tablet Commonly known as:  NAMENDA Take 1 tablet (10 mg total) by mouth 2 (two) times daily.   nystatin cream Commonly known as:  MYCOSTATIN Apply 1 application topically 2 (two) times  daily.   omeprazole 40 MG capsule Commonly known as:  PRILOSEC TAKE 1 CAPSULE (40 MG TOTAL) BY MOUTH 2 (TWO) TIMES DAILY BEFORE A MEAL.   sildenafil 20 MG tablet Commonly known as:  REVATIO Generic Revatio / Sildanefil 20 mg. 2 - 5 tabs 30 mins prior to intercourse.   simvastatin 40 MG tablet Commonly known as:  ZOCOR Take 1 tablet (40 mg total) by mouth at bedtime.   testosterone cypionate 200 MG/ML injection Commonly known as:  DEPOTESTOSTERONE CYPIONATE Inject 150 mg every 14 days   triamcinolone cream 0.1 % Commonly known as:  KENALOG   venlafaxine XR 75 MG 24 hr capsule Commonly known as:  EFFEXOR-XR TAKE ONE CAPSULE (75 MG) BY MOUTH EVERY MORNING AND TWO CAPSULES (150 MG) AT NIGHT   ZANTAC 75 75 MG tablet Generic drug:  ranitidine Take 225 mg by mouth at bedtime.

## 2018-01-02 NOTE — Patient Instructions (Signed)
HEAD INJURY / CONCUSSSION:   MOST PEOPLE RECOVER FINE AND COMPLETELY FROM A CONCUSSION, BUT THE MOST IMPORTANT THING IS VERY EARLY COMPLETE REST SO THAT THE BRAN CAN RECOVER.  COMPLETE PHYSICAL AND MENTAL REST IS NEEDED.  THAT MEANS: NO SCHOOL OR WORK UNTIL YOU ARE BETTER NO PHYSICAL EXERTION AT ALL UNTIL YOU HAVE NO SYMPTOMS NO MENTAL EXERTION, MEANING NO WORK, NO HOMEWORK, NO TEST TAKING.  NO DRIVING UNTIL YOU ARE ASYMPTOMATIC.  NO VIDEO GAMES, NO USING THE COMPUTER, NO TEXTING, NO USING SMARTPHONES, NO USE OF AN IPAD OR TABLET. DO NOT GO TO A MOVIE THEATRE OR WATCH SPORTS ON TV. HDTV TENDS TO MAKE PEOPLE FEEL WORSE.   IN OTHER WORDS, DO NOT DO ANYTHING. SIT AND CALMLY REST UNTIL YOU FEEL BETTER. SLEEP IS OK. YOU CAN HANG OUT AND TALK TO A FRIEND.  IT IS DIFFICULT TO KNOW HOW QUICKLY YOU WILL RECOVER. SOME PEOPLE FEEL BETTER IN A FEW DAYS, WHILE OTHER PEOPLE HAVE SYMPTOMS THAT CAN LAST FOR WEEKS TO MONTHS.  EARLY REST IS BY FAR THE MOST IMPORTANT THING.  If any of the following occur notify your physician or go to the Hospital Emergency Department - if markedly worsening:  . Increased drowsiness, stupor or loss of consciousness . Restlessness or convulsions (fits) . Paralysis in arms or legs . Temperature above 100 F . Vomiting . Severe headache . Blood or clear fluid dripping from the nose or ears . Stiffness of the neck . Dizziness or blurred vision . Pulsating pain in the eye . Unequal pupils of eye . Personality changes . Any other unusual symptoms  PRECAUTIONS . Keep head elevated at all times for the first 24 hours (Elevate mattress if pillow is ineffective) . Do not take tranquilizers, sedatives, narcotics or alcohol . Avoid aspirin. Use only acetaminophen (e.g. Tylenol) or ibuprofen (e.g. Advil) for relief of pain. Follow directions on the bottle for dosage. . Use ice packs for comfort  MEDICATIONS Use medications only as directed by your  physician  Concussion Direct trauma to the head often causes a condition known as a concussion. This injury will interfere with brain function and may cause you to lose consciousness. The consequences of a concussion are usually temporary, but repetitive concussions can be very dangerous. If you have multiple concussions, you will have a greater risk of long-term effects, such as slurred speech, slow movements, impaired thinking, or tremors. The severity of a concussion is based on the length and severity of the interference with brain activity.  SYMPTOMS  Symptoms of a concussion vary depending on the severity of the injury. Very mild concussions may even occur without any noticeable symptoms. Swelling in the area of the injury is not related to the seriousness of the injury.   CAUSES  A concussion is the result of trauma to the head. When the head is subjected to such an injury, the brain strikes against the inner wall of the skull. This impact is what causes the damage to the brain. The force of injury is related to severity of injury. The most severe concussions are associated with incidents that involve large impact forces such as motor vehicle accidents. Wearing a helmet will reduce the severity of trauma to the head, but concussions may still occur if you are wearing a helmet.  RISK INCREASES WITH:  Contact sports (football, hockey, rugby, or lacrosse).  Fighting sports (martial arts or boxing).  Riding bicycles, motorcycles, or horses (when you ride without a helmet).     PREVENTION  Wear proper protective headgear and ensure correct fit.  Wear seat belts when driving and riding in a car.  Do not drink or use mind-altering drugs and drive.   PROGNOSIS  Concussions are typically curable if they are recognized and treated early. If a severe concussion or multiple concussions go untreated, then the complications may be life-threatening or cause permanent disability and brain  damage.  RELATED COMPLICATIONS   Permanent brain damage (slurred speech, slow movement, impaired thinking, or tremors).  Bleeding under the skull (subdural hemorrhage or hematoma, epidural hematoma).  Bleeding into the brain.  Prolonged healing time if usual activities are resumed too soon.  Infection if skin over the concussion site is broken.  Increased risk of future concussions (less trauma is required for a second concussion than the first).    [facility] Low Glycemic Diet (Source: [employer], 2006)  Low Glycemic Foods (20-49) (Decrease risk of developing heart disease)  Best for Diabetes: Eat Mostly these  Breakfast Cereals: All-Bran All-Bran Fruit 'n Oats Fiber One Oatmeal (not instant) Oat bran  Fruits and fruit juices: (Limit to 1-2 servings per day) Apples Apricots (fresh & dried) Blackberries Blueberries Cherries Cranberries Peaches Pears Plums Prunes Grapefruit Raspberries Strawberries Tangerine  Juices: Apple juice Grapefruit juice Tomato juice  Beans and legumes (fresh-cooked): Black-eyed peas Butter beans Chick peas Lentils  Green beans Lima beans Kidney beans Navy beans Pinto beans Snow peas  Non-starchy vegetables: Asparagus, avocado, broccoli, cabbage, cauliflower, celery, cucumber, greens, lettuce, mushrooms, peppers, tomatoes, okra, onions, spinach, summer squash  Grains: Barley Bulgur Rye Wild rice  Nuts and oils : Almonds Peanuts Sunflower seeds Hazelnuts Pecans Walnuts Oils that are liquid at room temperature  Dairy, fish, meat, soy, and eggs: Milk, skim Lowfat cheese Yogurt, lowfat, fruit sugar sweetened Lean red meat Fish  Skinless chicken & [place] Shellfish Egg whites (up to 3 daily) Soy products  Egg yolks (up to 7 or _____ per week) Moderate Glycemic Foods (50-69)  OK sometimes with diabetes  Breakfast Cereals: Bran Buds Bran Chex Just Right Mini-Wheats  Special K Swiss  muesli  Fruits: Banana (under-ripe) Dates Figs Grapes Kiwi Mango Oranges Raisins  Fruit Juices: Cranberry juice Orange juice  Beans and legumes: [place]-type baked beans Canned pinto, kidney, or navy beans Green peas  Vegetables: Beets Carrots  Sweet potato Yam Corn on the cob  Breads: Pita (pocket) bread Oat bran bread Pumpernickel bread Rye bread Wheat bread, high fiber   Grains: Cornmeal Rice, brown Rice, white Couscous  Pasta: Macaroni Pizza, cheese Ravioli, meat filled Spaghetti, white   Nuts: Cashews Macadamia  Snacks: Chocolate Ice cream, lowfat Muffin Popcorn High Glycemic Foods (70-100)  Rare: Eat occaisionally with diabetes  THESE ARE THE WORST KIND OF FOODS FOR YOUR DIABETES  Breakfast Cereals: Cheerios Corn Chex Corn Flakes Cream of Wheat Grape Nuts Grape Nut Flakes Grits Nutri-Grain Puffed Rice Puffed Wheat Rice Chex Rice Krispies Shredded Wheat Team Total  Fruits: Pineapple Watermelon Banana (over-ripe) Beverages: Sodas, sweet tea, pineapple juice  Vegetables: Potato, baked, boiled, fried, mashed [place] fries Canned or frozen corn Parsnips Winter squash  Breads: Most breads (white and whole grain) Bagels Bread sticks Bread stuffing Kaiser roll Dinner rolls  Grains: Rice, instant Tapioca, with milk Candy and most cookies  Snacks: Donuts Corn chips Jelly beans Pretzels Pastries      Diabetes Mellitus and Nutrition When you have diabetes (diabetes mellitus), it is very important to have healthy eating habits because your blood sugar (  glucose) levels are greatly affected by what you eat and drink. Eating healthy foods in the appropriate amounts, at about the same times every day, can help you:  Control your blood glucose.  Lower your risk of heart disease.  Improve your blood pressure.  Reach or maintain a healthy weight.  Every person with diabetes is different, and each person has different needs for a meal plan.  Your health care provider may recommend that you work with a diet and nutrition specialist (dietitian) to make a meal plan that is best for you. Your meal plan may vary depending on factors such as:  The calories you need.  The medicines you take.  Your weight.  Your blood glucose, blood pressure, and cholesterol levels.  Your activity level.  Other health conditions you have, such as heart or kidney disease.  How do carbohydrates affect me? Carbohydrates affect your blood glucose level more than any other type of food. Eating carbohydrates naturally increases the amount of glucose in your blood. Carbohydrate counting is a method for keeping track of how many carbohydrates you eat. Counting carbohydrates is important to keep your blood glucose at a healthy level, especially if you use insulin or take certain oral diabetes medicines. It is important to know how many carbohydrates you can safely have in each meal. This is different for every person. Your dietitian can help you calculate how many carbohydrates you should have at each meal and for snack. Foods that contain carbohydrates include:  Bread, cereal, rice, pasta, and crackers.  Potatoes and corn.  Peas, beans, and lentils.  Milk and yogurt.  Fruit and juice.  Desserts, such as cakes, cookies, ice cream, and candy.  How does alcohol affect me? Alcohol can cause a sudden decrease in blood glucose (hypoglycemia), especially if you use insulin or take certain oral diabetes medicines. Hypoglycemia can be a life-threatening condition. Symptoms of hypoglycemia (sleepiness, dizziness, and confusion) are similar to symptoms of having too much alcohol. If your health care provider says that alcohol is safe for you, follow these guidelines:  Limit alcohol intake to no more than 1 drink per day for nonpregnant women and 2 drinks per day for men. One drink equals 12 oz of beer, 5 oz of wine, or 1 oz of hard liquor.  Do not drink on an  empty stomach.  Keep yourself hydrated with water, diet soda, or unsweetened iced tea.  Keep in mind that regular soda, juice, and other mixers may contain a lot of sugar and must be counted as carbohydrates.  What are tips for following this plan? Reading food labels  Start by checking the serving size on the label. The amount of calories, carbohydrates, fats, and other nutrients listed on the label are based on one serving of the food. Many foods contain more than one serving per package.  Check the total grams (g) of carbohydrates in one serving. You can calculate the number of servings of carbohydrates in one serving by dividing the total carbohydrates by 15. For example, if a food has 30 g of total carbohydrates, it would be equal to 2 servings of carbohydrates.  Check the number of grams (g) of saturated and trans fats in one serving. Choose foods that have low or no amount of these fats.  Check the number of milligrams (mg) of sodium in one serving. Most people should limit total sodium intake to less than 2,300 mg per day.  Always check the nutrition information of foods labeled as "  low-fat" or "nonfat". These foods may be higher in added sugar or refined carbohydrates and should be avoided.  Talk to your dietitian to identify your daily goals for nutrients listed on the label. Shopping  Avoid buying canned, premade, or processed foods. These foods tend to be high in fat, sodium, and added sugar.  Shop around the outside edge of the grocery store. This includes fresh fruits and vegetables, bulk grains, fresh meats, and fresh dairy. Cooking  Use low-heat cooking methods, such as baking, instead of high-heat cooking methods like deep frying.  Cook using healthy oils, such as olive, canola, or sunflower oil.  Avoid cooking with butter, cream, or high-fat meats. Meal planning  Eat meals and snacks regularly, preferably at the same times every day. Avoid going long periods of  time without eating.  Eat foods high in fiber, such as fresh fruits, vegetables, beans, and whole grains. Talk to your dietitian about how many servings of carbohydrates you can eat at each meal.  Eat 4-6 ounces of lean protein each day, such as lean meat, chicken, fish, eggs, or tofu. 1 ounce is equal to 1 ounce of meat, chicken, or fish, 1 egg, or 1/4 cup of tofu.  Eat some foods each day that contain healthy fats, such as avocado, nuts, seeds, and fish. Lifestyle   Check your blood glucose regularly.  Exercise at least 30 minutes 5 or more days each week, or as told by your health care provider.  Take medicines as told by your health care provider.  Do not use any products that contain nicotine or tobacco, such as cigarettes and e-cigarettes. If you need help quitting, ask your health care provider.  Work with a Social worker or diabetes educator to identify strategies to manage stress and any emotional and social challenges. What are some questions to ask my health care provider?  Do I need to meet with a diabetes educator?  Do I need to meet with a dietitian?  What number can I call if I have questions?  When are the best times to check my blood glucose? Where to find more information:  American Diabetes Association: diabetes.org/food-and-fitness/food  Academy of Nutrition and Dietetics: PokerClues.dk  [employer] of Diabetes and Digestive and Kidney Diseases (NIH): ContactWire.be Summary  A healthy meal plan will help you control your blood glucose and maintain a healthy lifestyle.  Working with a diet and nutrition specialist (dietitian) can help you make a meal plan that is best for you.  Keep in mind that carbohydrates and alcohol have immediate effects on your blood glucose levels. It is important to count carbohydrates and to use alcohol  carefully. This information is not intended to replace advice given to you by your health care provider. Make sure you discuss any questions you have with your health care provider. Document Released: 12/24/2004 Document Revised: 05/03/2016 Document Reviewed: 05/03/2016 Elsevier Interactive Patient Education  [employer].

## 2018-01-03 LAB — TESTOS,TOTAL,FREE AND SHBG (FEMALE)
Free Testosterone: 70.1 pg/mL (ref 30.0–135.0)
Sex Hormone Binding: 13 nmol/L — ABNORMAL LOW (ref 22–77)
TESTOSTERONE, TOTAL, LC-MS-MS: 309 ng/dL (ref 250–1100)

## 2018-01-12 ENCOUNTER — Ambulatory Visit (INDEPENDENT_AMBULATORY_CARE_PROVIDER_SITE_OTHER): Payer: Medicare Other | Admitting: Family Medicine

## 2018-01-12 DIAGNOSIS — E291 Testicular hypofunction: Secondary | ICD-10-CM

## 2018-01-12 MED ORDER — TESTOSTERONE CYPIONATE 200 MG/ML IM SOLN
150.0000 mg | INTRAMUSCULAR | Status: DC
  Administered 2018-01-12: 150 mg via INTRAMUSCULAR

## 2018-01-12 MED ORDER — TESTOSTERONE CYPIONATE 100 MG/ML IM SOLN: 150.0000 mg | INTRAMUSCULAR | Status: DC

## 2018-01-12 NOTE — Progress Notes (Signed)
Per orders of Dr. Damita Dunnings, injection of testosterone given by Modena Nunnery. Patient tolerated injection well.

## 2018-01-14 ENCOUNTER — Other Ambulatory Visit: Payer: Self-pay | Admitting: Family Medicine

## 2018-01-16 ENCOUNTER — Encounter: Payer: Self-pay | Admitting: Family Medicine

## 2018-01-16 ENCOUNTER — Ambulatory Visit (INDEPENDENT_AMBULATORY_CARE_PROVIDER_SITE_OTHER): Payer: Medicare Other | Admitting: Family Medicine

## 2018-01-16 VITALS — BP 100/60 | HR 57 | Temp 97.4°F | Ht 71.0 in | Wt 222.5 lb

## 2018-01-16 DIAGNOSIS — E1165 Type 2 diabetes mellitus with hyperglycemia: Secondary | ICD-10-CM | POA: Diagnosis not present

## 2018-01-16 DIAGNOSIS — IMO0002 Reserved for concepts with insufficient information to code with codable children: Secondary | ICD-10-CM

## 2018-01-16 DIAGNOSIS — M25511 Pain in right shoulder: Secondary | ICD-10-CM

## 2018-01-16 DIAGNOSIS — S060X0D Concussion without loss of consciousness, subsequent encounter: Secondary | ICD-10-CM

## 2018-01-16 DIAGNOSIS — I10 Essential (primary) hypertension: Secondary | ICD-10-CM

## 2018-01-16 DIAGNOSIS — Z23 Encounter for immunization: Secondary | ICD-10-CM | POA: Diagnosis not present

## 2018-01-16 DIAGNOSIS — R42 Dizziness and giddiness: Secondary | ICD-10-CM | POA: Diagnosis not present

## 2018-01-16 DIAGNOSIS — E1142 Type 2 diabetes mellitus with diabetic polyneuropathy: Secondary | ICD-10-CM

## 2018-01-16 MED ORDER — FINASTERIDE 5 MG PO TABS: 5.0000 mg | ORAL_TABLET | Freq: Every day | ORAL | 2 refills | Status: DC

## 2018-01-16 MED ORDER — ATENOLOL 50 MG PO TABS: 50.0000 mg | ORAL_TABLET | Freq: Every day | ORAL | 1 refills | Status: DC

## 2018-01-16 NOTE — Progress Notes (Signed)
Dr. Frederico Hamman T. Kolbee Bogusz, MD, [facility] [place], [address] Phone: [phone] Fax: [phone]  01/16/2018  Patient: [name], [name]: [id number], DOB: 1946/03/13, 72 y.o.  Primary Physician:  [name], MD   Chief Complaint  Patient presents with  . Follow-up    Concussion   Subjective:   [name] is a 72 y.o. very pleasant male patient who presents with the following:  F/u fall and CHI. his multiple symptoms from closed head injury and fall have improved.  He is cognitively back to baseline.  He is not having any nausea, headache, confusion, irritability, or other changes from his baseline state.  He is still having some occasional problems with lightheadedness.  He does feel like his blood pressure might be getting a little bit too low.  He is cut out all salt in his diet.  Recently his blood pressure medicine was increased by 1 of my partners.  He also is having some problems with his stream and having some difficulties going to the bathroom more frequently as well as at nighttime with some occasional incontinence.  Is currently on doxazosin.  HA concussion, nausea is much better.  Balance is better.   BP is low today and lightheaded.   R shoulder still in a lot of pain.  In his fall, the patient also sustained a right sided shoulder injury without evidence of fracture.  He still having quite a bit of pain with this and having difficulty abduction, flexion, as well as external rotation.  Lab Results  Component Value Date   HGBA1C 11.9 (H) 12/29/2017    Incont, increased    Past Medical History, Surgical History, Social History, Family History, Problem List, Medications, and Allergies have been reviewed and updated if relevant.  Patient Active Problem List   Diagnosis Date Noted  . Uncontrolled type 2 diabetes mellitus with peripheral neuropathy ([place]) 05/11/2016    Priority: Medium  .  Former very heavy cigarette smoker (more than 40 per day) 10/07/2014    Priority: Medium  . Chronic kidney disease, stage III (moderate) ([place]) 12/19/2012    Priority: Medium  . OSA (obstructive sleep apnea) 03/09/2011    Priority: Medium  . Diabetic neuropathy ([place]) 12/29/2010    Priority: Medium  . Hypertriglyceridemia 06/13/2008    Priority: Medium  . Major depressive disorder, recurrent episode, in partial remission ([place]) 06/13/2008    Priority: Medium  . Essential hypertension 06/13/2008    Priority: Medium  . Falls 10/14/2017  . Esophageal dysphagia   . Mild cognitive disorder 06/29/2016  . Erectile dysfunction associated with type 2 diabetes mellitus ([place]) 05/28/2015  . Tachycardia 09/10/2014  . Hypogonadism male 06/14/2012  . Obesity (BMI 30-39.9) 09/27/2011  . Family history of MI (myocardial infarction) 09/16/2010  . HIP REPLACEMENT, RIGHT, HX OF 02/05/2010  . GOUT 06/13/2008  . ALLERGIC RHINITIS 06/13/2008  . GERD 06/13/2008  . OSTEOARTHRITIS 06/13/2008    Past Medical History:  Diagnosis Date  . Allergic rhinitis due to pollen   . Chronic kidney disease, stage III (moderate) ([place]) 12/19/2012  . Depression   . Diabetes mellitus ([place])   . Diverticulosis   . Erectile dysfunction associated with type 2 diabetes mellitus ([place]) 05/28/2015  . Former very heavy cigarette smoker (more than 40 per day) 10/07/2014  . GERD (gastroesophageal reflux disease)   . Gout   . Headache   . Hyperlipidemia   . Hypertension   .  Hypogonadism male 06/14/2012  . Iron deficiency   . Low testosterone   . Memory loss   . Osteoarthritis   . Personal history of colonic polyps    1996    Past Surgical History:  Procedure Laterality Date  . CARDIAC CATHETERIZATION  11/2011   [facility]  . CHOLECYSTECTOMY  1980  . ESOPHAGOGASTRODUODENOSCOPY (EGD) WITH PROPOFOL N/A 09/27/2017   Procedure: ESOPHAGOGASTRODUODENOSCOPY (EGD) WITH PROPOFOL;  Surgeon: Lin Landsman, MD;  Location: [facility];   Service: Gastroenterology;  Laterality: N/A;  . EYE SURGERY Right 07/29/2016   cataract - Dr. Manuella Ghazi  . EYE SURGERY Left 08/23/2106   cataract - Dr. Manuella Ghazi  . TOTAL HIP ARTHROPLASTY  2009   Dr. Gladstone Lighter  . TOTAL HIP ARTHROPLASTY      Social History   Socioeconomic History  . Marital status: Single    Spouse name: Not on file  . Number of children: 4  . Years of education: 12th  . Highest education level: Not on file  Occupational History  . Occupation: Counsellor  . 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 Financial resource strain: Not on file  . Food insecurity:    Worry: Not on file    Inability: Not on file  . Transportation needs:    Medical: Not on file    Non-medical: Not on file  Tobacco Use  . Smoking status: Former Smoker    Packs/day: 3.00    Years: 35.00    Pack years: 105.00    Types: Cigarettes    Last attempt to quit: 04/12/2000    Years since quitting: 17.7  . Smokeless tobacco: Never Used  Substance and Sexual Activity  . Alcohol use: No  . Drug use: No  . Sexual activity: Never  Lifestyle  . Physical activity:    Days per week: Not on file    Minutes per session: Not on file  . Stress: Not on file  Relationships  . Social connections:    Talks on phone: Not on file    Gets together: Not on file    Attends religious service: Not on file    Active member of club or organization: Not on file    Attends meetings of clubs or organizations: Not on file    Relationship status: Not on file  . Intimate partner violence:    Fear of current or ex partner: Not on file    Emotionally abused: Not on file    Physically abused: Not on file    Forced sexual activity: Not on file  Other Topics Concern  . Not on file  Social History Narrative   No regular exercise   Caffeine use: yes, dt coke   Lives at home with his wife and his mother lives with him.   Right-handed.    Family History  Problem Relation Age of  Onset  . Diabetes Mother   . Alzheimer's disease Mother   . Diabetes Father   . Lung cancer Father        Age 33  . Stroke Father   . Heart attack Father   . Lung cancer Sister        lung  . Heart disease Maternal Grandfather   . COPD Sister   . Heart attack Sister   . Heart disease Sister     Allergies  Allergen Reactions  . Tetracycline Itching and Rash    Medication list reviewed and updated  in full in [facility].   GEN: No acute illnesses, no fevers, chills. GI: No n/v/d, eating normally Pulm: No SOB Interactive and getting along well at home.  Otherwise, ROS is as per the HPI.  Objective:   BP 100/60   Pulse (!) 57   Temp (!) 97.4 F (36.3 C) (Oral)   Ht 5\' 11"  (1.803 m)   Wt 222 lb 8 oz (100.9 kg)   SpO2 98%   BMI 31.03 kg/m   GEN: WDWN, NAD, Non-toxic, A & O x 3 HEENT: Atraumatic, Normocephalic. Neck supple. No masses, No LAD. Ears and Nose: No external deformity. CV: RRR, No M/G/R. No JVD. No thrill. No extra heart sounds. PULM: CTA B, no wheezes, crackles, rhonchi. No retractions. No resp. distress. No accessory muscle use. EXTR: No c/c/e NEURO Normal gait.  PSYCH: Normally interactive. Conversant. Not depressed or anxious appearing.  Calm demeanor.   Right shoulder: Grossly nontender to palpation along the clavicle, acromioclavicular joint, and he does have some tenderness in the bicipital groove.  Nontender along the humerus.  Patient is able to actively flex his shoulder to approximately 70 degrees with some elevation of his scapula on the right.  Flexion to approximately 70 degrees. Strength is 3+/5 in the plane of abduction and flexion. External rotation is 4-/5 and causes pain.  Internal rotation is 4+/5.  Laboratory and Imaging Data:  Assessment and Plan:   Lightheaded  Need for prophylactic vaccination and inoculation against influenza - Plan: Flu Vaccine QUAD 6+ mos PF IM (Fluarix Quad PF)  Essential  hypertension  Uncontrolled type 2 diabetes mellitus with peripheral neuropathy ([place])  Acute pain of right shoulder  Concussion without loss of consciousness, subsequent encounter  Concern for hypotension, high risk for fall.  Drop beta-blocker dosage in half.  Diabetes is wildly out of control.  Again I have urged him to see endocrinology, compliance is been an issue.  I am concerned that he has a full-thickness versus high-grade thickness rotator cuff tear.  At this point with a A1c of 12 he is not really a surgical candidate, and he does not want surgery.  Were going to do the best we can with this, and he is going to work on his range of motion for the next few weeks and follow-up with me in 1 month.  It seems that he has improved in regards to his close head injury and concussion and is now asymptomatic.  Follow-up: Return in about 1 month (around 02/16/2018).  Meds ordered this encounter  Medications  . atenolol (TENORMIN) 50 MG tablet    Sig: Take 1 tablet (50 mg total) by mouth daily.    Dispense:  90 tablet    Refill:  1  . finasteride (PROSCAR) 5 MG tablet    Sig: Take 1 tablet (5 mg total) by mouth daily.    Dispense:  30 tablet    Refill:  2   Orders Placed This Encounter  Procedures  . Flu Vaccine QUAD 6+ mos PF IM (Fluarix Quad PF)    Signed,  Euclide Granito T. Jalik Gellatly, MD   Allergies as of 01/16/2018      Reactions   Tetracycline Itching, Rash      Medication List        Accurate as of 01/16/18  1:58 PM. Always use your most recent med list.          aspirin EC 81 MG tablet Take 81 mg by mouth at bedtime.  atenolol 50 MG tablet Commonly known as:  TENORMIN Take 1 tablet (50 mg total) by mouth daily.   buPROPion 150 MG 12 hr tablet Commonly known as:  WELLBUTRIN SR TAKE 1 TABLET BY MOUTH TWICE A DAY   cholecalciferol 1000 units tablet Commonly known as:  VITAMIN D Take 1,000 Units by mouth daily.   donepezil 10 MG tablet Commonly known as:   ARICEPT Take 1 tablet (10 mg total) by mouth at bedtime.   doxazosin 8 MG tablet Commonly known as:  CARDURA Take 0.5 tablets (4 mg total) by mouth at bedtime.   finasteride 5 MG tablet Commonly known as:  PROSCAR Take 1 tablet (5 mg total) by mouth daily.   fluticasone 50 MCG/ACT nasal spray Commonly known as:  FLONASE INHALE 2 SPRAYS INTO EACH NOSTRIL EVERY DAY AS NEEDED FOR CONGESTION   gabapentin 300 MG capsule Commonly known as:  NEURONTIN Take 1 capsule (300 mg total) by mouth 2 (two) times daily.   ibuprofen 200 MG tablet Commonly known as:  ADVIL,MOTRIN Take 200-400 mg by mouth every 6 (six) hours as needed for headache (pain).   insulin NPH Human 100 UNIT/ML injection Commonly known as:  HUMULIN N,NOVOLIN N Inject 0.25 mLs (25 Units total) into the skin 2 (two) times daily before a meal.   insulin regular 100 units/mL injection Commonly known as:  NOVOLIN R,HUMULIN R Inject 0.2 mLs (20 Units total) into the skin 2 (two) times daily before a meal.   lisinopril 10 MG tablet Commonly known as:  PRINIVIL,ZESTRIL Take 1 tablet (10 mg total) by mouth daily.   memantine 10 MG tablet Commonly known as:  NAMENDA Take 1 tablet (10 mg total) by mouth 2 (two) times daily.   nystatin cream Commonly known as:  MYCOSTATIN Apply 1 application topically 2 (two) times daily.   omeprazole 40 MG capsule Commonly known as:  PRILOSEC TAKE 1 CAPSULE (40 MG TOTAL) BY MOUTH 2 (TWO) TIMES DAILY BEFORE A MEAL.   sildenafil 20 MG tablet Commonly known as:  REVATIO Generic Revatio / Sildanefil 20 mg. 2 - 5 tabs 30 mins prior to intercourse.   simvastatin 40 MG tablet Commonly known as:  ZOCOR Take 1 tablet (40 mg total) by mouth at bedtime.   testosterone cypionate 200 MG/ML injection Commonly known as:  DEPOTESTOSTERONE CYPIONATE Inject 150 mg every 14 days   triamcinolone cream 0.1 % Commonly known as:  KENALOG   venlafaxine XR 75 MG 24 hr capsule Commonly known as:   EFFEXOR-XR TAKE ONE CAPSULE (75 MG) BY MOUTH EVERY MORNING AND TWO CAPSULES (150 MG) AT NIGHT

## 2018-01-16 NOTE — Patient Instructions (Signed)
Cut the Atenolol 100 mg tablets in half until your new dose comes from [place].

## 2018-01-26 ENCOUNTER — Ambulatory Visit (INDEPENDENT_AMBULATORY_CARE_PROVIDER_SITE_OTHER): Payer: Medicare Other | Admitting: *Deleted

## 2018-01-26 DIAGNOSIS — E291 Testicular hypofunction: Secondary | ICD-10-CM | POA: Diagnosis not present

## 2018-01-26 MED ORDER — TESTOSTERONE CYPIONATE 200 MG/ML IM SOLN
150.0000 mg | INTRAMUSCULAR | Status: DC
  Administered 2018-01-26 – 2018-03-08 (×2): 150 mg via INTRAMUSCULAR

## 2018-01-26 NOTE — Progress Notes (Signed)
Agree. Thanks

## 2018-01-26 NOTE — Progress Notes (Signed)
Per orders of Dr. Damita Dunnings, injection of testosterone given by Modena Nunnery. Patient tolerated injection well.

## 2018-01-31 ENCOUNTER — Ambulatory Visit (INDEPENDENT_AMBULATORY_CARE_PROVIDER_SITE_OTHER): Payer: Medicare Other | Admitting: Gastroenterology

## 2018-01-31 DIAGNOSIS — R1310 Dysphagia, unspecified: Secondary | ICD-10-CM

## 2018-01-31 DIAGNOSIS — K219 Gastro-esophageal reflux disease without esophagitis: Secondary | ICD-10-CM

## 2018-01-31 DIAGNOSIS — R1319 Other dysphagia: Secondary | ICD-10-CM

## 2018-01-31 MED ORDER — OMEPRAZOLE 40 MG PO CPDR: 40.0000 mg | DELAYED_RELEASE_CAPSULE | Freq: Two times a day (BID) | ORAL | 0 refills | Status: DC

## 2018-01-31 NOTE — Progress Notes (Signed)
Dustin Darby, MD [address]  [place]  [place], [place] [address]  Main: [phone]  Fax: [phone]    Gastroenterology Consultation  Referring Provider:     [name], MD Primary Care Physician:  [name], MD Primary Gastroenterologist:  Dr. Cephas [name] Reason for Consultation:     Dysphagia, heartburn and diarrhea        HPI:   [name] is a 72 y.o. male referred by Dr. [name], MD  for consultation & management of and dysphagia, heartburn and altered bowel habits  Dysphagia and heartburn: Patient reports a several years history of heartburn including nocturnal symptoms. He has been taking ranitidine and recently switched to omeprazole 20 mg daily. The benefit did not last more than few days. For the last 6 months, approximately since 04/2017 patient has been experiencing sensation of food stuck in his chest. He lost about 20 pounds in last 6 months. He drinks diet soda 6 cans every day. He has 100 pack years of smoking history  Altered bowel habits: His wife passed away in 20-Jun-2017, it was a sudden death from probable PE. Since then, he has been experiencing alternating episodes of constipation that lasts for 5 days followed by nonbloody diarrhea. He reports abdominal bloating, with incomplete emptying. He is now taking care of 68 year old mother who lives with him.  Follow-up visit 10/28/2017 He underwent EGD for dysphagia, unremarkable for eosinophilic esophagitis other than distal esophageal biopsies showed changes suggestive of reflux esophagitis. I started him on Prilosec 40 mg twice daily. He reports that his dysphagia is improving but continues to have sensation of food stuck in his lower esophagus.  Follow-up visit 01/31/2018 He denies any upper GI symptoms, including symptoms of reflux or dysphagia.  Currently taking Prilosec 40 mg twice daily.  He reports 1 week history of nonbloody diarrhea.  He took Imodium last night.  No  bowel movement yet.  He denies recent antibiotic use or food poisoning or dining out or sick contacts or recent illness.  He fell about 2 weeks ago, injured his shoulder, recovering from it.  NSAIDs: none  Antiplts/Anticoagulants/Anti thrombotics: Aspirin 81  GI Procedures:  EGD 09/27/2017 - Normal duodenal bulb and second portion of the duodenum. - Normal stomach. Biopsied. - Normal gastroesophageal junction and esophagus. Biopsied. DIAGNOSIS:  A. STOMACH; COLD BIOPSY:  - FOCAL MILD CHRONIC GASTRITIS OF OXYNTIC MUCOSA.  - NEGATIVE FOR H. PYLORI, DYSPLASIA AND MALIGNANCY.   B. DISTAL ESOPHAGUS; COLD BIOPSY:  - FEATURES SUGGESTIVE OF REFLUX.   C. PROXIMAL ESOPHAGUS; COLD BIOPSY:  - NO PATHOLOGIC CHANGE.   Colonoscopy in 2011 [facility] ENDOSCOPIC IMPRESSION:     1) Diverticulosis,mild,left sided diverticulosis     2) Three diminutive polyps removed     3) Otherwise normal examination except for small lipoma in     transverse colon.     4) Internal hemorrhoids     5) Personal history of polyps, reported by patient to be adenomas     in 1996 1. Colon, polyp(s), hepatic flexure, transverse and sigmoid colon : - HYPERPLASTIC POLYPS AND POLYPOID FRAGMENT OF BENIGN COLONIC MUCOSA. - NO ADENOMATOUS CHANGES OR MALIGNANCY IDENTIFIED.  Past Medical History:  Diagnosis Date  . Allergic rhinitis due to pollen   . Chronic kidney disease, stage III (moderate) ([place]) 12/19/2012  . Depression   . Diabetes mellitus ([place])   . Diverticulosis   . Erectile dysfunction associated with type 2 diabetes mellitus ([place]) 05/28/2015  .  Former very heavy cigarette smoker (more than 40 per day) 10/07/2014  . GERD (gastroesophageal reflux disease)   . Gout   . Headache   . Hyperlipidemia   . Hypertension   . Hypogonadism male 06/14/2012  . Iron deficiency   . Low testosterone   . Memory loss   . Osteoarthritis   . Personal history of colonic polyps    1996    Past Surgical History:  Procedure  Laterality Date  . CARDIAC CATHETERIZATION  11/2011   [facility]  . CHOLECYSTECTOMY  1980  . ESOPHAGOGASTRODUODENOSCOPY (EGD) WITH PROPOFOL N/A 09/27/2017   Procedure: ESOPHAGOGASTRODUODENOSCOPY (EGD) WITH PROPOFOL;  Surgeon: Lin Landsman, MD;  Location: [facility];  Service: Gastroenterology;  Laterality: N/A;  . EYE SURGERY Right 07/29/2016   cataract - Dr. Manuella Ghazi  . EYE SURGERY Left 08/23/2106   cataract - Dr. Manuella Ghazi  . TOTAL HIP ARTHROPLASTY  2009   Dr. Gladstone Lighter  . TOTAL HIP ARTHROPLASTY      Current Outpatient Medications:  .  aspirin EC 81 MG tablet, Take 81 mg by mouth at bedtime., Disp: , Rfl:  .  atenolol (TENORMIN) 50 MG tablet, Take 1 tablet (50 mg total) by mouth daily., Disp: 90 tablet, Rfl: 1 .  buPROPion (WELLBUTRIN SR) 150 MG 12 hr tablet, TAKE 1 TABLET BY MOUTH TWICE A DAY, Disp: 180 tablet, Rfl: 1 .  cholecalciferol (VITAMIN D) 1000 UNITS tablet, Take 1,000 Units by mouth daily.  , Disp: , Rfl:  .  donepezil (ARICEPT) 10 MG tablet, Take 1 tablet (10 mg total) by mouth at bedtime., Disp: 90 tablet, Rfl: 0 .  doxazosin (CARDURA) 8 MG tablet, Take 0.5 tablets (4 mg total) by mouth at bedtime., Disp: 45 tablet, Rfl: 3 .  finasteride (PROSCAR) 5 MG tablet, Take 1 tablet (5 mg total) by mouth daily., Disp: 30 tablet, Rfl: 2 .  fluticasone (FLONASE) 50 MCG/ACT nasal spray, INHALE 2 SPRAYS INTO EACH NOSTRIL EVERY DAY AS NEEDED FOR CONGESTION, Disp: 48 g, Rfl: 3 .  gabapentin (NEURONTIN) 300 MG capsule, Take 1 capsule (300 mg total) by mouth 2 (two) times daily., Disp: 180 capsule, Rfl: 0 .  insulin NPH Human (HUMULIN N,NOVOLIN N) 100 UNIT/ML injection, Inject 0.25 mLs (25 Units total) into the skin 2 (two) times daily before a meal., Disp: 10 mL, Rfl:  .  insulin regular (NOVOLIN R,HUMULIN R) 100 units/mL injection, Inject 0.2 mLs (20 Units total) into the skin 2 (two) times daily before a meal., Disp: 10 mL, Rfl:  .  lisinopril (PRINIVIL,ZESTRIL) 10 MG tablet, Take 1 tablet (10 mg  total) by mouth daily., Disp: 30 tablet, Rfl: 3 .  memantine (NAMENDA) 10 MG tablet, Take 1 tablet (10 mg total) by mouth 2 (two) times daily., Disp: 180 tablet, Rfl: 0 .  sildenafil (REVATIO) 20 MG tablet, Generic Revatio / Sildanefil 20 mg. 2 - 5 tabs 30 mins prior to intercourse., Disp: 10 tablet, Rfl: prn .  simvastatin (ZOCOR) 40 MG tablet, Take 1 tablet (40 mg total) by mouth at bedtime., Disp: 90 tablet, Rfl: 0 .  testosterone cypionate (DEPOTESTOSTERONE CYPIONATE) 200 MG/ML injection, Inject 150 mg every 14 days, Disp: 10 mL, Rfl: 1 .  venlafaxine XR (EFFEXOR-XR) 75 MG 24 hr capsule, TAKE ONE CAPSULE (75 MG) BY MOUTH EVERY MORNING AND TWO CAPSULES (150 MG) AT NIGHT, Disp: 270 capsule, Rfl: 0 .  ibuprofen (ADVIL,MOTRIN) 200 MG tablet, Take 200-400 mg by mouth every 6 (six) hours as needed for headache (pain).,  Disp: , Rfl:  .  nystatin cream (MYCOSTATIN), Apply 1 application topically 2 (two) times daily. (Patient not taking: Reported on 01/31/2018), Disp: 30 g, Rfl: 0 .  omeprazole (PRILOSEC) 40 MG capsule, Take 1 capsule (40 mg total) by mouth 2 (two) times daily before a meal., Disp: 180 capsule, Rfl: 0 .  triamcinolone cream (KENALOG) 0.1 %, , Disp: , Rfl:   Current Facility-Administered Medications:  .  testosterone cypionate (DEPOTESTOSTERONE CYPIONATE) injection 150 mg, 150 mg, Intramuscular, Q14 Days, Tonia Ghent, MD, 150 mg at 01/26/18 0907    Family History  Problem Relation Age of Onset  . Diabetes Mother   . Alzheimer's disease Mother   . Diabetes Father   . Lung cancer Father        Age 72  . Stroke Father   . Heart attack Father   . Lung cancer Sister        lung  . Heart disease Maternal Grandfather   . COPD Sister   . Heart attack Sister   . Heart disease Sister      Social History   Tobacco Use  . Smoking status: Former Smoker    Packs/day: 3.00    Years: 35.00    Pack years: 105.00    Types: Cigarettes    Last attempt to quit: 04/12/2000    Years  since quitting: 17.8  . Smokeless tobacco: Never Used  Substance Use Topics  . Alcohol use: No  . Drug use: No    Allergies as of 01/31/2018 - Review Complete 01/31/2018  Allergen Reaction Noted  . Tetracycline Itching and Rash 06/13/2008    Review of Systems:    All systems reviewed and negative except where noted in HPI.   Physical Exam:  BP (!) 164/85 (BP Location: Left Arm, Patient Position: Sitting, Cuff Size: Large)  No LMP for male patient.  General:   Alert,  Well-developed, well-nourished, pleasant and cooperative in NAD Head:  Normocephalic and atraumatic. Eyes:  Sclera clear, no icterus.   Conjunctiva pink. Ears:  Normal auditory acuity. Nose:  No deformity, discharge, or lesions. Mouth:  No deformity or lesions,oropharynx pink & moist. Neck:  Supple; no masses or thyromegaly. Lungs:  Respirations even and unlabored.  Clear throughout to auscultation.   No wheezes, crackles, or rhonchi. No acute distress. Heart:  Regular rate and rhythm; no murmurs, clicks, rubs, or gallops. Abdomen:  Normal bowel sounds. Soft, non-tender and distended, tympanitic without masses, hepatosplenomegaly or hernias noted.  No guarding or rebound tenderness.   Rectal: Not performed Msk:  Symmetrical without gross deformities. Good, equal movement & strength bilaterally. Pulses:  Normal pulses noted. Extremities:  No clubbing or edema.  No cyanosis. Neurologic:  Alert and oriented x3;  grossly normal neurologically. Skin:  Intact without significant lesions or rashes. No jaundice. Psych:  Alert and cooperative. Normal mood and affect.  Imaging Studies: No abdominal imaging  Assessment and Plan:   AEON KESSNER is a 72 y.o. Caucasian male with metabolic syndrome, 778 pack years of tobacco, chronic GERD follow-up for 6 month history of dysphagia and GERD  Chronic GERD and Dysphagia: symptoms resolved EGD revealed reflux esophagitis on biopsies, negative for EOE continue omeprazole to  40 mg twice daily for next 3 months, then decrease to once daily.  Suggested him to go back up to twice daily if symptoms recur  Acute diarrhea, nonbloody: Probably viral Suggested him to call my office back if diarrhea recurs, will order stool studies  Due for surveillance colonoscopy in 2021  Follow up in 6 months   Dustin Darby, MD

## 2018-02-08 NOTE — Progress Notes (Signed)
Agree. Thanks

## 2018-02-09 ENCOUNTER — Ambulatory Visit (INDEPENDENT_AMBULATORY_CARE_PROVIDER_SITE_OTHER): Payer: Medicare Other

## 2018-02-09 DIAGNOSIS — E291 Testicular hypofunction: Secondary | ICD-10-CM

## 2018-02-09 MED ORDER — TESTOSTERONE CYPIONATE 200 MG/ML IM SOLN
150.0000 mg | INTRAMUSCULAR | Status: DC
  Administered 2018-02-09: 150 mg via INTRAMUSCULAR

## 2018-02-12 ENCOUNTER — Other Ambulatory Visit: Payer: Self-pay | Admitting: Family Medicine

## 2018-02-13 NOTE — Telephone Encounter (Signed)
Last office visit 01/16/2018 for follow up.   Last refilled 11/14/2017 for #180 with no refills.  Follow up scheduled 02/20/18.

## 2018-02-17 ENCOUNTER — Telehealth: Payer: Self-pay | Admitting: Family Medicine

## 2018-02-17 NOTE — Telephone Encounter (Signed)
Tried calling pt  No answer. If pt calls back please r/s his 11/11 appointment with dr copland.  Offer dr Raeford Razor @ [facility].  It pt wants to wait for dr copland schedule in [place]

## 2018-02-20 ENCOUNTER — Ambulatory Visit: Payer: Medicare Other | Admitting: Family Medicine

## 2018-02-22 ENCOUNTER — Ambulatory Visit (INDEPENDENT_AMBULATORY_CARE_PROVIDER_SITE_OTHER): Payer: Medicare Other | Admitting: Primary Care

## 2018-02-22 ENCOUNTER — Ambulatory Visit: Payer: Medicare Other | Admitting: Internal Medicine

## 2018-02-22 DIAGNOSIS — E291 Testicular hypofunction: Secondary | ICD-10-CM | POA: Diagnosis not present

## 2018-02-22 MED ORDER — TESTOSTERONE CYPIONATE 200 MG/ML IM SOLN
150.0000 mg | Freq: Once | INTRAMUSCULAR | Status: AC
  Administered 2018-02-22: 150 mg via INTRAMUSCULAR

## 2018-02-22 NOTE — Progress Notes (Signed)
Per orders of Dr. Lorelei Pont, injection of Testosterone given by Lauralyn Primes. Patient tolerated injection well.

## 2018-02-23 ENCOUNTER — Encounter: Payer: Self-pay | Admitting: Family Medicine

## 2018-02-23 ENCOUNTER — Ambulatory Visit (INDEPENDENT_AMBULATORY_CARE_PROVIDER_SITE_OTHER): Payer: Medicare Other | Admitting: Family Medicine

## 2018-02-23 VITALS — BP 146/76 | HR 86 | Temp 97.6°F | Ht 71.0 in | Wt 224.8 lb

## 2018-02-23 DIAGNOSIS — R42 Dizziness and giddiness: Secondary | ICD-10-CM | POA: Diagnosis not present

## 2018-02-23 DIAGNOSIS — R2689 Other abnormalities of gait and mobility: Secondary | ICD-10-CM

## 2018-02-23 NOTE — Assessment & Plan Note (Signed)
Ongoing/multifactorial -pt with DM/HTN/lipidemia/obesity/neuropathy and memory issues  Urged strongly to use walker when needed and use caution   Episodic dizziness is more concerning (with falls)  Ref to neuro  Once that is situated -may benefit from PT for balance

## 2018-02-23 NOTE — Assessment & Plan Note (Signed)
Pt of Dr Lorelei Pont  Episodic dizziness (describes as light headed) - about twice weekly (while active) since 2016 when he had a TIA One episode of syncope several mo ago (was worked up)-pt unsure if related  To date- bp or blood glucose fluctuation has not seemed to correlate with episodes and he has not been orthostatic  Reassuring exam today - but generally poor balance  Enc strongly to use walker Have good health habits (of note-can walk at the mall w/o problem)  Also get back to cpap machine immediately (agreed)  Will ref to neurology for further eval and tx

## 2018-02-23 NOTE — Patient Instructions (Addendum)
Get back to using your cpap every night  Also see about a follow up with sleep specialist   I want to refer you to neurology for the episodes of light headedness as well as poor balance There may be several factors involved   Try hard to stick to a diabetic diet  Watch your blood pressure and sugar -especially when you feel light headed Make sure to drink fluids  Make sure you get protein with every meal   Use walker when needed for balance    For cholesterol  Avoid red meat/ fried foods/ egg yolks/ fatty breakfast meats/ butter, cheese and high fat dairy/ and shellfish   For diabetes Try to get most of your carbohydrates from produce (with the exception of white potatoes)  Eat less bread/pasta/rice/snack foods/cereals/sweets and other items from the middle of the grocery store (processed carbs)   If viagra makes you dizzy-hold it for now

## 2018-02-23 NOTE — Progress Notes (Signed)
Subjective:    Patient ID: [name], male    DOB: 01-04-1946, 72 y.o.   [name]: [id number]  HPI 72 yo pt of Dr Lorelei Pont here for chronic balance problems   Wt Readings from Last 3 Encounters:  02/23/18 224 lb 12 oz (101.9 kg)  01/16/18 222 lb 8 oz (100.9 kg)  01/02/18 223 lb 12 oz (101.5 kg)   31.35 kg/m  BP Readings from Last 3 Encounters:  02/23/18 (!) 146/76  01/31/18 (!) 164/85  01/16/18 100/60   Pulse Readings from Last 3 Encounters:  02/23/18 86  01/16/18 (!) 57  01/02/18 91   Episodes of feeling off balance  Has fallen trying to get to a chair   Several mo ago- syncope for ? Reason  Neg w/u -no cause found   bp at home fluctuates - lowest 114/70   No change in medicines except - less atenolol   Describes an "empty" feeling in head-lightheaded (episodic since 2016)  Then weak all over  Does not feel like he is spinning No cva symptoms- no speech problems or facial droop or focal weakness or numbness Last MRI 2016- small vessel  Has neuropathy in feet   No new cp - has had fleeting in the past  No sob on exertion Hx of carotid stenosis   Episodes about 2 times per week  Tend to happen during the day more than at night    Saw neurology about a year ago - diag with some memory issues  Dr Krista Blue  Memory issues are stable   Had a mini stroke years ago  Has DM/ HTN/ cholesterol   Has OSA- not using his cpap machine (thinks it is ok)  Got tired of cleaning tubes/etc  Saw Dr Gwenette Greet in the past   DM- does well if he watches what he eats Lab Results  Component Value Date   HGBA1C 11.9 (H) 12/29/2017  does not really watch diet  No hypoglycemia lately (does not go under 100)  Stays high more than low    Patient Active Problem List   Diagnosis Date Noted  . Episode of dizziness 02/23/2018  . Poor balance 02/23/2018  . Falls 10/14/2017  . Esophageal dysphagia   . Mild cognitive disorder 06/29/2016  . Uncontrolled type 2 diabetes mellitus with  peripheral neuropathy ([place]) 05/11/2016  . Erectile dysfunction associated with type 2 diabetes mellitus ([name]) 05/28/2015  . Former very heavy cigarette smoker (more than 40 per day) 10/07/2014  . Tachycardia 09/10/2014  . Chronic kidney disease, stage III (moderate) ([place]) 12/19/2012  . Hypogonadism male 06/14/2012  . Obesity (BMI 30-39.9) 09/27/2011  . OSA (obstructive sleep apnea) 03/09/2011  . Diabetic neuropathy ([place]) 12/29/2010  . Family history of MI (myocardial infarction) 09/16/2010  . HIP REPLACEMENT, RIGHT, HX OF 02/05/2010  . Hypertriglyceridemia 06/13/2008  . GOUT 06/13/2008  . Major depressive disorder, recurrent episode, in partial remission ([place]) 06/13/2008  . Essential hypertension 06/13/2008  . ALLERGIC RHINITIS 06/13/2008  . GERD 06/13/2008  . OSTEOARTHRITIS 06/13/2008   Past Medical History:  Diagnosis Date  . Allergic rhinitis due to pollen   . Chronic kidney disease, stage III (moderate) ([place]) 12/19/2012  . Depression   . Diabetes mellitus ([place])   . Diverticulosis   . Erectile dysfunction associated with type 2 diabetes mellitus ([place]) 05/28/2015  . Former very heavy cigarette smoker (more than 40 per day) 10/07/2014  . GERD (gastroesophageal reflux disease)   . Gout   . Headache   .  Hyperlipidemia   . Hypertension   . Hypogonadism male 06/14/2012  . Iron deficiency   . Low testosterone   . Memory loss   . Osteoarthritis   . Personal history of colonic polyps    1996   Past Surgical History:  Procedure Laterality Date  . CARDIAC CATHETERIZATION  11/2011   [facility]  . CHOLECYSTECTOMY  1980  . ESOPHAGOGASTRODUODENOSCOPY (EGD) WITH PROPOFOL N/A 09/27/2017   Procedure: ESOPHAGOGASTRODUODENOSCOPY (EGD) WITH PROPOFOL;  Surgeon: Lin Landsman, MD;  Location: [place];  Service: Gastroenterology;  Laterality: N/A;  . EYE SURGERY Right 07/29/2016   cataract - Dr. Manuella Ghazi  . EYE SURGERY Left 08/23/2106   cataract - Dr. Manuella Ghazi  . TOTAL HIP ARTHROPLASTY  2009    Dr. Gladstone Lighter  . TOTAL HIP ARTHROPLASTY     Social History   Tobacco Use  . Smoking status: Former Smoker    Packs/day: 3.00    Years: 35.00    Pack years: 105.00    Types: Cigarettes    Last attempt to quit: 04/12/2000    Years since quitting: 17.8  . Smokeless tobacco: Never Used  Substance Use Topics  . Alcohol use: No  . Drug use: No   Family History  Problem Relation Age of Onset  . Diabetes Mother   . Alzheimer's disease Mother   . Diabetes Father   . Lung cancer Father        Age 64  . Stroke Father   . Heart attack Father   . Lung cancer Sister        lung  . Heart disease Maternal Grandfather   . COPD Sister   . Heart attack Sister   . Heart disease Sister    Allergies  Allergen Reactions  . Tetracycline Itching and Rash   Current Outpatient Medications on File Prior to Visit  Medication Sig Dispense Refill  . aspirin EC 81 MG tablet Take 81 mg by mouth at bedtime.    [name] Kitchen atenolol (TENORMIN) 50 MG tablet Take 1 tablet (50 mg total) by mouth daily. (Patient taking differently: Take 25 mg by mouth daily. ) 90 tablet 1  . buPROPion (WELLBUTRIN SR) 150 MG 12 hr tablet TAKE 1 TABLET BY MOUTH TWICE A DAY 180 tablet 1  . cholecalciferol (VITAMIN D) 1000 UNITS tablet Take 1,000 Units by mouth daily.      [name] Kitchen donepezil (ARICEPT) 10 MG tablet Take 1 tablet (10 mg total) by mouth at bedtime. 90 tablet 3  . doxazosin (CARDURA) 8 MG tablet Take 0.5 tablets (4 mg total) by mouth at bedtime. 45 tablet 3  . finasteride (PROSCAR) 5 MG tablet Take 1 tablet (5 mg total) by mouth daily. 30 tablet 2  . fluticasone (FLONASE) 50 MCG/ACT nasal spray INHALE 2 SPRAYS INTO EACH NOSTRIL EVERY DAY AS NEEDED FOR CONGESTION 48 g 3  . gabapentin (NEURONTIN) 300 MG capsule Take 1 capsule (300 mg total) by mouth 2 (two) times daily. 180 capsule 0  . ibuprofen (ADVIL,MOTRIN) 200 MG tablet Take 200-400 mg by mouth every 6 (six) hours as needed for headache (pain).    . insulin NPH Human (HUMULIN  N,NOVOLIN N) 100 UNIT/ML injection Inject 0.25 mLs (25 Units total) into the skin 2 (two) times daily before a meal. 10 mL   . insulin regular (NOVOLIN R,HUMULIN R) 100 units/mL injection Inject 0.2 mLs (20 Units total) into the skin 2 (two) times daily before a meal. 10 mL   . lisinopril (PRINIVIL,ZESTRIL) 10 MG  tablet Take 1 tablet (10 mg total) by mouth daily. 90 tablet 1  . memantine (NAMENDA) 10 MG tablet Take 1 tablet (10 mg total) by mouth 2 (two) times daily. 90 tablet 3  . nystatin cream (MYCOSTATIN) Apply 1 application topically 2 (two) times daily. 30 g 0  . omeprazole (PRILOSEC) 40 MG capsule Take 1 capsule (40 mg total) by mouth 2 (two) times daily before a meal. 180 capsule 0  . sildenafil (REVATIO) 20 MG tablet Generic Revatio / Sildanefil 20 mg. 2 - 5 tabs 30 mins prior to intercourse. 10 tablet prn  . simvastatin (ZOCOR) 40 MG tablet Take 1 tablet (40 mg total) by mouth at bedtime. 90 tablet 3  . testosterone cypionate (DEPOTESTOSTERONE CYPIONATE) 200 MG/ML injection Inject 150 mg every 14 days 10 mL 1  . triamcinolone cream (KENALOG) 0.1 %     . venlafaxine XR (EFFEXOR-XR) 75 MG 24 hr capsule TAKE ONE CAPSULE (75 MG) BY MOUTH EVERY MORNING AND TWO CAPSULES (150 MG) AT NIGHT 270 capsule 1   Current Facility-Administered Medications on File Prior to Visit  Medication Dose Route Frequency Provider Last Rate Last Dose  . testosterone cypionate (DEPOTESTOSTERONE CYPIONATE) injection 150 mg  150 mg Intramuscular Q14 Days Tonia Ghent, MD   150 mg at 01/26/18 5784  . testosterone cypionate (DEPOTESTOSTERONE CYPIONATE) injection 150 mg  150 mg Intramuscular Q14 Days Bedsole, Amy E, MD   150 mg at 02/09/18 1001     Review of Systems  Constitutional: Negative for activity change, appetite change, fatigue, fever and unexpected weight change.  HENT: Negative for congestion, ear discharge, ear pain, rhinorrhea, sore throat and trouble swallowing.   Eyes: Negative for pain, redness,  itching and visual disturbance.  Respiratory: Negative for cough, chest tightness, shortness of breath and wheezing.   Cardiovascular: Negative for chest pain, palpitations and leg swelling.  Gastrointestinal: Negative for abdominal pain, blood in stool, constipation, diarrhea and nausea.  Endocrine: Negative for cold intolerance, heat intolerance, polydipsia and polyuria.  Genitourinary: Negative for difficulty urinating, dysuria, frequency and urgency.  Musculoskeletal: Negative for arthralgias, joint swelling and myalgias.  Skin: Negative for pallor and rash.  Neurological: Positive for dizziness and light-headedness. Negative for tremors, seizures, syncope, facial asymmetry, speech difficulty, weakness, numbness and headaches.  Hematological: Negative for adenopathy. Does not bruise/bleed easily.  Psychiatric/Behavioral: Negative for decreased concentration and dysphoric mood. The patient is not nervous/anxious.        Objective:   Physical Exam  Constitutional: He is oriented to person, place, and time. He appears well-developed and well-nourished. No distress.  obese and well appearing   HENT:  Head: Normocephalic and atraumatic.  Right Ear: External ear normal.  Left Ear: External ear normal.  Nose: Nose normal.  Mouth/Throat: Oropharynx is clear and moist. No oropharyngeal exudate.  No sinus tenderness No temporal tenderness  No TMJ tenderness  TMs clear  Eyes: Pupils are equal, round, and reactive to light. Conjunctivae and EOM are normal. Right eye exhibits no discharge. Left eye exhibits no discharge. No scleral icterus.  No nystagmus  Neck: Normal range of motion and full passive range of motion without pain. Neck supple. No JVD present. Carotid bruit is not present. No tracheal deviation present. No thyromegaly present.  Cardiovascular: Normal rate, regular rhythm, normal heart sounds and intact distal pulses. Exam reveals no gallop.  No murmur heard. Pulmonary/Chest:  Effort normal and breath sounds normal. No respiratory distress. He has no wheezes. He has no rales.  No  crackles  Abdominal: Soft. Bowel sounds are normal. He exhibits no distension, no abdominal bruit and no mass. There is no tenderness.  Musculoskeletal: He exhibits no edema, tenderness or deformity.  Lymphadenopathy:    He has no cervical adenopathy.  Neurological: He is alert and oriented to person, place, and time. He has normal strength and normal reflexes. He displays tremor. He displays no atrophy and normal reflexes. No cranial nerve deficit or sensory deficit. He exhibits normal muscle tone. He displays no seizure activity. Gait abnormal. Coordination normal.  No focal cerebellar signs   Slow gait - but does lift feet Considerable difficulty with tandem gait   No pronator drift  Mild hand tremor of intention (not pill rolling)   Skin: Skin is warm and dry. No rash noted. No pallor.  Psychiatric: He has a normal mood and affect. His behavior is normal. Thought content normal.          Assessment & Plan:   Problem List Items Addressed This Visit      Other   Episode of dizziness    Pt of Dr Lorelei Pont  Episodic dizziness (describes as light headed) - about twice weekly (while active) since 2016 when he had a TIA One episode of syncope several mo ago (was worked up)-pt unsure if related  To date- bp or blood glucose fluctuation has not seemed to correlate with episodes and he has not been orthostatic  Reassuring exam today - but generally poor balance  Enc strongly to use walker Have good health habits (of note-can walk at the mall w/o problem)  Also get back to cpap machine immediately (agreed)  Will ref to neurology for further eval and tx        Relevant Orders   Ambulatory referral to Neurology   Poor balance    Ongoing/multifactorial -pt with DM/HTN/lipidemia/obesity/neuropathy and memory issues  Urged strongly to use walker when needed and use caution    Episodic dizziness is more concerning (with falls)  Ref to neuro  Once that is situated -may benefit from PT for balance      Relevant Orders   Ambulatory referral to Neurology

## 2018-02-27 ENCOUNTER — Telehealth: Payer: Self-pay | Admitting: Family Medicine

## 2018-02-27 DIAGNOSIS — W19XXXA Unspecified fall, initial encounter: Secondary | ICD-10-CM

## 2018-02-27 DIAGNOSIS — R42 Dizziness and giddiness: Secondary | ICD-10-CM

## 2018-02-27 DIAGNOSIS — H81399 Other peripheral vertigo, unspecified ear: Secondary | ICD-10-CM

## 2018-02-27 NOTE — Telephone Encounter (Signed)
MRI ordered  Will route to [place] and also [facility]  Thanks  Please set it up  Dr Lillie Fragmin pt  Unsure where he will want to do it

## 2018-02-27 NOTE — Telephone Encounter (Signed)
He has not had MRI of brain since 2016 (unless I am wrong) I would consider that if insurance would pay (is he ok with that? -I will order)  If dizziness is severe, however would consider ED visit   This is Dr Lillie Fragmin pt -he is not back in the office until December

## 2018-02-27 NOTE — Telephone Encounter (Signed)
Pt agrees with referral for MRI, please put order in and I advise pt our [facility] will call to schedule appt., pt will go to ER if sxs worsen in the mean time

## 2018-02-27 NOTE — Telephone Encounter (Signed)
Patient was seen for dizziness last week.  Patient said his dizziness has gotten worse.  It comes and goes, but it's been going on all morning.  Patient has an appointment with the Neurologist in January.  Is there anything patient can do until the appointment?

## 2018-03-01 NOTE — Telephone Encounter (Signed)
Called patient and he is scheduled at [facility].

## 2018-03-08 ENCOUNTER — Ambulatory Visit (INDEPENDENT_AMBULATORY_CARE_PROVIDER_SITE_OTHER): Payer: Medicare Other

## 2018-03-08 ENCOUNTER — Ambulatory Visit (INDEPENDENT_AMBULATORY_CARE_PROVIDER_SITE_OTHER): Payer: Medicare Other | Admitting: Family Medicine

## 2018-03-08 ENCOUNTER — Encounter: Payer: Self-pay | Admitting: Family Medicine

## 2018-03-08 VITALS — BP 122/72 | HR 112 | Temp 97.9°F | Ht 71.0 in | Wt 222.0 lb

## 2018-03-08 DIAGNOSIS — R000 Tachycardia, unspecified: Secondary | ICD-10-CM | POA: Diagnosis not present

## 2018-03-08 DIAGNOSIS — E291 Testicular hypofunction: Secondary | ICD-10-CM

## 2018-03-08 DIAGNOSIS — J019 Acute sinusitis, unspecified: Secondary | ICD-10-CM

## 2018-03-08 MED ORDER — AMOXICILLIN-POT CLAVULANATE 875-125 MG PO TABS: 1.0000 | ORAL_TABLET | Freq: Two times a day (BID) | ORAL | 0 refills | Status: AC

## 2018-03-08 NOTE — Progress Notes (Signed)
BP 122/72 (BP Location: Left Arm, Patient Position: Sitting, Cuff Size: Normal)   Pulse (!) 112   Temp 97.9 F (36.6 C) (Oral)   Ht 5\' 11"  (1.803 m)   Wt 222 lb (100.7 kg)   SpO2 98%   BMI 30.96 kg/m    CC: sinusitis? Subjective:    Patient ID: [name], male    DOB: 08-25-1945, 72 y.o.   [name]: [id number]  HPI: [name] is a 72 y.o. male presenting on 03/08/2018 for Sinus Problem (C/o nasal drainage, sore throat and sinus pressure for about 1 wk.  Tried Mucinex, helpful. )   1 wk h/o sinus drainage, ST from drainage, sinus pressure and congestion. Productive cough. Sleeping ok.  Did not take atenolol this morning.   No fevers/chills, ear or tooth pain, abd pain, nausea, headaches, dyspnea or wheeze.  Treating with mucinex and OTC allergy pills.  No sick contacts at home.  Regularly takes tylenol 2 a day. No h/o asthma. Chronic allergic rhinitis on antihistamine for this.   Recent dizziness episodes, saw Dr tower pending MRI on Friday.  Lab Results  Component Value Date   HGBA1C 11.9 (H) 12/29/2017  has f/u with endocrinology planned 05/2017 Cruzita Lederer)  Relevant past medical, surgical, family and social history reviewed and updated as indicated. Interim medical history since our last visit reviewed. Allergies and medications reviewed and updated. Outpatient Medications Prior to Visit  Medication Sig Dispense Refill  . aspirin EC 81 MG tablet Take 81 mg by mouth at bedtime.    [name] Kitchen atenolol (TENORMIN) 50 MG tablet Take 1 tablet (50 mg total) by mouth daily. (Patient taking differently: Take 25 mg by mouth daily. ) 90 tablet 1  . buPROPion (WELLBUTRIN SR) 150 MG 12 hr tablet TAKE 1 TABLET BY MOUTH TWICE A DAY 180 tablet 1  . cholecalciferol (VITAMIN D) 1000 UNITS tablet Take 1,000 Units by mouth daily.      [name] Kitchen donepezil (ARICEPT) 10 MG tablet Take 1 tablet (10 mg total) by mouth at bedtime. 90 tablet 3  . doxazosin (CARDURA) 8 MG tablet Take 0.5 tablets (4 mg total)  by mouth at bedtime. 45 tablet 3  . finasteride (PROSCAR) 5 MG tablet Take 1 tablet (5 mg total) by mouth daily. 30 tablet 2  . fluticasone (FLONASE) 50 MCG/ACT nasal spray INHALE 2 SPRAYS INTO EACH NOSTRIL EVERY DAY AS NEEDED FOR CONGESTION 48 g 3  . gabapentin (NEURONTIN) 300 MG capsule Take 1 capsule (300 mg total) by mouth 2 (two) times daily. 180 capsule 0  . ibuprofen (ADVIL,MOTRIN) 200 MG tablet Take 200-400 mg by mouth every 6 (six) hours as needed for headache (pain).    . insulin NPH Human (HUMULIN N,NOVOLIN N) 100 UNIT/ML injection Inject 0.25 mLs (25 Units total) into the skin 2 (two) times daily before a meal. 10 mL   . insulin regular (NOVOLIN R,HUMULIN R) 100 units/mL injection Inject 0.2 mLs (20 Units total) into the skin 2 (two) times daily before a meal. 10 mL   . lisinopril (PRINIVIL,ZESTRIL) 10 MG tablet Take 1 tablet (10 mg total) by mouth daily. 90 tablet 1  . memantine (NAMENDA) 10 MG tablet Take 1 tablet (10 mg total) by mouth 2 (two) times daily. 90 tablet 3  . nystatin cream (MYCOSTATIN) Apply 1 application topically 2 (two) times daily. 30 g 0  . omeprazole (PRILOSEC) 40 MG capsule Take 1 capsule (40 mg total) by mouth 2 (two) times daily before a meal.  180 capsule 0  . sildenafil (REVATIO) 20 MG tablet Generic Revatio / Sildanefil 20 mg. 2 - 5 tabs 30 mins prior to intercourse. 10 tablet prn  . simvastatin (ZOCOR) 40 MG tablet Take 1 tablet (40 mg total) by mouth at bedtime. 90 tablet 3  . testosterone cypionate (DEPOTESTOSTERONE CYPIONATE) 200 MG/ML injection Inject 150 mg every 14 days 10 mL 1  . triamcinolone cream (KENALOG) 0.1 %     . venlafaxine XR (EFFEXOR-XR) 75 MG 24 hr capsule TAKE ONE CAPSULE (75 MG) BY MOUTH EVERY MORNING AND TWO CAPSULES (150 MG) AT NIGHT 270 capsule 1   Facility-Administered Medications Prior to Visit  Medication Dose Route Frequency Provider Last Rate Last Dose  . testosterone cypionate (DEPOTESTOSTERONE CYPIONATE) injection 150 mg  150  mg Intramuscular Q14 Days Tonia Ghent, MD   150 mg at 03/08/18 1125  . testosterone cypionate (DEPOTESTOSTERONE CYPIONATE) injection 150 mg  150 mg Intramuscular Q14 Days Bedsole, Amy E, MD   150 mg at 02/09/18 1001     Per HPI unless specifically indicated in ROS section below Review of Systems     Objective:    BP 122/72 (BP Location: Left Arm, Patient Position: Sitting, Cuff Size: Normal)   Pulse (!) 112   Temp 97.9 F (36.6 C) (Oral)   Ht 5\' 11"  (1.803 m)   Wt 222 lb (100.7 kg)   SpO2 98%   BMI 30.96 kg/m   Wt Readings from Last 3 Encounters:  03/08/18 222 lb (100.7 kg)  02/23/18 224 lb 12 oz (101.9 kg)  01/16/18 222 lb 8 oz (100.9 kg)    Physical Exam  Constitutional: He appears well-developed and well-nourished. No distress.  HENT:  Head: Normocephalic and atraumatic.  Right Ear: Hearing, tympanic membrane, external ear and ear canal normal.  Left Ear: Hearing, tympanic membrane, external ear and ear canal normal.  Nose: No mucosal edema or rhinorrhea. Right sinus exhibits maxillary sinus tenderness and frontal sinus tenderness. Left sinus exhibits maxillary sinus tenderness and frontal sinus tenderness.  Mouth/Throat: Uvula is midline, oropharynx is clear and moist and mucous membranes are normal. No oropharyngeal exudate, posterior oropharyngeal edema, posterior oropharyngeal erythema or tonsillar abscesses.  Mild sinus tenderness to percussion Oropharynx largely clear  Eyes: Pupils are equal, round, and reactive to light. Conjunctivae and EOM are normal. No scleral icterus.  Neck: Normal range of motion. Neck supple.  Cardiovascular: Regular rhythm, normal heart sounds and intact distal pulses. Tachycardia present.  No murmur heard. Pulmonary/Chest: Effort normal. No respiratory distress. He has no wheezes. He has rhonchi (faint right sided). He has no rales.  Lymphadenopathy:    He has no cervical adenopathy.  Skin: Skin is warm and dry. No rash noted.    Nursing note and vitals reviewed.     Assessment & Plan:   Problem List Items Addressed This Visit    Tachycardia    Anticipate due to missing atenolol this morning. rec take this when he gets home.       Acute sinusitis - Primary    Anticipate viral given short duration, no focal symptoms. Reviewed suppoertive care with patient, red flags to fill WASP abx discussed. Update if not better with above.       Relevant Medications   amoxicillin-clavulanate (AUGMENTIN) 875-125 MG tablet       Meds ordered this encounter  Medications  . amoxicillin-clavulanate (AUGMENTIN) 875-125 MG tablet    Sig: Take 1 tablet by mouth 2 (two) times daily for 10  days.    Dispense:  20 tablet    Refill:  0   No orders of the defined types were placed in this encounter.   Follow up plan: Return if symptoms worsen or fail to improve.  Ria Bush, MD

## 2018-03-08 NOTE — Progress Notes (Signed)
Per orders of Dr. Frederico Hamman Copland, injection of Testosterone given by Lurlean Nanny. Patient tolerated injection well.

## 2018-03-08 NOTE — Assessment & Plan Note (Signed)
Anticipate due to missing atenolol this morning. rec take this when he gets home.

## 2018-03-08 NOTE — Patient Instructions (Signed)
Heart rate is fast today - take atenolol when you get home, continue good water intake to ensure staying well hydrated. You have a sinus infection, likely viral. Push fluids and plenty of rest. Nasal saline irrigation or neti pot to help drain sinuses. Continue allergy pill. May take ibuprofen 400mg  with meals for sinus inflammation. May use plain mucinex with plenty of fluid to help mobilize mucous. If fever >101, worsening productive cough, worsening facial pain, or no improvement past 10 days of illness, fill antibiotic prescription printed out today.

## 2018-03-08 NOTE — Assessment & Plan Note (Signed)
Anticipate viral given short duration, no focal symptoms. Reviewed suppoertive care with patient, red flags to fill WASP abx discussed. Update if not better with above.

## 2018-03-10 ENCOUNTER — Ambulatory Visit (HOSPITAL_COMMUNITY)
Admission: RE | Admit: 2018-03-10 | Discharge: 2018-03-10 | Disposition: A | Discharge status: Home / Self Care | Payer: Medicare Other | Source: Ambulatory Visit | Attending: Family Medicine | Admitting: Family Medicine

## 2018-03-10 DIAGNOSIS — R42 Dizziness and giddiness: Secondary | ICD-10-CM | POA: Diagnosis not present

## 2018-03-10 DIAGNOSIS — G459 Transient cerebral ischemic attack, unspecified: Secondary | ICD-10-CM | POA: Diagnosis not present

## 2018-03-10 DIAGNOSIS — H81399 Other peripheral vertigo, unspecified ear: Secondary | ICD-10-CM | POA: Insufficient documentation

## 2018-03-13 ENCOUNTER — Telehealth: Payer: Self-pay | Admitting: *Deleted

## 2018-03-13 NOTE — Telephone Encounter (Addendum)
Called pt regarding MRI results but no answer and no VM (kept ringing)

## 2018-03-13 NOTE — Addendum Note (Signed)
Addended by: Viviana Simpler I on: 03/13/2018 11:15 AM   Modules accepted: Level of Service

## 2018-03-15 NOTE — Telephone Encounter (Signed)
Sent mychart asking pt to update [place] on how he's feeling

## 2018-03-22 ENCOUNTER — Other Ambulatory Visit: Payer: Self-pay | Admitting: Gastroenterology

## 2018-03-22 ENCOUNTER — Ambulatory Visit (INDEPENDENT_AMBULATORY_CARE_PROVIDER_SITE_OTHER): Payer: Medicare Other | Admitting: *Deleted

## 2018-03-22 DIAGNOSIS — K219 Gastro-esophageal reflux disease without esophagitis: Secondary | ICD-10-CM

## 2018-03-22 DIAGNOSIS — E291 Testicular hypofunction: Secondary | ICD-10-CM | POA: Diagnosis not present

## 2018-03-22 DIAGNOSIS — R1319 Other dysphagia: Secondary | ICD-10-CM

## 2018-03-22 DIAGNOSIS — R1310 Dysphagia, unspecified: Secondary | ICD-10-CM

## 2018-03-22 MED ORDER — TESTOSTERONE CYPIONATE 200 MG/ML IM SOLN
150.0000 mg | INTRAMUSCULAR | Status: DC
  Administered 2018-03-22: 150 mg via INTRAMUSCULAR

## 2018-03-22 NOTE — Progress Notes (Signed)
Per orders of Carlis Abbott, NP*, injection of testosterone given by Modena Nunnery. Patient tolerated injection well.

## 2018-03-24 NOTE — Progress Notes (Signed)
I have actually never seen this patient or ordered his testosterone injection.  No recent update in chart regarding need for continued testosterone.  Will CC this to PCP for further insight.  Will sign off this time as he has already had the injection and has been getting this every 2 weeks in our clinic.

## 2018-03-27 NOTE — Progress Notes (Signed)
Approved testosterone injection.

## 2018-04-04 ENCOUNTER — Ambulatory Visit (INDEPENDENT_AMBULATORY_CARE_PROVIDER_SITE_OTHER): Payer: Medicare Other | Admitting: *Deleted

## 2018-04-04 DIAGNOSIS — E291 Testicular hypofunction: Secondary | ICD-10-CM

## 2018-04-04 MED ORDER — TESTOSTERONE CYPIONATE 200 MG/ML IM SOLN
150.0000 mg | INTRAMUSCULAR | Status: DC
  Administered 2018-04-04: 150 mg via INTRAMUSCULAR

## 2018-04-04 NOTE — Progress Notes (Signed)
Per orders of Dr. Diona Browner, injection of testosterone given by Modena Nunnery. Patient tolerated injection well.

## 2018-04-07 ENCOUNTER — Ambulatory Visit (INDEPENDENT_AMBULATORY_CARE_PROVIDER_SITE_OTHER): Payer: Medicare Other | Admitting: Family Medicine

## 2018-04-07 ENCOUNTER — Encounter: Payer: Self-pay | Admitting: Family Medicine

## 2018-04-07 VITALS — BP 156/78 | HR 120 | Temp 97.8°F | Ht 71.0 in | Wt 221.8 lb

## 2018-04-07 DIAGNOSIS — IMO0002 Reserved for concepts with insufficient information to code with codable children: Secondary | ICD-10-CM

## 2018-04-07 DIAGNOSIS — E1165 Type 2 diabetes mellitus with hyperglycemia: Secondary | ICD-10-CM

## 2018-04-07 DIAGNOSIS — R42 Dizziness and giddiness: Secondary | ICD-10-CM

## 2018-04-07 DIAGNOSIS — R2689 Other abnormalities of gait and mobility: Secondary | ICD-10-CM

## 2018-04-07 DIAGNOSIS — E1142 Type 2 diabetes mellitus with diabetic polyneuropathy: Secondary | ICD-10-CM | POA: Diagnosis not present

## 2018-04-07 DIAGNOSIS — I10 Essential (primary) hypertension: Secondary | ICD-10-CM

## 2018-04-07 NOTE — Patient Instructions (Addendum)
Take your medicines as soon as you get   For diabetes and neuropathy (which both worsen balance and dizziness problems) - please consider eating better ! The better your diabetes is the better you will feel Keep walking for exercise   We will refer you to physical therapy for vestibular/gait training (for balance) and also to prevent falls  See neurology at the end of January

## 2018-04-07 NOTE — Progress Notes (Signed)
Subjective:    Patient ID: [name], male    DOB: Oct 15, 1945, 72 y.o.   [name]: [id number]  HPI 72 yo pt of Dr Lorelei Pont here for dizziness  No change in his dizziness or balance problems since last visit with me    H/o chronic balance problems  Episodic with some dizziness since 2016 Also has neuropathy (diabetic)  Still has dizziness episodes twice weekly  Notes his low back will hurt as well- more lately   Lab Results  Component Value Date   HGBA1C 11.9 (H) 12/29/2017  was on a stronger insulin briefly in 2016 and then went back  Struggling with trying to eat right  Cannot resist cravings at all (does not try)   Walks for exercise- [place] at least 30 minutes    Remote hx of TIA Has seen Dr Krista Blue -neurology   Wt Readings from Last 3 Encounters:  04/07/18 221 lb 12 oz (100.6 kg)  03/08/18 222 lb (100.7 kg)  02/23/18 224 lb 12 oz (101.9 kg)   30.93 kg/m    Did not take bp med today (he forgot)  temormin 25 mg  cardura 4 mg daily  Lisinopril 10 mg  BP Readings from Last 3 Encounters:  04/07/18 (!) 156/78  03/08/18 122/72  02/23/18 (!) 146/76   Pulse Readings from Last 3 Encounters:  04/07/18 (!) 120  03/08/18 (!) 112  02/23/18 86   Last visit here -we ref to neurology  Disc poss of PT for balance   OSA MRI was ordered Mr Brain Wo Contrast  Result Date: 03/10/2018 CLINICAL DATA:  Vertigo EXAM: MRI HEAD WITHOUT CONTRAST TECHNIQUE: Multiplanar, multiecho pulse sequences of the brain and surrounding structures were obtained without intravenous contrast. COMPARISON:  None. FINDINGS: BRAIN: There is no acute infarct, acute hemorrhage, hydrocephalus or extra-axial collection. The midline structures are normal. No midline shift or other mass effect. There are no old infarcts. Multifocal white matter hyperintensity, most commonly due to chronic ischemic microangiopathy. Generalized atrophy without lobar predilection.  Susceptibility-sensitive sequences show no chronic microhemorrhage or superficial siderosis. VASCULAR: Major intracranial arterial and venous sinus flow voids are normal. SKULL AND UPPER CERVICAL SPINE: Calvarial bone marrow signal is normal. There is no skull base mass. Visualized upper cervical spine and soft tissues are normal. SINUSES/ORBITS: No fluid levels or advanced mucosal thickening. No mastoid or middle ear effusion. The orbits are normal. IMPRESSION: Findings of chronic small vessel disease and generalized atrophy without acute intracranial abnormality. Electronically Signed   By: Ulyses Jarred M.D.   On: 03/10/2018 16:23   He has appt with DR Krista Blue on jan 27th   Memory is ok - ([facility])- taking his medications and mvi   Is interested in PT for balance/gait   Patient Active Problem List   Diagnosis Date Noted  . Episode of dizziness 02/23/2018  . Poor balance 02/23/2018  . Falls 10/14/2017  . Esophageal dysphagia   . Mild cognitive disorder 06/29/2016  . Uncontrolled type 2 diabetes mellitus with peripheral neuropathy ([place]) 05/11/2016  . Erectile dysfunction associated with type 2 diabetes mellitus ([place]) 05/28/2015  . Former very heavy cigarette smoker (more than 40 per day) 10/07/2014  . Tachycardia 09/10/2014  . Chronic kidney disease, stage III (moderate) ([place]) 12/19/2012  . Hypogonadism male 06/14/2012  . Obesity (BMI 30-39.9) 09/27/2011  . OSA (obstructive sleep apnea) 03/09/2011  . Diabetic neuropathy ([place]) 12/29/2010  . Family history of MI (myocardial infarction) 09/16/2010  .  HIP REPLACEMENT, RIGHT, HX OF 02/05/2010  . Hypertriglyceridemia 06/13/2008  . GOUT 06/13/2008  . Major depressive disorder, recurrent episode, in partial remission ([place]) 06/13/2008  . Essential hypertension 06/13/2008  . ALLERGIC RHINITIS 06/13/2008  . GERD 06/13/2008  . OSTEOARTHRITIS 06/13/2008   Past Medical History:  Diagnosis Date  . Allergic rhinitis due to pollen   . Chronic kidney  disease, stage III (moderate) ([place]) 12/19/2012  . Depression   . Diabetes mellitus ([place])   . Diverticulosis   . Erectile dysfunction associated with type 2 diabetes mellitus ([place]) 05/28/2015  . Former very heavy cigarette smoker (more than 40 per day) 10/07/2014  . GERD (gastroesophageal reflux disease)   . Gout   . Headache   . Hyperlipidemia   . Hypertension   . Hypogonadism male 06/14/2012  . Iron deficiency   . Low testosterone   . Memory loss   . Osteoarthritis   . Personal history of colonic polyps    1996   Past Surgical History:  Procedure Laterality Date  . CARDIAC CATHETERIZATION  11/2011   [facility]  . CHOLECYSTECTOMY  1980  . ESOPHAGOGASTRODUODENOSCOPY (EGD) WITH PROPOFOL N/A 09/27/2017   Procedure: ESOPHAGOGASTRODUODENOSCOPY (EGD) WITH PROPOFOL;  Surgeon: Lin Landsman, MD;  Location: [facility];  Service: Gastroenterology;  Laterality: N/A;  . EYE SURGERY Right 07/29/2016   cataract - Dr. Manuella Ghazi  . EYE SURGERY Left 08/23/2106   cataract - Dr. Manuella Ghazi  . TOTAL HIP ARTHROPLASTY  2009   Dr. Gladstone Lighter  . TOTAL HIP ARTHROPLASTY     Social History   Tobacco Use  . Smoking status: Former Smoker    Packs/day: 3.00    Years: 35.00    Pack years: 105.00    Types: Cigarettes    Last attempt to quit: 04/12/2000    Years since quitting: 17.9  . Smokeless tobacco: Never Used  Substance Use Topics  . Alcohol use: No  . Drug use: No   Family History  Problem Relation Age of Onset  . Diabetes Mother   . Alzheimer's disease Mother   . Diabetes Father   . Lung cancer Father        Age 23  . Stroke Father   . Heart attack Father   . Lung cancer Sister        lung  . Heart disease Maternal Grandfather   . COPD Sister   . Heart attack Sister   . Heart disease Sister    Allergies  Allergen Reactions  . Tetracycline Itching and Rash   Current Outpatient Medications on File Prior to Visit  Medication Sig Dispense Refill  . aspirin EC 81 MG tablet Take 81 mg by mouth at  bedtime.    [name] Kitchen atenolol (TENORMIN) 50 MG tablet Take 1 tablet (50 mg total) by mouth daily. (Patient taking differently: Take 25 mg by mouth daily. ) 90 tablet 1  . buPROPion (WELLBUTRIN SR) 150 MG 12 hr tablet TAKE 1 TABLET BY MOUTH TWICE A DAY 180 tablet 1  . cholecalciferol (VITAMIN D) 1000 UNITS tablet Take 1,000 Units by mouth daily.      [name] Kitchen donepezil (ARICEPT) 10 MG tablet Take 1 tablet (10 mg total) by mouth at bedtime. 90 tablet 3  . doxazosin (CARDURA) 8 MG tablet Take 0.5 tablets (4 mg total) by mouth at bedtime. 45 tablet 3  . finasteride (PROSCAR) 5 MG tablet Take 1 tablet (5 mg total) by mouth daily. 30 tablet 2  . fluticasone (FLONASE) 50 MCG/ACT nasal  spray INHALE 2 SPRAYS INTO EACH NOSTRIL EVERY DAY AS NEEDED FOR CONGESTION 48 g 3  . gabapentin (NEURONTIN) 300 MG capsule Take 1 capsule (300 mg total) by mouth 2 (two) times daily. 180 capsule 0  . ibuprofen (ADVIL,MOTRIN) 200 MG tablet Take 200-400 mg by mouth every 6 (six) hours as needed for headache (pain).    . insulin NPH Human (HUMULIN N,NOVOLIN N) 100 UNIT/ML injection Inject 0.25 mLs (25 Units total) into the skin 2 (two) times daily before a meal. 10 mL   . insulin regular (NOVOLIN R,HUMULIN R) 100 units/mL injection Inject 0.2 mLs (20 Units total) into the skin 2 (two) times daily before a meal. 10 mL   . lisinopril (PRINIVIL,ZESTRIL) 10 MG tablet Take 1 tablet (10 mg total) by mouth daily. 90 tablet 1  . memantine (NAMENDA) 10 MG tablet Take 1 tablet (10 mg total) by mouth 2 (two) times daily. 90 tablet 3  . nystatin cream (MYCOSTATIN) Apply 1 application topically 2 (two) times daily. 30 g 0  . omeprazole (PRILOSEC) 40 MG capsule Take 1 capsule (40 mg total) by mouth 2 (two) times daily before a meal. 180 capsule 0  . omeprazole (PRILOSEC) 40 MG capsule TAKE 1 CAPSULE (40 MG TOTAL) BY MOUTH 2 (TWO) TIMES DAILY BEFORE A MEAL. 60 capsule 1  . sildenafil (REVATIO) 20 MG tablet Generic Revatio / Sildanefil 20 mg. 2 - 5 tabs 30  mins prior to intercourse. 10 tablet prn  . simvastatin (ZOCOR) 40 MG tablet Take 1 tablet (40 mg total) by mouth at bedtime. 90 tablet 3  . testosterone cypionate (DEPOTESTOSTERONE CYPIONATE) 200 MG/ML injection Inject 150 mg every 14 days 10 mL 1  . triamcinolone cream (KENALOG) 0.1 %     . venlafaxine XR (EFFEXOR-XR) 75 MG 24 hr capsule TAKE ONE CAPSULE (75 MG) BY MOUTH EVERY MORNING AND TWO CAPSULES (150 MG) AT NIGHT 270 capsule 1   Current Facility-Administered Medications on File Prior to Visit  Medication Dose Route Frequency Provider Last Rate Last Dose  . testosterone cypionate (DEPOTESTOSTERONE CYPIONATE) injection 150 mg  150 mg Intramuscular Q14 Days Tonia Ghent, MD   150 mg at 03/08/18 1125  . testosterone cypionate (DEPOTESTOSTERONE CYPIONATE) injection 150 mg  150 mg Intramuscular Q14 Days Bedsole, Amy E, MD   150 mg at 02/09/18 1001  . testosterone cypionate (DEPOTESTOSTERONE CYPIONATE) injection 150 mg  150 mg Intramuscular Q14 Days Pleas Koch, NP   150 mg at 03/22/18 1121  . testosterone cypionate (DEPOTESTOSTERONE CYPIONATE) injection 150 mg  150 mg Intramuscular Q14 Days Bedsole, Amy E, MD   150 mg at 04/04/18 1036     Review of Systems  Constitutional: Positive for fatigue. Negative for activity change, appetite change, fever and unexpected weight change.  HENT: Negative for congestion, rhinorrhea, sore throat and trouble swallowing.   Eyes: Negative for pain, redness, itching and visual disturbance.  Respiratory: Negative for cough, chest tightness, shortness of breath and wheezing.   Cardiovascular: Negative for chest pain and palpitations.  Gastrointestinal: Negative for abdominal pain, blood in stool, constipation, diarrhea and nausea.  Endocrine: Negative for cold intolerance, heat intolerance, polydipsia and polyuria.  Genitourinary: Negative for difficulty urinating, dysuria, frequency and urgency.  Musculoskeletal: Positive for arthralgias. Negative  for joint swelling and myalgias.  Skin: Negative for pallor and rash.  Neurological: Positive for dizziness. Negative for tremors, seizures, syncope, facial asymmetry, speech difficulty, weakness, light-headedness, numbness and headaches.  Hematological: Negative for adenopathy. Does not bruise/bleed  easily.  Psychiatric/Behavioral: Negative for decreased concentration and dysphoric mood. The patient is not nervous/anxious.        Objective:   Physical Exam Constitutional:      General: He is not in acute distress.    Appearance: Normal appearance. He is obese. He is not ill-appearing.  HENT:     Head: Normocephalic and atraumatic.     Right Ear: Tympanic membrane normal.     Left Ear: Tympanic membrane normal.     Mouth/Throat:     Mouth: Mucous membranes are moist.     Pharynx: No posterior oropharyngeal erythema.  Eyes:     Extraocular Movements: Extraocular movements intact.     Conjunctiva/sclera: Conjunctivae normal.     Pupils: Pupils are equal, round, and reactive to light.     Comments: No nystagmus  Neck:     Musculoskeletal: Normal range of motion.     Vascular: No carotid bruit.  Cardiovascular:     Rate and Rhythm: Regular rhythm. Tachycardia present.  Pulmonary:     Effort: Pulmonary effort is normal.     Breath sounds: Normal breath sounds. No rales.  Musculoskeletal:        General: No swelling.  Lymphadenopathy:     Cervical: No cervical adenopathy.  Skin:    General: Skin is warm and dry.     Capillary Refill: Capillary refill takes less than 2 seconds.     Coloration: Skin is not pale.     Findings: No rash.  Neurological:     General: No focal deficit present.     Mental Status: He is alert.     Deep Tendon Reflexes: Reflexes normal.     Comments: Walks with cane Generally poor balance No focal deficits  Psychiatric:        Mood and Affect: Mood normal.           Assessment & Plan:   Problem List Items Addressed This Visit       Cardiovascular and Mediastinum   Essential hypertension (Chronic)    Pt did not take bp medicines so bp and pulse rate are both up  Reminded him to take medication as soon as he gets home today         Endocrine   Uncontrolled type 2 diabetes mellitus with peripheral neuropathy (HCC) (Chronic)    Lab Results  Component Value Date   HGBA1C 11.9 (H) 12/29/2017   Sees Dr Vicie Mutters he has no control over his eating and continues to eat honey buns and other sweets I do not think he will achieve adequate control or feel better until he gets a hold of this He voiced awareness        Other   Poor balance - Primary    Ongoing-no changes since last visit  No doubt neuropathy adds (enc strongly to eat a DM diet)  Pending appt with neuro in a month In meantime-will ref to PT to work on gait       Relevant Orders   Ambulatory referral to Physical Therapy   Episode of dizziness    No changes since last visit-still about twice weekly  No falls Reassuring MRI  Pending neuro f/u in a month

## 2018-04-07 NOTE — Assessment & Plan Note (Signed)
Lab Results  Component Value Date   HGBA1C 11.9 (H) 12/29/2017   Sees Dr Vicie Mutters he has no control over his eating and continues to eat honey buns and other sweets I do not think he will achieve adequate control or feel better until he gets a hold of this He voiced awareness

## 2018-04-07 NOTE — Assessment & Plan Note (Signed)
No changes since last visit-still about twice weekly  No falls Reassuring MRI  Pending neuro f/u in a month

## 2018-04-07 NOTE — Assessment & Plan Note (Signed)
Ongoing-no changes since last visit  No doubt neuropathy adds (enc strongly to eat a DM diet)  Pending appt with neuro in a month In meantime-will ref to PT to work on gait

## 2018-04-07 NOTE — Assessment & Plan Note (Addendum)
Pt did not take bp medicines so bp and pulse rate are both up  Reminded him to take medication as soon as he gets home today

## 2018-04-11 NOTE — Progress Notes (Signed)
Dr. Frederico Hamman T. Beola Vasallo, MD, [facility] [place], [address] Phone: [phone] Fax: [phone]  04/13/2018  Patient: [name], [name]: [id number], DOB: 1945-06-14, 72 y.o.  Primary Physician:  [name], MD   Chief Complaint  Patient presents with  . Follow-up    Dizziness-Seen Dr. Glori Bickers 04/07/18   Subjective:   [name] is a 72 y.o. very pleasant male patient who presents with the following:  Pleasant gentleman with poorly controlled diabetes, hypogonadism, and some balance difficulties as well as recent memory problems who is generally done poorly since his wife's death.  He is not been doing a very good job with taking his medications or eating appropriately for his diabetes.  He overall has not been well controlled for his diabetes in a very long time.  He had been seeing endocrinology, but I have been unable to get him to follow-up with them again in recent times.  My partner recently referred him for an MRI of his brain which was grossly normal with exception of some white matter changes, and he is also going to begin doing some PT for balance.  Depo-test last 04/04/2018.  Very poor diet, ? Regarding compliance.  His primary complaint today is his dizziness.  This is been a recurrent problem off and on.  His MRI was reviewed.  Intermittent dizziness at least for couple times a week.  He is unsure if this occurs with motion with standing up.  He tells me he has been taking his blood pressure medicine least for the last couple of days.  He questions whether this could be related to some of his medication or not.  Carotid dopplers previously were clean.  Past Medical History, Surgical History, Social History, Family History, Problem List, Medications, and Allergies have been reviewed and updated if relevant.  Patient Active Problem List   Diagnosis Date Noted  . Uncontrolled type 2 diabetes  mellitus with peripheral neuropathy ([place]) 05/11/2016    Priority: Medium  . Former very heavy cigarette smoker (more than 40 per day) 10/07/2014    Priority: Medium  . Chronic kidney disease, stage III (moderate) ([place]) 12/19/2012    Priority: Medium  . OSA (obstructive sleep apnea) 03/09/2011    Priority: Medium  . Diabetic neuropathy ([place]) 12/29/2010    Priority: Medium  . Hypertriglyceridemia 06/13/2008    Priority: Medium  . Major depressive disorder, recurrent episode, in partial remission ([place]) 06/13/2008    Priority: Medium  . Essential hypertension 06/13/2008    Priority: Medium  . Episode of dizziness 02/23/2018  . Poor balance 02/23/2018  . Falls 10/14/2017  . Esophageal dysphagia   . Mild cognitive disorder 06/29/2016  . Erectile dysfunction associated with type 2 diabetes mellitus ([place]) 05/28/2015  . Tachycardia 09/10/2014  . Hypogonadism male 06/14/2012  . Obesity (BMI 30-39.9) 09/27/2011  . Family history of MI (myocardial infarction) 09/16/2010  . HIP REPLACEMENT, RIGHT, HX OF 02/05/2010  . GOUT 06/13/2008  . ALLERGIC RHINITIS 06/13/2008  . GERD 06/13/2008  . OSTEOARTHRITIS 06/13/2008    Past Medical History:  Diagnosis Date  . Allergic rhinitis due to pollen   . Chronic kidney disease, stage III (moderate) ([place]) 12/19/2012  . Depression   . Diabetes mellitus ([place])   . Diverticulosis   . Erectile dysfunction associated with type 2 diabetes mellitus ([place]) 05/28/2015  . Former very heavy cigarette smoker (more than 40 per day) 10/07/2014  . GERD (gastroesophageal  reflux disease)   . Gout   . Headache   . Hyperlipidemia   . Hypertension   . Hypogonadism male 06/14/2012  . Iron deficiency   . Low testosterone   . Memory loss   . Osteoarthritis   . Personal history of colonic polyps    1996    Past Surgical History:  Procedure Laterality Date  . CARDIAC CATHETERIZATION  11/2011   [facility]  . CHOLECYSTECTOMY  1980  . ESOPHAGOGASTRODUODENOSCOPY (EGD) WITH  PROPOFOL N/A 09/27/2017   Procedure: ESOPHAGOGASTRODUODENOSCOPY (EGD) WITH PROPOFOL;  Surgeon: Lin Landsman, MD;  Location: [facility];  Service: Gastroenterology;  Laterality: N/A;  . EYE SURGERY Right 07/29/2016   [facility] - Dr. Manuella Ghazi  . EYE SURGERY Left 08/23/2106   [facility] - Dr. Manuella Ghazi  . TOTAL HIP ARTHROPLASTY  2009   Dr. Gladstone Lighter  . TOTAL HIP ARTHROPLASTY      Social History   Socioeconomic History  . Marital status: Single    Spouse name: Not on file  . Number of children: 4  . Years of education: 12th  . Highest education level: Not on file  Occupational History  . Occupation: Counsellor  . 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 Financial resource strain: Not on file  . Food insecurity:    Worry: Not on file    Inability: Not on file  . Transportation needs:    Medical: Not on file    Non-medical: Not on file  Tobacco Use  . Smoking status: Former Smoker    Packs/day: 3.00    Years: 35.00    Pack years: 105.00    Types: Cigarettes    Last attempt to quit: 04/12/2000    Years since quitting: 18.0  . Smokeless tobacco: Never Used  Substance and Sexual Activity  . Alcohol use: No  . Drug use: No  . Sexual activity: Never  Lifestyle  . Physical activity:    Days per week: Not on file    Minutes per session: Not on file  . Stress: Not on file  Relationships  . Social connections:    Talks on phone: Not on file    Gets together: Not on file    Attends religious service: Not on file    Active member of club or organization: Not on file    Attends meetings of clubs or organizations: Not on file    Relationship status: Not on file  . Intimate partner violence:    Fear of current or ex partner: Not on file    Emotionally abused: Not on file    Physically abused: Not on file    Forced sexual activity: Not on file  Other Topics Concern  . Not on file  Social History Narrative   No regular exercise   Caffeine  use: yes, dt coke   Lives at home with his wife and his mother lives with him.   Right-handed.    Family History  Problem Relation Age of Onset  . Diabetes Mother   . Alzheimer's disease Mother   . Diabetes Father   . Lung cancer Father        Age 29  . Stroke Father   . Heart attack Father   . Lung cancer Sister        lung  . Heart disease Maternal Grandfather   . COPD Sister   . Heart attack Sister   . Heart disease Sister  Allergies  Allergen Reactions  . Tetracycline Itching and Rash    Medication list reviewed and updated in full in [facility].   GEN: No acute illnesses, no fevers, chills. GI: No n/v/d, eating normally Pulm: No SOB Interactive and getting along well at home.  Otherwise, ROS is as per the HPI.  Objective:   BP (!) 150/76   Pulse (!) 59   Temp 98.4 F (36.9 C) (Oral)   Ht 5\' 11"  (1.803 m)   Wt 226 lb (102.5 kg)   BMI 31.52 kg/m   GEN: WDWN, NAD, Non-toxic, A & O x 3 HEENT: Atraumatic, Normocephalic. Neck supple. No masses, No LAD. Ears and Nose: No external deformity. CV: RRR, No M/G/R. No JVD. No thrill. No extra heart sounds. PULM: CTA B, no wheezes, crackles, rhonchi. No retractions. No resp. distress. No accessory muscle use. EXTR: No c/c/e NEURO Normal gait.  PSYCH: Normally interactive. Conversant. Not depressed or anxious appearing.  Calm demeanor.   Laboratory and Imaging Data:  Assessment and Plan:   Poor balance - Plan: CBC with Differential/Platelet, Basic metabolic panel, Hemoglobin A1c, Vitamin B12, TSH  Episode of dizziness - Plan: CBC with Differential/Platelet, Basic metabolic panel, Hemoglobin A1c, Vitamin B12, TSH  Uncontrolled type 2 diabetes mellitus with peripheral neuropathy (H[facility]) - Plan: Basic metabolic panel, Hemoglobin A1c  Type 2 diabetes mellitus with diabetic neuropathy, with long-term current use of insulin ([place])  He is already had a reasonable work-up.  He is having some orthostasis, so I  am going to have him be cautious with standing and when he is rising from a seated position.  From his medications, the only one that I think a potentially be doing this could be gabapentin, and I am and have him stop his morning dose.  Diabetes is very poorly controlled and has wildly uncontrolled blood sugars.  He is on insulin, and he has follow-up with endocrinology upcoming.  This certainly could be playing a significant role.  Compliance is been an issue in the past, and he notes that he has a very poor diet right now.  He has generally been struggling since his wife's death.  Recheck basic laboratories all of the above which could be relevant as well as diabetes labs.  He is also is going to be starting physical therapy to work on his balance, and he is seeing neurology this month as well.  Patient Instructions  I want you to decrease your Gabapentin dose - stop taking the morning dose. So now you will only take 1 tablet at night.   I hope that helps with your dizziness.    Follow-up: No follow-ups on file.  Meds ordered this encounter  Medications  . gabapentin (NEURONTIN) 300 MG capsule    Sig: Take 1 capsule (300 mg total) by mouth at bedtime.    Dispense:  180 capsule    Refill:  0  . sildenafil (REVATIO) 20 MG tablet    Sig: Generic Revatio / Sildanefil 20 mg. 2 - 5 tabs 30 mins prior to intercourse.    Dispense:  10 tablet    Refill:  11   Orders Placed This Encounter  Procedures  . CBC with Differential/Platelet  . Basic metabolic panel  . Hemoglobin A1c  . Vitamin B12  . TSH    Signed,  Frederico Hamman T. Xiamara Hulet, MD   Outpatient Encounter Medications as of 04/13/2018  Medication Sig  . aspirin EC 81 MG tablet Take 81 mg by mouth at  bedtime.  [name] Kitchen atenolol (TENORMIN) 50 MG tablet Take 50 mg by mouth daily.  [name] Kitchen buPROPion (WELLBUTRIN SR) 150 MG 12 hr tablet TAKE 1 TABLET BY MOUTH TWICE A DAY  . cholecalciferol (VITAMIN D) 1000 UNITS tablet Take 1,000 Units by mouth  daily.    [name] Kitchen donepezil (ARICEPT) 10 MG tablet Take 1 tablet (10 mg total) by mouth at bedtime.  [name] Kitchen doxazosin (CARDURA) 8 MG tablet Take 0.5 tablets (4 mg total) by mouth at bedtime.  . finasteride (PROSCAR) 5 MG tablet Take 1 tablet (5 mg total) by mouth daily.  . fluticasone (FLONASE) 50 MCG/ACT nasal spray INHALE 2 SPRAYS INTO EACH NOSTRIL EVERY DAY AS NEEDED FOR CONGESTION  . gabapentin (NEURONTIN) 300 MG capsule Take 1 capsule (300 mg total) by mouth at bedtime.  [name] Kitchen ibuprofen (ADVIL,MOTRIN) 200 MG tablet Take 200-400 mg by mouth every 6 (six) hours as needed for headache (pain).  . insulin NPH Human (HUMULIN N,NOVOLIN N) 100 UNIT/ML injection Inject 0.25 mLs (25 Units total) into the skin 2 (two) times daily before a meal.  . insulin regular (NOVOLIN R,HUMULIN R) 100 units/mL injection Inject 0.2 mLs (20 Units total) into the skin 2 (two) times daily before a meal.  . lisinopril (PRINIVIL,ZESTRIL) 10 MG tablet Take 1 tablet (10 mg total) by mouth daily.  . memantine (NAMENDA) 10 MG tablet Take 1 tablet (10 mg total) by mouth 2 (two) times daily.  [name] Kitchen nystatin cream (MYCOSTATIN) Apply 1 application topically 2 (two) times daily.  [name] Kitchen omeprazole (PRILOSEC) 40 MG capsule TAKE 1 CAPSULE (40 MG TOTAL) BY MOUTH 2 (TWO) TIMES DAILY BEFORE A MEAL.  . sildenafil (REVATIO) 20 MG tablet Generic Revatio / Sildanefil 20 mg. 2 - 5 tabs 30 mins prior to intercourse.  . simvastatin (ZOCOR) 40 MG tablet Take 1 tablet (40 mg total) by mouth at bedtime.  [name] Kitchen testosterone cypionate (DEPOTESTOSTERONE CYPIONATE) 200 MG/ML injection Inject 150 mg every 14 days  . triamcinolone cream (KENALOG) 0.1 %   . venlafaxine XR (EFFEXOR-XR) 75 MG 24 hr capsule TAKE ONE CAPSULE (75 MG) BY MOUTH EVERY MORNING AND TWO CAPSULES (150 MG) AT NIGHT  . [DISCONTINUED] gabapentin (NEURONTIN) 300 MG capsule Take 1 capsule (300 mg total) by mouth 2 (two) times daily.  . [DISCONTINUED] omeprazole (PRILOSEC) 40 MG capsule Take 1 capsule (40 mg  total) by mouth 2 (two) times daily before a meal.  . [DISCONTINUED] sildenafil (REVATIO) 20 MG tablet Generic Revatio / Sildanefil 20 mg. 2 - 5 tabs 30 mins prior to intercourse.  . [DISCONTINUED] atenolol (TENORMIN) 50 MG tablet Take 1 tablet (50 mg total) by mouth daily. (Patient taking differently: Take 25 mg by mouth daily. )   Facility-Administered Encounter Medications as of 04/13/2018  Medication  . testosterone cypionate (DEPOTESTOSTERONE CYPIONATE) injection 150 mg  . [DISCONTINUED] testosterone cypionate (DEPOTESTOSTERONE CYPIONATE) injection 150 mg  . [DISCONTINUED] testosterone cypionate (DEPOTESTOSTERONE CYPIONATE) injection 150 mg  . [DISCONTINUED] testosterone cypionate (DEPOTESTOSTERONE CYPIONATE) injection 150 mg

## 2018-04-13 ENCOUNTER — Ambulatory Visit (INDEPENDENT_AMBULATORY_CARE_PROVIDER_SITE_OTHER): Payer: Medicare Other | Admitting: Family Medicine

## 2018-04-13 ENCOUNTER — Encounter: Payer: Self-pay | Admitting: Family Medicine

## 2018-04-13 VITALS — BP 150/76 | HR 59 | Temp 98.4°F | Ht 71.0 in | Wt 226.0 lb

## 2018-04-13 DIAGNOSIS — R2689 Other abnormalities of gait and mobility: Secondary | ICD-10-CM | POA: Diagnosis not present

## 2018-04-13 DIAGNOSIS — E1140 Type 2 diabetes mellitus with diabetic neuropathy, unspecified: Secondary | ICD-10-CM | POA: Diagnosis not present

## 2018-04-13 DIAGNOSIS — R42 Dizziness and giddiness: Secondary | ICD-10-CM

## 2018-04-13 DIAGNOSIS — E1142 Type 2 diabetes mellitus with diabetic polyneuropathy: Secondary | ICD-10-CM | POA: Diagnosis not present

## 2018-04-13 DIAGNOSIS — E1165 Type 2 diabetes mellitus with hyperglycemia: Secondary | ICD-10-CM

## 2018-04-13 DIAGNOSIS — Z794 Long term (current) use of insulin: Secondary | ICD-10-CM

## 2018-04-13 DIAGNOSIS — IMO0002 Reserved for concepts with insufficient information to code with codable children: Secondary | ICD-10-CM

## 2018-04-13 LAB — BASIC METABOLIC PANEL
BUN: 17 mg/dL (ref 6–23)
CO2: 29 mEq/L (ref 19–32)
Calcium: 9.1 mg/dL (ref 8.4–10.5)
Chloride: 95 mEq/L — ABNORMAL LOW (ref 96–112)
Creatinine, Ser: 1.4 mg/dL (ref 0.40–1.50)
GFR: 52.88 mL/min — ABNORMAL LOW (ref >60.00)
Glucose, Bld: 312 mg/dL — ABNORMAL HIGH (ref 70–99)
Potassium: 4.7 mEq/L (ref 3.5–5.1)
Sodium: 133 mEq/L — ABNORMAL LOW (ref 135–145)

## 2018-04-13 LAB — TSH: TSH: 1.21 u[IU]/mL (ref 0.35–4.50)

## 2018-04-13 LAB — CBC WITH DIFFERENTIAL/PLATELET
Basophils Absolute: 0 10*3/uL (ref 0.0–0.1)
Basophils Relative: 0.3 % (ref 0.0–3.0)
Eosinophils Absolute: 0.1 10*3/uL (ref 0.0–0.7)
Eosinophils Relative: 1.2 % (ref 0.0–5.0)
HCT: 43.8 % (ref 39.0–52.0)
Hemoglobin: 14.8 g/dL (ref 13.0–17.0)
Lymphocytes Relative: 18.9 % (ref 12.0–46.0)
Lymphs Abs: 1.3 10*3/uL (ref 0.7–4.0)
MCHC: 33.8 g/dL (ref 30.0–36.0)
MCV: 89.5 fl (ref 78.0–100.0)
Monocytes Absolute: 1 10*3/uL (ref 0.1–1.0)
Monocytes Relative: 14.6 % — ABNORMAL HIGH (ref 3.0–12.0)
Neutro Abs: 4.6 10*3/uL (ref 1.4–7.7)
Neutrophils Relative %: 65 % (ref 43.0–77.0)
PLATELETS: 183 10*3/uL (ref 150.0–400.0)
RBC: 4.9 Mil/uL (ref 4.22–5.81)
RDW: 13.5 % (ref 11.5–15.5)
WBC: 7.1 10*3/uL (ref 4.0–10.5)

## 2018-04-13 LAB — HEMOGLOBIN A1C: HEMOGLOBIN A1C: 12.5 % — AB (ref 4.6–6.5)

## 2018-04-13 LAB — VITAMIN B12: VITAMIN B 12: 164 pg/mL — AB (ref 211–911)

## 2018-04-13 MED ORDER — SILDENAFIL CITRATE 20 MG PO TABS: ORAL_TABLET | ORAL | 11 refills | Status: DC

## 2018-04-13 MED ORDER — GABAPENTIN 300 MG PO CAPS: 300.0000 mg | ORAL_CAPSULE | Freq: Every day | ORAL | 0 refills | Status: DC

## 2018-04-13 NOTE — Patient Instructions (Signed)
I want you to decrease your Gabapentin dose - stop taking the morning dose. So now you will only take 1 tablet at night.   I hope that helps with your dizziness.

## 2018-04-14 DIAGNOSIS — R262 Difficulty in walking, not elsewhere classified: Secondary | ICD-10-CM | POA: Diagnosis not present

## 2018-04-14 DIAGNOSIS — R2689 Other abnormalities of gait and mobility: Secondary | ICD-10-CM | POA: Diagnosis not present

## 2018-04-19 ENCOUNTER — Ambulatory Visit (INDEPENDENT_AMBULATORY_CARE_PROVIDER_SITE_OTHER): Payer: Medicare Other | Admitting: *Deleted

## 2018-04-19 DIAGNOSIS — R2689 Other abnormalities of gait and mobility: Secondary | ICD-10-CM | POA: Diagnosis not present

## 2018-04-19 DIAGNOSIS — E291 Testicular hypofunction: Secondary | ICD-10-CM

## 2018-04-19 DIAGNOSIS — R262 Difficulty in walking, not elsewhere classified: Secondary | ICD-10-CM | POA: Diagnosis not present

## 2018-04-19 MED ORDER — TESTOSTERONE CYPIONATE 200 MG/ML IM SOLN
150.0000 mg | INTRAMUSCULAR | Status: DC
  Administered 2018-04-19: 150 mg via INTRAMUSCULAR

## 2018-04-19 NOTE — Progress Notes (Signed)
Per orders of Dr. Lorelei Pont, injection of testosterone given by Modena Nunnery. Patient tolerated injection well.

## 2018-04-24 DIAGNOSIS — R2689 Other abnormalities of gait and mobility: Secondary | ICD-10-CM | POA: Diagnosis not present

## 2018-04-24 DIAGNOSIS — R262 Difficulty in walking, not elsewhere classified: Secondary | ICD-10-CM | POA: Diagnosis not present

## 2018-04-28 ENCOUNTER — Encounter: Payer: Self-pay | Admitting: Internal Medicine

## 2018-04-28 ENCOUNTER — Ambulatory Visit: Payer: Medicare Other | Admitting: Internal Medicine

## 2018-04-28 VITALS — BP 110/60 | HR 85 | Ht 71.0 in | Wt 224.0 lb

## 2018-04-28 DIAGNOSIS — E1165 Type 2 diabetes mellitus with hyperglycemia: Secondary | ICD-10-CM

## 2018-04-28 DIAGNOSIS — E1142 Type 2 diabetes mellitus with diabetic polyneuropathy: Secondary | ICD-10-CM

## 2018-04-28 DIAGNOSIS — E781 Pure hyperglyceridemia: Secondary | ICD-10-CM | POA: Diagnosis not present

## 2018-04-28 DIAGNOSIS — IMO0002 Reserved for concepts with insufficient information to code with codable children: Secondary | ICD-10-CM

## 2018-04-28 DIAGNOSIS — E669 Obesity, unspecified: Secondary | ICD-10-CM | POA: Diagnosis not present

## 2018-04-28 MED ORDER — SEMAGLUTIDE(0.25 OR 0.5MG/DOS) 2 MG/1.5ML ~~LOC~~ SOPN: 0.5000 mg | PEN_INJECTOR | SUBCUTANEOUS | 5 refills | Status: DC

## 2018-04-28 NOTE — Patient Instructions (Addendum)
Please increase: Type of: insulin Breakfast Lunch Dinner  Humulin R 40  40  Humulin N 40  45   Please start Ozempic 0.25 mg weekly in a.m. (for example on Sunday morning) x 4 weeks, then increase to 0.5 mg weekly in a.m. if no nausea or hypoglycemia.  Please return in 3 months with your sugar log.

## 2018-04-28 NOTE — Progress Notes (Signed)
Patient ID: [name], male   DOB: 1945/10/26, 73 y.o.   [name]: [id number]  HPI: [name] is a 73 y.o.-year-old male, returning for f/u for DM2, dx 1997, insulin-dependent since 2010, uncontrolled, with complications (CKD stage 3, PN, ED, TIA). Last visit 1 year and 5 months ago.  His wife died Apr 28, 2017 - PE.  Last hemoglobin A1c was: Lab Results  Component Value Date   HGBA1C 12.5 (H) 04/13/2018   HGBA1C 11.9 (H) 12/29/2017   HGBA1C 12.5 (A) 10/14/2017   He was on:  U500 insulin: - draw up to 20 units on the syringe before b'fast (100 units) - draw up to 10 (not 12) units on the syringe before lunch (60 units) - draw up to 20 (sometimes 25) units on the syringe before dinner (100 units)  He is now on Type of: insulin Breakfast Lunch Dinner  Humulin R 35 >> 40  35 >> 40  Humulin N 35 >> 40  35 >> 40  He is still missing many insulin doses.  Pt checks his sugars 2x a week - he cannot check more often 2/2 pain in fingers from neuropathy: - am: 80, 165-394 >> 72, 177-400 >> 300s - 2h after b'fast: n/c  - before lunch: n/c >> 80, 114-289 >> 80s-460 - 2h after lunch: n/c >> 300s >> n/c - before dinner: 364 >> 198, 335 >> n/c - 2h after dinner: n/c - bedtime: 88-387, 416 >> 80, 137-399 >> 200-300s - nighttime: 60s >> n/c >> 286 >> n/c Lowest sugar was 80 >> 72 >> 80s.  he has hypoglycemia awareness at 100. Highest sugar was 416 >> 400 >> 460.  Pt's meals are: - Breakfast: 3 pancakes with diet syrup - Lunch: tuna or cheese sandwich + chips - Dinner: veggies + starch - Snacks: 4-5 x a day  -+ CKD, last BUN/creatinine:  Lab Results  Component Value Date   BUN 17 04/13/2018   CREATININE 1.40 04/13/2018  Last ACR 4 on 12/12/2012.  On lisinopril 20. -+ HL; last set of lipids: Lab Results  Component Value Date   CHOL 125 12/29/2017   HDL 33.10 (L) 12/29/2017   LDLCALC 43 04/24/2009   LDLDIRECT 103.0 12/29/2017   TRIG 345.0 (H) 12/29/2017   CHOLHDL 4 12/29/2017   On Zocor 40. - last eye exam 06/2016: No DR- [name].  He has a history of cataract surgeries. - + numbness and tingling in his feet.  On Wellbutrin + Namenda + Aricept for Alzheimer dementia.  Of note, he had a very low vitamin B12, under 200, at last check by PCP. He was started on B12 po.  ROS: Constitutional: no weight gain/+ weight loss, no fatigue, no subjective hyperthermia, no subjective hypothermia Eyes: no blurry vision, no xerophthalmia ENT: no sore throat, no nodules palpated in neck, no dysphagia, no odynophagia, no hoarseness Cardiovascular: no CP/no SOB/no palpitations/no leg swelling Respiratory: no cough/no SOB/no wheezing Gastrointestinal: no N/no V/no D/no C/no acid reflux Musculoskeletal: no muscle aches/no joint aches Skin: no rashes, no hair loss Neurological: no tremors/+ numbness/+ tingling/no dizziness + pbs with erections.  I reviewed pt's medications, allergies, PMH, social hx, family hx, and changes were documented in the history of present illness. Otherwise, unchanged from my initial visit note. He decreased Atenolol and Lisinopril dose.  Past Medical History:  Diagnosis Date  . Allergic rhinitis due to pollen   . Chronic kidney disease, stage III (moderate) ([name]  . Depression   .  Diabetes mellitus ([place])   . Diverticulosis   . Erectile dysfunction associated with type 2 diabetes mellitus ([place]) 05/28/2015  . Former very heavy cigarette smoker (more than 40 per day) 10/07/2014  . GERD (gastroesophageal reflux disease)   . Gout   . Headache   . Hyperlipidemia   . Hypertension   . Hypogonadism male 06/14/2012  . Iron deficiency   . Low testosterone   . Memory loss   . Osteoarthritis   . Personal history of colonic polyps    1996   Past Surgical History:  Procedure Laterality Date  . CARDIAC CATHETERIZATION  11/2011   [facility]  . CHOLECYSTECTOMY  1980  . ESOPHAGOGASTRODUODENOSCOPY (EGD) WITH PROPOFOL N/A 09/27/2017   Procedure:  ESOPHAGOGASTRODUODENOSCOPY (EGD) WITH PROPOFOL;  Surgeon: Dustin Landsman, MD;  Location: [place];  Service: Gastroenterology;  Laterality: N/A;  . EYE SURGERY Right 07/29/2016   cataract - Dr. Manuella [name]  . EYE SURGERY Left 08/23/2106   cataract - Dr. Manuella [name]  . TOTAL HIP ARTHROPLASTY  2009   Dr. Gladstone [name]  . TOTAL HIP ARTHROPLASTY     Social History   Socioeconomic History  . Marital status: Single    Spouse name: Not on file  . Number of children: 4  . Years of education: 12th  . Highest education level: Not on file  Occupational History  . Occupation: Counsellor  . 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 Financial resource strain: Not on file  . Food insecurity:    Worry: Not on file    Inability: Not on file  . Transportation needs:    Medical: Not on file    Non-medical: Not on file  Tobacco Use  . Smoking status: Former Smoker    Packs/day: 3.00    Years: 35.00    Pack years: 105.00    Types: Cigarettes    Last attempt to quit: 04/12/2000    Years since quitting: 18.0  . Smokeless tobacco: Never Used  Substance and Sexual Activity  . Alcohol use: No  . Drug use: No  . Sexual activity: Never  Lifestyle  . Physical activity:    Days per week: Not on file    Minutes per session: Not on file  . Stress: Not on file  Relationships  . Social connections:    Talks on phone: Not on file    Gets together: Not on file    Attends religious service: Not on file    Active member of club or organization: Not on file    Attends meetings of clubs or organizations: Not on file    Relationship status: Not on file  . Intimate partner violence:    Fear of current or ex partner: Not on file    Emotionally abused: Not on file    Physically abused: Not on file    Forced sexual activity: Not on file  Other Topics Concern  . Not on file  Social History Narrative   No regular exercise   Caffeine use: yes, dt coke   Lives at home with  his mother lives with him.   Wife [name] recently deceased   Right-handed.   Current Outpatient Medications on File Prior to Visit  Medication Sig Dispense Refill  . aspirin EC 81 MG tablet Take 81 mg by mouth at bedtime.    [name] Kitchen atenolol (TENORMIN) 50 MG tablet Take 50 mg by mouth daily.    [name] Kitchen buPROPion [facility] SR)  150 MG 12 hr tablet TAKE 1 TABLET BY MOUTH TWICE A DAY 180 tablet 1  . cholecalciferol (VITAMIN D) 1000 UNITS tablet Take 1,000 Units by mouth daily.      [name] Kitchen donepezil (ARICEPT) 10 MG tablet Take 1 tablet (10 mg total) by mouth at bedtime. 90 tablet 3  . doxazosin (CARDURA) 8 MG tablet Take 0.5 tablets (4 mg total) by mouth at bedtime. 45 tablet 3  . finasteride (PROSCAR) 5 MG tablet Take 1 tablet (5 mg total) by mouth daily. 30 tablet 2  . fluticasone (FLONASE) 50 MCG/ACT nasal spray INHALE 2 SPRAYS INTO EACH NOSTRIL EVERY DAY AS NEEDED FOR CONGESTION 48 g 3  . gabapentin (NEURONTIN) 300 MG capsule Take 1 capsule (300 mg total) by mouth at bedtime. 180 capsule 0  . ibuprofen (ADVIL,MOTRIN) 200 MG tablet Take 200-400 mg by mouth every 6 (six) hours as needed for headache (pain).    . insulin NPH Human (HUMULIN N,NOVOLIN N) 100 UNIT/ML injection Inject 0.25 mLs (25 Units total) into the skin 2 (two) times daily before a meal. 10 mL   . insulin regular (NOVOLIN R,HUMULIN R) 100 units/mL injection Inject 0.2 mLs (20 Units total) into the skin 2 (two) times daily before a meal. 10 mL   . lisinopril (PRINIVIL,ZESTRIL) 10 MG tablet Take 1 tablet (10 mg total) by mouth daily. 90 tablet 1  . memantine (NAMENDA) 10 MG tablet Take 1 tablet (10 mg total) by mouth 2 (two) times daily. 90 tablet 3  . nystatin cream (MYCOSTATIN) Apply 1 application topically 2 (two) times daily. 30 g 0  . omeprazole (PRILOSEC) 40 MG capsule TAKE 1 CAPSULE (40 MG TOTAL) BY MOUTH 2 (TWO) TIMES DAILY BEFORE A MEAL. 60 capsule 1  . sildenafil (REVATIO) 20 MG tablet Generic Revatio / Sildanefil 20 mg. 2 - 5 tabs 30 mins  prior to intercourse. 10 tablet 11  . simvastatin (ZOCOR) 40 MG tablet Take 1 tablet (40 mg total) by mouth at bedtime. 90 tablet 3  . testosterone cypionate (DEPOTESTOSTERONE CYPIONATE) 200 MG/ML injection Inject 150 mg every 14 days 10 mL 1  . triamcinolone cream (KENALOG) 0.1 %     . venlafaxine XR (EFFEXOR-XR) 75 MG 24 hr capsule TAKE ONE CAPSULE (75 MG) BY MOUTH EVERY MORNING AND TWO CAPSULES (150 MG) AT NIGHT 270 capsule 1   Current Facility-Administered Medications on File Prior to Visit  Medication Dose Route Frequency Provider Last Rate Last Dose  . testosterone cypionate (DEPOTESTOSTERONE CYPIONATE) injection 150 mg  150 mg Intramuscular Q14 Days Tonia Ghent, MD   150 mg at 03/08/18 1125  . testosterone cypionate (DEPOTESTOSTERONE CYPIONATE) injection 150 mg  150 mg Intramuscular Q14 Days Copland, Spencer, MD   150 mg at 04/19/18 1128   Allergies  Allergen Reactions  . Tetracycline Itching and Rash   Family History  Problem Relation Age of Onset  . Diabetes Mother   . Alzheimer's disease Mother   . Diabetes Father   . Lung cancer Father        Age 58  . Stroke Father   . Heart attack Father   . Lung cancer Sister        lung  . Heart disease Maternal Grandfather   . COPD Sister   . Heart attack Sister   . Heart disease Sister     PE: BP 110/60   Pulse 85   Ht 5\' 11"  (1.803 m)   Wt 224 lb (101.6 kg)  SpO2 98%   BMI 31.24 kg/m  Body mass index is 31.24 kg/m. Wt Readings from Last 3 Encounters:  04/28/18 224 lb (101.6 kg)  04/13/18 226 lb (102.5 kg)  04/07/18 221 lb 12 oz (100.6 kg)   Constitutional: overweight, in NAD Eyes: PERRLA, EOMI, no exophthalmos ENT: moist mucous membranes, no thyromegaly, no cervical lymphadenopathy Cardiovascular: RRR, No MRG Respiratory: CTA B Gastrointestinal: abdomen soft, NT, ND, BS+ Musculoskeletal: no deformities, strength intact in all 4 Skin: moist, warm, no rashes Neurological: no tremor with outstretched hands,  DTR normal in all 4   ASSESSMENT: 1. DM2, insulin-dependent, uncontrolled, with complications - noncompliance with visits, insulin doses, CBG checks - CKD stage 3 - PN - ED - TIA  2. HTG  3.  Obesity  PLAN:  1. Patient with history of longstanding, uncontrolled diabetes, with very poor control, noncompliant with medication doses and visits.  He returns after 1 year and 5 months absence.  In the meantime, his wife died July 29, 2017. -He is still missing many medication doses and has a poor diet.  Latest HbA1c was very high at 12.5%. -We discussed that it is very important to not miss insulin doses and I advised him to write down whether he took the insulin or not on his log.  He needs to bring his log at next visit.  He tells me that it is painful for him to check his sugars due to neuropathy.  I gave him information about how to get in touch with the suppliers to obtain a freestyle libre CGM. -At this visit, I will increase the dinnertime NPH, but will also try to add Ozempic.  Discussed about benefits and possible side effects.  We will start at a low dose and increase as tolerated. - I advised him to: Patient Instructions   Please increase: Type of: insulin Breakfast Lunch Dinner  Humulin R 40  40  Humulin N 40  45   Please start Ozempic 0.25 mg weekly in a.m. (for example on Sunday morning) x 4 weeks, then increase to 0.5 mg weekly in a.m. if no nausea or hypoglycemia.  Please return in 3 months with your sugar log.   - continue checking sugars at different times of the day - check 3x a day, rotating checks - advised for yearly eye exams >> he is not UTD - Return to clinic in 3 mo with sugar log   2. HTG -His triglycerides remain high -He continues on statin without side effects -We again discussed about cutting down fatty and sugary snacks  3.  Obesity - lost weight after the death of his wife >> now gained few back after the Holidays -Discussed about improving  diet -Ozempic should also help  Philemon Kingdom, MD PhD [facility]

## 2018-05-04 ENCOUNTER — Ambulatory Visit (INDEPENDENT_AMBULATORY_CARE_PROVIDER_SITE_OTHER): Payer: Medicare Other

## 2018-05-04 DIAGNOSIS — E291 Testicular hypofunction: Secondary | ICD-10-CM

## 2018-05-04 MED ORDER — TESTOSTERONE CYPIONATE 200 MG/ML IM SOLN
150.0000 mg | Freq: Once | INTRAMUSCULAR | Status: AC
  Administered 2018-05-04: 150 mg via INTRAMUSCULAR

## 2018-05-04 NOTE — Progress Notes (Signed)
Per orders of Dr. Damita Dunnings, injection of testosterone given by Brenton Grills. Patient tolerated injection well.

## 2018-05-05 DIAGNOSIS — R2689 Other abnormalities of gait and mobility: Secondary | ICD-10-CM | POA: Diagnosis not present

## 2018-05-05 DIAGNOSIS — R262 Difficulty in walking, not elsewhere classified: Secondary | ICD-10-CM | POA: Diagnosis not present

## 2018-05-08 ENCOUNTER — Encounter: Payer: Self-pay | Admitting: Neurology

## 2018-05-08 ENCOUNTER — Ambulatory Visit: Payer: Medicare Other | Admitting: Neurology

## 2018-05-08 VITALS — BP 135/82 | HR 93 | Ht 71.0 in | Wt 220.0 lb

## 2018-05-08 DIAGNOSIS — G3184 Mild cognitive impairment, so stated: Secondary | ICD-10-CM

## 2018-05-08 DIAGNOSIS — R404 Transient alteration of awareness: Secondary | ICD-10-CM | POA: Diagnosis not present

## 2018-05-08 DIAGNOSIS — E0842 Diabetes mellitus due to underlying condition with diabetic polyneuropathy: Secondary | ICD-10-CM | POA: Diagnosis not present

## 2018-05-08 MED ORDER — MEMANTINE HCL 10 MG PO TABS: 10.0000 mg | ORAL_TABLET | Freq: Two times a day (BID) | ORAL | 4 refills | Status: DC

## 2018-05-08 MED ORDER — DONEPEZIL HCL 10 MG PO TABS: 10.0000 mg | ORAL_TABLET | Freq: Every day | ORAL | 4 refills | Status: DC

## 2018-05-08 NOTE — Progress Notes (Signed)
PATIENT: [name] DOB: 1945/04/21  Chief Complaint  Patient presents with  . Memory Loss    MMSE 28/30 - 13 animals.  Last seen 10/2016.  Feels his memory has remained stable.  . Peripheral Neuropathy    He is having more burning, numbness, pain in his feet and hands.  He is taking gabapentin 300mg , two capsules at bedtime.  . Dizziness    He was having intermittent dizzy spells for about one month, with no warning, that was causing falls.  He started physical therapy for vestibular rehab and his symptoms have now resolved.       HISTORICAL  [name] is a 73 years old right-handed male, seen in refer by his primary care doctor Frederico Hamman Copland for evaluation of memory loss, initial evaluation was on June 29 2016.  He had a history of hypertension, hyperlipidemia, type 2 diabetes, insulin-dependent since 2010, depression, recently Wellbutrin was added on in January for better control of his depression.  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  His mother suffered Alzheimer's disease since age 45, now lives with him, is 73 years old,  He noted to have gradual onset memory trouble around 2015, while driving, he sometimes make wrong exit, tends to repeat questions, difficulty with multitasking.  He has obstructive sleep apnea, supposed to use CPAP machine but not complying sometimes, complains of difficulty sleeping, fatigue during daytime, he baby his 37-year-old grand child, but does not exercise regularly.  I have personally reviewed MRI brain in August 2016, MRI was ordered for acute onset of Aphasia which last for 1 day, mild generalized atrophy especially at perisylvian fissure, no acute abnormality, no stroke noticed.  Review of the laboratory evaluation in 2018, elevated glucose 346, creatinine 1.5 4, LDL 46, normal CBC, hemoglobin of 14 point 6, A1c was elevated 9.7, it was up to 12 point 2  in 2016, negative hepatitis C panel,  Update October 28 2016: I reviewed laboratory evaluations, A1c 10.3, in May 2018, normal CBC, CMP showed mild elevated glucose 123, creatinine 1.69, normal vitamin B12 498, normal TSH,  We have personally reviewed CT head without contrast in June 2018, no acute abnormality, mild to moderate generalized atrophy, small vessel disease,  He is taking and tolerating Namenda 10 mg twice a day Aricept 10 mg daily, no significant improvement noticed,  UPDATE May 08 2018: He has diabetes, diabetic peripheral neuropathy, his diabetes under poorly controlled, will call Cipro 500 mg was recent medication change, since 2016, he had intermittent episode of dizziness, it happened with sudden positional change, such as getting up quickly from seated position, oftentimes he has mild lightheadedness, unsteadiness, but no passing out, in early December 2019, around lunchtime, after finishing grocery shopping, driving back home, stepping out of the car, he had transient loss of consciousness, hurt his right shoulder, his symptoms has since improved,  I personally reviewed MRI of the brain in November 2019, in comparison to previous scan in 2016, no significant change, continued evidence of mild to moderate generalized atrophy, more at perisylvian area,  He is tolerating Aricept 10 mg, Namenda 10 mg twice a day, continue to drive without much difficulty, is the caregiver of his 2 years old mom, who suffered dementia  REVIEW OF SYSTEMS: Full 14 system review of systems performed and notable only for memory loss, walking difficulty, wound, back pain, dizziness, restless leg, daytime  sleepiness, ringing ears  ALLERGIES: Allergies  Allergen Reactions  . Tetracycline Itching and Rash    HOME MEDICATIONS: Current Outpatient Medications  Medication Sig Dispense Refill  . aspirin EC 81 MG tablet Take 81 mg by mouth at bedtime.    [name] Kitchen atenolol (TENORMIN) 50 MG tablet Take 50 mg by  mouth daily.    [name] Kitchen buPROPion (WELLBUTRIN SR) 150 MG 12 hr tablet TAKE 1 TABLET BY MOUTH TWICE A DAY 180 tablet 1  . cholecalciferol (VITAMIN D) 1000 UNITS tablet Take 1,000 Units by mouth daily.      [name] Kitchen donepezil (ARICEPT) 10 MG tablet Take 1 tablet (10 mg total) by mouth at bedtime. 90 tablet 3  . doxazosin (CARDURA) 8 MG tablet Take 0.5 tablets (4 mg total) by mouth at bedtime. 45 tablet 3  . finasteride (PROSCAR) 5 MG tablet Take 1 tablet (5 mg total) by mouth daily. 30 tablet 2  . fluticasone (FLONASE) 50 MCG/ACT nasal spray INHALE 2 SPRAYS INTO EACH NOSTRIL EVERY DAY AS NEEDED FOR CONGESTION 48 g 3  . gabapentin (NEURONTIN) 300 MG capsule Take 1 capsule (300 mg total) by mouth at bedtime. 180 capsule 0  . ibuprofen (ADVIL,MOTRIN) 200 MG tablet Take 200-400 mg by mouth every 6 (six) hours as needed for headache (pain).    . insulin NPH Human (HUMULIN N,NOVOLIN N) 100 UNIT/ML injection Inject 0.25 mLs (25 Units total) into the skin 2 (two) times daily before a meal. 10 mL   . insulin regular (NOVOLIN R,HUMULIN R) 100 units/mL injection Inject 0.2 mLs (20 Units total) into the skin 2 (two) times daily before a meal. 10 mL   . lisinopril (PRINIVIL,ZESTRIL) 10 MG tablet Take 1 tablet (10 mg total) by mouth daily. 90 tablet 1  . memantine (NAMENDA) 10 MG tablet Take 1 tablet (10 mg total) by mouth 2 (two) times daily. 90 tablet 3  . nystatin cream (MYCOSTATIN) Apply 1 application topically 2 (two) times daily. 30 g 0  . omeprazole (PRILOSEC) 40 MG capsule TAKE 1 CAPSULE (40 MG TOTAL) BY MOUTH 2 (TWO) TIMES DAILY BEFORE A MEAL. 60 capsule 1  . Semaglutide,0.25 or 0.5MG /DOS, (OZEMPIC, 0.25 OR 0.5 MG/DOSE,) 2 MG/1.5ML SOPN Inject 0.5 mg into the skin once a week. 2 pen 5  . sildenafil (REVATIO) 20 MG tablet Generic Revatio / Sildanefil 20 mg. 2 - 5 tabs 30 mins prior to intercourse. 10 tablet 11  . simvastatin (ZOCOR) 40 MG tablet Take 1 tablet (40 mg total) by mouth at bedtime. 90 tablet 3  .  testosterone cypionate (DEPOTESTOSTERONE CYPIONATE) 200 MG/ML injection Inject 150 mg every 14 days 10 mL 1  . triamcinolone cream (KENALOG) 0.1 %     . venlafaxine XR (EFFEXOR-XR) 75 MG 24 hr capsule TAKE ONE CAPSULE (75 MG) BY MOUTH EVERY MORNING AND TWO CAPSULES (150 MG) AT NIGHT 270 capsule 1   Current Facility-Administered Medications  Medication Dose Route Frequency Provider Last Rate Last Dose  . testosterone cypionate (DEPOTESTOSTERONE CYPIONATE) injection 150 mg  150 mg Intramuscular Q14 Days Copland, Spencer, MD   150 mg at 04/19/18 1128    PAST MEDICAL HISTORY: Past Medical History:  Diagnosis Date  . Allergic rhinitis due to pollen   . Chronic kidney disease, stage III (moderate) ([place]) 12/19/2012  . Depression   . Diabetes mellitus ([place])   . Diverticulosis   . Erectile dysfunction associated with type 2 diabetes mellitus ([place]) 05/28/2015  . Former very heavy cigarette smoker (more than 40 per  day) 10/07/2014  . GERD (gastroesophageal reflux disease)   . Gout   . Headache   . Hyperlipidemia   . Hypertension   . Hypogonadism male 06/14/2012  . Iron deficiency   . Low testosterone   . Memory loss   . Osteoarthritis   . Personal history of colonic polyps    1996    PAST SURGICAL HISTORY: Past Surgical History:  Procedure Laterality Date  . CARDIAC CATHETERIZATION  11/2011   [facility]  . CHOLECYSTECTOMY  1980  . ESOPHAGOGASTRODUODENOSCOPY (EGD) WITH PROPOFOL N/A 09/27/2017   Procedure: ESOPHAGOGASTRODUODENOSCOPY (EGD) WITH PROPOFOL;  Surgeon: Lin Landsman, MD;  Location: [place];  Service: Gastroenterology;  Laterality: N/A;  . EYE SURGERY Right 07/29/2016   cataract - Dr. Manuella Ghazi  . EYE SURGERY Left 08/23/2106   cataract - Dr. Manuella Ghazi  . TOTAL HIP ARTHROPLASTY  2009   Dr. Gladstone Lighter  . TOTAL HIP ARTHROPLASTY      FAMILY HISTORY: Family History  Problem Relation Age of Onset  . Diabetes Mother   . Alzheimer's disease Mother   . Diabetes Father   . Lung cancer  Father        Age 60  . Stroke Father   . Heart attack Father   . Lung cancer Sister        lung  . Heart disease Maternal Grandfather   . COPD Sister   . Heart attack Sister   . Heart disease Sister     SOCIAL HISTORY:  Social History   Socioeconomic History  . Marital status: Widowed    Spouse name: Not on file  . Number of children: 4  . Years of education: 12th  . Highest education level: Not on file  Occupational History  . Occupation: Counsellor  . 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 Financial resource strain: Not on file  . Food insecurity:    Worry: Not on file    Inability: Not on file  . Transportation needs:    Medical: Not on file    Non-medical: Not on file  Tobacco Use  . Smoking status: Former Smoker    Packs/day: 3.00    Years: 35.00    Pack years: 105.00    Types: Cigarettes    Last attempt to quit: 04/12/2000    Years since quitting: 18.0  . Smokeless tobacco: Never Used  Substance and Sexual Activity  . Alcohol use: No  . Drug use: No  . Sexual activity: Never  Lifestyle  . Physical activity:    Days per week: Not on file    Minutes per session: Not on file  . Stress: Not on file  Relationships  . Social connections:    Talks on phone: Not on file    Gets together: Not on file    Attends religious service: Not on file    Active member of club or organization: Not on file    Attends meetings of clubs or organizations: Not on file    Relationship status: Not on file  . Intimate partner violence:    Fear of current or ex partner: Not on file    Emotionally abused: Not on file    Physically abused: Not on file    Forced sexual activity: Not on file  Other Topics Concern  . Not on file  Social History Narrative   No regular exercise   Caffeine use: yes, dt coke   Lives at  home with his mother lives with him.   Wife Fraser Din recently deceased   Right-handed.     PHYSICAL EXAM   Vitals:    05/08/18 1320  BP: 135/82  Pulse: 93  Weight: 220 lb (99.8 kg)  Height: 5\' 11"  (1.803 m)    Not recorded      Body mass index is 30.68 kg/m.  PHYSICAL EXAMNIATION:  Gen: NAD, conversant, well nourised, obese, well groomed                     Cardiovascular: Regular rate rhythm, no peripheral edema, warm, nontender. Eyes: Conjunctivae clear without exudates or hemorrhage Neck: Supple, no carotid bruits. Pulmonary: Clear to auscultation bilaterally   NEUROLOGICAL EXAM:  MENTAL STATUS:  MMSE - Mini Mental State Exam 05/08/2018 12/26/2017 12/09/2016  Orientation to time 5 5 5   Orientation to Place 5 5 5   Registration 3 3 3   Attention/ Calculation 3 0 0  Recall 3 3 3   Language- name 2 objects 2 0 0  Language- repeat 1 1 1   Language- follow 3 step command 3 3 3   Language- read & follow direction 1 0 0  Write a sentence 1 0 0  Copy design 1 0 0  Total score 28 20 20    Animal naming 13  CRANIAL NERVES: CN II: Visual fields are full to confrontation. Pupils are round equal and briskly reactive to light. CN III, IV, VI: extraocular movement are normal. No ptosis. CN V: Facial sensation is intact to pinprick in all 3 divisions bilaterally. Corneal responses are intact.  CN VII: Face is symmetric with normal eye closure and smile. CN VIII: Hearing is normal to rubbing fingers CN IX, X: Palate elevates symmetrically. Phonation is normal. CN XI: Head turning and shoulder shrug are intact CN XII: Tongue is midline with normal movements and no atrophy.  MOTOR: There is no pronator drift of out-stretched arms. Muscle bulk and tone are normal. Muscle strength is normal.  REFLEXES: Reflexes are 2+ and symmetric at the biceps, triceps, knees, and ankles. Plantar responses are flexor.  SENSORY: Mildly length dependent decreased to light touch, pinprick to ankle level COORDINATION: Rapid alternating movements and fine finger movements are intact. There is no dysmetria on  finger-to-nose and heel-knee-shin.    GAIT/STANCE: Need to push up to get up from seated position, wide based mildly unsteady   DIAGNOSTIC DATA (LABS, IMAGING, TESTING) - I reviewed patient records, labs, notes, testing and imaging myself where available.   ASSESSMENT AND PLAN  IZAIYAH KLEINMAN is a 73 y.o. 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 Ph.D.  [facility] [address] [place], [place] [address] Ph: [phone] Fax: [phone]  CC: [name], MD

## 2018-05-14 ENCOUNTER — Other Ambulatory Visit: Payer: Self-pay | Admitting: Family Medicine

## 2018-05-14 ENCOUNTER — Other Ambulatory Visit: Payer: Self-pay | Admitting: Gastroenterology

## 2018-05-14 DIAGNOSIS — R1319 Other dysphagia: Secondary | ICD-10-CM

## 2018-05-14 DIAGNOSIS — R1310 Dysphagia, unspecified: Secondary | ICD-10-CM

## 2018-05-14 DIAGNOSIS — K219 Gastro-esophageal reflux disease without esophagitis: Secondary | ICD-10-CM

## 2018-05-15 NOTE — Telephone Encounter (Signed)
Last office visit 04/13/2018 for poor balance.  Last refilled 02/13/2018 for #180 with no refills.  Dosage has changed to taking one tablet daily at bedtime.  CPE scheduled for 01/10/2019.  Ok to refill?

## 2018-05-17 ENCOUNTER — Ambulatory Visit (INDEPENDENT_AMBULATORY_CARE_PROVIDER_SITE_OTHER): Payer: Medicare Other

## 2018-05-17 DIAGNOSIS — E291 Testicular hypofunction: Secondary | ICD-10-CM | POA: Diagnosis not present

## 2018-05-17 MED ORDER — TESTOSTERONE CYPIONATE 200 MG/ML IM SOLN
150.0000 mg | INTRAMUSCULAR | Status: DC
  Administered 2018-05-17 – 2018-10-03 (×3): 150 mg via INTRAMUSCULAR

## 2018-05-17 NOTE — Progress Notes (Signed)
Per orders of Dr. Lorelei Pont, injection of Testosterone injestion given by Kris Mouton. Patient tolerated injection well. Gets these injections every 14 days.

## 2018-05-18 ENCOUNTER — Other Ambulatory Visit: Payer: Self-pay | Admitting: *Deleted

## 2018-05-18 ENCOUNTER — Other Ambulatory Visit: Payer: Self-pay

## 2018-05-18 MED ORDER — MEMANTINE HCL 10 MG PO TABS: 10.0000 mg | ORAL_TABLET | Freq: Two times a day (BID) | ORAL | 3 refills | Status: DC

## 2018-05-18 MED ORDER — SEMAGLUTIDE(0.25 OR 0.5MG/DOS) 2 MG/1.5ML ~~LOC~~ SOPN: 0.5000 mg | PEN_INJECTOR | SUBCUTANEOUS | 5 refills | Status: DC

## 2018-05-18 MED ORDER — TESTOSTERONE CYPIONATE 200 MG/ML IM SOLN: INTRAMUSCULAR | 1 refills | Status: DC

## 2018-05-18 MED ORDER — DOXAZOSIN MESYLATE 8 MG PO TABS: 4.0000 mg | ORAL_TABLET | Freq: Every day | ORAL | 3 refills | Status: DC

## 2018-05-18 MED ORDER — VENLAFAXINE HCL ER 75 MG PO CP24: ORAL_CAPSULE | ORAL | 1 refills | Status: DC

## 2018-05-18 MED ORDER — DONEPEZIL HCL 10 MG PO TABS: 10.0000 mg | ORAL_TABLET | Freq: Every day | ORAL | 3 refills | Status: DC

## 2018-05-18 MED ORDER — SIMVASTATIN 40 MG PO TABS: 40.0000 mg | ORAL_TABLET | Freq: Every day | ORAL | 3 refills | Status: DC

## 2018-05-18 MED ORDER — FLUTICASONE PROPIONATE 50 MCG/ACT NA SUSP: NASAL | 3 refills | Status: DC

## 2018-05-18 MED ORDER — ATENOLOL 50 MG PO TABS: 50.0000 mg | ORAL_TABLET | Freq: Every day | ORAL | 3 refills | Status: DC

## 2018-05-18 NOTE — Telephone Encounter (Signed)
Faxed refill request received from [facility].

## 2018-05-18 NOTE — Telephone Encounter (Signed)
Last office visit 04/13/2018 for poor balance.  Last refilled 11/15/2017 for 10 ml with 1 refill.  Next appt with Dr. Lorelei Pont 01/10/2019 for CPE.

## 2018-05-22 ENCOUNTER — Other Ambulatory Visit: Payer: Self-pay | Admitting: *Deleted

## 2018-05-22 MED ORDER — LISINOPRIL 10 MG PO TABS: 10.0000 mg | ORAL_TABLET | Freq: Every day | ORAL | 1 refills | Status: DC

## 2018-05-31 ENCOUNTER — Ambulatory Visit (INDEPENDENT_AMBULATORY_CARE_PROVIDER_SITE_OTHER): Payer: Medicare Other

## 2018-05-31 DIAGNOSIS — E291 Testicular hypofunction: Secondary | ICD-10-CM

## 2018-05-31 NOTE — Progress Notes (Signed)
Per orders of Dr. Lorelei Pont injection of Testosterone given by Kris Mouton. Patient tolerated injection well. Patient gets these injections every 2 weeks.

## 2018-06-01 ENCOUNTER — Ambulatory Visit: Payer: Medicare Other

## 2018-06-15 ENCOUNTER — Ambulatory Visit: Payer: Medicare Other

## 2018-06-15 ENCOUNTER — Ambulatory Visit (INDEPENDENT_AMBULATORY_CARE_PROVIDER_SITE_OTHER): Payer: Medicare Other

## 2018-06-15 DIAGNOSIS — E291 Testicular hypofunction: Secondary | ICD-10-CM

## 2018-06-15 MED ORDER — TESTOSTERONE CYPIONATE 200 MG/ML IM SOLN
150.0000 mg | Freq: Once | INTRAMUSCULAR | Status: AC
  Administered 2018-06-15: 150 mg via INTRAMUSCULAR

## 2018-06-15 NOTE — Progress Notes (Signed)
Per orders of Dr. Diona Browner, injection of testosterone given by Brenton Grills. Patient tolerated injection well.

## 2018-06-21 ENCOUNTER — Other Ambulatory Visit: Payer: Self-pay

## 2018-06-21 ENCOUNTER — Encounter: Payer: Self-pay | Admitting: Family Medicine

## 2018-06-21 ENCOUNTER — Ambulatory Visit (INDEPENDENT_AMBULATORY_CARE_PROVIDER_SITE_OTHER): Payer: Medicare Other | Admitting: Family Medicine

## 2018-06-21 VITALS — BP 84/44 | HR 92 | Temp 97.2°F | Ht 71.0 in | Wt 211.0 lb

## 2018-06-21 DIAGNOSIS — J189 Pneumonia, unspecified organism: Secondary | ICD-10-CM

## 2018-06-21 DIAGNOSIS — J181 Lobar pneumonia, unspecified organism: Secondary | ICD-10-CM | POA: Diagnosis not present

## 2018-06-21 DIAGNOSIS — N183 Chronic kidney disease, stage 3 unspecified: Secondary | ICD-10-CM

## 2018-06-21 DIAGNOSIS — Z87891 Personal history of nicotine dependence: Secondary | ICD-10-CM

## 2018-06-21 MED ORDER — CEFDINIR 300 MG PO CAPS: 600.0000 mg | ORAL_CAPSULE | Freq: Every day | ORAL | 0 refills | Status: AC

## 2018-06-21 NOTE — Progress Notes (Signed)
Dr. Frederico Hamman T. Freada Twersky, MD, [facility] [place], [address] Phone: [phone] Fax: [phone]  06/21/2018  Patient: [name], [name]: [id number], DOB: April 27, 1945, 73 y.o.  Primary Physician:  [name], MD   Chief Complaint  Patient presents with  . Cough    with yellow/brownish/green phelgm x 4/5 days   Subjective:   [name] is a 73 y.o. very pleasant male patient who presents with the following:  No fever. Bad cough and cong of yellow / brown.    BP Readings from Last 3 Encounters:  06/21/18 (!) 84/44  05/08/18 135/82  04/28/18 110/60    He is having some mild runny nose, he denies any polyarthralgias.  No sinus pain, no earache. Primary complaint is pulmonary.  Past Medical History, Surgical History, Social History, Family History, Problem List, Medications, and Allergies have been reviewed and updated if relevant.  Patient Active Problem List   Diagnosis Date Noted  . Uncontrolled type 2 diabetes mellitus with peripheral neuropathy ([place]) 05/11/2016    Priority: Medium  . Former very heavy cigarette smoker (more than 40 per day) 10/07/2014    Priority: Medium  . Chronic kidney disease, stage III (moderate) ([place]) 12/19/2012    Priority: Medium  . OSA (obstructive sleep apnea) 03/09/2011    Priority: Medium  . Diabetic neuropathy ([place]) 12/29/2010    Priority: Medium  . Hypertriglyceridemia 06/13/2008    Priority: Medium  . Major depressive disorder, recurrent episode, in partial remission ([place]) 06/13/2008    Priority: Medium  . Essential hypertension 06/13/2008    Priority: Medium  . Mild cognitive impairment 05/08/2018  . Alteration consciousness 05/08/2018  . Poor balance 02/23/2018  . Falls 10/14/2017  . Esophageal dysphagia   . Mild cognitive disorder 06/29/2016  . Erectile dysfunction associated with type 2 diabetes mellitus ([place]) 05/28/2015  . Hypogonadism male  06/14/2012  . Obesity (BMI 30-39.9) 09/27/2011  . Family history of MI (myocardial infarction) 09/16/2010  . HIP REPLACEMENT, RIGHT, HX OF 02/05/2010  . GOUT 06/13/2008  . ALLERGIC RHINITIS 06/13/2008  . GERD 06/13/2008  . OSTEOARTHRITIS 06/13/2008    Past Medical History:  Diagnosis Date  . Allergic rhinitis due to pollen   . Chronic kidney disease, stage III (moderate) ([place]) 12/19/2012  . Depression   . Diabetes mellitus ([place])   . Diverticulosis   . Erectile dysfunction associated with type 2 diabetes mellitus ([place]) 05/28/2015  . Former very heavy cigarette smoker (more than 40 per day) 10/07/2014  . GERD (gastroesophageal reflux disease)   . Gout   . Headache   . Hyperlipidemia   . Hypertension   . Hypogonadism male 06/14/2012  . Iron deficiency   . Low testosterone   . Memory loss   . Osteoarthritis   . Personal history of colonic polyps    1996    Past Surgical History:  Procedure Laterality Date  . CARDIAC CATHETERIZATION  11/2011   [facility]  . CHOLECYSTECTOMY  1980  . ESOPHAGOGASTRODUODENOSCOPY (EGD) WITH PROPOFOL N/A 09/27/2017   Procedure: ESOPHAGOGASTRODUODENOSCOPY (EGD) WITH PROPOFOL;  Surgeon: Lin Landsman, MD;  Location: [place];  Service: Gastroenterology;  Laterality: N/A;  . EYE SURGERY Right 07/29/2016   cataract - Dr. Manuella Ghazi  . EYE SURGERY Left 08/23/2106   cataract - Dr. Manuella Ghazi  . TOTAL HIP ARTHROPLASTY  2009   Dr. Gladstone Lighter  . TOTAL HIP ARTHROPLASTY      Social History   Socioeconomic  History  . Marital status: Widowed    Spouse name: Not on file  . Number of children: 4  . Years of education: 12th  . Highest education level: Not on file  Occupational History  . Occupation: Counsellor  . 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 Financial resource strain: Not on file  . Food insecurity:    Worry: Not on file    Inability: Not on file  . Transportation needs:    Medical: Not on file     Non-medical: Not on file  Tobacco Use  . Smoking status: Former Smoker    Packs/day: 3.00    Years: 35.00    Pack years: 105.00    Types: Cigarettes    Last attempt to quit: 04/12/2000    Years since quitting: 18.2  . Smokeless tobacco: Never Used  Substance and Sexual Activity  . Alcohol use: No  . Drug use: No  . Sexual activity: Never  Lifestyle  . Physical activity:    Days per week: Not on file    Minutes per session: Not on file  . Stress: Not on file  Relationships  . Social connections:    Talks on phone: Not on file    Gets together: Not on file    Attends religious service: Not on file    Active member of club or organization: Not on file    Attends meetings of clubs or organizations: Not on file    Relationship status: Not on file  . Intimate partner violence:    Fear of current or ex partner: Not on file    Emotionally abused: Not on file    Physically abused: Not on file    Forced sexual activity: Not on file  Other Topics Concern  . Not on file  Social History Narrative   No regular exercise   Caffeine use: yes, dt coke   Lives at home with his mother lives with him.   Wife [name] recently deceased   Right-handed.    Family History  Problem Relation Age of Onset  . Diabetes Mother   . Alzheimer's disease Mother   . Diabetes Father   . Lung cancer Father        Age 30  . Stroke Father   . Heart attack Father   . Lung cancer Sister        lung  . Heart disease Maternal Grandfather   . COPD Sister   . Heart attack Sister   . Heart disease Sister     Allergies  Allergen Reactions  . Tetracycline Itching and Rash    Medication list reviewed and updated in full in [facility].  ROS: GEN: Acute illness details above GI: Tolerating PO intake GU: maintaining adequate hydration and urination Pulm: No SOB Interactive and getting along well at home.  Otherwise, ROS is as per the HPI.  Objective:   BP (!) 84/44   Pulse 92   Temp (!) 97.2  F (36.2 C) (Oral)   Ht 5\' 11"  (1.803 m)   Wt 211 lb (95.7 kg)   SpO2 98%   BMI 29.43 kg/m    GEN: A and O x 3. WDWN. NAD.    ENT: Nose clear, ext NML.  No LAD.  No JVD.  TM's clear. Oropharynx clear.  PULM: Normal WOB, no distress. No wheezes, rhonchi. RLL crackles CV: RRR, no M/G/R, No rubs, No JVD.  EXT: warm and well-perfused, No c/c/e. PSYCH: Pleasant and conversant.    Laboratory and Imaging Data:  Assessment and Plan:   Community acquired pneumonia of right lower lobe of lung ([place])  Former very heavy cigarette smoker (more than 40 per day)  Chronic kidney disease, stage III (moderate) ([place])  Concern for RLL pna, start omnicef  Follow-up: No follow-ups on file.  Meds ordered this encounter  Medications  . cefdinir (OMNICEF) 300 MG capsule    Sig: Take 2 capsules (600 mg total) by mouth daily for 10 days.    Dispense:  20 capsule    Refill:  0   Signed,  Jaree Trinka T. Zaveon Gillen, MD   Outpatient Encounter Medications as of 06/21/2018  Medication Sig  . ampicillin (PRINCIPEN) 500 MG capsule TAKE 1 CAPSULE DAILY WITH FOOD ORALLY 30 DAYS  . aspirin EC 81 MG tablet Take 81 mg by mouth at bedtime.  [name] Kitchen atenolol (TENORMIN) 50 MG tablet Take 1 tablet (50 mg total) by mouth daily.  [name] Kitchen buPROPion (WELLBUTRIN SR) 150 MG 12 hr tablet TAKE 1 TABLET BY MOUTH TWICE A DAY  . cholecalciferol (VITAMIN D) 1000 UNITS tablet Take 1,000 Units by mouth daily.    [name] Kitchen donepezil (ARICEPT) 10 MG tablet Take 1 tablet (10 mg total) by mouth at bedtime.  [name] Kitchen doxazosin (CARDURA) 8 MG tablet Take 0.5 tablets (4 mg total) by mouth at bedtime.  . finasteride (PROSCAR) 5 MG tablet Take 1 tablet (5 mg total) by mouth daily.  . fluticasone (FLONASE) 50 MCG/ACT nasal spray INHALE 2 SPRAYS INTO EACH NOSTRIL EVERY DAY AS NEEDED FOR CONGESTION  . gabapentin (NEURONTIN) 300 MG capsule Take 1 capsule (300 mg total) by mouth at bedtime.  [name] Kitchen ibuprofen (ADVIL,MOTRIN) 200 MG tablet Take 200-400 mg by mouth every 6  (six) hours as needed for headache (pain).  . insulin NPH Human (HUMULIN N,NOVOLIN N) 100 UNIT/ML injection Inject 0.25 mLs (25 Units total) into the skin 2 (two) times daily before a meal.  . insulin regular (NOVOLIN R,HUMULIN R) 100 units/mL injection Inject 0.2 mLs (20 Units total) into the skin 2 (two) times daily before a meal.  . lisinopril (PRINIVIL,ZESTRIL) 10 MG tablet Take 1 tablet (10 mg total) by mouth daily.  . memantine (NAMENDA) 10 MG tablet Take 1 tablet (10 mg total) by mouth 2 (two) times daily.  [name] Kitchen nystatin cream (MYCOSTATIN) Apply 1 application topically 2 (two) times daily.  . Semaglutide,0.25 or 0.5MG /DOS, (OZEMPIC, 0.25 OR 0.5 MG/DOSE,) 2 MG/1.5ML SOPN Inject 0.5 mg into the skin once a week.  . sildenafil (REVATIO) 20 MG tablet Generic Revatio / Sildanefil 20 mg. 2 - 5 tabs 30 mins prior to intercourse.  . simvastatin (ZOCOR) 40 MG tablet Take 1 tablet (40 mg total) by mouth at bedtime.  [name] Kitchen testosterone cypionate (DEPOTESTOSTERONE CYPIONATE) 200 MG/ML injection Inject 150 mg every 14 days  . triamcinolone cream (KENALOG) 0.1 %   . venlafaxine XR (EFFEXOR-XR) 75 MG 24 hr capsule TAKE ONE CAPSULE (75 MG) BY MOUTH EVERY MORNING AND TWO CAPSULES (150 MG) AT NIGHT  . cefdinir (OMNICEF) 300 MG capsule Take 2 capsules (600 mg total) by mouth daily for 10 days.  [name] Kitchen omeprazole (PRILOSEC) 40 MG capsule TAKE 1 CAPSULE (40 MG TOTAL) BY MOUTH 2 (TWO) TIMES DAILY BEFORE A MEAL.   Facility-Administered Encounter Medications as of 06/21/2018  Medication  . testosterone cypionate (DEPOTESTOSTERONE CYPIONATE) injection 150 mg  . testosterone cypionate (DEPOTESTOSTERONE CYPIONATE) injection 150 mg

## 2018-06-28 ENCOUNTER — Other Ambulatory Visit: Payer: Self-pay

## 2018-06-28 ENCOUNTER — Ambulatory Visit (INDEPENDENT_AMBULATORY_CARE_PROVIDER_SITE_OTHER): Payer: Medicare Other

## 2018-06-28 DIAGNOSIS — E291 Testicular hypofunction: Secondary | ICD-10-CM | POA: Diagnosis not present

## 2018-06-28 MED ORDER — TESTOSTERONE CYPIONATE 200 MG/ML IM SOLN
150.0000 mg | Freq: Once | INTRAMUSCULAR | Status: AC
  Administered 2018-06-28: 150 mg via INTRAMUSCULAR

## 2018-06-28 NOTE — Progress Notes (Signed)
Per orders of Dr. Lorelei Pont, injection of testosterone given by Brenton Grills. Patient tolerated injection well.

## 2018-06-29 ENCOUNTER — Ambulatory Visit: Payer: Medicare Other

## 2018-07-03 ENCOUNTER — Telehealth: Payer: Self-pay | Admitting: Family Medicine

## 2018-07-03 ENCOUNTER — Other Ambulatory Visit: Payer: Self-pay

## 2018-07-03 MED ORDER — PREDNISONE 20 MG PO TABS: ORAL_TABLET | ORAL | 0 refills | Status: DC

## 2018-07-03 MED ORDER — LEVOFLOXACIN 500 MG PO TABS: 500.0000 mg | ORAL_TABLET | Freq: Every day | ORAL | 0 refills | Status: DC

## 2018-07-03 NOTE — Telephone Encounter (Signed)
Prescriptions sent to [facility] in [facility] as instructed by Dr. Lorelei Pont.  Left message for [name] that prescriptions have been sent to [facility] in [place].

## 2018-07-03 NOTE — Telephone Encounter (Signed)
Pt called to give update on his condition from 3/11 appt. He said he still has some discomfort when breathing and feels like the R lung. He said his condition has not changed much since visit and is wondering if he needs to be evaluated or if you can send in medication. Cough has got better- just a few times a day.

## 2018-07-03 NOTE — Patient Outreach (Signed)
[facility]) Care Management  07/03/2018  ADNAN VANVOORHIS May 08, 1945 [id number]   Medication Adherence call to Mr. [name] patient did not answer patient is due on Ozempic. [facility] is [facility] past due under [place].   [place] Management Direct Dial [phone]  Fax [phone] [email or web address]

## 2018-07-03 NOTE — Telephone Encounter (Signed)
Extend meds and add prednisone  Levaquin 500 mg, 1 po daily x 7 days, #7  Prednisone 20 mg, 2 tabs po for 5 days, then 1 tab po for 5 days, #15, 0 ref

## 2018-07-10 ENCOUNTER — Ambulatory Visit: Payer: Medicare Other | Admitting: Family Medicine

## 2018-07-12 ENCOUNTER — Ambulatory Visit: Payer: Medicare Other

## 2018-07-12 ENCOUNTER — Ambulatory Visit: Payer: Medicare Other | Admitting: Family Medicine

## 2018-07-12 ENCOUNTER — Ambulatory Visit (INDEPENDENT_AMBULATORY_CARE_PROVIDER_SITE_OTHER)
Admission: RE | Admit: 2018-07-12 | Discharge: 2018-07-12 | Disposition: A | Discharge status: Home / Self Care | Payer: Medicare Other | Source: Ambulatory Visit | Attending: Family Medicine | Admitting: Family Medicine

## 2018-07-12 ENCOUNTER — Ambulatory Visit (INDEPENDENT_AMBULATORY_CARE_PROVIDER_SITE_OTHER): Payer: Medicare Other | Admitting: Family Medicine

## 2018-07-12 ENCOUNTER — Other Ambulatory Visit: Payer: Self-pay

## 2018-07-12 ENCOUNTER — Encounter: Payer: Self-pay | Admitting: Family Medicine

## 2018-07-12 VITALS — BP 144/70 | HR 125 | Temp 98.6°F | Ht 71.0 in | Wt 207.0 lb

## 2018-07-12 DIAGNOSIS — Z794 Long term (current) use of insulin: Secondary | ICD-10-CM

## 2018-07-12 DIAGNOSIS — E291 Testicular hypofunction: Secondary | ICD-10-CM | POA: Diagnosis not present

## 2018-07-12 DIAGNOSIS — M25572 Pain in left ankle and joints of left foot: Secondary | ICD-10-CM | POA: Diagnosis not present

## 2018-07-12 DIAGNOSIS — E1140 Type 2 diabetes mellitus with diabetic neuropathy, unspecified: Secondary | ICD-10-CM

## 2018-07-12 DIAGNOSIS — M10072 Idiopathic gout, left ankle and foot: Secondary | ICD-10-CM

## 2018-07-12 DIAGNOSIS — M79672 Pain in left foot: Secondary | ICD-10-CM | POA: Diagnosis not present

## 2018-07-12 MED ORDER — COLCHICINE 0.6 MG PO TABS: 0.6000 mg | ORAL_TABLET | Freq: Two times a day (BID) | ORAL | 2 refills | Status: DC

## 2018-07-12 MED ORDER — INDOMETHACIN 50 MG PO CAPS: 50.0000 mg | ORAL_CAPSULE | Freq: Two times a day (BID) | ORAL | 0 refills | Status: DC

## 2018-07-12 MED ORDER — TESTOSTERONE CYPIONATE 200 MG/ML IM SOLN
150.0000 mg | Freq: Once | INTRAMUSCULAR | Status: AC
  Administered 2018-07-12: 15:00:00 150 mg via INTRAMUSCULAR

## 2018-07-12 MED ORDER — GABAPENTIN 300 MG PO CAPS: 600.0000 mg | ORAL_CAPSULE | Freq: Every day | ORAL | 1 refills | Status: DC

## 2018-07-12 NOTE — Progress Notes (Signed)
Ariannah Arenson T. Magen Suriano, MD Primary Care and [place] at [facility] [place], [address] Phone: [phone]  FAX: [phone]  DWON SKY - 73 y.o. male  [name] [id number]  Date of Birth: 03/13/1946  Visit Date: 07/12/2018  PCP: [name], MD  Referred by: [name], MD  Chief Complaint  Patient presents with  . Ankle Pain    Left   Subjective:   [name] is a 73 y.o. very pleasant male patient who presents with the following:  Not able to move his ankle and woke up this way the other day.  He is having a lot of pain and limitation of motion in the ankle itself.  He is having lateral ankle pain.  He does have some significantly decreased sensation secondary to diabetic neuropathy.  There is some mild swelling in the ankle and laterally.  Does not appear to be reddened.  He also does have a history of significant diabetic neuropathy, and he is interested in possibly increasing his gabapentin dose.  He also does have hypogonadism and is on testosterone injections.  Gabapentin 1 capsulte - increase?  Past Medical History, Surgical History, Social History, Family History, Problem List, Medications, and Allergies have been reviewed and updated if relevant.  Patient Active Problem List   Diagnosis Date Noted  . Uncontrolled type 2 diabetes mellitus with peripheral neuropathy ([place]) 05/11/2016    Priority: Medium  . Former very heavy cigarette smoker (more than 40 per day) 10/07/2014    Priority: Medium  . Chronic kidney disease, stage III (moderate) ([place]) 12/19/2012    Priority: Medium  . OSA (obstructive sleep apnea) 03/09/2011    Priority: Medium  . Diabetic neuropathy ([place]) 12/29/2010    Priority: Medium  . Hypertriglyceridemia 06/13/2008    Priority: Medium  . Major depressive disorder, recurrent episode, in partial remission ([name]) 06/13/2008    Priority: Medium  . Essential hypertension  06/13/2008    Priority: Medium  . Mild cognitive impairment 05/08/2018  . Alteration consciousness 05/08/2018  . Poor balance 02/23/2018  . Falls 10/14/2017  . Esophageal dysphagia   . Erectile dysfunction associated with type 2 diabetes mellitus ([place]) 05/28/2015  . Hypogonadism male 06/14/2012  . Obesity (BMI 30-39.9) 09/27/2011  . Family history of MI (myocardial infarction) 09/16/2010  . HIP REPLACEMENT, RIGHT, HX OF 02/05/2010  . GOUT 06/13/2008  . ALLERGIC RHINITIS 06/13/2008  . GERD 06/13/2008  . OSTEOARTHRITIS 06/13/2008    Past Medical History:  Diagnosis Date  . Allergic rhinitis due to pollen   . Chronic kidney disease, stage III (moderate) ([place]) 12/19/2012  . Depression   . Diabetes mellitus ([place])   . Diverticulosis   . Erectile dysfunction associated with type 2 diabetes mellitus ([place]) 05/28/2015  . Former very heavy cigarette smoker (more than 40 per day) 10/07/2014  . GERD (gastroesophageal reflux disease)   . Gout   . Headache   . Hyperlipidemia   . Hypertension   . Hypogonadism male 06/14/2012  . Iron deficiency   . Low testosterone   . Memory loss   . Osteoarthritis   . Personal history of colonic polyps    1996    Past Surgical History:  Procedure Laterality Date  . CARDIAC CATHETERIZATION  11/2011   [facility]  . CHOLECYSTECTOMY  1980  . ESOPHAGOGASTRODUODENOSCOPY (EGD) WITH PROPOFOL N/A 09/27/2017   Procedure: ESOPHAGOGASTRODUODENOSCOPY (EGD) WITH PROPOFOL;  Surgeon: Lin Landsman, MD;  Location: [place] [facility];  Service: Gastroenterology;  Laterality: N/A;  . EYE SURGERY Right 07/29/2016   [facility] - Dr. Manuella Ghazi  . EYE SURGERY Left 08/23/2106   [facility] - Dr. Manuella Ghazi  . TOTAL HIP ARTHROPLASTY  2009   Dr. Gladstone Lighter  . TOTAL HIP ARTHROPLASTY      Social History   Socioeconomic History  . Marital status: Widowed    Spouse name: Not on file  . Number of children: 4  . Years of education: 12th  . Highest education level: Not on file  Occupational  History  . Occupation: Counsellor  . 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 Financial resource strain: Not on file  . Food insecurity:    Worry: Not on file    Inability: Not on file  . Transportation needs:    Medical: Not on file    Non-medical: Not on file  Tobacco Use  . Smoking status: Former Smoker    Packs/day: 3.00    Years: 35.00    Pack years: 105.00    Types: Cigarettes    Last attempt to quit: 04/12/2000    Years since quitting: 18.2  . Smokeless tobacco: Never Used  Substance and Sexual Activity  . Alcohol use: No  . Drug use: No  . Sexual activity: Never  Lifestyle  . Physical activity:    Days per week: Not on file    Minutes per session: Not on file  . Stress: Not on file  Relationships  . Social connections:    Talks on phone: Not on file    Gets together: Not on file    Attends religious service: Not on file    Active member of club or organization: Not on file    Attends meetings of clubs or organizations: Not on file    Relationship status: Not on file  . Intimate partner violence:    Fear of current or ex partner: Not on file    Emotionally abused: Not on file    Physically abused: Not on file    Forced sexual activity: Not on file  Other Topics Concern  . Not on file  Social History Narrative   No regular exercise   Caffeine use: yes, dt coke   Lives at home with his mother lives with him.   Wife Fraser Din recently deceased   Right-handed.    Family History  Problem Relation Age of Onset  . Diabetes Mother   . Alzheimer's disease Mother   . Diabetes Father   . Lung cancer Father        Age 79  . Stroke Father   . Heart attack Father   . Lung cancer Sister        lung  . Heart disease Maternal Grandfather   . COPD Sister   . Heart attack Sister   . Heart disease Sister     Allergies  Allergen Reactions  . Tetracycline Itching and Rash    Medication list reviewed and updated in full in [place].  GEN: no acute illness or fever CV: No chest pain or shortness of breath MSK: detailed above Neuro: neurological signs are described above ROS O/w per HPI  Objective:   BP (!) 144/70   Pulse (!) 125   Temp 98.6 F (37 C) (Oral)   Ht 5\' 11"  (1.803 m)   Wt 207 lb (93.9 kg)   SpO2 99%   BMI 28.87 kg/m  GEN: WDWN, NAD, Non-toxic, Alert & Oriented x 3 HEENT: Atraumatic, Normocephalic.  Ears and Nose: No external deformity. EXTR: No clubbing/cyanosis/edema NEURO: Normal gait. Some antalgia and decreased sensation globally PSYCH: Normally interactive. Conversant. Not depressed or anxious appearing.  Calm demeanor.    Globally the foot is nontender at all bony anatomy including all MT, all midfoot bones.  Some swelling at lateral malleous. Mild tenderness at the talus.  Decreased sensation limits much of exam.  Approx 60% global loss of motion with intact str Globally the foot and all bony anatomy is nontender.  This includes the metacarpals as well as the phalanges.  Radiology: Dg Ankle Complete Left  Result Date: 07/12/2018 CLINICAL DATA:  Left ankle pain.  No injury. EXAM: LEFT ANKLE COMPLETE - 3+ VIEW COMPARISON:  None. FINDINGS: No fracture or bone lesion. Ankle joint normally spaced and aligned. Small marginal spurs noted at the anterior articular surface of the distal tibia. No other degenerative change. Soft tissues are unremarkable. IMPRESSION: 1. No fracture or acute finding. 2. Minor ankle joint degenerative change. Electronically Signed   By: Lajean Manes M.D.   On: 07/12/2018 14:49   Dg Foot Complete Left  Result Date: 07/12/2018 CLINICAL DATA:  Pain.  No trauma. EXAM: LEFT FOOT - COMPLETE 3+ VIEW COMPARISON:  None. FINDINGS: No fracture or bone lesion. Minor asymmetric joint space narrowing at the first metatarsophalangeal joint with small marginal osteophytes consistent with osteoarthritis. Remaining joints normally spaced and aligned. Small plantar  and dorsal calcaneal spurs. Soft tissues are unremarkable. IMPRESSION: 1. No fracture or acute finding. 2. Minor first metatarsophalangeal joint osteoarthritis. 3. Calcaneal spurs. Electronically Signed   By: Lajean Manes M.D.   On: 07/12/2018 14:48     Assessment and Plan:   Acute idiopathic gout of left ankle  Acute left ankle pain - Plan: DG Ankle Complete Left, DG Foot Complete Left  Type 2 diabetes mellitus with diabetic neuropathy, with long-term current use of insulin (HCC) - Plan: DG Ankle Complete Left, DG Foot Complete Left  Hypogonadism male - Plan: testosterone cypionate (DEPOTESTOSTERONE CYPIONATE) injection 150 mg  No fx, acute onset c/w acute gout flare, which he has had in the past.   Indocin for a few days along with colchicine.  Increase gabapentin to 600 qhs only - fall risk, but severe neuropathy  Test injection today  Follow-up: No follow-ups on file.  Meds ordered this encounter  Medications  . indomethacin (INDOCIN) 50 MG capsule    Sig: Take 1 capsule (50 mg total) by mouth 2 (two) times daily with a meal.    Dispense:  30 capsule    Refill:  0  . colchicine 0.6 MG tablet    Sig: Take 1 tablet (0.6 mg total) by mouth 2 (two) times daily.    Dispense:  60 tablet    Refill:  2  . gabapentin (NEURONTIN) 300 MG capsule    Sig: Take 2 capsules (600 mg total) by mouth at bedtime.    Dispense:  90 capsule    Refill:  1  . testosterone cypionate (DEPOTESTOSTERONE CYPIONATE) injection 150 mg   Orders Placed This Encounter  Procedures  . DG Ankle Complete Left  . DG Foot Complete Left    Signed,  Twylia Oka T. Naman Spychalski, MD   Outpatient Encounter Medications as of 07/12/2018  Medication Sig  . ampicillin (PRINCIPEN) 500 MG capsule TAKE 1 CAPSULE DAILY WITH FOOD ORALLY 30 DAYS  . aspirin EC 81 MG tablet Take 81 mg  by mouth at bedtime.  [name] Kitchen atenolol (TENORMIN) 50 MG tablet Take 1 tablet (50 mg total) by mouth daily.  [name] Kitchen buPROPion (WELLBUTRIN SR) 150 MG 12 hr  tablet TAKE 1 TABLET BY MOUTH TWICE A DAY  . cholecalciferol (VITAMIN D) 1000 UNITS tablet Take 1,000 Units by mouth daily.    [name] Kitchen donepezil (ARICEPT) 10 MG tablet Take 1 tablet (10 mg total) by mouth at bedtime.  [name] Kitchen doxazosin (CARDURA) 8 MG tablet Take 0.5 tablets (4 mg total) by mouth at bedtime.  . finasteride (PROSCAR) 5 MG tablet Take 1 tablet (5 mg total) by mouth daily.  . fluticasone (FLONASE) 50 MCG/ACT nasal spray INHALE 2 SPRAYS INTO EACH NOSTRIL EVERY DAY AS NEEDED FOR CONGESTION  . gabapentin (NEURONTIN) 300 MG capsule Take 2 capsules (600 mg total) by mouth at bedtime.  [name] Kitchen ibuprofen (ADVIL,MOTRIN) 200 MG tablet Take 200-400 mg by mouth every 6 (six) hours as needed for headache (pain).  . insulin NPH Human (HUMULIN N,NOVOLIN N) 100 UNIT/ML injection Inject 0.25 mLs (25 Units total) into the skin 2 (two) times daily before a meal.  . insulin regular (NOVOLIN R,HUMULIN R) 100 units/mL injection Inject 0.2 mLs (20 Units total) into the skin 2 (two) times daily before a meal.  . levofloxacin (LEVAQUIN) 500 MG tablet Take 1 tablet (500 mg total) by mouth daily.  [name] Kitchen lisinopril (PRINIVIL,ZESTRIL) 10 MG tablet Take 1 tablet (10 mg total) by mouth daily.  . memantine (NAMENDA) 10 MG tablet Take 1 tablet (10 mg total) by mouth 2 (two) times daily.  [name] Kitchen nystatin cream (MYCOSTATIN) Apply 1 application topically 2 (two) times daily.  . predniSONE (DELTASONE) 20 MG tablet Take 2 tabs po for 5 days, then 1 tab po for 5 days  . Semaglutide,0.25 or 0.5MG /DOS, (OZEMPIC, 0.25 OR 0.5 MG/DOSE,) 2 MG/1.5ML SOPN Inject 0.5 mg into the skin once a week.  . sildenafil (REVATIO) 20 MG tablet Generic Revatio / Sildanefil 20 mg. 2 - 5 tabs 30 mins prior to intercourse.  . simvastatin (ZOCOR) 40 MG tablet Take 1 tablet (40 mg total) by mouth at bedtime.  [name] Kitchen testosterone cypionate (DEPOTESTOSTERONE CYPIONATE) 200 MG/ML injection Inject 150 mg every 14 days  . triamcinolone cream (KENALOG) 0.1 %   . venlafaxine XR  (EFFEXOR-XR) 75 MG 24 hr capsule TAKE ONE CAPSULE (75 MG) BY MOUTH EVERY MORNING AND TWO CAPSULES (150 MG) AT NIGHT  . [DISCONTINUED] gabapentin (NEURONTIN) 300 MG capsule Take 1 capsule (300 mg total) by mouth at bedtime.  . colchicine 0.6 MG tablet Take 1 tablet (0.6 mg total) by mouth 2 (two) times daily.  . indomethacin (INDOCIN) 50 MG capsule Take 1 capsule (50 mg total) by mouth 2 (two) times daily with a meal.  . omeprazole (PRILOSEC) 40 MG capsule TAKE 1 CAPSULE (40 MG TOTAL) BY MOUTH 2 (TWO) TIMES DAILY BEFORE A MEAL.   Facility-Administered Encounter Medications as of 07/12/2018  Medication  . testosterone cypionate (DEPOTESTOSTERONE CYPIONATE) injection 150 mg  . testosterone cypionate (DEPOTESTOSTERONE CYPIONATE) injection 150 mg  . [COMPLETED] testosterone cypionate (DEPOTESTOSTERONE CYPIONATE) injection 150 mg

## 2018-07-13 ENCOUNTER — Encounter: Payer: Self-pay | Admitting: Family Medicine

## 2018-07-22 ENCOUNTER — Other Ambulatory Visit: Payer: Self-pay | Admitting: Family Medicine

## 2018-07-24 NOTE — Telephone Encounter (Signed)
Do you want [name] to continue taking the indomethcin?   This may be an automatic [facility].  Please advise.

## 2018-07-24 NOTE — Telephone Encounter (Signed)
Definitely not. Decline

## 2018-07-26 ENCOUNTER — Ambulatory Visit (INDEPENDENT_AMBULATORY_CARE_PROVIDER_SITE_OTHER): Payer: Medicare Other | Admitting: *Deleted

## 2018-07-26 DIAGNOSIS — E291 Testicular hypofunction: Secondary | ICD-10-CM | POA: Diagnosis not present

## 2018-07-26 MED ORDER — TESTOSTERONE CYPIONATE 200 MG/ML IM SOLN
150.0000 mg | Freq: Once | INTRAMUSCULAR | Status: AC
  Administered 2018-07-26: 150 mg via INTRAMUSCULAR

## 2018-07-26 NOTE — Progress Notes (Signed)
Per orders of Dr. Lorelei Pont, injection of Testosterone given by Tammi Sou. Patient tolerated injection well.

## 2018-08-01 ENCOUNTER — Ambulatory Visit (INDEPENDENT_AMBULATORY_CARE_PROVIDER_SITE_OTHER): Payer: Medicare Other | Admitting: Family Medicine

## 2018-08-01 ENCOUNTER — Emergency Department (HOSPITAL_COMMUNITY): Payer: Medicare Other

## 2018-08-01 ENCOUNTER — Encounter: Payer: Self-pay | Admitting: Family Medicine

## 2018-08-01 ENCOUNTER — Emergency Department (HOSPITAL_COMMUNITY)
Admission: EM | Admit: 2018-08-01 | Discharge: 2018-08-01 | Discharge status: Against Medical Advice | Payer: Medicare Other | Attending: Emergency Medicine | Admitting: Emergency Medicine

## 2018-08-01 ENCOUNTER — Telehealth: Payer: Self-pay

## 2018-08-01 ENCOUNTER — Encounter (HOSPITAL_COMMUNITY): Payer: Self-pay

## 2018-08-01 ENCOUNTER — Other Ambulatory Visit: Payer: Self-pay

## 2018-08-01 VITALS — BP 167/99 | HR 110 | Temp 97.8°F | Ht 71.0 in

## 2018-08-01 DIAGNOSIS — Z79899 Other long term (current) drug therapy: Secondary | ICD-10-CM | POA: Insufficient documentation

## 2018-08-01 DIAGNOSIS — I129 Hypertensive chronic kidney disease with stage 1 through stage 4 chronic kidney disease, or unspecified chronic kidney disease: Secondary | ICD-10-CM | POA: Insufficient documentation

## 2018-08-01 DIAGNOSIS — Z794 Long term (current) use of insulin: Secondary | ICD-10-CM | POA: Insufficient documentation

## 2018-08-01 DIAGNOSIS — N183 Chronic kidney disease, stage 3 unspecified: Secondary | ICD-10-CM

## 2018-08-01 DIAGNOSIS — R0789 Other chest pain: Secondary | ICD-10-CM | POA: Diagnosis not present

## 2018-08-01 DIAGNOSIS — Z7982 Long term (current) use of aspirin: Secondary | ICD-10-CM | POA: Diagnosis not present

## 2018-08-01 DIAGNOSIS — E1142 Type 2 diabetes mellitus with diabetic polyneuropathy: Secondary | ICD-10-CM

## 2018-08-01 DIAGNOSIS — R739 Hyperglycemia, unspecified: Secondary | ICD-10-CM

## 2018-08-01 DIAGNOSIS — R112 Nausea with vomiting, unspecified: Secondary | ICD-10-CM | POA: Diagnosis not present

## 2018-08-01 DIAGNOSIS — I10 Essential (primary) hypertension: Secondary | ICD-10-CM | POA: Diagnosis not present

## 2018-08-01 DIAGNOSIS — E860 Dehydration: Secondary | ICD-10-CM | POA: Diagnosis not present

## 2018-08-01 DIAGNOSIS — R079 Chest pain, unspecified: Secondary | ICD-10-CM | POA: Diagnosis not present

## 2018-08-01 DIAGNOSIS — E1165 Type 2 diabetes mellitus with hyperglycemia: Secondary | ICD-10-CM

## 2018-08-01 DIAGNOSIS — IMO0002 Reserved for concepts with insufficient information to code with codable children: Secondary | ICD-10-CM

## 2018-08-01 DIAGNOSIS — Z5320 Procedure and treatment not carried out because of patient's decision for unspecified reasons: Secondary | ICD-10-CM | POA: Diagnosis not present

## 2018-08-01 DIAGNOSIS — R1110 Vomiting, unspecified: Secondary | ICD-10-CM

## 2018-08-01 DIAGNOSIS — R109 Unspecified abdominal pain: Secondary | ICD-10-CM | POA: Diagnosis not present

## 2018-08-01 DIAGNOSIS — Z87891 Personal history of nicotine dependence: Secondary | ICD-10-CM | POA: Diagnosis not present

## 2018-08-01 DIAGNOSIS — R197 Diarrhea, unspecified: Secondary | ICD-10-CM

## 2018-08-01 LAB — BASIC METABOLIC PANEL
Anion gap: 16 — ABNORMAL HIGH (ref 5–15)
BUN: 15 mg/dL (ref 8–23)
CO2: 25 mmol/L (ref 22–32)
Calcium: 9.1 mg/dL (ref 8.9–10.3)
Chloride: 87 mmol/L — ABNORMAL LOW (ref 98–111)
Creatinine, Ser: 1.55 mg/dL — ABNORMAL HIGH (ref 0.61–1.24)
GFR calc Af Amer: 51 mL/min — ABNORMAL LOW (ref >60)
GFR calc non Af Amer: 44 mL/min — ABNORMAL LOW (ref >60)
Glucose, Bld: 478 mg/dL — ABNORMAL HIGH (ref 70–99)
Potassium: 3.7 mmol/L (ref 3.5–5.1)
Sodium: 128 mmol/L — ABNORMAL LOW (ref 135–145)

## 2018-08-01 LAB — CBC WITH DIFFERENTIAL/PLATELET
Abs Immature Granulocytes: 0.04 K/uL (ref 0.00–0.07)
Basophils Absolute: 0 K/uL (ref 0.0–0.1)
Basophils Relative: 0 %
Eosinophils Absolute: 0.1 K/uL (ref 0.0–0.5)
Eosinophils Relative: 1 %
HCT: 46 % (ref 39.0–52.0)
Hemoglobin: 16.2 g/dL (ref 13.0–17.0)
Immature Granulocytes: 1 %
Lymphocytes Relative: 14 %
Lymphs Abs: 0.7 K/uL (ref 0.7–4.0)
MCH: 29.9 pg (ref 26.0–34.0)
MCHC: 35.2 g/dL (ref 30.0–36.0)
MCV: 84.9 fL (ref 80.0–100.0)
Monocytes Absolute: 0.8 K/uL (ref 0.1–1.0)
Monocytes Relative: 14 %
Neutro Abs: 3.8 K/uL (ref 1.7–7.7)
Neutrophils Relative %: 70 %
Platelets: 157 K/uL (ref 150–400)
RBC: 5.42 MIL/uL (ref 4.22–5.81)
RDW: 12 % (ref 11.5–15.5)
WBC: 5.4 K/uL (ref 4.0–10.5)
nRBC: 0 % (ref 0.0–0.2)

## 2018-08-01 LAB — URINALYSIS, ROUTINE W REFLEX MICROSCOPIC
Bacteria, UA: NONE SEEN
Bilirubin Urine: NEGATIVE
Glucose, UA: >=500 mg/dL — AB
Hgb urine dipstick: NEGATIVE
Ketones, ur: 20 mg/dL — AB
Leukocytes,Ua: NEGATIVE
Nitrite: NEGATIVE
Protein, ur: 100 mg/dL — AB
Specific Gravity, Urine: 1.023 (ref 1.005–1.030)
pH: 5 (ref 5.0–8.0)

## 2018-08-01 LAB — BASIC METABOLIC PANEL WITH GFR
Anion gap: 10 (ref 5–15)
BUN: 14 mg/dL (ref 8–23)
CO2: 25 mmol/L (ref 22–32)
Calcium: 8.4 mg/dL — ABNORMAL LOW (ref 8.9–10.3)
Chloride: 95 mmol/L — ABNORMAL LOW (ref 98–111)
Creatinine, Ser: 1.47 mg/dL — ABNORMAL HIGH (ref 0.61–1.24)
GFR calc Af Amer: 54 mL/min — ABNORMAL LOW
GFR calc non Af Amer: 47 mL/min — ABNORMAL LOW
Glucose, Bld: 457 mg/dL — ABNORMAL HIGH (ref 70–99)
Potassium: 3.8 mmol/L (ref 3.5–5.1)
Sodium: 130 mmol/L — ABNORMAL LOW (ref 135–145)

## 2018-08-01 LAB — HEPATIC FUNCTION PANEL
ALT: 19 U/L (ref 0–44)
AST: 17 U/L (ref 15–41)
Albumin: 3.9 g/dL (ref 3.5–5.0)
Alkaline Phosphatase: 77 U/L (ref 38–126)
Bilirubin, Direct: 0.2 mg/dL (ref 0.0–0.2)
Indirect Bilirubin: 0.8 mg/dL (ref 0.3–0.9)
Total Bilirubin: 1 mg/dL (ref 0.3–1.2)
Total Protein: 7.1 g/dL (ref 6.5–8.1)

## 2018-08-01 LAB — CBG MONITORING, ED: Glucose-Capillary: 452 mg/dL — ABNORMAL HIGH (ref 70–99)

## 2018-08-01 LAB — LIPASE, BLOOD: Lipase: 34 U/L (ref 11–51)

## 2018-08-01 LAB — TROPONIN I
Troponin I: <0.03 ng/mL (ref <0.03)
Troponin I: <0.03 ng/mL (ref <0.03)

## 2018-08-01 LAB — D-DIMER, QUANTITATIVE: D-Dimer, Quant: <0.27 ug{FEU}/mL (ref 0.00–0.50)

## 2018-08-01 MED ORDER — SODIUM CHLORIDE 0.9 % IV BOLUS
500.0000 mL | Freq: Once | INTRAVENOUS | Status: AC
  Administered 2018-08-01: 500 mL via INTRAVENOUS

## 2018-08-01 MED ORDER — SODIUM CHLORIDE 0.9 % IV BOLUS
1000.0000 mL | Freq: Once | INTRAVENOUS | Status: AC
  Administered 2018-08-01: 20:00:00 1000 mL via INTRAVENOUS

## 2018-08-01 MED ORDER — ACETAMINOPHEN 325 MG PO TABS
650.0000 mg | ORAL_TABLET | Freq: Once | ORAL | Status: DC
  Filled 2018-08-01: qty 2

## 2018-08-01 MED ORDER — ASPIRIN 81 MG PO CHEW
324.0000 mg | CHEWABLE_TABLET | Freq: Once | ORAL | Status: AC
  Administered 2018-08-01: 19:00:00 324 mg via ORAL
  Filled 2018-08-01: qty 4

## 2018-08-01 NOTE — Assessment & Plan Note (Signed)
Needs further eval. Secondary to viral gastroenteritis vs other intraabdominal pathology, FBS well controlled so not obviously HONK/DKA.  need ER eval.

## 2018-08-01 NOTE — Assessment & Plan Note (Signed)
5 days of persistent diarrhea and emesis.[name] Kitchen likely dehydrated  Despite trying to keep up withpo intake.

## 2018-08-01 NOTE — Telephone Encounter (Signed)
Patient has virtual appt scheduled on 08/01/18 @ 1540 with Dr. Diona Browner.

## 2018-08-01 NOTE — Telephone Encounter (Signed)
Attempted to reach patient on mobile and home phone to discuss symptoms and schedule appointment with PCP. Both attempts unsuccessful.

## 2018-08-01 NOTE — Discharge Instructions (Addendum)
You are leaving [facility] at this time.  As we discussed I strongly advise you be admitted to the hospital for further evaluation of your chest pain as well as your high blood sugar.  Based on your risk factors and our evaluation today you are at high risk for a heart attack in the near future and we advised that you be admitted to the hospital tonight for further evaluation of your heart.  The risks of leaving [place] [facility] without a complete work-up at this time include but are not limited to worsening of your symptoms, increase in pain, disability and death.  Again I strongly advised that you remain at the hospital and be admitted for further work-up.  You may return to the emergency department at any time for further evaluation and treatment.  Please return immediately if your chest pain returns or you have any new or concerning symptoms.  I strongly recommend that you follow-up with your primary care provider as well as your cardiologist regarding your pain however these are not a replacement for admission at this time, the safest course of action would for you to be admitted tonight.  Please return as soon as possible to the emergency department for further evaluation.

## 2018-08-01 NOTE — Telephone Encounter (Signed)
Patient Name: [name] Gender: Male DOB: November 13, 1945 Age: 73 Y 62 M 23 D Return Phone Number: [phone] (Primary), [phone] (Secondary) Address: City/State/Zip: [place] [address] Client [facility] Night - Client Client Site [place] Physician Copland, Frederico Hamman - MD Contact Type Call Who Is Calling Patient / Member / Family / Caregiver Call Type Triage / Clinical Relationship To Patient Self Return Phone Number [phone] (Primary) Chief Complaint Headache Reason for Call Request to Schedule Office Appointment Initial Comment Caller states he would like to schedule an appt. Caller states he has headache for the past couple of days along with diarrhea and vomiting.Caller states he has urinated in the past 8 hours. Translation No Nurse Assessment Nurse: Jerene Bears, RN, Will Date/Time Eilene Ghazi Time): 07/31/2018 7:17:32 PM Confirm and document reason for call. If symptomatic, describe symptoms. ---Caller reports a headache throughout the weekend. Onset of vomiting and diarrhea Saturday evening. Vomiting stopped but patient still having diarrhea. No fever at any time. Pt is making urine and taking in PO fluids. Pt still has headache that is relieved by tylenol. Has the patient had close contact with a person known or suspected to have the novel coronavirus illness OR traveled / lives in area with major community spread (including international travel) in the last 14 days from the onset of symptoms? * If Asymptomatic, screen for exposure and travel within the last 14 days. ---No Does the patient have any new or worsening symptoms? ---Yes Will a triage be completed? ---Yes Related visit to physician within the last 2 weeks? ---No Does the PT have any chronic conditions? (i.e. diabetes, asthma, this includes High risk factors for pregnancy, etc.) ---Yes List chronic conditions. ---dm, gerd Is this a behavioral  health or substance abuse call? ---No Guidelines Guideline Title Affirmed Question Affirmed Notes Nurse Date/Time (Eastern Time) Headache [1] New headache AND [2] age > 62 Jerene Bears, RN, Will 07/31/2018 7:21:39 PM

## 2018-08-01 NOTE — Assessment & Plan Note (Signed)
Liekly dehydrated.. need stat eval of kideny function and likely IV fluids.[name] Kitchen go to ER.

## 2018-08-01 NOTE — ED Provider Notes (Signed)
[name] EMERGENCY DEPARTMENT Provider Note   CSN: [id number] Arrival date & time: 08/01/18  1735    History   Chief Complaint Chief Complaint  Patient presents with  . Chest Pain    HPI [name] is a 73 y.o. male with history of hypertension, diabetes, hyperlipidemia, CKD presenting today for 2 concerns.  Patient's primary concern today is chest pain that began approximately 3 PM he reports a left-sided sharp pain that waxes and wanes lasts for approximately 10 minutes at a time and without identifiable causative or alleviating factors.  Patient reports that he has had 3 episodes of pain today all of which occurred while he was sitting in his recliner.  Patient reports that he took Tylenol for his pain without relief.  Denies any aspirin use today.  Patient denies any pain at time of my initial evaluation.  Patient's second concern today is nausea vomiting and diarrhea that has been present for the past 3 days.  He describes nonbloody/nonbilious emesis that is intermittent and small amounts of nonbloody diarrhea.  He denies any abdominal pain with his vomiting or diarrhea, he denies any fever or recent sick contacts.  Patient denies any additional concerns today denies history of fever/cough or contact with known COVID-19 positive patients.     HPI  Past Medical History:  Diagnosis Date  . Allergic rhinitis due to pollen   . Chronic kidney disease, stage III (moderate) ([place]) 12/19/2012  . Depression   . Diabetes mellitus ([place])   . Diverticulosis   . Erectile dysfunction associated with type 2 diabetes mellitus ([place]) 05/28/2015  . Former very heavy cigarette smoker (more than 40 per day) 10/07/2014  . GERD (gastroesophageal reflux disease)   . Gout   . Hyperlipidemia   . Hypertension   . Hypogonadism male 06/14/2012  . Iron deficiency   . Memory loss   . Osteoarthritis   . Personal history of colonic polyps    1996    Patient Active Problem List    Diagnosis Date Noted  . Abdominal pain, vomiting, and diarrhea 08/01/2018  . Dehydration 08/01/2018  . Mild cognitive impairment 05/08/2018  . Alteration consciousness 05/08/2018  . Poor balance 02/23/2018  . Falls 10/14/2017  . Esophageal dysphagia   . Uncontrolled type 2 diabetes mellitus with peripheral neuropathy ([place]) 05/11/2016  . Erectile dysfunction associated with type 2 diabetes mellitus ([place]) 05/28/2015  . Former very heavy cigarette smoker (more than 40 per day) 10/07/2014  . Chronic kidney disease, stage III (moderate) ([place]) 12/19/2012  . Hypogonadism male 06/14/2012  . Chest pain 11/11/2011  . Obesity (BMI 30-39.9) 09/27/2011  . OSA (obstructive sleep apnea) 03/09/2011  . Diabetic neuropathy ([place]) 12/29/2010  . Family history of MI (myocardial infarction) 09/16/2010  . HIP REPLACEMENT, RIGHT, HX OF 02/05/2010  . Hypertriglyceridemia 06/13/2008  . GOUT 06/13/2008  . Major depressive disorder, recurrent episode, in partial remission ([place]) 06/13/2008  . Essential hypertension 06/13/2008  . ALLERGIC RHINITIS 06/13/2008  . GERD 06/13/2008  . OSTEOARTHRITIS 06/13/2008    Past Surgical History:  Procedure Laterality Date  . CARDIAC CATHETERIZATION  11/2011   [facility]  . CHOLECYSTECTOMY  1980  . ESOPHAGOGASTRODUODENOSCOPY (EGD) WITH PROPOFOL N/A 09/27/2017   Procedure: ESOPHAGOGASTRODUODENOSCOPY (EGD) WITH PROPOFOL;  Surgeon: Lin Landsman, MD;  Location: [facility];  Service: Gastroenterology;  Laterality: N/A;  . EYE SURGERY Right 07/29/2016   [facility] - Dr. Manuella Ghazi  . EYE SURGERY Left 08/23/2106   cataract - Dr. Manuella Ghazi  .  TOTAL HIP ARTHROPLASTY  2009   Dr. Gladstone Lighter  . TOTAL HIP ARTHROPLASTY        Home Medications    Prior to Admission medications   Medication Sig Start Date End Date Taking? Authorizing Provider  ampicillin (PRINCIPEN) 500 MG capsule TAKE 1 CAPSULE DAILY WITH FOOD ORALLY 30 DAYS 06/13/18   [provider]  Ascorbic Acid (VITAMIN C)  1000 MG tablet Take 1,000 mg by mouth daily.    [provider]  aspirin EC 81 MG tablet Take 81 mg by mouth at bedtime.    [provider]  atenolol (TENORMIN) 50 MG tablet Take 1 tablet (50 mg total) by mouth daily. 05/18/18   Copland, Frederico Hamman, MD  buPROPion (WELLBUTRIN SR) 150 MG 12 hr tablet TAKE 1 TABLET BY MOUTH TWICE A DAY 06/07/17   Copland, Frederico Hamman, MD  cholecalciferol (VITAMIN D) 1000 UNITS tablet Take 1,000 Units by mouth daily.      [provider]  colchicine 0.6 MG tablet Take 1 tablet (0.6 mg total) by mouth 2 (two) times daily. 07/12/18   Copland, Frederico Hamman, MD  donepezil (ARICEPT) 10 MG tablet Take 1 tablet (10 mg total) by mouth at bedtime. 05/18/18   Copland, Frederico Hamman, MD  doxazosin (CARDURA) 8 MG tablet Take 0.5 tablets (4 mg total) by mouth at bedtime. 05/18/18   Copland, Frederico Hamman, MD  finasteride (PROSCAR) 5 MG tablet Take 1 tablet (5 mg total) by mouth daily. 01/16/18   Copland, Frederico Hamman, MD  fluticasone (FLONASE) 50 MCG/ACT nasal spray INHALE 2 SPRAYS INTO EACH NOSTRIL EVERY DAY AS NEEDED FOR CONGESTION 05/18/18   Copland, Frederico Hamman, MD  gabapentin (NEURONTIN) 300 MG capsule Take 2 capsules (600 mg total) by mouth at bedtime. 07/12/18   Copland, Frederico Hamman, MD  ibuprofen (ADVIL,MOTRIN) 200 MG tablet Take 200-400 mg by mouth every 6 (six) hours as needed for headache (pain).    [provider]  indomethacin (INDOCIN) 50 MG capsule Take 1 capsule (50 mg total) by mouth 2 (two) times daily with a meal. 07/12/18   Copland, Frederico Hamman, MD  insulin NPH Human (HUMULIN N,NOVOLIN N) 100 UNIT/ML injection Inject 0.25 mLs (25 Units total) into the skin 2 (two) times daily before a meal. 10/14/17   Ria Bush, MD  insulin regular (NOVOLIN R,HUMULIN R) 100 units/mL injection Inject 0.2 mLs (20 Units total) into the skin 2 (two) times daily before a meal. 10/14/17   Ria Bush, MD  lisinopril (PRINIVIL,ZESTRIL) 10 MG tablet Take 1 tablet (10 mg total) by mouth daily. 05/22/18    Copland, Frederico Hamman, MD  memantine (NAMENDA) 10 MG tablet Take 1 tablet (10 mg total) by mouth 2 (two) times daily. 05/18/18   Copland, Frederico Hamman, MD  omeprazole (PRILOSEC) 40 MG capsule TAKE 1 CAPSULE (40 MG TOTAL) BY MOUTH 2 (TWO) TIMES DAILY BEFORE A MEAL. 03/22/18 08/01/18  Lin Landsman, MD  Semaglutide,0.25 or 0.5MG /DOS, (OZEMPIC, 0.25 OR 0.5 MG/DOSE,) 2 MG/1.5ML SOPN Inject 0.5 mg into the skin once a week. 05/18/18   Philemon Kingdom, MD  sildenafil (REVATIO) 20 MG tablet Generic Revatio / Sildanefil 20 mg. 2 - 5 tabs 30 mins prior to intercourse. 04/13/18   Copland, Frederico Hamman, MD  simvastatin (ZOCOR) 40 MG tablet Take 1 tablet (40 mg total) by mouth at bedtime. 05/18/18   Copland, Frederico Hamman, MD  testosterone cypionate (DEPOTESTOSTERONE CYPIONATE) 200 MG/ML injection Inject 150 mg every 14 days 05/18/18   Copland, Frederico Hamman, MD  venlafaxine XR (EFFEXOR-XR) 75 MG 24 hr capsule TAKE ONE CAPSULE (  75 MG) BY MOUTH EVERY MORNING AND TWO CAPSULES (150 MG) AT NIGHT 05/18/18   Copland, Frederico Hamman, MD    Family History Family History  Problem Relation Age of Onset  . Diabetes Mother   . Alzheimer's disease Mother   . Diabetes Father   . Lung cancer Father        Age 58  . Stroke Father   . Heart attack Father   . Lung cancer Sister        lung  . Heart disease Maternal Grandfather   . COPD Sister   . Heart attack Sister   . Heart disease Sister     Social History Social History   Tobacco Use  . Smoking status: Former Smoker    Packs/day: 3.00    Years: 35.00    Pack years: 105.00    Types: Cigarettes    Last attempt to quit: 04/12/2000    Years since quitting: 18.3  . Smokeless tobacco: Never Used  Substance Use Topics  . Alcohol use: No  . Drug use: No     Allergies   Tetracycline   Review of Systems Review of Systems  Constitutional: Negative.  Negative for chills and fever.  Respiratory: Negative.  Negative for cough and shortness of breath.   Cardiovascular: Positive for chest  pain.  Gastrointestinal: Positive for diarrhea, nausea and vomiting. Negative for abdominal pain.  Musculoskeletal: Negative.  Negative for back pain and neck pain.  Neurological: Negative.  Negative for dizziness, weakness, numbness and headaches.  All other systems reviewed and are negative.  Physical Exam Updated Vital Signs BP (!) 167/79   Pulse 84   Temp 99 F (37.2 C) (Oral)   Resp 18   Ht 5\' 11"  (1.803 m)   Wt 93 kg   SpO2 98%   BMI 28.59 kg/m   Physical Exam Constitutional:      General: He is not in acute distress.    Appearance: Normal appearance. He is well-developed. He is not ill-appearing or diaphoretic.  HENT:     Head: Normocephalic and atraumatic.     Right Ear: External ear normal.     Left Ear: External ear normal.     Nose: Nose normal.  Eyes:     General: Vision grossly intact. Gaze aligned appropriately.     Pupils: Pupils are equal, round, and reactive to light.  Neck:     Musculoskeletal: Normal range of motion and neck supple.     Trachea: Trachea and phonation normal. No tracheal deviation.  Cardiovascular:     Rate and Rhythm: Normal rate and regular rhythm.     Pulses:          Radial pulses are 2+ on the right side and 2+ on the left side.       Dorsalis pedis pulses are 2+ on the right side and 2+ on the left side.       Posterior tibial pulses are 2+ on the right side and 2+ on the left side.     Heart sounds: Normal heart sounds.  Pulmonary:     Effort: Pulmonary effort is normal. No accessory muscle usage or respiratory distress.     Breath sounds: Normal breath sounds.  Chest:     Chest wall: No deformity, tenderness or crepitus.  Abdominal:     General: There is no distension.     Palpations: Abdomen is soft.     Tenderness: There is no abdominal tenderness. There is no guarding  or rebound.  Musculoskeletal: Normal range of motion.     Right lower leg: He exhibits no tenderness. No edema.     Left lower leg: He exhibits no  tenderness. No edema.     Comments: Varicose veins present bilaterally  Skin:    General: Skin is warm and dry.  Neurological:     Mental Status: He is alert.     GCS: GCS eye subscore is 4. GCS verbal subscore is 5. GCS motor subscore is 6.     Comments: Speech is clear and goal oriented, follows commands Major Cranial nerves without deficit, no facial droop Moves extremities without ataxia, coordination intact  Psychiatric:        Mood and Affect: Mood normal.        Behavior: Behavior normal.    ED Treatments / Results  Labs (all labs ordered are listed, but only abnormal results are displayed) Labs Reviewed  BASIC METABOLIC PANEL - Abnormal; Notable for the following components:      Result Value   Sodium 128 (*)    Chloride 87 (*)    Glucose, Bld 478 (*)    Creatinine, Ser 1.55 (*)    GFR calc non Af Amer 44 (*)    GFR calc Af Amer 51 (*)    Anion gap 16 (*)    All other components within normal limits  URINALYSIS, ROUTINE W REFLEX MICROSCOPIC - Abnormal; Notable for the following components:   Glucose, UA >=500 (*)    Ketones, ur 20 (*)    Protein, ur 100 (*)    All other components within normal limits  BASIC METABOLIC PANEL - Abnormal; Notable for the following components:   Sodium 130 (*)    Chloride 95 (*)    Glucose, Bld 457 (*)    Creatinine, Ser 1.47 (*)    Calcium 8.4 (*)    GFR calc non Af Amer 47 (*)    GFR calc Af Amer 54 (*)    All other components within normal limits  CBG MONITORING, ED - Abnormal; Notable for the following components:   Glucose-Capillary 452 (*)    All other components within normal limits  TROPONIN I  CBC WITH DIFFERENTIAL/PLATELET  LIPASE, BLOOD  HEPATIC FUNCTION PANEL  D-DIMER, QUANTITATIVE (NOT AT [facility])  TROPONIN I    EKG EKG Interpretation  Date/Time:  Tuesday August 01 2018 17:54:11 EDT Ventricular Rate:  87 PR Interval:    QRS Duration: 93 QT Interval:  362 QTC Calculation: 436 R Axis:   47 Text  Interpretation:  Sinus rhythm Minimal ST elevation, anterior leads similar to previous Confirmed by Theotis Burrow [phone]) on 08/01/2018 6:02:48 PM   Radiology Dg Chest Port 1 View  Result Date: 08/01/2018 CLINICAL DATA:  73 year old male with history of left-sided chest pain today occurring 3-4 times. Former smoker. EXAM: PORTABLE CHEST 1 VIEW COMPARISON:  Chest x-ray 09/20/2016. FINDINGS: Lung volumes are normal. No consolidative airspace disease. No pleural effusions. No pneumothorax. No pulmonary nodule or mass noted. Pulmonary vasculature and the cardiomediastinal silhouette are within normal limits. IMPRESSION: No radiographic evidence of acute cardiopulmonary disease. Electronically Signed   By: Vinnie Langton M.D.   On: 08/01/2018 18:11    Procedures Procedures (including critical care time)  Medications Ordered in ED Medications  acetaminophen (TYLENOL) tablet 650 mg (has no administration in time range)  aspirin chewable tablet 324 mg (324 mg Oral Given 08/01/18 1851)  sodium chloride 0.9 % bolus 500 mL (0 mLs Intravenous  Stopped 08/01/18 2147)  sodium chloride 0.9 % bolus 1,000 mL (0 mLs Intravenous Stopped 08/01/18 2148)     Initial Impression / Assessment and Plan / ED Course  I have reviewed the triage vital signs and the nursing notes.  Pertinent labs & imaging results that were available during my care of the patient were reviewed by me and considered in my medical decision making (see chart for details).    Initial troponin negative BMP with elevated creatinine, glucose and anion gap of 16; sodium 128, chloride of 87 LFTs within normal limit CBC within normal limits D-dimer negative Urinalysis with ketones glucose Chest x-ray:  IMPRESSION:  No radiographic evidence of acute cardiopulmonary disease.  EKG without acute changes reviewed with Dr. Rex Kras ------------------- Patient seen and evaluated with Dr. Rex Kras.  Suspect patient with anion gap secondary to  dehydration do not suspect DKA at this time.  Plan at this time is to rehydrate the patient, reassess BMP and troponin.  Patient with heart score of 5 at this time.  At this time patient is adamant about being discharged and has refused admission.  Patient is agreeable to repeat lab work. - BMP improved sodium 130, gap resolved, chloride 95, creatinine improved to 1.47, glucose still elevated at 457 Repeat troponin negative - Do not suspect DKA at this time, appears to be improving with fluid hydration.  Patient reports that his insulin at home and has refused IV insulin here in the emergency department.   Patient has chosen to leave [place] at this time.  I advised patient of his heart score and metabolic abnormalities and that I am concerned that he will have a negative outcome if he leaves at this time.  Patient is fully alert and oriented and appears to have the mental capacity to make his own medical decisions and he has chosen to leave [facility].  I discussed the risks of leaving [facility] including worsening of symptoms, pain, disability and death.  Patient states understanding of the risks and still chooses to leave AMA.  He reports that he has insulin and all of his home medicines at home and does not need refills.  Patient states understanding that he may return to the emergency department at any time for further evaluation and treatment.  Additionally patient reports that he has a primary care provider as well as a cardiologist that he follows up with, I have encouraged him to do so however I did advise him that this is a replacement for admission and I still strongly advised him to be admitted to the hospital tonight. I have discussed return precautions with patient who verbalizes understanding of return precautions. All questions answered.  Patient was seen and evaluated by Dr. Rex Kras during this visit.  Note: Portions of this report may have been  transcribed using voice recognition software. Every effort was made to ensure accuracy; however, inadvertent computerized transcription errors may still be present. Final Clinical Impressions(s) / ED Diagnoses   Final diagnoses:  Chest pain, unspecified type  Hyperglycemia    ED Discharge Orders    None       Gari Crown 08/01/18 2310    Little, Wenda Overland, MD 08/06/18 1045

## 2018-08-01 NOTE — ED Triage Notes (Signed)
Pt c/o chest pain today last minutes x3-4 times todaY. nONE NOW.

## 2018-08-01 NOTE — Progress Notes (Signed)
VIRTUAL VISIT Due to national recommendations of social distancing due to [place] 19, a virtual visit is felt to be most appropriate for this patient at this time.   I connected with the patient on 08/01/18 at  3:40 PM EDT by virtual telehealth platform and verified that I am speaking with the correct person using two identifiers.   I discussed the limitations, risks, security and privacy concerns of performing an evaluation and management service by  virtual telehealth platform and the availability of in person appointments. I also discussed with the patient that there may be a patient responsible charge related to this service. The patient expressed understanding and agreed to proceed.  Patient location: Home Provider Location: [facility] [place] Participants: Eliezer Lofts and [name]   Chief Complaint  Patient presents with  . Headache    Denies any falls or neurological changes  . Diarrhea  . Emesis    History of Present Illness: 73 year old male pt of Dr. Lillie Fragmin with history of  Mild cognitive impairment, poorly controlled DM, GERD, CKD, HTN presents with recent emesis and diarrhea, followed by persistent headache.  He reports 5 days ago  4/17 Starting with decreased energy, loose stool.  Started feeling nausea.[name] Kitchen emesis x several times that night.  No  blood in emesis or BM.  Pushed gatorade and pedialyte. Continued to have  BM and ememis 3-4 times daily in last several days.  He rports first thing " I have never felt this sick before!"  Now  Diarrhea off and on. Last emesis was this AM. Has kept down some food and fluids over the day today.  Intermittant abdominal pain.  Nml UOP. He has had mild intermittant headache in last several days.[name] Kitchen tylenol helps.  No fever.   45 min ago pain in chest. Needle like feeling in left chest, at rest.,. lasted 10 min. No radiation of pain, lasted 10 min.  no SOB, no sweating   He is feeling tired.   blood sugars has 104.. better  than uusual   No recent falls, no new neuro changes.  BP Readings from Last 3 Encounters:  08/01/18 (!) 167/99  07/12/18 (!) 144/70  06/21/18 (!) 84/44     Lab Results  Component Value Date   HGBA1C 12.5 (H) 04/13/2018     COVID 19 screen No recent travel or known exposure to [place] The patient denies respiratory symptoms of COVID 19 at this time.  The importance of social distancing was discussed today.   Review of Systems  Constitutional: Positive for malaise/fatigue. Negative for fever.  HENT: Negative for congestion.       Past Medical History:  Diagnosis Date  . Allergic rhinitis due to pollen   . Chronic kidney disease, stage III (moderate) ([place]) 12/19/2012  . Depression   . Diabetes mellitus ([place])   . Diverticulosis   . Erectile dysfunction associated with type 2 diabetes mellitus ([place]) 05/28/2015  . Former very heavy cigarette smoker (more than 40 per day) 10/07/2014  . GERD (gastroesophageal reflux disease)   . Gout   . Hyperlipidemia   . Hypertension   . Hypogonadism male 06/14/2012  . Iron deficiency   . Memory loss   . Osteoarthritis   . Personal history of colonic polyps    1996    reports that he quit smoking about 18 years ago. His smoking use included cigarettes. He has a 105.00 pack-year smoking history. He has never used smokeless tobacco. He reports that he  does not drink alcohol or use drugs.   Current Outpatient Medications:  .  ampicillin (PRINCIPEN) 500 MG capsule, TAKE 1 CAPSULE DAILY WITH FOOD ORALLY 30 DAYS, Disp: , Rfl:  .  Ascorbic Acid (VITAMIN C) 1000 MG tablet, Take 1,000 mg by mouth daily., Disp: , Rfl:  .  aspirin EC 81 MG tablet, Take 81 mg by mouth at bedtime., Disp: , Rfl:  .  atenolol (TENORMIN) 50 MG tablet, Take 1 tablet (50 mg total) by mouth daily., Disp: 90 tablet, Rfl: 3 .  buPROPion (WELLBUTRIN SR) 150 MG 12 hr tablet, TAKE 1 TABLET BY MOUTH TWICE A DAY, Disp: 180 tablet, Rfl: 1 .  cholecalciferol (VITAMIN D) 1000 UNITS  tablet, Take 1,000 Units by mouth daily.  , Disp: , Rfl:  .  colchicine 0.6 MG tablet, Take 1 tablet (0.6 mg total) by mouth 2 (two) times daily., Disp: 60 tablet, Rfl: 2 .  donepezil (ARICEPT) 10 MG tablet, Take 1 tablet (10 mg total) by mouth at bedtime., Disp: 90 tablet, Rfl: 3 .  doxazosin (CARDURA) 8 MG tablet, Take 0.5 tablets (4 mg total) by mouth at bedtime., Disp: 45 tablet, Rfl: 3 .  finasteride (PROSCAR) 5 MG tablet, Take 1 tablet (5 mg total) by mouth daily., Disp: 30 tablet, Rfl: 2 .  fluticasone (FLONASE) 50 MCG/ACT nasal spray, INHALE 2 SPRAYS INTO EACH NOSTRIL EVERY DAY AS NEEDED FOR CONGESTION, Disp: 48 g, Rfl: 3 .  gabapentin (NEURONTIN) 300 MG capsule, Take 2 capsules (600 mg total) by mouth at bedtime., Disp: 90 capsule, Rfl: 1 .  ibuprofen (ADVIL,MOTRIN) 200 MG tablet, Take 200-400 mg by mouth every 6 (six) hours as needed for headache (pain)., Disp: , Rfl:  .  indomethacin (INDOCIN) 50 MG capsule, Take 1 capsule (50 mg total) by mouth 2 (two) times daily with a meal., Disp: 30 capsule, Rfl: 0 .  insulin NPH Human (HUMULIN N,NOVOLIN N) 100 UNIT/ML injection, Inject 0.25 mLs (25 Units total) into the skin 2 (two) times daily before a meal., Disp: 10 mL, Rfl:  .  insulin regular (NOVOLIN R,HUMULIN R) 100 units/mL injection, Inject 0.2 mLs (20 Units total) into the skin 2 (two) times daily before a meal., Disp: 10 mL, Rfl:  .  lisinopril (PRINIVIL,ZESTRIL) 10 MG tablet, Take 1 tablet (10 mg total) by mouth daily., Disp: 90 tablet, Rfl: 1 .  memantine (NAMENDA) 10 MG tablet, Take 1 tablet (10 mg total) by mouth 2 (two) times daily., Disp: 180 tablet, Rfl: 3 .  Semaglutide,0.25 or 0.5MG /DOS, (OZEMPIC, 0.25 OR 0.5 MG/DOSE,) 2 MG/1.5ML SOPN, Inject 0.5 mg into the skin once a week., Disp: 2 pen, Rfl: 5 .  sildenafil (REVATIO) 20 MG tablet, Generic Revatio / Sildanefil 20 mg. 2 - 5 tabs 30 mins prior to intercourse., Disp: 10 tablet, Rfl: 11 .  simvastatin (ZOCOR) 40 MG tablet, Take 1  tablet (40 mg total) by mouth at bedtime., Disp: 90 tablet, Rfl: 3 .  testosterone cypionate (DEPOTESTOSTERONE CYPIONATE) 200 MG/ML injection, Inject 150 mg every 14 days, Disp: 10 mL, Rfl: 1 .  venlafaxine XR (EFFEXOR-XR) 75 MG 24 hr capsule, TAKE ONE CAPSULE (75 MG) BY MOUTH EVERY MORNING AND TWO CAPSULES (150 MG) AT NIGHT, Disp: 270 capsule, Rfl: 1 .  omeprazole (PRILOSEC) 40 MG capsule, TAKE 1 CAPSULE (40 MG TOTAL) BY MOUTH 2 (TWO) TIMES DAILY BEFORE A MEAL., Disp: 60 capsule, Rfl: 1  Current Facility-Administered Medications:  .  testosterone cypionate (DEPOTESTOSTERONE CYPIONATE) injection 150 mg, 150  mg, Intramuscular, Q14 Days, Copland, Spencer, MD, 150 mg at 04/19/18 1128 .  testosterone cypionate (DEPOTESTOSTERONE CYPIONATE) injection 150 mg, 150 mg, Intramuscular, Q14 Days, Copland, Spencer, MD, 150 mg at 05/31/18 1415   Observations/Objective: Blood pressure (!) 167/99, pulse (!) 110, temperature 97.8 F (36.6 C), temperature source Tympanic, height 5\' 11"  (1.803 m).  Physical Exam  Physical Exam Constitutional:      General: She is not in acute distress. Pulmonary:     Effort: Pulmonary effort is normal. No respiratory distress.  Neurological:     Mental Status: She is alert and oriented to person, place, and time.  Psychiatric:        Mood and Affect: Mood normal.        Behavior: Behavior normal.   Assessment and Plan Chest pain Pt very high risk for CAD. Recommended  ER eval to rule out cardiac injury with tropnins and EKG.  Chronic kidney disease, stage III (moderate) Liekly dehydrated.. need stat eval of kideny function and likely IV fluids.[name] Kitchen go to ER.  Essential hypertension Poor control.  Uncontrolled type 2 diabetes mellitus with peripheral neuropathy (HCC) Poor control previously, but fairly good control  In last few days with limited po intake.  Dehydration  5 days of persistent diarrhea and emesis.[name] Kitchen likely dehydrated  Despite trying to keep up withpo  intake.  Abdominal pain, vomiting, and diarrhea Needs further eval. Secondary to viral gastroenteritis vs other intraabdominal pathology, FBS well controlled so not obviously HONK/DKA.  need ER eval.     Pt agreeable to STAT ER eval. His grandson or son will take him to [facility] ER.   I discussed the assessment and treatment plan with the patient. The patient was provided an opportunity to ask questions and all were answered. The patient agreed with the plan and demonstrated an understanding of the instructions.   The patient was advised to call back or seek an in-person evaluation if the symptoms worsen or if the condition fails to improve as anticipated.     Eliezer Lofts, MD

## 2018-08-01 NOTE — Assessment & Plan Note (Addendum)
Pt very high risk for CAD. Recommended  ER eval to rule out cardiac injury with tropnins and EKG.

## 2018-08-01 NOTE — Assessment & Plan Note (Signed)
Poor control

## 2018-08-01 NOTE — Assessment & Plan Note (Signed)
Poor control previously, but fairly good control  In last few days with limited po intake.

## 2018-08-02 ENCOUNTER — Telehealth: Payer: Self-pay | Admitting: *Deleted

## 2018-08-02 MED ORDER — PERMETHRIN 5 % EX CREA: TOPICAL_CREAM | CUTANEOUS | 0 refills | Status: DC

## 2018-08-02 NOTE — Telephone Encounter (Signed)
Patient stated that he went to the ER yesterday with chest pain. Patient stated while he was there the doctor told him it looked like he had scabies on his forehead. Patient stated that they did not offer any treatment for the scabies. Patient stated that he has had the scabies on his forehead for about a week and wants to know if his doctor  will send a script to the pharmacy to treat the scabies? Pharmacy [facility]

## 2018-08-02 NOTE — Telephone Encounter (Signed)
He will need to decontaminate all bedding, clothing, towels using high heat cycle.  Remove other couches, etc, from all bodily contact for at least 72 hours  Examine close family contacts

## 2018-08-02 NOTE — Telephone Encounter (Signed)
Left message for [name] to return my call.

## 2018-08-02 NOTE — Telephone Encounter (Signed)
Patient said he is returning a call from our office.

## 2018-08-03 ENCOUNTER — Telehealth: Payer: Self-pay

## 2018-08-03 ENCOUNTER — Other Ambulatory Visit: Payer: Self-pay | Admitting: Family Medicine

## 2018-08-03 NOTE — Telephone Encounter (Signed)
Patient reports there is a painful, raised area under left eyelid. Feels it may be related to scabies. Patient wants know if there is anything at home he can do to resolve this issue? Please advise.

## 2018-08-03 NOTE — Telephone Encounter (Signed)
Sorry that note was started after my note. Thank you for notifying pt.

## 2018-08-03 NOTE — Telephone Encounter (Signed)
Mr. Spradley notified as instructed by telephone.  Patient states understanding.

## 2018-08-03 NOTE — Telephone Encounter (Signed)
Do you [name] to stay on colchicine?

## 2018-08-03 NOTE — Telephone Encounter (Signed)
I honestly have no idea how to answer this question.  I don't see how it could relate to scabies.   Theoretically it could be a stye, but there is no way for me to know over the phone.  Warm compresses would likely not be harmful in any case.

## 2018-08-03 NOTE — Telephone Encounter (Signed)
[name] notified as instructed by telephone.

## 2018-08-03 NOTE — Telephone Encounter (Signed)
Pt left v/m that he had medical question; I left v/m requesting pt to call [facility].

## 2018-08-03 NOTE — Telephone Encounter (Signed)
Patient seen in ED for chest pain. Left AMA. Contacted patient to schedule ED f/u appt. Office appt scheduled 08/07/18 @ 1140 per PCP instruction.  Patient denies chest pain at this time. Encouraged to seek treatment at ED if there is a change in health status. Patient verbalized understanding.

## 2018-08-03 NOTE — Telephone Encounter (Signed)
See 2nd phone note started today.

## 2018-08-04 ENCOUNTER — Emergency Department (HOSPITAL_COMMUNITY)
Admission: EM | Admit: 2018-08-04 | Discharge: 2018-08-04 | Disposition: A | Discharge status: Home / Self Care | Payer: Medicare Other | Attending: Emergency Medicine | Admitting: Emergency Medicine

## 2018-08-04 ENCOUNTER — Encounter (HOSPITAL_COMMUNITY): Payer: Self-pay

## 2018-08-04 ENCOUNTER — Other Ambulatory Visit: Payer: Self-pay

## 2018-08-04 ENCOUNTER — Ambulatory Visit: Payer: Medicare Other | Admitting: Internal Medicine

## 2018-08-04 DIAGNOSIS — Z79899 Other long term (current) drug therapy: Secondary | ICD-10-CM | POA: Diagnosis not present

## 2018-08-04 DIAGNOSIS — N183 Chronic kidney disease, stage 3 (moderate): Secondary | ICD-10-CM | POA: Insufficient documentation

## 2018-08-04 DIAGNOSIS — I129 Hypertensive chronic kidney disease with stage 1 through stage 4 chronic kidney disease, or unspecified chronic kidney disease: Secondary | ICD-10-CM | POA: Insufficient documentation

## 2018-08-04 DIAGNOSIS — N179 Acute kidney failure, unspecified: Secondary | ICD-10-CM | POA: Diagnosis not present

## 2018-08-04 DIAGNOSIS — R197 Diarrhea, unspecified: Secondary | ICD-10-CM | POA: Diagnosis not present

## 2018-08-04 DIAGNOSIS — Z7982 Long term (current) use of aspirin: Secondary | ICD-10-CM | POA: Diagnosis not present

## 2018-08-04 DIAGNOSIS — Z87891 Personal history of nicotine dependence: Secondary | ICD-10-CM | POA: Insufficient documentation

## 2018-08-04 DIAGNOSIS — Z794 Long term (current) use of insulin: Secondary | ICD-10-CM | POA: Insufficient documentation

## 2018-08-04 DIAGNOSIS — Z5329 Procedure and treatment not carried out because of patient's decision for other reasons: Secondary | ICD-10-CM | POA: Diagnosis not present

## 2018-08-04 DIAGNOSIS — R112 Nausea with vomiting, unspecified: Secondary | ICD-10-CM | POA: Diagnosis not present

## 2018-08-04 DIAGNOSIS — E1122 Type 2 diabetes mellitus with diabetic chronic kidney disease: Secondary | ICD-10-CM | POA: Diagnosis not present

## 2018-08-04 DIAGNOSIS — Z96641 Presence of right artificial hip joint: Secondary | ICD-10-CM | POA: Diagnosis not present

## 2018-08-04 DIAGNOSIS — B029 Zoster without complications: Secondary | ICD-10-CM | POA: Insufficient documentation

## 2018-08-04 DIAGNOSIS — H02846 Edema of left eye, unspecified eyelid: Secondary | ICD-10-CM | POA: Diagnosis present

## 2018-08-04 LAB — URINALYSIS, ROUTINE W REFLEX MICROSCOPIC
Bacteria, UA: NONE SEEN
Bilirubin Urine: NEGATIVE
Glucose, UA: >=500 mg/dL — AB
Ketones, ur: 80 mg/dL — AB
Leukocytes,Ua: NEGATIVE
Nitrite: NEGATIVE
Protein, ur: >=300 mg/dL — AB
Specific Gravity, Urine: 1.025 (ref 1.005–1.030)
pH: 5 (ref 5.0–8.0)

## 2018-08-04 LAB — CBC WITH DIFFERENTIAL/PLATELET
Abs Immature Granulocytes: 0.06 10*3/uL (ref 0.00–0.07)
Basophils Absolute: 0 10*3/uL (ref 0.0–0.1)
Basophils Relative: 0 %
Eosinophils Absolute: 0 10*3/uL (ref 0.0–0.5)
Eosinophils Relative: 0 %
HCT: 50.8 % (ref 39.0–52.0)
Hemoglobin: 17.6 g/dL — ABNORMAL HIGH (ref 13.0–17.0)
Immature Granulocytes: 1 %
Lymphocytes Relative: 13 %
Lymphs Abs: 1 10*3/uL (ref 0.7–4.0)
MCH: 30 pg (ref 26.0–34.0)
MCHC: 34.6 g/dL (ref 30.0–36.0)
MCV: 86.5 fL (ref 80.0–100.0)
Monocytes Absolute: 0.6 10*3/uL (ref 0.1–1.0)
Monocytes Relative: 8 %
Neutro Abs: 6 10*3/uL (ref 1.7–7.7)
Neutrophils Relative %: 78 %
Platelets: 199 10*3/uL (ref 150–400)
RBC: 5.87 MIL/uL — ABNORMAL HIGH (ref 4.22–5.81)
RDW: 12.1 % (ref 11.5–15.5)
WBC: 7.8 10*3/uL (ref 4.0–10.5)
nRBC: 0 % (ref 0.0–0.2)

## 2018-08-04 LAB — POCT I-STAT EG7
Acid-base deficit: 11 mmol/L — ABNORMAL HIGH (ref 0.0–2.0)
Bicarbonate: 16.7 mmol/L — ABNORMAL LOW (ref 20.0–28.0)
Calcium, Ion: 1.13 mmol/L — ABNORMAL LOW (ref 1.15–1.40)
HCT: 53 % — ABNORMAL HIGH (ref 39.0–52.0)
Hemoglobin: 18 g/dL — ABNORMAL HIGH (ref 13.0–17.0)
O2 Saturation: 63 %
Potassium: 5.1 mmol/L (ref 3.5–5.1)
Sodium: 125 mmol/L — ABNORMAL LOW (ref 135–145)
TCO2: 18 mmol/L — ABNORMAL LOW (ref 22–32)
pCO2, Ven: 43.1 mmHg — ABNORMAL LOW (ref 44.0–60.0)
pH, Ven: 7.197 — CL (ref 7.250–7.430)
pO2, Ven: 40 mmHg (ref 32.0–45.0)

## 2018-08-04 LAB — BASIC METABOLIC PANEL
Anion gap: 27 — ABNORMAL HIGH (ref 5–15)
BUN: 20 mg/dL (ref 8–23)
CO2: 14 mmol/L — ABNORMAL LOW (ref 22–32)
Calcium: 9.3 mg/dL (ref 8.9–10.3)
Chloride: 86 mmol/L — ABNORMAL LOW (ref 98–111)
Creatinine, Ser: 1.99 mg/dL — ABNORMAL HIGH (ref 0.61–1.24)
GFR calc Af Amer: 38 mL/min — ABNORMAL LOW (ref >60)
GFR calc non Af Amer: 33 mL/min — ABNORMAL LOW (ref >60)
Glucose, Bld: 468 mg/dL — ABNORMAL HIGH (ref 70–99)
Potassium: 4.9 mmol/L (ref 3.5–5.1)
Sodium: 127 mmol/L — ABNORMAL LOW (ref 135–145)

## 2018-08-04 MED ORDER — ERYTHROMYCIN 5 MG/GM OP OINT: TOPICAL_OINTMENT | OPHTHALMIC | 0 refills | Status: DC

## 2018-08-04 MED ORDER — ONDANSETRON HCL 4 MG PO TABS: 4.0000 mg | ORAL_TABLET | Freq: Three times a day (TID) | ORAL | 0 refills | Status: DC | PRN

## 2018-08-04 MED ORDER — ONDANSETRON 4 MG PO TBDP
4.0000 mg | ORAL_TABLET | Freq: Once | ORAL | Status: AC
  Administered 2018-08-04: 13:00:00 4 mg via ORAL
  Filled 2018-08-04: qty 1

## 2018-08-04 MED ORDER — INSULIN ASPART 100 UNIT/ML ~~LOC~~ SOLN: 5.0000 [IU] | Freq: Once | SUBCUTANEOUS | Status: DC

## 2018-08-04 MED ORDER — INSULIN ASPART 100 UNIT/ML ~~LOC~~ SOLN
10.0000 [IU] | Freq: Once | SUBCUTANEOUS | Status: AC
  Administered 2018-08-04: 15:00:00 10 [IU] via INTRAVENOUS

## 2018-08-04 MED ORDER — SODIUM CHLORIDE 0.9 % IV BOLUS: 1000.0000 mL | Freq: Once | INTRAVENOUS | Status: DC

## 2018-08-04 MED ORDER — INSULIN ASPART 100 UNIT/ML ~~LOC~~ SOLN: 10.0000 [IU] | Freq: Once | SUBCUTANEOUS | Status: DC

## 2018-08-04 MED ORDER — SODIUM CHLORIDE 0.9 % IV BOLUS (SEPSIS)
1000.0000 mL | Freq: Once | INTRAVENOUS | Status: AC
  Administered 2018-08-04: 1000 mL via INTRAVENOUS

## 2018-08-04 MED ORDER — ERYTHROMYCIN 5 MG/GM OP OINT
1.0000 "application " | TOPICAL_OINTMENT | Freq: Once | OPHTHALMIC | Status: AC
  Administered 2018-08-04: 1 via OPHTHALMIC
  Filled 2018-08-04: qty 3.5

## 2018-08-04 MED ORDER — ACYCLOVIR 200 MG PO CAPS
800.0000 mg | ORAL_CAPSULE | Freq: Once | ORAL | Status: AC
  Administered 2018-08-04: 14:00:00 800 mg via ORAL
  Filled 2018-08-04: qty 4

## 2018-08-04 MED ORDER — TETRACAINE HCL 0.5 % OP SOLN
2.0000 [drp] | Freq: Once | OPHTHALMIC | Status: AC
  Administered 2018-08-04: 14:00:00 2 [drp] via OPHTHALMIC
  Filled 2018-08-04: qty 4

## 2018-08-04 MED ORDER — FLUORESCEIN SODIUM 1 MG OP STRP
1.0000 | ORAL_STRIP | Freq: Once | OPHTHALMIC | Status: AC
  Administered 2018-08-04: 1 via OPHTHALMIC
  Filled 2018-08-04: qty 1

## 2018-08-04 MED ORDER — ACYCLOVIR 400 MG PO TABS: 800.0000 mg | ORAL_TABLET | Freq: Every day | ORAL | 0 refills | Status: AC

## 2018-08-04 MED ORDER — HYDROCODONE-ACETAMINOPHEN 5-325 MG PO TABS: 1.0000 | ORAL_TABLET | Freq: Four times a day (QID) | ORAL | 0 refills | Status: DC | PRN

## 2018-08-04 NOTE — ED Notes (Signed)
Woods lamp at bedside.  

## 2018-08-04 NOTE — ED Provider Notes (Signed)
[facility] EMERGENCY DEPARTMENT Provider Note   CSN: [id number] Arrival date & time: 08/04/18  1230    History   Chief Complaint No chief complaint on file.   HPI [name] is a 73 y.o. male.     Patient is a 73 year old gentleman with past medical history of depression, diabetes, gout, hyperlipidemia, hypertension who presents emergency department for left eyelid swelling.  Patient reports that about 3 days ago he began to have a rash on the left side of his forehead and scalp with swelling to the left eye.  Reports that the rash is very painful and his eye is very irritated.  Reports that he has not had a history of shingles but he does have a history of chickenpox in the past.  Denies any fever, chills, nausea, vomiting.  Has tried Tylenol and states it is helpful.  He also reports that for about 7 days he has had nausea, vomiting and diarrhea almost every day.  Denies any blood in his stool.  Denies any abdominal pain.  Reports no fever or sick contacts.  Of note, patient was seen in the emergency department 3 days ago for chest pain as well as nausea, vomiting, diarrhea.  He was not in DKA at that time.  He was offered insulin and declined.  He also declined admission and signed out AMA.     Past Medical History:  Diagnosis Date  . Allergic rhinitis due to pollen   . Chronic kidney disease, stage III (moderate) ([place]) 12/19/2012  . Depression   . Diabetes mellitus ([place])   . Diverticulosis   . Erectile dysfunction associated with type 2 diabetes mellitus ([place]) 05/28/2015  . Former very heavy cigarette smoker (more than 40 per day) 10/07/2014  . GERD (gastroesophageal reflux disease)   . Gout   . Hyperlipidemia   . Hypertension   . Hypogonadism male 06/14/2012  . Iron deficiency   . Memory loss   . Osteoarthritis   . Personal history of colonic polyps    1996    Patient Active Problem List   Diagnosis Date Noted  . Abdominal pain, vomiting, and  diarrhea 08/01/2018  . Dehydration 08/01/2018  . Mild cognitive impairment 05/08/2018  . Alteration consciousness 05/08/2018  . Poor balance 02/23/2018  . Falls 10/14/2017  . Esophageal dysphagia   . Uncontrolled type 2 diabetes mellitus with peripheral neuropathy ([place]) 05/11/2016  . Erectile dysfunction associated with type 2 diabetes mellitus ([place]) 05/28/2015  . Former very heavy cigarette smoker (more than 40 per day) 10/07/2014  . Chronic kidney disease, stage III (moderate) ([place]) 12/19/2012  . Hypogonadism male 06/14/2012  . Chest pain 11/11/2011  . Obesity (BMI 30-39.9) 09/27/2011  . OSA (obstructive sleep apnea) 03/09/2011  . Diabetic neuropathy ([place]) 12/29/2010  . Family history of MI (myocardial infarction) 09/16/2010  . HIP REPLACEMENT, RIGHT, HX OF 02/05/2010  . Hypertriglyceridemia 06/13/2008  . GOUT 06/13/2008  . Major depressive disorder, recurrent episode, in partial remission ([place]) 06/13/2008  . Essential hypertension 06/13/2008  . ALLERGIC RHINITIS 06/13/2008  . GERD 06/13/2008  . OSTEOARTHRITIS 06/13/2008    Past Surgical History:  Procedure Laterality Date  . CARDIAC CATHETERIZATION  11/2011   [facility]  . CHOLECYSTECTOMY  1980  . ESOPHAGOGASTRODUODENOSCOPY (EGD) WITH PROPOFOL N/A 09/27/2017   Procedure: ESOPHAGOGASTRODUODENOSCOPY (EGD) WITH PROPOFOL;  Surgeon: Lin Landsman, MD;  Location: [facility];  Service: Gastroenterology;  Laterality: N/A;  . EYE SURGERY Right 07/29/2016   cataract - Dr. Manuella Ghazi  .  EYE SURGERY Left 08/23/2106   cataract - Dr. Manuella Ghazi  . TOTAL HIP ARTHROPLASTY  2009   Dr. Gladstone Lighter  . TOTAL HIP ARTHROPLASTY          Home Medications    Prior to Admission medications   Medication Sig Start Date End Date Taking? Authorizing Provider  omeprazole (PRILOSEC) 40 MG capsule TAKE 1 CAPSULE (40 MG TOTAL) BY MOUTH 2 (TWO) TIMES DAILY BEFORE A MEAL. 03/22/18 08/04/18 Yes Vanga, Tally Due, MD  acyclovir (ZOVIRAX) 400 MG tablet Take 2  tablets (800 mg total) by mouth 5 (five) times daily for 7 days. 08/04/18 08/11/18  Madilyn Hook A, [name]  ampicillin (PRINCIPEN) 500 MG capsule TAKE 1 CAPSULE DAILY WITH FOOD ORALLY 30 DAYS 06/13/18   [provider]  Ascorbic Acid (VITAMIN C) 1000 MG tablet Take 1,000 mg by mouth daily.    [provider]  aspirin EC 81 MG tablet Take 81 mg by mouth at bedtime.    [provider]  atenolol (TENORMIN) 50 MG tablet Take 1 tablet (50 mg total) by mouth daily. 05/18/18   Copland, Frederico Hamman, MD  buPROPion (WELLBUTRIN SR) 150 MG 12 hr tablet TAKE 1 TABLET BY MOUTH TWICE A DAY 06/07/17   Copland, Frederico Hamman, MD  cholecalciferol (VITAMIN D) 1000 UNITS tablet Take 1,000 Units by mouth daily.      [provider]  colchicine 0.6 MG tablet Take 1 tablet (0.6 mg total) by mouth 2 (two) times daily. 07/12/18   Copland, Frederico Hamman, MD  donepezil (ARICEPT) 10 MG tablet Take 1 tablet (10 mg total) by mouth at bedtime. 05/18/18   Copland, Frederico Hamman, MD  doxazosin (CARDURA) 8 MG tablet Take 0.5 tablets (4 mg total) by mouth at bedtime. 05/18/18   Copland, Frederico Hamman, MD  erythromycin ophthalmic ointment Place a 1/2 inch ribbon of ointment into the lower eyelid. 08/04/18   Alveria Apley, [name]  finasteride (PROSCAR) 5 MG tablet Take 1 tablet (5 mg total) by mouth daily. 01/16/18   Copland, Frederico Hamman, MD  fluticasone (FLONASE) 50 MCG/ACT nasal spray INHALE 2 SPRAYS INTO EACH NOSTRIL EVERY DAY AS NEEDED FOR CONGESTION 05/18/18   Copland, Frederico Hamman, MD  gabapentin (NEURONTIN) 300 MG capsule Take 2 capsules (600 mg total) by mouth at bedtime. 07/12/18   Copland, Frederico Hamman, MD  HYDROcodone-acetaminophen (NORCO) 5-325 MG tablet Take 1 tablet by mouth every 6 (six) hours as needed for moderate pain. 08/04/18   Margarita Mail, [name]  ibuprofen (ADVIL,MOTRIN) 200 MG tablet Take 200-400 mg by mouth every 6 (six) hours as needed for headache (pain).    [provider]  indomethacin (INDOCIN) 50 MG capsule Take 1 capsule  (50 mg total) by mouth 2 (two) times daily with a meal. 07/12/18   Copland, Frederico Hamman, MD  insulin NPH Human (HUMULIN N,NOVOLIN N) 100 UNIT/ML injection Inject 0.25 mLs (25 Units total) into the skin 2 (two) times daily before a meal. 10/14/17   Ria Bush, MD  insulin regular (NOVOLIN R,HUMULIN R) 100 units/mL injection Inject 0.2 mLs (20 Units total) into the skin 2 (two) times daily before a meal. 10/14/17   Ria Bush, MD  lisinopril (PRINIVIL,ZESTRIL) 10 MG tablet Take 1 tablet (10 mg total) by mouth daily. 05/22/18   Copland, Frederico Hamman, MD  memantine (NAMENDA) 10 MG tablet Take 1 tablet (10 mg total) by mouth 2 (two) times daily. 05/18/18   Copland, Frederico Hamman, MD  ondansetron (ZOFRAN) 4 MG tablet Take 1 tablet (4 mg total) by mouth every 8 (eight) hours  as needed for nausea or vomiting. 08/04/18   Margarita Mail, [name]  permethrin (ELIMITE) 5 % cream Apply to all over body from neck to toes.  Sleep with it on and wash off in 12 hours. Repeat in 2 weeks. 08/02/18   Copland, Frederico Hamman, MD  Semaglutide,0.25 or 0.5MG /DOS, (OZEMPIC, 0.25 OR 0.5 MG/DOSE,) 2 MG/1.5ML SOPN Inject 0.5 mg into the skin once a week. 05/18/18   Philemon Kingdom, MD  sildenafil (REVATIO) 20 MG tablet Generic Revatio / Sildanefil 20 mg. 2 - 5 tabs 30 mins prior to intercourse. 04/13/18   Copland, Frederico Hamman, MD  simvastatin (ZOCOR) 40 MG tablet Take 1 tablet (40 mg total) by mouth at bedtime. 05/18/18   Copland, Frederico Hamman, MD  testosterone cypionate (DEPOTESTOSTERONE CYPIONATE) 200 MG/ML injection Inject 150 mg every 14 days 05/18/18   Copland, Frederico Hamman, MD  venlafaxine XR (EFFEXOR-XR) 75 MG 24 hr capsule TAKE ONE CAPSULE (75 MG) BY MOUTH EVERY MORNING AND TWO CAPSULES (150 MG) AT NIGHT 05/18/18   Copland, Frederico Hamman, MD    Family History Family History  Problem Relation Age of Onset  . Diabetes Mother   . Alzheimer's disease Mother   . Diabetes Father   . Lung cancer Father        Age 34  . Stroke Father   . Heart attack Father   . Lung  cancer Sister        lung  . Heart disease Maternal Grandfather   . COPD Sister   . Heart attack Sister   . Heart disease Sister     Social History Social History   Tobacco Use  . Smoking status: Former Smoker    Packs/day: 3.00    Years: 35.00    Pack years: 105.00    Types: Cigarettes    Last attempt to quit: 04/12/2000    Years since quitting: 18.3  . Smokeless tobacco: Never Used  Substance Use Topics  . Alcohol use: No  . Drug use: No     Allergies   Tetracycline   Review of Systems Review of Systems  Constitutional: Negative for chills and fever.  HENT: Negative for ear pain and sore throat.   Eyes: Positive for pain, discharge and redness. Negative for photophobia and itching.  Respiratory: Negative for cough and shortness of breath.   Cardiovascular: Negative for chest pain and palpitations.  Gastrointestinal: Negative for abdominal pain and vomiting.  Genitourinary: Negative for dysuria and hematuria.  Musculoskeletal: Negative for arthralgias and back pain.  Skin: Positive for rash. Negative for color change.  Allergic/Immunologic: Negative for immunocompromised state.  Neurological: Negative for seizures and syncope.  All other systems reviewed and are negative.    Physical Exam Updated Vital Signs BP 119/78   Pulse (!) 117   Temp 98 F (36.7 C) (Oral)   Resp 16   Ht 5\' 11"  (1.803 m)   Wt 93 kg   SpO2 98%   BMI 28.59 kg/m   Physical Exam Vitals signs and nursing note reviewed. Exam conducted with a chaperone present.  Constitutional:      Appearance: Normal appearance.  HENT:     Head: Normocephalic.     Comments: Blistering and crusted vesicles in the distribution of the left V1 nerve root. Rash over the upper eyelid with crusts and vesicles with injected conjunctiva on the L Eyes:     Extraocular Movements: Extraocular movements intact.     Conjunctiva/sclera:     Left eye: Left conjunctiva is injected.  Pupils: Pupils are equal,  round, and reactive to light.  Pulmonary:     Effort: Pulmonary effort is normal.  Skin:    General: Skin is dry.  Neurological:     General: No focal deficit present.     Mental Status: He is alert.  Psychiatric:        Mood and Affect: Mood normal.          ED Treatments / Results  Labs (all labs ordered are listed, but only abnormal results are displayed) Labs Reviewed  BASIC METABOLIC PANEL - Abnormal; Notable for the following components:      Result Value   Sodium 127 (*)    Chloride 86 (*)    CO2 14 (*)    Glucose, Bld 468 (*)    Creatinine, Ser 1.99 (*)    GFR calc non Af Amer 33 (*)    GFR calc Af Amer 38 (*)    Anion gap 27 (*)    All other components within normal limits  CBC WITH DIFFERENTIAL/PLATELET - Abnormal; Notable for the following components:   RBC 5.87 (*)    Hemoglobin 17.6 (*)    All other components within normal limits  URINALYSIS, ROUTINE W REFLEX MICROSCOPIC - Abnormal; Notable for the following components:   Glucose, UA >=500 (*)    Hgb urine dipstick SMALL (*)    Ketones, ur 80 (*)    Protein, ur >=300 (*)    All other components within normal limits  POCT I-STAT EG7 - Abnormal; Notable for the following components:   pH, Ven 7.197 (*)    pCO2, Ven 43.1 (*)    Bicarbonate 16.7 (*)    TCO2 18 (*)    Acid-base deficit 11.0 (*)    Sodium 125 (*)    Calcium, Ion 1.13 (*)    HCT 53.0 (*)    Hemoglobin 18.0 (*)    All other components within normal limits  I-STAT VENOUS BLOOD GAS, ED    EKG None  Radiology No results found.  Procedures Procedures (including critical care time)  Medications Ordered in ED Medications  fluorescein ophthalmic strip 1 strip (1 strip Left Eye Given 08/04/18 1333)  tetracaine (PONTOCAINE) 0.5 % ophthalmic solution 2 drop (2 drops Left Eye Given 08/04/18 1333)  ondansetron (ZOFRAN-ODT) disintegrating tablet 4 mg (4 mg Oral Given 08/04/18 1251)  sodium chloride 0.9 % bolus 1,000 mL (0 mLs Intravenous  Stopped 08/04/18 1717)  erythromycin ophthalmic ointment 1 application (1 application Left Eye Given 08/04/18 1334)  acyclovir (ZOVIRAX) 200 MG capsule 800 mg (800 mg Oral Given 08/04/18 1334)  insulin aspart (novoLOG) injection 10 Units (10 Units Intravenous Given 08/04/18 1434)     Initial Impression / Assessment and Plan / ED Course  I have reviewed the triage vital signs and the nursing notes.  Pertinent labs & imaging results that were available during my care of the patient were reviewed by me and considered in my medical decision making (see chart for details).  Clinical Course as of Aug 06 1628  Fri Aug 04, 2018  1322 Patient is with herpes zoster in the V1 distribution on the left side.  Discussed case with Dr. Carolynn Sayers ophthalmology on-call who stated that and acyclovir pills at this time.  Patient to follow-up in the office with him on Monday.  Patient can just walk into his office between 8 and 1 PM.  I have also obtaining some labs and giving some fluids and Zofran because patient had  some nausea and vomiting over the last few days.  Otherwise patient appears stable and well   [KM]  1406 I discussed with patient his abnormal lab findings including acute kidney injury, anion gap of 27, hyponatremia.  I discussed that this is actually worsening from 3 days ago and he is continuing to have nausea vomiting.  I discussed with the patient that we should be treating his hyperglycemia with fluids, insulin and likely admission.  Patient laughed and said he is not staying in the hospital.  Patient also reports that he is not going to take insulin from the hospital.  Reports that he will go home and take his insulin.  He agrees to get IV fluids.  I discussed with him the risks of death if he leaves here.  I discussed with him that if he were my family member I would want him to stay in the hospital and get further treatment.  Patient reports that he understands but that "there is no I am staying in the  hospital".   [KM]    Clinical Course User Index [KM] Alveria Apley, [name]         Final Clinical Impressions(s) / ED Diagnoses   Final diagnoses:  Herpes zoster without complication  Hypercalcemia  Non-intractable vomiting with nausea, unspecified vomiting type  AKI (acute kidney injury) [facility])    ED Discharge Orders         Ordered    erythromycin ophthalmic ointment     08/04/18 1433    acyclovir (ZOVIRAX) 400 MG tablet  5 times daily     08/04/18 1433    HYDROcodone-acetaminophen (NORCO) 5-325 MG tablet  Every 6 hours PRN     08/04/18 1707    ondansetron (ZOFRAN) 4 MG tablet  Every 8 hours PRN     08/04/18 1707           Kristine Royal 08/07/18 1630    Drenda Freeze, MD 08/10/18 [phone]

## 2018-08-04 NOTE — ED Notes (Signed)
NS bolus continues to infuse.  Pt given cup of water.

## 2018-08-04 NOTE — Discharge Instructions (Addendum)
Get help right away if: Your blood glucose monitor reads high even when you are taking insulin. You faint. You have chest pain. You have trouble breathing. You have sudden trouble speaking or swallowing. You have vomiting or diarrhea that gets worse after 3 hours. You are unable to stay awake. You have trouble thinking. You are severely dehydrated. Symptoms of severe dehydration include: Extreme thirst. Dry mouth. Rapid breathing.

## 2018-08-04 NOTE — ED Triage Notes (Signed)
Pt from home c/o swelling to L eye x 2-3 days; rash noted to L side of face, pt states it is painful; pt also endorses N/V/D since last Friday; denies abd pain

## 2018-08-04 NOTE — ED Provider Notes (Signed)
  Physical Exam  BP 123/82   Pulse (!) 117   Temp 98 F (36.7 C) (Oral)   Resp 16   Ht 5\' 11"  (1.803 m)   Wt 93 kg   SpO2 100%   BMI 28.59 kg/m   Physical Exam  73 y/o male with type 2 IDDM presents for eye pain. FOund to have herpes zoster keratitis. He was subsequently found to be in DKA. Patient has already refused admission but has agreed to stay for fluids and Sub Q insulin. We will recheck labs to see if the gap is closing.  ED Course/Procedures   Clinical Course as of Aug 04 1507  Fri Aug 04, 2018  1322 Patient is with herpes zoster in the V1 distribution on the left side.  Discussed case with Dr. Carolynn Sayers ophthalmology on-call who stated that and acyclovir pills at this time.  Patient to follow-up in the office with him on Monday.  Patient can just walk into his office between 8 and 1 PM.  I have also obtaining some labs and giving some fluids and Zofran because patient had some nausea and vomiting over the last few days.  Otherwise patient appears stable and well   [KM]  1406 I discussed with patient his abnormal lab findings including acute kidney injury, anion gap of 27, hyponatremia.  I discussed that this is actually worsening from 3 days ago and he is continuing to have nausea vomiting.  I discussed with the patient that we should be treating his hyperglycemia with fluids, insulin and likely admission.  Patient laughed and said he is not staying in the hospital.  Patient also reports that he is not going to take insulin from the hospital.  Reports that he will go home and take his insulin.  He agrees to get IV fluids.  I discussed with him the risks of death if he leaves here.  I discussed with him that if he were my family member I would want him to stay in the hospital and get further treatment.  Patient reports that he understands but that "there is no I am staying in the hospital".   [KM]    Clinical Course User Index [KM] Alveria Apley, [name]    Procedures  MDM   I  spoke with patient who states that he has a 81 year old mother with Alzheimer's.  He is still unwilling to wait and states that her caretaker is leaving and he needs to leave.  I discussed risks and benefits but the patient remains adamant that he must leave to take care of his mother.  He will leave [place].  He is competent to make the decision and understands the risks of leaving.      Margarita Mail, [name] 08/05/18 2310    Drenda Freeze, MD 08/10/18 [phone]

## 2018-08-06 NOTE — Progress Notes (Deleted)
Denea Cheaney T. Sharvi Mooneyhan, MD Primary Care and [place] at [facility] [place], [address] Phone: [phone]  FAX: [phone]  [name] - 73 y.o. male  [name] [id number]  Date of Birth: [birth year]  Visit Date: 08/07/2018  PCP: [name], MD  Referred by: [name], MD  No chief complaint on file.  Subjective:   [name] is a 73 y.o. very pleasant male patient who presents with the following:  73 yo noncompliant patient who has left the ER now twice AMA - first with chest pain and later with zoster of the eye.  He had concerns that he may have had scabies and called in last week and dx with zoster in ER then left AMA.  They also thought he was in DKA.    Past Medical History, Surgical History, Social History, Family History, Problem List, Medications, and Allergies have been reviewed and updated if relevant.  Patient Active Problem List   Diagnosis Date Noted  . Uncontrolled type 2 diabetes mellitus with peripheral neuropathy ([place]) 05/11/2016    Priority: Medium  . Former very heavy cigarette smoker (more than 40 per day) 10/07/2014    Priority: Medium  . Chronic kidney disease, stage III (moderate) ([place]) 12/19/2012    Priority: Medium  . OSA (obstructive sleep apnea) 03/09/2011    Priority: Medium  . Diabetic neuropathy ([place]) 12/29/2010    Priority: Medium  . Hypertriglyceridemia 06/13/2008    Priority: Medium  . Major depressive disorder, recurrent episode, in partial remission ([place]) 06/13/2008    Priority: Medium  . Essential hypertension 06/13/2008    Priority: Medium  . Abdominal pain, vomiting, and diarrhea 08/01/2018  . Dehydration 08/01/2018  . Mild cognitive impairment 05/08/2018  . Alteration consciousness 05/08/2018  . Poor balance 02/23/2018  . Falls 10/14/2017  . Esophageal dysphagia   . Erectile dysfunction associated with type 2 diabetes mellitus ([place]) 05/28/2015  .  Hypogonadism male 06/14/2012  . Chest pain 11/11/2011  . Obesity (BMI 30-39.9) 09/27/2011  . Family history of MI (myocardial infarction) 09/16/2010  . HIP REPLACEMENT, RIGHT, HX OF 02/05/2010  . GOUT 06/13/2008  . ALLERGIC RHINITIS 06/13/2008  . GERD 06/13/2008  . OSTEOARTHRITIS 06/13/2008    Past Medical History:  Diagnosis Date  . Allergic rhinitis due to pollen   . Chronic kidney disease, stage III (moderate) ([place]) 12/19/2012  . Depression   . Diabetes mellitus ([place])   . Diverticulosis   . Erectile dysfunction associated with type 2 diabetes mellitus ([place]) 05/28/2015  . Former very heavy cigarette smoker (more than 40 per day) 10/07/2014  . GERD (gastroesophageal reflux disease)   . Gout   . Hyperlipidemia   . Hypertension   . Hypogonadism male 06/14/2012  . Iron deficiency   . Memory loss   . Osteoarthritis   . Personal history of colonic polyps    1996    Past Surgical History:  Procedure Laterality Date  . CARDIAC CATHETERIZATION  11/2011   [facility]  . CHOLECYSTECTOMY  1980  . ESOPHAGOGASTRODUODENOSCOPY (EGD) WITH PROPOFOL N/A 09/27/2017   Procedure: ESOPHAGOGASTRODUODENOSCOPY (EGD) WITH PROPOFOL;  Surgeon: Lin Landsman, MD;  Location: [facility];  Service: Gastroenterology;  Laterality: N/A;  . EYE SURGERY Right 07/29/2016   cataract - Dr. Manuella Ghazi  . EYE SURGERY Left 08/23/2106   cataract - Dr. Manuella Ghazi  . TOTAL HIP ARTHROPLASTY  2009   Dr. Gladstone Lighter  . TOTAL HIP ARTHROPLASTY  Social History   Socioeconomic History  . Marital status: Widowed    Spouse name: Not on file  . Number of children: 4  . Years of education: 12th  . Highest education level: Not on file  Occupational History  . Occupation: Counsellor  . 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 Financial resource strain: Not on file  . Food insecurity:    Worry: Not on file    Inability: Not on file  . Transportation needs:    Medical: Not on file     Non-medical: Not on file  Tobacco Use  . Smoking status: Former Smoker    Packs/day: 3.00    Years: 35.00    Pack years: 105.00    Types: Cigarettes    Last attempt to quit: 04/12/2000    Years since quitting: 18.3  . Smokeless tobacco: Never Used  Substance and Sexual Activity  . Alcohol use: No  . Drug use: No  . Sexual activity: Never  Lifestyle  . Physical activity:    Days per week: Not on file    Minutes per session: Not on file  . Stress: Not on file  Relationships  . Social connections:    Talks on phone: Not on file    Gets together: Not on file    Attends religious service: Not on file    Active member of club or organization: Not on file    Attends meetings of clubs or organizations: Not on file    Relationship status: Not on file  . Intimate partner violence:    Fear of current or ex partner: Not on file    Emotionally abused: Not on file    Physically abused: Not on file    Forced sexual activity: Not on file  Other Topics Concern  . Not on file  Social History Narrative   No regular exercise   Caffeine use: yes, dt coke   Lives at home with his mother lives with him.   Wife Fraser Din recently deceased   Right-handed.    Family History  Problem Relation Age of Onset  . Diabetes Mother   . Alzheimer's disease Mother   . Diabetes Father   . Lung cancer Father        Age 60  . Stroke Father   . Heart attack Father   . Lung cancer Sister        lung  . Heart disease Maternal Grandfather   . COPD Sister   . Heart attack Sister   . Heart disease Sister     Allergies  Allergen Reactions  . Tetracycline Itching and Rash    Medication list reviewed and updated in full in [facility].   GEN: No acute illnesses, no fevers, chills. GI: No n/v/d, eating normally Pulm: No SOB Interactive and getting along well at home.  Otherwise, ROS is as per the HPI.  Objective:   There were no vitals taken for this visit.  GEN: WDWN, NAD, Non-toxic, A & O  x 3 HEENT: Atraumatic, Normocephalic. Neck supple. No masses, No LAD. Ears and Nose: No external deformity. CV: RRR, No M/G/R. No JVD. No thrill. No extra heart sounds. PULM: CTA B, no wheezes, crackles, rhonchi. No retractions. No resp. distress. No accessory muscle use. EXTR: No c/c/e NEURO Normal gait.  PSYCH: Normally interactive. Conversant. Not depressed or anxious appearing.  Calm demeanor.   Laboratory and Imaging Data:  Assessment  and Plan:   ***

## 2018-08-07 ENCOUNTER — Telehealth: Payer: Self-pay

## 2018-08-07 ENCOUNTER — Other Ambulatory Visit: Payer: Self-pay

## 2018-08-07 ENCOUNTER — Ambulatory Visit: Payer: Medicare Other | Admitting: Family Medicine

## 2018-08-07 DIAGNOSIS — E119 Type 2 diabetes mellitus without complications: Secondary | ICD-10-CM | POA: Diagnosis not present

## 2018-08-07 DIAGNOSIS — Z961 Presence of intraocular lens: Secondary | ICD-10-CM | POA: Diagnosis not present

## 2018-08-07 DIAGNOSIS — B0239 Other herpes zoster eye disease: Secondary | ICD-10-CM | POA: Diagnosis not present

## 2018-08-07 DIAGNOSIS — H4922 Sixth [abducent] nerve palsy, left eye: Secondary | ICD-10-CM | POA: Diagnosis not present

## 2018-08-07 NOTE — Patient Outreach (Signed)
[facility]) Care Management  08/07/2018  [name] 26-Dec-1945 [id number]   Medication Adherence call to [name]  HIPPA Compliant Voice message left with a call back number. [name] is showing past due on Lisinopril 10 mg and Simvastatin 40 mg under [place].  [place] Management Direct Dial [phone]  Fax [phone] [email or web address]

## 2018-08-07 NOTE — Telephone Encounter (Signed)
Attempted to reach patient on both mobile and home phone to schedule appointment related to ED visit on 08/04/18. Patient did not show for office appt with PCP scheduled on 08/07/18 related to ED visit on 08/01/18.   Left message on mobile phone with contact info. If patient contacts [place], please reschedule office visit with PCP to discuss multiple ED visits.

## 2018-08-07 NOTE — Telephone Encounter (Signed)
Would you also send him a letter in the mail asking him to follow-up from his 2 recent ER visits.  Thanks!

## 2018-08-08 DIAGNOSIS — H4922 Sixth [abducent] nerve palsy, left eye: Secondary | ICD-10-CM | POA: Diagnosis not present

## 2018-08-08 DIAGNOSIS — B0239 Other herpes zoster eye disease: Secondary | ICD-10-CM | POA: Diagnosis not present

## 2018-08-08 DIAGNOSIS — H532 Diplopia: Secondary | ICD-10-CM | POA: Diagnosis not present

## 2018-08-15 DIAGNOSIS — B0239 Other herpes zoster eye disease: Secondary | ICD-10-CM | POA: Diagnosis not present

## 2018-08-15 DIAGNOSIS — H532 Diplopia: Secondary | ICD-10-CM | POA: Diagnosis not present

## 2018-08-15 DIAGNOSIS — H4922 Sixth [abducent] nerve palsy, left eye: Secondary | ICD-10-CM | POA: Diagnosis not present

## 2018-08-17 ENCOUNTER — Other Ambulatory Visit: Payer: Self-pay

## 2018-08-17 MED ORDER — ONDANSETRON HCL 4 MG PO TABS: 4.0000 mg | ORAL_TABLET | Freq: Three times a day (TID) | ORAL | 1 refills | Status: DC | PRN

## 2018-08-17 NOTE — Telephone Encounter (Signed)
Pt was seen at [facility] ED on 08/04/18; pt has shingles at his eye; pt seeing eye dr for the shingles but pt is nauseated all the time and vomits 2 -3 x a day. Last vomited 15' ago;no blood in vomitus. Pt requesting refill ondansetron 4 mg [facility]; med was given by PA at ED # 10 on 08/04/18.pt has no fever but runny nose and S/T for 2 or 3 days. Pt request cb if med called in. Pt not sure why so N&V; shingles can cause vomiting also.Please advise.

## 2018-08-17 NOTE — Telephone Encounter (Signed)
Mr. Duddy notified as instructed by telephone.

## 2018-08-17 NOTE — Telephone Encounter (Signed)
Shingles can cause nausea  Certainly he could have another virus, but mainly I would worry about global symptoms, fever, shortness of breath, diffuse new joint aches

## 2018-08-21 ENCOUNTER — Ambulatory Visit (INDEPENDENT_AMBULATORY_CARE_PROVIDER_SITE_OTHER): Payer: Medicare Other | Admitting: Family Medicine

## 2018-08-21 ENCOUNTER — Ambulatory Visit: Payer: Medicare Other | Admitting: Family Medicine

## 2018-08-21 ENCOUNTER — Other Ambulatory Visit: Payer: Self-pay

## 2018-08-21 ENCOUNTER — Encounter: Payer: Self-pay | Admitting: Family Medicine

## 2018-08-21 ENCOUNTER — Telehealth: Payer: Self-pay | Admitting: Family Medicine

## 2018-08-21 VITALS — BP 120/78 | HR 117 | Temp 97.4°F | Ht 71.0 in | Wt 197.8 lb

## 2018-08-21 DIAGNOSIS — R079 Chest pain, unspecified: Secondary | ICD-10-CM

## 2018-08-21 DIAGNOSIS — A084 Viral intestinal infection, unspecified: Secondary | ICD-10-CM | POA: Diagnosis not present

## 2018-08-21 DIAGNOSIS — Z9119 Patient's noncompliance with other medical treatment and regimen: Secondary | ICD-10-CM

## 2018-08-21 DIAGNOSIS — R1319 Other dysphagia: Secondary | ICD-10-CM

## 2018-08-21 DIAGNOSIS — K219 Gastro-esophageal reflux disease without esophagitis: Secondary | ICD-10-CM

## 2018-08-21 DIAGNOSIS — E291 Testicular hypofunction: Secondary | ICD-10-CM

## 2018-08-21 DIAGNOSIS — R1310 Dysphagia, unspecified: Secondary | ICD-10-CM | POA: Diagnosis not present

## 2018-08-21 DIAGNOSIS — R1110 Vomiting, unspecified: Secondary | ICD-10-CM

## 2018-08-21 DIAGNOSIS — Z91199 Patient's noncompliance with other medical treatment and regimen due to unspecified reason: Secondary | ICD-10-CM

## 2018-08-21 MED ORDER — ONDANSETRON HCL 4 MG PO TABS: 4.0000 mg | ORAL_TABLET | Freq: Three times a day (TID) | ORAL | 1 refills | Status: DC | PRN

## 2018-08-21 MED ORDER — OMEPRAZOLE 40 MG PO CPDR: 40.0000 mg | DELAYED_RELEASE_CAPSULE | Freq: Two times a day (BID) | ORAL | 1 refills | Status: DC

## 2018-08-21 MED ORDER — ONDANSETRON 4 MG PO TBDP
4.0000 mg | ORAL_TABLET | Freq: Once | ORAL | Status: AC
  Administered 2018-08-21: 4 mg via ORAL

## 2018-08-21 MED ORDER — TESTOSTERONE CYPIONATE 200 MG/ML IM SOLN
150.0000 mg | Freq: Once | INTRAMUSCULAR | Status: AC
  Administered 2018-08-21: 150 mg via INTRAMUSCULAR

## 2018-08-21 NOTE — Telephone Encounter (Signed)
Appointment 5/11 @11 :40 pt aware

## 2018-08-21 NOTE — Telephone Encounter (Signed)
Have him follow-up with me in the office

## 2018-08-21 NOTE — Progress Notes (Signed)
[name] T. Dustin Reino, MD Primary Care and [place] at [facility] [place], [address] Phone: [phone]  FAX: [phone]  [name] - 73 y.o. male  [name] [id number]  Date of Birth: March 07, 1946  Visit Date: 08/21/2018  PCP: Dustin Loffler, MD  Referred by: Dustin Loffler, MD  Chief Complaint  Patient presents with  . Emesis    ?withdrawl from Testosterone & Venlafaxine   Subjective:   [name] is a 73 y.o. very pleasant male patient who presents with the following:  He comes in with some acute nausea and additionally some diarrhea over the weekend.  He is quite nauseous today in the office.  We gave him some oral Zofran.  He also discontinued all of his medication with the exception of his insulin in March this includes all of his dementia medication as well as all of his depression medication which were at high doses.  He globally feels weak and does not feel well.  Quit taking all of his meds instead of his insulin.  120-150 in the morning and at the night. Can redo his test shots.  Stopped all of his oral medication since March  Write out how to restart   Refill Zofran Refill Prilosec   Past Medical History, Surgical History, Social History, Family History, Problem List, Medications, and Allergies have been reviewed and updated if relevant.  Patient Active Problem List   Diagnosis Date Noted  . Uncontrolled type 2 diabetes mellitus with peripheral neuropathy ([place]) 05/11/2016    Priority: Medium  . Former very heavy cigarette smoker (more than 40 per day) 10/07/2014    Priority: Medium  . Chronic kidney disease, stage III (moderate) ([place]) 12/19/2012    Priority: Medium  . OSA (obstructive sleep apnea) 03/09/2011    Priority: Medium  . Diabetic neuropathy ([place]) 12/29/2010    Priority: Medium  . Hypertriglyceridemia 06/13/2008    Priority: Medium  . Major depressive disorder,  recurrent episode, in partial remission ([place]) 06/13/2008    Priority: Medium  . Essential hypertension 06/13/2008    Priority: Medium  . Abdominal pain, vomiting, and diarrhea 08/01/2018  . Dehydration 08/01/2018  . Mild cognitive impairment 05/08/2018  . Alteration consciousness 05/08/2018  . Poor balance 02/23/2018  . Falls 10/14/2017  . Esophageal dysphagia   . Erectile dysfunction associated with type 2 diabetes mellitus ([place]) 05/28/2015  . Hypogonadism male 06/14/2012  . Chest pain 11/11/2011  . Obesity (BMI 30-39.9) 09/27/2011  . Family history of MI (myocardial infarction) 09/16/2010  . HIP REPLACEMENT, RIGHT, HX OF 02/05/2010  . GOUT 06/13/2008  . ALLERGIC RHINITIS 06/13/2008  . GERD 06/13/2008  . OSTEOARTHRITIS 06/13/2008    Past Medical History:  Diagnosis Date  . Allergic rhinitis due to pollen   . Chronic kidney disease, stage III (moderate) ([place]) 12/19/2012  . Depression   . Diabetes mellitus ([place])   . Diverticulosis   . Erectile dysfunction associated with type 2 diabetes mellitus ([place]) 05/28/2015  . Former very heavy cigarette smoker (more than 40 per day) 10/07/2014  . GERD (gastroesophageal reflux disease)   . Gout   . Hyperlipidemia   . Hypertension   . Hypogonadism male 06/14/2012  . Iron deficiency   . Memory loss   . Osteoarthritis   . Personal history of colonic polyps    1996    Past Surgical History:  Procedure Laterality Date  . CARDIAC CATHETERIZATION  11/2011   [facility]  .  CHOLECYSTECTOMY  1980  . ESOPHAGOGASTRODUODENOSCOPY (EGD) WITH PROPOFOL N/A 09/27/2017   Procedure: ESOPHAGOGASTRODUODENOSCOPY (EGD) WITH PROPOFOL;  Surgeon: Dustin Landsman, MD;  Location: [facility];  Service: Gastroenterology;  Laterality: N/A;  . EYE SURGERY Right 07/29/2016   [facility] - Dr. Manuella [name]  . EYE SURGERY Left 08/23/2106   [facility] - Dr. Manuella [name]  . TOTAL HIP ARTHROPLASTY  2009   Dr. Gladstone [name]  . TOTAL HIP ARTHROPLASTY      Social History   Socioeconomic  History  . Marital status: Widowed    Spouse name: Not on file  . Number of children: 4  . Years of education: 12th  . Highest education level: Not on file  Occupational History  . Occupation: Counsellor  . 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 Financial resource strain: Not on file  . Food insecurity:    Worry: Not on file    Inability: Not on file  . Transportation needs:    Medical: Not on file    Non-medical: Not on file  Tobacco Use  . Smoking status: Former Smoker    Packs/day: 3.00    Years: 35.00    Pack years: 105.00    Types: Cigarettes    Last attempt to quit: 04/12/2000    Years since quitting: 18.3  . Smokeless tobacco: Never Used  Substance and Sexual Activity  . Alcohol use: No  . Drug use: No  . Sexual activity: Never  Lifestyle  . Physical activity:    Days per week: Not on file    Minutes per session: Not on file  . Stress: Not on file  Relationships  . Social connections:    Talks on phone: Not on file    Gets together: Not on file    Attends religious service: Not on file    Active member of club or organization: Not on file    Attends meetings of clubs or organizations: Not on file    Relationship status: Not on file  . Intimate partner violence:    Fear of current or ex partner: Not on file    Emotionally abused: Not on file    Physically abused: Not on file    Forced sexual activity: Not on file  Other Topics Concern  . Not on file  Social History Narrative   No regular exercise   Caffeine use: yes, dt coke   Lives at home with his mother lives with him.   Wife Fraser Din recently deceased   Right-handed.    Family History  Problem Relation Age of Onset  . Diabetes Mother   . Alzheimer's disease Mother   . Diabetes Father   . Lung cancer Father        Age 44  . Stroke Father   . Heart attack Father   . Lung cancer Sister        lung  . Heart disease Maternal Grandfather   . COPD Sister   .  Heart attack Sister   . Heart disease Sister     Allergies  Allergen Reactions  . Tetracycline Itching and Rash    Medication list reviewed and updated in full in [facility].   GEN: No acute illnesses, no fevers, chills. GI: No n/v/d, eating normally Pulm: No SOB Interactive and getting along well at home.  Otherwise, ROS is as per the HPI.  Objective:   BP 120/78   Pulse (!) 117 Comment: irregular  Temp (!) 97.4 F (36.3 C) (Oral)   Ht 5\' 11"  (1.803 m)   Wt 197 lb 12 oz (89.7 kg)   BMI 27.58 kg/m   GEN: WDWN, NAD, Non-toxic, A & O x 3 HEENT: Atraumatic, Normocephalic. Neck supple. No masses, No LAD. Ears and Nose: No external deformity. CV: RRR, No M/G/R. No JVD. No thrill. No extra heart sounds. PULM: CTA B, no wheezes, crackles, rhonchi. No retractions. No resp. distress. No accessory muscle use. EXTR: No c/c/e NEURO Normal gait.  PSYCH: Normally interactive. Conversant. Not depressed or anxious appearing.  Calm demeanor.   Laboratory and Imaging Data: Results for orders placed or performed during the hospital encounter of 94/17/40  Basic metabolic panel  Result Value Ref Range   Sodium 127 (L) 135 - 145 mmol/L   Potassium 4.9 3.5 - 5.1 mmol/L   Chloride 86 (L) 98 - 111 mmol/L   CO2 14 (L) 22 - 32 mmol/L   Glucose, Bld 468 (H) 70 - 99 mg/dL   BUN 20 8 - 23 mg/dL   Creatinine, Ser 1.99 (H) 0.61 - 1.24 mg/dL   Calcium 9.3 8.9 - 10.3 mg/dL   GFR calc non Af Amer 33 (L) >60 mL/min   GFR calc Af Amer 38 (L) >60 mL/min   Anion gap 27 (H) 5 - 15  CBC with Differential  Result Value Ref Range   WBC 7.8 4.0 - 10.5 K/uL   RBC 5.87 (H) 4.22 - 5.81 MIL/uL   Hemoglobin 17.6 (H) 13.0 - 17.0 g/dL   HCT 50.8 39.0 - 52.0 %   MCV 86.5 80.0 - 100.0 fL   MCH 30.0 26.0 - 34.0 pg   MCHC 34.6 30.0 - 36.0 g/dL   RDW 12.1 11.5 - 15.5 %   Platelets 199 150 - 400 K/uL   nRBC 0.0 0.0 - 0.2 %   Neutrophils Relative % 78 %   Neutro Abs 6.0 1.7 - 7.7 K/uL   Lymphocytes  Relative 13 %   Lymphs Abs 1.0 0.7 - 4.0 K/uL   Monocytes Relative 8 %   Monocytes Absolute 0.6 0.1 - 1.0 K/uL   Eosinophils Relative 0 %   Eosinophils Absolute 0.0 0.0 - 0.5 K/uL   Basophils Relative 0 %   Basophils Absolute 0.0 0.0 - 0.1 K/uL   Immature Granulocytes 1 %   Abs Immature Granulocytes 0.06 0.00 - 0.07 K/uL  Urinalysis, Routine w reflex microscopic  Result Value Ref Range   Color, Urine YELLOW YELLOW   APPearance CLEAR CLEAR   Specific Gravity, Urine 1.025 1.005 - 1.030   pH 5.0 5.0 - 8.0   Glucose, UA >=500 (A) NEGATIVE mg/dL   Hgb urine dipstick SMALL (A) NEGATIVE   Bilirubin Urine NEGATIVE NEGATIVE   Ketones, ur 80 (A) NEGATIVE mg/dL   Protein, ur >=300 (A) NEGATIVE mg/dL   Nitrite NEGATIVE NEGATIVE   Leukocytes,Ua NEGATIVE NEGATIVE   RBC / HPF 6-10 0 - 5 RBC/hpf   Bacteria, UA NONE SEEN NONE SEEN   Squamous Epithelial / LPF 0-5 0 - 5  POCT I-Stat EG7  Result Value Ref Range   pH, Ven 7.197 (LL) 7.250 - 7.430   pCO2, Ven 43.1 (L) 44.0 - 60.0 mmHg   pO2, Ven 40.0 32.0 - 45.0 mmHg   Bicarbonate 16.7 (L) 20.0 - 28.0 mmol/L   TCO2 18 (L) 22 - 32 mmol/L   O2 Saturation 63.0 %   Acid-base deficit 11.0 (H) 0.0 - 2.0 mmol/L  Sodium 125 (L) 135 - 145 mmol/L   Potassium 5.1 3.5 - 5.1 mmol/L   Calcium, Ion 1.13 (L) 1.15 - 1.40 mmol/L   HCT 53.0 (H) 39.0 - 52.0 %   Hemoglobin 18.0 (H) 13.0 - 17.0 g/dL   Patient temperature HIDE    Sample type VENOUS      Assessment and Plan:   Viral gastroenteritis  Chest pain, unspecified type - Plan: EKG 12-Lead  Vomiting, intractability of vomiting not specified, presence of nausea not specified, unspecified vomiting type - Plan: ondansetron (ZOFRAN-ODT) disintegrating tablet 4 mg  Hypogonadism male - Plan: testosterone cypionate (DEPOTESTOSTERONE CYPIONATE) injection 150 mg  Esophageal dysphagia - Plan: omeprazole (PRILOSEC) 40 MG capsule  Gastroesophageal reflux disease, esophagitis presence not specified - Plan:  omeprazole (PRILOSEC) 40 MG capsule  Medical non-compliance  EKG: Regular rhythm, tachycardic to 110.  No ST elevation or depression.  This does not appear to be any different compared to his most recent EKG that was done in the emergency room with the exception of the increased heart rate.  Acute infectious gastroenteritis, recommended bland food and liquids.  Unfortunately, he has discontinued all of his medication with the exception of his insulin.  Thankfully his blood sugars are good, but it is almost impossible to know exactly what may be driven his generalized fatigue.  Certainly withdrawal from testosterone could do this.  I think also stopping all of his antidepressants.  I am asking him to gradually get back on his medication.  If he has symptoms from restarting them all, and we will have to do these in a graduated fashion  Patient Instructions   Venlafaxine - take 1 tablet for 1 week, then increase to 2 tablets on 2nd week, then increase to 3 tablets.  With wellbutrin, start with 1 tablet in the morning for 1 week, then increase to twice a day on the 2nd week  Gabapentin, start with 1 tablet at night for 1 week, then increase to 2 tablets at night on the 2nd week  Everything else, ok to start back like you were    Follow-up: No follow-ups on file.  Meds ordered this encounter  Medications  . ondansetron (ZOFRAN-ODT) disintegrating tablet 4 mg  . testosterone cypionate (DEPOTESTOSTERONE CYPIONATE) injection 150 mg  . omeprazole (PRILOSEC) 40 MG capsule    Sig: Take 1 capsule (40 mg total) by mouth 2 (two) times daily before a meal.    Dispense:  180 capsule    Refill:  1  . ondansetron (ZOFRAN) 4 MG tablet    Sig: Take 1 tablet (4 mg total) by mouth every 8 (eight) hours as needed for nausea or vomiting.    Dispense:  30 tablet    Refill:  1   Orders Placed This Encounter  Procedures  . EKG 12-Lead    Signed,  Ladona Rosten T. Gurney Balthazor, MD   Outpatient Encounter  Medications as of 08/21/2018  Medication Sig  . acetaminophen (QC ACETAMINOPHEN 8HR ARTH PAIN) 650 MG CR tablet Take 650 mg by mouth every 8 (eight) hours as needed for pain.  [name] Kitchen erythromycin ophthalmic ointment Place a 1/2 inch ribbon of ointment into the lower eyelid.  [name] Kitchen insulin NPH Human (HUMULIN N,NOVOLIN N) 100 UNIT/ML injection Inject 0.25 mLs (25 Units total) into the skin 2 (two) times daily before a meal.  . insulin regular (NOVOLIN R,HUMULIN R) 100 units/mL injection Inject 0.2 mLs (20 Units total) into the skin 2 (two) times daily before a meal.  .  omeprazole (PRILOSEC) 40 MG capsule Take 1 capsule (40 mg total) by mouth 2 (two) times daily before a meal.  . ondansetron (ZOFRAN) 4 MG tablet Take 1 tablet (4 mg total) by mouth every 8 (eight) hours as needed for nausea or vomiting.  . prednisoLONE acetate (PRED FORTE) 1 % ophthalmic suspension INSTILL 1 DROP INTO LEFT EYE 4 TIMES A DAY  . Semaglutide,0.25 or 0.5MG /DOS, (OZEMPIC, 0.25 OR 0.5 MG/DOSE,) 2 MG/1.5ML SOPN Inject 0.5 mg into the skin once a week.  . [DISCONTINUED] omeprazole (PRILOSEC) 40 MG capsule TAKE 1 CAPSULE (40 MG TOTAL) BY MOUTH 2 (TWO) TIMES DAILY BEFORE A MEAL.  . [DISCONTINUED] ondansetron (ZOFRAN) 4 MG tablet Take 1 tablet (4 mg total) by mouth every 8 (eight) hours as needed for nausea or vomiting.  . Ascorbic Acid (VITAMIN C) 1000 MG tablet Take 1,000 mg by mouth daily.  [name] Kitchen aspirin EC 81 MG tablet Take 81 mg by mouth at bedtime.  [name] Kitchen atenolol (TENORMIN) 50 MG tablet Take 1 tablet (50 mg total) by mouth daily. (Patient not taking: Reported on 08/21/2018)  . buPROPion (WELLBUTRIN SR) 150 MG 12 hr tablet TAKE 1 TABLET BY MOUTH TWICE A DAY (Patient not taking: Reported on 08/21/2018)  . cholecalciferol (VITAMIN D) 1000 UNITS tablet Take 1,000 Units by mouth daily.    . colchicine 0.6 MG tablet Take 1 tablet (0.6 mg total) by mouth 2 (two) times daily. (Patient not taking: Reported on 08/21/2018)  . donepezil (ARICEPT) 10 MG  tablet Take 1 tablet (10 mg total) by mouth at bedtime. (Patient not taking: Reported on 08/21/2018)  . doxazosin (CARDURA) 8 MG tablet Take 0.5 tablets (4 mg total) by mouth at bedtime. (Patient not taking: Reported on 08/21/2018)  . finasteride (PROSCAR) 5 MG tablet Take 1 tablet (5 mg total) by mouth daily. (Patient not taking: Reported on 08/21/2018)  . fluticasone (FLONASE) 50 MCG/ACT nasal spray INHALE 2 SPRAYS INTO EACH NOSTRIL EVERY DAY AS NEEDED FOR CONGESTION (Patient not taking: Reported on 08/21/2018)  . gabapentin (NEURONTIN) 300 MG capsule Take 2 capsules (600 mg total) by mouth at bedtime. (Patient not taking: Reported on 08/21/2018)  . ibuprofen (ADVIL,MOTRIN) 200 MG tablet Take 200-400 mg by mouth every 6 (six) hours as needed for headache (pain).  . indomethacin (INDOCIN) 50 MG capsule Take 1 capsule (50 mg total) by mouth 2 (two) times daily with a meal. (Patient not taking: Reported on 08/21/2018)  . lisinopril (PRINIVIL,ZESTRIL) 10 MG tablet Take 1 tablet (10 mg total) by mouth daily. (Patient not taking: Reported on 08/21/2018)  . memantine (NAMENDA) 10 MG tablet Take 1 tablet (10 mg total) by mouth 2 (two) times daily. (Patient not taking: Reported on 08/21/2018)  . sildenafil (REVATIO) 20 MG tablet Generic Revatio / Sildanefil 20 mg. 2 - 5 tabs 30 mins prior to intercourse. (Patient not taking: Reported on 08/21/2018)  . simvastatin (ZOCOR) 40 MG tablet Take 1 tablet (40 mg total) by mouth at bedtime. (Patient not taking: Reported on 08/21/2018)  . testosterone cypionate (DEPOTESTOSTERONE CYPIONATE) 200 MG/ML injection Inject 150 mg every 14 days (Patient not taking: Reported on 08/21/2018)  . valACYclovir (VALTREX) 1000 MG tablet Take 1,000 mg by mouth 3 (three) times daily.  [name] Kitchen venlafaxine XR (EFFEXOR-XR) 75 MG 24 hr capsule TAKE ONE CAPSULE (75 MG) BY MOUTH EVERY MORNING AND TWO CAPSULES (150 MG) AT NIGHT  . [DISCONTINUED] ampicillin (PRINCIPEN) 500 MG capsule TAKE 1 CAPSULE DAILY WITH  FOOD ORALLY 30 DAYS  . [DISCONTINUED]  HYDROcodone-acetaminophen (NORCO) 5-325 MG tablet Take 1 tablet by mouth every 6 (six) hours as needed for moderate pain. (Patient not taking: Reported on 08/21/2018)  . [DISCONTINUED] permethrin (ELIMITE) 5 % cream Apply to all over body from neck to toes.  Sleep with it on and wash off in 12 hours. Repeat in 2 weeks. (Patient not taking: Reported on 08/21/2018)   Facility-Administered Encounter Medications as of 08/21/2018  Medication  . [COMPLETED] ondansetron (ZOFRAN-ODT) disintegrating tablet 4 mg  . testosterone cypionate (DEPOTESTOSTERONE CYPIONATE) injection 150 mg  . testosterone cypionate (DEPOTESTOSTERONE CYPIONATE) injection 150 mg  . [COMPLETED] testosterone cypionate (DEPOTESTOSTERONE CYPIONATE) injection 150 mg

## 2018-08-21 NOTE — Patient Instructions (Addendum)
  Venlafaxine - take 1 tablet for 1 week, then increase to 2 tablets on 2nd week, then increase to 3 tablets.  With wellbutrin, start with 1 tablet in the morning for 1 week, then increase to twice a day on the 2nd week  Gabapentin, start with 1 tablet at night for 1 week, then increase to 2 tablets at night on the 2nd week  Everything else, ok to start back like you were

## 2018-08-21 NOTE — Telephone Encounter (Signed)
Pt called to get an appt for today to get T-inj shot and discuss ongoing vomiting with Dr Lorelei Pont. This has been going on 2-3 weeks, he can only drink water and pedialyte- he is about 190lbs now. He said he has vomited today but no other symptoms. He stopped taking venlafaxine early April and last T-inj was 4/15. He is concerned he is having withdrawal.  Please call to advise, [phone]

## 2018-08-22 DIAGNOSIS — H532 Diplopia: Secondary | ICD-10-CM | POA: Diagnosis not present

## 2018-08-22 DIAGNOSIS — Z961 Presence of intraocular lens: Secondary | ICD-10-CM | POA: Diagnosis not present

## 2018-08-22 DIAGNOSIS — H4922 Sixth [abducent] nerve palsy, left eye: Secondary | ICD-10-CM | POA: Diagnosis not present

## 2018-08-22 DIAGNOSIS — B0239 Other herpes zoster eye disease: Secondary | ICD-10-CM | POA: Diagnosis not present

## 2018-08-24 ENCOUNTER — Other Ambulatory Visit: Payer: Self-pay | Admitting: Surgery

## 2018-08-24 DIAGNOSIS — H4922 Sixth [abducent] nerve palsy, left eye: Secondary | ICD-10-CM

## 2018-09-01 DIAGNOSIS — H4922 Sixth [abducent] nerve palsy, left eye: Secondary | ICD-10-CM | POA: Diagnosis not present

## 2018-09-05 ENCOUNTER — Ambulatory Visit: Payer: Medicare Other

## 2018-09-05 DIAGNOSIS — H532 Diplopia: Secondary | ICD-10-CM | POA: Diagnosis not present

## 2018-09-05 DIAGNOSIS — B0239 Other herpes zoster eye disease: Secondary | ICD-10-CM | POA: Diagnosis not present

## 2018-09-05 DIAGNOSIS — H4922 Sixth [abducent] nerve palsy, left eye: Secondary | ICD-10-CM | POA: Diagnosis not present

## 2018-09-05 DIAGNOSIS — Z961 Presence of intraocular lens: Secondary | ICD-10-CM | POA: Diagnosis not present

## 2018-09-06 ENCOUNTER — Ambulatory Visit (INDEPENDENT_AMBULATORY_CARE_PROVIDER_SITE_OTHER): Payer: Medicare Other

## 2018-09-06 ENCOUNTER — Other Ambulatory Visit: Payer: Self-pay

## 2018-09-06 DIAGNOSIS — E291 Testicular hypofunction: Secondary | ICD-10-CM | POA: Diagnosis not present

## 2018-09-06 MED ORDER — TESTOSTERONE CYPIONATE 200 MG/ML IM SOLN
150.0000 mg | Freq: Once | INTRAMUSCULAR | Status: AC
  Administered 2018-09-06: 09:00:00 150 mg via INTRAMUSCULAR

## 2018-09-06 NOTE — Progress Notes (Signed)
Per orders of Dr. Lorelei Pont, injection of testosterone given by Brenton Grills. Patient tolerated injection well.

## 2018-09-11 ENCOUNTER — Encounter: Payer: Self-pay | Admitting: Internal Medicine

## 2018-09-11 ENCOUNTER — Ambulatory Visit (INDEPENDENT_AMBULATORY_CARE_PROVIDER_SITE_OTHER): Payer: Medicare Other | Admitting: Internal Medicine

## 2018-09-11 ENCOUNTER — Other Ambulatory Visit: Payer: Self-pay

## 2018-09-11 DIAGNOSIS — E781 Pure hyperglyceridemia: Secondary | ICD-10-CM

## 2018-09-11 DIAGNOSIS — E1165 Type 2 diabetes mellitus with hyperglycemia: Secondary | ICD-10-CM | POA: Diagnosis not present

## 2018-09-11 DIAGNOSIS — E1142 Type 2 diabetes mellitus with diabetic polyneuropathy: Secondary | ICD-10-CM

## 2018-09-11 DIAGNOSIS — E669 Obesity, unspecified: Secondary | ICD-10-CM

## 2018-09-11 DIAGNOSIS — IMO0002 Reserved for concepts with insufficient information to code with codable children: Secondary | ICD-10-CM

## 2018-09-11 NOTE — Patient Instructions (Addendum)
Please continue: -Ozempic 0.5 mg weekly -Also: Type of insulin Breakfast Lunch Dinner  Humulin R 20 - 20  Humulin N 20 - 20   Please return in 3-4 months with your sugar log.

## 2018-09-11 NOTE — Progress Notes (Signed)
Patient ID: [name], male   DOB: 01-May-1945, 73 y.o.   [name]: [id number]  Patient location: Home My location: Office  Referring Provider: [name], MD  I connected with the patient on 09/11/18 at  1:01 PM EDT by telephone and verified that I am speaking with the correct person.   I discussed the limitations of evaluation and management by telephone and the availability of in person appointments. The patient expressed understanding and agreed to proceed.   Details of the encounter are shown below.  HPI: [name] is a 73 y.o.-year-old male, presenting for f/u for DM2, dx July 17, 1995, insulin-dependent since Jul 16, 2008, uncontrolled, with complications (CKD stage 3, PN, ED, TIA). Last visit 4.5 months ago.  He had shingles in L eye. Occasional diplopia. May need prism glasses.  Last hemoglobin A1c was: Lab Results  Component Value Date   HGBA1C 12.5 (H) 04/13/2018   HGBA1C 11.9 (H) 12/29/2017   HGBA1C 12.5 (A) 10/14/2017   He was on:  U500 insulin: - draw up to 20 units on the syringe before b'fast (100 units) - draw up to 10 (not 12) units on the syringe before lunch (60 units) - draw up to 20 (sometimes 25) units on the syringe before dinner (100 units)  He is now on Type of: insulin Breakfast Lunch Dinner  Humulin R 35 >> 40 >> 20  35 >> 40 >> 20  Humulin N 35 >> 40 >> 20  35 >> 40 >> 45 >> 20  He is still missing many insulin doses.  -Ozempic 0.5 mg weekly-started 04/2018 - no GI SEs  Pt checks his sugars twice a week- he cannot check more often 2/2 pain in fingers from neuropathy: - am: 80, 165-394 >> 72, 177-400 >> 300s >> 130-140 - 2h after b'fast: n/c  - before lunch: n/c >> 80, 114-289 >> 80s-460 >> 160 - 2h after lunch: n/c >> 300s >> n/c - before dinner: 364 >> 198, 335 >> n/c - 2h after dinner: n/c - bedtime: 80, 137-399 >> 200-300s >> 135-150 - nighttime: 60s >> n/c >> 286 >> n/c >> 78 Lowest sugar was 72 >> 80s >> 78.  he has hypoglycemia awareness at  100. Highest sugar was 460 >> 240.  Pt's meals are: - Breakfast: 3 pancakes with diet syrup - Lunch: tuna or cheese sandwich + chips - Dinner: veggies + starch - Snacks: 4-5 x a day  -+ CKD, last BUN/creatinine:  Lab Results  Component Value Date   BUN 20 08/04/2018   CREATININE 1.99 (H) 08/04/2018  ACR on 12/12/2012.  On lisinopril. -+ HL; last set of lipids: Lab Results  Component Value Date   CHOL 125 12/29/2017   HDL 33.10 (L) 12/29/2017   LDLCALC 43 04/24/2009   LDLDIRECT 103.0 12/29/2017   TRIG 345.0 (H) 12/29/2017   CHOLHDL 4 12/29/2017  On  Zocor 40. - last eye exam 08/2018: No DR; [facility].  He has a history of cataract surgeries. - + numbness and tingling in his feet.  On Wellbutrin + Namenda + Aricept for Alzheimer dementia.  Of note, he had a very low vitamin B12, under 200, at last check by PCP.  B12 supplementation.  His wife died in 07/16/2017 - PE.  ROS: Constitutional: no weight gain/+ weight loss, no fatigue, no subjective hyperthermia, no subjective hypothermia Eyes: no blurry vision, no xerophthalmia ENT: no sore throat, no nodules palpated in neck, no dysphagia, no odynophagia, no hoarseness Cardiovascular: no CP/no  SOB/no palpitations/no leg swelling Respiratory: no cough/no SOB/no wheezing Gastrointestinal: no N/no V/no D/no C/no acid reflux Musculoskeletal: no muscle aches/no joint aches Skin: no rashes, no hair loss Neurological: no tremors/+ numbness/+ tingling/no dizziness + pbs with erections  I reviewed pt's medications, allergies, PMH, social hx, family hx, and changes were documented in the history of present illness. Otherwise, unchanged from my initial visit note.  Past Medical History:  Diagnosis Date  . Allergic rhinitis due to pollen   . Chronic kidney disease, stage III (moderate) ([place]) 12/19/2012  . Depression   . Diabetes mellitus ([place])   . Diverticulosis   . Erectile dysfunction associated with type 2 diabetes mellitus  ([place]) 05/28/2015  . Former very heavy cigarette smoker (more than 40 per day) 10/07/2014  . GERD (gastroesophageal reflux disease)   . Gout   . Hyperlipidemia   . Hypertension   . Hypogonadism male 06/14/2012  . Iron deficiency   . Memory loss   . Osteoarthritis   . Personal history of colonic polyps    1996   Past Surgical History:  Procedure Laterality Date  . CARDIAC CATHETERIZATION  11/2011   [facility]  . CHOLECYSTECTOMY  1980  . ESOPHAGOGASTRODUODENOSCOPY (EGD) WITH PROPOFOL N/A 09/27/2017   Procedure: ESOPHAGOGASTRODUODENOSCOPY (EGD) WITH PROPOFOL;  Surgeon: Lin Landsman, MD;  Location: [place];  Service: Gastroenterology;  Laterality: N/A;  . EYE SURGERY Right 07/29/2016   cataract - Dr. Manuella Ghazi  . EYE SURGERY Left 08/23/2106   cataract - Dr. Manuella Ghazi  . TOTAL HIP ARTHROPLASTY  2009   Dr. Gladstone Lighter  . TOTAL HIP ARTHROPLASTY     Social History   Socioeconomic History  . Marital status: Widowed    Spouse name: Not on file  . Number of children: 4  . Years of education: 12th  . Highest education level: Not on file  Occupational History  . Occupation: Counsellor  . 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 Financial resource strain: Not on file  . Food insecurity:    Worry: Not on file    Inability: Not on file  . Transportation needs:    Medical: Not on file    Non-medical: Not on file  Tobacco Use  . Smoking status: Former Smoker    Packs/day: 3.00    Years: 35.00    Pack years: 105.00    Types: Cigarettes    Last attempt to quit: 04/12/2000    Years since quitting: 18.4  . Smokeless tobacco: Never Used  Substance and Sexual Activity  . Alcohol use: No  . Drug use: No  . Sexual activity: Never  Lifestyle  . Physical activity:    Days per week: Not on file    Minutes per session: Not on file  . Stress: Not on file  Relationships  . Social connections:    Talks on phone: Not on file    Gets together: Not on file     Attends religious service: Not on file    Active member of club or organization: Not on file    Attends meetings of clubs or organizations: Not on file    Relationship status: Not on file  . Intimate partner violence:    Fear of current or ex partner: Not on file    Emotionally abused: Not on file    Physically abused: Not on file    Forced sexual activity: Not on file  Other Topics Concern  . Not  on file  Social History Narrative   No regular exercise   Caffeine use: yes, dt coke   Lives at home with his mother lives with him.   Wife Fraser Din recently deceased   Right-handed.   Current Outpatient Medications on File Prior to Visit  Medication Sig Dispense Refill  . acetaminophen (QC ACETAMINOPHEN 8HR ARTH PAIN) 650 MG CR tablet Take 650 mg by mouth every 8 (eight) hours as needed for pain.    . Ascorbic Acid (VITAMIN C) 1000 MG tablet Take 1,000 mg by mouth daily.    [name] Kitchen aspirin EC 81 MG tablet Take 81 mg by mouth at bedtime.    [name] Kitchen atenolol (TENORMIN) 50 MG tablet Take 1 tablet (50 mg total) by mouth daily. (Patient not taking: Reported on 08/21/2018) 90 tablet 3  . buPROPion (WELLBUTRIN SR) 150 MG 12 hr tablet TAKE 1 TABLET BY MOUTH TWICE A DAY (Patient not taking: Reported on 08/21/2018) 180 tablet 1  . cholecalciferol (VITAMIN D) 1000 UNITS tablet Take 1,000 Units by mouth daily.      . colchicine 0.6 MG tablet Take 1 tablet (0.6 mg total) by mouth 2 (two) times daily. (Patient not taking: Reported on 08/21/2018) 60 tablet 2  . donepezil (ARICEPT) 10 MG tablet Take 1 tablet (10 mg total) by mouth at bedtime. (Patient not taking: Reported on 08/21/2018) 90 tablet 3  . doxazosin (CARDURA) 8 MG tablet Take 0.5 tablets (4 mg total) by mouth at bedtime. (Patient not taking: Reported on 08/21/2018) 45 tablet 3  . erythromycin ophthalmic ointment Place a 1/2 inch ribbon of ointment into the lower eyelid. 10 g 0  . finasteride (PROSCAR) 5 MG tablet Take 1 tablet (5 mg total) by mouth daily. (Patient not  taking: Reported on 08/21/2018) 30 tablet 2  . fluticasone (FLONASE) 50 MCG/ACT nasal spray INHALE 2 SPRAYS INTO EACH NOSTRIL EVERY DAY AS NEEDED FOR CONGESTION (Patient not taking: Reported on 08/21/2018) 48 g 3  . gabapentin (NEURONTIN) 300 MG capsule Take 2 capsules (600 mg total) by mouth at bedtime. (Patient not taking: Reported on 08/21/2018) 90 capsule 1  . ibuprofen (ADVIL,MOTRIN) 200 MG tablet Take 200-400 mg by mouth every 6 (six) hours as needed for headache (pain).    . indomethacin (INDOCIN) 50 MG capsule Take 1 capsule (50 mg total) by mouth 2 (two) times daily with a meal. (Patient not taking: Reported on 08/21/2018) 30 capsule 0  . insulin NPH Human (HUMULIN N,NOVOLIN N) 100 UNIT/ML injection Inject 0.25 mLs (25 Units total) into the skin 2 (two) times daily before a meal. 10 mL   . insulin regular (NOVOLIN R,HUMULIN R) 100 units/mL injection Inject 0.2 mLs (20 Units total) into the skin 2 (two) times daily before a meal. 10 mL   . lisinopril (PRINIVIL,ZESTRIL) 10 MG tablet Take 1 tablet (10 mg total) by mouth daily. (Patient not taking: Reported on 08/21/2018) 90 tablet 1  . memantine (NAMENDA) 10 MG tablet Take 1 tablet (10 mg total) by mouth 2 (two) times daily. (Patient not taking: Reported on 08/21/2018) 180 tablet 3  . omeprazole (PRILOSEC) 40 MG capsule Take 1 capsule (40 mg total) by mouth 2 (two) times daily before a meal. 180 capsule 1  . ondansetron (ZOFRAN) 4 MG tablet Take 1 tablet (4 mg total) by mouth every 8 (eight) hours as needed for nausea or vomiting. 30 tablet 1  . prednisoLONE acetate (PRED FORTE) 1 % ophthalmic suspension INSTILL 1 DROP INTO LEFT EYE 4 TIMES A  DAY    . Semaglutide,0.25 or 0.5MG /DOS, (OZEMPIC, 0.25 OR 0.5 MG/DOSE,) 2 MG/1.5ML SOPN Inject 0.5 mg into the skin once a week. 2 pen 5  . sildenafil (REVATIO) 20 MG tablet Generic Revatio / Sildanefil 20 mg. 2 - 5 tabs 30 mins prior to intercourse. (Patient not taking: Reported on 08/21/2018) 10 tablet 11  .  simvastatin (ZOCOR) 40 MG tablet Take 1 tablet (40 mg total) by mouth at bedtime. (Patient not taking: Reported on 08/21/2018) 90 tablet 3  . testosterone cypionate (DEPOTESTOSTERONE CYPIONATE) 200 MG/ML injection Inject 150 mg every 14 days (Patient not taking: Reported on 08/21/2018) 10 mL 1  . valACYclovir (VALTREX) 1000 MG tablet Take 1,000 mg by mouth 3 (three) times daily.    [name] Kitchen venlafaxine XR (EFFEXOR-XR) 75 MG 24 hr capsule TAKE ONE CAPSULE (75 MG) BY MOUTH EVERY MORNING AND TWO CAPSULES (150 MG) AT NIGHT 270 capsule 1   Current Facility-Administered Medications on File Prior to Visit  Medication Dose Route Frequency Provider Last Rate Last Dose  . testosterone cypionate (DEPOTESTOSTERONE CYPIONATE) injection 150 mg  150 mg Intramuscular Q14 Days Copland, Spencer, MD   150 mg at 04/19/18 1128  . testosterone cypionate (DEPOTESTOSTERONE CYPIONATE) injection 150 mg  150 mg Intramuscular Q14 Days Copland, Spencer, MD   150 mg at 05/31/18 1415   Allergies  Allergen Reactions  . Tetracycline Itching and Rash   Family History  Problem Relation Age of Onset  . Diabetes Mother   . Alzheimer's disease Mother   . Diabetes Father   . Lung cancer Father        Age 48  . Stroke Father   . Heart attack Father   . Lung cancer Sister        lung  . Heart disease Maternal Grandfather   . COPD Sister   . Heart attack Sister   . Heart disease Sister     PE: There were no vitals taken for this visit. There is no height or weight on file to calculate BMI. Wt Readings from Last 3 Encounters:  08/21/18 197 lb 12 oz (89.7 kg)  08/04/18 205 lb (93 kg)  08/01/18 205 lb (93 kg)   Constitutional:  in NAD  The physical exam was not performed (telephone visit).  ASSESSMENT: 1. DM2, insulin-dependent, uncontrolled, with complications - noncompliance with visits, insulin doses, CBG checks - CKD stage 3 - PN - ED - TIA  2. HTG  3.  Obesity  PLAN:  1. Patient with history of longstanding,  uncontrolled, type 2 diabetes, on basal-bolus insulin regimen with Humulin N and R due to price.  At last visit, he was still missing many insulin injections and his HbA1c was extremely high, at 12.5%.  We increase his nighttime NPH dose and also tried to add Ozempic. I gave him information about getting a freestyle libre CGM in the past but he did not obtain this. -At this visit, sugars are drastically better compared to before most likely contributed to by Ozempic and his recent GI upset.  He does not feel that the 2 are related.  He is eating much less compared to before. I strongly advised him to continue to continue to do exactly what he is doing now.  Sugars are out of close to goal, with the exception of the ones before lunch which are slightly higher.  However, due to the impressive improvement in his blood sugar compared to before, I advised him not to change his regimen.  He is using 50% of the original insulin doses, but we do not need to increase for now. - I advised him to: Patient Instructions  Please continue: -Ozempic 0.5 mg weekly -Also: Type of insulin Breakfast Lunch Dinner  Humulin R 20 - 20  Humulin N 20 - 20   Please return in 3-4 months with your sugar log.   - we will check the HbA1c when he returns to the clinic - continue checking sugars at different times of the day - check 3x a day, rotating checks - advised for yearly eye exams >> he is UTD - Return to clinic in 3-4 mo with sugar log   2. HTG - Reviewed latest lipid panel from 12/2017: TG still high, LDL above goal, HDL low Lab Results  Component Value Date   CHOL 125 12/29/2017   HDL 33.10 (L) 12/29/2017   LDLCALC 43 04/24/2009   LDLDIRECT 103.0 12/29/2017   TRIG 345.0 (H) 12/29/2017   CHOLHDL 4 12/29/2017  - Continues the statin without side effects. -At last visit we again discussed about cutting down fatty foods and sugary snacks  3.  Obesity -Continue Ozempic which should also help with weight loss -  lost 24 lbs since last OV! - he was initially sick and could not eat  - time spent with the patient: 12 min, of which >50% was spent in obtaining information about his symptoms, reviewing his previous labs, evaluations, and treatments, counseling him about his conditions (please see the discussed topics above), and developing a plan to further investigate and treat them; he had a number of questions which I addressed.  Philemon Kingdom, MD PhD [facility]

## 2018-09-19 ENCOUNTER — Ambulatory Visit (INDEPENDENT_AMBULATORY_CARE_PROVIDER_SITE_OTHER): Payer: Medicare Other

## 2018-09-19 DIAGNOSIS — E291 Testicular hypofunction: Secondary | ICD-10-CM

## 2018-09-19 MED ORDER — TESTOSTERONE CYPIONATE 200 MG/ML IM SOLN
150.0000 mg | Freq: Once | INTRAMUSCULAR | Status: AC
  Administered 2018-09-19: 150 mg via INTRAMUSCULAR

## 2018-09-19 NOTE — Progress Notes (Signed)
Per orders of Dr. Diona Browner as Dr. Lorelei Pont is out of office today, injection of testosterone 150mg  IM (0.61ml) given by Darin Engels.  Administered deep IM to right ventrogluteal.  Patient tolerated injection well.

## 2018-09-22 ENCOUNTER — Telehealth: Payer: Self-pay

## 2018-09-22 NOTE — Telephone Encounter (Signed)
Noted  

## 2018-09-22 NOTE — Telephone Encounter (Signed)
Received paperwork from [facility] for CGM however last chart notes do not show patient checking blood sugar 4 times a day and therefore is not approved for CGM.

## 2018-10-03 ENCOUNTER — Other Ambulatory Visit: Payer: Self-pay

## 2018-10-03 ENCOUNTER — Ambulatory Visit (INDEPENDENT_AMBULATORY_CARE_PROVIDER_SITE_OTHER): Payer: Medicare Other | Admitting: *Deleted

## 2018-10-03 DIAGNOSIS — E291 Testicular hypofunction: Secondary | ICD-10-CM | POA: Diagnosis not present

## 2018-10-03 NOTE — Progress Notes (Signed)
Per orders of Dr. Lorelei Pont, injection of testosterone cypionate given by Tammi Sou. Patient tolerated injection well.

## 2018-10-06 DIAGNOSIS — M5136 Other intervertebral disc degeneration, lumbar region: Secondary | ICD-10-CM | POA: Diagnosis not present

## 2018-10-06 DIAGNOSIS — M21372 Foot drop, left foot: Secondary | ICD-10-CM | POA: Diagnosis not present

## 2018-10-09 ENCOUNTER — Telehealth: Payer: Self-pay

## 2018-10-09 NOTE — Telephone Encounter (Signed)
Fax received from [facility] that referral for CGM has been canceled due to patient not responding.

## 2018-10-11 ENCOUNTER — Telehealth: Payer: Self-pay | Admitting: Nurse Practitioner

## 2018-10-11 ENCOUNTER — Other Ambulatory Visit: Payer: Self-pay | Admitting: Orthopedic Surgery

## 2018-10-11 DIAGNOSIS — M21372 Foot drop, left foot: Secondary | ICD-10-CM

## 2018-10-11 NOTE — Telephone Encounter (Signed)
Phone call to patient to verify medication list and allergies for myelogram procedure. Pt instructed to hold Effexor for 48hrs prior to myelogram appointment time. Pt verbalized understanding. Pre and post procedure instructions reviewed with pt.

## 2018-10-17 ENCOUNTER — Ambulatory Visit (INDEPENDENT_AMBULATORY_CARE_PROVIDER_SITE_OTHER): Payer: Medicare Other

## 2018-10-17 DIAGNOSIS — E291 Testicular hypofunction: Secondary | ICD-10-CM

## 2018-10-17 MED ORDER — TESTOSTERONE CYPIONATE 200 MG/ML IM SOLN
150.0000 mg | INTRAMUSCULAR | Status: DC
  Administered 2018-10-17: 150 mg via INTRAMUSCULAR

## 2018-10-17 NOTE — Discharge Instructions (Signed)
Myelogram Discharge Instructions  1. Go home and rest quietly for the next 24 hours.  It is important to lie flat for the next 24 hours.  Get up only to go to the restroom.  You may lie in the bed or on a couch on your back, your stomach, your left side or your right side.  You may have one pillow under your head.  You may have pillows between your knees while you are on your side or under your knees while you are on your back.  2. DO NOT drive today.  Recline the seat as far back as it will go, while still wearing your seat belt, on the way home.  3. You may get up to go to the bathroom as needed.  You may sit up for 10 minutes to eat.  You may resume your normal diet and medications unless otherwise indicated.  Drink lots of extra fluids today and tomorrow.  4. The incidence of headache, nausea, or vomiting is about 5% (one in 20 patients).  If you develop a headache, lie flat and drink plenty of fluids until the headache goes away.  Caffeinated beverages may be helpful.  If you develop severe nausea and vomiting or a headache that does not go away with flat bed rest, call [phone].  5. You may resume normal activities after your 24 hours of bed rest is over; however, do not exert yourself strongly or do any heavy lifting tomorrow. If when you get up you have a headache when standing, go back to bed and force fluids for another 24 hours.  6. Call your physician for a follow-up appointment.  The results of your myelogram will be sent directly to your physician by the following day.  7. If you have any questions or if complications develop after you arrive home, please call [phone].  Discharge instructions have been explained to the patient.  The patient, or the person responsible for the patient, fully understands these instructions.  YOU  MAY RESTART YOUR [facility] TOMORROW 10/19/2018 AT 08:30AM.

## 2018-10-17 NOTE — Progress Notes (Signed)
Per orders of Dr. Diona Browner, injection of Testosterone 150mg  IM (0.34ml) given by Diamond Nickel, RN administered deep IM to Right Ventrogluteal.  Patient tolerated injection well.

## 2018-10-18 ENCOUNTER — Ambulatory Visit
Admission: RE | Admit: 2018-10-18 | Discharge: 2018-10-18 | Disposition: A | Discharge status: Home / Self Care | Payer: Medicare Other | Source: Ambulatory Visit | Attending: Orthopedic Surgery | Admitting: Orthopedic Surgery

## 2018-10-18 ENCOUNTER — Other Ambulatory Visit: Payer: Self-pay

## 2018-10-18 DIAGNOSIS — M21372 Foot drop, left foot: Secondary | ICD-10-CM

## 2018-10-18 DIAGNOSIS — M48061 Spinal stenosis, lumbar region without neurogenic claudication: Secondary | ICD-10-CM | POA: Diagnosis not present

## 2018-10-18 MED ORDER — IOPAMIDOL (ISOVUE-M 200) INJECTION 41%
20.0000 mL | Freq: Once | INTRAMUSCULAR | Status: AC
  Administered 2018-10-18: 20 mL via INTRATHECAL

## 2018-10-18 MED ORDER — DIAZEPAM 5 MG PO TABS
5.0000 mg | ORAL_TABLET | Freq: Once | ORAL | Status: AC
  Administered 2018-10-18: 08:00:00 5 mg via ORAL

## 2018-10-18 NOTE — Progress Notes (Signed)
Patient states he has been off Effexor for at least the past two days.

## 2018-10-19 DIAGNOSIS — M21372 Foot drop, left foot: Secondary | ICD-10-CM | POA: Diagnosis not present

## 2018-10-23 DIAGNOSIS — M5136 Other intervertebral disc degeneration, lumbar region: Secondary | ICD-10-CM | POA: Diagnosis not present

## 2018-10-26 ENCOUNTER — Other Ambulatory Visit: Payer: Medicare Other

## 2018-10-31 ENCOUNTER — Ambulatory Visit (INDEPENDENT_AMBULATORY_CARE_PROVIDER_SITE_OTHER): Payer: Medicare Other | Admitting: *Deleted

## 2018-10-31 ENCOUNTER — Other Ambulatory Visit: Payer: Self-pay

## 2018-10-31 DIAGNOSIS — E291 Testicular hypofunction: Secondary | ICD-10-CM | POA: Diagnosis not present

## 2018-10-31 MED ORDER — TESTOSTERONE CYPIONATE 200 MG/ML IM SOLN
200.0000 mg | Freq: Once | INTRAMUSCULAR | Status: AC
  Administered 2018-10-31: 200 mg via INTRAMUSCULAR

## 2018-10-31 NOTE — Progress Notes (Addendum)
Per orders of Dr. Diona Browner (for Copland), injection of testosterone cypoinate given by [facility] Patient tolerated injection well.

## 2018-11-06 DIAGNOSIS — E1140 Type 2 diabetes mellitus with diabetic neuropathy, unspecified: Secondary | ICD-10-CM | POA: Diagnosis not present

## 2018-11-08 DIAGNOSIS — H4922 Sixth [abducent] nerve palsy, left eye: Secondary | ICD-10-CM | POA: Diagnosis not present

## 2018-11-08 DIAGNOSIS — H5202 Hypermetropia, left eye: Secondary | ICD-10-CM | POA: Diagnosis not present

## 2018-11-08 DIAGNOSIS — B0239 Other herpes zoster eye disease: Secondary | ICD-10-CM | POA: Diagnosis not present

## 2018-11-08 DIAGNOSIS — E119 Type 2 diabetes mellitus without complications: Secondary | ICD-10-CM | POA: Diagnosis not present

## 2018-11-08 DIAGNOSIS — H532 Diplopia: Secondary | ICD-10-CM | POA: Diagnosis not present

## 2018-11-13 DIAGNOSIS — M5136 Other intervertebral disc degeneration, lumbar region: Secondary | ICD-10-CM | POA: Diagnosis not present

## 2018-11-13 DIAGNOSIS — M21372 Foot drop, left foot: Secondary | ICD-10-CM | POA: Diagnosis not present

## 2018-11-14 ENCOUNTER — Ambulatory Visit (INDEPENDENT_AMBULATORY_CARE_PROVIDER_SITE_OTHER): Payer: Medicare Other

## 2018-11-14 DIAGNOSIS — E291 Testicular hypofunction: Secondary | ICD-10-CM

## 2018-11-14 MED ORDER — TESTOSTERONE CYPIONATE 200 MG/ML IM SOLN
150.0000 mg | INTRAMUSCULAR | Status: DC
  Administered 2018-11-14: 150 mg via INTRAMUSCULAR

## 2018-11-14 NOTE — Progress Notes (Signed)
Per orders of Dr. Moody Bruins signed by Dr .Diona Browner in his abscence, injection of Testosterone injection 150 mg(0.75 ml) given by Kris Mouton. Patient tolerated injection well.

## 2018-11-28 ENCOUNTER — Ambulatory Visit (INDEPENDENT_AMBULATORY_CARE_PROVIDER_SITE_OTHER): Payer: Medicare Other

## 2018-11-28 DIAGNOSIS — E291 Testicular hypofunction: Secondary | ICD-10-CM

## 2018-11-28 MED ORDER — TESTOSTERONE CYPIONATE 200 MG/ML IM SOLN
150.0000 mg | INTRAMUSCULAR | Status: DC
  Administered 2018-11-28: 150 mg via INTRAMUSCULAR

## 2018-11-28 NOTE — Progress Notes (Signed)
Given to Dr Silvio Pate in Dr Copland's absence.  Pt given 0.40ml in right upper outer quadrant. Tolerated well. Repeat in 14 days.

## 2018-12-05 DIAGNOSIS — M5136 Other intervertebral disc degeneration, lumbar region: Secondary | ICD-10-CM | POA: Diagnosis not present

## 2018-12-12 ENCOUNTER — Ambulatory Visit (INDEPENDENT_AMBULATORY_CARE_PROVIDER_SITE_OTHER): Payer: Medicare Other

## 2018-12-12 DIAGNOSIS — E291 Testicular hypofunction: Secondary | ICD-10-CM | POA: Diagnosis not present

## 2018-12-12 MED ORDER — TESTOSTERONE CYPIONATE 200 MG/ML IM SOLN
150.0000 mg | INTRAMUSCULAR | Status: DC
  Administered 2018-12-12: 09:00:00 150 mg via INTRAMUSCULAR

## 2018-12-12 NOTE — Progress Notes (Signed)
Per orders of Dr. Diona Browner covering for Dr. Lorelei Pont today, injection of Testosterone 150mg  (7.6ml) given by Diamond Nickel, RN.    Administered to patient's Left upper outer quadrant per patients insistence not to use ventrogluteal as states that is more painful.  Patient educated on risks (Vagal nerve stimulation risk) associated with dorso glulteal use verus ventro and requested dorso gluteal instead.    Patient tolerated injection well.

## 2018-12-19 ENCOUNTER — Ambulatory Visit: Payer: Medicare Other

## 2018-12-20 ENCOUNTER — Ambulatory Visit (INDEPENDENT_AMBULATORY_CARE_PROVIDER_SITE_OTHER): Payer: Medicare Other

## 2018-12-20 DIAGNOSIS — Z23 Encounter for immunization: Secondary | ICD-10-CM | POA: Diagnosis not present

## 2018-12-22 ENCOUNTER — Other Ambulatory Visit: Payer: Self-pay | Admitting: Family Medicine

## 2018-12-22 DIAGNOSIS — M48061 Spinal stenosis, lumbar region without neurogenic claudication: Secondary | ICD-10-CM | POA: Diagnosis not present

## 2018-12-28 ENCOUNTER — Ambulatory Visit (INDEPENDENT_AMBULATORY_CARE_PROVIDER_SITE_OTHER): Payer: Medicare Other

## 2018-12-28 DIAGNOSIS — E291 Testicular hypofunction: Secondary | ICD-10-CM

## 2018-12-28 MED ORDER — TESTOSTERONE CYPIONATE 200 MG/ML IM SOLN
150.0000 mg | INTRAMUSCULAR | Status: DC
  Administered 2018-12-28 – 2019-01-25 (×2): 150 mg via INTRAMUSCULAR

## 2018-12-28 NOTE — Progress Notes (Signed)
Per orders of Dr. Damita Dunnings covering for Dr. Lorelei Pont today, injections of Testosterone 150mg  (.32ml) given by Magdalen Spatz, RN   Administered to patient's right upper outer quadrant/gluteal per patient's insistence not to use the ventrogluteal.   Patient tolerated injection well.

## 2019-01-02 ENCOUNTER — Other Ambulatory Visit: Payer: Self-pay | Admitting: Family Medicine

## 2019-01-02 DIAGNOSIS — E538 Deficiency of other specified B group vitamins: Secondary | ICD-10-CM

## 2019-01-02 DIAGNOSIS — Z79899 Other long term (current) drug therapy: Secondary | ICD-10-CM

## 2019-01-02 DIAGNOSIS — E291 Testicular hypofunction: Secondary | ICD-10-CM

## 2019-01-02 DIAGNOSIS — E119 Type 2 diabetes mellitus without complications: Secondary | ICD-10-CM

## 2019-01-02 DIAGNOSIS — R5383 Other fatigue: Secondary | ICD-10-CM

## 2019-01-02 DIAGNOSIS — E785 Hyperlipidemia, unspecified: Secondary | ICD-10-CM

## 2019-01-03 ENCOUNTER — Other Ambulatory Visit (INDEPENDENT_AMBULATORY_CARE_PROVIDER_SITE_OTHER): Payer: Medicare Other

## 2019-01-03 ENCOUNTER — Ambulatory Visit: Payer: Medicare Other

## 2019-01-03 ENCOUNTER — Ambulatory Visit (INDEPENDENT_AMBULATORY_CARE_PROVIDER_SITE_OTHER): Payer: Medicare Other

## 2019-01-03 DIAGNOSIS — E119 Type 2 diabetes mellitus without complications: Secondary | ICD-10-CM

## 2019-01-03 DIAGNOSIS — R5383 Other fatigue: Secondary | ICD-10-CM

## 2019-01-03 DIAGNOSIS — E291 Testicular hypofunction: Secondary | ICD-10-CM

## 2019-01-03 DIAGNOSIS — Z79899 Other long term (current) drug therapy: Secondary | ICD-10-CM

## 2019-01-03 DIAGNOSIS — E538 Deficiency of other specified B group vitamins: Secondary | ICD-10-CM | POA: Diagnosis not present

## 2019-01-03 DIAGNOSIS — Z0000 Encounter for general adult medical examination without abnormal findings: Secondary | ICD-10-CM

## 2019-01-03 DIAGNOSIS — E785 Hyperlipidemia, unspecified: Secondary | ICD-10-CM | POA: Diagnosis not present

## 2019-01-03 LAB — BASIC METABOLIC PANEL
BUN: 14 mg/dL (ref 6–23)
CO2: 30 mEq/L (ref 19–32)
Calcium: 9.4 mg/dL (ref 8.4–10.5)
Chloride: 89 mEq/L — ABNORMAL LOW (ref 96–112)
Creatinine, Ser: 1.3 mg/dL (ref 0.40–1.50)
GFR: 54.09 mL/min — ABNORMAL LOW (ref >60.00)
Glucose, Bld: 456 mg/dL — ABNORMAL HIGH (ref 70–99)
Potassium: 4.7 mEq/L (ref 3.5–5.1)
Sodium: 130 mEq/L — ABNORMAL LOW (ref 135–145)

## 2019-01-03 LAB — CBC WITH DIFFERENTIAL/PLATELET
Basophils Absolute: 0 10*3/uL (ref 0.0–0.1)
Basophils Relative: 0.3 % (ref 0.0–3.0)
Eosinophils Absolute: 0.1 10*3/uL (ref 0.0–0.7)
Eosinophils Relative: 1.3 % (ref 0.0–5.0)
HCT: 43.5 % (ref 39.0–52.0)
Hemoglobin: 14.9 g/dL (ref 13.0–17.0)
Lymphocytes Relative: 18.2 % (ref 12.0–46.0)
Lymphs Abs: 1.4 10*3/uL (ref 0.7–4.0)
MCHC: 34.3 g/dL (ref 30.0–36.0)
MCV: 88.2 fl (ref 78.0–100.0)
Monocytes Absolute: 0.7 10*3/uL (ref 0.1–1.0)
Monocytes Relative: 9.6 % (ref 3.0–12.0)
Neutro Abs: 5.4 10*3/uL (ref 1.4–7.7)
Neutrophils Relative %: 70.6 % (ref 43.0–77.0)
Platelets: 204 10*3/uL (ref 150.0–400.0)
RBC: 4.93 Mil/uL (ref 4.22–5.81)
RDW: 12.2 % (ref 11.5–15.5)
WBC: 7.7 10*3/uL (ref 4.0–10.5)

## 2019-01-03 LAB — HEMOGLOBIN A1C: Hgb A1c MFr Bld: 11.6 % — ABNORMAL HIGH (ref 4.6–6.5)

## 2019-01-03 LAB — MICROALBUMIN / CREATININE URINE RATIO
Creatinine,U: 50.7 mg/dL
Microalb Creat Ratio: 113.7 mg/g — ABNORMAL HIGH (ref 0.0–30.0)
Microalb, Ur: 57.6 mg/dL — ABNORMAL HIGH (ref 0.0–1.9)

## 2019-01-03 LAB — HEPATIC FUNCTION PANEL
ALT: 8 U/L (ref 0–53)
AST: 8 U/L (ref 0–37)
Albumin: 4.1 g/dL (ref 3.5–5.2)
Alkaline Phosphatase: 93 U/L (ref 39–117)
Bilirubin, Direct: 0.1 mg/dL (ref 0.0–0.3)
Total Bilirubin: 0.6 mg/dL (ref 0.2–1.2)
Total Protein: 6.8 g/dL (ref 6.0–8.3)

## 2019-01-03 LAB — VITAMIN B12: Vitamin B-12: 223 pg/mL (ref 211–911)

## 2019-01-03 LAB — LIPID PANEL
Cholesterol: 172 mg/dL (ref 0–200)
HDL: 31.4 mg/dL — ABNORMAL LOW (ref >39.00)
Total CHOL/HDL Ratio: 5
Triglycerides: 521 mg/dL — ABNORMAL HIGH (ref 0.0–149.0)

## 2019-01-03 LAB — LDL CHOLESTEROL, DIRECT: Direct LDL: 82 mg/dL

## 2019-01-03 LAB — TESTOSTERONE: Testosterone: 371.99 ng/dL (ref 300.00–890.00)

## 2019-01-03 NOTE — Addendum Note (Signed)
Addended by: Ellamae Sia on: 01/03/2019 11:25 AM   Modules accepted: Orders

## 2019-01-03 NOTE — Progress Notes (Signed)
PCP notes: none  Health Maintenance: Patient will get a Shingrix vaccine at his pharmacy in the coming days. Declined Tdap.    Abnormal Screenings: none    Patient concerns: none    Nurse concerns: none    Next PCP appt.: 9//30/2020 @ 11:20 am

## 2019-01-03 NOTE — Patient Instructions (Signed)
[name] , Thank you for taking time to come for your Medicare Wellness Visit. I appreciate your ongoing commitment to your health goals. Please review the following plan we discussed and let me know if I can assist you in the future.   Screening recommendations/referrals: Colonoscopy: up to date, completed 09/16/2010 Recommended yearly ophthalmology/optometry visit for glaucoma screening and checkup Recommended yearly dental visit for hygiene and checkup  Vaccinations: Influenza vaccine: up to date, completed 12/21/2018 Pneumococcal vaccine: series completed Tdap vaccine: declined Shingles vaccine: Will get at pharmacy in the next few days     Advanced directives: Advance directive discussed with you today. Even though you declined this today please call our office should you change your mind and we can give you the proper paperwork for you to fill out.   Conditions/risks identified: diabetes, hypertension  Next appointment: 01/10/2019 @ 11:20 am   Preventive Care 65 Years and Older, Male Preventive care refers to lifestyle choices and visits with your health care provider that can promote health and wellness. What does preventive care include?  A yearly physical exam. This is also called an annual well check.  Dental exams once or twice a year.  Routine eye exams. Ask your health care provider how often you should have your eyes checked.  Personal lifestyle choices, including:  Daily care of your teeth and gums.  Regular physical activity.  Eating a healthy diet.  Avoiding tobacco and drug use.  Limiting alcohol use.  Practicing safe sex.  Taking low doses of aspirin every day.  Taking vitamin and mineral supplements as recommended by your health care provider. What happens during an annual well check? The services and screenings done by your health care provider during your annual well check will depend on your age, overall health, lifestyle risk factors, and family  history of disease. Counseling  Your health care provider may ask you questions about your:  Alcohol use.  Tobacco use.  Drug use.  Emotional well-being.  Home and relationship well-being.  Sexual activity.  Eating habits.  History of falls.  Memory and ability to understand (cognition).  Work and work Statistician. Screening  You may have the following tests or measurements:  Height, weight, and BMI.  Blood pressure.  Lipid and cholesterol levels. These may be checked every 5 years, or more frequently if you are over 108 years old.  Skin check.  Lung cancer screening. You may have this screening every year starting at age 43 if you have a 30-pack-year history of smoking and currently smoke or have quit within the past 15 years.  Fecal occult blood test (FOBT) of the stool. You may have this test every year starting at age 25.  Flexible sigmoidoscopy or colonoscopy. You may have a sigmoidoscopy every 5 years or a colonoscopy every 10 years starting at age 61.  Prostate cancer screening. Recommendations will vary depending on your family history and other risks.  Hepatitis C blood test.  Hepatitis B blood test.  Sexually transmitted disease (STD) testing.  Diabetes screening. This is done by checking your blood sugar (glucose) after you have not eaten for a while (fasting). You may have this done every 1-3 years.  Abdominal aortic aneurysm (AAA) screening. You may need this if you are a current or former smoker.  Osteoporosis. You may be screened starting at age 91 if you are at high risk. Talk with your health care provider about your test results, treatment options, and if necessary, the need for more  tests. Vaccines  Your health care provider may recommend certain vaccines, such as:  Influenza vaccine. This is recommended every year.  Tetanus, diphtheria, and acellular pertussis (Tdap, Td) vaccine. You may need a Td booster every 10 years.  Zoster vaccine.  You may need this after age 51.  Pneumococcal 13-valent conjugate (PCV13) vaccine. One dose is recommended after age 30.  Pneumococcal polysaccharide (PPSV23) vaccine. One dose is recommended after age 58. Talk to your health care provider about which screenings and vaccines you need and how often you need them. This information is not intended to replace advice given to you by your health care provider. Make sure you discuss any questions you have with your health care provider. Document Released: 04/25/2015 Document Revised: 12/17/2015 Document Reviewed: 01/28/2015 Elsevier Interactive Patient Education  2017 [place] Prevention in the Home Falls can cause injuries. They can happen to people of all ages. There are many things you can do to make your home safe and to help prevent falls. What can I do on the outside of my home?  Regularly fix the edges of walkways and driveways and fix any cracks.  Remove anything that might make you trip as you walk through a door, such as a raised step or threshold.  Trim any bushes or trees on the path to your home.  Use bright outdoor lighting.  Clear any walking paths of anything that might make someone trip, such as rocks or tools.  Regularly check to see if handrails are loose or broken. Make sure that both sides of any steps have handrails.  Any raised decks and porches should have guardrails on the edges.  Have any leaves, snow, or ice cleared regularly.  Use sand or salt on walking paths during winter.  Clean up any spills in your garage right away. This includes oil or grease spills. What can I do in the bathroom?  Use night lights.  Install grab bars by the toilet and in the tub and shower. Do not use towel bars as grab bars.  Use non-skid mats or decals in the tub or shower.  If you need to sit down in the shower, use a plastic, non-slip stool.  Keep the floor dry. Clean up any water that spills on the floor as soon  as it happens.  Remove soap buildup in the tub or shower regularly.  Attach bath mats securely with double-sided non-slip rug tape.  Do not have throw rugs and other things on the floor that can make you trip. What can I do in the bedroom?  Use night lights.  Make sure that you have a light by your bed that is easy to reach.  Do not use any sheets or blankets that are too big for your bed. They should not hang down onto the floor.  Have a firm chair that has side arms. You can use this for support while you get dressed.  Do not have throw rugs and other things on the floor that can make you trip. What can I do in the kitchen?  Clean up any spills right away.  Avoid walking on wet floors.  Keep items that you use a lot in easy-to-reach places.  If you need to reach something above you, use a strong step stool that has a grab bar.  Keep electrical cords out of the way.  Do not use floor polish or wax that makes floors slippery. If you must use wax, use non-skid floor  wax.  Do not have throw rugs and other things on the floor that can make you trip. What can I do with my stairs?  Do not leave any items on the stairs.  Make sure that there are handrails on both sides of the stairs and use them. Fix handrails that are broken or loose. Make sure that handrails are as long as the stairways.  Check any carpeting to make sure that it is firmly attached to the stairs. Fix any carpet that is loose or worn.  Avoid having throw rugs at the top or bottom of the stairs. If you do have throw rugs, attach them to the floor with carpet tape.  Make sure that you have a light switch at the top of the stairs and the bottom of the stairs. If you do not have them, ask someone to add them for you. What else can I do to help prevent falls?  Wear shoes that:  Do not have high heels.  Have rubber bottoms.  Are comfortable and fit you well.  Are closed at the toe. Do not wear sandals.  If  you use a stepladder:  Make sure that it is fully opened. Do not climb a closed stepladder.  Make sure that both sides of the stepladder are locked into place.  Ask someone to hold it for you, if possible.  Clearly mark and make sure that you can see:  Any grab bars or handrails.  First and last steps.  Where the edge of each step is.  Use tools that help you move around (mobility aids) if they are needed. These include:  Canes.  Walkers.  Scooters.  Crutches.  Turn on the lights when you go into a dark area. Replace any light bulbs as soon as they burn out.  Set up your furniture so you have a clear path. Avoid moving your furniture around.  If any of your floors are uneven, fix them.  If there are any pets around you, be aware of where they are.  Review your medicines with your doctor. Some medicines can make you feel dizzy. This can increase your chance of falling. Ask your doctor what other things that you can do to help prevent falls. This information is not intended to replace advice given to you by your health care provider. Make sure you discuss any questions you have with your health care provider. Document Released: 01/23/2009 Document Revised: 09/04/2015 Document Reviewed: 05/03/2014 Elsevier Interactive Patient Education  2017 [employer].

## 2019-01-03 NOTE — Progress Notes (Signed)
Subjective:   TARUS BRISKI is a 73 y.o. male who presents for Medicare Annual/Subsequent preventive examination.  Review of Systems:    This visit is being conducted through telemedicine due to the COVID-19 pandemic. This patient has given me verbal consent via doximity to conduct this visit, patient states they are participating from their home address. Some vital signs may be absent or patient reported.    Patient identification: identified by name, DOB, and current address  Cardiac Risk Factors include: advanced age (>53men, >71 women);diabetes mellitus;hypertension;sedentary lifestyle;male gender     Objective:    Vitals: There were no vitals taken for this visit.  There is no height or weight on file to calculate BMI.  Advanced Directives 01/03/2019 08/01/2018 12/26/2017 09/27/2017 12/09/2016 09/20/2016 09/17/2016  Does Patient Have a Medical Advance Directive? No No No No No No No  Would patient like information on creating a medical advance directive? No - Patient declined - No - Patient declined No - Patient declined - - -    Tobacco Social History   Tobacco Use  Smoking Status Former Smoker  . Packs/day: 3.00  . Years: 35.00  . Pack years: 105.00  . Types: Cigarettes  . Quit date: 04/12/2000  . Years since quitting: 18.7  Smokeless Tobacco Never Used     Counseling given: Not Answered   Clinical Intake:  Pre-visit preparation completed: Yes  Pain : No/denies pain     Nutritional Risks: None Diabetes: Yes CBG done?: No Did pt. bring in CBG monitor from home?: No  How often do you need to have someone help you when you read instructions, pamphlets, or other written materials from your doctor or pharmacy?: 1 - Never What is the last grade level you completed in school?: 12th  Interpreter Needed?: No  Information entered by :: CJohnson, LPN  Past Medical History:  Diagnosis Date  . Allergic rhinitis due to pollen   . Chronic kidney disease, stage III  (moderate) ([place]) 12/19/2012  . Depression   . Diabetes mellitus ([place])   . Diverticulosis   . Erectile dysfunction associated with type 2 diabetes mellitus ([place]) 05/28/2015  . Former very heavy cigarette smoker (more than 40 per day) 10/07/2014  . GERD (gastroesophageal reflux disease)   . Gout   . Hyperlipidemia   . Hypertension   . Hypogonadism male 06/14/2012  . Iron deficiency   . Memory loss   . Osteoarthritis   . Personal history of colonic polyps    1996   Past Surgical History:  Procedure Laterality Date  . CARDIAC CATHETERIZATION  11/2011   [facility]  . CHOLECYSTECTOMY  1980  . ESOPHAGOGASTRODUODENOSCOPY (EGD) WITH PROPOFOL N/A 09/27/2017   Procedure: ESOPHAGOGASTRODUODENOSCOPY (EGD) WITH PROPOFOL;  Surgeon: Lin Landsman, MD;  Location: [facility];  Service: Gastroenterology;  Laterality: N/A;  . EYE SURGERY Right 07/29/2016   cataract - Dr. Manuella Ghazi  . EYE SURGERY Left 08/23/2106   cataract - Dr. Manuella Ghazi  . TOTAL HIP ARTHROPLASTY  2009   Dr. Gladstone Lighter  . TOTAL HIP ARTHROPLASTY     Family History  Problem Relation Age of Onset  . Diabetes Mother   . Alzheimer's disease Mother   . Diabetes Father   . Lung cancer Father        Age 51  . Stroke Father   . Heart attack Father   . Lung cancer Sister        lung  . Heart disease Maternal Grandfather   . COPD  Sister   . Heart attack Sister   . Heart disease Sister    Social History   Socioeconomic History  . Marital status: Widowed    Spouse name: Not on file  . Number of children: 4  . Years of education: 12th  . Highest education level: Not on file  Occupational History  . Occupation: Counsellor  . 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 Financial resource strain: Not hard at all  . Food insecurity    Worry: Never true    Inability: Never true  . Transportation needs    Medical: No    Non-medical: No  Tobacco Use  . Smoking status: Former Smoker    Packs/day: 3.00     Years: 35.00    Pack years: 105.00    Types: Cigarettes    Quit date: 04/12/2000    Years since quitting: 18.7  . Smokeless tobacco: Never Used  Substance and Sexual Activity  . Alcohol use: No  . Drug use: No  . Sexual activity: Never  Lifestyle  . Physical activity    Days per week: 0 days    Minutes per session: 0 min  . Stress: Not at all  Relationships  . Social Herbalist on phone: Not on file    Gets together: Not on file    Attends religious service: Not on file    Active member of club or organization: Not on file    Attends meetings of clubs or organizations: Not on file    Relationship status: Not on file  Other Topics Concern  . Not on file  Social History Narrative   No regular exercise   Caffeine use: yes, dt coke   Lives at home with his mother lives with him.   Wife Fraser Din recently deceased   Right-handed.    Outpatient Encounter Medications as of 01/03/2019  Medication Sig  . acetaminophen (QC ACETAMINOPHEN 8HR ARTH PAIN) 650 MG CR tablet Take 650 mg by mouth every 8 (eight) hours as needed for pain.  . Ascorbic Acid (VITAMIN C) 1000 MG tablet Take 1,000 mg by mouth daily.  [name] Kitchen aspirin EC 81 MG tablet Take 81 mg by mouth at bedtime.  [name] Kitchen atenolol (TENORMIN) 50 MG tablet TAKE 1 TABLET BY MOUTH EVERY DAY  . cholecalciferol (VITAMIN D) 1000 UNITS tablet Take 1,000 Units by mouth daily.    . colchicine 0.6 MG tablet Take 1 tablet (0.6 mg total) by mouth 2 (two) times daily.  [name] Kitchen donepezil (ARICEPT) 10 MG tablet Take 1 tablet (10 mg total) by mouth at bedtime.  [name] Kitchen doxazosin (CARDURA) 8 MG tablet Take 0.5 tablets (4 mg total) by mouth at bedtime.  [name] Kitchen erythromycin ophthalmic ointment Place a 1/2 inch ribbon of ointment into the lower eyelid.  . finasteride (PROSCAR) 5 MG tablet Take 1 tablet (5 mg total) by mouth daily.  . fluticasone (FLONASE) 50 MCG/ACT nasal spray INHALE 2 SPRAYS INTO EACH NOSTRIL EVERY DAY AS NEEDED FOR CONGESTION  . gabapentin (NEURONTIN)  300 MG capsule Take 2 capsules (600 mg total) by mouth at bedtime.  [name] Kitchen ibuprofen (ADVIL,MOTRIN) 200 MG tablet Take 200-400 mg by mouth every 6 (six) hours as needed for headache (pain).  . indomethacin (INDOCIN) 50 MG capsule Take 1 capsule (50 mg total) by mouth 2 (two) times daily with a meal.  . insulin NPH Human (HUMULIN N,NOVOLIN N) 100 UNIT/ML injection Inject 0.25 mLs (25  Units total) into the skin 2 (two) times daily before a meal.  . insulin regular (NOVOLIN R,HUMULIN R) 100 units/mL injection Inject 0.2 mLs (20 Units total) into the skin 2 (two) times daily before a meal.  . lisinopril (PRINIVIL,ZESTRIL) 10 MG tablet Take 1 tablet (10 mg total) by mouth daily.  . memantine (NAMENDA) 10 MG tablet Take 1 tablet (10 mg total) by mouth 2 (two) times daily.  [name] Kitchen omeprazole (PRILOSEC) 40 MG capsule Take 1 capsule (40 mg total) by mouth 2 (two) times daily before a meal.  . ondansetron (ZOFRAN) 4 MG tablet Take 1 tablet (4 mg total) by mouth every 8 (eight) hours as needed for nausea or vomiting.  . prednisoLONE acetate (PRED FORTE) 1 % ophthalmic suspension INSTILL 1 DROP INTO LEFT EYE 4 TIMES A DAY  . Semaglutide,0.25 or 0.5MG /DOS, (OZEMPIC, 0.25 OR 0.5 MG/DOSE,) 2 MG/1.5ML SOPN Inject 0.5 mg into the skin once a week.  . sildenafil (REVATIO) 20 MG tablet Generic Revatio / Sildanefil 20 mg. 2 - 5 tabs 30 mins prior to intercourse.  . simvastatin (ZOCOR) 40 MG tablet Take 1 tablet (40 mg total) by mouth at bedtime.  . valACYclovir (VALTREX) 1000 MG tablet Take 1,000 mg by mouth 3 (three) times daily.  [name] Kitchen venlafaxine XR (EFFEXOR-XR) 75 MG 24 hr capsule TAKE ONE CAPSULE (75 MG) BY MOUTH EVERY MORNING AND TWO CAPSULES (150 MG) AT NIGHT   Facility-Administered Encounter Medications as of 01/03/2019  Medication  . testosterone cypionate (DEPOTESTOSTERONE CYPIONATE) injection 150 mg    Activities of Daily Living In your present state of health, do you have any difficulty performing the following  activities: 01/03/2019  Hearing? N  Vision? N  Difficulty concentrating or making decisions? N  Walking or climbing stairs? Y  Comment uses cane to walk  Dressing or bathing? N  Doing errands, shopping? N  Preparing Food and eating ? N  Using the Toilet? N  In the past six months, have you accidently leaked urine? N  Do you have problems with loss of bowel control? N  Managing your Medications? N  Managing your Finances? N  Housekeeping or managing your Housekeeping? N  Some recent data might be hidden    Patient Care Team: [name], MD as PCP - General Carlean Purl Ofilia Neas, MD as Consulting Physician (Gastroenterology) Philemon Kingdom, MD as Consulting Physician (Internal Medicine)   Assessment:   This is a routine wellness examination for Omarian.  Exercise Activities and Dietary recommendations Current Exercise Habits: The patient does not participate in regular exercise at present, Exercise limited by: None identified  Goals    . Eat more fruits and vegetables     Starting 12/26/2017, I will continue to eat at least 3 servings of fresh fruits and vegetables.     . Patient Stated     Starting 01/03/2019, Patient wants to maintain medications as prescribed.        Fall Risk Fall Risk  01/03/2019 12/26/2017 12/09/2016 10/29/2016 08/26/2015  Falls in the past year? 1 Yes Yes Yes Yes  Comment - multiple falls (7) due to balance issues; denies injury - Emmi Telephone Survey: data to providers prior to load pt had a mini-stroke prior to fall  Number falls in past yr: 1 2 or more 2 or more 2 or more 1  Comment - - - Emmi Telephone Survey Actual Response = 7 -  Injury with Fall? 0 No Yes Yes No  Risk Factor Category  - High  Fall Risk - - -  Risk for fall due to : History of fall(s);Impaired mobility;Impaired balance/gait;Medication side effect Impaired balance/gait;Impaired mobility;History of fall(s) Medication side effect - -  Follow up Falls evaluation completed;Falls  prevention discussed - - - Falls evaluation completed   Is the patient's home free of loose throw rugs in walkways, pet beds, electrical cords, etc?   yes      Grab bars in the bathroom? yes      Handrails on the stairs?   yes      Adequate lighting?   yes  Timed Get Up and Go Performed: n/a  Depression Screen PHQ 2/9 Scores 01/03/2019 12/26/2017 08/03/2017 12/09/2016  PHQ - 2 Score 0 5 5 2   PHQ- 9 Score 0 12 13 6     Cognitive Function MMSE - Mini Mental State Exam 01/03/2019 05/08/2018 12/26/2017 12/09/2016 10/28/2016  Orientation to time 5 5 5 5 5   Orientation to Place 5 5 5 5 5   Registration 3 3 3 3 3   Attention/ Calculation 4 3 0 0 5  Recall 3 3 3 3 2   Language- name 2 objects - 2 0 0 2  Language- repeat 1 1 1 1 1   Language- follow 3 step command - 3 3 3 3   Language- read & follow direction - 1 0 0 1  Write a sentence - 1 0 0 1  Copy design - 1 0 0 1  Total score - 28 20 20 29   Mini Cog  Mini-Cog screen was completed. Maximum score is 22. A value of 0 denotes this part of the MMSE was not completed or the patient failed this part of the Mini-Cog screening.      Immunization History  Administered Date(s) Administered  . Fluad Quad(high Dose 65+) 12/20/2018  . Influenza Split 12/29/2010  . Influenza Whole 01/27/2009, 02/05/2010  . Influenza, High Dose Seasonal PF 02/03/2017, 12/21/2018  . Influenza, Seasonal, Injecte, Preservative Fre 06/14/2012  . Influenza,inj,Quad PF,6+ Mos 12/18/2012, 12/19/2013, 03/19/2015, 01/05/2016, 01/16/2018  . Influenza,inj,quad, With Preservative 01/10/2017  . Pneumococcal Conjugate-13 12/19/2013  . Pneumococcal Polysaccharide-23 10/25/2008, 08/26/2015  . Td 04/13/2007    Qualifies for Shingles Vaccine? yes  Screening Tests Health Maintenance  Topic Date Due  . FOOT EXAM  12/11/2017  . HEMOGLOBIN A1C  10/12/2018  . TETANUS/TDAP  04/12/2019 (Originally 04/12/2017)  . OPHTHALMOLOGY EXAM  08/03/2019  . COLONOSCOPY  09/15/2020  . INFLUENZA  VACCINE  Completed  . Hepatitis C Screening  Completed  . PNA vac Low Risk Adult  Completed   Cancer Screenings: Lung: Low Dose CT Chest recommended if Age 28-80 years, 30 pack-year currently smoking OR have quit w/in 15years. Patient does not qualify. Colorectal: completed 09/16/2010  Additional Screenings:  Hepatitis C Screening: 08/26/2015      Plan:    Patient wants to maintain taking medications as prescribed.   I have personally reviewed and noted the following in the patient's chart:   . Medical and social history . Use of alcohol, tobacco or illicit drugs  . Current medications and supplements . Functional ability and status . Nutritional status . Physical activity . Advanced directives . List of other physicians . Hospitalizations, surgeries, and ER visits in previous 12 months . Vitals . Screenings to include cognitive, depression, and falls . Referrals and appointments  In addition, I have reviewed and discussed with patient certain preventive protocols, quality metrics, and best practice recommendations. A written personalized care plan for preventive services as well as general  preventive health recommendations were provided to patient.     Arles, Rumbold, LPN  7/36/6815

## 2019-01-04 LAB — PSA, TOTAL WITH REFLEX TO PSA, FREE: PSA, Total: 0.8 ng/mL (ref <=4.0)

## 2019-01-09 DIAGNOSIS — M5136 Other intervertebral disc degeneration, lumbar region: Secondary | ICD-10-CM | POA: Diagnosis not present

## 2019-01-09 DIAGNOSIS — S0502XA Injury of conjunctiva and corneal abrasion without foreign body, left eye, initial encounter: Secondary | ICD-10-CM | POA: Diagnosis not present

## 2019-01-09 DIAGNOSIS — Z961 Presence of intraocular lens: Secondary | ICD-10-CM | POA: Diagnosis not present

## 2019-01-09 DIAGNOSIS — H26493 Other secondary cataract, bilateral: Secondary | ICD-10-CM | POA: Diagnosis not present

## 2019-01-10 ENCOUNTER — Encounter: Payer: Self-pay | Admitting: Family Medicine

## 2019-01-10 ENCOUNTER — Other Ambulatory Visit: Payer: Self-pay

## 2019-01-10 ENCOUNTER — Ambulatory Visit (INDEPENDENT_AMBULATORY_CARE_PROVIDER_SITE_OTHER): Payer: Medicare Other | Admitting: Family Medicine

## 2019-01-10 VITALS — BP 180/90 | HR 81 | Temp 97.9°F | Ht 71.5 in | Wt 211.5 lb

## 2019-01-10 DIAGNOSIS — Z0000 Encounter for general adult medical examination without abnormal findings: Secondary | ICD-10-CM | POA: Diagnosis not present

## 2019-01-10 NOTE — Progress Notes (Signed)
[name] T. Dustin Lerette, MD Primary Care and [place] at [facility] [place], [address] Phone: [phone]  FAX: [phone]  [name] - 73 y.o. male  [name] [id number]  Date of Birth: [birth year]  Visit Date: 01/10/2019  PCP: [name], MD  Referred by: [name], MD  Chief Complaint  Patient presents with  . Annual Exam    Part 2   Patient Care Team: [name], MD as PCP - General Carlean Purl Ofilia Neas, MD as Consulting Physician (Gastroenterology) Philemon Kingdom, MD as Consulting Physician (Internal Medicine) Subjective:   [name] is a 73 y.o. pleasant patient who presents with the following:  Preventative Health Maintenance Visit:  Health Maintenance Summary Reviewed and updated, unless pt declines services.  Tobacco History Reviewed. Alcohol: No concerns, no excessive use Exercise Habits: Some activity, rec at least 30 mins 5 times a week STD concerns: no risk or activity to increase risk Drug Use: None Encouraged self-testicular check  Diabetes Mellitus: Tolerating Medications: yes Compliance with diet: fair, Body mass index is 29.09 kg/m. Exercise: minimal / intermittent Avg blood sugars at home: not checking Foot problems: numbness and tingling Hypoglycemia: none No nausea, vomitting, blurred vision, polyuria.  Lab Results  Component Value Date   HGBA1C 11.6 (H) 01/03/2019   HGBA1C 12.5 (H) 04/13/2018   HGBA1C 11.9 (H) 12/29/2017   Lab Results  Component Value Date   MICROALBUR 57.6 (H) 01/03/2019   LDLCALC 43 04/24/2009   CREATININE 1.30 01/03/2019   F/u with Dr. Renne Crigler.   Wt Readings from Last 3 Encounters:  01/10/19 211 lb 8 oz (95.9 kg)  08/21/18 197 lb 12 oz (89.7 kg)  08/04/18 205 lb (93 kg)    Mood is better  Health Maintenance  Topic Date Due  . FOOT EXAM  12/11/2017  . TETANUS/TDAP  04/12/2019 (Originally 04/12/2017)  . HEMOGLOBIN A1C   07/03/2019  . OPHTHALMOLOGY EXAM  08/03/2019  . COLONOSCOPY  09/15/2020  . INFLUENZA VACCINE  Completed  . Hepatitis C Screening  Completed  . PNA vac Low Risk Adult  Completed   Immunization History  Administered Date(s) Administered  . Fluad Quad(high Dose 65+) 12/20/2018  . Influenza Split 12/29/2010  . Influenza Whole 01/27/2009, 02/05/2010  . Influenza, High Dose Seasonal PF 02/03/2017, 12/21/2018  . Influenza, Seasonal, Injecte, Preservative Fre 06/14/2012  . Influenza,inj,Quad PF,6+ Mos 12/18/2012, 12/19/2013, 03/19/2015, 01/05/2016, 01/16/2018  . Influenza,inj,quad, With Preservative 01/10/2017  . Pneumococcal Conjugate-13 12/19/2013  . Pneumococcal Polysaccharide-23 10/25/2008, 08/26/2015  . Td 04/13/2007   Patient Active Problem List   Diagnosis Date Noted  . Uncontrolled type 2 diabetes mellitus with peripheral neuropathy ([place]) 05/11/2016    Priority: Medium  . Former very heavy cigarette smoker (more than 40 per day) 10/07/2014    Priority: Medium  . Chronic kidney disease, stage III (moderate) ([place]) 12/19/2012    Priority: Medium  . OSA (obstructive sleep apnea) 03/09/2011    Priority: Medium  . Diabetic neuropathy ([place]) 12/29/2010    Priority: Medium  . Hypertriglyceridemia 06/13/2008    Priority: Medium  . Major depressive disorder, recurrent episode, in partial remission ([place]) 06/13/2008    Priority: Medium  . Essential hypertension 06/13/2008    Priority: Medium  . Abdominal pain, vomiting, and diarrhea 08/01/2018  . Dehydration 08/01/2018  . Mild cognitive impairment 05/08/2018  . Alteration consciousness 05/08/2018  . Poor balance 02/23/2018  . Falls 10/14/2017  . Esophageal dysphagia   .  Erectile dysfunction associated with type 2 diabetes mellitus ([place]) 05/28/2015  . Hypogonadism male 06/14/2012  . Chest pain 11/11/2011  . Obesity (BMI 30-39.9) 09/27/2011  . Family history of MI (myocardial infarction) 09/16/2010  . HIP REPLACEMENT, RIGHT, HX OF  02/05/2010  . GOUT 06/13/2008  . ALLERGIC RHINITIS 06/13/2008  . GERD 06/13/2008  . OSTEOARTHRITIS 06/13/2008   Past Medical History:  Diagnosis Date  . Allergic rhinitis due to pollen   . Chronic kidney disease, stage III (moderate) ([place]) 12/19/2012  . Depression   . Diabetes mellitus ([place])   . Diverticulosis   . Erectile dysfunction associated with type 2 diabetes mellitus ([place]) 05/28/2015  . Former very heavy cigarette smoker (more than 40 per day) 10/07/2014  . GERD (gastroesophageal reflux disease)   . Gout   . Hyperlipidemia   . Hypertension   . Hypogonadism male 06/14/2012  . Iron deficiency   . Memory loss   . Osteoarthritis   . Personal history of colonic polyps    1996   Past Surgical History:  Procedure Laterality Date  . CARDIAC CATHETERIZATION  11/2011   [facility]  . CHOLECYSTECTOMY  1980  . ESOPHAGOGASTRODUODENOSCOPY (EGD) WITH PROPOFOL N/A 09/27/2017   Procedure: ESOPHAGOGASTRODUODENOSCOPY (EGD) WITH PROPOFOL;  Surgeon: Lin Landsman, MD;  Location: [facility];  Service: Gastroenterology;  Laterality: N/A;  . EYE SURGERY Right 07/29/2016   [facility] - Dr. Manuella Ghazi  . EYE SURGERY Left 08/23/2106   [facility] - Dr. Manuella Ghazi  . TOTAL HIP ARTHROPLASTY  2009   Dr. Gladstone Lighter  . TOTAL HIP ARTHROPLASTY     Social History   Socioeconomic History  . Marital status: Widowed    Spouse name: Not on file  . Number of children: 4  . Years of education: 12th  . Highest education level: Not on file  Occupational History  . Occupation: Counsellor  . 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 Financial resource strain: Not hard at all  . Food insecurity    Worry: Never true    Inability: Never true  . Transportation needs    Medical: No    Non-medical: No  Tobacco Use  . Smoking status: Former Smoker    Packs/day: 3.00    Years: 35.00    Pack years: 105.00    Types: Cigarettes    Quit date: 04/12/2000    Years since quitting: 18.7  .  Smokeless tobacco: Never Used  Substance and Sexual Activity  . Alcohol use: No  . Drug use: No  . Sexual activity: Never  Lifestyle  . Physical activity    Days per week: 0 days    Minutes per session: 0 min  . Stress: Not at all  Relationships  . Social Herbalist on phone: Not on file    Gets together: Not on file    Attends religious service: Not on file    Active member of club or organization: Not on file    Attends meetings of clubs or organizations: Not on file    Relationship status: Not on file  . Intimate partner violence    Fear of current or ex partner: No    Emotionally abused: No    Physically abused: No    Forced sexual activity: No  Other Topics Concern  . Not on file  Social History Narrative   No regular exercise   Caffeine use: yes, dt coke   Lives at home  with his mother lives with him.   Wife Fraser Din recently deceased   Right-handed.   Family History  Problem Relation Age of Onset  . Diabetes Mother   . Alzheimer's disease Mother   . Diabetes Father   . Lung cancer Father        Age 47  . Stroke Father   . Heart attack Father   . Lung cancer Sister        lung  . Heart disease Maternal Grandfather   . COPD Sister   . Heart attack Sister   . Heart disease Sister    Allergies  Allergen Reactions  . Tetracycline Itching and Rash    Medication list has been reviewed and updated.   General: Denies fever, chills, sweats. No significant weight loss. Eyes: Denies blurring,significant itching ENT: Denies earache, sore throat, and hoarseness. Cardiovascular: Denies chest pains, palpitations, dyspnea on exertion Respiratory: Denies cough, dyspnea at rest,wheeezing Breast: no concerns about lumps GI: Denies nausea, vomiting, diarrhea, constipation, change in bowel habits, abdominal pain, melena, hematochezia GU: Denies penile discharge, ED, urinary flow / outflow problems. No STD concerns. Musculoskeletal: Denies back pain, joint pain  Derm: Denies rash, itching Neuro: Denies  paresthesias, frequent falls, frequent headaches Psych: Denies depression, anxiety Endocrine: Denies cold intolerance, heat intolerance, polydipsia Heme: Denies enlarged lymph nodes Allergy: No hayfever  Objective:   BP (!) 180/90   Pulse 81   Temp 97.9 F (36.6 C) (Oral)   Ht 5' 11.5" (1.816 m)   Wt 211 lb 8 oz (95.9 kg)   SpO2 97%   BMI 29.09 kg/m  Ideal Body Weight: Weight in (lb) to have BMI = 25: 181.4  Ideal Body Weight: Weight in (lb) to have BMI = 25: 181.4 No exam data present Depression screen [facility] 2/9 01/03/2019 12/26/2017 08/03/2017 12/09/2016 08/26/2015  Decreased Interest 0 3 3 1 1   Down, Depressed, Hopeless 0 2 2 1 1   PHQ - 2 Score 0 5 5 2 2   Altered sleeping 0 3 3 1  -  Tired, decreased energy 0 3 3 1  -  Change in appetite 0 1 1 0 -  Feeling bad or failure about yourself  0 0 0 0 -  Trouble concentrating 0 0 1 0 -  Moving slowly or fidgety/restless 0 0 0 2 -  Suicidal thoughts 0 0 0 0 -  PHQ-9 Score 0 12 13 6  -  Difficult doing work/chores Not difficult at all Somewhat difficult Not difficult at all Somewhat difficult -  Some recent data might be hidden     GEN: well developed, well nourished, no acute distress Eyes: conjunctiva and lids normal, PERRLA, EOMI ENT: TM clear, nares clear, oral exam WNL Neck: supple, no lymphadenopathy, no thyromegaly, no JVD Pulm: clear to auscultation and percussion, respiratory effort normal CV: regular rate and rhythm, S1-S2, no murmur, rub or gallop, no bruits, peripheral pulses normal and symmetric, no cyanosis, clubbing, edema or varicosities GI: soft, non-tender; no hepatosplenomegaly, masses; active bowel sounds all quadrants GU: no hernia, testicular mass, penile discharge Lymph: no cervical, axillary or inguinal adenopathy MSK: gait normal, muscle tone and strength WNL, no joint swelling, effusions, discoloration, crepitus  SKIN: clear, good turgor, color WNL, no rashes,  lesions, or ulcerations Neuro: normal mental status, normal strength, sensation, and motion Psych: alert; oriented to person, place and time, normally interactive and not anxious or depressed in appearance.  All labs reviewed with patient. Results for orders placed or performed in visit on 01/03/19  Testosterone  Result Value Ref Range   Testosterone 371.99 300.00 - 890.00 ng/dL  Vitamin B12  Result Value Ref Range   Vitamin B-12 223 211 - 911 pg/mL  PSA, Total with Reflex to PSA, Free  Result Value Ref Range   PSA, Total 0.8 < OR = 4.0 ng/mL  Basic metabolic panel  Result Value Ref Range   Sodium 130 (L) 135 - 145 mEq/L   Potassium 4.7 3.5 - 5.1 mEq/L   Chloride 89 (L) 96 - 112 mEq/L   CO2 30 19 - 32 mEq/L   Glucose, Bld 456 (H) 70 - 99 mg/dL   BUN 14 6 - 23 mg/dL   Creatinine, Ser 1.30 0.40 - 1.50 mg/dL   Calcium 9.4 8.4 - 10.5 mg/dL   GFR 54.09 (L) >60.00 mL/min  Hepatic function panel  Result Value Ref Range   Total Bilirubin 0.6 0.2 - 1.2 mg/dL   Bilirubin, Direct 0.1 0.0 - 0.3 mg/dL   Alkaline Phosphatase 93 39 - 117 U/L   AST 8 0 - 37 U/L   ALT 8 0 - 53 U/L   Total Protein 6.8 6.0 - 8.3 g/dL   Albumin 4.1 3.5 - 5.2 g/dL  CBC with Differential/Platelet  Result Value Ref Range   WBC 7.7 4.0 - 10.5 K/uL   RBC 4.93 4.22 - 5.81 Mil/uL   Hemoglobin 14.9 13.0 - 17.0 g/dL   HCT 43.5 39.0 - 52.0 %   MCV 88.2 78.0 - 100.0 fl   MCHC 34.3 30.0 - 36.0 g/dL   RDW 12.2 11.5 - 15.5 %   Platelets 204.0 150.0 - 400.0 K/uL   Neutrophils Relative % 70.6 43.0 - 77.0 %   Lymphocytes Relative 18.2 12.0 - 46.0 %   Monocytes Relative 9.6 3.0 - 12.0 %   Eosinophils Relative 1.3 0.0 - 5.0 %   Basophils Relative 0.3 0.0 - 3.0 %   Neutro Abs 5.4 1.4 - 7.7 K/uL   Lymphs Abs 1.4 0.7 - 4.0 K/uL   Monocytes Absolute 0.7 0.1 - 1.0 K/uL   Eosinophils Absolute 0.1 0.0 - 0.7 K/uL   Basophils Absolute 0.0 0.0 - 0.1 K/uL  Hemoglobin A1c  Result Value Ref Range   Hgb A1c MFr Bld 11.6 (H) 4.6  - 6.5 %  Lipid panel  Result Value Ref Range   Cholesterol 172 0 - 200 mg/dL   Triglycerides (H) 0.0 - 149.0 mg/dL    521.0 Triglyceride is over 400; calculations on Lipids are invalid.   HDL 31.40 (L) >39.00 mg/dL   Total CHOL/HDL Ratio 5   Microalbumin / creatinine urine ratio  Result Value Ref Range   Microalb, Ur 57.6 (H) 0.0 - 1.9 mg/dL   Creatinine,U 50.7 mg/dL   Microalb Creat Ratio 113.7 (H) 0.0 - 30.0 mg/g  LDL cholesterol, direct  Result Value Ref Range   Direct LDL 82.0 mg/dL    Assessment and Plan:     ICD-10-CM   1. Healthcare maintenance  Z00.00    Tavaras is doing well with the exception of his diabetes, which is doing very poorly.  He does have follow-up scheduled with Dr. Renne Crigler.  I am not sure I have anything else to add in this case.  Blood pressure is elevated today, but he missed some of his medication and he is generally fairly noncompliant in general.  Health Maintenance Exam: The patient's preventative maintenance and recommended screening tests for an annual wellness exam were reviewed in full today. Brought up  to date unless services declined.  Counselled on the importance of diet, exercise, and its role in overall health and mortality. The patient's FH and SH was reviewed, including their home life, tobacco status, and drug and alcohol status.  Follow-up in 1 year for physical exam or additional follow-up below.  Follow-up: No follow-ups on file. Or follow-up in 1 year if not noted.  No orders of the defined types were placed in this encounter.  Medications Discontinued During This Encounter  Medication Reason  . prednisoLONE acetate (PRED FORTE) 1 % ophthalmic suspension Completed Course  . erythromycin ophthalmic ointment Completed Course  . Semaglutide,0.25 or 0.5MG /DOS, (OZEMPIC, 0.25 OR 0.5 MG/DOSE,) 2 MG/1.5ML SOPN Cost of medication   No orders of the defined types were placed in this encounter.   Signed,  Maud Deed. Tian Mcmurtrey, MD    Allergies as of 01/10/2019      Reactions   Tetracycline Itching, Rash      Medication List       Accurate as of January 10, 2019 11:37 AM. If you have any questions, ask your nurse or doctor.        STOP taking these medications   erythromycin ophthalmic ointment Stopped by: [name], MD   prednisoLONE acetate 1 % ophthalmic suspension Commonly known as: PRED FORTE Stopped by: [name], MD   Semaglutide(0.25 or 0.5MG /DOS) 2 MG/1.5ML Sopn Commonly known as: Ozempic (0.25 or 0.5 MG/DOSE) Stopped by: [name], MD     TAKE these medications   aspirin EC 81 MG tablet Take 81 mg by mouth at bedtime.   atenolol 50 MG tablet Commonly known as: TENORMIN TAKE 1 TABLET BY MOUTH EVERY DAY   bacitracin-polymyxin b ophthalmic ointment Commonly known as: POLYSPORIN   cholecalciferol 1000 units tablet Commonly known as: VITAMIN D Take 1,000 Units by mouth daily.   colchicine 0.6 MG tablet Take 1 tablet (0.6 mg total) by mouth 2 (two) times daily.   donepezil 10 MG tablet Commonly known as: ARICEPT Take 1 tablet (10 mg total) by mouth at bedtime.   doxazosin 8 MG tablet Commonly known as: CARDURA Take 0.5 tablets (4 mg total) by mouth at bedtime.   finasteride 5 MG tablet Commonly known as: PROSCAR Take 1 tablet (5 mg total) by mouth daily.   fluticasone 50 MCG/ACT nasal spray Commonly known as: FLONASE INHALE 2 SPRAYS INTO EACH NOSTRIL EVERY DAY AS NEEDED FOR CONGESTION   gabapentin 300 MG capsule Commonly known as: NEURONTIN Take 2 capsules (600 mg total) by mouth at bedtime.   ibuprofen 200 MG tablet Commonly known as: ADVIL Take 200-400 mg by mouth every 6 (six) hours as needed for headache (pain).   indomethacin 50 MG capsule Commonly known as: INDOCIN Take 1 capsule (50 mg total) by mouth 2 (two) times daily with a meal.   insulin NPH Human 100 UNIT/ML injection Commonly known as: NOVOLIN N Inject 0.25 mLs (25 Units total) into the  skin 2 (two) times daily before a meal.   insulin regular 100 units/mL injection Commonly known as: NOVOLIN R Inject 0.2 mLs (20 Units total) into the skin 2 (two) times daily before a meal.   lisinopril 10 MG tablet Commonly known as: ZESTRIL Take 1 tablet (10 mg total) by mouth daily.   memantine 10 MG tablet Commonly known as: NAMENDA Take 1 tablet (10 mg total) by mouth 2 (two) times daily.   omeprazole 40 MG capsule Commonly known as: PRILOSEC Take 1 capsule (40 mg total)  by mouth 2 (two) times daily before a meal.   ondansetron 4 MG tablet Commonly known as: Zofran Take 1 tablet (4 mg total) by mouth every 8 (eight) hours as needed for nausea or vomiting.   QC Acetaminophen 8Hr Arth Pain 650 MG CR tablet Generic drug: acetaminophen Take 650 mg by mouth every 8 (eight) hours as needed for pain.   sildenafil 20 MG tablet Commonly known as: Revatio Generic Revatio / Sildanefil 20 mg. 2 - 5 tabs 30 mins prior to intercourse.   simvastatin 40 MG tablet Commonly known as: ZOCOR Take 1 tablet (40 mg total) by mouth at bedtime.   valACYclovir 1000 MG tablet Commonly known as: VALTREX Take 1,000 mg by mouth 3 (three) times daily.   venlafaxine XR 75 MG 24 hr capsule Commonly known as: EFFEXOR-XR TAKE ONE CAPSULE (75 MG) BY MOUTH EVERY MORNING AND TWO CAPSULES (150 MG) AT NIGHT   vitamin C 1000 MG tablet Take 1,000 mg by mouth daily.

## 2019-01-11 ENCOUNTER — Ambulatory Visit: Payer: Medicare Other

## 2019-01-12 DIAGNOSIS — H26493 Other secondary cataract, bilateral: Secondary | ICD-10-CM | POA: Diagnosis not present

## 2019-01-12 DIAGNOSIS — S0502XD Injury of conjunctiva and corneal abrasion without foreign body, left eye, subsequent encounter: Secondary | ICD-10-CM | POA: Diagnosis not present

## 2019-01-12 DIAGNOSIS — H4922 Sixth [abducent] nerve palsy, left eye: Secondary | ICD-10-CM | POA: Diagnosis not present

## 2019-01-25 ENCOUNTER — Ambulatory Visit (INDEPENDENT_AMBULATORY_CARE_PROVIDER_SITE_OTHER): Payer: Medicare Other

## 2019-01-25 DIAGNOSIS — E291 Testicular hypofunction: Secondary | ICD-10-CM | POA: Diagnosis not present

## 2019-01-25 NOTE — Progress Notes (Signed)
Per orders of Dr. Lorelei Pont, injection of 150mg  Testosterone given Left upper outer quad by Hudsyn Champine, Tera Partridge. Patient tolerated injection well.

## 2019-01-26 ENCOUNTER — Other Ambulatory Visit: Payer: Self-pay | Admitting: Family Medicine

## 2019-01-27 ENCOUNTER — Other Ambulatory Visit: Payer: Self-pay | Admitting: Family Medicine

## 2019-02-07 ENCOUNTER — Ambulatory Visit (INDEPENDENT_AMBULATORY_CARE_PROVIDER_SITE_OTHER): Payer: Medicare Other | Admitting: *Deleted

## 2019-02-07 ENCOUNTER — Other Ambulatory Visit: Payer: Self-pay | Admitting: *Deleted

## 2019-02-07 DIAGNOSIS — E291 Testicular hypofunction: Secondary | ICD-10-CM | POA: Diagnosis not present

## 2019-02-07 MED ORDER — TESTOSTERONE CYPIONATE 200 MG/ML IM SOLN: 150.0000 mg | Freq: Once | INTRAMUSCULAR | 5 refills | Status: DC

## 2019-02-07 MED ORDER — TESTOSTERONE CYPIONATE 200 MG/ML IM SOLN
150.0000 mg | INTRAMUSCULAR | Status: DC
  Administered 2019-02-07 – 2019-06-20 (×3): 150 mg via INTRAMUSCULAR

## 2019-02-07 NOTE — Telephone Encounter (Signed)
Last office visit 01/10/2019 for CPE.  Last refilled ?  Last testosterone level 01/03/2019 which was normal at 371.99 ng/dl.  No future appointments with PCP.

## 2019-02-07 NOTE — Progress Notes (Signed)
Per orders of Dr. Lorelei Pont, injection of Testosterone 150mg (.75mL) given by GREEN, ASHTYN M. Patient tolerated injection well.

## 2019-02-09 ENCOUNTER — Encounter: Payer: Medicare Other | Attending: Internal Medicine | Admitting: Internal Medicine

## 2019-02-09 ENCOUNTER — Other Ambulatory Visit: Payer: Self-pay

## 2019-02-09 DIAGNOSIS — Z8249 Family history of ischemic heart disease and other diseases of the circulatory system: Secondary | ICD-10-CM | POA: Insufficient documentation

## 2019-02-09 DIAGNOSIS — E1151 Type 2 diabetes mellitus with diabetic peripheral angiopathy without gangrene: Secondary | ICD-10-CM | POA: Diagnosis not present

## 2019-02-09 DIAGNOSIS — M109 Gout, unspecified: Secondary | ICD-10-CM | POA: Insufficient documentation

## 2019-02-09 DIAGNOSIS — E11621 Type 2 diabetes mellitus with foot ulcer: Secondary | ICD-10-CM | POA: Diagnosis not present

## 2019-02-09 DIAGNOSIS — I252 Old myocardial infarction: Secondary | ICD-10-CM | POA: Insufficient documentation

## 2019-02-09 DIAGNOSIS — Z87891 Personal history of nicotine dependence: Secondary | ICD-10-CM | POA: Diagnosis not present

## 2019-02-09 DIAGNOSIS — M21372 Foot drop, left foot: Secondary | ICD-10-CM | POA: Diagnosis not present

## 2019-02-09 DIAGNOSIS — M1990 Unspecified osteoarthritis, unspecified site: Secondary | ICD-10-CM | POA: Insufficient documentation

## 2019-02-09 DIAGNOSIS — I10 Essential (primary) hypertension: Secondary | ICD-10-CM | POA: Diagnosis not present

## 2019-02-09 DIAGNOSIS — E1142 Type 2 diabetes mellitus with diabetic polyneuropathy: Secondary | ICD-10-CM | POA: Insufficient documentation

## 2019-02-09 DIAGNOSIS — L97522 Non-pressure chronic ulcer of other part of left foot with fat layer exposed: Secondary | ICD-10-CM | POA: Insufficient documentation

## 2019-02-09 NOTE — Progress Notes (Signed)
DAX, MURGUIA ([id number]) Visit Report for 02/09/2019 Chief Complaint Document Details Patient Name: [name], [name] Date of Service: 02/09/2019 8:00 AM Medical Record Number: [id number] Patient Account Number: [id number] Date of Birth/Sex: 1946-01-14 (73 y.o. M) Treating RN: Harold Barban Primary Care Provider: [name] Other Clinician: Referring Provider: Referral, Self Treating Provider/Extender: Tito Dine in Treatment: 0 Information Obtained from: Patient Chief Complaint Left dorsal foot ulcer 02/09/2019; patient is here for review of the wound on the left lateral foot roughly at the level of the base of the fifth metatarsal Electronic Signature(s) Signed: 02/09/2019 5:42:32 PM By: Linton Ham MD Entered By: Linton Ham on 02/09/2019 08:57:01 [name] ([id number]) -------------------------------------------------------------------------------- Debridement Details Patient Name: [name] Date of Service: 02/09/2019 8:00 AM Medical Record Number: [id number] Patient Account Number: [id number] Date of Birth/Sex: June 19, 1945 (73 y.o. M) Treating RN: Montey Hora Primary Care Provider: [name] Other Clinician: Referring Provider: Referral, Self Treating Provider/Extender: Tito Dine in Treatment: 0 Debridement Performed for Wound #5 Left,Lateral Foot Assessment: Performed By: Physician Ricard Dillon, MD Debridement Type: Debridement Severity of Tissue Pre Fat layer exposed Debridement: Level of Consciousness (Pre- Awake and Alert procedure): Pre-procedure Verification/Time Yes - 08:43 Out Taken: Start Time: 08:43 Pain Control: Lidocaine 4% Topical Solution Total Area Debrided (L x W): 0.7 (cm) x 0.5 (cm) = 0.35 (cm) Tissue and other material Viable, Non-Viable, Callus, Subcutaneous, Skin: Dermis debrided: Level: Skin/Subcutaneous Tissue Debridement Description: Excisional Instrument:  Blade, Curette, Forceps Bleeding: Minimum Hemostasis Achieved: Silver Nitrate End Time: 08:48 Procedural Pain: 0 Post Procedural Pain: 0 Response to Treatment: Procedure was tolerated well Level of Consciousness Awake and Alert (Post-procedure): Post Debridement Measurements of Total Wound Length: (cm) 1.4 Width: (cm) 1.5 Depth: (cm) 0.1 Volume: (cm) 0.165 Character of Wound/Ulcer Post Debridement: Improved Severity of Tissue Post Debridement: Fat layer exposed Post Procedure Diagnosis Same as Pre-procedure Electronic Signature(s) Signed: 02/09/2019 5:07:31 PM By: Montey Hora Signed: 02/09/2019 5:42:32 PM By: Linton Ham MD Entered By: Montey Hora on 02/09/2019 08:54:36 [name] ([id number]) -------------------------------------------------------------------------------- HPI Details Patient Name: [name] Date of Service: 02/09/2019 8:00 AM Medical Record Number: [id number] Patient Account Number: [id number] Date of Birth/Sex: 12-21-1945 (73 y.o. M) Treating RN: Harold Barban Primary Care Provider: [name] Other Clinician: Referring Provider: Referral, Self Treating Provider/Extender: Tito Dine in Treatment: 0 History of Present Illness HPI Description: 10/04/16 on evaluation today patient presents for initial evaluation concerning a laceration of the right wrist and hand on the bowler aspect. He tells me that he fell into glass following a medication change in regard to his diabetes. He initially went to [facility] ER where she was sutured and subsequently his daughter who is a Marine scientist removed the teachers at the appropriate time. Unfortunately the wound has done poorly and dehissed which has caused him some trouble including infection. He has been on antibiotics both topically and orally though the wound continues to show signs of necrotic tissue according to notes which is what he was sent to [place] for evaluation concerning. He  does have diabetes and unfortunately this is severely uncontrolled. He does see an endocrinologist and is working on this. 10/11/2016 -- the patient's notes from the ER were reviewed and he had his suturing done on 09/17/2016 and had necrotic skin flaps which had to be trimmed the last visit he was here. He has managed to get his Santyl ointment and has been using this appropriately. 10/21/16 patient's wound appears to  be doing very well on evaluation today. He still has some Slough covering the wound bed but this is nowhere near as severe as when i saw this initially two weeks ago nor even my review of his pictures last week. I'm pleased with how this is progressing. Patient's wound over the right form appears to be doing fairly well on evaluation today. There is no evidence of infection and he has a small area at the crease of where she is wrist extends and flexes that is still open and appears to be having difficulty due to the fact that it is cracking open. I think this is something that may be controlled with moisture control that is keeping the wound a little bit more moist. Nonetheless this is a tiny region compared to the initial wound and overall appears to be doing very well. No fevers, chills, nausea, or vomiting noted at this time. 11/25/16 on evaluation today patient appears to be doing well in regard to his right wrist ulcer. In fact this appears to be completely healed he is having no discomfort. He is pleased with how this is progressed. Readmission: 06/24/17 on evaluation today patient appears back in our clinic regarding issues that he has been having with his right ankle on the lateral malleolus as well is in the dorsal surface of his left foot. Both have been going on for several weeks unfortunately. He tells me that initially this was being managed by Dr. Edilia Bo and I did review the notes from Dr. Edilia Bo in epic. Patient was placed on amoxicillin initially and then  subsequently Keflex following. He has been using triple antibiotic ointment over-the-counter along with a Band-Aid for the wound currently. He did not have any Santyl remaining. His most recent hemoglobin A1c which was just two days ago was 10.3. Fortunately he seems to have excellent blood flow in the bilateral lower extremities with his left ABI being 0.95 in the right ABI 1.01. Patient does have some pain in regard to each of these ulcerations but he tells me it's not nearly as severe as the hand wound that I help treat earlier over the summer. This was in 2018. 07/15/17 on evaluation today patient's lower surety ulcer is actually both appear to be doing fairly well at this point. He does have a little bit of skin overlapping the ulcer on the right lateral ankle this was debrided away today just with saline and gauze without complication patient had no discomfort or pain. Otherwise patient's left foot ulcer does appear to be showing signs of improvement he does seem to have a right great toe. Nikki and noted at this point in time. I think that this does need to be managed at this point. No fevers, chills, nausea, or vomiting noted at this time. 07/22/17 on evaluation today patient's wounds in regard to his lower extremities appear to be doing somewhat better. His right ankle wound in fact appears to likely be completely closed. The left dorsal foot ulcer though still open does appear to be a little bit smaller and does also seem to be making good progress. He has not been having problems with the treatment at this point in the general he does not seem to show as much erythema surrounding the wound bed which is good news. He is not DEMARUS, LATTERELL E. ([id number]) having significant discomfort. 08/12/17 on evaluation today patients wounds of both appeared to be doing better in fact they both appear to be completely healed. Overall I'm very  pleased with how things have progressed over the past week even  more than what I expected. Obviously this is good news and he is happy about this. He did have a fall when going to his orthopedic doctor recently fortunately he does not appear to have injured anything severely but nonetheless does have a couple of bumps and bruises nothing significant at this time but he needs to be seen and wound care for. He may have broken his left wrist he is following up with orthopedics in that regard. Readmission: 10/24/17 on evaluation today patient presents for reevaluation after having been discharged Aug 12, 2017. His wound was healed at that point although he states that it has been somewhat discolored since that time. He has not experienced the drainage as far as the wound is concerned but the fact that it was still discolored been greater than two months after the fact made him want to come in for reevaluation. Upon inspection today the patient has no pain although he does have neuropathy. He does have a discolored region of the dorsal surface of the left foot which was the site where the ulcer was that we previously treated. The good news is he does not again have any discomfort although obviously I do believe this may be some scar tissue and just discoloration in that regard. Again he hasn't had any drainage or discharge since I last saw him and at this point I see no evidence of that either this area shows complete epithelialization. In general I do not feel like there's any significant issue at this point in that regard. READMISSION 02/09/2019 This is a 73 year old man with type 2 diabetes poorly controlled with significant peripheral neuropathy. He has been in this clinic before in 2018 with a laceration on his right hand and then again in 2019 with an area over the right lateral malleolus. 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 He states that the wound is been there for the  last week or 2. It is been uncomfortable to walk on. The man is an active man having to look after an elderly mother. Past medical history, type 2 diabetes with polyneuropathy with a recent hemoglobin A1c in September 2011 0.3, lumbar disc disease, hypergonadism., Left foot drop and gout. We did not repeat his ABIs in the clinic quoting values from July 2019 of 0.95 and 1.01. He is not felt to have significant PAD Electronic Signature(s) Signed: 02/09/2019 5:42:32 PM By: Linton Ham MD Entered By: Linton Ham on 02/09/2019 08:58:59 [name] ([id number]) -------------------------------------------------------------------------------- Physical Exam Details Patient Name: [name] Date of Service: 02/09/2019 8:00 AM Medical Record Number: [id number] Patient Account Number: [id number] Date of Birth/Sex: [birth year] (73 y.o. M) Treating RN: Harold Barban Primary Care Provider: [name] Other Clinician: Referring Provider: Referral, Self Treating Provider/Extender: Tito Dine in Treatment: 0 Constitutional Patient is hypertensive.. Pulse regular and within target range for patient.[name] Kitchen Respirations regular, non-labored and within target range.. Temperature is normal and within the target range for the patient.[name] Kitchen appears in no distress. Eyes Conjunctivae clear. No discharge. Cardiovascular Needle pulses on the left side are palpable at the dorsalis pedis and posterior tibial. No major edema on the left. Some noticeable venous disease and varicosities on the left calf. Musculoskeletal Inversion of the left ankle. Integumentary (Hair, Skin) There is no erythema around the wound. Neurological neuropathy but did  well in the microfilament. Psychiatric No evidence of depression, anxiety, or agitation. Calm, cooperative, and communicative. Appropriate interactions and affect.. Notes Wound exam; the areas on the left lateral foot at the level of the base  of the fifth metatarsal. I think this is probably a weightbearing surface the way this man is walking. Presented with a small wound with significant undermining of callus thick skin. Debrided with pickups and a #15 blade to get a healthy looking wound bed. Hemostasis with silver nitrate. No obvious infection Electronic Signature(s) Signed: 02/09/2019 5:42:32 PM By: Linton Ham MD Entered By: Linton Ham on 02/09/2019 09:02:37 [name] ([id number]) -------------------------------------------------------------------------------- Physician Orders Details Patient Name: [name] Date of Service: 02/09/2019 8:00 AM Medical Record Number: [id number] Patient Account Number: [id number] Date of Birth/Sex: 1945-06-14 (73 y.o. M) Treating RN: Montey Hora Primary Care Provider: [name] Other Clinician: Referring Provider: Referral, Self Treating Provider/Extender: Tito Dine in Treatment: 0 Verbal / Phone Orders: No Diagnosis Coding Wound Cleansing Wound #5 Left,Lateral Foot o Clean wound with Normal Saline. o Cleanse wound with mild soap and water o May Shower, gently pat wound dry prior to applying new dressing. Primary Wound Dressing Wound #5 Left,Lateral Foot o Silver Alginate Secondary Dressing Wound #5 Left,Lateral Foot o Boardered Foam Dressing Dressing Change Frequency Wound #5 Left,Lateral Foot o Change dressing every other day. Follow-up Appointments Wound #5 Left,Lateral Foot o Return Appointment in 1 week. - Tuesday 02/13/19 Off-Loading Wound #5 Left,Lateral Foot o Open toe surgical shoe with peg assist. - left foot Radiology o X-ray, foot Electronic Signature(s) Signed: 02/09/2019 5:07:31 PM By: Montey Hora Signed: 02/09/2019 5:42:32 PM By: Linton Ham MD Entered By: Montey Hora on 02/09/2019 08:53:25 [name]  ([id number]) -------------------------------------------------------------------------------- Problem List Details Patient Name: [name] Date of Service: 02/09/2019 8:00 AM Medical Record Number: [id number] Patient Account Number: [id number] Date of Birth/Sex: December 07, 1945 (73 y.o. M) Treating RN: Harold Barban Primary Care Provider: [name] Other Clinician: Referring Provider: Referral, Self Treating Provider/Extender: Tito Dine in Treatment: 0 Active Problems ICD-10 Evaluated Encounter Code Description Active Date Today Diagnosis E11.621 Type 2 diabetes mellitus with foot ulcer 02/09/2019 No Yes E11.42 Type 2 diabetes mellitus with diabetic polyneuropathy 02/09/2019 No Yes L97.522 Non-pressure chronic ulcer of other part of left foot with fat 02/09/2019 No Yes layer exposed M21.372 Foot drop, left foot 02/09/2019 No Yes Inactive Problems Resolved Problems Electronic Signature(s) Signed: 02/09/2019 5:42:32 PM By: Linton Ham MD Entered By: Linton Ham on 02/09/2019 08:56:12 [name] ([id number]) -------------------------------------------------------------------------------- Progress Note Details Patient Name: [name] Date of Service: 02/09/2019 8:00 AM Medical Record Number: [id number] Patient Account Number: [id number] Date of Birth/Sex: 1945/10/05 (73 y.o. M) Treating RN: Harold Barban Primary Care Provider: [name] Other Clinician: Referring Provider: Referral, Self Treating Provider/Extender: Tito Dine in Treatment: 0 Subjective Chief Complaint Information obtained from Patient Left dorsal foot ulcer 02/09/2019; patient is here for review of the wound on the left lateral foot roughly at the level of the base of the fifth metatarsal History of Present Illness (HPI) 10/04/16 on evaluation today patient presents for initial evaluation concerning a laceration of the right wrist and hand on  the bowler aspect. He tells me that he fell into glass following a medication change in regard to his diabetes. He initially went to [facility] [facility] ER where she was sutured and subsequently his daughter who is a Marine scientist removed the teachers at the appropriate time. Unfortunately the wound  has done poorly and dehissed which has caused him some trouble including infection. He has been on antibiotics both topically and orally though the wound continues to show signs of necrotic tissue according to notes which is what he was sent to [place] for evaluation concerning. He does have diabetes and unfortunately this is severely uncontrolled. He does see an endocrinologist and is working on this. 10/11/2016 -- the patient's notes from the ER were reviewed and he had his suturing done on 09/17/2016 and had necrotic skin flaps which had to be trimmed the last visit he was here. He has managed to get his Santyl ointment and has been using this appropriately. 10/21/16 patient's wound appears to be doing very well on evaluation today. He still has some Slough covering the wound bed but this is nowhere near as severe as when i saw this initially two weeks ago nor even my review of his pictures last week. I'm pleased with how this is progressing. Patient's wound over the right form appears to be doing fairly well on evaluation today. There is no evidence of infection and he has a small area at the crease of where she is wrist extends and flexes that is still open and appears to be having difficulty due to the fact that it is cracking open. I think this is something that may be controlled with moisture control that is keeping the wound a little bit more moist. Nonetheless this is a tiny region compared to the initial wound and overall appears to be doing very well. No fevers, chills, nausea, or vomiting noted at this time. 11/25/16 on evaluation today patient appears to be doing well in regard to his right wrist ulcer. In fact  this appears to be completely healed he is having no discomfort. He is pleased with how this is progressed. Readmission: 06/24/17 on evaluation today patient appears back in our clinic regarding issues that he has been having with his right ankle on the lateral malleolus as well is in the dorsal surface of his left foot. Both have been going on for several weeks unfortunately. He tells me that initially this was being managed by Dr. Edilia Bo and I did review the notes from Dr. Edilia Bo in epic. Patient was placed on amoxicillin initially and then subsequently Keflex following. He has been using triple antibiotic ointment over-the-counter along with a Band-Aid for the wound currently. He did not have any Santyl remaining. His most recent hemoglobin A1c which was just two days ago was 10.3. Fortunately he seems to have excellent blood flow in the bilateral lower extremities with his left ABI being 0.95 in the right ABI 1.01. Patient does have some pain in regard to each of these ulcerations but he tells me it's not nearly as severe as the hand wound that I help treat earlier over the summer. This was in 2018. 07/15/17 on evaluation today patient's lower surety ulcer is actually both appear to be doing fairly well at this point. He does have a little bit of skin overlapping the ulcer on the right lateral ankle this was debrided away today just with saline and gauze without complication patient had no discomfort or pain. Otherwise patient's left foot ulcer does appear to be showing signs of TATEN, MERROW. ([id number]) improvement he does seem to have a right great toe. Nikki and noted at this point in time. I think that this does need to be managed at this point. No fevers, chills, nausea, or vomiting noted at this  time. 07/22/17 on evaluation today patient's wounds in regard to his lower extremities appear to be doing somewhat better. His right ankle wound in fact appears to likely be completely  closed. The left dorsal foot ulcer though still open does appear to be a little bit smaller and does also seem to be making good progress. He has not been having problems with the treatment at this point in the general he does not seem to show as much erythema surrounding the wound bed which is good news. He is not having significant discomfort. 08/12/17 on evaluation today patients wounds of both appeared to be doing better in fact they both appear to be completely healed. Overall I'm very pleased with how things have progressed over the past week even more than what I expected. Obviously this is good news and he is happy about this. He did have a fall when going to his orthopedic doctor recently fortunately he does not appear to have injured anything severely but nonetheless does have a couple of bumps and bruises nothing significant at this time but he needs to be seen and wound care for. He may have broken his left wrist he is following up with orthopedics in that regard. Readmission: 10/24/17 on evaluation today patient presents for reevaluation after having been discharged Aug 12, 2017. His wound was healed at that point although he states that it has been somewhat discolored since that time. He has not experienced the drainage as far as the wound is concerned but the fact that it was still discolored been greater than two months after the fact made him want to come in for reevaluation. Upon inspection today the patient has no pain although he does have neuropathy. He does have a discolored region of the dorsal surface of the left foot which was the site where the ulcer was that we previously treated. The good news is he does not again have any discomfort although obviously I do believe this may be some scar tissue and just discoloration in that regard. Again he hasn't had any drainage or discharge since I last saw him and at this point I see no evidence of that either this area shows complete  epithelialization. In general I do not feel like there's any significant issue at this point in that regard. READMISSION 02/09/2019 This is a 73 year old man with type 2 diabetes poorly controlled with significant peripheral neuropathy. He has been in this clinic before in 2018 with a laceration on his right hand and then again in 2019 with an area over the right lateral malleolus. 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 He states that the wound is been there for the last week or 2. It is been uncomfortable to walk on. The man is an active man having to look after an elderly mother. Past medical history, type 2 diabetes with polyneuropathy with a recent hemoglobin A1c in September 2011 0.3, lumbar disc disease, hypergonadism., Left foot drop and gout. We did not repeat his ABIs in the clinic quoting values from July 2019 of 0.95 and 1.01. He is not felt to have significant PAD Patient History Information obtained from Patient. Allergies tetracycline Family History Cancer - Siblings, Diabetes - Father, Hypertension - Father, Kidney Disease - Mother, Stroke - Father, No family history of Heart Disease, Lung Disease, Thyroid Problems, Tuberculosis. Social History Former smoker, Marital Status - Widowed, Alcohol Use -  Never, Drug Use - No History, Caffeine Use - Moderate. Medical History Eyes Patient has history of Cataracts - 2018 ORMOND, LAZO ([id number]) Ear/Nose/Mouth/Throat Patient has history of Middle ear problems Denies history of Chronic sinus problems/congestion Hematologic/Lymphatic Denies history of Anemia, Hemophilia, Human Immunodeficiency Virus, Lymphedema, Sickle Cell Disease Respiratory Patient has history of Sleep Apnea Denies history of Aspiration, Asthma, Chronic Obstructive Pulmonary Disease (COPD), Pneumothorax, Tuberculosis Cardiovascular Patient has history of  Hypertension Denies history of Angina, Arrhythmia, Congestive Heart Failure, Coronary Artery Disease, Deep Vein Thrombosis, Hypotension, Myocardial Infarction, Peripheral Arterial Disease, Peripheral Venous Disease, Phlebitis, Vasculitis Gastrointestinal Denies history of Cirrhosis , Colitis, Crohn s, Hepatitis A, Hepatitis B, Hepatitis C Endocrine Patient has history of Type II Diabetes Genitourinary Denies history of End Stage Renal Disease Immunological Denies history of Lupus Erythematosus, Raynaud s, Scleroderma Musculoskeletal Patient has history of Gout - history, Osteoarthritis Denies history of Rheumatoid Arthritis, Osteomyelitis Neurologic Patient has history of Neuropathy Denies history of Dementia, Quadriplegia, Paraplegia, Seizure Disorder Oncologic Denies history of Received Chemotherapy, Received Radiation Medical And Surgical History Notes Cardiovascular CVA in 2015 Genitourinary "weak kidneys" - is a patient at [facility]) Hematologic/Lymphatic Denies complaints or symptoms of Bleeding / Clotting Disorders, Human Immunodeficiency Virus. Objective Constitutional Patient is hypertensive.. Pulse regular and within target range for patient.[name] Kitchen Respirations regular, non-labored and within target range.. Temperature is normal and within the target range for the patient.[name] Kitchen appears in no distress. Vitals Time Taken: 8:14 AM, Height: 71 in, Source: Stated, Weight: 205 lbs, Source: Stated, BMI: 28.6, Temperature: 98.2 F, Pulse: 82 bpm, Respiratory Rate: 18 breaths/min, Blood Pressure: 160/74 mmHg. Eyes Conjunctivae clear. No discharge. JUSHUA, WALTMAN ([id number]) Cardiovascular Needle pulses on the left side are palpable at the dorsalis pedis and posterior tibial. No major edema on the left. Some noticeable venous disease and varicosities on the left calf. Musculoskeletal Inversion of the left ankle. Neurological neuropathy but did well in  the microfilament. Psychiatric No evidence of depression, anxiety, or agitation. Calm, cooperative, and communicative. Appropriate interactions and affect.. General Notes: Wound exam; the areas on the left lateral foot at the level of the base of the fifth metatarsal. I think this is probably a weightbearing surface the way this man is walking. Presented with a small wound with significant undermining of callus thick skin. Debrided with pickups and a #15 blade to get a healthy looking wound bed. Hemostasis with silver nitrate. No obvious infection Integumentary (Hair, Skin) There is no erythema around the wound. Wound #5 status is Open. Original cause of wound was Pressure Injury. The wound is located on the Left,Lateral Foot. The wound measures 0.7cm length x 0.5cm width x 0.3cm depth; 0.275cm^2 area and 0.082cm^3 volume. There is Fat Layer (Subcutaneous Tissue) Exposed exposed. There is no tunneling noted, however, there is undermining starting at 1:00 and ending at 6:00 with a maximum distance of 1.1cm. There is a medium amount of serous drainage noted. The wound margin is thickened. There is small (1-33%) pale granulation within the wound bed. There is a large (67-100%) amount of necrotic tissue within the wound bed including Adherent Slough. 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 There was a Excisional Skin/Subcutaneous Tissue Debridement with a total area of 0.35 sq cm performed by Ricard Dillon, MD. With the following instrument(s): Blade, Curette, and Forceps to remove Viable and Non-Viable  tissue/material. Material removed includes Callus, Subcutaneous Tissue, and Skin: Dermis after achieving pain control using Lidocaine 4% Topical Solution. No specimens were taken. A time out was conducted at 08:43, prior to the start of the procedure. A Minimum amount of bleeding was controlled with Silver Nitrate. The procedure was tolerated well with a pain level of 0 throughout and a pain level of 0 following VERNARD, GRAM E. ([id number]) the procedure. Post Debridement Measurements: 1.4cm length x 1.5cm width x 0.1cm depth; 0.165cm^3 volume. Character of Wound/Ulcer Post Debridement is improved. Severity of Tissue Post Debridement is: Fat layer exposed. Post procedure Diagnosis Wound #5: Same as Pre-Procedure Plan Wound Cleansing: Wound #5 Left,Lateral Foot: Clean wound with Normal Saline. Cleanse wound with mild soap and water May Shower, gently pat wound dry prior to applying new dressing. Primary Wound Dressing: Wound #5 Left,Lateral Foot: Silver Alginate Secondary Dressing: Wound #5 Left,Lateral Foot: Boardered Foam Dressing Dressing Change Frequency: Wound #5 Left,Lateral Foot: Change dressing every other day. Follow-up Appointments: Wound #5 Left,Lateral Foot: Return Appointment in 1 week. - Tuesday 02/13/19 Off-Loading: Wound #5 Left,Lateral Foot: Open toe surgical shoe with peg assist. - left foot Radiology ordered were: X-ray, foot 1. I use silver alginate as the primary dressing 2. I have advised the patient to make a follow-up appointment at [facility] to see if we can change the mechanics of the brace and how this man is likely walking. 3. Gave him a surgical shoe again to see if we can switch the way he is walking from the lateral foot. 4. Cannot rule out trying to put him in a total contact cast if this is problematic. 5. I have done an x-ray of the foot specifically looking at the base of the fifth metatarsal. I did not do a culture. Electronic  Signature(s) Signed: 02/09/2019 5:42:32 PM By: Linton Ham MD Entered By: Linton Ham on 02/09/2019 09:03:45 [name] ([id number]) -------------------------------------------------------------------------------- ROS/PFSH Details Patient Name: [name] Date of Service: 02/09/2019 8:00 AM Medical Record Number: [id number] Patient Account Number: [id number] Date of Birth/Sex: [birth year] (73 y.o. M) Treating RN: Harold Barban Primary Care Provider: [name] Other Clinician: Referring Provider: Referral, Self Treating Provider/Extender: Tito Dine in Treatment: 0 Information Obtained From Patient Hematologic/Lymphatic Complaints and Symptoms: Negative for: Bleeding / Clotting Disorders; Human Immunodeficiency Virus Medical History: Negative for: Anemia; Hemophilia; Human Immunodeficiency Virus; Lymphedema; Sickle Cell Disease Eyes Medical History: Positive for: Cataracts - 2018 Ear/Nose/Mouth/Throat Medical History: Positive for: Middle ear problems Negative for: Chronic sinus problems/congestion Respiratory Medical History: Positive for: Sleep Apnea Negative for: Aspiration; Asthma; Chronic Obstructive Pulmonary Disease (COPD); Pneumothorax; Tuberculosis Cardiovascular Medical History: Positive for: Hypertension Negative for: Angina; Arrhythmia; Congestive Heart Failure; Coronary Artery Disease; Deep Vein Thrombosis; Hypotension; Myocardial Infarction; Peripheral Arterial Disease; Peripheral Venous Disease; Phlebitis; Vasculitis Past Medical History Notes: CVA in 2015 Gastrointestinal Medical History: Negative for: Cirrhosis ; Colitis; Crohnos; Hepatitis A; Hepatitis B; Hepatitis C Endocrine Medical History: Positive for: Type II Diabetes Time with diabetes: 20 yrs Treated with: Insulin Blood sugar tested every day: Yes Tested : TID Blood sugar testing results: EMRIK, ERHARD ([id number]) Bedtime:  130 Genitourinary Medical History: Negative for: End Stage Renal Disease Past Medical History Notes: "weak kidneys" - is a patient at [facility] Medical History:  Negative for: Lupus Erythematosus; Raynaudos; Scleroderma Musculoskeletal Medical History: Positive for: Gout - history; Osteoarthritis Negative for: Rheumatoid Arthritis; Osteomyelitis Neurologic Medical History: Positive for: Neuropathy Negative for: Dementia; Quadriplegia; Paraplegia; Seizure Disorder Oncologic Medical History: Negative for: Received Chemotherapy; Received Radiation HBO Extended History Items Eyes: Ear/Nose/Mouth/Throat: Cataracts Middle ear problems Immunizations Pneumococcal Vaccine: Received Pneumococcal Vaccination: Yes Immunization Notes: up to date Implantable Devices None Family and Social History Cancer: Yes - Siblings; Diabetes: Yes - Father; Heart Disease: No; Hypertension: Yes - Father; Kidney Disease: Yes - Mother; Lung Disease: No; Stroke: Yes - Father; Thyroid Problems: No; Tuberculosis: No; Former smoker; Marital Status - Widowed; Alcohol Use: Never; Drug Use: No History; Caffeine Use: Moderate; Financial Concerns: No; Food, Clothing or Shelter Needs: No; Support System Lacking: No; Transportation Concerns: No Electronic Signature(s) Signed: 02/09/2019 4:47:34 PM By: Harold Barban Signed: 02/09/2019 5:42:32 PM By: Linton Ham MD Entered By: Harold Barban on 02/09/2019 08:21:37 [name] ([id number]) -------------------------------------------------------------------------------- [place] Details Patient Name: [name] Date of Service: 02/09/2019 Medical Record Number: [id number] Patient Account Number: [id number] Date of Birth/Sex: [birth year] (73 y.o. M) Treating RN: Harold Barban Primary Care Provider: [name] Other Clinician: Referring Provider: Referral, Self Treating Provider/Extender: Tito Dine in  Treatment: 0 Diagnosis Coding ICD-10 Codes Code Description E11.621 Type 2 diabetes mellitus with foot ulcer E11.42 Type 2 diabetes mellitus with diabetic polyneuropathy L97.522 Non-pressure chronic ulcer of other part of left foot with fat layer exposed M21.372 Foot drop, left foot Facility Procedures CPT4 Code: 59747185 Description: [place] VISIT-LEV 3 EST PT Modifier: Quantity: 1 CPT4 Code: 50158682 Description: [place] - DEB SUBQ TISSUE 20 SQ CM/< ICD-10 Diagnosis Description L97.522 Non-pressure chronic ulcer of other part of left foot with fat Modifier: layer exposed Quantity: 1 Physician Procedures CPT4 Code: 5749355 Description: 99213 - WC PHYS LEVEL 3 - EST PT ICD-10 Diagnosis Description L97.522 Non-pressure chronic ulcer of other part of left foot with fat E11.621 Type 2 diabetes mellitus with foot ulcer E11.42 Type 2 diabetes mellitus with diabetic  polyneuropathy M21.372 Foot drop, left foot Modifier: 25 layer exposed Quantity: 1 CPT4 Code: 2174715 Description: 11042 - WC PHYS SUBQ TISS 20 SQ CM ICD-10 Diagnosis Description L97.522 Non-pressure chronic ulcer of other part of left foot with fat Modifier: layer exposed Quantity: 1 Electronic Signature(s) Signed: 02/09/2019 5:42:32 PM By: Linton Ham MD Entered By: Linton Ham on 02/09/2019 09:04:20

## 2019-02-09 NOTE — Progress Notes (Signed)
[name], [name] ([id number]) Visit Report for 02/09/2019 Allergy List Details Patient Name: [name] Date of Service: 02/09/2019 8:00 AM Medical Record Number: [id number] Patient Account Number: [id number] Date of Birth/Sex: [birth year] (73 y.o. M) Treating RN: Harold Barban Primary Care Avram Danielson: [name] Other Clinician: Referring Trinka Keshishyan: Referral, Self Treating Jhanae Jaskowiak/Extender: Ricard Dillon Weeks in Treatment: 0 Allergies Active Allergies tetracycline Allergy Notes Electronic Signature(s) Signed: 02/09/2019 4:47:34 PM By: Harold Barban Entered By: Harold Barban on 02/09/2019 08:23:45 [name] ([id number]) -------------------------------------------------------------------------------- Arrival Information Details Patient Name: [name] Date of Service: 02/09/2019 8:00 AM Medical Record Number: [id number] Patient Account Number: [id number] Date of Birth/Sex: Feb 11, 1946 (73 y.o. M) Treating RN: Harold Barban Primary Care Irianna Gilday: [name] Other Clinician: Referring Mylik Pro: Referral, Self Treating Dorthey Depace/Extender: Tito Dine in Treatment: 0 Visit Information Patient Arrived: Cane Arrival Time: 08:11 Accompanied By: self Transfer Assistance: None Patient Identification Verified: Yes Secondary Verification Process Completed: Yes Patient Has Alerts: Yes Patient Alerts: DMII History Since Last Visit Added or deleted any medications: No Any new allergies or adverse reactions: No Had a fall or experienced change in activities of daily living that may affect risk of falls: No Signs or symptoms of abuse/neglect since last visito No Hospitalized since last visit: No Has Dressing in Place as Prescribed: No Electronic Signature(s) Signed: 02/09/2019 5:07:31 PM By: Montey Hora Entered By: Montey Hora on 02/09/2019 08:41:22 [name]  ([id number]) -------------------------------------------------------------------------------- Clinic Level of Care Assessment Details Patient Name: [name] Date of Service: 02/09/2019 8:00 AM Medical Record Number: [id number] Patient Account Number: [id number] Date of Birth/Sex: [birth year] (73 y.o. M) Treating RN: Montey Hora Primary Care Madalena Kesecker: [name] Other Clinician: Referring Shiri Hodapp: Referral, Self Treating Teneka Malmberg/Extender: Tito Dine in Treatment: 0 Clinic Level of Care Assessment Items TOOL 1 Quantity Score []  - Use when EandM and Procedure is performed on INITIAL visit 0 ASSESSMENTS - Nursing Assessment / Reassessment X - General Physical Exam (combine w/ comprehensive assessment (listed just below) when 1 20 performed on new pt. evals) X- 1 25 Comprehensive Assessment (HX, ROS, Risk Assessments, Wounds Hx, etc.) ASSESSMENTS - Wound and Skin Assessment / Reassessment []  - Dermatologic / Skin Assessment (not related to wound area) 0 ASSESSMENTS - Ostomy and/or Continence Assessment and Care []  - Incontinence Assessment and Management 0 []  - 0 Ostomy Care Assessment and Management (repouching, etc.) PROCESS - Coordination of Care X - Simple Patient / Family Education for ongoing care 1 15 []  - 0 Complex (extensive) Patient / Family Education for ongoing care X- 1 10 Staff obtains Programmer, systems, Records, Test Results / Process Orders []  - 0 Staff telephones HHA, Nursing Homes / Clarify orders / etc []  - 0 Routine Transfer to another Facility (non-emergent condition) []  - 0 Routine Hospital Admission (non-emergent condition) X- 1 15 New Admissions / Biomedical engineer / Ordering NPWT, Apligraf, etc. []  - 0 Emergency Hospital Admission (emergent condition) PROCESS - Special Needs []  - Pediatric / Minor Patient Management 0 []  - 0 Isolation Patient Management []  - 0 Hearing / Language / Visual special needs []  - 0 Assessment  of Community assistance (transportation, D/C planning, etc.) []  - 0 Additional assistance / Altered mentation []  - 0 Support Surface(s) Assessment (bed, cushion, seat, etc.) [name], [name] E. ([id number]) INTERVENTIONS - Miscellaneous []  - External ear exam 0 []  - 0 Patient Transfer (multiple staff / Civil Service fast streamer / Similar devices) []  - 0 Simple Staple / Suture removal (25 or less) []  -  0 Complex Staple / Suture removal (26 or more) []  - 0 Hypo/Hyperglycemic Management (do not check if billed separately) X- 1 15 Ankle / Brachial Index (ABI) - do not check if billed separately Has the patient been seen at the hospital within the last three years: Yes Total Score: 100 Level Of Care: New/Established - Level 3 Electronic Signature(s) Signed: 02/09/2019 5:07:31 PM By: Montey Hora Entered By: Montey Hora on 02/09/2019 08:45:44 [name] ([id number]) -------------------------------------------------------------------------------- Encounter Discharge Information Details Patient Name: [name] Date of Service: 02/09/2019 8:00 AM Medical Record Number: [id number] Patient Account Number: [id number] Date of Birth/Sex: 1945/04/15 (73 y.o. M) Treating RN: Montey Hora Primary Care Lathen Seal: [name] Other Clinician: Referring Tria Noguera: Referral, Self Treating Marlo Goodrich/Extender: Tito Dine in Treatment: 0 Encounter Discharge Information Items Post Procedure Vitals Discharge Condition: Stable Temperature (F): 98.2 Ambulatory Status: Ambulatory Pulse (bpm): 82 Discharge Destination: Home Respiratory Rate (breaths/min): 18 Transportation: Private Auto Blood Pressure (mmHg): 160/74 Accompanied By: self Schedule Follow-up Appointment: Yes Clinical Summary of Care: Electronic Signature(s) Signed: 02/09/2019 5:07:31 PM By: Montey Hora Entered By: Montey Hora on 02/09/2019 08:55:58 [name]  ([id number]) -------------------------------------------------------------------------------- Lower Extremity Assessment Details Patient Name: [name] Date of Service: 02/09/2019 8:00 AM Medical Record Number: [id number] Patient Account Number: [id number] Date of Birth/Sex: Jan 06, 1946 (73 y.o. M) Treating RN: Harold Barban Primary Care Rafiq Bucklin: [name] Other Clinician: Referring Tramel Westbrook: Referral, Self Treating Rajendra Spiller/Extender: Tito Dine in Treatment: 0 Edema Assessment Assessed: [Left: No] [Right: No] [Left: Edema] [Right: :] Calf Left: Right: Point of Measurement: 34 cm From Medial Instep 34 cm 34 cm Ankle Left: Right: Point of Measurement: 11 cm From Medial Instep 21.4 cm 21 cm Vascular Assessment Pulses: Dorsalis Pedis Palpable: [Left:Yes] [Right:Yes] Posterior Tibial Palpable: [Left:Yes] [Right:Yes] Electronic Signature(s) Signed: 02/09/2019 4:47:34 PM By: Harold Barban Entered By: Harold Barban on 02/09/2019 08:25:57 [name] ([id number]) -------------------------------------------------------------------------------- Multi Wound Chart Details Patient Name: [name] Date of Service: 02/09/2019 8:00 AM Medical Record Number: [id number] Patient Account Number: [id number] Date of Birth/Sex: 1945-09-01 (73 y.o. M) Treating RN: Montey Hora Primary Care Corrado Hymon: [name] Other Clinician: Referring Tri Chittick: Referral, Self Treating Claudy Abdallah/Extender: Tito Dine in Treatment: 0 Vital Signs Height(in): 71 Pulse(bpm): 82 Weight(lbs): 205 Blood Pressure(mmHg): 160/74 Body Mass Index(BMI): 29 Temperature(F): 98.2 Respiratory Rate 18 (breaths/min): Photos: [N/A:N/A] Wound Location: Left Foot - Lateral N/A N/A Wounding Event: Pressure Injury N/A N/A Primary Etiology: Diabetic Wound/Ulcer of the N/A N/A Lower Extremity Comorbid History: Cataracts, Middle ear N/A N/A problems, Sleep  Apnea, Hypertension, Type II Diabetes, Gout, Osteoarthritis, Neuropathy Date Acquired: 01/19/2019 N/A N/A Weeks of Treatment: 0 N/A N/A Wound Status: Open N/A N/A Measurements L x W x D 0.7x0.5x0.3 N/A N/A (cm) Area (cm) : 0.275 N/A N/A Volume (cm) : 0.082 N/A N/A % Reduction in Area: 0.00% N/A N/A % Reduction in Volume: 0.00% N/A N/A Starting Position 1 1 (o'clock): Ending Position 1 6 (o'clock): Maximum Distance 1 (cm): 1.1 Undermining: Yes N/A N/A Classification: Grade 2 N/A N/A Exudate Amount: Medium N/A N/A Exudate Type: Serous N/A N/A Exudate Color: amber N/A N/A Wound Margin: Thickened N/A N/A FREDIS, MALKIEWICZ ([id number]) Granulation Amount: Small (1-33%) N/A N/A Granulation Quality: Pale N/A N/A Necrotic Amount: Large (67-100%) N/A N/A Exposed Structures: Fat Layer (Subcutaneous N/A N/A Tissue) Exposed: Yes Fascia: No Tendon: No Muscle: No Joint: No Bone: No Epithelialization: None N/A N/A Debridement: Debridement - Excisional N/A N/A Pre-procedure 08:43 N/A N/A Verification/Time  Out Taken: Pain Control: Lidocaine 4% Topical Solution N/A N/A Tissue Debrided: Callus, Subcutaneous N/A N/A Level: Skin/Subcutaneous Tissue N/A N/A Debridement Area (sq cm): 0.35 N/A N/A Instrument: Blade, Curette, Forceps N/A N/A Bleeding: Minimum N/A N/A Hemostasis Achieved: Silver Nitrate N/A N/A Procedural Pain: 0 N/A N/A Post Procedural Pain: 0 N/A N/A Debridement Treatment Procedure was tolerated well N/A N/A Response: Post Debridement 1.4x1.5x0.1 N/A N/A Measurements L x W x D (cm) Post Debridement Volume: 0.165 N/A N/A (cm) Procedures Performed: Debridement N/A N/A Treatment Notes Wound #5 (Left, Lateral Foot) Notes silvercel and BFD with peg assist shoe Electronic Signature(s) Signed: 02/09/2019 5:42:32 PM By: Linton Ham MD Entered By: Linton Ham on 02/09/2019 08:56:39 [name]  ([id number]) -------------------------------------------------------------------------------- Pain Assessment Details Patient Name: [name] Date of Service: 02/09/2019 8:00 AM Medical Record Number: [id number] Patient Account Number: [id number] Date of Birth/Sex: 1945/10/23 (73 y.o. M) Treating RN: Harold Barban Primary Care Linkyn Gobin: [name] Other Clinician: Referring [name]: Referral, Self Treating Miamor Ayler/Extender: Tito Dine in Treatment: 0 Active Problems Location of Pain Severity and Description of Pain Patient Has Paino Yes Site Locations Rate the pain. Current Pain Level: 4 Pain Management and Medication Current Pain Management: Electronic Signature(s) Signed: 02/09/2019 4:47:34 PM By: Harold Barban Entered By: Harold Barban on 02/09/2019 08:12:52 [name] ([id number]) -------------------------------------------------------------------------------- Patient/Caregiver Education Details Patient Name: [name] Date of Service: 02/09/2019 8:00 AM Medical Record Number: [id number] Patient Account Number: [id number] Date of Birth/Gender: [birth year] (73 y.o. M) Treating RN: Montey Hora Primary Care Physician: [name] Other Clinician: Referring Physician: Referral, Self Treating Physician/Extender: Tito Dine in Treatment: 0 Education Assessment Education Provided To: Patient Education Topics Provided Wound/Skin Impairment: Handouts: Other: wound care as ordered Methods: Demonstration, Explain/Verbal Responses: State content correctly Electronic Signature(s) Signed: 02/09/2019 5:07:31 PM By: Montey Hora Entered By: Montey Hora on 02/09/2019 08:46:06 [name] ([id number]) -------------------------------------------------------------------------------- Wound Assessment Details Patient Name: [name] Date of Service: 02/09/2019 8:00 AM Medical Record Number:  [id number] Patient Account Number: [id number] Date of Birth/Sex: Jun 28, 1945 (73 y.o. M) Treating RN: Harold Barban Primary Care Atianna Haidar: [name] Other Clinician: Referring [facility]: Referral, Self Treating Jonanthony Nahar/Extender: Tito Dine in Treatment: 0 Wound Status Wound Number: 5 Primary Diabetic Wound/Ulcer of the Lower Extremity Etiology: Wound Location: Left Foot - Lateral Wound Open Wounding Event: Pressure Injury Status: Date Acquired: 01/19/2019 Comorbid Cataracts, Middle ear problems, Sleep Apnea, Weeks Of Treatment: 0 History: Hypertension, Type II Diabetes, Gout, Clustered Wound: No Osteoarthritis, Neuropathy Photos Wound Measurements Length: (cm) 0.7 % Reduction Width: (cm) 0.5 % Reduction Depth: (cm) 0.3 Epitheliali Area: (cm) 0.275 Tunneling: Volume: (cm) 0.082 Underminin Starting Ending P Maximum in Area: 0% in Volume: 0% zation: None No g: Yes Position (o'clock): 1 osition (o'clock): 6 Distance: (cm) 1.1 Wound Description Classification: Grade 2 Foul Odor A Wound Margin: Thickened Exudate Amount: Medium Exudate Type: Serous Exudate Color: amber fter Cleansing: No Wound Bed Granulation Amount: Small (1-33%) Exposed Structure Granulation Quality: Pale Fascia Exposed: No Necrotic Amount: Large (67-100%) Fat Layer (Subcutaneous Tissue) Exposed: Yes Necrotic Quality: Adherent Slough Tendon Exposed: No Muscle Exposed: No Joint Exposed: No NATHAN, MOCTEZUMA E. ([id number]) Bone Exposed: No Treatment Notes Wound #5 (Left, Lateral Foot) Notes silvercel and BFD with peg assist shoe Electronic Signature(s) Signed: 02/09/2019 4:47:34 PM By: Harold Barban Entered By: Harold Barban on 02/09/2019 08:23:34 [name] ([id number]) -------------------------------------------------------------------------------- Vitals Details Patient Name: [name] Date of Service: 02/09/2019 8:00 AM Medical Record  Number:  [id number] Patient Account Number: [id number] Date of Birth/Sex: 1946-02-18 (73 y.o. M) Treating RN: Harold Barban Primary Care Tajee Savant: [name] Other Clinician: Referring Shawni Volkov: Referral, Self Treating Marytza Grandpre/Extender: Tito Dine in Treatment: 0 Vital Signs Time Taken: 08:14 Temperature (F): 98.2 Height (in): 71 Pulse (bpm): 82 Source: Stated Respiratory Rate (breaths/min): 18 Weight (lbs): 205 Blood Pressure (mmHg): 160/74 Source: Stated Reference Range: 80 - 120 mg / dl Body Mass Index (BMI): 28.6 Electronic Signature(s) Signed: 02/09/2019 4:47:34 PM By: Harold Barban Entered By: Harold Barban on 02/09/2019 08:15:21

## 2019-02-09 NOTE — Progress Notes (Signed)
JALIL, LORUSSO ([id number]) Visit Report for 02/09/2019 Abuse/Suicide Risk Screen Details Patient Name: [name], [name] Date of Service: 02/09/2019 8:00 AM Medical Record Number: [id number] Patient Account Number: [id number] Date of Birth/Sex: Jul 04, 1945 (73 y.o. M) Treating RN: Harold Barban Primary Care Evalisse Prajapati: [name] Other Clinician: Referring Esaw Knippel: Referral, Self Treating Karan Inclan/Extender: Tito Dine in Treatment: 0 Abuse/Suicide Risk Screen Items Answer ABUSE RISK SCREEN: Has anyone close to you tried to hurt or harm you recentlyo No Do you feel uncomfortable with anyone in your familyo No Has anyone forced you do things that you didnot want to doo No Electronic Signature(s) Signed: 02/09/2019 4:47:34 PM By: Harold Barban Entered By: Harold Barban on 02/09/2019 08:26:11 [name] ([id number]) -------------------------------------------------------------------------------- Activities of Daily Living Details Patient Name: [name] Date of Service: 02/09/2019 8:00 AM Medical Record Number: [id number] Patient Account Number: [id number] Date of Birth/Sex: 1946/01/14 (73 y.o. M) Treating RN: Harold Barban Primary Care Sumaiya Arruda: [name] Other Clinician: Referring Avah Bashor: Referral, Self Treating Cece Milhouse/Extender: Tito Dine in Treatment: 0 Activities of Daily Living Items Answer Activities of Daily Living (Please select one for each item) Drive Automobile Completely Able Take Medications Completely Able Use Telephone Completely Able Care for Appearance Completely Able Use Toilet Completely Able Bath / Shower Completely Able Dress Self Completely Able Feed Self Completely Able Walk Completely Able Get In / Out Bed Completely Able Housework Completely Able Prepare Meals Completely Able Handle Money Completely Able Shop for Self Completely Able Electronic Signature(s) Signed: 02/09/2019 4:47:34  PM By: Harold Barban Entered By: Harold Barban on 02/09/2019 08:26:24 [name] ([id number]) -------------------------------------------------------------------------------- Education Screening Details Patient Name: [name] Date of Service: 02/09/2019 8:00 AM Medical Record Number: [id number] Patient Account Number: [id number] Date of Birth/Sex: 1945/10/30 (73 y.o. M) Treating RN: Harold Barban Primary Care Garielle Mroz: [name] Other Clinician: Referring Demonte Dobratz: Referral, Self Treating Rosana Farnell/Extender: Tito Dine in Treatment: 0 Primary Learner Assessed: Patient Learning Preferences/Education Level/Primary Language Learning Preference: Explanation Highest Education Level: High School Preferred Language: English Cognitive Barrier Language Barrier: No Translator Needed: No Memory Deficit: No Emotional Barrier: No Cultural/Religious Beliefs Affecting Medical Care: No Physical Barrier Impaired Vision: No Impaired Hearing: No Decreased Hand dexterity: No Knowledge/Comprehension Knowledge Level: High Comprehension Level: High Ability to understand written High instructions: Ability to understand verbal High instructions: Motivation Anxiety Level: Calm Cooperation: Cooperative Education Importance: Acknowledges Need Interest in Health Problems: Asks Questions Perception: Coherent Willingness to Engage in Self- High Management Activities: Readiness to Engage in Self- High Management Activities: Electronic Signature(s) Signed: 02/09/2019 4:47:34 PM By: Harold Barban Entered By: Harold Barban on 02/09/2019 08:26:46 [name] ([id number]) -------------------------------------------------------------------------------- Fall Risk Assessment Details Patient Name: [name] Date of Service: 02/09/2019 8:00 AM Medical Record Number: [id number] Patient Account Number: [id number] Date of Birth/Sex: 1945/12/22 (73  y.o. M) Treating RN: Harold Barban Primary Care Braydee Shimkus: [name] Other Clinician: Referring Stephens Shreve: Referral, Self Treating Orie Baxendale/Extender: Tito Dine in Treatment: 0 Fall Risk Assessment Items Have you had 2 or more falls in the last 12 monthso 0 Yes Have you had any fall that resulted in injury in the last 12 monthso 0 No FALLS RISK SCREEN History of falling - immediate or within 3 months 0 No Secondary diagnosis (Do you have 2 or more medical diagnoseso) 0 No Ambulatory aid None/bed rest/wheelchair/nurse 0 No Crutches/cane/walker 15 Yes Furniture 0 No Intravenous therapy Access/Saline/Heparin Lock 0 No Gait/Transferring Normal/ bed rest/ wheelchair 0 No Weak (short  steps with or without shuffle, stooped but able to lift head while 0 No walking, may seek support from furniture) Impaired (short steps with shuffle, may have difficulty arising from chair, head 0 No down, impaired balance) Mental Status Oriented to own ability 0 Yes Electronic Signature(s) Signed: 02/09/2019 4:47:34 PM By: Harold Barban Entered By: Harold Barban on 02/09/2019 08:27:24 [name] ([id number]) -------------------------------------------------------------------------------- Foot Assessment Details Patient Name: [name] Date of Service: 02/09/2019 8:00 AM Medical Record Number: [id number] Patient Account Number: [id number] Date of Birth/Sex: [birth year] (73 y.o. M) Treating RN: Harold Barban Primary Care Jennah Satchell: [name] Other Clinician: Referring Merriam Brandner: Referral, Self Treating Bintou Lafata/Extender: Tito Dine in Treatment: 0 Foot Assessment Items Site Locations + = Sensation present, - = Sensation absent, C = Callus, U = Ulcer R = Redness, W = Warmth, M = Maceration, PU = Pre-ulcerative lesion F = Fissure, S = Swelling, D = Dryness Assessment Right: Left: Other Deformity: No No Prior Foot Ulcer: No No Prior  Amputation: No No Charcot Joint: No No Ambulatory Status: Ambulatory With Help Assistance Device: Cane Gait: Unsteady Notes Drop foot-L Electronic Signature(s) Signed: 02/09/2019 4:47:34 PM By: Harold Barban Entered By: Harold Barban on 02/09/2019 08:28:20 [name] ([id number]) -------------------------------------------------------------------------------- Nutrition Risk Screening Details Patient Name: [name] Date of Service: 02/09/2019 8:00 AM Medical Record Number: [id number] Patient Account Number: [id number] Date of Birth/Sex: 11-13-1945 (73 y.o. M) Treating RN: Harold Barban Primary Care Rose-Marie Hickling: [name] Other Clinician: Referring Avien Taha: Referral, Self Treating Kit Brubacher/Extender: Tito Dine in Treatment: 0 Height (in): 71 Weight (lbs): 205 Body Mass Index (BMI): 28.6 Nutrition Risk Screening Items Score Screening NUTRITION RISK SCREEN: I have an illness or condition that made me change the kind and/or amount of 0 No food I eat I eat fewer than two meals per day 0 No I eat few fruits and vegetables, or milk products 0 No I have three or more drinks of beer, liquor or wine almost every day 0 No I have tooth or mouth problems that make it hard for me to eat 0 No I don't always have enough money to buy the food I need 0 No I eat alone most of the time 0 No I take three or more different prescribed or over-the-counter drugs a day 1 Yes Without wanting to, I have lost or gained 10 pounds in the last six months 0 No I am not always physically able to shop, cook and/or feed myself 0 No Nutrition Protocols Good Risk Protocol Moderate Risk Protocol High Risk Proctocol Risk Level: Good Risk Score: 1 Electronic Signature(s) Signed: 02/09/2019 4:47:34 PM By: Harold Barban Entered By: Harold Barban on 02/09/2019 08:27:34

## 2019-02-12 ENCOUNTER — Other Ambulatory Visit: Payer: Self-pay | Admitting: Internal Medicine

## 2019-02-12 ENCOUNTER — Ambulatory Visit
Admission: RE | Admit: 2019-02-12 | Discharge: 2019-02-12 | Disposition: A | Discharge status: Home / Self Care | Payer: Medicare Other | Source: Ambulatory Visit | Attending: Internal Medicine | Admitting: Internal Medicine

## 2019-02-12 DIAGNOSIS — S81802A Unspecified open wound, left lower leg, initial encounter: Secondary | ICD-10-CM

## 2019-02-12 DIAGNOSIS — L97529 Non-pressure chronic ulcer of other part of left foot with unspecified severity: Secondary | ICD-10-CM | POA: Diagnosis not present

## 2019-02-19 ENCOUNTER — Encounter: Payer: Medicare Other | Attending: Physician Assistant | Admitting: Physician Assistant

## 2019-02-19 ENCOUNTER — Other Ambulatory Visit: Payer: Self-pay

## 2019-02-19 DIAGNOSIS — L97522 Non-pressure chronic ulcer of other part of left foot with fat layer exposed: Secondary | ICD-10-CM | POA: Insufficient documentation

## 2019-02-19 DIAGNOSIS — M21372 Foot drop, left foot: Secondary | ICD-10-CM | POA: Insufficient documentation

## 2019-02-19 DIAGNOSIS — E1142 Type 2 diabetes mellitus with diabetic polyneuropathy: Secondary | ICD-10-CM | POA: Insufficient documentation

## 2019-02-19 DIAGNOSIS — E11621 Type 2 diabetes mellitus with foot ulcer: Secondary | ICD-10-CM | POA: Diagnosis not present

## 2019-02-19 DIAGNOSIS — Z87891 Personal history of nicotine dependence: Secondary | ICD-10-CM | POA: Insufficient documentation

## 2019-02-19 DIAGNOSIS — I10 Essential (primary) hypertension: Secondary | ICD-10-CM | POA: Diagnosis not present

## 2019-02-19 NOTE — Progress Notes (Addendum)
[name], BRAMEL ([id number]) Visit Report for 02/19/2019 Chief Complaint Document Details Patient Name: [name], [name] Date of Service: 02/19/2019 9:30 AM Medical Record Number: [id number] Patient Account Number: [id number] Date of Birth/Sex: 1946-01-27 (73 y.o. M) Treating RN: Harold Barban Primary Care Provider: [name] Other Clinician: Referring Provider: [name] Treating Provider/Extender: Melburn Hake, HOYT Weeks in Treatment: 1 Information Obtained from: Patient Chief Complaint Left dorsal foot ulcer 02/09/2019; patient is here for review of the wound on the left lateral foot roughly at the level of the base of the fifth metatarsal Electronic Signature(s) Signed: 02/19/2019 9:59:18 AM By: Worthy Keeler [name] Entered By: Worthy Keeler on 02/19/2019 09:59:17 [name] ([id number]) -------------------------------------------------------------------------------- Debridement Details Patient Name: [name] Date of Service: 02/19/2019 9:30 AM Medical Record Number: [id number] Patient Account Number: [id number] Date of Birth/Sex: [birth year] (73 y.o. M) Treating RN: Cornell Barman Primary Care Provider: [name] Other Clinician: Referring Provider: [name] Treating Provider/Extender: Melburn Hake, HOYT Weeks in Treatment: 1 Debridement Performed for Wound #5 Left,Lateral Foot Assessment: Performed By: Physician [name], HOYT E., [name] Debridement Type: Debridement Severity of Tissue Pre Fat layer exposed Debridement: Level of Consciousness (Pre- Awake [name] Alert procedure): Pre-procedure Verification/Time Yes - 10:05 Out Taken: Start Time: 10:05 Pain Control: Lidocaine Total Area Debrided (L x W): 0.6 (cm) x 0.7 (cm) = 0.42 (cm) Tissue [name] other material Viable, Non-Viable, Callus, Slough, Subcutaneous, Slough debrided: Level: Skin/Subcutaneous Tissue Debridement Description: Excisional Instrument: Curette Bleeding:  Minimum Hemostasis Achieved: Pressure End Time: 10:09 Procedural Pain: 0 Post Procedural Pain: 0 Response to Treatment: Procedure was tolerated well Level of Consciousness Awake [name] Alert (Post-procedure): Post Debridement Measurements of Total Wound Length: (cm) 0.6 Width: (cm) 0.7 Depth: (cm) 0.2 Volume: (cm) 0.066 Character of Wound/Ulcer Post Debridement: Improved Severity of Tissue Post Debridement: Fat layer exposed Post Procedure Diagnosis Same as Pre-procedure Electronic Signature(s) Signed: 02/19/2019 11:27:10 AM By: Worthy Keeler [name] Signed: 02/20/2019 9:44:27 AM By: Gretta Cool, BSN, RN, CWS, Kim RN, BSN Entered By: Gretta Cool, BSN, RN, CWS, Kim on 02/19/2019 10:10:05 [name] ([id number]) -------------------------------------------------------------------------------- HPI Details Patient Name: [name] Date of Service: 02/19/2019 9:30 AM Medical Record Number: [id number] Patient Account Number: [id number] Date of Birth/Sex: 02-01-1946 (73 y.o. M) Treating RN: Harold Barban Primary Care Provider: [name] Other Clinician: Referring Provider: [name] Treating Provider/Extender: Melburn Hake, HOYT Weeks in Treatment: 1 History of Present Illness HPI Description: 10/04/16 on evaluation today patient presents for initial evaluation concerning a laceration of the right wrist [name] hand on the bowler aspect. He tells me that he fell into glass following a medication change in regard to his diabetes. He initially went to [facility] where she was sutured [name] subsequently his daughter who is a Marine scientist removed the teachers at the appropriate time. Unfortunately the wound has done poorly [name] dehissed which has caused him some trouble including infection. He has been on antibiotics both topically [name] orally though the wound continues to show signs of necrotic tissue according to notes which is what he was sent to [place] for evaluation concerning. He does have  diabetes [name] unfortunately this is severely uncontrolled. He does see an endocrinologist [name] is working on this. 10/11/2016 -- the patient's notes from the ER were reviewed [name] he had his suturing done on 09/17/2016 [name] had necrotic skin flaps which had to be trimmed the last visit he was here. He has managed to get his Santyl ointment [name] has been using this  appropriately. 10/21/16 patient's wound appears to be doing very well on evaluation today. He still has some Slough covering the wound bed but this is nowhere near as severe as when i saw this initially two weeks ago nor even my review of his pictures last week. I'm pleased with how this is progressing. Patient's wound over the right form appears to be doing fairly well on evaluation today. There is no evidence of infection [name] he has a small area at the crease of where she is wrist extends [name] flexes that is still open [name] appears to be having difficulty due to the fact that it is cracking open. I think this is something that may be controlled with moisture control that is keeping the wound a little bit more moist. Nonetheless this is a tiny region compared to the initial wound [name] overall appears to be doing very well. No fevers, chills, nausea, or vomiting noted at this time. 11/25/16 on evaluation today patient appears to be doing well in regard to his right wrist ulcer. In fact this appears to be completely healed he is having no discomfort. He is pleased with how this is progressed. Readmission: 06/24/17 on evaluation today patient appears back in our clinic regarding issues that he has been having with his right ankle on the lateral malleolus as well is in the dorsal surface of his left foot. Both have been going on for several weeks unfortunately. He tells me that initially this was being managed by Dr. Edilia Bo [name] I did review the notes from Dr. Edilia Bo in epic. Patient was placed on amoxicillin initially [name] then subsequently Keflex  following. He has been using triple antibiotic ointment over-the-counter along with a Band-Aid for the wound currently. He did not have any Santyl remaining. His most recent hemoglobin A1c which was just two days ago was 10.3. Fortunately he seems to have excellent blood flow in the bilateral lower extremities with his left ABI being 0.95 in the right ABI 1.01. Patient does have some pain in regard to each of these ulcerations but he tells me it's not nearly as severe as the hand wound that I help treat earlier over the summer. This was in 2018. 07/15/17 on evaluation today patient's lower surety ulcer is actually both appear to be doing fairly well at this point. He does have a little bit of skin overlapping the ulcer on the right lateral ankle this was debrided away today just with saline [name] gauze without complication patient had no discomfort or pain. Otherwise patient's left foot ulcer does appear to be showing signs of improvement he does seem to have a right great toe. [name] noted at this point in time. I think that this does need to be managed at this point. No fevers, chills, nausea, or vomiting noted at this time. 07/22/17 on evaluation today patient's wounds in regard to his lower extremities appear to be doing somewhat better. His right ankle wound in fact appears to likely be completely closed. The left dorsal foot ulcer though still open does appear to be a little bit smaller [name] does also seem to be making good progress. He has not been having problems with the treatment at this point in the general he does not seem to show as much erythema surrounding the wound bed which is good news. He is not [name], Dustin E. ([id number]) having significant discomfort. 08/12/17 on evaluation today patients wounds of both appeared to be doing better in fact they both appear to  be completely healed. Overall I'm very pleased with how things have progressed over the past week even more than what I  expected. Obviously this is good news [name] he is happy about this. He did have a fall when going to his orthopedic doctor recently fortunately he does not appear to have injured anything severely but nonetheless does have a couple of bumps [name] bruises nothing significant at this time but he needs to be seen [name] wound care for. He may have broken his left wrist he is following up with orthopedics in that regard. Readmission: 10/24/17 on evaluation today patient presents for reevaluation after having been discharged Aug 12, 2017. His wound was healed at that point although he states that it has been somewhat discolored since that time. He has not experienced the drainage as far as the wound is concerned but the fact that it was still discolored been greater than two months after the fact made him want to come in for reevaluation. Upon inspection today the patient has no pain although he does have neuropathy. He does have a discolored region of the dorsal surface of the left foot which was the site where the ulcer was that we previously treated. The good news is he does not again have any discomfort although obviously I do believe this may be some scar tissue [name] just discoloration in that regard. Again he hasn't had any drainage or discharge since I last saw him [name] at this point I see no evidence of that either this area shows complete epithelialization. In general I do not feel like there's any significant issue at this point in that regard. 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 He has been in this clinic before in 2018 with a laceration on his right hand [name] then again in 2019 with an area over the right lateral malleolus. 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 He states that the wound is been there for the last week or 2. It  is been uncomfortable to walk on. The man is an active man having to look after an elderly mother. Past medical history, type 2 diabetes with polyneuropathy with a recent hemoglobin A1c in September 2011 0.3, lumbar disc disease, hypergonadism., Left foot drop [name] gout. We did not repeat his ABIs in the clinic quoting values from July 2019 of 0.95 [name] 1.01. He is not felt to have significant PAD 02/19/2019 on evaluation today patient actually appears to be doing better with regard to his foot wound based on what I am seeing currently. He was seen by Dr. Dellia Nims on readmission back into the clinic last week when I was off. The good news is the patient has been tolerating the dressing changes without any complication in fact has not really had much in the way of drainage. He does have an AFO brace which she believes is actually pushing on this area as well [name] has contributed to the issue. Fortunately there is no signs of active infection at this point. His ABIs appear to be well. Electronic Signature(s) Signed: 02/19/2019 10:17:36 AM By: Worthy Keeler [name] Entered By: Worthy Keeler on 02/19/2019 10:17:35 [name] ([id number]) -------------------------------------------------------------------------------- Physical Exam Details Patient Name: [name] Date of Service: 02/19/2019 9:30 AM Medical Record Number: [id number] Patient Account Number: [id number] Date of Birth/Sex: [birth year] (73  y.o. M) Treating RN: Harold Barban Primary Care Provider: [name] Other Clinician: Referring Provider: [name] Treating Provider/Extender: [name], HOYT Weeks in Treatment: 1 Constitutional Well-nourished [name] well-hydrated in no acute distress. Respiratory normal breathing without difficulty. Psychiatric this patient is able to make decisions [name] demonstrates good insight into disease process. Alert [name] Oriented x 3. pleasant [name] cooperative. Notes Upon inspection  today patient's wound bed actually showed signs of good granulation at this time there was some slough noted however on the surface of the wound which did require some sharp debridement to clear away necrotic tissue in order to prepare for dressing application. Again the layer of slough was quite thick [name] sharp debridement was necessary to clear this away. Post debridement wound bed appears to be doing excellent with a good granular bed this does not appear to go too deep. Electronic Signature(s) Signed: 02/19/2019 10:18:11 AM By: Worthy Keeler [name] Entered By: Worthy Keeler on 02/19/2019 10:18:11 [name] ([id number]) -------------------------------------------------------------------------------- Physician Orders Details Patient Name: [name] Date of Service: 02/19/2019 9:30 AM Medical Record Number: [id number] Patient Account Number: [id number] Date of Birth/Sex: 18-Jan-1946 (73 y.o. M) Treating RN: Cornell Barman Primary Care Provider: [name] Other Clinician: Referring Provider: [name] Treating Provider/Extender: Melburn Hake, HOYT Weeks in Treatment: 1 Verbal / Phone Orders: No Diagnosis Coding ICD-10 Coding Code Description E11.621 Type 2 diabetes mellitus with foot ulcer E11.42 Type 2 diabetes mellitus with diabetic polyneuropathy L97.522 Non-pressure chronic ulcer of other part of left foot with fat layer exposed M21.372 Foot drop, left foot Wound Cleansing Wound #5 Left,Lateral Foot o Clean wound with Normal Saline. o Cleanse wound with mild soap [name] water o May Shower, gently pat wound dry prior to applying new dressing. Primary Wound Dressing Wound #5 Left,Lateral Foot o Silver Collagen Secondary Dressing Wound #5 Left,Lateral Foot o Conform/Kerlix - Rolled gauze o Foam - To pad area Dressing Change Frequency Wound #5 Left,Lateral Foot o Change dressing every other day. Follow-up Appointments Wound #5 Left,Lateral  Foot o Return Appointment in 1 week. Off-Loading Wound #5 Left,Lateral Foot o Open toe surgical shoe with peg assist. - left foot Electronic Signature(s) Signed: 02/19/2019 11:27:10 AM By: Worthy Keeler [name] Signed: 02/20/2019 9:44:27 AM By: Gretta Cool, BSN, RN, CWS, Kim RN, BSN Entered By: Gretta Cool, BSN, RN, CWS, Kim on 02/19/2019 10:11:30 [name], [name] ([id number]) [name], [name] ([id number]) -------------------------------------------------------------------------------- Problem List Details Patient Name: [name] Date of Service: 02/19/2019 9:30 AM Medical Record Number: [id number] Patient Account Number: [id number] Date of Birth/Sex: [birth year] (73 y.o. M) Treating RN: Harold Barban Primary Care Provider: [name] Other Clinician: Referring Provider: [name] Treating Provider/Extender: Melburn Hake, HOYT Weeks in Treatment: 1 Active Problems ICD-10 Evaluated Encounter Code Description Active Date Today Diagnosis E11.621 Type 2 diabetes mellitus with foot ulcer 02/09/2019 No Yes E11.42 Type 2 diabetes mellitus with diabetic polyneuropathy 02/09/2019 No Yes L97.522 Non-pressure chronic ulcer of other part of left foot with fat 02/09/2019 No Yes layer exposed M21.372 Foot drop, left foot 02/09/2019 No Yes Inactive Problems Resolved Problems Electronic Signature(s) Signed: 02/19/2019 9:59:04 AM By: Worthy Keeler [name] Entered By: Worthy Keeler on 02/19/2019 09:59:03 [name] ([id number]) -------------------------------------------------------------------------------- Progress Note Details Patient Name: [name] Date of Service: 02/19/2019 9:30 AM Medical Record Number: [id number] Patient Account Number: [id number] Date of Birth/Sex: 29-Mar-1946 (73 y.o. M) Treating RN: Harold Barban Primary Care Provider: [name] Other Clinician: Referring Provider: [name] Treating Provider/Extender: Joaquim Lai  III, HOYT Weeks in  Treatment: 1 Subjective Chief Complaint Information obtained from Patient Left dorsal foot ulcer 02/09/2019; patient is here for review of the wound on the left lateral foot roughly at the level of the base of the fifth metatarsal History of Present Illness (HPI) 10/04/16 on evaluation today patient presents for initial evaluation concerning a laceration of the right wrist [name] hand on the bowler aspect. He tells me that he fell into glass following a medication change in regard to his diabetes. He initially went to [facility] where she was sutured [name] subsequently his daughter who is a Marine scientist removed the teachers at the appropriate time. Unfortunately the wound has done poorly [name] dehissed which has caused him some trouble including infection. He has been on antibiotics both topically [name] orally though the wound continues to show signs of necrotic tissue according to notes which is what he was sent to [place] for evaluation concerning. He does have diabetes [name] unfortunately this is severely uncontrolled. He does see an endocrinologist [name] is working on this. 10/11/2016 -- the patient's notes from the ER were reviewed [name] he had his suturing done on 09/17/2016 [name] had necrotic skin flaps which had to be trimmed the last visit he was here. He has managed to get his Santyl ointment [name] has been using this appropriately. 10/21/16 patient's wound appears to be doing very well on evaluation today. He still has some Slough covering the wound bed but this is nowhere near as severe as when i saw this initially two weeks ago nor even my review of his pictures last week. I'm pleased with how this is progressing. Patient's wound over the right form appears to be doing fairly well on evaluation today. There is no evidence of infection [name] he has a small area at the crease of where she is wrist extends [name] flexes that is still open [name] appears to be having difficulty due to the fact that it is cracking open. I  think this is something that may be controlled with moisture control that is keeping the wound a little bit more moist. Nonetheless this is a tiny region compared to the initial wound [name] overall appears to be doing very well. No fevers, chills, nausea, or vomiting noted at this time. 11/25/16 on evaluation today patient appears to be doing well in regard to his right wrist ulcer. In fact this appears to be completely healed he is having no discomfort. He is pleased with how this is progressed. Readmission: 06/24/17 on evaluation today patient appears back in our clinic regarding issues that he has been having with his right ankle on the lateral malleolus as well is in the dorsal surface of his left foot. Both have been going on for several weeks unfortunately. He tells me that initially this was being managed by Dr. Edilia Bo [name] I did review the notes from Dr. Edilia Bo in epic. Patient was placed on amoxicillin initially [name] then subsequently Keflex following. He has been using triple antibiotic ointment over-the-counter along with a Band-Aid for the wound currently. He did not have any Santyl remaining. His most recent hemoglobin A1c which was just two days ago was 10.3. Fortunately he seems to have excellent blood flow in the bilateral lower extremities with his left ABI being 0.95 in the right ABI 1.01. Patient does have some pain in regard to each of these ulcerations but he tells me it's not nearly as severe as the hand wound that I help treat earlier  over the summer. This was in 2018. 07/15/17 on evaluation today patient's lower surety ulcer is actually both appear to be doing fairly well at this point. He does have a little bit of skin overlapping the ulcer on the right lateral ankle this was debrided away today just with saline [name] gauze without complication patient had no discomfort or pain. Otherwise patient's left foot ulcer does appear to be showing signs of [name], ABURTO.  ([id number]) improvement he does seem to have a right great toe. [name] noted at this point in time. I think that this does need to be managed at this point. No fevers, chills, nausea, or vomiting noted at this time. 07/22/17 on evaluation today patient's wounds in regard to his lower extremities appear to be doing somewhat better. His right ankle wound in fact appears to likely be completely closed. The left dorsal foot ulcer though still open does appear to be a little bit smaller [name] does also seem to be making good progress. He has not been having problems with the treatment at this point in the general he does not seem to show as much erythema surrounding the wound bed which is good news. He is not having significant discomfort. 08/12/17 on evaluation today patients wounds of both appeared to be doing better in fact they both appear to be completely healed. Overall I'm very pleased with how things have progressed over the past week even more than what I expected. Obviously this is good news [name] he is happy about this. He did have a fall when going to his orthopedic doctor recently fortunately he does not appear to have injured anything severely but nonetheless does have a couple of bumps [name] bruises nothing significant at this time but he needs to be seen [name] wound care for. He may have broken his left wrist he is following up with orthopedics in that regard. Readmission: 10/24/17 on evaluation today patient presents for reevaluation after having been discharged Aug 12, 2017. His wound was healed at that point although he states that it has been somewhat discolored since that time. He has not experienced the drainage as far as the wound is concerned but the fact that it was still discolored been greater than two months after the fact made him want to come in for reevaluation. Upon inspection today the patient has no pain although he does have neuropathy. He does have a discolored region of the  dorsal surface of the left foot which was the site where the ulcer was that we previously treated. The good news is he does not again have any discomfort although obviously I do believe this may be some scar tissue [name] just discoloration in that regard. Again he hasn't had any drainage or discharge since I last saw him [name] at this point I see no evidence of that either this area shows complete epithelialization. In general I do not feel like there's any significant issue at this point in that regard. READMISSION 02/09/2019 This is a 73 year old man with type 2 diabetes poorly controlled with significant peripheral neuropathy. He has been in this clinic before in 2018 with a laceration on his right hand [name] then again in 2019 with an area over the right lateral malleolus. 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 He states that the wound is been there for the last week or 2. It is been  uncomfortable to walk on. The man is an active man having to look after an elderly mother. Past medical history, type 2 diabetes with polyneuropathy with a recent hemoglobin A1c in September 2011 0.3, lumbar disc disease, hypergonadism., Left foot drop [name] gout. We did not repeat his ABIs in the clinic quoting values from July 2019 of 0.95 [name] 1.01. He is not felt to have significant PAD 02/19/2019 on evaluation today patient actually appears to be doing better with regard to his foot wound based on what I am seeing currently. He was seen by Dr. Dellia Nims on readmission back into the clinic last week when I was off. The good news is the patient has been tolerating the dressing changes without any complication in fact has not really had much in the way of drainage. He does have an AFO brace which she believes is actually pushing on this area as well [name] has contributed to the issue. Fortunately there is no signs of active infection at this  point. His ABIs appear to be well. Patient History Information obtained from Patient. Family History Cancer - Siblings, Diabetes - Father, Hypertension - Father, Kidney Disease - Mother, Stroke - Father, No family history of Heart Disease, Lung Disease, Thyroid Problems, Tuberculosis. Social History Former smoker, Marital Status - Widowed, Alcohol Use - Never, Drug Use - No History, Caffeine Use - Moderate. Medical History DURWARD, MATRANGA ([id number]) Eyes Patient has history of Cataracts - 2018 Ear/Nose/Mouth/Throat Patient has history of Middle ear problems Denies history of Chronic sinus problems/congestion Hematologic/Lymphatic Denies history of Anemia, Hemophilia, Human Immunodeficiency Virus, Lymphedema, Sickle Cell Disease Respiratory Patient has history of Sleep Apnea Denies history of Aspiration, Asthma, Chronic Obstructive Pulmonary Disease (COPD), Pneumothorax, Tuberculosis Cardiovascular Patient has history of Hypertension Denies history of Angina, Arrhythmia, Congestive Heart Failure, Coronary Artery Disease, Deep Vein Thrombosis, Hypotension, Myocardial Infarction, Peripheral Arterial Disease, Peripheral Venous Disease, Phlebitis, Vasculitis Gastrointestinal Denies history of Cirrhosis , Colitis, Crohn s, Hepatitis A, Hepatitis B, Hepatitis C Endocrine Patient has history of Type II Diabetes Genitourinary Denies history of End Stage Renal Disease Immunological Denies history of Lupus Erythematosus, Raynaud s, Scleroderma Musculoskeletal Patient has history of Gout - history, Osteoarthritis Denies history of Rheumatoid Arthritis, Osteomyelitis Neurologic Patient has history of Neuropathy Denies history of Dementia, Quadriplegia, Paraplegia, Seizure Disorder Oncologic Denies history of Received Chemotherapy, Received Radiation Medical [name] Surgical History Notes Cardiovascular CVA in 2015 Genitourinary "weak kidneys" - is a patient at [facility] Review of  Systems (ROS) Constitutional Symptoms (General Health) Denies complaints or symptoms of Fatigue, Fever, Chills, Marked Weight Change. Respiratory Denies complaints or symptoms of Chronic or frequent coughs, Shortness of Breath. Cardiovascular Denies complaints or symptoms of Chest pain, LE edema. Psychiatric Denies complaints or symptoms of Anxiety, Claustrophobia. Objective Constitutional Well-nourished [name] well-hydrated in no acute distress. [name], [name] ([id number]) Vitals Time Taken: 9:32 AM, Height: 71 in, Weight: 205 lbs, BMI: 28.6, Temperature: 98.6 F, Pulse: 89 bpm, Respiratory Rate: 18 breaths/min, Blood Pressure: 164/72 mmHg. General Notes: Patient states he has not taken BP meds today. Respiratory normal breathing without difficulty. Psychiatric this patient is able to make decisions [name] demonstrates good insight into disease process. Alert [name] Oriented x 3. pleasant [name] cooperative. General Notes: Upon inspection today patient's wound bed actually showed signs of good granulation at this time there was some slough noted however on the surface of the wound which did require some sharp debridement to clear away necrotic tissue in order to prepare for dressing application. Again  the layer of slough was quite thick [name] sharp debridement was necessary to clear this away. Post debridement wound bed appears to be doing excellent with a good granular bed this does not appear to go too deep. Integumentary (Hair, Skin) Wound #5 status is Open. Original cause of wound was Pressure Injury. The wound is located on the Left,Lateral Foot. The wound measures 0.6cm length x 0.7cm width x 0.2cm depth; 0.33cm^2 area [name] 0.066cm^3 volume. There is Fat Layer (Subcutaneous Tissue) Exposed exposed. There is no tunneling or undermining noted. There is a medium amount of serous drainage noted. The wound margin is thickened. There is small (1-33%) pale granulation within the wound bed. There is  a large (67-100%) amount of necrotic tissue within the wound bed including Adherent Slough. 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 There was a Excisional Skin/Subcutaneous Tissue Debridement with a total area of 0.42 sq cm performed by [name], HOYT E., [name]. With the following instrument(s): Curette to remove Viable [name] Non-Viable tissue/material. Material removed includes Callus, Subcutaneous Tissue, [name] Slough after achieving pain control using Lidocaine. No specimens were taken. A time out was conducted at 10:05, prior to the start of the procedure. A Minimum amount of bleeding was controlled with Pressure. The procedure was tolerated well with a pain level of 0 throughout [name] a pain level of 0 following the procedure. Post Debridement Measurements: 0.6cm length x 0.7cm width x 0.2cm depth; 0.066cm^3 volume. Character of Wound/Ulcer Post Debridement is improved. Severity of Tissue Post Debridement is: Fat layer exposed. Post procedure Diagnosis Wound #5: Same as Pre-Procedure [name], CHEVEZ. ([id number]) Plan Wound Cleansing: Wound #5 Left,Lateral Foot: Clean wound with Normal Saline. Cleanse wound with mild soap [name] water May Shower, gently pat wound dry prior to applying new dressing. Primary Wound Dressing: Wound #5 Left,Lateral Foot: Silver Collagen Secondary Dressing: Wound #5 Left,Lateral Foot: Conform/Kerlix - Rolled gauze Foam - To pad area Dressing Change Frequency: Wound #5 Left,Lateral Foot: Change dressing every other day. Follow-up Appointments: Wound #5 Left,Lateral Foot: Return  Appointment in 1 week. Off-Loading: Wound #5 Left,Lateral Foot: Open toe surgical shoe with peg assist. - left foot 1. My suggestion at this point based on what I am seeing is that we will get a likely want to switch to a collagen based dressing he really had little to no drainage on his current dressing [name] I feel like the alginate may be just keeping the area to dry. 2. I am also going to suggest at this time that we go ahead [name] continue with attempting offloading he cannot really use the PEG assist shoe as that was causing him to trip he is afraid that he was going to fall likely this is secondary to the foot drop that he was having such issues. Obviously I completely understand that issue. Nonetheless he is not able to wear it so for the time being we are probably going to have to just consider padding this area really well with the dressings [name] have him use his regular shoe. 3. With regard to the foot drop in the AFO that he is supposed to be wearing I think this likely  needs to be adjusted although he might just need something altogether different. That is something that I would defer to Dr. Gladstone Lighter at [facility] [name] the patient states that he can get in to see him at any point he wants to try to get the wound healed however before he goes back into talk to him concerning this. We will see patient back for reevaluation in 1 week here in the clinic. If anything worsens or changes patient will contact our office for additional recommendations. Electronic Signature(s) Signed: 02/19/2019 10:19:42 AM By: Worthy Keeler [name] Entered By: Worthy Keeler on 02/19/2019 10:19:41 [name] ([id number]) -------------------------------------------------------------------------------- ROS/PFSH Details Patient Name: [name] Date of Service: 02/19/2019 9:30 AM Medical Record Number: [id number] Patient Account Number: [id number] Date of Birth/Sex: Oct 07, 1945 (73 y.o.  M) Treating RN: Harold Barban Primary Care Provider: [name] Other Clinician: Referring Provider: [name] Treating Provider/Extender: Melburn Hake, HOYT Weeks in Treatment: 1 Information Obtained From Patient Constitutional Symptoms (General Health) Complaints [name] Symptoms: Negative for: Fatigue; Fever; Chills; Marked Weight Change Respiratory Complaints [name] Symptoms: Negative for: Chronic or frequent coughs; Shortness of Breath Medical History: Positive for: Sleep Apnea Negative for: Aspiration; Asthma; Chronic Obstructive Pulmonary Disease (COPD); Pneumothorax; Tuberculosis Cardiovascular Complaints [name] Symptoms: Negative for: Chest pain; LE edema Medical History: Positive for: Hypertension Negative for: Angina; Arrhythmia; Congestive Heart Failure; Coronary Artery Disease; Deep Vein Thrombosis; Hypotension; Myocardial Infarction; Peripheral Arterial Disease; Peripheral Venous Disease; Phlebitis; Vasculitis Past Medical History Notes: CVA in 2015 Psychiatric Complaints [name] Symptoms: Negative for: Anxiety; Claustrophobia Eyes Medical History: Positive for: Cataracts - 2018 Ear/Nose/Mouth/Throat Medical History: Positive for: Middle ear problems Negative for: Chronic sinus problems/congestion Hematologic/Lymphatic Medical History: Negative for: Anemia; Hemophilia; Human Immunodeficiency Virus; Lymphedema; Sickle Cell Disease [name], [name] ([id number]) Gastrointestinal Medical History: Negative for: Cirrhosis ; Colitis; Crohnos; Hepatitis A; Hepatitis B; Hepatitis C Endocrine Medical History: Positive for: Type II Diabetes Time with diabetes: 20 yrs Treated with: Insulin Blood sugar tested every day: Yes Tested : TID Blood sugar testing results: Bedtime: 130 Genitourinary Medical History: Negative for: End Stage Renal Disease Past Medical History Notes: "weak kidneys" - is a patient at [facility]  History: Negative for: Lupus Erythematosus; Raynaudos; Scleroderma Musculoskeletal Medical History: Positive for: Gout - history; Osteoarthritis Negative for: Rheumatoid Arthritis; Osteomyelitis Neurologic Medical History: Positive for: Neuropathy Negative for: Dementia; Quadriplegia; Paraplegia; Seizure Disorder Oncologic Medical History: Negative for: Received Chemotherapy; Received Radiation HBO Extended History Items Eyes: Ear/Nose/Mouth/Throat: Cataracts Middle ear problems Immunizations Pneumococcal Vaccine: Received Pneumococcal Vaccination: Yes Immunization Notes: up to date Implantable Devices None Family [name] Social History [name], [name] ([id number]) Cancer: Yes - Siblings; Diabetes: Yes - Father; Heart Disease: No; Hypertension: Yes - Father; Kidney Disease: Yes - Mother; Lung Disease: No; Stroke: Yes - Father; Thyroid Problems: No; Tuberculosis: No; Former smoker; Marital Status - Widowed; Alcohol Use: Never; Drug Use: No History; Caffeine Use: Moderate; Financial Concerns: No; Food, Clothing or Shelter Needs: No; Support System Lacking: No; Transportation Concerns: No Physician Affirmation I have reviewed [name] agree with the above information. Electronic Signature(s) Signed: 02/19/2019 11:27:10 AM By: Worthy Keeler [name] Signed: 02/19/2019 4:23:10 PM By: Harold Barban Entered By: Worthy Keeler on 02/19/2019 10:17:51 [name] ([id number]) -------------------------------------------------------------------------------- SuperBill Details Patient Name: [name] Date of Service: 02/19/2019 Medical Record Number: [id number] Patient Account Number: [id number] Date of Birth/Sex: 1945-09-13 (73 y.o. M) Treating RN: Harold Barban Primary Care Provider: [name] Other Clinician: Referring Provider: Lorelei Pont,  SPENCER Treating Provider/Extender: [name], HOYT Weeks in Treatment: 1 Diagnosis Coding ICD-10 Codes Code Description E11.621  Type 2 diabetes mellitus with foot ulcer E11.42 Type 2 diabetes mellitus with diabetic polyneuropathy L97.522 Non-pressure chronic ulcer of other part of left foot with fat layer exposed M21.372 Foot drop, left foot Facility Procedures CPT4 Code: 79432761 Description: 47092 - DEB SUBQ TISSUE 20 SQ CM/< ICD-10 Diagnosis Description L97.522 Non-pressure chronic ulcer of other part of left foot with fat Modifier: layer exposed Quantity: 1 Physician Procedures CPT4 Code: 9574734 Description: 03709 - WC PHYS SUBQ TISS 20 SQ CM ICD-10 Diagnosis Description L97.522 Non-pressure chronic ulcer of other part of left foot with fat Modifier: layer exposed Quantity: 1 Electronic Signature(s) Signed: 02/19/2019 10:19:57 AM By: Worthy Keeler [name] Entered By: Worthy Keeler on 02/19/2019 10:19:57

## 2019-02-20 NOTE — Progress Notes (Signed)
RONNIE, DOO ([id number]) Visit Report for 02/19/2019 Arrival Information Details Patient Name: [name], Dustin Date of Service: 02/19/2019 9:30 AM Medical Record Number: [id number] Patient Account Number: [id number] Date of Birth/Sex: 1946/02/17 (73 y.o. M) Treating RN: Cornell Barman Primary Care Darshan Solanki: [name] Other Clinician: Referring Marcela Alatorre: [name] Treating Ione Sandusky/Extender: Melburn Hake, HOYT Weeks in Treatment: 1 Visit Information History Since Last Visit Added or deleted any medications: No Patient Arrived: Ambulatory Any new allergies or adverse reactions: No Arrival Time: 09:31 Had a fall or experienced change in No Accompanied By: self activities of daily living that may affect Transfer Assistance: None risk of falls: Patient Identification Verified: Yes Signs or symptoms of abuse/neglect since last No Secondary Verification Process Completed: Yes visito Patient Has Alerts: Yes Hospitalized since last visit: No Patient Alerts: DMII Implantable device outside of the clinic No excluding cellular tissue based products placed in the center since last visit: Has Dressing in Place as Prescribed: Yes Has Footwear/Offloading in Place as No Prescribed: Left: Surgical Shoe with Pressure Relief Insole Pain Present Now: No Notes Patient states he trippped 3 times with peg assist shoe. Is not wearing it at this time. Electronic Signature(s) Signed: 02/20/2019 9:44:27 AM By: Gretta Cool, BSN, RN, CWS, Kim RN, BSN Entered By: Gretta Cool, BSN, RN, CWS, Kim on 02/19/2019 09:32:23 [name] ([id number]) -------------------------------------------------------------------------------- Encounter Discharge Information Details Patient Name: [name] Date of Service: 02/19/2019 9:30 AM Medical Record Number: [id number] Patient Account Number: [id number] Date of Birth/Sex: 1945-07-21 (73 y.o. M) Treating RN: Cornell Barman Primary Care Laren Orama: [name] Other Clinician: Referring Srishti Strnad: [name] Treating Larosa Rhines/Extender: Melburn Hake, HOYT Weeks in Treatment: 1 Encounter Discharge Information Items Post Procedure Vitals Discharge Condition: Stable Temperature (F): 98.6 Ambulatory Status: Walker Pulse (bpm): 89 Discharge Destination: Home Respiratory Rate (breaths/min): 18 Transportation: Private Auto Blood Pressure (mmHg): 164/72 Accompanied By: self Schedule Follow-up Appointment: Yes Clinical Summary of Care: Electronic Signature(s) Signed: 02/20/2019 9:44:27 AM By: Gretta Cool, BSN, RN, CWS, Kim RN, BSN Entered By: Gretta Cool, BSN, RN, CWS, Kim on 02/19/2019 10:13:19 [name] ([id number]) -------------------------------------------------------------------------------- Lower Extremity Assessment Details Patient Name: [name] Date of Service: 02/19/2019 9:30 AM Medical Record Number: [id number] Patient Account Number: [id number] Date of Birth/Sex: 1945-12-23 (73 y.o. M) Treating RN: Cornell Barman Primary Care Benna Arno: [name] Other Clinician: Referring Suzanne Kho: [name] Treating Vito Beg/Extender: Melburn Hake, HOYT Weeks in Treatment: 1 Edema Assessment Assessed: [Left: No] [Right: No] [Left: Edema] [Right: :] Calf Left: Right: Point of Measurement: 34 cm From Medial Instep 35 cm cm Ankle Left: Right: Point of Measurement: 11 cm From Medial Instep 21 cm cm Vascular Assessment Pulses: Dorsalis Pedis Palpable: [Left:Yes] Electronic Signature(s) Signed: 02/20/2019 9:44:27 AM By: Gretta Cool, BSN, RN, CWS, Kim RN, BSN Entered By: Gretta Cool, BSN, RN, CWS, Kim on 02/19/2019 09:39:13 [name] ([id number]) -------------------------------------------------------------------------------- Multi Wound Chart Details Patient Name: [name] Date of Service: 02/19/2019 9:30 AM Medical Record Number: [id number] Patient Account Number: [id number] Date of Birth/Sex: 1946-01-08 (73 y.o.  M) Treating RN: Cornell Barman Primary Care Gentry Pilson: [name] Other Clinician: Referring Maysoon Lozada: [name] Treating Kati Riggenbach/Extender: Melburn Hake, HOYT Weeks in Treatment: 1 Vital Signs Height(in): 71 Pulse(bpm): 89 Weight(lbs): 205 Blood Pressure(mmHg): 164/72 Body Mass Index(BMI): 29 Temperature(F): 98.6 Respiratory Rate 18 (breaths/min): Photos: [N/A:N/A] Wound Location: Left Foot - Lateral N/A N/A Wounding Event: Pressure Injury N/A N/A Primary Etiology: Diabetic Wound/Ulcer of the N/A N/A Lower Extremity Comorbid History: Cataracts, Middle ear N/A N/A problems, Sleep Apnea,  Hypertension, Type II Diabetes, Gout, Osteoarthritis, Neuropathy Date Acquired: 01/19/2019 N/A N/A Weeks of Treatment: 1 N/A N/A Wound Status: Open N/A N/A Measurements L x W x D 0.6x0.7x0.2 N/A N/A (cm) Area (cm) : 0.33 N/A N/A Volume (cm) : 0.066 N/A N/A % Reduction in Area: -20.00% N/A N/A % Reduction in Volume: 19.50% N/A N/A Classification: Grade 2 N/A N/A Exudate Amount: Medium N/A N/A Exudate Type: Serous N/A N/A Exudate Color: amber N/A N/A Wound Margin: Thickened N/A N/A Granulation Amount: Small (1-33%) N/A N/A Granulation Quality: Pale N/A N/A Necrotic Amount: Large (67-100%) N/A N/A Exposed Structures: Fat Layer (Subcutaneous N/A N/A Tissue) Exposed: Yes Fascia: No DURWIN, DAVISSON ([id number]) Tendon: No Muscle: No Joint: No Bone: No Epithelialization: Small (1-33%) N/A N/A Treatment Notes Electronic Signature(s) Signed: 02/20/2019 9:44:27 AM By: Gretta Cool, BSN, RN, CWS, Kim RN, BSN Entered By: Gretta Cool, BSN, RN, CWS, Kim on 02/19/2019 10:03:55 [name] ([id number]) -------------------------------------------------------------------------------- Multi-Disciplinary Care Plan Details Patient Name: [name] Date of Service: 02/19/2019 9:30 AM Medical Record Number: [id number] Patient Account Number: [id number] Date of Birth/Sex: December 01, 1945 (73 y.o.  M) Treating RN: Cornell Barman Primary Care Rohith Fauth: [name] Other Clinician: Referring Robet Crutchfield: [name] Treating Chariah Bailey/Extender: Sharalyn Ink in Treatment: 1 Active Inactive Electronic Signature(s) Signed: 02/20/2019 9:44:27 AM By: Gretta Cool, BSN, RN, CWS, Kim RN, BSN Entered By: Gretta Cool, BSN, RN, CWS, Kim on 02/19/2019 10:03:34 [name] ([id number]) -------------------------------------------------------------------------------- Pain Assessment Details Patient Name: [name] Date of Service: 02/19/2019 9:30 AM Medical Record Number: [id number] Patient Account Number: [id number] Date of Birth/Sex: 04-Feb-1946 (73 y.o. M) Treating RN: Cornell Barman Primary Care Susano Cleckler: [name] Other Clinician: Referring Alleene Stoy: [name] Treating Elayne Gruver/Extender: Melburn Hake, HOYT Weeks in Treatment: 1 Active Problems Location of Pain Severity and Description of Pain Patient Has Paino No Site Locations Pain Management and Medication Current Pain Management: Goals for Pain Management Patient denies pain at this time. Electronic Signature(s) Signed: 02/20/2019 9:44:27 AM By: Gretta Cool, BSN, RN, CWS, Kim RN, BSN Entered By: Gretta Cool, BSN, RN, CWS, Kim on 02/19/2019 09:32:56 [name] ([id number]) -------------------------------------------------------------------------------- Patient/Caregiver Education Details Patient Name: [name] Date of Service: 02/19/2019 9:30 AM Medical Record Number: [id number] Patient Account Number: [id number] Date of Birth/Gender: 03/21/1946 (73 y.o. M) Treating RN: Cornell Barman Primary Care Physician: [name] Other Clinician: Referring Physician: [name] Treating Physician/Extender: Sharalyn Ink in Treatment: 1 Education Assessment Education Provided To: Patient Education Topics Provided Wound/Skin Impairment: Handouts: Caring for Your Ulcer Methods: Demonstration,  Explain/Verbal Responses: State content correctly Electronic Signature(s) Signed: 02/20/2019 9:44:27 AM By: Gretta Cool, BSN, RN, CWS, Kim RN, BSN Entered By: Gretta Cool, BSN, RN, CWS, Kim on 02/19/2019 10:04:09 [name] ([id number]) -------------------------------------------------------------------------------- Wound Assessment Details Patient Name: [name] Date of Service: 02/19/2019 9:30 AM Medical Record Number: [id number] Patient Account Number: [id number] Date of Birth/Sex: 1945/10/12 (73 y.o. M) Treating RN: Cornell Barman Primary Care Chiante Peden: [name] Other Clinician: Referring Libbey Duce: [name] Treating Dhaval Woo/Extender: Melburn Hake, HOYT Weeks in Treatment: 1 Wound Status Wound Number: 5 Primary Diabetic Wound/Ulcer of the Lower Extremity Etiology: Wound Location: Left Foot - Lateral Wound Open Wounding Event: Pressure Injury Status: Date Acquired: 01/19/2019 Comorbid Cataracts, Middle ear problems, Sleep Apnea, Weeks Of Treatment: 1 History: Hypertension, Type II Diabetes, Gout, Clustered Wound: No Osteoarthritis, Neuropathy Photos Wound Measurements Length: (cm) 0.6 % Reduction i Width: (cm) 0.7 % Reduction i Depth: (cm) 0.2 Epithelializa Area: (cm) 0.33 Tunneling: Volume: (cm) 0.066 Undermining: n  Area: -20% n Volume: 19.5% tion: Small (1-33%) No No Wound Description Classification: Grade 2 Foul Odor Aft Wound Margin: Thickened Slough/Fibrin Exudate Amount: Medium Exudate Type: Serous Exudate Color: amber er Cleansing: No o Yes Wound Bed Granulation Amount: Small (1-33%) Exposed Structure Granulation Quality: Pale Fascia Exposed: No Necrotic Amount: Large (67-100%) Fat Layer (Subcutaneous Tissue) Exposed: Yes Necrotic Quality: Adherent Slough Tendon Exposed: No Muscle Exposed: No Joint Exposed: No Bone Exposed: No Treatment Notes VYRON, FRONCZAK ([id number]) Wound #5 (Left, Lateral Foot) Notes Silver collagen, Foam to  apd, rolled gauze with peg assist shoe Electronic Signature(s) Signed: 02/20/2019 9:44:27 AM By: Gretta Cool, BSN, RN, CWS, Kim RN, BSN Entered By: Gretta Cool, BSN, RN, CWS, Kim on 02/19/2019 09:37:44 [name] ([id number]) -------------------------------------------------------------------------------- Vitals Details Patient Name: [name] Date of Service: 02/19/2019 9:30 AM Medical Record Number: [id number] Patient Account Number: [id number] Date of Birth/Sex: Jul 15, 1945 (73 y.o. M) Treating RN: Cornell Barman Primary Care Makalia Bare: [name] Other Clinician: Referring Madelin Weseman: [name] Treating Aina Rossbach/Extender: Melburn Hake, HOYT Weeks in Treatment: 1 Vital Signs Time Taken: 09:32 Temperature (F): 98.6 Height (in): 71 Pulse (bpm): 89 Weight (lbs): 205 Respiratory Rate (breaths/min): 18 Body Mass Index (BMI): 28.6 Blood Pressure (mmHg): 164/72 Reference Range: 80 - 120 mg / dl Notes Patient states he has not taken BP meds today. Electronic Signature(s) Signed: 02/20/2019 9:44:27 AM By: Gretta Cool, BSN, RN, CWS, Kim RN, BSN Entered By: Gretta Cool, BSN, RN, CWS, Kim on 02/19/2019 09:34:35

## 2019-02-21 ENCOUNTER — Ambulatory Visit (INDEPENDENT_AMBULATORY_CARE_PROVIDER_SITE_OTHER): Payer: Medicare Other

## 2019-02-21 DIAGNOSIS — E291 Testicular hypofunction: Secondary | ICD-10-CM | POA: Diagnosis not present

## 2019-02-21 NOTE — Progress Notes (Signed)
Per orders of Dr. Frederico Hamman Copland, injection of Testosterone given by Lurlean Nanny.  Patient tolerated injection well.

## 2019-03-05 ENCOUNTER — Other Ambulatory Visit: Payer: Self-pay

## 2019-03-05 ENCOUNTER — Encounter: Payer: Medicare Other | Admitting: Physician Assistant

## 2019-03-05 DIAGNOSIS — Z87891 Personal history of nicotine dependence: Secondary | ICD-10-CM | POA: Diagnosis not present

## 2019-03-05 DIAGNOSIS — M21372 Foot drop, left foot: Secondary | ICD-10-CM | POA: Diagnosis not present

## 2019-03-05 DIAGNOSIS — E1142 Type 2 diabetes mellitus with diabetic polyneuropathy: Secondary | ICD-10-CM | POA: Diagnosis not present

## 2019-03-05 DIAGNOSIS — L97522 Non-pressure chronic ulcer of other part of left foot with fat layer exposed: Secondary | ICD-10-CM | POA: Diagnosis not present

## 2019-03-05 DIAGNOSIS — E11621 Type 2 diabetes mellitus with foot ulcer: Secondary | ICD-10-CM | POA: Diagnosis not present

## 2019-03-05 DIAGNOSIS — I10 Essential (primary) hypertension: Secondary | ICD-10-CM | POA: Diagnosis not present

## 2019-03-05 NOTE — Progress Notes (Signed)
FLEET, HIGHAM ([id number]) Visit Report for 03/05/2019 Chief Complaint Document Details Patient Name: [name], [name] Date of Service: 03/05/2019 12:45 PM Medical Record Number: [id number] Patient Account Number: [id number] Date of Birth/Sex: Aug 14, 1945 (73 y.o. M) Treating RN: Harold Barban Primary Care Provider: [name] Other Clinician: Referring Provider: [name] Treating Provider/Extender: Melburn Hake, HOYT Weeks in Treatment: 3 Information Obtained from: Patient Chief Complaint Left dorsal foot ulcer 02/09/2019; patient is here for review of the wound on the left lateral foot roughly at the level of the base of the fifth metatarsal Electronic Signature(s) Signed: 03/05/2019 1:01:06 PM By: Worthy Keeler [name] Entered By: Worthy Keeler on 03/05/2019 13:01:06 [name] ([id number]) -------------------------------------------------------------------------------- Debridement Details Patient Name: [name] Date of Service: 03/05/2019 12:45 PM Medical Record Number: [id number] Patient Account Number: [id number] Date of Birth/Sex: May 31, 1945 (73 y.o. M) Treating RN: Harold Barban Primary Care Provider: [name] Other Clinician: Referring Provider: [name] Treating Provider/Extender: Melburn Hake, HOYT Weeks in Treatment: 3 Debridement Performed for Wound #5 Left,Lateral Foot Assessment: Performed By: Physician [name], HOYT E., [name] Debridement Type: Debridement Severity of Tissue Pre Fat layer exposed Debridement: Level of Consciousness (Pre- Awake and Alert procedure): Pre-procedure Verification/Time Yes - 13:05 Out Taken: Start Time: 13:05 Pain Control: Lidocaine Total Area Debrided (L x W): 0.5 (cm) x 0.7 (cm) = 0.35 (cm) Tissue and other material Viable, Non-Viable, Callus, Slough, Subcutaneous, Slough debrided: Level: Skin/Subcutaneous Tissue Debridement Description: Excisional Instrument:  Curette Bleeding: None End Time: 13:09 Procedural Pain: 0 Post Procedural Pain: 0 Response to Treatment: Procedure was tolerated well Level of Consciousness Awake and Alert (Post-procedure): Post Debridement Measurements of Total Wound Length: (cm) 0.5 Width: (cm) 0.7 Depth: (cm) 0.1 Volume: (cm) 0.027 Character of Wound/Ulcer Post Debridement: Improved Severity of Tissue Post Debridement: Fat layer exposed Post Procedure Diagnosis Same as Pre-procedure Electronic Signature(s) Unsigned Entered By: Harold Barban on 03/05/2019 13:08:32 Signature(s): Date(s): [name] ([id number]) -------------------------------------------------------------------------------- HPI Details Patient Name: [name] Date of Service: 03/05/2019 12:45 PM Medical Record Number: [id number] Patient Account Number: [id number] Date of Birth/Sex: 09-Feb-1946 (73 y.o. M) Treating RN: Harold Barban Primary Care Provider: [name] Other Clinician: Referring Provider: [name] Treating Provider/Extender: Melburn Hake, HOYT Weeks in Treatment: 3 History of Present Illness HPI Description: 10/04/16 on evaluation today patient presents for initial evaluation concerning a laceration of the right wrist and hand on the bowler aspect. He tells me that he fell into glass following a medication change in regard to his diabetes. He initially went to [facility] ER where she was sutured and subsequently his daughter who is a Marine scientist removed the teachers at the appropriate time. Unfortunately the wound has done poorly and dehissed which has caused him some trouble including infection. He has been on antibiotics both topically and orally though the wound continues to show signs of necrotic tissue according to notes which is what he was sent to [place] for evaluation concerning. He does have diabetes and unfortunately this is severely uncontrolled. He does see an endocrinologist and is working on  this. 10/11/2016 -- the patient's notes from the ER were reviewed and he had his suturing done on 09/17/2016 and had necrotic skin flaps which had to be trimmed the last visit he was here. He has managed to get his Santyl ointment and has been using this appropriately. 10/21/16 patient's wound appears to be doing very well on evaluation today. He still has some Slough covering the wound bed but this  is nowhere near as severe as when i saw this initially two weeks ago nor even my review of his pictures last week. I'm pleased with how this is progressing. Patient's wound over the right form appears to be doing fairly well on evaluation today. There is no evidence of infection and he has a small area at the crease of where she is wrist extends and flexes that is still open and appears to be having difficulty due to the fact that it is cracking open. I think this is something that may be controlled with moisture control that is keeping the wound a little bit more moist. Nonetheless this is a tiny region compared to the initial wound and overall appears to be doing very well. No fevers, chills, nausea, or vomiting noted at this time. 11/25/16 on evaluation today patient appears to be doing well in regard to his right wrist ulcer. In fact this appears to be completely healed he is having no discomfort. He is pleased with how this is progressed. Readmission: 06/24/17 on evaluation today patient appears back in our clinic regarding issues that he has been having with his right ankle on the lateral malleolus as well is in the dorsal surface of his left foot. Both have been going on for several weeks unfortunately. He tells me that initially this was being managed by Dr. Edilia Bo and I did review the notes from Dr. Edilia Bo in epic. Patient was placed on amoxicillin initially and then subsequently Keflex following. He has been using triple antibiotic ointment over-the-counter along with a Band-Aid for the  wound currently. He did not have any Santyl remaining. His most recent hemoglobin A1c which was just two days ago was 10.3. Fortunately he seems to have excellent blood flow in the bilateral lower extremities with his left ABI being 0.95 in the right ABI 1.01. Patient does have some pain in regard to each of these ulcerations but he tells me it's not nearly as severe as the hand wound that I help treat earlier over the summer. This was in 2018. 07/15/17 on evaluation today patient's lower surety ulcer is actually both appear to be doing fairly well at this point. He does have a little bit of skin overlapping the ulcer on the right lateral ankle this was debrided away today just with saline and gauze without complication patient had no discomfort or pain. Otherwise patient's left foot ulcer does appear to be showing signs of improvement he does seem to have a right great toe. Nikki and noted at this point in time. I think that this does need to be managed at this point. No fevers, chills, nausea, or vomiting noted at this time. 07/22/17 on evaluation today patient's wounds in regard to his lower extremities appear to be doing somewhat better. His right ankle wound in fact appears to likely be completely closed. The left dorsal foot ulcer though still open does appear to be a little bit smaller and does also seem to be making good progress. He has not been having problems with the treatment at this point in the general he does not seem to show as much erythema surrounding the wound bed which is good news. He is not BLANTON, KARDELL E. ([id number]) having significant discomfort. 08/12/17 on evaluation today patients wounds of both appeared to be doing better in fact they both appear to be completely healed. Overall I'm very pleased with how things have progressed over the past week even more than what I expected. Obviously this  is good news and he is happy about this. He did have a fall when going to his  orthopedic doctor recently fortunately he does not appear to have injured anything severely but nonetheless does have a couple of bumps and bruises nothing significant at this time but he needs to be seen and wound care for. He may have broken his left wrist he is following up with orthopedics in that regard. Readmission: 10/24/17 on evaluation today patient presents for reevaluation after having been discharged Aug 12, 2017. His wound was healed at that point although he states that it has been somewhat discolored since that time. He has not experienced the drainage as far as the wound is concerned but the fact that it was still discolored been greater than two months after the fact made him want to come in for reevaluation. Upon inspection today the patient has no pain although he does have neuropathy. He does have a discolored region of the dorsal surface of the left foot which was the site where the ulcer was that we previously treated. The good news is he does not again have any discomfort although obviously I do believe this may be some scar tissue and just discoloration in that regard. Again he hasn't had any drainage or discharge since I last saw him and at this point I see no evidence of that either this area shows complete epithelialization. In general I do not feel like there's any significant issue at this point in that regard. READMISSION 02/09/2019 This is a 73 year old man with type 2 diabetes poorly controlled with significant peripheral neuropathy. He has been in this clinic before in 2018 with a laceration on his right hand and then again in 2019 with an area over the right lateral malleolus. 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 He states that the wound is been there for the last week or 2. It is been uncomfortable to walk on. The man is an active man having to look after an elderly  mother. Past medical history, type 2 diabetes with polyneuropathy with a recent hemoglobin A1c in September 2011 0.3, lumbar disc disease, hypergonadism., Left foot drop and gout. We did not repeat his ABIs in the clinic quoting values from July 2019 of 0.95 and 1.01. He is not felt to have significant PAD 02/19/2019 on evaluation today patient actually appears to be doing better with regard to his foot wound based on what I am seeing currently. He was seen by Dr. Dellia Nims on readmission back into the clinic last week when I was off. The good news is the patient has been tolerating the dressing changes without any complication in fact has not really had much in the way of drainage. He does have an AFO brace which she believes is actually pushing on this area as well and has contributed to the issue. Fortunately there is no signs of active infection at this point. His ABIs appear to be well. 03/05/2019 on evaluation today patient appears to be doing well in regard to his foot ulcer. He has been tolerating the dressing changes without complication. Fortunately there is no signs of active infection. The wound is measuring somewhat smaller and overall I feel like he is making good progress at this point. Electronic Signature(s) Signed: 03/05/2019 1:12:57 PM By: Worthy Keeler [name] Entered By: Worthy Keeler on 03/05/2019 13:12:56 [name] ([id number]) --------------------------------------------------------------------------------  Physical Exam Details Patient Name: [name], [name] Date of Service: 03/05/2019 12:45 PM Medical Record Number: [id number] Patient Account Number: [id number] Date of Birth/Sex: [birth year] (73 y.o. M) Treating RN: Harold Barban Primary Care Provider: [name] Other Clinician: Referring Provider: [name] Treating Provider/Extender: [name], HOYT Weeks in Treatment: 3 Constitutional Well-nourished and well-hydrated in no acute  distress. Respiratory normal breathing without difficulty. Psychiatric this patient is able to make decisions and demonstrates good insight into disease process. Alert and Oriented x 3. pleasant and cooperative. Notes Patient's wound bed currently showed signs of fairly good granulation though there was some slough buildup on the surface of the wound that did require sharp debridement today. Subsequently I was able to perform debridement at this point without complication he had no pain I remove callus as well as slough from the surface of the wound down to good subcutaneous tissue all of which she tolerated an excellent fashion. Post debridement the wound bed appears to be doing much better. Electronic Signature(s) Signed: 03/05/2019 1:13:37 PM By: Worthy Keeler [name] Entered By: Worthy Keeler on 03/05/2019 13:13:37 [name] ([id number]) -------------------------------------------------------------------------------- Physician Orders Details Patient Name: [name] Date of Service: 03/05/2019 12:45 PM Medical Record Number: [id number] Patient Account Number: [id number] Date of Birth/Sex: September 21, 1945 (73 y.o. M) Treating RN: Harold Barban Primary Care Provider: [name] Other Clinician: Referring Provider: [name] Treating Provider/Extender: Melburn Hake, HOYT Weeks in Treatment: 3 Verbal / Phone Orders: No Diagnosis Coding ICD-10 Coding Code Description E11.621 Type 2 diabetes mellitus with foot ulcer E11.42 Type 2 diabetes mellitus with diabetic polyneuropathy L97.522 Non-pressure chronic ulcer of other part of left foot with fat layer exposed M21.372 Foot drop, left foot Wound Cleansing Wound #5 Left,Lateral Foot o Clean wound with Normal Saline. o Cleanse wound with mild soap and water o May Shower, gently pat wound dry prior to applying new dressing. Primary Wound Dressing Wound #5 Left,Lateral Foot o Silver Collagen Secondary  Dressing Wound #5 Left,Lateral Foot o Conform/Kerlix - Rolled gauze o Foam - To pad area Dressing Change Frequency Wound #5 Left,Lateral Foot o Change dressing every other day. Follow-up Appointments Wound #5 Left,Lateral Foot o Return Appointment in 1 week. Off-Loading Wound #5 Left,Lateral Foot o Open toe surgical shoe with peg assist. - left foot Electronic Signature(s) Unsigned Entered By: Harold Barban on 03/05/2019 13:09:35 Signature(s): Date(s): ZAYDENN, BALAGUER ([id number]) Wynetta Emery, Grier Rocher ([id number]) -------------------------------------------------------------------------------- Problem List Details Patient Name: [name] Date of Service: 03/05/2019 12:45 PM Medical Record Number: [id number] Patient Account Number: [id number] Date of Birth/Sex: 06-26-1945 (73 y.o. M) Treating RN: Harold Barban Primary Care Provider: [name] Other Clinician: Referring Provider: [name] Treating Provider/Extender: Melburn Hake, HOYT Weeks in Treatment: 3 Active Problems ICD-10 Evaluated Encounter Code Description Active Date Today Diagnosis E11.621 Type 2 diabetes mellitus with foot ulcer 02/09/2019 No Yes E11.42 Type 2 diabetes mellitus with diabetic polyneuropathy 02/09/2019 No Yes L97.522 Non-pressure chronic ulcer of other part of left foot with fat 02/09/2019 No Yes layer exposed M21.372 Foot drop, left foot 02/09/2019 No Yes Inactive Problems Resolved Problems Electronic Signature(s) Signed: 03/05/2019 1:00:59 PM By: Worthy Keeler [name] Entered By: Worthy Keeler on 03/05/2019 13:00:58 [name] ([id number]) -------------------------------------------------------------------------------- Progress Note Details Patient Name: [name] Date of Service: 03/05/2019 12:45 PM Medical Record Number: [id number] Patient Account Number: [id number] Date of Birth/Sex: [birth year] (73 y.o. M) Treating RN: Harold Barban Primary  Care Provider: [name] Other Clinician: Referring Provider: Lorelei Pont,  SPENCER Treating Provider/Extender: Melburn Hake, HOYT Weeks in Treatment: 3 Subjective Chief Complaint Information obtained from Patient Left dorsal foot ulcer 02/09/2019; patient is here for review of the wound on the left lateral foot roughly at the level of the base of the fifth metatarsal History of Present Illness (HPI) 10/04/16 on evaluation today patient presents for initial evaluation concerning a laceration of the right wrist and hand on the bowler aspect. He tells me that he fell into glass following a medication change in regard to his diabetes. He initially went to [facility] ER where she was sutured and subsequently his daughter who is a Marine scientist removed the teachers at the appropriate time. Unfortunately the wound has done poorly and dehissed which has caused him some trouble including infection. He has been on antibiotics both topically and orally though the wound continues to show signs of necrotic tissue according to notes which is what he was sent to [place] for evaluation concerning. He does have diabetes and unfortunately this is severely uncontrolled. He does see an endocrinologist and is working on this. 10/11/2016 -- the patient's notes from the ER were reviewed and he had his suturing done on 09/17/2016 and had necrotic skin flaps which had to be trimmed the last visit he was here. He has managed to get his Santyl ointment and has been using this appropriately. 10/21/16 patient's wound appears to be doing very well on evaluation today. He still has some Slough covering the wound bed but this is nowhere near as severe as when i saw this initially two weeks ago nor even my review of his pictures last week. I'm pleased with how this is progressing. Patient's wound over the right form appears to be doing fairly well on evaluation today. There is no evidence of infection and he has a small area at the crease  of where she is wrist extends and flexes that is still open and appears to be having difficulty due to the fact that it is cracking open. I think this is something that may be controlled with moisture control that is keeping the wound a little bit more moist. Nonetheless this is a tiny region compared to the initial wound and overall appears to be doing very well. No fevers, chills, nausea, or vomiting noted at this time. 11/25/16 on evaluation today patient appears to be doing well in regard to his right wrist ulcer. In fact this appears to be completely healed he is having no discomfort. He is pleased with how this is progressed. Readmission: 06/24/17 on evaluation today patient appears back in our clinic regarding issues that he has been having with his right ankle on the lateral malleolus as well is in the dorsal surface of his left foot. Both have been going on for several weeks unfortunately. He tells me that initially this was being managed by Dr. Edilia Bo and I did review the notes from Dr. Edilia Bo in epic. Patient was placed on amoxicillin initially and then subsequently Keflex following. He has been using triple antibiotic ointment over-the-counter along with a Band-Aid for the wound currently. He did not have any Santyl remaining. His most recent hemoglobin A1c which was just two days ago was 10.3. Fortunately he seems to have excellent blood flow in the bilateral lower extremities with his left ABI being 0.95 in the right ABI 1.01. Patient does have some pain in regard to each of these ulcerations but he tells me it's not nearly as severe as the hand wound that I  help treat earlier over the summer. This was in 2018. 07/15/17 on evaluation today patient's lower surety ulcer is actually both appear to be doing fairly well at this point. He does have a little bit of skin overlapping the ulcer on the right lateral ankle this was debrided away today just with saline and gauze without  complication patient had no discomfort or pain. Otherwise patient's left foot ulcer does appear to be showing signs of EGIDIO, LOFGREN. ([id number]) improvement he does seem to have a right great toe. Nikki and noted at this point in time. I think that this does need to be managed at this point. No fevers, chills, nausea, or vomiting noted at this time. 07/22/17 on evaluation today patient's wounds in regard to his lower extremities appear to be doing somewhat better. His right ankle wound in fact appears to likely be completely closed. The left dorsal foot ulcer though still open does appear to be a little bit smaller and does also seem to be making good progress. He has not been having problems with the treatment at this point in the general he does not seem to show as much erythema surrounding the wound bed which is good news. He is not having significant discomfort. 08/12/17 on evaluation today patients wounds of both appeared to be doing better in fact they both appear to be completely healed. Overall I'm very pleased with how things have progressed over the past week even more than what I expected. Obviously this is good news and he is happy about this. He did have a fall when going to his orthopedic doctor recently fortunately he does not appear to have injured anything severely but nonetheless does have a couple of bumps and bruises nothing significant at this time but he needs to be seen and wound care for. He may have broken his left wrist he is following up with orthopedics in that regard. Readmission: 10/24/17 on evaluation today patient presents for reevaluation after having been discharged Aug 12, 2017. His wound was healed at that point although he states that it has been somewhat discolored since that time. He has not experienced the drainage as far as the wound is concerned but the fact that it was still discolored been greater than two months after the fact made him want to come in  for reevaluation. Upon inspection today the patient has no pain although he does have neuropathy. He does have a discolored region of the dorsal surface of the left foot which was the site where the ulcer was that we previously treated. The good news is he does not again have any discomfort although obviously I do believe this may be some scar tissue and just discoloration in that regard. Again he hasn't had any drainage or discharge since I last saw him and at this point I see no evidence of that either this area shows complete epithelialization. In general I do not feel like there's any significant issue at this point in that regard. READMISSION 02/09/2019 This is a 73 year old man with type 2 diabetes poorly controlled with significant peripheral neuropathy. He has been in this clinic before in 2018 with a laceration on his right hand and then again in 2019 with an area over the right lateral malleolus. 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 He states that the wound is been there for the last week or 2.  It is been uncomfortable to walk on. The man is an active man having to look after an elderly mother. Past medical history, type 2 diabetes with polyneuropathy with a recent hemoglobin A1c in September 2011 0.3, lumbar disc disease, hypergonadism., Left foot drop and gout. We did not repeat his ABIs in the clinic quoting values from July 2019 of 0.95 and 1.01. He is not felt to have significant PAD 02/19/2019 on evaluation today patient actually appears to be doing better with regard to his foot wound based on what I am seeing currently. He was seen by Dr. Dellia Nims on readmission back into the clinic last week when I was off. The good news is the patient has been tolerating the dressing changes without any complication in fact has not really had much in the way of drainage. He does have an AFO brace which she  believes is actually pushing on this area as well and has contributed to the issue. Fortunately there is no signs of active infection at this point. His ABIs appear to be well. 03/05/2019 on evaluation today patient appears to be doing well in regard to his foot ulcer. He has been tolerating the dressing changes without complication. Fortunately there is no signs of active infection. The wound is measuring somewhat smaller and overall I feel like he is making good progress at this point. Patient History Information obtained from Patient. Family History Cancer - Siblings, Diabetes - Father, Hypertension - Father, Kidney Disease - Mother, Stroke - Father, No family history of Heart Disease, Lung Disease, Thyroid Problems, Tuberculosis. [name], [name] ([id number]) Social History Former smoker, Marital Status - Widowed, Alcohol Use - Never, Drug Use - No History, Caffeine Use - Moderate. Medical History Eyes Patient has history of Cataracts - 2018 Ear/Nose/Mouth/Throat Patient has history of Middle ear problems Denies history of Chronic sinus problems/congestion Hematologic/Lymphatic Denies history of Anemia, Hemophilia, Human Immunodeficiency Virus, Lymphedema, Sickle Cell Disease Respiratory Patient has history of Sleep Apnea Denies history of Aspiration, Asthma, Chronic Obstructive Pulmonary Disease (COPD), Pneumothorax, Tuberculosis Cardiovascular Patient has history of Hypertension Denies history of Angina, Arrhythmia, Congestive Heart Failure, Coronary Artery Disease, Deep Vein Thrombosis, Hypotension, Myocardial Infarction, Peripheral Arterial Disease, Peripheral Venous Disease, Phlebitis, Vasculitis Gastrointestinal Denies history of Cirrhosis , Colitis, Crohn s, Hepatitis A, Hepatitis B, Hepatitis C Endocrine Patient has history of Type II Diabetes Genitourinary Denies history of End Stage Renal Disease Immunological Denies history of Lupus Erythematosus, Raynaud s,  Scleroderma Musculoskeletal Patient has history of Gout - history, Osteoarthritis Denies history of Rheumatoid Arthritis, Osteomyelitis Neurologic Patient has history of Neuropathy Denies history of Dementia, Quadriplegia, Paraplegia, Seizure Disorder Oncologic Denies history of Received Chemotherapy, Received Radiation Medical And Surgical History Notes Cardiovascular CVA in 2015 Genitourinary "weak kidneys" - is a patient at [facility] Constitutional Symptoms (General Health) Denies complaints or symptoms of Fatigue, Fever, Chills, Marked Weight Change. Respiratory Denies complaints or symptoms of Chronic or frequent coughs, Shortness of Breath. Cardiovascular Denies complaints or symptoms of Chest pain, LE edema. Psychiatric Denies complaints or symptoms of Anxiety, Claustrophobia. [name], [name] ([id number]) Objective Constitutional Well-nourished and well-hydrated in no acute distress. Vitals Time Taken: 12:51 PM, Height: 71 in, Weight: 205 lbs, BMI: 28.6, Temperature: 98.7 F, Pulse: 83 bpm, Respiratory Rate: 18 breaths/min, Blood Pressure: 141/76 mmHg. Respiratory normal breathing without difficulty. Psychiatric this patient is able to make decisions and demonstrates good insight into disease process. Alert and Oriented x 3. pleasant and cooperative. General Notes: Patient's wound  bed currently showed signs of fairly good granulation though there was some slough buildup on the surface of the wound that did require sharp debridement today. Subsequently I was able to perform debridement at this point without complication he had no pain I remove callus as well as slough from the surface of the wound down to good subcutaneous tissue all of which she tolerated an excellent fashion. Post debridement the wound bed appears to be doing much better. Integumentary (Hair, Skin) Wound #5 status is Open. Original cause of wound was Pressure Injury. The  wound is located on the Left,Lateral Foot. The wound measures 0.5cm length x 0.7cm width x 0.1cm depth; 0.275cm^2 area and 0.027cm^3 volume. There is Fat Layer (Subcutaneous Tissue) Exposed exposed. There is no tunneling or undermining noted. There is a medium amount of serous drainage noted. The wound margin is thickened. There is small (1-33%) pale granulation within the wound bed. There is a large (67-100%) amount of necrotic tissue within the wound bed including Adherent Slough. 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 There was a Excisional Skin/Subcutaneous Tissue Debridement with a total area of 0.35 sq cm performed by [name], HOYT E., [name]. With the following instrument(s): Curette to remove Viable and Non-Viable tissue/material. Material removed includes Callus, Subcutaneous Tissue, and Slough after achieving pain control using Lidocaine. No specimens were taken. A time out was conducted at 13:05, prior to the start of the procedure. There was no bleeding. The procedure was tolerated well with a pain level of 0 throughout and a [name], Dustin E. ([id number]) pain level of 0 following the procedure. Post Debridement Measurements: 0.5cm length x 0.7cm width x 0.1cm depth; 0.027cm^3 volume. Character of Wound/Ulcer Post Debridement is improved. Severity of Tissue Post Debridement is: Fat layer exposed. Post procedure Diagnosis Wound #5: Same as Pre-Procedure Plan Wound Cleansing: Wound #5 Left,Lateral Foot: Clean wound with Normal Saline. Cleanse wound with mild soap and water May Shower, gently pat  wound dry prior to applying new dressing. Primary Wound Dressing: Wound #5 Left,Lateral Foot: Silver Collagen Secondary Dressing: Wound #5 Left,Lateral Foot: Conform/Kerlix - Rolled gauze Foam - To pad area Dressing Change Frequency: Wound #5 Left,Lateral Foot: Change dressing every other day. Follow-up Appointments: Wound #5 Left,Lateral Foot: Return Appointment in 1 week. Off-Loading: Wound #5 Left,Lateral Foot: Open toe surgical shoe with peg assist. - left foot 1. My suggestion currently is good to be that we go ahead and continue with the current wound care measures for the next week and the patient is in agreement with that plan. This includes the use of the silver collagen dressing which seems to be doing excellent for him. 2. I am getting suggest as well that we continue to cover the area with rolled gauze and foam to pad the area to prevent as much pressure as possible. 3. I am also can recommend that we continue with the open toe surgical shoe with Pegasys that I think is helpful for him as well. We will see patient back for reevaluation in 1 week here in the clinic. If anything worsens or changes patient will contact our office for additional recommendations. Electronic Signature(s) Signed: 03/05/2019  1:14:11 PM By: Worthy Keeler [name] Entered By: Worthy Keeler on 03/05/2019 13:14:11 [name] ([id number]) -------------------------------------------------------------------------------- ROS/PFSH Details Patient Name: [name] Date of Service: 03/05/2019 12:45 PM Medical Record Number: [id number] Patient Account Number: [id number] Date of Birth/Sex: Jun 25, 1945 (73 y.o. M) Treating RN: Harold Barban Primary Care Provider: [name] Other Clinician: Referring Provider: [name] Treating Provider/Extender: Melburn Hake, HOYT Weeks in Treatment: 3 Information Obtained From Patient Constitutional Symptoms (General Health) Complaints and  Symptoms: Negative for: Fatigue; Fever; Chills; Marked Weight Change Respiratory Complaints and Symptoms: Negative for: Chronic or frequent coughs; Shortness of Breath Medical History: Positive for: Sleep Apnea Negative for: Aspiration; Asthma; Chronic Obstructive Pulmonary Disease (COPD); Pneumothorax; Tuberculosis Cardiovascular Complaints and Symptoms: Negative for: Chest pain; LE edema Medical History: Positive for: Hypertension Negative for: Angina; Arrhythmia; Congestive Heart Failure; Coronary Artery Disease; Deep Vein Thrombosis; Hypotension; Myocardial Infarction; Peripheral Arterial Disease; Peripheral Venous Disease; Phlebitis; Vasculitis Past Medical History Notes: CVA in 2015 Psychiatric Complaints and Symptoms: Negative for: Anxiety; Claustrophobia Eyes Medical History: Positive for: Cataracts - 2018 Ear/Nose/Mouth/Throat Medical History: Positive for: Middle ear problems Negative for: Chronic sinus problems/congestion Hematologic/Lymphatic Medical History: Negative for: Anemia; Hemophilia; Human Immunodeficiency Virus; Lymphedema; Sickle Cell Disease DMANI, MIZER ([id number]) Gastrointestinal Medical History: Negative for: Cirrhosis ; Colitis; Crohnos; Hepatitis A; Hepatitis B; Hepatitis C Endocrine Medical History: Positive for: Type II Diabetes Time with diabetes: 20 yrs Treated with: Insulin Blood sugar tested every day: Yes Tested : TID Blood sugar testing results: Bedtime: 130 Genitourinary Medical History: Negative for: End Stage Renal Disease Past Medical History Notes: "weak kidneys" - is a patient at [facility] History: Negative for: Lupus Erythematosus; Raynaudos; Scleroderma Musculoskeletal Medical History: Positive for: Gout - history; Osteoarthritis Negative for: Rheumatoid Arthritis; Osteomyelitis Neurologic Medical History: Positive for: Neuropathy Negative for: Dementia; Quadriplegia; Paraplegia;  Seizure Disorder Oncologic Medical History: Negative for: Received Chemotherapy; Received Radiation HBO Extended History Items Eyes: Ear/Nose/Mouth/Throat: Cataracts Middle ear problems Immunizations Pneumococcal Vaccine: Received Pneumococcal Vaccination: Yes Immunization Notes: up to date Implantable Devices None Family and Social History [name], [name] ([id number]) Cancer: Yes - Siblings; Diabetes: Yes - Father; Heart Disease: No; Hypertension: Yes - Father; Kidney Disease: Yes - Mother; Lung Disease: No; Stroke: Yes - Father; Thyroid Problems: No; Tuberculosis: No; Former smoker; Marital Status - Widowed; Alcohol Use: Never; Drug Use: No History; Caffeine Use: Moderate; Financial Concerns: No; Food, Clothing or Shelter Needs: No; Support System Lacking: No; Transportation Concerns: No Physician Affirmation I have reviewed and agree with the above information. Electronic Signature(s) Unsigned Entered By: Worthy Keeler on 03/05/2019 13:13:12 Signature(s): Date(s): [name], [name] ([id number]) -------------------------------------------------------------------------------- SuperBill Details Patient Name: [name] Date of Service: 03/05/2019 Medical Record Number: [id number] Patient Account Number: [id number] Date of Birth/Sex: Nov 24, 1945 (74 y.o. M) Treating RN: Harold Barban Primary Care Provider: [name] Other Clinician: Referring Provider: [name] Treating Provider/Extender: Melburn Hake, HOYT Weeks in Treatment: 3 Diagnosis Coding ICD-10 Codes Code Description E11.621 Type 2 diabetes mellitus with foot ulcer E11.42 Type 2 diabetes mellitus with diabetic polyneuropathy L97.522 Non-pressure chronic ulcer of other part of left foot with fat layer exposed M21.372 Foot drop, left foot Facility Procedures CPT4 Code: 67619509 Description: 32671 - DEB SUBQ TISSUE 20 SQ CM/< ICD-10 Diagnosis Description L97.522 Non-pressure chronic ulcer of  other part of left foot with fat Modifier: layer exposed Quantity: 1 Physician Procedures CPT4 Code: 2458099 Description: 11042 - WC PHYS SUBQ TISS 20 SQ CM ICD-10 Diagnosis Description L97.522  Non-pressure chronic ulcer of other part of left foot with fat Modifier: layer exposed Quantity: 1 Electronic Signature(s) Signed: 03/05/2019 1:14:22 PM By: Worthy Keeler [name] Entered By: Worthy Keeler on 03/05/2019 13:14:21

## 2019-03-07 ENCOUNTER — Other Ambulatory Visit: Payer: Self-pay | Admitting: *Deleted

## 2019-03-07 ENCOUNTER — Ambulatory Visit (INDEPENDENT_AMBULATORY_CARE_PROVIDER_SITE_OTHER): Payer: Medicare Other | Admitting: *Deleted

## 2019-03-07 DIAGNOSIS — E291 Testicular hypofunction: Secondary | ICD-10-CM

## 2019-03-07 MED ORDER — TESTOSTERONE CYPIONATE 200 MG/ML IM SOLN: 150.0000 mg | Freq: Once | INTRAMUSCULAR | 1 refills | Status: DC

## 2019-03-07 MED ORDER — TESTOSTERONE CYPIONATE 200 MG/ML IM SOLN
150.0000 mg | Freq: Once | INTRAMUSCULAR | Status: AC
  Administered 2019-03-07: 150 mg via INTRAMUSCULAR

## 2019-03-07 NOTE — Progress Notes (Signed)
Per orders of Dr. Lorelei Pont, injection of Testosterone given by Lauralyn Primes. Patient tolerated injection well.

## 2019-03-07 NOTE — Telephone Encounter (Signed)
Patient states he normally gets 6-75ml vials at a time.  This time he only go 2 vials.  Can you please resend Rx to pharmacy for 6 ml.

## 2019-03-12 ENCOUNTER — Encounter: Payer: Medicare Other | Admitting: Physician Assistant

## 2019-03-12 ENCOUNTER — Other Ambulatory Visit: Payer: Self-pay

## 2019-03-12 DIAGNOSIS — Z87891 Personal history of nicotine dependence: Secondary | ICD-10-CM | POA: Diagnosis not present

## 2019-03-12 DIAGNOSIS — I10 Essential (primary) hypertension: Secondary | ICD-10-CM | POA: Diagnosis not present

## 2019-03-12 DIAGNOSIS — L97522 Non-pressure chronic ulcer of other part of left foot with fat layer exposed: Secondary | ICD-10-CM | POA: Diagnosis not present

## 2019-03-12 DIAGNOSIS — E1142 Type 2 diabetes mellitus with diabetic polyneuropathy: Secondary | ICD-10-CM | POA: Diagnosis not present

## 2019-03-12 DIAGNOSIS — M21372 Foot drop, left foot: Secondary | ICD-10-CM | POA: Diagnosis not present

## 2019-03-12 DIAGNOSIS — E11621 Type 2 diabetes mellitus with foot ulcer: Secondary | ICD-10-CM | POA: Diagnosis not present

## 2019-03-12 MED ORDER — TESTOSTERONE CYPIONATE 200 MG/ML IM SOLN: 150.0000 mg | INTRAMUSCULAR | 1 refills | Status: DC

## 2019-03-12 NOTE — Telephone Encounter (Signed)
Received fax from [facility] stating to resend refill with dosing frequency.  Please sign corrected refill.

## 2019-03-12 NOTE — Addendum Note (Signed)
Addended by: Carter Kitten on: 03/12/2019 11:39 AM   Modules accepted: Orders

## 2019-03-14 DIAGNOSIS — L309 Dermatitis, unspecified: Secondary | ICD-10-CM | POA: Diagnosis not present

## 2019-03-14 DIAGNOSIS — Z23 Encounter for immunization: Secondary | ICD-10-CM | POA: Diagnosis not present

## 2019-03-14 DIAGNOSIS — L700 Acne vulgaris: Secondary | ICD-10-CM | POA: Diagnosis not present

## 2019-03-14 NOTE — Progress Notes (Signed)
[name], [name] ([id number]) Visit Report for 03/12/2019 Arrival Information Details Patient Name: [name], [name] Date of Service: 03/12/2019 12:30 PM Medical Record Number: [id number] Patient Account Number: [id number] Date of Birth/Sex: Mar 17, 1946 (73 y.o. M) Treating RN: Army Melia Primary Care Daenerys Buttram: [name] Other Clinician: Referring Shellee Streng: [name] Treating Anchor Dwan/Extender: Melburn Hake, HOYT Weeks in Treatment: 4 Visit Information History Since Last Visit Added or deleted any medications: No Patient Arrived: Ambulatory Any new allergies or adverse reactions: No Arrival Time: 12:37 Had a fall or experienced change in No Accompanied By: self activities of daily living that may affect Transfer Assistance: None risk of falls: Patient Identification Verified: Yes Signs or symptoms of abuse/neglect since last visito No Patient Has Alerts: Yes Hospitalized since last visit: No Patient Alerts: DMII Has Dressing in Place as Prescribed: Yes Pain Present Now: No Electronic Signature(s) Signed: 03/12/2019 12:57:44 PM By: Army Melia Entered By: Army Melia on 03/12/2019 12:37:52 [name] ([id number]) -------------------------------------------------------------------------------- Encounter Discharge Information Details Patient Name: [name] Date of Service: 03/12/2019 12:30 PM Medical Record Number: [id number] Patient Account Number: [id number] Date of Birth/Sex: 10-17-1945 (73 y.o. M) Treating RN: Harold Barban Primary Care Alysha Doolan: [name] Other Clinician: Referring Janna Oak: [name] Treating Azarria Balint/Extender: Melburn Hake, HOYT Weeks in Treatment: 4 Encounter Discharge Information Items Post Procedure Vitals Discharge Condition: Stable Temperature (F): 97.8 Ambulatory Status: Cane Pulse (bpm): 85 Discharge Destination: Home Respiratory Rate (breaths/min): 16 Transportation: Private Auto Blood Pressure  (mmHg): 162/72 Accompanied By: self Schedule Follow-up Appointment: Yes Clinical Summary of Care: Electronic Signature(s) Signed: 03/14/2019 4:10:47 PM By: Harold Barban Entered By: Harold Barban on 03/12/2019 13:00:06 [name] ([id number]) -------------------------------------------------------------------------------- Lower Extremity Assessment Details Patient Name: [name] Date of Service: 03/12/2019 12:30 PM Medical Record Number: [id number] Patient Account Number: [id number] Date of Birth/Sex: 1945-09-25 (73 y.o. M) Treating RN: Army Melia Primary Care Macai Sisneros: [name] Other Clinician: Referring Dera Vanaken: [name] Treating Marius Betts/Extender: [name], HOYT Weeks in Treatment: 4 Edema Assessment Assessed: [Left: No] [Right: No] Edema: [Left: N] [Right: o] Vascular Assessment Pulses: Dorsalis Pedis Palpable: [Left:Yes] Electronic Signature(s) Signed: 03/12/2019 12:57:44 PM By: Army Melia Entered By: Army Melia on 03/12/2019 12:41:51 [name] ([id number]) -------------------------------------------------------------------------------- Multi Wound Chart Details Patient Name: [name] Date of Service: 03/12/2019 12:30 PM Medical Record Number: [id number] Patient Account Number: [id number] Date of Birth/Sex: 1945/12/27 (73 y.o. M) Treating RN: Harold Barban Primary Care Jourdyn Hasler: [name] Other Clinician: Referring Nai Dasch: [name] Treating Liyat Faulkenberry/Extender: Melburn Hake, HOYT Weeks in Treatment: 4 Vital Signs Height(in): 71 Pulse(bpm): 85 Weight(lbs): 205 Blood Pressure(mmHg): 162/72 Body Mass Index(BMI): 29 Temperature(F): 98.7 Respiratory Rate 16 (breaths/min): Photos: [N/A:N/A] Wound Location: Left Foot - Lateral N/A N/A Wounding Event: Pressure Injury N/A N/A Primary Etiology: Diabetic Wound/Ulcer of the N/A N/A Lower Extremity Comorbid History: Cataracts, Middle ear N/A  N/A problems, Sleep Apnea, Hypertension, Type II Diabetes, Gout, Osteoarthritis, Neuropathy Date Acquired: 01/19/2019 N/A N/A Weeks of Treatment: 4 N/A N/A Wound Status: Open N/A N/A Measurements L x W x D 0.6x0.7x0.2 N/A N/A (cm) Area (cm) : 0.33 N/A N/A Volume (cm) : 0.066 N/A N/A % Reduction in Area: -20.00% N/A N/A % Reduction in Volume: 19.50% N/A N/A Classification: Grade 2 N/A N/A Exudate Amount: Medium N/A N/A Exudate Type: Serous N/A N/A Exudate Color: amber N/A N/A Wound Margin: Thickened N/A N/A Granulation Amount: Medium (34-66%) N/A N/A Granulation Quality: Pale N/A N/A Necrotic Amount: Medium (34-66%) N/A N/A Exposed Structures: Fat Layer (Subcutaneous N/A  N/A Tissue) Exposed: Yes Fascia: No [name], [name]. ([id number]) Tendon: No Muscle: No Joint: No Bone: No Epithelialization: None N/A N/A Treatment Notes Electronic Signature(s) Signed: 03/14/2019 4:10:47 PM By: Harold Barban Entered By: Harold Barban on 03/12/2019 12:53:11 [name] ([id number]) -------------------------------------------------------------------------------- [place] Details Patient Name: [name] Date of Service: 03/12/2019 12:30 PM Medical Record Number: [id number] Patient Account Number: [id number] Date of Birth/Sex: 1946-03-29 (73 y.o. M) Treating RN: Harold Barban Primary Care Smera Guyette: [name] Other Clinician: Referring Jariana Shumard: [name] Treating December Hedtke/Extender: Melburn Hake, HOYT Weeks in Treatment: 4 Active Inactive Electronic Signature(s) Signed: 03/14/2019 4:10:47 PM By: Harold Barban Entered By: Harold Barban on 03/12/2019 12:53:03 [name] ([id number]) -------------------------------------------------------------------------------- Pain Assessment Details Patient Name: [name] Date of Service: 03/12/2019 12:30 PM Medical Record Number: [id number] Patient Account Number: [id number] Date  of Birth/Sex: 1945-08-07 (73 y.o. M) Treating RN: Army Melia Primary Care Mak Bonny: [name] Other Clinician: Referring Zendaya Groseclose: [name] Treating Mustapha Colson/Extender: Melburn Hake, HOYT Weeks in Treatment: 4 Active Problems Location of Pain Severity and Description of Pain Patient Has Paino No Site Locations Pain Management and Medication Current Pain Management: Electronic Signature(s) Signed: 03/12/2019 12:57:44 PM By: Army Melia Entered By: Army Melia on 03/12/2019 12:38:01 [name] ([id number]) -------------------------------------------------------------------------------- Patient/Caregiver Education Details Patient Name: [name] Date of Service: 03/12/2019 12:30 PM Medical Record Number: [id number] Patient Account Number: [id number] Date of Birth/Gender: 05/18/1945 (73 y.o. M) Treating RN: Harold Barban Primary Care Physician: [name] Other Clinician: Referring Physician: [name] Treating Physician/Extender: Sharalyn Ink in Treatment: 4 Education Assessment Education Provided To: Patient Education Topics Provided Pressure: Handouts: Pressure Ulcers: Care and Offloading Methods: Demonstration, Explain/Verbal Responses: State content correctly Wound/Skin Impairment: Handouts: Caring for Your Ulcer Methods: Demonstration, Explain/Verbal Responses: State content correctly Electronic Signature(s) Signed: 03/14/2019 4:10:47 PM By: Harold Barban Entered By: Harold Barban on 03/12/2019 12:53:49 [name] ([id number]) -------------------------------------------------------------------------------- Wound Assessment Details Patient Name: [name] Date of Service: 03/12/2019 12:30 PM Medical Record Number: [id number] Patient Account Number: [id number] Date of Birth/Sex: 1945-06-23 (73 y.o. M) Treating RN: Army Melia Primary Care Dent Plantz: [name] Other Clinician: Referring  Deloyce Walthers: [name] Treating Namiah Dunnavant/Extender: [name], HOYT Weeks in Treatment: 4 Wound Status Wound Number: 5 Primary Diabetic Wound/Ulcer of the Lower Extremity Etiology: Wound Location: Left Foot - Lateral Wound Open Wounding Event: Pressure Injury Status: Date Acquired: 01/19/2019 Comorbid Cataracts, Middle ear problems, Sleep Apnea, Weeks Of Treatment: 4 History: Hypertension, Type II Diabetes, Gout, Clustered Wound: No Osteoarthritis, Neuropathy Photos Wound Measurements Length: (cm) 0.6 % Reduction i Width: (cm) 0.7 % Reduction i Depth: (cm) 0.2 Epithelializa Area: (cm) 0.33 Tunneling: Volume: (cm) 0.066 Undermining: n Area: -20% n Volume: 19.5% tion: None No No Wound Description Classification: Grade 2 Foul Odor Aft Wound Margin: Thickened Slough/Fibrin Exudate Amount: Medium Exudate Type: Serous Exudate Color: amber er Cleansing: No o Yes Wound Bed Granulation Amount: Medium (34-66%) Exposed Structure Granulation Quality: Pale Fascia Exposed: No Necrotic Amount: Medium (34-66%) Fat Layer (Subcutaneous Tissue) Exposed: Yes Necrotic Quality: Adherent Slough Tendon Exposed: No Muscle Exposed: No Joint Exposed: No Bone Exposed: No Treatment Notes [name], [name] ([id number]) Wound #5 (Left, Lateral Foot) Notes Silver collagen, Foam to apd, rolled gauze with peg assist shoe Electronic Signature(s) Signed: 03/12/2019 12:57:44 PM By: Army Melia Entered By: Army Melia on 03/12/2019 12:41:33 [name] ([id number]) -------------------------------------------------------------------------------- Vitals Details Patient Name: [name] Date of Service: 03/12/2019 12:30 PM Medical Record Number: [id number]  Patient Account Number: [id number] Date of Birth/Sex: 19-Apr-1945 (73 y.o. M) Treating RN: Army Melia Primary Care Merdith Boyd: [name] Other Clinician: Referring Phuong Hillary: [name] Treating Jizelle Conkey/Extender:  Melburn Hake, HOYT Weeks in Treatment: 4 Vital Signs Time Taken: 12:38 Temperature (F): 98.7 Height (in): 71 Pulse (bpm): 85 Weight (lbs): 205 Respiratory Rate (breaths/min): 16 Body Mass Index (BMI): 28.6 Blood Pressure (mmHg): 162/72 Reference Range: 80 - 120 mg / dl Electronic Signature(s) Signed: 03/12/2019 12:57:44 PM By: Army Melia Entered By: Army Melia on 03/12/2019 12:39:25

## 2019-03-14 NOTE — Progress Notes (Signed)
EDY, BELT ([id number]) Visit Report for 03/12/2019 Chief Complaint Document Details Patient Name: [name], [name] Date of Service: 03/12/2019 12:30 PM Medical Record Number: [id number] Patient Account Number: [id number] Date of Birth/Sex: 1946/02/14 (73 y.o. M) Treating RN: Harold Barban Primary Care Provider: [name] Other Clinician: Referring Provider: [name] Treating Provider/Extender: Melburn Hake, HOYT Weeks in Treatment: 4 Information Obtained from: Patient Chief Complaint Left dorsal foot ulcer 02/09/2019; patient is here for review of the wound on the left lateral foot roughly at the level of the base of the fifth metatarsal Electronic Signature(s) Signed: 03/13/2019 5:47:36 PM By: Worthy Keeler [name] Entered By: Worthy Keeler on 03/12/2019 12:49:38 [name] ([id number]) -------------------------------------------------------------------------------- Debridement Details Patient Name: [name] Date of Service: 03/12/2019 12:30 PM Medical Record Number: [id number] Patient Account Number: [id number] Date of Birth/Sex: 02-Dec-1945 (73 y.o. M) Treating RN: Harold Barban Primary Care Provider: [name] Other Clinician: Referring Provider: [name] Treating Provider/Extender: Melburn Hake, HOYT Weeks in Treatment: 4 Debridement Performed for Wound #5 Left,Lateral Foot Assessment: Performed By: Physician [name], HOYT E., [name] Debridement Type: Debridement Severity of Tissue Pre Fat layer exposed Debridement: Level of Consciousness (Pre- Awake and Alert procedure): Pre-procedure Verification/Time Yes - 12:54 Out Taken: Start Time: 12:54 Pain Control: Lidocaine Total Area Debrided (L x W): 0.6 (cm) x 0.7 (cm) = 0.42 (cm) Tissue and other material Viable, Non-Viable, Callus, Slough, Subcutaneous, Slough debrided: Level: Skin/Subcutaneous Tissue Debridement Description: Excisional Instrument:  Curette Bleeding: None End Time: 12:57 Procedural Pain: 0 Post Procedural Pain: 0 Response to Treatment: Procedure was tolerated well Level of Consciousness Awake and Alert (Post-procedure): Post Debridement Measurements of Total Wound Length: (cm) 0.6 Width: (cm) 0.7 Depth: (cm) 0.2 Volume: (cm) 0.066 Character of Wound/Ulcer Post Debridement: Improved Severity of Tissue Post Debridement: Fat layer exposed Post Procedure Diagnosis Same as Pre-procedure Electronic Signature(s) Signed: 03/13/2019 5:47:36 PM By: Worthy Keeler [name] Signed: 03/14/2019 4:10:47 PM By: Harold Barban Entered By: Harold Barban on 03/12/2019 12:55:56 [name] ([id number]) -------------------------------------------------------------------------------- HPI Details Patient Name: [name] Date of Service: 03/12/2019 12:30 PM Medical Record Number: [id number] Patient Account Number: [id number] Date of Birth/Sex: 1945/06/06 (73 y.o. M) Treating RN: Harold Barban Primary Care Provider: [name] Other Clinician: Referring Provider: [name] Treating Provider/Extender: Melburn Hake, HOYT Weeks in Treatment: 4 History of Present Illness HPI Description: 10/04/16 on evaluation today patient presents for initial evaluation concerning a laceration of the right wrist and hand on the bowler aspect. He tells me that he fell into glass following a medication change in regard to his diabetes. He initially went to [facility] where she was sutured and subsequently his daughter who is a Marine scientist removed the teachers at the appropriate time. Unfortunately the wound has done poorly and dehissed which has caused him some trouble including infection. He has been on antibiotics both topically and orally though the wound continues to show signs of necrotic tissue according to notes which is what he was sent to [place] for evaluation concerning. He does have diabetes and unfortunately this  is severely uncontrolled. He does see an endocrinologist and is working on this. 10/11/2016 -- the patient's notes from the ER were reviewed and he had his suturing done on 09/17/2016 and had necrotic skin flaps which had to be trimmed the last visit he was here. He has managed to get his Santyl ointment and has been using this appropriately. 10/21/16 patient's wound appears to be doing very well on  evaluation today. He still has some Slough covering the wound bed but this is nowhere near as severe as when i saw this initially two weeks ago nor even my review of his pictures last week. I'm pleased with how this is progressing. Patient's wound over the right form appears to be doing fairly well on evaluation today. There is no evidence of infection and he has a small area at the crease of where she is wrist extends and flexes that is still open and appears to be having difficulty due to the fact that it is cracking open. I think this is something that may be controlled with moisture control that is keeping the wound a little bit more moist. Nonetheless this is a tiny region compared to the initial wound and overall appears to be doing very well. No fevers, chills, nausea, or vomiting noted at this time. 11/25/16 on evaluation today patient appears to be doing well in regard to his right wrist ulcer. In fact this appears to be completely healed he is having no discomfort. He is pleased with how this is progressed. Readmission: 06/24/17 on evaluation today patient appears back in our clinic regarding issues that he has been having with his right ankle on the lateral malleolus as well is in the dorsal surface of his left foot. Both have been going on for several weeks unfortunately. He tells me that initially this was being managed by Dr. Edilia Bo and I did review the notes from Dr. Edilia Bo in epic. Patient was placed on amoxicillin initially and then subsequently Keflex following. He has been using  triple antibiotic ointment over-the-counter along with a Band-Aid for the wound currently. He did not have any Santyl remaining. His most recent hemoglobin A1c which was just two days ago was 10.3. Fortunately he seems to have excellent blood flow in the bilateral lower extremities with his left ABI being 0.95 in the right ABI 1.01. Patient does have some pain in regard to each of these ulcerations but he tells me it's not nearly as severe as the hand wound that I help treat earlier over the summer. This was in 2018. 07/15/17 on evaluation today patient's lower surety ulcer is actually both appear to be doing fairly well at this point. He does have a little bit of skin overlapping the ulcer on the right lateral ankle this was debrided away today just with saline and gauze without complication patient had no discomfort or pain. Otherwise patient's left foot ulcer does appear to be showing signs of improvement he does seem to have a right great toe. Nikki and noted at this point in time. I think that this does need to be managed at this point. No fevers, chills, nausea, or vomiting noted at this time. 07/22/17 on evaluation today patient's wounds in regard to his lower extremities appear to be doing somewhat better. His right ankle wound in fact appears to likely be completely closed. The left dorsal foot ulcer though still open does appear to be a little bit smaller and does also seem to be making good progress. He has not been having problems with the treatment at this point in the general he does not seem to show as much erythema surrounding the wound bed which is good news. He is not [name], Dustin E. ([id number]) having significant discomfort. 08/12/17 on evaluation today patients wounds of both appeared to be doing better in fact they both appear to be completely healed. Overall I'm very pleased with how things have  progressed over the past week even more than what I expected. Obviously this is good  news and he is happy about this. He did have a fall when going to his orthopedic doctor recently fortunately he does not appear to have injured anything severely but nonetheless does have a couple of bumps and bruises nothing significant at this time but he needs to be seen and wound care for. He may have broken his left wrist he is following up with orthopedics in that regard. Readmission: 10/24/17 on evaluation today patient presents for reevaluation after having been discharged Aug 12, 2017. His wound was healed at that point although he states that it has been somewhat discolored since that time. He has not experienced the drainage as far as the wound is concerned but the fact that it was still discolored been greater than two months after the fact made him want to come in for reevaluation. Upon inspection today the patient has no pain although he does have neuropathy. He does have a discolored region of the dorsal surface of the left foot which was the site where the ulcer was that we previously treated. The good news is he does not again have any discomfort although obviously I do believe this may be some scar tissue and just discoloration in that regard. Again he hasn't had any drainage or discharge since I last saw him and at this point I see no evidence of that either this area shows complete epithelialization. In general I do not feel like there's any significant issue at this point in that regard. READMISSION 02/09/2019 This is a 73 year old man with type 2 diabetes poorly controlled with significant peripheral neuropathy. He has been in this clinic before in 2018 with a laceration on his right hand and then again in 2019 with an area over the right lateral malleolus. 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 He states that the wound is been there for the last week or 2. It is been uncomfortable to walk on.  The man is an active man having to look after an elderly mother. Past medical history, type 2 diabetes with polyneuropathy with a recent hemoglobin A1c in September 2011 0.3, lumbar disc disease, hypergonadism., Left foot drop and gout. We did not repeat his ABIs in the clinic quoting values from July 2019 of 0.95 and 1.01. He is not felt to have significant PAD 02/19/2019 on evaluation today patient actually appears to be doing better with regard to his foot wound based on what I am seeing currently. He was seen by Dr. Dellia Nims on readmission back into the clinic last week when I was off. The good news is the patient has been tolerating the dressing changes without any complication in fact has not really had much in the way of drainage. He does have an AFO brace which she believes is actually pushing on this area as well and has contributed to the issue. Fortunately there is no signs of active infection at this point. His ABIs appear to be well. 03/05/2019 on evaluation today patient appears to be doing well in regard to his foot ulcer. He has been tolerating the dressing changes without complication. Fortunately there is no signs of active infection. The wound is measuring somewhat smaller and overall I feel like he is making good progress at this point. 03/12/2019 on evaluation today patient actually appears to be doing  quite well with regard to his foot ulcer. The wound is looking better the measurement is not significantly better at this point unfortunately but nonetheless again the overall appearance is improving I think he is making some progress. I still think the big issue here is foot drop and again the patient wants to see if potentially he could get a brace to help with this. Subsequently I think that we can definitely make a referral to [facility] to see if they can help [place] in this regard. He is in agreement with that plan. Electronic Signature(s) Signed: 03/13/2019 5:47:36 PM By: Worthy Keeler [name] Entered By: Worthy Keeler on 03/12/2019 13:00:58 [name] ([id number]) -------------------------------------------------------------------------------- Physical Exam Details Patient Name: [name] Date of Service: 03/12/2019 12:30 PM Medical Record Number: [id number] Patient Account Number: [id number] Date of Birth/Sex: 1945/09/12 (73 y.o. M) Treating RN: Harold Barban Primary Care Provider: [name] Other Clinician: Referring Provider: [name] Treating Provider/Extender: [name], HOYT Weeks in Treatment: 4 Constitutional Well-nourished and well-hydrated in no acute distress. Respiratory normal breathing without difficulty. clear to auscultation bilaterally. Cardiovascular regular rate and rhythm with normal S1, S2. Psychiatric this patient is able to make decisions and demonstrates good insight into disease process. Alert and Oriented x 3. pleasant and cooperative. Notes Patient's wound bed currently showed signs of good granulation at this time. Fortunately there is no evidence of active infection which is also good news. No fevers, chills, nausea, vomiting, or diarrhea. I did have to perform some sharp debridement to clear away necrotic tissue and post debridement the wound bed appears to be doing significantly better which is great news. Electronic Signature(s) Signed: 03/13/2019 5:47:36 PM By: Worthy Keeler [name] Entered By: Worthy Keeler on 03/12/2019 13:01:45 [name] ([id number]) -------------------------------------------------------------------------------- Physician Orders Details Patient Name: [name] Date of Service: 03/12/2019 12:30 PM Medical Record Number: [id number] Patient Account Number: [id number] Date of Birth/Sex: [birth year] (73 y.o. M) Treating RN: Harold Barban Primary Care Provider: [name] Other Clinician: Referring Provider: [name] Treating Provider/Extender:  Melburn Hake, HOYT Weeks in Treatment: 4 Verbal / Phone Orders: No Diagnosis Coding ICD-10 Coding Code Description E11.621 Type 2 diabetes mellitus with foot ulcer E11.42 Type 2 diabetes mellitus with diabetic polyneuropathy L97.522 Non-pressure chronic ulcer of other part of left foot with fat layer exposed M21.372 Foot drop, left foot Wound Cleansing Wound #5 Left,Lateral Foot o Clean wound with Normal Saline. o Cleanse wound with mild soap and water o May Shower, gently pat wound dry prior to applying new dressing. Primary Wound Dressing Wound #5 Left,Lateral Foot o Silver Collagen Secondary Dressing Wound #5 Left,Lateral Foot o Conform/Kerlix - Rolled gauze o Foam - To pad area Dressing Change Frequency Wound #5 Left,Lateral Foot o Change dressing every other day. Follow-up Appointments Wound #5 Left,Lateral Foot o Return Appointment in 2 weeks. Off-Loading Wound #5 Left,Lateral Foot o Open toe surgical shoe with peg assist. - left foot Consults o Podiatry - [facility]-orthotic evaluation due to foot drop causing wound ulcers Electronic Signature(s) Signed: 03/13/2019 5:47:36 PM By: Waynard Reeds, Haston E. ([id number]) Signed: 03/14/2019 4:10:47 PM By: Harold Barban Entered By: Harold Barban on 03/12/2019 12:59:02 [name] ([id number]) -------------------------------------------------------------------------------- Problem List Details Patient Name: [name] Date of Service: 03/12/2019 12:30 PM Medical Record Number: [id number] Patient Account Number: [id number] Date of Birth/Sex: 02/03/1946 (73 y.o. M) Treating RN: Harold Barban Primary Care Provider: [name] Other Clinician: Referring Provider: Lorelei Pont,  SPENCER Treating Provider/Extender: Melburn Hake, HOYT Weeks in Treatment: 4 Active Problems ICD-10 Evaluated Encounter Code Description Active Date Today Diagnosis E11.621 Type 2 diabetes mellitus  with foot ulcer 02/09/2019 No Yes E11.42 Type 2 diabetes mellitus with diabetic polyneuropathy 02/09/2019 No Yes L97.522 Non-pressure chronic ulcer of other part of left foot with fat 02/09/2019 No Yes layer exposed M21.372 Foot drop, left foot 02/09/2019 No Yes Inactive Problems Resolved Problems Electronic Signature(s) Signed: 03/13/2019 5:47:36 PM By: Worthy Keeler [name] Entered By: Worthy Keeler on 03/12/2019 12:49:16 [name] ([id number]) -------------------------------------------------------------------------------- Progress Note Details Patient Name: [name] Date of Service: 03/12/2019 12:30 PM Medical Record Number: [id number] Patient Account Number: [id number] Date of Birth/Sex: July 25, 1945 (73 y.o. M) Treating RN: Harold Barban Primary Care Provider: [name] Other Clinician: Referring Provider: [name] Treating Provider/Extender: Melburn Hake, HOYT Weeks in Treatment: 4 Subjective Chief Complaint Information obtained from Patient Left dorsal foot ulcer 02/09/2019; patient is here for review of the wound on the left lateral foot roughly at the level of the base of the fifth metatarsal History of Present Illness (HPI) 10/04/16 on evaluation today patient presents for initial evaluation concerning a laceration of the right wrist and hand on the bowler aspect. He tells me that he fell into glass following a medication change in regard to his diabetes. He initially went to [facility] where she was sutured and subsequently his daughter who is a Marine scientist removed the teachers at the appropriate time. Unfortunately the wound has done poorly and dehissed which has caused him some trouble including infection. He has been on antibiotics both topically and orally though the wound continues to show signs of necrotic tissue according to notes which is what he was sent to [place] for evaluation concerning. He does have diabetes and unfortunately this  is severely uncontrolled. He does see an endocrinologist and is working on this. 10/11/2016 -- the patient's notes from the ER were reviewed and he had his suturing done on 09/17/2016 and had necrotic skin flaps which had to be trimmed the last visit he was here. He has managed to get his Santyl ointment and has been using this appropriately. 10/21/16 patient's wound appears to be doing very well on evaluation today. He still has some Slough covering the wound bed but this is nowhere near as severe as when i saw this initially two weeks ago nor even my review of his pictures last week. I'm pleased with how this is progressing. Patient's wound over the right form appears to be doing fairly well on evaluation today. There is no evidence of infection and he has a small area at the crease of where she is wrist extends and flexes that is still open and appears to be having difficulty due to the fact that it is cracking open. I think this is something that may be controlled with moisture control that is keeping the wound a little bit more moist. Nonetheless this is a tiny region compared to the initial wound and overall appears to be doing very well. No fevers, chills, nausea, or vomiting noted at this time. 11/25/16 on evaluation today patient appears to be doing well in regard to his right wrist ulcer. In fact this appears to be completely healed he is having no discomfort. He is pleased with how this is progressed. Readmission: 06/24/17 on evaluation today patient appears back in our clinic regarding issues that he has been having with his right ankle on the lateral malleolus as  well is in the dorsal surface of his left foot. Both have been going on for several weeks unfortunately. He tells me that initially this was being managed by Dr. Edilia Bo and I did review the notes from Dr. Edilia Bo in epic. Patient was placed on amoxicillin initially and then subsequently Keflex following. He has been using  triple antibiotic ointment over-the-counter along with a Band-Aid for the wound currently. He did not have any Santyl remaining. His most recent hemoglobin A1c which was just two days ago was 10.3. Fortunately he seems to have excellent blood flow in the bilateral lower extremities with his left ABI being 0.95 in the right ABI 1.01. Patient does have some pain in regard to each of these ulcerations but he tells me it's not nearly as severe as the hand wound that I help treat earlier over the summer. This was in 2018. 07/15/17 on evaluation today patient's lower surety ulcer is actually both appear to be doing fairly well at this point. He does have a little bit of skin overlapping the ulcer on the right lateral ankle this was debrided away today just with saline and gauze without complication patient had no discomfort or pain. Otherwise patient's left foot ulcer does appear to be showing signs of CLENNON, NASCA. ([id number]) improvement he does seem to have a right great toe. Nikki and noted at this point in time. I think that this does need to be managed at this point. No fevers, chills, nausea, or vomiting noted at this time. 07/22/17 on evaluation today patient's wounds in regard to his lower extremities appear to be doing somewhat better. His right ankle wound in fact appears to likely be completely closed. The left dorsal foot ulcer though still open does appear to be a little bit smaller and does also seem to be making good progress. He has not been having problems with the treatment at this point in the general he does not seem to show as much erythema surrounding the wound bed which is good news. He is not having significant discomfort. 08/12/17 on evaluation today patients wounds of both appeared to be doing better in fact they both appear to be completely healed. Overall I'm very pleased with how things have progressed over the past week even more than what I expected. Obviously this is good  news and he is happy about this. He did have a fall when going to his orthopedic doctor recently fortunately he does not appear to have injured anything severely but nonetheless does have a couple of bumps and bruises nothing significant at this time but he needs to be seen and wound care for. He may have broken his left wrist he is following up with orthopedics in that regard. Readmission: 10/24/17 on evaluation today patient presents for reevaluation after having been discharged Aug 12, 2017. His wound was healed at that point although he states that it has been somewhat discolored since that time. He has not experienced the drainage as far as the wound is concerned but the fact that it was still discolored been greater than two months after the fact made him want to come in for reevaluation. Upon inspection today the patient has no pain although he does have neuropathy. He does have a discolored region of the dorsal surface of the left foot which was the site where the ulcer was that we previously treated. The good news is he does not again have any discomfort although obviously I do believe this may be  some scar tissue and just discoloration in that regard. Again he hasn't had any drainage or discharge since I last saw him and at this point I see no evidence of that either this area shows complete epithelialization. In general I do not feel like there's any significant issue at this point in that regard. READMISSION 02/09/2019 This is a 73 year old man with type 2 diabetes poorly controlled with significant peripheral neuropathy. He has been in this clinic before in 2018 with a laceration on his right hand and then again in 2019 with an area over the right lateral malleolus. 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 He states that the wound is been there for the last week or 2. It is been uncomfortable to walk on.  The man is an active man having to look after an elderly mother. Past medical history, type 2 diabetes with polyneuropathy with a recent hemoglobin A1c in September 2011 0.3, lumbar disc disease, hypergonadism., Left foot drop and gout. We did not repeat his ABIs in the clinic quoting values from July 2019 of 0.95 and 1.01. He is not felt to have significant PAD 02/19/2019 on evaluation today patient actually appears to be doing better with regard to his foot wound based on what I am seeing currently. He was seen by Dr. Dellia Nims on readmission back into the clinic last week when I was off. The good news is the patient has been tolerating the dressing changes without any complication in fact has not really had much in the way of drainage. He does have an AFO brace which she believes is actually pushing on this area as well and has contributed to the issue. Fortunately there is no signs of active infection at this point. His ABIs appear to be well. 03/05/2019 on evaluation today patient appears to be doing well in regard to his foot ulcer. He has been tolerating the dressing changes without complication. Fortunately there is no signs of active infection. The wound is measuring somewhat smaller and overall I feel like he is making good progress at this point. 03/12/2019 on evaluation today patient actually appears to be doing quite well with regard to his foot ulcer. The wound is looking better the measurement is not significantly better at this point unfortunately but nonetheless again the overall appearance is improving I think he is making some progress. I still think the big issue here is foot drop and again the patient wants to see if potentially he could get a brace to help with this. Subsequently I think that we can definitely make a referral to [facility] to see if they can help [place] in this regard. He is in agreement with that plan. Patient History [name], [name] ([id number]) Information  obtained from Patient. Family History Cancer - Siblings, Diabetes - Father, Hypertension - Father, Kidney Disease - Mother, Stroke - Father, No family history of Heart Disease, Lung Disease, Thyroid Problems, Tuberculosis. Social History Former smoker, Marital Status - Widowed, Alcohol Use - Never, Drug Use - No History, Caffeine Use - Moderate. Medical History Eyes Patient has history of Cataracts - 2018 Ear/Nose/Mouth/Throat Patient has history of Middle ear problems Denies history of Chronic sinus problems/congestion Hematologic/Lymphatic Denies history of Anemia, Hemophilia, Human Immunodeficiency Virus, Lymphedema, Sickle Cell Disease Respiratory Patient has history of Sleep Apnea Denies history of Aspiration, Asthma, Chronic Obstructive Pulmonary Disease (COPD), Pneumothorax, Tuberculosis Cardiovascular Patient has  history of Hypertension Denies history of Angina, Arrhythmia, Congestive Heart Failure, Coronary Artery Disease, Deep Vein Thrombosis, Hypotension, Myocardial Infarction, Peripheral Arterial Disease, Peripheral Venous Disease, Phlebitis, Vasculitis Gastrointestinal Denies history of Cirrhosis , Colitis, Crohn s, Hepatitis A, Hepatitis B, Hepatitis C Endocrine Patient has history of Type II Diabetes Genitourinary Denies history of End Stage Renal Disease Immunological Denies history of Lupus Erythematosus, Raynaud s, Scleroderma Musculoskeletal Patient has history of Gout - history, Osteoarthritis Denies history of Rheumatoid Arthritis, Osteomyelitis Neurologic Patient has history of Neuropathy Denies history of Dementia, Quadriplegia, Paraplegia, Seizure Disorder Oncologic Denies history of Received Chemotherapy, Received Radiation Medical And Surgical History Notes 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 Respiratory Denies complaints or symptoms of Chronic or frequent coughs, Shortness of Breath. Cardiovascular Denies complaints or symptoms of Chest pain, LE edema. Psychiatric Denies complaints or symptoms of Anxiety, Claustrophobia. [name], [name] ([id number]) Objective Constitutional Well-nourished and well-hydrated in no acute distress. Vitals Time Taken: 12:38 PM, Height: 71 in, Weight: 205 lbs, BMI: 28.6, Temperature: 98.7 F, Pulse: 85 bpm, Respiratory Rate: 16 breaths/min, Blood Pressure: 162/72 mmHg. Respiratory normal breathing without difficulty. clear to auscultation bilaterally. Cardiovascular regular rate and rhythm with normal S1, S2. Psychiatric this patient is able to make decisions and demonstrates good insight into disease process. Alert and Oriented x 3. pleasant and cooperative. General Notes: Patient's wound bed currently showed signs of good granulation at this time. Fortunately there is no evidence of active infection which is also good news. No fevers, chills, nausea, vomiting, or diarrhea. I did have to perform some sharp debridement to clear away necrotic tissue and post debridement the wound bed appears to be doing significantly better which is great news. Integumentary (Hair, Skin) Wound #5 status is Open. Original cause of wound was Pressure Injury. The wound is located on the Left,Lateral Foot. The wound measures 0.6cm length x 0.7cm width x 0.2cm depth; 0.33cm^2 area and 0.066cm^3 volume. There is Fat Layer (Subcutaneous Tissue) Exposed exposed. There is no tunneling or undermining noted. There is a medium amount of serous drainage noted. The wound margin is thickened. There is medium (34-66%) pale granulation within the wound bed. There is a medium (34-66%) amount of necrotic tissue within the wound bed including Adherent Slough. 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 There was a Excisional Skin/Subcutaneous Tissue Debridement with a total area of 0.42 sq cm performed by [name], HOYT E., [name]. With the following instrument(s): Curette to remove Viable and Non-Viable tissue/material. Material removed includes Callus, Subcutaneous Tissue, and Slough after achieving pain control using Lidocaine. No specimens were taken. A time out was conducted at 12:54, prior to the start of the procedure. There was no bleeding. The procedure was tolerated well with a pain level of 0 throughout and a pain level of 0 following the procedure. Post Debridement Measurements: 0.6cm length x 0.7cm width x 0.2cm depth; 0.066cm^3 volume. Character of Wound/Ulcer Post Debridement is improved. Severity of Tissue Post Debridement is: Fat  layer exposed. Post procedure Diagnosis Wound #5: Same as Pre-Procedure Plan Wound Cleansing: Wound #5 Left,Lateral Foot: Clean wound with Normal Saline. Cleanse wound with mild soap and water May Shower, gently pat wound dry prior to applying new dressing. Primary Wound Dressing: Wound #5 Left,Lateral Foot: Silver Collagen Secondary Dressing: Wound #5 Left,Lateral Foot: Conform/Kerlix - Rolled gauze Foam - To pad area Dressing Change Frequency: Wound #5 Left,Lateral Foot: Change dressing every other day. Follow-up Appointments: Wound #5 Left,Lateral Foot: Return Appointment in 2 weeks. Off-Loading: Wound #5 Left,Lateral Foot: Open toe surgical shoe with peg assist. - left foot Consults ordered were: Podiatry - [facility]-orthotic evaluation due to foot  drop causing wound ulcers 1. I would recommend currently that we go ahead and continue with the silver collagen dressing as that seems to be beneficial for the patient. 2. I am also can recommend that we continue with attempting to provide appropriate offloading again I think the foot drop is a big issue here as well. I am get a suggest we make a referral to [facility] to see if they could help with some counter brace to help with the foot drop. The patient would like something external not really internal to his shoe. Again I am not sure exactly what options are available for all that but I will let him have this conversation with the orthotic specialist at [facility]. 3. I would recommend as well that in the meantime he attempt as much offloading and prevention of walking on the area as possible. Obviously less he can walk the better things will do. We will see patient back for reevaluation in 2 weeks here in the clinic. If anything worsens or changes patient will contact our office for additional recommendations. [name], [name] ([id number]) Electronic Signature(s) Signed: 03/13/2019 5:47:36 PM By: Worthy Keeler [name] Entered By: Worthy Keeler on 03/12/2019 13:02:55 [name], [name] ([id number]) -------------------------------------------------------------------------------- ROS/PFSH Details Patient Name: [name] Date of Service: 03/12/2019 12:30 PM Medical Record Number: [id number] Patient Account Number: [id number] Date of Birth/Sex: [birth year] (73 y.o. M) Treating RN: Harold Barban Primary Care Provider: [name] Other Clinician: Referring Provider: [name] Treating Provider/Extender: Melburn Hake, HOYT Weeks in Treatment: 4 Information Obtained From Patient Constitutional Symptoms (General Health) Complaints and Symptoms: Negative for: Fatigue; Fever; Chills; Marked Weight Change Respiratory Complaints and Symptoms: Negative for:  Chronic or frequent coughs; Shortness of Breath Medical History: Positive for: Sleep Apnea Negative for: Aspiration; Asthma; Chronic Obstructive Pulmonary Disease (COPD); Pneumothorax; Tuberculosis Cardiovascular Complaints and Symptoms: Negative for: Chest pain; LE edema Medical History: Positive for: Hypertension Negative for: Angina; Arrhythmia; Congestive Heart Failure; Coronary Artery Disease; Deep Vein Thrombosis; Hypotension; Myocardial Infarction; Peripheral Arterial Disease; Peripheral Venous Disease; Phlebitis; Vasculitis Past Medical History Notes: CVA in 2015 Psychiatric Complaints and Symptoms: Negative for: Anxiety; Claustrophobia Eyes Medical History: Positive for: Cataracts - 2018 Ear/Nose/Mouth/Throat Medical History: Positive for: Middle ear problems Negative for: Chronic sinus problems/congestion Hematologic/Lymphatic Medical History: Negative for: Anemia; Hemophilia; Human Immunodeficiency Virus; Lymphedema; Sickle Cell Disease [name], [name] ([id number]) Gastrointestinal Medical History: Negative for: Cirrhosis ; Colitis; Crohnos; Hepatitis A; Hepatitis B; Hepatitis C Endocrine Medical History: Positive for: Type II Diabetes Time with diabetes: 20 yrs Treated with: Insulin Blood sugar tested every day: Yes Tested : TID Blood sugar testing results: Bedtime: 130 Genitourinary Medical History: Negative for: End Stage Renal Disease Past Medical History Notes: "weak kidneys" - is a patient at [facility] History: Negative for: Lupus Erythematosus;  Raynaudos; Scleroderma Musculoskeletal Medical History: Positive for: Gout - history; Osteoarthritis Negative for: Rheumatoid Arthritis; Osteomyelitis Neurologic Medical History: Positive for: Neuropathy Negative for: Dementia; Quadriplegia; Paraplegia; Seizure Disorder Oncologic Medical History: Negative for: Received Chemotherapy; Received Radiation HBO Extended History  Items Eyes: Ear/Nose/Mouth/Throat: Cataracts Middle ear problems Immunizations Pneumococcal Vaccine: Received Pneumococcal Vaccination: Yes Immunization Notes: up to date Implantable Devices None Family and Social History [name], [name] ([id number]) Cancer: Yes - Siblings; Diabetes: Yes - Father; Heart Disease: No; Hypertension: Yes - Father; Kidney Disease: Yes - Mother; Lung Disease: No; Stroke: Yes - Father; Thyroid Problems: No; Tuberculosis: No; Former smoker; Marital Status - Widowed; Alcohol Use: Never; Drug Use: No History; Caffeine Use: Moderate; Financial Concerns: No; Food, Clothing or Shelter Needs: No; Support System Lacking: No; Transportation Concerns: No Physician Affirmation I have reviewed and agree with the above information. Electronic Signature(s) Signed: 03/13/2019 5:47:36 PM By: Worthy Keeler [name] Signed: 03/14/2019 4:10:47 PM By: Harold Barban Entered By: Worthy Keeler on 03/12/2019 13:01:16 [name] ([id number]) -------------------------------------------------------------------------------- SuperBill Details Patient Name: [name] Date of Service: 03/12/2019 Medical Record Number: [id number] Patient Account Number: [id number] Date of Birth/Sex: 1945/10/28 (73 y.o. M) Treating RN: Harold Barban Primary Care Provider: [name] Other Clinician: Referring Provider: [name] Treating Provider/Extender: Melburn Hake, HOYT Weeks in Treatment: 4 Diagnosis Coding ICD-10 Codes Code Description E11.621 Type 2 diabetes mellitus with foot ulcer E11.42 Type 2 diabetes mellitus with diabetic polyneuropathy L97.522 Non-pressure chronic ulcer of other part of left foot with fat layer exposed M21.372 Foot drop, left foot Facility Procedures CPT4 Code: 16073710 Description: 62694 - DEB SUBQ TISSUE 20 SQ CM/< ICD-10 Diagnosis Description L97.522 Non-pressure chronic ulcer of other part of left foot with fat Modifier: layer  exposed Quantity: 1 Physician Procedures CPT4 Code: 8546270 Description: 35009 - WC PHYS LEVEL 4 - EST PT ICD-10 Diagnosis Description E11.621 Type 2 diabetes mellitus with foot ulcer E11.42 Type 2 diabetes mellitus with diabetic polyneuropathy L97.522 Non-pressure chronic ulcer of other part of left foot with  fat M21.372 Foot drop, left foot Modifier: 25 layer exposed Quantity: 1 CPT4 Code: 3818299 Description: 37169 - WC PHYS SUBQ TISS 20 SQ CM ICD-10 Diagnosis Description L97.522 Non-pressure chronic ulcer of other part of left foot with fat Modifier: layer exposed Quantity: 1 Electronic Signature(s) Signed: 03/13/2019 5:47:36 PM By: Worthy Keeler [name] Entered By: Worthy Keeler on 03/12/2019 13:03:31

## 2019-03-15 ENCOUNTER — Encounter: Payer: Self-pay | Admitting: Podiatry

## 2019-03-15 ENCOUNTER — Ambulatory Visit (INDEPENDENT_AMBULATORY_CARE_PROVIDER_SITE_OTHER): Payer: Medicare Other | Admitting: Podiatry

## 2019-03-15 ENCOUNTER — Other Ambulatory Visit: Payer: Self-pay

## 2019-03-15 DIAGNOSIS — M21372 Foot drop, left foot: Secondary | ICD-10-CM

## 2019-03-15 DIAGNOSIS — M21379 Foot drop, unspecified foot: Secondary | ICD-10-CM | POA: Insufficient documentation

## 2019-03-15 DIAGNOSIS — L97511 Non-pressure chronic ulcer of other part of right foot limited to breakdown of skin: Secondary | ICD-10-CM

## 2019-03-15 NOTE — Progress Notes (Signed)
This patient presents to the office for an evaluation of his left foot.  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  He says he has been fitted with a brace that he was wearing.  He says that the brace caused an ulcer to form on the bottom outside of his left foot.  He says he has been going to the wound care center and they have been working on the ulcer and he says the ulcer is getting better as well as improving.  He presents the office today requesting an evaluation for a new brace to be worn on his left foot/leg.  He states he has been receiving treatment for 4 to 5 weeks from the wound care center.    Vascular  Dorsalis pedis and posterior tibial pulses are palpable  B/L.  Capillary return  WNL.  Temperature gradient is  WNL.  Skin turgor  WNL  Sensorium  Senn Weinstein monofilament wire diminished/absent.. Absent  tactile sensation.  Nail Exam  Patient has normal nails with no evidence of bacterial or fungal infection.  Orthopedic  Exam  Muscle tone and muscle strength  WNL except for dropfoot left foot/leg..  No limitations of motion feet  B/L.  No crepitus or joint effusion noted.  Foot type is unremarkable and digits show no abnormalities.  DJD 1st MPJ  B/L.    Skin ulcer noted on the plantar aspect of the left foot under the fifth meta base.  Patient has no drainage or infection noted around the ulcer.  Minimal necrotic tissue noted at the site of the ulcer left foot.   Diabetic ulcer left foot.  Dropfoot left.  Diabetes with neuropathy.  IE.  Discussed this condition with this patient.  Debrided outer rim of necrotic tissue around ulcer left foot.  He is very pleased with the treatment that he has received from the wound care center since the ulcer is closing.  He desires to receive a new brace to help him ambulate better.  He will be scheduled with Liliane Channel for an evaluation and a brace to help with his dropfoot.  I told him to continue to follow with the wound care  center and provided him with home soaking instructions if he wants to add that to his treatment regimen.  Return to the clinic as needed.   Gardiner Barefoot DPM

## 2019-03-15 NOTE — Patient Instructions (Signed)
After Care Instructions  1) Soak foot in soapy sudsy water for 15 minutes after removing the surgical bandage.  2) Re-bandage surgical site after the soaks with neosporin, a sterile 2x2 gauze pad and tape.  3) Perform this soak twice a day following redressing area as above.  4) Return to office in 1 week for re-evaluation.  5) Take Advil for pain medication as needed. If given an antibiotic, finish completely.  6) 2nd week-peroxide washes are recommended instead of soaks. Also air drying is recommended.

## 2019-03-16 ENCOUNTER — Ambulatory Visit: Payer: Medicare Other | Admitting: Podiatry

## 2019-03-20 ENCOUNTER — Telehealth: Payer: Self-pay

## 2019-03-20 MED ORDER — OZEMPIC (0.25 OR 0.5 MG/DOSE) 2 MG/1.5ML ~~LOC~~ SOPN: 0.5000 mg | PEN_INJECTOR | SUBCUTANEOUS | 0 refills | Status: DC

## 2019-03-20 NOTE — Telephone Encounter (Signed)
RX sent

## 2019-03-20 NOTE — Telephone Encounter (Signed)
MEDICATION: Ozemipic  PHARMACY:      [id number] - [place], [place] - [address]      IS THIS A 90 DAY SUPPLY : yes  IS PATIENT OUT OF MEDICATION: yes   IF NOT; HOW MUCH IS LEFT:   LAST APPOINTMENT DATE: @6 /29/2020  NEXT APPOINTMENT DATE:@2 /04/2019  DO WE HAVE YOUR PERMISSION TO LEAVE A DETAILED MESSAGE:  OTHER COMMENTS:    **Let patient know to contact pharmacy at the end of the day to make sure medication is ready. **  ** Please notify patient to allow 48-72 hours to process**  **Encourage patient to contact the pharmacy for refills or they can request refills through [facility]**

## 2019-03-21 ENCOUNTER — Other Ambulatory Visit: Payer: Self-pay

## 2019-03-21 ENCOUNTER — Ambulatory Visit: Payer: Medicare Other

## 2019-03-21 ENCOUNTER — Telehealth: Payer: Self-pay | Admitting: Podiatry

## 2019-03-21 ENCOUNTER — Encounter: Payer: Self-pay | Admitting: Family Medicine

## 2019-03-21 ENCOUNTER — Ambulatory Visit (INDEPENDENT_AMBULATORY_CARE_PROVIDER_SITE_OTHER): Payer: Medicare Other | Admitting: Family Medicine

## 2019-03-21 VITALS — BP 102/50 | HR 87 | Temp 98.7°F | Ht 71.5 in | Wt 205.5 lb

## 2019-03-21 DIAGNOSIS — R0781 Pleurodynia: Secondary | ICD-10-CM

## 2019-03-21 DIAGNOSIS — E291 Testicular hypofunction: Secondary | ICD-10-CM | POA: Diagnosis not present

## 2019-03-21 MED ORDER — TESTOSTERONE CYPIONATE 200 MG/ML IM SOLN
100.0000 mg | INTRAMUSCULAR | Status: DC
  Administered 2019-03-21 – 2019-05-16 (×2): 100 mg via INTRAMUSCULAR

## 2019-03-21 NOTE — Telephone Encounter (Signed)
Called pt to discuss appt that is scheduled with Liliane Channel for Friday 12.11 and he does not have voicemail set up.. I will call back later.

## 2019-03-21 NOTE — Progress Notes (Signed)
Zynasia Burklow T. Kveon Casanas, MD Primary Care and [place] at [facility] [place], [address] Phone: [phone]  FAX: [phone]  [name] - 73 y.o. male  [name] [id number]  Date of Birth: 08/14/1945  Visit Date: 03/21/2019  PCP: [name], MD  Referred by: [name], MD  Chief Complaint  Patient presents with  . Fall    last Friday  . Back Pain    Upper Rib Cage on Right Side    This visit occurred during the SARS-CoV-2 public health emergency.  Safety protocols were in place, including screening questions prior to the visit, additional usage of staff PPE, and extensive cleaning of exam room while observing appropriate contact time as indicated for disinfecting solutions.   Subjective:   [name] is a 73 y.o. very pleasant male patient with Body mass index is 28.26 kg/m. who presents with the following:  R side upper rib cage on the R side:  Fell on Friday and midafternoon.  Tokk a nap and didn't eat much and when he woke up and sugar felt down.  THen he went to the floor and hit the floor.  He felt like his blood sugar was low, and he was on his way to get some orange juice and he fell on his back on the right side.  He does have some pain.  He has been using a rib belt which seems to help.  Is also been taking some Tylenol.  Needs his testosterone.   a1c is 12   Past Medical History, Surgical History, Social History, Family History, Problem List, Medications, and Allergies have been reviewed and updated if relevant.  Patient Active Problem List   Diagnosis Date Noted  . Uncontrolled type 2 diabetes mellitus with peripheral neuropathy ([place]) 05/11/2016    Priority: Medium  . Former very heavy cigarette smoker (more than 40 per day) 10/07/2014    Priority: Medium  . Chronic kidney disease, stage III (moderate) 12/19/2012    Priority: Medium  . OSA (obstructive sleep apnea) 03/09/2011   Priority: Medium  . Diabetic neuropathy ([place]) 12/29/2010    Priority: Medium  . Hypertriglyceridemia 06/13/2008    Priority: Medium  . Major depressive disorder, recurrent episode, in partial remission ([place]) 06/13/2008    Priority: Medium  . Essential hypertension 06/13/2008    Priority: Medium  . Dropfoot 03/15/2019  . Abdominal pain, vomiting, and diarrhea 08/01/2018  . Dehydration 08/01/2018  . Mild cognitive impairment 05/08/2018  . Alteration consciousness 05/08/2018  . Poor balance 02/23/2018  . Falls 10/14/2017  . Esophageal dysphagia   . Foot ulcer ([place]) 06/10/2017  . Erectile dysfunction associated with type 2 diabetes mellitus ([place]) 05/28/2015  . Hypogonadism male 06/14/2012  . Chest pain 11/11/2011  . Obesity (BMI 30-39.9) 09/27/2011  . Family history of MI (myocardial infarction) 09/16/2010  . HIP REPLACEMENT, RIGHT, HX OF 02/05/2010  . GOUT 06/13/2008  . ALLERGIC RHINITIS 06/13/2008  . GERD 06/13/2008  . OSTEOARTHRITIS 06/13/2008    Past Medical History:  Diagnosis Date  . Allergic rhinitis due to pollen   . Chronic kidney disease, stage III (moderate) 12/19/2012  . Depression   . Diabetes mellitus ([place])   . Diverticulosis   . Erectile dysfunction associated with type 2 diabetes mellitus ([place]) 05/28/2015  . Former very heavy cigarette smoker (more than 40 per day) 10/07/2014  . GERD (gastroesophageal reflux disease)   . Gout   . Hyperlipidemia   .  Hypertension   . Hypogonadism male 06/14/2012  . Iron deficiency   . Memory loss   . Osteoarthritis   . Personal history of colonic polyps    1996    Past Surgical History:  Procedure Laterality Date  . CARDIAC CATHETERIZATION  11/2011   [facility]  . CHOLECYSTECTOMY  1980  . ESOPHAGOGASTRODUODENOSCOPY (EGD) WITH PROPOFOL N/A 09/27/2017   Procedure: ESOPHAGOGASTRODUODENOSCOPY (EGD) WITH PROPOFOL;  Surgeon: Lin Landsman, MD;  Location: [facility];  Service: Gastroenterology;  Laterality: N/A;  . EYE SURGERY  Right 07/29/2016   [facility] - Dr. Manuella Ghazi  . EYE SURGERY Left 08/23/2106   [facility] - Dr. Manuella Ghazi  . TOTAL HIP ARTHROPLASTY  2009   Dr. Gladstone Lighter  . TOTAL HIP ARTHROPLASTY      Social History   Socioeconomic History  . Marital status: Widowed    Spouse name: Not on file  . Number of children: 4  . Years of education: 12th  . Highest education level: Not on file  Occupational History  . Occupation: Counsellor  . 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 Financial resource strain: Not hard at all  . Food insecurity    Worry: Never true    Inability: Never true  . Transportation needs    Medical: No    Non-medical: No  Tobacco Use  . Smoking status: Former Smoker    Packs/day: 3.00    Years: 35.00    Pack years: 105.00    Types: Cigarettes    Quit date: 04/12/2000    Years since quitting: 18.9  . Smokeless tobacco: Never Used  Substance and Sexual Activity  . Alcohol use: No  . Drug use: No  . Sexual activity: Never  Lifestyle  . Physical activity    Days per week: 0 days    Minutes per session: 0 min  . Stress: Not at all  Relationships  . Social Herbalist on phone: Not on file    Gets together: Not on file    Attends religious service: Not on file    Active member of club or organization: Not on file    Attends meetings of clubs or organizations: Not on file    Relationship status: Not on file  . Intimate partner violence    Fear of current or ex partner: No    Emotionally abused: No    Physically abused: No    Forced sexual activity: No  Other Topics Concern  . Not on file  Social History Narrative   No regular exercise   Caffeine use: yes, dt coke   Lives at home with his mother lives with him.   Wife Fraser Din recently deceased   Right-handed.    Family History  Problem Relation Age of Onset  . Diabetes Mother   . Alzheimer's disease Mother   . Diabetes Father   . Lung cancer Father        Age 28  . Stroke  Father   . Heart attack Father   . Lung cancer Sister        lung  . Heart disease Maternal Grandfather   . COPD Sister   . Heart attack Sister   . Heart disease Sister     Allergies  Allergen Reactions  . Tetracycline Itching and Rash    Medication list reviewed and updated in full in [place].  GEN: No fevers, chills. Nontoxic. Primarily MSK c/o  today. MSK: Detailed in the HPI GI: tolerating PO intake without difficulty Neuro: No numbness, parasthesias, or tingling associated. Otherwise the pertinent positives of the ROS are noted above.   Objective:   BP (!) 102/50   Pulse 87   Temp 98.7 F (37.1 C) (Temporal)   Ht 5' 11.5" (1.816 m)   Wt 205 lb 8 oz (93.2 kg)   SpO2 98%   BMI 28.26 kg/m    GEN: WDWN, NAD, Non-toxic, Alert & Oriented x 3 HEENT: Atraumatic, Normocephalic.  Ears and Nose: No external deformity. EXTR: No clubbing/cyanosis/edema NEURO: Normal gait.  PSYCH: Normally interactive. Conversant. Not depressed or anxious appearing.  Calm demeanor.    Entirety of the left side of the patient's thorax is nontender and the right side at the scapula and clavicle is entirely nontender as well as the humerus.  Approximately rib 10 there is some pain posteriorly.  Radiology: No results found.  Assessment and Plan:     ICD-10-CM   1. Rib pain on left side  R07.81   2. Male hypogonadism  E29.1 testosterone cypionate (DEPOTESTOSTERONE CYPIONATE) injection 100 mg   Rib fracture versus contusion.  We discussed diagnostic accuracy, and Asad now both comfortable with no imaging today and supportive care.  We are going to give him Depo testosterone today.  The dosing of this he is not been quite clear on, and he should be only using one vial per injection.  150 mg every 2 weeks.  Follow-up: No follow-ups on file.  Meds ordered this encounter  Medications  . testosterone cypionate (DEPOTESTOSTERONE CYPIONATE) injection 100 mg   No orders of the  defined types were placed in this encounter.   Signed,  Maud Deed. Alfreida Steffenhagen, MD   Outpatient Encounter Medications as of 03/21/2019  Medication Sig  . acetaminophen (QC ACETAMINOPHEN 8HR ARTH PAIN) 650 MG CR tablet Take 650 mg by mouth every 8 (eight) hours as needed for pain.  [name] Kitchen ampicillin (PRINCIPEN) 500 MG capsule Take 500 mg by mouth daily.   . Ascorbic Acid (VITAMIN C) 1000 MG tablet Take 1,000 mg by mouth daily.  [name] Kitchen aspirin EC 81 MG tablet Take 81 mg by mouth at bedtime.  [name] Kitchen atenolol (TENORMIN) 50 MG tablet TAKE 1 TABLET BY MOUTH EVERY DAY  . cholecalciferol (VITAMIN D) 1000 UNITS tablet Take 1,000 Units by mouth daily.    . colchicine 0.6 MG tablet Take 1 tablet (0.6 mg total) by mouth 2 (two) times daily.  [name] Kitchen donepezil (ARICEPT) 10 MG tablet Take 1 tablet (10 mg total) by mouth at bedtime.  [name] Kitchen doxazosin (CARDURA) 8 MG tablet TAKE ONE-HALF TABLET AT BEDTIME  . finasteride (PROSCAR) 5 MG tablet Take 1 tablet (5 mg total) by mouth daily.  . fluticasone (FLONASE) 50 MCG/ACT nasal spray PLACE 2 SPRAYS IN EACH NOSTRIL DAILY  . ibuprofen (ADVIL,MOTRIN) 200 MG tablet Take 200-400 mg by mouth every 6 (six) hours as needed for headache (pain).  . indomethacin (INDOCIN) 50 MG capsule Take 1 capsule (50 mg total) by mouth 2 (two) times daily with a meal.  . insulin NPH Human (HUMULIN N,NOVOLIN N) 100 UNIT/ML injection Inject 0.25 mLs (25 Units total) into the skin 2 (two) times daily before a meal.  . insulin regular (NOVOLIN R,HUMULIN R) 100 units/mL injection Inject 0.2 mLs (20 Units total) into the skin 2 (two) times daily before a meal.  . lisinopril (ZESTRIL) 10 MG tablet TAKE 1 TABLET BY MOUTH EVERY DAY  . memantine (NAMENDA) 10  MG tablet Take 1 tablet (10 mg total) by mouth 2 (two) times daily.  . ondansetron (ZOFRAN) 4 MG tablet Take 1 tablet (4 mg total) by mouth every 8 (eight) hours as needed for nausea or vomiting.  . Semaglutide,0.25 or 0.5MG /DOS, (OZEMPIC, 0.25 OR 0.5 MG/DOSE,) 2  MG/1.5ML SOPN Inject 0.5 mg into the skin once a week.  . sildenafil (REVATIO) 20 MG tablet Generic Revatio / Sildanefil 20 mg. 2 - 5 tabs 30 mins prior to intercourse.  . simvastatin (ZOCOR) 40 MG tablet Take 1 tablet (40 mg total) by mouth at bedtime.  [name] Kitchen testosterone cypionate (DEPOTESTOSTERONE CYPIONATE) 200 MG/ML injection Inject 0.75 mLs (150 mg total) into the muscle every 14 (fourteen) days.  [name] Kitchen triamcinolone cream (KENALOG) 0.1 %   . valACYclovir (VALTREX) 1000 MG tablet Take 1,000 mg by mouth 3 (three) times daily.  [name] Kitchen venlafaxine XR (EFFEXOR-XR) 75 MG 24 hr capsule TAKE 1 CAPSULE BY MOUTH EVERY MORNING AND 2 CAPSULES AT BEDTIME  . omeprazole (PRILOSEC) 40 MG capsule Take 1 capsule (40 mg total) by mouth 2 (two) times daily before a meal.  . [DISCONTINUED] bacitracin-polymyxin b (POLYSPORIN) ophthalmic ointment   . [DISCONTINUED] gabapentin (NEURONTIN) 300 MG capsule Take 2 capsules (600 mg total) by mouth at bedtime.   Facility-Administered Encounter Medications as of 03/21/2019  Medication  . testosterone cypionate (DEPOTESTOSTERONE CYPIONATE) injection 100 mg  . testosterone cypionate (DEPOTESTOSTERONE CYPIONATE) injection 150 mg  . testosterone cypionate (DEPOTESTOSTERONE CYPIONATE) injection 150 mg

## 2019-03-23 ENCOUNTER — Ambulatory Visit: Payer: Medicare Other | Admitting: Orthotics

## 2019-03-23 ENCOUNTER — Other Ambulatory Visit: Payer: Self-pay

## 2019-03-23 DIAGNOSIS — M21372 Foot drop, left foot: Secondary | ICD-10-CM

## 2019-03-23 DIAGNOSIS — R2689 Other abnormalities of gait and mobility: Secondary | ICD-10-CM

## 2019-03-23 DIAGNOSIS — G3184 Mild cognitive impairment, so stated: Secondary | ICD-10-CM

## 2019-03-26 ENCOUNTER — Other Ambulatory Visit: Payer: Self-pay

## 2019-03-26 ENCOUNTER — Encounter: Payer: Medicare Other | Attending: Physician Assistant | Admitting: Physician Assistant

## 2019-03-26 ENCOUNTER — Other Ambulatory Visit
Admission: RE | Admit: 2019-03-26 | Discharge: 2019-03-26 | Disposition: A | Discharge status: Home / Self Care | Payer: Medicare Other | Source: Ambulatory Visit | Attending: Physician Assistant | Admitting: Physician Assistant

## 2019-03-26 DIAGNOSIS — Z881 Allergy status to other antibiotic agents status: Secondary | ICD-10-CM | POA: Insufficient documentation

## 2019-03-26 DIAGNOSIS — Z8249 Family history of ischemic heart disease and other diseases of the circulatory system: Secondary | ICD-10-CM | POA: Insufficient documentation

## 2019-03-26 DIAGNOSIS — M21372 Foot drop, left foot: Secondary | ICD-10-CM | POA: Diagnosis not present

## 2019-03-26 DIAGNOSIS — I10 Essential (primary) hypertension: Secondary | ICD-10-CM | POA: Diagnosis not present

## 2019-03-26 DIAGNOSIS — Z794 Long term (current) use of insulin: Secondary | ICD-10-CM | POA: Insufficient documentation

## 2019-03-26 DIAGNOSIS — B999 Unspecified infectious disease: Secondary | ICD-10-CM | POA: Diagnosis present

## 2019-03-26 DIAGNOSIS — S61511A Laceration without foreign body of right wrist, initial encounter: Secondary | ICD-10-CM | POA: Insufficient documentation

## 2019-03-26 DIAGNOSIS — Z833 Family history of diabetes mellitus: Secondary | ICD-10-CM | POA: Insufficient documentation

## 2019-03-26 DIAGNOSIS — E1142 Type 2 diabetes mellitus with diabetic polyneuropathy: Secondary | ICD-10-CM | POA: Diagnosis not present

## 2019-03-26 DIAGNOSIS — M109 Gout, unspecified: Secondary | ICD-10-CM | POA: Insufficient documentation

## 2019-03-26 DIAGNOSIS — M1990 Unspecified osteoarthritis, unspecified site: Secondary | ICD-10-CM | POA: Insufficient documentation

## 2019-03-26 DIAGNOSIS — X58XXXA Exposure to other specified factors, initial encounter: Secondary | ICD-10-CM | POA: Diagnosis not present

## 2019-03-26 DIAGNOSIS — Z809 Family history of malignant neoplasm, unspecified: Secondary | ICD-10-CM | POA: Insufficient documentation

## 2019-03-26 DIAGNOSIS — E1151 Type 2 diabetes mellitus with diabetic peripheral angiopathy without gangrene: Secondary | ICD-10-CM | POA: Insufficient documentation

## 2019-03-26 DIAGNOSIS — E11621 Type 2 diabetes mellitus with foot ulcer: Secondary | ICD-10-CM | POA: Diagnosis not present

## 2019-03-26 DIAGNOSIS — Z87891 Personal history of nicotine dependence: Secondary | ICD-10-CM | POA: Insufficient documentation

## 2019-03-26 DIAGNOSIS — L97522 Non-pressure chronic ulcer of other part of left foot with fat layer exposed: Secondary | ICD-10-CM | POA: Diagnosis not present

## 2019-03-26 DIAGNOSIS — L539 Erythematous condition, unspecified: Secondary | ICD-10-CM | POA: Diagnosis not present

## 2019-03-26 NOTE — Progress Notes (Addendum)
[name] ([id number]) Visit Report for 03/26/2019 Chief Complaint Document Details Patient Name: [name] Date of Service: 03/26/2019 12:30 PM Medical Record Number: [id number] Patient Account Number: [id number] Date of Birth/Sex: 07-30-1945 (73 y.o. M) Treating RN: Harold Barban Primary Care Provider: [name] Other Clinician: Referring Provider: [name] Treating Provider/Extender: Melburn Hake, Lewis Grivas Weeks in Treatment: 6 Information Obtained from: Patient Chief Complaint Left dorsal foot ulcer 02/09/2019; patient is here for review of the wound on the left lateral foot roughly at the level of the base of the fifth metatarsal Electronic Signature(s) Signed: 03/26/2019 12:55:19 PM By: Worthy Keeler [name] Entered By: Worthy Keeler on 03/26/2019 12:55:19 [name] ([id number]) -------------------------------------------------------------------------------- Debridement Details Patient Name: [name] Date of Service: 03/26/2019 12:30 PM Medical Record Number: [id number] Patient Account Number: [id number] Date of Birth/Sex: Jun 13, 1945 (73 y.o. M) Treating RN: Harold Barban Primary Care Provider: [name] Other Clinician: Referring Provider: [name] Treating Provider/Extender: Melburn Hake, Haili Donofrio Weeks in Treatment: 6 Debridement Performed for Wound #5 Left,Lateral Foot Assessment: Performed By: Physician [name], [name], [name] Debridement Type: Debridement Severity of Tissue Pre Fat layer exposed Debridement: Level of Consciousness (Pre- Awake and Alert procedure): Pre-procedure Verification/Time Yes - 13:19 Out Taken: Start Time: 13:19 Pain Control: Lidocaine Total Area Debrided (L x W): 0.7 (cm) x 0.9 (cm) = 0.63 (cm) Tissue and other material Viable, Non-Viable, Callus, Slough, Subcutaneous, Slough debrided: Level: Skin/Subcutaneous Tissue Debridement Description: Excisional Instrument: Curette,  Forceps, Scissors Specimen: Swab, Number of Specimens Taken: 1 Bleeding: Minimum Hemostasis Achieved: Pressure End Time: 13:26 Procedural Pain: 0 Post Procedural Pain: 0 Response to Treatment: Procedure was tolerated well Level of Consciousness Awake and Alert (Post-procedure): Post Debridement Measurements of Total Wound Length: (cm) 0.7 Width: (cm) 0.9 Depth: (cm) 0.2 Volume: (cm) 0.099 Character of Wound/Ulcer Post Debridement: Improved Severity of Tissue Post Debridement: Fat layer exposed Post Procedure Diagnosis Same as Pre-procedure Electronic Signature(s) Signed: 03/26/2019 4:05:06 PM By: Harold Barban Signed: 03/26/2019 5:30:09 PM By: Worthy Keeler [name] Entered By: Harold Barban on 03/26/2019 13:26:43 [name] ([name] ([id number]) -------------------------------------------------------------------------------- HPI Details Patient Name: [name] Date of Service: 03/26/2019 12:30 PM Medical Record Number: [id number] Patient Account Number: [id number] Date of Birth/Sex: May 11, 1945 (73 y.o. M) Treating RN: Harold Barban Primary Care Provider: [name] Other Clinician: Referring Provider: [name] Treating Provider/Extender: Melburn Hake, Tyresse Jayson Weeks in Treatment: 6 History of Present Illness HPI Description: 10/04/16 on evaluation today patient presents for initial evaluation concerning a laceration of the right wrist and hand on the bowler aspect. He tells me that he fell into glass following a medication change in regard to his diabetes. He initially went to [facility] where she was sutured and subsequently his daughter who is a Marine scientist removed the teachers at the appropriate time. Unfortunately the wound has done poorly and dehissed which has caused him some trouble including infection. He has been on antibiotics both topically and orally though the wound continues to show signs of necrotic tissue according  to notes which is what he was sent to [place] for evaluation concerning. He does have diabetes and unfortunately this is severely uncontrolled. He does see an endocrinologist and is working on this. 10/11/2016 -- the patient's notes from the ER were reviewed and he had his suturing done on 09/17/2016 and had necrotic skin flaps which had to be trimmed the last visit he was here. He has managed to get his Santyl ointment  and has been using this appropriately. 10/21/16 patient's wound appears to be doing very well on evaluation today. He still has some Slough covering the wound bed but this is nowhere near as severe as when i saw this initially two weeks ago nor even my review of his pictures last week. I'm pleased with how this is progressing. Patient's wound over the right form appears to be doing fairly well on evaluation today. There is no evidence of infection and he has a small area at the crease of where she is wrist extends and flexes that is still open and appears to be having difficulty due to the fact that it is cracking open. I think this is something that may be controlled with moisture control that is keeping the wound a little bit more moist. Nonetheless this is a tiny region compared to the initial wound and overall appears to be doing very well. No fevers, chills, nausea, or vomiting noted at this time. 11/25/16 on evaluation today patient appears to be doing well in regard to his right wrist ulcer. In fact this appears to be completely healed he is having no discomfort. He is pleased with how this is progressed. Readmission: 06/24/17 on evaluation today patient appears back in our clinic regarding issues that he has been having with his right ankle on the lateral malleolus as well is in the dorsal surface of his left foot. Both have been going on for several weeks unfortunately. He tells me that initially this was being managed by Dr. Edilia Bo and I did review the notes from Dr. Edilia Bo in  epic. Patient was placed on amoxicillin initially and then subsequently Keflex following. He has been using triple antibiotic ointment over-the-counter along with a Band-Aid for the wound currently. He did not have any Santyl remaining. His most recent hemoglobin A1c which was just two days ago was 10.3. Fortunately he seems to have excellent blood flow in the bilateral lower extremities with his left ABI being 0.95 in the right ABI 1.01. Patient does have some pain in regard to each of these ulcerations but he tells me it's not nearly as severe as the hand wound that I help treat earlier over the summer. This was in 2018. 07/15/17 on evaluation today patient's lower surety ulcer is actually both appear to be doing fairly well at this point. He does have a little bit of skin overlapping the ulcer on the right lateral ankle this was debrided away today just with saline and gauze without complication patient had no discomfort or pain. Otherwise patient's left foot ulcer does appear to be showing signs of improvement he does seem to have a right great toe. Nikki and noted at this point in time. I think that this does need to be managed at this point. No fevers, chills, nausea, or vomiting noted at this time. 07/22/17 on evaluation today patient's wounds in regard to his lower extremities appear to be doing somewhat better. His right ankle wound in fact appears to likely be completely closed. The left dorsal foot ulcer though still open does appear to be a little bit smaller and does also seem to be making good progress. He has not been having problems with the treatment at this point in the general he does not seem to show as much erythema surrounding the wound bed which is good news. He is not JADIS, MIKA E. ([id number]) having significant discomfort. 08/12/17 on evaluation today patients wounds of both appeared to be doing better in  fact they both appear to be completely healed. Overall I'm very  pleased with how things have progressed over the past week even more than what I expected. Obviously this is good news and he is happy about this. He did have a fall when going to his orthopedic doctor recently fortunately he does not appear to have injured anything severely but nonetheless does have a couple of bumps and bruises nothing significant at this time but he needs to be seen and wound care for. He may have broken his left wrist he is following up with orthopedics in that regard. Readmission: 10/24/17 on evaluation today patient presents for reevaluation after having been discharged Aug 12, 2017. His wound was healed at that point although he states that it has been somewhat discolored since that time. He has not experienced the drainage as far as the wound is concerned but the fact that it was still discolored been greater than two months after the fact made him want to come in for reevaluation. Upon inspection today the patient has no pain although he does have neuropathy. He does have a discolored region of the dorsal surface of the left foot which was the site where the ulcer was that we previously treated. The good news is he does not again have any discomfort although obviously I do believe this may be some scar tissue and just discoloration in that regard. Again he hasn't had any drainage or discharge since I last saw him and at this point I see no evidence of that either this area shows complete epithelialization. In general I do not feel like there's any significant issue at this point in that regard. READMISSION 02/09/2019 This is a 73 year old man with type 2 diabetes poorly controlled with significant peripheral neuropathy. He has been in this clinic before in 2018 with a laceration on his right hand and then again in 2019 with an area over the right lateral malleolus. 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 He states that the wound is been there for the last week or 2. It is been uncomfortable to walk on. The man is an active man having to look after an elderly mother. Past medical history, type 2 diabetes with polyneuropathy with a recent hemoglobin A1c in September 2011 0.3, lumbar disc disease, hypergonadism., Left foot drop and gout. We did not repeat his ABIs in the clinic quoting values from July 2019 of 0.95 and 1.01. He is not felt to have significant PAD 02/19/2019 on evaluation today patient actually appears to be doing better with regard to his foot wound based on what I am seeing currently. He was seen by Dr. Dellia Nims on readmission back into the clinic last week when I was off. The good news is the patient has been tolerating the dressing changes without any complication in fact has not really had much in the way of drainage. He does have an AFO brace which she believes is actually pushing on this area as well and has contributed to the issue. Fortunately there is no signs of active infection at this point. His ABIs appear to be well. 03/05/2019 on evaluation today patient appears to be doing well in regard to his foot ulcer. He has been tolerating the dressing changes without complication. Fortunately there is no signs of active infection. The wound is measuring somewhat smaller and overall I feel like he is  making good progress at this point. 03/12/2019 on evaluation today patient actually appears to be doing quite well with regard to his foot ulcer. The wound is looking better the measurement is not significantly better at this point unfortunately but nonetheless again the overall appearance is improving I think he is making some progress. I still think the big issue here is foot drop and again the patient wants to see if potentially he could get a brace to help with this. Subsequently I think that we can definitely make a referral to [facility] to see if they can help [place]  in this regard. He is in agreement with that plan. 03/26/2019 on evaluation today patient appears to be doing more poorly in regard to his foot ulcer at this time. He did see podiatry in order to see about getting a brace but unfortunately he tells me that Medicare is not likely to pay for one for him as he had 1 I guess 8 months ago. However this one that apparently goes in his shoes that does not seem to be working out well for him. Unfortunately there are signs of active infection at this time. No fevers, chills, nausea, vomiting, or diarrhea. Systemically. The patient has erythema on the dorsal surface of his foot that tracks from the lateral portion of his foot and again he does have a blister around the wound as well which is worse compared to previous. Overall I am concerned. Electronic Signature(s) GRIFFITH, SANTILLI ([id number]) Signed: 03/26/2019 1:29:11 PM By: Worthy Keeler [name] Entered By: Worthy Keeler on 03/26/2019 13:29:11 [name], [name] ([id number]) -------------------------------------------------------------------------------- Physical Exam Details Patient Name: [name] Date of Service: 03/26/2019 12:30 PM Medical Record Number: [id number] Patient Account Number: [id number] Date of Birth/Sex: 10-Nov-1945 (73 y.o. M) Treating RN: Harold Barban Primary Care Provider: [name] Other Clinician: Referring Provider: [name] Treating Provider/Extender: [name], Joyel Chenette Weeks in Treatment: 6 Constitutional Well-nourished and well-hydrated in no acute distress. Respiratory normal breathing without difficulty. Psychiatric this patient is able to make decisions and demonstrates good insight into disease process. Alert and Oriented x 3. pleasant and cooperative. Notes Wound bed actually appears to be doing significantly worse compared to last evaluation. There is erythema surrounding and then tracking up to the lateral/dorsal surface of his foot  which has me concerned in that regard. He is not having any increased pain which is good news. Nonetheless I did have to perform sharp debridement to clear away significant amount of callus as well as slough and necrotic tissue down to better subcutaneous tissue. He tolerated debridement today without complication which is good news. Electronic Signature(s) Signed: 03/26/2019 1:29:52 PM By: Worthy Keeler [name] Entered By: Worthy Keeler on 03/26/2019 13:29:51 [name] ([id number]) -------------------------------------------------------------------------------- Physician Orders Details Patient Name: [name] Date of Service: 03/26/2019 12:30 PM Medical Record Number: [id number] Patient Account Number: [id number] Date of Birth/Sex: Sep 01, 1945 (73 y.o. M) Treating RN: Harold Barban Primary Care Provider: [name] Other Clinician: Referring Provider: [name] Treating Provider/Extender: Melburn Hake, Laporsha Grealish Weeks in Treatment: 6 Verbal / Phone Orders: No Diagnosis Coding ICD-10 Coding Code Description E11.621 Type 2 diabetes mellitus with foot ulcer E11.42 Type 2 diabetes mellitus with diabetic polyneuropathy L97.522 Non-pressure chronic ulcer of other part of left foot with fat layer exposed M21.372 Foot drop, left foot Wound Cleansing Wound #5 Left,Lateral Foot o Clean wound with Normal Saline. o Cleanse wound with mild soap and water o May Shower, gently pat  wound dry prior to applying new dressing. Primary Wound Dressing Wound #5 Left,Lateral Foot o Silver Alginate Secondary Dressing Wound #5 Left,Lateral Foot o Conform/Kerlix - Rolled gauze o Foam - To pad area Dressing Change Frequency Wound #5 Left,Lateral Foot o Change dressing every other day. Follow-up Appointments Wound #5 Left,Lateral Foot o Return Appointment in 1 week. Off-Loading Wound #5 Left,Lateral Foot o Open toe surgical shoe with peg assist. - left  foot Laboratory o Bacteria identified in Wound by Culture (MICRO) - Left Lateral Foot oooo LOINC Code: 0093-8 oooo Convenience Name: Wound culture routine [name], [name] ([id number]) Patient Medications Allergies: tetracycline Notifications Medication Indication Start End clindamycin HCl 03/26/2019 DOSE 1 - oral 300 mg capsule - 1 capsule oral taken 3 times a day for 15 days Electronic Signature(s) Signed: 03/26/2019 1:32:51 PM By: Worthy Keeler [name] Entered By: Worthy Keeler on 03/26/2019 13:32:51 [name] ([id number]) -------------------------------------------------------------------------------- Problem List Details Patient Name: [name] Date of Service: 03/26/2019 12:30 PM Medical Record Number: [id number] Patient Account Number: [id number] Date of Birth/Sex: [birth year] (73 y.o. M) Treating RN: Harold Barban Primary Care Provider: [name] Other Clinician: Referring Provider: [name] Treating Provider/Extender: Melburn Hake, Alberto Schoch Weeks in Treatment: 6 Active Problems ICD-10 Evaluated Encounter Code Description Active Date Today Diagnosis E11.621 Type 2 diabetes mellitus with foot ulcer 02/09/2019 No Yes E11.42 Type 2 diabetes mellitus with diabetic polyneuropathy 02/09/2019 No Yes L97.522 Non-pressure chronic ulcer of other part of left foot with fat 02/09/2019 No Yes layer exposed M21.372 Foot drop, left foot 02/09/2019 No Yes Inactive Problems Resolved Problems Electronic Signature(s) Signed: 03/26/2019 12:55:13 PM By: Worthy Keeler [name] Entered By: Worthy Keeler on 03/26/2019 12:55:12 [name] ([id number]) -------------------------------------------------------------------------------- Progress Note Details Patient Name: [name] Date of Service: 03/26/2019 12:30 PM Medical Record Number: [id number] Patient Account Number: [id number] Date of Birth/Sex: 1945-05-24 (73 y.o. M) Treating RN: Harold Barban Primary Care Provider: [name] Other Clinician: Referring Provider: [name] Treating Provider/Extender: Melburn Hake, Kassidy Dockendorf Weeks in Treatment: 6 Subjective Chief Complaint Information obtained from Patient Left dorsal foot ulcer 02/09/2019; patient is here for review of the wound on the left lateral foot roughly at the level of the base of the fifth metatarsal History of Present Illness (HPI) 10/04/16 on evaluation today patient presents for initial evaluation concerning a laceration of the right wrist and hand on the bowler aspect. He tells me that he fell into glass following a medication change in regard to his diabetes. He initially went to [facility] where she was sutured and subsequently his daughter who is a Marine scientist removed the teachers at the appropriate time. Unfortunately the wound has done poorly and dehissed which has caused him some trouble including infection. He has been on antibiotics both topically and orally though the wound continues to show signs of necrotic tissue according to notes which is what he was sent to [place] for evaluation concerning. He does have diabetes and unfortunately this is severely uncontrolled. He does see an endocrinologist and is working on this. 10/11/2016 -- the patient's notes from the ER were reviewed and he had his suturing done on 09/17/2016 and had necrotic skin flaps which had to be trimmed the last visit he was here. He has managed to get his Santyl ointment and has been using this appropriately. 10/21/16 patient's wound appears to be doing very well on evaluation today. He still has some Slough covering the wound bed but this is nowhere near  as severe as when i saw this initially two weeks ago nor even my review of his pictures last week. I'm pleased with how this is progressing. Patient's wound over the right form appears to be doing fairly well on evaluation today. There is no evidence of infection and he has a small  area at the crease of where she is wrist extends and flexes that is still open and appears to be having difficulty due to the fact that it is cracking open. I think this is something that may be controlled with moisture control that is keeping the wound a little bit more moist. Nonetheless this is a tiny region compared to the initial wound and overall appears to be doing very well. No fevers, chills, nausea, or vomiting noted at this time. 11/25/16 on evaluation today patient appears to be doing well in regard to his right wrist ulcer. In fact this appears to be completely healed he is having no discomfort. He is pleased with how this is progressed. Readmission: 06/24/17 on evaluation today patient appears back in our clinic regarding issues that he has been having with his right ankle on the lateral malleolus as well is in the dorsal surface of his left foot. Both have been going on for several weeks unfortunately. He tells me that initially this was being managed by Dr. Edilia Bo and I did review the notes from Dr. Edilia Bo in epic. Patient was placed on amoxicillin initially and then subsequently Keflex following. He has been using triple antibiotic ointment over-the-counter along with a Band-Aid for the wound currently. He did not have any Santyl remaining. His most recent hemoglobin A1c which was just two days ago was 10.3. Fortunately he seems to have excellent blood flow in the bilateral lower extremities with his left ABI being 0.95 in the right ABI 1.01. Patient does have some pain in regard to each of these ulcerations but he tells me it's not nearly as severe as the hand wound that I help treat earlier over the summer. This was in 2018. 07/15/17 on evaluation today patient's lower surety ulcer is actually both appear to be doing fairly well at this point. He does have a little bit of skin overlapping the ulcer on the right lateral ankle this was debrided away today just with saline and  gauze without complication patient had no discomfort or pain. Otherwise patient's left foot ulcer does appear to be showing signs of [name], [name]. ([id number]) improvement he does seem to have a right great toe. Nikki and noted at this point in time. I think that this does need to be managed at this point. No fevers, chills, nausea, or vomiting noted at this time. 07/22/17 on evaluation today patient's wounds in regard to his lower extremities appear to be doing somewhat better. His right ankle wound in fact appears to likely be completely closed. The left dorsal foot ulcer though still open does appear to be a little bit smaller and does also seem to be making good progress. He has not been having problems with the treatment at this point in the general he does not seem to show as much erythema surrounding the wound bed which is good news. He is not having significant discomfort. 08/12/17 on evaluation today patients wounds of both appeared to be doing better in fact they both appear to be completely healed. Overall I'm very pleased with how things have progressed over the past week even more than what I expected. Obviously this is good  news and he is happy about this. He did have a fall when going to his orthopedic doctor recently fortunately he does not appear to have injured anything severely but nonetheless does have a couple of bumps and bruises nothing significant at this time but he needs to be seen and wound care for. He may have broken his left wrist he is following up with orthopedics in that regard. Readmission: 10/24/17 on evaluation today patient presents for reevaluation after having been discharged Aug 12, 2017. His wound was healed at that point although he states that it has been somewhat discolored since that time. He has not experienced the drainage as far as the wound is concerned but the fact that it was still discolored been greater than two months after the fact made him  want to come in for reevaluation. Upon inspection today the patient has no pain although he does have neuropathy. He does have a discolored region of the dorsal surface of the left foot which was the site where the ulcer was that we previously treated. The good news is he does not again have any discomfort although obviously I do believe this may be some scar tissue and just discoloration in that regard. Again he hasn't had any drainage or discharge since I last saw him and at this point I see no evidence of that either this area shows complete epithelialization. In general I do not feel like there's any significant issue at this point in that regard. READMISSION 02/09/2019 This is a 73 year old man with type 2 diabetes poorly controlled with significant peripheral neuropathy. He has been in this clinic before in 2018 with a laceration on his right hand and then again in 2019 with an area over the right lateral malleolus. 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 He states that the wound is been there for the last week or 2. It is been uncomfortable to walk on. The man is an active man having to look after an elderly mother. Past medical history, type 2 diabetes with polyneuropathy with a recent hemoglobin A1c in September 2011 0.3, lumbar disc disease, hypergonadism., Left foot drop and gout. We did not repeat his ABIs in the clinic quoting values from July 2019 of 0.95 and 1.01. He is not felt to have significant PAD 02/19/2019 on evaluation today patient actually appears to be doing better with regard to his foot wound based on what I am seeing currently. He was seen by Dr. Dellia Nims on readmission back into the clinic last week when I was off. The good news is the patient has been tolerating the dressing changes without any complication in fact has not really had much in the way of drainage. He does have an AFO  brace which she believes is actually pushing on this area as well and has contributed to the issue. Fortunately there is no signs of active infection at this point. His ABIs appear to be well. 03/05/2019 on evaluation today patient appears to be doing well in regard to his foot ulcer. He has been tolerating the dressing changes without complication. Fortunately there is no signs of active infection. The wound is measuring somewhat smaller and overall I feel like he is making good progress at this point. 03/12/2019 on evaluation today patient actually appears to be doing quite well with regard to his foot ulcer. The wound is looking better the measurement  is not significantly better at this point unfortunately but nonetheless again the overall appearance is improving I think he is making some progress. I still think the big issue here is foot drop and again the patient wants to see if potentially he could get a brace to help with this. Subsequently I think that we can definitely make a referral to [facility] to see if they can help [place] in this regard. He is in agreement with that plan. 03/26/2019 on evaluation today patient appears to be doing more poorly in regard to his foot ulcer at this time. He did see podiatry in order to see about getting a brace but unfortunately he tells me that Medicare is not likely to pay for one for him as [name], [name]. ([id number]) he had 1 I guess 8 months ago. However this one that apparently goes in his shoes that does not seem to be working out well for him. Unfortunately there are signs of active infection at this time. No fevers, chills, nausea, vomiting, or diarrhea. Systemically. The patient has erythema on the dorsal surface of his foot that tracks from the lateral portion of his foot and again he does have a blister around the wound as well which is worse compared to previous. Overall I am concerned. Patient History Information obtained from  Patient. Family History Cancer - Siblings, Diabetes - Father, Hypertension - Father, Kidney Disease - Mother, Stroke - Father, No family history of Heart Disease, Lung Disease, Thyroid Problems, Tuberculosis. Social History Former smoker, Marital Status - Widowed, Alcohol Use - Never, Drug Use - No History, Caffeine Use - Moderate. Medical History Eyes Patient has history of Cataracts - 2018 Ear/Nose/Mouth/Throat Patient has history of Middle ear problems Denies history of Chronic sinus problems/congestion Hematologic/Lymphatic Denies history of Anemia, Hemophilia, Human Immunodeficiency Virus, Lymphedema, Sickle Cell Disease Respiratory Patient has history of Sleep Apnea Denies history of Aspiration, Asthma, Chronic Obstructive Pulmonary Disease (COPD), Pneumothorax, Tuberculosis Cardiovascular Patient has history of Hypertension Denies history of Angina, Arrhythmia, Congestive Heart Failure, Coronary Artery Disease, Deep Vein Thrombosis, Hypotension, Myocardial Infarction, Peripheral Arterial Disease, Peripheral Venous Disease, Phlebitis, Vasculitis Gastrointestinal Denies history of Cirrhosis , Colitis, Crohn s, Hepatitis A, Hepatitis B, Hepatitis C Endocrine Patient has history of Type II Diabetes Genitourinary Denies history of End Stage Renal Disease Immunological Denies history of Lupus Erythematosus, Raynaud s, Scleroderma Musculoskeletal Patient has history of Gout - history, Osteoarthritis Denies history of Rheumatoid Arthritis, Osteomyelitis Neurologic Patient has history of Neuropathy Denies history of Dementia, Quadriplegia, Paraplegia, Seizure Disorder Oncologic Denies history of Received Chemotherapy, Received Radiation Medical And Surgical History Notes 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 Respiratory Denies complaints or symptoms of Chronic or frequent coughs, Shortness of Breath. Cardiovascular [name], [name] ([id number]) Denies complaints or symptoms of Chest pain, LE edema. Psychiatric Denies complaints or symptoms of Anxiety, Claustrophobia. Objective Constitutional Well-nourished and well-hydrated in no acute distress. Vitals Time Taken: 12:43 PM, Height: 71 in, Weight: 205 lbs, BMI: 28.6, Temperature: 97.5 F, Pulse: 68 bpm, Respiratory Rate: 16 breaths/min, Blood Pressure: 132/69 mmHg. Respiratory normal breathing without difficulty. Psychiatric this patient is able to make decisions and demonstrates good insight into disease process. Alert and Oriented x 3. pleasant and cooperative. General Notes: Wound bed actually appears to be doing significantly worse compared to last evaluation. There is erythema surrounding  and then tracking up to the lateral/dorsal surface of his foot which has me concerned in that regard. He is not having any increased pain which is good news. Nonetheless I did have to perform sharp debridement to clear away significant amount of callus as well as slough and necrotic tissue down to better subcutaneous tissue. He tolerated debridement today without complication which is good news. Integumentary (Hair, Skin) Wound #5 status is Open. Original cause of wound was Pressure Injury. The wound is located on the Left,Lateral Foot. The wound measures 0.7cm length x 0.9cm width x 0.2cm depth; 0.495cm^2 area and 0.099cm^3 volume. There is Fat Layer (Subcutaneous Tissue) Exposed exposed. There is no tunneling noted, however, there is undermining starting at 7:00 and ending at 4:00 with a maximum distance of 0.3cm. There is a medium amount of serous drainage noted. The wound margin is thickened. There is medium (34-66%) pale granulation within the wound bed. There is a medium (34-66%) amount of necrotic tissue within the  wound bed including Adherent Slough. Assessment Active Problems ICD-10 Type 2 diabetes mellitus with foot ulcer Type 2 diabetes mellitus with diabetic polyneuropathy Non-pressure chronic ulcer of other part of left foot with fat layer exposed Foot drop, left foot [name], [name]. ([id number]) Procedures Wound #5 Pre-procedure diagnosis of Wound #5 is a Diabetic Wound/Ulcer of the Lower Extremity located on the Left,Lateral Foot .Severity of Tissue Pre Debridement is: Fat layer exposed. There was a Excisional Skin/Subcutaneous Tissue Debridement with a total area of 0.63 sq cm performed by [name], Christne Platts E., [name]. With the following instrument(s): Curette, Forceps, and Scissors to remove Viable and Non-Viable tissue/material. Material removed includes Callus, Subcutaneous Tissue, and Slough after achieving pain control using Lidocaine. 1 specimen was taken by a Swab and sent to the lab per facility protocol. A time out was conducted at 13:19, prior to the start of the procedure. A Minimum amount of bleeding was controlled with Pressure. The procedure was tolerated well with a pain level of 0 throughout and a pain level of 0 following the procedure. Post Debridement Measurements: 0.7cm length x 0.9cm width x 0.2cm depth; 0.099cm^3 volume. Character of Wound/Ulcer Post Debridement is improved. Severity of Tissue Post Debridement is: Fat layer exposed. Post procedure Diagnosis Wound #5: Same as Pre-Procedure Plan Wound Cleansing: Wound #5 Left,Lateral Foot: Clean wound with Normal Saline. Cleanse wound with mild soap and water May Shower, gently pat wound dry prior to applying new dressing. Primary Wound Dressing: Wound #5 Left,Lateral Foot: Silver Alginate Secondary Dressing: Wound #5 Left,Lateral Foot: Conform/Kerlix - Rolled gauze Foam - To pad area Dressing Change Frequency: Wound #5 Left,Lateral Foot: Change dressing every other day. Follow-up Appointments: Wound #5  Left,Lateral Foot: Return Appointment in 1 week. Off-Loading: Wound #5 Left,Lateral Foot: Open toe surgical shoe with peg assist. - left foot Laboratory ordered were: Wound culture routine - Left Lateral Foot The following medication(s) was prescribed: clindamycin HCl oral 300 mg capsule 1 1 capsule oral taken 3 times a day for 15 days starting 03/26/2019 1 my suggestion at this time is good to be that we go ahead and switch to a silver alginate dressing the wound was quite macerated and I feel like we need to do something to help control moisture better. 2. Also going to suggest that we go ahead and give the patient a prescription for an antibiotic. Based on his allergies and interactions with other medications I am actually good to utilize clindamycin at this time. [name], [name] ([id number])  3. With regard to the brace for his dropfoot I am not sure exactly what is going to happen in this regard as again this is more in the realm of podiatry and I have made that referral but again were not sure whether or not Medicare is actually in a cover for anything unfortunately. We will see patient back for reevaluation in 1 week here in the clinic. If anything worsens or changes patient will contact our office for additional recommendations. Electronic Signature(s) Signed: 03/26/2019 1:33:08 PM By: Worthy Keeler [name] Entered By: Worthy Keeler on 03/26/2019 13:33:07 [name] ([id number]) -------------------------------------------------------------------------------- ROS/PFSH Details Patient Name: [name] Date of Service: 03/26/2019 12:30 PM Medical Record Number: [id number] Patient Account Number: [id number] Date of Birth/Sex: 05-21-1945 (73 y.o. M) Treating RN: Harold Barban Primary Care Provider: [name] Other Clinician: Referring Provider: [name] Treating Provider/Extender: Melburn Hake, Mansa Willers Weeks in Treatment: 6 Information Obtained  From Patient Constitutional Symptoms (General Health) Complaints and Symptoms: Negative for: Fatigue; Fever; Chills; Marked Weight Change Respiratory Complaints and Symptoms: Negative for: Chronic or frequent coughs; Shortness of Breath Medical History: Positive for: Sleep Apnea Negative for: Aspiration; Asthma; Chronic Obstructive Pulmonary Disease (COPD); Pneumothorax; Tuberculosis Cardiovascular Complaints and Symptoms: Negative for: Chest pain; LE edema Medical History: Positive for: Hypertension Negative for: Angina; Arrhythmia; Congestive Heart Failure; Coronary Artery Disease; Deep Vein Thrombosis; Hypotension; Myocardial Infarction; Peripheral Arterial Disease; Peripheral Venous Disease; Phlebitis; Vasculitis Past Medical History Notes: CVA in 2015 Psychiatric Complaints and Symptoms: Negative for: Anxiety; Claustrophobia Eyes Medical History: Positive for: Cataracts - 2018 Ear/Nose/Mouth/Throat Medical History: Positive for: Middle ear problems Negative for: Chronic sinus problems/congestion Hematologic/Lymphatic Medical History: Negative for: Anemia; Hemophilia; Human Immunodeficiency Virus; Lymphedema; Sickle Cell Disease [name], [name] ([id number]) Gastrointestinal Medical History: Negative for: Cirrhosis ; Colitis; Crohnos; Hepatitis A; Hepatitis B; Hepatitis C Endocrine Medical History: Positive for: Type II Diabetes Time with diabetes: 20 yrs Treated with: Insulin Blood sugar tested every day: Yes Tested : TID Blood sugar testing results: Bedtime: 130 Genitourinary Medical History: Negative for: End Stage Renal Disease Past Medical History Notes: "weak kidneys" - is a patient at [facility] History: Negative for: Lupus Erythematosus; Raynaudos; Scleroderma Musculoskeletal Medical History: Positive for: Gout - history; Osteoarthritis Negative for: Rheumatoid Arthritis; Osteomyelitis Neurologic Medical  History: Positive for: Neuropathy Negative for: Dementia; Quadriplegia; Paraplegia; Seizure Disorder Oncologic Medical History: Negative for: Received Chemotherapy; Received Radiation HBO Extended History Items Eyes: Ear/Nose/Mouth/Throat: Cataracts Middle ear problems Immunizations Pneumococcal Vaccine: Received Pneumococcal Vaccination: Yes Immunization Notes: up to date Implantable Devices None Family and Social History [name], [name] ([id number]) Cancer: Yes - Siblings; Diabetes: Yes - Father; Heart Disease: No; Hypertension: Yes - Father; Kidney Disease: Yes - Mother; Lung Disease: No; Stroke: Yes - Father; Thyroid Problems: No; Tuberculosis: No; Former smoker; Marital Status - Widowed; Alcohol Use: Never; Drug Use: No History; Caffeine Use: Moderate; Financial Concerns: No; Food, Clothing or Shelter Needs: No; Support System Lacking: No; Transportation Concerns: No Physician Affirmation I have reviewed and agree with the above information. Electronic Signature(s) Signed: 03/26/2019 4:05:06 PM By: Harold Barban Signed: 03/26/2019 5:30:09 PM By: Worthy Keeler [name] Entered By: Worthy Keeler on 03/26/2019 13:29:29 [name] ([id number]) -------------------------------------------------------------------------------- SuperBill Details Patient Name: [name] Date of Service: 03/26/2019 Medical Record Number: [id number] Patient Account Number: [id number] Date of Birth/Sex: 10-Aug-1945 (73 y.o. M) Treating RN: Harold Barban Primary Care Provider: [name] Other Clinician: Referring Provider: [name] Treating Provider/Extender: Melburn Hake, Ananth Fiallos  Weeks in Treatment: 6 Diagnosis Coding ICD-10 Codes Code Description E11.621 Type 2 diabetes mellitus with foot ulcer E11.42 Type 2 diabetes mellitus with diabetic polyneuropathy L97.522 Non-pressure chronic ulcer of other part of left foot with fat layer exposed M21.372 Foot drop, left  foot Facility Procedures CPT4 Code: 45625638 Description: 93734 - DEB SUBQ TISSUE 20 SQ CM/< ICD-10 Diagnosis Description L97.522 Non-pressure chronic ulcer of other part of left foot with fat Modifier: layer exposed Quantity: 1 Physician Procedures CPT4 Code: 2876811 Description: 57262 - WC PHYS LEVEL 4 - EST PT ICD-10 Diagnosis Description E11.621 Type 2 diabetes mellitus with foot ulcer E11.42 Type 2 diabetes mellitus with diabetic polyneuropathy L97.522 Non-pressure chronic ulcer of other part of left foot with  fat M21.372 Foot drop, left foot Modifier: 25 layer exposed Quantity: 1 CPT4 Code: [id number] Description: 11042 - WC PHYS SUBQ TISS 20 SQ CM ICD-10 Diagnosis Description L97.522 Non-pressure chronic ulcer of other part of left foot with fat Modifier: layer exposed Quantity: 1 Electronic Signature(s) Signed: 03/26/2019 1:33:26 PM By: Worthy Keeler [name] Entered By: Worthy Keeler on 03/26/2019 13:33:26

## 2019-03-26 NOTE — Progress Notes (Signed)
ZAIDAN, KEEBLE ([id number]) Visit Report for 03/26/2019 Arrival Information Details Patient Name: ROWAN, BLAKER Date of Service: 03/26/2019 12:30 PM Medical Record Number: [id number] Patient Account Number: [id number] Date of Birth/Sex: 04-16-1945 (73 y.o. M) Treating RN: Montey Hora Primary Care Lawton Dollinger: [name] Other Clinician: Referring Zakary Kimura: [name] Treating Khye Hochstetler/Extender: Melburn Hake, HOYT Weeks in Treatment: 6 Visit Information History Since Last Visit Added or deleted any medications: No Patient Arrived: Cane Any new allergies or adverse reactions: No Arrival Time: 12:42 Had a fall or experienced change in No Accompanied By: self activities of daily living that may affect Transfer Assistance: None risk of falls: Patient Identification Verified: Yes Signs or symptoms of abuse/neglect since last visito No Secondary Verification Process Completed: Yes Hospitalized since last visit: No Patient Has Alerts: Yes Implantable device outside of the clinic excluding No Patient Alerts: DMII cellular tissue based products placed in the center since last visit: Has Dressing in Place as Prescribed: Yes Pain Present Now: Yes Electronic Signature(s) Signed: 03/26/2019 3:19:47 PM By: Montey Hora Entered By: Montey Hora on 03/26/2019 12:43:38 [name] ([id number]) -------------------------------------------------------------------------------- Encounter Discharge Information Details Patient Name: [name] Date of Service: 03/26/2019 12:30 PM Medical Record Number: [id number] Patient Account Number: [id number] Date of Birth/Sex: [birth year] (73 y.o. M) Treating RN: Harold Barban Primary Care Corryn Madewell: [name] Other Clinician: Referring Varian Innes: [name] Treating Elvin Banker/Extender: Melburn Hake, HOYT Weeks in Treatment: 6 Encounter Discharge Information Items Post Procedure Vitals Discharge Condition:  Stable Temperature (F): 97.5 Ambulatory Status: Cane Pulse (bpm): 68 Discharge Destination: Home Respiratory Rate (breaths/min): 16 Transportation: Private Auto Blood Pressure (mmHg): 132/69 Accompanied By: self Schedule Follow-up Appointment: Yes Clinical Summary of Care: Electronic Signature(s) Signed: 03/26/2019 4:05:06 PM By: Harold Barban Entered By: Harold Barban on 03/26/2019 13:33:53 [name] ([id number]) -------------------------------------------------------------------------------- Lower Extremity Assessment Details Patient Name: [name] Date of Service: 03/26/2019 12:30 PM Medical Record Number: [id number] Patient Account Number: [id number] Date of Birth/Sex: 03-Mar-1946 (73 y.o. M) Treating RN: Montey Hora Primary Care Conor Lata: [name] Other Clinician: Referring Chazlyn Cude: [name] Treating Zaylynn Rickett/Extender: [name], HOYT Weeks in Treatment: 6 Edema Assessment Assessed: [Left: No] [Right: No] Edema: [Left: N] [Right: o] Vascular Assessment Pulses: Dorsalis Pedis Palpable: [Left:Yes] Electronic Signature(s) Signed: 03/26/2019 3:19:47 PM By: Montey Hora Entered By: Montey Hora on 03/26/2019 12:49:25 [name] ([id number]) -------------------------------------------------------------------------------- Multi Wound Chart Details Patient Name: [name] Date of Service: 03/26/2019 12:30 PM Medical Record Number: [id number] Patient Account Number: [id number] Date of Birth/Sex: 03-28-1946 (73 y.o. M) Treating RN: Harold Barban Primary Care Annalysse Shoemaker: [name] Other Clinician: Referring Yeny Schmoll: [name] Treating Andriana Casa/Extender: Melburn Hake, HOYT Weeks in Treatment: 6 Vital Signs Height(in): 71 Pulse(bpm): 68 Weight(lbs): 205 Blood Pressure(mmHg): 132/69 Body Mass Index(BMI): 29 Temperature(F): 97.5 Respiratory Rate 16 (breaths/min): Photos: [N/A:N/A] Wound Location: Left  Foot - Lateral N/A N/A Wounding Event: Pressure Injury N/A N/A Primary Etiology: Diabetic Wound/Ulcer of the N/A N/A Lower Extremity Comorbid History: Cataracts, Middle ear N/A N/A problems, Sleep Apnea, Hypertension, Type II Diabetes, Gout, Osteoarthritis, Neuropathy Date Acquired: 01/19/2019 N/A N/A Weeks of Treatment: 6 N/A N/A Wound Status: Open N/A N/A Measurements L x W x D 0.7x0.9x0.2 N/A N/A (cm) Area (cm) : 0.495 N/A N/A Volume (cm) : 0.099 N/A N/A % Reduction in Area: -80.00% N/A N/A % Reduction in Volume: -20.70% N/A N/A Starting Position 1 7 (o'clock): Ending Position 1 4 (o'clock): Maximum Distance 1 (cm): 0.3 Undermining: Yes N/A N/A Classification: Grade 2 N/A N/A  Exudate Amount: Medium N/A N/A Exudate Type: Serous N/A N/A Exudate Color: amber N/A N/A Wound Margin: Thickened N/A N/A DARIOUS, REHMAN ([id number]) Granulation Amount: Medium (34-66%) N/A N/A Granulation Quality: Pale N/A N/A Necrotic Amount: Medium (34-66%) N/A N/A Exposed Structures: Fat Layer (Subcutaneous N/A N/A Tissue) Exposed: Yes Fascia: No Tendon: No Muscle: No Joint: No Bone: No Epithelialization: None N/A N/A Treatment Notes Electronic Signature(s) Signed: 03/26/2019 4:05:06 PM By: Harold Barban Entered By: Harold Barban on 03/26/2019 13:19:24 [name] ([id number]) -------------------------------------------------------------------------------- [place] Details Patient Name: [name] Date of Service: 03/26/2019 12:30 PM Medical Record Number: [id number] Patient Account Number: [id number] Date of Birth/Sex: 05-17-1945 (73 y.o. M) Treating RN: Harold Barban Primary Care Asiya Cutbirth: [name] Other Clinician: Referring Cortana Vanderford: [name] Treating Carel Schnee/Extender: Melburn Hake, HOYT Weeks in Treatment: 6 Active Inactive Electronic Signature(s) Signed: 03/26/2019 4:05:06 PM By: Harold Barban Entered By: Harold Barban  on 03/26/2019 13:19:17 [name] ([id number]) -------------------------------------------------------------------------------- Pain Assessment Details Patient Name: [name] Date of Service: 03/26/2019 12:30 PM Medical Record Number: [id number] Patient Account Number: [id number] Date of Birth/Sex: 03/27/1946 (73 y.o. M) Treating RN: Montey Hora Primary Care Saliou Barnier: [name] Other Clinician: Referring Alanya Vukelich: [name] Treating Jatoria Kneeland/Extender: Melburn Hake, HOYT Weeks in Treatment: 6 Active Problems Location of Pain Severity and Description of Pain Patient Has Paino Yes Site Locations Pain Location: Pain in Ulcers With Dressing Change: Yes Duration of the Pain. Constant / Intermittento Constant Pain Management and Medication Current Pain Management: Electronic Signature(s) Signed: 03/26/2019 3:19:47 PM By: Montey Hora Entered By: Montey Hora on 03/26/2019 12:43:51 [name] ([id number]) -------------------------------------------------------------------------------- Patient/Caregiver Education Details Patient Name: [name] Date of Service: 03/26/2019 12:30 PM Medical Record Number: [id number] Patient Account Number: [id number] Date of Birth/Gender: 1945-11-03 (73 y.o. M) Treating RN: Harold Barban Primary Care Physician: [name] Other Clinician: Referring Physician: [name] Treating Physician/Extender: Sharalyn Ink in Treatment: 6 Education Assessment Education Provided To: Patient Education Topics Provided Infection: Handouts: Infection Prevention and Management Methods: Demonstration, Explain/Verbal Responses: State content correctly Wound/Skin Impairment: Handouts: Caring for Your Ulcer Methods: Demonstration, Explain/Verbal Responses: State content correctly Electronic Signature(s) Signed: 03/26/2019 4:05:06 PM By: Harold Barban Entered By: Harold Barban on 03/26/2019  13:27:40 [name] ([id number]) -------------------------------------------------------------------------------- Wound Assessment Details Patient Name: [name] Date of Service: 03/26/2019 12:30 PM Medical Record Number: [id number] Patient Account Number: [id number] Date of Birth/Sex: [birth year] (73 y.o. M) Treating RN: Montey Hora Primary Care Vearl Aitken: [name] Other Clinician: Referring Katryn Plummer: [name] Treating Jovon Winterhalter/Extender: [name], HOYT Weeks in Treatment: 6 Wound Status Wound Number: 5 Primary Diabetic Wound/Ulcer of the Lower Extremity Etiology: Wound Location: Left Foot - Lateral Wound Open Wounding Event: Pressure Injury Status: Date Acquired: 01/19/2019 Comorbid Cataracts, Middle ear problems, Sleep Apnea, Weeks Of Treatment: 6 History: Hypertension, Type II Diabetes, Gout, Clustered Wound: No Osteoarthritis, Neuropathy Photos Wound Measurements Length: (cm) 0.7 % Reduction i Width: (cm) 0.9 % Reduction i Depth: (cm) 0.2 Epithelializa Area: (cm) 0.495 Tunneling: Volume: (cm) 0.099 Undermining: Starting P Ending Pos Maximum Di n Area: -80% n Volume: -20.7% tion: None No Yes osition (o'clock): 7 ition (o'clock): 4 stance: (cm) 0.3 Wound Description Classification: Grade 2 Foul Odor Aft Wound Margin: Thickened Slough/Fibrin Exudate Amount: Medium Exudate Type: Serous Exudate Color: amber er Cleansing: No o Yes Wound Bed Granulation Amount: Medium (34-66%) Exposed Structure Granulation Quality: Pale Fascia Exposed: No Necrotic Amount: Medium (34-66%) Fat Layer (Subcutaneous Tissue) Exposed: Yes Necrotic Quality: Adherent  Slough Tendon Exposed: No Muscle Exposed: No Joint Exposed: No HILLMAN, ATTIG. ([id number]) Bone Exposed: No Treatment Notes Wound #5 (Left, Lateral Foot) Notes Silver alginate, Foam 2Layer , rolled gauze with peg assist shoe Electronic Signature(s) Signed: 03/26/2019 3:19:47 PM  By: Montey Hora Entered By: Montey Hora on 03/26/2019 12:48:59 [name] ([id number]) -------------------------------------------------------------------------------- [place] Details Patient Name: [name] Date of Service: 03/26/2019 12:30 PM Medical Record Number: [id number] Patient Account Number: [id number] Date of Birth/Sex: May 25, 1945 (73 y.o. M) Treating RN: Montey Hora Primary Care Jakobe Blau: [name] Other Clinician: Referring Monta Police: [name] Treating Royden Bulman/Extender: Melburn Hake, HOYT Weeks in Treatment: 6 Vital Signs Time Taken: 12:43 Temperature (F): 97.5 Height (in): 71 Pulse (bpm): 68 Weight (lbs): 205 Respiratory Rate (breaths/min): 16 Body Mass Index (BMI): 28.6 Blood Pressure (mmHg): 132/69 Reference Range: 80 - 120 mg / dl Electronic Signature(s) Signed: 03/26/2019 3:19:47 PM By: Montey Hora Entered By: Montey Hora on 03/26/2019 12:46:19

## 2019-03-27 ENCOUNTER — Other Ambulatory Visit: Payer: Self-pay

## 2019-03-27 ENCOUNTER — Ambulatory Visit: Payer: Medicare Other | Admitting: Podiatry

## 2019-03-27 ENCOUNTER — Encounter: Payer: Self-pay | Admitting: Podiatry

## 2019-03-27 DIAGNOSIS — L97521 Non-pressure chronic ulcer of other part of left foot limited to breakdown of skin: Secondary | ICD-10-CM

## 2019-03-27 DIAGNOSIS — M21372 Foot drop, left foot: Secondary | ICD-10-CM | POA: Diagnosis not present

## 2019-03-27 DIAGNOSIS — E1142 Type 2 diabetes mellitus with diabetic polyneuropathy: Secondary | ICD-10-CM | POA: Diagnosis not present

## 2019-03-27 NOTE — Progress Notes (Signed)
This patient presents to the office for an evaluation of his left foot.  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  He says he has been fitted with a brace that he was wearing.  He says that the brace caused an ulcer to form on the bottom outside of his left foot.  He says he has been going to the wound care center and they have been working on the ulcer and he says the ulcer is getting better as well as improving.  He was sen by Liliane Channel to discuss a new brace and possibly adjust his old brace.  He says he was seen by wound care yesterday who prescribed him antibiotics and rebandaged his ulcer.  Vascular  Dorsalis pedis and posterior tibial pulses are palpable  B/L.  Capillary return  WNL.  Temperature gradient is  WNL.  Skin turgor  WNL  Sensorium  Senn Weinstein monofilament wire diminished/absent.. Absent  tactile sensation.  Nail Exam  Patient has normal nails with no evidence of bacterial or fungal infection.  Orthopedic  Exam  Muscle tone and muscle strength  WNL except for dropfoot left foot/leg..  No limitations of motion feet  B/L.  No crepitus or joint effusion noted.  Foot type is unremarkable and digits show no abnormalities.  DJD 1st MPJ  B/L.    Skin ulcer noted on the plantar aspect of the left foot under the fifth meta base.  Patient has no drainage or infection noted around the ulcer.  Minimal necrotic tissue noted at the site of the ulcer left foot.   Diabetic ulcer left foot.  Dropfoot left.  Diabetes with neuropathy.  ROV.  Discussed with patient his ulcer and we are clear the ulcer is being treated by the wound care center in [place].  He was brought to [facility] for an evaluation of his old brace and possible off weightbearing for left foot ulcer.  RTC prn.   Gardiner Barefoot DPM

## 2019-03-29 LAB — AEROBIC CULTURE W GRAM STAIN (SUPERFICIAL SPECIMEN)

## 2019-04-02 ENCOUNTER — Other Ambulatory Visit: Payer: Self-pay

## 2019-04-02 ENCOUNTER — Encounter: Payer: Medicare Other | Admitting: Physician Assistant

## 2019-04-02 DIAGNOSIS — I10 Essential (primary) hypertension: Secondary | ICD-10-CM | POA: Diagnosis not present

## 2019-04-02 DIAGNOSIS — E1142 Type 2 diabetes mellitus with diabetic polyneuropathy: Secondary | ICD-10-CM | POA: Diagnosis not present

## 2019-04-02 DIAGNOSIS — S61511A Laceration without foreign body of right wrist, initial encounter: Secondary | ICD-10-CM | POA: Diagnosis not present

## 2019-04-02 DIAGNOSIS — Z8249 Family history of ischemic heart disease and other diseases of the circulatory system: Secondary | ICD-10-CM | POA: Diagnosis not present

## 2019-04-02 DIAGNOSIS — E1151 Type 2 diabetes mellitus with diabetic peripheral angiopathy without gangrene: Secondary | ICD-10-CM | POA: Diagnosis not present

## 2019-04-02 DIAGNOSIS — M1990 Unspecified osteoarthritis, unspecified site: Secondary | ICD-10-CM | POA: Diagnosis not present

## 2019-04-02 DIAGNOSIS — Z809 Family history of malignant neoplasm, unspecified: Secondary | ICD-10-CM | POA: Diagnosis not present

## 2019-04-02 DIAGNOSIS — Z794 Long term (current) use of insulin: Secondary | ICD-10-CM | POA: Diagnosis not present

## 2019-04-02 DIAGNOSIS — M109 Gout, unspecified: Secondary | ICD-10-CM | POA: Diagnosis not present

## 2019-04-02 DIAGNOSIS — E11621 Type 2 diabetes mellitus with foot ulcer: Secondary | ICD-10-CM | POA: Diagnosis not present

## 2019-04-02 DIAGNOSIS — M21372 Foot drop, left foot: Secondary | ICD-10-CM | POA: Diagnosis not present

## 2019-04-02 DIAGNOSIS — L97522 Non-pressure chronic ulcer of other part of left foot with fat layer exposed: Secondary | ICD-10-CM | POA: Diagnosis not present

## 2019-04-02 DIAGNOSIS — Z87891 Personal history of nicotine dependence: Secondary | ICD-10-CM | POA: Diagnosis not present

## 2019-04-02 DIAGNOSIS — Z833 Family history of diabetes mellitus: Secondary | ICD-10-CM | POA: Diagnosis not present

## 2019-04-02 DIAGNOSIS — Z881 Allergy status to other antibiotic agents status: Secondary | ICD-10-CM | POA: Diagnosis not present

## 2019-04-02 NOTE — Progress Notes (Addendum)
LUX, MEADERS ([id number]) Visit Report for 04/02/2019 Chief Complaint Document Details Patient Name: BLAIDEN, WERTH Date of Service: 04/02/2019 12:45 PM Medical Record Number: [id number] Patient Account Number: [id number] Date of Birth/Sex: 05/03/1945 (73 y.o. M) Treating RN: Harold Barban Primary Care Provider: [name] Other Clinician: Referring Provider: [name] Treating Provider/Extender: Melburn Hake, Kyilee Gregg Weeks in Treatment: 7 Information Obtained from: Patient Chief Complaint Left dorsal foot ulcer 02/09/2019; patient is here for review of the wound on the left lateral foot roughly at the level of the base of the fifth metatarsal Electronic Signature(s) Signed: 04/02/2019 1:06:02 PM By: Worthy Keeler [name] Entered By: Worthy Keeler on 04/02/2019 13:06:02 [name] ([id number]) -------------------------------------------------------------------------------- Debridement Details Patient Name: [name] Date of Service: 04/02/2019 12:45 PM Medical Record Number: [id number] Patient Account Number: [id number] Date of Birth/Sex: [birth year] (73 y.o. M) Treating RN: Harold Barban Primary Care Provider: [name] Other Clinician: Referring Provider: [name] Treating Provider/Extender: Melburn Hake, Saleema Weppler Weeks in Treatment: 7 Debridement Performed for Wound #5 Left,Lateral Foot Assessment: Performed By: Physician [name], Desaree Downen E., [name] Debridement Type: Debridement Severity of Tissue Pre Fat layer exposed Debridement: Level of Consciousness (Pre- Awake and Alert procedure): Pre-procedure Verification/Time Yes - 13:15 Out Taken: Start Time: 13:15 Pain Control: Lidocaine Total Area Debrided (L x W): 0.9 (cm) x 1.1 (cm) = 0.99 (cm) Tissue and other material Slough, Subcutaneous, Biofilm, Slough debrided: Level: Skin/Subcutaneous Tissue Debridement Description: Excisional Instrument: Curette Bleeding:  Minimum Hemostasis Achieved: Pressure Procedural Pain: 0 Post Procedural Pain: 0 Response to Treatment: Procedure was tolerated well Level of Consciousness Awake and Alert (Post-procedure): Post Debridement Measurements of Total Wound Length: (cm) 0.9 Width: (cm) 1.1 Depth: (cm) 0.1 Volume: (cm) 0.078 Character of Wound/Ulcer Post Debridement: Improved Severity of Tissue Post Debridement: Fat layer exposed Post Procedure Diagnosis Same as Pre-procedure Electronic Signature(s) Signed: 04/02/2019 1:23:58 PM By: Worthy Keeler [name] Signed: 04/02/2019 3:59:48 PM By: Harold Barban Entered By: Worthy Keeler on 04/02/2019 13:23:58 [name] ([id number]) -------------------------------------------------------------------------------- HPI Details Patient Name: [name] Date of Service: 04/02/2019 12:45 PM Medical Record Number: [id number] Patient Account Number: [id number] Date of Birth/Sex: 1945-06-07 (73 y.o. M) Treating RN: Harold Barban Primary Care Provider: [name] Other Clinician: Referring Provider: [name] Treating Provider/Extender: Melburn Hake, Serina Nichter Weeks in Treatment: 7 History of Present Illness HPI Description: 10/04/16 on evaluation today patient presents for initial evaluation concerning a laceration of the right wrist and hand on the bowler aspect. He tells me that he fell into glass following a medication change in regard to his diabetes. He initially went to [name] long ER where she was sutured and subsequently his daughter who is a Marine scientist removed the teachers at the appropriate time. Unfortunately the wound has done poorly and dehissed which has caused him some trouble including infection. He has been on antibiotics both topically and orally though the wound continues to show signs of necrotic tissue according to notes which is what he was sent to [place] for evaluation concerning. He does have diabetes and unfortunately this  is severely uncontrolled. He does see an endocrinologist and is working on this. 10/11/2016 -- the patient's notes from the ER were reviewed and he had his suturing done on 09/17/2016 and had necrotic skin flaps which had to be trimmed the last visit he was here. He has managed to get his Santyl ointment and has been using this appropriately. 10/21/16 patient's wound appears to be doing very well on evaluation  today. He still has some Slough covering the wound bed but this is nowhere near as severe as when i saw this initially two weeks ago nor even my review of his pictures last week. I'm pleased with how this is progressing. Patient's wound over the right form appears to be doing fairly well on evaluation today. There is no evidence of infection and he has a small area at the crease of where she is wrist extends and flexes that is still open and appears to be having difficulty due to the fact that it is cracking open. I think this is something that may be controlled with moisture control that is keeping the wound a little bit more moist. Nonetheless this is a tiny region compared to the initial wound and overall appears to be doing very well. No fevers, chills, nausea, or vomiting noted at this time. 11/25/16 on evaluation today patient appears to be doing well in regard to his right wrist ulcer. In fact this appears to be completely healed he is having no discomfort. He is pleased with how this is progressed. Readmission: 06/24/17 on evaluation today patient appears back in our clinic regarding issues that he has been having with his right ankle on the lateral malleolus as well is in the dorsal surface of his left foot. Both have been going on for several weeks unfortunately. He tells me that initially this was being managed by Dr. Edilia Bo and I did review the notes from Dr. Edilia Bo in epic. Patient was placed on amoxicillin initially and then subsequently Keflex following. He has been using  triple antibiotic ointment over-the-counter along with a Band-Aid for the wound currently. He did not have any Santyl remaining. His most recent hemoglobin A1c which was just two days ago was 10.3. Fortunately he seems to have excellent blood flow in the bilateral lower extremities with his left ABI being 0.95 in the right ABI 1.01. Patient does have some pain in regard to each of these ulcerations but he tells me it's not nearly as severe as the hand wound that I help treat earlier over the summer. This was in 2018. 07/15/17 on evaluation today patient's lower surety ulcer is actually both appear to be doing fairly well at this point. He does have a little bit of skin overlapping the ulcer on the right lateral ankle this was debrided away today just with saline and gauze without complication patient had no discomfort or pain. Otherwise patient's left foot ulcer does appear to be showing signs of improvement he does seem to have a right great toe. Nikki and noted at this point in time. I think that this does need to be managed at this point. No fevers, chills, nausea, or vomiting noted at this time. 07/22/17 on evaluation today patient's wounds in regard to his lower extremities appear to be doing somewhat better. His right ankle wound in fact appears to likely be completely closed. The left dorsal foot ulcer though still open does appear to be a little bit smaller and does also seem to be making good progress. He has not been having problems with the treatment at this point in the general he does not seem to show as much erythema surrounding the wound bed which is good news. He is not SEM, MCCAUGHEY E. ([id number]) having significant discomfort. 08/12/17 on evaluation today patients wounds of both appeared to be doing better in fact they both appear to be completely healed. Overall I'm very pleased with how things have progressed  over the past week even more than what I expected. Obviously this is good  news and he is happy about this. He did have a fall when going to his orthopedic doctor recently fortunately he does not appear to have injured anything severely but nonetheless does have a couple of bumps and bruises nothing significant at this time but he needs to be seen and wound care for. He may have broken his left wrist he is following up with orthopedics in that regard. Readmission: 10/24/17 on evaluation today patient presents for reevaluation after having been discharged Aug 12, 2017. His wound was healed at that point although he states that it has been somewhat discolored since that time. He has not experienced the drainage as far as the wound is concerned but the fact that it was still discolored been greater than two months after the fact made him want to come in for reevaluation. Upon inspection today the patient has no pain although he does have neuropathy. He does have a discolored region of the dorsal surface of the left foot which was the site where the ulcer was that we previously treated. The good news is he does not again have any discomfort although obviously I do believe this may be some scar tissue and just discoloration in that regard. Again he hasn't had any drainage or discharge since I last saw him and at this point I see no evidence of that either this area shows complete epithelialization. In general I do not feel like there's any significant issue at this point in that regard. READMISSION 02/09/2019 This is a 73 year old man with type 2 diabetes poorly controlled with significant peripheral neuropathy. He has been in this clinic before in 2018 with a laceration on his right hand and then again in 2019 with an area over the right lateral malleolus. 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 He states that the wound is been there for the last week or 2. It is been uncomfortable to walk on.  The man is an active man having to look after an elderly mother. Past medical history, type 2 diabetes with polyneuropathy with a recent hemoglobin A1c in September 2011 0.3, lumbar disc disease, hypergonadism., Left foot drop and gout. We did not repeat his ABIs in the clinic quoting values from July 2019 of 0.95 and 1.01. He is not felt to have significant PAD 02/19/2019 on evaluation today patient actually appears to be doing better with regard to his foot wound based on what I am seeing currently. He was seen by Dr. Dellia Nims on readmission back into the clinic last week when I was off. The good news is the patient has been tolerating the dressing changes without any complication in fact has not really had much in the way of drainage. He does have an AFO brace which she believes is actually pushing on this area as well and has contributed to the issue. Fortunately there is no signs of active infection at this point. His ABIs appear to be well. 03/05/2019 on evaluation today patient appears to be doing well in regard to his foot ulcer. He has been tolerating the dressing changes without complication. Fortunately there is no signs of active infection. The wound is measuring somewhat smaller and overall I feel like he is making good progress at this point. 03/12/2019 on evaluation today patient actually appears to be doing quite  well with regard to his foot ulcer. The wound is looking better the measurement is not significantly better at this point unfortunately but nonetheless again the overall appearance is improving I think he is making some progress. I still think the big issue here is foot drop and again the patient wants to see if potentially he could get a brace to help with this. Subsequently I think that we can definitely make a referral to [facility] to see if they can help [place] in this regard. He is in agreement with that plan. 03/26/2019 on evaluation today patient appears to be doing more  poorly in regard to his foot ulcer at this time. He did see podiatry in order to see about getting a brace but unfortunately he tells me that Medicare is not likely to pay for one for him as he had 1 I guess 8 months ago. However this one that apparently goes in his shoes that does not seem to be working out well for him. Unfortunately there are signs of active infection at this time. No fevers, chills, nausea, vomiting, or diarrhea. Systemically. The patient has erythema on the dorsal surface of his foot that tracks from the lateral portion of his foot and again he does have a blister around the wound as well which is worse compared to previous. Overall I am concerned. 04/02/2019 on evaluation today patient appears to be doing about the same with regard to his wound. Unfortunately there is no signs of significant improvement although I do think there is no signs of infection which is good news. His drainage is also TARL, CEPHAS E. ([id number]) slowed down tremendously. I think at this point he would benefit from the initiation of a total contact cast. The only concern I have is that over his balance. I explained to the patient that if we were to initiate the cast I think it would be greatly beneficial for him but he would need to ensure not to walk at all unless he uses a walker he actually has 2 walkers one for inside his home and one that he keeps out in the car. For that reason he would be covered in that regard. I just do not want him to have any accidents and fall with the cast. Electronic Signature(s) Signed: 04/02/2019 1:21:27 PM By: Worthy Keeler [name] Entered By: Worthy Keeler on 04/02/2019 13:21:26 [name] ([id number]) -------------------------------------------------------------------------------- Physical Exam Details Patient Name: [name] Date of Service: 04/02/2019 12:45 PM Medical Record Number: [id number] Patient Account Number: [id number] Date of  Birth/Sex: [birth year] (73 y.o. M) Treating RN: Harold Barban Primary Care Provider: [name] Other Clinician: Referring Provider: [name] Treating Provider/Extender: [name], Kellin Fifer Weeks in Treatment: 7 Constitutional Well-nourished and well-hydrated in no acute distress. Respiratory normal breathing without difficulty. Psychiatric this patient is able to make decisions and demonstrates good insight into disease process. Alert and Oriented x 3. pleasant and cooperative. Notes Patient's wound bed currently did require sharp debridement to clear away some necrotic tissue from the surface of the wound and he actually tolerated that today without complication. Post debridement wound bed appears to be doing much better which is great news. Fortunately there is no signs of active infection at this time. No fever chills no Electronic Signature(s) Signed: 04/02/2019 1:22:06 PM By: Worthy Keeler [name] Entered By: Worthy Keeler on 04/02/2019 13:22:06 [name] ([id number]) -------------------------------------------------------------------------------- Physician Orders Details Patient Name: [name] Date of Service: 04/02/2019 12:45  PM Medical Record Number: [id number] Patient Account Number: [id number] Date of Birth/Sex: 1946/03/19 (73 y.o. M) Treating RN: Harold Barban Primary Care Provider: [name] Other Clinician: Referring Provider: [name] Treating Provider/Extender: Melburn Hake, Armonee Bojanowski Weeks in Treatment: 7 Verbal / Phone Orders: No Diagnosis Coding ICD-10 Coding Code Description E11.621 Type 2 diabetes mellitus with foot ulcer E11.42 Type 2 diabetes mellitus with diabetic polyneuropathy L97.522 Non-pressure chronic ulcer of other part of left foot with fat layer exposed M21.372 Foot drop, left foot Wound Cleansing Wound #5 Left,Lateral Foot o May shower with protection. Primary Wound Dressing Wound #5 Left,Lateral  Foot o Silver Collagen Secondary Dressing Wound #5 Left,Lateral Foot o Conform/Kerlix - Rolled gauze o Foam - To pad area Follow-up Appointments Wound #5 Left,Lateral Foot o Return Appointment in 1 week. - Please return Thursday, December 24th Off-Loading Wound #5 Left,Lateral Foot o Total Contact Cast to Left Lower Extremity Electronic Signature(s) Signed: 04/02/2019 3:59:48 PM By: Harold Barban Signed: 04/02/2019 6:06:28 PM By: Worthy Keeler [name] Entered By: Harold Barban on 04/02/2019 13:20:03 [name] ([id number]) -------------------------------------------------------------------------------- Problem List Details Patient Name: [name] Date of Service: 04/02/2019 12:45 PM Medical Record Number: [id number] Patient Account Number: [id number] Date of Birth/Sex: 06-Feb-1946 (73 y.o. M) Treating RN: Harold Barban Primary Care Provider: [name] Other Clinician: Referring Provider: [name] Treating Provider/Extender: Melburn Hake, Teale Goodgame Weeks in Treatment: 7 Active Problems ICD-10 Evaluated Encounter Code Description Active Date Today Diagnosis E11.621 Type 2 diabetes mellitus with foot ulcer 02/09/2019 No Yes E11.42 Type 2 diabetes mellitus with diabetic polyneuropathy 02/09/2019 No Yes L97.522 Non-pressure chronic ulcer of other part of left foot with fat 02/09/2019 No Yes layer exposed M21.372 Foot drop, left foot 02/09/2019 No Yes Inactive Problems Resolved Problems Electronic Signature(s) Signed: 04/02/2019 1:05:55 PM By: Worthy Keeler [name] Entered By: Worthy Keeler on 04/02/2019 13:05:54 [name] ([id number]) -------------------------------------------------------------------------------- Progress Note Details Patient Name: [name] Date of Service: 04/02/2019 12:45 PM Medical Record Number: [id number] Patient Account Number: [id number] Date of Birth/Sex: 07-Nov-1945 (73 y.o. M) Treating RN: Harold Barban Primary Care Provider: [name] Other Clinician: Referring Provider: [name] Treating Provider/Extender: Melburn Hake, Shontay Wallner Weeks in Treatment: 7 Subjective Chief Complaint Information obtained from Patient Left dorsal foot ulcer 02/09/2019; patient is here for review of the wound on the left lateral foot roughly at the level of the base of the fifth metatarsal History of Present Illness (HPI) 10/04/16 on evaluation today patient presents for initial evaluation concerning a laceration of the right wrist and hand on the bowler aspect. He tells me that he fell into glass following a medication change in regard to his diabetes. He initially went to [facility] ER where she was sutured and subsequently his daughter who is a Marine scientist removed the teachers at the appropriate time. Unfortunately the wound has done poorly and dehissed which has caused him some trouble including infection. He has been on antibiotics both topically and orally though the wound continues to show signs of necrotic tissue according to notes which is what he was sent to [place] for evaluation concerning. He does have diabetes and unfortunately this is severely uncontrolled. He does see an endocrinologist and is working on this. 10/11/2016 -- the patient's notes from the ER were reviewed and he had his suturing done on 09/17/2016 and had necrotic skin flaps which had to be trimmed the last visit he was here. He has managed to get his Santyl ointment and has been using  this appropriately. 10/21/16 patient's wound appears to be doing very well on evaluation today. He still has some Slough covering the wound bed but this is nowhere near as severe as when i saw this initially two weeks ago nor even my review of his pictures last week. I'm pleased with how this is progressing. Patient's wound over the right form appears to be doing fairly well on evaluation today. There is no evidence of infection and he has a small  area at the crease of where she is wrist extends and flexes that is still open and appears to be having difficulty due to the fact that it is cracking open. I think this is something that may be controlled with moisture control that is keeping the wound a little bit more moist. Nonetheless this is a tiny region compared to the initial wound and overall appears to be doing very well. No fevers, chills, nausea, or vomiting noted at this time. 11/25/16 on evaluation today patient appears to be doing well in regard to his right wrist ulcer. In fact this appears to be completely healed he is having no discomfort. He is pleased with how this is progressed. Readmission: 06/24/17 on evaluation today patient appears back in our clinic regarding issues that he has been having with his right ankle on the lateral malleolus as well is in the dorsal surface of his left foot. Both have been going on for several weeks unfortunately. He tells me that initially this was being managed by Dr. Edilia Bo and I did review the notes from Dr. Edilia Bo in epic. Patient was placed on amoxicillin initially and then subsequently Keflex following. He has been using triple antibiotic ointment over-the-counter along with a Band-Aid for the wound currently. He did not have any Santyl remaining. His most recent hemoglobin A1c which was just two days ago was 10.3. Fortunately he seems to have excellent blood flow in the bilateral lower extremities with his left ABI being 0.95 in the right ABI 1.01. Patient does have some pain in regard to each of these ulcerations but he tells me it's not nearly as severe as the hand wound that I help treat earlier over the summer. This was in 2018. 07/15/17 on evaluation today patient's lower surety ulcer is actually both appear to be doing fairly well at this point. He does have a little bit of skin overlapping the ulcer on the right lateral ankle this was debrided away today just with saline and  gauze without complication patient had no discomfort or pain. Otherwise patient's left foot ulcer does appear to be showing signs of DAMARCO, KEYSOR. ([id number]) improvement he does seem to have a right great toe. Nikki and noted at this point in time. I think that this does need to be managed at this point. No fevers, chills, nausea, or vomiting noted at this time. 07/22/17 on evaluation today patient's wounds in regard to his lower extremities appear to be doing somewhat better. His right ankle wound in fact appears to likely be completely closed. The left dorsal foot ulcer though still open does appear to be a little bit smaller and does also seem to be making good progress. He has not been having problems with the treatment at this point in the general he does not seem to show as much erythema surrounding the wound bed which is good news. He is not having significant discomfort. 08/12/17 on evaluation today patients wounds of both appeared to be doing better in fact they both  appear to be completely healed. Overall I'm very pleased with how things have progressed over the past week even more than what I expected. Obviously this is good news and he is happy about this. He did have a fall when going to his orthopedic doctor recently fortunately he does not appear to have injured anything severely but nonetheless does have a couple of bumps and bruises nothing significant at this time but he needs to be seen and wound care for. He may have broken his left wrist he is following up with orthopedics in that regard. Readmission: 10/24/17 on evaluation today patient presents for reevaluation after having been discharged Aug 12, 2017. His wound was healed at that point although he states that it has been somewhat discolored since that time. He has not experienced the drainage as far as the wound is concerned but the fact that it was still discolored been greater than two months after the fact made him  want to come in for reevaluation. Upon inspection today the patient has no pain although he does have neuropathy. He does have a discolored region of the dorsal surface of the left foot which was the site where the ulcer was that we previously treated. The good news is he does not again have any discomfort although obviously I do believe this may be some scar tissue and just discoloration in that regard. Again he hasn't had any drainage or discharge since I last saw him and at this point I see no evidence of that either this area shows complete epithelialization. In general I do not feel like there's any significant issue at this point in that regard. READMISSION 02/09/2019 This is a 73 year old man with type 2 diabetes poorly controlled with significant peripheral neuropathy. He has been in this clinic before in 2018 with a laceration on his right hand and then again in 2019 with an area over the right lateral malleolus. 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 He states that the wound is been there for the last week or 2. It is been uncomfortable to walk on. The man is an active man having to look after an elderly mother. Past medical history, type 2 diabetes with polyneuropathy with a recent hemoglobin A1c in September 2011 0.3, lumbar disc disease, hypergonadism., Left foot drop and gout. We did not repeat his ABIs in the clinic quoting values from July 2019 of 0.95 and 1.01. He is not felt to have significant PAD 02/19/2019 on evaluation today patient actually appears to be doing better with regard to his foot wound based on what I am seeing currently. He was seen by Dr. Dellia Nims on readmission back into the clinic last week when I was off. The good news is the patient has been tolerating the dressing changes without any complication in fact has not really had much in the way of drainage. He does have an AFO  brace which she believes is actually pushing on this area as well and has contributed to the issue. Fortunately there is no signs of active infection at this point. His ABIs appear to be well. 03/05/2019 on evaluation today patient appears to be doing well in regard to his foot ulcer. He has been tolerating the dressing changes without complication. Fortunately there is no signs of active infection. The wound is measuring somewhat smaller and overall I feel like he is making good progress  at this point. 03/12/2019 on evaluation today patient actually appears to be doing quite well with regard to his foot ulcer. The wound is looking better the measurement is not significantly better at this point unfortunately but nonetheless again the overall appearance is improving I think he is making some progress. I still think the big issue here is foot drop and again the patient wants to see if potentially he could get a brace to help with this. Subsequently I think that we can definitely make a referral to [facility] to see if they can help [place] in this regard. He is in agreement with that plan. 03/26/2019 on evaluation today patient appears to be doing more poorly in regard to his foot ulcer at this time. He did see podiatry in order to see about getting a brace but unfortunately he tells me that Medicare is not likely to pay for one for him as JAVAN, GONZAGA. ([id number]) he had 1 I guess 8 months ago. However this one that apparently goes in his shoes that does not seem to be working out well for him. Unfortunately there are signs of active infection at this time. No fevers, chills, nausea, vomiting, or diarrhea. Systemically. The patient has erythema on the dorsal surface of his foot that tracks from the lateral portion of his foot and again he does have a blister around the wound as well which is worse compared to previous. Overall I am concerned. 04/02/2019 on evaluation today patient appears to be doing  about the same with regard to his wound. Unfortunately there is no signs of significant improvement although I do think there is no signs of infection which is good news. His drainage is also slowed down tremendously. I think at this point he would benefit from the initiation of a total contact cast. The only concern I have is that over his balance. I explained to the patient that if we were to initiate the cast I think it would be greatly beneficial for him but he would need to ensure not to walk at all unless he uses a walker he actually has 2 walkers one for inside his home and one that he keeps out in the car. For that reason he would be covered in that regard. I just do not want him to have any accidents and fall with the cast. Patient History Information obtained from Patient. Family History Cancer - Siblings, Diabetes - Father, Hypertension - Father, Kidney Disease - Mother, Stroke - Father, No family history of Heart Disease, Lung Disease, Thyroid Problems, Tuberculosis. Social History Former smoker, Marital Status - Widowed, Alcohol Use - Never, Drug Use - No History, Caffeine Use - Moderate. Medical History Eyes Patient has history of Cataracts - 2018 Ear/Nose/Mouth/Throat Patient has history of Middle ear problems Denies history of Chronic sinus problems/congestion Hematologic/Lymphatic Denies history of Anemia, Hemophilia, Human Immunodeficiency Virus, Lymphedema, Sickle Cell Disease Respiratory Patient has history of Sleep Apnea Denies history of Aspiration, Asthma, Chronic Obstructive Pulmonary Disease (COPD), Pneumothorax, Tuberculosis Cardiovascular Patient has history of Hypertension Denies history of Angina, Arrhythmia, Congestive Heart Failure, Coronary Artery Disease, Deep Vein Thrombosis, Hypotension, Myocardial Infarction, Peripheral Arterial Disease, Peripheral Venous Disease, Phlebitis, Vasculitis Gastrointestinal Denies history of Cirrhosis , Colitis, Crohn s,  Hepatitis A, Hepatitis B, Hepatitis C Endocrine Patient has history of Type II Diabetes Genitourinary Denies history of End Stage Renal Disease Immunological Denies history of Lupus Erythematosus, Raynaud s, Scleroderma Musculoskeletal Patient has history of Gout - history, Osteoarthritis Denies history  of Rheumatoid Arthritis, Osteomyelitis Neurologic Patient has history of Neuropathy Denies history of Dementia, Quadriplegia, Paraplegia, Seizure Disorder Oncologic Denies history of Received Chemotherapy, Received Radiation Medical And Surgical History Notes Cardiovascular CVA in 2015 [place], [place]. ([id number]) "weak kidneys" - is a patient at [place] ([facility]) Constitutional Symptoms ([place]) Denies complaints or symptoms of Fatigue, Fever, Chills, Marked Weight Change. Respiratory Denies complaints or symptoms of Chronic or frequent coughs, Shortness of Breath. Cardiovascular Denies complaints or symptoms of Chest pain, LE edema. Psychiatric Denies complaints or symptoms of Anxiety, Claustrophobia. Objective Constitutional Well-nourished and well-hydrated in no acute distress. Vitals Time Taken: 1:00 PM, Height: 71 in, Weight: 205 lbs, BMI: 28.6, Temperature: 98.8 F, Pulse: 119 bpm, Respiratory Rate: 16 breaths/min, Blood Pressure: 162/76 mmHg. Respiratory normal breathing without difficulty. Psychiatric this patient is able to make decisions and demonstrates good insight into disease process. Alert and Oriented x 3. pleasant and cooperative. General Notes: Patient's wound bed currently did require sharp debridement to clear away some necrotic tissue from the surface of the wound and he actually tolerated that today without complication. Post debridement wound bed appears to be doing much better which is great news. Fortunately there is no signs of active infection at this time. No fever chills no Integumentary (Hair,  Skin) Wound #5 status is Open. Original cause of wound was Pressure Injury. The wound is located on the Left,Lateral Foot. The wound measures 0.9cm length x 1.1cm width x 0.1cm depth; 0.778cm^2 area and 0.078cm^3 volume. There is Fat Layer (Subcutaneous Tissue) Exposed exposed. There is no tunneling or undermining noted. There is a medium amount of serous drainage noted. The wound margin is thickened. There is medium (34-66%) pale granulation within the wound bed. There is a medium (34-66%) amount of necrotic tissue within the wound bed including Adherent Slough. 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 ([id number]) Foot drop, left foot Procedures Wound #5 Pre-procedure diagnosis of Wound #5 is a Diabetic Wound/Ulcer of the Lower Extremity located on the Left,Lateral Foot .Severity of Tissue Pre Debridement is: Fat layer exposed. There was a Excisional Skin/Subcutaneous Tissue Debridement with a total area of 0.99 sq cm performed by [name], Tell Rozelle E., [name]. With the following instrument(s): Curette Material removed includes Subcutaneous Tissue, Slough, and Biofilm after achieving pain control using Lidocaine. No specimens were taken. A time out was conducted at 13:15, prior to the start of the procedure. A Minimum amount of bleeding was controlled with Pressure. The procedure was tolerated well with a pain level of 0 throughout and a pain level of 0 following the procedure. Post Debridement Measurements: 0.9cm length x 1.1cm width x 0.1cm depth; 0.078cm^3 volume. Character of Wound/Ulcer Post Debridement is improved. Severity of Tissue Post Debridement is: Fat layer exposed. Post procedure Diagnosis Wound #5: Same as Pre-Procedure Plan Wound Cleansing: Wound #5 Left,Lateral Foot: May shower with  protection. Primary Wound Dressing: Wound #5 Left,Lateral Foot: Silver Collagen Secondary Dressing: Wound #5 Left,Lateral Foot: Conform/Kerlix - Rolled gauze Foam - To pad area Follow-up Appointments: Wound #5 Left,Lateral Foot: Return Appointment in 1 week. - Please return Thursday, December 24th Off-Loading: Wound #5 Left,Lateral Foot: Total Contact Cast to Left Lower Extremity 1. My suggestion at this time is good to be that we go ahead and initiate a continuation of the silver collagen that  we were using before wound became infected and the patient is in agreement with that plan. 2. We will cover this area with foam for padding and then were going to apply the total contact cast today which I think will be the best option for him for offloading going forward. With regard to his balance he does have a cane he also has 2 walkers which we discussed as well I would want him to use the walker's anytime he is getting around during the time that he has on the cast to ensure that he does not have any on expected falling. Also I do want to see him back obviously before the holiday in order to ensure that everything is okay and that he is tolerating the cast without any complication. If he seems to be doing well in that regard then we can reapply and continue as such. 3. With regard to the cast also explained that he always needs to have the boot part of the cast on as well he cannot walk on the cast without the boot without risking damaging the cast which can possibly cause problems. ZALMAN, HULL ([id number]) We will see patient back for reevaluation in 1 week here in the clinic. If anything worsens or changes patient will contact our office for additional recommendations.I will actually be seeing him in 3 days to change out the cast and ensure he has no problems. Electronic Signature(s) Signed: 04/02/2019 1:24:11 PM By: Worthy Keeler [name] Previous Signature: 04/02/2019 1:23:26 PM  Version By: Worthy Keeler [name] Entered By: Worthy Keeler on 04/02/2019 13:24:11 [name] ([id number]) -------------------------------------------------------------------------------- [facility] Details Patient Name: [name] Date of Service: 04/02/2019 12:45 PM Medical Record Number: [id number] Patient Account Number: [id number] Date of Birth/Sex: Jun 18, 1945 (73 y.o. M) Treating RN: Harold Barban Primary Care Provider: [name] Other Clinician: Referring Provider: [name] Treating Provider/Extender: Melburn Hake, Yari Szeliga Weeks in Treatment: 7 Information Obtained From Patient Constitutional Symptoms (General Health) Complaints and Symptoms: Negative for: Fatigue; Fever; Chills; Marked Weight Change Respiratory Complaints and Symptoms: Negative for: Chronic or frequent coughs; Shortness of Breath Medical History: Positive for: Sleep Apnea Negative for: Aspiration; Asthma; Chronic Obstructive Pulmonary Disease (COPD); Pneumothorax; Tuberculosis Cardiovascular Complaints and Symptoms: Negative for: Chest pain; LE edema Medical History: Positive for: Hypertension Negative for: Angina; Arrhythmia; Congestive Heart Failure; Coronary Artery Disease; Deep Vein Thrombosis; Hypotension; Myocardial Infarction; Peripheral Arterial Disease; Peripheral Venous Disease; Phlebitis; Vasculitis Past Medical History Notes: CVA in 2015 Psychiatric Complaints and Symptoms: Negative for: Anxiety; Claustrophobia Eyes Medical History: Positive for: Cataracts - 2018 Ear/Nose/Mouth/Throat Medical History: Positive for: Middle ear problems Negative for: Chronic sinus problems/congestion Hematologic/Lymphatic Medical History: Negative for: Anemia; Hemophilia; Human Immunodeficiency Virus; Lymphedema; Sickle Cell Disease YAHEL, FUSTON ([id number]) Gastrointestinal Medical History: Negative for: Cirrhosis ; Colitis; Crohnos; Hepatitis A; Hepatitis B; Hepatitis  C Endocrine Medical History: Positive for: Type II Diabetes Time with diabetes: 20 yrs Treated with: Insulin Blood sugar tested every day: Yes Tested : TID Blood sugar testing results: Bedtime: 130 Genitourinary Medical History: Negative for: End Stage Renal Disease Past Medical History Notes: "weak kidneys" - is a patient at [facility] History: Negative for: Lupus Erythematosus; Raynaudos; Scleroderma Musculoskeletal Medical History: Positive for: Gout - history; Osteoarthritis Negative for: Rheumatoid Arthritis; Osteomyelitis Neurologic Medical History: Positive for: Neuropathy Negative for: Dementia; Quadriplegia; Paraplegia; Seizure Disorder Oncologic Medical History: Negative for: Received Chemotherapy; Received Radiation HBO Extended History Items Eyes: Ear/Nose/Mouth/Throat: Cataracts Middle ear problems Immunizations Pneumococcal  Vaccine: Received Pneumococcal Vaccination: Yes Immunization Notes: up to date Implantable Devices None Family and Social History HENRRY, FEIL ([id number]) Cancer: Yes - Siblings; Diabetes: Yes - Father; Heart Disease: No; Hypertension: Yes - Father; Kidney Disease: Yes - Mother; Lung Disease: No; Stroke: Yes - Father; Thyroid Problems: No; Tuberculosis: No; Former smoker; Marital Status - Widowed; Alcohol Use: Never; Drug Use: No History; Caffeine Use: Moderate; Financial Concerns: No; Food, Clothing or Shelter Needs: No; Support System Lacking: No; Transportation Concerns: No Physician Affirmation I have reviewed and agree with the above information. Electronic Signature(s) Signed: 04/02/2019 3:59:48 PM By: Harold Barban Signed: 04/02/2019 6:06:28 PM By: Worthy Keeler [name] Entered By: Worthy Keeler on 04/02/2019 13:21:49 [name] ([id number]) -------------------------------------------------------------------------------- [place] Details Patient Name: [name] Date of  Service: 04/02/2019 Medical Record Number: [id number] Patient Account Number: [id number] Date of Birth/Sex: 01-23-1946 (73 y.o. M) Treating RN: Harold Barban Primary Care Provider: [name] Other Clinician: Referring Provider: [name] Treating Provider/Extender: Melburn Hake, [address] Weeks in Treatment: 7 Diagnosis Coding ICD-10 Codes Code Description E11.621 Type 2 diabetes mellitus with foot ulcer E11.42 Type 2 diabetes mellitus with diabetic polyneuropathy L97.522 Non-pressure chronic ulcer of other part of left foot with fat layer exposed M21.372 Foot drop, left foot Facility Procedures CPT4 Code: [id number] Description: 03833 - DEB SUBQ TISSUE 20 SQ CM/< ICD-10 Diagnosis Description L97.522 Non-pressure chronic ulcer of other part of left foot with fat Modifier: layer exposed Quantity: 1 Physician Procedures CPT4 Code: [id number] Description: 11042 - WC PHYS SUBQ TISS 20 SQ CM ICD-10 Diagnosis Description L97.522 Non-pressure chronic ulcer of other part of left foot with fat Modifier: layer exposed Quantity: 1 Electronic Signature(s) Signed: 04/02/2019 1:24:22 PM By: Worthy Keeler [name] Entered By: Worthy Keeler on 04/02/2019 13:24:22

## 2019-04-02 NOTE — Progress Notes (Signed)
[name] ([id number]) Visit Report for 04/02/2019 Arrival Information Details Patient Name: [name] Date of Service: 04/02/2019 12:45 PM Medical Record Number: [id number] Patient Account Number: [id number] Date of Birth/Sex: Oct 05, 1945 (73 y.o. M) Treating RN: Harold Barban Primary Care Sami Froh: [name] Other Clinician: Referring Devante Capano: [name] Treating Tamecka Milham/Extender: Melburn Hake, HOYT Weeks in Treatment: 7 Visit Information History Since Last Visit Added or deleted any medications: No Patient Arrived: Cane Any new allergies or adverse reactions: No Arrival Time: 12:59 Had a fall or experienced change in No Accompanied By: self activities of daily living that may affect Transfer Assistance: None risk of falls: Patient Identification Verified: Yes Hospitalized since last visit: No Secondary Verification Process Completed: Yes Implantable device outside of the clinic excluding No Patient Has Alerts: Yes cellular tissue based products placed in the center Patient Alerts: DMII since last visit: Has Dressing in Place as Prescribed: Yes Pain Present Now: Yes Electronic Signature(s) Signed: 04/02/2019 2:03:17 PM By: Lorine Bears RCP, RRT, CHT Entered By: Lorine Bears on 04/02/2019 13:00:24 [name] ([id number]) -------------------------------------------------------------------------------- Encounter Discharge Information Details Patient Name: [name] Date of Service: 04/02/2019 12:45 PM Medical Record Number: [id number] Patient Account Number: [id number] Date of Birth/Sex: [birth year] (73 y.o. M) Treating RN: Harold Barban Primary Care Raia Amico: [name] Other Clinician: Referring Richa Shor: [name] Treating Male Iafrate/Extender: Melburn Hake, HOYT Weeks in Treatment: 7 Encounter Discharge Information Items Post Procedure Vitals Discharge Condition: Stable Temperature (F):  98.8 Ambulatory Status: Cane Pulse (bpm): 119 Discharge Destination: Home Respiratory Rate (breaths/min): 16 Transportation: Private Auto Blood Pressure (mmHg): 162/76 Accompanied By: self Schedule Follow-up Appointment: Yes Clinical Summary of Care: Patient Declined Electronic Signature(s) Signed: 04/02/2019 3:59:48 PM By: Harold Barban Entered By: Harold Barban on 04/02/2019 13:22:14 [name] ([id number]) -------------------------------------------------------------------------------- Lower Extremity Assessment Details Patient Name: [name] Date of Service: 04/02/2019 12:45 PM Medical Record Number: [id number] Patient Account Number: [id number] Date of Birth/Sex: 10-04-1945 (73 y.o. M) Treating RN: Montey Hora Primary Care Adiyah Lame: [name] Other Clinician: Referring Kaya Klausing: [name] Treating Jordi Kamm/Extender: [name], HOYT Weeks in Treatment: 7 Edema Assessment Assessed: [Left: No] [Right: No] Edema: [Left: N] [Right: o] Vascular Assessment Pulses: Dorsalis Pedis Palpable: [Left:Yes] Electronic Signature(s) Signed: 04/02/2019 4:13:29 PM By: Montey Hora Entered By: Montey Hora on 04/02/2019 13:03:50 [name] ([id number]) -------------------------------------------------------------------------------- Multi Wound Chart Details Patient Name: [name] Date of Service: 04/02/2019 12:45 PM Medical Record Number: [id number] Patient Account Number: [id number] Date of Birth/Sex: 1945-08-16 (73 y.o. M) Treating RN: Harold Barban Primary Care Keyari Kleeman: [name] Other Clinician: Referring Daneil Beem: [name] Treating Vanette Noguchi/Extender: [name], HOYT Weeks in Treatment: 7 Vital Signs Height(in): 71 Pulse(bpm): 119 Weight(lbs): 205 Blood Pressure(mmHg): 162/76 Body Mass Index(BMI): 29 Temperature(F): 98.8 Respiratory Rate 16 (breaths/min): Photos: [N/A:N/A] Wound Location: Left Foot -  Lateral N/A N/A Wounding Event: Pressure Injury N/A N/A Primary Etiology: Diabetic Wound/Ulcer of the N/A N/A Lower Extremity Comorbid History: Cataracts, Middle ear N/A N/A problems, Sleep Apnea, Hypertension, Type II Diabetes, Gout, Osteoarthritis, Neuropathy Date Acquired: 01/19/2019 N/A N/A Weeks of Treatment: 7 N/A N/A Wound Status: Open N/A N/A Measurements L x W x D 0.9x1.1x0.1 N/A N/A (cm) Area (cm) : 0.778 N/A N/A Volume (cm) : 0.078 N/A N/A % Reduction in Area: -182.90% N/A N/A % Reduction in Volume: 4.90% N/A N/A Classification: Grade 2 N/A N/A Exudate Amount: Medium N/A N/A Exudate Type: Serous N/A N/A Exudate Color: amber N/A N/A Wound Margin: Thickened N/A N/A Granulation  Amount: Medium (34-66%) N/A N/A Granulation Quality: Pale N/A N/A Necrotic Amount: Medium (34-66%) N/A N/A Exposed Structures: Fat Layer (Subcutaneous N/A N/A Tissue) Exposed: Yes Fascia: No [name], Dustin E. ([id number]) Tendon: No Muscle: No Joint: No Bone: No Epithelialization: None N/A N/A Treatment Notes Electronic Signature(s) Signed: 04/02/2019 3:59:48 PM By: Harold Barban Entered By: Harold Barban on 04/02/2019 13:09:02 [name] ([id number]) -------------------------------------------------------------------------------- [place] Details Patient Name: [name] Date of Service: 04/02/2019 12:45 PM Medical Record Number: [id number] Patient Account Number: [id number] Date of Birth/Sex: 1945-11-10 (73 y.o. M) Treating RN: Harold Barban Primary Care Derryck Shahan: [name] Other Clinician: Referring Marce Charlesworth: [name] Treating Jkayla Spiewak/Extender: Melburn Hake, HOYT Weeks in Treatment: 7 Active Inactive Electronic Signature(s) Signed: 04/02/2019 3:59:48 PM By: Harold Barban Entered By: Harold Barban on 04/02/2019 [place], [place].  ([id number]) -------------------------------------------------------------------------------- Pain Assessment Details Patient Name: [name] Date of Service: 04/02/2019 12:45 PM Medical Record Number: [id number] Patient Account Number: [id number] Date of Birth/Sex: 1946/02/27 (73 y.o. M) Treating RN: Harold Barban Primary Care Madailein Londo: [name] Other Clinician: Referring Prithvi Kooi: [name] Treating Makeda Peeks/Extender: Melburn Hake, HOYT Weeks in Treatment: 7 Active Problems Location of Pain Severity and Description of Pain Patient Has Paino Yes Site Locations Pain Management and Medication Current Pain Management: Electronic Signature(s) Signed: 04/02/2019 2:03:17 PM By: Lorine Bears RCP, RRT, CHT Signed: 04/02/2019 3:59:48 PM By: Harold Barban Entered By: Lorine Bears on 04/02/2019 13:00:33 [name] ([id number]) -------------------------------------------------------------------------------- Patient/Caregiver Education Details Patient Name: [name] Date of Service: 04/02/2019 12:45 PM Medical Record Number: [id number] Patient Account Number: [id number] Date of Birth/Gender: [birth year] (73 y.o. M) Treating RN: Harold Barban Primary Care Physician: [name] Other Clinician: Referring Physician: [name] Treating Physician/Extender: Sharalyn Ink in Treatment: 7 Education Assessment Education Provided To: Patient Education Topics Provided Pressure: Handouts: Pressure Ulcers: Care and Offloading Methods: Demonstration, Explain/Verbal Responses: State content correctly Wound/Skin Impairment: Handouts: Caring for Your Ulcer Methods: Demonstration, Explain/Verbal Responses: State content correctly Electronic Signature(s) Signed: 04/02/2019 3:59:48 PM By: Harold Barban Entered By: Harold Barban on 04/02/2019 13:09:33 [name]  ([id number]) -------------------------------------------------------------------------------- Wound Assessment Details Patient Name: [name] Date of Service: 04/02/2019 12:45 PM Medical Record Number: [id number] Patient Account Number: [id number] Date of Birth/Sex: [birth year] (73 y.o. M) Treating RN: Montey Hora Primary Care Juliana Boling: [name] Other Clinician: Referring Kylie Gros: [name] Treating Mariama Saintvil/Extender: [name], HOYT Weeks in Treatment: 7 Wound Status Wound Number: 5 Primary Diabetic Wound/Ulcer of the Lower Extremity Etiology: Wound Location: Left Foot - Lateral Wound Open Wounding Event: Pressure Injury Status: Date Acquired: 01/19/2019 Comorbid Cataracts, Middle ear problems, Sleep Apnea, Weeks Of Treatment: 7 History: Hypertension, Type II Diabetes, Gout, Clustered Wound: No Osteoarthritis, Neuropathy Photos Wound Measurements Length: (cm) 0.9 % Reduction i Width: (cm) 1.1 % Reduction i Depth: (cm) 0.1 Epithelializa Area: (cm) 0.778 Tunneling: Volume: (cm) 0.078 Undermining: n Area: -182.9% n Volume: 4.9% tion: None No No Wound Description Classification: Grade 2 Foul Odor Aft Wound Margin: Thickened Slough/Fibrin Exudate Amount: Medium Exudate Type: Serous Exudate Color: amber er Cleansing: No o Yes Wound Bed Granulation Amount: Medium (34-66%) Exposed Structure Granulation Quality: Pale Fascia Exposed: No Necrotic Amount: Medium (34-66%) Fat Layer (Subcutaneous Tissue) Exposed: Yes Necrotic Quality: Adherent Slough Tendon Exposed: No Muscle Exposed: No Joint Exposed: No Bone Exposed: No Treatment Notes KINGSTYN, DERUITER ([id number]) Wound #5 (Left, Lateral Foot) Notes Silver collagen, Foam 2Layer , TCC-Left Electronic Signature(s) Signed: 04/02/2019 4:13:29 PM By: Montey Hora  Entered By: Montey Hora on 04/02/2019 13:02:31 [name]  ([id number]) -------------------------------------------------------------------------------- [place] Details Patient Name: [name] Date of Service: 04/02/2019 12:45 PM Medical Record Number: [id number] Patient Account Number: [id number] Date of Birth/Sex: 1945-10-29 (73 y.o. M) Treating RN: Harold Barban Primary Care Dailan Pfalzgraf: [name] Other Clinician: Referring Brayen Bunn: [name] Treating Ledia Hanford/Extender: [name], HOYT Weeks in Treatment: 7 Vital Signs Time Taken: 13:00 Temperature (F): 98.8 Height (in): 71 Pulse (bpm): 119 Weight (lbs): 205 Respiratory Rate (breaths/min): 16 Body Mass Index (BMI): 28.6 Blood Pressure (mmHg): 162/76 Reference Range: 80 - 120 mg / dl Electronic Signature(s) Signed: 04/02/2019 2:03:17 PM By: Lorine Bears RCP, RRT, CHT Entered By: Lorine Bears on 04/02/2019 13:01:03

## 2019-04-04 ENCOUNTER — Other Ambulatory Visit: Payer: Self-pay

## 2019-04-04 ENCOUNTER — Ambulatory Visit: Payer: Medicare Other

## 2019-04-04 DIAGNOSIS — E1142 Type 2 diabetes mellitus with diabetic polyneuropathy: Secondary | ICD-10-CM | POA: Diagnosis not present

## 2019-04-04 DIAGNOSIS — E11621 Type 2 diabetes mellitus with foot ulcer: Secondary | ICD-10-CM | POA: Diagnosis not present

## 2019-04-04 DIAGNOSIS — M1990 Unspecified osteoarthritis, unspecified site: Secondary | ICD-10-CM | POA: Diagnosis not present

## 2019-04-04 DIAGNOSIS — Z881 Allergy status to other antibiotic agents status: Secondary | ICD-10-CM | POA: Diagnosis not present

## 2019-04-04 DIAGNOSIS — S61511A Laceration without foreign body of right wrist, initial encounter: Secondary | ICD-10-CM | POA: Diagnosis not present

## 2019-04-04 DIAGNOSIS — M109 Gout, unspecified: Secondary | ICD-10-CM | POA: Diagnosis not present

## 2019-04-04 DIAGNOSIS — E1151 Type 2 diabetes mellitus with diabetic peripheral angiopathy without gangrene: Secondary | ICD-10-CM | POA: Diagnosis not present

## 2019-04-04 DIAGNOSIS — M21372 Foot drop, left foot: Secondary | ICD-10-CM | POA: Diagnosis not present

## 2019-04-04 DIAGNOSIS — I10 Essential (primary) hypertension: Secondary | ICD-10-CM | POA: Diagnosis not present

## 2019-04-04 DIAGNOSIS — L97522 Non-pressure chronic ulcer of other part of left foot with fat layer exposed: Secondary | ICD-10-CM | POA: Diagnosis not present

## 2019-04-04 DIAGNOSIS — Z809 Family history of malignant neoplasm, unspecified: Secondary | ICD-10-CM | POA: Diagnosis not present

## 2019-04-04 DIAGNOSIS — Z87891 Personal history of nicotine dependence: Secondary | ICD-10-CM | POA: Diagnosis not present

## 2019-04-04 DIAGNOSIS — Z8249 Family history of ischemic heart disease and other diseases of the circulatory system: Secondary | ICD-10-CM | POA: Diagnosis not present

## 2019-04-04 DIAGNOSIS — Z794 Long term (current) use of insulin: Secondary | ICD-10-CM | POA: Diagnosis not present

## 2019-04-04 DIAGNOSIS — Z833 Family history of diabetes mellitus: Secondary | ICD-10-CM | POA: Diagnosis not present

## 2019-04-05 ENCOUNTER — Ambulatory Visit: Payer: Medicare Other | Admitting: Physician Assistant

## 2019-04-05 ENCOUNTER — Encounter (HOSPITAL_COMMUNITY): Payer: Self-pay

## 2019-04-05 ENCOUNTER — Other Ambulatory Visit: Payer: Self-pay

## 2019-04-05 ENCOUNTER — Ambulatory Visit (HOSPITAL_COMMUNITY)
Admission: EM | Admit: 2019-04-05 | Discharge: 2019-04-05 | Disposition: A | Discharge status: Home / Self Care | Payer: Medicare Other | Source: Home / Self Care

## 2019-04-05 DIAGNOSIS — R739 Hyperglycemia, unspecified: Secondary | ICD-10-CM

## 2019-04-05 LAB — GLUCOSE, CAPILLARY: Glucose-Capillary: 370 mg/dL — ABNORMAL HIGH (ref 70–99)

## 2019-04-05 LAB — CBG MONITORING, ED: Glucose-Capillary: 370 mg/dL — ABNORMAL HIGH (ref 70–99)

## 2019-04-05 MED ORDER — ONDANSETRON 4 MG PO TBDP
4.0000 mg | ORAL_TABLET | Freq: Once | ORAL | Status: AC
  Administered 2019-04-05: 13:00:00 4 mg via ORAL

## 2019-04-05 MED ORDER — ONDANSETRON 8 MG PO TBDP: 8.0000 mg | ORAL_TABLET | Freq: Three times a day (TID) | ORAL | 0 refills | Status: DC | PRN

## 2019-04-05 MED ORDER — ONDANSETRON 4 MG PO TBDP
ORAL_TABLET | ORAL | Status: AC
  Filled 2019-04-05: qty 1

## 2019-04-05 NOTE — ED Provider Notes (Addendum)
[place]    CSN: [id number] Arrival date & time: 04/05/19  1250      History   Chief Complaint Chief Complaint  Patient presents with  . Elevated Blood Sugar    HPI [name] is a 73 y.o. male.   Initial [place] visit for this 73 yo man.  Pt presents with elevated blood sugar and nausea since yesterday.  He has not had any insulin today.  He has vomited twice.  He has been instructed to go to ED but refused to go.  His grandson brought him in.  Patient is tolerating liquids.  No abdominal pain.  No cough or fever.   His memory is poor and grandson has to prompt patient for accurate hx.  Blood sugar was over 500 last night, over 400 this morning.     Past Medical History:  Diagnosis Date  . Allergic rhinitis due to pollen   . Chronic kidney disease, stage III (moderate) 12/19/2012  . Depression   . Diabetes mellitus ([place])   . Diverticulosis   . Erectile dysfunction associated with type 2 diabetes mellitus ([place]) 05/28/2015  . Former very heavy cigarette smoker (more than 40 per day) 10/07/2014  . GERD (gastroesophageal reflux disease)   . Gout   . Hyperlipidemia   . Hypertension   . Hypogonadism male 06/14/2012  . Iron deficiency   . Memory loss   . Osteoarthritis   . Personal history of colonic polyps    1996    Patient Active Problem List   Diagnosis Date Noted  . Dropfoot 03/15/2019  . Abdominal pain, vomiting, and diarrhea 08/01/2018  . Dehydration 08/01/2018  . Mild cognitive impairment 05/08/2018  . Alteration consciousness 05/08/2018  . Poor balance 02/23/2018  . Falls 10/14/2017  . Esophageal dysphagia   . Foot ulcer ([place]) 06/10/2017  . Uncontrolled type 2 diabetes mellitus with peripheral neuropathy ([place]) 05/11/2016  . Erectile dysfunction associated with type 2 diabetes mellitus ([place]) 05/28/2015  . Former very heavy cigarette smoker (more than 40 per day) 10/07/2014  . Chronic kidney disease, stage III (moderate) 12/19/2012  .  Hypogonadism male 06/14/2012  . Chest pain 11/11/2011  . Obesity (BMI 30-39.9) 09/27/2011  . OSA (obstructive sleep apnea) 03/09/2011  . Diabetic neuropathy ([place]) 12/29/2010  . Family history of MI (myocardial infarction) 09/16/2010  . HIP REPLACEMENT, RIGHT, HX OF 02/05/2010  . Hypertriglyceridemia 06/13/2008  . GOUT 06/13/2008  . Major depressive disorder, recurrent episode, in partial remission ([place]) 06/13/2008  . Essential hypertension 06/13/2008  . ALLERGIC RHINITIS 06/13/2008  . GERD 06/13/2008  . OSTEOARTHRITIS 06/13/2008    Past Surgical History:  Procedure Laterality Date  . CARDIAC CATHETERIZATION  11/2011   [facility]  . CHOLECYSTECTOMY  1980  . ESOPHAGOGASTRODUODENOSCOPY (EGD) WITH PROPOFOL N/A 09/27/2017   Procedure: ESOPHAGOGASTRODUODENOSCOPY (EGD) WITH PROPOFOL;  Surgeon: Lin Landsman, MD;  Location: [facility];  Service: Gastroenterology;  Laterality: N/A;  . EYE SURGERY Right 07/29/2016   cataract - Dr. Manuella Ghazi  . EYE SURGERY Left 08/23/2106   cataract - Dr. Manuella Ghazi  . TOTAL HIP ARTHROPLASTY  2009   Dr. Gladstone Lighter  . TOTAL HIP ARTHROPLASTY         Home Medications    Prior to Admission medications   Medication Sig Start Date End Date Taking? Authorizing Provider  acetaminophen (QC ACETAMINOPHEN 8HR ARTH PAIN) 650 MG CR tablet Take 650 mg by mouth every 8 (eight) hours as needed for pain.    [provider]  Ascorbic Acid (VITAMIN C) 1000 MG tablet Take 1,000 mg by mouth daily.    [provider]  aspirin EC 81 MG tablet Take 81 mg by mouth at bedtime.    [provider]  atenolol (TENORMIN) 50 MG tablet TAKE 1 TABLET BY MOUTH EVERY DAY 12/22/18   Copland, Frederico Hamman, MD  cholecalciferol (VITAMIN D) 1000 UNITS tablet Take 1,000 Units by mouth daily.      [provider]  colchicine 0.6 MG tablet Take 1 tablet (0.6 mg total) by mouth 2 (two) times daily. 07/12/18   Copland, Frederico Hamman, MD  donepezil (ARICEPT) 10 MG tablet Take 1 tablet  (10 mg total) by mouth at bedtime. 05/18/18   Copland, Frederico Hamman, MD  doxazosin (CARDURA) 8 MG tablet TAKE ONE-HALF TABLET AT BEDTIME 01/26/19   Copland, Frederico Hamman, MD  finasteride (PROSCAR) 5 MG tablet Take 1 tablet (5 mg total) by mouth daily. 01/16/18   Copland, Frederico Hamman, MD  fluticasone (FLONASE) 50 MCG/ACT nasal spray PLACE 2 SPRAYS IN EACH NOSTRIL DAILY 01/26/19   Copland, Frederico Hamman, MD  ibuprofen (ADVIL,MOTRIN) 200 MG tablet Take 200-400 mg by mouth every 6 (six) hours as needed for headache (pain).    [provider]  indomethacin (INDOCIN) 50 MG capsule Take 1 capsule (50 mg total) by mouth 2 (two) times daily with a meal. 07/12/18   Copland, Frederico Hamman, MD  insulin NPH Human (HUMULIN N,NOVOLIN N) 100 UNIT/ML injection Inject 0.25 mLs (25 Units total) into the skin 2 (two) times daily before a meal. 10/14/17   Ria Bush, MD  insulin regular (NOVOLIN R,HUMULIN R) 100 units/mL injection Inject 0.2 mLs (20 Units total) into the skin 2 (two) times daily before a meal. 10/14/17   Ria Bush, MD  lisinopril (ZESTRIL) 10 MG tablet TAKE 1 TABLET BY MOUTH EVERY DAY 01/26/19   Copland, Frederico Hamman, MD  memantine (NAMENDA) 10 MG tablet Take 1 tablet (10 mg total) by mouth 2 (two) times daily. 05/18/18   Copland, Frederico Hamman, MD  omeprazole (PRILOSEC) 40 MG capsule Take 1 capsule (40 mg total) by mouth 2 (two) times daily before a meal. 08/21/18 02/17/19  Copland, Frederico Hamman, MD  ondansetron (ZOFRAN) 4 MG tablet Take 1 tablet (4 mg total) by mouth every 8 (eight) hours as needed for nausea or vomiting. 08/21/18   Copland, Frederico Hamman, MD  Semaglutide,0.25 or 0.5MG /DOS, (OZEMPIC, 0.25 OR 0.5 MG/DOSE,) 2 MG/1.5ML SOPN Inject 0.5 mg into the skin once a week. 03/20/19   Philemon Kingdom, MD  sildenafil (REVATIO) 20 MG tablet Generic Revatio / Sildanefil 20 mg. 2 - 5 tabs 30 mins prior to intercourse. 04/13/18   Copland, Frederico Hamman, MD  simvastatin (ZOCOR) 40 MG tablet Take 1 tablet (40 mg total) by mouth at bedtime. 05/18/18    Copland, Frederico Hamman, MD  testosterone cypionate (DEPOTESTOSTERONE CYPIONATE) 200 MG/ML injection Inject 0.75 mLs (150 mg total) into the muscle every 14 (fourteen) days. 03/12/19   Copland, Frederico Hamman, MD  triamcinolone cream (KENALOG) 0.1 %  03/14/19   [provider]  valACYclovir (VALTREX) 1000 MG tablet Take 1,000 mg by mouth 3 (three) times daily. 08/17/18   [provider]  venlafaxine XR (EFFEXOR-XR) 75 MG 24 hr capsule TAKE 1 CAPSULE BY MOUTH EVERY MORNING AND 2 CAPSULES AT BEDTIME 01/27/19   Copland, Frederico Hamman, MD    Family History Family History  Problem Relation Age of Onset  . Diabetes Mother   . Alzheimer's disease Mother   . Diabetes Father   . Lung cancer Father  Age 65  . Stroke Father   . Heart attack Father   . Lung cancer Sister        lung  . Heart disease Maternal Grandfather   . COPD Sister   . Heart attack Sister   . Heart disease Sister     Social History Social History   Tobacco Use  . Smoking status: Former Smoker    Packs/day: 3.00    Years: 35.00    Pack years: 105.00    Types: Cigarettes    Quit date: 04/12/2000    Years since quitting: 18.9  . Smokeless tobacco: Never Used  Substance Use Topics  . Alcohol use: No  . Drug use: No     Allergies   Tetracycline   Review of Systems Review of Systems  Constitutional: Positive for fatigue.  Gastrointestinal: Positive for diarrhea and vomiting.  All other systems reviewed and are negative.    Physical Exam Triage Vital Signs ED Triage Vitals  Enc Vitals Group     BP 04/05/19 1255 (!) 157/89     Pulse Rate 04/05/19 1255 (!) 115     Resp 04/05/19 1255 20     Temp 04/05/19 1255 98.1 F (36.7 C)     Temp Source 04/05/19 1255 Oral     SpO2 04/05/19 1255 93 %     Weight --      Height --      Head Circumference --      Peak Flow --      Pain Score 04/05/19 1258 1     Pain Loc --      Pain Edu? --      Excl. in [place]? --    No data found.  Updated Vital Signs BP (!)  157/89 (BP Location: Right Arm)   Pulse (!) 115   Temp 98.1 F (36.7 C) (Oral)   Resp 20   SpO2 93%    Physical Exam Vitals and nursing note reviewed.  Constitutional:      General: He is not in acute distress.    Appearance: Normal appearance. He is ill-appearing. He is not toxic-appearing.  HENT:     Head: Normocephalic.  Eyes:     Conjunctiva/sclera: Conjunctivae normal.  Cardiovascular:     Rate and Rhythm: Tachycardia present.     Pulses: Normal pulses.  Pulmonary:     Effort: Pulmonary effort is normal.  Abdominal:     Palpations: There is no mass.     Tenderness: There is no abdominal tenderness. There is no guarding or rebound.  Musculoskeletal:        General: Normal range of motion.     Cervical back: Normal range of motion and neck supple.  Skin:    General: Skin is warm and dry.  Neurological:     General: No focal deficit present.     Mental Status: He is alert.  Psychiatric:        Mood and Affect: Mood normal.      UC Treatments / Results  Labs (all labs ordered are listed, but only abnormal results are displayed) Labs Reviewed  GLUCOSE, CAPILLARY - Abnormal; Notable for the following components:      Result Value   Glucose-Capillary 370 (*)    All other components within normal limits  CBG MONITORING, ED - Abnormal; Notable for the following components:   Glucose-Capillary 370 (*)    All other components within normal limits    EKG   Radiology No  results found.  Procedures Procedures (including critical care time)  Medications Ordered in UC Medications  ondansetron (ZOFRAN-ODT) disintegrating tablet 4 mg (4 mg Oral Given 04/05/19 1302)    Initial Impression / Assessment and Plan / UC Course  I have reviewed the triage vital signs and the nursing notes.  Pertinent labs & imaging results that were available during my care of the patient were reviewed by me and considered in my medical decision making (see chart for details).      Final Clinical Impressions(s) / UC Diagnoses   Final diagnoses:  Hyperglycemia     Discharge Instructions     Take the insulin twice a day, both N and R, until the blood sugar is below 200.  If you continue to vomit, go to the emergency department    ED Prescriptions    None     I have reviewed the PDMP during this encounter.   Robyn Haber, MD 04/05/19 1320    Robyn Haber, MD 04/05/19 1335

## 2019-04-05 NOTE — ED Triage Notes (Signed)
Pt presents with elevated blood sugar and nausea since yesterday.

## 2019-04-05 NOTE — Discharge Instructions (Addendum)
Take the insulin twice a day, both N and R, until the blood sugar is below 200.  If you continue to vomit, go to the emergency department

## 2019-04-06 ENCOUNTER — Emergency Department (HOSPITAL_COMMUNITY): Payer: Medicare Other

## 2019-04-06 ENCOUNTER — Encounter (HOSPITAL_COMMUNITY): Payer: Self-pay | Admitting: Pharmacy Technician

## 2019-04-06 ENCOUNTER — Inpatient Hospital Stay (HOSPITAL_COMMUNITY)
Admission: EM | Admit: 2019-04-06 | Discharge: 2019-04-08 | DRG: 392 | Disposition: A | Discharge status: Home / Self Care | Payer: Medicare Other | Attending: Internal Medicine | Admitting: Internal Medicine

## 2019-04-06 ENCOUNTER — Other Ambulatory Visit: Payer: Self-pay

## 2019-04-06 DIAGNOSIS — IMO0002 Reserved for concepts with insufficient information to code with codable children: Secondary | ICD-10-CM | POA: Diagnosis present

## 2019-04-06 DIAGNOSIS — K4020 Bilateral inguinal hernia, without obstruction or gangrene, not specified as recurrent: Secondary | ICD-10-CM | POA: Diagnosis not present

## 2019-04-06 DIAGNOSIS — N179 Acute kidney failure, unspecified: Secondary | ICD-10-CM | POA: Diagnosis present

## 2019-04-06 DIAGNOSIS — I10 Essential (primary) hypertension: Secondary | ICD-10-CM | POA: Diagnosis not present

## 2019-04-06 DIAGNOSIS — B3749 Other urogenital candidiasis: Secondary | ICD-10-CM | POA: Diagnosis present

## 2019-04-06 DIAGNOSIS — E1169 Type 2 diabetes mellitus with other specified complication: Secondary | ICD-10-CM | POA: Diagnosis not present

## 2019-04-06 DIAGNOSIS — N200 Calculus of kidney: Secondary | ICD-10-CM | POA: Diagnosis not present

## 2019-04-06 DIAGNOSIS — A084 Viral intestinal infection, unspecified: Secondary | ICD-10-CM | POA: Diagnosis not present

## 2019-04-06 DIAGNOSIS — L309 Dermatitis, unspecified: Secondary | ICD-10-CM | POA: Diagnosis present

## 2019-04-06 DIAGNOSIS — I129 Hypertensive chronic kidney disease with stage 1 through stage 4 chronic kidney disease, or unspecified chronic kidney disease: Secondary | ICD-10-CM | POA: Diagnosis not present

## 2019-04-06 DIAGNOSIS — Z20828 Contact with and (suspected) exposure to other viral communicable diseases: Secondary | ICD-10-CM | POA: Diagnosis present

## 2019-04-06 DIAGNOSIS — R112 Nausea with vomiting, unspecified: Secondary | ICD-10-CM | POA: Diagnosis not present

## 2019-04-06 DIAGNOSIS — N1831 Chronic kidney disease, stage 3a: Secondary | ICD-10-CM | POA: Diagnosis not present

## 2019-04-06 DIAGNOSIS — L97521 Non-pressure chronic ulcer of other part of left foot limited to breakdown of skin: Secondary | ICD-10-CM | POA: Diagnosis not present

## 2019-04-06 DIAGNOSIS — R1110 Vomiting, unspecified: Secondary | ICD-10-CM | POA: Diagnosis not present

## 2019-04-06 DIAGNOSIS — Z794 Long term (current) use of insulin: Secondary | ICD-10-CM

## 2019-04-06 DIAGNOSIS — L97428 Non-pressure chronic ulcer of left heel and midfoot with other specified severity: Secondary | ICD-10-CM | POA: Diagnosis present

## 2019-04-06 DIAGNOSIS — Z825 Family history of asthma and other chronic lower respiratory diseases: Secondary | ICD-10-CM

## 2019-04-06 DIAGNOSIS — I160 Hypertensive urgency: Secondary | ICD-10-CM | POA: Diagnosis not present

## 2019-04-06 DIAGNOSIS — E860 Dehydration: Secondary | ICD-10-CM

## 2019-04-06 DIAGNOSIS — Z8249 Family history of ischemic heart disease and other diseases of the circulatory system: Secondary | ICD-10-CM

## 2019-04-06 DIAGNOSIS — M109 Gout, unspecified: Secondary | ICD-10-CM | POA: Diagnosis present

## 2019-04-06 DIAGNOSIS — Z79899 Other long term (current) drug therapy: Secondary | ICD-10-CM

## 2019-04-06 DIAGNOSIS — F0390 Unspecified dementia without behavioral disturbance: Secondary | ICD-10-CM | POA: Diagnosis present

## 2019-04-06 DIAGNOSIS — E11621 Type 2 diabetes mellitus with foot ulcer: Secondary | ICD-10-CM | POA: Diagnosis present

## 2019-04-06 DIAGNOSIS — E291 Testicular hypofunction: Secondary | ICD-10-CM | POA: Diagnosis present

## 2019-04-06 DIAGNOSIS — G3184 Mild cognitive impairment, so stated: Secondary | ICD-10-CM | POA: Diagnosis not present

## 2019-04-06 DIAGNOSIS — R338 Other retention of urine: Secondary | ICD-10-CM | POA: Diagnosis not present

## 2019-04-06 DIAGNOSIS — Z9049 Acquired absence of other specified parts of digestive tract: Secondary | ICD-10-CM

## 2019-04-06 DIAGNOSIS — J301 Allergic rhinitis due to pollen: Secondary | ICD-10-CM | POA: Diagnosis present

## 2019-04-06 DIAGNOSIS — Z801 Family history of malignant neoplasm of trachea, bronchus and lung: Secondary | ICD-10-CM

## 2019-04-06 DIAGNOSIS — E785 Hyperlipidemia, unspecified: Secondary | ICD-10-CM | POA: Diagnosis not present

## 2019-04-06 DIAGNOSIS — L97509 Non-pressure chronic ulcer of other part of unspecified foot with unspecified severity: Secondary | ICD-10-CM | POA: Diagnosis present

## 2019-04-06 DIAGNOSIS — E1165 Type 2 diabetes mellitus with hyperglycemia: Secondary | ICD-10-CM | POA: Diagnosis present

## 2019-04-06 DIAGNOSIS — F339 Major depressive disorder, recurrent, unspecified: Secondary | ICD-10-CM | POA: Diagnosis present

## 2019-04-06 DIAGNOSIS — N521 Erectile dysfunction due to diseases classified elsewhere: Secondary | ICD-10-CM | POA: Diagnosis present

## 2019-04-06 DIAGNOSIS — E1122 Type 2 diabetes mellitus with diabetic chronic kidney disease: Secondary | ICD-10-CM | POA: Diagnosis present

## 2019-04-06 DIAGNOSIS — N401 Enlarged prostate with lower urinary tract symptoms: Secondary | ICD-10-CM | POA: Diagnosis present

## 2019-04-06 DIAGNOSIS — E861 Hypovolemia: Secondary | ICD-10-CM | POA: Diagnosis not present

## 2019-04-06 DIAGNOSIS — Z823 Family history of stroke: Secondary | ICD-10-CM

## 2019-04-06 DIAGNOSIS — G4733 Obstructive sleep apnea (adult) (pediatric): Secondary | ICD-10-CM | POA: Diagnosis present

## 2019-04-06 DIAGNOSIS — Z833 Family history of diabetes mellitus: Secondary | ICD-10-CM

## 2019-04-06 DIAGNOSIS — K219 Gastro-esophageal reflux disease without esophagitis: Secondary | ICD-10-CM | POA: Diagnosis not present

## 2019-04-06 DIAGNOSIS — N1832 Chronic kidney disease, stage 3b: Secondary | ICD-10-CM | POA: Diagnosis not present

## 2019-04-06 DIAGNOSIS — Z7982 Long term (current) use of aspirin: Secondary | ICD-10-CM

## 2019-04-06 DIAGNOSIS — N281 Cyst of kidney, acquired: Secondary | ICD-10-CM | POA: Diagnosis present

## 2019-04-06 DIAGNOSIS — E871 Hypo-osmolality and hyponatremia: Secondary | ICD-10-CM

## 2019-04-06 DIAGNOSIS — N183 Chronic kidney disease, stage 3 unspecified: Secondary | ICD-10-CM | POA: Diagnosis present

## 2019-04-06 DIAGNOSIS — Z86010 Personal history of colonic polyps: Secondary | ICD-10-CM

## 2019-04-06 DIAGNOSIS — Z87891 Personal history of nicotine dependence: Secondary | ICD-10-CM

## 2019-04-06 DIAGNOSIS — Z820 Family history of epilepsy and other diseases of the nervous system: Secondary | ICD-10-CM

## 2019-04-06 DIAGNOSIS — E1142 Type 2 diabetes mellitus with diabetic polyneuropathy: Secondary | ICD-10-CM | POA: Diagnosis present

## 2019-04-06 DIAGNOSIS — Z96641 Presence of right artificial hip joint: Secondary | ICD-10-CM | POA: Diagnosis present

## 2019-04-06 DIAGNOSIS — L709 Acne, unspecified: Secondary | ICD-10-CM | POA: Diagnosis present

## 2019-04-06 DIAGNOSIS — Z881 Allergy status to other antibiotic agents status: Secondary | ICD-10-CM

## 2019-04-06 LAB — COMPREHENSIVE METABOLIC PANEL
ALT: 13 U/L (ref 0–44)
AST: 13 U/L — ABNORMAL LOW (ref 15–41)
Albumin: 3.6 g/dL (ref 3.5–5.0)
Alkaline Phosphatase: 91 U/L (ref 38–126)
Anion gap: 14 (ref 5–15)
BUN: 20 mg/dL (ref 8–23)
CO2: 25 mmol/L (ref 22–32)
Calcium: 9.4 mg/dL (ref 8.9–10.3)
Chloride: 88 mmol/L — ABNORMAL LOW (ref 98–111)
Creatinine, Ser: 1.89 mg/dL — ABNORMAL HIGH (ref 0.61–1.24)
GFR calc Af Amer: 40 mL/min — ABNORMAL LOW (ref >60)
GFR calc non Af Amer: 34 mL/min — ABNORMAL LOW (ref >60)
Glucose, Bld: 310 mg/dL — ABNORMAL HIGH (ref 70–99)
Potassium: 3.8 mmol/L (ref 3.5–5.1)
Sodium: 127 mmol/L — ABNORMAL LOW (ref 135–145)
Total Bilirubin: 1.4 mg/dL — ABNORMAL HIGH (ref 0.3–1.2)
Total Protein: 7.3 g/dL (ref 6.5–8.1)

## 2019-04-06 LAB — CBC
HCT: 41.7 % (ref 39.0–52.0)
Hemoglobin: 14.8 g/dL (ref 13.0–17.0)
MCH: 28.9 pg (ref 26.0–34.0)
MCHC: 35.5 g/dL (ref 30.0–36.0)
MCV: 81.4 fL (ref 80.0–100.0)
Platelets: 245 10*3/uL (ref 150–400)
RBC: 5.12 MIL/uL (ref 4.22–5.81)
RDW: 12.1 % (ref 11.5–15.5)
WBC: 7.7 10*3/uL (ref 4.0–10.5)
nRBC: 0 % (ref 0.0–0.2)

## 2019-04-06 LAB — CBG MONITORING, ED
Glucose-Capillary: 300 mg/dL — ABNORMAL HIGH (ref 70–99)
Glucose-Capillary: 210 mg/dL — ABNORMAL HIGH (ref 70–99)

## 2019-04-06 LAB — TROPONIN I (HIGH SENSITIVITY)
Troponin I (High Sensitivity): 14 ng/L (ref <18)
Troponin I (High Sensitivity): 15 ng/L (ref <18)

## 2019-04-06 MED ORDER — PROMETHAZINE HCL 25 MG/ML IJ SOLN
12.5000 mg | Freq: Once | INTRAMUSCULAR | Status: AC
  Administered 2019-04-06: 23:00:00 12.5 mg via INTRAVENOUS
  Filled 2019-04-06: qty 1

## 2019-04-06 MED ORDER — SODIUM CHLORIDE 0.9 % IV BOLUS
1000.0000 mL | Freq: Once | INTRAVENOUS | Status: AC
  Administered 2019-04-06: 1000 mL via INTRAVENOUS

## 2019-04-06 MED ORDER — ONDANSETRON 4 MG PO TBDP
4.0000 mg | ORAL_TABLET | Freq: Once | ORAL | Status: AC | PRN
  Administered 2019-04-06: 13:00:00 4 mg via ORAL
  Filled 2019-04-06: qty 1

## 2019-04-06 MED ORDER — ONDANSETRON HCL 4 MG/2ML IJ SOLN
4.0000 mg | Freq: Once | INTRAMUSCULAR | Status: AC
  Administered 2019-04-06: 20:00:00 4 mg via INTRAVENOUS
  Filled 2019-04-06: qty 2

## 2019-04-06 MED ORDER — SODIUM CHLORIDE 0.9 % IV BOLUS
1000.0000 mL | Freq: Once | INTRAVENOUS | Status: AC
Start: 2019-04-06 — End: 2019-04-06
  Administered 2019-04-06: 1000 mL via INTRAVENOUS

## 2019-04-06 NOTE — ED Triage Notes (Signed)
Pt arrives pov with emesis and elevated blood sugar. Pt states has not been able to tolerate po intake in 2-3 days.

## 2019-04-06 NOTE — ED Notes (Signed)
CBG 300.

## 2019-04-06 NOTE — ED Notes (Signed)
Bladder Scan showed approx 642ml of urine. MD notified.

## 2019-04-06 NOTE — ED Provider Notes (Signed)
[facility] EMERGENCY DEPARTMENT Provider Note   CSN: [id number] Arrival date & time: 04/06/19  1251     History Chief Complaint  Patient presents with  . Hyperglycemia  . Emesis    [name] is a 73 y.o. male.  Pt presents to the ED today with n/v.  The pt said he has been unable to keep much down.  He had some applesauce yesterday, but nothing today.  The pt said he has not had any f/c.  The pt is a diabetic, but has not been taking his meds since he's been vomiting.  The pt said he's not been able to stand up without getting dizzy.  He did go to UC yesterday.  They told him to come to the ED, but he did not want to come.          Past Medical History:  Diagnosis Date  . Allergic rhinitis due to pollen   . Chronic kidney disease, stage III (moderate) 12/19/2012  . Depression   . Diabetes mellitus ([place])   . Diverticulosis   . Erectile dysfunction associated with type 2 diabetes mellitus ([place]) 05/28/2015  . Former very heavy cigarette smoker (more than 40 per day) 10/07/2014  . GERD (gastroesophageal reflux disease)   . Gout   . Hyperlipidemia   . Hypertension   . Hypogonadism male 06/14/2012  . Iron deficiency   . Memory loss   . Osteoarthritis   . Personal history of colonic polyps    1996    Patient Active Problem List   Diagnosis Date Noted  . Dropfoot 03/15/2019  . Abdominal pain, vomiting, and diarrhea 08/01/2018  . Dehydration 08/01/2018  . Mild cognitive impairment 05/08/2018  . Alteration consciousness 05/08/2018  . Poor balance 02/23/2018  . Falls 10/14/2017  . Esophageal dysphagia   . Foot ulcer ([place]) 06/10/2017  . Uncontrolled type 2 diabetes mellitus with peripheral neuropathy ([place]) 05/11/2016  . Erectile dysfunction associated with type 2 diabetes mellitus ([place]) 05/28/2015  . Former very heavy cigarette smoker (more than 40 per day) 10/07/2014  . Chronic kidney disease, stage III (moderate) 12/19/2012  . Hypogonadism male  06/14/2012  . Chest pain 11/11/2011  . Obesity (BMI 30-39.9) 09/27/2011  . OSA (obstructive sleep apnea) 03/09/2011  . Diabetic neuropathy ([place]) 12/29/2010  . Family history of MI (myocardial infarction) 09/16/2010  . HIP REPLACEMENT, RIGHT, HX OF 02/05/2010  . Hypertriglyceridemia 06/13/2008  . GOUT 06/13/2008  . Major depressive disorder, recurrent episode, in partial remission ([place]) 06/13/2008  . Essential hypertension 06/13/2008  . ALLERGIC RHINITIS 06/13/2008  . GERD 06/13/2008  . OSTEOARTHRITIS 06/13/2008    Past Surgical History:  Procedure Laterality Date  . CARDIAC CATHETERIZATION  11/2011   [facility]  . CHOLECYSTECTOMY  1980  . ESOPHAGOGASTRODUODENOSCOPY (EGD) WITH PROPOFOL N/A 09/27/2017   Procedure: ESOPHAGOGASTRODUODENOSCOPY (EGD) WITH PROPOFOL;  Surgeon: Lin Landsman, MD;  Location: [facility];  Service: Gastroenterology;  Laterality: N/A;  . EYE SURGERY Right 07/29/2016   cataract - Dr. Manuella Ghazi  . EYE SURGERY Left 08/23/2106   cataract - Dr. Manuella Ghazi  . TOTAL HIP ARTHROPLASTY  2009   Dr. Gladstone Lighter  . TOTAL HIP ARTHROPLASTY         Family History  Problem Relation Age of Onset  . Diabetes Mother   . Alzheimer's disease Mother   . Diabetes Father   . Lung cancer Father        Age 19  . Stroke Father   . Heart  attack Father   . Lung cancer Sister        lung  . Heart disease Maternal Grandfather   . COPD Sister   . Heart attack Sister   . Heart disease Sister     Social History   Tobacco Use  . Smoking status: Former Smoker    Packs/day: 3.00    Years: 35.00    Pack years: 105.00    Types: Cigarettes    Quit date: 04/12/2000    Years since quitting: 18.9  . Smokeless tobacco: Never Used  Substance Use Topics  . Alcohol use: No  . Drug use: No    Home Medications Prior to Admission medications   Medication Sig Start Date End Date Taking? Authorizing Provider  acetaminophen (QC ACETAMINOPHEN 8HR ARTH PAIN) 650 MG CR tablet Take 650 mg by mouth  every 8 (eight) hours as needed for pain.    [provider]  Ascorbic Acid (VITAMIN C) 1000 MG tablet Take 1,000 mg by mouth daily.    [provider]  aspirin EC 81 MG tablet Take 81 mg by mouth at bedtime.    [provider]  atenolol (TENORMIN) 50 MG tablet TAKE 1 TABLET BY MOUTH EVERY DAY 12/22/18   Copland, Frederico Hamman, MD  cholecalciferol (VITAMIN D) 1000 UNITS tablet Take 1,000 Units by mouth daily.      [provider]  colchicine 0.6 MG tablet Take 1 tablet (0.6 mg total) by mouth 2 (two) times daily. 07/12/18   Copland, Frederico Hamman, MD  donepezil (ARICEPT) 10 MG tablet Take 1 tablet (10 mg total) by mouth at bedtime. 05/18/18   Copland, Frederico Hamman, MD  doxazosin (CARDURA) 8 MG tablet TAKE ONE-HALF TABLET AT BEDTIME 01/26/19   Copland, Frederico Hamman, MD  finasteride (PROSCAR) 5 MG tablet Take 1 tablet (5 mg total) by mouth daily. 01/16/18   Copland, Frederico Hamman, MD  fluticasone (FLONASE) 50 MCG/ACT nasal spray PLACE 2 SPRAYS IN EACH NOSTRIL DAILY 01/26/19   Copland, Frederico Hamman, MD  ibuprofen (ADVIL,MOTRIN) 200 MG tablet Take 200-400 mg by mouth every 6 (six) hours as needed for headache (pain).    [provider]  indomethacin (INDOCIN) 50 MG capsule Take 1 capsule (50 mg total) by mouth 2 (two) times daily with a meal. 07/12/18   Copland, Frederico Hamman, MD  insulin NPH Human (HUMULIN N,NOVOLIN N) 100 UNIT/ML injection Inject 0.25 mLs (25 Units total) into the skin 2 (two) times daily before a meal. 10/14/17   Ria Bush, MD  insulin regular (NOVOLIN R,HUMULIN R) 100 units/mL injection Inject 0.2 mLs (20 Units total) into the skin 2 (two) times daily before a meal. 10/14/17   Ria Bush, MD  lisinopril (ZESTRIL) 10 MG tablet TAKE 1 TABLET BY MOUTH EVERY DAY 01/26/19   Copland, Frederico Hamman, MD  memantine (NAMENDA) 10 MG tablet Take 1 tablet (10 mg total) by mouth 2 (two) times daily. 05/18/18   Copland, Frederico Hamman, MD  omeprazole (PRILOSEC) 40 MG capsule Take 1 capsule (40 mg total)  by mouth 2 (two) times daily before a meal. 08/21/18 02/17/19  Copland, Frederico Hamman, MD  ondansetron (ZOFRAN) 4 MG tablet Take 1 tablet (4 mg total) by mouth every 8 (eight) hours as needed for nausea or vomiting. 08/21/18   Copland, Frederico Hamman, MD  ondansetron (ZOFRAN-ODT) 8 MG disintegrating tablet Take 1 tablet (8 mg total) by mouth every 8 (eight) hours as needed for nausea. 04/05/19   Robyn Haber, MD  Semaglutide,0.25 or 0.5MG /DOS, (OZEMPIC, 0.25 OR 0.5 MG/DOSE,) 2 MG/1.5ML SOPN  Inject 0.5 mg into the skin once a week. 03/20/19   Philemon Kingdom, MD  sildenafil (REVATIO) 20 MG tablet Generic Revatio / Sildanefil 20 mg. 2 - 5 tabs 30 mins prior to intercourse. 04/13/18   Copland, Frederico Hamman, MD  simvastatin (ZOCOR) 40 MG tablet Take 1 tablet (40 mg total) by mouth at bedtime. 05/18/18   Copland, Frederico Hamman, MD  testosterone cypionate (DEPOTESTOSTERONE CYPIONATE) 200 MG/ML injection Inject 0.75 mLs (150 mg total) into the muscle every 14 (fourteen) days. 03/12/19   Copland, Frederico Hamman, MD  triamcinolone cream (KENALOG) 0.1 %  03/14/19   [provider]  valACYclovir (VALTREX) 1000 MG tablet Take 1,000 mg by mouth 3 (three) times daily. 08/17/18   [provider]  venlafaxine XR (EFFEXOR-XR) 75 MG 24 hr capsule TAKE 1 CAPSULE BY MOUTH EVERY MORNING AND 2 CAPSULES AT BEDTIME 01/27/19   Copland, Frederico Hamman, MD    Allergies    Tetracycline  Review of Systems   Review of Systems  Gastrointestinal: Positive for nausea and vomiting.  All other systems reviewed and are negative.   Physical Exam Updated Vital Signs BP (!) 179/92   Pulse (!) 114   Temp 97.8 F (36.6 C) (Oral)   Resp 17   SpO2 98%   Physical Exam Vitals and nursing note reviewed.  Constitutional:      Appearance: Normal appearance.  HENT:     Head: Normocephalic and atraumatic.     Right Ear: External ear normal.     Left Ear: External ear normal.     Nose: Nose normal.     Mouth/Throat:     Mouth: Mucous membranes are  moist.     Pharynx: Oropharynx is clear.  Eyes:     Extraocular Movements: Extraocular movements intact.     Conjunctiva/sclera: Conjunctivae normal.     Pupils: Pupils are equal, round, and reactive to light.  Cardiovascular:     Rate and Rhythm: Normal rate and regular rhythm.     Pulses: Normal pulses.     Heart sounds: Normal heart sounds.  Pulmonary:     Effort: Pulmonary effort is normal.     Breath sounds: Normal breath sounds.  Abdominal:     General: Abdomen is flat. Bowel sounds are normal.     Palpations: Abdomen is soft.  Musculoskeletal:        General: Normal range of motion.     Cervical back: Normal range of motion and neck supple.  Skin:    General: Skin is warm.     Capillary Refill: Capillary refill takes less than 2 seconds.  Neurological:     General: No focal deficit present.     Mental Status: He is alert and oriented to person, place, and time.  Psychiatric:        Mood and Affect: Mood normal.        Behavior: Behavior normal.     ED Results / Procedures / Treatments   Labs (all labs ordered are listed, but only abnormal results are displayed) Labs Reviewed  COMPREHENSIVE METABOLIC PANEL - Abnormal; Notable for the following components:      Result Value   Sodium 127 (*)    Chloride 88 (*)    Glucose, Bld 310 (*)    Creatinine, Ser 1.89 (*)    AST 13 (*)    Total Bilirubin 1.4 (*)    GFR calc non Af Amer 34 (*)    GFR calc Af Amer 40 (*)    All  other components within normal limits  CBG MONITORING, ED - Abnormal; Notable for the following components:   Glucose-Capillary 300 (*)    All other components within normal limits  CBG MONITORING, ED - Abnormal; Notable for the following components:   Glucose-Capillary 210 (*)    All other components within normal limits  SARS CORONAVIRUS 2 (TAT 6-24 HRS)  CBC  URINALYSIS, ROUTINE W REFLEX MICROSCOPIC  CBG MONITORING, ED  TROPONIN I (HIGH SENSITIVITY)  TROPONIN I (HIGH SENSITIVITY)     EKG None  Radiology CT ABDOMEN PELVIS WO CONTRAST  Result Date: 04/06/2019 CLINICAL DATA:  Abdominal pain, vomiting, unable to tolerate p.o. intake for 2-3 days, history stage III chronic kidney disease, diabetes mellitus, hypertension EXAM: CT ABDOMEN AND PELVIS WITHOUT CONTRAST TECHNIQUE: Multidetector CT imaging of the abdomen and pelvis was performed following the standard protocol without IV contrast. Sagittal and coronal MPR images reconstructed from axial data set. No oral contrast administered. COMPARISON:  None FINDINGS: Lower chest: Minimal infiltrate or scarring at RIGHT lung base. Hepatobiliary: Gallbladder surgically absent. Liver unremarkable. Pancreas: Normal appearance Spleen: Normal appearance Adrenals/Urinary Tract: Adrenal glands normal appearance. Tiny nonobstructing calculi in both kidneys. RIGHT renal cyst laterally at mid inferior kidney 2.9 x 2.7 cm. No additional renal mass no ureteral calcification or dilatation. Bladder unremarkable. Stomach/Bowel: Normal appendix. Diverticulosis of ascending colon without evidence of diverticulitis. Duodenal diverticulum. Stomach and bowel loops otherwise normal appearance. Vascular/Lymphatic: Atherosclerotic calcifications aorta, iliac arteries, coronary arteries. No adenopathy. Reproductive: Prostatic enlargement with gland measuring 5.0 x 3.8 cm Other: Small BILATERAL inguinal hernias containing fat. No free air or free fluid. Musculoskeletal: Beam hardening artifacts in pelvis from RIGHT hip prosthesis. No acute osseous findings. IMPRESSION: Small BILATERAL nonobstructing renal calculi. RIGHT renal cyst 2.9 x 2.7 cm. Prostatic enlargement. Small BILATERAL inguinal hernias containing fat. Diverticulosis of ascending colon without evidence of diverticulitis. No acute intra-abdominal or intrapelvic abnormalities. Aortic Atherosclerosis (ICD10-I70.0). Electronically Signed   By: Lavonia Dana M.D.   On: 04/06/2019 21:47     Procedures Procedures (including critical care time)  Medications Ordered in ED Medications  ondansetron (ZOFRAN-ODT) disintegrating tablet 4 mg (4 mg Oral Given 04/06/19 1314)  sodium chloride 0.9 % bolus 1,000 mL (0 mLs Intravenous Stopped 04/06/19 2113)  ondansetron (ZOFRAN) injection 4 mg (4 mg Intravenous Given 04/06/19 2014)  sodium chloride 0.9 % bolus 1,000 mL (1,000 mLs Intravenous New Bag/Given 04/06/19 2320)  promethazine (PHENERGAN) injection 12.5 mg (12.5 mg Intravenous Given 04/06/19 2320)    ED Course  I have reviewed the triage vital signs and the nursing notes.  Pertinent labs & imaging results that were available during my care of the patient were reviewed by me and considered in my medical decision making (see chart for details).    MDM Rules/Calculators/A&P                     Pt is still not able to give any urine and vomited after IVFs and zofran.  The pt also had some diarrhea.  The pt is given additional IVFs and phenergan.  Pt has 600 cc in his bladder and said he has not urinated in 2 days.  Pt had a foley placed.  Pt does not feel any better.  He was d/w Dr. Flossie Buffy (triad) for admission.    Final Clinical Impression(s) / ED Diagnoses Final diagnoses:  Dehydration  AKI (acute kidney injury) ([place])  Nausea vomiting and diarrhea    Rx / DC Orders ED Discharge  Orders    None       Isla Pence, MD 04/06/19 [phone]

## 2019-04-07 ENCOUNTER — Encounter (HOSPITAL_COMMUNITY): Payer: Self-pay | Admitting: Family Medicine

## 2019-04-07 DIAGNOSIS — E860 Dehydration: Secondary | ICD-10-CM | POA: Diagnosis present

## 2019-04-07 DIAGNOSIS — E1122 Type 2 diabetes mellitus with diabetic chronic kidney disease: Secondary | ICD-10-CM | POA: Diagnosis present

## 2019-04-07 DIAGNOSIS — N1832 Chronic kidney disease, stage 3b: Secondary | ICD-10-CM

## 2019-04-07 DIAGNOSIS — J301 Allergic rhinitis due to pollen: Secondary | ICD-10-CM | POA: Diagnosis present

## 2019-04-07 DIAGNOSIS — R112 Nausea with vomiting, unspecified: Secondary | ICD-10-CM | POA: Diagnosis not present

## 2019-04-07 DIAGNOSIS — F0390 Unspecified dementia without behavioral disturbance: Secondary | ICD-10-CM | POA: Diagnosis present

## 2019-04-07 DIAGNOSIS — I10 Essential (primary) hypertension: Secondary | ICD-10-CM

## 2019-04-07 DIAGNOSIS — G3184 Mild cognitive impairment, so stated: Secondary | ICD-10-CM

## 2019-04-07 DIAGNOSIS — E1165 Type 2 diabetes mellitus with hyperglycemia: Secondary | ICD-10-CM | POA: Diagnosis present

## 2019-04-07 DIAGNOSIS — E1142 Type 2 diabetes mellitus with diabetic polyneuropathy: Secondary | ICD-10-CM | POA: Diagnosis present

## 2019-04-07 DIAGNOSIS — A084 Viral intestinal infection, unspecified: Secondary | ICD-10-CM | POA: Diagnosis present

## 2019-04-07 DIAGNOSIS — L97521 Non-pressure chronic ulcer of other part of left foot limited to breakdown of skin: Secondary | ICD-10-CM

## 2019-04-07 DIAGNOSIS — B3749 Other urogenital candidiasis: Secondary | ICD-10-CM | POA: Diagnosis present

## 2019-04-07 DIAGNOSIS — E785 Hyperlipidemia, unspecified: Secondary | ICD-10-CM | POA: Diagnosis present

## 2019-04-07 DIAGNOSIS — I129 Hypertensive chronic kidney disease with stage 1 through stage 4 chronic kidney disease, or unspecified chronic kidney disease: Secondary | ICD-10-CM | POA: Diagnosis present

## 2019-04-07 DIAGNOSIS — E871 Hypo-osmolality and hyponatremia: Secondary | ICD-10-CM

## 2019-04-07 DIAGNOSIS — N179 Acute kidney failure, unspecified: Secondary | ICD-10-CM | POA: Diagnosis not present

## 2019-04-07 DIAGNOSIS — G4733 Obstructive sleep apnea (adult) (pediatric): Secondary | ICD-10-CM | POA: Diagnosis present

## 2019-04-07 DIAGNOSIS — E291 Testicular hypofunction: Secondary | ICD-10-CM | POA: Diagnosis present

## 2019-04-07 DIAGNOSIS — N1831 Chronic kidney disease, stage 3a: Secondary | ICD-10-CM | POA: Diagnosis present

## 2019-04-07 DIAGNOSIS — E11621 Type 2 diabetes mellitus with foot ulcer: Secondary | ICD-10-CM | POA: Diagnosis present

## 2019-04-07 DIAGNOSIS — R338 Other retention of urine: Secondary | ICD-10-CM

## 2019-04-07 DIAGNOSIS — E861 Hypovolemia: Secondary | ICD-10-CM | POA: Diagnosis present

## 2019-04-07 DIAGNOSIS — L97428 Non-pressure chronic ulcer of left heel and midfoot with other specified severity: Secondary | ICD-10-CM | POA: Diagnosis present

## 2019-04-07 DIAGNOSIS — Z20828 Contact with and (suspected) exposure to other viral communicable diseases: Secondary | ICD-10-CM | POA: Diagnosis present

## 2019-04-07 DIAGNOSIS — K4020 Bilateral inguinal hernia, without obstruction or gangrene, not specified as recurrent: Secondary | ICD-10-CM | POA: Diagnosis present

## 2019-04-07 DIAGNOSIS — K219 Gastro-esophageal reflux disease without esophagitis: Secondary | ICD-10-CM | POA: Diagnosis present

## 2019-04-07 DIAGNOSIS — E1169 Type 2 diabetes mellitus with other specified complication: Secondary | ICD-10-CM | POA: Diagnosis present

## 2019-04-07 DIAGNOSIS — I160 Hypertensive urgency: Secondary | ICD-10-CM | POA: Diagnosis present

## 2019-04-07 DIAGNOSIS — F339 Major depressive disorder, recurrent, unspecified: Secondary | ICD-10-CM | POA: Diagnosis present

## 2019-04-07 LAB — CBC
HCT: 36.4 % — ABNORMAL LOW (ref 39.0–52.0)
Hemoglobin: 12.9 g/dL — ABNORMAL LOW (ref 13.0–17.0)
MCH: 29.1 pg (ref 26.0–34.0)
MCHC: 35.4 g/dL (ref 30.0–36.0)
MCV: 82.2 fL (ref 80.0–100.0)
Platelets: 206 10*3/uL (ref 150–400)
RBC: 4.43 MIL/uL (ref 4.22–5.81)
RDW: 12.4 % (ref 11.5–15.5)
WBC: 7.4 10*3/uL (ref 4.0–10.5)
nRBC: 0 % (ref 0.0–0.2)

## 2019-04-07 LAB — BASIC METABOLIC PANEL
Anion gap: 13 (ref 5–15)
BUN: 25 mg/dL — ABNORMAL HIGH (ref 8–23)
CO2: 25 mmol/L (ref 22–32)
Calcium: 8.6 mg/dL — ABNORMAL LOW (ref 8.9–10.3)
Chloride: 95 mmol/L — ABNORMAL LOW (ref 98–111)
Creatinine, Ser: 2.12 mg/dL — ABNORMAL HIGH (ref 0.61–1.24)
GFR calc Af Amer: 35 mL/min — ABNORMAL LOW (ref >60)
GFR calc non Af Amer: 30 mL/min — ABNORMAL LOW (ref >60)
Glucose, Bld: 280 mg/dL — ABNORMAL HIGH (ref 70–99)
Potassium: 3.7 mmol/L (ref 3.5–5.1)
Sodium: 133 mmol/L — ABNORMAL LOW (ref 135–145)

## 2019-04-07 LAB — URINALYSIS, ROUTINE W REFLEX MICROSCOPIC
Bilirubin Urine: NEGATIVE
Glucose, UA: >=500 mg/dL — AB
Ketones, ur: 5 mg/dL — AB
Leukocytes,Ua: NEGATIVE
Nitrite: NEGATIVE
Protein, ur: 100 mg/dL — AB
Specific Gravity, Urine: 1.009 (ref 1.005–1.030)
pH: 5 (ref 5.0–8.0)

## 2019-04-07 LAB — GLUCOSE, CAPILLARY
Glucose-Capillary: 283 mg/dL — ABNORMAL HIGH (ref 70–99)
Glucose-Capillary: 239 mg/dL — ABNORMAL HIGH (ref 70–99)
Glucose-Capillary: 234 mg/dL — ABNORMAL HIGH (ref 70–99)
Glucose-Capillary: 146 mg/dL — ABNORMAL HIGH (ref 70–99)
Glucose-Capillary: 88 mg/dL (ref 70–99)

## 2019-04-07 LAB — HEMOGLOBIN A1C
Hgb A1c MFr Bld: 12 % — ABNORMAL HIGH (ref 4.8–5.6)
Mean Plasma Glucose: 297.7 mg/dL

## 2019-04-07 LAB — SARS CORONAVIRUS 2 (TAT 6-24 HRS): SARS Coronavirus 2: NEGATIVE

## 2019-04-07 MED ORDER — ONDANSETRON HCL 4 MG/2ML IJ SOLN
4.0000 mg | Freq: Four times a day (QID) | INTRAMUSCULAR | Status: DC | PRN
  Administered 2019-04-07: 4 mg via INTRAVENOUS
  Filled 2019-04-07: qty 2

## 2019-04-07 MED ORDER — ADULT MULTIVITAMIN W/MINERALS CH
1.0000 | ORAL_TABLET | Freq: Every day | ORAL | Status: DC
  Administered 2019-04-07 – 2019-04-08 (×2): 1 via ORAL
  Filled 2019-04-07 (×2): qty 1

## 2019-04-07 MED ORDER — TAMSULOSIN HCL 0.4 MG PO CAPS: 0.4000 mg | ORAL_CAPSULE | Freq: Every day | ORAL | Status: DC

## 2019-04-07 MED ORDER — VENLAFAXINE HCL ER 75 MG PO CP24
75.0000 mg | ORAL_CAPSULE | Freq: Every day | ORAL | Status: DC
  Administered 2019-04-07 – 2019-04-08 (×2): 75 mg via ORAL
  Filled 2019-04-07 (×2): qty 1

## 2019-04-07 MED ORDER — SODIUM CHLORIDE 0.9 % IV SOLN: INTRAVENOUS | Status: DC

## 2019-04-07 MED ORDER — ASPIRIN EC 81 MG PO TBEC
81.0000 mg | DELAYED_RELEASE_TABLET | Freq: Every day | ORAL | Status: DC
  Administered 2019-04-07 (×2): 81 mg via ORAL
  Filled 2019-04-07 (×2): qty 1

## 2019-04-07 MED ORDER — PANTOPRAZOLE SODIUM 40 MG IV SOLR
40.0000 mg | INTRAVENOUS | Status: DC
  Administered 2019-04-07 – 2019-04-08 (×2): 40 mg via INTRAVENOUS
  Filled 2019-04-07 (×2): qty 40

## 2019-04-07 MED ORDER — METOCLOPRAMIDE HCL 5 MG/ML IJ SOLN
5.0000 mg | Freq: Three times a day (TID) | INTRAMUSCULAR | Status: DC
  Administered 2019-04-07 – 2019-04-08 (×2): 5 mg via INTRAVENOUS
  Filled 2019-04-07 (×2): qty 2

## 2019-04-07 MED ORDER — GLUCERNA SHAKE PO LIQD
237.0000 mL | Freq: Three times a day (TID) | ORAL | Status: DC
  Administered 2019-04-07 – 2019-04-08 (×2): 237 mL via ORAL

## 2019-04-07 MED ORDER — INSULIN ASPART 100 UNIT/ML ~~LOC~~ SOLN
0.0000 [IU] | Freq: Three times a day (TID) | SUBCUTANEOUS | Status: DC
  Administered 2019-04-07: 5 [IU] via SUBCUTANEOUS
  Administered 2019-04-07: 2 [IU] via SUBCUTANEOUS
  Administered 2019-04-07: 8 [IU] via SUBCUTANEOUS
  Administered 2019-04-08: 11 [IU] via SUBCUTANEOUS

## 2019-04-07 MED ORDER — ATENOLOL 50 MG PO TABS
50.0000 mg | ORAL_TABLET | Freq: Every day | ORAL | Status: DC
  Administered 2019-04-07 – 2019-04-08 (×2): 50 mg via ORAL
  Filled 2019-04-07 (×2): qty 1

## 2019-04-07 MED ORDER — DOXAZOSIN MESYLATE 4 MG PO TABS
4.0000 mg | ORAL_TABLET | Freq: Every day | ORAL | Status: DC
  Administered 2019-04-07 (×2): 4 mg via ORAL
  Filled 2019-04-07 (×4): qty 1

## 2019-04-07 MED ORDER — PROMETHAZINE HCL 25 MG/ML IJ SOLN: 6.2500 mg | Freq: Four times a day (QID) | INTRAMUSCULAR | Status: DC | PRN

## 2019-04-07 MED ORDER — MEMANTINE HCL 10 MG PO TABS
10.0000 mg | ORAL_TABLET | Freq: Two times a day (BID) | ORAL | Status: DC
  Administered 2019-04-07 – 2019-04-08 (×4): 10 mg via ORAL
  Filled 2019-04-07 (×6): qty 1

## 2019-04-07 MED ORDER — PANTOPRAZOLE SODIUM 40 MG PO TBEC: 40.0000 mg | DELAYED_RELEASE_TABLET | Freq: Every day | ORAL | Status: DC

## 2019-04-07 MED ORDER — LOPERAMIDE HCL 2 MG PO CAPS
4.0000 mg | ORAL_CAPSULE | Freq: Once | ORAL | Status: AC
  Administered 2019-04-07: 21:00:00 4 mg via ORAL
  Filled 2019-04-07: qty 2

## 2019-04-07 MED ORDER — JUVEN PO PACK
1.0000 | PACK | Freq: Two times a day (BID) | ORAL | Status: DC
  Administered 2019-04-08: 1 via ORAL
  Filled 2019-04-07 (×2): qty 1

## 2019-04-07 MED ORDER — FINASTERIDE 5 MG PO TABS
5.0000 mg | ORAL_TABLET | Freq: Every day | ORAL | Status: DC
  Administered 2019-04-07 – 2019-04-08 (×2): 5 mg via ORAL
  Filled 2019-04-07 (×2): qty 1

## 2019-04-07 MED ORDER — ENOXAPARIN SODIUM 40 MG/0.4ML ~~LOC~~ SOLN
40.0000 mg | SUBCUTANEOUS | Status: DC
  Administered 2019-04-07: 40 mg via SUBCUTANEOUS
  Filled 2019-04-07: qty 0.4

## 2019-04-07 MED ORDER — AMPICILLIN 500 MG PO CAPS
500.0000 mg | ORAL_CAPSULE | Freq: Every day | ORAL | Status: DC
  Administered 2019-04-07 – 2019-04-08 (×2): 500 mg via ORAL
  Filled 2019-04-07 (×2): qty 1

## 2019-04-07 MED ORDER — INSULIN ASPART 100 UNIT/ML ~~LOC~~ SOLN
3.0000 [IU] | Freq: Three times a day (TID) | SUBCUTANEOUS | Status: DC
  Administered 2019-04-08: 3 [IU] via SUBCUTANEOUS

## 2019-04-07 MED ORDER — FLUCONAZOLE 200 MG PO TABS
200.0000 mg | ORAL_TABLET | Freq: Every day | ORAL | Status: DC
  Administered 2019-04-07 – 2019-04-08 (×2): 200 mg via ORAL
  Filled 2019-04-07 (×3): qty 1

## 2019-04-07 MED ORDER — VALACYCLOVIR HCL 500 MG PO TABS
1000.0000 mg | ORAL_TABLET | Freq: Three times a day (TID) | ORAL | Status: DC
  Administered 2019-04-07 – 2019-04-08 (×4): 1000 mg via ORAL
  Filled 2019-04-07 (×4): qty 2

## 2019-04-07 MED ORDER — CHLORHEXIDINE GLUCONATE CLOTH 2 % EX PADS
6.0000 | MEDICATED_PAD | Freq: Every day | CUTANEOUS | Status: DC
  Administered 2019-04-07 – 2019-04-08 (×2): 6 via TOPICAL

## 2019-04-07 MED ORDER — DONEPEZIL HCL 10 MG PO TABS
10.0000 mg | ORAL_TABLET | Freq: Every day | ORAL | Status: DC
  Administered 2019-04-07 (×2): 10 mg via ORAL
  Filled 2019-04-07 (×3): qty 1

## 2019-04-07 MED ORDER — SIMVASTATIN 20 MG PO TABS
40.0000 mg | ORAL_TABLET | Freq: Every day | ORAL | Status: DC
  Administered 2019-04-07 (×2): 40 mg via ORAL
  Filled 2019-04-07 (×2): qty 2

## 2019-04-07 MED ORDER — VENLAFAXINE HCL ER 150 MG PO CP24
150.0000 mg | ORAL_CAPSULE | Freq: Every day | ORAL | Status: DC
  Administered 2019-04-07 (×2): 150 mg via ORAL
  Filled 2019-04-07 (×3): qty 1

## 2019-04-07 NOTE — Progress Notes (Signed)
Notified Dr Silas [place] that pt is requesting med for diarrhea. He has had 7 stools since MN. Takes immodium at home. Will continue to monitor patient's status.

## 2019-04-07 NOTE — Progress Notes (Signed)
Pt arrived to the unit. VS WDL. CBG 303. Skin assessment completed.

## 2019-04-07 NOTE — Progress Notes (Signed)
73 year old gentleman prior history of type 2 diabetes, stage III CKD, obstructive sleep apnea not on CPAP, hypertension presents with nausea vomiting diarrhea for a few days.  On arrival to ED he was hypertensive.  CT of the abdomen showed bilateral nonobstructive renal calculi but without any intra-abdominal abnormalities.  Patient also had urinary retention and urinalysis showing budding yeast.  He was started on fluconazole and Foley catheter placed.  He was admitted to [facility] for evaluation of nausea vomiting and urinary retention.   Patient seen and examined at bedside, patient reports he became nauseated after eating breakfast and threw up. He denies any dysuria and reports not having any urinary symptoms prior to admission.    Plan   Nausea vomiting Probably secondary to viral gastroenteritis versus gastritis versus gastroparesis as needed Zofran, Phenergan and IV PPI, IV Reglan No acute events on CT abdomen pelvis.     Urinary retention of unclear etiology probably secondary to BPH Continue with Proscar Foley catheter placed in ED recommend voiding trial prior to discharge.  On fluconazole for budding yeast seen on UA.  Urine cultures are pending.  Consider urology consult if voiding trial fails.    Hypertensive urgency Resolved.    AKI on stage III CKD probably secondary to dehydration and poor oral intake Continue with IV fluids.    Mild hyponatremia probably secondary to dehydration.  Continue    Type 2 diabetes mellitus Uncontrolled with hyperglycemia and stage III CKD Added NovoLog 3 units 3 times daily AC to sliding scale insulin.    She was admitted by Dr. Royal Piedra earlier this morning please see her note for detailed H&P.  Hosie Poisson, MD

## 2019-04-07 NOTE — Plan of Care (Signed)
  Problem: Education: Goal: Knowledge of General Education information will improve Description: Including pain rating scale, medication(s)/side effects and non-pharmacologic comfort measures Outcome: Progressing   Problem: Activity: Goal: Risk for activity intolerance will decrease Outcome: Progressing   Problem: Coping: Goal: Level of anxiety will decrease Outcome: Progressing   

## 2019-04-07 NOTE — Consult Note (Signed)
[facility] Nurse Consult Note: Reason for Consult: Patient has been followed by the outpatient wound care center in [facility], [facility])  [place] for this chronic, slowly healing wound, full thickness at the left foot, 5th digit.  He was seen by Marlena Clipper, DPM on 12/15 for evaluation prior to fitting of orthotic for left foot, foot drop. Wound type: neuropathic Pressure Injury POA: Yes/No/NA Measurement: Per MD visit (podiatry) on   0.7cm x 0.9cm x 0.2cm Wound bed: pink Drainage (amount, consistency, odor) small amount serous Periwound: intact Dressing procedure/placement/frequency: I will provide Nursing staff with guidance via the Orders for a topical antimicrobial dressing (Aquacel Ag+) that can be applied daily while in house.  Patient to follow up with the outpatient wound care center as directed post discharge and with the orthotist for his orthotic.  [place] nursing team will not follow, but will remain available to this patient, the nursing and medical teams.  Please re-consult if needed. Thanks, Maudie Flakes, MSN, RN, [place], Arther Abbott  Pager# [phone]

## 2019-04-07 NOTE — Progress Notes (Signed)
Initial Nutrition Assessment  DOCUMENTATION CODES:   Not applicable  INTERVENTION:  -Glucerna Shake po TID, each supplement provides 220 kcal and 10 grams of protein  -1 packet Juven BID, each packet provides 95 calories, 2.5 grams of protein (collagen), and 9.8 grams of carbohydrate (3 grams sugar); also contains 7 grams of L-arginine and L-glutamine, 300 mg vitamin C, 15 mg vitamin E, 1.2 mcg vitamin B-12, 9.5 mg zinc, 200 mg calcium, and 1.5 g  Calcium Beta-hydroxy-Beta-methylbutyrate to support wound healing  MVI with minerals daily   NUTRITION DIAGNOSIS:   Inadequate oral intake related to nausea, vomiting as evidenced by per patient/family report(decreased po intake 2 days prior to admission; slow healing neuropathic wound to left foot).  GOAL:   Patient will meet greater than or equal to 90% of their needs    MONITOR:   PO intake, Supplement acceptance, Labs, I & O's, Weight trends, Skin  REASON FOR ASSESSMENT:   Malnutrition Screening Tool    ASSESSMENT:  RD working remotely.  73 year old male with medical history significant for T2DM, CKD3, OSA not on CPAP, HTN, hypogonadism on testosterone injections who presents with nausea and vomiting for the past 2 days, reports no urinary output in 3 days, decreased po intake, and bowel incontinence with coughing. In ED no significant findings on CT abd/pelvis; bladder scan showed 600cc, foley catheter placed, budding yeast on uranalysis and admitted for further evaluation.  Patient eating 100% of 3 recorded meals this admission. Per notes, pt reports that he became nauseated after eating breakfast and vomited. Will provide Glucerna Shake TID to aid with calorie/protein needs.   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 RD will provide Juven to support wound healing.   Current wt 205.04 lbs Weight history reviewed, pt noted with  insignificant 5.94 lb wt loss in the past 3 months.   Medications reviewed and include: Diflucan, SS novolog, Protonix, Valtrex  Labs: CBGs 146-283 x 24 hrs, Na 133 (L), BUN 25 (H), Cr 2.12 (H)  NUTRITION - FOCUSED PHYSICAL EXAM: Unable to complete at this time, RD working remotely.  Diet Order:   Diet Order            Diet heart healthy/carb modified Room service appropriate? Yes; Fluid consistency: Thin  Diet effective now              EDUCATION NEEDS:   No education needs have been identified at this time  Skin:  Skin Assessment: Reviewed RN Assessment(wound;cellulitis;left foot)  Last BM:  12/26 ttype 7)  Height:   Ht Readings from Last 1 Encounters:  04/07/19 5' 11.5" (1.816 m)    Weight:   Wt Readings from Last 1 Encounters:  04/07/19 93.2 kg    Ideal Body Weight:  79.5 kg  BMI:  Body mass index is 28.26 kg/m.  Estimated Nutritional Needs:   Kcal:  1900-2100  Protein:  95-105  Fluid:  >/= 1.9 L/day   Lajuan Lines, RD, LDN Clinical Nutrition [place] Telephone [phone] After Hours/Weekend Pager: [phone]

## 2019-04-07 NOTE — H&P (Signed)
History and Physical    [name]:[id number] DOB: September 15, 1945 DOA: 04/06/2019  PCP: [name], MD  Patient coming from: Home, pt lives alone  I have personally briefly reviewed patient's old medical records in [place]  Chief Complaint: nausea and vomiting   HPI: [name] is a 73 y.o. male with medical history significant of Type 2 insulin-dependent diabetes, CKD stage III, OSA on not CPAP, hypertension, hypogonadism on testosterone injections who presents with nausea and vomiting for the past 2 days. Has had decrease PO intake. Had bowel incontinence with coughing.  Missed his insulin and other mediations. He went to urgent care yesterday and was told to go to ED but refused.  No fever. No abdominal pain. Has also not urinated for 3 days. No hx of BPH. States his PSA has been normal in the past.  States his wife past away 2 years ago so has decrease appetite around the holidays.  ED Course: He was afebrile and hypertensive up to 190/95 on room air.  No leukocytosis or anemia. Sodium of 127, K of 3.8, Glucose of 310, creatinine of 1.89. Troponin of 14 and 15. Negative UA.  CT abdomen and pelvis showed small bilateral nonobstructing renal calculi, right renal cyst, prostatic enlargement, bilateral inguinal hernia containing fat.  No other intra-abdominal abnormalities.  Review of Systems: Constitutional: No Weight Change, No Fever ENT/Mouth: No sore throat, No Rhinorrhea Eyes:No Vision Changes Cardiovascular: No Chest Pain, no SOB Respiratory: No Cough, No Sputum, No Wheezing, no Dyspnea  Gastrointestinal: + Nausea, +Vomiting, + Diarrhea, No Constipation, No Pain Genitourinary: urinary retention Musculoskeletal: No Arthralgias, No Myalgias Skin: No Skin Lesions, No Pruritus, Neuro: no Weakness, No Numbness Psych:  + decrease appetite Heme/Lymph: No Bruising, No Bleeding  Past Medical History:  Diagnosis Date  . Allergic rhinitis due to pollen   .  Chronic kidney disease, stage III (moderate) 12/19/2012  . Depression   . Diabetes mellitus ([place])   . Diverticulosis   . Erectile dysfunction associated with type 2 diabetes mellitus ([place]) 05/28/2015  . Former very heavy cigarette smoker (more than 40 per day) 10/07/2014  . GERD (gastroesophageal reflux disease)   . Gout   . Hyperlipidemia   . Hypertension   . Hypogonadism male 06/14/2012  . Iron deficiency   . Memory loss   . Osteoarthritis   . Personal history of colonic polyps    1996    Past Surgical History:  Procedure Laterality Date  . CARDIAC CATHETERIZATION  11/2011   [facility]  . CHOLECYSTECTOMY  1980  . ESOPHAGOGASTRODUODENOSCOPY (EGD) WITH PROPOFOL N/A 09/27/2017   Procedure: ESOPHAGOGASTRODUODENOSCOPY (EGD) WITH PROPOFOL;  Surgeon: Lin Landsman, MD;  Location: [facility];  Service: Gastroenterology;  Laterality: N/A;  . EYE SURGERY Right 07/29/2016   cataract - Dr. Manuella Ghazi  . EYE SURGERY Left 08/23/2106   cataract - Dr. Manuella Ghazi  . TOTAL HIP ARTHROPLASTY  2009   Dr. Gladstone Lighter  . TOTAL HIP ARTHROPLASTY       reports that he quit smoking about 18 years ago. His smoking use included cigarettes. He has a 105.00 pack-year smoking history. He has never used smokeless tobacco. He reports that he does not drink alcohol or use drugs.  Allergies  Allergen Reactions  . Tetracycline Itching and Rash    Family History  Problem Relation Age of Onset  . Diabetes Mother   . Alzheimer's disease Mother   . Diabetes Father   . Lung cancer Father  Age 59  . Stroke Father   . Heart attack Father   . Lung cancer Sister        lung  . Heart disease Maternal Grandfather   . COPD Sister   . Heart attack Sister   . Heart disease Sister      Prior to Admission medications   Medication Sig Start Date End Date Taking? Authorizing Provider  acetaminophen (QC ACETAMINOPHEN 8HR ARTH PAIN) 650 MG CR tablet Take 650 mg by mouth every 8 (eight) hours as needed for pain.   Yes  [provider]  ampicillin (PRINCIPEN) 500 MG capsule Take 500 mg by mouth daily.   Yes [provider]  Ascorbic Acid (VITAMIN C) 1000 MG tablet Take 1,000 mg by mouth daily.   Yes [provider]  aspirin EC 81 MG tablet Take 81 mg by mouth at bedtime.   Yes [provider]  atenolol (TENORMIN) 50 MG tablet TAKE 1 TABLET BY MOUTH EVERY DAY Patient taking differently: Take 50 mg by mouth daily.  12/22/18  Yes Copland, Frederico Hamman, MD  cholecalciferol (VITAMIN D) 1000 UNITS tablet Take 1,000 Units by mouth daily.     Yes [provider]  colchicine 0.6 MG tablet Take 1 tablet (0.6 mg total) by mouth 2 (two) times daily. 07/12/18  Yes Copland, Frederico Hamman, MD  donepezil (ARICEPT) 10 MG tablet Take 1 tablet (10 mg total) by mouth at bedtime. 05/18/18  Yes Copland, Frederico Hamman, MD  doxazosin (CARDURA) 8 MG tablet TAKE ONE-HALF TABLET AT BEDTIME Patient taking differently: Take 4 mg by mouth at bedtime.  01/26/19  Yes Copland, Frederico Hamman, MD  finasteride (PROSCAR) 5 MG tablet Take 1 tablet (5 mg total) by mouth daily. 01/16/18  Yes Copland, Frederico Hamman, MD  fluticasone (FLONASE) 50 MCG/ACT nasal spray PLACE 2 SPRAYS IN EACH NOSTRIL DAILY Patient taking differently: Place 2 sprays into both nostrils daily.  01/26/19  Yes Copland, Frederico Hamman, MD  ibuprofen (ADVIL,MOTRIN) 200 MG tablet Take 200-400 mg by mouth every 6 (six) hours as needed for headache (pain).   Yes [provider]  indomethacin (INDOCIN) 50 MG capsule Take 1 capsule (50 mg total) by mouth 2 (two) times daily with a meal. 07/12/18  Yes Copland, Frederico Hamman, MD  insulin NPH Human (HUMULIN N,NOVOLIN N) 100 UNIT/ML injection Inject 0.25 mLs (25 Units total) into the skin 2 (two) times daily before a meal. 10/14/17  Yes Ria Bush, MD  insulin regular (NOVOLIN R,HUMULIN R) 100 units/mL injection Inject 0.2 mLs (20 Units total) into the skin 2 (two) times daily before a meal. 10/14/17  Yes Ria Bush, MD    lisinopril (ZESTRIL) 10 MG tablet TAKE 1 TABLET BY MOUTH EVERY DAY Patient taking differently: Take 10 mg by mouth daily.  01/26/19  Yes Copland, Frederico Hamman, MD  memantine (NAMENDA) 10 MG tablet Take 1 tablet (10 mg total) by mouth 2 (two) times daily. 05/18/18  Yes Copland, Frederico Hamman, MD  omeprazole (PRILOSEC) 40 MG capsule Take 1 capsule (40 mg total) by mouth 2 (two) times daily before a meal. 08/21/18 04/06/28 Yes Copland, Frederico Hamman, MD  ondansetron (ZOFRAN-ODT) 8 MG disintegrating tablet Take 1 tablet (8 mg total) by mouth every 8 (eight) hours as needed for nausea. 04/05/19  Yes Robyn Haber, MD  Semaglutide,0.25 or 0.5MG /DOS, (OZEMPIC, 0.25 OR 0.5 MG/DOSE,) 2 MG/1.5ML SOPN Inject 0.5 mg into the skin once a week. 03/20/19  Yes Philemon Kingdom, MD  sildenafil (REVATIO) 20 MG tablet Generic Revatio / Sildanefil 20 mg. 2 -  5 tabs 30 mins prior to intercourse. Patient taking differently: Take 20-100 mg by mouth as needed (prior to intercourse).  04/13/18  Yes Copland, Frederico Hamman, MD  simvastatin (ZOCOR) 40 MG tablet Take 1 tablet (40 mg total) by mouth at bedtime. 05/18/18  Yes Copland, Frederico Hamman, MD  testosterone cypionate (DEPOTESTOSTERONE CYPIONATE) 200 MG/ML injection Inject 0.75 mLs (150 mg total) into the muscle every 14 (fourteen) days. 03/12/19  Yes Copland, Frederico Hamman, MD  valACYclovir (VALTREX) 1000 MG tablet Take 1,000 mg by mouth 3 (three) times daily. 08/17/18  Yes [provider]  venlafaxine XR (EFFEXOR-XR) 75 MG 24 hr capsule TAKE 1 CAPSULE BY MOUTH EVERY MORNING AND 2 CAPSULES AT BEDTIME Patient taking differently: Take 75-150 mg by mouth See admin instructions. TAKE 1 CAPSULE BY MOUTH EVERY MORNING AND 2 CAPSULES AT BEDTIME 01/27/19  Yes Copland, Frederico Hamman, MD  ondansetron (ZOFRAN) 4 MG tablet Take 1 tablet (4 mg total) by mouth every 8 (eight) hours as needed for nausea or vomiting. Patient not taking: Reported on 04/07/2019 08/21/18   [name], MD    Physical Exam: Vitals:    04/06/19 2100 04/06/19 2115 04/06/19 2330 04/07/19 0015  BP: (!) 190/95 (!) 182/93 (!) 179/92 (!) 168/86  Pulse:    (!) 112  Resp:   17 18  Temp:      TempSrc:      SpO2:   98% 97%    Constitutional: NAD, calm, comfortable, non-toxic appearing laying in bed Vitals:   04/06/19 2100 04/06/19 2115 04/06/19 2330 04/07/19 0015  BP: (!) 190/95 (!) 182/93 (!) 179/92 (!) 168/86  Pulse:    (!) 112  Resp:   17 18  Temp:      TempSrc:      SpO2:   98% 97%   Eyes: PERRL, lids and conjunctivae normal ENMT: Mucous membranes are moist.  Neck: normal, supple Respiratory: clear to auscultation bilaterally, no wheezing, no crackles. Normal respiratory effort. Cardiovascular: Regular rate and rhythm, no murmurs / rubs / gallops. No extremity edema. Abdomen: no tenderness,  No hepatosplenomegaly. Bowel sounds positive.  GU: foley catheter in place with 300cc of UOP Musculoskeletal: no clubbing / cyanosis. No joint deformity upper and lower extremities.  Normal muscle tone.  Skin: healed ulcer on left plantar foot with surrounding thickened skin Neurologic: CN 2-12 grossly intact. Sensation intact. Strength 5/5 in all 4.  Psychiatric: Normal judgment and insight. Alert and oriented x 3. Normal mood.    Labs on Admission: I have personally reviewed following labs and imaging studies  CBC: Recent Labs  Lab 04/06/19 1322  WBC 7.7  HGB 14.8  HCT 41.7  MCV 81.4  PLT 932   Basic Metabolic Panel: Recent Labs  Lab 04/06/19 1322  NA 127*  K 3.8  CL 88*  CO2 25  GLUCOSE 310*  BUN 20  CREATININE 1.89*  CALCIUM 9.4   GFR: CrCl cannot be calculated (Unknown ideal weight.). Liver Function Tests: Recent Labs  Lab 04/06/19 1322  AST 13*  ALT 13  ALKPHOS 91  BILITOT 1.4*  PROT 7.3  ALBUMIN 3.6   No results for input(s): LIPASE, AMYLASE in the last 168 hours. No results for input(s): AMMONIA in the last 168 hours. Coagulation Profile: No results for input(s): INR, PROTIME in the  last 168 hours. Cardiac Enzymes: No results for input(s): CKTOTAL, CKMB, CKMBINDEX, TROPONINI in the last 168 hours. BNP (last 3 results) No results for input(s): PROBNP in the last 8760 hours. HbA1C: No results for  input(s): HGBA1C in the last 72 hours. CBG: Recent Labs  Lab 04/05/19 1254 04/06/19 1305 04/06/19 2002  GLUCAP 370*  370* 300* 210*   Lipid Profile: No results for input(s): CHOL, HDL, LDLCALC, TRIG, CHOLHDL, LDLDIRECT in the last 72 hours. Thyroid Function Tests: No results for input(s): TSH, T4TOTAL, FREET4, T3FREE, THYROIDAB in the last 72 hours. Anemia Panel: No results for input(s): VITAMINB12, FOLATE, FERRITIN, TIBC, IRON, RETICCTPCT in the last 72 hours. Urine analysis:    Component Value Date/Time   COLORURINE YELLOW 04/06/2019 2347   APPEARANCEUR HAZY (A) 04/06/2019 2347   LABSPEC 1.009 04/06/2019 2347   PHURINE 5.0 04/06/2019 2347   GLUCOSEU >=500 (A) 04/06/2019 2347   HGBUR LARGE (A) 04/06/2019 2347   BILIRUBINUR NEGATIVE 04/06/2019 2347   BILIRUBINUR 1+ 09/10/2014 1615   KETONESUR 5 (A) 04/06/2019 2347   PROTEINUR 100 (A) 04/06/2019 2347   UROBILINOGEN 0.2 12/05/2014 2146   NITRITE NEGATIVE 04/06/2019 2347   LEUKOCYTESUR NEGATIVE 04/06/2019 2347    Radiological Exams on Admission: CT ABDOMEN PELVIS WO CONTRAST  Result Date: 04/06/2019 CLINICAL DATA:  Abdominal pain, vomiting, unable to tolerate p.o. intake for 2-3 days, history stage III chronic kidney disease, diabetes mellitus, hypertension EXAM: CT ABDOMEN AND PELVIS WITHOUT CONTRAST TECHNIQUE: Multidetector CT imaging of the abdomen and pelvis was performed following the standard protocol without IV contrast. Sagittal and coronal MPR images reconstructed from axial data set. No oral contrast administered. COMPARISON:  None FINDINGS: Lower chest: Minimal infiltrate or scarring at RIGHT lung base. Hepatobiliary: Gallbladder surgically absent. Liver unremarkable. Pancreas: Normal appearance Spleen:  Normal appearance Adrenals/Urinary Tract: Adrenal glands normal appearance. Tiny nonobstructing calculi in both kidneys. RIGHT renal cyst laterally at mid inferior kidney 2.9 x 2.7 cm. No additional renal mass no ureteral calcification or dilatation. Bladder unremarkable. Stomach/Bowel: Normal appendix. Diverticulosis of ascending colon without evidence of diverticulitis. Duodenal diverticulum. Stomach and bowel loops otherwise normal appearance. Vascular/Lymphatic: Atherosclerotic calcifications aorta, iliac arteries, coronary arteries. No adenopathy. Reproductive: Prostatic enlargement with gland measuring 5.0 x 3.8 cm Other: Small BILATERAL inguinal hernias containing fat. No free air or free fluid. Musculoskeletal: Beam hardening artifacts in pelvis from RIGHT hip prosthesis. No acute osseous findings. IMPRESSION: Small BILATERAL nonobstructing renal calculi. RIGHT renal cyst 2.9 x 2.7 cm. Prostatic enlargement. Small BILATERAL inguinal hernias containing fat. Diverticulosis of ascending colon without evidence of diverticulitis. No acute intra-abdominal or intrapelvic abnormalities. Aortic Atherosclerosis (ICD10-I70.0). Electronically Signed   By: Lavonia Dana M.D.   On: 04/06/2019 21:47      Assessment/Plan  Nausea/vomiting could be due to gastroenteritis vs urinary retention PRN Zofran No significant findings on CT abd/pelvis  Urinary retention/Candiduria Bladder scan showed 600cc-foley catheter placed CT abdomen showed prostatic enlargement - continue Proscar Budding yeast seen on UA- will start daily fluconazole Consult urology for further rec on treatment of yeast and length of catheter retention  Hypertension Elevated from missing mediation  Continue atenolol and doxazosin Hold Lisinopril due to AKI  Hyperglycemia with insulin dependent Type 2 diabetes Normally takes insulin with meals  BG around 200 after fluids Will do moderate SSI for now  Hyponatremia Correct Na of 130 Due  to hypovolemia  Follow after fluids  Acute on chronic stage III Received 2L of fluid in the ED Avoid nephrotoxic agent. Hold home indomethacin.  Left plantar foot ulcer No signs of infection sees podiatry outpatient- continue follow up  Dementia Continue donepezil, memantine and Effexor   Chronic acne/dermatitis Continue ampicillin Follows with dermatology outpatient  DVT prophylaxis: Lovenox Code Status: Full Family Communication: Plan discussed with patient at bedside  disposition Plan: Home with at least 2 midnight stays  Consults called:  Admission status: inpatient   Adwoa Axe T Tyran Huser DO [facility]   If 7PM-7AM, please contact night-coverage [email or web address] Password [facility]  04/07/2019, 12:37 AM

## 2019-04-07 NOTE — Plan of Care (Signed)

## 2019-04-08 LAB — CBC
HCT: 31.3 % — ABNORMAL LOW (ref 39.0–52.0)
Hemoglobin: 10.7 g/dL — ABNORMAL LOW (ref 13.0–17.0)
MCH: 28.8 pg (ref 26.0–34.0)
MCHC: 34.2 g/dL (ref 30.0–36.0)
MCV: 84.1 fL (ref 80.0–100.0)
Platelets: 164 10*3/uL (ref 150–400)
RBC: 3.72 MIL/uL — ABNORMAL LOW (ref 4.22–5.81)
RDW: 12.7 % (ref 11.5–15.5)
WBC: 7.5 10*3/uL (ref 4.0–10.5)
nRBC: 0 % (ref 0.0–0.2)

## 2019-04-08 LAB — BASIC METABOLIC PANEL
Anion gap: 8 (ref 5–15)
BUN: 30 mg/dL — ABNORMAL HIGH (ref 8–23)
CO2: 24 mmol/L (ref 22–32)
Calcium: 8.3 mg/dL — ABNORMAL LOW (ref 8.9–10.3)
Chloride: 98 mmol/L (ref 98–111)
Creatinine, Ser: 2.07 mg/dL — ABNORMAL HIGH (ref 0.61–1.24)
GFR calc Af Amer: 36 mL/min — ABNORMAL LOW (ref >60)
GFR calc non Af Amer: 31 mL/min — ABNORMAL LOW (ref >60)
Glucose, Bld: 335 mg/dL — ABNORMAL HIGH (ref 70–99)
Potassium: 3.7 mmol/L (ref 3.5–5.1)
Sodium: 130 mmol/L — ABNORMAL LOW (ref 135–145)

## 2019-04-08 LAB — GLUCOSE, CAPILLARY
Glucose-Capillary: 303 mg/dL — ABNORMAL HIGH (ref 70–99)
Glucose-Capillary: 228 mg/dL — ABNORMAL HIGH (ref 70–99)

## 2019-04-08 LAB — URINE CULTURE: Culture: NO GROWTH

## 2019-04-08 MED ORDER — FLUCONAZOLE 100 MG PO TABS: 100.0000 mg | ORAL_TABLET | Freq: Every day | ORAL | Status: DC

## 2019-04-08 MED ORDER — JUVEN PO PACK: 1.0000 | PACK | Freq: Two times a day (BID) | ORAL | 0 refills | Status: DC

## 2019-04-08 MED ORDER — FLUCONAZOLE 100 MG PO TABS: 100.0000 mg | ORAL_TABLET | Freq: Every day | ORAL | 0 refills | Status: DC

## 2019-04-08 MED ORDER — PANTOPRAZOLE SODIUM 40 MG PO TBEC: 40.0000 mg | DELAYED_RELEASE_TABLET | Freq: Every day | ORAL | Status: DC

## 2019-04-08 MED ORDER — GLUCERNA SHAKE PO LIQD: 237.0000 mL | Freq: Three times a day (TID) | ORAL | 0 refills | Status: DC

## 2019-04-08 NOTE — Progress Notes (Signed)
The patient is receiving Protonix by the intravenous route.  Based on criteria approved by the Pharmacy and [place], the medication is being converted to the equivalent oral dose form.  These criteria include: -No active GI bleeding -Able to tolerate diet of full liquids (or better) or tube feeding -Able to tolerate other medications by the oral or enteral route  If you have any questions about this conversion, please contact the Pharmacy Department (phone [phone]).  Thank you.  Brendolyn Patty, PharmD PGY2 Pharmacy Resident Phone [phone]  04/08/2019   12:02 PM

## 2019-04-08 NOTE — Plan of Care (Signed)

## 2019-04-08 NOTE — Progress Notes (Signed)
Foley pulled and patient given drinks over 2 hours, patient thenurinated and bladder scan showed volume of 24 ml.  MD notified, awaiting discharge orders.

## 2019-04-09 ENCOUNTER — Telehealth: Payer: Self-pay

## 2019-04-09 ENCOUNTER — Other Ambulatory Visit: Payer: Self-pay

## 2019-04-09 ENCOUNTER — Encounter: Payer: Medicare Other | Admitting: Internal Medicine

## 2019-04-09 DIAGNOSIS — Z87891 Personal history of nicotine dependence: Secondary | ICD-10-CM | POA: Diagnosis not present

## 2019-04-09 DIAGNOSIS — L97522 Non-pressure chronic ulcer of other part of left foot with fat layer exposed: Secondary | ICD-10-CM | POA: Diagnosis not present

## 2019-04-09 DIAGNOSIS — M1990 Unspecified osteoarthritis, unspecified site: Secondary | ICD-10-CM | POA: Diagnosis not present

## 2019-04-09 DIAGNOSIS — S61511A Laceration without foreign body of right wrist, initial encounter: Secondary | ICD-10-CM | POA: Diagnosis not present

## 2019-04-09 DIAGNOSIS — Z881 Allergy status to other antibiotic agents status: Secondary | ICD-10-CM | POA: Diagnosis not present

## 2019-04-09 DIAGNOSIS — E1142 Type 2 diabetes mellitus with diabetic polyneuropathy: Secondary | ICD-10-CM | POA: Diagnosis not present

## 2019-04-09 DIAGNOSIS — E1151 Type 2 diabetes mellitus with diabetic peripheral angiopathy without gangrene: Secondary | ICD-10-CM | POA: Diagnosis not present

## 2019-04-09 DIAGNOSIS — Z794 Long term (current) use of insulin: Secondary | ICD-10-CM | POA: Diagnosis not present

## 2019-04-09 DIAGNOSIS — M21372 Foot drop, left foot: Secondary | ICD-10-CM | POA: Diagnosis not present

## 2019-04-09 DIAGNOSIS — M109 Gout, unspecified: Secondary | ICD-10-CM | POA: Diagnosis not present

## 2019-04-09 DIAGNOSIS — Z833 Family history of diabetes mellitus: Secondary | ICD-10-CM | POA: Diagnosis not present

## 2019-04-09 DIAGNOSIS — E11621 Type 2 diabetes mellitus with foot ulcer: Secondary | ICD-10-CM | POA: Diagnosis not present

## 2019-04-09 DIAGNOSIS — Z8249 Family history of ischemic heart disease and other diseases of the circulatory system: Secondary | ICD-10-CM | POA: Diagnosis not present

## 2019-04-09 DIAGNOSIS — Z809 Family history of malignant neoplasm, unspecified: Secondary | ICD-10-CM | POA: Diagnosis not present

## 2019-04-09 DIAGNOSIS — I10 Essential (primary) hypertension: Secondary | ICD-10-CM | POA: Diagnosis not present

## 2019-04-09 LAB — GLUCOSE, CAPILLARY: Glucose-Capillary: 303 mg/dL — ABNORMAL HIGH (ref 70–99)

## 2019-04-09 NOTE — Telephone Encounter (Signed)
Transition Care Management Follow-up Telephone Call  Date of discharge and from where: 04/08/2019, [name]  How have you been since you were released from the hospital? Patient states that he had a episode last night where he was seeing things that was not there because his blood sugar was around 400. He states that he is feeling much better now and glucose level has returned to normal.   Any questions or concerns? No   Items Reviewed:  Did the pt receive and understand the discharge instructions provided? Yes   Medications obtained and verified? Yes   Any new allergies since your discharge? No   Dietary orders reviewed? Yes  Do you have support at home? Yes   Functional Questionnaire: (I = Independent and D = Dependent) ADLs: I  Bathing/Dressing- I  Meal Prep- I  Eating- I  Maintaining continence- I  Transferring/Ambulation- I  Managing Meds- I  Follow up appointments reviewed:   PCP Hospital f/u appt confirmed? Yes  Scheduled to see [name] on 04/18/2019 @ 10:20 am.  [facility] f/u appt confirmed? N/A   Are transportation arrangements needed? No   If their condition worsens, is the pt aware to call PCP or go to the Emergency Dept.? Yes  Was the patient provided with contact information for the PCP's office or ED? Yes  Was to pt encouraged to call back with questions or concerns? Yes

## 2019-04-09 NOTE — Discharge Summary (Signed)
[facility] Discharge Summary   Patient: [name] SEG:[id number]   PCP: [name], MD DOB: [birth year]   Date of admission: 04/06/2019   Date of discharge: 04/08/2019    Discharge Diagnoses:  Principal diagnosis Viral gastroenteritis   Active Problems:   Essential hypertension   Chronic kidney disease, stage III (moderate)   Uncontrolled type 2 diabetes mellitus with peripheral neuropathy (HCC)   Foot ulcer (HCC)   Mild cognitive impairment   Intractable vomiting with nausea   Hyponatremia   Acute urinary retention   Admitted From: home Disposition:  Home   Recommendations for Outpatient Follow-up:  1. PCP: please follow up in 1 week, may need urology consultation 2. Follow up LABS/TEST:  none  Follow-up Information    Copland, Spencer, MD. Schedule an appointment as soon as possible for a visit in 1 week(s).   Specialties: Family Medicine, Sports Medicine Contact information: [name] [place] [address] [phone]          Diet recommendation: Cardiac diet  Activity: The patient is advised to gradually reintroduce usual activities,as tolerated  Discharge Condition: good  Code Status: Full code   History of present illness: As per the H and P dictated on admission, "BADEN BETSCH is a 73 y.o. male with medical history significant of Type 2 insulin-dependent diabetes, CKD stage III, OSA on not CPAP, hypertension, hypogonadism on testosterone injections who presents with nausea and vomiting for the past 2 days. Has had decrease PO intake. Had bowel incontinence with coughing.  Missed his insulin and other mediations. He went to urgent care yesterday and was told to go to ED but refused.  No fever. No abdominal pain. Has also not urinated for 3 days. No hx of BPH. States his PSA has been normal in the past.  States his wife past away 2 years ago so has decrease appetite around the holidays."  Hospital Course:  Summary of his  active problems in the hospital is as following. Nausea/vomiting could be due to gastroenteritis vs urinary retention PRN Zofran No significant findings on CT abd/pelvis Resolved on its own and pt was able to tolerate oral diet.   Urinary retention/Candiduria Bladder scan showed 600cc-foley catheter placed CT abdomen showed prostatic enlargement - continue Proscar, will hold off on adding flomax. Budding yeast seen on UA- will start daily fluconazole Outpt follow up with urology Already on amp.  Hypertension Elevated from missing mediation  Continue atenolol and doxazosin Hold Lisinopril due to AKI  Hyperglycemia with insulin dependent Type 2 diabetes Normally takes insulin with meals  Resume home meds   Hyponatremia Correct Na of 130 Due to hypovolemia  Resolved   Acute on chronic stage IIIa Received 2L of fluid in the ED Improved and back to baseline.   Left plantar foot ulcer No signs of infection sees podiatry outpatient- continue follow up  Dementia Continue donepezil, memantine and Effexor   Chronic acne/dermatitis Continue ampicillin Follows with dermatology outpatient   Patient was ambulatory without any assistance. On the day of the discharge the patient's vitals were stable, and no other acute medical condition were reported by patient. the patient was felt safe to be discharge at Home with no therapy needed on discharge.  Consultants: none Procedures: noen  DISCHARGE MEDICATION: Allergies as of 04/08/2019      Reactions   Tetracycline Itching, Rash      Medication List    TAKE these medications   ampicillin 500 MG capsule Commonly known  as: PRINCIPEN Take 500 mg by mouth daily.   aspirin EC 81 MG tablet Take 81 mg by mouth at bedtime.   atenolol 50 MG tablet Commonly known as: TENORMIN TAKE 1 TABLET BY MOUTH EVERY DAY   cholecalciferol 1000 units tablet Commonly known as: VITAMIN D Take 1,000 Units by mouth daily.   colchicine  0.6 MG tablet Take 1 tablet (0.6 mg total) by mouth 2 (two) times daily.   donepezil 10 MG tablet Commonly known as: ARICEPT Take 1 tablet (10 mg total) by mouth at bedtime.   doxazosin 8 MG tablet Commonly known as: CARDURA TAKE ONE-HALF TABLET AT BEDTIME What changed: See the new instructions.   feeding supplement (GLUCERNA SHAKE) Liqd Take 237 mLs by mouth 3 (three) times daily between meals.   nutrition supplement (JUVEN) Pack Take 1 packet by mouth 2 (two) times daily between meals.   finasteride 5 MG tablet Commonly known as: PROSCAR Take 1 tablet (5 mg total) by mouth daily.   fluconazole 100 MG tablet Commonly known as: DIFLUCAN Take 1 tablet (100 mg total) by mouth daily.   fluticasone 50 MCG/ACT nasal spray Commonly known as: FLONASE PLACE 2 SPRAYS IN EACH NOSTRIL DAILY What changed:   how much to take  how to take this  when to take this  additional instructions   ibuprofen 200 MG tablet Commonly known as: ADVIL Take 200-400 mg by mouth every 6 (six) hours as needed for headache (pain).   indomethacin 50 MG capsule Commonly known as: INDOCIN Take 1 capsule (50 mg total) by mouth 2 (two) times daily with a meal.   insulin NPH Human 100 UNIT/ML injection Commonly known as: NOVOLIN N Inject 0.25 mLs (25 Units total) into the skin 2 (two) times daily before a meal.   insulin regular 100 units/mL injection Commonly known as: NOVOLIN R Inject 0.2 mLs (20 Units total) into the skin 2 (two) times daily before a meal.   lisinopril 10 MG tablet Commonly known as: ZESTRIL TAKE 1 TABLET BY MOUTH EVERY DAY   memantine 10 MG tablet Commonly known as: NAMENDA Take 1 tablet (10 mg total) by mouth 2 (two) times daily.   omeprazole 40 MG capsule Commonly known as: PRILOSEC Take 1 capsule (40 mg total) by mouth 2 (two) times daily before a meal.   ondansetron 4 MG tablet Commonly known as: Zofran Take 1 tablet (4 mg total) by mouth every 8 (eight) hours as  needed for nausea or vomiting.   ondansetron 8 MG disintegrating tablet Commonly known as: ZOFRAN-ODT Take 1 tablet (8 mg total) by mouth every 8 (eight) hours as needed for nausea.   Ozempic (0.25 or 0.5 MG/DOSE) 2 MG/1.5ML Sopn Generic drug: Semaglutide(0.25 or 0.5MG /DOS) Inject 0.5 mg into the skin once a week.   QC Acetaminophen 8Hr Arth Pain 650 MG CR tablet Generic drug: acetaminophen Take 650 mg by mouth every 8 (eight) hours as needed for pain.   sildenafil 20 MG tablet Commonly known as: Revatio Generic Revatio / Sildanefil 20 mg. 2 - 5 tabs 30 mins prior to intercourse. What changed:   how much to take  how to take this  when to take this  reasons to take this  additional instructions   simvastatin 40 MG tablet Commonly known as: ZOCOR Take 1 tablet (40 mg total) by mouth at bedtime.   testosterone cypionate 200 MG/ML injection Commonly known as: DEPOTESTOSTERONE CYPIONATE Inject 0.75 mLs (150 mg total) into the muscle every 14 (fourteen)  days.   valACYclovir 1000 MG tablet Commonly known as: VALTREX Take 1,000 mg by mouth 3 (three) times daily.   venlafaxine XR 75 MG 24 hr capsule Commonly known as: EFFEXOR-XR TAKE 1 CAPSULE BY MOUTH EVERY MORNING AND 2 CAPSULES AT BEDTIME What changed:   how much to take  how to take this  when to take this   vitamin C 1000 MG tablet Take 1,000 mg by mouth daily.      Allergies  Allergen Reactions   Tetracycline Itching and Rash   Discharge Instructions    Diet - low sodium heart healthy   Complete by: As directed    Increase activity slowly   Complete by: As directed      Discharge Exam: Filed Weights   04/07/19 0015  Weight: 93.2 kg   Vitals:   04/08/19 0247 04/08/19 0747  BP: 137/73 128/62  Pulse: 83 83  Resp:    Temp: 97.7 F (36.5 C) 97.9 F (36.6 C)  SpO2: 98% 98%   General: Appear in no distress, no Rash; Oral Mucosa Clear, moist. no Abnormal Mass Or lumps Cardiovascular: S1 and  S2 Present, no Murmur, Respiratory: normal respiratory effort, Bilateral Air entry present and Clear to Auscultation, no Crackles, no wheezes Abdomen: Bowel Sound present, Soft and no tenderness, no hernia Extremities: no Pedal edema, no calf tenderness Neurology: alert and oriented to time, place, and person affect appropriate.  The results of significant diagnostics from this hospitalization (including imaging, microbiology, ancillary and laboratory) are listed below for reference.    Significant Diagnostic Studies: CT ABDOMEN PELVIS WO CONTRAST  Result Date: 04/06/2019 CLINICAL DATA:  Abdominal pain, vomiting, unable to tolerate p.o. intake for 2-3 days, history stage III chronic kidney disease, diabetes mellitus, hypertension EXAM: CT ABDOMEN AND PELVIS WITHOUT CONTRAST TECHNIQUE: Multidetector CT imaging of the abdomen and pelvis was performed following the standard protocol without IV contrast. Sagittal and coronal MPR images reconstructed from axial data set. No oral contrast administered. COMPARISON:  None FINDINGS: Lower chest: Minimal infiltrate or scarring at RIGHT lung base. Hepatobiliary: Gallbladder surgically absent. Liver unremarkable. Pancreas: Normal appearance Spleen: Normal appearance Adrenals/Urinary Tract: Adrenal glands normal appearance. Tiny nonobstructing calculi in both kidneys. RIGHT renal cyst laterally at mid inferior kidney 2.9 x 2.7 cm. No additional renal mass no ureteral calcification or dilatation. Bladder unremarkable. Stomach/Bowel: Normal appendix. Diverticulosis of ascending colon without evidence of diverticulitis. Duodenal diverticulum. Stomach and bowel loops otherwise normal appearance. Vascular/Lymphatic: Atherosclerotic calcifications aorta, iliac arteries, coronary arteries. No adenopathy. Reproductive: Prostatic enlargement with gland measuring 5.0 x 3.8 cm Other: Small BILATERAL inguinal hernias containing fat. No free air or free fluid. Musculoskeletal:  Beam hardening artifacts in pelvis from RIGHT hip prosthesis. No acute osseous findings. IMPRESSION: Small BILATERAL nonobstructing renal calculi. RIGHT renal cyst 2.9 x 2.7 cm. Prostatic enlargement. Small BILATERAL inguinal hernias containing fat. Diverticulosis of ascending colon without evidence of diverticulitis. No acute intra-abdominal or intrapelvic abnormalities. Aortic Atherosclerosis (ICD10-I70.0). Electronically Signed   By: Lavonia Dana M.D.   On: 04/06/2019 21:47    Microbiology: Recent Results (from the past 240 hour(s))  SARS CORONAVIRUS 2 (TAT 6-24 HRS) Nasopharyngeal Nasopharyngeal Swab     Status: None   Collection Time: 04/06/19 11:15 PM   Specimen: Nasopharyngeal Swab  Result Value Ref Range Status   SARS Coronavirus 2 NEGATIVE NEGATIVE Final    Comment: (NOTE) SARS-CoV-2 target nucleic acids are NOT DETECTED. The SARS-CoV-2 RNA is generally detectable in upper and lower respiratory specimens during  the acute phase of infection. Negative results do not preclude SARS-CoV-2 infection, do not rule out co-infections with other pathogens, and should not be used as the sole basis for treatment or other patient management decisions. Negative results must be combined with clinical observations, patient history, and epidemiological information. The expected result is Negative. Fact Sheet for Patients: SugarRoll.be Fact Sheet for Healthcare Providers: https://www.woods-mathews.com/ This test is not yet approved or cleared by the [place] FDA and  has been authorized for detection and/or diagnosis of SARS-CoV-2 by FDA under an Emergency Use Authorization (EUA). This EUA will remain  in effect (meaning this test can be used) for the duration of the COVID-19 declaration under Section 56 4(b)(1) of the Act, 21 U.S.C. section 360bbb-3(b)(1), unless the authorization is terminated or revoked sooner. Performed at [facility],  [place] [address] [place], [place] [address]   Culture, Urine     Status: None   Collection Time: 04/06/19 11:36 PM   Specimen: Urine, Random  Result Value Ref Range Status   Specimen Description URINE, RANDOM  Final   Special Requests NONE  Final   Culture   Final    NO GROWTH Performed at [facility], [place] [address] [place], [place] [address]    Report Status 04/08/2019 FINAL  Final     Labs: CBC: Recent Labs  Lab 04/06/19 1322 04/07/19 0432 04/08/19 0457  WBC 7.7 7.4 7.5  HGB 14.8 12.9* 10.7*  HCT 41.7 36.4* 31.3*  MCV 81.4 82.2 84.1  PLT 245 206 659   Basic Metabolic Panel: Recent Labs  Lab 04/06/19 1322 04/07/19 0432 04/08/19 0457  NA 127* 133* 130*  K 3.8 3.7 3.7  CL 88* 95* 98  CO2 25 25 24   GLUCOSE 310* 280* 335*  BUN 20 25* 30*  CREATININE 1.89* 2.12* 2.07*  CALCIUM 9.4 8.6* 8.3*   Liver Function Tests: Recent Labs  Lab 04/06/19 1322  AST 13*  ALT 13  ALKPHOS 91  BILITOT 1.4*  PROT 7.3  ALBUMIN 3.6   No results for input(s): LIPASE, AMYLASE in the last 168 hours. No results for input(s): AMMONIA in the last 168 hours. Cardiac Enzymes: No results for input(s): CKTOTAL, CKMB, CKMBINDEX, TROPONINI in the last 168 hours. BNP (last 3 results) No results for input(s): BNP in the last 8760 hours. CBG: Recent Labs  Lab 04/07/19 1623 04/07/19 1744 04/07/19 2116 04/08/19 0646 04/08/19 1152  GLUCAP 234* 146* 88 303* 228*    Time spent: 35 minutes  Signed:  Berle Mull  [facility] 04/08/2019 4:05 PM

## 2019-04-10 NOTE — Progress Notes (Signed)
[facility], [facility] ([id number]) Visit Report for 04/09/2019 Debridement Details Patient Name: [name], [name] Date of Service: 04/09/2019 3:00 PM Medical Record Number: [id number] Patient Account Number: [id number] Date of Birth/Sex: 05-Jul-1945 (73 y.o. M) Treating RN: Army Melia Primary Care Provider: [name] Other Clinician: Referring Provider: [name] Treating Provider/Extender: Beverly Gust in Treatment: 8 Debridement Performed for Wound #5 Left,Lateral Foot Assessment: Performed By: Physician Tobi [name], MD Debridement Type: Debridement Severity of Tissue Pre Fat layer exposed Debridement: Level of Consciousness (Pre- Awake and Alert procedure): Pre-procedure Verification/Time Yes - 15:08 Out Taken: Start Time: 15:09 Pain Control: Lidocaine Total Area Debrided (L x W): 0.7 (cm) x 0.9 (cm) = 0.63 (cm) Tissue and other material Non-Viable, Callus, Slough, Slough debrided: Level: Non-Viable Tissue Debridement Description: Selective/Open Wound Instrument: Curette Bleeding: None End Time: 15:10 Response to Treatment: Procedure was tolerated well Level of Consciousness Awake and Alert (Post-procedure): Post Debridement Measurements of Total Wound Length: (cm) 0.7 Width: (cm) 0.9 Depth: (cm) 0.1 Volume: (cm) 0.049 Character of Wound/Ulcer Post Debridement: Stable Severity of Tissue Post Debridement: Fat layer exposed Post Procedure Diagnosis Same as Pre-procedure Electronic Signature(s) Signed: 04/09/2019 4:26:33 PM By: Tobi [name] MD, MBA Signed: 04/10/2019 8:54:20 AM By: Army Melia Entered By: Army Melia on 04/09/2019 15:10:20 [name] ([id number]) -------------------------------------------------------------------------------- HPI Details Patient Name: [name] Date of Service: 04/09/2019 3:00 PM Medical Record Number: [id number] Patient Account Number: [id number] Date of Birth/Sex: 1945-06-26 (73  y.o. M) Treating RN: Harold Barban Primary Care Provider: [name] Other Clinician: Referring Provider: [name] Treating Provider/Extender: Beverly Gust in Treatment: 8 History of Present Illness HPI Description: 10/04/16 on evaluation today patient presents for initial evaluation concerning a laceration of the right wrist and hand on the bowler aspect. He tells me that he fell into glass following a medication change in regard to his diabetes. He initially went to [facility] where she was sutured and subsequently his daughter who is a Marine scientist removed the teachers at the appropriate time. Unfortunately the wound has done poorly and dehissed which has caused him some trouble including infection. He has been on antibiotics both topically and orally though the wound continues to show signs of necrotic tissue according to notes which is what he was sent to [place] for evaluation concerning. He does have diabetes and unfortunately this is severely uncontrolled. He does see an endocrinologist and is working on this. 10/11/2016 -- the patient's notes from the ER were reviewed and he had his suturing done on 09/17/2016 and had necrotic skin flaps which had to be trimmed the last visit he was here. He has managed to get his Santyl ointment and has been using this appropriately. 10/21/16 patient's wound appears to be doing very well on evaluation today. He still has some Slough covering the wound bed but this is nowhere near as severe as when i saw this initially two weeks ago nor even my review of his pictures last week. I'm pleased with how this is progressing. Patient's wound over the right form appears to be doing fairly well on evaluation today. There is no evidence of infection and he has a small area at the crease of where she is wrist extends and flexes that is still open and appears to be having difficulty due to the fact that it is cracking open. I think this is something  that may be controlled with moisture control that is keeping the wound a little bit more moist. Nonetheless this is  a tiny region compared to the initial wound and overall appears to be doing very well. No fevers, chills, nausea, or vomiting noted at this time. 11/25/16 on evaluation today patient appears to be doing well in regard to his right wrist ulcer. In fact this appears to be completely healed he is having no discomfort. He is pleased with how this is progressed. Readmission: 06/24/17 on evaluation today patient appears back in our clinic regarding issues that he has been having with his right ankle on the lateral malleolus as well is in the dorsal surface of his left foot. Both have been going on for several weeks unfortunately. He tells me that initially this was being managed by Dr. Edilia Bo and I did review the notes from Dr. Edilia Bo in epic. Patient was placed on amoxicillin initially and then subsequently Keflex following. He has been using triple antibiotic ointment over-the-counter along with a Band-Aid for the wound currently. He did not have any Santyl remaining. His most recent hemoglobin A1c which was just two days ago was 10.3. Fortunately he seems to have excellent blood flow in the bilateral lower extremities with his left ABI being 0.95 in the right ABI 1.01. Patient does have some pain in regard to each of these ulcerations but he tells me it's not nearly as severe as the hand wound that I help treat earlier over the summer. This was in 2018. 07/15/17 on evaluation today patient's lower surety ulcer is actually both appear to be doing fairly well at this point. He does have a little bit of skin overlapping the ulcer on the right lateral ankle this was debrided away today just with saline and gauze without complication patient had no discomfort or pain. Otherwise patient's left foot ulcer does appear to be showing signs of improvement he does seem to have a right great toe.  Nikki and noted at this point in time. I think that this does need to be managed at this point. No fevers, chills, nausea, or vomiting noted at this time. 07/22/17 on evaluation today patient's wounds in regard to his lower extremities appear to be doing somewhat better. His right ankle wound in fact appears to likely be completely closed. The left dorsal foot ulcer though still open does appear to be a little bit smaller and does also seem to be making good progress. He has not been having problems with the treatment at this point in the general he does not seem to show as much erythema surrounding the wound bed which is good news. He is not TEJON, GRACIE E. ([id number]) having significant discomfort. 08/12/17 on evaluation today patients wounds of both appeared to be doing better in fact they both appear to be completely healed. Overall I'm very pleased with how things have progressed over the past week even more than what I expected. Obviously this is good news and he is happy about this. He did have a fall when going to his orthopedic doctor recently fortunately he does not appear to have injured anything severely but nonetheless does have a couple of bumps and bruises nothing significant at this time but he needs to be seen and wound care for. He may have broken his left wrist he is following up with orthopedics in that regard. Readmission: 10/24/17 on evaluation today patient presents for reevaluation after having been discharged Aug 12, 2017. His wound was healed at that point although he states that it has been somewhat discolored since that time. He has not experienced the  drainage as far as the wound is concerned but the fact that it was still discolored been greater than two months after the fact made him want to come in for reevaluation. Upon inspection today the patient has no pain although he does have neuropathy. He does have a discolored region of the dorsal surface of the left foot  which was the site where the ulcer was that we previously treated. The good news is he does not again have any discomfort although obviously I do believe this may be some scar tissue and just discoloration in that regard. Again he hasn't had any drainage or discharge since I last saw him and at this point I see no evidence of that either this area shows complete epithelialization. In general I do not feel like there's any significant issue at this point in that regard. READMISSION 02/09/2019 This is a 73 year old man with type 2 diabetes poorly controlled with significant peripheral neuropathy. He has been in this clinic before in 2018 with a laceration on his right hand and then again in 2019 with an area over the right lateral malleolus. 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 He states that the wound is been there for the last week or 2. It is been uncomfortable to walk on. The man is an active man having to look after an elderly mother. Past medical history, type 2 diabetes with polyneuropathy with a recent hemoglobin A1c in September 2011 0.3, lumbar disc disease, hypergonadism., Left foot drop and gout. We did not repeat his ABIs in the clinic quoting values from July 2019 of 0.95 and 1.01. He is not felt to have significant PAD 02/19/2019 on evaluation today patient actually appears to be doing better with regard to his foot wound based on what I am seeing currently. He was seen by Dr. Dellia Nims on readmission back into the clinic last week when I was off. The good news is the patient has been tolerating the dressing changes without any complication in fact has not really had much in the way of drainage. He does have an AFO brace which she believes is actually pushing on this area as well and has contributed to the issue. Fortunately there is no signs of active infection at this point. His ABIs appear to be  well. 03/05/2019 on evaluation today patient appears to be doing well in regard to his foot ulcer. He has been tolerating the dressing changes without complication. Fortunately there is no signs of active infection. The wound is measuring somewhat smaller and overall I feel like he is making good progress at this point. 03/12/2019 on evaluation today patient actually appears to be doing quite well with regard to his foot ulcer. The wound is looking better the measurement is not significantly better at this point unfortunately but nonetheless again the overall appearance is improving I think he is making some progress. I still think the big issue here is foot drop and again the patient wants to see if potentially he could get a brace to help with this. Subsequently I think that we can definitely make a referral to [facility] to see if they can help [place] in this regard. He is in agreement with that plan. 03/26/2019 on evaluation today patient appears to be doing more poorly in regard to his foot ulcer at this time. He did see podiatry in order to see about  getting a brace but unfortunately he tells me that Medicare is not likely to pay for one for him as he had 1 I guess 8 months ago. However this one that apparently goes in his shoes that does not seem to be working out well for him. Unfortunately there are signs of active infection at this time. No fevers, chills, nausea, vomiting, or diarrhea. Systemically. The patient has erythema on the dorsal surface of his foot that tracks from the lateral portion of his foot and again he does have a blister around the wound as well which is worse compared to previous. Overall I am concerned. 04/02/2019 on evaluation today patient appears to be doing about the same with regard to his wound. Unfortunately there is no signs of significant improvement although I do think there is no signs of infection which is good news. His drainage is also SHELBY, ANDERLE E.  ([id number]) slowed down tremendously. I think at this point he would benefit from the initiation of a total contact cast. The only concern I have is that over his balance. I explained to the patient that if we were to initiate the cast I think it would be greatly beneficial for him but he would need to ensure not to walk at all unless he uses a walker he actually has 2 walkers one for inside his home and one that he keeps out in the car. For that reason he would be covered in that regard. I just do not want him to have any accidents and fall with the cast. 12/28-Patient comes in 1 week out a day early for his appointment he was in the hospital for 2 days for uncontrolled hyperglycemia and required management of high blood sugars, with some adjustments in his home regimen of insulin, patient stubbed his right great toe today and put a Band-Aid on it, he is here for his left lateral foot ulcer and we are using silver alginate dressing, he was intact total contact cast with difficulty with balance to the extent that that had to be discontinued Electronic Signature(s) Signed: 04/09/2019 3:15:40 PM By: Tobi [name] MD, MBA Entered By: Tobi [name] on 04/09/2019 15:15:40 [name] ([id number]) -------------------------------------------------------------------------------- Physical Exam Details Patient Name: [name] Date of Service: 04/09/2019 3:00 PM Medical Record Number: [id number] Patient Account Number: [id number] Date of Birth/Sex: December 11, 1945 (73 y.o. M) Treating RN: Harold Barban Primary Care Provider: [name] Other Clinician: Referring Provider: [name] Treating Provider/Extender: Beverly Gust in Treatment: 8 Constitutional alert and oriented x 3. sitting or standing blood pressure is within target range for patient.. supine blood pressure is within target range for patient.. pulse regular and within target range for patient.[name] Kitchen  respirations regular, non-labored and within target range for patient.[name] Kitchen temperature within target range for patient.. . . Well-nourished and well-hydrated in no acute distress. Notes The left lateral foot wound on the plantar aspect is covered by callus rim which I curetted with a #3 curette, the wound base appears fairly intact with not much devitalized tissue, other than the callus from the surrounding skin appears fairly dry The right great toe Band-Aid was taken off with dystrophic nail noted with very minimal hematoma around the edges from his stubbing of his toe today. Electronic Signature(s) Signed: 04/09/2019 3:16:56 PM By: Tobi [name] MD, MBA Entered By: Tobi [name] on 04/09/2019 15:16:56 [name] ([id number]) -------------------------------------------------------------------------------- Physician Orders Details Patient Name: [name] Date of Service: 04/09/2019 3:00 PM Medical Record Number: [id number] Patient  Account Number: [id number] Date of Birth/Sex: April 17, 1945 (73 y.o. M) Treating RN: Army Melia Primary Care Provider: [name] Other Clinician: Referring Provider: [name] Treating Provider/Extender: Beverly Gust in Treatment: 8 Verbal / Phone Orders: No Diagnosis Coding Wound Cleansing Wound #5 Left,Lateral Foot o May shower with protection. Primary Wound Dressing Wound #5 Left,Lateral Foot o Silver Collagen Secondary Dressing Wound #5 Left,Lateral Foot o Conform/Kerlix - Rolled gauze o Foam - To pad area Follow-up Appointments Wound #5 Left,Lateral Foot o Return Appointment in 1 week. Electronic Signature(s) Signed: 04/09/2019 4:26:33 PM By: Tobi [name] MD, MBA Signed: 04/10/2019 8:54:20 AM By: Army Melia Entered By: Army Melia on 04/09/2019 15:13:29 [name] ([id number]) -------------------------------------------------------------------------------- Progress Note  Details Patient Name: [name] Date of Service: 04/09/2019 3:00 PM Medical Record Number: [id number] Patient Account Number: [id number] Date of Birth/Sex: [birth year] (73 y.o. M) Treating RN: Harold Barban Primary Care Provider: [name] Other Clinician: Referring Provider: [name] Treating Provider/Extender: Beverly Gust in Treatment: 8 Subjective History of Present Illness (HPI) 10/04/16 on evaluation today patient presents for initial evaluation concerning a laceration of the right wrist and hand on the bowler aspect. He tells me that he fell into glass following a medication change in regard to his diabetes. He initially went to [facility] where she was sutured and subsequently his daughter who is a Marine scientist removed the teachers at the appropriate time. Unfortunately the wound has done poorly and dehissed which has caused him some trouble including infection. He has been on antibiotics both topically and orally though the wound continues to show signs of necrotic tissue according to notes which is what he was sent to [place] for evaluation concerning. He does have diabetes and unfortunately this is severely uncontrolled. He does see an endocrinologist and is working on this. 10/11/2016 -- the patient's notes from the ER were reviewed and he had his suturing done on 09/17/2016 and had necrotic skin flaps which had to be trimmed the last visit he was here. He has managed to get his Santyl ointment and has been using this appropriately. 10/21/16 patient's wound appears to be doing very well on evaluation today. He still has some Slough covering the wound bed but this is nowhere near as severe as when i saw this initially two weeks ago nor even my review of his pictures last week. I'm pleased with how this is progressing. Patient's wound over the right form appears to be doing fairly well on evaluation today. There is no evidence of infection and he has a small  area at the crease of where she is wrist extends and flexes that is still open and appears to be having difficulty due to the fact that it is cracking open. I think this is something that may be controlled with moisture control that is keeping the wound a little bit more moist. Nonetheless this is a tiny region compared to the initial wound and overall appears to be doing very well. No fevers, chills, nausea, or vomiting noted at this time. 11/25/16 on evaluation today patient appears to be doing well in regard to his right wrist ulcer. In fact this appears to be completely healed he is having no discomfort. He is pleased with how this is progressed. Readmission: 06/24/17 on evaluation today patient appears back in our clinic regarding issues that he has been having with his right ankle on the lateral malleolus as well is in the dorsal surface of his left foot. Both have  been going on for several weeks unfortunately. He tells me that initially this was being managed by Dr. Edilia Bo and I did review the notes from Dr. Edilia Bo in epic. Patient was placed on amoxicillin initially and then subsequently Keflex following. He has been using triple antibiotic ointment over-the-counter along with a Band-Aid for the wound currently. He did not have any Santyl remaining. His most recent hemoglobin A1c which was just two days ago was 10.3. Fortunately he seems to have excellent blood flow in the bilateral lower extremities with his left ABI being 0.95 in the right ABI 1.01. Patient does have some pain in regard to each of these ulcerations but he tells me it's not nearly as severe as the hand wound that I help treat earlier over the summer. This was in 2018. 07/15/17 on evaluation today patient's lower surety ulcer is actually both appear to be doing fairly well at this point. He does have a little bit of skin overlapping the ulcer on the right lateral ankle this was debrided away today just with saline and  gauze without complication patient had no discomfort or pain. Otherwise patient's left foot ulcer does appear to be showing signs of improvement he does seem to have a right great toe. Nikki and noted at this point in time. I think that this does need to be managed at this point. No fevers, chills, nausea, or vomiting noted at this time. 07/22/17 on evaluation today patient's wounds in regard to his lower extremities appear to be doing somewhat better. His right ankle wound in fact appears to likely be completely closed. The left dorsal foot ulcer though still open does appear to be a little bit smaller and does also seem to be making good progress. He has not been having problems with the treatment at this PJ, ZEHNER. ([id number]) point in the general he does not seem to show as much erythema surrounding the wound bed which is good news. He is not having significant discomfort. 08/12/17 on evaluation today patients wounds of both appeared to be doing better in fact they both appear to be completely healed. Overall I'm very pleased with how things have progressed over the past week even more than what I expected. Obviously this is good news and he is happy about this. He did have a fall when going to his orthopedic doctor recently fortunately he does not appear to have injured anything severely but nonetheless does have a couple of bumps and bruises nothing significant at this time but he needs to be seen and wound care for. He may have broken his left wrist he is following up with orthopedics in that regard. Readmission: 10/24/17 on evaluation today patient presents for reevaluation after having been discharged Aug 12, 2017. His wound was healed at that point although he states that it has been somewhat discolored since that time. He has not experienced the drainage as far as the wound is concerned but the fact that it was still discolored been greater than two months after the fact made him  want to come in for reevaluation. Upon inspection today the patient has no pain although he does have neuropathy. He does have a discolored region of the dorsal surface of the left foot which was the site where the ulcer was that we previously treated. The good news is he does not again have any discomfort although obviously I do believe this may be some scar tissue and just discoloration in that regard. Again he hasn't  had any drainage or discharge since I last saw him and at this point I see no evidence of that either this area shows complete epithelialization. In general I do not feel like there's any significant issue at this point in that regard. READMISSION 02/09/2019 This is a 73 year old man with type 2 diabetes poorly controlled with significant peripheral neuropathy. He has been in this clinic before in 2018 with a laceration on his right hand and then again in 2019 with an area over the right lateral malleolus. 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 He states that the wound is been there for the last week or 2. It is been uncomfortable to walk on. The man is an active man having to look after an elderly mother. Past medical history, type 2 diabetes with polyneuropathy with a recent hemoglobin A1c in September 2011 0.3, lumbar disc disease, hypergonadism., Left foot drop and gout. We did not repeat his ABIs in the clinic quoting values from July 2019 of 0.95 and 1.01. He is not felt to have significant PAD 02/19/2019 on evaluation today patient actually appears to be doing better with regard to his foot wound based on what I am seeing currently. He was seen by Dr. Dellia Nims on readmission back into the clinic last week when I was off. The good news is the patient has been tolerating the dressing changes without any complication in fact has not really had much in the way of drainage. He does have an AFO  brace which she believes is actually pushing on this area as well and has contributed to the issue. Fortunately there is no signs of active infection at this point. His ABIs appear to be well. 03/05/2019 on evaluation today patient appears to be doing well in regard to his foot ulcer. He has been tolerating the dressing changes without complication. Fortunately there is no signs of active infection. The wound is measuring somewhat smaller and overall I feel like he is making good progress at this point. 03/12/2019 on evaluation today patient actually appears to be doing quite well with regard to his foot ulcer. The wound is looking better the measurement is not significantly better at this point unfortunately but nonetheless again the overall appearance is improving I think he is making some progress. I still think the big issue here is foot drop and again the patient wants to see if potentially he could get a brace to help with this. Subsequently I think that we can definitely make a referral to [facility] to see if they can help [place] in this regard. He is in agreement with that plan. 03/26/2019 on evaluation today patient appears to be doing more poorly in regard to his foot ulcer at this time. He did see podiatry in order to see about getting a brace but unfortunately he tells me that Medicare is not likely to pay for one for him as he had 1 I guess 8 months ago. However this one that apparently goes in his shoes that does not seem to be working out well for him. Unfortunately there are signs of active infection at this time. No fevers, chills, nausea, vomiting, or diarrhea. Systemically. The patient has erythema on the dorsal surface of his foot that tracks from the lateral portion of his foot and again he does have a blister around the wound as well which is worse compared to  previous. Overall I am concerned. 04/02/2019 on evaluation today patient appears to be doing about the same with regard to  his wound. Unfortunately there is SURESH, AUDI ([id number]) no signs of significant improvement although I do think there is no signs of infection which is good news. His drainage is also slowed down tremendously. I think at this point he would benefit from the initiation of a total contact cast. The only concern I have is that over his balance. I explained to the patient that if we were to initiate the cast I think it would be greatly beneficial for him but he would need to ensure not to walk at all unless he uses a walker he actually has 2 walkers one for inside his home and one that he keeps out in the car. For that reason he would be covered in that regard. I just do not want him to have any accidents and fall with the cast. 12/28-Patient comes in 1 week out a day early for his appointment he was in the hospital for 2 days for uncontrolled hyperglycemia and required management of high blood sugars, with some adjustments in his home regimen of insulin, patient stubbed his right great toe today and put a Band-Aid on it, he is here for his left lateral foot ulcer and we are using silver alginate dressing, he was intact total contact cast with difficulty with balance to the extent that that had to be discontinued Objective Constitutional alert and oriented x 3. sitting or standing blood pressure is within target range for patient.. supine blood pressure is within target range for patient.. pulse regular and within target range for patient.[name] Kitchen respirations regular, non-labored and within target range for patient.[name] Kitchen temperature within target range for patient.. Well-nourished and well-hydrated in no acute distress. Vitals Time Taken: 3:00 PM, Height: 71 in, Weight: 205 lbs, BMI: 28.6, Temperature: 97.8 F, Pulse: 82 bpm, Respiratory Rate: 16 breaths/min, Blood Pressure: 122/67 mmHg. General Notes: The left lateral foot wound on the plantar aspect is covered by callus rim which I curetted with a #3  curette, the wound base appears fairly intact with not much devitalized tissue, other than the callus from the surrounding skin appears fairly dry The right great toe Band-Aid was taken off with dystrophic nail noted with very minimal hematoma around the edges from his stubbing of his toe today. Integumentary (Hair, Skin) Wound #5 status is Open. Original cause of wound was Pressure Injury. The wound is located on the Left,Lateral Foot. The wound measures 0.7cm length x 0.9cm width x 0.1cm depth; 0.495cm^2 area and 0.049cm^3 volume. There is Fat Layer (Subcutaneous Tissue) Exposed exposed. There is no tunneling or undermining noted. There is a medium amount of serosanguineous drainage noted. There is small (1-33%) pink granulation within the wound bed. There is a large (67-100%) amount of necrotic tissue within the wound bed including Adherent Slough. Procedures Wound #5 Pre-procedure diagnosis of Wound #5 is a Diabetic Wound/Ulcer of the Lower Extremity located on the Left,Lateral Foot .Severity of Tissue Pre Debridement is: Fat layer exposed. There was a Selective/Open Wound Non-Viable Tissue Debridement with a total area of 0.63 sq cm performed by Tobi [name], MD. With the following instrument(s): Curette to remove Non-Viable tissue/material. Material removed includes Callus and Slough and after achieving pain control using Lidocaine. A time out was conducted at 15:08, prior to the start of the procedure. There was no bleeding. The procedure was tolerated well. Post Debridement Measurements: 0.7cm length x 0.9cm width  x 0.1cm depth; 0.049cm^3 volume. Character of Wound/Ulcer Post Debridement is stable. Severity of Tissue Post Debridement is: Fat layer exposed. EZANA, HUBBERT ([id number]) Post procedure Diagnosis Wound #5: Same as Pre-Procedure Plan Wound Cleansing: Wound #5 Left,Lateral Foot: May shower with protection. Primary Wound Dressing: Wound #5 Left,Lateral Foot: Silver  Collagen Secondary Dressing: Wound #5 Left,Lateral Foot: Conform/Kerlix - Rolled gauze Foam - To pad area Follow-up Appointments: Wound #5 Left,Lateral Foot: Return Appointment in 1 week. 1. Continue using silver alginate dressing to the left lateral foot wound, 2. Return to clinic next week Electronic Signature(s) Signed: 04/09/2019 3:17:58 PM By: Tobi [name] MD, MBA Entered By: Tobi [name] on 04/09/2019 15:17:57 [name] ([id number]) -------------------------------------------------------------------------------- [facility] Details Patient Name: [name] Date of Service: 04/09/2019 Medical Record Number: [id number] Patient Account Number: [id number] Date of Birth/Sex: 04-02-1946 (73 y.o. 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

## 2019-04-10 NOTE — Progress Notes (Signed)
CRISANTO, [name] ([id number]) Visit Report for 04/09/2019 Arrival Information Details Patient Name: [name], [name] Date of Service: 04/09/2019 3:00 PM Medical Record Number: [id number] Patient Account Number: [id number] Date of Birth/Sex: February 25, 1946 (73 y.o. M) Treating RN: Army Melia Primary Care Elizar Alpern: [name] Other Clinician: Referring Wilbern Pennypacker: [name] Treating Zamya Culhane/Extender: Beverly Gust in Treatment: 8 Visit Information History Since Last Visit Added or deleted any medications: No Patient Arrived: Walker Any new allergies or adverse reactions: No Arrival Time: 14:59 Had a fall or experienced change in No Accompanied By: self activities of daily living that may affect Transfer Assistance: None risk of falls: Patient Identification Verified: Yes Signs or symptoms of abuse/neglect since last visito No Patient Has Alerts: Yes Hospitalized since last visit: No Patient Alerts: DMII Has Dressing in Place as Prescribed: Yes Pain Present Now: No Electronic Signature(s) Signed: 04/10/2019 8:54:20 AM By: Army Melia Entered By: Army Melia on 04/09/2019 14:59:59 [name] ([id number]) -------------------------------------------------------------------------------- Encounter Discharge Information Details Patient Name: [name] Date of Service: 04/09/2019 3:00 PM Medical Record Number: [id number] Patient Account Number: [id number] Date of Birth/Sex: 02/07/1946 (73 y.o. M) Treating RN: Army Melia Primary Care Indie Boehne: [name] Other Clinician: Referring Ramon Brant: [name] Treating Claudis Giovanelli/Extender: Beverly Gust in Treatment: 8 Encounter Discharge Information Items Post Procedure Vitals Discharge Condition: Stable Temperature (F): 97.8 Ambulatory Status: Walker Pulse (bpm): 82 Discharge Destination: Home Respiratory Rate (breaths/min): 16 Transportation: Private Auto Blood Pressure (mmHg):  122/67 Accompanied By: self Schedule Follow-up Appointment: Yes Clinical Summary of Care: Electronic Signature(s) Signed: 04/10/2019 8:54:20 AM By: Army Melia Entered By: Army Melia on 04/09/2019 15:14:28 [name] ([id number]) -------------------------------------------------------------------------------- Lower Extremity Assessment Details Patient Name: [name] Date of Service: 04/09/2019 3:00 PM Medical Record Number: [id number] Patient Account Number: [id number] Date of Birth/Sex: Nov 16, 1945 (73 y.o. M) Treating RN: Army Melia Primary Care Alieah Brinton: [name] Other Clinician: Referring Dareth Andrew: [name] Treating Javid Kemler/Extender: Beverly Gust in Treatment: 8 Edema Assessment Assessed: [Left: No] [Right: No] Edema: [Left: N] [Right: o] Vascular Assessment Pulses: Dorsalis Pedis Palpable: [Left:Yes] Electronic Signature(s) Signed: 04/10/2019 8:54:20 AM By: Army Melia Entered By: Army Melia on 04/09/2019 15:03:43 [name] ([id number]) -------------------------------------------------------------------------------- Multi Wound Chart Details Patient Name: [name] Date of Service: 04/09/2019 3:00 PM Medical Record Number: [id number] Patient Account Number: [id number] Date of Birth/Sex: 1946-03-14 (73 y.o. M) Treating RN: Army Melia Primary Care Mikaelyn Arthurs: [name] Other Clinician: Referring Samentha Perham: [name] Treating Charlet Harr/Extender: Beverly Gust in Treatment: 8 Vital Signs Height(in): 71 Pulse(bpm): 44 Weight(lbs): 205 Blood Pressure(mmHg): 122/67 Body Mass Index(BMI): 29 Temperature(F): 97.8 Respiratory Rate 16 (breaths/min): Photos: [N/A:N/A] Wound Location: Left Foot - Lateral N/A N/A Wounding Event: Pressure Injury N/A N/A Primary Etiology: Diabetic Wound/Ulcer of the N/A N/A Lower Extremity Comorbid History: Cataracts, Middle ear N/A N/A problems, Sleep  Apnea, Hypertension, Type II Diabetes, Gout, Osteoarthritis, Neuropathy Date Acquired: 01/19/2019 N/A N/A Weeks of Treatment: 8 N/A N/A Wound Status: Open N/A N/A Measurements L x W x D 0.7x0.9x0.1 N/A N/A (cm) Area (cm) : 0.495 N/A N/A Volume (cm) : 0.049 N/A N/A % Reduction in Area: -80.00% N/A N/A % Reduction in Volume: 40.20% N/A N/A Classification: Grade 2 N/A N/A Exudate Amount: Medium N/A N/A Exudate Type: Serosanguineous N/A N/A Exudate Color: red, brown N/A N/A Granulation Amount: Small (1-33%) N/A N/A Granulation Quality: Pink N/A N/A Necrotic Amount: Large (67-100%) N/A N/A Exposed Structures: Fat Layer (Subcutaneous N/A N/A Tissue) Exposed: Yes Fascia: No Tendon: No  KRESTON, AHRENDT ([id number]) Muscle: No Joint: No Bone: No Epithelialization: None N/A N/A Treatment Notes Electronic Signature(s) Signed: 04/10/2019 8:54:20 AM By: Army Melia Entered By: Army Melia on 04/09/2019 15:06:05 [name] ([id number]) -------------------------------------------------------------------------------- [place] Details Patient Name: [name] Date of Service: 04/09/2019 3:00 PM Medical Record Number: [id number] Patient Account Number: [id number] Date of Birth/Sex: [birth year] (73 y.o. M) Treating RN: Army Melia Primary Care Fatim Vanderschaaf: [name] Other Clinician: Referring Maziah Keeling: [name] Treating Kelsey Edman/Extender: Beverly Gust in Treatment: 8 Active Inactive Electronic Signature(s) Signed: 04/10/2019 8:54:20 AM By: Army Melia Entered By: Army Melia on 04/09/2019 15:05:57 [name] ([id number]) -------------------------------------------------------------------------------- Pain Assessment Details Patient Name: [name] Date of Service: 04/09/2019 3:00 PM Medical Record Number: [id number] Patient Account Number: [id number] Date of Birth/Sex: [birth year] (73 y.o. M) Treating RN: Army Melia Primary Care Ruchy Wildrick: [name] Other Clinician: Referring Brayson Livesey: [name] Treating Loie Jahr/Extender: Beverly Gust in Treatment: 8 Active Problems Location of Pain Severity and Description of Pain Patient Has Paino No Site Locations Pain Management and Medication Current Pain Management: Electronic Signature(s) Signed: 04/10/2019 8:54:20 AM By: Army Melia Entered By: Army Melia on 04/09/2019 15:00:14 [name] ([id number]) -------------------------------------------------------------------------------- Patient/Caregiver Education Details Patient Name: [name] Date of Service: 04/09/2019 3:00 PM Medical Record Number: [id number] Patient Account Number: [id number] Date of Birth/Gender: [birth year] (73 y.o. M) Treating RN: Army Melia Primary Care Physician: [name] Other Clinician: Referring Physician: [name] Treating Physician/Extender: Beverly Gust in Treatment: 8 Education Assessment Education Provided To: Patient Education Topics Provided Wound/Skin Impairment: Handouts: Caring for Your Ulcer Methods: Demonstration, Explain/Verbal Responses: State content correctly Electronic Signature(s) Signed: 04/10/2019 8:54:20 AM By: Army Melia Entered By: Army Melia on 04/09/2019 15:13:44 [name] ([id number]) -------------------------------------------------------------------------------- Wound Assessment Details Patient Name: [name] Date of Service: 04/09/2019 3:00 PM Medical Record Number: [id number] Patient Account Number: [id number] Date of Birth/Sex: 05-31-1945 (73 y.o. M) Treating RN: Army Melia Primary Care Ana Woodroof: [name] Other Clinician: Referring Shalimar Mcclain: [name] Treating Briannah Lona/Extender: Beverly Gust in Treatment: 8 Wound Status Wound Number: 5 Primary Diabetic Wound/Ulcer of the Lower Extremity Etiology: Wound Location: Left  Foot - Lateral Wound Open Wounding Event: Pressure Injury Status: Date Acquired: 01/19/2019 Comorbid Cataracts, Middle ear problems, Sleep Apnea, Weeks Of Treatment: 8 History: Hypertension, Type II Diabetes, Gout, Clustered Wound: No Osteoarthritis, Neuropathy Photos Wound Measurements Length: (cm) 0.7 % Reduction i Width: (cm) 0.9 % Reduction i Depth: (cm) 0.1 Epithelializa Area: (cm) 0.495 Tunneling: Volume: (cm) 0.049 Undermining: n Area: -80% n Volume: 40.2% tion: None No No Wound Description Classification: Grade 2 Foul Odor Af Exudate Amount: Medium Slough/Fibri Exudate Type: Serosanguineous Exudate Color: red, brown ter Cleansing: No no Yes Wound Bed Granulation Amount: Small (1-33%) Exposed Structure Granulation Quality: Pink Fascia Exposed: No Necrotic Amount: Large (67-100%) Fat Layer (Subcutaneous Tissue) Exposed: Yes Necrotic Quality: Adherent Slough Tendon Exposed: No Muscle Exposed: No Joint Exposed: No Bone Exposed: No Treatment Notes Wound #5 (Left, Lateral Foot) CHAPMAN, MATTEUCCI E. ([id number]) Notes Silver collagen, Foam 2Layer , conform Electronic Signature(s) Signed: 04/10/2019 8:54:20 AM By: Army Melia Entered By: Army Melia on 04/09/2019 15:03:28 [name] ([id number]) -------------------------------------------------------------------------------- Vitals Details Patient Name: [name] Date of Service: 04/09/2019 3:00 PM Medical Record Number: [id number] Patient Account Number: [id number] Date of Birth/Sex: [birth year] (73 y.o. M) Treating RN: Army Melia Primary Care Milia Warth: [name] Other Clinician: Referring Tama Grosz: [name] Treating Taichi Repka/Extender: Beverly Gust in Treatment:  8 Vital Signs Time Taken: 15:00 Temperature (F): 97.8 Height (in): 71 Pulse (bpm): 82 Weight (lbs): 205 Respiratory Rate (breaths/min): 16 Body Mass Index (BMI): 28.6 Blood Pressure (mmHg):  122/67 Reference Range: 80 - 120 mg / dl Electronic Signature(s) Signed: 04/10/2019 8:54:20 AM By: Army Melia Entered By: Army Melia on 04/09/2019 15:00:29

## 2019-04-11 ENCOUNTER — Ambulatory Visit: Payer: Medicare Other | Admitting: Internal Medicine

## 2019-04-16 ENCOUNTER — Other Ambulatory Visit: Payer: Self-pay

## 2019-04-16 ENCOUNTER — Encounter: Payer: Medicare Other | Attending: Physician Assistant | Admitting: Physician Assistant

## 2019-04-16 DIAGNOSIS — M21372 Foot drop, left foot: Secondary | ICD-10-CM | POA: Diagnosis not present

## 2019-04-16 DIAGNOSIS — E11621 Type 2 diabetes mellitus with foot ulcer: Secondary | ICD-10-CM | POA: Insufficient documentation

## 2019-04-16 DIAGNOSIS — E1151 Type 2 diabetes mellitus with diabetic peripheral angiopathy without gangrene: Secondary | ICD-10-CM | POA: Insufficient documentation

## 2019-04-16 DIAGNOSIS — L97522 Non-pressure chronic ulcer of other part of left foot with fat layer exposed: Secondary | ICD-10-CM | POA: Diagnosis not present

## 2019-04-16 DIAGNOSIS — Z823 Family history of stroke: Secondary | ICD-10-CM | POA: Insufficient documentation

## 2019-04-16 DIAGNOSIS — Z87891 Personal history of nicotine dependence: Secondary | ICD-10-CM | POA: Diagnosis not present

## 2019-04-16 DIAGNOSIS — E1142 Type 2 diabetes mellitus with diabetic polyneuropathy: Secondary | ICD-10-CM | POA: Diagnosis not present

## 2019-04-16 DIAGNOSIS — Z809 Family history of malignant neoplasm, unspecified: Secondary | ICD-10-CM | POA: Insufficient documentation

## 2019-04-16 DIAGNOSIS — I252 Old myocardial infarction: Secondary | ICD-10-CM | POA: Insufficient documentation

## 2019-04-16 DIAGNOSIS — M109 Gout, unspecified: Secondary | ICD-10-CM | POA: Insufficient documentation

## 2019-04-16 DIAGNOSIS — I10 Essential (primary) hypertension: Secondary | ICD-10-CM | POA: Insufficient documentation

## 2019-04-16 DIAGNOSIS — Z833 Family history of diabetes mellitus: Secondary | ICD-10-CM | POA: Diagnosis not present

## 2019-04-16 DIAGNOSIS — M1990 Unspecified osteoarthritis, unspecified site: Secondary | ICD-10-CM | POA: Diagnosis not present

## 2019-04-16 DIAGNOSIS — Z8249 Family history of ischemic heart disease and other diseases of the circulatory system: Secondary | ICD-10-CM | POA: Diagnosis not present

## 2019-04-16 DIAGNOSIS — Z794 Long term (current) use of insulin: Secondary | ICD-10-CM | POA: Diagnosis not present

## 2019-04-16 NOTE — Progress Notes (Addendum)
CHUKWUEBUKA, CHURCHILL ([id number]) Visit Report for 04/16/2019 Chief Complaint Document Details Patient Name: [name], [name] Date of Service: 04/16/2019 8:15 AM Medical Record Number: [id number] Patient Account Number: [id number] Date of Birth/Sex: 1945/10/05 (74 y.o. M) Treating RN: Harold Barban Primary Care Provider: [name] Other Clinician: Referring Provider: [name] Treating Provider/Extender: Melburn Hake, Takashi Korol Weeks in Treatment: 9 Information Obtained from: Patient Chief Complaint Left dorsal foot ulcer 02/09/2019; patient is here for review of the wound on the left lateral foot roughly at the level of the base of the fifth metatarsal Electronic Signature(s) Signed: 04/16/2019 8:53:59 AM By: Worthy Keeler [name] Entered By: Worthy Keeler on 04/16/2019 08:53:59 [name] ([id number]) -------------------------------------------------------------------------------- Debridement Details Patient Name: [name] Date of Service: 04/16/2019 8:15 AM Medical Record Number: [id number] Patient Account Number: [id number] Date of Birth/Sex: May 23, 1945 (73 y.o. M) Treating RN: Harold Barban Primary Care Provider: [name] Other Clinician: Referring Provider: [name] Treating Provider/Extender: Melburn Hake, Fionnuala Hemmerich Weeks in Treatment: 9 Debridement Performed for Wound #5 Left,Lateral Foot Assessment: Performed By: Physician [name], Caleb Prigmore E., [name] Debridement Type: Debridement Severity of Tissue Pre Fat layer exposed Debridement: Level of Consciousness (Pre- Awake and Alert procedure): Pre-procedure Verification/Time Yes - 08:55 Out Taken: Start Time: 08:55 Pain Control: Lidocaine Total Area Debrided (L x W): 0.4 (cm) x 0.6 (cm) = 0.24 (cm) Tissue and other material Non-Viable, Callus, Slough, Subcutaneous, Slough debrided: Level: Skin/Subcutaneous Tissue Debridement Description: Excisional Instrument: Curette Bleeding:  Minimum Hemostasis Achieved: Pressure End Time: 08:59 Procedural Pain: 0 Post Procedural Pain: 0 Response to Treatment: Procedure was tolerated well Level of Consciousness Awake and Alert (Post-procedure): Post Debridement Measurements of Total Wound Length: (cm) 0.4 Width: (cm) 0.6 Depth: (cm) 0.1 Volume: (cm) 0.019 Character of Wound/Ulcer Post Debridement: Improved Severity of Tissue Post Debridement: Fat layer exposed Post Procedure Diagnosis Same as Pre-procedure Electronic Signature(s) Signed: 04/16/2019 4:45:12 PM By: Harold Barban Signed: 04/18/2019 6:44:22 PM By: Worthy Keeler [name] Entered By: Harold Barban on 04/16/2019 08:59:12 [name] ([id number]) -------------------------------------------------------------------------------- HPI Details Patient Name: [name] Date of Service: 04/16/2019 8:15 AM Medical Record Number: [id number] Patient Account Number: [id number] Date of Birth/Sex: 1945/05/08 (73 y.o. M) Treating RN: Harold Barban Primary Care Provider: [name] Other Clinician: Referring Provider: [name] Treating Provider/Extender: Melburn Hake, Jensen Cheramie Weeks in Treatment: 9 History of Present Illness HPI Description: 10/04/16 on evaluation today patient presents for initial evaluation concerning a laceration of the right wrist and hand on the bowler aspect. He tells me that he fell into glass following a medication change in regard to his diabetes. He initially went to [facility] where she was sutured and subsequently his daughter who is a Marine scientist removed the teachers at the appropriate time. Unfortunately the wound has done poorly and dehissed which has caused him some trouble including infection. He has been on antibiotics both topically and orally though the wound continues to show signs of necrotic tissue according to notes which is what he was sent to [place] for evaluation concerning. He does have diabetes and unfortunately this  is severely uncontrolled. He does see an endocrinologist and is working on this. 10/11/2016 -- the patient's notes from the ER were reviewed and he had his suturing done on 09/17/2016 and had necrotic skin flaps which had to be trimmed the last visit he was here. He has managed to get his Santyl ointment and has been using this appropriately. 10/21/16 patient's wound appears to be doing very  well on evaluation today. He still has some Slough covering the wound bed but this is nowhere near as severe as when i saw this initially two weeks ago nor even my review of his pictures last week. I'm pleased with how this is progressing. Patient's wound over the right form appears to be doing fairly well on evaluation today. There is no evidence of infection and he has a small area at the crease of where she is wrist extends and flexes that is still open and appears to be having difficulty due to the fact that it is cracking open. I think this is something that may be controlled with moisture control that is keeping the wound a little bit more moist. Nonetheless this is a tiny region compared to the initial wound and overall appears to be doing very well. No fevers, chills, nausea, or vomiting noted at this time. 11/25/16 on evaluation today patient appears to be doing well in regard to his right wrist ulcer. In fact this appears to be completely healed he is having no discomfort. He is pleased with how this is progressed. Readmission: 06/24/17 on evaluation today patient appears back in our clinic regarding issues that he has been having with his right ankle on the lateral malleolus as well is in the dorsal surface of his left foot. Both have been going on for several weeks unfortunately. He tells me that initially this was being managed by Dr. Edilia Bo and I did review the notes from Dr. Edilia Bo in epic. Patient was placed on amoxicillin initially and then subsequently Keflex following. He has been using  triple antibiotic ointment over-the-counter along with a Band-Aid for the wound currently. He did not have any Santyl remaining. His most recent hemoglobin A1c which was just two days ago was 10.3. Fortunately he seems to have excellent blood flow in the bilateral lower extremities with his left ABI being 0.95 in the right ABI 1.01. Patient does have some pain in regard to each of these ulcerations but he tells me it's not nearly as severe as the hand wound that I help treat earlier over the summer. This was in 2018. 07/15/17 on evaluation today patient's lower surety ulcer is actually both appear to be doing fairly well at this point. He does have a little bit of skin overlapping the ulcer on the right lateral ankle this was debrided away today just with saline and gauze without complication patient had no discomfort or pain. Otherwise patient's left foot ulcer does appear to be showing signs of improvement he does seem to have a right great toe. Nikki and noted at this point in time. I think that this does need to be managed at this point. No fevers, chills, nausea, or vomiting noted at this time. 07/22/17 on evaluation today patient's wounds in regard to his lower extremities appear to be doing somewhat better. His right ankle wound in fact appears to likely be completely closed. The left dorsal foot ulcer though still open does appear to be a little bit smaller and does also seem to be making good progress. He has not been having problems with the treatment at this point in the general he does not seem to show as much erythema surrounding the wound bed which is good news. He is not EAMES, DIBIASIO E. ([id number]) having significant discomfort. 08/12/17 on evaluation today patients wounds of both appeared to be doing better in fact they both appear to be completely healed. Overall I'm very pleased with how  things have progressed over the past week even more than what I expected. Obviously this is good  news and he is happy about this. He did have a fall when going to his orthopedic doctor recently fortunately he does not appear to have injured anything severely but nonetheless does have a couple of bumps and bruises nothing significant at this time but he needs to be seen and wound care for. He may have broken his left wrist he is following up with orthopedics in that regard. Readmission: 10/24/17 on evaluation today patient presents for reevaluation after having been discharged Aug 12, 2017. His wound was healed at that point although he states that it has been somewhat discolored since that time. He has not experienced the drainage as far as the wound is concerned but the fact that it was still discolored been greater than two months after the fact made him want to come in for reevaluation. Upon inspection today the patient has no pain although he does have neuropathy. He does have a discolored region of the dorsal surface of the left foot which was the site where the ulcer was that we previously treated. The good news is he does not again have any discomfort although obviously I do believe this may be some scar tissue and just discoloration in that regard. Again he hasn't had any drainage or discharge since I last saw him and at this point I see no evidence of that either this area shows complete epithelialization. In general I do not feel like there's any significant issue at this point in that regard. 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 He has been in this clinic before in 2018 with a laceration on his right hand and then again in 2019 with an area over the right lateral malleolus. 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 He states that the wound is been there for the last week or 2. It is been uncomfortable to walk on.  The man is an active man having to look after an elderly mother. Past medical history, type 2 diabetes with polyneuropathy with a recent hemoglobin A1c in September 2011 0.3, lumbar disc disease, hypergonadism., Left foot drop and gout. We did not repeat his ABIs in the clinic quoting values from July 2019 of 0.95 and 1.01. He is not felt to have significant PAD 02/19/2019 on evaluation today patient actually appears to be doing better with regard to his foot wound based on what I am seeing currently. He was seen by Dr. Dellia Nims on readmission back into the clinic last week when I was off. The good news is the patient has been tolerating the dressing changes without any complication in fact has not really had much in the way of drainage. He does have an AFO brace which she believes is actually pushing on this area as well and has contributed to the issue. Fortunately there is no signs of active infection at this point. His ABIs appear to be well. 03/05/2019 on evaluation today patient appears to be doing well in regard to his foot ulcer. He has been tolerating the dressing changes without complication. Fortunately there is no signs of active infection. The wound is measuring somewhat smaller and overall I feel like he is making good progress at this point. 03/12/2019 on evaluation today patient actually appears to  be doing quite well with regard to his foot ulcer. The wound is looking better the measurement is not significantly better at this point unfortunately but nonetheless again the overall appearance is improving I think he is making some progress. I still think the big issue here is foot drop and again the patient wants to see if potentially he could get a brace to help with this. Subsequently I think that we can definitely make a referral to [facility] to see if they can help [place] in this regard. He is in agreement with that plan. 03/26/2019 on evaluation today patient appears to be doing more  poorly in regard to his foot ulcer at this time. He did see podiatry in order to see about getting a brace but unfortunately he tells me that Medicare is not likely to pay for one for him as he had 1 I guess 8 months ago. However this one that apparently goes in his shoes that does not seem to be working out well for him. Unfortunately there are signs of active infection at this time. No fevers, chills, nausea, vomiting, or diarrhea. Systemically. The patient has erythema on the dorsal surface of his foot that tracks from the lateral portion of his foot and again he does have a blister around the wound as well which is worse compared to previous. Overall I am concerned. 04/02/2019 on evaluation today patient appears to be doing about the same with regard to his wound. Unfortunately there is no signs of significant improvement although I do think there is no signs of infection which is good news. His drainage is also [name], Dustin E. ([id number]) slowed down tremendously. I think at this point he would benefit from the initiation of a total contact cast. The only concern I have is that over his balance. I explained to the patient that if we were to initiate the cast I think it would be greatly beneficial for him but he would need to ensure not to walk at all unless he uses a walker he actually has 2 walkers one for inside his home and one that he keeps out in the car. For that reason he would be covered in that regard. I just do not want him to have any accidents and fall with the cast. 12/28-Patient comes in 1 week out a day early for his appointment he was in the hospital for 2 days for uncontrolled hyperglycemia and required management of high blood sugars, with some adjustments in his home regimen of insulin, patient stubbed his right great toe today and put a Band-Aid on it, he is here for his left lateral foot ulcer and we are using silver alginate dressing, he was intact total contact cast  with difficulty with balance to the extent that that had to be discontinued 04/16/2019 on evaluation today patient appears to be doing better with regard to the overall appearance of his wound size. This is good news. We had attempted a total contact cast but unfortunately he had some falling issues that the family contacted [place] about we took the cast off we did not put it back on. Nonetheless it ended up that just a couple days later he was in the hospital due to elevations in his blood sugar which went to around 500 and even a little higher. Subsequently he was admitted to the hospital and was in the hospital for a total of 3 days and that included Christmas day. Fortunately there got his blood sugar under  control he tells me that this is running about 200 now which is much better news. There does not appear to be any signs of active infection at this time. No fevers, chills, nausea, vomiting, or diarrhea. Electronic Signature(s) Signed: 04/16/2019 9:03:50 AM By: Worthy Keeler [name] Entered By: Worthy Keeler on 04/16/2019 09:03:49 [name] ([id number]) -------------------------------------------------------------------------------- Physical Exam Details Patient Name: [name] Date of Service: 04/16/2019 8:15 AM Medical Record Number: [id number] Patient Account Number: [id number] Date of Birth/Sex: 1945-09-22 (73 y.o. M) Treating RN: Harold Barban Primary Care Provider: [name] Other Clinician: Referring Provider: [name] Treating Provider/Extender: [name], Korrina Zern Weeks in Treatment: 9 Constitutional Well-nourished and well-hydrated in no acute distress. Respiratory normal breathing without difficulty. Psychiatric this patient is able to make decisions and demonstrates good insight into disease process. Alert and Oriented x 3. pleasant and cooperative. Notes His wound bed currently did require sharp debridement to clear away some of necrotic tissue from  the surface of the wound which the patient tolerated today without complication and post debridement the wound bed appears to be doing significantly better which is good news. He is measuring smaller and I am hopeful this will continue to show signs of improvement going forward. Electronic Signature(s) Signed: 04/16/2019 9:04:22 AM By: Worthy Keeler [name] Entered By: Worthy Keeler on 04/16/2019 09:04:22 [name] ([id number]) -------------------------------------------------------------------------------- Physician Orders Details Patient Name: [name] Date of Service: 04/16/2019 8:15 AM Medical Record Number: [id number] Patient Account Number: [id number] Date of Birth/Sex: 1945/05/27 (73 y.o. M) Treating RN: Harold Barban Primary Care Provider: [name] Other Clinician: Referring Provider: [name] Treating Provider/Extender: Melburn Hake, Sharlyne Koeneman Weeks in Treatment: 9 Verbal / Phone Orders: No Diagnosis Coding ICD-10 Coding Code Description E11.621 Type 2 diabetes mellitus with foot ulcer E11.42 Type 2 diabetes mellitus with diabetic polyneuropathy L97.522 Non-pressure chronic ulcer of other part of left foot with fat layer exposed M21.372 Foot drop, left foot Wound Cleansing Wound #5 Left,Lateral Foot o May shower with protection. Primary Wound Dressing Wound #5 Left,Lateral Foot o Silver Collagen Secondary Dressing Wound #5 Left,Lateral Foot o Conform/Kerlix - Rolled gauze o Foam - To pad area Follow-up Appointments Wound #5 Left,Lateral Foot o Return Appointment in 1 week. Off-Loading o Turn and reposition every 2 hours - Elevate foot when sitting Electronic Signature(s) Signed: 04/16/2019 4:45:12 PM By: Harold Barban Signed: 04/18/2019 6:44:22 PM By: Worthy Keeler [name] Entered By: Harold Barban on 04/16/2019 09:01:03 [name]  ([id number]) -------------------------------------------------------------------------------- Problem List Details Patient Name: [name] Date of Service: 04/16/2019 8:15 AM Medical Record Number: [id number] Patient Account Number: [id number] Date of Birth/Sex: [birth year] (73 y.o. M) Treating RN: Harold Barban Primary Care Provider: [name] Other Clinician: Referring Provider: [name] Treating Provider/Extender: Melburn Hake, Detrick Dani Weeks in Treatment: 9 Active Problems ICD-10 Evaluated Encounter Code Description Active Date Today Diagnosis E11.621 Type 2 diabetes mellitus with foot ulcer 02/09/2019 No Yes E11.42 Type 2 diabetes mellitus with diabetic polyneuropathy 02/09/2019 No Yes L97.522 Non-pressure chronic ulcer of other part of left foot with fat 02/09/2019 No Yes layer exposed M21.372 Foot drop, left foot 02/09/2019 No Yes Inactive Problems Resolved Problems Electronic Signature(s) Signed: 04/16/2019 8:53:53 AM By: Worthy Keeler [name] Entered By: Worthy Keeler on 04/16/2019 08:53:52 [name] ([id number]) -------------------------------------------------------------------------------- Progress Note Details Patient Name: [name] Date of Service: 04/16/2019 8:15 AM Medical Record Number: [id number] Patient Account Number: [id number] Date of Birth/Sex: 1946-02-23 (73 y.o. M) Treating  RN: Harold Barban Primary Care Provider: [name] Other Clinician: Referring Provider: [name] Treating Provider/Extender: Melburn Hake, Dhanya Bogle Weeks in Treatment: 9 Subjective Chief Complaint Information obtained from Patient Left dorsal foot ulcer 02/09/2019; patient is here for review of the wound on the left lateral foot roughly at the level of the base of the fifth metatarsal History of Present Illness (HPI) 10/04/16 on evaluation today patient presents for initial evaluation concerning a laceration of the right wrist and hand on  the bowler aspect. He tells me that he fell into glass following a medication change in regard to his diabetes. He initially went to [facility] where she was sutured and subsequently his daughter who is a Marine scientist removed the teachers at the appropriate time. Unfortunately the wound has done poorly and dehissed which has caused him some trouble including infection. He has been on antibiotics both topically and orally though the wound continues to show signs of necrotic tissue according to notes which is what he was sent to [place] for evaluation concerning. He does have diabetes and unfortunately this is severely uncontrolled. He does see an endocrinologist and is working on this. 10/11/2016 -- the patient's notes from the ER were reviewed and he had his suturing done on 09/17/2016 and had necrotic skin flaps which had to be trimmed the last visit he was here. He has managed to get his Santyl ointment and has been using this appropriately. 10/21/16 patient's wound appears to be doing very well on evaluation today. He still has some Slough covering the wound bed but this is nowhere near as severe as when i saw this initially two weeks ago nor even my review of his pictures last week. I'm pleased with how this is progressing. Patient's wound over the right form appears to be doing fairly well on evaluation today. There is no evidence of infection and he has a small area at the crease of where she is wrist extends and flexes that is still open and appears to be having difficulty due to the fact that it is cracking open. I think this is something that may be controlled with moisture control that is keeping the wound a little bit more moist. Nonetheless this is a tiny region compared to the initial wound and overall appears to be doing very well. No fevers, chills, nausea, or vomiting noted at this time. 11/25/16 on evaluation today patient appears to be doing well in regard to his right wrist ulcer. In fact  this appears to be completely healed he is having no discomfort. He is pleased with how this is progressed. Readmission: 06/24/17 on evaluation today patient appears back in our clinic regarding issues that he has been having with his right ankle on the lateral malleolus as well is in the dorsal surface of his left foot. Both have been going on for several weeks unfortunately. He tells me that initially this was being managed by Dr. Edilia Bo and I did review the notes from Dr. Edilia Bo in epic. Patient was placed on amoxicillin initially and then subsequently Keflex following. He has been using triple antibiotic ointment over-the-counter along with a Band-Aid for the wound currently. He did not have any Santyl remaining. His most recent hemoglobin A1c which was just two days ago was 10.3. Fortunately he seems to have excellent blood flow in the bilateral lower extremities with his left ABI being 0.95 in the right ABI 1.01. Patient does have some pain in regard to each of these ulcerations but  he tells me it's not nearly as severe as the hand wound that I help treat earlier over the summer. This was in 2018. 07/15/17 on evaluation today patient's lower surety ulcer is actually both appear to be doing fairly well at this point. He does have a little bit of skin overlapping the ulcer on the right lateral ankle this was debrided away today just with saline and gauze without complication patient had no discomfort or pain. Otherwise patient's left foot ulcer does appear to be showing signs of [name], TRAYWICK. ([id number]) improvement he does seem to have a right great toe. Nikki and noted at this point in time. I think that this does need to be managed at this point. No fevers, chills, nausea, or vomiting noted at this time. 07/22/17 on evaluation today patient's wounds in regard to his lower extremities appear to be doing somewhat better. His right ankle wound in fact appears to likely be completely  closed. The left dorsal foot ulcer though still open does appear to be a little bit smaller and does also seem to be making good progress. He has not been having problems with the treatment at this point in the general he does not seem to show as much erythema surrounding the wound bed which is good news. He is not having significant discomfort. 08/12/17 on evaluation today patients wounds of both appeared to be doing better in fact they both appear to be completely healed. Overall I'm very pleased with how things have progressed over the past week even more than what I expected. Obviously this is good news and he is happy about this. He did have a fall when going to his orthopedic doctor recently fortunately he does not appear to have injured anything severely but nonetheless does have a couple of bumps and bruises nothing significant at this time but he needs to be seen and wound care for. He may have broken his left wrist he is following up with orthopedics in that regard. Readmission: 10/24/17 on evaluation today patient presents for reevaluation after having been discharged Aug 12, 2017. His wound was healed at that point although he states that it has been somewhat discolored since that time. He has not experienced the drainage as far as the wound is concerned but the fact that it was still discolored been greater than two months after the fact made him want to come in for reevaluation. Upon inspection today the patient has no pain although he does have neuropathy. He does have a discolored region of the dorsal surface of the left foot which was the site where the ulcer was that we previously treated. The good news is he does not again have any discomfort although obviously I do believe this may be some scar tissue and just discoloration in that regard. Again he hasn't had any drainage or discharge since I last saw him and at this point I see no evidence of that either this area shows complete  epithelialization. In general I do not feel like there's any significant issue at this point in that regard. 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 He has been in this clinic before in 2018 with a laceration on his right hand and then again in 2019 with an area over the right lateral malleolus. 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  He states that the wound is been there for the last week or 2. It is been uncomfortable to walk on. The man is an active man having to look after an elderly mother. Past medical history, type 2 diabetes with polyneuropathy with a recent hemoglobin A1c in September 2011 0.3, lumbar disc disease, hypergonadism., Left foot drop and gout. We did not repeat his ABIs in the clinic quoting values from July 2019 of 0.95 and 1.01. He is not felt to have significant PAD 02/19/2019 on evaluation today patient actually appears to be doing better with regard to his foot wound based on what I am seeing currently. He was seen by Dr. Dellia Nims on readmission back into the clinic last week when I was off. The good news is the patient has been tolerating the dressing changes without any complication in fact has not really had much in the way of drainage. He does have an AFO brace which she believes is actually pushing on this area as well and has contributed to the issue. Fortunately there is no signs of active infection at this point. His ABIs appear to be well. 03/05/2019 on evaluation today patient appears to be doing well in regard to his foot ulcer. He has been tolerating the dressing changes without complication. Fortunately there is no signs of active infection. The wound is measuring somewhat smaller and overall I feel like he is making good progress at this point. 03/12/2019 on evaluation today patient  actually appears to be doing quite well with regard to his foot ulcer. The wound is looking better the measurement is not significantly better at this point unfortunately but nonetheless again the overall appearance is improving I think he is making some progress. I still think the big issue here is foot drop and again the patient wants to see if potentially he could get a brace to help with this. Subsequently I think that we can definitely make a referral to [facility] to see if they can help [place] in this regard. He is in agreement with that plan. 03/26/2019 on evaluation today patient appears to be doing more poorly in regard to his foot ulcer at this time. He did see podiatry in order to see about getting a brace but unfortunately he tells me that Medicare is not likely to pay for one for him as [name], [place]. ([id number]) he had 1 I guess 8 months ago. However this one that apparently goes in his shoes that does not seem to be working out well for him. Unfortunately there are signs of active infection at this time. No fevers, chills, nausea, vomiting, or diarrhea. Systemically. The patient has erythema on the dorsal surface of his foot that tracks from the lateral portion of his foot and again he does have a blister around the wound as well which is worse compared to previous. Overall I am concerned. 04/02/2019 on evaluation today patient appears to be doing about the same with regard to his wound. Unfortunately there is no signs of significant improvement although I do think there is no signs of infection which is good news. His drainage is also slowed down tremendously. I think at this point he would benefit from the initiation of a total contact cast. The only concern I have is that over his balance. I explained to the patient that if we were to initiate the cast I think it would be greatly beneficial for him but he would need to ensure not to walk at all  unless he uses a walker he actually  has 2 walkers one for inside his home and one that he keeps out in the car. For that reason he would be covered in that regard. I just do not want him to have any accidents and fall with the cast. 12/28-Patient comes in 1 week out a day early for his appointment he was in the hospital for 2 days for uncontrolled hyperglycemia and required management of high blood sugars, with some adjustments in his home regimen of insulin, patient stubbed his right great toe today and put a Band-Aid on it, he is here for his left lateral foot ulcer and we are using silver alginate dressing, he was intact total contact cast with difficulty with balance to the extent that that had to be discontinued 04/16/2019 on evaluation today patient appears to be doing better with regard to the overall appearance of his wound size. This is good news. We had attempted a total contact cast but unfortunately he had some falling issues that the family contacted [place] about we took the cast off we did not put it back on. Nonetheless it ended up that just a couple days later he was in the hospital due to elevations in his blood sugar which went to around 500 and even a little higher. Subsequently he was admitted to the hospital and was in the hospital for a total of 3 days and that included Christmas day. Fortunately there got his blood sugar under control he tells me that this is running about 200 now which is much better news. There does not appear to be any signs of active infection at this time. No fevers, chills, nausea, vomiting, or diarrhea. Patient History Information obtained from Patient. Family History Cancer - Siblings, Diabetes - Father, Hypertension - Father, Kidney Disease - Mother, Stroke - Father, No family history of Heart Disease, Lung Disease, Thyroid Problems, Tuberculosis. Social History Former smoker, Marital Status - Widowed, Alcohol Use - Never, Drug Use - No History, Caffeine Use - Moderate. Medical  History Eyes Patient has history of Cataracts - 2018 Ear/Nose/Mouth/Throat Patient has history of Middle ear problems Denies history of Chronic sinus problems/congestion Hematologic/Lymphatic Denies history of Anemia, Hemophilia, Human Immunodeficiency Virus, Lymphedema, Sickle Cell Disease Respiratory Patient has history of Sleep Apnea Denies history of Aspiration, Asthma, Chronic Obstructive Pulmonary Disease (COPD), Pneumothorax, Tuberculosis Cardiovascular Patient has history of Hypertension Denies history of Angina, Arrhythmia, Congestive Heart Failure, Coronary Artery Disease, Deep Vein Thrombosis, Hypotension, Myocardial Infarction, Peripheral Arterial Disease, Peripheral Venous Disease, Phlebitis, Vasculitis Gastrointestinal Denies history of Cirrhosis , Colitis, Crohn s, Hepatitis A, Hepatitis B, Hepatitis C Endocrine Patient has history of Type II Diabetes Genitourinary Denies history of End Stage Renal Disease Immunological Denies history of Lupus Erythematosus, Raynaud s, Scleroderma [name], [name] ([id number]) Musculoskeletal Patient has history of Gout - history, Osteoarthritis Denies history of Rheumatoid Arthritis, Osteomyelitis Neurologic Patient has history of Neuropathy Denies history of Dementia, Quadriplegia, Paraplegia, Seizure Disorder Oncologic Denies history of Received Chemotherapy, Received Radiation Medical And Surgical History Notes Cardiovascular CVA in 2015 Genitourinary "weak kidneys" - is a patient at [facility] ([facility]) Constitutional Symptoms (General Health) Denies complaints or symptoms of Fatigue, Fever, Chills, Marked Weight Change. Respiratory Denies complaints or symptoms of Chronic or frequent coughs, Shortness of Breath. Cardiovascular Denies complaints or symptoms of Chest pain, LE edema. Psychiatric Denies complaints or symptoms of Anxiety, Claustrophobia. Objective Constitutional Well-nourished and  well-hydrated in no acute distress. Vitals Time Taken: 8:30  AM, Height: 71 in, Weight: 205 lbs, BMI: 28.6, Temperature: 98.1 F, Pulse: 101 bpm, Respiratory Rate: 16 breaths/min, Blood Pressure: 182/81 mmHg. Respiratory normal breathing without difficulty. Psychiatric this patient is able to make decisions and demonstrates good insight into disease process. Alert and Oriented x 3. pleasant and cooperative. General Notes: His wound bed currently did require sharp debridement to clear away some of necrotic tissue from the surface of the wound which the patient tolerated today without complication and post debridement the wound bed appears to be doing significantly better which is good news. He is measuring smaller and I am hopeful this will continue to show signs of improvement going forward. Integumentary (Hair, Skin) Wound #5 status is Open. Original cause of wound was Pressure Injury. The wound is located on the Left,Lateral Foot. The wound measures 0.4cm length x 0.6cm width x 0.1cm depth; 0.188cm^2 area and 0.019cm^3 volume. There is Fat Layer (Subcutaneous Tissue) Exposed exposed. There is no tunneling or undermining noted. There is a medium amount of [name], Dustin E. ([id number]) serosanguineous drainage noted. The wound margin is flat and intact. There is medium (34-66%) pink granulation within the wound bed. There is a medium (34-66%) amount of necrotic tissue within the wound bed including Adherent Slough. 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 There was a Excisional Skin/Subcutaneous  Tissue Debridement with a total area of 0.24 sq cm performed by [name], Nemesis Rainwater E., [name]. With the following instrument(s): Curette to remove Non-Viable tissue/material. Material removed includes Callus, Subcutaneous Tissue, and Slough after achieving pain control using Lidocaine. No specimens were taken. A time out was conducted at 08:55, prior to the start of the procedure. A Minimum amount of bleeding was controlled with Pressure. The procedure was tolerated well with a pain level of 0 throughout and a pain level of 0 following the procedure. Post Debridement Measurements: 0.4cm length x 0.6cm width x 0.1cm depth; 0.019cm^3 volume. Character of Wound/Ulcer Post Debridement is improved. Severity of Tissue Post Debridement is: Fat layer exposed. Post procedure Diagnosis Wound #5: Same as Pre-Procedure Plan Wound Cleansing: Wound #5 Left,Lateral Foot: May shower with protection. Primary Wound Dressing: Wound #5 Left,Lateral Foot: Silver Collagen Secondary Dressing: Wound #5 Left,Lateral Foot: Conform/Kerlix - Rolled gauze Foam - To pad area Follow-up Appointments: Wound #5 Left,Lateral Foot: Return Appointment in 1 week. Off-Loading: Turn and reposition every 2 hours - Elevate foot when sitting Spearing, Quantavius E. ([id number]) 1. With regard to the patient's wound I am going to recommend at this time that we going continue with the silver collagen dressing. That does seem to be beneficial for him and I am hopeful that we will continue to be such. We will also cover this area with a foam pad and then subsequently rolled gauze to help pad of the region and hopefully help with offloading. 2. With regard to the patient's foot drop unfortunately we have not been able to get anyone that can get him a new brace for his external shoe in order to help with the foot drop which I think is also contributing to the ulcer issue. He has discussed this with his orthopedic doctor who wanted [place] to  see if  we can take care of that I did refer [facility] to [facility] unfortunately they have not been able to help him apparently his insurance will not pay for a new brace anytime soon. 3. With regard to the patient's diabetes this is now under better control although he was in the hospital in the past week over Christmas due to elevations in the range of 5-600. Fortunately he is down to 200 at this point he is following up with his managing physician regularly. We will see patient back for reevaluation in 1 week here in the clinic. If anything worsens or changes patient will contact our office for additional recommendations. Electronic Signature(s) Signed: 04/16/2019 9:06:03 AM By: Worthy Keeler [name] Entered By: Worthy Keeler on 04/16/2019 09:06:02 [name] ([id number]) -------------------------------------------------------------------------------- ROS/PFSH Details Patient Name: [name] Date of Service: 04/16/2019 8:15 AM Medical Record Number: [id number] Patient Account Number: [id number] Date of Birth/Sex: Nov 20, 1945 (73 y.o. M) Treating RN: Harold Barban Primary Care Provider: [name] Other Clinician: Referring Provider: [name] Treating Provider/Extender: Melburn Hake, Amaka Gluth Weeks in Treatment: 9 Information Obtained From Patient Constitutional Symptoms (General Health) Complaints and Symptoms: Negative for: Fatigue; Fever; Chills; Marked Weight Change Respiratory Complaints and Symptoms: Negative for: Chronic or frequent coughs; Shortness of Breath Medical History: Positive for: Sleep Apnea Negative for: Aspiration; Asthma; Chronic Obstructive Pulmonary Disease (COPD); Pneumothorax; Tuberculosis Cardiovascular Complaints and Symptoms: Negative for: Chest pain; LE edema Medical History: Positive for: Hypertension Negative for: Angina; Arrhythmia; Congestive Heart Failure; Coronary Artery Disease; Deep Vein Thrombosis; Hypotension; Myocardial  Infarction; Peripheral Arterial Disease; Peripheral Venous Disease; Phlebitis; Vasculitis Past Medical History Notes: CVA in 2015 Psychiatric Complaints and Symptoms: Negative for: Anxiety; Claustrophobia Eyes Medical History: Positive for: Cataracts - 2018 Ear/Nose/Mouth/Throat Medical History: Positive for: Middle ear problems Negative for: Chronic sinus problems/congestion Hematologic/Lymphatic Medical History: Negative for: Anemia; Hemophilia; Human Immunodeficiency Virus; Lymphedema; Sickle Cell Disease [name], [name] ([id number]) Gastrointestinal Medical History: Negative for: Cirrhosis ; Colitis; Crohnos; Hepatitis A; Hepatitis B; Hepatitis C Endocrine Medical History: Positive for: Type II Diabetes Time with diabetes: 20 yrs Treated with: Insulin Blood sugar tested every day: Yes Tested : TID Blood sugar testing results: Bedtime: 130 Genitourinary Medical History: Negative for: End Stage Renal Disease Past Medical History Notes: "weak kidneys" - is a patient at [facility] History: Negative for: Lupus Erythematosus; Raynaudos; Scleroderma Musculoskeletal Medical History: Positive for: Gout - history; Osteoarthritis Negative for: Rheumatoid Arthritis; Osteomyelitis Neurologic Medical History: Positive for: Neuropathy Negative for: Dementia; Quadriplegia; Paraplegia; Seizure Disorder Oncologic Medical History: Negative for: Received Chemotherapy; Received Radiation HBO Extended History Items Eyes: Ear/Nose/Mouth/Throat: Cataracts Middle ear problems Immunizations Pneumococcal Vaccine: Received Pneumococcal Vaccination: Yes Immunization Notes: up to date Implantable Devices None Family and Social History KYPTON, ELTRINGHAM ([id number]) Cancer: Yes - Siblings; Diabetes: Yes - Father; Heart Disease: No; Hypertension: Yes - Father; Kidney Disease: Yes - Mother; Lung Disease: No; Stroke: Yes - Father; Thyroid Problems: No;  Tuberculosis: No; Former smoker; Marital Status - Widowed; Alcohol Use: Never; Drug Use: No History; Caffeine Use: Moderate; Financial Concerns: No; Food, Clothing or Shelter Needs: No; Support System Lacking: No; Transportation Concerns: No Physician Affirmation I have reviewed and agree with the above information. Electronic Signature(s) Signed: 04/16/2019 4:45:12 PM By: Harold Barban Signed: 04/18/2019 6:44:22 PM By: Worthy Keeler [name] Entered By: Worthy Keeler on 04/16/2019 09:04:05 [name] ([id number]) -------------------------------------------------------------------------------- SuperBill Details Patient Name: [name] Date of Service: 04/16/2019 Medical  Record Number: [id number] Patient Account Number: [id number] Date of Birth/Sex: October 22, 1945 (74 y.o. M) Treating RN: Harold Barban Primary Care Provider: [name] Other Clinician: Referring Provider: [name] Treating Provider/Extender: Melburn Hake, Tayelor Osborne Weeks in Treatment: 9 Diagnosis Coding ICD-10 Codes Code Description E11.621 Type 2 diabetes mellitus with foot ulcer E11.42 Type 2 diabetes mellitus with diabetic polyneuropathy L97.522 Non-pressure chronic ulcer of other part of left foot with fat layer exposed M21.372 Foot drop, left foot Facility Procedures CPT4 Code: 60156153 Description: 79432 - DEB SUBQ TISSUE 20 SQ CM/< ICD-10 Diagnosis Description L97.522 Non-pressure chronic ulcer of other part of left foot with fat Modifier: layer exposed Quantity: 1 Physician Procedures CPT4 Code: 7614709 Description: 11042 - WC PHYS SUBQ TISS 20 SQ CM ICD-10 Diagnosis Description L97.522 Non-pressure chronic ulcer of other part of left foot with fat Modifier: layer exposed Quantity: 1 Electronic Signature(s) Signed: 04/16/2019 9:06:12 AM By: Worthy Keeler [name] Entered By: Worthy Keeler on 04/16/2019 [place]

## 2019-04-16 NOTE — Progress Notes (Signed)
BAIRON, KLEMANN ([id number]) Visit Report for 04/16/2019 Arrival Information Details Patient Name: [name], [name] Date of Service: 04/16/2019 8:15 AM Medical Record Number: [id number] Patient Account Number: [id number] Date of Birth/Sex: 02/03/1946 (74 y.o. M) Treating RN: Montey Hora Primary Care [place] Ambrosio: [name] Other Clinician: Referring Clema Skousen: [name] Treating Skyah Hannon/Extender: Melburn Hake, HOYT Weeks in Treatment: 9 Visit Information History Since Last Visit Added or deleted any medications: No Patient Arrived: [place] Any new allergies or adverse reactions: No Arrival Time: 08:27 Had a fall or experienced change in No Accompanied By: self activities of daily living that may affect Transfer Assistance: None risk of falls: Patient Identification Verified: Yes Signs or symptoms of abuse/neglect since last visito No Secondary Verification Process Completed: Yes Hospitalized since last visit: No Patient Has Alerts: Yes Implantable device outside of the clinic excluding No Patient Alerts: DMII cellular tissue based products placed in the center since last visit: Has Dressing in Place as Prescribed: Yes Pain Present Now: No Electronic Signature(s) Signed: 04/16/2019 4:34:19 PM By: Montey Hora Entered By: Montey Hora on 04/16/2019 08:28:26 [name] ([id number]) -------------------------------------------------------------------------------- Encounter Discharge Information Details Patient Name: [name] Date of Service: 04/16/2019 8:15 AM Medical Record Number: [id number] Patient Account Number: [id number] Date of Birth/Sex: [birth year] (73 y.o. M) Treating RN: Harold Barban Primary Care Usiel Astarita: [name] Other Clinician: Referring Nishan Ovens: [name] Treating Woodruff Skirvin/Extender: Melburn Hake, HOYT Weeks in Treatment: 9 Encounter Discharge Information Items Post Procedure Vitals Discharge Condition: Stable Temperature  (F): 98.1 Ambulatory Status: Cane Pulse (bpm): 101 Discharge Destination: Home Respiratory Rate (breaths/min): 18 Transportation: Private Auto Blood Pressure (mmHg): 182/81 Accompanied By: self Schedule Follow-up Appointment: Yes Clinical Summary of Care: Electronic Signature(s) Signed: 04/16/2019 4:45:12 PM By: Harold Barban Entered By: Harold Barban on 04/16/2019 09:02:03 [name] ([id number]) -------------------------------------------------------------------------------- Lower Extremity Assessment Details Patient Name: [name] Date of Service: 04/16/2019 8:15 AM Medical Record Number: [id number] Patient Account Number: [id number] Date of Birth/Sex: [birth year] (73 y.o. M) Treating RN: Montey Hora Primary Care Jevin Camino: [name] Other Clinician: Referring Taylee Gunnells: [name] Treating Sharalee Witman/Extender: [name], HOYT Weeks in Treatment: 9 Edema Assessment Assessed: [Left: No] [Right: No] Edema: [Left: N] [Right: o] Vascular Assessment Pulses: Dorsalis Pedis Palpable: [Left:Yes] Electronic Signature(s) Signed: 04/16/2019 4:34:19 PM By: Montey Hora Entered By: Montey Hora on 04/16/2019 08:33:48 [name] ([id number]) -------------------------------------------------------------------------------- Multi Wound Chart Details Patient Name: [name] Date of Service: 04/16/2019 8:15 AM Medical Record Number: [id number] Patient Account Number: [id number] Date of Birth/Sex: March 18, 1946 (73 y.o. M) Treating RN: Harold Barban Primary Care [place] Behlke: [name] Other Clinician: Referring Ridley Schewe: [name] Treating Samy Ryner/Extender: Melburn Hake, HOYT Weeks in Treatment: 9 Vital Signs Height(in): 71 Pulse(bpm): 101 Weight(lbs): 205 Blood Pressure(mmHg): 182/81 Body Mass Index(BMI): 29 Temperature(F): 98.1 Respiratory Rate 16 (breaths/min): Photos: [N/A:N/A] Wound Location: Left Foot - Lateral N/A  N/A Wounding Event: Pressure Injury N/A N/A Primary Etiology: Diabetic Wound/Ulcer of the N/A N/A Lower Extremity Comorbid History: Cataracts, Middle ear N/A N/A problems, Sleep Apnea, Hypertension, Type II Diabetes, Gout, Osteoarthritis, Neuropathy Date Acquired: 01/19/2019 N/A N/A Weeks of Treatment: 9 N/A N/A Wound Status: Open N/A N/A Measurements L x W x D 0.4x0.6x0.1 N/A N/A (cm) Area (cm) : 0.188 N/A N/A Volume (cm) : 0.019 N/A N/A % Reduction in Area: 31.60% N/A N/A % Reduction in Volume: 76.80% N/A N/A Classification: Grade 2 N/A N/A Exudate Amount: Medium N/A N/A Exudate Type: Serosanguineous N/A N/A Exudate Color: red, brown N/A N/A Wound Margin: Flat  and Intact N/A N/A Granulation Amount: Medium (34-66%) N/A N/A Granulation Quality: Pink N/A N/A Necrotic Amount: Medium (34-66%) N/A N/A Exposed Structures: Fat Layer (Subcutaneous N/A N/A Tissue) Exposed: Yes Fascia: No JOEZIAH, VOIT E. ([id number]) Tendon: No Muscle: No Joint: No Bone: No Epithelialization: None N/A N/A Treatment Notes Electronic Signature(s) Signed: 04/16/2019 4:45:12 PM By: Harold Barban Entered By: Harold Barban on 04/16/2019 08:54:44 [name] ([id number]) -------------------------------------------------------------------------------- Multi-Disciplinary Care Plan Details Patient Name: [name] Date of Service: 04/16/2019 8:15 AM Medical Record Number: [id number] Patient Account Number: [id number] Date of Birth/Sex: 05/07/1945 (73 y.o. M) Treating RN: Harold Barban Primary Care Tymika Grilli: [name] Other Clinician: Referring Jaclynn Laumann: [name] Treating Kia Stavros/Extender: Melburn Hake, HOYT Weeks in Treatment: 9 Active Inactive Electronic Signature(s) Signed: 04/16/2019 4:45:12 PM By: Harold Barban Entered By: Harold Barban on 04/16/2019 08:54:37 [name]  ([id number]) -------------------------------------------------------------------------------- Pain Assessment Details Patient Name: [name] Date of Service: 04/16/2019 8:15 AM Medical Record Number: [id number] Patient Account Number: [id number] Date of Birth/Sex: 05-11-1945 (73 y.o. M) Treating RN: Montey Hora Primary Care Fiana Gladu: [name] Other Clinician: Referring Dasja Brase: [name] Treating Pedrohenrique Mcconville/Extender: Melburn Hake, HOYT Weeks in Treatment: 9 Active Problems Location of Pain Severity and Description of Pain Patient Has Paino No Site Locations Pain Management and Medication Current Pain Management: Electronic Signature(s) Signed: 04/16/2019 4:34:19 PM By: Montey Hora Entered By: Montey Hora on 04/16/2019 29:92:42 [name] ([id number]) -------------------------------------------------------------------------------- Patient/Caregiver Education Details Patient Name: [name] Date of Service: 04/16/2019 8:15 AM Medical Record Number: [id number] Patient Account Number: [id number] Date of Birth/Gender: 1945-12-18 (73 y.o. M) Treating RN: Harold Barban Primary Care Physician: [name] Other Clinician: Referring Physician: [name] Treating Physician/Extender: Sharalyn Ink in Treatment: 9 Education Assessment Education Provided To: Patient Education Topics Provided Wound/Skin Impairment: Handouts: Caring for Your Ulcer Methods: Demonstration, Explain/Verbal Responses: State content correctly Electronic Signature(s) Signed: 04/16/2019 4:45:12 PM By: Harold Barban Entered By: Harold Barban on 04/16/2019 08:55:01 [name] ([id number]) -------------------------------------------------------------------------------- Wound Assessment Details Patient Name: [name] Date of Service: 04/16/2019 8:15 AM Medical Record Number: [id number] Patient Account Number: [id number] Date of Birth/Sex:  [birth year] (73 y.o. M) Treating RN: Montey Hora Primary Care Ainhoa Rallo: [name] Other Clinician: Referring Lamond Glantz: [name] Treating Malanie Koloski/Extender: [name], HOYT Weeks in Treatment: 9 Wound Status Wound Number: 5 Primary Diabetic Wound/Ulcer of the Lower Extremity Etiology: Wound Location: Left Foot - Lateral Wound Open Wounding Event: Pressure Injury Status: Date Acquired: 01/19/2019 Comorbid Cataracts, Middle ear problems, Sleep Apnea, Weeks Of Treatment: 9 History: Hypertension, Type II Diabetes, Gout, Clustered Wound: No Osteoarthritis, Neuropathy Photos Wound Measurements Length: (cm) 0.4 % Reduction in Width: (cm) 0.6 % Reduction in Depth: (cm) 0.1 Epithelializat Area: (cm) 0.188 Tunneling: Volume: (cm) 0.019 Undermining: Area: 31.6% Volume: 76.8% ion: None No No Wound Description Classification: Grade 2 Foul Odor Afte Wound Margin: Flat and Intact Slough/Fibrino Exudate Amount: Medium Exudate Type: Serosanguineous Exudate Color: red, brown r Cleansing: No Yes Wound Bed Granulation Amount: Medium (34-66%) Exposed Structure Granulation Quality: Pink Fascia Exposed: No Necrotic Amount: Medium (34-66%) Fat Layer (Subcutaneous Tissue) Exposed: Yes Necrotic Quality: Adherent Slough Tendon Exposed: No Muscle Exposed: No Joint Exposed: No Bone Exposed: No Treatment Notes LAVONNE, CASS ([id number]) Wound #5 (Left, Lateral Foot) Notes Silver collagen, Foam 2Layer , conform Electronic Signature(s) Signed: 04/16/2019 4:34:19 PM By: Montey Hora Entered By: Montey Hora on 04/16/2019 08:33:36 [name] ([id number]) -------------------------------------------------------------------------------- [place] Details Patient Name: [name] Date of  Service: 04/16/2019 8:15 AM Medical Record Number: [id number] Patient Account Number: [id number] Date of Birth/Sex: Jul 27, 1945 (73 y.o. M) Treating RN: Montey Hora Primary  Care Jaedah Lords: [name] Other Clinician: Referring Gizzelle Lacomb: [name] Treating Shaunette Gassner/Extender: [name], HOYT Weeks in Treatment: 9 Vital Signs Time Taken: 08:30 Temperature (F): 98.1 Height (in): 71 Pulse (bpm): 101 Weight (lbs): 205 Respiratory Rate (breaths/min): 16 Body Mass Index (BMI): 28.6 Blood Pressure (mmHg): 182/81 Reference Range: 80 - 120 mg / dl Electronic Signature(s) Signed: 04/16/2019 4:34:19 PM By: Montey Hora Entered By: Montey Hora on 04/16/2019 08:30:21

## 2019-04-17 NOTE — Progress Notes (Signed)
Anvita Hirata T. Demontray Franta, MD Primary Care and [place] [facility] [place], [address] Phone: [phone]  FAX: [phone]  [name] - 74 y.o. male  [name] [id number]  Date of Birth: 12/11/1945  Visit Date: 04/18/2019  PCP: [name], MD  Referred by: [name], MD  Chief Complaint  Patient presents with  . Hospitalization Follow-up    This visit occurred during the SARS-CoV-2 public health emergency.  Safety protocols were in place, including screening questions prior to the visit, additional usage of staff PPE, and extensive cleaning of exam room while observing appropriate contact time as indicated for disinfecting solutions.   Subjective:   [name] is a 74 y.o. very pleasant male patient who presents with the following:  Follow-up hospital admission.  Patient: [name] WFU:[id number]  PCP: [name], MD DOB: 1945/09/01  Date of admission: 04/06/2019             Date of discharge: 04/08/2019   The patient was admitted with some gastroenteritis.  He also has multiple medical comorbidities and has had some noncompliance and wildly out-of-control diabetes.  He has stated stage III chronic kidney disease as well as high blood pressure.  At the time of his hospitalization he had some hyponatremia as well as some urinary retention.  Hospital discharge consider possible urological consultation.  He received 2 L of IV fluid in the ER and his renal function returned to normal.  At the time of his admission he had not urinated in 3 days.  He also admits some of his insulin doses.  They placed a Foley catheter.  He also had some yeast on his urinalysis. He does also have an active foot ulcer.  Lab Results  Component Value Date   HGBA1C 12.0 (H) 04/07/2019    No retention now.   Hallucinating when the first day back home.  Goes to much to the bathroom.   140 - 400, 05/14/2019  endo appt.   BMP.    Past Medical History, Surgical History, Social History, Family History, Problem List, Medications, and Allergies have been reviewed and updated if relevant.  Patient Active Problem List   Diagnosis Date Noted  . Uncontrolled type 2 diabetes mellitus with peripheral neuropathy ([place]) 05/11/2016    Priority: Medium  . Former very heavy cigarette smoker (more than 40 per day) 10/07/2014    Priority: Medium  . Chronic kidney disease, stage III (moderate) 12/19/2012    Priority: Medium  . OSA (obstructive sleep apnea) 03/09/2011    Priority: Medium  . Diabetic neuropathy ([place]) 12/29/2010    Priority: Medium  . Hypertriglyceridemia 06/13/2008    Priority: Medium  . Major depressive disorder, recurrent episode, in partial remission ([place]) 06/13/2008    Priority: Medium  . Essential hypertension 06/13/2008    Priority: Medium  . Acute urinary retention 04/07/2019  . Dropfoot 03/15/2019  . Mild cognitive impairment 05/08/2018  . Poor balance 02/23/2018  . Esophageal dysphagia   . Erectile dysfunction associated with type 2 diabetes mellitus ([place]) 05/28/2015  . Hypogonadism male 06/14/2012  . Family history of MI (myocardial infarction) 09/16/2010  . HIP REPLACEMENT, RIGHT, HX OF 02/05/2010  . GOUT 06/13/2008  . ALLERGIC RHINITIS 06/13/2008  . GERD 06/13/2008  . OSTEOARTHRITIS 06/13/2008    Past Medical History:  Diagnosis Date  . Allergic rhinitis due to pollen   . Chronic kidney disease, stage III (moderate) 12/19/2012  . Depression   .  Diabetes mellitus ([place])   . Diverticulosis   . Erectile dysfunction associated with type 2 diabetes mellitus ([place]) 05/28/2015  . Former very heavy cigarette smoker (more than 40 per day) 10/07/2014  . GERD (gastroesophageal reflux disease)   . Gout   . Hyperlipidemia   . Hypertension   . Hypogonadism male 06/14/2012  . Iron deficiency   . Memory loss   . Osteoarthritis   . Personal history of colonic polyps    1996     Past Surgical History:  Procedure Laterality Date  . CARDIAC CATHETERIZATION  11/2011   [facility]  . CHOLECYSTECTOMY  1980  . ESOPHAGOGASTRODUODENOSCOPY (EGD) WITH PROPOFOL N/A 09/27/2017   Procedure: ESOPHAGOGASTRODUODENOSCOPY (EGD) WITH PROPOFOL;  Surgeon: Lin Landsman, MD;  Location: [facility];  Service: Gastroenterology;  Laterality: N/A;  . EYE SURGERY Right 07/29/2016   cataract - Dr. Manuella Ghazi  . EYE SURGERY Left 08/23/2106   cataract - Dr. Manuella Ghazi  . TOTAL HIP ARTHROPLASTY  2009   Dr. Gladstone Lighter  . TOTAL HIP ARTHROPLASTY      Social History   Socioeconomic History  . Marital status: Widowed    Spouse name: Not on file  . Number of children: 4  . Years of education: 12th  . Highest education level: Not on file  Occupational History  . Occupation: Counsellor  . Occupation: retired Magazine features editor: [employer]  Tobacco Use  . Smoking status: Former Smoker    Packs/day: 3.00    Years: 35.00    Pack years: 105.00    Types: Cigarettes    Quit date: 04/12/2000    Years since quitting: 19.0  . Smokeless tobacco: Never Used  Substance and Sexual Activity  . Alcohol use: No  . Drug use: No  . Sexual activity: Never  Other Topics Concern  . Not on file  Social History Narrative   No regular exercise   Caffeine use: yes, dt coke   Lives at home with his mother lives with him.   Wife Fraser Din recently deceased   Right-handed.   Social Determinants of Health   Financial Resource Strain: Low Risk   . Difficulty of Paying Living Expenses: Not hard at all  Food Insecurity: No Food Insecurity  . Worried About Charity fundraiser in the Last Year: Never true  . Ran Out of Food in the Last Year: Never true  Transportation Needs: No Transportation Needs  . Lack of Transportation (Medical): No  . Lack of Transportation (Non-Medical): No  Physical Activity: Inactive  . Days of Exercise per Week: 0 days  . Minutes of Exercise per Session: 0 min  Stress: No Stress  Concern Present  . Feeling of Stress : Not at all  Social Connections:   . Frequency of Communication with Friends and Family: Not on file  . Frequency of Social Gatherings with Friends and Family: Not on file  . Attends Religious Services: Not on file  . Active Member of Clubs or Organizations: Not on file  . Attends Archivist Meetings: Not on file  . Marital Status: Not on file  Intimate Partner Violence: Not At Risk  . Fear of Current or Ex-Partner: No  . Emotionally Abused: No  . Physically Abused: No  . Sexually Abused: No    Family History  Problem Relation Age of Onset  . Diabetes Mother   . Alzheimer's disease Mother   . Diabetes Father   . Lung cancer Father  Age 20  . Stroke Father   . Heart attack Father   . Lung cancer Sister        lung  . Heart disease Maternal Grandfather   . COPD Sister   . Heart attack Sister   . Heart disease Sister     Allergies  Allergen Reactions  . Tetracycline Itching and Rash    Medication list reviewed and updated in full in [facility].   GEN: No acute illnesses, no fevers, chills. GI: No n/v/d, eating normally Pulm: No SOB Interactive and getting along well at home.  Otherwise, ROS is as per the HPI.  Objective:   BP 140/66   Pulse 94   Temp 98 F (36.7 C) (Temporal)   Ht 5' 11.5" (1.816 m)   Wt 196 lb 8 oz (89.1 kg)   SpO2 98%   BMI 27.02 kg/m   GEN: WDWN, NAD, Non-toxic, A & O x 3 HEENT: Atraumatic, Normocephalic. Neck supple. No masses, No LAD. Ears and Nose: No external deformity. CV: RRR, No M/G/R. No JVD. No thrill. No extra heart sounds. PULM: CTA B, no wheezes, crackles, rhonchi. No retractions. No resp. distress. No accessory muscle use. EXTR: No c/c/e NEURO Normal gait.  PSYCH: Normally interactive. Conversant. Not depressed or anxious appearing.  Calm demeanor.   Laboratory and Imaging Data: Results for orders placed or performed in visit on 16/10/96  Basic metabolic  panel  Result Value Ref Range   Sodium 130 (L) 135 - 145 mEq/L   Potassium 4.5 3.5 - 5.1 mEq/L   Chloride 92 (L) 96 - 112 mEq/L   CO2 30 19 - 32 mEq/L   Glucose, Bld 355 (H) 70 - 99 mg/dL   BUN 23 6 - 23 mg/dL   Creatinine, Ser 1.50 0.40 - 1.50 mg/dL   GFR 45.82 (L) >60.00 mL/min   Calcium 9.7 8.4 - 10.5 mg/dL    Lab Results  Component Value Date   HGBA1C 12.0 (H) 04/07/2019    CMP Latest Ref Rng & Units 04/18/2019 04/08/2019 04/07/2019  Glucose 70 - 99 mg/dL 355(H) 335(H) 280(H)  BUN 6 - 23 mg/dL 23 30(H) 25(H)  Creatinine 0.40 - 1.50 mg/dL 1.50 2.07(H) 2.12(H)  Sodium 135 - 145 mEq/L 130(L) 130(L) 133(L)  Potassium 3.5 - 5.1 mEq/L 4.5 3.7 3.7  Chloride 96 - 112 mEq/L 92(L) 98 95(L)  CO2 19 - 32 mEq/L 30 24 25   Calcium 8.4 - 10.5 mg/dL 9.7 8.3(L) 8.6(L)  Total Protein 6.5 - 8.1 g/dL - - -  Total Bilirubin 0.3 - 1.2 mg/dL - - -  Alkaline Phos 38 - 126 U/L - - -  AST 15 - 41 U/L - - -  ALT 0 - 44 U/L - - -   CBC Latest Ref Rng & Units 04/08/2019 04/07/2019 04/06/2019  WBC 4.0 - 10.5 K/uL 7.5 7.4 7.7  Hemoglobin 13.0 - 17.0 g/dL 10.7(L) 12.9(L) 14.8  Hematocrit 39.0 - 52.0 % 31.3(L) 36.4(L) 41.7  Platelets 150 - 400 K/uL 164 206 245    Assessment and Plan:     ICD-10-CM   1. Urinary retention  R33.9   2. Viral gastroenteritis  A08.4   3. Uncontrolled type 2 diabetes mellitus with peripheral neuropathy (HCC)  E11.42    E11.65   4. Stage 3a chronic kidney disease  E45.40 Basic metabolic panel  5. Diabetic polyneuropathy associated with diabetes mellitus due to underlying condition (HCC)  E08.42   6. Former very heavy cigarette smoker (  more than 40 per day)  Z87.891   7. Essential hypertension  I10   8. Hypogonadism male  E29.1 testosterone cypionate (DEPOTESTOSTERONE CYPIONATE) injection 150 mg   Complex medical patient with extremely poorly controlled diabetes and noncompliance.  Challenging case.  I am not sure how much I can add to this case, and hopefully endocrine  will be able to help him.  Gastroenteritis is resolved.  Urinary retention is resolved.  Increased urination most likely secondary to extremely high blood sugars.  Nausea, suspect that this is all from diabetes, but as needed Zofran is certainly reasonable.  Recheck renal function secondary to acute on chronic renal failure from hospitalization and dehydration.  Follow-up: Return in about 3 months (around 07/17/2019).  Meds ordered this encounter  Medications  . ondansetron (ZOFRAN-ODT) 8 MG disintegrating tablet    Sig: Take 1 tablet (8 mg total) by mouth 2 (two) times daily.    Dispense:  60 tablet    Refill:  2  . testosterone cypionate (DEPOTESTOSTERONE CYPIONATE) injection 150 mg   Orders Placed This Encounter  Procedures  . Basic metabolic panel    Signed,  Frederico Hamman T. Kieana Livesay, MD   Outpatient Encounter Medications as of 04/18/2019  Medication Sig  . acetaminophen (QC ACETAMINOPHEN 8HR ARTH PAIN) 650 MG CR tablet Take 650 mg by mouth every 8 (eight) hours as needed for pain.  [name] Kitchen ampicillin (PRINCIPEN) 500 MG capsule Take 500 mg by mouth daily.  . Ascorbic Acid (VITAMIN C) 1000 MG tablet Take 1,000 mg by mouth daily.  [name] Kitchen aspirin EC 81 MG tablet Take 81 mg by mouth at bedtime.  [name] Kitchen atenolol (TENORMIN) 50 MG tablet TAKE 1 TABLET BY MOUTH EVERY DAY  . cholecalciferol (VITAMIN D) 1000 UNITS tablet Take 1,000 Units by mouth daily.    . colchicine 0.6 MG tablet Take 1 tablet (0.6 mg total) by mouth 2 (two) times daily.  [name] Kitchen donepezil (ARICEPT) 10 MG tablet Take 1 tablet (10 mg total) by mouth at bedtime.  [name] Kitchen doxazosin (CARDURA) 8 MG tablet TAKE ONE-HALF TABLET AT BEDTIME  . feeding supplement, GLUCERNA SHAKE, (GLUCERNA SHAKE) LIQD Take 237 mLs by mouth 3 (three) times daily between meals.  . finasteride (PROSCAR) 5 MG tablet Take 1 tablet (5 mg total) by mouth daily.  . fluconazole (DIFLUCAN) 100 MG tablet Take 1 tablet (100 mg total) by mouth daily.  . fluticasone (FLONASE) 50 MCG/ACT  nasal spray PLACE 2 SPRAYS IN EACH NOSTRIL DAILY  . ibuprofen (ADVIL,MOTRIN) 200 MG tablet Take 200-400 mg by mouth every 6 (six) hours as needed for headache (pain).  . indomethacin (INDOCIN) 50 MG capsule Take 1 capsule (50 mg total) by mouth 2 (two) times daily with a meal.  . insulin NPH Human (HUMULIN N,NOVOLIN N) 100 UNIT/ML injection Inject 0.25 mLs (25 Units total) into the skin 2 (two) times daily before a meal.  . insulin regular (NOVOLIN R,HUMULIN R) 100 units/mL injection Inject 0.2 mLs (20 Units total) into the skin 2 (two) times daily before a meal.  . lisinopril (ZESTRIL) 10 MG tablet TAKE 1 TABLET BY MOUTH EVERY DAY  . memantine (NAMENDA) 10 MG tablet Take 1 tablet (10 mg total) by mouth 2 (two) times daily.  . nutrition supplement, JUVEN, (JUVEN) PACK Take 1 packet by mouth 2 (two) times daily between meals.  [name] Kitchen omeprazole (PRILOSEC) 40 MG capsule Take 1 capsule (40 mg total) by mouth 2 (two) times daily before a meal.  . sildenafil (REVATIO) 20 MG tablet  Generic Revatio / Sildanefil 20 mg. 2 - 5 tabs 30 mins prior to intercourse.  . simvastatin (ZOCOR) 40 MG tablet Take 1 tablet (40 mg total) by mouth at bedtime.  [name] Kitchen testosterone cypionate (DEPOTESTOSTERONE CYPIONATE) 200 MG/ML injection Inject 0.75 mLs (150 mg total) into the muscle every 14 (fourteen) days.  . valACYclovir (VALTREX) 1000 MG tablet Take 1,000 mg by mouth 3 (three) times daily.  [name] Kitchen venlafaxine XR (EFFEXOR-XR) 75 MG 24 hr capsule TAKE 1 CAPSULE BY MOUTH EVERY MORNING AND 2 CAPSULES AT BEDTIME  . ondansetron (ZOFRAN) 4 MG tablet Take 1 tablet (4 mg total) by mouth every 8 (eight) hours as needed for nausea or vomiting. (Patient not taking: Reported on 04/07/2019)  . ondansetron (ZOFRAN-ODT) 8 MG disintegrating tablet Take 1 tablet (8 mg total) by mouth 2 (two) times daily.  . Semaglutide,0.25 or 0.5MG /DOS, (OZEMPIC, 0.25 OR 0.5 MG/DOSE,) 2 MG/1.5ML SOPN Inject 0.5 mg into the skin once a week. (Patient not taking:  Reported on 04/18/2019)  . [DISCONTINUED] ondansetron (ZOFRAN-ODT) 8 MG disintegrating tablet Take 1 tablet (8 mg total) by mouth every 8 (eight) hours as needed for nausea. (Patient not taking: Reported on 04/18/2019)   Facility-Administered Encounter Medications as of 04/18/2019  Medication  . testosterone cypionate (DEPOTESTOSTERONE CYPIONATE) injection 100 mg  . testosterone cypionate (DEPOTESTOSTERONE CYPIONATE) injection 150 mg  . testosterone cypionate (DEPOTESTOSTERONE CYPIONATE) injection 150 mg  . [COMPLETED] testosterone cypionate (DEPOTESTOSTERONE CYPIONATE) injection 150 mg

## 2019-04-18 ENCOUNTER — Ambulatory Visit (INDEPENDENT_AMBULATORY_CARE_PROVIDER_SITE_OTHER): Payer: Medicare Other | Admitting: Family Medicine

## 2019-04-18 ENCOUNTER — Ambulatory Visit: Payer: Medicare Other

## 2019-04-18 ENCOUNTER — Other Ambulatory Visit: Payer: Self-pay

## 2019-04-18 ENCOUNTER — Encounter: Payer: Self-pay | Admitting: Family Medicine

## 2019-04-18 VITALS — BP 140/66 | HR 94 | Temp 98.0°F | Ht 71.5 in | Wt 196.5 lb

## 2019-04-18 DIAGNOSIS — IMO0002 Reserved for concepts with insufficient information to code with codable children: Secondary | ICD-10-CM

## 2019-04-18 DIAGNOSIS — E291 Testicular hypofunction: Secondary | ICD-10-CM | POA: Diagnosis not present

## 2019-04-18 DIAGNOSIS — N1831 Chronic kidney disease, stage 3a: Secondary | ICD-10-CM

## 2019-04-18 DIAGNOSIS — Z87891 Personal history of nicotine dependence: Secondary | ICD-10-CM

## 2019-04-18 DIAGNOSIS — R339 Retention of urine, unspecified: Secondary | ICD-10-CM

## 2019-04-18 DIAGNOSIS — E1142 Type 2 diabetes mellitus with diabetic polyneuropathy: Secondary | ICD-10-CM

## 2019-04-18 DIAGNOSIS — A084 Viral intestinal infection, unspecified: Secondary | ICD-10-CM | POA: Diagnosis not present

## 2019-04-18 DIAGNOSIS — I10 Essential (primary) hypertension: Secondary | ICD-10-CM

## 2019-04-18 DIAGNOSIS — E0842 Diabetes mellitus due to underlying condition with diabetic polyneuropathy: Secondary | ICD-10-CM

## 2019-04-18 DIAGNOSIS — E1165 Type 2 diabetes mellitus with hyperglycemia: Secondary | ICD-10-CM

## 2019-04-18 LAB — BASIC METABOLIC PANEL
BUN: 23 mg/dL (ref 6–23)
CO2: 30 mEq/L (ref 19–32)
Calcium: 9.7 mg/dL (ref 8.4–10.5)
Chloride: 92 mEq/L — ABNORMAL LOW (ref 96–112)
Creatinine, Ser: 1.5 mg/dL (ref 0.40–1.50)
GFR: 45.82 mL/min — ABNORMAL LOW (ref >60.00)
Glucose, Bld: 355 mg/dL — ABNORMAL HIGH (ref 70–99)
Potassium: 4.5 mEq/L (ref 3.5–5.1)
Sodium: 130 mEq/L — ABNORMAL LOW (ref 135–145)

## 2019-04-18 MED ORDER — TESTOSTERONE CYPIONATE 200 MG/ML IM SOLN
150.0000 mg | Freq: Once | INTRAMUSCULAR | Status: AC
  Administered 2019-04-18: 150 mg via INTRAMUSCULAR

## 2019-04-18 MED ORDER — ONDANSETRON 8 MG PO TBDP: 8.0000 mg | ORAL_TABLET | Freq: Two times a day (BID) | ORAL | 2 refills | Status: DC

## 2019-04-18 NOTE — Progress Notes (Signed)
MAR, WALMER ([id number]) Visit Report for 04/04/2019 Arrival Information Details Patient Name: [name], [name] Date of Service: 04/04/2019 1:15 PM Medical Record Number: [id number] Patient Account Number: [id number] Date of Birth/Sex: Dec 25, 1945 (73 y.o. M) Treating RN: Cornell Barman Primary Care Shardae Kleinman: [name] Other Clinician: Referring Maykayla Highley: [name] Treating Levita Monical/Extender: Tito Dine in Treatment: 7 Visit Information History Since Last Visit Added or deleted any medications: No Patient Arrived: Walker Any new allergies or adverse reactions: No Arrival Time: 13:30 Had a fall or experienced change in No Accompanied By: self activities of daily living that may affect Transfer Assistance: None risk of falls: Patient Identification Verified: Yes Signs or symptoms of abuse/neglect since last visito No Secondary Verification Process Completed: Yes Hospitalized since last visit: No Patient Has Alerts: Yes Has Dressing in Place as Prescribed: Yes Patient Alerts: DMII Has Footwear/Offloading in Place as Prescribed: Yes Left: Total Contact Cast Pain Present Now: No Electronic Signature(s) Signed: 04/04/2019 4:03:03 PM By: Lorine Bears RCP, RRT, CHT Entered By: Lorine Bears on 04/04/2019 13:31:35 [name] ([id number]) -------------------------------------------------------------------------------- Clinic Level of Care Assessment Details Patient Name: [name] Date of Service: 04/04/2019 1:15 PM Medical Record Number: [id number] Patient Account Number: [id number] Date of Birth/Sex: 06/15/1945 (73 y.o. M) Treating RN: Army Melia Primary Care Crystina Borrayo: [name] Other Clinician: Referring Gabby Rackers: [name] Treating Jacinta Penalver/Extender: Tito Dine in Treatment: 7 Clinic Level of Care Assessment Items TOOL 4 Quantity Score []  - Use when only an EandM is performed  on FOLLOW-UP visit 0 ASSESSMENTS - Nursing Assessment / Reassessment X - Reassessment of Co-morbidities (includes updates in patient status) 1 10 X- 1 5 Reassessment of Adherence to Treatment Plan ASSESSMENTS - Wound and Skin Assessment / Reassessment X - Simple Wound Assessment / Reassessment - one wound 1 5 []  - 0 Complex Wound Assessment / Reassessment - multiple wounds []  - 0 Dermatologic / Skin Assessment (not related to wound area) ASSESSMENTS - Focused Assessment []  - Circumferential Edema Measurements - multi extremities 0 []  - 0 Nutritional Assessment / Counseling / Intervention []  - 0 Lower Extremity Assessment (monofilament, tuning fork, pulses) []  - 0 Peripheral Arterial Disease Assessment (using hand held doppler) ASSESSMENTS - Ostomy and/or Continence Assessment and Care []  - Incontinence Assessment and Management 0 []  - 0 Ostomy Care Assessment and Management (repouching, etc.) PROCESS - Coordination of Care X - Simple Patient / Family Education for ongoing care 1 15 []  - 0 Complex (extensive) Patient / Family Education for ongoing care X- 1 10 Staff obtains Programmer, systems, Records, Test Results / Process Orders []  - 0 Staff telephones HHA, Nursing Homes / Clarify orders / etc []  - 0 Routine Transfer to another Facility (non-emergent condition) []  - 0 Routine Hospital Admission (non-emergent condition) []  - 0 New Admissions / Biomedical engineer / Ordering NPWT, Apligraf, etc. []  - 0 Emergency Hospital Admission (emergent condition) X- 1 10 Simple Discharge Coordination AIDRIC, ENDICOTT ([id number]) []  - 0 Complex (extensive) Discharge Coordination PROCESS - Special Needs []  - Pediatric / Minor Patient Management 0 []  - 0 Isolation Patient Management []  - 0 Hearing / Language / Visual special needs []  - 0 Assessment of Community assistance (transportation, D/C planning, etc.) []  - 0 Additional assistance / Altered mentation []  - 0 Support  Surface(s) Assessment (bed, cushion, seat, etc.) INTERVENTIONS - Wound Cleansing / Measurement X - Simple Wound Cleansing - one wound 1 5 []  - 0 Complex Wound Cleansing - multiple wounds X-  1 5 Wound Imaging (photographs - any number of wounds) []  - 0 Wound Tracing (instead of photographs) X- 1 5 Simple Wound Measurement - one wound []  - 0 Complex Wound Measurement - multiple wounds INTERVENTIONS - Wound Dressings []  - Small Wound Dressing one or multiple wounds 0 X- 1 15 Medium Wound Dressing one or multiple wounds []  - 0 Large Wound Dressing one or multiple wounds []  - 0 Application of Medications - topical []  - 0 Application of Medications - injection INTERVENTIONS - Miscellaneous []  - External ear exam 0 []  - 0 Specimen Collection (cultures, biopsies, blood, body fluids, etc.) []  - 0 Specimen(s) / Culture(s) sent or taken to Lab for analysis []  - 0 Patient Transfer (multiple staff / Civil Service fast streamer / Similar devices) []  - 0 Simple Staple / Suture removal (25 or less) []  - 0 Complex Staple / Suture removal (26 or more) []  - 0 Hypo / Hyperglycemic Management (close monitor of Blood Glucose) []  - 0 Ankle / Brachial Index (ABI) - do not check if billed separately X- 1 5 Vital Signs [name], Dustin E. ([id number]) Has the patient been seen at the hospital within the last three years: Yes Total Score: 90 Level Of Care: New/Established - Level 3 Electronic Signature(s) Signed: 04/04/2019 4:11:28 PM By: Army Melia Entered By: Army Melia on 04/04/2019 15:18:16 [name] ([id number]) -------------------------------------------------------------------------------- Encounter Discharge Information Details Patient Name: [name] Date of Service: 04/04/2019 1:15 PM Medical Record Number: [id number] Patient Account Number: [id number] Date of Birth/Sex: [birth year] (73 y.o. M) Treating RN: Army Melia Primary Care Shebra Muldrow: [name] Other  Clinician: Referring Maximus Hoffert: [name] Treating Takita Riecke/Extender: Tito Dine in Treatment: 7 Encounter Discharge Information Items Discharge Condition: Stable Ambulatory Status: Walker Discharge Destination: Home Transportation: Private Auto Accompanied By: self Schedule Follow-up Appointment: Yes Clinical Summary of Care: Electronic Signature(s) Signed: 04/04/2019 3:17:48 PM By: Army Melia Entered By: Army Melia on 04/04/2019 15:17:47 [name] ([id number]) -------------------------------------------------------------------------------- Pain Assessment Details Patient Name: [name] Date of Service: 04/04/2019 1:15 PM Medical Record Number: [id number] Patient Account Number: [id number] Date of Birth/Sex: Jul 04, 1945 (73 y.o. M) Treating RN: Cornell Barman Primary Care Markeria Goetsch: [name] Other Clinician: Referring Maudie Shingledecker: [name] Treating Dallen Bunte/Extender: Tito Dine in Treatment: 7 Active Problems Location of Pain Severity and Description of Pain Patient Has Paino No Site Locations Pain Management and Medication Current Pain Management: Electronic Signature(s) Signed: 04/04/2019 4:03:03 PM By: Lorine Bears RCP, RRT, CHT Signed: 04/18/2019 4:51:46 PM By: Gretta Cool, BSN, RN, CWS, Kim RN, BSN Entered By: Lorine Bears on 04/04/2019 13:31:46 [name] ([id number]) -------------------------------------------------------------------------------- Wound Assessment Details Patient Name: [name] Date of Service: 04/04/2019 1:15 PM Medical Record Number: [id number] Patient Account Number: [id number] Date of Birth/Sex: 1945-12-15 (73 y.o. M) Treating RN: Army Melia Primary Care Avila Albritton: [name] Other Clinician: Referring Sharmeka Palmisano: [name] Treating Sterling Mondo/Extender: Ricard Dillon Weeks in Treatment: 7 Wound Status Wound Number: 5 Primary Diabetic  Wound/Ulcer of the Lower Etiology: Extremity Wound Location: Left, Lateral Foot Wound Status: Open Wounding Event: Pressure Injury Date Acquired: 01/19/2019 Weeks Of Treatment: 7 Clustered Wound: No Wound Measurements Length: (cm) 0.9 Width: (cm) 1.1 Depth: (cm) 0.1 Area: (cm) 0.778 Volume: (cm) 0.078 % Reduction in Area: -182.9% % Reduction in Volume: 4.9% Wound Description Classification: Grade 2 Electronic Signature(s) Signed: 04/04/2019 4:11:28 PM By: Army Melia Entered By: Army Melia on 04/04/2019 15:17:14 [name] ([id number]) -------------------------------------------------------------------------------- Vitals Details Patient Name: Dustin Goodness  E. Date of Service: 04/04/2019 1:15 PM Medical Record Number: [id number] Patient Account Number: [id number] Date of Birth/Sex: 02-Oct-1945 (74 y.o. M) Treating RN: Cornell Barman Primary Care Sherlyn Ebbert: [name] Other Clinician: Referring Palmyra Rogacki: [name] Treating Angelicia Lessner/Extender: Tito Dine in Treatment: 7 Vital Signs Time Taken: 13:28 Temperature (F): 98.1 Height (in): 71 Pulse (bpm): 121 Weight (lbs): 205 Respiratory Rate (breaths/min): 16 Body Mass Index (BMI): 28.6 Blood Pressure (mmHg): 174/87 Reference Range: 80 - 120 mg / dl Electronic Signature(s) Signed: 04/04/2019 4:03:03 PM By: Lorine Bears RCP, RRT, CHT Entered By: Lorine Bears on 04/04/2019 13:33:04

## 2019-04-23 ENCOUNTER — Encounter: Payer: Medicare Other | Admitting: Physician Assistant

## 2019-04-28 ENCOUNTER — Other Ambulatory Visit: Payer: Self-pay | Admitting: Family Medicine

## 2019-04-30 ENCOUNTER — Other Ambulatory Visit: Payer: Self-pay

## 2019-04-30 ENCOUNTER — Encounter: Payer: Medicare Other | Admitting: Physician Assistant

## 2019-04-30 DIAGNOSIS — M1990 Unspecified osteoarthritis, unspecified site: Secondary | ICD-10-CM | POA: Diagnosis not present

## 2019-04-30 DIAGNOSIS — I252 Old myocardial infarction: Secondary | ICD-10-CM | POA: Diagnosis not present

## 2019-04-30 DIAGNOSIS — Z87891 Personal history of nicotine dependence: Secondary | ICD-10-CM | POA: Diagnosis not present

## 2019-04-30 DIAGNOSIS — M21372 Foot drop, left foot: Secondary | ICD-10-CM | POA: Diagnosis not present

## 2019-04-30 DIAGNOSIS — I10 Essential (primary) hypertension: Secondary | ICD-10-CM | POA: Diagnosis not present

## 2019-04-30 DIAGNOSIS — L97522 Non-pressure chronic ulcer of other part of left foot with fat layer exposed: Secondary | ICD-10-CM | POA: Diagnosis not present

## 2019-04-30 DIAGNOSIS — Z809 Family history of malignant neoplasm, unspecified: Secondary | ICD-10-CM | POA: Diagnosis not present

## 2019-04-30 DIAGNOSIS — E1142 Type 2 diabetes mellitus with diabetic polyneuropathy: Secondary | ICD-10-CM | POA: Diagnosis not present

## 2019-04-30 DIAGNOSIS — M109 Gout, unspecified: Secondary | ICD-10-CM | POA: Diagnosis not present

## 2019-04-30 DIAGNOSIS — Z823 Family history of stroke: Secondary | ICD-10-CM | POA: Diagnosis not present

## 2019-04-30 DIAGNOSIS — Z8249 Family history of ischemic heart disease and other diseases of the circulatory system: Secondary | ICD-10-CM | POA: Diagnosis not present

## 2019-04-30 DIAGNOSIS — E11621 Type 2 diabetes mellitus with foot ulcer: Secondary | ICD-10-CM | POA: Diagnosis not present

## 2019-04-30 DIAGNOSIS — Z794 Long term (current) use of insulin: Secondary | ICD-10-CM | POA: Diagnosis not present

## 2019-04-30 DIAGNOSIS — E1151 Type 2 diabetes mellitus with diabetic peripheral angiopathy without gangrene: Secondary | ICD-10-CM | POA: Diagnosis not present

## 2019-04-30 DIAGNOSIS — Z833 Family history of diabetes mellitus: Secondary | ICD-10-CM | POA: Diagnosis not present

## 2019-04-30 NOTE — Progress Notes (Addendum)
SADAT, SLIWA ([id number]) Visit Report for 04/30/2019 Chief Complaint Document Details Patient Name: [name], [name] Date of Service: 04/30/2019 10:30 AM Medical Record Number: [id number] Patient Account Number: [id number] Date of Birth/Sex: 07-Jul-1945 (73 y.o. M) Treating RN: Cornell Barman Primary Care Provider: [name] Other Clinician: Referring Provider: [name] Treating Provider/Extender: Melburn Hake, Milley Vining Weeks in Treatment: 11 Information Obtained from: Patient Chief Complaint Left dorsal foot ulcer 02/09/2019; patient is here for review of the wound on the left lateral foot roughly at the level of the base of the fifth metatarsal Electronic Signature(s) Signed: 04/30/2019 10:50:59 AM By: Worthy Keeler [name] Entered By: Worthy Keeler on 04/30/2019 10:50:58 [name] ([id number]) -------------------------------------------------------------------------------- Debridement Details Patient Name: [name] Date of Service: 04/30/2019 10:30 AM Medical Record Number: [id number] Patient Account Number: [id number] Date of Birth/Sex: 1945-07-31 (73 y.o. M) Treating RN: Cornell Barman Primary Care Provider: [name] Other Clinician: Referring Provider: [name] Treating Provider/Extender: Melburn Hake, Federica Allport Weeks in Treatment: 11 Debridement Performed for Wound #5 Left,Lateral Foot Assessment: Performed By: Physician [name], Costantino Kohlbeck E., [name] Debridement Type: Debridement Severity of Tissue Pre Fat layer exposed Debridement: Level of Consciousness (Pre- Awake and Alert procedure): Pre-procedure Verification/Time Yes - 10:52 Out Taken: Start Time: 10:52 Pain Control: Lidocaine Total Area Debrided (L x W): 0.6 (cm) x 0.6 (cm) = 0.36 (cm) Tissue and other material Viable, Non-Viable, Callus, Slough, Subcutaneous, Slough debrided: Level: Skin/Subcutaneous Tissue Debridement Description: Excisional Instrument: Curette Bleeding:  Minimum Hemostasis Achieved: Pressure End Time: 10:56 Response to Treatment: Procedure was tolerated well Level of Consciousness Awake and Alert (Post-procedure): Post Debridement Measurements of Total Wound Length: (cm) 0.6 Width: (cm) 0.6 Depth: (cm) 0.2 Volume: (cm) 0.057 Character of Wound/Ulcer Post Debridement: Stable Severity of Tissue Post Debridement: Fat layer exposed Post Procedure Diagnosis Same as Pre-procedure Electronic Signature(s) Signed: 04/30/2019 4:47:43 PM By: Worthy Keeler [name] Signed: 04/30/2019 5:04:31 PM By: Gretta Cool, BSN, RN, CWS, Kim RN, BSN Entered By: Gretta Cool, BSN, RN, CWS, Kim on 04/30/2019 10:57:18 [name] ([id number]) -------------------------------------------------------------------------------- HPI Details Patient Name: [name] Date of Service: 04/30/2019 10:30 AM Medical Record Number: [id number] Patient Account Number: [id number] Date of Birth/Sex: [birth year] (73 y.o. M) Treating RN: Cornell Barman Primary Care Provider: [name] Other Clinician: Referring Provider: [name] Treating Provider/Extender: Melburn Hake, Destina Mantei Weeks in Treatment: 11 History of Present Illness HPI Description: 10/04/16 on evaluation today patient presents for initial evaluation concerning a laceration of the right wrist and hand on the bowler aspect. He tells me that he fell into glass following a medication change in regard to his diabetes. He initially went to [facility] ER where she was sutured and subsequently his daughter who is a Marine scientist removed the teachers at the appropriate time. Unfortunately the wound has done poorly and dehissed which has caused him some trouble including infection. He has been on antibiotics both topically and orally though the wound continues to show signs of necrotic tissue according to notes which is what he was sent to [place] for evaluation concerning. He does have diabetes and unfortunately this is severely  uncontrolled. He does see an endocrinologist and is working on this. 10/11/2016 -- the patient's notes from the ER were reviewed and he had his suturing done on 09/17/2016 and had necrotic skin flaps which had to be trimmed the last visit he was here. He has managed to get his Santyl ointment and has been using this appropriately. 10/21/16 patient's wound appears to be  doing very well on evaluation today. He still has some Slough covering the wound bed but this is nowhere near as severe as when i saw this initially two weeks ago nor even my review of his pictures last week. I'm pleased with how this is progressing. Patient's wound over the right form appears to be doing fairly well on evaluation today. There is no evidence of infection and he has a small area at the crease of where she is wrist extends and flexes that is still open and appears to be having difficulty due to the fact that it is cracking open. I think this is something that may be controlled with moisture control that is keeping the wound a little bit more moist. Nonetheless this is a tiny region compared to the initial wound and overall appears to be doing very well. No fevers, chills, nausea, or vomiting noted at this time. 11/25/16 on evaluation today patient appears to be doing well in regard to his right wrist ulcer. In fact this appears to be completely healed he is having no discomfort. He is pleased with how this is progressed. Readmission: 06/24/17 on evaluation today patient appears back in our clinic regarding issues that he has been having with his right ankle on the lateral malleolus as well is in the dorsal surface of his left foot. Both have been going on for several weeks unfortunately. He tells me that initially this was being managed by Dr. Edilia Bo and I did review the notes from Dr. Edilia Bo in epic. Patient was placed on amoxicillin initially and then subsequently Keflex following. He has been using triple  antibiotic ointment over-the-counter along with a Band-Aid for the wound currently. He did not have any Santyl remaining. His most recent hemoglobin A1c which was just two days ago was 10.3. Fortunately he seems to have excellent blood flow in the bilateral lower extremities with his left ABI being 0.95 in the right ABI 1.01. Patient does have some pain in regard to each of these ulcerations but he tells me it's not nearly as severe as the hand wound that I help treat earlier over the summer. This was in 2018. 07/15/17 on evaluation today patient's lower surety ulcer is actually both appear to be doing fairly well at this point. He does have a little bit of skin overlapping the ulcer on the right lateral ankle this was debrided away today just with saline and gauze without complication patient had no discomfort or pain. Otherwise patient's left foot ulcer does appear to be showing signs of improvement he does seem to have a right great toe. Nikki and noted at this point in time. I think that this does need to be managed at this point. No fevers, chills, nausea, or vomiting noted at this time. 07/22/17 on evaluation today patient's wounds in regard to his lower extremities appear to be doing somewhat better. His right ankle wound in fact appears to likely be completely closed. The left dorsal foot ulcer though still open does appear to be a little bit smaller and does also seem to be making good progress. He has not been having problems with the treatment at this point in the general he does not seem to show as much erythema surrounding the wound bed which is good news. He is not [name], Dustin E. ([id number]) having significant discomfort. 08/12/17 on evaluation today patients wounds of both appeared to be doing better in fact they both appear to be completely healed. Overall I'm very pleased  with how things have progressed over the past week even more than what I expected. Obviously this is good news  and he is happy about this. He did have a fall when going to his orthopedic doctor recently fortunately he does not appear to have injured anything severely but nonetheless does have a couple of bumps and bruises nothing significant at this time but he needs to be seen and wound care for. He may have broken his left wrist he is following up with orthopedics in that regard. Readmission: 10/24/17 on evaluation today patient presents for reevaluation after having been discharged Aug 12, 2017. His wound was healed at that point although he states that it has been somewhat discolored since that time. He has not experienced the drainage as far as the wound is concerned but the fact that it was still discolored been greater than two months after the fact made him want to come in for reevaluation. Upon inspection today the patient has no pain although he does have neuropathy. He does have a discolored region of the dorsal surface of the left foot which was the site where the ulcer was that we previously treated. The good news is he does not again have any discomfort although obviously I do believe this may be some scar tissue and just discoloration in that regard. Again he hasn't had any drainage or discharge since I last saw him and at this point I see no evidence of that either this area shows complete epithelialization. In general I do not feel like there's any significant issue at this point in that regard. 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 He has been in this clinic before in 2018 with a laceration on his right hand and then again in 2019 with an area over the right lateral malleolus. 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 He states that the wound is been there for the last week or 2. It is been uncomfortable to walk on. The  man is an active man having to look after an elderly mother. Past medical history, type 2 diabetes with polyneuropathy with a recent hemoglobin A1c in September 2011 0.3, lumbar disc disease, hypergonadism., Left foot drop and gout. We did not repeat his ABIs in the clinic quoting values from July 2019 of 0.95 and 1.01. He is not felt to have significant PAD 02/19/2019 on evaluation today patient actually appears to be doing better with regard to his foot wound based on what I am seeing currently. He was seen by Dr. Dellia Nims on readmission back into the clinic last week when I was off. The good news is the patient has been tolerating the dressing changes without any complication in fact has not really had much in the way of drainage. He does have an AFO brace which she believes is actually pushing on this area as well and has contributed to the issue. Fortunately there is no signs of active infection at this point. His ABIs appear to be well. 03/05/2019 on evaluation today patient appears to be doing well in regard to his foot ulcer. He has been tolerating the dressing changes without complication. Fortunately there is no signs of active infection. The wound is measuring somewhat smaller and overall I feel like he is making good progress at this point. 03/12/2019 on evaluation today patient actually  appears to be doing quite well with regard to his foot ulcer. The wound is looking better the measurement is not significantly better at this point unfortunately but nonetheless again the overall appearance is improving I think he is making some progress. I still think the big issue here is foot drop and again the patient wants to see if potentially he could get a brace to help with this. Subsequently I think that we can definitely make a referral to [facility] to see if they can help [place] in this regard. He is in agreement with that plan. 03/26/2019 on evaluation today patient appears to be doing more  poorly in regard to his foot ulcer at this time. He did see podiatry in order to see about getting a brace but unfortunately he tells me that Medicare is not likely to pay for one for him as he had 1 I guess 8 months ago. However this one that apparently goes in his shoes that does not seem to be working out well for him. Unfortunately there are signs of active infection at this time. No fevers, chills, nausea, vomiting, or diarrhea. Systemically. The patient has erythema on the dorsal surface of his foot that tracks from the lateral portion of his foot and again he does have a blister around the wound as well which is worse compared to previous. Overall I am concerned. 04/02/2019 on evaluation today patient appears to be doing about the same with regard to his wound. Unfortunately there is no signs of significant improvement although I do think there is no signs of infection which is good news. His drainage is also [name], Dustin E. ([id number]) slowed down tremendously. I think at this point he would benefit from the initiation of a total contact cast. The only concern I have is that over his balance. I explained to the patient that if we were to initiate the cast I think it would be greatly beneficial for him but he would need to ensure not to walk at all unless he uses a walker he actually has 2 walkers one for inside his home and one that he keeps out in the car. For that reason he would be covered in that regard. I just do not want him to have any accidents and fall with the cast. 12/28-Patient comes in 1 week out a day early for his appointment he was in the hospital for 2 days for uncontrolled hyperglycemia and required management of high blood sugars, with some adjustments in his home regimen of insulin, patient stubbed his right great toe today and put a Band-Aid on it, he is here for his left lateral foot ulcer and we are using silver alginate dressing, he was intact total contact cast  with difficulty with balance to the extent that that had to be discontinued 04/16/2019 on evaluation today patient appears to be doing better with regard to the overall appearance of his wound size. This is good news. We had attempted a total contact cast but unfortunately he had some falling issues that the family contacted [place] about we took the cast off we did not put it back on. Nonetheless it ended up that just a couple days later he was in the hospital due to elevations in his blood sugar which went to around 500 and even a little higher. Subsequently he was admitted to the hospital and was in the hospital for a total of 3 days and that included Christmas day. Fortunately there got his blood  sugar under control he tells me that this is running about 200 now which is much better news. There does not appear to be any signs of active infection at this time. No fevers, chills, nausea, vomiting, or diarrhea. 04/30/2019 on evaluation today patient actually appears to be doing slightly worse compared to previous as far as the overall size of his wound is concerned. There also is some need for sharp debridement today as well. Fortunately there is no evidence of active infection which is good news. No fevers, chills, nausea, vomiting, or diarrhea. Electronic Signature(s) Signed: 04/30/2019 11:01:40 AM By: Worthy Keeler [name] Entered By: Worthy Keeler on 04/30/2019 11:01:40 [name] ([id number]) -------------------------------------------------------------------------------- Physical Exam Details Patient Name: [name] Date of Service: 04/30/2019 10:30 AM Medical Record Number: [id number] Patient Account Number: [id number] Date of Birth/Sex: [birth year] (73 y.o. M) Treating RN: Cornell Barman Primary Care Provider: [name] Other Clinician: Referring Provider: [name] Treating Provider/Extender: [name], Quaran Kedzierski Weeks in Treatment: 83 Constitutional Well-nourished and  well-hydrated in no acute distress. Respiratory normal breathing without difficulty. Psychiatric this patient is able to make decisions and demonstrates good insight into disease process. Alert and Oriented x 3. pleasant and cooperative. Notes Patient's wound bed currently upon inspection showed signs of good granulation to some degree he did have some slough and minimal biofilm noted over the surface of the wound which did require sharp debridement today. Post debridement the wound bed actually appears to be doing significantly better which is great news. Electronic Signature(s) Signed: 04/30/2019 11:02:04 AM By: Worthy Keeler [name] Entered By: Worthy Keeler on 04/30/2019 11:02:03 [name] ([id number]) -------------------------------------------------------------------------------- Physician Orders Details Patient Name: [name] Date of Service: 04/30/2019 10:30 AM Medical Record Number: [id number] Patient Account Number: [id number] Date of Birth/Sex: [birth year] (73 y.o. M) Treating RN: Cornell Barman Primary Care Provider: [name] Other Clinician: Referring Provider: [name] Treating Provider/Extender: Melburn Hake, Trevel Dillenbeck Weeks in Treatment: 11 Verbal / Phone Orders: No Diagnosis Coding ICD-10 Coding Code Description E11.621 Type 2 diabetes mellitus with foot ulcer E11.42 Type 2 diabetes mellitus with diabetic polyneuropathy L97.522 Non-pressure chronic ulcer of other part of left foot with fat layer exposed M21.372 Foot drop, left foot Wound Cleansing Wound #5 Left,Lateral Foot o May shower with protection. Anesthetic (add to Medication List) Wound #5 Left,Lateral Foot o Topical Lidocaine 4% cream applied to wound bed prior to debridement (In Clinic Only). Primary Wound Dressing Wound #5 Left,Lateral Foot o Silver Collagen Secondary Dressing Wound #5 Left,Lateral Foot o Conform/Kerlix - Rolled gauze o Other - Felt to  offload Follow-up Appointments Wound #5 Left,Lateral Foot o Return Appointment in 1 week. Off-Loading o Turn and reposition every 2 hours - Elevate foot when sitting Additional Orders / Instructions Wound #5 Left,Lateral Foot o Other: - Patient to follow-up with [facility] for adjustments to braces and shoes. Electronic Signature(s) Signed: 04/30/2019 4:47:43 PM By: Worthy Keeler [name] Signed: 04/30/2019 5:04:31 PM By: Gretta Cool, BSN, RN, CWS, Kim RN, BSN Entered By: Gretta Cool, BSN, RN, CWS, Kim on 04/30/2019 10:59:32 SAURABH, HETTICH ([id number], FLANNAGAN ([id number]) -------------------------------------------------------------------------------- Problem List Details Patient Name: [name] Date of Service: 04/30/2019 10:30 AM Medical Record Number: [id number] Patient Account Number: [id number] Date of Birth/Sex: 11/12/1945 (73 y.o. M) Treating RN: Cornell Barman Primary Care Provider: [name] Other Clinician: Referring Provider: [name] Treating Provider/Extender: Melburn Hake, Paisely Brick Weeks in Treatment: 11 Active Problems ICD-10 Evaluated Encounter Code Description Active Date Today Diagnosis  C78.938 Type 2 diabetes mellitus with foot ulcer 02/09/2019 No Yes E11.42 Type 2 diabetes mellitus with diabetic polyneuropathy 02/09/2019 No Yes L97.522 Non-pressure chronic ulcer of other part of left foot with fat 02/09/2019 No Yes layer exposed M21.372 Foot drop, left foot 02/09/2019 No Yes Inactive Problems Resolved Problems Electronic Signature(s) Signed: 04/30/2019 10:50:52 AM By: Worthy Keeler [name] Entered By: Worthy Keeler on 04/30/2019 10:50:51 [name] ([id number]) -------------------------------------------------------------------------------- Progress Note Details Patient Name: [name] Date of Service: 04/30/2019 10:30 AM Medical Record Number: [id number] Patient Account Number: [id number] Date of Birth/Sex: 23-Oct-1945 (73 y.o.  M) Treating RN: Cornell Barman Primary Care Provider: [name] Other Clinician: Referring Provider: [name] Treating Provider/Extender: Worthy Keeler Weeks in Treatment: 11 Subjective Chief Complaint Information obtained from Patient Left dorsal foot ulcer 02/09/2019; patient is here for review of the wound on the left lateral foot roughly at the level of the base of the fifth metatarsal History of Present Illness (HPI) 10/04/16 on evaluation today patient presents for initial evaluation concerning a laceration of the right wrist and hand on the bowler aspect. He tells me that he fell into glass following a medication change in regard to his diabetes. He initially went to [facility] where she was sutured and subsequently his daughter who is a Marine scientist removed the teachers at the appropriate time. Unfortunately the wound has done poorly and dehissed which has caused him some trouble including infection. He has been on antibiotics both topically and orally though the wound continues to show signs of necrotic tissue according to notes which is what he was sent to [place] for evaluation concerning. He does have diabetes and unfortunately this is severely uncontrolled. He does see an endocrinologist and is working on this. 10/11/2016 -- the patient's notes from the ER were reviewed and he had his suturing done on 09/17/2016 and had necrotic skin flaps which had to be trimmed the last visit he was here. He has managed to get his Santyl ointment and has been using this appropriately. 10/21/16 patient's wound appears to be doing very well on evaluation today. He still has some Slough covering the wound bed but this is nowhere near as severe as when i saw this initially two weeks ago nor even my review of his pictures last week. I'm pleased with how this is progressing. Patient's wound over the right form appears to be doing fairly well on evaluation today. There is no evidence of infection  and he has a small area at the crease of where she is wrist extends and flexes that is still open and appears to be having difficulty due to the fact that it is cracking open. I think this is something that may be controlled with moisture control that is keeping the wound a little bit more moist. Nonetheless this is a tiny region compared to the initial wound and overall appears to be doing very well. No fevers, chills, nausea, or vomiting noted at this time. 11/25/16 on evaluation today patient appears to be doing well in regard to his right wrist ulcer. In fact this appears to be completely healed he is having no discomfort. He is pleased with how this is progressed. Readmission: 06/24/17 on evaluation today patient appears back in our clinic regarding issues that he has been having with his right ankle on the lateral malleolus as well is in the dorsal surface of his left foot. Both have been going on for several weeks unfortunately. He tells  me that initially this was being managed by Dr. Edilia Bo and I did review the notes from Dr. Edilia Bo in epic. Patient was placed on amoxicillin initially and then subsequently Keflex following. He has been using triple antibiotic ointment over-the-counter along with a Band-Aid for the wound currently. He did not have any Santyl remaining. His most recent hemoglobin A1c which was just two days ago was 10.3. Fortunately he seems to have excellent blood flow in the bilateral lower extremities with his left ABI being 0.95 in the right ABI 1.01. Patient does have some pain in regard to each of these ulcerations but he tells me it's not nearly as severe as the hand wound that I help treat earlier over the summer. This was in 2018. 07/15/17 on evaluation today patient's lower surety ulcer is actually both appear to be doing fairly well at this point. He does have a little bit of skin overlapping the ulcer on the right lateral ankle this was debrided away today just  with saline and gauze without complication patient had no discomfort or pain. Otherwise patient's left foot ulcer does appear to be showing signs of [name], RATHJE. ([id number]) improvement he does seem to have a right great toe. Nikki and noted at this point in time. I think that this does need to be managed at this point. No fevers, chills, nausea, or vomiting noted at this time. 07/22/17 on evaluation today patient's wounds in regard to his lower extremities appear to be doing somewhat better. His right ankle wound in fact appears to likely be completely closed. The left dorsal foot ulcer though still open does appear to be a little bit smaller and does also seem to be making good progress. He has not been having problems with the treatment at this point in the general he does not seem to show as much erythema surrounding the wound bed which is good news. He is not having significant discomfort. 08/12/17 on evaluation today patients wounds of both appeared to be doing better in fact they both appear to be completely healed. Overall I'm very pleased with how things have progressed over the past week even more than what I expected. Obviously this is good news and he is happy about this. He did have a fall when going to his orthopedic doctor recently fortunately he does not appear to have injured anything severely but nonetheless does have a couple of bumps and bruises nothing significant at this time but he needs to be seen and wound care for. He may have broken his left wrist he is following up with orthopedics in that regard. Readmission: 10/24/17 on evaluation today patient presents for reevaluation after having been discharged Aug 12, 2017. His wound was healed at that point although he states that it has been somewhat discolored since that time. He has not experienced the drainage as far as the wound is concerned but the fact that it was still discolored been greater than two months after the  fact made him want to come in for reevaluation. Upon inspection today the patient has no pain although he does have neuropathy. He does have a discolored region of the dorsal surface of the left foot which was the site where the ulcer was that we previously treated. The good news is he does not again have any discomfort although obviously I do believe this may be some scar tissue and just discoloration in that regard. Again he hasn't had any drainage or discharge since I last saw  him and at this point I see no evidence of that either this area shows complete epithelialization. In general I do not feel like there's any significant issue at this point in that regard. 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 He has been in this clinic before in 2018 with a laceration on his right hand and then again in 2019 with an area over the right lateral malleolus. 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 He states that the wound is been there for the last week or 2. It is been uncomfortable to walk on. The man is an active man having to look after an elderly mother. Past medical history, type 2 diabetes with polyneuropathy with a recent hemoglobin A1c in September 2011 0.3, lumbar disc disease, hypergonadism., Left foot drop and gout. We did not repeat his ABIs in the clinic quoting values from July 2019 of 0.95 and 1.01. He is not felt to have significant PAD 02/19/2019 on evaluation today patient actually appears to be doing better with regard to his foot wound based on what I am seeing currently. He was seen by Dr. Dellia Nims on readmission back into the clinic last week when I was off. The good news is the patient has been tolerating the dressing changes without any complication in fact has not really had much in the way of drainage. He does  have an AFO brace which she believes is actually pushing on this area as well and has contributed to the issue. Fortunately there is no signs of active infection at this point. His ABIs appear to be well. 03/05/2019 on evaluation today patient appears to be doing well in regard to his foot ulcer. He has been tolerating the dressing changes without complication. Fortunately there is no signs of active infection. The wound is measuring somewhat smaller and overall I feel like he is making good progress at this point. 03/12/2019 on evaluation today patient actually appears to be doing quite well with regard to his foot ulcer. The wound is looking better the measurement is not significantly better at this point unfortunately but nonetheless again the overall appearance is improving I think he is making some progress. I still think the big issue here is foot drop and again the patient wants to see if potentially he could get a brace to help with this. Subsequently I think that we can definitely make a referral to [facility] to see if they can help [place] in this regard. He is in agreement with that plan. 03/26/2019 on evaluation today patient appears to be doing more poorly in regard to his foot ulcer at this time. He did see podiatry in order to see about getting a brace but unfortunately he tells me that Medicare is not likely to pay for one for him as [name], KERRIGAN. ([id number]) he had 1 I guess 8 months ago. However this one that apparently goes in his shoes that does not seem to be working out well for him. Unfortunately there are signs of active infection at this time. No fevers, chills, nausea, vomiting, or diarrhea. Systemically. The patient has erythema on the dorsal surface of his foot that tracks from the lateral portion of his foot and again he does have a blister around the wound as well which is worse compared to previous. Overall I am concerned.  04/02/2019 on evaluation today patient appears  to be doing about the same with regard to his wound. Unfortunately there is no signs of significant improvement although I do think there is no signs of infection which is good news. His drainage is also slowed down tremendously. I think at this point he would benefit from the initiation of a total contact cast. The only concern I have is that over his balance. I explained to the patient that if we were to initiate the cast I think it would be greatly beneficial for him but he would need to ensure not to walk at all unless he uses a walker he actually has 2 walkers one for inside his home and one that he keeps out in the car. For that reason he would be covered in that regard. I just do not want him to have any accidents and fall with the cast. 12/28-Patient comes in 1 week out a day early for his appointment he was in the hospital for 2 days for uncontrolled hyperglycemia and required management of high blood sugars, with some adjustments in his home regimen of insulin, patient stubbed his right great toe today and put a Band-Aid on it, he is here for his left lateral foot ulcer and we are using silver alginate dressing, he was intact total contact cast with difficulty with balance to the extent that that had to be discontinued 04/16/2019 on evaluation today patient appears to be doing better with regard to the overall appearance of his wound size. This is good news. We had attempted a total contact cast but unfortunately he had some falling issues that the family contacted [place] about we took the cast off we did not put it back on. Nonetheless it ended up that just a couple days later he was in the hospital due to elevations in his blood sugar which went to around 500 and even a little higher. Subsequently he was admitted to the hospital and was in the hospital for a total of 3 days and that included Christmas day. Fortunately there got his blood sugar under control he tells me that this is running  about 200 now which is much better news. There does not appear to be any signs of active infection at this time. No fevers, chills, nausea, vomiting, or diarrhea. 04/30/2019 on evaluation today patient actually appears to be doing slightly worse compared to previous as far as the overall size of his wound is concerned. There also is some need for sharp debridement today as well. Fortunately there is no evidence of active infection which is good news. No fevers, chills, nausea, vomiting, or diarrhea. Objective Constitutional Well-nourished and well-hydrated in no acute distress. Vitals Time Taken: 10:30 AM, Height: 71 in, Weight: 205 lbs, BMI: 28.6, Temperature: 97.2 F, Pulse: 106 bpm, Respiratory Rate: 16 breaths/min, Blood Pressure: 177/80 mmHg. Respiratory normal breathing without difficulty. Psychiatric this patient is able to make decisions and demonstrates good insight into disease process. Alert and Oriented x 3. pleasant and cooperative. General Notes: Patient's wound bed currently upon inspection showed signs of good granulation to some degree he did have some slough and minimal biofilm noted over the surface of the wound which did require sharp debridement today. Post debridement the wound bed actually appears to be doing significantly better which is great news. [name], [name] ([id number]) Integumentary (Hair, Skin) Wound #5 status is Open. Original cause of wound was Pressure Injury. The wound is located on the Left,Lateral Foot. The wound  measures 0.6cm length x 0.6cm width x 0.1cm depth; 0.283cm^2 area and 0.028cm^3 volume. There is Fat Layer (Subcutaneous Tissue) Exposed exposed. There is no tunneling or undermining noted. There is a medium amount of serosanguineous drainage noted. The wound margin is flat and intact. There is medium (34-66%) pink granulation within the wound bed. There is a medium (34-66%) amount of necrotic tissue within the wound bed including Adherent  Slough. 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 There was a Excisional Skin/Subcutaneous Tissue Debridement with a total area of 0.36 sq cm performed by [name], Danial Sisley E., [name]. With the following instrument(s): Curette to remove Viable and Non-Viable tissue/material. Material removed includes Callus, Subcutaneous Tissue, and Slough after achieving pain control using Lidocaine. No specimens were taken. A time out was conducted at 10:52, prior to the start of the procedure. A Minimum amount of bleeding was controlled with Pressure. The procedure was tolerated well. Post Debridement Measurements: 0.6cm length x 0.6cm width x 0.2cm depth; 0.057cm^3 volume. Character of Wound/Ulcer Post Debridement is stable. Severity of Tissue Post Debridement is: Fat layer exposed. Post procedure Diagnosis Wound #5: Same as Pre-Procedure Plan Wound Cleansing: Wound #5 Left,Lateral Foot: May shower with protection. Anesthetic (add to Medication List): Wound #5 Left,Lateral Foot: Topical Lidocaine 4% cream applied to wound bed prior to debridement (In Clinic Only). Primary Wound Dressing: Wound #5 Left,Lateral Foot: Silver Collagen Secondary Dressing: Wound #5 Left,Lateral Foot: Conform/Kerlix - Rolled gauze Other - Felt to offload [name], [name]. ([id number]) Follow-up Appointments: Wound #5 Left,Lateral Foot: Return Appointment in 1 week. Off-Loading: Turn and reposition every 2 hours - Elevate foot when sitting Additional Orders / Instructions: Wound #5 Left,Lateral Foot: Other: - Patient to follow-up with [facility]  for adjustments to braces and shoes. 1. My suggestion at this time based on what I am seeing is good to be that we going to continue with the silver collagen dressing I still think this is appropriate. 2. We are going to add some felt as a offloading measure over top of this to try to help out at this point. With that being said I really think that the patient needs more aggressive offloading but unfortunately with his foot drop is not able to use any type of peg assist or offloading shoe we also attempted a total contact cast which I think would have been perfect but unfortunately he had significant falling. Obviously we do not want anything bad to occur as a result of that. For that reason we are somewhat limited at this point as far as what we can do. 3. I would recommend that he go back to [facility] to discuss his dropfoot brace to see if there is anything they can do to help out with this since it actually seems to be a major issue here with regard to the dropfoot as far as rubbing and irritation is concerned with the wound. I had sent the patient to [facility] but unfortunately since he is recently gotten the brace already for dropfoot they could not do anything for him his insurance will not pay for any type of different brace at this point. I am unsure  whether or not [facility] will be able to do something different since it seems to be that the brace itself did cause some of the wound and irritation as well. We will see patient back for reevaluation in 1 week here in the clinic. If anything worsens or changes patient will contact our office for additional recommendations. Electronic Signature(s) Signed: 04/30/2019 11:03:58 AM By: Worthy Keeler [name] Entered By: Worthy Keeler on 04/30/2019 11:03:58 [name] ([id number]) -------------------------------------------------------------------------------- SuperBill Details Patient Name: [name] Date of Service:  04/30/2019 Medical Record Number: [id number] Patient Account Number: [id number] Date of Birth/Sex: [birth year] (73 y.o. M) Treating RN: Cornell Barman Primary Care Provider: [name] Other Clinician: Referring Provider: [name] Treating Provider/Extender: Melburn Hake, [name] [name] Weeks in Treatment: 11 Diagnosis Coding ICD-10 Codes Code Description E11.621 Type 2 diabetes mellitus with foot ulcer E11.42 Type 2 diabetes mellitus with diabetic polyneuropathy L97.522 Non-pressure chronic ulcer of other part of left foot with fat layer exposed M21.372 Foot drop, left foot Facility Procedures CPT4 Code: [id number] Description: 93716 - DEB SUBQ TISSUE 20 SQ CM/< ICD-10 Diagnosis Description L97.522 Non-pressure chronic ulcer of other part of left foot with fat Modifier: layer exposed Quantity: 1 Physician Procedures CPT4 Code: 9678938 Description: 11042 - WC PHYS SUBQ TISS 20 SQ CM ICD-10 Diagnosis Description L97.522 Non-pressure chronic ulcer of other part of left foot with fat Modifier: layer exposed Quantity: 1 Electronic Signature(s) Signed: 04/30/2019 11:04:09 AM By: Worthy Keeler [name] Entered By: Worthy Keeler on 04/30/2019 11:04:09

## 2019-04-30 NOTE — Progress Notes (Signed)
JOVONI, BORKENHAGEN ([id number]) Visit Report for 04/30/2019 Arrival Information Details Patient Name: KOEHN, SALEHI Date of Service: 04/30/2019 10:30 AM Medical Record Number: [id number] Patient Account Number: [id number] Date of Birth/Sex: 04/07/1946 (73 y.o. M) Treating RN: Cornell Barman Primary Care Tavious Griesinger: [name] Other Clinician: Referring Vrinda Heckstall: [name] Treating Quisha Mabie/Extender: Melburn Hake, HOYT Weeks in Treatment: 11 Visit Information History Since Last Visit Added or deleted any medications: No Patient Arrived: Cane Any new allergies or adverse reactions: No Arrival Time: 10:28 Had a fall or experienced change in No Accompanied By: self activities of daily living that may affect Transfer Assistance: None risk of falls: Secondary Verification Process Completed: Yes Signs or symptoms of abuse/neglect since last visito No Patient Has Alerts: Yes Hospitalized since last visit: No Patient Alerts: DMII Implantable device outside of the clinic excluding No cellular tissue based products placed in the center since last visit: Pain Present Now: No Electronic Signature(s) Signed: 04/30/2019 11:15:10 AM By: Lorine Bears RCP, RRT, CHT Entered By: Lorine Bears on 04/30/2019 10:30:40 [name] ([id number]) -------------------------------------------------------------------------------- Encounter Discharge Information Details Patient Name: [name] Date of Service: 04/30/2019 10:30 AM Medical Record Number: [id number] Patient Account Number: [id number] Date of Birth/Sex: Jul 25, 1945 (73 y.o. M) Treating RN: Cornell Barman Primary Care Adilene Areola: [name] Other Clinician: Referring Jaye Saal: [name] Treating Rachelann Enloe/Extender: Melburn Hake, HOYT Weeks in Treatment: 11 Encounter Discharge Information Items Post Procedure Vitals Discharge Condition: Stable Temperature (F): 97.2 Ambulatory Status:  Ambulatory Pulse (bpm): 106 Discharge Destination: Home Respiratory Rate (breaths/min): 16 Transportation: Private Auto Blood Pressure (mmHg): 177/80 Accompanied By: self Schedule Follow-up Appointment: Yes Clinical Summary of Care: Electronic Signature(s) Signed: 04/30/2019 5:04:31 PM By: Gretta Cool, BSN, RN, CWS, Kim RN, BSN Entered By: Gretta Cool, BSN, RN, CWS, Kim on 04/30/2019 11:02:00 [name] ([id number]) -------------------------------------------------------------------------------- Lower Extremity Assessment Details Patient Name: [name] Date of Service: 04/30/2019 10:30 AM Medical Record Number: [id number] Patient Account Number: [id number] Date of Birth/Sex: 1945-04-17 (73 y.o. M) Treating RN: Montey Hora Primary Care Briggitte Boline: [name] Other Clinician: Referring Derric Dealmeida: [name] Treating Chandell Attridge/Extender: [name], HOYT Weeks in Treatment: 11 Edema Assessment Assessed: [Left: No] [Right: No] Edema: [Left: N] [Right: o] Vascular Assessment Pulses: Dorsalis Pedis Palpable: [Left:Yes] Electronic Signature(s) Signed: 04/30/2019 4:42:47 PM By: Montey Hora Entered By: Montey Hora on 04/30/2019 10:33:41 [name] ([id number]) -------------------------------------------------------------------------------- Multi Wound Chart Details Patient Name: [name] Date of Service: 04/30/2019 10:30 AM Medical Record Number: [id number] Patient Account Number: [id number] Date of Birth/Sex: Jul 31, 1945 (73 y.o. M) Treating RN: Cornell Barman Primary Care Cianna Kasparian: [name] Other Clinician: Referring Jamarques Pinedo: [name] Treating Jakki Doughty/Extender: Melburn Hake, HOYT Weeks in Treatment: 11 Vital Signs Height(in): 71 Pulse(bpm): 106 Weight(lbs): 205 Blood Pressure(mmHg): 177/80 Body Mass Index(BMI): 29 Temperature(F): 97.2 Respiratory Rate 16 (breaths/min): Photos: [N/A:N/A] Wound Location: Left Foot - Lateral N/A  N/A Wounding Event: Pressure Injury N/A N/A Primary Etiology: Diabetic Wound/Ulcer of the N/A N/A Lower Extremity Comorbid History: Cataracts, Middle ear N/A N/A problems, Sleep Apnea, Hypertension, Type II Diabetes, Gout, Osteoarthritis, Neuropathy Date Acquired: 01/19/2019 N/A N/A Weeks of Treatment: 11 N/A N/A Wound Status: Open N/A N/A Measurements L x W x D 0.6x0.6x0.1 N/A N/A (cm) Area (cm) : 0.283 N/A N/A Volume (cm) : 0.028 N/A N/A % Reduction in Area: -2.90% N/A N/A % Reduction in Volume: 65.90% N/A N/A Classification: Grade 2 N/A N/A Exudate Amount: Medium N/A N/A Exudate Type: Serosanguineous N/A N/A Exudate Color: red, brown N/A N/A Wound  Margin: Flat and Intact N/A N/A Granulation Amount: Medium (34-66%) N/A N/A Granulation Quality: Pink N/A N/A Necrotic Amount: Medium (34-66%) N/A N/A Exposed Structures: Fat Layer (Subcutaneous N/A N/A Tissue) Exposed: Yes Fascia: No JAQUAWN, SAFFRAN ([id number]) Tendon: No Muscle: No Joint: No Bone: No Epithelialization: None N/A N/A Treatment Notes Electronic Signature(s) Signed: 04/30/2019 5:04:31 PM By: Gretta Cool, BSN, RN, CWS, Kim RN, BSN Entered By: Gretta Cool, BSN, RN, CWS, Kim on 04/30/2019 10:55:32 [name] ([id number]) -------------------------------------------------------------------------------- [place] Details Patient Name: [name] Date of Service: 04/30/2019 10:30 AM Medical Record Number: [id number] Patient Account Number: [id number] Date of Birth/Sex: 02-Sep-1945 (73 y.o. M) Treating RN: Cornell Barman Primary Care Karthik Whittinghill: [name] Other Clinician: Referring Jeaneen Cala: [name] Treating Johnsie Moscoso/Extender: Melburn Hake, HOYT Weeks in Treatment: 11 Active Inactive Pressure Nursing Diagnoses: Knowledge deficit related to management of pressures ulcers Goals: Patient will remain free from development of additional pressure ulcers Date Initiated: 04/30/2019 Target  Resolution Date: 05/14/2019 Goal Status: Active Patient/caregiver will verbalize understanding of pressure ulcer management Date Initiated: 04/30/2019 Target Resolution Date: 05/14/2019 Goal Status: Active Interventions: Assess: immobility, friction, shearing, incontinence upon admission and as needed Treatment Activities: Patient referred for pressure reduction/relief devices : 04/30/2019 Notes: Wound/Skin Impairment Nursing Diagnoses: Impaired tissue integrity Goals: Ulcer/skin breakdown will have a volume reduction of 30% by week 4 Date Initiated: 04/30/2019 Target Resolution Date: 05/31/2019 Goal Status: Active Interventions: Assess patient/caregiver ability to obtain necessary supplies Assess ulceration(s) every visit Treatment Activities: Referred to DME Darrold Bezek for dressing supplies : 04/30/2019 Skin care regimen initiated : 04/30/2019 Topical wound management initiated : 04/30/2019 Notes: GUSSIE, TOWSON ([id number]) Electronic Signature(s) Signed: 04/30/2019 5:04:31 PM By: Gretta Cool, BSN, RN, CWS, Kim RN, BSN Entered By: Gretta Cool, BSN, RN, CWS, Kim on 04/30/2019 10:55:24 [name] ([id number]) -------------------------------------------------------------------------------- Pain Assessment Details Patient Name: [name] Date of Service: 04/30/2019 10:30 AM Medical Record Number: [id number] Patient Account Number: [id number] Date of Birth/Sex: 1945/08/21 (73 y.o. M) Treating RN: Cornell Barman Primary Care Jade Burkard: [name] Other Clinician: Referring Barbar Brede: [name] Treating Shiela Bruns/Extender: Melburn Hake, HOYT Weeks in Treatment: 11 Active Problems Location of Pain Severity and Description of Pain Patient Has Paino No Site Locations Pain Management and Medication Current Pain Management: Electronic Signature(s) Signed: 04/30/2019 11:15:10 AM By: Lorine Bears RCP, RRT, CHT Signed: 04/30/2019 5:04:31 PM By: Gretta Cool, BSN, RN, CWS, Kim RN,  BSN Entered By: Lorine Bears on 04/30/2019 10:30:57 [name] ([id number]) -------------------------------------------------------------------------------- Patient/Caregiver Education Details Patient Name: [name] Date of Service: 04/30/2019 10:30 AM Medical Record Number: [id number] Patient Account Number: [id number] Date of Birth/Gender: 08-23-1945 (73 y.o. M) Treating RN: Cornell Barman Primary Care Physician: [name] Other Clinician: Referring Physician: [name] Treating Physician/Extender: Sharalyn Ink in Treatment: 11 Education Assessment Education Provided To: Patient Education Topics Provided Wound/Skin Impairment: Handouts: Caring for Your Ulcer, Other: continue as prescribed Methods: Demonstration, Explain/Verbal Responses: State content correctly Electronic Signature(s) Signed: 04/30/2019 5:04:31 PM By: Gretta Cool, BSN, RN, CWS, Kim RN, BSN Entered By: Gretta Cool, BSN, RN, CWS, Kim on 04/30/2019 11:00:04 [name] ([id number]) -------------------------------------------------------------------------------- Wound Assessment Details Patient Name: [name] Date of Service: 04/30/2019 10:30 AM Medical Record Number: [id number] Patient Account Number: [id number] Date of Birth/Sex: 1946/01/31 (73 y.o. M) Treating RN: Montey Hora Primary Care Lorijean Husser: [name] Other Clinician: Referring Rayquon Uselman: [name] Treating Kira Hartl/Extender: [name], HOYT Weeks in Treatment: 11 Wound Status Wound Number: 5 Primary Diabetic Wound/Ulcer of the Lower Extremity Etiology:  Wound Location: Left Foot - Lateral Wound Open Wounding Event: Pressure Injury Status: Date Acquired: 01/19/2019 Comorbid Cataracts, Middle ear problems, Sleep Apnea, Weeks Of Treatment: 11 History: Hypertension, Type II Diabetes, Gout, Clustered Wound: No Osteoarthritis, Neuropathy Photos Wound Measurements Length: (cm) 0.6 %  Reduction in Width: (cm) 0.6 % Reduction in Depth: (cm) 0.1 Epithelializat Area: (cm) 0.283 Tunneling: Volume: (cm) 0.028 Undermining: Area: -2.9% Volume: 65.9% ion: None No No Wound Description Classification: Grade 2 Foul Odor Aft Wound Margin: Flat and Intact Slough/Fibrin Exudate Amount: Medium Exudate Type: Serosanguineous Exudate Color: red, brown er Cleansing: No o Yes Wound Bed Granulation Amount: Medium (34-66%) Exposed Structure Granulation Quality: Pink Fascia Exposed: No Necrotic Amount: Medium (34-66%) Fat Layer (Subcutaneous Tissue) Exposed: Yes Necrotic Quality: Adherent Slough Tendon Exposed: No Muscle Exposed: No Joint Exposed: No Bone Exposed: No Treatment Notes DANILE, TRIER ([id number]) Wound #5 (Left, Lateral Foot) Notes Silver collagen, Felt for offloading, conform Electronic Signature(s) Signed: 04/30/2019 4:42:47 PM By: Montey Hora Entered By: Montey Hora on 04/30/2019 10:37:03 [name] ([id number]) -------------------------------------------------------------------------------- [place] Details Patient Name: [name] Date of Service: 04/30/2019 10:30 AM Medical Record Number: [id number] Patient Account Number: [id number] Date of Birth/Sex: Nov 22, 1945 (73 y.o. M) Treating RN: Cornell Barman Primary Care Gertha Lichtenberg: [name] Other Clinician: Referring Angelene Rome: [name] Treating Theodosia Bahena/Extender: Melburn Hake, HOYT Weeks in Treatment: 11 Vital Signs Time Taken: 10:30 Temperature (F): 97.2 Height (in): 71 Pulse (bpm): 106 Weight (lbs): 205 Respiratory Rate (breaths/min): 16 Body Mass Index (BMI): 28.6 Blood Pressure (mmHg): 177/80 Reference Range: 80 - 120 mg / dl Electronic Signature(s) Signed: 04/30/2019 11:15:10 AM By: Lorine Bears RCP, RRT, CHT Entered By: Becky Sax, Amado Nash on 04/30/2019 10:32:32

## 2019-05-02 ENCOUNTER — Other Ambulatory Visit: Payer: Self-pay

## 2019-05-02 ENCOUNTER — Ambulatory Visit (INDEPENDENT_AMBULATORY_CARE_PROVIDER_SITE_OTHER): Payer: Medicare Other

## 2019-05-02 DIAGNOSIS — E291 Testicular hypofunction: Secondary | ICD-10-CM | POA: Diagnosis not present

## 2019-05-02 MED ORDER — TESTOSTERONE CYPIONATE 200 MG/ML IM SOLN
150.0000 mg | Freq: Once | INTRAMUSCULAR | Status: AC
  Administered 2019-05-02: 150 mg via INTRAMUSCULAR

## 2019-05-02 NOTE — Progress Notes (Signed)
Per orders of Dr. Frederico Hamman Copland, injection of 150mg  Testosterone given by Lurlean Nanny.  Patient tolerated injection well.

## 2019-05-04 DIAGNOSIS — M21372 Foot drop, left foot: Secondary | ICD-10-CM | POA: Diagnosis not present

## 2019-05-07 ENCOUNTER — Other Ambulatory Visit: Payer: Self-pay

## 2019-05-07 ENCOUNTER — Encounter: Payer: Medicare Other | Admitting: Physician Assistant

## 2019-05-07 DIAGNOSIS — I10 Essential (primary) hypertension: Secondary | ICD-10-CM | POA: Diagnosis not present

## 2019-05-07 DIAGNOSIS — I252 Old myocardial infarction: Secondary | ICD-10-CM | POA: Diagnosis not present

## 2019-05-07 DIAGNOSIS — Z87891 Personal history of nicotine dependence: Secondary | ICD-10-CM | POA: Diagnosis not present

## 2019-05-07 DIAGNOSIS — L89899 Pressure ulcer of other site, unspecified stage: Secondary | ICD-10-CM | POA: Diagnosis not present

## 2019-05-07 DIAGNOSIS — Z809 Family history of malignant neoplasm, unspecified: Secondary | ICD-10-CM | POA: Diagnosis not present

## 2019-05-07 DIAGNOSIS — E1142 Type 2 diabetes mellitus with diabetic polyneuropathy: Secondary | ICD-10-CM | POA: Diagnosis not present

## 2019-05-07 DIAGNOSIS — L97522 Non-pressure chronic ulcer of other part of left foot with fat layer exposed: Secondary | ICD-10-CM | POA: Diagnosis not present

## 2019-05-07 DIAGNOSIS — M1990 Unspecified osteoarthritis, unspecified site: Secondary | ICD-10-CM | POA: Diagnosis not present

## 2019-05-07 DIAGNOSIS — M21372 Foot drop, left foot: Secondary | ICD-10-CM | POA: Diagnosis not present

## 2019-05-07 DIAGNOSIS — E1151 Type 2 diabetes mellitus with diabetic peripheral angiopathy without gangrene: Secondary | ICD-10-CM | POA: Diagnosis not present

## 2019-05-07 DIAGNOSIS — M109 Gout, unspecified: Secondary | ICD-10-CM | POA: Diagnosis not present

## 2019-05-07 DIAGNOSIS — Z794 Long term (current) use of insulin: Secondary | ICD-10-CM | POA: Diagnosis not present

## 2019-05-07 DIAGNOSIS — Z8249 Family history of ischemic heart disease and other diseases of the circulatory system: Secondary | ICD-10-CM | POA: Diagnosis not present

## 2019-05-07 DIAGNOSIS — Z833 Family history of diabetes mellitus: Secondary | ICD-10-CM | POA: Diagnosis not present

## 2019-05-07 DIAGNOSIS — Z823 Family history of stroke: Secondary | ICD-10-CM | POA: Diagnosis not present

## 2019-05-07 DIAGNOSIS — E11621 Type 2 diabetes mellitus with foot ulcer: Secondary | ICD-10-CM | POA: Diagnosis not present

## 2019-05-07 NOTE — Progress Notes (Signed)
[name], [name] ([id number]) Visit Report for 05/07/2019 Arrival Information Details Patient Name: [name] Date of Service: 05/07/2019 10:30 AM Medical Record Number: [id number] Patient Account Number: [id number] Date of Birth/Sex: 1946/04/12 (74 y.o. M) Treating RN: Cornell Barman Primary Care Annasophia Crocker: [name] Other Clinician: Referring Salene Mohamud: [name] Treating Snyder Colavito/Extender: Melburn Hake, HOYT Weeks in Treatment: 12 Visit Information History Since Last Visit Added or deleted any medications: No Patient Arrived: Cane Any new allergies or adverse reactions: No Arrival Time: 10:41 Had a fall or experienced change in No Accompanied By: self activities of daily living that may affect Transfer Assistance: None risk of falls: Patient Identification Verified: Yes Signs or symptoms of abuse/neglect since last visito No Secondary Verification Process Completed: Yes Hospitalized since last visit: No Patient Has Alerts: Yes Implantable device outside of the clinic excluding No Patient Alerts: DMII cellular tissue based products placed in the center since last visit: Has Dressing in Place as Prescribed: Yes Pain Present Now: No Electronic Signature(s) Signed: 05/07/2019 4:35:47 PM By: Gretta Cool, BSN, RN, CWS, Kim RN, BSN Entered By: Gretta Cool, BSN, RN, CWS, Kim on 05/07/2019 10:41:52 [name] ([id number]) -------------------------------------------------------------------------------- Clinic Level of Care Assessment Details Patient Name: [name] Date of Service: 05/07/2019 10:30 AM Medical Record Number: [id number] Patient Account Number: [id number] Date of Birth/Sex: 1945-06-04 (74 y.o. M) Treating RN: Cornell Barman Primary Care Noor Vidales: [name] Other Clinician: Referring Nizhoni Parlow: [name] Treating Navy Belay/Extender: Melburn Hake, HOYT Weeks in Treatment: 12 Clinic Level of Care Assessment Items TOOL 4 Quantity Score []  - Use when  only an EandM is performed on FOLLOW-UP visit 0 ASSESSMENTS - Nursing Assessment / Reassessment X - Reassessment of Co-morbidities (includes updates in patient status) 1 10 X- 1 5 Reassessment of Adherence to Treatment Plan ASSESSMENTS - Wound and Skin Assessment / Reassessment X - Simple Wound Assessment / Reassessment - one wound 1 5 []  - 0 Complex Wound Assessment / Reassessment - multiple wounds []  - 0 Dermatologic / Skin Assessment (not related to wound area) ASSESSMENTS - Focused Assessment []  - Circumferential Edema Measurements - multi extremities 0 []  - 0 Nutritional Assessment / Counseling / Intervention []  - 0 Lower Extremity Assessment (monofilament, tuning fork, pulses) []  - 0 Peripheral Arterial Disease Assessment (using hand held doppler) ASSESSMENTS - Ostomy and/or Continence Assessment and Care []  - Incontinence Assessment and Management 0 []  - 0 Ostomy Care Assessment and Management (repouching, etc.) PROCESS - Coordination of Care X - Simple Patient / Family Education for ongoing care 1 15 []  - 0 Complex (extensive) Patient / Family Education for ongoing care []  - 0 Staff obtains Programmer, systems, Records, Test Results / Process Orders []  - 0 Staff telephones HHA, Nursing Homes / Clarify orders / etc []  - 0 Routine Transfer to another Facility (non-emergent condition) []  - 0 Routine Hospital Admission (non-emergent condition) []  - 0 New Admissions / Biomedical engineer / Ordering NPWT, Apligraf, etc. []  - 0 Emergency Hospital Admission (emergent condition) X- 1 10 Simple Discharge Coordination [name], [name] ([id number]) []  - 0 Complex (extensive) Discharge Coordination PROCESS - Special Needs []  - Pediatric / Minor Patient Management 0 []  - 0 Isolation Patient Management []  - 0 Hearing / Language / Visual special needs []  - 0 Assessment of Community assistance (transportation, D/C planning, etc.) []  - 0 Additional assistance / Altered  mentation []  - 0 Support Surface(s) Assessment (bed, cushion, seat, etc.) INTERVENTIONS - Wound Cleansing / Measurement X - Simple Wound Cleansing - one wound 1  5 []  - 0 Complex Wound Cleansing - multiple wounds X- 1 5 Wound Imaging (photographs - any number of wounds) []  - 0 Wound Tracing (instead of photographs) X- 1 5 Simple Wound Measurement - one wound []  - 0 Complex Wound Measurement - multiple wounds INTERVENTIONS - Wound Dressings []  - Small Wound Dressing one or multiple wounds 0 X- 1 15 Medium Wound Dressing one or multiple wounds []  - 0 Large Wound Dressing one or multiple wounds []  - 0 Application of Medications - topical []  - 0 Application of Medications - injection INTERVENTIONS - Miscellaneous []  - External ear exam 0 []  - 0 Specimen Collection (cultures, biopsies, blood, body fluids, etc.) []  - 0 Specimen(s) / Culture(s) sent or taken to Lab for analysis []  - 0 Patient Transfer (multiple staff / Civil Service fast streamer / Similar devices) []  - 0 Simple Staple / Suture removal (25 or less) []  - 0 Complex Staple / Suture removal (26 or more) []  - 0 Hypo / Hyperglycemic Management (close monitor of Blood Glucose) []  - 0 Ankle / Brachial Index (ABI) - do not check if billed separately X- 1 5 Vital Signs [name], [name]. ([id number]) Has the patient been seen at the hospital within the last three years: Yes Total Score: 80 Level Of Care: New/Established - Level 3 Electronic Signature(s) Signed: 05/07/2019 4:35:47 PM By: Gretta Cool, BSN, RN, CWS, Kim RN, BSN Entered By: Gretta Cool, BSN, RN, CWS, Kim on 05/07/2019 11:19:44 [name] ([id number]) -------------------------------------------------------------------------------- Encounter Discharge Information Details Patient Name: [name] Date of Service: 05/07/2019 10:30 AM Medical Record Number: [id number] Patient Account Number: [id number] Date of Birth/Sex: 1945/10/22 (74 y.o. M) Treating RN: Cornell Barman Primary Care Simmone Cape: [name] Other Clinician: Referring Ailah Barna: [name] Treating Madiha Bambrick/Extender: Melburn Hake, HOYT Weeks in Treatment: 12 Encounter Discharge Information Items Discharge Condition: Stable Ambulatory Status: Ambulatory Discharge Destination: Home Transportation: Private Auto Accompanied By: self Schedule Follow-up Appointment: Yes Clinical Summary of Care: Electronic Signature(s) Signed: 05/07/2019 4:35:47 PM By: Gretta Cool, BSN, RN, CWS, Kim RN, BSN Entered By: Gretta Cool, BSN, RN, CWS, Kim on 05/07/2019 11:20:28 [name] ([id number]) -------------------------------------------------------------------------------- Lower Extremity Assessment Details Patient Name: [name] Date of Service: 05/07/2019 10:30 AM Medical Record Number: [id number] Patient Account Number: [id number] Date of Birth/Sex: Jan 30, 1946 (74 y.o. M) Treating RN: Cornell Barman Primary Care Deen Deguia: [name] Other Clinician: Referring Nicoles Sedlacek: [name] Treating Shivan Hodes/Extender: Melburn Hake, HOYT Weeks in Treatment: 12 Edema Assessment Assessed: [Left: No] [Right: No] Edema: [Left: N] [Right: o] Vascular Assessment Pulses: Dorsalis Pedis Palpable: [Left:Yes] Electronic Signature(s) Signed: 05/07/2019 4:35:47 PM By: Gretta Cool, BSN, RN, CWS, Kim RN, BSN Entered By: Gretta Cool, BSN, RN, CWS, Kim on 05/07/2019 10:48:17 [name] ([id number]) -------------------------------------------------------------------------------- Multi Wound Chart Details Patient Name: [name] Date of Service: 05/07/2019 10:30 AM Medical Record Number: [id number] Patient Account Number: [id number] Date of Birth/Sex: 1945-09-26 (74 y.o. M) Treating RN: Cornell Barman Primary Care Lawanda Holzheimer: [name] Other Clinician: Referring Kiely Cousar: [name] Treating Lindi Abram/Extender: Melburn Hake, HOYT Weeks in Treatment: 12 Vital Signs Height(in): 71 Pulse(bpm):  31 Weight(lbs): 205 Blood Pressure(mmHg): 145/85 Body Mass Index(BMI): 29 Temperature(F): 97.7 Respiratory Rate 16 (breaths/min): Photos: [N/A:N/A] Wound Location: Left Foot - Lateral N/A N/A Wounding Event: Pressure Injury N/A N/A Primary Etiology: Diabetic Wound/Ulcer of the N/A N/A Lower Extremity Comorbid History: Cataracts, Middle ear N/A N/A problems, Sleep Apnea, Hypertension, Type II Diabetes, Gout, Osteoarthritis, Neuropathy Date Acquired: 01/19/2019 N/A N/A Weeks of Treatment: 12 N/A N/A Wound Status: Open  N/A N/A Measurements L x W x D 0.4x0.4x0.1 N/A N/A (cm) Area (cm) : 0.126 N/A N/A Volume (cm) : 0.013 N/A N/A % Reduction in Area: 54.20% N/A N/A % Reduction in Volume: 84.10% N/A N/A Starting Position 1 11 (o'clock): Ending Position 1 2 (o'clock): Maximum Distance 1 (cm): 0.1 Undermining: Yes N/A N/A Classification: Grade 2 N/A N/A Exudate Amount: Medium N/A N/A Exudate Type: Serosanguineous N/A N/A Exudate Color: red, brown N/A N/A Wound Margin: Flat and Intact N/A N/A [name], [name] ([id number]) Granulation Amount: Large (67-100%) N/A N/A Granulation Quality: Pink N/A N/A Necrotic Amount: None Present (0%) N/A N/A Exposed Structures: Fat Layer (Subcutaneous N/A N/A Tissue) Exposed: Yes Fascia: No Tendon: No Muscle: No Joint: No Bone: No Epithelialization: None N/A N/A Treatment Notes Electronic Signature(s) Signed: 05/07/2019 4:35:47 PM By: Gretta Cool, BSN, RN, CWS, Kim RN, BSN Entered By: Gretta Cool, BSN, RN, CWS, Kim on 05/07/2019 11:11:40 [name] ([id number]) -------------------------------------------------------------------------------- Multi-Disciplinary Care Plan Details Patient Name: [name] Date of Service: 05/07/2019 10:30 AM Medical Record Number: [id number] Patient Account Number: [id number] Date of Birth/Sex: 08/27/1945 (74 y.o. M) Treating RN: Cornell Barman Primary Care Karryn Kosinski: [name] Other  Clinician: Referring Windsor Zirkelbach: [name] Treating Osborn Pullin/Extender: Melburn Hake, HOYT Weeks in Treatment: 12 Active Inactive Pressure Nursing Diagnoses: Knowledge deficit related to management of pressures ulcers Goals: Patient will remain free from development of additional pressure ulcers Date Initiated: 04/30/2019 Target Resolution Date: 05/14/2019 Goal Status: Active Patient/caregiver will verbalize understanding of pressure ulcer management Date Initiated: 04/30/2019 Target Resolution Date: 05/14/2019 Goal Status: Active Interventions: Assess: immobility, friction, shearing, incontinence upon admission and as needed Treatment Activities: Patient referred for pressure reduction/relief devices : 04/30/2019 Notes: Wound/Skin Impairment Nursing Diagnoses: Impaired tissue integrity Goals: Ulcer/skin breakdown will have a volume reduction of 30% by week 4 Date Initiated: 04/30/2019 Target Resolution Date: 05/31/2019 Goal Status: Active Interventions: Assess patient/caregiver ability to obtain necessary supplies Assess ulceration(s) every visit Treatment Activities: Referred to DME Zeah Germano for dressing supplies : 04/30/2019 Skin care regimen initiated : 04/30/2019 Topical wound management initiated : 04/30/2019 Notes: [name], [name] ([id number]) Electronic Signature(s) Signed: 05/07/2019 4:35:47 PM By: Gretta Cool, BSN, RN, CWS, Kim RN, BSN Entered By: Gretta Cool, BSN, RN, CWS, Kim on 05/07/2019 11:11:32 [name] ([id number]) -------------------------------------------------------------------------------- Pain Assessment Details Patient Name: [name] Date of Service: 05/07/2019 10:30 AM Medical Record Number: [id number] Patient Account Number: [id number] Date of Birth/Sex: [birth year] (74 y.o. M) Treating RN: Cornell Barman Primary Care Babygirl Trager: [name] Other Clinician: Referring Kendric Sindelar: [name] Treating Georganne Siple/Extender: Melburn Hake, HOYT Weeks in  Treatment: 12 Active Problems Location of Pain Severity and Description of Pain Patient Has Paino No Site Locations Pain Management and Medication Current Pain Management: Goals for Pain Management Patient denies pain at this time. Electronic Signature(s) Signed: 05/07/2019 4:35:47 PM By: Gretta Cool, BSN, RN, CWS, Kim RN, BSN Entered By: Gretta Cool, BSN, RN, CWS, Kim on 05/07/2019 10:42:06 [name] ([id number]) -------------------------------------------------------------------------------- Patient/Caregiver Education Details Patient Name: [name] Date of Service: 05/07/2019 10:30 AM Medical Record Number: [id number] Patient Account Number: [id number] Date of Birth/Gender: 08/01/1945 (74 y.o. M) Treating RN: Cornell Barman Primary Care Physician: [name] Other Clinician: Referring Physician: [name] Treating Physician/Extender: Sharalyn Ink in Treatment: 12 Education Assessment Education Provided To: Patient Education Topics Provided Wound/Skin Impairment: Handouts: Caring for Your Ulcer Methods: Demonstration, Explain/Verbal Responses: State content correctly Electronic Signature(s) Signed: 05/07/2019 4:35:47 PM By: Gretta Cool, BSN, RN, CWS, Kim RN, BSN Entered By: Gretta Cool, BSN,  RN, CWS, Kim on 05/07/2019 11:20:01 [name], [name] ([id number]) -------------------------------------------------------------------------------- Wound Assessment Details Patient Name: [name], [name] Date of Service: 05/07/2019 10:30 AM Medical Record Number: [id number] Patient Account Number: [id number] Date of Birth/Sex: January 31, 1946 (74 y.o. M) Treating RN: Cornell Barman Primary Care Jaydalyn Demattia: [name] Other Clinician: Referring Cabela Pacifico: [name] Treating Yareni Creps/Extender: Melburn Hake, HOYT Weeks in Treatment: 12 Wound Status Wound Number: 5 Primary Diabetic Wound/Ulcer of the Lower Extremity Etiology: Wound Location: Left Foot - Lateral Wound  Open Wounding Event: Pressure Injury Status: Date Acquired: 01/19/2019 Comorbid Cataracts, Middle ear problems, Sleep Apnea, Weeks Of Treatment: 12 History: Hypertension, Type II Diabetes, Gout, Clustered Wound: No Osteoarthritis, Neuropathy Photos Wound Measurements Length: (cm) 0.4 % Reduction i Width: (cm) 0.4 % Reduction i Depth: (cm) 0.1 Epithelializa Area: (cm) 0.126 Undermining: Volume: (cm) 0.013 Starting Ending Pos Maximum Di n Area: 54.2% n Volume: 84.1% tion: None Yes Position (o'clock): 11 ition (o'clock): 2 stance: (cm) 0.1 Wound Description Classification: Grade 2 Foul Odor Af Wound Margin: Flat and Intact Slough/Fibri Exudate Amount: Medium Exudate Type: Serosanguineous Exudate Color: red, brown ter Cleansing: No no Yes Wound Bed Granulation Amount: Large (67-100%) Exposed Structure Granulation Quality: Pink Fascia Exposed: No Necrotic Amount: None Present (0%) Fat Layer (Subcutaneous Tissue) Exposed: Yes Tendon Exposed: No Muscle Exposed: No Joint Exposed: No Bone Exposed: No [name], [name] ([id number]) Treatment Notes Wound #5 (Left, Lateral Foot) Notes Silver collagen, Felt for offloading, conform Electronic Signature(s) Signed: 05/07/2019 4:35:47 PM By: Gretta Cool, BSN, RN, CWS, Kim RN, BSN Entered By: Gretta Cool, BSN, RN, CWS, Kim on 05/07/2019 10:47:45 [name] ([id number]) -------------------------------------------------------------------------------- Vitals Details Patient Name: [name] Date of Service: 05/07/2019 10:30 AM Medical Record Number: [id number] Patient Account Number: [id number] Date of Birth/Sex: May 08, 1945 (74 y.o. M) Treating RN: Cornell Barman Primary Care Makailey Hodgkin: [name] Other Clinician: Referring Beauden Tremont: [name] Treating Reshawn Ostlund/Extender: Melburn Hake, HOYT Weeks in Treatment: 12 Vital Signs Time Taken: 10:42 Temperature (F): 97.7 Height (in): 71 Pulse (bpm): 86 Weight (lbs):  205 Respiratory Rate (breaths/min): 16 Body Mass Index (BMI): 28.6 Blood Pressure (mmHg): 145/85 Reference Range: 80 - 120 mg / dl Electronic Signature(s) Signed: 05/07/2019 4:35:47 PM By: Gretta Cool, BSN, RN, CWS, Kim RN, BSN Entered By: Gretta Cool, BSN, RN, CWS, Kim on 05/07/2019 10:44:36

## 2019-05-07 NOTE — Progress Notes (Signed)
ORTON, CAPELL ([id number]) Visit Report for 05/07/2019 Chief Complaint Document Details Patient Name: LINDBERG, ZENON Date of Service: 05/07/2019 10:30 AM Medical Record Number: [id number] Patient Account Number: [id number] Date of Birth/Sex: August 07, 1945 (73 y.o. M) Treating RN: Cornell Barman Primary Care Provider: [name] Other Clinician: Referring Provider: [name] Treating Provider/Extender: Melburn Hake, Terrence Pizana Weeks in Treatment: 12 Information Obtained from: Patient Chief Complaint Left dorsal foot ulcer 02/09/2019; patient is here for review of the wound on the left lateral foot roughly at the level of the base of the fifth metatarsal Electronic Signature(s) Signed: 05/07/2019 1:51:25 PM By: Worthy Keeler [name] Entered By: Worthy Keeler on 05/07/2019 13:51:25 [name] ([id number]) -------------------------------------------------------------------------------- HPI Details Patient Name: [name] Date of Service: 05/07/2019 10:30 AM Medical Record Number: [id number] Patient Account Number: [id number] Date of Birth/Sex: 12/09/1945 (73 y.o. M) Treating RN: Cornell Barman Primary Care Provider: [name] Other Clinician: Referring Provider: [name] Treating Provider/Extender: Melburn Hake, Ashvik Grundman Weeks in Treatment: 12 History of Present Illness HPI Description: 10/04/16 on evaluation today patient presents for initial evaluation concerning a laceration of the right wrist and hand on the bowler aspect. He tells me that he fell into glass following a medication change in regard to his diabetes. He initially went to [facility] where she was sutured and subsequently his daughter who is a Marine scientist removed the teachers at the appropriate time. Unfortunately the wound has done poorly and dehissed which has caused him some trouble including infection. He has been on antibiotics both topically and orally though the wound continues to show signs of  necrotic tissue according to notes which is what he was sent to [place] for evaluation concerning. He does have diabetes and unfortunately this is severely uncontrolled. He does see an endocrinologist and is working on this. 10/11/2016 -- the patient's notes from the ER were reviewed and he had his suturing done on 09/17/2016 and had necrotic skin flaps which had to be trimmed the last visit he was here. He has managed to get his Santyl ointment and has been using this appropriately. 10/21/16 patient's wound appears to be doing very well on evaluation today. He still has some Slough covering the wound bed but this is nowhere near as severe as when i saw this initially two weeks ago nor even my review of his pictures last week. I'm pleased with how this is progressing. Patient's wound over the right form appears to be doing fairly well on evaluation today. There is no evidence of infection and he has a small area at the crease of where she is wrist extends and flexes that is still open and appears to be having difficulty due to the fact that it is cracking open. I think this is something that may be controlled with moisture control that is keeping the wound a little bit more moist. Nonetheless this is a tiny region compared to the initial wound and overall appears to be doing very well. No fevers, chills, nausea, or vomiting noted at this time. 11/25/16 on evaluation today patient appears to be doing well in regard to his right wrist ulcer. In fact this appears to be completely healed he is having no discomfort. He is pleased with how this is progressed. Readmission: 06/24/17 on evaluation today patient appears back in our clinic regarding issues that he has been having with his right ankle on the lateral malleolus as well is in the dorsal surface of his left foot. Both have  been going on for several weeks unfortunately. He tells me that initially this was being managed by Dr. Edilia Bo and I did review the  notes from Dr. Edilia Bo in epic. Patient was placed on amoxicillin initially and then subsequently Keflex following. He has been using triple antibiotic ointment over-the-counter along with a Band-Aid for the wound currently. He did not have any Santyl remaining. His most recent hemoglobin A1c which was just two days ago was 10.3. Fortunately he seems to have excellent blood flow in the bilateral lower extremities with his left ABI being 0.95 in the right ABI 1.01. Patient does have some pain in regard to each of these ulcerations but he tells me it's not nearly as severe as the hand wound that I help treat earlier over the summer. This was in 2018. 07/15/17 on evaluation today patient's lower surety ulcer is actually both appear to be doing fairly well at this point. He does have a little bit of skin overlapping the ulcer on the right lateral ankle this was debrided away today just with saline and gauze without complication patient had no discomfort or pain. Otherwise patient's left foot ulcer does appear to be showing signs of improvement he does seem to have a right great toe. Nikki and noted at this point in time. I think that this does need to be managed at this point. No fevers, chills, nausea, or vomiting noted at this time. 07/22/17 on evaluation today patient's wounds in regard to his lower extremities appear to be doing somewhat better. His right ankle wound in fact appears to likely be completely closed. The left dorsal foot ulcer though still open does appear to be a little bit smaller and does also seem to be making good progress. He has not been having problems with the treatment at this point in the general he does not seem to show as much erythema surrounding the wound bed which is good news. He is not VONG, GARRINGER E. ([id number]) having significant discomfort. 08/12/17 on evaluation today patients wounds of both appeared to be doing better in fact they both appear to be  completely healed. Overall I'm very pleased with how things have progressed over the past week even more than what I expected. Obviously this is good news and he is happy about this. He did have a fall when going to his orthopedic doctor recently fortunately he does not appear to have injured anything severely but nonetheless does have a couple of bumps and bruises nothing significant at this time but he needs to be seen and wound care for. He may have broken his left wrist he is following up with orthopedics in that regard. Readmission: 10/24/17 on evaluation today patient presents for reevaluation after having been discharged Aug 12, 2017. His wound was healed at that point although he states that it has been somewhat discolored since that time. He has not experienced the drainage as far as the wound is concerned but the fact that it was still discolored been greater than two months after the fact made him want to come in for reevaluation. Upon inspection today the patient has no pain although he does have neuropathy. He does have a discolored region of the dorsal surface of the left foot which was the site where the ulcer was that we previously treated. The good news is he does not again have any discomfort although obviously I do believe this may be some scar tissue and just discoloration in that regard. Again he hasn't  had any drainage or discharge since I last saw him and at this point I see no evidence of that either this area shows complete epithelialization. In general I do not feel like there's any significant issue at this point in that regard. 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 He has been in this clinic before in 2018 with a laceration on his right hand and then again in 2019 with an area over the right lateral malleolus. He has foot drop related to diabetic Beatties on the left and he wears an  AFO brace although he did not bring this in today. He states that the wound is been there for the last week or 2. It is been uncomfortable to walk on. The man is an active man having to look after an elderly mother. Past medical history, type 2 diabetes with polyneuropathy with a recent hemoglobin A1c in September 2011 0.3, lumbar disc disease, hypergonadism., Left foot drop and gout. We did not repeat his ABIs in the clinic quoting values from July 2019 of 0.95 and 1.01. He is not felt to have significant PAD 02/19/2019 on evaluation today patient actually appears to be doing better with regard to his foot wound based on what I am seeing currently. He was seen by Dr. Dellia Nims on readmission back into the clinic last week when I was off. The good news is the patient has been tolerating the dressing changes without any complication in fact has not really had much in the way of drainage. He does have an AFO brace which she believes is actually pushing on this area as well and has contributed to the issue. Fortunately there is no signs of active infection at this point. His ABIs appear to be well. 03/05/2019 on evaluation today patient appears to be doing well in regard to his foot ulcer. He has been tolerating the dressing changes without complication. Fortunately there is no signs of active infection. The wound is measuring somewhat smaller and overall I feel like he is making good progress at this point. 03/12/2019 on evaluation today patient actually appears to be doing quite well with regard to his foot ulcer. The wound is looking better the measurement is not significantly better at this point unfortunately but nonetheless again the overall appearance is improving I think he is making some progress. I still think the big issue here is foot drop and again the patient wants to see if potentially he could get a brace to help with this. Subsequently I think that we can definitely make a referral  to [facility] to see if they can help [place] in this regard. He is in agreement with that plan. 03/26/2019 on evaluation today patient appears to be doing more poorly in regard to his foot ulcer at this time. He did see podiatry in order to see about getting a brace but unfortunately he tells me that Medicare is not likely to pay for one for him as he had 1 I guess 8 months ago. However this one that apparently goes in his shoes that does not seem to be working out well for him. Unfortunately there are signs of active infection at this time. No fevers, chills, nausea, vomiting, or diarrhea. Systemically. The patient has erythema on the dorsal surface of his foot that tracks from the lateral portion of his foot and again he does have a blister around the wound as well which is worse compared  to previous. Overall I am concerned. 04/02/2019 on evaluation today patient appears to be doing about the same with regard to his wound. Unfortunately there is no signs of significant improvement although I do think there is no signs of infection which is good news. His drainage is also STUART, MIRABILE E. ([id number]) slowed down tremendously. I think at this point he would benefit from the initiation of a total contact cast. The only concern I have is that over his balance. I explained to the patient that if we were to initiate the cast I think it would be greatly beneficial for him but he would need to ensure not to walk at all unless he uses a walker he actually has 2 walkers one for inside his home and one that he keeps out in the car. For that reason he would be covered in that regard. I just do not want him to have any accidents and fall with the cast. 12/28-Patient comes in 1 week out a day early for his appointment he was in the hospital for 2 days for uncontrolled hyperglycemia and required management of high blood sugars, with some adjustments in his home regimen of insulin, patient stubbed his right great  toe today and put a Band-Aid on it, he is here for his left lateral foot ulcer and we are using silver alginate dressing, he was intact total contact cast with difficulty with balance to the extent that that had to be discontinued 04/16/2019 on evaluation today patient appears to be doing better with regard to the overall appearance of his wound size. This is good news. We had attempted a total contact cast but unfortunately he had some falling issues that the family contacted [place] about we took the cast off we did not put it back on. Nonetheless it ended up that just a couple days later he was in the hospital due to elevations in his blood sugar which went to around 500 and even a little higher. Subsequently he was admitted to the hospital and was in the hospital for a total of 3 days and that included Christmas day. Fortunately there got his blood sugar under control he tells me that this is running about 200 now which is much better news. There does not appear to be any signs of active infection at this time. No fevers, chills, nausea, vomiting, or diarrhea. 04/30/2019 on evaluation today patient actually appears to be doing slightly worse compared to previous as far as the overall size of his wound is concerned. There also is some need for sharp debridement today as well. Fortunately there is no evidence of active infection which is good news. No fevers, chills, nausea, vomiting, or diarrhea. 05/07/2019 upon evaluation today patient appears to be doing excellent in regard to his plantar foot ulcer. He has been tolerating the dressing changes without complication. Fortunately there is no signs of active infection at this time. No fevers, chills, nausea, vomiting, or diarrhea. He does tell me that he actually got in touch with [employer] and they feel like they can add diagnosis codes to his orders in order to get an external brace for him as opposed to the one that goes internal to issue. That would be  excellent for his foot drop and I did tell him that if there is anything I need to fill out in that regard I will be more than happy to oblige and fill out what is needed for [employer]. Electronic Signature(s) Signed: 05/07/2019 2:09:02 PM By:  Melburn Hake, Haylee Mcanany [name] Entered By: Worthy Keeler on 05/07/2019 14:09:01 [name] ([id number]) -------------------------------------------------------------------------------- Physical Exam Details Patient Name: GEOVANNIE, VILAR Date of Service: 05/07/2019 10:30 AM Medical Record Number: [id number] Patient Account Number: [id number] Date of Birth/Sex: 19-Nov-1945 (73 y.o. M) Treating RN: Cornell Barman Primary Care Provider: [name] Other Clinician: Referring Provider: [name] Treating Provider/Extender: [name], Nahuel Wilbert Weeks in Treatment: 61 Constitutional Well-nourished and well-hydrated in no acute distress. Respiratory normal breathing without difficulty. Psychiatric this patient is able to make decisions and demonstrates good insight into disease process. Alert and Oriented x 3. pleasant and cooperative. Notes Patient's wound bed currently showed signs of good epithelization around the edges there does not appear to be any significant breakdown or pressure to the area I do believe the offloading felt was beneficial for him which is great news. Overall I am seeing dramatic improvement even compared to last time I saw him. Electronic Signature(s) Signed: 05/07/2019 2:09:21 PM By: Worthy Keeler [name] Entered By: Worthy Keeler on 05/07/2019 14:09:21 [name] ([id number]) -------------------------------------------------------------------------------- Physician Orders Details Patient Name: [name] Date of Service: 05/07/2019 10:30 AM Medical Record Number: [id number] Patient Account Number: [id number] Date of Birth/Sex: 13-Jan-1946 (73 y.o. M) Treating RN: Cornell Barman Primary Care Provider: [name]  Other Clinician: Referring Provider: [name] Treating Provider/Extender: Melburn Hake, Darrly Loberg Weeks in Treatment: 12 Verbal / Phone Orders: No Diagnosis Coding Wound Cleansing Wound #5 Left,Lateral Foot o May shower with protection. Anesthetic (add to Medication List) Wound #5 Left,Lateral Foot o Topical Lidocaine 4% cream applied to wound bed prior to debridement (In Clinic Only). Primary Wound Dressing Wound #5 Left,Lateral Foot o Silver Collagen Secondary Dressing Wound #5 Left,Lateral Foot o Conform/Kerlix - Rolled gauze o Other - Felt to offload Follow-up Appointments Wound #5 Left,Lateral Foot o Return Appointment in 1 week. Off-Loading o Turn and reposition every 2 hours - Elevate foot when sitting Additional Orders / Instructions Wound #5 Left,Lateral Foot o Other: - Patient to follow-up with [employer] for adjustments to braces and shoes. Electronic Signature(s) Signed: 05/07/2019 4:16:35 PM By: Worthy Keeler [name] Signed: 05/07/2019 4:35:47 PM By: Gretta Cool BSN, RN, CWS, Kim RN, BSN Entered By: Gretta Cool, BSN, RN, CWS, Kim on 05/07/2019 11:19:22 AMEER, SANDEN ([id number]) -------------------------------------------------------------------------------- Problem List Details Patient Name: [name] Date of Service: 05/07/2019 10:30 AM Medical Record Number: [id number] Patient Account Number: [id number] Date of Birth/Sex: 02-24-1946 (73 y.o. M) Treating RN: Cornell Barman Primary Care Provider: [name] Other Clinician: Referring Provider: [name] Treating Provider/Extender: Melburn Hake, Kimberleigh Mehan Weeks in Treatment: 12 Active Problems ICD-10 Evaluated Encounter Code Description Active Date Today Diagnosis E11.621 Type 2 diabetes mellitus with foot ulcer 02/09/2019 No Yes E11.42 Type 2 diabetes mellitus with diabetic polyneuropathy 02/09/2019 No Yes L97.522 Non-pressure chronic ulcer of other part of left foot with fat 02/09/2019 No  Yes layer exposed M21.372 Foot drop, left foot 02/09/2019 No Yes Inactive Problems Resolved Problems Electronic Signature(s) Signed: 05/07/2019 1:51:16 PM By: Worthy Keeler [name] Entered By: Worthy Keeler on 05/07/2019 13:51:16 [name] ([id number]) -------------------------------------------------------------------------------- Progress Note Details Patient Name: [name] Date of Service: 05/07/2019 10:30 AM Medical Record Number: [id number] Patient Account Number: [id number] Date of Birth/Sex: [birth year] (73 y.o. M) Treating RN: Cornell Barman Primary Care Provider: [name] Other Clinician: Referring Provider: [name] Treating Provider/Extender: Melburn Hake, Abby Tucholski Weeks in Treatment: 12 Subjective Chief Complaint Information obtained from Patient Left dorsal foot ulcer 02/09/2019; patient is  here for review of the wound on the left lateral foot roughly at the level of the base of the fifth metatarsal History of Present Illness (HPI) 10/04/16 on evaluation today patient presents for initial evaluation concerning a laceration of the right wrist and hand on the bowler aspect. He tells me that he fell into glass following a medication change in regard to his diabetes. He initially went to [facility] where she was sutured and subsequently his daughter who is a Marine scientist removed the teachers at the appropriate time. Unfortunately the wound has done poorly and dehissed which has caused him some trouble including infection. He has been on antibiotics both topically and orally though the wound continues to show signs of necrotic tissue according to notes which is what he was sent to [place] for evaluation concerning. He does have diabetes and unfortunately this is severely uncontrolled. He does see an endocrinologist and is working on this. 10/11/2016 -- the patient's notes from the ER were reviewed and he had his suturing done on 09/17/2016 and had necrotic skin flaps  which had to be trimmed the last visit he was here. He has managed to get his Santyl ointment and has been using this appropriately. 10/21/16 patient's wound appears to be doing very well on evaluation today. He still has some Slough covering the wound bed but this is nowhere near as severe as when i saw this initially two weeks ago nor even my review of his pictures last week. I'm pleased with how this is progressing. Patient's wound over the right form appears to be doing fairly well on evaluation today. There is no evidence of infection and he has a small area at the crease of where she is wrist extends and flexes that is still open and appears to be having difficulty due to the fact that it is cracking open. I think this is something that may be controlled with moisture control that is keeping the wound a little bit more moist. Nonetheless this is a tiny region compared to the initial wound and overall appears to be doing very well. No fevers, chills, nausea, or vomiting noted at this time. 11/25/16 on evaluation today patient appears to be doing well in regard to his right wrist ulcer. In fact this appears to be completely healed he is having no discomfort. He is pleased with how this is progressed. Readmission: 06/24/17 on evaluation today patient appears back in our clinic regarding issues that he has been having with his right ankle on the lateral malleolus as well is in the dorsal surface of his left foot. Both have been going on for several weeks unfortunately. He tells me that initially this was being managed by Dr. Edilia Bo and I did review the notes from Dr. Edilia Bo in epic. Patient was placed on amoxicillin initially and then subsequently Keflex following. He has been using triple antibiotic ointment over-the-counter along with a Band-Aid for the wound currently. He did not have any Santyl remaining. His most recent hemoglobin A1c which was just two days ago was 10.3. Fortunately he  seems to have excellent blood flow in the bilateral lower extremities with his left ABI being 0.95 in the right ABI 1.01. Patient does have some pain in regard to each of these ulcerations but he tells me it's not nearly as severe as the hand wound that I help treat earlier over the summer. This was in 2018. 07/15/17 on evaluation today patient's lower surety ulcer is actually both appear to  be doing fairly well at this point. He does have a little bit of skin overlapping the ulcer on the right lateral ankle this was debrided away today just with saline and gauze without complication patient had no discomfort or pain. Otherwise patient's left foot ulcer does appear to be showing signs of ESPEN, BETHEL. ([id number]) improvement he does seem to have a right great toe. Nikki and noted at this point in time. I think that this does need to be managed at this point. No fevers, chills, nausea, or vomiting noted at this time. 07/22/17 on evaluation today patient's wounds in regard to his lower extremities appear to be doing somewhat better. His right ankle wound in fact appears to likely be completely closed. The left dorsal foot ulcer though still open does appear to be a little bit smaller and does also seem to be making good progress. He has not been having problems with the treatment at this point in the general he does not seem to show as much erythema surrounding the wound bed which is good news. He is not having significant discomfort. 08/12/17 on evaluation today patients wounds of both appeared to be doing better in fact they both appear to be completely healed. Overall I'm very pleased with how things have progressed over the past week even more than what I expected. Obviously this is good news and he is happy about this. He did have a fall when going to his orthopedic doctor recently fortunately he does not appear to have injured anything severely but nonetheless does have a couple of bumps and  bruises nothing significant at this time but he needs to be seen and wound care for. He may have broken his left wrist he is following up with orthopedics in that regard. Readmission: 10/24/17 on evaluation today patient presents for reevaluation after having been discharged Aug 12, 2017. His wound was healed at that point although he states that it has been somewhat discolored since that time. He has not experienced the drainage as far as the wound is concerned but the fact that it was still discolored been greater than two months after the fact made him want to come in for reevaluation. Upon inspection today the patient has no pain although he does have neuropathy. He does have a discolored region of the dorsal surface of the left foot which was the site where the ulcer was that we previously treated. The good news is he does not again have any discomfort although obviously I do believe this may be some scar tissue and just discoloration in that regard. Again he hasn't had any drainage or discharge since I last saw him and at this point I see no evidence of that either this area shows complete epithelialization. In general I do not feel like there's any significant issue at this point in that regard. 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 He has been in this clinic before in 2018 with a laceration on his right hand and then again in 2019 with an area over the right lateral malleolus. 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 He states that the wound is been there for the last week or 2. It is been uncomfortable to walk on. The man is an active man having to look after an elderly mother. Past medical history, type  2 diabetes with polyneuropathy with a recent hemoglobin A1c in September 2011 0.3, lumbar disc disease,  hypergonadism., Left foot drop and gout. We did not repeat his ABIs in the clinic quoting values from July 2019 of 0.95 and 1.01. He is not felt to have significant PAD 02/19/2019 on evaluation today patient actually appears to be doing better with regard to his foot wound based on what I am seeing currently. He was seen by Dr. Dellia Nims on readmission back into the clinic last week when I was off. The good news is the patient has been tolerating the dressing changes without any complication in fact has not really had much in the way of drainage. He does have an AFO brace which she believes is actually pushing on this area as well and has contributed to the issue. Fortunately there is no signs of active infection at this point. His ABIs appear to be well. 03/05/2019 on evaluation today patient appears to be doing well in regard to his foot ulcer. He has been tolerating the dressing changes without complication. Fortunately there is no signs of active infection. The wound is measuring somewhat smaller and overall I feel like he is making good progress at this point. 03/12/2019 on evaluation today patient actually appears to be doing quite well with regard to his foot ulcer. The wound is looking better the measurement is not significantly better at this point unfortunately but nonetheless again the overall appearance is improving I think he is making some progress. I still think the big issue here is foot drop and again the patient wants to see if potentially he could get a brace to help with this. Subsequently I think that we can definitely make a referral to [facility] to see if they can help [place] in this regard. He is in agreement with that plan. 03/26/2019 on evaluation today patient appears to be doing more poorly in regard to his foot ulcer at this time. He did see podiatry in order to see about getting a brace but unfortunately he tells me that Medicare is not likely to pay for one for him  as QUINNTEN, CALVIN. ([id number]) he had 1 I guess 8 months ago. However this one that apparently goes in his shoes that does not seem to be working out well for him. Unfortunately there are signs of active infection at this time. No fevers, chills, nausea, vomiting, or diarrhea. Systemically. The patient has erythema on the dorsal surface of his foot that tracks from the lateral portion of his foot and again he does have a blister around the wound as well which is worse compared to previous. Overall I am concerned. 04/02/2019 on evaluation today patient appears to be doing about the same with regard to his wound. Unfortunately there is no signs of significant improvement although I do think there is no signs of infection which is good news. His drainage is also slowed down tremendously. I think at this point he would benefit from the initiation of a total contact cast. The only concern I have is that over his balance. I explained to the patient that if we were to initiate the cast I think it would be greatly beneficial for him but he would need to ensure not to walk at all unless he uses a walker he actually has 2 walkers one for inside his home and one that he keeps out in the car. For that reason he would be covered in that regard. I just do  not want him to have any accidents and fall with the cast. 12/28-Patient comes in 1 week out a day early for his appointment he was in the hospital for 2 days for uncontrolled hyperglycemia and required management of high blood sugars, with some adjustments in his home regimen of insulin, patient stubbed his right great toe today and put a Band-Aid on it, he is here for his left lateral foot ulcer and we are using silver alginate dressing, he was intact total contact cast with difficulty with balance to the extent that that had to be discontinued 04/16/2019 on evaluation today patient appears to be doing better with regard to the overall appearance of his wound  size. This is good news. We had attempted a total contact cast but unfortunately he had some falling issues that the family contacted [place] about we took the cast off we did not put it back on. Nonetheless it ended up that just a couple days later he was in the hospital due to elevations in his blood sugar which went to around 500 and even a little higher. Subsequently he was admitted to the hospital and was in the hospital for a total of 3 days and that included Christmas day. Fortunately there got his blood sugar under control he tells me that this is running about 200 now which is much better news. There does not appear to be any signs of active infection at this time. No fevers, chills, nausea, vomiting, or diarrhea. 04/30/2019 on evaluation today patient actually appears to be doing slightly worse compared to previous as far as the overall size of his wound is concerned. There also is some need for sharp debridement today as well. Fortunately there is no evidence of active infection which is good news. No fevers, chills, nausea, vomiting, or diarrhea. 05/07/2019 upon evaluation today patient appears to be doing excellent in regard to his plantar foot ulcer. He has been tolerating the dressing changes without complication. Fortunately there is no signs of active infection at this time. No fevers, chills, nausea, vomiting, or diarrhea. He does tell me that he actually got in touch with [facility] and they feel like they can add diagnosis codes to his orders in order to get an external brace for him as opposed to the one that goes internal to issue. That would be excellent for his foot drop and I did tell him that if there is anything I need to fill out in that regard I will be more than happy to oblige and fill out what is needed for [facility]. Objective Constitutional Well-nourished and well-hydrated in no acute distress. Vitals Time Taken: 10:42 AM, Height: 71 in, Weight: 205 lbs, BMI: 28.6,  Temperature: 97.7 F, Pulse: 86 bpm, Respiratory Rate: 16 breaths/min, Blood Pressure: 145/85 mmHg. Respiratory normal breathing without difficulty. Psychiatric this patient is able to make decisions and demonstrates good insight into disease process. Alert and Oriented x 3. pleasant LEMONTE, AL. ([id number]) and cooperative. General Notes: Patient's wound bed currently showed signs of good epithelization around the edges there does not appear to be any significant breakdown or pressure to the area I do believe the offloading felt was beneficial for him which is great news. Overall I am seeing dramatic improvement even compared to last time I saw him. Integumentary (Hair, Skin) Wound #5 status is Open. Original cause of wound was Pressure Injury. The wound is located on the Left,Lateral Foot. The wound measures 0.4cm length x 0.4cm width x 0.1cm depth; 0.126cm^2  area and 0.013cm^3 volume. There is Fat Layer (Subcutaneous Tissue) Exposed exposed. There is undermining starting at 11:00 and ending at 2:00 with a maximum distance of 0.1cm. There is a medium amount of serosanguineous drainage noted. The wound margin is flat and intact. There is large (67-100%) pink granulation within the wound bed. There is no necrotic tissue within the wound bed. 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 Anesthetic (add to Medication List): Wound #5 Left,Lateral Foot: Topical Lidocaine 4% cream applied to wound bed prior to debridement (In Clinic Only). Primary Wound Dressing: Wound #5 Left,Lateral Foot: Silver Collagen Secondary Dressing: Wound #5 Left,Lateral Foot: Conform/Kerlix - Rolled gauze Other - Felt to offload Follow-up  Appointments: Wound #5 Left,Lateral Foot: Return Appointment in 1 week. Off-Loading: Turn and reposition every 2 hours - Elevate foot when sitting Additional Orders / Instructions: Wound #5 Left,Lateral Foot: Other: - Patient to follow-up with [facility] for adjustments to braces and shoes. ADALBERTO, METZGAR E. ([id number]) 1. My suggestion at this time is good to be that we go ahead and initiate treatment with a continuation of the current wound care measures which includes a silver collagen dressing with felt to offload. 2. I am in a suggest as well the patient continue to just wear his normal shoe although he is looking at getting the braces for the foot drop as well to be external to the shoe that would be excellent and I think would do him a lot of good. 3. I recommend the patient continue to try to avoid walking as much as possible to help this area to heal as effectively and quickly as possible. He is in agreement with plan. We will see patient back for reevaluation in 1 week here in the clinic. If anything worsens or changes patient will contact our office for additional recommendations. Electronic Signature(s) Signed: 05/07/2019 2:12:42 PM By: Worthy Keeler [name] Entered By: Worthy Keeler on 05/07/2019 14:12:41 [name] ([id number]) -------------------------------------------------------------------------------- SuperBill Details Patient Name: [name] Date of Service: 05/07/2019 Medical Record Number: [id number] Patient Account Number: [id number] Date of Birth/Sex: Aug 27, 1945 (73 y.o. M) Treating RN: Cornell Barman Primary Care Provider: [name] Other Clinician: Referring Provider: [name] Treating Provider/Extender: Melburn Hake, Paddy Walthall Weeks in Treatment: 12 Diagnosis Coding ICD-10 Codes Code Description E11.621 Type 2 diabetes mellitus with foot ulcer E11.42 Type 2 diabetes mellitus with diabetic polyneuropathy L97.522 Non-pressure chronic ulcer of  other part of left foot with fat layer exposed M21.372 Foot drop, left foot Facility Procedures CPT4 Code: [id number] Description: 99213 - WOUND CARE VISIT-LEV 3 EST PT Modifier: Quantity: 1 Physician Procedures CPT4 Code: 1941740 Description: 81448 - WC PHYS LEVEL 3 - EST PT ICD-10 Diagnosis Description E11.621 Type 2 diabetes mellitus with foot ulcer E11.42 Type 2 diabetes mellitus with diabetic polyneuropathy L97.522 Non-pressure chronic ulcer of other part of left foot with fa  M21.372 Foot drop, left foot Modifier: t layer exposed Quantity: 1 Electronic Signature(s) Signed: 05/07/2019 2:12:59 PM By: Worthy Keeler [name] Entered By: Worthy Keeler on 05/07/2019 14:12:59

## 2019-05-10 ENCOUNTER — Other Ambulatory Visit: Payer: Self-pay

## 2019-05-14 ENCOUNTER — Encounter: Payer: Medicare Other | Attending: Physician Assistant | Admitting: Physician Assistant

## 2019-05-14 ENCOUNTER — Encounter: Payer: Self-pay | Admitting: Internal Medicine

## 2019-05-14 ENCOUNTER — Telehealth: Payer: Self-pay

## 2019-05-14 ENCOUNTER — Other Ambulatory Visit: Payer: Self-pay

## 2019-05-14 ENCOUNTER — Ambulatory Visit (INDEPENDENT_AMBULATORY_CARE_PROVIDER_SITE_OTHER): Payer: Medicare Other | Admitting: Internal Medicine

## 2019-05-14 VITALS — BP 124/70 | HR 78 | Ht 71.5 in | Wt 202.4 lb

## 2019-05-14 DIAGNOSIS — E781 Pure hyperglyceridemia: Secondary | ICD-10-CM | POA: Diagnosis not present

## 2019-05-14 DIAGNOSIS — M1990 Unspecified osteoarthritis, unspecified site: Secondary | ICD-10-CM | POA: Diagnosis not present

## 2019-05-14 DIAGNOSIS — E1165 Type 2 diabetes mellitus with hyperglycemia: Secondary | ICD-10-CM | POA: Diagnosis not present

## 2019-05-14 DIAGNOSIS — Z833 Family history of diabetes mellitus: Secondary | ICD-10-CM | POA: Insufficient documentation

## 2019-05-14 DIAGNOSIS — Z87891 Personal history of nicotine dependence: Secondary | ICD-10-CM | POA: Diagnosis not present

## 2019-05-14 DIAGNOSIS — Z794 Long term (current) use of insulin: Secondary | ICD-10-CM | POA: Diagnosis not present

## 2019-05-14 DIAGNOSIS — E11621 Type 2 diabetes mellitus with foot ulcer: Secondary | ICD-10-CM | POA: Insufficient documentation

## 2019-05-14 DIAGNOSIS — E1151 Type 2 diabetes mellitus with diabetic peripheral angiopathy without gangrene: Secondary | ICD-10-CM | POA: Insufficient documentation

## 2019-05-14 DIAGNOSIS — M21372 Foot drop, left foot: Secondary | ICD-10-CM | POA: Insufficient documentation

## 2019-05-14 DIAGNOSIS — IMO0002 Reserved for concepts with insufficient information to code with codable children: Secondary | ICD-10-CM

## 2019-05-14 DIAGNOSIS — Z809 Family history of malignant neoplasm, unspecified: Secondary | ICD-10-CM | POA: Diagnosis not present

## 2019-05-14 DIAGNOSIS — E663 Overweight: Secondary | ICD-10-CM | POA: Diagnosis not present

## 2019-05-14 DIAGNOSIS — I10 Essential (primary) hypertension: Secondary | ICD-10-CM | POA: Insufficient documentation

## 2019-05-14 DIAGNOSIS — E1142 Type 2 diabetes mellitus with diabetic polyneuropathy: Secondary | ICD-10-CM | POA: Diagnosis not present

## 2019-05-14 DIAGNOSIS — Z8249 Family history of ischemic heart disease and other diseases of the circulatory system: Secondary | ICD-10-CM | POA: Insufficient documentation

## 2019-05-14 DIAGNOSIS — I252 Old myocardial infarction: Secondary | ICD-10-CM | POA: Diagnosis not present

## 2019-05-14 DIAGNOSIS — M109 Gout, unspecified: Secondary | ICD-10-CM | POA: Insufficient documentation

## 2019-05-14 DIAGNOSIS — L97522 Non-pressure chronic ulcer of other part of left foot with fat layer exposed: Secondary | ICD-10-CM | POA: Diagnosis not present

## 2019-05-14 DIAGNOSIS — Z823 Family history of stroke: Secondary | ICD-10-CM | POA: Diagnosis not present

## 2019-05-14 DIAGNOSIS — L97521 Non-pressure chronic ulcer of other part of left foot limited to breakdown of skin: Secondary | ICD-10-CM | POA: Diagnosis not present

## 2019-05-14 NOTE — Patient Instructions (Addendum)
Please continue: -Ozempic 0.5 mg weekly -Also: Type of insulin Breakfast Lunch Dinner  Humulin R 20-25 - 20-25  Humulin N 30 - 25   Please return in 3-4 months with your sugar log.

## 2019-05-14 NOTE — Telephone Encounter (Signed)
Pt has apt on 2/3 for [place] for testosterone injection. Pt needs screened for covid. Tried to contact pt but no answer and no VM

## 2019-05-14 NOTE — Progress Notes (Signed)
Patient ID: [name], male   DOB: 12-26-1945, 74 y.o.   [name]: [id number]  This visit occurred during the SARS-CoV-2 public health emergency.  Safety protocols were in place, including screening questions prior to the visit, additional usage of staff PPE, and extensive cleaning of exam room while observing appropriate contact time as indicated for disinfecting solutions.   HPI: [name] is a 74 y.o.-year-old male, presenting for f/u for DM2, dx 1997, insulin-dependent since 2010, uncontrolled, with complications (CKD stage 3, PN, ED, TIA). Last visit 7 months ago (virtual).  Reviewed HbA1c levels Lab Results  Component Value Date   HGBA1C 12.0 (H) 04/07/2019   HGBA1C 11.6 (H) 01/03/2019   HGBA1C 12.5 (H) 04/13/2018   He was on:  U500 insulin: - draw up to 20 units on the syringe before b'fast (100 units) - draw up to 10 (not 12) units on the syringe before lunch (60 units) - draw up to 20 (sometimes 25) units on the syringe before dinner (100 units)  He is now on Type of: insulin Breakfast Lunch Dinner  Humulin R 35 >> 40 >> 20  35 >> 40 >> 20  Humulin N 35 >> 40 >> 20  35 >> 40 >> 45 >> 20  He is still missing some insulin doses but not as many as before.  -Ozempic 0.5 mg weekly-started 04/2018 -no GI side effects - just restarted it in 04/2019 after stopping in 10/2018 2/2 being in the doughnut hole...  Pt checks his sugars twice a week:-He cannot check more often due to pain in fingers from neuropathy: - am:  72, 177-400 >> 300s >> 130-140 >> 215-250 - 2h after b'fast: n/c  - before lunch: 80, 114-289 >> 80s-460 >> 160 >> n/c - 2h after lunch: n/c >> 300s >> n/c - before dinner: 364 >> 198, 335 >> n/c - 2h after dinner: n/c - bedtime: 80, 137-399 >> 200-300s >> 135-150 >> 200s-300 - nighttime: 60s >> n/c >> 286 >> n/c >> 78 >> n/c Lowest sugar was 72 >> 80s >> 78 >> 150.  he has hypoglycemia awareness at 100. Highest sugar was 460 >> 240 >> 400-500.  Pt's meals  are: - Breakfast: 3 pancakes with diet syrup - Lunch: tuna or cheese sandwich + chips - Dinner: veggies + starch - Snacks: 4-5 x a day He stopped bread.  He lost ~60 lbs in last 2 years after the his wife's death.  -+ CKD, last BUN/creatinine:  Lab Results  Component Value Date   BUN 23 04/18/2019   CREATININE 1.50 04/18/2019  ACR on 12/12/2012.  On lisinopril. -+ HL; last set of lipids: Lab Results  Component Value Date   CHOL 172 01/03/2019   HDL 31.40 (L) 01/03/2019   LDLCALC 43 04/24/2009   LDLDIRECT 82.0 01/03/2019   TRIG (H) 01/03/2019    521.0 Triglyceride is over 400; calculations on Lipids are invalid.   CHOLHDL 5 01/03/2019  On Zocor 40. - last eye exam 08/2018: No DR; [facility].  He has a history of cataract surgeries.  Before last visit, he had shingles in the left eye. - + numbness and tingling in his feet. + footdrop L  - also has an ulcer - seeing wound care >> improving.  He has Alzheimer dementia and is on Wellbutrin, Namenda, Aricept.  Of note, he had a very low vitamin B12, under 200, in the past.  He is on B12 supplementation.  His wife died in  2019 - PE.  ROS: Constitutional: no weight gain/+ weight loss, no fatigue, no subjective hyperthermia, no subjective hypothermia Eyes: no blurry vision, no xerophthalmia ENT: no sore throat, no nodules palpated in neck, no dysphagia, no odynophagia, no hoarseness Cardiovascular: no CP/no SOB/no palpitations/no leg swelling Respiratory: no cough/no SOB/no wheezing Gastrointestinal: no N/no V/no D/no C/no acid reflux Musculoskeletal: no muscle aches/no joint aches Skin: no rashes, no hair loss Neurological: no tremors/+ numbness/+ tingling/no dizziness  I reviewed pt's medications, allergies, PMH, social hx, family hx, and changes were documented in the history of present illness. Otherwise, unchanged from my initial visit note.  Past Medical History:  Diagnosis Date  . Allergic rhinitis due to  pollen   . Chronic kidney disease, stage III (moderate) 12/19/2012  . Depression   . Diabetes mellitus ([place])   . Diverticulosis   . Erectile dysfunction associated with type 2 diabetes mellitus ([place]) 05/28/2015  . Former very heavy cigarette smoker (more than 40 per day) 10/07/2014  . GERD (gastroesophageal reflux disease)   . Gout   . Hyperlipidemia   . Hypertension   . Hypogonadism male 06/14/2012  . Iron deficiency   . Memory loss   . Osteoarthritis   . Personal history of colonic polyps    1996   Past Surgical History:  Procedure Laterality Date  . CARDIAC CATHETERIZATION  11/2011   [facility]  . CHOLECYSTECTOMY  1980  . ESOPHAGOGASTRODUODENOSCOPY (EGD) WITH PROPOFOL N/A 09/27/2017   Procedure: ESOPHAGOGASTRODUODENOSCOPY (EGD) WITH PROPOFOL;  Surgeon: Lin Landsman, MD;  Location: [facility];  Service: Gastroenterology;  Laterality: N/A;  . EYE SURGERY Right 07/29/2016   cataract - Dr. Manuella Ghazi  . EYE SURGERY Left 08/23/2106   cataract - Dr. Manuella Ghazi  . TOTAL HIP ARTHROPLASTY  2009   Dr. Gladstone Lighter  . TOTAL HIP ARTHROPLASTY     Social History   Socioeconomic History  . Marital status: Widowed    Spouse name: Not on file  . Number of children: 4  . Years of education: 12th  . Highest education level: Not on file  Occupational History  . Occupation: Counsellor  . Occupation: retired Magazine features editor: [employer]  Tobacco Use  . Smoking status: Former Smoker    Packs/day: 3.00    Years: 35.00    Pack years: 105.00    Types: Cigarettes    Quit date: 04/12/2000    Years since quitting: 19.0  . Smokeless tobacco: Never Used  Substance and Sexual Activity  . Alcohol use: No  . Drug use: No  . Sexual activity: Never  Other Topics Concern  . Not on file  Social History Narrative   No regular exercise   Caffeine use: yes, dt coke   Lives at home with his mother lives with him.   Wife [name] recently deceased   Right-handed.   Social Determinants of Health    Financial Resource Strain: Low Risk   . Difficulty of Paying Living Expenses: Not hard at all  Food Insecurity: No Food Insecurity  . Worried About Charity fundraiser in the Last Year: Never true  . Ran Out of Food in the Last Year: Never true  Transportation Needs: No Transportation Needs  . Lack of Transportation (Medical): No  . Lack of Transportation (Non-Medical): No  Physical Activity: Inactive  . Days of Exercise per Week: 0 days  . Minutes of Exercise per Session: 0 min  Stress: No Stress Concern Present  . Feeling of  Stress : Not at all  Social Connections:   . Frequency of Communication with Friends and Family: Not on file  . Frequency of Social Gatherings with Friends and Family: Not on file  . Attends Religious Services: Not on file  . Active Member of Clubs or Organizations: Not on file  . Attends Archivist Meetings: Not on file  . Marital Status: Not on file  Intimate Partner Violence: Not At Risk  . Fear of Current or Ex-Partner: No  . Emotionally Abused: No  . Physically Abused: No  . Sexually Abused: No   Current Outpatient Medications on File Prior to Visit  Medication Sig Dispense Refill  . acetaminophen (QC ACETAMINOPHEN 8HR ARTH PAIN) 650 MG CR tablet Take 650 mg by mouth every 8 (eight) hours as needed for pain.    [name] Kitchen ampicillin (PRINCIPEN) 500 MG capsule Take 500 mg by mouth daily.    . Ascorbic Acid (VITAMIN C) 1000 MG tablet Take 1,000 mg by mouth daily.    [name] Kitchen aspirin EC 81 MG tablet Take 81 mg by mouth at bedtime.    [name] Kitchen atenolol (TENORMIN) 50 MG tablet TAKE 1 TABLET BY MOUTH EVERY DAY 90 tablet 1  . cholecalciferol (VITAMIN D) 1000 UNITS tablet Take 1,000 Units by mouth daily.      . colchicine 0.6 MG tablet Take 1 tablet (0.6 mg total) by mouth 2 (two) times daily. 60 tablet 2  . donepezil (ARICEPT) 10 MG tablet Take 1 tablet (10 mg total) by mouth at bedtime. 90 tablet 3  . doxazosin (CARDURA) 8 MG tablet TAKE ONE-HALF TABLET AT BEDTIME 45  tablet 3  . feeding supplement, GLUCERNA SHAKE, (GLUCERNA SHAKE) LIQD Take 237 mLs by mouth 3 (three) times daily between meals. 10000 mL 0  . finasteride (PROSCAR) 5 MG tablet Take 1 tablet (5 mg total) by mouth daily. 30 tablet 2  . fluconazole (DIFLUCAN) 100 MG tablet Take 1 tablet (100 mg total) by mouth daily. 5 tablet 0  . fluticasone (FLONASE) 50 MCG/ACT nasal spray PLACE 2 SPRAYS IN EACH NOSTRIL DAILY 48 mL 3  . ibuprofen (ADVIL,MOTRIN) 200 MG tablet Take 200-400 mg by mouth every 6 (six) hours as needed for headache (pain).    . indomethacin (INDOCIN) 50 MG capsule Take 1 capsule (50 mg total) by mouth 2 (two) times daily with a meal. 30 capsule 0  . insulin NPH Human (HUMULIN N,NOVOLIN N) 100 UNIT/ML injection Inject 0.25 mLs (25 Units total) into the skin 2 (two) times daily before a meal. 10 mL   . insulin regular (NOVOLIN R,HUMULIN R) 100 units/mL injection Inject 0.2 mLs (20 Units total) into the skin 2 (two) times daily before a meal. 10 mL   . lisinopril (ZESTRIL) 10 MG tablet TAKE 1 TABLET BY MOUTH EVERY DAY 90 tablet 3  . memantine (NAMENDA) 10 MG tablet Take 1 tablet (10 mg total) by mouth 2 (two) times daily. 180 tablet 3  . nutrition supplement, JUVEN, (JUVEN) PACK Take 1 packet by mouth 2 (two) times daily between meals. 30 packet 0  . omeprazole (PRILOSEC) 40 MG capsule Take 1 capsule (40 mg total) by mouth 2 (two) times daily before a meal. 180 capsule 1  . ondansetron (ZOFRAN) 4 MG tablet Take 1 tablet (4 mg total) by mouth every 8 (eight) hours as needed for nausea or vomiting. (Patient not taking: Reported on 04/07/2019) 30 tablet 1  . ondansetron (ZOFRAN-ODT) 8 MG disintegrating tablet Take 1 tablet (8 mg  total) by mouth 2 (two) times daily. 60 tablet 2  . Semaglutide,0.25 or 0.5MG /DOS, (OZEMPIC, 0.25 OR 0.5 MG/DOSE,) 2 MG/1.5ML SOPN Inject 0.5 mg into the skin once a week. (Patient not taking: Reported on 04/18/2019) 3 pen 0  . sildenafil (REVATIO) 20 MG tablet Generic  Revatio / Sildanefil 20 mg. 2 - 5 tabs 30 mins prior to intercourse. 10 tablet 11  . simvastatin (ZOCOR) 40 MG tablet TAKE 1 TABLET BY MOUTH AT BEDTIME 90 tablet 2  . testosterone cypionate (DEPOTESTOSTERONE CYPIONATE) 200 MG/ML injection Inject 0.75 mLs (150 mg total) into the muscle every 14 (fourteen) days. 6 mL 1  . valACYclovir (VALTREX) 1000 MG tablet Take 1,000 mg by mouth 3 (three) times daily.    [name] Kitchen venlafaxine XR (EFFEXOR-XR) 75 MG 24 hr capsule TAKE 1 CAPSULE BY MOUTH EVERY MORNING AND 2 CAPSULES AT BEDTIME 270 capsule 1   Current Facility-Administered Medications on File Prior to Visit  Medication Dose Route Frequency Provider Last Rate Last Admin  . testosterone cypionate (DEPOTESTOSTERONE CYPIONATE) injection 100 mg  100 mg Intramuscular Q14 Days Copland, Spencer, MD   100 mg at 03/21/19 1226  . testosterone cypionate (DEPOTESTOSTERONE CYPIONATE) injection 150 mg  150 mg Intramuscular Q14 Days Tonia Ghent, MD   150 mg at 01/25/19 0948  . testosterone cypionate (DEPOTESTOSTERONE CYPIONATE) injection 150 mg  150 mg Intramuscular Q14 Days Copland, Spencer, MD   150 mg at 02/21/19 1744   Allergies  Allergen Reactions  . Tetracycline Itching and Rash   Family History  Problem Relation Age of Onset  . Diabetes Mother   . Alzheimer's disease Mother   . Diabetes Father   . Lung cancer Father        Age 58  . Stroke Father   . Heart attack Father   . Lung cancer Sister        lung  . Heart disease Maternal Grandfather   . COPD Sister   . Heart attack Sister   . Heart disease Sister     PE: BP 124/70 (BP Location: Left Arm, Patient Position: Sitting, Cuff Size: Normal)   Pulse 78   Ht 5' 11.5" (1.816 m)   Wt 202 lb 6.4 oz (91.8 kg)   SpO2 91%   BMI 27.84 kg/m  Body mass index is 27.84 kg/m. Wt Readings from Last 3 Encounters:  05/14/19 202 lb 6.4 oz (91.8 kg)  04/18/19 196 lb 8 oz (89.1 kg)  04/07/19 205 lb 7.5 oz (93.2 kg)   Constitutional:  in NAD  The  physical exam was not performed (telephone visit).  ASSESSMENT: 1. DM2, insulin-dependent, uncontrolled, with complications - noncompliance with visits, insulin doses, CBG checks - CKD stage 3 - PN - ED - TIA  2. HTG  3.  Overweight  PLAN:  1. Patient with history of longstanding, uncontrolled, type 2 diabetes, on basal-bolus insulin regimen with Humulin N and R not due to price.  He continues to miss many insulin injections as she forgets to him.  He has Alzheimer dementia.  He also cannot check frequent blood sugars due to neuropathy in fingers.  The CGM was not covered in the past. -At last visit, sugars are better after starting Ozempic and also due to a recent episode of GI upset.  He was eating much less compared to before.  We did not change the regimen at that time. -Since then, he had an HbA1c at the end of 03/2019 and this was even higher than  before, at 12%.  He continues to miss insulin doses, which limits our ability to control his diabetes, but he tells me that he is not missing as many.  At this visit, he tells me that for the last 6 months of the last year he was off Ozempic, after he got into the donut hole.  Unfortunately, he did not let me know about this and his HbA1c was extremely high when it was checked by PCP in 03/2019.  I advised him to let me know if this happens again to adjust his insulin doses.  For now, he was able to restart Ozempic at the beginning of this month and sugars have improved, but they are still high.  Therefore, we will increase his NPH and also regular insulin with larger meals. -I am reticent to increase his Ozempic due to recent weight loss - I advised him to: Patient Instructions  Please continue: -Ozempic 0.5 mg weekly -Also: Type of insulin Breakfast Lunch Dinner  Humulin R 20 - 20  Humulin N 20 - 20   Please return in 3-4 months with your sugar log.   - advised to check sugars at different times of the day - 3x a day, rotating check  times - advised for yearly eye exams >> he is UTD - return to clinic in 3-4 months  2. HTG -Reviewed latest lipid panel from 12/2018: Triglycerides still high, LDL above target of less than 70, HDL low Lab Results  Component Value Date   CHOL 172 01/03/2019   HDL 31.40 (L) 01/03/2019   LDLCALC 43 04/24/2009   LDLDIRECT 82.0 01/03/2019   TRIG (H) 01/03/2019    521.0 Triglyceride is over 400; calculations on Lipids are invalid.   CHOLHDL 5 01/03/2019  -Continues the statin without side effects -Discussed repeatedly in the past about cutting down fatty foods and sugary snacks  3.  Overweight - continue Ozempic which should also help with weight loss -he lost 22 pounds in the last year!  Philemon Kingdom, MD PhD [facility] Endocrinology

## 2019-05-14 NOTE — Progress Notes (Addendum)
[name], [name] ([id number]) Visit Report for 05/14/2019 Chief Complaint Document Details Patient Name: [name], [name] Date of Service: 05/14/2019 10:30 AM Medical Record Number: [id number] Patient Account Number: [id number] Date of Birth/Sex: 1946-03-25 (74 y.o. M) Treating RN: Cornell Barman Primary Care Provider: [name] Other Clinician: Referring Provider: [name] Treating Provider/Extender: Melburn Hake, Rory Xiang Weeks in Treatment: 13 Information Obtained from: Patient Chief Complaint Left dorsal foot ulcer 02/09/2019; patient is here for review of the wound on the left lateral foot roughly at the level of the base of the fifth metatarsal Electronic Signature(s) Signed: 05/14/2019 10:49:09 AM By: Worthy Keeler [name] Entered By: Worthy Keeler on 05/14/2019 10:49:09 [name] ([id number]) -------------------------------------------------------------------------------- Debridement Details Patient Name: [name] Date of Service: 05/14/2019 10:30 AM Medical Record Number: [id number] Patient Account Number: [id number] Date of Birth/Sex: [birth year] (74 y.o. M) Treating RN: Cornell Barman Primary Care Provider: [name] Other Clinician: Referring Provider: [name] Treating Provider/Extender: Melburn Hake, Tonia Avino Weeks in Treatment: 13 Debridement Performed for Wound #5 Left,Lateral Foot Assessment: Performed By: Physician [name], Macauley Mossberg E., [name] Debridement Type: Debridement Severity of Tissue Pre Fat layer exposed Debridement: Level of Consciousness (Pre- Awake and Alert procedure): Pre-procedure Verification/Time Yes - 10:52 Out Taken: Start Time: 10:52 Pain Control: Lidocaine Total Area Debrided (L x W): 0.4 (cm) x 1.6 (cm) = 0.64 (cm) Tissue and other material Viable, Non-Viable, Callus, Skin: Dermis debrided: Level: Skin/Dermis Debridement Description: Selective/Open Wound Instrument: Scissors Bleeding: Minimum Hemostasis Achieved:  Pressure End Time: 10:55 Response to Treatment: Procedure was tolerated well Level of Consciousness Awake and Alert (Post-procedure): Post Debridement Measurements of Total Wound Length: (cm) 0.4 Width: (cm) 1.6 Depth: (cm) 0.2 Volume: (cm) 0.101 Character of Wound/Ulcer Post Debridement: Stable Severity of Tissue Post Debridement: Limited to breakdown of skin Post Procedure Diagnosis Same as Pre-procedure Electronic Signature(s) Signed: 05/14/2019 4:06:16 PM By: Worthy Keeler [name] Signed: 05/17/2019 5:22:08 PM By: Gretta Cool, BSN, RN, CWS, Kim RN, BSN Entered By: Gretta Cool, BSN, RN, CWS, Kim on 05/14/2019 10:54:51 [name] ([id number]) -------------------------------------------------------------------------------- HPI Details Patient Name: [name] Date of Service: 05/14/2019 10:30 AM Medical Record Number: [id number] Patient Account Number: [id number] Date of Birth/Sex: 06/28/1945 (74 y.o. M) Treating RN: Cornell Barman Primary Care Provider: [name] Other Clinician: Referring Provider: [name] Treating Provider/Extender: Melburn Hake, Sean Macwilliams Weeks in Treatment: 13 History of Present Illness HPI Description: 10/04/16 on evaluation today patient presents for initial evaluation concerning a laceration of the right wrist and hand on the bowler aspect. He tells me that he fell into glass following a medication change in regard to his diabetes. He initially went to [facility] where she was sutured and subsequently his daughter who is a Marine scientist removed the teachers at the appropriate time. Unfortunately the wound has done poorly and dehissed which has caused him some trouble including infection. He has been on antibiotics both topically and orally though the wound continues to show signs of necrotic tissue according to notes which is what he was sent to [place] for evaluation concerning. He does have diabetes and unfortunately this is severely uncontrolled. He does see an  endocrinologist and is working on this. 10/11/2016 -- the patient's notes from the ER were reviewed and he had his suturing done on 09/17/2016 and had necrotic skin flaps which had to be trimmed the last visit he was here. He has managed to get his Santyl ointment and has been using this appropriately. 10/21/16 patient's wound appears to  be doing very well on evaluation today. He still has some Slough covering the wound bed but this is nowhere near as severe as when i saw this initially two weeks ago nor even my review of his pictures last week. I'm pleased with how this is progressing. Patient's wound over the right form appears to be doing fairly well on evaluation today. There is no evidence of infection and he has a small area at the crease of where she is wrist extends and flexes that is still open and appears to be having difficulty due to the fact that it is cracking open. I think this is something that may be controlled with moisture control that is keeping the wound a little bit more moist. Nonetheless this is a tiny region compared to the initial wound and overall appears to be doing very well. No fevers, chills, nausea, or vomiting noted at this time. 11/25/16 on evaluation today patient appears to be doing well in regard to his right wrist ulcer. In fact this appears to be completely healed he is having no discomfort. He is pleased with how this is progressed. Readmission: 06/24/17 on evaluation today patient appears back in our clinic regarding issues that he has been having with his right ankle on the lateral malleolus as well is in the dorsal surface of his left foot. Both have been going on for several weeks unfortunately. He tells me that initially this was being managed by Dr. Edilia Bo and I did review the notes from Dr. Edilia Bo in epic. Patient was placed on amoxicillin initially and then subsequently Keflex following. He has been using triple antibiotic ointment over-the-counter  along with a Band-Aid for the wound currently. He did not have any Santyl remaining. His most recent hemoglobin A1c which was just two days ago was 10.3. Fortunately he seems to have excellent blood flow in the bilateral lower extremities with his left ABI being 0.95 in the right ABI 1.01. Patient does have some pain in regard to each of these ulcerations but he tells me it's not nearly as severe as the hand wound that I help treat earlier over the summer. This was in 2018. 07/15/17 on evaluation today patient's lower surety ulcer is actually both appear to be doing fairly well at this point. He does have a little bit of skin overlapping the ulcer on the right lateral ankle this was debrided away today just with saline and gauze without complication patient had no discomfort or pain. Otherwise patient's left foot ulcer does appear to be showing signs of improvement he does seem to have a right great toe. Nikki and noted at this point in time. I think that this does need to be managed at this point. No fevers, chills, nausea, or vomiting noted at this time. 07/22/17 on evaluation today patient's wounds in regard to his lower extremities appear to be doing somewhat better. His right ankle wound in fact appears to likely be completely closed. The left dorsal foot ulcer though still open does appear to be a little bit smaller and does also seem to be making good progress. He has not been having problems with the treatment at this point in the general he does not seem to show as much erythema surrounding the wound bed which is good news. He is not MALON, BRANTON E. ([id number]) having significant discomfort. 08/12/17 on evaluation today patients wounds of both appeared to be doing better in fact they both appear to be completely healed. Overall I'm very  pleased with how things have progressed over the past week even more than what I expected. Obviously this is good news and he is happy about this. He did have  a fall when going to his orthopedic doctor recently fortunately he does not appear to have injured anything severely but nonetheless does have a couple of bumps and bruises nothing significant at this time but he needs to be seen and wound care for. He may have broken his left wrist he is following up with orthopedics in that regard. Readmission: 10/24/17 on evaluation today patient presents for reevaluation after having been discharged Aug 12, 2017. His wound was healed at that point although he states that it has been somewhat discolored since that time. He has not experienced the drainage as far as the wound is concerned but the fact that it was still discolored been greater than two months after the fact made him want to come in for reevaluation. Upon inspection today the patient has no pain although he does have neuropathy. He does have a discolored region of the dorsal surface of the left foot which was the site where the ulcer was that we previously treated. The good news is he does not again have any discomfort although obviously I do believe this may be some scar tissue and just discoloration in that regard. Again he hasn't had any drainage or discharge since I last saw him and at this point I see no evidence of that either this area shows complete epithelialization. In general I do not feel like there's any significant issue at this point in that regard. 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 He has been in this clinic before in 2018 with a laceration on his right hand and then again in 2019 with an area over the right lateral malleolus. 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 He states that the wound is been there for the last week or 2. It is been uncomfortable to walk on. The man is an active man having to look  after an elderly mother. Past medical history, type 2 diabetes with polyneuropathy with a recent hemoglobin A1c in September 2011 0.3, lumbar disc disease, hypergonadism., Left foot drop and gout. We did not repeat his ABIs in the clinic quoting values from July 2019 of 0.95 and 1.01. He is not felt to have significant PAD 02/19/2019 on evaluation today patient actually appears to be doing better with regard to his foot wound based on what I am seeing currently. He was seen by Dr. Dellia Nims on readmission back into the clinic last week when I was off. The good news is the patient has been tolerating the dressing changes without any complication in fact has not really had much in the way of drainage. He does have an AFO brace which she believes is actually pushing on this area as well and has contributed to the issue. Fortunately there is no signs of active infection at this point. His ABIs appear to be well. 03/05/2019 on evaluation today patient appears to be doing well in regard to his foot ulcer. He has been tolerating the dressing changes without complication. Fortunately there is no signs of active infection. The wound is measuring somewhat smaller and overall I feel like he is making good progress at this point. 03/12/2019 on evaluation today patient  actually appears to be doing quite well with regard to his foot ulcer. The wound is looking better the measurement is not significantly better at this point unfortunately but nonetheless again the overall appearance is improving I think he is making some progress. I still think the big issue here is foot drop and again the patient wants to see if potentially he could get a brace to help with this. Subsequently I think that we can definitely make a referral to [facility] to see if they can help [place] in this regard. He is in agreement with that plan. 03/26/2019 on evaluation today patient appears to be doing more poorly in regard to his foot ulcer at this  time. He did see podiatry in order to see about getting a brace but unfortunately he tells me that Medicare is not likely to pay for one for him as he had 1 I guess 8 months ago. However this one that apparently goes in his shoes that does not seem to be working out well for him. Unfortunately there are signs of active infection at this time. No fevers, chills, nausea, vomiting, or diarrhea. Systemically. The patient has erythema on the dorsal surface of his foot that tracks from the lateral portion of his foot and again he does have a blister around the wound as well which is worse compared to previous. Overall I am concerned. 04/02/2019 on evaluation today patient appears to be doing about the same with regard to his wound. Unfortunately there is no signs of significant improvement although I do think there is no signs of infection which is good news. His drainage is also [name], Dustin E. ([id number]) slowed down tremendously. I think at this point he would benefit from the initiation of a total contact cast. The only concern I have is that over his balance. I explained to the patient that if we were to initiate the cast I think it would be greatly beneficial for him but he would need to ensure not to walk at all unless he uses a walker he actually has 2 walkers one for inside his home and one that he keeps out in the car. For that reason he would be covered in that regard. I just do not want him to have any accidents and fall with the cast. 12/28-Patient comes in 1 week out a day early for his appointment he was in the hospital for 2 days for uncontrolled hyperglycemia and required management of high blood sugars, with some adjustments in his home regimen of insulin, patient stubbed his right great toe today and put a Band-Aid on it, he is here for his left lateral foot ulcer and we are using silver alginate dressing, he was intact total contact cast with difficulty with balance to the extent  that that had to be discontinued 04/16/2019 on evaluation today patient appears to be doing better with regard to the overall appearance of his wound size. This is good news. We had attempted a total contact cast but unfortunately he had some falling issues that the family contacted [place] about we took the cast off we did not put it back on. Nonetheless it ended up that just a couple days later he was in the hospital due to elevations in his blood sugar which went to around 500 and even a little higher. Subsequently he was admitted to the hospital and was in the hospital for a total of 3 days and that included Christmas day. Fortunately there got his  blood sugar under control he tells me that this is running about 200 now which is much better news. There does not appear to be any signs of active infection at this time. No fevers, chills, nausea, vomiting, or diarrhea. 04/30/2019 on evaluation today patient actually appears to be doing slightly worse compared to previous as far as the overall size of his wound is concerned. There also is some need for sharp debridement today as well. Fortunately there is no evidence of active infection which is good news. No fevers, chills, nausea, vomiting, or diarrhea. 05/07/2019 upon evaluation today patient appears to be doing excellent in regard to his plantar foot ulcer. He has been tolerating the dressing changes without complication. Fortunately there is no signs of active infection at this time. No fevers, chills, nausea, vomiting, or diarrhea. He does tell me that he actually got in touch with [employer] and they feel like they can add diagnosis codes to his orders in order to get an external brace for him as opposed to the one that goes internal to issue. That would be excellent for his foot drop and I did tell him that if there is anything I need to fill out in that regard I will be more than happy to oblige and fill out what is needed for [employer]. 05/14/2019 upon  evaluation today patient's original wound site actually appears to be doing much better which is good news. Fortunately there is no signs of active infection at this time. No fevers, chills, nausea, vomiting, or diarrhea. With that being said the patient unfortunately is continuing to have issues with rubbing he tells me that the felt slid on his foot although he did not realize it until today. Nonetheless I think this did cause a blister around the edge of the wound and he does an open wound that is slightly larger overall in measurement compared to last time mainly due to this blister. Other than that I feel like he is doing quite well. Electronic Signature(s) Signed: 05/14/2019 12:48:14 PM By: Worthy Keeler [name] Entered By: Worthy Keeler on 05/14/2019 12:48:14 [name] ([id number]) -------------------------------------------------------------------------------- Physical Exam Details Patient Name: [name] Date of Service: 05/14/2019 10:30 AM Medical Record Number: [id number] Patient Account Number: [id number] Date of Birth/Sex: 16-Sep-1945 (74 y.o. M) Treating RN: Cornell Barman Primary Care Provider: [name] Other Clinician: Referring Provider: [name] Treating Provider/Extender: [name], Glen Blatchley Weeks in Treatment: 18 Constitutional Well-nourished and well-hydrated in no acute distress. Respiratory normal breathing without difficulty. Psychiatric this patient is able to make decisions and demonstrates good insight into disease process. Alert and Oriented x 3. pleasant and cooperative. Notes Patient's wound did require sharp debridement to remove skin and callus tissue from the surface of the blister area post debridement the wound bed appears to be doing fairly well is very superficial with a lot of epithelial tissue already noted. Hopefully this will continue to show signs of good improvement. Electronic Signature(s) Signed: 05/14/2019 12:48:30 PM By:  Worthy Keeler [name] Entered By: Worthy Keeler on 05/14/2019 12:48:30 [name] ([id number]) -------------------------------------------------------------------------------- Physician Orders Details Patient Name: [name] Date of Service: 05/14/2019 10:30 AM Medical Record Number: [id number] Patient Account Number: [id number] Date of Birth/Sex: 24-Dec-1945 (74 y.o. M) Treating RN: Cornell Barman Primary Care Provider: [name] Other Clinician: Referring Provider: [name] Treating Provider/Extender: Melburn Hake, Tarisha Fader Weeks in Treatment: 13 Verbal / Phone Orders: No Diagnosis Coding ICD-10 Coding Code Description E11.621 Type 2 diabetes  mellitus with foot ulcer E11.42 Type 2 diabetes mellitus with diabetic polyneuropathy L97.522 Non-pressure chronic ulcer of other part of left foot with fat layer exposed M21.372 Foot drop, left foot Wound Cleansing Wound #5 Left,Lateral Foot o May shower with protection. Anesthetic (add to Medication List) Wound #5 Left,Lateral Foot o Topical Lidocaine 4% cream applied to wound bed prior to debridement (In Clinic Only). Primary Wound Dressing Wound #5 Left,Lateral Foot o Silver Collagen Secondary Dressing Wound #5 Left,Lateral Foot o Conform/Kerlix - Rolled gauze o Other - Felt to offload Follow-up Appointments Wound #5 Left,Lateral Foot o Return Appointment in 1 week. Off-Loading o Turn and reposition every 2 hours - Elevate foot when sitting Additional Orders / Instructions Wound #5 Left,Lateral Foot o Other: - Patient to follow-up with [employer] for adjustments to braces and shoes. Electronic Signature(s) Signed: 05/14/2019 4:06:16 PM By: Worthy Keeler [name] Signed: 05/17/2019 5:22:08 PM By: Gretta Cool, BSN, RN, CWS, Kim RN, BSN Entered By: Gretta Cool, BSN, RN, CWS, Kim on 05/14/2019 10:55:16 [name], [name] ([id number]) [name], [name]  ([id number]) -------------------------------------------------------------------------------- Problem List Details Patient Name: [name] Date of Service: 05/14/2019 10:30 AM Medical Record Number: [id number] Patient Account Number: [id number] Date of Birth/Sex: 02/25/1946 (74 y.o. M) Treating RN: Cornell Barman Primary Care Provider: [name] Other Clinician: Referring Provider: [name] Treating Provider/Extender: Worthy Keeler Weeks in Treatment: 13 Active Problems ICD-10 Evaluated Encounter Code Description Active Date Today Diagnosis E11.621 Type 2 diabetes mellitus with foot ulcer 02/09/2019 No Yes E11.42 Type 2 diabetes mellitus with diabetic polyneuropathy 02/09/2019 No Yes L97.522 Non-pressure chronic ulcer of other part of left foot with fat 02/09/2019 No Yes layer exposed M21.372 Foot drop, left foot 02/09/2019 No Yes Inactive Problems Resolved Problems Electronic Signature(s) Signed: 05/14/2019 10:49:03 AM By: Worthy Keeler [name] Entered By: Worthy Keeler on 05/14/2019 10:49:02 [name] ([id number]) -------------------------------------------------------------------------------- Progress Note Details Patient Name: [name] Date of Service: 05/14/2019 10:30 AM Medical Record Number: [id number] Patient Account Number: [id number] Date of Birth/Sex: 1946/02/04 (74 y.o. M) Treating RN: Cornell Barman Primary Care Provider: [name] Other Clinician: Referring Provider: [name] Treating Provider/Extender: Sharalyn Ink in Treatment: 13 Subjective Chief Complaint Information obtained from Patient Left dorsal foot ulcer 02/09/2019; patient is here for review of the wound on the left lateral foot roughly at the level of the base of the fifth metatarsal History of Present Illness (HPI) 10/04/16 on evaluation today patient presents for initial evaluation concerning a laceration of the right wrist and hand on the bowler  aspect. He tells me that he fell into glass following a medication change in regard to his diabetes. He initially went to [facility] where she was sutured and subsequently his daughter who is a Marine scientist removed the teachers at the appropriate time. Unfortunately the wound has done poorly and dehissed which has caused him some trouble including infection. He has been on antibiotics both topically and orally though the wound continues to show signs of necrotic tissue according to notes which is what he was sent to [place] for evaluation concerning. He does have diabetes and unfortunately this is severely uncontrolled. He does see an endocrinologist and is working on this. 10/11/2016 -- the patient's notes from the ER were reviewed and he had his suturing done on 09/17/2016 and had necrotic skin flaps which had to be trimmed the last visit he was here. He has managed to get his Santyl ointment and has been using this appropriately. 10/21/16  patient's wound appears to be doing very well on evaluation today. He still has some Slough covering the wound bed but this is nowhere near as severe as when i saw this initially two weeks ago nor even my review of his pictures last week. I'm pleased with how this is progressing. Patient's wound over the right form appears to be doing fairly well on evaluation today. There is no evidence of infection and he has a small area at the crease of where she is wrist extends and flexes that is still open and appears to be having difficulty due to the fact that it is cracking open. I think this is something that may be controlled with moisture control that is keeping the wound a little bit more moist. Nonetheless this is a tiny region compared to the initial wound and overall appears to be doing very well. No fevers, chills, nausea, or vomiting noted at this time. 11/25/16 on evaluation today patient appears to be doing well in regard to his right wrist ulcer. In fact this appears  to be completely healed he is having no discomfort. He is pleased with how this is progressed. Readmission: 06/24/17 on evaluation today patient appears back in our clinic regarding issues that he has been having with his right ankle on the lateral malleolus as well is in the dorsal surface of his left foot. Both have been going on for several weeks unfortunately. He tells me that initially this was being managed by Dr. Edilia Bo and I did review the notes from Dr. Edilia Bo in epic. Patient was placed on amoxicillin initially and then subsequently Keflex following. He has been using triple antibiotic ointment over-the-counter along with a Band-Aid for the wound currently. He did not have any Santyl remaining. His most recent hemoglobin A1c which was just two days ago was 10.3. Fortunately he seems to have excellent blood flow in the bilateral lower extremities with his left ABI being 0.95 in the right ABI 1.01. Patient does have some pain in regard to each of these ulcerations but he tells me it's not nearly as severe as the hand wound that I help treat earlier over the summer. This was in 2018. 07/15/17 on evaluation today patient's lower surety ulcer is actually both appear to be doing fairly well at this point. He does have a little bit of skin overlapping the ulcer on the right lateral ankle this was debrided away today just with saline and gauze without complication patient had no discomfort or pain. Otherwise patient's left foot ulcer does appear to be showing signs of [name], PULLIN. ([id number]) improvement he does seem to have a right great toe. Nikki and noted at this point in time. I think that this does need to be managed at this point. No fevers, chills, nausea, or vomiting noted at this time. 07/22/17 on evaluation today patient's wounds in regard to his lower extremities appear to be doing somewhat better. His right ankle wound in fact appears to likely be completely closed. The left  dorsal foot ulcer though still open does appear to be a little bit smaller and does also seem to be making good progress. He has not been having problems with the treatment at this point in the general he does not seem to show as much erythema surrounding the wound bed which is good news. He is not having significant discomfort. 08/12/17 on evaluation today patients wounds of both appeared to be doing better in fact they both appear to be  completely healed. Overall I'm very pleased with how things have progressed over the past week even more than what I expected. Obviously this is good news and he is happy about this. He did have a fall when going to his orthopedic doctor recently fortunately he does not appear to have injured anything severely but nonetheless does have a couple of bumps and bruises nothing significant at this time but he needs to be seen and wound care for. He may have broken his left wrist he is following up with orthopedics in that regard. Readmission: 10/24/17 on evaluation today patient presents for reevaluation after having been discharged Aug 12, 2017. His wound was healed at that point although he states that it has been somewhat discolored since that time. He has not experienced the drainage as far as the wound is concerned but the fact that it was still discolored been greater than two months after the fact made him want to come in for reevaluation. Upon inspection today the patient has no pain although he does have neuropathy. He does have a discolored region of the dorsal surface of the left foot which was the site where the ulcer was that we previously treated. The good news is he does not again have any discomfort although obviously I do believe this may be some scar tissue and just discoloration in that regard. Again he hasn't had any drainage or discharge since I last saw him and at this point I see no evidence of that either this area shows complete epithelialization.  In general I do not feel like there's any significant issue at this point in that regard. 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 He has been in this clinic before in 2018 with a laceration on his right hand and then again in 2019 with an area over the right lateral malleolus. 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 He states that the wound is been there for the last week or 2. It is been uncomfortable to walk on. The man is an active man having to look after an elderly mother. Past medical history, type 2 diabetes with polyneuropathy with a recent hemoglobin A1c in September 2011 0.3, lumbar disc disease, hypergonadism., Left foot drop and gout. We did not repeat his ABIs in the clinic quoting values from July 2019 of 0.95 and 1.01. He is not felt to have significant PAD 02/19/2019 on evaluation today patient actually appears to be doing better with regard to his foot wound based on what I am seeing currently. He was seen by Dr. Dellia Nims on readmission back into the clinic last week when I was off. The good news is the patient has been tolerating the dressing changes without any complication in fact has not really had much in the way of drainage. He does have an AFO brace which she believes is actually pushing on this area as well and has contributed to the issue. Fortunately there is no signs of active infection at this point. His ABIs appear to be well. 03/05/2019 on evaluation today patient appears to be doing well in regard to his foot ulcer. He has been tolerating the dressing changes without complication. Fortunately there is no signs of active infection. The wound is measuring somewhat smaller and overall I feel like he is making good progress at this point. 03/12/2019  on evaluation today patient actually appears to be  doing quite well with regard to his foot ulcer. The wound is looking better the measurement is not significantly better at this point unfortunately but nonetheless again the overall appearance is improving I think he is making some progress. I still think the big issue here is foot drop and again the patient wants to see if potentially he could get a brace to help with this. Subsequently I think that we can definitely make a referral to [facility] to see if they can help [place] in this regard. He is in agreement with that plan. 03/26/2019 on evaluation today patient appears to be doing more poorly in regard to his foot ulcer at this time. He did see podiatry in order to see about getting a brace but unfortunately he tells me that Medicare is not likely to pay for one for him as [name], ALCORN. ([id number]) he had 1 I guess 8 months ago. However this one that apparently goes in his shoes that does not seem to be working out well for him. Unfortunately there are signs of active infection at this time. No fevers, chills, nausea, vomiting, or diarrhea. Systemically. The patient has erythema on the dorsal surface of his foot that tracks from the lateral portion of his foot and again he does have a blister around the wound as well which is worse compared to previous. Overall I am concerned. 04/02/2019 on evaluation today patient appears to be doing about the same with regard to his wound. Unfortunately there is no signs of significant improvement although I do think there is no signs of infection which is good news. His drainage is also slowed down tremendously. I think at this point he would benefit from the initiation of a total contact cast. The only concern I have is that over his balance. I explained to the patient that if we were to initiate the cast I think it would be greatly beneficial for him but he would need to ensure not to walk at all unless he uses a walker he actually has 2 walkers one for  inside his home and one that he keeps out in the car. For that reason he would be covered in that regard. I just do not want him to have any accidents and fall with the cast. 12/28-Patient comes in 1 week out a day early for his appointment he was in the hospital for 2 days for uncontrolled hyperglycemia and required management of high blood sugars, with some adjustments in his home regimen of insulin, patient stubbed his right great toe today and put a Band-Aid on it, he is here for his left lateral foot ulcer and we are using silver alginate dressing, he was intact total contact cast with difficulty with balance to the extent that that had to be discontinued 04/16/2019 on evaluation today patient appears to be doing better with regard to the overall appearance of his wound size. This is good news. We had attempted a total contact cast but unfortunately he had some falling issues that the family contacted [place] about we took the cast off we did not put it back on. Nonetheless it ended up that just a couple days later he was in the hospital due to elevations in his blood sugar which went to around 500 and even a little higher. Subsequently he was admitted to the hospital and was in the hospital for a total of 3 days and that included Christmas day.  Fortunately there got his blood sugar under control he tells me that this is running about 200 now which is much better news. There does not appear to be any signs of active infection at this time. No fevers, chills, nausea, vomiting, or diarrhea. 04/30/2019 on evaluation today patient actually appears to be doing slightly worse compared to previous as far as the overall size of his wound is concerned. There also is some need for sharp debridement today as well. Fortunately there is no evidence of active infection which is good news. No fevers, chills, nausea, vomiting, or diarrhea. 05/07/2019 upon evaluation today patient appears to be doing excellent in regard  to his plantar foot ulcer. He has been tolerating the dressing changes without complication. Fortunately there is no signs of active infection at this time. No fevers, chills, nausea, vomiting, or diarrhea. He does tell me that he actually got in touch with [employer] and they feel like they can add diagnosis codes to his orders in order to get an external brace for him as opposed to the one that goes internal to issue. That would be excellent for his foot drop and I did tell him that if there is anything I need to fill out in that regard I will be more than happy to oblige and fill out what is needed for [employer]. 05/14/2019 upon evaluation today patient's original wound site actually appears to be doing much better which is good news. Fortunately there is no signs of active infection at this time. No fevers, chills, nausea, vomiting, or diarrhea. With that being said the patient unfortunately is continuing to have issues with rubbing he tells me that the felt slid on his foot although he did not realize it until today. Nonetheless I think this did cause a blister around the edge of the wound and he does an open wound that is slightly larger overall in measurement compared to last time mainly due to this blister. Other than that I feel like he is doing quite well. Objective Constitutional Well-nourished and well-hydrated in no acute distress. Vitals Time Taken: 10:40 AM, Height: 71 in, Weight: 205 lbs, BMI: 28.6, Temperature: 97.6 F, Pulse: 103 bpm, Respiratory Rate: 16 breaths/min, Blood Pressure: 153/81 mmHg. [name], Dustin E. ([id number]) Respiratory normal breathing without difficulty. Psychiatric this patient is able to make decisions and demonstrates good insight into disease process. Alert and Oriented x 3. pleasant and cooperative. General Notes: Patient's wound did require sharp debridement to remove skin and callus tissue from the surface of the blister area post debridement the wound  bed appears to be doing fairly well is very superficial with a lot of epithelial tissue already noted. Hopefully this will continue to show signs of good improvement. Integumentary (Hair, Skin) Wound #5 status is Open. Original cause of wound was Pressure Injury. The wound is located on the Left,Lateral Foot. The wound measures 0.4cm length x 1.6cm width x 0.1cm depth; 0.503cm^2 area and 0.05cm^3 volume. There is Fat Layer (Subcutaneous Tissue) Exposed exposed. There is no tunneling or undermining noted. There is a medium amount of serosanguineous drainage noted. The wound margin is flat and intact. There is large (67-100%) pink granulation within the wound bed. There is a small (1-33%) amount of necrotic tissue within the wound bed including Adherent Slough. 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 There was a Selective/Open Wound Skin/Dermis Debridement with a total area of 0.64 sq cm performed by [name], Jaymie Misch E., [name]. With the following instrument(s): Scissors to remove Viable and Non-Viable tissue/material. Material removed includes Callus and Skin: Dermis and after achieving pain control using Lidocaine. No specimens were taken. A time out was conducted at 10:52, prior to the start of the procedure. A Minimum amount of bleeding was controlled with Pressure. The procedure was tolerated well. Post Debridement Measurements: 0.4cm length x 1.6cm width x 0.2cm depth; 0.101cm^3 volume. Character of Wound/Ulcer Post Debridement is stable. Severity of Tissue Post Debridement is: Limited to breakdown of skin. Post  procedure Diagnosis Wound #5: Same as Pre-Procedure Plan [name], [name] ([id number]) Wound Cleansing: Wound #5 Left,Lateral Foot: May shower with protection. Anesthetic (add to Medication List): Wound #5 Left,Lateral Foot: Topical Lidocaine 4% cream applied to wound bed prior to debridement (In Clinic Only). Primary Wound Dressing: Wound #5 Left,Lateral Foot: Silver Collagen Secondary Dressing: Wound #5 Left,Lateral Foot: Conform/Kerlix - Rolled gauze Other - Felt to offload Follow-up Appointments: Wound #5 Left,Lateral Foot: Return Appointment in 1 week. Off-Loading: Turn and reposition every 2 hours - Elevate foot when sitting Additional Orders / Instructions: Wound #5 Left,Lateral Foot: Other: - Patient to follow-up with [employer] for adjustments to braces and shoes. 1. I would recommend that we continue with the felt although I do want him to check positioning daily and in fact really twice a day to make sure that nothing is sliding or movement on him. I think that is what caused the blistering here other than that he has made more progress in the past 3 weeks with his wound and I am seeing him since we began taking care of him. 2. I am also going to suggest at this point that we continue with the silver collagen dressing which I feel like is doing well for him otherwise. 3. He still in talks with [employer] as far as getting an AFO brace for the outside of his shoe to prevent anything from rubbing on the inside and help with his foot drop that still in progress at this point he tells me. We will see patient back for reevaluation in 1 week here in the clinic. If anything worsens or changes patient will contact our office for additional recommendations. The patient did bring back a letter from [employer] dated May 11, 2019 which I did review. The question here for me was whether or not I could document why the patient is going to require a second AFO brace within a 5-year period.  The rationale behind why the patient needs this is actually he has a foot wound on his foot that occurred secondary to the AFO being inside his shoe which is how his current one functions. This caused an area of rubbing which subsequently opened up the wound that we have been treating since February 09, 2019. We attempted several offloading shoes in order to try to help but unfortunately secondary to the foot drop this was not possible. He is not even been able to use his AFO due to the wound and the fact that again it obviously would make things worse. We are finally getting to the point where the wound is looking better and does seem to be making progress. Nonetheless I think going forward  he is getting need a AFO that actually is external to issue and allows him not to have to have anything extra inside the shoe due to his diabetic neuropathy and propensity to developing diabetic foot ulcers. Obviously this could lead to amputation which would be a significant poor outcome for the patient. What he needs is a double upright AFO which again will attach externally into his shoe so that it will not cause any additional rubbing or friction nor pressure to his feet. This will also allow him to subsequently ambulate appropriately and prevent abnormal friction forces that lead to developing diabetic foot ulcers as well. Obviously it also is a benefit in helping him not to fall secondary to the foot drop. I do believe this is good to be crucial to the patient's healing as well as maintaining healing once were able to get everything closed. If there are any questions please contact my office at phone [phone]. Electronic Signature(s) Signed: 05/14/2019 2:19:18 PM By: Suan Halter ([id number]) Previous Signature: 05/14/2019 12:52:20 PM Version By: Worthy Keeler [name] Entered By: Worthy Keeler on 05/14/2019 14:19:18 [name]  ([id number]) -------------------------------------------------------------------------------- SuperBill Details Patient Name: [name] Date of Service: 05/14/2019 Medical Record Number: [id number] Patient Account Number: [id number] Date of Birth/Sex: 1946-03-15 (74 y.o. M) Treating RN: Cornell Barman Primary Care Provider: [name] Other Clinician: Referring Provider: [name] Treating Provider/Extender: Melburn Hake, Boubacar Lerette Weeks in Treatment: 13 Diagnosis Coding ICD-10 Codes Code Description E11.621 Type 2 diabetes mellitus with foot ulcer E11.42 Type 2 diabetes mellitus with diabetic polyneuropathy L97.522 Non-pressure chronic ulcer of other part of left foot with fat layer exposed M21.372 Foot drop, left foot Facility Procedures CPT4 Code: [id number] Description: [phone] - DEBRIDE WOUND 1ST 20 SQ CM OR < ICD-10 Diagnosis Description L97.522 Non-pressure chronic ulcer of other part of left foot with fat Modifier: layer exposed Quantity: 1 Physician Procedures CPT4 Code: 8828003 Description: 49179 - WC PHYS DEBR WO ANESTH 20 SQ CM ICD-10 Diagnosis Description L97.522 Non-pressure chronic ulcer of other part of left foot with fat Modifier: layer exposed Quantity: 1 Electronic Signature(s) Signed: 05/14/2019 12:52:52 PM By: Worthy Keeler [name] Entered By: Worthy Keeler on 05/14/2019 12:52:51

## 2019-05-16 ENCOUNTER — Ambulatory Visit (INDEPENDENT_AMBULATORY_CARE_PROVIDER_SITE_OTHER): Payer: Medicare Other | Admitting: *Deleted

## 2019-05-16 ENCOUNTER — Other Ambulatory Visit: Payer: Self-pay

## 2019-05-16 DIAGNOSIS — E291 Testicular hypofunction: Secondary | ICD-10-CM

## 2019-05-16 NOTE — Progress Notes (Signed)
Per orders of Dr. Lorelei Pont, injection of Testosterone 150mg  given by Virl Cagey. Patient tolerated injection well.

## 2019-05-17 NOTE — Progress Notes (Signed)
CAZ, WEAVER ([id number]) Visit Report for 05/14/2019 Arrival Information Details Patient Name: [name], [name] Date of Service: 05/14/2019 10:30 AM Medical Record Number: [id number] Patient Account Number: [id number] Date of Birth/Sex: [birth year] (73 y.o. M) Treating RN: Cornell Barman Primary Care Chennel Olivos: [name] Other Clinician: Referring Eryanna Regal: [name] Treating Taheera Thomann/Extender: Melburn Hake, HOYT Weeks in Treatment: 13 Visit Information History Since Last Visit Added or deleted any medications: No Patient Arrived: Cane Any new allergies or adverse reactions: No Arrival Time: 10:41 Had a fall or experienced change in No Accompanied By: self activities of daily living that may affect Transfer Assistance: None risk of falls: Patient Identification Verified: Yes Signs or symptoms of abuse/neglect since last visito No Secondary Verification Process Completed: Yes Hospitalized since last visit: No Patient Has Alerts: Yes Implantable device outside of the clinic excluding No Patient Alerts: DMII cellular tissue based products placed in the center since last visit: Has Dressing in Place as Prescribed: Yes Pain Present Now: No Electronic Signature(s) Signed: 05/14/2019 3:33:21 PM By: Lorine Bears RCP, RRT, CHT Entered By: Lorine Bears on 05/14/2019 10:42:32 [name] ([id number]) -------------------------------------------------------------------------------- Encounter Discharge Information Details Patient Name: [name] Date of Service: 05/14/2019 10:30 AM Medical Record Number: [id number] Patient Account Number: [id number] Date of Birth/Sex: 25-Mar-1946 (73 y.o. M) Treating RN: Cornell Barman Primary Care Aithana Kushner: [name] Other Clinician: Referring Sheralee Qazi: [name] Treating Denzel Etienne/Extender: Melburn Hake, HOYT Weeks in Treatment: 13 Encounter Discharge Information Items Post Procedure  Vitals Discharge Condition: Stable Temperature (F): 97.6 Ambulatory Status: Ambulatory Pulse (bpm): 103 Discharge Destination: Home Respiratory Rate (breaths/min): 16 Transportation: Private Auto Blood Pressure (mmHg): 153/81 Accompanied By: self Schedule Follow-up Appointment: Yes Clinical Summary of Care: Electronic Signature(s) Signed: 05/17/2019 5:22:08 PM By: Gretta Cool, BSN, RN, CWS, Kim RN, BSN Entered By: Gretta Cool, BSN, RN, CWS, Kim on 05/14/2019 10:56:35 [name] ([id number]) -------------------------------------------------------------------------------- Lower Extremity Assessment Details Patient Name: [name] Date of Service: 05/14/2019 10:30 AM Medical Record Number: [id number] Patient Account Number: [id number] Date of Birth/Sex: [birth year] (73 y.o. M) Treating RN: Montey Hora Primary Care Phenix Vandermeulen: [name] Other Clinician: Referring Torry Adamczak: [name] Treating Kadden Osterhout/Extender: [name], HOYT Weeks in Treatment: 13 Edema Assessment Assessed: [Left: No] [Right: No] Edema: [Left: N] [Right: o] Vascular Assessment Pulses: Dorsalis Pedis Palpable: [Left:Yes] Electronic Signature(s) Signed: 05/14/2019 4:01:28 PM By: Montey Hora Entered By: Montey Hora on 05/14/2019 10:47:48 [name] ([id number]) -------------------------------------------------------------------------------- Multi Wound Chart Details Patient Name: [name] Date of Service: 05/14/2019 10:30 AM Medical Record Number: [id number] Patient Account Number: [id number] Date of Birth/Sex: 1946-01-18 (73 y.o. M) Treating RN: Cornell Barman Primary Care Kaeden Mester: [name] Other Clinician: Referring Leonce Bale: [name] Treating Danilynn Jemison/Extender: Melburn Hake, HOYT Weeks in Treatment: 13 Vital Signs Height(in): 71 Pulse(bpm): 103 Weight(lbs): 205 Blood Pressure(mmHg): 153/81 Body Mass Index(BMI): 29 Temperature(F): 97.6 Respiratory  Rate 16 (breaths/min): Photos: [N/A:N/A] Wound Location: Left Foot - Lateral N/A N/A Wounding Event: Pressure Injury N/A N/A Primary Etiology: Diabetic Wound/Ulcer of the N/A N/A Lower Extremity Comorbid History: Cataracts, Middle ear N/A N/A problems, Sleep Apnea, Hypertension, Type II Diabetes, Gout, Osteoarthritis, Neuropathy Date Acquired: 01/19/2019 N/A N/A Weeks of Treatment: 13 N/A N/A Wound Status: Open N/A N/A Measurements L x W x D 0.4x1.6x0.1 N/A N/A (cm) Area (cm) : 0.503 N/A N/A Volume (cm) : 0.05 N/A N/A % Reduction in Area: -82.90% N/A N/A % Reduction in Volume: 39.00% N/A N/A Classification: Grade 2 N/A N/A Exudate Amount: Medium N/A N/A Exudate  Type: Serosanguineous N/A N/A Exudate Color: red, brown N/A N/A Wound Margin: Flat and Intact N/A N/A Granulation Amount: Large (67-100%) N/A N/A Granulation Quality: Pink N/A N/A Necrotic Amount: Small (1-33%) N/A N/A Exposed Structures: Fat Layer (Subcutaneous N/A N/A Tissue) Exposed: Yes Fascia: No [name], [name] ([id number]) Tendon: No Muscle: No Joint: No Bone: No Epithelialization: None N/A N/A Treatment Notes Electronic Signature(s) Signed: 05/17/2019 5:22:08 PM By: Gretta Cool, BSN, RN, CWS, Kim RN, BSN Entered By: Gretta Cool, BSN, RN, CWS, Kim on 05/14/2019 10:52:32 [name] ([id number]) -------------------------------------------------------------------------------- Multi-Disciplinary Care Plan Details Patient Name: [name] Date of Service: 05/14/2019 10:30 AM Medical Record Number: [id number] Patient Account Number: [id number] Date of Birth/Sex: 1945-06-21 (73 y.o. M) Treating RN: Cornell Barman Primary Care Derriana Oser: [name] Other Clinician: Referring Shameca Landen: [name] Treating Jeris Roser/Extender: Melburn Hake, HOYT Weeks in Treatment: 13 Active Inactive Pressure Nursing Diagnoses: Knowledge deficit related to management of pressures ulcers Goals: Patient will remain free  from development of additional pressure ulcers Date Initiated: 04/30/2019 Target Resolution Date: 05/14/2019 Goal Status: Active Patient/caregiver will verbalize understanding of pressure ulcer management Date Initiated: 04/30/2019 Target Resolution Date: 05/14/2019 Goal Status: Active Interventions: Assess: immobility, friction, shearing, incontinence upon admission and as needed Treatment Activities: Patient referred for pressure reduction/relief devices : 04/30/2019 Notes: Wound/Skin Impairment Nursing Diagnoses: Impaired tissue integrity Goals: Ulcer/skin breakdown will have a volume reduction of 30% by week 4 Date Initiated: 04/30/2019 Target Resolution Date: 05/31/2019 Goal Status: Active Interventions: Assess patient/caregiver ability to obtain necessary supplies Assess ulceration(s) every visit Treatment Activities: Referred to DME Aren Cherne for dressing supplies : 04/30/2019 Skin care regimen initiated : 04/30/2019 Topical wound management initiated : 04/30/2019 Notes: [name], [name] ([id number]) Electronic Signature(s) Signed: 05/17/2019 5:22:08 PM By: Gretta Cool, BSN, RN, CWS, Kim RN, BSN Entered By: Gretta Cool, BSN, RN, CWS, Kim on 05/14/2019 10:52:19 [name] ([id number]) -------------------------------------------------------------------------------- Pain Assessment Details Patient Name: [name] Date of Service: 05/14/2019 10:30 AM Medical Record Number: [id number] Patient Account Number: [id number] Date of Birth/Sex: March 14, 1946 (73 y.o. M) Treating RN: Cornell Barman Primary Care Alicia Ackert: [name] Other Clinician: Referring Akito Boomhower: [name] Treating Jadon Ressler/Extender: Melburn Hake, HOYT Weeks in Treatment: 13 Active Problems Location of Pain Severity and Description of Pain Patient Has Paino No Site Locations Pain Management and Medication Current Pain Management: Electronic Signature(s) Signed: 05/14/2019 3:33:21 PM By: Lorine Bears  RCP, RRT, CHT Signed: 05/17/2019 5:22:08 PM By: Gretta Cool, BSN, RN, CWS, Kim RN, BSN Entered By: Lorine Bears on 05/14/2019 10:42:37 [name] ([id number]) -------------------------------------------------------------------------------- Patient/Caregiver Education Details Patient Name: [name] Date of Service: 05/14/2019 10:30 AM Medical Record Number: [id number] Patient Account Number: [id number] Date of Birth/Gender: Jan 14, 1946 (73 y.o. M) Treating RN: Cornell Barman Primary Care Physician: [name] Other Clinician: Referring Physician: [name] Treating Physician/Extender: Sharalyn Ink in Treatment: 13 Education Assessment Education Provided To: Patient Education Topics Provided Wound Debridement: Handouts: Wound Debridement Methods: Demonstration, Explain/Verbal Responses: State content correctly Wound/Skin Impairment: Handouts: Caring for Your Ulcer Methods: Demonstration Responses: State content correctly Electronic Signature(s) Signed: 05/17/2019 5:22:08 PM By: Gretta Cool, BSN, RN, CWS, Kim RN, BSN Entered By: Gretta Cool, BSN, RN, CWS, Kim on 05/14/2019 10:55:51 [name] ([id number]) -------------------------------------------------------------------------------- Wound Assessment Details Patient Name: [name] Date of Service: 05/14/2019 10:30 AM Medical Record Number: [id number] Patient Account Number: [id number] Date of Birth/Sex: 02-07-1946 (73 y.o. M) Treating RN: Montey Hora Primary Care Tashea Othman: [name] Other Clinician: Referring Naveed Humphres: [name] Treating Janeal Abadi/Extender: Melburn Hake,  HOYT Weeks in Treatment: 13 Wound Status Wound Number: 5 Primary Diabetic Wound/Ulcer of the Lower Extremity Etiology: Wound Location: Left Foot - Lateral Wound Open Wounding Event: Pressure Injury Status: Date Acquired: 01/19/2019 Comorbid Cataracts, Middle ear problems, Sleep Apnea, Weeks Of Treatment:  13 History: Hypertension, Type II Diabetes, Gout, Clustered Wound: No Osteoarthritis, Neuropathy Photos Wound Measurements Length: (cm) 0.4 % Reduction i Width: (cm) 1.6 % Reduction i Depth: (cm) 0.1 Epithelializa Area: (cm) 0.503 Tunneling: Volume: (cm) 0.05 Undermining: n Area: -82.9% n Volume: 39% tion: None No No Wound Description Classification: Grade 2 Foul Odor Aft Wound Margin: Flat and Intact Slough/Fibrin Exudate Amount: Medium Exudate Type: Serosanguineous Exudate Color: red, brown er Cleansing: No o Yes Wound Bed Granulation Amount: Large (67-100%) Exposed Structure Granulation Quality: Pink Fascia Exposed: No Necrotic Amount: Small (1-33%) Fat Layer (Subcutaneous Tissue) Exposed: Yes Necrotic Quality: Adherent Slough Tendon Exposed: No Muscle Exposed: No Joint Exposed: No Bone Exposed: No Treatment Notes [name], [name] ([id number]) Wound #5 (Left, Lateral Foot) Notes Silver collagen, Felt for offloading, conform Electronic Signature(s) Signed: 05/14/2019 4:01:28 PM By: Montey Hora Entered By: Montey Hora on 05/14/2019 10:47:28 [name] ([id number]) -------------------------------------------------------------------------------- Vitals Details Patient Name: [name] Date of Service: 05/14/2019 10:30 AM Medical Record Number: [id number] Patient Account Number: [id number] Date of Birth/Sex: [birth year] (73 y.o. M) Treating RN: Cornell Barman Primary Care Mariella Blackwelder: [name] Other Clinician: Referring Eriyana Sweeten: [name] Treating Amena Dockham/Extender: Melburn Hake, HOYT Weeks in Treatment: 13 Vital Signs Time Taken: 10:40 Temperature (F): 97.6 Height (in): 71 Pulse (bpm): 103 Weight (lbs): 205 Respiratory Rate (breaths/min): 16 Body Mass Index (BMI): 28.6 Blood Pressure (mmHg): 153/81 Reference Range: 80 - 120 mg / dl Electronic Signature(s) Signed: 05/14/2019 3:33:21 PM By: Lorine Bears RCP, RRT,  CHT Entered By: Lorine Bears on 05/14/2019 10:43:08

## 2019-05-21 ENCOUNTER — Encounter: Payer: Medicare Other | Admitting: Physician Assistant

## 2019-05-21 ENCOUNTER — Other Ambulatory Visit: Payer: Self-pay

## 2019-05-21 DIAGNOSIS — Z8249 Family history of ischemic heart disease and other diseases of the circulatory system: Secondary | ICD-10-CM | POA: Diagnosis not present

## 2019-05-21 DIAGNOSIS — M109 Gout, unspecified: Secondary | ICD-10-CM | POA: Diagnosis not present

## 2019-05-21 DIAGNOSIS — Z87891 Personal history of nicotine dependence: Secondary | ICD-10-CM | POA: Diagnosis not present

## 2019-05-21 DIAGNOSIS — M1990 Unspecified osteoarthritis, unspecified site: Secondary | ICD-10-CM | POA: Diagnosis not present

## 2019-05-21 DIAGNOSIS — M21372 Foot drop, left foot: Secondary | ICD-10-CM | POA: Diagnosis not present

## 2019-05-21 DIAGNOSIS — Z833 Family history of diabetes mellitus: Secondary | ICD-10-CM | POA: Diagnosis not present

## 2019-05-21 DIAGNOSIS — E1151 Type 2 diabetes mellitus with diabetic peripheral angiopathy without gangrene: Secondary | ICD-10-CM | POA: Diagnosis not present

## 2019-05-21 DIAGNOSIS — I10 Essential (primary) hypertension: Secondary | ICD-10-CM | POA: Diagnosis not present

## 2019-05-21 DIAGNOSIS — Z794 Long term (current) use of insulin: Secondary | ICD-10-CM | POA: Diagnosis not present

## 2019-05-21 DIAGNOSIS — I252 Old myocardial infarction: Secondary | ICD-10-CM | POA: Diagnosis not present

## 2019-05-21 DIAGNOSIS — Z809 Family history of malignant neoplasm, unspecified: Secondary | ICD-10-CM | POA: Diagnosis not present

## 2019-05-21 DIAGNOSIS — Z823 Family history of stroke: Secondary | ICD-10-CM | POA: Diagnosis not present

## 2019-05-21 DIAGNOSIS — E1142 Type 2 diabetes mellitus with diabetic polyneuropathy: Secondary | ICD-10-CM | POA: Diagnosis not present

## 2019-05-21 DIAGNOSIS — L97522 Non-pressure chronic ulcer of other part of left foot with fat layer exposed: Secondary | ICD-10-CM | POA: Diagnosis not present

## 2019-05-21 DIAGNOSIS — E11621 Type 2 diabetes mellitus with foot ulcer: Secondary | ICD-10-CM | POA: Diagnosis not present

## 2019-05-21 NOTE — Progress Notes (Addendum)
[name], [name] ([id number]) Visit Report for 05/21/2019 Chief Complaint Document Details Patient Name: [name], [name] Date of Service: 05/21/2019 10:30 AM Medical Record Number: [id number] Patient Account Number: [id number] Date of Birth/Sex: 1945/12/22 (74 y.o. M) Treating RN: Primary Care Provider: [name] Other Clinician: Referring Provider: [name] Treating Provider/Extender: Melburn Hake, Deeana Atwater Weeks in Treatment: 14 Information Obtained from: Patient Chief Complaint Left dorsal foot ulcer 02/09/2019; patient is here for review of the wound on the left lateral foot roughly at the level of the base of the fifth metatarsal Electronic Signature(s) Signed: 05/21/2019 10:58:01 AM By: Worthy Keeler [name] Entered By: Worthy Keeler on 05/21/2019 10:58:00 [name] ([id number]) -------------------------------------------------------------------------------- Debridement Details Patient Name: [name] Date of Service: 05/21/2019 10:30 AM Medical Record Number: [id number] Patient Account Number: [id number] Date of Birth/Sex: 05/06/1945 (74 y.o. M) Treating RN: Cornell Barman Primary Care Provider: [name] Other Clinician: Referring Provider: [name] Treating Provider/Extender: Melburn Hake, Alvena Kiernan Weeks in Treatment: 14 Debridement Performed for Wound #5 Left,Lateral Foot Assessment: Performed By: Physician [name], Tandre Conly E., [name] Debridement Type: Debridement Severity of Tissue Pre Fat layer exposed Debridement: Level of Consciousness (Pre- Awake and Alert procedure): Pre-procedure Verification/Time Yes - 10:59 Out Taken: Start Time: 10:59 Pain Control: Lidocaine Total Area Debrided (L x W): 0.5 (cm) x 0.7 (cm) = 0.35 (cm) Tissue and other material Viable, Non-Viable, Callus, Slough, Subcutaneous, Biofilm, Slough debrided: Level: Skin/Subcutaneous Tissue Debridement Description: Excisional Instrument: Forceps Bleeding:  Moderate Hemostasis Achieved: Pressure End Time: 11:03 Response to Treatment: Procedure was tolerated well Level of Consciousness Awake and Alert (Post-procedure): Post Debridement Measurements of Total Wound Length: (cm) 0.5 Width: (cm) 0.7 Depth: (cm) 0.2 Volume: (cm) 0.055 Character of Wound/Ulcer Post Debridement: Stable Severity of Tissue Post Debridement: Fat layer exposed Post Procedure Diagnosis Same as Pre-procedure Electronic Signature(s) Signed: 05/21/2019 5:41:25 PM By: Worthy Keeler [name] Signed: 05/22/2019 4:33:39 PM By: Gretta Cool, BSN, RN, CWS, Kim RN, BSN Entered By: Gretta Cool, BSN, RN, CWS, Kim on 05/21/2019 11:02:15 [name] ([id number]) -------------------------------------------------------------------------------- HPI Details Patient Name: [name] Date of Service: 05/21/2019 10:30 AM Medical Record Number: [id number] Patient Account Number: [id number] Date of Birth/Sex: [birth year] (74 y.o. M) Treating RN: Primary Care Provider: [name] Other Clinician: Referring Provider: [name] Treating Provider/Extender: Melburn Hake, Ercole Georg Weeks in Treatment: 14 History of Present Illness HPI Description: 10/04/16 on evaluation today patient presents for initial evaluation concerning a laceration of the right wrist and hand on the bowler aspect. He tells me that he fell into glass following a medication change in regard to his diabetes. He initially went to [facility] where she was sutured and subsequently his daughter who is a Marine scientist removed the teachers at the appropriate time. Unfortunately the wound has done poorly and dehissed which has caused him some trouble including infection. He has been on antibiotics both topically and orally though the wound continues to show signs of necrotic tissue according to notes which is what he was sent to [place] for evaluation concerning. He does have diabetes and unfortunately this is severely uncontrolled. He does  see an endocrinologist and is working on this. 10/11/2016 -- the patient's notes from the ER were reviewed and he had his suturing done on 09/17/2016 and had necrotic skin flaps which had to be trimmed the last visit he was here. He has managed to get his Santyl ointment and has been using this appropriately. 10/21/16 patient's wound appears to be doing very well  on evaluation today. He still has some Slough covering the wound bed but this is nowhere near as severe as when i saw this initially two weeks ago nor even my review of his pictures last week. I'm pleased with how this is progressing. Patient's wound over the right form appears to be doing fairly well on evaluation today. There is no evidence of infection and he has a small area at the crease of where she is wrist extends and flexes that is still open and appears to be having difficulty due to the fact that it is cracking open. I think this is something that may be controlled with moisture control that is keeping the wound a little bit more moist. Nonetheless this is a tiny region compared to the initial wound and overall appears to be doing very well. No fevers, chills, nausea, or vomiting noted at this time. 11/25/16 on evaluation today patient appears to be doing well in regard to his right wrist ulcer. In fact this appears to be completely healed he is having no discomfort. He is pleased with how this is progressed. Readmission: 06/24/17 on evaluation today patient appears back in our clinic regarding issues that he has been having with his right ankle on the lateral malleolus as well is in the dorsal surface of his left foot. Both have been going on for several weeks unfortunately. He tells me that initially this was being managed by Dr. Edilia Bo and I did review the notes from Dr. Edilia Bo in epic. Patient was placed on amoxicillin initially and then subsequently Keflex following. He has been using triple antibiotic  ointment over-the-counter along with a Band-Aid for the wound currently. He did not have any Santyl remaining. His most recent hemoglobin A1c which was just two days ago was 10.3. Fortunately he seems to have excellent blood flow in the bilateral lower extremities with his left ABI being 0.95 in the right ABI 1.01. Patient does have some pain in regard to each of these ulcerations but he tells me it's not nearly as severe as the hand wound that I help treat earlier over the summer. This was in 2018. 07/15/17 on evaluation today patient's lower surety ulcer is actually both appear to be doing fairly well at this point. He does have a little bit of skin overlapping the ulcer on the right lateral ankle this was debrided away today just with saline and gauze without complication patient had no discomfort or pain. Otherwise patient's left foot ulcer does appear to be showing signs of improvement he does seem to have a right great toe. Nikki and noted at this point in time. I think that this does need to be managed at this point. No fevers, chills, nausea, or vomiting noted at this time. 07/22/17 on evaluation today patient's wounds in regard to his lower extremities appear to be doing somewhat better. His right ankle wound in fact appears to likely be completely closed. The left dorsal foot ulcer though still open does appear to be a little bit smaller and does also seem to be making good progress. He has not been having problems with the treatment at this point in the general he does not seem to show as much erythema surrounding the wound bed which is good news. He is not KASEEM, Dustin E. ([id number]) having significant discomfort. 08/12/17 on evaluation today patients wounds of both appeared to be doing better in fact they both appear to be completely healed. Overall I'm very pleased with how things  have progressed over the past week even more than what I expected. Obviously this is good news and he is  happy about this. He did have a fall when going to his orthopedic doctor recently fortunately he does not appear to have injured anything severely but nonetheless does have a couple of bumps and bruises nothing significant at this time but he needs to be seen and wound care for. He may have broken his left wrist he is following up with orthopedics in that regard. Readmission: 10/24/17 on evaluation today patient presents for reevaluation after having been discharged Aug 12, 2017. His wound was healed at that point although he states that it has been somewhat discolored since that time. He has not experienced the drainage as far as the wound is concerned but the fact that it was still discolored been greater than two months after the fact made him want to come in for reevaluation. Upon inspection today the patient has no pain although he does have neuropathy. He does have a discolored region of the dorsal surface of the left foot which was the site where the ulcer was that we previously treated. The good news is he does not again have any discomfort although obviously I do believe this may be some scar tissue and just discoloration in that regard. Again he hasn't had any drainage or discharge since I last saw him and at this point I see no evidence of that either this area shows complete epithelialization. In general I do not feel like there's any significant issue at this point in that regard. 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 He has been in this clinic before in 2018 with a laceration on his right hand and then again in 2019 with an area over the right lateral malleolus. 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 He states that the wound is been there for the last week or 2. It is been uncomfortable to walk on. The man is an  active man having to look after an elderly mother. Past medical history, type 2 diabetes with polyneuropathy with a recent hemoglobin A1c in September 2011 0.3, lumbar disc disease, hypergonadism., Left foot drop and gout. We did not repeat his ABIs in the clinic quoting values from July 2019 of 0.95 and 1.01. He is not felt to have significant PAD 02/19/2019 on evaluation today patient actually appears to be doing better with regard to his foot wound based on what I am seeing currently. He was seen by Dr. Dellia Nims on readmission back into the clinic last week when I was off. The good news is the patient has been tolerating the dressing changes without any complication in fact has not really had much in the way of drainage. He does have an AFO brace which she believes is actually pushing on this area as well and has contributed to the issue. Fortunately there is no signs of active infection at this point. His ABIs appear to be well. 03/05/2019 on evaluation today patient appears to be doing well in regard to his foot ulcer. He has been tolerating the dressing changes without complication. Fortunately there is no signs of active infection. The wound is measuring somewhat smaller and overall I feel like he is making good progress at this point. 03/12/2019 on evaluation today patient actually appears to be  doing quite well with regard to his foot ulcer. The wound is looking better the measurement is not significantly better at this point unfortunately but nonetheless again the overall appearance is improving I think he is making some progress. I still think the big issue here is foot drop and again the patient wants to see if potentially he could get a brace to help with this. Subsequently I think that we can definitely make a referral to [facility] to see if they can help [place] in this regard. He is in agreement with that plan. 03/26/2019 on evaluation today patient appears to be doing more poorly in  regard to his foot ulcer at this time. He did see podiatry in order to see about getting a brace but unfortunately he tells me that Medicare is not likely to pay for one for him as he had 1 I guess 8 months ago. However this one that apparently goes in his shoes that does not seem to be working out well for him. Unfortunately there are signs of active infection at this time. No fevers, chills, nausea, vomiting, or diarrhea. Systemically. The patient has erythema on the dorsal surface of his foot that tracks from the lateral portion of his foot and again he does have a blister around the wound as well which is worse compared to previous. Overall I am concerned. 04/02/2019 on evaluation today patient appears to be doing about the same with regard to his wound. Unfortunately there is no signs of significant improvement although I do think there is no signs of infection which is good news. His drainage is also AZLAN, HANWAY E. ([id number]) slowed down tremendously. I think at this point he would benefit from the initiation of a total contact cast. The only concern I have is that over his balance. I explained to the patient that if we were to initiate the cast I think it would be greatly beneficial for him but he would need to ensure not to walk at all unless he uses a walker he actually has 2 walkers one for inside his home and one that he keeps out in the car. For that reason he would be covered in that regard. I just do not want him to have any accidents and fall with the cast. 12/28-Patient comes in 1 week out a day early for his appointment he was in the hospital for 2 days for uncontrolled hyperglycemia and required management of high blood sugars, with some adjustments in his home regimen of insulin, patient stubbed his right great toe today and put a Band-Aid on it, he is here for his left lateral foot ulcer and we are using silver alginate dressing, he was intact total contact cast with  difficulty with balance to the extent that that had to be discontinued 04/16/2019 on evaluation today patient appears to be doing better with regard to the overall appearance of his wound size. This is good news. We had attempted a total contact cast but unfortunately he had some falling issues that the family contacted [place] about we took the cast off we did not put it back on. Nonetheless it ended up that just a couple days later he was in the hospital due to elevations in his blood sugar which went to around 500 and even a little higher. Subsequently he was admitted to the hospital and was in the hospital for a total of 3 days and that included Christmas day. Fortunately there got his blood sugar under control  he tells me that this is running about 200 now which is much better news. There does not appear to be any signs of active infection at this time. No fevers, chills, nausea, vomiting, or diarrhea. 04/30/2019 on evaluation today patient actually appears to be doing slightly worse compared to previous as far as the overall size of his wound is concerned. There also is some need for sharp debridement today as well. Fortunately there is no evidence of active infection which is good news. No fevers, chills, nausea, vomiting, or diarrhea. 05/07/2019 upon evaluation today patient appears to be doing excellent in regard to his plantar foot ulcer. He has been tolerating the dressing changes without complication. Fortunately there is no signs of active infection at this time. No fevers, chills, nausea, vomiting, or diarrhea. He does tell me that he actually got in touch with [employer] and they feel like they can add diagnosis codes to his orders in order to get an external brace for him as opposed to the one that goes internal to issue. That would be excellent for his foot drop and I did tell him that if there is anything I need to fill out in that regard I will be more than happy to oblige and fill out what is  needed for [employer]. 05/14/2019 upon evaluation today patient's original wound site actually appears to be doing much better which is good news. Fortunately there is no signs of active infection at this time. No fevers, chills, nausea, vomiting, or diarrhea. With that being said the patient unfortunately is continuing to have issues with rubbing he tells me that the felt slid on his foot although he did not realize it until today. Nonetheless I think this did cause a blister around the edge of the wound and he does an open wound that is slightly larger overall in measurement compared to last time mainly due to this blister. Other than that I feel like he is doing quite well. 05/21/2019 upon evaluation today patient appears to be doing excellent in regard to his wound at this time on the plantar foot. He is showing signs of improvement he does have some slough and biofilm noted on the surface of the wound this is going require some sharp debridement today as well. Fortunately there is no evidence of active infection. No fevers, chills, nausea, vomiting, or diarrhea. Electronic Signature(s) Signed: 05/21/2019 12:55:41 PM By: Worthy Keeler [name] Entered By: Worthy Keeler on 05/21/2019 12:55:41 [name] ([id number]) -------------------------------------------------------------------------------- Physical Exam Details Patient Name: [name] Date of Service: 05/21/2019 10:30 AM Medical Record Number: [id number] Patient Account Number: [id number] Date of Birth/Sex: April 26, 1945 (74 y.o. M) Treating RN: Primary Care Provider: [name] Other Clinician: Referring Provider: [name] Treating Provider/Extender: [name], Hillary Schwegler Weeks in Treatment: 46 Constitutional Well-nourished and well-hydrated in no acute distress. Respiratory normal breathing without difficulty. Psychiatric this patient is able to make decisions and demonstrates good insight into disease process. Alert  and Oriented x 3. pleasant and cooperative. Notes Patient's wound bed currently showed signs of good granulation at this point overall I do feel like he is doing well I did have to perform sharp debridement to remove some slough and necrotic debris from the surface of the wound he tolerated that today without complication post debridement the wound bed appears to be doing much better. Electronic Signature(s) Signed: 05/21/2019 12:56:00 PM By: Worthy Keeler [name] Entered By: Worthy Keeler on 05/21/2019 12:56:00 [name] ([id number]) --------------------------------------------------------------------------------  Physician Orders Details Patient Name: [name], [name] Date of Service: 05/21/2019 10:30 AM Medical Record Number: [id number] Patient Account Number: [id number] Date of Birth/Sex: 09/08/1945 (74 y.o. M) Treating RN: Cornell Barman Primary Care Provider: [name] Other Clinician: Referring Provider: [name] Treating Provider/Extender: Melburn Hake, Oksana Deberry Weeks in Treatment: 14 Verbal / Phone Orders: No Diagnosis Coding ICD-10 Coding Code Description E11.621 Type 2 diabetes mellitus with foot ulcer E11.42 Type 2 diabetes mellitus with diabetic polyneuropathy L97.522 Non-pressure chronic ulcer of other part of left foot with fat layer exposed M21.372 Foot drop, left foot Wound Cleansing Wound #5 Left,Lateral Foot o May shower with protection. Anesthetic (add to Medication List) Wound #5 Left,Lateral Foot o Topical Lidocaine 4% cream applied to wound bed prior to debridement (In Clinic Only). Primary Wound Dressing Wound #5 Left,Lateral Foot o Silver Collagen Secondary Dressing Wound #5 Left,Lateral Foot o Conform/Kerlix - Rolled gauze o Other - Felt to offload Follow-up Appointments Wound #5 Left,Lateral Foot o Return Appointment in 1 week. Off-Loading o Turn and reposition every 2 hours - Elevate foot when sitting Additional Orders /  Instructions Wound #5 Left,Lateral Foot o Other: - Patient to follow-up with [employer] for adjustments to braces and shoes. Electronic Signature(s) Signed: 05/21/2019 5:41:25 PM By: Worthy Keeler [name] Signed: 05/22/2019 4:33:39 PM By: Gretta Cool, BSN, RN, CWS, Kim RN, BSN Entered By: Gretta Cool, BSN, RN, CWS, Kim on 05/21/2019 11:03:01 [name], [name] ([id number]) [name], [name] ([id number]) -------------------------------------------------------------------------------- Problem List Details Patient Name: [name] Date of Service: 05/21/2019 10:30 AM Medical Record Number: [id number] Patient Account Number: [id number] Date of Birth/Sex: 1945-11-11 (74 y.o. M) Treating RN: Primary Care Provider: [name] Other Clinician: Referring Provider: [name] Treating Provider/Extender: Melburn Hake, Sargun Rummell Weeks in Treatment: 14 Active Problems ICD-10 Evaluated Encounter Code Description Active Date Today Diagnosis E11.621 Type 2 diabetes mellitus with foot ulcer 02/09/2019 No Yes E11.42 Type 2 diabetes mellitus with diabetic polyneuropathy 02/09/2019 No Yes L97.522 Non-pressure chronic ulcer of other part of left foot with fat 02/09/2019 No Yes layer exposed M21.372 Foot drop, left foot 02/09/2019 No Yes Inactive Problems Resolved Problems Electronic Signature(s) Signed: 05/21/2019 10:57:56 AM By: Worthy Keeler [name] Entered By: Worthy Keeler on 05/21/2019 10:57:55 [name] ([id number]) -------------------------------------------------------------------------------- Progress Note Details Patient Name: [name] Date of Service: 05/21/2019 10:30 AM Medical Record Number: [id number] Patient Account Number: [id number] Date of Birth/Sex: [birth year] (74 y.o. M) Treating RN: Primary Care Provider: [name] Other Clinician: Referring Provider: [name] Treating Provider/Extender: Melburn Hake, Marquasha Brutus Weeks in Treatment: 14 Subjective Chief  Complaint Information obtained from Patient Left dorsal foot ulcer 02/09/2019; patient is here for review of the wound on the left lateral foot roughly at the level of the base of the fifth metatarsal History of Present Illness (HPI) 10/04/16 on evaluation today patient presents for initial evaluation concerning a laceration of the right wrist and hand on the bowler aspect. He tells me that he fell into glass following a medication change in regard to his diabetes. He initially went to [facility] where she was sutured and subsequently his daughter who is a Marine scientist removed the teachers at the appropriate time. Unfortunately the wound has done poorly and dehissed which has caused him some trouble including infection. He has been on antibiotics both topically and orally though the wound continues to show signs of necrotic tissue according to notes which is what he was sent to [place] for evaluation concerning. He does have diabetes and  unfortunately this is severely uncontrolled. He does see an endocrinologist and is working on this. 10/11/2016 -- the patient's notes from the ER were reviewed and he had his suturing done on 09/17/2016 and had necrotic skin flaps which had to be trimmed the last visit he was here. He has managed to get his Santyl ointment and has been using this appropriately. 10/21/16 patient's wound appears to be doing very well on evaluation today. He still has some Slough covering the wound bed but this is nowhere near as severe as when i saw this initially two weeks ago nor even my review of his pictures last week. I'm pleased with how this is progressing. Patient's wound over the right form appears to be doing fairly well on evaluation today. There is no evidence of infection and he has a small area at the crease of where she is wrist extends and flexes that is still open and appears to be having difficulty due to the fact that it is cracking open. I think this is something that may  be controlled with moisture control that is keeping the wound a little bit more moist. Nonetheless this is a tiny region compared to the initial wound and overall appears to be doing very well. No fevers, chills, nausea, or vomiting noted at this time. 11/25/16 on evaluation today patient appears to be doing well in regard to his right wrist ulcer. In fact this appears to be completely healed he is having no discomfort. He is pleased with how this is progressed. Readmission: 06/24/17 on evaluation today patient appears back in our clinic regarding issues that he has been having with his right ankle on the lateral malleolus as well is in the dorsal surface of his left foot. Both have been going on for several weeks unfortunately. He tells me that initially this was being managed by Dr. Edilia Bo and I did review the notes from Dr. Edilia Bo in epic. Patient was placed on amoxicillin initially and then subsequently Keflex following. He has been using triple antibiotic ointment over-the-counter along with a Band-Aid for the wound currently. He did not have any Santyl remaining. His most recent hemoglobin A1c which was just two days ago was 10.3. Fortunately he seems to have excellent blood flow in the bilateral lower extremities with his left ABI being 0.95 in the right ABI 1.01. Patient does have some pain in regard to each of these ulcerations but he tells me it's not nearly as severe as the hand wound that I help treat earlier over the summer. This was in 2018. 07/15/17 on evaluation today patient's lower surety ulcer is actually both appear to be doing fairly well at this point. He does have a little bit of skin overlapping the ulcer on the right lateral ankle this was debrided away today just with saline and gauze without complication patient had no discomfort or pain. Otherwise patient's left foot ulcer does appear to be showing signs of DAL, BLEW. ([id number]) improvement he does seem to have  a right great toe. Nikki and noted at this point in time. I think that this does need to be managed at this point. No fevers, chills, nausea, or vomiting noted at this time. 07/22/17 on evaluation today patient's wounds in regard to his lower extremities appear to be doing somewhat better. His right ankle wound in fact appears to likely be completely closed. The left dorsal foot ulcer though still open does appear to be a little bit smaller and does also  seem to be making good progress. He has not been having problems with the treatment at this point in the general he does not seem to show as much erythema surrounding the wound bed which is good news. He is not having significant discomfort. 08/12/17 on evaluation today patients wounds of both appeared to be doing better in fact they both appear to be completely healed. Overall I'm very pleased with how things have progressed over the past week even more than what I expected. Obviously this is good news and he is happy about this. He did have a fall when going to his orthopedic doctor recently fortunately he does not appear to have injured anything severely but nonetheless does have a couple of bumps and bruises nothing significant at this time but he needs to be seen and wound care for. He may have broken his left wrist he is following up with orthopedics in that regard. Readmission: 10/24/17 on evaluation today patient presents for reevaluation after having been discharged Aug 12, 2017. His wound was healed at that point although he states that it has been somewhat discolored since that time. He has not experienced the drainage as far as the wound is concerned but the fact that it was still discolored been greater than two months after the fact made him want to come in for reevaluation. Upon inspection today the patient has no pain although he does have neuropathy. He does have a discolored region of the dorsal surface of the left foot which was the  site where the ulcer was that we previously treated. The good news is he does not again have any discomfort although obviously I do believe this may be some scar tissue and just discoloration in that regard. Again he hasn't had any drainage or discharge since I last saw him and at this point I see no evidence of that either this area shows complete epithelialization. In general I do not feel like there's any significant issue at this point in that regard. 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 He has been in this clinic before in 2018 with a laceration on his right hand and then again in 2019 with an area over the right lateral malleolus. 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 He states that the wound is been there for the last week or 2. It is been uncomfortable to walk on. The man is an active man having to look after an elderly mother. Past medical history, type 2 diabetes with polyneuropathy with a recent hemoglobin A1c in September 2011 0.3, lumbar disc disease, hypergonadism., Left foot drop and gout. We did not repeat his ABIs in the clinic quoting values from July 2019 of 0.95 and 1.01. He is not felt to have significant PAD 02/19/2019 on evaluation today patient actually appears to be doing better with regard to his foot wound based on what I am seeing currently. He was seen by Dr. Dellia Nims on readmission back into the clinic last week when I was off. The good news is the patient has been tolerating the dressing changes without any complication in fact has not really had much in the way of drainage. He does have an AFO brace which she believes is actually pushing on this area as well and has contributed to the issue. Fortunately there is no signs of  active infection at this point. His ABIs appear to be well. 03/05/2019  on evaluation today patient appears to be doing well in regard to his foot ulcer. He has been tolerating the dressing changes without complication. Fortunately there is no signs of active infection. The wound is measuring somewhat smaller and overall I feel like he is making good progress at this point. 03/12/2019 on evaluation today patient actually appears to be doing quite well with regard to his foot ulcer. The wound is looking better the measurement is not significantly better at this point unfortunately but nonetheless again the overall appearance is improving I think he is making some progress. I still think the big issue here is foot drop and again the patient wants to see if potentially he could get a brace to help with this. Subsequently I think that we can definitely make a referral to [facility] to see if they can help [place] in this regard. He is in agreement with that plan. 03/26/2019 on evaluation today patient appears to be doing more poorly in regard to his foot ulcer at this time. He did see podiatry in order to see about getting a brace but unfortunately he tells me that Medicare is not likely to pay for one for him as [name], [name]. ([id number]) he had 1 I guess 8 months ago. However this one that apparently goes in his shoes that does not seem to be working out well for him. Unfortunately there are signs of active infection at this time. No fevers, chills, nausea, vomiting, or diarrhea. Systemically. The patient has erythema on the dorsal surface of his foot that tracks from the lateral portion of his foot and again he does have a blister around the wound as well which is worse compared to previous. Overall I am concerned. 04/02/2019 on evaluation today patient appears to be doing about the same with regard to his wound. Unfortunately there is no signs of significant improvement although I do think there is no signs of infection which is good news. His drainage is also slowed down  tremendously. I think at this point he would benefit from the initiation of a total contact cast. The only concern I have is that over his balance. I explained to the patient that if we were to initiate the cast I think it would be greatly beneficial for him but he would need to ensure not to walk at all unless he uses a walker he actually has 2 walkers one for inside his home and one that he keeps out in the car. For that reason he would be covered in that regard. I just do not want him to have any accidents and fall with the cast. 12/28-Patient comes in 1 week out a day early for his appointment he was in the hospital for 2 days for uncontrolled hyperglycemia and required management of high blood sugars, with some adjustments in his home regimen of insulin, patient stubbed his right great toe today and put a Band-Aid on it, he is here for his left lateral foot ulcer and we are using silver alginate dressing, he was intact total contact cast with difficulty with balance to the extent that that had to be discontinued 04/16/2019 on evaluation today patient appears to be doing better with regard to the overall appearance of his wound size. This is good news. We had attempted a total contact cast but unfortunately he had some falling issues that the family contacted [place] about we took the  cast off we did not put it back on. Nonetheless it ended up that just a couple days later he was in the hospital due to elevations in his blood sugar which went to around 500 and even a little higher. Subsequently he was admitted to the hospital and was in the hospital for a total of 3 days and that included Christmas day. Fortunately there got his blood sugar under control he tells me that this is running about 200 now which is much better news. There does not appear to be any signs of active infection at this time. No fevers, chills, nausea, vomiting, or diarrhea. 04/30/2019 on evaluation today patient actually appears to  be doing slightly worse compared to previous as far as the overall size of his wound is concerned. There also is some need for sharp debridement today as well. Fortunately there is no evidence of active infection which is good news. No fevers, chills, nausea, vomiting, or diarrhea. 05/07/2019 upon evaluation today patient appears to be doing excellent in regard to his plantar foot ulcer. He has been tolerating the dressing changes without complication. Fortunately there is no signs of active infection at this time. No fevers, chills, nausea, vomiting, or diarrhea. He does tell me that he actually got in touch with [employer] and they feel like they can add diagnosis codes to his orders in order to get an external brace for him as opposed to the one that goes internal to issue. That would be excellent for his foot drop and I did tell him that if there is anything I need to fill out in that regard I will be more than happy to oblige and fill out what is needed for [employer]. 05/14/2019 upon evaluation today patient's original wound site actually appears to be doing much better which is good news. Fortunately there is no signs of active infection at this time. No fevers, chills, nausea, vomiting, or diarrhea. With that being said the patient unfortunately is continuing to have issues with rubbing he tells me that the felt slid on his foot although he did not realize it until today. Nonetheless I think this did cause a blister around the edge of the wound and he does an open wound that is slightly larger overall in measurement compared to last time mainly due to this blister. Other than that I feel like he is doing quite well. 05/21/2019 upon evaluation today patient appears to be doing excellent in regard to his wound at this time on the plantar foot. He is showing signs of improvement he does have some slough and biofilm noted on the surface of the wound this is going require some sharp debridement today as  well. Fortunately there is no evidence of active infection. No fevers, chills, nausea, vomiting, or diarrhea. Objective [name], [name] ([id number]) Constitutional Well-nourished and well-hydrated in no acute distress. Vitals Time Taken: 10:35 AM, Height: 71 in, Weight: 205 lbs, BMI: 28.6, Temperature: 97.8 F, Pulse: 102 bpm, Respiratory Rate: 16 breaths/min, Blood Pressure: 175/85 mmHg. General Notes: Patient states he has not taken BP meds today. PA spoke with patient regarding the importance of taking medications on time and following up with PCP if BP remains in the high range. Patient checks his BP daily. Respiratory normal breathing without difficulty. Psychiatric this patient is able to make decisions and demonstrates good insight into disease process. Alert and Oriented x 3. pleasant and cooperative. General Notes: Patient's wound bed currently showed signs of good granulation at this point  overall I do feel like he is doing well I did have to perform sharp debridement to remove some slough and necrotic debris from the surface of the wound he tolerated that today without complication post debridement the wound bed appears to be doing much better. Integumentary (Hair, Skin) Wound #5 status is Open. Original cause of wound was Pressure Injury. The wound is located on the Left,Lateral Foot. The wound measures 0.5cm length x 0.7cm width x 0.1cm depth; 0.275cm^2 area and 0.027cm^3 volume. There is Fat Layer (Subcutaneous Tissue) Exposed exposed. There is no tunneling or undermining noted. There is a medium amount of serosanguineous drainage noted. The wound margin is flat and intact. There is large (67-100%) pink granulation within the wound bed. There is a small (1-33%) amount of necrotic tissue within the wound bed including Adherent Slough. Assessment Active Problems ICD-10 Type 2 diabetes mellitus with foot ulcer Type 2 diabetes mellitus with diabetic  polyneuropathy Non-pressure chronic ulcer of other part of left foot with fat layer exposed Foot drop, left foot Procedures Wound #5 Pre-procedure diagnosis of Wound #5 is a Diabetic Wound/Ulcer of the Lower Extremity located on the Left,Lateral Foot .Severity of Tissue Pre Debridement is: Fat layer exposed. There was a Excisional Skin/Subcutaneous Tissue Debridement with a total area of 0.35 sq cm performed by [name], Cahlil Sattar E., [name]. With the following instrument(s): Forceps to remove Viable and Non-Viable tissue/material. Material removed includes Callus, Subcutaneous Tissue, Slough, and Biofilm after achieving pain control using Lidocaine. No specimens were taken. A time out was conducted at 10:59, prior to the start of the procedure. A Moderate amount of bleeding was controlled with Pressure. The procedure was tolerated well. Post Debridement Measurements: 0.5cm length x 0.7cm width x 0.2cm depth; 0.055cm^3 volume. Character of Wound/Ulcer Post Debridement is stable. Severity of Tissue Post Debridement is: Fat layer exposed. [name], [name] ([id number]) Post procedure Diagnosis Wound #5: Same as Pre-Procedure Plan Wound Cleansing: Wound #5 Left,Lateral Foot: May shower with protection. Anesthetic (add to Medication List): Wound #5 Left,Lateral Foot: Topical Lidocaine 4% cream applied to wound bed prior to debridement (In Clinic Only). Primary Wound Dressing: Wound #5 Left,Lateral Foot: Silver Collagen Secondary Dressing: Wound #5 Left,Lateral Foot: Conform/Kerlix - Rolled gauze Other - Felt to offload Follow-up Appointments: Wound #5 Left,Lateral Foot: Return Appointment in 1 week. Off-Loading: Turn and reposition every 2 hours - Elevate foot when sitting Additional Orders / Instructions: Wound #5 Left,Lateral Foot: Other: - Patient to follow-up with [employer] for adjustments to braces and shoes. 1. I would recommend currently that we continue with the current this includes  utilization of the silver collagen to the wound bed followed by felt to offload. He seems to have done very well with this. 2. I am also going to suggest that he continue to try to limit his walking is much as possible. 3. I did fill out the paperwork for [employer] today at sent the prescription last week but I did have the paperwork today from [employer] with the final authorization for his brace to help with his foot drop which I think will also be of great benefit. Again this is an external brace. We will see patient back for reevaluation in 1 week here in the clinic. If anything worsens or changes patient will contact our office for additional recommendations. Electronic Signature(s) Signed: 05/21/2019 12:57:03 PM By: Worthy Keeler [name] Entered By: Worthy Keeler on 05/21/2019 12:57:03 [name] ([id number]) -------------------------------------------------------------------------------- SuperBill Details Patient Name: [name] Date  of Service: 05/21/2019 Medical Record Number: [id number] Patient Account Number: [id number] Date of Birth/Sex: 1945/06/29 (74 y.o. M) Treating RN: Primary Care Provider: [name] Other Clinician: Referring Provider: [name] Treating Provider/Extender: Melburn Hake, Ezrie Bunyan Weeks in Treatment: 14 Diagnosis Coding ICD-10 Codes Code Description E11.621 Type 2 diabetes mellitus with foot ulcer E11.42 Type 2 diabetes mellitus with diabetic polyneuropathy L97.522 Non-pressure chronic ulcer of other part of left foot with fat layer exposed M21.372 Foot drop, left foot Facility Procedures CPT4 Code: [id number] Description: 27517 - DEB SUBQ TISSUE 20 SQ CM/< ICD-10 Diagnosis Description E11.42 Type 2 diabetes mellitus with diabetic polyneuropathy Modifier: Quantity: 1 Physician Procedures CPT4 Code: [id number] Description: 49675 - WC PHYS SUBQ TISS 20 SQ CM ICD-10 Diagnosis Description E11.42 Type 2 diabetes mellitus with diabetic  polyneuropathy Modifier: Quantity: 1 Electronic Signature(s) Signed: 05/21/2019 12:57:11 PM By: Worthy Keeler [name] Entered By: Worthy Keeler on 05/21/2019 12:57:10

## 2019-05-22 NOTE — Progress Notes (Signed)
FROILAN, MCLEAN ([id number]) Visit Report for 05/21/2019 Arrival Information Details Patient Name: [name], [name] Date of Service: 05/21/2019 10:30 AM Medical Record Number: [id number] Patient Account Number: [id number] Date of Birth/Sex: June 20, 1945 (73 y.o. M) Treating RN: Primary Care Jacobus Colvin: [name] Other Clinician: Referring Zyaire Mccleod: [name] Treating Mariah Gerstenberger/Extender: Melburn Hake, HOYT Weeks in Treatment: 14 Visit Information History Since Last Visit Added or deleted any medications: No Patient Arrived: Cane Any new allergies or adverse reactions: No Arrival Time: 10:38 Had a fall or experienced change in No Accompanied By: self activities of daily living that may affect Transfer Assistance: None risk of falls: Patient Identification Verified: Yes Signs or symptoms of abuse/neglect since last visito No Secondary Verification Process Completed: Yes Hospitalized since last visit: No Patient Has Alerts: Yes Implantable device outside of the clinic excluding No Patient Alerts: DMII cellular tissue based products placed in the center since last visit: Has Dressing in Place as Prescribed: Yes Pain Present Now: No Electronic Signature(s) Signed: 05/21/2019 4:25:10 PM By: Lorine Bears RCP, RRT, CHT Entered By: Lorine Bears on 05/21/2019 10:40:04 [name] ([id number]) -------------------------------------------------------------------------------- Encounter Discharge Information Details Patient Name: [name] Date of Service: 05/21/2019 10:30 AM Medical Record Number: [id number] Patient Account Number: [id number] Date of Birth/Sex: 11-28-1945 (73 y.o. M) Treating RN: Cornell Barman Primary Care Zenia Guest: [name] Other Clinician: Referring Kahli Mayon: [name] Treating Roxie Gueye/Extender: Melburn Hake, HOYT Weeks in Treatment: 14 Encounter Discharge Information Items Post Procedure Vitals Discharge  Condition: Stable Temperature (F): 97.8 Ambulatory Status: Ambulatory Pulse (bpm): 102 Discharge Destination: Home Respiratory Rate (breaths/min): 16 Transportation: Private Auto Blood Pressure (mmHg): 175/85 Accompanied By: self Schedule Follow-up Appointment: Yes Clinical Summary of Care: Electronic Signature(s) Signed: 05/22/2019 4:33:39 PM By: Gretta Cool, BSN, RN, CWS, Kim RN, BSN Entered By: Gretta Cool, BSN, RN, CWS, Kim on 05/21/2019 11:04:38 [name] ([id number]) -------------------------------------------------------------------------------- Lower Extremity Assessment Details Patient Name: [name] Date of Service: 05/21/2019 10:30 AM Medical Record Number: [id number] Patient Account Number: [id number] Date of Birth/Sex: Aug 14, 1945 (73 y.o. M) Treating RN: Army Melia Primary Care Aneliz Carbary: [name] Other Clinician: Referring Christin Moline: [name] Treating Simpson Paulos/Extender: [name], HOYT Weeks in Treatment: 14 Edema Assessment Assessed: [Left: No] [Right: No] Edema: [Left: N] [Right: o] Vascular Assessment Pulses: Dorsalis Pedis Palpable: [Left:Yes] Electronic Signature(s) Signed: 05/21/2019 10:48:43 AM By: Army Melia Entered By: Army Melia on 05/21/2019 10:46:19 [name] ([id number]) -------------------------------------------------------------------------------- Multi Wound Chart Details Patient Name: [name] Date of Service: 05/21/2019 10:30 AM Medical Record Number: [id number] Patient Account Number: [id number] Date of Birth/Sex: 09-Aug-1945 (73 y.o. M) Treating RN: Cornell Barman Primary Care Azarion Hove: [name] Other Clinician: Referring Karla Vines: [name] Treating Malini Flemings/Extender: Melburn Hake, HOYT Weeks in Treatment: 14 Vital Signs Height(in): 71 Pulse(bpm): 102 Weight(lbs): 205 Blood Pressure(mmHg): 175/85 Body Mass Index(BMI): 29 Temperature(F): 97.8 Respiratory Rate 16 (breaths/min): Photos:  [N/A:N/A] Wound Location: Left Foot - Lateral N/A N/A Wounding Event: Pressure Injury N/A N/A Primary Etiology: Diabetic Wound/Ulcer of the N/A N/A Lower Extremity Comorbid History: Cataracts, Middle ear N/A N/A problems, Sleep Apnea, Hypertension, Type II Diabetes, Gout, Osteoarthritis, Neuropathy Date Acquired: 01/19/2019 N/A N/A Weeks of Treatment: 14 N/A N/A Wound Status: Open N/A N/A Measurements L x W x D 0.5x0.7x0.1 N/A N/A (cm) Area (cm) : 0.275 N/A N/A Volume (cm) : 0.027 N/A N/A % Reduction in Area: 0.00% N/A N/A % Reduction in Volume: 67.10% N/A N/A Classification: Grade 2 N/A N/A Exudate Amount: Medium N/A N/A Exudate Type: Serosanguineous  N/A N/A Exudate Color: red, brown N/A N/A Wound Margin: Flat and Intact N/A N/A Granulation Amount: Large (67-100%) N/A N/A Granulation Quality: Pink N/A N/A Necrotic Amount: Small (1-33%) N/A N/A Exposed Structures: Fat Layer (Subcutaneous N/A N/A Tissue) Exposed: Yes Fascia: No [name], [name] ([id number]) Tendon: No Muscle: No Joint: No Bone: No Epithelialization: None N/A N/A Treatment Notes Electronic Signature(s) Signed: 05/22/2019 4:33:39 PM By: Gretta Cool, BSN, RN, CWS, Kim RN, BSN Entered By: Gretta Cool, BSN, RN, CWS, Kim on 05/21/2019 11:01:05 [name] ([id number]) -------------------------------------------------------------------------------- Multi-Disciplinary Care Plan Details Patient Name: [name] Date of Service: 05/21/2019 10:30 AM Medical Record Number: [id number] Patient Account Number: [id number] Date of Birth/Sex: [birth year] (73 y.o. M) Treating RN: Cornell Barman Primary Care Vincenzina Jagoda: [name] Other Clinician: Referring Enna Warwick: [name] Treating Ebonique Hallstrom/Extender: Melburn Hake, HOYT Weeks in Treatment: 14 Active Inactive Pressure Nursing Diagnoses: Knowledge deficit related to management of pressures ulcers Goals: Patient will remain free from development of additional  pressure ulcers Date Initiated: 04/30/2019 Target Resolution Date: 05/14/2019 Goal Status: Active Patient/caregiver will verbalize understanding of pressure ulcer management Date Initiated: 04/30/2019 Target Resolution Date: 05/14/2019 Goal Status: Active Interventions: Assess: immobility, friction, shearing, incontinence upon admission and as needed Treatment Activities: Patient referred for pressure reduction/relief devices : 04/30/2019 Notes: Wound/Skin Impairment Nursing Diagnoses: Impaired tissue integrity Goals: Ulcer/skin breakdown will have a volume reduction of 30% by week 4 Date Initiated: 04/30/2019 Target Resolution Date: 05/31/2019 Goal Status: Active Interventions: Assess patient/caregiver ability to obtain necessary supplies Assess ulceration(s) every visit Treatment Activities: Referred to DME Latresha Yahr for dressing supplies : 04/30/2019 Skin care regimen initiated : 04/30/2019 Topical wound management initiated : 04/30/2019 Notes: [name], [name] ([id number]) Electronic Signature(s) Signed: 05/22/2019 4:33:39 PM By: Gretta Cool, BSN, RN, CWS, Kim RN, BSN Entered By: Gretta Cool, BSN, RN, CWS, Kim on 05/21/2019 11:00:56 [name] ([id number]) -------------------------------------------------------------------------------- Pain Assessment Details Patient Name: [name] Date of Service: 05/21/2019 10:30 AM Medical Record Number: [id number] Patient Account Number: [id number] Date of Birth/Sex: Aug 30, 1945 (73 y.o. M) Treating RN: Primary Care Matha Masse: [name] Other Clinician: Referring Floreen Teegarden: [name] Treating Una Yeomans/Extender: Melburn Hake, HOYT Weeks in Treatment: 14 Active Problems Location of Pain Severity and Description of Pain Patient Has Paino No Site Locations Pain Management and Medication Current Pain Management: Electronic Signature(s) Signed: 05/21/2019 4:25:10 PM By: Lorine Bears RCP, RRT, CHT Entered By: Lorine Bears on 05/21/2019 10:40:13 [name] ([id number]) -------------------------------------------------------------------------------- Patient/Caregiver Education Details Patient Name: [name] Date of Service: 05/21/2019 10:30 AM Medical Record Number: [id number] Patient Account Number: [id number] Date of Birth/Gender: 11-13-1945 (73 y.o. M) Treating RN: Cornell Barman Primary Care Physician: [name] Other Clinician: Referring Physician: [name] Treating Physician/Extender: Sharalyn Ink in Treatment: 14 Education Assessment Education Provided To: Patient Education Topics Provided Wound Debridement: Handouts: Wound Debridement Methods: Demonstration, Explain/Verbal Responses: State content correctly Wound/Skin Impairment: Handouts: Caring for Your Ulcer Methods: Demonstration, Explain/Verbal Responses: State content correctly Electronic Signature(s) Signed: 05/22/2019 4:33:39 PM By: Gretta Cool, BSN, RN, CWS, Kim RN, BSN Entered By: Gretta Cool, BSN, RN, CWS, Kim on 05/21/2019 11:03:27 [name] ([id number]) -------------------------------------------------------------------------------- Wound Assessment Details Patient Name: [name] Date of Service: 05/21/2019 10:30 AM Medical Record Number: [id number] Patient Account Number: [id number] Date of Birth/Sex: 1946/01/17 (73 y.o. M) Treating RN: Army Melia Primary Care Hebah Bogosian: [name] Other Clinician: Referring Alianys Chacko: [name] Treating Malynn Lucy/Extender: [name], HOYT Weeks in Treatment: 14 Wound Status Wound Number: 5 Primary Diabetic Wound/Ulcer of the  Lower Extremity Etiology: Wound Location: Left Foot - Lateral Wound Open Wounding Event: Pressure Injury Status: Date Acquired: 01/19/2019 Comorbid Cataracts, Middle ear problems, Sleep Apnea, Weeks Of Treatment: 14 History: Hypertension, Type II Diabetes, Gout, Clustered Wound: No Osteoarthritis,  Neuropathy Photos Wound Measurements Length: (cm) 0.5 % Reduction i Width: (cm) 0.7 % Reduction i Depth: (cm) 0.1 Epithelializa Area: (cm) 0.275 Tunneling: Volume: (cm) 0.027 Undermining: n Area: 0% n Volume: 67.1% tion: None No No Wound Description Classification: Grade 2 Foul Odor Aft Wound Margin: Flat and Intact Slough/Fibrin Exudate Amount: Medium Exudate Type: Serosanguineous Exudate Color: red, brown er Cleansing: No o Yes Wound Bed Granulation Amount: Large (67-100%) Exposed Structure Granulation Quality: Pink Fascia Exposed: No Necrotic Amount: Small (1-33%) Fat Layer (Subcutaneous Tissue) Exposed: Yes Necrotic Quality: Adherent Slough Tendon Exposed: No Muscle Exposed: No Joint Exposed: No Bone Exposed: No Treatment Notes [name], [name] ([id number]) Wound #5 (Left, Lateral Foot) Notes Silver collagen, Felt for offloading, conform Electronic Signature(s) Signed: 05/21/2019 10:48:43 AM By: Army Melia Entered By: Army Melia on 05/21/2019 10:44:57 [name] ([id number]) -------------------------------------------------------------------------------- Vitals Details Patient Name: [name] Date of Service: 05/21/2019 10:30 AM Medical Record Number: [id number] Patient Account Number: [id number] Date of Birth/Sex: 02/12/1946 (73 y.o. M) Treating RN: Primary Care Blayklee Mable: [name] Other Clinician: Referring Devanee Pomplun: [name] Treating Judah Carchi/Extender: [name], HOYT Weeks in Treatment: 14 Vital Signs Time Taken: 10:35 Temperature (F): 97.8 Height (in): 71 Pulse (bpm): 102 Weight (lbs): 205 Respiratory Rate (breaths/min): 16 Body Mass Index (BMI): 28.6 Blood Pressure (mmHg): 175/85 Reference Range: 80 - 120 mg / dl Notes Patient states he has not taken BP meds today. PA spoke with patient regarding the importance of taking medications on time and following up with PCP if BP remains in the high range. Patient checks  his BP daily. Electronic Signature(s) Signed: 05/22/2019 4:33:39 PM By: Gretta Cool, BSN, RN, CWS, Kim RN, BSN Entered By: Gretta Cool, BSN, RN, CWS, Kim on 05/21/2019 11:00:48

## 2019-05-28 ENCOUNTER — Encounter: Payer: Medicare Other | Admitting: Physician Assistant

## 2019-05-28 ENCOUNTER — Other Ambulatory Visit: Payer: Self-pay

## 2019-05-28 DIAGNOSIS — L97522 Non-pressure chronic ulcer of other part of left foot with fat layer exposed: Secondary | ICD-10-CM | POA: Diagnosis not present

## 2019-05-28 DIAGNOSIS — Z87891 Personal history of nicotine dependence: Secondary | ICD-10-CM | POA: Diagnosis not present

## 2019-05-28 DIAGNOSIS — Z794 Long term (current) use of insulin: Secondary | ICD-10-CM | POA: Diagnosis not present

## 2019-05-28 DIAGNOSIS — Z833 Family history of diabetes mellitus: Secondary | ICD-10-CM | POA: Diagnosis not present

## 2019-05-28 DIAGNOSIS — E1142 Type 2 diabetes mellitus with diabetic polyneuropathy: Secondary | ICD-10-CM | POA: Diagnosis not present

## 2019-05-28 DIAGNOSIS — Z8249 Family history of ischemic heart disease and other diseases of the circulatory system: Secondary | ICD-10-CM | POA: Diagnosis not present

## 2019-05-28 DIAGNOSIS — L97529 Non-pressure chronic ulcer of other part of left foot with unspecified severity: Secondary | ICD-10-CM | POA: Diagnosis not present

## 2019-05-28 DIAGNOSIS — M109 Gout, unspecified: Secondary | ICD-10-CM | POA: Diagnosis not present

## 2019-05-28 DIAGNOSIS — M21372 Foot drop, left foot: Secondary | ICD-10-CM | POA: Diagnosis not present

## 2019-05-28 DIAGNOSIS — Z823 Family history of stroke: Secondary | ICD-10-CM | POA: Diagnosis not present

## 2019-05-28 DIAGNOSIS — E1151 Type 2 diabetes mellitus with diabetic peripheral angiopathy without gangrene: Secondary | ICD-10-CM | POA: Diagnosis not present

## 2019-05-28 DIAGNOSIS — E11621 Type 2 diabetes mellitus with foot ulcer: Secondary | ICD-10-CM | POA: Diagnosis not present

## 2019-05-28 DIAGNOSIS — I252 Old myocardial infarction: Secondary | ICD-10-CM | POA: Diagnosis not present

## 2019-05-28 DIAGNOSIS — Z809 Family history of malignant neoplasm, unspecified: Secondary | ICD-10-CM | POA: Diagnosis not present

## 2019-05-28 DIAGNOSIS — I10 Essential (primary) hypertension: Secondary | ICD-10-CM | POA: Diagnosis not present

## 2019-05-28 DIAGNOSIS — M1990 Unspecified osteoarthritis, unspecified site: Secondary | ICD-10-CM | POA: Diagnosis not present

## 2019-05-28 NOTE — Progress Notes (Addendum)
JAHSIR, [name] ([id number]) Visit Report for 05/28/2019 Arrival Information Details Patient Name: [name], [name] Date of Service: 05/28/2019 10:45 AM Medical Record Number: [id number] Patient Account Number: [id number] Date of Birth/Sex: 06/17/1945 (74 y.o. M) Treating RN: Army Melia Primary Care Aryaa Bunting: [name] Other Clinician: Referring Jaelin Devincentis: [name] Treating Elif Yonts/Extender: Melburn Hake, HOYT Weeks in Treatment: 15 Visit Information History Since Last Visit Added or deleted any medications: No Patient Arrived: Cane Any new allergies or adverse reactions: No Arrival Time: 11:05 Had a fall or experienced change in No Accompanied By: self activities of daily living that may affect Transfer Assistance: None risk of falls: Patient Has Alerts: Yes Signs or symptoms of abuse/neglect since last visito No Patient Alerts: DMII Hospitalized since last visit: No Has Dressing in Place as Prescribed: Yes Pain Present Now: No Electronic Signature(s) Signed: 05/28/2019 4:18:43 PM By: Army Melia Entered By: Army Melia on 05/28/2019 [place]ible[place], Lima. ([id number]) -------------------------------------------------------------------------------- Encounter Discharge Information Details Patient Name: [name] Date of Service: 05/28/2019 10:45 AM Medical Record Number: [id number] Patient Account Number: [id number] Date of Birth/Sex: 1945-10-10 (73 y.o. M) Treating RN: Cornell Barman Primary Care Lianne Carreto: [name] Other Clinician: Referring Esmay Amspacher: [name] Treating Kambry Takacs/Extender: Melburn Hake, HOYT Weeks in Treatment: 15 Encounter Discharge Information Items Post Procedure Vitals Discharge Condition: Stable Temperature (F): 98.0 Ambulatory Status: Ambulatory Pulse (bpm): 105 Discharge Destination: Home Respiratory Rate (breaths/min): 16 Transportation: Private Auto Blood Pressure (mmHg): 100/85 Accompanied By: self Schedule  Follow-up Appointment: No Clinical Summary of Care: Electronic Signature(s) Signed: 05/28/2019 5:08:08 PM By: Gretta Cool, BSN, RN, CWS, Kim RN, BSN Entered By: Gretta Cool, BSN, RN, CWS, Kim on 05/28/2019 11:41:06 [name] ([id number]) -------------------------------------------------------------------------------- Lower Extremity Assessment Details Patient Name: [name] Date of Service: 05/28/2019 10:45 AM Medical Record Number: [id number] Patient Account Number: [id number] Date of Birth/Sex: [birth year] (73 y.o. M) Treating RN: Army Melia Primary Care Jordyan Hardiman: [name] Other Clinician: Referring Meosha Castanon: [name] Treating Ronne Savoia/Extender: [name], HOYT Weeks in Treatment: 15 Edema Assessment Assessed: [Left: No] [Right: No] Edema: [Left: N] [Right: o] Vascular Assessment Pulses: Dorsalis Pedis Palpable: [Left:Yes] Electronic Signature(s) Signed: 05/28/2019 4:18:43 PM By: Army Melia Entered By: Army Melia on 05/28/2019 [name], [place]. ([id number]) -------------------------------------------------------------------------------- Multi Wound Chart Details Patient Name: [name] Date of Service: 05/28/2019 10:45 AM Medical Record Number: [id number] Patient Account Number: [id number] Date of Birth/Sex: April 07, 1946 (73 y.o. M) Treating RN: Cornell Barman Primary Care Sonja Manseau: [name] Other Clinician: Referring Crystle Carelli: [name] Treating Annaleese Guier/Extender: Melburn Hake, HOYT Weeks in Treatment: 15 Vital Signs Height(in): 71 Pulse(bpm): 105 Weight(lbs): 205 Blood Pressure(mmHg): 160/85 Body Mass Index(BMI): 29 Temperature(F): 98.0 Respiratory Rate 16 (breaths/min): Photos: [N/A:N/A] Wound Location: Left Foot - Lateral N/A N/A Wounding Event: Pressure Injury N/A N/A Primary Etiology: Diabetic Wound/Ulcer of the N/A N/A Lower Extremity Comorbid History: Cataracts, Middle ear N/A N/A problems, Sleep  Apnea, Hypertension, Type II Diabetes, Gout, Osteoarthritis, Neuropathy Date Acquired: 01/19/2019 N/A N/A Weeks of Treatment: 15 N/A N/A Wound Status: Open N/A N/A Measurements L x W x D 0.5x0.6x0.1 N/A N/A (cm) Area (cm) : 0.236 N/A N/A Volume (cm) : 0.024 N/A N/A % Reduction in Area: 14.20% N/A N/A % Reduction in Volume: 70.70% N/A N/A Classification: Grade 2 N/A N/A Exudate Amount: Medium N/A N/A Exudate Type: Serosanguineous N/A N/A Exudate Color: red, brown N/A N/A Wound Margin: Flat and Intact N/A N/A Granulation Amount: Medium (34-66%) N/A N/A Granulation Quality: Pink N/A N/A Necrotic Amount: Medium (34-66%) N/A  N/A Exposed Structures: Fat Layer (Subcutaneous N/A N/A Tissue) Exposed: Yes Fascia: No UTAH, DELAUDER ([id number]) Tendon: No Muscle: No Joint: No Bone: No Epithelialization: None N/A N/A Treatment Notes Electronic Signature(s) Signed: 05/28/2019 5:08:08 PM By: Gretta Cool, BSN, RN, CWS, Kim RN, BSN Entered By: Gretta Cool, BSN, RN, CWS, Kim on 05/28/2019 11:33:17 ARUL, FARABEE ([id number]) -------------------------------------------------------------------------------- Multi-Disciplinary Care Plan Details Patient Name: [name] Date of Service: 05/28/2019 10:45 AM Medical Record Number: [id number] Patient Account Number: [id number] Date of Birth/Sex: [birth year] (73 y.o. M) Treating RN: Cornell Barman Primary Care Cheryl Chay: [name] Other Clinician: Referring Estil Vallee: [name] Treating Romond Pipkins/Extender: Melburn Hake, HOYT Weeks in Treatment: 15 Active Inactive Pressure Nursing Diagnoses: Knowledge deficit related to management of pressures ulcers Goals: Patient will remain free from development of additional pressure ulcers Date Initiated: 04/30/2019 Target Resolution Date: 05/14/2019 Goal Status: Active Patient/caregiver will verbalize understanding of pressure ulcer management Date Initiated: 04/30/2019 Target Resolution Date:  05/14/2019 Goal Status: Active Interventions: Assess: immobility, friction, shearing, incontinence upon admission and as needed Treatment Activities: Patient referred for pressure reduction/relief devices : 04/30/2019 Notes: Wound/Skin Impairment Nursing Diagnoses: Impaired tissue integrity Goals: Ulcer/skin breakdown will have a volume reduction of 30% by week 4 Date Initiated: 04/30/2019 Target Resolution Date: 05/31/2019 Goal Status: Active Interventions: Assess patient/caregiver ability to obtain necessary supplies Assess ulceration(s) every visit Treatment Activities: Referred to DME Claressa Hughley for dressing supplies : 04/30/2019 Skin care regimen initiated : 04/30/2019 Topical wound management initiated : 04/30/2019 Notes: RAE, SUTCLIFFE ([id number]) Electronic Signature(s) Signed: 05/28/2019 5:08:08 PM By: Gretta Cool, BSN, RN, CWS, Kim RN, BSN Entered By: Gretta Cool, BSN, RN, CWS, Kim on 05/28/2019 11:33:09 [name] ([id number]) -------------------------------------------------------------------------------- Pain Assessment Details Patient Name: [name] Date of Service: 05/28/2019 10:45 AM Medical Record Number: [id number] Patient Account Number: [id number] Date of Birth/Sex: [birth year] (73 y.o. M) Treating RN: Army Melia Primary Care Dnya Hickle: [name] Other Clinician: Referring Aliece Honold: [name] Treating Leighanna Kirn/Extender: Melburn Hake, HOYT Weeks in Treatment: 15 Active Problems Location of Pain Severity and Description of Pain Patient Has Paino No Site Locations Pain Management and Medication Current Pain Management: Electronic Signature(s) Signed: 05/28/2019 4:18:43 PM By: Army Melia Entered By: Army Melia on 05/28/2019 [place], [place] ([id number]) -------------------------------------------------------------------------------- Patient/Caregiver Education Details Patient Name: [name] Date of Service: 05/28/2019 10:45  AM Medical Record Number: [id number] Patient Account Number: [id number] Date of Birth/Gender: 13-Dec-1945 (73 y.o. M) Treating RN: Cornell Barman Primary Care Physician: [name] Other Clinician: Referring Physician: [name] Treating Physician/Extender: Sharalyn Ink in Treatment: 15 Education Assessment Education Provided To: Patient Education Topics Provided Offloading: Handouts: What is Offloadingo, Other: [employer] working on brace Methods: Demonstration, Explain/Verbal Responses: State content correctly [employer]) Signed: 05/28/2019 5:08:08 PM By: Gretta Cool, BSN, RN, CWS, Kim RN, BSN Entered By: Gretta Cool, BSN, RN, CWS, Kim on 05/28/2019 11:38:11 [name] ([id number]) -------------------------------------------------------------------------------- Wound Assessment Details Patient Name: [name] Date of Service: 05/28/2019 10:45 AM Medical Record Number: [id number] Patient Account Number: [id number] Date of Birth/Sex: 1945/06/18 (73 y.o. M) Treating RN: Army Melia Primary Care Abem Shaddix: [name] Other Clinician: Referring Blen Ransome: [name] Treating Priyah Schmuck/Extender: [name], HOYT Weeks in Treatment: 15 Wound Status Wound Number: 5 Primary Diabetic Wound/Ulcer of the Lower Extremity Etiology: Wound Location: Left Foot - Lateral Wound Open Wounding Event: Pressure Injury Status: Date Acquired: 01/19/2019 Comorbid Cataracts, Middle ear problems, Sleep Apnea, Weeks Of Treatment: 15 History: Hypertension, Type II Diabetes, Gout, Clustered Wound: No Osteoarthritis, Neuropathy Photos Wound  Measurements Length: (cm) 0.5 % Reduction in Width: (cm) 0.6 % Reduction in Depth: (cm) 0.1 Epithelializat Area: (cm) 0.236 Tunneling: Volume: (cm) 0.024 Undermining: Area: 14.2% Volume: 70.7% ion: None No No Wound Description Classification: Grade 2 Foul Odor Aft Wound Margin: Flat and Intact Slough/Fibrin Exudate  Amount: Medium Exudate Type: Serosanguineous Exudate Color: red, brown er Cleansing: No o Yes Wound Bed Granulation Amount: Medium (34-66%) Exposed Structure Granulation Quality: Pink Fascia Exposed: No Necrotic Amount: Medium (34-66%) Fat Layer (Subcutaneous Tissue) Exposed: Yes Necrotic Quality: Adherent Slough Tendon Exposed: No Muscle Exposed: No Joint Exposed: No Bone Exposed: No Treatment Notes CALAN, DOREN ([id number]) Wound #5 (Left, Lateral Foot) Notes Silver collagen, Felt for offloading, conform Electronic Signature(s) Signed: 05/28/2019 4:18:43 PM By: Army Melia Entered By: Army Melia on 05/28/2019 11:09:29 [name] ([id number]) -------------------------------------------------------------------------------- Vitals Details Patient Name: [name] Date of Service: 05/28/2019 10:45 AM Medical Record Number: [id number] Patient Account Number: [id number] Date of Birth/Sex: 01-03-1946 (73 y.o. M) Treating RN: Army Melia Primary Care Reizel Calzada: [name] Other Clinician: Referring Brnadon Eoff: [name] Treating Ophia Shamoon/Extender: [name], HOYT Weeks in Treatment: 15 Vital Signs Time Taken: 11:06 Temperature (F): 98.0 Height (in): 71 Pulse (bpm): 105 Weight (lbs): 205 Respiratory Rate (breaths/min): 16 Body Mass Index (BMI): 28.6 Blood Pressure (mmHg): 160/85 Reference Range: 80 - 120 mg / dl Electronic Signature(s) Signed: 05/28/2019 4:18:43 PM By: Army Melia Entered By: Army Melia on 05/28/2019 11:06:45

## 2019-05-28 NOTE — Progress Notes (Addendum)
SIMON, LLAMAS ([id number]) Visit Report for 05/28/2019 Chief Complaint Document Details Patient Name: [name], [name] Date of Service: 05/28/2019 10:45 AM Medical Record Number: [id number] Patient Account Number: [id number] Date of Birth/Sex: [birth year] (74 y.o. M) Treating RN: Cornell Barman Primary Care Provider: [name] Other Clinician: Referring Provider: [name] Treating Provider/Extender: Melburn Hake, Marqueze Ramcharan Weeks in Treatment: 15 Information Obtained from: Patient Chief Complaint Left dorsal foot ulcer 02/09/2019; patient is here for review of the wound on the left lateral foot roughly at the level of the base of the fifth metatarsal Electronic Signature(s) Signed: 05/28/2019 11:04:49 AM By: Worthy Keeler [name] Entered By: Worthy Keeler on 05/28/2019 11:04:49 [name] ([id number]) -------------------------------------------------------------------------------- Debridement Details Patient Name: [name] Date of Service: 05/28/2019 10:45 AM Medical Record Number: [id number] Patient Account Number: [id number] Date of Birth/Sex: 1945-06-14 (73 y.o. M) Treating RN: Cornell Barman Primary Care Provider: [name] Other Clinician: Referring Provider: [name] Treating Provider/Extender: Melburn Hake, Clydine Parkison Weeks in Treatment: 15 Debridement Performed for Wound #5 Left,Lateral Foot Assessment: Performed By: Physician [name], Evea Sheek E., [name] Debridement Type: Debridement Severity of Tissue Pre Fat layer exposed Debridement: Level of Consciousness (Pre- Awake and Alert procedure): Pre-procedure Verification/Time Yes - 11:30 Out Taken: Start Time: 11:31 Pain Control: Lidocaine Total Area Debrided (L x W): 0.9 (cm) x 0.6 (cm) = 0.54 (cm) Tissue and other material Viable, Non-Viable, Slough, Subcutaneous, Skin: Dermis , Slough debrided: Level: Skin/Subcutaneous Tissue Debridement Description: Excisional Instrument: Curette Bleeding:  Minimum Hemostasis Achieved: Pressure End Time: 11:34 Response to Treatment: Procedure was tolerated well Level of Consciousness Awake and Alert (Post-procedure): Post Debridement Measurements of Total Wound Length: (cm) 0.9 Width: (cm) 0.6 Depth: (cm) 0.2 Volume: (cm) 0.085 Character of Wound/Ulcer Post Debridement: Stable Severity of Tissue Post Debridement: Fat layer exposed Post Procedure Diagnosis Same as Pre-procedure Electronic Signature(s) Signed: 05/28/2019 4:43:11 PM By: Worthy Keeler [name] Signed: 05/28/2019 5:08:08 PM By: Gretta Cool, BSN, RN, CWS, Kim RN, BSN Entered By: Gretta Cool, BSN, RN, CWS, Kim on 05/28/2019 11:35:09 [name] ([id number]) -------------------------------------------------------------------------------- HPI Details Patient Name: [name] Date of Service: 05/28/2019 10:45 AM Medical Record Number: [id number] Patient Account Number: [id number] Date of Birth/Sex: 1946/04/04 (73 y.o. M) Treating RN: Cornell Barman Primary Care Provider: [name] Other Clinician: Referring Provider: [name] Treating Provider/Extender: Melburn Hake, Macario Shear Weeks in Treatment: 15 History of Present Illness HPI Description: 10/04/16 on evaluation today patient presents for initial evaluation concerning a laceration of the right wrist and hand on the bowler aspect. He tells me that he fell into glass following a medication change in regard to his diabetes. He initially went to [facility] where she was sutured and subsequently his daughter who is a Marine scientist removed the teachers at the appropriate time. Unfortunately the wound has done poorly and dehissed which has caused him some trouble including infection. He has been on antibiotics both topically and orally though the wound continues to show signs of necrotic tissue according to notes which is what he was sent to [place] for evaluation concerning. He does have diabetes and unfortunately this is severely  uncontrolled. He does see an endocrinologist and is working on this. 10/11/2016 -- the patient's notes from the ER were reviewed and he had his suturing done on 09/17/2016 and had necrotic skin flaps which had to be trimmed the last visit he was here. He has managed to get his Santyl ointment and has been using this appropriately. 10/21/16 patient's wound appears  to be doing very well on evaluation today. He still has some Slough covering the wound bed but this is nowhere near as severe as when i saw this initially two weeks ago nor even my review of his pictures last week. I'm pleased with how this is progressing. Patient's wound over the right form appears to be doing fairly well on evaluation today. There is no evidence of infection and he has a small area at the crease of where she is wrist extends and flexes that is still open and appears to be having difficulty due to the fact that it is cracking open. I think this is something that may be controlled with moisture control that is keeping the wound a little bit more moist. Nonetheless this is a tiny region compared to the initial wound and overall appears to be doing very well. No fevers, chills, nausea, or vomiting noted at this time. 11/25/16 on evaluation today patient appears to be doing well in regard to his right wrist ulcer. In fact this appears to be completely healed he is having no discomfort. He is pleased with how this is progressed. Readmission: 06/24/17 on evaluation today patient appears back in our clinic regarding issues that he has been having with his right ankle on the lateral malleolus as well is in the dorsal surface of his left foot. Both have been going on for several weeks unfortunately. He tells me that initially this was being managed by Dr. Edilia Bo and I did review the notes from Dr. Edilia Bo in epic. Patient was placed on amoxicillin initially and then subsequently Keflex following. He has been using triple  antibiotic ointment over-the-counter along with a Band-Aid for the wound currently. He did not have any Santyl remaining. His most recent hemoglobin A1c which was just two days ago was 10.3. Fortunately he seems to have excellent blood flow in the bilateral lower extremities with his left ABI being 0.95 in the right ABI 1.01. Patient does have some pain in regard to each of these ulcerations but he tells me it's not nearly as severe as the hand wound that I help treat earlier over the summer. This was in 2018. 07/15/17 on evaluation today patient's lower surety ulcer is actually both appear to be doing fairly well at this point. He does have a little bit of skin overlapping the ulcer on the right lateral ankle this was debrided away today just with saline and gauze without complication patient had no discomfort or pain. Otherwise patient's left foot ulcer does appear to be showing signs of improvement he does seem to have a right great toe. Nikki and noted at this point in time. I think that this does need to be managed at this point. No fevers, chills, nausea, or vomiting noted at this time. 07/22/17 on evaluation today patient's wounds in regard to his lower extremities appear to be doing somewhat better. His right ankle wound in fact appears to likely be completely closed. The left dorsal foot ulcer though still open does appear to be a little bit smaller and does also seem to be making good progress. He has not been having problems with the treatment at this point in the general he does not seem to show as much erythema surrounding the wound bed which is good news. He is not [name], Dustin E. ([id number]) having significant discomfort. 08/12/17 on evaluation today patients wounds of both appeared to be doing better in fact they both appear to be completely healed. Overall I'm  very pleased with how things have progressed over the past week even more than what I expected. Obviously this is good news  and he is happy about this. He did have a fall when going to his orthopedic doctor recently fortunately he does not appear to have injured anything severely but nonetheless does have a couple of bumps and bruises nothing significant at this time but he needs to be seen and wound care for. He may have broken his left wrist he is following up with orthopedics in that regard. Readmission: 10/24/17 on evaluation today patient presents for reevaluation after having been discharged Aug 12, 2017. His wound was healed at that point although he states that it has been somewhat discolored since that time. He has not experienced the drainage as far as the wound is concerned but the fact that it was still discolored been greater than two months after the fact made him want to come in for reevaluation. Upon inspection today the patient has no pain although he does have neuropathy. He does have a discolored region of the dorsal surface of the left foot which was the site where the ulcer was that we previously treated. The good news is he does not again have any discomfort although obviously I do believe this may be some scar tissue and just discoloration in that regard. Again he hasn't had any drainage or discharge since I last saw him and at this point I see no evidence of that either this area shows complete epithelialization. In general I do not feel like there's any significant issue at this point in that regard. 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 He has been in this clinic before in 2018 with a laceration on his right hand and then again in 2019 with an area over the right lateral malleolus. 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 He states that the wound is been there for the last week or 2. It is been uncomfortable to walk on. The  man is an active man having to look after an elderly mother. Past medical history, type 2 diabetes with polyneuropathy with a recent hemoglobin A1c in September 2011 0.3, lumbar disc disease, hypergonadism., Left foot drop and gout. We did not repeat his ABIs in the clinic quoting values from July 2019 of 0.95 and 1.01. He is not felt to have significant PAD 02/19/2019 on evaluation today patient actually appears to be doing better with regard to his foot wound based on what I am seeing currently. He was seen by Dr. Dellia Nims on readmission back into the clinic last week when I was off. The good news is the patient has been tolerating the dressing changes without any complication in fact has not really had much in the way of drainage. He does have an AFO brace which she believes is actually pushing on this area as well and has contributed to the issue. Fortunately there is no signs of active infection at this point. His ABIs appear to be well. 03/05/2019 on evaluation today patient appears to be doing well in regard to his foot ulcer. He has been tolerating the dressing changes without complication. Fortunately there is no signs of active infection. The wound is measuring somewhat smaller and overall I feel like he is making good progress at this point. 03/12/2019 on evaluation today  patient actually appears to be doing quite well with regard to his foot ulcer. The wound is looking better the measurement is not significantly better at this point unfortunately but nonetheless again the overall appearance is improving I think he is making some progress. I still think the big issue here is foot drop and again the patient wants to see if potentially he could get a brace to help with this. Subsequently I think that we can definitely make a referral to [facility] to see if they can help [place] in this regard. He is in agreement with that plan. 03/26/2019 on evaluation today patient appears to be doing more  poorly in regard to his foot ulcer at this time. He did see podiatry in order to see about getting a brace but unfortunately he tells me that Medicare is not likely to pay for one for him as he had 1 I guess 8 months ago. However this one that apparently goes in his shoes that does not seem to be working out well for him. Unfortunately there are signs of active infection at this time. No fevers, chills, nausea, vomiting, or diarrhea. Systemically. The patient has erythema on the dorsal surface of his foot that tracks from the lateral portion of his foot and again he does have a blister around the wound as well which is worse compared to previous. Overall I am concerned. 04/02/2019 on evaluation today patient appears to be doing about the same with regard to his wound. Unfortunately there is no signs of significant improvement although I do think there is no signs of infection which is good news. His drainage is also [name], Dustin E. ([id number]) slowed down tremendously. I think at this point he would benefit from the initiation of a total contact cast. The only concern I have is that over his balance. I explained to the patient that if we were to initiate the cast I think it would be greatly beneficial for him but he would need to ensure not to walk at all unless he uses a walker he actually has 2 walkers one for inside his home and one that he keeps out in the car. For that reason he would be covered in that regard. I just do not want him to have any accidents and fall with the cast. 12/28-Patient comes in 1 week out a day early for his appointment he was in the hospital for 2 days for uncontrolled hyperglycemia and required management of high blood sugars, with some adjustments in his home regimen of insulin, patient stubbed his right great toe today and put a Band-Aid on it, he is here for his left lateral foot ulcer and we are using silver alginate dressing, he was intact total contact cast  with difficulty with balance to the extent that that had to be discontinued 04/16/2019 on evaluation today patient appears to be doing better with regard to the overall appearance of his wound size. This is good news. We had attempted a total contact cast but unfortunately he had some falling issues that the family contacted [place] about we took the cast off we did not put it back on. Nonetheless it ended up that just a couple days later he was in the hospital due to elevations in his blood sugar which went to around 500 and even a little higher. Subsequently he was admitted to the hospital and was in the hospital for a total of 3 days and that included Christmas day. Fortunately there got  his blood sugar under control he tells me that this is running about 200 now which is much better news. There does not appear to be any signs of active infection at this time. No fevers, chills, nausea, vomiting, or diarrhea. 04/30/2019 on evaluation today patient actually appears to be doing slightly worse compared to previous as far as the overall size of his wound is concerned. There also is some need for sharp debridement today as well. Fortunately there is no evidence of active infection which is good news. No fevers, chills, nausea, vomiting, or diarrhea. 05/07/2019 upon evaluation today patient appears to be doing excellent in regard to his plantar foot ulcer. He has been tolerating the dressing changes without complication. Fortunately there is no signs of active infection at this time. No fevers, chills, nausea, vomiting, or diarrhea. He does tell me that he actually got in touch with [employer] and they feel like they can add diagnosis codes to his orders in order to get an external brace for him as opposed to the one that goes internal to issue. That would be excellent for his foot drop and I did tell him that if there is anything I need to fill out in that regard I will be more than happy to oblige and fill out  what is needed for [employer]. 05/14/2019 upon evaluation today patient's original wound site actually appears to be doing much better which is good news. Fortunately there is no signs of active infection at this time. No fevers, chills, nausea, vomiting, or diarrhea. With that being said the patient unfortunately is continuing to have issues with rubbing he tells me that the felt slid on his foot although he did not realize it until today. Nonetheless I think this did cause a blister around the edge of the wound and he does an open wound that is slightly larger overall in measurement compared to last time mainly due to this blister. Other than that I feel like he is doing quite well. 05/21/2019 upon evaluation today patient appears to be doing excellent in regard to his wound at this time on the plantar foot. He is showing signs of improvement he does have some slough and biofilm noted on the surface of the wound this is going require some sharp debridement today as well. Fortunately there is no evidence of active infection. No fevers, chills, nausea, vomiting, or diarrhea. 05/28/2019 on evaluation today patient's wound actually appears to be measuring smaller upon initial inspection and I do believe that it is doing well. With that being said he continues to have a blister on the outside/lateral edge of the wound that occurs as a result of rubbing due to his dropfoot currently. Subsequently we have filled out the paperwork for his new external brace but again that does take several weeks to get that completed once everything is returned to back we did send this back last week as far as the paperwork was concerned. With that being said there does not appear to be any signs of active infection at this time. Electronic Signature(s) Signed: 05/28/2019 1:09:44 PM By: Worthy Keeler [name] Entered By: Worthy Keeler on 05/28/2019 13:09:44 [name]  ([id number]) -------------------------------------------------------------------------------- Physical Exam Details Patient Name: [name] Date of Service: 05/28/2019 10:45 AM Medical Record Number: [id number] Patient Account Number: [id number] Date of Birth/Sex: 06-Feb-1946 (73 y.o. M) Treating RN: Cornell Barman Primary Care Provider: [name] Other Clinician: Referring Provider: [name] Treating Provider/Extender: [name], Shan Valdes Weeks in  Treatment: 83 Constitutional Well-nourished and well-hydrated in no acute distress. Respiratory normal breathing without difficulty. Psychiatric this patient is able to make decisions and demonstrates good insight into disease process. Alert and Oriented x 3. pleasant and cooperative. Notes Upon inspection patient's wound bed actually showed signs again of good granulation there was minimal slough noted did require some sharp debridement to remove the slough from the surface of the wound. The patient tolerated that today without complication and post debridement the wound bed appears to be doing much better which is great news. I did remove some of the blister tissue as well from the lateral portion there is a very small area of opening here but again this was mainly new skin underneath the blister. Electronic Signature(s) Signed: 05/28/2019 1:10:12 PM By: Worthy Keeler [name] Entered By: Worthy Keeler on 05/28/2019 13:10:12 [name] ([id number]) -------------------------------------------------------------------------------- Physician Orders Details Patient Name: [name] Date of Service: 05/28/2019 10:45 AM Medical Record Number: [id number] Patient Account Number: [id number] Date of Birth/Sex: 06/17/1945 (73 y.o. M) Treating RN: Cornell Barman Primary Care Provider: [name] Other Clinician: Referring Provider: [name] Treating Provider/Extender: Melburn Hake, Jodie Cavey Weeks in Treatment: 15 Verbal /  Phone Orders: No Diagnosis Coding ICD-10 Coding Code Description E11.621 Type 2 diabetes mellitus with foot ulcer E11.42 Type 2 diabetes mellitus with diabetic polyneuropathy L97.522 Non-pressure chronic ulcer of other part of left foot with fat layer exposed M21.372 Foot drop, left foot Wound Cleansing Wound #5 Left,Lateral Foot o May shower with protection. Anesthetic (add to Medication List) Wound #5 Left,Lateral Foot o Topical Lidocaine 4% cream applied to wound bed prior to debridement (In Clinic Only). Primary Wound Dressing Wound #5 Left,Lateral Foot o Silver Collagen Secondary Dressing Wound #5 Left,Lateral Foot o Conform/Kerlix - Rolled gauze o Other - Felt to offload Follow-up Appointments Wound #5 Left,Lateral Foot o Return Appointment in 1 week. Off-Loading o Turn and reposition every 2 hours - Elevate foot when sitting Additional Orders / Instructions Wound #5 Left,Lateral Foot o Other: - Patient to follow-up with [employer] for adjustments to braces and shoes. Requested paperwork sent to [facility]. Electronic Signature(s) Signed: 05/28/2019 4:43:11 PM By: Worthy Keeler [name] Signed: 05/28/2019 5:08:08 PM By: Gretta Cool, BSN, RN, CWS, Kim RN, BSN Entered By: Gretta Cool, BSN, RN, CWS, Kim on 05/28/2019 11:37:13 MALEKI, HIPPE ([id number]) [name], [name] ([id number]) -------------------------------------------------------------------------------- Problem List Details Patient Name: [name] Date of Service: 05/28/2019 10:45 AM Medical Record Number: [id number] Patient Account Number: [id number] Date of Birth/Sex: [birth year] (73 y.o. M) Treating RN: Cornell Barman Primary Care Provider: [name] Other Clinician: Referring Provider: [name] Treating Provider/Extender: Melburn Hake, Dewanna Hurston Weeks in Treatment: 15 Active Problems ICD-10 Evaluated Encounter Code Description Active Date Today Diagnosis E11.621 Type 2 diabetes mellitus with  foot ulcer 02/09/2019 No Yes E11.42 Type 2 diabetes mellitus with diabetic polyneuropathy 02/09/2019 No Yes L97.522 Non-pressure chronic ulcer of other part of left foot with fat 02/09/2019 No Yes layer exposed M21.372 Foot drop, left foot 02/09/2019 No Yes Inactive Problems Resolved Problems Electronic Signature(s) Signed: 05/28/2019 11:04:39 AM By: Worthy Keeler [name] Entered By: Worthy Keeler on 05/28/2019 11:04:39 [name] ([id number]) -------------------------------------------------------------------------------- Progress Note Details Patient Name: [name] Date of Service: 05/28/2019 10:45 AM Medical Record Number: [id number] Patient Account Number: [id number] Date of Birth/Sex: [birth year] (73 y.o. M) Treating RN: Cornell Barman Primary Care Provider: [name] Other Clinician: Referring Provider: [name] Treating Provider/Extender: Melburn Hake, Leiyah Maultsby Weeks in Treatment:  15 Subjective Chief Complaint Information obtained from Patient Left dorsal foot ulcer 02/09/2019; patient is here for review of the wound on the left lateral foot roughly at the level of the base of the fifth metatarsal History of Present Illness (HPI) 10/04/16 on evaluation today patient presents for initial evaluation concerning a laceration of the right wrist and hand on the bowler aspect. He tells me that he fell into glass following a medication change in regard to his diabetes. He initially went to [facility] where she was sutured and subsequently his daughter who is a Marine scientist removed the teachers at the appropriate time. Unfortunately the wound has done poorly and dehissed which has caused him some trouble including infection. He has been on antibiotics both topically and orally though the wound continues to show signs of necrotic tissue according to notes which is what he was sent to [place] for evaluation concerning. He does have diabetes and unfortunately this is severely  uncontrolled. He does see an endocrinologist and is working on this. 10/11/2016 -- the patient's notes from the ER were reviewed and he had his suturing done on 09/17/2016 and had necrotic skin flaps which had to be trimmed the last visit he was here. He has managed to get his Santyl ointment and has been using this appropriately. 10/21/16 patient's wound appears to be doing very well on evaluation today. He still has some Slough covering the wound bed but this is nowhere near as severe as when i saw this initially two weeks ago nor even my review of his pictures last week. I'm pleased with how this is progressing. Patient's wound over the right form appears to be doing fairly well on evaluation today. There is no evidence of infection and he has a small area at the crease of where she is wrist extends and flexes that is still open and appears to be having difficulty due to the fact that it is cracking open. I think this is something that may be controlled with moisture control that is keeping the wound a little bit more moist. Nonetheless this is a tiny region compared to the initial wound and overall appears to be doing very well. No fevers, chills, nausea, or vomiting noted at this time. 11/25/16 on evaluation today patient appears to be doing well in regard to his right wrist ulcer. In fact this appears to be completely healed he is having no discomfort. He is pleased with how this is progressed. Readmission: 06/24/17 on evaluation today patient appears back in our clinic regarding issues that he has been having with his right ankle on the lateral malleolus as well is in the dorsal surface of his left foot. Both have been going on for several weeks unfortunately. He tells me that initially this was being managed by Dr. Edilia Bo and I did review the notes from Dr. Edilia Bo in epic. Patient was placed on amoxicillin initially and then subsequently Keflex following. He has been using triple  antibiotic ointment over-the-counter along with a Band-Aid for the wound currently. He did not have any Santyl remaining. His most recent hemoglobin A1c which was just two days ago was 10.3. Fortunately he seems to have excellent blood flow in the bilateral lower extremities with his left ABI being 0.95 in the right ABI 1.01. Patient does have some pain in regard to each of these ulcerations but he tells me it's not nearly as severe as the hand wound that I help treat earlier over the summer. This was  in 2018. 07/15/17 on evaluation today patient's lower surety ulcer is actually both appear to be doing fairly well at this point. He does have a little bit of skin overlapping the ulcer on the right lateral ankle this was debrided away today just with saline and gauze without complication patient had no discomfort or pain. Otherwise patient's left foot ulcer does appear to be showing signs of [name], [name]. ([id number]) improvement he does seem to have a right great toe. Nikki and noted at this point in time. I think that this does need to be managed at this point. No fevers, chills, nausea, or vomiting noted at this time. 07/22/17 on evaluation today patient's wounds in regard to his lower extremities appear to be doing somewhat better. His right ankle wound in fact appears to likely be completely closed. The left dorsal foot ulcer though still open does appear to be a little bit smaller and does also seem to be making good progress. He has not been having problems with the treatment at this point in the general he does not seem to show as much erythema surrounding the wound bed which is good news. He is not having significant discomfort. 08/12/17 on evaluation today patients wounds of both appeared to be doing better in fact they both appear to be completely healed. Overall I'm very pleased with how things have progressed over the past week even more than what I expected. Obviously this is good news  and he is happy about this. He did have a fall when going to his orthopedic doctor recently fortunately he does not appear to have injured anything severely but nonetheless does have a couple of bumps and bruises nothing significant at this time but he needs to be seen and wound care for. He may have broken his left wrist he is following up with orthopedics in that regard. Readmission: 10/24/17 on evaluation today patient presents for reevaluation after having been discharged Aug 12, 2017. His wound was healed at that point although he states that it has been somewhat discolored since that time. He has not experienced the drainage as far as the wound is concerned but the fact that it was still discolored been greater than two months after the fact made him want to come in for reevaluation. Upon inspection today the patient has no pain although he does have neuropathy. He does have a discolored region of the dorsal surface of the left foot which was the site where the ulcer was that we previously treated. The good news is he does not again have any discomfort although obviously I do believe this may be some scar tissue and just discoloration in that regard. Again he hasn't had any drainage or discharge since I last saw him and at this point I see no evidence of that either this area shows complete epithelialization. In general I do not feel like there's any significant issue at this point in that regard. 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 He has been in this clinic before in 2018 with a laceration on his right hand and then again in 2019 with an area over the right lateral malleolus. 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 He states that the wound is been there for the last week or 2. It is been uncomfortable to walk on. The  man is an active man having to look after an elderly mother. Past medical history, type 2 diabetes with polyneuropathy with a recent hemoglobin A1c in September 2011 0.3, lumbar disc disease, hypergonadism., Left foot drop and gout. We did not repeat his ABIs in the clinic quoting values from July 2019 of 0.95 and 1.01. He is not felt to have significant PAD 02/19/2019 on evaluation today patient actually appears to be doing better with regard to his foot wound based on what I am seeing currently. He was seen by Dr. Dellia Nims on readmission back into the clinic last week when I was off. The good news is the patient has been tolerating the dressing changes without any complication in fact has not really had much in the way of drainage. He does have an AFO brace which she believes is actually pushing on this area as well and has contributed to the issue. Fortunately there is no signs of active infection at this point. His ABIs appear to be well. 03/05/2019 on evaluation today patient appears to be doing well in regard to his foot ulcer. He has been tolerating the dressing changes without complication. Fortunately there is no signs of active infection. The wound is measuring somewhat smaller and overall I feel like he is making good progress at this point. 03/12/2019 on evaluation today patient actually appears to be doing quite well with regard to his foot ulcer. The wound is looking better the measurement is not significantly better at this point unfortunately but nonetheless again the overall appearance is improving I think he is making some progress. I still think the big issue here is foot drop and again the patient wants to see if potentially he could get a brace to help with this. Subsequently I think that we can definitely make a referral to [facility] to see if they can help [place] in this regard. He is in agreement with that plan. 03/26/2019 on evaluation today patient appears to be doing more  poorly in regard to his foot ulcer at this time. He did see podiatry in order to see about getting a brace but unfortunately he tells me that Medicare is not likely to pay for one for him as [name], [name]. ([id number]) he had 1 I guess 8 months ago. However this one that apparently goes in his shoes that does not seem to be working out well for him. Unfortunately there are signs of active infection at this time. No fevers, chills, nausea, vomiting, or diarrhea. Systemically. The patient has erythema on the dorsal surface of his foot that tracks from the lateral portion of his foot and again he does have a blister around the wound as well which is worse compared to previous. Overall I am concerned. 04/02/2019 on evaluation today patient appears to be doing about the same with regard to his wound. Unfortunately there is no signs of significant improvement although I do think there is no signs of infection which is good news. His drainage is also slowed down tremendously. I think at this point he would benefit from the initiation of a total contact cast. The only concern I have is that over his balance. I explained to the patient that if we were to initiate the cast I think it would be greatly beneficial for him but he would need to ensure not to walk at all unless he uses a walker he actually has 2 walkers one for inside his home and one that he keeps out in  the car. For that reason he would be covered in that regard. I just do not want him to have any accidents and fall with the cast. 12/28-Patient comes in 1 week out a day early for his appointment he was in the hospital for 2 days for uncontrolled hyperglycemia and required management of high blood sugars, with some adjustments in his home regimen of insulin, patient stubbed his right great toe today and put a Band-Aid on it, he is here for his left lateral foot ulcer and we are using silver alginate dressing, he was intact total contact cast  with difficulty with balance to the extent that that had to be discontinued 04/16/2019 on evaluation today patient appears to be doing better with regard to the overall appearance of his wound size. This is good news. We had attempted a total contact cast but unfortunately he had some falling issues that the family contacted [place] about we took the cast off we did not put it back on. Nonetheless it ended up that just a couple days later he was in the hospital due to elevations in his blood sugar which went to around 500 and even a little higher. Subsequently he was admitted to the hospital and was in the hospital for a total of 3 days and that included Christmas day. Fortunately there got his blood sugar under control he tells me that this is running about 200 now which is much better news. There does not appear to be any signs of active infection at this time. No fevers, chills, nausea, vomiting, or diarrhea. 04/30/2019 on evaluation today patient actually appears to be doing slightly worse compared to previous as far as the overall size of his wound is concerned. There also is some need for sharp debridement today as well. Fortunately there is no evidence of active infection which is good news. No fevers, chills, nausea, vomiting, or diarrhea. 05/07/2019 upon evaluation today patient appears to be doing excellent in regard to his plantar foot ulcer. He has been tolerating the dressing changes without complication. Fortunately there is no signs of active infection at this time. No fevers, chills, nausea, vomiting, or diarrhea. He does tell me that he actually got in touch with [employer] and they feel like they can add diagnosis codes to his orders in order to get an external brace for him as opposed to the one that goes internal to issue. That would be excellent for his foot drop and I did tell him that if there is anything I need to fill out in that regard I will be more than happy to oblige and fill out  what is needed for [employer]. 05/14/2019 upon evaluation today patient's original wound site actually appears to be doing much better which is good news. Fortunately there is no signs of active infection at this time. No fevers, chills, nausea, vomiting, or diarrhea. With that being said the patient unfortunately is continuing to have issues with rubbing he tells me that the felt slid on his foot although he did not realize it until today. Nonetheless I think this did cause a blister around the edge of the wound and he does an open wound that is slightly larger overall in measurement compared to last time mainly due to this blister. Other than that I feel like he is doing quite well. 05/21/2019 upon evaluation today patient appears to be doing excellent in regard to his wound at this time on the plantar foot. He is showing signs of improvement  he does have some slough and biofilm noted on the surface of the wound this is going require some sharp debridement today as well. Fortunately there is no evidence of active infection. No fevers, chills, nausea, vomiting, or diarrhea. 05/28/2019 on evaluation today patient's wound actually appears to be measuring smaller upon initial inspection and I do believe that it is doing well. With that being said he continues to have a blister on the outside/lateral edge of the wound that occurs as a result of rubbing due to his dropfoot currently. Subsequently we have filled out the paperwork for his new external brace but again that does take several weeks to get that completed once everything is returned to back we did send this back last week as far as the paperwork was concerned. With that being said there does not appear to be any signs of active infection at this time. [name], [name] ([id number]) Objective Constitutional Well-nourished and well-hydrated in no acute distress. Vitals Time Taken: 11:06 AM, Height: 71 in, Weight: 205 lbs, BMI: 28.6, Temperature: 98.0  F, Pulse: 105 bpm, Respiratory Rate: 16 breaths/min, Blood Pressure: 160/85 mmHg. Respiratory normal breathing without difficulty. Psychiatric this patient is able to make decisions and demonstrates good insight into disease process. Alert and Oriented x 3. pleasant and cooperative. General Notes: Upon inspection patient's wound bed actually showed signs again of good granulation there was minimal slough noted did require some sharp debridement to remove the slough from the surface of the wound. The patient tolerated that today without complication and post debridement the wound bed appears to be doing much better which is great news. I did remove some of the blister tissue as well from the lateral portion there is a very small area of opening here but again this was mainly new skin underneath the blister. Integumentary (Hair, Skin) Wound #5 status is Open. Original cause of wound was Pressure Injury. The wound is located on the Left,Lateral Foot. The wound measures 0.5cm length x 0.6cm width x 0.1cm depth; 0.236cm^2 area and 0.024cm^3 volume. There is Fat Layer (Subcutaneous Tissue) Exposed exposed. There is no tunneling or undermining noted. There is a medium amount of serosanguineous drainage noted. The wound margin is flat and intact. There is medium (34-66%) pink granulation within the wound bed. There is a medium (34-66%) amount of necrotic tissue within the wound bed including Adherent Slough. 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 There was a Excisional Skin/Subcutaneous Tissue Debridement with a  total area of 0.54 sq cm performed by [name], Danna Casella E., [name]. With the following instrument(s): Curette to remove Viable and Non-Viable tissue/material. Material removed includes Subcutaneous Tissue, Slough, and SkinMYCHAL, Dustin E. ([id number]) Dermis after achieving pain control using Lidocaine. No specimens were taken. A time out was conducted at 11:30, prior to the start of the procedure. A Minimum amount of bleeding was controlled with Pressure. The procedure was tolerated well. Post Debridement Measurements: 0.9cm length x 0.6cm width x 0.2cm depth; 0.085cm^3 volume. Character of Wound/Ulcer Post Debridement is stable. Severity of Tissue Post Debridement is: Fat layer exposed. Post procedure Diagnosis Wound #5: Same as Pre-Procedure Plan Wound Cleansing: Wound #5  Left,Lateral Foot: May shower with protection. Anesthetic (add to Medication List): Wound #5 Left,Lateral Foot: Topical Lidocaine 4% cream applied to wound bed prior to debridement (In Clinic Only). Primary Wound Dressing: Wound #5 Left,Lateral Foot: Silver Collagen Secondary Dressing: Wound #5 Left,Lateral Foot: Conform/Kerlix - Rolled gauze Other - Felt to offload Follow-up Appointments: Wound #5 Left,Lateral Foot: Return Appointment in 1 week. Off-Loading: Turn and reposition every 2 hours - Elevate foot when sitting Additional Orders / Instructions: Wound #5 Left,Lateral Foot: Other: - Patient to follow-up with [employer] for adjustments to braces and shoes. Requested paperwork sent to [employer]. 1. My suggestion at this time is good to be that regarding continue with the current wound care measures specifically with regard to the silver collagen along with offloading felt in order to try to keep pressure off for the time being that seems to still be the best thing for him. 2. [employer] is working on his brace for foot drop which hopefully should help as well this will be external and not internal to issue that  should help prevent anything from rubbing. 3. I am also going to suggest the patient try to limit his walking is much as possible. We will see patient back for reevaluation in 1 week here in the clinic. If anything worsens or changes patient will contact our office for additional recommendations. Electronic Signature(s) Signed: 05/28/2019 1:11:02 PM By: Worthy Keeler [name] Entered By: Worthy Keeler on 05/28/2019 13:11:02 [name] ([id number]) -------------------------------------------------------------------------------- SuperBill Details Patient Name: [name] Date of Service: 05/28/2019 Medical Record Number: [id number] Patient Account Number: [id number] Date of Birth/Sex: [birth year] (73 y.o. M) Treating RN: Cornell Barman Primary Care Provider: [name] Other Clinician: Referring Provider: [name] Treating Provider/Extender: Melburn Hake, Obi Scrima Weeks in Treatment: 15 Diagnosis Coding ICD-10 Codes Code Description E11.621 Type 2 diabetes mellitus with foot ulcer E11.42 Type 2 diabetes mellitus with diabetic polyneuropathy L97.522 Non-pressure chronic ulcer of other part of left foot with fat layer exposed M21.372 Foot drop, left foot Facility Procedures CPT4 Code: [id number] Description: 33354 - DEB SUBQ TISSUE 20 SQ CM/< ICD-10 Diagnosis Description L97.522 Non-pressure chronic ulcer of other part of left foot with fat Modifier: layer exposed Quantity: 1 Physician Procedures CPT4 Code: [id number] Description: 11042 - WC PHYS SUBQ TISS 20 SQ CM ICD-10 Diagnosis Description L97.522 Non-pressure chronic ulcer of other part of left foot with fat Modifier: layer exposed Quantity: 1 Electronic Signature(s) Signed: 05/28/2019 1:11:20 PM By: Worthy Keeler [name] Entered By: Worthy Keeler on 05/28/2019 13:11:20

## 2019-05-31 ENCOUNTER — Ambulatory Visit: Payer: Medicare Other

## 2019-06-04 ENCOUNTER — Encounter: Payer: Medicare Other | Admitting: Physician Assistant

## 2019-06-04 ENCOUNTER — Other Ambulatory Visit: Payer: Self-pay

## 2019-06-04 DIAGNOSIS — E1142 Type 2 diabetes mellitus with diabetic polyneuropathy: Secondary | ICD-10-CM | POA: Diagnosis not present

## 2019-06-04 DIAGNOSIS — M1990 Unspecified osteoarthritis, unspecified site: Secondary | ICD-10-CM | POA: Diagnosis not present

## 2019-06-04 DIAGNOSIS — M109 Gout, unspecified: Secondary | ICD-10-CM | POA: Diagnosis not present

## 2019-06-04 DIAGNOSIS — Z823 Family history of stroke: Secondary | ICD-10-CM | POA: Diagnosis not present

## 2019-06-04 DIAGNOSIS — Z794 Long term (current) use of insulin: Secondary | ICD-10-CM | POA: Diagnosis not present

## 2019-06-04 DIAGNOSIS — Z809 Family history of malignant neoplasm, unspecified: Secondary | ICD-10-CM | POA: Diagnosis not present

## 2019-06-04 DIAGNOSIS — L97522 Non-pressure chronic ulcer of other part of left foot with fat layer exposed: Secondary | ICD-10-CM | POA: Diagnosis not present

## 2019-06-04 DIAGNOSIS — I10 Essential (primary) hypertension: Secondary | ICD-10-CM | POA: Diagnosis not present

## 2019-06-04 DIAGNOSIS — Z833 Family history of diabetes mellitus: Secondary | ICD-10-CM | POA: Diagnosis not present

## 2019-06-04 DIAGNOSIS — I252 Old myocardial infarction: Secondary | ICD-10-CM | POA: Diagnosis not present

## 2019-06-04 DIAGNOSIS — Z8249 Family history of ischemic heart disease and other diseases of the circulatory system: Secondary | ICD-10-CM | POA: Diagnosis not present

## 2019-06-04 DIAGNOSIS — E11621 Type 2 diabetes mellitus with foot ulcer: Secondary | ICD-10-CM | POA: Diagnosis not present

## 2019-06-04 DIAGNOSIS — M21372 Foot drop, left foot: Secondary | ICD-10-CM | POA: Diagnosis not present

## 2019-06-04 DIAGNOSIS — E1151 Type 2 diabetes mellitus with diabetic peripheral angiopathy without gangrene: Secondary | ICD-10-CM | POA: Diagnosis not present

## 2019-06-04 DIAGNOSIS — Z87891 Personal history of nicotine dependence: Secondary | ICD-10-CM | POA: Diagnosis not present

## 2019-06-04 NOTE — Progress Notes (Addendum)
MAKENA, MCGRADY ([id number]) Visit Report for 06/04/2019 Chief Complaint Document Details Patient Name: [name], [name] Date of Service: 06/04/2019 10:45 AM Medical Record Number: [id number] Patient Account Number: [id number] Date of Birth/Sex: [birth year] (75 y.o. M) Treating RN: Cornell Barman Primary Care Provider: [name] Other Clinician: Referring Provider: [name] Treating Provider/Extender: Melburn Hake, Breckon Reeves Weeks in Treatment: 16 Information Obtained from: Patient Chief Complaint Left dorsal foot ulcer 02/09/2019; patient is here for review of the wound on the left lateral foot roughly at the level of the base of the fifth metatarsal Electronic Signature(s) Signed: 06/04/2019 11:04:33 AM By: Worthy Keeler [name] Entered By: Worthy Keeler on 06/04/2019 11:04:33 [name] ([id number]) -------------------------------------------------------------------------------- Debridement Details Patient Name: [name] Date of Service: 06/04/2019 10:45 AM Medical Record Number: [id number] Patient Account Number: [id number] Date of Birth/Sex: 1945/08/14 (73 y.o. M) Treating RN: Cornell Barman Primary Care Provider: [name] Other Clinician: Referring Provider: [name] Treating Provider/Extender: Melburn Hake, Aleeya Veitch Weeks in Treatment: 16 Debridement Performed for Wound #5 Left,Lateral Foot Assessment: Performed By: Physician [name], Kaden Daughdrill E., [name] Debridement Type: Debridement Severity of Tissue Pre Debridement: Fat layer exposed Level of Consciousness (Pre- Awake and Alert procedure): Pre-procedure Verification/Time Out Yes - 11:05 Taken: Start Time: 11:05 Pain Control: Lidocaine Total Area Debrided (L x W): 0.8 (cm) x 1.2 (cm) = 0.96 (cm) Tissue and other material debrided: Viable, Non-Viable, Callus, Slough, Subcutaneous, Slough Level: Skin/Subcutaneous Tissue Debridement Description: Excisional Instrument: Curette Bleeding:  Minimum Hemostasis Achieved: Pressure End Time: 11:11 Response to Treatment: Procedure was tolerated well Level of Consciousness (Post- Awake and Alert procedure): Post Debridement Measurements of Total Wound Length: (cm) 0.8 Width: (cm) 1.2 Depth: (cm) 0.2 Volume: (cm) 0.151 Character of Wound/Ulcer Post Debridement: Stable Severity of Tissue Post Debridement: Fat layer exposed Post Procedure Diagnosis Same as Pre-procedure Electronic Signature(s) Signed: 06/04/2019 5:29:33 PM By: Worthy Keeler [name] Signed: 06/15/2019 5:40:44 PM By: Gretta Cool, BSN, RN, CWS, Kim RN, BSN Entered By: Gretta Cool, BSN, RN, CWS, Kim on 06/04/2019 11:10:02 [name] ([id number]) -------------------------------------------------------------------------------- HPI Details Patient Name: [name] Date of Service: 06/04/2019 10:45 AM Medical Record Number: [id number] Patient Account Number: [id number] Date of Birth/Sex: 09-29-1945 (73 y.o. M) Treating RN: Cornell Barman Primary Care Provider: [name] Other Clinician: Referring Provider: [name] Treating Provider/Extender: Melburn Hake, Timithy Arons Weeks in Treatment: 16 History of Present Illness HPI Description: 10/04/16 on evaluation today patient presents for initial evaluation concerning a laceration of the right wrist and hand on the bowler aspect. He tells me that he fell into glass following a medication change in regard to his diabetes. He initially went to [facility] where she was sutured and subsequently his daughter who is a Marine scientist removed the teachers at the appropriate time. Unfortunately the wound has done poorly and dehissed which has caused him some trouble including infection. He has been on antibiotics both topically and orally though the wound continues to show signs of necrotic tissue according to notes which is what he was sent to [place] for evaluation concerning. He does have diabetes and unfortunately this is severely  uncontrolled. He does see an endocrinologist and is working on this. 10/11/2016 -- the patient's notes from the ER were reviewed and he had his suturing done on 09/17/2016 and had necrotic skin flaps which had to be trimmed the last visit he was here. He has managed to get his Santyl ointment and has been using this appropriately. 10/21/16 patient's wound appears to  be doing very well on evaluation today. He still has some Slough covering the wound bed but this is nowhere near as severe as when i saw this initially two weeks ago nor even my review of his pictures last week. I'm pleased with how this is progressing. Patient's wound over the right form appears to be doing fairly well on evaluation today. There is no evidence of infection and he has a small area at the crease of where she is wrist extends and flexes that is still open and appears to be having difficulty due to the fact that it is cracking open. I think this is something that may be controlled with moisture control that is keeping the wound a little bit more moist. Nonetheless this is a tiny region compared to the initial wound and overall appears to be doing very well. No fevers, chills, nausea, or vomiting noted at this time. 11/25/16 on evaluation today patient appears to be doing well in regard to his right wrist ulcer. In fact this appears to be completely healed he is having no discomfort. He is pleased with how this is progressed. Readmission: 06/24/17 on evaluation today patient appears back in our clinic regarding issues that he has been having with his right ankle on the lateral malleolus as well is in the dorsal surface of his left foot. Both have been going on for several weeks unfortunately. He tells me that initially this was being managed by Dr. Edilia Bo and I did review the notes from Dr. Edilia Bo in epic. Patient was placed on amoxicillin initially and then subsequently Keflex following. He has been using triple antibiotic  ointment over-the-counter along with a Band-Aid for the wound currently. He did not have any Santyl remaining. His most recent hemoglobin A1c which was just two days ago was 10.3. Fortunately he seems to have excellent blood flow in the bilateral lower extremities with his left ABI being 0.95 in the right ABI 1.01. Patient does have some pain in regard to each of these ulcerations but he tells me it's not nearly as severe as the hand wound that I help treat earlier over the summer. This was in 2018. 07/15/17 on evaluation today patient's lower surety ulcer is actually both appear to be doing fairly well at this point. He does have a little bit of skin overlapping the ulcer on the right lateral ankle this was debrided away today just with saline and gauze without complication patient had no discomfort or pain. Otherwise patient's left foot ulcer does appear to be showing signs of improvement he does seem to have a right great toe. Nikki and noted at this point in time. I think that this does need to be managed at this point. No fevers, chills, nausea, or vomiting noted at this time. 07/22/17 on evaluation today patient's wounds in regard to his lower extremities appear to be doing somewhat better. His right ankle wound in fact appears to likely be completely closed. The left dorsal foot ulcer though still open does appear to be a little bit smaller and does also seem to be making good progress. He has not been having problems with the treatment at this point in the general he does not seem to show as much erythema surrounding the wound bed which is good news. He is not having significant discomfort. 08/12/17 on evaluation today patients wounds of both appeared to be doing better in fact they both appear to be completely healed. Overall I'm very pleased with how things  have progressed over the past week even more than what I expected. Obviously this is good news and he is happy about this. He did have a fall  when going to his orthopedic doctor recently fortunately he does not appear to have injured anything severely but nonetheless does have a couple of bumps and bruises nothing significant at this time but he needs to be seen and wound care for. He may have broken his left wrist he is following up with orthopedics in that regard. Readmission: 10/24/17 on evaluation today patient presents for reevaluation after having been discharged Aug 12, 2017. His wound was healed at that point although he states that it has been somewhat discolored since that time. He has not experienced the drainage as far as the wound is concerned but the fact that it was still discolored been greater than two months after the fact made him want to come in for reevaluation. Upon inspection today the patient has no pain although he does have neuropathy. He does have a discolored region of the dorsal surface of the left foot which was the site where the ulcer was that we previously treated. The good news is he does not again have any discomfort although obviously I do believe this may be some scar tissue and just discoloration in that regard. Again he hasn't had any drainage or discharge since I last saw him and at this point I see no evidence of that either this area shows complete epithelialization. In general I do not feel like there's any significant issue at this point in that regard. [name], [name] ([id number]) 02/09/2019 This is a 74 year old man with type 2 diabetes poorly controlled with significant peripheral neuropathy. He has been in this clinic before in 2018 with a laceration on his right hand and then again in 2019 with an area over the right lateral malleolus. 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 He states that the wound is been there for the last week or 2. It is been uncomfortable to walk on. The man is an active  man having to look after an elderly mother. Past medical history, type 2 diabetes with polyneuropathy with a recent hemoglobin A1c in September 2011 0.3, lumbar disc disease, hypergonadism., Left foot drop and gout. We did not repeat his ABIs in the clinic quoting values from July 2019 of 0.95 and 1.01. He is not felt to have significant PAD 02/19/2019 on evaluation today patient actually appears to be doing better with regard to his foot wound based on what I am seeing currently. He was seen by Dr. Dellia Nims on readmission back into the clinic last week when I was off. The good news is the patient has been tolerating the dressing changes without any complication in fact has not really had much in the way of drainage. He does have an AFO brace which she believes is actually pushing on this area as well and has contributed to the issue. Fortunately there is no signs of active infection at this point. His ABIs appear to be well. 03/05/2019 on evaluation today patient appears to be doing well in regard to his foot ulcer. He has been tolerating the dressing changes without complication. Fortunately there is no signs of active infection. The wound is measuring somewhat smaller and overall I feel like he is making good progress at this point. 03/12/2019 on evaluation today patient  actually appears to be doing quite well with regard to his foot ulcer. The wound is looking better the measurement is not significantly better at this point unfortunately but nonetheless again the overall appearance is improving I think he is making some progress. I still think the big issue here is foot drop and again the patient wants to see if potentially he could get a brace to help with this. Subsequently I think that we can definitely make a referral to [facility] to see if they can help [place] in this regard. He is in agreement with that plan. 03/26/2019 on evaluation today patient appears to be doing more poorly in regard to his  foot ulcer at this time. He did see podiatry in order to see about getting a brace but unfortunately he tells me that Medicare is not likely to pay for one for him as he had 1 I guess 8 months ago. However this one that apparently goes in his shoes that does not seem to be working out well for him. Unfortunately there are signs of active infection at this time. No fevers, chills, nausea, vomiting, or diarrhea. Systemically. The patient has erythema on the dorsal surface of his foot that tracks from the lateral portion of his foot and again he does have a blister around the wound as well which is worse compared to previous. Overall I am concerned. 04/02/2019 on evaluation today patient appears to be doing about the same with regard to his wound. Unfortunately there is no signs of significant improvement although I do think there is no signs of infection which is good news. His drainage is also slowed down tremendously. I think at this point he would benefit from the initiation of a total contact cast. The only concern I have is that over his balance. I explained to the patient that if we were to initiate the cast I think it would be greatly beneficial for him but he would need to ensure not to walk at all unless he uses a walker he actually has 2 walkers one for inside his home and one that he keeps out in the car. For that reason he would be covered in that regard. I just do not want him to have any accidents and fall with the cast. 12/28-Patient comes in 1 week out a day early for his appointment he was in the hospital for 2 days for uncontrolled hyperglycemia and required management of high blood sugars, with some adjustments in his home regimen of insulin, patient stubbed his right great toe today and put a Band-Aid on it, he is here for his left lateral foot ulcer and we are using silver alginate dressing, he was intact total contact cast with difficulty with balance to the extent that that had to  be discontinued 04/16/2019 on evaluation today patient appears to be doing better with regard to the overall appearance of his wound size. This is good news. We had attempted a total contact cast but unfortunately he had some falling issues that the family contacted [place] about we took the cast off we did not put it back on. Nonetheless it ended up that just a couple days later he was in the hospital due to elevations in his blood sugar which went to around 500 and even a little higher. Subsequently he was admitted to the hospital and was in the hospital for a total of 3 days and that included Christmas day. Fortunately there got his blood sugar under control  he tells me that this is running about 200 now which is much better news. There does not appear to be any signs of active infection at this time. No fevers, chills, nausea, vomiting, or diarrhea. 04/30/2019 on evaluation today patient actually appears to be doing slightly worse compared to previous as far as the overall size of his wound is concerned. There also is some need for sharp debridement today as well. Fortunately there is no evidence of active infection which is good news. No fevers, chills, nausea, vomiting, or diarrhea. 05/07/2019 upon evaluation today patient appears to be doing excellent in regard to his plantar foot ulcer. He has been tolerating the dressing changes without complication. Fortunately there is no signs of active infection at this time. No fevers, chills, nausea, vomiting, or diarrhea. He does tell me that he actually got in touch with [employer] and they feel like they can add diagnosis codes to his orders in order to get an external brace for him as opposed to the one that goes internal to issue. That would be excellent for his foot drop and I did tell him that if there is anything I need to fill out in that regard I will be more than happy to oblige and fill out what is needed for [employer]. 05/14/2019 upon evaluation today  patient's original wound site actually appears to be doing much better which is good news. Fortunately there is no signs of active infection at this time. No fevers, chills, nausea, vomiting, or diarrhea. With that being said the patient unfortunately is continuing to have issues with rubbing he tells me that the felt slid on his foot although he did not realize it until today. Nonetheless I think this did cause a blister around the edge of the wound and he does an open wound that is slightly larger overall in measurement compared to last time mainly due to this blister. Other than that I feel like he is doing quite well. 05/21/2019 upon evaluation today patient appears to be doing excellent in regard to his wound at this time on the plantar foot. He is showing signs of improvement he does have some slough and biofilm noted on the surface of the wound this is going require some sharp debridement today as well. Fortunately there is no evidence of active infection. No fevers, chills, nausea, vomiting, or diarrhea. 05/28/2019 on evaluation today patient's wound actually appears to be measuring smaller upon initial inspection and I do believe that it is doing well. [name], [name] ([id number]) With that being said he continues to have a blister on the outside/lateral edge of the wound that occurs as a result of rubbing due to his dropfoot currently. Subsequently we have filled out the paperwork for his new external brace but again that does take several weeks to get that completed once everything is returned to back we did send this back last week as far as the paperwork was concerned. With that being said there does not appear to be any signs of active infection at this time. 06/04/2019 on evaluation today patient's wound actually appears to be doing slightly better compared to last week which is good news. He still states that his insurance unfortunately does not seem to want to cover any of his new brace.  He is going to just pay for this himself. Nonetheless he is going by there today when he leaves here that is [employer] in [place] that is going to be making this brace for him. Electronic  Signature(s) Signed: 06/04/2019 2:21:41 PM By: Worthy Keeler [name] Entered By: Worthy Keeler on 06/04/2019 14:21:40 [name] ([id number]) -------------------------------------------------------------------------------- Physical Exam Details Patient Name: [name] Date of Service: 06/04/2019 10:45 AM Medical Record Number: [id number] Patient Account Number: [id number] Date of Birth/Sex: [birth year] (73 y.o. M) Treating RN: Cornell Barman Primary Care Provider: [name] Other Clinician: Referring Provider: [name] Treating Provider/Extender: Melburn Hake, Dorman Calderwood Weeks in Treatment: 16 Notes His wound bed currently showed signs of good granulation at this time but does not appear to be any signs of active infection which is good news. No fevers, chills, nausea, vomiting, or diarrhea. No sharp debridement was necessary outside of the wound margin today although did have some slough and biofilm removed from the surface of the wound as well as some callus around the edge in order to prevent this from blistering up yet again. He tolerated all this with no pain. Electronic Signature(s) Signed: 06/04/2019 2:22:22 PM By: Worthy Keeler [name] Entered By: Worthy Keeler on 06/04/2019 14:22:21 [name] ([id number]) -------------------------------------------------------------------------------- Physician Orders Details Patient Name: [name] Date of Service: 06/04/2019 10:45 AM Medical Record Number: [id number] Patient Account Number: [id number] Date of Birth/Sex: 1945/07/21 (73 y.o. M) Treating RN: Cornell Barman Primary Care Provider: [name] Other Clinician: Referring Provider: [name] Treating Provider/Extender: Melburn Hake, Romulus Hanrahan Weeks in Treatment:  16 Verbal / Phone Orders: No Diagnosis Coding ICD-10 Coding Code Description E11.621 Type 2 diabetes mellitus with foot ulcer E11.42 Type 2 diabetes mellitus with diabetic polyneuropathy L97.522 Non-pressure chronic ulcer of other part of left foot with fat layer exposed M21.372 Foot drop, left foot Wound Cleansing Wound #5 Left,Lateral Foot o May shower with protection. Anesthetic (add to Medication List) Wound #5 Left,Lateral Foot o Topical Lidocaine 4% cream applied to wound bed prior to debridement (In Clinic Only). Primary Wound Dressing Wound #5 Left,Lateral Foot o Silver Collagen Secondary Dressing Wound #5 Left,Lateral Foot o Conform/Kerlix - Rolled gauze o Other - Felt to offload Dressing Change Frequency Wound #5 Left,Lateral Foot o Change dressing every other day. Follow-up Appointments Wound #5 Left,Lateral Foot o Return Appointment in 1 week. Edema Control Wound #5 Left,Lateral Foot o Elevate legs to the level of the heart and pump ankles as often as possible Additional Orders / Instructions Wound #5 Left,Lateral Foot o Other: - Patient to follow-up with [employer] for adjustments to braces and shoes. Requested paperwork sent to [employer]. Electronic Signature(s) Signed: 06/04/2019 5:29:33 PM By: Worthy Keeler [name] Signed: 06/15/2019 5:40:44 PM By: Gretta Cool BSN, RN, CWS, Kim RN, BSN Entered By: Gretta Cool, BSN, RN, CWS, Kim on 06/04/2019 11:11:54 [name] ([id number]) -------------------------------------------------------------------------------- Problem List Details Patient Name: [name] Date of Service: 06/04/2019 10:45 AM Medical Record Number: [id number] Patient Account Number: [id number] Date of Birth/Sex: Jan 29, 1946 (73 y.o. M) Treating RN: Cornell Barman Primary Care Provider: [name] Other Clinician: Referring Provider: [name] Treating Provider/Extender: Melburn Hake, Clayvon Parlett Weeks in Treatment: 16 Active  Problems ICD-10 Evaluated Encounter Code Description Active Date Today Diagnosis E11.621 Type 2 diabetes mellitus with foot ulcer 02/09/2019 No Yes E11.42 Type 2 diabetes mellitus with diabetic polyneuropathy 02/09/2019 No Yes L97.522 Non-pressure chronic ulcer of other part of left foot with fat layer exposed 02/09/2019 No Yes M21.372 Foot drop, left foot 02/09/2019 No Yes Inactive Problems Resolved Problems Electronic Signature(s) Signed: 06/04/2019 11:04:27 AM By: Worthy Keeler [name] Entered By: Worthy Keeler on 06/04/2019 11:04:26 [name] ([id number]) -------------------------------------------------------------------------------- Progress  Note Details Patient Name: [name], [name] Date of Service: 06/04/2019 10:45 AM Medical Record Number: [id number] Patient Account Number: [id number] Date of Birth/Sex: [birth year] (74 y.o. M) Treating RN: Cornell Barman Primary Care Provider: [name] Other Clinician: Referring Provider: [name] Treating Provider/Extender: Sharalyn Ink in Treatment: 16 Subjective Chief Complaint Information obtained from Patient Left dorsal foot ulcer 02/09/2019; patient is here for review of the wound on the left lateral foot roughly at the level of the base of the fifth metatarsal History of Present Illness (HPI) 10/04/16 on evaluation today patient presents for initial evaluation concerning a laceration of the right wrist and hand on the bowler aspect. He tells me that he fell into glass following a medication change in regard to his diabetes. He initially went to [facility] where she was sutured and subsequently his daughter who is a Marine scientist removed the teachers at the appropriate time. Unfortunately the wound has done poorly and dehissed which has caused him some trouble including infection. He has been on antibiotics both topically and orally though the wound continues to show signs of necrotic tissue according to notes  which is what he was sent to [place] for evaluation concerning. He does have diabetes and unfortunately this is severely uncontrolled. He does see an endocrinologist and is working on this. 10/11/2016 -- the patient's notes from the ER were reviewed and he had his suturing done on 09/17/2016 and had necrotic skin flaps which had to be trimmed the last visit he was here. He has managed to get his Santyl ointment and has been using this appropriately. 10/21/16 patient's wound appears to be doing very well on evaluation today. He still has some Slough covering the wound bed but this is nowhere near as severe as when i saw this initially two weeks ago nor even my review of his pictures last week. I'm pleased with how this is progressing. Patient's wound over the right form appears to be doing fairly well on evaluation today. There is no evidence of infection and he has a small area at the crease of where she is wrist extends and flexes that is still open and appears to be having difficulty due to the fact that it is cracking open. I think this is something that may be controlled with moisture control that is keeping the wound a little bit more moist. Nonetheless this is a tiny region compared to the initial wound and overall appears to be doing very well. No fevers, chills, nausea, or vomiting noted at this time. 11/25/16 on evaluation today patient appears to be doing well in regard to his right wrist ulcer. In fact this appears to be completely healed he is having no discomfort. He is pleased with how this is progressed. Readmission: 06/24/17 on evaluation today patient appears back in our clinic regarding issues that he has been having with his right ankle on the lateral malleolus as well is in the dorsal surface of his left foot. Both have been going on for several weeks unfortunately. He tells me that initially this was being managed by Dr. Edilia Bo and I did review the notes from Dr. Edilia Bo in epic.  Patient was placed on amoxicillin initially and then subsequently Keflex following. He has been using triple antibiotic ointment over-the-counter along with a Band-Aid for the wound currently. He did not have any Santyl remaining. His most recent hemoglobin A1c which was just two days ago was 10.3. Fortunately he seems to have excellent blood flow  in the bilateral lower extremities with his left ABI being 0.95 in the right ABI 1.01. Patient does have some pain in regard to each of these ulcerations but he tells me it's not nearly as severe as the hand wound that I help treat earlier over the summer. This was in 2018. 07/15/17 on evaluation today patient's lower surety ulcer is actually both appear to be doing fairly well at this point. He does have a little bit of skin overlapping the ulcer on the right lateral ankle this was debrided away today just with saline and gauze without complication patient had no discomfort or pain. Otherwise patient's left foot ulcer does appear to be showing signs of improvement he does seem to have a right great toe. Nikki and noted at this point in time. I think that this does need to be managed at this point. No fevers, chills, nausea, or vomiting noted at this time. 07/22/17 on evaluation today patient's wounds in regard to his lower extremities appear to be doing somewhat better. His right ankle wound in fact appears to likely be completely closed. The left dorsal foot ulcer though still open does appear to be a little bit smaller and does also seem to be making good progress. He has not been having problems with the treatment at this point in the general he does not seem to show as much erythema surrounding the wound bed which is good news. He is not having significant discomfort. 08/12/17 on evaluation today patients wounds of both appeared to be doing better in fact they both appear to be completely healed. Overall I'm very pleased with how things have progressed over  the past week even more than what I expected. Obviously this is good news and he is happy about this. He did have a fall when going to his orthopedic doctor recently fortunately he does not appear to have injured anything severely but nonetheless does have a couple of bumps and bruises nothing significant at this time but he needs to be seen and wound care for. He may have broken his left wrist he is following up with orthopedics in that regard. Readmission: 10/24/17 on evaluation today patient presents for reevaluation after having been discharged Aug 12, 2017. His wound was healed at that point although he states that it has been somewhat discolored since that time. He has not experienced the drainage as far as the wound is concerned but the fact that it was still discolored been greater than two months after the fact made him want to come in for reevaluation. Upon inspection today the patient has no pain although he does have neuropathy. He does have a discolored region of the dorsal surface of the left foot which was the site where the ulcer was [name], [name]. ([id number]) that we previously treated. The good news is he does not again have any discomfort although obviously I do believe this may be some scar tissue and just discoloration in that regard. Again he hasn't had any drainage or discharge since I last saw him and at this point I see no evidence of that either this area shows complete epithelialization. In general I do not feel like there's any significant issue at this point in that regard. 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 He has been in this clinic before in 2018 with a laceration on his right hand and then again in 2019 with an area over the  right lateral malleolus. 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 He  states that the wound is been there for the last week or 2. It is been uncomfortable to walk on. The man is an active man having to look after an elderly mother. Past medical history, type 2 diabetes with polyneuropathy with a recent hemoglobin A1c in September 2011 0.3, lumbar disc disease, hypergonadism., Left foot drop and gout. We did not repeat his ABIs in the clinic quoting values from July 2019 of 0.95 and 1.01. He is not felt to have significant PAD 02/19/2019 on evaluation today patient actually appears to be doing better with regard to his foot wound based on what I am seeing currently. He was seen by Dr. Dellia Nims on readmission back into the clinic last week when I was off. The good news is the patient has been tolerating the dressing changes without any complication in fact has not really had much in the way of drainage. He does have an AFO brace which she believes is actually pushing on this area as well and has contributed to the issue. Fortunately there is no signs of active infection at this point. His ABIs appear to be well. 03/05/2019 on evaluation today patient appears to be doing well in regard to his foot ulcer. He has been tolerating the dressing changes without complication. Fortunately there is no signs of active infection. The wound is measuring somewhat smaller and overall I feel like he is making good progress at this point. 03/12/2019 on evaluation today patient actually appears to be doing quite well with regard to his foot ulcer. The wound is looking better the measurement is not significantly better at this point unfortunately but nonetheless again the overall appearance is improving I think he is making some progress. I still think the big issue here is foot drop and again the patient wants to see if potentially he could get a brace to help with this. Subsequently I think that we can definitely make a referral to [facility] to see if they can help [place] in this regard. He is  in agreement with that plan. 03/26/2019 on evaluation today patient appears to be doing more poorly in regard to his foot ulcer at this time. He did see podiatry in order to see about getting a brace but unfortunately he tells me that Medicare is not likely to pay for one for him as he had 1 I guess 8 months ago. However this one that apparently goes in his shoes that does not seem to be working out well for him. Unfortunately there are signs of active infection at this time. No fevers, chills, nausea, vomiting, or diarrhea. Systemically. The patient has erythema on the dorsal surface of his foot that tracks from the lateral portion of his foot and again he does have a blister around the wound as well which is worse compared to previous. Overall I am concerned. 04/02/2019 on evaluation today patient appears to be doing about the same with regard to his wound. Unfortunately there is no signs of significant improvement although I do think there is no signs of infection which is good news. His drainage is also slowed down tremendously. I think at this point he would benefit from the initiation of a total contact cast. The only concern I have is that over his balance. I explained to the patient that if we  were to initiate the cast I think it would be greatly beneficial for him but he would need to ensure not to walk at all unless he uses a walker he actually has 2 walkers one for inside his home and one that he keeps out in the car. For that reason he would be covered in that regard. I just do not want him to have any accidents and fall with the cast. 12/28-Patient comes in 1 week out a day early for his appointment he was in the hospital for 2 days for uncontrolled hyperglycemia and required management of high blood sugars, with some adjustments in his home regimen of insulin, patient stubbed his right great toe today and put a Band-Aid on it, he is here for his left lateral foot ulcer and we are using  silver alginate dressing, he was intact total contact cast with difficulty with balance to the extent that that had to be discontinued 04/16/2019 on evaluation today patient appears to be doing better with regard to the overall appearance of his wound size. This is good news. We had attempted a total contact cast but unfortunately he had some falling issues that the family contacted [place] about we took the cast off we did not put it back on. Nonetheless it ended up that just a couple days later he was in the hospital due to elevations in his blood sugar which went to around 500 and even a little higher. Subsequently he was admitted to the hospital and was in the hospital for a total of 3 days and that included Christmas day. Fortunately there got his blood sugar under control he tells me that this is running about 200 now which is much better news. There does not appear to be any signs of active infection at this time. No fevers, chills, nausea, vomiting, or diarrhea. 04/30/2019 on evaluation today patient actually appears to be doing slightly worse compared to previous as far as the overall size of his wound is concerned. There also is some need for sharp debridement today as well. Fortunately there is no evidence of active infection which is good news. No fevers, chills, nausea, vomiting, or diarrhea. 05/07/2019 upon evaluation today patient appears to be doing excellent in regard to his plantar foot ulcer. He has been tolerating the dressing changes without complication. Fortunately there is no signs of active infection at this time. No fevers, chills, nausea, vomiting, or diarrhea. He does tell me that he actually got in touch with [employer] and they feel like they can add diagnosis codes to his orders in order to get an external brace for him as opposed to the one that goes internal to issue. That would be excellent for his foot drop and I did tell him that if there is anything I need to fill out in that  regard I will be more than happy to oblige and fill out what is needed for [employer]. 05/14/2019 upon evaluation today patient's original wound site actually appears to be doing much better which is good news. Fortunately there is no signs of active infection at this time. No fevers, chills, nausea, vomiting, or diarrhea. With that being said the patient unfortunately is continuing to have issues with rubbing he tells me that the felt slid on his foot although he did not realize it until today. Nonetheless I think this did cause a blister around the edge of the wound and he does an open wound that is slightly larger overall in measurement compared  to last time mainly due to this blister. Other than that I feel like he is doing quite well. [name], [name] ([id number]) 05/21/2019 upon evaluation today patient appears to be doing excellent in regard to his wound at this time on the plantar foot. He is showing signs of improvement he does have some slough and biofilm noted on the surface of the wound this is going require some sharp debridement today as well. Fortunately there is no evidence of active infection. No fevers, chills, nausea, vomiting, or diarrhea. 05/28/2019 on evaluation today patient's wound actually appears to be measuring smaller upon initial inspection and I do believe that it is doing well. With that being said he continues to have a blister on the outside/lateral edge of the wound that occurs as a result of rubbing due to his dropfoot currently. Subsequently we have filled out the paperwork for his new external brace but again that does take several weeks to get that completed once everything is returned to back we did send this back last week as far as the paperwork was concerned. With that being said there does not appear to be any signs of active infection at this time. 06/04/2019 on evaluation today patient's wound actually appears to be doing slightly better compared to last week which  is good news. He still states that his insurance unfortunately does not seem to want to cover any of his new brace. He is going to just pay for this himself. Nonetheless he is going by there today when he leaves here that is [employer] in [place] that is going to be making this brace for him. Objective Constitutional Vitals Time Taken: 10:52 AM, Height: 71 in, Weight: 205 lbs, BMI: 28.6, Temperature: 98.7 F, Pulse: 82 bpm, Respiratory Rate: 16 breaths/min, Blood Pressure: 153/74 mmHg. Integumentary (Hair, Skin) Wound #5 status is Open. Original cause of wound was Pressure Injury. The wound is located on the Left,Lateral Foot. The wound measures 0.8cm length x 1.2cm width x 0.1cm depth; 0.754cm^2 area and 0.075cm^3 volume. There is Fat Layer (Subcutaneous Tissue) Exposed exposed. There is no tunneling or undermining noted. There is a medium amount of serosanguineous drainage noted. The wound margin is flat and intact. There is large (67-100%) pink granulation within the wound bed. There is a small (1-33%) amount of necrotic tissue within the wound bed including Adherent Slough. 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 There was a Excisional Skin/Subcutaneous Tissue Debridement with a total area of 0.96 sq cm performed by [name], Caylah Plouff E., [name]. With the following instrument(s): Curette to remove Viable and Non-Viable tissue/material. Material removed includes Callus, Subcutaneous Tissue, and Slough after achieving pain control using Lidocaine. No specimens were taken. A time out was conducted at 11:05, prior to  the start of the procedure. A Minimum amount of bleeding was controlled with Pressure. The procedure was tolerated well. Post Debridement Measurements: 0.8cm length x 1.2cm width x 0.2cm depth; 0.151cm^3 volume. Character of Wound/Ulcer Post Debridement is stable. Severity of Tissue Post Debridement is: Fat layer exposed. Post procedure Diagnosis Wound #5: Same as Pre-Procedure [name], [name] ([id number]) Plan Wound Cleansing: Wound #5 Left,Lateral Foot: May shower  with protection. Anesthetic (add to Medication List): Wound #5 Left,Lateral Foot: Topical Lidocaine 4% cream applied to wound bed prior to debridement (In Clinic Only). Primary Wound Dressing: Wound #5 Left,Lateral Foot: Silver Collagen Secondary Dressing: Wound #5 Left,Lateral Foot: Conform/Kerlix - Rolled gauze Other - Felt to offload Dressing Change Frequency: Wound #5 Left,Lateral Foot: Change dressing every other day. Follow-up Appointments: Wound #5 Left,Lateral Foot: Return Appointment in 1 week. Edema Control: Wound #5 Left,Lateral Foot: Elevate legs to the level of the heart and pump ankles as often as possible Additional Orders / Instructions: Wound #5 Left,Lateral Foot: Other: - Patient to follow-up with [employer] for adjustments to braces and shoes. Requested paperwork sent to [employer]. 1. I would recommend currently that we continue with the silver collagen I think that is doing well for him and the patient is in agreement the plan. 2. I am also can recommend at this time that we also continue with the offloading felt and padding to help keep pressure off the area he seems to be doing fairly well with this. 3. I do think once he gets his foot drop brace that will be helpful in keeping him from putting as much pressure on the external portion of his foot laterally this will make a big difference as far as healing is concerned as well. We will see patient back for reevaluation in 1 week here in the clinic.  If anything worsens or changes patient will contact our office for additional recommendations. Electronic Signature(s) Signed: 06/04/2019 2:23:05 PM By: Worthy Keeler [name] Entered By: Worthy Keeler on 06/04/2019 14:23:05 [name] ([id number]) -------------------------------------------------------------------------------- SuperBill Details Patient Name: [name] Date of Service: 06/04/2019 Medical Record Number: [id number] Patient Account Number: [id number] Date of Birth/Sex: 1945-07-22 (73 y.o. M) Treating RN: Cornell Barman Primary Care Provider: [name] Other Clinician: Referring Provider: [name] Treating Provider/Extender: Melburn Hake, Myleigh Amara Weeks in Treatment: 16 Diagnosis Coding ICD-10 Codes Code Description E11.621 Type 2 diabetes mellitus with foot ulcer E11.42 Type 2 diabetes mellitus with diabetic polyneuropathy L97.522 Non-pressure chronic ulcer of other part of left foot with fat layer exposed M21.372 Foot drop, left foot Facility Procedures CPT4 Code: 63016010 Description: 93235 - DEB SUBQ TISSUE 20 SQ CM/< Modifier: Quantity: 1 CPT4 Code: Description: ICD-10 Diagnosis Description L97.522 Non-pressure chronic ulcer of other part of left foot with fat layer exposed Modifier: Quantity: Physician Procedures CPT4 Code: 5732202 Description: 11042 - WC PHYS SUBQ TISS 20 SQ CM Modifier: Quantity: 1 CPT4 Code: Description: ICD-10 Diagnosis Description L97.522 Non-pressure chronic ulcer of other part of left foot with fat layer exposed Modifier: Quantity: Electronic Signature(s) Signed: 06/04/2019 2:23:14 PM By: Worthy Keeler [name] Entered By: Worthy Keeler on 06/04/2019 14:23:13

## 2019-06-05 ENCOUNTER — Ambulatory Visit (INDEPENDENT_AMBULATORY_CARE_PROVIDER_SITE_OTHER): Payer: Medicare Other

## 2019-06-05 DIAGNOSIS — E291 Testicular hypofunction: Secondary | ICD-10-CM | POA: Diagnosis not present

## 2019-06-05 MED ORDER — TESTOSTERONE CYPIONATE 200 MG/ML IM SOLN
150.0000 mg | Freq: Once | INTRAMUSCULAR | Status: AC
  Administered 2019-06-05: 150 mg via INTRAMUSCULAR

## 2019-06-05 NOTE — Progress Notes (Signed)
Per orders of Dr. Lorelei Pont, injection of Testosterone Cypionate 200mg /ml 0.49ml given by Pilar Grammes, CMA in Left Upper Outer Quadrant. Patient tolerated injection well.  Sending to Dr Diona Browner in Dr Copland's absence.

## 2019-06-11 ENCOUNTER — Encounter: Payer: Medicare Other | Admitting: Physician Assistant

## 2019-06-12 ENCOUNTER — Ambulatory Visit: Payer: Medicare Other

## 2019-06-14 ENCOUNTER — Ambulatory Visit: Payer: Medicare Other

## 2019-06-14 NOTE — Progress Notes (Signed)
Discussed brace options for foot drop

## 2019-06-15 ENCOUNTER — Ambulatory Visit: Payer: Medicare Other | Attending: Internal Medicine

## 2019-06-15 DIAGNOSIS — Z23 Encounter for immunization: Secondary | ICD-10-CM | POA: Insufficient documentation

## 2019-06-15 NOTE — Progress Notes (Signed)
   Covid-19 Vaccination Clinic  Name:  [name]    [name]: [id number] DOB: October 13, 1945  06/15/2019  [name] was observed post Covid-19 immunization for 15 minutes without incident. He was provided with Vaccine Information Sheet and instruction to access the V-Safe system.   [name] was instructed to call 911 with any severe reactions post vaccine: [name] Kitchen Difficulty breathing  . Swelling of face and throat  . A fast heartbeat  . A bad rash all over body  . Dizziness and weakness   Immunizations Administered    Name Date Dose VIS Date Route   Pfizer COVID-19 Vaccine 06/15/2019  4:07 PM 0.3 mL 03/23/2019 Intramuscular   Manufacturer: [place]   Lot: [id number]   [place]: [id number]

## 2019-06-15 NOTE — Progress Notes (Signed)
LAYSON, BERTSCH ([id number]) Visit Report for 06/04/2019 Arrival Information Details Patient Name: [name], [name] Date of Service: 06/04/2019 10:45 AM Medical Record Number: [id number] Patient Account Number: [id number] Date of Birth/Sex: [birth year] (74 y.o. M) Treating RN: Montey Hora Primary Care Olive Motyka: [name] Other Clinician: Referring Ryot Burrous: [name] Treating Jailene Cupit/Extender: Melburn Hake, HOYT Weeks in Treatment: 16 Visit Information History Since Last Visit Added or deleted any medications: No Patient Arrived: Cane Any new allergies or adverse reactions: No Arrival Time: 10:48 Had a fall or experienced change in No Accompanied By: self activities of daily living that may affect Transfer Assistance: None risk of falls: Patient Identification Verified: Yes Signs or symptoms of abuse/neglect since last visito No Secondary Verification Process Completed: Yes Hospitalized since last visit: No Patient Has Alerts: Yes Implantable device outside of the clinic excluding No Patient Alerts: DMII cellular tissue based products placed in the center since last visit: Has Dressing in Place as Prescribed: Yes Pain Present Now: No Electronic Signature(s) Signed: 06/04/2019 4:53:18 PM By: Montey Hora Entered By: Montey Hora on 06/04/2019 10:50:47 [name] ([id number]) -------------------------------------------------------------------------------- Lower Extremity Assessment Details Patient Name: [name] Date of Service: 06/04/2019 10:45 AM Medical Record Number: [id number] Patient Account Number: [id number] Date of Birth/Sex: 03-14-1946 (73 y.o. M) Treating RN: Montey Hora Primary Care Ranell Skibinski: [name] Other Clinician: Referring Aalijah Mims: [name] Treating Glorian Mcdonell/Extender: [name], HOYT Weeks in Treatment: 16 Edema Assessment Assessed: [Left: No] [Right: No] Edema: [Left: N] [Right: o] Vascular  Assessment Pulses: Dorsalis Pedis Palpable: [Left:Yes] Electronic Signature(s) Signed: 06/04/2019 4:53:18 PM By: Montey Hora Entered By: Montey Hora on 06/04/2019 10:56:55 [name] ([id number]) -------------------------------------------------------------------------------- Multi Wound Chart Details Patient Name: [name] Date of Service: 06/04/2019 10:45 AM Medical Record Number: [id number] Patient Account Number: [id number] Date of Birth/Sex: 05/11/1945 (73 y.o. M) Treating RN: Cornell Barman Primary Care Xavion Muscat: [name] Other Clinician: Referring Tarren Sabree: [name] Treating Hakan Nudelman/Extender: Melburn Hake, HOYT Weeks in Treatment: 16 Vital Signs Height(in): 71 Pulse(bpm): 42 Weight(lbs): 205 Blood Pressure(mmHg): 153/74 Body Mass Index(BMI): 29 Temperature(F): 98.7 Respiratory Rate(breaths/min): 16 Photos: [N/A:N/A] Wound Location: Left Foot - Lateral N/A N/A Wounding Event: Pressure Injury N/A N/A Primary Etiology: Diabetic Wound/Ulcer of the Lower N/A N/A Extremity Comorbid History: Cataracts, Middle ear problems, N/A N/A Sleep Apnea, Hypertension, Type II Diabetes, Gout, Osteoarthritis, Neuropathy Date Acquired: 01/19/2019 N/A N/A Weeks of Treatment: 16 N/A N/A Wound Status: Open N/A N/A Measurements L x W x D (cm) 0.8x1.2x0.1 N/A N/A Area (cm) : 0.754 N/A N/A Volume (cm) : 0.075 N/A N/A % Reduction in Area: -174.20% N/A N/A % Reduction in Volume: 8.50% N/A N/A Classification: Grade 2 N/A N/A Exudate Amount: Medium N/A N/A Exudate Type: Serosanguineous N/A N/A Exudate Color: red, brown N/A N/A Wound Margin: Flat and Intact N/A N/A Granulation Amount: Large (67-100%) N/A N/A Granulation Quality: Pink N/A N/A Necrotic Amount: Small (1-33%) N/A N/A Exposed Structures: Fat Layer (Subcutaneous Tissue) N/A N/A Exposed: Yes Fascia: No Tendon: No Muscle: No Joint: No Bone: No Epithelialization: Small (1-33%) N/A N/A Treatment  Notes Electronic Signature(s) Signed: 06/15/2019 5:40:44 PM By: Gretta Cool, BSN, RN, CWS, Kim RN, BSN [address], [place] EMarland Kitchen ([id number]) Entered By: Gretta Cool, BSN, RN, CWS, Kim on 06/04/2019 11:08:46 [name] ([id number]) -------------------------------------------------------------------------------- Multi-Disciplinary Care Plan Details Patient Name: [name] Date of Service: 06/04/2019 10:45 AM Medical Record Number: [id number] Patient Account Number: [id number] Date of Birth/Sex: Jan 05, 1946 (73 y.o. M) Treating RN: Cornell Barman Primary Care Juliza Machnik: [name]  Other Clinician: Referring Laverda Stribling: [name] Treating Anhad Sheeley/Extender: [name], HOYT Weeks in Treatment: 16 Active Inactive Pressure Nursing Diagnoses: Knowledge deficit related to management of pressures ulcers Goals: Patient will remain free from development of additional pressure ulcers Date Initiated: 04/30/2019 Target Resolution Date: 05/14/2019 Goal Status: Active Patient/caregiver will verbalize understanding of pressure ulcer management Date Initiated: 04/30/2019 Target Resolution Date: 05/14/2019 Goal Status: Active Interventions: Assess: immobility, friction, shearing, incontinence upon admission and as needed Treatment Activities: Patient referred for pressure reduction/relief devices : 04/30/2019 Notes: Wound/Skin Impairment Nursing Diagnoses: Impaired tissue integrity Goals: Ulcer/skin breakdown will have a volume reduction of 30% by week 4 Date Initiated: 04/30/2019 Target Resolution Date: 05/31/2019 Goal Status: Active Interventions: Assess patient/caregiver ability to obtain necessary supplies Assess ulceration(s) every visit Treatment Activities: Referred to DME Janijah Symons for dressing supplies : 04/30/2019 Skin care regimen initiated : 04/30/2019 Topical wound management initiated : 04/30/2019 Notes: Electronic Signature(s) Signed: 06/15/2019 5:40:44 PM By: Gretta Cool, BSN, RN, CWS, Kim RN,  BSN Entered By: Gretta Cool, BSN, RN, CWS, Kim on 06/04/2019 11:07:27 [name] ([id number]) -------------------------------------------------------------------------------- Pain Assessment Details Patient Name: [name] Date of Service: 06/04/2019 10:45 AM Medical Record Number: [id number] Patient Account Number: [id number] Date of Birth/Sex: 1945/08/01 (73 y.o. M) Treating RN: Montey Hora Primary Care Babe Clenney: [name] Other Clinician: Referring Leola Fiore: [name] Treating Kodey Xue/Extender: Melburn Hake, HOYT Weeks in Treatment: 16 Active Problems Location of Pain Severity and Description of Pain Patient Has Paino Yes Site Locations Rate the pain. Current Pain Level: 2 Pain Management and Medication Current Pain Management: Electronic Signature(s) Signed: 06/04/2019 4:53:18 PM By: Montey Hora Entered By: Montey Hora on 06/04/2019 10:51:01 [name] ([id number]) -------------------------------------------------------------------------------- Patient/Caregiver Education Details Patient Name: [name] Date of Service: 06/04/2019 10:45 AM Medical Record Number: [id number] Patient Account Number: [id number] Date of Birth/Gender: 1946/02/10 (73 y.o. M) Treating RN: Cornell Barman Primary Care Physician: [name] Other Clinician: Referring Physician: [name] Treating Physician/Extender: Sharalyn Ink in Treatment: 16 Education Assessment Education Provided To: Patient Education Topics Provided Wound/Skin Impairment: Handouts: Caring for Your Ulcer Methods: Demonstration, Explain/Verbal Responses: State content correctly Electronic Signature(s) Signed: 06/15/2019 5:40:44 PM By: Gretta Cool, BSN, RN, CWS, Kim RN, BSN Entered By: Gretta Cool, BSN, RN, CWS, Kim on 06/04/2019 11:12:29 [name] ([id number]) -------------------------------------------------------------------------------- Wound Assessment Details Patient  Name: [name] Date of Service: 06/04/2019 10:45 AM Medical Record Number: [id number] Patient Account Number: [id number] Date of Birth/Sex: 1945-12-26 (73 y.o. M) Treating RN: Montey Hora Primary Care Jvion Turgeon: [name] Other Clinician: Referring Arth Nicastro: [name] Treating Khylon Davies/Extender: [name], HOYT Weeks in Treatment: 16 Wound Status Wound Number: 5 Primary Diabetic Wound/Ulcer of the Lower Extremity Etiology: Wound Location: Left Foot - Lateral Wound Open Wounding Event: Pressure Injury Status: Date Acquired: 01/19/2019 Comorbid Cataracts, Middle ear problems, Sleep Apnea, Weeks Of Treatment: 16 History: Hypertension, Type II Diabetes, Gout, Osteoarthritis, Clustered Wound: No Neuropathy Photos Wound Measurements Length: (cm) 0.8 Width: (cm) 1.2 Depth: (cm) 0.1 Area: (cm) 0.754 Volume: (cm) 0.075 % Reduction in Area: -174.2% % Reduction in Volume: 8.5% Epithelialization: Small (1-33%) Tunneling: No Undermining: No Wound Description Classification: Grade 2 Wound Margin: Flat and Intact Exudate Amount: Medium Exudate Type: Serosanguineous Exudate Color: red, brown Foul Odor After Cleansing: No Slough/Fibrino Yes Wound Bed Granulation Amount: Large (67-100%) Exposed Structure Granulation Quality: Pink Fascia Exposed: No Necrotic Amount: Small (1-33%) Fat Layer (Subcutaneous Tissue) Exposed: Yes Necrotic Quality: Adherent Slough Tendon Exposed: No Muscle Exposed: No Joint Exposed: No Bone Exposed:  No Electronic Signature(s) Signed: 06/04/2019 4:53:18 PM By: Montey Hora Entered By: Montey Hora on 06/04/2019 10:56:44 [name] ([id number]) -------------------------------------------------------------------------------- [place] Details Patient Name: [name] Date of Service: 06/04/2019 10:45 AM Medical Record Number: [id number] Patient Account Number: [id number] Date of Birth/Sex: 1945-10-18 (73 y.o.  M) Treating RN: Montey Hora Primary Care Japleen Tornow: [name] Other Clinician: Referring Lexy Meininger: [name] Treating Malayla Granberry/Extender: Melburn Hake, HOYT Weeks in Treatment: 16 Vital Signs Time Taken: 10:52 Temperature (F): 98.7 Height (in): 71 Pulse (bpm): 82 Weight (lbs): 205 Respiratory Rate (breaths/min): 16 Body Mass Index (BMI): 28.6 Blood Pressure (mmHg): 153/74 Reference Range: 80 - 120 mg / dl Electronic Signature(s) Signed: 06/04/2019 4:53:18 PM By: Montey Hora Entered By: Montey Hora on 06/04/2019 10:52:45

## 2019-06-18 ENCOUNTER — Other Ambulatory Visit: Payer: Self-pay

## 2019-06-18 ENCOUNTER — Encounter: Payer: Medicare Other | Attending: Physician Assistant | Admitting: Physician Assistant

## 2019-06-18 DIAGNOSIS — E11621 Type 2 diabetes mellitus with foot ulcer: Secondary | ICD-10-CM | POA: Insufficient documentation

## 2019-06-18 DIAGNOSIS — M1990 Unspecified osteoarthritis, unspecified site: Secondary | ICD-10-CM | POA: Insufficient documentation

## 2019-06-18 DIAGNOSIS — M21372 Foot drop, left foot: Secondary | ICD-10-CM | POA: Diagnosis not present

## 2019-06-18 DIAGNOSIS — E1142 Type 2 diabetes mellitus with diabetic polyneuropathy: Secondary | ICD-10-CM | POA: Diagnosis not present

## 2019-06-18 DIAGNOSIS — G4730 Sleep apnea, unspecified: Secondary | ICD-10-CM | POA: Diagnosis not present

## 2019-06-18 DIAGNOSIS — I10 Essential (primary) hypertension: Secondary | ICD-10-CM | POA: Diagnosis not present

## 2019-06-18 DIAGNOSIS — Z794 Long term (current) use of insulin: Secondary | ICD-10-CM | POA: Insufficient documentation

## 2019-06-18 DIAGNOSIS — M109 Gout, unspecified: Secondary | ICD-10-CM | POA: Diagnosis not present

## 2019-06-18 DIAGNOSIS — L97522 Non-pressure chronic ulcer of other part of left foot with fat layer exposed: Secondary | ICD-10-CM | POA: Diagnosis not present

## 2019-06-19 NOTE — Progress Notes (Signed)
[name] ([id number]) Visit Report for 06/18/2019 Arrival Information Details Patient Name: [name] Date of Service: 06/18/2019 11:00 AM Medical Record Number: [id number] Patient Account Number: [id number] Date of Birth/Sex: 1945/11/18 (73 y.o. M) Treating RN: Cornell Barman Primary Care [name]: [name] Other Clinician: Referring Aamori Mcmasters: [name] Treating Alfredia Desanctis/Extender: Melburn Hake, HOYT Weeks in Treatment: 18 Visit Information History Since Last Visit Added or deleted any medications: No Patient Arrived: Cane Any new allergies or adverse reactions: No Arrival Time: 11:04 Had a fall or experienced change in No Accompanied By: self activities of daily living that may affect Transfer Assistance: None risk of falls: Patient Identification Verified: Yes Signs or symptoms of abuse/neglect since last visito No Secondary Verification Process Completed: Yes Hospitalized since last visit: No Patient Has Alerts: Yes Implantable device outside of the clinic excluding No Patient Alerts: DMII cellular tissue based products placed in the center since last visit: Has Dressing in Place as Prescribed: Yes Pain Present Now: No Electronic Signature(s) Signed: 06/18/2019 5:15:52 PM By: Gretta Cool, BSN, RN, CWS, Kim RN, BSN Entered By: Gretta Cool, BSN, RN, CWS, Kim on 06/18/2019 11:05:21 [name] ([id number]) -------------------------------------------------------------------------------- Lower Extremity Assessment Details Patient Name: [name] Date of Service: 06/18/2019 11:00 AM Medical Record Number: [id number] Patient Account Number: [id number] Date of Birth/Sex: Jan 21, 1946 (73 y.o. M) Treating RN: Cornell Barman Primary Care Naomie Crow: [name] Other Clinician: Referring Leilene Diprima: [name] Treating Deirdre Gryder/Extender: Worthy Keeler Weeks in Treatment: 18 Vascular Assessment Pulses: Dorsalis Pedis Palpable: [Left:Yes] Electronic  Signature(s) Signed: 06/18/2019 5:15:52 PM By: Gretta Cool, BSN, RN, CWS, Kim RN, BSN Entered By: Gretta Cool, BSN, RN, CWS, Kim on 06/18/2019 11:12:40 [name] ([id number]) -------------------------------------------------------------------------------- Multi Wound Chart Details Patient Name: [name] Date of Service: 06/18/2019 11:00 AM Medical Record Number: [id number] Patient Account Number: [id number] Date of Birth/Sex: May 07, 1945 (73 y.o. M) Treating RN: Army Melia Primary Care Kailin Leu: [name] Other Clinician: Referring Lamya Lausch: [name] Treating Kadyn Chovan/Extender: Melburn Hake, HOYT Weeks in Treatment: 18 Vital Signs Height(in): 71 Pulse(bpm): 103 Weight(lbs): 205 Blood Pressure(mmHg): 168/88 Body Mass Index(BMI): 29 Temperature(F): 98.3 Respiratory Rate(breaths/min): 16 Photos: [N/A:N/A] Wound Location: Left, Lateral Foot N/A N/A Wounding Event: Pressure Injury N/A N/A Primary Etiology: Diabetic Wound/Ulcer of the Lower N/A N/A Extremity Comorbid History: Cataracts, Middle ear problems, N/A N/A Sleep Apnea, Hypertension, Type II Diabetes, Gout, Osteoarthritis, Neuropathy Date Acquired: 01/19/2019 N/A N/A Weeks of Treatment: 18 N/A N/A Wound Status: Open N/A N/A Measurements L x W x D (cm) 0.4x0.5x0.1 N/A N/A Area (cm) : 0.157 N/A N/A Volume (cm) : 0.016 N/A N/A % Reduction in Area: 42.90% N/A N/A % Reduction in Volume: 80.50% N/A N/A Starting Position 1 (o'clock): 5 Ending Position 1 (o'clock): 10 Maximum Distance 1 (cm): 0.5 Undermining: Yes N/A N/A Classification: Grade 2 N/A N/A Exudate Amount: Medium N/A N/A Exudate Type: Serosanguineous N/A N/A Exudate Color: red, brown N/A N/A Wound Margin: Flat and Intact N/A N/A Granulation Amount: Large (67-100%) N/A N/A Granulation Quality: Pink N/A N/A Necrotic Amount: Small (1-33%) N/A N/A Exposed Structures: Fat Layer (Subcutaneous Tissue) N/A N/A Exposed: Yes Fascia: No Tendon:  No Muscle: No Joint: No Bone: No Epithelialization: Medium (34-66%) N/A N/A Treatment Notes [name] ([id number]) Electronic Signature(s) Signed: 06/18/2019 11:34:57 AM By: Army Melia Entered By: Army Melia on 06/18/2019 11:34:57 [name] ([id number]) -------------------------------------------------------------------------------- Multi-Disciplinary Care Plan Details Patient Name: [name] Date of Service: 06/18/2019 11:00 AM Medical Record Number: [id number] Patient Account Number: [id number] Date of  Birth/Sex: 1946/04/08 (74 y.o. M) Treating RN: Army Melia Primary Care Caidance Sybert: [name] Other Clinician: Referring Ariza Evans: [name] Treating Shelita Steptoe/Extender: Melburn Hake, HOYT Weeks in Treatment: 18 Active Inactive Pressure Nursing Diagnoses: Knowledge deficit related to management of pressures ulcers Goals: Patient will remain free from development of additional pressure ulcers Date Initiated: 04/30/2019 Target Resolution Date: 05/14/2019 Goal Status: Active Patient/caregiver will verbalize understanding of pressure ulcer management Date Initiated: 04/30/2019 Target Resolution Date: 05/14/2019 Goal Status: Active Interventions: Assess: immobility, friction, shearing, incontinence upon admission and as needed Treatment Activities: Patient referred for pressure reduction/relief devices : 04/30/2019 Notes: Wound/Skin Impairment Nursing Diagnoses: Impaired tissue integrity Goals: Ulcer/skin breakdown will have a volume reduction of 30% by week 4 Date Initiated: 04/30/2019 Target Resolution Date: 05/31/2019 Goal Status: Active Interventions: Assess patient/caregiver ability to obtain necessary supplies Assess ulceration(s) every visit Treatment Activities: Referred to DME Carneshia Raker for dressing supplies : 04/30/2019 Skin care regimen initiated : 04/30/2019 Topical wound management initiated : 04/30/2019 Notes: Electronic  Signature(s) Signed: 06/18/2019 11:34:42 AM By: Army Melia Entered By: Army Melia on 06/18/2019 11:34:41 [name] ([id number]) -------------------------------------------------------------------------------- Pain Assessment Details Patient Name: [name] Date of Service: 06/18/2019 11:00 AM Medical Record Number: [id number] Patient Account Number: [id number] Date of Birth/Sex: [birth year] (73 y.o. M) Treating RN: Cornell Barman Primary Care Jona Erkkila: [name] Other Clinician: Referring Franciso Dierks: [name] Treating Verner Kopischke/Extender: Melburn Hake, HOYT Weeks in Treatment: 18 Active Problems Location of Pain Severity and Description of Pain Patient Has Paino No Site Locations Pain Management and Medication Current Pain Management: Notes Patient denies pain at this time. Electronic Signature(s) Signed: 06/18/2019 5:15:52 PM By: Gretta Cool, BSN, RN, CWS, Kim RN, BSN Entered By: Gretta Cool, BSN, RN, CWS, Kim on 06/18/2019 11:07:04 [name] ([id number]) -------------------------------------------------------------------------------- Wound Assessment Details Patient Name: [name] Date of Service: 06/18/2019 11:00 AM Medical Record Number: [id number] Patient Account Number: [id number] Date of Birth/Sex: June 18, 1945 (73 y.o. M) Treating RN: Cornell Barman Primary Care Ola Fawver: [name] Other Clinician: Referring Almadelia Looman: [name] Treating Demetra Moya/Extender: Melburn Hake, HOYT Weeks in Treatment: 18 Wound Status Wound Number: 5 Primary Diabetic Wound/Ulcer of the Lower Extremity Etiology: Wound Location: Left, Lateral Foot Wound Open Wounding Event: Pressure Injury Status: Date Acquired: 01/19/2019 Comorbid Cataracts, Middle ear problems, Sleep Apnea, Weeks Of Treatment: 18 History: Hypertension, Type II Diabetes, Gout, Osteoarthritis, Clustered Wound: No Neuropathy Photos Wound Measurements Length: (cm) 0.4 Width: (cm) 0.5 Depth: (cm)  0.1 Area: (cm) 0.157 Volume: (cm) 0.016 % Reduction in Area: 42.9% % Reduction in Volume: 80.5% Epithelialization: Medium (34-66%) Tunneling: No Undermining: Yes Starting Position (o'clock): 5 Ending Position (o'clock): 10 Maximum Distance: (cm) 0.5 Wound Description Classification: Grade 2 Wound Margin: Flat and Intact Exudate Amount: Medium Exudate Type: Serosanguineous Exudate Color: red, brown Foul Odor After Cleansing: No Slough/Fibrino Yes Wound Bed Granulation Amount: Large (67-100%) Exposed Structure Granulation Quality: Pink Fascia Exposed: No Necrotic Amount: Small (1-33%) Fat Layer (Subcutaneous Tissue) Exposed: Yes Necrotic Quality: Adherent Slough Tendon Exposed: No Muscle Exposed: No Joint Exposed: No Bone Exposed: No Electronic Signature(s) Signed: 06/18/2019 5:15:52 PM By: Gretta Cool, BSN, RN, CWS, Kim RN, BSN Entered By: Gretta Cool, BSN, RN, CWS, Kim on 06/18/2019 11:12:21 MOSTYN, VARNELL ([id number], LESNIAK ([id number]) -------------------------------------------------------------------------------- [place] Details Patient Name: [name] Date of Service: 06/18/2019 11:00 AM Medical Record Number: [id number] Patient Account Number: [id number] Date of Birth/Sex: 01-31-1946 (73 y.o. M) Treating RN: Cornell Barman Primary Care Lucrezia Dehne: [name] Other Clinician: Referring Talina Pleitez: [name] Treating Lasasha Brophy/Extender: Joaquim Lai  III, HOYT Weeks in Treatment: 18 Vital Signs Time Taken: 11:05 Temperature (F): 98.3 Height (in): 71 Pulse (bpm): 103 Weight (lbs): 205 Respiratory Rate (breaths/min): 16 Body Mass Index (BMI): 28.6 Blood Pressure (mmHg): 168/88 Reference Range: 80 - 120 mg / dl Notes Patient states he did not take his blood pressure medication this morning. Electronic Signature(s) Signed: 06/18/2019 5:15:52 PM By: Gretta Cool, BSN, RN, CWS, Kim RN, BSN Entered By: Gretta Cool, BSN, RN, CWS, Kim on 06/18/2019 11:06:48

## 2019-06-19 NOTE — Progress Notes (Signed)
BAPTISTE, LITTLER ([id number]) Visit Report for 06/18/2019 Chief Complaint Document Details Patient Name: GENE, GLAZEBROOK Date of Service: 06/18/2019 11:00 AM Medical Record Number: [id number] Patient Account Number: [id number] Date of Birth/Sex: [birth year] (74 y.o. M) Treating RN: Primary Care Provider: [name] Other Clinician: Referring Provider: [name] Treating Provider/Extender: Melburn Hake, Almas Rake Weeks in Treatment: 18 Information Obtained from: Patient Chief Complaint Left dorsal foot ulcer 02/09/2019; patient is here for review of the wound on the left lateral foot roughly at the level of the base of the fifth metatarsal Electronic Signature(s) Signed: 06/18/2019 2:42:46 PM By: Worthy Keeler [name] Entered By: Worthy Keeler on 06/18/2019 14:42:46 [name] ([id number]) -------------------------------------------------------------------------------- Debridement Details Patient Name: [name] Date of Service: 06/18/2019 11:00 AM Medical Record Number: [id number] Patient Account Number: [id number] Date of Birth/Sex: 1945/08/11 (74 y.o. M) Treating RN: Army Melia Primary Care Provider: [name] Other Clinician: Referring Provider: [name] Treating Provider/Extender: Melburn Hake, Ilianna Bown Weeks in Treatment: 18 Debridement Performed for Wound #5 Left,Lateral Foot Assessment: Performed By: Physician [name], Burman Bruington E., [name] Debridement Type: Debridement Severity of Tissue Pre Debridement: Fat layer exposed Level of Consciousness (Pre- Awake and Alert procedure): Pre-procedure Verification/Time Out Yes - 11:30 Taken: Start Time: 11:31 Pain Control: Lidocaine Total Area Debrided (L x W): 0.4 (cm) x 0.5 (cm) = 0.2 (cm) Tissue and other material debrided: Viable, Non-Viable, Callus, Slough, Subcutaneous, Slough Level: Skin/Subcutaneous Tissue Debridement Description: Excisional Instrument: Curette Bleeding: Minimum Hemostasis  Achieved: Pressure End Time: 11:32 Response to Treatment: Procedure was tolerated well Level of Consciousness (Post- Awake and Alert procedure): Post Debridement Measurements of Total Wound Length: (cm) 0.4 Width: (cm) 0.5 Depth: (cm) 0.1 Volume: (cm) 0.016 Character of Wound/Ulcer Post Debridement: Stable Severity of Tissue Post Debridement: Fat layer exposed Post Procedure Diagnosis Same as Pre-procedure Electronic Signature(s) Signed: 06/18/2019 11:35:54 AM By: Army Melia Signed: 06/19/2019 12:55:39 AM By: Worthy Keeler [name] Entered By: Army Melia on 06/18/2019 11:35:53 [name] ([id number]) -------------------------------------------------------------------------------- HPI Details Patient Name: [name] Date of Service: 06/18/2019 11:00 AM Medical Record Number: [id number] Patient Account Number: [id number] Date of Birth/Sex: [birth year] (74 y.o. M) Treating RN: Primary Care Provider: [name] Other Clinician: Referring Provider: [name] Treating Provider/Extender: Melburn Hake, Chara Marquard Weeks in Treatment: 18 History of Present Illness HPI Description: 10/04/16 on evaluation today patient presents for initial evaluation concerning a laceration of the right wrist and hand on the bowler aspect. He tells me that he fell into glass following a medication change in regard to his diabetes. He initially went to [facility] where she was sutured and subsequently his daughter who is a Marine scientist removed the teachers at the appropriate time. Unfortunately the wound has done poorly and dehissed which has caused him some trouble including infection. He has been on antibiotics both topically and orally though the wound continues to show signs of necrotic tissue according to notes which is what he was sent to [place] for evaluation concerning. He does have diabetes and unfortunately this is severely uncontrolled. He does see an endocrinologist and is working on  this. 10/11/2016 -- the patient's notes from the ER were reviewed and he had his suturing done on 09/17/2016 and had necrotic skin flaps which had to be trimmed the last visit he was here. He has managed to get his Santyl ointment and has been using this appropriately. 10/21/16 patient's wound appears to be doing very well on evaluation today. He still has some [employer]  covering the wound bed but this is nowhere near as severe as when i saw this initially two weeks ago nor even my review of his pictures last week. I'm pleased with how this is progressing. Patient's wound over the right form appears to be doing fairly well on evaluation today. There is no evidence of infection and he has a small area at the crease of where she is wrist extends and flexes that is still open and appears to be having difficulty due to the fact that it is cracking open. I think this is something that may be controlled with moisture control that is keeping the wound a little bit more moist. Nonetheless this is a tiny region compared to the initial wound and overall appears to be doing very well. No fevers, chills, nausea, or vomiting noted at this time. 11/25/16 on evaluation today patient appears to be doing well in regard to his right wrist ulcer. In fact this appears to be completely healed he is having no discomfort. He is pleased with how this is progressed. Readmission: 06/24/17 on evaluation today patient appears back in our clinic regarding issues that he has been having with his right ankle on the lateral malleolus as well is in the dorsal surface of his left foot. Both have been going on for several weeks unfortunately. He tells me that initially this was being managed by Dr. Edilia Bo and I did review the notes from Dr. Edilia Bo in epic. Patient was placed on amoxicillin initially and then subsequently Keflex following. He has been using triple antibiotic ointment over-the-counter along with a Band-Aid for the wound  currently. He did not have any Santyl remaining. His most recent hemoglobin A1c which was just two days ago was 10.3. Fortunately he seems to have excellent blood flow in the bilateral lower extremities with his left ABI being 0.95 in the right ABI 1.01. Patient does have some pain in regard to each of these ulcerations but he tells me it's not nearly as severe as the hand wound that I help treat earlier over the summer. This was in 2018. 07/15/17 on evaluation today patient's lower surety ulcer is actually both appear to be doing fairly well at this point. He does have a little bit of skin overlapping the ulcer on the right lateral ankle this was debrided away today just with saline and gauze without complication patient had no discomfort or pain. Otherwise patient's left foot ulcer does appear to be showing signs of improvement he does seem to have a right great toe. Nikki and noted at this point in time. I think that this does need to be managed at this point. No fevers, chills, nausea, or vomiting noted at this time. 07/22/17 on evaluation today patient's wounds in regard to his lower extremities appear to be doing somewhat better. His right ankle wound in fact appears to likely be completely closed. The left dorsal foot ulcer though still open does appear to be a little bit smaller and does also seem to be making good progress. He has not been having problems with the treatment at this point in the general he does not seem to show as much erythema surrounding the wound bed which is good news. He is not having significant discomfort. 08/12/17 on evaluation today patients wounds of both appeared to be doing better in fact they both appear to be completely healed. Overall I'm very pleased with how things have progressed over the past week even more than what I expected.  Obviously this is good news and he is happy about this. He did have a fall when going to his orthopedic doctor recently fortunately he  does not appear to have injured anything severely but nonetheless does have a couple of bumps and bruises nothing significant at this time but he needs to be seen and wound care for. He may have broken his left wrist he is following up with orthopedics in that regard. Readmission: 10/24/17 on evaluation today patient presents for reevaluation after having been discharged Aug 12, 2017. His wound was healed at that point although he states that it has been somewhat discolored since that time. He has not experienced the drainage as far as the wound is concerned but the fact that it was still discolored been greater than two months after the fact made him want to come in for reevaluation. Upon inspection today the patient has no pain although he does have neuropathy. He does have a discolored region of the dorsal surface of the left foot which was the site where the ulcer was that we previously treated. The good news is he does not again have any discomfort although obviously I do believe this may be some scar tissue and just discoloration in that regard. Again he hasn't had any drainage or discharge since I last saw him and at this point I see no evidence of that either this area shows complete epithelialization. In general I do not feel like there's any significant issue at this point in that regard. PSALM, SCHAPPELL ([id number]) 02/09/2019 This is a 74 year old man with type 2 diabetes poorly controlled with significant peripheral neuropathy. He has been in this clinic before in 2018 with a laceration on his right hand and then again in 2019 with an area over the right lateral malleolus. 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 He states that the wound is been there for the last week or 2. It is been uncomfortable to walk on. The man is an active man having to look after an elderly mother. Past medical  history, type 2 diabetes with polyneuropathy with a recent hemoglobin A1c in September 2011 0.3, lumbar disc disease, hypergonadism., Left foot drop and gout. We did not repeat his ABIs in the clinic quoting values from July 2019 of 0.95 and 1.01. He is not felt to have significant PAD 02/19/2019 on evaluation today patient actually appears to be doing better with regard to his foot wound based on what I am seeing currently. He was seen by Dr. Dellia Nims on readmission back into the clinic last week when I was off. The good news is the patient has been tolerating the dressing changes without any complication in fact has not really had much in the way of drainage. He does have an AFO brace which she believes is actually pushing on this area as well and has contributed to the issue. Fortunately there is no signs of active infection at this point. His ABIs appear to be well. 03/05/2019 on evaluation today patient appears to be doing well in regard to his foot ulcer. He has been tolerating the dressing changes without complication. Fortunately there is no signs of active infection. The wound is measuring somewhat smaller and overall I feel like he is making good progress at this point. 03/12/2019 on evaluation today patient actually appears to be doing quite well with regard to his foot  ulcer. The wound is looking better the measurement is not significantly better at this point unfortunately but nonetheless again the overall appearance is improving I think he is making some progress. I still think the big issue here is foot drop and again the patient wants to see if potentially he could get a brace to help with this. Subsequently I think that we can definitely make a referral to [facility] to see if they can help [place] in this regard. He is in agreement with that plan. 03/26/2019 on evaluation today patient appears to be doing more poorly in regard to his foot ulcer at this time. He did see podiatry in order to  see about getting a brace but unfortunately he tells me that Medicare is not likely to pay for one for him as he had 1 I guess 8 months ago. However this one that apparently goes in his shoes that does not seem to be working out well for him. Unfortunately there are signs of active infection at this time. No fevers, chills, nausea, vomiting, or diarrhea. Systemically. The patient has erythema on the dorsal surface of his foot that tracks from the lateral portion of his foot and again he does have a blister around the wound as well which is worse compared to previous. Overall I am concerned. 04/02/2019 on evaluation today patient appears to be doing about the same with regard to his wound. Unfortunately there is no signs of significant improvement although I do think there is no signs of infection which is good news. His drainage is also slowed down tremendously. I think at this point he would benefit from the initiation of a total contact cast. The only concern I have is that over his balance. I explained to the patient that if we were to initiate the cast I think it would be greatly beneficial for him but he would need to ensure not to walk at all unless he uses a walker he actually has 2 walkers one for inside his home and one that he keeps out in the car. For that reason he would be covered in that regard. I just do not want him to have any accidents and fall with the cast. 12/28-Patient comes in 1 week out a day early for his appointment he was in the hospital for 2 days for uncontrolled hyperglycemia and required management of high blood sugars, with some adjustments in his home regimen of insulin, patient stubbed his right great toe today and put a Band-Aid on it, he is here for his left lateral foot ulcer and we are using silver alginate dressing, he was intact total contact cast with difficulty with balance to the extent that that had to be discontinued 04/16/2019 on evaluation today patient  appears to be doing better with regard to the overall appearance of his wound size. This is good news. We had attempted a total contact cast but unfortunately he had some falling issues that the family contacted [place] about we took the cast off we did not put it back on. Nonetheless it ended up that just a couple days later he was in the hospital due to elevations in his blood sugar which went to around 500 and even a little higher. Subsequently he was admitted to the hospital and was in the hospital for a total of 3 days and that included Christmas day. Fortunately there got his blood sugar under control he tells me that this is running about 200 now which is  much better news. There does not appear to be any signs of active infection at this time. No fevers, chills, nausea, vomiting, or diarrhea. 04/30/2019 on evaluation today patient actually appears to be doing slightly worse compared to previous as far as the overall size of his wound is concerned. There also is some need for sharp debridement today as well. Fortunately there is no evidence of active infection which is good news. No fevers, chills, nausea, vomiting, or diarrhea. 05/07/2019 upon evaluation today patient appears to be doing excellent in regard to his plantar foot ulcer. He has been tolerating the dressing changes without complication. Fortunately there is no signs of active infection at this time. No fevers, chills, nausea, vomiting, or diarrhea. He does tell me that he actually got in touch with [employer] and they feel like they can add diagnosis codes to his orders in order to get an external brace for him as opposed to the one that goes internal to issue. That would be excellent for his foot drop and I did tell him that if there is anything I need to fill out in that regard I will be more than happy to oblige and fill out what is needed for [employer]. 05/14/2019 upon evaluation today patient's original wound site actually appears to be doing  much better which is good news. Fortunately there is no signs of active infection at this time. No fevers, chills, nausea, vomiting, or diarrhea. With that being said the patient unfortunately is continuing to have issues with rubbing he tells me that the felt slid on his foot although he did not realize it until today. Nonetheless I think this did cause a blister around the edge of the wound and he does an open wound that is slightly larger overall in measurement compared to last time mainly due to this blister. Other than that I feel like he is doing quite well. 05/21/2019 upon evaluation today patient appears to be doing excellent in regard to his wound at this time on the plantar foot. He is showing signs of improvement he does have some slough and biofilm noted on the surface of the wound this is going require some sharp debridement today as well. Fortunately there is no evidence of active infection. No fevers, chills, nausea, vomiting, or diarrhea. 05/28/2019 on evaluation today patient's wound actually appears to be measuring smaller upon initial inspection and I do believe that it is doing well. KASHAWN, DIRR ([id number]) With that being said he continues to have a blister on the outside/lateral edge of the wound that occurs as a result of rubbing due to his dropfoot currently. Subsequently we have filled out the paperwork for his new external brace but again that does take several weeks to get that completed once everything is returned to back we did send this back last week as far as the paperwork was concerned. With that being said there does not appear to be any signs of active infection at this time. 06/04/2019 on evaluation today patient's wound actually appears to be doing slightly better compared to last week which is good news. He still states that his insurance unfortunately does not seem to want to cover any of his new brace. He is going to just pay for this himself. Nonetheless he  is going by there today when he leaves here that is [employer] in [place] that is going to be making this brace for him. 06/18/2019 upon evaluation today patient fortunately does have his new brace which is  the external AFO brace with bilateral uprights. Subsequently he seems to be doing much better in fact the wound already appears better. He has not even been using the felt he is just using a dressing over top of the wound and overall I am very pleased with how things seem to be progressing. There is no signs of active infection at this time which is great news and I think that the AFO is good to help him tremendously as far as trying to keep pressure off of the Electronic Signature(s) Signed: 06/18/2019 6:23:46 PM By: Worthy Keeler [name] Entered By: Worthy Keeler on 06/18/2019 18:23:45 [name] ([id number]) -------------------------------------------------------------------------------- Physical Exam Details Patient Name: [name] Date of Service: 06/18/2019 11:00 AM Medical Record Number: [id number] Patient Account Number: [id number] Date of Birth/Sex: October 07, 1945 (74 y.o. M) Treating RN: Primary Care Provider: [name] Other Clinician: Referring Provider: [name] Treating Provider/Extender: [name], Marvin Grabill Weeks in Treatment: 38 Constitutional Well-nourished and well-hydrated in no acute distress. Respiratory normal breathing without difficulty. Psychiatric this patient is able to make decisions and demonstrates good insight into disease process. Alert and Oriented x 3. pleasant and cooperative. Notes Patient's wound bed currently showed signs of excellent granulation at this time. Fortunately there is no evidence of active infection which is also good news. In general I did have to perform some sharp debridement clear away necrotic material from the surface of the wound he tolerated that today without complication post debridement wound bed appears to be  doing much better. Electronic Signature(s) Signed: 06/18/2019 6:24:05 PM By: Worthy Keeler [name] Entered By: Worthy Keeler on 06/18/2019 18:24:05 [name] ([id number]) -------------------------------------------------------------------------------- Physician Orders Details Patient Name: [name] Date of Service: 06/18/2019 11:00 AM Medical Record Number: [id number] Patient Account Number: [id number] Date of Birth/Sex: July 08, 1945 (74 y.o. M) Treating RN: Primary Care Provider: [name] Other Clinician: Referring Provider: [name] Treating Provider/Extender: Melburn Hake, Korbin Notaro Weeks in Treatment: 72 Verbal / Phone Orders: No Diagnosis Coding ICD-10 Coding Code Description E11.621 Type 2 diabetes mellitus with foot ulcer E11.42 Type 2 diabetes mellitus with diabetic polyneuropathy L97.522 Non-pressure chronic ulcer of other part of left foot with fat layer exposed M21.372 Foot drop, left foot Wound Cleansing Wound #5 Left,Lateral Foot o May shower with protection. Anesthetic (add to Medication List) Wound #5 Left,Lateral Foot o Topical Lidocaine 4% cream applied to wound bed prior to debridement (In Clinic Only). Primary Wound Dressing Wound #5 Left,Lateral Foot o Silver Collagen Secondary Dressing Wound #5 [place] Dressing Change Frequency Wound #5 Left,Lateral Foot o Change dressing every other day. Follow-up Appointments Wound #5 Left,Lateral Foot o Return Appointment in 1 week. Edema Control Wound #5 Left,Lateral Foot o Elevate legs to the level of the heart and pump ankles as often as possible Additional Orders / Instructions Wound #5 Left,Lateral Foot o Other: - Patient to follow-up with [employer] for adjustments to braces and shoes. Requested paperwork sent to [employer]. Electronic Signature(s) Signed: 06/18/2019 6:25:10 PM By: Worthy Keeler [name] Entered By: Worthy Keeler on 06/18/2019  18:25:09 [name] ([id number]) -------------------------------------------------------------------------------- Problem List Details Patient Name: [name] Date of Service: 06/18/2019 11:00 AM Medical Record Number: [id number] Patient Account Number: [id number] Date of Birth/Sex: Oct 11, 1945 (74 y.o. M) Treating RN: Primary Care Provider: [name] Other Clinician: Referring Provider: [name] Treating Provider/Extender: Melburn Hake, Broughton Eppinger Weeks in Treatment: 18 Active Problems ICD-10 Evaluated Encounter Code Description Active Date Today Diagnosis  Q94.765 Type 2 diabetes mellitus with foot ulcer 02/09/2019 No Yes E11.42 Type 2 diabetes mellitus with diabetic polyneuropathy 02/09/2019 No Yes L97.522 Non-pressure chronic ulcer of other part of left foot with fat layer exposed 02/09/2019 No Yes M21.372 Foot drop, left foot 02/09/2019 No Yes Inactive Problems Resolved Problems Electronic Signature(s) Signed: 06/18/2019 2:42:40 PM By: Worthy Keeler [name] Entered By: Worthy Keeler on 06/18/2019 14:42:40 [name] ([id number]) -------------------------------------------------------------------------------- Progress Note Details Patient Name: [name] Date of Service: 06/18/2019 11:00 AM Medical Record Number: [id number] Patient Account Number: [id number] Date of Birth/Sex: [birth year] (74 y.o. M) Treating RN: Primary Care Provider: [name] Other Clinician: Referring Provider: [name] Treating Provider/Extender: Melburn Hake, Laneka Mcgrory Weeks in Treatment: 18 Subjective Chief Complaint Information obtained from Patient Left dorsal foot ulcer 02/09/2019; patient is here for review of the wound on the left lateral foot roughly at the level of the base of the fifth metatarsal History of Present Illness (HPI) 10/04/16 on evaluation today patient presents for initial evaluation concerning a laceration of the right wrist and hand on the bowler  aspect. He tells me that he fell into glass following a medication change in regard to his diabetes. He initially went to [facility] where she was sutured and subsequently his daughter who is a Marine scientist removed the teachers at the appropriate time. Unfortunately the wound has done poorly and dehissed which has caused him some trouble including infection. He has been on antibiotics both topically and orally though the wound continues to show signs of necrotic tissue according to notes which is what he was sent to [place] for evaluation concerning. He does have diabetes and unfortunately this is severely uncontrolled. He does see an endocrinologist and is working on this. 10/11/2016 -- the patient's notes from the ER were reviewed and he had his suturing done on 09/17/2016 and had necrotic skin flaps which had to be trimmed the last visit he was here. He has managed to get his Santyl ointment and has been using this appropriately. 10/21/16 patient's wound appears to be doing very well on evaluation today. He still has some Slough covering the wound bed but this is nowhere near as severe as when i saw this initially two weeks ago nor even my review of his pictures last week. I'm pleased with how this is progressing. Patient's wound over the right form appears to be doing fairly well on evaluation today. There is no evidence of infection and he has a small area at the crease of where she is wrist extends and flexes that is still open and appears to be having difficulty due to the fact that it is cracking open. I think this is something that may be controlled with moisture control that is keeping the wound a little bit more moist. Nonetheless this is a tiny region compared to the initial wound and overall appears to be doing very well. No fevers, chills, nausea, or vomiting noted at this time. 11/25/16 on evaluation today patient appears to be doing well in regard to his right wrist ulcer. In fact this appears to  be completely healed he is having no discomfort. He is pleased with how this is progressed. Readmission: 06/24/17 on evaluation today patient appears back in our clinic regarding issues that he has been having with his right ankle on the lateral malleolus as well is in the dorsal surface of his left foot. Both have been going on for several weeks unfortunately. He tells me that  initially this was being managed by Dr. Edilia Bo and I did review the notes from Dr. Edilia Bo in epic. Patient was placed on amoxicillin initially and then subsequently Keflex following. He has been using triple antibiotic ointment over-the-counter along with a Band-Aid for the wound currently. He did not have any Santyl remaining. His most recent hemoglobin A1c which was just two days ago was 10.3. Fortunately he seems to have excellent blood flow in the bilateral lower extremities with his left ABI being 0.95 in the right ABI 1.01. Patient does have some pain in regard to each of these ulcerations but he tells me it's not nearly as severe as the hand wound that I help treat earlier over the summer. This was in 2018. 07/15/17 on evaluation today patient's lower surety ulcer is actually both appear to be doing fairly well at this point. He does have a little bit of skin overlapping the ulcer on the right lateral ankle this was debrided away today just with saline and gauze without complication patient had no discomfort or pain. Otherwise patient's left foot ulcer does appear to be showing signs of improvement he does seem to have a right great toe. Nikki and noted at this point in time. I think that this does need to be managed at this point. No fevers, chills, nausea, or vomiting noted at this time. 07/22/17 on evaluation today patient's wounds in regard to his lower extremities appear to be doing somewhat better. His right ankle wound in fact appears to likely be completely closed. The left dorsal foot ulcer though still open  does appear to be a little bit smaller and does also seem to be making good progress. He has not been having problems with the treatment at this point in the general he does not seem to show as much erythema surrounding the wound bed which is good news. He is not having significant discomfort. 08/12/17 on evaluation today patients wounds of both appeared to be doing better in fact they both appear to be completely healed. Overall I'm very pleased with how things have progressed over the past week even more than what I expected. Obviously this is good news and he is happy about this. He did have a fall when going to his orthopedic doctor recently fortunately he does not appear to have injured anything severely but nonetheless does have a couple of bumps and bruises nothing significant at this time but he needs to be seen and wound care for. He may have broken his left wrist he is following up with orthopedics in that regard. Readmission: 10/24/17 on evaluation today patient presents for reevaluation after having been discharged Aug 12, 2017. His wound was healed at that point although he states that it has been somewhat discolored since that time. He has not experienced the drainage as far as the wound is concerned but the fact that it was still discolored been greater than two months after the fact made him want to come in for reevaluation. Upon inspection today the patient has no pain although he does have neuropathy. He does have a discolored region of the dorsal surface of the left foot which was the site where the ulcer was NINO, AMANO. ([id number]) that we previously treated. The good news is he does not again have any discomfort although obviously I do believe this may be some scar tissue and just discoloration in that regard. Again he hasn't had any drainage or discharge since I last saw him and at  this point I see no evidence of that either this area shows complete epithelialization. In  general I do not feel like there's any significant issue at this point in that regard. 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 He has been in this clinic before in 2018 with a laceration on his right hand and then again in 2019 with an area over the right lateral malleolus. 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 He states that the wound is been there for the last week or 2. It is been uncomfortable to walk on. The man is an active man having to look after an elderly mother. Past medical history, type 2 diabetes with polyneuropathy with a recent hemoglobin A1c in September 2011 0.3, lumbar disc disease, hypergonadism., Left foot drop and gout. We did not repeat his ABIs in the clinic quoting values from July 2019 of 0.95 and 1.01. He is not felt to have significant PAD 02/19/2019 on evaluation today patient actually appears to be doing better with regard to his foot wound based on what I am seeing currently. He was seen by Dr. Dellia Nims on readmission back into the clinic last week when I was off. The good news is the patient has been tolerating the dressing changes without any complication in fact has not really had much in the way of drainage. He does have an AFO brace which she believes is actually pushing on this area as well and has contributed to the issue. Fortunately there is no signs of active infection at this point. His ABIs appear to be well. 03/05/2019 on evaluation today patient appears to be doing well in regard to his foot ulcer. He has been tolerating the dressing changes without complication. Fortunately there is no signs of active infection. The wound is measuring somewhat smaller and overall I feel like he is making good progress at this point. 03/12/2019 on evaluation today patient actually appears to be doing  quite well with regard to his foot ulcer. The wound is looking better the measurement is not significantly better at this point unfortunately but nonetheless again the overall appearance is improving I think he is making some progress. I still think the big issue here is foot drop and again the patient wants to see if potentially he could get a brace to help with this. Subsequently I think that we can definitely make a referral to [facility] to see if they can help [place] in this regard. He is in agreement with that plan. 03/26/2019 on evaluation today patient appears to be doing more poorly in regard to his foot ulcer at this time. He did see podiatry in order to see about getting a brace but unfortunately he tells me that Medicare is not likely to pay for one for him as he had 1 I guess 8 months ago. However this one that apparently goes in his shoes that does not seem to be working out well for him. Unfortunately there are signs of active infection at this time. No fevers, chills, nausea, vomiting, or diarrhea. Systemically. The patient has erythema on the dorsal surface of his foot that tracks from the lateral portion of his foot and again he does have a blister around the wound as well which is worse compared to previous. Overall I am concerned. 04/02/2019 on evaluation today patient appears  to be doing about the same with regard to his wound. Unfortunately there is no signs of significant improvement although I do think there is no signs of infection which is good news. His drainage is also slowed down tremendously. I think at this point he would benefit from the initiation of a total contact cast. The only concern I have is that over his balance. I explained to the patient that if we were to initiate the cast I think it would be greatly beneficial for him but he would need to ensure not to walk at all unless he uses a walker he actually has 2 walkers one for inside his home and one that he keeps out  in the car. For that reason he would be covered in that regard. I just do not want him to have any accidents and fall with the cast. 12/28-Patient comes in 1 week out a day early for his appointment he was in the hospital for 2 days for uncontrolled hyperglycemia and required management of high blood sugars, with some adjustments in his home regimen of insulin, patient stubbed his right great toe today and put a Band-Aid on it, he is here for his left lateral foot ulcer and we are using silver alginate dressing, he was intact total contact cast with difficulty with balance to the extent that that had to be discontinued 04/16/2019 on evaluation today patient appears to be doing better with regard to the overall appearance of his wound size. This is good news. We had attempted a total contact cast but unfortunately he had some falling issues that the family contacted [place] about we took the cast off we did not put it back on. Nonetheless it ended up that just a couple days later he was in the hospital due to elevations in his blood sugar which went to around 500 and even a little higher. Subsequently he was admitted to the hospital and was in the hospital for a total of 3 days and that included Christmas day. Fortunately there got his blood sugar under control he tells me that this is running about 200 now which is much better news. There does not appear to be any signs of active infection at this time. No fevers, chills, nausea, vomiting, or diarrhea. 04/30/2019 on evaluation today patient actually appears to be doing slightly worse compared to previous as far as the overall size of his wound is concerned. There also is some need for sharp debridement today as well. Fortunately there is no evidence of active infection which is good news. No fevers, chills, nausea, vomiting, or diarrhea. 05/07/2019 upon evaluation today patient appears to be doing excellent in regard to his plantar foot ulcer. He has been  tolerating the dressing changes without complication. Fortunately there is no signs of active infection at this time. No fevers, chills, nausea, vomiting, or diarrhea. He does tell me that he actually got in touch with [employer] and they feel like they can add diagnosis codes to his orders in order to get an external brace for him as opposed to the one that goes internal to issue. That would be excellent for his foot drop and I did tell him that if there is anything I need to fill out in that regard I will be more than happy to oblige and fill out what is needed for [employer]. 05/14/2019 upon evaluation today patient's original wound site actually appears to be doing much better which is good news. Fortunately there is no  signs of active infection at this time. No fevers, chills, nausea, vomiting, or diarrhea. With that being said the patient unfortunately is continuing to have issues with rubbing he tells me that the felt slid on his foot although he did not realize it until today. Nonetheless I think this did cause a blister around the edge of the wound and he does an open wound that is slightly larger overall in measurement compared to last time mainly due to this blister. Other than that I feel like he is doing quite well. MCARTHUR, IVINS ([id number]) 05/21/2019 upon evaluation today patient appears to be doing excellent in regard to his wound at this time on the plantar foot. He is showing signs of improvement he does have some slough and biofilm noted on the surface of the wound this is going require some sharp debridement today as well. Fortunately there is no evidence of active infection. No fevers, chills, nausea, vomiting, or diarrhea. 05/28/2019 on evaluation today patient's wound actually appears to be measuring smaller upon initial inspection and I do believe that it is doing well. With that being said he continues to have a blister on the outside/lateral edge of the wound that occurs as a result  of rubbing due to his dropfoot currently. Subsequently we have filled out the paperwork for his new external brace but again that does take several weeks to get that completed once everything is returned to back we did send this back last week as far as the paperwork was concerned. With that being said there does not appear to be any signs of active infection at this time. 06/04/2019 on evaluation today patient's wound actually appears to be doing slightly better compared to last week which is good news. He still states that his insurance unfortunately does not seem to want to cover any of his new brace. He is going to just pay for this himself. Nonetheless he is going by there today when he leaves here that is [employer] in [place] that is going to be making this brace for him. 06/18/2019 upon evaluation today patient fortunately does have his new brace which is the external AFO brace with bilateral uprights. Subsequently he seems to be doing much better in fact the wound already appears better. He has not even been using the felt he is just using a dressing over top of the wound and overall I am very pleased with how things seem to be progressing. There is no signs of active infection at this time which is great news and I think that the AFO is good to help him tremendously as far as trying to keep pressure off of the Objective Constitutional Well-nourished and well-hydrated in no acute distress. Vitals Time Taken: 11:05 AM, Height: 71 in, Weight: 205 lbs, BMI: 28.6, Temperature: 98.3 F, Pulse: 103 bpm, Respiratory Rate: 16 breaths/min, Blood Pressure: 168/88 mmHg. General Notes: Patient states he did not take his blood pressure medication this morning. Respiratory normal breathing without difficulty. Psychiatric this patient is able to make decisions and demonstrates good insight into disease process. Alert and Oriented x 3. pleasant and cooperative. General Notes: Patient's wound bed  currently showed signs of excellent granulation at this time. Fortunately there is no evidence of active infection which is also good news. In general I did have to perform some sharp debridement clear away necrotic material from the surface of the wound he tolerated that today without complication post debridement wound bed appears to be doing much better. Integumentary (  Hair, Skin) Wound #5 status is Open. Original cause of wound was Pressure Injury. The wound is located on the Left,Lateral Foot. The wound measures 0.4cm length x 0.5cm width x 0.1cm depth; 0.157cm^2 area and 0.016cm^3 volume. There is Fat Layer (Subcutaneous Tissue) Exposed exposed. There is no tunneling noted, however, there is undermining starting at 5:00 and ending at 10:00 with a maximum distance of 0.5cm. There is a medium amount of serosanguineous drainage noted. The wound margin is flat and intact. There is large (67-100%) pink granulation within the wound bed. There is a small (1-33%) amount of necrotic tissue within the wound bed including Adherent Slough. 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 ([id number]) Procedures Wound #5 Pre-procedure diagnosis of Wound #5 is a Diabetic Wound/Ulcer of the Lower Extremity located on the Left,Lateral Foot .Severity of Tissue Pre Debridement is: Fat layer exposed. There was a Excisional Skin/Subcutaneous Tissue Debridement with a total area of 0.2 sq cm performed by [name], [place] Spees E., [name]. With the following instrument(s): Curette to remove Viable and Non-Viable tissue/material. Material removed includes Callus, Subcutaneous Tissue, and Slough after achieving pain control using Lidocaine. A time out was conducted at 11:30, prior to the start of the procedure. A Minimum amount  of bleeding was controlled with Pressure. The procedure was tolerated well. Post Debridement Measurements: 0.4cm length x 0.5cm width x 0.1cm depth; 0.016cm^3 volume. Character of Wound/Ulcer Post Debridement is stable. Severity of Tissue Post Debridement is: Fat layer exposed. Post procedure Diagnosis Wound #5: Same as Pre-Procedure Plan Wound Cleansing: Wound #5 Left,Lateral Foot: May shower with protection. Anesthetic (add to Medication List): Wound #5 Left,Lateral Foot: Topical Lidocaine 4% cream applied to wound bed prior to debridement (In Clinic Only). Primary Wound Dressing: Wound #5 Left,Lateral Foot: Silver Collagen Secondary Dressing: Wound #5 Left,Lateral Foot: [place] Dressing Change Frequency: Wound #5 Left,Lateral Foot: Change dressing every other day. Follow-up Appointments: Wound #5 Left,Lateral Foot: Return Appointment in 1 week. Edema Control: Wound #5 Left,Lateral Foot: Elevate legs to the level of the heart and pump ankles as often as possible Additional Orders / Instructions: Wound #5 Left,Lateral Foot: Other: - Patient to follow-up with [employer] for adjustments to braces and shoes. Requested paperwork sent to [employer]. 1. My suggestion at this time is good to be that we go ahead and continue with the previous wound care measures which includes a silver collagen dressing I think that still the best way to go. 2. I am also can recommend that the patient use no more felt as far as offloading is concerned I do not think that is necessary at this point. He just can use the [employer] dressing. 3. I do recommend he continue to wear his AFO brace regularly obviously I think this is can help him not only walk better but also heal more effectively and quickly. We will see patient back for reevaluation in 1 week here in the clinic. If anything worsens or changes patient will contact our office for additional recommendations. Electronic Signature(s) Signed:  06/18/2019 6:25:23 PM By: Worthy Keeler [name] Entered By: Worthy Keeler on 06/18/2019 18:25:22 [name] ([id number]) -------------------------------------------------------------------------------- SuperBill Details Patient Name: [name] Date of Service: 06/18/2019 Medical Record Number: [id number] Patient Account Number: [id number] Date of Birth/Sex: [birth year] (74 y.o. M) Treating RN: Primary Care  Provider: [name] Other Clinician: Referring Provider: [name] Treating Provider/Extender: Melburn Hake, Oak[place] Esther Weeks in Treatment: 18 Diagnosis Coding ICD-10 Codes Code Description E11.621 Type 2 diabetes mellitus with foot ulcer E11.42 Type 2 diabetes mellitus with diabetic polyneuropathy L97.522 Non-pressure chronic ulcer of other part of left foot with fat layer exposed M21.372 Foot drop, left foot Facility Procedures CPT4 Code: 60737106 Description: 26948 - DEB SUBQ TISSUE 20 SQ CM/< Modifier: Quantity: 1 CPT4 Code: Description: ICD-10 Diagnosis Description L97.522 Non-pressure chronic ulcer of other part of left foot with fat layer exposed Modifier: Quantity: Physician Procedures CPT4 Code: 5462703 Description: 50093 - WC PHYS SUBQ TISS 20 SQ CM Modifier: Quantity: 1 CPT4 Code: Description: ICD-10 Diagnosis Description L97.522 Non-pressure chronic ulcer of other part of left foot with fat layer exposed Modifier: Quantity: Electronic Signature(s) Signed: 06/18/2019 6:25:35 PM By: Worthy Keeler [name] Entered By: Worthy Keeler on 06/18/2019 18:25:34

## 2019-06-20 ENCOUNTER — Ambulatory Visit: Payer: Medicare Other

## 2019-06-20 ENCOUNTER — Ambulatory Visit (INDEPENDENT_AMBULATORY_CARE_PROVIDER_SITE_OTHER): Payer: Medicare Other

## 2019-06-20 ENCOUNTER — Other Ambulatory Visit: Payer: Self-pay

## 2019-06-20 DIAGNOSIS — E291 Testicular hypofunction: Secondary | ICD-10-CM | POA: Diagnosis not present

## 2019-06-20 NOTE — Progress Notes (Signed)
Per orders of Dr. Lorelei Pont, injection of 150mg  testosterone in right upper outer quadrant, given by Randall An. Patient tolerated injection well.

## 2019-06-25 ENCOUNTER — Other Ambulatory Visit: Payer: Self-pay

## 2019-06-25 ENCOUNTER — Encounter: Payer: Medicare Other | Admitting: Physician Assistant

## 2019-06-25 DIAGNOSIS — E1142 Type 2 diabetes mellitus with diabetic polyneuropathy: Secondary | ICD-10-CM | POA: Diagnosis not present

## 2019-06-25 DIAGNOSIS — M109 Gout, unspecified: Secondary | ICD-10-CM | POA: Diagnosis not present

## 2019-06-25 DIAGNOSIS — G4730 Sleep apnea, unspecified: Secondary | ICD-10-CM | POA: Diagnosis not present

## 2019-06-25 DIAGNOSIS — M1990 Unspecified osteoarthritis, unspecified site: Secondary | ICD-10-CM | POA: Diagnosis not present

## 2019-06-25 DIAGNOSIS — M21372 Foot drop, left foot: Secondary | ICD-10-CM | POA: Diagnosis not present

## 2019-06-25 DIAGNOSIS — Z794 Long term (current) use of insulin: Secondary | ICD-10-CM | POA: Diagnosis not present

## 2019-06-25 DIAGNOSIS — E11621 Type 2 diabetes mellitus with foot ulcer: Secondary | ICD-10-CM | POA: Diagnosis not present

## 2019-06-25 DIAGNOSIS — L97522 Non-pressure chronic ulcer of other part of left foot with fat layer exposed: Secondary | ICD-10-CM | POA: Diagnosis not present

## 2019-06-25 DIAGNOSIS — I10 Essential (primary) hypertension: Secondary | ICD-10-CM | POA: Diagnosis not present

## 2019-06-25 NOTE — Progress Notes (Addendum)
TRESTEN, PANTOJA ([id number]) Visit Report for 06/25/2019 Chief Complaint Document Details Patient Name: [name], [name] Date of Service: 06/25/2019 11:00 AM Medical Record Number: [id number] Patient Account Number: [id number] Date of Birth/Sex: 02-27-1946 (74 y.o. M) Treating RN: Primary Care Provider: [name] Other Clinician: Referring Provider: [name] Treating Provider/Extender: Melburn Hake, Japhet Morgenthaler Weeks in Treatment: 19 Information Obtained from: Patient Chief Complaint Left dorsal foot ulcer 02/09/2019; patient is here for review of the wound on the left lateral foot roughly at the level of the base of the fifth metatarsal Electronic Signature(s) Signed: 06/25/2019 11:03:01 AM By: Worthy Keeler [name] Entered By: Worthy Keeler on 06/25/2019 11:03:00 [name] ([id number]) -------------------------------------------------------------------------------- Debridement Details Patient Name: [name] Date of Service: 06/25/2019 11:00 AM Medical Record Number: [id number] Patient Account Number: [id number] Date of Birth/Sex: [birth year] (74 y.o. M) Treating RN: Army Melia Primary Care Provider: [name] Other Clinician: Referring Provider: [name] Treating Provider/Extender: Melburn Hake, Aleina Burgio Weeks in Treatment: 19 Debridement Performed for Wound #5 Left,Lateral Foot Assessment: Performed By: Physician [name], Karston Hyland E., [name] Debridement Type: Debridement Severity of Tissue Pre Debridement: Fat layer exposed Level of Consciousness (Pre- Awake and Alert procedure): Pre-procedure Verification/Time Out Yes - 11:35 Taken: Start Time: 11:36 Pain Control: Lidocaine Total Area Debrided (L x W): 0.1 (cm) x 0.2 (cm) = 0.02 (cm) Tissue and other material debrided: Viable, Non-Viable, Callus, Subcutaneous Level: Skin/Subcutaneous Tissue Debridement Description: Excisional Instrument: Curette Bleeding: None End Time: 11:37 Response to  Treatment: Procedure was tolerated well Level of Consciousness (Post- Awake and Alert procedure): Post Debridement Measurements of Total Wound Length: (cm) 0.1 Width: (cm) 0.2 Depth: (cm) 0.1 Volume: (cm) 0.002 Character of Wound/Ulcer Post Debridement: Stable Severity of Tissue Post Debridement: Fat layer exposed Post Procedure Diagnosis Same as Pre-procedure Electronic Signature(s) Signed: 06/25/2019 1:35:00 PM By: Worthy Keeler [name] Signed: 06/26/2019 10:13:19 AM By: Army Melia Entered By: Army Melia on 06/25/2019 11:37:49 [name] ([id number]) -------------------------------------------------------------------------------- HPI Details Patient Name: [name] Date of Service: 06/25/2019 11:00 AM Medical Record Number: [id number] Patient Account Number: [id number] Date of Birth/Sex: July 15, 1945 (74 y.o. M) Treating RN: Primary Care Provider: [name] Other Clinician: Referring Provider: [name] Treating Provider/Extender: Melburn Hake, Gemini Bunte Weeks in Treatment: 19 History of Present Illness HPI Description: 10/04/16 on evaluation today patient presents for initial evaluation concerning a laceration of the right wrist and hand on the bowler aspect. He tells me that he fell into glass following a medication change in regard to his diabetes. He initially went to [facility] where she was sutured and subsequently his daughter who is a Marine scientist removed the teachers at the appropriate time. Unfortunately the wound has done poorly and dehissed which has caused him some trouble including infection. He has been on antibiotics both topically and orally though the wound continues to show signs of necrotic tissue according to notes which is what he was sent to [place] for evaluation concerning. He does have diabetes and unfortunately this is severely uncontrolled. He does see an endocrinologist and is working on this. 10/11/2016 -- the patient's notes from the ER were  reviewed and he had his suturing done on 09/17/2016 and had necrotic skin flaps which had to be trimmed the last visit he was here. He has managed to get his Santyl ointment and has been using this appropriately. 10/21/16 patient's wound appears to be doing very well on evaluation today. He still has some Slough covering the wound bed but  this is nowhere near as severe as when i saw this initially two weeks ago nor even my review of his pictures last week. I'm pleased with how this is progressing. Patient's wound over the right form appears to be doing fairly well on evaluation today. There is no evidence of infection and he has a small area at the crease of where she is wrist extends and flexes that is still open and appears to be having difficulty due to the fact that it is cracking open. I think this is something that may be controlled with moisture control that is keeping the wound a little bit more moist. Nonetheless this is a tiny region compared to the initial wound and overall appears to be doing very well. No fevers, chills, nausea, or vomiting noted at this time. 11/25/16 on evaluation today patient appears to be doing well in regard to his right wrist ulcer. In fact this appears to be completely healed he is having no discomfort. He is pleased with how this is progressed. Readmission: 06/24/17 on evaluation today patient appears back in our clinic regarding issues that he has been having with his right ankle on the lateral malleolus as well is in the dorsal surface of his left foot. Both have been going on for several weeks unfortunately. He tells me that initially this was being managed by Dr. Edilia Bo and I did review the notes from Dr. Edilia Bo in epic. Patient was placed on amoxicillin initially and then subsequently Keflex following. He has been using triple antibiotic ointment over-the-counter along with a Band-Aid for the wound currently. He did not have any Santyl remaining. His most  recent hemoglobin A1c which was just two days ago was 10.3. Fortunately he seems to have excellent blood flow in the bilateral lower extremities with his left ABI being 0.95 in the right ABI 1.01. Patient does have some pain in regard to each of these ulcerations but he tells me it's not nearly as severe as the hand wound that I help treat earlier over the summer. This was in 2018. 07/15/17 on evaluation today patient's lower surety ulcer is actually both appear to be doing fairly well at this point. He does have a little bit of skin overlapping the ulcer on the right lateral ankle this was debrided away today just with saline and gauze without complication patient had no discomfort or pain. Otherwise patient's left foot ulcer does appear to be showing signs of improvement he does seem to have a right great toe. Nikki and noted at this point in time. I think that this does need to be managed at this point. No fevers, chills, nausea, or vomiting noted at this time. 07/22/17 on evaluation today patient's wounds in regard to his lower extremities appear to be doing somewhat better. His right ankle wound in fact appears to likely be completely closed. The left dorsal foot ulcer though still open does appear to be a little bit smaller and does also seem to be making good progress. He has not been having problems with the treatment at this point in the general he does not seem to show as much erythema surrounding the wound bed which is good news. He is not having significant discomfort. 08/12/17 on evaluation today patients wounds of both appeared to be doing better in fact they both appear to be completely healed. Overall I'm very pleased with how things have progressed over the past week even more than what I expected. Obviously this is good news  and he is happy about this. He did have a fall when going to his orthopedic doctor recently fortunately he does not appear to have injured anything severely but  nonetheless does have a couple of bumps and bruises nothing significant at this time but he needs to be seen and wound care for. He may have broken his left wrist he is following up with orthopedics in that regard. Readmission: 10/24/17 on evaluation today patient presents for reevaluation after having been discharged Aug 12, 2017. His wound was healed at that point although he states that it has been somewhat discolored since that time. He has not experienced the drainage as far as the wound is concerned but the fact that it was still discolored been greater than two months after the fact made him want to come in for reevaluation. Upon inspection today the patient has no pain although he does have neuropathy. He does have a discolored region of the dorsal surface of the left foot which was the site where the ulcer was that we previously treated. The good news is he does not again have any discomfort although obviously I do believe this may be some scar tissue and just discoloration in that regard. Again he hasn't had any drainage or discharge since I last saw him and at this point I see no evidence of that either this area shows complete epithelialization. In general I do not feel like there's any significant issue at this point in that regard. HORACIO, WERTH ([id number]) 02/09/2019 This is a 74 year old man with type 2 diabetes poorly controlled with significant peripheral neuropathy. He has been in this clinic before in 2018 with a laceration on his right hand and then again in 2019 with an area over the right lateral malleolus. 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 He states that the wound is been there for the last week or 2. It is been uncomfortable to walk on. The man is an active man having to look after an elderly mother. Past medical history, type 2 diabetes with polyneuropathy with a recent  hemoglobin A1c in September 2011 0.3, lumbar disc disease, hypergonadism., Left foot drop and gout. We did not repeat his ABIs in the clinic quoting values from July 2019 of 0.95 and 1.01. He is not felt to have significant PAD 02/19/2019 on evaluation today patient actually appears to be doing better with regard to his foot wound based on what I am seeing currently. He was seen by Dr. Dellia Nims on readmission back into the clinic last week when I was off. The good news is the patient has been tolerating the dressing changes without any complication in fact has not really had much in the way of drainage. He does have an AFO brace which she believes is actually pushing on this area as well and has contributed to the issue. Fortunately there is no signs of active infection at this point. His ABIs appear to be well. 03/05/2019 on evaluation today patient appears to be doing well in regard to his foot ulcer. He has been tolerating the dressing changes without complication. Fortunately there is no signs of active infection. The wound is measuring somewhat smaller and overall I feel like he is making good progress at this point. 03/12/2019 on evaluation today patient actually appears to be doing quite well with regard to his foot ulcer. The wound is looking  better the measurement is not significantly better at this point unfortunately but nonetheless again the overall appearance is improving I think he is making some progress. I still think the big issue here is foot drop and again the patient wants to see if potentially he could get a brace to help with this. Subsequently I think that we can definitely make a referral to [facility] to see if they can help [place] in this regard. He is in agreement with that plan. 03/26/2019 on evaluation today patient appears to be doing more poorly in regard to his foot ulcer at this time. He did see podiatry in order to see about getting a brace but unfortunately he tells me that  Medicare is not likely to pay for one for him as he had 1 I guess 8 months ago. However this one that apparently goes in his shoes that does not seem to be working out well for him. Unfortunately there are signs of active infection at this time. No fevers, chills, nausea, vomiting, or diarrhea. Systemically. The patient has erythema on the dorsal surface of his foot that tracks from the lateral portion of his foot and again he does have a blister around the wound as well which is worse compared to previous. Overall I am concerned. 04/02/2019 on evaluation today patient appears to be doing about the same with regard to his wound. Unfortunately there is no signs of significant improvement although I do think there is no signs of infection which is good news. His drainage is also slowed down tremendously. I think at this point he would benefit from the initiation of a total contact cast. The only concern I have is that over his balance. I explained to the patient that if we were to initiate the cast I think it would be greatly beneficial for him but he would need to ensure not to walk at all unless he uses a walker he actually has 2 walkers one for inside his home and one that he keeps out in the car. For that reason he would be covered in that regard. I just do not want him to have any accidents and fall with the cast. 12/28-Patient comes in 1 week out a day early for his appointment he was in the hospital for 2 days for uncontrolled hyperglycemia and required management of high blood sugars, with some adjustments in his home regimen of insulin, patient stubbed his right great toe today and put a Band-Aid on it, he is here for his left lateral foot ulcer and we are using silver alginate dressing, he was intact total contact cast with difficulty with balance to the extent that that had to be discontinued 04/16/2019 on evaluation today patient appears to be doing better with regard to the overall appearance  of his wound size. This is good news. We had attempted a total contact cast but unfortunately he had some falling issues that the family contacted [place] about we took the cast off we did not put it back on. Nonetheless it ended up that just a couple days later he was in the hospital due to elevations in his blood sugar which went to around 500 and even a little higher. Subsequently he was admitted to the hospital and was in the hospital for a total of 3 days and that included Christmas day. Fortunately there got his blood sugar under control he tells me that this is running about 200 now which is much better news. There does  not appear to be any signs of active infection at this time. No fevers, chills, nausea, vomiting, or diarrhea. 04/30/2019 on evaluation today patient actually appears to be doing slightly worse compared to previous as far as the overall size of his wound is concerned. There also is some need for sharp debridement today as well. Fortunately there is no evidence of active infection which is good news. No fevers, chills, nausea, vomiting, or diarrhea. 05/07/2019 upon evaluation today patient appears to be doing excellent in regard to his plantar foot ulcer. He has been tolerating the dressing changes without complication. Fortunately there is no signs of active infection at this time. No fevers, chills, nausea, vomiting, or diarrhea. He does tell me that he actually got in touch with [employer] and they feel like they can add diagnosis codes to his orders in order to get an external brace for him as opposed to the one that goes internal to issue. That would be excellent for his foot drop and I did tell him that if there is anything I need to fill out in that regard I will be more than happy to oblige and fill out what is needed for [employer]. 05/14/2019 upon evaluation today patient's original wound site actually appears to be doing much better which is good news. Fortunately there is no signs  of active infection at this time. No fevers, chills, nausea, vomiting, or diarrhea. With that being said the patient unfortunately is continuing to have issues with rubbing he tells me that the felt slid on his foot although he did not realize it until today. Nonetheless I think this did cause a blister around the edge of the wound and he does an open wound that is slightly larger overall in measurement compared to last time mainly due to this blister. Other than that I feel like he is doing quite well. 05/21/2019 upon evaluation today patient appears to be doing excellent in regard to his wound at this time on the plantar foot. He is showing signs of improvement he does have some slough and biofilm noted on the surface of the wound this is going require some sharp debridement today as well. Fortunately there is no evidence of active infection. No fevers, chills, nausea, vomiting, or diarrhea. 05/28/2019 on evaluation today patient's wound actually appears to be measuring smaller upon initial inspection and I do believe that it is doing well. JAMALE, SPANGLER ([id number]) With that being said he continues to have a blister on the outside/lateral edge of the wound that occurs as a result of rubbing due to his dropfoot currently. Subsequently we have filled out the paperwork for his new external brace but again that does take several weeks to get that completed once everything is returned to back we did send this back last week as far as the paperwork was concerned. With that being said there does not appear to be any signs of active infection at this time. 06/04/2019 on evaluation today patient's wound actually appears to be doing slightly better compared to last week which is good news. He still states that his insurance unfortunately does not seem to want to cover any of his new brace. He is going to just pay for this himself. Nonetheless he is going by there today when he leaves here that is [employer] in  [place] that is going to be making this brace for him. 06/18/2019 upon evaluation today patient fortunately does have his new brace which is the external AFO brace with  bilateral uprights. Subsequently he seems to be doing much better in fact the wound already appears better. He has not even been using the felt he is just using a dressing over top of the wound and overall I am very pleased with how things seem to be progressing. There is no signs of active infection at this time which is great news and I think that the AFO is good to help him tremendously as far as trying to keep pressure off of the 06/25/2019 upon evaluation today patient seems to be making good progress in regard to his wound. He has been tolerating the dressing changes without complication. Fortunately there is no signs of active infection at this time. No fevers, chills, nausea, vomiting, or diarrhea. Electronic Signature(s) Signed: 06/25/2019 1:24:44 PM By: Worthy Keeler [name] Entered By: Worthy Keeler on 06/25/2019 13:24:44 [name] ([id number]) -------------------------------------------------------------------------------- Physical Exam Details Patient Name: [name] Date of Service: 06/25/2019 11:00 AM Medical Record Number: [id number] Patient Account Number: [id number] Date of Birth/Sex: 1946-04-11 (73 y.o. M) Treating RN: Primary Care Provider: [name] Other Clinician: Referring Provider: [name] Treating Provider/Extender: [name], Averi Kilty Weeks in Treatment: 93 Constitutional Well-nourished and well-hydrated in no acute distress. Respiratory normal breathing without difficulty. Psychiatric this patient is able to make decisions and demonstrates good insight into disease process. Alert and Oriented x 3. pleasant and cooperative. Notes Patient's wound bed currently showed signs of some callus around the edges of the wound which did require sharp debridement today. There  was some slough noted as well on the surface of the wound. Post debridement the wound appears to be doing significantly better which is good news. Hopefully he will continue to make good progress going forward now that he has his new brace. Electronic Signature(s) Signed: 06/25/2019 1:25:17 PM By: Worthy Keeler [name] Entered By: Worthy Keeler on 06/25/2019 13:25:17 [name] ([id number]) -------------------------------------------------------------------------------- Physician Orders Details Patient Name: [name] Date of Service: 06/25/2019 11:00 AM Medical Record Number: [id number] Patient Account Number: [id number] Date of Birth/Sex: [birth year] (73 y.o. M) Treating RN: Army Melia Primary Care Provider: [name] Other Clinician: Referring Provider: [name] Treating Provider/Extender: Melburn Hake, Ahnesti Townsend Weeks in Treatment: 51 Verbal / Phone Orders: No Diagnosis Coding ICD-10 Coding Code Description E11.621 Type 2 diabetes mellitus with foot ulcer E11.42 Type 2 diabetes mellitus with diabetic polyneuropathy L97.522 Non-pressure chronic ulcer of other part of left foot with fat layer exposed M21.372 Foot drop, left foot Wound Cleansing Wound #5 Left,Lateral Foot o May shower with protection. Anesthetic (add to Medication List) Wound #5 Left,Lateral Foot o Topical Lidocaine 4% cream applied to wound bed prior to debridement (In Clinic Only). Primary Wound Dressing Wound #5 Left,Lateral Foot o Silver Collagen Secondary Dressing Wound #5 [place] Dressing Change Frequency Wound #5 Left,Lateral Foot o Change dressing every other day. Follow-up Appointments Wound #5 Left,Lateral Foot o Return Appointment in 1 week. Edema Control Wound #5 Left,Lateral Foot o Elevate legs to the level of the heart and pump ankles as often as possible Additional Orders / Instructions Wound #5 Left,Lateral Foot o Other: -  Patient to follow-up with [employer] for adjustments to braces and shoes. Requested paperwork sent to [employer]. Electronic Signature(s) Signed: 06/25/2019 1:35:00 PM By: Worthy Keeler [name] Signed: 06/26/2019 10:13:19 AM By: Army Melia Entered By: Army Melia on 06/25/2019 11:38:40 [name] ([id number]) -------------------------------------------------------------------------------- Problem List Details Patient Name: [name] Date of Service: 06/25/2019  11:00 AM Medical Record Number: [id number] Patient Account Number: [id number] Date of Birth/Sex: 23-Jun-1945 (74 y.o. M) Treating RN: Primary Care Provider: [name] Other Clinician: Referring Provider: [name] Treating Provider/Extender: Melburn Hake, Alveria Mcglaughlin Weeks in Treatment: 19 Active Problems ICD-10 Evaluated Encounter Code Description Active Date Today Diagnosis E11.621 Type 2 diabetes mellitus with foot ulcer 02/09/2019 No Yes E11.42 Type 2 diabetes mellitus with diabetic polyneuropathy 02/09/2019 No Yes L97.522 Non-pressure chronic ulcer of other part of left foot with fat layer exposed 02/09/2019 No Yes M21.372 Foot drop, left foot 02/09/2019 No Yes Inactive Problems Resolved Problems Electronic Signature(s) Signed: 06/25/2019 11:02:55 AM By: Worthy Keeler [name] Entered By: Worthy Keeler on 06/25/2019 11:02:54 [name] ([id number]) -------------------------------------------------------------------------------- Progress Note Details Patient Name: [name] Date of Service: 06/25/2019 11:00 AM Medical Record Number: [id number] Patient Account Number: [id number] Date of Birth/Sex: 03/08/1946 (73 y.o. M) Treating RN: Primary Care Provider: [name] Other Clinician: Referring Provider: [name] Treating Provider/Extender: Melburn Hake, Alexyia Guarino Weeks in Treatment: 19 Subjective Chief Complaint Information obtained from Patient Left dorsal foot ulcer 02/09/2019; patient  is here for review of the wound on the left lateral foot roughly at the level of the base of the fifth metatarsal History of Present Illness (HPI) 10/04/16 on evaluation today patient presents for initial evaluation concerning a laceration of the right wrist and hand on the bowler aspect. He tells me that he fell into glass following a medication change in regard to his diabetes. He initially went to [facility] where she was sutured and subsequently his daughter who is a Marine scientist removed the teachers at the appropriate time. Unfortunately the wound has done poorly and dehissed which has caused him some trouble including infection. He has been on antibiotics both topically and orally though the wound continues to show signs of necrotic tissue according to notes which is what he was sent to [place] for evaluation concerning. He does have diabetes and unfortunately this is severely uncontrolled. He does see an endocrinologist and is working on this. 10/11/2016 -- the patient's notes from the ER were reviewed and he had his suturing done on 09/17/2016 and had necrotic skin flaps which had to be trimmed the last visit he was here. He has managed to get his Santyl ointment and has been using this appropriately. 10/21/16 patient's wound appears to be doing very well on evaluation today. He still has some Slough covering the wound bed but this is nowhere near as severe as when i saw this initially two weeks ago nor even my review of his pictures last week. I'm pleased with how this is progressing. Patient's wound over the right form appears to be doing fairly well on evaluation today. There is no evidence of infection and he has a small area at the crease of where she is wrist extends and flexes that is still open and appears to be having difficulty due to the fact that it is cracking open. I think this is something that may be controlled with moisture control that is keeping the wound a little bit more moist.  Nonetheless this is a tiny region compared to the initial wound and overall appears to be doing very well. No fevers, chills, nausea, or vomiting noted at this time. 11/25/16 on evaluation today patient appears to be doing well in regard to his right wrist ulcer. In fact this appears to be completely healed he is having no discomfort. He is pleased with how this is progressed.  Readmission: 06/24/17 on evaluation today patient appears back in our clinic regarding issues that he has been having with his right ankle on the lateral malleolus as well is in the dorsal surface of his left foot. Both have been going on for several weeks unfortunately. He tells me that initially this was being managed by Dr. Edilia Bo and I did review the notes from Dr. Edilia Bo in epic. Patient was placed on amoxicillin initially and then subsequently Keflex following. He has been using triple antibiotic ointment over-the-counter along with a Band-Aid for the wound currently. He did not have any Santyl remaining. His most recent hemoglobin A1c which was just two days ago was 10.3. Fortunately he seems to have excellent blood flow in the bilateral lower extremities with his left ABI being 0.95 in the right ABI 1.01. Patient does have some pain in regard to each of these ulcerations but he tells me it's not nearly as severe as the hand wound that I help treat earlier over the summer. This was in 2018. 07/15/17 on evaluation today patient's lower surety ulcer is actually both appear to be doing fairly well at this point. He does have a little bit of skin overlapping the ulcer on the right lateral ankle this was debrided away today just with saline and gauze without complication patient had no discomfort or pain. Otherwise patient's left foot ulcer does appear to be showing signs of improvement he does seem to have a right great toe. Nikki and noted at this point in time. I think that this does need to be managed at this point. No  fevers, chills, nausea, or vomiting noted at this time. 07/22/17 on evaluation today patient's wounds in regard to his lower extremities appear to be doing somewhat better. His right ankle wound in fact appears to likely be completely closed. The left dorsal foot ulcer though still open does appear to be a little bit smaller and does also seem to be making good progress. He has not been having problems with the treatment at this point in the general he does not seem to show as much erythema surrounding the wound bed which is good news. He is not having significant discomfort. 08/12/17 on evaluation today patients wounds of both appeared to be doing better in fact they both appear to be completely healed. Overall I'm very pleased with how things have progressed over the past week even more than what I expected. Obviously this is good news and he is happy about this. He did have a fall when going to his orthopedic doctor recently fortunately he does not appear to have injured anything severely but nonetheless does have a couple of bumps and bruises nothing significant at this time but he needs to be seen and wound care for. He may have broken his left wrist he is following up with orthopedics in that regard. Readmission: 10/24/17 on evaluation today patient presents for reevaluation after having been discharged Aug 12, 2017. His wound was healed at that point although he states that it has been somewhat discolored since that time. He has not experienced the drainage as far as the wound is concerned but the fact that it was still discolored been greater than two months after the fact made him want to come in for reevaluation. Upon inspection today the patient has no pain although he does have neuropathy. He does have a discolored region of the dorsal surface of the left foot which was the site where the ulcer was Bynum,  Derec E. ([id number]) that we previously treated. The good news is he does not again  have any discomfort although obviously I do believe this may be some scar tissue and just discoloration in that regard. Again he hasn't had any drainage or discharge since I last saw him and at this point I see no evidence of that either this area shows complete epithelialization. In general I do not feel like there's any significant issue at this point in that regard. 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 He has been in this clinic before in 2018 with a laceration on his right hand and then again in 2019 with an area over the right lateral malleolus. 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 He states that the wound is been there for the last week or 2. It is been uncomfortable to walk on. The man is an active man having to look after an elderly mother. Past medical history, type 2 diabetes with polyneuropathy with a recent hemoglobin A1c in September 2011 0.3, lumbar disc disease, hypergonadism., Left foot drop and gout. We did not repeat his ABIs in the clinic quoting values from July 2019 of 0.95 and 1.01. He is not felt to have significant PAD 02/19/2019 on evaluation today patient actually appears to be doing better with regard to his foot wound based on what I am seeing currently. He was seen by Dr. Dellia Nims on readmission back into the clinic last week when I was off. The good news is the patient has been tolerating the dressing changes without any complication in fact has not really had much in the way of drainage. He does have an AFO brace which she believes is actually pushing on this area as well and has contributed to the issue. Fortunately there is no signs of active infection at this point. His ABIs appear to be well. 03/05/2019 on evaluation today patient appears to be doing well in regard to his foot ulcer. He has  been tolerating the dressing changes without complication. Fortunately there is no signs of active infection. The wound is measuring somewhat smaller and overall I feel like he is making good progress at this point. 03/12/2019 on evaluation today patient actually appears to be doing quite well with regard to his foot ulcer. The wound is looking better the measurement is not significantly better at this point unfortunately but nonetheless again the overall appearance is improving I think he is making some progress. I still think the big issue here is foot drop and again the patient wants to see if potentially he could get a brace to help with this. Subsequently I think that we can definitely make a referral to [facility] to see if they can help [place] in this regard. He is in agreement with that plan. 03/26/2019 on evaluation today patient appears to be doing more poorly in regard to his foot ulcer at this time. He did see podiatry in order to see about getting a brace but unfortunately he tells me that Medicare is not likely to pay for one for him as he had 1 I guess 8 months ago. However this one that apparently goes in his shoes that does not seem to be working out well for him. Unfortunately there are signs of active infection at this time. No fevers, chills, nausea, vomiting, or  diarrhea. Systemically. The patient has erythema on the dorsal surface of his foot that tracks from the lateral portion of his foot and again he does have a blister around the wound as well which is worse compared to previous. Overall I am concerned. 04/02/2019 on evaluation today patient appears to be doing about the same with regard to his wound. Unfortunately there is no signs of significant improvement although I do think there is no signs of infection which is good news. His drainage is also slowed down tremendously. I think at this point he would benefit from the initiation of a total contact cast. The only concern I have  is that over his balance. I explained to the patient that if we were to initiate the cast I think it would be greatly beneficial for him but he would need to ensure not to walk at all unless he uses a walker he actually has 2 walkers one for inside his home and one that he keeps out in the car. For that reason he would be covered in that regard. I just do not want him to have any accidents and fall with the cast. 12/28-Patient comes in 1 week out a day early for his appointment he was in the hospital for 2 days for uncontrolled hyperglycemia and required management of high blood sugars, with some adjustments in his home regimen of insulin, patient stubbed his right great toe today and put a Band-Aid on it, he is here for his left lateral foot ulcer and we are using silver alginate dressing, he was intact total contact cast with difficulty with balance to the extent that that had to be discontinued 04/16/2019 on evaluation today patient appears to be doing better with regard to the overall appearance of his wound size. This is good news. We had attempted a total contact cast but unfortunately he had some falling issues that the family contacted [place] about we took the cast off we did not put it back on. Nonetheless it ended up that just a couple days later he was in the hospital due to elevations in his blood sugar which went to around 500 and even a little higher. Subsequently he was admitted to the hospital and was in the hospital for a total of 3 days and that included Christmas day. Fortunately there got his blood sugar under control he tells me that this is running about 200 now which is much better news. There does not appear to be any signs of active infection at this time. No fevers, chills, nausea, vomiting, or diarrhea. 04/30/2019 on evaluation today patient actually appears to be doing slightly worse compared to previous as far as the overall size of his wound is concerned. There also is some need  for sharp debridement today as well. Fortunately there is no evidence of active infection which is good news. No fevers, chills, nausea, vomiting, or diarrhea. 05/07/2019 upon evaluation today patient appears to be doing excellent in regard to his plantar foot ulcer. He has been tolerating the dressing changes without complication. Fortunately there is no signs of active infection at this time. No fevers, chills, nausea, vomiting, or diarrhea. He does tell me that he actually got in touch with [employer] and they feel like they can add diagnosis codes to his orders in order to get an external brace for him as opposed to the one that goes internal to issue. That would be excellent for his foot drop and I did tell him that if  there is anything I need to fill out in that regard I will be more than happy to oblige and fill out what is needed for [employer]. 05/14/2019 upon evaluation today patient's original wound site actually appears to be doing much better which is good news. Fortunately there is no signs of active infection at this time. No fevers, chills, nausea, vomiting, or diarrhea. With that being said the patient unfortunately is continuing to have issues with rubbing he tells me that the felt slid on his foot although he did not realize it until today. Nonetheless I think this did cause a blister around the edge of the wound and he does an open wound that is slightly larger overall in measurement compared to last time mainly due to this blister. Other than that I feel like he is doing quite well. BRIDGER, PIZZI ([id number]) 05/21/2019 upon evaluation today patient appears to be doing excellent in regard to his wound at this time on the plantar foot. He is showing signs of improvement he does have some slough and biofilm noted on the surface of the wound this is going require some sharp debridement today as well. Fortunately there is no evidence of active infection. No fevers, chills, nausea, vomiting,  or diarrhea. 05/28/2019 on evaluation today patient's wound actually appears to be measuring smaller upon initial inspection and I do believe that it is doing well. With that being said he continues to have a blister on the outside/lateral edge of the wound that occurs as a result of rubbing due to his dropfoot currently. Subsequently we have filled out the paperwork for his new external brace but again that does take several weeks to get that completed once everything is returned to back we did send this back last week as far as the paperwork was concerned. With that being said there does not appear to be any signs of active infection at this time. 06/04/2019 on evaluation today patient's wound actually appears to be doing slightly better compared to last week which is good news. He still states that his insurance unfortunately does not seem to want to cover any of his new brace. He is going to just pay for this himself. Nonetheless he is going by there today when he leaves here that is [employer] in [place] that is going to be making this brace for him. 06/18/2019 upon evaluation today patient fortunately does have his new brace which is the external AFO brace with bilateral uprights. Subsequently he seems to be doing much better in fact the wound already appears better. He has not even been using the felt he is just using a dressing over top of the wound and overall I am very pleased with how things seem to be progressing. There is no signs of active infection at this time which is great news and I think that the AFO is good to help him tremendously as far as trying to keep pressure off of the 06/25/2019 upon evaluation today patient seems to be making good progress in regard to his wound. He has been tolerating the dressing changes without complication. Fortunately there is no signs of active infection at this time. No fevers, chills, nausea, vomiting, or  diarrhea. Objective Constitutional Well-nourished and well-hydrated in no acute distress. Vitals Time Taken: 11:03 AM, Height: 71 in, Weight: 205 lbs, BMI: 28.6, Temperature: 99.1 F, Pulse: 100 bpm, Respiratory Rate: 16 breaths/min, Blood Pressure: 158/79 mmHg. Respiratory normal breathing without difficulty. Psychiatric this patient is able to make decisions  and demonstrates good insight into disease process. Alert and Oriented x 3. pleasant and cooperative. General Notes: Patient's wound bed currently showed signs of some callus around the edges of the wound which did require sharp debridement today. There was some slough noted as well on the surface of the wound. Post debridement the wound appears to be doing significantly better which is good news. Hopefully he will continue to make good progress going forward now that he has his new brace. Integumentary (Hair, Skin) Wound #5 status is Open. Original cause of wound was Pressure Injury. The wound is located on the Left,Lateral Foot. The wound measures 0.1cm length x 0.2cm width x 0.1cm depth; 0.016cm^2 area and 0.002cm^3 volume. The wound is limited to skin breakdown. There is no tunneling or undermining noted. There is a small amount of serosanguineous drainage noted. The wound margin is flat and intact. There is no granulation within the wound bed. There is no necrotic tissue within the wound bed. 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 ([id number]) Procedures Wound #5 Pre-procedure diagnosis of Wound #5 is a Diabetic Wound/Ulcer of the Lower Extremity located on the Left,Lateral Foot .Severity of Tissue Pre Debridement is: Fat layer exposed. There was a Excisional Skin/Subcutaneous Tissue Debridement with a total area of 0.02 sq  cm performed by [name], Danyetta Gillham E., [name]. With the following instrument(s): Curette to remove Viable and Non-Viable tissue/material. Material removed includes Callus and Subcutaneous Tissue and after achieving pain control using Lidocaine. A time out was conducted at 11:35, prior to the start of the procedure. There was no bleeding. The procedure was tolerated well. Post Debridement Measurements: 0.1cm length x 0.2cm width x 0.1cm depth; 0.002cm^3 volume. Character of Wound/Ulcer Post Debridement is stable. Severity of Tissue Post Debridement is: Fat layer exposed. Post procedure Diagnosis Wound #5: Same as Pre-Procedure Plan Wound Cleansing: Wound #5 Left,Lateral Foot: May shower with protection. Anesthetic (add to Medication List): Wound #5 Left,Lateral Foot: Topical Lidocaine 4% cream applied to wound bed prior to debridement (In Clinic Only). Primary Wound Dressing: Wound #5 Left,Lateral Foot: Silver Collagen Secondary Dressing: Wound #5 Left,Lateral Foot: [place] Dressing Change Frequency: Wound #5 Left,Lateral Foot: Change dressing every other day. Follow-up Appointments: Wound #5 Left,Lateral Foot: Return Appointment in 1 week. Edema Control: Wound #5 Left,Lateral Foot: Elevate legs to the level of the heart and pump ankles as often as possible Additional Orders / Instructions: Wound #5 Left,Lateral Foot: Other: - Patient to follow-up with [employer] for adjustments to braces and shoes. Requested paperwork sent to [employer]. 1. I would recommend currently that we continue with the current wound care measures specifically with regard to the silver collagen dressing. This is covered with [place] dressing which I think is still the best way to go. 2. I am also can recommend currently that we continue with the use of his external foot drop brace which seems to be doing well for him at this time as well. 3. I am also going to suggest currently that the patient continue as  much as possible to limit his walking until we can get this completely healed up. We will see patient back for reevaluation in 1 week here in the clinic. If anything worsens or changes patient will contact our office for additional recommendations. Electronic Signature(s) Signed: 06/25/2019 1:28:16 PM By:  Melburn Hake, Dortha Neighbors [name] Entered By: Worthy Keeler on 06/25/2019 13:28:15 [name] ([id number]) -------------------------------------------------------------------------------- SuperBill Details Patient Name: [name] Date of Service: 06/25/2019 Medical Record Number: [id number] Patient Account Number: [id number] Date of Birth/Sex: Dec 11, 1945 (75 y.o. M) Treating RN: Primary Care Provider: [name] Other Clinician: Referring Provider: [name] Treating Provider/Extender: Melburn Hake, Maxfield Gildersleeve Weeks in Treatment: 19 Diagnosis Coding ICD-10 Codes Code Description E11.621 Type 2 diabetes mellitus with foot ulcer E11.42 Type 2 diabetes mellitus with diabetic polyneuropathy L97.522 Non-pressure chronic ulcer of other part of left foot with fat layer exposed M21.372 Foot drop, left foot Facility Procedures CPT4 Code: [id number] Description: 83462 - DEB SUBQ TISSUE 20 SQ CM/< Modifier: Quantity: 1 CPT4 Code: Description: ICD-10 Diagnosis Description L97.522 Non-pressure chronic ulcer of other part of left foot with fat layer exposed Modifier: Quantity: Physician Procedures CPT4 Code: 1947125 Description: 11042 - WC PHYS SUBQ TISS 20 SQ CM Modifier: Quantity: 1 CPT4 Code: Description: ICD-10 Diagnosis Description L97.522 Non-pressure chronic ulcer of other part of left foot with fat layer exposed Modifier: Quantity: Electronic Signature(s) Signed: 06/25/2019 1:28:49 PM By: Worthy Keeler [name] Entered By: Worthy Keeler on 06/25/2019 13:28:48

## 2019-06-25 NOTE — Progress Notes (Addendum)
DEMETRUS, PAVAO ([id number]) Visit Report for 06/25/2019 Arrival Information Details Patient Name: [name], [name] Date of Service: 06/25/2019 11:00 AM Medical Record Number: [id number] Patient Account Number: [id number] Date of Birth/Sex: 1945-11-07 (73 y.o. M) Treating RN: Primary Care Tae Robak: [name] Other Clinician: Referring Andie Mungin: [name] Treating Dakia Schifano/Extender: Melburn Hake, HOYT Weeks in Treatment: 73 Visit Information History Since Last Visit Added or deleted any medications: No Patient Arrived: Cane Any new allergies or adverse reactions: No Arrival Time: 11:03 Had a fall or experienced change in No Accompanied By: self activities of daily living that may affect Transfer Assistance: None risk of falls: Patient Identification Verified: Yes Signs or symptoms of abuse/neglect since last visito No Secondary Verification Process Completed: Yes Hospitalized since last visit: No Patient Has Alerts: Yes Implantable device outside of the clinic excluding No Patient Alerts: DMII cellular tissue based products placed in the center since last visit: Has Dressing in Place as Prescribed: Yes Pain Present Now: No Electronic Signature(s) Signed: 06/25/2019 4:23:57 PM By: Lorine Bears RCP, RRT, CHT Entered By: Lorine Bears on 06/25/2019 11:05:08 [name] ([id number]) -------------------------------------------------------------------------------- Encounter Discharge Information Details Patient Name: [name] Date of Service: 06/25/2019 11:00 AM Medical Record Number: [id number] Patient Account Number: [id number] Date of Birth/Sex: [birth year] (73 y.o. M) Treating RN: Army Melia Primary Care Jaidin Richison: [name] Other Clinician: Referring Yomaris Palecek: [name] Treating Zackrey Dyar/Extender: Melburn Hake, HOYT Weeks in Treatment: 2 Encounter Discharge Information Items Post Procedure Vitals Discharge  Condition: Stable Temperature (F): 99.1 Ambulatory Status: Ambulatory Pulse (bpm): 100 Discharge Destination: Home Respiratory Rate (breaths/min): 16 Transportation: Private Auto Blood Pressure (mmHg): 158/79 Accompanied By: self Schedule Follow-up Appointment: Yes Clinical Summary of Care: Electronic Signature(s) Signed: 06/26/2019 10:13:19 AM By: Army Melia Entered By: Army Melia on 06/25/2019 11:41:07 [name] ([id number]) -------------------------------------------------------------------------------- Lower Extremity Assessment Details Patient Name: [name] Date of Service: 06/25/2019 11:00 AM Medical Record Number: [id number] Patient Account Number: [id number] Date of Birth/Sex: 1946/03/30 (73 y.o. M) Treating RN: Cornell Barman Primary Care Mekhi Sonn: [name] Other Clinician: Referring Maressa Apollo: [name] Treating Peytin Dechert/Extender: Melburn Hake, HOYT Weeks in Treatment: 19 Edema Assessment Assessed: [Left: No] [Right: No] Edema: [Left: N] [Right: o] Vascular Assessment Pulses: Dorsalis Pedis Palpable: [Left:Yes] Electronic Signature(s) Signed: 06/25/2019 4:45:00 PM By: Gretta Cool, BSN, RN, CWS, Kim RN, BSN Entered By: Gretta Cool, BSN, RN, CWS, Kim on 06/25/2019 11:14:20 [name], [name] ([id number]) -------------------------------------------------------------------------------- Multi Wound Chart Details Patient Name: [name] Date of Service: 06/25/2019 11:00 AM Medical Record Number: [id number] Patient Account Number: [id number] Date of Birth/Sex: 1945/10/29 (73 y.o. M) Treating RN: Army Melia Primary Care Roselinda Bahena: [name] Other Clinician: Referring Jahmani Staup: [name] Treating Kalen Ratajczak/Extender: Melburn Hake, HOYT Weeks in Treatment: 19 Vital Signs Height(in): 71 Pulse(bpm): 100 Weight(lbs): 205 Blood Pressure(mmHg): 158/79 Body Mass Index(BMI): 29 Temperature(F): 99.1 Respiratory Rate(breaths/min): 16 Photos:  [N/A:N/A] Wound Location: Left Foot - Lateral N/A N/A Wounding Event: Pressure Injury N/A N/A Primary Etiology: Diabetic Wound/Ulcer of the Lower N/A N/A Extremity Comorbid History: Cataracts, Middle ear problems, N/A N/A Sleep Apnea, Hypertension, Type II Diabetes, Gout, Osteoarthritis, Neuropathy Date Acquired: 01/19/2019 N/A N/A Weeks of Treatment: 19 N/A N/A Wound Status: Open N/A N/A Measurements L x W x D (cm) 0.1x0.2x0.1 N/A N/A Area (cm) : 0.016 N/A N/A Volume (cm) : 0.002 N/A N/A % Reduction in Area: 94.20% N/A N/A % Reduction in Volume: 97.60% N/A N/A Classification: Grade 2 N/A N/A Exudate Amount: Small N/A N/A Exudate Type: Serosanguineous N/A  N/A Exudate Color: red, brown N/A N/A Wound Margin: Flat and Intact N/A N/A Granulation Amount: None Present (0%) N/A N/A Necrotic Amount: None Present (0%) N/A N/A Exposed Structures: Fascia: No N/A N/A Fat Layer (Subcutaneous Tissue) Exposed: No Tendon: No Muscle: No Joint: No Bone: No Limited to Skin Breakdown Epithelialization: Large (67-100%) N/A N/A Treatment Notes Electronic Signature(s) Signed: 06/26/2019 10:13:19 AM By: Mena Goes, Grier Rocher ([id number]) Entered By: Army Melia on 06/25/2019 11:36:55 [name] ([id number]) -------------------------------------------------------------------------------- Multi-Disciplinary Care Plan Details Patient Name: [name] Date of Service: 06/25/2019 11:00 AM Medical Record Number: [id number] Patient Account Number: [id number] Date of Birth/Sex: 1945-07-08 (73 y.o. M) Treating RN: Army Melia Primary Care Quindarrius Joplin: [name] Other Clinician: Referring Tifani Dack: [name] Treating [name] [name]/Extender: Melburn Hake, HOYT Weeks in Treatment: 19 Active Inactive Pressure Nursing Diagnoses: Knowledge deficit related to management of pressures ulcers Goals: Patient will remain free from development of additional pressure ulcers Date  Initiated: 04/30/2019 Target Resolution Date: 05/14/2019 Goal Status: Active Patient/caregiver will verbalize understanding of pressure ulcer management Date Initiated: 04/30/2019 Target Resolution Date: 05/14/2019 Goal Status: Active Interventions: Assess: immobility, friction, shearing, incontinence upon admission and as needed Treatment Activities: Patient referred for pressure reduction/relief devices : 04/30/2019 Notes: Wound/Skin Impairment Nursing Diagnoses: Impaired tissue integrity Goals: Ulcer/skin breakdown will have a volume reduction of 30% by week 4 Date Initiated: 04/30/2019 Target Resolution Date: 05/31/2019 Goal Status: Active Interventions: Assess patient/caregiver ability to obtain necessary supplies Assess ulceration(s) every visit Treatment Activities: Referred to DME Kanitra Purifoy for dressing supplies : 04/30/2019 Skin care regimen initiated : 04/30/2019 Topical wound management initiated : 04/30/2019 Notes: Electronic Signature(s) Signed: 06/26/2019 10:13:19 AM By: Army Melia Entered By: Army Melia on 06/25/2019 11:36:46 [name] ([id number]) -------------------------------------------------------------------------------- Pain Assessment Details Patient Name: [name] Date of Service: 06/25/2019 11:00 AM Medical Record Number: [id number] Patient Account Number: [id number] Date of Birth/Sex: Jan 29, 1946 (73 y.o. M) Treating RN: Cornell Barman Primary Care Sylvan Sookdeo: [name] Other Clinician: Referring Keely Drennan: [name] Treating Keondrick Dilks/Extender: Melburn Hake, HOYT Weeks in Treatment: 19 Active Problems Location of Pain Severity and Description of Pain Patient Has Paino No Site Locations Pain Management and Medication Current Pain Management: Notes Patient denies pain at this time. Electronic Signature(s) Signed: 06/25/2019 4:45:00 PM By: Gretta Cool, BSN, RN, CWS, Kim RN, BSN Entered By: Gretta Cool, BSN, RN, CWS, Kim on 06/25/2019  11:11:54 [name] ([id number]) -------------------------------------------------------------------------------- Patient/Caregiver Education Details Patient Name: [name] Date of Service: 06/25/2019 11:00 AM Medical Record Number: [id number] Patient Account Number: [id number] Date of Birth/Gender: 1946-01-19 (73 y.o. M) Treating RN: Army Melia Primary Care Physician: [name] Other Clinician: Referring Physician: [name] Treating Physician/Extender: Sharalyn Ink in Treatment: 68 Education Assessment Education Provided To: Patient Education Topics Provided Wound/Skin Impairment: Handouts: Caring for Your Ulcer Methods: Demonstration, Explain/Verbal Responses: State content correctly Electronic Signature(s) Signed: 06/26/2019 10:13:19 AM By: Army Melia Entered By: Army Melia on 06/25/2019 11:40:22 [name] ([id number]) -------------------------------------------------------------------------------- Wound Assessment Details Patient Name: [name] Date of Service: 06/25/2019 11:00 AM Medical Record Number: [id number] Patient Account Number: [id number] Date of Birth/Sex: 23-Sep-1945 (73 y.o. M) Treating RN: Cornell Barman Primary Care Tonetta Napoles: [name] Other Clinician: Referring Raylin Diguglielmo: [name] Treating Nyomie Ehrlich/Extender: Melburn Hake, HOYT Weeks in Treatment: 19 Wound Status Wound Number: 5 Primary Diabetic Wound/Ulcer of the Lower Extremity Etiology: Wound Location: Left Foot - Lateral Wound Open Wounding Event: Pressure Injury Status: Date Acquired: 01/19/2019 Comorbid Cataracts, Middle ear problems, Sleep Apnea, Weeks  Of Treatment: 19 History: Hypertension, Type II Diabetes, Gout, Osteoarthritis, Clustered Wound: No Neuropathy Photos Wound Measurements Length: (cm) 0.1 % Redu Width: (cm) 0.2 % Redu Depth: (cm) 0.1 Epithe Area: (cm) 0.016 Tunne Volume: (cm) 0.002 Under ction in Area:  94.2% ction in Volume: 97.6% lialization: Large (67-100%) ling: No mining: No Wound Description Classification: Grade 2 Foul Wound Margin: Flat and Intact Sloug Exudate Amount: Small Exudate Type: Serosanguineous Exudate Color: red, brown Odor After Cleansing: No h/Fibrino No Wound Bed Granulation Amount: None Present (0%) Exposed Structure Necrotic Amount: None Present (0%) Fascia Exposed: No Fat Layer (Subcutaneous Tissue) Exposed: No Tendon Exposed: No Muscle Exposed: No Joint Exposed: No Bone Exposed: No Limited to Skin Breakdown Treatment Notes Wound #5 (Left, Lateral Foot) Notes Silver collagen, telfa [name], [name] ([id number]) Electronic Signature(s) Signed: 06/25/2019 4:45:00 PM By: Gretta Cool, BSN, RN, CWS, Kim RN, BSN Entered By: Gretta Cool, BSN, RN, CWS, Kim on 06/25/2019 11:13:36 [name] ([id number]) -------------------------------------------------------------------------------- [place] Details Patient Name: [name] Date of Service: 06/25/2019 11:00 AM Medical Record Number: [id number] Patient Account Number: [id number] Date of Birth/Sex: June 04, 1945 (73 y.o. M) Treating RN: Primary Care Farouk Vivero: [name] Other Clinician: Referring Oakes Mccready: [name] Treating Kimon Loewen/Extender: [name], HOYT Weeks in Treatment: 19 Vital Signs Time Taken: 11:03 Temperature (F): 99.1 Height (in): 71 Pulse (bpm): 100 Weight (lbs): 205 Respiratory Rate (breaths/min): 16 Body Mass Index (BMI): 28.6 Blood Pressure (mmHg): 158/79 Reference Range: 80 - 120 mg / dl Electronic Signature(s) Signed: 06/25/2019 4:23:57 PM By: Lorine Bears RCP, RRT, CHT Entered By: Lorine Bears on 06/25/2019 11:05:53

## 2019-06-29 ENCOUNTER — Telehealth: Payer: Self-pay | Admitting: Family Medicine

## 2019-06-29 NOTE — Telephone Encounter (Signed)
Patient called today He is going to get his covid vaccine on 4/7. He stated that same day he will be coming in to see you and also get his Testosterone injection done while he is here that morning, he wanted to make sure that it was okay to have these injections done the same day

## 2019-07-01 NOTE — Telephone Encounter (Signed)
No issues there.

## 2019-07-02 ENCOUNTER — Other Ambulatory Visit: Payer: Self-pay

## 2019-07-02 ENCOUNTER — Encounter: Payer: Medicare Other | Admitting: Physician Assistant

## 2019-07-02 DIAGNOSIS — G4730 Sleep apnea, unspecified: Secondary | ICD-10-CM | POA: Diagnosis not present

## 2019-07-02 DIAGNOSIS — I10 Essential (primary) hypertension: Secondary | ICD-10-CM | POA: Diagnosis not present

## 2019-07-02 DIAGNOSIS — M109 Gout, unspecified: Secondary | ICD-10-CM | POA: Diagnosis not present

## 2019-07-02 DIAGNOSIS — E11621 Type 2 diabetes mellitus with foot ulcer: Secondary | ICD-10-CM | POA: Diagnosis not present

## 2019-07-02 DIAGNOSIS — Z794 Long term (current) use of insulin: Secondary | ICD-10-CM | POA: Diagnosis not present

## 2019-07-02 DIAGNOSIS — M21372 Foot drop, left foot: Secondary | ICD-10-CM | POA: Diagnosis not present

## 2019-07-02 DIAGNOSIS — E1142 Type 2 diabetes mellitus with diabetic polyneuropathy: Secondary | ICD-10-CM | POA: Diagnosis not present

## 2019-07-02 DIAGNOSIS — M1990 Unspecified osteoarthritis, unspecified site: Secondary | ICD-10-CM | POA: Diagnosis not present

## 2019-07-02 DIAGNOSIS — L97522 Non-pressure chronic ulcer of other part of left foot with fat layer exposed: Secondary | ICD-10-CM | POA: Diagnosis not present

## 2019-07-02 NOTE — Progress Notes (Signed)
[name], [name] ([id number]) Visit Report for 07/02/2019 Arrival Information Details Patient Name: [name] Date of Service: 07/02/2019 11:00 AM Medical Record Number: [id number] Patient Account Number: [id number] Date of Birth/Sex: 09/21/1945 (73 y.o. M) Treating RN: Montey Hora Primary Care Cord Wilczynski: [name] Other Clinician: Referring Shalona Harbour: [name] Treating Arissa Fagin/Extender: Melburn Hake, HOYT Weeks in Treatment: 20 Visit Information History Since Last Visit Added or deleted any medications: No Patient Arrived: Ambulatory Any new allergies or adverse reactions: No Arrival Time: 11:06 Had a fall or experienced change in No Accompanied By: self activities of daily living that may affect Transfer Assistance: None risk of falls: Patient Identification Verified: Yes Signs or symptoms of abuse/neglect since last visito No Secondary Verification Process Completed: Yes Hospitalized since last visit: No Patient Has Alerts: Yes Implantable device outside of the clinic excluding No Patient Alerts: DMII cellular tissue based products placed in the center since last visit: Has Dressing in Place as Prescribed: Yes Has Footwear/Offloading in Place as Prescribed: Yes Left: Other:brace Pain Present Now: No Electronic Signature(s) Signed: 07/02/2019 4:17:09 PM By: Montey Hora Entered By: Montey Hora on 07/02/2019 11:07:25 [name] ([id number]) -------------------------------------------------------------------------------- Encounter Discharge Information Details Patient Name: [name] Date of Service: 07/02/2019 11:00 AM Medical Record Number: [id number] Patient Account Number: [id number] Date of Birth/Sex: 09-May-1945 (73 y.o. M) Treating RN: Army Melia Primary Care Americo Vallery: [name] Other Clinician: Referring Tareek Sabo: [name] Treating Shenekia Riess/Extender: Melburn Hake, HOYT Weeks in Treatment: 20 Encounter Discharge  Information Items Post Procedure Vitals Discharge Condition: Stable Temperature (F): 98.2 Ambulatory Status: Ambulatory Pulse (bpm): 115 Discharge Destination: Home Respiratory Rate (breaths/min): 16 Transportation: Private Auto Blood Pressure (mmHg): 160/80 Accompanied By: self Schedule Follow-up Appointment: Yes Clinical Summary of Care: Electronic Signature(s) Signed: 07/02/2019 4:14:01 PM By: Army Melia Entered By: Army Melia on 07/02/2019 11:24:06 [name] ([id number]) -------------------------------------------------------------------------------- Lower Extremity Assessment Details Patient Name: [name] Date of Service: 07/02/2019 11:00 AM Medical Record Number: [id number] Patient Account Number: [id number] Date of Birth/Sex: [birth year] (73 y.o. M) Treating RN: Montey Hora Primary Care Raeya Merritts: [name] Other Clinician: Referring Evani Shrider: [name] Treating Jaymar Loeber/Extender: [name], HOYT Weeks in Treatment: 20 Edema Assessment Assessed: [Left: No] [Right: No] Edema: [Left: N] [Right: o] Vascular Assessment Pulses: Dorsalis Pedis Palpable: [Left:Yes] Electronic Signature(s) Signed: 07/02/2019 4:17:09 PM By: Montey Hora Entered By: Montey Hora on 07/02/2019 11:09:25 [name] ([id number]) -------------------------------------------------------------------------------- Multi Wound Chart Details Patient Name: [name] Date of Service: 07/02/2019 11:00 AM Medical Record Number: [id number] Patient Account Number: [id number] Date of Birth/Sex: 1946-01-15 (73 y.o. M) Treating RN: Army Melia Primary Care [place] Seaborn: [name] Other Clinician: Referring Ailed Defibaugh: [name] Treating Nalini Alcaraz/Extender: Melburn Hake, HOYT Weeks in Treatment: 20 Vital Signs Height(in): 71 Pulse(bpm): 115 Weight(lbs): 205 Blood Pressure(mmHg): 160/80 Body Mass Index(BMI): 29 Temperature(F): 98.2 Respiratory  Rate(breaths/min): 16 Photos: [N/A:N/A] Wound Location: Left Foot - Lateral N/A N/A Wounding Event: Pressure Injury N/A N/A Primary Etiology: Diabetic Wound/Ulcer of the Lower N/A N/A Extremity Comorbid History: Cataracts, Middle ear problems, N/A N/A Sleep Apnea, Hypertension, Type II Diabetes, Gout, Osteoarthritis, Neuropathy Date Acquired: 01/19/2019 N/A N/A Weeks of Treatment: 20 N/A N/A Wound Status: Open N/A N/A Measurements L x W x D (cm) 0.8x1x0.1 N/A N/A Area (cm) : 0.628 N/A N/A Volume (cm) : 0.063 N/A N/A % Reduction in Area: -128.40% N/A N/A % Reduction in Volume: 23.20% N/A N/A Classification: Grade 2 N/A N/A Exudate Amount: Small N/A N/A Exudate Type: Serosanguineous N/A N/A Exudate  Color: red, brown N/A N/A Wound Margin: Flat and Intact N/A N/A Granulation Amount: Large (67-100%) N/A N/A Granulation Quality: Pink N/A N/A Necrotic Amount: None Present (0%) N/A N/A Exposed Structures: Fat Layer (Subcutaneous Tissue) N/A N/A Exposed: Yes Fascia: No Tendon: No Muscle: No Joint: No Bone: No Epithelialization: Medium (34-66%) N/A N/A Treatment Notes Electronic Signature(s) Signed: 07/02/2019 4:14:01 PM By: Mena Goes, Grier Rocher ([id number]) Entered By: Army Melia on 07/02/2019 11:18:49 [name] ([id number]) -------------------------------------------------------------------------------- Multi-Disciplinary Care Plan Details Patient Name: [name] Date of Service: 07/02/2019 11:00 AM Medical Record Number: [id number] Patient Account Number: [id number] Date of Birth/Sex: 12-29-1945 (73 y.o. M) Treating RN: Army Melia Primary Care Mitchelle Sultan: [name] Other Clinician: Referring Erianna Jolly: [name] Treating Zharia Conrow/Extender: Melburn Hake, HOYT Weeks in Treatment: 20 Active Inactive Pressure Nursing Diagnoses: Knowledge deficit related to management of pressures ulcers Goals: Patient will remain free from development of  additional pressure ulcers Date Initiated: 04/30/2019 Target Resolution Date: 05/14/2019 Goal Status: Active Patient/caregiver will verbalize understanding of pressure ulcer management Date Initiated: 04/30/2019 Target Resolution Date: 05/14/2019 Goal Status: Active Interventions: Assess: immobility, friction, shearing, incontinence upon admission and as needed Treatment Activities: Patient referred for pressure reduction/relief devices : 04/30/2019 Notes: Wound/Skin Impairment Nursing Diagnoses: Impaired tissue integrity Goals: Ulcer/skin breakdown will have a volume reduction of 30% by week 4 Date Initiated: 04/30/2019 Target Resolution Date: 05/31/2019 Goal Status: Active Interventions: Assess patient/caregiver ability to obtain necessary supplies Assess ulceration(s) every visit Treatment Activities: Referred to DME Sekai Gitlin for dressing supplies : 04/30/2019 Skin care regimen initiated : 04/30/2019 Topical wound management initiated : 04/30/2019 Notes: Electronic Signature(s) Signed: 07/02/2019 4:14:01 PM By: Army Melia Entered By: Army Melia on 07/02/2019 11:18:40 [name] ([id number]) -------------------------------------------------------------------------------- Pain Assessment Details Patient Name: [name] Date of Service: 07/02/2019 11:00 AM Medical Record Number: [id number] Patient Account Number: [id number] Date of Birth/Sex: [birth year] (73 y.o. M) Treating RN: Montey Hora Primary Care Alfons Sulkowski: [name] Other Clinician: Referring Deklyn Gibbon: [name] Treating Jenie Parish/Extender: Melburn Hake, HOYT Weeks in Treatment: 20 Active Problems Location of Pain Severity and Description of Pain Patient Has Paino No Site Locations Pain Management and Medication Current Pain Management: Electronic Signature(s) Signed: 07/02/2019 4:17:09 PM By: Montey Hora Entered By: Montey Hora on 07/02/2019 11:08:19 [name]  ([id number]) -------------------------------------------------------------------------------- Patient/Caregiver Education Details Patient Name: [name] Date of Service: 07/02/2019 11:00 AM Medical Record Number: [id number] Patient Account Number: [id number] Date of Birth/Gender: 10-17-1945 (73 y.o. M) Treating RN: Army Melia Primary Care Physician: [name] Other Clinician: Referring Physician: [name] Treating Physician/Extender: Sharalyn Ink in Treatment: 20 Education Assessment Education Provided To: Patient Education Topics Provided Wound/Skin Impairment: Handouts: Caring for Your Ulcer Methods: Demonstration, Explain/Verbal Responses: State content correctly Electronic Signature(s) Signed: 07/02/2019 4:14:01 PM By: Army Melia Entered By: Army Melia on 07/02/2019 11:23:02 [name] ([id number]) -------------------------------------------------------------------------------- Wound Assessment Details Patient Name: [name] Date of Service: 07/02/2019 11:00 AM Medical Record Number: [id number] Patient Account Number: [id number] Date of Birth/Sex: [birth year] (73 y.o. M) Treating RN: Montey Hora Primary Care [place] Maulden: [name] Other Clinician: Referring Montavious Wierzba: [name] Treating Pairlee Sawtell/Extender: [name], HOYT Weeks in Treatment: 20 Wound Status Wound Number: 5 Primary Diabetic Wound/Ulcer of the Lower Extremity Etiology: Wound Location: Left Foot - Lateral Wound Open Wounding Event: Pressure Injury Status: Date Acquired: 01/19/2019 Comorbid Cataracts, Middle ear problems, Sleep Apnea, Weeks Of Treatment: 20 History: Hypertension, Type II Diabetes, Gout, Osteoarthritis, Clustered Wound: No Neuropathy Photos Wound Measurements  Length: (cm) 0.8 % Reduc Width: (cm) 1 % Reduc Depth: (cm) 0.1 Epithel Area: (cm) 0.628 Tunnel Volume: (cm) 0.063 Underm tion in Area: -128.4% tion in Volume:  23.2% ialization: Medium (34-66%) ing: No ining: No Wound Description Classification: Grade 2 Foul O Wound Margin: Flat and Intact Slough Exudate Amount: Small Exudate Type: Serosanguineous Exudate Color: red, brown dor After Cleansing: No /Fibrino No Wound Bed Granulation Amount: Large (67-100%) Exposed Structure Granulation Quality: Pink Fascia Exposed: No Necrotic Amount: None Present (0%) Fat Layer (Subcutaneous Tissue) Exposed: Yes Tendon Exposed: No Muscle Exposed: No Joint Exposed: No Bone Exposed: No Treatment Notes Wound #5 (Left, Lateral Foot) Notes Silver AG, telfa Electronic Signature(s) EZARIAH, NACE ([id number]) Signed: 07/02/2019 4:17:09 PM By: Montey Hora Entered By: Montey Hora on 07/02/2019 11:10:52 [name] ([id number]) -------------------------------------------------------------------------------- Vitals Details Patient Name: [name] Date of Service: 07/02/2019 11:00 AM Medical Record Number: [id number] Patient Account Number: [id number] Date of Birth/Sex: 01-17-1946 (73 y.o. M) Treating RN: Montey Hora Primary Care Ashdon Gillson: [name] Other Clinician: Referring Broady Lafoy: [name] Treating Zenobia Kuennen/Extender: [name], HOYT Weeks in Treatment: 20 Vital Signs Time Taken: 11:07 Temperature (F): 98.2 Height (in): 71 Pulse (bpm): 115 Weight (lbs): 205 Respiratory Rate (breaths/min): 16 Body Mass Index (BMI): 28.6 Blood Pressure (mmHg): 160/80 Reference Range: 80 - 120 mg / dl Electronic Signature(s) Signed: 07/02/2019 4:17:09 PM By: Montey Hora Entered By: Montey Hora on 07/02/2019 11:08:12

## 2019-07-02 NOTE — Progress Notes (Signed)
[name], [name] ([id number]) Visit Report for 07/02/2019 Chief Complaint Document Details Patient Name: DEMARQUES, PILZ Date of Service: 07/02/2019 11:00 AM Medical Record Number: [id number] Patient Account Number: [id number] Date of Birth/Sex: [birth year] (73 y.o. M) Treating RN: Army Melia Primary Care Provider: [name] Other Clinician: Referring Provider: [name] Treating Provider/Extender: Melburn Hake, Zakira Ressel Weeks in Treatment: 20 Information Obtained from: Patient Chief Complaint Left dorsal foot ulcer 02/09/2019; patient is here for review of the wound on the left lateral foot roughly at the level of the base of the fifth metatarsal Electronic Signature(s) Signed: 07/02/2019 1:39:53 PM By: Worthy Keeler [name] Entered By: Worthy Keeler on 07/02/2019 13:39:52 [name] ([id number]) -------------------------------------------------------------------------------- Debridement Details Patient Name: [name] Date of Service: 07/02/2019 11:00 AM Medical Record Number: [id number] Patient Account Number: [id number] Date of Birth/Sex: 01-17-1946 (73 y.o. M) Treating RN: Army Melia Primary Care Provider: [name] Other Clinician: Referring Provider: [name] Treating Provider/Extender: Melburn Hake, Cole Eastridge Weeks in Treatment: 20 Debridement Performed for Wound #5 Left,Lateral Foot Assessment: Performed By: Physician [name], Rosalind Guido E., [name] Debridement Type: Debridement Severity of Tissue Pre Debridement: Fat layer exposed Level of Consciousness (Pre- Awake and Alert procedure): Pre-procedure Verification/Time Out Yes - 11:20 Taken: Start Time: 11:21 Pain Control: Lidocaine Total Area Debrided (L x W): 0.8 (cm) x 1 (cm) = 0.8 (cm) Tissue and other material debrided: Viable, Non-Viable, Callus, Slough, Subcutaneous, Slough Level: Skin/Subcutaneous Tissue Debridement Description: Excisional Instrument: Curette Bleeding: None End  Time: 11:22 Response to Treatment: Procedure was tolerated well Level of Consciousness (Post- Awake and Alert procedure): Post Debridement Measurements of Total Wound Length: (cm) 0.8 Width: (cm) 1 Depth: (cm) 0.2 Volume: (cm) 0.126 Character of Wound/Ulcer Post Debridement: Stable Severity of Tissue Post Debridement: Fat layer exposed Post Procedure Diagnosis Same as Pre-procedure Electronic Signature(s) Signed: 07/02/2019 4:14:01 PM By: Army Melia Signed: 07/02/2019 4:32:11 PM By: Worthy Keeler [name] Entered By: Army Melia on 07/02/2019 11:21:54 [name] ([id number]) -------------------------------------------------------------------------------- HPI Details Patient Name: [name] Date of Service: 07/02/2019 11:00 AM Medical Record Number: [id number] Patient Account Number: [id number] Date of Birth/Sex: May 31, 1945 (73 y.o. M) Treating RN: Army Melia Primary Care Provider: [name] Other Clinician: Referring Provider: [name] Treating Provider/Extender: Melburn Hake, Mumin Denomme Weeks in Treatment: 20 History of Present Illness HPI Description: 10/04/16 on evaluation today patient presents for initial evaluation concerning a laceration of the right wrist and hand on the bowler aspect. He tells me that he fell into glass following a medication change in regard to his diabetes. He initially went to [facility] where she was sutured and subsequently his daughter who is a Marine scientist removed the teachers at the appropriate time. Unfortunately the wound has done poorly and dehissed which has caused him some trouble including infection. He has been on antibiotics both topically and orally though the wound continues to show signs of necrotic tissue according to notes which is what he was sent to [place] for evaluation concerning. He does have diabetes and unfortunately this is severely uncontrolled. He does see an endocrinologist and is working on this. 10/11/2016 --  the patient's notes from the ER were reviewed and he had his suturing done on 09/17/2016 and had necrotic skin flaps which had to be trimmed the last visit he was here. He has managed to get his Santyl ointment and has been using this appropriately. 10/21/16 patient's wound appears to be doing very well on evaluation today. He still has some  Slough covering the wound bed but this is nowhere near as severe as when i saw this initially two weeks ago nor even my review of his pictures last week. I'm pleased with how this is progressing. Patient's wound over the right form appears to be doing fairly well on evaluation today. There is no evidence of infection and he has a small area at the crease of where she is wrist extends and flexes that is still open and appears to be having difficulty due to the fact that it is cracking open. I think this is something that may be controlled with moisture control that is keeping the wound a little bit more moist. Nonetheless this is a tiny region compared to the initial wound and overall appears to be doing very well. No fevers, chills, nausea, or vomiting noted at this time. 11/25/16 on evaluation today patient appears to be doing well in regard to his right wrist ulcer. In fact this appears to be completely healed he is having no discomfort. He is pleased with how this is progressed. Readmission: 06/24/17 on evaluation today patient appears back in our clinic regarding issues that he has been having with his right ankle on the lateral malleolus as well is in the dorsal surface of his left foot. Both have been going on for several weeks unfortunately. He tells me that initially this was being managed by Dr. Edilia Bo and I did review the notes from Dr. Edilia Bo in epic. Patient was placed on amoxicillin initially and then subsequently Keflex following. He has been using triple antibiotic ointment over-the-counter along with a Band-Aid for the wound currently. He did not  have any Santyl remaining. His most recent hemoglobin A1c which was just two days ago was 10.3. Fortunately he seems to have excellent blood flow in the bilateral lower extremities with his left ABI being 0.95 in the right ABI 1.01. Patient does have some pain in regard to each of these ulcerations but he tells me it's not nearly as severe as the hand wound that I help treat earlier over the summer. This was in 2018. 07/15/17 on evaluation today patient's lower surety ulcer is actually both appear to be doing fairly well at this point. He does have a little bit of skin overlapping the ulcer on the right lateral ankle this was debrided away today just with saline and gauze without complication patient had no discomfort or pain. Otherwise patient's left foot ulcer does appear to be showing signs of improvement he does seem to have a right great toe. Nikki and noted at this point in time. I think that this does need to be managed at this point. No fevers, chills, nausea, or vomiting noted at this time. 07/22/17 on evaluation today patient's wounds in regard to his lower extremities appear to be doing somewhat better. His right ankle wound in fact appears to likely be completely closed. The left dorsal foot ulcer though still open does appear to be a little bit smaller and does also seem to be making good progress. He has not been having problems with the treatment at this point in the general he does not seem to show as much erythema surrounding the wound bed which is good news. He is not having significant discomfort. 08/12/17 on evaluation today patients wounds of both appeared to be doing better in fact they both appear to be completely healed. Overall I'm very pleased with how things have progressed over the past week even more than what I  expected. Obviously this is good news and he is happy about this. He did have a fall when going to his orthopedic doctor recently fortunately he does not appear to have  injured anything severely but nonetheless does have a couple of bumps and bruises nothing significant at this time but he needs to be seen and wound care for. He may have broken his left wrist he is following up with orthopedics in that regard. Readmission: 10/24/17 on evaluation today patient presents for reevaluation after having been discharged Aug 12, 2017. His wound was healed at that point although he states that it has been somewhat discolored since that time. He has not experienced the drainage as far as the wound is concerned but the fact that it was still discolored been greater than two months after the fact made him want to come in for reevaluation. Upon inspection today the patient has no pain although he does have neuropathy. He does have a discolored region of the dorsal surface of the left foot which was the site where the ulcer was that we previously treated. The good news is he does not again have any discomfort although obviously I do believe this may be some scar tissue and just discoloration in that regard. Again he hasn't had any drainage or discharge since I last saw him and at this point I see no evidence of that either this area shows complete epithelialization. In general I do not feel like there's any significant issue at this point in that regard. NAKHI, CHOI ([id number]) 02/09/2019 This is a 74 year old man with type 2 diabetes poorly controlled with significant peripheral neuropathy. He has been in this clinic before in 2018 with a laceration on his right hand and then again in 2019 with an area over the right lateral malleolus. 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 He states that the wound is been there for the last week or 2. It is been uncomfortable to walk on. The man is an active man having to look after an elderly mother. Past medical history, type 2 diabetes  with polyneuropathy with a recent hemoglobin A1c in September 2011 0.3, lumbar disc disease, hypergonadism., Left foot drop and gout. We did not repeat his ABIs in the clinic quoting values from July 2019 of 0.95 and 1.01. He is not felt to have significant PAD 02/19/2019 on evaluation today patient actually appears to be doing better with regard to his foot wound based on what I am seeing currently. He was seen by Dr. Dellia Nims on readmission back into the clinic last week when I was off. The good news is the patient has been tolerating the dressing changes without any complication in fact has not really had much in the way of drainage. He does have an AFO brace which she believes is actually pushing on this area as well and has contributed to the issue. Fortunately there is no signs of active infection at this point. His ABIs appear to be well. 03/05/2019 on evaluation today patient appears to be doing well in regard to his foot ulcer. He has been tolerating the dressing changes without complication. Fortunately there is no signs of active infection. The wound is measuring somewhat smaller and overall I feel like he is making good progress at this point. 03/12/2019 on evaluation today patient actually appears to be doing quite well with regard to his  foot ulcer. The wound is looking better the measurement is not significantly better at this point unfortunately but nonetheless again the overall appearance is improving I think he is making some progress. I still think the big issue here is foot drop and again the patient wants to see if potentially he could get a brace to help with this. Subsequently I think that we can definitely make a referral to [facility] to see if they can help [place] in this regard. He is in agreement with that plan. 03/26/2019 on evaluation today patient appears to be doing more poorly in regard to his foot ulcer at this time. He did see podiatry in order to see about getting a brace  but unfortunately he tells me that Medicare is not likely to pay for one for him as he had 1 I guess 8 months ago. However this one that apparently goes in his shoes that does not seem to be working out well for him. Unfortunately there are signs of active infection at this time. No fevers, chills, nausea, vomiting, or diarrhea. Systemically. The patient has erythema on the dorsal surface of his foot that tracks from the lateral portion of his foot and again he does have a blister around the wound as well which is worse compared to previous. Overall I am concerned. 04/02/2019 on evaluation today patient appears to be doing about the same with regard to his wound. Unfortunately there is no signs of significant improvement although I do think there is no signs of infection which is good news. His drainage is also slowed down tremendously. I think at this point he would benefit from the initiation of a total contact cast. The only concern I have is that over his balance. I explained to the patient that if we were to initiate the cast I think it would be greatly beneficial for him but he would need to ensure not to walk at all unless he uses a walker he actually has 2 walkers one for inside his home and one that he keeps out in the car. For that reason he would be covered in that regard. I just do not want him to have any accidents and fall with the cast. 12/28-Patient comes in 1 week out a day early for his appointment he was in the hospital for 2 days for uncontrolled hyperglycemia and required management of high blood sugars, with some adjustments in his home regimen of insulin, patient stubbed his right great toe today and put a Band-Aid on it, he is here for his left lateral foot ulcer and we are using silver alginate dressing, he was intact total contact cast with difficulty with balance to the extent that that had to be discontinued 04/16/2019 on evaluation today patient appears to be doing better  with regard to the overall appearance of his wound size. This is good news. We had attempted a total contact cast but unfortunately he had some falling issues that the family contacted [place] about we took the cast off we did not put it back on. Nonetheless it ended up that just a couple days later he was in the hospital due to elevations in his blood sugar which went to around 500 and even a little higher. Subsequently he was admitted to the hospital and was in the hospital for a total of 3 days and that included Christmas day. Fortunately there got his blood sugar under control he tells me that this is running about 200 now which  is much better news. There does not appear to be any signs of active infection at this time. No fevers, chills, nausea, vomiting, or diarrhea. 04/30/2019 on evaluation today patient actually appears to be doing slightly worse compared to previous as far as the overall size of his wound is concerned. There also is some need for sharp debridement today as well. Fortunately there is no evidence of active infection which is good news. No fevers, chills, nausea, vomiting, or diarrhea. 05/07/2019 upon evaluation today patient appears to be doing excellent in regard to his plantar foot ulcer. He has been tolerating the dressing changes without complication. Fortunately there is no signs of active infection at this time. No fevers, chills, nausea, vomiting, or diarrhea. He does tell me that he actually got in touch with [employer] and they feel like they can add diagnosis codes to his orders in order to get an external brace for him as opposed to the one that goes internal to issue. That would be excellent for his foot drop and I did tell him that if there is anything I need to fill out in that regard I will be more than happy to oblige and fill out what is needed for [employer]. 05/14/2019 upon evaluation today patient's original wound site actually appears to be doing much better which is good  news. Fortunately there is no signs of active infection at this time. No fevers, chills, nausea, vomiting, or diarrhea. With that being said the patient unfortunately is continuing to have issues with rubbing he tells me that the felt slid on his foot although he did not realize it until today. Nonetheless I think this did cause a blister around the edge of the wound and he does an open wound that is slightly larger overall in measurement compared to last time mainly due to this blister. Other than that I feel like he is doing quite well. 05/21/2019 upon evaluation today patient appears to be doing excellent in regard to his wound at this time on the plantar foot. He is showing signs of improvement he does have some slough and biofilm noted on the surface of the wound this is going require some sharp debridement today as well. Fortunately there is no evidence of active infection. No fevers, chills, nausea, vomiting, or diarrhea. 05/28/2019 on evaluation today patient's wound actually appears to be measuring smaller upon initial inspection and I do believe that it is doing well. YIANNI, SKILLING ([id number]) With that being said he continues to have a blister on the outside/lateral edge of the wound that occurs as a result of rubbing due to his dropfoot currently. Subsequently we have filled out the paperwork for his new external brace but again that does take several weeks to get that completed once everything is returned to back we did send this back last week as far as the paperwork was concerned. With that being said there does not appear to be any signs of active infection at this time. 06/04/2019 on evaluation today patient's wound actually appears to be doing slightly better compared to last week which is good news. He still states that his insurance unfortunately does not seem to want to cover any of his new brace. He is going to just pay for this himself. Nonetheless he is going by there today  when he leaves here that is [employer] in [place] that is going to be making this brace for him. 06/18/2019 upon evaluation today patient fortunately does have his new brace which  is the external AFO brace with bilateral uprights. Subsequently he seems to be doing much better in fact the wound already appears better. He has not even been using the felt he is just using a dressing over top of the wound and overall I am very pleased with how things seem to be progressing. There is no signs of active infection at this time which is great news and I think that the AFO is good to help him tremendously as far as trying to keep pressure off of the 06/25/2019 upon evaluation today patient seems to be making good progress in regard to his wound. He has been tolerating the dressing changes without complication. Fortunately there is no signs of active infection at this time. No fevers, chills, nausea, vomiting, or diarrhea. 07/02/2019 upon evaluation today patient unfortunately had a blistered area on the bottom of his foot tracking more towards the midline of the plantar aspect. He does not seem to have issues with rubbing more laterally that he used to have but nonetheless this is still giving him some trouble and causing problems here. Fortunately there is no signs of active infection at this time. No fevers, chills, nausea, vomiting, or diarrhea. Electronic Signature(s) Signed: 07/02/2019 1:48:59 PM By: Worthy Keeler [name] Previous Signature: 07/02/2019 1:39:55 PM Version By: Worthy Keeler [name] Entered By: Worthy Keeler on 07/02/2019 13:48:58 [name] ([id number]) -------------------------------------------------------------------------------- Physical Exam Details Patient Name: [name] Date of Service: 07/02/2019 11:00 AM Medical Record Number: [id number] Patient Account Number: [id number] Date of Birth/Sex: January 29, 1946 (73 y.o. M) Treating RN: Army Melia Primary Care Provider:  [name] Other Clinician: Referring Provider: [name] Treating Provider/Extender: [name], Blain Hunsucker Weeks in Treatment: 104 Constitutional Well-nourished and well-hydrated in no acute distress. Respiratory normal breathing without difficulty. Psychiatric this patient is able to make decisions and demonstrates good insight into disease process. Alert and Oriented x 3. pleasant and cooperative. Notes Patient's wound bed currently showed signs of in general some slough buildup on the surface of the wound which is can require sharp debridement today. Fortunately there is no signs of active infection which is good news. Sharp debridement was performed to clear away some of the skin that was somewhat macerated and overlapping the wound underlying this region. I did perform the debridement today without complication post debridement the wound bed appears to be doing much better. Electronic Signature(s) Signed: 07/02/2019 1:49:33 PM By: Worthy Keeler [name] Entered By: Worthy Keeler on 07/02/2019 13:49:32 [name] ([id number]) -------------------------------------------------------------------------------- Physician Orders Details Patient Name: [name] Date of Service: 07/02/2019 11:00 AM Medical Record Number: [id number] Patient Account Number: [id number] Date of Birth/Sex: 1945-05-01 (73 y.o. M) Treating RN: Army Melia Primary Care Provider: [name] Other Clinician: Referring Provider: [name] Treating Provider/Extender: Melburn Hake, Donnavin Vandenbrink Weeks in Treatment: 20 Verbal / Phone Orders: No Diagnosis Coding Wound Cleansing Wound #5 Left,Lateral Foot o May shower with protection. Anesthetic (add to Medication List) Wound #5 Left,Lateral Foot o Topical Lidocaine 4% cream applied to wound bed prior to debridement (In Clinic Only). Primary Wound Dressing Wound #5 Left,Lateral Foot o Silver Alginate Secondary Dressing Wound #5  [place] Dressing Change Frequency Wound #5 Left,Lateral Foot o Change dressing every other day. Follow-up Appointments Wound #5 Left,Lateral Foot o Return Appointment in 1 week. Edema Control Wound #5 Left,Lateral Foot o Elevate legs to the level of the heart and pump ankles as often as possible Additional Orders / Instructions  Wound #5 Left,Lateral Foot o Other: - Patient to follow-up with [employer] for adjustments to braces and shoes. Requested paperwork sent to [employer]. Electronic Signature(s) Signed: 07/02/2019 4:14:01 PM By: Army Melia Signed: 07/02/2019 4:32:11 PM By: Worthy Keeler [name] Entered By: Army Melia on 07/02/2019 11:22:50 [name] ([id number]) -------------------------------------------------------------------------------- Problem List Details Patient Name: [name] Date of Service: 07/02/2019 11:00 AM Medical Record Number: [id number] Patient Account Number: [id number] Date of Birth/Sex: 1946/02/06 (73 y.o. M) Treating RN: Army Melia Primary Care Provider: [name] Other Clinician: Referring Provider: [name] Treating Provider/Extender: Melburn Hake, Maddisyn Hegwood Weeks in Treatment: 20 Active Problems ICD-10 Evaluated Encounter Code Description Active Date Today Diagnosis E11.621 Type 2 diabetes mellitus with foot ulcer 02/09/2019 No Yes E11.42 Type 2 diabetes mellitus with diabetic polyneuropathy 02/09/2019 No Yes L97.522 Non-pressure chronic ulcer of other part of left foot with fat layer exposed 02/09/2019 No Yes M21.372 Foot drop, left foot 02/09/2019 No Yes Inactive Problems Resolved Problems Electronic Signature(s) Signed: 07/02/2019 1:39:45 PM By: Worthy Keeler [name] Entered By: Worthy Keeler on 07/02/2019 13:39:45 [name] ([id number]) -------------------------------------------------------------------------------- Progress Note Details Patient Name: [name] Date  of Service: 07/02/2019 11:00 AM Medical Record Number: [id number] Patient Account Number: [id number] Date of Birth/Sex: December 06, 1945 (73 y.o. M) Treating RN: Army Melia Primary Care Provider: [name] Other Clinician: Referring Provider: [name] Treating Provider/Extender: Melburn Hake, Bonner Larue Weeks in Treatment: 20 Subjective Chief Complaint Information obtained from Patient Left dorsal foot ulcer 02/09/2019; patient is here for review of the wound on the left lateral foot roughly at the level of the base of the fifth metatarsal History of Present Illness (HPI) 10/04/16 on evaluation today patient presents for initial evaluation concerning a laceration of the right wrist and hand on the bowler aspect. He tells me that he fell into glass following a medication change in regard to his diabetes. He initially went to [facility] where she was sutured and subsequently his daughter who is a Marine scientist removed the teachers at the appropriate time. Unfortunately the wound has done poorly and dehissed which has caused him some trouble including infection. He has been on antibiotics both topically and orally though the wound continues to show signs of necrotic tissue according to notes which is what he was sent to [place] for evaluation concerning. He does have diabetes and unfortunately this is severely uncontrolled. He does see an endocrinologist and is working on this. 10/11/2016 -- the patient's notes from the ER were reviewed and he had his suturing done on 09/17/2016 and had necrotic skin flaps which had to be trimmed the last visit he was here. He has managed to get his Santyl ointment and has been using this appropriately. 10/21/16 patient's wound appears to be doing very well on evaluation today. He still has some Slough covering the wound bed but this is nowhere near as severe as when i saw this initially two weeks ago nor even my review of his pictures last week. I'm pleased with how this is  progressing. Patient's wound over the right form appears to be doing fairly well on evaluation today. There is no evidence of infection and he has a small area at the crease of where she is wrist extends and flexes that is still open and appears to be having difficulty due to the fact that it is cracking open. I think this is something that may be controlled with moisture control that is keeping the wound a little bit  more moist. Nonetheless this is a tiny region compared to the initial wound and overall appears to be doing very well. No fevers, chills, nausea, or vomiting noted at this time. 11/25/16 on evaluation today patient appears to be doing well in regard to his right wrist ulcer. In fact this appears to be completely healed he is having no discomfort. He is pleased with how this is progressed. Readmission: 06/24/17 on evaluation today patient appears back in our clinic regarding issues that he has been having with his right ankle on the lateral malleolus as well is in the dorsal surface of his left foot. Both have been going on for several weeks unfortunately. He tells me that initially this was being managed by Dr. Edilia Bo and I did review the notes from Dr. Edilia Bo in epic. Patient was placed on amoxicillin initially and then subsequently Keflex following. He has been using triple antibiotic ointment over-the-counter along with a Band-Aid for the wound currently. He did not have any Santyl remaining. His most recent hemoglobin A1c which was just two days ago was 10.3. Fortunately he seems to have excellent blood flow in the bilateral lower extremities with his left ABI being 0.95 in the right ABI 1.01. Patient does have some pain in regard to each of these ulcerations but he tells me it's not nearly as severe as the hand wound that I help treat earlier over the summer. This was in 2018. 07/15/17 on evaluation today patient's lower surety ulcer is actually both appear to be doing fairly well  at this point. He does have a little bit of skin overlapping the ulcer on the right lateral ankle this was debrided away today just with saline and gauze without complication patient had no discomfort or pain. Otherwise patient's left foot ulcer does appear to be showing signs of improvement he does seem to have a right great toe. Nikki and noted at this point in time. I think that this does need to be managed at this point. No fevers, chills, nausea, or vomiting noted at this time. 07/22/17 on evaluation today patient's wounds in regard to his lower extremities appear to be doing somewhat better. His right ankle wound in fact appears to likely be completely closed. The left dorsal foot ulcer though still open does appear to be a little bit smaller and does also seem to be making good progress. He has not been having problems with the treatment at this point in the general he does not seem to show as much erythema surrounding the wound bed which is good news. He is not having significant discomfort. 08/12/17 on evaluation today patients wounds of both appeared to be doing better in fact they both appear to be completely healed. Overall I'm very pleased with how things have progressed over the past week even more than what I expected. Obviously this is good news and he is happy about this. He did have a fall when going to his orthopedic doctor recently fortunately he does not appear to have injured anything severely but nonetheless does have a couple of bumps and bruises nothing significant at this time but he needs to be seen and wound care for. He may have broken his left wrist he is following up with orthopedics in that regard. Readmission: 10/24/17 on evaluation today patient presents for reevaluation after having been discharged Aug 12, 2017. His wound was healed at that point although he states that it has been somewhat discolored since that time. He has not experienced  the drainage as far as the  wound is concerned but the fact that it was still discolored been greater than two months after the fact made him want to come in for reevaluation. Upon inspection today the patient has no pain although he does have neuropathy. He does have a discolored region of the dorsal surface of the left foot which was the site where the ulcer was [name], [name]. ([id number]) that we previously treated. The good news is he does not again have any discomfort although obviously I do believe this may be some scar tissue and just discoloration in that regard. Again he hasn't had any drainage or discharge since I last saw him and at this point I see no evidence of that either this area shows complete epithelialization. In general I do not feel like there's any significant issue at this point in that regard. 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 He has been in this clinic before in 2018 with a laceration on his right hand and then again in 2019 with an area over the right lateral malleolus. 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 He states that the wound is been there for the last week or 2. It is been uncomfortable to walk on. The man is an active man having to look after an elderly mother. Past medical history, type 2 diabetes with polyneuropathy with a recent hemoglobin A1c in September 2011 0.3, lumbar disc disease, hypergonadism., Left foot drop and gout. We did not repeat his ABIs in the clinic quoting values from July 2019 of 0.95 and 1.01. He is not felt to have significant PAD 02/19/2019 on evaluation today patient actually appears to be doing better with regard to his foot wound based on what I am seeing currently. He was seen by Dr. Dellia Nims on readmission back into the clinic last week when I was off. The good news is the patient has  been tolerating the dressing changes without any complication in fact has not really had much in the way of drainage. He does have an AFO brace which she believes is actually pushing on this area as well and has contributed to the issue. Fortunately there is no signs of active infection at this point. His ABIs appear to be well. 03/05/2019 on evaluation today patient appears to be doing well in regard to his foot ulcer. He has been tolerating the dressing changes without complication. Fortunately there is no signs of active infection. The wound is measuring somewhat smaller and overall I feel like he is making good progress at this point. 03/12/2019 on evaluation today patient actually appears to be doing quite well with regard to his foot ulcer. The wound is looking better the measurement is not significantly better at this point unfortunately but nonetheless again the overall appearance is improving I think he is making some progress. I still think the big issue here is foot drop and again the patient wants to see if potentially he could get a brace to help with this. Subsequently I think that we can definitely make a referral to [facility] to see if they can help [place] in this regard. He is in agreement with that plan. 03/26/2019 on evaluation today patient appears to be doing more poorly in regard to his foot ulcer at this time. He did see podiatry  in order to see about getting a brace but unfortunately he tells me that Medicare is not likely to pay for one for him as he had 1 I guess 8 months ago. However this one that apparently goes in his shoes that does not seem to be working out well for him. Unfortunately there are signs of active infection at this time. No fevers, chills, nausea, vomiting, or diarrhea. Systemically. The patient has erythema on the dorsal surface of his foot that tracks from the lateral portion of his foot and again he does have a blister around the wound as well which is worse  compared to previous. Overall I am concerned. 04/02/2019 on evaluation today patient appears to be doing about the same with regard to his wound. Unfortunately there is no signs of significant improvement although I do think there is no signs of infection which is good news. His drainage is also slowed down tremendously. I think at this point he would benefit from the initiation of a total contact cast. The only concern I have is that over his balance. I explained to the patient that if we were to initiate the cast I think it would be greatly beneficial for him but he would need to ensure not to walk at all unless he uses a walker he actually has 2 walkers one for inside his home and one that he keeps out in the car. For that reason he would be covered in that regard. I just do not want him to have any accidents and fall with the cast. 12/28-Patient comes in 1 week out a day early for his appointment he was in the hospital for 2 days for uncontrolled hyperglycemia and required management of high blood sugars, with some adjustments in his home regimen of insulin, patient stubbed his right great toe today and put a Band-Aid on it, he is here for his left lateral foot ulcer and we are using silver alginate dressing, he was intact total contact cast with difficulty with balance to the extent that that had to be discontinued 04/16/2019 on evaluation today patient appears to be doing better with regard to the overall appearance of his wound size. This is good news. We had attempted a total contact cast but unfortunately he had some falling issues that the family contacted [place] about we took the cast off we did not put it back on. Nonetheless it ended up that just a couple days later he was in the hospital due to elevations in his blood sugar which went to around 500 and even a little higher. Subsequently he was admitted to the hospital and was in the hospital for a total of 3 days and that included Christmas  day. Fortunately there got his blood sugar under control he tells me that this is running about 200 now which is much better news. There does not appear to be any signs of active infection at this time. No fevers, chills, nausea, vomiting, or diarrhea. 04/30/2019 on evaluation today patient actually appears to be doing slightly worse compared to previous as far as the overall size of his wound is concerned. There also is some need for sharp debridement today as well. Fortunately there is no evidence of active infection which is good news. No fevers, chills, nausea, vomiting, or diarrhea. 05/07/2019 upon evaluation today patient appears to be doing excellent in regard to his plantar foot ulcer. He has been tolerating the dressing changes without complication. Fortunately there is no signs of active  infection at this time. No fevers, chills, nausea, vomiting, or diarrhea. He does tell me that he actually got in touch with [employer] and they feel like they can add diagnosis codes to his orders in order to get an external brace for him as opposed to the one that goes internal to issue. That would be excellent for his foot drop and I did tell him that if there is anything I need to fill out in that regard I will be more than happy to oblige and fill out what is needed for [employer]. 05/14/2019 upon evaluation today patient's original wound site actually appears to be doing much better which is good news. Fortunately there is no signs of active infection at this time. No fevers, chills, nausea, vomiting, or diarrhea. With that being said the patient unfortunately is continuing to have issues with rubbing he tells me that the felt slid on his foot although he did not realize it until today. Nonetheless I think this did cause a blister around the edge of the wound and he does an open wound that is slightly larger overall in measurement compared to last time mainly due to this blister. Other than that I feel like he  is doing quite well. [name], [name] ([id number]) 05/21/2019 upon evaluation today patient appears to be doing excellent in regard to his wound at this time on the plantar foot. He is showing signs of improvement he does have some slough and biofilm noted on the surface of the wound this is going require some sharp debridement today as well. Fortunately there is no evidence of active infection. No fevers, chills, nausea, vomiting, or diarrhea. 05/28/2019 on evaluation today patient's wound actually appears to be measuring smaller upon initial inspection and I do believe that it is doing well. With that being said he continues to have a blister on the outside/lateral edge of the wound that occurs as a result of rubbing due to his dropfoot currently. Subsequently we have filled out the paperwork for his new external brace but again that does take several weeks to get that completed once everything is returned to back we did send this back last week as far as the paperwork was concerned. With that being said there does not appear to be any signs of active infection at this time. 06/04/2019 on evaluation today patient's wound actually appears to be doing slightly better compared to last week which is good news. He still states that his insurance unfortunately does not seem to want to cover any of his new brace. He is going to just pay for this himself. Nonetheless he is going by there today when he leaves here that is [employer] in [place] that is going to be making this brace for him. 06/18/2019 upon evaluation today patient fortunately does have his new brace which is the external AFO brace with bilateral uprights. Subsequently he seems to be doing much better in fact the wound already appears better. He has not even been using the felt he is just using a dressing over top of the wound and overall I am very pleased with how things seem to be progressing. There is no signs of active infection at this time  which is great news and I think that the AFO is good to help him tremendously as far as trying to keep pressure off of the 06/25/2019 upon evaluation today patient seems to be making good progress in regard to his wound. He has been tolerating the dressing changes  without complication. Fortunately there is no signs of active infection at this time. No fevers, chills, nausea, vomiting, or diarrhea. 07/02/2019 upon evaluation today patient unfortunately had a blistered area on the bottom of his foot tracking more towards the midline of the plantar aspect. He does not seem to have issues with rubbing more laterally that he used to have but nonetheless this is still giving him some trouble and causing problems here. Fortunately there is no signs of active infection at this time. No fevers, chills, nausea, vomiting, or diarrhea. Objective Constitutional Well-nourished and well-hydrated in no acute distress. Vitals Time Taken: 11:07 AM, Height: 71 in, Weight: 205 lbs, BMI: 28.6, Temperature: 98.2 F, Pulse: 115 bpm, Respiratory Rate: 16 breaths/min, Blood Pressure: 160/80 mmHg. Respiratory normal breathing without difficulty. Psychiatric this patient is able to make decisions and demonstrates good insight into disease process. Alert and Oriented x 3. pleasant and cooperative. General Notes: Patient's wound bed currently showed signs of in general some slough buildup on the surface of the wound which is can require sharp debridement today. Fortunately there is no signs of active infection which is good news. Sharp debridement was performed to clear away some of the skin that was somewhat macerated and overlapping the wound underlying this region. I did perform the debridement today without complication post debridement the wound bed appears to be doing much better. Integumentary (Hair, Skin) Wound #5 status is Open. Original cause of wound was Pressure Injury. The wound is located on the Left,Lateral  Foot. The wound measures 0.8cm length x 1cm width x 0.1cm depth; 0.628cm^2 area and 0.063cm^3 volume. There is Fat Layer (Subcutaneous Tissue) Exposed exposed. There is no tunneling or undermining noted. There is a small amount of serosanguineous drainage noted. The wound margin is flat and intact. There is large (67- 100%) pink granulation within the wound bed. There is no necrotic tissue within the wound bed. Assessment Active Problems ICD-10 Type 2 diabetes mellitus with foot ulcer KACYN, SOUDER. 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 There was a Excisional Skin/Subcutaneous Tissue Debridement with a total area of 0.8 sq cm performed by [name], [name] E., [name]. With the following instrument(s): Curette to remove Viable and Non-Viable tissue/material. Material removed includes Callus, Subcutaneous Tissue, and Slough after achieving pain control using Lidocaine. A time out was conducted at 11:20, prior to the start of the procedure. There was no bleeding. The procedure was tolerated well. Post Debridement Measurements: 0.8cm length x 1cm width x 0.2cm depth; 0.126cm^3 volume. Character of Wound/Ulcer Post Debridement is stable. Severity of Tissue Post Debridement is: Fat layer exposed. Post procedure Diagnosis Wound #5: Same as Pre-Procedure Plan Wound Cleansing: Wound #5 Left,Lateral Foot: May shower with protection. Anesthetic (add to Medication List): Wound #5 Left,Lateral Foot: Topical Lidocaine 4% cream applied to wound bed prior to debridement (In Clinic Only). Primary Wound Dressing: Wound #5 Left,Lateral Foot: Silver Alginate Secondary Dressing: Wound #5  Left,Lateral Foot: [place] Dressing Change Frequency: Wound #5 Left,Lateral Foot: Change dressing every other day. Follow-up Appointments: Wound #5 Left,Lateral Foot: Return Appointment in 1 week. Edema Control: Wound #5 Left,Lateral Foot: Elevate legs to the level of the heart and pump ankles as often as possible Additional  Orders / Instructions: Wound #5 Left,Lateral Foot: Other: - Patient to follow-up with [employer] for adjustments to braces and shoes. Requested paperwork sent to [employer]. 1. My suggestion at this time is good to be that we going continue with the wound care measures currently specifically with the padding and the use of his external foot drop brace. I think he is doing well in that regard. 2. I am and I discontinued collagen and we will utilize an alginate dressing at this point. I think that may do better to help control moisture. 3. I am also can recommend the patient continue to try to limit as much as possible to walking he can do. Even with his new brace he still does not need to be overly active that can cause more detrimental changes in the wound bed as well. We will see patient back for reevaluation in 1 week here in the clinic. If anything worsens or changes patient will contact our office for additional recommendations. Electronic Signature(s) Signed: 07/02/2019 1:50:16 PM By: Waynard Reeds, Jai EMarland Kitchen ([id number]) Entered By: Worthy Keeler on 07/02/2019 13:50:16 NORWIN, [name] ([id number]) -------------------------------------------------------------------------------- SuperBill Details Patient Name: [name] Date of Service: 07/02/2019 Medical Record Number: [id number] Patient Account Number: [id number] Date of Birth/Sex: 08/03/1945 (73 y.o. M) Treating RN: Army Melia Primary Care Provider: [name] Other Clinician: Referring Provider: [name] Treating Provider/Extender: Melburn Hake, Umaiza Matusik Weeks in  Treatment: 20 Diagnosis Coding ICD-10 Codes Code Description E11.621 Type 2 diabetes mellitus with foot ulcer E11.42 Type 2 diabetes mellitus with diabetic polyneuropathy L97.522 Non-pressure chronic ulcer of other part of left foot with fat layer exposed M21.372 Foot drop, left foot Facility Procedures CPT4 Code: 08657846 Description: 96295 - DEB SUBQ TISSUE 20 SQ CM/< Modifier: Quantity: 1 CPT4 Code: Description: ICD-10 Diagnosis Description L97.522 Non-pressure chronic ulcer of other part of left foot with fat layer exposed Modifier: Quantity: Physician Procedures CPT4 Code: 2841324 Description: 11042 - WC PHYS SUBQ TISS 20 SQ CM Modifier: Quantity: 1 CPT4 Code: Description: ICD-10 Diagnosis Description L97.522 Non-pressure chronic ulcer of other part of left foot with fat layer exposed Modifier: Quantity: Electronic Signature(s) Signed: 07/02/2019 1:50:32 PM By: Worthy Keeler [name] Entered By: Worthy Keeler on 07/02/2019 13:50:31

## 2019-07-02 NOTE — Telephone Encounter (Signed)
[name] notified as instructed by telephone.  Patient states understanding.

## 2019-07-04 ENCOUNTER — Ambulatory Visit (INDEPENDENT_AMBULATORY_CARE_PROVIDER_SITE_OTHER): Payer: Medicare Other

## 2019-07-04 ENCOUNTER — Other Ambulatory Visit: Payer: Self-pay

## 2019-07-04 DIAGNOSIS — E291 Testicular hypofunction: Secondary | ICD-10-CM

## 2019-07-04 MED ORDER — TESTOSTERONE CYPIONATE 200 MG/ML IM SOLN
150.0000 mg | INTRAMUSCULAR | Status: DC
  Administered 2019-07-04: 150 mg via INTRAMUSCULAR

## 2019-07-04 MED ORDER — TESTOSTERONE CYPIONATE 100 MG/ML IM SOLN: 150.0000 mg | INTRAMUSCULAR | Status: DC

## 2019-07-04 NOTE — Progress Notes (Signed)
Per orders of Dr. Lorelei Pont, injection of 0.46mL  testosterone in the left upper outer quadrant given by Randall An. Placed wrong order so had to place another order.  Patient tolerated injection well.

## 2019-07-09 ENCOUNTER — Encounter: Payer: Medicare Other | Admitting: Physician Assistant

## 2019-07-09 ENCOUNTER — Other Ambulatory Visit: Payer: Self-pay

## 2019-07-09 DIAGNOSIS — I10 Essential (primary) hypertension: Secondary | ICD-10-CM | POA: Diagnosis not present

## 2019-07-09 DIAGNOSIS — E11621 Type 2 diabetes mellitus with foot ulcer: Secondary | ICD-10-CM | POA: Diagnosis not present

## 2019-07-09 DIAGNOSIS — L97522 Non-pressure chronic ulcer of other part of left foot with fat layer exposed: Secondary | ICD-10-CM | POA: Diagnosis not present

## 2019-07-09 DIAGNOSIS — G4730 Sleep apnea, unspecified: Secondary | ICD-10-CM | POA: Diagnosis not present

## 2019-07-09 DIAGNOSIS — M109 Gout, unspecified: Secondary | ICD-10-CM | POA: Diagnosis not present

## 2019-07-09 DIAGNOSIS — M1990 Unspecified osteoarthritis, unspecified site: Secondary | ICD-10-CM | POA: Diagnosis not present

## 2019-07-09 DIAGNOSIS — Z794 Long term (current) use of insulin: Secondary | ICD-10-CM | POA: Diagnosis not present

## 2019-07-09 DIAGNOSIS — M21372 Foot drop, left foot: Secondary | ICD-10-CM | POA: Diagnosis not present

## 2019-07-09 DIAGNOSIS — E1142 Type 2 diabetes mellitus with diabetic polyneuropathy: Secondary | ICD-10-CM | POA: Diagnosis not present

## 2019-07-09 NOTE — Progress Notes (Signed)
OGDEN, HANDLIN ([id number]) Visit Report for 07/09/2019 Arrival Information Details Patient Name: [name], [name] Date of Service: 07/09/2019 11:00 AM Medical Record Number: [id number] Patient Account Number: [id number] Date of Birth/Sex: 23-Jan-1946 (73 y.o. M) Treating RN: [name] Primary Care [name]: [name] Other Clinician: Referring [name]: [name] Treating [name]/Extender: [name], [name] Weeks in Treatment: 21 Visit Information History Since Last Visit Added or deleted any medications: No Patient Arrived: Ambulatory Any new allergies or adverse reactions: No Arrival Time: 11:09 Had a fall or experienced change in No Accompanied By: self activities of daily living that may affect Transfer Assistance: None risk of falls: Patient Identification Verified: Yes Signs or symptoms of abuse/neglect since last visito No Secondary Verification Process Completed: Yes Hospitalized since last visit: No Patient Has Alerts: Yes Implantable device outside of the clinic excluding No Patient Alerts: DMII cellular tissue based products placed in the center since last visit: Has Dressing in Place as Prescribed: Yes Has Footwear/Offloading in Place as Prescribed: Yes Left: Other:brace Pain Present Now: No Electronic Signature(s) Signed: 07/09/2019 4:17:26 PM By: [name] Entered By: [name] on 07/09/2019 11:10:41 [name] ([id number]) -------------------------------------------------------------------------------- Clinic Level of Care Assessment Details Patient Name: [name] Date of Service: 07/09/2019 11:00 AM Medical Record Number: [id number] Patient Account Number: [id number] Date of Birth/Sex: Mar 17, 1946 (73 y.o. M) Treating RN: [name] Primary Care [name]: [name] Other Clinician: Referring [name]: [name] Treating [name]/Extender: [name], [name] Weeks in Treatment: 21 Clinic Level of Care  Assessment Items TOOL 4 Quantity Score []  - Use when only an EandM is performed on FOLLOW-UP visit 0 ASSESSMENTS - Nursing Assessment / Reassessment X - Reassessment of Co-morbidities (includes updates in patient status) 1 10 X- 1 5 Reassessment of Adherence to Treatment Plan ASSESSMENTS - Wound and Skin Assessment / Reassessment X - Simple Wound Assessment / Reassessment - one wound 1 5 []  - 0 Complex Wound Assessment / Reassessment - multiple wounds []  - 0 Dermatologic / Skin Assessment (not related to wound area) ASSESSMENTS - Focused Assessment []  - Circumferential Edema Measurements - multi extremities 0 []  - 0 Nutritional Assessment / Counseling / Intervention []  - 0 Lower Extremity Assessment (monofilament, tuning fork, pulses) []  - 0 Peripheral Arterial Disease Assessment (using hand held doppler) ASSESSMENTS - Ostomy and/or Continence Assessment and Care []  - Incontinence Assessment and Management 0 []  - 0 Ostomy Care Assessment and Management (repouching, etc.) PROCESS - Coordination of Care X - Simple Patient / Family Education for ongoing care 1 15 []  - 0 Complex (extensive) Patient / Family Education for ongoing care []  - 0 Staff obtains Programmer, systems, Records, Test Results / Process Orders []  - 0 Staff telephones HHA, Nursing Homes / Clarify orders / etc []  - 0 Routine Transfer to another Facility (non-emergent condition) []  - 0 Routine Hospital Admission (non-emergent condition) []  - 0 New Admissions / Biomedical engineer / Ordering NPWT, Apligraf, etc. []  - 0 Emergency Hospital Admission (emergent condition) X- 1 10 Simple Discharge Coordination []  - 0 Complex (extensive) Discharge Coordination PROCESS - Special Needs []  - Pediatric / Minor Patient Management 0 []  - 0 Isolation Patient Management []  - 0 Hearing / Language / Visual special needs []  - 0 Assessment of Community assistance (transportation, D/C planning, etc.) [name].  ([id number]) []  - 0 Additional assistance / Altered mentation []  - 0 Support Surface(s) Assessment (bed, cushion, seat, etc.) INTERVENTIONS - Wound Cleansing / Measurement X - Simple Wound Cleansing - one wound  1 5 []  - 0 Complex Wound Cleansing - multiple wounds X- 1 5 Wound Imaging (photographs - any number of wounds) []  - 0 Wound Tracing (instead of photographs) X- 1 5 Simple Wound Measurement - one wound []  - 0 Complex Wound Measurement - multiple wounds INTERVENTIONS - Wound Dressings []  - Small Wound Dressing one or multiple wounds 0 X- 1 15 Medium Wound Dressing one or multiple wounds []  - 0 Large Wound Dressing one or multiple wounds []  - 0 Application of Medications - topical []  - 0 Application of Medications - injection INTERVENTIONS - Miscellaneous []  - External ear exam 0 []  - 0 Specimen Collection (cultures, biopsies, blood, body fluids, etc.) []  - 0 Specimen(s) / Culture(s) sent or taken to Lab for analysis []  - 0 Patient Transfer (multiple staff / Civil Service fast streamer / Similar devices) []  - 0 Simple Staple / Suture removal (25 or less) []  - 0 Complex Staple / Suture removal (26 or more) []  - 0 Hypo / Hyperglycemic Management (close monitor of Blood Glucose) []  - 0 Ankle / Brachial Index (ABI) - do not check if billed separately X- 1 5 Vital Signs Has the patient been seen at the hospital within the last three years: Yes Total Score: 80 Level Of Care: New/Established - Level 3 Electronic Signature(s) Signed: 07/09/2019 11:52:58 AM By: [name] Entered By: [name] on 07/09/2019 11:22:47 [name] ([id number]) -------------------------------------------------------------------------------- Encounter Discharge Information Details Patient Name: [name] Date of Service: 07/09/2019 11:00 AM Medical Record Number: [id number] Patient Account Number: [id number] Date of Birth/Sex: [birth year] (73 y.o. M) Treating RN: [name] Primary Care  [name]: [name] Other Clinician: Referring [name]: [name] Treating [name]/Extender: [name], [name] Weeks in Treatment: 21 Encounter Discharge Information Items Discharge Condition: Stable Ambulatory Status: Ambulatory Discharge Destination: Home Transportation: Private Auto Accompanied By: self Schedule Follow-up Appointment: Yes Clinical Summary of Care: Electronic Signature(s) Signed: 07/09/2019 11:52:58 AM By: [name] Entered By: [name] on 07/09/2019 11:23:21 [name] ([id number]) -------------------------------------------------------------------------------- Lower Extremity Assessment Details Patient Name: [name] Date of Service: 07/09/2019 11:00 AM Medical Record Number: [id number] Patient Account Number: [id number] Date of Birth/Sex: 1945-08-09 (73 y.o. M) Treating RN: [name] Primary Care Jahson Emanuele: [name] Other Clinician: Referring Makhayla Mcmurry: [name] Treating Marlaine Arey/Extender: [name], [name] Weeks in Treatment: 21 Edema Assessment Assessed: [Left: No] [Right: No] Edema: [Left: N] [Right: o] Vascular Assessment Pulses: Dorsalis Pedis Palpable: [Left:Yes] Electronic Signature(s) Signed: 07/09/2019 4:17:26 PM By: [name] Entered By: [name] on 07/09/2019 11:13:17 [name] ([id number]) -------------------------------------------------------------------------------- Multi Wound Chart Details Patient Name: [name] Date of Service: 07/09/2019 11:00 AM Medical Record Number: [id number] Patient Account Number: [id number] Date of Birth/Sex: 05-11-1945 (73 y.o. 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] [N/A:N/A] Comorbid History: [5:Cataracts, Middle ear problems, Sleep Apnea, Hypertension, Type II Diabetes, Gout, Osteoarthritis, Neuropathy] [N/A:N/A] Date Acquired: [5:01/19/2019] [N/A:N/A] Weeks of Treatment: [5:21] [N/A:N/A] Wound Status: [5:Open] [N/A:N/A] Measurements L x W x D (cm) [5:0.5x0.4x0.1] [N/A:N/A] Area (cm) : [5:0.157] [N/A:N/A] Volume (cm) : [5:0.016] [N/A:N/A] % Reduction in Area: [5:42.90%] [N/A:N/A] % Reduction in Volume: [5:80.50%] [N/A:N/A] Classification: [  5:Grade 2] [N/A:N/A] Exudate Amount: [5:Small] [N/A:N/A] Exudate Type: [5:Serosanguineous] [N/A:N/A] Exudate Color: [5:red, brown] [N/A:N/A] Wound Margin: [5:Flat and Intact] [N/A:N/A] Granulation Amount: [5:Large (67-100%)] [N/A:N/A] Granulation Quality: [5:[name]] [N/A:N/A] Necrotic Amount: [5:None Present (0%)] [N/A:N/A] Exposed Structures: [5:Fat Layer (Subcutaneous Tissue) Exposed: Yes Fascia: No Tendon: No Muscle: No Joint: No Bone: No Medium (34-66%)] [N/A:N/A N/A] Treatment Notes Electronic Signature(s) Signed: 07/09/2019 11:52:58 AM By: [name] Entered By: [name] on 07/09/2019 11:17:06 [name] ([id number]) -------------------------------------------------------------------------------- Multi-Disciplinary Care Plan Details Patient Name: [name] Date of Service: 07/09/2019 11:00 AM Medical Record Number: [id number] Patient Account Number: [id number] Date of Birth/Sex: 08/01/1945 (73 y.o. M) Treating RN: [name] Primary Care Nya Monds: [name] Other Clinician: Referring Deon Ivey: [name] Treating Mary-Anne Polizzi/Extender: [name], [name] Weeks in Treatment: 21 Active Inactive Pressure Nursing  Diagnoses: Knowledge deficit related to management of pressures ulcers Goals: Patient will remain free from development of additional pressure ulcers Date Initiated: 04/30/2019 Target Resolution Date: 05/14/2019 Goal Status: Active Patient/caregiver will verbalize understanding of pressure ulcer management Date Initiated: 04/30/2019 Target Resolution Date: 05/14/2019 Goal Status: Active Interventions: Assess: immobility, friction, shearing, incontinence upon admission and as needed Treatment Activities: Patient referred for pressure reduction/relief devices : 04/30/2019 Notes: Wound/Skin Impairment Nursing Diagnoses: Impaired tissue integrity Goals: Ulcer/skin breakdown will have a volume reduction of 30% by week 4 Date Initiated: 04/30/2019 Target Resolution Date: 05/31/2019 Goal Status: Active Interventions: Assess patient/caregiver ability to obtain necessary supplies Assess ulceration(s) every visit Treatment Activities: Referred to DME Teofil Maniaci for dressing supplies : 04/30/2019 Skin care regimen initiated : 04/30/2019 Topical wound management initiated : 04/30/2019 Notes: Electronic Signature(s) Signed: 07/09/2019 11:52:58 AM By: [name] Entered By: [name] on 07/09/2019 11:16:56 [name] ([id number]) -------------------------------------------------------------------------------- Pain Assessment Details Patient Name: [name] Date of Service: 07/09/2019 11:00 AM Medical Record Number: [id number] Patient Account Number: [id number] Date of Birth/Sex: 1945-06-12 (73 y.o. M) Treating RN: [name] Primary Care Charnika Herbst: [name] Other Clinician: Referring Micaila Ziemba: [name] Treating Sophiamarie Nease/Extender: [name], [name] Weeks in Treatment: 21 Active Problems Location of Pain Severity and Description of Pain Patient Has Paino No Site Locations Pain Management and Medication Current Pain Management: Electronic Signature(s) Signed:  07/09/2019 4:17:26 PM By: [name] Entered By: [name] on 07/09/2019 11:11:49 [name] ([id number]) -------------------------------------------------------------------------------- Patient/Caregiver Education Details Patient Name: [name] Date of Service: 07/09/2019 11:00 AM Medical Record Number: [id number] Patient Account Number: [id number] Date of Birth/Gender: [birth year] (73 y.o. M) Treating RN: [name] Primary Care Physician: [name] Other Clinician: Referring Physician: Owens [name] Treating Physician/Extender: Sharalyn Ink in Treatment: 21 Education Assessment Education Provided To: Patient Education Topics Provided Wound/Skin Impairment: Handouts: Caring for Your Ulcer Methods: Demonstration, Explain/Verbal Responses: State content correctly Electronic Signature(s) Signed: 07/09/2019 11:52:58 AM By: [name] Entered By: [name] on 07/09/2019 11:22:59 [name] ([id number]) -------------------------------------------------------------------------------- Wound Assessment Details Patient Name: [name] Date of Service: 07/09/2019 11:00 AM Medical Record Number: [id number] Patient Account Number: [id number] Date of Birth/Sex: 1946/03/23 (73 y.o. M) Treating RN: [name] Primary Care Mackensi Mahadeo: [name] Other Clinician: Referring Hoorain Kozakiewicz: [name] Treating Adama Ivins/Extender: [name], [name] Weeks in Treatment: 21 Wound Status Wound Number: 5 Primary Diabetic Wound/Ulcer of the Lower Extremity Etiology: Wound Location: Left Foot - Lateral Wound Open Wounding Event: Pressure Injury Status: Date Acquired: 01/19/2019 Comorbid Cataracts, Middle ear problems, Sleep Apnea, Weeks Of Treatment: 21 History: Hypertension, Type II Diabetes, Gout, Osteoarthritis, Clustered Wound: No Neuropathy Photos Wound Measurements  Length: (cm) 0.5 % Reduc Width: (cm) 0.4 % Reduc Depth: (cm)  0.1 Epithel Area: (cm) 0.157 Volume: (cm) 0.016 tion in Area: 42.9% tion in Volume: 80.5% ialization: Medium (34-66%) Wound Description Classification: Grade 2 Foul O Wound Margin: Flat and Intact Slough Exudate Amount: Small Exudate Type: Serosanguineous Exudate Color: red, brown dor After Cleansing: No /Fibrino No Wound Bed Granulation Amount: Large (67-100%) Exposed Structure Granulation Quality: [name] Fascia Exposed: No Necrotic Amount: None Present (0%) Fat Layer (Subcutaneous Tissue) Exposed: Yes Tendon Exposed: No Muscle Exposed: No Joint Exposed: No Bone Exposed: No Treatment Notes Wound #5 (Left, Lateral Foot) Notes Silver [employer], telfa Electronic Signature(s) [name], [name] ([id number]) Signed: 07/09/2019 11:44:32 AM By: [name] Entered By: [name] on 07/09/2019 11:44:31 [name] ([id number]) -------------------------------------------------------------------------------- Vitals Details Patient Name: [name] Date of Service: 07/09/2019 11:00 AM Medical Record Number: [id number] Patient Account Number: [id number] Date of Birth/Sex: 05/03/1945 (73 y.o. M) Treating RN: [name] Primary Care Remedy Corporan: [name] Other Clinician: Referring Lauran Romanski: [name] Treating Kemara Quigley/Extender: [name], [name] Weeks in Treatment: 21 Vital Signs Time Taken: 11:15 Temperature (F): 97.9 Height (in): 71 Pulse (bpm): 99 Weight (lbs): 205 Respiratory Rate (breaths/min): 16 Body Mass Index (BMI): 28.6 Blood Pressure (mmHg): 122/63 Reference Range: 80 - 120 mg / dl Electronic Signature(s) Signed: 07/09/2019 11:52:58 AM By: [name] Entered By: [name] on 07/09/2019 11:16:47

## 2019-07-11 ENCOUNTER — Ambulatory Visit: Payer: Medicare Other | Admitting: Family Medicine

## 2019-07-13 NOTE — Progress Notes (Signed)
SKYE, RODARTE ([id number]) Visit Report for 07/09/2019 Chief Complaint Document Details Patient Name: [name], [name] Date of Service: 07/09/2019 11:00 AM Medical Record Number: [id number] Patient Account Number: [id number] Date of Birth/Sex: January 30, 1946 (74 y.o. M) Treating RN: Army Melia Primary Care Provider: [name] Other Clinician: Referring Provider: [name] Treating Provider/Extender: Melburn Hake, Standley Bargo Weeks in Treatment: 21 Information Obtained from: Patient Chief Complaint Left dorsal foot ulcer 02/09/2019; patient is here for review of the wound on the left lateral foot roughly at the level of the base of the fifth metatarsal Electronic Signature(s) Signed: 07/09/2019 2:21:26 PM By: Worthy Keeler [name] Entered By: Worthy Keeler on 07/09/2019 14:21:26 [name] ([id number]) -------------------------------------------------------------------------------- HPI Details Patient Name: [name] Date of Service: 07/09/2019 11:00 AM Medical Record Number: [id number] Patient Account Number: [id number] Date of Birth/Sex: Dec 24, 1945 (74 y.o. M) Treating RN: Army Melia Primary Care Provider: [name] Other Clinician: Referring Provider: [name] Treating Provider/Extender: Melburn Hake, Kylynn Street Weeks in Treatment: 21 History of Present Illness HPI Description: 10/04/16 on evaluation today patient presents for initial evaluation concerning a laceration of the right wrist and hand on the bowler aspect. He tells me that he fell into glass following a medication change in regard to his diabetes. He initially went to [facility] where she was sutured and subsequently his daughter who is a Marine scientist removed the teachers at the appropriate time. Unfortunately the wound has done poorly and dehissed which has caused him some trouble including infection. He has been on antibiotics both topically and orally though the wound continues to show signs of  necrotic tissue according to notes which is what he was sent to [place] for evaluation concerning. He does have diabetes and unfortunately this is severely uncontrolled. He does see an endocrinologist and is working on this. 10/11/2016 -- the patient's notes from the ER were reviewed and he had his suturing done on 09/17/2016 and had necrotic skin flaps which had to be trimmed the last visit he was here. He has managed to get his Santyl ointment and has been using this appropriately. 10/21/16 patient's wound appears to be doing very well on evaluation today. He still has some Slough covering the wound bed but this is nowhere near as severe as when i saw this initially two weeks ago nor even my review of his pictures last week. I'm pleased with how this is progressing. Patient's wound over the right form appears to be doing fairly well on evaluation today. There is no evidence of infection and he has a small area at the crease of where she is wrist extends and flexes that is still open and appears to be having difficulty due to the fact that it is cracking open. I think this is something that may be controlled with moisture control that is keeping the wound a little bit more moist. Nonetheless this is a tiny region compared to the initial wound and overall appears to be doing very well. No fevers, chills, nausea, or vomiting noted at this time. 11/25/16 on evaluation today patient appears to be doing well in regard to his right wrist ulcer. In fact this appears to be completely healed he is having no discomfort. He is pleased with how this is progressed. Readmission: 06/24/17 on evaluation today patient appears back in our clinic regarding issues that he has been having with his right ankle on the lateral malleolus as well is in the dorsal surface of his left foot. Both have  been going on for several weeks unfortunately. He tells me that initially this was being managed by Dr. Edilia Bo and I did review the  notes from Dr. Edilia Bo in epic. Patient was placed on amoxicillin initially and then subsequently Keflex following. He has been using triple antibiotic ointment over-the-counter along with a Band-Aid for the wound currently. He did not have any Santyl remaining. His most recent hemoglobin A1c which was just two days ago was 10.3. Fortunately he seems to have excellent blood flow in the bilateral lower extremities with his left ABI being 0.95 in the right ABI 1.01. Patient does have some pain in regard to each of these ulcerations but he tells me it's not nearly as severe as the hand wound that I help treat earlier over the summer. This was in 2018. 07/15/17 on evaluation today patient's lower surety ulcer is actually both appear to be doing fairly well at this point. He does have a little bit of skin overlapping the ulcer on the right lateral ankle this was debrided away today just with saline and gauze without complication patient had no discomfort or pain. Otherwise patient's left foot ulcer does appear to be showing signs of improvement he does seem to have a right great toe. Nikki and noted at this point in time. I think that this does need to be managed at this point. No fevers, chills, nausea, or vomiting noted at this time. 07/22/17 on evaluation today patient's wounds in regard to his lower extremities appear to be doing somewhat better. His right ankle wound in fact appears to likely be completely closed. The left dorsal foot ulcer though still open does appear to be a little bit smaller and does also seem to be making good progress. He has not been having problems with the treatment at this point in the general he does not seem to show as much erythema surrounding the wound bed which is good news. He is not having significant discomfort. 08/12/17 on evaluation today patients wounds of both appeared to be doing better in fact they both appear to be completely healed. Overall I'm very pleased with  how things have progressed over the past week even more than what I expected. Obviously this is good news and he is happy about this. He did have a fall when going to his orthopedic doctor recently fortunately he does not appear to have injured anything severely but nonetheless does have a couple of bumps and bruises nothing significant at this time but he needs to be seen and wound care for. He may have broken his left wrist he is following up with orthopedics in that regard. Readmission: 10/24/17 on evaluation today patient presents for reevaluation after having been discharged Aug 12, 2017. His wound was healed at that point although he states that it has been somewhat discolored since that time. He has not experienced the drainage as far as the wound is concerned but the fact that it was still discolored been greater than two months after the fact made him want to come in for reevaluation. Upon inspection today the patient has no pain although he does have neuropathy. He does have a discolored region of the dorsal surface of the left foot which was the site where the ulcer was that we previously treated. The good news is he does not again have any discomfort although obviously I do believe this may be some scar tissue and just discoloration in that regard. Again he hasn't had any drainage or  discharge since I last saw him and at this point I see no evidence of that either this area shows complete epithelialization. In general I do not feel like there's any significant issue at this point in that regard. [name], [name] ([id number]) 02/09/2019 This is a 74 year old man with type 2 diabetes poorly controlled with significant peripheral neuropathy. He has been in this clinic before in 2018 with a laceration on his right hand and then again in 2019 with an area over the right lateral malleolus. 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 He states that the wound is been there for the last week or 2. It is been uncomfortable to walk on. The man is an active man having to look after an elderly mother. Past medical history, type 2 diabetes with polyneuropathy with a recent hemoglobin A1c in September 2011 0.3, lumbar disc disease, hypergonadism., Left foot drop and gout. We did not repeat his ABIs in the clinic quoting values from July 2019 of 0.95 and 1.01. He is not felt to have significant PAD 02/19/2019 on evaluation today patient actually appears to be doing better with regard to his foot wound based on what I am seeing currently. He was seen by Dr. Dellia Nims on readmission back into the clinic last week when I was off. The good news is the patient has been tolerating the dressing changes without any complication in fact has not really had much in the way of drainage. He does have an AFO brace which she believes is actually pushing on this area as well and has contributed to the issue. Fortunately there is no signs of active infection at this point. His ABIs appear to be well. 03/05/2019 on evaluation today patient appears to be doing well in regard to his foot ulcer. He has been tolerating the dressing changes without complication. Fortunately there is no signs of active infection. The wound is measuring somewhat smaller and overall I feel like he is making good progress at this point. 03/12/2019 on evaluation today patient actually appears to be doing quite well with regard to his foot ulcer. The wound is looking better the measurement is not significantly better at this point unfortunately but nonetheless again the overall appearance is improving I think he is making some progress. I still think the big issue here is foot drop and again the patient wants to see if potentially he could get a brace to help with this. Subsequently I think that we can definitely make a referral to [facility] to see if they  can help [place] in this regard. He is in agreement with that plan. 03/26/2019 on evaluation today patient appears to be doing more poorly in regard to his foot ulcer at this time. He did see podiatry in order to see about getting a brace but unfortunately he tells me that Medicare is not likely to pay for one for him as he had 1 I guess 8 months ago. However this one that apparently goes in his shoes that does not seem to be working out well for him. Unfortunately there are signs of active infection at this time. No fevers, chills, nausea, vomiting, or diarrhea. Systemically. The patient has erythema on the dorsal surface of his foot that tracks from the lateral portion of his foot and again he does have a blister around the wound as well which is worse compared  to previous. Overall I am concerned. 04/02/2019 on evaluation today patient appears to be doing about the same with regard to his wound. Unfortunately there is no signs of significant improvement although I do think there is no signs of infection which is good news. His drainage is also slowed down tremendously. I think at this point he would benefit from the initiation of a total contact cast. The only concern I have is that over his balance. I explained to the patient that if we were to initiate the cast I think it would be greatly beneficial for him but he would need to ensure not to walk at all unless he uses a walker he actually has 2 walkers one for inside his home and one that he keeps out in the car. For that reason he would be covered in that regard. I just do not want him to have any accidents and fall with the cast. 12/28-Patient comes in 1 week out a day early for his appointment he was in the hospital for 2 days for uncontrolled hyperglycemia and required management of high blood sugars, with some adjustments in his home regimen of insulin, patient stubbed his right great toe today and put a Band-Aid on it, he is here for his left  lateral foot ulcer and we are using silver alginate dressing, he was intact total contact cast with difficulty with balance to the extent that that had to be discontinued 04/16/2019 on evaluation today patient appears to be doing better with regard to the overall appearance of his wound size. This is good news. We had attempted a total contact cast but unfortunately he had some falling issues that the family contacted [place] about we took the cast off we did not put it back on. Nonetheless it ended up that just a couple days later he was in the hospital due to elevations in his blood sugar which went to around 500 and even a little higher. Subsequently he was admitted to the hospital and was in the hospital for a total of 3 days and that included Christmas day. Fortunately there got his blood sugar under control he tells me that this is running about 200 now which is much better news. There does not appear to be any signs of active infection at this time. No fevers, chills, nausea, vomiting, or diarrhea. 04/30/2019 on evaluation today patient actually appears to be doing slightly worse compared to previous as far as the overall size of his wound is concerned. There also is some need for sharp debridement today as well. Fortunately there is no evidence of active infection which is good news. No fevers, chills, nausea, vomiting, or diarrhea. 05/07/2019 upon evaluation today patient appears to be doing excellent in regard to his plantar foot ulcer. He has been tolerating the dressing changes without complication. Fortunately there is no signs of active infection at this time. No fevers, chills, nausea, vomiting, or diarrhea. He does tell me that he actually got in touch with [employer] and they feel like they can add diagnosis codes to his orders in order to get an external brace for him as opposed to the one that goes internal to issue. That would be excellent for his foot drop and I did tell him that if there is  anything I need to fill out in that regard I will be more than happy to oblige and fill out what is needed for [employer]. 05/14/2019 upon evaluation today patient's original wound site actually appears to  be doing much better which is good news. Fortunately there is no signs of active infection at this time. No fevers, chills, nausea, vomiting, or diarrhea. With that being said the patient unfortunately is continuing to have issues with rubbing he tells me that the felt slid on his foot although he did not realize it until today. Nonetheless I think this did cause a blister around the edge of the wound and he does an open wound that is slightly larger overall in measurement compared to last time mainly due to this blister. Other than that I feel like he is doing quite well. 05/21/2019 upon evaluation today patient appears to be doing excellent in regard to his wound at this time on the plantar foot. He is showing signs of improvement he does have some slough and biofilm noted on the surface of the wound this is going require some sharp debridement today as well. Fortunately there is no evidence of active infection. No fevers, chills, nausea, vomiting, or diarrhea. 05/28/2019 on evaluation today patient's wound actually appears to be measuring smaller upon initial inspection and I do believe that it is doing well. [name], [name] ([id number]) With that being said he continues to have a blister on the outside/lateral edge of the wound that occurs as a result of rubbing due to his dropfoot currently. Subsequently we have filled out the paperwork for his new external brace but again that does take several weeks to get that completed once everything is returned to back we did send this back last week as far as the paperwork was concerned. With that being said there does not appear to be any signs of active infection at this time. 06/04/2019 on evaluation today patient's wound actually appears to be doing  slightly better compared to last week which is good news. He still states that his insurance unfortunately does not seem to want to cover any of his new brace. He is going to just pay for this himself. Nonetheless he is going by there today when he leaves here that is [employer] in [place] that is going to be making this brace for him. 06/18/2019 upon evaluation today patient fortunately does have his new brace which is the external AFO brace with bilateral uprights. Subsequently he seems to be doing much better in fact the wound already appears better. He has not even been using the felt he is just using a dressing over top of the wound and overall I am very pleased with how things seem to be progressing. There is no signs of active infection at this time which is great news and I think that the AFO is good to help him tremendously as far as trying to keep pressure off of the 06/25/2019 upon evaluation today patient seems to be making good progress in regard to his wound. He has been tolerating the dressing changes without complication. Fortunately there is no signs of active infection at this time. No fevers, chills, nausea, vomiting, or diarrhea. 07/02/2019 upon evaluation today patient unfortunately had a blistered area on the bottom of his foot tracking more towards the midline of the plantar aspect. He does not seem to have issues with rubbing more laterally that he used to have but nonetheless this is still giving him some trouble and causing problems here. Fortunately there is no signs of active infection at this time. No fevers, chills, nausea, vomiting, or diarrhea. 07/09/2019 upon evaluation today patient appears to be doing well with regard to his plantar foot  ulcer. Definitely this is much improved compared to last week's visit which again last week the issue there was that he had an area of callus buildup on the plantar aspect of his foot I was able to clean this away and after effectively  doing so the wound seems to be doing much better which is great news. There is no signs of active infection at this time. Electronic Signature(s) Signed: 07/09/2019 4:51:25 PM By: Worthy Keeler [name] Entered By: Worthy Keeler on 07/09/2019 16:51:25 [name] ([id number]) -------------------------------------------------------------------------------- Physical Exam Details Patient Name: [name] Date of Service: 07/09/2019 11:00 AM Medical Record Number: [id number] Patient Account Number: [id number] Date of Birth/Sex: 1945-09-19 (73 y.o. M) Treating RN: Army Melia Primary Care Provider: [name] Other Clinician: Referring Provider: [name] Treating Provider/Extender: [name], Elma Limas Weeks in Treatment: 21 Constitutional Well-nourished and well-hydrated in no acute distress. Respiratory normal breathing without difficulty. Psychiatric this patient is able to make decisions and demonstrates good insight into disease process. Alert and Oriented x 3. pleasant and cooperative. Notes Upon inspection patient's wound bed showed signs of good granulation and epithelization at this point. Fortunately there is no evidence of infection which is excellent news. No sharp debridement was necessary today I was unable to mechanically debride away the slough with a Saline and gauze debridement. Electronic Signature(s) Signed: 07/09/2019 4:52:32 PM By: Worthy Keeler [name] Entered By: Worthy Keeler on 07/09/2019 16:52:32 [name] ([id number]) -------------------------------------------------------------------------------- Physician Orders Details Patient Name: [name] Date of Service: 07/09/2019 11:00 AM Medical Record Number: [id number] Patient Account Number: [id number] Date of Birth/Sex: Apr 09, 1946 (73 y.o. M) Treating RN: Army Melia Primary Care Provider: [name] Other Clinician: Referring Provider: [name] Treating  Provider/Extender: Melburn Hake, Haniyyah Sakuma Weeks in Treatment: 21 Verbal / Phone Orders: No Diagnosis Coding Wound Cleansing Wound #5 Left,Lateral Foot o May shower with protection. Anesthetic (add to Medication List) Wound #5 Left,Lateral Foot o Topical Lidocaine 4% cream applied to wound bed prior to debridement (In Clinic Only). Primary Wound Dressing Wound #5 Left,Lateral Foot o Silver Alginate Secondary Dressing Wound #5 [place] Dressing Change Frequency Wound #5 Left,Lateral Foot o Change dressing every other day. Follow-up Appointments Wound #5 Left,Lateral Foot o Return Appointment in 1 week. Edema Control Wound #5 Left,Lateral Foot o Elevate legs to the level of the heart and pump ankles as often as possible Additional Orders / Instructions Wound #5 Left,Lateral Foot o Other: - Patient to follow-up with [employer] for adjustments to braces and shoes. Requested paperwork sent to [employer]. Electronic Signature(s) Signed: 07/09/2019 11:52:58 AM By: Army Melia Signed: 07/12/2019 5:29:46 PM By: Worthy Keeler [name] Entered By: Army Melia on 07/09/2019 11:19:36 [name] ([id number]) -------------------------------------------------------------------------------- Problem List Details Patient Name: [name] Date of Service: 07/09/2019 11:00 AM Medical Record Number: [id number] Patient Account Number: [id number] Date of Birth/Sex: February 23, 1946 (73 y.o. M) Treating RN: Army Melia Primary Care Provider: [name] Other Clinician: Referring Provider: [name] Treating Provider/Extender: Melburn Hake, Orval Dortch Weeks in Treatment: 21 Active Problems ICD-10 Evaluated Encounter Code Description Active Date Today Diagnosis E11.621 Type 2 diabetes mellitus with foot ulcer 02/09/2019 No Yes E11.42 Type 2 diabetes mellitus with diabetic polyneuropathy 02/09/2019 No Yes L97.522 Non-pressure chronic ulcer of other part of  left foot with fat layer exposed 02/09/2019 No Yes M21.372 Foot drop, left foot 02/09/2019 No Yes Inactive Problems Resolved Problems Electronic Signature(s) Signed: 07/09/2019 2:21:21 PM By: Worthy Keeler [name]  Entered By: Worthy Keeler on 07/09/2019 14:21:19 [name] ([id number]) -------------------------------------------------------------------------------- Progress Note Details Patient Name: [name] Date of Service: 07/09/2019 11:00 AM Medical Record Number: [id number] Patient Account Number: [id number] Date of Birth/Sex: [birth year] (73 y.o. M) Treating RN: Army Melia Primary Care Provider: [name] Other Clinician: Referring Provider: [name] Treating Provider/Extender: Melburn Hake, Mackenzie Lia Weeks in Treatment: 21 Subjective Chief Complaint Information obtained from Patient Left dorsal foot ulcer 02/09/2019; patient is here for review of the wound on the left lateral foot roughly at the level of the base of the fifth metatarsal History of Present Illness (HPI) 10/04/16 on evaluation today patient presents for initial evaluation concerning a laceration of the right wrist and hand on the bowler aspect. He tells me that he fell into glass following a medication change in regard to his diabetes. He initially went to [facility] where she was sutured and subsequently his daughter who is a Marine scientist removed the teachers at the appropriate time. Unfortunately the wound has done poorly and dehissed which has caused him some trouble including infection. He has been on antibiotics both topically and orally though the wound continues to show signs of necrotic tissue according to notes which is what he was sent to [place] for evaluation concerning. He does have diabetes and unfortunately this is severely uncontrolled. He does see an endocrinologist and is working on this. 10/11/2016 -- the patient's notes from the ER were reviewed and he had his suturing done on  09/17/2016 and had necrotic skin flaps which had to be trimmed the last visit he was here. He has managed to get his Santyl ointment and has been using this appropriately. 10/21/16 patient's wound appears to be doing very well on evaluation today. He still has some Slough covering the wound bed but this is nowhere near as severe as when i saw this initially two weeks ago nor even my review of his pictures last week. I'm pleased with how this is progressing. Patient's wound over the right form appears to be doing fairly well on evaluation today. There is no evidence of infection and he has a small area at the crease of where she is wrist extends and flexes that is still open and appears to be having difficulty due to the fact that it is cracking open. I think this is something that may be controlled with moisture control that is keeping the wound a little bit more moist. Nonetheless this is a tiny region compared to the initial wound and overall appears to be doing very well. No fevers, chills, nausea, or vomiting noted at this time. 11/25/16 on evaluation today patient appears to be doing well in regard to his right wrist ulcer. In fact this appears to be completely healed he is having no discomfort. He is pleased with how this is progressed. Readmission: 06/24/17 on evaluation today patient appears back in our clinic regarding issues that he has been having with his right ankle on the lateral malleolus as well is in the dorsal surface of his left foot. Both have been going on for several weeks unfortunately. He tells me that initially this was being managed by Dr. Edilia Bo and I did review the notes from Dr. Edilia Bo in epic. Patient was placed on amoxicillin initially and then subsequently Keflex following. He has been using triple antibiotic ointment over-the-counter along with a Band-Aid for the wound currently. He did not have any Santyl remaining. His most recent hemoglobin A1c which was just  two  days ago was 10.3. Fortunately he seems to have excellent blood flow in the bilateral lower extremities with his left ABI being 0.95 in the right ABI 1.01. Patient does have some pain in regard to each of these ulcerations but he tells me it's not nearly as severe as the hand wound that I help treat earlier over the summer. This was in 2018. 07/15/17 on evaluation today patient's lower surety ulcer is actually both appear to be doing fairly well at this point. He does have a little bit of skin overlapping the ulcer on the right lateral ankle this was debrided away today just with saline and gauze without complication patient had no discomfort or pain. Otherwise patient's left foot ulcer does appear to be showing signs of improvement he does seem to have a right great toe. Nikki and noted at this point in time. I think that this does need to be managed at this point. No fevers, chills, nausea, or vomiting noted at this time. 07/22/17 on evaluation today patient's wounds in regard to his lower extremities appear to be doing somewhat better. His right ankle wound in fact appears to likely be completely closed. The left dorsal foot ulcer though still open does appear to be a little bit smaller and does also seem to be making good progress. He has not been having problems with the treatment at this point in the general he does not seem to show as much erythema surrounding the wound bed which is good news. He is not having significant discomfort. 08/12/17 on evaluation today patients wounds of both appeared to be doing better in fact they both appear to be completely healed. Overall I'm very pleased with how things have progressed over the past week even more than what I expected. Obviously this is good news and he is happy about this. He did have a fall when going to his orthopedic doctor recently fortunately he does not appear to have injured anything severely but nonetheless does have a couple of bumps and  bruises nothing significant at this time but he needs to be seen and wound care for. He may have broken his left wrist he is following up with orthopedics in that regard. Readmission: 10/24/17 on evaluation today patient presents for reevaluation after having been discharged Aug 12, 2017. His wound was healed at that point although he states that it has been somewhat discolored since that time. He has not experienced the drainage as far as the wound is concerned but the fact that it was still discolored been greater than two months after the fact made him want to come in for reevaluation. Upon inspection today the patient has no pain although he does have neuropathy. He does have a discolored region of the dorsal surface of the left foot which was the site where the ulcer was [name], [name]. ([id number]) that we previously treated. The good news is he does not again have any discomfort although obviously I do believe this may be some scar tissue and just discoloration in that regard. Again he hasn't had any drainage or discharge since I last saw him and at this point I see no evidence of that either this area shows complete epithelialization. In general I do not feel like there's any significant issue at this point in that regard. 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 He has been in this clinic before in 2018 with a laceration  on his right hand and then again in 2019 with an area over the right lateral malleolus. 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 He states that the wound is been there for the last week or 2. It is been uncomfortable to walk on. The man is an active man having to look after an elderly mother. Past medical history, type 2 diabetes with polyneuropathy with a recent hemoglobin A1c in September 2011 0.3, lumbar  disc disease, hypergonadism., Left foot drop and gout. We did not repeat his ABIs in the clinic quoting values from July 2019 of 0.95 and 1.01. He is not felt to have significant PAD 02/19/2019 on evaluation today patient actually appears to be doing better with regard to his foot wound based on what I am seeing currently. He was seen by Dr. Dellia Nims on readmission back into the clinic last week when I was off. The good news is the patient has been tolerating the dressing changes without any complication in fact has not really had much in the way of drainage. He does have an AFO brace which she believes is actually pushing on this area as well and has contributed to the issue. Fortunately there is no signs of active infection at this point. His ABIs appear to be well. 03/05/2019 on evaluation today patient appears to be doing well in regard to his foot ulcer. He has been tolerating the dressing changes without complication. Fortunately there is no signs of active infection. The wound is measuring somewhat smaller and overall I feel like he is making good progress at this point. 03/12/2019 on evaluation today patient actually appears to be doing quite well with regard to his foot ulcer. The wound is looking better the measurement is not significantly better at this point unfortunately but nonetheless again the overall appearance is improving I think he is making some progress. I still think the big issue here is foot drop and again the patient wants to see if potentially he could get a brace to help with this. Subsequently I think that we can definitely make a referral to [facility] to see if they can help [place] in this regard. He is in agreement with that plan. 03/26/2019 on evaluation today patient appears to be doing more poorly in regard to his foot ulcer at this time. He did see podiatry in order to see about getting a brace but unfortunately he tells me that Medicare is not likely to pay for one for  him as he had 1 I guess 8 months ago. However this one that apparently goes in his shoes that does not seem to be working out well for him. Unfortunately there are signs of active infection at this time. No fevers, chills, nausea, vomiting, or diarrhea. Systemically. The patient has erythema on the dorsal surface of his foot that tracks from the lateral portion of his foot and again he does have a blister around the wound as well which is worse compared to previous. Overall I am concerned. 04/02/2019 on evaluation today patient appears to be doing about the same with regard to his wound. Unfortunately there is no signs of significant improvement although I do think there is no signs of infection which is good news. His drainage is also slowed down tremendously. I think at this point he would benefit from the initiation of a total contact cast. The only concern I  have is that over his balance. I explained to the patient that if we were to initiate the cast I think it would be greatly beneficial for him but he would need to ensure not to walk at all unless he uses a walker he actually has 2 walkers one for inside his home and one that he keeps out in the car. For that reason he would be covered in that regard. I just do not want him to have any accidents and fall with the cast. 12/28-Patient comes in 1 week out a day early for his appointment he was in the hospital for 2 days for uncontrolled hyperglycemia and required management of high blood sugars, with some adjustments in his home regimen of insulin, patient stubbed his right great toe today and put a Band-Aid on it, he is here for his left lateral foot ulcer and we are using silver alginate dressing, he was intact total contact cast with difficulty with balance to the extent that that had to be discontinued 04/16/2019 on evaluation today patient appears to be doing better with regard to the overall appearance of his wound size. This is good news. We  had attempted a total contact cast but unfortunately he had some falling issues that the family contacted [place] about we took the cast off we did not put it back on. Nonetheless it ended up that just a couple days later he was in the hospital due to elevations in his blood sugar which went to around 500 and even a little higher. Subsequently he was admitted to the hospital and was in the hospital for a total of 3 days and that included Christmas day. Fortunately there got his blood sugar under control he tells me that this is running about 200 now which is much better news. There does not appear to be any signs of active infection at this time. No fevers, chills, nausea, vomiting, or diarrhea. 04/30/2019 on evaluation today patient actually appears to be doing slightly worse compared to previous as far as the overall size of his wound is concerned. There also is some need for sharp debridement today as well. Fortunately there is no evidence of active infection which is good news. No fevers, chills, nausea, vomiting, or diarrhea. 05/07/2019 upon evaluation today patient appears to be doing excellent in regard to his plantar foot ulcer. He has been tolerating the dressing changes without complication. Fortunately there is no signs of active infection at this time. No fevers, chills, nausea, vomiting, or diarrhea. He does tell me that he actually got in touch with [employer] and they feel like they can add diagnosis codes to his orders in order to get an external brace for him as opposed to the one that goes internal to issue. That would be excellent for his foot drop and I did tell him that if there is anything I need to fill out in that regard I will be more than happy to oblige and fill out what is needed for [employer]. 05/14/2019 upon evaluation today patient's original wound site actually appears to be doing much better which is good news. Fortunately there is no signs of active infection at this time. No  fevers, chills, nausea, vomiting, or diarrhea. With that being said the patient unfortunately is continuing to have issues with rubbing he tells me that the felt slid on his foot although he did not realize it until today. Nonetheless I think this did cause a blister around the edge of the wound  and he does an open wound that is slightly larger overall in measurement compared to last time mainly due to this blister. Other than that I feel like he is doing quite well. [name], [name] ([id number]) 05/21/2019 upon evaluation today patient appears to be doing excellent in regard to his wound at this time on the plantar foot. He is showing signs of improvement he does have some slough and biofilm noted on the surface of the wound this is going require some sharp debridement today as well. Fortunately there is no evidence of active infection. No fevers, chills, nausea, vomiting, or diarrhea. 05/28/2019 on evaluation today patient's wound actually appears to be measuring smaller upon initial inspection and I do believe that it is doing well. With that being said he continues to have a blister on the outside/lateral edge of the wound that occurs as a result of rubbing due to his dropfoot currently. Subsequently we have filled out the paperwork for his new external brace but again that does take several weeks to get that completed once everything is returned to back we did send this back last week as far as the paperwork was concerned. With that being said there does not appear to be any signs of active infection at this time. 06/04/2019 on evaluation today patient's wound actually appears to be doing slightly better compared to last week which is good news. He still states that his insurance unfortunately does not seem to want to cover any of his new brace. He is going to just pay for this himself. Nonetheless he is going by there today when he leaves here that is [employer] in [place] that is going to be making  this brace for him. 06/18/2019 upon evaluation today patient fortunately does have his new brace which is the external AFO brace with bilateral uprights. Subsequently he seems to be doing much better in fact the wound already appears better. He has not even been using the felt he is just using a dressing over top of the wound and overall I am very pleased with how things seem to be progressing. There is no signs of active infection at this time which is great news and I think that the AFO is good to help him tremendously as far as trying to keep pressure off of the 06/25/2019 upon evaluation today patient seems to be making good progress in regard to his wound. He has been tolerating the dressing changes without complication. Fortunately there is no signs of active infection at this time. No fevers, chills, nausea, vomiting, or diarrhea. 07/02/2019 upon evaluation today patient unfortunately had a blistered area on the bottom of his foot tracking more towards the midline of the plantar aspect. He does not seem to have issues with rubbing more laterally that he used to have but nonetheless this is still giving him some trouble and causing problems here. Fortunately there is no signs of active infection at this time. No fevers, chills, nausea, vomiting, or diarrhea. 07/09/2019 upon evaluation today patient appears to be doing well with regard to his plantar foot ulcer. Definitely this is much improved compared to last week's visit which again last week the issue there was that he had an area of callus buildup on the plantar aspect of his foot I was able to clean this away and after effectively doing so the wound seems to be doing much better which is great news. There is no signs of active infection at this time. Objective Constitutional Well-nourished and  well-hydrated in no acute distress. Vitals Time Taken: 11:15 AM, Height: 71 in, Weight: 205 lbs, BMI: 28.6, Temperature: 97.9 F, Pulse: 99 bpm,  Respiratory Rate: 16 breaths/min, Blood Pressure: 122/63 mmHg. Respiratory normal breathing without difficulty. Psychiatric this patient is able to make decisions and demonstrates good insight into disease process. Alert and Oriented x 3. pleasant and cooperative. General Notes: Upon inspection patient's wound bed showed signs of good granulation and epithelization at this point. Fortunately there is no evidence of infection which is excellent news. No sharp debridement was necessary today I was unable to mechanically debride away the slough with a Saline and gauze debridement. Integumentary (Hair, Skin) Wound #5 status is Open. Original cause of wound was Pressure Injury. The wound is located on the Left,Lateral Foot. The wound measures 0.5cm length x 0.4cm width x 0.1cm depth; 0.157cm^2 area and 0.016cm^3 volume. There is Fat Layer (Subcutaneous Tissue) Exposed exposed. There is a small amount of serosanguineous drainage noted. The wound margin is flat and intact. There is large (67-100%) pink granulation within the wound bed. There is no necrotic tissue within the wound bed. 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 Anesthetic (add to Medication List): Wound #5 Left,Lateral Foot: Topical Lidocaine 4% cream applied to wound bed prior to debridement (In Clinic Only). Primary Wound Dressing: Wound #5 Left,Lateral Foot: Silver Alginate Secondary Dressing: Wound #5 Left,Lateral Foot: [place] Dressing Change Frequency: Wound #5 Left,Lateral Foot: Change dressing every other day. Follow-up Appointments: Wound #5 Left,Lateral Foot: Return Appointment in 1 week. Edema Control: Wound #5  Left,Lateral Foot: Elevate legs to the level of the heart and pump ankles as often as possible Additional Orders / Instructions: Wound #5 Left,Lateral Foot: Other: - Patient to follow-up with [employer] for adjustments to braces and shoes. Requested paperwork sent to [employer]. 1. My suggestion at this time is good to being we will go ahead and continue with the current wound care measures with regard to the patient's plantar foot. This includes the utilization of silver alginate which I do feel like has done well for him. 2. I am also can recommend appropriate offloading he should continue to try to prevent any excess pressure to the wound bed. 3. I am also going to recommend that we continue to monitor for any signs of excessive callus buildup he may need some debridement by next week for right now things seem to be doing quite well. We will see patient back for reevaluation in 1 week here in the clinic. If anything worsens or changes patient will contact our office for additional recommendations. Electronic Signature(s) Signed: 07/09/2019 4:54:06 PM By: Worthy Keeler [name] Entered By: Worthy Keeler on 07/09/2019 16:54:06 [name] ([id number]) -------------------------------------------------------------------------------- SuperBill Details Patient Name: [name] Date of Service: 07/09/2019 Medical Record Number: [id number] Patient Account Number: [id number] Date of Birth/Sex: Aug 02, 1945 (73 y.o. M) Treating RN: Army Melia Primary Care Provider: [name] Other Clinician: Referring Provider: [name] Treating Provider/Extender: Melburn Hake, Dahmir Epperly Weeks in Treatment: 21 Diagnosis Coding ICD-10 Codes Code Description E11.621 Type 2 diabetes mellitus with foot ulcer E11.42 Type 2 diabetes mellitus with diabetic polyneuropathy L97.522 Non-pressure chronic ulcer of other part of left foot with fat layer exposed M21.372 Foot drop, left foot  Facility  Procedures CPT4 Code: 52589483 Description: 47583 - WOUND CARE VISIT-LEV 3 EST PT Modifier: Quantity: 1 Physician Procedures CPT4 Code: 0746002 Description: 98473 - WC PHYS LEVEL 3 - EST PT Modifier: Quantity: 1 CPT4 Code: Description: ICD-10 Diagnosis Description E11.621 Type 2 diabetes mellitus with foot ulcer E11.42 Type 2 diabetes mellitus with diabetic polyneuropathy L97.522 Non-pressure chronic ulcer of other part of left foot with fat layer expose M21.372 Foot drop,  left foot Modifier: d Quantity: Electronic Signature(s) Signed: 07/09/2019 4:59:07 PM By: Worthy Keeler [name] Entered By: Worthy Keeler on 07/09/2019 16:59:06

## 2019-07-16 ENCOUNTER — Ambulatory Visit: Payer: Medicare Other | Admitting: Family Medicine

## 2019-07-16 ENCOUNTER — Encounter: Payer: Medicare Other | Attending: Physician Assistant | Admitting: Physician Assistant

## 2019-07-16 ENCOUNTER — Other Ambulatory Visit: Payer: Self-pay

## 2019-07-16 DIAGNOSIS — E11621 Type 2 diabetes mellitus with foot ulcer: Secondary | ICD-10-CM | POA: Insufficient documentation

## 2019-07-16 DIAGNOSIS — L97522 Non-pressure chronic ulcer of other part of left foot with fat layer exposed: Secondary | ICD-10-CM | POA: Diagnosis not present

## 2019-07-16 DIAGNOSIS — M1990 Unspecified osteoarthritis, unspecified site: Secondary | ICD-10-CM | POA: Insufficient documentation

## 2019-07-16 DIAGNOSIS — Z794 Long term (current) use of insulin: Secondary | ICD-10-CM | POA: Insufficient documentation

## 2019-07-16 DIAGNOSIS — E1142 Type 2 diabetes mellitus with diabetic polyneuropathy: Secondary | ICD-10-CM | POA: Diagnosis not present

## 2019-07-16 DIAGNOSIS — I10 Essential (primary) hypertension: Secondary | ICD-10-CM | POA: Diagnosis not present

## 2019-07-16 DIAGNOSIS — M21372 Foot drop, left foot: Secondary | ICD-10-CM | POA: Insufficient documentation

## 2019-07-16 DIAGNOSIS — G4730 Sleep apnea, unspecified: Secondary | ICD-10-CM | POA: Insufficient documentation

## 2019-07-16 DIAGNOSIS — M109 Gout, unspecified: Secondary | ICD-10-CM | POA: Diagnosis not present

## 2019-07-16 NOTE — Progress Notes (Signed)
[name], [name] ([id number]) Visit Report for 07/16/2019 Arrival Information Details Patient Name: [name], [name] Date of Service: 07/16/2019 11:00 AM Medical Record Number: [id number] Patient Account Number: [id number] Date of Birth/Sex: Oct 15, 1945 (73 y.o. M) Treating RN: Montey Hora Primary Care Beaumont Austad: [name] Other Clinician: Referring Cristina Mattern: [name] Treating Deadrian Toya/Extender: Melburn Hake, HOYT Weeks in Treatment: 41 Visit Information History Since Last Visit Added or deleted any medications: No Patient Arrived: Cane Any new allergies or adverse reactions: No Arrival Time: 11:03 Had a fall or experienced change in No Accompanied By: self activities of daily living that may affect Transfer Assistance: None risk of falls: Patient Identification Verified: Yes Signs or symptoms of abuse/neglect since last visito No Secondary Verification Process Completed: Yes Hospitalized since last visit: No Patient Has Alerts: Yes Implantable device outside of the clinic excluding No Patient Alerts: DMII cellular tissue based products placed in the center since last visit: Has Dressing in Place as Prescribed: Yes Has Footwear/Offloading in Place as Prescribed: Yes Left: Other:brace Pain Present Now: No Electronic Signature(s) Signed: 07/16/2019 4:25:15 PM By: Montey Hora Entered By: Montey Hora on 07/16/2019 11:05:53 [name] ([id number]) -------------------------------------------------------------------------------- Clinic Level of Care Assessment Details Patient Name: [name] Date of Service: 07/16/2019 11:00 AM Medical Record Number: [id number] Patient Account Number: [id number] Date of Birth/Sex: [birth year] (73 y.o. M) Treating RN: Army Melia Primary Care Jaclene Bartelt: [name] Other Clinician: Referring Sehaj Mcenroe: [name] Treating Gerrad Welker/Extender: Melburn Hake, HOYT Weeks in Treatment: 22 Clinic Level of Care Assessment  Items TOOL 4 Quantity Score []  - Use when only an EandM is performed on FOLLOW-UP visit 0 ASSESSMENTS - Nursing Assessment / Reassessment []  - Reassessment of Co-morbidities (includes updates in patient status) 0 []  - 0 Reassessment of Adherence to Treatment Plan ASSESSMENTS - Wound and Skin Assessment / Reassessment []  - Simple Wound Assessment / Reassessment - one wound 0 []  - 0 Complex Wound Assessment / Reassessment - multiple wounds []  - 0 Dermatologic / Skin Assessment (not related to wound area) ASSESSMENTS - Focused Assessment []  - Circumferential Edema Measurements - multi extremities 0 []  - 0 Nutritional Assessment / Counseling / Intervention []  - 0 Lower Extremity Assessment (monofilament, tuning fork, pulses) []  - 0 Peripheral Arterial Disease Assessment (using hand held doppler) ASSESSMENTS - Ostomy and/or Continence Assessment and Care []  - Incontinence Assessment and Management 0 []  - 0 Ostomy Care Assessment and Management (repouching, etc.) PROCESS - Coordination of Care []  - Simple Patient / Family Education for ongoing care 0 []  - 0 Complex (extensive) Patient / Family Education for ongoing care []  - 0 Staff obtains Programmer, systems, Records, Test Results / Process Orders []  - 0 Staff telephones HHA, Nursing Homes / Clarify orders / etc []  - 0 Routine Transfer to another Facility (non-emergent condition) []  - 0 Routine Hospital Admission (non-emergent condition) []  - 0 New Admissions / Biomedical engineer / Ordering NPWT, Apligraf, etc. []  - 0 Emergency Hospital Admission (emergent condition) []  - 0 Simple Discharge Coordination []  - 0 Complex (extensive) Discharge Coordination PROCESS - Special Needs []  - Pediatric / Minor Patient Management 0 []  - 0 Isolation Patient Management []  - 0 Hearing / Language / Visual special needs []  - 0 Assessment of Community assistance (transportation, D/C planning, etc.) [name], [name]. ([id number]) []  -  0 Additional assistance / Altered mentation []  - 0 Support Surface(s) Assessment (bed, cushion, seat, etc.) INTERVENTIONS - Wound Cleansing / Measurement []  - Simple Wound Cleansing - one wound 0 []  -  0 Complex Wound Cleansing - multiple wounds []  - 0 Wound Imaging (photographs - any number of wounds) []  - 0 Wound Tracing (instead of photographs) []  - 0 Simple Wound Measurement - one wound []  - 0 Complex Wound Measurement - multiple wounds INTERVENTIONS - Wound Dressings []  - Small Wound Dressing one or multiple wounds 0 []  - 0 Medium Wound Dressing one or multiple wounds []  - 0 Large Wound Dressing one or multiple wounds []  - 0 Application of Medications - topical []  - 0 Application of Medications - injection INTERVENTIONS - Miscellaneous []  - External ear exam 0 []  - 0 Specimen Collection (cultures, biopsies, blood, body fluids, etc.) []  - 0 Specimen(s) / Culture(s) sent or taken to Lab for analysis []  - 0 Patient Transfer (multiple staff / Civil Service fast streamer / Similar devices) []  - 0 Simple Staple / Suture removal (25 or less) []  - 0 Complex Staple / Suture removal (26 or more) []  - 0 Hypo / Hyperglycemic Management (close monitor of Blood Glucose) []  - 0 Ankle / Brachial Index (ABI) - do not check if billed separately []  - 0 Vital Signs Has the patient been seen at the hospital within the last three years: Yes Total Score: 0 Level Of Care: ____ Electronic Signature(s) Signed: 07/16/2019 3:08:20 PM By: Army Melia Entered By: Army Melia on 07/16/2019 11:29:58 [name] ([id number]) -------------------------------------------------------------------------------- Encounter Discharge Information Details Patient Name: [name] Date of Service: 07/16/2019 11:00 AM Medical Record Number: [id number] Patient Account Number: [id number] Date of Birth/Sex: 1945/10/21 (73 y.o. M) Treating RN: Army Melia Primary Care Donnovan Stamour: [name] Other  Clinician: Referring Yazmeen Woolf: [name] Treating Chaka Boyson/Extender: Melburn Hake, HOYT Weeks in Treatment: 22 Encounter Discharge Information Items Post Procedure Vitals Discharge Condition: Stable Temperature (F): 98.5 Ambulatory Status: Ambulatory Pulse (bpm): 101 Discharge Destination: Home Respiratory Rate (breaths/min): 16 Transportation: Private Auto Blood Pressure (mmHg): 179/86 Accompanied By: self Schedule Follow-up Appointment: Yes Clinical Summary of Care: Electronic Signature(s) Signed: 07/16/2019 3:08:20 PM By: Army Melia Entered By: Army Melia on 07/16/2019 11:31:02 [name] ([id number]) -------------------------------------------------------------------------------- Lower Extremity Assessment Details Patient Name: [name] Date of Service: 07/16/2019 11:00 AM Medical Record Number: [id number] Patient Account Number: [id number] Date of Birth/Sex: [birth year] (73 y.o. M) Treating RN: Montey Hora Primary Care Rei Medlen: [name] Other Clinician: Referring Coline Calkin: [name] Treating Doloras Tellado/Extender: [name], HOYT Weeks in Treatment: 22 Edema Assessment Assessed: [Left: No] [Right: No] Edema: [Left: N] [Right: o] Vascular Assessment Pulses: Dorsalis Pedis Palpable: [Left:Yes] Electronic Signature(s) Signed: 07/16/2019 4:25:15 PM By: Montey Hora Entered By: Montey Hora on 07/16/2019 11:10:00 [name] ([id number]) -------------------------------------------------------------------------------- Multi Wound Chart Details Patient Name: [name] Date of Service: 07/16/2019 11:00 AM Medical Record Number: [id number] Patient Account Number: [id number] Date of Birth/Sex: [birth year] (73 y.o. M) Treating RN: Army Melia Primary Care Audrianna Driskill: [name] Other Clinician: Referring Jilda Kress: [name] Treating Kona Yusuf/Extender: Melburn Hake, HOYT Weeks in Treatment: 22 Vital Signs Height(in):  71 Pulse(bpm): 101 Weight(lbs): 205 Blood Pressure(mmHg): 179/86 Body Mass Index(BMI): 29 Temperature(F): 98.5 Respiratory Rate(breaths/min): 16 Photos: [N/A:N/A] Wound Location: Left, Lateral Foot N/A N/A Wounding Event: Pressure Injury N/A N/A Primary Etiology: Diabetic Wound/Ulcer of the Lower N/A N/A Extremity Comorbid History: Cataracts, Middle ear problems, N/A N/A Sleep Apnea, Hypertension, Type II Diabetes, Gout, Osteoarthritis, Neuropathy Date Acquired: 01/19/2019 N/A N/A Weeks of Treatment: 22 N/A N/A Wound Status: Open N/A N/A Measurements L x W x D (cm) 0.6x0.4x0.1 N/A N/A Area (cm) : 0.188 N/A N/A Volume (  cm) : 0.019 N/A N/A % Reduction in Area: 31.60% N/A N/A % Reduction in Volume: 76.80% N/A N/A Classification: Grade 2 N/A N/A Exudate Amount: Small N/A N/A Exudate Type: Serosanguineous N/A N/A Exudate Color: red, brown N/A N/A Wound Margin: Flat and Intact N/A N/A Granulation Amount: Large (67-100%) N/A N/A Granulation Quality: Pink N/A N/A Necrotic Amount: None Present (0%) N/A N/A Exposed Structures: Fat Layer (Subcutaneous Tissue) N/A N/A Exposed: Yes Fascia: No Tendon: No Muscle: No Joint: No Bone: No Epithelialization: Medium (34-66%) N/A N/A Treatment Notes Electronic Signature(s) Signed: 07/16/2019 3:08:20 PM By: Mena Goes, Grier Rocher ([id number]) Entered By: Army Melia on 07/16/2019 11:26:06 [name] ([id number]) -------------------------------------------------------------------------------- [facility] Details Patient Name: [name] Date of Service: 07/16/2019 11:00 AM Medical Record Number: [id number] Patient Account Number: [id number] Date of Birth/Sex: 05-07-1945 (73 y.o. M) Treating RN: Army Melia Primary Care Davonta Stroot: [name] Other Clinician: Referring Ralphie Lovelady: [name] Treating Jayro Mcmath/Extender: Melburn Hake, HOYT Weeks in Treatment: 22 Active Inactive Pressure Nursing  Diagnoses: Knowledge deficit related to management of pressures ulcers Goals: Patient will remain free from development of additional pressure ulcers Date Initiated: 04/30/2019 Target Resolution Date: 05/14/2019 Goal Status: Active Patient/caregiver will verbalize understanding of pressure ulcer management Date Initiated: 04/30/2019 Target Resolution Date: 05/14/2019 Goal Status: Active Interventions: Assess: immobility, friction, shearing, incontinence upon admission and as needed Treatment Activities: Patient referred for pressure reduction/relief devices : 04/30/2019 Notes: Wound/Skin Impairment Nursing Diagnoses: Impaired tissue integrity Goals: Ulcer/skin breakdown will have a volume reduction of 30% by week 4 Date Initiated: 04/30/2019 Target Resolution Date: 05/31/2019 Goal Status: Active Interventions: Assess patient/caregiver ability to obtain necessary supplies Assess ulceration(s) every visit Treatment Activities: Referred to DME Johnay Mano for dressing supplies : 04/30/2019 Skin care regimen initiated : 04/30/2019 Topical wound management initiated : 04/30/2019 Notes: Electronic Signature(s) Signed: 07/16/2019 3:08:20 PM By: Army Melia Entered By: Army Melia on 07/16/2019 11:25:58 [name] ([id number]) -------------------------------------------------------------------------------- Pain Assessment Details Patient Name: [name] Date of Service: 07/16/2019 11:00 AM Medical Record Number: [id number] Patient Account Number: [id number] Date of Birth/Sex: 27-Oct-1945 (73 y.o. M) Treating RN: Montey Hora Primary Care Lysbeth Dicola: [name] Other Clinician: Referring Kischa Altice: [name] Treating Chey Rachels/Extender: Melburn Hake, HOYT Weeks in Treatment: 22 Active Problems Location of Pain Severity and Description of Pain Patient Has Paino No Site Locations Pain Management and Medication Current Pain Management: Electronic Signature(s) Signed:  07/16/2019 4:25:15 PM By: Montey Hora Entered By: Montey Hora on 07/16/2019 11:06:19 [name] ([id number]) -------------------------------------------------------------------------------- Patient/Caregiver Education Details Patient Name: [name] Date of Service: 07/16/2019 11:00 AM Medical Record Number: [id number] Patient Account Number: [id number] Date of Birth/Gender: 03/10/1946 (73 y.o. M) Treating RN: Army Melia Primary Care Physician: [name] Other Clinician: Referring Physician: [name] Treating Physician/Extender: Sharalyn Ink in Treatment: 22 Education Assessment Education Provided To: Patient Education Topics Provided Wound/Skin Impairment: Handouts: Caring for Your Ulcer Methods: Demonstration, Explain/Verbal Responses: State content correctly Electronic Signature(s) Signed: 07/16/2019 3:08:20 PM By: Army Melia Entered By: Army Melia on 07/16/2019 11:30:17 [name] ([id number]) -------------------------------------------------------------------------------- Wound Assessment Details Patient Name: [name] Date of Service: 07/16/2019 11:00 AM Medical Record Number: [id number] Patient Account Number: [id number] Date of Birth/Sex: 1945-10-14 (73 y.o. M) Treating RN: Montey Hora Primary Care Fawaz Borquez: [name] Other Clinician: Referring Shana Zavaleta: [name] Treating Daksha Koone/Extender: [name], HOYT Weeks in Treatment: 22 Wound Status Wound Number: 5 Primary Diabetic Wound/Ulcer of the Lower Extremity Etiology: Wound Location: Left, Lateral Foot Wound  Open Wounding Event: Pressure Injury Status: Date Acquired: 01/19/2019 Comorbid Cataracts, Middle ear problems, Sleep Apnea, Weeks Of Treatment: 22 History: Hypertension, Type II Diabetes, Gout, Osteoarthritis, Clustered Wound: No Neuropathy Photos Wound Measurements Length: (cm) 0.6 % Reduc Width: (cm) 0.4 % Reduc Depth: (cm) 0.1  [place] Area: (cm) 0.188 Tunnel Volume: (cm) 0.019 Underm tion in Area: 31.6% tion in Volume: 76.8% ialization: Medium (34-66%) ing: No ining: No Wound Description Classification: Grade 2 Foul Od Wound Margin: Flat and Intact Slough/ Exudate Amount: Small Exudate Type: Serosanguineous Exudate Color: red, brown or After Cleansing: No Fibrino No Wound Bed Granulation Amount: Large (67-100%) Exposed Structure Granulation Quality: Pink Fascia Exposed: No Necrotic Amount: None Present (0%) Fat Layer (Subcutaneous Tissue) Exposed: Yes Tendon Exposed: No Muscle Exposed: No Joint Exposed: No Bone Exposed: No Treatment Notes Wound #5 (Left, Lateral Foot) Notes [employer], telfa Electronic Signature(s) [name], [name] ([id number]) Signed: 07/16/2019 4:25:15 PM By: Montey Hora Entered By: Montey Hora on 07/16/2019 11:09:22 [name] ([id number]) -------------------------------------------------------------------------------- Vitals Details Patient Name: [name] Date of Service: 07/16/2019 11:00 AM Medical Record Number: [id number] Patient Account Number: [id number] Date of Birth/Sex: 1945-10-12 (73 y.o. M) Treating RN: Montey Hora Primary Care Alyjah Lovingood: [name] Other Clinician: Referring Brentin Shin: [name] Treating Bethel Gaglio/Extender: [name], HOYT Weeks in Treatment: 22 Vital Signs Time Taken: 11:05 Temperature (F): 98.5 Height (in): 71 Pulse (bpm): 101 Weight (lbs): 205 Respiratory Rate (breaths/min): 16 Body Mass Index (BMI): 28.6 Blood Pressure (mmHg): 179/86 Reference Range: 80 - 120 mg / dl Electronic Signature(s) Signed: 07/16/2019 4:25:15 PM By: Montey Hora Entered By: Montey Hora on 07/16/2019 11:06:13

## 2019-07-16 NOTE — Progress Notes (Addendum)
CORRIGAN, KRETSCHMER ([id number]) Visit Report for 07/16/2019 Chief Complaint Document Details Patient Name: TAGG, EUSTICE Date of Service: 07/16/2019 11:00 AM Medical Record Number: [id number] Patient Account Number: [id number] Date of Birth/Sex: December 09, 1945 (74 y.o. M) Treating RN: Army Melia Primary Care Provider: [name] Other Clinician: Referring Provider: [name] Treating Provider/Extender: Melburn Hake, Ehren Berisha Weeks in Treatment: 22 Information Obtained from: Patient Chief Complaint Left dorsal foot ulcer 02/09/2019; patient is here for review of the wound on the left lateral foot roughly at the level of the base of the fifth metatarsal Electronic Signature(s) Signed: 07/16/2019 11:21:02 AM By: Worthy Keeler [name] Entered By: Worthy Keeler on 07/16/2019 11:21:02 [name] ([id number]) -------------------------------------------------------------------------------- Debridement Details Patient Name: [name] Date of Service: 07/16/2019 11:00 AM Medical Record Number: [id number] Patient Account Number: [id number] Date of Birth/Sex: 24-Aug-1945 (74 y.o. M) Treating RN: Army Melia Primary Care Provider: [name] Other Clinician: Referring Provider: [name] Treating Provider/Extender: Melburn Hake, Aziz Slape Weeks in Treatment: 22 Debridement Performed for Wound #5 Left,Lateral Foot Assessment: Performed By: Physician [name], Wava Kildow E., [name] Debridement Type: Debridement Severity of Tissue Pre Debridement: Fat layer exposed Level of Consciousness (Pre- Awake and Alert procedure): Pre-procedure Verification/Time Out Yes - 11:25 Taken: Start Time: 11:26 Pain Control: Lidocaine Total Area Debrided (L x W): 0.6 (cm) x 0.4 (cm) = 0.24 (cm) Tissue and other material debrided: Viable, Non-Viable, Callus, Slough, Subcutaneous, Slough Level: Skin/Subcutaneous Tissue Debridement Description: Excisional Instrument: Curette Bleeding:  Minimum Hemostasis Achieved: Pressure End Time: 11:27 Response to Treatment: Procedure was tolerated well Level of Consciousness (Post- Awake and Alert procedure): Post Debridement Measurements of Total Wound Length: (cm) 0.6 Width: (cm) 0.4 Depth: (cm) 0.1 Volume: (cm) 0.019 Character of Wound/Ulcer Post Debridement: Stable Severity of Tissue Post Debridement: Fat layer exposed Post Procedure Diagnosis Same as Pre-procedure Electronic Signature(s) Signed: 07/16/2019 3:08:20 PM By: Army Melia Signed: 07/20/2019 10:31:16 AM By: Worthy Keeler [name] Entered By: Army Melia on 07/16/2019 11:27:02 [name] ([id number]) -------------------------------------------------------------------------------- HPI Details Patient Name: [name] Date of Service: 07/16/2019 11:00 AM Medical Record Number: [id number] Patient Account Number: [id number] Date of Birth/Sex: March 10, 1946 (74 y.o. M) Treating RN: Army Melia Primary Care Provider: [name] Other Clinician: Referring Provider: [name] Treating Provider/Extender: Melburn Hake, Narda Fundora Weeks in Treatment: 22 History of Present Illness HPI Description: 10/04/16 on evaluation today patient presents for initial evaluation concerning a laceration of the right wrist and hand on the bowler aspect. He tells me that he fell into glass following a medication change in regard to his diabetes. He initially went to [facility] where she was sutured and subsequently his daughter who is a Marine scientist removed the teachers at the appropriate time. Unfortunately the wound has done poorly and dehissed which has caused him some trouble including infection. He has been on antibiotics both topically and orally though the wound continues to show signs of necrotic tissue according to notes which is what he was sent to [place] for evaluation concerning. He does have diabetes and unfortunately this is severely uncontrolled. He does see an  endocrinologist and is working on this. 10/11/2016 -- the patient's notes from the ER were reviewed and he had his suturing done on 09/17/2016 and had necrotic skin flaps which had to be trimmed the last visit he was here. He has managed to get his Santyl ointment and has been using this appropriately. 10/21/16 patient's wound appears to be doing very well on evaluation today. He  still has some Slough covering the wound bed but this is nowhere near as severe as when i saw this initially two weeks ago nor even my review of his pictures last week. I'm pleased with how this is progressing. Patient's wound over the right form appears to be doing fairly well on evaluation today. There is no evidence of infection and he has a small area at the crease of where she is wrist extends and flexes that is still open and appears to be having difficulty due to the fact that it is cracking open. I think this is something that may be controlled with moisture control that is keeping the wound a little bit more moist. Nonetheless this is a tiny region compared to the initial wound and overall appears to be doing very well. No fevers, chills, nausea, or vomiting noted at this time. 11/25/16 on evaluation today patient appears to be doing well in regard to his right wrist ulcer. In fact this appears to be completely healed he is having no discomfort. He is pleased with how this is progressed. Readmission: 06/24/17 on evaluation today patient appears back in our clinic regarding issues that he has been having with his right ankle on the lateral malleolus as well is in the dorsal surface of his left foot. Both have been going on for several weeks unfortunately. He tells me that initially this was being managed by Dr. Edilia Bo and I did review the notes from Dr. Edilia Bo in epic. Patient was placed on amoxicillin initially and then subsequently Keflex following. He has been using triple antibiotic ointment over-the-counter  along with a Band-Aid for the wound currently. He did not have any Santyl remaining. His most recent hemoglobin A1c which was just two days ago was 10.3. Fortunately he seems to have excellent blood flow in the bilateral lower extremities with his left ABI being 0.95 in the right ABI 1.01. Patient does have some pain in regard to each of these ulcerations but he tells me it's not nearly as severe as the hand wound that I help treat earlier over the summer. This was in 2018. 07/15/17 on evaluation today patient's lower surety ulcer is actually both appear to be doing fairly well at this point. He does have a little bit of skin overlapping the ulcer on the right lateral ankle this was debrided away today just with saline and gauze without complication patient had no discomfort or pain. Otherwise patient's left foot ulcer does appear to be showing signs of improvement he does seem to have a right great toe. Nikki and noted at this point in time. I think that this does need to be managed at this point. No fevers, chills, nausea, or vomiting noted at this time. 07/22/17 on evaluation today patient's wounds in regard to his lower extremities appear to be doing somewhat better. His right ankle wound in fact appears to likely be completely closed. The left dorsal foot ulcer though still open does appear to be a little bit smaller and does also seem to be making good progress. He has not been having problems with the treatment at this point in the general he does not seem to show as much erythema surrounding the wound bed which is good news. He is not having significant discomfort. 08/12/17 on evaluation today patients wounds of both appeared to be doing better in fact they both appear to be completely healed. Overall I'm very pleased with how things have progressed over the past week even more  than what I expected. Obviously this is good news and he is happy about this. He did have a fall when going to his  orthopedic doctor recently fortunately he does not appear to have injured anything severely but nonetheless does have a couple of bumps and bruises nothing significant at this time but he needs to be seen and wound care for. He may have broken his left wrist he is following up with orthopedics in that regard. Readmission: 10/24/17 on evaluation today patient presents for reevaluation after having been discharged Aug 12, 2017. His wound was healed at that point although he states that it has been somewhat discolored since that time. He has not experienced the drainage as far as the wound is concerned but the fact that it was still discolored been greater than two months after the fact made him want to come in for reevaluation. Upon inspection today the patient has no pain although he does have neuropathy. He does have a discolored region of the dorsal surface of the left foot which was the site where the ulcer was that we previously treated. The good news is he does not again have any discomfort although obviously I do believe this may be some scar tissue and just discoloration in that regard. Again he hasn't had any drainage or discharge since I last saw him and at this point I see no evidence of that either this area shows complete epithelialization. In general I do not feel like there's any significant issue at this point in that regard. ELSIE, SAKUMA ([id number]) 02/09/2019 This is a 74 year old man with type 2 diabetes poorly controlled with significant peripheral neuropathy. He has been in this clinic before in 2018 with a laceration on his right hand and then again in 2019 with an area over the right lateral malleolus. 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 He states that the wound is been there for the last week or 2. It is been uncomfortable to walk on. The man is an active man having to look  after an elderly mother. Past medical history, type 2 diabetes with polyneuropathy with a recent hemoglobin A1c in September 2011 0.3, lumbar disc disease, hypergonadism., Left foot drop and gout. We did not repeat his ABIs in the clinic quoting values from July 2019 of 0.95 and 1.01. He is not felt to have significant PAD 02/19/2019 on evaluation today patient actually appears to be doing better with regard to his foot wound based on what I am seeing currently. He was seen by Dr. Dellia Nims on readmission back into the clinic last week when I was off. The good news is the patient has been tolerating the dressing changes without any complication in fact has not really had much in the way of drainage. He does have an AFO brace which she believes is actually pushing on this area as well and has contributed to the issue. Fortunately there is no signs of active infection at this point. His ABIs appear to be well. 03/05/2019 on evaluation today patient appears to be doing well in regard to his foot ulcer. He has been tolerating the dressing changes without complication. Fortunately there is no signs of active infection. The wound is measuring somewhat smaller and overall I feel like he is making good progress at this point. 03/12/2019 on evaluation today patient actually appears to be doing quite well with  regard to his foot ulcer. The wound is looking better the measurement is not significantly better at this point unfortunately but nonetheless again the overall appearance is improving I think he is making some progress. I still think the big issue here is foot drop and again the patient wants to see if potentially he could get a brace to help with this. Subsequently I think that we can definitely make a referral to [facility] to see if they can help [place] in this regard. He is in agreement with that plan. 03/26/2019 on evaluation today patient appears to be doing more poorly in regard to his foot ulcer at this  time. He did see podiatry in order to see about getting a brace but unfortunately he tells me that Medicare is not likely to pay for one for him as he had 1 I guess 8 months ago. However this one that apparently goes in his shoes that does not seem to be working out well for him. Unfortunately there are signs of active infection at this time. No fevers, chills, nausea, vomiting, or diarrhea. Systemically. The patient has erythema on the dorsal surface of his foot that tracks from the lateral portion of his foot and again he does have a blister around the wound as well which is worse compared to previous. Overall I am concerned. 04/02/2019 on evaluation today patient appears to be doing about the same with regard to his wound. Unfortunately there is no signs of significant improvement although I do think there is no signs of infection which is good news. His drainage is also slowed down tremendously. I think at this point he would benefit from the initiation of a total contact cast. The only concern I have is that over his balance. I explained to the patient that if we were to initiate the cast I think it would be greatly beneficial for him but he would need to ensure not to walk at all unless he uses a walker he actually has 2 walkers one for inside his home and one that he keeps out in the car. For that reason he would be covered in that regard. I just do not want him to have any accidents and fall with the cast. 12/28-Patient comes in 1 week out a day early for his appointment he was in the hospital for 2 days for uncontrolled hyperglycemia and required management of high blood sugars, with some adjustments in his home regimen of insulin, patient stubbed his right great toe today and put a Band-Aid on it, he is here for his left lateral foot ulcer and we are using silver alginate dressing, he was intact total contact cast with difficulty with balance to the extent that that had to be  discontinued 04/16/2019 on evaluation today patient appears to be doing better with regard to the overall appearance of his wound size. This is good news. We had attempted a total contact cast but unfortunately he had some falling issues that the family contacted [place] about we took the cast off we did not put it back on. Nonetheless it ended up that just a couple days later he was in the hospital due to elevations in his blood sugar which went to around 500 and even a little higher. Subsequently he was admitted to the hospital and was in the hospital for a total of 3 days and that included Christmas day. Fortunately there got his blood sugar under control he tells me that this is running about  200 now which is much better news. There does not appear to be any signs of active infection at this time. No fevers, chills, nausea, vomiting, or diarrhea. 04/30/2019 on evaluation today patient actually appears to be doing slightly worse compared to previous as far as the overall size of his wound is concerned. There also is some need for sharp debridement today as well. Fortunately there is no evidence of active infection which is good news. No fevers, chills, nausea, vomiting, or diarrhea. 05/07/2019 upon evaluation today patient appears to be doing excellent in regard to his plantar foot ulcer. He has been tolerating the dressing changes without complication. Fortunately there is no signs of active infection at this time. No fevers, chills, nausea, vomiting, or diarrhea. He does tell me that he actually got in touch with [employer] and they feel like they can add diagnosis codes to his orders in order to get an external brace for him as opposed to the one that goes internal to issue. That would be excellent for his foot drop and I did tell him that if there is anything I need to fill out in that regard I will be more than happy to oblige and fill out what is needed for [employer]. 05/14/2019 upon evaluation today  patient's original wound site actually appears to be doing much better which is good news. Fortunately there is no signs of active infection at this time. No fevers, chills, nausea, vomiting, or diarrhea. With that being said the patient unfortunately is continuing to have issues with rubbing he tells me that the felt slid on his foot although he did not realize it until today. Nonetheless I think this did cause a blister around the edge of the wound and he does an open wound that is slightly larger overall in measurement compared to last time mainly due to this blister. Other than that I feel like he is doing quite well. 05/21/2019 upon evaluation today patient appears to be doing excellent in regard to his wound at this time on the plantar foot. He is showing signs of improvement he does have some slough and biofilm noted on the surface of the wound this is going require some sharp debridement today as well. Fortunately there is no evidence of active infection. No fevers, chills, nausea, vomiting, or diarrhea. 05/28/2019 on evaluation today patient's wound actually appears to be measuring smaller upon initial inspection and I do believe that it is doing well. QUINDON, DENKER ([id number]) With that being said he continues to have a blister on the outside/lateral edge of the wound that occurs as a result of rubbing due to his dropfoot currently. Subsequently we have filled out the paperwork for his new external brace but again that does take several weeks to get that completed once everything is returned to back we did send this back last week as far as the paperwork was concerned. With that being said there does not appear to be any signs of active infection at this time. 06/04/2019 on evaluation today patient's wound actually appears to be doing slightly better compared to last week which is good news. He still states that his insurance unfortunately does not seem to want to cover any of his new brace.  He is going to just pay for this himself. Nonetheless he is going by there today when he leaves here that is [employer] in [place] that is going to be making this brace for him. 06/18/2019 upon evaluation today patient fortunately does have his  new brace which is the external AFO brace with bilateral uprights. Subsequently he seems to be doing much better in fact the wound already appears better. He has not even been using the felt he is just using a dressing over top of the wound and overall I am very pleased with how things seem to be progressing. There is no signs of active infection at this time which is great news and I think that the AFO is good to help him tremendously as far as trying to keep pressure off of the 06/25/2019 upon evaluation today patient seems to be making good progress in regard to his wound. He has been tolerating the dressing changes without complication. Fortunately there is no signs of active infection at this time. No fevers, chills, nausea, vomiting, or diarrhea. 07/02/2019 upon evaluation today patient unfortunately had a blistered area on the bottom of his foot tracking more towards the midline of the plantar aspect. He does not seem to have issues with rubbing more laterally that he used to have but nonetheless this is still giving him some trouble and causing problems here. Fortunately there is no signs of active infection at this time. No fevers, chills, nausea, vomiting, or diarrhea. 07/09/2019 upon evaluation today patient appears to be doing well with regard to his plantar foot ulcer. Definitely this is much improved compared to last week's visit which again last week the issue there was that he had an area of callus buildup on the plantar aspect of his foot I was able to clean this away and after effectively doing so the wound seems to be doing much better which is great news. There is no signs of active infection at this time. 07/16/2019 upon evaluation today patient  appears to be making some progress in my opinion with regard to his plantar foot ulcer. He has been tolerating the dressing changes without complication. Fortunately there is no evidence of active infection at this time which is great news. Electronic Signature(s) Signed: 07/16/2019 1:48:26 PM By: Worthy Keeler [name] Entered By: Worthy Keeler on 07/16/2019 13:48:25 [name] ([id number]) -------------------------------------------------------------------------------- Physical Exam Details Patient Name: [name] Date of Service: 07/16/2019 11:00 AM Medical Record Number: [id number] Patient Account Number: [id number] Date of Birth/Sex: 20-Jul-1945 (74 y.o. M) Treating RN: Army Melia Primary Care Provider: [name] Other Clinician: Referring Provider: [name] Treating Provider/Extender: [name], Darwin Guastella Weeks in Treatment: 84 Constitutional Well-nourished and well-hydrated in no acute distress. Respiratory normal breathing without difficulty. Psychiatric this patient is able to make decisions and demonstrates good insight into disease process. Alert and Oriented x 3. pleasant and cooperative. Notes Patient's wound bed currently showed signs of excellent granulation with only minimal slough buildup noted on the surface of the wound. In general he seems to be tolerating the dressing changes and I do believe is making good progress at this time which is great news. Overall I am extremely pleased with where things stand. I did have to perform sharp debridement however did clear away some of the callus and slough from the surface of the wound and post debridement the wound bed appears to be doing much better in all regard. Electronic Signature(s) Signed: 07/16/2019 1:49:26 PM By: Worthy Keeler [name] Previous Signature: 07/16/2019 1:49:10 PM Version By: Worthy Keeler [name] Entered By: Worthy Keeler on 07/16/2019 13:49:26 [name]  ([id number]) -------------------------------------------------------------------------------- Physician Orders Details Patient Name: [name] Date of Service: 07/16/2019 11:00 AM Medical Record Number: [id number]  Patient Account Number: [id number] Date of Birth/Sex: [birth year] (74 y.o. M) Treating RN: Army Melia Primary Care Provider: [name] Other Clinician: Referring Provider: [name] Treating Provider/Extender: Melburn Hake, Jarmar Rousseau Weeks in Treatment: 22 Verbal / Phone Orders: No Diagnosis Coding ICD-10 Coding Code Description E11.621 Type 2 diabetes mellitus with foot ulcer E11.42 Type 2 diabetes mellitus with diabetic polyneuropathy L97.522 Non-pressure chronic ulcer of other part of left foot with fat layer exposed M21.372 Foot drop, left foot Wound Cleansing Wound #5 Left,Lateral Foot o May shower with protection. Anesthetic (add to Medication List) Wound #5 Left,Lateral Foot o Topical Lidocaine 4% cream applied to wound bed prior to debridement (In Clinic Only). Primary Wound Dressing Wound #5 Left,Lateral Foot o Silver Alginate Secondary Dressing Wound #5 [place] Dressing Change Frequency Wound #5 Left,Lateral Foot o Change dressing every other day. Follow-up Appointments Wound #5 Left,Lateral Foot o Return Appointment in 1 week. Edema Control Wound #5 Left,Lateral Foot o Elevate legs to the level of the heart and pump ankles as often as possible Additional Orders / Instructions Wound #5 Left,Lateral Foot o Other: - Patient to follow-up with [employer] for adjustments to braces and shoes. Requested paperwork sent to [employer]. Electronic Signature(s) Signed: 07/16/2019 3:08:20 PM By: Army Melia Signed: 07/20/2019 10:31:16 AM By: Worthy Keeler [name] Entered By: Army Melia on 07/16/2019 11:28:22 [name]  ([id number]) -------------------------------------------------------------------------------- Problem List Details Patient Name: [name] Date of Service: 07/16/2019 11:00 AM Medical Record Number: [id number] Patient Account Number: [id number] Date of Birth/Sex: 05-06-1945 (74 y.o. M) Treating RN: Army Melia Primary Care Provider: [name] Other Clinician: Referring Provider: [name] Treating Provider/Extender: Melburn Hake, Marlaysia Lenig Weeks in Treatment: 22 Active Problems ICD-10 Evaluated Encounter Code Description Active Date Today Diagnosis E11.621 Type 2 diabetes mellitus with foot ulcer 02/09/2019 No Yes E11.42 Type 2 diabetes mellitus with diabetic polyneuropathy 02/09/2019 No Yes L97.522 Non-pressure chronic ulcer of other part of left foot with fat layer exposed 02/09/2019 No Yes M21.372 Foot drop, left foot 02/09/2019 No Yes Inactive Problems Resolved Problems Electronic Signature(s) Signed: 07/16/2019 11:20:06 AM By: Worthy Keeler [name] Entered By: Worthy Keeler on 07/16/2019 11:20:04 [name] ([id number]) -------------------------------------------------------------------------------- Progress Note Details Patient Name: [name] Date of Service: 07/16/2019 11:00 AM Medical Record Number: [id number] Patient Account Number: [id number] Date of Birth/Sex: 01-04-1946 (74 y.o. M) Treating RN: Army Melia Primary Care Provider: [name] Other Clinician: Referring Provider: [name] Treating Provider/Extender: Melburn Hake, Weber Monnier Weeks in Treatment: 22 Subjective Chief Complaint Information obtained from Patient Left dorsal foot ulcer 02/09/2019; patient is here for review of the wound on the left lateral foot roughly at the level of the base of the fifth metatarsal History of Present Illness (HPI) 10/04/16 on evaluation today patient presents for initial evaluation concerning a laceration of the right wrist and hand on the bowler  aspect. He tells me that he fell into glass following a medication change in regard to his diabetes. He initially went to [facility] where she was sutured and subsequently his daughter who is a Marine scientist removed the teachers at the appropriate time. Unfortunately the wound has done poorly and dehissed which has caused him some trouble including infection. He has been on antibiotics both topically and orally though the wound continues to show signs of necrotic tissue according to notes which is what he was sent to [place] for evaluation concerning. He does have diabetes and unfortunately this is severely uncontrolled. He does  see an endocrinologist and is working on this. 10/11/2016 -- the patient's notes from the ER were reviewed and he had his suturing done on 09/17/2016 and had necrotic skin flaps which had to be trimmed the last visit he was here. He has managed to get his Santyl ointment and has been using this appropriately. 10/21/16 patient's wound appears to be doing very well on evaluation today. He still has some Slough covering the wound bed but this is nowhere near as severe as when i saw this initially two weeks ago nor even my review of his pictures last week. I'm pleased with how this is progressing. Patient's wound over the right form appears to be doing fairly well on evaluation today. There is no evidence of infection and he has a small area at the crease of where she is wrist extends and flexes that is still open and appears to be having difficulty due to the fact that it is cracking open. I think this is something that may be controlled with moisture control that is keeping the wound a little bit more moist. Nonetheless this is a tiny region compared to the initial wound and overall appears to be doing very well. No fevers, chills, nausea, or vomiting noted at this time. 11/25/16 on evaluation today patient appears to be doing well in regard to his right wrist ulcer. In fact this appears to  be completely healed he is having no discomfort. He is pleased with how this is progressed. Readmission: 06/24/17 on evaluation today patient appears back in our clinic regarding issues that he has been having with his right ankle on the lateral malleolus as well is in the dorsal surface of his left foot. Both have been going on for several weeks unfortunately. He tells me that initially this was being managed by Dr. Edilia Bo and I did review the notes from Dr. Edilia Bo in epic. Patient was placed on amoxicillin initially and then subsequently Keflex following. He has been using triple antibiotic ointment over-the-counter along with a Band-Aid for the wound currently. He did not have any Santyl remaining. His most recent hemoglobin A1c which was just two days ago was 10.3. Fortunately he seems to have excellent blood flow in the bilateral lower extremities with his left ABI being 0.95 in the right ABI 1.01. Patient does have some pain in regard to each of these ulcerations but he tells me it's not nearly as severe as the hand wound that I help treat earlier over the summer. This was in 2018. 07/15/17 on evaluation today patient's lower surety ulcer is actually both appear to be doing fairly well at this point. He does have a little bit of skin overlapping the ulcer on the right lateral ankle this was debrided away today just with saline and gauze without complication patient had no discomfort or pain. Otherwise patient's left foot ulcer does appear to be showing signs of improvement he does seem to have a right great toe. Nikki and noted at this point in time. I think that this does need to be managed at this point. No fevers, chills, nausea, or vomiting noted at this time. 07/22/17 on evaluation today patient's wounds in regard to his lower extremities appear to be doing somewhat better. His right ankle wound in fact appears to likely be completely closed. The left dorsal foot ulcer though still open  does appear to be a little bit smaller and does also seem to be making good progress. He has not been having  problems with the treatment at this point in the general he does not seem to show as much erythema surrounding the wound bed which is good news. He is not having significant discomfort. 08/12/17 on evaluation today patients wounds of both appeared to be doing better in fact they both appear to be completely healed. Overall I'm very pleased with how things have progressed over the past week even more than what I expected. Obviously this is good news and he is happy about this. He did have a fall when going to his orthopedic doctor recently fortunately he does not appear to have injured anything severely but nonetheless does have a couple of bumps and bruises nothing significant at this time but he needs to be seen and wound care for. He may have broken his left wrist he is following up with orthopedics in that regard. Readmission: 10/24/17 on evaluation today patient presents for reevaluation after having been discharged Aug 12, 2017. His wound was healed at that point although he states that it has been somewhat discolored since that time. He has not experienced the drainage as far as the wound is concerned but the fact that it was still discolored been greater than two months after the fact made him want to come in for reevaluation. Upon inspection today the patient has no pain although he does have neuropathy. He does have a discolored region of the dorsal surface of the left foot which was the site where the ulcer was MONTAE, STAGER. ([id number]) that we previously treated. The good news is he does not again have any discomfort although obviously I do believe this may be some scar tissue and just discoloration in that regard. Again he hasn't had any drainage or discharge since I last saw him and at this point I see no evidence of that either this area shows complete epithelialization. In  general I do not feel like there's any significant issue at this point in that regard. 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 He has been in this clinic before in 2018 with a laceration on his right hand and then again in 2019 with an area over the right lateral malleolus. 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 He states that the wound is been there for the last week or 2. It is been uncomfortable to walk on. The man is an active man having to look after an elderly mother. Past medical history, type 2 diabetes with polyneuropathy with a recent hemoglobin A1c in September 2011 0.3, lumbar disc disease, hypergonadism., Left foot drop and gout. We did not repeat his ABIs in the clinic quoting values from July 2019 of 0.95 and 1.01. He is not felt to have significant PAD 02/19/2019 on evaluation today patient actually appears to be doing better with regard to his foot wound based on what I am seeing currently. He was seen by Dr. Dellia Nims on readmission back into the clinic last week when I was off. The good news is the patient has been tolerating the dressing changes without any complication in fact has not really had much in the way of drainage. He does have an AFO brace which she believes is actually pushing on this area as well and has contributed to the issue. Fortunately there is no signs of active infection at this point. His ABIs  appear to be well. 03/05/2019 on evaluation today patient appears to be doing well in regard to his foot ulcer. He has been tolerating the dressing changes without complication. Fortunately there is no signs of active infection. The wound is measuring somewhat smaller and overall I feel like he is making good progress at this point. 03/12/2019 on evaluation today patient actually appears to be doing  quite well with regard to his foot ulcer. The wound is looking better the measurement is not significantly better at this point unfortunately but nonetheless again the overall appearance is improving I think he is making some progress. I still think the big issue here is foot drop and again the patient wants to see if potentially he could get a brace to help with this. Subsequently I think that we can definitely make a referral to [facility] to see if they can help [place] in this regard. He is in agreement with that plan. 03/26/2019 on evaluation today patient appears to be doing more poorly in regard to his foot ulcer at this time. He did see podiatry in order to see about getting a brace but unfortunately he tells me that Medicare is not likely to pay for one for him as he had 1 I guess 8 months ago. However this one that apparently goes in his shoes that does not seem to be working out well for him. Unfortunately there are signs of active infection at this time. No fevers, chills, nausea, vomiting, or diarrhea. Systemically. The patient has erythema on the dorsal surface of his foot that tracks from the lateral portion of his foot and again he does have a blister around the wound as well which is worse compared to previous. Overall I am concerned. 04/02/2019 on evaluation today patient appears to be doing about the same with regard to his wound. Unfortunately there is no signs of significant improvement although I do think there is no signs of infection which is good news. His drainage is also slowed down tremendously. I think at this point he would benefit from the initiation of a total contact cast. The only concern I have is that over his balance. I explained to the patient that if we were to initiate the cast I think it would be greatly beneficial for him but he would need to ensure not to walk at all unless he uses a walker he actually has 2 walkers one for inside his home and one that he keeps out  in the car. For that reason he would be covered in that regard. I just do not want him to have any accidents and fall with the cast. 12/28-Patient comes in 1 week out a day early for his appointment he was in the hospital for 2 days for uncontrolled hyperglycemia and required management of high blood sugars, with some adjustments in his home regimen of insulin, patient stubbed his right great toe today and put a Band-Aid on it, he is here for his left lateral foot ulcer and we are using silver alginate dressing, he was intact total contact cast with difficulty with balance to the extent that that had to be discontinued 04/16/2019 on evaluation today patient appears to be doing better with regard to the overall appearance of his wound size. This is good news. We had attempted a total contact cast but unfortunately he had some falling issues that the family contacted [place] about we took the cast off we did not put it back on. Nonetheless it  ended up that just a couple days later he was in the hospital due to elevations in his blood sugar which went to around 500 and even a little higher. Subsequently he was admitted to the hospital and was in the hospital for a total of 3 days and that included Christmas day. Fortunately there got his blood sugar under control he tells me that this is running about 200 now which is much better news. There does not appear to be any signs of active infection at this time. No fevers, chills, nausea, vomiting, or diarrhea. 04/30/2019 on evaluation today patient actually appears to be doing slightly worse compared to previous as far as the overall size of his wound is concerned. There also is some need for sharp debridement today as well. Fortunately there is no evidence of active infection which is good news. No fevers, chills, nausea, vomiting, or diarrhea. 05/07/2019 upon evaluation today patient appears to be doing excellent in regard to his plantar foot ulcer. He has been  tolerating the dressing changes without complication. Fortunately there is no signs of active infection at this time. No fevers, chills, nausea, vomiting, or diarrhea. He does tell me that he actually got in touch with [employer] and they feel like they can add diagnosis codes to his orders in order to get an external brace for him as opposed to the one that goes internal to issue. That would be excellent for his foot drop and I did tell him that if there is anything I need to fill out in that regard I will be more than happy to oblige and fill out what is needed for [employer]. 05/14/2019 upon evaluation today patient's original wound site actually appears to be doing much better which is good news. Fortunately there is no signs of active infection at this time. No fevers, chills, nausea, vomiting, or diarrhea. With that being said the patient unfortunately is continuing to have issues with rubbing he tells me that the felt slid on his foot although he did not realize it until today. Nonetheless I think this did cause a blister around the edge of the wound and he does an open wound that is slightly larger overall in measurement compared to last time mainly due to this blister. Other than that I feel like he is doing quite well. KALUP, JAQUITH ([id number]) 05/21/2019 upon evaluation today patient appears to be doing excellent in regard to his wound at this time on the plantar foot. He is showing signs of improvement he does have some slough and biofilm noted on the surface of the wound this is going require some sharp debridement today as well. Fortunately there is no evidence of active infection. No fevers, chills, nausea, vomiting, or diarrhea. 05/28/2019 on evaluation today patient's wound actually appears to be measuring smaller upon initial inspection and I do believe that it is doing well. With that being said he continues to have a blister on the outside/lateral edge of the wound that occurs as a result  of rubbing due to his dropfoot currently. Subsequently we have filled out the paperwork for his new external brace but again that does take several weeks to get that completed once everything is returned to back we did send this back last week as far as the paperwork was concerned. With that being said there does not appear to be any signs of active infection at this time. 06/04/2019 on evaluation today patient's wound actually appears to be doing slightly better compared to  last week which is good news. He still states that his insurance unfortunately does not seem to want to cover any of his new brace. He is going to just pay for this himself. Nonetheless he is going by there today when he leaves here that is [employer] in [place] that is going to be making this brace for him. 06/18/2019 upon evaluation today patient fortunately does have his new brace which is the external AFO brace with bilateral uprights. Subsequently he seems to be doing much better in fact the wound already appears better. He has not even been using the felt he is just using a dressing over top of the wound and overall I am very pleased with how things seem to be progressing. There is no signs of active infection at this time which is great news and I think that the AFO is good to help him tremendously as far as trying to keep pressure off of the 06/25/2019 upon evaluation today patient seems to be making good progress in regard to his wound. He has been tolerating the dressing changes without complication. Fortunately there is no signs of active infection at this time. No fevers, chills, nausea, vomiting, or diarrhea. 07/02/2019 upon evaluation today patient unfortunately had a blistered area on the bottom of his foot tracking more towards the midline of the plantar aspect. He does not seem to have issues with rubbing more laterally that he used to have but nonetheless this is still giving him some trouble and causing problems  here. Fortunately there is no signs of active infection at this time. No fevers, chills, nausea, vomiting, or diarrhea. 07/09/2019 upon evaluation today patient appears to be doing well with regard to his plantar foot ulcer. Definitely this is much improved compared to last week's visit which again last week the issue there was that he had an area of callus buildup on the plantar aspect of his foot I was able to clean this away and after effectively doing so the wound seems to be doing much better which is great news. There is no signs of active infection at this time. 07/16/2019 upon evaluation today patient appears to be making some progress in my opinion with regard to his plantar foot ulcer. He has been tolerating the dressing changes without complication. Fortunately there is no evidence of active infection at this time which is great news. Objective Constitutional Well-nourished and well-hydrated in no acute distress. Vitals Time Taken: 11:05 AM, Height: 71 in, Weight: 205 lbs, BMI: 28.6, Temperature: 98.5 F, Pulse: 101 bpm, Respiratory Rate: 16 breaths/min, Blood Pressure: 179/86 mmHg. Respiratory normal breathing without difficulty. Psychiatric this patient is able to make decisions and demonstrates good insight into disease process. Alert and Oriented x 3. pleasant and cooperative. General Notes: Patient's wound bed currently showed signs of excellent granulation with only minimal slough buildup noted on the surface of the wound. In general he seems to be tolerating the dressing changes and I do believe is making good progress at this time which is great news. Overall I am extremely pleased with where things stand. I did have to perform sharp debridement however did clear away some of the callus and slough from the surface of the wound and post debridement the wound bed appears to be doing much better in all regard. Integumentary (Hair, Skin) Wound #5 status is Open. Original cause of  wound was Pressure Injury. The wound is located on the Left,Lateral Foot. The wound measures 0.6cm length x 0.4cm width x  0.1cm depth; 0.188cm^2 area and 0.019cm^3 volume. There is Fat Layer (Subcutaneous Tissue) Exposed exposed. There is no tunneling or undermining noted. There is a small amount of serosanguineous drainage noted. The wound margin is flat and intact. There is large (67-100%) pink granulation within the wound bed. There is no necrotic tissue within the wound bed. 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 There was a Excisional Skin/Subcutaneous Tissue Debridement with a total area of 0.24 sq cm performed by [name], Darilyn Storbeck E., [name]. With the following instrument(s): Curette to remove Viable and Non-Viable tissue/material. Material removed includes Callus, Subcutaneous Tissue, and Slough after achieving pain control using Lidocaine. A time out was conducted at 11:25, prior to the start of the procedure. A Minimum amount of bleeding was controlled with Pressure. The procedure was tolerated well. Post Debridement Measurements: 0.6cm length x 0.4cm width x 0.1cm depth; 0.019cm^3 volume. Character of Wound/Ulcer Post Debridement is stable. Severity of Tissue Post Debridement is: Fat layer exposed. Post procedure Diagnosis Wound #5: Same as Pre-Procedure Plan Wound Cleansing: Wound #5 Left,Lateral Foot: May shower with protection. Anesthetic (add to Medication List): Wound #5 Left,Lateral Foot: Topical Lidocaine 4% cream applied to wound bed prior to debridement (In Clinic  Only). Primary Wound Dressing: Wound #5 Left,Lateral Foot: Silver Alginate Secondary Dressing: Wound #5 Left,Lateral Foot: [place] Dressing Change Frequency: Wound #5 Left,Lateral Foot: Change dressing every other day. Follow-up Appointments: Wound #5 Left,Lateral Foot: Return Appointment in 1 week. Edema Control: Wound #5 Left,Lateral Foot: Elevate legs to the level of the heart and pump ankles as often as possible Additional Orders / Instructions: Wound #5 Left,Lateral Foot: Other: - Patient to follow-up with [employer] for adjustments to braces and shoes. Requested paperwork sent to [employer]. 1. My suggestion at this time is can be that we go ahead and continue with the current wound care measures specifically as they pertain to keeping the area from becoming too moist which I think the silver alginate has done. We will continue as such. 2. I am also can recommend that we continue with a [place] dressing that also seems to be doing well for the patient. 3. I am also can recommend that he continue to use his custom shoe with his foot drop brace which seems to be doing well for him. We will see patient back for reevaluation in 1 week here in the clinic. If anything worsens or changes patient will contact our office for additional recommendations. ARMAAN, POND ([id number]) Electronic Signature(s) Signed: 07/16/2019 1:50:43 PM By: Worthy Keeler [name] Entered By: Worthy Keeler on 07/16/2019 13:50:42 RAYJON, WERY ([id number]) -------------------------------------------------------------------------------- SuperBill Details Patient Name: [name] Date of Service: 07/16/2019 Medical Record Number: [id number] Patient Account Number: [id number] Date of Birth/Sex: [birth year] (74 y.o. M) Treating RN: Army Melia Primary Care Provider: [name] Other Clinician: Referring Provider: [name] Treating Provider/Extender: Melburn Hake, Ambers Iyengar Weeks in  Treatment: 22 Diagnosis Coding ICD-10 Codes Code Description E11.621 Type 2 diabetes mellitus with foot ulcer E11.42 Type 2 diabetes mellitus with diabetic polyneuropathy L97.522 Non-pressure chronic ulcer of other part of left foot with fat layer exposed M21.372  Foot drop, left foot Facility Procedures CPT4 Code: 11643539 Description: 12258 - DEB SUBQ TISSUE 20 SQ CM/< Modifier: Quantity: 1 CPT4 Code: Description: ICD-10 Diagnosis Description L97.522 Non-pressure chronic ulcer of other part of left foot with fat layer exposed Modifier: Quantity: Physician Procedures CPT4 Code: 3462194 Description: 71252 - WC PHYS SUBQ TISS 20 SQ CM Modifier: Quantity: 1 CPT4 Code: Description: ICD-10 Diagnosis Description L97.522 Non-pressure chronic ulcer of other part of left foot with fat layer exposed Modifier: Quantity: Electronic Signature(s) Signed: 07/16/2019 1:51:07 PM By: Worthy Keeler [name] Entered By: Worthy Keeler on 07/16/2019 13:51:02

## 2019-07-17 NOTE — Progress Notes (Signed)
Dustin Milne T. Rania Prothero, MD, [place] at [facility] [place], [address]  Phone: [phone]  FAX: [phone]  [name] - 74 y.o. male  [name] [id number]  Date of Birth: 1946-02-22  Date: 07/18/2019  PCP: [name], MD  Referral: [name], MD  Chief Complaint  Patient presents with  . Follow-up    3 month    This visit occurred during the SARS-CoV-2 public health emergency.  Safety protocols were in place, including screening questions prior to the visit, additional usage of staff PPE, and extensive cleaning of exam room while observing appropriate contact time as indicated for disinfecting solutions.   Subjective:   [name] is a 74 y.o. very pleasant male patient who presents with the following:  He is a fairly unhealthy I with poorly controlled diabetes, active wounds being treated by wound management.  Likely endocrinology is now helping with his poorly controlled diabetes.  He also has high blood pressure, obesity, diabetic neuropathy, high cholesterol, as well as mild cognitive impairment.  He also had some poor balance.  F/u test PSA Test level  Sugar is still doing poorly.  Doing a little better. Around 200  Wound is still doing better.   Has had Covid-19 vaccine  Is also having some issues with his bladder, going often 3 times at night.  He sometimes feels as if he cannot make it to the bathroom when he needs to go.  This is all urine.  Review of Systems is noted in the HPI, as appropriate  Objective:   BP 130/66   Pulse 90   Temp 98 F (36.7 C) (Temporal)   Ht 5' 11.5" (1.816 m)   Wt 202 lb 4 oz (91.7 kg)   SpO2 98%   BMI 27.82 kg/m   GEN: No acute distress; alert,appropriate. PULM: Breathing comfortably in no respiratory distress PSYCH: Normally interactive.   Laboratory and Imaging Data:  Assessment and Plan:    ICD-10-CM   1. Essential hypertension  I10   2. Former very heavy cigarette smoker (more than 40 per day)  Z87.891   3. Diabetic polyneuropathy associated with diabetes mellitus due to underlying condition (HCC)  E08.42   4. Stage 3a chronic kidney disease  N18.31   5. Hypogonadism male  E29.1 PSA, Total with Reflex to PSA, Free    Testosterone  6. Mild cognitive impairment  G31.84   7. Long-term use of high-risk medication  Z79.899 PSA, Total with Reflex to PSA, Free    Testosterone   BP stable, diabetes is doing better.  Appreciate endocrine help.  Hypogonadism follow-up PSA and testosterone.  Trial of Flomax for urine.  Follow-up: No follow-ups on file.  Meds ordered this encounter  Medications  . tamsulosin (FLOMAX) 0.4 MG CAPS capsule    Sig: Take 1 capsule (0.4 mg total) by mouth daily.    Dispense:  30 capsule    Refill:  3   There are no discontinued medications. Orders Placed This Encounter  Procedures  . PSA, Total with Reflex to PSA, Free  . Testosterone    Signed,  Frederico Hamman T. Albert Devaul, MD   Outpatient Encounter Medications as of 07/18/2019  Medication Sig  . acetaminophen (QC ACETAMINOPHEN 8HR ARTH PAIN) 650 MG CR tablet Take 650 mg by mouth every 8 (eight) hours as needed for pain.  [name] Kitchen ampicillin (PRINCIPEN) 500 MG capsule Take 500 mg by mouth  daily.  . Ascorbic Acid (VITAMIN C) 1000 MG tablet Take 1,000 mg by mouth daily.  [name] Kitchen aspirin EC 81 MG tablet Take 81 mg by mouth at bedtime.  [name] Kitchen atenolol (TENORMIN) 50 MG tablet TAKE 1 TABLET BY MOUTH EVERY DAY  . cholecalciferol (VITAMIN D) 1000 UNITS tablet Take 1,000 Units by mouth daily.    . colchicine 0.6 MG tablet Take 1 tablet (0.6 mg total) by mouth 2 (two) times daily.  [name] Kitchen donepezil (ARICEPT) 10 MG tablet Take 1 tablet (10 mg total) by mouth at bedtime.  [name] Kitchen doxazosin (CARDURA) 8 MG tablet TAKE ONE-HALF TABLET AT BEDTIME  . feeding supplement, GLUCERNA SHAKE, (GLUCERNA SHAKE) LIQD Take 237 mLs by mouth 3 (three)  times daily between meals.  . finasteride (PROSCAR) 5 MG tablet Take 1 tablet (5 mg total) by mouth daily.  . fluconazole (DIFLUCAN) 100 MG tablet Take 1 tablet (100 mg total) by mouth daily.  . fluticasone (FLONASE) 50 MCG/ACT nasal spray PLACE 2 SPRAYS IN EACH NOSTRIL DAILY  . ibuprofen (ADVIL,MOTRIN) 200 MG tablet Take 200-400 mg by mouth every 6 (six) hours as needed for headache (pain).  . indomethacin (INDOCIN) 50 MG capsule Take 1 capsule (50 mg total) by mouth 2 (two) times daily with a meal.  . insulin NPH Human (HUMULIN N,NOVOLIN N) 100 UNIT/ML injection Inject 20 Units into the skin 2 (two) times daily before a meal.   . insulin regular (NOVOLIN R,HUMULIN R) 100 units/mL injection Inject 0.2 mLs (20 Units total) into the skin 2 (two) times daily before a meal.  . lisinopril (ZESTRIL) 10 MG tablet TAKE 1 TABLET BY MOUTH EVERY DAY  . memantine (NAMENDA) 10 MG tablet Take 1 tablet (10 mg total) by mouth 2 (two) times daily.  . nutrition supplement, JUVEN, (JUVEN) PACK Take 1 packet by mouth 2 (two) times daily between meals.  [name] Kitchen omeprazole (PRILOSEC) 40 MG capsule Take 1 capsule (40 mg total) by mouth 2 (two) times daily before a meal.  . ondansetron (ZOFRAN) 4 MG tablet Take 1 tablet (4 mg total) by mouth every 8 (eight) hours as needed for nausea or vomiting.  . ondansetron (ZOFRAN-ODT) 8 MG disintegrating tablet Take 1 tablet (8 mg total) by mouth 2 (two) times daily.  . Semaglutide,0.25 or 0.5MG /DOS, (OZEMPIC, 0.25 OR 0.5 MG/DOSE,) 2 MG/1.5ML SOPN Inject 0.5 mg into the skin once a week.  . sildenafil (REVATIO) 20 MG tablet Generic Revatio / Sildanefil 20 mg. 2 - 5 tabs 30 mins prior to intercourse.  . simvastatin (ZOCOR) 40 MG tablet TAKE 1 TABLET BY MOUTH AT BEDTIME  . valACYclovir (VALTREX) 1000 MG tablet Take 1,000 mg by mouth 3 (three) times daily.  [name] Kitchen venlafaxine XR (EFFEXOR-XR) 75 MG 24 hr capsule TAKE 1 CAPSULE BY MOUTH EVERY MORNING AND 2 CAPSULES AT BEDTIME  . tamsulosin  (FLOMAX) 0.4 MG CAPS capsule Take 1 capsule (0.4 mg total) by mouth daily.   Facility-Administered Encounter Medications as of 07/18/2019  Medication  . testosterone cypionate (DEPOTESTOSTERONE CYPIONATE) injection 150 mg  . testosterone cypionate (DEPOTESTOTERONE CYPIONATE) injection 150 mg

## 2019-07-18 ENCOUNTER — Other Ambulatory Visit: Payer: Self-pay

## 2019-07-18 ENCOUNTER — Ambulatory Visit (INDEPENDENT_AMBULATORY_CARE_PROVIDER_SITE_OTHER): Payer: Medicare Other | Admitting: Family Medicine

## 2019-07-18 ENCOUNTER — Ambulatory Visit: Payer: Medicare Other | Admitting: Family Medicine

## 2019-07-18 ENCOUNTER — Encounter: Payer: Self-pay | Admitting: Family Medicine

## 2019-07-18 ENCOUNTER — Ambulatory Visit: Payer: Medicare Other | Attending: Internal Medicine

## 2019-07-18 VITALS — BP 130/66 | HR 90 | Temp 98.0°F | Ht 71.5 in | Wt 202.2 lb

## 2019-07-18 DIAGNOSIS — I10 Essential (primary) hypertension: Secondary | ICD-10-CM | POA: Diagnosis not present

## 2019-07-18 DIAGNOSIS — Z79899 Other long term (current) drug therapy: Secondary | ICD-10-CM | POA: Diagnosis not present

## 2019-07-18 DIAGNOSIS — E291 Testicular hypofunction: Secondary | ICD-10-CM

## 2019-07-18 DIAGNOSIS — G3184 Mild cognitive impairment, so stated: Secondary | ICD-10-CM

## 2019-07-18 DIAGNOSIS — Z87891 Personal history of nicotine dependence: Secondary | ICD-10-CM | POA: Diagnosis not present

## 2019-07-18 DIAGNOSIS — N1831 Chronic kidney disease, stage 3a: Secondary | ICD-10-CM | POA: Diagnosis not present

## 2019-07-18 DIAGNOSIS — Z23 Encounter for immunization: Secondary | ICD-10-CM

## 2019-07-18 DIAGNOSIS — E0842 Diabetes mellitus due to underlying condition with diabetic polyneuropathy: Secondary | ICD-10-CM

## 2019-07-18 LAB — TESTOSTERONE: Testosterone: 194 ng/dL — ABNORMAL LOW (ref 300.00–890.00)

## 2019-07-18 MED ORDER — TAMSULOSIN HCL 0.4 MG PO CAPS: 0.4000 mg | ORAL_CAPSULE | Freq: Every day | ORAL | 3 refills | Status: DC

## 2019-07-18 NOTE — Progress Notes (Signed)
   Covid-19 Vaccination Clinic  Name:  [name]    [name]: [id number] DOB: Jul 12, 1945  07/18/2019  [name] was observed post Covid-19 immunization for 15 minutes without incident. He was provided with Vaccine Information Sheet and instruction to access the V-Safe system.   [name] was instructed to call 911 with any severe reactions post vaccine: [name] Kitchen Difficulty breathing  . Swelling of face and throat  . A fast heartbeat  . A bad rash all over body  . Dizziness and weakness   Immunizations Administered    Name Date Dose VIS Date Route   Pfizer COVID-19 Vaccine 07/18/2019 11:22 AM 0.3 mL 03/23/2019 Intramuscular   Manufacturer: [place]   Lot: [id number]   [place]: [address]-7

## 2019-07-19 LAB — PSA, TOTAL WITH REFLEX TO PSA, FREE: PSA, Total: 0.9 ng/mL (ref <=4.0)

## 2019-07-23 ENCOUNTER — Encounter: Payer: Medicare Other | Admitting: Physician Assistant

## 2019-07-23 ENCOUNTER — Other Ambulatory Visit: Payer: Self-pay

## 2019-07-23 ENCOUNTER — Encounter: Payer: Self-pay | Admitting: *Deleted

## 2019-07-23 DIAGNOSIS — M1990 Unspecified osteoarthritis, unspecified site: Secondary | ICD-10-CM | POA: Diagnosis not present

## 2019-07-23 DIAGNOSIS — E1142 Type 2 diabetes mellitus with diabetic polyneuropathy: Secondary | ICD-10-CM | POA: Diagnosis not present

## 2019-07-23 DIAGNOSIS — M21372 Foot drop, left foot: Secondary | ICD-10-CM | POA: Diagnosis not present

## 2019-07-23 DIAGNOSIS — Z794 Long term (current) use of insulin: Secondary | ICD-10-CM | POA: Diagnosis not present

## 2019-07-23 DIAGNOSIS — E11621 Type 2 diabetes mellitus with foot ulcer: Secondary | ICD-10-CM | POA: Diagnosis not present

## 2019-07-23 DIAGNOSIS — I10 Essential (primary) hypertension: Secondary | ICD-10-CM | POA: Diagnosis not present

## 2019-07-23 DIAGNOSIS — M109 Gout, unspecified: Secondary | ICD-10-CM | POA: Diagnosis not present

## 2019-07-23 DIAGNOSIS — L97522 Non-pressure chronic ulcer of other part of left foot with fat layer exposed: Secondary | ICD-10-CM | POA: Diagnosis not present

## 2019-07-23 DIAGNOSIS — G4730 Sleep apnea, unspecified: Secondary | ICD-10-CM | POA: Diagnosis not present

## 2019-07-23 NOTE — Progress Notes (Addendum)
[name] ([id number]) Visit Report for 07/23/2019 Arrival Information Details Patient Name: [name] Date of Service: 07/23/2019 11:00 AM Medical Record Number: [id number] Patient Account Number: [id number] Date of Birth/Sex: 1945-08-05 (73 y.o. M) Treating RN: Montey Hora Primary Care [name]: [name] Other Clinician: Referring [name]: [name] Treating [name]/Extender: [name], [name]: 23 Visit Information History Since Last Visit Added or deleted any medications: No Patient Arrived: Cane Any new allergies or adverse reactions: No Arrival Time: 11:16 Had a fall or experienced change in No Accompanied By: self activities of daily living that may affect Transfer Assistance: None risk of falls: Patient Identification Verified: No Signs or symptoms of abuse/neglect since last visito No Secondary Verification Process Completed: No Hospitalized since last visit: No Patient Has Alerts: Yes Implantable device outside of the clinic excluding No Patient Alerts: DMII cellular tissue based products placed in the center since last visit: Has Dressing in Place as Prescribed: Yes Has Footwear/Offloading in Place as Prescribed: Yes Left: Other:brace Pain Present Now: No Electronic Signature(s) Signed: 07/23/2019 4:36:33 PM By: Montey Hora Entered By: Montey Hora on 07/23/2019 11:17:04 [name] ([id number]) -------------------------------------------------------------------------------- Encounter Discharge Information Details Patient Name: [name] Date of Service: 07/23/2019 11:00 AM Medical Record Number: [id number] Patient Account Number: [id number] Date of Birth/Sex: [birth year] (73 y.o. M) Treating RN: Army Melia Primary Care Lisel Siegrist: [name] Other Clinician: Referring Shray Hunley: [name] Treating Azalynn Maxim/Extender: [name], [name]: 43 Encounter Discharge  Information Items Post Procedure Vitals Discharge Condition: Stable Temperature (F): 97.8 Ambulatory Status: Ambulatory Pulse (bpm): 91 Discharge Destination: Home Respiratory Rate (breaths/min): 16 Transportation: Private Auto Blood Pressure (mmHg): 107/59 Accompanied By: self Schedule Follow-up Appointment: Yes Clinical Summary of Care: Electronic Signature(s) Signed: 07/23/2019 12:33:51 PM By: Army Melia Entered By: Army Melia on 07/23/2019 11:27:44 [name] ([id number]) -------------------------------------------------------------------------------- Lower Extremity Assessment Details Patient Name: [name] Date of Service: 07/23/2019 11:00 AM Medical Record Number: [id number] Patient Account Number: [id number] Date of Birth/Sex: Nov 14, 1945 (73 y.o. M) Treating RN: Montey Hora Primary Care Tad Fancher: [name] Other Clinician: Referring Kaniel Kiang: [name] Treating Schuyler Behan/Extender: [name], [name]: 23 Edema Assessment Assessed: [Left: No] [Right: No] Edema: [Left: N] [Right: o] Vascular Assessment Pulses: Dorsalis Pedis Palpable: [Left:Yes] Electronic Signature(s) Signed: 07/23/2019 4:36:33 PM By: Montey Hora Entered By: Montey Hora on 07/23/2019 11:20:53 [name] ([id number]) -------------------------------------------------------------------------------- Multi Wound Chart Details Patient Name: [name] Date of Service: 07/23/2019 11:00 AM Medical Record Number: [id number] Patient Account Number: [id number] Date of Birth/Sex: 1946-01-08 (73 y.o. M) Treating RN: Army Melia Primary Care Abdullahi Vallone: [name] Other Clinician: Referring Cleston Lautner: [name] Treating Staphanie Harbison/Extender: [name], [name]: 23 Vital Signs Height(in): 71 Pulse(bpm): 91 Weight(lbs): 205 Blood Pressure(mmHg): 107/59 Body Mass Index(BMI): 29 Temperature(F): 97.8 Respiratory  Rate(breaths/min): 16 Photos: [N/A:N/A] Wound Location: Left, Lateral Foot N/A N/A Wounding Event: Pressure Injury N/A N/A Primary Etiology: Diabetic Wound/Ulcer of the Lower N/A N/A Extremity Comorbid History: Cataracts, Middle ear problems, N/A N/A Sleep Apnea, Hypertension, Type II Diabetes, Gout, Osteoarthritis, Neuropathy Date Acquired: 01/19/2019 N/A N/A Weeks of [name]: 23 N/A N/A Wound Status: Open N/A N/A Measurements L x W x D (cm) 0.5x0.3x0.1 N/A N/A Area (cm) : 0.118 N/A N/A Volume (cm) : 0.012 N/A N/A % Reduction in Area: 57.10% N/A N/A % Reduction in Volume: 85.40% N/A N/A Classification: Grade 2 N/A N/A Exudate Amount: Small N/A N/A Exudate Type: Serosanguineous N/A N/A Exudate Color:  red, brown N/A N/A Wound Margin: Flat and Intact N/A N/A Granulation Amount: Large (67-100%) N/A N/A Granulation Quality: Pink N/A N/A Necrotic Amount: None Present (0%) N/A N/A Exposed Structures: Fat Layer (Subcutaneous Tissue) N/A N/A Exposed: Yes Fascia: No Tendon: No Muscle: No Joint: No Bone: No Epithelialization: Medium (34-66%) N/A N/A [name] Notes Electronic Signature(s) Signed: 07/23/2019 12:33:51 PM By: Mena Goes, Grier Rocher ([id number]) Entered By: Army Melia on 07/23/2019 11:25:00 [name] ([id number]) -------------------------------------------------------------------------------- Multi-Disciplinary Care Plan Details Patient Name: [name] Date of Service: 07/23/2019 11:00 AM Medical Record Number: [id number] Patient Account Number: [id number] Date of Birth/Sex: 1945/10/11 (73 y.o. M) Treating RN: Army Melia Primary Care Ceci Taliaferro: [name] Other Clinician: Referring Terease Marcotte: [name] Treating Marika Mahaffy/Extender: [name], [name]: 23 Active Inactive Pressure Nursing Diagnoses: Knowledge deficit related to management of pressures ulcers Goals: Patient will remain free from development of  additional pressure ulcers Date Initiated: 04/30/2019 Target Resolution Date: 05/14/2019 Goal Status: Active Patient/caregiver will verbalize understanding of pressure ulcer management Date Initiated: 04/30/2019 Target Resolution Date: 05/14/2019 Goal Status: Active Interventions: Assess: immobility, friction, shearing, incontinence upon admission and as needed [name] Activities: Patient referred for pressure reduction/relief devices : 04/30/2019 Notes: Wound/Skin Impairment Nursing Diagnoses: Impaired tissue integrity Goals: Ulcer/skin breakdown will have a volume reduction of 30% by week 4 Date Initiated: 04/30/2019 Target Resolution Date: 05/31/2019 Goal Status: Active Interventions: Assess patient/caregiver ability to obtain necessary supplies Assess ulceration(s) every visit [name] Activities: Referred to DME Jaanai Salemi for dressing supplies : 04/30/2019 Skin care regimen initiated : 04/30/2019 Topical wound management initiated : 04/30/2019 Notes: Electronic Signature(s) Signed: 07/23/2019 12:33:51 PM By: Army Melia Entered By: Army Melia on 07/23/2019 11:24:52 [name] ([id number]) -------------------------------------------------------------------------------- Pain Assessment Details Patient Name: [name] Date of Service: 07/23/2019 11:00 AM Medical Record Number: [id number] Patient Account Number: [id number] Date of Birth/Sex: 1945-12-07 (73 y.o. M) Treating RN: Montey Hora Primary Care Cammi Consalvo: [name] Other Clinician: Referring Alisyn Lequire: [name] Treating Masiah Lewing/Extender: [name], [name]: 23 Active Problems Location of Pain Severity and Description of Pain Patient Has Paino No Site Locations Pain Management and Medication Current Pain Management: Electronic Signature(s) Signed: 07/23/2019 4:36:33 PM By: Montey Hora Entered By: Montey Hora on 07/23/2019 11:17:29 [name]  ([id number]) -------------------------------------------------------------------------------- Patient/Caregiver Education Details Patient Name: [name] Date of Service: 07/23/2019 11:00 AM Medical Record Number: [id number] Patient Account Number: [id number] Date of Birth/Gender: [birth year] (73 y.o. M) Treating RN: Army Melia Primary Care Physician: [name] Other Clinician: Referring Physician: [name] Treating Physician/Extender: Sharalyn Ink in [name]: 18 Education Assessment Education Provided To: Patient Education Topics Provided Wound/Skin Impairment: Handouts: Caring for Your Ulcer Methods: Demonstration, Explain/Verbal Responses: State content correctly Electronic Signature(s) Signed: 07/23/2019 12:33:51 PM By: Army Melia Entered By: Army Melia on 07/23/2019 11:27:03 [name] ([id number]) -------------------------------------------------------------------------------- Wound Assessment Details Patient Name: [name] Date of Service: 07/23/2019 11:00 AM Medical Record Number: [id number] Patient Account Number: [id number] Date of Birth/Sex: 08/25/1945 (73 y.o. M) Treating RN: Montey Hora Primary Care Shareka Casale: [name] Other Clinician: Referring Zhoey Blackstock: [name] Treating Syed Zukas/Extender: [name], [name]: 23 Wound Status Wound Number: 5 Primary Diabetic Wound/Ulcer of the Lower Extremity Etiology: Wound Location: Left, Lateral Foot Wound Open Wounding Event: Pressure Injury Status: Date Acquired: 01/19/2019 Comorbid Cataracts, Middle ear problems, Sleep Apnea, Weeks Of [name]: 23 History: Hypertension, Type II Diabetes, Gout, Osteoarthritis, Clustered Wound: No Neuropathy Photos Wound Measurements Length: (cm)  0.5 % Reduc Width: (cm) 0.3 % Reduc Depth: (cm) 0.1 Epithel Area: (cm) 0.118 Tunnel Volume: (cm) 0.012 Underm tion in Area: 57.1% tion in Volume:  85.4% ialization: Medium (34-66%) ing: No ining: No Wound Description Classification: Grade 2 Foul O Wound Margin: Flat and Intact Slough Exudate Amount: Small Exudate Type: Serosanguineous Exudate Color: red, brown dor After Cleansing: No /Fibrino No Wound Bed Granulation Amount: Large (67-100%) Exposed Structure Granulation Quality: Pink Fascia Exposed: No Necrotic Amount: None Present (0%) Fat Layer (Subcutaneous Tissue) Exposed: Yes Tendon Exposed: No Muscle Exposed: No Joint Exposed: No Bone Exposed: No [name] Notes Wound #5 (Left, Lateral Foot) Notes [employer], telfa Electronic Signature(s) [name], [name] ([id number]) Signed: 07/23/2019 4:36:33 PM By: Montey Hora Entered By: Montey Hora on 07/23/2019 11:20:38 [name] ([id number]) -------------------------------------------------------------------------------- Vitals Details Patient Name: [name] Date of Service: 07/23/2019 11:00 AM Medical Record Number: [id number] Patient Account Number: [id number] Date of Birth/Sex: 1945-11-15 (73 y.o. M) Treating RN: Montey Hora Primary Care Grainne Knights: [name] Other Clinician: Referring Tylisa Alcivar: [name] Treating Orabelle Rylee/Extender: [name], [name]: 23 Vital Signs Time Taken: 11:17 Temperature (F): 97.8 Height (in): 71 Pulse (bpm): 91 Weight (lbs): 205 Respiratory Rate (breaths/min): 16 Body Mass Index (BMI): 28.6 Blood Pressure (mmHg): 107/59 Reference Range: 80 - 120 mg / dl Electronic Signature(s) Signed: 07/23/2019 4:36:33 PM By: Montey Hora Entered By: Montey Hora on 07/23/2019 11:17:22

## 2019-07-25 NOTE — Progress Notes (Signed)
SEHAJ, MCENROE ([id number]) Visit Report for 07/23/2019 Chief Complaint Document Details Patient Name: [name], [name] Date of Service: 07/23/2019 11:00 AM Medical Record Number: [id number] Patient Account Number: [id number] Date of Birth/Sex: 12/08/1945 (73 y.o. M) Treating RN: Army Melia Primary Care Provider: [name] Other Clinician: Referring Provider: [name] Treating Provider/Extender: Melburn Hake, Karlen Barbar Weeks in Treatment: 23 Information Obtained from: Patient Chief Complaint Left dorsal foot ulcer 02/09/2019; patient is here for review of the wound on the left lateral foot roughly at the level of the base of the fifth metatarsal Electronic Signature(s) Signed: 07/23/2019 6:43:06 PM By: Worthy Keeler [name] Entered By: Worthy Keeler on 07/23/2019 18:43:06 [name] ([id number]) -------------------------------------------------------------------------------- Debridement Details Patient Name: [name] Date of Service: 07/23/2019 11:00 AM Medical Record Number: [id number] Patient Account Number: [id number] Date of Birth/Sex: Sep 06, 1945 (73 y.o. M) Treating RN: Army Melia Primary Care Provider: [name] Other Clinician: Referring Provider: [name] Treating Provider/Extender: Melburn Hake, Tiny Rietz Weeks in Treatment: 23 Debridement Performed for Wound #5 Left,Lateral Foot Assessment: Performed By: Physician [name], Dontrell Stuck E., [name] Debridement Type: Debridement Severity of Tissue Pre Debridement: Fat layer exposed Level of Consciousness (Pre- Awake and Alert procedure): Pre-procedure Verification/Time Out Yes - 11:24 Taken: Start Time: 11:25 Pain Control: Lidocaine Total Area Debrided (L x W): 0.5 (cm) x 0.3 (cm) = 0.15 (cm) Tissue and other material debrided: Viable, Non-Viable, Callus, Subcutaneous Level: Skin/Subcutaneous Tissue Debridement Description: Excisional Instrument: Curette Bleeding: None End Time:  11:26 Response to Treatment: Procedure was tolerated well Level of Consciousness (Post- Awake and Alert procedure): Post Debridement Measurements of Total Wound Length: (cm) 0.5 Width: (cm) 0.3 Depth: (cm) 0.1 Volume: (cm) 0.012 Character of Wound/Ulcer Post Debridement: Stable Severity of Tissue Post Debridement: Fat layer exposed Post Procedure Diagnosis Same as Pre-procedure Electronic Signature(s) Signed: 07/23/2019 12:33:51 PM By: Army Melia Signed: 07/24/2019 6:10:11 PM By: Worthy Keeler [name] Entered By: Army Melia on 07/23/2019 11:26:06 [name] ([id number]) -------------------------------------------------------------------------------- HPI Details Patient Name: [name] Date of Service: 07/23/2019 11:00 AM Medical Record Number: [id number] Patient Account Number: [id number] Date of Birth/Sex: [birth year] (73 y.o. M) Treating RN: Army Melia Primary Care Provider: [name] Other Clinician: Referring Provider: [name] Treating Provider/Extender: Melburn Hake, Ferrell Claiborne Weeks in Treatment: 23 History of Present Illness HPI Description: 10/04/16 on evaluation today patient presents for initial evaluation concerning a laceration of the right wrist and hand on the bowler aspect. He tells me that he fell into glass following a medication change in regard to his diabetes. He initially went to [facility] where she was sutured and subsequently his daughter who is a Marine scientist removed the teachers at the appropriate time. Unfortunately the wound has done poorly and dehissed which has caused him some trouble including infection. He has been on antibiotics both topically and orally though the wound continues to show signs of necrotic tissue according to notes which is what he was sent to [place] for evaluation concerning. He does have diabetes and unfortunately this is severely uncontrolled. He does see an endocrinologist and is working on this. 10/11/2016 -- the  patient's notes from the ER were reviewed and he had his suturing done on 09/17/2016 and had necrotic skin flaps which had to be trimmed the last visit he was here. He has managed to get his Santyl ointment and has been using this appropriately. 10/21/16 patient's wound appears to be doing very well on evaluation today. He still has some Psychologist, sport and exercise  the wound bed but this is nowhere near as severe as when i saw this initially two weeks ago nor even my review of his pictures last week. I'm pleased with how this is progressing. Patient's wound over the right form appears to be doing fairly well on evaluation today. There is no evidence of infection and he has a small area at the crease of where she is wrist extends and flexes that is still open and appears to be having difficulty due to the fact that it is cracking open. I think this is something that may be controlled with moisture control that is keeping the wound a little bit more moist. Nonetheless this is a tiny region compared to the initial wound and overall appears to be doing very well. No fevers, chills, nausea, or vomiting noted at this time. 11/25/16 on evaluation today patient appears to be doing well in regard to his right wrist ulcer. In fact this appears to be completely healed he is having no discomfort. He is pleased with how this is progressed. Readmission: 06/24/17 on evaluation today patient appears back in our clinic regarding issues that he has been having with his right ankle on the lateral malleolus as well is in the dorsal surface of his left foot. Both have been going on for several weeks unfortunately. He tells me that initially this was being managed by Dr. Edilia Bo and I did review the notes from Dr. Edilia Bo in epic. Patient was placed on amoxicillin initially and then subsequently Keflex following. He has been using triple antibiotic ointment over-the-counter along with a Band-Aid for the wound currently. He did not have  any Santyl remaining. His most recent hemoglobin A1c which was just two days ago was 10.3. Fortunately he seems to have excellent blood flow in the bilateral lower extremities with his left ABI being 0.95 in the right ABI 1.01. Patient does have some pain in regard to each of these ulcerations but he tells me it's not nearly as severe as the hand wound that I help treat earlier over the summer. This was in 2018. 07/15/17 on evaluation today patient's lower surety ulcer is actually both appear to be doing fairly well at this point. He does have a little bit of skin overlapping the ulcer on the right lateral ankle this was debrided away today just with saline and gauze without complication patient had no discomfort or pain. Otherwise patient's left foot ulcer does appear to be showing signs of improvement he does seem to have a right great toe. Nikki and noted at this point in time. I think that this does need to be managed at this point. No fevers, chills, nausea, or vomiting noted at this time. 07/22/17 on evaluation today patient's wounds in regard to his lower extremities appear to be doing somewhat better. His right ankle wound in fact appears to likely be completely closed. The left dorsal foot ulcer though still open does appear to be a little bit smaller and does also seem to be making good progress. He has not been having problems with the treatment at this point in the general he does not seem to show as much erythema surrounding the wound bed which is good news. He is not having significant discomfort. 08/12/17 on evaluation today patients wounds of both appeared to be doing better in fact they both appear to be completely healed. Overall I'm very pleased with how things have progressed over the past week even more than what I expected. Obviously  this is good news and he is happy about this. He did have a fall when going to his orthopedic doctor recently fortunately he does not appear to have  injured anything severely but nonetheless does have a couple of bumps and bruises nothing significant at this time but he needs to be seen and wound care for. He may have broken his left wrist he is following up with orthopedics in that regard. Readmission: 10/24/17 on evaluation today patient presents for reevaluation after having been discharged Aug 12, 2017. His wound was healed at that point although he states that it has been somewhat discolored since that time. He has not experienced the drainage as far as the wound is concerned but the fact that it was still discolored been greater than two months after the fact made him want to come in for reevaluation. Upon inspection today the patient has no pain although he does have neuropathy. He does have a discolored region of the dorsal surface of the left foot which was the site where the ulcer was that we previously treated. The good news is he does not again have any discomfort although obviously I do believe this may be some scar tissue and just discoloration in that regard. Again he hasn't had any drainage or discharge since I last saw him and at this point I see no evidence of that either this area shows complete epithelialization. In general I do not feel like there's any significant issue at this point in that regard. [name], [name] ([id number]) 02/09/2019 This is a 74 year old man with type 2 diabetes poorly controlled with significant peripheral neuropathy. He has been in this clinic before in 2018 with a laceration on his right hand and then again in 2019 with an area over the right lateral malleolus. 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 He states that the wound is been there for the last week or 2. It is been uncomfortable to walk on. The man is an active man having to look after an elderly mother. Past medical history, type 2 diabetes  with polyneuropathy with a recent hemoglobin A1c in September 2011 0.3, lumbar disc disease, hypergonadism., Left foot drop and gout. We did not repeat his ABIs in the clinic quoting values from July 2019 of 0.95 and 1.01. He is not felt to have significant PAD 02/19/2019 on evaluation today patient actually appears to be doing better with regard to his foot wound based on what I am seeing currently. He was seen by Dr. Dellia Nims on readmission back into the clinic last week when I was off. The good news is the patient has been tolerating the dressing changes without any complication in fact has not really had much in the way of drainage. He does have an AFO brace which she believes is actually pushing on this area as well and has contributed to the issue. Fortunately there is no signs of active infection at this point. His ABIs appear to be well. 03/05/2019 on evaluation today patient appears to be doing well in regard to his foot ulcer. He has been tolerating the dressing changes without complication. Fortunately there is no signs of active infection. The wound is measuring somewhat smaller and overall I feel like he is making good progress at this point. 03/12/2019 on evaluation today patient actually appears to be doing quite well with regard to his foot ulcer.  The wound is looking better the measurement is not significantly better at this point unfortunately but nonetheless again the overall appearance is improving I think he is making some progress. I still think the big issue here is foot drop and again the patient wants to see if potentially he could get a brace to help with this. Subsequently I think that we can definitely make a referral to [facility] to see if they can help [place] in this regard. He is in agreement with that plan. 03/26/2019 on evaluation today patient appears to be doing more poorly in regard to his foot ulcer at this time. He did see podiatry in order to see about getting a brace  but unfortunately he tells me that Medicare is not likely to pay for one for him as he had 1 I guess 8 months ago. However this one that apparently goes in his shoes that does not seem to be working out well for him. Unfortunately there are signs of active infection at this time. No fevers, chills, nausea, vomiting, or diarrhea. Systemically. The patient has erythema on the dorsal surface of his foot that tracks from the lateral portion of his foot and again he does have a blister around the wound as well which is worse compared to previous. Overall I am concerned. 04/02/2019 on evaluation today patient appears to be doing about the same with regard to his wound. Unfortunately there is no signs of significant improvement although I do think there is no signs of infection which is good news. His drainage is also slowed down tremendously. I think at this point he would benefit from the initiation of a total contact cast. The only concern I have is that over his balance. I explained to the patient that if we were to initiate the cast I think it would be greatly beneficial for him but he would need to ensure not to walk at all unless he uses a walker he actually has 2 walkers one for inside his home and one that he keeps out in the car. For that reason he would be covered in that regard. I just do not want him to have any accidents and fall with the cast. 12/28-Patient comes in 1 week out a day early for his appointment he was in the hospital for 2 days for uncontrolled hyperglycemia and required management of high blood sugars, with some adjustments in his home regimen of insulin, patient stubbed his right great toe today and put a Band-Aid on it, he is here for his left lateral foot ulcer and we are using silver alginate dressing, he was intact total contact cast with difficulty with balance to the extent that that had to be discontinued 04/16/2019 on evaluation today patient appears to be doing better  with regard to the overall appearance of his wound size. This is good news. We had attempted a total contact cast but unfortunately he had some falling issues that the family contacted [place] about we took the cast off we did not put it back on. Nonetheless it ended up that just a couple days later he was in the hospital due to elevations in his blood sugar which went to around 500 and even a little higher. Subsequently he was admitted to the hospital and was in the hospital for a total of 3 days and that included Christmas day. Fortunately there got his blood sugar under control he tells me that this is running about 200 now which is much  better news. There does not appear to be any signs of active infection at this time. No fevers, chills, nausea, vomiting, or diarrhea. 04/30/2019 on evaluation today patient actually appears to be doing slightly worse compared to previous as far as the overall size of his wound is concerned. There also is some need for sharp debridement today as well. Fortunately there is no evidence of active infection which is good news. No fevers, chills, nausea, vomiting, or diarrhea. 05/07/2019 upon evaluation today patient appears to be doing excellent in regard to his plantar foot ulcer. He has been tolerating the dressing changes without complication. Fortunately there is no signs of active infection at this time. No fevers, chills, nausea, vomiting, or diarrhea. He does tell me that he actually got in touch with [employer] and they feel like they can add diagnosis codes to his orders in order to get an external brace for him as opposed to the one that goes internal to issue. That would be excellent for his foot drop and I did tell him that if there is anything I need to fill out in that regard I will be more than happy to oblige and fill out what is needed for [employer]. 05/14/2019 upon evaluation today patient's original wound site actually appears to be doing much better which is good  news. Fortunately there is no signs of active infection at this time. No fevers, chills, nausea, vomiting, or diarrhea. With that being said the patient unfortunately is continuing to have issues with rubbing he tells me that the felt slid on his foot although he did not realize it until today. Nonetheless I think this did cause a blister around the edge of the wound and he does an open wound that is slightly larger overall in measurement compared to last time mainly due to this blister. Other than that I feel like he is doing quite well. 05/21/2019 upon evaluation today patient appears to be doing excellent in regard to his wound at this time on the plantar foot. He is showing signs of improvement he does have some slough and biofilm noted on the surface of the wound this is going require some sharp debridement today as well. Fortunately there is no evidence of active infection. No fevers, chills, nausea, vomiting, or diarrhea. 05/28/2019 on evaluation today patient's wound actually appears to be measuring smaller upon initial inspection and I do believe that it is doing well. WATSON, ROBARGE ([id number]) With that being said he continues to have a blister on the outside/lateral edge of the wound that occurs as a result of rubbing due to his dropfoot currently. Subsequently we have filled out the paperwork for his new external brace but again that does take several weeks to get that completed once everything is returned to back we did send this back last week as far as the paperwork was concerned. With that being said there does not appear to be any signs of active infection at this time. 06/04/2019 on evaluation today patient's wound actually appears to be doing slightly better compared to last week which is good news. He still states that his insurance unfortunately does not seem to want to cover any of his new brace. He is going to just pay for this himself. Nonetheless he is going by there today  when he leaves here that is [employer] in [place] that is going to be making this brace for him. 06/18/2019 upon evaluation today patient fortunately does have his new brace which is the  external AFO brace with bilateral uprights. Subsequently he seems to be doing much better in fact the wound already appears better. He has not even been using the felt he is just using a dressing over top of the wound and overall I am very pleased with how things seem to be progressing. There is no signs of active infection at this time which is great news and I think that the AFO is good to help him tremendously as far as trying to keep pressure off of the 06/25/2019 upon evaluation today patient seems to be making good progress in regard to his wound. He has been tolerating the dressing changes without complication. Fortunately there is no signs of active infection at this time. No fevers, chills, nausea, vomiting, or diarrhea. 07/02/2019 upon evaluation today patient unfortunately had a blistered area on the bottom of his foot tracking more towards the midline of the plantar aspect. He does not seem to have issues with rubbing more laterally that he used to have but nonetheless this is still giving him some trouble and causing problems here. Fortunately there is no signs of active infection at this time. No fevers, chills, nausea, vomiting, or diarrhea. 07/09/2019 upon evaluation today patient appears to be doing well with regard to his plantar foot ulcer. Definitely this is much improved compared to last week's visit which again last week the issue there was that he had an area of callus buildup on the plantar aspect of his foot I was able to clean this away and after effectively doing so the wound seems to be doing much better which is great news. There is no signs of active infection at this time. 07/16/2019 upon evaluation today patient appears to be making some progress in my opinion with regard to his plantar foot  ulcer. He has been tolerating the dressing changes without complication. Fortunately there is no evidence of active infection at this time which is great news. 07/23/2019 upon evaluation today patient appears to be doing well with regard to his wounds on the plantar foot. He has been tolerating the dressing changes without complication. These do seem to be healing. Electronic Signature(s) Signed: 07/23/2019 6:43:30 PM By: Worthy Keeler [name] Entered By: Worthy Keeler on 07/23/2019 18:43:29 [name] ([id number]) -------------------------------------------------------------------------------- Physical Exam Details Patient Name: [name] Date of Service: 07/23/2019 11:00 AM Medical Record Number: [id number] Patient Account Number: [id number] Date of Birth/Sex: August 04, 1945 (73 y.o. M) Treating RN: Army Melia Primary Care Provider: [name] Other Clinician: Referring Provider: [name] Treating Provider/Extender: [name], Ura Yingling Weeks in Treatment: 59 Constitutional Well-nourished and well-hydrated in no acute distress. Respiratory normal breathing without difficulty. Psychiatric this patient is able to make decisions and demonstrates good insight into disease process. Alert and Oriented x 3. pleasant and cooperative. Notes Upon inspection patient's wound did require minimal sharp debridement to clear away some slough as well as some callus. Post debridement wound bed appears to be doing much better overall and very pleased with how things stand. Electronic Signature(s) Signed: 07/23/2019 6:44:04 PM By: Worthy Keeler [name] Entered By: Worthy Keeler on 07/23/2019 18:44:04 [name] ([id number]) -------------------------------------------------------------------------------- Physician Orders Details Patient Name: [name] Date of Service: 07/23/2019 11:00 AM Medical Record Number: [id number] Patient Account Number: [id number] Date of  Birth/Sex: Aug 11, 1945 (73 y.o. M) Treating RN: Army Melia Primary Care Provider: [name] Other Clinician: Referring Provider: [name] Treating Provider/Extender: Melburn Hake, Farouk Vivero Weeks in Treatment: 42 Verbal / Phone  Orders: No Diagnosis Coding Wound Cleansing Wound #5 Left,Lateral Foot o May shower with protection. Anesthetic (add to Medication List) Wound #5 Left,Lateral Foot o Topical Lidocaine 4% cream applied to wound bed prior to debridement (In Clinic Only). Primary Wound Dressing Wound #5 Left,Lateral Foot o Silver Alginate Secondary Dressing Wound #5 [place] Dressing Change Frequency Wound #5 Left,Lateral Foot o Change dressing every other day. Follow-up Appointments Wound #5 Left,Lateral Foot o Return Appointment in 2 weeks. Edema Control Wound #5 Left,Lateral Foot o Elevate legs to the level of the heart and pump ankles as often as possible Additional Orders / Instructions Wound #5 Left,Lateral Foot o Other: - Patient to follow-up with [employer] for adjustments to braces and shoes. Requested paperwork sent to [employer]. Electronic Signature(s) Signed: 07/23/2019 12:33:51 PM By: Army Melia Signed: 07/24/2019 6:10:11 PM By: Worthy Keeler [name] Entered By: Army Melia on 07/23/2019 11:26:50 [name] ([id number]) -------------------------------------------------------------------------------- Problem List Details Patient Name: [name] Date of Service: 07/23/2019 11:00 AM Medical Record Number: [id number] Patient Account Number: [id number] Date of Birth/Sex: [birth year] (73 y.o. M) Treating RN: Army Melia Primary Care Provider: [name] Other Clinician: Referring Provider: [name] Treating Provider/Extender: Melburn Hake, Kazi Montoro Weeks in Treatment: 23 Active Problems ICD-10 Evaluated Encounter Code Description Active Date Today Diagnosis E11.621 Type 2 diabetes mellitus  with foot ulcer 02/09/2019 No Yes E11.42 Type 2 diabetes mellitus with diabetic polyneuropathy 02/09/2019 No Yes L97.522 Non-pressure chronic ulcer of other part of left foot with fat layer exposed 02/09/2019 No Yes M21.372 Foot drop, left foot 02/09/2019 No Yes Inactive Problems Resolved Problems Electronic Signature(s) Signed: 07/23/2019 6:42:55 PM By: Worthy Keeler [name] Entered By: Worthy Keeler on 07/23/2019 18:42:54 [name] ([id number]) -------------------------------------------------------------------------------- Progress Note Details Patient Name: [name] Date of Service: 07/23/2019 11:00 AM Medical Record Number: [id number] Patient Account Number: [id number] Date of Birth/Sex: 09-Aug-1945 (73 y.o. M) Treating RN: Army Melia Primary Care Provider: [name] Other Clinician: Referring Provider: [name] Treating Provider/Extender: Melburn Hake, Tandy Lewin Weeks in Treatment: 23 Subjective Chief Complaint Information obtained from Patient Left dorsal foot ulcer 02/09/2019; patient is here for review of the wound on the left lateral foot roughly at the level of the base of the fifth metatarsal History of Present Illness (HPI) 10/04/16 on evaluation today patient presents for initial evaluation concerning a laceration of the right wrist and hand on the bowler aspect. He tells me that he fell into glass following a medication change in regard to his diabetes. He initially went to [facility] where she was sutured and subsequently his daughter who is a Marine scientist removed the teachers at the appropriate time. Unfortunately the wound has done poorly and dehissed which has caused him some trouble including infection. He has been on antibiotics both topically and orally though the wound continues to show signs of necrotic tissue according to notes which is what he was sent to [place] for evaluation concerning. He does have diabetes and unfortunately this is  severely uncontrolled. He does see an endocrinologist and is working on this. 10/11/2016 -- the patient's notes from the ER were reviewed and he had his suturing done on 09/17/2016 and had necrotic skin flaps which had to be trimmed the last visit he was here. He has managed to get his Santyl ointment and has been using this appropriately. 10/21/16 patient's wound appears to be doing very well on evaluation today. He still has some Slough covering the wound bed  but this is nowhere near as severe as when i saw this initially two weeks ago nor even my review of his pictures last week. I'm pleased with how this is progressing. Patient's wound over the right form appears to be doing fairly well on evaluation today. There is no evidence of infection and he has a small area at the crease of where she is wrist extends and flexes that is still open and appears to be having difficulty due to the fact that it is cracking open. I think this is something that may be controlled with moisture control that is keeping the wound a little bit more moist. Nonetheless this is a tiny region compared to the initial wound and overall appears to be doing very well. No fevers, chills, nausea, or vomiting noted at this time. 11/25/16 on evaluation today patient appears to be doing well in regard to his right wrist ulcer. In fact this appears to be completely healed he is having no discomfort. He is pleased with how this is progressed. Readmission: 06/24/17 on evaluation today patient appears back in our clinic regarding issues that he has been having with his right ankle on the lateral malleolus as well is in the dorsal surface of his left foot. Both have been going on for several weeks unfortunately. He tells me that initially this was being managed by Dr. Edilia Bo and I did review the notes from Dr. Edilia Bo in epic. Patient was placed on amoxicillin initially and then subsequently Keflex following. He has been using triple  antibiotic ointment over-the-counter along with a Band-Aid for the wound currently. He did not have any Santyl remaining. His most recent hemoglobin A1c which was just two days ago was 10.3. Fortunately he seems to have excellent blood flow in the bilateral lower extremities with his left ABI being 0.95 in the right ABI 1.01. Patient does have some pain in regard to each of these ulcerations but he tells me it's not nearly as severe as the hand wound that I help treat earlier over the summer. This was in 2018. 07/15/17 on evaluation today patient's lower surety ulcer is actually both appear to be doing fairly well at this point. He does have a little bit of skin overlapping the ulcer on the right lateral ankle this was debrided away today just with saline and gauze without complication patient had no discomfort or pain. Otherwise patient's left foot ulcer does appear to be showing signs of improvement he does seem to have a right great toe. Nikki and noted at this point in time. I think that this does need to be managed at this point. No fevers, chills, nausea, or vomiting noted at this time. 07/22/17 on evaluation today patient's wounds in regard to his lower extremities appear to be doing somewhat better. His right ankle wound in fact appears to likely be completely closed. The left dorsal foot ulcer though still open does appear to be a little bit smaller and does also seem to be making good progress. He has not been having problems with the treatment at this point in the general he does not seem to show as much erythema surrounding the wound bed which is good news. He is not having significant discomfort. 08/12/17 on evaluation today patients wounds of both appeared to be doing better in fact they both appear to be completely healed. Overall I'm very pleased with how things have progressed over the past week even more than what I expected. Obviously this is good  news and he is happy about this. He did  have a fall when going to his orthopedic doctor recently fortunately he does not appear to have injured anything severely but nonetheless does have a couple of bumps and bruises nothing significant at this time but he needs to be seen and wound care for. He may have broken his left wrist he is following up with orthopedics in that regard. Readmission: 10/24/17 on evaluation today patient presents for reevaluation after having been discharged Aug 12, 2017. His wound was healed at that point although he states that it has been somewhat discolored since that time. He has not experienced the drainage as far as the wound is concerned but the fact that it was still discolored been greater than two months after the fact made him want to come in for reevaluation. Upon inspection today the patient has no pain although he does have neuropathy. He does have a discolored region of the dorsal surface of the left foot which was the site where the ulcer was [name], [name]. ([id number]) that we previously treated. The good news is he does not again have any discomfort although obviously I do believe this may be some scar tissue and just discoloration in that regard. Again he hasn't had any drainage or discharge since I last saw him and at this point I see no evidence of that either this area shows complete epithelialization. In general I do not feel like there's any significant issue at this point in that regard. 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 He has been in this clinic before in 2018 with a laceration on his right hand and then again in 2019 with an area over the right lateral malleolus. 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 He states that the wound is been there for the last week or 2. It is been uncomfortable to walk on. The man is  an active man having to look after an elderly mother. Past medical history, type 2 diabetes with polyneuropathy with a recent hemoglobin A1c in September 2011 0.3, lumbar disc disease, hypergonadism., Left foot drop and gout. We did not repeat his ABIs in the clinic quoting values from July 2019 of 0.95 and 1.01. He is not felt to have significant PAD 02/19/2019 on evaluation today patient actually appears to be doing better with regard to his foot wound based on what I am seeing currently. He was seen by Dr. Dellia Nims on readmission back into the clinic last week when I was off. The good news is the patient has been tolerating the dressing changes without any complication in fact has not really had much in the way of drainage. He does have an AFO brace which she believes is actually pushing on this area as well and has contributed to the issue. Fortunately there is no signs of active infection at this point. His ABIs appear to be well. 03/05/2019 on evaluation today patient appears to be doing well in regard to his foot ulcer. He has been tolerating the dressing changes without complication. Fortunately there is no signs of active infection. The wound is measuring somewhat smaller and overall I feel like he is making good progress at this point. 03/12/2019 on evaluation today patient actually appears to be doing quite well with regard to his foot ulcer. The wound is  looking better the measurement is not significantly better at this point unfortunately but nonetheless again the overall appearance is improving I think he is making some progress. I still think the big issue here is foot drop and again the patient wants to see if potentially he could get a brace to help with this. Subsequently I think that we can definitely make a referral to [facility] to see if they can help [place] in this regard. He is in agreement with that plan. 03/26/2019 on evaluation today patient appears to be doing more poorly in  regard to his foot ulcer at this time. He did see podiatry in order to see about getting a brace but unfortunately he tells me that Medicare is not likely to pay for one for him as he had 1 I guess 8 months ago. However this one that apparently goes in his shoes that does not seem to be working out well for him. Unfortunately there are signs of active infection at this time. No fevers, chills, nausea, vomiting, or diarrhea. Systemically. The patient has erythema on the dorsal surface of his foot that tracks from the lateral portion of his foot and again he does have a blister around the wound as well which is worse compared to previous. Overall I am concerned. 04/02/2019 on evaluation today patient appears to be doing about the same with regard to his wound. Unfortunately there is no signs of significant improvement although I do think there is no signs of infection which is good news. His drainage is also slowed down tremendously. I think at this point he would benefit from the initiation of a total contact cast. The only concern I have is that over his balance. I explained to the patient that if we were to initiate the cast I think it would be greatly beneficial for him but he would need to ensure not to walk at all unless he uses a walker he actually has 2 walkers one for inside his home and one that he keeps out in the car. For that reason he would be covered in that regard. I just do not want him to have any accidents and fall with the cast. 12/28-Patient comes in 1 week out a day early for his appointment he was in the hospital for 2 days for uncontrolled hyperglycemia and required management of high blood sugars, with some adjustments in his home regimen of insulin, patient stubbed his right great toe today and put a Band-Aid on it, he is here for his left lateral foot ulcer and we are using silver alginate dressing, he was intact total contact cast with difficulty with balance to the extent  that that had to be discontinued 04/16/2019 on evaluation today patient appears to be doing better with regard to the overall appearance of his wound size. This is good news. We had attempted a total contact cast but unfortunately he had some falling issues that the family contacted [place] about we took the cast off we did not put it back on. Nonetheless it ended up that just a couple days later he was in the hospital due to elevations in his blood sugar which went to around 500 and even a little higher. Subsequently he was admitted to the hospital and was in the hospital for a total of 3 days and that included Christmas day. Fortunately there got his blood sugar under control he tells me that this is running about 200 now which is much better news. There  does not appear to be any signs of active infection at this time. No fevers, chills, nausea, vomiting, or diarrhea. 04/30/2019 on evaluation today patient actually appears to be doing slightly worse compared to previous as far as the overall size of his wound is concerned. There also is some need for sharp debridement today as well. Fortunately there is no evidence of active infection which is good news. No fevers, chills, nausea, vomiting, or diarrhea. 05/07/2019 upon evaluation today patient appears to be doing excellent in regard to his plantar foot ulcer. He has been tolerating the dressing changes without complication. Fortunately there is no signs of active infection at this time. No fevers, chills, nausea, vomiting, or diarrhea. He does tell me that he actually got in touch with [employer] and they feel like they can add diagnosis codes to his orders in order to get an external brace for him as opposed to the one that goes internal to issue. That would be excellent for his foot drop and I did tell him that if there is anything I need to fill out in that regard I will be more than happy to oblige and fill out what is needed for [employer]. 05/14/2019 upon  evaluation today patient's original wound site actually appears to be doing much better which is good news. Fortunately there is no signs of active infection at this time. No fevers, chills, nausea, vomiting, or diarrhea. With that being said the patient unfortunately is continuing to have issues with rubbing he tells me that the felt slid on his foot although he did not realize it until today. Nonetheless I think this did cause a blister around the edge of the wound and he does an open wound that is slightly larger overall in measurement compared to last time mainly due to this blister. Other than that I feel like he is doing quite well. [name], [name] ([id number]) 05/21/2019 upon evaluation today patient appears to be doing excellent in regard to his wound at this time on the plantar foot. He is showing signs of improvement he does have some slough and biofilm noted on the surface of the wound this is going require some sharp debridement today as well. Fortunately there is no evidence of active infection. No fevers, chills, nausea, vomiting, or diarrhea. 05/28/2019 on evaluation today patient's wound actually appears to be measuring smaller upon initial inspection and I do believe that it is doing well. With that being said he continues to have a blister on the outside/lateral edge of the wound that occurs as a result of rubbing due to his dropfoot currently. Subsequently we have filled out the paperwork for his new external brace but again that does take several weeks to get that completed once everything is returned to back we did send this back last week as far as the paperwork was concerned. With that being said there does not appear to be any signs of active infection at this time. 06/04/2019 on evaluation today patient's wound actually appears to be doing slightly better compared to last week which is good news. He still states that his insurance unfortunately does not seem to want to cover any  of his new brace. He is going to just pay for this himself. Nonetheless he is going by there today when he leaves here that is [employer] in [place] that is going to be making this brace for him. 06/18/2019 upon evaluation today patient fortunately does have his new brace which is the external AFO brace  with bilateral uprights. Subsequently he seems to be doing much better in fact the wound already appears better. He has not even been using the felt he is just using a dressing over top of the wound and overall I am very pleased with how things seem to be progressing. There is no signs of active infection at this time which is great news and I think that the AFO is good to help him tremendously as far as trying to keep pressure off of the 06/25/2019 upon evaluation today patient seems to be making good progress in regard to his wound. He has been tolerating the dressing changes without complication. Fortunately there is no signs of active infection at this time. No fevers, chills, nausea, vomiting, or diarrhea. 07/02/2019 upon evaluation today patient unfortunately had a blistered area on the bottom of his foot tracking more towards the midline of the plantar aspect. He does not seem to have issues with rubbing more laterally that he used to have but nonetheless this is still giving him some trouble and causing problems here. Fortunately there is no signs of active infection at this time. No fevers, chills, nausea, vomiting, or diarrhea. 07/09/2019 upon evaluation today patient appears to be doing well with regard to his plantar foot ulcer. Definitely this is much improved compared to last week's visit which again last week the issue there was that he had an area of callus buildup on the plantar aspect of his foot I was able to clean this away and after effectively doing so the wound seems to be doing much better which is great news. There is no signs of active infection at this time. 07/16/2019 upon  evaluation today patient appears to be making some progress in my opinion with regard to his plantar foot ulcer. He has been tolerating the dressing changes without complication. Fortunately there is no evidence of active infection at this time which is great news. 07/23/2019 upon evaluation today patient appears to be doing well with regard to his wounds on the plantar foot. He has been tolerating the dressing changes without complication. These do seem to be healing. Objective Constitutional Well-nourished and well-hydrated in no acute distress. Vitals Time Taken: 11:17 AM, Height: 71 in, Weight: 205 lbs, BMI: 28.6, Temperature: 97.8 F, Pulse: 91 bpm, Respiratory Rate: 16 breaths/min, Blood Pressure: 107/59 mmHg. Respiratory normal breathing without difficulty. Psychiatric this patient is able to make decisions and demonstrates good insight into disease process. Alert and Oriented x 3. pleasant and cooperative. General Notes: Upon inspection patient's wound did require minimal sharp debridement to clear away some slough as well as some callus. Post debridement wound bed appears to be doing much better overall and very pleased with how things stand. Integumentary (Hair, Skin) Wound #5 status is Open. Original cause of wound was Pressure Injury. The wound is located on the Left,Lateral Foot. The wound measures 0.5cm length x 0.3cm width x 0.1cm depth; 0.118cm^2 area and 0.012cm^3 volume. There is Fat Layer (Subcutaneous Tissue) Exposed exposed. There is no tunneling or undermining noted. There is a small amount of serosanguineous drainage noted. The wound margin is flat and intact. There is large (67-100%) pink granulation within the wound bed. There is no necrotic tissue within the wound bed. 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 There was a Excisional Skin/Subcutaneous Tissue Debridement with a total area of 0.15 sq cm performed by [name], Javier Gell E., [name]. With the following instrument(s): Curette to remove Viable and Non-Viable tissue/material. Material removed includes Callus and Subcutaneous Tissue and after achieving pain control using Lidocaine. A time out was conducted at 11:24, prior to the start of the procedure. There was no bleeding. The procedure was tolerated well. Post Debridement Measurements: 0.5cm length x 0.3cm width x 0.1cm depth; 0.012cm^3 volume. Character of Wound/Ulcer Post Debridement is stable. Severity of Tissue Post Debridement is: Fat layer exposed. Post procedure Diagnosis Wound #5: Same as Pre-Procedure Plan Wound Cleansing: Wound #5 Left,Lateral Foot: May shower with protection. Anesthetic (add to Medication List): Wound #5 Left,Lateral Foot: Topical Lidocaine 4% cream applied to wound bed prior to debridement (In Clinic Only). Primary Wound Dressing: Wound #5 Left,Lateral Foot: Silver Alginate Secondary Dressing: Wound #5 Left,Lateral Foot: [place] Dressing Change Frequency: Wound #5 Left,Lateral Foot: Change dressing every other day. Follow-up Appointments: Wound #5 Left,Lateral Foot: Return Appointment in 2 weeks. Edema Control: Wound #5 Left,Lateral Foot: Elevate legs to the level of the heart and pump ankles as often as possible Additional Orders / Instructions: Wound #5 Left,Lateral Foot: Other: - Patient to follow-up with [employer] for adjustments to braces and shoes. Requested paperwork sent to [employer]. 1. My suggestion currently is good to be that we go ahead and continue with the wound care measures as before.  This is good include utilization of the alginate which seems to be keeping things nice and dry at this point he has had no new blistering. 2. I am also can recommend he continue to use his foot drop offloading brace which is external to a shoe that seems to be doing well for him right now as well. [name], [name] ([id number]) We will see patient back for reevaluation in 2 weeks here in the clinic. If anything worsens or changes patient will contact our office for additional recommendations. Electronic Signature(s) Signed: 07/23/2019 6:44:53 PM By: Worthy Keeler [name] Entered By: Worthy Keeler on 07/23/2019 18:44:53 [name] ([id number]) -------------------------------------------------------------------------------- SuperBill Details Patient Name: [name] Date of Service: 07/23/2019 Medical Record Number: [id number] Patient Account Number: [id number] Date of Birth/Sex: [birth year] (73 y.o. M) Treating RN: Army Melia Primary Care Provider: [name] Other Clinician: Referring Provider: [name] Treating Provider/Extender: Melburn Hake, Deaunna Olarte Weeks in Treatment: 23 Diagnosis Coding ICD-10 Codes Code Description E11.621 Type 2 diabetes mellitus with foot ulcer E11.42 Type 2 diabetes mellitus with diabetic polyneuropathy L97.522 Non-pressure chronic ulcer of other part of left foot with fat layer exposed M21.372 Foot drop, left foot Facility Procedures CPT4 Code: 63893734 Description: 28768 - DEB SUBQ TISSUE 20 SQ CM/< Modifier: Quantity: 1 CPT4 Code: Description: ICD-10 Diagnosis Description L97.522 Non-pressure chronic ulcer of other part of left foot with fat layer exposed Modifier: Quantity: Physician Procedures CPT4 Code: 1157262 Description: 11042 - WC PHYS SUBQ TISS 20 SQ CM Modifier: Quantity: 1 CPT4 Code: Description: ICD-10 Diagnosis Description L97.522 Non-pressure chronic ulcer of other part of left foot with fat layer  exposed Modifier: Quantity: Electronic Signature(s) Signed: 07/23/2019 6:47:03 PM By: Worthy Keeler [name] Entered By: Worthy Keeler on 07/23/2019 18:47:01

## 2019-07-30 ENCOUNTER — Encounter: Payer: Medicare Other | Admitting: Physician Assistant

## 2019-08-01 ENCOUNTER — Ambulatory Visit: Payer: Medicare Other

## 2019-08-02 ENCOUNTER — Ambulatory Visit (INDEPENDENT_AMBULATORY_CARE_PROVIDER_SITE_OTHER): Payer: Medicare Other

## 2019-08-02 DIAGNOSIS — E291 Testicular hypofunction: Secondary | ICD-10-CM | POA: Diagnosis not present

## 2019-08-02 MED ORDER — TESTOSTERONE CYPIONATE 200 MG/ML IM SOLN
150.0000 mg | INTRAMUSCULAR | Status: DC
  Administered 2019-08-02: 150 mg via INTRAMUSCULAR

## 2019-08-02 NOTE — Progress Notes (Addendum)
Per orders of Dr. Lorelei Pont, injection of testosterone cypionate 200mg /ml (.75 ml given) given by Pilar Grammes, CMA. Patient tolerated injection well.  I was available for consultation, and agree with documentation and plan.   Signed,  Maud Deed. Copland, MD

## 2019-08-06 ENCOUNTER — Other Ambulatory Visit: Payer: Self-pay

## 2019-08-06 ENCOUNTER — Encounter: Payer: Medicare Other | Admitting: Physician Assistant

## 2019-08-06 DIAGNOSIS — E1142 Type 2 diabetes mellitus with diabetic polyneuropathy: Secondary | ICD-10-CM | POA: Diagnosis not present

## 2019-08-06 DIAGNOSIS — M1990 Unspecified osteoarthritis, unspecified site: Secondary | ICD-10-CM | POA: Diagnosis not present

## 2019-08-06 DIAGNOSIS — L97522 Non-pressure chronic ulcer of other part of left foot with fat layer exposed: Secondary | ICD-10-CM | POA: Diagnosis not present

## 2019-08-06 DIAGNOSIS — I10 Essential (primary) hypertension: Secondary | ICD-10-CM | POA: Diagnosis not present

## 2019-08-06 DIAGNOSIS — G4730 Sleep apnea, unspecified: Secondary | ICD-10-CM | POA: Diagnosis not present

## 2019-08-06 DIAGNOSIS — E11621 Type 2 diabetes mellitus with foot ulcer: Secondary | ICD-10-CM | POA: Diagnosis not present

## 2019-08-06 DIAGNOSIS — M109 Gout, unspecified: Secondary | ICD-10-CM | POA: Diagnosis not present

## 2019-08-06 DIAGNOSIS — M21372 Foot drop, left foot: Secondary | ICD-10-CM | POA: Diagnosis not present

## 2019-08-06 DIAGNOSIS — Z794 Long term (current) use of insulin: Secondary | ICD-10-CM | POA: Diagnosis not present

## 2019-08-07 NOTE — Progress Notes (Signed)
[name], [name] ([id number]) Visit Report for 08/06/2019 Arrival Information Details Patient Name: Dustin, [name] Date of Service: 08/06/2019 11:00 AM Medical Record Number:[id number] Patient Account Number: [id number] Date of Birth/Sex: 11-14-1945 (73 y.o. M) Treating RN: [name] Primary Care [name]: [name] Other Clinician: Referring [name]: [name] Treating [name]/Extender:[name]: 25 Visit Information History Since Last Visit Added or deleted any medications: No Patient Arrived: Cane Any new allergies or adverse reactions: No Arrival Time: 11:12 Had a fall or experienced change in No Accompanied By: self activities of daily living that may affect Transfer Assistance: None risk of falls: Patient Identification Verified: Yes Signs or symptoms of abuse/neglect since last visito No Secondary Verification Process Completed: Yes Hospitalized since last visit: No Patient Has Alerts: Yes Implantable device outside of the clinic excluding No Patient Alerts: DMII cellular tissue based products placed in the center since last visit: Has Dressing in Place as Prescribed: Yes Has Footwear/Offloading in Place as Prescribed: Yes Left: Other:brace Pain Present Now: No Electronic Signature(s) Signed: 08/06/2019 4:47:11 PM By: [name] Entered By: [name] on 08/06/2019 11:14:17 [name] ([id number]) -------------------------------------------------------------------------------- Lower Extremity Assessment Details Patient Name: [name] Date of Service: 08/06/2019 11:00 AM Medical Record Number:[id number] Patient Account Number: [id number] Date of Birth/Sex: November 22, 1945 (73 y.o. M) Treating RN: [name] Primary Care Alexcis Bicking: [name] Other Clinician: Referring [name]: [name] Treating [name] Weeks in [name]: 25 Edema Assessment Assessed: [Left:  No] [Right: No] Edema: [Left: N] [Right: o] Vascular Assessment Pulses: Dorsalis Pedis Palpable: [Left:Yes] Electronic Signature(s) Signed: 08/06/2019 4:47:11 PM By: [name] Entered By: [name] on 08/06/2019 [place], Scalp Level E. ([id number]) -------------------------------------------------------------------------------- Multi Wound Chart Details Patient Name: [name] Date of Service: 08/06/2019 11:00 AM Medical Record Number:[id number] Patient Account Number: [id number] Date of Birth/Sex: 29-Jun-1945 (73 y.o. M) Treating RN: [name] Primary Care Jaimon Bugaj: [name] Other Clinician: Referring [name]: [name] Treating [name] Weeks in [name]: 25 Vital Signs Height(in): 71 Pulse(bpm): 125 Weight(lbs): 205 Blood Pressure(mmHg): 138/87 Body Mass Index(BMI): 29 Temperature(F): 98.7 Respiratory Rate(breaths/min): 16 Photos: [N/A:N/A] Wound Location: Left, Lateral Foot N/A N/A Wounding Event: Pressure Injury N/A N/A Primary Etiology: Diabetic Wound/Ulcer of the Lower N/A N/A Extremity Comorbid History: Cataracts, Middle ear problems, N/A N/A Sleep Apnea, Hypertension, Type II Diabetes, Gout, Osteoarthritis, Neuropathy Date Acquired: 01/19/2019 N/A N/A Weeks of [name]: 25 N/A N/A Wound Status: Open N/A N/A Measurements L x W x D (cm) 1.3x1x0.2 N/A N/A Area (cm) : 1.021 N/A N/A Volume (cm) : 0.204 N/A N/A % Reduction in Area: -271.30% N/A N/A % Reduction in Volume: -148.80% N/A N/A Classification: Grade 2 N/A N/A Exudate Amount: Medium N/A N/A Exudate Type: Serosanguineous N/A N/A Exudate Color: red, brown N/A N/A Wound Margin: Flat and Intact N/A N/A Granulation Amount: Large (67-100%) N/A N/A Granulation Quality: Pink N/A N/A Necrotic Amount: None Present (0%) N/A N/A Exposed Structures: Fat Layer (Subcutaneous Tissue) N/A N/A Exposed: Yes Fascia: No Tendon: No Muscle: No Joint:  No Bone: No Epithelialization: Medium (34-66%) N/A N/A [name] Notes Electronic Signature(s) Signed: 08/06/2019 4:20:49 PM By: [name] Entered By: [name] on 08/06/2019 11:21:30 [name] ([id number]) -------------------------------------------------------------------------------- Multi-Disciplinary Care Plan Details Patient Name: [name] Date of Service: 08/06/2019 11:00 AM Medical Record Number:[id number] Patient Account Number: [id number] Date of Birth/Sex: 09/09/1945 (73 y.o. M) Treating RN: [name] Primary Care Kylyn Sookram: [name] Other Clinician: Referring [name]: [name] Treating [name]/Extender:[name]  Weeks in [name]: 25 Active Inactive Pressure Nursing Diagnoses: Knowledge deficit related to management of pressures ulcers Goals: Patient will remain free from development of additional pressure ulcers Date Initiated: 04/30/2019 Target Resolution Date: 05/14/2019 Goal Status: Active Patient/caregiver will verbalize understanding of pressure ulcer management Date Initiated: 04/30/2019 Target Resolution Date: 05/14/2019 Goal Status: Active Interventions: Assess: immobility, friction, shearing, incontinence upon admission and as needed [name] Activities: Patient referred for pressure reduction/relief devices : 04/30/2019 Notes: Wound/Skin Impairment Nursing Diagnoses: Impaired tissue integrity Goals: Ulcer/skin breakdown will have a volume reduction of 30% by week 4 Date Initiated: 04/30/2019 Target Resolution Date: 05/31/2019 Goal Status: Active Interventions: Assess patient/caregiver ability to obtain necessary supplies Assess ulceration(s) every visit [name] Activities: Referred to DME Alta Shober for dressing supplies : 04/30/2019 Skin care regimen initiated : 04/30/2019 Topical wound management initiated : 04/30/2019 Notes: Electronic Signature(s) Signed: 08/06/2019 4:20:49 PM By: [name] Entered  By: [name] on 08/06/2019 11:21:05 [name] ([id number]) -------------------------------------------------------------------------------- Pain Assessment Details Patient Name: [name] Date of Service: 08/06/2019 11:00 AM Medical Record Number:[id number] Patient Account Number: [id number] Date of Birth/Sex: February 02, 1946 (73 y.o. M) Treating RN: [name] Primary Care Aleksey Newbern: [name] Other Clinician: Referring [name]: [name] Treating Arty Lantzy/Extender:[name]: 25 Active Problems Location of Pain Severity and Description of Pain Patient Has Paino No Site Locations Pain Management and Medication Current Pain Management: Electronic Signature(s) Signed: 08/06/2019 4:47:11 PM By: [name] Entered By: [name] on 08/06/2019 11:14:25 [name] ([id number]) -------------------------------------------------------------------------------- Patient/Caregiver Education Details Patient Name: [name] Date of Service: 08/06/2019 11:00 AM Medical Record Number:[id number] Patient Account Number: [id number] Date of Birth/Gender: 1945/06/14 (73 y.o. M) Treating RN: [name] Primary Care Physician: [name] Other Clinician: Referring Physician: Owens [name] Treating Physician/Extender:[name]: 25 Education Assessment Education Provided To: Patient Education Topics Provided Wound/Skin Impairment: Handouts: Caring for Your Ulcer Methods: Demonstration, Explain/Verbal Responses: State content correctly Electronic Signature(s) Signed: 08/06/2019 4:20:49 PM By: [name] Entered By: [name] on 08/06/2019 11:25:52 [name] ([id number]) -------------------------------------------------------------------------------- Wound Assessment Details Patient Name: [name] Date of Service: 08/06/2019 11:00 AM Medical Record Number:[id number]  Patient Account Number: [id number] Date of Birth/Sex: 1946-02-09 (73 y.o. M) Treating RN: [name] Primary Care Vianney Kopecky: [name] Other Clinician: Referring Aliea Bobe: [name] Treating Steele Stracener/Extender:[name]: 25 Wound Status Wound Number: 5 Primary Diabetic Wound/Ulcer of the Lower Extremity Wound Location: Left, Lateral Foot Etiology: Wounding Event: Pressure Injury Wound Open Date Acquired: 01/19/2019 Status: Weeks Of [name]: 25 Comorbid Cataracts, Middle ear problems, Sleep Apnea, Hypertension, Clustered Wound: No History: Type II Diabetes, Gout, Osteoarthritis, Neuropathy Photos Wound Measurements Length: (cm) 1.3 Width: (cm) 1 Depth: (cm) 0.2 Area: (cm) 1.021 Volume: (cm) 0.204 % Reduction in Area: -271.3% % Reduction in Volume: -148.8% Epithelialization: Medium (34-66%) Tunneling: No Undermining: No Wound Description Classification: Grade 2 Wound Margin: Flat and Intact Exudate Amount: Medium Exudate Type: Serosanguineous Exudate Color: red, brown Foul Odor After Cleansing: No Slough/Fibrino No Wound Bed Granulation Amount: Large (67-100%) Exposed Structure Granulation Quality: Pink Fascia Exposed: No Necrotic Amount: None Present (0%) Fat Layer (Subcutaneous Tissue) Exposed: Yes Tendon Exposed: No Muscle Exposed: No Joint Exposed: No Bone Exposed: No Electronic Signature(s) Signed: 08/06/2019 4:47:11 PM By: [name] Entered By: [name] on 08/06/2019 11:18:20 [name] ([id number]) -------------------------------------------------------------------------------- [place] Details Patient Name: [name] Date of Service: 08/06/2019 11:00 AM Medical Record Number:[id number] Patient Account Number: [id number] Date of  Birth/Sex: 07-12-1945 (74 y.o. M) Treating RN: [name] Primary Care Yamil Dougher: [name] Other Clinician: Referring Lenoard Helbert: [name] Treating  Aharon Carriere/Extender:[name]: 25 Vital Signs Time Taken: 11:15 Temperature (F): 98.7 Height (in): 71 Pulse (bpm): 125 Weight (lbs): 205 Respiratory Rate (breaths/min): 16 Body Mass Index (BMI): 28.6 Blood Pressure (mmHg): 138/87 Reference Range: 80 - 120 mg / dl Electronic Signature(s) Signed: 08/06/2019 4:47:11 PM By: [name] Entered By: [name] on 08/06/2019 11:15:41

## 2019-08-08 NOTE — Progress Notes (Addendum)
FAVOR, HACKLER ([id number]) Visit Report for 08/06/2019 Chief Complaint Document Details Patient Name: [name], [name] Date of Service: 08/06/2019 11:00 AM Medical Record Number: [id number] Patient Account Number: [id number] Date of Birth/Sex: 06-26-1945 (74 y.o. M) Treating RN: Army Melia Primary Care Provider: [name] Other Clinician: Referring Provider: [name] Treating Provider/Extender: Melburn Hake, Niara Bunker Weeks in Treatment: 25 Information Obtained from: Patient Chief Complaint Left dorsal foot ulcer 02/09/2019; patient is here for review of the wound on the left lateral foot roughly at the level of the base of the fifth metatarsal Electronic Signature(s) Signed: 08/06/2019 11:32:45 AM By: Worthy Keeler [name] Entered By: Worthy Keeler on 08/06/2019 11:32:45 [name] ([id number]) -------------------------------------------------------------------------------- Debridement Details Patient Name: [name] Date of Service: 08/06/2019 11:00 AM Medical Record Number: [id number] Patient Account Number: [id number] Date of Birth/Sex: 05-27-1945 (74 y.o. M) Treating RN: Army Melia Primary Care Provider: [name] Other Clinician: Referring Provider: [name] Treating Provider/Extender: Melburn Hake, Raynelle Fujikawa Weeks in Treatment: 25 Debridement Performed for Wound #5 Left,Lateral Foot Assessment: Performed By: Physician [name], Sarely Stracener E., [name] Debridement Type: Debridement Severity of Tissue Pre Debridement: Fat layer exposed Level of Consciousness (Pre- Awake and Alert procedure): Pre-procedure Verification/Time Out Yes - 11:20 Taken: Start Time: 11:21 Pain Control: Lidocaine Total Area Debrided (L x W): 1.3 (cm) x 1 (cm) = 1.3 (cm) Tissue and other material Viable, Non-Viable, Callus, Subcutaneous debrided: Level: Skin/Subcutaneous Tissue Debridement Description: Excisional Instrument: Curette Bleeding: Minimum Hemostasis  Achieved: Pressure End Time: 11:22 Response to Treatment: Procedure was tolerated well Level of Consciousness (Post- Awake and Alert procedure): Post Debridement Measurements of Total Wound Length: (cm) 1.3 Width: (cm) 1 Depth: (cm) 0.2 Volume: (cm) 0.204 Character of Wound/Ulcer Post Debridement: Stable Severity of Tissue Post Debridement: Fat layer exposed Post Procedure Diagnosis Same as Pre-procedure Electronic Signature(s) Signed: 08/06/2019 4:20:49 PM By: Army Melia Signed: 08/07/2019 6:46:24 PM By: Worthy Keeler [name] Entered By: Army Melia on 08/06/2019 11:22:32 [name] ([id number]) -------------------------------------------------------------------------------- HPI Details Patient Name: [name] Date of Service: 08/06/2019 11:00 AM Medical Record Number: [id number] Patient Account Number: [id number] Date of Birth/Sex: 03/03/1946 (74 y.o. M) Treating RN: Army Melia Primary Care Provider: [name] Other Clinician: Referring Provider: [name] Treating Provider/Extender: Melburn Hake, Murphy Duzan Weeks in Treatment: 25 History of Present Illness HPI Description: 10/04/16 on evaluation today patient presents for initial evaluation concerning a laceration of the right wrist and hand on the bowler aspect. He tells me that he fell into glass following a medication change in regard to his diabetes. He initially went to [facility] where she was sutured and subsequently his daughter who is a Marine scientist removed the teachers at the appropriate time. Unfortunately the wound has done poorly and dehissed which has caused him some trouble including infection. He has been on antibiotics both topically and orally though the wound continues to show signs of necrotic tissue according to notes which is what he was sent to [place] for evaluation concerning. He does have diabetes and unfortunately this is severely uncontrolled. He does see an endocrinologist and is working  on this. 10/11/2016 -- the patient's notes from the ER were reviewed and he had his suturing done on 09/17/2016 and had necrotic skin flaps which had to be trimmed the last visit he was here. He has managed to get his Santyl ointment and has been using this appropriately. 10/21/16 patient's wound appears to be doing very well on evaluation today. He still has  some Slough covering the wound bed but this is nowhere near as severe as when i saw this initially two weeks ago nor even my review of his pictures last week. I'm pleased with how this is progressing. Patient's wound over the right form appears to be doing fairly well on evaluation today. There is no evidence of infection and he has a small area at the crease of where she is wrist extends and flexes that is still open and appears to be having difficulty due to the fact that it is cracking open. I think this is something that may be controlled with moisture control that is keeping the wound a little bit more moist. Nonetheless this is a tiny region compared to the initial wound and overall appears to be doing very well. No fevers, chills, nausea, or vomiting noted at this time. 11/25/16 on evaluation today patient appears to be doing well in regard to his right wrist ulcer. In fact this appears to be completely healed he is having no discomfort. He is pleased with how this is progressed. Readmission: 06/24/17 on evaluation today patient appears back in our clinic regarding issues that he has been having with his right ankle on the lateral malleolus as well is in the dorsal surface of his left foot. Both have been going on for several weeks unfortunately. He tells me that initially this was being managed by Dr. Edilia Bo and I did review the notes from Dr. Edilia Bo in epic. Patient was placed on amoxicillin initially and then subsequently Keflex following. He has been using triple antibiotic ointment over-the-counter along with a Band-Aid for the wound  currently. He did not have any Santyl remaining. His most recent hemoglobin A1c which was just two days ago was 10.3. Fortunately he seems to have excellent blood flow in the bilateral lower extremities with his left ABI being 0.95 in the right ABI 1.01. Patient does have some pain in regard to each of these ulcerations but he tells me it's not nearly as severe as the hand wound that I help treat earlier over the summer. This was in 2018. 07/15/17 on evaluation today patient's lower surety ulcer is actually both appear to be doing fairly well at this point. He does have a little bit of skin overlapping the ulcer on the right lateral ankle this was debrided away today just with saline and gauze without complication patient had no discomfort or pain. Otherwise patient's left foot ulcer does appear to be showing signs of improvement he does seem to have a right great toe. Nikki and noted at this point in time. I think that this does need to be managed at this point. No fevers, chills, nausea, or vomiting noted at this time. 07/22/17 on evaluation today patient's wounds in regard to his lower extremities appear to be doing somewhat better. His right ankle wound in fact appears to likely be completely closed. The left dorsal foot ulcer though still open does appear to be a little bit smaller and does also seem to be making good progress. He has not been having problems with the treatment at this point in the general he does not seem to show as much erythema surrounding the wound bed which is good news. He is not having significant discomfort. 08/12/17 on evaluation today patients wounds of both appeared to be doing better in fact they both appear to be completely healed. Overall I'm very pleased with how things have progressed over the past week even more than what  I expected. Obviously this is good news and he is happy about this. He did have a fall when going to his orthopedic doctor recently fortunately he  does not appear to have injured anything severely but nonetheless does have a couple of bumps and bruises nothing significant at this time but he needs to be seen and wound care for. He may have broken his left wrist he is following up with orthopedics in that regard. Readmission: 10/24/17 on evaluation today patient presents for reevaluation after having been discharged Aug 12, 2017. His wound was healed at that point although he states that it has been somewhat discolored since that time. He has not experienced the drainage as far as the wound is concerned but the fact that it was still discolored been greater than two months after the fact made him want to come in for reevaluation. Upon inspection today the patient has no pain although he does have neuropathy. He does have a discolored region of the dorsal surface of the left foot which was the site where the ulcer was that we previously treated. The good news is he does not again have any discomfort although obviously I do believe this may be some scar tissue and just discoloration in that regard. Again he hasn't had any drainage or discharge since I last saw him and at this point I see no evidence of that either this area shows complete epithelialization. In general I do not feel like there's any significant issue at this point in that regard. 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 He has been in this clinic before in 2018 with a laceration on his right hand and then again in 2019 with an area over the right lateral malleolus. 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 He states that the wound is been there for the last week or ONIEL, MELESKI. ([id number]) 2. It is been uncomfortable to walk on. The man is an active man having to look after an elderly mother. Past medical  history, type 2 diabetes with polyneuropathy with a recent hemoglobin A1c in September 2011 0.3, lumbar disc disease, hypergonadism., Left foot drop and gout. We did not repeat his ABIs in the clinic quoting values from July 2019 of 0.95 and 1.01. He is not felt to have significant PAD 02/19/2019 on evaluation today patient actually appears to be doing better with regard to his foot wound based on what I am seeing currently. He was seen by Dr. Dellia Nims on readmission back into the clinic last week when I was off. The good news is the patient has been tolerating the dressing changes without any complication in fact has not really had much in the way of drainage. He does have an AFO brace which she believes is actually pushing on this area as well and has contributed to the issue. Fortunately there is no signs of active infection at this point. His ABIs appear to be well. 03/05/2019 on evaluation today patient appears to be doing well in regard to his foot ulcer. He has been tolerating the dressing changes without complication. Fortunately there is no signs of active infection. The wound is measuring somewhat smaller and overall I feel like he is making good progress at this point. 03/12/2019 on evaluation today patient actually appears to be doing quite well with regard to  his foot ulcer. The wound is looking better the measurement is not significantly better at this point unfortunately but nonetheless again the overall appearance is improving I think he is making some progress. I still think the big issue here is foot drop and again the patient wants to see if potentially he could get a brace to help with this. Subsequently I think that we can definitely make a referral to [facility] to see if they can help [place] in this regard. He is in agreement with that plan. 03/26/2019 on evaluation today patient appears to be doing more poorly in regard to his foot ulcer at this time. He did see podiatry in order  to see about getting a brace but unfortunately he tells me that Medicare is not likely to pay for one for him as he had 1 I guess 8 months ago. However this one that apparently goes in his shoes that does not seem to be working out well for him. Unfortunately there are signs of active infection at this time. No fevers, chills, nausea, vomiting, or diarrhea. Systemically. The patient has erythema on the dorsal surface of his foot that tracks from the lateral portion of his foot and again he does have a blister around the wound as well which is worse compared to previous. Overall I am concerned. 04/02/2019 on evaluation today patient appears to be doing about the same with regard to his wound. Unfortunately there is no signs of significant improvement although I do think there is no signs of infection which is good news. His drainage is also slowed down tremendously. I think at this point he would benefit from the initiation of a total contact cast. The only concern I have is that over his balance. I explained to the patient that if we were to initiate the cast I think it would be greatly beneficial for him but he would need to ensure not to walk at all unless he uses a walker he actually has 2 walkers one for inside his home and one that he keeps out in the car. For that reason he would be covered in that regard. I just do not want him to have any accidents and fall with the cast. 12/28-Patient comes in 1 week out a day early for his appointment he was in the hospital for 2 days for uncontrolled hyperglycemia and required management of high blood sugars, with some adjustments in his home regimen of insulin, patient stubbed his right great toe today and put a Band-Aid on it, he is here for his left lateral foot ulcer and we are using silver alginate dressing, he was intact total contact cast with difficulty with balance to the extent that that had to be discontinued 04/16/2019 on evaluation today patient  appears to be doing better with regard to the overall appearance of his wound size. This is good news. We had attempted a total contact cast but unfortunately he had some falling issues that the family contacted [place] about we took the cast off we did not put it back on. Nonetheless it ended up that just a couple days later he was in the hospital due to elevations in his blood sugar which went to around 500 and even a little higher. Subsequently he was admitted to the hospital and was in the hospital for a total of 3 days and that included Christmas day. Fortunately there got his blood sugar under control he tells me that this is running about 200 now  which is much better news. There does not appear to be any signs of active infection at this time. No fevers, chills, nausea, vomiting, or diarrhea. 04/30/2019 on evaluation today patient actually appears to be doing slightly worse compared to previous as far as the overall size of his wound is concerned. There also is some need for sharp debridement today as well. Fortunately there is no evidence of active infection which is good news. No fevers, chills, nausea, vomiting, or diarrhea. 05/07/2019 upon evaluation today patient appears to be doing excellent in regard to his plantar foot ulcer. He has been tolerating the dressing changes without complication. Fortunately there is no signs of active infection at this time. No fevers, chills, nausea, vomiting, or diarrhea. He does tell me that he actually got in touch with [employer] and they feel like they can add diagnosis codes to his orders in order to get an external brace for him as opposed to the one that goes internal to issue. That would be excellent for his foot drop and I did tell him that if there is anything I need to fill out in that regard I will be more than happy to oblige and fill out what is needed for [employer]. 05/14/2019 upon evaluation today patient's original wound site actually appears to be doing  much better which is good news. Fortunately there is no signs of active infection at this time. No fevers, chills, nausea, vomiting, or diarrhea. With that being said the patient unfortunately is continuing to have issues with rubbing he tells me that the felt slid on his foot although he did not realize it until today. Nonetheless I think this did cause a blister around the edge of the wound and he does an open wound that is slightly larger overall in measurement compared to last time mainly due to this blister. Other than that I feel like he is doing quite well. 05/21/2019 upon evaluation today patient appears to be doing excellent in regard to his wound at this time on the plantar foot. He is showing signs of improvement he does have some slough and biofilm noted on the surface of the wound this is going require some sharp debridement today as well. Fortunately there is no evidence of active infection. No fevers, chills, nausea, vomiting, or diarrhea. 05/28/2019 on evaluation today patient's wound actually appears to be measuring smaller upon initial inspection and I do believe that it is doing well. With that being said he continues to have a blister on the outside/lateral edge of the wound that occurs as a result of rubbing due to his dropfoot currently. Subsequently we have filled out the paperwork for his new external brace but again that does take several weeks to get that completed once everything is returned to back we did send this back last week as far as the paperwork was concerned. With that being said there does not appear to be any signs of active infection at this time. 06/04/2019 on evaluation today patient's wound actually appears to be doing slightly better compared to last week which is good news. He still states that his insurance unfortunately does not seem to want to cover any of his new brace. He is going to just pay for this himself. Nonetheless he is going by there today when he  leaves here that is [employer] in [place] that is going to be making this brace for him. 06/18/2019 upon evaluation today patient fortunately does have his new brace which is the external  AFO brace with bilateral uprights. Subsequently he seems to be doing much better in fact the wound already appears better. He has not even been using the felt he is just using a dressing over [address] ([id number]) top of the wound and overall I am very pleased with how things seem to be progressing. There is no signs of active infection at this time which is great news and I think that the AFO is good to help him tremendously as far as trying to keep pressure off of the 06/25/2019 upon evaluation today patient seems to be making good progress in regard to his wound. He has been tolerating the dressing changes without complication. Fortunately there is no signs of active infection at this time. No fevers, chills, nausea, vomiting, or diarrhea. 07/02/2019 upon evaluation today patient unfortunately had a blistered area on the bottom of his foot tracking more towards the midline of the plantar aspect. He does not seem to have issues with rubbing more laterally that he used to have but nonetheless this is still giving him some trouble and causing problems here. Fortunately there is no signs of active infection at this time. No fevers, chills, nausea, vomiting, or diarrhea. 07/09/2019 upon evaluation today patient appears to be doing well with regard to his plantar foot ulcer. Definitely this is much improved compared to last week's visit which again last week the issue there was that he had an area of callus buildup on the plantar aspect of his foot I was able to clean this away and after effectively doing so the wound seems to be doing much better which is great news. There is no signs of active infection at this time. 07/16/2019 upon evaluation today patient appears to be making some progress in my opinion with regard  to his plantar foot ulcer. He has been tolerating the dressing changes without complication. Fortunately there is no evidence of active infection at this time which is great news. 07/23/2019 upon evaluation today patient appears to be doing well with regard to his wounds on the plantar foot. He has been tolerating the dressing changes without complication. These do seem to be healing. 08/06/2019 upon evaluation today patient's wound actually appears to be showing some signs of improvement though he still has a significant amount of callus buildup over this location. That is can require some debridement today. With that being said the actual wound openings seem to be dramatically improved which is great news. There is no signs of active infection at this time. No fevers, chills, nausea, vomiting, or diarrhea. Electronic Signature(s) Signed: 08/06/2019 11:45:03 AM By: Worthy Keeler [name] Entered By: Worthy Keeler on 08/06/2019 11:44:51 [name] ([id number]) -------------------------------------------------------------------------------- Physical Exam Details Patient Name: [name] Date of Service: 08/06/2019 11:00 AM Medical Record Number: [id number] Patient Account Number: [id number] Date of Birth/Sex: 1945/06/25 (74 y.o. M) Treating RN: Army Melia Primary Care Provider: [name] Other Clinician: Referring Provider: [name] Treating Provider/Extender: [name], Tannor Pyon Weeks in Treatment: 64 Constitutional Well-nourished and well-hydrated in no acute distress. Respiratory normal breathing without difficulty. Psychiatric this patient is able to make decisions and demonstrates good insight into disease process. Alert and Oriented x 3. pleasant and cooperative. Notes Patient's wound bed currently showed signs again of callus buildup around the edges of the wound. Fortunately there is no evidence of infection and overall I felt like that the patient is doing  quite well when it comes to the overall healing. Especially in light  of the fact that he has foot drop and has to wear his brace and shoe pretty much at all times. Nonetheless I feel like we are definitely making progress albeit slow. Electronic Signature(s) Signed: 08/06/2019 11:46:46 AM By: Worthy Keeler [name] Entered By: Worthy Keeler on 08/06/2019 11:46:45 [name] ([id number]) -------------------------------------------------------------------------------- Physician Orders Details Patient Name: [name] Date of Service: 08/06/2019 11:00 AM Medical Record Number: [id number] Patient Account Number: [id number] Date of Birth/Sex: Dec 07, 1945 (74 y.o. M) Treating RN: Army Melia Primary Care Provider: [name] Other Clinician: Referring Provider: [name] Treating Provider/Extender: Melburn Hake, Elverda Wendel Weeks in Treatment: 25 Verbal / Phone Orders: No Diagnosis Coding Wound Cleansing Wound #5 Left,Lateral Foot o May shower with protection. Anesthetic (add to Medication List) Wound #5 Left,Lateral Foot o Topical Lidocaine 4% cream applied to wound bed prior to debridement (In Clinic Only). Primary Wound Dressing Wound #5 Left,Lateral Foot o Silver Alginate Secondary Dressing Wound #5 Left,Lateral Foot o Boardered Foam Dressing Dressing Change Frequency Wound #5 Left,Lateral Foot o Change dressing every other day. Follow-up Appointments Wound #5 Left,Lateral Foot o Return Appointment in 1 week. Edema Control Wound #5 Left,Lateral Foot o Elevate legs to the level of the heart and pump ankles as often as possible Additional Orders / Instructions Wound #5 Left,Lateral Foot o Other: - Patient to follow-up with [employer] for adjustments to braces and shoes. Requested paperwork sent to [employer]. Electronic Signature(s) Signed: 08/06/2019 4:20:49 PM By: Army Melia Signed: 08/07/2019 6:46:24 PM By: Worthy Keeler [name] Entered By: Army Melia on 08/06/2019 11:25:29 [name] ([id number]) -------------------------------------------------------------------------------- Problem List Details Patient Name: [name] Date of Service: 08/06/2019 11:00 AM Medical Record Number: [id number] Patient Account Number: [id number] Date of Birth/Sex: [birth year] (74 y.o. 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 M) Treating RN: Army Melia Primary Care Provider: [name] Other Clinician: Referring Provider: [name] Treating Provider/Extender: Melburn Hake, Rashaad Hallstrom Weeks in Treatment: 25 Subjective Chief Complaint Information obtained from Patient Left dorsal foot ulcer 02/09/2019; patient is here for review of the wound on the left lateral foot roughly at the level of the base of the fifth metatarsal History of Present Illness (HPI) 10/04/16 on evaluation today patient presents for initial evaluation concerning a laceration  of the right wrist and hand on the bowler aspect. He tells me that he fell into glass following a medication change in regard to his diabetes. He initially went to [facility] where she was sutured and subsequently his daughter who is a Marine scientist removed the teachers at the appropriate time. Unfortunately the wound has done poorly and dehissed which has caused him some trouble including infection. He has been on antibiotics both topically and orally though  the wound continues to show signs of necrotic tissue according to notes which is what he was sent to [place] for evaluation concerning. He does have diabetes and unfortunately this is severely uncontrolled. He does see an endocrinologist and is working on this. 10/11/2016 -- the patient's notes from the ER were reviewed and he had his suturing done on 09/17/2016 and had necrotic skin flaps which had to be trimmed the last visit he was here. He has managed to get his Santyl ointment and has been using this appropriately. 10/21/16 patient's wound appears to be doing very well on evaluation today. He still has some Slough covering the wound bed but this is nowhere near as severe as when i saw this initially two weeks ago nor even my review of his pictures last week. I'm pleased with how this is progressing. Patient's wound over the right form appears to be doing fairly well on evaluation today. There is no evidence of infection and he has a small area at the crease of where she is wrist extends and flexes that is still open and appears to be having difficulty due to the fact that it is cracking open. I think this is something that may be controlled with moisture control that is keeping the wound a little bit more moist. Nonetheless this is a tiny region compared to the initial wound and overall appears to be doing very well. No fevers, chills, nausea, or vomiting noted at this time. 11/25/16 on evaluation today patient appears to be doing well in regard to his  right wrist ulcer. In fact this appears to be completely healed he is having no discomfort. He is pleased with how this is progressed. Readmission: 06/24/17 on evaluation today patient appears back in our clinic regarding issues that he has been having with his right ankle on the lateral malleolus as well is in the dorsal surface of his left foot. Both have been going on for several weeks unfortunately. He tells me that initially this was being managed by Dr. Edilia Bo and I did review the notes from Dr. Edilia Bo in epic. Patient was placed on amoxicillin initially and then subsequently Keflex following. He has been using triple antibiotic ointment over-the-counter along with a Band-Aid for the wound currently. He did not have any Santyl remaining. His most recent hemoglobin A1c which was just two days ago was 10.3. Fortunately he seems to have excellent blood flow in the bilateral lower extremities with his left ABI being 0.95 in the right ABI 1.01. Patient does have some pain in regard to each of these ulcerations but he tells me it's not nearly as severe as the hand wound that I help treat earlier over the summer. This was in 2018. 07/15/17 on evaluation today patient's lower surety ulcer is actually both appear to be doing fairly well at this point. He does have a little bit of skin overlapping the ulcer on the right lateral ankle this was debrided away today just with saline and gauze without complication patient had no discomfort or pain. Otherwise patient's left foot ulcer does appear to be showing signs of improvement he does seem to have a right great toe. Nikki and noted at this point in time. I think that this does need to be managed at this point. No fevers, chills, nausea, or vomiting noted at this time. 07/22/17 on evaluation today patient's wounds in regard to his lower extremities appear to be doing somewhat better. His right ankle wound in fact appears to  likely be completely closed. The  left dorsal foot ulcer though still open does appear to be a little bit smaller and does also seem to be making good progress. He has not been having problems with the treatment at this point in the general he does not seem to show as much erythema surrounding the wound bed which is good news. He is not having significant discomfort. 08/12/17 on evaluation today patients wounds of both appeared to be doing better in fact they both appear to be completely healed. Overall I'm very pleased with how things have progressed over the past week even more than what I expected. Obviously this is good news and he is happy about this. He did have a fall when going to his orthopedic doctor recently fortunately he does not appear to have injured anything severely but nonetheless does have a couple of bumps and bruises nothing significant at this time but he needs to be seen and wound care for. He may have broken his left wrist he is following up with orthopedics in that regard. Readmission: 10/24/17 on evaluation today patient presents for reevaluation after having been discharged Aug 12, 2017. His wound was healed at that point although he states that it has been somewhat discolored since that time. He has not experienced the drainage as far as the wound is concerned but the fact that it was still discolored been greater than two months after the fact made him want to come in for reevaluation. Upon inspection today the patient has no pain although he does have neuropathy. He does have a discolored region of the dorsal surface of the left foot which was the site where the ulcer was that we previously treated. The good news is he does not again have any discomfort although obviously I do believe this may be some scar tissue and just discoloration in that regard. Again he hasn't had any drainage or discharge since I last saw him and at this point I see no evidence of that either this area shows complete  epithelialization. In general I do not feel like there's any significant issue at this point in that regard. 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 He has been in this clinic before in 2018 with a laceration on his right hand and then again in 2019 with an area over the right lateral malleolus. 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 He states that the wound is been there for the last week or 2. It is been uncomfortable to walk on. The man is an active man having to look after an elderly mother. Past medical history, type 2 diabetes with polyneuropathy with a recent hemoglobin A1c in September 2011 0.3, lumbar disc disease, hypergonadism., Left foot drop and gout. We did not repeat his ABIs in the clinic quoting values from July 2019 of 0.95 and 1.01. He is not felt to have significant PAD 02/19/2019 on evaluation today patient actually appears to be doing better with regard to his foot wound based on what I am seeing currently. He was seen by Dr. Dellia Nims on readmission back into the clinic last week when I was off. The good news is the patient has been tolerating the dressing changes without any complication in fact has not really had much in the way of drainage. He  does have an AFO brace which she believes is actually pushing on this area as well and has contributed to the issue. Fortunately there is no signs of active infection at this point. His ABIs appear to be well. 03/05/2019 on evaluation today patient appears to be doing well in regard to his foot ulcer. He has been tolerating the dressing changes without complication. Fortunately there is no signs of active infection. The wound is measuring somewhat smaller and overall I feel like he is making good progress at this point. 03/12/2019 on  evaluation today patient actually appears to be doing quite well with regard to his foot ulcer. The wound is looking better the measurement is not significantly better at this point unfortunately but nonetheless again the overall appearance is improving I think he is making some progress. I still think the big issue here is foot drop and again the patient wants to see if potentially he could get a brace to help with this. Subsequently I think that we can definitely make a referral to [facility] to see if they can help [place] in this regard. He is in agreement with that plan. 03/26/2019 on evaluation today patient appears to be doing more poorly in regard to his foot ulcer at this time. He did see podiatry in order to see about getting a brace but unfortunately he tells me that Medicare is not likely to pay for one for him as he had 1 I guess 8 months ago. However this one that apparently goes in his shoes that does not seem to be working out well for him. Unfortunately there are signs of active infection at this time. No fevers, chills, nausea, vomiting, or diarrhea. Systemically. The patient has erythema on the dorsal surface of his foot that tracks from the lateral portion of his foot and again he does have a blister around the wound as well which is worse compared to previous. Overall I am concerned. 04/02/2019 on evaluation today patient appears to be doing about the same with regard to his wound. Unfortunately there is no signs of significant improvement although I do think there is no signs of infection which is good news. His drainage is also slowed down tremendously. I think at this point he would benefit from the initiation of a total contact cast. The only concern I have is that over his balance. I explained to the patient that if we were to initiate the cast I think it would be greatly beneficial for him but he would need to ensure not to walk at all unless he uses a walker he actually has 2  walkers one for inside his home and one that he keeps out in the car. For that reason he would be covered in that regard. I just do not want him to have any accidents and fall with the cast. 12/28-Patient comes in 1 week out a day early for his appointment he was in the hospital for 2 days for uncontrolled hyperglycemia and required management of high blood sugars, with some adjustments in his home regimen of insulin, patient stubbed his right great toe today and put a Band-Aid on it, he is here for his left lateral foot ulcer and we are using silver alginate dressing, he was intact total contact cast with difficulty with balance to the extent that that had to be discontinued 04/16/2019 on evaluation today patient appears to be doing better with regard to the overall appearance of his wound size. This is good  news. We had attempted a total contact cast but unfortunately he had some falling issues that the family contacted [place] about we took the cast off we did not put it back on. Nonetheless it ended up that just a couple days later he was in the hospital due to elevations in his blood sugar which went to around 500 and even a little higher. Subsequently he was admitted to the hospital and was in the hospital for a total of 3 days and that included Christmas day. Fortunately there got his blood sugar under control he tells me that this is running about 200 now which is much better news. There does not appear to be any signs of active infection at this time. No fevers, chills, nausea, vomiting, or diarrhea. 04/30/2019 on evaluation today patient actually appears to be doing slightly worse compared to previous as far as the overall size of his wound is concerned. There also is some need for sharp debridement today as well. Fortunately there is no evidence of active infection which is good news. No fevers, chills, nausea, vomiting, or diarrhea. 05/07/2019 upon evaluation today patient appears to be doing  excellent in regard to his plantar foot ulcer. He has been tolerating the dressing changes without complication. Fortunately there is no signs of active infection at this time. No fevers, chills, nausea, vomiting, or diarrhea. He does tell me that he actually got in touch with [employer] and they feel like they can add diagnosis codes to his orders in order to get an external brace for him as opposed to the one that goes internal to issue. That would be excellent for his foot drop and I did tell him that if there is anything I need to fill out in that regard I will be more than happy to oblige and fill out what is needed for [employer]. 05/14/2019 upon evaluation today patient's original wound site actually appears to be doing much better which is good news. Fortunately there is no signs of active infection at this time. No fevers, chills, nausea, vomiting, or diarrhea. With that being said the patient unfortunately is continuing to have issues with rubbing he tells me that the felt slid on his foot although he did not realize it until today. Nonetheless I think this did cause a blister around the edge of the wound and he does an open wound that is slightly larger overall in measurement compared to last time mainly due to this blister. Other than that I feel like he is doing quite well. 05/21/2019 upon evaluation today patient appears to be doing excellent in regard to his wound at this time on the plantar foot. He is showing signs of improvement he does have some slough and biofilm noted on the surface of the wound this is going require some sharp debridement today as well. Fortunately there is no evidence of active infection. No fevers, chills, nausea, vomiting, or diarrhea. 05/28/2019 on evaluation today patient's wound actually appears to be measuring smaller upon initial inspection and I do believe that it is doing well. With that being said he continues to have a blister on the outside/lateral edge of the  wound that occurs as a result of rubbing due to his dropfoot currently. Subsequently we have filled out the paperwork for his new external brace but again that does take several weeks to get that completed once everything is returned to back we did send this back last week as far as the paperwork was concerned. With that  being said there does not appear to be any signs of active infection at this time. [name], [name] ([id number]) 06/04/2019 on evaluation today patient's wound actually appears to be doing slightly better compared to last week which is good news. He still states that his insurance unfortunately does not seem to want to cover any of his new brace. He is going to just pay for this himself. Nonetheless he is going by there today when he leaves here that is [employer] in [place] that is going to be making this brace for him. 06/18/2019 upon evaluation today patient fortunately does have his new brace which is the external AFO brace with bilateral uprights. Subsequently he seems to be doing much better in fact the wound already appears better. He has not even been using the felt he is just using a dressing over top of the wound and overall I am very pleased with how things seem to be progressing. There is no signs of active infection at this time which is great news and I think that the AFO is good to help him tremendously as far as trying to keep pressure off of the 06/25/2019 upon evaluation today patient seems to be making good progress in regard to his wound. He has been tolerating the dressing changes without complication. Fortunately there is no signs of active infection at this time. No fevers, chills, nausea, vomiting, or diarrhea. 07/02/2019 upon evaluation today patient unfortunately had a blistered area on the bottom of his foot tracking more towards the midline of the plantar aspect. He does not seem to have issues with rubbing more laterally that he used to have but nonetheless  this is still giving him some trouble and causing problems here. Fortunately there is no signs of active infection at this time. No fevers, chills, nausea, vomiting, or diarrhea. 07/09/2019 upon evaluation today patient appears to be doing well with regard to his plantar foot ulcer. Definitely this is much improved compared to last week's visit which again last week the issue there was that he had an area of callus buildup on the plantar aspect of his foot I was able to clean this away and after effectively doing so the wound seems to be doing much better which is great news. There is no signs of active infection at this time. 07/16/2019 upon evaluation today patient appears to be making some progress in my opinion with regard to his plantar foot ulcer. He has been tolerating the dressing changes without complication. Fortunately there is no evidence of active infection at this time which is great news. 07/23/2019 upon evaluation today patient appears to be doing well with regard to his wounds on the plantar foot. He has been tolerating the dressing changes without complication. These do seem to be healing. 08/06/2019 upon evaluation today patient's wound actually appears to be showing some signs of improvement though he still has a significant amount of callus buildup over this location. That is can require some debridement today. With that being said the actual wound openings seem to be dramatically improved which is great news. There is no signs of active infection at this time. No fevers, chills, nausea, vomiting, or diarrhea. Objective Constitutional Well-nourished and well-hydrated in no acute distress. Vitals Time Taken: 11:15 AM, Height: 71 in, Weight: 205 lbs, BMI: 28.6, Temperature: 98.7 F, Pulse: 125 bpm, Respiratory Rate: 16 breaths/min, Blood Pressure: 138/87 mmHg. Respiratory normal breathing without difficulty. Psychiatric this patient is able to make decisions and demonstrates good  insight  into disease process. Alert and Oriented x 3. pleasant and cooperative. General Notes: Patient's wound bed currently showed signs again of callus buildup around the edges of the wound. Fortunately there is no evidence of infection and overall I felt like that the patient is doing quite well when it comes to the overall healing. Especially in light of the fact that he has foot drop and has to wear his brace and shoe pretty much at all times. Nonetheless I feel like we are definitely making progress albeit slow. Integumentary (Hair, Skin) Wound #5 status is Open. Original cause of wound was Pressure Injury. The wound is located on the Left,Lateral Foot. The wound measures 1.3cm length x 1cm width x 0.2cm depth; 1.021cm^2 area and 0.204cm^3 volume. There is Fat Layer (Subcutaneous Tissue) Exposed exposed. There is no tunneling or undermining noted. There is a medium amount of serosanguineous drainage noted. The wound margin is flat and intact. There is large (67-100%) pink granulation within the wound bed. There is no necrotic tissue within the wound bed. 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 ([id number]) Foot drop, left foot Procedures Wound #5 Pre-procedure diagnosis of Wound #5 is a Diabetic Wound/Ulcer of the Lower Extremity located on the Left,Lateral Foot .Severity of Tissue Pre Debridement is: Fat layer exposed. There was a Excisional Skin/Subcutaneous Tissue Debridement with a total area of 1.3 sq cm performed by [name], Random Dobrowski E., [name]. With the following instrument(s): Curette to remove Viable and Non-Viable tissue/material. Material removed includes Callus and Subcutaneous Tissue and after achieving pain control using Lidocaine. A time out was conducted at 11:20, prior to the start of  the procedure. A Minimum amount of bleeding was controlled with Pressure. The procedure was tolerated well. Post Debridement Measurements: 1.3cm length x 1cm width x 0.2cm depth; 0.204cm^3 volume. Character of Wound/Ulcer Post Debridement is stable. Severity of Tissue Post Debridement is: Fat layer exposed. Post procedure Diagnosis Wound #5: Same as Pre-Procedure Plan Wound Cleansing: Wound #5 Left,Lateral Foot: May shower with protection. Anesthetic (add to Medication List): Wound #5 Left,Lateral Foot: Topical Lidocaine 4% cream applied to wound bed prior to debridement (In Clinic Only). Primary Wound Dressing: Wound #5 Left,Lateral Foot: Silver Alginate Secondary Dressing: Wound #5 Left,Lateral Foot: Boardered Foam Dressing Dressing Change Frequency: Wound #5 Left,Lateral Foot: Change dressing every other day. Follow-up Appointments: Wound #5 Left,Lateral Foot: Return Appointment in 1 week. Edema Control: Wound #5 Left,Lateral Foot: Elevate legs to the level of the heart and pump ankles as often as possible Additional Orders / Instructions: Wound #5 Left,Lateral Foot: Other: - Patient to follow-up with [employer] for adjustments to braces and shoes. Requested paperwork sent to [employer]. 1. I would recommend currently that we continue with the alginate dressing I think this is still appropriate. 2. I would recommend that we actually use border foam dressings in order to help with cushioning the area I think this may do better for him in general. 3. I am also can recommend that the patient continue to try to limit his walking as much as possible. Obviously the less that he can do in that regard the better. We will see patient back for reevaluation in 1 week here in the clinic. If anything worsens or changes patient will contact our office for additional recommendations. Electronic Signature(s) Signed: 08/06/2019 11:47:36 AM By: Worthy Keeler [name]  Entered By: Worthy Keeler on  08/06/2019 11:47:36 [name] ([id number]) -------------------------------------------------------------------------------- SuperBill Details Patient Name: [name] Date of Service: 08/06/2019 Medical Record Number: [id number] Patient Account Number: [id number] Date of Birth/Sex: 12-23-1945 (74 y.o. M) Treating RN: Army Melia Primary Care Provider: [name] Other Clinician: Referring Provider: [name] Treating Provider/Extender: Melburn Hake, Jaylie Neaves Weeks in Treatment: 25 Diagnosis Coding ICD-10 Codes Code Description E11.621 Type 2 diabetes mellitus with foot ulcer E11.42 Type 2 diabetes mellitus with diabetic polyneuropathy L97.522 Non-pressure chronic ulcer of other part of left foot with fat layer exposed M21.372 Foot drop, left foot Facility Procedures CPT4 Code: [id number] Description: 29937 - DEB SUBQ TISSUE 20 SQ CM/< Modifier: Quantity: 1 CPT4 Code: Description: ICD-10 Diagnosis Description L97.522 Non-pressure chronic ulcer of other part of left foot with fat layer exp Modifier: osed Quantity: Physician Procedures CPT4 Code: 1696789 Description: 11042 - WC PHYS SUBQ TISS 20 SQ CM Modifier: Quantity: 1 CPT4 Code: Description: ICD-10 Diagnosis Description L97.522 Non-pressure chronic ulcer of other part of left foot with fat layer exp Modifier: osed Quantity: Electronic Signature(s) Signed: 08/06/2019 11:47:52 AM By: Worthy Keeler [name] Entered By: Worthy Keeler on 08/06/2019 11:47:51

## 2019-08-09 ENCOUNTER — Other Ambulatory Visit: Payer: Self-pay | Admitting: Family Medicine

## 2019-08-13 ENCOUNTER — Other Ambulatory Visit: Payer: Self-pay

## 2019-08-13 ENCOUNTER — Encounter: Payer: Self-pay | Admitting: Family Medicine

## 2019-08-13 ENCOUNTER — Encounter: Payer: Medicare Other | Attending: Physician Assistant | Admitting: Physician Assistant

## 2019-08-13 DIAGNOSIS — E11621 Type 2 diabetes mellitus with foot ulcer: Secondary | ICD-10-CM | POA: Diagnosis not present

## 2019-08-13 DIAGNOSIS — E1142 Type 2 diabetes mellitus with diabetic polyneuropathy: Secondary | ICD-10-CM | POA: Diagnosis not present

## 2019-08-13 DIAGNOSIS — H35033 Hypertensive retinopathy, bilateral: Secondary | ICD-10-CM | POA: Diagnosis not present

## 2019-08-13 DIAGNOSIS — H26493 Other secondary cataract, bilateral: Secondary | ICD-10-CM | POA: Diagnosis not present

## 2019-08-13 DIAGNOSIS — L97522 Non-pressure chronic ulcer of other part of left foot with fat layer exposed: Secondary | ICD-10-CM | POA: Diagnosis not present

## 2019-08-13 DIAGNOSIS — L84 Corns and callosities: Secondary | ICD-10-CM | POA: Diagnosis not present

## 2019-08-13 DIAGNOSIS — M21372 Foot drop, left foot: Secondary | ICD-10-CM | POA: Insufficient documentation

## 2019-08-13 DIAGNOSIS — B0239 Other herpes zoster eye disease: Secondary | ICD-10-CM | POA: Diagnosis not present

## 2019-08-13 DIAGNOSIS — Z961 Presence of intraocular lens: Secondary | ICD-10-CM | POA: Diagnosis not present

## 2019-08-13 DIAGNOSIS — E113291 Type 2 diabetes mellitus with mild nonproliferative diabetic retinopathy without macular edema, right eye: Secondary | ICD-10-CM | POA: Diagnosis not present

## 2019-08-13 LAB — HM DIABETES EYE EXAM

## 2019-08-13 NOTE — Progress Notes (Addendum)
[name], [name] ([id number]) Visit Report for 08/13/2019 Chief Complaint Document Details Patient Name: [name], [name] Date of Service: 08/13/2019 11:00 AM Medical Record Number: [id number] Patient Account Number: [id number] Date of Birth/Sex: [birth year] (73 y.o. M) Treating RN: Army Melia Primary Care Provider: [name] Other Clinician: Referring Provider: [name] Treating Provider/Extender: Melburn Hake, Josia Cueva Weeks in Treatment: 26 Information Obtained from: Patient Chief Complaint Left dorsal foot ulcer 02/09/2019; patient is here for review of the wound on the left lateral foot roughly at the level of the base of the fifth metatarsal Electronic Signature(s) Signed: 08/13/2019 11:26:14 AM By: Worthy Keeler [name] Entered By: Worthy Keeler on 08/13/2019 11:26:14 [name] ([id number]) -------------------------------------------------------------------------------- Debridement Details Patient Name: [name] Date of Service: 08/13/2019 11:00 AM Medical Record Number: [id number] Patient Account Number: [id number] Date of Birth/Sex: 05-16-1945 (73 y.o. M) Treating RN: Army Melia Primary Care Provider: [name] Other Clinician: Referring Provider: [name] Treating Provider/Extender: Melburn Hake, Farheen Pfahler Weeks in Treatment: 26 Debridement Performed for Wound #5 Left,Lateral Foot Assessment: Performed By: Physician [name], Danee Soller E., [name] Debridement Type: Debridement Severity of Tissue Pre Debridement: Fat layer exposed Level of Consciousness (Pre- Awake and Alert procedure): Pre-procedure Verification/Time Out Yes - 11:32 Taken: Start Time: 11:33 Pain Control: Lidocaine Total Area Debrided (L x W): 0.5 (cm) x 0.3 (cm) = 0.15 (cm) Tissue and other material Viable, Non-Viable, Callus, Subcutaneous debrided: Level: Skin/Subcutaneous Tissue Debridement Description: Excisional Instrument: Curette Bleeding: Minimum Hemostasis  Achieved: Pressure End Time: 11:34 Response to Treatment: Procedure was tolerated well Level of Consciousness (Post- Awake and Alert procedure): Post Debridement Measurements of Total Wound Length: (cm) 0.5 Width: (cm) 0.3 Depth: (cm) 0.2 Volume: (cm) 0.024 Character of Wound/Ulcer Post Debridement: Stable Severity of Tissue Post Debridement: Fat layer exposed Post Procedure Diagnosis Same as Pre-procedure Electronic Signature(s) Signed: 08/13/2019 4:19:51 PM By: Army Melia Signed: 08/13/2019 5:05:18 PM By: Worthy Keeler [name] Entered By: Army Melia on 08/13/2019 11:34:03 [name] ([id number]) -------------------------------------------------------------------------------- HPI Details Patient Name: [name] Date of Service: 08/13/2019 11:00 AM Medical Record Number: [id number] Patient Account Number: [id number] Date of Birth/Sex: Oct 29, 1945 (73 y.o. M) Treating RN: Army Melia Primary Care Provider: [name] Other Clinician: Referring Provider: [name] Treating Provider/Extender: Melburn Hake, Steffani Dionisio Weeks in Treatment: 26 History of Present Illness HPI Description: 10/04/16 on evaluation today patient presents for initial evaluation concerning a laceration of the right wrist and hand on the bowler aspect. He tells me that he fell into glass following a medication change in regard to his diabetes. He initially went to [facility] where she was sutured and subsequently his daughter who is a Marine scientist removed the teachers at the appropriate time. Unfortunately the wound has done poorly and dehissed which has caused him some trouble including infection. He has been on antibiotics both topically and orally though the wound continues to show signs of necrotic tissue according to notes which is what he was sent to [place] for evaluation concerning. He does have diabetes and unfortunately this is severely uncontrolled. He does see an endocrinologist and is working  on this. 10/11/2016 -- the patient's notes from the ER were reviewed and he had his suturing done on 09/17/2016 and had necrotic skin flaps which had to be trimmed the last visit he was here. He has managed to get his Santyl ointment and has been using this appropriately. 10/21/16 patient's wound appears to be doing very well on evaluation today. He still has  some Slough covering the wound bed but this is nowhere near as severe as when i saw this initially two weeks ago nor even my review of his pictures last week. I'm pleased with how this is progressing. Patient's wound over the right form appears to be doing fairly well on evaluation today. There is no evidence of infection and he has a small area at the crease of where she is wrist extends and flexes that is still open and appears to be having difficulty due to the fact that it is cracking open. I think this is something that may be controlled with moisture control that is keeping the wound a little bit more moist. Nonetheless this is a tiny region compared to the initial wound and overall appears to be doing very well. No fevers, chills, nausea, or vomiting noted at this time. 11/25/16 on evaluation today patient appears to be doing well in regard to his right wrist ulcer. In fact this appears to be completely healed he is having no discomfort. He is pleased with how this is progressed. Readmission: 06/24/17 on evaluation today patient appears back in our clinic regarding issues that he has been having with his right ankle on the lateral malleolus as well is in the dorsal surface of his left foot. Both have been going on for several weeks unfortunately. He tells me that initially this was being managed by Dr. Edilia Bo and I did review the notes from Dr. Edilia Bo in epic. Patient was placed on amoxicillin initially and then subsequently Keflex following. He has been using triple antibiotic ointment over-the-counter along with a Band-Aid for the wound  currently. He did not have any Santyl remaining. His most recent hemoglobin A1c which was just two days ago was 10.3. Fortunately he seems to have excellent blood flow in the bilateral lower extremities with his left ABI being 0.95 in the right ABI 1.01. Patient does have some pain in regard to each of these ulcerations but he tells me it's not nearly as severe as the hand wound that I help treat earlier over the summer. This was in 2018. 07/15/17 on evaluation today patient's lower surety ulcer is actually both appear to be doing fairly well at this point. He does have a little bit of skin overlapping the ulcer on the right lateral ankle this was debrided away today just with saline and gauze without complication patient had no discomfort or pain. Otherwise patient's left foot ulcer does appear to be showing signs of improvement he does seem to have a right great toe. Nikki and noted at this point in time. I think that this does need to be managed at this point. No fevers, chills, nausea, or vomiting noted at this time. 07/22/17 on evaluation today patient's wounds in regard to his lower extremities appear to be doing somewhat better. His right ankle wound in fact appears to likely be completely closed. The left dorsal foot ulcer though still open does appear to be a little bit smaller and does also seem to be making good progress. He has not been having problems with the treatment at this point in the general he does not seem to show as much erythema surrounding the wound bed which is good news. He is not having significant discomfort. 08/12/17 on evaluation today patients wounds of both appeared to be doing better in fact they both appear to be completely healed. Overall I'm very pleased with how things have progressed over the past week even more than what  I expected. Obviously this is good news and he is happy about this. He did have a fall when going to his orthopedic doctor recently fortunately he  does not appear to have injured anything severely but nonetheless does have a couple of bumps and bruises nothing significant at this time but he needs to be seen and wound care for. He may have broken his left wrist he is following up with orthopedics in that regard. Readmission: 10/24/17 on evaluation today patient presents for reevaluation after having been discharged Aug 12, 2017. His wound was healed at that point although he states that it has been somewhat discolored since that time. He has not experienced the drainage as far as the wound is concerned but the fact that it was still discolored been greater than two months after the fact made him want to come in for reevaluation. Upon inspection today the patient has no pain although he does have neuropathy. He does have a discolored region of the dorsal surface of the left foot which was the site where the ulcer was that we previously treated. The good news is he does not again have any discomfort although obviously I do believe this may be some scar tissue and just discoloration in that regard. Again he hasn't had any drainage or discharge since I last saw him and at this point I see no evidence of that either this area shows complete epithelialization. In general I do not feel like there's any significant issue at this point in that regard. READMISSION 02/09/2019 This is a 75 year old man with type 2 diabetes poorly controlled with significant peripheral neuropathy. He has been in this clinic before in 2018 with a laceration on his right hand and then again in 2019 with an area over the right lateral malleolus. 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 He states that the wound is been there for the last week or SANDIP, POWER. ([id number]) 2. It is been uncomfortable to walk on. The man is an active man having to look after an elderly mother. Past medical  history, type 2 diabetes with polyneuropathy with a recent hemoglobin A1c in September 2011 0.3, lumbar disc disease, hypergonadism., Left foot drop and gout. We did not repeat his ABIs in the clinic quoting values from July 2019 of 0.95 and 1.01. He is not felt to have significant PAD 02/19/2019 on evaluation today patient actually appears to be doing better with regard to his foot wound based on what I am seeing currently. He was seen by Dr. Dellia Nims on readmission back into the clinic last week when I was off. The good news is the patient has been tolerating the dressing changes without any complication in fact has not really had much in the way of drainage. He does have an AFO brace which she believes is actually pushing on this area as well and has contributed to the issue. Fortunately there is no signs of active infection at this point. His ABIs appear to be well. 03/05/2019 on evaluation today patient appears to be doing well in regard to his foot ulcer. He has been tolerating the dressing changes without complication. Fortunately there is no signs of active infection. The wound is measuring somewhat smaller and overall I feel like he is making good progress at this point. 03/12/2019 on evaluation today patient actually appears to be doing quite well with regard to  his foot ulcer. The wound is looking better the measurement is not significantly better at this point unfortunately but nonetheless again the overall appearance is improving I think he is making some progress. I still think the big issue here is foot drop and again the patient wants to see if potentially he could get a brace to help with this. Subsequently I think that we can definitely make a referral to [facility] to see if they can help [place] in this regard. He is in agreement with that plan. 03/26/2019 on evaluation today patient appears to be doing more poorly in regard to his foot ulcer at this time. He did see podiatry in order  to see about getting a brace but unfortunately he tells me that Medicare is not likely to pay for one for him as he had 1 I guess 8 months ago. However this one that apparently goes in his shoes that does not seem to be working out well for him. Unfortunately there are signs of active infection at this time. No fevers, chills, nausea, vomiting, or diarrhea. Systemically. The patient has erythema on the dorsal surface of his foot that tracks from the lateral portion of his foot and again he does have a blister around the wound as well which is worse compared to previous. Overall I am concerned. 04/02/2019 on evaluation today patient appears to be doing about the same with regard to his wound. Unfortunately there is no signs of significant improvement although I do think there is no signs of infection which is good news. His drainage is also slowed down tremendously. I think at this point he would benefit from the initiation of a total contact cast. The only concern I have is that over his balance. I explained to the patient that if we were to initiate the cast I think it would be greatly beneficial for him but he would need to ensure not to walk at all unless he uses a walker he actually has 2 walkers one for inside his home and one that he keeps out in the car. For that reason he would be covered in that regard. I just do not want him to have any accidents and fall with the cast. 12/28-Patient comes in 1 week out a day early for his appointment he was in the hospital for 2 days for uncontrolled hyperglycemia and required management of high blood sugars, with some adjustments in his home regimen of insulin, patient stubbed his right great toe today and put a Band-Aid on it, he is here for his left lateral foot ulcer and we are using silver alginate dressing, he was intact total contact cast with difficulty with balance to the extent that that had to be discontinued 04/16/2019 on evaluation today patient  appears to be doing better with regard to the overall appearance of his wound size. This is good news. We had attempted a total contact cast but unfortunately he had some falling issues that the family contacted [place] about we took the cast off we did not put it back on. Nonetheless it ended up that just a couple days later he was in the hospital due to elevations in his blood sugar which went to around 500 and even a little higher. Subsequently he was admitted to the hospital and was in the hospital for a total of 3 days and that included Christmas day. Fortunately there got his blood sugar under control he tells me that this is running about 200 now  which is much better news. There does not appear to be any signs of active infection at this time. No fevers, chills, nausea, vomiting, or diarrhea. 04/30/2019 on evaluation today patient actually appears to be doing slightly worse compared to previous as far as the overall size of his wound is concerned. There also is some need for sharp debridement today as well. Fortunately there is no evidence of active infection which is good news. No fevers, chills, nausea, vomiting, or diarrhea. 05/07/2019 upon evaluation today patient appears to be doing excellent in regard to his plantar foot ulcer. He has been tolerating the dressing changes without complication. Fortunately there is no signs of active infection at this time. No fevers, chills, nausea, vomiting, or diarrhea. He does tell me that he actually got in touch with [employer] and they feel like they can add diagnosis codes to his orders in order to get an external brace for him as opposed to the one that goes internal to issue. That would be excellent for his foot drop and I did tell him that if there is anything I need to fill out in that regard I will be more than happy to oblige and fill out what is needed for [employer]. 05/14/2019 upon evaluation today patient's original wound site actually appears to be doing  much better which is good news. Fortunately there is no signs of active infection at this time. No fevers, chills, nausea, vomiting, or diarrhea. With that being said the patient unfortunately is continuing to have issues with rubbing he tells me that the felt slid on his foot although he did not realize it until today. Nonetheless I think this did cause a blister around the edge of the wound and he does an open wound that is slightly larger overall in measurement compared to last time mainly due to this blister. Other than that I feel like he is doing quite well. 05/21/2019 upon evaluation today patient appears to be doing excellent in regard to his wound at this time on the plantar foot. He is showing signs of improvement he does have some slough and biofilm noted on the surface of the wound this is going require some sharp debridement today as well. Fortunately there is no evidence of active infection. No fevers, chills, nausea, vomiting, or diarrhea. 05/28/2019 on evaluation today patient's wound actually appears to be measuring smaller upon initial inspection and I do believe that it is doing well. With that being said he continues to have a blister on the outside/lateral edge of the wound that occurs as a result of rubbing due to his dropfoot currently. Subsequently we have filled out the paperwork for his new external brace but again that does take several weeks to get that completed once everything is returned to back we did send this back last week as far as the paperwork was concerned. With that being said there does not appear to be any signs of active infection at this time. 06/04/2019 on evaluation today patient's wound actually appears to be doing slightly better compared to last week which is good news. He still states that his insurance unfortunately does not seem to want to cover any of his new brace. He is going to just pay for this himself. Nonetheless he is going by there today when he  leaves here that is [employer] in [place] that is going to be making this brace for him. 06/18/2019 upon evaluation today patient fortunately does have his new brace which is the external  AFO brace with bilateral uprights. Subsequently he seems to be doing much better in fact the wound already appears better. He has not even been using the felt he is just using a dressing over Carver ([id number]) top of the wound and overall I am very pleased with how things seem to be progressing. There is no signs of active infection at this time which is great news and I think that the AFO is good to help him tremendously as far as trying to keep pressure off of the 06/25/2019 upon evaluation today patient seems to be making good progress in regard to his wound. He has been tolerating the dressing changes without complication. Fortunately there is no signs of active infection at this time. No fevers, chills, nausea, vomiting, or diarrhea. 07/02/2019 upon evaluation today patient unfortunately had a blistered area on the bottom of his foot tracking more towards the midline of the plantar aspect. He does not seem to have issues with rubbing more laterally that he used to have but nonetheless this is still giving him some trouble and causing problems here. Fortunately there is no signs of active infection at this time. No fevers, chills, nausea, vomiting, or diarrhea. 07/09/2019 upon evaluation today patient appears to be doing well with regard to his plantar foot ulcer. Definitely this is much improved compared to last week's visit which again last week the issue there was that he had an area of callus buildup on the plantar aspect of his foot I was able to clean this away and after effectively doing so the wound seems to be doing much better which is great news. There is no signs of active infection at this time. 07/16/2019 upon evaluation today patient appears to be making some progress in my opinion with regard  to his plantar foot ulcer. He has been tolerating the dressing changes without complication. Fortunately there is no evidence of active infection at this time which is great news. 07/23/2019 upon evaluation today patient appears to be doing well with regard to his wounds on the plantar foot. He has been tolerating the dressing changes without complication. These do seem to be healing. 08/06/2019 upon evaluation today patient's wound actually appears to be showing some signs of improvement though he still has a significant amount of callus buildup over this location. That is can require some debridement today. With that being said the actual wound openings seem to be dramatically improved which is great news. There is no signs of active infection at this time. No fevers, chills, nausea, vomiting, or diarrhea. 08/13/2019 upon evaluation today patient appears to be doing well with regard to his foot ulcer. He has been tolerating the dressing changes without complication. Fortunately there is no signs of active infection at this time. No fevers, chills, nausea, vomiting, or diarrhea. Electronic Signature(s) Signed: 08/13/2019 12:51:32 PM By: Worthy Keeler [name] Entered By: Worthy Keeler on 08/13/2019 12:51:32 [name] ([id number]) -------------------------------------------------------------------------------- Physical Exam Details Patient Name: [name] Date of Service: 08/13/2019 11:00 AM Medical Record Number: [id number] Patient Account Number: [id number] Date of Birth/Sex: 07/19/1945 (73 y.o. M) Treating RN: Army Melia Primary Care Provider: [name] Other Clinician: Referring Provider: [name] Treating Provider/Extender: [name], Soni Kegel Weeks in Treatment: 24 Constitutional Well-nourished and well-hydrated in no acute distress. Respiratory normal breathing without difficulty. Psychiatric this patient is able to make decisions and demonstrates good insight  into disease process. Alert and Oriented x 3. pleasant and cooperative. Notes His wound  did require some sharp debridement to remove callus from the surface of the wound which he tolerated today without complication post debridement the wound bed appears to be doing much better which is great news. Overall I do feel like he has made excellent improvement since last week and the satellite lesion that was up and then no longer appears to be actually open today. Electronic Signature(s) Signed: 08/13/2019 12:51:54 PM By: Worthy Keeler [name] Entered By: Worthy Keeler on 08/13/2019 12:51:53 [name] ([id number]) -------------------------------------------------------------------------------- Physician Orders Details Patient Name: [name] Date of Service: 08/13/2019 11:00 AM Medical Record Number: [id number] Patient Account Number: [id number] Date of Birth/Sex: 1945-10-03 (73 y.o. M) Treating RN: Army Melia Primary Care Provider: [name] Other Clinician: Referring Provider: [name] Treating Provider/Extender: Melburn Hake, Hillard Goodwine Weeks in Treatment: 43 Verbal / Phone Orders: No Diagnosis Coding ICD-10 Coding Code Description E11.621 Type 2 diabetes mellitus with foot ulcer E11.42 Type 2 diabetes mellitus with diabetic polyneuropathy L97.522 Non-pressure chronic ulcer of other part of left foot with fat layer exposed M21.372 Foot drop, left foot Wound Cleansing Wound #5 Left,Lateral Foot o May shower with protection. Anesthetic (add to Medication List) Wound #5 Left,Lateral Foot o Topical Lidocaine 4% cream applied to wound bed prior to debridement (In Clinic Only). Primary Wound Dressing Wound #5 Left,Lateral Foot o Silver Alginate Secondary Dressing Wound #5 Left,Lateral Foot o Boardered Foam Dressing Dressing Change Frequency Wound #5 Left,Lateral Foot o Change dressing every other day. Follow-up Appointments Wound #5 Left,Lateral  Foot o Return Appointment in 1 week. Edema Control Wound #5 Left,Lateral Foot o Elevate legs to the level of the heart and pump ankles as often as possible Additional Orders / Instructions Wound #5 Left,Lateral Foot o Other: - Patient to follow-up with [employer] for adjustments to braces and shoes. Requested paperwork sent to [employer]. Electronic Signature(s) Signed: 08/13/2019 4:19:51 PM By: Army Melia Signed: 08/13/2019 5:05:18 PM By: Worthy Keeler [name] Entered By: Army Melia on 08/13/2019 11:34:38 [name] ([id number]) -------------------------------------------------------------------------------- Problem List Details Patient Name: [name] Date of Service: 08/13/2019 11:00 AM Medical Record Number: [id number] Patient Account Number: [id number] Date of Birth/Sex: [birth year] (73 y.o. M) Treating RN: Army Melia Primary Care Provider: [name] Other Clinician: Referring Provider: [name] Treating Provider/Extender: Melburn Hake, Christan Ciccarelli Weeks in Treatment: 26 Active Problems ICD-10 Encounter Code Description Active Date MDM Diagnosis E11.621 Type 2 diabetes mellitus with foot ulcer 02/09/2019 No Yes E11.42 Type 2 diabetes mellitus with diabetic polyneuropathy 02/09/2019 No Yes L97.522 Non-pressure chronic ulcer of other part of left foot with fat layer 02/09/2019 No Yes exposed M21.372 Foot drop, left foot 02/09/2019 No Yes Inactive Problems Resolved Problems Electronic Signature(s) Signed: 08/13/2019 11:26:06 AM By: Worthy Keeler [name] Entered By: Worthy Keeler on 08/13/2019 11:26:05 [name] ([id number]) -------------------------------------------------------------------------------- Progress Note Details Patient Name: [name] Date of Service: 08/13/2019 11:00 AM Medical Record Number: [id number] Patient Account Number: [id number] Date of Birth/Sex: [birth year] (73 y.o. M) Treating RN: Army Melia Primary Care Provider:  [name] Other Clinician: Referring Provider: [name] Treating Provider/Extender: Melburn Hake, Frederick Marro Weeks in Treatment: 26 Subjective Chief Complaint Information obtained from Patient Left dorsal foot ulcer 02/09/2019; patient is here for review of the wound on the left lateral foot roughly at the level of the base of the fifth metatarsal History of Present Illness (HPI) 10/04/16 on evaluation today patient presents for initial evaluation concerning a laceration of the right wrist and hand on  the bowler aspect. He tells me that he fell into glass following a medication change in regard to his diabetes. He initially went to [facility] where she was sutured and subsequently his daughter who is a Marine scientist removed the teachers at the appropriate time. Unfortunately the wound has done poorly and dehissed which has caused him some trouble including infection. He has been on antibiotics both topically and orally though the wound continues to show signs of necrotic tissue according to notes which is what he was sent to [place] for evaluation concerning. He does have diabetes and unfortunately this is severely uncontrolled. He does see an endocrinologist and is working on this. 10/11/2016 -- the patient's notes from the ER were reviewed and he had his suturing done on 09/17/2016 and had necrotic skin flaps which had to be trimmed the last visit he was here. He has managed to get his Santyl ointment and has been using this appropriately. 10/21/16 patient's wound appears to be doing very well on evaluation today. He still has some Slough covering the wound bed but this is nowhere near as severe as when i saw this initially two weeks ago nor even my review of his pictures last week. I'm pleased with how this is progressing. Patient's wound over the right form appears to be doing fairly well on evaluation today. There is no evidence of infection and he has a small area at the crease of where she is  wrist extends and flexes that is still open and appears to be having difficulty due to the fact that it is cracking open. I think this is something that may be controlled with moisture control that is keeping the wound a little bit more moist. Nonetheless this is a tiny region compared to the initial wound and overall appears to be doing very well. No fevers, chills, nausea, or vomiting noted at this time. 11/25/16 on evaluation today patient appears to be doing well in regard to his right wrist ulcer. In fact this appears to be completely healed he is having no discomfort. He is pleased with how this is progressed. Readmission: 06/24/17 on evaluation today patient appears back in our clinic regarding issues that he has been having with his right ankle on the lateral malleolus as well is in the dorsal surface of his left foot. Both have been going on for several weeks unfortunately. He tells me that initially this was being managed by Dr. Edilia Bo and I did review the notes from Dr. Edilia Bo in epic. Patient was placed on amoxicillin initially and then subsequently Keflex following. He has been using triple antibiotic ointment over-the-counter along with a Band-Aid for the wound currently. He did not have any Santyl remaining. His most recent hemoglobin A1c which was just two days ago was 10.3. Fortunately he seems to have excellent blood flow in the bilateral lower extremities with his left ABI being 0.95 in the right ABI 1.01. Patient does have some pain in regard to each of these ulcerations but he tells me it's not nearly as severe as the hand wound that I help treat earlier over the summer. This was in 2018. 07/15/17 on evaluation today patient's lower surety ulcer is actually both appear to be doing fairly well at this point. He does have a little bit of skin overlapping the ulcer on the right lateral ankle this was debrided away today just with saline and gauze without complication patient had  no discomfort or pain. Otherwise patient's left foot ulcer  does appear to be showing signs of improvement he does seem to have a right great toe. Nikki and noted at this point in time. I think that this does need to be managed at this point. No fevers, chills, nausea, or vomiting noted at this time. 07/22/17 on evaluation today patient's wounds in regard to his lower extremities appear to be doing somewhat better. His right ankle wound in fact appears to likely be completely closed. The left dorsal foot ulcer though still open does appear to be a little bit smaller and does also seem to be making good progress. He has not been having problems with the treatment at this point in the general he does not seem to show as much erythema surrounding the wound bed which is good news. He is not having significant discomfort. 08/12/17 on evaluation today patients wounds of both appeared to be doing better in fact they both appear to be completely healed. Overall I'm very pleased with how things have progressed over the past week even more than what I expected. Obviously this is good news and he is happy about this. He did have a fall when going to his orthopedic doctor recently fortunately he does not appear to have injured anything severely but nonetheless does have a couple of bumps and bruises nothing significant at this time but he needs to be seen and wound care for. He may have broken his left wrist he is following up with orthopedics in that regard. Readmission: 10/24/17 on evaluation today patient presents for reevaluation after having been discharged Aug 12, 2017. His wound was healed at that point although he states that it has been somewhat discolored since that time. He has not experienced the drainage as far as the wound is concerned but the fact that it was still discolored been greater than two months after the fact made him want to come in for reevaluation. Upon inspection today the patient has no  pain although he does have neuropathy. He does have a discolored region of the dorsal surface of the left foot which was the site where the ulcer was that we previously treated. The good news is he does not again have any discomfort although obviously I do believe this may be some scar tissue and just discoloration in that regard. Again he hasn't had any drainage or discharge since I last saw him and at this point I see no evidence of that either this area shows complete epithelialization. In general I do not feel like there's any significant issue at this point in that regard. ALEXZAVIER, GIRARDIN ([id number]) READMISSION 02/09/2019 This is a 74 year old man with type 2 diabetes poorly controlled with significant peripheral neuropathy. He has been in this clinic before in 2018 with a laceration on his right hand and then again in 2019 with an area over the right lateral malleolus. 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 He states that the wound is been there for the last week or 2. It is been uncomfortable to walk on. The man is an active man having to look after an elderly mother. Past medical history, type 2 diabetes with polyneuropathy with a recent hemoglobin A1c in September 2011 0.3, lumbar disc disease, hypergonadism., Left foot drop and gout. We did not repeat his ABIs in the clinic quoting values from July 2019 of 0.95 and 1.01. He is not felt to have  significant PAD 02/19/2019 on evaluation today patient actually appears to be doing better with regard to his foot wound based on what I am seeing currently. He was seen by Dr. Dellia Nims on readmission back into the clinic last week when I was off. The good news is the patient has been tolerating the dressing changes without any complication in fact has not really had much in the way of drainage. He does have an AFO brace which she believes is actually pushing on  this area as well and has contributed to the issue. Fortunately there is no signs of active infection at this point. His ABIs appear to be well. 03/05/2019 on evaluation today patient appears to be doing well in regard to his foot ulcer. He has been tolerating the dressing changes without complication. Fortunately there is no signs of active infection. The wound is measuring somewhat smaller and overall I feel like he is making good progress at this point. 03/12/2019 on evaluation today patient actually appears to be doing quite well with regard to his foot ulcer. The wound is looking better the measurement is not significantly better at this point unfortunately but nonetheless again the overall appearance is improving I think he is making some progress. I still think the big issue here is foot drop and again the patient wants to see if potentially he could get a brace to help with this. Subsequently I think that we can definitely make a referral to [facility] to see if they can help [place] in this regard. He is in agreement with that plan. 03/26/2019 on evaluation today patient appears to be doing more poorly in regard to his foot ulcer at this time. He did see podiatry in order to see about getting a brace but unfortunately he tells me that Medicare is not likely to pay for one for him as he had 1 I guess 8 months ago. However this one that apparently goes in his shoes that does not seem to be working out well for him. Unfortunately there are signs of active infection at this time. No fevers, chills, nausea, vomiting, or diarrhea. Systemically. The patient has erythema on the dorsal surface of his foot that tracks from the lateral portion of his foot and again he does have a blister around the wound as well which is worse compared to previous. Overall I am concerned. 04/02/2019 on evaluation today patient appears to be doing about the same with regard to his wound. Unfortunately there is no signs  of significant improvement although I do think there is no signs of infection which is good news. His drainage is also slowed down tremendously. I think at this point he would benefit from the initiation of a total contact cast. The only concern I have is that over his balance. I explained to the patient that if we were to initiate the cast I think it would be greatly beneficial for him but he would need to ensure not to walk at all unless he uses a walker he actually has 2 walkers one for inside his home and one that he keeps out in the car. For that reason he would be covered in that regard. I just do not want him to have any accidents and fall with the cast. 12/28-Patient comes in 1 week out a day early for his appointment he was in the hospital for 2 days for uncontrolled hyperglycemia and required management of high blood sugars, with some adjustments in his home regimen of  insulin, patient stubbed his right great toe today and put a Band-Aid on it, he is here for his left lateral foot ulcer and we are using silver alginate dressing, he was intact total contact cast with difficulty with balance to the extent that that had to be discontinued 04/16/2019 on evaluation today patient appears to be doing better with regard to the overall appearance of his wound size. This is good news. We had attempted a total contact cast but unfortunately he had some falling issues that the family contacted [place] about we took the cast off we did not put it back on. Nonetheless it ended up that just a couple days later he was in the hospital due to elevations in his blood sugar which went to around 500 and even a little higher. Subsequently he was admitted to the hospital and was in the hospital for a total of 3 days and that included Christmas day. Fortunately there got his blood sugar under control he tells me that this is running about 200 now which is much better news. There does not appear to be any signs of active  infection at this time. No fevers, chills, nausea, vomiting, or diarrhea. 04/30/2019 on evaluation today patient actually appears to be doing slightly worse compared to previous as far as the overall size of his wound is concerned. There also is some need for sharp debridement today as well. Fortunately there is no evidence of active infection which is good news. No fevers, chills, nausea, vomiting, or diarrhea. 05/07/2019 upon evaluation today patient appears to be doing excellent in regard to his plantar foot ulcer. He has been tolerating the dressing changes without complication. Fortunately there is no signs of active infection at this time. No fevers, chills, nausea, vomiting, or diarrhea. He does tell me that he actually got in touch with [employer] and they feel like they can add diagnosis codes to his orders in order to get an external brace for him as opposed to the one that goes internal to issue. That would be excellent for his foot drop and I did tell him that if there is anything I need to fill out in that regard I will be more than happy to oblige and fill out what is needed for [employer]. 05/14/2019 upon evaluation today patient's original wound site actually appears to be doing much better which is good news. Fortunately there is no signs of active infection at this time. No fevers, chills, nausea, vomiting, or diarrhea. With that being said the patient unfortunately is continuing to have issues with rubbing he tells me that the felt slid on his foot although he did not realize it until today. Nonetheless I think this did cause a blister around the edge of the wound and he does an open wound that is slightly larger overall in measurement compared to last time mainly due to this blister. Other than that I feel like he is doing quite well. 05/21/2019 upon evaluation today patient appears to be doing excellent in regard to his wound at this time on the plantar foot. He is showing signs of  improvement he does have some slough and biofilm noted on the surface of the wound this is going require some sharp debridement today as well. Fortunately there is no evidence of active infection. No fevers, chills, nausea, vomiting, or diarrhea. 05/28/2019 on evaluation today patient's wound actually appears to be measuring smaller upon initial inspection and I do believe that it is doing well. With that  being said he continues to have a blister on the outside/lateral edge of the wound that occurs as a result of rubbing due to his dropfoot currently. Subsequently we have filled out the paperwork for his new external brace but again that does take several weeks to get that completed once everything is returned to back we did send this back last week as far as the paperwork was concerned. With that being said there does not appear to be any signs of active infection at this time. ANTHONYMICHAEL, MUNDAY ([id number]) 06/04/2019 on evaluation today patient's wound actually appears to be doing slightly better compared to last week which is good news. He still states that his insurance unfortunately does not seem to want to cover any of his new brace. He is going to just pay for this himself. Nonetheless he is going by there today when he leaves here that is [employer] in [place] that is going to be making this brace for him. 06/18/2019 upon evaluation today patient fortunately does have his new brace which is the external AFO brace with bilateral uprights. Subsequently he seems to be doing much better in fact the wound already appears better. He has not even been using the felt he is just using a dressing over top of the wound and overall I am very pleased with how things seem to be progressing. There is no signs of active infection at this time which is great news and I think that the AFO is good to help him tremendously as far as trying to keep pressure off of the 06/25/2019 upon evaluation today patient seems to  be making good progress in regard to his wound. He has been tolerating the dressing changes without complication. Fortunately there is no signs of active infection at this time. No fevers, chills, nausea, vomiting, or diarrhea. 07/02/2019 upon evaluation today patient unfortunately had a blistered area on the bottom of his foot tracking more towards the midline of the plantar aspect. He does not seem to have issues with rubbing more laterally that he used to have but nonetheless this is still giving him some trouble and causing problems here. Fortunately there is no signs of active infection at this time. No fevers, chills, nausea, vomiting, or diarrhea. 07/09/2019 upon evaluation today patient appears to be doing well with regard to his plantar foot ulcer. Definitely this is much improved compared to last week's visit which again last week the issue there was that he had an area of callus buildup on the plantar aspect of his foot I was able to clean this away and after effectively doing so the wound seems to be doing much better which is great news. There is no signs of active infection at this time. 07/16/2019 upon evaluation today patient appears to be making some progress in my opinion with regard to his plantar foot ulcer. He has been tolerating the dressing changes without complication. Fortunately there is no evidence of active infection at this time which is great news. 07/23/2019 upon evaluation today patient appears to be doing well with regard to his wounds on the plantar foot. He has been tolerating the dressing changes without complication. These do seem to be healing. 08/06/2019 upon evaluation today patient's wound actually appears to be showing some signs of improvement though he still has a significant amount of callus buildup over this location. That is can require some debridement today. With that being said the actual wound openings seem to be dramatically improved which is great  news.  There is no signs of active infection at this time. No fevers, chills, nausea, vomiting, or diarrhea. 08/13/2019 upon evaluation today patient appears to be doing well with regard to his foot ulcer. He has been tolerating the dressing changes without complication. Fortunately there is no signs of active infection at this time. No fevers, chills, nausea, vomiting, or diarrhea. Objective Constitutional Well-nourished and well-hydrated in no acute distress. Vitals Time Taken: 11:19 AM, Height: 71 in, Weight: 205 lbs, BMI: 28.6, Temperature: 98.0 F, Pulse: 102 bpm, Respiratory Rate: 16 breaths/min, Blood Pressure: 160/85 mmHg. Respiratory normal breathing without difficulty. Psychiatric this patient is able to make decisions and demonstrates good insight into disease process. Alert and Oriented x 3. pleasant and cooperative. General Notes: His wound did require some sharp debridement to remove callus from the surface of the wound which he tolerated today without complication post debridement the wound bed appears to be doing much better which is great news. Overall I do feel like he has made excellent improvement since last week and the satellite lesion that was up and then no longer appears to be actually open today. Integumentary (Hair, Skin) Wound #5 status is Open. Original cause of wound was Pressure Injury. The wound is located on the Left,Lateral Foot. The wound measures 0.5cm length x 0.3cm width x 0.2cm depth; 0.118cm^2 area and 0.024cm^3 volume. There is Fat Layer (Subcutaneous Tissue) Exposed exposed. There is no tunneling or undermining noted. There is a medium amount of serosanguineous drainage noted. The wound margin is flat and intact. There is large (67-100%) pink granulation within the wound bed. There is no necrotic tissue within the wound bed. Assessment Active Problems ICD-10 Type 2 diabetes mellitus with foot ulcer ARVIS, ZWAHLEN. ([id number]) Type 2 diabetes mellitus with  diabetic polyneuropathy Non-pressure chronic ulcer of other part of left foot with fat layer exposed Foot drop, left foot Procedures Wound #5 Pre-procedure diagnosis of Wound #5 is a Diabetic Wound/Ulcer of the Lower Extremity located on the Left,Lateral Foot .Severity of Tissue Pre Debridement is: Fat layer exposed. There was a Excisional Skin/Subcutaneous Tissue Debridement with a total area of 0.15 sq cm performed by [name], Doyel Mulkern E., [name]. With the following instrument(s): Curette to remove Viable and Non-Viable tissue/material. Material removed includes Callus and Subcutaneous Tissue and after achieving pain control using Lidocaine. A time out was conducted at 11:32, prior to the start of the procedure. A Minimum amount of bleeding was controlled with Pressure. The procedure was tolerated well. Post Debridement Measurements: 0.5cm length x 0.3cm width x 0.2cm depth; 0.024cm^3 volume. Character of Wound/Ulcer Post Debridement is stable. Severity of Tissue Post Debridement is: Fat layer exposed. Post procedure Diagnosis Wound #5: Same as Pre-Procedure Plan Wound Cleansing: Wound #5 Left,Lateral Foot: May shower with protection. Anesthetic (add to Medication List): Wound #5 Left,Lateral Foot: Topical Lidocaine 4% cream applied to wound bed prior to debridement (In Clinic Only). Primary Wound Dressing: Wound #5 Left,Lateral Foot: Silver Alginate Secondary Dressing: Wound #5 Left,Lateral Foot: Boardered Foam Dressing Dressing Change Frequency: Wound #5 Left,Lateral Foot: Change dressing every other day. Follow-up Appointments: Wound #5 Left,Lateral Foot: Return Appointment in 1 week. Edema Control: Wound #5 Left,Lateral Foot: Elevate legs to the level of the heart and pump ankles as often as possible Additional Orders / Instructions: Wound #5 Left,Lateral Foot: Other: - Patient to follow-up with [employer] for adjustments to braces and shoes. Requested paperwork sent to  [employer]. 1. My suggestion at this time is good to be that  we go ahead and continue with the wound care measures as before utilizing the silver alginate dressing I still think that is keeping things nice and dry which is what we need at this point. 2. I am also can recommend that we continue with the patient's use of his external brace for foot drop and custom shoe obviously I think this is helping him as well. 3. I am also can recommend that we continue to use a border foam dressing which does provide some benefit for him as well as far as padding is concerned. We will see patient back for reevaluation in 1 week here in the clinic. If anything worsens or changes patient will contact our office for additional recommendations. Electronic Signature(s) Signed: 08/13/2019 12:52:35 PM By: Worthy Keeler [name] Entered By: Worthy Keeler on 08/13/2019 12:52:35 [name] ([id number]) -------------------------------------------------------------------------------- SuperBill Details Patient Name: [name] Date of Service: 08/13/2019 Medical Record Number: [id number] Patient Account Number: [id number] Date of Birth/Sex: 1946/02/13 (73 y.o. 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 Foot drop, left foot Facility Procedures CPT4 Code: 27741287 Description: 86767 - DEB SUBQ TISSUE 20 SQ CM/< Modifier: Quantity: 1 CPT4 Code: Description: ICD-10 Diagnosis Description 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

## 2019-08-13 NOTE — Progress Notes (Signed)
DAVIDLEE, JEANBAPTISTE ([id number]) Visit Report for 08/13/2019 Arrival Information Details Patient Name: [name], [name] Date of Service: 08/13/2019 11:00 AM Medical Record Number: [id number] Patient Account Number: [id number] Date of Birth/Sex: 09-Aug-1945 (73 y.o. M) Treating RN: Montey Hora Primary Care Cadarius Nevares: [name] Other Clinician: Referring Najah Liverman: [name] Treating Wing Gfeller/Extender: Melburn Hake, HOYT Weeks in Treatment: 26 Visit Information History Since Last Visit Added or deleted any medications: No Patient Arrived: Cane Any new allergies or adverse reactions: No Arrival Time: 11:16 Had a fall or experienced change in No Accompanied By: self activities of daily living that may affect Transfer Assistance: None risk of falls: Patient Identification Verified: Yes Signs or symptoms of abuse/neglect since last visito No Secondary Verification Process Completed: Yes Hospitalized since last visit: No Patient Has Alerts: Yes Implantable device outside of the clinic excluding No Patient Alerts: DMII cellular tissue based products placed in the center since last visit: Has Dressing in Place as Prescribed: Yes Pain Present Now: No Electronic Signature(s) Signed: 08/13/2019 4:26:56 PM By: Montey Hora Entered By: Montey Hora on 08/13/2019 11:17:45 [name] ([id number]) -------------------------------------------------------------------------------- Encounter Discharge Information Details Patient Name: [name] Date of Service: 08/13/2019 11:00 AM Medical Record Number: [id number] Patient Account Number: [id number] Date of Birth/Sex: 19-Oct-1945 (73 y.o. M) Treating RN: Army Melia Primary Care Meloney Feld: [name] Other Clinician: Referring Lenna Hagarty: [name] Treating Biran Mayberry/Extender: Melburn Hake, HOYT Weeks in Treatment: 26 Encounter Discharge Information Items Post Procedure Vitals Discharge Condition: Stable Temperature  (F): 98.0 Ambulatory Status: Ambulatory Pulse (bpm): 102 Discharge Destination: Home Respiratory Rate (breaths/min): 16 Transportation: Private Auto Blood Pressure (mmHg): 160/85 Accompanied By: self Schedule Follow-up Appointment: Yes Clinical Summary of Care: Electronic Signature(s) Signed: 08/13/2019 4:19:51 PM By: Army Melia Entered By: Army Melia on 08/13/2019 11:36:19 [name] ([id number]) -------------------------------------------------------------------------------- Lower Extremity Assessment Details Patient Name: [name] Date of Service: 08/13/2019 11:00 AM Medical Record Number: [id number] Patient Account Number: [id number] Date of Birth/Sex: 1945-08-05 (73 y.o. M) Treating RN: Montey Hora Primary Care Shakeema Lippman: [name] Other Clinician: Referring Nakeisha Greenhouse: [name] Treating Lulia Schriner/Extender: [name], HOYT Weeks in Treatment: 26 Edema Assessment Assessed: [Left: No] [Right: No] Edema: [Left: N] [Right: o] Vascular Assessment Pulses: Dorsalis Pedis Palpable: [Left:Yes] Electronic Signature(s) Signed: 08/13/2019 4:26:56 PM By: Montey Hora Entered By: Montey Hora on 08/13/2019 11:20:54 [name] ([id number]) -------------------------------------------------------------------------------- Multi Wound Chart Details Patient Name: [name] Date of Service: 08/13/2019 11:00 AM Medical Record Number: [id number] Patient Account Number: [id number] Date of Birth/Sex: Jul 10, 1945 (73 y.o. M) Treating RN: Army Melia Primary Care Sonji Starkes: [name] Other Clinician: Referring Samie Barclift: [name] Treating Neziah Vogelgesang/Extender: Melburn Hake, HOYT Weeks in Treatment: 26 Vital Signs Height(in): 71 Pulse(bpm): 102 Weight(lbs): 205 Blood Pressure(mmHg): 160/85 Body Mass Index(BMI): 29 Temperature(F): 98.0 Respiratory Rate(breaths/min): 16 Photos: [N/A:N/A] Wound Location: Left, Lateral Foot N/A  N/A Wounding Event: Pressure Injury N/A N/A Primary Etiology: Diabetic Wound/Ulcer of the Lower N/A N/A Extremity Comorbid History: Cataracts, Middle ear problems, N/A N/A Sleep Apnea, Hypertension, Type II Diabetes, Gout, Osteoarthritis, Neuropathy Date Acquired: 01/19/2019 N/A N/A Weeks of Treatment: 26 N/A N/A Wound Status: Open N/A N/A Measurements L x W x D (cm) 0.5x0.3x0.2 N/A N/A Area (cm) : 0.118 N/A N/A Volume (cm) : 0.024 N/A N/A % Reduction in Area: 57.10% N/A N/A % Reduction in Volume: 70.70% N/A N/A Classification: Grade 2 N/A N/A Exudate Amount: Medium N/A N/A Exudate Type: Serosanguineous N/A N/A Exudate Color: red, brown N/A N/A Wound Margin: Flat and Intact  N/A N/A Granulation Amount: Large (67-100%) N/A N/A Granulation Quality: Pink N/A N/A Necrotic Amount: None Present (0%) N/A N/A Exposed Structures: Fat Layer (Subcutaneous Tissue) N/A N/A Exposed: Yes Fascia: No Tendon: No Muscle: No Joint: No Bone: No Epithelialization: Small (1-33%) N/A N/A Treatment Notes Electronic Signature(s) Signed: 08/13/2019 4:19:51 PM By: Army Melia Entered By: Army Melia on 08/13/2019 11:33:16 [name] ([id number]) -------------------------------------------------------------------------------- Multi-Disciplinary Care Plan Details Patient Name: [name] Date of Service: 08/13/2019 11:00 AM Medical Record Number: [id number] Patient Account Number: [id number] Date of Birth/Sex: 1946-04-10 (73 y.o. M) Treating RN: Army Melia Primary Care Branko Steeves: [name] Other Clinician: Referring Irvin Lizama: [name] Treating Markevius Trombetta/Extender: Melburn Hake, HOYT Weeks in Treatment: 26 Active Inactive Pressure Nursing Diagnoses: Knowledge deficit related to management of pressures ulcers Goals: Patient will remain free from development of additional pressure ulcers Date Initiated: 04/30/2019 Target Resolution Date: 05/14/2019 Goal Status:  Active Patient/caregiver will verbalize understanding of pressure ulcer management Date Initiated: 04/30/2019 Target Resolution Date: 05/14/2019 Goal Status: Active Interventions: Assess: immobility, friction, shearing, incontinence upon admission and as needed Treatment Activities: Patient referred for pressure reduction/relief devices : 04/30/2019 Notes: Wound/Skin Impairment Nursing Diagnoses: Impaired tissue integrity Goals: Ulcer/skin breakdown will have a volume reduction of 30% by week 4 Date Initiated: 04/30/2019 Target Resolution Date: 05/31/2019 Goal Status: Active Interventions: Assess patient/caregiver ability to obtain necessary supplies Assess ulceration(s) every visit Treatment Activities: Referred to DME Thien Berka for dressing supplies : 04/30/2019 Skin care regimen initiated : 04/30/2019 Topical wound management initiated : 04/30/2019 Notes: Electronic Signature(s) Signed: 08/13/2019 4:19:51 PM By: Army Melia Entered By: Army Melia on 08/13/2019 11:32:06 [name] ([id number]) -------------------------------------------------------------------------------- Pain Assessment Details Patient Name: [name] Date of Service: 08/13/2019 11:00 AM Medical Record Number: [id number] Patient Account Number: [id number] Date of Birth/Sex: Mar 10, 1946 (73 y.o. M) Treating RN: Montey Hora Primary Care Landy Dunnavant: [name] Other Clinician: Referring Elane Peabody: [name] Treating Ely Spragg/Extender: Melburn Hake, HOYT Weeks in Treatment: 26 Active Problems Location of Pain Severity and Description of Pain Patient Has Paino No Site Locations Pain Management and Medication Current Pain Management: Electronic Signature(s) Signed: 08/13/2019 4:26:56 PM By: Montey Hora Entered By: Montey Hora on 08/13/2019 11:18:05 [name] ([id number]) -------------------------------------------------------------------------------- Patient/Caregiver Education  Details Patient Name: [name] Date of Service: 08/13/2019 11:00 AM Medical Record Number: [id number] Patient Account Number: [id number] Date of Birth/Gender: March 25, 1946 (73 y.o. M) Treating RN: Army Melia Primary Care Physician: [name] Other Clinician: Referring Physician: [name] Treating Physician/Extender: Sharalyn Ink in Treatment: 26 Education Assessment Education Provided To: Patient Education Topics Provided Wound/Skin Impairment: Handouts: Caring for Your Ulcer Methods: Demonstration, Explain/Verbal Responses: State content correctly Electronic Signature(s) Signed: 08/13/2019 4:19:51 PM By: Army Melia Entered By: Army Melia on 08/13/2019 11:34:49 [name] ([id number]) -------------------------------------------------------------------------------- Wound Assessment Details Patient Name: [name] Date of Service: 08/13/2019 11:00 AM Medical Record Number: [id number] Patient Account Number: [id number] Date of Birth/Sex: 06/01/1945 (73 y.o. M) Treating RN: Montey Hora Primary Care Katai Marsico: [name] Other Clinician: Referring Agnes Probert: [name] Treating Trinika Cortese/Extender: [name], HOYT Weeks in Treatment: 26 Wound Status Wound Number: 5 Primary Diabetic Wound/Ulcer of the Lower Extremity Etiology: Wound Location: Left, Lateral Foot Wound Open Wounding Event: Pressure Injury Status: Date Acquired: 01/19/2019 Comorbid Cataracts, Middle ear problems, Sleep Apnea, Weeks Of Treatment: 26 History: Hypertension, Type II Diabetes, Gout, Osteoarthritis, Clustered Wound: No Neuropathy Photos Wound Measurements Length: (cm) 0.5 % Redu Width: (cm) 0.3 % Redu Depth: (cm) 0.2 Epithe Area: (  cm) 0.118 Tunne Volume: (cm) 0.024 Under ction in Area: 57.1% ction in Volume: 70.7% lialization: Small (1-33%) ling: No mining: No Wound Description Classification: Grade 2 Foul O Wound Margin: Flat and  Intact Slough Exudate Amount: Medium Exudate Type: Serosanguineous Exudate Color: red, brown dor After Cleansing: No /Fibrino No Wound Bed Granulation Amount: Large (67-100%) Exposed Structure Granulation Quality: Pink Fascia Exposed: No Necrotic Amount: None Present (0%) Fat Layer (Subcutaneous Tissue) Exposed: Yes Tendon Exposed: No Muscle Exposed: No Joint Exposed: No Bone Exposed: No Treatment Notes Wound #5 (Left, Lateral Foot) Notes [employer], telfa Electronic Signature(s) AAMARI, STRAWDERMAN ([id number]) Signed: 08/13/2019 4:26:56 PM By: Montey Hora Entered By: Montey Hora on 08/13/2019 11:22:25 [name] ([id number]) -------------------------------------------------------------------------------- Vitals Details Patient Name: [name] Date of Service: 08/13/2019 11:00 AM Medical Record Number: [id number] Patient Account Number: [id number] Date of Birth/Sex: 06-03-1945 (73 y.o. M) Treating RN: Montey Hora Primary Care Kizzie Cotten: [name] Other Clinician: Referring Miyu Fenderson: [name] Treating Dakisha Schoof/Extender: Melburn Hake, HOYT Weeks in Treatment: 26 Vital Signs Time Taken: 11:19 Temperature (F): 98.0 Height (in): 71 Pulse (bpm): 102 Weight (lbs): 205 Respiratory Rate (breaths/min): 16 Body Mass Index (BMI): 28.6 Blood Pressure (mmHg): 160/85 Reference Range: 80 - 120 mg / dl Electronic Signature(s) Signed: 08/13/2019 4:26:56 PM By: Montey Hora Entered By: Montey Hora on 08/13/2019 11:20:43

## 2019-08-15 ENCOUNTER — Ambulatory Visit (INDEPENDENT_AMBULATORY_CARE_PROVIDER_SITE_OTHER): Payer: Medicare Other

## 2019-08-15 ENCOUNTER — Other Ambulatory Visit: Payer: Self-pay

## 2019-08-15 DIAGNOSIS — E291 Testicular hypofunction: Secondary | ICD-10-CM

## 2019-08-15 MED ORDER — TESTOSTERONE CYPIONATE 200 MG/ML IM SOLN
150.0000 mg | INTRAMUSCULAR | Status: DC
  Administered 2019-08-15: 150 mg via INTRAMUSCULAR

## 2019-08-15 NOTE — Progress Notes (Signed)
Per orders of Dr. Lorelei Pont, injection of testosterone given by Randall An. Patient tolerated injection well.

## 2019-08-17 ENCOUNTER — Other Ambulatory Visit: Payer: Self-pay | Admitting: Internal Medicine

## 2019-08-20 ENCOUNTER — Encounter: Payer: Medicare Other | Admitting: Physician Assistant

## 2019-08-20 ENCOUNTER — Other Ambulatory Visit: Payer: Self-pay

## 2019-08-20 DIAGNOSIS — E11621 Type 2 diabetes mellitus with foot ulcer: Secondary | ICD-10-CM | POA: Diagnosis not present

## 2019-08-20 DIAGNOSIS — L84 Corns and callosities: Secondary | ICD-10-CM | POA: Diagnosis not present

## 2019-08-20 DIAGNOSIS — L97522 Non-pressure chronic ulcer of other part of left foot with fat layer exposed: Secondary | ICD-10-CM | POA: Diagnosis not present

## 2019-08-20 DIAGNOSIS — E1142 Type 2 diabetes mellitus with diabetic polyneuropathy: Secondary | ICD-10-CM | POA: Diagnosis not present

## 2019-08-20 DIAGNOSIS — M21372 Foot drop, left foot: Secondary | ICD-10-CM | POA: Diagnosis not present

## 2019-08-20 NOTE — Progress Notes (Addendum)
ALANZO, LAMB ([id number]) Visit Report for 08/20/2019 Chief Complaint Document Details Patient Name: [name], [name] Date of Service: 08/20/2019 11:00 AM Medical Record Number: [id number] Patient Account Number: [id number] Date of Birth/Sex: [birth year] (74 y.o. M) Treating RN: Army Melia Primary Care Provider: [name] Other Clinician: Referring Provider: [name] Treating Provider/Extender: Melburn Hake, Arline Ketter Weeks in Treatment: 27 Information Obtained from: Patient Chief Complaint Left dorsal foot ulcer 02/09/2019; patient is here for review of the wound on the left lateral foot roughly at the level of the base of the fifth metatarsal Electronic Signature(s) Signed: 08/20/2019 11:16:40 AM By: Worthy Keeler [name] Entered By: Worthy Keeler on 08/20/2019 [place], [place]. ([id number]) -------------------------------------------------------------------------------- Debridement Details Patient Name: [name] Date of Service: 08/20/2019 11:00 AM Medical Record Number: [id number] Patient Account Number: [id number] Date of Birth/Sex: 02/13/1946 (74 y.o. M) Treating RN: Army Melia Primary Care Provider: [name] Other Clinician: Referring Provider: [name] Treating Provider/Extender: Melburn Hake, Asad Keeven Weeks in Treatment: 27 Debridement Performed for Wound #5 Left,Lateral Foot Assessment: Performed By: Physician [name], Harvir Patry E., [name] Debridement Type: Debridement Severity of Tissue Pre Debridement: Fat layer exposed Level of Consciousness (Pre- Awake and Alert procedure): Pre-procedure Verification/Time Out Yes - 11:21 Taken: Start Time: 11:22 Pain Control: Lidocaine Total Area Debrided (L x W): 0.5 (cm) x 0.2 (cm) = 0.1 (cm) Tissue and other material Viable, Non-Viable, Callus, Subcutaneous debrided: Level: Skin/Subcutaneous Tissue Debridement Description: Excisional Instrument: Curette Bleeding: Minimum Hemostasis  Achieved: Pressure End Time: 11:24 Response to Treatment: Procedure was tolerated well Level of Consciousness (Post- Awake and Alert procedure): Post Debridement Measurements of Total Wound Length: (cm) 0.5 Width: (cm) 0.2 Depth: (cm) 0.2 Volume: (cm) 0.016 Character of Wound/Ulcer Post Debridement: Stable Severity of Tissue Post Debridement: Fat layer exposed Post Procedure Diagnosis Same as Pre-procedure Electronic Signature(s) Signed: 08/20/2019 4:16:16 PM By: Army Melia Signed: 08/21/2019 11:00:46 AM By: Worthy Keeler [name] Entered By: Army Melia on 08/20/2019 11:24:25 [name] ([id number]) -------------------------------------------------------------------------------- HPI Details Patient Name: [name] Date of Service: 08/20/2019 11:00 AM Medical Record Number: [id number] Patient Account Number: [id number] Date of Birth/Sex: 06-26-1945 (74 y.o. M) Treating RN: Army Melia Primary Care Provider: [name] Other Clinician: Referring Provider: [name] Treating Provider/Extender: Melburn Hake, Dylan Monforte Weeks in Treatment: 27 History of Present Illness HPI Description: 10/04/16 on evaluation today patient presents for initial evaluation concerning a laceration of the right wrist and hand on the bowler aspect. He tells me that he fell into glass following a medication change in regard to his diabetes. He initially went to [facility] where she was sutured and subsequently his daughter who is a Marine scientist removed the teachers at the appropriate time. Unfortunately the wound has done poorly and dehissed which has caused him some trouble including infection. He has been on antibiotics both topically and orally though the wound continues to show signs of necrotic tissue according to notes which is what he was sent to [place] for evaluation concerning. He does have diabetes and unfortunately this is severely uncontrolled. He does see an endocrinologist and is  working on this. 10/11/2016 -- the patient's notes from the ER were reviewed and he had his suturing done on 09/17/2016 and had necrotic skin flaps which had to be trimmed the last visit he was here. He has managed to get his Santyl ointment and has been using this appropriately. 10/21/16 patient's wound appears to be doing very well on evaluation today. He still has  some Slough covering the wound bed but this is nowhere near as severe as when i saw this initially two weeks ago nor even my review of his pictures last week. I'm pleased with how this is progressing. Patient's wound over the right form appears to be doing fairly well on evaluation today. There is no evidence of infection and he has a small area at the crease of where she is wrist extends and flexes that is still open and appears to be having difficulty due to the fact that it is cracking open. I think this is something that may be controlled with moisture control that is keeping the wound a little bit more moist. Nonetheless this is a tiny region compared to the initial wound and overall appears to be doing very well. No fevers, chills, nausea, or vomiting noted at this time. 11/25/16 on evaluation today patient appears to be doing well in regard to his right wrist ulcer. In fact this appears to be completely healed he is having no discomfort. He is pleased with how this is progressed. Readmission: 06/24/17 on evaluation today patient appears back in our clinic regarding issues that he has been having with his right ankle on the lateral malleolus as well is in the dorsal surface of his left foot. Both have been going on for several weeks unfortunately. He tells me that initially this was being managed by Dr. Edilia Bo and I did review the notes from Dr. Edilia Bo in epic. Patient was placed on amoxicillin initially and then subsequently Keflex following. He has been using triple antibiotic ointment over-the-counter along with a Band-Aid for  the wound currently. He did not have any Santyl remaining. His most recent hemoglobin A1c which was just two days ago was 10.3. Fortunately he seems to have excellent blood flow in the bilateral lower extremities with his left ABI being 0.95 in the right ABI 1.01. Patient does have some pain in regard to each of these ulcerations but he tells me it's not nearly as severe as the hand wound that I help treat earlier over the summer. This was in 2018. 07/15/17 on evaluation today patient's lower surety ulcer is actually both appear to be doing fairly well at this point. He does have a little bit of skin overlapping the ulcer on the right lateral ankle this was debrided away today just with saline and gauze without complication patient had no discomfort or pain. Otherwise patient's left foot ulcer does appear to be showing signs of improvement he does seem to have a right great toe. Nikki and noted at this point in time. I think that this does need to be managed at this point. No fevers, chills, nausea, or vomiting noted at this time. 07/22/17 on evaluation today patient's wounds in regard to his lower extremities appear to be doing somewhat better. His right ankle wound in fact appears to likely be completely closed. The left dorsal foot ulcer though still open does appear to be a little bit smaller and does also seem to be making good progress. He has not been having problems with the treatment at this point in the general he does not seem to show as much erythema surrounding the wound bed which is good news. He is not having significant discomfort. 08/12/17 on evaluation today patients wounds of both appeared to be doing better in fact they both appear to be completely healed. Overall I'm very pleased with how things have progressed over the past week even more than what  I expected. Obviously this is good news and he is happy about this. He did have a fall when going to his orthopedic doctor recently  fortunately he does not appear to have injured anything severely but nonetheless does have a couple of bumps and bruises nothing significant at this time but he needs to be seen and wound care for. He may have broken his left wrist he is following up with orthopedics in that regard. Readmission: 10/24/17 on evaluation today patient presents for reevaluation after having been discharged Aug 12, 2017. His wound was healed at that point although he states that it has been somewhat discolored since that time. He has not experienced the drainage as far as the wound is concerned but the fact that it was still discolored been greater than two months after the fact made him want to come in for reevaluation. Upon inspection today the patient has no pain although he does have neuropathy. He does have a discolored region of the dorsal surface of the left foot which was the site where the ulcer was that we previously treated. The good news is he does not again have any discomfort although obviously I do believe this may be some scar tissue and just discoloration in that regard. Again he hasn't had any drainage or discharge since I last saw him and at this point I see no evidence of that either this area shows complete epithelialization. In general I do not feel like there's any significant issue at this point in that regard. 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 He has been in this clinic before in 2018 with a laceration on his right hand and then again in 2019 with an area over the right lateral malleolus. 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 He states that the wound is been there for the last week or JAQUARIUS, SEDER. ([id number]) 2. It is been uncomfortable to walk on. The man is an active man having to look after an elderly  mother. Past medical history, type 2 diabetes with polyneuropathy with a recent hemoglobin A1c in September 2011 0.3, lumbar disc disease, hypergonadism., Left foot drop and gout. We did not repeat his ABIs in the clinic quoting values from July 2019 of 0.95 and 1.01. He is not felt to have significant PAD 02/19/2019 on evaluation today patient actually appears to be doing better with regard to his foot wound based on what I am seeing currently. He was seen by Dr. Dellia Nims on readmission back into the clinic last week when I was off. The good news is the patient has been tolerating the dressing changes without any complication in fact has not really had much in the way of drainage. He does have an AFO brace which she believes is actually pushing on this area as well and has contributed to the issue. Fortunately there is no signs of active infection at this point. His ABIs appear to be well. 03/05/2019 on evaluation today patient appears to be doing well in regard to his foot ulcer. He has been tolerating the dressing changes without complication. Fortunately there is no signs of active infection. The wound is measuring somewhat smaller and overall I feel like he is making good progress at this point. 03/12/2019 on evaluation today patient actually appears to be doing quite well with regard to  his foot ulcer. The wound is looking better the measurement is not significantly better at this point unfortunately but nonetheless again the overall appearance is improving I think he is making some progress. I still think the big issue here is foot drop and again the patient wants to see if potentially he could get a brace to help with this. Subsequently I think that we can definitely make a referral to [facility] to see if they can help [place] in this regard. He is in agreement with that plan. 03/26/2019 on evaluation today patient appears to be doing more poorly in regard to his foot ulcer at this time. He did see  podiatry in order to see about getting a brace but unfortunately he tells me that Medicare is not likely to pay for one for him as he had 1 I guess 8 months ago. However this one that apparently goes in his shoes that does not seem to be working out well for him. Unfortunately there are signs of active infection at this time. No fevers, chills, nausea, vomiting, or diarrhea. Systemically. The patient has erythema on the dorsal surface of his foot that tracks from the lateral portion of his foot and again he does have a blister around the wound as well which is worse compared to previous. Overall I am concerned. 04/02/2019 on evaluation today patient appears to be doing about the same with regard to his wound. Unfortunately there is no signs of significant improvement although I do think there is no signs of infection which is good news. His drainage is also slowed down tremendously. I think at this point he would benefit from the initiation of a total contact cast. The only concern I have is that over his balance. I explained to the patient that if we were to initiate the cast I think it would be greatly beneficial for him but he would need to ensure not to walk at all unless he uses a walker he actually has 2 walkers one for inside his home and one that he keeps out in the car. For that reason he would be covered in that regard. I just do not want him to have any accidents and fall with the cast. 12/28-Patient comes in 1 week out a day early for his appointment he was in the hospital for 2 days for uncontrolled hyperglycemia and required management of high blood sugars, with some adjustments in his home regimen of insulin, patient stubbed his right great toe today and put a Band-Aid on it, he is here for his left lateral foot ulcer and we are using silver alginate dressing, he was intact total contact cast with difficulty with balance to the extent that that had to be discontinued 04/16/2019 on  evaluation today patient appears to be doing better with regard to the overall appearance of his wound size. This is good news. We had attempted a total contact cast but unfortunately he had some falling issues that the family contacted [place] about we took the cast off we did not put it back on. Nonetheless it ended up that just a couple days later he was in the hospital due to elevations in his blood sugar which went to around 500 and even a little higher. Subsequently he was admitted to the hospital and was in the hospital for a total of 3 days and that included Christmas day. Fortunately there got his blood sugar under control he tells me that this is running about 200 now  which is much better news. There does not appear to be any signs of active infection at this time. No fevers, chills, nausea, vomiting, or diarrhea. 04/30/2019 on evaluation today patient actually appears to be doing slightly worse compared to previous as far as the overall size of his wound is concerned. There also is some need for sharp debridement today as well. Fortunately there is no evidence of active infection which is good news. No fevers, chills, nausea, vomiting, or diarrhea. 05/07/2019 upon evaluation today patient appears to be doing excellent in regard to his plantar foot ulcer. He has been tolerating the dressing changes without complication. Fortunately there is no signs of active infection at this time. No fevers, chills, nausea, vomiting, or diarrhea. He does tell me that he actually got in touch with [employer] and they feel like they can add diagnosis codes to his orders in order to get an external brace for him as opposed to the one that goes internal to issue. That would be excellent for his foot drop and I did tell him that if there is anything I need to fill out in that regard I will be more than happy to oblige and fill out what is needed for [employer]. 05/14/2019 upon evaluation today patient's original wound site  actually appears to be doing much better which is good news. Fortunately there is no signs of active infection at this time. No fevers, chills, nausea, vomiting, or diarrhea. With that being said the patient unfortunately is continuing to have issues with rubbing he tells me that the felt slid on his foot although he did not realize it until today. Nonetheless I think this did cause a blister around the edge of the wound and he does an open wound that is slightly larger overall in measurement compared to last time mainly due to this blister. Other than that I feel like he is doing quite well. 05/21/2019 upon evaluation today patient appears to be doing excellent in regard to his wound at this time on the plantar foot. He is showing signs of improvement he does have some slough and biofilm noted on the surface of the wound this is going require some sharp debridement today as well. Fortunately there is no evidence of active infection. No fevers, chills, nausea, vomiting, or diarrhea. 05/28/2019 on evaluation today patient's wound actually appears to be measuring smaller upon initial inspection and I do believe that it is doing well. With that being said he continues to have a blister on the outside/lateral edge of the wound that occurs as a result of rubbing due to his dropfoot currently. Subsequently we have filled out the paperwork for his new external brace but again that does take several weeks to get that completed once everything is returned to back we did send this back last week as far as the paperwork was concerned. With that being said there does not appear to be any signs of active infection at this time. 06/04/2019 on evaluation today patient's wound actually appears to be doing slightly better compared to last week which is good news. He still states that his insurance unfortunately does not seem to want to cover any of his new brace. He is going to just pay for this himself. Nonetheless he is  going by there today when he leaves here that is [employer] in [place] that is going to be making this brace for him. 06/18/2019 upon evaluation today patient fortunately does have his new brace which is the external  AFO brace with bilateral uprights. Subsequently he seems to be doing much better in fact the wound already appears better. He has not even been using the felt he is just using a dressing over Hunker ([id number]) top of the wound and overall I am very pleased with how things seem to be progressing. There is no signs of active infection at this time which is great news and I think that the AFO is good to help him tremendously as far as trying to keep pressure off of the 06/25/2019 upon evaluation today patient seems to be making good progress in regard to his wound. He has been tolerating the dressing changes without complication. Fortunately there is no signs of active infection at this time. No fevers, chills, nausea, vomiting, or diarrhea. 07/02/2019 upon evaluation today patient unfortunately had a blistered area on the bottom of his foot tracking more towards the midline of the plantar aspect. He does not seem to have issues with rubbing more laterally that he used to have but nonetheless this is still giving him some trouble and causing problems here. Fortunately there is no signs of active infection at this time. No fevers, chills, nausea, vomiting, or diarrhea. 07/09/2019 upon evaluation today patient appears to be doing well with regard to his plantar foot ulcer. Definitely this is much improved compared to last week's visit which again last week the issue there was that he had an area of callus buildup on the plantar aspect of his foot I was able to clean this away and after effectively doing so the wound seems to be doing much better which is great news. There is no signs of active infection at this time. 07/16/2019 upon evaluation today patient appears to be making some  progress in my opinion with regard to his plantar foot ulcer. He has been tolerating the dressing changes without complication. Fortunately there is no evidence of active infection at this time which is great news. 07/23/2019 upon evaluation today patient appears to be doing well with regard to his wounds on the plantar foot. He has been tolerating the dressing changes without complication. These do seem to be healing. 08/06/2019 upon evaluation today patient's wound actually appears to be showing some signs of improvement though he still has a significant amount of callus buildup over this location. That is can require some debridement today. With that being said the actual wound openings seem to be dramatically improved which is great news. There is no signs of active infection at this time. No fevers, chills, nausea, vomiting, or diarrhea. 08/13/2019 upon evaluation today patient appears to be doing well with regard to his foot ulcer. He has been tolerating the dressing changes without complication. Fortunately there is no signs of active infection at this time. No fevers, chills, nausea, vomiting, or diarrhea. 08/20/2019 upon evaluation today patient appears to be doing excellent in regard to his foot ulcer. I do believe he still showing signs of improvement this is very slow but again I think still pressure getting to the area even with his brace and custom shoe is still part of the main issue with the delay here. Nonetheless I do not see any signs of worsening or infection all of which is good news. Electronic Signature(s) Signed: 08/20/2019 5:28:27 PM By: Worthy Keeler [name] Entered By: Worthy Keeler on 08/20/2019 17:28:27 [name] ([id number]) -------------------------------------------------------------------------------- Physical Exam Details Patient Name: [name] Date of Service: 08/20/2019 11:00 AM Medical Record Number: [id number] Patient  Account Number: [id number] Date  of Birth/Sex: [birth year] (74 y.o. M) Treating RN: Army Melia Primary Care Provider: [name] Other Clinician: Referring Provider: [name] Treating Provider/Extender: [name], Grecia Lynk Weeks in Treatment: 26 Constitutional Well-nourished and well-hydrated in no acute distress. Respiratory normal breathing without difficulty. Psychiatric this patient is able to make decisions and demonstrates good insight into disease process. Alert and Oriented x 3. pleasant and cooperative. Notes Upon inspection patient's wound bed actually showed signs of some callus buildup this did require sharp debridement to clear away today which I did without complication really there was only minimal bleeding noted post debridement the wound bed appears to be doing much better. Electronic Signature(s) Signed: 08/20/2019 5:28:48 PM By: Worthy Keeler [name] Entered By: Worthy Keeler on 08/20/2019 17:28:47 [name] ([id number]) -------------------------------------------------------------------------------- Physician Orders Details Patient Name: [name] Date of Service: 08/20/2019 11:00 AM Medical Record Number: [id number] Patient Account Number: [id number] Date of Birth/Sex: 1946-04-12 (74 y.o. M) Treating RN: Army Melia Primary Care Provider: [name] Other Clinician: Referring Provider: [name] Treating Provider/Extender: Melburn Hake, Giovonni Poirier Weeks in Treatment: 40 Verbal / Phone Orders: No Diagnosis Coding ICD-10 Coding Code Description E11.621 Type 2 diabetes mellitus with foot ulcer E11.42 Type 2 diabetes mellitus with diabetic polyneuropathy L97.522 Non-pressure chronic ulcer of other part of left foot with fat layer exposed M21.372 Foot drop, left foot Wound Cleansing Wound #5 Left,Lateral Foot o May shower with protection. Anesthetic (add to Medication List) Wound #5 Left,Lateral Foot o Topical Lidocaine 4% cream applied to wound bed prior to  debridement (In Clinic Only). Primary Wound Dressing Wound #5 Left,Lateral Foot o Silver Collagen Secondary Dressing Wound #5 Left,Lateral Foot o Boardered Foam Dressing Dressing Change Frequency Wound #5 Left,Lateral Foot o Change dressing every other day. Follow-up Appointments Wound #5 Left,Lateral Foot o Return Appointment in 1 week. Edema Control Wound #5 Left,Lateral Foot o Elevate legs to the level of the heart and pump ankles as often as possible Additional Orders / Instructions Wound #5 Left,Lateral Foot o Other: - Patient to follow-up with [employer] for adjustments to braces and shoes. Requested paperwork sent to [employer]. Electronic Signature(s) Signed: 08/20/2019 4:16:16 PM By: Army Melia Signed: 08/21/2019 11:00:46 AM By: Worthy Keeler [name] Entered By: Army Melia on 08/20/2019 11:25:12 [name] ([id number]) -------------------------------------------------------------------------------- Problem List Details Patient Name: [name] Date of Service: 08/20/2019 11:00 AM Medical Record Number: [id number] Patient Account Number: [id number] Date of Birth/Sex: Aug 08, 1945 (74 y.o. M) Treating RN: Army Melia Primary Care Provider: [name] Other Clinician: Referring Provider: [name] Treating Provider/Extender: Melburn Hake, Jacqueline Spofford Weeks in Treatment: 27 Active Problems ICD-10 Encounter Code Description Active Date MDM Diagnosis E11.621 Type 2 diabetes mellitus with foot ulcer 02/09/2019 No Yes E11.42 Type 2 diabetes mellitus with diabetic polyneuropathy 02/09/2019 No Yes L97.522 Non-pressure chronic ulcer of other part of left foot with fat layer 02/09/2019 No Yes exposed M21.372 Foot drop, left foot 02/09/2019 No Yes Inactive Problems Resolved Problems Electronic Signature(s) Signed: 08/20/2019 11:16:34 AM By: Worthy Keeler [name] Entered By: Worthy Keeler on 08/20/2019 11:16:32 [name]  ([id number]) -------------------------------------------------------------------------------- Progress Note Details Patient Name: [name] Date of Service: 08/20/2019 11:00 AM Medical Record Number: [id number] Patient Account Number: [id number] Date of Birth/Sex: 09/02/1945 (74 y.o. M) Treating RN: Army Melia Primary Care Provider: [name] Other Clinician: Referring Provider: [name] Treating Provider/Extender: Melburn Hake, Tayen Narang Weeks in Treatment: 27 Subjective Chief Complaint Information obtained from Patient Left dorsal  foot ulcer 02/09/2019; patient is here for review of the wound on the left lateral foot roughly at the level of the base of the fifth metatarsal History of Present Illness (HPI) 10/04/16 on evaluation today patient presents for initial evaluation concerning a laceration of the right wrist and hand on the bowler aspect. He tells me that he fell into glass following a medication change in regard to his diabetes. He initially went to [facility] where she was sutured and subsequently his daughter who is a Marine scientist removed the teachers at the appropriate time. Unfortunately the wound has done poorly and dehissed which has caused him some trouble including infection. He has been on antibiotics both topically and orally though the wound continues to show signs of necrotic tissue according to notes which is what he was sent to [place] for evaluation concerning. He does have diabetes and unfortunately this is severely uncontrolled. He does see an endocrinologist and is working on this. 10/11/2016 -- the patient's notes from the ER were reviewed and he had his suturing done on 09/17/2016 and had necrotic skin flaps which had to be trimmed the last visit he was here. He has managed to get his Santyl ointment and has been using this appropriately. 10/21/16 patient's wound appears to be doing very well on evaluation today. He still has some Slough covering the wound  bed but this is nowhere near as severe as when i saw this initially two weeks ago nor even my review of his pictures last week. I'm pleased with how this is progressing. Patient's wound over the right form appears to be doing fairly well on evaluation today. There is no evidence of infection and he has a small area at the crease of where she is wrist extends and flexes that is still open and appears to be having difficulty due to the fact that it is cracking open. I think this is something that may be controlled with moisture control that is keeping the wound a little bit more moist. Nonetheless this is a tiny region compared to the initial wound and overall appears to be doing very well. No fevers, chills, nausea, or vomiting noted at this time. 11/25/16 on evaluation today patient appears to be doing well in regard to his right wrist ulcer. In fact this appears to be completely healed he is having no discomfort. He is pleased with how this is progressed. Readmission: 06/24/17 on evaluation today patient appears back in our clinic regarding issues that he has been having with his right ankle on the lateral malleolus as well is in the dorsal surface of his left foot. Both have been going on for several weeks unfortunately. He tells me that initially this was being managed by Dr. Edilia Bo and I did review the notes from Dr. Edilia Bo in epic. Patient was placed on amoxicillin initially and then subsequently Keflex following. He has been using triple antibiotic ointment over-the-counter along with a Band-Aid for the wound currently. He did not have any Santyl remaining. His most recent hemoglobin A1c which was just two days ago was 10.3. Fortunately he seems to have excellent blood flow in the bilateral lower extremities with his left ABI being 0.95 in the right ABI 1.01. Patient does have some pain in regard to each of these ulcerations but he tells me it's not nearly as severe as the hand wound that I  help treat earlier over the summer. This was in 2018. 07/15/17 on evaluation today patient's lower surety ulcer  is actually both appear to be doing fairly well at this point. He does have a little bit of skin overlapping the ulcer on the right lateral ankle this was debrided away today just with saline and gauze without complication patient had no discomfort or pain. Otherwise patient's left foot ulcer does appear to be showing signs of improvement he does seem to have a right great toe. Nikki and noted at this point in time. I think that this does need to be managed at this point. No fevers, chills, nausea, or vomiting noted at this time. 07/22/17 on evaluation today patient's wounds in regard to his lower extremities appear to be doing somewhat better. His right ankle wound in fact appears to likely be completely closed. The left dorsal foot ulcer though still open does appear to be a little bit smaller and does also seem to be making good progress. He has not been having problems with the treatment at this point in the general he does not seem to show as much erythema surrounding the wound bed which is good news. He is not having significant discomfort. 08/12/17 on evaluation today patients wounds of both appeared to be doing better in fact they both appear to be completely healed. Overall I'm very pleased with how things have progressed over the past week even more than what I expected. Obviously this is good news and he is happy about this. He did have a fall when going to his orthopedic doctor recently fortunately he does not appear to have injured anything severely but nonetheless does have a couple of bumps and bruises nothing significant at this time but he needs to be seen and wound care for. He may have broken his left wrist he is following up with orthopedics in that regard. Readmission: 10/24/17 on evaluation today patient presents for reevaluation after having been discharged Aug 12, 2017. His  wound was healed at that point although he states that it has been somewhat discolored since that time. He has not experienced the drainage as far as the wound is concerned but the fact that it was still discolored been greater than two months after the fact made him want to come in for reevaluation. Upon inspection today the patient has no pain although he does have neuropathy. He does have a discolored region of the dorsal surface of the left foot which was the site where the ulcer was that we previously treated. The good news is he does not again have any discomfort although obviously I do believe this may be some scar tissue and just discoloration in that regard. Again he hasn't had any drainage or discharge since I last saw him and at this point I see no evidence of that either this area shows complete epithelialization. In general I do not feel like there's any significant issue at this point in that regard. KITO, CUFFE ([id number]) READMISSION 02/09/2019 This is a 74 year old man with type 2 diabetes poorly controlled with significant peripheral neuropathy. He has been in this clinic before in 2018 with a laceration on his right hand and then again in 2019 with an area over the right lateral malleolus. 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 He states that the wound is been there for the last week or 2. It is been uncomfortable to walk on. The man is an active man having to look after an elderly  mother. Past medical history, type 2 diabetes with polyneuropathy with a recent hemoglobin A1c in September 2011 0.3, lumbar disc disease, hypergonadism., Left foot drop and gout. We did not repeat his ABIs in the clinic quoting values from July 2019 of 0.95 and 1.01. He is not felt to have significant PAD 02/19/2019 on evaluation today patient actually appears to be doing better with regard to his foot wound  based on what I am seeing currently. He was seen by Dr. Dellia Nims on readmission back into the clinic last week when I was off. The good news is the patient has been tolerating the dressing changes without any complication in fact has not really had much in the way of drainage. He does have an AFO brace which she believes is actually pushing on this area as well and has contributed to the issue. Fortunately there is no signs of active infection at this point. His ABIs appear to be well. 03/05/2019 on evaluation today patient appears to be doing well in regard to his foot ulcer. He has been tolerating the dressing changes without complication. Fortunately there is no signs of active infection. The wound is measuring somewhat smaller and overall I feel like he is making good progress at this point. 03/12/2019 on evaluation today patient actually appears to be doing quite well with regard to his foot ulcer. The wound is looking better the measurement is not significantly better at this point unfortunately but nonetheless again the overall appearance is improving I think he is making some progress. I still think the big issue here is foot drop and again the patient wants to see if potentially he could get a brace to help with this. Subsequently I think that we can definitely make a referral to [facility] to see if they can help [place] in this regard. He is in agreement with that plan. 03/26/2019 on evaluation today patient appears to be doing more poorly in regard to his foot ulcer at this time. He did see podiatry in order to see about getting a brace but unfortunately he tells me that Medicare is not likely to pay for one for him as he had 1 I guess 8 months ago. However this one that apparently goes in his shoes that does not seem to be working out well for him. Unfortunately there are signs of active infection at this time. No fevers, chills, nausea, vomiting, or diarrhea. Systemically. The patient has  erythema on the dorsal surface of his foot that tracks from the lateral portion of his foot and again he does have a blister around the wound as well which is worse compared to previous. Overall I am concerned. 04/02/2019 on evaluation today patient appears to be doing about the same with regard to his wound. Unfortunately there is no signs of significant improvement although I do think there is no signs of infection which is good news. His drainage is also slowed down tremendously. I think at this point he would benefit from the initiation of a total contact cast. The only concern I have is that over his balance. I explained to the patient that if we were to initiate the cast I think it would be greatly beneficial for him but he would need to ensure not to walk at all unless he uses a walker he actually has 2 walkers one for inside his home and one that he keeps out in the car. For that reason he would be covered in that regard. I just  do not want him to have any accidents and fall with the cast. 12/28-Patient comes in 1 week out a day early for his appointment he was in the hospital for 2 days for uncontrolled hyperglycemia and required management of high blood sugars, with some adjustments in his home regimen of insulin, patient stubbed his right great toe today and put a Band-Aid on it, he is here for his left lateral foot ulcer and we are using silver alginate dressing, he was intact total contact cast with difficulty with balance to the extent that that had to be discontinued 04/16/2019 on evaluation today patient appears to be doing better with regard to the overall appearance of his wound size. This is good news. We had attempted a total contact cast but unfortunately he had some falling issues that the family contacted [place] about we took the cast off we did not put it back on. Nonetheless it ended up that just a couple days later he was in the hospital due to elevations in his blood sugar which  went to around 500 and even a little higher. Subsequently he was admitted to the hospital and was in the hospital for a total of 3 days and that included Christmas day. Fortunately there got his blood sugar under control he tells me that this is running about 200 now which is much better news. There does not appear to be any signs of active infection at this time. No fevers, chills, nausea, vomiting, or diarrhea. 04/30/2019 on evaluation today patient actually appears to be doing slightly worse compared to previous as far as the overall size of his wound is concerned. There also is some need for sharp debridement today as well. Fortunately there is no evidence of active infection which is good news. No fevers, chills, nausea, vomiting, or diarrhea. 05/07/2019 upon evaluation today patient appears to be doing excellent in regard to his plantar foot ulcer. He has been tolerating the dressing changes without complication. Fortunately there is no signs of active infection at this time. No fevers, chills, nausea, vomiting, or diarrhea. He does tell me that he actually got in touch with [employer] and they feel like they can add diagnosis codes to his orders in order to get an external brace for him as opposed to the one that goes internal to issue. That would be excellent for his foot drop and I did tell him that if there is anything I need to fill out in that regard I will be more than happy to oblige and fill out what is needed for [employer]. 05/14/2019 upon evaluation today patient's original wound site actually appears to be doing much better which is good news. Fortunately there is no signs of active infection at this time. No fevers, chills, nausea, vomiting, or diarrhea. With that being said the patient unfortunately is continuing to have issues with rubbing he tells me that the felt slid on his foot although he did not realize it until today. Nonetheless I think this did cause a blister around the edge of  the wound and he does an open wound that is slightly larger overall in measurement compared to last time mainly due to this blister. Other than that I feel like he is doing quite well. 05/21/2019 upon evaluation today patient appears to be doing excellent in regard to his wound at this time on the plantar foot. He is showing signs of improvement he does have some slough and biofilm noted on the surface of the wound  this is going require some sharp debridement today as well. Fortunately there is no evidence of active infection. No fevers, chills, nausea, vomiting, or diarrhea. 05/28/2019 on evaluation today patient's wound actually appears to be measuring smaller upon initial inspection and I do believe that it is doing well. With that being said he continues to have a blister on the outside/lateral edge of the wound that occurs as a result of rubbing due to his dropfoot currently. Subsequently we have filled out the paperwork for his new external brace but again that does take several weeks to get that completed once everything is returned to back we did send this back last week as far as the paperwork was concerned. With that being said there does not appear to be any signs of active infection at this time. NIMESH, RIOLO ([id number]) 06/04/2019 on evaluation today patient's wound actually appears to be doing slightly better compared to last week which is good news. He still states that his insurance unfortunately does not seem to want to cover any of his new brace. He is going to just pay for this himself. Nonetheless he is going by there today when he leaves here that is [employer] in [place] that is going to be making this brace for him. 06/18/2019 upon evaluation today patient fortunately does have his new brace which is the external AFO brace with bilateral uprights. Subsequently he seems to be doing much better in fact the wound already appears better. He has not even been using the felt he is just  using a dressing over top of the wound and overall I am very pleased with how things seem to be progressing. There is no signs of active infection at this time which is great news and I think that the AFO is good to help him tremendously as far as trying to keep pressure off of the 06/25/2019 upon evaluation today patient seems to be making good progress in regard to his wound. He has been tolerating the dressing changes without complication. Fortunately there is no signs of active infection at this time. No fevers, chills, nausea, vomiting, or diarrhea. 07/02/2019 upon evaluation today patient unfortunately had a blistered area on the bottom of his foot tracking more towards the midline of the plantar aspect. He does not seem to have issues with rubbing more laterally that he used to have but nonetheless this is still giving him some trouble and causing problems here. Fortunately there is no signs of active infection at this time. No fevers, chills, nausea, vomiting, or diarrhea. 07/09/2019 upon evaluation today patient appears to be doing well with regard to his plantar foot ulcer. Definitely this is much improved compared to last week's visit which again last week the issue there was that he had an area of callus buildup on the plantar aspect of his foot I was able to clean this away and after effectively doing so the wound seems to be doing much better which is great news. There is no signs of active infection at this time. 07/16/2019 upon evaluation today patient appears to be making some progress in my opinion with regard to his plantar foot ulcer. He has been tolerating the dressing changes without complication. Fortunately there is no evidence of active infection at this time which is great news. 07/23/2019 upon evaluation today patient appears to be doing well with regard to his wounds on the plantar foot. He has been tolerating the dressing changes without complication. These do seem to be  healing. 08/06/2019 upon evaluation today patient's wound actually appears to be showing some signs of improvement though he still has a significant amount of callus buildup over this location. That is can require some debridement today. With that being said the actual wound openings seem to be dramatically improved which is great news. There is no signs of active infection at this time. No fevers, chills, nausea, vomiting, or diarrhea. 08/13/2019 upon evaluation today patient appears to be doing well with regard to his foot ulcer. He has been tolerating the dressing changes without complication. Fortunately there is no signs of active infection at this time. No fevers, chills, nausea, vomiting, or diarrhea. 08/20/2019 upon evaluation today patient appears to be doing excellent in regard to his foot ulcer. I do believe he still showing signs of improvement this is very slow but again I think still pressure getting to the area even with his brace and custom shoe is still part of the main issue with the delay here. Nonetheless I do not see any signs of worsening or infection all of which is good news. Objective Constitutional Well-nourished and well-hydrated in no acute distress. Vitals Time Taken: 11:11 AM, Height: 71 in, Weight: 205 lbs, BMI: 28.6, Temperature: 97.6 F, Pulse: 81 bpm, Respiratory Rate: 16 breaths/min, Blood Pressure: 185/75 mmHg. Respiratory normal breathing without difficulty. Psychiatric this patient is able to make decisions and demonstrates good insight into disease process. Alert and Oriented x 3. pleasant and cooperative. General Notes: Upon inspection patient's wound bed actually showed signs of some callus buildup this did require sharp debridement to clear away today which I did without complication really there was only minimal bleeding noted post debridement the wound bed appears to be doing much better. Integumentary (Hair, Skin) Wound #5 status is Open. Original cause  of wound was Pressure Injury. The wound is located on the Left,Lateral Foot. The wound measures 0.5cm length x 0.2cm width x 0.2cm depth; 0.079cm^2 area and 0.016cm^3 volume. There is Fat Layer (Subcutaneous Tissue) Exposed exposed. There is no tunneling or undermining noted. There is a medium amount of serosanguineous drainage noted. The wound margin is flat and intact. There is large (67-100%) pink granulation within the wound bed. There is no necrotic tissue within the wound bed. 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 There was a Excisional Skin/Subcutaneous Tissue Debridement with a total area of 0.1 sq cm performed by [name], Yordan Martindale E., [name]. With the following instrument(s): Curette to remove Viable and Non-Viable tissue/material. Material removed includes Callus and Subcutaneous Tissue and after achieving pain control using Lidocaine. A time out was conducted at 11:21, prior to the start of the procedure. A Minimum amount of bleeding was controlled with Pressure. The procedure was tolerated well. Post Debridement Measurements: 0.5cm length x 0.2cm width x 0.2cm depth; 0.016cm^3 volume. Character of Wound/Ulcer Post Debridement is stable. Severity of Tissue Post Debridement is: Fat layer exposed. Post procedure Diagnosis Wound #5: Same as Pre-Procedure Plan Wound Cleansing: Wound #5 Left,Lateral Foot: May shower with protection. Anesthetic (add to Medication List): Wound #5 Left,Lateral Foot: Topical Lidocaine 4% cream applied to wound bed prior to debridement (In  Clinic  Only). Primary Wound Dressing: Wound #5 Left,Lateral Foot: Silver Collagen Secondary Dressing: Wound #5 Left,Lateral Foot: Boardered Foam Dressing Dressing Change Frequency: Wound #5 Left,Lateral Foot: Change dressing every other day. Follow-up Appointments: Wound #5 Left,Lateral Foot: Return Appointment in 1 week. Edema Control: Wound #5 Left,Lateral Foot: Elevate legs to the level of the heart and pump ankles as often as possible Additional Orders / Instructions: Wound #5 Left,Lateral Foot: Other: - Patient to follow-up with [employer] for adjustments to braces and shoes. Requested paperwork sent to [employer]. 1. My suggestion currently is can be that we go ahead and continue to monitor for any signs of worsening and can switch to a collagen dressing at this point hopefully this will help with greater epithelization. 2. I am also going to suggest currently that we continue with the bordered foam dressing. 3. Patient should also continue to wear his brace for foot drop I think this has helped this is still very slow to simply due to the fact that were not able to more effectively offload him. We will see patient back for reevaluation in 1 week here in the clinic. If anything worsens or changes patient will contact our office for additional recommendations. Electronic Signature(s) Signed: 08/20/2019 5:29:21 PM By: Worthy Keeler [name] Entered By: Worthy Keeler on 08/20/2019 17:29:21 [name] ([id number]) -------------------------------------------------------------------------------- SuperBill Details Patient Name: [name] Date of Service: 08/20/2019 Medical Record Number: [id number] Patient Account Number: [id number] Date of Birth/Sex: 1945-05-29 (74 y.o. 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 M21.372 Foot drop, left foot Facility Procedures CPT4 Code: 15183437 Description: 35789 - DEB SUBQ TISSUE 20 SQ CM/< Modifier: Quantity: 1 CPT4 Code: Description: ICD-10 Diagnosis Description 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

## 2019-08-20 NOTE — Progress Notes (Signed)
REYNOLDS, KITTEL ([id number]) Visit Report for 08/20/2019 Arrival Information Details Patient Name: [name], [name] Date of Service: 08/20/2019 11:00 AM Medical Record Number: [id number] Patient Account Number: [id number] Date of Birth/Sex: [birth year] (73 y.o. M) Treating RN: Montey Hora Primary Care Dylan Ruotolo: [name] Other Clinician: Referring Burke Terry: [name] Treating Micki Cassel/Extender: Melburn Hake, HOYT Weeks in Treatment: 27 Visit Information History Since Last Visit Added or deleted any medications: No Patient Arrived: Cane Any new allergies or adverse reactions: No Arrival Time: 11:10 Had a fall or experienced change in No Accompanied By: self activities of daily living that may affect Transfer Assistance: None risk of falls: Patient Identification Verified: Yes Signs or symptoms of abuse/neglect since last visito No Secondary Verification Process Completed: Yes Hospitalized since last visit: No Patient Has Alerts: Yes Implantable device outside of the clinic excluding No Patient Alerts: DMII cellular tissue based products placed in the center since last visit: Has Dressing in Place as Prescribed: Yes Has Footwear/Offloading in Place as Prescribed: Yes Left: Other:brace Pain Present Now: No Electronic Signature(s) Signed: 08/20/2019 4:26:48 PM By: Montey Hora Entered By: Montey Hora on 08/20/2019 11:11:28 [name] ([id number]) -------------------------------------------------------------------------------- Encounter Discharge Information Details Patient Name: [name] Date of Service: 08/20/2019 11:00 AM Medical Record Number: [id number] Patient Account Number: [id number] Date of Birth/Sex: 11-30-1945 (73 y.o. M) Treating RN: Army Melia Primary Care Wadell Craddock: [name] Other Clinician: Referring Hayes Czaja: [name] Treating Izzie Geers/Extender: Melburn Hake, HOYT Weeks in Treatment: 29 Encounter Discharge  Information Items Post Procedure Vitals Discharge Condition: Stable Temperature (F): 97.6 Ambulatory Status: Ambulatory Pulse (bpm): 81 Discharge Destination: Home Respiratory Rate (breaths/min): 16 Transportation: Private Auto Blood Pressure (mmHg): 185/75 Accompanied By: self Schedule Follow-up Appointment: Yes Clinical Summary of Care: Electronic Signature(s) Signed: 08/20/2019 4:16:16 PM By: Army Melia Entered By: Army Melia on 08/20/2019 11:26:15 [name] ([id number]) -------------------------------------------------------------------------------- Lower Extremity Assessment Details Patient Name: [name] Date of Service: 08/20/2019 11:00 AM Medical Record Number: [id number] Patient Account Number: [id number] Date of Birth/Sex: 10-17-1945 (73 y.o. M) Treating RN: Montey Hora Primary Care Giamarie Bueche: [name] Other Clinician: Referring Analynn Daum: [name] Treating Zoanne Newill/Extender: [name], HOYT Weeks in Treatment: 27 Edema Assessment Assessed: [Left: No] [Right: No] Edema: [Left: N] [Right: o] Vascular Assessment Pulses: Dorsalis Pedis Palpable: [Left:Yes] Electronic Signature(s) Signed: 08/20/2019 4:26:48 PM By: Montey Hora Entered By: Montey Hora on 08/20/2019 11:11:49 [name] ([id number]) -------------------------------------------------------------------------------- Multi Wound Chart Details Patient Name: [name] Date of Service: 08/20/2019 11:00 AM Medical Record Number: [id number] Patient Account Number: [id number] Date of Birth/Sex: 1945-06-21 (73 y.o. M) Treating RN: Army Melia Primary Care Shamarie Call: [name] Other Clinician: Referring Geonna Lockyer: [name] Treating Ralphine Hinks/Extender: Melburn Hake, HOYT Weeks in Treatment: 27 Vital Signs Height(in): 71 Pulse(bpm): 81 Weight(lbs): 205 Blood Pressure(mmHg): 185/75 Body Mass Index(BMI): 29 Temperature(F): 97.6 Respiratory  Rate(breaths/min): 16 Photos: [N/A:N/A] Wound Location: Left, Lateral Foot N/A N/A Wounding Event: Pressure Injury N/A N/A Primary Etiology: Diabetic Wound/Ulcer of the Lower N/A N/A Extremity Comorbid History: Cataracts, Middle ear problems, N/A N/A Sleep Apnea, Hypertension, Type II Diabetes, Gout, Osteoarthritis, Neuropathy Date Acquired: 01/19/2019 N/A N/A Weeks of Treatment: 27 N/A N/A Wound Status: Open N/A N/A Measurements L x W x D (cm) 0.5x0.2x0.2 N/A N/A Area (cm) : 0.079 N/A N/A Volume (cm) : 0.016 N/A N/A % Reduction in Area: 71.30% N/A N/A % Reduction in Volume: 80.50% N/A N/A Classification: Grade 2 N/A N/A Exudate Amount: Medium N/A N/A Exudate Type: Serosanguineous N/A N/A Exudate Color:  red, brown N/A N/A Wound Margin: Flat and Intact N/A N/A Granulation Amount: Large (67-100%) N/A N/A Granulation Quality: Pink N/A N/A Necrotic Amount: None Present (0%) N/A N/A Exposed Structures: Fat Layer (Subcutaneous Tissue) N/A N/A Exposed: Yes Fascia: No Tendon: No Muscle: No Joint: No Bone: No Epithelialization: Small (1-33%) N/A N/A Treatment Notes Electronic Signature(s) Signed: 08/20/2019 4:16:16 PM By: Army Melia Entered By: Army Melia on 08/20/2019 11:22:12 [name] ([id number]) -------------------------------------------------------------------------------- Multi-Disciplinary Care Plan Details Patient Name: [name] Date of Service: 08/20/2019 11:00 AM Medical Record Number: [id number] Patient Account Number: [id number] Date of Birth/Sex: 1945-04-19 (73 y.o. M) Treating RN: Army Melia Primary Care Caress Reffitt: [name] Other Clinician: Referring Yessika Otte: [name] Treating Leeum Sankey/Extender: Melburn Hake, HOYT Weeks in Treatment: 27 Active Inactive Pressure Nursing Diagnoses: Knowledge deficit related to management of pressures ulcers Goals: Patient will remain free from development of additional pressure ulcers Date  Initiated: 04/30/2019 Target Resolution Date: 05/14/2019 Goal Status: Active Patient/caregiver will verbalize understanding of pressure ulcer management Date Initiated: 04/30/2019 Target Resolution Date: 05/14/2019 Goal Status: Active Interventions: Assess: immobility, friction, shearing, incontinence upon admission and as needed Treatment Activities: Patient referred for pressure reduction/relief devices : 04/30/2019 Notes: Wound/Skin Impairment Nursing Diagnoses: Impaired tissue integrity Goals: Ulcer/skin breakdown will have a volume reduction of 30% by week 4 Date Initiated: 04/30/2019 Target Resolution Date: 05/31/2019 Goal Status: Active Interventions: Assess patient/caregiver ability to obtain necessary supplies Assess ulceration(s) every visit Treatment Activities: Referred to DME Zyhir Cappella for dressing supplies : 04/30/2019 Skin care regimen initiated : 04/30/2019 Topical wound management initiated : 04/30/2019 Notes: Electronic Signature(s) Signed: 08/20/2019 4:16:16 PM By: Army Melia Entered By: Army Melia on 08/20/2019 11:22:02 [name] ([id number]) -------------------------------------------------------------------------------- Pain Assessment Details Patient Name: [name] Date of Service: 08/20/2019 11:00 AM Medical Record Number: [id number] Patient Account Number: [id number] Date of Birth/Sex: 1945/07/08 (73 y.o. M) Treating RN: Montey Hora Primary Care Zachry Hopfensperger: [name] Other Clinician: Referring Eldean Nanna: [name] Treating Masiah Woody/Extender: Melburn Hake, HOYT Weeks in Treatment: 27 Active Problems Location of Pain Severity and Description of Pain Patient Has Paino No Site Locations Pain Management and Medication Current Pain Management: Electronic Signature(s) Signed: 08/20/2019 4:26:48 PM By: Montey Hora Entered By: Montey Hora on 08/20/2019 11:11:39 [name]  ([id number]) -------------------------------------------------------------------------------- Patient/Caregiver Education Details Patient Name: [name] Date of Service: 08/20/2019 11:00 AM Medical Record Number: [id number] Patient Account Number: [id number] Date of Birth/Gender: 1945/09/24 (73 y.o. M) Treating RN: Army Melia Primary Care Physician: [name] Other Clinician: Referring Physician: [name] Treating Physician/Extender: Sharalyn Ink in Treatment: 43 Education Assessment Education Provided To: Patient Education Topics Provided Wound/Skin Impairment: Handouts: Caring for Your Ulcer Methods: Demonstration, Explain/Verbal Responses: State content correctly Electronic Signature(s) Signed: 08/20/2019 4:16:16 PM By: Army Melia Entered By: Army Melia on 08/20/2019 11:25:28 [name] ([id number]) -------------------------------------------------------------------------------- Wound Assessment Details Patient Name: [name] Date of Service: 08/20/2019 11:00 AM Medical Record Number: [id number] Patient Account Number: [id number] Date of Birth/Sex: 11/29/1945 (73 y.o. M) Treating RN: Montey Hora Primary Care Krizia Flight: [name] Other Clinician: Referring Deondray Ospina: [name] Treating Jamieon Lannen/Extender: [name], HOYT Weeks in Treatment: 27 Wound Status Wound Number: 5 Primary Diabetic Wound/Ulcer of the Lower Extremity Etiology: Wound Location: Left, Lateral Foot Wound Open Wounding Event: Pressure Injury Status: Date Acquired: 01/19/2019 Comorbid Cataracts, Middle ear problems, Sleep Apnea, Weeks Of Treatment: 27 History: Hypertension, Type II Diabetes, Gout, Osteoarthritis, Clustered Wound: No Neuropathy Photos Wound Measurements Length: (cm) 0.5 % Redu Width: (  cm) 0.2 % Redu Depth: (cm) 0.2 Epithe Area: (cm) 0.079 Tunne Volume: (cm) 0.016 Under ction in Area: 71.3% ction in Volume:  80.5% lialization: Small (1-33%) ling: No mining: No Wound Description Classification: Grade 2 Foul Wound Margin: Flat and Intact Sloug Exudate Amount: Medium Exudate Type: Serosanguineous Exudate Color: red, brown Odor After Cleansing: No h/Fibrino No Wound Bed Granulation Amount: Large (67-100%) Exposed Structure Granulation Quality: Pink Fascia Exposed: No Necrotic Amount: None Present (0%) Fat Layer (Subcutaneous Tissue) Exposed: Yes Tendon Exposed: No Muscle Exposed: No Joint Exposed: No Bone Exposed: No Treatment Notes Wound #5 (Left, Lateral Foot) Notes prisma, telfa Electronic Signature(s) AALIJAH, LANPHERE ([id number]) Signed: 08/20/2019 4:26:48 PM By: Montey Hora Entered By: Montey Hora on 08/20/2019 11:15:31 [name] ([id number]) -------------------------------------------------------------------------------- Vitals Details Patient Name: [name] Date of Service: 08/20/2019 11:00 AM Medical Record Number: [id number] Patient Account Number: [id number] Date of Birth/Sex: 12-01-1945 (73 y.o. M) Treating RN: Montey Hora Primary Care Janiel Crisostomo: [name] Other Clinician: Referring Trayden Brandy: [name] Treating Paxon Propes/Extender: [name], HOYT Weeks in Treatment: 27 Vital Signs Time Taken: 11:11 Temperature (F): 97.6 Height (in): 71 Pulse (bpm): 81 Weight (lbs): 205 Respiratory Rate (breaths/min): 16 Body Mass Index (BMI): 28.6 Blood Pressure (mmHg): 185/75 Reference Range: 80 - 120 mg / dl Electronic Signature(s) Signed: 08/20/2019 4:26:48 PM By: Montey Hora Entered By: Montey Hora on 08/20/2019 11:12:54

## 2019-08-24 ENCOUNTER — Other Ambulatory Visit: Payer: Self-pay | Admitting: Family Medicine

## 2019-08-27 ENCOUNTER — Ambulatory Visit: Payer: Medicare Other | Admitting: Physician Assistant

## 2019-08-29 ENCOUNTER — Ambulatory Visit (INDEPENDENT_AMBULATORY_CARE_PROVIDER_SITE_OTHER): Payer: Medicare Other

## 2019-08-29 ENCOUNTER — Other Ambulatory Visit: Payer: Self-pay

## 2019-08-29 DIAGNOSIS — E291 Testicular hypofunction: Secondary | ICD-10-CM

## 2019-08-29 MED ORDER — TESTOSTERONE CYPIONATE 200 MG/ML IM SOLN
150.0000 mg | INTRAMUSCULAR | Status: DC
  Administered 2019-08-29: 150 mg via INTRAMUSCULAR

## 2019-08-29 NOTE — Progress Notes (Signed)
Per orders of Dr. Lorelei Pont, injection of testosterone given by Randall An. Patient tolerated injection well.

## 2019-08-30 ENCOUNTER — Encounter: Payer: Medicare Other | Admitting: Internal Medicine

## 2019-08-30 DIAGNOSIS — M21372 Foot drop, left foot: Secondary | ICD-10-CM | POA: Diagnosis not present

## 2019-08-30 DIAGNOSIS — L97522 Non-pressure chronic ulcer of other part of left foot with fat layer exposed: Secondary | ICD-10-CM | POA: Diagnosis not present

## 2019-08-30 DIAGNOSIS — E11621 Type 2 diabetes mellitus with foot ulcer: Secondary | ICD-10-CM | POA: Diagnosis not present

## 2019-08-30 DIAGNOSIS — L84 Corns and callosities: Secondary | ICD-10-CM | POA: Diagnosis not present

## 2019-08-30 DIAGNOSIS — E1142 Type 2 diabetes mellitus with diabetic polyneuropathy: Secondary | ICD-10-CM | POA: Diagnosis not present

## 2019-08-31 NOTE — Progress Notes (Signed)
[name], [name] ([id number]) Visit Report for 08/30/2019 Arrival Information Details Patient Name: MITCHEL, DELDUCA Date of Service: 08/30/2019 12:30 PM Medical Record Number: [id number] Patient Account Number: [id number] Date of Birth/Sex: Jun 22, 1945 (74 y.o. M) Treating RN: Montey Hora Primary Care Lyle Leisner: [name] Other Clinician: Referring Osiris Charles: [name] Treating [name]/Extender: Tito Dine in Treatment: 28 Visit Information History Since Last Visit Added or deleted any medications: No Patient Arrived: Ambulatory Any new allergies or adverse reactions: No Arrival Time: 12:45 Had a fall or experienced change in No Accompanied By: self activities of daily living that may affect Transfer Assistance: None risk of falls: Patient Identification Verified: Yes Signs or symptoms of abuse/neglect since last visito No Secondary Verification Process Completed: Yes Hospitalized since last visit: No Patient Has Alerts: Yes Implantable device outside of the clinic excluding No Patient Alerts: DMII cellular tissue based products placed in the center since last visit: Has Dressing in Place as Prescribed: Yes Pain Present Now: No Electronic Signature(s) Signed: 08/30/2019 5:37:23 PM By: Montey Hora Entered By: Montey Hora on 08/30/2019 12:46:05 [name] ([id number]) -------------------------------------------------------------------------------- Clinic Level of Care Assessment Details Patient Name: [name] Date of Service: 08/30/2019 12:30 PM Medical Record Number: [id number] Patient Account Number: [id number] Date of Birth/Sex: [birth year] (74 y.o. M) Treating RN: Army Melia Primary Care Ryhanna Dunsmore: [name] Other Clinician: Referring Catherene Kaleta: [name] Treating Verlon Pischke/Extender: Tito Dine in Treatment: 28 Clinic Level of Care Assessment Items TOOL 4 Quantity Score []  - Use when only an EandM  is performed on FOLLOW-UP visit 0 ASSESSMENTS - Nursing Assessment / Reassessment X - Reassessment of Co-morbidities (includes updates in patient status) 1 10 X- 1 5 Reassessment of Adherence to Treatment Plan ASSESSMENTS - Wound and Skin Assessment / Reassessment X - Simple Wound Assessment / Reassessment - one wound 1 5 []  - 0 Complex Wound Assessment / Reassessment - multiple wounds []  - 0 Dermatologic / Skin Assessment (not related to wound area) ASSESSMENTS - Focused Assessment []  - Circumferential Edema Measurements - multi extremities 0 []  - 0 Nutritional Assessment / Counseling / Intervention []  - 0 Lower Extremity Assessment (monofilament, tuning fork, pulses) []  - 0 Peripheral Arterial Disease Assessment (using hand held doppler) ASSESSMENTS - Ostomy and/or Continence Assessment and Care []  - Incontinence Assessment and Management 0 []  - 0 Ostomy Care Assessment and Management (repouching, etc.) PROCESS - Coordination of Care X - Simple Patient / Family Education for ongoing care 1 15 []  - 0 Complex (extensive) Patient / Family Education for ongoing care []  - 0 Staff obtains Programmer, systems, Records, Test Results / Process Orders []  - 0 Staff telephones HHA, Nursing Homes / Clarify orders / etc []  - 0 Routine Transfer to another Facility (non-emergent condition) []  - 0 Routine Hospital Admission (non-emergent condition) []  - 0 New Admissions / Biomedical engineer / Ordering NPWT, Apligraf, etc. []  - 0 Emergency Hospital Admission (emergent condition) X- 1 10 Simple Discharge Coordination []  - 0 Complex (extensive) Discharge Coordination PROCESS - Special Needs []  - Pediatric / Minor Patient Management 0 []  - 0 Isolation Patient Management []  - 0 Hearing / Language / Visual special needs []  - 0 Assessment of Community assistance (transportation, D/C planning, etc.) []  - 0 Additional assistance / Altered mentation []  - 0 Support Surface(s) Assessment (bed,  cushion, seat, etc.) INTERVENTIONS - Wound Cleansing / Measurement Bircher, Nicolaas E. ([id number]) X- 1 5 Simple Wound Cleansing - one wound []  - 0 Complex Wound Cleansing - multiple  wounds X- 1 5 Wound Imaging (photographs - any number of wounds) []  - 0 Wound Tracing (instead of photographs) X- 1 5 Simple Wound Measurement - one wound []  - 0 Complex Wound Measurement - multiple wounds INTERVENTIONS - Wound Dressings []  - Small Wound Dressing one or multiple wounds 0 X- 1 15 Medium Wound Dressing one or multiple wounds []  - 0 Large Wound Dressing one or multiple wounds []  - 0 Application of Medications - topical []  - 0 Application of Medications - injection INTERVENTIONS - Miscellaneous []  - External ear exam 0 []  - 0 Specimen Collection (cultures, biopsies, blood, body fluids, etc.) []  - 0 Specimen(s) / Culture(s) sent or taken to Lab for analysis []  - 0 Patient Transfer (multiple staff / Civil Service fast streamer / Similar devices) []  - 0 Simple Staple / Suture removal (25 or less) []  - 0 Complex Staple / Suture removal (26 or more) []  - 0 Hypo / Hyperglycemic Management (close monitor of Blood Glucose) []  - 0 Ankle / Brachial Index (ABI) - do not check if billed separately X- 1 5 Vital Signs Has the patient been seen at the hospital within the last three years: Yes Total Score: 80 Level Of Care: New/Established - Level 3 Electronic Signature(s) Signed: 08/30/2019 5:18:09 PM By: Army Melia Entered By: Army Melia on 08/30/2019 13:05:09 [name] ([id number]) -------------------------------------------------------------------------------- Encounter Discharge Information Details Patient Name: [name] Date of Service: 08/30/2019 12:30 PM Medical Record Number: [id number] Patient Account Number: [id number] Date of Birth/Sex: [birth year] (74 y.o. M) Treating RN: Army Melia Primary Care Modesta Sammons: [name] Other Clinician: Referring Whitney Bingaman: [name] Treating Jezebel Pollet/Extender: Tito Dine in Treatment: 28 Encounter Discharge Information Items Discharge Condition: Stable Ambulatory Status: Cane Discharge Destination: Home Transportation: Private Auto Accompanied By: self Schedule Follow-up Appointment: Yes Clinical Summary of Care: Electronic Signature(s) Signed: 08/30/2019 5:18:09 PM By: Army Melia Entered By: Army Melia on 08/30/2019 13:06:39 [name] ([id number]) -------------------------------------------------------------------------------- Lower Extremity Assessment Details Patient Name: [name] Date of Service: 08/30/2019 12:30 PM Medical Record Number: [id number] Patient Account Number: [id number] Date of Birth/Sex: May 08, 1945 (73 y.o. M) Treating RN: Montey Hora Primary Care Fathima Bartl: [name] Other Clinician: Referring Lachae Hohler: [name] Treating Emerie Vanderkolk/Extender: Ricard Dillon Weeks in Treatment: 28 Edema Assessment Assessed: [Left: No] [Right: No] Edema: [Left: N] [Right: o] Vascular Assessment Pulses: Dorsalis Pedis Palpable: [Left:Yes] Electronic Signature(s) Signed: 08/30/2019 5:37:23 PM By: Montey Hora Entered By: Montey Hora on 08/30/2019 12:50:17 [name] ([id number]) -------------------------------------------------------------------------------- Multi Wound Chart Details Patient Name: [name] Date of Service: 08/30/2019 12:30 PM Medical Record Number: [id number] Patient Account Number: [id number] Date of Birth/Sex: [birth year] (73 y.o. M) Treating RN: Army Melia Primary Care Zaire Vanbuskirk: [name] Other Clinician: Referring Deshondra Worst: [name] Treating Tippi Mccrae/Extender: Tito Dine in Treatment: 28 Vital Signs Height(in): 71 Pulse(bpm): 120 Weight(lbs): 205 Blood Pressure(mmHg): 130/65 Body Mass Index(BMI): 29 Temperature(F): 98.0 Respiratory Rate(breaths/min): 18 Photos:  [N/A:N/A] Wound Location: Left, Lateral Foot N/A N/A Wounding Event: Pressure Injury N/A N/A Primary Etiology: Diabetic Wound/Ulcer of the Lower N/A N/A Extremity Comorbid History: Cataracts, Middle ear problems, N/A N/A Sleep Apnea, Hypertension, Type II Diabetes, Gout, Osteoarthritis, Neuropathy Date Acquired: 01/19/2019 N/A N/A Weeks of Treatment: 28 N/A N/A Wound Status: Open N/A N/A Measurements L x W x D (cm) 0.5x0.3x0.3 N/A N/A Area (cm) : 0.118 N/A N/A Volume (cm) : 0.035 N/A N/A % Reduction in Area: 57.10% N/A N/A % Reduction in Volume: 57.30% N/A N/A Classification:  Grade 2 N/A N/A Exudate Amount: Medium N/A N/A Exudate Type: Serosanguineous N/A N/A Exudate Color: red, brown N/A N/A Wound Margin: Flat and Intact N/A N/A Granulation Amount: Large (67-100%) N/A N/A Granulation Quality: Pink N/A N/A Necrotic Amount: None Present (0%) N/A N/A Exposed Structures: Fat Layer (Subcutaneous Tissue) N/A N/A Exposed: Yes Fascia: No Tendon: No Muscle: No Joint: No Bone: No Epithelialization: Small (1-33%) N/A N/A Treatment Notes Wound #5 (Left, Lateral Foot) Notes prisma, felt pad, gauze conform [name], [name] ([id number]) Electronic Signature(s) Signed: 08/30/2019 5:46:06 PM By: Linton Ham MD Entered By: Linton Ham on 08/30/2019 13:07:46 [name] ([id number]) -------------------------------------------------------------------------------- Multi-Disciplinary Care Plan Details Patient Name: [name] Date of Service: 08/30/2019 12:30 PM Medical Record Number: [id number] Patient Account Number: [id number] Date of Birth/Sex: 1945/06/29 (73 y.o. M) Treating RN: Army Melia Primary Care Trine Fread: [name] Other Clinician: Referring Noble Bodie: [name] Treating Kyah Buesing/Extender: Tito Dine in Treatment: 28 Active Inactive Pressure Nursing Diagnoses: Knowledge deficit related to management of pressures  ulcers Goals: Patient will remain free from development of additional pressure ulcers Date Initiated: 04/30/2019 Target Resolution Date: 05/14/2019 Goal Status: Active Patient/caregiver will verbalize understanding of pressure ulcer management Date Initiated: 04/30/2019 Target Resolution Date: 05/14/2019 Goal Status: Active Interventions: Assess: immobility, friction, shearing, incontinence upon admission and as needed Treatment Activities: Patient referred for pressure reduction/relief devices : 04/30/2019 Notes: Wound/Skin Impairment Nursing Diagnoses: Impaired tissue integrity Goals: Ulcer/skin breakdown will have a volume reduction of 30% by week 4 Date Initiated: 04/30/2019 Target Resolution Date: 05/31/2019 Goal Status: Active Interventions: Assess patient/caregiver ability to obtain necessary supplies Assess ulceration(s) every visit Treatment Activities: Referred to DME Robyn Galati for dressing supplies : 04/30/2019 Skin care regimen initiated : 04/30/2019 Topical wound management initiated : 04/30/2019 Notes: Electronic Signature(s) Signed: 08/30/2019 5:18:09 PM By: Army Melia Entered By: Army Melia on 08/30/2019 13:03:25 [name] ([id number]) -------------------------------------------------------------------------------- Pain Assessment Details Patient Name: [name] Date of Service: 08/30/2019 12:30 PM Medical Record Number: [id number] Patient Account Number: [id number] Date of Birth/Sex: March 13, 1946 (73 y.o. M) Treating RN: Montey Hora Primary Care Julio Zappia: [name] Other Clinician: Referring Merritt Mccravy: [name] Treating Arbor Leer/Extender: Tito Dine in Treatment: 28 Active Problems Location of Pain Severity and Description of Pain Patient Has Paino No Site Locations Pain Management and Medication Current Pain Management: Electronic Signature(s) Signed: 08/30/2019 5:37:23 PM By: Montey Hora Entered By: Montey Hora on 08/30/2019 12:46:13 [name] ([id number]) -------------------------------------------------------------------------------- Patient/Caregiver Education Details Patient Name: [name] Date of Service: 08/30/2019 12:30 PM Medical Record Number: [id number] Patient Account Number: [id number] Date of Birth/Gender: January 23, 1946 (73 y.o. M) Treating RN: Army Melia Primary Care Physician: [name] Other Clinician: Referring Physician: [name] Treating Physician/Extender: Tito Dine in Treatment: 28 Education Assessment Education Provided To: Patient Education Topics Provided Wound/Skin Impairment: Handouts: Caring for Your Ulcer Methods: Demonstration, Explain/Verbal Responses: State content correctly Electronic Signature(s) Signed: 08/30/2019 5:18:09 PM By: Army Melia Entered By: Army Melia on 08/30/2019 13:05:57 [name] ([id number]) -------------------------------------------------------------------------------- Wound Assessment Details Patient Name: [name] Date of Service: 08/30/2019 12:30 PM Medical Record Number: [id number] Patient Account Number: [id number] Date of Birth/Sex: [birth year] (73 y.o. M) Treating RN: Montey Hora Primary Care Elida Harbin: [name] Other Clinician: Referring Analeigh Aries: [name] Treating Malori Myers/Extender: Tito Dine in Treatment: 28 Wound Status Wound Number: 5 Primary Diabetic Wound/Ulcer of the Lower Extremity Etiology: Wound Location: Left, Lateral Foot Wound Open Wounding Event: Pressure Injury Status: Date Acquired:  01/19/2019 Comorbid Cataracts, Middle ear problems, Sleep Apnea, Weeks Of Treatment: 28 History: Hypertension, Type II Diabetes, Gout, Osteoarthritis, Clustered Wound: No Neuropathy Photos Wound Measurements Length: (cm) 0.5 Width: (cm) 0.3 Depth: (cm) 0.3 Area: (cm) 0.118 Volume: (cm) 0.035 % Reduction in Area: 57.1% %  Reduction in Volume: 57.3% Epithelialization: Small (1-33%) Tunneling: No Undermining: No Wound Description Classification: Grade 2 Foul Wound Margin: Flat and Intact Sloug Exudate Amount: Medium Exudate Type: Serosanguineous Exudate Color: red, brown Odor After Cleansing: No h/Fibrino No Wound Bed Granulation Amount: Large (67-100%) Exposed Structure Granulation Quality: Pink Fascia Exposed: No Necrotic Amount: None Present (0%) Fat Layer (Subcutaneous Tissue) Exposed: Yes Tendon Exposed: No Muscle Exposed: No Joint Exposed: No Bone Exposed: No Treatment Notes Wound #5 (Left, Lateral Foot) Notes prisma, felt pad, gauze conform Electronic Signature(s) [name], [name] ([id number]) Signed: 08/30/2019 12:52:48 PM By: Montey Hora Entered By: Montey Hora on 08/30/2019 12:52:47 [name] ([id number]) -------------------------------------------------------------------------------- [place] Details Patient Name: [name] Date of Service: 08/30/2019 12:30 PM Medical Record Number: [id number] Patient Account Number: [id number] Date of Birth/Sex: 04/10/1946 (73 y.o. M) Treating RN: Montey Hora Primary Care Kaeli Nichelson: [name] Other Clinician: Referring Channel Papandrea: [name] Treating Aviendha Azbell/Extender: Tito Dine in Treatment: 28 Vital Signs Time Taken: 12:50 Temperature (F): 98.0 Height (in): 71 Pulse (bpm): 120 Weight (lbs): 205 Respiratory Rate (breaths/min): 18 Body Mass Index (BMI): 28.6 Blood Pressure (mmHg): 130/65 Reference Range: 80 - 120 mg / dl Electronic Signature(s) Signed: 08/30/2019 5:37:23 PM By: Montey Hora Entered By: Montey Hora on 08/30/2019 12:54:23

## 2019-08-31 NOTE — Progress Notes (Addendum)
[name], [name] ([id number]) Visit Report for 08/30/2019 HPI Details Patient Name: [name], [name] Date of Service: 08/30/2019 12:30 PM Medical Record Number: [id number] Patient Account Number: [id number] Date of Birth/Sex: [birth year] (74 y.o. M) Treating RN: Army Melia Primary Care Provider: [name] Other Clinician: Referring Provider: [name] Treating Provider/Extender: Tito Dine in Treatment: 28 History of Present Illness HPI Description: 10/04/16 on evaluation today patient presents for initial evaluation concerning a laceration of the right wrist and hand on the bowler aspect. He tells me that he fell into glass following a medication change in regard to his diabetes. He initially went to [facility] where she was sutured and subsequently his daughter who is a Marine scientist removed the teachers at the appropriate time. Unfortunately the wound has done poorly and dehissed which has caused him some trouble including infection. He has been on antibiotics both topically and orally though the wound continues to show signs of necrotic tissue according to notes which is what he was sent to [place] for evaluation concerning. He does have diabetes and unfortunately this is severely uncontrolled. He does see an endocrinologist and is working on this. 10/11/2016 -- the patient's notes from the ER were reviewed and he had his suturing done on 09/17/2016 and had necrotic skin flaps which had to be trimmed the last visit he was here. He has managed to get his Santyl ointment and has been using this appropriately. 10/21/16 patient's wound appears to be doing very well on evaluation today. He still has some Slough covering the wound bed but this is nowhere near as severe as when i saw this initially two weeks ago nor even my review of his pictures last week. I'm pleased with how this is progressing. Patient's wound over the right form appears to be doing fairly well on evaluation  today. There is no evidence of infection and he has a small area at the crease of where she is wrist extends and flexes that is still open and appears to be having difficulty due to the fact that it is cracking open. I think this is something that may be controlled with moisture control that is keeping the wound a little bit more moist. Nonetheless this is a tiny region compared to the initial wound and overall appears to be doing very well. No fevers, chills, nausea, or vomiting noted at this time. 11/25/16 on evaluation today patient appears to be doing well in regard to his right wrist ulcer. In fact this appears to be completely healed he is having no discomfort. He is pleased with how this is progressed. Readmission: 06/24/17 on evaluation today patient appears back in our clinic regarding issues that he has been having with his right ankle on the lateral malleolus as well is in the dorsal surface of his left foot. Both have been going on for several weeks unfortunately. He tells me that initially this was being managed by Dr. Edilia Bo and I did review the notes from Dr. Edilia Bo in epic. Patient was placed on amoxicillin initially and then subsequently Keflex following. He has been using triple antibiotic ointment over-the-counter along with a Band-Aid for the wound currently. He did not have any Santyl remaining. His most recent hemoglobin A1c which was just two days ago was 10.3. Fortunately he seems to have excellent blood flow in the bilateral lower extremities with his left ABI being 0.95 in the right ABI 1.01. Patient does have some pain in regard to each of these ulcerations  but he tells me it's not nearly as severe as the hand wound that I help treat earlier over the summer. This was in 2018. 07/15/17 on evaluation today patient's lower surety ulcer is actually both appear to be doing fairly well at this point. He does have a little bit of skin overlapping the ulcer on the right lateral  ankle this was debrided away today just with saline and gauze without complication patient had no discomfort or pain. Otherwise patient's left foot ulcer does appear to be showing signs of improvement he does seem to have a right great toe. Nikki and noted at this point in time. I think that this does need to be managed at this point. No fevers, chills, nausea, or vomiting noted at this time. 07/22/17 on evaluation today patient's wounds in regard to his lower extremities appear to be doing somewhat better. His right ankle wound in fact appears to likely be completely closed. The left dorsal foot ulcer though still open does appear to be a little bit smaller and does also seem to be making good progress. He has not been having problems with the treatment at this point in the general he does not seem to show as much erythema surrounding the wound bed which is good news. He is not having significant discomfort. 08/12/17 on evaluation today patients wounds of both appeared to be doing better in fact they both appear to be completely healed. Overall I'm very pleased with how things have progressed over the past week even more than what I expected. Obviously this is good news and he is happy about this. He did have a fall when going to his orthopedic doctor recently fortunately he does not appear to have injured anything severely but nonetheless does have a couple of bumps and bruises nothing significant at this time but he needs to be seen and wound care for. He may have broken his left wrist he is following up with orthopedics in that regard. Readmission: 10/24/17 on evaluation today patient presents for reevaluation after having been discharged Aug 12, 2017. His wound was healed at that point although he states that it has been somewhat discolored since that time. He has not experienced the drainage as far as the wound is concerned but the fact that it was still discolored been greater than two months after  the fact made him want to come in for reevaluation. Upon inspection today the patient has no pain although he does have neuropathy. He does have a discolored region of the dorsal surface of the left foot which was the site where the ulcer was that we previously treated. The good news is he does not again have any discomfort although obviously I do believe this may be some scar tissue and just discoloration in that regard. Again he hasn't had any drainage or discharge since I last saw him and at this point I see no evidence of that either this area shows complete epithelialization. In general I do not feel like there's any significant issue at this point in that regard. READMISSION 02/09/2019 [name], [name] ([id number]) This is a 74 year old man with type 2 diabetes poorly controlled with significant peripheral neuropathy. He has been in this clinic before in 2018 with a laceration on his right hand and then again in 2019 with an area over the right lateral malleolus. 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 He states that the wound is been there for the last week or 2. It is been uncomfortable to walk on. The man is an active man having to look after an elderly mother. Past medical history, type 2 diabetes with polyneuropathy with a recent hemoglobin A1c in September 2011 0.3, lumbar disc disease, hypergonadism., Left foot drop and gout. We did not repeat his ABIs in the clinic quoting values from July 2019 of 0.95 and 1.01. He is not felt to have significant PAD 02/19/2019 on evaluation today patient actually appears to be doing better with regard to his foot wound based on what I am seeing currently. He was seen by Dr. Dellia Nims on readmission back into the clinic last week when I was off. The good news is the patient has been tolerating the dressing changes without any complication in fact has not really had much  in the way of drainage. He does have an AFO brace which she believes is actually pushing on this area as well and has contributed to the issue. Fortunately there is no signs of active infection at this point. His ABIs appear to be well. 03/05/2019 on evaluation today patient appears to be doing well in regard to his foot ulcer. He has been tolerating the dressing changes without complication. Fortunately there is no signs of active infection. The wound is measuring somewhat smaller and overall I feel like he is making good progress at this point. 03/12/2019 on evaluation today patient actually appears to be doing quite well with regard to his foot ulcer. The wound is looking better the measurement is not significantly better at this point unfortunately but nonetheless again the overall appearance is improving I think he is making some progress. I still think the big issue here is foot drop and again the patient wants to see if potentially he could get a brace to help with this. Subsequently I think that we can definitely make a referral to [facility] to see if they can help [place] in this regard. He is in agreement with that plan. 03/26/2019 on evaluation today patient appears to be doing more poorly in regard to his foot ulcer at this time. He did see podiatry in order to see about getting a brace but unfortunately he tells me that Medicare is not likely to pay for one for him as he had 1 I guess 8 months ago. However this one that apparently goes in his shoes that does not seem to be working out well for him. Unfortunately there are signs of active infection at this time. No fevers, chills, nausea, vomiting, or diarrhea. Systemically. The patient has erythema on the dorsal surface of his foot that tracks from the lateral portion of his foot and again he does have a blister around the wound as well which is worse compared to previous. Overall I am concerned. 04/02/2019 on evaluation today patient  appears to be doing about the same with regard to his wound. Unfortunately there is no signs of significant improvement although I do think there is no signs of infection which is good news. His drainage is also slowed down tremendously. I think at this point he would benefit from the initiation of a total contact cast. The only concern I have is that over his balance. I explained to the patient that if we were to initiate the cast I think it would be greatly beneficial for him but he would need to ensure not to walk at all unless he  uses a walker he actually has 2 walkers one for inside his home and one that he keeps out in the car. For that reason he would be covered in that regard. I just do not want him to have any accidents and fall with the cast. 12/28-Patient comes in 1 week out a day early for his appointment he was in the hospital for 2 days for uncontrolled hyperglycemia and required management of high blood sugars, with some adjustments in his home regimen of insulin, patient stubbed his right great toe today and put a Band-Aid on it, he is here for his left lateral foot ulcer and we are using silver alginate dressing, he was intact total contact cast with difficulty with balance to the extent that that had to be discontinued 04/16/2019 on evaluation today patient appears to be doing better with regard to the overall appearance of his wound size. This is good news. We had attempted a total contact cast but unfortunately he had some falling issues that the family contacted [place] about we took the cast off we did not put it back on. Nonetheless it ended up that just a couple days later he was in the hospital due to elevations in his blood sugar which went to around 500 and even a little higher. Subsequently he was admitted to the hospital and was in the hospital for a total of 3 days and that included Christmas day. Fortunately there got his blood sugar under control he tells me that this is  running about 200 now which is much better news. There does not appear to be any signs of active infection at this time. No fevers, chills, nausea, vomiting, or diarrhea. 04/30/2019 on evaluation today patient actually appears to be doing slightly worse compared to previous as far as the overall size of his wound is concerned. There also is some need for sharp debridement today as well. Fortunately there is no evidence of active infection which is good news. No fevers, chills, nausea, vomiting, or diarrhea. 05/07/2019 upon evaluation today patient appears to be doing excellent in regard to his plantar foot ulcer. He has been tolerating the dressing changes without complication. Fortunately there is no signs of active infection at this time. No fevers, chills, nausea, vomiting, or diarrhea. He does tell me that he actually got in touch with [employer] and they feel like they can add diagnosis codes to his orders in order to get an external brace for him as opposed to the one that goes internal to issue. That would be excellent for his foot drop and I did tell him that if there is anything I need to fill out in that regard I will be more than happy to oblige and fill out what is needed for [employer]. 05/14/2019 upon evaluation today patient's original wound site actually appears to be doing much better which is good news. Fortunately there is no signs of active infection at this time. No fevers, chills, nausea, vomiting, or diarrhea. With that being said the patient unfortunately is continuing to have issues with rubbing he tells me that the felt slid on his foot although he did not realize it until today. Nonetheless I think this did cause a blister around the edge of the wound and he does an open wound that is slightly larger overall in measurement compared to last time mainly due to this blister. Other than that I feel like he is doing quite well. 05/21/2019 upon evaluation today patient appears to be doing  excellent in regard to his wound at this time on the plantar foot. He is showing signs of improvement he does have some slough and biofilm noted on the surface of the wound this is going require some sharp debridement today as well. Fortunately there is no evidence of active infection. No fevers, chills, nausea, vomiting, or diarrhea. 05/28/2019 on evaluation today patient's wound actually appears to be measuring smaller upon initial inspection and I do believe that it is doing well. With that being said he continues to have a blister on the outside/lateral edge of the wound that occurs as a result of rubbing due to his dropfoot currently. Subsequently we have filled out the paperwork for his new external brace but again that does take several weeks to get that completed once everything is returned to back we did send this back last week as far as the paperwork was concerned. With that being said there does not appear to be any signs of active infection at this time. 06/04/2019 on evaluation today patient's wound actually appears to be doing slightly better compared to last week which is good news. He still states that his insurance unfortunately does not seem to want to cover any of his new brace. He is going to just pay for this himself. Nonetheless [name], [name] ([id number]) he is going by there today when he leaves here that is [employer] in [place] that is going to be making this brace for him. 06/18/2019 upon evaluation today patient fortunately does have his new brace which is the external AFO brace with bilateral uprights. Subsequently he seems to be doing much better in fact the wound already appears better. He has not even been using the felt he is just using a dressing over top of the wound and overall I am very pleased with how things seem to be progressing. There is no signs of active infection at this time which is great news and I think that the AFO is good to help him tremendously as  far as trying to keep pressure off of the 06/25/2019 upon evaluation today patient seems to be making good progress in regard to his wound. He has been tolerating the dressing changes without complication. Fortunately there is no signs of active infection at this time. No fevers, chills, nausea, vomiting, or diarrhea. 07/02/2019 upon evaluation today patient unfortunately had a blistered area on the bottom of his foot tracking more towards the midline of the plantar aspect. He does not seem to have issues with rubbing more laterally that he used to have but nonetheless this is still giving him some trouble and causing problems here. Fortunately there is no signs of active infection at this time. No fevers, chills, nausea, vomiting, or diarrhea. 07/09/2019 upon evaluation today patient appears to be doing well with regard to his plantar foot ulcer. Definitely this is much improved compared to last week's visit which again last week the issue there was that he had an area of callus buildup on the plantar aspect of his foot I was able to clean this away and after effectively doing so the wound seems to be doing much better which is great news. There is no signs of active infection at this time. 07/16/2019 upon evaluation today patient appears to be making some progress in my opinion with regard to his plantar foot ulcer. He has been tolerating the dressing changes without complication. Fortunately there is no evidence of active infection at this time which is great news. 07/23/2019  upon evaluation today patient appears to be doing well with regard to his wounds on the plantar foot. He has been tolerating the dressing changes without complication. These do seem to be healing. 08/06/2019 upon evaluation today patient's wound actually appears to be showing some signs of improvement though he still has a significant amount of callus buildup over this location. That is can require some debridement today. With that  being said the actual wound openings seem to be dramatically improved which is great news. There is no signs of active infection at this time. No fevers, chills, nausea, vomiting, or diarrhea. 08/13/2019 upon evaluation today patient appears to be doing well with regard to his foot ulcer. He has been tolerating the dressing changes without complication. Fortunately there is no signs of active infection at this time. No fevers, chills, nausea, vomiting, or diarrhea. 08/20/2019 upon evaluation today patient appears to be doing excellent in regard to his foot ulcer. I do believe he still showing signs of improvement this is very slow but again I think still pressure getting to the area even with his brace and custom shoe is still part of the main issue with the delay here. Nonetheless I do not see any signs of worsening or infection all of which is good news. 5/20; left lateral foot wound. He wears an AFO brace for foot drop related to his diabetes. The wound is small and clean looking he is using silver collagen. We have apparently tried him in offloading shoe he could not tolerate this. He does not have good balance. Electronic Signature(s) Signed: 08/30/2019 5:46:06 PM By: Linton Ham MD Entered By: Linton Ham on 08/30/2019 13:08:40 [name] ([id number]) -------------------------------------------------------------------------------- Physical Exam Details Patient Name: [name] Date of Service: 08/30/2019 12:30 PM Medical Record Number: [id number] Patient Account Number: [id number] Date of Birth/Sex: 19-Aug-1945 (73 y.o. M) Treating RN: Army Melia Primary Care Provider: [name] Other Clinician: Referring Provider: [name] Treating Provider/Extender: Tito Dine in Treatment: 28 Constitutional Sitting or standing Blood Pressure is within target range for patient.. Pulse regular and within target range for patient.[name] Kitchen Respirations regular,  non- labored and within target range.. Temperature is normal and within the target range for the patient.[name] Kitchen appears in no distress. Cardiovascular Pedal pulses are palpable. Integumentary (Hair, Skin) No surrounding erythema. Notes Wound exam; area on the left lateral plantar foot. Unfortunately for this patient I think this is a weightbearing surface there is surrounding callus around the wound area. Nevertheless the base of this looks fairly healthy. There is no evidence of Electronic Signature(s) Signed: 08/30/2019 5:46:06 PM By: Linton Ham MD Entered By: Linton Ham on 08/30/2019 13:09:43 [name] ([id number]) -------------------------------------------------------------------------------- Physician Orders Details Patient Name: [name] Date of Service: 08/30/2019 12:30 PM Medical Record Number: [id number] Patient Account Number: [id number] Date of Birth/Sex: 01-Apr-1946 (73 y.o. M) Treating RN: Army Melia Primary Care Provider: [name] Other Clinician: Referring Provider: [name] Treating Provider/Extender: Tito Dine in Treatment: 71 Verbal / Phone Orders: No Diagnosis Coding Wound Cleansing Wound #5 Left,Lateral Foot o May shower with protection. Anesthetic (add to Medication List) Wound #5 Left,Lateral Foot o Topical Lidocaine 4% cream applied to wound bed prior to debridement (In Clinic Only). Primary Wound Dressing Wound #5 Left,Lateral Foot o Silver Collagen Secondary Dressing Wound #5 Left,Lateral Foot o Gauze and Kerlix/Conform o Other - felt pad for cushion Dressing Change Frequency Wound #5 Left,Lateral Foot o Change dressing every other day. Follow-up Appointments Wound #  5 Left,Lateral Foot o Return Appointment in 1 week. Edema Control Wound #5 Left,Lateral Foot o Elevate legs to the level of the heart and pump ankles as often as possible Additional Orders / Instructions Wound #5  Left,Lateral Foot o Other: - Patient to follow-up with [facility] for adjustments to braces and shoes. Requested paperwork sent to [employer]. Patient Medications Allergies: tetracycline Notifications Medication Indication Start End clindamycin HCl 09/07/2019 DOSE 1 - oral 300 mg capsule - 1 capsule oral taken 3 times a day for 14 days Electronic Signature(s) Signed: 09/07/2019 1:21:56 PM By: Worthy Keeler [name] Signed: 09/12/2019 4:41:32 PM By: Linton Ham MD Previous Signature: 08/30/2019 5:18:09 PM Version By: Army Melia Previous Signature: 08/30/2019 5:46:06 PM Version By: Linton Ham MD Entered By: Worthy Keeler on 09/07/2019 13:21:56 [name], [name] ([id number]) [name], [name] ([id number]) -------------------------------------------------------------------------------- Problem List Details Patient Name: [name] Date of Service: 08/30/2019 12:30 PM Medical Record Number: [id number] Patient Account Number: [id number] Date of Birth/Sex: January 07, 1946 (74 y.o. M) Treating RN: Army Melia Primary Care Provider: [name] Other Clinician: Referring Provider: [name] Treating Provider/Extender: Tito Dine in Treatment: 28 Active Problems ICD-10 Encounter Code Description Active Date MDM Diagnosis E11.621 Type 2 diabetes mellitus with foot ulcer 02/09/2019 No Yes E11.42 Type 2 diabetes mellitus with diabetic polyneuropathy 02/09/2019 No Yes L97.522 Non-pressure chronic ulcer of other part of left foot with fat layer 02/09/2019 No Yes exposed M21.372 Foot drop, left foot 02/09/2019 No Yes Inactive Problems Resolved Problems Electronic Signature(s) Signed: 08/30/2019 5:46:06 PM By: Linton Ham MD Entered By: Linton Ham on 08/30/2019 13:07:37 [name] ([id number]) -------------------------------------------------------------------------------- Progress Note Details Patient Name: [name] Date of Service:  08/30/2019 12:30 PM Medical Record Number: [id number] Patient Account Number: [id number] Date of Birth/Sex: April 15, 1945 (73 y.o. M) Treating RN: Army Melia Primary Care Provider: [name] Other Clinician: Referring Provider: [name] Treating Provider/Extender: Tito Dine in Treatment: 28 Subjective History of Present Illness (HPI) 10/04/16 on evaluation today patient presents for initial evaluation concerning a laceration of the right wrist and hand on the bowler aspect. He tells me that he fell into glass following a medication change in regard to his diabetes. He initially went to [facility] where she was sutured and subsequently his daughter who is a Marine scientist removed the teachers at the appropriate time. Unfortunately the wound has done poorly and dehissed which has caused him some trouble including infection. He has been on antibiotics both topically and orally though the wound continues to show signs of necrotic tissue according to notes which is what he was sent to [place] for evaluation concerning. He does have diabetes and unfortunately this is severely uncontrolled. He does see an endocrinologist and is working on this. 10/11/2016 -- the patient's notes from the ER were reviewed and he had his suturing done on 09/17/2016 and had necrotic skin flaps which had to be trimmed the last visit he was here. He has managed to get his Santyl ointment and has been using this appropriately. 10/21/16 patient's wound appears to be doing very well on evaluation today. He still has some Slough covering the wound bed but this is nowhere near as severe as when i saw this initially two weeks ago nor even my review of his pictures last week. I'm pleased with how this is progressing. Patient's wound over the right form appears to be doing fairly well on evaluation today. There is no evidence of infection and he has  a small area at the crease of where she is wrist extends and flexes  that is still open and appears to be having difficulty due to the fact that it is cracking open. I think this is something that may be controlled with moisture control that is keeping the wound a little bit more moist. Nonetheless this is a tiny region compared to the initial wound and overall appears to be doing very well. No fevers, chills, nausea, or vomiting noted at this time. 11/25/16 on evaluation today patient appears to be doing well in regard to his right wrist ulcer. In fact this appears to be completely healed he is having no discomfort. He is pleased with how this is progressed. Readmission: 06/24/17 on evaluation today patient appears back in our clinic regarding issues that he has been having with his right ankle on the lateral malleolus as well is in the dorsal surface of his left foot. Both have been going on for several weeks unfortunately. He tells me that initially this was being managed by Dr. Edilia Bo and I did review the notes from Dr. Edilia Bo in epic. Patient was placed on amoxicillin initially and then subsequently Keflex following. He has been using triple antibiotic ointment over-the-counter along with a Band-Aid for the wound currently. He did not have any Santyl remaining. His most recent hemoglobin A1c which was just two days ago was 10.3. Fortunately he seems to have excellent blood flow in the bilateral lower extremities with his left ABI being 0.95 in the right ABI 1.01. Patient does have some pain in regard to each of these ulcerations but he tells me it's not nearly as severe as the hand wound that I help treat earlier over the summer. This was in 2018. 07/15/17 on evaluation today patient's lower surety ulcer is actually both appear to be doing fairly well at this point. He does have a little bit of skin overlapping the ulcer on the right lateral ankle this was debrided away today just with saline and gauze without complication patient had no discomfort or pain.  Otherwise patient's left foot ulcer does appear to be showing signs of improvement he does seem to have a right great toe. Nikki and noted at this point in time. I think that this does need to be managed at this point. No fevers, chills, nausea, or vomiting noted at this time. 07/22/17 on evaluation today patient's wounds in regard to his lower extremities appear to be doing somewhat better. His right ankle wound in fact appears to likely be completely closed. The left dorsal foot ulcer though still open does appear to be a little bit smaller and does also seem to be making good progress. He has not been having problems with the treatment at this point in the general he does not seem to show as much erythema surrounding the wound bed which is good news. He is not having significant discomfort. 08/12/17 on evaluation today patients wounds of both appeared to be doing better in fact they both appear to be completely healed. Overall I'm very pleased with how things have progressed over the past week even more than what I expected. Obviously this is good news and he is happy about this. He did have a fall when going to his orthopedic doctor recently fortunately he does not appear to have injured anything severely but nonetheless does have a couple of bumps and bruises nothing significant at this time but he needs to be seen and wound care for. He  may have broken his left wrist he is following up with orthopedics in that regard. Readmission: 10/24/17 on evaluation today patient presents for reevaluation after having been discharged Aug 12, 2017. His wound was healed at that point although he states that it has been somewhat discolored since that time. He has not experienced the drainage as far as the wound is concerned but the fact that it was still discolored been greater than two months after the fact made him want to come in for reevaluation. Upon inspection today the patient has no pain although he does  have neuropathy. He does have a discolored region of the dorsal surface of the left foot which was the site where the ulcer was that we previously treated. The good news is he does not again have any discomfort although obviously I do believe this may be some scar tissue and just discoloration in that regard. Again he hasn't had any drainage or discharge since I last saw him and at this point I see no evidence of that either this area shows complete epithelialization. In general I do not feel like there's any significant issue at this point in that regard. 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 He has been in this clinic before in 2018 with a laceration on his right hand and then again in 2019 with an area over the right lateral malleolus. 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 He states that the wound is been there for the last week or [name], [name]. ([id number]) 2. It is been uncomfortable to walk on. The man is an active man having to look after an elderly mother. Past medical history, type 2 diabetes with polyneuropathy with a recent hemoglobin A1c in September 2011 0.3, lumbar disc disease, hypergonadism., Left foot drop and gout. We did not repeat his ABIs in the clinic quoting values from July 2019 of 0.95 and 1.01. He is not felt to have significant PAD 02/19/2019 on evaluation today patient actually appears to be doing better with regard to his foot wound based on what I am seeing currently. He was seen by Dr. Dellia Nims on readmission back into the clinic last week when I was off. The good news is the patient has been tolerating the dressing changes without any complication in fact has not really had much in the way of drainage. He does have an AFO brace which she believes is actually pushing on this area as well and  has contributed to the issue. Fortunately there is no signs of active infection at this point. His ABIs appear to be well. 03/05/2019 on evaluation today patient appears to be doing well in regard to his foot ulcer. He has been tolerating the dressing changes without complication. Fortunately there is no signs of active infection. The wound is measuring somewhat smaller and overall I feel like he is making good progress at this point. 03/12/2019 on evaluation today patient actually appears to be doing quite well with regard to his foot ulcer. The wound is looking better the measurement is not significantly better at this point unfortunately but nonetheless again the overall appearance is improving I think he is making some progress. I still think the big issue here is foot drop and again the patient wants to see if potentially he could get a brace to help with this.  Subsequently I think that we can definitely make a referral to [facility] to see if they can help [place] in this regard. He is in agreement with that plan. 03/26/2019 on evaluation today patient appears to be doing more poorly in regard to his foot ulcer at this time. He did see podiatry in order to see about getting a brace but unfortunately he tells me that Medicare is not likely to pay for one for him as he had 1 I guess 8 months ago. However this one that apparently goes in his shoes that does not seem to be working out well for him. Unfortunately there are signs of active infection at this time. No fevers, chills, nausea, vomiting, or diarrhea. Systemically. The patient has erythema on the dorsal surface of his foot that tracks from the lateral portion of his foot and again he does have a blister around the wound as well which is worse compared to previous. Overall I am concerned. 04/02/2019 on evaluation today patient appears to be doing about the same with regard to his wound. Unfortunately there is no signs of significant improvement  although I do think there is no signs of infection which is good news. His drainage is also slowed down tremendously. I think at this point he would benefit from the initiation of a total contact cast. The only concern I have is that over his balance. I explained to the patient that if we were to initiate the cast I think it would be greatly beneficial for him but he would need to ensure not to walk at all unless he uses a walker he actually has 2 walkers one for inside his home and one that he keeps out in the car. For that reason he would be covered in that regard. I just do not want him to have any accidents and fall with the cast. 12/28-Patient comes in 1 week out a day early for his appointment he was in the hospital for 2 days for uncontrolled hyperglycemia and required management of high blood sugars, with some adjustments in his home regimen of insulin, patient stubbed his right great toe today and put a Band-Aid on it, he is here for his left lateral foot ulcer and we are using silver alginate dressing, he was intact total contact cast with difficulty with balance to the extent that that had to be discontinued 04/16/2019 on evaluation today patient appears to be doing better with regard to the overall appearance of his wound size. This is good news. We had attempted a total contact cast but unfortunately he had some falling issues that the family contacted [place] about we took the cast off we did not put it back on. Nonetheless it ended up that just a couple days later he was in the hospital due to elevations in his blood sugar which went to around 500 and even a little higher. Subsequently he was admitted to the hospital and was in the hospital for a total of 3 days and that included Christmas day. Fortunately there got his blood sugar under control he tells me that this is running about 200 now which is much better news. There does not appear to be any signs of active infection at this time. No  fevers, chills, nausea, vomiting, or diarrhea. 04/30/2019 on evaluation today patient actually appears to be doing slightly worse compared to previous as far as the overall size of his wound is concerned. There also is some need for sharp debridement today  as well. Fortunately there is no evidence of active infection which is good news. No fevers, chills, nausea, vomiting, or diarrhea. 05/07/2019 upon evaluation today patient appears to be doing excellent in regard to his plantar foot ulcer. He has been tolerating the dressing changes without complication. Fortunately there is no signs of active infection at this time. No fevers, chills, nausea, vomiting, or diarrhea. He does tell me that he actually got in touch with [employer] and they feel like they can add diagnosis codes to his orders in order to get an external brace for him as opposed to the one that goes internal to issue. That would be excellent for his foot drop and I did tell him that if there is anything I need to fill out in that regard I will be more than happy to oblige and fill out what is needed for [employer]. 05/14/2019 upon evaluation today patient's original wound site actually appears to be doing much better which is good news. Fortunately there is no signs of active infection at this time. No fevers, chills, nausea, vomiting, or diarrhea. With that being said the patient unfortunately is continuing to have issues with rubbing he tells me that the felt slid on his foot although he did not realize it until today. Nonetheless I think this did cause a blister around the edge of the wound and he does an open wound that is slightly larger overall in measurement compared to last time mainly due to this blister. Other than that I feel like he is doing quite well. 05/21/2019 upon evaluation today patient appears to be doing excellent in regard to his wound at this time on the plantar foot. He is showing signs of improvement he does have some slough  and biofilm noted on the surface of the wound this is going require some sharp debridement today as well. Fortunately there is no evidence of active infection. No fevers, chills, nausea, vomiting, or diarrhea. 05/28/2019 on evaluation today patient's wound actually appears to be measuring smaller upon initial inspection and I do believe that it is doing well. With that being said he continues to have a blister on the outside/lateral edge of the wound that occurs as a result of rubbing due to his dropfoot currently. Subsequently we have filled out the paperwork for his new external brace but again that does take several weeks to get that completed once everything is returned to back we did send this back last week as far as the paperwork was concerned. With that being said there does not appear to be any signs of active infection at this time. 06/04/2019 on evaluation today patient's wound actually appears to be doing slightly better compared to last week which is good news. He still states that his insurance unfortunately does not seem to want to cover any of his new brace. He is going to just pay for this himself. Nonetheless he is going by there today when he leaves here that is [employer] in [place] that is going to be making this brace for him. 06/18/2019 upon evaluation today patient fortunately does have his new brace which is the external AFO brace with bilateral uprights. Subsequently he seems to be doing much better in fact the wound already appears better. He has not even been using the felt he is just using a dressing over [place] ([id number]) top of the wound and overall I am very pleased with how things seem to be progressing. There is no signs of active  infection at this time which is great news and I think that the AFO is good to help him tremendously as far as trying to keep pressure off of the 06/25/2019 upon evaluation today patient seems to be making good progress in regard to  his wound. He has been tolerating the dressing changes without complication. Fortunately there is no signs of active infection at this time. No fevers, chills, nausea, vomiting, or diarrhea. 07/02/2019 upon evaluation today patient unfortunately had a blistered area on the bottom of his foot tracking more towards the midline of the plantar aspect. He does not seem to have issues with rubbing more laterally that he used to have but nonetheless this is still giving him some trouble and causing problems here. Fortunately there is no signs of active infection at this time. No fevers, chills, nausea, vomiting, or diarrhea. 07/09/2019 upon evaluation today patient appears to be doing well with regard to his plantar foot ulcer. Definitely this is much improved compared to last week's visit which again last week the issue there was that he had an area of callus buildup on the plantar aspect of his foot I was able to clean this away and after effectively doing so the wound seems to be doing much better which is great news. There is no signs of active infection at this time. 07/16/2019 upon evaluation today patient appears to be making some progress in my opinion with regard to his plantar foot ulcer. He has been tolerating the dressing changes without complication. Fortunately there is no evidence of active infection at this time which is great news. 07/23/2019 upon evaluation today patient appears to be doing well with regard to his wounds on the plantar foot. He has been tolerating the dressing changes without complication. These do seem to be healing. 08/06/2019 upon evaluation today patient's wound actually appears to be showing some signs of improvement though he still has a significant amount of callus buildup over this location. That is can require some debridement today. With that being said the actual wound openings seem to be dramatically improved which is great news. There is no signs of active infection  at this time. No fevers, chills, nausea, vomiting, or diarrhea. 08/13/2019 upon evaluation today patient appears to be doing well with regard to his foot ulcer. He has been tolerating the dressing changes without complication. Fortunately there is no signs of active infection at this time. No fevers, chills, nausea, vomiting, or diarrhea. 08/20/2019 upon evaluation today patient appears to be doing excellent in regard to his foot ulcer. I do believe he still showing signs of improvement this is very slow but again I think still pressure getting to the area even with his brace and custom shoe is still part of the main issue with the delay here. Nonetheless I do not see any signs of worsening or infection all of which is good news. 5/20; left lateral foot wound. He wears an AFO brace for foot drop related to his diabetes. The wound is small and clean looking he is using silver collagen. We have apparently tried him in offloading shoe he could not tolerate this. He does not have good balance. Objective Constitutional Sitting or standing Blood Pressure is within target range for patient.. Pulse regular and within target range for patient.[name] Kitchen Respirations regular, non- labored and within target range.. Temperature is normal and within the target range for the patient.[name] Kitchen appears in no distress. Vitals Time Taken: 12:50 PM, Height: 71 in, Weight: 205 lbs, BMI:  28.6, Temperature: 98.0 F, Pulse: 120 bpm, Respiratory Rate: 18 breaths/min, Blood Pressure: 130/65 mmHg. Cardiovascular Pedal pulses are palpable. General Notes: Wound exam; area on the left lateral plantar foot. Unfortunately for this patient I think this is a weightbearing surface there is surrounding callus around the wound area. Nevertheless the base of this looks fairly healthy. There is no evidence of Integumentary (Hair, Skin) No surrounding erythema. Wound #5 status is Open. Original cause of wound was Pressure Injury. The wound is located on  the Left,Lateral Foot. The wound measures 0.5cm length x 0.3cm width x 0.3cm depth; 0.118cm^2 area and 0.035cm^3 volume. There is Fat Layer (Subcutaneous Tissue) Exposed exposed. There is no tunneling or undermining noted. There is a medium amount of serosanguineous drainage noted. The wound margin is flat and intact. There is large (67-100%) pink granulation within the wound bed. There is no necrotic tissue within the wound bed. Assessment Active Problems ICD-10 Type 2 diabetes mellitus with foot ulcer Type 2 diabetes mellitus with diabetic polyneuropathy [name], [name]. ([id number]) Non-pressure chronic ulcer of other part of left foot with fat layer exposed Foot drop, left foot Plan Wound Cleansing: Wound #5 Left,Lateral Foot: May shower with protection. Anesthetic (add to Medication List): Wound #5 Left,Lateral Foot: Topical Lidocaine 4% cream applied to wound bed prior to debridement (In Clinic Only). Primary Wound Dressing: Wound #5 Left,Lateral Foot: Silver Collagen Secondary Dressing: Wound #5 Left,Lateral Foot: Gauze and Kerlix/Conform Other - felt pad for cushion Dressing Change Frequency: Wound #5 Left,Lateral Foot: Change dressing every other day. Follow-up Appointments: Wound #5 Left,Lateral Foot: Return Appointment in 1 week. Edema Control: Wound #5 Left,Lateral Foot: Elevate legs to the level of the heart and pump ankles as often as possible Additional Orders / Instructions: Wound #5 Left,Lateral Foot: Other: - Patient to follow-up with [employer] for adjustments to braces and shoes. Requested paperwork sent to [employer]. 1. Left lateral foot silver collagen border foam 2. Wound does not look unhealthy however I think this is an offloading issue. Electronic Signature(s) Signed: 08/30/2019 5:46:06 PM By: Linton Ham MD Entered By: Linton Ham on 08/30/2019 13:10:39 [name]  ([id number]) -------------------------------------------------------------------------------- [facility] Details Patient Name: [name] Date of Service: 08/30/2019 Medical Record Number: [id number] Patient Account Number: [id number] Date of Birth/Sex: 1945-09-04 (73 y.o. M) Treating RN: Army Melia Primary Care Provider: [name] Other Clinician: Referring Provider: [name] Treating Provider/Extender: Tito Dine in Treatment: 28 Diagnosis Coding ICD-10 Codes Code Description E11.621 Type 2 diabetes mellitus with foot ulcer E11.42 Type 2 diabetes mellitus with diabetic polyneuropathy L97.522 Non-pressure chronic ulcer of other part of left foot with fat layer exposed M21.372 Foot drop, left foot Facility Procedures CPT4 Code: [id number] Description: 99213 - WOUND CARE VISIT-LEV 3 EST PT Modifier: Quantity: 1 Physician Procedures CPT4 Code: 7209470 Description: 96283 - WC PHYS LEVEL 3 - EST PT Modifier: Quantity: 1 CPT4 Code: Description: ICD-10 Diagnosis Description E11.621 Type 2 diabetes mellitus with foot ulcer L97.522 Non-pressure chronic ulcer of other part of left foot with fat layer ex Modifier: posed Quantity: Electronic Signature(s) Signed: 08/30/2019 5:46:06 PM By: Linton Ham MD Entered By: Linton Ham on 08/30/2019 13:11:03

## 2019-09-03 ENCOUNTER — Ambulatory Visit: Payer: Medicare Other | Admitting: Physician Assistant

## 2019-09-06 ENCOUNTER — Encounter: Payer: Self-pay | Admitting: Family Medicine

## 2019-09-06 ENCOUNTER — Telehealth (INDEPENDENT_AMBULATORY_CARE_PROVIDER_SITE_OTHER): Payer: Medicare Other | Admitting: Family Medicine

## 2019-09-06 VITALS — Ht 71.5 in

## 2019-09-06 DIAGNOSIS — J3489 Other specified disorders of nose and nasal sinuses: Secondary | ICD-10-CM | POA: Diagnosis not present

## 2019-09-06 DIAGNOSIS — R0982 Postnasal drip: Secondary | ICD-10-CM

## 2019-09-06 DIAGNOSIS — J309 Allergic rhinitis, unspecified: Secondary | ICD-10-CM | POA: Diagnosis not present

## 2019-09-06 MED ORDER — MONTELUKAST SODIUM 10 MG PO TABS: 10.0000 mg | ORAL_TABLET | Freq: Every day | ORAL | 3 refills | Status: DC

## 2019-09-06 MED ORDER — PREDNISONE 10 MG PO TABS: 10.0000 mg | ORAL_TABLET | Freq: Every day | ORAL | 0 refills | Status: DC

## 2019-09-06 NOTE — Assessment & Plan Note (Signed)
Continue allegra, flonase, nasal saline irrigation.  Add singulair.

## 2019-09-06 NOTE — Assessment & Plan Note (Signed)
Most likely allergy related. No clear sign of COVID other viral infection or bacteria super infection.   Treat with low dose pred taper.   If not improving  In 2 weeks or if worsening symptoms sooner.. recommend COVID testing and in person exam.

## 2019-09-06 NOTE — Progress Notes (Signed)
VIRTUAL VISIT Due to national recommendations of social distancing due to [place] 19, a virtual visit is felt to be most appropriate for this patient at this time.   I connected with the patient on 09/06/19 at  2:00 PM EDT by virtual telehealth platform and verified that I am speaking with the correct person using two identifiers.   I discussed the limitations, risks, security and privacy concerns of performing an evaluation and management service by  virtual telehealth platform and the availability of in person appointments. I also discussed with the patient that there may be a patient responsible charge related to this service. The patient expressed understanding and agreed to proceed.  Patient location: Home Provider Location: [facility] Participants: Eliezer Lofts and [name]   Chief Complaint  Patient presents with  . Sinus Drainage  . Change in Taste    History of Present Illness:  74 year old male presents with new onset nasal congestion and post nasal drip x 3 days.   Cough productive White  Mucus.  Scratchy throat, no pain.  Left ear is painful but has   No wheeze, no SOB.  Has noted BBQ taste on tounge.   no diarrhea, no abd pain No fever  No flu like symptoms.   Head pressure.. using tylenol.  Has been treating with warm compresses. Using Allegra and mucinex in last week.  Flonase 2 sprays per nostril.   Netty pot.   No asthma or chronic lung disease.   S/P COVID 19   COVID 19 screen No recent travel or known exposure to [place]  The importance of social distancing was discussed today.   Review of Systems  Constitutional: Negative for chills and fever.  HENT: Positive for congestion and sinus pain. Negative for ear pain.   Eyes: Negative for pain and redness.  Respiratory: Negative for cough and shortness of breath.   Cardiovascular: Negative for chest pain, palpitations and leg swelling.  Gastrointestinal: Negative for abdominal pain, blood  in stool, constipation, diarrhea, nausea and vomiting.  Genitourinary: Negative for dysuria.  Musculoskeletal: Negative for falls and myalgias.  Skin: Negative for rash.  Neurological: Negative for dizziness.  Psychiatric/Behavioral: Negative for depression. The patient is not nervous/anxious.       Past Medical History:  Diagnosis Date  . Allergic rhinitis due to pollen   . Chronic kidney disease, stage III (moderate) 12/19/2012  . Depression   . Diabetes mellitus ([place])   . Diverticulosis   . Erectile dysfunction associated with type 2 diabetes mellitus ([place]) 05/28/2015  . Former very heavy cigarette smoker (more than 40 per day) 10/07/2014  . GERD (gastroesophageal reflux disease)   . Gout   . Hyperlipidemia   . Hypertension   . Hypogonadism male 06/14/2012  . Iron deficiency   . Memory loss   . Osteoarthritis   . Personal history of colonic polyps    1996    reports that he quit smoking about 19 years ago. His smoking use included cigarettes. He has a 105.00 pack-year smoking history. He has never used smokeless tobacco. He reports that he does not drink alcohol or use drugs.   Current Outpatient Medications:  .  acetaminophen (QC ACETAMINOPHEN 8HR ARTH PAIN) 650 MG CR tablet, Take 650 mg by mouth every 8 (eight) hours as needed for pain., Disp: , Rfl:  .  ampicillin (PRINCIPEN) 500 MG capsule, Take 500 mg by mouth daily., Disp: , Rfl:  .  Ascorbic Acid (VITAMIN C) 1000  MG tablet, Take 1,000 mg by mouth daily., Disp: , Rfl:  .  aspirin EC 81 MG tablet, Take 81 mg by mouth at bedtime., Disp: , Rfl:  .  atenolol (TENORMIN) 50 MG tablet, TAKE 1 TABLET BY MOUTH EVERY DAY, Disp: 90 tablet, Rfl: 1 .  cholecalciferol (VITAMIN D) 1000 UNITS tablet, Take 1,000 Units by mouth daily.  , Disp: , Rfl:  .  colchicine 0.6 MG tablet, Take 1 tablet (0.6 mg total) by mouth 2 (two) times daily., Disp: 60 tablet, Rfl: 2 .  donepezil (ARICEPT) 10 MG tablet, TAKE 1 TABLET BY MOUTH AT BEDTIME, Disp: 90  tablet, Rfl: 3 .  doxazosin (CARDURA) 8 MG tablet, TAKE ONE-HALF TABLET AT BEDTIME, Disp: 45 tablet, Rfl: 3 .  feeding supplement, GLUCERNA SHAKE, (GLUCERNA SHAKE) LIQD, Take 237 mLs by mouth 3 (three) times daily between meals., Disp: 10000 mL, Rfl: 0 .  finasteride (PROSCAR) 5 MG tablet, Take 1 tablet (5 mg total) by mouth daily., Disp: 30 tablet, Rfl: 2 .  fluconazole (DIFLUCAN) 100 MG tablet, Take 1 tablet (100 mg total) by mouth daily., Disp: 5 tablet, Rfl: 0 .  fluticasone (FLONASE) 50 MCG/ACT nasal spray, PLACE 2 SPRAYS IN EACH NOSTRIL DAILY, Disp: 48 mL, Rfl: 3 .  ibuprofen (ADVIL,MOTRIN) 200 MG tablet, Take 200-400 mg by mouth every 6 (six) hours as needed for headache (pain)., Disp: , Rfl:  .  indomethacin (INDOCIN) 50 MG capsule, Take 1 capsule (50 mg total) by mouth 2 (two) times daily with a meal., Disp: 30 capsule, Rfl: 0 .  insulin NPH Human (HUMULIN N,NOVOLIN N) 100 UNIT/ML injection, Inject 20 Units into the skin 2 (two) times daily before a meal. , Disp: 10 mL, Rfl:  .  insulin regular (NOVOLIN R,HUMULIN R) 100 units/mL injection, Inject 0.2 mLs (20 Units total) into the skin 2 (two) times daily before a meal., Disp: 10 mL, Rfl:  .  lisinopril (ZESTRIL) 10 MG tablet, TAKE 1 TABLET BY MOUTH EVERY DAY, Disp: 90 tablet, Rfl: 3 .  memantine (NAMENDA) 10 MG tablet, Take 1 tablet (10 mg total) by mouth 2 (two) times daily., Disp: 180 tablet, Rfl: 3 .  nutrition supplement, JUVEN, (JUVEN) PACK, Take 1 packet by mouth 2 (two) times daily between meals., Disp: 30 packet, Rfl: 0 .  omeprazole (PRILOSEC) 40 MG capsule, Take 1 capsule (40 mg total) by mouth 2 (two) times daily before a meal., Disp: 180 capsule, Rfl: 1 .  ondansetron (ZOFRAN) 4 MG tablet, Take 1 tablet (4 mg total) by mouth every 8 (eight) hours as needed for nausea or vomiting., Disp: 30 tablet, Rfl: 1 .  ondansetron (ZOFRAN-ODT) 8 MG disintegrating tablet, Take 1 tablet (8 mg total) by mouth 2 (two) times daily., Disp: 60  tablet, Rfl: 2 .  OZEMPIC, 0.25 OR 0.5 MG/DOSE, 2 MG/1.5ML SOPN, INJECT 0.5 MG INTO THE SKIN ONCE A WEEK., Disp: 3 pen, Rfl: 2 .  sildenafil (REVATIO) 20 MG tablet, Generic Revatio / Sildanefil 20 mg. 2 - 5 tabs 30 mins prior to intercourse., Disp: 10 tablet, Rfl: 11 .  simvastatin (ZOCOR) 40 MG tablet, TAKE 1 TABLET BY MOUTH AT BEDTIME, Disp: 90 tablet, Rfl: 2 .  valACYclovir (VALTREX) 1000 MG tablet, Take 1,000 mg by mouth 3 (three) times daily., Disp: , Rfl:  .  venlafaxine XR (EFFEXOR-XR) 75 MG 24 hr capsule, TAKE 1 CAPSULE BY MOUTH EVERY MORNING AND 2 CAPSULES AT BEDTIME, Disp: 270 capsule, Rfl: 1  Current Facility-Administered Medications:  .  testosterone cypionate (DEPOTESTOSTERONE CYPIONATE) injection 150 mg, 150 mg, Intramuscular, Q14 Days, Copland, Spencer, MD, 150 mg at 08/29/19 1150   Observations/Objective: Height 5' 11.5" (1.816 m).  Physical Exam  Physical Exam Constitutional:      General: The patient is not in acute distress. Pulmonary:     Effort: Pulmonary effort is normal. No respiratory distress.  Neurological:     Mental Status: The patient is alert and oriented to person, place, and time.  Psychiatric:        Mood and Affect: Mood normal.        Behavior: Behavior normal.   Assessment and Plan    I discussed the assessment and treatment plan with the patient. The patient was provided an opportunity to ask questions and all were answered. The patient agreed with the plan and demonstrated an understanding of the instructions.   The patient was advised to call back or seek an in-person evaluation if the symptoms worsen or if the condition fails to improve as anticipated.  Sinus pressure Most likely allergy related. No clear sign of COVID other viral infection or bacteria super infection.   Treat with low dose pred taper.   If not improving  In 2 weeks or if worsening symptoms sooner.. recommend COVID testing and in person exam.   Allergic rhinitis with  postnasal drip Continue allegra, flonase, nasal saline irrigation.  Add singulair.      Eliezer Lofts, MD

## 2019-09-07 ENCOUNTER — Other Ambulatory Visit: Payer: Self-pay | Admitting: Family Medicine

## 2019-09-07 MED ORDER — TESTOSTERONE CYPIONATE 150 MG/ML IJ SOLN: 1.0000 mL | INTRAMUSCULAR | 1 refills | Status: DC

## 2019-09-07 NOTE — Telephone Encounter (Signed)
Appreciated and corrected

## 2019-09-07 NOTE — Addendum Note (Signed)
Addended by: [name] on: 09/07/2019 04:44 PM   Modules accepted: Orders

## 2019-09-07 NOTE — Telephone Encounter (Signed)
It looks like it comes in 150 mg/ml vials.  Would that be easier?

## 2019-09-07 NOTE — Telephone Encounter (Signed)
Last office visit 09/06/2019 with Dr. Diona Browner for sinus pressure/allegic rhinitis.  Medication list states 150 mg.  Refill request is for 200 mg.  Please change Rx if there has been a change.

## 2019-09-07 NOTE — Telephone Encounter (Signed)
3/4 = 150 mg

## 2019-09-11 ENCOUNTER — Encounter: Payer: Medicare Other | Attending: Physician Assistant | Admitting: Physician Assistant

## 2019-09-11 ENCOUNTER — Other Ambulatory Visit
Admission: RE | Admit: 2019-09-11 | Discharge: 2019-09-11 | Disposition: A | Discharge status: Home / Self Care | Payer: Medicare Other | Source: Ambulatory Visit | Attending: Physician Assistant | Admitting: Physician Assistant

## 2019-09-11 ENCOUNTER — Other Ambulatory Visit: Payer: Self-pay

## 2019-09-11 DIAGNOSIS — M21372 Foot drop, left foot: Secondary | ICD-10-CM | POA: Diagnosis not present

## 2019-09-11 DIAGNOSIS — L089 Local infection of the skin and subcutaneous tissue, unspecified: Secondary | ICD-10-CM | POA: Insufficient documentation

## 2019-09-11 DIAGNOSIS — E11621 Type 2 diabetes mellitus with foot ulcer: Secondary | ICD-10-CM | POA: Diagnosis not present

## 2019-09-11 DIAGNOSIS — E1142 Type 2 diabetes mellitus with diabetic polyneuropathy: Secondary | ICD-10-CM | POA: Diagnosis not present

## 2019-09-11 DIAGNOSIS — L97522 Non-pressure chronic ulcer of other part of left foot with fat layer exposed: Secondary | ICD-10-CM | POA: Insufficient documentation

## 2019-09-11 DIAGNOSIS — L97529 Non-pressure chronic ulcer of other part of left foot with unspecified severity: Secondary | ICD-10-CM | POA: Diagnosis present

## 2019-09-11 NOTE — Progress Notes (Signed)
[name] ([id number]) Visit Report for 09/11/2019 Arrival Information Details Patient Name: [name] Date of Service: 09/11/2019 12:45 PM Medical Record Number: [id number] Patient Account Number: [id number] Date of Birth/Sex: 23-Jul-1945 (74 y.o. M) Treating RN: Montey Hora Primary Care Hillel Card: [name] Other Clinician: Referring Liban Guedes: [name] Treating Riki Gehring/Extender: Melburn Hake, HOYT Weeks in Treatment: 28 Visit Information History Since Last Visit Added or deleted any medications: No Patient Arrived: [place] Any new allergies or adverse reactions: No Arrival Time: 12:51 Had a fall or experienced change in No Accompanied By: self activities of daily living that may affect Transfer Assistance: None risk of falls: Patient Identification Verified: Yes Signs or symptoms of abuse/neglect since last visito No Secondary Verification Process Completed: Yes Hospitalized since last visit: No Patient Has Alerts: Yes Implantable device outside of the clinic excluding No Patient Alerts: DMII cellular tissue based products placed in the center since last visit: Has Dressing in Place as Prescribed: Yes Pain Present Now: No Electronic Signature(s) Signed: 09/11/2019 4:34:14 PM By: Lorine Bears RCP, RRT, CHT Entered By: Lorine Bears on 09/11/2019 12:52:25 [name] ([id number]) -------------------------------------------------------------------------------- Encounter Discharge Information Details Patient Name: [name] Date of Service: 09/11/2019 12:45 PM Medical Record Number: [id number] Patient Account Number: [id number] Date of Birth/Sex: 1945/06/26 (73 y.o. M) Treating RN: Montey Hora Primary Care Marvina Danner: [name] Other Clinician: Referring Eyla Tallon: [name] Treating Durell Lofaso/Extender: Melburn Hake, HOYT Weeks in Treatment: 30 Encounter Discharge Information Items Post Procedure  Vitals Discharge Condition: Stable Temperature (F): 97.8 Ambulatory Status: Ambulatory Pulse (bpm): 83 Discharge Destination: Home Respiratory Rate (breaths/min): 16 Transportation: Private Auto Blood Pressure (mmHg): 161/68 Accompanied By: self Schedule Follow-up Appointment: Yes Clinical Summary of Care: Electronic Signature(s) Signed: 09/11/2019 2:02:10 PM By: Montey Hora Entered By: Montey Hora on 09/11/2019 14:02:10 [name] ([id number]) -------------------------------------------------------------------------------- Lower Extremity Assessment Details Patient Name: [name] Date of Service: 09/11/2019 12:45 PM Medical Record Number: [id number] Patient Account Number: [id number] Date of Birth/Sex: 12/25/1945 (73 y.o. M) Treating RN: Montey Hora Primary Care Ezri Fanguy: [name] Other Clinician: Referring Makiah Foye: [name] Treating Nolen Lindamood/Extender: [name], HOYT Weeks in Treatment: 30 Edema Assessment Assessed: [Left: No] [Right: No] Edema: [Left: N] [Right: o] Vascular Assessment Pulses: Dorsalis Pedis Palpable: [Left:Yes] Electronic Signature(s) Signed: 09/11/2019 4:28:28 PM By: Montey Hora Entered By: Montey Hora on 09/11/2019 12:55:35 [name] ([id number]) -------------------------------------------------------------------------------- Multi Wound Chart Details Patient Name: [name] Date of Service: 09/11/2019 12:45 PM Medical Record Number: [id number] Patient Account Number: [id number] Date of Birth/Sex: Mar 20, 1946 (73 y.o. M) Treating RN: Montey Hora Primary Care Saul Dorsi: [name] Other Clinician: Referring Camdon Saetern: [name] Treating Lymon Kidney/Extender: [name], HOYT Weeks in Treatment: 30 Vital Signs Height(in): 71 Pulse(bpm): 64 Weight(lbs): 205 Blood Pressure(mmHg): 161/68 Body Mass Index(BMI): 29 Temperature(F): 97.8 Respiratory Rate(breaths/min): 18 Photos:  [N/A:N/A] Wound Location: Left, Lateral Foot Left Toe Great N/A Wounding Event: Pressure Injury Trauma N/A Primary Etiology: Diabetic Wound/Ulcer of the Lower Diabetic Wound/Ulcer of the Lower N/A Extremity Extremity Comorbid History: Cataracts, Middle ear problems, Cataracts, Middle ear problems, N/A Sleep Apnea, Hypertension, Type II Sleep Apnea, Hypertension, Type II Diabetes, Gout, Osteoarthritis, Diabetes, Gout, Osteoarthritis, Neuropathy Neuropathy Date Acquired: 01/19/2019 09/09/2019 N/A Weeks of Treatment: 30 0 N/A Wound Status: Open Open N/A Measurements L x W x D (cm) 0.4x2.1x0.3 0.5x0.7x0.1 N/A Area (cm) : 0.66 0.275 N/A Volume (cm) : 0.198 0.027 N/A % Reduction in Area: -140.00% N/A N/A % Reduction in Volume: -141.50% N/A N/A Classification: Grade  2 Grade 1 N/A Exudate Amount: Medium Medium N/A Exudate Type: Serosanguineous Serous N/A Exudate Color: red, brown amber N/A Wound Margin: Flat and Intact Flat and Intact N/A Granulation Amount: Large (67-100%) Large (67-100%) N/A Granulation Quality: Pink Pink N/A Necrotic Amount: None Present (0%) None Present (0%) N/A Exposed Structures: Fat Layer (Subcutaneous Tissue) Fat Layer (Subcutaneous Tissue) N/A Exposed: Yes Exposed: Yes Fascia: No Fascia: No Tendon: No Tendon: No Muscle: No Muscle: No Joint: No Joint: No Bone: No Bone: No Epithelialization: Small (1-33%) Medium (34-66%) N/A Treatment Notes Electronic Signature(s) Signed: 09/11/2019 4:28:28 PM By: Montey Hora Entered By: Montey Hora on 09/11/2019 13:18:28 [name] ([id number]) -------------------------------------------------------------------------------- Multi-Disciplinary Care Plan Details Patient Name: [name] Date of Service: 09/11/2019 12:45 PM Medical Record Number: [id number] Patient Account Number: [id number] Date of Birth/Sex: 10-05-1945 (73 y.o. M) Treating RN: Montey Hora Primary Care Wanona Stare: [name] Other  Clinician: Referring Harish Bram: [name] Treating  [place]/Extender: Melburn Hake, HOYT Weeks in Treatment: 30 Active Inactive Pressure Nursing Diagnoses: Knowledge deficit related to management of pressures ulcers Goals: Patient will remain free from development of additional pressure ulcers Date Initiated: 04/30/2019 Target Resolution Date: 05/14/2019 Goal Status: Active Patient/caregiver will verbalize understanding of pressure ulcer management Date Initiated: 04/30/2019 Target Resolution Date: 05/14/2019 Goal Status: Active Interventions: Assess: immobility, friction, shearing, incontinence upon admission and as needed Treatment Activities: Patient referred for pressure reduction/relief devices : 04/30/2019 Notes: Wound/Skin Impairment Nursing Diagnoses: Impaired tissue integrity Goals: Ulcer/skin breakdown will have a volume reduction of 30% by week 4 Date Initiated: 04/30/2019 Target Resolution Date: 05/31/2019 Goal Status: Active Interventions: Assess patient/caregiver ability to obtain necessary supplies Assess ulceration(s) every visit Treatment Activities: Referred to DME Samanthia Howland for dressing supplies : 04/30/2019 Skin care regimen initiated : 04/30/2019 Topical wound management initiated : 04/30/2019 Notes: Electronic Signature(s) Signed: 09/11/2019 4:28:28 PM By: Montey Hora Entered By: Montey Hora on 09/11/2019 13:18:20 [name] ([id number]) -------------------------------------------------------------------------------- Pain Assessment Details Patient Name: [name] Date of Service: 09/11/2019 12:45 PM Medical Record Number: [id number] Patient Account Number: [id number] Date of Birth/Sex: 10/15/1945 (73 y.o. M) Treating RN: Montey Hora Primary Care Elisavet Buehrer: [name] Other Clinician: Referring Yumi Insalaco: [name] Treating Derwin Reddy/Extender: Melburn Hake, HOYT Weeks in Treatment: 30 Active Problems Location of Pain Severity  and Description of Pain Patient Has Paino No Site Locations Pain Management and Medication Current Pain Management: Electronic Signature(s) Signed: 09/11/2019 4:28:28 PM By: Montey Hora Entered By: Montey Hora on 09/11/2019 12:55:23 [name] ([id number]) -------------------------------------------------------------------------------- Wound Assessment Details Patient Name: [name] Date of Service: 09/11/2019 12:45 PM Medical Record Number: [id number] Patient Account Number: [id number] Date of Birth/Sex: 1946/01/13 (73 y.o. M) Treating RN: Montey Hora Primary Care Derrall Hicks: [name] Other Clinician: Referring Janney Priego: [name] Treating Alexcia Schools/Extender: [name], HOYT Weeks in Treatment: 30 Wound Status Wound Number: 5 Primary Diabetic Wound/Ulcer of the Lower Extremity Etiology: Wound Location: Left, Lateral Foot Wound Open Wounding Event: Pressure Injury Status: Date Acquired: 01/19/2019 Comorbid Cataracts, Middle ear problems, Sleep Apnea, Weeks Of Treatment: 30 History: Hypertension, Type II Diabetes, Gout, Osteoarthritis, Clustered Wound: No Neuropathy Photos Wound Measurements Length: (cm) 0.4 % Redu Width: (cm) 2.1 % Redu Depth: (cm) 0.3 Epithe Area: (cm) 0.66 [place] Volume: (cm) 0.198 Under ction in Area: -140% ction in Volume: -141.5% lialization: Small (1-33%) ling: No mining: No Wound Description Classification: Grade 2 Foul O Wound Margin: Flat and Intact Slough Exudate Amount: Medium Exudate Type: Serosanguineous Exudate Color: red, brown dor After Cleansing: No /Fibrino No  Wound Bed Granulation Amount: Large (67-100%) Exposed Structure Granulation Quality: Pink Fascia Exposed: No Necrotic Amount: None Present (0%) Fat Layer (Subcutaneous Tissue) Exposed: Yes Tendon Exposed: No Muscle Exposed: No Joint Exposed: No Bone Exposed: No Treatment Notes Wound #5 (Left, Lateral  Foot) Notes prisma,BFD Electronic Signature(s) JAQUEZ, FARRINGTON ([id number]) Signed: 09/11/2019 4:28:28 PM By: Montey Hora Entered By: Montey Hora on 09/11/2019 12:58:26 [name] ([id number]) -------------------------------------------------------------------------------- Wound Assessment Details Patient Name: [name] Date of Service: 09/11/2019 12:45 PM Medical Record Number: [id number] Patient Account Number: [id number] Date of Birth/Sex: [birth year] (73 y.o. M) Treating RN: Montey Hora Primary Care Antawn Sison: [name] Other Clinician: Referring Stefanos Haynesworth: [name] Treating Vincent Streater/Extender: [name], HOYT Weeks in Treatment: 30 Wound Status Wound Number: 6 Primary Diabetic Wound/Ulcer of the Lower Extremity Etiology: Wound Location: Left Toe Great Wound Open Wounding Event: Trauma Status: Date Acquired: 09/09/2019 Comorbid Cataracts, Middle ear problems, Sleep Apnea, Weeks Of Treatment: 0 History: Hypertension, Type II Diabetes, Gout, Osteoarthritis, Clustered Wound: No Neuropathy Photos Wound Measurements Length: (cm) 0.5 % R Width: (cm) 0.7 % R Depth: (cm) 0.1 Epi Area: (cm) 0.275 Tu Volume: (cm) 0.027 Un eduction in Area: eduction in Volume: thelialization: Medium (34-66%) nneling: No dermining: No Wound Description Classification: Grade 1 Fou Wound Margin: Flat and Intact Slo Exudate Amount: Medium Exudate Type: Serous Exudate Color: amber l Odor After Cleansing: No ugh/Fibrino No Wound Bed Granulation Amount: Large (67-100%) Exposed Structure Granulation Quality: Pink Fascia Exposed: No Necrotic Amount: None Present (0%) Fat Layer (Subcutaneous Tissue) Exposed: Yes Tendon Exposed: No Muscle Exposed: No Joint Exposed: No Bone Exposed: No Treatment Notes Wound #6 (Left Toe Great) Notes prisma,BFD Electronic Signature(s) ZYMERE, PATLAN ([id number]) Signed: 09/11/2019 4:28:28 PM By: Montey Hora Entered  By: Montey Hora on 09/11/2019 13:00:40 [name] ([id number]) -------------------------------------------------------------------------------- [place] Details Patient Name: [name] Date of Service: 09/11/2019 12:45 PM Medical Record Number: [id number] Patient Account Number: [id number] Date of Birth/Sex: 05-05-1945 (73 y.o. M) Treating RN: Montey Hora Primary Care Bennye Nix: [name] Other Clinician: Referring Itzayana Pardy: [name] Treating Pat Sires/Extender: Melburn Hake, HOYT Weeks in Treatment: 30 Vital Signs Time Taken: 12:50 Temperature (F): 97.8 Height (in): 71 Pulse (bpm): 83 Weight (lbs): 205 Respiratory Rate (breaths/min): 18 Body Mass Index (BMI): 28.6 Blood Pressure (mmHg): 161/68 Reference Range: 80 - 120 mg / dl Electronic Signature(s) Signed: 09/11/2019 4:34:14 PM By: Lorine Bears RCP, RRT, CHT Entered By: Lorine Bears on 09/11/2019 12:52:58

## 2019-09-11 NOTE — Progress Notes (Addendum)
ROXAS, CLYMER ([id number]) Visit Report for 09/11/2019 Chief Complaint Document Details Patient Name: [name], [name] Date of Service: 09/11/2019 12:45 PM Medical Record Number: [id number] Patient Account Number: [id number] Date of Birth/Sex: Sep 25, 1945 (74 y.o. M) Treating RN: Montey Hora Primary Care Provider: [name] Other Clinician: Referring Provider: [name] Treating Provider/Extender: Melburn Hake, Gladyes Kudo Weeks in Treatment: 30 Information Obtained from: Patient Chief Complaint Left dorsal foot ulcer 02/09/2019; patient is here for review of the wound on the left lateral foot roughly at the level of the base of the fifth metatarsal Electronic Signature(s) Signed: 09/11/2019 1:03:06 PM By: Worthy Keeler [name] Entered By: Worthy Keeler on 09/11/2019 13:03:05 [name] ([id number]) -------------------------------------------------------------------------------- Debridement Details Patient Name: [name] Date of Service: 09/11/2019 12:45 PM Medical Record Number: [id number] Patient Account Number: [id number] Date of Birth/Sex: [birth year] (73 y.o. M) Treating RN: Montey Hora Primary Care Provider: [name] Other Clinician: Referring Provider: [name] Treating Provider/Extender: Melburn Hake, Lindwood Mogel Weeks in Treatment: 30 Debridement Performed for Wound #5 Left,Lateral Foot Assessment: Performed By: Physician [name], Denisia Harpole E., [name] Debridement Type: Debridement Severity of Tissue Pre Debridement: Fat layer exposed Level of Consciousness (Pre- Awake and Alert procedure): Pre-procedure Verification/Time Out Yes - 13:18 Taken: Start Time: 13:18 Pain Control: Lidocaine 4% Topical Solution Total Area Debrided (L x W): 0.4 (cm) x 2.1 (cm) = 0.84 (cm) Tissue and other material Viable, Non-Viable, Callus, Slough, Subcutaneous, Skin: Dermis , Skin: Epidermis, Slough debrided: Level: Skin/Subcutaneous Tissue Debridement  Description: Excisional Instrument: Curette Specimen: Swab, Number of Specimens Taken: 1 Bleeding: Minimum Hemostasis Achieved: Pressure End Time: 13:23 Procedural Pain: 0 Post Procedural Pain: 0 Response to Treatment: Procedure was tolerated well Level of Consciousness (Post- Awake and Alert procedure): Post Debridement Measurements of Total Wound Length: (cm) 1.6 Width: (cm) 2.1 Depth: (cm) 0.3 Volume: (cm) 0.792 Character of Wound/Ulcer Post Debridement: Improved Severity of Tissue Post Debridement: Fat layer exposed Post Procedure Diagnosis Same as Pre-procedure Electronic Signature(s) Signed: 09/11/2019 3:51:15 PM By: Worthy Keeler [name] Signed: 09/11/2019 4:28:28 PM By: Montey Hora Entered By: Montey Hora on 09/11/2019 13:26:30 [name] ([id number]) -------------------------------------------------------------------------------- HPI Details Patient Name: [name] Date of Service: 09/11/2019 12:45 PM Medical Record Number: [id number] Patient Account Number: [id number] Date of Birth/Sex: [birth year] (73 y.o. M) Treating RN: Montey Hora Primary Care Provider: [name] Other Clinician: Referring Provider: [name] Treating Provider/Extender: Melburn Hake, Debbe Crumble Weeks in Treatment: 30 History of Present Illness HPI Description: 10/04/16 on evaluation today patient presents for initial evaluation concerning a laceration of the right wrist and hand on the bowler aspect. He tells me that he fell into glass following a medication change in regard to his diabetes. He initially went to [facility] where she was sutured and subsequently his daughter who is a Marine scientist removed the teachers at the appropriate time. Unfortunately the wound has done poorly and dehissed which has caused him some trouble including infection. He has been on antibiotics both topically and orally though the wound continues to show signs of necrotic tissue according to notes  which is what he was sent to [place] for evaluation concerning. He does have diabetes and unfortunately this is severely uncontrolled. He does see an endocrinologist and is working on this. 10/11/2016 -- the patient's notes from the ER were reviewed and he had his suturing done on 09/17/2016 and had necrotic skin flaps which had to be trimmed the last visit he was here. He has managed to get  his Santyl ointment and has been using this appropriately. 10/21/16 patient's wound appears to be doing very well on evaluation today. He still has some Slough covering the wound bed but this is nowhere near as severe as when i saw this initially two weeks ago nor even my review of his pictures last week. I'm pleased with how this is progressing. Patient's wound over the right form appears to be doing fairly well on evaluation today. There is no evidence of infection and he has a small area at the crease of where she is wrist extends and flexes that is still open and appears to be having difficulty due to the fact that it is cracking open. I think this is something that may be controlled with moisture control that is keeping the wound a little bit more moist. Nonetheless this is a tiny region compared to the initial wound and overall appears to be doing very well. No fevers, chills, nausea, or vomiting noted at this time. 11/25/16 on evaluation today patient appears to be doing well in regard to his right wrist ulcer. In fact this appears to be completely healed he is having no discomfort. He is pleased with how this is progressed. Readmission: 06/24/17 on evaluation today patient appears back in our clinic regarding issues that he has been having with his right ankle on the lateral malleolus as well is in the dorsal surface of his left foot. Both have been going on for several weeks unfortunately. He tells me that initially this was being managed by Dr. Edilia Bo and I did review the notes from Dr. Edilia Bo in epic.  Patient was placed on amoxicillin initially and then subsequently Keflex following. He has been using triple antibiotic ointment over-the-counter along with a Band-Aid for the wound currently. He did not have any Santyl remaining. His most recent hemoglobin A1c which was just two days ago was 10.3. Fortunately he seems to have excellent blood flow in the bilateral lower extremities with his left ABI being 0.95 in the right ABI 1.01. Patient does have some pain in regard to each of these ulcerations but he tells me it's not nearly as severe as the hand wound that I help treat earlier over the summer. This was in 2018. 07/15/17 on evaluation today patient's lower surety ulcer is actually both appear to be doing fairly well at this point. He does have a little bit of skin overlapping the ulcer on the right lateral ankle this was debrided away today just with saline and gauze without complication patient had no discomfort or pain. Otherwise patient's left foot ulcer does appear to be showing signs of improvement he does seem to have a right great toe. Nikki and noted at this point in time. I think that this does need to be managed at this point. No fevers, chills, nausea, or vomiting noted at this time. 07/22/17 on evaluation today patient's wounds in regard to his lower extremities appear to be doing somewhat better. His right ankle wound in fact appears to likely be completely closed. The left dorsal foot ulcer though still open does appear to be a little bit smaller and does also seem to be making good progress. He has not been having problems with the treatment at this point in the general he does not seem to show as much erythema surrounding the wound bed which is good news. He is not having significant discomfort. 08/12/17 on evaluation today patients wounds of both appeared to be doing better in fact  they both appear to be completely healed. Overall I'm very pleased with how things have progressed over  the past week even more than what I expected. Obviously this is good news and he is happy about this. He did have a fall when going to his orthopedic doctor recently fortunately he does not appear to have injured anything severely but nonetheless does have a couple of bumps and bruises nothing significant at this time but he needs to be seen and wound care for. He may have broken his left wrist he is following up with orthopedics in that regard. Readmission: 10/24/17 on evaluation today patient presents for reevaluation after having been discharged Aug 12, 2017. His wound was healed at that point although he states that it has been somewhat discolored since that time. He has not experienced the drainage as far as the wound is concerned but the fact that it was still discolored been greater than two months after the fact made him want to come in for reevaluation. Upon inspection today the patient has no pain although he does have neuropathy. He does have a discolored region of the dorsal surface of the left foot which was the site where the ulcer was that we previously treated. The good news is he does not again have any discomfort although obviously I do believe this may be some scar tissue and just discoloration in that regard. Again he hasn't had any drainage or discharge since I last saw him and at this point I see no evidence of that either this area shows complete epithelialization. In general I do not feel like there's any significant issue at this point in that regard. 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 He has been in this clinic before in 2018 with a laceration on his right hand and then again in 2019 with an area over the right lateral malleolus. 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 He states that the wound is been  there for the last week or [name], [name]. ([id number]) 2. It is been uncomfortable to walk on. The man is an active man having to look after an elderly mother. Past medical history, type 2 diabetes with polyneuropathy with a recent hemoglobin A1c in September 2011 0.3, lumbar disc disease, hypergonadism., Left foot drop and gout. We did not repeat his ABIs in the clinic quoting values from July 2019 of 0.95 and 1.01. He is not felt to have significant PAD 02/19/2019 on evaluation today patient actually appears to be doing better with regard to his foot wound based on what I am seeing currently. He was seen by Dr. Dellia Nims on readmission back into the clinic last week when I was off. The good news is the patient has been tolerating the dressing changes without any complication in fact has not really had much in the way of drainage. He does have an AFO brace which she believes is actually pushing on this area as well and has contributed to the issue. Fortunately there is no signs of active infection at this point. His ABIs appear to be well. 03/05/2019 on evaluation today patient appears to be doing well in regard to his foot ulcer. He has been tolerating the dressing changes without complication. Fortunately there is no signs of active infection. The wound is measuring somewhat smaller and overall I feel  like he is making good progress at this point. 03/12/2019 on evaluation today patient actually appears to be doing quite well with regard to his foot ulcer. The wound is looking better the measurement is not significantly better at this point unfortunately but nonetheless again the overall appearance is improving I think he is making some progress. I still think the big issue here is foot drop and again the patient wants to see if potentially he could get a brace to help with this. Subsequently I think that we can definitely make a referral to [facility] to see if they can help [place] in this regard. He  is in agreement with that plan. 03/26/2019 on evaluation today patient appears to be doing more poorly in regard to his foot ulcer at this time. He did see podiatry in order to see about getting a brace but unfortunately he tells me that Medicare is not likely to pay for one for him as he had 1 I guess 8 months ago. However this one that apparently goes in his shoes that does not seem to be working out well for him. Unfortunately there are signs of active infection at this time. No fevers, chills, nausea, vomiting, or diarrhea. Systemically. The patient has erythema on the dorsal surface of his foot that tracks from the lateral portion of his foot and again he does have a blister around the wound as well which is worse compared to previous. Overall I am concerned. 04/02/2019 on evaluation today patient appears to be doing about the same with regard to his wound. Unfortunately there is no signs of significant improvement although I do think there is no signs of infection which is good news. His drainage is also slowed down tremendously. I think at this point he would benefit from the initiation of a total contact cast. The only concern I have is that over his balance. I explained to the patient that if we were to initiate the cast I think it would be greatly beneficial for him but he would need to ensure not to walk at all unless he uses a walker he actually has 2 walkers one for inside his home and one that he keeps out in the car. For that reason he would be covered in that regard. I just do not want him to have any accidents and fall with the cast. 12/28-Patient comes in 1 week out a day early for his appointment he was in the hospital for 2 days for uncontrolled hyperglycemia and required management of high blood sugars, with some adjustments in his home regimen of insulin, patient stubbed his right great toe today and put a Band-Aid on it, he is here for his left lateral foot ulcer and we are  using silver alginate dressing, he was intact total contact cast with difficulty with balance to the extent that that had to be discontinued 04/16/2019 on evaluation today patient appears to be doing better with regard to the overall appearance of his wound size. This is good news. We had attempted a total contact cast but unfortunately he had some falling issues that the family contacted [place] about we took the cast off we did not put it back on. Nonetheless it ended up that just a couple days later he was in the hospital due to elevations in his blood sugar which went to around 500 and even a little higher. Subsequently he was admitted to the hospital and was in the hospital for a total of 3  days and that included Christmas day. Fortunately there got his blood sugar under control he tells me that this is running about 200 now which is much better news. There does not appear to be any signs of active infection at this time. No fevers, chills, nausea, vomiting, or diarrhea. 04/30/2019 on evaluation today patient actually appears to be doing slightly worse compared to previous as far as the overall size of his wound is concerned. There also is some need for sharp debridement today as well. Fortunately there is no evidence of active infection which is good news. No fevers, chills, nausea, vomiting, or diarrhea. 05/07/2019 upon evaluation today patient appears to be doing excellent in regard to his plantar foot ulcer. He has been tolerating the dressing changes without complication. Fortunately there is no signs of active infection at this time. No fevers, chills, nausea, vomiting, or diarrhea. He does tell me that he actually got in touch with [employer] and they feel like they can add diagnosis codes to his orders in order to get an external brace for him as opposed to the one that goes internal to issue. That would be excellent for his foot drop and I did tell him that if there is anything I need to fill out in  that regard I will be more than happy to oblige and fill out what is needed for [employer]. 05/14/2019 upon evaluation today patient's original wound site actually appears to be doing much better which is good news. Fortunately there is no signs of active infection at this time. No fevers, chills, nausea, vomiting, or diarrhea. With that being said the patient unfortunately is continuing to have issues with rubbing he tells me that the felt slid on his foot although he did not realize it until today. Nonetheless I think this did cause a blister around the edge of the wound and he does an open wound that is slightly larger overall in measurement compared to last time mainly due to this blister. Other than that I feel like he is doing quite well. 05/21/2019 upon evaluation today patient appears to be doing excellent in regard to his wound at this time on the plantar foot. He is showing signs of improvement he does have some slough and biofilm noted on the surface of the wound this is going require some sharp debridement today as well. Fortunately there is no evidence of active infection. No fevers, chills, nausea, vomiting, or diarrhea. 05/28/2019 on evaluation today patient's wound actually appears to be measuring smaller upon initial inspection and I do believe that it is doing well. With that being said he continues to have a blister on the outside/lateral edge of the wound that occurs as a result of rubbing due to his dropfoot currently. Subsequently we have filled out the paperwork for his new external brace but again that does take several weeks to get that completed once everything is returned to back we did send this back last week as far as the paperwork was concerned. With that being said there does not appear to be any signs of active infection at this time. 06/04/2019 on evaluation today patient's wound actually appears to be doing slightly better compared to last week which is good news. He  still states that his insurance unfortunately does not seem to want to cover any of his new brace. He is going to just pay for this himself. Nonetheless he is going by there today when he leaves here that is [employer] in [place] that  is going to be making this brace for him. 06/18/2019 upon evaluation today patient fortunately does have his new brace which is the external AFO brace with bilateral uprights. Subsequently he seems to be doing much better in fact the wound already appears better. He has not even been using the felt he is just using a dressing over [place] ([id number]) top of the wound and overall I am very pleased with how things seem to be progressing. There is no signs of active infection at this time which is great news and I think that the AFO is good to help him tremendously as far as trying to keep pressure off of the 06/25/2019 upon evaluation today patient seems to be making good progress in regard to his wound. He has been tolerating the dressing changes without complication. Fortunately there is no signs of active infection at this time. No fevers, chills, nausea, vomiting, or diarrhea. 07/02/2019 upon evaluation today patient unfortunately had a blistered area on the bottom of his foot tracking more towards the midline of the plantar aspect. He does not seem to have issues with rubbing more laterally that he used to have but nonetheless this is still giving him some trouble and causing problems here. Fortunately there is no signs of active infection at this time. No fevers, chills, nausea, vomiting, or diarrhea. 07/09/2019 upon evaluation today patient appears to be doing well with regard to his plantar foot ulcer. Definitely this is much improved compared to last week's visit which again last week the issue there was that he had an area of callus buildup on the plantar aspect of his foot I was able to clean this away and after effectively doing so the wound seems to be  doing much better which is great news. There is no signs of active infection at this time. 07/16/2019 upon evaluation today patient appears to be making some progress in my opinion with regard to his plantar foot ulcer. He has been tolerating the dressing changes without complication. Fortunately there is no evidence of active infection at this time which is great news. 07/23/2019 upon evaluation today patient appears to be doing well with regard to his wounds on the plantar foot. He has been tolerating the dressing changes without complication. These do seem to be healing. 08/06/2019 upon evaluation today patient's wound actually appears to be showing some signs of improvement though he still has a significant amount of callus buildup over this location. That is can require some debridement today. With that being said the actual wound openings seem to be dramatically improved which is great news. There is no signs of active infection at this time. No fevers, chills, nausea, vomiting, or diarrhea. 08/13/2019 upon evaluation today patient appears to be doing well with regard to his foot ulcer. He has been tolerating the dressing changes without complication. Fortunately there is no signs of active infection at this time. No fevers, chills, nausea, vomiting, or diarrhea. 08/20/2019 upon evaluation today patient appears to be doing excellent in regard to his foot ulcer. I do believe he still showing signs of improvement this is very slow but again I think still pressure getting to the area even with his brace and custom shoe is still part of the main issue with the delay here. Nonetheless I do not see any signs of worsening or infection all of which is good news. 5/20; left lateral foot wound. He wears an AFO brace for foot drop related to his diabetes. The wound  is small and clean looking he is using silver collagen. We have apparently tried him in offloading shoe he could not tolerate this. He does not have  good balance. 09/11/2019 upon evaluation today patient appears to be doing more poorly in regard to his foot ulcer as compared to last time I saw him. Unfortunately he does tell me as we discussed on Friday that he had pus and blood coming from the area underneath the callus. Unfortunately I think that this is a combination of the callus not being trimmed away enough and potentially the felt pad causing an abnormal rubbing which has happened before which led to this becoming more irritated and likely infected based on what I am seeing today. The patient takes ampicillin all the time. Subsequently he also states that he has been trying for a couple of days to take the clindamycin of which she had some leftover from previous. With that being said it upset his stomach. The antibiotic that I sent in for him on Friday unfortunately I wrote the wrong number on as far as the number of pills that he needed. With that being said I corrected that this morning but he tells me is not can pick it up as that seems to be making him have trouble with his stomach being upset at this point. Fortunately there is no signs of active infection at this time systemically. Electronic Signature(s) Signed: 09/11/2019 1:28:10 PM By: Worthy Keeler [name] Entered By: Worthy Keeler on 09/11/2019 13:28:09 [name] ([id number]) -------------------------------------------------------------------------------- Physical Exam Details Patient Name: [name] Date of Service: 09/11/2019 12:45 PM Medical Record Number: [id number] Patient Account Number: [id number] Date of Birth/Sex: [birth year] (73 y.o. M) Treating RN: Montey Hora Primary Care Provider: [name] Other Clinician: Referring Provider: [name] Treating Provider/Extender: [name], Dvon Jiles Weeks in Treatment: 63 Constitutional Well-nourished and well-hydrated in no acute distress. Respiratory normal breathing without  difficulty. Psychiatric this patient is able to make decisions and demonstrates good insight into disease process. Alert and Oriented x 3. pleasant and cooperative. Notes His wound unfortunately had a significant amount of callus that needed to be cleaned away. This was warm to touch and does show signs of infection. He is currently on ampicillin which he takes on a regular basis. With that being said I am and I did obtain a culture today which was done post debridement once the callus was cleaned away and we will see what this shows. Depending on the results of the culture I will initiate antibiotic treatment as necessary. Electronic Signature(s) Signed: 09/11/2019 1:28:34 PM By: Worthy Keeler [name] Entered By: Worthy Keeler on 09/11/2019 13:28:34 [name] ([id number]) -------------------------------------------------------------------------------- Physician Orders Details Patient Name: [name] Date of Service: 09/11/2019 12:45 PM Medical Record Number: [id number] Patient Account Number: [id number] Date of Birth/Sex: 07/10/1945 (73 y.o. 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 Wound #6 Left Toe Great o May shower with protection. Anesthetic (add to Medication List) Wound #5 Left,Lateral Foot o Topical Lidocaine 4% cream applied to wound bed  prior to debridement (In Clinic Only). Wound #6 Left Toe Great o Topical Lidocaine 4% cream applied to  wound bed prior to debridement (In Clinic Only). Primary Wound Dressing Wound #5 Left,Lateral Foot o Silver Collagen Wound #6 Left Toe Great o Silver Collagen Secondary Dressing Wound #5 Left,Lateral Foot o Boardered Foam Dressing Wound #6 Left Toe Great o Boardered Foam Dressing Dressing Change Frequency Wound #5 Left,Lateral Foot o Change dressing every other day. Wound #6 Left Toe Great o Change dressing every other day. Follow-up Appointments o Return Appointment in 1 week. Edema Control Wound #5 Left,Lateral Foot o Elevate legs to the level of the heart and pump ankles as often as possible Additional Orders / Instructions Wound #5 Left,Lateral Foot [name], [name]. ([id number]) o Other: - Patient to follow-up with [facility] for adjustments to braces and shoes. Requested paperwork sent to [facility]. Laboratory o Bacteria identified in Wound by Culture (MICRO) oooo LOINC Code: [id number] oooo Convenience Name: Wound culture routine Electronic Signature(s) Signed: 09/11/2019 3:51:15 PM By: Worthy Keeler [name] Signed: 09/11/2019 4:28:28 PM By: Montey Hora Entered By: Montey Hora on 09/11/2019 13:32:05 [name] ([id number]) -------------------------------------------------------------------------------- Problem List Details Patient Name: [name] Date of Service: 09/11/2019 12:45 PM Medical Record Number: [id number] Patient Account Number: [id number] Date of Birth/Sex: [birth year] (73 y.o. M) Treating RN: Montey Hora Primary Care Provider: [name] Other Clinician: Referring Provider: [name] Treating Provider/Extender: Melburn Hake, Zynia Wojtowicz Weeks in Treatment: 30 Active Problems ICD-10 Encounter Code Description Active Date MDM Diagnosis E11.621 Type 2 diabetes mellitus with foot ulcer 02/09/2019 No Yes E11.42 Type 2 diabetes mellitus with diabetic polyneuropathy 02/09/2019 No Yes L97.522 Non-pressure chronic ulcer of  other part of left foot with fat layer 02/09/2019 No Yes exposed M21.372 Foot drop, left foot 02/09/2019 No Yes Inactive Problems Resolved Problems Electronic Signature(s) Signed: 09/11/2019 1:02:51 PM By: Worthy Keeler [name] Entered By: Worthy Keeler on 09/11/2019 13:02:50 [name] ([id number]) -------------------------------------------------------------------------------- Progress Note Details Patient Name: [name] Date of Service: 09/11/2019 12:45 PM Medical Record Number: [id number] Patient Account Number: [id number] Date of Birth/Sex: 1945/11/24 (73 y.o. M) Treating RN: Montey Hora Primary Care Provider: [name] Other Clinician: Referring Provider: [name] Treating Provider/Extender: Melburn Hake, Lauralyn Shadowens Weeks in Treatment: 30 Subjective Chief Complaint Information obtained from Patient Left dorsal foot ulcer 02/09/2019; patient is here for review of the wound on the left lateral foot roughly at the level of the base of the fifth metatarsal History of Present Illness (HPI) 10/04/16 on evaluation today patient presents for initial evaluation concerning a laceration of the right wrist and hand on the bowler aspect. He tells me that he fell into glass following a medication change in regard to his diabetes. He initially went to [facility] where she was sutured and subsequently his daughter who is a Marine scientist removed the teachers at the appropriate time. Unfortunately the wound has done poorly and dehissed which has caused him some trouble including infection. He has been on antibiotics both topically and orally though the wound continues to show signs of necrotic tissue according to notes which is what he was sent to [place] for evaluation concerning. He does have diabetes and unfortunately this is severely uncontrolled. He does see an endocrinologist and is working on this. 10/11/2016 -- the patient's notes from the ER were reviewed and he had his suturing  done on 09/17/2016 and had necrotic skin flaps which had to be trimmed the last visit he was here.  He has managed to get his Santyl ointment and has been using this appropriately. 10/21/16 patient's wound appears to be doing very well on evaluation today. He still has some Slough covering the wound bed but this is nowhere near as severe as when i saw this initially two weeks ago nor even my review of his pictures last week. I'm pleased with how this is progressing. Patient's wound over the right form appears to be doing fairly well on evaluation today. There is no evidence of infection and he has a small area at the crease of where she is wrist extends and flexes that is still open and appears to be having difficulty due to the fact that it is cracking open. I think this is something that may be controlled with moisture control that is keeping the wound a little bit more moist. Nonetheless this is a tiny region compared to the initial wound and overall appears to be doing very well. No fevers, chills, nausea, or vomiting noted at this time. 11/25/16 on evaluation today patient appears to be doing well in regard to his right wrist ulcer. In fact this appears to be completely healed he is having no discomfort. He is pleased with how this is progressed. Readmission: 06/24/17 on evaluation today patient appears back in our clinic regarding issues that he has been having with his right ankle on the lateral malleolus as well is in the dorsal surface of his left foot. Both have been going on for several weeks unfortunately. He tells me that initially this was being managed by Dr. Edilia Bo and I did review the notes from Dr. Edilia Bo in epic. Patient was placed on amoxicillin initially and then subsequently Keflex following. He has been using triple antibiotic ointment over-the-counter along with a Band-Aid for the wound currently. He did not have any Santyl remaining. His most recent hemoglobin A1c which was  just two days ago was 10.3. Fortunately he seems to have excellent blood flow in the bilateral lower extremities with his left ABI being 0.95 in the right ABI 1.01. Patient does have some pain in regard to each of these ulcerations but he tells me it's not nearly as severe as the hand wound that I help treat earlier over the summer. This was in 2018. 07/15/17 on evaluation today patient's lower surety ulcer is actually both appear to be doing fairly well at this point. He does have a little bit of skin overlapping the ulcer on the right lateral ankle this was debrided away today just with saline and gauze without complication patient had no discomfort or pain. Otherwise patient's left foot ulcer does appear to be showing signs of improvement he does seem to have a right great toe. Nikki and noted at this point in time. I think that this does need to be managed at this point. No fevers, chills, nausea, or vomiting noted at this time. 07/22/17 on evaluation today patient's wounds in regard to his lower extremities appear to be doing somewhat better. His right ankle wound in fact appears to likely be completely closed. The left dorsal foot ulcer though still open does appear to be a little bit smaller and does also seem to be making good progress. He has not been having problems with the treatment at this point in the general he does not seem to show as much erythema surrounding the wound bed which is good news. He is not having significant discomfort. 08/12/17 on evaluation today patients wounds of both appeared to  be doing better in fact they both appear to be completely healed. Overall I'm very pleased with how things have progressed over the past week even more than what I expected. Obviously this is good news and he is happy about this. He did have a fall when going to his orthopedic doctor recently fortunately he does not appear to have injured anything severely but nonetheless does have a couple of  bumps and bruises nothing significant at this time but he needs to be seen and wound care for. He may have broken his left wrist he is following up with orthopedics in that regard. Readmission: 10/24/17 on evaluation today patient presents for reevaluation after having been discharged Aug 12, 2017. His wound was healed at that point although he states that it has been somewhat discolored since that time. He has not experienced the drainage as far as the wound is concerned but the fact that it was still discolored been greater than two months after the fact made him want to come in for reevaluation. Upon inspection today the patient has no pain although he does have neuropathy. He does have a discolored region of the dorsal surface of the left foot which was the site where the ulcer was that we previously treated. The good news is he does not again have any discomfort although obviously I do believe this may be some scar tissue and just discoloration in that regard. Again he hasn't had any drainage or discharge since I last saw him and at this point I see no evidence of that either this area shows complete epithelialization. In general I do not feel like there's any significant issue at this point in that regard. [name], [name] ([id number]) READMISSION 02/09/2019 This is a 74 year old man with type 2 diabetes poorly controlled with significant peripheral neuropathy. He has been in this clinic before in 2018 with a laceration on his right hand and then again in 2019 with an area over the right lateral malleolus. 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 He states that the wound is been there for the last week or 2. It is been uncomfortable to walk on. The man is an active man having to look after an elderly mother. Past medical history, type 2 diabetes with polyneuropathy with a recent hemoglobin A1c in September 2011  0.3, lumbar disc disease, hypergonadism., Left foot drop and gout. We did not repeat his ABIs in the clinic quoting values from July 2019 of 0.95 and 1.01. He is not felt to have significant PAD 02/19/2019 on evaluation today patient actually appears to be doing better with regard to his foot wound based on what I am seeing currently. He was seen by Dr. Dellia Nims on readmission back into the clinic last week when I was off. The good news is the patient has been tolerating the dressing changes without any complication in fact has not really had much in the way of drainage. He does have an AFO brace which she believes is actually pushing on this area as well and has contributed to the issue. Fortunately there is no signs of active infection at this point. His ABIs appear to be well. 03/05/2019 on evaluation today patient appears to be doing well in regard to his foot ulcer. He has been tolerating the dressing changes without complication. Fortunately there is no signs of active infection. The wound is measuring somewhat  smaller and overall I feel like he is making good progress at this point. 03/12/2019 on evaluation today patient actually appears to be doing quite well with regard to his foot ulcer. The wound is looking better the measurement is not significantly better at this point unfortunately but nonetheless again the overall appearance is improving I think he is making some progress. I still think the big issue here is foot drop and again the patient wants to see if potentially he could get a brace to help with this. Subsequently I think that we can definitely make a referral to [facility] to see if they can help [place] in this regard. He is in agreement with that plan. 03/26/2019 on evaluation today patient appears to be doing more poorly in regard to his foot ulcer at this time. He did see podiatry in order to see about getting a brace but unfortunately he tells me that Medicare is not likely to pay  for one for him as he had 1 I guess 8 months ago. However this one that apparently goes in his shoes that does not seem to be working out well for him. Unfortunately there are signs of active infection at this time. No fevers, chills, nausea, vomiting, or diarrhea. Systemically. The patient has erythema on the dorsal surface of his foot that tracks from the lateral portion of his foot and again he does have a blister around the wound as well which is worse compared to previous. Overall I am concerned. 04/02/2019 on evaluation today patient appears to be doing about the same with regard to his wound. Unfortunately there is no signs of significant improvement although I do think there is no signs of infection which is good news. His drainage is also slowed down tremendously. I think at this point he would benefit from the initiation of a total contact cast. The only concern I have is that over his balance. I explained to the patient that if we were to initiate the cast I think it would be greatly beneficial for him but he would need to ensure not to walk at all unless he uses a walker he actually has 2 walkers one for inside his home and one that he keeps out in the car. For that reason he would be covered in that regard. I just do not want him to have any accidents and fall with the cast. 12/28-Patient comes in 1 week out a day early for his appointment he was in the hospital for 2 days for uncontrolled hyperglycemia and required management of high blood sugars, with some adjustments in his home regimen of insulin, patient stubbed his right great toe today and put a Band-Aid on it, he is here for his left lateral foot ulcer and we are using silver alginate dressing, he was intact total contact cast with difficulty with balance to the extent that that had to be discontinued 04/16/2019 on evaluation today patient appears to be doing better with regard to the overall appearance of his wound size. This is  good news. We had attempted a total contact cast but unfortunately he had some falling issues that the family contacted [place] about we took the cast off we did not put it back on. Nonetheless it ended up that just a couple days later he was in the hospital due to elevations in his blood sugar which went to around 500 and even a little higher. Subsequently he was admitted to the hospital and was in the hospital  for a total of 3 days and that included Christmas day. Fortunately there got his blood sugar under control he tells me that this is running about 200 now which is much better news. There does not appear to be any signs of active infection at this time. No fevers, chills, nausea, vomiting, or diarrhea. 04/30/2019 on evaluation today patient actually appears to be doing slightly worse compared to previous as far as the overall size of his wound is concerned. There also is some need for sharp debridement today as well. Fortunately there is no evidence of active infection which is good news. No fevers, chills, nausea, vomiting, or diarrhea. 05/07/2019 upon evaluation today patient appears to be doing excellent in regard to his plantar foot ulcer. He has been tolerating the dressing changes without complication. Fortunately there is no signs of active infection at this time. No fevers, chills, nausea, vomiting, or diarrhea. He does tell me that he actually got in touch with [employer] and they feel like they can add diagnosis codes to his orders in order to get an external brace for him as opposed to the one that goes internal to issue. That would be excellent for his foot drop and I did tell him that if there is anything I need to fill out in that regard I will be more than happy to oblige and fill out what is needed for [facility]. 05/14/2019 upon evaluation today patient's original wound site actually appears to be doing much better which is good news. Fortunately there is no signs of active infection at this  time. No fevers, chills, nausea, vomiting, or diarrhea. With that being said the patient unfortunately is continuing to have issues with rubbing he tells me that the felt slid on his foot although he did not realize it until today. Nonetheless I think this did cause a blister around the edge of the wound and he does an open wound that is slightly larger overall in measurement compared to last time mainly due to this blister. Other than that I feel like he is doing quite well. 05/21/2019 upon evaluation today patient appears to be doing excellent in regard to his wound at this time on the plantar foot. He is showing signs of improvement he does have some slough and biofilm noted on the surface of the wound this is going require some sharp debridement today as well. Fortunately there is no evidence of active infection. No fevers, chills, nausea, vomiting, or diarrhea. 05/28/2019 on evaluation today patient's wound actually appears to be measuring smaller upon initial inspection and I do believe that it is doing well. With that being said he continues to have a blister on the outside/lateral edge of the wound that occurs as a result of rubbing due to his dropfoot currently. Subsequently we have filled out the paperwork for his new external brace but again that does take several weeks to get that completed once everything is returned to back we did send this back last week as far as the paperwork was concerned. With that being said there does not appear to be any signs of active infection at this time. [name], [name] ([id number]) 06/04/2019 on evaluation today patient's wound actually appears to be doing slightly better compared to last week which is good news. He still states that his insurance unfortunately does not seem to want to cover any of his new brace. He is going to just pay for this himself. Nonetheless he is going by there today when he  leaves here that is [employer] in [place] that is going to  be making this brace for him. 06/18/2019 upon evaluation today patient fortunately does have his new brace which is the external AFO brace with bilateral uprights. Subsequently he seems to be doing much better in fact the wound already appears better. He has not even been using the felt he is just using a dressing over top of the wound and overall I am very pleased with how things seem to be progressing. There is no signs of active infection at this time which is great news and I think that the AFO is good to help him tremendously as far as trying to keep pressure off of the 06/25/2019 upon evaluation today patient seems to be making good progress in regard to his wound. He has been tolerating the dressing changes without complication. Fortunately there is no signs of active infection at this time. No fevers, chills, nausea, vomiting, or diarrhea. 07/02/2019 upon evaluation today patient unfortunately had a blistered area on the bottom of his foot tracking more towards the midline of the plantar aspect. He does not seem to have issues with rubbing more laterally that he used to have but nonetheless this is still giving him some trouble and causing problems here. Fortunately there is no signs of active infection at this time. No fevers, chills, nausea, vomiting, or diarrhea. 07/09/2019 upon evaluation today patient appears to be doing well with regard to his plantar foot ulcer. Definitely this is much improved compared to last week's visit which again last week the issue there was that he had an area of callus buildup on the plantar aspect of his foot I was able to clean this away and after effectively doing so the wound seems to be doing much better which is great news. There is no signs of active infection at this time. 07/16/2019 upon evaluation today patient appears to be making some progress in my opinion with regard to his plantar foot ulcer. He has been tolerating the dressing changes without  complication. Fortunately there is no evidence of active infection at this time which is great news. 07/23/2019 upon evaluation today patient appears to be doing well with regard to his wounds on the plantar foot. He has been tolerating the dressing changes without complication. These do seem to be healing. 08/06/2019 upon evaluation today patient's wound actually appears to be showing some signs of improvement though he still has a significant amount of callus buildup over this location. That is can require some debridement today. With that being said the actual wound openings seem to be dramatically improved which is great news. There is no signs of active infection at this time. No fevers, chills, nausea, vomiting, or diarrhea. 08/13/2019 upon evaluation today patient appears to be doing well with regard to his foot ulcer. He has been tolerating the dressing changes without complication. Fortunately there is no signs of active infection at this time. No fevers, chills, nausea, vomiting, or diarrhea. 08/20/2019 upon evaluation today patient appears to be doing excellent in regard to his foot ulcer. I do believe he still showing signs of improvement this is very slow but again I think still pressure getting to the area even with his brace and custom shoe is still part of the main issue with the delay here. Nonetheless I do not see any signs of worsening or infection all of which is good news. 5/20; left lateral foot wound. He wears an AFO brace for foot drop related  to his diabetes. The wound is small and clean looking he is using silver collagen. We have apparently tried him in offloading shoe he could not tolerate this. He does not have good balance. 09/11/2019 upon evaluation today patient appears to be doing more poorly in regard to his foot ulcer as compared to last time I saw him. Unfortunately he does tell me as we discussed on Friday that he had pus and blood coming from the area underneath the  callus. Unfortunately I think that this is a combination of the callus not being trimmed away enough and potentially the felt pad causing an abnormal rubbing which has happened before which led to this becoming more irritated and likely infected based on what I am seeing today. The patient takes ampicillin all the time. Subsequently he also states that he has been trying for a couple of days to take the clindamycin of which she had some leftover from previous. With that being said it upset his stomach. The antibiotic that I sent in for him on Friday unfortunately I wrote the wrong number on as far as the number of pills that he needed. With that being said I corrected that this morning but he tells me is not can pick it up as that seems to be making him have trouble with his stomach being upset at this point. Fortunately there is no signs of active infection at this time systemically. Objective Constitutional Well-nourished and well-hydrated in no acute distress. Vitals Time Taken: 12:50 PM, Height: 71 in, Weight: 205 lbs, BMI: 28.6, Temperature: 97.8 F, Pulse: 83 bpm, Respiratory Rate: 18 breaths/min, Blood Pressure: 161/68 mmHg. Respiratory normal breathing without difficulty. Psychiatric this patient is able to make decisions and demonstrates good insight into disease process. Alert and Oriented x 3. pleasant and cooperative. General Notes: His wound unfortunately had a significant amount of callus that needed to be cleaned away. This was warm to touch and does show signs of infection. He is currently on ampicillin which he takes on a regular basis. With that being said I am and I did obtain a culture today which was done post debridement once the callus was cleaned away and we will see what this shows. Depending on the results of the culture I will initiate antibiotic treatment as necessary. Integumentary (Hair, Skin) Haven, Danni E. ([id number]) Wound #5 status is Open. Original cause  of wound was Pressure Injury. The wound is located on the Left,Lateral Foot. The wound measures 0.4cm length x 2.1cm width x 0.3cm depth; 0.66cm^2 area and 0.198cm^3 volume. There is Fat Layer (Subcutaneous Tissue) Exposed exposed. There is no tunneling or undermining noted. There is a medium amount of serosanguineous drainage noted. The wound margin is flat and intact. There is large (67-100%) pink granulation within the wound bed. There is no necrotic tissue within the wound bed. Wound #6 status is Open. Original cause of wound was Trauma. The wound is located on the Left Toe Great. The wound measures 0.5cm length x 0.7cm width x 0.1cm depth; 0.275cm^2 area and 0.027cm^3 volume. There is Fat Layer (Subcutaneous Tissue) Exposed exposed. There is no tunneling or undermining noted. There is a medium amount of serous drainage noted. The wound margin is flat and intact. There is large (67- 100%) pink granulation within the wound bed. There is no necrotic tissue within the wound bed. 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 There was a Excisional Skin/Subcutaneous Tissue Debridement with a total area of 0.84 sq cm performed by [name], Aamina Skiff E., [name]. With the following instrument(s): Curette to remove Viable and Non-Viable tissue/material. Material removed includes Callus, Subcutaneous Tissue, Slough, Skin: Dermis, and Skin: Epidermis after achieving pain control using Lidocaine 4% Topical Solution. 1 specimen was taken by a Swab and sent to the lab per facility protocol. A time out was conducted at 13:18, prior to the  start of the procedure. A Minimum amount of bleeding was controlled with Pressure. The procedure was tolerated well with a pain level of 0 throughout and a pain level of 0 following the procedure. Post Debridement Measurements: 1.6cm length x 2.1cm width x 0.3cm depth; 0.792cm^3 volume. Character of Wound/Ulcer Post Debridement is improved. Severity of Tissue Post Debridement is: Fat layer exposed. Post procedure Diagnosis Wound #5: Same as Pre-Procedure Plan Wound Cleansing: Wound #5 Left,Lateral Foot: May shower with protection. Wound #6 Left Toe Great: May shower with protection. Anesthetic (add to Medication List): Wound #5 Left,Lateral Foot: Topical Lidocaine 4% cream applied to wound bed prior to debridement (In Clinic Only). Wound #6 Left Toe Great: Topical Lidocaine 4% cream applied to wound bed prior to debridement (In Clinic Only). Primary Wound Dressing: Wound #5 Left,Lateral Foot: Silver Collagen Wound #6 Left Toe Great: Silver Collagen Secondary Dressing: Wound #5 Left,Lateral Foot: Boardered Foam Dressing Wound #6 Left Toe Great: Boardered Foam Dressing Dressing Change Frequency: Wound #5 Left,Lateral Foot: Change dressing every other day. Wound #6 Left Toe Great: Change dressing every other day. Follow-up Appointments: Return Appointment in 1 week. [name], [name] ([id number]) Edema Control: Wound #5 Left,Lateral Foot: Elevate legs to the level of the heart and pump ankles as often as possible Additional Orders / Instructions: Wound #5 Left,Lateral Foot: Other: - Patient to follow-up with [facility] for adjustments to braces and shoes. Requested paperwork sent to [facility]. Laboratory ordered were: Wound culture routine 1. I would recommend currently that we continue with the collagen to both the area on his plantar foot as well as the toe. 2. I am also can recommend at this time that we have him continue with ampicillin and depend on the results of the wound  culture I will make adjustments as needed in his antibiotic regimen hopefully we should have that by Friday. 3. I am also can recommend at this time that we continue with the appropriate offloading as best we can unfortunately he cannot use an offloading shoe but nonetheless he is able to use his custom shoe along with his foot drop brace which is at least beneficial in my opinion. This is about the best we can do and unfortunately putting the felt on his foot seems to make things that much worse. We will see patient back for reevaluation in 1 week here in the clinic. If anything worsens or changes patient will contact our office for additional recommendations. Electronic Signature(s) Signed: 09/11/2019 3:41:33 PM By: Worthy Keeler [name] Previous Signature: 09/11/2019 1:29:50 PM Version By: Worthy Keeler [name] Entered By: Worthy Keeler on 09/11/2019 15:41:33 [name] ([id number]) -------------------------------------------------------------------------------- SuperBill Details Patient Name: [name] Date of Service: 09/11/2019 Medical Record Number: [id number] Patient Account Number: [id number] Date of Birth/Sex: March 24, 1946 (73 y.o.  M) Treating RN: Montey Hora Primary Care Provider: [name] Other Clinician: Referring Provider: [name] Treating Provider/Extender: Melburn Hake, Krystle Oberman Weeks in Treatment: 30 Diagnosis Coding ICD-10 Codes Code Description E11.621 Type 2 diabetes mellitus with foot ulcer E11.42 Type 2 diabetes mellitus with diabetic polyneuropathy L97.522 Non-pressure chronic ulcer of other part of left foot with fat layer exposed M21.372 Foot drop, left foot Facility Procedures CPT4 Code: [id number] Description: 09794 - DEB SUBQ TISSUE 20 SQ CM/< Modifier: Quantity: 1 CPT4 Code: Description: ICD-10 Diagnosis Description L97.522 Non-pressure chronic ulcer of other part of left foot with fat layer exp Modifier: osed Quantity: Physician  Procedures CPT4 Code: 9971820 Description: 11042 - WC PHYS SUBQ TISS 20 SQ CM Modifier: Quantity: 1 CPT4 Code: Description: ICD-10 Diagnosis Description L97.522 Non-pressure chronic ulcer of other part of left foot with fat layer exp Modifier: osed Quantity: Electronic Signature(s) Signed: 09/11/2019 1:30:07 PM By: Worthy Keeler [name] Entered By: Worthy Keeler on 09/11/2019 13:30:06

## 2019-09-12 ENCOUNTER — Ambulatory Visit (INDEPENDENT_AMBULATORY_CARE_PROVIDER_SITE_OTHER): Payer: Medicare Other

## 2019-09-12 DIAGNOSIS — E291 Testicular hypofunction: Secondary | ICD-10-CM

## 2019-09-12 MED ORDER — TESTOSTERONE CYPIONATE 200 MG/ML IM SOLN
150.0000 mg | Freq: Once | INTRAMUSCULAR | Status: AC
  Administered 2019-09-12: 150 mg via INTRAMUSCULAR

## 2019-09-12 NOTE — Progress Notes (Signed)
Per orders of Dr. Frederico Hamman Copland, injection of Testosterone given by Lurlean Nanny.  Patient tolerated injection well.

## 2019-09-15 LAB — AEROBIC CULTURE W GRAM STAIN (SUPERFICIAL SPECIMEN)

## 2019-09-17 ENCOUNTER — Encounter: Payer: Self-pay | Admitting: Internal Medicine

## 2019-09-17 ENCOUNTER — Ambulatory Visit (INDEPENDENT_AMBULATORY_CARE_PROVIDER_SITE_OTHER): Payer: Medicare Other | Admitting: Internal Medicine

## 2019-09-17 ENCOUNTER — Encounter: Payer: Medicare Other | Admitting: Physician Assistant

## 2019-09-17 ENCOUNTER — Other Ambulatory Visit: Payer: Self-pay

## 2019-09-17 VITALS — BP 130/60 | HR 82 | Ht 71.5 in | Wt 199.0 lb

## 2019-09-17 DIAGNOSIS — E1165 Type 2 diabetes mellitus with hyperglycemia: Secondary | ICD-10-CM

## 2019-09-17 DIAGNOSIS — E663 Overweight: Secondary | ICD-10-CM

## 2019-09-17 DIAGNOSIS — IMO0002 Reserved for concepts with insufficient information to code with codable children: Secondary | ICD-10-CM

## 2019-09-17 DIAGNOSIS — E11621 Type 2 diabetes mellitus with foot ulcer: Secondary | ICD-10-CM | POA: Diagnosis not present

## 2019-09-17 DIAGNOSIS — M21372 Foot drop, left foot: Secondary | ICD-10-CM | POA: Diagnosis not present

## 2019-09-17 DIAGNOSIS — E781 Pure hyperglyceridemia: Secondary | ICD-10-CM

## 2019-09-17 DIAGNOSIS — L97522 Non-pressure chronic ulcer of other part of left foot with fat layer exposed: Secondary | ICD-10-CM | POA: Diagnosis not present

## 2019-09-17 DIAGNOSIS — E1142 Type 2 diabetes mellitus with diabetic polyneuropathy: Secondary | ICD-10-CM

## 2019-09-17 LAB — POCT GLYCOSYLATED HEMOGLOBIN (HGB A1C): Hemoglobin A1C: 13 % — AB (ref 4.0–5.6)

## 2019-09-17 MED ORDER — HUMULIN R U-500 KWIKPEN 500 UNIT/ML ~~LOC~~ SOPN: PEN_INJECTOR | SUBCUTANEOUS | 5 refills | Status: DC

## 2019-09-17 NOTE — Patient Instructions (Addendum)
Please continue: Type of insulin Breakfast Lunch Dinner  Humulin R 40-45 25-30 40-45  Humulin N 40-45 - 40-45   STOP DRIED FRUIT!  STOP EATING AT NIGHT!  Please try again to start Ozempic 0.5 mg weekly.  If possible, Try to switch from N and R insulin to U500 insulin: - before b'fast: 80-90 units - before lunch: 60 units - before dinner: 80-90 units  Please return in 3 months with your sugar log.

## 2019-09-17 NOTE — Progress Notes (Addendum)
[name], [name] ([id number]) Visit Report for 09/17/2019 Chief Complaint Document Details Patient Name: [name], [name] Date of Service: 09/17/2019 3:00 PM Medical Record Number: [id number] Patient Account Number: [id number] Date of Birth/Sex: October 24, 1945 (73 y.o. M) Treating RN: Army Melia Primary Care Provider: [name] Other Clinician: Referring Provider: [name] Treating Provider/Extender: Melburn Hake, Torren Maffeo Weeks in Treatment: 31 Information Obtained from: Patient Chief Complaint Left dorsal foot ulcer 02/09/2019; patient is here for review of the wound on the left lateral foot roughly at the level of the base of the fifth metatarsal Electronic Signature(s) Signed: 09/17/2019 3:04:11 PM By: Worthy Keeler [name] Entered By: Worthy Keeler on 09/17/2019 [place], [place]. ([id number]) -------------------------------------------------------------------------------- HPI Details Patient Name: [name] Date of Service: 09/17/2019 3:00 PM Medical Record Number: [id number] Patient Account Number: [id number] Date of Birth/Sex: 06-16-1945 (73 y.o. M) Treating RN: Army Melia Primary Care Provider: [name] Other Clinician: Referring Provider: [name] Treating Provider/Extender: Melburn Hake, Antoneo Ghrist Weeks in Treatment: 31 History of Present Illness HPI Description: 10/04/16 on evaluation today patient presents for initial evaluation concerning a laceration of the right wrist and hand on the bowler aspect. He tells me that he fell into glass following a medication change in regard to his diabetes. He initially went to [facility] where she was sutured and subsequently his daughter who is a Marine scientist removed the teachers at the appropriate time. Unfortunately the wound has done poorly and dehissed which has caused him some trouble including infection. He has been on antibiotics both topically and orally though the wound continues to show signs of necrotic  tissue according to notes which is what he was sent to [place] for evaluation concerning. He does have diabetes and unfortunately this is severely uncontrolled. He does see an endocrinologist and is working on this. 10/11/2016 -- the patient's notes from the ER were reviewed and he had his suturing done on 09/17/2016 and had necrotic skin flaps which had to be trimmed the last visit he was here. He has managed to get his Santyl ointment and has been using this appropriately. 10/21/16 patient's wound appears to be doing very well on evaluation today. He still has some Slough covering the wound bed but this is nowhere near as severe as when i saw this initially two weeks ago nor even my review of his pictures last week. I'm pleased with how this is progressing. Patient's wound over the right form appears to be doing fairly well on evaluation today. There is no evidence of infection and he has a small area at the crease of where she is wrist extends and flexes that is still open and appears to be having difficulty due to the fact that it is cracking open. I think this is something that may be controlled with moisture control that is keeping the wound a little bit more moist. Nonetheless this is a tiny region compared to the initial wound and overall appears to be doing very well. No fevers, chills, nausea, or vomiting noted at this time. 11/25/16 on evaluation today patient appears to be doing well in regard to his right wrist ulcer. In fact this appears to be completely healed he is having no discomfort. He is pleased with how this is progressed. Readmission: 06/24/17 on evaluation today patient appears back in our clinic regarding issues that he has been having with his right ankle on the lateral malleolus as well is in the dorsal surface of his left foot. Both have  been going on for several weeks unfortunately. He tells me that initially this was being managed by Dr. Edilia Bo and I did review the notes from  Dr. Edilia Bo in epic. Patient was placed on amoxicillin initially and then subsequently Keflex following. He has been using triple antibiotic ointment over-the-counter along with a Band-Aid for the wound currently. He did not have any Santyl remaining. His most recent hemoglobin A1c which was just two days ago was 10.3. Fortunately he seems to have excellent blood flow in the bilateral lower extremities with his left ABI being 0.95 in the right ABI 1.01. Patient does have some pain in regard to each of these ulcerations but he tells me it's not nearly as severe as the hand wound that I help treat earlier over the summer. This was in 2018. 07/15/17 on evaluation today patient's lower surety ulcer is actually both appear to be doing fairly well at this point. He does have a little bit of skin overlapping the ulcer on the right lateral ankle this was debrided away today just with saline and gauze without complication patient had no discomfort or pain. Otherwise patient's left foot ulcer does appear to be showing signs of improvement he does seem to have a right great toe. Nikki and noted at this point in time. I think that this does need to be managed at this point. No fevers, chills, nausea, or vomiting noted at this time. 07/22/17 on evaluation today patient's wounds in regard to his lower extremities appear to be doing somewhat better. His right ankle wound in fact appears to likely be completely closed. The left dorsal foot ulcer though still open does appear to be a little bit smaller and does also seem to be making good progress. He has not been having problems with the treatment at this point in the general he does not seem to show as much erythema surrounding the wound bed which is good news. He is not having significant discomfort. 08/12/17 on evaluation today patients wounds of both appeared to be doing better in fact they both appear to be completely healed. Overall I'm very pleased with how things  have progressed over the past week even more than what I expected. Obviously this is good news and he is happy about this. He did have a fall when going to his orthopedic doctor recently fortunately he does not appear to have injured anything severely but nonetheless does have a couple of bumps and bruises nothing significant at this time but he needs to be seen and wound care for. He may have broken his left wrist he is following up with orthopedics in that regard. Readmission: 10/24/17 on evaluation today patient presents for reevaluation after having been discharged Aug 12, 2017. His wound was healed at that point although he states that it has been somewhat discolored since that time. He has not experienced the drainage as far as the wound is concerned but the fact that it was still discolored been greater than two months after the fact made him want to come in for reevaluation. Upon inspection today the patient has no pain although he does have neuropathy. He does have a discolored region of the dorsal surface of the left foot which was the site where the ulcer was that we previously treated. The good news is he does not again have any discomfort although obviously I do believe this may be some scar tissue and just discoloration in that regard. Again he hasn't had any drainage or  discharge since I last saw him and at this point I see no evidence of that either this area shows complete epithelialization. In general I do not feel like there's any significant issue at this point in that regard. 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 He has been in this clinic before in 2018 with a laceration on his right hand and then again in 2019 with an area over the right lateral malleolus. 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 He states  that the wound is been there for the last week or KADIEN, LINEMAN. ([id number]) 2. It is been uncomfortable to walk on. The man is an active man having to look after an elderly mother. Past medical history, type 2 diabetes with polyneuropathy with a recent hemoglobin A1c in September 2011 0.3, lumbar disc disease, hypergonadism., Left foot drop and gout. We did not repeat his ABIs in the clinic quoting values from July 2019 of 0.95 and 1.01. He is not felt to have significant PAD 02/19/2019 on evaluation today patient actually appears to be doing better with regard to his foot wound based on what I am seeing currently. He was seen by Dr. Dellia Nims on readmission back into the clinic last week when I was off. The good news is the patient has been tolerating the dressing changes without any complication in fact has not really had much in the way of drainage. He does have an AFO brace which she believes is actually pushing on this area as well and has contributed to the issue. Fortunately there is no signs of active infection at this point. His ABIs appear to be well. 03/05/2019 on evaluation today patient appears to be doing well in regard to his foot ulcer. He has been tolerating the dressing changes without complication. Fortunately there is no signs of active infection. The wound is measuring somewhat smaller and overall I feel like he is making good progress at this point. 03/12/2019 on evaluation today patient actually appears to be doing quite well with regard to his foot ulcer. The wound is looking better the measurement is not significantly better at this point unfortunately but nonetheless again the overall appearance is improving I think he is making some progress. I still think the big issue here is foot drop and again the patient wants to see if potentially he could get a brace to help with this. Subsequently I think that we can definitely make a referral to [facility] to see if they can help  [place] in this regard. He is in agreement with that plan. 03/26/2019 on evaluation today patient appears to be doing more poorly in regard to his foot ulcer at this time. He did see podiatry in order to see about getting a brace but unfortunately he tells me that Medicare is not likely to pay for one for him as he had 1 I guess 8 months ago. However this one that apparently goes in his shoes that does not seem to be working out well for him. Unfortunately there are signs of active infection at this time. No fevers, chills, nausea, vomiting, or diarrhea. Systemically. The patient has erythema on the dorsal surface of his foot that tracks from the lateral portion of his foot and again he does have a blister around the wound as well which is worse compared  to previous. Overall I am concerned. 04/02/2019 on evaluation today patient appears to be doing about the same with regard to his wound. Unfortunately there is no signs of significant improvement although I do think there is no signs of infection which is good news. His drainage is also slowed down tremendously. I think at this point he would benefit from the initiation of a total contact cast. The only concern I have is that over his balance. I explained to the patient that if we were to initiate the cast I think it would be greatly beneficial for him but he would need to ensure not to walk at all unless he uses a walker he actually has 2 walkers one for inside his home and one that he keeps out in the car. For that reason he would be covered in that regard. I just do not want him to have any accidents and fall with the cast. 12/28-Patient comes in 1 week out a day early for his appointment he was in the hospital for 2 days for uncontrolled hyperglycemia and required management of high blood sugars, with some adjustments in his home regimen of insulin, patient stubbed his right great toe today and put a Band-Aid on it, he is here for his left lateral foot  ulcer and we are using silver alginate dressing, he was intact total contact cast with difficulty with balance to the extent that that had to be discontinued 04/16/2019 on evaluation today patient appears to be doing better with regard to the overall appearance of his wound size. This is good news. We had attempted a total contact cast but unfortunately he had some falling issues that the family contacted [place] about we took the cast off we did not put it back on. Nonetheless it ended up that just a couple days later he was in the hospital due to elevations in his blood sugar which went to around 500 and even a little higher. Subsequently he was admitted to the hospital and was in the hospital for a total of 3 days and that included Christmas day. Fortunately there got his blood sugar under control he tells me that this is running about 200 now which is much better news. There does not appear to be any signs of active infection at this time. No fevers, chills, nausea, vomiting, or diarrhea. 04/30/2019 on evaluation today patient actually appears to be doing slightly worse compared to previous as far as the overall size of his wound is concerned. There also is some need for sharp debridement today as well. Fortunately there is no evidence of active infection which is good news. No fevers, chills, nausea, vomiting, or diarrhea. 05/07/2019 upon evaluation today patient appears to be doing excellent in regard to his plantar foot ulcer. He has been tolerating the dressing changes without complication. Fortunately there is no signs of active infection at this time. No fevers, chills, nausea, vomiting, or diarrhea. He does tell me that he actually got in touch with [employer] and they feel like they can add diagnosis codes to his orders in order to get an external brace for him as opposed to the one that goes internal to issue. That would be excellent for his foot drop and I did tell him that if there is anything I  need to fill out in that regard I will be more than happy to oblige and fill out what is needed for [employer]. 05/14/2019 upon evaluation today patient's original wound site actually appears to  be doing much better which is good news. Fortunately there is no signs of active infection at this time. No fevers, chills, nausea, vomiting, or diarrhea. With that being said the patient unfortunately is continuing to have issues with rubbing he tells me that the felt slid on his foot although he did not realize it until today. Nonetheless I think this did cause a blister around the edge of the wound and he does an open wound that is slightly larger overall in measurement compared to last time mainly due to this blister. Other than that I feel like he is doing quite well. 05/21/2019 upon evaluation today patient appears to be doing excellent in regard to his wound at this time on the plantar foot. He is showing signs of improvement he does have some slough and biofilm noted on the surface of the wound this is going require some sharp debridement today as well. Fortunately there is no evidence of active infection. No fevers, chills, nausea, vomiting, or diarrhea. 05/28/2019 on evaluation today patient's wound actually appears to be measuring smaller upon initial inspection and I do believe that it is doing well. With that being said he continues to have a blister on the outside/lateral edge of the wound that occurs as a result of rubbing due to his dropfoot currently. Subsequently we have filled out the paperwork for his new external brace but again that does take several weeks to get that completed once everything is returned to back we did send this back last week as far as the paperwork was concerned. With that being said there does not appear to be any signs of active infection at this time. 06/04/2019 on evaluation today patient's wound actually appears to be doing slightly better compared to last week which is  good news. He still states that his insurance unfortunately does not seem to want to cover any of his new brace. He is going to just pay for this himself. Nonetheless he is going by there today when he leaves here that is [employer] in [name] that is going to be making this brace for him. 06/18/2019 upon evaluation today patient fortunately does have his new brace which is the external AFO brace with bilateral uprights. Subsequently he seems to be doing much better in fact the wound already appears better. He has not even been using the felt he is just using a dressing over [place] ([id number]) top of the wound and overall I am very pleased with how things seem to be progressing. There is no signs of active infection at this time which is great news and I think that the AFO is good to help him tremendously as far as trying to keep pressure off of the 06/25/2019 upon evaluation today patient seems to be making good progress in regard to his wound. He has been tolerating the dressing changes without complication. Fortunately there is no signs of active infection at this time. No fevers, chills, nausea, vomiting, or diarrhea. 07/02/2019 upon evaluation today patient unfortunately had a blistered area on the bottom of his foot tracking more towards the midline of the plantar aspect. He does not seem to have issues with rubbing more laterally that he used to have but nonetheless this is still giving him some trouble and causing problems here. Fortunately there is no signs of active infection at this time. No fevers, chills, nausea, vomiting, or diarrhea. 07/09/2019 upon evaluation today patient appears to be doing well with regard to his plantar foot  ulcer. Definitely this is much improved compared to last week's visit which again last week the issue there was that he had an area of callus buildup on the plantar aspect of his foot I was able to clean this away and after effectively doing so the wound  seems to be doing much better which is great news. There is no signs of active infection at this time. 07/16/2019 upon evaluation today patient appears to be making some progress in my opinion with regard to his plantar foot ulcer. He has been tolerating the dressing changes without complication. Fortunately there is no evidence of active infection at this time which is great news. 07/23/2019 upon evaluation today patient appears to be doing well with regard to his wounds on the plantar foot. He has been tolerating the dressing changes without complication. These do seem to be healing. 08/06/2019 upon evaluation today patient's wound actually appears to be showing some signs of improvement though he still has a significant amount of callus buildup over this location. That is can require some debridement today. With that being said the actual wound openings seem to be dramatically improved which is great news. There is no signs of active infection at this time. No fevers, chills, nausea, vomiting, or diarrhea. 08/13/2019 upon evaluation today patient appears to be doing well with regard to his foot ulcer. He has been tolerating the dressing changes without complication. Fortunately there is no signs of active infection at this time. No fevers, chills, nausea, vomiting, or diarrhea. 08/20/2019 upon evaluation today patient appears to be doing excellent in regard to his foot ulcer. I do believe he still showing signs of improvement this is very slow but again I think still pressure getting to the area even with his brace and custom shoe is still part of the main issue with the delay here. Nonetheless I do not see any signs of worsening or infection all of which is good news. 5/20; left lateral foot wound. He wears an AFO brace for foot drop related to his diabetes. The wound is small and clean looking he is using silver collagen. We have apparently tried him in offloading shoe he could not tolerate this. He  does not have good balance. 09/11/2019 upon evaluation today patient appears to be doing more poorly in regard to his foot ulcer as compared to last time I saw him. Unfortunately he does tell me as we discussed on Friday that he had pus and blood coming from the area underneath the callus. Unfortunately I think that this is a combination of the callus not being trimmed away enough and potentially the felt pad causing an abnormal rubbing which has happened before which led to this becoming more irritated and likely infected based on what I am seeing today. The patient takes ampicillin all the time. Subsequently he also states that he has been trying for a couple of days to take the clindamycin of which she had some leftover from previous. With that being said it upset his stomach. The antibiotic that I sent in for him on Friday unfortunately I wrote the wrong number on as far as the number of pills that he needed. With that being said I corrected that this morning but he tells me is not can pick it up as that seems to be making him have trouble with his stomach being upset at this point. Fortunately there is no signs of active infection at this time systemically. 09/17/2019 upon evaluation today patient appears to  be doing better in regard to his foot ulcer. Has been taking ampicillin that he had leftover from a previous prescription. With that being said he did go see his doctor today concerning his diabetes and notes that his hemoglobin A1c was 13 obviously this is elevated and there can be working on that. Also did review the culture result that we finally got back for final well that showed rare Pseudomonas and a few group B strep noted. The Pseudomonas was sensitive to Cipro but again the wound is doing so much better with the ampicillin I think this is probably not a causative organism I do not see anything that really has me concerned as far as infection is concerned today. Therefore I would hold off  on prescribing Cipro for the patient and have him discontinue the ampicillin until completion. Electronic Signature(s) Signed: 09/17/2019 3:51:37 PM By: Worthy Keeler [name] Entered By: Worthy Keeler on 09/17/2019 15:51:37 [name] ([id number]) -------------------------------------------------------------------------------- Physical Exam Details Patient Name: [name] Date of Service: 09/17/2019 3:00 PM Medical Record Number: [id number] Patient Account Number: [id number] Date of Birth/Sex: [birth year] (73 y.o. M) Treating RN: Army Melia Primary Care Provider: [name] Other Clinician: Referring Provider: [name] Treating Provider/Extender: [name], Daelon Dunivan Weeks in Treatment: 56 Constitutional Well-nourished and well-hydrated in no acute distress. Respiratory normal breathing without difficulty. Psychiatric this patient is able to make decisions and demonstrates good insight into disease process. Alert and Oriented x 3. pleasant and cooperative. Notes With regard to the patient's wounds no sharp debridement was necessary today I think that the end of his toe is actually doing well and looks close to complete resolution. As far as the hemoglobin A1c is concerned that is elevated they are going to work on that as far as his doctor who manages his diabetes and the patient. With that being said the culture did not seem to show anything that I think the ampicillin will not take care of so we will continue with having him take the ampicillin at this time not making any changes in the regimen for antibiotics. Electronic Signature(s) Signed: 09/17/2019 3:52:25 PM By: Worthy Keeler [name] Entered By: Worthy Keeler on 09/17/2019 15:52:24 [name] ([id number]) -------------------------------------------------------------------------------- Physician Orders Details Patient Name: [name] Date of Service: 09/17/2019 3:00 PM Medical Record Number:  [id number] Patient Account Number: [id number] Date of Birth/Sex: August 07, 1945 (73 y.o. M) Treating RN: Army Melia Primary Care Provider: [name] Other Clinician: Referring Provider: [name] Treating Provider/Extender: Melburn Hake, Ogle Hoeffner Weeks in Treatment: 31 Verbal / Phone Orders: No Diagnosis Coding ICD-10 Coding Code Description E11.621 Type 2 diabetes mellitus with foot ulcer E11.42 Type 2 diabetes mellitus with diabetic polyneuropathy L97.522 Non-pressure chronic ulcer of other part of left foot with fat layer exposed M21.372 Foot drop, left foot Wound Cleansing Wound #5 Left,Lateral Foot o May shower with protection. Wound #6 Left Toe Great o May shower with protection. Anesthetic (add to Medication List) Wound #5 Left,Lateral Foot o Topical Lidocaine 4% cream applied to wound bed prior to debridement (In Clinic Only). Wound #6 Left Toe Great o Topical Lidocaine 4% cream applied to wound bed prior to debridement (In Clinic Only). Primary Wound Dressing Wound #5 Left,Lateral Foot o Silver Collagen Secondary Dressing Wound #5 Left,Lateral Foot o Boardered Foam Dressing o Other - coverlet to toe Wound #6 Left Toe Great o Boardered Foam Dressing o Other - coverlet to toe Dressing Change Frequency Wound #5 Left,Lateral Foot o Change  dressing every other day. Wound #6 Left Toe Great o Change dressing every other day. Follow-up Appointments Wound #5 Left,Lateral Foot o Return Appointment in 1 week. Wound #6 Left Toe Great o Return Appointment in 1 week. Edema Control Wound #5 Left,Lateral Foot o Elevate legs to the level of the heart and pump ankles as often as possible [name], Dustin E. ([id number]) Wound #6 Left Toe Great o Elevate legs to the level of the heart and pump ankles as often as possible Additional Orders / Instructions Wound #5 Left,Lateral Foot o Other: - Patient to follow-up with [facility] for adjustments  to braces and shoes. Requested paperwork sent to [facility]. Wound #6 Left Toe Great o Other: - Patient to follow-up with [facility] for adjustments to braces and shoes. Requested paperwork sent to [facility]. Electronic Signature(s) Signed: 09/17/2019 4:06:31 PM By: Army Melia Signed: 09/17/2019 4:20:46 PM By: Worthy Keeler [name] Entered By: Army Melia on 09/17/2019 15:44:58 [name] ([id number]) -------------------------------------------------------------------------------- Problem List Details Patient Name: [name] Date of Service: 09/17/2019 3:00 PM Medical Record Number: [id number] Patient Account Number: [id number] Date of Birth/Sex: September 10, 1945 (73 y.o. M) Treating RN: Army Melia Primary Care Provider: [name] Other Clinician: Referring Provider: [name] Treating Provider/Extender: Melburn Hake, Johnnae Impastato Weeks in Treatment: 31 Active Problems ICD-10 Encounter Code Description Active Date MDM Diagnosis E11.621 Type 2 diabetes mellitus with foot ulcer 02/09/2019 No Yes E11.42 Type 2 diabetes mellitus with diabetic polyneuropathy 02/09/2019 No Yes L97.522 Non-pressure chronic ulcer of other part of left foot with fat layer 02/09/2019 No Yes exposed M21.372 Foot drop, left foot 02/09/2019 No Yes Inactive Problems Resolved Problems Electronic Signature(s) Signed: 09/17/2019 3:03:35 PM By: Worthy Keeler [name] Entered By: Worthy Keeler on 09/17/2019 15:03:30 [name] ([id number]) -------------------------------------------------------------------------------- Progress Note Details Patient Name: [name] Date of Service: 09/17/2019 3:00 PM Medical Record Number: [id number] Patient Account Number: [id number] Date of Birth/Sex: 03/02/1946 (73 y.o. M) Treating RN: Army Melia Primary Care Provider: [name] Other Clinician: Referring Provider: [name] Treating Provider/Extender: Melburn Hake, Christon Parada Weeks in Treatment:  31 Subjective Chief Complaint Information obtained from Patient Left dorsal foot ulcer 02/09/2019; patient is here for review of the wound on the left lateral foot roughly at the level of the base of the fifth metatarsal History of Present Illness (HPI) 10/04/16 on evaluation today patient presents for initial evaluation concerning a laceration of the right wrist and hand on the bowler aspect. He tells me that he fell into glass following a medication change in regard to his diabetes. He initially went to [facility] where she was sutured and subsequently his daughter who is a Marine scientist removed the teachers at the appropriate time. Unfortunately the wound has done poorly and dehissed which has caused him some trouble including infection. He has been on antibiotics both topically and orally though the wound continues to show signs of necrotic tissue according to notes which is what he was sent to [place] for evaluation concerning. He does have diabetes and unfortunately this is severely uncontrolled. He does see an endocrinologist and is working on this. 10/11/2016 -- the patient's notes from the ER were reviewed and he had his suturing done on 09/17/2016 and had necrotic skin flaps which had to be trimmed the last visit he was here. He has managed to get his Santyl ointment and has been using this appropriately. 10/21/16 patient's wound appears to be doing very well on evaluation today. He still has some Psychologist, sport and exercise  the wound bed but this is nowhere near as severe as when i saw this initially two weeks ago nor even my review of his pictures last week. I'm pleased with how this is progressing. Patient's wound over the right form appears to be doing fairly well on evaluation today. There is no evidence of infection and he has a small area at the crease of where she is wrist extends and flexes that is still open and appears to be having difficulty due to the fact that it is cracking open. I think this is  something that may be controlled with moisture control that is keeping the wound a little bit more moist. Nonetheless this is a tiny region compared to the initial wound and overall appears to be doing very well. No fevers, chills, nausea, or vomiting noted at this time. 11/25/16 on evaluation today patient appears to be doing well in regard to his right wrist ulcer. In fact this appears to be completely healed he is having no discomfort. He is pleased with how this is progressed. Readmission: 06/24/17 on evaluation today patient appears back in our clinic regarding issues that he has been having with his right ankle on the lateral malleolus as well is in the dorsal surface of his left foot. Both have been going on for several weeks unfortunately. He tells me that initially this was being managed by Dr. Edilia Bo and I did review the notes from Dr. Edilia Bo in epic. Patient was placed on amoxicillin initially and then subsequently Keflex following. He has been using triple antibiotic ointment over-the-counter along with a Band-Aid for the wound currently. He did not have any Santyl remaining. His most recent hemoglobin A1c which was just two days ago was 10.3. Fortunately he seems to have excellent blood flow in the bilateral lower extremities with his left ABI being 0.95 in the right ABI 1.01. Patient does have some pain in regard to each of these ulcerations but he tells me it's not nearly as severe as the hand wound that I help treat earlier over the summer. This was in 2018. 07/15/17 on evaluation today patient's lower surety ulcer is actually both appear to be doing fairly well at this point. He does have a little bit of skin overlapping the ulcer on the right lateral ankle this was debrided away today just with saline and gauze without complication patient had no discomfort or pain. Otherwise patient's left foot ulcer does appear to be showing signs of improvement he does seem to have a right great  toe. Nikki and noted at this point in time. I think that this does need to be managed at this point. No fevers, chills, nausea, or vomiting noted at this time. 07/22/17 on evaluation today patient's wounds in regard to his lower extremities appear to be doing somewhat better. His right ankle wound in fact appears to likely be completely closed. The left dorsal foot ulcer though still open does appear to be a little bit smaller and does also seem to be making good progress. He has not been having problems with the treatment at this point in the general he does not seem to show as much erythema surrounding the wound bed which is good news. He is not having significant discomfort. 08/12/17 on evaluation today patients wounds of both appeared to be doing better in fact they both appear to be completely healed. Overall I'm very pleased with how things have progressed over the past week even more than what I expected.  Obviously this is good news and he is happy about this. He did have a fall when going to his orthopedic doctor recently fortunately he does not appear to have injured anything severely but nonetheless does have a couple of bumps and bruises nothing significant at this time but he needs to be seen and wound care for. He may have broken his left wrist he is following up with orthopedics in that regard. Readmission: 10/24/17 on evaluation today patient presents for reevaluation after having been discharged Aug 12, 2017. His wound was healed at that point although he states that it has been somewhat discolored since that time. He has not experienced the drainage as far as the wound is concerned but the fact that it was still discolored been greater than two months after the fact made him want to come in for reevaluation. Upon inspection today the patient has no pain although he does have neuropathy. He does have a discolored region of the dorsal surface of the left foot which was the site where the  ulcer was that we previously treated. The good news is he does not again have any discomfort although obviously I do believe this may be some scar tissue and just discoloration in that regard. Again he hasn't had any drainage or discharge since I last saw him and at this point I see no evidence of that either this area shows complete epithelialization. In general I do not feel like there's any significant issue at this point in that regard. [name], [name] ([id number]) READMISSION 02/09/2019 This is a 74 year old man with type 2 diabetes poorly controlled with significant peripheral neuropathy. He has been in this clinic before in 2018 with a laceration on his right hand and then again in 2019 with an area over the right lateral malleolus. 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 He states that the wound is been there for the last week or 2. It is been uncomfortable to walk on. The man is an active man having to look after an elderly mother. Past medical history, type 2 diabetes with polyneuropathy with a recent hemoglobin A1c in September 2011 0.3, lumbar disc disease, hypergonadism., Left foot drop and gout. We did not repeat his ABIs in the clinic quoting values from July 2019 of 0.95 and 1.01. He is not felt to have significant PAD 02/19/2019 on evaluation today patient actually appears to be doing better with regard to his foot wound based on what I am seeing currently. He was seen by Dr. Dellia Nims on readmission back into the clinic last week when I was off. The good news is the patient has been tolerating the dressing changes without any complication in fact has not really had much in the way of drainage. He does have an AFO brace which she believes is actually pushing on this area as well and has contributed to the issue. Fortunately there is no signs of active infection at this point. His ABIs appear to be  well. 03/05/2019 on evaluation today patient appears to be doing well in regard to his foot ulcer. He has been tolerating the dressing changes without complication. Fortunately there is no signs of active infection. The wound is measuring somewhat smaller and overall I feel like he is making good progress at this point. 03/12/2019 on evaluation today patient actually appears to be doing quite well with regard to his foot  ulcer. The wound is looking better the measurement is not significantly better at this point unfortunately but nonetheless again the overall appearance is improving I think he is making some progress. I still think the big issue here is foot drop and again the patient wants to see if potentially he could get a brace to help with this. Subsequently I think that we can definitely make a referral to [facility] to see if they can help [place] in this regard. He is in agreement with that plan. 03/26/2019 on evaluation today patient appears to be doing more poorly in regard to his foot ulcer at this time. He did see podiatry in order to see about getting a brace but unfortunately he tells me that Medicare is not likely to pay for one for him as he had 1 I guess 8 months ago. However this one that apparently goes in his shoes that does not seem to be working out well for him. Unfortunately there are signs of active infection at this time. No fevers, chills, nausea, vomiting, or diarrhea. Systemically. The patient has erythema on the dorsal surface of his foot that tracks from the lateral portion of his foot and again he does have a blister around the wound as well which is worse compared to previous. Overall I am concerned. 04/02/2019 on evaluation today patient appears to be doing about the same with regard to his wound. Unfortunately there is no signs of significant improvement although I do think there is no signs of infection which is good news. His drainage is also slowed down tremendously.  I think at this point he would benefit from the initiation of a total contact cast. The only concern I have is that over his balance. I explained to the patient that if we were to initiate the cast I think it would be greatly beneficial for him but he would need to ensure not to walk at all unless he uses a walker he actually has 2 walkers one for inside his home and one that he keeps out in the car. For that reason he would be covered in that regard. I just do not want him to have any accidents and fall with the cast. 12/28-Patient comes in 1 week out a day early for his appointment he was in the hospital for 2 days for uncontrolled hyperglycemia and required management of high blood sugars, with some adjustments in his home regimen of insulin, patient stubbed his right great toe today and put a Band-Aid on it, he is here for his left lateral foot ulcer and we are using silver alginate dressing, he was intact total contact cast with difficulty with balance to the extent that that had to be discontinued 04/16/2019 on evaluation today patient appears to be doing better with regard to the overall appearance of his wound size. This is good news. We had attempted a total contact cast but unfortunately he had some falling issues that the family contacted [place] about we took the cast off we did not put it back on. Nonetheless it ended up that just a couple days later he was in the hospital due to elevations in his blood sugar which went to around 500 and even a little higher. Subsequently he was admitted to the hospital and was in the hospital for a total of 3 days and that included Christmas day. Fortunately there got his blood sugar under control he tells me that this is running about 200 now which is much  better news. There does not appear to be any signs of active infection at this time. No fevers, chills, nausea, vomiting, or diarrhea. 04/30/2019 on evaluation today patient actually appears to be doing  slightly worse compared to previous as far as the overall size of his wound is concerned. There also is some need for sharp debridement today as well. Fortunately there is no evidence of active infection which is good news. No fevers, chills, nausea, vomiting, or diarrhea. 05/07/2019 upon evaluation today patient appears to be doing excellent in regard to his plantar foot ulcer. He has been tolerating the dressing changes without complication. Fortunately there is no signs of active infection at this time. No fevers, chills, nausea, vomiting, or diarrhea. He does tell me that he actually got in touch with [employer] and they feel like they can add diagnosis codes to his orders in order to get an external brace for him as opposed to the one that goes internal to issue. That would be excellent for his foot drop and I did tell him that if there is anything I need to fill out in that regard I will be more than happy to oblige and fill out what is needed for [employer]. 05/14/2019 upon evaluation today patient's original wound site actually appears to be doing much better which is good news. Fortunately there is no signs of active infection at this time. No fevers, chills, nausea, vomiting, or diarrhea. With that being said the patient unfortunately is continuing to have issues with rubbing he tells me that the felt slid on his foot although he did not realize it until today. Nonetheless I think this did cause a blister around the edge of the wound and he does an open wound that is slightly larger overall in measurement compared to last time mainly due to this blister. Other than that I feel like he is doing quite well. 05/21/2019 upon evaluation today patient appears to be doing excellent in regard to his wound at this time on the plantar foot. He is showing signs of improvement he does have some slough and biofilm noted on the surface of the wound this is going require some sharp debridement today as well.  Fortunately there is no evidence of active infection. No fevers, chills, nausea, vomiting, or diarrhea. 05/28/2019 on evaluation today patient's wound actually appears to be measuring smaller upon initial inspection and I do believe that it is doing well. With that being said he continues to have a blister on the outside/lateral edge of the wound that occurs as a result of rubbing due to his dropfoot currently. Subsequently we have filled out the paperwork for his new external brace but again that does take several weeks to get that completed once everything is returned to back we did send this back last week as far as the paperwork was concerned. With that being said there does not appear to be any signs of active infection at this time. [name], [name] ([id number]) 06/04/2019 on evaluation today patient's wound actually appears to be doing slightly better compared to last week which is good news. He still states that his insurance unfortunately does not seem to want to cover any of his new brace. He is going to just pay for this himself. Nonetheless he is going by there today when he leaves here that is [employer] in [place] that is going to be making this brace for him. 06/18/2019 upon evaluation today patient fortunately does have his new brace which is the  external AFO brace with bilateral uprights. Subsequently he seems to be doing much better in fact the wound already appears better. He has not even been using the felt he is just using a dressing over top of the wound and overall I am very pleased with how things seem to be progressing. There is no signs of active infection at this time which is great news and I think that the AFO is good to help him tremendously as far as trying to keep pressure off of the 06/25/2019 upon evaluation today patient seems to be making good progress in regard to his wound. He has been tolerating the dressing changes without complication. Fortunately there is no signs  of active infection at this time. No fevers, chills, nausea, vomiting, or diarrhea. 07/02/2019 upon evaluation today patient unfortunately had a blistered area on the bottom of his foot tracking more towards the midline of the plantar aspect. He does not seem to have issues with rubbing more laterally that he used to have but nonetheless this is still giving him some trouble and causing problems here. Fortunately there is no signs of active infection at this time. No fevers, chills, nausea, vomiting, or diarrhea. 07/09/2019 upon evaluation today patient appears to be doing well with regard to his plantar foot ulcer. Definitely this is much improved compared to last week's visit which again last week the issue there was that he had an area of callus buildup on the plantar aspect of his foot I was able to clean this away and after effectively doing so the wound seems to be doing much better which is great news. There is no signs of active infection at this time. 07/16/2019 upon evaluation today patient appears to be making some progress in my opinion with regard to his plantar foot ulcer. He has been tolerating the dressing changes without complication. Fortunately there is no evidence of active infection at this time which is great news. 07/23/2019 upon evaluation today patient appears to be doing well with regard to his wounds on the plantar foot. He has been tolerating the dressing changes without complication. These do seem to be healing. 08/06/2019 upon evaluation today patient's wound actually appears to be showing some signs of improvement though he still has a significant amount of callus buildup over this location. That is can require some debridement today. With that being said the actual wound openings seem to be dramatically improved which is great news. There is no signs of active infection at this time. No fevers, chills, nausea, vomiting, or diarrhea. 08/13/2019 upon evaluation today patient  appears to be doing well with regard to his foot ulcer. He has been tolerating the dressing changes without complication. Fortunately there is no signs of active infection at this time. No fevers, chills, nausea, vomiting, or diarrhea. 08/20/2019 upon evaluation today patient appears to be doing excellent in regard to his foot ulcer. I do believe he still showing signs of improvement this is very slow but again I think still pressure getting to the area even with his brace and custom shoe is still part of the main issue with the delay here. Nonetheless I do not see any signs of worsening or infection all of which is good news. 5/20; left lateral foot wound. He wears an AFO brace for foot drop related to his diabetes. The wound is small and clean looking he is using silver collagen. We have apparently tried him in offloading shoe he could not tolerate this. He does not  have good balance. 09/11/2019 upon evaluation today patient appears to be doing more poorly in regard to his foot ulcer as compared to last time I saw him. Unfortunately he does tell me as we discussed on Friday that he had pus and blood coming from the area underneath the callus. Unfortunately I think that this is a combination of the callus not being trimmed away enough and potentially the felt pad causing an abnormal rubbing which has happened before which led to this becoming more irritated and likely infected based on what I am seeing today. The patient takes ampicillin all the time. Subsequently he also states that he has been trying for a couple of days to take the clindamycin of which she had some leftover from previous. With that being said it upset his stomach. The antibiotic that I sent in for him on Friday unfortunately I wrote the wrong number on as far as the number of pills that he needed. With that being said I corrected that this morning but he tells me is not can pick it up as that seems to be making him have trouble with  his stomach being upset at this point. Fortunately there is no signs of active infection at this time systemically. 09/17/2019 upon evaluation today patient appears to be doing better in regard to his foot ulcer. Has been taking ampicillin that he had leftover from a previous prescription. With that being said he did go see his doctor today concerning his diabetes and notes that his hemoglobin A1c was 13 obviously this is elevated and there can be working on that. Also did review the culture result that we finally got back for final well that showed rare Pseudomonas and a few group B strep noted. The Pseudomonas was sensitive to Cipro but again the wound is doing so much better with the ampicillin I think this is probably not a causative organism I do not see anything that really has me concerned as far as infection is concerned today. Therefore I would hold off on prescribing Cipro for the patient and have him discontinue the ampicillin until completion. Objective Constitutional Well-nourished and well-hydrated in no acute distress. Vitals Time Taken: 3:00 PM, Height: 71 in, Weight: 205 lbs, BMI: 28.6, Temperature: 98.3 F, Pulse: 102 bpm, Respiratory Rate: 18 breaths/min, Blood Pressure: 121/64 mmHg. Respiratory normal breathing without difficulty. Psychiatric this patient is able to make decisions and demonstrates good insight into disease process. Alert and Oriented x 3. pleasant and cooperative. [name], [name] ([id number]) General Notes: With regard to the patient's wounds no sharp debridement was necessary today I think that the end of his toe is actually doing well and looks close to complete resolution. As far as the hemoglobin A1c is concerned that is elevated they are going to work on that as far as his doctor who manages his diabetes and the patient. With that being said the culture did not seem to show anything that I think the ampicillin will not take care of so we will continue  with having him take the ampicillin at this time not making any changes in the regimen for antibiotics. Integumentary (Hair, Skin) Wound #5 status is Open. Original cause of wound was Pressure Injury. The wound is located on the Left,Lateral Foot. The wound measures 0.9cm length x 1cm width x 0.3cm depth; 0.707cm^2 area and 0.212cm^3 volume. There is Fat Layer (Subcutaneous Tissue) Exposed exposed. There is no tunneling or undermining noted. There is a medium amount of serosanguineous drainage  noted. The wound margin is flat and intact. There is large (67-100%) pink granulation within the wound bed. There is no necrotic tissue within the wound bed. Wound #6 status is Open. Original cause of wound was Trauma. The wound is located on the Left Toe Great. The wound measures 0.3cm length x 0.3cm width x 0.1cm depth; 0.071cm^2 area and 0.007cm^3 volume. There is Fat Layer (Subcutaneous Tissue) Exposed exposed. There is no tunneling or undermining noted. There is a medium amount of serous drainage noted. The wound margin is flat and intact. There is large (67- 100%) pink granulation within the wound bed. There is no necrotic tissue within the wound bed. 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 Wound #6 Left Toe Great: May shower with protection. Anesthetic (add to Medication List): Wound #5 Left,Lateral Foot: Topical Lidocaine 4% cream applied to wound bed prior to debridement (In Clinic Only). Wound #6 Left Toe Great: Topical Lidocaine 4% cream applied to wound bed prior to debridement (In Clinic Only). Primary Wound Dressing: Wound #5 Left,Lateral Foot: Silver Collagen Secondary Dressing: Wound #5 Left,Lateral Foot: Boardered  Foam Dressing Other - coverlet to toe Wound #6 Left Toe Great: Boardered Foam Dressing Other - coverlet to toe Dressing Change Frequency: Wound #5 Left,Lateral Foot: Change dressing every other day. Wound #6 Left Toe Great: Change dressing every other day. Follow-up Appointments: Wound #5 Left,Lateral Foot: Return Appointment in 1 week. Wound #6 Left Toe Great: Return Appointment in 1 week. Edema Control: Wound #5 Left,Lateral Foot: Elevate legs to the level of the heart and pump ankles as often as possible Wound #6 Left Toe Great: Elevate legs to the level of the heart and pump ankles as often as possible Additional Orders / Instructions: Wound #5 Left,Lateral Foot: Other: - Patient to follow-up with [facility] for adjustments to braces and shoes. Requested paperwork sent to [facility]. [name], [name] ([id number]) Wound #6 Left Toe Great: Other: - Patient to follow-up with [facility] for adjustments to braces and shoes. Requested paperwork sent to [facility]. 1. I would recommend currently that we go ahead and initiate treatment with continuation of the silver collagen which the patient seems to be doing very well with at this point. 2. I am also going to recommend that we continue with the bordered foam dressing to cover which seems to be doing well. She is using a Band- Aid to cover the toe for protection. 3. I would also recommend he continue to wear his brace along with his offloading shoes. Obviously his diabetic shoes are probably the best for him unfortunately a more aggressive offloading shoe is just not possible with his balance. We will see patient back for reevaluation in 1 week here in the clinic. If anything worsens or changes patient will contact our office for additional recommendations. Electronic Signature(s) Signed: 09/17/2019 3:53:09 PM By: Worthy Keeler [name] Entered By: Worthy Keeler on 09/17/2019 15:53:09 [name]  ([id number]) -------------------------------------------------------------------------------- SuperBill Details Patient Name: [name] Date of Service: 09/17/2019 Medical Record Number: [id number] Patient Account Number: [id number] Date of Birth/Sex: 26-Jun-1945 (73 y.o. M) Treating RN: Army Melia Primary Care Provider: [name] Other Clinician: Referring Provider: [name] Treating Provider/Extender: Melburn Hake, Bekki Tavenner Weeks in Treatment: 31 Diagnosis Coding ICD-10 Codes Code Description E11.621 Type  2 diabetes mellitus with foot ulcer E11.42 Type 2 diabetes mellitus with diabetic polyneuropathy L97.522 Non-pressure chronic ulcer of other part of left foot with fat layer exposed M21.372 Foot drop, left foot Facility Procedures CPT4 Code: 16109604 Description: 99213 - WOUND CARE VISIT-LEV 3 EST PT Modifier: Quantity: 1 Physician Procedures CPT4 Code: 5409811 Description: 91478 - WC PHYS LEVEL 3 - EST PT Modifier: Quantity: 1 CPT4 Code: Description: ICD-10 Diagnosis Description E11.621 Type 2 diabetes mellitus with foot ulcer E11.42 Type 2 diabetes mellitus with diabetic polyneuropathy L97.522 Non-pressure chronic ulcer of other part of left foot with fat layer ex M21.372 Foot drop,  left foot Modifier: posed Quantity: Electronic Signature(s) Signed: 09/17/2019 3:53:25 PM By: Worthy Keeler [name] Entered By: Worthy Keeler on 09/17/2019 15:53:24

## 2019-09-17 NOTE — Addendum Note (Signed)
Addended by: Cardell Peach I on: 09/17/2019 01:34 PM   Modules accepted: Orders

## 2019-09-17 NOTE — Progress Notes (Signed)
[name] ([id number]) Visit Report for 09/17/2019 Arrival Information Details Patient Name: [name] Date of Service: 09/17/2019 3:00 PM Medical Record Number: [id number] Patient Account Number: [id number] Date of Birth/Sex: 1945-05-11 (73 y.o. M) Treating RN: Army Melia Primary Care Prentis Langdon: [name] Other Clinician: Referring [name] Hiltz: [name] Treating Netta Fodge/Extender: Melburn Hake, HOYT Weeks in Treatment: 69 Visit Information History Since Last Visit Added or deleted any medications: No Patient Arrived: Cane Any new allergies or adverse reactions: No Arrival Time: 15:01 Had a fall or experienced change in No Accompanied By: self activities of daily living that may affect Transfer Assistance: None risk of falls: Patient Identification Verified: Yes Signs or symptoms of abuse/neglect since last visito No Secondary Verification Process Completed: Yes Hospitalized since last visit: No Patient Has Alerts: Yes Implantable device outside of the clinic excluding No Patient Alerts: DMII cellular tissue based products placed in the center since last visit: Has Dressing in Place as Prescribed: Yes Pain Present Now: Yes Electronic Signature(s) Signed: 09/17/2019 4:25:54 PM By: Lorine Bears RCP, RRT, CHT Entered By: Lorine Bears on 09/17/2019 15:03:01 [name] ([id number]) -------------------------------------------------------------------------------- Clinic Level of Care Assessment Details Patient Name: [name] Date of Service: 09/17/2019 3:00 PM Medical Record Number: [id number] Patient Account Number: [id number] Date of Birth/Sex: 07-24-1945 (73 y.o. M) Treating RN: Army Melia Primary Care Dequandre Cordova: [name] Other Clinician: Referring Aprile Dickenson: [name] Treating Toriann Spadoni/Extender: Melburn Hake, HOYT Weeks in Treatment: 31 Clinic Level of Care Assessment Items TOOL 4 Quantity Score []   - Use when only an EandM is performed on FOLLOW-UP visit 0 ASSESSMENTS - Nursing Assessment / Reassessment X - Reassessment of Co-morbidities (includes updates in patient status) 1 10 X- 1 5 Reassessment of Adherence to Treatment Plan ASSESSMENTS - Wound and Skin Assessment / Reassessment []  - Simple Wound Assessment / Reassessment - one wound 0 X- 2 5 Complex Wound Assessment / Reassessment - multiple wounds []  - 0 Dermatologic / Skin Assessment (not related to wound area) ASSESSMENTS - Focused Assessment []  - Circumferential Edema Measurements - multi extremities 0 []  - 0 Nutritional Assessment / Counseling / Intervention []  - 0 Lower Extremity Assessment (monofilament, tuning fork, pulses) []  - 0 Peripheral Arterial Disease Assessment (using hand held doppler) ASSESSMENTS - Ostomy and/or Continence Assessment and Care []  - Incontinence Assessment and Management 0 []  - 0 Ostomy Care Assessment and Management (repouching, etc.) PROCESS - Coordination of Care X - Simple Patient / Family Education for ongoing care 1 15 []  - 0 Complex (extensive) Patient / Family Education for ongoing care []  - 0 Staff obtains Programmer, systems, Records, Test Results / Process Orders []  - 0 Staff telephones HHA, Nursing Homes / Clarify orders / etc []  - 0 Routine Transfer to another Facility (non-emergent condition) []  - 0 Routine Hospital Admission (non-emergent condition) []  - 0 New Admissions / Biomedical engineer / Ordering NPWT, Apligraf, etc. []  - 0 Emergency Hospital Admission (emergent condition) X- 1 10 Simple Discharge Coordination []  - 0 Complex (extensive) Discharge Coordination PROCESS - Special Needs []  - Pediatric / Minor Patient Management 0 []  - 0 Isolation Patient Management []  - 0 Hearing / Language / Visual special needs []  - 0 Assessment of Community assistance (transportation, D/C planning, etc.) []  - 0 Additional assistance / Altered mentation []  - 0 Support  Surface(s) Assessment (bed, cushion, seat, etc.) INTERVENTIONS - Wound Cleansing / Measurement [name], [name] ([id number]) []  - 0 Simple Wound Cleansing - one wound X- 2 5 Complex  Wound Cleansing - multiple wounds X- 1 5 Wound Imaging (photographs - any number of wounds) []  - 0 Wound Tracing (instead of photographs) []  - 0 Simple Wound Measurement - one wound X- 2 5 Complex Wound Measurement - multiple wounds INTERVENTIONS - Wound Dressings []  - Small Wound Dressing one or multiple wounds 0 X- 2 15 Medium Wound Dressing one or multiple wounds []  - 0 Large Wound Dressing one or multiple wounds []  - 0 Application of Medications - topical []  - 0 Application of Medications - injection INTERVENTIONS - Miscellaneous []  - External ear exam 0 []  - 0 Specimen Collection (cultures, biopsies, blood, body fluids, etc.) []  - 0 Specimen(s) / Culture(s) sent or taken to Lab for analysis []  - 0 Patient Transfer (multiple staff / Civil Service fast streamer / Similar devices) []  - 0 Simple Staple / Suture removal (25 or less) []  - 0 Complex Staple / Suture removal (26 or more) []  - 0 Hypo / Hyperglycemic Management (close monitor of Blood Glucose) []  - 0 Ankle / Brachial Index (ABI) - do not check if billed separately X- 1 5 Vital Signs Has the patient been seen at the hospital within the last three years: Yes Total Score: 110 Level Of Care: New/Established - Level 3 Electronic Signature(s) Signed: 09/17/2019 4:06:31 PM By: Army Melia Entered By: Army Melia on 09/17/2019 15:45:33 [name] ([id number]) -------------------------------------------------------------------------------- Encounter Discharge Information Details Patient Name: [name] Date of Service: 09/17/2019 3:00 PM Medical Record Number: [id number] Patient Account Number: [id number] Date of Birth/Sex: [birth year] (73 y.o. M) Treating RN: Army Melia Primary Care Dollie Mayse: [name] Other  Clinician: Referring Makaiyah Schweiger: [name] Treating Hamid Brookens/Extender: Melburn Hake, HOYT Weeks in Treatment: 31 Encounter Discharge Information Items Discharge Condition: Stable Ambulatory Status: Ambulatory Discharge Destination: Home Transportation: Private Auto Accompanied By: self Schedule Follow-up Appointment: Yes Clinical Summary of Care: Electronic Signature(s) Signed: 09/17/2019 4:06:31 PM By: Army Melia Entered By: Army Melia on 09/17/2019 15:46:38 [name] ([id number]) -------------------------------------------------------------------------------- Lower Extremity Assessment Details Patient Name: [name] Date of Service: 09/17/2019 3:00 PM Medical Record Number: [id number] Patient Account Number: [id number] Date of Birth/Sex: [birth year] (73 y.o. M) Treating RN: Montey Hora Primary Care Zed Wanninger: [name] Other Clinician: Referring Cono Gebhard: [name] Treating Robert Sperl/Extender: [name], HOYT Weeks in Treatment: 31 Edema Assessment Assessed: [Left: No] [Right: No] Edema: [Left: N] [Right: o] Vascular Assessment Pulses: Dorsalis Pedis Palpable: [Left:Yes] Electronic Signature(s) Signed: 09/17/2019 4:03:27 PM By: Montey Hora Entered By: Montey Hora on 09/17/2019 15:10:59 [name] ([id number]) -------------------------------------------------------------------------------- Multi Wound Chart Details Patient Name: [name] Date of Service: 09/17/2019 3:00 PM Medical Record Number: [id number] Patient Account Number: [id number] Date of Birth/Sex: [birth year] (73 y.o. M) Treating RN: Army Melia Primary Care Latarshia [place]: [name] Other Clinician: Referring Amelita Risinger: [name] Treating Mark Benecke/Extender: Melburn Hake, HOYT Weeks in Treatment: 31 Vital Signs Height(in): 71 Pulse(bpm): 102 Weight(lbs): 205 Blood Pressure(mmHg): 121/64 Body Mass Index(BMI): 29 Temperature(F): 98.3 Respiratory  Rate(breaths/min): 18 Photos: [N/A:N/A] Wound Location: Left, Lateral Foot Left Toe Great N/A Wounding Event: Pressure Injury Trauma N/A Primary Etiology: Diabetic Wound/Ulcer of the Lower Diabetic Wound/Ulcer of the Lower N/A Extremity Extremity Comorbid History: Cataracts, Middle ear problems, Cataracts, Middle ear problems, N/A Sleep Apnea, Hypertension, Type II Sleep Apnea, Hypertension, Type II Diabetes, Gout, Osteoarthritis, Diabetes, Gout, Osteoarthritis, Neuropathy Neuropathy Date Acquired: 01/19/2019 09/09/2019 N/A Weeks of Treatment: 31 0 N/A Wound Status: Open Open N/A Measurements L x W x D (cm) 0.9x1x0.3 0.3x0.3x0.1 N/A Area (cm) : 0.707  0.071 N/A Volume (cm) : 0.212 0.007 N/A % Reduction in Area: -157.10% 74.20% N/A % Reduction in Volume: -158.50% 74.10% N/A Classification: Grade 2 Grade 1 N/A Exudate Amount: Medium Medium N/A Exudate Type: Serosanguineous Serous N/A Exudate Color: red, brown amber N/A Wound Margin: Flat and Intact Flat and Intact N/A Granulation Amount: Large (67-100%) Large (67-100%) N/A Granulation Quality: Pink Pink N/A Necrotic Amount: None Present (0%) None Present (0%) N/A Exposed Structures: Fat Layer (Subcutaneous Tissue) Fat Layer (Subcutaneous Tissue) N/A Exposed: Yes Exposed: Yes Fascia: No Fascia: No Tendon: No Tendon: No Muscle: No Muscle: No Joint: No Joint: No Bone: No Bone: No Epithelialization: Small (1-33%) Large (67-100%) N/A Treatment Notes Electronic Signature(s) Signed: 09/17/2019 4:06:31 PM By: Army Melia Entered By: Army Melia on 09/17/2019 15:42:54 [name] ([id number]) -------------------------------------------------------------------------------- Multi-Disciplinary Care Plan Details Patient Name: [name] Date of Service: 09/17/2019 3:00 PM Medical Record Number: [id number] Patient Account Number: [id number] Date of Birth/Sex: May 03, 1945 (73 y.o. M) Treating RN: Army Melia Primary Care  Marieme Mcmackin: [name] Other Clinician: Referring Cheveyo [place]: [name] Treating Shawanna Zanders/Extender: Melburn Hake, HOYT Weeks in Treatment: 31 Active Inactive Pressure Nursing Diagnoses: Knowledge deficit related to management of pressures ulcers Goals: Patient will remain free from development of additional pressure ulcers Date Initiated: 04/30/2019 Target Resolution Date: 05/14/2019 Goal Status: Active Patient/caregiver will verbalize understanding of pressure ulcer management Date Initiated: 04/30/2019 Target Resolution Date: 05/14/2019 Goal Status: Active Interventions: Assess: immobility, friction, shearing, incontinence upon admission and as needed Treatment Activities: Patient referred for pressure reduction/relief devices : 04/30/2019 Notes: Wound/Skin Impairment Nursing Diagnoses: Impaired tissue integrity Goals: Ulcer/skin breakdown will have a volume reduction of 30% by week 4 Date Initiated: 04/30/2019 Target Resolution Date: 05/31/2019 Goal Status: Active Interventions: Assess patient/caregiver ability to obtain necessary supplies Assess ulceration(s) every visit Treatment Activities: Referred to DME Armarion Greek for dressing supplies : 04/30/2019 Skin care regimen initiated : 04/30/2019 Topical wound management initiated : 04/30/2019 Notes: Electronic Signature(s) Signed: 09/17/2019 4:06:31 PM By: Army Melia Entered By: Army Melia on 09/17/2019 15:42:45 [name] ([id number]) -------------------------------------------------------------------------------- Pain Assessment Details Patient Name: [name] Date of Service: 09/17/2019 3:00 PM Medical Record Number: [id number] Patient Account Number: [id number] Date of Birth/Sex: 1945-09-07 (73 y.o. M) Treating RN: Montey Hora Primary Care  Arizola: [name] Other Clinician: Referring Onell Mcmath: [name] Treating Kaisa Wofford/Extender: Melburn Hake, HOYT Weeks in Treatment: 31 Active  Problems Location of Pain Severity and Description of Pain Patient Has Paino Yes Site Locations Pain Location: Pain in Ulcers Rate the pain. Current Pain Level: 3 Pain Management and Medication Current Pain Management: Electronic Signature(s) Signed: 09/17/2019 4:03:27 PM By: Montey Hora Entered By: Montey Hora on 09/17/2019 15:08:03 [name] ([id number]) -------------------------------------------------------------------------------- Patient/Caregiver Education Details Patient Name: [name] Date of Service: 09/17/2019 3:00 PM Medical Record Number: [id number] Patient Account Number: [id number] Date of Birth/Gender: 05-11-1945 (73 y.o. M) Treating RN: Army Melia Primary Care Physician: [name] Other Clinician: Referring Physician: [name] Treating Physician/Extender: Sharalyn Ink in Treatment: 31 Education Assessment Education Provided To: Patient Education Topics Provided Wound/Skin Impairment: Handouts: Caring for Your Ulcer Methods: Demonstration, Explain/Verbal Responses: State content correctly Electronic Signature(s) Signed: 09/17/2019 4:06:31 PM By: Army Melia Entered By: Army Melia on 09/17/2019 15:45:45 [name] ([id number]) -------------------------------------------------------------------------------- Wound Assessment Details Patient Name: [name] Date of Service: 09/17/2019 3:00 PM Medical Record Number: [id number] Patient Account Number: [id number] Date of Birth/Sex: 1945-11-20 (73 y.o. M) Treating RN: Montey Hora Primary Care Derika Eckles: [name] Other  Clinician: Referring Rohil Lesch: [name] Treating Fiora Weill/Extender: [name], HOYT Weeks in Treatment: 31 Wound Status Wound Number: 5 Primary Diabetic Wound/Ulcer of the Lower Extremity Etiology: Wound Location: Left, Lateral Foot Wound Open Wounding Event: Pressure Injury Status: Date Acquired: 01/19/2019 Comorbid  Cataracts, Middle ear problems, Sleep Apnea, Weeks Of Treatment: 31 History: Hypertension, Type II Diabetes, Gout, Osteoarthritis, Clustered Wound: No Neuropathy Photos Wound Measurements Length: (cm) 0.9 % Reduc Width: (cm) 1 % Reduc Depth: (cm) 0.3 Epithel Area: (cm) 0.707 Tunnel Volume: (cm) 0.212 Underm tion in Area: -157.1% tion in Volume: -158.5% ialization: Small (1-33%) ing: No ining: No Wound Description Classification: Grade 2 Foul O Wound Margin: Flat and Intact Slough Exudate Amount: Medium Exudate Type: Serosanguineous Exudate Color: red, brown dor After Cleansing: No /Fibrino No Wound Bed Granulation Amount: Large (67-100%) Exposed Structure Granulation Quality: Pink Fascia Exposed: No Necrotic Amount: None Present (0%) Fat Layer (Subcutaneous Tissue) Exposed: Yes Tendon Exposed: No Muscle Exposed: No Joint Exposed: No Bone Exposed: No Treatment Notes Wound #5 (Left, Lateral Foot) Notes prisma, BFD, on foot Coverlet on toe [name], [name] ([id number]) Electronic Signature(s) Signed: 09/17/2019 4:03:27 PM By: Montey Hora Entered By: Montey Hora on 09/17/2019 15:14:15 [name] ([id number]) -------------------------------------------------------------------------------- Wound Assessment Details Patient Name: [name] Date of Service: 09/17/2019 3:00 PM Medical Record Number: [id number] Patient Account Number: [id number] Date of Birth/Sex: [birth year] (73 y.o. M) Treating RN: Montey Hora Primary Care Alphonsus Doyel: [name] Other Clinician: Referring Conway Fedora: [name] Treating Raymar Joiner/Extender: [name], HOYT Weeks in Treatment: 31 Wound Status Wound Number: 6 Primary Diabetic Wound/Ulcer of the Lower Extremity Etiology: Wound Location: Left Toe Great Wound Open Wounding Event: Trauma Status: Date Acquired: 09/09/2019 Comorbid Cataracts, Middle ear problems, Sleep Apnea, Weeks Of Treatment: 0 History:  Hypertension, Type II Diabetes, Gout, Osteoarthritis, Clustered Wound: No Neuropathy Photos Wound Measurements Length: (cm) 0.3 % Re Width: (cm) 0.3 % Re Depth: (cm) 0.1 Epit Area: (cm) 0.071 Tun Volume: (cm) 0.007 Und duction in Area: 74.2% duction in Volume: 74.1% helialization: Large (67-100%) neling: No ermining: No Wound Description Classification: Grade 1 Foul Wound Margin: Flat and Intact Slou Exudate Amount: Medium Exudate Type: Serous Exudate Color: amber Odor After Cleansing: No gh/Fibrino No Wound Bed Granulation Amount: Large (67-100%) Exposed Structure Granulation Quality: Pink Fascia Exposed: No Necrotic Amount: None Present (0%) Fat Layer (Subcutaneous Tissue) Exposed: Yes Tendon Exposed: No Muscle Exposed: No Joint Exposed: No Bone Exposed: No Treatment Notes Wound #6 (Left Toe Great) Notes prisma, BFD, on foot Coverlet on toe [name], [name] ([id number]) Electronic Signature(s) Signed: 09/17/2019 4:03:27 PM By: Montey Hora Entered By: Montey Hora on 09/17/2019 15:15:04 [name] ([id number]) -------------------------------------------------------------------------------- [place] Details Patient Name: [name] Date of Service: 09/17/2019 3:00 PM Medical Record Number: [id number] Patient Account Number: [id number] Date of Birth/Sex: [birth year] (73 y.o. M) Treating RN: Army Melia Primary Care Emalia Witkop: [name] Other Clinician: Referring Verbie Babic: [name] Treating Yania Bogie/Extender: [name], HOYT Weeks in Treatment: 31 Vital Signs Time Taken: 15:00 Temperature (F): 98.3 Height (in): 71 Pulse (bpm): 102 Weight (lbs): 205 Respiratory Rate (breaths/min): 18 Body Mass Index (BMI): 28.6 Blood Pressure (mmHg): 121/64 Reference Range: 80 - 120 mg / dl Electronic Signature(s) Signed: 09/17/2019 4:25:54 PM By: Lorine Bears RCP, RRT, CHT Entered By: Lorine Bears on 09/17/2019  15:03:39

## 2019-09-17 NOTE — Progress Notes (Signed)
Patient ID: [name], male   DOB: Jan 10, 1946, 74 y.o.   [name]: [id number]  This visit occurred during the SARS-CoV-2 public health emergency.  Safety protocols were in place, including screening questions prior to the visit, additional usage of staff PPE, and extensive cleaning of exam room while observing appropriate contact time as indicated for disinfecting solutions.   HPI: [name] is a 74 y.o.-year-old male, presenting for f/u for DM2, dx 1997, insulin-dependent since 2010, uncontrolled, with complications (CKD stage 3, PN, ED, TIA, DR). Last visit 4 months ago.  Reviewed HbA1c levels: Lab Results  Component Value Date   HGBA1C 12.0 (H) 04/07/2019   HGBA1C 11.6 (H) 01/03/2019   HGBA1C 12.5 (H) 04/13/2018   Previously on:  U500 insulin: - draw up to 20 units on the syringe before b'fast (100 units) - draw up to 10 (not 12) units on the syringe before lunch (60 units) - draw up to 20 (sometimes 25) units on the syringe before dinner (100 units)  He is now on Type of insulin Breakfast Lunch Dinner Bedtime  Humulin R 20-25 >> 35-45  20-25 >> 35-45 May take 20-25  Humulin N 30 >> 35-45  25  >> 35-45 May take 20-25  Continues to miss insulin doses.  -Ozempic 0.5 mg weekly-started 04/2018 -no GI side effects - just restarted it in 04/2019 after stopping in 10/2018 2/2 being in the doughnut hole... >> stopped 3 mo ago 2/2 price  Pt checks his sugars 4x a day:-  He has pain in fingers from neuropathy >> difficult for him to check but he is making a great effort to check so he can obtain a CGM: - am: 300s >> 130-140 >> 215-250 >> 420-430, HI (cantaloupe last night) - 2h after b'fast: n/c  - before lunch: 80, 114-289 >> 80s-460 >> 160 >> n/c  - 2h after lunch: n/c >> 300s >> n/c >>  240-260 - before dinner: 364 >> 198, 335 >> n/c - 2h after dinner: n/c - bedtime: 135-150 >> 200s-300 >> 370-450 - nighttime: 286 >> n/c >> 78 >> n/c >> 320-330 Lowest sugar was 72 >> 80s >> 78  >> 150 >> 220s.  he has hypoglycemia awareness at 100. Highest sugar was 460 >> 240 >> 400-500 >> HI.  Pt's meals are: - Breakfast: 3 pancakes with diet syrup >> oatmeal + dates and raisins - Lunch: tuna or cheese sandwich + chips >> ham or [place] breast sandwiches without bread and pickles - Dinner: veggies + starch  - Snacks: crackers, cantaloupe/melon or other fruit - including at night He stopped bread.  He lost ~60 lbs in last 2 years after the his wife's death.  -+ CKD, last BUN/creatinine:  Lab Results  Component Value Date   BUN 23 04/18/2019   CREATININE 1.50 04/18/2019  ACR on 12/12/2012.  On lisinopril. -+ HL; last set of lipids: Lab Results  Component Value Date   CHOL 172 01/03/2019   HDL 31.40 (L) 01/03/2019   LDLCALC 43 04/24/2009   LDLDIRECT 82.0 01/03/2019   TRIG (H) 01/03/2019    521.0 Triglyceride is over 400; calculations on Lipids are invalid.   CHOLHDL 5 01/03/2019  On Zocor 40. - last eye exam in 2021: + DR in [place]; [facility].  He has a history of cataract surgeries.  Before last visit, he had shingles in the left eye. -He has numbness and tingling in his feet. + footdrop L  -also has an ulcer  treated by wound care.  He has Alzheimer dementia and is on Wellbutrin, Namenda, Aricept.  Of note, he had a very low vitamin B12, under 200, in the past.  He is on B12 supplementation.  His wife died in 07/01/17 - PE.  ROS: Constitutional: no weight gain/no weight loss, no fatigue, no subjective hyperthermia, no subjective hypothermia Eyes: no blurry vision, no xerophthalmia ENT: no sore throat, no nodules palpated in neck, no dysphagia, no odynophagia, no hoarseness Cardiovascular: no CP/no SOB/no palpitations/no leg swelling Respiratory: no cough/no SOB/no wheezing Gastrointestinal: no N/no V/no D/no C/no acid reflux Musculoskeletal: no muscle aches/no joint aches Skin: no rashes, no hair loss Neurological: no tremors/+ numbness/+ tingling/no  dizziness  I reviewed pt's medications, allergies, PMH, social hx, family hx, and changes were documented in the history of present illness. Otherwise, unchanged from my initial visit note.  Past Medical History:  Diagnosis Date  . Allergic rhinitis due to pollen   . Chronic kidney disease, stage III (moderate) 12/19/2012  . Depression   . Diabetes mellitus ([place])   . Diverticulosis   . Erectile dysfunction associated with type 2 diabetes mellitus ([place]) 05/28/2015  . Former very heavy cigarette smoker (more than 40 per day) 10/07/2014  . GERD (gastroesophageal reflux disease)   . Gout   . Hyperlipidemia   . Hypertension   . Hypogonadism male 06/14/2012  . Iron deficiency   . Memory loss   . Osteoarthritis   . Personal history of colonic polyps    1996   Past Surgical History:  Procedure Laterality Date  . CARDIAC CATHETERIZATION  11/2011   [facility]  . CHOLECYSTECTOMY  1980  . ESOPHAGOGASTRODUODENOSCOPY (EGD) WITH PROPOFOL N/A 09/27/2017   Procedure: ESOPHAGOGASTRODUODENOSCOPY (EGD) WITH PROPOFOL;  Surgeon: Lin Landsman, MD;  Location: [place];  Service: Gastroenterology;  Laterality: N/A;  . EYE SURGERY Right 07/29/2016   cataract - Dr. Manuella Ghazi  . EYE SURGERY Left 08/23/2106   cataract - Dr. Manuella Ghazi  . TOTAL HIP ARTHROPLASTY  02-Jul-2007   Dr. Gladstone Lighter  . TOTAL HIP ARTHROPLASTY     Social History   Socioeconomic History  . Marital status: Widowed    Spouse name: Not on file  . Number of children: 4  . Years of education: 12th  . Highest education level: Not on file  Occupational History  . Occupation: Counsellor  . Occupation: retired Magazine features editor: [employer]  Tobacco Use  . Smoking status: Former Smoker    Packs/day: 3.00    Years: 35.00    Pack years: 105.00    Types: Cigarettes    Quit date: 04/12/2000    Years since quitting: 19.4  . Smokeless tobacco: Never Used  Substance and Sexual Activity  . Alcohol use: No  . Drug use: No  . Sexual activity:  Never  Other Topics Concern  . Not on file  Social History Narrative   No regular exercise   Caffeine use: yes, dt coke   Lives at home with his mother lives with him.   Wife Fraser Din recently deceased   Right-handed.   Social Determinants of Health   Financial Resource Strain: Low Risk   . Difficulty of Paying Living Expenses: Not hard at all  Food Insecurity: No Food Insecurity  . Worried About Charity fundraiser in the Last Year: Never true  . Ran Out of Food in the Last Year: Never true  Transportation Needs: No Transportation Needs  . Lack of Transportation (  Medical): No  . Lack of Transportation (Non-Medical): No  Physical Activity: Inactive  . Days of Exercise per Week: 0 days  . Minutes of Exercise per Session: 0 min  Stress: No Stress Concern Present  . Feeling of Stress : Not at all  Social Connections:   . Frequency of Communication with Friends and Family:   . Frequency of Social Gatherings with Friends and Family:   . Attends Religious Services:   . Active Member of Clubs or Organizations:   . Attends Archivist Meetings:   [name] Kitchen Marital Status:   Intimate Partner Violence: Not At Risk  . Fear of Current or Ex-Partner: No  . Emotionally Abused: No  . Physically Abused: No  . Sexually Abused: No   Current Outpatient Medications on File Prior to Visit  Medication Sig Dispense Refill  . acetaminophen (QC ACETAMINOPHEN 8HR ARTH PAIN) 650 MG CR tablet Take 650 mg by mouth every 8 (eight) hours as needed for pain.    [name] Kitchen ampicillin (PRINCIPEN) 500 MG capsule Take 500 mg by mouth daily.    . Ascorbic Acid (VITAMIN C) 1000 MG tablet Take 1,000 mg by mouth daily.    [name] Kitchen aspirin EC 81 MG tablet Take 81 mg by mouth at bedtime.    [name] Kitchen atenolol (TENORMIN) 50 MG tablet TAKE 1 TABLET BY MOUTH EVERY DAY 90 tablet 1  . cholecalciferol (VITAMIN D) 1000 UNITS tablet Take 1,000 Units by mouth daily.      . colchicine 0.6 MG tablet Take 1 tablet (0.6 mg total) by mouth 2 (two) times  daily. 60 tablet 2  . donepezil (ARICEPT) 10 MG tablet TAKE 1 TABLET BY MOUTH AT BEDTIME 90 tablet 3  . doxazosin (CARDURA) 8 MG tablet TAKE ONE-HALF TABLET AT BEDTIME 45 tablet 3  . feeding supplement, GLUCERNA SHAKE, (GLUCERNA SHAKE) LIQD Take 237 mLs by mouth 3 (three) times daily between meals. 10000 mL 0  . finasteride (PROSCAR) 5 MG tablet Take 1 tablet (5 mg total) by mouth daily. 30 tablet 2  . fluconazole (DIFLUCAN) 100 MG tablet Take 1 tablet (100 mg total) by mouth daily. 5 tablet 0  . fluticasone (FLONASE) 50 MCG/ACT nasal spray PLACE 2 SPRAYS IN EACH NOSTRIL DAILY 48 mL 3  . ibuprofen (ADVIL,MOTRIN) 200 MG tablet Take 200-400 mg by mouth every 6 (six) hours as needed for headache (pain).    . indomethacin (INDOCIN) 50 MG capsule Take 1 capsule (50 mg total) by mouth 2 (two) times daily with a meal. 30 capsule 0  . insulin NPH Human (HUMULIN N,NOVOLIN N) 100 UNIT/ML injection Inject 20 Units into the skin 2 (two) times daily before a meal.  10 mL   . insulin regular (NOVOLIN R,HUMULIN R) 100 units/mL injection Inject 0.2 mLs (20 Units total) into the skin 2 (two) times daily before a meal. 10 mL   . lisinopril (ZESTRIL) 10 MG tablet TAKE 1 TABLET BY MOUTH EVERY DAY 90 tablet 3  . memantine (NAMENDA) 10 MG tablet Take 1 tablet (10 mg total) by mouth 2 (two) times daily. 180 tablet 3  . montelukast (SINGULAIR) 10 MG tablet Take 1 tablet (10 mg total) by mouth at bedtime. 30 tablet 3  . nutrition supplement, JUVEN, (JUVEN) PACK Take 1 packet by mouth 2 (two) times daily between meals. 30 packet 0  . omeprazole (PRILOSEC) 40 MG capsule Take 1 capsule (40 mg total) by mouth 2 (two) times daily before a meal. 180 capsule 1  . ondansetron (  ZOFRAN) 4 MG tablet Take 1 tablet (4 mg total) by mouth every 8 (eight) hours as needed for nausea or vomiting. 30 tablet 1  . ondansetron (ZOFRAN-ODT) 8 MG disintegrating tablet Take 1 tablet (8 mg total) by mouth 2 (two) times daily. 60 tablet 2  .  OZEMPIC, 0.25 OR 0.5 MG/DOSE, 2 MG/1.5ML SOPN INJECT 0.5 MG INTO THE SKIN ONCE A WEEK. 3 pen 2  . predniSONE (DELTASONE) 10 MG tablet Take 1 tablet (10 mg total) by mouth daily with breakfast. 2 tabs let daily for 5 days then 1 tablet daily for 5 days. 15 tablet 0  . sildenafil (REVATIO) 20 MG tablet Generic Revatio / Sildanefil 20 mg. 2 - 5 tabs 30 mins prior to intercourse. 10 tablet 11  . simvastatin (ZOCOR) 40 MG tablet TAKE 1 TABLET BY MOUTH AT BEDTIME 90 tablet 2  . Testosterone Cypionate 150 MG/ML SOLN Inject 1 mL into the muscle every 14 (fourteen) days. 6 mL 1  . valACYclovir (VALTREX) 1000 MG tablet Take 1,000 mg by mouth 3 (three) times daily.    [name] Kitchen venlafaxine XR (EFFEXOR-XR) 75 MG 24 hr capsule TAKE 1 CAPSULE BY MOUTH EVERY MORNING AND 2 CAPSULES AT BEDTIME 270 capsule 1   No current facility-administered medications on file prior to visit.   Allergies  Allergen Reactions  . Tetracycline Itching and Rash   Family History  Problem Relation Age of Onset  . Diabetes Mother   . Alzheimer's disease Mother   . Diabetes Father   . Lung cancer Father        Age 4  . Stroke Father   . Heart attack Father   . Lung cancer Sister        lung  . Heart disease Maternal Grandfather   . COPD Sister   . Heart attack Sister   . Heart disease Sister     PE: BP 130/60   Pulse 82   Ht 5' 11.5" (1.816 m)   Wt 199 lb (90.3 kg)   BMI 27.37 kg/m  Body mass index is 27.37 kg/m. Wt Readings from Last 3 Encounters:  09/17/19 199 lb (90.3 kg)  07/18/19 202 lb 4 oz (91.7 kg)  05/14/19 202 lb 6.4 oz (91.8 kg)   Constitutional: overweight, in NAD Eyes: PERRLA, EOMI, no exophthalmos ENT: moist mucous membranes, no thyromegaly, no cervical lymphadenopathy Cardiovascular: RRR, No MRG Respiratory: CTA B Gastrointestinal: abdomen soft, NT, ND, BS+ Musculoskeletal: no deformities, strength intact in all 4 Skin: moist, warm, no rashes Neurological: no tremor with outstretched hands, DTR  normal in all 4  ASSESSMENT: 1. DM2, insulin-dependent, uncontrolled, with complications - noncompliance with visits, insulin doses, CBG checks - CKD stage 3 - TIA - DR - PN - ED  2. HTG  3.  Overweight  PLAN:  1. Patient with history of longstanding, uncontrolled, type 2 diabetes, on basal-bolus insulin regimen with Humulin N and R due to price.  Unfortunately, he is still missing many injections as he forgets to take them - Alzheimer's dementia.  Also, he cannot check frequent blood sugars due to neuropathy in fingers.  A CGM was not covered by his insurance in the past.  Before last visit, he was off Ozempic due to being in the donut hole, and then HbA1c was even higher than before, at 12%.  Right before the last visit, he restarted it and sugars improved, but they were still high.  Therefore, we increase his regular insulin with larger meals. We did  not increase Ozempic then due to recent weight loss. - at this visit, he is off Ozempic again 2/2 Price  - sugars are higher and he increased his insulin doses - However, he continues to eat sweets/fruit in the middle of the night (strongly advised him to stop) and also continues to eat dried fruit with his oatmeal in the morning (advised him that this will greatly increase his blood sugars so he needs to stop these, also).  He refuses referral to nutrition today - We will continue with the higher insulin doses that he has been using, hoping that in the setting of improved diet, his sugars will improve.  However, he would greatly benefit from restarting Ozempic and also from trying to switch back to U500 insulin.  I am referring him back to [facility] to hopefully get some help covering these 2 prescriptions. -Also, we sent a prescription for freestyle libre CGM to his pharmacy.  He is not checking 4 times a day, with great effort, since he has neuropathy and has pain in his fingers.  I really hoping that he would qualify for this. - I advised him  to: Patient Instructions   Please continue: Type of insulin Breakfast Lunch Dinner  Humulin R 40-45 25-30 40-45  Humulin N 40-45 - 40-45   STOP DRIED FRUIT!  STOP EATING AT NIGHT!  Please try again to start Ozempic 0.5 mg weekly.  If possible, Try to switch from N and R insulin to U500 insulin: - before b'fast: 80-90 units - before lunch: 60 units - before dinner: 80-90 units  Please return in 3 months with your sugar log.   - we checked his HbA1c: 13% (even higher) - advised to check sugars at different times of the day - 4x a day, rotating check times - advised for yearly eye exams >> he is UTD - return to clinic in 3-4 months  2. HTG -Reviewed latest lipid panel from 12/2018: Triglycerides still high, LDL above target of less than 70, HDL low: Lab Results  Component Value Date   CHOL 172 01/03/2019   HDL 31.40 (L) 01/03/2019   LDLCALC 43 04/24/2009   LDLDIRECT 82.0 01/03/2019   TRIG (H) 01/03/2019    521.0 Triglyceride is over 400; calculations on Lipids are invalid.   CHOLHDL 5 01/03/2019  -Continues the statin without side effects  3.  Overweight -Unfortunately, he could not continue Ozempic, which would have helped with weight loss -Before last visit, he lost 22 pounds in the previous year and lost approximately 3 pounds since last visit  Philemon Kingdom, MD PhD [facility] Endocrinology

## 2019-09-19 ENCOUNTER — Other Ambulatory Visit: Payer: Self-pay

## 2019-09-19 ENCOUNTER — Telehealth: Payer: Self-pay | Admitting: Internal Medicine

## 2019-09-19 DIAGNOSIS — IMO0002 Reserved for concepts with insufficient information to code with codable children: Secondary | ICD-10-CM

## 2019-09-19 MED ORDER — PEN NEEDLES 31G X 5 MM MISC: 1.0000 | Freq: Three times a day (TID) | 2 refills | Status: DC

## 2019-09-19 NOTE — Telephone Encounter (Signed)
Outpatient Medication Detail   Disp Refills Start End   Insulin Pen Needle (PEN NEEDLES) 31G X 5 MM MISC 90 each 2 09/19/2019    Sig - Route: 1 each by Does not apply route 3 (three) times daily. E11.42 - Does not apply   Sent to pharmacy as: Insulin Pen Needle (PEN NEEDLES) 31G X 5 MM Misc   E-Prescribing Status: Receipt confirmed by pharmacy (09/19/2019  3:12 PM EDT)

## 2019-09-19 NOTE — Telephone Encounter (Signed)
Patient called stating he got his Humulin R Kwikpen but he needs pen needles.  Pharmacy:  [id number] - [place], [address] Phone:  [phone]  Fax:  [phone]

## 2019-09-20 ENCOUNTER — Encounter: Payer: Self-pay | Admitting: Family Medicine

## 2019-09-20 ENCOUNTER — Other Ambulatory Visit: Payer: Self-pay

## 2019-09-20 ENCOUNTER — Ambulatory Visit (INDEPENDENT_AMBULATORY_CARE_PROVIDER_SITE_OTHER): Payer: Medicare Other | Admitting: Family Medicine

## 2019-09-20 ENCOUNTER — Other Ambulatory Visit: Payer: Self-pay | Admitting: Family Medicine

## 2019-09-20 VITALS — BP 140/62 | HR 88 | Temp 98.5°F | Ht 71.5 in | Wt 201.8 lb

## 2019-09-20 DIAGNOSIS — IMO0002 Reserved for concepts with insufficient information to code with codable children: Secondary | ICD-10-CM

## 2019-09-20 DIAGNOSIS — J309 Allergic rhinitis, unspecified: Secondary | ICD-10-CM

## 2019-09-20 DIAGNOSIS — E1165 Type 2 diabetes mellitus with hyperglycemia: Secondary | ICD-10-CM

## 2019-09-20 DIAGNOSIS — R0982 Postnasal drip: Secondary | ICD-10-CM | POA: Diagnosis not present

## 2019-09-20 DIAGNOSIS — E1142 Type 2 diabetes mellitus with diabetic polyneuropathy: Secondary | ICD-10-CM

## 2019-09-20 MED ORDER — MONTELUKAST SODIUM 10 MG PO TABS: 10.0000 mg | ORAL_TABLET | Freq: Every day | ORAL | 3 refills | Status: AC

## 2019-09-20 MED ORDER — PEN NEEDLES 31G X 5 MM MISC: 1.0000 | Freq: Three times a day (TID) | 2 refills | Status: AC

## 2019-09-20 NOTE — Patient Instructions (Addendum)
Stop remaining prednisone.  Increase water. Can temporarily increase Novolin N by 5 Units twice daily.  Continue allegra.[name] Kitchen add Singulair at bedtime for allergies.  If still no benefit.. change allegra to Xyzal.

## 2019-09-20 NOTE — Assessment & Plan Note (Signed)
No further sinus pressure.  Will try trial of singulair( pt states he is not taking AND Possible change from Allegra to [place])

## 2019-09-20 NOTE — Progress Notes (Signed)
Chief Complaint  Patient presents with  . [place] Drainage    History of Present Illness: HPI    74 year old male  Pt of Dr. Lillie Fragmin with history of  HTN. Allergic rhinitis presents for follow up continued nasal drainage.   he was seen by myself on 5/27 for similar... treated with prednisone taper  He reports improved sinus pressure.. but still clear post nasal drip.  No further scratchy throat.  Minimal runny nose.   Mild left ear pain.   no sneeze, some itchy eyes.  No fever, no SOB, no cough.  Using Allegra and mucinex  Since last OV.  Flonase 2 sprays per nostril and  Netty pot.   He has noted some tounge irritation and coating white on tounge at night.. can scrape Prednisone shot CBGs up to 300-400s, once off meter. Saw ENDO 4 days ago   No history of chronic lung disease.   Immunization History  Administered Date(s) Administered  . Fluad Quad(high Dose 65+) 12/20/2018  . Influenza Split 12/29/2010  . Influenza Whole 01/27/2009, 02/05/2010  . Influenza, High Dose Seasonal PF 02/03/2017, 12/21/2018  . Influenza, Seasonal, Injecte, Preservative Fre 06/14/2012  . Influenza,inj,Quad PF,6+ Mos 12/18/2012, 12/19/2013, 03/19/2015, 01/05/2016, 01/16/2018  . Influenza,inj,quad, With Preservative 01/10/2017  . PFIZER SARS-COV-2 Vaccination 06/15/2019, 07/18/2019  . Pneumococcal Conjugate-13 12/19/2013  . Pneumococcal Polysaccharide-23 10/25/2008, 08/26/2015  . Td 04/13/2007       This visit occurred during the SARS-CoV-2 public health emergency.  Safety protocols were in place, including screening questions prior to the visit, additional usage of staff PPE, and extensive cleaning of exam room while observing appropriate contact time as indicated for disinfecting solutions.   COVID 19 screen:  No recent travel or known exposure to COVID19 The patient denies respiratory symptoms of COVID 19 at this time. The importance of social distancing was discussed today.      Review of Systems  Constitutional: Negative for chills and fever.  HENT: Negative for congestion and ear pain.   Eyes: Negative for pain and redness.  Respiratory: Negative for cough and shortness of breath.   Cardiovascular: Negative for chest pain, palpitations and leg swelling.  Gastrointestinal: Negative for abdominal pain, blood in stool, constipation, diarrhea, nausea and vomiting.  Genitourinary: Negative for dysuria.  Musculoskeletal: Negative for falls and myalgias.  Skin: Negative for rash.  Neurological: Negative for dizziness.  Psychiatric/Behavioral: Negative for depression. The patient is not nervous/anxious.       Past Medical History:  Diagnosis Date  . Allergic rhinitis due to pollen   . Chronic kidney disease, stage III (moderate) 12/19/2012  . Depression   . Diabetes mellitus ([place])   . Diverticulosis   . Erectile dysfunction associated with type 2 diabetes mellitus ([place]) 05/28/2015  . Former very heavy cigarette smoker (more than 40 per day) 10/07/2014  . GERD (gastroesophageal reflux disease)   . Gout   . Hyperlipidemia   . Hypertension   . Hypogonadism male 06/14/2012  . Iron deficiency   . Memory loss   . Osteoarthritis   . Personal history of colonic polyps    1996    reports that he quit smoking about 19 years ago. His smoking use included cigarettes. He has a 105.00 pack-year smoking history. He has never used smokeless tobacco. He reports that he does not drink alcohol and does not use drugs.   Current Outpatient Medications:  .  acetaminophen (QC ACETAMINOPHEN 8HR ARTH PAIN) 650 MG CR tablet, Take 650 mg by  mouth every 8 (eight) hours as needed for pain., Disp: , Rfl:  .  ampicillin (PRINCIPEN) 500 MG capsule, Take 500 mg by mouth daily., Disp: , Rfl:  .  Ascorbic Acid (VITAMIN C) 1000 MG tablet, Take 1,000 mg by mouth daily., Disp: , Rfl:  .  aspirin EC 81 MG tablet, Take 81 mg by mouth at bedtime., Disp: , Rfl:  .  atenolol (TENORMIN) 50 MG  tablet, TAKE 1 TABLET BY MOUTH EVERY DAY, Disp: 90 tablet, Rfl: 1 .  cholecalciferol (VITAMIN D) 1000 UNITS tablet, Take 1,000 Units by mouth daily.  , Disp: , Rfl:  .  colchicine 0.6 MG tablet, Take 1 tablet (0.6 mg total) by mouth 2 (two) times daily., Disp: 60 tablet, Rfl: 2 .  donepezil (ARICEPT) 10 MG tablet, TAKE 1 TABLET BY MOUTH AT BEDTIME, Disp: 90 tablet, Rfl: 3 .  doxazosin (CARDURA) 8 MG tablet, TAKE ONE-HALF TABLET AT BEDTIME, Disp: 45 tablet, Rfl: 3 .  feeding supplement, GLUCERNA SHAKE, (GLUCERNA SHAKE) LIQD, Take 237 mLs by mouth 3 (three) times daily between meals., Disp: 10000 mL, Rfl: 0 .  finasteride (PROSCAR) 5 MG tablet, Take 1 tablet (5 mg total) by mouth daily., Disp: 30 tablet, Rfl: 2 .  fluconazole (DIFLUCAN) 100 MG tablet, Take 1 tablet (100 mg total) by mouth daily., Disp: 5 tablet, Rfl: 0 .  fluticasone (FLONASE) 50 MCG/ACT nasal spray, PLACE 2 SPRAYS IN EACH NOSTRIL DAILY, Disp: 48 mL, Rfl: 3 .  ibuprofen (ADVIL,MOTRIN) 200 MG tablet, Take 200-400 mg by mouth every 6 (six) hours as needed for headache (pain)., Disp: , Rfl:  .  indomethacin (INDOCIN) 50 MG capsule, Take 1 capsule (50 mg total) by mouth 2 (two) times daily with a meal., Disp: 30 capsule, Rfl: 0 .  insulin NPH Human (HUMULIN N,NOVOLIN N) 100 UNIT/ML injection, Inject 20 Units into the skin 2 (two) times daily before a meal. , Disp: 10 mL, Rfl:  .  Insulin Pen Needle (PEN NEEDLES) 31G X 5 MM MISC, 1 each by Does not apply route 3 (three) times daily. E11.42, Disp: 90 each, Rfl: 2 .  insulin regular (NOVOLIN R,HUMULIN R) 100 units/mL injection, Inject 0.2 mLs (20 Units total) into the skin 2 (two) times daily before a meal., Disp: 10 mL, Rfl:  .  insulin regular human CONCENTRATED (HUMULIN R U-500 KWIKPEN) 500 UNIT/ML kwikpen, Inject under skin 80-90 units before b'fast, 60 units before lunch, 80-90 units vefore dinner, Disp: 6 pen, Rfl: 5 .  lisinopril (ZESTRIL) 10 MG tablet, TAKE 1 TABLET BY MOUTH EVERY DAY,  Disp: 90 tablet, Rfl: 3 .  memantine (NAMENDA) 10 MG tablet, Take 1 tablet (10 mg total) by mouth 2 (two) times daily., Disp: 180 tablet, Rfl: 3 .  montelukast (SINGULAIR) 10 MG tablet, Take 1 tablet (10 mg total) by mouth at bedtime., Disp: 30 tablet, Rfl: 3 .  nutrition supplement, JUVEN, (JUVEN) PACK, Take 1 packet by mouth 2 (two) times daily between meals., Disp: 30 packet, Rfl: 0 .  omeprazole (PRILOSEC) 40 MG capsule, Take 1 capsule (40 mg total) by mouth 2 (two) times daily before a meal., Disp: 180 capsule, Rfl: 1 .  ondansetron (ZOFRAN) 4 MG tablet, Take 1 tablet (4 mg total) by mouth every 8 (eight) hours as needed for nausea or vomiting., Disp: 30 tablet, Rfl: 1 .  ondansetron (ZOFRAN-ODT) 8 MG disintegrating tablet, Take 1 tablet (8 mg total) by mouth 2 (two) times daily., Disp: 60 tablet, Rfl: 2 .  OZEMPIC, 0.25 OR 0.5 MG/DOSE, 2 MG/1.5ML SOPN, INJECT 0.5 MG INTO THE SKIN ONCE A WEEK., Disp: 3 pen, Rfl: 2 .  predniSONE (DELTASONE) 10 MG tablet, Take 1 tablet (10 mg total) by mouth daily with breakfast. 2 tabs let daily for 5 days then 1 tablet daily for 5 days., Disp: 15 tablet, Rfl: 0 .  sildenafil (REVATIO) 20 MG tablet, Generic Revatio / Sildanefil 20 mg. 2 - 5 tabs 30 mins prior to intercourse., Disp: 10 tablet, Rfl: 11 .  simvastatin (ZOCOR) 40 MG tablet, TAKE 1 TABLET BY MOUTH AT BEDTIME, Disp: 90 tablet, Rfl: 2 .  Testosterone Cypionate 150 MG/ML SOLN, Inject 1 mL into the muscle every 14 (fourteen) days., Disp: 6 mL, Rfl: 1 .  valACYclovir (VALTREX) 1000 MG tablet, Take 1,000 mg by mouth 3 (three) times daily., Disp: , Rfl:  .  venlafaxine XR (EFFEXOR-XR) 75 MG 24 hr capsule, TAKE 1 CAPSULE BY MOUTH EVERY MORNING AND 2 CAPSULES AT BEDTIME, Disp: 270 capsule, Rfl: 1   Observations/Objective: Blood pressure 140/62, pulse 88, temperature 98.5 F (36.9 C), temperature source Temporal, height 5' 11.5" (1.816 m), weight 201 lb 12 oz (91.5 kg), SpO2 100 %.  Physical  Exam Constitutional:      Appearance: He is well-developed.  HENT:     Head: Normocephalic.     Right Ear: Hearing normal.     Left Ear: Hearing normal.     Nose: Nose normal.  Neck:     Thyroid: No thyroid mass or thyromegaly.     Vascular: No carotid bruit.     Trachea: Trachea normal.  Cardiovascular:     Rate and Rhythm: Normal rate and regular rhythm.     Pulses: Normal pulses.     Heart sounds: Heart sounds not distant. No murmur heard.  No friction rub. No gallop.      Comments: No peripheral edema Pulmonary:     Effort: Pulmonary effort is normal. No respiratory distress.     Breath sounds: Normal breath sounds.  Skin:    General: Skin is warm and dry.     Findings: No rash.  Psychiatric:        Speech: Speech normal.        Behavior: Behavior normal.        Thought Content: Thought content normal.      Assessment and Plan    Allergic rhinitis with postnasal drip No further sinus pressure.  Will try trial of singulair( pt states he is not taking AND Possible change from Allegra to Xyzal)  Uncontrolled type 2 diabetes mellitus with peripheral neuropathy ([place]) Reviewed last ENDO note.  Stop prednisone. If CBGs not coming down.. can temporarily increase Novolin N up  5 units twice dialy.    Eliezer Lofts, MD

## 2019-09-20 NOTE — Assessment & Plan Note (Signed)
Reviewed last ENDO note.  Stop prednisone. If CBGs not coming down.. can temporarily increase Novolin N up  5 units twice dialy.

## 2019-09-21 ENCOUNTER — Telehealth: Payer: Self-pay

## 2019-09-21 NOTE — Telephone Encounter (Signed)
Fax from [place] that patient approved for the [employer] CGM system and they have shipped him the supplies.

## 2019-09-21 NOTE — Telephone Encounter (Signed)
Referral request faxed to [facility] for a CGM.

## 2019-09-22 DIAGNOSIS — Z794 Long term (current) use of insulin: Secondary | ICD-10-CM | POA: Diagnosis not present

## 2019-09-22 DIAGNOSIS — E1142 Type 2 diabetes mellitus with diabetic polyneuropathy: Secondary | ICD-10-CM | POA: Diagnosis not present

## 2019-09-24 ENCOUNTER — Encounter: Payer: Medicare Other | Admitting: Physician Assistant

## 2019-09-24 ENCOUNTER — Telehealth: Payer: Self-pay | Admitting: Nutrition

## 2019-09-24 ENCOUNTER — Other Ambulatory Visit: Payer: Self-pay

## 2019-09-24 DIAGNOSIS — M21372 Foot drop, left foot: Secondary | ICD-10-CM | POA: Diagnosis not present

## 2019-09-24 DIAGNOSIS — L97522 Non-pressure chronic ulcer of other part of left foot with fat layer exposed: Secondary | ICD-10-CM | POA: Diagnosis not present

## 2019-09-24 DIAGNOSIS — E11621 Type 2 diabetes mellitus with foot ulcer: Secondary | ICD-10-CM | POA: Diagnosis not present

## 2019-09-24 DIAGNOSIS — E1142 Type 2 diabetes mellitus with diabetic polyneuropathy: Secondary | ICD-10-CM | POA: Diagnosis not present

## 2019-09-24 NOTE — Telephone Encounter (Signed)
Spoke with patient.  He does not have his [name] yet.  He was given my name and number to call when he gets this in for a training appointment

## 2019-09-26 ENCOUNTER — Ambulatory Visit (INDEPENDENT_AMBULATORY_CARE_PROVIDER_SITE_OTHER): Payer: Medicare Other | Admitting: *Deleted

## 2019-09-26 ENCOUNTER — Other Ambulatory Visit: Payer: Self-pay | Admitting: *Deleted

## 2019-09-26 ENCOUNTER — Encounter: Payer: Self-pay | Admitting: *Deleted

## 2019-09-26 ENCOUNTER — Telehealth: Payer: Self-pay | Admitting: *Deleted

## 2019-09-26 DIAGNOSIS — E291 Testicular hypofunction: Secondary | ICD-10-CM

## 2019-09-26 MED ORDER — NYSTATIN 100000 UNIT/ML MT SUSP: 5.0000 mL | Freq: Four times a day (QID) | OROMUCOSAL | 0 refills | Status: AC

## 2019-09-26 MED ORDER — TESTOSTERONE CYPIONATE 200 MG/ML IM SOLN
150.0000 mg | Freq: Once | INTRAMUSCULAR | Status: AC
  Administered 2019-09-26: 150 mg via INTRAMUSCULAR

## 2019-09-26 NOTE — Telephone Encounter (Signed)
[name] notified by telephone that a Rx for Nystatin has been sent to his pharmacy.

## 2019-09-26 NOTE — Telephone Encounter (Signed)
done

## 2019-09-26 NOTE — Patient Outreach (Signed)
[facility]) Care Management  09/26/2019  GEOGE LAWRANCE [birth year] [id number]   Referral Date: 6/7 Referral Source: MD office Referral Reason: financial problems - cannot afford medications  Insurance: [facility]   Outreach attempt #1, successful, identity verified.  This care manager introduced self and stated purpose of call.  [facility] care management services explained.  Social: Member report living with his mother, he is her caregiver.  Able to perform ADL's independently.  He is able to to cook and clean the home, uses cane and walker for ambulation.  Report having foot wound that has been slow to heal, wears a brace and is being seen in the wound care center.    Conditions: Per chart, has history of HTN, OSA, DM (A1C - 13), CKD, Osteoarthritis, and hypertriglyceridemia.  Report he does check his blood sugars 3-4 times a day, today it was 210, yesterday was 114 and 86.  State he manages his diet with mostly vegetables, named green beans, potatoes, peas, and carrots. Advised that most of these could contain more starch then he is aware, verbalizes understanding and agrees to more information on diabetic diet.  Medications: Reviewed with member, state his main financial concern is being able to pay for the Ozempic, which he is not taking right now due to cost.  He is only taking U-500, 70-80 units for breakfast and dinner and 50 units for lunch.    Appointments: Had office visit with PCP on 6/10, next appointment for wellness visit on 9/29.  Was seen by endocrinologist on 6/7, follow up on 10/18.  Next visit with wound center on 6/28.  He is able to drive himself to all appointments.    Denies any urgent concerns, encouraged to contact this care manager with questions.  Plan: RN CM will place referral to [place] team and follow up within the next 2 weeks.  Fall Risk  09/26/2019 01/03/2019 12/26/2017 12/09/2016 10/29/2016  Falls in the past year? 1 1 Yes Yes Yes  Comment - -  multiple falls (7) due to balance issues; denies injury - [facility] Telephone Survey: data to providers prior to load  Number falls in past yr: 0 1 2 or more 2 or more 2 or more  Comment - - - - [facility] Telephone Survey Actual Response = 7  Injury with Fall? 0 0 No Yes Yes  Risk Factor Category  - - High Fall Risk - -  Risk for fall due to : - History of fall(s);Impaired mobility;Impaired balance/gait;Medication side effect Impaired balance/gait;Impaired mobility;History of fall(s) Medication side effect -  Follow up Falls prevention discussed Falls evaluation completed;Falls prevention discussed - - -   Depression screen [facility] 2/9 09/26/2019 01/03/2019 12/26/2017 08/03/2017 12/09/2016  Decreased Interest 0 0 3 3 1   Down, Depressed, Hopeless 0 0 2 2 1   PHQ - 2 Score 0 0 5 5 2   Altered sleeping - 0 3 3 1   Tired, decreased energy - 0 3 3 1   Change in appetite - 0 1 1 0  Feeling bad or failure about yourself  - 0 0 0 0  Trouble concentrating - 0 0 1 0  Moving slowly or fidgety/restless - 0 0 0 2  Suicidal thoughts - 0 0 0 0  PHQ-9 Score - 0 12 13 6   Difficult doing work/chores - Not difficult at all Somewhat difficult Not difficult at all Somewhat difficult  Some recent data might be hidden   [facility] CM Care Plan Problem One  Most Recent Value  Care Plan Problem One Knowledge deficit regarding diabetes management as evidenced by elevated A1C  Role Documenting the Problem One Care Management [place] for Problem One Active  [facility] Long Term Goal  Member's A1C will be decreased to </= 10 within the next 3 months  [facility]N Long Term Goal Start Date 09/26/19  Interventions for Problem One Long Term Goal Diabetes management and complications of uncontrolled diabetes discussed with member.  EMMI education for diabetes assigned and mailed to member.  [facility] CM Short Term Goal #1  Member will report plan to afford medications within the next 2 weeks  [facility]N CM Short Term Goal #1 Start Date 09/26/19   Interventions for Short Term Goal #1 Medications reviewed with member, referral placed to [place] team  [facility] CM Short Term Goal #2  Member will report foot wound healing within the next 4 weeks  THN CM Short Term Goal #2 Start Date 09/26/19  Interventions for Short Term Goal #2 Member educated on the importance of foot care and controlling diabetes in relation to wound healing.     [name], [place], MSN [place] [phone]

## 2019-09-26 NOTE — Progress Notes (Signed)
[name] ([id number]) Visit Report for 09/24/2019 Arrival Information Details Patient Name: [name] Date of Service: 09/24/2019 10:15 AM Medical Record Number: [id number] Patient Account Number: [id number] Date of Birth/Sex: 1945/10/12 (74 y.o. M) Treating RN: Montey Hora Primary Care Bill Yohn: [name] Other Clinician: Referring Marveen Donlon: [name] Treating Merriel Zinger/Extender: Melburn Hake, HOYT Weeks in Treatment: 65 Visit Information History Since Last Visit Added or deleted any medications: No Patient Arrived: Ambulatory Has Dressing in Place as Prescribed: Yes Arrival Time: 10:26 Pain Present Now: No Accompanied By: self Transfer Assistance: None Patient Identification Verified: Yes Secondary Verification Process Completed: Yes Patient Has Alerts: Yes Patient Alerts: DMII Electronic Signature(s) Signed: 09/24/2019 12:43:42 PM By: Montey Hora Entered By: Montey Hora on 09/24/2019 10:27:17 [name] ([id number]) -------------------------------------------------------------------------------- Encounter Discharge Information Details Patient Name: [name] Date of Service: 09/24/2019 10:15 AM Medical Record Number: [id number] Patient Account Number: [id number] Date of Birth/Sex: 02/07/1946 (73 y.o. M) Treating RN: Cornell Barman Primary Care Adaya Garmany: [name] Other Clinician: Referring Armanda Forand: [name] Treating Marijose Curington/Extender: Melburn Hake, HOYT Weeks in Treatment: 55 Encounter Discharge Information Items Post Procedure Vitals Discharge Condition: Stable Temperature (F): 98.7 Ambulatory Status: Ambulatory Pulse (bpm): 84 Discharge Destination: Home Respiratory Rate (breaths/min): 16 Transportation: Private Auto Blood Pressure (mmHg): 148/74 Accompanied By: self Schedule Follow-up Appointment: Yes Clinical Summary of Care: Electronic Signature(s) Signed: 09/26/2019 5:04:05 PM By: Gretta Cool, BSN, RN, CWS, Kim  RN, BSN Entered By: Gretta Cool, BSN, RN, CWS, Kim on 09/24/2019 10:56:08 [name] ([id number]) -------------------------------------------------------------------------------- Lower Extremity Assessment Details Patient Name: [name] Date of Service: 09/24/2019 10:15 AM Medical Record Number: [id number] Patient Account Number: [id number] Date of Birth/Sex: 13-Mar-1946 (73 y.o. M) Treating RN: Montey Hora Primary Care Rawn Quiroa: [name] Other Clinician: Referring Earl Zellmer: [name] Treating Jehad Bisono/Extender: [name], HOYT Weeks in Treatment: 32 Edema Assessment Assessed: [Left: No] [Right: No] Edema: [Left: N] [Right: o] Vascular Assessment Pulses: Dorsalis Pedis Palpable: [Left:Yes] Electronic Signature(s) Signed: 09/24/2019 12:43:42 PM By: Montey Hora Entered By: Montey Hora on 09/24/2019 10:33:57 [name] ([id number]) -------------------------------------------------------------------------------- Multi Wound Chart Details Patient Name: [name] Date of Service: 09/24/2019 10:15 AM Medical Record Number: [id number] Patient Account Number: [id number] Date of Birth/Sex: Jul 05, 1945 (73 y.o. M) Treating RN: Cornell Barman Primary Care Abria Vannostrand: [name] Other Clinician: Referring Onnie Hatchel: [name] Treating Graig Hessling/Extender: Melburn Hake, HOYT Weeks in Treatment: 32 Vital Signs Height(in): 71 Pulse(bpm): 36 Weight(lbs): 205 Blood Pressure(mmHg): 148/74 Body Mass Index(BMI): 29 Temperature(F): 98.3 Respiratory Rate(breaths/min): 16 Photos: [N/A:N/A] Wound Location: Left, Lateral Foot Left Toe Great N/A Wounding Event: Pressure Injury Trauma N/A Primary Etiology: Diabetic Wound/Ulcer of the Lower Diabetic Wound/Ulcer of the Lower N/A Extremity Extremity Comorbid History: Cataracts, Middle ear problems, Cataracts, Middle ear problems, N/A Sleep Apnea, Hypertension, Type II Sleep Apnea, Hypertension, Type  II Diabetes, Gout, Osteoarthritis, Diabetes, Gout, Osteoarthritis, Neuropathy Neuropathy Date Acquired: 01/19/2019 09/09/2019 N/A Weeks of Treatment: 32 1 N/A Wound Status: Open Open N/A Measurements L x W x D (cm) 0.8x1x0.1 0.1x0.1x0.1 N/A Area (cm) : 0.628 0.008 N/A Volume (cm) : 0.063 0.001 N/A % Reduction in Area: -128.40% 97.10% N/A % Reduction in Volume: 23.20% 96.30% N/A Classification: Grade 2 Grade 1 N/A Exudate Amount: Medium Medium N/A Exudate Type: Serosanguineous Serous N/A Exudate Color: red, brown amber N/A Wound Margin: Flat and Intact Flat and Intact N/A Granulation Amount: Large (67-100%) None Present (0%) N/A Granulation Quality: Pink N/A N/A Necrotic Amount: None Present (0%) None Present (0%) N/A Exposed Structures: Fat Layer (  Subcutaneous Tissue) Fat Layer (Subcutaneous Tissue) N/A Exposed: Yes Exposed: Yes Fascia: No Fascia: No Tendon: No Tendon: No Muscle: No Muscle: No Joint: No Joint: No Bone: No Bone: No Epithelialization: Large (67-100%) Large (67-100%) N/A Treatment Notes Electronic Signature(s) Signed: 09/26/2019 5:04:05 PM By: Gretta Cool, BSN, RN, CWS, Kim RN, BSN Entered By: Gretta Cool, BSN, RN, CWS, Kim on 09/24/2019 10:41:52 [name] ([id number]) -------------------------------------------------------------------------------- Multi-Disciplinary Care Plan Details Patient Name: [name] Date of Service: 09/24/2019 10:15 AM Medical Record Number: [id number] Patient Account Number: [id number] Date of Birth/Sex: Aug 04, 1945 (73 y.o. M) Treating RN: Cornell Barman Primary Care Marlen Koman: [name] Other Clinician: Referring Shawana Knoch: [name] Treating Zackery Brine/Extender: Melburn Hake, HOYT Weeks in Treatment: 32 Active Inactive Pressure Nursing Diagnoses: Knowledge deficit related to management of pressures ulcers Goals: Patient will remain free from development of additional pressure ulcers Date Initiated: 04/30/2019 Target  Resolution Date: 05/14/2019 Goal Status: Active Patient/caregiver will verbalize understanding of pressure ulcer management Date Initiated: 04/30/2019 Target Resolution Date: 05/14/2019 Goal Status: Active Interventions: Assess: immobility, friction, shearing, incontinence upon admission and as needed Treatment Activities: Patient referred for pressure reduction/relief devices : 04/30/2019 Notes: Wound/Skin Impairment Nursing Diagnoses: Impaired tissue integrity Goals: Ulcer/skin breakdown will have a volume reduction of 30% by week 4 Date Initiated: 04/30/2019 Target Resolution Date: 05/31/2019 Goal Status: Active Interventions: Assess patient/caregiver ability to obtain necessary supplies Assess ulceration(s) every visit Treatment Activities: Referred to DME Belton Peplinski for dressing supplies : 04/30/2019 Skin care regimen initiated : 04/30/2019 Topical wound management initiated : 04/30/2019 Notes: Electronic Signature(s) Signed: 09/26/2019 5:04:05 PM By: Gretta Cool, BSN, RN, CWS, Kim RN, BSN Entered By: Gretta Cool, BSN, RN, CWS, Kim on 09/24/2019 10:41:35 [name] ([id number]) -------------------------------------------------------------------------------- Pain Assessment Details Patient Name: [name] Date of Service: 09/24/2019 10:15 AM Medical Record Number: [id number] Patient Account Number: [id number] Date of Birth/Sex: [birth year] (73 y.o. M) Treating RN: Montey Hora Primary Care Barbarann Kelly: [name] Other Clinician: Referring Melvina Pangelinan: [name] Treating Nayali Talerico/Extender: Melburn Hake, HOYT Weeks in Treatment: 32 Active Problems Location of Pain Severity and Description of Pain Patient Has Paino No Site Locations Pain Management and Medication Current Pain Management: Notes Patient denies pain at this time. Electronic Signature(s) Signed: 09/24/2019 12:43:42 PM By: Montey Hora Entered By: Montey Hora on 09/24/2019 10:28:44 [name]  ([id number]) -------------------------------------------------------------------------------- Patient/Caregiver Education Details Patient Name: [name] Date of Service: 09/24/2019 10:15 AM Medical Record Number: [id number] Patient Account Number: [id number] Date of Birth/Gender: 11-19-1945 (73 y.o. M) Treating RN: Cornell Barman Primary Care Physician: [name] Other Clinician: Referring Physician: [name] Treating Physician/Extender: Sharalyn Ink in Treatment: 53 Education Assessment Education Provided To: Patient Education Topics Provided Wound/Skin Impairment: Handouts: Caring for Your Ulcer Methods: Demonstration, Explain/Verbal Responses: State content correctly Electronic Signature(s) Signed: 09/26/2019 5:04:05 PM By: Gretta Cool, BSN, RN, CWS, Kim RN, BSN Entered By: Gretta Cool, BSN, RN, CWS, Kim on 09/24/2019 10:54:21 [name] ([id number]) -------------------------------------------------------------------------------- Wound Assessment Details Patient Name: [name] Date of Service: 09/24/2019 10:15 AM Medical Record Number: [id number] Patient Account Number: [id number] Date of Birth/Sex: 06-09-1945 (73 y.o. M) Treating RN: Montey Hora Primary Care Barry Faircloth: [name] Other Clinician: Referring Kareen Hitsman: [name] Treating Brittannie Tawney/Extender: [name], HOYT Weeks in Treatment: 32 Wound Status Wound Number: 5 Primary Diabetic Wound/Ulcer of the Lower Extremity Etiology: Wound Location: Left, Lateral Foot Wound Open Wounding Event: Pressure Injury Status: Date Acquired: 01/19/2019 Comorbid Cataracts, Middle ear problems, Sleep Apnea, Weeks Of Treatment: 32 History: Hypertension, Type II Diabetes, Gout,  Osteoarthritis, Clustered Wound: No Neuropathy Photos Wound Measurements Length: (cm) 0.8 % Redu Width: (cm) 1 % Redu Depth: (cm) 0.1 Epithe Area: (cm) 0.628 Tunne Volume: (cm) 0.063 Under ction in Area:  -128.4% ction in Volume: 23.2% lialization: Large (67-100%) ling: No mining: No Wound Description Classification: Grade 2 Foul Wound Margin: Flat and Intact Sloug Exudate Amount: Medium Exudate Type: Serosanguineous Exudate Color: red, brown Odor After Cleansing: No h/Fibrino No Wound Bed Granulation Amount: Large (67-100%) Exposed Structure Granulation Quality: Pink Fascia Exposed: No Necrotic Amount: None Present (0%) Fat Layer (Subcutaneous Tissue) Exposed: Yes Tendon Exposed: No Muscle Exposed: No Joint Exposed: No Bone Exposed: No Treatment Notes Wound #5 (Left, Lateral Foot) Notes prisma, BFD, on foot Coverlet on toe ABDULHADI, STOPA ([id number]) Electronic Signature(s) Signed: 09/24/2019 12:43:42 PM By: Montey Hora Entered By: Montey Hora on 09/24/2019 10:34:46 [name] ([id number]) -------------------------------------------------------------------------------- Wound Assessment Details Patient Name: [name] Date of Service: 09/24/2019 10:15 AM Medical Record Number: [id number] Patient Account Number: [id number] Date of Birth/Sex: 01-30-1946 (73 y.o. M) Treating RN: Montey Hora Primary Care Surena Welge: [name] Other Clinician: Referring Ramiro Pangilinan: [name] Treating Zac Torti/Extender: [name], HOYT Weeks in Treatment: 32 Wound Status Wound Number: 6 Primary Diabetic Wound/Ulcer of the Lower Extremity Etiology: Wound Location: Left Toe Great Wound Open Wounding Event: Trauma Status: Date Acquired: 09/09/2019 Comorbid Cataracts, Middle ear problems, Sleep Apnea, Weeks Of Treatment: 1 History: Hypertension, Type II Diabetes, Gout, Osteoarthritis, Clustered Wound: No Neuropathy Photos Wound Measurements Length: (cm) 0.1 Width: (cm) 0.1 Depth: (cm) 0.1 Area: (cm) 0.008 Volume: (cm) 0.001 % Reduction in Area: 97.1% % Reduction in Volume: 96.3% Epithelialization: Large (67-100%) Wound  Description Classification: Grade 1 Fou Wound Margin: Flat and Intact Slo Exudate Amount: Medium Exudate Type: Serous Exudate Color: amber l Odor After Cleansing: No ugh/Fibrino No Wound Bed Granulation Amount: None Present (0%) Exposed Structure Necrotic Amount: None Present (0%) Fascia Exposed: No Fat Layer (Subcutaneous Tissue) Exposed: Yes Tendon Exposed: No Muscle Exposed: No Joint Exposed: No Bone Exposed: No Treatment Notes Wound #6 (Left Toe Great) Notes prisma, BFD, on foot Coverlet on toe BREYDAN, [place] ([id number]) Electronic Signature(s) Signed: 09/24/2019 12:43:42 PM By: Montey Hora Entered By: Montey Hora on 09/24/2019 10:35:17 [name] ([id number]) -------------------------------------------------------------------------------- [place] Details Patient Name: [name] Date of Service: 09/24/2019 10:15 AM Medical Record Number: [id number] Patient Account Number: [id number] Date of Birth/Sex: 10/21/1945 (73 y.o. M) Treating RN: Montey Hora Primary Care Cambridge Deleo: [name] Other Clinician: Referring Nemesio Castrillon: [name] Treating Rifky Lapre/Extender: [name], HOYT Weeks in Treatment: 32 Vital Signs Time Taken: 10:27 Temperature (F): 98.3 Height (in): 71 Pulse (bpm): 86 Weight (lbs): 205 Respiratory Rate (breaths/min): 16 Body Mass Index (BMI): 28.6 Blood Pressure (mmHg): 148/74 Reference Range: 80 - 120 mg / dl Electronic Signature(s) Signed: 09/24/2019 12:43:42 PM By: Montey Hora Entered By: Montey Hora on 09/24/2019 81:15:72

## 2019-09-26 NOTE — Telephone Encounter (Signed)
Mr. [name] was here today for his Testosterone injection.  While here he stated that when he saw Dr. Diona Browner on 09/20/2019 they talked about him possibly having thrush.  He is sure he has it and is asking if Dr. Lorelei Pont would send him in something to treat it.  Please advise.

## 2019-09-26 NOTE — Patient Outreach (Signed)
Referral from [facility], RN to [employer] - Sent email to [email or web address] Patient report having difficulty pay for medications, p  articularly Ozempic      LM Allen Egerton

## 2019-09-26 NOTE — Progress Notes (Signed)
Per orders of Dr.Copland injection of Testosterone Cypionate given by Lauralyn Primes. Patient tolerated injection well.

## 2019-09-27 ENCOUNTER — Telehealth: Payer: Self-pay | Admitting: Family Medicine

## 2019-09-27 NOTE — Progress Notes (Signed)
  Chronic Care Management   Note  09/27/2019 Name: [name] [name]: [id number] DOB: Jun 22, 1945  [name] is a 74 y.o. year old male who is a primary care patient of Copland, Frederico Hamman, MD. I reached out to [name] by phone today in response to a referral sent by [name]'s PCP, [name], MD.   [name] was given information about Chronic Care Management services today including:  1. CCM service includes personalized support from designated clinical staff supervised by his physician, including individualized plan of care and coordination with other care providers 2. 24/7 contact phone numbers for assistance for urgent and routine care needs. 3. Service will only be billed when office clinical staff spend 20 minutes or more in a month to coordinate care. 4. Only one practitioner may furnish and bill the service in a calendar month. 5. The patient may stop CCM services at any time (effective at the end of the month) by phone call to the office staff.   Patient agreed to services and verbal consent obtained.  This note is not being shared with the patient for the following reason: To respect privacy (The patient or proxy has requested that the information not be shared).  Follow up plan:   Earney [place] [employer] Sched[facility]

## 2019-10-02 ENCOUNTER — Encounter: Payer: Self-pay | Admitting: Family Medicine

## 2019-10-03 ENCOUNTER — Other Ambulatory Visit: Payer: Self-pay

## 2019-10-03 ENCOUNTER — Other Ambulatory Visit: Payer: Self-pay | Admitting: Internal Medicine

## 2019-10-03 ENCOUNTER — Telehealth: Payer: Self-pay

## 2019-10-03 ENCOUNTER — Encounter: Payer: Medicare Other | Attending: Internal Medicine | Admitting: Nutrition

## 2019-10-03 DIAGNOSIS — E1142 Type 2 diabetes mellitus with diabetic polyneuropathy: Secondary | ICD-10-CM | POA: Diagnosis not present

## 2019-10-03 DIAGNOSIS — E1165 Type 2 diabetes mellitus with hyperglycemia: Secondary | ICD-10-CM | POA: Diagnosis not present

## 2019-10-03 DIAGNOSIS — IMO0002 Reserved for concepts with insufficient information to code with codable children: Secondary | ICD-10-CM

## 2019-10-03 NOTE — Telephone Encounter (Signed)
Can you confirm?  Is he talking about an actual ice cream cone?  Meaning a cone/sugar cone with ice cream and chocolate on it?  If that is all he is talking about, and he is still having some bleeding then it a less would be happy to see him.  I am completely full the day, but I would not pretty confident I could see him tomorrow.

## 2019-10-03 NOTE — Telephone Encounter (Signed)
Patient contacted the office and states that he was eating a Drumstick ice cream cone last night, and believes that when he swallowed part of the cone - it scratched the back of his throat behind his uvula - and states it was bleeding pretty bad, and causing him to cough up blood.  Patient states that he did go to the ER last night, but there was a 12 hour wait, and he decided to leave. Patient states the back of his throat is still sore, and will bleed every now and then also. He is wondering id Dr. Lorelei Pont can see him for this, or if there is anything he should do?

## 2019-10-03 NOTE — Telephone Encounter (Signed)
Spoke with Mr. Och.  It was a ice cream cone he was talking about.  He thinks it just scratched his throat when he swallowed it.  He is currently not have any issue but will call back tomorrow if he feels he needs to be seen.  I did tell him he could gargle with some warm salt water if it felt irritated.  Patient states understanding.

## 2019-10-04 DIAGNOSIS — M5136 Other intervertebral disc degeneration, lumbar region: Secondary | ICD-10-CM | POA: Diagnosis not present

## 2019-10-04 NOTE — Patient Instructions (Signed)
Scan at least once every 8 hours to get a full 24 hour picture of what your blood sugars are doing. Replace sensor every 14 days. Call [facility] if sensor falls off.   Test blood sugar whenever you see the blood droplet symbol.

## 2019-10-04 NOTE — Progress Notes (Signed)
We discussed the difference between blood sugar readings and sensor readings, and he reported good understanding of this.  He was shown how to load and  where to wear this sensor.  He attached the sensor to his left outer arm, and used his phone as the reader.  His account was set up, and linked to [place].  He had no final questions.

## 2019-10-08 ENCOUNTER — Encounter: Payer: Medicare Other | Admitting: Physician Assistant

## 2019-10-08 ENCOUNTER — Other Ambulatory Visit: Payer: Self-pay

## 2019-10-08 DIAGNOSIS — E11621 Type 2 diabetes mellitus with foot ulcer: Secondary | ICD-10-CM | POA: Diagnosis not present

## 2019-10-08 DIAGNOSIS — E1142 Type 2 diabetes mellitus with diabetic polyneuropathy: Secondary | ICD-10-CM | POA: Diagnosis not present

## 2019-10-08 DIAGNOSIS — L97522 Non-pressure chronic ulcer of other part of left foot with fat layer exposed: Secondary | ICD-10-CM | POA: Diagnosis not present

## 2019-10-08 DIAGNOSIS — M21372 Foot drop, left foot: Secondary | ICD-10-CM | POA: Diagnosis not present

## 2019-10-08 NOTE — Progress Notes (Addendum)
ABEDNEGO, YEATES ([id number]) Visit Report for 10/08/2019 Arrival Information Details Patient Name: Dustin, [name] Date of Service: 10/08/2019 11:00 AM Medical Record Number: [id number] Patient Account Number: [id number] Date of Birth/Sex: 10/01/1945 (73 y.o. M) Treating RN: Cornell Barman Primary Care Breyonna Nault: [name] Other Clinician: Referring Jazell Rosenau: [name] Treating Hearl Heikes/Extender: Melburn Hake, HOYT Weeks in Treatment: 32 Visit Information History Since Last Visit Added or deleted any medications: Yes Patient Arrived: Cane Any new allergies or adverse reactions: No Arrival Time: 11:09 Had a fall or experienced change in Yes Accompanied By: self activities of daily living that may affect Transfer Assistance: None risk of falls: Patient Identification Verified: Yes Signs or symptoms of abuse/neglect since last visito No Secondary Verification Process Completed: Yes Hospitalized since last visit: No Patient Has Alerts: Yes Implantable device outside of the clinic excluding No Patient Alerts: DMII cellular tissue based products placed in the center since last visit: Has Dressing in Place as Prescribed: Yes Pain Present Now: No Electronic Signature(s) Signed: 10/08/2019 12:00:35 PM By: Lorine Bears RCP, RRT, CHT Entered By: Lorine Bears on 10/08/2019 11:11:17 [name] ([id number]) -------------------------------------------------------------------------------- Encounter Discharge Information Details Patient Name: [name] Date of Service: 10/08/2019 11:00 AM Medical Record Number: [id number] Patient Account Number: [id number] Date of Birth/Sex: 07-09-1945 (73 y.o. M) Treating RN: Cornell Barman Primary Care Christy Friede: [name] Other Clinician: Referring Stephie Xu: [name] Treating Layni Kreamer/Extender: Melburn Hake, HOYT Weeks in Treatment: 40 Encounter Discharge Information Items Post Procedure  Vitals Discharge Condition: Stable Unable to obtain vitals Reason: Patient request Ambulatory Status: Ambulatory Discharge Destination: Home Transportation: Private Auto Accompanied By: self Schedule Follow-up Appointment: Yes Clinical Summary of Care: Electronic Signature(s) Signed: 10/08/2019 12:45:40 PM By: Gretta Cool, BSN, RN, CWS, Kim RN, BSN Entered By: Gretta Cool, BSN, RN, CWS, Kim on 10/08/2019 12:45:40 [name] ([id number]) -------------------------------------------------------------------------------- Lower Extremity Assessment Details Patient Name: [name] Date of Service: 10/08/2019 11:00 AM Medical Record Number: [id number] Patient Account Number: [id number] Date of Birth/Sex: [birth year] (73 y.o. M) Treating RN: Cornell Barman Primary Care Areli Frary: [name] Other Clinician: Referring Jac Romulus: [name] Treating Sharhonda Atwood/Extender: Melburn Hake, HOYT Weeks in Treatment: 34 Edema Assessment Assessed: [Left: Yes] [Right: No] Edema: [Left: N] [Right: o] Vascular Assessment Pulses: Dorsalis Pedis Palpable: [Left:Yes] Electronic Signature(s) Signed: 10/09/2019 12:55:53 PM By: Gretta Cool, BSN, RN, CWS, Kim RN, BSN Entered By: Gretta Cool, BSN, RN, CWS, Kim on 10/08/2019 11:26:46 [name] ([id number]) -------------------------------------------------------------------------------- Multi Wound Chart Details Patient Name: [name] Date of Service: 10/08/2019 11:00 AM Medical Record Number: [id number] Patient Account Number: [id number] Date of Birth/Sex: 1945/06/08 (73 y.o. M) Treating RN: Cornell Barman Primary Care Neya Creegan: [name] Other Clinician: Referring Tawni Melkonian: [name] Treating Genevra Orne/Extender: Melburn Hake, HOYT Weeks in Treatment: 34 Vital Signs Height(in): 71 Pulse(bpm): 116 Weight(lbs): 205 Blood Pressure(mmHg): 142/54 Body Mass Index(BMI): 29 Temperature(F): 98.3 Respiratory Rate(breaths/min): 18 Photos: [6:No  Photos] [N/A:N/A] Wound Location: Left, Lateral Foot Left Toe Great N/A Wounding Event: Pressure Injury Trauma N/A Primary Etiology: Diabetic Wound/Ulcer of the Lower Diabetic Wound/Ulcer of the Lower N/A Extremity Extremity Comorbid History: Cataracts, Middle ear problems, N/A N/A Sleep Apnea, Hypertension, Type II Diabetes, Gout, Osteoarthritis, Neuropathy Date Acquired: 01/19/2019 09/09/2019 N/A Weeks of Treatment: 34 3 N/A Wound Status: Open Open N/A Measurements L x W x D (cm) 0.7x0.7x0.2 0.1x0.1x0.1 N/A Area (cm) : 0.385 0.008 N/A Volume (cm) : 0.077 0.001 N/A % Reduction in Area: -40.00% 97.10% N/A % Reduction in Volume: 6.10% 96.30% N/A Classification: Grade 2  Grade 1 N/A Exudate Amount: Medium N/A N/A Exudate Type: Serosanguineous N/A N/A Exudate Color: red, brown N/A N/A Wound Margin: Flat and Intact N/A N/A Granulation Amount: Medium (34-66%) N/A N/A Granulation Quality: Pink N/A N/A Necrotic Amount: Small (1-33%) N/A N/A Exposed Structures: Fat Layer (Subcutaneous Tissue) N/A N/A Exposed: Yes Fascia: No Tendon: No Muscle: No Joint: No Bone: No Epithelialization: Large (67-100%) N/A N/A Assessment Notes: Callus around wound N/A N/A Treatment Notes Electronic Signature(s) Signed: 10/09/2019 12:55:53 PM By: Gretta Cool, BSN, RN, CWS, Kim RN, BSN Entered By: Gretta Cool, BSN, RN, CWS, Kim on 10/08/2019 11:37:34 AAYDEN, CEFALU ([name] ([id number]) -------------------------------------------------------------------------------- [place] Details Patient Name: [name] Date of Service: 10/08/2019 11:00 AM Medical Record Number: [id number] Patient Account Number: [id number] Date of Birth/Sex: May 31, 1945 (73 y.o. M) Treating RN: Cornell Barman Primary Care Kedrick Mcnamee: [name] Other Clinician: Referring Edger Husain: [name] Treating Khori Underberg/Extender: Worthy Keeler Weeks in Treatment: 62 Active Inactive Electronic  Signature(s) Signed: 10/30/2019 3:25:02 PM By: Gretta Cool, BSN, RN, CWS, Kim RN, BSN Previous Signature: 10/09/2019 12:55:53 PM Version By: Gretta Cool, BSN, RN, CWS, Kim RN, BSN Entered By: Gretta Cool, BSN, RN, CWS, Kim on 10/30/2019 15:25:02 [name] ([id number]) -------------------------------------------------------------------------------- Pain Assessment Details Patient Name: [name] Date of Service: 10/08/2019 11:00 AM Medical Record Number: [id number] Patient Account Number: [id number] Date of Birth/Sex: [birth year] (73 y.o. M) Treating RN: Cornell Barman Primary Care Nyasha Rahilly: [name] Other Clinician: Referring Douglas Smolinsky: [name] Treating Jashan Cotten/Extender: Melburn Hake, HOYT Weeks in Treatment: 34 Active Problems Location of Pain Severity and Description of Pain Patient Has Paino Yes Site Locations Pain Location: Pain in Ulcers Rate the pain. Current Pain Level: 1 Worst Pain Level: 1 Least Pain Level: 0 Tolerable Pain Level: 5 Character of Pain Describe the Pain: Dull Pain Management and Medication Current Pain Management: Electronic Signature(s) Signed: 10/08/2019 5:22:45 PM By: Worthy Keeler [name] Signed: 10/09/2019 12:55:53 PM By: Gretta Cool, BSN, RN, CWS, Kim RN, BSN Entered By: Worthy Keeler on 10/08/2019 11:17:06 [name] ([id number]) -------------------------------------------------------------------------------- Patient/Caregiver Education Details Patient Name: [name] Date of Service: 10/08/2019 11:00 AM Medical Record Number: [id number] Patient Account Number: [id number] Date of Birth/Gender: 1945/07/03 (73 y.o. M) Treating RN: Cornell Barman Primary Care Physician: [name] Other Clinician: Referring Physician: [name] Treating Physician/Extender: Sharalyn Ink in Treatment: 73 Education Assessment Education Provided To: Patient Education Topics Provided Wound/Skin Impairment: Handouts: Other: Continue  wound care as prescribed Electronic Signature(s) Signed: 10/09/2019 12:55:53 PM By: Gretta Cool, BSN, RN, CWS, Kim RN, BSN Entered By: Gretta Cool, BSN, RN, CWS, Kim on 10/08/2019 12:44:14 [name] ([id number]) -------------------------------------------------------------------------------- Wound Assessment Details Patient Name: [name] Date of Service: 10/08/2019 11:00 AM Medical Record Number: [id number] Patient Account Number: [id number] Date of Birth/Sex: March 13, 1946 (73 y.o. M) Treating RN: Cornell Barman Primary Care Calypso Hagarty: [name] Other Clinician: Referring Shamon Lobo: [name] Treating Luxe Cuadros/Extender: Melburn Hake, HOYT Weeks in Treatment: 34 Wound Status Wound Number: 5 Primary Diabetic Wound/Ulcer of the Lower Extremity Etiology: Wound Location: Left, Lateral Foot Wound Open Wounding Event: Pressure Injury Status: Date Acquired: 01/19/2019 Comorbid Cataracts, Middle ear problems, Sleep Apnea, Weeks Of Treatment: 34 History: Hypertension, Type II Diabetes, Gout, Osteoarthritis, Clustered Wound: No Neuropathy Photos Wound Measurements Length: (cm) 0.7 Width: (cm) 0.7 Depth: (cm) 0.2 Area: (cm) 0.385 Volume: (cm) 0.077 % Reduction in Area: -40% % Reduction in Volume: 6.1% Epithelialization: Large (67-100%) Tunneling: No Undermining: No Wound Description Classification: Grade 2 Wound Margin: Flat  and Intact Exudate Amount: Medium Exudate Type: Serosanguineous Exudate Color: red, brown Foul Odor After Cleansing: No Slough/Fibrino No Wound Bed Granulation Amount: Medium (34-66%) Exposed Structure Granulation Quality: Pink Fascia Exposed: No Necrotic Amount: Small (1-33%) Fat Layer (Subcutaneous Tissue) Exposed: Yes Tendon Exposed: No Muscle Exposed: No Joint Exposed: No Bone Exposed: No Assessment Notes Callus around wound Electronic Signature(s) Signed: 10/09/2019 12:55:53 PM By: Gretta Cool, BSN, RN, CWS, Kim RN, BSN Entered By: Gretta Cool,  BSN, RN, CWS, Kim on 10/08/2019 11:25:48 [name] ([id number]) -------------------------------------------------------------------------------- Wound Assessment Details Patient Name: [name] Date of Service: 10/08/2019 11:00 AM Medical Record Number: [id number] Patient Account Number: [id number] Date of Birth/Sex: 09/04/1945 (73 y.o. M) Treating RN: Cornell Barman Primary Care Markail Diekman: [name] Other Clinician: Referring Zacaria Pousson: [name] Treating Mionna Advincula/Extender: Melburn Hake, HOYT Weeks in Treatment: 34 Wound Status Wound Number: 6 Primary Etiology: Diabetic Wound/Ulcer of the Lower Extremity Wound Location: Left Toe Great Wound Status: Open Wounding Event: Trauma Date Acquired: 09/09/2019 Weeks Of Treatment: 3 Clustered Wound: No Wound Measurements Length: (cm) 0.4 Width: (cm) 0.5 Depth: (cm) 0.1 Area: (cm) 0.157 Volume: (cm) 0.016 % Reduction in Area: 42.9% % Reduction in Volume: 40.7% Wound Description Classification: Grade 1 Electronic Signature(s) Signed: 10/09/2019 12:55:53 PM By: Gretta Cool, BSN, RN, CWS, Kim RN, BSN Entered By: Gretta Cool, BSN, RN, CWS, Kim on 10/08/2019 11:38:03 [name] ([id number]) -------------------------------------------------------------------------------- Vitals Details Patient Name: [name] Date of Service: 10/08/2019 11:00 AM Medical Record Number: [id number] Patient Account Number: [id number] Date of Birth/Sex: 1945-09-29 (73 y.o. M) Treating RN: Cornell Barman Primary Care Zilphia Kozinski: [name] Other Clinician: Referring Jakyah Bradby: [name] Treating Markez Dowland/Extender: Melburn Hake, HOYT Weeks in Treatment: 34 Vital Signs Time Taken: 11:11 Temperature (F): 98.3 Height (in): 71 Pulse (bpm): 116 Weight (lbs): 205 Respiratory Rate (breaths/min): 18 Body Mass Index (BMI): 28.6 Blood Pressure (mmHg): 142/54 Reference Range: 80 - 120 mg / dl Electronic Signature(s) Signed: 10/08/2019  12:00:35 PM By: Lorine Bears RCP, RRT, CHT Entered By: Lorine Bears on 10/08/2019 11:14:12

## 2019-10-08 NOTE — Progress Notes (Addendum)
[name], [name] ([id number]) Visit Report for 10/08/2019 Chief Complaint Document Details Patient Name: [name], [name] Date of Service: 10/08/2019 11:00 AM Medical Record Number: [id number] Patient Account Number: [id number] Date of Birth/Sex: December 10, 1945 (73 y.o. M) Treating RN: Cornell Barman Primary Care Provider: [name] Other Clinician: Referring Provider: [name] Treating Provider/Extender: Melburn Hake, Tae Robak Weeks in Treatment: 34 Information Obtained from: Patient Chief Complaint Left dorsal foot ulcer 02/09/2019; patient is here for review of the wound on the left lateral foot roughly at the level of the base of the fifth metatarsal Electronic Signature(s) Signed: 10/08/2019 11:06:33 AM By: Worthy Keeler [name] Entered By: Worthy Keeler on 10/08/2019 11:06:31 [name] ([id number]) -------------------------------------------------------------------------------- Debridement Details Patient Name: [name] Date of Service: 10/08/2019 11:00 AM Medical Record Number: [id number] Patient Account Number: [id number] Date of Birth/Sex: 19-Dec-1945 (73 y.o. M) Treating RN: Cornell Barman Primary Care Provider: [name] Other Clinician: Referring Provider: [name] Treating Provider/Extender: Melburn Hake, Anyeli Hockenbury Weeks in Treatment: 34 Debridement Performed for Wound #6 Left Toe Great Assessment: Performed By: Physician [name], Tevin Shillingford E., [name] Debridement Type: Debridement Severity of Tissue Pre Debridement: Fat layer exposed Level of Consciousness (Pre- Awake and Alert procedure): Pre-procedure Verification/Time Out Yes - 11:33 Taken: Start Time: 11:33 Pain Control: Lidocaine 4% Topical Solution Total Area Debrided (L x W): 0.4 (cm) x 0.5 (cm) = 0.2 (cm) Tissue and other material Viable, Non-Viable, Callus, Slough, Subcutaneous, Slough debrided: Level: Skin/Subcutaneous Tissue Debridement Description: Excisional Instrument:  Curette Bleeding: Minimum Hemostasis Achieved: Pressure End Time: 11:35 Procedural Pain: Insensate Post Procedural Pain: Insensate Response to Treatment: Procedure was tolerated well Level of Consciousness (Post- Awake and Alert procedure): Post Debridement Measurements of Total Wound Length: (cm) 0.4 Width: (cm) 0.5 Depth: (cm) 0.2 Volume: (cm) 0.031 Character of Wound/Ulcer Post Debridement: Improved Severity of Tissue Post Debridement: Fat layer exposed Post Procedure Diagnosis Same as Pre-procedure Electronic Signature(s) Signed: 10/08/2019 11:43:44 AM By: Worthy Keeler [name] Signed: 10/09/2019 12:55:53 PM By: Gretta Cool, BSN, RN, CWS, Kim RN, BSN Entered By: Worthy Keeler on 10/08/2019 11:43:44 [name] ([id number]) -------------------------------------------------------------------------------- Debridement Details Patient Name: [name] Date of Service: 10/08/2019 11:00 AM Medical Record Number: [id number] Patient Account Number: [id number] Date of Birth/Sex: 25-Nov-1945 (73 y.o. M) Treating RN: Cornell Barman Primary Care Provider: [name] Other Clinician: Referring Provider: [name] Treating Provider/Extender: Melburn Hake, Corretta Munce Weeks in Treatment: 34 Debridement Performed for Wound #5 Left,Lateral Foot Assessment: Performed By: Physician [name], Alayshia Marini E., [name] Debridement Type: Debridement Severity of Tissue Pre Debridement: Fat layer exposed Level of Consciousness (Pre- Awake and Alert procedure): Pre-procedure Verification/Time Out Yes - 11:30 Taken: Start Time: 11:30 Pain Control: Lidocaine 4% Topical Solution Total Area Debrided (L x W): 0.7 (cm) x 0.7 (cm) = 0.49 (cm) Tissue and other material Viable, Non-Viable, Callus, Slough, Subcutaneous, Slough debrided: Level: Skin/Subcutaneous Tissue Debridement Description: Excisional Instrument: Curette Bleeding: Minimum Hemostasis Achieved: Pressure End Time: 11:32 Procedural  Pain: Insensate Post Procedural Pain: Insensate Response to Treatment: Procedure was tolerated well Level of Consciousness (Post- Awake and Alert procedure): Post Debridement Measurements of Total Wound Length: (cm) 0.7 Width: (cm) 0.7 Depth: (cm) 0.2 Volume: (cm) 0.077 Character of Wound/Ulcer Post Debridement: Improved Severity of Tissue Post Debridement: Fat layer exposed Post Procedure Diagnosis Same as Pre-procedure Electronic Signature(s) Signed: 10/08/2019 11:43:58 AM By: Worthy Keeler [name] Signed: 10/09/2019 12:55:53 PM By: Gretta Cool, BSN, RN, CWS, Kim RN, BSN Previous Signature: 10/08/2019 11:42:18 AM Version By: Melburn Hake,  Margarita Grizzle [name] Entered By: Worthy Keeler on 10/08/2019 11:43:58 [name] ([id number]) -------------------------------------------------------------------------------- HPI Details Patient Name: [name] Date of Service: 10/08/2019 11:00 AM Medical Record Number: [id number] Patient Account Number: [id number] Date of Birth/Sex: 09/09/1945 (73 y.o. M) Treating RN: Cornell Barman Primary Care Provider: [name] Other Clinician: Referring Provider: [name] Treating Provider/Extender: Melburn Hake, Daylah Sayavong Weeks in Treatment: 34 History of Present Illness HPI Description: 10/04/16 on evaluation today patient presents for initial evaluation concerning a laceration of the right wrist and hand on the bowler aspect. He tells me that he fell into glass following a medication change in regard to his diabetes. He initially went to [facility] where she was sutured and subsequently his daughter who is a Marine scientist removed the teachers at the appropriate time. Unfortunately the wound has done poorly and dehissed which has caused him some trouble including infection. He has been on antibiotics both topically and orally though the wound continues to show signs of necrotic tissue according to notes which is what he was sent to [place] for evaluation concerning. He  does have diabetes and unfortunately this is severely uncontrolled. He does see an endocrinologist and is working on this. 10/11/2016 -- the patient's notes from the ER were reviewed and he had his suturing done on 09/17/2016 and had necrotic skin flaps which had to be trimmed the last visit he was here. He has managed to get his Santyl ointment and has been using this appropriately. 10/21/16 patient's wound appears to be doing very well on evaluation today. He still has some Slough covering the wound bed but this is nowhere near as severe as when i saw this initially two weeks ago nor even my review of his pictures last week. I'm pleased with how this is progressing. Patient's wound over the right form appears to be doing fairly well on evaluation today. There is no evidence of infection and he has a small area at the crease of where she is wrist extends and flexes that is still open and appears to be having difficulty due to the fact that it is cracking open. I think this is something that may be controlled with moisture control that is keeping the wound a little bit more moist. Nonetheless this is a tiny region compared to the initial wound and overall appears to be doing very well. No fevers, chills, nausea, or vomiting noted at this time. 11/25/16 on evaluation today patient appears to be doing well in regard to his right wrist ulcer. In fact this appears to be completely healed he is having no discomfort. He is pleased with how this is progressed. Readmission: 06/24/17 on evaluation today patient appears back in our clinic regarding issues that he has been having with his right ankle on the lateral malleolus as well is in the dorsal surface of his left foot. Both have been going on for several weeks unfortunately. He tells me that initially this was being managed by Dr. Edilia Bo and I did review the notes from Dr. Edilia Bo in epic. Patient was placed on amoxicillin initially and then subsequently  Keflex following. He has been using triple antibiotic ointment over-the-counter along with a Band-Aid for the wound currently. He did not have any Santyl remaining. His most recent hemoglobin A1c which was just two days ago was 10.3. Fortunately he seems to have excellent blood flow in the bilateral lower extremities with his left ABI being 0.95 in the right ABI 1.01. Patient does have some  pain in regard to each of these ulcerations but he tells me it's not nearly as severe as the hand wound that I help treat earlier over the summer. This was in 2018. 07/15/17 on evaluation today patient's lower surety ulcer is actually both appear to be doing fairly well at this point. He does have a little bit of skin overlapping the ulcer on the right lateral ankle this was debrided away today just with saline and gauze without complication patient had no discomfort or pain. Otherwise patient's left foot ulcer does appear to be showing signs of improvement he does seem to have a right great toe. Nikki and noted at this point in time. I think that this does need to be managed at this point. No fevers, chills, nausea, or vomiting noted at this time. 07/22/17 on evaluation today patient's wounds in regard to his lower extremities appear to be doing somewhat better. His right ankle wound in fact appears to likely be completely closed. The left dorsal foot ulcer though still open does appear to be a little bit smaller and does also seem to be making good progress. He has not been having problems with the treatment at this point in the general he does not seem to show as much erythema surrounding the wound bed which is good news. He is not having significant discomfort. 08/12/17 on evaluation today patients wounds of both appeared to be doing better in fact they both appear to be completely healed. Overall I'm very pleased with how things have progressed over the past week even more than what I expected. Obviously this is  good news and he is happy about this. He did have a fall when going to his orthopedic doctor recently fortunately he does not appear to have injured anything severely but nonetheless does have a couple of bumps and bruises nothing significant at this time but he needs to be seen and wound care for. He may have broken his left wrist he is following up with orthopedics in that regard. Readmission: 10/24/17 on evaluation today patient presents for reevaluation after having been discharged Aug 12, 2017. His wound was healed at that point although he states that it has been somewhat discolored since that time. He has not experienced the drainage as far as the wound is concerned but the fact that it was still discolored been greater than two months after the fact made him want to come in for reevaluation. Upon inspection today the patient has no pain although he does have neuropathy. He does have a discolored region of the dorsal surface of the left foot which was the site where the ulcer was that we previously treated. The good news is he does not again have any discomfort although obviously I do believe this may be some scar tissue and just discoloration in that regard. Again he hasn't had any drainage or discharge since I last saw him and at this point I see no evidence of that either this area shows complete epithelialization. In general I do not feel like there's any significant issue at this point in that regard. 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 He has been in this clinic before in 2018 with a laceration on his right hand and then again in 2019 with an area over the right lateral malleolus. 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 He states that the wound is been there for the last week or KALIEF, KATTNER. ([id number]) 2.  It is been uncomfortable to walk on. The man is an active man having to look after an elderly mother. Past medical history, type 2 diabetes with polyneuropathy with a recent hemoglobin A1c in September 2011 0.3, lumbar disc disease, hypergonadism., Left foot drop and gout. We did not repeat his ABIs in the clinic quoting values from July 2019 of 0.95 and 1.01. He is not felt to have significant PAD 02/19/2019 on evaluation today patient actually appears to be doing better with regard to his foot wound based on what I am seeing currently. He was seen by Dr. Dellia Nims on readmission back into the clinic last week when I was off. The good news is the patient has been tolerating the dressing changes without any complication in fact has not really had much in the way of drainage. He does have an AFO brace which she believes is actually pushing on this area as well and has contributed to the issue. Fortunately there is no signs of active infection at this point. His ABIs appear to be well. 03/05/2019 on evaluation today patient appears to be doing well in regard to his foot ulcer. He has been tolerating the dressing changes without complication. Fortunately there is no signs of active infection. The wound is measuring somewhat smaller and overall I feel like he is making good progress at this point. 03/12/2019 on evaluation today patient actually appears to be doing quite well with regard to his foot ulcer. The wound is looking better the measurement is not significantly better at this point unfortunately but nonetheless again the overall appearance is improving I think he is making some progress. I still think the big issue here is foot drop and again the patient wants to see if potentially he could get a brace to help with this. Subsequently I think that we can definitely make a referral to [facility] to see if they can help [place] in this regard. He is in agreement with that plan. 03/26/2019 on evaluation today  patient appears to be doing more poorly in regard to his foot ulcer at this time. He did see podiatry in order to see about getting a brace but unfortunately he tells me that Medicare is not likely to pay for one for him as he had 1 I guess 8 months ago. However this one that apparently goes in his shoes that does not seem to be working out well for him. Unfortunately there are signs of active infection at this time. No fevers, chills, nausea, vomiting, or diarrhea. Systemically. The patient has erythema on the dorsal surface of his foot that tracks from the lateral portion of his foot and again he does have a blister around the wound as well which is worse compared to previous. Overall I am concerned. 04/02/2019 on evaluation today patient appears to be doing about the same with regard to his wound. Unfortunately there is no signs of significant improvement although I do think there is no signs of infection which is good news. His drainage is also slowed down tremendously. I think at this point he would benefit from the initiation of a total contact cast. The only concern I have is that over his balance. I explained to the patient that if we were to initiate the cast I think it would be greatly beneficial for him but he would need to ensure  not to walk at all unless he uses a walker he actually has 2 walkers one for inside his home and one that he keeps out in the car. For that reason he would be covered in that regard. I just do not want him to have any accidents and fall with the cast. 12/28-Patient comes in 1 week out a day early for his appointment he was in the hospital for 2 days for uncontrolled hyperglycemia and required management of high blood sugars, with some adjustments in his home regimen of insulin, patient stubbed his right great toe today and put a Band-Aid on it, he is here for his left lateral foot ulcer and we are using silver alginate dressing, he was intact total contact cast  with difficulty with balance to the extent that that had to be discontinued 04/16/2019 on evaluation today patient appears to be doing better with regard to the overall appearance of his wound size. This is good news. We had attempted a total contact cast but unfortunately he had some falling issues that the family contacted [place] about we took the cast off we did not put it back on. Nonetheless it ended up that just a couple days later he was in the hospital due to elevations in his blood sugar which went to around 500 and even a little higher. Subsequently he was admitted to the hospital and was in the hospital for a total of 3 days and that included Christmas day. Fortunately there got his blood sugar under control he tells me that this is running about 200 now which is much better news. There does not appear to be any signs of active infection at this time. No fevers, chills, nausea, vomiting, or diarrhea. 04/30/2019 on evaluation today patient actually appears to be doing slightly worse compared to previous as far as the overall size of his wound is concerned. There also is some need for sharp debridement today as well. Fortunately there is no evidence of active infection which is good news. No fevers, chills, nausea, vomiting, or diarrhea. 05/07/2019 upon evaluation today patient appears to be doing excellent in regard to his plantar foot ulcer. He has been tolerating the dressing changes without complication. Fortunately there is no signs of active infection at this time. No fevers, chills, nausea, vomiting, or diarrhea. He does tell me that he actually got in touch with [employer] and they feel like they can add diagnosis codes to his orders in order to get an external brace for him as opposed to the one that goes internal to issue. That would be excellent for his foot drop and I did tell him that if there is anything I need to fill out in that regard I will be more than happy to oblige and fill out  what is needed for [employer]. 05/14/2019 upon evaluation today patient's original wound site actually appears to be doing much better which is good news. Fortunately there is no signs of active infection at this time. No fevers, chills, nausea, vomiting, or diarrhea. With that being said the patient unfortunately is continuing to have issues with rubbing he tells me that the felt slid on his foot although he did not realize it until today. Nonetheless I think this did cause a blister around the edge of the wound and he does an open wound that is slightly larger overall in measurement compared to last time mainly due to this blister. Other than that I feel like he is doing quite well. 05/21/2019  upon evaluation today patient appears to be doing excellent in regard to his wound at this time on the plantar foot. He is showing signs of improvement he does have some slough and biofilm noted on the surface of the wound this is going require some sharp debridement today as well. Fortunately there is no evidence of active infection. No fevers, chills, nausea, vomiting, or diarrhea. 05/28/2019 on evaluation today patient's wound actually appears to be measuring smaller upon initial inspection and I do believe that it is doing well. With that being said he continues to have a blister on the outside/lateral edge of the wound that occurs as a result of rubbing due to his dropfoot currently. Subsequently we have filled out the paperwork for his new external brace but again that does take several weeks to get that completed once everything is returned to back we did send this back last week as far as the paperwork was concerned. With that being said there does not appear to be any signs of active infection at this time. 06/04/2019 on evaluation today patient's wound actually appears to be doing slightly better compared to last week which is good news. He still states that his insurance unfortunately does not seem to want  to cover any of his new brace. He is going to just pay for this himself. Nonetheless he is going by there today when he leaves here that is [employer] in [place] that is going to be making this brace for him. 06/18/2019 upon evaluation today patient fortunately does have his new brace which is the external AFO brace with bilateral uprights. Subsequently he seems to be doing much better in fact the wound already appears better. He has not even been using the felt he is just using a dressing over Valdez ([id number]) top of the wound and overall I am very pleased with how things seem to be progressing. There is no signs of active infection at this time which is great news and I think that the AFO is good to help him tremendously as far as trying to keep pressure off of the 06/25/2019 upon evaluation today patient seems to be making good progress in regard to his wound. He has been tolerating the dressing changes without complication. Fortunately there is no signs of active infection at this time. No fevers, chills, nausea, vomiting, or diarrhea. 07/02/2019 upon evaluation today patient unfortunately had a blistered area on the bottom of his foot tracking more towards the midline of the plantar aspect. He does not seem to have issues with rubbing more laterally that he used to have but nonetheless this is still giving him some trouble and causing problems here. Fortunately there is no signs of active infection at this time. No fevers, chills, nausea, vomiting, or diarrhea. 07/09/2019 upon evaluation today patient appears to be doing well with regard to his plantar foot ulcer. Definitely this is much improved compared to last week's visit which again last week the issue there was that he had an area of callus buildup on the plantar aspect of his foot I was able to clean this away and after effectively doing so the wound seems to be doing much better which is great news. There is no signs of active  infection at this time. 07/16/2019 upon evaluation today patient appears to be making some progress in my opinion with regard to his plantar foot ulcer. He has been tolerating the dressing changes without complication. Fortunately there is no evidence of active  infection at this time which is great news. 07/23/2019 upon evaluation today patient appears to be doing well with regard to his wounds on the plantar foot. He has been tolerating the dressing changes without complication. These do seem to be healing. 08/06/2019 upon evaluation today patient's wound actually appears to be showing some signs of improvement though he still has a significant amount of callus buildup over this location. That is can require some debridement today. With that being said the actual wound openings seem to be dramatically improved which is great news. There is no signs of active infection at this time. No fevers, chills, nausea, vomiting, or diarrhea. 08/13/2019 upon evaluation today patient appears to be doing well with regard to his foot ulcer. He has been tolerating the dressing changes without complication. Fortunately there is no signs of active infection at this time. No fevers, chills, nausea, vomiting, or diarrhea. 08/20/2019 upon evaluation today patient appears to be doing excellent in regard to his foot ulcer. I do believe he still showing signs of improvement this is very slow but again I think still pressure getting to the area even with his brace and custom shoe is still part of the main issue with the delay here. Nonetheless I do not see any signs of worsening or infection all of which is good news. 5/20; left lateral foot wound. He wears an AFO brace for foot drop related to his diabetes. The wound is small and clean looking he is using silver collagen. We have apparently tried him in offloading shoe he could not tolerate this. He does not have good balance. 09/11/2019 upon evaluation today patient appears to be  doing more poorly in regard to his foot ulcer as compared to last time I saw him. Unfortunately he does tell me as we discussed on Friday that he had pus and blood coming from the area underneath the callus. Unfortunately I think that this is a combination of the callus not being trimmed away enough and potentially the felt pad causing an abnormal rubbing which has happened before which led to this becoming more irritated and likely infected based on what I am seeing today. The patient takes ampicillin all the time. Subsequently he also states that he has been trying for a couple of days to take the clindamycin of which she had some leftover from previous. With that being said it upset his stomach. The antibiotic that I sent in for him on Friday unfortunately I wrote the wrong number on as far as the number of pills that he needed. With that being said I corrected that this morning but he tells me is not can pick it up as that seems to be making him have trouble with his stomach being upset at this point. Fortunately there is no signs of active infection at this time systemically. 09/17/2019 upon evaluation today patient appears to be doing better in regard to his foot ulcer. Has been taking ampicillin that he had leftover from a previous prescription. With that being said he did go see his doctor today concerning his diabetes and notes that his hemoglobin A1c was 13 obviously this is elevated and there can be working on that. Also did review the culture result that we finally got back for final well that showed rare Pseudomonas and a few group B strep noted. The Pseudomonas was sensitive to Cipro but again the wound is doing so much better with the ampicillin I think this is probably not a causative organism  I do not see anything that really has me concerned as far as infection is concerned today. Therefore I would hold off on prescribing Cipro for the patient and have him discontinue the ampicillin  until completion. 09/24/2019 upon evaluation today patient appears to be doing 09/24/2019 upon evaluation today patient appears to be doing well with regard to his foot ulcer. He actually has not enlarged any and seems to be measuring smaller quite significantly at this point. His toe ulcer is also doing somewhat better which is good news. Fortunately there is no evidence of active infection at this time. No fevers, chills, nausea, vomiting, or diarrhea. 10/08/2019 upon evaluation today patient appears to be doing well with regard to his foot ulcer all things considered. Fortunately there is no signs of active infection at this time. No fevers, chills, nausea, vomiting, or diarrhea. Fortunately he states that overall he has been doing much better with regard to not have any blistering on the side of his foot. Unfortunately he did have a fall last night due to the fact that he got up to walk without his brace on and did not have his walker either it was around 4 in the morning and his mother was calling out. Subsequently he states that the foot drop got him and he ended up following. Nonetheless he states he normally uses his walker I explained he needs to try to make sure not to ever go without the walker. No matter how attempting the situation may be. Electronic Signature(s) Signed: 10/08/2019 2:21:19 PM By: Worthy Keeler [name] Entered By: Worthy Keeler on 10/08/2019 14:21:19 [name] ([id number]) -------------------------------------------------------------------------------- Physical Exam Details Patient Name: [name] Date of Service: 10/08/2019 11:00 AM Medical Record Number: [id number] Patient Account Number: [id number] Date of Birth/Sex: 01/16/1946 (73 y.o. M) Treating RN: Cornell Barman Primary Care Provider: [name] Other Clinician: Referring Provider: [name] Treating Provider/Extender: [name], Tiffay Pinette Weeks in Treatment:  71 Constitutional Well-nourished and well-hydrated in no acute distress. Respiratory normal breathing without difficulty. Psychiatric this patient is able to make decisions and demonstrates good insight into disease process. Alert and Oriented x 3. pleasant and cooperative. Notes Upon inspection patient's wound bed actually showed signs of good granulation at this time. There does not appear to be any evidence of active infection which is great news and overall very pleased with where things stand at this time. No fevers, chills, nausea, vomiting, or diarrhea. Electronic Signature(s) Signed: 10/08/2019 2:21:34 PM By: Worthy Keeler [name] Entered By: Worthy Keeler on 10/08/2019 14:21:33 [name] ([id number]) -------------------------------------------------------------------------------- Physician Orders Details Patient Name: [name] Date of Service: 10/08/2019 11:00 AM Medical Record Number: [id number] Patient Account Number: [id number] Date of Birth/Sex: 1945/04/27 (73 y.o. 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 Wound #6 Left Toe Great o May shower with protection. Anesthetic (add to Medication List) Wound #5 Left,Lateral Foot o Topical Lidocaine 4% cream applied to wound bed prior to debridement (In Clinic Only). Wound #6 Left Toe Great o Topical Lidocaine 4% cream applied to  wound bed prior to debridement (In Clinic Only). Primary Wound  Dressing Wound #5 Left,Lateral Foot o Silver Alginate Wound #6 Left Toe Great o Silver Alginate Secondary Dressing Wound #5 Left,Lateral Foot o Boardered Foam Dressing Wound #6 Left Toe Great o Other - coverlet to toe Dressing Change Frequency Wound #5 Left,Lateral Foot o Change dressing every other day. Wound #6 Left Toe Great o Change dressing every other day. Follow-up Appointments Wound #5 Left,Lateral Foot o Return Appointment in 2 weeks. Wound #6 Left Toe Great o Return Appointment in 2 weeks. Edema Control Wound #5 Left,Lateral Foot o Elevate legs to the level of the heart and pump ankles as often as possible [name], [name]. ([id number]) Wound #6 Left Toe Great o Elevate legs to the level of the heart and pump ankles as often as possible Off-Loading Wound #5 Left,Lateral Foot o Other: - Patient should wear his foot drop brace at all times for his left foot Wound #6 Left Toe Great o Other: - Patient should wear his foot drop brace at all times for his left foot Electronic Signature(s) Signed: 10/08/2019 11:48:26 AM By: Worthy Keeler [name] Entered By: Worthy Keeler on 10/08/2019 11:48:20 [name] ([id number]) -------------------------------------------------------------------------------- Problem List Details Patient Name: [name] Date of Service: 10/08/2019 11:00 AM Medical Record Number: [id number] Patient Account Number: [id number] Date of Birth/Sex: Aug 12, 1945 (73 y.o. M) Treating RN: Cornell Barman Primary Care Provider: [name] Other Clinician: Referring Provider: [name] Treating Provider/Extender: Melburn Hake, Jesicca Dipierro Weeks in Treatment: 34 Active Problems ICD-10 Encounter Code Description Active Date MDM Diagnosis E11.621 Type 2 diabetes mellitus with foot ulcer 02/09/2019 No Yes E11.42 Type 2 diabetes mellitus with diabetic polyneuropathy 02/09/2019 No Yes L97.522 Non-pressure chronic ulcer of other  part of left foot with fat layer 02/09/2019 No Yes exposed M21.372 Foot drop, left foot 02/09/2019 No Yes Inactive Problems Resolved Problems Electronic Signature(s) Signed: 10/08/2019 11:06:25 AM By: Worthy Keeler [name] Entered By: Worthy Keeler on 10/08/2019 11:06:25 [name] ([id number]) -------------------------------------------------------------------------------- Progress Note Details Patient Name: [name] Date of Service: 10/08/2019 11:00 AM Medical Record Number: [id number] Patient Account Number: [id number] Date of Birth/Sex: 24-Jun-1945 (73 y.o. M) Treating RN: Cornell Barman Primary Care Provider: [name] Other Clinician: Referring Provider: [name] Treating Provider/Extender: Melburn Hake, Shermon Bozzi Weeks in Treatment: 34 Subjective Chief Complaint Information obtained from Patient Left dorsal foot ulcer 02/09/2019; patient is here for review of the wound on the left lateral foot roughly at the level of the base of the fifth metatarsal History of Present Illness (HPI) 10/04/16 on evaluation today patient presents for initial evaluation concerning a laceration of the right wrist and hand on the bowler aspect. He tells me that he fell into glass following a medication change in regard to his diabetes. He initially went to [facility] where she was sutured and subsequently his daughter who is a Marine scientist removed the teachers at the appropriate time. Unfortunately the wound has done poorly and dehissed which has caused him some trouble including infection. He has been on antibiotics both topically and orally though the wound continues to show signs of necrotic tissue according to notes which is what he was sent to [place] for evaluation concerning. He does have diabetes and unfortunately this is severely uncontrolled. He does see an endocrinologist and is working on this. 10/11/2016 -- the patient's notes from the ER were reviewed and he had his suturing done  on 09/17/2016 and had necrotic skin  flaps which had to be trimmed the last visit he was here. He has managed to get his Santyl ointment and has been using this appropriately. 10/21/16 patient's wound appears to be doing very well on evaluation today. He still has some Slough covering the wound bed but this is nowhere near as severe as when i saw this initially two weeks ago nor even my review of his pictures last week. I'm pleased with how this is progressing. Patient's wound over the right form appears to be doing fairly well on evaluation today. There is no evidence of infection and he has a small area at the crease of where she is wrist extends and flexes that is still open and appears to be having difficulty due to the fact that it is cracking open. I think this is something that may be controlled with moisture control that is keeping the wound a little bit more moist. Nonetheless this is a tiny region compared to the initial wound and overall appears to be doing very well. No fevers, chills, nausea, or vomiting noted at this time. 11/25/16 on evaluation today patient appears to be doing well in regard to his right wrist ulcer. In fact this appears to be completely healed he is having no discomfort. He is pleased with how this is progressed. Readmission: 06/24/17 on evaluation today patient appears back in our clinic regarding issues that he has been having with his right ankle on the lateral malleolus as well is in the dorsal surface of his left foot. Both have been going on for several weeks unfortunately. He tells me that initially this was being managed by Dr. Edilia Bo and I did review the notes from Dr. Edilia Bo in epic. Patient was placed on amoxicillin initially and then subsequently Keflex following. He has been using triple antibiotic ointment over-the-counter along with a Band-Aid for the wound currently. He did not have any Santyl remaining. His most recent hemoglobin A1c which was just  two days ago was 10.3. Fortunately he seems to have excellent blood flow in the bilateral lower extremities with his left ABI being 0.95 in the right ABI 1.01. Patient does have some pain in regard to each of these ulcerations but he tells me it's not nearly as severe as the hand wound that I help treat earlier over the summer. This was in 2018. 07/15/17 on evaluation today patient's lower surety ulcer is actually both appear to be doing fairly well at this point. He does have a little bit of skin overlapping the ulcer on the right lateral ankle this was debrided away today just with saline and gauze without complication patient had no discomfort or pain. Otherwise patient's left foot ulcer does appear to be showing signs of improvement he does seem to have a right great toe. Nikki and noted at this point in time. I think that this does need to be managed at this point. No fevers, chills, nausea, or vomiting noted at this time. 07/22/17 on evaluation today patient's wounds in regard to his lower extremities appear to be doing somewhat better. His right ankle wound in fact appears to likely be completely closed. The left dorsal foot ulcer though still open does appear to be a little bit smaller and does also seem to be making good progress. He has not been having problems with the treatment at this point in the general he does not seem to show as much erythema surrounding the wound bed which is good news. He is not having  significant discomfort. 08/12/17 on evaluation today patients wounds of both appeared to be doing better in fact they both appear to be completely healed. Overall I'm very pleased with how things have progressed over the past week even more than what I expected. Obviously this is good news and he is happy about this. He did have a fall when going to his orthopedic doctor recently fortunately he does not appear to have injured anything severely but nonetheless does have a couple of bumps  and bruises nothing significant at this time but he needs to be seen and wound care for. He may have broken his left wrist he is following up with orthopedics in that regard. Readmission: 10/24/17 on evaluation today patient presents for reevaluation after having been discharged Aug 12, 2017. His wound was healed at that point although he states that it has been somewhat discolored since that time. He has not experienced the drainage as far as the wound is concerned but the fact that it was still discolored been greater than two months after the fact made him want to come in for reevaluation. Upon inspection today the patient has no pain although he does have neuropathy. He does have a discolored region of the dorsal surface of the left foot which was the site where the ulcer was that we previously treated. The good news is he does not again have any discomfort although obviously I do believe this may be some scar tissue and just discoloration in that regard. Again he hasn't had any drainage or discharge since I last saw him and at this point I see no evidence of that either this area shows complete epithelialization. In general I do not feel like there's any significant issue at this point in that regard. [name], [name] ([id number]) READMISSION 02/09/2019 This is a 74 year old man with type 2 diabetes poorly controlled with significant peripheral neuropathy. He has been in this clinic before in 2018 with a laceration on his right hand and then again in 2019 with an area over the right lateral malleolus. 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 He states that the wound is been there for the last week or 2. It is been uncomfortable to walk on. The man is an active man having to look after an elderly mother. Past medical history, type 2 diabetes with polyneuropathy with a recent hemoglobin A1c in September 2011 0.3,  lumbar disc disease, hypergonadism., Left foot drop and gout. We did not repeat his ABIs in the clinic quoting values from July 2019 of 0.95 and 1.01. He is not felt to have significant PAD 02/19/2019 on evaluation today patient actually appears to be doing better with regard to his foot wound based on what I am seeing currently. He was seen by Dr. Dellia Nims on readmission back into the clinic last week when I was off. The good news is the patient has been tolerating the dressing changes without any complication in fact has not really had much in the way of drainage. He does have an AFO brace which she believes is actually pushing on this area as well and has contributed to the issue. Fortunately there is no signs of active infection at this point. His ABIs appear to be well. 03/05/2019 on evaluation today patient appears to be doing well in regard to his foot ulcer. He has been tolerating the dressing changes without complication. Fortunately  there is no signs of active infection. The wound is measuring somewhat smaller and overall I feel like he is making good progress at this point. 03/12/2019 on evaluation today patient actually appears to be doing quite well with regard to his foot ulcer. The wound is looking better the measurement is not significantly better at this point unfortunately but nonetheless again the overall appearance is improving I think he is making some progress. I still think the big issue here is foot drop and again the patient wants to see if potentially he could get a brace to help with this. Subsequently I think that we can definitely make a referral to [facility] to see if they can help [place] in this regard. He is in agreement with that plan. 03/26/2019 on evaluation today patient appears to be doing more poorly in regard to his foot ulcer at this time. He did see podiatry in order to see about getting a brace but unfortunately he tells me that Medicare is not likely to pay for  one for him as he had 1 I guess 8 months ago. However this one that apparently goes in his shoes that does not seem to be working out well for him. Unfortunately there are signs of active infection at this time. No fevers, chills, nausea, vomiting, or diarrhea. Systemically. The patient has erythema on the dorsal surface of his foot that tracks from the lateral portion of his foot and again he does have a blister around the wound as well which is worse compared to previous. Overall I am concerned. 04/02/2019 on evaluation today patient appears to be doing about the same with regard to his wound. Unfortunately there is no signs of significant improvement although I do think there is no signs of infection which is good news. His drainage is also slowed down tremendously. I think at this point he would benefit from the initiation of a total contact cast. The only concern I have is that over his balance. I explained to the patient that if we were to initiate the cast I think it would be greatly beneficial for him but he would need to ensure not to walk at all unless he uses a walker he actually has 2 walkers one for inside his home and one that he keeps out in the car. For that reason he would be covered in that regard. I just do not want him to have any accidents and fall with the cast. 12/28-Patient comes in 1 week out a day early for his appointment he was in the hospital for 2 days for uncontrolled hyperglycemia and required management of high blood sugars, with some adjustments in his home regimen of insulin, patient stubbed his right great toe today and put a Band-Aid on it, he is here for his left lateral foot ulcer and we are using silver alginate dressing, he was intact total contact cast with difficulty with balance to the extent that that had to be discontinued 04/16/2019 on evaluation today patient appears to be doing better with regard to the overall appearance of his wound size. This is good  news. We had attempted a total contact cast but unfortunately he had some falling issues that the family contacted [place] about we took the cast off we did not put it back on. Nonetheless it ended up that just a couple days later he was in the hospital due to elevations in his blood sugar which went to around 500 and even a little higher.  Subsequently he was admitted to the hospital and was in the hospital for a total of 3 days and that included Christmas day. Fortunately there got his blood sugar under control he tells me that this is running about 200 now which is much better news. There does not appear to be any signs of active infection at this time. No fevers, chills, nausea, vomiting, or diarrhea. 04/30/2019 on evaluation today patient actually appears to be doing slightly worse compared to previous as far as the overall size of his wound is concerned. There also is some need for sharp debridement today as well. Fortunately there is no evidence of active infection which is good news. No fevers, chills, nausea, vomiting, or diarrhea. 05/07/2019 upon evaluation today patient appears to be doing excellent in regard to his plantar foot ulcer. He has been tolerating the dressing changes without complication. Fortunately there is no signs of active infection at this time. No fevers, chills, nausea, vomiting, or diarrhea. He does tell me that he actually got in touch with [employer] and they feel like they can add diagnosis codes to his orders in order to get an external brace for him as opposed to the one that goes internal to issue. That would be excellent for his foot drop and I did tell him that if there is anything I need to fill out in that regard I will be more than happy to oblige and fill out what is needed for [employer]. 05/14/2019 upon evaluation today patient's original wound site actually appears to be doing much better which is good news. Fortunately there is no signs of active infection at this time.  No fevers, chills, nausea, vomiting, or diarrhea. With that being said the patient unfortunately is continuing to have issues with rubbing he tells me that the felt slid on his foot although he did not realize it until today. Nonetheless I think this did cause a blister around the edge of the wound and he does an open wound that is slightly larger overall in measurement compared to last time mainly due to this blister. Other than that I feel like he is doing quite well. 05/21/2019 upon evaluation today patient appears to be doing excellent in regard to his wound at this time on the plantar foot. He is showing signs of improvement he does have some slough and biofilm noted on the surface of the wound this is going require some sharp debridement today as well. Fortunately there is no evidence of active infection. No fevers, chills, nausea, vomiting, or diarrhea. 05/28/2019 on evaluation today patient's wound actually appears to be measuring smaller upon initial inspection and I do believe that it is doing well. With that being said he continues to have a blister on the outside/lateral edge of the wound that occurs as a result of rubbing due to his dropfoot currently. Subsequently we have filled out the paperwork for his new external brace but again that does take several weeks to get that completed once everything is returned to back we did send this back last week as far as the paperwork was concerned. With that being said there does not appear to be any signs of active infection at this time. [name], [name] ([id number]) 06/04/2019 on evaluation today patient's wound actually appears to be doing slightly better compared to last week which is good news. He still states that his insurance unfortunately does not seem to want to cover any of his new brace. He is going to just pay  for this himself. Nonetheless he is going by there today when he leaves here that is [employer] in [place] that is going to be  making this brace for him. 06/18/2019 upon evaluation today patient fortunately does have his new brace which is the external AFO brace with bilateral uprights. Subsequently he seems to be doing much better in fact the wound already appears better. He has not even been using the felt he is just using a dressing over top of the wound and overall I am very pleased with how things seem to be progressing. There is no signs of active infection at this time which is great news and I think that the AFO is good to help him tremendously as far as trying to keep pressure off of the 06/25/2019 upon evaluation today patient seems to be making good progress in regard to his wound. He has been tolerating the dressing changes without complication. Fortunately there is no signs of active infection at this time. No fevers, chills, nausea, vomiting, or diarrhea. 07/02/2019 upon evaluation today patient unfortunately had a blistered area on the bottom of his foot tracking more towards the midline of the plantar aspect. He does not seem to have issues with rubbing more laterally that he used to have but nonetheless this is still giving him some trouble and causing problems here. Fortunately there is no signs of active infection at this time. No fevers, chills, nausea, vomiting, or diarrhea. 07/09/2019 upon evaluation today patient appears to be doing well with regard to his plantar foot ulcer. Definitely this is much improved compared to last week's visit which again last week the issue there was that he had an area of callus buildup on the plantar aspect of his foot I was able to clean this away and after effectively doing so the wound seems to be doing much better which is great news. There is no signs of active infection at this time. 07/16/2019 upon evaluation today patient appears to be making some progress in my opinion with regard to his plantar foot ulcer. He has been tolerating the dressing changes without  complication. Fortunately there is no evidence of active infection at this time which is great news. 07/23/2019 upon evaluation today patient appears to be doing well with regard to his wounds on the plantar foot. He has been tolerating the dressing changes without complication. These do seem to be healing. 08/06/2019 upon evaluation today patient's wound actually appears to be showing some signs of improvement though he still has a significant amount of callus buildup over this location. That is can require some debridement today. With that being said the actual wound openings seem to be dramatically improved which is great news. There is no signs of active infection at this time. No fevers, chills, nausea, vomiting, or diarrhea. 08/13/2019 upon evaluation today patient appears to be doing well with regard to his foot ulcer. He has been tolerating the dressing changes without complication. Fortunately there is no signs of active infection at this time. No fevers, chills, nausea, vomiting, or diarrhea. 08/20/2019 upon evaluation today patient appears to be doing excellent in regard to his foot ulcer. I do believe he still showing signs of improvement this is very slow but again I think still pressure getting to the area even with his brace and custom shoe is still part of the main issue with the delay here. Nonetheless I do not see any signs of worsening or infection all of which is good news. 5/20; left  lateral foot wound. He wears an AFO brace for foot drop related to his diabetes. The wound is small and clean looking he is using silver collagen. We have apparently tried him in offloading shoe he could not tolerate this. He does not have good balance. 09/11/2019 upon evaluation today patient appears to be doing more poorly in regard to his foot ulcer as compared to last time I saw him. Unfortunately he does tell me as we discussed on Friday that he had pus and blood coming from the area underneath the  callus. Unfortunately I think that this is a combination of the callus not being trimmed away enough and potentially the felt pad causing an abnormal rubbing which has happened before which led to this becoming more irritated and likely infected based on what I am seeing today. The patient takes ampicillin all the time. Subsequently he also states that he has been trying for a couple of days to take the clindamycin of which she had some leftover from previous. With that being said it upset his stomach. The antibiotic that I sent in for him on Friday unfortunately I wrote the wrong number on as far as the number of pills that he needed. With that being said I corrected that this morning but he tells me is not can pick it up as that seems to be making him have trouble with his stomach being upset at this point. Fortunately there is no signs of active infection at this time systemically. 09/17/2019 upon evaluation today patient appears to be doing better in regard to his foot ulcer. Has been taking ampicillin that he had leftover from a previous prescription. With that being said he did go see his doctor today concerning his diabetes and notes that his hemoglobin A1c was 13 obviously this is elevated and there can be working on that. Also did review the culture result that we finally got back for final well that showed rare Pseudomonas and a few group B strep noted. The Pseudomonas was sensitive to Cipro but again the wound is doing so much better with the ampicillin I think this is probably not a causative organism I do not see anything that really has me concerned as far as infection is concerned today. Therefore I would hold off on prescribing Cipro for the patient and have him discontinue the ampicillin until completion. 09/24/2019 upon evaluation today patient appears to be doing 09/24/2019 upon evaluation today patient appears to be doing well with regard to his foot ulcer. He actually has not enlarged  any and seems to be measuring smaller quite significantly at this point. His toe ulcer is also doing somewhat better which is good news. Fortunately there is no evidence of active infection at this time. No fevers, chills, nausea, vomiting, or diarrhea. 10/08/2019 upon evaluation today patient appears to be doing well with regard to his foot ulcer all things considered. Fortunately there is no signs of active infection at this time. No fevers, chills, nausea, vomiting, or diarrhea. Fortunately he states that overall he has been doing much better with regard to not have any blistering on the side of his foot. Unfortunately he did have a fall last night due to the fact that he got up to walk without his brace on and did not have his walker either it was around 4 in the morning and his mother was calling out. Subsequently he states that the foot drop got him and he ended up following. Nonetheless he states he  normally uses his walker I explained he needs to try to make sure not to ever go without the walker. No matter how attempting the situation may be. Objective [name], [name] ([id number]) Constitutional Well-nourished and well-hydrated in no acute distress. Vitals Time Taken: 11:11 AM, Height: 71 in, Weight: 205 lbs, BMI: 28.6, Temperature: 98.3 F, Pulse: 116 bpm, Respiratory Rate: 18 breaths/min, Blood Pressure: 142/54 mmHg. Respiratory normal breathing without difficulty. Psychiatric this patient is able to make decisions and demonstrates good insight into disease process. Alert and Oriented x 3. pleasant and cooperative. General Notes: Upon inspection patient's wound bed actually showed signs of good granulation at this time. There does not appear to be any evidence of active infection which is great news and overall very pleased with where things stand at this time. No fevers, chills, nausea, vomiting, or diarrhea. Integumentary (Hair, Skin) Wound #5 status is Open. Original cause of  wound was Pressure Injury. The wound is located on the Left,Lateral Foot. The wound measures 0.7cm length x 0.7cm width x 0.2cm depth; 0.385cm^2 area and 0.077cm^3 volume. There is Fat Layer (Subcutaneous Tissue) Exposed exposed. There is no tunneling or undermining noted. There is a medium amount of serosanguineous drainage noted. The wound margin is flat and intact. There is medium (34-66%) pink granulation within the wound bed. There is a small (1-33%) amount of necrotic tissue within the wound bed. General Notes: Callus around wound Wound #6 status is Open. Original cause of wound was Trauma. The wound is located on the Left Toe Great. The wound measures 0.4cm length x 0.5cm width x 0.1cm depth; 0.157cm^2 area and 0.016cm^3 volume. 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 There was a Excisional Skin/Subcutaneous Tissue Debridement with a total area of 0.49 sq cm performed by [name], Shikara Mcauliffe E., [name]. With the following instrument(s): Curette to remove Viable and Non-Viable tissue/material. Material removed includes Callus, Subcutaneous Tissue, and Slough after achieving pain control using Lidocaine 4% Topical Solution. No specimens were taken. A time out was conducted at 11:30, prior to the start of the procedure. A Minimum amount of bleeding was controlled with Pressure. The procedure was tolerated well with a pain level of Insensate throughout and a pain level of Insensate following the procedure. Post Debridement Measurements: 0.7cm length x 0.7cm width x 0.2cm depth; 0.077cm^3 volume. Character of Wound/Ulcer  Post Debridement is improved. Severity of Tissue Post Debridement is: Fat layer exposed. Post procedure Diagnosis Wound #5: Same as Pre-Procedure Wound #6 Pre-procedure diagnosis of Wound #6 is a Diabetic Wound/Ulcer of the Lower Extremity located on the Left Toe Great .Severity of Tissue Pre Debridement is: Fat layer exposed. There was a Excisional Skin/Subcutaneous Tissue Debridement with a total area of 0.2 sq cm performed by [name], [place] Chelf E., [name]. With the following instrument(s): Curette to remove Viable and Non-Viable tissue/material. Material removed includes Callus, Subcutaneous Tissue, and Slough after achieving pain control using Lidocaine 4% Topical Solution. No specimens were taken. A time out was conducted at 11:33, prior to the start of the procedure. A Minimum amount of bleeding was controlled with Pressure. The procedure was tolerated well with a pain level of Insensate throughout  and a pain level of Insensate following the procedure. Post Debridement Measurements: 0.4cm length x 0.5cm width x 0.2cm depth; 0.031cm^3 volume. Character of Wound/Ulcer Post Debridement is improved. Severity of Tissue Post Debridement is: Fat layer exposed. Post procedure Diagnosis Wound #6: Same as Pre-Procedure [name], [name] ([id number]) Plan Wound Cleansing: Wound #5 Left,Lateral Foot: May shower with protection. Wound #6 Left Toe Great: May shower with protection. Anesthetic (add to Medication List): Wound #5 Left,Lateral Foot: Topical Lidocaine 4% cream applied to wound bed prior to debridement (In Clinic Only). Wound #6 Left Toe Great: Topical Lidocaine 4% cream applied to wound bed prior to debridement (In Clinic Only). Primary Wound Dressing: Wound #5 Left,Lateral Foot: Silver Alginate Wound #6 Left Toe Great: Silver Alginate Secondary Dressing: Wound #5 Left,Lateral Foot: Boardered Foam Dressing Wound #6 Left Toe Great: Other - coverlet to toe Dressing Change  Frequency: Wound #5 Left,Lateral Foot: Change dressing every other day. Wound #6 Left Toe Great: Change dressing every other day. Follow-up Appointments: Wound #5 Left,Lateral Foot: Return Appointment in 2 weeks. Wound #6 Left Toe Great: Return Appointment in 2 weeks. Edema Control: Wound #5 Left,Lateral Foot: Elevate legs to the level of the heart and pump ankles as often as possible Wound #6 Left Toe Great: Elevate legs to the level of the heart and pump ankles as often as possible Off-Loading: Wound #5 Left,Lateral Foot: Other: - Patient should wear his foot drop brace at all times for his left foot Wound #6 Left Toe Great: Other: - Patient should wear his foot drop brace at all times for his left foot 1. I did perform a fairly complete debridement to remove callus from around the toe as well as the foot and the patient tolerated this today without complication. Post debridement the wound bed appears to be doing much better which is great news and overall very pleased with where things stand today at both locations. 2. I also did go ahead and switch back to the silver alginate dressing I think that is probably a better option for him he felt much better about when he was utilizing that as well. 3. I am also going to suggest that patient continue to be monitored for any signs of worsening as far as infection is concerned though to be honest I feel like he is doing quite well in that regard. We will see patient back for reevaluation in 2 weeks here in the clinic. If anything worsens or changes patient will contact our office for additional recommendations. Electronic Signature(s) Signed: 10/08/2019 2:22:34 PM By: Worthy Keeler [name] Entered By: Worthy Keeler on 10/08/2019 14:22:34 [name] ([id number]) -------------------------------------------------------------------------------- SuperBill Details Patient Name: [name] Date of Service: 10/08/2019 Medical  Record Number: [id number] Patient Account Number: [id number] Date of Birth/Sex: Dec 04, 1945 (73 y.o. M) Treating RN: Cornell Barman Primary Care Provider: [name] Other Clinician: Referring Provider: [name] Treating Provider/Extender: Melburn Hake, Ephrem Carrick Weeks in Treatment: 34 Diagnosis Coding ICD-10 Codes Code Description E11.621 Type 2 diabetes mellitus with foot ulcer E11.42 Type 2 diabetes mellitus with diabetic polyneuropathy L97.522 Non-pressure chronic ulcer of other part of left foot with fat layer exposed M21.372 Foot drop, left foot Facility Procedures CPT4 Code: 02637858 Description: 85027 - DEB SUBQ TISSUE 20 SQ CM/< Modifier: Quantity: 1 CPT4 Code: Description: ICD-10 Diagnosis Description L97.522 Non-pressure chronic ulcer of other part of left foot with fat layer exp Modifier: osed Quantity: Physician Procedures CPT4 Code: 7412878 Description: 11042 - WC PHYS SUBQ TISS 20  SQ CM Modifier: Quantity: 1 CPT4 Code: Description: ICD-10 Diagnosis Description L97.522 Non-pressure chronic ulcer of other part of left foot with fat layer exp Modifier: osed Quantity: Electronic Signature(s) Signed: 10/08/2019 2:22:46 PM By: Worthy Keeler [name] Entered By: Worthy Keeler on 10/08/2019 14:22:44

## 2019-10-09 ENCOUNTER — Other Ambulatory Visit: Payer: Self-pay | Admitting: *Deleted

## 2019-10-09 NOTE — Progress Notes (Signed)
[name], [name] ([id number]) Visit Report for 10/08/2019 Fall Risk Assessment Details Patient Name: [name], [name] Date of Service: 10/08/2019 11:00 AM Medical Record Number: [id number] Patient Account Number: [id number] Date of Birth/Sex: 12-02-1945 (73 y.o. M) Treating RN: Cornell Barman Primary Care Nery Kalisz: [name] Other Clinician: Referring Sydnee Lamour: [name] Treating Bane Hagy/Extender: Melburn Hake, HOYT Weeks in Treatment: 34 Fall Risk Assessment Items Have you had 2 or more falls in the last 12 monthso 0 Yes Have you had any fall that resulted in injury in the last 12 monthso 0 Yes FALLS RISK SCREEN History of falling - immediate or within 3 months 0 No Secondary diagnosis (Do you have 2 or more medical diagnoseso) 0 No Ambulatory aid None/bed rest/wheelchair/nurse 0 No Crutches/cane/walker 15 Yes Furniture 0 No Intravenous therapy Access/Saline/Heparin Lock 0 No Gait/Transferring Normal/ bed rest/ wheelchair 0 No Weak (short steps with or without shuffle, stooped but able to lift head while walking, may 10 Yes seek support from furniture) Impaired (short steps with shuffle, may have difficulty arising from chair, head down, impaired 0 No balance) Mental Status Oriented to own ability 0 No Electronic Signature(s) Signed: 10/09/2019 12:55:53 PM By: Gretta Cool, BSN, RN, CWS, Kim RN, BSN Entered By: Gretta Cool, BSN, RN, CWS, Kim on 10/08/2019 11:21:47

## 2019-10-09 NOTE — Patient Outreach (Signed)
[facility]) Care Management  10/09/2019  [name] [birth year] [id number]   Call placed to member to follow up on diabetes management.  He report he has been monitoring blood sugars vis his new Freestyle.  Was 140 last night before bed and 260 this morning.  He had appointment with nutritionist on 6/23 to set meter up, will call to schedule follow up.  Last seen endocrinologist on 6/7, next visit in October.  State his wound is healing, was seen by wound center yesterday, report he is changing his dressing every other day.  Report he had a fall when going into his mother's room in the dark and now having pain in his side.  Has appointment with PCP tomorrow for evaluation. He denies having contact with pharmacy team regarding medication management, advised to wait for call, noted one is scheduled for 7/1.  Advised to call pharmacist back if he is unable to answer at that time.  Denies any urgent concerns, encouraged to contact this care manager with questions.  Will follow up within the next month.  [facility] CM Care Plan Problem One     Most Recent Value  Care Plan Problem One Knowledge deficit regarding diabetes management as evidenced by elevated A1C  Role Documenting the Problem One Care Management [place] for Problem One Active  [facility] Long Term Goal  Member's A1C will be decreased to </= 10 within the next 3 months  [facility] Long Term Goal Start Date 09/26/19  Interventions for Problem One Long Term Goal Confirmed member has received EMMI education.  Reviewed management of diabetes and complications of uncontrolled diabetes  [facility] CM Short Term Goal #1  Member will report plan to afford medications within the next 2 weeks  [facility] CM Short Term Goal #1 Start Date 09/26/19  Interventions for Short Term Goal #1 Advised member of importance of follow up with pharmacist when calls are missed  [facility] CM Short Term Goal #2  Member will report foot wound healing within the next 4  weeks  [facility] CM Short Term Goal #2 Start Date 09/26/19  Interventions for Short Term Goal #2 Confirmed upcoming appointments with wound center, discussed frequency of dressing changes     [name], RN, MSN [place] [phone]

## 2019-10-10 ENCOUNTER — Other Ambulatory Visit: Payer: Self-pay | Admitting: Family Medicine

## 2019-10-10 ENCOUNTER — Ambulatory Visit (INDEPENDENT_AMBULATORY_CARE_PROVIDER_SITE_OTHER): Payer: Medicare Other | Admitting: Family Medicine

## 2019-10-10 ENCOUNTER — Ambulatory Visit (INDEPENDENT_AMBULATORY_CARE_PROVIDER_SITE_OTHER)
Admission: RE | Admit: 2019-10-10 | Discharge: 2019-10-10 | Disposition: A | Discharge status: Home / Self Care | Payer: Medicare Other | Source: Ambulatory Visit | Attending: Family Medicine | Admitting: Family Medicine

## 2019-10-10 ENCOUNTER — Ambulatory Visit: Payer: Medicare Other

## 2019-10-10 ENCOUNTER — Encounter: Payer: Self-pay | Admitting: Family Medicine

## 2019-10-10 ENCOUNTER — Other Ambulatory Visit: Payer: Self-pay

## 2019-10-10 VITALS — BP 130/62 | HR 128 | Temp 98.8°F | Ht 71.5 in | Wt 199.0 lb

## 2019-10-10 DIAGNOSIS — N1831 Chronic kidney disease, stage 3a: Secondary | ICD-10-CM | POA: Diagnosis not present

## 2019-10-10 DIAGNOSIS — Z87891 Personal history of nicotine dependence: Secondary | ICD-10-CM

## 2019-10-10 DIAGNOSIS — C3490 Malignant neoplasm of unspecified part of unspecified bronchus or lung: Secondary | ICD-10-CM

## 2019-10-10 DIAGNOSIS — E1165 Type 2 diabetes mellitus with hyperglycemia: Secondary | ICD-10-CM | POA: Diagnosis not present

## 2019-10-10 DIAGNOSIS — E1142 Type 2 diabetes mellitus with diabetic polyneuropathy: Secondary | ICD-10-CM

## 2019-10-10 DIAGNOSIS — W19XXXA Unspecified fall, initial encounter: Secondary | ICD-10-CM

## 2019-10-10 DIAGNOSIS — R590 Localized enlarged lymph nodes: Secondary | ICD-10-CM | POA: Diagnosis not present

## 2019-10-10 DIAGNOSIS — S299XXA Unspecified injury of thorax, initial encounter: Secondary | ICD-10-CM | POA: Diagnosis not present

## 2019-10-10 DIAGNOSIS — R0781 Pleurodynia: Secondary | ICD-10-CM | POA: Diagnosis not present

## 2019-10-10 DIAGNOSIS — R918 Other nonspecific abnormal finding of lung field: Secondary | ICD-10-CM

## 2019-10-10 DIAGNOSIS — IMO0002 Reserved for concepts with insufficient information to code with codable children: Secondary | ICD-10-CM

## 2019-10-10 DIAGNOSIS — C3491 Malignant neoplasm of unspecified part of right bronchus or lung: Secondary | ICD-10-CM

## 2019-10-10 LAB — HEPATIC FUNCTION PANEL
ALT: 11 U/L (ref 0–53)
AST: 12 U/L (ref 0–37)
Albumin: 3.8 g/dL (ref 3.5–5.2)
Alkaline Phosphatase: 89 U/L (ref 39–117)
Bilirubin, Direct: 0.2 mg/dL (ref 0.0–0.3)
Total Bilirubin: 0.7 mg/dL (ref 0.2–1.2)
Total Protein: 7.6 g/dL (ref 6.0–8.3)

## 2019-10-10 LAB — CBC WITH DIFFERENTIAL/PLATELET
Basophils Absolute: 0 10*3/uL (ref 0.0–0.1)
Basophils Relative: 0.3 % (ref 0.0–3.0)
Eosinophils Absolute: 0.1 10*3/uL (ref 0.0–0.7)
Eosinophils Relative: 0.6 % (ref 0.0–5.0)
HCT: 37.5 % — ABNORMAL LOW (ref 39.0–52.0)
Hemoglobin: 12.4 g/dL — ABNORMAL LOW (ref 13.0–17.0)
Lymphocytes Relative: 9.4 % — ABNORMAL LOW (ref 12.0–46.0)
Lymphs Abs: 1 10*3/uL (ref 0.7–4.0)
MCHC: 33 g/dL (ref 30.0–36.0)
MCV: 84.5 fl (ref 78.0–100.0)
Monocytes Absolute: 1.2 10*3/uL — ABNORMAL HIGH (ref 0.1–1.0)
Monocytes Relative: 11.2 % (ref 3.0–12.0)
Neutro Abs: 8.7 10*3/uL — ABNORMAL HIGH (ref 1.4–7.7)
Neutrophils Relative %: 78.5 % — ABNORMAL HIGH (ref 43.0–77.0)
Platelets: 230 10*3/uL (ref 150.0–400.0)
RBC: 4.44 Mil/uL (ref 4.22–5.81)
RDW: 14.4 % (ref 11.5–15.5)
WBC: 11.1 10*3/uL — ABNORMAL HIGH (ref 4.0–10.5)

## 2019-10-10 LAB — BASIC METABOLIC PANEL
BUN: 16 mg/dL (ref 6–23)
CO2: 27 mEq/L (ref 19–32)
Calcium: 9.5 mg/dL (ref 8.4–10.5)
Chloride: 93 mEq/L — ABNORMAL LOW (ref 96–112)
Creatinine, Ser: 1.52 mg/dL — ABNORMAL HIGH (ref 0.40–1.50)
GFR: 45.06 mL/min — ABNORMAL LOW (ref >60.00)
Glucose, Bld: 354 mg/dL — ABNORMAL HIGH (ref 70–99)
Potassium: 4.8 mEq/L (ref 3.5–5.1)
Sodium: 131 mEq/L — ABNORMAL LOW (ref 135–145)

## 2019-10-10 LAB — TSH: TSH: 1.42 u[IU]/mL (ref 0.35–4.50)

## 2019-10-10 LAB — HEMOGLOBIN A1C: Hgb A1c MFr Bld: 10.6 % — ABNORMAL HIGH (ref 4.6–6.5)

## 2019-10-10 NOTE — Patient Instructions (Signed)
KEEP YOUR PHONE ON YOU - WE ARE GETTING THE LUNG CT SET UP ASAP  GO TO THE LAB NOW

## 2019-10-10 NOTE — Progress Notes (Addendum)
   T. , MD, CAQ Sports Medicine  Primary Care and Sports Medicine [facility] [address] [place], [address]  Phone: [phone]  FAX: [phone]  [name] - 73 y.o. male  [name] [id number]  Date of Birth: 03/01/1946  Date: 10/10/2019  PCP: , , MD  Referral: , , MD  Chief Complaint  Patient presents with  . Fall    Monday  . Pain in Ribs    Left Side    This visit occurred during the SARS-CoV-2 public health emergency.  Safety protocols were in place, including screening questions prior to the visit, additional usage of staff PPE, and extensive cleaning of exam room while observing appropriate contact time as indicated for disinfecting solutions.   Subjective:   [name] is a 73 y.o. very pleasant male patient with Body mass index is 27.37 kg/m. who presents with the following:  He presents with new onset left-sided rib pain.  He did have a fall 2 days ago.  Date of injury October 08, 2019.  Tripped over his dog and hit under his arm.  He struck his dresser in the home underneath his axilla.  Since then he has had quite a bit of tenderness.  He is having tenderness with moving his arm on the left side in the axilla region.  He also has some pain in the sternum.  Not having any pain whatsoever on the right.  He has not been sick at all.  Not having any consistent coughing, but he does have a significant history of smoking in the past.  He is not an active smoker.  He is a retired truck driver, and he has an approximate 100-pack-year history though he did quit in 2002.  Review of Systems is noted in the HPI, as appropriate  Objective:   BP 130/62   Pulse (!) 128   Temp 98.8 F (37.1 C) (Temporal)   Ht 5' 11.5" (1.816 m)   Wt 199 lb (90.3 kg)   SpO2 98%   BMI 27.37 kg/m   GEN: No acute distress; alert,appropriate. PULM: Breathing comfortably in no respiratory distress,  CTA B PSYCH: Normally interactive.   The right side of his ribs are entirely nontender.  He does have pain with compression of the sternum, and he does not have any pain in the lower left-sided ribs with compression.  In and about the axilla he has tenderness to palpation even with modest compression.  Laboratory and Imaging Data: DG Ribs Unilateral W/Chest Left  Result Date: 10/10/2019 CLINICAL DATA:  Pain following fall EXAM: LEFT RIBS AND CHEST - 3+ VIEW COMPARISON:  August 01, 2018 FINDINGS: Frontal chest as well as oblique and cone-down rib images were obtained. There is a mass in the right perihilar region measuring 5.7 x 4.5 cm. There is suspected adenopathy in the right hilum and azygos regions. Lungs elsewhere are clear. Heart size and pulmonary vascularity are normal. No adenopathy. There is no evident pneumothorax or pleural effusion. No evident rib fracture. There is apparent soft tissue prominence in the left anterior hemithorax. IMPRESSION: 1.  No evident rib fracture. 2. Apparent mass in or overlying the right perihilar region measuring 5.7 x 4.5 cm with probable adenopathy in the right hilum and azygos regions. Advise correlation with contrast enhanced chest CT to further evaluate. 3. Soft tissue prominence anterior left hemithorax of uncertain etiology. Particular attention this area on CT advised. These results will be called   to the ordering clinician or representative by the Radiologist Assistant, and communication documented in the PACS or [employer]. Electronically Signed   By: Lowella Grip III M.D.   On: 10/10/2019 11:44   CT Chest W Contrast  Result Date: 10/11/2019 CLINICAL DATA:  Abnormal chest x-ray with RIGHT hilar mass EXAM: CT CHEST WITH CONTRAST TECHNIQUE: Multidetector CT imaging of the chest was performed during intravenous contrast administration. CONTRAST:  58m OMNIPAQUE IOHEXOL 300 MG/ML  SOLN COMPARISON:  Chest radiograph 10/10/2018 FINDINGS: Cardiovascular:  No significant vascular findings. Normal heart size. No pericardial effusion. Mediastinum/Nodes: No axillary supraclavicular adenopathy. The small benign appearing cyst in the LEFT lobe of thyroid gland measuring 10 mm. Bulky RIGHT paratracheal adenopathy. RIGHT lower paratracheal lymph node measures 32 mm short axis. Smaller high RIGHT paratracheal lymph nodes measuring 6 mm on image 23/2. Large subcarinal lymph node measuring 21 mm. Large hilar /perihilar mass posterior to the RIGHT hilum along the bronchus intermedius and RIGHT lower lobe bronchus measures 5.5 by 5.6 cm and demonstrates uniform enhancement. Smaller pleural base nodule measuring 2.5 cm posterior to the mass on image 53/2. Additional peripheral nodule in the RIGHT lower lobe along the pleural surface measures 13 mm on image 76/3. Lungs/Pleura: RIGHT lower lobe/RIGHT hilar mass described in the mediastinal section. Mass does span the oblique fissure into the RIGHT upper lobe seen on sagittal imaging Upper Abdomen: Limited view of the liver, kidneys, pancreas are unremarkable. Normal adrenal glands. Musculoskeletal: No aggressive osseous lesion IMPRESSION: 1. Large RIGHT lower lobe/RIGHT hilar mass most consistent with primary bronchogenic carcinoma (consider small cell lung cancer). Mass does across the RIGHT oblique fissure into the RIGHT upper lobe. Mass also narrows the RIGHT lower lobe bronchus. 2. Bulky RIGHT lower paratracheal and subcarinal adenopathy. Smaller metastatic nodes in the high RIGHT paratracheal nodal station. 3. No evidence of supraclavicular nodal metastasis. 4. Two peripheral nodules in the RIGHT lower lobe likely metastatic deposits or primary lesion. 5. If multidisciplinary follow up management is desired, this is available in the [facility] [phone] These results will be called to the ordering clinician or representative by the Radiologist Assistant, and  communication documented in the PACS or [employer] Electronically Signed   By: SSuzy BouchardM.D.   On: 10/11/2019 15:40    CLINICAL DATA:  74year old male with history of left-sided chest pain today occurring 3-4 times. Former smoker.  EXAM: PORTABLE CHEST 1 VIEW  COMPARISON:  Chest x-ray 09/20/2016.  FINDINGS: Lung volumes are normal. No consolidative airspace disease. No pleural effusions. No pneumothorax. No pulmonary nodule or mass noted. Pulmonary vasculature and the cardiomediastinal silhouette are within normal limits.  IMPRESSION: No radiographic evidence of acute cardiopulmonary disease.   Electronically Signed   By: DVinnie LangtonM.D.   On: 08/01/2018 18:11  Lab Review:  CBC EXTENDED Latest Ref Rng & Units 10/10/2019 04/08/2019 04/07/2019  WBC 4.0 - 10.5 K/uL 11.1(H) 7.5 7.4  RBC 4.22 - 5.81 Mil/uL 4.44 3.72(L) 4.43  HGB 13.0 - 17.0 g/dL 12.4(L) 10.7(L) 12.9(L)  HCT 39 - 52 % 37.5(L) 31.3(L) 36.4(L)  PLT 150 - 400 K/uL 230.0 164 206  NEUTROABS 1.4 - 7.7 K/uL 8.7(H) - -  LYMPHSABS 0.7 - 4.0 K/uL 1.0 - -    BMP Latest Ref Rng & Units 10/10/2019 04/18/2019 04/08/2019  Glucose 70 - 99 mg/dL 354(H) 355(H) 335(H)  BUN 6 - 23 mg/dL 16 23 30(H)  Creatinine 0.40 - 1.50 mg/dL 1.52(H) 1.50 2.07(H)  BUN/Creat Ratio 10 - 22 - - -  Sodium 135 - 145 mEq/L 131(L) 130(L) 130(L)  Potassium 3.5 - 5.1 mEq/L 4.8 4.5 3.7  Chloride 96 - 112 mEq/L 93(L) 92(L) 98  CO2 19 - 32 mEq/L _0 Calcium 8.4 - 10.5 mg/dL 9.5 9.7 8.3(L)    Hepatic Function Latest Ref Rng & Units 10/10/2019 04/06/2019 01/03/2019  Total Protein 6.0 - 8.3 g/dL 7.6 7.3 6.8  Albumin 3.5 - 5.2 g/dL 3.8 3.6 4.1  AST 0 - 37 U/L 12 13(L) 8  ALT 0 - 53 U/L _1 Alk Phosphatase 39 - 117 U/L 89 91 93  Total Bilirubin 0.2 - 1.2 mg/dL 0.7 1.4(H) 0.6  Bilirubin, Direct 0.0 - 0.3 mg/dL 0.2 - 0.1    Lab Results  Component Value Date   CHOL 172 01/03/2019   Lab Results  Component Value Date    HDL 31.40 (L) 01/03/2019   Lab Results  Component Value Date   LDLCALC 43 04/24/2009   Lab Results  Component Value Date   TRIG (H) 01/03/2019    521.0 Triglyceride is over 400; calculations on Lipids are invalid.   Lab Results  Component Value Date   CHOLHDL 5 01/03/2019   No results for input(s): PSA in the last 72 hours. Lab Results  Component Value Date   HCVAB NEGATIVE 08/26/2015   Lab Results  Component Value Date   VD25OH 15.29 (L) 12/29/2017     Lab Results  Component Value Date   HGBA1C 10.6 (H) 10/10/2019   HGBA1C 13.0 (A) 09/17/2019   HGBA1C 12.0 (H) 04/07/2019   Lab Results  Component Value Date   MICROALBUR 57.6 (H) 01/03/2019   LDLCALC 43 04/24/2009   CREATININE 1.52 (H) 10/10/2019     Assessment and Plan:     ICD-10-CM   1. 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 Rib pain on left side  H84.69 Basic metabolic panel    CBC with Differential/Platelet    Hepatic function panel    Hemoglobin A1c    TSH    CT Chest W Contrast    CANCELED: DG Ribs Unilateral Left    CANCELED: CT Chest W Contrast  3. Fall, initial encounter  W19.XXXA Basic metabolic panel    CBC with Differential/Platelet    Hepatic function panel    Hemoglobin A1c    TSH    CT Chest W Contrast    CANCELED: DG Ribs Unilateral Left    CANCELED: CT Chest W Contrast  4. Mass of right lung  G29.5 Basic metabolic panel    CBC with Differential/Platelet    Hepatic function panel    Hemoglobin A1c    TSH    CT Chest W Contrast    Ambulatory referral to Thoracic Oncology    NM PET Image Initial (PI) Skull Base To Thigh    CANCELED: CT Chest W Contrast  5. Malignant neoplasm of unspecified part of unspecified bronchus or lung (HCC)  M84.13 Basic metabolic panel    CBC with Differential/Platelet    Hepatic function panel    Hemoglobin A1c    TSH     CT Chest W Contrast    NM PET Image Initial (PI) Skull Base To Thigh    CANCELED: CT Chest W Contrast    CANCELED: CT Chest W  Contrast  6. Stage 3a chronic kidney disease  N18.31 Basic metabolic panel    CBC with Differential/Platelet    Hepatic function panel    Hemoglobin A1c    TSH    CT Chest W Contrast    CANCELED: CT Chest W Contrast  7. Former very heavy cigarette smoker (more than 40 per day)  Z87.891 Basic metabolic panel    CBC with Differential/Platelet    Hepatic function panel    Hemoglobin A1c    TSH    CT Chest W Contrast    CANCELED: CT Chest W Contrast  8. Uncontrolled type 2 diabetes mellitus with peripheral neuropathy (HCC)  E11.42 Basic metabolic panel   E11.65 CBC with Differential/Platelet    Hepatic function panel    Hemoglobin A1c    TSH    CT Chest W Contrast    CANCELED: CT Chest W Contrast   Total encounter time: 40 minutes. On the day of the patient encounter, this can include review of prior records, labs, and imaging.  Additional time can include counselling, consultation with peer MD in person or by telephone.  This also includes independent review of Radiology.  I spoke about this case with 2 of my partners in the office as well as Dr. Woodruff from radiology.  He does have rib pain, no fracture.  On his chest x-ray, the patient has a large mass in his right lungs 5 x 6 cm.  This is not visualized on his prior chest x-ray from April, 2020.  He does have a 100-pack-year history of smoking.  Obtain a CT of the chest with contrast.  Evaluate for lung primary neoplasm.  We are trying to obtain this either today or tomorrow.  He does have chronic kidney disease stage III, so I am getting a stat BMP as well as stat other labs for results today.  Following chest CT, presuming that this is positive, obtain a consult with the multidisciplinary thoracic oncology clinic.  I am thankful for their help.  Unfortunately, he also has very poorly controlled  diabetes, and he has had poorly controlled diabetes for years.  Addendum: 10/11/2019, 3 PM At this time, the patient's CT of his chest with contrast has returned.  I have spoken to Sigurd on the telephone, and he understands that he has a very large lung cancer on the right side.  I am going to have him see the multidisciplinary thoracic oncology clinic for their help, and I am thankful for their assistance.  CT Chest W Contrast  Result Date: 10/11/2019 CLINICAL DATA:  Abnormal chest x-ray with RIGHT hilar mass EXAM: CT CHEST WITH CONTRAST TECHNIQUE: Multidetector CT imaging of the chest was performed during intravenous contrast administration. CONTRAST:  80mL OMNIPAQUE IOHEXOL 300 MG/ML  SOLN COMPARISON:  Chest radiograph 10/10/2018 FINDINGS: Cardiovascular: No significant vascular findings. Normal heart size. No pericardial effusion. Mediastinum/Nodes: No axillary supraclavicular adenopathy. The small benign appearing cyst in the LEFT lobe of thyroid gland measuring 10 mm. Bulky RIGHT paratracheal adenopathy. RIGHT lower paratracheal lymph node measures 32 mm short axis. Smaller high RIGHT paratracheal lymph nodes measuring 6 mm on image 23/2. Large subcarinal lymph node measuring 21 mm. Large hilar /perihilar mass posterior to the RIGHT hilum along the bronchus intermedius and RIGHT lower lobe bronchus measures 5.5 by 5.6 cm and demonstrates uniform enhancement. Smaller pleural base nodule measuring 2.5 cm posterior to the mass on image 53/2. Additional peripheral nodule in the RIGHT lower lobe along the pleural surface   measures 13 mm on image 76/3. Lungs/Pleura: RIGHT lower lobe/RIGHT hilar mass described in the mediastinal section. Mass does span the oblique fissure into the RIGHT upper lobe seen on sagittal imaging Upper Abdomen: Limited view of the liver, kidneys, pancreas are unremarkable. Normal adrenal glands. Musculoskeletal: No aggressive osseous lesion IMPRESSION: 1. Large RIGHT lower lobe/RIGHT  hilar mass most consistent with primary bronchogenic carcinoma (consider small cell lung cancer). Mass does across the RIGHT oblique fissure into the RIGHT upper lobe. Mass also narrows the RIGHT lower lobe bronchus. 2. Bulky RIGHT lower paratracheal and subcarinal adenopathy. Smaller metastatic nodes in the high RIGHT paratracheal nodal station. 3. No evidence of supraclavicular nodal metastasis. 4. Two peripheral nodules in the RIGHT lower lobe likely metastatic deposits or primary lesion. 5. If multidisciplinary follow up management is desired, this is available in the [facility] [facility] through the [facility] [phone]. These results will be called to the ordering clinician or representative by the Radiologist Assistant, and communication documented in the PACS or [employer] [facility]. Electronically Signed   By: Stewart  Edmunds M.D.   On: 10/11/2019 15:40    Addendum, target 10/12/19 11:42 AM  Oncology has requested that we order a PET scan before they see the patient.  I spoke to radiology and I ordered this to the best of my ability. Electronically Signed  By: Rhone Ozaki, MD On: 10/12/2019 11:42 AM   Follow-up: No follow-ups on file.   No orders of the defined types were placed in this encounter.  Medications Discontinued During This Encounter  Medication Reason  . finasteride (PROSCAR) 5 MG tablet Completed Course  . fluconazole (DIFLUCAN) 100 MG tablet   . insulin NPH Human (HUMULIN N,NOVOLIN N) 100 UNIT/ML injection Completed Course  . insulin regular (NOVOLIN R,HUMULIN R) 100 units/mL injection Completed Course  . predniSONE (DELTASONE) 10 MG tablet Completed Course   Orders Placed This Encounter  Procedures  . CT Chest W Contrast  . NM PET Image Initial (PI) Skull Base To Thigh  . Basic metabolic panel  . CBC with Differential/Platelet  . Hepatic function panel  . Hemoglobin A1c  . TSH  . Ambulatory referral to Thoracic Oncology     Signed,  Dreshaun Stene T. Dakayla Disanti, MD   Outpatient Encounter Medications as of 10/10/2019  Medication Sig  . ampicillin (PRINCIPEN) 500 MG capsule Take 500 mg by mouth daily.   . acetaminophen (QC ACETAMINOPHEN 8HR ARTH PAIN) 650 MG CR tablet Take 650 mg by mouth every 8 (eight) hours as needed for pain.  . Ascorbic Acid (VITAMIN C) 1000 MG tablet Take 1,000 mg by mouth daily.  . aspirin EC 81 MG tablet Take 81 mg by mouth at bedtime.  . atenolol (TENORMIN) 50 MG tablet TAKE 1 TABLET BY MOUTH EVERY DAY  . cholecalciferol (VITAMIN D) 1000 UNITS tablet Take 1,000 Units by mouth daily.    . colchicine 0.6 MG tablet Take 1 tablet (0.6 mg total) by mouth 2 (two) times daily.  . donepezil (ARICEPT) 10 MG tablet TAKE 1 TABLET BY MOUTH AT BEDTIME  . doxazosin (CARDURA) 8 MG tablet TAKE ONE-HALF TABLET AT BEDTIME  . feeding supplement, GLUCERNA SHAKE, (GLUCERNA SHAKE) LIQD Take 237 mLs by mouth 3 (three) times daily between meals.  . fluticasone (FLONASE) 50 MCG/ACT nasal spray PLACE 2 SPRAYS IN EACH NOSTRIL DAILY  . ibuprofen (ADVIL,MOTRIN) 200 MG tablet Take 200-400 mg by mouth every 6 (six) hours as needed for headache (pain).  . indomethacin (INDOCIN) 50 MG capsule   Take 1 capsule (50 mg total) by mouth 2 (two) times daily with a meal.  . Insulin Pen Needle (PEN NEEDLES) 31G X 5 MM MISC 1 each by Does not apply route 3 (three) times daily. To inject insulin E11.42  . insulin regular human CONCENTRATED (HUMULIN R U-500 KWIKPEN) 500 UNIT/ML kwikpen Inject under skin 80-90 units before b'fast, 60 units before lunch, 80-90 units vefore dinner  . lisinopril (ZESTRIL) 10 MG tablet TAKE 1 TABLET BY MOUTH EVERY DAY  . memantine (NAMENDA) 10 MG tablet TAKE 1 TABLET BY MOUTH TWICE A DAY  . montelukast (SINGULAIR) 10 MG tablet Take 1 tablet (10 mg total) by mouth at bedtime.  . nutrition supplement, JUVEN, (JUVEN) PACK Take 1 packet by mouth 2 (two) times daily between meals.  [name] Kitchen omeprazole (PRILOSEC) 40 MG  capsule Take 1 capsule (40 mg total) by mouth 2 (two) times daily before a meal.  . ondansetron (ZOFRAN) 4 MG tablet Take 1 tablet (4 mg total) by mouth every 8 (eight) hours as needed for nausea or vomiting.  . ondansetron (ZOFRAN-ODT) 8 MG disintegrating tablet Take 1 tablet (8 mg total) by mouth 2 (two) times daily.  [name] Kitchen OZEMPIC, 0.25 OR 0.5 MG/DOSE, 2 MG/1.5ML SOPN INJECT 0.5 MG INTO THE SKIN ONCE A WEEK. (Patient not taking: Reported on 09/26/2019)  . sildenafil (REVATIO) 20 MG tablet Generic Revatio / Sildanefil 20 mg. 2 - 5 tabs 30 mins prior to intercourse.  . simvastatin (ZOCOR) 40 MG tablet TAKE 1 TABLET BY MOUTH AT BEDTIME  . Testosterone Cypionate 150 MG/ML SOLN Inject 1 mL into the muscle every 14 (fourteen) days.  . valACYclovir (VALTREX) 1000 MG tablet Take 1,000 mg by mouth 3 (three) times daily.  [name] Kitchen venlafaxine XR (EFFEXOR-XR) 75 MG 24 hr capsule TAKE 1 CAPSULE BY MOUTH EVERY MORNING AND 2 CAPSULES AT BEDTIME  . [DISCONTINUED] finasteride (PROSCAR) 5 MG tablet Take 1 tablet (5 mg total) by mouth daily. (Patient not taking: Reported on 09/26/2019)  . [DISCONTINUED] fluconazole (DIFLUCAN) 100 MG tablet Take 1 tablet (100 mg total) by mouth daily. (Patient not taking: Reported on 09/26/2019)  . [DISCONTINUED] insulin NPH Human (HUMULIN N,NOVOLIN N) 100 UNIT/ML injection Inject 20 Units into the skin 2 (two) times daily before a meal.  (Patient not taking: Reported on 09/26/2019)  . [DISCONTINUED] insulin regular (NOVOLIN R,HUMULIN R) 100 units/mL injection Inject 0.2 mLs (20 Units total) into the skin 2 (two) times daily before a meal. (Patient not taking: Reported on 09/26/2019)  . [DISCONTINUED] predniSONE (DELTASONE) 10 MG tablet Take 1 tablet (10 mg total) by mouth daily with breakfast. 2 tabs let daily for 5 days then 1 tablet daily for 5 days. (Patient not taking: Reported on 09/26/2019)   No facility-administered encounter medications on file as of 10/10/2019.

## 2019-10-11 ENCOUNTER — Other Ambulatory Visit: Payer: Self-pay | Admitting: *Deleted

## 2019-10-11 ENCOUNTER — Ambulatory Visit (INDEPENDENT_AMBULATORY_CARE_PROVIDER_SITE_OTHER)
Admission: RE | Admit: 2019-10-11 | Discharge: 2019-10-11 | Disposition: A | Discharge status: Home / Self Care | Payer: Medicare Other | Source: Ambulatory Visit | Attending: Family Medicine | Admitting: Family Medicine

## 2019-10-11 ENCOUNTER — Telehealth: Payer: Medicare Other

## 2019-10-11 DIAGNOSIS — R918 Other nonspecific abnormal finding of lung field: Secondary | ICD-10-CM | POA: Diagnosis not present

## 2019-10-11 DIAGNOSIS — R0781 Pleurodynia: Secondary | ICD-10-CM

## 2019-10-11 DIAGNOSIS — IMO0002 Reserved for concepts with insufficient information to code with codable children: Secondary | ICD-10-CM

## 2019-10-11 DIAGNOSIS — C3490 Malignant neoplasm of unspecified part of unspecified bronchus or lung: Secondary | ICD-10-CM | POA: Diagnosis not present

## 2019-10-11 DIAGNOSIS — W19XXXA Unspecified fall, initial encounter: Secondary | ICD-10-CM

## 2019-10-11 DIAGNOSIS — Z87891 Personal history of nicotine dependence: Secondary | ICD-10-CM

## 2019-10-11 DIAGNOSIS — N1831 Chronic kidney disease, stage 3a: Secondary | ICD-10-CM

## 2019-10-11 MED ORDER — IOHEXOL 300 MG/ML  SOLN
80.0000 mL | Freq: Once | INTRAMUSCULAR | Status: AC | PRN
  Administered 2019-10-11: 80 mL via INTRAVENOUS

## 2019-10-11 NOTE — Addendum Note (Signed)
Addended by: [name] on: 10/11/2019 04:22 PM   Modules accepted: Orders

## 2019-10-11 NOTE — Progress Notes (Unsigned)
Pet

## 2019-10-12 ENCOUNTER — Telehealth: Payer: Self-pay

## 2019-10-12 ENCOUNTER — Inpatient Hospital Stay (HOSPITAL_COMMUNITY)
Admission: EM | Admit: 2019-10-12 | Discharge: 2019-10-14 | DRG: 181 | Disposition: A | Discharge status: Home / Self Care | Payer: Medicare Other | Attending: Internal Medicine | Admitting: Internal Medicine

## 2019-10-12 ENCOUNTER — Telehealth: Payer: Self-pay | Admitting: *Deleted

## 2019-10-12 ENCOUNTER — Telehealth: Payer: Self-pay | Admitting: Family Medicine

## 2019-10-12 ENCOUNTER — Encounter (HOSPITAL_COMMUNITY): Payer: Self-pay

## 2019-10-12 ENCOUNTER — Emergency Department (HOSPITAL_COMMUNITY): Payer: Medicare Other

## 2019-10-12 ENCOUNTER — Other Ambulatory Visit: Payer: Self-pay

## 2019-10-12 DIAGNOSIS — IMO0002 Reserved for concepts with insufficient information to code with codable children: Secondary | ICD-10-CM | POA: Diagnosis present

## 2019-10-12 DIAGNOSIS — L97529 Non-pressure chronic ulcer of other part of left foot with unspecified severity: Secondary | ICD-10-CM | POA: Diagnosis present

## 2019-10-12 DIAGNOSIS — E1165 Type 2 diabetes mellitus with hyperglycemia: Secondary | ICD-10-CM | POA: Diagnosis not present

## 2019-10-12 DIAGNOSIS — R918 Other nonspecific abnormal finding of lung field: Secondary | ICD-10-CM

## 2019-10-12 DIAGNOSIS — E1140 Type 2 diabetes mellitus with diabetic neuropathy, unspecified: Secondary | ICD-10-CM | POA: Diagnosis present

## 2019-10-12 DIAGNOSIS — E291 Testicular hypofunction: Secondary | ICD-10-CM | POA: Diagnosis present

## 2019-10-12 DIAGNOSIS — K219 Gastro-esophageal reflux disease without esophagitis: Secondary | ICD-10-CM | POA: Diagnosis not present

## 2019-10-12 DIAGNOSIS — F419 Anxiety disorder, unspecified: Secondary | ICD-10-CM | POA: Diagnosis present

## 2019-10-12 DIAGNOSIS — G4733 Obstructive sleep apnea (adult) (pediatric): Secondary | ICD-10-CM | POA: Diagnosis not present

## 2019-10-12 DIAGNOSIS — E11621 Type 2 diabetes mellitus with foot ulcer: Secondary | ICD-10-CM | POA: Diagnosis present

## 2019-10-12 DIAGNOSIS — R710 Precipitous drop in hematocrit: Secondary | ICD-10-CM | POA: Diagnosis not present

## 2019-10-12 DIAGNOSIS — Z8719 Personal history of other diseases of the digestive system: Secondary | ICD-10-CM

## 2019-10-12 DIAGNOSIS — Z7982 Long term (current) use of aspirin: Secondary | ICD-10-CM

## 2019-10-12 DIAGNOSIS — N1831 Chronic kidney disease, stage 3a: Secondary | ICD-10-CM | POA: Diagnosis not present

## 2019-10-12 DIAGNOSIS — J301 Allergic rhinitis due to pollen: Secondary | ICD-10-CM | POA: Diagnosis present

## 2019-10-12 DIAGNOSIS — Z20822 Contact with and (suspected) exposure to covid-19: Secondary | ICD-10-CM | POA: Diagnosis present

## 2019-10-12 DIAGNOSIS — N183 Chronic kidney disease, stage 3 unspecified: Secondary | ICD-10-CM | POA: Diagnosis present

## 2019-10-12 DIAGNOSIS — R042 Hemoptysis: Secondary | ICD-10-CM

## 2019-10-12 DIAGNOSIS — C3490 Malignant neoplasm of unspecified part of unspecified bronchus or lung: Secondary | ICD-10-CM

## 2019-10-12 DIAGNOSIS — Z833 Family history of diabetes mellitus: Secondary | ICD-10-CM | POA: Diagnosis not present

## 2019-10-12 DIAGNOSIS — Z825 Family history of asthma and other chronic lower respiratory diseases: Secondary | ICD-10-CM

## 2019-10-12 DIAGNOSIS — E1122 Type 2 diabetes mellitus with diabetic chronic kidney disease: Secondary | ICD-10-CM | POA: Diagnosis not present

## 2019-10-12 DIAGNOSIS — F3341 Major depressive disorder, recurrent, in partial remission: Secondary | ICD-10-CM | POA: Diagnosis present

## 2019-10-12 DIAGNOSIS — M109 Gout, unspecified: Secondary | ICD-10-CM | POA: Diagnosis present

## 2019-10-12 DIAGNOSIS — C3431 Malignant neoplasm of lower lobe, right bronchus or lung: Secondary | ICD-10-CM | POA: Diagnosis not present

## 2019-10-12 DIAGNOSIS — Z9049 Acquired absence of other specified parts of digestive tract: Secondary | ICD-10-CM

## 2019-10-12 DIAGNOSIS — B370 Candidal stomatitis: Secondary | ICD-10-CM | POA: Diagnosis not present

## 2019-10-12 DIAGNOSIS — Z79899 Other long term (current) drug therapy: Secondary | ICD-10-CM

## 2019-10-12 DIAGNOSIS — F0390 Unspecified dementia without behavioral disturbance: Secondary | ICD-10-CM | POA: Diagnosis present

## 2019-10-12 DIAGNOSIS — E1142 Type 2 diabetes mellitus with diabetic polyneuropathy: Secondary | ICD-10-CM | POA: Diagnosis not present

## 2019-10-12 DIAGNOSIS — I129 Hypertensive chronic kidney disease with stage 1 through stage 4 chronic kidney disease, or unspecified chronic kidney disease: Secondary | ICD-10-CM | POA: Diagnosis not present

## 2019-10-12 DIAGNOSIS — Z9181 History of falling: Secondary | ICD-10-CM

## 2019-10-12 DIAGNOSIS — Z87891 Personal history of nicotine dependence: Secondary | ICD-10-CM

## 2019-10-12 DIAGNOSIS — C3491 Malignant neoplasm of unspecified part of right bronchus or lung: Secondary | ICD-10-CM

## 2019-10-12 DIAGNOSIS — Z96649 Presence of unspecified artificial hip joint: Secondary | ICD-10-CM | POA: Diagnosis present

## 2019-10-12 DIAGNOSIS — Z8249 Family history of ischemic heart disease and other diseases of the circulatory system: Secondary | ICD-10-CM

## 2019-10-12 DIAGNOSIS — Z881 Allergy status to other antibiotic agents status: Secondary | ICD-10-CM | POA: Diagnosis not present

## 2019-10-12 DIAGNOSIS — I10 Essential (primary) hypertension: Secondary | ICD-10-CM | POA: Diagnosis not present

## 2019-10-12 DIAGNOSIS — Z794 Long term (current) use of insulin: Secondary | ICD-10-CM

## 2019-10-12 DIAGNOSIS — Z823 Family history of stroke: Secondary | ICD-10-CM

## 2019-10-12 DIAGNOSIS — L97521 Non-pressure chronic ulcer of other part of left foot limited to breakdown of skin: Secondary | ICD-10-CM | POA: Diagnosis not present

## 2019-10-12 DIAGNOSIS — G3184 Mild cognitive impairment, so stated: Secondary | ICD-10-CM | POA: Diagnosis present

## 2019-10-12 DIAGNOSIS — E785 Hyperlipidemia, unspecified: Secondary | ICD-10-CM | POA: Diagnosis not present

## 2019-10-12 DIAGNOSIS — Z820 Family history of epilepsy and other diseases of the nervous system: Secondary | ICD-10-CM

## 2019-10-12 DIAGNOSIS — E0842 Diabetes mellitus due to underlying condition with diabetic polyneuropathy: Secondary | ICD-10-CM | POA: Diagnosis not present

## 2019-10-12 DIAGNOSIS — Z801 Family history of malignant neoplasm of trachea, bronchus and lung: Secondary | ICD-10-CM

## 2019-10-12 LAB — CBC WITH DIFFERENTIAL/PLATELET
Abs Immature Granulocytes: 0.03 10*3/uL (ref 0.00–0.07)
Basophils Absolute: 0 10*3/uL (ref 0.0–0.1)
Basophils Relative: 0 %
Eosinophils Absolute: 0.1 10*3/uL (ref 0.0–0.5)
Eosinophils Relative: 1 %
HCT: 38.3 % — ABNORMAL LOW (ref 39.0–52.0)
Hemoglobin: 12.5 g/dL — ABNORMAL LOW (ref 13.0–17.0)
Immature Granulocytes: 1 %
Lymphocytes Relative: 14 %
Lymphs Abs: 0.9 10*3/uL (ref 0.7–4.0)
MCH: 27.6 pg (ref 26.0–34.0)
MCHC: 32.6 g/dL (ref 30.0–36.0)
MCV: 84.5 fL (ref 80.0–100.0)
Monocytes Absolute: 0.7 10*3/uL (ref 0.1–1.0)
Monocytes Relative: 12 %
Neutro Abs: 4.4 10*3/uL (ref 1.7–7.7)
Neutrophils Relative %: 72 %
Platelets: 254 10*3/uL (ref 150–400)
RBC: 4.53 MIL/uL (ref 4.22–5.81)
RDW: 13.2 % (ref 11.5–15.5)
WBC: 6.1 10*3/uL (ref 4.0–10.5)
nRBC: 0 % (ref 0.0–0.2)

## 2019-10-12 LAB — BASIC METABOLIC PANEL
Anion gap: 14 (ref 5–15)
BUN: 21 mg/dL (ref 8–23)
CO2: 27 mmol/L (ref 22–32)
Calcium: 8.7 mg/dL — ABNORMAL LOW (ref 8.9–10.3)
Chloride: 91 mmol/L — ABNORMAL LOW (ref 98–111)
Creatinine, Ser: 1.44 mg/dL — ABNORMAL HIGH (ref 0.61–1.24)
GFR calc Af Amer: 55 mL/min — ABNORMAL LOW (ref >60)
GFR calc non Af Amer: 48 mL/min — ABNORMAL LOW (ref >60)
Glucose, Bld: 424 mg/dL — ABNORMAL HIGH (ref 70–99)
Potassium: 4 mmol/L (ref 3.5–5.1)
Sodium: 132 mmol/L — ABNORMAL LOW (ref 135–145)

## 2019-10-12 LAB — TROPONIN I (HIGH SENSITIVITY)
Troponin I (High Sensitivity): 12 ng/L (ref <18)
Troponin I (High Sensitivity): 15 ng/L (ref <18)

## 2019-10-12 LAB — SARS CORONAVIRUS 2 BY RT PCR (HOSPITAL ORDER, PERFORMED IN ~~LOC~~ HOSPITAL LAB): SARS Coronavirus 2: NEGATIVE

## 2019-10-12 MED ORDER — COLCHICINE 0.6 MG PO TABS: 0.6000 mg | ORAL_TABLET | Freq: Two times a day (BID) | ORAL | Status: DC

## 2019-10-12 MED ORDER — INSULIN REGULAR HUMAN (CONC) 500 UNIT/ML ~~LOC~~ SOPN
20.0000 [IU] | PEN_INJECTOR | Freq: Three times a day (TID) | SUBCUTANEOUS | Status: DC
  Administered 2019-10-13 (×2): 20 [IU] via SUBCUTANEOUS
  Filled 2019-10-12: qty 3

## 2019-10-12 MED ORDER — DOXAZOSIN MESYLATE 4 MG PO TABS
4.0000 mg | ORAL_TABLET | Freq: Every day | ORAL | Status: DC
  Administered 2019-10-12 – 2019-10-14 (×3): 4 mg via ORAL
  Filled 2019-10-12 (×3): qty 1

## 2019-10-12 MED ORDER — TESTOSTERONE CYPIONATE 150 MG/ML IJ SOLN: 1.0000 mL | INTRAMUSCULAR | Status: DC

## 2019-10-12 MED ORDER — DONEPEZIL HCL 10 MG PO TABS
10.0000 mg | ORAL_TABLET | Freq: Every day | ORAL | Status: DC
  Administered 2019-10-12 – 2019-10-13 (×2): 10 mg via ORAL
  Filled 2019-10-12 (×2): qty 1

## 2019-10-12 MED ORDER — LISINOPRIL 10 MG PO TABS
10.0000 mg | ORAL_TABLET | Freq: Every day | ORAL | Status: DC
  Administered 2019-10-12 – 2019-10-14 (×3): 10 mg via ORAL
  Filled 2019-10-12 (×4): qty 1

## 2019-10-12 MED ORDER — AMPICILLIN 500 MG PO CAPS
500.0000 mg | ORAL_CAPSULE | Freq: Every day | ORAL | Status: DC
  Administered 2019-10-12 – 2019-10-13 (×2): 500 mg via ORAL
  Filled 2019-10-12 (×2): qty 1

## 2019-10-12 MED ORDER — PANTOPRAZOLE SODIUM 40 MG PO TBEC
40.0000 mg | DELAYED_RELEASE_TABLET | Freq: Every day | ORAL | Status: DC
  Administered 2019-10-12 – 2019-10-14 (×3): 40 mg via ORAL
  Filled 2019-10-12 (×3): qty 1

## 2019-10-12 MED ORDER — VENLAFAXINE HCL ER 150 MG PO CP24
150.0000 mg | ORAL_CAPSULE | Freq: Every day | ORAL | Status: DC
  Administered 2019-10-12 – 2019-10-13 (×2): 150 mg via ORAL
  Filled 2019-10-12 (×2): qty 1

## 2019-10-12 MED ORDER — VENLAFAXINE HCL ER 75 MG PO CP24
75.0000 mg | ORAL_CAPSULE | Freq: Every morning | ORAL | Status: DC
  Administered 2019-10-13 – 2019-10-14 (×2): 75 mg via ORAL
  Filled 2019-10-12 (×2): qty 1

## 2019-10-12 MED ORDER — SIMVASTATIN 20 MG PO TABS
40.0000 mg | ORAL_TABLET | Freq: Every day | ORAL | Status: DC
  Administered 2019-10-12 – 2019-10-14 (×3): 40 mg via ORAL
  Filled 2019-10-12 (×3): qty 2

## 2019-10-12 MED ORDER — INSULIN ASPART 100 UNIT/ML ~~LOC~~ SOLN
0.0000 [IU] | Freq: Every day | SUBCUTANEOUS | Status: DC
  Administered 2019-10-12: 4 [IU] via SUBCUTANEOUS

## 2019-10-12 MED ORDER — SODIUM CHLORIDE 0.9 % IV SOLN: INTRAVENOUS | Status: DC

## 2019-10-12 MED ORDER — OXYCODONE HCL 5 MG PO TABS
5.0000 mg | ORAL_TABLET | Freq: Four times a day (QID) | ORAL | Status: DC | PRN
  Administered 2019-10-12 – 2019-10-13 (×2): 5 mg via ORAL
  Filled 2019-10-12 (×3): qty 1

## 2019-10-12 MED ORDER — INSULIN ASPART 100 UNIT/ML ~~LOC~~ SOLN
0.0000 [IU] | Freq: Three times a day (TID) | SUBCUTANEOUS | Status: DC
  Administered 2019-10-13: 15 [IU] via SUBCUTANEOUS
  Administered 2019-10-13: 4 [IU] via SUBCUTANEOUS

## 2019-10-12 MED ORDER — SILVER SULFADIAZINE 1 % EX CREA
TOPICAL_CREAM | Freq: Every day | CUTANEOUS | Status: DC
  Administered 2019-10-12: 1 via TOPICAL
  Filled 2019-10-12: qty 50

## 2019-10-12 MED ORDER — MEMANTINE HCL 10 MG PO TABS
10.0000 mg | ORAL_TABLET | Freq: Two times a day (BID) | ORAL | Status: DC
  Administered 2019-10-12 – 2019-10-14 (×4): 10 mg via ORAL
  Filled 2019-10-12 (×4): qty 1

## 2019-10-12 MED ORDER — TRANEXAMIC ACID FOR INHALATION
500.0000 mg | Freq: Three times a day (TID) | RESPIRATORY_TRACT | Status: DC
  Administered 2019-10-12 – 2019-10-13 (×2): 500 mg via RESPIRATORY_TRACT
  Filled 2019-10-12 (×3): qty 10

## 2019-10-12 MED ORDER — ATENOLOL 50 MG PO TABS
50.0000 mg | ORAL_TABLET | Freq: Every day | ORAL | Status: DC
  Administered 2019-10-12 – 2019-10-14 (×3): 50 mg via ORAL
  Filled 2019-10-12 (×4): qty 1

## 2019-10-12 MED ORDER — MONTELUKAST SODIUM 10 MG PO TABS
10.0000 mg | ORAL_TABLET | Freq: Every day | ORAL | Status: DC
  Administered 2019-10-12 – 2019-10-13 (×2): 10 mg via ORAL
  Filled 2019-10-12 (×2): qty 1

## 2019-10-12 NOTE — Telephone Encounter (Signed)
Spoke w/pt to inform of PET scan, 10-16-19@  [facility].  Pt to arrive@0730 .  Pt understands to be off his insulin 2hrs prior, no carbohydrates 12hrs prior and only plain water 6 hrs prior to exam.  Pt also wanted to elaborate on size/color of blood that he coughed up in middle of last night.  He had 2 big, dark colored clots; 1 was a a bit bigger than quarter size; the other is "stringy".  This morning had bright red, nickel size clot.  Otherwise, today when he coughs it is mostly clear mucus.  Pt wonders if this is from coughing or his cancer.

## 2019-10-12 NOTE — Addendum Note (Signed)
Addended by: Carter Kitten on: 10/12/2019 01:40 PM   Modules accepted: Orders

## 2019-10-12 NOTE — Telephone Encounter (Signed)
Order was not replaced as a stat.  I have put in new order.

## 2019-10-12 NOTE — Telephone Encounter (Signed)
I received referral on [name].  He states he is not feeling well and its hard to catch his breath.  He is also coughing up blood.  He states he is going to the emergency room and I thought that was a good idea.  I did explain that I have contacted pulmonary team to help with getting tissue dx and made referral to rad onc.  He was thankful for the call and update.

## 2019-10-12 NOTE — ED Provider Notes (Addendum)
[facility] DEPT Provider Note   CSN: [id number] Arrival date & time: 10/12/19  1514     History Chief Complaint  Patient presents with  . Fall  . rib cage pain  . Hemoptysis    [name] is a 74 y.o. male.  Presents to ER with concern for hemoptysis.  Patient reports over the past few weeks has been having intermittent episodes of hemoptysis.  Since last night however the bleeding seems to have worsened.  Throughout the day today he has been coughing up large clots, quarter sized approximately every couple hours.  States that he chronically has some generalized shortness of breath but this has not changed.  No shortness of breath at rest, no new chest pain today.  States that few days ago he tripped over his dog and fell on his left axillary area, has had some pain around ribs since then.  Patient currently being worked up for lung mass.  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  Has not met yet with pulmonology or oncology.  PET scan scheduled for next week.  Former smoker.  Denies history of DVT/PE.  HPI     Past Medical History:  Diagnosis Date  . Allergic rhinitis due to pollen   . Cancer ([place])   . Chronic kidney disease, stage III (moderate) 12/19/2012  . Depression   . Diabetes mellitus ([place])   . Diverticulosis   . Erectile dysfunction associated with type 2 diabetes mellitus ([place]) 05/28/2015  . Former very heavy cigarette smoker (more than 40 per day) 10/07/2014  . GERD (gastroesophageal reflux disease)   . Gout   . Hyperlipidemia   . Hypertension   . Hypogonadism male 06/14/2012  . Iron deficiency   . Memory loss   . Osteoarthritis   . Personal history of colonic polyps    1996    Patient Active Problem List   Diagnosis Date Noted  . PND (post-nasal drip) 09/20/2019  . Dropfoot 03/15/2019  . Mild cognitive impairment 05/08/2018  .  Esophageal dysphagia   . Uncontrolled type 2 diabetes mellitus with peripheral neuropathy ([place]) 05/11/2016  . Erectile dysfunction associated with type 2 diabetes mellitus ([place]) 05/28/2015  . Former very heavy cigarette smoker (more than 40 per day) 10/07/2014  . Chronic kidney disease, stage III (moderate) 12/19/2012  . Hypogonadism male 06/14/2012  . OSA (obstructive sleep apnea) 03/09/2011  . Diabetic neuropathy ([place]) 12/29/2010  . Family history of MI (myocardial infarction) 09/16/2010  . HIP REPLACEMENT, RIGHT, HX OF 02/05/2010  . Hypertriglyceridemia 06/13/2008  . GOUT 06/13/2008  . Major depressive disorder, recurrent episode, in partial remission ([place]) 06/13/2008  . Essential hypertension 06/13/2008  . Allergic rhinitis with postnasal drip 06/13/2008  . GERD 06/13/2008  . OSTEOARTHRITIS 06/13/2008    Past Surgical History:  Procedure Laterality Date  . CARDIAC CATHETERIZATION  11/2011   [facility]  . CHOLECYSTECTOMY  1980  . ESOPHAGOGASTRODUODENOSCOPY (EGD) WITH PROPOFOL N/A 09/27/2017   Procedure: ESOPHAGOGASTRODUODENOSCOPY (EGD) WITH PROPOFOL;  Surgeon: Lin Landsman, MD;  Location: [facility];  Service: Gastroenterology;  Laterality: N/A;  . EYE SURGERY Right 07/29/2016   cataract - Dr. Manuella Ghazi  . EYE SURGERY Left 08/23/2106   cataract - Dr. Manuella Ghazi  . TOTAL HIP ARTHROPLASTY  2009   Dr. Gladstone Lighter  . TOTAL HIP ARTHROPLASTY         Family History  Problem Relation Age of Onset  .  Diabetes Mother   . Alzheimer's disease Mother   . Diabetes Father   . Lung cancer Father        Age 97  . Stroke Father   . Heart attack Father   . Lung cancer Sister        lung  . Heart disease Maternal Grandfather   . COPD Sister   . Heart attack Sister   . Heart disease Sister     Social History   Tobacco Use  . Smoking status: Former Smoker    Packs/day: 3.00    Years: 35.00    Pack years: 105.00    Types: Cigarettes    Quit date: 04/12/2000    Years since quitting: 19.5    . Smokeless tobacco: Never Used  Vaping Use  . Vaping Use: Never used  Substance Use Topics  . Alcohol use: No  . Drug use: No    Home Medications Prior to Admission medications   Medication Sig Start Date End Date Taking? Authorizing Provider  acetaminophen (QC ACETAMINOPHEN 8HR ARTH PAIN) 650 MG CR tablet Take 650 mg by mouth every 8 (eight) hours as needed for pain.    [provider]  ampicillin (PRINCIPEN) 500 MG capsule Take 500 mg by mouth daily.     [provider]  Ascorbic Acid (VITAMIN C) 1000 MG tablet Take 1,000 mg by mouth daily.    [provider]  aspirin EC 81 MG tablet Take 81 mg by mouth at bedtime.    [provider]  atenolol (TENORMIN) 50 MG tablet TAKE 1 TABLET BY MOUTH EVERY DAY 08/09/19   Copland, Karleen Hampshire, MD  cholecalciferol (VITAMIN D) 1000 UNITS tablet Take 1,000 Units by mouth daily.      [provider]  colchicine 0.6 MG tablet Take 1 tablet (0.6 mg total) by mouth 2 (two) times daily. 07/12/18   Copland, Karleen Hampshire, MD  donepezil (ARICEPT) 10 MG tablet TAKE 1 TABLET BY MOUTH AT BEDTIME 08/24/19   Copland, Karleen Hampshire, MD  doxazosin (CARDURA) 8 MG tablet TAKE ONE-HALF TABLET AT BEDTIME 01/26/19   Copland, Spencer, MD  feeding supplement, GLUCERNA SHAKE, (GLUCERNA SHAKE) LIQD Take 237 mLs by mouth 3 (three) times daily between meals. 04/08/19   Rolly Salter, MD  fluticasone Aleda Grana) 50 MCG/ACT nasal spray PLACE 2 SPRAYS IN EACH NOSTRIL DAILY 01/26/19   Copland, Karleen Hampshire, MD  ibuprofen (ADVIL,MOTRIN) 200 MG tablet Take 200-400 mg by mouth every 6 (six) hours as needed for headache (pain).    [provider]  indomethacin (INDOCIN) 50 MG capsule Take 1 capsule (50 mg total) by mouth 2 (two) times daily with a meal. 07/12/18   Copland, Karleen Hampshire, MD  Insulin Pen Needle (PEN NEEDLES) 31G X 5 MM MISC 1 each by Does not apply route 3 (three) times daily. To inject insulin E11.42 09/20/19   Carlus Pavlov, MD  insulin  regular human CONCENTRATED (HUMULIN R U-500 KWIKPEN) 500 UNIT/ML kwikpen Inject under skin 80-90 units before b'fast, 60 units before lunch, 80-90 units vefore dinner 09/17/19   Carlus Pavlov, MD  lisinopril (ZESTRIL) 10 MG tablet TAKE 1 TABLET BY MOUTH EVERY DAY 01/26/19   Copland, Karleen Hampshire, MD  memantine (NAMENDA) 10 MG tablet TAKE 1 TABLET BY MOUTH TWICE A DAY 09/20/19   Copland, Karleen Hampshire, MD  montelukast (SINGULAIR) 10 MG tablet Take 1 tablet (10 mg total) by mouth at bedtime. 09/20/19   Excell Seltzer, MD  nutrition supplement, JUVEN, (JUVEN) PACK Take 1 packet by  mouth 2 (two) times daily between meals. 04/08/19   Lavina Hamman, MD  omeprazole (PRILOSEC) 40 MG capsule Take 1 capsule (40 mg total) by mouth 2 (two) times daily before a meal. 08/21/18 04/06/28  Copland, Frederico Hamman, MD  ondansetron (ZOFRAN) 4 MG tablet Take 1 tablet (4 mg total) by mouth every 8 (eight) hours as needed for nausea or vomiting. 08/21/18   Copland, Frederico Hamman, MD  ondansetron (ZOFRAN-ODT) 8 MG disintegrating tablet Take 1 tablet (8 mg total) by mouth 2 (two) times daily. 04/18/19   Copland, Spencer, MD  OZEMPIC, 0.25 OR 0.5 MG/DOSE, 2 MG/1.5ML SOPN INJECT 0.5 MG INTO THE SKIN ONCE A WEEK. Patient not taking: Reported on 09/26/2019 08/17/19   Philemon Kingdom, MD  sildenafil (REVATIO) 20 MG tablet Generic Revatio / Sildanefil 20 mg. 2 - 5 tabs 30 mins prior to intercourse. 04/13/18   Copland, Frederico Hamman, MD  simvastatin (ZOCOR) 40 MG tablet TAKE 1 TABLET BY MOUTH AT BEDTIME 04/30/19   Copland, Frederico Hamman, MD  Testosterone Cypionate 150 MG/ML SOLN Inject 1 mL into the muscle every 14 (fourteen) days. 09/07/19   Copland, Frederico Hamman, MD  valACYclovir (VALTREX) 1000 MG tablet Take 1,000 mg by mouth 3 (three) times daily. 08/17/18   [provider]  venlafaxine XR (EFFEXOR-XR) 75 MG 24 hr capsule TAKE 1 CAPSULE BY MOUTH EVERY MORNING AND 2 CAPSULES AT BEDTIME 01/27/19   Copland, Frederico Hamman, MD    Allergies    Tetracycline  Review of Systems    Review of Systems  Constitutional: Negative for chills and fever.  HENT: Negative for ear pain and sore throat.   Eyes: Negative for pain and visual disturbance.  Respiratory: Negative for cough and shortness of breath.   Cardiovascular: Negative for chest pain and palpitations.  Gastrointestinal: Negative for abdominal pain and vomiting.  Genitourinary: Negative for dysuria and hematuria.  Musculoskeletal: Negative for arthralgias and back pain.  Skin: Negative for color change and rash.  Neurological: Negative for seizures and syncope.  All other systems reviewed and are negative.   Physical Exam Updated Vital Signs BP (!) 167/95   Pulse (!) 103   Temp 97.7 F (36.5 C) (Oral)   Resp (!) 24   Ht 5' 11.5" (1.816 m)   Wt 90.7 kg   SpO2 97%   BMI 27.51 kg/m   Physical Exam  ED Results / Procedures / Treatments   Labs (all labs ordered are listed, but only abnormal results are displayed) Labs Reviewed  CBC WITH DIFFERENTIAL/PLATELET - Abnormal; Notable for the following components:      Result Value   Hemoglobin 12.5 (*)    HCT 38.3 (*)    All other components within normal limits  BASIC METABOLIC PANEL - Abnormal; Notable for the following components:   Sodium 132 (*)    Chloride 91 (*)    Glucose, Bld 424 (*)    Creatinine, Ser 1.44 (*)    Calcium 8.7 (*)    GFR calc non Af Amer 48 (*)    GFR calc Af Amer 55 (*)    All other components within normal limits  SARS CORONAVIRUS 2 BY RT PCR (HOSPITAL ORDER, [facility])  TROPONIN I (HIGH SENSITIVITY)  TROPONIN I (HIGH SENSITIVITY)    EKG None  Radiology DG Chest 2 View  Result Date: 10/12/2019 CLINICAL DATA:  Hemoptysis, fall EXAM: CHEST - 2 VIEW COMPARISON:  Chest x-ray 10/10/2019, CT chest 10/11/2019 FINDINGS: No pleural effusion or pneumothorax. No interval consolidation.  Similar right hilar mass and adjacent adenopathy. Normal heart size. No pneumothorax. Degenerative changes of the  spine. Rounded metallic density superiorly on lateral view appears external to the patient on frontal view. IMPRESSION: Similar right hilar mass and adjacent adenopathy. No acute interval changes. Electronically Signed   By: Donavan Foil M.D.   On: 10/12/2019 17:48   CT Chest W Contrast  Result Date: 10/11/2019 CLINICAL DATA:  Abnormal chest x-ray with RIGHT hilar mass EXAM: CT CHEST WITH CONTRAST TECHNIQUE: Multidetector CT imaging of the chest was performed during intravenous contrast administration. CONTRAST:  72m OMNIPAQUE IOHEXOL 300 MG/ML  SOLN COMPARISON:  Chest radiograph 10/10/2018 FINDINGS: Cardiovascular: No significant vascular findings. Normal heart size. No pericardial effusion. Mediastinum/Nodes: No axillary supraclavicular adenopathy. The small benign appearing cyst in the LEFT lobe of thyroid gland measuring 10 mm. Bulky RIGHT paratracheal adenopathy. RIGHT lower paratracheal lymph node measures 32 mm short axis. Smaller high RIGHT paratracheal lymph nodes measuring 6 mm on image 23/2. Large subcarinal lymph node measuring 21 mm. Large hilar /perihilar mass posterior to the RIGHT hilum along the bronchus intermedius and RIGHT lower lobe bronchus measures 5.5 by 5.6 cm and demonstrates uniform enhancement. Smaller pleural base nodule measuring 2.5 cm posterior to the mass on image 53/2. Additional peripheral nodule in the RIGHT lower lobe along the pleural surface measures 13 mm on image 76/3. Lungs/Pleura: RIGHT lower lobe/RIGHT hilar mass described in the mediastinal section. Mass does span the oblique fissure into the RIGHT upper lobe seen on sagittal imaging Upper Abdomen: Limited view of the liver, kidneys, pancreas are unremarkable. Normal adrenal glands. Musculoskeletal: No aggressive osseous lesion IMPRESSION: 1. Large RIGHT lower lobe/RIGHT hilar mass most consistent with primary bronchogenic carcinoma (consider small cell lung cancer). Mass does across the RIGHT oblique fissure into  the RIGHT upper lobe. Mass also narrows the RIGHT lower lobe bronchus. 2. Bulky RIGHT lower paratracheal and subcarinal adenopathy. Smaller metastatic nodes in the high RIGHT paratracheal nodal station. 3. No evidence of supraclavicular nodal metastasis. 4. Two peripheral nodules in the RIGHT lower lobe likely metastatic deposits or primary lesion. 5. If multidisciplinary follow up management is desired, [place] is available in the [facility] [phone] These results will be called to the ordering clinician or representative by the Radiologist Assistant, and communication documented in the PACS or [employer] Electronically Signed   By: SSuzy BouchardM.D.   On: 10/11/2019 15:40    Procedures Procedures (including critical care time)  Medications Ordered in ED Medications - No data to display  ED Course  I have reviewed the triage vital signs and the nursing notes.  Pertinent labs & imaging results that were available during my care of the patient were reviewed by me and considered in my medical decision making (see chart for details).  Clinical Course as of Oct 11 1845  Fri Oct 12, 2019  1845 Discussed case with pulmonology, she will review with attending, they will consult, recommends hospitalist admission   [RD]    Clinical Course User Index [RD] DLucrezia Starch MD   MDM Rules/Calculators/A&P                         74year old male currently undergoing outpatient work-up for right lung mass presented to ER with concern for increasing hemoptysis.  On exam here, patient is well-appearing with stable vital signs.  Labs grossly normal, CXR unchanged from CT yesterday.  Suspect lung mass  likely etiology for his hemoptysis.  Reviewed case with pulmonology - Merlene Laughter PA, they recommended admitting patient, they will see in consultation, likely biopsy/bronchoscopy tomorrow.  Will consult hosp for admit.  Final Clinical  Impression(s) / ED Diagnoses Final diagnoses:  Hemoptysis  Lung mass    Rx / DC Orders ED Discharge Orders    None       Lucrezia Starch, MD 10/12/19 1847    Lucrezia Starch, MD 10/12/19 [id number]

## 2019-10-12 NOTE — Telephone Encounter (Signed)
[facility] [place] link / epic is not allowing me to place notes on this patient's CT results. I spoke to Radiology:  I have ordered [name]'s PET scan to the best of my ability, but it is possible that Oncology may need a different study. I ordered a PET scan skull base to thigh.  Electronically Signed  By: [name], MD On: 10/12/2019 11:49 AM   CLINICAL DATA:  Abnormal chest x-ray with RIGHT hilar mass   EXAM: CT CHEST WITH CONTRAST   TECHNIQUE: Multidetector CT imaging of the chest was performed during intravenous contrast administration.   CONTRAST:  24mL OMNIPAQUE IOHEXOL 300 MG/ML  SOLN   COMPARISON:  Chest radiograph 10/10/2018   FINDINGS: Cardiovascular: No significant vascular findings. Normal heart size. No pericardial effusion.   Mediastinum/Nodes: No axillary supraclavicular adenopathy. The small benign appearing cyst in the LEFT lobe of thyroid gland measuring 10 mm.   Bulky RIGHT paratracheal adenopathy. RIGHT lower paratracheal lymph node measures 32 mm short axis. Smaller high RIGHT paratracheal lymph nodes measuring 6 mm on image 23/2. Large subcarinal lymph node measuring 21 mm.   Large hilar /perihilar mass posterior to the RIGHT hilum along the bronchus intermedius and RIGHT lower lobe bronchus measures 5.5 by 5.6 cm and demonstrates uniform enhancement. Smaller pleural base nodule measuring 2.5 cm posterior to the mass on image 53/2. Additional peripheral nodule in the RIGHT lower lobe along the pleural surface measures 13 mm on image 76/3.   Lungs/Pleura: RIGHT lower lobe/RIGHT hilar mass described in the mediastinal section. Mass does span the oblique fissure into the RIGHT upper lobe seen on sagittal imaging   Upper Abdomen: Limited view of the liver, kidneys, pancreas are unremarkable. Normal adrenal glands.   Musculoskeletal: No aggressive osseous lesion   IMPRESSION: 1. Large RIGHT lower lobe/RIGHT hilar mass most consistent with primary  bronchogenic carcinoma (consider small cell lung cancer). Mass does across the RIGHT oblique fissure into the RIGHT upper lobe. Mass also narrows the RIGHT lower lobe bronchus. 2. Bulky RIGHT lower paratracheal and subcarinal adenopathy. Smaller metastatic nodes in the high RIGHT paratracheal nodal station. 3. No evidence of supraclavicular nodal metastasis. 4. Two peripheral nodules in the RIGHT lower lobe likely metastatic deposits or primary lesion. 5. If multidisciplinary follow up management is desired, this is available in the [place] through the [facility] [phone].   These results will be called to the ordering clinician or representative by the Radiologist Assistant, and communication documented in the PACS or [employer].     Electronically Signed   By: Suzy Bouchard M.D.   On: 10/11/2019 15:40

## 2019-10-12 NOTE — ED Notes (Signed)
Pt says his current blood glucose is 355. He has a [employer] continuous glucose monitoring system. Take Humulin U500.

## 2019-10-12 NOTE — Addendum Note (Signed)
Addended by: [name] on: 10/12/2019 11:43 AM   Modules accepted: Orders

## 2019-10-12 NOTE — ED Triage Notes (Signed)
Patient states he tripped over his dog and fell hitting a dresser on his left axillary area. Patient states he had x-rays completed. Patient states he has been having hemoptysis x several weeks ago. Patient states the bleeding has worsened in the past few days. Patient states he was diagnosed with lung cancer 3 days ago.

## 2019-10-12 NOTE — Telephone Encounter (Signed)
Pt said for 2 wks coughing up small amts of blood but last night started coughing up large amts of bright red and dark red blood clots; pt was going to go to ED but did not want to have to wait a long time there so pt did not go. Pt wanted to know how the specialist appt in [place] was coming. I spoke with Charmaine [facility] and she said she had spoke with Jenny Reichmann in multidisciplinary thoracic oncology clinic, (appt not confirmed yet)and pt was going to need a PET scan also.  Pt having SOB all the time but worse when coughs up blood. Pt was advised to go to ED; pt said cough up blood was not as bad this morning as last night and if worsens again pt will go to ED for eval. FYI to Dr Lorelei Pont.

## 2019-10-12 NOTE — H&P (Signed)
History and Physical    [name] JOI:[id number] DOB: 26-Oct-1945 DOA: 10/12/2019  PCP: [name], MD  Patient coming from: home   Chief Complaint: hemoptysis  HPI: [name] is a 74 y.o. male with medical history significant for htn, ckd 3, poorly controlled insulin-dependent dm, gout, osa, hypogonadism, chronic left foot ulcer, who presents with the above.  Symptoms began several weeks ago. Was eating ice cream, swallowed wrong, and coughed a small amount of blood. Since then has had intermittent cough with blood. Initially mild, then "small bits of blood," and for the last two days larger quarter-sized clots coughed up every few hours. Otherwise feeling well, no sob. Did fall at home last week and has pain left side of chest, isn't exertional. No vomiting or diarrhea. No fevers. No smoking last 20 years but has a 60 pack-year history.  Saw PCP last week after the fall at home. CXR performed showed lung mass. CT yesterday showed RLL/hilar large mass, likely bronchogenic carcinoma, with paratracheal adenopathy and RLL lesions suspicious for mets. Given worsening hemoptysis, presented to the ED. In the ED, the EDP discussed w/ pulmonology, who advised admission.   Review of Systems: As per HPI otherwise 10 point review of systems negative.    Past Medical History:  Diagnosis Date  . Allergic rhinitis due to pollen   . Cancer ([place])   . Chronic kidney disease, stage III (moderate) 12/19/2012  . Depression   . Diabetes mellitus ([place])   . Diverticulosis   . Erectile dysfunction associated with type 2 diabetes mellitus ([place]) 05/28/2015  . Former very heavy cigarette smoker (more than 40 per day) 10/07/2014  . GERD (gastroesophageal reflux disease)   . Gout   . Hyperlipidemia   . Hypertension   . Hypogonadism male 06/14/2012  . Iron deficiency   . Memory loss   . Osteoarthritis   . Personal history of colonic polyps    1996    Past Surgical History:  Procedure  Laterality Date  . CARDIAC CATHETERIZATION  11/2011   [facility]  . CHOLECYSTECTOMY  1980  . ESOPHAGOGASTRODUODENOSCOPY (EGD) WITH PROPOFOL N/A 09/27/2017   Procedure: ESOPHAGOGASTRODUODENOSCOPY (EGD) WITH PROPOFOL;  Surgeon: Lin Landsman, MD;  Location: [facility];  Service: Gastroenterology;  Laterality: N/A;  . EYE SURGERY Right 07/29/2016   cataract - Dr. Manuella Ghazi  . EYE SURGERY Left 08/23/2106   cataract - Dr. Manuella Ghazi  . TOTAL HIP ARTHROPLASTY  2009   Dr. Gladstone Lighter  . TOTAL HIP ARTHROPLASTY       reports that he quit smoking about 19 years ago. His smoking use included cigarettes. He has a 105.00 pack-year smoking history. He has never used smokeless tobacco. He reports that he does not drink alcohol and does not use drugs.  Allergies  Allergen Reactions  . Tetracycline Itching and Rash    Family History  Problem Relation Age of Onset  . Diabetes Mother   . Alzheimer's disease Mother   . Diabetes Father   . Lung cancer Father        Age 66  . Stroke Father   . Heart attack Father   . Lung cancer Sister        lung  . Heart disease Maternal Grandfather   . COPD Sister   . Heart attack Sister   . Heart disease Sister     Prior to Admission medications   Medication Sig Start Date End Date Taking? Authorizing Provider  acetaminophen (QC ACETAMINOPHEN 8HR ARTH PAIN)  650 MG CR tablet Take 650 mg by mouth every 8 (eight) hours as needed for pain.   Yes [provider]  ampicillin (PRINCIPEN) 500 MG capsule Take 500 mg by mouth daily.    Yes [provider]  Ascorbic Acid (VITAMIN C) 1000 MG tablet Take 1,000 mg by mouth daily.   Yes [provider]  aspirin EC 81 MG tablet Take 81 mg by mouth at bedtime.   Yes [provider]  atenolol (TENORMIN) 50 MG tablet TAKE 1 TABLET BY MOUTH EVERY DAY Patient taking differently: Take 50 mg by mouth daily.  08/09/19  Yes Copland, Frederico Hamman, MD  cholecalciferol (VITAMIN D) 1000 UNITS tablet Take 1,000 Units  by mouth daily.     Yes [provider]  doxazosin (CARDURA) 8 MG tablet TAKE ONE-HALF TABLET AT BEDTIME Patient taking differently: Take 4 mg by mouth daily.  01/26/19  Yes Copland, Frederico Hamman, MD  fluticasone (FLONASE) 50 MCG/ACT nasal spray PLACE 2 SPRAYS IN EACH NOSTRIL DAILY Patient taking differently: Place 2 sprays into both nostrils daily as needed for allergies or rhinitis.  01/26/19  Yes Copland, Frederico Hamman, MD  Hyprom-Naphaz-Polysorb-Zn Sulf (CLEAR EYES COMPLETE OP) Place 1 drop into both eyes daily as needed (dry eyes).   Yes [provider]  insulin regular human CONCENTRATED (HUMULIN R U-500 KWIKPEN) 500 UNIT/ML kwikpen Inject under skin 80-90 units before b'fast, 60 units before lunch, 80-90 units vefore dinner Patient taking differently: Inject 20-40 Units into the skin See admin instructions. Takes 35-40 units before breakfast, 20 units at lunch and 35-40 units at night 09/17/19  Yes Philemon Kingdom, MD  lisinopril (ZESTRIL) 10 MG tablet TAKE 1 TABLET BY MOUTH EVERY DAY Patient taking differently: Take 10 mg by mouth daily.  01/26/19  Yes Copland, Frederico Hamman, MD  memantine (NAMENDA) 10 MG tablet TAKE 1 TABLET BY MOUTH TWICE A DAY Patient taking differently: Take 10 mg by mouth 2 (two) times daily.  09/20/19  Yes Copland, Frederico Hamman, MD  montelukast (SINGULAIR) 10 MG tablet Take 1 tablet (10 mg total) by mouth at bedtime. 09/20/19  Yes Bedsole, Amy E, MD  omeprazole (PRILOSEC) 40 MG capsule Take 1 capsule (40 mg total) by mouth 2 (two) times daily before a meal. Patient taking differently: Take 40 mg by mouth daily.  08/21/18 04/06/28 Yes Copland, Frederico Hamman, MD  simvastatin (ZOCOR) 40 MG tablet TAKE 1 TABLET BY MOUTH AT BEDTIME Patient taking differently: Take 40 mg by mouth daily.  04/30/19  Yes Copland, Frederico Hamman, MD  Testosterone Cypionate 150 MG/ML SOLN Inject 1 mL into the muscle every 14 (fourteen) days. 09/07/19  Yes Copland, Frederico Hamman, MD  venlafaxine XR (EFFEXOR-XR) 75 MG 24 hr  capsule TAKE 1 CAPSULE BY MOUTH EVERY MORNING AND 2 CAPSULES AT BEDTIME Patient taking differently: Take 75 mg by mouth See admin instructions. Takes 1 capsule by mouth in the morning and 2 capsules at night. 01/27/19  Yes Copland, Frederico Hamman, MD  colchicine 0.6 MG tablet Take 1 tablet (0.6 mg total) by mouth 2 (two) times daily. Patient not taking: Reported on 10/12/2019 07/12/18   Copland, Frederico Hamman, MD  donepezil (ARICEPT) 10 MG tablet TAKE 1 TABLET BY MOUTH AT BEDTIME Patient taking differently: Take 10 mg by mouth at bedtime.  08/24/19   Copland, Frederico Hamman, MD  feeding supplement, GLUCERNA SHAKE, (GLUCERNA SHAKE) LIQD Take 237 mLs by mouth 3 (three) times daily between meals. Patient not taking: Reported on 10/12/2019 04/08/19   Lavina Hamman, MD  indomethacin (INDOCIN) 50 MG  capsule Take 1 capsule (50 mg total) by mouth 2 (two) times daily with a meal. Patient not taking: Reported on 10/12/2019 07/12/18   Copland, Frederico Hamman, MD  Insulin Pen Needle (PEN NEEDLES) 31G X 5 MM MISC 1 each by Does not apply route 3 (three) times daily. To inject insulin E11.42 09/20/19   Philemon Kingdom, MD  nutrition supplement, JUVEN, (JUVEN) PACK Take 1 packet by mouth 2 (two) times daily between meals. Patient not taking: Reported on 10/12/2019 04/08/19   Lavina Hamman, MD  ondansetron (ZOFRAN) 4 MG tablet Take 1 tablet (4 mg total) by mouth every 8 (eight) hours as needed for nausea or vomiting. Patient not taking: Reported on 10/12/2019 08/21/18   Copland, Frederico Hamman, MD  ondansetron (ZOFRAN-ODT) 8 MG disintegrating tablet Take 1 tablet (8 mg total) by mouth 2 (two) times daily. Patient not taking: Reported on 10/12/2019 04/18/19   Copland, Frederico Hamman, MD  OZEMPIC, 0.25 OR 0.5 MG/DOSE, 2 MG/1.5ML SOPN INJECT 0.5 MG INTO THE SKIN ONCE A WEEK. Patient not taking: Reported on 09/26/2019 08/17/19   Philemon Kingdom, MD  sildenafil (REVATIO) 20 MG tablet Generic Revatio / Sildanefil 20 mg. 2 - 5 tabs 30 mins prior to intercourse. Patient not  taking: Reported on 10/12/2019 04/13/18   [name], MD    Physical Exam: Vitals:   10/12/19 1736 10/12/19 1845 10/12/19 1848 10/12/19 1900  BP: (!) 167/95 (!) 199/100 (!) 200/103 (!) 195/94  Pulse: (!) 103 (!) 107 (!) 105 (!) 102  Resp: (!) 24 19 18 16   Temp:   97.9 F (36.6 C)   TempSrc:   Oral   SpO2: 97% 98% 96% 97%  Weight:      Height:        Constitutional: No acute distress Head: Atraumatic Eyes: Conjunctiva clear ENM: Moist mucous membranes. Normal dentition.  Neck: Supple Respiratory: slightly decreased breath sounds right midsection, otherwise clear Cardiovascular: Regular rate and rhythm. Soft systolic murmur Abdomen: Non-tender, non-distended. No masses. No rebound or guarding. Positive bowel sounds. Musculoskeletal: No joint deformity upper and lower extremities. Normal ROM, no contractures. Normal muscle tone.  Skin: No rashes, lesions, shallow ulcer base of left 5th metatarsal Extremities: No peripheral edema. Palpable peripheral pulses. Neurologic: Alert, moving all 4 extremities. Psychiatric: Normal insight and judgement.   Labs on Admission: I have personally reviewed following labs and imaging studies  CBC: Recent Labs  Lab 10/10/19 1221 10/12/19 1727  WBC 11.1* 6.1  NEUTROABS 8.7* 4.4  HGB 12.4* 12.5*  HCT 37.5* 38.3*  MCV 84.5 84.5  PLT 230.0 811   Basic Metabolic Panel: Recent Labs  Lab 10/10/19 1221 10/12/19 1727  NA 131* 132*  K 4.8 4.0  CL 93* 91*  CO2 27 27  GLUCOSE 354* 424*  BUN 16 21  CREATININE 1.52* 1.44*  CALCIUM 9.5 8.7*   GFR: Estimated Creatinine Clearance: 49.4 mL/min (A) (by C-G formula based on SCr of 1.44 mg/dL (H)). Liver Function Tests: Recent Labs  Lab 10/10/19 1221  AST 12  ALT 11  ALKPHOS 89  BILITOT 0.7  PROT 7.6  ALBUMIN 3.8   No results for input(s): LIPASE, AMYLASE in the last 168 hours. No results for input(s): AMMONIA in the last 168 hours. Coagulation Profile: No results for input(s):  INR, PROTIME in the last 168 hours. Cardiac Enzymes: No results for input(s): CKTOTAL, CKMB, CKMBINDEX, TROPONINI in the last 168 hours. BNP (last 3 results) No results for input(s): PROBNP in the last 8760 hours. HbA1C: Recent Labs  10/10/19 1221  HGBA1C 10.6*   CBG: No results for input(s): GLUCAP in the last 168 hours. Lipid Profile: No results for input(s): CHOL, HDL, LDLCALC, TRIG, CHOLHDL, LDLDIRECT in the last 72 hours. Thyroid Function Tests: Recent Labs    10/10/19 1221  TSH 1.42   Anemia Panel: No results for input(s): VITAMINB12, FOLATE, FERRITIN, TIBC, IRON, RETICCTPCT in the last 72 hours. Urine analysis:    Component Value Date/Time   COLORURINE YELLOW 04/06/2019 2347   APPEARANCEUR HAZY (A) 04/06/2019 2347   LABSPEC 1.009 04/06/2019 2347   PHURINE 5.0 04/06/2019 2347   GLUCOSEU >=500 (A) 04/06/2019 2347   HGBUR LARGE (A) 04/06/2019 2347   BILIRUBINUR NEGATIVE 04/06/2019 2347   BILIRUBINUR 1+ 09/10/2014 1615   KETONESUR 5 (A) 04/06/2019 2347   PROTEINUR 100 (A) 04/06/2019 2347   UROBILINOGEN 0.2 12/05/2014 2146   NITRITE NEGATIVE 04/06/2019 2347   LEUKOCYTESUR NEGATIVE 04/06/2019 2347    Radiological Exams on Admission: DG Chest 2 View  Result Date: 10/12/2019 CLINICAL DATA:  Hemoptysis, fall EXAM: CHEST - 2 VIEW COMPARISON:  Chest x-ray 10/10/2019, CT chest 10/11/2019 FINDINGS: No pleural effusion or pneumothorax. No interval consolidation. Similar right hilar mass and adjacent adenopathy. Normal heart size. No pneumothorax. Degenerative changes of the spine. Rounded metallic density superiorly on lateral view appears external to the patient on frontal view. IMPRESSION: Similar right hilar mass and adjacent adenopathy. No acute interval changes. Electronically Signed   By: Donavan Foil M.D.   On: 10/12/2019 17:48   CT Chest W Contrast  Result Date: 10/11/2019 CLINICAL DATA:  Abnormal chest x-ray with RIGHT hilar mass EXAM: CT CHEST WITH CONTRAST  TECHNIQUE: Multidetector CT imaging of the chest was performed during intravenous contrast administration. CONTRAST:  68mL OMNIPAQUE IOHEXOL 300 MG/ML  SOLN COMPARISON:  Chest radiograph 10/10/2018 FINDINGS: Cardiovascular: No significant vascular findings. Normal heart size. No pericardial effusion. Mediastinum/Nodes: No axillary supraclavicular adenopathy. The small benign appearing cyst in the LEFT lobe of thyroid gland measuring 10 mm. Bulky RIGHT paratracheal adenopathy. RIGHT lower paratracheal lymph node measures 32 mm short axis. Smaller high RIGHT paratracheal lymph nodes measuring 6 mm on image 23/2. Large subcarinal lymph node measuring 21 mm. Large hilar /perihilar mass posterior to the RIGHT hilum along the bronchus intermedius and RIGHT lower lobe bronchus measures 5.5 by 5.6 cm and demonstrates uniform enhancement. Smaller pleural base nodule measuring 2.5 cm posterior to the mass on image 53/2. Additional peripheral nodule in the RIGHT lower lobe along the pleural surface measures 13 mm on image 76/3. Lungs/Pleura: RIGHT lower lobe/RIGHT hilar mass described in the mediastinal section. Mass does span the oblique fissure into the RIGHT upper lobe seen on sagittal imaging Upper Abdomen: Limited view of the liver, kidneys, pancreas are unremarkable. Normal adrenal glands. Musculoskeletal: No aggressive osseous lesion IMPRESSION: 1. Large RIGHT lower lobe/RIGHT hilar mass most consistent with primary bronchogenic carcinoma (consider small cell lung cancer). Mass does across the RIGHT oblique fissure into the RIGHT upper lobe. Mass also narrows the RIGHT lower lobe bronchus. 2. Bulky RIGHT lower paratracheal and subcarinal adenopathy. Smaller metastatic nodes in the high RIGHT paratracheal nodal station. 3. No evidence of supraclavicular nodal metastasis. 4. Two peripheral nodules in the RIGHT lower lobe likely metastatic deposits or primary lesion. 5. If multidisciplinary follow up management is desired,  this is available in the [facility] through the [facility] [phone]. These results will be called to the ordering clinician or representative by the Radiologist Assistant, and communication documented  in the PACS or [employer]. Electronically Signed   By: Suzy Bouchard M.D.   On: 10/11/2019 15:40     Assessment/Plan Principal Problem:   Lung cancer ([place]) Active Problems:   GOUT   Major depressive disorder, recurrent episode, in partial remission ([place])   Essential hypertension   Diabetic neuropathy ([facility])   OSA (obstructive sleep apnea)   Hypogonadism male   Chronic kidney disease, stage III (moderate)   Former very heavy cigarette smoker (more than 40 per day)   Uncontrolled type 2 diabetes mellitus with peripheral neuropathy ([place])   Diabetic foot ulcer ([place])   Mild cognitive impairment   Hemoptysis    # Lung cancer with hemoptysis - CT showing probable bronchogenic carcinoma with probable mets to RLL and paratracheal nodes. Now with worsening hemoptysis. Hemodynamically stable, h/h stable, no respiratory distress. Pulmonology consulted, formal recs pending but they have ordered nebulized txa, and provisional plan is bronchoscopy tomorrow. - cont txa - appreciate formal pulm recs when present - npo @ midnight  # T2DM - poorly controlled, very insulin insensitive. On sliding scale of u-500 at home, says typically takes 35 units w/ breakfast, 25 with lunch, and 35 with dinner. Here glucose in 400s, no signs dka/hhs - SSI resistant, will order u-500 20 units tid, will benefit from consult to diabetes RN for further recs. rx daily glucose checks - hold home trulicity  # HTN - here htn elevated to 200s, hasn't taken today's meds, asymptomatic. - resume home meds atenolol, cardura, lisinopril, atorvastatin, monitor - hold home asa  # Hypogonadism - overdue for q 14 day testosterone injection (due 6/30), will order  # OSA - not on cpap at  home  # Gout - cont colchicine  # Chronic skin infections - says has been prescribed daily ampicillin for at least 5 years - continue amp  # Mild cognitive impairment  - cont home aricept, namenda  # Anxiety - cont effexor  # Allergies - cont home singulair   DVT prophylaxis: SCDs Code Status: full   Family Communication: son/grandson are primary contacts  Disposition Plan: tbd  Consults called: pulm  Admission status: med/surg    Desma Maxim MD [facility] Pager [phone]  If 7PM-7AM, please contact night-coverage [email or web address] Password [facility]  10/12/2019, 8:14 PM

## 2019-10-12 NOTE — ED Notes (Signed)
ED TO INPATIENT HANDOFF REPORT  ED Nurse Name and Phone #: Clarise Cruz [phone]  S Name/Age/Gender [name] 74 y.o. male Room/Bed: WA20/WA20  Code Status   Code Status: Prior  Home/SNF/Other Home Patient oriented to: self, place, time and situation Is this baseline? Yes   Triage Complete: Triage complete  Chief Complaint Hemoptysis [R04.2]  Triage Note Patient states he tripped over his dog and fell hitting a dresser on his left axillary area. Patient states he had x-rays completed. Patient states he has been having hemoptysis x several weeks ago. Patient states the bleeding has worsened in the past few days. Patient states he was diagnosed with lung cancer 3 days ago.    Allergies Allergies  Allergen Reactions  . Tetracycline Itching and Rash    Level of Care/Admitting Diagnosis ED Disposition    ED Disposition Condition [facility] Area: [place] [[id number]]  Level of Care: Med-Surg [16]  May admit patient to [facility] or [facility] if equivalent level of care is available:: No  Covid Evaluation: Asymptomatic Screening Protocol (No Symptoms)  Diagnosis: Hemoptysis [259563]  Admitting Physician: Eston Esters  Attending Physician: Eston Esters  Estimated length of stay: past midnight tomorrow  Certification:: I certify this patient will need inpatient services for at least 2 midnights       B Medical/Surgery History Past Medical History:  Diagnosis Date  . Allergic rhinitis due to pollen   . Cancer ([place])   . Chronic kidney disease, stage III (moderate) 12/19/2012  . Depression   . Diabetes mellitus ([place])   . Diverticulosis   . Erectile dysfunction associated with type 2 diabetes mellitus ([place]) 05/28/2015  . Former very heavy cigarette smoker (more than 40 per day) 10/07/2014  . GERD (gastroesophageal reflux disease)   . Gout   . Hyperlipidemia   . Hypertension   . Hypogonadism male 06/14/2012   . Iron deficiency   . Memory loss   . Osteoarthritis   . Personal history of colonic polyps    1996   Past Surgical History:  Procedure Laterality Date  . CARDIAC CATHETERIZATION  11/2011   [facility]  . CHOLECYSTECTOMY  1980  . ESOPHAGOGASTRODUODENOSCOPY (EGD) WITH PROPOFOL N/A 09/27/2017   Procedure: ESOPHAGOGASTRODUODENOSCOPY (EGD) WITH PROPOFOL;  Surgeon: Lin Landsman, MD;  Location: [facility];  Service: Gastroenterology;  Laterality: N/A;  . EYE SURGERY Right 07/29/2016   cataract - Dr. Manuella Ghazi  . EYE SURGERY Left 08/23/2106   cataract - Dr. Manuella Ghazi  . TOTAL HIP ARTHROPLASTY  2009   Dr. Gladstone Lighter  . TOTAL HIP ARTHROPLASTY       A IV Location/Drains/Wounds Patient Lines/Drains/Airways Status    Active Line/Drains/Airways    Name Placement date Placement time Site Days   Peripheral IV 10/12/19 Left Forearm 10/12/19  1729  Forearm  less than 1   Wound / Incision (Open or Dehisced) 04/07/19 Foot Left 04/07/19  0407  Foot  188          Intake/Output Last 24 hours No intake or output data in the 24 hours ending 10/12/19 2041  Labs/Imaging Results for orders placed or performed during the hospital encounter of 10/12/19 (from the past 48 hour(s))  CBC with Differential     Status: Abnormal   Collection Time: 10/12/19  5:27 PM  Result Value Ref Range   WBC 6.1 4.0 - 10.5 K/uL   RBC 4.53 4.22 - 5.81 MIL/uL   Hemoglobin 12.5 (  L) 13.0 - 17.0 g/dL   HCT 38.3 (L) 39 - 52 %   MCV 84.5 80.0 - 100.0 fL   MCH 27.6 26.0 - 34.0 pg   MCHC 32.6 30.0 - 36.0 g/dL   RDW 13.2 11.5 - 15.5 %   Platelets 254 150 - 400 K/uL   nRBC 0.0 0.0 - 0.2 %   Neutrophils Relative % 72 %   Neutro Abs 4.4 1.7 - 7.7 K/uL   Lymphocytes Relative 14 %   Lymphs Abs 0.9 0.7 - 4.0 K/uL   Monocytes Relative 12 %   Monocytes Absolute 0.7 0 - 1 K/uL   Eosinophils Relative 1 %   Eosinophils Absolute 0.1 0 - 0 K/uL   Basophils Relative 0 %   Basophils Absolute 0.0 0 - 0 K/uL   Immature Granulocytes 1 %    Abs Immature Granulocytes 0.03 0.00 - 0.07 K/uL    Comment: Performed at [facility], [place] [address], [place], [place] [address]  Basic metabolic panel     Status: Abnormal   Collection Time: 10/12/19  5:27 PM  Result Value Ref Range   Sodium 132 (L) 135 - 145 mmol/L   Potassium 4.0 3.5 - 5.1 mmol/L   Chloride 91 (L) 98 - 111 mmol/L   CO2 27 22 - 32 mmol/L   Glucose, Bld 424 (H) 70 - 99 mg/dL    Comment: Glucose reference range applies only to samples taken after fasting for at least 8 hours.   BUN 21 8 - 23 mg/dL   Creatinine, Ser 1.44 (H) 0.61 - 1.24 mg/dL   Calcium 8.7 (L) 8.9 - 10.3 mg/dL   GFR calc non Af Amer 48 (L) >60 mL/min   GFR calc Af Amer 55 (L) >60 mL/min   Anion gap 14 5 - 15    Comment: Performed at [facility], [place] [address], [place], [place] [address]  Troponin I (High Sensitivity)     Status: None   Collection Time: 10/12/19  5:27 PM  Result Value Ref Range   Troponin I (High Sensitivity) 12 <18 ng/L    Comment: (NOTE) Elevated high sensitivity troponin I (hsTnI) values and significant  changes across serial measurements may suggest ACS but many other  chronic and acute conditions are known to elevate hsTnI results.  Refer to the "Links" section for chest pain algorithms and additional  guidance. Performed at [facility], [place] [address] [place], [place] [address]   SARS Coronavirus 2 by RT PCR (hospital order, performed in [facility] hospital lab) Nasopharyngeal Nasopharyngeal Swab     Status: None   Collection Time: 10/12/19  5:30 PM   Specimen: Nasopharyngeal Swab  Result Value Ref Range   SARS Coronavirus 2 NEGATIVE NEGATIVE    Comment: (NOTE) SARS-CoV-2 target nucleic acids are NOT DETECTED.  The SARS-CoV-2 RNA is generally detectable in upper and lower respiratory specimens during the acute phase of infection. The lowest concentration of SARS-CoV-2 viral copies this assay can detect is  250 copies / mL. A negative result does not preclude SARS-CoV-2 infection and should not be used as the sole basis for treatment or other patient management decisions.  A negative result may occur with improper specimen collection / handling, submission of specimen other than nasopharyngeal swab, presence of viral mutation(s) within the areas targeted by this assay, and inadequate number of viral copies (<250 copies / mL). A negative result must be combined with clinical observations, patient history,  and epidemiological information.  Fact Sheet for Patients:   StrictlyIdeas.no  Fact Sheet for Healthcare Providers: BankingDealers.co.za  This test is not yet approved or  cleared by the [place] FDA and has been authorized for detection and/or diagnosis of SARS-CoV-2 by FDA under an Emergency Use Authorization (EUA).  This EUA will remain in effect (meaning this test can be used) for the duration of the COVID-19 declaration under Section 564(b)(1) of the Act, 21 U.S.C. section 360bbb-3(b)(1), unless the authorization is terminated or revoked sooner.  Performed at [facility], [place] [address], [place],  [address]    DG Chest 2 View  Result Date: 10/12/2019 CLINICAL DATA:  Hemoptysis, fall EXAM: CHEST - 2 VIEW COMPARISON:  Chest x-ray 10/10/2019, CT chest 10/11/2019 FINDINGS: No pleural effusion or pneumothorax. No interval consolidation. Similar right hilar mass and adjacent adenopathy. Normal heart size. No pneumothorax. Degenerative changes of the spine. Rounded metallic density superiorly on lateral view appears external to the patient on frontal view. IMPRESSION: Similar right hilar mass and adjacent adenopathy. No acute interval changes. Electronically Signed   By: Donavan Foil M.D.   On: 10/12/2019 17:48   CT Chest W Contrast  Result Date: 10/11/2019 CLINICAL DATA:  Abnormal chest x-ray with RIGHT hilar  mass EXAM: CT CHEST WITH CONTRAST TECHNIQUE: Multidetector CT imaging of the chest was performed during intravenous contrast administration. CONTRAST:  3mL OMNIPAQUE IOHEXOL 300 MG/ML  SOLN COMPARISON:  Chest radiograph 10/10/2018 FINDINGS: Cardiovascular: No significant vascular findings. Normal heart size. No pericardial effusion. Mediastinum/Nodes: No axillary supraclavicular adenopathy. The small benign appearing cyst in the LEFT lobe of thyroid gland measuring 10 mm. Bulky RIGHT paratracheal adenopathy. RIGHT lower paratracheal lymph node measures 32 mm short axis. Smaller high RIGHT paratracheal lymph nodes measuring 6 mm on image 23/2. Large subcarinal lymph node measuring 21 mm. Large hilar /perihilar mass posterior to the RIGHT hilum along the bronchus intermedius and RIGHT lower lobe bronchus measures 5.5 by 5.6 cm and demonstrates uniform enhancement. Smaller pleural base nodule measuring 2.5 cm posterior to the mass on image 53/2. Additional peripheral nodule in the RIGHT lower lobe along the pleural surface measures 13 mm on image 76/3. Lungs/Pleura: RIGHT lower lobe/RIGHT hilar mass described in the mediastinal section. Mass does span the oblique fissure into the RIGHT upper lobe seen on sagittal imaging Upper Abdomen: Limited view of the liver, kidneys, pancreas are unremarkable. Normal adrenal glands. Musculoskeletal: No aggressive osseous lesion IMPRESSION: 1. Large RIGHT lower lobe/RIGHT hilar mass most consistent with primary bronchogenic carcinoma (consider small cell lung cancer). Mass does across the RIGHT oblique fissure into the RIGHT upper lobe. Mass also narrows the RIGHT lower lobe bronchus. 2. Bulky RIGHT lower paratracheal and subcarinal adenopathy. Smaller metastatic nodes in the high RIGHT paratracheal nodal station. 3. No evidence of supraclavicular nodal metastasis. 4. Two peripheral nodules in the RIGHT lower lobe likely metastatic deposits or primary lesion. 5. If  multidisciplinary follow up management is desired, this is available in the [facility] through the [facility] [phone]. These results will be called to the ordering clinician or representative by the Radiologist Assistant, and communication documented in the PACS or [employer]. Electronically Signed   By: Suzy Bouchard M.D.   On: 10/11/2019 15:40    Pending Labs [employer] (From admission, onward) Comment          Start     Ordered   Signed and Held  Basic metabolic panel  Tomorrow morning,  R        Signed and Held   Signed and Held  Protime-INR  Tomorrow morning,   R        Signed and Held   Signed and Held  Type and screen [facility]  Once,   R       Comments: [place]    Signed and Held          Vitals/Pain Today's Vitals   10/12/19 1736 10/12/19 1845 10/12/19 1848 10/12/19 1900  BP: (!) 167/95 (!) 199/100 (!) 200/103 (!) 195/94  Pulse: (!) 103 (!) 107 (!) 105 (!) 102  Resp: (!) 24 19 18 16   Temp:   97.9 F (36.6 C)   TempSrc:   Oral   SpO2: 97% 98% 96% 97%  Weight:      Height:      PainSc:        Isolation Precautions No active isolations  Medications Medications  tranexamic acid (CYKLOKAPRON) 1000 MG/10ML nebulizer solution 500 mg (500 mg Nebulization Given 10/12/19 2004)    Mobility walks Low fall risk   Focused Assessments Pulmonary Assessment Handoff:  Lung sounds:   O2 Device: Room Air        R Recommendations: See Admitting Provider Note  Report given to:   Additional Notes:

## 2019-10-12 NOTE — Telephone Encounter (Signed)
See my result note  reasonable

## 2019-10-13 DIAGNOSIS — Z87891 Personal history of nicotine dependence: Secondary | ICD-10-CM

## 2019-10-13 DIAGNOSIS — E11621 Type 2 diabetes mellitus with foot ulcer: Secondary | ICD-10-CM

## 2019-10-13 DIAGNOSIS — L97521 Non-pressure chronic ulcer of other part of left foot limited to breakdown of skin: Secondary | ICD-10-CM

## 2019-10-13 DIAGNOSIS — R042 Hemoptysis: Secondary | ICD-10-CM

## 2019-10-13 DIAGNOSIS — F3341 Major depressive disorder, recurrent, in partial remission: Secondary | ICD-10-CM

## 2019-10-13 DIAGNOSIS — N1831 Chronic kidney disease, stage 3a: Secondary | ICD-10-CM

## 2019-10-13 DIAGNOSIS — I10 Essential (primary) hypertension: Secondary | ICD-10-CM

## 2019-10-13 LAB — TYPE AND SCREEN
ABO/RH(D): A POS
Antibody Screen: NEGATIVE

## 2019-10-13 LAB — ABO/RH: ABO/RH(D): A POS

## 2019-10-13 LAB — BASIC METABOLIC PANEL
Anion gap: 9 (ref 5–15)
BUN: 19 mg/dL (ref 8–23)
CO2: 29 mmol/L (ref 22–32)
Calcium: 8.7 mg/dL — ABNORMAL LOW (ref 8.9–10.3)
Chloride: 96 mmol/L — ABNORMAL LOW (ref 98–111)
Creatinine, Ser: 1.43 mg/dL — ABNORMAL HIGH (ref 0.61–1.24)
GFR calc Af Amer: 56 mL/min — ABNORMAL LOW (ref >60)
GFR calc non Af Amer: 48 mL/min — ABNORMAL LOW (ref >60)
Glucose, Bld: 327 mg/dL — ABNORMAL HIGH (ref 70–99)
Potassium: 3.8 mmol/L (ref 3.5–5.1)
Sodium: 134 mmol/L — ABNORMAL LOW (ref 135–145)

## 2019-10-13 LAB — GLUCOSE, CAPILLARY
Glucose-Capillary: 352 mg/dL — ABNORMAL HIGH (ref 70–99)
Glucose-Capillary: 324 mg/dL — ABNORMAL HIGH (ref 70–99)
Glucose-Capillary: 150 mg/dL — ABNORMAL HIGH (ref 70–99)
Glucose-Capillary: 138 mg/dL — ABNORMAL HIGH (ref 70–99)

## 2019-10-13 LAB — HEMOGLOBIN AND HEMATOCRIT, BLOOD
HCT: 31.9 % — ABNORMAL LOW (ref 39.0–52.0)
Hemoglobin: 10.3 g/dL — ABNORMAL LOW (ref 13.0–17.0)

## 2019-10-13 LAB — PROTIME-INR
INR: 1 (ref 0.8–1.2)
Prothrombin Time: 13 seconds (ref 11.4–15.2)

## 2019-10-13 MED ORDER — TESTOSTERONE CYPIONATE 200 MG/ML IM SOLN
150.0000 mg | Freq: Once | INTRAMUSCULAR | Status: AC
  Administered 2019-10-13: 150 mg via INTRAMUSCULAR
  Filled 2019-10-13: qty 0.75

## 2019-10-13 MED ORDER — HYDROCOD POLST-CPM POLST ER 10-8 MG/5ML PO SUER
5.0000 mL | Freq: Two times a day (BID) | ORAL | Status: DC
  Administered 2019-10-13 – 2019-10-14 (×2): 5 mL via ORAL
  Filled 2019-10-13 (×2): qty 5

## 2019-10-13 MED ORDER — INSULIN ASPART 100 UNIT/ML ~~LOC~~ SOLN
0.0000 [IU] | Freq: Three times a day (TID) | SUBCUTANEOUS | Status: DC
  Administered 2019-10-13 – 2019-10-14 (×3): 3 [IU] via SUBCUTANEOUS

## 2019-10-13 MED ORDER — CLONIDINE HCL 0.1 MG PO TABS
0.2000 mg | ORAL_TABLET | Freq: Once | ORAL | Status: AC
  Administered 2019-10-13: 0.2 mg via ORAL
  Filled 2019-10-13: qty 2

## 2019-10-13 MED ORDER — LOPERAMIDE HCL 2 MG PO CAPS
2.0000 mg | ORAL_CAPSULE | Freq: Four times a day (QID) | ORAL | Status: DC | PRN
  Administered 2019-10-13: 2 mg via ORAL
  Filled 2019-10-13 (×2): qty 1

## 2019-10-13 MED ORDER — GUAIFENESIN-DM 100-10 MG/5ML PO SYRP: 5.0000 mL | ORAL_SOLUTION | ORAL | Status: DC | PRN

## 2019-10-13 MED ORDER — NYSTATIN 100000 UNIT/ML MT SUSP
5.0000 mL | Freq: Four times a day (QID) | OROMUCOSAL | Status: DC
  Administered 2019-10-13 – 2019-10-14 (×4): 500000 [IU] via ORAL
  Filled 2019-10-13 (×4): qty 5

## 2019-10-13 MED ORDER — INSULIN GLARGINE 100 UNIT/ML ~~LOC~~ SOLN
10.0000 [IU] | Freq: Every day | SUBCUTANEOUS | Status: DC
  Administered 2019-10-13 – 2019-10-14 (×2): 10 [IU] via SUBCUTANEOUS
  Filled 2019-10-13 (×2): qty 0.1

## 2019-10-13 NOTE — Progress Notes (Signed)
Manual BP is 190/92 and patient is complaining of a headache, scheduled BP medication was administered earlier. NP Blount was notified.

## 2019-10-13 NOTE — Progress Notes (Addendum)
PROGRESS NOTE    [name]  WNI:[id number] DOB: 11-26-1945 DOA: 10/12/2019 PCP: [name], MD    Brief Narrative:  The patient was admitted to the hospital with a working diagnosis of hemoptysis in the setting of lung cancer.  74 year old male with past medical history for hypertension, chronic kidney disease stage IIIa, type 2 diabetes mellitus, obstructive sleep apnea and a chronic left foot ulcer.  As an outpatient he has been diagnosed with right lower lobe/hilar mass, likely bronchogenic carcinoma.  For the last 3 weeks patient has been experiencing cough with hemoptysis tea spoon, associated with dyspnea and weight loss. Over last 2 days amount of hemoptysis got worse and he noted more severe dyspnea.  On his initial physical examination his blood pressure was 167/95 199/100, 200/103, heart rate 107, respiratory rate 24, temperature 97.9 F, oxygen saturation 98%, his lungs have decreased breath sounds bilaterally, heart S1-S2, present rhythmic, abdomen soft, no lower extremity edema. Sodium 132, potassium 4.0, chloride 91, bicarb 27, glucose 424, BUN 21, creatinine 1.44, white count 6.1, hemoglobin 12.5, hematocrit 38.3, platelets 254.  SARS COVID-19 negative.  Chest radiograph with right hilar mass.   Assessment & Plan:   Principal Problem:   Lung cancer ([place]) Active Problems:   GOUT   Major depressive disorder, recurrent episode, in partial remission (HCC)   Essential hypertension   Diabetic neuropathy (HCC)   OSA (obstructive sleep apnea)   Hypogonadism male   Chronic kidney disease, stage III (moderate)   Former very heavy cigarette smoker (more than 40 per day)   Uncontrolled type 2 diabetes mellitus with peripheral neuropathy (HCC)   Diabetic foot ulcer (HCC)   Mild cognitive impairment   Hemoptysis   1. New onset hemoptysis in the setting of right hilar mass. High pre-test probability for malignancy. Plan for diagnostic bronchoscopy next week. His Hgb has  been trending down to 10.3 from 12,5.  Will continue oxymetry monitoring and objective quantification of hemoptysis, to rule out massive event. Continue oxymetry monitoring and will add scheduled antitussive agents. No clinical signs of infection will dc antibiotic therapy.  Follow with cell count.   If no significant bleeding today, will plan for dc home in am.   2. Uncontrolled T2DM/ dyslipidemia, . His fasting glucose this am is 327. Will continue insulin sliding scale for glucose cover and monitoring. Advance diet to heart healthy and diabetic prudent.   Will give basal insulin 10 units daily   Continue with statin therapy.   3. HTN. Will dc IV fluids and will continue blood pressure control with atenolol and lisinopril.   4. CKD stage 3a. Stable renal function with serum cr at 1.43 with K at 3,8 and serum bicarbonate at 29.   Will follow up on renal function in am, will discontinue IV fluids.   5. Oral thrush. Will add nystatin.   6. Dementia and depression. Continue with venlafaxine and memantine/ donepezil.   Status is: Inpatient  Remains inpatient appropriate because:Ongoing diagnostic testing needed not appropriate for outpatient work up   Dispo: The patient is from: Home              Anticipated d/c is to: Home              Anticipated d/c date is: 1 day              Patient currently is not medically stable to d/c.  DVT prophylaxis: scd   Code Status:   full  Family Communication:  No family at the bedside        Consultants:   Pulmonary  Procedures:     Antimicrobials:       Subjective: Patient continue to have persistent hemoptysis, not back to baseline, associated with dyspnea, no chest pain.   Objective: Vitals:   10/13/19 0233 10/13/19 0525 10/13/19 0603 10/13/19 0945  BP: (!) 148/80  100/63 111/63  Pulse: 88  75 73  Resp:   15 14  Temp:   97.6 F (36.4 C) 97.7 F (36.5 C)  TempSrc:   Oral   SpO2:  97% 100% 93%  Weight:      Height:         Intake/Output Summary (Last 24 hours) at 10/13/2019 1303 Last data filed at 10/13/2019 0600 Gross per 24 hour  Intake 151.9 ml  Output 300 ml  Net -148.1 ml   Filed Weights   10/12/19 1525  Weight: 90.7 kg    Examination:   General: Not in pain or dyspnea. Deconditioned  Neurology: Awake and alert, non focal  E ENT: mild pallor, no icterus, oral mucosa moist Cardiovascular: No JVD. S1-S2 present, rhythmic, no gallops, rubs, or murmurs. No lower extremity edema. Pulmonary: positive breath sounds bilaterally, adequate air movement, no wheezing, rhonchi or rales. Gastrointestinal. Abdomen with, no organomegaly, non tender, no rebound or guarding Skin. No rashes Musculoskeletal: no joint deformities     Data Reviewed: I have personally reviewed following labs and imaging studies  CBC: Recent Labs  Lab 10/10/19 1221 10/12/19 1727  WBC 11.1* 6.1  NEUTROABS 8.7* 4.4  HGB 12.4* 12.5*  HCT 37.5* 38.3*  MCV 84.5 84.5  PLT 230.0 332   Basic Metabolic Panel: Recent Labs  Lab 10/10/19 1221 10/12/19 1727 10/13/19 0435  NA 131* 132* 134*  K 4.8 4.0 3.8  CL 93* 91* 96*  CO2 27 27 29   GLUCOSE 354* 424* 327*  BUN 16 21 19   CREATININE 1.52* 1.44* 1.43*  CALCIUM 9.5 8.7* 8.7*   GFR: Estimated Creatinine Clearance: 49.8 mL/min (A) (by C-G formula based on SCr of 1.43 mg/dL (H)). Liver Function Tests: Recent Labs  Lab 10/10/19 1221  AST 12  ALT 11  ALKPHOS 89  BILITOT 0.7  PROT 7.6  ALBUMIN 3.8   No results for input(s): LIPASE, AMYLASE in the last 168 hours. No results for input(s): AMMONIA in the last 168 hours. Coagulation Profile: Recent Labs  Lab 10/13/19 0435  INR 1.0   Cardiac Enzymes: No results for input(s): CKTOTAL, CKMB, CKMBINDEX, TROPONINI in the last 168 hours. BNP (last 3 results) No results for input(s): PROBNP in the last 8760 hours. HbA1C: No results for input(s): HGBA1C in the last 72 hours. CBG: Recent Labs  Lab 10/13/19 0717  10/13/19 1127  GLUCAP 352* 324*   Lipid Profile: No results for input(s): CHOL, HDL, LDLCALC, TRIG, CHOLHDL, LDLDIRECT in the last 72 hours. Thyroid Function Tests: No results for input(s): TSH, T4TOTAL, FREET4, T3FREE, THYROIDAB in the last 72 hours. Anemia Panel: No results for input(s): VITAMINB12, FOLATE, FERRITIN, TIBC, IRON, RETICCTPCT in the last 72 hours.    Radiology Studies: I have reviewed all of the imaging during this hospital visit personally     Scheduled Meds: . ampicillin  500 mg Oral Daily  . atenolol  50 mg Oral Daily  . donepezil  10 mg Oral QHS  . doxazosin  4 mg Oral Daily  . insulin aspart  0-20 Units Subcutaneous TID WC  . insulin aspart  0-5  Units Subcutaneous QHS  . insulin regular human CONCENTRATED  20 Units Subcutaneous TID WC  . lisinopril  10 mg Oral Daily  . memantine  10 mg Oral BID  . montelukast  10 mg Oral QHS  . nystatin  5 mL Oral QID  . pantoprazole  40 mg Oral Daily  . silver sulfADIAZINE   Topical Daily  . simvastatin  40 mg Oral Daily  . testosterone cypionate  150 mg Intramuscular Once  . venlafaxine XR  150 mg Oral QHS  . venlafaxine XR  75 mg Oral q morning - 10a   Continuous Infusions: . sodium chloride 125 mL/hr at 10/13/19 0556     LOS: 1 day        Lijah Bourque Gerome Apley, MD

## 2019-10-13 NOTE — Consult Note (Signed)
NAME:  [name], [name]:  [id number], DOB:  Sep 12, 1945, LOS: 1 ADMISSION DATE:  10/12/2019, CONSULTATION DATE: October 13, 2019 REFERRING MD: Dr. Cathlean Sauer, CHIEF COMPLAINT: Hemoptysis  Brief History   74 year old male admitted on July 2 in the setting of hemoptysis and a new lung mass with mediastinal lymphadenopathy.  History of present illness   This is a pleasant 74 year old male who smoked 2 packs of cigarettes daily for 30 years, quit 20 years ago who presented to our facility on recommendation from his primary care doctor after lung imaging recently showed a large lung mass.  The patient says that for the last 3 weeks he has been coughing up a teaspoon amount of blood at least once a day or sometimes more frequently.  He denies shortness of breath associated with this.  He says that he is lost 69 pounds over the last 2 years since his wife died which he attributed to his depression.  He said he fell a few days ago and landed on his left side and had some pain on that side, this led to a chest x-ray which showed a new large right-sided hilar mass.  He had a CT scan performed which showed a right upper lobe and right lower lobe mass in addition to significant mediastinal adenopathy.  He says that he still coughing up blood here in the hospital but it has slowed down a bit since starting inhaled TXA.  He says he has had a little bit of shortness of breath but it is not too bad.  He did complain of headache earlier this morning.  He has a PET scan arranged for July 6.  Pulmonary and critical care medicine was consulted for evaluation of the lung mass and consideration of bronchoscopy.  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  He also drove a truck for a number of years as well.  He has never been told that he has a malignancy.  Past Medical History   Past Medical History:  Diagnosis Date  . Allergic rhinitis due to  pollen   . Cancer ([place])   . Chronic kidney disease, stage III (moderate) 12/19/2012  . Depression   . Diabetes mellitus ([place])   . Diverticulosis   . Erectile dysfunction associated with type 2 diabetes mellitus ([place]) 05/28/2015  . Former very heavy cigarette smoker (more than 40 per day) 10/07/2014  . GERD (gastroesophageal reflux disease)   . Gout   . Hyperlipidemia   . Hypertension   . Hypogonadism male 06/14/2012  . Iron deficiency   . Memory loss   . Osteoarthritis   . Personal history of colonic polyps    1996    Significant Hospital Events     Consults:  Pulmonary  Procedures:  none  Significant Diagnostic Tests:  7/1 CT chest > images personally reviewed> large right sided lung mass in both right upper lobe and right lower lobe, partially collapsing the RUL airways, large subcarinal lung lymph node  Micro Data:  7/3 SARS COV 2> negative  Antimicrobials:    Interim history/subjective:  As above  Objective   Blood pressure 111/63, pulse 73, temperature 97.7 F (36.5 C), resp. rate 14, height 5' 11.5" (1.816 m), weight 90.7 kg, SpO2 93 %.        Intake/Output Summary (Last 24 hours) at 10/13/2019 1201 Last data filed at 10/13/2019 0600 Gross per 24 hour  Intake 151.9 ml  Output 300 ml  Net -148.1 ml   Filed Weights   10/12/19 1525  Weight: 90.7 kg    Examination:  General:  Resting comfortably in bed HENT: NCAT OP clear PULM: Diminished R base, some crackles RUL, clear on left, normal effort CV: RRR, no mgr GI: BS+, soft, nontender MSK: normal bulk and tone Neuro: awake, alert, no distress, MAEW   Resolved Hospital Problem list     Assessment & Plan:  Former smoker with large right-sided lung mass and mediastinal adenopathy: Picture consistent with malignancy Needs bronchoscopy, EBUS for needle aspiration Has PET scan planned for July 6 in the morning We will try to make arrangements for an outpatient bronchoscopy on July 6 or July 7, will  discuss schedule with endoscopy  Hemoptysis, mild Monitor off inhaled tranexamic acid nebulized   Best practice:     Labs   CBC: Recent Labs  Lab 10/10/19 1221 10/12/19 1727  WBC 11.1* 6.1  NEUTROABS 8.7* 4.4  HGB 12.4* 12.5*  HCT 37.5* 38.3*  MCV 84.5 84.5  PLT 230.0 563    Basic Metabolic Panel: Recent Labs  Lab 10/10/19 1221 10/12/19 1727 10/13/19 0435  NA 131* 132* 134*  K 4.8 4.0 3.8  CL 93* 91* 96*  CO2 27 27 29   GLUCOSE 354* 424* 327*  BUN 16 21 19   CREATININE 1.52* 1.44* 1.43*  CALCIUM 9.5 8.7* 8.7*   GFR: Estimated Creatinine Clearance: 49.8 mL/min (A) (by C-G formula based on SCr of 1.43 mg/dL (H)). Recent Labs  Lab 10/10/19 1221 10/12/19 1727  WBC 11.1* 6.1    Liver Function Tests: Recent Labs  Lab 10/10/19 1221  AST 12  ALT 11  ALKPHOS 89  BILITOT 0.7  PROT 7.6  ALBUMIN 3.8   No results for input(s): LIPASE, AMYLASE in the last 168 hours. No results for input(s): AMMONIA in the last 168 hours.  ABG    Component Value Date/Time   HCO3 16.7 (L) 08/04/2018 1448   TCO2 18 (L) 08/04/2018 1448   ACIDBASEDEF 11.0 (H) 08/04/2018 1448   O2SAT 63.0 08/04/2018 1448     Coagulation Profile: Recent Labs  Lab 10/13/19 0435  INR 1.0    Cardiac Enzymes: No results for input(s): CKTOTAL, CKMB, CKMBINDEX, TROPONINI in the last 168 hours.  HbA1C: Hemoglobin A1C  Date/Time Value Ref Range Status  09/17/2019 01:33 PM 13.0 (A) 4.0 - 5.6 % Final   Hgb A1c MFr Bld  Date/Time Value Ref Range Status  10/10/2019 12:21 PM 10.6 (H) 4.6 - 6.5 % Final    Comment:    Glycemic Control Guidelines for People with Diabetes:Non Diabetic:  <6%Goal of Therapy: <7%Additional Action Suggested:  >8%   04/07/2019 04:32 AM 12.0 (H) 4.8 - 5.6 % Final    Comment:    (NOTE) Pre diabetes:          5.7%-6.4% Diabetes:              >6.4% Glycemic control for   <7.0% adults with diabetes     CBG: Recent Labs  Lab 10/13/19 0717 10/13/19 1127  GLUCAP  352* 324*    Review of Systems:   Gen: Denies fever, chills, weight change, fatigue, night sweats HEENT: Denies blurred vision, double vision, hearing loss, tinnitus, sinus congestion, rhinorrhea, sore throat, neck stiffness, dysphagia PULM: per HPI CV: Denies chest pain, edema, orthopnea, paroxysmal nocturnal dyspnea, palpitations GI: Denies abdominal pain, nausea, vomiting, diarrhea, hematochezia, melena, constipation, change in bowel habits GU:  Denies dysuria, hematuria, polyuria, oliguria, urethral discharge Endocrine: Denies hot or cold intolerance, polyuria, polyphagia or appetite change Derm: Denies rash, dry skin, scaling or peeling skin change Heme: Denies easy bruising, bleeding, bleeding gums Neuro: Denies headache, numbness, weakness, slurred speech, loss of memory or consciousness   Past Medical History  He,  has a past medical history of Allergic rhinitis due to pollen, Cancer ([place]), Chronic kidney disease, stage III (moderate) (12/19/2012), Depression, Diabetes mellitus ([place]), Diverticulosis, Erectile dysfunction associated with type 2 diabetes mellitus ([place]) (05/28/2015), Former very heavy cigarette smoker (more than 40 per day) (10/07/2014), GERD (gastroesophageal reflux disease), Gout, Hyperlipidemia, Hypertension, Hypogonadism male (06/14/2012), Iron deficiency, Memory loss, Osteoarthritis, and Personal history of colonic polyps.   Surgical History    Past Surgical History:  Procedure Laterality Date  . CARDIAC CATHETERIZATION  11/2011   [facility]  . CHOLECYSTECTOMY  1980  . ESOPHAGOGASTRODUODENOSCOPY (EGD) WITH PROPOFOL N/A 09/27/2017   Procedure: ESOPHAGOGASTRODUODENOSCOPY (EGD) WITH PROPOFOL;  Surgeon: Lin Landsman, MD;  Location: [facility];  Service: Gastroenterology;  Laterality: N/A;  . EYE SURGERY Right 07/29/2016   cataract - Dr. Manuella Ghazi  . EYE SURGERY Left 08/23/2106   cataract - Dr. Manuella Ghazi  . TOTAL HIP ARTHROPLASTY  2009   Dr. Gladstone Lighter  . TOTAL HIP ARTHROPLASTY        Social History   reports that he quit smoking about 19 years ago. His smoking use included cigarettes. He has a 105.00 pack-year smoking history. He has never used smokeless tobacco. He reports that he does not drink alcohol and does not use drugs.   Family History   His family history includes Alzheimer's disease in his mother; COPD in his sister; Diabetes in his father and mother; Heart attack in his father and sister; Heart disease in his maternal grandfather and sister; Lung cancer in his father and sister; Stroke in his father.   Allergies Allergies  Allergen Reactions  . Tetracycline Itching and Rash     Home Medications  Prior to Admission medications   Medication Sig Start Date End Date Taking? Authorizing Provider  acetaminophen (QC ACETAMINOPHEN 8HR ARTH PAIN) 650 MG CR tablet Take 650 mg by mouth every 8 (eight) hours as needed for pain.   Yes [provider]  ampicillin (PRINCIPEN) 500 MG capsule Take 500 mg by mouth daily.    Yes [provider]  Ascorbic Acid (VITAMIN C) 1000 MG tablet Take 1,000 mg by mouth daily.   Yes [provider]  aspirin EC 81 MG tablet Take 81 mg by mouth at bedtime.   Yes [provider]  atenolol (TENORMIN) 50 MG tablet TAKE 1 TABLET BY MOUTH EVERY DAY Patient taking differently: Take 50 mg by mouth daily.  08/09/19  Yes Copland, Frederico Hamman, MD  cholecalciferol (VITAMIN D) 1000 UNITS tablet Take 1,000 Units by mouth daily.     Yes [provider]  doxazosin (CARDURA) 8 MG tablet TAKE ONE-HALF TABLET AT BEDTIME Patient taking differently: Take 4 mg by mouth daily.  01/26/19  Yes Copland, Frederico Hamman, MD  fluticasone (FLONASE) 50 MCG/ACT nasal spray PLACE 2 SPRAYS IN EACH NOSTRIL DAILY Patient taking differently: Place 2 sprays into both nostrils daily as needed for allergies or rhinitis.  01/26/19  Yes Copland, Frederico Hamman, MD  Hyprom-Naphaz-Polysorb-Zn Sulf (CLEAR EYES COMPLETE OP) Place 1 drop into both  eyes daily as needed (dry eyes).   Yes [provider]  insulin regular human CONCENTRATED (HUMULIN R U-500 KWIKPEN) 500 UNIT/ML kwikpen Inject under skin  80-90 units before b'fast, 60 units before lunch, 80-90 units vefore dinner Patient taking differently: Inject 20-40 Units into the skin See admin instructions. Takes 35-40 units before breakfast, 20 units at lunch and 35-40 units at night 09/17/19  Yes Philemon Kingdom, MD  lisinopril (ZESTRIL) 10 MG tablet TAKE 1 TABLET BY MOUTH EVERY DAY Patient taking differently: Take 10 mg by mouth daily.  01/26/19  Yes Copland, Frederico Hamman, MD  memantine (NAMENDA) 10 MG tablet TAKE 1 TABLET BY MOUTH TWICE A DAY Patient taking differently: Take 10 mg by mouth 2 (two) times daily.  09/20/19  Yes Copland, Frederico Hamman, MD  montelukast (SINGULAIR) 10 MG tablet Take 1 tablet (10 mg total) by mouth at bedtime. 09/20/19  Yes Bedsole, Amy E, MD  omeprazole (PRILOSEC) 40 MG capsule Take 1 capsule (40 mg total) by mouth 2 (two) times daily before a meal. Patient taking differently: Take 40 mg by mouth daily.  08/21/18 04/06/28 Yes Copland, Frederico Hamman, MD  simvastatin (ZOCOR) 40 MG tablet TAKE 1 TABLET BY MOUTH AT BEDTIME Patient taking differently: Take 40 mg by mouth daily.  04/30/19  Yes Copland, Frederico Hamman, MD  Testosterone Cypionate 150 MG/ML SOLN Inject 1 mL into the muscle every 14 (fourteen) days. 09/07/19  Yes Copland, Frederico Hamman, MD  venlafaxine XR (EFFEXOR-XR) 75 MG 24 hr capsule TAKE 1 CAPSULE BY MOUTH EVERY MORNING AND 2 CAPSULES AT BEDTIME Patient taking differently: Take 75 mg by mouth See admin instructions. Takes 1 capsule by mouth in the morning and 2 capsules at night. 01/27/19  Yes Copland, Frederico Hamman, MD  colchicine 0.6 MG tablet Take 1 tablet (0.6 mg total) by mouth 2 (two) times daily. Patient not taking: Reported on 10/12/2019 07/12/18   Copland, Frederico Hamman, MD  donepezil (ARICEPT) 10 MG tablet TAKE 1 TABLET BY MOUTH AT BEDTIME Patient taking differently: Take 10 mg  by mouth at bedtime.  08/24/19   Copland, Frederico Hamman, MD  feeding supplement, GLUCERNA SHAKE, (GLUCERNA SHAKE) LIQD Take 237 mLs by mouth 3 (three) times daily between meals. Patient not taking: Reported on 10/12/2019 04/08/19   Lavina Hamman, MD  indomethacin (INDOCIN) 50 MG capsule Take 1 capsule (50 mg total) by mouth 2 (two) times daily with a meal. Patient not taking: Reported on 10/12/2019 07/12/18   Copland, Frederico Hamman, MD  Insulin Pen Needle (PEN NEEDLES) 31G X 5 MM MISC 1 each by Does not apply route 3 (three) times daily. To inject insulin E11.42 09/20/19   Philemon Kingdom, MD  nutrition supplement, JUVEN, (JUVEN) PACK Take 1 packet by mouth 2 (two) times daily between meals. Patient not taking: Reported on 10/12/2019 04/08/19   Lavina Hamman, MD  ondansetron (ZOFRAN) 4 MG tablet Take 1 tablet (4 mg total) by mouth every 8 (eight) hours as needed for nausea or vomiting. Patient not taking: Reported on 10/12/2019 08/21/18   Copland, Frederico Hamman, MD  ondansetron (ZOFRAN-ODT) 8 MG disintegrating tablet Take 1 tablet (8 mg total) by mouth 2 (two) times daily. Patient not taking: Reported on 10/12/2019 04/18/19   Copland, Frederico Hamman, MD  OZEMPIC, 0.25 OR 0.5 MG/DOSE, 2 MG/1.5ML SOPN INJECT 0.5 MG INTO THE SKIN ONCE A WEEK. Patient not taking: Reported on 09/26/2019 08/17/19   Philemon Kingdom, MD  sildenafil (REVATIO) 20 MG tablet Generic Revatio / Sildanefil 20 mg. 2 - 5 tabs 30 mins prior to intercourse. Patient not taking: Reported on 10/12/2019 04/13/18   [name], MD     Critical care time: n/a     Roselie Awkward, MD  [facility] Pager: [phone] Cell: [phone] If no response, call [phone]

## 2019-10-14 ENCOUNTER — Other Ambulatory Visit: Payer: Self-pay | Admitting: Family Medicine

## 2019-10-14 DIAGNOSIS — E0842 Diabetes mellitus due to underlying condition with diabetic polyneuropathy: Secondary | ICD-10-CM

## 2019-10-14 LAB — CBC WITH DIFFERENTIAL/PLATELET
Abs Immature Granulocytes: 0.02 10*3/uL (ref 0.00–0.07)
Basophils Absolute: 0 10*3/uL (ref 0.0–0.1)
Basophils Relative: 0 %
Eosinophils Absolute: 0.1 10*3/uL (ref 0.0–0.5)
Eosinophils Relative: 1 %
HCT: 29.6 % — ABNORMAL LOW (ref 39.0–52.0)
Hemoglobin: 9.6 g/dL — ABNORMAL LOW (ref 13.0–17.0)
Immature Granulocytes: 0 %
Lymphocytes Relative: 18 %
Lymphs Abs: 1.4 10*3/uL (ref 0.7–4.0)
MCH: 27.7 pg (ref 26.0–34.0)
MCHC: 32.4 g/dL (ref 30.0–36.0)
MCV: 85.3 fL (ref 80.0–100.0)
Monocytes Absolute: 1 10*3/uL (ref 0.1–1.0)
Monocytes Relative: 12 %
Neutro Abs: 5.3 10*3/uL (ref 1.7–7.7)
Neutrophils Relative %: 69 %
Platelets: 200 10*3/uL (ref 150–400)
RBC: 3.47 MIL/uL — ABNORMAL LOW (ref 4.22–5.81)
RDW: 13 % (ref 11.5–15.5)
WBC: 7.8 10*3/uL (ref 4.0–10.5)
nRBC: 0 % (ref 0.0–0.2)

## 2019-10-14 LAB — BASIC METABOLIC PANEL
Anion gap: 9 (ref 5–15)
BUN: 28 mg/dL — ABNORMAL HIGH (ref 8–23)
CO2: 25 mmol/L (ref 22–32)
Calcium: 8.2 mg/dL — ABNORMAL LOW (ref 8.9–10.3)
Chloride: 99 mmol/L (ref 98–111)
Creatinine, Ser: 2.08 mg/dL — ABNORMAL HIGH (ref 0.61–1.24)
GFR calc Af Amer: 36 mL/min — ABNORMAL LOW (ref >60)
GFR calc non Af Amer: 31 mL/min — ABNORMAL LOW (ref >60)
Glucose, Bld: 208 mg/dL — ABNORMAL HIGH (ref 70–99)
Potassium: 3.7 mmol/L (ref 3.5–5.1)
Sodium: 133 mmol/L — ABNORMAL LOW (ref 135–145)

## 2019-10-14 LAB — GLUCOSE, CAPILLARY: Glucose-Capillary: 198 mg/dL — ABNORMAL HIGH (ref 70–99)

## 2019-10-14 MED ORDER — GUAIFENESIN-DM 100-10 MG/5ML PO SYRP: 5.0000 mL | ORAL_SOLUTION | Freq: Four times a day (QID) | ORAL | 0 refills | Status: DC | PRN

## 2019-10-14 MED ORDER — NYSTATIN 100000 UNIT/ML MT SUSP: 5.0000 mL | Freq: Four times a day (QID) | OROMUCOSAL | 0 refills | Status: AC

## 2019-10-14 NOTE — Progress Notes (Signed)
[facility]  S: feels better, no hemoptysis, no dyspnea, slight pain in left chest again  O:  Vitals:   10/13/19 0603 10/13/19 0945 10/13/19 1327 10/14/19 0547  BP: 100/63 111/63 (!) 104/56 (!) 106/57  Pulse: 75 73 66 77  Resp: 15 14 14 17   Temp: 97.6 F (36.4 C) 97.7 F (36.5 C) 97.6 F (36.4 C) 98.4 F (36.9 C)  TempSrc: Oral   Oral  SpO2: 100% 93% 96% 97%  Weight:      Height:       RA  General:  Resting comfortably in bed HENT: NCAT OP clear PULM: Diminished R base, clear on left, normal effort CV: RRR, no mgr GI: BS+, soft, nontender MSK: normal bulk and tone Neuro: awake, alert, no distress, MAEW  CBC    Component Value Date/Time   WBC 7.8 10/14/2019 0514   RBC 3.47 (L) 10/14/2019 0514   HGB 9.6 (L) 10/14/2019 0514   HGB 12.8 (L) 11/16/2011 0513   HCT 29.6 (L) 10/14/2019 0514   HCT 36.1 (L) 11/16/2011 0513   PLT 200 10/14/2019 0514   PLT 205 11/16/2011 0513   MCV 85.3 10/14/2019 0514   MCV 90 11/16/2011 0513   MCH 27.7 10/14/2019 0514   MCHC 32.4 10/14/2019 0514   RDW 13.0 10/14/2019 0514   RDW 13.1 11/16/2011 0513   LYMPHSABS 1.4 10/14/2019 0514   LYMPHSABS 1.8 11/16/2011 0513   MONOABS 1.0 10/14/2019 0514   MONOABS 0.5 11/16/2011 0513   EOSABS 0.1 10/14/2019 0514   EOSABS 0.1 11/16/2011 0513   BASOSABS 0.0 10/14/2019 0514   BASOSABS 0.0 11/16/2011 0513   Impression/plan: Right lower lobe and hilar mass with mediastinal and hilar adenoapthy in a smoker: most likely lung cancer.  Hemoptysis stable so best approach is to discharge home, have PET on Tuesday, then return on Wednesday at noon to [facility] endoscopy for an EBUS with me.  Explained plan to patient, he voiced understanding.    Hemoptysis: resolved, mild; continue supportive care, needs definitive treatment for cancer  Roselie Awkward, MD [place] Pager: [phone] Cell: [phone] If no response, call [phone]

## 2019-10-14 NOTE — Discharge Instructions (Signed)
Hemoptysis  Hemoptysis is when you cough up blood. It can be mild or serious. If it is mild, you may cough up bloody spit and mucus (sputum). If you cough up 1-2 cups (240-480 mL) of blood within 24 hours (massive hemoptysis), it is an emergency. If you cough up blood, it is important to go and see your doctor. Follow these instructions at home:  Watch your condition for any changes.  Take over-the-counter and prescription medicines only as told by your doctor.  If you were prescribed an antibiotic medicine, take it as told by your doctor. Do not stop taking the antibiotic even if you start to feel better.  Go back to your normal activities as told by your doctor. Ask your doctor what activities are safe for you to do.  Do not use any products that contain nicotine or tobacco. These include cigarettes and e-cigarettes. If you need help quitting, ask your doctor.  Keep all follow-up visits as told by your doctor. This is important. Contact a doctor if:  You have a fever.  You cough up bloody spit and mucus. Get help right away if:  You cough up fresh blood or blood clots.  You have trouble breathing.  You have chest pain. This information is not intended to replace advice given to you by your health care provider. Make sure you discuss any questions you have with your health care provider. Document Revised: 03/11/2017 Document Reviewed: 12/26/2015 Elsevier Patient Education  [place]  A lung mass is a growth in the lung that is larger than 3 centimeters (1.2 inches). Smaller growths are called nodules. Most lung nodules are not cancer. Lung masses have a higher risk of being cancer. A lung mass is sometimes found during a routine chest X-ray or while doing other imaging tests to check for other problems. What are common types of lung masses? Lung masses include:  Tumors. These may be cancerous (malignant) or noncancerous (benign).  Infectious masses  (granulomas). These are masses caused by inflammation from bacterial infections, like tuberculosis, or fungal infections.  Noninfectious masses. Some diseases that cause lung inflammation may also cause lung masses to form.  Blood vessel malformations. What type of testing may be needed? Your health care provider may recommend that you have tests to diagnose the cause of your lung mass. The following tests may be done if a lung mass is found:  Physical exam.  Imaging tests, such as: ? Chest X-rays. ? CT scan. ? PET scan. This scan measures how much energy a mass is using.  Biopsy to rule out cancer or confirm a diagnosis. This procedure involves removing a tissue sample from the mass with a needle inserted through the chest, using a scope placed down into the lung, or through open surgery. Tests and physical exams may be done once, or they may be done regularly for a period of time. Tests and exams that are done regularly will help monitor whether the mass or tissue change is growing and becoming a concern. What are common treatments? Treatment for a lung mass depends on the cause. Noncancerous masses may require treatment specific to the cause. Treatment options for a cancerous lung mass may include:  Surgical removal.  Radiation therapy.  Chemotherapy. Follow these instructions at home: If you have had surgery or a biopsy, your health care provider will give you specific instructions for taking care of yourself at home after your procedure. Follow these instructions carefully. General home  care instructions include:  Take over-the-counter and prescription medicines only as told by your health care provider.  Return to your normal activities as told by your health care provider. Ask your health care provider what activities are safe for you.  Do not use any products that contain nicotine or tobacco, such as cigarettes, e-cigarettes, and chewing tobacco. If you need help quitting, ask  your health care provider.  Keep all follow-up visits as told by your health care provider. This is important. Contact a health care provider if you:  Have pain in your chest, back, or shoulder.  Are short of breath.  Have a cough.  Cough up blood or bloody sputum. Summary  A lung mass is a growth in the lung that is larger than 3 centimeters (1.2 inches).  Lung masses have a higher risk of being cancer than do smaller growths.  Sometimes a lung mass is found during a routine chest X-ray or other imaging test.  Your health care provider may do a biopsy of the lung mass to rule out or confirm cancer.  Treatment for this condition depends on the cause. This information is not intended to replace advice given to you by your health care provider. Make sure you discuss any questions you have with your health care provider. Document Revised: 07/21/2018 Document Reviewed: 11/22/2017 Elsevier Patient Education  [place].

## 2019-10-14 NOTE — Progress Notes (Signed)
Patient stable and following up outpatient soon. Leaving with his daughter today.

## 2019-10-14 NOTE — Plan of Care (Signed)
  Problem: Nutrition Goal: Patient maintains adequate hydration 10/14/2019 1627 by Lyndal Pulley, RN Outcome: Adequate for Discharge 10/14/2019 1627 by Lyndal Pulley, RN Outcome: Progressing Goal: Patient maintains weight 10/14/2019 1627 by Lyndal Pulley, RN Outcome: Adequate for Discharge 10/14/2019 1627 by Lyndal Pulley, RN Outcome: Progressing Goal: Patient/Family demonstrates understanding of diet 10/14/2019 1627 by Lyndal Pulley, RN Outcome: Adequate for Discharge 10/14/2019 1627 by Lyndal Pulley, RN Outcome: Progressing Goal: Patient/Family independently completes tube feeding 10/14/2019 1627 by Lyndal Pulley, RN Outcome: Adequate for Discharge 10/14/2019 1627 by Lyndal Pulley, RN Outcome: Progressing Goal: Patient will have no more than 5 lb weight change during LOS 10/14/2019 1627 by Lyndal Pulley, RN Outcome: Adequate for Discharge 10/14/2019 1627 by Lyndal Pulley, RN Outcome: Progressing Goal: Patient will utilize adaptive techniques to administer nutrition 10/14/2019 1627 by Lyndal Pulley, RN Outcome: Adequate for Discharge 10/14/2019 1627 by Lyndal Pulley, RN Outcome: Progressing Goal: Patient will verbalize dietary restrictions 10/14/2019 1627 by Lyndal Pulley, RN Outcome: Adequate for Discharge 10/14/2019 1627 by Lyndal Pulley, RN Outcome: Progressing

## 2019-10-14 NOTE — Telephone Encounter (Signed)
Given recent findings from scans this week, likely could be coming from his lung cancer.  He is currently admitted at [facility].

## 2019-10-14 NOTE — Plan of Care (Signed)
  Problem: Nutrition Goal: Patient maintains adequate hydration Outcome: Progressing Goal: Patient maintains weight Outcome: Progressing Goal: Patient/Family demonstrates understanding of diet Outcome: Progressing Goal: Patient/Family independently completes tube feeding Outcome: Progressing Goal: Patient will have no more than 5 lb weight change during LOS Outcome: Progressing Goal: Patient will utilize adaptive techniques to administer nutrition Outcome: Progressing Goal: Patient will verbalize dietary restrictions Outcome: Progressing   

## 2019-10-14 NOTE — Plan of Care (Signed)
  Problem: Nutrition Goal: Patient maintains adequate hydration 10/14/2019 1627 by Lyndal Pulley, RN Outcome: Adequate for Discharge 10/14/2019 1627 by Lyndal Pulley, RN Outcome: Adequate for Discharge 10/14/2019 1627 by Lyndal Pulley, RN Outcome: Progressing Goal: Patient maintains weight 10/14/2019 1627 by Lyndal Pulley, RN Outcome: Adequate for Discharge 10/14/2019 1627 by Lyndal Pulley, RN Outcome: Adequate for Discharge 10/14/2019 1627 by Lyndal Pulley, RN Outcome: Progressing Goal: Patient/Family demonstrates understanding of diet 10/14/2019 1627 by Lyndal Pulley, RN Outcome: Adequate for Discharge 10/14/2019 1627 by Lyndal Pulley, RN Outcome: Adequate for Discharge 10/14/2019 1627 by Lyndal Pulley, RN Outcome: Progressing Goal: Patient/Family independently completes tube feeding 10/14/2019 1627 by Lyndal Pulley, RN Outcome: Adequate for Discharge 10/14/2019 1627 by Lyndal Pulley, RN Outcome: Adequate for Discharge 10/14/2019 1627 by Lyndal Pulley, RN Outcome: Progressing Goal: Patient will have no more than 5 lb weight change during LOS 10/14/2019 1627 by Lyndal Pulley, RN Outcome: Adequate for Discharge 10/14/2019 1627 by Lyndal Pulley, RN Outcome: Adequate for Discharge 10/14/2019 1627 by Lyndal Pulley, RN Outcome: Progressing Goal: Patient will utilize adaptive techniques to administer nutrition 10/14/2019 1627 by Lyndal Pulley, RN Outcome: Adequate for Discharge 10/14/2019 1627 by Lyndal Pulley, RN Outcome: Adequate for Discharge 10/14/2019 1627 by Lyndal Pulley, RN Outcome: Progressing Goal: Patient will verbalize dietary restrictions 10/14/2019 1627 by Lyndal Pulley, RN Outcome: Adequate for Discharge 10/14/2019 1627 by Lyndal Pulley, RN Outcome: Adequate for Discharge 10/14/2019 1627 by Lyndal Pulley, RN Outcome: Progressing

## 2019-10-14 NOTE — Plan of Care (Signed)

## 2019-10-14 NOTE — Discharge Summary (Addendum)
Physician Discharge Summary  [name] NIO:[id number] DOB: [birth year] DOA: 10/12/2019  PCP: [name], MD  Admit date: 10/12/2019 Discharge date: 10/14/2019  Admitted From: Home  Disposition:  Home   Recommendations for Outpatient Follow-up and new medication changes:  1. Follow up with Dr. Lorelei Pont in 7 more days.  2. Follow up next week with Pulmonary for outpatient bronchoscopy.  3. Patient has been placed on antitussive agents.   Home Health: no   Equipment/Devices: no    Discharge Condition: stable  CODE STATUS: full  Diet recommendation: heart healthy and diabetic prudent.   Brief/Interim Summary: The patient was admitted to the hospital with a working diagnosis of hemoptysis in the setting of lung cancer.  74 year old male with past medical history for hypertension, chronic kidney disease stage IIIa, type 2 diabetes mellitus, obstructive sleep apnea and a chronic left foot ulcer.  As an outpatient he has been diagnosed with right lower lobe/hilar mass, likely bronchogenic carcinoma.  For the last 3 weeks patient has been experiencing cough with hemoptysis tea spoon, associated with dyspnea and weight loss. Over last 2 days amount of hemoptysis got worse and he noted more severe dyspnea.  On his initial physical examination his blood pressure was 167/95 199/100, 200/103, heart rate 107, respiratory rate 24, temperature 97.9 F, oxygen saturation 98%, his lungs had decreased breath sounds bilaterally, heart S1-S2, present rhythmic, abdomen soft, no lower extremity edema. Sodium 132, potassium 4.0, chloride 91, bicarb 27, glucose 424, BUN 21, creatinine 1.44, white count 6.1, hemoglobin 12.5, hematocrit 38.3, platelets 254.  SARS COVID-19 negative.  Chest radiograph with right hilar mass.  1.  New onset hemoptysis in the setting of right hilar mass/very high pretest probability for malignancy.  Patient was admitted to the medical ward, he had oximetry monitoring and amount of  hemoptysis objectively quantified.  His hemoglobin drop from 12.5 from admission to 9.6 at discharge.  Patient was placed on antitussive agents, and the amount of hemoptysis was very small.  Patient was seen by pulmonary, Dr. Lake Bells, with recommendations for outpatient diagnostic bronchoscopy. At the time of his discharge his oximetry is 96% on room air, and his hemoglobin is 9.6, hematocrit 29.6 with 200 platelets.  2.  Uncontrolled type 2 diabetes mellitus (Hgb A1c 10,6), dyslipidemia.  Patient was placed on insulin sliding scale for glucose coverage monitoring.  He received 10 units of basal insulin.  His fasting glucose on the day of  discharge 208-198.  At discharge he will resume insulin regimen.  3.  Chronic kidney disease stage IIIa.  Patient's kidney function remained stable during his hospitalization.  He did receive intravenous fluids.  His discharge creatinine was 2.0, BUN 28, sodium 133, potassium 3.7, chloride 99 and bicarb 25.  4.  Oral thrush.  Patient has been placed on nystatin.  5.  Dementia/depression.  Patient continue venlafaxine, memantine and donepezil.  6. HTN. Continue blood pressure control with lisinopril and atenolol.   I spoke over the phone with the patient's grnadson about patient's  condition, plan of care, prognosis and all questions were addressed.  Discharge Diagnoses:  Principal Problem:   Lung cancer ([place]) Active Problems:   GOUT   Major depressive disorder, recurrent episode, in partial remission (HCC)   Essential hypertension   Diabetic neuropathy (HCC)   OSA (obstructive sleep apnea)   Hypogonadism male   Chronic kidney disease, stage III (moderate)   Former very heavy cigarette smoker (more than 40 per day)   Uncontrolled type 2 diabetes  mellitus with peripheral neuropathy (HCC)   Diabetic foot ulcer ([place])   Mild cognitive impairment   Hemoptysis    Discharge Instructions   Allergies as of 10/14/2019      Reactions   Tetracycline  Itching, Rash      Medication List    STOP taking these medications   ampicillin 500 MG capsule Commonly known as: PRINCIPEN   aspirin EC 81 MG tablet   colchicine 0.6 MG tablet   feeding supplement (GLUCERNA SHAKE) Liqd   indomethacin 50 MG capsule Commonly known as: INDOCIN   nutrition supplement (JUVEN) Pack   ondansetron 4 MG tablet Commonly known as: Zofran   ondansetron 8 MG disintegrating tablet Commonly known as: ZOFRAN-ODT   Ozempic (0.25 or 0.5 MG/DOSE) 2 MG/1.5ML Sopn Generic drug: Semaglutide(0.25 or 0.5MG /DOS)   sildenafil 20 MG tablet Commonly known as: Revatio     TAKE these medications   atenolol 50 MG tablet Commonly known as: TENORMIN TAKE 1 TABLET BY MOUTH EVERY DAY   cholecalciferol 1000 units tablet Commonly known as: VITAMIN D Take 1,000 Units by mouth daily.   CLEAR EYES COMPLETE OP Place 1 drop into both eyes daily as needed (dry eyes).   donepezil 10 MG tablet Commonly known as: ARICEPT TAKE 1 TABLET BY MOUTH AT BEDTIME   doxazosin 8 MG tablet Commonly known as: CARDURA TAKE ONE-HALF TABLET AT BEDTIME What changed: See the new instructions.   fluticasone 50 MCG/ACT nasal spray Commonly known as: FLONASE PLACE 2 SPRAYS IN EACH NOSTRIL DAILY What changed:   how much to take  how to take this  when to take this  reasons to take this  additional instructions   guaiFENesin-dextromethorphan 100-10 MG/5ML syrup Commonly known as: ROBITUSSIN DM Take 5 mLs by mouth every 6 (six) hours as needed for cough.   HumuLIN R U-500 KwikPen 500 UNIT/ML kwikpen Generic drug: insulin regular human CONCENTRATED Inject under skin 80-90 units before b'fast, 60 units before lunch, 80-90 units vefore dinner What changed:   how much to take  how to take this  when to take this  additional instructions   lisinopril 10 MG tablet Commonly known as: ZESTRIL TAKE 1 TABLET BY MOUTH EVERY DAY   memantine 10 MG tablet Commonly known as:  NAMENDA TAKE 1 TABLET BY MOUTH TWICE A DAY   montelukast 10 MG tablet Commonly known as: SINGULAIR Take 1 tablet (10 mg total) by mouth at bedtime.   nystatin 100000 UNIT/ML suspension Commonly known as: MYCOSTATIN Take 5 mLs (500,000 Units total) by mouth 4 (four) times daily for 14 days.   omeprazole 40 MG capsule Commonly known as: PRILOSEC Take 1 capsule (40 mg total) by mouth 2 (two) times daily before a meal. What changed: when to take this   Pen Needles 31G X 5 MM Misc 1 each by Does not apply route 3 (three) times daily. To inject insulin E11.42   QC Acetaminophen 8Hr Arth Pain 650 MG CR tablet Generic drug: acetaminophen Take 650 mg by mouth every 8 (eight) hours as needed for pain.   simvastatin 40 MG tablet Commonly known as: ZOCOR TAKE 1 TABLET BY MOUTH AT BEDTIME What changed: when to take this   Testosterone Cypionate 150 MG/ML Soln Inject 1 mL into the muscle every 14 (fourteen) days.   venlafaxine XR 75 MG 24 hr capsule Commonly known as: EFFEXOR-XR TAKE 1 CAPSULE BY MOUTH EVERY MORNING AND 2 CAPSULES AT BEDTIME What changed:   how much to take  how to take this  when to take this  additional instructions   vitamin C 1000 MG tablet Take 1,000 mg by mouth daily.       Follow-up Information    Copland, Spencer, MD Follow up in 1 week(s).   Specialties: Family Medicine, Sports Medicine Contact information: [place] [address] [phone]        Juanito Doom, MD Follow up.   Specialty: Pulmonary Disease Why: as scheduld for bronchoscopy.  Contact information: [address] [place] [address] [phone]              Allergies  Allergen Reactions  . Tetracycline Itching and Rash    Consultations:  Pulmonary    Procedures/Studies: DG Chest 2 View  Result Date: 10/12/2019 CLINICAL DATA:  Hemoptysis, fall EXAM: CHEST - 2 VIEW COMPARISON:  Chest x-ray 10/10/2019, CT chest  10/11/2019 FINDINGS: No pleural effusion or pneumothorax. No interval consolidation. Similar right hilar mass and adjacent adenopathy. Normal heart size. No pneumothorax. Degenerative changes of the spine. Rounded metallic density superiorly on lateral view appears external to the patient on frontal view. IMPRESSION: Similar right hilar mass and adjacent adenopathy. No acute interval changes. Electronically Signed   By: Donavan Foil M.D.   On: 10/12/2019 17:48   DG Ribs Unilateral W/Chest Left  Result Date: 10/10/2019 CLINICAL DATA:  Pain following fall EXAM: LEFT RIBS AND CHEST - 3+ VIEW COMPARISON:  August 01, 2018 FINDINGS: Frontal chest as well as oblique and cone-down rib images were obtained. There is a mass in the right perihilar region measuring 5.7 x 4.5 cm. There is suspected adenopathy in the right hilum and azygos regions. Lungs elsewhere are clear. Heart size and pulmonary vascularity are normal. No adenopathy. There is no evident pneumothorax or pleural effusion. No evident rib fracture. There is apparent soft tissue prominence in the left anterior hemithorax. IMPRESSION: 1.  No evident rib fracture. 2. Apparent mass in or overlying the right perihilar region measuring 5.7 x 4.5 cm with probable adenopathy in the right hilum and azygos regions. Advise correlation with contrast enhanced chest CT to further evaluate. 3. Soft tissue prominence anterior left hemithorax of uncertain etiology. Particular attention this area on CT advised. These results will be called to the ordering clinician or representative by the Radiologist Assistant, and communication documented in the PACS or [employer]. Electronically Signed   By: Lowella Grip III M.D.   On: 10/10/2019 11:44   CT Chest W Contrast  Result Date: 10/11/2019 CLINICAL DATA:  Abnormal chest x-ray with RIGHT hilar mass EXAM: CT CHEST WITH CONTRAST TECHNIQUE: Multidetector CT imaging of the chest was performed during intravenous contrast  administration. CONTRAST:  44mL OMNIPAQUE IOHEXOL 300 MG/ML  SOLN COMPARISON:  Chest radiograph 10/10/2018 FINDINGS: Cardiovascular: No significant vascular findings. Normal heart size. No pericardial effusion. Mediastinum/Nodes: No axillary supraclavicular adenopathy. The small benign appearing cyst in the LEFT lobe of thyroid gland measuring 10 mm. Bulky RIGHT paratracheal adenopathy. RIGHT lower paratracheal lymph node measures 32 mm short axis. Smaller high RIGHT paratracheal lymph nodes measuring 6 mm on image 23/2. Large subcarinal lymph node measuring 21 mm. Large hilar /perihilar mass posterior to the RIGHT hilum along the bronchus intermedius and RIGHT lower lobe bronchus measures 5.5 by 5.6 cm and demonstrates uniform enhancement. Smaller pleural base nodule measuring 2.5 cm posterior to the mass on image 53/2. Additional peripheral nodule in the RIGHT lower lobe along the pleural surface measures 13  mm on image 76/3. Lungs/Pleura: RIGHT lower lobe/RIGHT hilar mass described in the mediastinal section. Mass does span the oblique fissure into the RIGHT upper lobe seen on sagittal imaging Upper Abdomen: Limited view of the liver, kidneys, pancreas are unremarkable. Normal adrenal glands. Musculoskeletal: No aggressive osseous lesion IMPRESSION: 1. Large RIGHT lower lobe/RIGHT hilar mass most consistent with primary bronchogenic carcinoma (consider small cell lung cancer). Mass does across the RIGHT oblique fissure into the RIGHT upper lobe. Mass also narrows the RIGHT lower lobe bronchus. 2. Bulky RIGHT lower paratracheal and subcarinal adenopathy. Smaller metastatic nodes in the high RIGHT paratracheal nodal station. 3. No evidence of supraclavicular nodal metastasis. 4. Two peripheral nodules in the RIGHT lower lobe likely metastatic deposits or primary lesion. 5. If multidisciplinary follow up management is desired, this is available in the [facility] through the [facility]  [phone]. These results will be called to the ordering clinician or representative by the Radiologist Assistant, and communication documented in the PACS or [employer]. Electronically Signed   By: Suzy Bouchard M.D.   On: 10/11/2019 15:40        Subjective: Patient is feeling better, cough and dyspnea have improved, no nausea or vomiting, no chest pain and tolerating po well.   Discharge Exam: Vitals:   10/13/19 1327 10/14/19 0547  BP: (!) 104/56 (!) 106/57  Pulse: 66 77  Resp: 14 17  Temp: 97.6 F (36.4 C) 98.4 F (36.9 C)  SpO2: 96% 97%   Vitals:   10/13/19 0603 10/13/19 0945 10/13/19 1327 10/14/19 0547  BP: 100/63 111/63 (!) 104/56 (!) 106/57  Pulse: 75 73 66 77  Resp: 15 14 14 17   Temp: 97.6 F (36.4 C) 97.7 F (36.5 C) 97.6 F (36.4 C) 98.4 F (36.9 C)  TempSrc: Oral   Oral  SpO2: 100% 93% 96% 97%  Weight:      Height:        General: Not in pain or dyspnea.  Neurology: Awake and alert, non focal  E ENT: mild pallor, no icterus, oral mucosa moist Cardiovascular: No JVD. S1-S2 present, rhythmic, no gallops, rubs, or murmurs. No lower extremity edema. Pulmonary: vesicular breath sounds bilaterally, adequate air movement, no wheezing, rhonchi or rales. Gastrointestinal. Abdomen with, no organomegaly, non tender, no rebound or guarding Skin. No rashes Musculoskeletal: no joint deformities   The results of significant diagnostics from this hospitalization (including imaging, microbiology, ancillary and laboratory) are listed below for reference.     Microbiology: Recent Results (from the past 240 hour(s))  SARS Coronavirus 2 by RT PCR (hospital order, performed in [facility] hospital lab) Nasopharyngeal Nasopharyngeal Swab     Status: None   Collection Time: 10/12/19  5:30 PM   Specimen: Nasopharyngeal Swab  Result Value Ref Range Status   SARS Coronavirus 2 NEGATIVE NEGATIVE Final    Comment: (NOTE) SARS-CoV-2 target nucleic acids are NOT  DETECTED.  The SARS-CoV-2 RNA is generally detectable in upper and lower respiratory specimens during the acute phase of infection. The lowest concentration of SARS-CoV-2 viral copies this assay can detect is 250 copies / mL. A negative result does not preclude SARS-CoV-2 infection and should not be used as the sole basis for treatment or other patient management decisions.  A negative result may occur with improper specimen collection / handling, submission of specimen other than nasopharyngeal swab, presence of viral mutation(s) within the areas targeted by this assay, and inadequate number of viral copies (<250 copies / mL). A  negative result must be combined with clinical observations, patient history, and epidemiological information.  Fact Sheet for Patients:   StrictlyIdeas.no  Fact Sheet for Healthcare Providers: BankingDealers.co.za  This test is not yet approved or  cleared by the [place] FDA and has been authorized for detection and/or diagnosis of SARS-CoV-2 by FDA under an Emergency Use Authorization (EUA).  This EUA will remain in effect (meaning this test can be used) for the duration of the COVID-19 declaration under Section 564(b)(1) of the Act, 21 U.S.C. section 360bbb-3(b)(1), unless the authorization is terminated or revoked sooner.  Performed at [facility], [place] [address] [place], [place] [address]      Labs: BNP (last 3 results) No results for input(s): BNP in the last 8760 hours. Basic Metabolic Panel: Recent Labs  Lab 10/10/19 1221 10/12/19 1727 10/13/19 0435 10/14/19 0514  NA 131* 132* 134* 133*  K 4.8 4.0 3.8 3.7  CL 93* 91* 96* 99  CO2 27 27 29 25   GLUCOSE 354* 424* 327* 208*  BUN 16 21 19  28*  CREATININE 1.52* 1.44* 1.43* 2.08*  CALCIUM 9.5 8.7* 8.7* 8.2*   Liver Function Tests: Recent Labs  Lab 10/10/19 1221  AST 12  ALT 11  ALKPHOS 89  BILITOT 0.7  PROT  7.6  ALBUMIN 3.8   No results for input(s): LIPASE, AMYLASE in the last 168 hours. No results for input(s): AMMONIA in the last 168 hours. CBC: Recent Labs  Lab 10/10/19 1221 10/12/19 1727 10/13/19 1412 10/14/19 0514  WBC 11.1* 6.1  --  7.8  NEUTROABS 8.7* 4.4  --  5.3  HGB 12.4* 12.5* 10.3* 9.6*  HCT 37.5* 38.3* 31.9* 29.6*  MCV 84.5 84.5  --  85.3  PLT 230.0 254  --  200   Cardiac Enzymes: No results for input(s): CKTOTAL, CKMB, CKMBINDEX, TROPONINI in the last 168 hours. BNP: Invalid input(s): POCBNP CBG: Recent Labs  Lab 10/13/19 0717 10/13/19 1127 10/13/19 1640 10/13/19 2058 10/14/19 0733  GLUCAP 352* 324* 150* 138* 198*   D-Dimer No results for input(s): DDIMER in the last 72 hours. Hgb A1c No results for input(s): HGBA1C in the last 72 hours. Lipid Profile No results for input(s): CHOL, HDL, LDLCALC, TRIG, CHOLHDL, LDLDIRECT in the last 72 hours. Thyroid function studies No results for input(s): TSH, T4TOTAL, T3FREE, THYROIDAB in the last 72 hours.  Invalid input(s): FREET3 Anemia work up No results for input(s): VITAMINB12, FOLATE, FERRITIN, TIBC, IRON, RETICCTPCT in the last 72 hours. Urinalysis    Component Value Date/Time   COLORURINE YELLOW 04/06/2019 2347   APPEARANCEUR HAZY (A) 04/06/2019 2347   LABSPEC 1.009 04/06/2019 2347   PHURINE 5.0 04/06/2019 2347   GLUCOSEU >=500 (A) 04/06/2019 2347   HGBUR LARGE (A) 04/06/2019 2347   BILIRUBINUR NEGATIVE 04/06/2019 2347   BILIRUBINUR 1+ 09/10/2014 1615   KETONESUR 5 (A) 04/06/2019 2347   PROTEINUR 100 (A) 04/06/2019 2347   UROBILINOGEN 0.2 12/05/2014 2146   NITRITE NEGATIVE 04/06/2019 2347   LEUKOCYTESUR NEGATIVE 04/06/2019 2347   Sepsis Labs Invalid input(s): PROCALCITONIN,  WBC,  LACTICIDVEN Microbiology Recent Results (from the past 240 hour(s))  SARS Coronavirus 2 by RT PCR (hospital order, performed in [facility] hospital lab) Nasopharyngeal Nasopharyngeal Swab     Status: None    Collection Time: 10/12/19  5:30 PM   Specimen: Nasopharyngeal Swab  Result Value Ref Range Status   SARS Coronavirus 2 NEGATIVE NEGATIVE Final    Comment: (NOTE) SARS-CoV-2 target nucleic acids are  NOT DETECTED.  The SARS-CoV-2 RNA is generally detectable in upper and lower respiratory specimens during the acute phase of infection. The lowest concentration of SARS-CoV-2 viral copies this assay can detect is 250 copies / mL. A negative result does not preclude SARS-CoV-2 infection and should not be used as the sole basis for treatment or other patient management decisions.  A negative result may occur with improper specimen collection / handling, submission of specimen other than nasopharyngeal swab, presence of viral mutation(s) within the areas targeted by this assay, and inadequate number of viral copies (<250 copies / mL). A negative result must be combined with clinical observations, patient history, and epidemiological information.  Fact Sheet for Patients:   StrictlyIdeas.no  Fact Sheet for Healthcare Providers: BankingDealers.co.za  This test is not yet approved or  cleared by the [place] FDA and has been authorized for detection and/or diagnosis of SARS-CoV-2 by FDA under an Emergency Use Authorization (EUA).  This EUA will remain in effect (meaning this test can be used) for the duration of the COVID-19 declaration under Section 564(b)(1) of the Act, 21 U.S.C. section 360bbb-3(b)(1), unless the authorization is terminated or revoked sooner.  Performed at [facility], [place] [address], [place],  [address]      Time coordinating discharge: 45 minutes  SIGNED:   Tawni Millers, MD  [facility] 10/14/2019, 9:34 AM

## 2019-10-16 ENCOUNTER — Ambulatory Visit (HOSPITAL_COMMUNITY): Admission: RE | Admit: 2019-10-16 | Payer: Medicare Other | Source: Ambulatory Visit

## 2019-10-16 ENCOUNTER — Telehealth: Payer: Self-pay

## 2019-10-16 ENCOUNTER — Encounter: Payer: Self-pay | Admitting: *Deleted

## 2019-10-16 ENCOUNTER — Telehealth: Payer: Self-pay | Admitting: Family Medicine

## 2019-10-16 ENCOUNTER — Telehealth: Payer: Self-pay | Admitting: Internal Medicine

## 2019-10-16 ENCOUNTER — Telehealth: Payer: Self-pay | Admitting: Physician Assistant

## 2019-10-16 DIAGNOSIS — C3490 Malignant neoplasm of unspecified part of unspecified bronchus or lung: Secondary | ICD-10-CM

## 2019-10-16 NOTE — Telephone Encounter (Signed)
Patient called re: Patient requests to speak with Dr. Cruzita Lederer re: new diagnosis that affects patient's blood sugars/medications. Patient's ph# [phone].

## 2019-10-16 NOTE — Telephone Encounter (Signed)
Received a new pt referral from Dr. Lorelei Pont at [facility] for lung cancer. [name] has been scheduled to see [name] on 7/13 at 230pm w/labs at 2pm. Appt date and time has been given to the pt's son. Aware to arrive 20 minutes early.

## 2019-10-16 NOTE — Progress Notes (Signed)
Contacted pt to review preop instructions for procedure on 10/17/19.  Patient stated he has not yet received preprocedure  instructions from office of Dr Lake Bells .  Instructed pt to call office of Dr Lake Bells for preprocedure instructions.  Patient has telephone number.  Instructed patient after he speaks with office of Dr Lake Bells to call office of Dr Cruzita Lederer for preprocedure instructions regarding U-500 Insuilin.  Patient voiced understanding.

## 2019-10-16 NOTE — Telephone Encounter (Signed)
I have not been able to reach him on the phone this AM.  Charmaine or Butch Penny can you call him.  If we cannot reach him, then we may have to call his son or grandson.  1.  His PET scan for some reason (unclear) was moved to Thursday.  He needs to be at [facility] between 9 - 9:15 AM for his PET scan. 2.   He is having a bronchoscopy by Dr. Lake Bells from Pulmonology at [facility] on [address].  Make sure he knows where to go. 3.  The thoracic oncology clinic is actively managing him behind the scenes, and they will schedule  appointments with the lung cancer clinic for him.  I would think it would be after his PET  scan and bronchoscopy.

## 2019-10-16 NOTE — Progress Notes (Signed)
Spoke with patient and patient stated he called office of DR Lake Bells and received preprocedure instruction of NPO after midnite tonite and patient stated he spoke with Dr Cruzita Lederer and she gave him preprocedure instructions in regards to his U-500 Insulin.  Patient states he will take no U-500 Insulin in am.on 10/17/19.

## 2019-10-16 NOTE — Telephone Encounter (Signed)
Thank you :)

## 2019-10-16 NOTE — Progress Notes (Signed)
I updated scheduling on when to call and schedule patient.  Appt for [name] on 10/23/19 labs at 2:00 and to see her at 2:30.

## 2019-10-16 NOTE — Telephone Encounter (Signed)
[name] spoke with patient.  PET scan was cancelled by the pre-service center because they didn't think the PA had been done and it was so they rescheduled him to Thursday.  She also advised [name] to call [name] office for details on his bronchoscopy.  After these two test are done, then Oncology will contact patient.  FYI to Dr. Lorelei Pont.

## 2019-10-16 NOTE — Telephone Encounter (Signed)
Transition Care Management Follow-up Telephone Call  Date of discharge and from where: 10/14/2019, [facility]  How have you been since you were released from the hospital? Patient states that he is doing good since getting home.  Any questions or concerns? No   Items Reviewed:  Did the pt receive and understand the discharge instructions provided? Yes   Medications obtained and verified? Yes   Any new allergies since your discharge? No   Dietary orders reviewed? Yes  Do you have support at home? Yes   Functional Questionnaire: (I = Independent and D = Dependent) ADLs: I  Bathing/Dressing- I  Meal Prep- I  Eating- I  Maintaining continence- I  Transferring/Ambulation- I  Managing Meds- I  Follow up appointments reviewed:   PCP Hospital f/u appt confirmed? No  Patient states he has a lot of appointments currently with his specialist. He will call back if he feels the need to follow up with Dr. [facility] f/u appt confirmed? Yes  Scheduled to see pulmonary   Are transportation arrangements needed? No   If their condition worsens, is the pt aware to call PCP or go to the Emergency Dept.? Yes  Was the patient provided with contact information for the PCP's office or ED? Yes  Was to pt encouraged to call back with questions or concerns? Yes

## 2019-10-16 NOTE — Telephone Encounter (Signed)
I called and d/w the pt. New diagnosis of lung cancer during investigation for rib trauma.  He will have a bronchoscopy tomorrow and then a PET scan the day after tomorrow. He was not able to obtain Ozempic, but was able to obtain U500 insulin, of which he is now taking 40-30-40 units before the 3 meals of the day.  Sugars have been in the 200s but in the last 2 to 3 days, due to stress, they have increased to the 300s.  I advised him to increase the doses to 50-40-50 units and let me know how the sugars are doing after the change.

## 2019-10-16 NOTE — Telephone Encounter (Signed)
error 

## 2019-10-16 NOTE — Telephone Encounter (Signed)
Dr. Cruzita Lederer I have reviewed his chart and he was just Dx with lung cancer. Patient requesting to speak with you.

## 2019-10-17 ENCOUNTER — Encounter (HOSPITAL_COMMUNITY): Payer: Self-pay | Admitting: Pulmonary Disease

## 2019-10-17 ENCOUNTER — Observation Stay (HOSPITAL_COMMUNITY)
Admission: AD | Admit: 2019-10-17 | Discharge: 2019-10-18 | Disposition: A | Discharge status: Home / Self Care | Payer: Medicare Other | Attending: Pulmonary Disease | Admitting: Pulmonary Disease

## 2019-10-17 ENCOUNTER — Other Ambulatory Visit: Payer: Self-pay

## 2019-10-17 ENCOUNTER — Encounter (HOSPITAL_COMMUNITY)
Admission: AD | Disposition: A | Discharge status: Home / Self Care | Payer: Self-pay | Source: Home / Self Care | Attending: Pulmonary Disease

## 2019-10-17 ENCOUNTER — Ambulatory Visit (HOSPITAL_COMMUNITY): Payer: Medicare Other | Admitting: Physician Assistant

## 2019-10-17 ENCOUNTER — Encounter: Payer: Self-pay | Admitting: *Deleted

## 2019-10-17 DIAGNOSIS — Z87891 Personal history of nicotine dependence: Secondary | ICD-10-CM | POA: Insufficient documentation

## 2019-10-17 DIAGNOSIS — Z79899 Other long term (current) drug therapy: Secondary | ICD-10-CM | POA: Insufficient documentation

## 2019-10-17 DIAGNOSIS — E1165 Type 2 diabetes mellitus with hyperglycemia: Secondary | ICD-10-CM | POA: Insufficient documentation

## 2019-10-17 DIAGNOSIS — C3490 Malignant neoplasm of unspecified part of unspecified bronchus or lung: Secondary | ICD-10-CM | POA: Diagnosis not present

## 2019-10-17 DIAGNOSIS — R042 Hemoptysis: Secondary | ICD-10-CM

## 2019-10-17 DIAGNOSIS — I129 Hypertensive chronic kidney disease with stage 1 through stage 4 chronic kidney disease, or unspecified chronic kidney disease: Secondary | ICD-10-CM | POA: Insufficient documentation

## 2019-10-17 DIAGNOSIS — R918 Other nonspecific abnormal finding of lung field: Secondary | ICD-10-CM

## 2019-10-17 DIAGNOSIS — Z794 Long term (current) use of insulin: Secondary | ICD-10-CM | POA: Insufficient documentation

## 2019-10-17 DIAGNOSIS — C771 Secondary and unspecified malignant neoplasm of intrathoracic lymph nodes: Secondary | ICD-10-CM | POA: Diagnosis not present

## 2019-10-17 DIAGNOSIS — E1122 Type 2 diabetes mellitus with diabetic chronic kidney disease: Secondary | ICD-10-CM | POA: Diagnosis not present

## 2019-10-17 DIAGNOSIS — N183 Chronic kidney disease, stage 3 unspecified: Secondary | ICD-10-CM | POA: Diagnosis not present

## 2019-10-17 DIAGNOSIS — C801 Malignant (primary) neoplasm, unspecified: Secondary | ICD-10-CM | POA: Diagnosis not present

## 2019-10-17 HISTORY — PX: BRONCHIAL NEEDLE ASPIRATION BIOPSY: SHX5106

## 2019-10-17 HISTORY — PX: ENDOBRONCHIAL ULTRASOUND: SHX5096

## 2019-10-17 HISTORY — PX: HEMOSTASIS CONTROL: SHX6838

## 2019-10-17 LAB — COMPREHENSIVE METABOLIC PANEL
ALT: 24 U/L (ref 0–44)
AST: 19 U/L (ref 15–41)
Albumin: 3.3 g/dL — ABNORMAL LOW (ref 3.5–5.0)
Alkaline Phosphatase: 125 U/L (ref 38–126)
Anion gap: 16 — ABNORMAL HIGH (ref 5–15)
BUN: 21 mg/dL (ref 8–23)
CO2: 25 mmol/L (ref 22–32)
Calcium: 9 mg/dL (ref 8.9–10.3)
Chloride: 91 mmol/L — ABNORMAL LOW (ref 98–111)
Creatinine, Ser: 1.53 mg/dL — ABNORMAL HIGH (ref 0.61–1.24)
GFR calc Af Amer: 52 mL/min — ABNORMAL LOW (ref >60)
GFR calc non Af Amer: 44 mL/min — ABNORMAL LOW (ref >60)
Glucose, Bld: 560 mg/dL (ref 70–99)
Potassium: 4.8 mmol/L (ref 3.5–5.1)
Sodium: 132 mmol/L — ABNORMAL LOW (ref 135–145)
Total Bilirubin: 0.7 mg/dL (ref 0.3–1.2)
Total Protein: 8.5 g/dL — ABNORMAL HIGH (ref 6.5–8.1)

## 2019-10-17 LAB — GLUCOSE, CAPILLARY
Glucose-Capillary: 232 mg/dL — ABNORMAL HIGH (ref 70–99)
Glucose-Capillary: 319 mg/dL — ABNORMAL HIGH (ref 70–99)
Glucose-Capillary: 397 mg/dL — ABNORMAL HIGH (ref 70–99)
Glucose-Capillary: 436 mg/dL — ABNORMAL HIGH (ref 70–99)
Glucose-Capillary: 550 mg/dL (ref 70–99)
Glucose-Capillary: 590 mg/dL (ref 70–99)
Glucose-Capillary: >600 mg/dL (ref 70–99)

## 2019-10-17 LAB — BASIC METABOLIC PANEL
Anion gap: 11 (ref 5–15)
BUN: 23 mg/dL (ref 8–23)
CO2: 26 mmol/L (ref 22–32)
Calcium: 8.5 mg/dL — ABNORMAL LOW (ref 8.9–10.3)
Chloride: 94 mmol/L — ABNORMAL LOW (ref 98–111)
Creatinine, Ser: 1.66 mg/dL — ABNORMAL HIGH (ref 0.61–1.24)
GFR calc Af Amer: 47 mL/min — ABNORMAL LOW (ref >60)
GFR calc non Af Amer: 40 mL/min — ABNORMAL LOW (ref >60)
Glucose, Bld: 477 mg/dL — ABNORMAL HIGH (ref 70–99)
Potassium: 4.5 mmol/L (ref 3.5–5.1)
Sodium: 131 mmol/L — ABNORMAL LOW (ref 135–145)

## 2019-10-17 LAB — APTT: aPTT: 32 seconds (ref 24–36)

## 2019-10-17 LAB — PROTIME-INR
INR: 1.1 (ref 0.8–1.2)
Prothrombin Time: 13.9 seconds (ref 11.4–15.2)

## 2019-10-17 LAB — CBC
HCT: 37.4 % — ABNORMAL LOW (ref 39.0–52.0)
Hemoglobin: 12 g/dL — ABNORMAL LOW (ref 13.0–17.0)
MCH: 27.7 pg (ref 26.0–34.0)
MCHC: 32.1 g/dL (ref 30.0–36.0)
MCV: 86.4 fL (ref 80.0–100.0)
Platelets: 324 10*3/uL (ref 150–400)
RBC: 4.33 MIL/uL (ref 4.22–5.81)
RDW: 12.9 % (ref 11.5–15.5)
WBC: 8.5 10*3/uL (ref 4.0–10.5)
nRBC: 0 % (ref 0.0–0.2)

## 2019-10-17 LAB — MRSA PCR SCREENING: MRSA by PCR: NEGATIVE

## 2019-10-17 SURGERY — ENDOBRONCHIAL ULTRASOUND (EBUS)
Anesthesia: General | Laterality: Bilateral

## 2019-10-17 MED ORDER — VENLAFAXINE HCL ER 37.5 MG PO CP24: 37.5000 mg | ORAL_CAPSULE | Freq: Every day | ORAL | Status: DC

## 2019-10-17 MED ORDER — SIMVASTATIN 40 MG PO TABS: 40.0000 mg | ORAL_TABLET | Freq: Every day | ORAL | Status: DC

## 2019-10-17 MED ORDER — LIDOCAINE 2% (20 MG/ML) 5 ML SYRINGE
INTRAMUSCULAR | Status: DC | PRN
  Administered 2019-10-17: 40 mg via INTRAVENOUS

## 2019-10-17 MED ORDER — DEXAMETHASONE SODIUM PHOSPHATE 10 MG/ML IJ SOLN
INTRAMUSCULAR | Status: DC | PRN
  Administered 2019-10-17: 8 mg via INTRAVENOUS

## 2019-10-17 MED ORDER — DOXAZOSIN MESYLATE 8 MG PO TABS: 8.0000 mg | ORAL_TABLET | Freq: Every day | ORAL | Status: DC

## 2019-10-17 MED ORDER — PANTOPRAZOLE SODIUM 40 MG PO TBEC: 40.0000 mg | DELAYED_RELEASE_TABLET | Freq: Every day | ORAL | Status: DC

## 2019-10-17 MED ORDER — ASCORBIC ACID 500 MG PO TABS
1000.0000 mg | ORAL_TABLET | Freq: Every day | ORAL | Status: DC
  Administered 2019-10-18: 1000 mg via ORAL
  Filled 2019-10-17: qty 2

## 2019-10-17 MED ORDER — TRANEXAMIC ACID FOR INHALATION
500.0000 mg | Freq: Once | RESPIRATORY_TRACT | Status: AC
  Administered 2019-10-17: 500 mg via RESPIRATORY_TRACT
  Filled 2019-10-17: qty 10

## 2019-10-17 MED ORDER — ORAL CARE MOUTH RINSE
15.0000 mL | Freq: Two times a day (BID) | OROMUCOSAL | Status: DC
  Administered 2019-10-18 (×2): 15 mL via OROMUCOSAL

## 2019-10-17 MED ORDER — HYDROCOD POLST-CPM POLST ER 10-8 MG/5ML PO SUER: 5.0000 mL | Freq: Two times a day (BID) | ORAL | Status: DC | PRN

## 2019-10-17 MED ORDER — SODIUM CHLORIDE 0.9 % IV SOLN: INTRAVENOUS | Status: DC

## 2019-10-17 MED ORDER — VITAMIN D 25 MCG (1000 UNIT) PO TABS
1000.0000 [IU] | ORAL_TABLET | Freq: Every day | ORAL | Status: DC
  Administered 2019-10-18: 1000 [IU] via ORAL
  Filled 2019-10-17: qty 1

## 2019-10-17 MED ORDER — ALBUTEROL SULFATE (2.5 MG/3ML) 0.083% IN NEBU
2.5000 mg | INHALATION_SOLUTION | Freq: Once | RESPIRATORY_TRACT | Status: AC
  Administered 2019-10-17: 2.5 mg via RESPIRATORY_TRACT

## 2019-10-17 MED ORDER — LACTATED RINGERS IV SOLN: INTRAVENOUS | Status: DC | PRN

## 2019-10-17 MED ORDER — ALBUTEROL SULFATE (2.5 MG/3ML) 0.083% IN NEBU
INHALATION_SOLUTION | RESPIRATORY_TRACT | Status: AC
  Filled 2019-10-17: qty 3

## 2019-10-17 MED ORDER — FENTANYL CITRATE (PF) 100 MCG/2ML IJ SOLN
INTRAMUSCULAR | Status: AC
  Filled 2019-10-17: qty 2

## 2019-10-17 MED ORDER — VENLAFAXINE HCL ER 150 MG PO CP24
150.0000 mg | ORAL_CAPSULE | Freq: Every day | ORAL | Status: DC
  Administered 2019-10-17: 150 mg via ORAL
  Filled 2019-10-17 (×2): qty 1

## 2019-10-17 MED ORDER — ONDANSETRON HCL 4 MG/2ML IJ SOLN
INTRAMUSCULAR | Status: DC | PRN
  Administered 2019-10-17: 4 mg via INTRAVENOUS

## 2019-10-17 MED ORDER — LISINOPRIL 10 MG PO TABS: 10.0000 mg | ORAL_TABLET | Freq: Every day | ORAL | Status: DC

## 2019-10-17 MED ORDER — SIMVASTATIN 40 MG PO TABS
40.0000 mg | ORAL_TABLET | Freq: Every day | ORAL | Status: DC
  Administered 2019-10-17: 40 mg via ORAL
  Filled 2019-10-17 (×2): qty 1

## 2019-10-17 MED ORDER — ATENOLOL 50 MG PO TABS: 50.0000 mg | ORAL_TABLET | Freq: Every day | ORAL | Status: DC

## 2019-10-17 MED ORDER — DOCUSATE SODIUM 100 MG PO CAPS: 100.0000 mg | ORAL_CAPSULE | Freq: Two times a day (BID) | ORAL | Status: DC | PRN

## 2019-10-17 MED ORDER — PROPOFOL 10 MG/ML IV BOLUS
INTRAVENOUS | Status: AC
  Filled 2019-10-17: qty 20

## 2019-10-17 MED ORDER — FLUTICASONE PROPIONATE 50 MCG/ACT NA SUSP
2.0000 | Freq: Every day | NASAL | Status: DC | PRN
  Filled 2019-10-17: qty 16

## 2019-10-17 MED ORDER — DONEPEZIL HCL 10 MG PO TABS
10.0000 mg | ORAL_TABLET | Freq: Every day | ORAL | Status: DC
  Administered 2019-10-17: 10 mg via ORAL
  Filled 2019-10-17: qty 1

## 2019-10-17 MED ORDER — CHLORHEXIDINE GLUCONATE 0.12 % MT SOLN
15.0000 mL | Freq: Two times a day (BID) | OROMUCOSAL | Status: DC
  Administered 2019-10-18: 15 mL via OROMUCOSAL
  Filled 2019-10-17 (×2): qty 15

## 2019-10-17 MED ORDER — DONEPEZIL HCL 10 MG PO TABS: 10.0000 mg | ORAL_TABLET | Freq: Every day | ORAL | Status: DC

## 2019-10-17 MED ORDER — VENLAFAXINE HCL ER 75 MG PO CP24
75.0000 mg | ORAL_CAPSULE | Freq: Every day | ORAL | Status: DC
  Administered 2019-10-18: 75 mg via ORAL
  Filled 2019-10-17: qty 1

## 2019-10-17 MED ORDER — DEXTROSE 50 % IV SOLN: 0.0000 mL | INTRAVENOUS | Status: DC | PRN

## 2019-10-17 MED ORDER — SUGAMMADEX SODIUM 200 MG/2ML IV SOLN
INTRAVENOUS | Status: DC | PRN
  Administered 2019-10-17: 200 mg via INTRAVENOUS

## 2019-10-17 MED ORDER — INSULIN ASPART 100 UNIT/ML ~~LOC~~ SOLN
0.0000 [IU] | Freq: Three times a day (TID) | SUBCUTANEOUS | Status: DC
  Administered 2019-10-17: 15 [IU] via SUBCUTANEOUS

## 2019-10-17 MED ORDER — DEXTROSE-NACL 5-0.45 % IV SOLN: INTRAVENOUS | Status: DC

## 2019-10-17 MED ORDER — ROCURONIUM BROMIDE 10 MG/ML (PF) SYRINGE
PREFILLED_SYRINGE | INTRAVENOUS | Status: DC | PRN
  Administered 2019-10-17 (×2): 50 mg via INTRAVENOUS

## 2019-10-17 MED ORDER — DOXAZOSIN MESYLATE 1 MG PO TABS
4.0000 mg | ORAL_TABLET | Freq: Every day | ORAL | Status: DC
  Administered 2019-10-17 – 2019-10-18 (×2): 4 mg via ORAL
  Filled 2019-10-17: qty 1
  Filled 2019-10-17: qty 4

## 2019-10-17 MED ORDER — INSULIN ASPART 100 UNIT/ML ~~LOC~~ SOLN: 0.0000 [IU] | Freq: Every day | SUBCUTANEOUS | Status: DC

## 2019-10-17 MED ORDER — ATENOLOL 25 MG PO TABS
50.0000 mg | ORAL_TABLET | Freq: Every day | ORAL | Status: DC
  Administered 2019-10-17 – 2019-10-18 (×2): 50 mg via ORAL
  Filled 2019-10-17: qty 1
  Filled 2019-10-17: qty 2

## 2019-10-17 MED ORDER — MONTELUKAST SODIUM 10 MG PO TABS
10.0000 mg | ORAL_TABLET | Freq: Every day | ORAL | Status: DC
  Administered 2019-10-17: 10 mg via ORAL
  Filled 2019-10-17: qty 1

## 2019-10-17 MED ORDER — PROPOFOL 10 MG/ML IV BOLUS
INTRAVENOUS | Status: DC | PRN
  Administered 2019-10-17: 80 mg via INTRAVENOUS

## 2019-10-17 MED ORDER — POLYETHYLENE GLYCOL 3350 17 G PO PACK: 17.0000 g | PACK | Freq: Every day | ORAL | Status: DC | PRN

## 2019-10-17 MED ORDER — INSULIN REGULAR(HUMAN) IN NACL 100-0.9 UT/100ML-% IV SOLN
INTRAVENOUS | Status: DC
  Administered 2019-10-17: 13 [IU]/h via INTRAVENOUS
  Filled 2019-10-17 (×2): qty 100

## 2019-10-17 MED ORDER — LACTATED RINGERS IV SOLN: INTRAVENOUS | Status: DC

## 2019-10-17 MED ORDER — MEMANTINE HCL 5 MG PO TABS
10.0000 mg | ORAL_TABLET | Freq: Two times a day (BID) | ORAL | Status: DC
  Administered 2019-10-17 – 2019-10-18 (×2): 10 mg via ORAL
  Filled 2019-10-17 (×2): qty 2

## 2019-10-17 MED ORDER — PANTOPRAZOLE SODIUM 40 MG PO TBEC
40.0000 mg | DELAYED_RELEASE_TABLET | Freq: Every day | ORAL | Status: DC
  Administered 2019-10-17 – 2019-10-18 (×2): 40 mg via ORAL
  Filled 2019-10-17 (×2): qty 1

## 2019-10-17 MED ORDER — SODIUM CHLORIDE 0.9 % IV BOLUS: 500.0000 mL | Freq: Once | INTRAVENOUS | Status: DC

## 2019-10-17 MED ORDER — CHLORHEXIDINE GLUCONATE CLOTH 2 % EX PADS
6.0000 | MEDICATED_PAD | Freq: Every day | CUTANEOUS | Status: DC
  Administered 2019-10-17: 6 via TOPICAL

## 2019-10-17 MED ORDER — INSULIN ASPART 100 UNIT/ML ~~LOC~~ SOLN
10.0000 [IU] | SUBCUTANEOUS | Status: DC | PRN
  Administered 2019-10-17: 10 [IU] via INTRAVENOUS

## 2019-10-17 MED ORDER — FENTANYL CITRATE (PF) 100 MCG/2ML IJ SOLN
INTRAMUSCULAR | Status: DC | PRN
  Administered 2019-10-17 (×2): 50 ug via INTRAVENOUS

## 2019-10-17 MED ORDER — HYDRALAZINE HCL 20 MG/ML IJ SOLN
10.0000 mg | Freq: Four times a day (QID) | INTRAMUSCULAR | Status: DC | PRN
  Administered 2019-10-17: 20 mg via INTRAVENOUS
  Filled 2019-10-17: qty 1

## 2019-10-17 NOTE — Progress Notes (Addendum)
Lab called with critical glucose level of 560. MD was paged and orders for insulin drip were given. ICU CN agreed to administer insulin.  Patient was given 15 units max dose on sliding scale and ICU CN administered additional 10 units per orders.  IV fluid bolus running and recheck of blood sugar upon completion of bolus.

## 2019-10-17 NOTE — Progress Notes (Signed)
After patient having discussion with his family, patient feels as though he should stay like MD McQuaid recommended. Paged MD McQuaid and he said he would place orders for patient to be admitted from endo dept.

## 2019-10-17 NOTE — Progress Notes (Signed)
ICU charge RN came to see pt at shift change. CBG rechecked agter bolus completed, 590. ICU RN paged on call, obtained verbal to transfer pt to ICU. Awaiting a bed in ICU.

## 2019-10-17 NOTE — Anesthesia Preprocedure Evaluation (Addendum)
Anesthesia Evaluation  Patient identified by [name], date of birth, ID band Patient awake    Reviewed: Allergy & Precautions, NPO status , Patient's Chart, lab work & pertinent test results  Airway Mallampati: II  TM Distance: >3 FB Neck ROM: Full    Dental  (+) Teeth Intact, Dental Advisory Given   Pulmonary sleep apnea , former smoker,     + decreased breath sounds(-) wheezing      Cardiovascular hypertension, Pt. on home beta blockers and Pt. on medications  Rhythm:Regular Rate:Normal     Neuro/Psych PSYCHIATRIC DISORDERS Depression  Neuromuscular disease    GI/Hepatic Neg liver ROS, GERD  Medicated,  Endo/Other  diabetes, Type 2, Insulin Dependent  Renal/GU CRFRenal disease     Musculoskeletal  (+) Arthritis ,   Abdominal Normal abdominal exam  (+)   Peds  Hematology negative hematology ROS (+)   Anesthesia Other Findings   Reproductive/Obstetrics                          Anesthesia Physical Anesthesia Plan  ASA: III  Anesthesia Plan: General   Post-op Pain Management:    Induction: Intravenous  PONV Risk Score and Plan: 3 and Ondansetron, Dexamethasone and Treatment may vary due to age or medical condition  Airway Management Planned: Oral ETT  Additional Equipment: None  Intra-op Plan:   Post-operative Plan: Extubation in OR  Informed Consent: I have reviewed the patients History and Physical, chart, labs and discussed the procedure including the risks, benefits and alternatives for the proposed anesthesia with the patient or authorized representative who has indicated his/her understanding and acceptance.     Dental advisory given  Plan Discussed with: CRNA  Anesthesia Plan Comments:        Anesthesia Quick Evaluation

## 2019-10-17 NOTE — Op Note (Signed)
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 [place] Bells, MD, Jeanella Cara, RN,                         Laverda Sorenson, Technician Referring MD:           Medicines:             General Anesthesia, None Complications:         No immediate complications Estimated Blood Loss:  Estimated blood loss was minimal. Procedure:      Pre-Anesthesia Assessment:      - A History and Physical has been performed. Patient meds and allergies       have been reviewed. The risks and benefits of the procedure and the       sedation options and risks were discussed with the patient. All       questions were answered and informed consent was obtained. Patient       identification and proposed procedure were verified prior to the       procedure by the physician and the nurse in the pre-procedure area.       Mental Status Examination: normal. Airway Examination: normal       oropharyngeal airway. Respiratory Examination: clear to auscultation. CV       Examination: normal. ASA Grade Assessment: II - A patient with mild       systemic disease. After reviewing the risks and benefits, the patient       was deemed in satisfactory condition to undergo the procedure. The       anesthesia plan was to use general anesthesia. Immediately prior to       administration of medications, the patient was re-assessed for adequacy       to receive sedatives. The heart rate, respiratory rate, oxygen       saturations, blood pressure, adequacy of pulmonary ventilation, and       response to  care were monitored throughout the procedure. The physical       status of the patient was re-assessed after the procedure.      After obtaining informed consent, the bronchoscope was passed under       direct vision. Throughout the procedure, the patient's blood pressure,       pulse, and oxygen saturations were monitored continuously. the [id number]       ([id number]) Olympus therapeutic bronchoscope was introduced through the       mouth, via the endotracheal tube and advanced to the tracheobronchial       tree. the BF-[id number] ([id number]) Olympus EBUS was introduced through the       and advanced to the. The procedure was accomplished without difficulty.       The patient tolerated the procedure well. The total duration of the       procedure  was 35 minutes. Findings:      The nasopharynx/oropharynx appears normal. The larynx appears normal.       The vocal cords appear normal. The subglottic space is normal. The       trachea is of normal caliber. The carina is sharp. The tracheobronchial       tree of the left lung was examined to at least the first subsegmental       level. Bronchial mucosa and anatomy in the left lung are normal; there       are no endobronchial lesions, and no secretions.      Right Lung Abnormalities: A partially obstructing mass was found in the       bronchus intermedius. The mass was large and endobronchial and friable.       The lesion was successfully traversed.      Transbronchial needle aspirations of a station 7 lymph node were       performed in the subcarinal area using a [employer] 22       gauge needle and sent for routine cytology. The procedure was guided by       ultrasound. Transbronchial needle aspiration technique was selected       because the sampling site was not visible endoscopically. The sampling       device penetrated the full thickness of the bronchial wall to obtain the       needle aspiration of lung tissue. 6 were obtained.  Stage N2.      An endobronchial ultrasound endoscope was utilized in order to assess       adenopathy in the right paratracheal area which was pathologically       enlarged, and it was used to inspect the bronchus intermedius mass. The       mass bled with contact from the ebus scope and was noted to be friable.       Cold saline was used to control the bleeding. Based on the amount of       bleeding from the mass from only endobronchial ultrasound inspection I       did not perform biopsies of the mass. 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      - An endobronchial and friable mass was found in the bronchus       intermedius. 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      - A transbronchial needle aspiration was performed.      - Endobronchial ultrasound was performed. Moderate Sedation:      General Anesthesia Recommendation:      - Await cytology results. Procedure Code(s):      --- Professional ---      [phone], Bronchoscopy, rigid or flexible, including fluoroscopic guidance,       when performed; with transbronchial needle aspiration biopsy(s),       trachea, main stem and/or lobar bronchus(i)      46659, Bronchoscopy, rigid or flexible, including fluoroscopic guidance,       when performed; with transendoscopic endobronchial ultrasound (EBUS)       during bronchoscopic diagnostic or therapeutic intervention(s) for       peripheral lesion(s) (List separately in addition to code for primary  procedure[s]) Diagnosis Code(s):      --- Professional ---      R04.2, Hemoptysis      R91.8, Other nonspecific abnormal finding of lung field      R59.0, Localized enlarged lymph nodes      J98.9, Respiratory disorder, unspecified CPT copyright 2019 American Medical Association. All rights reserved. The codes documented in this report are  preliminary and upon coder review may  be revised to meet current compliance requirements. Norlene Campbell, MD Juanito Doom, MD 10/17/2019 1:26:17 PM This report has been signed electronically. Number of Addenda: 0 Scope In: Scope Out:

## 2019-10-17 NOTE — Progress Notes (Signed)
Referral to rad onc completed per Dr. [name]

## 2019-10-17 NOTE — Progress Notes (Signed)
[facility]  After the procedure Mr. Balandran had about 30 minutes of dyspnea, cough and congestion with some hemoptysis.  He has improved significantly.  However based on this and his bronchus intermedius mass I recommended inpatient admission for expedited XRT.  He refused saying that he has to get home to take care of his mother with dementia.  I explained we would make effort to get him radiation therapy to the bronchus intermedius area ASAP.  Roselie Awkward, MD [place] [facility] Pager: [phone] Cell: [phone] If no response, call [phone]

## 2019-10-17 NOTE — Progress Notes (Signed)
Pt transferred via wheelchair to ICU.

## 2019-10-17 NOTE — H&P (Signed)
[facility] HPI  CC: chest pain HPI: pleasant 74 y/o smoker had a fall at home then a CXR due to left sided chest pain.  A right sided chest mass was discovered.  Of note he had been experiencing hemoptysis.  He was hospitalized and treated with nebulized TXA.  His hemoptysis resolved and he was discharged home.  He returns today for an EBUS bronchoscopy for biopsy.  Past Medical History:  Diagnosis Date  . Allergic rhinitis due to pollen   . Cancer ([place])   . Chronic kidney disease, stage III (moderate) 12/19/2012  . Depression   . Diabetes mellitus ([place])   . Diverticulosis   . Erectile dysfunction associated with type 2 diabetes mellitus ([place]) 05/28/2015  . Former very heavy cigarette smoker (more than 40 per day) 10/07/2014  . GERD (gastroesophageal reflux disease)   . Gout   . Hyperlipidemia   . Hypertension   . Hypogonadism male 06/14/2012  . Iron deficiency   . Memory loss   . Osteoarthritis   . Personal history of colonic polyps    1996     Family History  Problem Relation Age of Onset  . Diabetes Mother   . Alzheimer's disease Mother   . Diabetes Father   . Lung cancer Father        Age 53  . Stroke Father   . Heart attack Father   . Lung cancer Sister        lung  . Heart disease Maternal Grandfather   . COPD Sister   . Heart attack Sister   . Heart disease Sister      Social History   Socioeconomic History  . Marital status: Widowed    Spouse name: Not on file  . Number of children: 4  . Years of education: 12th  . Highest education level: Not on file  Occupational History  . Occupation: Counsellor  . Occupation: retired Magazine features editor: [employer]  Tobacco Use  . Smoking status: Former Smoker    Packs/day: 3.00    Years: 35.00    Pack years: 105.00    Types: Cigarettes    Quit date: 04/12/2000    Years since quitting: 19.5  . Smokeless tobacco: Never Used  Vaping Use  . Vaping Use: Never used  Substance and Sexual Activity  . Alcohol  use: No  . Drug use: No  . Sexual activity: Never  Other Topics Concern  . Not on file  Social History Narrative   No regular exercise   Caffeine use: yes, dt coke   Lives at home with his mother lives with him.   Wife Fraser Din recently deceased   Right-handed.   Social Determinants of Health   Financial Resource Strain: Low Risk   . Difficulty of Paying Living Expenses: Not hard at all  Food Insecurity: No Food Insecurity  . Worried About Charity fundraiser in the Last Year: Never true  . Ran Out of Food in the Last Year: Never true  Transportation Needs: No Transportation Needs  . Lack of Transportation (Medical): No  . Lack of Transportation (Non-Medical): No  Physical Activity: Inactive  . Days of Exercise per Week: 0 days  . Minutes of Exercise per Session: 0 min  Stress: No Stress Concern Present  . Feeling of Stress : Not at all  Social Connections:   . Frequency of Communication with Friends and Family:   . Frequency of Social Gatherings with Friends  and Family:   . Attends Religious Services:   . Active Member of Clubs or Organizations:   . Attends Archivist Meetings:   [name] Kitchen Marital Status:   Intimate Partner Violence: Not At Risk  . Fear of Current or Ex-Partner: No  . Emotionally Abused: No  . Physically Abused: No  . Sexually Abused: No     Allergies  Allergen Reactions  . Tetracycline Itching and Rash     @encmedstart @  Vitals:   10/14/19 0835 10/16/19 0923 10/17/19 1115  BP: 123/88  (!) 187/91  Pulse: 81  (!) 103  Resp: 18  20  Temp: 98 F (36.7 C)  97.9 F (36.6 C)  TempSrc:   Oral  SpO2: 98%  98%  Weight:  90.7 kg 90.7 kg  Height:  5' 11.75" (1.822 m) 5\' 11"  (1.803 m)   RA  General:  Resting comfortably in bed HENT: NCAT OP clear PULM: CTA B, normal effort CV: RRR, no mgr GI: BS+, soft, nontender MSK: normal bulk and tone Neuro: awake, alert, no distress, MAEW  CBC    Component Value Date/Time   WBC 7.8 10/14/2019 0514    RBC 3.47 (L) 10/14/2019 0514   HGB 9.6 (L) 10/14/2019 0514   HGB 12.8 (L) 11/16/2011 0513   HCT 29.6 (L) 10/14/2019 0514   HCT 36.1 (L) 11/16/2011 0513   PLT 200 10/14/2019 0514   PLT 205 11/16/2011 0513   MCV 85.3 10/14/2019 0514   MCV 90 11/16/2011 0513   MCH 27.7 10/14/2019 0514   MCHC 32.4 10/14/2019 0514   RDW 13.0 10/14/2019 0514   RDW 13.1 11/16/2011 0513   LYMPHSABS 1.4 10/14/2019 0514   LYMPHSABS 1.8 11/16/2011 0513   MONOABS 1.0 10/14/2019 0514   MONOABS 0.5 11/16/2011 0513   EOSABS 0.1 10/14/2019 0514   EOSABS 0.1 11/16/2011 0513   BASOSABS 0.0 10/14/2019 0514   BASOSABS 0.0 11/16/2011 0513   BMET    Component Value Date/Time   NA 133 (L) 10/14/2019 0514   NA 130 (L) 11/25/2011 1119   NA 137 11/16/2011 0513   K 3.7 10/14/2019 0514   K 4.1 11/16/2011 0513   CL 99 10/14/2019 0514   CL 100 11/16/2011 0513   CO2 25 10/14/2019 0514   CO2 29 11/16/2011 0513   GLUCOSE 208 (H) 10/14/2019 0514   GLUCOSE 239 (H) 11/16/2011 0513   BUN 28 (H) 10/14/2019 0514   BUN 25 11/25/2011 1119   BUN 15 11/16/2011 0513   CREATININE 2.08 (H) 10/14/2019 0514   CREATININE 1.26 11/16/2011 0513   CALCIUM 8.2 (L) 10/14/2019 0514   CALCIUM 9.1 11/16/2011 0513   GFRNONAA 31 (L) 10/14/2019 0514   GFRNONAA 59 (L) 11/16/2011 0513   GFRAA 36 (L) 10/14/2019 0514   GFRAA >60 11/16/2011 0513    CT chest images personally reviewed: large R hilar mass infiltrating the RUL and RLL, studding of pleura RLL, also cavitary nodule RLL, 4R lymph node and station 7 lymph node noted  Impression/plan: Lung mass with mediastinal adenopathy: plan EBUS bronchoscopy for airway exam, FNA lymph nodes and likely endobronchial biopsy of the right upper lobe; needs PET CT outpatient oncology follow up; risks and benefits explained to patient, he is willing to proceed  Roselie Awkward, MD [place] [facility] Pager: [phone] Cell: [phone] If no response, call [phone]

## 2019-10-17 NOTE — Transfer of Care (Signed)
Immediate Anesthesia Transfer of Care Note  Patient: [name]  Procedure(s) Performed: ENDOBRONCHIAL ULTRASOUND (Bilateral ) BRONCHIAL NEEDLE ASPIRATION BIOPSIES HEMOSTASIS CONTROL  Patient Location: Endoscopy Unit  Anesthesia Type:General  Level of Consciousness: awake, oriented, patient cooperative and responds to stimulation  Airway & Oxygen Therapy: Patient Spontanous Breathing and Patient connected to face mask oxygen  Post-op Assessment: Report given to RN and Post -op Vital signs reviewed and stable  Post vital signs: Reviewed and stable  Last Vitals:  Vitals Value Taken Time  BP 138/106 10/17/19 1325  Temp    Pulse 102 10/17/19 1326  Resp 20 10/17/19 1326  SpO2 98 % 10/17/19 1326  Vitals shown include unvalidated device data.  Last Pain:  Vitals:   10/17/19 1115  TempSrc: Oral  PainSc: 4       Patients Stated Pain Goal: 3 (27/63/94 3200)  Complications: No complications documented.

## 2019-10-17 NOTE — Progress Notes (Signed)
[facility] Progress Note Patient Name: ESTANISLAO HARMON DOB: March 29, 1946 [name]: [id number]   Date of Service  10/17/2019  HPI/Events of Note  Patient needs a PRN for blood pressure control.  eICU Interventions  Hydralazine 10-20 mg iv Q 6 hours prn SBP > 170 mmHg ordered.        Kerry Kass Frimet Durfee 10/17/2019, 10:18 PM

## 2019-10-17 NOTE — Anesthesia Procedure Notes (Signed)
Date/Time: 10/17/2019 12:35 PM Performed by: Victoriano Lain, CRNA Pre-anesthesia Checklist: Patient identified, Emergency Drugs available, Suction available, Patient being monitored and Timeout performed Patient Re-evaluated:Patient Re-evaluated prior to induction Oxygen Delivery Method: Circle system utilized Preoxygenation: Pre-oxygenation with 100% oxygen Induction Type: IV induction Ventilation: Mask ventilation without difficulty Laryngoscope Size: Mac and 4 Grade View: Grade I Tube type: Oral Tube size: 9.0 mm Number of attempts: 1 Airway Equipment and Method: Stylet Placement Confirmation: ETT inserted through vocal cords under direct vision,  positive ETCO2 and breath sounds checked- equal and bilateral Secured at: 22 cm Tube secured with: Tape Dental Injury: Teeth and Oropharynx as per pre-operative assessment

## 2019-10-17 NOTE — Discharge Instructions (Signed)
Flexible Bronchoscopy, Care After This sheet gives you information about how to care for yourself after your procedure. Your health care provider may also give you more specific instructions. If you have problems or questions, contact your health care provider. What can I expect after the procedure? After the procedure, it is common to have the following symptoms for 24-48 hours:  A cough that is worse than it was before the procedure.  A low-grade fever.  A sore throat or hoarse voice.  Small streaks of blood in the mucus from your lungs (sputum), if tissue samples were removed (biopsy). Follow these instructions at home: Eating and drinking  Do not eat or drink anything (including water) for 2 hours after your procedure, or until your numbing medicine (local anesthetic) has worn off. Having a numb throat increases your risk of burning yourself or choking.  After your numbness is gone and your cough and gag reflexes have returned, you may start eating only soft foods and slowly drinking liquids.  The day after the procedure, return to your normal diet. Driving  Do not drive for 24 hours if you were given a medicine to help you relax (sedative).  Do not drive or use heavy machinery while taking prescription pain medicine. General instructions   Take over-the-counter and prescription medicines only as told by your health care provider.  Return to your normal activities as told by your health care provider. Ask your health care provider what activities are safe for you.  Do not use any products that contain nicotine or tobacco, such as cigarettes and e-cigarettes. If you need help quitting, ask your health care provider.  Keep all follow-up visits as told by your health care provider. This is important, especially if you had a biopsy taken. Get help right away if:  You have shortness of breath that gets worse.  You become light-headed or feel like you might faint.  You have  chest pain.  You cough up more than a small amount of blood.  The amount of blood you cough up increases. Summary  Common symptoms in the 24-48 hours following a flexible bronchoscopy include cough, low-grade fever, sore throat or hoarse voice, and blood-streaked mucus from the lungs (if you had a biopsy).  Do not eat or drink anything (including water) for 2 hours after your procedure, or until your local anesthetic has worn off. You can return to your normal diet the day after the procedure.  Get help right away if you develop worsening shortness of breath, have chest pain, become light-headed, or cough up more than a small amount of blood. This information is not intended to replace advice given to you by your health care provider. Make sure you discuss any questions you have with your health care provider. Document Revised: 03/11/2017 Document Reviewed: 04/16/2016 Elsevier Patient Education  2020 [employer].

## 2019-10-17 NOTE — Progress Notes (Addendum)
[facility] Progress Note Patient Name: [name] DOB: [birth year] [name]: [id number]   Date of Service  10/17/2019  HPI/Events of Note  Blood sugar 590 mg % despite 10  units of iv insulin and sq sliding scale insulin regimen with CBG monitoring of blood sugar. Pt  Originally admitted to the hospital post-bronchoscopy for bleeding, obstructive  endobronchial lesion that requires radiation therapy.  eICU Interventions  Transfer to ICU to facilitate starting an insulin infusion to obtain glycemic control,  prior to reverting to Sq insulin. New patient evaluation completed.        Frederik Pear 10/17/2019, 8:43 PM

## 2019-10-17 NOTE — Anesthesia Postprocedure Evaluation (Signed)
Anesthesia Post Note  Patient: ZAYED GRIFFIE  Procedure(s) Performed: ENDOBRONCHIAL ULTRASOUND (Bilateral ) BRONCHIAL NEEDLE ASPIRATION BIOPSIES HEMOSTASIS CONTROL     Patient location during evaluation: PACU Anesthesia Type: General Level of consciousness: awake and alert Pain management: pain level controlled Vital Signs Assessment: post-procedure vital signs reviewed and stable Respiratory status: spontaneous breathing, nonlabored ventilation, respiratory function stable and patient connected to nasal cannula oxygen Cardiovascular status: blood pressure returned to baseline and stable Postop Assessment: no apparent nausea or vomiting Anesthetic complications: no   No complications documented.  Last Vitals:  Vitals:   10/17/19 1410 10/17/19 1420  BP: (!) 141/66 129/65  Pulse: (!) 102 (!) 107  Resp: (!) 28 19  Temp:    SpO2: 91% 95%    Last Pain:  Vitals:   10/17/19 1420  TempSrc:   PainSc: 0-No pain                 Effie Berkshire

## 2019-10-17 NOTE — H&P (Signed)
NAME:  [name], [name]:  [id number], DOB:  04/07/1946, LOS: 0 ADMISSION DATE:  10/17/2019, CONSULTATION DATE: July 7 REFERRING MD: None, CHIEF COMPLAINT: Shortness of breath, hemoptysis  Brief History   74 year old male with newly discovered right hilar mass and mediastinal adenopathy being admitted from the endoscopy suite after bronchoscopy revealed critical obstruction of the bronchus intermedius.  History of present illness   This is a pleasant 74 year old male who was just admitted to our facility a few days ago in the setting of hemoptysis.  He was discharged home after treatment with tranexamic acid and improvement in hemoptysis.  At home he felt fine, still coughing up about a teaspoon of blood a day.  He returned today for an elective bronchoscopy.  The procedure went well without complication though it was noted that he had significant obstruction of his bronchus intermedius.  After the procedure he had some shortness of breath and hemoptysis which took about 30 minutes to resolve.  He is being admitted now to expedite radiation therapy to the bronchus intermedius to hopefully prevent complete occlusion of the right lower lobe.  Past Medical History  Allergic rhinitis Non-small cell lung cancer diagnosed October 17, 2019 Diabetes mellitus Diverticulosis Erectile dysfunction Former heavy cigarette smoker GERD Gout Hyperlipidemia Hypertension Iron deficiency anemia Osteoarthritis  Significant Hospital Events   July 7 endobronchial ultrasound diagnostic of non-small cell lung cancer, admission  Consults:  Radiation oncology  Procedures:    Significant Diagnostic Tests:  July 7 endobronchial ultrasound showed large mass in the bronchus intermedius nearly completely occluding the airway, bulky mediastinal adenopathy, station 7 tissue positive for non-small cell lung cancer  Micro Data:    Antimicrobials:     Interim history/subjective:    Objective   Blood pressure  129/65, pulse (!) 107, temperature 98 F (36.7 C), temperature source Axillary, resp. rate 19, height 5\' 11"  (1.803 m), weight 90.7 kg, SpO2 95 %.        Intake/Output Summary (Last 24 hours) at 10/17/2019 1527 Last data filed at 10/17/2019 1327 Gross per 24 hour  Intake 850 ml  Output 20 ml  Net 830 ml   Filed Weights   10/16/19 0923 10/17/19 1115  Weight: 90.7 kg 90.7 kg    Examination:  General:  Resting comfortably in bed HENT: NCAT OP clear PULM: Diminished R base B, normal effort CV: RRR, no mgr GI: BS+, soft, nontender MSK: normal bulk and tone Neuro: awake, alert, no distress, MAEW  Resolved Hospital Problem list     Assessment & Plan:  Near complete airway obstruction from non-small cell lung cancer Hemoptysis Shortness of breath secondary to non-small cell lung cancer Admit to medical surge unit Stat radiation oncology consult for consideration of radiation therapy to the bronchus intermedius mass Tranexamic acid inhaled now Monitor O2 saturation  DM2 SSI  Dementia aricept  Hypertenison Hold lisinopril for now  GERD omeprazole  Check admit labs  Best practice:  Diet: regular Pain/Anxiety/Delirium protocol (if indicated): n/a VAP protocol (if indicated): n/a DVT prophylaxis: SCD GI prophylaxis: omeprazole Glucose control: SSI Mobility: out of bed as tolerated Code Status: full Family Communication: none bedside Disposition: admit  Labs   CBC: Recent Labs  Lab 10/12/19 1727 10/13/19 1412 10/14/19 0514  WBC 6.1  --  7.8  NEUTROABS 4.4  --  5.3  HGB 12.5* 10.3* 9.6*  HCT 38.3* 31.9* 29.6*  MCV 84.5  --  85.3  PLT 254  --  347    Basic Metabolic  Panel: Recent Labs  Lab 10/12/19 1727 10/13/19 0435 10/14/19 0514  NA 132* 134* 133*  K 4.0 3.8 3.7  CL 91* 96* 99  CO2 27 29 25   GLUCOSE 424* 327* 208*  BUN 21 19 28*  CREATININE 1.44* 1.43* 2.08*  CALCIUM 8.7* 8.7* 8.2*   GFR: Estimated Creatinine Clearance: 36.5 mL/min (A) (by  C-G formula based on SCr of 2.08 mg/dL (H)). Recent Labs  Lab 10/12/19 1727 10/14/19 0514  WBC 6.1 7.8    Liver Function Tests: No results for input(s): AST, ALT, ALKPHOS, BILITOT, PROT, ALBUMIN in the last 168 hours. No results for input(s): LIPASE, AMYLASE in the last 168 hours. No results for input(s): AMMONIA in the last 168 hours.  ABG    Component Value Date/Time   HCO3 16.7 (L) 08/04/2018 1448   TCO2 18 (L) 08/04/2018 1448   ACIDBASEDEF 11.0 (H) 08/04/2018 1448   O2SAT 63.0 08/04/2018 1448     Coagulation Profile: Recent Labs  Lab 10/13/19 0435  INR 1.0    Cardiac Enzymes: No results for input(s): CKTOTAL, CKMB, CKMBINDEX, TROPONINI in the last 168 hours.  HbA1C: Hemoglobin A1C  Date/Time Value Ref Range Status  09/17/2019 01:33 PM 13.0 (A) 4.0 - 5.6 % Final   Hgb A1c MFr Bld  Date/Time Value Ref Range Status  10/10/2019 12:21 PM 10.6 (H) 4.6 - 6.5 % Final    Comment:    Glycemic Control Guidelines for People with Diabetes:Non Diabetic:  <6%Goal of Therapy: <7%Additional Action Suggested:  >8%   04/07/2019 04:32 AM 12.0 (H) 4.8 - 5.6 % Final    Comment:    (NOTE) Pre diabetes:          5.7%-6.4% Diabetes:              >6.4% Glycemic control for   <7.0% adults with diabetes     CBG: Recent Labs  Lab 10/13/19 0717 10/13/19 1127 10/13/19 1640 10/13/19 2058 10/14/19 0733  GLUCAP 352* 324* 150* 138* 198*    Review of Systems:   Gen: Denies fever, chills, weight change, fatigue, night sweats HEENT: Denies blurred vision, double vision, hearing loss, tinnitus, sinus congestion, rhinorrhea, sore throat, neck stiffness, dysphagia PULM: per HPI CV: Denies chest pain, edema, orthopnea, paroxysmal nocturnal dyspnea, palpitations GI: Denies abdominal pain, nausea, vomiting, diarrhea, hematochezia, melena, constipation, change in bowel habits GU: Denies dysuria, hematuria, polyuria, oliguria, urethral discharge Endocrine: Denies hot or cold intolerance,  polyuria, polyphagia or appetite change Derm: Denies rash, dry skin, scaling or peeling skin change Heme: Denies easy bruising, bleeding, bleeding gums Neuro: Denies headache, numbness, weakness, slurred speech, loss of memory or consciousness   Past Medical History  He,  has a past medical history of Allergic rhinitis due to pollen, Cancer ([place]), Chronic kidney disease, stage III (moderate) (12/19/2012), Depression, Diabetes mellitus ([place]), Diverticulosis, Erectile dysfunction associated with type 2 diabetes mellitus ([place]) (05/28/2015), Former very heavy cigarette smoker (more than 40 per day) (10/07/2014), GERD (gastroesophageal reflux disease), Gout, Hyperlipidemia, Hypertension, Hypogonadism male (06/14/2012), Iron deficiency, Memory loss, Osteoarthritis, and Personal history of colonic polyps.   Surgical History    Past Surgical History:  Procedure Laterality Date  . CARDIAC CATHETERIZATION  11/2011   [facility]  . CHOLECYSTECTOMY  1980  . ESOPHAGOGASTRODUODENOSCOPY (EGD) WITH PROPOFOL N/A 09/27/2017   Procedure: ESOPHAGOGASTRODUODENOSCOPY (EGD) WITH PROPOFOL;  Surgeon: Lin Landsman, MD;  Location: [facility];  Service: Gastroenterology;  Laterality: N/A;  . EYE SURGERY Right 07/29/2016   cataract - Dr. Manuella Ghazi  .  EYE SURGERY Left 08/23/2106   cataract - Dr. Manuella Ghazi  . TOTAL HIP ARTHROPLASTY  2009   Dr. Gladstone Lighter  . TOTAL HIP ARTHROPLASTY       Social History   reports that he quit smoking about 19 years ago. His smoking use included cigarettes. He has a 105.00 pack-year smoking history. He has never used smokeless tobacco. He reports that he does not drink alcohol and does not use drugs.   Family History   His family history includes Alzheimer's disease in his mother; COPD in his sister; Diabetes in his father and mother; Heart attack in his father and sister; Heart disease in his maternal grandfather and sister; Lung cancer in his father and sister; Stroke in his father.    Allergies Allergies  Allergen Reactions  . Tetracycline Itching and Rash     Home Medications  Prior to Admission medications   Medication Sig Start Date End Date Taking? Authorizing Provider  acetaminophen (QC ACETAMINOPHEN 8HR ARTH PAIN) 650 MG CR tablet Take 650 mg by mouth every 8 (eight) hours as needed for pain.   Yes [provider]  Ascorbic Acid (VITAMIN C) 1000 MG tablet Take 1,000 mg by mouth daily.   Yes [provider]  atenolol (TENORMIN) 50 MG tablet TAKE 1 TABLET BY MOUTH EVERY DAY Patient taking differently: Take 50 mg by mouth daily.  08/09/19  Yes Copland, Frederico Hamman, MD  cholecalciferol (VITAMIN D) 1000 UNITS tablet Take 1,000 Units by mouth daily.     Yes [provider]  donepezil (ARICEPT) 10 MG tablet TAKE 1 TABLET BY MOUTH AT BEDTIME Patient taking differently: Take 10 mg by mouth at bedtime.  08/24/19  Yes Copland, Frederico Hamman, MD  doxazosin (CARDURA) 8 MG tablet TAKE ONE-HALF TABLET AT BEDTIME Patient taking differently: Take 4 mg by mouth daily.  01/26/19  Yes Copland, Frederico Hamman, MD  fluticasone (FLONASE) 50 MCG/ACT nasal spray PLACE 2 SPRAYS IN EACH NOSTRIL DAILY Patient taking differently: Place 2 sprays into both nostrils daily as needed for allergies or rhinitis.  01/26/19  Yes Copland, Frederico Hamman, MD  guaiFENesin-dextromethorphan (ROBITUSSIN DM) 100-10 MG/5ML syrup Take 5 mLs by mouth every 6 (six) hours as needed for cough. 10/14/19  Yes Arrien, Jimmy Picket, MD  Hyprom-Naphaz-Polysorb-Zn Sulf (CLEAR EYES COMPLETE OP) Place 1 drop into both eyes daily as needed (dry eyes).   Yes [provider]  insulin regular human CONCENTRATED (HUMULIN R U-500 KWIKPEN) 500 UNIT/ML kwikpen Inject under skin 80-90 units before b'fast, 60 units before lunch, 80-90 units vefore dinner Patient taking differently: Inject 20-40 Units into the skin See admin instructions. Takes 35-40 units before breakfast, 20 units at lunch and 35-40 units at night  09/17/19  Yes Philemon Kingdom, MD  lisinopril (ZESTRIL) 10 MG tablet TAKE 1 TABLET BY MOUTH EVERY DAY Patient taking differently: Take 10 mg by mouth daily.  01/26/19  Yes Copland, Frederico Hamman, MD  memantine (NAMENDA) 10 MG tablet TAKE 1 TABLET BY MOUTH TWICE A DAY Patient taking differently: Take 10 mg by mouth 2 (two) times daily.  09/20/19  Yes Copland, Frederico Hamman, MD  montelukast (SINGULAIR) 10 MG tablet Take 1 tablet (10 mg total) by mouth at bedtime. 09/20/19  Yes Bedsole, Amy E, MD  nystatin (MYCOSTATIN) 100000 UNIT/ML suspension Take 5 mLs (500,000 Units total) by mouth 4 (four) times daily for 14 days. 10/14/19 10/28/19 Yes Arrien, Jimmy Picket, MD  omeprazole (PRILOSEC) 40 MG capsule Take 1 capsule (40 mg total) by mouth 2 (two) times daily before  a meal. Patient taking differently: Take 40 mg by mouth daily.  08/21/18 04/06/28 Yes Copland, Frederico Hamman, MD  simvastatin (ZOCOR) 40 MG tablet TAKE 1 TABLET BY MOUTH AT BEDTIME Patient taking differently: Take 40 mg by mouth daily.  04/30/19  Yes Copland, Frederico Hamman, MD  Testosterone Cypionate 150 MG/ML SOLN Inject 1 mL into the muscle every 14 (fourteen) days. 09/07/19  Yes Copland, Frederico Hamman, MD  venlafaxine XR (EFFEXOR-XR) 75 MG 24 hr capsule TAKE 1 CAPSULE BY MOUTH EVERY MORNING AND 2 CAPSULES AT BEDTIME Patient taking differently: Take 75 mg by mouth See admin instructions. Takes 1 capsule by mouth in the morning and 2 capsules at night. 01/27/19  Yes Copland, Frederico Hamman, MD  Insulin Pen Needle (PEN NEEDLES) 31G X 5 MM MISC 1 each by Does not apply route 3 (three) times daily. To inject insulin E11.42 09/20/19   Philemon Kingdom, MD     Critical care time: n/a    Roselie Awkward, MD [place] [facility] Pager: [phone] Cell: [phone] If no response, call [phone]

## 2019-10-18 ENCOUNTER — Ambulatory Visit
Admit: 2019-10-18 | Discharge: 2019-10-18 | Disposition: A | Discharge status: Home / Self Care | Payer: Medicare Other | Source: Ambulatory Visit | Attending: Radiation Oncology | Admitting: Radiation Oncology

## 2019-10-18 ENCOUNTER — Encounter (HOSPITAL_COMMUNITY): Admission: RE | Admit: 2019-10-18 | Payer: Medicare Other | Source: Ambulatory Visit

## 2019-10-18 ENCOUNTER — Ambulatory Visit
Admit: 2019-10-18 | Discharge: 2019-10-18 | Disposition: A | Discharge status: Home / Self Care | Payer: Medicare Other | Attending: Radiation Oncology | Admitting: Radiation Oncology

## 2019-10-18 ENCOUNTER — Ambulatory Visit
Admission: RE | Admit: 2019-10-18 | Discharge: 2019-10-18 | Disposition: A | Discharge status: Home / Self Care | Payer: Medicare Other | Source: Ambulatory Visit | Attending: Radiation Oncology | Admitting: Radiation Oncology

## 2019-10-18 ENCOUNTER — Other Ambulatory Visit: Payer: Self-pay | Admitting: *Deleted

## 2019-10-18 DIAGNOSIS — Z79899 Other long term (current) drug therapy: Secondary | ICD-10-CM | POA: Diagnosis not present

## 2019-10-18 DIAGNOSIS — C3431 Malignant neoplasm of lower lobe, right bronchus or lung: Secondary | ICD-10-CM | POA: Diagnosis not present

## 2019-10-18 DIAGNOSIS — C3490 Malignant neoplasm of unspecified part of unspecified bronchus or lung: Secondary | ICD-10-CM | POA: Diagnosis not present

## 2019-10-18 DIAGNOSIS — Z87891 Personal history of nicotine dependence: Secondary | ICD-10-CM | POA: Diagnosis not present

## 2019-10-18 DIAGNOSIS — N183 Chronic kidney disease, stage 3 unspecified: Secondary | ICD-10-CM | POA: Diagnosis not present

## 2019-10-18 DIAGNOSIS — C3411 Malignant neoplasm of upper lobe, right bronchus or lung: Secondary | ICD-10-CM | POA: Insufficient documentation

## 2019-10-18 DIAGNOSIS — R042 Hemoptysis: Secondary | ICD-10-CM | POA: Diagnosis not present

## 2019-10-18 DIAGNOSIS — R918 Other nonspecific abnormal finding of lung field: Secondary | ICD-10-CM

## 2019-10-18 DIAGNOSIS — I129 Hypertensive chronic kidney disease with stage 1 through stage 4 chronic kidney disease, or unspecified chronic kidney disease: Secondary | ICD-10-CM | POA: Diagnosis not present

## 2019-10-18 DIAGNOSIS — Z510 Encounter for antineoplastic radiation therapy: Secondary | ICD-10-CM | POA: Insufficient documentation

## 2019-10-18 DIAGNOSIS — E1165 Type 2 diabetes mellitus with hyperglycemia: Secondary | ICD-10-CM | POA: Diagnosis not present

## 2019-10-18 DIAGNOSIS — Z794 Long term (current) use of insulin: Secondary | ICD-10-CM | POA: Diagnosis not present

## 2019-10-18 DIAGNOSIS — E1122 Type 2 diabetes mellitus with diabetic chronic kidney disease: Secondary | ICD-10-CM | POA: Diagnosis not present

## 2019-10-18 LAB — GLUCOSE, CAPILLARY
Glucose-Capillary: 155 mg/dL — ABNORMAL HIGH (ref 70–99)
Glucose-Capillary: 161 mg/dL — ABNORMAL HIGH (ref 70–99)
Glucose-Capillary: 162 mg/dL — ABNORMAL HIGH (ref 70–99)
Glucose-Capillary: 175 mg/dL — ABNORMAL HIGH (ref 70–99)
Glucose-Capillary: 178 mg/dL — ABNORMAL HIGH (ref 70–99)
Glucose-Capillary: 182 mg/dL — ABNORMAL HIGH (ref 70–99)
Glucose-Capillary: 191 mg/dL — ABNORMAL HIGH (ref 70–99)
Glucose-Capillary: 200 mg/dL — ABNORMAL HIGH (ref 70–99)
Glucose-Capillary: 205 mg/dL — ABNORMAL HIGH (ref 70–99)
Glucose-Capillary: 207 mg/dL — ABNORMAL HIGH (ref 70–99)

## 2019-10-18 LAB — CBC
HCT: 30.7 % — ABNORMAL LOW (ref 39.0–52.0)
Hemoglobin: 10.2 g/dL — ABNORMAL LOW (ref 13.0–17.0)
MCH: 27.9 pg (ref 26.0–34.0)
MCHC: 33.2 g/dL (ref 30.0–36.0)
MCV: 83.9 fL (ref 80.0–100.0)
Platelets: 354 10*3/uL (ref 150–400)
RBC: 3.66 MIL/uL — ABNORMAL LOW (ref 4.22–5.81)
RDW: 12.8 % (ref 11.5–15.5)
WBC: 7.8 10*3/uL (ref 4.0–10.5)
nRBC: 0 % (ref 0.0–0.2)

## 2019-10-18 LAB — BASIC METABOLIC PANEL
Anion gap: 12 (ref 5–15)
Anion gap: 12 (ref 5–15)
BUN: 24 mg/dL — ABNORMAL HIGH (ref 8–23)
BUN: 24 mg/dL — ABNORMAL HIGH (ref 8–23)
CO2: 25 mmol/L (ref 22–32)
CO2: 26 mmol/L (ref 22–32)
Calcium: 8.9 mg/dL (ref 8.9–10.3)
Calcium: 9.1 mg/dL (ref 8.9–10.3)
Chloride: 97 mmol/L — ABNORMAL LOW (ref 98–111)
Chloride: 97 mmol/L — ABNORMAL LOW (ref 98–111)
Creatinine, Ser: 1.46 mg/dL — ABNORMAL HIGH (ref 0.61–1.24)
Creatinine, Ser: 1.54 mg/dL — ABNORMAL HIGH (ref 0.61–1.24)
GFR calc Af Amer: 55 mL/min — ABNORMAL LOW (ref >60)
GFR calc Af Amer: 51 mL/min — ABNORMAL LOW (ref >60)
GFR calc non Af Amer: 47 mL/min — ABNORMAL LOW (ref >60)
GFR calc non Af Amer: 44 mL/min — ABNORMAL LOW (ref >60)
Glucose, Bld: 209 mg/dL — ABNORMAL HIGH (ref 70–99)
Glucose, Bld: 172 mg/dL — ABNORMAL HIGH (ref 70–99)
Potassium: 4.4 mmol/L (ref 3.5–5.1)
Potassium: 3.8 mmol/L (ref 3.5–5.1)
Sodium: 134 mmol/L — ABNORMAL LOW (ref 135–145)
Sodium: 135 mmol/L (ref 135–145)

## 2019-10-18 MED ORDER — INSULIN ASPART 100 UNIT/ML ~~LOC~~ SOLN
2.0000 [IU] | SUBCUTANEOUS | Status: DC
  Administered 2019-10-18 (×2): 4 [IU] via SUBCUTANEOUS

## 2019-10-18 MED ORDER — INSULIN DETEMIR 100 UNIT/ML ~~LOC~~ SOLN
18.0000 [IU] | Freq: Two times a day (BID) | SUBCUTANEOUS | Status: DC
  Administered 2019-10-18: 18 [IU] via SUBCUTANEOUS
  Filled 2019-10-18 (×2): qty 0.18

## 2019-10-18 NOTE — Progress Notes (Signed)
eLink Physician-Brief Progress Note Patient Name: [name] DOB: [birth year] [name]: [id number]   Date of Service  10/18/2019  HPI/Events of Note  Patient needs transition hyperglycemia orders.  eICU Interventions  Transition orders entered.        [name] 10/18/2019, 6:10 AM

## 2019-10-18 NOTE — Care Management Obs Status (Signed)
MEDICARE OBSERVATION STATUS NOTIFICATION   Patient Details  Name: [name] [name]: [id number] Date of Birth: [birth year]   Medicare Observation Status Notification Given:       Leeroy Cha, RN 10/18/2019, 3:54 PM

## 2019-10-18 NOTE — Progress Notes (Signed)
I received a consult request about this inpatient with a friable, bleeding tumor within the proximal right lung.  The patient appears to be a good candidate for palliative radiation treatment.  I have talked to our staff about bringing the patient down to our department today for treatment planning and for further discussion.  We could begin radiation treatment potentially as soon as later today.  I would tentatively plan for a palliative 3-week course of radiation treatment and this plan could be further altered depending on additional work-up.  Unfortunately, the patient's outpatient PET scan was canceled and will need to be rescheduled.  ------------------------------------------------  Jodelle Gross, MD, PhD

## 2019-10-18 NOTE — Discharge Summary (Signed)
Physician Discharge Summary         Patient ID: [name] [name]: [id number] DOB/AGE: [birth year] 74 y.o.  Admit date: 10/17/2019 Discharge date: 10/18/2019  Discharge Diagnoses:   Non-small cell lung cancer Hemoptysis Hyperglycemia Type 2 diabetes  Discharge summary   This is a pleasant 74 year old male who was recently hospitalized for hemoptysis, had been noted just prior to admission to have a right hilar mass.  He was brought in for an elective endobronchial ultrasound as well as routine bronchoscopy for diagnostic purposes.  During that procedure was noted that he had critical obstruction of his bronchus intermedius so he was admitted to facilitate rapid radiation therapy treatment.  After admission he was noted to be profoundly hyperglycemic.  The patient says that he did not take insulin on the day of the procedure so he thinks that this combined with stress led to hyperglycemia.  He required admission to the stepdown unit to receive IV continuous insulin therapy.  With this his hyperglycemia normalized.  Radiation oncology saw the patient in consultation during the hospitalization and began treatment mapping on July 8.  He is scheduled for palliative radiation therapy over the next several weeks.   Discharge Plan by Active Problems   #1 non-small cell lung cancer with critical obstruction of the bronchus intermedius: Radiation oncology to continue outpatient treatment, PET scan scheduled for next week, outpatient oncology scheduled for next week per oncology navigator # 2.  Hyperglycemia with diabetes mellitus type 2 at baseline: Resume home regimen of IV insulin    Significant Hospital tests/ studies   Procedures   Bronchoscopy with endobronchial ultrasound: Critical obstruction of bronchus intermedius by lung mass, fine-needle aspiration of station 7 lymph nodes showed non-small cell lung cancer Culture data/antimicrobials      Consults   Radiation  oncology   Discharge Exam: BP (!) 141/65 (BP Location: Right Arm)   Pulse 78   Temp 97.7 F (36.5 C) (Oral)   Resp 11   Ht 5\' 11"  (1.803 m)   Wt 90.1 kg   SpO2 100%   BMI 27.70 kg/m   General:  Resting comfortably in bed HENT: NCAT OP clear PULM: diminished right base B, normal effort CV: RRR, no mgr GI: BS+, soft, nontender MSK: normal bulk and tone Neuro: awake, alert, no distress, MAEW  Labs at discharge   Lab Results  Component Value Date   CREATININE 1.54 (H) 10/18/2019   BUN 24 (H) 10/18/2019   NA 135 10/18/2019   K 3.8 10/18/2019   CL 97 (L) 10/18/2019   CO2 26 10/18/2019   Lab Results  Component Value Date   WBC 7.8 10/18/2019   HGB 10.2 (L) 10/18/2019   HCT 30.7 (L) 10/18/2019   MCV 83.9 10/18/2019   PLT 354 10/18/2019   Lab Results  Component Value Date   ALT 24 10/17/2019   AST 19 10/17/2019   ALKPHOS 125 10/17/2019   BILITOT 0.7 10/17/2019   Lab Results  Component Value Date   INR 1.1 10/17/2019   INR 1.0 10/13/2019   INR 1.02 12/05/2014    Current radiological studies    No results found.  Disposition:    Discharge disposition: 01-Home or Self Care       Discharge Instructions    Discharge patient   Complete by: As directed    Discharge disposition: 01-Home or Self Care   Discharge patient date: 10/17/2019      Allergies as of 10/18/2019  Reactions   Tetracycline Itching, Rash      Medication List    TAKE these medications   atenolol 50 MG tablet Commonly known as: TENORMIN TAKE 1 TABLET BY MOUTH EVERY DAY   cholecalciferol 1000 units tablet Commonly known as: VITAMIN D Take 1,000 Units by mouth daily.   CLEAR EYES COMPLETE OP Place 1 drop into both eyes daily as needed (dry eyes).   donepezil 10 MG tablet Commonly known as: ARICEPT TAKE 1 TABLET BY MOUTH AT BEDTIME   doxazosin 8 MG tablet Commonly known as: CARDURA TAKE ONE-HALF TABLET AT BEDTIME What changed: See the new instructions.   fluticasone  50 MCG/ACT nasal spray Commonly known as: FLONASE PLACE 2 SPRAYS IN EACH NOSTRIL DAILY What changed:   how much to take  how to take this  when to take this  reasons to take this  additional instructions   guaiFENesin-dextromethorphan 100-10 MG/5ML syrup Commonly known as: ROBITUSSIN DM Take 5 mLs by mouth every 6 (six) hours as needed for cough.   HumuLIN R U-500 KwikPen 500 UNIT/ML kwikpen Generic drug: insulin regular human CONCENTRATED Inject under skin 80-90 units before b'fast, 60 units before lunch, 80-90 units vefore dinner What changed:   how much to take  how to take this  when to take this  additional instructions   lisinopril 10 MG tablet Commonly known as: ZESTRIL TAKE 1 TABLET BY MOUTH EVERY DAY   memantine 10 MG tablet Commonly known as: NAMENDA TAKE 1 TABLET BY MOUTH TWICE A DAY   montelukast 10 MG tablet Commonly known as: SINGULAIR Take 1 tablet (10 mg total) by mouth at bedtime.   nystatin 100000 UNIT/ML suspension Commonly known as: MYCOSTATIN Take 5 mLs (500,000 Units total) by mouth 4 (four) times daily for 14 days.   omeprazole 40 MG capsule Commonly known as: PRILOSEC Take 1 capsule (40 mg total) by mouth 2 (two) times daily before a meal. What changed: when to take this   Pen Needles 31G X 5 MM Misc 1 each by Does not apply route 3 (three) times daily. To inject insulin E11.42   QC Acetaminophen 8Hr Arth Pain 650 MG CR tablet Generic drug: acetaminophen Take 650 mg by mouth every 8 (eight) hours as needed for pain.   simvastatin 40 MG tablet Commonly known as: ZOCOR TAKE 1 TABLET BY MOUTH AT BEDTIME What changed: when to take this   Testosterone Cypionate 150 MG/ML Soln Inject 1 mL into the muscle every 14 (fourteen) days.   venlafaxine XR 75 MG 24 hr capsule Commonly known as: EFFEXOR-XR TAKE 1 CAPSULE BY MOUTH EVERY MORNING AND 2 CAPSULES AT BEDTIME What changed:   how much to take  how to take this  when to take  this  additional instructions   vitamin C 1000 MG tablet Take 1,000 mg by mouth daily.        Follow-up appointment   7/9 [facility] radiation oncology 7/13 Thoracic oncology  7/28 Dr. Lorelei Pont  Discharge Condition:    stable  Signed: Roselie Awkward 10/18/2019, 1:37 PM

## 2019-10-18 NOTE — Consult Note (Signed)
   [facility] Inpatient Consult   10/18/2019  [name] Sep 05, 1945 [id number]   Patient is currently active with [facility] for chronic disease management services.  Patient has been engaged by a [facility]. Notified [facility] CM RN of patient admission and will continue to follow for progression and disposition.  Of note, [facility] Care Management services does not replace or interfere with any services that are arranged by inpatient case management or social work.  Netta Cedars, MSN, [facility] Liaison Nurse Mobile Phone [phone]  Toll free office [phone]

## 2019-10-18 NOTE — Progress Notes (Signed)
The propose treatment discussed in cancer conference 10/18/19 is for discussion purpose only and is not a binding recommendation.  The patient was not physically examined nor present for their treatment options.  Therefore, final treatment options.  Therefore, final treatment plans cannot be decided.

## 2019-10-18 NOTE — Care Management CC44 (Signed)
Condition Code 44 Documentation Completed  Patient Details  Name: [name] [name]: [id number] Date of Birth: Feb 17, 1946   Condition Code 44 given:    Patient signature on Condition Code 44 notice:    Documentation of 2 MD's agreement:    Code 44 added to claim:       Leeroy Cha, RN 10/18/2019, 3:54 PM

## 2019-10-18 NOTE — Progress Notes (Signed)
[facility]  Feels fine For treatment planning in radiation oncology department this afternoon Hyperglycemia has improved Plan discharge this afternoon  Roselie Awkward, MD [place] [facility] Pager: [phone] Cell: [phone] If no response, call [phone]

## 2019-10-19 ENCOUNTER — Encounter (HOSPITAL_COMMUNITY): Payer: Self-pay | Admitting: Pulmonary Disease

## 2019-10-19 ENCOUNTER — Ambulatory Visit
Admission: RE | Admit: 2019-10-19 | Discharge: 2019-10-19 | Disposition: A | Discharge status: Home / Self Care | Payer: Medicare Other | Source: Ambulatory Visit | Attending: Radiation Oncology | Admitting: Radiation Oncology

## 2019-10-19 ENCOUNTER — Other Ambulatory Visit: Payer: Self-pay

## 2019-10-19 DIAGNOSIS — C3431 Malignant neoplasm of lower lobe, right bronchus or lung: Secondary | ICD-10-CM | POA: Diagnosis not present

## 2019-10-19 DIAGNOSIS — C3411 Malignant neoplasm of upper lobe, right bronchus or lung: Secondary | ICD-10-CM | POA: Diagnosis not present

## 2019-10-19 DIAGNOSIS — Z510 Encounter for antineoplastic radiation therapy: Secondary | ICD-10-CM | POA: Diagnosis not present

## 2019-10-20 ENCOUNTER — Ambulatory Visit: Payer: Medicare Other

## 2019-10-20 NOTE — Progress Notes (Signed)
Radiation Oncology         [phone]) [phone] ________________________________  Initial inpatient Consultation  Name: [name] [name]: [id number]  Date of Service: 10/18/2019 DOB: May 16, 1945  SN:[id number], Frederico Hamman, MD  [name], MD   REFERRING PHYSICIAN: [name], MD  DIAGNOSIS: 74 y.o. 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    ICD-10-CM   1. Hemoptysis  R04.2   2. Lung mass  R91.8     HISTORY OF PRESENT ILLNESS: [name] is a 74 y.o. male seen at the request of Dr. Lake Bells for consideration of palliative radiation to an obstructing, right hilar mass that is causing hemoptysis.  He initially presented to his PCP on 10/10/19 for evaluation of left rib pain after a fall at home. A CXR was performed at that time showing a large, 5 x 6 cm right lung mass, new as compared to CXR from 07/2018. A CT Chest was performed on 10/11/19 for further evaluation and confirmed a large right lower lobe/hilar mass most consistent with primary bronchogenic carcinoma. The mass crossed the right oblique fissure into the right upper lobe, narrowing the RLL bronchus. There was also bulky RIGHT lower paratracheal and subcarinal adenopathy with smaller metastatic nodes in the high RIGHT paratracheal nodal station and two peripheral nodules in the RLL, likely metastatic deposits. The patient was being referred to the multidisciplinary thoracic oncology clinic and PET scan was scheduled for 10/16/19. However, in the interim, the patient presented to the ED on 10/12/19 for progressive hemoptysis, ongoing for several weeks and associated with dyspnea and weight loss. He had been experiencing occasional hemoptysis but had not sought evaluation previously. He was admitted and seen by Dr. Lake Bells with pulmonology.  The hemoptysis had improved with inhaled TXA so the recommendation was for discharge home and work up of the  lung mass with outpatient bronchoscopy. At the time of bronchoscopy on 10/17/19, a large, friable, partially obstructing, endobronchial mass was noted in the right bronchus intermedius. The mass bled with contact from the EBUS scope and due to the amount of bleeding, the biopsy of the mass was not attempted. Needle aspirations were obtained from a station 7 subcarinal node as well as lung tissue through the bronchial wall. Following the procedure, he had a 30 minute episode of dyspnea, cough and congestion with hemoptysis. Therefore, Dr. Lake Bells recommended the patient be admitted for expedited radiation to help control the hemoptysis.  PREVIOUS RADIATION THERAPY: No  PAST MEDICAL HISTORY:  Past Medical History:  Diagnosis Date  . Allergic rhinitis due to pollen   . Cancer ([place])   . Chronic kidney disease, stage III (moderate) 12/19/2012  . Depression   . Diabetes mellitus ([place])   . Diverticulosis   . Erectile dysfunction associated with type 2 diabetes mellitus ([place]) 05/28/2015  . Former very heavy cigarette smoker (more than 40 per day) 10/07/2014  . GERD (gastroesophageal reflux disease)   . Gout   . Hyperlipidemia   . Hypertension   . Hypogonadism male 06/14/2012  . Iron deficiency   . Memory loss   . Osteoarthritis   . Personal history of colonic polyps    1996      PAST SURGICAL HISTORY: Past Surgical History:  Procedure Laterality Date  . BRONCHIAL NEEDLE ASPIRATION BIOPSY  10/17/2019   Procedure: BRONCHIAL NEEDLE ASPIRATION BIOPSIES;  Surgeon: Juanito Doom, MD;  Location: [facility];  Service: Cardiopulmonary;;  . CARDIAC CATHETERIZATION  11/2011   [facility]  . CHOLECYSTECTOMY  1980  . ENDOBRONCHIAL ULTRASOUND Bilateral 10/17/2019   Procedure: ENDOBRONCHIAL ULTRASOUND;  Surgeon: Juanito Doom, MD;  Location: [facility];  Service: Cardiopulmonary;  Laterality: Bilateral;  . ESOPHAGOGASTRODUODENOSCOPY (EGD) WITH PROPOFOL N/A 09/27/2017   Procedure: ESOPHAGOGASTRODUODENOSCOPY  (EGD) WITH PROPOFOL;  Surgeon: Lin Landsman, MD;  Location: [facility];  Service: Gastroenterology;  Laterality: N/A;  . EYE SURGERY Right 07/29/2016   [facility] - Dr. Manuella Ghazi  . EYE SURGERY Left 08/23/2106   cataract - Dr. Manuella Ghazi  . HEMOSTASIS CONTROL  10/17/2019   Procedure: HEMOSTASIS CONTROL;  Surgeon: Juanito Doom, MD;  Location: [facility];  Service: Cardiopulmonary;;  . TOTAL HIP ARTHROPLASTY  2009   Dr. Gladstone Lighter  . TOTAL HIP ARTHROPLASTY      FAMILY HISTORY:  Family History  Problem Relation Age of Onset  . Diabetes Mother   . Alzheimer's disease Mother   . Diabetes Father   . Lung cancer Father        Age 71  . Stroke Father   . Heart attack Father   . Lung cancer Sister        lung  . Heart disease Maternal Grandfather   . COPD Sister   . Heart attack Sister   . Heart disease Sister     SOCIAL HISTORY:  Social History   Socioeconomic History  . Marital status: Widowed    Spouse name: Not on file  . Number of children: 4  . Years of education: 12th  . Highest education level: Not on file  Occupational History  . Occupation: Counsellor  . Occupation: retired Magazine features editor: [employer]  Tobacco Use  . Smoking status: Former Smoker    Packs/day: 3.00    Years: 35.00    Pack years: 105.00    Types: Cigarettes    Quit date: 04/12/2000    Years since quitting: 19.5  . Smokeless tobacco: Never Used  Vaping Use  . Vaping Use: Never used  Substance and Sexual Activity  . Alcohol use: No  . Drug use: No  . Sexual activity: Never  Other Topics Concern  . Not on file  Social History Narrative   No regular exercise   Caffeine use: yes, dt coke   Lives at home with his mother lives with him.   Wife Fraser Din recently deceased   Right-handed.   Social Determinants of Health   Financial Resource Strain: Low Risk   . Difficulty of Paying Living Expenses: Not hard at all  Food Insecurity: No Food Insecurity  . Worried About Ship broker in the Last Year: Never true  . Ran Out of Food in the Last Year: Never true  Transportation Needs: No Transportation Needs  . Lack of Transportation (Medical): No  . Lack of Transportation (Non-Medical): No  Physical Activity: Inactive  . Days of Exercise per Week: 0 days  . Minutes of Exercise per Session: 0 min  Stress: No Stress Concern Present  . Feeling of Stress : Not at all  Social Connections:   . Frequency of Communication with Friends and Family:   . Frequency of Social Gatherings with Friends and Family:   . Attends Religious Services:   . Active Member of Clubs or Organizations:   . Attends Archivist Meetings:   [name] Kitchen Marital Status:   Intimate Partner Violence: Not At Risk  . Fear of Current or Ex-Partner: No  . Emotionally  Abused: No  . Physically Abused: No  . Sexually Abused: No    ALLERGIES: Tetracycline  MEDICATIONS:  Current Outpatient Medications  Medication Sig Dispense Refill  . acetaminophen (QC ACETAMINOPHEN 8HR ARTH PAIN) 650 MG CR tablet Take 650 mg by mouth every 8 (eight) hours as needed for pain.    . Ascorbic Acid (VITAMIN C) 1000 MG tablet Take 1,000 mg by mouth daily.    [name] Kitchen atenolol (TENORMIN) 50 MG tablet TAKE 1 TABLET BY MOUTH EVERY DAY (Patient taking differently: Take 50 mg by mouth daily. ) 90 tablet 1  . cholecalciferol (VITAMIN D) 1000 UNITS tablet Take 1,000 Units by mouth daily.      [name] Kitchen donepezil (ARICEPT) 10 MG tablet TAKE 1 TABLET BY MOUTH AT BEDTIME (Patient taking differently: Take 10 mg by mouth at bedtime. ) 90 tablet 3  . doxazosin (CARDURA) 8 MG tablet TAKE ONE-HALF TABLET AT BEDTIME (Patient taking differently: Take 4 mg by mouth daily. ) 45 tablet 3  . fluticasone (FLONASE) 50 MCG/ACT nasal spray PLACE 2 SPRAYS IN EACH NOSTRIL DAILY (Patient taking differently: Place 2 sprays into both nostrils daily as needed for allergies or rhinitis. ) 48 mL 3  . guaiFENesin-dextromethorphan (ROBITUSSIN DM) 100-10 MG/5ML syrup  Take 5 mLs by mouth every 6 (six) hours as needed for cough. 118 mL 0  . Hyprom-Naphaz-Polysorb-Zn Sulf (CLEAR EYES COMPLETE OP) Place 1 drop into both eyes daily as needed (dry eyes).    . Insulin Pen Needle (PEN NEEDLES) 31G X 5 MM MISC 1 each by Does not apply route 3 (three) times daily. To inject insulin E11.42 90 each 2  . insulin regular human CONCENTRATED (HUMULIN R U-500 KWIKPEN) 500 UNIT/ML kwikpen Inject under skin 80-90 units before b'fast, 60 units before lunch, 80-90 units vefore dinner (Patient taking differently: Inject 20-40 Units into the skin See admin instructions. Takes 35-40 units before breakfast, 20 units at lunch and 35-40 units at night) 6 pen 5  . lisinopril (ZESTRIL) 10 MG tablet TAKE 1 TABLET BY MOUTH EVERY DAY (Patient taking differently: Take 10 mg by mouth daily. ) 90 tablet 3  . memantine (NAMENDA) 10 MG tablet TAKE 1 TABLET BY MOUTH TWICE A DAY (Patient taking differently: Take 10 mg by mouth 2 (two) times daily. ) 180 tablet 3  . montelukast (SINGULAIR) 10 MG tablet Take 1 tablet (10 mg total) by mouth at bedtime. 30 tablet 3  . nystatin (MYCOSTATIN) 100000 UNIT/ML suspension Take 5 mLs (500,000 Units total) by mouth 4 (four) times daily for 14 days. 60 mL 0  . omeprazole (PRILOSEC) 40 MG capsule Take 1 capsule (40 mg total) by mouth 2 (two) times daily before a meal. (Patient taking differently: Take 40 mg by mouth daily. ) 180 capsule 1  . simvastatin (ZOCOR) 40 MG tablet TAKE 1 TABLET BY MOUTH AT BEDTIME (Patient taking differently: Take 40 mg by mouth daily. ) 90 tablet 2  . Testosterone Cypionate 150 MG/ML SOLN Inject 1 mL into the muscle every 14 (fourteen) days. 6 mL 1  . venlafaxine XR (EFFEXOR-XR) 75 MG 24 hr capsule TAKE 1 CAPSULE BY MOUTH EVERY MORNING AND 2 CAPSULES AT BEDTIME (Patient taking differently: Take 75 mg by mouth See admin instructions. Takes 1 capsule by mouth in the morning and 2 capsules at night.) 270 capsule 1   No current  facility-administered medications for this encounter.    REVIEW OF SYSTEMS:  On review of systems, the patient reports that he is doing  well overall. Currently, he denies any chest pain, increased shortness of breath, fevers, chills, or night sweats. He does have baseline shortness of breath and cough, unchanged recently. He has had recent unintended weight loss. He denies any bowel or bladder disturbances, and denies abdominal pain, nausea or vomiting. He denies any new musculoskeletal or joint aches or pains. A complete review of systems is obtained and is otherwise negative.   PHYSICAL EXAM:  Wt Readings from Last 3 Encounters:  10/18/19 198 lb 10.2 oz (90.1 kg)  10/12/19 200 lb (90.7 kg)  10/10/19 199 lb (90.3 kg)   Temp Readings from Last 3 Encounters:  10/18/19 97.7 F (36.5 C) (Oral)  10/14/19 98.4 F (36.9 C) (Oral)  10/10/19 98.8 F (37.1 C) (Temporal)   BP Readings from Last 3 Encounters:  10/18/19 (!) 167/72  10/14/19 (!) 106/57  10/10/19 130/62   Pulse Readings from Last 3 Encounters:  10/18/19 74  10/14/19 77  10/10/19 (!) 128    /10  In general this is a well appearing Caucasian male in no acute distress. He is alert and oriented x4 and appropriate throughout the examination. HEENT reveals that the patient is normocephalic, atraumatic. EOMs are intact. PERRLA. Skin is intact without any evidence of gross lesions. Cardiopulmonary assessment is negative for acute distress and he exhibits normal effort.  Lymphatic assessment is performed and does not reveal any adenopathy in the cervical, supraclavicular, or axillary chains.  Lower extremities are negative for pretibial pitting edema, deep calf tenderness, cyanosis or clubbing.   KPS = 90  100 - Normal; no complaints; no evidence of disease. 90   - Able to carry on normal activity; minor signs or symptoms of disease. 80   - Normal activity with effort; some signs or symptoms of disease. 81   - Cares for self; unable  to carry on normal activity or to do active work. 60   - Requires occasional assistance, but is able to care for most of his personal needs. 50   - Requires considerable assistance and frequent medical care. 58   - Disabled; requires special care and assistance. 60   - Severely disabled; hospital admission is indicated although death not imminent. 10   - Very sick; hospital admission necessary; active supportive treatment necessary. 10   - Moribund; fatal processes progressing rapidly. 0     - Dead  Karnofsky DA, Abelmann [facility] [place], Craver LS and Burchenal [facility] [phone]) The use of the nitrogen mustards in the palliative treatment of carcinoma: with particular reference to bronchogenic carcinoma Cancer 1 634-56  LABORATORY DATA:  Lab Results  Component Value Date   WBC 7.8 10/18/2019   HGB 10.2 (L) 10/18/2019   HCT 30.7 (L) 10/18/2019   MCV 83.9 10/18/2019   PLT 354 10/18/2019   Lab Results  Component Value Date   NA 135 10/18/2019   K 3.8 10/18/2019   CL 97 (L) 10/18/2019   CO2 26 10/18/2019   Lab Results  Component Value Date   ALT 24 10/17/2019   AST 19 10/17/2019   ALKPHOS 125 10/17/2019   BILITOT 0.7 10/17/2019     RADIOGRAPHY: DG Chest 2 View  Result Date: 10/12/2019 CLINICAL DATA:  Hemoptysis, fall EXAM: CHEST - 2 VIEW COMPARISON:  Chest x-ray 10/10/2019, CT chest 10/11/2019 FINDINGS: No pleural effusion or pneumothorax. No interval consolidation. Similar right hilar mass and adjacent adenopathy. Normal heart size. No pneumothorax. Degenerative changes of the spine. Rounded metallic density superiorly on lateral view appears external  to the patient on frontal view. IMPRESSION: Similar right hilar mass and adjacent adenopathy. No acute interval changes. Electronically Signed   By: Donavan Foil M.D.   On: 10/12/2019 17:48   DG Ribs Unilateral W/Chest Left  Result Date: 10/10/2019 CLINICAL DATA:  Pain following fall EXAM: LEFT RIBS AND CHEST - 3+ VIEW COMPARISON:  August 01, 2018  FINDINGS: Frontal chest as well as oblique and cone-down rib images were obtained. There is a mass in the right perihilar region measuring 5.7 x 4.5 cm. There is suspected adenopathy in the right hilum and azygos regions. Lungs elsewhere are clear. Heart size and pulmonary vascularity are normal. No adenopathy. There is no evident pneumothorax or pleural effusion. No evident rib fracture. There is apparent soft tissue prominence in the left anterior hemithorax. IMPRESSION: 1.  No evident rib fracture. 2. Apparent mass in or overlying the right perihilar region measuring 5.7 x 4.5 cm with probable adenopathy in the right hilum and azygos regions. Advise correlation with contrast enhanced chest CT to further evaluate. 3. Soft tissue prominence anterior left hemithorax of uncertain etiology. Particular attention this area on CT advised. These results will be called to the ordering clinician or representative by the Radiologist Assistant, and communication documented in the PACS or [employer]. Electronically Signed   By: Lowella Grip III M.D.   On: 10/10/2019 11:44   CT Chest W Contrast  Result Date: 10/11/2019 CLINICAL DATA:  Abnormal chest x-ray with RIGHT hilar mass EXAM: CT CHEST WITH CONTRAST TECHNIQUE: Multidetector CT imaging of the chest was performed during intravenous contrast administration. CONTRAST:  33mL OMNIPAQUE IOHEXOL 300 MG/ML  SOLN COMPARISON:  Chest radiograph 10/10/2018 FINDINGS: Cardiovascular: No significant vascular findings. Normal heart size. No pericardial effusion. Mediastinum/Nodes: No axillary supraclavicular adenopathy. The small benign appearing cyst in the LEFT lobe of thyroid gland measuring 10 mm. Bulky RIGHT paratracheal adenopathy. RIGHT lower paratracheal lymph node measures 32 mm short axis. Smaller high RIGHT paratracheal lymph nodes measuring 6 mm on image 23/2. Large subcarinal lymph node measuring 21 mm. Large hilar /perihilar mass posterior to the RIGHT hilum  along the bronchus intermedius and RIGHT lower lobe bronchus measures 5.5 by 5.6 cm and demonstrates uniform enhancement. Smaller pleural base nodule measuring 2.5 cm posterior to the mass on image 53/2. Additional peripheral nodule in the RIGHT lower lobe along the pleural surface measures 13 mm on image 76/3. Lungs/Pleura: RIGHT lower lobe/RIGHT hilar mass described in the mediastinal section. Mass does span the oblique fissure into the RIGHT upper lobe seen on sagittal imaging Upper Abdomen: Limited view of the liver, kidneys, pancreas are unremarkable. Normal adrenal glands. Musculoskeletal: No aggressive osseous lesion IMPRESSION: 1. Large RIGHT lower lobe/RIGHT hilar mass most consistent with primary bronchogenic carcinoma (consider small cell lung cancer). Mass does across the RIGHT oblique fissure into the RIGHT upper lobe. Mass also narrows the RIGHT lower lobe bronchus. 2. Bulky RIGHT lower paratracheal and subcarinal adenopathy. Smaller metastatic nodes in the high RIGHT paratracheal nodal station. 3. No evidence of supraclavicular nodal metastasis. 4. Two peripheral nodules in the RIGHT lower lobe likely metastatic deposits or primary lesion. 5. If multidisciplinary follow up management is desired, this is available in the [place] through the Multidisciplinary Thoracic Clinic [phone]. These results will be called to the ordering clinician or representative by the Radiologist Assistant, and communication documented in the PACS or [employer]. Electronically Signed   By: Suzy Bouchard M.D.   On: 10/11/2019 15:40  IMPRESSION/PLAN: 11. 74 y.o. 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 Today, I talked to the patient, at the time of his CT SIM/treatment planning visit, about the findings and workup thus far. We discussed the natural history of bronchogenic carcinoma and general  treatment, highlighting the role of radiotherapy in the management. We discussed the available radiation techniques, and focused on the details and logistics of delivery. Currently, the recommendation is to proceed with a 3 week course of radiotherapy, prior to tissue confirmation, to help control the hemoptysis and shrink the obstructing lung mass. We reviewed the anticipated acute and late sequelae associated with radiation in this setting.  We discussed that once we have tissue confirmation and PET and MRI brain imaging to complete his disease staging, the recommended course of treatment may be altered. The patient was encouraged to ask questions that were answered to his satisfaction and he is in agreement to proceed. He has freely signed written consent and a copy of this document will be placed in his chart. We will proceed accordingly, with treatment planning in anticipation of beginning his daily treatments later today. He is scheduled for an outpatient consult with Dr. [name] on 10/23/19 to discuss systemic treatment of his disease.   In a visit lasting 45 minutes, greater than 50% of that time was spent in floor time, discussing his case and coordinating his care.   Nicholos Johns, MMS, [name] [facility] [place]: [phone]  Fax: [phone]

## 2019-10-21 ENCOUNTER — Ambulatory Visit: Payer: Medicare Other

## 2019-10-21 NOTE — Progress Notes (Signed)
[place] [phone]   [phone]  CONSULT NOTE  REFERRING PHYSICIAN: Dr. Lorelei Pont  REASON FOR CONSULTATION:  Non-Small Cell Lung Cancer  HPI [name] is a 74 y.o. male with a past medical history significant for tobacco use, essential hypertension, obstructive sleep apnea, GERD, diabetes mellitus, erectile dysfunction, diabetic neuropathy, osteoarthritis, chronic kidney disease, hyperlipidemia, TIA, and gout is referred to the clinic for evaluation of newly diagnosed lung cancer.  The patient's evaluation began on 10/10/2019 after presenting to his PCP with left sided rib pain secondary to a fall. The patient had fallen and hit his ribs on a dresser. The patient had a chest x-ray performed that day which noted a 5 x 6 cm mass in the right lung.  The patient then had a CT scan the following day on 10/11/2019 which noted a Large right lower lobe/right hilar mass. Bulky right lower paratracheal and subcarinal adenopathy. Smaller metastatic nodes in the high right paratracheal nodal station. Two peripheral nodules in the right lower lobe likely metastatic deposits or primary lesion.  The patient was then seen in the emergency room on 10/12/2019 for the chief complaint of hemoptysis.  The patient had a bronchoscopy performed by Dr. Lake Bells on 7/7 and the pathology was consistent with non-small cell lung cancer.  During the patient's bronchoscopy noted that the patient had critical obstruction of the bronchus intermedius.  Therefore, the patient was seen by radiation oncology who determined that the patient should undergo a 3-week course of radiotherapy to the obstruction. He has had 3 treatments thus far.   The patient has not had his staging PET scan performed yet due to him being in the hospital for a brief period of time for hyperglycemia during his scheduled outpatient PET scan appointment time.   Overall, the patient is feeling "pretty decent" today.   His hemoptysis has resolved since starting radiotherapy.  He denies any recent fever, chills, night sweats, or unexplained weight loss.  He denies any chest pain.  He reports very mild shortness of breath.  He reports a cough at nighttime which has been occurring for approximately 2 to 3 weeks.  He denies any constipation.  He reports mild diarrhea today.  He denies any headaches or visual changes.   The patient's mother has heart disease and dementia.  The patient's father had diabetes mellitus, hypertension, hyperlipidemia, a CVA, as well as an MI.  The patient had a sister who had lung cancer as well as COPD.  The patient worked as a tow Administrator.  He is widowed and has 3 children and one stepdaughter.  He quit smoking in 1998.  He estimates that he smoked for approximately 40 years 2 to 3 packs a day.  He denies any drug use.  The patient reports that he has not drank alcohol in approximately 30 to 40 years.     HPI  Past Medical History:  Diagnosis Date  . Allergic rhinitis due to pollen   . Cancer ([place])   . Chronic kidney disease, stage III (moderate) 12/19/2012  . Depression   . Diabetes mellitus ([place])   . Diverticulosis   . Erectile dysfunction associated with type 2 diabetes mellitus ([place]) 05/28/2015  . Former very heavy cigarette smoker (more than 40 per day) 10/07/2014  . GERD (gastroesophageal reflux disease)   . Gout   . Hyperlipidemia   . Hypertension   . Hypogonadism male 06/14/2012  . Iron deficiency   . Memory  loss   . Osteoarthritis   . Personal history of colonic polyps    1996    Past Surgical History:  Procedure Laterality Date  . BRONCHIAL NEEDLE ASPIRATION BIOPSY  10/17/2019   Procedure: BRONCHIAL NEEDLE ASPIRATION BIOPSIES;  Surgeon: Juanito Doom, MD;  Location: [facility];  Service: Cardiopulmonary;;  . CARDIAC CATHETERIZATION  11/2011   [facility]  . CHOLECYSTECTOMY  1980  . ENDOBRONCHIAL ULTRASOUND Bilateral 10/17/2019   Procedure: ENDOBRONCHIAL  ULTRASOUND;  Surgeon: Juanito Doom, MD;  Location: [facility];  Service: Cardiopulmonary;  Laterality: Bilateral;  . ESOPHAGOGASTRODUODENOSCOPY (EGD) WITH PROPOFOL N/A 09/27/2017   Procedure: ESOPHAGOGASTRODUODENOSCOPY (EGD) WITH PROPOFOL;  Surgeon: Lin Landsman, MD;  Location: [facility];  Service: Gastroenterology;  Laterality: N/A;  . EYE SURGERY Right 07/29/2016   cataract - Dr. Manuella Ghazi  . EYE SURGERY Left 08/23/2106   cataract - Dr. Manuella Ghazi  . HEMOSTASIS CONTROL  10/17/2019   Procedure: HEMOSTASIS CONTROL;  Surgeon: Juanito Doom, MD;  Location: [facility];  Service: Cardiopulmonary;;  . TOTAL HIP ARTHROPLASTY  2009   Dr. Gladstone Lighter  . TOTAL HIP ARTHROPLASTY      Family History  Problem Relation Age of Onset  . Diabetes Mother   . Alzheimer's disease Mother   . Diabetes Father   . Lung cancer Father        Age 5  . Stroke Father   . Heart attack Father   . Lung cancer Sister        lung  . Heart disease Maternal Grandfather   . COPD Sister   . Heart attack Sister   . Heart disease Sister     Social History Social History   Tobacco Use  . Smoking status: Former Smoker    Packs/day: 3.00    Years: 35.00    Pack years: 105.00    Types: Cigarettes    Quit date: 04/12/2000    Years since quitting: 19.5  . Smokeless tobacco: Never Used  Vaping Use  . Vaping Use: Never used  Substance Use Topics  . Alcohol use: No  . Drug use: No    Allergies  Allergen Reactions  . Tetracycline Itching and Rash    Current Outpatient Medications  Medication Sig Dispense Refill  . acetaminophen (QC ACETAMINOPHEN 8HR ARTH PAIN) 650 MG CR tablet Take 650 mg by mouth every 8 (eight) hours as needed for pain.    . Ascorbic Acid (VITAMIN C) 1000 MG tablet Take 1,000 mg by mouth daily.    [name] Kitchen atenolol (TENORMIN) 50 MG tablet TAKE 1 TABLET BY MOUTH EVERY DAY (Patient taking differently: Take 50 mg by mouth daily. ) 90 tablet 1  . cholecalciferol (VITAMIN D) 1000 UNITS  tablet Take 1,000 Units by mouth daily.      [name] Kitchen donepezil (ARICEPT) 10 MG tablet TAKE 1 TABLET BY MOUTH AT BEDTIME (Patient taking differently: Take 10 mg by mouth at bedtime. ) 90 tablet 3  . doxazosin (CARDURA) 8 MG tablet TAKE ONE-HALF TABLET AT BEDTIME (Patient taking differently: Take 4 mg by mouth daily. ) 45 tablet 3  . fluticasone (FLONASE) 50 MCG/ACT nasal spray PLACE 2 SPRAYS IN EACH NOSTRIL DAILY (Patient taking differently: Place 2 sprays into both nostrils daily as needed for allergies or rhinitis. ) 48 mL 3  . guaiFENesin-dextromethorphan (ROBITUSSIN DM) 100-10 MG/5ML syrup Take 5 mLs by mouth every 6 (six) hours as needed for cough. 118 mL 0  . Hyprom-Naphaz-Polysorb-Zn Sulf (CLEAR EYES COMPLETE OP) Place 1 drop  into both eyes daily as needed (dry eyes).    . Insulin Pen Needle (PEN NEEDLES) 31G X 5 MM MISC 1 each by Does not apply route 3 (three) times daily. To inject insulin E11.42 90 each 2  . insulin regular human CONCENTRATED (HUMULIN R U-500 KWIKPEN) 500 UNIT/ML kwikpen Inject under skin 80-90 units before b'fast, 60 units before lunch, 80-90 units vefore dinner (Patient taking differently: Inject 20-40 Units into the skin See admin instructions. Takes 35-40 units before breakfast, 20 units at lunch and 35-40 units at night) 6 pen 5  . lisinopril (ZESTRIL) 10 MG tablet TAKE 1 TABLET BY MOUTH EVERY DAY (Patient taking differently: Take 10 mg by mouth daily. ) 90 tablet 3  . memantine (NAMENDA) 10 MG tablet TAKE 1 TABLET BY MOUTH TWICE A DAY (Patient taking differently: Take 10 mg by mouth 2 (two) times daily. ) 180 tablet 3  . montelukast (SINGULAIR) 10 MG tablet Take 1 tablet (10 mg total) by mouth at bedtime. 30 tablet 3  . nystatin (MYCOSTATIN) 100000 UNIT/ML suspension Take 5 mLs (500,000 Units total) by mouth 4 (four) times daily for 14 days. 60 mL 0  . omeprazole (PRILOSEC) 40 MG capsule Take 1 capsule (40 mg total) by mouth 2 (two) times daily before a meal. (Patient taking  differently: Take 40 mg by mouth daily. ) 180 capsule 1  . simvastatin (ZOCOR) 40 MG tablet TAKE 1 TABLET BY MOUTH AT BEDTIME (Patient taking differently: Take 40 mg by mouth daily. ) 90 tablet 2  . Testosterone Cypionate 150 MG/ML SOLN Inject 1 mL into the muscle every 14 (fourteen) days. 6 mL 1  . venlafaxine XR (EFFEXOR-XR) 75 MG 24 hr capsule TAKE 1 CAPSULE BY MOUTH EVERY MORNING AND 2 CAPSULES AT BEDTIME (Patient taking differently: Take 75 mg by mouth See admin instructions. Takes 1 capsule by mouth in the morning and 2 capsules at night.) 270 capsule 1  . prochlorperazine (COMPAZINE) 10 MG tablet Take 1 tablet (10 mg total) by mouth every 6 (six) hours as needed. 30 tablet 2   No current facility-administered medications for this visit.    Review of Systems  REVIEW OF SYSTEMS:   Review of Systems  Constitutional: Negative for appetite change, chills, fatigue, fever and unexpected weight change.  HENT: Negative for mouth sores, nosebleeds, sore throat and trouble swallowing.   Eyes: Negative for eye problems and icterus.  Respiratory: Positive for cough at night and mild shortness of breath.  Negative for  hemoptysis (improved) and wheezing.   Cardiovascular: Negative for chest pain and leg swelling.  Gastrointestinal: Negative for abdominal pain, constipation, diarrhea, nausea and vomiting.  Genitourinary: Negative for bladder incontinence, difficulty urinating, dysuria, frequency and hematuria.   Musculoskeletal: Negative for back pain, gait problem, neck pain and neck stiffness.  Skin: Negative for itching and rash.  Neurological: Negative for dizziness, extremity weakness, gait problem, headaches, light-headedness and seizures.  Hematological: Negative for adenopathy. Does not bruise/bleed easily.  Psychiatric/Behavioral: Negative for confusion, depression and sleep disturbance. The patient is not nervous/anxious.     PHYSICAL EXAMINATION:  Blood pressure 132/63, pulse 84,  temperature 97.6 F (36.4 C), temperature source Temporal, resp. rate 14, height 5\' 11"  (1.803 m), weight 200 lb (90.7 kg), SpO2 99 %.  ECOG PERFORMANCE STATUS: 1  Physical Exam  Constitutional: Oriented to person, place, and time and well-developed, well-nourished, and in no distress.  HENT:  Head: Normocephalic and atraumatic.  Mouth/Throat: Oropharynx is clear and moist. No oropharyngeal  exudate.  Eyes: Conjunctivae are normal. Right eye exhibits no discharge. Left eye exhibits no discharge. No scleral icterus.  Neck: Normal range of motion. Neck supple.  Cardiovascular: Normal rate, regular rhythm, normal heart sounds and intact distal pulses.   Pulmonary/Chest: Effort normal and breath sounds normal. No respiratory distress. No wheezes. No rales.  Abdominal: Soft. Bowel sounds are normal. Exhibits no distension and no mass. There is no tenderness.  Musculoskeletal: Normal range of motion. Exhibits no edema.  Lymphadenopathy:    No cervical adenopathy.  Neurological: Alert and oriented to person, place, and time. Exhibits normal muscle tone. Gait normal. Coordination normal.  Skin: Skin is warm and dry. No rash noted. Not diaphoretic. No erythema. No pallor.  Psychiatric: Mood, memory and judgment normal.  Vitals reviewed.  LABORATORY DATA: Lab Results  Component Value Date   WBC 7.9 10/23/2019   HGB 10.9 (L) 10/23/2019   HCT 33.6 (L) 10/23/2019   MCV 84.8 10/23/2019   PLT 298 10/23/2019      Chemistry      Component Value Date/Time   NA 136 10/23/2019 1435   NA 130 (L) 11/25/2011 1119   NA 137 11/16/2011 0513   K 4.0 10/23/2019 1435   K 4.1 11/16/2011 0513   CL 97 (L) 10/23/2019 1435   CL 100 11/16/2011 0513   CO2 29 10/23/2019 1435   CO2 29 11/16/2011 0513   BUN 22 10/23/2019 1435   BUN 25 11/25/2011 1119   BUN 15 11/16/2011 0513   CREATININE 1.46 (H) 10/23/2019 1435   CREATININE 1.26 11/16/2011 0513      Component Value Date/Time   CALCIUM 9.1 10/23/2019  1435   CALCIUM 9.1 11/16/2011 0513   ALKPHOS 90 10/23/2019 1435   AST 10 (L) 10/23/2019 1435   ALT 15 10/23/2019 1435   BILITOT 0.3 10/23/2019 1435       RADIOGRAPHIC STUDIES: DG Chest 2 View  Result Date: 10/12/2019 CLINICAL DATA:  Hemoptysis, fall EXAM: CHEST - 2 VIEW COMPARISON:  Chest x-ray 10/10/2019, CT chest 10/11/2019 FINDINGS: No pleural effusion or pneumothorax. No interval consolidation. Similar right hilar mass and adjacent adenopathy. Normal heart size. No pneumothorax. Degenerative changes of the spine. Rounded metallic density superiorly on lateral view appears external to the patient on frontal view. IMPRESSION: Similar right hilar mass and adjacent adenopathy. No acute interval changes. Electronically Signed   By: Donavan Foil M.D.   On: 10/12/2019 17:48   DG Ribs Unilateral W/Chest Left  Result Date: 10/10/2019 CLINICAL DATA:  Pain following fall EXAM: LEFT RIBS AND CHEST - 3+ VIEW COMPARISON:  August 01, 2018 FINDINGS: Frontal chest as well as oblique and cone-down rib images were obtained. There is a mass in the right perihilar region measuring 5.7 x 4.5 cm. There is suspected adenopathy in the right hilum and azygos regions. Lungs elsewhere are clear. Heart size and pulmonary vascularity are normal. No adenopathy. There is no evident pneumothorax or pleural effusion. No evident rib fracture. There is apparent soft tissue prominence in the left anterior hemithorax. IMPRESSION: 1.  No evident rib fracture. 2. Apparent mass in or overlying the right perihilar region measuring 5.7 x 4.5 cm with probable adenopathy in the right hilum and azygos regions. Advise correlation with contrast enhanced chest CT to further evaluate. 3. Soft tissue prominence anterior left hemithorax of uncertain etiology. Particular attention this area on CT advised. These results will be called to the ordering clinician or representative by the Radiologist Assistant, and communication documented in  the PACS  or [employer]. Electronically Signed   By: Lowella Grip III M.D.   On: 10/10/2019 11:44   CT Chest W Contrast  Result Date: 10/11/2019 CLINICAL DATA:  Abnormal chest x-ray with RIGHT hilar mass EXAM: CT CHEST WITH CONTRAST TECHNIQUE: Multidetector CT imaging of the chest was performed during intravenous contrast administration. CONTRAST:  35mL OMNIPAQUE IOHEXOL 300 MG/ML  SOLN COMPARISON:  Chest radiograph 10/10/2018 FINDINGS: Cardiovascular: No significant vascular findings. Normal heart size. No pericardial effusion. Mediastinum/Nodes: No axillary supraclavicular adenopathy. The small benign appearing cyst in the LEFT lobe of thyroid gland measuring 10 mm. Bulky RIGHT paratracheal adenopathy. RIGHT lower paratracheal lymph node measures 32 mm short axis. Smaller high RIGHT paratracheal lymph nodes measuring 6 mm on image 23/2. Large subcarinal lymph node measuring 21 mm. Large hilar /perihilar mass posterior to the RIGHT hilum along the bronchus intermedius and RIGHT lower lobe bronchus measures 5.5 by 5.6 cm and demonstrates uniform enhancement. Smaller pleural base nodule measuring 2.5 cm posterior to the mass on image 53/2. Additional peripheral nodule in the RIGHT lower lobe along the pleural surface measures 13 mm on image 76/3. Lungs/Pleura: RIGHT lower lobe/RIGHT hilar mass described in the mediastinal section. Mass does span the oblique fissure into the RIGHT upper lobe seen on sagittal imaging Upper Abdomen: Limited view of the liver, kidneys, pancreas are unremarkable. Normal adrenal glands. Musculoskeletal: No aggressive osseous lesion IMPRESSION: 1. Large RIGHT lower lobe/RIGHT hilar mass most consistent with primary bronchogenic carcinoma (consider small cell lung cancer). Mass does across the RIGHT oblique fissure into the RIGHT upper lobe. Mass also narrows the RIGHT lower lobe bronchus. 2. Bulky RIGHT lower paratracheal and subcarinal adenopathy. Smaller metastatic nodes in the high  RIGHT paratracheal nodal station. 3. No evidence of supraclavicular nodal metastasis. 4. Two peripheral nodules in the RIGHT lower lobe likely metastatic deposits or primary lesion. 5. If multidisciplinary follow up management is desired, this is available in the [place] through the [facility] [phone]. These results will be called to the ordering clinician or representative by the Radiologist Assistant, and communication documented in the PACS or [employer]. Electronically Signed   By: Suzy Bouchard M.D.   On: 10/11/2019 15:40    ASSESSMENT: This is a very pleasant 74 year old Caucasian male diagnosed with at least stage IIIC non-small cell lung cancer, pending further characterization to determine the subtype.  He presented with a large right lower lobe/right hilar mass with obstruction of the right lower lobe bronchus.  The patient also has associated bulky right lower paratracheal and subcarinal adenopathy.  He was diagnosed in July 2021.   PLAN: The patient was seen with Dr. [name] today.  Dr. [name] had a lengthy discussion with the patient about his current condition and recommendations for further work-up and treatment options.  It appears that the patient is at least stage IIIc.  Dr. [name] recommends that the patient undergo weekly concurrent chemoradiation with carboplatin for an AUC of 2 and paclitaxel 45 mg/m.  The patient is interested in this option and he is expected to receive his first dose of treatment on 10/29/2019.  The adverse side effects of treatment were discussed including but not limited to nausea, vomiting, myelosuppression, alopecia, and renal/liver dysfunction.  I will arrange for chemo education class prior to his first cycle of treatment.  I sent a prescription for Compazine 10 mg p.o. every 6 hours as needed for nausea to his pharmacy.  The patient was instructed to reschedule  his initial staging PET scan at his  earliest availability.  I have also placed an order for the patient initial staging brain MRI.  If the patient's PET scan/brain MRI showed that the patient has metastatic disease, we will bring the patient into the clinic to discuss changes and treatment options.  We will see him back for follow-up visit in 2 weeks for evaluation before starting cycle #2  The patient voices understanding of current disease status and treatment options and is in agreement with the current care plan.  All questions were answered. The patient knows to call the clinic with any problems, questions or concerns. We can certainly see the patient much sooner if necessary.  Thank you so much for allowing me to participate in the care of SHOURYA MACPHERSON. I will continue to follow up the patient with you and assist in his care.  The total time spent in the appointment was 90 minutes.  Disclaimer: This note was dictated with voice recognition software. Similar sounding words can inadvertently be transcribed and may not be corrected upon review.   Dashanna Kinnamon L Shameria Trimarco October 23, 2019, 4:22 PM  ADDENDUM: Hematology/Oncology Attending: I had a face-to-face encounter with the patient today.  I recommended his care plan.  This is a very pleasant 74 years old white male who presented to his primary care physician on October 10, 2019 complaining of left-sided chest pain after a fall.  The patient had x-ray of the ribs performed on October 10, 2019 and it showed apparent mass and or overlying the right perihilar region measuring 5.7 x 4.5 cm with probable adenopathy in the right hilum and azygous region.  He had CT scan of the chest with contrast on October 11, 2019 and it showed large hilar/perihilar mass posterior to the right hilum along the bronchus intermedius and right lower lobe bronchus measuring 5.5 x 5.6 cm and demonstrates uniform enhancement.  There was a smaller pleural-based nodule measuring 2.5 cm posterior to the mass.   Additional peripheral nodule in the right lower lobe along the pleural surface measuring 1.3 cm.  There was also bulky right paratracheal adenopathy.  Right lower lobe paratracheal node measured 3.2 cm, smaller high right paratracheal lymph node measuring 0.6 cm enlarged subcarinal lymph node measuring 2.1 cm.  The larger right lower lobe lung mass does expand the oblique fissure and to the right upper lobe.  The patient was seen by Dr. Lake Bells and on October 17, 2019 he underwent bronchoscopy.  The final pathology ([id number]) showed malignant cells consistent with metastatic non-small cell carcinoma.  The histology subtype was not identified in the pathology report. The patient was referred to [place] today for further evaluation and recommendation regarding treatment of his condition.  He is currently undergoing palliative radiotherapy to the obstructive right hilar lesion under the care of Dr. Lisbeth Renshaw. I had a lengthy discussion with the patient and his son today about his current disease stage, prognosis and treatment options.  The patient has a stage IIIb (T3, N2, M0) non-small cell lung cancer pending further staging work-up. I recommended for the patient to complete the staging work-up by ordering a PET scan as well as MRI of the brain to rule out any other metastatic disease. I had a lengthy discussion with the patient and his son about his treatment options and I recommended for him a course of concurrent chemoradiation with weekly carboplatin for AUC of 2 and paclitaxel 45 mg/M2 followed by consolidation immunotherapy if the patient has  no evidence for disease progression after the induction phase and if there is no evidence for disease metastasis on the upcoming imaging studies. We discussed with the patient the adverse effect of this treatment including but not limited to alopecia, myelosuppression, nausea and vomiting, peripheral neuropathy, liver or renal dysfunction. The patient will have a  chemotherapy education class before the first dose of his treatment. We will call his pharmacy with prescription for Compazine 10 mg p.o. every 6 hours as needed for nausea. He is expected to start the first dose of his treatment next week and he will come back for follow-up visit in 2 weeks for evaluation and management of any adverse effect of his treatment. The patient was advised to call immediately if he has any other concerning symptoms in the interval.  Disclaimer: This note was dictated with voice recognition software. Similar sounding words can inadvertently be transcribed and may be missed upon review. Eilleen Kempf, MD 10/23/19

## 2019-10-22 ENCOUNTER — Ambulatory Visit
Admission: RE | Admit: 2019-10-22 | Discharge: 2019-10-22 | Disposition: A | Discharge status: Home / Self Care | Payer: Medicare Other | Source: Ambulatory Visit | Attending: Radiation Oncology | Admitting: Radiation Oncology

## 2019-10-22 ENCOUNTER — Other Ambulatory Visit: Payer: Self-pay

## 2019-10-22 ENCOUNTER — Ambulatory Visit: Payer: Medicare Other

## 2019-10-22 ENCOUNTER — Other Ambulatory Visit: Payer: Self-pay | Admitting: *Deleted

## 2019-10-22 DIAGNOSIS — C3411 Malignant neoplasm of upper lobe, right bronchus or lung: Secondary | ICD-10-CM | POA: Diagnosis not present

## 2019-10-22 DIAGNOSIS — C3431 Malignant neoplasm of lower lobe, right bronchus or lung: Secondary | ICD-10-CM | POA: Diagnosis not present

## 2019-10-22 DIAGNOSIS — Z510 Encounter for antineoplastic radiation therapy: Secondary | ICD-10-CM | POA: Diagnosis not present

## 2019-10-22 NOTE — Patient Outreach (Signed)
[facility]) Care Management  10/22/2019  [name] 11-24-1945 [id number]   Noted that member was admitted to hospital 7/7-7/8 for observation related to hemoptysis.  Call placed to member, he report he is doing well now.  State he was recently diagnosed with lung cancer and is undergoing daily radiation treatments.  He will have a PET scan within the next few weeks to determine if the treatments are working and if he will need a different plan of care.  He denies any complications at this time, denies any urgent concerns.  Will follow up within the next 2 weeks as planned.  Goals Addressed              This Visit's Progress   .  Finish radiation treatment and get better (pt-stated)        CARE PLAN ENTRY (see longitudinal plan of care for additional care plan information)  Current Barriers:  [name] Kitchen Knowledge Deficits related to new cancer diagnosis  Nurse Case Manager Clinical Goal(s):  [name] Kitchen Over the next 28 days, patient will verbalize understanding of plan for cancer treatments . Over the next 31 days, patient will not experience hospital admission. Hospital Admissions in last 6 months = 2 . Over the next 28 days, patient will attend all scheduled medical appointments: with cancer center  Interventions:  . Inter-disciplinary care team collaboration (see longitudinal plan of care) . Evaluation of current treatment plan related to radiation therapy and patient's adherence to plan as established by provider. . Discussed plans with patient for ongoing care management follow up and provided patient with direct contact information for care management team . Reviewed scheduled/upcoming provider appointments including: cancer radiation therapy  Patient Self Care Activities:  . Attends all scheduled provider appointments . Performs ADL's independently . Calls provider office for new concerns or questions  Initial goal documentation         [name], RN,  MSN [place] [phone]

## 2019-10-23 ENCOUNTER — Other Ambulatory Visit: Payer: Self-pay | Admitting: Internal Medicine

## 2019-10-23 ENCOUNTER — Encounter: Payer: Self-pay | Admitting: Physician Assistant

## 2019-10-23 ENCOUNTER — Inpatient Hospital Stay: Payer: Medicare Other | Attending: Physician Assistant | Admitting: Physician Assistant

## 2019-10-23 ENCOUNTER — Inpatient Hospital Stay: Payer: Medicare Other

## 2019-10-23 ENCOUNTER — Ambulatory Visit
Admission: RE | Admit: 2019-10-23 | Discharge: 2019-10-23 | Disposition: A | Discharge status: Home / Self Care | Payer: Medicare Other | Source: Ambulatory Visit | Attending: Radiation Oncology | Admitting: Radiation Oncology

## 2019-10-23 ENCOUNTER — Other Ambulatory Visit: Payer: Self-pay

## 2019-10-23 ENCOUNTER — Ambulatory Visit: Payer: Medicare Other

## 2019-10-23 VITALS — BP 132/63 | HR 84 | Temp 97.6°F | Resp 14 | Ht 71.0 in | Wt 200.0 lb

## 2019-10-23 DIAGNOSIS — C3491 Malignant neoplasm of unspecified part of right bronchus or lung: Secondary | ICD-10-CM

## 2019-10-23 DIAGNOSIS — E1122 Type 2 diabetes mellitus with diabetic chronic kidney disease: Secondary | ICD-10-CM | POA: Insufficient documentation

## 2019-10-23 DIAGNOSIS — Z7189 Other specified counseling: Secondary | ICD-10-CM | POA: Insufficient documentation

## 2019-10-23 DIAGNOSIS — Z5111 Encounter for antineoplastic chemotherapy: Secondary | ICD-10-CM | POA: Diagnosis not present

## 2019-10-23 DIAGNOSIS — C3481 Malignant neoplasm of overlapping sites of right bronchus and lung: Secondary | ICD-10-CM

## 2019-10-23 DIAGNOSIS — N183 Chronic kidney disease, stage 3 unspecified: Secondary | ICD-10-CM | POA: Diagnosis not present

## 2019-10-23 DIAGNOSIS — Z510 Encounter for antineoplastic radiation therapy: Secondary | ICD-10-CM | POA: Diagnosis not present

## 2019-10-23 DIAGNOSIS — C3490 Malignant neoplasm of unspecified part of unspecified bronchus or lung: Secondary | ICD-10-CM

## 2019-10-23 DIAGNOSIS — C3431 Malignant neoplasm of lower lobe, right bronchus or lung: Secondary | ICD-10-CM | POA: Insufficient documentation

## 2019-10-23 DIAGNOSIS — C3411 Malignant neoplasm of upper lobe, right bronchus or lung: Secondary | ICD-10-CM | POA: Diagnosis not present

## 2019-10-23 DIAGNOSIS — Z79899 Other long term (current) drug therapy: Secondary | ICD-10-CM | POA: Insufficient documentation

## 2019-10-23 HISTORY — DX: Malignant neoplasm of unspecified part of right bronchus or lung: C34.91

## 2019-10-23 LAB — CBC WITH DIFFERENTIAL (CANCER CENTER ONLY)
Abs Immature Granulocytes: 0.08 10*3/uL — ABNORMAL HIGH (ref 0.00–0.07)
Basophils Absolute: 0 10*3/uL (ref 0.0–0.1)
Basophils Relative: 0 %
Eosinophils Absolute: 0.1 10*3/uL (ref 0.0–0.5)
Eosinophils Relative: 1 %
HCT: 33.6 % — ABNORMAL LOW (ref 39.0–52.0)
Hemoglobin: 10.9 g/dL — ABNORMAL LOW (ref 13.0–17.0)
Immature Granulocytes: 1 %
Lymphocytes Relative: 12 %
Lymphs Abs: 0.9 10*3/uL (ref 0.7–4.0)
MCH: 27.5 pg (ref 26.0–34.0)
MCHC: 32.4 g/dL (ref 30.0–36.0)
MCV: 84.8 fL (ref 80.0–100.0)
Monocytes Absolute: 0.9 10*3/uL (ref 0.1–1.0)
Monocytes Relative: 12 %
Neutro Abs: 5.9 10*3/uL (ref 1.7–7.7)
Neutrophils Relative %: 74 %
Platelet Count: 298 10*3/uL (ref 150–400)
RBC: 3.96 MIL/uL — ABNORMAL LOW (ref 4.22–5.81)
RDW: 12.8 % (ref 11.5–15.5)
WBC Count: 7.9 10*3/uL (ref 4.0–10.5)
nRBC: 0 % (ref 0.0–0.2)

## 2019-10-23 LAB — CMP (CANCER CENTER ONLY)
ALT: 15 U/L (ref 0–44)
AST: 10 U/L — ABNORMAL LOW (ref 15–41)
Albumin: 2.7 g/dL — ABNORMAL LOW (ref 3.5–5.0)
Alkaline Phosphatase: 90 U/L (ref 38–126)
Anion gap: 10 (ref 5–15)
BUN: 22 mg/dL (ref 8–23)
CO2: 29 mmol/L (ref 22–32)
Calcium: 9.1 mg/dL (ref 8.9–10.3)
Chloride: 97 mmol/L — ABNORMAL LOW (ref 98–111)
Creatinine: 1.46 mg/dL — ABNORMAL HIGH (ref 0.61–1.24)
GFR, Est AFR Am: 55 mL/min — ABNORMAL LOW (ref >60)
GFR, Estimated: 47 mL/min — ABNORMAL LOW (ref >60)
Glucose, Bld: 143 mg/dL — ABNORMAL HIGH (ref 70–99)
Potassium: 4 mmol/L (ref 3.5–5.1)
Sodium: 136 mmol/L (ref 135–145)
Total Bilirubin: 0.3 mg/dL (ref 0.3–1.2)
Total Protein: 6.9 g/dL (ref 6.5–8.1)

## 2019-10-23 MED ORDER — PROCHLORPERAZINE MALEATE 10 MG PO TABS: 10.0000 mg | ORAL_TABLET | Freq: Four times a day (QID) | ORAL | 2 refills | Status: DC | PRN

## 2019-10-23 NOTE — Patient Instructions (Signed)
-  There are two main categories of lung cancer, they are named based on the size of the cancer cell. One is called Non-Small cell lung cancer. The other type is Small Cell Lung Cancer -The sample (biopsy) that they took of your tumor was consistent with a subtype of Non-small cell lung cancer. We will call pathology to determine what subtype of non-small cell lung cancer -We covered a lot of important information at your appointment today regarding what the treatment plan is moving forward. Here are the the main points that were discussed at your office visit with [place] today:  -If there is no other area involved outside of the lung (from getting the PET scan). This would be stage IIIC -The treatment that you will receive consists of two chemotherapy drugs called Carboplatin and Paclitaxel (also called Taxol) -We are planning on starting your treatment on _/_/_ but before your start your treatment, I would like you to attend a Chemotherapy Education Class. This involves having you sit down with one of our nurse educators. She will discuss with you one-on-one more details about your treatment as well as general information about resources here at the [facility] treatment will be given every week for about 6 weeks or so (as long as you are also receiving radiation). We will check your labs (blood work) once a week . Dr. [name] or I will see you every other treatment just to make sure that you are doing well and that everything is on track. -We will get a CT scan about 3 weeks after you complete your treatment  Medications:  -Compazine was sent to your pharmacy. This medication is for nausea. You may take this every 6 hours as needed if you feel nausous.   Referrals/Imaging -I have placed an order for a brain MRI.  -Please call radiology scheduling as early as possible to reschedule your PET scan at the earliest availability. The number is [phone]   Follow Up:  -We will see you back for a  follow up visit in __ weeks

## 2019-10-23 NOTE — Progress Notes (Signed)
START ON PATHWAY REGIMEN - Non-Small Cell Lung     Administer weekly:     Paclitaxel      Carboplatin   **Always confirm dose/schedule in your pharmacy ordering system**  Patient Characteristics: Preoperative or Nonsurgical Candidate (Clinical Staging), Stage III - Nonsurgical Candidate (Nonsquamous and Squamous), PS = 0, 1 Therapeutic Status: Preoperative or Nonsurgical Candidate (Clinical Staging) AJCC T Category: cT4 AJCC N Category: cN2 AJCC M Category: cM0 AJCC 8 Stage Grouping: IIIB ECOG Performance Status: 1 Intent of Therapy: Curative Intent, Discussed with Patient

## 2019-10-24 ENCOUNTER — Ambulatory Visit
Admission: RE | Admit: 2019-10-24 | Discharge: 2019-10-24 | Disposition: A | Discharge status: Home / Self Care | Payer: Medicare Other | Source: Ambulatory Visit | Attending: Radiation Oncology | Admitting: Radiation Oncology

## 2019-10-24 ENCOUNTER — Ambulatory Visit: Payer: Medicare Other

## 2019-10-24 ENCOUNTER — Ambulatory Visit: Payer: Medicare Other | Admitting: Internal Medicine

## 2019-10-24 ENCOUNTER — Other Ambulatory Visit: Payer: Self-pay

## 2019-10-24 ENCOUNTER — Telehealth: Payer: Self-pay | Admitting: *Deleted

## 2019-10-24 DIAGNOSIS — Z510 Encounter for antineoplastic radiation therapy: Secondary | ICD-10-CM | POA: Diagnosis not present

## 2019-10-24 DIAGNOSIS — C3411 Malignant neoplasm of upper lobe, right bronchus or lung: Secondary | ICD-10-CM | POA: Diagnosis not present

## 2019-10-24 DIAGNOSIS — C3431 Malignant neoplasm of lower lobe, right bronchus or lung: Secondary | ICD-10-CM | POA: Diagnosis not present

## 2019-10-24 NOTE — Telephone Encounter (Signed)
Oncology Nurse Navigator Documentation  Oncology Nurse Navigator Flowsheets 10/24/2019  Abnormal Finding Date 10/10/2019  Confirmed Diagnosis Date 10/17/2019  Diagnosis Status Confirmed Diagnosis Complete  Planned Course of Treatment Chemo/Radiation Concurrent  Navigator Follow Up Date: 10/26/2019  Navigator Follow Up Reason: Appointment Review  Navigator Location [facility]  Navigator Encounter Type Telephone/[name] is a new patient of Dr. Worthy Flank dx lung cancer.  Patient need to schedule his PET scan.  I called to update [name] on who to call for scan.  I was unable to reach him but did leave vm message with an update on central scheduling phone number and my number to call if needed.   Telephone Outgoing Call  Treatment Phase Treatment  Barriers/Navigation Needs Coordination of Care;Education  Education Other  Interventions Coordination of Care;Education  Acuity Level 2-Minimal Needs (1-2 Barriers Identified)  Coordination of Care Other  Education Method Verbal  Time Spent with Patient 15

## 2019-10-25 ENCOUNTER — Other Ambulatory Visit: Payer: Self-pay

## 2019-10-25 ENCOUNTER — Telehealth: Payer: Self-pay | Admitting: Internal Medicine

## 2019-10-25 ENCOUNTER — Ambulatory Visit
Admission: RE | Admit: 2019-10-25 | Discharge: 2019-10-25 | Disposition: A | Discharge status: Home / Self Care | Payer: Medicare Other | Source: Ambulatory Visit | Attending: Radiation Oncology | Admitting: Radiation Oncology

## 2019-10-25 DIAGNOSIS — Z510 Encounter for antineoplastic radiation therapy: Secondary | ICD-10-CM | POA: Diagnosis not present

## 2019-10-25 DIAGNOSIS — C3411 Malignant neoplasm of upper lobe, right bronchus or lung: Secondary | ICD-10-CM | POA: Diagnosis not present

## 2019-10-25 DIAGNOSIS — C3431 Malignant neoplasm of lower lobe, right bronchus or lung: Secondary | ICD-10-CM | POA: Diagnosis not present

## 2019-10-25 NOTE — Telephone Encounter (Signed)
Scheduled per 07/14 los, patient has been called and notified.

## 2019-10-26 ENCOUNTER — Inpatient Hospital Stay: Payer: Medicare Other

## 2019-10-26 ENCOUNTER — Other Ambulatory Visit: Payer: Self-pay

## 2019-10-26 ENCOUNTER — Ambulatory Visit
Admission: RE | Admit: 2019-10-26 | Discharge: 2019-10-26 | Disposition: A | Discharge status: Home / Self Care | Payer: Medicare Other | Source: Ambulatory Visit | Attending: Radiation Oncology | Admitting: Radiation Oncology

## 2019-10-26 ENCOUNTER — Ambulatory Visit: Payer: Medicare Other

## 2019-10-26 ENCOUNTER — Telehealth: Payer: Self-pay

## 2019-10-26 DIAGNOSIS — C3431 Malignant neoplasm of lower lobe, right bronchus or lung: Secondary | ICD-10-CM | POA: Diagnosis not present

## 2019-10-26 DIAGNOSIS — C3411 Malignant neoplasm of upper lobe, right bronchus or lung: Secondary | ICD-10-CM | POA: Diagnosis not present

## 2019-10-26 DIAGNOSIS — Z510 Encounter for antineoplastic radiation therapy: Secondary | ICD-10-CM | POA: Diagnosis not present

## 2019-10-26 NOTE — Telephone Encounter (Signed)
Per Multidisciplinary clinic, pt needs PET.  They will review his case and schedule appt.

## 2019-10-29 ENCOUNTER — Ambulatory Visit: Payer: Medicare Other

## 2019-10-29 ENCOUNTER — Ambulatory Visit
Admission: RE | Admit: 2019-10-29 | Discharge: 2019-10-29 | Disposition: A | Discharge status: Home / Self Care | Payer: Medicare Other | Source: Ambulatory Visit | Attending: Radiation Oncology | Admitting: Radiation Oncology

## 2019-10-29 ENCOUNTER — Other Ambulatory Visit: Payer: Self-pay

## 2019-10-29 DIAGNOSIS — C3411 Malignant neoplasm of upper lobe, right bronchus or lung: Secondary | ICD-10-CM | POA: Diagnosis not present

## 2019-10-29 DIAGNOSIS — Z510 Encounter for antineoplastic radiation therapy: Secondary | ICD-10-CM | POA: Diagnosis not present

## 2019-10-29 DIAGNOSIS — C3431 Malignant neoplasm of lower lobe, right bronchus or lung: Secondary | ICD-10-CM | POA: Diagnosis not present

## 2019-10-30 ENCOUNTER — Inpatient Hospital Stay: Payer: Medicare Other

## 2019-10-30 ENCOUNTER — Other Ambulatory Visit: Payer: Self-pay

## 2019-10-30 ENCOUNTER — Ambulatory Visit
Admission: RE | Admit: 2019-10-30 | Discharge: 2019-10-30 | Disposition: A | Discharge status: Home / Self Care | Payer: Medicare Other | Source: Ambulatory Visit | Attending: Radiation Oncology | Admitting: Radiation Oncology

## 2019-10-30 ENCOUNTER — Other Ambulatory Visit: Payer: Self-pay | Admitting: Physician Assistant

## 2019-10-30 VITALS — BP 160/76 | HR 96 | Temp 98.3°F | Resp 18 | Wt 198.0 lb

## 2019-10-30 DIAGNOSIS — N183 Chronic kidney disease, stage 3 unspecified: Secondary | ICD-10-CM | POA: Diagnosis not present

## 2019-10-30 DIAGNOSIS — Z510 Encounter for antineoplastic radiation therapy: Secondary | ICD-10-CM | POA: Diagnosis not present

## 2019-10-30 DIAGNOSIS — Z5111 Encounter for antineoplastic chemotherapy: Secondary | ICD-10-CM | POA: Diagnosis not present

## 2019-10-30 DIAGNOSIS — C3491 Malignant neoplasm of unspecified part of right bronchus or lung: Secondary | ICD-10-CM

## 2019-10-30 DIAGNOSIS — IMO0002 Reserved for concepts with insufficient information to code with codable children: Secondary | ICD-10-CM

## 2019-10-30 DIAGNOSIS — Z79899 Other long term (current) drug therapy: Secondary | ICD-10-CM | POA: Diagnosis not present

## 2019-10-30 DIAGNOSIS — C3411 Malignant neoplasm of upper lobe, right bronchus or lung: Secondary | ICD-10-CM | POA: Diagnosis not present

## 2019-10-30 DIAGNOSIS — E1122 Type 2 diabetes mellitus with diabetic chronic kidney disease: Secondary | ICD-10-CM | POA: Diagnosis not present

## 2019-10-30 DIAGNOSIS — C3431 Malignant neoplasm of lower lobe, right bronchus or lung: Secondary | ICD-10-CM | POA: Diagnosis not present

## 2019-10-30 LAB — CMP (CANCER CENTER ONLY)
ALT: 12 U/L (ref 0–44)
AST: 10 U/L — ABNORMAL LOW (ref 15–41)
Albumin: 2.9 g/dL — ABNORMAL LOW (ref 3.5–5.0)
Alkaline Phosphatase: 97 U/L (ref 38–126)
Anion gap: 13 (ref 5–15)
BUN: 19 mg/dL (ref 8–23)
CO2: 26 mmol/L (ref 22–32)
Calcium: 8.9 mg/dL (ref 8.9–10.3)
Chloride: 98 mmol/L (ref 98–111)
Creatinine: 1.56 mg/dL — ABNORMAL HIGH (ref 0.61–1.24)
GFR, Est AFR Am: 50 mL/min — ABNORMAL LOW (ref >60)
GFR, Estimated: 43 mL/min — ABNORMAL LOW (ref >60)
Glucose, Bld: 367 mg/dL — ABNORMAL HIGH (ref 70–99)
Potassium: 4.6 mmol/L (ref 3.5–5.1)
Sodium: 137 mmol/L (ref 135–145)
Total Bilirubin: 0.3 mg/dL (ref 0.3–1.2)
Total Protein: 6.9 g/dL (ref 6.5–8.1)

## 2019-10-30 LAB — CBC WITH DIFFERENTIAL (CANCER CENTER ONLY)
Abs Immature Granulocytes: 0.04 10*3/uL (ref 0.00–0.07)
Basophils Absolute: 0 10*3/uL (ref 0.0–0.1)
Basophils Relative: 1 %
Eosinophils Absolute: 0.1 10*3/uL (ref 0.0–0.5)
Eosinophils Relative: 1 %
HCT: 35.9 % — ABNORMAL LOW (ref 39.0–52.0)
Hemoglobin: 11.6 g/dL — ABNORMAL LOW (ref 13.0–17.0)
Immature Granulocytes: 1 %
Lymphocytes Relative: 11 %
Lymphs Abs: 0.7 10*3/uL (ref 0.7–4.0)
MCH: 26.8 pg (ref 26.0–34.0)
MCHC: 32.3 g/dL (ref 30.0–36.0)
MCV: 82.9 fL (ref 80.0–100.0)
Monocytes Absolute: 0.8 10*3/uL (ref 0.1–1.0)
Monocytes Relative: 11 %
Neutro Abs: 5 10*3/uL (ref 1.7–7.7)
Neutrophils Relative %: 75 %
Platelet Count: 221 10*3/uL (ref 150–400)
RBC: 4.33 MIL/uL (ref 4.22–5.81)
RDW: 12.9 % (ref 11.5–15.5)
WBC Count: 6.6 10*3/uL (ref 4.0–10.5)
nRBC: 0 % (ref 0.0–0.2)

## 2019-10-30 MED ORDER — FAMOTIDINE IN NACL 20-0.9 MG/50ML-% IV SOLN
20.0000 mg | Freq: Once | INTRAVENOUS | Status: AC
  Administered 2019-10-30: 20 mg via INTRAVENOUS

## 2019-10-30 MED ORDER — FAMOTIDINE IN NACL 20-0.9 MG/50ML-% IV SOLN
INTRAVENOUS | Status: AC
  Filled 2019-10-30: qty 50

## 2019-10-30 MED ORDER — DIPHENHYDRAMINE HCL 50 MG/ML IJ SOLN
INTRAMUSCULAR | Status: AC
  Filled 2019-10-30: qty 1

## 2019-10-30 MED ORDER — INSULIN REGULAR HUMAN 100 UNIT/ML IJ SOLN
INTRAMUSCULAR | Status: AC
  Filled 2019-10-30: qty 1

## 2019-10-30 MED ORDER — SODIUM CHLORIDE 0.9 % IV SOLN
45.0000 mg/m2 | Freq: Once | INTRAVENOUS | Status: AC
  Administered 2019-10-30: 96 mg via INTRAVENOUS
  Filled 2019-10-30: qty 16

## 2019-10-30 MED ORDER — SODIUM CHLORIDE 0.9 % IV SOLN
Freq: Once | INTRAVENOUS | Status: AC
  Filled 2019-10-30: qty 250

## 2019-10-30 MED ORDER — DIPHENHYDRAMINE HCL 50 MG/ML IJ SOLN
50.0000 mg | Freq: Once | INTRAMUSCULAR | Status: AC
  Administered 2019-10-30: 50 mg via INTRAVENOUS

## 2019-10-30 MED ORDER — PALONOSETRON HCL INJECTION 0.25 MG/5ML
INTRAVENOUS | Status: AC
  Filled 2019-10-30: qty 5

## 2019-10-30 MED ORDER — INSULIN REGULAR HUMAN 100 UNIT/ML IJ SOLN
10.0000 [IU] | Freq: Once | INTRAMUSCULAR | Status: AC
  Administered 2019-10-30: 10 [IU] via SUBCUTANEOUS

## 2019-10-30 MED ORDER — PALONOSETRON HCL INJECTION 0.25 MG/5ML
0.2500 mg | Freq: Once | INTRAVENOUS | Status: AC
  Administered 2019-10-30: 0.25 mg via INTRAVENOUS

## 2019-10-30 MED ORDER — SODIUM CHLORIDE 0.9 % IV SOLN
165.6000 mg | Freq: Once | INTRAVENOUS | Status: AC
  Administered 2019-10-30: 170 mg via INTRAVENOUS
  Filled 2019-10-30: qty 17

## 2019-10-30 MED ORDER — SODIUM CHLORIDE 0.9 % IV SOLN
20.0000 mg | Freq: Once | INTRAVENOUS | Status: AC
  Administered 2019-10-30: 20 mg via INTRAVENOUS
  Filled 2019-10-30: qty 20

## 2019-10-30 NOTE — Progress Notes (Signed)
Okay to treat-Per [name], it is okay to treat pt today with carboplatin and taxol and creatinine of 1.56 and today's vital signs.

## 2019-10-30 NOTE — Patient Instructions (Signed)
[place] [facility] Discharge Instructions for Patients Receiving Chemotherapy  Today you received the following chemotherapy agents: paclitaxel and carboplatin.  To help prevent nausea and vomiting after your treatment, we encourage you to take your nausea medication as directed.   If you develop nausea and vomiting that is not controlled by your nausea medication, call the clinic.   BELOW ARE SYMPTOMS THAT SHOULD BE REPORTED IMMEDIATELY:  *FEVER GREATER THAN 100.5 F  *CHILLS WITH OR WITHOUT FEVER  NAUSEA AND VOMITING THAT IS NOT CONTROLLED WITH YOUR NAUSEA MEDICATION  *UNUSUAL SHORTNESS OF BREATH  *UNUSUAL BRUISING OR BLEEDING  TENDERNESS IN MOUTH AND THROAT WITH OR WITHOUT PRESENCE OF ULCERS  *URINARY PROBLEMS  *BOWEL PROBLEMS  UNUSUAL RASH Items with * indicate a potential emergency and should be followed up as soon as possible.  Feel free to call the clinic should you have any questions or concerns. The clinic phone number is [phone].  Please show the [place] at check-in to the Emergency Department and triage nurse.  Paclitaxel injection What is this medicine? PACLITAXEL (PAK li TAX el) is a chemotherapy drug. It targets fast dividing cells, like cancer cells, and causes these cells to die. This medicine is used to treat ovarian cancer, breast cancer, lung cancer, Kaposi's sarcoma, and other cancers. This medicine may be used for other purposes; ask your health care provider or pharmacist if you have questions. COMMON BRAND NAME(S): Onxol, Taxol What should I tell my health care provider before I take this medicine? They need to know if you have any of these conditions:  history of irregular heartbeat  liver disease  low blood counts, like low white cell, platelet, or red cell counts  lung or breathing disease, like asthma  tingling of the fingers or toes, or other nerve disorder  an unusual or allergic reaction to paclitaxel, alcohol,  polyoxyethylated castor oil, other chemotherapy, other medicines, foods, dyes, or preservatives  pregnant or trying to get pregnant  breast-feeding How should I use this medicine? This drug is given as an infusion into a vein. It is administered in a hospital or clinic by a specially trained health care professional. Talk to your pediatrician regarding the use of this medicine in children. Special care may be needed. Overdosage: If you think you have taken too much of this medicine contact a poison control center or emergency room at once. NOTE: This medicine is only for you. Do not share this medicine with others. What if I miss a dose? It is important not to miss your dose. Call your doctor or health care professional if you are unable to keep an appointment. What may interact with this medicine? Do not take this medicine with any of the following medications:  disulfiram  metronidazole This medicine may also interact with the following medications:  antiviral medicines for hepatitis, HIV or AIDS  certain antibiotics like erythromycin and clarithromycin  certain medicines for fungal infections like ketoconazole and itraconazole  certain medicines for seizures like carbamazepine, phenobarbital, phenytoin  gemfibrozil  nefazodone  rifampin  St. John's wort This list may not describe all possible interactions. Give your health care provider a list of all the medicines, herbs, non-prescription drugs, or dietary supplements you use. Also tell them if you smoke, drink alcohol, or use illegal drugs. Some items may interact with your medicine. What should I watch for while using this medicine? Your condition will be monitored carefully while you are receiving this medicine. You will need important  blood work done while you are taking this medicine. This medicine can cause serious allergic reactions. To reduce your risk you will need to take other medicine(s) before treatment with this  medicine. If you experience allergic reactions like skin rash, itching or hives, swelling of the face, lips, or tongue, tell your doctor or health care professional right away. In some cases, you may be given additional medicines to help with side effects. Follow all directions for their use. This drug may make you feel generally unwell. This is not uncommon, as chemotherapy can affect healthy cells as well as cancer cells. Report any side effects. Continue your course of treatment even though you feel ill unless your doctor tells you to stop. Call your doctor or health care professional for advice if you get a fever, chills or sore throat, or other symptoms of a cold or flu. Do not treat yourself. This drug decreases your body's ability to fight infections. Try to avoid being around people who are sick. This medicine may increase your risk to bruise or bleed. Call your doctor or health care professional if you notice any unusual bleeding. Be careful brushing and flossing your teeth or using a toothpick because you may get an infection or bleed more easily. If you have any dental work done, tell your dentist you are receiving this medicine. Avoid taking products that contain aspirin, acetaminophen, ibuprofen, naproxen, or ketoprofen unless instructed by your doctor. These medicines may hide a fever. Do not become pregnant while taking this medicine. Women should inform their doctor if they wish to become pregnant or think they might be pregnant. There is a potential for serious side effects to an unborn child. Talk to your health care professional or pharmacist for more information. Do not breast-feed an infant while taking this medicine. Men are advised not to father a child while receiving this medicine. This product may contain alcohol. Ask your pharmacist or healthcare provider if this medicine contains alcohol. Be sure to tell all healthcare providers you are taking this medicine. Certain medicines,  like metronidazole and disulfiram, can cause an unpleasant reaction when taken with alcohol. The reaction includes flushing, headache, nausea, vomiting, sweating, and increased thirst. The reaction can last from 30 minutes to several hours. What side effects may I notice from receiving this medicine? Side effects that you should report to your doctor or health care professional as soon as possible:  allergic reactions like skin rash, itching or hives, swelling of the face, lips, or tongue  breathing problems  changes in vision  fast, irregular heartbeat  high or low blood pressure  mouth sores  pain, tingling, numbness in the hands or feet  signs of decreased platelets or bleeding - bruising, pinpoint red spots on the skin, black, tarry stools, blood in the urine  signs of decreased red blood cells - unusually weak or tired, feeling faint or lightheaded, falls  signs of infection - fever or chills, cough, sore throat, pain or difficulty passing urine  signs and symptoms of liver injury like dark yellow or brown urine; general ill feeling or flu-like symptoms; light-colored stools; loss of appetite; nausea; right upper belly pain; unusually weak or tired; yellowing of the eyes or skin  swelling of the ankles, feet, hands  unusually slow heartbeat Side effects that usually do not require medical attention (report to your doctor or health care professional if they continue or are bothersome):  diarrhea  hair loss  loss of appetite  muscle or joint  pain  nausea, vomiting  pain, redness, or irritation at site where injected  tiredness This list may not describe all possible side effects. Call your doctor for medical advice about side effects. You may report side effects to FDA at 1-800-FDA-1088. Where should I keep my medicine? This drug is given in a hospital or clinic and will not be stored at home. NOTE: This sheet is a summary. It may not cover all possible information.  If you have questions about this medicine, talk to your doctor, pharmacist, or health care provider.  2020 Elsevier/Gold Standard (2016-11-30 13:14:55)  Carboplatin injection What is this medicine? CARBOPLATIN (KAR boe pla tin) is a chemotherapy drug. It targets fast dividing cells, like cancer cells, and causes these cells to die. This medicine is used to treat ovarian cancer and many other cancers. This medicine may be used for other purposes; ask your health care provider or pharmacist if you have questions. COMMON BRAND NAME(S): Paraplatin What should I tell my health care provider before I take this medicine? They need to know if you have any of these conditions:  blood disorders  hearing problems  kidney disease  recent or ongoing radiation therapy  an unusual or allergic reaction to carboplatin, cisplatin, other chemotherapy, other medicines, foods, dyes, or preservatives  pregnant or trying to get pregnant  breast-feeding How should I use this medicine? This drug is usually given as an infusion into a vein. It is administered in a hospital or clinic by a specially trained health care professional. Talk to your pediatrician regarding the use of this medicine in children. Special care may be needed. Overdosage: If you think you have taken too much of this medicine contact a poison control center or emergency room at once. NOTE: This medicine is only for you. Do not share this medicine with others. What if I miss a dose? It is important not to miss a dose. Call your doctor or health care professional if you are unable to keep an appointment. What may interact with this medicine?  medicines for seizures  medicines to increase blood counts like filgrastim, pegfilgrastim, sargramostim  some antibiotics like amikacin, gentamicin, neomycin, streptomycin, tobramycin  vaccines Talk to your doctor or health care professional before taking any of these  medicines:  acetaminophen  aspirin  ibuprofen  ketoprofen  naproxen This list may not describe all possible interactions. Give your health care provider a list of all the medicines, herbs, non-prescription drugs, or dietary supplements you use. Also tell them if you smoke, drink alcohol, or use illegal drugs. Some items may interact with your medicine. What should I watch for while using this medicine? Your condition will be monitored carefully while you are receiving this medicine. You will need important blood work done while you are taking this medicine. This drug may make you feel generally unwell. This is not uncommon, as chemotherapy can affect healthy cells as well as cancer cells. Report any side effects. Continue your course of treatment even though you feel ill unless your doctor tells you to stop. In some cases, you may be given additional medicines to help with side effects. Follow all directions for their use. Call your doctor or health care professional for advice if you get a fever, chills or sore throat, or other symptoms of a cold or flu. Do not treat yourself. This drug decreases your body's ability to fight infections. Try to avoid being around people who are sick. This medicine may increase your risk to bruise  or bleed. Call your doctor or health care professional if you notice any unusual bleeding. Be careful brushing and flossing your teeth or using a toothpick because you may get an infection or bleed more easily. If you have any dental work done, tell your dentist you are receiving this medicine. Avoid taking products that contain aspirin, acetaminophen, ibuprofen, naproxen, or ketoprofen unless instructed by your doctor. These medicines may hide a fever. Do not become pregnant while taking this medicine. Women should inform their doctor if they wish to become pregnant or think they might be pregnant. There is a potential for serious side effects to an unborn child. Talk  to your health care professional or pharmacist for more information. Do not breast-feed an infant while taking this medicine. What side effects may I notice from receiving this medicine? Side effects that you should report to your doctor or health care professional as soon as possible:  allergic reactions like skin rash, itching or hives, swelling of the face, lips, or tongue  signs of infection - fever or chills, cough, sore throat, pain or difficulty passing urine  signs of decreased platelets or bleeding - bruising, pinpoint red spots on the skin, black, tarry stools, nosebleeds  signs of decreased red blood cells - unusually weak or tired, fainting spells, lightheadedness  breathing problems  changes in hearing  changes in vision  chest pain  high blood pressure  low blood counts - This drug may decrease the number of white blood cells, red blood cells and platelets. You may be at increased risk for infections and bleeding.  nausea and vomiting  pain, swelling, redness or irritation at the injection site  pain, tingling, numbness in the hands or feet  problems with balance, talking, walking  trouble passing urine or change in the amount of urine Side effects that usually do not require medical attention (report to your doctor or health care professional if they continue or are bothersome):  hair loss  loss of appetite  metallic taste in the mouth or changes in taste This list may not describe all possible side effects. Call your doctor for medical advice about side effects. You may report side effects to FDA at 1-800-FDA-1088. Where should I keep my medicine? This drug is given in a hospital or clinic and will not be stored at home. NOTE: This sheet is a summary. It may not cover all possible information. If you have questions about this medicine, talk to your doctor, pharmacist, or health care provider.  2020 Elsevier/Gold Standard (2007-07-04 14:38:05)

## 2019-10-31 ENCOUNTER — Encounter (HOSPITAL_COMMUNITY)
Admission: RE | Admit: 2019-10-31 | Discharge: 2019-10-31 | Disposition: A | Discharge status: Home / Self Care | Payer: Medicare Other | Source: Ambulatory Visit | Attending: Family Medicine | Admitting: Family Medicine

## 2019-10-31 ENCOUNTER — Ambulatory Visit
Admission: RE | Admit: 2019-10-31 | Discharge: 2019-10-31 | Disposition: A | Discharge status: Home / Self Care | Payer: Medicare Other | Source: Ambulatory Visit | Attending: Radiation Oncology | Admitting: Radiation Oncology

## 2019-10-31 ENCOUNTER — Other Ambulatory Visit: Payer: Self-pay

## 2019-10-31 DIAGNOSIS — C3431 Malignant neoplasm of lower lobe, right bronchus or lung: Secondary | ICD-10-CM | POA: Diagnosis not present

## 2019-10-31 DIAGNOSIS — C3411 Malignant neoplasm of upper lobe, right bronchus or lung: Secondary | ICD-10-CM | POA: Diagnosis not present

## 2019-10-31 DIAGNOSIS — Z510 Encounter for antineoplastic radiation therapy: Secondary | ICD-10-CM | POA: Diagnosis not present

## 2019-10-31 DIAGNOSIS — R918 Other nonspecific abnormal finding of lung field: Secondary | ICD-10-CM | POA: Insufficient documentation

## 2019-10-31 DIAGNOSIS — C3490 Malignant neoplasm of unspecified part of unspecified bronchus or lung: Secondary | ICD-10-CM

## 2019-10-31 DIAGNOSIS — C3491 Malignant neoplasm of unspecified part of right bronchus or lung: Secondary | ICD-10-CM | POA: Insufficient documentation

## 2019-11-01 ENCOUNTER — Other Ambulatory Visit: Payer: Self-pay

## 2019-11-01 ENCOUNTER — Ambulatory Visit (HOSPITAL_COMMUNITY)
Admission: RE | Admit: 2019-11-01 | Discharge: 2019-11-01 | Disposition: A | Discharge status: Home / Self Care | Payer: Medicare Other | Source: Ambulatory Visit | Attending: Family Medicine | Admitting: Family Medicine

## 2019-11-01 ENCOUNTER — Ambulatory Visit
Admission: RE | Admit: 2019-11-01 | Discharge: 2019-11-01 | Disposition: A | Discharge status: Home / Self Care | Payer: Medicare Other | Source: Ambulatory Visit | Attending: Radiation Oncology | Admitting: Radiation Oncology

## 2019-11-01 DIAGNOSIS — I700 Atherosclerosis of aorta: Secondary | ICD-10-CM | POA: Insufficient documentation

## 2019-11-01 DIAGNOSIS — C3491 Malignant neoplasm of unspecified part of right bronchus or lung: Secondary | ICD-10-CM | POA: Diagnosis not present

## 2019-11-01 DIAGNOSIS — C3411 Malignant neoplasm of upper lobe, right bronchus or lung: Secondary | ICD-10-CM | POA: Diagnosis not present

## 2019-11-01 DIAGNOSIS — C3490 Malignant neoplasm of unspecified part of unspecified bronchus or lung: Secondary | ICD-10-CM | POA: Diagnosis not present

## 2019-11-01 DIAGNOSIS — R918 Other nonspecific abnormal finding of lung field: Secondary | ICD-10-CM | POA: Diagnosis not present

## 2019-11-01 DIAGNOSIS — Z510 Encounter for antineoplastic radiation therapy: Secondary | ICD-10-CM | POA: Diagnosis not present

## 2019-11-01 DIAGNOSIS — J439 Emphysema, unspecified: Secondary | ICD-10-CM | POA: Diagnosis not present

## 2019-11-01 DIAGNOSIS — C3431 Malignant neoplasm of lower lobe, right bronchus or lung: Secondary | ICD-10-CM | POA: Diagnosis not present

## 2019-11-01 LAB — GLUCOSE, CAPILLARY
Glucose-Capillary: 300 mg/dL — ABNORMAL HIGH (ref 70–99)
Glucose-Capillary: 331 mg/dL — ABNORMAL HIGH (ref 70–99)
Glucose-Capillary: 342 mg/dL — ABNORMAL HIGH (ref 70–99)

## 2019-11-02 ENCOUNTER — Telehealth: Payer: Self-pay | Admitting: *Deleted

## 2019-11-02 ENCOUNTER — Ambulatory Visit
Admission: RE | Admit: 2019-11-02 | Discharge: 2019-11-02 | Disposition: A | Discharge status: Home / Self Care | Payer: Medicare Other | Source: Ambulatory Visit | Attending: Radiation Oncology | Admitting: Radiation Oncology

## 2019-11-02 ENCOUNTER — Other Ambulatory Visit: Payer: Self-pay | Admitting: Radiation Oncology

## 2019-11-02 DIAGNOSIS — Z510 Encounter for antineoplastic radiation therapy: Secondary | ICD-10-CM | POA: Diagnosis not present

## 2019-11-02 DIAGNOSIS — C3431 Malignant neoplasm of lower lobe, right bronchus or lung: Secondary | ICD-10-CM | POA: Diagnosis not present

## 2019-11-02 DIAGNOSIS — C3411 Malignant neoplasm of upper lobe, right bronchus or lung: Secondary | ICD-10-CM | POA: Diagnosis not present

## 2019-11-02 LAB — CYTOLOGY - NON PAP

## 2019-11-02 MED ORDER — SUCRALFATE 1 G PO TABS
1.0000 g | ORAL_TABLET | Freq: Four times a day (QID) | ORAL | 2 refills | Status: DC
Start: 2019-11-02 — End: 2020-03-19

## 2019-11-02 MED FILL — Dexamethasone Sodium Phosphate Inj 100 MG/10ML: INTRAMUSCULAR | Qty: 2 | Status: AC

## 2019-11-02 NOTE — Telephone Encounter (Signed)
-----   Message from Ardeen Garland, RN sent at 11/01/2019  4:49 PM EDT ----- Regarding: FW: Dr [name] 1st chemo follow-up Can you call him tomorrow ----- Message ----- From: Bo Mcclintock, RN Sent: 10/30/2019  12:18 PM EDT To: [facility] Triage Nurse [facility] Subject: Dr [name] 1st chemo follow-up                 Dr [name] patient. First chemo follow-up. Received taxol/carbo.

## 2019-11-02 NOTE — Progress Notes (Signed)
[facility] OFFICE PROGRESS NOTE  [name], MD [place] [address]  DIAGNOSIS:   Limited stage small cell lung cancer. He presented with a large right lower lobe/right hilar mass with obstruction of the right lower lobe bronchus.  The patient also has associated bulky right lower paratracheal and subcarinal adenopathy.  He was diagnosed in July 2021.  PRIOR THERAPY:  1) weekly concurrent chemoradiation with carboplatin for an AUC of 2 and paclitaxel 45 mg/m.  First dose 10/29/2019.  Status post 1 cycle. Discontinued due to change in diagnosis.   CURRENT THERAPY: Systemic chemotherapy with carboplatin for an AUC of 5 on day 1 and etoposide 100 mg per metered squared on days 1, 2, and 3 IV every 3 weeks with Neulasta support.  First dose expected on 11/12/2019.  Undergoing concurrent radiation under the care of Dr. Lisbeth Renshaw for the obstructive lung mass.  INTERVAL HISTORY: [name] 74 y.o. male returns to clinic today for a follow-up visit.  The patient is feeling fairly well today without any concerning complaints.  The patient has been undergoing radiotherapy to the obstructive lung mass and has been tolerating radiation fairly well except for mild odynophagia for which she was recently prescribed Carafate.  The patient had his first dose of carboplatin and paclitaxel last week and he tolerated it well without any concerning complaints.  Today, he denies any fever or chills.   He had night sweats on one occasion.  He has lost approximately 8 pounds since his last appointment .  Patient states that he feels hungry and has been eating a lot recently.  The patient occasionally has a mild achiness in his left rib cage which may be secondary to his subacute rib fractures.  He reports very mild shortness of breath.  He reports his baseline cough which produces clear sputum.  Occasionally he has blood-tinged sputum.  Denies any vomiting, diarrhea, or  constipation.  Endorses very mild nausea which is completely controlled with his antiemetic.  Denies any headache or visual changes.  The patient recently had a restaging PET scan and brain MRI.  Pathology also recently reviewed his case and has made an addendum to his pathology report.  The patient is here today for evaluation and for more detailed discussion about his current condition and treatment options.  MEDICAL HISTORY: Past Medical History:  Diagnosis Date  . Allergic rhinitis due to pollen   . Cancer ([place])   . Chronic kidney disease, stage III (moderate) 12/19/2012  . Depression   . Diabetes mellitus ([place])   . Diverticulosis   . Erectile dysfunction associated with type 2 diabetes mellitus ([place]) 05/28/2015  . Former very heavy cigarette smoker (more than 40 per day) 10/07/2014  . GERD (gastroesophageal reflux disease)   . Gout   . Hyperlipidemia   . Hypertension   . Hypogonadism male 06/14/2012  . Iron deficiency   . Memory loss   . Osteoarthritis   . Personal history of colonic polyps    1996    ALLERGIES:  is allergic to tetracycline.  MEDICATIONS:  Current Outpatient Medications  Medication Sig Dispense Refill  . acetaminophen (QC ACETAMINOPHEN 8HR ARTH PAIN) 650 MG CR tablet Take 650 mg by mouth every 8 (eight) hours as needed for pain.    . Ascorbic Acid (VITAMIN C) 1000 MG tablet Take 1,000 mg by mouth daily.    [name] Kitchen atenolol (TENORMIN) 50 MG tablet TAKE 1 TABLET BY MOUTH EVERY DAY (  Patient taking differently: Take 50 mg by mouth daily. ) 90 tablet 1  . cholecalciferol (VITAMIN D) 1000 UNITS tablet Take 1,000 Units by mouth daily.      [name] Kitchen donepezil (ARICEPT) 10 MG tablet TAKE 1 TABLET BY MOUTH AT BEDTIME (Patient taking differently: Take 10 mg by mouth at bedtime. ) 90 tablet 3  . doxazosin (CARDURA) 8 MG tablet TAKE ONE-HALF TABLET AT BEDTIME (Patient taking differently: Take 4 mg by mouth daily. ) 45 tablet 3  . fluticasone (FLONASE) 50 MCG/ACT nasal spray PLACE 2 SPRAYS  IN EACH NOSTRIL DAILY (Patient taking differently: Place 2 sprays into both nostrils daily as needed for allergies or rhinitis. ) 48 mL 3  . guaiFENesin-dextromethorphan (ROBITUSSIN DM) 100-10 MG/5ML syrup Take 5 mLs by mouth every 6 (six) hours as needed for cough. 118 mL 0  . Hyprom-Naphaz-Polysorb-Zn Sulf (CLEAR EYES COMPLETE OP) Place 1 drop into both eyes daily as needed (dry eyes).    . Insulin Pen Needle (PEN NEEDLES) 31G X 5 MM MISC 1 each by Does not apply route 3 (three) times daily. To inject insulin E11.42 90 each 2  . insulin regular human CONCENTRATED (HUMULIN R U-500 KWIKPEN) 500 UNIT/ML kwikpen Inject under skin 80-90 units before b'fast, 60 units before lunch, 80-90 units vefore dinner (Patient taking differently: Inject 20-40 Units into the skin See admin instructions. Takes 35-40 units before breakfast, 20 units at lunch and 35-40 units at night) 6 pen 5  . lisinopril (ZESTRIL) 10 MG tablet TAKE 1 TABLET BY MOUTH EVERY DAY (Patient taking differently: Take 10 mg by mouth daily. ) 90 tablet 3  . memantine (NAMENDA) 10 MG tablet TAKE 1 TABLET BY MOUTH TWICE A DAY (Patient taking differently: Take 10 mg by mouth 2 (two) times daily. ) 180 tablet 3  . montelukast (SINGULAIR) 10 MG tablet Take 1 tablet (10 mg total) by mouth at bedtime. 30 tablet 3  . omeprazole (PRILOSEC) 40 MG capsule Take 1 capsule (40 mg total) by mouth 2 (two) times daily before a meal. (Patient taking differently: Take 40 mg by mouth daily. ) 180 capsule 1  . prochlorperazine (COMPAZINE) 10 MG tablet Take 1 tablet (10 mg total) by mouth every 6 (six) hours as needed. 30 tablet 2  . simvastatin (ZOCOR) 40 MG tablet TAKE 1 TABLET BY MOUTH AT BEDTIME (Patient taking differently: Take 40 mg by mouth daily. ) 90 tablet 2  . sucralfate (CARAFATE) 1 g tablet Take 1 tablet (1 g total) by mouth 4 (four) times daily. Dissolve each tablet in 15 cc water before use. 120 tablet 2  . venlafaxine XR (EFFEXOR-XR) 75 MG 24 hr capsule  TAKE 1 CAPSULE BY MOUTH EVERY MORNING AND 2 CAPSULES AT BEDTIME (Patient taking differently: Take 75 mg by mouth See admin instructions. Takes 1 capsule by mouth in the morning and 2 capsules at night.) 270 capsule 1   No current facility-administered medications for this visit.    SURGICAL HISTORY:  Past Surgical History:  Procedure Laterality Date  . BRONCHIAL NEEDLE ASPIRATION BIOPSY  10/17/2019   Procedure: BRONCHIAL NEEDLE ASPIRATION BIOPSIES;  Surgeon: Juanito Doom, MD;  Location: [facility] ENDOSCOPY;  Service: Cardiopulmonary;;  . CARDIAC CATHETERIZATION  11/2011   [facility]  . CHOLECYSTECTOMY  1980  . ENDOBRONCHIAL ULTRASOUND Bilateral 10/17/2019   Procedure: ENDOBRONCHIAL ULTRASOUND;  Surgeon: Juanito Doom, MD;  Location: [facility] ENDOSCOPY;  Service: Cardiopulmonary;  Laterality: Bilateral;  . ESOPHAGOGASTRODUODENOSCOPY (EGD) WITH PROPOFOL N/A 09/27/2017   Procedure: ESOPHAGOGASTRODUODENOSCOPY (  EGD) WITH PROPOFOL;  Surgeon: Lin Landsman, MD;  Location[facility] ENDOSCOPY;  Service: Gastroenterology;  Laterality: N/A;  . EYE SURGERY Right 07/29/2016   [facility] - Dr. Manuella Ghazi  . EYE SURGERY Left 08/23/2106   [facility]aract - Dr. Manuella Ghazi  . HEMOSTASIS CONTROL  10/17/2019   Procedure: HEMOSTASIS CONTROL;  Surgeon: Juanito Doom, MD;  Location: [facility];  Service: Cardiopulmonary;;  . TOTAL HIP ARTHROPLASTY  2009   Dr. Gladstone Lighter  . TOTAL HIP ARTHROPLASTY      REVIEW OF SYSTEMS:   Review of Systems  Constitutional: Positive for fatigue and unexpected weight loss.  Negative for appetite change, chills, and fever HENT: Positive for mild odynophagia.  Negative for mouth sores, nosebleeds, sore throat and trouble swallowing.   Eyes: Negative for eye problems and icterus.  Respiratory: Positive for baseline cough, shortness of breath with exertion, and  hemoptysis (none at this time) Negative for wheezing.   Cardiovascular: Negative for chest pain and leg swelling.  Gastrointestinal: Positive for  mild nausea. negative for abdominal pain, constipation, diarrhea, and vomiting.  Genitourinary: Negative for bladder incontinence, difficulty urinating, dysuria, frequency and hematuria.   Musculoskeletal: Negative for back pain, gait problem, neck pain and neck stiffness.  Skin: Negative for itching and rash.  Neurological: Negative for dizziness, extremity weakness, gait problem, headaches, light-headedness and seizures.  Hematological: Negative for adenopathy. Does not bruise/bleed easily.  Psychiatric/Behavioral: Negative for confusion, depression and sleep disturbance. The patient is not nervous/anxious.     PHYSICAL EXAMINATION:  Blood pressure (!) 101/59, pulse 98, temperature (!) 97.5 F (36.4 C), temperature source Temporal, resp. rate 16, weight 192 lb 3.2 oz (87.2 kg), SpO2 99 %.  ECOG PERFORMANCE STATUS: 1 - Symptomatic but completely ambulatory  Physical Exam  Constitutional: Oriented to person, place, and time and well-developed, well-nourished, and in no distress.  HENT:  Head: Normocephalic and atraumatic.  Mouth/Throat: Oropharynx is clear and moist. No oropharyngeal exudate.  Eyes: Conjunctivae are normal. Right eye exhibits no discharge. Left eye exhibits no discharge. No scleral icterus.  Neck: Normal range of motion. Neck supple.  Cardiovascular: Normal rate, regular rhythm, normal heart sounds and intact distal pulses.   Pulmonary/Chest: Effort normal and breath sounds normal. No respiratory distress. No wheezes. No rales.  Abdominal: Soft. Bowel sounds are normal. Exhibits no distension and no mass. There is no tenderness.  Musculoskeletal: Normal range of motion. Exhibits no edema.  Lymphadenopathy:    No cervical adenopathy.  Neurological: Alert and oriented to person, place, and time. Exhibits normal muscle tone.  Ambulates with a walker Skin: Skin is warm and dry. No rash noted. Not diaphoretic. No erythema. No pallor.  Psychiatric: Mood, memory and judgment  normal.  Vitals reviewed.  LABORATORY DATA: Lab Results  Component Value Date   WBC 7.5 11/05/2019   HGB 12.4 (L) 11/05/2019   HCT 38.4 (L) 11/05/2019   MCV 82.9 11/05/2019   PLT 195 11/05/2019      Chemistry      Component Value Date/Time   NA 134 (L) 11/05/2019 0739   NA 130 (L) 11/25/2011 1119   NA 137 11/16/2011 0513   K 4.9 11/05/2019 0739   K 4.1 11/16/2011 0513   CL 99 11/05/2019 0739   CL 100 11/16/2011 0513   CO2 23 11/05/2019 0739   CO2 29 11/16/2011 0513   BUN 29 (H) 11/05/2019 0739   BUN 25 11/25/2011 1119   BUN 15 11/16/2011 0513   CREATININE 1.63 (H) 11/05/2019 1093  CREATININE 1.26 11/16/2011 0513      Component Value Date/Time   CALCIUM 9.5 11/05/2019 0739   CALCIUM 9.1 11/16/2011 0513   ALKPHOS 92 11/05/2019 0739   AST 9 (L) 11/05/2019 0739   ALT 10 11/05/2019 0739   BILITOT 0.4 11/05/2019 0739       RADIOGRAPHIC STUDIES:  DG Chest 2 View  Result Date: 10/12/2019 CLINICAL DATA:  Hemoptysis, fall EXAM: CHEST - 2 VIEW COMPARISON:  Chest x-ray 10/10/2019, CT chest 10/11/2019 FINDINGS: No pleural effusion or pneumothorax. No interval consolidation. Similar right hilar mass and adjacent adenopathy. Normal heart size. No pneumothorax. Degenerative changes of the spine. Rounded metallic density superiorly on lateral view appears external to the patient on frontal view. IMPRESSION: Similar right hilar mass and adjacent adenopathy. No acute interval changes. Electronically Signed   By: Donavan Foil M.D.   On: 10/12/2019 17:48   DG Ribs Unilateral W/Chest Left  Result Date: 10/10/2019 CLINICAL DATA:  Pain following fall EXAM: LEFT RIBS AND CHEST - 3+ VIEW COMPARISON:  August 01, 2018 FINDINGS: Frontal chest as well as oblique and cone-down rib images were obtained. There is a mass in the right perihilar region measuring 5.7 x 4.5 cm. There is suspected adenopathy in the right hilum and azygos regions. Lungs elsewhere are clear. Heart size and pulmonary  vascularity are normal. No adenopathy. There is no evident pneumothorax or pleural effusion. No evident rib fracture. There is apparent soft tissue prominence in the left anterior hemithorax. IMPRESSION: 1.  No evident rib fracture. 2. Apparent mass in or overlying the right perihilar region measuring 5.7 x 4.5 cm with probable adenopathy in the right hilum and azygos regions. Advise correlation with contrast enhanced chest CT to further evaluate. 3. Soft tissue prominence anterior left hemithorax of uncertain etiology. Particular attention this area on CT advised. These results will be called to the ordering clinician or representative by the Radiologist Assistant, and communication documented in the PACS or [employer]. Electronically Signed   By: Lowella Grip III M.D.   On: 10/10/2019 11:44   CT Chest W Contrast  Result Date: 10/11/2019 CLINICAL DATA:  Abnormal chest x-ray with RIGHT hilar mass EXAM: CT CHEST WITH CONTRAST TECHNIQUE: Multidetector CT imaging of the chest was performed during intravenous contrast administration. CONTRAST:  75mL OMNIPAQUE IOHEXOL 300 MG/ML  SOLN COMPARISON:  Chest radiograph 10/10/2018 FINDINGS: Cardiovascular: No significant vascular findings. Normal heart size. No pericardial effusion. Mediastinum/Nodes: No axillary supraclavicular adenopathy. The small benign appearing cyst in the LEFT lobe of thyroid gland measuring 10 mm. Bulky RIGHT paratracheal adenopathy. RIGHT lower paratracheal lymph node measures 32 mm short axis. Smaller high RIGHT paratracheal lymph nodes measuring 6 mm on image 23/2. Large subcarinal lymph node measuring 21 mm. Large hilar /perihilar mass posterior to the RIGHT hilum along the bronchus intermedius and RIGHT lower lobe bronchus measures 5.5 by 5.6 cm and demonstrates uniform enhancement. Smaller pleural base nodule measuring 2.5 cm posterior to the mass on image 53/2. Additional peripheral nodule in the RIGHT lower lobe along the pleural  surface measures 13 mm on image 76/3. Lungs/Pleura: RIGHT lower lobe/RIGHT hilar mass described in the mediastinal section. Mass does span the oblique fissure into the RIGHT upper lobe seen on sagittal imaging Upper Abdomen: Limited view of the liver, kidneys, pancreas are unremarkable. Normal adrenal glands. Musculoskeletal: No aggressive osseous lesion IMPRESSION: 1. Large RIGHT lower lobe/RIGHT hilar mass most consistent with primary bronchogenic carcinoma (consider small cell lung cancer). Mass does across the RIGHT  oblique fissure into the RIGHT upper lobe. Mass also narrows the RIGHT lower lobe bronchus. 2. Bulky RIGHT lower paratracheal and subcarinal adenopathy. Smaller metastatic nodes in the high RIGHT paratracheal nodal station. 3. No evidence of supraclavicular nodal metastasis. 4. Two peripheral nodules in the RIGHT lower lobe likely metastatic deposits or primary lesion. 5. If multidisciplinary follow up management is desired, this is available in the [facility] through the [facility] [id number]. These results will be called to the ordering clinician or representative by the Radiologist Assistant, and communication documented in the PACS or [employer]. Electronically Signed   By: Suzy Bouchard M.D.   On: 10/11/2019 15:40   MR Brain W Wo Contrast  Result Date: 11/04/2019 CLINICAL DATA:  Non-small cell lung carcinoma staging EXAM: MRI HEAD WITHOUT AND WITH CONTRAST TECHNIQUE: Multiplanar, multiecho pulse sequences of the brain and surrounding structures were obtained without and with intravenous contrast. CONTRAST:  58mL GADAVIST GADOBUTROL 1 MMOL/ML IV SOLN COMPARISON:  None. FINDINGS: BRAIN: No acute infarct, acute hemorrhage or extra-axial collection. Multifocal white matter hyperintensity, most commonly due to chronic ischemic microangiopathy. There is generalized atrophy without lobar predilection. No chronic microhemorrhage. Normal midline structures.  There is no abnormal contrast enhancement. VASCULAR: Major flow voids are preserved. SKULL AND UPPER CERVICAL SPINE: Normal calvarium and skull base. Visualized upper cervical spine and soft tissues are normal. SINUSES/ORBITS: No paranasal sinus fluid levels or advanced mucosal thickening. No mastoid or middle ear effusion. Normal orbits. IMPRESSION: 1. No intracranial metastatic disease. 2. Generalized atrophy and chronic ischemic microangiopathy. Electronically Signed   By: Ulyses Jarred M.D.   On: 11/04/2019 20:56   NM PET Image Initial (PI) Skull Base To Thigh  Result Date: 11/01/2019 CLINICAL DATA:  Initial treatment strategy for lung cancer. EXAM: NUCLEAR MEDICINE PET SKULL BASE TO THIGH TECHNIQUE: 9.9 mCi F-18 FDG was injected intravenously. Full-ring PET imaging was performed from the skull base to thigh after the radiotracer. CT data was obtained and used for attenuation correction and anatomic localization. Fasting blood glucose: 342 mg/dl COMPARISON:  10/11/2019 FINDINGS: Mediastinal blood pool activity: SUV max 3.42 Liver activity: SUV max NA Note: Patient presents today with abnormally elevated fasting blood glucose at 342. Exam was attempted yesterday however the fasting blood glucose was even higher. Patient reports difficulty with achieving a normal blood glucose level and therefore the decision was made to proceed with the exam knowing that a markedly elevated fasting blood sugar may reduce sensitivity of this exam as the radiotracer is an analog of blood glucose. Neck: No FDG avid mass or adenopathy identified. Incidental CT findings: None CHEST: Superior segment of right lower lobe perihilar lung mass is again noted. The mass appears internally necrotic with a maximum dimension of 6.4 cm with SUV max of 4.6, image 76/4. The tumor invades the mediastinum and has mass effect the posterior wall of the right mainstem bronchus. Encasement and narrowing of the proximal right upper lobe bronchus and  bronchus intermedius noted. Signs of postobstructive pneumonitis versus lymphangitic spread of disease is noted within the posteromedial right upper lobe, image 67/4. Small subpleural nodule is identified within the posterior aspect of the superior segment of right lower lobe measuring 10 mm. Mild FDG uptake with SUV max of 1.3 is associated with this nodule, image 84/4. Sub solid, cystic lesion within the lateral right lung base measures approximately 1 cm without significant FDG uptake. No suspicious pulmonary nodule or mass within the contralateral lung. Large subcarinal lymph node has  a short axis of 2.3 cm and an SUV max of 3.7. Right paratracheal lymph node measures 2.8 cm and appears predominantly necrotic within SUV max of 2.7. Adjacent right paratracheal lymph node measures 1.5 cm with SUV max of 3.7. No left-sided mediastinal or hilar adenopathy. No FDG avid supraclavicular or axillary lymph nodes. Incidental CT findings: Aortic atherosclerosis. Lad and RCA coronary artery calcifications. ABDOMEN/PELVIS: No FDG uptake within the liver, pancreas, spleen, or adrenal glands. No FDG avid lymph nodes within the abdomen or pelvis. Incidental CT findings: Cholecystectomy. Aortic atherosclerosis. 3 mm stone is identified within upper pole of left kidney. 3 cm cyst arises from the lateral cortex of the right kidney. SKELETON: No focal hypermetabolic activity to suggest skeletal metastasis. Incidental CT findings: Previous right hip arthroplasty. Posttraumatic rib deformities are noted involving the anterior aspect of the left third and fourth ribs with expected mild FDG uptake. IMPRESSION: 1. Note: Patient presented for the second day in a row with markedly elevated fasting blood sugars. The radiotracer is a glucose analog and ideally the exam should be performed with a normal fasting blood glucose level as elevated blood glucose may diminish the sensitivity for detecting FDG avid disease. 2. FDG avid necrotic  perihilar lung mass involving the superior segment of right lower lobe is identified compatible with primary bronchogenic carcinoma. 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 Imaging findings are compatible with at least a T3N2M0 lesion, stage IIIb disease. 3. Involvement of the right mainstem bronchus, right upper lobe bronchi and bronchus intermedius noted. Postobstructive pneumonitis versus lymphangitic spread of disease is noted within the central portion of the posteromedial right upper lobe. 4. Enlarged FDG avid lymph nodes are identified within the right paratracheal nodal chain and subcarinal region. Dominant right paratracheal lymph node is centrally necrotic with expected decreased FDG uptake. 5. There are 2 small subpleural nodules within the posterior right lower lobe. Cannot rule out satellite lesions. 6. Sub solid, cystic nodule is noted within the lateral right lung base. No significant tracer uptake associated with this nodule. 7. No signs of metastatic disease to the abdomen or pelvis or skeletal structures. 8. Subacute, posttraumatic rib deformities are noted involving the anterior aspect of the left third and fourth ribs 9. Aortic Atherosclerosis (ICD10-I70.0) and Emphysema (ICD10-J43.9). 10. Multi vessel coronary artery atherosclerotic calcifications. Electronically Signed   By: Kerby Moors M.D.   On: 11/01/2019 10:56     ASSESSMENT/PLAN:  This is a very pleasant 73 year old Caucasian male diagnosed with  limited stage small cell lung cancer. He presented with a large right lower lobe/right hilar mass with obstruction of the right lower lobe bronchus.  The patient also has associated bulky right lower paratracheal and subcarinal adenopathy.  He was diagnosed in July 2021.  The patient is currently undergoing concurrent chemoradiation with carboplatin for an AUC of 2 and paclitaxel 45 mg/m.  He is status post his first cycle.  He  tolerated it well without any concerning complaints.  The patient undergoes radiotherapy under the care of Dr. Lisbeth Renshaw and Ebony Hail.  His last dose is expected on 8/6.  The patient recently had his staging PET scan and brain MRI.  The patient was seen with Dr. [name] today.  Dr. [name] personally and independently reviewed the scan discussed the results with patient today.  The scan showed the primary right lower lobe obstructive lung mass with associated paratracheal and subcarinal lymphadenopathy.  There is no evidence of metastatic disease outside the  chest.  This is consistent with limited stage disease.  The patient's pathology report recently had an addendum.  Dr. [name] had personally spoken to the pathologist last week who informed him that his pathology is most consistent with small cell lung cancer.  This was also discussed with several pathologist who are all in agreement.  Dr. [name] discussed this with the patient and his current condition and new recommended treatment options.  Dr. [name] recommends changing his treatment to carboplatin for an AUC of 5 on day 1 and etoposide 100 mg per metered squared on days 1, 2, and 3 IV every 3 weeks with Neulasta support.  Given the patient's CKD, the patient is not a candidate for cisplatin.  The patient is in agreement and is expected to receive his first dose of treatment next week on 11/12/2019.  The adverse side effects of treatment were discussed including but not limited to alopecia, bone marrow suppression, nausea, vomiting, and kidney liver dysfunction.  We will see him back for follow-up visit in 2 weeks for evaluation and a 1 week follow-up visit to see how he tolerated his first cycle of treatment.  The patient was advised to call immediately if he has any concerning symptoms in the interval. The patient voices understanding of current disease status and treatment options and is in agreement with the current care plan. All questions were  answered. The patient knows to call the clinic with any problems, questions or concerns. We can certainly see the patient much sooner if necessary       Orders Placed This Encounter  Procedures  . CBC with Differential (Cancer Center Only)    Standing Status:   Standing    Number of Occurrences:   12    Standing Expiration Date:   11/04/2020  . CMP ([facility] only)    Standing Status:   Standing    Number of Occurrences:   12    Standing Expiration Date:   11/04/2020     Tobe Sos Gavino Fouch, [name] 11/05/19   ADDENDUM: Hematology/Oncology Attending: I had a face-to-face encounter with the patient today.  I recommended his care plan.  This is a very pleasant 74 years old white male recently diagnosed with limited stage small cell lung cancer.  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  The patient received 1 dose of weekly carboplatin and paclitaxel with radiation. I had a lengthy discussion with the patient today about his current condition and treatment options.  I explained to the patient that we will have to change his treatment to the small cell regimen with carboplatin for AUC of 5 on day 1 and etoposide 100 mg/M2 on days 1, 2 and 3 with Neulasta support.  The patient is not eligible for cisplatin because of the renal insufficiency. I discussed with the patient the adverse effect of this treatment including but not limited to alopecia, myelosuppression, nausea and vomiting, peripheral neuropathy, liver or renal dysfunction. The patient is expected to start the first dose of this treatment next week. He will come back for follow-up visit in 2 weeks for evaluation and management of any adverse effect of his treatment. He will continue with a concurrent course of radiotherapy with the systemic  chemotherapy. The patient was advised to call immediately if he has any concerning symptoms in the interval.  Disclaimer: This note  was dictated with voice recognition software. Similar sounding words can inadvertently be transcribed and may be missed upon review. Eilleen Kempf, MD 11/05/19

## 2019-11-02 NOTE — Telephone Encounter (Signed)
Called for chemo follow up. States he is doing well. Eating and drink well. Having some itching from radiation- will tell them in RT this afternoon.

## 2019-11-03 ENCOUNTER — Ambulatory Visit (HOSPITAL_COMMUNITY)
Admission: RE | Admit: 2019-11-03 | Discharge: 2019-11-03 | Disposition: A | Discharge status: Home / Self Care | Payer: Medicare Other | Source: Ambulatory Visit | Attending: Physician Assistant | Admitting: Physician Assistant

## 2019-11-03 ENCOUNTER — Other Ambulatory Visit: Payer: Self-pay

## 2019-11-03 DIAGNOSIS — I6782 Cerebral ischemia: Secondary | ICD-10-CM | POA: Diagnosis not present

## 2019-11-03 DIAGNOSIS — C3490 Malignant neoplasm of unspecified part of unspecified bronchus or lung: Secondary | ICD-10-CM | POA: Diagnosis not present

## 2019-11-03 DIAGNOSIS — C3491 Malignant neoplasm of unspecified part of right bronchus or lung: Secondary | ICD-10-CM | POA: Diagnosis not present

## 2019-11-03 DIAGNOSIS — G319 Degenerative disease of nervous system, unspecified: Secondary | ICD-10-CM | POA: Diagnosis not present

## 2019-11-03 DIAGNOSIS — I739 Peripheral vascular disease, unspecified: Secondary | ICD-10-CM | POA: Diagnosis not present

## 2019-11-03 MED ORDER — GADOBUTROL 1 MMOL/ML IV SOLN
9.0000 mL | Freq: Once | INTRAVENOUS | Status: AC | PRN
  Administered 2019-11-03: 9 mL via INTRAVENOUS

## 2019-11-05 ENCOUNTER — Ambulatory Visit
Admission: RE | Admit: 2019-11-05 | Discharge: 2019-11-05 | Disposition: A | Discharge status: Home / Self Care | Payer: Medicare Other | Source: Ambulatory Visit | Attending: Radiation Oncology | Admitting: Radiation Oncology

## 2019-11-05 ENCOUNTER — Inpatient Hospital Stay: Payer: Medicare Other

## 2019-11-05 ENCOUNTER — Inpatient Hospital Stay (HOSPITAL_BASED_OUTPATIENT_CLINIC_OR_DEPARTMENT_OTHER): Payer: Medicare Other | Admitting: Physician Assistant

## 2019-11-05 ENCOUNTER — Encounter: Payer: Self-pay | Admitting: Physician Assistant

## 2019-11-05 ENCOUNTER — Other Ambulatory Visit: Payer: Self-pay

## 2019-11-05 ENCOUNTER — Other Ambulatory Visit: Payer: Self-pay | Admitting: Internal Medicine

## 2019-11-05 VITALS — BP 101/59 | HR 98 | Temp 97.5°F | Resp 16 | Wt 192.2 lb

## 2019-11-05 DIAGNOSIS — C3490 Malignant neoplasm of unspecified part of unspecified bronchus or lung: Secondary | ICD-10-CM

## 2019-11-05 DIAGNOSIS — E1122 Type 2 diabetes mellitus with diabetic chronic kidney disease: Secondary | ICD-10-CM | POA: Diagnosis not present

## 2019-11-05 DIAGNOSIS — Z79899 Other long term (current) drug therapy: Secondary | ICD-10-CM | POA: Diagnosis not present

## 2019-11-05 DIAGNOSIS — N183 Chronic kidney disease, stage 3 unspecified: Secondary | ICD-10-CM | POA: Diagnosis not present

## 2019-11-05 DIAGNOSIS — Z5111 Encounter for antineoplastic chemotherapy: Secondary | ICD-10-CM | POA: Diagnosis not present

## 2019-11-05 DIAGNOSIS — N1831 Chronic kidney disease, stage 3a: Secondary | ICD-10-CM

## 2019-11-05 DIAGNOSIS — Z7189 Other specified counseling: Secondary | ICD-10-CM | POA: Diagnosis not present

## 2019-11-05 DIAGNOSIS — Z510 Encounter for antineoplastic radiation therapy: Secondary | ICD-10-CM | POA: Diagnosis not present

## 2019-11-05 DIAGNOSIS — C3411 Malignant neoplasm of upper lobe, right bronchus or lung: Secondary | ICD-10-CM | POA: Diagnosis not present

## 2019-11-05 DIAGNOSIS — C3491 Malignant neoplasm of unspecified part of right bronchus or lung: Secondary | ICD-10-CM

## 2019-11-05 DIAGNOSIS — C3431 Malignant neoplasm of lower lobe, right bronchus or lung: Secondary | ICD-10-CM | POA: Diagnosis not present

## 2019-11-05 LAB — CMP (CANCER CENTER ONLY)
ALT: 10 U/L (ref 0–44)
AST: 9 U/L — ABNORMAL LOW (ref 15–41)
Albumin: 3 g/dL — ABNORMAL LOW (ref 3.5–5.0)
Alkaline Phosphatase: 92 U/L (ref 38–126)
Anion gap: 12 (ref 5–15)
BUN: 29 mg/dL — ABNORMAL HIGH (ref 8–23)
CO2: 23 mmol/L (ref 22–32)
Calcium: 9.5 mg/dL (ref 8.9–10.3)
Chloride: 99 mmol/L (ref 98–111)
Creatinine: 1.63 mg/dL — ABNORMAL HIGH (ref 0.61–1.24)
GFR, Est AFR Am: 48 mL/min — ABNORMAL LOW (ref >60)
GFR, Estimated: 41 mL/min — ABNORMAL LOW (ref >60)
Glucose, Bld: 262 mg/dL — ABNORMAL HIGH (ref 70–99)
Potassium: 4.9 mmol/L (ref 3.5–5.1)
Sodium: 134 mmol/L — ABNORMAL LOW (ref 135–145)
Total Bilirubin: 0.4 mg/dL (ref 0.3–1.2)
Total Protein: 6.8 g/dL (ref 6.5–8.1)

## 2019-11-05 LAB — CBC WITH DIFFERENTIAL (CANCER CENTER ONLY)
Abs Immature Granulocytes: 0.06 10*3/uL (ref 0.00–0.07)
Basophils Absolute: 0 10*3/uL (ref 0.0–0.1)
Basophils Relative: 0 %
Eosinophils Absolute: 0.1 10*3/uL (ref 0.0–0.5)
Eosinophils Relative: 2 %
HCT: 38.4 % — ABNORMAL LOW (ref 39.0–52.0)
Hemoglobin: 12.4 g/dL — ABNORMAL LOW (ref 13.0–17.0)
Immature Granulocytes: 1 %
Lymphocytes Relative: 8 %
Lymphs Abs: 0.6 10*3/uL — ABNORMAL LOW (ref 0.7–4.0)
MCH: 26.8 pg (ref 26.0–34.0)
MCHC: 32.3 g/dL (ref 30.0–36.0)
MCV: 82.9 fL (ref 80.0–100.0)
Monocytes Absolute: 0.6 10*3/uL (ref 0.1–1.0)
Monocytes Relative: 7 %
Neutro Abs: 6.1 10*3/uL (ref 1.7–7.7)
Neutrophils Relative %: 82 %
Platelet Count: 195 10*3/uL (ref 150–400)
RBC: 4.63 MIL/uL (ref 4.22–5.81)
RDW: 13.1 % (ref 11.5–15.5)
WBC Count: 7.5 10*3/uL (ref 4.0–10.5)
nRBC: 0 % (ref 0.0–0.2)

## 2019-11-05 NOTE — Progress Notes (Signed)
START ON PATHWAY REGIMEN - Small Cell Lung     A cycle is every 21 days:     Carboplatin      Etoposide   **Always confirm dose/schedule in your pharmacy ordering system**  Patient Characteristics: Newly Diagnosed, Preoperative or Nonsurgical Candidate (Clinical Staging), First Line, Limited Stage, Nonsurgical Candidate Therapeutic Status: Newly Diagnosed, Preoperative or Nonsurgical Candidate (Clinical Staging) AJCC T Category: cT3 AJCC N Category: cN2 AJCC M Category: cM0 AJCC 8 Stage Grouping: IIIB Stage Classification: Limited Surgical Candidacy: Nonsurgical Candidate Intent of Therapy: Curative Intent, Discussed with Patient 

## 2019-11-05 NOTE — Patient Instructions (Addendum)
-  There are two main categories of lung cancer, they are named based on the size of the cancer cell. One is called Non-Small cell lung cancer. The other type is Small Cell Lung Cancer -The sample (biopsy) that they took of your tumor was consistent with Small Cell Lung Cancer -We covered a lot of important information at your appointment today regarding what the treatment plan is moving forward. Here are the the main points that were discussed at your office visit with [place] today:  -The treatment that you will receive consists of Two chemotherapy drugs, called Carboplatin and Etoposide.  -We are planning on starting your treatment next week on 11/12/19 -Your treatment will be given for three consecutive days every 3 weeks. You would need to come back for an injection 2 days after your 3rd day of treatment. The purpose of this injection is to boost up your infection fighting cells so that they do not drop too low as a side effect of chemotherapy. We will check your labs once a week just to make sure that important components of your blood are in an acceptable range. We would do these three days in a row every 3 weeks for a total of somewhere between 4-6 times.  -We will get a CT scan after 2 treatments to check on the progress of treatment  Medications:  -I have sent a few important medication prescriptions to your pharmacy.  -Compazine was sent to your pharmacy. This medication is for nausea. You may take this every 6 hours as needed if you feel nausous. .   Side Effects:  -The adverse effect of this treatment including but not limited to alopecia (losing your hair), myelosuppression (drops in the blood counts), nausea and vomiting, peripheral neuropathy (numbness and tingling in the hands and feet), hearing deficit, liver or renal dysfunction.   Referrals:  -I have sent a referral to radiation oncology so they can discuss with you the role of radiation treatment in addition to chemotherapy. I will  message Dr. Lisbeth Renshaw and Bryson Ha about the change in plan.    Follow up:  -We will see you back for a follow up visit in 2 weeks.

## 2019-11-06 ENCOUNTER — Ambulatory Visit
Admission: RE | Admit: 2019-11-06 | Discharge: 2019-11-06 | Disposition: A | Discharge status: Home / Self Care | Payer: Medicare Other | Source: Ambulatory Visit | Attending: Radiation Oncology | Admitting: Radiation Oncology

## 2019-11-06 ENCOUNTER — Other Ambulatory Visit: Payer: Self-pay

## 2019-11-06 DIAGNOSIS — C3411 Malignant neoplasm of upper lobe, right bronchus or lung: Secondary | ICD-10-CM | POA: Diagnosis not present

## 2019-11-06 DIAGNOSIS — C3431 Malignant neoplasm of lower lobe, right bronchus or lung: Secondary | ICD-10-CM | POA: Diagnosis not present

## 2019-11-06 DIAGNOSIS — Z510 Encounter for antineoplastic radiation therapy: Secondary | ICD-10-CM | POA: Diagnosis not present

## 2019-11-06 NOTE — Progress Notes (Signed)
Pharmacist Chemotherapy Monitoring - Initial Assessment    Anticipated start date: 11/12/2019    Regimen:  . Are orders appropriate based on the patient's diagnosis, regimen, and cycle? Yes . Does the plan date match the patient's scheduled date? Yes . Is the sequencing of drugs appropriate? Yes . Are the premedications appropriate for the patient's regimen? Yes . Prior Authorization for treatment is: Pending o If applicable, is the correct biosimilar selected based on the patient's insurance? not applicable  Organ Function and Labs: [name] Kitchen Are dose adjustments needed based on the patient's renal function, hepatic function, or hematologic function? Yes . Are appropriate labs ordered prior to the start of patient's treatment? Yes . Other organ system assessment, if indicated: N/A . The following baseline labs, if indicated, have been ordered: N/A  Dose Assessment: . Are the drug doses appropriate? Yes . Are the following correct: o Drug concentrations Yes o IV fluid compatible with drug Yes o Administration routes Yes o Timing of therapy Yes . If applicable, does the patient have documented access for treatment and/or plans for port-a-cath placement? Unknown @ time of review . If applicable, have lifetime cumulative doses been properly documented and assessed? yes Lifetime Dose Tracking  . Carboplatin: 170 mg = 0.01 % of the maximum lifetime dose of 999,999,999 mg  o   Toxicity Monitoring/Prevention: . The patient has the following take home antiemetics prescribed: Prochlorperazine . The patient has the following take home medications prescribed: N/A . Medication allergies and previous infusion related reactions, if applicable, have been reviewed and addressed. Yes . The patient's current medication list has been assessed for drug-drug interactions with their chemotherapy regimen. no significant drug-drug interactions were identified on review.  Order Review: . Are the treatment plan  orders signed? Yes . Is the patient scheduled to see a provider prior to their treatment? No  I verify that I have reviewed each item in the above checklist and answered each question accordingly.  [name] D 11/06/2019 4:07 PM

## 2019-11-07 ENCOUNTER — Ambulatory Visit
Admission: RE | Admit: 2019-11-07 | Discharge: 2019-11-07 | Disposition: A | Discharge status: Home / Self Care | Payer: Medicare Other | Source: Ambulatory Visit | Attending: Radiation Oncology | Admitting: Radiation Oncology

## 2019-11-07 ENCOUNTER — Ambulatory Visit: Payer: Medicare Other

## 2019-11-07 ENCOUNTER — Other Ambulatory Visit: Payer: Self-pay | Admitting: *Deleted

## 2019-11-07 ENCOUNTER — Other Ambulatory Visit: Payer: Self-pay

## 2019-11-07 DIAGNOSIS — Z510 Encounter for antineoplastic radiation therapy: Secondary | ICD-10-CM | POA: Diagnosis not present

## 2019-11-07 DIAGNOSIS — C3431 Malignant neoplasm of lower lobe, right bronchus or lung: Secondary | ICD-10-CM | POA: Diagnosis not present

## 2019-11-07 DIAGNOSIS — C3411 Malignant neoplasm of upper lobe, right bronchus or lung: Secondary | ICD-10-CM | POA: Diagnosis not present

## 2019-11-07 NOTE — Patient Outreach (Signed)
[facility]) Care Management  11/07/2019  [name] 12-18-1945 [id number]   Call placed to member to follow up on management of cancer treatments.  State he is still having the daily radiation sessions, will soon start chemo infusions on 8/2.  He did have PET scan complete, cancer reportedly localized to the lungs, no metastasis.  State she continues to have some shortness of breath and cough intermittently, relieved with rest.  He is still having a hard time managing his diabetes, blood sugar today was 311.  Verbalizes understanding regarding healthy diet and as much exercise as he can tolerate.  Denies any urgent concerns at this time, encouraged to contact this care manager with questions.  Will follow up within the next month.  Goals Addressed              This Visit's Progress   .  Finish radiation treatment and get better (pt-stated)   On track     [name] (see longitudinal plan of care for additional care plan information)  Current Barriers:  [name] Kitchen Knowledge Deficits related to new cancer diagnosis  Nurse Case Manager Clinical Goal(s):  [name] Kitchen Over the next 28 days, patient will verbalize understanding of plan for cancer treatments . Over the next 31 days, patient will not experience hospital admission. Hospital Admissions in last 6 months = 2 . Over the next 28 days, patient will attend all scheduled medical appointments: with cancer center  Interventions:  . Inter-disciplinary care team collaboration (see longitudinal plan of care) . Evaluation of current treatment plan related to radiation therapy and patient's adherence to plan as established by provider. . Discussed plans with patient for ongoing care management follow up and provided patient with direct contact information for care management team . Reviewed scheduled/upcoming provider appointments including: cancer radiation therapy  Patient Self Care Activities:  . Attends all scheduled provider  appointments . Performs ADL's independently . Calls provider office for new concerns or questions  Initial goal documentation       [name], RN, MSN [place] [phone]

## 2019-11-08 ENCOUNTER — Other Ambulatory Visit: Payer: Self-pay

## 2019-11-08 ENCOUNTER — Ambulatory Visit
Admission: RE | Admit: 2019-11-08 | Discharge: 2019-11-08 | Disposition: A | Discharge status: Home / Self Care | Payer: Medicare Other | Source: Ambulatory Visit | Attending: Radiation Oncology | Admitting: Radiation Oncology

## 2019-11-12 ENCOUNTER — Inpatient Hospital Stay: Payer: Medicare Other

## 2019-11-12 ENCOUNTER — Ambulatory Visit
Admission: RE | Admit: 2019-11-12 | Discharge: 2019-11-12 | Disposition: A | Discharge status: Home / Self Care | Payer: Medicare Other | Source: Ambulatory Visit | Attending: Radiation Oncology | Admitting: Radiation Oncology

## 2019-11-12 ENCOUNTER — Other Ambulatory Visit: Payer: Self-pay | Admitting: Physician Assistant

## 2019-11-12 ENCOUNTER — Inpatient Hospital Stay: Payer: Medicare Other | Attending: Physician Assistant

## 2019-11-12 ENCOUNTER — Other Ambulatory Visit: Payer: Self-pay

## 2019-11-12 VITALS — BP 156/88 | HR 107 | Temp 98.4°F | Resp 17

## 2019-11-12 DIAGNOSIS — C3411 Malignant neoplasm of upper lobe, right bronchus or lung: Secondary | ICD-10-CM | POA: Diagnosis not present

## 2019-11-12 DIAGNOSIS — IMO0002 Reserved for concepts with insufficient information to code with codable children: Secondary | ICD-10-CM

## 2019-11-12 DIAGNOSIS — Z510 Encounter for antineoplastic radiation therapy: Secondary | ICD-10-CM | POA: Insufficient documentation

## 2019-11-12 DIAGNOSIS — E1122 Type 2 diabetes mellitus with diabetic chronic kidney disease: Secondary | ICD-10-CM | POA: Diagnosis not present

## 2019-11-12 DIAGNOSIS — Z5111 Encounter for antineoplastic chemotherapy: Secondary | ICD-10-CM | POA: Diagnosis not present

## 2019-11-12 DIAGNOSIS — E1142 Type 2 diabetes mellitus with diabetic polyneuropathy: Secondary | ICD-10-CM

## 2019-11-12 DIAGNOSIS — C3491 Malignant neoplasm of unspecified part of right bronchus or lung: Secondary | ICD-10-CM

## 2019-11-12 DIAGNOSIS — C3431 Malignant neoplasm of lower lobe, right bronchus or lung: Secondary | ICD-10-CM | POA: Diagnosis not present

## 2019-11-12 DIAGNOSIS — N183 Chronic kidney disease, stage 3 unspecified: Secondary | ICD-10-CM | POA: Insufficient documentation

## 2019-11-12 DIAGNOSIS — C3490 Malignant neoplasm of unspecified part of unspecified bronchus or lung: Secondary | ICD-10-CM

## 2019-11-12 LAB — CMP (CANCER CENTER ONLY)
ALT: 8 U/L (ref 0–44)
AST: 8 U/L — ABNORMAL LOW (ref 15–41)
Albumin: 3.1 g/dL — ABNORMAL LOW (ref 3.5–5.0)
Alkaline Phosphatase: 100 U/L (ref 38–126)
Anion gap: 9 (ref 5–15)
BUN: 19 mg/dL (ref 8–23)
CO2: 24 mmol/L (ref 22–32)
Calcium: 9 mg/dL (ref 8.9–10.3)
Chloride: 99 mmol/L (ref 98–111)
Creatinine: 1.46 mg/dL — ABNORMAL HIGH (ref 0.61–1.24)
GFR, Est AFR Am: 54 mL/min — ABNORMAL LOW (ref >60)
GFR, Estimated: 47 mL/min — ABNORMAL LOW (ref >60)
Glucose, Bld: 384 mg/dL — ABNORMAL HIGH (ref 70–99)
Potassium: 4.4 mmol/L (ref 3.5–5.1)
Sodium: 132 mmol/L — ABNORMAL LOW (ref 135–145)
Total Bilirubin: 0.3 mg/dL (ref 0.3–1.2)
Total Protein: 6.5 g/dL (ref 6.5–8.1)

## 2019-11-12 LAB — CBC WITH DIFFERENTIAL (CANCER CENTER ONLY)
Abs Immature Granulocytes: 0.03 10*3/uL (ref 0.00–0.07)
Basophils Absolute: 0 10*3/uL (ref 0.0–0.1)
Basophils Relative: 0 %
Eosinophils Absolute: 0 10*3/uL (ref 0.0–0.5)
Eosinophils Relative: 1 %
HCT: 35.5 % — ABNORMAL LOW (ref 39.0–52.0)
Hemoglobin: 11.8 g/dL — ABNORMAL LOW (ref 13.0–17.0)
Immature Granulocytes: 1 %
Lymphocytes Relative: 8 %
Lymphs Abs: 0.4 10*3/uL — ABNORMAL LOW (ref 0.7–4.0)
MCH: 27.6 pg (ref 26.0–34.0)
MCHC: 33.2 g/dL (ref 30.0–36.0)
MCV: 82.9 fL (ref 80.0–100.0)
Monocytes Absolute: 0.6 10*3/uL (ref 0.1–1.0)
Monocytes Relative: 13 %
Neutro Abs: 3.6 10*3/uL (ref 1.7–7.7)
Neutrophils Relative %: 77 %
Platelet Count: 125 10*3/uL — ABNORMAL LOW (ref 150–400)
RBC: 4.28 MIL/uL (ref 4.22–5.81)
RDW: 13.5 % (ref 11.5–15.5)
WBC Count: 4.7 10*3/uL (ref 4.0–10.5)
nRBC: 0 % (ref 0.0–0.2)

## 2019-11-12 MED ORDER — INSULIN REGULAR HUMAN 100 UNIT/ML IJ SOLN
10.0000 [IU] | Freq: Once | INTRAMUSCULAR | Status: AC
  Administered 2019-11-12: 10 [IU] via SUBCUTANEOUS

## 2019-11-12 MED ORDER — SODIUM CHLORIDE 0.9 % IV SOLN
150.0000 mg | Freq: Once | INTRAVENOUS | Status: AC
  Administered 2019-11-12: 150 mg via INTRAVENOUS
  Filled 2019-11-12: qty 150

## 2019-11-12 MED ORDER — PALONOSETRON HCL INJECTION 0.25 MG/5ML
0.2500 mg | Freq: Once | INTRAVENOUS | Status: AC
  Administered 2019-11-12: 0.25 mg via INTRAVENOUS

## 2019-11-12 MED ORDER — SODIUM CHLORIDE 0.9 % IV SOLN
97.0000 mg/m2 | Freq: Once | INTRAVENOUS | Status: AC
  Administered 2019-11-12: 200 mg via INTRAVENOUS
  Filled 2019-11-12: qty 10

## 2019-11-12 MED ORDER — SODIUM CHLORIDE 0.9 % IV SOLN
Freq: Once | INTRAVENOUS | Status: AC
  Filled 2019-11-12: qty 250

## 2019-11-12 MED ORDER — INSULIN REGULAR HUMAN 100 UNIT/ML IJ SOLN
INTRAMUSCULAR | Status: AC
  Filled 2019-11-12: qty 1

## 2019-11-12 MED ORDER — PALONOSETRON HCL INJECTION 0.25 MG/5ML
INTRAVENOUS | Status: AC
  Filled 2019-11-12: qty 5

## 2019-11-12 MED ORDER — SODIUM CHLORIDE 0.9 % IV SOLN
374.0000 mg | Freq: Once | INTRAVENOUS | Status: AC
  Administered 2019-11-12: 370 mg via INTRAVENOUS
  Filled 2019-11-12: qty 37

## 2019-11-12 MED ORDER — SODIUM CHLORIDE 0.9 % IV SOLN
10.0000 mg | Freq: Once | INTRAVENOUS | Status: AC
  Administered 2019-11-12: 10 mg via INTRAVENOUS
  Filled 2019-11-12: qty 10

## 2019-11-12 NOTE — Progress Notes (Signed)
Ok to treat with HR today per [facility]

## 2019-11-12 NOTE — Patient Instructions (Signed)
[place] Discharge Instructions for Patients Receiving Chemotherapy  Today you received the following chemotherapy agents: Carboplatin, Etoposide   To help prevent nausea and vomiting after your treatment, we encourage you to take your nausea medication as directed.    If you develop nausea and vomiting that is not controlled by your nausea medication, call the clinic.   BELOW ARE SYMPTOMS THAT SHOULD BE REPORTED IMMEDIATELY:  *FEVER GREATER THAN 100.5 F  *CHILLS WITH OR WITHOUT FEVER  NAUSEA AND VOMITING THAT IS NOT CONTROLLED WITH YOUR NAUSEA MEDICATION  *UNUSUAL SHORTNESS OF BREATH  *UNUSUAL BRUISING OR BLEEDING  TENDERNESS IN MOUTH AND THROAT WITH OR WITHOUT PRESENCE OF ULCERS  *URINARY PROBLEMS  *BOWEL PROBLEMS  UNUSUAL RASH Items with * indicate a potential emergency and should be followed up as soon as possible.  Feel free to call the clinic should you have any questions or concerns. The clinic phone number is [phone].  Please show the [place] at check-in to the Emergency Department and triage nurse.  Etoposide, VP-16 injection What is this medicine? ETOPOSIDE, VP-16 (e toe POE side) is a chemotherapy drug. It is used to treat testicular cancer, lung cancer, and other cancers. This medicine may be used for other purposes; ask your health care provider or pharmacist if you have questions. COMMON BRAND NAME(S): Etopophos, Toposar, VePesid What should I tell my health care provider before I take this medicine? They need to know if you have any of these conditions:  infection  kidney disease  liver disease  low blood counts, like low white cell, platelet, or red cell counts  an unusual or allergic reaction to etoposide, other medicines, foods, dyes, or preservatives  pregnant or trying to get pregnant  breast-feeding How should I use this medicine? This medicine is for infusion into a vein. It is administered in a hospital or  clinic by a specially trained health care professional. Talk to your pediatrician regarding the use of this medicine in children. Special care may be needed. Overdosage: If you think you have taken too much of this medicine contact a poison control center or emergency room at once. NOTE: This medicine is only for you. Do not share this medicine with others. What if I miss a dose? It is important not to miss your dose. Call your doctor or health care professional if you are unable to keep an appointment. What may interact with this medicine? This medicine may interact with the following medications:  warfarin This list may not describe all possible interactions. Give your health care provider a list of all the medicines, herbs, non-prescription drugs, or dietary supplements you use. Also tell them if you smoke, drink alcohol, or use illegal drugs. Some items may interact with your medicine. What should I watch for while using this medicine? Visit your doctor for checks on your progress. This drug may make you feel generally unwell. This is not uncommon, as chemotherapy can affect healthy cells as well as cancer cells. Report any side effects. Continue your course of treatment even though you feel ill unless your doctor tells you to stop. In some cases, you may be given additional medicines to help with side effects. Follow all directions for their use. Call your doctor or health care professional for advice if you get a fever, chills or sore throat, or other symptoms of a cold or flu. Do not treat yourself. This drug decreases your body's ability to fight infections. Try to avoid being  around people who are sick. This medicine may increase your risk to bruise or bleed. Call your doctor or health care professional if you notice any unusual bleeding. Talk to your doctor about your risk of cancer. You may be more at risk for certain types of cancers if you take this medicine. Do not become pregnant  while taking this medicine or for at least 6 months after stopping it. Women should inform their doctor if they wish to become pregnant or think they might be pregnant. Women of child-bearing potential will need to have a negative pregnancy test before starting this medicine. There is a potential for serious side effects to an unborn child. Talk to your health care professional or pharmacist for more information. Do not breast-feed an infant while taking this medicine. Men must use a latex condom during sexual contact with a woman while taking this medicine and for at least 4 months after stopping it. A latex condom is needed even if you have had a vasectomy. Contact your doctor right away if your partner becomes pregnant. Do not donate sperm while taking this medicine and for at least 4 months after you stop taking this medicine. Men should inform their doctors if they wish to father a child. This medicine may lower sperm counts. What side effects may I notice from receiving this medicine? Side effects that you should report to your doctor or health care professional as soon as possible:  allergic reactions like skin rash, itching or hives, swelling of the face, lips, or tongue  low blood counts - this medicine may decrease the number of white blood cells, red blood cells, and platelets. You may be at increased risk for infections and bleeding  nausea, vomiting  redness, blistering, peeling or loosening of the skin, including inside the mouth  signs and symptoms of infection like fever; chills; cough; sore throat; pain or trouble passing urine  signs and symptoms of low red blood cells or anemia such as unusually weak or tired; feeling faint or lightheaded; falls; breathing problems  unusual bruising or bleeding Side effects that usually do not require medical attention (report to your doctor or health care professional if they continue or are bothersome):  changes in taste  diarrhea  hair  loss  loss of appetite  mouth sores This list may not describe all possible side effects. Call your doctor for medical advice about side effects. You may report side effects to FDA at 1-800-FDA-1088. Where should I keep my medicine? This drug is given in a hospital or clinic and will not be stored at home. NOTE: This sheet is a summary. It may not cover all possible information. If you have questions about this medicine, talk to your doctor, pharmacist, or health care provider.  2020 Elsevier/Gold Standard (2018-05-24 16:57:15)

## 2019-11-12 NOTE — Progress Notes (Signed)
Recheck blood glucose 280 1hr after insulin administration.

## 2019-11-13 ENCOUNTER — Other Ambulatory Visit: Payer: Self-pay

## 2019-11-13 ENCOUNTER — Ambulatory Visit
Admission: RE | Admit: 2019-11-13 | Discharge: 2019-11-13 | Disposition: A | Discharge status: Home / Self Care | Payer: Medicare Other | Source: Ambulatory Visit | Attending: Radiation Oncology | Admitting: Radiation Oncology

## 2019-11-13 ENCOUNTER — Ambulatory Visit: Payer: Medicare Other

## 2019-11-13 ENCOUNTER — Inpatient Hospital Stay: Payer: Medicare Other

## 2019-11-13 ENCOUNTER — Encounter: Payer: Self-pay | Admitting: Internal Medicine

## 2019-11-13 VITALS — BP 163/84 | HR 91 | Temp 97.7°F | Resp 16

## 2019-11-13 DIAGNOSIS — C3431 Malignant neoplasm of lower lobe, right bronchus or lung: Secondary | ICD-10-CM | POA: Diagnosis not present

## 2019-11-13 DIAGNOSIS — E1122 Type 2 diabetes mellitus with diabetic chronic kidney disease: Secondary | ICD-10-CM | POA: Diagnosis not present

## 2019-11-13 DIAGNOSIS — N183 Chronic kidney disease, stage 3 unspecified: Secondary | ICD-10-CM | POA: Diagnosis not present

## 2019-11-13 DIAGNOSIS — Z5111 Encounter for antineoplastic chemotherapy: Secondary | ICD-10-CM | POA: Diagnosis not present

## 2019-11-13 DIAGNOSIS — Z510 Encounter for antineoplastic radiation therapy: Secondary | ICD-10-CM | POA: Diagnosis not present

## 2019-11-13 DIAGNOSIS — C3411 Malignant neoplasm of upper lobe, right bronchus or lung: Secondary | ICD-10-CM | POA: Diagnosis not present

## 2019-11-13 DIAGNOSIS — C3491 Malignant neoplasm of unspecified part of right bronchus or lung: Secondary | ICD-10-CM

## 2019-11-13 MED ORDER — SODIUM CHLORIDE 0.9 % IV SOLN
Freq: Once | INTRAVENOUS | Status: AC
  Filled 2019-11-13: qty 250

## 2019-11-13 MED ORDER — SODIUM CHLORIDE 0.9 % IV SOLN
10.0000 mg | Freq: Once | INTRAVENOUS | Status: AC
  Administered 2019-11-13: 10 mg via INTRAVENOUS
  Filled 2019-11-13: qty 10

## 2019-11-13 MED ORDER — SODIUM CHLORIDE 0.9 % IV SOLN
97.0000 mg/m2 | Freq: Once | INTRAVENOUS | Status: AC
  Administered 2019-11-13: 200 mg via INTRAVENOUS
  Filled 2019-11-13: qty 10

## 2019-11-13 NOTE — Patient Instructions (Signed)
[place] Discharge Instructions for Patients Receiving Chemotherapy  Today you received the following chemotherapy agents Etoposide   To help prevent nausea and vomiting after your treatment, we encourage you to take your nausea medication as directed by MD    If you develop nausea and vomiting that is not controlled by your nausea medication, call the clinic.   BELOW ARE SYMPTOMS THAT SHOULD BE REPORTED IMMEDIATELY:  *FEVER GREATER THAN 100.5 F  *CHILLS WITH OR WITHOUT FEVER  NAUSEA AND VOMITING THAT IS NOT CONTROLLED WITH YOUR NAUSEA MEDICATION  *UNUSUAL SHORTNESS OF BREATH  *UNUSUAL BRUISING OR BLEEDING  TENDERNESS IN MOUTH AND THROAT WITH OR WITHOUT PRESENCE OF ULCERS  *URINARY PROBLEMS  *BOWEL PROBLEMS  UNUSUAL RASH Items with * indicate a potential emergency and should be followed up as soon as possible.  Feel free to call the clinic should you have any questions or concerns. The clinic phone number is [phone].  Please show the [place] at check-in to the Emergency Department and triage nurse.

## 2019-11-13 NOTE — Progress Notes (Signed)
Met with patient at registration to introduce myself as Financial Resource Specialist and to offer available resources.  Discussed one-time $1000 [employer] grant and qualifications to assist with personal expenses while going through treatment.  Gave him my card if interested in applying and for any additional financial questions or concerns.  

## 2019-11-14 ENCOUNTER — Ambulatory Visit: Payer: Medicare Other

## 2019-11-14 ENCOUNTER — Inpatient Hospital Stay: Payer: Medicare Other

## 2019-11-14 ENCOUNTER — Ambulatory Visit
Admission: RE | Admit: 2019-11-14 | Discharge: 2019-11-14 | Disposition: A | Discharge status: Home / Self Care | Payer: Medicare Other | Source: Ambulatory Visit | Attending: Radiation Oncology | Admitting: Radiation Oncology

## 2019-11-14 ENCOUNTER — Other Ambulatory Visit: Payer: Self-pay

## 2019-11-14 VITALS — BP 157/82 | HR 100 | Temp 97.9°F | Resp 18 | Wt 197.5 lb

## 2019-11-14 DIAGNOSIS — E1122 Type 2 diabetes mellitus with diabetic chronic kidney disease: Secondary | ICD-10-CM | POA: Diagnosis not present

## 2019-11-14 DIAGNOSIS — C3411 Malignant neoplasm of upper lobe, right bronchus or lung: Secondary | ICD-10-CM | POA: Diagnosis not present

## 2019-11-14 DIAGNOSIS — C3491 Malignant neoplasm of unspecified part of right bronchus or lung: Secondary | ICD-10-CM

## 2019-11-14 DIAGNOSIS — Z510 Encounter for antineoplastic radiation therapy: Secondary | ICD-10-CM | POA: Diagnosis not present

## 2019-11-14 DIAGNOSIS — N183 Chronic kidney disease, stage 3 unspecified: Secondary | ICD-10-CM | POA: Diagnosis not present

## 2019-11-14 DIAGNOSIS — C3431 Malignant neoplasm of lower lobe, right bronchus or lung: Secondary | ICD-10-CM | POA: Diagnosis not present

## 2019-11-14 DIAGNOSIS — Z5111 Encounter for antineoplastic chemotherapy: Secondary | ICD-10-CM | POA: Diagnosis not present

## 2019-11-14 MED ORDER — SODIUM CHLORIDE 0.9 % IV SOLN
97.0000 mg/m2 | Freq: Once | INTRAVENOUS | Status: AC
  Administered 2019-11-14: 200 mg via INTRAVENOUS
  Filled 2019-11-14: qty 10

## 2019-11-14 MED ORDER — SODIUM CHLORIDE 0.9 % IV SOLN
10.0000 mg | Freq: Once | INTRAVENOUS | Status: AC
  Administered 2019-11-14: 10 mg via INTRAVENOUS
  Filled 2019-11-14: qty 10

## 2019-11-14 MED ORDER — SODIUM CHLORIDE 0.9 % IV SOLN
Freq: Once | INTRAVENOUS | Status: AC
  Filled 2019-11-14: qty 250

## 2019-11-14 NOTE — Patient Instructions (Signed)
[facility] Discharge Instructions for Patients Receiving Chemotherapy  Today you received the following chemotherapy agents: Etoposide (VEPESID).  To help prevent nausea and vomiting after your treatment, we encourage you to take your nausea medication as prescribed.   If you develop nausea and vomiting that is not controlled by your nausea medication, call the clinic.   BELOW ARE SYMPTOMS THAT SHOULD BE REPORTED IMMEDIATELY:  *FEVER GREATER THAN 100.5 F  *CHILLS WITH OR WITHOUT FEVER  NAUSEA AND VOMITING THAT IS NOT CONTROLLED WITH YOUR NAUSEA MEDICATION  *UNUSUAL SHORTNESS OF BREATH  *UNUSUAL BRUISING OR BLEEDING  TENDERNESS IN MOUTH AND THROAT WITH OR WITHOUT PRESENCE OF ULCERS  *URINARY PROBLEMS  *BOWEL PROBLEMS  UNUSUAL RASH Items with * indicate a potential emergency and should be followed up as soon as possible.  Feel free to call the clinic should you have any questions or concerns. The clinic phone number is [phone].  Please show the CHEMO ALERT CARD at check-in to the Emergency Department and triage nurse.   

## 2019-11-15 ENCOUNTER — Emergency Department (HOSPITAL_COMMUNITY)
Admission: EM | Admit: 2019-11-15 | Discharge: 2019-11-15 | Disposition: A | Discharge status: Home / Self Care | Payer: Medicare Other | Attending: Emergency Medicine | Admitting: Emergency Medicine

## 2019-11-15 ENCOUNTER — Encounter (HOSPITAL_COMMUNITY): Payer: Self-pay | Admitting: Emergency Medicine

## 2019-11-15 ENCOUNTER — Ambulatory Visit: Admission: RE | Admit: 2019-11-15 | Payer: Medicare Other | Source: Ambulatory Visit

## 2019-11-15 ENCOUNTER — Other Ambulatory Visit: Payer: Self-pay

## 2019-11-15 ENCOUNTER — Emergency Department (HOSPITAL_COMMUNITY): Payer: Medicare Other

## 2019-11-15 DIAGNOSIS — R55 Syncope and collapse: Secondary | ICD-10-CM | POA: Diagnosis not present

## 2019-11-15 DIAGNOSIS — Z87891 Personal history of nicotine dependence: Secondary | ICD-10-CM | POA: Diagnosis not present

## 2019-11-15 DIAGNOSIS — I129 Hypertensive chronic kidney disease with stage 1 through stage 4 chronic kidney disease, or unspecified chronic kidney disease: Secondary | ICD-10-CM | POA: Insufficient documentation

## 2019-11-15 DIAGNOSIS — R590 Localized enlarged lymph nodes: Secondary | ICD-10-CM | POA: Diagnosis not present

## 2019-11-15 DIAGNOSIS — S2242XA Multiple fractures of ribs, left side, initial encounter for closed fracture: Secondary | ICD-10-CM | POA: Diagnosis not present

## 2019-11-15 DIAGNOSIS — Z794 Long term (current) use of insulin: Secondary | ICD-10-CM | POA: Diagnosis not present

## 2019-11-15 DIAGNOSIS — C3431 Malignant neoplasm of lower lobe, right bronchus or lung: Secondary | ICD-10-CM | POA: Diagnosis not present

## 2019-11-15 DIAGNOSIS — Z96641 Presence of right artificial hip joint: Secondary | ICD-10-CM | POA: Diagnosis not present

## 2019-11-15 DIAGNOSIS — E871 Hypo-osmolality and hyponatremia: Secondary | ICD-10-CM | POA: Insufficient documentation

## 2019-11-15 DIAGNOSIS — D649 Anemia, unspecified: Secondary | ICD-10-CM | POA: Insufficient documentation

## 2019-11-15 DIAGNOSIS — I10 Essential (primary) hypertension: Secondary | ICD-10-CM | POA: Diagnosis not present

## 2019-11-15 DIAGNOSIS — N183 Chronic kidney disease, stage 3 unspecified: Secondary | ICD-10-CM | POA: Insufficient documentation

## 2019-11-15 DIAGNOSIS — I2510 Atherosclerotic heart disease of native coronary artery without angina pectoris: Secondary | ICD-10-CM | POA: Diagnosis not present

## 2019-11-15 DIAGNOSIS — I700 Atherosclerosis of aorta: Secondary | ICD-10-CM | POA: Diagnosis not present

## 2019-11-15 DIAGNOSIS — Z5111 Encounter for antineoplastic chemotherapy: Secondary | ICD-10-CM | POA: Diagnosis not present

## 2019-11-15 DIAGNOSIS — Z79899 Other long term (current) drug therapy: Secondary | ICD-10-CM | POA: Insufficient documentation

## 2019-11-15 DIAGNOSIS — E1122 Type 2 diabetes mellitus with diabetic chronic kidney disease: Secondary | ICD-10-CM | POA: Diagnosis not present

## 2019-11-15 DIAGNOSIS — R4020 Unspecified coma: Secondary | ICD-10-CM

## 2019-11-15 LAB — URINALYSIS, ROUTINE W REFLEX MICROSCOPIC
Bilirubin Urine: NEGATIVE
Glucose, UA: >=500 mg/dL — AB
Ketones, ur: NEGATIVE mg/dL
Leukocytes,Ua: NEGATIVE
Nitrite: NEGATIVE
Protein, ur: >=300 mg/dL — AB
Specific Gravity, Urine: 1.017 (ref 1.005–1.030)
pH: 5 (ref 5.0–8.0)

## 2019-11-15 LAB — TROPONIN I (HIGH SENSITIVITY)
Troponin I (High Sensitivity): 12 ng/L (ref <18)
Troponin I (High Sensitivity): 14 ng/L (ref <18)

## 2019-11-15 LAB — CBC
HCT: 30.8 % — ABNORMAL LOW (ref 39.0–52.0)
Hemoglobin: 10.3 g/dL — ABNORMAL LOW (ref 13.0–17.0)
MCH: 27.8 pg (ref 26.0–34.0)
MCHC: 33.4 g/dL (ref 30.0–36.0)
MCV: 83.2 fL (ref 80.0–100.0)
Platelets: 146 10*3/uL — ABNORMAL LOW (ref 150–400)
RBC: 3.7 MIL/uL — ABNORMAL LOW (ref 4.22–5.81)
RDW: 13.8 % (ref 11.5–15.5)
WBC: 6.4 10*3/uL (ref 4.0–10.5)
nRBC: 0 % (ref 0.0–0.2)

## 2019-11-15 LAB — BASIC METABOLIC PANEL
Anion gap: 9 (ref 5–15)
BUN: 31 mg/dL — ABNORMAL HIGH (ref 8–23)
CO2: 27 mmol/L (ref 22–32)
Calcium: 8.1 mg/dL — ABNORMAL LOW (ref 8.9–10.3)
Chloride: 94 mmol/L — ABNORMAL LOW (ref 98–111)
Creatinine, Ser: 1.44 mg/dL — ABNORMAL HIGH (ref 0.61–1.24)
GFR calc Af Amer: 55 mL/min — ABNORMAL LOW (ref >60)
GFR calc non Af Amer: 47 mL/min — ABNORMAL LOW (ref >60)
Glucose, Bld: 264 mg/dL — ABNORMAL HIGH (ref 70–99)
Potassium: 3.9 mmol/L (ref 3.5–5.1)
Sodium: 130 mmol/L — ABNORMAL LOW (ref 135–145)

## 2019-11-15 LAB — CBG MONITORING, ED: Glucose-Capillary: 240 mg/dL — ABNORMAL HIGH (ref 70–99)

## 2019-11-15 MED ORDER — SODIUM CHLORIDE 0.9% FLUSH: 3.0000 mL | Freq: Once | INTRAVENOUS | Status: DC

## 2019-11-15 MED ORDER — SODIUM CHLORIDE 0.9 % IV BOLUS: 500.0000 mL | Freq: Once | INTRAVENOUS | Status: DC

## 2019-11-15 MED ORDER — SODIUM CHLORIDE 0.9 % IV BOLUS
1000.0000 mL | Freq: Once | INTRAVENOUS | Status: AC
  Administered 2019-11-15: 1000 mL via INTRAVENOUS

## 2019-11-15 MED ORDER — SODIUM CHLORIDE (PF) 0.9 % IJ SOLN
INTRAMUSCULAR | Status: AC
  Filled 2019-11-15: qty 50

## 2019-11-15 MED ORDER — IOHEXOL 350 MG/ML SOLN
80.0000 mL | Freq: Once | INTRAVENOUS | Status: AC | PRN
  Administered 2019-11-15: 80 mL via INTRAVENOUS

## 2019-11-15 NOTE — Progress Notes (Signed)
[facility] responded to Code [employer]. Pt with syncopal episode and sitting upright talking upon arrival at bedside in [facility]. No IV access needed at this time.

## 2019-11-15 NOTE — ED Notes (Signed)
Patient given juice, peanut butter graham crackers

## 2019-11-15 NOTE — Progress Notes (Signed)
Code blue called on L3. Arrived to machine with several therapists trying to support patient on table. Patient appeared dazed and hunched over. Therapists stated they were trying to swing patient's legs onto the table and his eyes rolled back in his head and he stopped responding to verbal commands. They reported he passed out earlier today while outside in the heat. Addressed patient and he slowly began responding verbally. His responses were sluggish at first, but he eventually began to to answer more promptly. A/Ox4. Left pupal appeared more constricted than right and was not as reactive as right to flashlight. He denied any chest pain, shortness of breath, headaches, blurry vision, nausea, ringing in ears, or dizziness. He did not recall how he felt before syncopy other then his head was foggy. Denied any history of seizures or syncopal episodes before the 2 he had today. CBG checked and 310. Vitals stable. RR and code team arrived at scene, and it was decided to take patient to ED for further workup. Patient's belongings gathered, and patient transported to ED via stretcher by RR-RNs.    11/15/19 1435  Vitals  BP 123/72  BP Location Right Arm  Patient Position Sitting  Pulse Rate 84  Resp 16  Pulse Oximetry  SpO2 98 %

## 2019-11-15 NOTE — ED Triage Notes (Signed)
LOC x2 today; most recent event at the [facility] while there for radiation; pt noted to hypotensive with triage.

## 2019-11-15 NOTE — ED Notes (Signed)
Pt transported to CT ?

## 2019-11-15 NOTE — Discharge Instructions (Signed)
Please make sure you are eating iron rich foods and are staying hydrated Do not take your Cardura tonight - please talk to you doctor about possibly adjusting your blood pressure medicines Return to the ER for any worsening symptoms

## 2019-11-15 NOTE — ED Provider Notes (Signed)
**Note Dustin-Identified via Obfuscation** [place] DEPT Provider Note   CSN: [id number] Arrival date & time: 11/15/19  1455     History Chief Complaint  Patient presents with  . Loss of Consciousness    [name] is a 74 y.o. male with history of lung cancer currently undergoing chemo and radiation, DM, CKD who presents with syncope. Pt was recently admitted for his lung cancer because of hemoptysis and obstruction of the RLL bronchus. He states he had 2 syncopal episodes today. The first happened while he was outside talking to a friend. He thinks he may have got overheated and he had lightheadedness. His friend caught him and called 911. EMS responded and had him drink some gatorade. He felt back to normal and then drove here because he had an appointment at the cancer center for radiation. Then while waiting he had another syncopal episode. Code blue was activated however pt regained consciousness quickly and felt back to baseline. He was advised to come to the ED to be checked out. BP is noted to be low in triage (85/51). He takes his BP meds as prescribed and denies any recent changes. He did start a new chemo treatment this week. He denies fever, headache, chest pain, SOB, abdominal pain, leg swelling. He reports his blood sugar has been running high which he attributes to chemo. He denies any GI bleeding and has not been coughing up blood.   HPI     Past Medical History:  Diagnosis Date  . Allergic rhinitis due to pollen   . Cancer ([place])   . Chronic kidney disease, stage III (moderate) 12/19/2012  . Depression   . Diabetes mellitus ([place])   . Diverticulosis   . Erectile dysfunction associated with type 2 diabetes mellitus ([place]) 05/28/2015  . Former very heavy cigarette smoker (more than 40 per day) 10/07/2014  . GERD (gastroesophageal reflux disease)   . Gout   . Hyperlipidemia   . Hypertension   . Hypogonadism male 06/14/2012  . Iron deficiency   . Memory loss   . Osteoarthritis     . Personal history of colonic polyps    1996    Patient Active Problem List   Diagnosis Date Noted  . Non-small cell carcinoma of right lung, stage 3 ([place]) 10/23/2019  . Goals of care, counseling/discussion 10/23/2019  . Encounter for antineoplastic chemotherapy 10/23/2019  . Lung mass 10/17/2019  . Small cell lung cancer ([place]) 10/12/2019  . Hemoptysis 10/12/2019  . PND (post-nasal drip) 09/20/2019  . Dropfoot 03/15/2019  . Mild cognitive impairment 05/08/2018  . Esophageal dysphagia   . Diabetic foot ulcer ([place]) 06/10/2017  . Uncontrolled type 2 diabetes mellitus with peripheral neuropathy ([place]) 05/11/2016  . Erectile dysfunction associated with type 2 diabetes mellitus ([place]) 05/28/2015  . Former very heavy cigarette smoker (more than 40 per day) 10/07/2014  . Chronic kidney disease, stage III (moderate) 12/19/2012  . Hypogonadism male 06/14/2012  . OSA (obstructive sleep apnea) 03/09/2011  . Diabetic neuropathy ([place]) 12/29/2010  . Family history of MI (myocardial infarction) 09/16/2010  . HIP REPLACEMENT, RIGHT, HX OF 02/05/2010  . Hypertriglyceridemia 06/13/2008  . GOUT 06/13/2008  . Major depressive disorder, recurrent episode, in partial remission ([place]) 06/13/2008  . Essential hypertension 06/13/2008  . Allergic rhinitis with postnasal drip 06/13/2008  . GERD 06/13/2008  . OSTEOARTHRITIS 06/13/2008    Past Surgical History:  Procedure Laterality Date  . BRONCHIAL NEEDLE ASPIRATION BIOPSY  10/17/2019   Procedure: BRONCHIAL NEEDLE ASPIRATION BIOPSIES;  Surgeon: Dustin Doom, MD;  Location: [facility];  Service: Cardiopulmonary;;  . CARDIAC CATHETERIZATION  11/2011   [facility]  . CHOLECYSTECTOMY  1980  . ENDOBRONCHIAL ULTRASOUND Bilateral 10/17/2019   Procedure: ENDOBRONCHIAL ULTRASOUND;  Surgeon: Dustin Doom, MD;  Location: [facility];  Service: Cardiopulmonary;  Laterality: Bilateral;  . ESOPHAGOGASTRODUODENOSCOPY (EGD) WITH PROPOFOL N/A 09/27/2017    Procedure: ESOPHAGOGASTRODUODENOSCOPY (EGD) WITH PROPOFOL;  Surgeon: Dustin Landsman, MD;  Location: [facility];  Service: Gastroenterology;  Laterality: N/A;  . EYE SURGERY Right 07/29/2016   [facility] - Dr. Manuella [name]  . EYE SURGERY Left 08/23/2106   [facility]act - Dr. Manuella [name]  . HEMOSTASIS CONTROL  10/17/2019   Procedure: HEMOSTASIS CONTROL;  Surgeon: Dustin Doom, MD;  Location: [facility];  Service: Cardiopulmonary;;  . TOTAL HIP ARTHROPLASTY  2009   Dr. Gladstone [name]  . TOTAL HIP ARTHROPLASTY         Family History  Problem Relation Age of Onset  . Diabetes Mother   . Alzheimer's disease Mother   . Diabetes Father   . Lung cancer Father        Age 76  . Stroke Father   . Heart attack Father   . Lung cancer Sister        lung  . Heart disease Maternal Grandfather   . COPD Sister   . Heart attack Sister   . Heart disease Sister     Social History   Tobacco Use  . Smoking status: Former Smoker    Packs/day: 3.00    Years: 35.00    Pack years: 105.00    Types: Cigarettes    Quit date: 04/12/2000    Years since quitting: 19.6  . Smokeless tobacco: Never Used  Vaping Use  . Vaping Use: Never used  Substance Use Topics  . Alcohol use: No  . Drug use: No    Home Medications Prior to Admission medications   Medication Sig Start Date End Date Taking? Authorizing Provider  acetaminophen (QC ACETAMINOPHEN 8HR ARTH PAIN) 650 MG CR tablet Take 650 mg by mouth every 8 (eight) hours as needed for pain.    [provider]  ampicillin (PRINCIPEN) 500 MG capsule Take 500 mg by mouth daily. 10/24/19   [provider]  Ascorbic Acid (VITAMIN C) 1000 MG tablet Take 1,000 mg by mouth daily.    [provider]  atenolol (TENORMIN) 50 MG tablet TAKE 1 TABLET BY MOUTH EVERY DAY Patient taking differently: Take 50 mg by mouth daily.  08/09/19   [name], Dustin Hamman, MD  cholecalciferol (VITAMIN D) 1000 UNITS tablet Take 1,000 Units by mouth daily.      [provider]  donepezil (ARICEPT) 10 MG tablet TAKE 1 TABLET BY MOUTH AT BEDTIME Patient taking differently: Take 10 mg by mouth at bedtime.  08/24/19   [name], Dustin Hamman, MD  doxazosin (CARDURA) 8 MG tablet TAKE ONE-HALF TABLET AT BEDTIME Patient taking differently: Take 4 mg by mouth daily.  01/26/19   [name], Dustin Hamman, MD  fluticasone (FLONASE) 50 MCG/ACT nasal spray PLACE 2 SPRAYS IN EACH NOSTRIL DAILY Patient taking differently: Place 2 sprays into both nostrils daily as needed for allergies or rhinitis.  01/26/19   [name], Dustin Hamman, MD  guaiFENesin-dextromethorphan (ROBITUSSIN DM) 100-10 MG/5ML syrup Take 5 mLs by mouth every 6 (six) hours as needed for cough. 10/14/19   Arrien, Jimmy Picket, MD  Hyprom-Naphaz-Polysorb-Zn Sulf (CLEAR EYES COMPLETE OP) Place 1 drop into both eyes daily as needed (dry eyes).  [provider]  Insulin Pen Needle (PEN NEEDLES) 31G X 5 MM MISC 1 each by Does not apply route 3 (three) times daily. To inject insulin E11.42 09/20/19   Philemon Kingdom, MD  insulin regular human CONCENTRATED (HUMULIN R U-500 KWIKPEN) 500 UNIT/ML kwikpen Inject under skin 80-90 units before b'fast, 60 units before lunch, 80-90 units vefore dinner Patient taking differently: Inject 20-40 Units into the skin See admin instructions. Takes 35-40 units before breakfast, 20 units at lunch and 35-40 units at night 09/17/19   Philemon Kingdom, MD  lisinopril (ZESTRIL) 10 MG tablet TAKE 1 TABLET BY MOUTH EVERY DAY Patient taking differently: Take 10 mg by mouth daily.  01/26/19   [name], Dustin Hamman, MD  memantine (NAMENDA) 10 MG tablet TAKE 1 TABLET BY MOUTH TWICE A DAY Patient taking differently: Take 10 mg by mouth 2 (two) times daily.  09/20/19   [name], Dustin Hamman, MD  montelukast (SINGULAIR) 10 MG tablet Take 1 tablet (10 mg total) by mouth at bedtime. 09/20/19   Bedsole, Amy E, MD  omeprazole (PRILOSEC) 40 MG capsule Take 1 capsule (40 mg total) by mouth 2 (two) times daily before a  meal. Patient taking differently: Take 40 mg by mouth daily.  08/21/18 04/06/28  [name], Dustin Hamman, MD  prochlorperazine (COMPAZINE) 10 MG tablet Take 1 tablet (10 mg total) by mouth every 6 (six) hours as needed. 10/23/19   Heilingoetter, Cassandra L, [name]  simvastatin (ZOCOR) 40 MG tablet TAKE 1 TABLET BY MOUTH AT BEDTIME Patient taking differently: Take 40 mg by mouth daily.  04/30/19   [name], Dustin Hamman, MD  sucralfate (CARAFATE) 1 g tablet Take 1 tablet (1 g total) by mouth 4 (four) times daily. Dissolve each tablet in 15 cc water before use. 11/02/19   Kyung Rudd, MD  venlafaxine XR (EFFEXOR-XR) 75 MG 24 hr capsule TAKE 1 CAPSULE BY MOUTH EVERY MORNING AND 2 CAPSULES AT BEDTIME Patient taking differently: Take 75 mg by mouth See admin instructions. Takes 1 capsule by mouth in the morning and 2 capsules at night. 01/27/19   [name], Dustin Hamman, MD    Allergies    Tetracycline  Review of Systems   Review of Systems  Constitutional: Negative for chills and fever.  Respiratory: Negative for cough and shortness of breath.   Cardiovascular: Negative for chest pain and leg swelling.  Gastrointestinal: Negative for abdominal pain, blood in stool, diarrhea, nausea and vomiting.  Allergic/Immunologic: Positive for immunocompromised state.  Neurological: Positive for syncope and light-headedness. Negative for headaches.  Hematological: Does not bruise/bleed easily.  All other systems reviewed and are negative.   Physical Exam Updated Vital Signs BP (!) 85/51   Pulse 83   Temp 97.8 F (36.6 C) (Oral)   Resp 16   SpO2 99%   Physical Exam Vitals and nursing note reviewed.  Constitutional:      General: He is not in acute distress.    Appearance: Normal appearance. He is well-developed. He is not ill-appearing.     Comments: Chronically ill appearing. NAD. Talking on the phone  HENT:     Head: Normocephalic and atraumatic.  Eyes:     General: No scleral icterus.       Right eye: No  discharge.        Left eye: No discharge.     Conjunctiva/sclera: Conjunctivae normal.     Pupils: Pupils are equal, round, and reactive to light.  Cardiovascular:     Rate and Rhythm: Normal rate and regular rhythm.  Pulmonary:  Effort: Pulmonary effort is normal. No respiratory distress.     Breath sounds: Normal breath sounds.  Abdominal:     General: There is no distension.     Palpations: Abdomen is soft.     Tenderness: There is no abdominal tenderness.  Musculoskeletal:     Cervical back: Normal range of motion.     Right lower leg: No edema.     Left lower leg: No edema.  Skin:    General: Skin is warm and dry.  Neurological:     Mental Status: He is alert and oriented to person, place, and time.  Psychiatric:        Behavior: Behavior normal.     ED Results / Procedures / Treatments   Labs (all labs ordered are listed, but only abnormal results are displayed) Labs Reviewed  BASIC METABOLIC PANEL - Abnormal; Notable for the following components:      Result Value   Sodium 130 (*)    Chloride 94 (*)    Glucose, Bld 264 (*)    BUN 31 (*)    Creatinine, Ser 1.44 (*)    Calcium 8.1 (*)    GFR calc non Af Amer 47 (*)    GFR calc Af Amer 55 (*)    All other components within normal limits  CBC - Abnormal; Notable for the following components:   RBC 3.70 (*)    Hemoglobin 10.3 (*)    HCT 30.8 (*)    Platelets 146 (*)    All other components within normal limits  URINALYSIS, ROUTINE W REFLEX MICROSCOPIC - Abnormal; Notable for the following components:   Glucose, UA >=500 (*)    Hgb urine dipstick SMALL (*)    Protein, ur >=300 (*)    Bacteria, UA RARE (*)    All other components within normal limits  CBG MONITORING, ED - Abnormal; Notable for the following components:   Glucose-Capillary 240 (*)    All other components within normal limits  TROPONIN I (HIGH SENSITIVITY)  TROPONIN I (HIGH SENSITIVITY)    EKG EKG Interpretation  Date/Time:  Thursday  November 15 2019 15:19:19 EDT Ventricular Rate:  81 PR Interval:    QRS Duration: 96 QT Interval:  381 QTC Calculation: 443 R Axis:   36 Text Interpretation: Sinus rhythm Borderline repolarization abnormality 12 Lead; [place] Confirmed by Dewaine Conger [phone]) on 11/15/2019 3:41:35 PM   Radiology CT Angio Chest PE W/Cm &/Or Wo Cm  Result Date: 11/15/2019 CLINICAL DATA:  Syncope. EXAM: CT ANGIOGRAPHY CHEST WITH CONTRAST TECHNIQUE: Multidetector CT imaging of the chest was performed using the standard protocol during bolus administration of intravenous contrast. Multiplanar CT image reconstructions and MIPs were obtained to evaluate the vascular anatomy. CONTRAST:  5mL OMNIPAQUE IOHEXOL 350 MG/ML SOLN COMPARISON:  CT chest dated October 11, 2019 FINDINGS: Cardiovascular: Contrast injection is sufficient to demonstrate satisfactory opacification of the pulmonary arteries to the segmental level. There is no pulmonary embolus or evidence of right heart strain. The size of the main pulmonary artery is normal. Heart size is normal, with no pericardial effusion. There are coronary artery calcifications. There are minimal atherosclerotic changes of the thoracic aorta without evidence for an aneurysm. There is no significant pericardial effusion. Mediastinum/Nodes: --again noted are pathologically enlarged mediastinal lymph nodes. The dominant lymph node measures approximately 2.4 x 4.3 cm (previously measuring 2.7 by 6 cm). --there are pathologically enlarged right hilar lymph nodes. -- No axillary lymphadenopathy. -- No supraclavicular lymphadenopathy. --there is a right-sided  hypoattenuating thyroid nodule measuring approximately 1.5 cm (axial series 4, image 9). -  Unremarkable esophagus. Lungs/Pleura: Again noted is a right hilar mass. This mass has demonstrated significant interval decrease in size and currently measures approximately 4.7 x 4.8 cm (previously measuring approximately 6.1 x 5.8 cm). There is new  cavitation within the central aspect of this mass. There is no pneumothorax. No large pleural effusion. Upper Abdomen: Contrast bolus timing is not optimized for evaluation of the abdominal organs. The patient is status post prior cholecystectomy. There is a probable nonobstructing stone in the upper pole the left kidney. There is variant hepatic arterial anatomy with a replaced common hepatic artery. Musculoskeletal: There are healing fractures of the anterior there through fifth ribs on the patient's left. There is no new acute displaced fracture. Review of the MIP images confirms the above findings. IMPRESSION: 1. No evidence for acute pulmonary embolism. 2. Findings consistent with a positive response to treatment as evidence by interval decrease in size of the right hilar mass and mediastinal lymph nodes. 3. Right-sided hypoattenuating thyroid nodule measuring approximately 1.5 cm. Outpatient follow-up ultrasound is recommended if it has not already been performed.(Ref: J Am Coll Radiol. 2015 Feb;12(2): 143-50). 4. Healing left-sided rib fractures. Aortic Atherosclerosis (ICD10-I70.0). Electronically Signed   By: Constance Holster M.D.   On: 11/15/2019 18:37    Procedures Procedures (including critical care time)  Medications Ordered in ED Medications  sodium chloride flush (NS) 0.9 % injection 3 mL (0 mLs Intravenous Not Given 11/15/19 2052)  sodium chloride 0.9 % bolus 1,000 mL (0 mLs Intravenous Stopped 11/15/19 2051)  iohexol (OMNIPAQUE) 350 MG/ML injection 80 mL (80 mLs Intravenous Contrast Given 11/15/19 1809)    ED Course  I have reviewed the triage vital signs and the nursing notes.  Pertinent labs & imaging results that were available during my care of the patient were reviewed by me and considered in my medical decision making (see chart for details).  Clinical Course as of Nov 15 2118  Thu Nov 15, 2019  1846 BP(!): 152/84 [AS]  1846 BP: 129/72 [AS]  1846 BP(!): 85/51 [AS]  1847 BP:  129/72 [AS]  1926 Glucose(!): 264 [AS]    Clinical Course User Index [AS] Burke Keels, Student-PA   74 year old male presents with 2 syncopal episodes today. BP is low in triage and IVF were started. EKG is NSR. Shared visit with Dr. Ron Parker. Will obtain labs, orthostatics, and CTA chest to r/o PE.  CBC shows mildly worse anemia (10). BMP shows mild hyponatremia. His SCr is stable. First and 2nd trop are 12 and 14 respectively. Orthostatics were obtained and are positive so likely his syncope is related to this  Orthostatic VS for the past 24 hrs (Last 3 readings):  BP- Lying Pulse- Lying BP- Sitting Pulse- Sitting BP- Standing at 0 minutes Pulse- Standing at 0 minutes BP- Standing at 3 minutes Pulse- Standing at 3 minutes  11/15/19 1636 140/77 96 103/62 76 96/48 78 91/55 78   He was given IVF and feels improved. CTA chest is negative for PE and shows slightly smaller mass. PT was encouraged to eat and hydrate well and to hold his BP meds tonight. He was advised to f/u with his PCP and oncologist. Return precautions given.  MDM Rules/Calculators/A&P                           Final Clinical Impression(s) / ED Diagnoses Final diagnoses:  Loss of consciousness [facility])    Rx / DC Orders ED Discharge Orders    None       Iris Pert 11/15/19 2132    Breck Coons, MD 11/16/19 [phone]

## 2019-11-16 ENCOUNTER — Ambulatory Visit: Payer: Medicare Other

## 2019-11-16 ENCOUNTER — Telehealth: Payer: Self-pay

## 2019-11-16 ENCOUNTER — Other Ambulatory Visit: Payer: Self-pay

## 2019-11-16 ENCOUNTER — Inpatient Hospital Stay: Payer: Medicare Other

## 2019-11-16 ENCOUNTER — Ambulatory Visit
Admission: RE | Admit: 2019-11-16 | Discharge: 2019-11-16 | Disposition: A | Discharge status: Home / Self Care | Payer: Medicare Other | Source: Ambulatory Visit | Attending: Radiation Oncology | Admitting: Radiation Oncology

## 2019-11-16 VITALS — BP 105/60 | HR 90 | Temp 98.1°F | Resp 18

## 2019-11-16 DIAGNOSIS — C3491 Malignant neoplasm of unspecified part of right bronchus or lung: Secondary | ICD-10-CM

## 2019-11-16 DIAGNOSIS — C3411 Malignant neoplasm of upper lobe, right bronchus or lung: Secondary | ICD-10-CM | POA: Diagnosis not present

## 2019-11-16 DIAGNOSIS — N183 Chronic kidney disease, stage 3 unspecified: Secondary | ICD-10-CM | POA: Diagnosis not present

## 2019-11-16 DIAGNOSIS — Z5111 Encounter for antineoplastic chemotherapy: Secondary | ICD-10-CM | POA: Diagnosis not present

## 2019-11-16 DIAGNOSIS — Z510 Encounter for antineoplastic radiation therapy: Secondary | ICD-10-CM | POA: Diagnosis not present

## 2019-11-16 DIAGNOSIS — C3431 Malignant neoplasm of lower lobe, right bronchus or lung: Secondary | ICD-10-CM | POA: Diagnosis not present

## 2019-11-16 DIAGNOSIS — E1122 Type 2 diabetes mellitus with diabetic chronic kidney disease: Secondary | ICD-10-CM | POA: Diagnosis not present

## 2019-11-16 LAB — GLUCOSE, CAPILLARY: Glucose-Capillary: 310 mg/dL — ABNORMAL HIGH (ref 70–99)

## 2019-11-16 MED ORDER — PEGFILGRASTIM-CBQV 6 MG/0.6ML ~~LOC~~ SOSY
6.0000 mg | PREFILLED_SYRINGE | Freq: Once | SUBCUTANEOUS | Status: AC
  Administered 2019-11-16: 6 mg via SUBCUTANEOUS

## 2019-11-16 MED ORDER — PEGFILGRASTIM-CBQV 6 MG/0.6ML ~~LOC~~ SOSY
PREFILLED_SYRINGE | SUBCUTANEOUS | Status: AC
  Filled 2019-11-16: qty 0.6

## 2019-11-16 NOTE — Telephone Encounter (Signed)
I spoke with pt; pt did not go to UC; pt said BP holding the same and now BP is 111/72 P 88. Pt has not taking the three BP meds since 11/15/19. Pt wants to know if there is something he can do or med to take to raise his BP. Pt has appt with oncologist and radiation treatment this afternoon at 2:30 pm. If pt condition changes or worsens he will go to UC. Pt does request cb after Dr Lorelei Pont reviews question about BP. Sending note to Dr Lorelei Pont and Butch Penny CMA.

## 2019-11-16 NOTE — Telephone Encounter (Signed)
[facility] - Client TELEPHONE ADVICE RECORD [facility]urse Patient Name: [name] Gender: Male DOB: Oct 27, 1945 Age: 74 Y 33 D Return Phone Number: [phone] (Primary) [place] [address] Client [facility] - Client Client Site [place] Day Physician Copland, Frederico Hamman - MD Contact Type Call Who Is Calling Patient / Member / Family / Caregiver Call Type Triage / Clinical Relationship To Patient Self Return Phone Number [phone] (Primary) Chief Complaint FAINTING or [place] Reason for Call Symptomatic / Request for Health Information Initial Comment Transferred from answering service, PT has been passing out since yesterday, low BP at 99/60. PT does have cancer. Translation No Nurse Assessment Nurse: Gildardo Pounds, RN, Amy Date/Time (Eastern Time): 11/16/2019 9:46:04 AM Confirm and document reason for call. If symptomatic, describe symptoms. ---Caller states he has been passing out since yesterday. It came on all of a sudden. He was just talking to a friend & he went down. The fire dept. came & said he was fine after he cooled down. He then went to the hospital for radiation & he passed out then. They said he fell back & his eyes rolled to the back of his head. They called a Code Blue. His BP is low at 99/60. It is 100/67 today. Patient does have Stage 3 non small cell lung cancer. He was evaluated in the ER. They did bloodwork & CT scan. His BP would drop when they took it while sitting & standing. He is on 3 BP meds & thinks he needs to stop one of them. He feels totally fine today. Has the patient had close contact with a person known or suspected to have the novel coronavirus illness OR traveled / lives in area with major community spread (including international travel) in the last 14 days from the onset of symptoms? * If Asymptomatic, screen for exposure and  travel within the last 14 days. ---No Does the patient have any new or worsening symptoms? ---Yes Will a triage be completed? ---Yes Related visit to physician within the last 2 weeks? ---Yes Does the PT have any chronic conditions? (i.e. diabetes, asthma, this includes High risk factors for pregnancy, etc.) ---Yes List chronic conditions. ---lung cancer Is this a behavioral health or substance abuse call? ---No PLEASE NOTE: All timestamps contained within this report are represented as [place] Standard Time. CONFIDENTIALTY NOTICE: This fax transmission is intended only for the addressee. It contains information that is legally privileged, confidential or otherwise protected from use or disclosure. If you are not the intended recipient, you are strictly prohibited from reviewing, disclosing, copying using or disseminating any of this information or taking any action in reliance on or regarding this information. If you have received this fax in error, please notify us immediately by telephone so that we can arrange for its return to [place]. Phone: [phone], Toll-Free: [phone], Fax: [phone] Page: 2 of 2 Call Id: [id number] Guidelines Guideline Title Affirmed Question Affirmed Notes Nurse Date/Time Eilene Ghazi Time) Fainting [1] All other patients AND [2] now alert and feels fine (Exception: SIMPLE FAINT due to stress, pain, prolonged standing, or suddenly standing) Lovelace, RN, Amy 11/16/2019 9:51:27 AM Disp. Time Eilene Ghazi Time) Disposition Final User 11/16/2019 9:44:18 AM Send to Urgent Queue Silvano Rusk 11/16/2019 10:02:56 AM See HCP within 4 Hours (or PCP triage) Yes Lovelace, RN, Amy Caller Disagree/Comply Comply Caller Understands Yes PreDisposition InappropriateToAsk Care Advice Given Per Guideline SEE HCP WITHIN 4 HOURS (OR  PCP TRIAGE): * IF OFFICE WILL BE OPEN: You need to be seen within the next 3 or 4 hours. Call your doctor (or NP/PA) now or as soon as the office  opens. CALL BACK IF: * You become worse. CARE ADVICE given per Fainting (Adult) guideline. Comments User: Wayne Sever, RN Date/Time Eilene Ghazi Time): 11/16/2019 10:03:50 AM Spoke to Shirlean Mylar who consulted with [facility]. They do not have any available appts. They recommended the patient go to UC.

## 2019-11-16 NOTE — Progress Notes (Signed)
[name] OFFICE PROGRESS NOTE  [name], MD [place] [id number]  DIAGNOSIS:  Limited stage small cell lung cancer.He presented with a large right lower lobe/right hilar mass with obstruction of the right lower lobe bronchus. The patient also has associated bulky right lower paratracheal and subcarinal adenopathy.He was diagnosed in July 2021.  PRIOR THERAPY: 1) weekly concurrent chemoradiation with carboplatin for an AUC of 2 and paclitaxel 45 mg/m.  First dose 10/29/2019.  Status post 1 cycle. Discontinued due to change in diagnosis.   CURRENT THERAPY:  Systemic chemotherapy with carboplatin for an AUC of 5 on day 1 and etoposide 100 mg per metered squared on days 1, 2, and 3 IV every 3 weeks with Neulasta support.  First dose expected on 11/12/2019. Status post 1 cycle.  Undergoing concurrent radiation under the care of Dr. Lisbeth Renshaw for the obstructive lung mass.   INTERVAL HISTORY: [name] 74 y.o. male returns to the clinic today for a follow up visit. The patient is feeling well today. In the interval since his last appointment, he completed his first cycle of his new chemotherapy regimen. His first dose was 11/12/19. Last week on 8/5, the patient had two syncopal episodes. The first episode took place prior to his radiation oncology appointment. He had been outside talking to a friend and had a syncopal episode. EMS was called. He was given gatorade. He had improvement in his symptoms and drove to his appointment. He had a second syncopal episode in radiation oncology and was transferred to the ER for further evaluation/managment. He was hypotensive in the ER. Work up was negative for PE. EGK was in NSR. Had evidence of hyperglycemia. Orthostatics positive. Given IVF. His PCP discontinued his BP meds. He states he checks his BP at home.   Today, he is feeling well. He was running late this AM and did not take his diabetes medication.Today, he  denies any fever, night sweats, or chills.  Denies weight loss. The patient states that he has a good appetite. He denies chest pain and the achiness from his subacute rib fractures has resolved.  He reports very mild shortness of breath.  He reports his mild baseline cough which produces clear sputum which he believes is secondary to post nasal drainage.  Denies any vomiting or nausea. He had some diarrhea for which he took imodium which caused cnstipation. He believes he had dairrhea last week when he had hypotension but can not confirm the dates he had diarrhea.  Denies any headache or visual changes. He is here for a 1 week follow up after completing his first cycle of chemotherapy.    MEDICAL HISTORY: Past Medical History:  Diagnosis Date  . Allergic rhinitis due to pollen   . Cancer ([place])   . Chronic kidney disease, stage III (moderate) 12/19/2012  . Depression   . Diabetes mellitus ([place])   . Diverticulosis   . Erectile dysfunction associated with type 2 diabetes mellitus ([place]) 05/28/2015  . Former very heavy cigarette smoker (more than 40 per day) 10/07/2014  . GERD (gastroesophageal reflux disease)   . Gout   . Hyperlipidemia   . Hypertension   . Hypogonadism male 06/14/2012  . Iron deficiency   . Memory loss   . Osteoarthritis   . Personal history of colonic polyps    1996    ALLERGIES:  is allergic to tetracycline.  MEDICATIONS:  Current Outpatient Medications  Medication Sig Dispense Refill  .  acetaminophen (QC ACETAMINOPHEN 8HR ARTH PAIN) 650 MG CR tablet Take 650 mg by mouth every 8 (eight) hours as needed for pain.    [name] Kitchen ampicillin (PRINCIPEN) 500 MG capsule Take 500 mg by mouth daily as needed (flare up).     . Ascorbic Acid (VITAMIN C) 1000 MG tablet Take 1,000 mg by mouth daily.    . cholecalciferol (VITAMIN D) 1000 UNITS tablet Take 1,000 Units by mouth daily.      [name] Kitchen donepezil (ARICEPT) 10 MG tablet TAKE 1 TABLET BY MOUTH AT BEDTIME (Patient taking differently: Take 10  mg by mouth at bedtime. ) 90 tablet 3  . doxazosin (CARDURA) 8 MG tablet TAKE ONE-HALF TABLET AT BEDTIME (Patient taking differently: Take 4 mg by mouth every evening. ) 45 tablet 3  . fluticasone (FLONASE) 50 MCG/ACT nasal spray PLACE 2 SPRAYS IN EACH NOSTRIL DAILY (Patient taking differently: Place 2 sprays into both nostrils daily as needed for allergies or rhinitis. ) 48 mL 3  . guaiFENesin-dextromethorphan (ROBITUSSIN DM) 100-10 MG/5ML syrup Take 5 mLs by mouth every 6 (six) hours as needed for cough. 118 mL 0  . Hyprom-Naphaz-Polysorb-Zn Sulf (CLEAR EYES COMPLETE OP) Place 1 drop into both eyes daily as needed (dry eyes).    . Insulin Pen Needle (PEN NEEDLES) 31G X 5 MM MISC 1 each by Does not apply route 3 (three) times daily. To inject insulin E11.42 90 each 2  . insulin regular human CONCENTRATED (HUMULIN R U-500 KWIKPEN) 500 UNIT/ML kwikpen Inject under skin 80-90 units before b'fast, 60 units before lunch, 80-90 units vefore dinner (Patient taking differently: Inject 20-40 Units into the skin See admin instructions. Takes 35-40 units before breakfast, 20 units at lunch and 35-40 units at night) 6 pen 5  . memantine (NAMENDA) 10 MG tablet TAKE 1 TABLET BY MOUTH TWICE A DAY (Patient taking differently: Take 10 mg by mouth 2 (two) times daily. ) 180 tablet 3  . montelukast (SINGULAIR) 10 MG tablet Take 1 tablet (10 mg total) by mouth at bedtime. 30 tablet 3  . omeprazole (PRILOSEC) 40 MG capsule Take 1 capsule (40 mg total) by mouth 2 (two) times daily before a meal. (Patient taking differently: Take 40 mg by mouth daily. ) 180 capsule 1  . prochlorperazine (COMPAZINE) 10 MG tablet Take 1 tablet (10 mg total) by mouth every 6 (six) hours as needed. 30 tablet 2  . simvastatin (ZOCOR) 40 MG tablet TAKE 1 TABLET BY MOUTH AT BEDTIME (Patient taking differently: Take 40 mg by mouth every evening. ) 90 tablet 2  . sucralfate (CARAFATE) 1 g tablet Take 1 tablet (1 g total) by mouth 4 (four) times daily.  Dissolve each tablet in 15 cc water before use. (Patient not taking: Reported on 11/15/2019) 120 tablet 2  . venlafaxine XR (EFFEXOR-XR) 75 MG 24 hr capsule TAKE 1 CAPSULE BY MOUTH EVERY MORNING AND 2 CAPSULES AT BEDTIME (Patient taking differently: Take 75 mg by mouth See admin instructions. Takes 1 capsule by mouth in the morning and 2 capsules at night.) 270 capsule 1   No current facility-administered medications for this visit.    SURGICAL HISTORY:  Past Surgical History:  Procedure Laterality Date  . BRONCHIAL NEEDLE ASPIRATION BIOPSY  10/17/2019   Procedure: BRONCHIAL NEEDLE ASPIRATION BIOPSIES;  Surgeon: Juanito Doom, MD;  Location: [facility];  Service: Cardiopulmonary;;  . CARDIAC CATHETERIZATION  11/2011   [facility]  . CHOLECYSTECTOMY  1980  . ENDOBRONCHIAL ULTRASOUND Bilateral 10/17/2019   Procedure:  ENDOBRONCHIAL ULTRASOUND;  Surgeon: Juanito Doom, MD;  Location: [facility];  Service: Cardiopulmonary;  Laterality: Bilateral;  . ESOPHAGOGASTRODUODENOSCOPY (EGD) WITH PROPOFOL N/A 09/27/2017   Procedure: ESOPHAGOGASTRODUODENOSCOPY (EGD) WITH PROPOFOL;  Surgeon: Lin Landsman, MD;  Location: [facility];  Service: Gastroenterology;  Laterality: N/A;  . EYE SURGERY Right 07/29/2016   [facility] - Dr. Manuella Ghazi  . EYE SURGERY Left 08/23/2106   [facility] - Dr. Manuella Ghazi  . HEMOSTASIS CONTROL  10/17/2019   Procedure: HEMOSTASIS CONTROL;  Surgeon: Juanito Doom, MD;  Location: [facility];  Service: Cardiopulmonary;;  . TOTAL HIP ARTHROPLASTY  2009   Dr. Gladstone Lighter  . TOTAL HIP ARTHROPLASTY      REVIEW OF SYSTEMS:   Review of Systems  Constitutional: Positive for fatigue.  Negative for appetite change, chills, or weight loss, and fever HENT: Negative for mouth sores, nosebleeds, sore throat and trouble swallowing.   Eyes: Negative for eye problems and icterus.  Respiratory: Positive for mild baseline cough and mild shortness of breath with exertion. Negative for wheezing.    Cardiovascular: Negative for chest pain and leg swelling.  Gastrointestinal: Positive for constipation and diarrhea. negative for abdominal pain, nausea and vomiting.  Genitourinary: Negative for bladder incontinence, difficulty urinating, dysuria, frequency and hematuria.   Musculoskeletal: Negative for back pain, gait problem, neck pain and neck stiffness.  Skin: Negative for itching and rash.  Neurological: Negative for dizziness, extremity weakness, gait problem, headaches, light-headedness and seizures.  Hematological: Negative for adenopathy. Does not bruise/bleed easily.  Psychiatric/Behavioral: Negative for confusion, depression and sleep disturbance. The patient is not nervous/anxious.     PHYSICAL EXAMINATION:  There were no vitals taken for this visit.  ECOG PERFORMANCE STATUS: 1 - Symptomatic but completely ambulatory  Physical Exam  Constitutional: Oriented to person, place, and time and well-developed, well-nourished, and in no distress.  HENT:  Head: Normocephalic and atraumatic.  Mouth/Throat: Oropharynx is clear and moist. No oropharyngeal exudate.  Eyes: Conjunctivae are normal. Right eye exhibits no discharge. Left eye exhibits no discharge. No scleral icterus.  Neck: Normal range of motion. Neck supple.  Cardiovascular: Normal rate, regular rhythm, normal heart sounds and intact distal pulses.   Pulmonary/Chest: Effort normal and breath sounds normal. No respiratory distress. No wheezes. No rales.  Abdominal: Soft. Bowel sounds are normal. Exhibits no distension and no mass. There is no tenderness.  Musculoskeletal: Normal range of motion. Exhibits no edema.  Lymphadenopathy:    No cervical adenopathy.  Neurological: Alert and oriented to person, place, and time. Exhibits normal muscle tone. Ambulates with a walker Skin: Bruising noted on right upper extremity from IV insertion sites. Skin is warm and dry. No rash noted. Not diaphoretic. No erythema. No pallor.   Psychiatric: Mood, memory and judgment normal.  Vitals reviewed.  LABORATORY DATA: Lab Results  Component Value Date   WBC 6.4 11/15/2019   HGB 10.3 (L) 11/15/2019   HCT 30.8 (L) 11/15/2019   MCV 83.2 11/15/2019   PLT 146 (L) 11/15/2019      Chemistry      Component Value Date/Time   NA 130 (L) 11/15/2019 1511   NA 130 (L) 11/25/2011 1119   NA 137 11/16/2011 0513   K 3.9 11/15/2019 1511   K 4.1 11/16/2011 0513   CL 94 (L) 11/15/2019 1511   CL 100 11/16/2011 0513   CO2 27 11/15/2019 1511   CO2 29 11/16/2011 0513   BUN 31 (H) 11/15/2019 1511   BUN 25 11/25/2011 1119   BUN  15 11/16/2011 0513   CREATININE 1.44 (H) 11/15/2019 1511   CREATININE 1.46 (H) 11/12/2019 1224   CREATININE 1.26 11/16/2011 0513      Component Value Date/Time   CALCIUM 8.1 (L) 11/15/2019 1511   CALCIUM 9.1 11/16/2011 0513   ALKPHOS 100 11/12/2019 1224   AST 8 (L) 11/12/2019 1224   ALT 8 11/12/2019 1224   BILITOT 0.3 11/12/2019 1224       RADIOGRAPHIC STUDIES:  CT Angio Chest PE W/Cm &/Or Wo Cm  Result Date: 11/15/2019 CLINICAL DATA:  Syncope. EXAM: CT ANGIOGRAPHY CHEST WITH CONTRAST TECHNIQUE: Multidetector CT imaging of the chest was performed using the standard protocol during bolus administration of intravenous contrast. Multiplanar CT image reconstructions and MIPs were obtained to evaluate the vascular anatomy. CONTRAST:  80mL OMNIPAQUE IOHEXOL 350 MG/ML SOLN COMPARISON:  CT chest dated October 11, 2019 FINDINGS: Cardiovascular: Contrast injection is sufficient to demonstrate satisfactory opacification of the pulmonary arteries to the segmental level. There is no pulmonary embolus or evidence of right heart strain. The size of the main pulmonary artery is normal. Heart size is normal, with no pericardial effusion. There are coronary artery calcifications. There are minimal atherosclerotic changes of the thoracic aorta without evidence for an aneurysm. There is no significant pericardial effusion.  Mediastinum/Nodes: --again noted are pathologically enlarged mediastinal lymph nodes. The dominant lymph node measures approximately 2.4 x 4.3 cm (previously measuring 2.7 by 6 cm). --there are pathologically enlarged right hilar lymph nodes. -- No axillary lymphadenopathy. -- No supraclavicular lymphadenopathy. --there is a right-sided hypoattenuating thyroid nodule measuring approximately 1.5 cm (axial series 4, image 9). -  Unremarkable esophagus. Lungs/Pleura: Again noted is a right hilar mass. This mass has demonstrated significant interval decrease in size and currently measures approximately 4.7 x 4.8 cm (previously measuring approximately 6.1 x 5.8 cm). There is new cavitation within the central aspect of this mass. There is no pneumothorax. No large pleural effusion. Upper Abdomen: Contrast bolus timing is not optimized for evaluation of the abdominal organs. The patient is status post prior cholecystectomy. There is a probable nonobstructing stone in the upper pole the left kidney. There is variant hepatic arterial anatomy with a replaced common hepatic artery. Musculoskeletal: There are healing fractures of the anterior there through fifth ribs on the patient's left. There is no new acute displaced fracture. Review of the MIP images confirms the above findings. IMPRESSION: 1. No evidence for acute pulmonary embolism. 2. Findings consistent with a positive response to treatment as evidence by interval decrease in size of the right hilar mass and mediastinal lymph nodes. 3. Right-sided hypoattenuating thyroid nodule measuring approximately 1.5 cm. Outpatient follow-up ultrasound is recommended if it has not already been performed.(Ref: J Am Coll Radiol. 2015 Feb;12(2): 143-50). 4. Healing left-sided rib fractures. Aortic Atherosclerosis (ICD10-I70.0). Electronically Signed   By: Constance Holster M.D.   On: 11/15/2019 18:37   MR Brain W Wo Contrast  Result Date: 11/04/2019 CLINICAL DATA:  Non-small  cell lung carcinoma staging EXAM: MRI HEAD WITHOUT AND WITH CONTRAST TECHNIQUE: Multiplanar, multiecho pulse sequences of the brain and surrounding structures were obtained without and with intravenous contrast. CONTRAST:  25mL GADAVIST GADOBUTROL 1 MMOL/ML IV SOLN COMPARISON:  None. FINDINGS: BRAIN: No acute infarct, acute hemorrhage or extra-axial collection. Multifocal white matter hyperintensity, most commonly due to chronic ischemic microangiopathy. There is generalized atrophy without lobar predilection. No chronic microhemorrhage. Normal midline structures. There is no abnormal contrast enhancement. VASCULAR: Major flow voids are preserved. SKULL AND UPPER CERVICAL  SPINE: Normal calvarium and skull base. Visualized upper cervical spine and soft tissues are normal. SINUSES/ORBITS: No paranasal sinus fluid levels or advanced mucosal thickening. No mastoid or middle ear effusion. Normal orbits. IMPRESSION: 1. No intracranial metastatic disease. 2. Generalized atrophy and chronic ischemic microangiopathy. Electronically Signed   By: Ulyses Jarred M.D.   On: 11/04/2019 20:56   NM PET Image Initial (PI) Skull Base To Thigh  Result Date: 11/01/2019 CLINICAL DATA:  Initial treatment strategy for lung cancer. EXAM: NUCLEAR MEDICINE PET SKULL BASE TO THIGH TECHNIQUE: 9.9 mCi F-18 FDG was injected intravenously. Full-ring PET imaging was performed from the skull base to thigh after the radiotracer. CT data was obtained and used for attenuation correction and anatomic localization. Fasting blood glucose: 342 mg/dl COMPARISON:  10/11/2019 FINDINGS: Mediastinal blood pool activity: SUV max 3.42 Liver activity: SUV max NA Note: Patient presents today with abnormally elevated fasting blood glucose at 342. Exam was attempted yesterday however the fasting blood glucose was even higher. Patient reports difficulty with achieving a normal blood glucose level and therefore the decision was made to proceed with the exam knowing  that a markedly elevated fasting blood sugar may reduce sensitivity of this exam as the radiotracer is an analog of blood glucose. Neck: No FDG avid mass or adenopathy identified. Incidental CT findings: None CHEST: Superior segment of right lower lobe perihilar lung mass is again noted. The mass appears internally necrotic with a maximum dimension of 6.4 cm with SUV max of 4.6, image 76/4. The tumor invades the mediastinum and has mass effect the posterior wall of the right mainstem bronchus. Encasement and narrowing of the proximal right upper lobe bronchus and bronchus intermedius noted. Signs of postobstructive pneumonitis versus lymphangitic spread of disease is noted within the posteromedial right upper lobe, image 67/4. Small subpleural nodule is identified within the posterior aspect of the superior segment of right lower lobe measuring 10 mm. Mild FDG uptake with SUV max of 1.3 is associated with this nodule, image 84/4. Sub solid, cystic lesion within the lateral right lung base measures approximately 1 cm without significant FDG uptake. No suspicious pulmonary nodule or mass within the contralateral lung. Large subcarinal lymph node has a short axis of 2.3 cm and an SUV max of 3.7. Right paratracheal lymph node measures 2.8 cm and appears predominantly necrotic within SUV max of 2.7. Adjacent right paratracheal lymph node measures 1.5 cm with SUV max of 3.7. No left-sided mediastinal or hilar adenopathy. No FDG avid supraclavicular or axillary lymph nodes. Incidental CT findings: Aortic atherosclerosis. Lad and RCA coronary artery calcifications. ABDOMEN/PELVIS: No FDG uptake within the liver, pancreas, spleen, or adrenal glands. No FDG avid lymph nodes within the abdomen or pelvis. Incidental CT findings: Cholecystectomy. Aortic atherosclerosis. 3 mm stone is identified within upper pole of left kidney. 3 cm cyst arises from the lateral cortex of the right kidney. SKELETON: No focal hypermetabolic  activity to suggest skeletal metastasis. Incidental CT findings: Previous right hip arthroplasty. Posttraumatic rib deformities are noted involving the anterior aspect of the left third and fourth ribs with expected mild FDG uptake. IMPRESSION: 1. Note: Patient presented for the second day in a row with markedly elevated fasting blood sugars. The radiotracer is a glucose analog and ideally the exam should be performed with a normal fasting blood glucose level as elevated blood glucose may diminish the sensitivity for detecting FDG avid disease. 2. FDG avid necrotic perihilar lung mass involving the superior segment of right lower lobe is  identified compatible with primary bronchogenic carcinoma. 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 Imaging findings are compatible with at least a T3N2M0 lesion, stage IIIb disease. 3. Involvement of the right mainstem bronchus, right upper lobe bronchi and bronchus intermedius noted. Postobstructive pneumonitis versus lymphangitic spread of disease is noted within the central portion of the posteromedial right upper lobe. 4. Enlarged FDG avid lymph nodes are identified within the right paratracheal nodal chain and subcarinal region. Dominant right paratracheal lymph node is centrally necrotic with expected decreased FDG uptake. 5. There are 2 small subpleural nodules within the posterior right lower lobe. Cannot rule out satellite lesions. 6. Sub solid, cystic nodule is noted within the lateral right lung base. No significant tracer uptake associated with this nodule. 7. No signs of metastatic disease to the abdomen or pelvis or skeletal structures. 8. Subacute, posttraumatic rib deformities are noted involving the anterior aspect of the left third and fourth ribs 9. Aortic Atherosclerosis (ICD10-I70.0) and Emphysema (ICD10-J43.9). 10. Multi vessel coronary artery atherosclerotic calcifications. Electronically Signed   By: Kerby Moors M.D.   On: 11/01/2019 10:56     ASSESSMENT/PLAN:  This is a very pleasant 74 year old Caucasian male diagnosed with limited stage small cell lung cancer.He presented with a large right lower lobe/right hilar mass with obstruction of the right lower lobe bronchus. The patient also has associated bulky right lower paratracheal and subcarinal adenopathy.He was diagnosed in July 2021.  The patient had 1 cycle of concurrent chemoradiation with carboplatin for an AUC of 2 and paclitaxel 45 mg/m.  He tolerated it well without any concerning complaints.  The patient undergoes radiotherapy under the care of Dr. Lisbeth Renshaw and Ebony Hail.  His last dose is expected on 8/6. This treatment was discontinued due to change in his final pathology report.   He is currently undergoing systemic chemotherapy with carboplatin for an AUC of 5 on day 1 and etoposide 100 mg per metered squared on days 1, 2, and 3 IV every 3 weeks with Neulasta support.  Given the patient's CKD, the patient is not a candidate for cisplatin.  He is status post 1 cycle. He had hypotension and a syncopal episode last week.   Today, he is feeling well.   The patient was seen with Dr. [name]. Labs were reviewed. Dr. [name] recommends he continue on his current treatment at the same dose.   We will see him back for a follow up visit in 2 weeks for evaluation before starting cycle #2.   The patient has several bruises on his upper extremities from lab draws/chemo. He inquired about a port-a-cath. We discussed this and he is interested in port-a-cath placement. I have sent EMLA cream to his pharmacy and will try to arrange for his port prior to his next cycle of treatment.   His blood sugar is elevated today. He was not able to take his insulin prior to his appointment today. We will give him 10 units of insulin in the clinic and he was advised to monitor his blood sugar closely at home.   He also was encouraged to monitor his  Blood  pressure daily. If he experiences diarrhea, he was encouraged to make sure he is staying hydrated. Also encouraged imodium use, perhaps a 1/2 of a tablet since he is sensitive to imodium.    The patient was advised to call immediately if he has any concerning symptoms in the interval. The patient voices understanding of current disease  status and treatment options and is in agreement with the current care plan. All questions were answered. The patient knows to call the clinic with any problems, questions or concerns. We can certainly see the patient much sooner if necessary  No orders of the defined types were placed in this encounter.    Raylee Strehl L Kennedy Brines, [name] 11/16/19  ADDENDUM: Hematology/oncology Attending: I had a face-to-face encounter with the patient today.  I recommended his care plan.  This is a very pleasant 74 years old white male recently diagnosed with limited stage small cell lung cancer presented with large right lower lobe/right hilar mass with obstruction of the right lower lobe bronchus and bulky right lower lobe paratracheal and subcarinal lymphadenopathy diagnosed in July 2021. The patient is currently undergoing systemic chemotherapy with carboplatin and etoposide status post 1 cycle.  He tolerated the first cycle of his treatment well except for one syncopal episode last week.  He was encouraged to increase his oral intake and monitor his blood pressure as well as diabetes mellitus. He will continue with his concurrent radiation as planned. We will see the patient back for follow-up visit in 2 weeks for evaluation before the next cycle of his treatment. For the diabetes mellitus, we will give the patient 10 units of regular insulin today and he was advised to monitor his blood sugar closely at home. He was advised to call immediately if he has any concerning symptoms in the interval.  Disclaimer: This note was dictated with voice recognition software. Similar  sounding words can inadvertently be transcribed and may be missed upon review. Eilleen Kempf, MD  11/19/19

## 2019-11-16 NOTE — Telephone Encounter (Signed)
Please call - I am at an appointment myself and cannot call now  He can stop ALL of his blood pressure medicine for now, and he absolutely needs to tell his oncologist and radiation oncologist.  I have no idea how the cancer drugs might influence his blood pressure.

## 2019-11-16 NOTE — Telephone Encounter (Signed)
Mr. [name] notified as instructed by telephone.  Patient states understanding.  Medication list updated.

## 2019-11-19 ENCOUNTER — Ambulatory Visit
Admission: RE | Admit: 2019-11-19 | Discharge: 2019-11-19 | Disposition: A | Discharge status: Home / Self Care | Payer: Medicare Other | Source: Ambulatory Visit | Attending: Radiation Oncology | Admitting: Radiation Oncology

## 2019-11-19 ENCOUNTER — Other Ambulatory Visit: Payer: Self-pay | Admitting: *Deleted

## 2019-11-19 ENCOUNTER — Inpatient Hospital Stay: Payer: Medicare Other | Admitting: Physician Assistant

## 2019-11-19 ENCOUNTER — Inpatient Hospital Stay: Payer: Medicare Other

## 2019-11-19 ENCOUNTER — Ambulatory Visit: Payer: Medicare Other

## 2019-11-19 ENCOUNTER — Encounter: Payer: Self-pay | Admitting: Physician Assistant

## 2019-11-19 ENCOUNTER — Encounter: Payer: Self-pay | Admitting: Internal Medicine

## 2019-11-19 ENCOUNTER — Other Ambulatory Visit: Payer: Self-pay

## 2019-11-19 VITALS — BP 157/85 | HR 124 | Temp 97.6°F | Resp 17 | Ht 71.0 in | Wt 196.7 lb

## 2019-11-19 DIAGNOSIS — I129 Hypertensive chronic kidney disease with stage 1 through stage 4 chronic kidney disease, or unspecified chronic kidney disease: Secondary | ICD-10-CM | POA: Diagnosis not present

## 2019-11-19 DIAGNOSIS — M19072 Primary osteoarthritis, left ankle and foot: Secondary | ICD-10-CM | POA: Diagnosis not present

## 2019-11-19 DIAGNOSIS — A419 Sepsis, unspecified organism: Secondary | ICD-10-CM | POA: Diagnosis not present

## 2019-11-19 DIAGNOSIS — L02612 Cutaneous abscess of left foot: Secondary | ICD-10-CM | POA: Diagnosis not present

## 2019-11-19 DIAGNOSIS — N179 Acute kidney failure, unspecified: Secondary | ICD-10-CM | POA: Diagnosis not present

## 2019-11-19 DIAGNOSIS — C3490 Malignant neoplasm of unspecified part of unspecified bronchus or lung: Secondary | ICD-10-CM

## 2019-11-19 DIAGNOSIS — K219 Gastro-esophageal reflux disease without esophagitis: Secondary | ICD-10-CM | POA: Diagnosis not present

## 2019-11-19 DIAGNOSIS — C3491 Malignant neoplasm of unspecified part of right bronchus or lung: Secondary | ICD-10-CM | POA: Diagnosis not present

## 2019-11-19 DIAGNOSIS — E872 Acidosis: Secondary | ICD-10-CM | POA: Diagnosis not present

## 2019-11-19 DIAGNOSIS — E1142 Type 2 diabetes mellitus with diabetic polyneuropathy: Secondary | ICD-10-CM

## 2019-11-19 DIAGNOSIS — R000 Tachycardia, unspecified: Secondary | ICD-10-CM | POA: Diagnosis not present

## 2019-11-19 DIAGNOSIS — E1169 Type 2 diabetes mellitus with other specified complication: Secondary | ICD-10-CM

## 2019-11-19 DIAGNOSIS — N1832 Chronic kidney disease, stage 3b: Secondary | ICD-10-CM | POA: Diagnosis not present

## 2019-11-19 DIAGNOSIS — E1165 Type 2 diabetes mellitus with hyperglycemia: Secondary | ICD-10-CM | POA: Diagnosis not present

## 2019-11-19 DIAGNOSIS — E781 Pure hyperglyceridemia: Secondary | ICD-10-CM | POA: Diagnosis not present

## 2019-11-19 DIAGNOSIS — I10 Essential (primary) hypertension: Secondary | ICD-10-CM

## 2019-11-19 DIAGNOSIS — J90 Pleural effusion, not elsewhere classified: Secondary | ICD-10-CM | POA: Diagnosis not present

## 2019-11-19 DIAGNOSIS — D709 Neutropenia, unspecified: Secondary | ICD-10-CM | POA: Diagnosis not present

## 2019-11-19 DIAGNOSIS — E785 Hyperlipidemia, unspecified: Secondary | ICD-10-CM | POA: Diagnosis not present

## 2019-11-19 DIAGNOSIS — R6520 Severe sepsis without septic shock: Secondary | ICD-10-CM | POA: Diagnosis not present

## 2019-11-19 DIAGNOSIS — Z794 Long term (current) use of insulin: Secondary | ICD-10-CM | POA: Diagnosis not present

## 2019-11-19 DIAGNOSIS — D701 Agranulocytosis secondary to cancer chemotherapy: Secondary | ICD-10-CM | POA: Diagnosis not present

## 2019-11-19 DIAGNOSIS — E871 Hypo-osmolality and hyponatremia: Secondary | ICD-10-CM | POA: Diagnosis not present

## 2019-11-19 DIAGNOSIS — L97429 Non-pressure chronic ulcer of left heel and midfoot with unspecified severity: Secondary | ICD-10-CM | POA: Diagnosis not present

## 2019-11-19 DIAGNOSIS — D61810 Antineoplastic chemotherapy induced pancytopenia: Secondary | ICD-10-CM | POA: Diagnosis not present

## 2019-11-19 DIAGNOSIS — L03116 Cellulitis of left lower limb: Secondary | ICD-10-CM | POA: Diagnosis not present

## 2019-11-19 DIAGNOSIS — R918 Other nonspecific abnormal finding of lung field: Secondary | ICD-10-CM | POA: Diagnosis not present

## 2019-11-19 DIAGNOSIS — E11649 Type 2 diabetes mellitus with hypoglycemia without coma: Secondary | ICD-10-CM | POA: Diagnosis not present

## 2019-11-19 DIAGNOSIS — T451X5A Adverse effect of antineoplastic and immunosuppressive drugs, initial encounter: Secondary | ICD-10-CM | POA: Diagnosis not present

## 2019-11-19 DIAGNOSIS — IMO0002 Reserved for concepts with insufficient information to code with codable children: Secondary | ICD-10-CM

## 2019-11-19 DIAGNOSIS — D84821 Immunodeficiency due to drugs: Secondary | ICD-10-CM | POA: Diagnosis not present

## 2019-11-19 DIAGNOSIS — D62 Acute posthemorrhagic anemia: Secondary | ICD-10-CM | POA: Diagnosis not present

## 2019-11-19 DIAGNOSIS — R5081 Fever presenting with conditions classified elsewhere: Secondary | ICD-10-CM | POA: Diagnosis not present

## 2019-11-19 DIAGNOSIS — C3431 Malignant neoplasm of lower lobe, right bronchus or lung: Secondary | ICD-10-CM | POA: Diagnosis not present

## 2019-11-19 LAB — CBC WITH DIFFERENTIAL (CANCER CENTER ONLY)
Abs Immature Granulocytes: 0 10*3/uL (ref 0.00–0.07)
Band Neutrophils: 3 %
Basophils Absolute: 0 10*3/uL (ref 0.0–0.1)
Basophils Relative: 1 %
Eosinophils Absolute: 0 10*3/uL (ref 0.0–0.5)
Eosinophils Relative: 0 %
HCT: 31.6 % — ABNORMAL LOW (ref 39.0–52.0)
Hemoglobin: 10.7 g/dL — ABNORMAL LOW (ref 13.0–17.0)
Lymphocytes Relative: 7 %
Lymphs Abs: 0.2 10*3/uL — ABNORMAL LOW (ref 0.7–4.0)
MCH: 27.6 pg (ref 26.0–34.0)
MCHC: 33.9 g/dL (ref 30.0–36.0)
MCV: 81.4 fL (ref 80.0–100.0)
Metamyelocytes Relative: 1 %
Monocytes Absolute: 0.1 10*3/uL (ref 0.1–1.0)
Monocytes Relative: 2 %
Neutro Abs: 2.9 10*3/uL (ref 1.7–7.7)
Neutrophils Relative %: 86 %
Platelet Count: 81 10*3/uL — ABNORMAL LOW (ref 150–400)
RBC: 3.88 MIL/uL — ABNORMAL LOW (ref 4.22–5.81)
RDW: 13.3 % (ref 11.5–15.5)
WBC Count: 3.3 10*3/uL — ABNORMAL LOW (ref 4.0–10.5)
nRBC: 0 % (ref 0.0–0.2)

## 2019-11-19 LAB — CMP (CANCER CENTER ONLY)
ALT: 8 U/L (ref 0–44)
AST: 6 U/L — ABNORMAL LOW (ref 15–41)
Albumin: 3 g/dL — ABNORMAL LOW (ref 3.5–5.0)
Alkaline Phosphatase: 103 U/L (ref 38–126)
Anion gap: 8 (ref 5–15)
BUN: 22 mg/dL (ref 8–23)
CO2: 27 mmol/L (ref 22–32)
Calcium: 8.9 mg/dL (ref 8.9–10.3)
Chloride: 98 mmol/L (ref 98–111)
Creatinine: 1.37 mg/dL — ABNORMAL HIGH (ref 0.61–1.24)
GFR, Est AFR Am: 58 mL/min — ABNORMAL LOW (ref >60)
GFR, Estimated: 50 mL/min — ABNORMAL LOW (ref >60)
Glucose, Bld: 426 mg/dL — ABNORMAL HIGH (ref 70–99)
Potassium: 4.5 mmol/L (ref 3.5–5.1)
Sodium: 133 mmol/L — ABNORMAL LOW (ref 135–145)
Total Bilirubin: 0.6 mg/dL (ref 0.3–1.2)
Total Protein: 6.3 g/dL — ABNORMAL LOW (ref 6.5–8.1)

## 2019-11-19 MED ORDER — INSULIN REGULAR HUMAN 100 UNIT/ML IJ SOLN
10.0000 [IU] | Freq: Once | INTRAMUSCULAR | Status: AC
  Administered 2019-11-19: 10 [IU] via SUBCUTANEOUS

## 2019-11-19 MED ORDER — LIDOCAINE-PRILOCAINE 2.5-2.5 % EX CREA: 1.0000 "application " | TOPICAL_CREAM | CUTANEOUS | 2 refills | Status: DC | PRN

## 2019-11-19 MED ORDER — INSULIN REGULAR HUMAN 100 UNIT/ML IJ SOLN
INTRAMUSCULAR | Status: AC
  Filled 2019-11-19: qty 1

## 2019-11-19 NOTE — Progress Notes (Signed)
Patient called and left message regarding [place].  Spoke with him in hallway and advised what is needed to apply. He verbalized understanding.

## 2019-11-19 NOTE — Patient Outreach (Signed)
[facility]) Care Management  11/19/2019  IRA DOUGHER Apr 12, 1946 [id number]   Call placed to member to follow up on recent ED visit.  He report experiencing some hypotension with his cancer treatments.  His antihypertensives have since been held, denies any ongoing dizziness or lightheadedness.  He is recording his blood pressure several times a day, no hypotension.  He was seen today by oncologist, continues with daily radiation treatment.  He has started on his chemotherapy treatments as well, most recent scans reportedly show improvement.  Denies any urgent concerns, encouraged to contact this care manager with questions.  Will follow up with member within the next month.  Goals Addressed              This Visit's Progress   .  Finish radiation treatment and get better (pt-stated)   On track     [place] (see longitudinal plan of care for additional care plan information)  Current Barriers:  [name] Kitchen Knowledge Deficits related to new cancer diagnosis  Nurse Case Manager Clinical Goal(s):  [name] Kitchen Over the next 28 days, patient will verbalize understanding of plan for cancer treatments . Over the next 31 days, patient will not experience hospital admission. Hospital Admissions in last 6 months = 2 . Over the next 28 days, patient will attend all scheduled medical appointments: with cancer center  Interventions:  . Inter-disciplinary care team collaboration (see longitudinal plan of care) . Evaluation of current treatment plan related to radiation therapy and patient's adherence to plan as established by provider. . Discussed plans with patient for ongoing care management follow up and provided patient with direct contact information for care management team . Reviewed scheduled/upcoming provider appointments including: cancer radiation therapy  Patient Self Care Activities:  . Attends all scheduled provider appointments . Performs ADL's independently . Calls  provider office for new concerns or questions  Initial goal documentation        [name], RN, MSN [place] [phone]

## 2019-11-20 ENCOUNTER — Encounter: Payer: Self-pay | Admitting: Internal Medicine

## 2019-11-20 ENCOUNTER — Other Ambulatory Visit: Payer: Self-pay

## 2019-11-20 ENCOUNTER — Ambulatory Visit
Admission: RE | Admit: 2019-11-20 | Discharge: 2019-11-20 | Disposition: A | Discharge status: Home / Self Care | Payer: Medicare Other | Source: Ambulatory Visit | Attending: Radiation Oncology | Admitting: Radiation Oncology

## 2019-11-20 DIAGNOSIS — C3431 Malignant neoplasm of lower lobe, right bronchus or lung: Secondary | ICD-10-CM | POA: Diagnosis not present

## 2019-11-20 NOTE — Progress Notes (Signed)
Met with patient by the elevators whom brought proof of income for [employer].  Patient approved for one-time $1000 [employer] grant to assist with personal expenses while going through treatment. Discussed in detail expenses and how they are covered. He has a copy of the approval letter as well as the expense sheet along with the Outpatient pharmacy information. He received a gas card today from his grant.  He has my card for any additional financial questions or concerns.

## 2019-11-21 ENCOUNTER — Other Ambulatory Visit: Payer: Self-pay

## 2019-11-21 ENCOUNTER — Ambulatory Visit
Admission: RE | Admit: 2019-11-21 | Discharge: 2019-11-21 | Disposition: A | Discharge status: Home / Self Care | Payer: Medicare Other | Source: Ambulatory Visit | Attending: Radiation Oncology | Admitting: Radiation Oncology

## 2019-11-21 DIAGNOSIS — C3431 Malignant neoplasm of lower lobe, right bronchus or lung: Secondary | ICD-10-CM | POA: Diagnosis not present

## 2019-11-22 ENCOUNTER — Emergency Department (HOSPITAL_COMMUNITY): Payer: Medicare Other

## 2019-11-22 ENCOUNTER — Ambulatory Visit
Admission: RE | Admit: 2019-11-22 | Discharge: 2019-11-22 | Disposition: A | Discharge status: Home / Self Care | Payer: Medicare Other | Source: Ambulatory Visit | Attending: Radiation Oncology | Admitting: Radiation Oncology

## 2019-11-22 ENCOUNTER — Encounter (HOSPITAL_COMMUNITY): Payer: Self-pay | Admitting: Emergency Medicine

## 2019-11-22 ENCOUNTER — Inpatient Hospital Stay (HOSPITAL_COMMUNITY)
Admission: EM | Admit: 2019-11-22 | Discharge: 2019-11-28 | DRG: 871 | Disposition: A | Discharge status: Home Health | Payer: Medicare Other | Attending: Internal Medicine | Admitting: Internal Medicine

## 2019-11-22 ENCOUNTER — Other Ambulatory Visit: Payer: Self-pay

## 2019-11-22 DIAGNOSIS — E872 Acidosis: Secondary | ICD-10-CM | POA: Diagnosis present

## 2019-11-22 DIAGNOSIS — D61810 Antineoplastic chemotherapy induced pancytopenia: Secondary | ICD-10-CM | POA: Diagnosis not present

## 2019-11-22 DIAGNOSIS — R000 Tachycardia, unspecified: Secondary | ICD-10-CM

## 2019-11-22 DIAGNOSIS — Z923 Personal history of irradiation: Secondary | ICD-10-CM

## 2019-11-22 DIAGNOSIS — E781 Pure hyperglyceridemia: Secondary | ICD-10-CM | POA: Diagnosis present

## 2019-11-22 DIAGNOSIS — Z87891 Personal history of nicotine dependence: Secondary | ICD-10-CM

## 2019-11-22 DIAGNOSIS — T451X5A Adverse effect of antineoplastic and immunosuppressive drugs, initial encounter: Secondary | ICD-10-CM | POA: Diagnosis present

## 2019-11-22 DIAGNOSIS — E11621 Type 2 diabetes mellitus with foot ulcer: Secondary | ICD-10-CM | POA: Diagnosis present

## 2019-11-22 DIAGNOSIS — D701 Agranulocytosis secondary to cancer chemotherapy: Secondary | ICD-10-CM | POA: Diagnosis present

## 2019-11-22 DIAGNOSIS — IMO0002 Reserved for concepts with insufficient information to code with codable children: Secondary | ICD-10-CM | POA: Diagnosis present

## 2019-11-22 DIAGNOSIS — D72819 Decreased white blood cell count, unspecified: Secondary | ICD-10-CM | POA: Diagnosis not present

## 2019-11-22 DIAGNOSIS — D62 Acute posthemorrhagic anemia: Secondary | ICD-10-CM | POA: Diagnosis not present

## 2019-11-22 DIAGNOSIS — Z20822 Contact with and (suspected) exposure to covid-19: Secondary | ICD-10-CM | POA: Diagnosis present

## 2019-11-22 DIAGNOSIS — N1832 Chronic kidney disease, stage 3b: Secondary | ICD-10-CM | POA: Diagnosis present

## 2019-11-22 DIAGNOSIS — Z8719 Personal history of other diseases of the digestive system: Secondary | ICD-10-CM

## 2019-11-22 DIAGNOSIS — L02612 Cutaneous abscess of left foot: Secondary | ICD-10-CM | POA: Diagnosis present

## 2019-11-22 DIAGNOSIS — L97429 Non-pressure chronic ulcer of left heel and midfoot with unspecified severity: Secondary | ICD-10-CM | POA: Diagnosis present

## 2019-11-22 DIAGNOSIS — E785 Hyperlipidemia, unspecified: Secondary | ICD-10-CM | POA: Diagnosis present

## 2019-11-22 DIAGNOSIS — Z794 Long term (current) use of insulin: Secondary | ICD-10-CM | POA: Diagnosis not present

## 2019-11-22 DIAGNOSIS — L0390 Cellulitis, unspecified: Secondary | ICD-10-CM

## 2019-11-22 DIAGNOSIS — I129 Hypertensive chronic kidney disease with stage 1 through stage 4 chronic kidney disease, or unspecified chronic kidney disease: Secondary | ICD-10-CM | POA: Diagnosis not present

## 2019-11-22 DIAGNOSIS — A419 Sepsis, unspecified organism: Secondary | ICD-10-CM | POA: Diagnosis not present

## 2019-11-22 DIAGNOSIS — Z79899 Other long term (current) drug therapy: Secondary | ICD-10-CM

## 2019-11-22 DIAGNOSIS — C3431 Malignant neoplasm of lower lobe, right bronchus or lung: Secondary | ICD-10-CM | POA: Diagnosis not present

## 2019-11-22 DIAGNOSIS — R5081 Fever presenting with conditions classified elsewhere: Secondary | ICD-10-CM | POA: Diagnosis present

## 2019-11-22 DIAGNOSIS — E1122 Type 2 diabetes mellitus with diabetic chronic kidney disease: Secondary | ICD-10-CM | POA: Diagnosis not present

## 2019-11-22 DIAGNOSIS — D84821 Immunodeficiency due to drugs: Secondary | ICD-10-CM | POA: Diagnosis present

## 2019-11-22 DIAGNOSIS — E1165 Type 2 diabetes mellitus with hyperglycemia: Secondary | ICD-10-CM | POA: Diagnosis present

## 2019-11-22 DIAGNOSIS — Z8249 Family history of ischemic heart disease and other diseases of the circulatory system: Secondary | ICD-10-CM

## 2019-11-22 DIAGNOSIS — N183 Chronic kidney disease, stage 3 unspecified: Secondary | ICD-10-CM | POA: Diagnosis present

## 2019-11-22 DIAGNOSIS — E861 Hypovolemia: Secondary | ICD-10-CM | POA: Diagnosis present

## 2019-11-22 DIAGNOSIS — F0390 Unspecified dementia without behavioral disturbance: Secondary | ICD-10-CM | POA: Diagnosis present

## 2019-11-22 DIAGNOSIS — E1142 Type 2 diabetes mellitus with diabetic polyneuropathy: Secondary | ICD-10-CM | POA: Diagnosis present

## 2019-11-22 DIAGNOSIS — C3490 Malignant neoplasm of unspecified part of unspecified bronchus or lung: Secondary | ICD-10-CM | POA: Diagnosis not present

## 2019-11-22 DIAGNOSIS — L03116 Cellulitis of left lower limb: Secondary | ICD-10-CM | POA: Diagnosis not present

## 2019-11-22 DIAGNOSIS — Z820 Family history of epilepsy and other diseases of the nervous system: Secondary | ICD-10-CM

## 2019-11-22 DIAGNOSIS — Z833 Family history of diabetes mellitus: Secondary | ICD-10-CM

## 2019-11-22 DIAGNOSIS — K219 Gastro-esophageal reflux disease without esophagitis: Secondary | ICD-10-CM | POA: Diagnosis present

## 2019-11-22 DIAGNOSIS — E11649 Type 2 diabetes mellitus with hypoglycemia without coma: Secondary | ICD-10-CM | POA: Diagnosis not present

## 2019-11-22 DIAGNOSIS — D709 Neutropenia, unspecified: Secondary | ICD-10-CM | POA: Diagnosis not present

## 2019-11-22 DIAGNOSIS — J189 Pneumonia, unspecified organism: Secondary | ICD-10-CM

## 2019-11-22 DIAGNOSIS — I10 Essential (primary) hypertension: Secondary | ICD-10-CM | POA: Diagnosis present

## 2019-11-22 DIAGNOSIS — R918 Other nonspecific abnormal finding of lung field: Secondary | ICD-10-CM | POA: Diagnosis not present

## 2019-11-22 DIAGNOSIS — R509 Fever, unspecified: Secondary | ICD-10-CM | POA: Diagnosis not present

## 2019-11-22 DIAGNOSIS — R6520 Severe sepsis without septic shock: Secondary | ICD-10-CM | POA: Diagnosis present

## 2019-11-22 DIAGNOSIS — Z9049 Acquired absence of other specified parts of digestive tract: Secondary | ICD-10-CM

## 2019-11-22 DIAGNOSIS — G4733 Obstructive sleep apnea (adult) (pediatric): Secondary | ICD-10-CM | POA: Diagnosis not present

## 2019-11-22 DIAGNOSIS — E871 Hypo-osmolality and hyponatremia: Secondary | ICD-10-CM | POA: Diagnosis present

## 2019-11-22 DIAGNOSIS — Z823 Family history of stroke: Secondary | ICD-10-CM

## 2019-11-22 DIAGNOSIS — N1831 Chronic kidney disease, stage 3a: Secondary | ICD-10-CM | POA: Diagnosis not present

## 2019-11-22 DIAGNOSIS — L97529 Non-pressure chronic ulcer of other part of left foot with unspecified severity: Secondary | ICD-10-CM | POA: Diagnosis not present

## 2019-11-22 DIAGNOSIS — E876 Hypokalemia: Secondary | ICD-10-CM | POA: Diagnosis present

## 2019-11-22 DIAGNOSIS — S91302D Unspecified open wound, left foot, subsequent encounter: Secondary | ICD-10-CM | POA: Diagnosis not present

## 2019-11-22 DIAGNOSIS — Z825 Family history of asthma and other chronic lower respiratory diseases: Secondary | ICD-10-CM

## 2019-11-22 DIAGNOSIS — C3491 Malignant neoplasm of unspecified part of right bronchus or lung: Secondary | ICD-10-CM | POA: Diagnosis present

## 2019-11-22 DIAGNOSIS — Z801 Family history of malignant neoplasm of trachea, bronchus and lung: Secondary | ICD-10-CM

## 2019-11-22 DIAGNOSIS — Z96641 Presence of right artificial hip joint: Secondary | ICD-10-CM | POA: Diagnosis present

## 2019-11-22 DIAGNOSIS — N179 Acute kidney failure, unspecified: Secondary | ICD-10-CM | POA: Diagnosis not present

## 2019-11-22 DIAGNOSIS — J4520 Mild intermittent asthma, uncomplicated: Secondary | ICD-10-CM | POA: Diagnosis present

## 2019-11-22 DIAGNOSIS — J90 Pleural effusion, not elsewhere classified: Secondary | ICD-10-CM | POA: Diagnosis not present

## 2019-11-22 DIAGNOSIS — M19072 Primary osteoarthritis, left ankle and foot: Secondary | ICD-10-CM | POA: Diagnosis not present

## 2019-11-22 DIAGNOSIS — F329 Major depressive disorder, single episode, unspecified: Secondary | ICD-10-CM | POA: Diagnosis present

## 2019-11-22 LAB — COMPREHENSIVE METABOLIC PANEL
ALT: 15 U/L (ref 0–44)
AST: 12 U/L — ABNORMAL LOW (ref 15–41)
Albumin: 3.4 g/dL — ABNORMAL LOW (ref 3.5–5.0)
Alkaline Phosphatase: 83 U/L (ref 38–126)
Anion gap: 12 (ref 5–15)
BUN: 24 mg/dL — ABNORMAL HIGH (ref 8–23)
CO2: 26 mmol/L (ref 22–32)
Calcium: 8.8 mg/dL — ABNORMAL LOW (ref 8.9–10.3)
Chloride: 92 mmol/L — ABNORMAL LOW (ref 98–111)
Creatinine, Ser: 1.78 mg/dL — ABNORMAL HIGH (ref 0.61–1.24)
GFR calc Af Amer: 43 mL/min — ABNORMAL LOW (ref >60)
GFR calc non Af Amer: 37 mL/min — ABNORMAL LOW (ref >60)
Glucose, Bld: 219 mg/dL — ABNORMAL HIGH (ref 70–99)
Potassium: 3.6 mmol/L (ref 3.5–5.1)
Sodium: 130 mmol/L — ABNORMAL LOW (ref 135–145)
Total Bilirubin: 0.8 mg/dL (ref 0.3–1.2)
Total Protein: 7.5 g/dL (ref 6.5–8.1)

## 2019-11-22 LAB — CBG MONITORING, ED: Glucose-Capillary: 236 mg/dL — ABNORMAL HIGH (ref 70–99)

## 2019-11-22 LAB — CBC WITH DIFFERENTIAL/PLATELET
Abs Immature Granulocytes: 0 10*3/uL (ref 0.00–0.07)
Basophils Absolute: 0 10*3/uL (ref 0.0–0.1)
Basophils Relative: 2 %
Eosinophils Absolute: 0 10*3/uL (ref 0.0–0.5)
Eosinophils Relative: 0 %
HCT: 28.2 % — ABNORMAL LOW (ref 39.0–52.0)
Hemoglobin: 9.8 g/dL — ABNORMAL LOW (ref 13.0–17.0)
Immature Granulocytes: 0 %
Lymphocytes Relative: 13 %
Lymphs Abs: 0.1 10*3/uL — ABNORMAL LOW (ref 0.7–4.0)
MCH: 27.8 pg (ref 26.0–34.0)
MCHC: 34.8 g/dL (ref 30.0–36.0)
MCV: 79.9 fL — ABNORMAL LOW (ref 80.0–100.0)
Monocytes Absolute: 0.2 10*3/uL (ref 0.1–1.0)
Monocytes Relative: 22 %
Neutro Abs: 0.6 10*3/uL — ABNORMAL LOW (ref 1.7–7.7)
Neutrophils Relative %: 63 %
Platelets: 54 10*3/uL — ABNORMAL LOW (ref 150–400)
RBC: 3.53 MIL/uL — ABNORMAL LOW (ref 4.22–5.81)
RDW: 13.1 % (ref 11.5–15.5)
WBC: 0.9 10*3/uL — CL (ref 4.0–10.5)
nRBC: 0 % (ref 0.0–0.2)

## 2019-11-22 LAB — APTT: aPTT: 34 seconds (ref 24–36)

## 2019-11-22 LAB — LACTIC ACID, PLASMA: Lactic Acid, Venous: 3 mmol/L (ref 0.5–1.9)

## 2019-11-22 LAB — PROTIME-INR
INR: 1.1 (ref 0.8–1.2)
Prothrombin Time: 13.6 seconds (ref 11.4–15.2)

## 2019-11-22 LAB — SARS CORONAVIRUS 2 BY RT PCR (HOSPITAL ORDER, PERFORMED IN ~~LOC~~ HOSPITAL LAB): SARS Coronavirus 2: NEGATIVE

## 2019-11-22 MED ORDER — ACETAMINOPHEN 325 MG PO TABS
650.0000 mg | ORAL_TABLET | Freq: Three times a day (TID) | ORAL | Status: DC | PRN
  Administered 2019-11-24 – 2019-11-27 (×6): 650 mg via ORAL
  Filled 2019-11-22 (×6): qty 2

## 2019-11-22 MED ORDER — SODIUM CHLORIDE 0.9 % IV SOLN: 2.0000 g | Freq: Once | INTRAVENOUS | Status: DC

## 2019-11-22 MED ORDER — MEMANTINE HCL 10 MG PO TABS
10.0000 mg | ORAL_TABLET | Freq: Two times a day (BID) | ORAL | Status: DC
  Administered 2019-11-23 – 2019-11-28 (×12): 10 mg via ORAL
  Filled 2019-11-22 (×11): qty 1
  Filled 2019-11-22: qty 2

## 2019-11-22 MED ORDER — ONDANSETRON HCL 4 MG PO TABS: 4.0000 mg | ORAL_TABLET | Freq: Four times a day (QID) | ORAL | Status: DC | PRN

## 2019-11-22 MED ORDER — MONTELUKAST SODIUM 10 MG PO TABS
10.0000 mg | ORAL_TABLET | Freq: Every day | ORAL | Status: DC
  Administered 2019-11-23 – 2019-11-27 (×6): 10 mg via ORAL
  Filled 2019-11-22 (×7): qty 1

## 2019-11-22 MED ORDER — SIMVASTATIN 20 MG PO TABS: 40.0000 mg | ORAL_TABLET | Freq: Every evening | ORAL | Status: DC

## 2019-11-22 MED ORDER — METRONIDAZOLE IN NACL 5-0.79 MG/ML-% IV SOLN
500.0000 mg | Freq: Three times a day (TID) | INTRAVENOUS | Status: DC
  Administered 2019-11-23 (×2): 500 mg via INTRAVENOUS
  Filled 2019-11-22 (×2): qty 100

## 2019-11-22 MED ORDER — SODIUM CHLORIDE 0.9 % IV SOLN: INTRAVENOUS | Status: DC

## 2019-11-22 MED ORDER — INSULIN ASPART 100 UNIT/ML ~~LOC~~ SOLN
0.0000 [IU] | Freq: Three times a day (TID) | SUBCUTANEOUS | Status: DC
  Administered 2019-11-23: 3 [IU] via SUBCUTANEOUS
  Administered 2019-11-23: 5 [IU] via SUBCUTANEOUS
  Administered 2019-11-23: 8 [IU] via SUBCUTANEOUS
  Filled 2019-11-22: qty 0.15

## 2019-11-22 MED ORDER — PANTOPRAZOLE SODIUM 40 MG PO TBEC
40.0000 mg | DELAYED_RELEASE_TABLET | Freq: Every day | ORAL | Status: DC
  Administered 2019-11-23 – 2019-11-28 (×6): 40 mg via ORAL
  Filled 2019-11-22 (×7): qty 1

## 2019-11-22 MED ORDER — DONEPEZIL HCL 10 MG PO TABS
10.0000 mg | ORAL_TABLET | Freq: Every day | ORAL | Status: DC
  Administered 2019-11-23 – 2019-11-27 (×6): 10 mg via ORAL
  Filled 2019-11-22 (×5): qty 1
  Filled 2019-11-22: qty 2

## 2019-11-22 MED ORDER — INSULIN ASPART 100 UNIT/ML ~~LOC~~ SOLN
0.0000 [IU] | Freq: Every day | SUBCUTANEOUS | Status: DC
  Administered 2019-11-23: 3 [IU] via SUBCUTANEOUS
  Administered 2019-11-24: 2 [IU] via SUBCUTANEOUS
  Filled 2019-11-22: qty 0.05

## 2019-11-22 MED ORDER — SUCRALFATE 1 G PO TABS
1.0000 g | ORAL_TABLET | Freq: Four times a day (QID) | ORAL | Status: DC
  Administered 2019-11-23 – 2019-11-28 (×21): 1 g via ORAL
  Filled 2019-11-22 (×22): qty 1

## 2019-11-22 MED ORDER — VANCOMYCIN HCL IN DEXTROSE 1-5 GM/200ML-% IV SOLN: 1000.0000 mg | Freq: Once | INTRAVENOUS | Status: DC

## 2019-11-22 MED ORDER — VENLAFAXINE HCL ER 75 MG PO CP24
75.0000 mg | ORAL_CAPSULE | Freq: Two times a day (BID) | ORAL | Status: DC
  Administered 2019-11-23 – 2019-11-28 (×12): 75 mg via ORAL
  Filled 2019-11-22 (×12): qty 1

## 2019-11-22 MED ORDER — LACTATED RINGERS IV BOLUS (SEPSIS)
1000.0000 mL | Freq: Once | INTRAVENOUS | Status: AC
  Administered 2019-11-22: 1000 mL via INTRAVENOUS

## 2019-11-22 MED ORDER — PIPERACILLIN-TAZOBACTAM 3.375 G IVPB 30 MIN
3.3750 g | Freq: Once | INTRAVENOUS | Status: AC
  Administered 2019-11-22: 3.375 g via INTRAVENOUS
  Filled 2019-11-22: qty 50

## 2019-11-22 MED ORDER — VANCOMYCIN HCL 2000 MG/400ML IV SOLN
2000.0000 mg | Freq: Once | INTRAVENOUS | Status: AC
  Administered 2019-11-22: 2000 mg via INTRAVENOUS
  Filled 2019-11-22: qty 400

## 2019-11-22 MED ORDER — SIMVASTATIN 40 MG PO TABS
40.0000 mg | ORAL_TABLET | Freq: Every day | ORAL | Status: DC
  Administered 2019-11-23 – 2019-11-27 (×5): 40 mg via ORAL
  Filled 2019-11-22 (×5): qty 1
  Filled 2019-11-22: qty 2

## 2019-11-22 MED ORDER — ONDANSETRON HCL 4 MG/2ML IJ SOLN: 4.0000 mg | Freq: Four times a day (QID) | INTRAMUSCULAR | Status: DC | PRN

## 2019-11-22 NOTE — H&P (Signed)
History and Physical    MALIC ROSTEN HBZ:[id number] DOB: 02/15/1946 DOA: 11/22/2019  PCP: [name], MD  Patient coming from: Home  I have personally briefly reviewed patient's old medical records in [place]  Chief Complaint: Fever and chills  HPI: [name] is a 74 y.o. male with medical history significant of small cell lung cancer, hypertension, CKD stage IIIa, type 2 diabetes mellitus, hyperlipidemia and chronic left foot ulcer presented to ED with a complaint of fever and chills.  According to patient, he was in his usual state of health until 4 PM when he suddenly started having fever and chills.  He came to the ED to seek medical attention.  Does not have any other complaint however he states that he feels that the source of infection is likely his left foot ulcer.  He further tells me that the foot ulcer has been ongoing for about 5 months for which he sees wound care as outpatient in [place] and the last time he was seen was about a month ago.  He believes that there was some drainage coming out of the left foot since last past few days and he has noticed some redness around it.  Denies having any other complaints such as chest pain, shortness of breath, nausea, vomiting, any problem with urination or with bowel movement.  He is currently getting chemotherapy for his lung cancer.  ED Course: Upon arrival to ED, he was febrile with 1-1.8, tachycardic and tachypneic.  BMP showed AKI on CKD stage IIIa.  CBC showed leukopenia with white cells of 0.9.  Microcytic anemia with stable hemoglobin.  Lactic acid 3.0.  Foot x-ray does not show any acute abnormality.  Chest x-ray is normal.  COVID-19 pending.  He is fully vaccinated against COVID-19.  Hospital service were consulted to admit the patient for severe sepsis of unknown source.  Review of Systems: As per HPI otherwise negative.    Past Medical History:  Diagnosis Date  . Allergic rhinitis due to pollen   .  Cancer ([place])   . Chronic kidney disease, stage III (moderate) 12/19/2012  . Depression   . Diabetes mellitus ([place])   . Diverticulosis   . Erectile dysfunction associated with type 2 diabetes mellitus ([place]) 05/28/2015  . Former very heavy cigarette smoker (more than 40 per day) 10/07/2014  . GERD (gastroesophageal reflux disease)   . Gout   . Hyperlipidemia   . Hypertension   . Hypogonadism male 06/14/2012  . Iron deficiency   . Memory loss   . Osteoarthritis   . Personal history of colonic polyps    1996    Past Surgical History:  Procedure Laterality Date  . BRONCHIAL NEEDLE ASPIRATION BIOPSY  10/17/2019   Procedure: BRONCHIAL NEEDLE ASPIRATION BIOPSIES;  Surgeon: Juanito Doom, MD;  Location: [facility];  Service: Cardiopulmonary;;  . CARDIAC CATHETERIZATION  11/2011   [facility]  . CHOLECYSTECTOMY  1980  . ENDOBRONCHIAL ULTRASOUND Bilateral 10/17/2019   Procedure: ENDOBRONCHIAL ULTRASOUND;  Surgeon: Juanito Doom, MD;  Location: [facility];  Service: Cardiopulmonary;  Laterality: Bilateral;  . ESOPHAGOGASTRODUODENOSCOPY (EGD) WITH PROPOFOL N/A 09/27/2017   Procedure: ESOPHAGOGASTRODUODENOSCOPY (EGD) WITH PROPOFOL;  Surgeon: Lin Landsman, MD;  Location: [facility];  Service: Gastroenterology;  Laterality: N/A;  . EYE SURGERY Right 07/29/2016   [facility] - Dr. Manuella Ghazi  . EYE SURGERY Left 08/23/2106   cataract - Dr. Manuella Ghazi  . HEMOSTASIS CONTROL  10/17/2019   Procedure: HEMOSTASIS CONTROL;  Surgeon: Lake Bells,  Ronie Spies, MD;  Location: [facility];  Service: Cardiopulmonary;;  . TOTAL HIP ARTHROPLASTY  2009   Dr. Gladstone Lighter  . TOTAL HIP ARTHROPLASTY       reports that he quit smoking about 19 years ago. His smoking use included cigarettes. He has a 105.00 pack-year smoking history. He has never used smokeless tobacco. He reports that he does not drink alcohol and does not use drugs.  Allergies  Allergen Reactions  . Tetracycline Itching and Rash    Family History  Problem  Relation Age of Onset  . Diabetes Mother   . Alzheimer's disease Mother   . Diabetes Father   . Lung cancer Father        Age 52  . Stroke Father   . Heart attack Father   . Lung cancer Sister        lung  . Heart disease Maternal Grandfather   . COPD Sister   . Heart attack Sister   . Heart disease Sister     Prior to Admission medications   Medication Sig Start Date End Date Taking? Authorizing Provider  acetaminophen (QC ACETAMINOPHEN 8HR ARTH PAIN) 650 MG CR tablet Take 650 mg by mouth every 8 (eight) hours as needed for pain.   Yes [provider]  ampicillin (PRINCIPEN) 500 MG capsule Take 500 mg by mouth daily as needed (flare up).  10/24/19  Yes [provider]  Ascorbic Acid (VITAMIN C) 1000 MG tablet Take 1,000 mg by mouth daily.   Yes [provider]  cholecalciferol (VITAMIN D) 1000 UNITS tablet Take 1,000 Units by mouth daily.     Yes [provider]  donepezil (ARICEPT) 10 MG tablet TAKE 1 TABLET BY MOUTH AT BEDTIME Patient taking differently: Take 10 mg by mouth at bedtime.  08/24/19  Yes Copland, Frederico Hamman, MD  doxazosin (CARDURA) 8 MG tablet TAKE ONE-HALF TABLET AT BEDTIME Patient taking differently: Take 4 mg by mouth every evening.  01/26/19  Yes Copland, Frederico Hamman, MD  fluticasone (FLONASE) 50 MCG/ACT nasal spray PLACE 2 SPRAYS IN EACH NOSTRIL DAILY Patient taking differently: Place 2 sprays into both nostrils daily as needed for allergies or rhinitis.  01/26/19  Yes Copland, Frederico Hamman, MD  guaiFENesin-dextromethorphan (ROBITUSSIN DM) 100-10 MG/5ML syrup Take 5 mLs by mouth every 6 (six) hours as needed for cough. 10/14/19  Yes Arrien, Jimmy Picket, MD  Hyprom-Naphaz-Polysorb-Zn Sulf (CLEAR EYES COMPLETE OP) Place 1 drop into both eyes daily as needed (dry eyes).   Yes [provider]  insulin regular human CONCENTRATED (HUMULIN R U-500 KWIKPEN) 500 UNIT/ML kwikpen Inject under skin 80-90 units before b'fast, 60 units before  lunch, 80-90 units vefore dinner Patient taking differently: Inject 20-40 Units into the skin See admin instructions. Takes 35-40 units before breakfast, 20 units at lunch and 35-40 units at night 09/17/19  Yes Philemon Kingdom, MD  lidocaine-prilocaine (EMLA) cream Apply 1 application topically as needed. Patient taking differently: Apply 1 application topically as needed (port access).  11/19/19  Yes Heilingoetter, Cassandra L, [name]  memantine (NAMENDA) 10 MG tablet TAKE 1 TABLET BY MOUTH TWICE A DAY Patient taking differently: Take 10 mg by mouth 2 (two) times daily.  09/20/19  Yes Copland, Frederico Hamman, MD  montelukast (SINGULAIR) 10 MG tablet Take 1 tablet (10 mg total) by mouth at bedtime. 09/20/19  Yes Bedsole, Amy E, MD  omeprazole (PRILOSEC) 40 MG capsule Take 1 capsule (40 mg total) by mouth 2 (two) times daily before a meal. Patient taking differently:  Take 40 mg by mouth daily.  08/21/18 04/06/28 Yes Copland, Frederico Hamman, MD  prochlorperazine (COMPAZINE) 10 MG tablet Take 1 tablet (10 mg total) by mouth every 6 (six) hours as needed. Patient taking differently: Take 10 mg by mouth every 6 (six) hours as needed for nausea.  10/23/19  Yes Heilingoetter, Cassandra L, [name]  simvastatin (ZOCOR) 40 MG tablet TAKE 1 TABLET BY MOUTH AT BEDTIME Patient taking differently: Take 40 mg by mouth every evening.  04/30/19  Yes Copland, Frederico Hamman, MD  sucralfate (CARAFATE) 1 g tablet Take 1 tablet (1 g total) by mouth 4 (four) times daily. Dissolve each tablet in 15 cc water before use. 11/02/19  Yes Kyung Rudd, MD  venlafaxine XR (EFFEXOR-XR) 75 MG 24 hr capsule TAKE 1 CAPSULE BY MOUTH EVERY MORNING AND 2 CAPSULES AT BEDTIME Patient taking differently: Take 75 mg by mouth in the morning and at bedtime.  01/27/19  Yes Copland, Frederico Hamman, MD  Insulin Pen Needle (PEN NEEDLES) 31G X 5 MM MISC 1 each by Does not apply route 3 (three) times daily. To inject insulin E11.42 09/20/19   Philemon Kingdom, MD    Physical  Exam: Vitals:   11/22/19 2038 11/22/19 2157 11/22/19 2230  BP: 98/66 116/78 (!) 102/56  Pulse: (!) 146 (!) 143 (!) 136  Resp: (!) 23 (!) 22 (!) 23  Temp: (!) 101.8 F (38.8 C)    TempSrc: Oral    SpO2: 98% 95% 100%  Weight: 95.7 kg    Height: 5\' 11"  (1.803 m)      Constitutional: NAD, calm, comfortable Vitals:   11/22/19 2038 11/22/19 2157 11/22/19 2230  BP: 98/66 116/78 (!) 102/56  Pulse: (!) 146 (!) 143 (!) 136  Resp: (!) 23 (!) 22 (!) 23  Temp: (!) 101.8 F (38.8 C)    TempSrc: Oral    SpO2: 98% 95% 100%  Weight: 95.7 kg    Height: 5\' 11"  (1.803 m)     Eyes: PERRL, lids and conjunctivae normal ENMT: Mucous membranes are moist. Posterior pharynx clear of any exudate or lesions.Normal dentition.  Neck: normal, supple, no masses, no thyromegaly Respiratory: clear to auscultation bilaterally, no wheezing, no crackles. Normal respiratory effort. No accessory muscle use.  Cardiovascular: Regular rate and rhythm, no murmurs / rubs / gallops. No extremity edema. 2+ pedal pulses. No carotid bruits.  Abdomen: no tenderness, no masses palpated. No hepatosplenomegaly. Bowel sounds positive.  Musculoskeletal: no clubbing / cyanosis. No joint deformity upper and lower extremities. Good ROM, no contractures. Normal muscle tone.  Skin: Necrotic ulcer on the left side of the left heel with minimal erythema around it.  It is nontender.  No drainage or fluctuance at this point in time.  It is mildly tender.  Picture as below. Neurologic: CN 2-12 grossly intact. Sensation intact, DTR normal. Strength 5/5 in all 4.  Psychiatric: Normal judgment and insight. Alert and oriented x 3. Normal mood.        Labs on Admission: I have personally reviewed following labs and imaging studies  CBC: Recent Labs  Lab 11/19/19 1031 11/22/19 2115  WBC 3.3* 0.9*  NEUTROABS 2.9 0.6*  HGB 10.7* 9.8*  HCT 31.6* 28.2*  MCV 81.4 79.9*  PLT 81* 54*   Basic Metabolic Panel: Recent Labs  Lab  11/19/19 1031 11/22/19 2115  NA 133* 130*  K 4.5 3.6  CL 98 92*  CO2 27 26  GLUCOSE 426* 219*  BUN 22 24*  CREATININE 1.37* 1.78*  CALCIUM 8.9 8.8*  GFR: Estimated Creatinine Clearance: 43 mL/min (A) (by C-G formula based on SCr of 1.78 mg/dL (H)). Liver Function Tests: Recent Labs  Lab 11/19/19 1031 11/22/19 2115  AST 6* 12*  ALT 8 15  ALKPHOS 103 83  BILITOT 0.6 0.8  PROT 6.3* 7.5  ALBUMIN 3.0* 3.4*   No results for input(s): LIPASE, AMYLASE in the last 168 hours. No results for input(s): AMMONIA in the last 168 hours. Coagulation Profile: Recent Labs  Lab 11/22/19 2115  INR 1.1   Cardiac Enzymes: No results for input(s): CKTOTAL, CKMB, CKMBINDEX, TROPONINI in the last 168 hours. BNP (last 3 results) No results for input(s): PROBNP in the last 8760 hours. HbA1C: No results for input(s): HGBA1C in the last 72 hours. CBG: Recent Labs  Lab 11/22/19 2106  GLUCAP 236*   Lipid Profile: No results for input(s): CHOL, HDL, LDLCALC, TRIG, CHOLHDL, LDLDIRECT in the last 72 hours. Thyroid Function Tests: No results for input(s): TSH, T4TOTAL, FREET4, T3FREE, THYROIDAB in the last 72 hours. Anemia Panel: No results for input(s): VITAMINB12, FOLATE, FERRITIN, TIBC, IRON, RETICCTPCT in the last 72 hours. Urine analysis:    Component Value Date/Time   COLORURINE YELLOW 11/15/2019 1636   APPEARANCEUR CLEAR 11/15/2019 1636   LABSPEC 1.017 11/15/2019 1636   PHURINE 5.0 11/15/2019 1636   GLUCOSEU >=500 (A) 11/15/2019 1636   HGBUR SMALL (A) 11/15/2019 1636   BILIRUBINUR NEGATIVE 11/15/2019 1636   BILIRUBINUR 1+ 09/10/2014 1615   KETONESUR NEGATIVE 11/15/2019 1636   PROTEINUR >=300 (A) 11/15/2019 1636   UROBILINOGEN 0.2 12/05/2014 2146   NITRITE NEGATIVE 11/15/2019 1636   LEUKOCYTESUR NEGATIVE 11/15/2019 1636    Radiological Exams on Admission: DG Chest 2 View  Result Date: 11/22/2019 CLINICAL DATA:  Suspected sepsis EXAM: CHEST - 2 VIEW COMPARISON:  10/12/2019,  CT 11/15/2019 FINDINGS: No focal consolidation or pleural effusion. Normal heart size. Decreased right hilar mass. No pneumothorax. IMPRESSION: No acute airspace disease. Interval decrease in size of right hilar mass Electronically Signed   By: Donavan Foil M.D.   On: 11/22/2019 21:07   DG Foot Complete Left  Result Date: 11/22/2019 CLINICAL DATA:  Foot pain EXAM: LEFT FOOT - COMPLETE 3+ VIEW COMPARISON:  02/12/2019 FINDINGS: No acute displaced fracture or malalignment. Soft tissue ulceration at the mid sole of the foot. No radiopaque foreign body. Small plantar calcaneal spur. No osseous destructive change. Mild degenerative change at the first MTP joint. IMPRESSION: No acute osseous abnormality. Soft tissue ulceration at the mid sole of the foot. Electronically Signed   By: Donavan Foil M.D.   On: 11/22/2019 21:38    Assessment/Plan Active Problems:   Hypertriglyceridemia   Essential hypertension   Chronic kidney disease, stage III (moderate)   Uncontrolled type 2 diabetes mellitus with peripheral neuropathy (HCC)   Small cell lung cancer (HCC)   Severe sepsis (HCC)   Neutropenic fever (HCC)   Acute renal failure superimposed on stage 3a chronic kidney disease (HCC)    Severe sepsis likely secondary to diabetic left foot ulcer/presumed osteomyelitis/neutropenic fever, POA: Patient presents with septic features including tachycardia, fever of 101, tachypnea as well as leukopenia.  He also meets criteria for neutropenic fever.  Source of infection is presumed to be left foot diabetic ulcer.  This could very well be osteomyelitis.  No other source identified.  Chest x-ray negative.  COVID-19 pending however he is fully vaccinated against COVID-19.  UA ordered by ED, still pending collection of the sample however he has no urinary symptoms  so I do not suspect UTI.  X-ray foot negative for any osteomyelitis.  We will proceed with MRI left foot to rule out osteomyelitis.  Patient has received fluid  bolus according to sepsis protocol.  We will continue him on normal saline at 150 cc/h.  Patient's blood pressure borderline.  Patient clinically much better and he is completely alert and oriented.  We will start him on cefepime, Flagyl and vancomycin.  Blood culture ordered.  Small cell lung cancer: Currently getting chemotherapy.  His primary oncologist is Dr. [name].  Will need to reach out to him if his consultation is required.  AKI on CKD stage IIIa: Likely secondary to sepsis/prerenal.  Continue hydration.  History of hypertension: Due to borderline blood pressure, hold antihypertensive medications which includes doxazosin.  Type 2 diabetes mellitus: Patient takes NovoLog sliding scale at home.  Will check hemoglobin A1c.  Start on SSI for now.  Hyperlipidemia: Resume Zocor.  Mild intermittent asthma: Resume Singulair.  Dementia: Resume home medications.  Patient is completely alert and oriented.  Pancytopenia: Likely chemotherapy-induced.  Prior to 11/15/2019, patient had anemia however starting last week, he also has pancytopenia.  His platelets are worse at 54 today.  White cell 0.9.  Avoid heparin products.  No signs of bleeding.  Will need to touch base with oncology if his numbers get worse tomorrow.  DVT prophylaxis: SCDs Start: 11/22/19 2243 Code Status: Full code Family Communication: None present at bedside.  Plan of care discussed with patient in length and he verbalized understanding and agreed with it. Disposition Plan: Discharge in next couple of days when medically stable. Consults called: None Admission status: Inpatient   Status is: Inpatient  Remains inpatient appropriate because:Hemodynamically unstable   Dispo: The patient is from: Home              Anticipated d/c is to: Home              Anticipated d/c date is: 3 days              Patient currently is not medically stable to d/c.       Darliss Cheney MD [facility]  11/22/2019, 11:02 PM   To contact the attending provider between 7A-7P or the covering provider during after hours 7P-7A, please log into the web site [email or web address]

## 2019-11-22 NOTE — ED Provider Notes (Signed)
[facility] DEPT Provider Note   CSN: [id number] Arrival date & time: 11/22/19  1912     History Chief Complaint  Patient presents with  . Fever  . Cancer    PARTH MCCORMAC is a 74 y.o. male.  Patient with history of lung cancer and chemotherapy supposedly on antibiotics for left foot infection presents to the ED with fever, generalized weakness.  States that his last infusion treatment was yesterday.  Does not know who prescribed antibiotics for his left foot or what the antibiotic is or how long he has been on it for.  Does have a history of diabetes.  Denies any cough or sputum production.  Has been vaccinated against coronavirus.  Does not have a port.  The history is provided by the patient.  Fever Temp source:  Subjective Severity:  Mild Onset quality:  Gradual Timing:  Constant Progression:  Unchanged Chronicity:  New Relieved by:  Nothing Worsened by:  Nothing Associated symptoms: no chest pain, no chills, no cough, no dysuria, no ear pain, no rash, no sore throat and no vomiting   Risk factors: hx of cancer and immunosuppression        Past Medical History:  Diagnosis Date  . Allergic rhinitis due to pollen   . Cancer ([place])   . Chronic kidney disease, stage III (moderate) 12/19/2012  . Depression   . Diabetes mellitus ([place])   . Diverticulosis   . Erectile dysfunction associated with type 2 diabetes mellitus ([place]) 05/28/2015  . Former very heavy cigarette smoker (more than 40 per day) 10/07/2014  . GERD (gastroesophageal reflux disease)   . Gout   . Hyperlipidemia   . Hypertension   . Hypogonadism male 06/14/2012  . Iron deficiency   . Memory loss   . Osteoarthritis   . Personal history of colonic polyps    1996    Patient Active Problem List   Diagnosis Date Noted  . Severe sepsis ([place]) 11/22/2019  . Non-small cell carcinoma of right lung, stage 3 ([place]) 10/23/2019  . Goals of care, counseling/discussion 10/23/2019  .  Encounter for antineoplastic chemotherapy 10/23/2019  . Lung mass 10/17/2019  . Small cell lung cancer ([place]) 10/12/2019  . Hemoptysis 10/12/2019  . PND (post-nasal drip) 09/20/2019  . Dropfoot 03/15/2019  . Mild cognitive impairment 05/08/2018  . Esophageal dysphagia   . Diabetic foot ulcer ([place]) 06/10/2017  . Uncontrolled type 2 diabetes mellitus with peripheral neuropathy ([place]) 05/11/2016  . Erectile dysfunction associated with type 2 diabetes mellitus ([place]) 05/28/2015  . Former very heavy cigarette smoker (more than 40 per day) 10/07/2014  . Chronic kidney disease, stage III (moderate) 12/19/2012  . Hypogonadism male 06/14/2012  . OSA (obstructive sleep apnea) 03/09/2011  . Diabetic neuropathy ([place]) 12/29/2010  . Family history of MI (myocardial infarction) 09/16/2010  . HIP REPLACEMENT, RIGHT, HX OF 02/05/2010  . Hypertriglyceridemia 06/13/2008  . GOUT 06/13/2008  . Major depressive disorder, recurrent episode, in partial remission ([place]) 06/13/2008  . Essential hypertension 06/13/2008  . Allergic rhinitis with postnasal drip 06/13/2008  . GERD 06/13/2008  . OSTEOARTHRITIS 06/13/2008    Past Surgical History:  Procedure Laterality Date  . BRONCHIAL NEEDLE ASPIRATION BIOPSY  10/17/2019   Procedure: BRONCHIAL NEEDLE ASPIRATION BIOPSIES;  Surgeon: Juanito Doom, MD;  Location: [facility];  Service: Cardiopulmonary;;  . CARDIAC CATHETERIZATION  11/2011   [facility]  . CHOLECYSTECTOMY  1980  . ENDOBRONCHIAL ULTRASOUND Bilateral 10/17/2019   Procedure: ENDOBRONCHIAL ULTRASOUND;  Surgeon: Lake Bells,  Ronie Spies, MD;  Location: [facility];  Service: Cardiopulmonary;  Laterality: Bilateral;  . ESOPHAGOGASTRODUODENOSCOPY (EGD) WITH PROPOFOL N/A 09/27/2017   Procedure: ESOPHAGOGASTRODUODENOSCOPY (EGD) WITH PROPOFOL;  Surgeon: Lin Landsman, MD;  Location: [facility];  Service: Gastroenterology;  Laterality: N/A;  . EYE SURGERY Right 07/29/2016   cataract - Dr. Manuella Ghazi  . EYE SURGERY  Left 08/23/2106   cataract - Dr. Manuella Ghazi  . HEMOSTASIS CONTROL  10/17/2019   Procedure: HEMOSTASIS CONTROL;  Surgeon: Juanito Doom, MD;  Location: [facility];  Service: Cardiopulmonary;;  . TOTAL HIP ARTHROPLASTY  2009   Dr. Gladstone Lighter  . TOTAL HIP ARTHROPLASTY         Family History  Problem Relation Age of Onset  . Diabetes Mother   . Alzheimer's disease Mother   . Diabetes Father   . Lung cancer Father        Age 41  . Stroke Father   . Heart attack Father   . Lung cancer Sister        lung  . Heart disease Maternal Grandfather   . COPD Sister   . Heart attack Sister   . Heart disease Sister     Social History   Tobacco Use  . Smoking status: Former Smoker    Packs/day: 3.00    Years: 35.00    Pack years: 105.00    Types: Cigarettes    Quit date: 04/12/2000    Years since quitting: 19.6  . Smokeless tobacco: Never Used  Vaping Use  . Vaping Use: Never used  Substance Use Topics  . Alcohol use: No  . Drug use: No    Home Medications Prior to Admission medications   Medication Sig Start Date End Date Taking? Authorizing Provider  acetaminophen (QC ACETAMINOPHEN 8HR ARTH PAIN) 650 MG CR tablet Take 650 mg by mouth every 8 (eight) hours as needed for pain.   Yes [provider]  ampicillin (PRINCIPEN) 500 MG capsule Take 500 mg by mouth daily as needed (flare up).  10/24/19  Yes [provider]  Ascorbic Acid (VITAMIN C) 1000 MG tablet Take 1,000 mg by mouth daily.   Yes [provider]  cholecalciferol (VITAMIN D) 1000 UNITS tablet Take 1,000 Units by mouth daily.     Yes [provider]  donepezil (ARICEPT) 10 MG tablet TAKE 1 TABLET BY MOUTH AT BEDTIME Patient taking differently: Take 10 mg by mouth at bedtime.  08/24/19  Yes Copland, Frederico Hamman, MD  doxazosin (CARDURA) 8 MG tablet TAKE ONE-HALF TABLET AT BEDTIME Patient taking differently: Take 4 mg by mouth every evening.  01/26/19  Yes Copland, Frederico Hamman, MD  fluticasone  (FLONASE) 50 MCG/ACT nasal spray PLACE 2 SPRAYS IN EACH NOSTRIL DAILY Patient taking differently: Place 2 sprays into both nostrils daily as needed for allergies or rhinitis.  01/26/19  Yes Copland, Frederico Hamman, MD  guaiFENesin-dextromethorphan (ROBITUSSIN DM) 100-10 MG/5ML syrup Take 5 mLs by mouth every 6 (six) hours as needed for cough. 10/14/19  Yes Arrien, Jimmy Picket, MD  Hyprom-Naphaz-Polysorb-Zn Sulf (CLEAR EYES COMPLETE OP) Place 1 drop into both eyes daily as needed (dry eyes).   Yes [provider]  insulin regular human CONCENTRATED (HUMULIN R U-500 KWIKPEN) 500 UNIT/ML kwikpen Inject under skin 80-90 units before b'fast, 60 units before lunch, 80-90 units vefore dinner Patient taking differently: Inject 20-40 Units into the skin See admin instructions. Takes 35-40 units before breakfast, 20 units at lunch and 35-40 units at night 09/17/19  Yes Philemon Kingdom,  MD  lidocaine-prilocaine (EMLA) cream Apply 1 application topically as needed. Patient taking differently: Apply 1 application topically as needed (port access).  11/19/19  Yes Heilingoetter, Cassandra L, [name]  memantine (NAMENDA) 10 MG tablet TAKE 1 TABLET BY MOUTH TWICE A DAY Patient taking differently: Take 10 mg by mouth 2 (two) times daily.  09/20/19  Yes Copland, Frederico Hamman, MD  montelukast (SINGULAIR) 10 MG tablet Take 1 tablet (10 mg total) by mouth at bedtime. 09/20/19  Yes Bedsole, Amy E, MD  omeprazole (PRILOSEC) 40 MG capsule Take 1 capsule (40 mg total) by mouth 2 (two) times daily before a meal. Patient taking differently: Take 40 mg by mouth daily.  08/21/18 04/06/28 Yes Copland, Frederico Hamman, MD  prochlorperazine (COMPAZINE) 10 MG tablet Take 1 tablet (10 mg total) by mouth every 6 (six) hours as needed. Patient taking differently: Take 10 mg by mouth every 6 (six) hours as needed for nausea.  10/23/19  Yes Heilingoetter, Cassandra L, [name]  simvastatin (ZOCOR) 40 MG tablet TAKE 1 TABLET BY MOUTH AT BEDTIME Patient taking  differently: Take 40 mg by mouth every evening.  04/30/19  Yes Copland, Frederico Hamman, MD  sucralfate (CARAFATE) 1 g tablet Take 1 tablet (1 g total) by mouth 4 (four) times daily. Dissolve each tablet in 15 cc water before use. 11/02/19  Yes Kyung Rudd, MD  venlafaxine XR (EFFEXOR-XR) 75 MG 24 hr capsule TAKE 1 CAPSULE BY MOUTH EVERY MORNING AND 2 CAPSULES AT BEDTIME Patient taking differently: Take 75 mg by mouth in the morning and at bedtime.  01/27/19  Yes Copland, Frederico Hamman, MD  Insulin Pen Needle (PEN NEEDLES) 31G X 5 MM MISC 1 each by Does not apply route 3 (three) times daily. To inject insulin E11.42 09/20/19   Philemon Kingdom, MD    Allergies    Tetracycline  Review of Systems   Review of Systems  Constitutional: Positive for fever. Negative for chills.  HENT: Negative for ear pain and sore throat.   Eyes: Negative for pain and visual disturbance.  Respiratory: Negative for cough and shortness of breath.   Cardiovascular: Negative for chest pain and palpitations.  Gastrointestinal: Negative for abdominal pain and vomiting.  Genitourinary: Negative for dysuria and hematuria.  Musculoskeletal: Negative for arthralgias and back pain.  Skin: Positive for color change and wound. Negative for rash.  Neurological: Negative for seizures and syncope.  All other systems reviewed and are negative.   Physical Exam Updated Vital Signs  ED Triage Vitals  Enc Vitals Group     BP 11/22/19 2038 98/66     Pulse Rate 11/22/19 2038 (!) 146     Resp 11/22/19 2038 (!) 23     Temp 11/22/19 2038 (!) 101.8 F (38.8 C)     Temp Source 11/22/19 2038 Oral     SpO2 11/22/19 2038 98 %     Weight 11/22/19 2038 211 lb (95.7 kg)     Height 11/22/19 2038 5\' 11"  (1.803 m)     Head Circumference --      Peak Flow --      Pain Score 11/22/19 2047 10     Pain Loc --      Pain Edu? --      Excl. in [place]? --     Physical Exam Vitals and nursing note reviewed.  Constitutional:      General: He is not in  acute distress.    Appearance: He is well-developed. He is ill-appearing.  HENT:  Head: Normocephalic and atraumatic.     Nose: Nose normal.     Mouth/Throat:     Mouth: Mucous membranes are moist.  Eyes:     Extraocular Movements: Extraocular movements intact.     Conjunctiva/sclera: Conjunctivae normal.     Pupils: Pupils are equal, round, and reactive to light.  Cardiovascular:     Rate and Rhythm: Normal rate and regular rhythm.     Pulses: Normal pulses.     Heart sounds: Normal heart sounds. No murmur heard.   Pulmonary:     Effort: Pulmonary effort is normal. No respiratory distress.     Breath sounds: Normal breath sounds.  Abdominal:     Palpations: Abdomen is soft.     Tenderness: There is no abdominal tenderness.  Musculoskeletal:        General: No tenderness. Normal range of motion.     Cervical back: Normal range of motion and neck supple.  Skin:    General: Skin is warm and dry.     Capillary Refill: Capillary refill takes less than 2 seconds.     Comments: Ulcer to the base of the left foot with some purulent drainage, surrounding redness and erythema but focally around the wound mostly white and pale skin as patient had this taped  Neurological:     General: No focal deficit present.     Mental Status: He is alert and oriented to person, place, and time.  Psychiatric:        Mood and Affect: Mood normal.     ED Results / Procedures / Treatments   Labs (all labs ordered are listed, but only abnormal results are displayed) Labs Reviewed  COMPREHENSIVE METABOLIC PANEL - Abnormal; Notable for the following components:      Result Value   Sodium 130 (*)    Chloride 92 (*)    Glucose, Bld 219 (*)    BUN 24 (*)    Creatinine, Ser 1.78 (*)    Calcium 8.8 (*)    Albumin 3.4 (*)    AST 12 (*)    GFR calc non Af Amer 37 (*)    GFR calc Af Amer 43 (*)    All other components within normal limits  LACTIC ACID, PLASMA - Abnormal; Notable for the following  components:   Lactic Acid, Venous 3.0 (*)    All other components within normal limits  CBC WITH DIFFERENTIAL/PLATELET - Abnormal; Notable for the following components:   WBC 0.9 (*)    RBC 3.53 (*)    Hemoglobin 9.8 (*)    HCT 28.2 (*)    MCV 79.9 (*)    Platelets 54 (*)    Neutro Abs 0.6 (*)    Lymphs Abs 0.1 (*)    All other components within normal limits  CBG MONITORING, ED - Abnormal; Notable for the following components:   Glucose-Capillary 236 (*)    All other components within normal limits  CULTURE, BLOOD (ROUTINE X 2)  CULTURE, BLOOD (ROUTINE X 2)  URINE CULTURE  SARS CORONAVIRUS 2 BY RT PCR (HOSPITAL ORDER, [place])  PROTIME-INR  APTT  LACTIC ACID, PLASMA  URINALYSIS, ROUTINE W REFLEX MICROSCOPIC  PROTIME-INR  CORTISOL-AM, BLOOD  PROCALCITONIN  HEMOGLOBIN A1C  MAGNESIUM  TSH  COMPREHENSIVE METABOLIC PANEL  CBC    EKG  EKG shows sinus tachycardia.  Radiology DG Chest 2 View  Result Date: 11/22/2019 CLINICAL DATA:  Suspected sepsis EXAM: CHEST - 2 VIEW COMPARISON:  10/12/2019, CT 11/15/2019 FINDINGS: No focal consolidation or pleural effusion. Normal heart size. Decreased right hilar mass. No pneumothorax. IMPRESSION: No acute airspace disease. Interval decrease in size of right hilar mass Electronically Signed   By: Donavan Foil M.D.   On: 11/22/2019 21:07   DG Foot Complete Left  Result Date: 11/22/2019 CLINICAL DATA:  Foot pain EXAM: LEFT FOOT - COMPLETE 3+ VIEW COMPARISON:  02/12/2019 FINDINGS: No acute displaced fracture or malalignment. Soft tissue ulceration at the mid sole of the foot. No radiopaque foreign body. Small plantar calcaneal spur. No osseous destructive change. Mild degenerative change at the first MTP joint. IMPRESSION: No acute osseous abnormality. Soft tissue ulceration at the mid sole of the foot. Electronically Signed   By: Donavan Foil M.D.   On: 11/22/2019 21:38    Procedures .Critical  Care Performed by: Lennice Sites, DO Authorized by: Lennice Sites, DO   Critical care provider statement:    Critical care time (minutes):  45   Critical care was necessary to treat or prevent imminent or life-threatening deterioration of the following conditions:  Sepsis   Critical care was time spent personally by me on the following activities:  Blood draw for specimens, development of treatment plan with patient or surrogate, discussions with primary provider, evaluation of patient's response to treatment, examination of patient, obtaining history from patient or surrogate, ordering and performing treatments and interventions, ordering and review of laboratory studies, ordering and review of radiographic studies, pulse oximetry, re-evaluation of patient's condition and review of old charts   I assumed direction of critical care for this patient from another provider in my specialty: no     (including critical care time)  Medications Ordered in ED Medications  vancomycin (VANCOREADY) IVPB 2000 mg/400 mL (2,000 mg Intravenous New Bag/Given 11/22/19 2229)  acetaminophen (TYLENOL) tablet 650 mg (has no administration in time range)  donepezil (ARICEPT) tablet 10 mg (has no administration in time range)  memantine (NAMENDA) tablet 10 mg (has no administration in time range)  venlafaxine XR (EFFEXOR-XR) 24 hr capsule 75 mg (has no administration in time range)  pantoprazole (PROTONIX) EC tablet 40 mg (has no administration in time range)  sucralfate (CARAFATE) tablet 1 g (has no administration in time range)  montelukast (SINGULAIR) tablet 10 mg (has no administration in time range)  insulin aspart (novoLOG) injection 0-15 Units (has no administration in time range)  insulin aspart (novoLOG) injection 0-5 Units (has no administration in time range)  0.9 %  sodium chloride infusion (has no administration in time range)  metroNIDAZOLE (FLAGYL) IVPB 500 mg (has no administration in time range)   ondansetron (ZOFRAN) tablet 4 mg (has no administration in time range)    Or  ondansetron (ZOFRAN) injection 4 mg (has no administration in time range)  simvastatin (ZOCOR) tablet 40 mg (has no administration in time range)  lactated ringers bolus 1,000 mL (1,000 mLs Intravenous New Bag/Given 11/22/19 2159)    And  lactated ringers bolus 1,000 mL (1,000 mLs Intravenous New Bag/Given 11/22/19 2159)    And  lactated ringers bolus 1,000 mL (1,000 mLs Intravenous New Bag/Given 11/22/19 2159)  piperacillin-tazobactam (ZOSYN) IVPB 3.375 g (0 g Intravenous Stopped 11/22/19 2230)    ED Course  I have reviewed the triage vital signs and the nursing notes.  Pertinent labs & imaging results that were available during my care of the patient were reviewed by me and considered in my medical decision making (see chart for details).    MDM  Rules/Calculators/A&P                          AVRY ROEDL is a 74 year old male with history of high cholesterol, hypertension, lung cancer on chemotherapy presents to the ED with weakness.  Patient found to be febrile to 101.8, tachycardic to the 150s, hypotensive to 10U systolic.  Code sepsis was initiated.  Supposedly has been on antibiotics for left foot infection.  However concern for also neutropenia and fever.  Sepsis work-up initiated and patient given broad-spectrum IV antibiotics.  Blood pressure improved with 30 cc/kg IV fluids.  Lactic acid was 3.  White count 0.9, neutropenic.  Creatinine mildly elevated at 1.78.  Otherwise lab work unremarkable.  X-ray shows no signs of osteomyelitis in the left foot.  There is a soft tissue ulceration at the sole of the foot.  Could be a source for infection.  Chest x-ray with no obvious pneumonia.  Given neutropenia and concern for foot infection patient to be admitted to medicine for further sepsis care.  Upon reevaluation blood pressure stabilized.  Heart rate is improving.  Mentation improving.  Overall to be admitted  for further sepsis care.  This chart was dictated using voice recognition software.  Despite best efforts to proofread,  errors can occur which can change the documentation meaning.  RODERICK CALO was evaluated in Emergency Department on 11/22/2019 for the symptoms described in the history of present illness. He was evaluated in the context of the global COVID-19 pandemic, which necessitated consideration that the patient might be at risk for infection with the SARS-CoV-2 virus that causes COVID-19. Institutional protocols and algorithms that pertain to the evaluation of patients at risk for COVID-19 are in a state of rapid change based on information released by regulatory bodies including the CDC and federal and state organizations. These policies and algorithms were followed during the patient's care in the ED.     Final Clinical Impression(s) / ED Diagnoses Final diagnoses:  Sepsis, due to unspecified organism, unspecified whether acute organ dysfunction present [facility])  Neutropenic fever ([place])  Cellulitis, unspecified cellulitis site    Rx / DC Orders ED Discharge Orders    None       Lennice Sites, DO 11/22/19 2259

## 2019-11-22 NOTE — Progress Notes (Signed)
A consult was received from an ED physician for Vancomycin per pharmacy dosing.  The patient's profile has been reviewed for ht/wt/allergies/indication/available labs.   A one time order has been placed for Vancomycin 2g.  Further antibiotics/pharmacy consults should be ordered by admitting physician if indicated.                       Thank you,  Gretta Arab PharmD, BCPS Clinical Pharmacist [facility] main pharmacy [phone] 11/22/2019 9:15 PM

## 2019-11-22 NOTE — ED Notes (Signed)
Patient transported to X-ray 

## 2019-11-23 ENCOUNTER — Ambulatory Visit
Admission: RE | Admit: 2019-11-23 | Discharge: 2019-11-23 | Disposition: A | Discharge status: Home / Self Care | Payer: Medicare Other | Source: Ambulatory Visit | Attending: Radiation Oncology | Admitting: Radiation Oncology

## 2019-11-23 ENCOUNTER — Inpatient Hospital Stay (HOSPITAL_COMMUNITY): Payer: Medicare Other | Admitting: Certified Registered Nurse Anesthetist

## 2019-11-23 ENCOUNTER — Inpatient Hospital Stay (HOSPITAL_COMMUNITY): Payer: Medicare Other

## 2019-11-23 ENCOUNTER — Encounter (HOSPITAL_COMMUNITY)
Admission: EM | Disposition: A | Discharge status: Home Health | Payer: Self-pay | Source: Home / Self Care | Attending: Family Medicine

## 2019-11-23 ENCOUNTER — Encounter (HOSPITAL_COMMUNITY): Payer: Self-pay | Admitting: Family Medicine

## 2019-11-23 DIAGNOSIS — E1122 Type 2 diabetes mellitus with diabetic chronic kidney disease: Secondary | ICD-10-CM | POA: Diagnosis not present

## 2019-11-23 DIAGNOSIS — C3431 Malignant neoplasm of lower lobe, right bronchus or lung: Secondary | ICD-10-CM | POA: Diagnosis not present

## 2019-11-23 DIAGNOSIS — L0390 Cellulitis, unspecified: Secondary | ICD-10-CM | POA: Diagnosis not present

## 2019-11-23 DIAGNOSIS — I129 Hypertensive chronic kidney disease with stage 1 through stage 4 chronic kidney disease, or unspecified chronic kidney disease: Secondary | ICD-10-CM | POA: Diagnosis not present

## 2019-11-23 DIAGNOSIS — N183 Chronic kidney disease, stage 3 unspecified: Secondary | ICD-10-CM | POA: Diagnosis not present

## 2019-11-23 DIAGNOSIS — L02612 Cutaneous abscess of left foot: Secondary | ICD-10-CM | POA: Diagnosis not present

## 2019-11-23 DIAGNOSIS — Z794 Long term (current) use of insulin: Secondary | ICD-10-CM | POA: Diagnosis not present

## 2019-11-23 DIAGNOSIS — L97529 Non-pressure chronic ulcer of other part of left foot with unspecified severity: Secondary | ICD-10-CM

## 2019-11-23 HISTORY — PX: IRRIGATION AND DEBRIDEMENT ABSCESS: SHX5252

## 2019-11-23 LAB — HEMOGLOBIN A1C
Hgb A1c MFr Bld: 10.2 % — ABNORMAL HIGH (ref 4.8–5.6)
Mean Plasma Glucose: 246.04 mg/dL

## 2019-11-23 LAB — URINE CULTURE: Culture: NO GROWTH

## 2019-11-23 LAB — CBC
HCT: 23.5 % — ABNORMAL LOW (ref 39.0–52.0)
Hemoglobin: 8 g/dL — ABNORMAL LOW (ref 13.0–17.0)
MCH: 27.6 pg (ref 26.0–34.0)
MCHC: 34 g/dL (ref 30.0–36.0)
MCV: 81 fL (ref 80.0–100.0)
Platelets: 34 10*3/uL — ABNORMAL LOW (ref 150–400)
RBC: 2.9 MIL/uL — ABNORMAL LOW (ref 4.22–5.81)
RDW: 13.2 % (ref 11.5–15.5)
WBC: 0.9 10*3/uL — CL (ref 4.0–10.5)
nRBC: 0 % (ref 0.0–0.2)

## 2019-11-23 LAB — GLUCOSE, CAPILLARY
Glucose-Capillary: 259 mg/dL — ABNORMAL HIGH (ref 70–99)
Glucose-Capillary: 266 mg/dL — ABNORMAL HIGH (ref 70–99)
Glucose-Capillary: 156 mg/dL — ABNORMAL HIGH (ref 70–99)
Glucose-Capillary: 228 mg/dL — ABNORMAL HIGH (ref 70–99)
Glucose-Capillary: 198 mg/dL — ABNORMAL HIGH (ref 70–99)
Glucose-Capillary: 170 mg/dL — ABNORMAL HIGH (ref 70–99)

## 2019-11-23 LAB — COMPREHENSIVE METABOLIC PANEL
ALT: 15 U/L (ref 0–44)
AST: 20 U/L (ref 15–41)
Albumin: 2.6 g/dL — ABNORMAL LOW (ref 3.5–5.0)
Alkaline Phosphatase: 71 U/L (ref 38–126)
Anion gap: 14 (ref 5–15)
BUN: 25 mg/dL — ABNORMAL HIGH (ref 8–23)
CO2: 23 mmol/L (ref 22–32)
Calcium: 8 mg/dL — ABNORMAL LOW (ref 8.9–10.3)
Chloride: 95 mmol/L — ABNORMAL LOW (ref 98–111)
Creatinine, Ser: 1.71 mg/dL — ABNORMAL HIGH (ref 0.61–1.24)
GFR calc Af Amer: 45 mL/min — ABNORMAL LOW (ref >60)
GFR calc non Af Amer: 39 mL/min — ABNORMAL LOW (ref >60)
Glucose, Bld: 295 mg/dL — ABNORMAL HIGH (ref 70–99)
Potassium: 3.7 mmol/L (ref 3.5–5.1)
Sodium: 132 mmol/L — ABNORMAL LOW (ref 135–145)
Total Bilirubin: 1.3 mg/dL — ABNORMAL HIGH (ref 0.3–1.2)
Total Protein: 5.6 g/dL — ABNORMAL LOW (ref 6.5–8.1)

## 2019-11-23 LAB — MAGNESIUM: Magnesium: 1.4 mg/dL — ABNORMAL LOW (ref 1.7–2.4)

## 2019-11-23 LAB — PROTIME-INR
INR: 1 (ref 0.8–1.2)
Prothrombin Time: 12.9 seconds (ref 11.4–15.2)

## 2019-11-23 LAB — PROCALCITONIN: Procalcitonin: 0.68 ng/mL

## 2019-11-23 LAB — LACTIC ACID, PLASMA: Lactic Acid, Venous: 1 mmol/L (ref 0.5–1.9)

## 2019-11-23 LAB — CORTISOL-AM, BLOOD: Cortisol - AM: 14.8 ug/dL (ref 6.7–22.6)

## 2019-11-23 LAB — CBG MONITORING, ED: Glucose-Capillary: 283 mg/dL — ABNORMAL HIGH (ref 70–99)

## 2019-11-23 LAB — TSH: TSH: 0.97 u[IU]/mL (ref 0.350–4.500)

## 2019-11-23 SURGERY — IRRIGATION AND DEBRIDEMENT ABSCESS
Anesthesia: Regional | Laterality: Left

## 2019-11-23 MED ORDER — CHLORHEXIDINE GLUCONATE 0.12 % MT SOLN
15.0000 mL | Freq: Once | OROMUCOSAL | Status: AC
  Administered 2019-11-23: 15 mL via OROMUCOSAL

## 2019-11-23 MED ORDER — FENTANYL CITRATE (PF) 100 MCG/2ML IJ SOLN
INTRAMUSCULAR | Status: AC
  Filled 2019-11-23: qty 2

## 2019-11-23 MED ORDER — ONDANSETRON HCL 4 MG/2ML IJ SOLN
INTRAMUSCULAR | Status: DC | PRN
  Administered 2019-11-23: 4 mg via INTRAVENOUS

## 2019-11-23 MED ORDER — TBO-FILGRASTIM 300 MCG/0.5ML ~~LOC~~ SOSY
300.0000 ug | PREFILLED_SYRINGE | Freq: Every day | SUBCUTANEOUS | Status: DC
  Administered 2019-11-24: 300 ug via SUBCUTANEOUS
  Filled 2019-11-23 (×2): qty 0.5

## 2019-11-23 MED ORDER — ROPIVACAINE HCL 5 MG/ML IJ SOLN
INTRAMUSCULAR | Status: DC | PRN
  Administered 2019-11-23: 30 mL via PERINEURAL
  Administered 2019-11-23: 10 mL via PERINEURAL

## 2019-11-23 MED ORDER — GADOBUTROL 1 MMOL/ML IV SOLN
8.0000 mL | Freq: Once | INTRAVENOUS | Status: AC | PRN
  Administered 2019-11-23: 8 mL via INTRAVENOUS

## 2019-11-23 MED ORDER — DEXAMETHASONE SODIUM PHOSPHATE 10 MG/ML IJ SOLN
INTRAMUSCULAR | Status: AC
  Filled 2019-11-23: qty 1

## 2019-11-23 MED ORDER — FENTANYL CITRATE (PF) 100 MCG/2ML IJ SOLN
INTRAMUSCULAR | Status: DC | PRN
  Administered 2019-11-23: 50 ug via INTRAVENOUS

## 2019-11-23 MED ORDER — ONDANSETRON HCL 4 MG/2ML IJ SOLN: 4.0000 mg | Freq: Once | INTRAMUSCULAR | Status: DC | PRN

## 2019-11-23 MED ORDER — LACTATED RINGERS IV SOLN: INTRAVENOUS | Status: DC

## 2019-11-23 MED ORDER — PROPOFOL 500 MG/50ML IV EMUL
INTRAVENOUS | Status: AC
  Filled 2019-11-23: qty 50

## 2019-11-23 MED ORDER — PROPOFOL 10 MG/ML IV BOLUS
INTRAVENOUS | Status: AC
  Filled 2019-11-23: qty 20

## 2019-11-23 MED ORDER — PHENYLEPHRINE HCL (PRESSORS) 10 MG/ML IV SOLN
INTRAVENOUS | Status: AC
  Filled 2019-11-23: qty 1

## 2019-11-23 MED ORDER — ACETAMINOPHEN 10 MG/ML IV SOLN
INTRAVENOUS | Status: DC | PRN
Start: 2019-11-23 — End: 2019-11-23
  Administered 2019-11-23: 1000 mg via INTRAVENOUS

## 2019-11-23 MED ORDER — FENTANYL CITRATE (PF) 100 MCG/2ML IJ SOLN: 25.0000 ug | INTRAMUSCULAR | Status: DC | PRN

## 2019-11-23 MED ORDER — SODIUM CHLORIDE 0.9 % IR SOLN
Status: DC | PRN
  Administered 2019-11-23: 3000 mL

## 2019-11-23 MED ORDER — SUCCINYLCHOLINE CHLORIDE 200 MG/10ML IV SOSY
PREFILLED_SYRINGE | INTRAVENOUS | Status: AC
  Filled 2019-11-23: qty 10

## 2019-11-23 MED ORDER — LIDOCAINE 2% (20 MG/ML) 5 ML SYRINGE
INTRAMUSCULAR | Status: AC
  Filled 2019-11-23: qty 5

## 2019-11-23 MED ORDER — ACETAMINOPHEN 10 MG/ML IV SOLN
INTRAVENOUS | Status: AC
  Filled 2019-11-23: qty 100

## 2019-11-23 MED ORDER — SODIUM CHLORIDE 0.9 % IV SOLN
2.0000 g | Freq: Two times a day (BID) | INTRAVENOUS | Status: DC
  Administered 2019-11-23 – 2019-11-26 (×7): 2 g via INTRAVENOUS
  Filled 2019-11-23 (×7): qty 2

## 2019-11-23 MED ORDER — ONDANSETRON HCL 4 MG/2ML IJ SOLN
INTRAMUSCULAR | Status: AC
  Filled 2019-11-23: qty 2

## 2019-11-23 MED ORDER — INSULIN REGULAR HUMAN (CONC) 500 UNIT/ML ~~LOC~~ SOPN
20.0000 [IU] | PEN_INJECTOR | Freq: Three times a day (TID) | SUBCUTANEOUS | Status: DC
  Administered 2019-11-24: 15 [IU] via SUBCUTANEOUS
  Filled 2019-11-23: qty 3

## 2019-11-23 MED ORDER — VANCOMYCIN HCL 1750 MG/350ML IV SOLN
1750.0000 mg | INTRAVENOUS | Status: DC
  Administered 2019-11-24: 1750 mg via INTRAVENOUS
  Filled 2019-11-23: qty 350

## 2019-11-23 MED ORDER — PROPOFOL 10 MG/ML IV BOLUS
INTRAVENOUS | Status: DC | PRN
  Administered 2019-11-23 (×2): 10 mg via INTRAVENOUS

## 2019-11-23 MED ORDER — PROPOFOL 500 MG/50ML IV EMUL
INTRAVENOUS | Status: DC | PRN
  Administered 2019-11-23: 35 ug/kg/min via INTRAVENOUS

## 2019-11-23 MED ORDER — LOPERAMIDE HCL 2 MG PO CAPS
4.0000 mg | ORAL_CAPSULE | Freq: Two times a day (BID) | ORAL | Status: DC
  Administered 2019-11-23 – 2019-11-27 (×10): 4 mg via ORAL
  Filled 2019-11-23 (×11): qty 2

## 2019-11-23 MED ORDER — INSULIN REGULAR HUMAN (CONC) 500 UNIT/ML ~~LOC~~ SOPN: 20.0000 [IU] | PEN_INJECTOR | SUBCUTANEOUS | Status: DC

## 2019-11-23 SURGICAL SUPPLY — 2 items
DRSG VAC ATS SM SENSATRAC (GAUZE/BANDAGES/DRESSINGS) ×6 IMPLANT
PACK ORTHO EXTREMITY (CUSTOM PROCEDURE TRAY) ×2 IMPLANT

## 2019-11-23 NOTE — Progress Notes (Signed)
ABI w/ TBI study completed  Preliminary results relayed to [facility], MD.    See CV Proc for preliminary results report.   Darlin Coco

## 2019-11-23 NOTE — Consult Note (Signed)
Chief Complaint: Left foot ulceration -sepsis.  HPI: [name] is a 74 y.o. male with medical history significant of small cell lung cancer, hypertension, CKD stage IIIa, type 2 diabetes mellitus, hyperlipidemia and chronic left foot ulcer presented to ED with a complaint of fever and chills.  According to patient, he was in his usual state of health until 4 PM when he suddenly started having fever and chills.  He came to the ED to seek medical attention.  Does not have any other complaint however he states that he feels that the source of infection is likely his left foot ulcer.  He further tells me that the foot ulcer has been ongoing for about 5 months for which he sees wound care as outpatient in [place] and the last time he was seen was about a month ago.  He believes that there was some drainage coming out of the left foot since last past few days and he has noticed some redness around it.  Denies having any other complaints such as chest pain, shortness of breath, nausea, vomiting, any problem with urination or with bowel movement.  He is currently getting chemotherapy for his lung cancer.  ED Course: Upon arrival to ED, he was febrile with 1-1.8, tachycardic and tachypneic.  BMP showed AKI on CKD stage IIIa.  CBC showed leukopenia with white cells of 0.9.  Microcytic anemia with stable hemoglobin.  Lactic acid 3.0.  Foot x-ray does not show any acute abnormality.  Chest x-ray is normal.  COVID-19 pending.  He is fully vaccinated against COVID-19.  Hospital service were consulted to admit the patient for severe sepsis of unknown source.    Hospital course: I was contacted today for evaluation and treatment of his left foot ulceration.  Patient has been on antibiotics, and recently completed his MRI.  Review of systems: Patient has recent diagnosis of lung cancer status post chemotherapy. Patient was admitted with fever, chills, and elevated white count -sepsis. Patient denies any  nausea or vomiting. Patient denies any loss of consciousness, or dizziness at this time. Please refer to admission H&P for further details.  Past Medical History:  Diagnosis Date  . Allergic rhinitis due to pollen   . Cancer ([place])   . Chronic kidney disease, stage III (moderate) 12/19/2012  . Depression   . Diabetes mellitus ([place])   . Diverticulosis   . Erectile dysfunction associated with type 2 diabetes mellitus ([place]) 05/28/2015  . Former very heavy cigarette smoker (more than 40 per day) 10/07/2014  . GERD (gastroesophageal reflux disease)   . Gout   . Hyperlipidemia   . Hypertension   . Hypogonadism male 06/14/2012  . Iron deficiency   . Memory loss   . Osteoarthritis   . Personal history of colonic polyps    1996    Allergies  Allergen Reactions  . Tetracycline Itching and Rash    No current facility-administered medications on file prior to encounter.   Current Outpatient Medications on File Prior to Encounter  Medication Sig Dispense Refill  . acetaminophen (QC ACETAMINOPHEN 8HR ARTH PAIN) 650 MG CR tablet Take 650 mg by mouth every 8 (eight) hours as needed for pain.    [name] Kitchen ampicillin (PRINCIPEN) 500 MG capsule Take 500 mg by mouth daily as needed (flare up).     . Ascorbic Acid (VITAMIN C) 1000 MG tablet Take 1,000 mg by mouth daily.    . cholecalciferol (VITAMIN D) 1000 UNITS tablet Take 1,000 Units  by mouth daily.      [name] Kitchen donepezil (ARICEPT) 10 MG tablet TAKE 1 TABLET BY MOUTH AT BEDTIME (Patient taking differently: Take 10 mg by mouth at bedtime. ) 90 tablet 3  . doxazosin (CARDURA) 8 MG tablet TAKE ONE-HALF TABLET AT BEDTIME (Patient taking differently: Take 4 mg by mouth every evening. ) 45 tablet 3  . fluticasone (FLONASE) 50 MCG/ACT nasal spray PLACE 2 SPRAYS IN EACH NOSTRIL DAILY (Patient taking differently: Place 2 sprays into both nostrils daily as needed for allergies or rhinitis. ) 48 mL 3  . guaiFENesin-dextromethorphan (ROBITUSSIN DM) 100-10 MG/5ML syrup Take 5  mLs by mouth every 6 (six) hours as needed for cough. 118 mL 0  . Hyprom-Naphaz-Polysorb-Zn Sulf (CLEAR EYES COMPLETE OP) Place 1 drop into both eyes daily as needed (dry eyes).    . insulin regular human CONCENTRATED (HUMULIN R U-500 KWIKPEN) 500 UNIT/ML kwikpen Inject under skin 80-90 units before b'fast, 60 units before lunch, 80-90 units vefore dinner (Patient taking differently: Inject 20-40 Units into the skin See admin instructions. Takes 35-40 units before breakfast, 20 units at lunch and 35-40 units at night) 6 pen 5  . lidocaine-prilocaine (EMLA) cream Apply 1 application topically as needed. (Patient taking differently: Apply 1 application topically as needed (port access). ) 30 g 2  . memantine (NAMENDA) 10 MG tablet TAKE 1 TABLET BY MOUTH TWICE A DAY (Patient taking differently: Take 10 mg by mouth 2 (two) times daily. ) 180 tablet 3  . montelukast (SINGULAIR) 10 MG tablet Take 1 tablet (10 mg total) by mouth at bedtime. 30 tablet 3  . omeprazole (PRILOSEC) 40 MG capsule Take 1 capsule (40 mg total) by mouth 2 (two) times daily before a meal. (Patient taking differently: Take 40 mg by mouth daily. ) 180 capsule 1  . prochlorperazine (COMPAZINE) 10 MG tablet Take 1 tablet (10 mg total) by mouth every 6 (six) hours as needed. (Patient taking differently: Take 10 mg by mouth every 6 (six) hours as needed for nausea. ) 30 tablet 2  . simvastatin (ZOCOR) 40 MG tablet TAKE 1 TABLET BY MOUTH AT BEDTIME (Patient taking differently: Take 40 mg by mouth every evening. ) 90 tablet 2  . sucralfate (CARAFATE) 1 g tablet Take 1 tablet (1 g total) by mouth 4 (four) times daily. Dissolve each tablet in 15 cc water before use. 120 tablet 2  . venlafaxine XR (EFFEXOR-XR) 75 MG 24 hr capsule TAKE 1 CAPSULE BY MOUTH EVERY MORNING AND 2 CAPSULES AT BEDTIME (Patient taking differently: Take 75 mg by mouth in the morning and at bedtime. ) 270 capsule 1  . Insulin Pen Needle (PEN NEEDLES) 31G X 5 MM MISC 1 each by  Does not apply route 3 (three) times daily. To inject insulin E11.42 90 each 2    Physical Exam: Vitals:   11/23/19 0837 11/23/19 1159  BP: (!) 142/67 (!) 156/76  Pulse: (!) 109 (!) 104  Resp: 16 15  Temp: 98.2 F (36.8 C) 97.9 F (36.6 C)  SpO2: 100% 100%   Body mass index is 25.7 kg/m.  Patient is alert and oriented x3. No shortness of breath or chest pain at present. Abdomen is soft and nontender, he denies any incontinence of bowel and bladder or any rebound tenderness. Patient has decreased sensation to light touch diffusely in the lower extremities bilaterally especially from the knee to the foot. Left foot ulceration: Plantar aspect of the foot over the fourth and fifth metatarsals.  Positive drainage noted.  Positive foul odor noted.  Some erythema and streaking is noted along the plantar aspect of the foot only.  Compartments are soft and nontender.  No knee, ankle, hip pain with isolated joint range of motion.  No other ulcerations are noted.  EHL/tibialis anterior/gastrocnemius is intact.  Patient is ambulatory.   Image: DG Chest 2 View  Result Date: 11/22/2019 CLINICAL DATA:  Suspected sepsis EXAM: CHEST - 2 VIEW COMPARISON:  10/12/2019, CT 11/15/2019 FINDINGS: No focal consolidation or pleural effusion. Normal heart size. Decreased right hilar mass. No pneumothorax. IMPRESSION: No acute airspace disease. Interval decrease in size of right hilar mass Electronically Signed   By: Donavan Foil M.D.   On: 11/22/2019 21:07   CT Angio Chest PE W/Cm &/Or Wo Cm  Result Date: 11/15/2019 CLINICAL DATA:  Syncope. EXAM: CT ANGIOGRAPHY CHEST WITH CONTRAST TECHNIQUE: Multidetector CT imaging of the chest was performed using the standard protocol during bolus administration of intravenous contrast. Multiplanar CT image reconstructions and MIPs were obtained to evaluate the vascular anatomy. CONTRAST:  21mL OMNIPAQUE IOHEXOL 350 MG/ML SOLN COMPARISON:  CT chest dated October 11, 2019  FINDINGS: Cardiovascular: Contrast injection is sufficient to demonstrate satisfactory opacification of the pulmonary arteries to the segmental level. There is no pulmonary embolus or evidence of right heart strain. The size of the main pulmonary artery is normal. Heart size is normal, with no pericardial effusion. There are coronary artery calcifications. There are minimal atherosclerotic changes of the thoracic aorta without evidence for an aneurysm. There is no significant pericardial effusion. Mediastinum/Nodes: --again noted are pathologically enlarged mediastinal lymph nodes. The dominant lymph node measures approximately 2.4 x 4.3 cm (previously measuring 2.7 by 6 cm). --there are pathologically enlarged right hilar lymph nodes. -- No axillary lymphadenopathy. -- No supraclavicular lymphadenopathy. --there is a right-sided hypoattenuating thyroid nodule measuring approximately 1.5 cm (axial series 4, image 9). -  Unremarkable esophagus. Lungs/Pleura: Again noted is a right hilar mass. This mass has demonstrated significant interval decrease in size and currently measures approximately 4.7 x 4.8 cm (previously measuring approximately 6.1 x 5.8 cm). There is new cavitation within the central aspect of this mass. There is no pneumothorax. No large pleural effusion. Upper Abdomen: Contrast bolus timing is not optimized for evaluation of the abdominal organs. The patient is status post prior cholecystectomy. There is a probable nonobstructing stone in the upper pole the left kidney. There is variant hepatic arterial anatomy with a replaced common hepatic artery. Musculoskeletal: There are healing fractures of the anterior there through fifth ribs on the patient's left. There is no new acute displaced fracture. Review of the MIP images confirms the above findings. IMPRESSION: 1. No evidence for acute pulmonary embolism. 2. Findings consistent with a positive response to treatment as evidence by interval decrease in  size of the right hilar mass and mediastinal lymph nodes. 3. Right-sided hypoattenuating thyroid nodule measuring approximately 1.5 cm. Outpatient follow-up ultrasound is recommended if it has not already been performed.(Ref: J Am Coll Radiol. 2015 Feb;12(2): 143-50). 4. Healing left-sided rib fractures. Aortic Atherosclerosis (ICD10-I70.0). Electronically Signed   By: Constance Holster M.D.   On: 11/15/2019 18:37   MR Brain W Wo Contrast  Result Date: 11/04/2019 CLINICAL DATA:  Non-small cell lung carcinoma staging EXAM: MRI HEAD WITHOUT AND WITH CONTRAST TECHNIQUE: Multiplanar, multiecho pulse sequences of the brain and surrounding structures were obtained without and with intravenous contrast. CONTRAST:  45mL GADAVIST GADOBUTROL 1 MMOL/ML IV SOLN COMPARISON:  None. FINDINGS:  BRAIN: No acute infarct, acute hemorrhage or extra-axial collection. Multifocal white matter hyperintensity, most commonly due to chronic ischemic microangiopathy. There is generalized atrophy without lobar predilection. No chronic microhemorrhage. Normal midline structures. There is no abnormal contrast enhancement. VASCULAR: Major flow voids are preserved. SKULL AND UPPER CERVICAL SPINE: Normal calvarium and skull base. Visualized upper cervical spine and soft tissues are normal. SINUSES/ORBITS: No paranasal sinus fluid levels or advanced mucosal thickening. No mastoid or middle ear effusion. Normal orbits. IMPRESSION: 1. No intracranial metastatic disease. 2. Generalized atrophy and chronic ischemic microangiopathy. Electronically Signed   By: Ulyses Jarred M.D.   On: 11/04/2019 20:56   MR FOOT LEFT W WO CONTRAST  Result Date: 11/23/2019 CLINICAL DATA:  Chronic left foot ulcer with erythema and recent drainage. History of lung cancer. Concern for osteomyelitis. EXAM: MRI OF THE LEFT FOREFOOT WITHOUT AND WITH CONTRAST TECHNIQUE: Multiplanar, multisequence MR imaging of the left forefoot was performed both before and after  administration of intravenous contrast. CONTRAST:  70mL GADAVIST GADOBUTROL 1 MMOL/ML IV SOLN COMPARISON:  Radiographs 11/22/2019 and 02/12/2019 FINDINGS: Bones/Joint/Cartilage There is soft tissue ulceration along the plantar aspect of the midfoot at the level of the 4th and 5th metatarsal bases. There appears to be mild soft tissue ulceration along the plantar aspect of the 5th metatarsal head as well. No underlying cortical destruction, marrow edema or suspicious marrow enhancement. There are mild midfoot and 1st MTP degenerative changes without significant joint effusions. The tibial sesamoid of the 1st metatarsal is bipartite. The distal toes are incompletely visualized. Possible nondisplaced transverse fracture through the base of the 5th proximal phalanx. Ligaments Intact Lisfranc ligament. 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 No focal intramuscular fluid collection or tenosynovitis. The ankle tendons appear intact. Soft tissues As above, apparent soft tissue ulceration along the plantar aspect of the 4th and 5th metatarsal bases and the 5th metatarsal head. There is associated heterogeneous enhancement following contrast, but no evidence of drainable fluid collection or foreign body. IMPRESSION: 1. Soft tissue ulceration along the plantar aspect of the midfoot at the level of the 4th and 5th metatarsal bases and the 5th metatarsal head. No evidence of drainable fluid collection or foreign body. 2. No evidence of osteomyelitis. 3. Possible nondisplaced transverse fracture through the base of the 5th proximal phalanx. Electronically Signed   By: Richardean Sale M.D.   On: 11/23/2019 16:31   NM PET Image Initial (PI) Skull Base To Thigh  Result Date: 11/01/2019 CLINICAL DATA:  Initial treatment strategy for lung cancer. EXAM: NUCLEAR MEDICINE PET SKULL BASE TO THIGH TECHNIQUE: 9.9 mCi F-18 FDG was injected intravenously. Full-ring PET  imaging was performed from the skull base to thigh after the radiotracer. CT data was obtained and used for attenuation correction and anatomic localization. Fasting blood glucose: 342 mg/dl COMPARISON:  10/11/2019 FINDINGS: Mediastinal blood pool activity: SUV max 3.42 Liver activity: SUV max NA Note: Patient presents today with abnormally elevated fasting blood glucose at 342. Exam was attempted yesterday however the fasting blood glucose was even higher. Patient reports difficulty with achieving a normal blood glucose level and therefore the decision was made to proceed with the exam knowing that a markedly elevated fasting blood sugar may reduce sensitivity of this exam as the radiotracer is an analog of blood glucose. Neck: No FDG avid mass or adenopathy identified. Incidental CT findings: None CHEST: Superior segment of right lower lobe perihilar lung mass is again noted. The  mass appears internally necrotic with a maximum dimension of 6.4 cm with SUV max of 4.6, image 76/4. The tumor invades the mediastinum and has mass effect the posterior wall of the right mainstem bronchus. Encasement and narrowing of the proximal right upper lobe bronchus and bronchus intermedius noted. Signs of postobstructive pneumonitis versus lymphangitic spread of disease is noted within the posteromedial right upper lobe, image 67/4. Small subpleural nodule is identified within the posterior aspect of the superior segment of right lower lobe measuring 10 mm. Mild FDG uptake with SUV max of 1.3 is associated with this nodule, image 84/4. Sub solid, cystic lesion within the lateral right lung base measures approximately 1 cm without significant FDG uptake. No suspicious pulmonary nodule or mass within the contralateral lung. Large subcarinal lymph node has a short axis of 2.3 cm and an SUV max of 3.7. Right paratracheal lymph node measures 2.8 cm and appears predominantly necrotic within SUV max of 2.7. Adjacent right paratracheal  lymph node measures 1.5 cm with SUV max of 3.7. No left-sided mediastinal or hilar adenopathy. No FDG avid supraclavicular or axillary lymph nodes. Incidental CT findings: Aortic atherosclerosis. Lad and RCA coronary artery calcifications. ABDOMEN/PELVIS: No FDG uptake within the liver, pancreas, spleen, or adrenal glands. No FDG avid lymph nodes within the abdomen or pelvis. Incidental CT findings: Cholecystectomy. Aortic atherosclerosis. 3 mm stone is identified within upper pole of left kidney. 3 cm cyst arises from the lateral cortex of the right kidney. SKELETON: No focal hypermetabolic activity to suggest skeletal metastasis. Incidental CT findings: Previous right hip arthroplasty. Posttraumatic rib deformities are noted involving the anterior aspect of the left third and fourth ribs with expected mild FDG uptake. IMPRESSION: 1. Note: Patient presented for the second day in a row with markedly elevated fasting blood sugars. The radiotracer is a glucose analog and ideally the exam should be performed with a normal fasting blood glucose level as elevated blood glucose may diminish the sensitivity for detecting FDG avid disease. 2. FDG avid necrotic perihilar lung mass involving the superior segment of right lower lobe is identified compatible with primary bronchogenic carcinoma. 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 Imaging findings are compatible with at least a T3N2M0 lesion, stage IIIb disease. 3. Involvement of the right mainstem bronchus, right upper lobe bronchi and bronchus intermedius noted. Postobstructive pneumonitis versus lymphangitic spread of disease is noted within the central portion of the posteromedial right upper lobe. 4. Enlarged FDG avid lymph nodes are identified within the right paratracheal nodal chain and subcarinal region. Dominant right paratracheal lymph node is centrally necrotic with expected decreased FDG uptake. 5. There are 2  small subpleural nodules within the posterior right lower lobe. Cannot rule out satellite lesions. 6. Sub solid, cystic nodule is noted within the lateral right lung base. No significant tracer uptake associated with this nodule. 7. No signs of metastatic disease to the abdomen or pelvis or skeletal structures. 8. Subacute, posttraumatic rib deformities are noted involving the anterior aspect of the left third and fourth ribs 9. Aortic Atherosclerosis (ICD10-I70.0) and Emphysema (ICD10-J43.9). 10. Multi vessel coronary artery atherosclerotic calcifications. Electronically Signed   By: Kerby Moors M.D.   On: 11/01/2019 10:56   DG Foot Complete Left  Result Date: 11/22/2019 CLINICAL DATA:  Foot pain EXAM: LEFT FOOT - COMPLETE 3+ VIEW COMPARISON:  02/12/2019 FINDINGS: No acute displaced fracture or malalignment. Soft tissue ulceration at the mid sole of the foot. No radiopaque foreign body.  Small plantar calcaneal spur. No osseous destructive change. Mild degenerative change at the first MTP joint. IMPRESSION: No acute osseous abnormality. Soft tissue ulceration at the mid sole of the foot. Electronically Signed   By: Donavan Foil M.D.   On: 11/22/2019 21:38   VAS [place] ABI WITH/WO TBI  Result Date: 11/23/2019 LOWER EXTREMITY DOPPLER STUDY Indications: Ulceration of LT foot. High Risk Factors: Hypertension, Diabetes.  Comparison Study: No prior studies. Performing Technologist: Darlin Coco  Examination Guidelines: A complete evaluation includes at minimum, Doppler waveform signals and systolic blood pressure reading at the level of bilateral brachial, anterior tibial, and posterior tibial arteries, when vessel segments are accessible. Bilateral testing is considered an integral part of a complete examination. Photoelectric Plethysmograph (PPG) waveforms and toe systolic pressure readings are included as required and additional duplex testing as needed. Limited examinations for reoccurring indications may be  performed as noted.  ABI Findings: +---------+------------------+-----+---------+--------+ Right    Rt Pressure (mmHg)IndexWaveform Comment  +---------+------------------+-----+---------+--------+ Brachial 154                    triphasic         +---------+------------------+-----+---------+--------+ PTA      161               1.05 triphasic         +---------+------------------+-----+---------+--------+ DP       143               0.93 triphasic         +---------+------------------+-----+---------+--------+ Great Toe100               0.65 Abnormal          +---------+------------------+-----+---------+--------+ +---------+------------------+-----+---------+-------+ Left     Lt Pressure (mmHg)IndexWaveform Comment +---------+------------------+-----+---------+-------+ Brachial 152                    triphasic        +---------+------------------+-----+---------+-------+ PTA      155               1.01 triphasic        +---------+------------------+-----+---------+-------+ DP       114               0.74 triphasic        +---------+------------------+-----+---------+-------+ Great Toe120               0.78 Normal           +---------+------------------+-----+---------+-------+ +-------+-----------+-----------+------------+------------+ ABI/TBIToday's ABIToday's TBIPrevious ABIPrevious TBI +-------+-----------+-----------+------------+------------+ Right  1.05       0.65                                +-------+-----------+-----------+------------+------------+ Left   1.01       0.78                                +-------+-----------+-----------+------------+------------+  Summary: Right: Resting right ankle-brachial index is within normal range. No evidence of significant right lower extremity arterial disease. The right toe-brachial index is mildly abnormal. Left: Resting left ankle-brachial index is within normal range. No evidence of  significant left lower extremity arterial disease. The left toe-brachial index is normal.  *See table(s) above for measurements and observations.     Preliminary    A/P: [name] is a very pleasant 74 year old gentleman with multiple medical issues including small  cell lung CA, hypertension, stage III renal disease, type 2 diabetes and a chronic left foot ulcer.  He presented to the emergency room with increasing fevers and chills yesterday.  I was notified today concerning management of his foot ulcer.  Patient was admitted because of the significant wound infection with signs and symptoms consistent with sepsis.  On exam he does have a significant ulceration on the plantar aspect of the foot.  Patient notes that it has been draining and produces a foul odor.  He states he has been treated at the wound clinic in [place] with antibiotics but he is unsure as to how long he has been on them or which ones he was taking.  There is some red streaking on the plantar aspect of the foot but no evidence of streaking into the calf.  The compartments are soft and nontender.  While he does not have any focal motor deficits he does have an insensate foot secondary to his diabetes.  Fortunately, his ABIs were normal.  MRI of the foot demonstrates the following: 1. Soft tissue ulceration along the plantar aspect of the midfoot at the level of the 4th and 5th metatarsal bases and the 5th metatarsal head. No evidence of drainable fluid collection or foreign body. 2. No evidence of osteomyelitis. 3. Possible nondisplaced transverse fracture through the base of the 5th proximal phalanx.  Plan: Given his history of small cell lung CA and the fact that he is immunocompromise secondary to the chemotherapy I do believe that more aggressive treatment is reasonable.  I will perform an I&D of the foot and apply a wound VAC.  I did inform the patient that he may ultimately require an amputation.  I have discussed his care with  Dr. Sharol Given and he is willing to assume care next week.  I will defer definitive treatment to Dr. Sharol Given.   Risks of surgery include: Infection, bleeding, nerve damage, death, stroke, paralysis, need for additional surgery including amputation, and/or additional debridements.  Patient has expressed an understanding of the risks and all of his questions were addressed.  Surgical plan: Formal I&D of the left foot with application of wound VAC.  I have spoken with Dr. Verlon Au and he indicates that the patient is medically cleared for surgery.  I have asked him to contact the infectious disease service for further evaluation and to determine antibiotic selection.  Since he does have normal ABIs I have also recommended that he set the patient up for a PICC line for IV antibiotic therapy.

## 2019-11-23 NOTE — ED Notes (Signed)
Pt stated that they just went to the restroom. Will let us know when he has to go again.

## 2019-11-23 NOTE — Anesthesia Procedure Notes (Signed)
Anesthesia Regional Block: Adductor canal block   Pre-Anesthetic Checklist: ,, timeout performed, Correct Patient, Correct Site, Correct Laterality, Correct Procedure, Correct Position, site marked, Risks and benefits discussed,  Surgical consent,  Pre-op evaluation,  At surgeon's request and post-op pain management  Laterality: Left  Prep: chloraprep       Needles:  Injection technique: Single-shot  Needle Type: Echogenic Needle     Needle Length: 9cm  Needle Gauge: 21     Additional Needles:   Procedures:,,,, ultrasound used (permanent image in chart),,,,  Narrative:  Start time: 11/23/2019 5:26 PM End time: 11/23/2019 5:31 PM Injection made incrementally with aspirations every 5 mL.  Performed by: Personally  Anesthesiologist: Catalina Gravel, MD  Additional Notes: No pain on injection. No increased resistance to injection. Injection made in 5cc increments.  Good needle visualization.  Patient tolerated procedure well.

## 2019-11-23 NOTE — Progress Notes (Signed)
Inpatient Diabetes Program Recommendations  AACE/ADA: New Consensus Statement on Inpatient Glycemic Control (2015)  Target Ranges:  Prepandial:   less than 140 mg/dL      Peak postprandial:   less than 180 mg/dL (1-2 hours)      Critically ill patients:  140 - 180 mg/dL   Lab Results  Component Value Date   GLUCAP 266 (H) 11/23/2019   HGBA1C 10.2 (H) 11/23/2019    Review of Glycemic Control  Diabetes history: DM2 Outpatient Diabetes medications: U-500 35-40 units at Breakfast, 20 units at lunch and 35-40 units at dinner Current orders for Inpatient glycemic control: Novolog 0-15 units tidwc and hs  Hgba1C - 10.2% - improved from 13.0% on 09/17/19. Endo is Dr Cruzita Lederer. Per Dr Cruzita Lederer - on 10/16/19 - orders were U-500 50-40-50 tidac.  Blood sugars ranging from 156-283 mg/dL over past 24H.  May benefit from restarting reduced amount of U-500 insulin. Needs tight glycemic control for healing wound.  Inpatient Diabetes Program Recommendations:     U-500 25-15-25 units tidac.  Will follow closely.  Thank you. Lorenda Peck, RD, LDN, CDE Inpatient Diabetes Coordinator [phone]

## 2019-11-23 NOTE — Progress Notes (Signed)
Code Sepsis completion note: BC were collected before pt received ABX. Had allotted IVF based on weight. Second LA was delayed in ED, but resulted at 1.0. Will be admitted to Progressive unit for further monitoring.   [place] RN 03:48 AM

## 2019-11-23 NOTE — Progress Notes (Signed)
Pharmacy Antibiotic Note  [name] is a 74 y.o. male admitted on 11/22/2019 with sepsis.  Pharmacy has been consulted for Vancomycin, cefepime dosing.  Plan: Vancomycin 2gm iv x1, then Vancomycin 1750 mg IV Q 36 hrs. Goal AUC 400-550. Expected AUC: 511 SCr used: 1.71  Zosyn 3.375gm iv x1, then Cefepime 2gm iv q12hr   Height: 5\' 11"  (180.3 cm) Weight: 83.6 kg (184 lb 4.8 oz) IBW/kg (Calculated) : 75.3  Temp (24hrs), Avg:99.3 F (37.4 C), Min:98 F (36.7 C), Max:101.8 F (38.8 C)  Recent Labs  Lab 11/19/19 1031 11/22/19 2115 11/23/19 0233  WBC 3.3* 0.9* 0.9*  CREATININE 1.37* 1.78* 1.71*  LATICACIDVEN  --  3.0* 1.0    Estimated Creatinine Clearance: 40.4 mL/min (A) (by C-G formula based on SCr of 1.71 mg/dL (H)).    Allergies  Allergen Reactions  . Tetracycline Itching and Rash    Antimicrobials this admission: Zosyn 11/22/2019 x1 Vancomycin 11/22/2019 >> Cefepime 11/23/2019 >>    Dose adjustments this admission: -  Microbiology results: -  Thank you for allowing pharmacy to be a part of this patient's care.  Nani Skillern Crowford 11/23/2019 5:51 AM

## 2019-11-23 NOTE — Anesthesia Procedure Notes (Signed)
Anesthesia Regional Block: Popliteal block   Pre-Anesthetic Checklist: ,, timeout performed, Correct Patient, Correct Site, Correct Laterality, Correct Procedure, Correct Position, site marked, Risks and benefits discussed,  Surgical consent,  Pre-op evaluation,  At surgeon's request and post-op pain management  Laterality: Left  Prep: chloraprep       Needles:  Injection technique: Single-shot  Needle Type: Echogenic Needle     Needle Length: 9cm  Needle Gauge: 21     Additional Needles:   Procedures:,,,, ultrasound used (permanent image in chart),,,,  Narrative:  Start time: 11/23/2019 5:16 PM End time: 11/23/2019 5:26 PM Injection made incrementally with aspirations every 5 mL.  Performed by: Personally  Anesthesiologist: Catalina Gravel, MD  Additional Notes: No pain on injection. No increased resistance to injection. Injection made in 5cc increments.  Good needle visualization.  Patient tolerated procedure well.

## 2019-11-23 NOTE — Anesthesia Postprocedure Evaluation (Signed)
Anesthesia Post Note  Patient: [name]  Procedure(s) Performed: IRRIGATION AND DEBRIDEMENT ABSCESS (Left )     Patient location during evaluation: PACU Anesthesia Type: Regional Level of consciousness: awake and alert, awake and oriented Pain management: pain level controlled Vital Signs Assessment: post-procedure vital signs reviewed and stable Respiratory status: spontaneous breathing, nonlabored ventilation, respiratory function stable and patient connected to nasal cannula oxygen Cardiovascular status: stable and blood pressure returned to baseline Postop Assessment: no apparent nausea or vomiting Anesthetic complications: no   No complications documented.  Last Vitals:  Vitals:   11/23/19 1845 11/23/19 1900  BP: 134/68 121/65  Pulse: (!) 103 (!) 101  Resp: 15 (!) 22  Temp:  36.7 C  SpO2: 100% 98%    Last Pain:  Vitals:   11/23/19 1900  TempSrc:   PainSc: 0-No pain                 Catalina Gravel

## 2019-11-23 NOTE — Transfer of Care (Signed)
Immediate Anesthesia Transfer of Care Note  Patient: [name]  Procedure(s) Performed: IRRIGATION AND DEBRIDEMENT ABSCESS (Left )  Patient Location: PACU  Anesthesia Type:MAC combined with regional for post-op pain  Level of Consciousness: awake, alert , oriented and patient cooperative  Airway & Oxygen Therapy: Patient Spontanous Breathing and Patient connected to face mask oxygen  Post-op Assessment: Report given to RN and Post -op Vital signs reviewed and stable  Post vital signs: Reviewed and stable  Last Vitals:  Vitals Value Taken Time  BP 155/74 11/23/19 1836  Temp    Pulse 101 11/23/19 1839  Resp 13 11/23/19 1839  SpO2 100 % 11/23/19 1839  Vitals shown include unvalidated device data.  Last Pain:  Vitals:   11/23/19 1159  TempSrc: Oral  PainSc:       Patients Stated Pain Goal: 0 (25/49/82 6415)  Complications: No complications documented.

## 2019-11-23 NOTE — Plan of Care (Signed)

## 2019-11-23 NOTE — Brief Op Note (Signed)
11/23/2019  6:33 PM  PATIENT:  [name]  74 y.o. male  PRE-OPERATIVE DIAGNOSIS:  LEFT FOOT ABCESS  POST-OPERATIVE DIAGNOSIS:  LEFT FOOT ABCESS  PROCEDURE:  Procedure(s): IRRIGATION AND DEBRIDEMENT ABSCESS (Left)  SURGEON:  Surgeon(s) and Role:    Melina Schools, MD - Primary  PHYSICIAN ASSISTANT:   ASSISTANTS: none   ANESTHESIA:   regional  EBL:  10 mL   BLOOD ADMINISTERED:none  DRAINS: wound vac   LOCAL MEDICATIONS USED:  NONE  SPECIMEN:  Source of Specimen:  left plantar foot wound  DISPOSITION OF SPECIMEN:  micro  COUNTS:  YES  TOURNIQUET:  * No tourniquets in log *  DICTATION: .Dragon Dictation  PLAN OF CARE: Admit to inpatient   PATIENT DISPOSITION:  PACU - hemodynamically stable.

## 2019-11-23 NOTE — Anesthesia Preprocedure Evaluation (Addendum)
Anesthesia Evaluation  Patient identified by [name], date of birth, ID band Patient awake    Reviewed: Allergy & Precautions, NPO status , Patient's Chart, lab work & pertinent test results  Airway Mallampati: II  TM Distance: >3 FB Neck ROM: Full    Dental  (+) Teeth Intact, Dental Advisory Given   Pulmonary sleep apnea , former smoker,    Pulmonary exam normal breath sounds clear to auscultation       Cardiovascular hypertension,  Rhythm:Regular Rate:Tachycardia     Neuro/Psych PSYCHIATRIC DISORDERS Depression  Neuromuscular disease    GI/Hepatic Neg liver ROS, GERD  Medicated,  Endo/Other  diabetes, Insulin Dependent  Renal/GU Renal InsufficiencyRenal disease     Musculoskeletal  (+) Arthritis , LEFT FOOT ABCESS   Abdominal   Peds  Hematology  (+) Blood dyscrasia (Thrombocytopenia), anemia ,   Anesthesia Other Findings   Reproductive/Obstetrics                            Anesthesia Physical Anesthesia Plan  ASA: III  Anesthesia Plan: Regional and MAC   Post-op Pain Management:    Induction: Intravenous  PONV Risk Score and Plan: 2 and Ondansetron and Propofol infusion  Airway Management Planned: Natural Airway and Nasal Cannula  Additional Equipment:   Intra-op Plan:   Post-operative Plan:   Informed Consent: I have reviewed the patients History and Physical, chart, labs and discussed the procedure including the risks, benefits and alternatives for the proposed anesthesia with the patient or authorized representative who has indicated his/her understanding and acceptance.       Plan Discussed with: CRNA  Anesthesia Plan Comments:      Anesthesia Quick Evaluation

## 2019-11-23 NOTE — Op Note (Signed)
Operative report  Preoperative diagnosis: Left plantar foot abscess  Postoperative diagnosis: Same  Operative procedure: I&D of left plantar foot abscess with application of wound VAC  Complications: None  Indications: Is a very pleasant 74 year old gentleman who was recently diagnosed with small cell lung CA who was noted to have a diabetic foot ulcer.  This is been treated conservatively at the wound care clinic but it progressed to the point where he presented to the emergency room yesterday.  He was noted to be diaphoretic, febrile, and admitted under the sepsis protocol.  I was consulted today for further evaluation and treatment.  Because of his pre-existing immunocompromise state due to his chemotherapy and lung CA we elected to move forward with a formal I&D of the wound.  All appropriate risks benefits and alternatives to surgery were discussed.  Operative report patient was brought the operating room and placed supine the operating room table.  After induction of a regional block the left foot was prepped and draped in a standard fashion.  Timeout was taken to confirm patient procedure and all other important data.  10 blade scalpel was used to debride the ulcerative tissue down to bleeding healthy tissue.  There was no deep abscess that could be palpated.  Cultures were taken and sent for appropriate analysis.  I then irrigated the wound copiously with 3 L of pulse lavage.  The sponge for the [facility] was then cut and placed on the plantar surface and wrapped onto the dorsum of the foot and then sealed.  I made sure that we do not have a circumferential seal over the entire foot.  It was then connected to the wound VAC and it was functioning.  The patient was ultimately transferred to the PACU without incident.  The end of the case all needle sponge counts were correct.

## 2019-11-23 NOTE — Progress Notes (Signed)
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 It appears he presented to the ED 11/15/2019 syncope x2 had a work-up with negative PE work-up orthostatics were positive he was given IV fluids and told to go home without his blood pressure meds  Presented to the [facility] ED 11/22/2019 fever, chills tachycardic 150s, febrile 101.8 Lactic acid 3 White count 0.9  Assessment & Plan:   Active Problems:   Hypertriglyceridemia   Essential hypertension   Chronic kidney disease, stage III (moderate)   Uncontrolled type 2 diabetes mellitus with peripheral neuropathy (HCC)   Small cell lung cancer (HCC)   Severe sepsis (HCC)   Neutropenic fever ([place])   Acute renal failure superimposed on stage 3a chronic kidney disease ([place])   1. Severe sepsis (lactic acidosis AKI on admission ) 2/2 acute osteomyelitis with worsening wound a. Bolused 3 L in ED continue high-dose fluids as below b. MRI pending, ABIs 8/13 show good flow c. Will consult Ortho for opinion about leg and possible intervention d. Continue vancomycin cefepime at this time follow-up blood urine cultures from 8/12 2. NSCLC with pancytopenia likely from infection and chemo a. Oncologist Mohammed added to treatment team b. Continue the neutropenic precautions today recheck [place] a.m. and monitor trends c. Granix to be given for 3 doses ending 8/15 3. AKI on admission-baseline 1.3 a. Continue saline 150 cc/h today  4. HTN a. Not on current blood pressure meds however continue Cardura in a.m. if needed 5. DM TY 2 A1c  10 indicating poor control-on 80-90?- U500 a. Will order U500 as 25-15-25 as per DM coord b. Will probably need retinopathy and other screening 6. Asthma 7. Dementia  DVT prophylaxis: lovenox Code Status: full Family Communication: none + Disposition:   Status is: Inpatient  Remains inpatient appropriate because:Ongoing active pain requiring inpatient pain management, Ongoing diagnostic testing needed not appropriate for outpatient work up, Unsafe d/c plan and IV treatments appropriate due to intensity of illness or inability to take PO   Dispo: The patient is from: Home              Anticipated d/c is to: SNF              Anticipated d/c date is: 2 days              Patient currently is not medically stable to d/c.       Consultants:   Ortho -Dr. Rolena Infante  Procedures: MRI/ABI 8/13  Antimicrobials: Vanc Cefepime    Subjective: Fair Tells me wound have been worsening since [place] this month with some oozing No cp No cough, no cold no sputum  Objective: Vitals:   11/23/19 0300 11/23/19 0401 11/23/19 0434 11/23/19 0600  BP: 136/66 140/69  136/68  Pulse: (!) 114 (!) 118  (!) 111  Resp: 14 19    Temp:  98 F (36.7 C)  98.4 F (36.9 C)  TempSrc:  Oral  Oral  SpO2: 96% 100%  97%  Weight:   83.6 kg   Height:   5\' 11"  (1.803 m)     Intake/Output Summary (Last 24 hours) at  11/23/2019 0749 Last data filed at 11/23/2019 2229 Gross per 24 hour  Intake 1450 ml  Output 4 ml  Net 1446 ml   Filed Weights   11/22/19 2038 11/23/19 0434  Weight: 95.7 kg 83.6 kg    Examination:  General exam: young appearing wm, mod dentition Respiratory system: cta b no added sound Cardiovascular system: s1 s2 no m/r/g Gastrointestinal system: soft nt nd . Central nervous system: intact--sensory intact LE  Extremities: wound L foot reviewed-see pictures from 8/12 Skin: as above Psychiatry:  pleasant  Data Reviewed: I have personally reviewed following labs and imaging  studies  WBC 0.9 Hb 9.8-->8.0 PLT 54-->34 Na 130-->132  Creat still 1.7  Radiology Studies: DG Chest 2 View  Result Date: 11/22/2019 CLINICAL DATA:  Suspected sepsis EXAM: CHEST - 2 VIEW COMPARISON:  10/12/2019, CT 11/15/2019 FINDINGS: No focal consolidation or pleural effusion. Normal heart size. Decreased right hilar mass. No pneumothorax. IMPRESSION: No acute airspace disease. Interval decrease in size of right hilar mass Electronically Signed   By: Donavan Foil M.D.   On: 11/22/2019 21:07   DG Foot Complete Left  Result Date: 11/22/2019 CLINICAL DATA:  Foot pain EXAM: LEFT FOOT - COMPLETE 3+ VIEW COMPARISON:  02/12/2019 FINDINGS: No acute displaced fracture or malalignment. Soft tissue ulceration at the mid sole of the foot. No radiopaque foreign body. Small plantar calcaneal spur. No osseous destructive change. Mild degenerative change at the first MTP joint. IMPRESSION: No acute osseous abnormality. Soft tissue ulceration at the mid sole of the foot. Electronically Signed   By: Donavan Foil M.D.   On: 11/22/2019 21:38     Scheduled Meds: . donepezil  10 mg Oral QHS  . insulin aspart  0-15 Units Subcutaneous TID WC  . insulin aspart  0-5 Units Subcutaneous QHS  . memantine  10 mg Oral BID  . montelukast  10 mg Oral QHS  . pantoprazole  40 mg Oral Daily  . simvastatin  40 mg Oral q1800  . sucralfate  1 g Oral QID  . venlafaxine XR  75 mg Oral BID   Continuous Infusions: . sodium chloride 150 mL/hr at 11/23/19 0121  . ceFEPime (MAXIPIME) IV    . metronidazole 500 mg (11/23/19 0615)  . [START ON 11/24/2019] vancomycin       LOS: 1 day    Time spent: [place], MD [facility] To contact the attending provider between 7A-7P or the covering provider during after hours 7P-7A, please log into the web site [email or web address] and access using universal [place] password for that web site. If you do not have the password, please call the hospital  operator.  11/23/2019, 7:49 AM

## 2019-11-24 ENCOUNTER — Encounter (HOSPITAL_COMMUNITY): Payer: Self-pay | Admitting: Orthopedic Surgery

## 2019-11-24 DIAGNOSIS — S91302D Unspecified open wound, left foot, subsequent encounter: Secondary | ICD-10-CM | POA: Diagnosis not present

## 2019-11-24 DIAGNOSIS — R509 Fever, unspecified: Secondary | ICD-10-CM | POA: Diagnosis not present

## 2019-11-24 DIAGNOSIS — D72819 Decreased white blood cell count, unspecified: Secondary | ICD-10-CM

## 2019-11-24 LAB — COMPREHENSIVE METABOLIC PANEL
ALT: 14 U/L (ref 0–44)
AST: 16 U/L (ref 15–41)
Albumin: 2.3 g/dL — ABNORMAL LOW (ref 3.5–5.0)
Alkaline Phosphatase: 66 U/L (ref 38–126)
Anion gap: 8 (ref 5–15)
BUN: 15 mg/dL (ref 8–23)
CO2: 23 mmol/L (ref 22–32)
Calcium: 7.3 mg/dL — ABNORMAL LOW (ref 8.9–10.3)
Chloride: 93 mmol/L — ABNORMAL LOW (ref 98–111)
Creatinine, Ser: 1.24 mg/dL (ref 0.61–1.24)
GFR calc Af Amer: >60 mL/min (ref >60)
GFR calc non Af Amer: 57 mL/min — ABNORMAL LOW (ref >60)
Glucose, Bld: 259 mg/dL — ABNORMAL HIGH (ref 70–99)
Potassium: 2.8 mmol/L — ABNORMAL LOW (ref 3.5–5.1)
Sodium: 124 mmol/L — ABNORMAL LOW (ref 135–145)
Total Bilirubin: 0.5 mg/dL (ref 0.3–1.2)
Total Protein: 5.2 g/dL — ABNORMAL LOW (ref 6.5–8.1)

## 2019-11-24 LAB — CBC WITH DIFFERENTIAL/PLATELET
Abs Immature Granulocytes: 0.18 10*3/uL — ABNORMAL HIGH (ref 0.00–0.07)
Basophils Absolute: 0 10*3/uL (ref 0.0–0.1)
Basophils Relative: 1 %
Eosinophils Absolute: 0 10*3/uL (ref 0.0–0.5)
Eosinophils Relative: 1 %
HCT: 20.5 % — ABNORMAL LOW (ref 39.0–52.0)
Hemoglobin: 7 g/dL — ABNORMAL LOW (ref 13.0–17.0)
Immature Granulocytes: 11 %
Lymphocytes Relative: 5 %
Lymphs Abs: 0.1 10*3/uL — ABNORMAL LOW (ref 0.7–4.0)
MCH: 27.7 pg (ref 26.0–34.0)
MCHC: 34.1 g/dL (ref 30.0–36.0)
MCV: 81 fL (ref 80.0–100.0)
Monocytes Absolute: 0.3 10*3/uL (ref 0.1–1.0)
Monocytes Relative: 15 %
Neutro Abs: 1.2 10*3/uL — ABNORMAL LOW (ref 1.7–7.7)
Neutrophils Relative %: 67 %
Platelets: 31 10*3/uL — ABNORMAL LOW (ref 150–400)
RBC: 2.53 MIL/uL — ABNORMAL LOW (ref 4.22–5.81)
RDW: 13.1 % (ref 11.5–15.5)
WBC: 1.7 10*3/uL — ABNORMAL LOW (ref 4.0–10.5)
nRBC: 0 % (ref 0.0–0.2)

## 2019-11-24 LAB — GLUCOSE, CAPILLARY
Glucose-Capillary: 219 mg/dL — ABNORMAL HIGH (ref 70–99)
Glucose-Capillary: 277 mg/dL — ABNORMAL HIGH (ref 70–99)
Glucose-Capillary: 43 mg/dL — CL (ref 70–99)
Glucose-Capillary: 58 mg/dL — ABNORMAL LOW (ref 70–99)
Glucose-Capillary: 123 mg/dL — ABNORMAL HIGH (ref 70–99)
Glucose-Capillary: 233 mg/dL — ABNORMAL HIGH (ref 70–99)

## 2019-11-24 LAB — SODIUM, URINE, RANDOM: Sodium, Ur: 66 mmol/L

## 2019-11-24 LAB — HEMOGLOBIN AND HEMATOCRIT, BLOOD
HCT: 23.8 % — ABNORMAL LOW (ref 39.0–52.0)
Hemoglobin: 8.3 g/dL — ABNORMAL LOW (ref 13.0–17.0)

## 2019-11-24 LAB — OSMOLALITY, URINE: Osmolality, Ur: 389 mOsm/kg (ref 300–900)

## 2019-11-24 LAB — MAGNESIUM: Magnesium: 1.4 mg/dL — ABNORMAL LOW (ref 1.7–2.4)

## 2019-11-24 LAB — PREPARE RBC (CROSSMATCH)

## 2019-11-24 MED ORDER — SODIUM CHLORIDE 0.9 % IV SOLN: INTRAVENOUS | Status: DC

## 2019-11-24 MED ORDER — SODIUM CHLORIDE 0.9% IV SOLUTION: Freq: Once | INTRAVENOUS | Status: DC

## 2019-11-24 MED ORDER — INSULIN REGULAR HUMAN (CONC) 500 UNIT/ML ~~LOC~~ SOPN
15.0000 [IU] | PEN_INJECTOR | Freq: Three times a day (TID) | SUBCUTANEOUS | Status: DC
  Administered 2019-11-24: 25 [IU] via SUBCUTANEOUS
  Filled 2019-11-24: qty 3

## 2019-11-24 MED ORDER — FUROSEMIDE 10 MG/ML IJ SOLN
20.0000 mg | Freq: Once | INTRAMUSCULAR | Status: AC
  Administered 2019-11-24: 20 mg via INTRAVENOUS
  Filled 2019-11-24: qty 2

## 2019-11-24 MED ORDER — DEXTROSE 50 % IV SOLN
1.0000 | Freq: Once | INTRAVENOUS | Status: AC
  Administered 2019-11-24: 50 mL via INTRAVENOUS
  Filled 2019-11-24: qty 50

## 2019-11-24 MED ORDER — MELATONIN 3 MG PO TABS
3.0000 mg | ORAL_TABLET | Freq: Every day | ORAL | Status: DC
  Administered 2019-11-24 – 2019-11-27 (×4): 3 mg via ORAL
  Filled 2019-11-24 (×4): qty 1

## 2019-11-24 MED ORDER — VANCOMYCIN HCL 1500 MG/300ML IV SOLN: 1500.0000 mg | INTRAVENOUS | Status: DC

## 2019-11-24 MED ORDER — INSULIN REGULAR HUMAN (CONC) 500 UNIT/ML ~~LOC~~ SOPN
25.0000 [IU] | PEN_INJECTOR | Freq: Three times a day (TID) | SUBCUTANEOUS | Status: DC
  Filled 2019-11-24: qty 3

## 2019-11-24 MED ORDER — POTASSIUM CHLORIDE IN NACL 40-0.9 MEQ/L-% IV SOLN
INTRAVENOUS | Status: DC
  Filled 2019-11-24 (×3): qty 1000

## 2019-11-24 MED ORDER — METOPROLOL SUCCINATE ER 25 MG PO TB24
12.5000 mg | ORAL_TABLET | Freq: Every day | ORAL | Status: DC
  Administered 2019-11-24 – 2019-11-28 (×5): 12.5 mg via ORAL
  Filled 2019-11-24 (×5): qty 1

## 2019-11-24 MED ORDER — INSULIN REGULAR HUMAN (CONC) 500 UNIT/ML ~~LOC~~ SOPN
15.0000 [IU] | PEN_INJECTOR | Freq: Three times a day (TID) | SUBCUTANEOUS | Status: DC
  Administered 2019-11-25 – 2019-11-28 (×11): 15 [IU] via SUBCUTANEOUS

## 2019-11-24 MED ORDER — DIPHENHYDRAMINE HCL 25 MG PO CAPS
25.0000 mg | ORAL_CAPSULE | Freq: Once | ORAL | Status: AC
  Administered 2019-11-24: 25 mg via ORAL
  Filled 2019-11-24: qty 1

## 2019-11-24 MED ORDER — MAGNESIUM SULFATE 2 GM/50ML IV SOLN
2.0000 g | Freq: Once | INTRAVENOUS | Status: AC
  Administered 2019-11-24: 2 g via INTRAVENOUS
  Filled 2019-11-24: qty 50

## 2019-11-24 NOTE — Progress Notes (Signed)
Hypoglycemic Event  CBG: 43  Treatment: 8 oz juice/soda  Symptoms: None  Follow-up CBG: Time:1627 CBG Result: 58   Possible Reasons for Event: Medication regimen: Humulin dose adjusted per pt request. MD aware.  Comments/MD notified: MD Samtani   CBG: 58  Treatment: Amp D50  Symptoms: None  Follow-up CBG: Time: 1708 CBG Result: 123  Possible Reasons for Event: Medication regimen: Humulin dose adjusted per pt request. MD aware.  Comments/MD notified: MD Samtani. Order for [place]

## 2019-11-24 NOTE — Consult Note (Addendum)
[facility] Disease       Reason for Consult: wound infection    Referring Physician: Dr. Verlon Au  Active Problems:   Hypertriglyceridemia   Essential hypertension   Chronic kidney disease, stage III (moderate)   Uncontrolled type 2 diabetes mellitus with peripheral neuropathy (HCC)   Small cell lung cancer (HCC)   Severe sepsis (HCC)   Neutropenic fever (HCC)   Acute renal failure superimposed on stage 3a chronic kidney disease (HCC)   Foot abscess, left    sodium chloride   Intravenous Once   donepezil  10 mg Oral QHS   furosemide  20 mg Intravenous Once   insulin aspart  0-5 Units Subcutaneous QHS   insulin regular human CONCENTRATED  15 Units Subcutaneous TID WC   loperamide  4 mg Oral BID   melatonin  3 mg Oral QHS   memantine  10 mg Oral BID   metoprolol succinate  12.5 mg Oral Daily   montelukast  10 mg Oral QHS   pantoprazole  40 mg Oral Daily   simvastatin  40 mg Oral q1800   sucralfate  1 g Oral QID   Tbo-filgastrim (GRANIX) SQ  300 mcg Subcutaneous q1800   venlafaxine XR  75 mg Oral BID    Recommendations: cipro and amoxicillin at discharge to complete 5 more days total.  I will stop vancomycin  Assessment: He has had a wound on his foot that has been on going for about 5 months and was healing well but presented with fever to 101.0 and leukopenia in the setting of his lung cancer treatment.  Some necrotic tissue noted.  Blood cultures have remained negative.  He had no other symptoms except his foot with thick, bloody discharge.  The OP report noted no abscess, no deep infection and only required debridement of necrotic tissue and VAC then placed.  MRI also done and no concerns for deep infection or osteomyelitis.  Erythema of the skin noted and appears c/w skin irritation, no cellulitis. I do not see any indication for long term or IV antibiotics.  No WBCs noted on the gram stain and no organisms and no pus described so  appears to be non-infectious.  You could treat based on his previous cultures with cipro 500 mg twice a day for the Pseudomonas and amoxicillin 500 mg TID for 3-4 more days (can use cefepime while inpatient).  He had an elevated lactate on admission with prerenal azotemia. No current signs of symptoms of sepsis/SIRS.    Antibiotics: Vancomycin and cefepime  HPI: [name] is a 74 y.o. male with a history of small cell lung cancer, CKD, poorly controlled DM with a Hgb A1C of 10 who came in on 8/12 with fever and chills.  He had noted some bloody drainage and was having no other symptoms including no sob, no cough, no diarrhea, no dysuria. Febrile to 101.8 in the ED but has remained afebrile since.  Has leukopenia in the setting of chemotherapy with carboplatin and etoposide plus radiation.     Review of Systems:  Constitutional: negative for fevers and chills Gastrointestinal: negative for nausea All other systems reviewed and are negative    Past Medical History:  Diagnosis Date   Allergic rhinitis due to pollen    Cancer [facility])    Chronic kidney disease, stage III (moderate) 12/19/2012   Depression    Diabetes mellitus ([place])    Diverticulosis    Erectile dysfunction associated with type  2 diabetes mellitus ([place]) 05/28/2015   Former very heavy cigarette smoker (more than 40 per day) 10/07/2014   GERD (gastroesophageal reflux disease)    Gout    Hyperlipidemia    Hypertension    Hypogonadism male 06/14/2012   Iron deficiency    Memory loss    Osteoarthritis    Personal history of colonic polyps    1996    Social History   Tobacco Use   Smoking status: Former Smoker    Packs/day: 3.00    Years: 35.00    Pack years: 105.00    Types: Cigarettes    Quit date: 04/12/2000    Years since quitting: 19.6   Smokeless tobacco: Never Used  Scientific laboratory technician Use: Never used  Substance Use Topics   Alcohol use: No   Drug use: No    Family History  Problem  Relation Age of Onset   Diabetes Mother    Alzheimer's disease Mother    Diabetes Father    Lung cancer Father        Age 79   Stroke Father    Heart attack Father    Lung cancer Sister        lung   Heart disease Maternal Grandfather    COPD Sister    Heart attack Sister    Heart disease Sister     Allergies  Allergen Reactions   Tetracycline Itching and Rash    Physical Exam: Constitutional: in no apparent distress  Vitals:   11/24/19 1514 11/24/19 1545  BP: 136/72 134/72  Pulse: 99 99  Resp: 18 18  Temp: (!) 97.5 F (36.4 C) 98.4 F (36.9 C)  SpO2: 98% 96%   EYES: anicteric ENMT: no thrush Cardiovascular: Cor RRR Respiratory: clear; Musculoskeletal: no pedal edema noted; VAC in place.  Some area of erythema c/w skin irritation Skin: negatives: no rash Neuro: non-focal  Lab Results  Component Value Date   WBC 1.7 (L) 11/24/2019   HGB 7.0 (L) 11/24/2019   HCT 20.5 (L) 11/24/2019   MCV 81.0 11/24/2019   PLT 31 (L) 11/24/2019    Lab Results  Component Value Date   CREATININE 1.24 11/24/2019   BUN 15 11/24/2019   NA 124 (L) 11/24/2019   K 2.8 (L) 11/24/2019   CL 93 (L) 11/24/2019   CO2 23 11/24/2019    Lab Results  Component Value Date   ALT 14 11/24/2019   AST 16 11/24/2019   ALKPHOS 66 11/24/2019     Microbiology: Recent Results (from the past 240 hour(s))  Culture, blood (Routine x 2)     Status: None (Preliminary result)   Collection Time: 11/22/19  9:14 PM   Specimen: BLOOD  Result Value Ref Range Status   Specimen Description   Final    BLOOD RIGHT ANTECUBITAL Performed at [facility], [place] [address], [place], [place] [address]    Special Requests   Final    BOTTLES DRAWN AEROBIC AND ANAEROBIC Blood Culture results may not be optimal due to an excessive volume of blood received in culture bottles Performed at [place] [address], [place], [place] [address]    Culture   Final      NO GROWTH 2 DAYS Performed at [facility] [address], [place], [place] [address]    Report Status PENDING  Incomplete  SARS Coronavirus 2 by RT PCR (hospital order, performed in [facility] hospital lab) Nasopharyngeal Nasopharyngeal  Swab     Status: None   Collection Time: 11/22/19 10:35 PM   Specimen: Nasopharyngeal Swab  Result Value Ref Range Status   SARS Coronavirus 2 NEGATIVE NEGATIVE Final    Comment: (NOTE) SARS-CoV-2 target nucleic acids are NOT DETECTED.  The SARS-CoV-2 RNA is generally detectable in upper and lower respiratory specimens during the acute phase of infection. The lowest concentration of SARS-CoV-2 viral copies this assay can detect is 250 copies / mL. A negative result does not preclude SARS-CoV-2 infection and should not be used as the sole basis for treatment or other patient management decisions.  A negative result may occur with improper specimen collection / handling, submission of specimen other than nasopharyngeal swab, presence of viral mutation(s) within the areas targeted by this assay, and inadequate number of viral copies (<250 copies / mL). A negative result must be combined with clinical observations, patient history, and epidemiological information.  Fact Sheet for Patients:   StrictlyIdeas.no  Fact Sheet for Healthcare Providers: BankingDealers.co.za  This test is not yet approved or  cleared by the [place] FDA and has been authorized for detection and/or diagnosis of SARS-CoV-2 by FDA under an Emergency Use Authorization (EUA).  This EUA will remain in effect (meaning this test can be used) for the duration of the COVID-19 declaration under Section 564(b)(1) of the Act, 21 U.S.C. section 360bbb-3(b)(1), unless the authorization is terminated or revoked sooner.  Performed at [facility], [place] [address] [place], [place] [address]   Culture, blood  (Routine x 2)     Status: None (Preliminary result)   Collection Time: 11/23/19  2:33 AM   Specimen: BLOOD  Result Value Ref Range Status   Specimen Description   Final    BLOOD LEFT ANTECUBITAL Performed at [place] [address], [place], [place] [address]    Special Requests   Final    BOTTLES DRAWN AEROBIC AND ANAEROBIC Blood Culture results may not be optimal due to an excessive volume of blood received in culture bottles Performed at [facility] [address] [place], [place] [address]    Culture   Final    NO GROWTH 1 DAY Performed at [facility], [place] [address] [place], [place] [address]    Report Status PENDING  Incomplete  Urine culture     Status: None   Collection Time: 11/23/19  2:33 AM   Specimen: In/Out Cath Urine  Result Value Ref Range Status   Specimen Description   Final    IN/OUT CATH URINE Performed at [facility] [address], [place], [place] [address]    Special Requests   Final    NONE Performed at [facility], [place] [address] [place], [place] [address]    Culture   Final    NO GROWTH Performed at [facility], [place] [address], [place], [place] [address]    Report Status 11/23/2019 FINAL  Final  Aerobic/Anaerobic Culture (surgical/deep wound)     Status: None (Preliminary result)   Collection Time: 11/23/19  6:00 PM   Specimen: Abscess  Result Value Ref Range Status   Specimen Description ABSCESS LEFT FOOT  Final   Special Requests NONE  Final   Gram Stain NO WBC SEEN NO ORGANISMS SEEN   Final   Culture   Final    NO GROWTH < 24 HOURS Performed at [facility], [place] [address] [place], [place]  [address]    Report Status PENDING  Incomplete    Thayer Headings, [facility] for Infectious Disease [facility] [email or web address] 11/24/2019, 5:37 PM

## 2019-11-24 NOTE — Progress Notes (Signed)
   11/24/19 0424  Assess: MEWS Score  Temp 98.1 F (36.7 C)  BP (!) 166/76  Pulse Rate (!) 120  Resp 20  SpO2 95 %  Assess: MEWS Score  MEWS Temp 0  MEWS Systolic 0  MEWS Pulse 2  MEWS RR 0  MEWS LOC 0  MEWS Score 2  MEWS Score Color Yellow  Assess: if the MEWS score is Yellow or Red  Were vital signs taken at a resting state? Yes  Focused Assessment Change from prior assessment (see assessment flowsheet)  Early Detection of Sepsis Score *See Row Information* Medium  MEWS guidelines implemented *See Row Information* No, previously yellow, continue vital signs every 4 hours  Treat  MEWS Interventions Administered prn meds/treatments  Pain Scale 0-10  Pain Score 4  Pain Intervention(s) Medication (See eMAR)  Take Vital Signs  Increase Vital Sign Frequency  Yellow: Q 2hr X 2 then Q 4hr X 2, if remains yellow, continue Q 4hrs  Escalate  MEWS: Escalate Yellow: discuss with charge nurse/RN and consider discussing with provider and RRT  Notify: Charge Nurse/RN  Name of Charge Nurse/RN Notified Pam H.  Date Charge Nurse/RN Notified 11/24/19  Time Charge Nurse/RN Notified 0425

## 2019-11-24 NOTE — Progress Notes (Signed)
   11/24/19 0217  Assess: MEWS Score  Temp 98.1 F (36.7 C)  BP (!) 169/76  Pulse Rate (!) 125  Resp 20  SpO2 96 %  Assess: MEWS Score  MEWS Temp 0  MEWS Systolic 0  MEWS Pulse 2  MEWS RR 0  MEWS LOC 0  MEWS Score 2  MEWS Score Color Yellow  Assess: if the MEWS score is Yellow or Red  Were vital signs taken at a resting state? Yes  Focused Assessment No change from prior assessment  Early Detection of Sepsis Score *See Row Information* Medium  MEWS guidelines implemented *See Row Information* Yes  Treat  MEWS Interventions Escalated (See documentation below)  Pain Scale 0-10  Pain Score 0  Take Vital Signs  Increase Vital Sign Frequency  Yellow: Q 2hr X 2 then Q 4hr X 2, if remains yellow, continue Q 4hrs  Escalate  MEWS: Escalate Yellow: discuss with charge nurse/RN and consider discussing with provider and RRT  Notify: Charge Nurse/RN  Name of Charge Nurse/RN Notified Pam H.  Date Charge Nurse/RN Notified 11/24/19  Time Charge Nurse/RN Notified 0300

## 2019-11-24 NOTE — Progress Notes (Signed)
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  It appears he presented to the ED 11/15/2019 syncope x2 had a work-up with negative PE work-up orthostatics were positive he was given IV fluids and told to go home without his blood pressure meds and also had been seen recently at oncology office but did not mention worsening wounds in his left leg  Presented to the [facility] ED 11/22/2019 fever, chills tachycardic 150s, febrile 101.8 and found to have lower left extremity wound Lactic acid 3 White count 0.9  Orthopedics Dr. Rolena Infante saw the patient in consult after MRI and ABIs and elected to perform I&D   Assessment & Plan:   Active Problems:   Hypertriglyceridemia   Essential hypertension   Chronic kidney disease, stage III (moderate)   Uncontrolled type 2 diabetes mellitus with peripheral neuropathy (HCC)   Small cell lung cancer (HCC)   Severe sepsis (HCC)   Neutropenic fever ([place])   Acute renal failure superimposed on stage 3a chronic kidney disease (HCC)   Foot abscess, left   1. Severe sepsis (lactic acidosis AKI on admission ) 2/2 acute severe ulcers as well as infectious process with cellulitis of the left foot-underwent debridement 11/23/2019 of wound and will probably need definitive care going forward a. Bolused 3 L in ED continue high-dose fluids as below b. Appreciate orthopedics input and will probably need further input from Dr. Sharol Given later this week c. D/W Dr. Patsi Sears ER ID-?  Suspect will need prolonged course given the viability of tissue poor healing  in the setting of cancer and may need a PICC line? d. Has a wound VAC in place-apparently wound will be reviewed on Monday by them and we can make final decisions about antibiotics at that time 2. Likely hypervolemic hyponatremia with severe hypokalemia a. Drinking copiously, urinary sodium is 66 osmolality is pending b. Have encouraged him not to drink as much-do not think it is SIADH c. Continuing saline 100 cc/h d. Adding to the fluids potassium 40 e. Magnesium is low therefore replace IV 2 g and repeat labs in a.m. 3. NSCLC with pancytopenia likely from infection and chemo 4. Expected blood loss anemia from surgery and hemodilution a. ANC is elevated do not need neutropenic precautions b. Granix ordered ending 8/15 c. We will transfuse 1 unit PRBC 8/14 5. AKI on admission-baseline 1.3 a. See above discussion-this is resolving slowly 6. HTN 7. Persistent sinus tachycardia per patient since his cancer diagnosis a. Holding Cardura b. Added 8/14 low-dose metoprolol XL 12.5 and titrate 8. DM TY 2 A1c 10 indicating poor control-on 80-90?- U500 a. Will order U500 at a lower dose as he is not eating well 15 15 15  b. Monitor glycemic trends-today 05/31/1957 but only eating 25% 9. OSA on CPAP a. Not clear refuses will order for tonight 10. Asthma 11. Dementia  DVT prophylaxis: lovenox Code Status: full Family Communication: Called the grandson Mr.  Eldridge Abrahams ETT at [phone]  Disposition:   Status is: Inpatient  Remains inpatient appropriate because:Ongoing active pain requiring inpatient pain management, Ongoing diagnostic testing needed not appropriate for outpatient work up, Unsafe d/c plan and  IV treatments appropriate due to intensity of illness or inability to take PO   Dispo: The patient is from: Home              Anticipated d/c is to: SNF              Anticipated d/c date is: 2 days              Patient currently is not medically stable to d/c.   Consultants:   Ortho -Dr.  Rolena Infante  Procedures: MRI/ABI 8/13 a. MRI did not show osteomyelitis bu severe ulceration foot fourth fifth metatarsal bases t and displaced fracture of fifth proximal phalanx b. ABIs 8/13 show good flow  Antimicrobials: Vanc Cefepime    Subjective: Looks well no pain at this time No fever No chill rigors  Objective: Vitals:   11/24/19 0217 11/24/19 0424 11/24/19 0629 11/24/19 1012  BP: (!) 169/76 (!) 166/76 (!) 141/74 123/66  Pulse: (!) 125 (!) 120 (!) 115 (!) 118  Resp: 20 20 (!) 21   Temp: 98.1 F (36.7 C) 98.1 F (36.7 C) 97.8 F (36.6 C) 98.4 F (36.9 C)  TempSrc: Oral Oral Oral Oral  SpO2: 96% 95% 97% 100%  Weight:      Height:        Intake/Output Summary (Last 24 hours) at 11/24/2019 1021 Last data filed at 11/24/2019 4166 Gross per 24 hour  Intake 2594.72 ml  Output 760 ml  Net 1834.72 ml   Filed Weights   11/22/19 2038 11/23/19 0434  Weight: 95.7 kg 83.6 kg    Examination:  General exam: younger appearing than age Respiratory system: cta b no added sound Cardiovascular system: s1 s2 no m/r/g Gastrointestinal system: soft nt nd Central nervous system: intact--sensory intact LE  Extremities: wound L foot now has a VAC on it Psychiatry:  pleasant  Data Reviewed: I have personally reviewed following labs and imaging studies  WBC 0.9-->1.7 Hb 9.8-->8.0-->7.0 giving 1 u prbc PLT 54-->34 Na 130-->132  Creat still 1.7  Radiology Studies: DG Chest 2 View  Result Date: 11/22/2019 CLINICAL DATA:  Suspected sepsis EXAM: CHEST - 2 VIEW COMPARISON:  10/12/2019, CT 11/15/2019 FINDINGS: No focal consolidation or pleural effusion. Normal heart size. Decreased right hilar mass. No pneumothorax. IMPRESSION: No acute airspace disease. Interval decrease in size of right hilar mass Electronically Signed   By: Donavan Foil M.D.   On: 11/22/2019 21:07   MR FOOT LEFT W WO CONTRAST  Result Date: 11/23/2019 CLINICAL DATA:  Chronic left foot ulcer with erythema and  recent drainage. History of lung cancer. Concern for osteomyelitis. EXAM: MRI OF THE LEFT FOREFOOT WITHOUT AND WITH CONTRAST TECHNIQUE: Multiplanar, multisequence MR imaging of the left forefoot was performed both before and after administration of intravenous contrast. CONTRAST:  37mL GADAVIST GADOBUTROL 1 MMOL/ML IV SOLN COMPARISON:  Radiographs 11/22/2019 and 02/12/2019 FINDINGS: Bones/Joint/Cartilage There is soft tissue ulceration along the plantar aspect of the midfoot at the level of the 4th and 5th metatarsal bases. There appears to be mild soft tissue ulceration along the plantar aspect of the 5th metatarsal head as well. No underlying cortical destruction, marrow edema or suspicious marrow enhancement. There are mild midfoot and 1st MTP degenerative changes without significant joint effusions. The tibial sesamoid of the 1st metatarsal is bipartite. The distal toes are incompletely visualized. Possible nondisplaced transverse fracture through the base of the 5th proximal phalanx. Ligaments Intact Lisfranc ligament. 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 No focal intramuscular fluid collection or tenosynovitis. The ankle tendons appear intact. Soft tissues As above, apparent soft tissue ulceration along the plantar aspect of the 4th and 5th metatarsal bases and the 5th metatarsal head. There is associated heterogeneous enhancement following contrast, but no evidence of drainable fluid collection or foreign body. IMPRESSION: 1. Soft tissue ulceration along the plantar aspect of the midfoot at the level of the 4th and 5th metatarsal bases and the 5th metatarsal head. No evidence of drainable fluid collection or foreign body. 2. No evidence of osteomyelitis. 3. Possible nondisplaced transverse fracture through the base of the 5th proximal phalanx. Electronically Signed   By: Richardean Sale M.D.   On: 11/23/2019 16:31   DG Foot Complete  Left  Result Date: 11/22/2019 CLINICAL DATA:  Foot pain EXAM: LEFT FOOT - COMPLETE 3+ VIEW COMPARISON:  02/12/2019 FINDINGS: No acute displaced fracture or malalignment. Soft tissue ulceration at the mid sole of the foot. No radiopaque foreign body. Small plantar calcaneal spur. No osseous destructive change. Mild degenerative change at the first MTP joint. IMPRESSION: No acute osseous abnormality. Soft tissue ulceration at the mid sole of the foot. Electronically Signed   By: Donavan Foil M.D.   On: 11/22/2019 21:38   VAS [place] ABI WITH/WO TBI  Result Date: 11/23/2019 LOWER EXTREMITY DOPPLER STUDY Indications: Ulceration of LT foot. High Risk Factors: Hypertension, Diabetes.  Comparison Study: No prior studies. Performing Technologist: Darlin Coco  Examination Guidelines: A complete evaluation includes at minimum, Doppler waveform signals and systolic blood pressure reading at the level of bilateral brachial, anterior tibial, and posterior tibial arteries, when vessel segments are accessible. Bilateral testing is considered an integral part of a complete examination. Photoelectric Plethysmograph (PPG) waveforms and toe systolic pressure readings are included as required and additional duplex testing as needed. Limited examinations for reoccurring indications may be performed as noted.  ABI Findings: +---------+------------------+-----+---------+--------+ Right    Rt Pressure (mmHg)IndexWaveform Comment  +---------+------------------+-----+---------+--------+ Brachial 154                    triphasic         +---------+------------------+-----+---------+--------+ PTA      161               1.05 triphasic         +---------+------------------+-----+---------+--------+ DP       143               0.93 triphasic         +---------+------------------+-----+---------+--------+ Great Toe100               0.65 Abnormal          +---------+------------------+-----+---------+--------+  +---------+------------------+-----+---------+-------+ Left     Lt Pressure (mmHg)IndexWaveform Comment +---------+------------------+-----+---------+-------+ Brachial 152                    triphasic        +---------+------------------+-----+---------+-------+ PTA      155               1.01 triphasic        +---------+------------------+-----+---------+-------+ DP       114               0.74 triphasic        +---------+------------------+-----+---------+-------+ Great Toe120               0.78 Normal           +---------+------------------+-----+---------+-------+ +-------+-----------+-----------+------------+------------+  ABI/TBIToday's ABIToday's TBIPrevious ABIPrevious TBI +-------+-----------+-----------+------------+------------+ Right  1.05       0.65                                +-------+-----------+-----------+------------+------------+ Left   1.01       0.78                                +-------+-----------+-----------+------------+------------+  Summary: Right: Resting right ankle-brachial index is within normal range. No evidence of significant right lower extremity arterial disease. The right toe-brachial index is mildly abnormal. Left: Resting left ankle-brachial index is within normal range. No evidence of significant left lower extremity arterial disease. The left toe-brachial index is normal.  *See table(s) above for measurements and observations.     Preliminary      Scheduled Meds: . donepezil  10 mg Oral QHS  . insulin aspart  0-5 Units Subcutaneous QHS  . insulin regular human CONCENTRATED  20 Units Subcutaneous TID WC  . loperamide  4 mg Oral BID  . memantine  10 mg Oral BID  . metoprolol succinate  12.5 mg Oral Daily  . montelukast  10 mg Oral QHS  . pantoprazole  40 mg Oral Daily  . simvastatin  40 mg Oral q1800  . sucralfate  1 g Oral QID  . Tbo-filgastrim (GRANIX) SQ  300 mcg Subcutaneous q1800  . venlafaxine XR  75 mg  Oral BID   Continuous Infusions: . 0.9 % NaCl with KCl 40 mEq / L Stopped (11/24/19 0951)  . ceFEPime (MAXIPIME) IV 2 g (11/24/19 0953)  . vancomycin       LOS: 2 days    Time spent: [place], MD [facility] To contact the attending provider between 7A-7P or the covering provider during after hours 7P-7A, please log into the web site [email or web address] and access using universal [place] password for that web site. If you do not have the password, please call the hospital operator.  11/24/2019, 10:21 AM

## 2019-11-24 NOTE — Progress Notes (Signed)
Pt requesting 25 units Humulin instead of ordered 15 units. MD Samtani made aware and authorized administration. Will continue to monitor patient.

## 2019-11-24 NOTE — Progress Notes (Signed)
Pt only wishes to receive 15 units Humulin, states he tends to "drop" at home if too much insulin received. Pt also only consumed 25% of breakfast. MD Samtain made aware. Will continue to monitor patient.

## 2019-11-24 NOTE — Progress Notes (Signed)
Pharmacy Antibiotic Note  [name] is a 74 y.o. male admitted on 11/22/2019 with sepsis.  Pharmacy has been consulted for Vancomycin, cefepime dosing.  11/24/2019  S/p I&D L foot abscess yesterday AF, WBC up to 1.7 w/ ANC 1.2 - on Granix per Onc SCr down to 1.24 but CKD3 w/ past SCr ranging 1.37-1.63 No positive micro data so far  Plan:  Adjust Vancomycin to 1500 mg IV q24 hours Goal AUC 400-550. Continue Cefepime 2gm IV q12hr F/u abx plans  Height: 5\' 11"  (180.3 cm) Weight: 83.6 kg (184 lb 4.8 oz) IBW/kg (Calculated) : 75.3  Temp (24hrs), Avg:98.1 F (36.7 C), Min:97.6 F (36.4 C), Max:98.4 F (36.9 C)  Recent Labs  Lab 11/19/19 1031 11/22/19 2115 11/23/19 0233 11/24/19 0515  WBC 3.3* 0.9* 0.9* 1.7*  CREATININE 1.37* 1.78* 1.71* 1.24  LATICACIDVEN  --  3.0* 1.0  --     Estimated Creatinine Clearance: 55.7 mL/min (by C-G formula based on SCr of 1.24 mg/dL).    Allergies  Allergen Reactions  . Tetracycline Itching and Rash    Antimicrobials this admission:  Zosyn 11/22/2019 x1 Vancomycin 11/22/2019 >> Cefepime 11/23/2019 >>  Flagyl 8/13>>8/13 2 doses Dose adjustments this admission:  8/14 V 1750 q36 to 1500 q24 Microbiology results:  8/13 BCx2: ngtd 8/13 UCx: ngF 8/13 L foot abscess: gram stain neg, ngtd  Thank you for allowing pharmacy to be a part of this patient's care.  Eudelia Bunch, Pharm.D 11/24/2019 1:31 PM

## 2019-11-24 NOTE — Progress Notes (Signed)
    Subjective:  Patient reports pain as mild.  Denies N/V/CP/SOB. He is resting comfortably in bed and states that his foot is still numb from the nerve block  Objective:   VITALS:   Vitals:   11/23/19 2113 11/24/19 0217 11/24/19 0424 11/24/19 0629  BP: 113/62 (!) 169/76 (!) 166/76 (!) 141/74  Pulse: 68 (!) 125 (!) 120 (!) 115  Resp: 16 20 20  (!) 21  Temp: 98.3 F (36.8 C) 98.1 F (36.7 C) 98.1 F (36.7 C) 97.8 F (36.6 C)  TempSrc:  Oral Oral Oral  SpO2: 96% 96% 95% 97%  Weight:      Height:        NAD ABD soft Neurovascular intact Intact pulses distally Wound vac sealed and funtioning properly   Lab Results  Component Value Date   WBC 1.7 (L) 11/24/2019   HGB 7.0 (L) 11/24/2019   HCT 20.5 (L) 11/24/2019   MCV 81.0 11/24/2019   PLT 31 (L) 11/24/2019   BMET    Component Value Date/Time   NA 124 (L) 11/24/2019 0515   NA 130 (L) 11/25/2011 1119   NA 137 11/16/2011 0513   K 2.8 (L) 11/24/2019 0515   K 4.1 11/16/2011 0513   CL 93 (L) 11/24/2019 0515   CL 100 11/16/2011 0513   CO2 23 11/24/2019 0515   CO2 29 11/16/2011 0513   GLUCOSE 259 (H) 11/24/2019 0515   GLUCOSE 239 (H) 11/16/2011 0513   BUN 15 11/24/2019 0515   BUN 25 11/25/2011 1119   BUN 15 11/16/2011 0513   CREATININE 1.24 11/24/2019 0515   CREATININE 1.37 (H) 11/19/2019 1031   CREATININE 1.26 11/16/2011 0513   CALCIUM 7.3 (L) 11/24/2019 0515   CALCIUM 9.1 11/16/2011 0513   GFRNONAA 57 (L) 11/24/2019 0515   GFRNONAA 50 (L) 11/19/2019 1031   GFRNONAA 59 (L) 11/16/2011 0513   GFRAA >60 11/24/2019 0515   GFRAA 58 (L) 11/19/2019 1031   GFRAA >60 11/16/2011 0513     Assessment/Plan: 1 Day Post-Op   Active Problems:   Hypertriglyceridemia   Essential hypertension   Chronic kidney disease, stage III (moderate)   Uncontrolled type 2 diabetes mellitus with peripheral neuropathy (HCC)   Small cell lung cancer (HCC)   Severe sepsis (HCC)   Neutropenic fever (HCC)   Acute renal failure  superimposed on stage 3a chronic kidney disease (HCC)   Foot abscess, left   Nonweight bearing  DVT ppx: Lovenox, SCDs, TEDS PO pain control Severe sepis: Per hospitalist Dispo: Per hospitalist recommendation    Dorothyann Peng 11/24/2019, 8:26 AM    [facility] is now [facility] [address], [place], [place] [address] Phone: [phone] [email or web address] Facebook  [employer]

## 2019-11-24 NOTE — Progress Notes (Signed)
   11/24/19 0629  Assess: MEWS Score  Temp 97.8 F (36.6 C)  BP (!) 141/74  Pulse Rate (!) 115  Resp (!) 21  SpO2 97 %  Assess: MEWS Score  MEWS Temp 0  MEWS Systolic 0  MEWS Pulse 2  MEWS RR 1  MEWS LOC 0  MEWS Score 3  MEWS Score Color Yellow  Assess: if the MEWS score is Yellow or Red  Were vital signs taken at a resting state? Yes  Focused Assessment Change from prior assessment (see assessment flowsheet) (Respirations slightly elevated)  Early Detection of Sepsis Score *See Row Information* Medium  MEWS guidelines implemented *See Row Information* No, previously yellow, continue vital signs every 4 hours  Treat  MEWS Interventions Other (Comment) (Continue q 4 hr vital signs)  Pain Scale 0-10  Pain Score 0  Take Vital Signs  Increase Vital Sign Frequency  Yellow: Q 2hr X 2 then Q 4hr X 2, if remains yellow, continue Q 4hrs  Escalate  MEWS: Escalate Yellow: discuss with charge nurse/RN and consider discussing with provider and RRT  Notify: Charge Nurse/RN  Name of Charge Nurse/RN Notified Pam H.  Date Charge Nurse/RN Notified 11/24/19  Time Charge Nurse/RN Notified [phone]

## 2019-11-25 LAB — COMPREHENSIVE METABOLIC PANEL
ALT: 16 U/L (ref 0–44)
AST: 15 U/L (ref 15–41)
Albumin: 2.9 g/dL — ABNORMAL LOW (ref 3.5–5.0)
Alkaline Phosphatase: 76 U/L (ref 38–126)
Anion gap: 11 (ref 5–15)
BUN: 11 mg/dL (ref 8–23)
CO2: 30 mmol/L (ref 22–32)
Calcium: 8.3 mg/dL — ABNORMAL LOW (ref 8.9–10.3)
Chloride: 96 mmol/L — ABNORMAL LOW (ref 98–111)
Creatinine, Ser: 1.37 mg/dL — ABNORMAL HIGH (ref 0.61–1.24)
GFR calc Af Amer: 58 mL/min — ABNORMAL LOW (ref >60)
GFR calc non Af Amer: 50 mL/min — ABNORMAL LOW (ref >60)
Glucose, Bld: 237 mg/dL — ABNORMAL HIGH (ref 70–99)
Potassium: 3.8 mmol/L (ref 3.5–5.1)
Sodium: 137 mmol/L (ref 135–145)
Total Bilirubin: 0.9 mg/dL (ref 0.3–1.2)
Total Protein: 6 g/dL — ABNORMAL LOW (ref 6.5–8.1)

## 2019-11-25 LAB — CBC WITH DIFFERENTIAL/PLATELET
Abs Immature Granulocytes: 0.89 10*3/uL — ABNORMAL HIGH (ref 0.00–0.07)
Basophils Absolute: 0.1 10*3/uL (ref 0.0–0.1)
Basophils Relative: 1 %
Eosinophils Absolute: 0 10*3/uL (ref 0.0–0.5)
Eosinophils Relative: 0 %
HCT: 26.8 % — ABNORMAL LOW (ref 39.0–52.0)
Hemoglobin: 9 g/dL — ABNORMAL LOW (ref 13.0–17.0)
Immature Granulocytes: 17 %
Lymphocytes Relative: 5 %
Lymphs Abs: 0.3 10*3/uL — ABNORMAL LOW (ref 0.7–4.0)
MCH: 27.7 pg (ref 26.0–34.0)
MCHC: 33.6 g/dL (ref 30.0–36.0)
MCV: 82.5 fL (ref 80.0–100.0)
Monocytes Absolute: 0.5 10*3/uL (ref 0.1–1.0)
Monocytes Relative: 10 %
Neutro Abs: 3.4 10*3/uL (ref 1.7–7.7)
Neutrophils Relative %: 67 %
Platelets: 43 10*3/uL — ABNORMAL LOW (ref 150–400)
RBC: 3.25 MIL/uL — ABNORMAL LOW (ref 4.22–5.81)
RDW: 13.5 % (ref 11.5–15.5)
WBC: 5.2 10*3/uL (ref 4.0–10.5)
nRBC: 0.4 % — ABNORMAL HIGH (ref 0.0–0.2)

## 2019-11-25 LAB — TYPE AND SCREEN
ABO/RH(D): A POS
Antibody Screen: NEGATIVE
Unit division: 0

## 2019-11-25 LAB — GLUCOSE, CAPILLARY
Glucose-Capillary: 229 mg/dL — ABNORMAL HIGH (ref 70–99)
Glucose-Capillary: 231 mg/dL — ABNORMAL HIGH (ref 70–99)
Glucose-Capillary: 108 mg/dL — ABNORMAL HIGH (ref 70–99)
Glucose-Capillary: 154 mg/dL — ABNORMAL HIGH (ref 70–99)

## 2019-11-25 LAB — BPAM RBC
Blood Product Expiration Date: 202109072359
ISSUE DATE / TIME: 202108141523
Unit Type and Rh: 6200

## 2019-11-25 LAB — MAGNESIUM: Magnesium: 1.5 mg/dL — ABNORMAL LOW (ref 1.7–2.4)

## 2019-11-25 MED ORDER — MAGNESIUM SULFATE 2 GM/50ML IV SOLN: 2.0000 g | Freq: Once | INTRAVENOUS | Status: DC

## 2019-11-25 MED ORDER — MAGNESIUM SULFATE 4 GM/100ML IV SOLN
4.0000 g | Freq: Once | INTRAVENOUS | Status: AC
  Administered 2019-11-25: 4 g via INTRAVENOUS
  Filled 2019-11-25: qty 100

## 2019-11-25 NOTE — Evaluation (Signed)
Occupational Therapy Evaluation Patient Details Name: [name] [name]: [id number] DOB: 01/23/1946 Today's Date: 11/25/2019    History of Present Illness [name] is a 74 y.o. male with medical history significant of small cell lung cancer, hypertension, CKD stage IIIa, type 2 diabetes mellitus, hyperlipidemia and chronic left foot ulcer presented to ED with a complaint of fever and chills. Admitted with sepsis secondary to foot ulcer. S/p I & D of L foot with wound vac placement.   Clinical Impression   [name] is a 74 year old man who presents s/p I &D of left foot with NWB status and wound vac placement resulting in decreased ability to perform baseline mobility and ADLs. Patient predominantly set up to min assist for all tasks except mod assist for toileting due to limitations with mobility and needing new DME. Patient will benefit from skilled OT services while in hospital to learn compensatory strategies for ADLs prior to discharge home.    Follow Up Recommendations  No OT follow up    Equipment Recommendations  None recommended by OT    Recommendations for Other Services       Precautions / Restrictions Precautions Precautions: Fall Restrictions Weight Bearing Restrictions: Yes LLE Weight Bearing: Non weight bearing Other Position/Activity Restrictions: NWB per Ortho Note      Mobility Bed Mobility Overal bed mobility: Independent                Transfers Overall transfer level: Needs assistance Equipment used: Rolling walker (2 wheeled) Transfers: Sit to/from [facility] Sit to Stand: Min assist Stand pivot transfers: Min assist       General transfer comment: min assist for steadying and walker management. Patient reports "I'm a little nervous" using this thing. Also had to give verbal cues to not allow foot to touch ground.    Balance Overall balance assessment: Needs assistance Sitting-balance support: Bilateral  upper extremity supported;Feet supported Sitting balance-Leahy Scale: Normal     Standing balance support: Bilateral upper extremity supported;During functional activity Standing balance-Leahy Scale: Poor                             ADL either performed or assessed with clinical judgement   ADL Overall ADL's : Needs assistance/impaired Eating/Feeding: Independent;Sitting   Grooming: Independent;Sitting   Upper Body Bathing: Set up;Sitting   Lower Body Bathing: Set up;Sitting/lateral leans   Upper Body Dressing : Set up;Sitting   Lower Body Dressing: Set up;Sit to/from stand;Sitting/lateral leans   Toilet Transfer: Min guard;RW;BSC;Stand-pivot   Toileting- Clothing Manipulation and Hygiene: Moderate assistance;Sit to/from stand Toileting - Clothing Manipulation Details (indicate cue type and reason): Reports nursing assisting with hygiene with toileting.     Functional mobility during ADLs: Minimal assistance;Rolling walker General ADL Comments: Min assist for walker management     Vision Baseline Vision/History: No visual deficits Vision Assessment?: No apparent visual deficits     Perception     Praxis      Pertinent Vitals/Pain Pain Assessment: No/denies pain     Hand Dominance Right   Extremity/Trunk Assessment Upper Extremity Assessment Upper Extremity Assessment: Overall WFL for tasks assessed;RUE deficits/detail;LUE deficits/detail RUE Deficits / Details: 5/5 strength throughout LUE Deficits / Details: 5/5 strength throughout   Lower Extremity Assessment Lower Extremity Assessment: Defer to PT evaluation   Cervical / Trunk Assessment Cervical / Trunk Assessment: Normal   Communication Communication Communication: No difficulties   Cognition Arousal/Alertness:  Awake/alert Behavior During Therapy: WFL for tasks assessed/performed Overall Cognitive Status: Within Functional Limits for tasks assessed                                      General Comments       Exercises     Shoulder Instructions      Home Living Family/patient expects to be discharged to:: Private residence Living Arrangements: Children Available Help at Discharge: Available 24 hours/day (Grandson and Mother at home. He helps take care of his mother.) Type of Home: House Home Access: Ramped entrance     Home Layout: One level     Bathroom Shower/Tub: Astronomer Accessibility: Yes How Accessible: Accessible via wheelchair [place] - built in;Walker - 4 wheels          Prior Functioning/Environment Level of Independence: Independent        Comments: Driving himself to radiation.        OT Problem List: Decreased activity tolerance;Decreased knowledge of use of DME or AE      OT Treatment/Interventions: Self-care/ADL training;DME and/or AE instruction;Therapeutic activities;Patient/family education;Therapeutic exercise    OT Goals(Current goals can be found in the care plan section) Acute Rehab OT Goals Patient Stated Goal: To go home and continue to drive OT Goal Formulation: With patient Time For Goal Achievement: 12/09/19 Potential to Achieve Goals: Good  OT Frequency: Min 2X/week   Barriers to D/C:            Co-evaluation              AM-PAC OT "6 Clicks" Daily Activity     Outcome Measure Help from another person eating meals?: None Help from another person taking care of personal grooming?: A Little Help from another person toileting, which includes using toliet, bedpan, or urinal?: A Lot Help from another person bathing (including washing, rinsing, drying)?: A Little Help from another person to put on and taking off regular upper body clothing?: A Little Help from another person to put on and taking off regular lower body clothing?: A Little 6 Click Score: 18   End of Session Equipment Utilized During Treatment: Gait belt;Rolling walker Nurse  Communication:  (okay to see per Rn)  Activity Tolerance: Patient tolerated treatment well Patient left: in chair;with call bell/phone within reach;with chair alarm set  OT Visit Diagnosis: Unsteadiness on feet (R26.81)                Time: 1761-6073 OT Time Calculation (min): 22 min Charges:  OT General Charges $OT Visit: 1 Visit OT Evaluation $OT Eval Low Complexity: 1 Low  Damar Petit, OTR/L [place]  Office [phone] Pager: [place] 11/25/2019, 1:49 PM

## 2019-11-25 NOTE — Progress Notes (Signed)
Subjective: 2 Days Post-Op Procedure(s) (LRB): IRRIGATION AND DEBRIDEMENT ABSCESS (Left)  Patient reports pain as mild to moderate.  Denies fever, chills, N/V, CP, SOB.  Tolerating POs well. Admits to BM.  Reports that his foot is still numb from the nerve block.  Also admits to underlying peripheral neuropathy.  Objective:   VITALS:  Temp:  [97.5 F (36.4 C)-98.5 F (36.9 C)] 98.2 F (36.8 C) (08/15 0540) Pulse Rate:  [97-118] 101 (08/15 0540) Resp:  [16-18] 17 (08/15 0540) BP: (123-159)/(66-88) 153/73 (08/15 0540) SpO2:  [96 %-100 %] 99 % (08/15 0540)  General: WDWN patient in NAD. Psych:  Appropriate mood and affect. Neuro:  A&O x 3, Moving all extremities, sensation subjectively diminished to light touch HEENT:  EOMs intact Chest:  Even non-labored respirations Skin:  Wound vac C/D/I, no rashes or lesions.  Scant serosanguinous drainage in wound vac. Extremities: warm/dry, mild edema, no erythema or echymosis to L foot.  No lymphadenopathy. Pulses: Dorsalis pedis 2+ MSK:  ROM: EHL/FHL intact, MMT: able to perform quad set, (-) Homan's    LABS Recent Labs    11/22/19 2115 11/22/19 2115 11/23/19 0233 11/23/19 0233 11/24/19 0515 11/24/19 2240 11/25/19 0527  HGB 9.8*  --  8.0*  --  7.0* 8.3* 9.0*  WBC 0.9*   < > 0.9*   < > 1.7*  --  5.2  PLT 54*   < > 34*   < > 31*  --  43*   < > = values in this interval not displayed.   Recent Labs    11/24/19 0515 11/25/19 0527  NA 124* 137  K 2.8* 3.8  CL 93* 96*  CO2 23 30  BUN 15 11  CREATININE 1.24 1.37*  GLUCOSE 259* 237*   Recent Labs    11/22/19 2115 11/23/19 0233  INR 1.1 1.0     Assessment/Plan: 2 Days Post-Op Procedure(s) (LRB): IRRIGATION AND DEBRIDEMENT ABSCESS (Left)  Patient seen in rounds for Dr. Rolena Infante NWB L LE DVT ppx: lovenox ABX: cipro and amoxicillin at D/C x 4 more days per ID recommendation.   Mechele Claude [name] [facility] Office:  [phone]

## 2019-11-25 NOTE — Evaluation (Signed)
Physical Therapy Evaluation Patient Details Name: [name] [name]: [id number] DOB: [birth year] Today's Date: 11/25/2019   History of Present Illness  74 y.o. male with medical history significant of small cell lung cancer, hypertension, CKD stage IIIa, type 2 diabetes mellitus, hyperlipidemia and chronic left foot ulcer presented to ED with a complaint of fever and chills. Admitted with sepsis secondary to foot ulcer. S/p I & D of L foot with wound vac placement 11/23/19  Clinical Impression  On eval, pt required Min assist for mobility. At start of session, he stated he did not like using standard RW when getting up with OT earlier today. He requested to practice using a knee scooter/walker. Pt required steadying assistance to rise and get on knee scooter. He also required steadying assisting while mobilizing on knee scooter/walker. Pt prefers to d/c home and use knee scooter/walker for mobility. Will continue to follow and progress activity as tolerated.     Follow Up Recommendations Home health PT;Supervision/Assistance - 24 hour    Equipment Recommendations  3n1 bedside commode; Rolling walker with 5" wheels. (He would also like a prescription to rent a knee scooter for longer distances)  Recommendations for Other Services       Precautions / Restrictions Precautions Precautions: Fall Precaution Comments: wound vac L foot Restrictions Weight Bearing Restrictions: Yes LLE Weight Bearing: Non weight bearing Other Position/Activity Restrictions: NWB per Ortho Note      Mobility  Bed Mobility Overal bed mobility: Modified Independent                Transfers Overall transfer level: Needs assistance Equipment used:  (bedrail) Transfers: Sit to/from Stand Sit to Stand: Min guard Stand pivot transfers: Min assist       General transfer comment: Pt held onto bedrail for support during sit<.>stand. Cues for safety, NWB on L. He was then able to get onto knee  scooter.  Ambulation/Gait Ambulation/Gait assistance: Min assist Gait Distance (Feet): 75 Feet Assistive device:  (Knee scooter)       General Gait Details: Pt did not ambulate. He requested to practice using a knee scooter (he prefers this to [employer])  Financial trader [employer] (Stroke Patients Only)       Balance Overall balance assessment: Needs assistance Sitting-balance support: Bilateral upper extremity supported;Feet supported Sitting balance-Leahy Scale: Normal     Standing balance support: Single extremity supported;Bilateral upper extremity supported Standing balance-Leahy Scale: Poor                               Pertinent Vitals/Pain Pain Assessment: No/denies pain    Home Living Family/patient expects to be discharged to:: Private residence Living Arrangements: Children Available Help at Discharge: Available 24 hours/day Type of Home: House Home Access: Ramped entrance     Home Layout: One level Home Equipment: Shower seat - built in;Walker - 4 wheels      Prior Function Level of Independence: Independent         Comments: Driving himself to radiation.     Hand Dominance        Extremity/Trunk Assessment   Upper Extremity Assessment Upper Extremity Assessment: Defer to OT evaluation RUE Deficits / Details: 5/5 strength throughout LUE Deficits / Details: 5/5 strength throughout    Lower Extremity Assessment Lower Extremity Assessment: Generalized weakness    Cervical / Trunk Assessment  Cervical / Trunk Assessment: Normal  Communication   Communication: No difficulties  Cognition Arousal/Alertness: Awake/alert Behavior During Therapy: WFL for tasks assessed/performed Overall Cognitive Status: Within Functional Limits for tasks assessed                                        General Comments      Exercises     Assessment/Plan    PT Assessment Patient needs  continued PT services  PT Problem List Decreased strength;Decreased mobility;Decreased activity tolerance;Decreased balance;Decreased knowledge of use of DME;Decreased knowledge of precautions       PT Treatment Interventions DME instruction;Gait training;Therapeutic activities;Therapeutic exercise;Balance training;Functional mobility training;Patient/family education    PT Goals (Current goals can be found in the Care Plan section)  Acute Rehab PT Goals Patient Stated Goal: To go home and continue to drive PT Goal Formulation: With patient Time For Goal Achievement: 12/09/19 Potential to Achieve Goals: Good    Frequency Min 3X/week   Barriers to discharge        Co-evaluation               AM-PAC PT "6 Clicks" Mobility  Outcome Measure Help needed turning from your back to your side while in a flat bed without using bedrails?: None Help needed moving from lying on your back to sitting on the side of a flat bed without using bedrails?: None Help needed moving to and from a bed to a chair (including a wheelchair)?: A Little Help needed standing up from a chair using your arms (e.g., wheelchair or bedside chair)?: A Little Help needed to walk in hospital room?: A Little Help needed climbing 3-5 steps with a railing? : Total 6 Click Score: 18    End of Session Equipment Utilized During Treatment: Gait belt Activity Tolerance: Patient tolerated treatment well Patient left: in bed;with call bell/phone within reach;with bed alarm set   PT Visit Diagnosis: Unsteadiness on feet (R26.81);Difficulty in walking, not elsewhere classified (R26.2)    Time: 5183-3582 PT Time Calculation (min) (ACUTE ONLY): 38 min   Charges:   PT Evaluation $PT Eval Low Complexity: 1 Low PT Treatments $Gait Training: 23-37 mins           Doreatha Massed, PT Acute Rehabilitation  Office: [phone] Pager: [phone]

## 2019-11-25 NOTE — Progress Notes (Signed)
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  It appears he presented to the ED 11/15/2019 syncope x2 had a work-up with negative PE work-up orthostatics were positive he was given IV fluids and told to go home without his blood pressure meds and also had been seen recently at oncology office but did not mention worsening wounds in his left leg  Presented to the [facility] ED 11/22/2019 fever, chills tachycardic 150s, febrile 101.8 and found to have lower left extremity wound Lactic acid 3 White count 0.9  Orthopedics Dr. Rolena Infante saw the patient in consult after MRI and ABIs and elected to perform I&D   Assessment & Plan:   Active Problems:   Hypertriglyceridemia   Essential hypertension   Chronic kidney disease, stage III (moderate)   Uncontrolled type 2 diabetes mellitus with peripheral neuropathy (HCC)   Small cell lung cancer (HCC)   Severe sepsis (HCC)   Neutropenic fever ([place])   Acute renal failure superimposed on stage 3a chronic kidney disease (HCC)   Foot abscess, left   1. Severe sepsis (lactic acidosis AKI on admission ) 2/2 acute severe ulcers as well as infectious process with cellulitis of the left foot-underwent debridement 11/23/2019 of wound and will probably need definitive care going forward a. Bolused 3 L in ED continue high-dose fluids as below b. Appreciate orthopedics input and will probably need further input from Dr. Sharol Given later this week c. Infectious disease saw the patient and recommended Cipro Flagyl and discontinued IV  antibiotics d. Nearing discharge in the next 24 to 48 hours 2. Likely hypervolemic hyponatremia with severe hypokalemia 3. Hypomagnesemia 1.5 a. Resolved with mild fluid restriction and eating more solute and was likely secondary to low solute and polydipsia b. Discontinue saline 100 cc/h with 40 of K as has resolved hypokalemia c. Magnesium being replaced again second time with 2 g repeat in a.m. 4. NSCLC with pancytopenia likely from infection and chemo 5. Expected blood loss anemia from surgery and hemodilution a. ANC is elevated do not need neutropenic precautions b. Granix ordered ending 8/15 c. Transfused 1 unit PRBC 8/14 with good response 6. AKI on admission-baseline 1.3 a. Resolved and at baseline at this time b. Stop fluid resuscitation today 7. HTN 8. Persistent sinus tachycardia per patient since his cancer diagnosis a. Holding Cardura b. STcontrolled controlled on metoprolol XL 12.5 c. D/c monitors 9. DM TY 2 A1c 10 indicating poor control-on 11-94?- R740 complicated by hypoglycemic to the 40s on 8/14 a. Continue U500 at doses of 15-15-15 10. OSA on CPAP a. Not clear refuses will order for tonight 11. Asthma 12. Dementia  DVT prophylaxis: lovenox Code Status: full Family Communication: No family present today updated patient alone Disposition:   Status is: Inpatient  Remains inpatient appropriate because:Ongoing active pain requiring inpatient pain management, Ongoing diagnostic testing needed not appropriate for outpatient work up, Unsafe d/c plan and IV treatments appropriate due to intensity of illness or inability to take PO   Dispo: The patient is from: Home              Anticipated d/c is to: SNF  Anticipated d/c date is: 2 days              Patient currently is not medically stable to d/c.   Consultants:   Ortho -Dr. Rolena Infante  Procedures: MRI/ABI 8/13 a. MRI did not show osteomyelitis bu severe ulceration foot fourth fifth metatarsal bases  t and displaced fracture of fifth proximal phalanx b. ABIs 8/13 show good flow  Antimicrobials: Vanc Cefepime    Subjective:  Doing well had difficulty moving with walker Orthopedic note reviewed-it appears that the wound will be reviewed and reassess tomorrow? No chest pain no fever no nausea no vomiting  Objective: Vitals:   11/24/19 1830 11/24/19 2119 11/25/19 0132 11/25/19 0540  BP: (!) 159/88 (!) 153/77 (!) 153/82 (!) 153/73  Pulse: (!) 103 (!) 105 97 (!) 101  Resp: 18 18 17 17   Temp: 97.7 F (36.5 C) 98.3 F (36.8 C) 97.8 F (36.6 C) 98.2 F (36.8 C)  TempSrc: Oral Oral Oral Oral  SpO2: 98% 99% 97% 99%  Weight:      Height:        Intake/Output Summary (Last 24 hours) at 11/25/2019 1113 Last data filed at 11/25/2019 1051 Gross per 24 hour  Intake 3244.06 ml  Output 4440 ml  Net -1195.94 ml   Filed Weights   11/22/19 2038 11/23/19 0434  Weight: 95.7 kg 83.6 kg    Examination:  General exam: younger appearing than age Respiratory system: cta b no added sound Cardiovascular system: s1 s2 no m/r/g he has resolving sinus tach with no concerning features on monitors and is in fact a little bradycardic into the 50s Gastrointestinal system: soft nt nd Central nervous system: intact--sensory intact LE  Extremities: wound L foot now has a VAC on it Psychiatry:  pleasant  Data Reviewed: I have personally reviewed following labs and imaging studies  WBC 0.9-->1.7-->5.2 Hb 9.8-->8.0-->7.0 giving 1 u prbc-->9.0 PLT 54-->34-->43 Na 130--> 124-->137 BUN/creatinine down from admission 24/1.7 211/1.3 Magnesium 1.5  Creat still 1.7  Radiology Studies: MR FOOT LEFT W WO CONTRAST  Result Date: 11/23/2019 CLINICAL DATA:  Chronic left foot ulcer with erythema and recent drainage. History of lung cancer. Concern for osteomyelitis. EXAM: MRI OF THE LEFT FOREFOOT WITHOUT AND WITH CONTRAST TECHNIQUE: Multiplanar, multisequence MR imaging of the left forefoot was performed  both before and after administration of intravenous contrast. CONTRAST:  61mL GADAVIST GADOBUTROL 1 MMOL/ML IV SOLN COMPARISON:  Radiographs 11/22/2019 and 02/12/2019 FINDINGS: Bones/Joint/Cartilage There is soft tissue ulceration along the plantar aspect of the midfoot at the level of the 4th and 5th metatarsal bases. There appears to be mild soft tissue ulceration along the plantar aspect of the 5th metatarsal head as well. No underlying cortical destruction, marrow edema or suspicious marrow enhancement. There are mild midfoot and 1st MTP degenerative changes without significant joint effusions. The tibial sesamoid of the 1st metatarsal is bipartite. The distal toes are incompletely visualized. Possible nondisplaced transverse fracture through the base of the 5th proximal phalanx. Ligaments Intact Lisfranc ligament. 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 No focal intramuscular fluid collection or tenosynovitis. The ankle tendons appear intact. Soft tissues As above, apparent soft tissue ulceration along the plantar aspect of the 4th and 5th metatarsal bases and the 5th metatarsal head. There is associated heterogeneous enhancement following contrast, but no evidence of drainable fluid collection or foreign body. IMPRESSION: 1. Soft tissue ulceration along the plantar aspect of the midfoot at the level  of the 4th and 5th metatarsal bases and the 5th metatarsal head. No evidence of drainable fluid collection or foreign body. 2. No evidence of osteomyelitis. 3. Possible nondisplaced transverse fracture through the base of the 5th proximal phalanx. Electronically Signed   By: Richardean Sale M.D.   On: 11/23/2019 16:31   VAS [place] ABI WITH/WO TBI  Result Date: 11/24/2019 LOWER EXTREMITY DOPPLER STUDY Indications: Ulceration of LT foot. High Risk Factors: Hypertension, Diabetes.  Comparison Study: No prior studies. Performing Technologist: Darlin Coco  Examination Guidelines: A complete evaluation includes at minimum, Doppler waveform signals and systolic blood pressure reading at the level of bilateral brachial, anterior tibial, and posterior tibial arteries, when vessel segments are accessible. Bilateral testing is considered an integral part of a complete examination. Photoelectric Plethysmograph (PPG) waveforms and toe systolic pressure readings are included as required and additional duplex testing as needed. Limited examinations for reoccurring indications may be performed as noted.  ABI Findings: +---------+------------------+-----+---------+--------+ Right    Rt Pressure (mmHg)IndexWaveform Comment  +---------+------------------+-----+---------+--------+ Brachial 154                    triphasic         +---------+------------------+-----+---------+--------+ PTA      161               1.05 triphasic         +---------+------------------+-----+---------+--------+ DP       143               0.93 triphasic         +---------+------------------+-----+---------+--------+ Great Toe100               0.65 Abnormal          +---------+------------------+-----+---------+--------+ +---------+------------------+-----+---------+-------+ Left     Lt Pressure (mmHg)IndexWaveform Comment +---------+------------------+-----+---------+-------+ Brachial 152                    triphasic        +---------+------------------+-----+---------+-------+ PTA      155               1.01 triphasic        +---------+------------------+-----+---------+-------+ DP       114               0.74 triphasic        +---------+------------------+-----+---------+-------+ Great Toe120               0.78 Normal           +---------+------------------+-----+---------+-------+ +-------+-----------+-----------+------------+------------+ ABI/TBIToday's ABIToday's TBIPrevious ABIPrevious TBI  +-------+-----------+-----------+------------+------------+ Right  1.05       0.65                                +-------+-----------+-----------+------------+------------+ Left   1.01       0.78                                +-------+-----------+-----------+------------+------------+  Summary: Right: Resting right ankle-brachial index is within normal range. No evidence of significant right lower extremity arterial disease. The right toe-brachial index is mildly abnormal. Left: Resting left ankle-brachial index is within normal range. No evidence of significant left lower extremity arterial disease. The left toe-brachial index is normal.  *See table(s) above for measurements and observations.  Electronically signed by Ruta Hinds MD on 11/24/2019 at 12:11:40 PM.  Final      Scheduled Meds: . sodium chloride   Intravenous Once  . donepezil  10 mg Oral QHS  . insulin aspart  0-5 Units Subcutaneous QHS  . insulin regular human CONCENTRATED  15 Units Subcutaneous TID WC  . loperamide  4 mg Oral BID  . melatonin  3 mg Oral QHS  . memantine  10 mg Oral BID  . metoprolol succinate  12.5 mg Oral Daily  . montelukast  10 mg Oral QHS  . pantoprazole  40 mg Oral Daily  . simvastatin  40 mg Oral q1800  . sucralfate  1 g Oral QID  . Tbo-filgastrim (GRANIX) SQ  300 mcg Subcutaneous q1800  . venlafaxine XR  75 mg Oral BID   Continuous Infusions: . 0.9 % NaCl with KCl 40 mEq / L 100 mL/hr at 11/25/19 0455  . ceFEPime (MAXIPIME) IV 2 g (11/25/19 0912)     LOS: 3 days    Time spent: [place], MD [facility] To contact the attending provider between 7A-7P or the covering provider during after hours 7P-7A, please log into the web site [email or web address] and access using universal [place] password for that web site. If you do not have the password, please call the hospital operator.  11/25/2019, 11:13 AM

## 2019-11-26 ENCOUNTER — Inpatient Hospital Stay (HOSPITAL_COMMUNITY): Payer: Medicare Other

## 2019-11-26 ENCOUNTER — Inpatient Hospital Stay: Payer: Medicare Other

## 2019-11-26 ENCOUNTER — Other Ambulatory Visit: Payer: Self-pay | Admitting: Family Medicine

## 2019-11-26 ENCOUNTER — Ambulatory Visit
Admission: RE | Admit: 2019-11-26 | Discharge: 2019-11-26 | Disposition: A | Discharge status: Home / Self Care | Payer: Medicare Other | Source: Ambulatory Visit | Attending: Radiation Oncology | Admitting: Radiation Oncology

## 2019-11-26 ENCOUNTER — Ambulatory Visit: Payer: Medicare Other

## 2019-11-26 DIAGNOSIS — C3431 Malignant neoplasm of lower lobe, right bronchus or lung: Secondary | ICD-10-CM | POA: Diagnosis not present

## 2019-11-26 LAB — CBC WITH DIFFERENTIAL/PLATELET
Abs Immature Granulocytes: 0.4 10*3/uL — ABNORMAL HIGH (ref 0.00–0.07)
Band Neutrophils: 8 %
Basophils Absolute: 0 10*3/uL (ref 0.0–0.1)
Basophils Relative: 0 %
Eosinophils Absolute: 0 10*3/uL (ref 0.0–0.5)
Eosinophils Relative: 0 %
HCT: 25.8 % — ABNORMAL LOW (ref 39.0–52.0)
Hemoglobin: 8.8 g/dL — ABNORMAL LOW (ref 13.0–17.0)
Lymphocytes Relative: 3 %
Lymphs Abs: 0.1 10*3/uL — ABNORMAL LOW (ref 0.7–4.0)
MCH: 27.6 pg (ref 26.0–34.0)
MCHC: 34.1 g/dL (ref 30.0–36.0)
MCV: 80.9 fL (ref 80.0–100.0)
Metamyelocytes Relative: 2 %
Monocytes Absolute: 0.4 10*3/uL (ref 0.1–1.0)
Monocytes Relative: 9 %
Myelocytes: 5 %
Neutro Abs: 3.8 10*3/uL (ref 1.7–7.7)
Neutrophils Relative %: 72 %
Platelets: 52 10*3/uL — ABNORMAL LOW (ref 150–400)
Promyelocytes Relative: 1 %
RBC: 3.19 MIL/uL — ABNORMAL LOW (ref 4.22–5.81)
RDW: 13.3 % (ref 11.5–15.5)
WBC: 4.7 10*3/uL (ref 4.0–10.5)
nRBC: 0.4 % — ABNORMAL HIGH (ref 0.0–0.2)

## 2019-11-26 LAB — COMPREHENSIVE METABOLIC PANEL
ALT: 15 U/L (ref 0–44)
AST: 15 U/L (ref 15–41)
Albumin: 2.6 g/dL — ABNORMAL LOW (ref 3.5–5.0)
Alkaline Phosphatase: 74 U/L (ref 38–126)
Anion gap: 11 (ref 5–15)
BUN: 10 mg/dL (ref 8–23)
CO2: 30 mmol/L (ref 22–32)
Calcium: 8.1 mg/dL — ABNORMAL LOW (ref 8.9–10.3)
Chloride: 94 mmol/L — ABNORMAL LOW (ref 98–111)
Creatinine, Ser: 1.07 mg/dL (ref 0.61–1.24)
GFR calc Af Amer: >60 mL/min (ref >60)
GFR calc non Af Amer: >60 mL/min (ref >60)
Glucose, Bld: 221 mg/dL — ABNORMAL HIGH (ref 70–99)
Potassium: 3.1 mmol/L — ABNORMAL LOW (ref 3.5–5.1)
Sodium: 135 mmol/L (ref 135–145)
Total Bilirubin: 1 mg/dL (ref 0.3–1.2)
Total Protein: 5.5 g/dL — ABNORMAL LOW (ref 6.5–8.1)

## 2019-11-26 LAB — TROPONIN I (HIGH SENSITIVITY)
Troponin I (High Sensitivity): 9 ng/L (ref <18)
Troponin I (High Sensitivity): 9 ng/L (ref <18)

## 2019-11-26 LAB — GLUCOSE, CAPILLARY
Glucose-Capillary: 226 mg/dL — ABNORMAL HIGH (ref 70–99)
Glucose-Capillary: 223 mg/dL — ABNORMAL HIGH (ref 70–99)
Glucose-Capillary: 136 mg/dL — ABNORMAL HIGH (ref 70–99)
Glucose-Capillary: 84 mg/dL (ref 70–99)

## 2019-11-26 MED ORDER — SODIUM CHLORIDE 0.9 % IV SOLN
2.0000 g | Freq: Three times a day (TID) | INTRAVENOUS | Status: DC
  Administered 2019-11-26 (×2): 2 g via INTRAVENOUS
  Filled 2019-11-26 (×3): qty 2

## 2019-11-26 MED ORDER — POTASSIUM CHLORIDE CRYS ER 20 MEQ PO TBCR
40.0000 meq | EXTENDED_RELEASE_TABLET | Freq: Once | ORAL | Status: AC
  Administered 2019-11-26: 40 meq via ORAL
  Filled 2019-11-26: qty 2

## 2019-11-26 NOTE — Consult Note (Signed)
   [facility] CM Inpatient Consult   11/26/2019  MARSHAL ESKEW April 02, 1946 [id number]   Patient is currently active with [facility] for chronic disease management services.  Patient has been engaged by a [facility].   Will continue to follow for progression and disposition and notify community RN of patient admission.  Of note, [facility] Care Management services does not replace or interfere with any services that are needed or arranged by inpatient case management or social work.   Netta Cedars, MSN, [facility] Liaison Nurse Mobile Phone [phone]  Toll free office [phone]

## 2019-11-26 NOTE — Progress Notes (Addendum)
Subjective: 3 Days Post-Op Procedure(s) (LRB): IRRIGATION AND DEBRIDEMENT ABSCESS (Left) Patient reports pain as 0 on 0-10 scale.    Objective: Vital signs in last 24 hours: Temp:  [97.5 F (36.4 C)-98.2 F (36.8 C)] 97.5 F (36.4 C) (08/16 1443) Pulse Rate:  [91-101] 91 (08/16 1443) Resp:  [16-18] 16 (08/16 1443) BP: (145-169)/(77-95) 145/77 (08/16 1443) SpO2:  [92 %-100 %] 100 % (08/16 1443)  Intake/Output from previous day: 08/15 0701 - 08/16 0700 In: 480 [P.O.:480] Out: 2390 [Urine:2390] Intake/Output this shift: Total I/O In: 480 [P.O.:480] Out: 1675 [Urine:1675]  Recent Labs    11/24/19 0515 11/24/19 2240 11/25/19 0527 11/26/19 0531  HGB 7.0* 8.3* 9.0* 8.8*   Recent Labs    11/25/19 0527 11/26/19 0531  WBC 5.2 4.7  RBC 3.25* 3.19*  HCT 26.8* 25.8*  PLT 43* 52*   Recent Labs    11/25/19 0527 11/26/19 0531  NA 137 135  K 3.8 3.1*  CL 96* 94*  CO2 30 30  BUN 11 10  CREATININE 1.37* 1.07  GLUCOSE 237* 221*  CALCIUM 8.3* 8.1*   No results for input(s): LABPT, INR in the last 72 hours.  AAOx3, NAD Normal Mood and affect Isolated hip and knee ROM normal and painfree Wound Vac working well   Assessment/Plan: 3 Days Post-Op Procedure(s) (LRB): IRRIGATION AND DEBRIDEMENT ABSCESS (Left)  Plan D/C tomorrow if OK'd by medicine team. Dressing change tomorrow am before D/C Abx per ID and pharmacy (cipro and amoxicillin) Dressing changes at [facility] wound center where patient is already established M/W/F NWB L LE DVT ppx: lovenox F/u as am out-patient next week with Dr. Hansel Starling [name] 11/26/2019, 4:53 PM

## 2019-11-26 NOTE — Progress Notes (Signed)
Pharmacy Antibiotic Note  [name] is a 74 y.o. male admitted on 11/22/2019 with sepsis. S/p I&D L foot abscess on 11/23/2019. Pharmacy has been consulted for Cefepime dosing.  Plan:  Adjust Cefepime to 2g IV q8h for CrCl > 60 ml/min Per ID notes, plan to discharge on Cipro + Amoxicillin (end date ~ 11/29/2019)  Height: 5\' 11"  (180.3 cm) Weight: 83.6 kg (184 lb 4.8 oz) IBW/kg (Calculated) : 75.3  Temp (24hrs), Avg:97.9 F (36.6 C), Min:97.7 F (36.5 C), Max:98.2 F (36.8 C)  Recent Labs  Lab 11/22/19 2115 11/23/19 0233 11/24/19 0515 11/25/19 0527 11/26/19 0531  WBC 0.9* 0.9* 1.7* 5.2 4.7  CREATININE 1.78* 1.71* 1.24 1.37* 1.07  LATICACIDVEN 3.0* 1.0  --   --   --     Estimated Creatinine Clearance: 64.5 mL/min (by C-G formula based on SCr of 1.07 mg/dL).    Allergies  Allergen Reactions   Tetracycline Itching and Rash    Antimicrobials this admission:  8/12 Zosyn x 1 8/12 Vancomycin >> 8/14 8/13 Cefepime >>  8/13 Metronidazole >> 8/13  Microbiology results:  8/12 BCx: NGTD 8/13 BCx: NGTD 8/13 UCx: NGF 8/13 L foot abscess: NGTD  Thank you for allowing pharmacy to be a part of this patients care.   Lindell Spar, PharmD, BCPS Clinical Pharmacist  11/26/2019 8:30 AM

## 2019-11-26 NOTE — Progress Notes (Signed)
PROGRESS NOTE    [name]  SWF:[id number] DOB: 01/02/1946 DOA: 11/22/2019 PCP: [name], MD  Brief Narrative:  61 white male Diagnosed NSCLC [currently carboplatin/etoposide + XRT]  critical obstruction bronchus intermedius resulting in  hemoptysis hospitalized 7 7-10/18/2019-at that admission DKA- Underlying CKD 3b Poorly ctll DM TY 2 with nephropathy OSA on CPAP Chronic left leg ulcer X 5 months followed in [place] with wound care HTN Prior oral thrush Dementia/depression?  Seen in  ED 11/15/2019 syncope x2 had a work-up with negative PE work-up orthostatics were positive he was given IV fluids and told to go home without his blood pressure meds and also had been seen recently at oncology office but did not mention worsening wounds in his left leg to them  return [facility] ED 11/22/2019 fever, chills tachycardic 150s, febrile 101.8 and found to have lower left extremity wound Lactic acid 3 White count 0.9  Orthopedics Dr. Rolena Infante saw the patient in consult after MRI and ABIs -I&D performed 8/13 and wound VAC was placed  Infectious disease saw the patient in consult and felt could use oral antibiotics Assessment & Plan:   Active Problems:   Hypertriglyceridemia   Essential hypertension   Chronic kidney disease, stage III (moderate)   Uncontrolled type 2 diabetes mellitus with peripheral neuropathy (HCC)   Small cell lung cancer (HCC)   Severe sepsis (HCC)   Neutropenic fever ([place])   Acute renal failure superimposed on stage 3a chronic kidney disease (HCC)   Foot abscess, left   1. Severe sepsis (lactic acidosis AKI on admission ) 2/2 acute severe ulcers as well as infectious process with cellulitis of the left foot-underwent debridement 11/23/2019 of wound and will probably need definitive care going forward a. Bolused 3 L in ED but sepsis physiology resolved b. Have reached out to contact Dr. Sharol Given to assess wounds in person prior to transition to oral  antibiotics c. Appreciate infectious disease input-if all stable and no operative further management required then likely transition to oral antibiotics and discharge home 8/17 2. Likely hypovolemic hyponatremia 2/2 sepsis 3. Severe hypokalemia resolving now off IV supplementation 4. Hypomagnesemia 1.5 a. Hyponatremia has resolved b. Replacing orally with 40 of K. Dur c. Recheck magnesium tomorrow morning-received on 2 separate days IV 5. NSCLC with pancytopenia likely from infection and chemo 6. Expected blood loss anemia from surgery and hemodilution a. ANC stabilized b. Granix given for 3 days ending 8/15 c. Transfused 1 unit PRBC 8/14 with good response 7. AKI on admission secondary to sepsis-baseline 1.3 a. Resolved and at baseline at this time b. Fluids discontinued 8/15 8. HTN 9. Persistent sinus tachycardia per patient since his cancer diagnosis a. Holding Cardura b. ST controlled controlled on metoprolol XL 12.5 c. D/c monitors 10. DM TY 2 A1c 10 indicating poor control-on 22-02?- R427 complicated by hypoglycemic to the 40s on 8/14 a. Continue U500 at doses of 15-15-15 b. Blood sugars reasonably controlled 1 50-2 26 11. OSA on CPAP a. Not clear refuses will order for tonight 12. Asthma 13. Dementia  DVT prophylaxis: lovenox Code Status: full Family Communication: Updated patient alone Disposition:   Status is: Inpatient  Remains inpatient appropriate because:Ongoing active pain requiring inpatient pain management, Ongoing diagnostic testing needed not appropriate for outpatient work up, Unsafe d/c plan and IV treatments appropriate due to intensity of illness or inability to take PO   Dispo: The patient is from: Home  Anticipated d/c is to: SNF              Anticipated d/c date is: 2 days              Patient currently is not medically stable to d/c.   Consultants:   Ortho -Dr. Rolena Infante  Procedures: MRI/ABI 8/13 a. MRI did not show osteomyelitis bu  severe ulceration foot fourth fifth metatarsal bases t and displaced fracture of fifth proximal phalanx b. ABIs 8/13 show good flow  Antimicrobials: Vanc Cefepime    Subjective:  Seen by therapy again today Not having any overt pain and is eating and drinking well without any issue No chest pain Has moved around some with therapy  Objective: Vitals:   11/25/19 1232 11/25/19 2213 11/26/19 0551 11/26/19 0906  BP: (!) 150/82 (!) 162/79 (!) 169/91 (!) 149/95  Pulse: 93 98 94 (!) 101  Resp: 18 18 18    Temp: 97.7 F (36.5 C) 98.2 F (36.8 C) 97.9 F (36.6 C)   TempSrc: Oral Oral Oral   SpO2: 100% 99% 92%   Weight:      Height:        Intake/Output Summary (Last 24 hours) at 11/26/2019 1033 Last data filed at 11/26/2019 0750 Gross per 24 hour  Intake 240 ml  Output 2325 ml  Net -2085 ml   Filed Weights   11/22/19 2038 11/23/19 0434  Weight: 95.7 kg 83.6 kg    Examination:   coherent alert no distress EOMI NCAT Chest clear Abdomen soft nontender no rebound no guarding ROM intact Wound VAC still in place   Data Reviewed: I have personally reviewed following labs and imaging studies  WBC 0.9-->1.7--> 4.7 Hb 9.8-->8.0-->7.0 giving 1 u prbc--> currently 8.8 PLT 54-->34-->43-->52 Na 130--> 124-->137-->135 BUN/creatinine down from admission 24/1.7-->10/1.0  Radiology Studies: No results found.   Scheduled Meds:  donepezil  10 mg Oral QHS   insulin aspart  0-5 Units Subcutaneous QHS   insulin regular human CONCENTRATED  15 Units Subcutaneous TID WC   loperamide  4 mg Oral BID   melatonin  3 mg Oral QHS   memantine  10 mg Oral BID   metoprolol succinate  12.5 mg Oral Daily   montelukast  10 mg Oral QHS   pantoprazole  40 mg Oral Daily   potassium chloride  40 mEq Oral Once   simvastatin  40 mg Oral q1800   sucralfate  1 g Oral QID   venlafaxine XR  75 mg Oral BID   Continuous Infusions:  ceFEPime (MAXIPIME) IV       LOS: 4 days    Time  spent: 15  Nita Sells, MD [facility] To contact the attending provider between 7A-7P or the covering provider during after hours 7P-7A, please log into the web site [email or web address] and access using universal [place] password for that web site. If you do not have the password, please call the hospital operator.  11/26/2019, 10:33 AM

## 2019-11-26 NOTE — Care Management Important Message (Signed)
Important Message  Patient Details IM Letter given to the Patient Name: [name] [name]: [id number] Date of Birth: [birth year]   Medicare Important Message Given:  Yes     Kerin Salen 11/26/2019, 11:02 AM

## 2019-11-26 NOTE — Progress Notes (Signed)
Physical Therapy Treatment Patient Details Name: [name] [name]: [id number] DOB: 1945-10-06 Today's Date: 11/26/2019    History of Present Illness 74 y.o. male with medical history significant of small cell lung cancer, hypertension, CKD stage IIIa, type 2 diabetes mellitus, hyperlipidemia and chronic left foot ulcer presented to ED with a complaint of fever and chills. Admitted with sepsis secondary to foot ulcer. S/p I & D of L foot with wound vac placement 11/23/19    PT Comments    Mobility improving. Will recommend HHPT f/u for continued safety and balance training with RW and knee scooter. Pt stated he will have assistance in home as needed from family. He will need to use RW for pivoting, very short distances. Knee scooter/walker can be used for longer distances. Encouraged use of 3n1 and/or urinal for safety for toileting needs.    Follow Up Recommendations  Home health PT;Supervision/Assistance - 24 hour     Equipment Recommendations  3in1; he would like a prescription for a knee scooter/walker as well.    Recommendations for Other Services       Precautions / Restrictions Precautions Precautions: Fall Precaution Comments: wound vac L foot Restrictions Weight Bearing Restrictions: Yes LLE Weight Bearing: Non weight bearing    Mobility  Bed Mobility Overal bed mobility: Modified Independent                Transfers Overall transfer level: Needs assistance Equipment used:  (bedrail) Transfers: Sit to/from Stand Sit to Stand: Min assist         General transfer comment: x2. Once with bedrail, once with RW. Cues for safety, technique, NWB on L. Practiced x 1 getting on/off knee scooter, then x 1 sit to stand with RW  Ambulation/Gait Ambulation/Gait assistance: Min assist;Min guard   Assistive device:  (knee scooter)       General Gait Details: Pt did not ambulate. He requested to practice using a knee scooter (he prefers this to RW). 150 feet with  knee scooter. Min assist initially but quickly progresses to Min guard.    Stairs             Wheelchair Mobility    Modified Rankin (Stroke Patients Only)       Balance Overall balance assessment: Needs assistance         Standing balance support: Single extremity supported;Bilateral upper extremity supported Standing balance-Leahy Scale: Poor                              Cognition Arousal/Alertness: Awake/alert Behavior During Therapy: WFL for tasks assessed/performed Overall Cognitive Status: Within Functional Limits for tasks assessed                                        Exercises      General Comments        Pertinent Vitals/Pain Pain Assessment: No/denies pain    Home Living                      Prior Function            PT Goals (current goals can now be found in the care plan section) Progress towards PT goals: Progressing toward goals    Frequency    Min 3X/week      PT Plan Current plan remains appropriate  Co-evaluation              AM-PAC PT "6 Clicks" Mobility   Outcome Measure  Help needed turning from your back to your side while in a flat bed without using bedrails?: None Help needed moving from lying on your back to sitting on the side of a flat bed without using bedrails?: None Help needed moving to and from a bed to a chair (including a wheelchair)?: A Little Help needed standing up from a chair using your arms (e.g., wheelchair or bedside chair)?: A Little Help needed to walk in hospital room?: A Little Help needed climbing 3-5 steps with a railing? : Total 6 Click Score: 18    End of Session Equipment Utilized During Treatment: Gait belt Activity Tolerance: Patient tolerated treatment well Patient left: in bed;with call bell/phone within reach;with bed alarm set   PT Visit Diagnosis: Unsteadiness on feet (R26.81);Difficulty in walking, not elsewhere classified  (R26.2)     Time: 3374-4514 PT Time Calculation (min) (ACUTE ONLY): 19 min  Charges:  $Gait Training: 8-22 mins                         Doreatha Massed, PT Acute Rehabilitation  Office: [phone] Pager: [phone]

## 2019-11-27 ENCOUNTER — Ambulatory Visit: Payer: Medicare Other

## 2019-11-27 ENCOUNTER — Ambulatory Visit
Admission: RE | Admit: 2019-11-27 | Discharge: 2019-11-27 | Disposition: A | Discharge status: Home / Self Care | Payer: Medicare Other | Source: Ambulatory Visit | Attending: Radiation Oncology | Admitting: Radiation Oncology

## 2019-11-27 DIAGNOSIS — C3431 Malignant neoplasm of lower lobe, right bronchus or lung: Secondary | ICD-10-CM | POA: Diagnosis not present

## 2019-11-27 LAB — COMPREHENSIVE METABOLIC PANEL
ALT: 13 U/L (ref 0–44)
AST: 13 U/L — ABNORMAL LOW (ref 15–41)
Albumin: 2.6 g/dL — ABNORMAL LOW (ref 3.5–5.0)
Alkaline Phosphatase: 87 U/L (ref 38–126)
Anion gap: 9 (ref 5–15)
BUN: 14 mg/dL (ref 8–23)
CO2: 30 mmol/L (ref 22–32)
Calcium: 8.1 mg/dL — ABNORMAL LOW (ref 8.9–10.3)
Chloride: 93 mmol/L — ABNORMAL LOW (ref 98–111)
Creatinine, Ser: 1.2 mg/dL (ref 0.61–1.24)
GFR calc Af Amer: >60 mL/min (ref >60)
GFR calc non Af Amer: 59 mL/min — ABNORMAL LOW (ref >60)
Glucose, Bld: 282 mg/dL — ABNORMAL HIGH (ref 70–99)
Potassium: 3.2 mmol/L — ABNORMAL LOW (ref 3.5–5.1)
Sodium: 132 mmol/L — ABNORMAL LOW (ref 135–145)
Total Bilirubin: 0.6 mg/dL (ref 0.3–1.2)
Total Protein: 5.7 g/dL — ABNORMAL LOW (ref 6.5–8.1)

## 2019-11-27 LAB — GLUCOSE, CAPILLARY
Glucose-Capillary: 117 mg/dL — ABNORMAL HIGH (ref 70–99)
Glucose-Capillary: 214 mg/dL — ABNORMAL HIGH (ref 70–99)
Glucose-Capillary: 301 mg/dL — ABNORMAL HIGH (ref 70–99)
Glucose-Capillary: 324 mg/dL — ABNORMAL HIGH (ref 70–99)
Glucose-Capillary: 59 mg/dL — ABNORMAL LOW (ref 70–99)
Glucose-Capillary: 99 mg/dL (ref 70–99)

## 2019-11-27 LAB — CBC WITH DIFFERENTIAL/PLATELET
Abs Immature Granulocytes: 0.58 10*3/uL — ABNORMAL HIGH (ref 0.00–0.07)
Basophils Absolute: 0.1 10*3/uL (ref 0.0–0.1)
Basophils Relative: 1 %
Eosinophils Absolute: 0 10*3/uL (ref 0.0–0.5)
Eosinophils Relative: 0 %
HCT: 26.6 % — ABNORMAL LOW (ref 39.0–52.0)
Hemoglobin: 9.1 g/dL — ABNORMAL LOW (ref 13.0–17.0)
Immature Granulocytes: 10 %
Lymphocytes Relative: 6 %
Lymphs Abs: 0.4 10*3/uL — ABNORMAL LOW (ref 0.7–4.0)
MCH: 27.9 pg (ref 26.0–34.0)
MCHC: 34.2 g/dL (ref 30.0–36.0)
MCV: 81.6 fL (ref 80.0–100.0)
Monocytes Absolute: 0.8 10*3/uL (ref 0.1–1.0)
Monocytes Relative: 14 %
Neutro Abs: 4.1 10*3/uL (ref 1.7–7.7)
Neutrophils Relative %: 69 %
Platelets: 69 10*3/uL — ABNORMAL LOW (ref 150–400)
RBC: 3.26 MIL/uL — ABNORMAL LOW (ref 4.22–5.81)
RDW: 13.4 % (ref 11.5–15.5)
WBC: 5.9 10*3/uL (ref 4.0–10.5)
nRBC: 0.5 % — ABNORMAL HIGH (ref 0.0–0.2)

## 2019-11-27 LAB — CULTURE, BLOOD (ROUTINE X 2): Culture: NO GROWTH

## 2019-11-27 LAB — MAGNESIUM: Magnesium: 1.8 mg/dL (ref 1.7–2.4)

## 2019-11-27 MED ORDER — METOPROLOL SUCCINATE ER 25 MG PO TB24: 12.5000 mg | ORAL_TABLET | Freq: Every day | ORAL | 3 refills | Status: DC

## 2019-11-27 MED ORDER — POTASSIUM CHLORIDE CRYS ER 20 MEQ PO TBCR
40.0000 meq | EXTENDED_RELEASE_TABLET | Freq: Two times a day (BID) | ORAL | Status: DC
  Administered 2019-11-27 – 2019-11-28 (×3): 40 meq via ORAL
  Filled 2019-11-27 (×3): qty 2

## 2019-11-27 MED ORDER — CIPROFLOXACIN HCL 500 MG PO TABS: 500.0000 mg | ORAL_TABLET | Freq: Two times a day (BID) | ORAL | 0 refills | Status: DC

## 2019-11-27 MED ORDER — POTASSIUM CHLORIDE CRYS ER 20 MEQ PO TBCR: 40.0000 meq | EXTENDED_RELEASE_TABLET | Freq: Two times a day (BID) | ORAL | 0 refills | Status: DC

## 2019-11-27 MED ORDER — AMOXICILLIN 250 MG PO CAPS
500.0000 mg | ORAL_CAPSULE | Freq: Three times a day (TID) | ORAL | Status: DC
  Administered 2019-11-27 – 2019-11-28 (×5): 500 mg via ORAL
  Filled 2019-11-27 (×5): qty 2

## 2019-11-27 MED ORDER — AMOXICILLIN 500 MG PO CAPS: 500.0000 mg | ORAL_CAPSULE | Freq: Three times a day (TID) | ORAL | 0 refills | Status: DC

## 2019-11-27 MED ORDER — CIPROFLOXACIN HCL 500 MG PO TABS
500.0000 mg | ORAL_TABLET | Freq: Two times a day (BID) | ORAL | Status: DC
  Administered 2019-11-27 – 2019-11-28 (×3): 500 mg via ORAL
  Filled 2019-11-27 (×3): qty 1

## 2019-11-27 MED ORDER — SODIUM CHLORIDE 0.9 % IV SOLN
2.0000 g | Freq: Two times a day (BID) | INTRAVENOUS | Status: DC
  Filled 2019-11-27: qty 2

## 2019-11-27 NOTE — Consult Note (Signed)
[place] Nurse Consult Note: Reason for Consult:VAC dressing change to left lateral foot at fifth metatarsal.  Wound type:surgical/infectious Pressure Injury POA: NA Measurement: 5 cmx 6 cm x 0.3 cm  Wound KQA:SUORV red, friable Drainage (amount, consistency, odor) minimal serosanguinous  No odor.  Periwound:peeling epithelium Dressing procedure/placement/frequency:1 piece black foam to wound bed with 1 piece for bridge to left dorsal foot over protected skin. Change Tues/Thurs/Sat. Will follow.  Domenic Moras MSN, RN, FNP-BC CWON Wound, Ostomy, Continence Nurse Pager [phone]

## 2019-11-27 NOTE — TOC Progression Note (Signed)
Transition of Care [facility]) - Progression Note    Patient Details  Name: [name] [name]: [id number] Date of Birth: [birth year]  Transition of Care [facility]) CM/SW Contact  Dagon Budai, Juliann Pulse, RN Phone Number: 11/27/2019, 3:14 PM  Clinical Narrative: [employer] awaiting auth for wound vac approval prior delivery to rm.      Expected Discharge Plan: [place] [facility] Barriers to Discharge: Other (comment) ([facility] wound vac auth-delivery to rm)  Expected Discharge Plan and Services Expected Discharge Plan: [place]   Discharge Planning Services: CM Consult Post Acute Care Choice: Home Health, Durable Medical Equipment Living arrangements for the past 2 months: Single Family Home Expected Discharge Date: 11/27/19               DME Arranged: Harlin Heys DME Agency: [facility] Date DME Agency Contacted: 11/27/19 Time DME Agency Contacted: 66 Representative spoke with at DME Agency: Olivia Mackie HH Arranged: PT, RN [facility] Agency: [place] ([place]) Date [place]: 11/27/19 Time [place]: 88 Representative spoke with at [place]: [facility]   Social Determinants of Health ([place]) Interventions    Readmission Risk Interventions No flowsheet data found.

## 2019-11-27 NOTE — TOC Initial Note (Addendum)
Transition of Care [facility]) - Initial/Assessment Note    Patient Details  Name: [name] [name]: [id number] Date of Birth: 1945/04/16  Transition of Care [facility]) CM/SW Contact:    Dessa Phi, RN Phone Number: 11/27/2019, 12:33 PM  Clinical Narrative:[facility] for HHPT only-unable to get HHRN. No agency to accept for HHRN-[facility]Orthopedic to sign wound vac form for safe delivery from [facility] to patient's rm prior d/c. Patient will go to Wound care center M/W/F.Patient will get own 3n1 from retail store.                 Expected Discharge Plan: [place] [facility] Barriers to Discharge: Other (comment) ([employer] wound vac auth-delivery to rm)   Patient Goals and CMS Choice Patient states their goals for this hospitalization and ongoing recovery are:: go home CMS Medicare.gov Compare Post Acute Care list provided to:: Patient Choice offered to / list presented to : Patient  Expected Discharge Plan and Services Expected Discharge Plan: [place]   Discharge Planning Services: CM Consult Post Acute Care Choice: Home Health, Durable Medical Equipment Living arrangements for the past 2 months: Single Family Home Expected Discharge Date: 11/27/19               DME Arranged: Harlin Heys DME Agency: [facility] Date DME Agency Contacted: 11/27/19 Time DME Agency Contacted: 93 Representative spoke with at DME Agency: Reino Kent HHPT   Date [place]: 11/27/19 Time HH Agency Contacted: [facility] Arrangements/Services Living arrangements for the past 2 months: [place] with:: Spouse Patient language and need for interpreter reviewed:: Yes Do you feel safe going back to the place where you live?: Yes      Need for Family Participation in Patient Care: No (Comment) Care giver support system in place?: Yes (comment)   Criminal Activity/Legal Involvement Pertinent to Current  Situation/Hospitalization: No - Comment as needed  Activities of Daily Living Home Assistive Devices/Equipment: Dentures (specify type), Eyeglasses, Other (Comment) (glucometer) ADL Screening (condition at time of admission) Patient's cognitive ability adequate to safely complete daily activities?: Yes Is the patient deaf or have difficulty hearing?: No Does the patient have difficulty seeing, even when wearing glasses/contacts?: No Does the patient have difficulty concentrating, remembering, or making decisions?: No Patient able to express need for assistance with ADLs?: Yes Does the patient have difficulty dressing or bathing?: No Independently performs ADLs?: Yes (appropriate for developmental age) Does the patient have difficulty walking or climbing stairs?: Yes Weakness of Legs: Both Weakness of Arms/Hands: None  Permission Sought/Granted Permission sought to share information with : Case Manager Permission granted to share information with : Yes, Verbal Permission Granted  Share Information with NAME: Case Manager           Emotional Assessment Appearance:: Appears stated age Attitude/Demeanor/Rapport: Gracious Affect (typically observed): Accepting Orientation: : Oriented to Self, Oriented to Place, Oriented to  Time, Oriented to Situation Alcohol / Substance Use: Not Applicable Psych Involvement: No (comment)  Admission diagnosis:  Neutropenic fever ([place]) [D70.9, R50.81] Severe sepsis ([place]) [A41.9, R65.20] Cellulitis, unspecified cellulitis site [L03.90] Sepsis, due to unspecified organism, unspecified whether acute organ dysfunction present [facility]) [A41.9] Foot abscess, left [L02.612] Patient Active Problem List   Diagnosis Date Noted  . Foot abscess, left 11/23/2019  . Severe sepsis ([place]) 11/22/2019  . Neutropenic fever ([place]) 11/22/2019  . Acute renal failure superimposed on stage 3a chronic kidney disease ([place]) 11/22/2019  . Non-small cell carcinoma of  right lung,  stage 3 ([place]) 10/23/2019  . Goals of care, counseling/discussion 10/23/2019  . Encounter for antineoplastic chemotherapy 10/23/2019  . Lung mass 10/17/2019  . Small cell lung cancer ([place]) 10/12/2019  . Hemoptysis 10/12/2019  . PND (post-nasal drip) 09/20/2019  . Dropfoot 03/15/2019  . Mild cognitive impairment 05/08/2018  . Esophageal dysphagia   . Diabetic foot ulcer ([place]) 06/10/2017  . Uncontrolled type 2 diabetes mellitus with peripheral neuropathy ([place]) 05/11/2016  . Erectile dysfunction associated with type 2 diabetes mellitus ([place]) 05/28/2015  . Former very heavy cigarette smoker (more than 40 per day) 10/07/2014  . Chronic kidney disease, stage III (moderate) 12/19/2012  . Hypogonadism male 06/14/2012  . OSA (obstructive sleep apnea) 03/09/2011  . Diabetic neuropathy ([place]) 12/29/2010  . Family history of MI (myocardial infarction) 09/16/2010  . HIP REPLACEMENT, RIGHT, HX OF 02/05/2010  . Hypertriglyceridemia 06/13/2008  . GOUT 06/13/2008  . Major depressive disorder, recurrent episode, in partial remission ([place]) 06/13/2008  . Essential hypertension 06/13/2008  . Allergic rhinitis with postnasal drip 06/13/2008  . GERD 06/13/2008  . OSTEOARTHRITIS 06/13/2008   PCP:  [name], MD Pharmacy:   [id number] [place], [address] [place] [address] Phone: [phone] Fax: [phone]     Social Determinants of Health (SDOH) Interventions    Readmission Risk Interventions No flowsheet data found.

## 2019-11-27 NOTE — Progress Notes (Addendum)
Received a call back and text from [facility] stating wound vac was delivered today to [place] in case manager's office. Margarita Grizzle, [facility] went to case manager's office to look for the wound vac but was unsuccessful. Also Mariann Laster, case manager at [facility] was notified to assist with locating wound vac but was unsuccessful. Reached back out to Ms. Barrow to inform her patient still has not received wound vac and will be unable to discharge. Hospitalist on call was notified.

## 2019-11-27 NOTE — Discharge Summary (Addendum)
Physician Discharge Summary  [name] CBS:[id number] DOB: 08/31/1945 DOA: 11/22/2019  PCP: [name], MD  Admit date: 11/22/2019 Discharge date: 11/27/2019  Time spent: 20 minutes  Recommendations for Outpatient Follow-up:  1. Further outpatient wound management including vacuum versus dressings to be deferred to orthopedics-we will be going to wound center 2. I will make an outpatient referral to Dr. Meridee Score who should see the patient probably in the next week the number will be given to the patient for follow-up 3. Patient will complete Amoxil and Cipro as per [facility] and then be seen in the outpatient setting for these issues 4. Dr. Earlie Server his oncologist will be CCed on this note 5. Needs CBC Chem-12 1 week and further planning for radiation as per them 6. Check magnesium and replace if below 1.5  Wound care instructions as below Measurement: 5 cmx 6 cm x 0.3 cm  Wound WGY:KZLDJ red, friable Drainage (amount, consistency, odor) minimal serosanguinous  No odor.  Periwound:peeling epithelium Dressing procedure/placement/frequency:1 piece black foam to wound bed with 1 piece for bridge to left dorsal foot over protected skin. Change Tues/Thurs/Sat  Discharge Diagnoses:  Active Problems:   Hypertriglyceridemia   Essential hypertension   Chronic kidney disease, stage III (moderate)   Uncontrolled type 2 diabetes mellitus with peripheral neuropathy (HCC)   Small cell lung cancer ([place])   Severe sepsis (HCC)   Neutropenic fever ([place])   Acute renal failure superimposed on stage 3a chronic kidney disease ([place])   Foot abscess, left   Discharge Condition: Fair  Diet recommendation: Improved heart healthy  Filed Weights   11/22/19 2038 11/23/19 0434  Weight: 95.7 kg 83.6 kg    History of present illness:  28 white male Diagnosed NSCLC [currently carboplatin/etoposide + XRT]             critical obstruction bronchus intermedius resulting in            hemoptysis  hospitalized 7 7-10/18/2019-at that admission DKA- Underlying CKD 3b 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  Seen in  ED 11/15/2019 syncope x2 had a work-up with negative PE work-up orthostatics were positive he was given IV fluids and told to go home without his blood pressure meds and also had been seen recently at oncology office but did not mention worsening wounds in his left leg to them  return [facility] ED 11/22/2019 fever, chills tachycardic 150s, febrile 101.8 and found to have lower left extremity wound Lactic acid 3 White count 0.9  Orthopedics Dr. Rolena Infante saw the patient in consult after MRI and ABIs -I&D performed 8/13 and wound VAC was placed  Infectious disease saw the patient in consult and felt could use oral antibiotics  Hospital Course:  1. Severe sepsis (lactic acidosis AKI on admission ) 2/2 acute severe ulcers as well as infectious process with cellulitis of the left foot-underwent debridement 11/23/2019 of wound  a. Bolused 3 L in ED but sepsis physiology resolved b. Discussed with Dr. Rolena Infante who cleared the patient for discharge on 8/17 and will be followed by Dr. Sharol Given to review the wound from specialty perspective c. I have placed Dr. Jess Barters number in the chart for the patient and he will call Dr. Sharol Given and I will CC Dr. Sharol Given and Rolena Infante on this note for further follow-up d. Discharging on oral antibiotics Amoxil and Cipro as per ID instructions  on his consult dated 8/14 2. Likely hypovolemic hyponatremia 2/2 sepsis 3. Severe hypokalemia resolving now off IV supplementation 4. Hypomagnesemia resolved a. Hyponatremia slightly better b. Replacing orally with 40 of K. [place] and given a prescription on discharge as he persists and drinking fluids c. As an outpatient needs a magnesium 5. NSCLC with pancytopenia likely from infection and chemo 6. Expected  blood loss anemia from surgery and hemodilution a. [place] stabilized and he was taken off precautions b. Granix given for 3 days ending 8/15 c. Transfused 1 unit PRBC 8/14 with good response d. Will need further G-CSF support in the outpatient setting per oncologist 7. AKI on admission secondary to sepsis-baseline 1.3 a. Resolved and at baseline at this time b. Fluids discontinued 8/15 8. HTN 9. Persistent sinus tachycardia per patient since his cancer diagnosis a. Holding Cardura b. ST controlled controlled on metoprolol XL 12.5 c. D/c monitors 10. DM TY 2 A1c 10 indicating poor control-on 34-19?- F790 complicated by hypoglycemic to the 40s on 8/14 a. Continue U500 at doses of 15-15-15 in the hospital but can go back to his usual dosing at home as I expect he will be able to eat properly b. Blood sugars reasonably controlled 1 50-2 26 11. OSA on CPAP a. Not clear refuses will order for tonight 12. Asthma  Procedures:  Incision and drainage on 8/13 by Dr. Rolena Infante (i.e. Studies not automatically included, echos, thoracentesis, etc; not x-rays)  Consultations:    Discharge Exam: Vitals:   11/26/19 2133 11/27/19 0439  BP: (!) 160/83 (!) 156/85  Pulse: 97 99  Resp: 16 14  Temp: 98 F (36.7 C) 98 F (36.7 C)  SpO2: 98% 95%    General: Awake alert coherent no distress Cardiovascular: S1-S2 no murmur rub or gallop Respiratory: Clear no added sound Left foot wound has some bleeding there is no discharge however it does not appear to be serosanguineous pulses are good Nursing and Ortho will coordinate planning for dressings on discharge  Discharge Instructions   Discharge Instructions    Diet - low sodium heart healthy   Complete by: As directed    Discharge instructions   Complete by: As directed    Make sure that u complete the antibiotics as per discussions Will need to follow up with Dr. Sharol Given in the OP setting for possible further needs and I will place his number on the  chart so that you can contact him The wound VAC will be ordered by orthopedics and I am not sure if you can get a scooter the case management if this is approved can help with this otherwise you may have to get this out of pocket we can order a rolling walker for you instead   Increase activity slowly   Complete by: As directed    No wound care   Complete by: As directed    11/28/19 1000   11/28/19 1000   Negative pressure wound therapy  Every Mon-Wed-Fri 1000    Question:  Amount of suction?  Answer:  125 mm/Hg  11/27/19 1128  11/27/19 1000   Negative pressure wound therapy  Every Tue,Thu,Sat 1000     11/26/19 1705      Negative pressure wound therapy  Every Mon-Wed-Fri 1000    Question:  Amount of suction?  Answer:  125 mm/Hg  11/27/19 1128  11/27/19 1000   Negative pressure wound therapy  Every Tue,Thu,Sat 1000     11/26/19 1705     Allergies as  of 11/27/2019      Reactions   Tetracycline Itching, Rash      Medication List    STOP taking these medications   doxazosin 8 MG tablet Commonly known as: CARDURA     TAKE these medications   amoxicillin 500 MG capsule Commonly known as: AMOXIL Take 1 capsule (500 mg total) by mouth every 8 (eight) hours.   ampicillin 500 MG capsule Commonly known as: PRINCIPEN Take 500 mg by mouth daily as needed (flare up).   cholecalciferol 1000 units tablet Commonly known as: VITAMIN D Take 1,000 Units by mouth daily.   ciprofloxacin 500 MG tablet Commonly known as: CIPRO Take 1 tablet (500 mg total) by mouth 2 (two) times daily.   CLEAR EYES COMPLETE OP Place 1 drop into both eyes daily as needed (dry eyes).   donepezil 10 MG tablet Commonly known as: ARICEPT TAKE 1 TABLET BY MOUTH AT BEDTIME   fluticasone 50 MCG/ACT nasal spray Commonly known as: FLONASE PLACE 2 SPRAYS IN EACH NOSTRIL DAILY What changed:   how much to take  how to take this  when to take this  reasons to take this  additional  instructions   guaiFENesin-dextromethorphan 100-10 MG/5ML syrup Commonly known as: ROBITUSSIN DM Take 5 mLs by mouth every 6 (six) hours as needed for cough.   HumuLIN R U-500 KwikPen 500 UNIT/ML kwikpen Generic drug: insulin regular human CONCENTRATED Inject under skin 80-90 units before b'fast, 60 units before lunch, 80-90 units vefore dinner What changed:   how much to take  how to take this  when to take this  additional instructions   lidocaine-prilocaine cream Commonly known as: EMLA Apply 1 application topically as needed. What changed: reasons to take this   memantine 10 MG tablet Commonly known as: NAMENDA TAKE 1 TABLET BY MOUTH TWICE A DAY   metoprolol succinate 25 MG 24 hr tablet Commonly known as: TOPROL-XL Take 0.5 tablets (12.5 mg total) by mouth daily. Start taking on: November 28, 2019   montelukast 10 MG tablet Commonly known as: SINGULAIR Take 1 tablet (10 mg total) by mouth at bedtime.   omeprazole 40 MG capsule Commonly known as: PRILOSEC Take 1 capsule (40 mg total) by mouth 2 (two) times daily before a meal. What changed: when to take this   Pen Needles 31G X 5 MM Misc 1 each by Does not apply route 3 (three) times daily. To inject insulin E11.42   potassium chloride SA 20 MEQ tablet Commonly known as: KLOR-CON Take 2 tablets (40 mEq total) by mouth 2 (two) times daily.   prochlorperazine 10 MG tablet Commonly known as: COMPAZINE Take 1 tablet (10 mg total) by mouth every 6 (six) hours as needed. What changed: reasons to take this   QC Acetaminophen 8Hr Arth Pain 650 MG CR tablet Generic drug: acetaminophen Take 650 mg by mouth every 8 (eight) hours as needed for pain.   simvastatin 40 MG tablet Commonly known as: ZOCOR TAKE 1 TABLET BY MOUTH AT BEDTIME What changed: when to take this   sucralfate 1 g tablet Commonly known as: Carafate Take 1 tablet (1 g total) by mouth 4 (four) times daily. Dissolve each tablet in 15 cc water  before use.   venlafaxine XR 75 MG 24 hr capsule Commonly known as: EFFEXOR-XR TAKE 1 CAPSULE BY MOUTH EVERY MORNING AND 2 CAPSULES AT BEDTIME What changed: See the new instructions.   vitamin C 1000 MG tablet Take 1,000 mg by mouth daily.  Durable Medical Equipment  (From admission, onward)         Start     Ordered   11/27/19 1122  For home use only DME Other see comment  Once       Comments: scooter  Question:  Length of Need  Answer:  Lifetime   11/27/19 1121         Allergies  Allergen Reactions  . Tetracycline Itching and Rash    Follow-up Information    Newt Minion, MD. Schedule an appointment as soon as possible for a visit.   Specialty: Orthopedic Surgery Why: Call to set up appointment for later this week Contact information: [place] [address] [phone]        [facility] Follow up.   Specialty: Wound Care Why: Arrange follow up for [facility] cressing change on Thrusday Contact information: [address] [id number] [place] [phone]       [facility] Follow up.   Why: this is a rental-pick up in the store [address] information: [place] [address] [phone]                The results of significant diagnostics from this hospitalization (including imaging, microbiology, ancillary and laboratory) are listed below for reference.    Significant Diagnostic Studies: DG Chest 2 View  Result Date: 11/22/2019 CLINICAL DATA:  Suspected sepsis EXAM: CHEST - 2 VIEW COMPARISON:  10/12/2019, CT 11/15/2019 FINDINGS: No focal consolidation or pleural effusion. Normal heart size. Decreased right hilar mass. No pneumothorax. IMPRESSION: No acute airspace disease. Interval decrease in size of right hilar mass Electronically Signed   By: Donavan Foil M.D.   On: 11/22/2019 21:07   CT Angio Chest PE W/Cm &/Or Wo Cm  Result  Date: 11/15/2019 CLINICAL DATA:  Syncope. EXAM: CT ANGIOGRAPHY CHEST WITH CONTRAST TECHNIQUE: Multidetector CT imaging of the chest was performed using the standard protocol during bolus administration of intravenous contrast. Multiplanar CT image reconstructions and MIPs were obtained to evaluate the vascular anatomy. CONTRAST:  32mL OMNIPAQUE IOHEXOL 350 MG/ML SOLN COMPARISON:  CT chest dated October 11, 2019 FINDINGS: Cardiovascular: Contrast injection is sufficient to demonstrate satisfactory opacification of the pulmonary arteries to the segmental level. There is no pulmonary embolus or evidence of right heart strain. The size of the main pulmonary artery is normal. Heart size is normal, with no pericardial effusion. There are coronary artery calcifications. There are minimal atherosclerotic changes of the thoracic aorta without evidence for an aneurysm. There is no significant pericardial effusion. Mediastinum/Nodes: --again noted are pathologically enlarged mediastinal lymph nodes. The dominant lymph node measures approximately 2.4 x 4.3 cm (previously measuring 2.7 by 6 cm). --there are pathologically enlarged right hilar lymph nodes. -- No axillary lymphadenopathy. -- No supraclavicular lymphadenopathy. --there is a right-sided hypoattenuating thyroid nodule measuring approximately 1.5 cm (axial series 4, image 9). -  Unremarkable esophagus. Lungs/Pleura: Again noted is a right hilar mass. This mass has demonstrated significant interval decrease in size and currently measures approximately 4.7 x 4.8 cm (previously measuring approximately 6.1 x 5.8 cm). There is new cavitation within the central aspect of this mass. There is no pneumothorax. No large pleural effusion. Upper Abdomen: Contrast bolus timing is not optimized for evaluation of the abdominal organs. The patient is status post prior cholecystectomy. There is a probable nonobstructing stone in the upper pole the left kidney. There is variant hepatic  arterial  anatomy with a replaced common hepatic artery. Musculoskeletal: There are healing fractures of the anterior there through fifth ribs on the patient's left. There is no new acute displaced fracture. Review of the MIP images confirms the above findings. IMPRESSION: 1. No evidence for acute pulmonary embolism. 2. Findings consistent with a positive response to treatment as evidence by interval decrease in size of the right hilar mass and mediastinal lymph nodes. 3. Right-sided hypoattenuating thyroid nodule measuring approximately 1.5 cm. Outpatient follow-up ultrasound is recommended if it has not already been performed.(Ref: J Am Coll Radiol. 2015 Feb;12(2): 143-50). 4. Healing left-sided rib fractures. Aortic Atherosclerosis (ICD10-I70.0). Electronically Signed   By: Constance Holster M.D.   On: 11/15/2019 18:37   MR Brain W Wo Contrast  Result Date: 11/04/2019 CLINICAL DATA:  Non-small cell lung carcinoma staging EXAM: MRI HEAD WITHOUT AND WITH CONTRAST TECHNIQUE: Multiplanar, multiecho pulse sequences of the brain and surrounding structures were obtained without and with intravenous contrast. CONTRAST:  20mL GADAVIST GADOBUTROL 1 MMOL/ML IV SOLN COMPARISON:  None. FINDINGS: BRAIN: No acute infarct, acute hemorrhage or extra-axial collection. Multifocal white matter hyperintensity, most commonly due to chronic ischemic microangiopathy. There is generalized atrophy without lobar predilection. No chronic microhemorrhage. Normal midline structures. There is no abnormal contrast enhancement. VASCULAR: Major flow voids are preserved. SKULL AND UPPER CERVICAL SPINE: Normal calvarium and skull base. Visualized upper cervical spine and soft tissues are normal. SINUSES/ORBITS: No paranasal sinus fluid levels or advanced mucosal thickening. No mastoid or middle ear effusion. Normal orbits. IMPRESSION: 1. No intracranial metastatic disease. 2. Generalized atrophy and chronic ischemic microangiopathy.  Electronically Signed   By: Ulyses Jarred M.D.   On: 11/04/2019 20:56   MR FOOT LEFT W WO CONTRAST  Result Date: 11/23/2019 CLINICAL DATA:  Chronic left foot ulcer with erythema and recent drainage. History of lung cancer. Concern for osteomyelitis. EXAM: MRI OF THE LEFT FOREFOOT WITHOUT AND WITH CONTRAST TECHNIQUE: Multiplanar, multisequence MR imaging of the left forefoot was performed both before and after administration of intravenous contrast. CONTRAST:  26mL GADAVIST GADOBUTROL 1 MMOL/ML IV SOLN COMPARISON:  Radiographs 11/22/2019 and 02/12/2019 FINDINGS: Bones/Joint/Cartilage There is soft tissue ulceration along the plantar aspect of the midfoot at the level of the 4th and 5th metatarsal bases. There appears to be mild soft tissue ulceration along the plantar aspect of the 5th metatarsal head as well. No underlying cortical destruction, marrow edema or suspicious marrow enhancement. There are mild midfoot and 1st MTP degenerative changes without significant joint effusions. The tibial sesamoid of the 1st metatarsal is bipartite. The distal toes are incompletely visualized. Possible nondisplaced transverse fracture through the base of the 5th proximal phalanx. Ligaments Intact Lisfranc ligament. 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 No focal intramuscular fluid collection or tenosynovitis. The ankle tendons appear intact. Soft tissues As above, apparent soft tissue ulceration along the plantar aspect of the 4th and 5th metatarsal bases and the 5th metatarsal head. There is associated heterogeneous enhancement following contrast, but no evidence of drainable fluid collection or foreign body. IMPRESSION: 1. Soft tissue ulceration along the plantar aspect of the midfoot at the level of the 4th and 5th metatarsal bases and the 5th metatarsal head. No evidence of drainable fluid collection or foreign body. 2. No evidence of osteomyelitis. 3.  Possible nondisplaced transverse fracture through the base of the 5th proximal phalanx. Electronically Signed   By: Richardean Sale M.D.   On: 11/23/2019 16:31  NM PET Image Initial (PI) Skull Base To Thigh  Result Date: 11/01/2019 CLINICAL DATA:  Initial treatment strategy for lung cancer. EXAM: NUCLEAR MEDICINE PET SKULL BASE TO THIGH TECHNIQUE: 9.9 mCi F-18 FDG was injected intravenously. Full-ring PET imaging was performed from the skull base to thigh after the radiotracer. CT data was obtained and used for attenuation correction and anatomic localization. Fasting blood glucose: 342 mg/dl COMPARISON:  10/11/2019 FINDINGS: Mediastinal blood pool activity: SUV max 3.42 Liver activity: SUV max NA Note: Patient presents today with abnormally elevated fasting blood glucose at 342. Exam was attempted yesterday however the fasting blood glucose was even higher. Patient reports difficulty with achieving a normal blood glucose level and therefore the decision was made to proceed with the exam knowing that a markedly elevated fasting blood sugar may reduce sensitivity of this exam as the radiotracer is an analog of blood glucose. Neck: No FDG avid mass or adenopathy identified. Incidental CT findings: None CHEST: Superior segment of right lower lobe perihilar lung mass is again noted. The mass appears internally necrotic with a maximum dimension of 6.4 cm with SUV max of 4.6, image 76/4. The tumor invades the mediastinum and has mass effect the posterior wall of the right mainstem bronchus. Encasement and narrowing of the proximal right upper lobe bronchus and bronchus intermedius noted. Signs of postobstructive pneumonitis versus lymphangitic spread of disease is noted within the posteromedial right upper lobe, image 67/4. Small subpleural nodule is identified within the posterior aspect of the superior segment of right lower lobe measuring 10 mm. Mild FDG uptake with SUV max of 1.3 is associated with this nodule,  image 84/4. Sub solid, cystic lesion within the lateral right lung base measures approximately 1 cm without significant FDG uptake. No suspicious pulmonary nodule or mass within the contralateral lung. Large subcarinal lymph node has a short axis of 2.3 cm and an SUV max of 3.7. Right paratracheal lymph node measures 2.8 cm and appears predominantly necrotic within SUV max of 2.7. Adjacent right paratracheal lymph node measures 1.5 cm with SUV max of 3.7. No left-sided mediastinal or hilar adenopathy. No FDG avid supraclavicular or axillary lymph nodes. Incidental CT findings: Aortic atherosclerosis. Lad and RCA coronary artery calcifications. ABDOMEN/PELVIS: No FDG uptake within the liver, pancreas, spleen, or adrenal glands. No FDG avid lymph nodes within the abdomen or pelvis. Incidental CT findings: Cholecystectomy. Aortic atherosclerosis. 3 mm stone is identified within upper pole of left kidney. 3 cm cyst arises from the lateral cortex of the right kidney. SKELETON: No focal hypermetabolic activity to suggest skeletal metastasis. Incidental CT findings: Previous right hip arthroplasty. Posttraumatic rib deformities are noted involving the anterior aspect of the left third and fourth ribs with expected mild FDG uptake. IMPRESSION: 1. Note: Patient presented for the second day in a row with markedly elevated fasting blood sugars. The radiotracer is a glucose analog and ideally the exam should be performed with a normal fasting blood glucose level as elevated blood glucose may diminish the sensitivity for detecting FDG avid disease. 2. FDG avid necrotic perihilar lung mass involving the superior segment of right lower lobe is identified compatible with primary bronchogenic carcinoma. 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 Imaging findings are compatible with at least a T3N2M0 lesion, stage IIIb disease. 3. Involvement of the right mainstem bronchus, right upper  lobe bronchi and bronchus intermedius noted. Postobstructive pneumonitis versus lymphangitic spread of disease is noted within the central portion of the  posteromedial right upper lobe. 4. Enlarged FDG avid lymph nodes are identified within the right paratracheal nodal chain and subcarinal region. Dominant right paratracheal lymph node is centrally necrotic with expected decreased FDG uptake. 5. There are 2 small subpleural nodules within the posterior right lower lobe. Cannot rule out satellite lesions. 6. Sub solid, cystic nodule is noted within the lateral right lung base. No significant tracer uptake associated with this nodule. 7. No signs of metastatic disease to the abdomen or pelvis or skeletal structures. 8. Subacute, posttraumatic rib deformities are noted involving the anterior aspect of the left third and fourth ribs 9. Aortic Atherosclerosis (ICD10-I70.0) and Emphysema (ICD10-J43.9). 10. Multi vessel coronary artery atherosclerotic calcifications. Electronically Signed   By: Kerby Moors M.D.   On: 11/01/2019 10:56   DG CHEST PORT 1 VIEW  Result Date: 11/26/2019 CLINICAL DATA:  Foot ulcer.  Sepsis. EXAM: PORTABLE CHEST 1 VIEW COMPARISON:  11/22/2019 FINDINGS: The cardiac silhouette, mediastinal and hilar contours are within normal limits and stable. Stable radiation changes are noted in the right hilum/paramediastinal lung. No acute pulmonary findings or pleural effusions. No worrisome pulmonary lesions. The bony thorax is intact. IMPRESSION: 1. Stable radiation changes in the right hilum/paramediastinal lung. 2. No acute cardiopulmonary findings. Electronically Signed   By: Marijo Sanes M.D.   On: 11/26/2019 15:45   DG Foot Complete Left  Result Date: 11/22/2019 CLINICAL DATA:  Foot pain EXAM: LEFT FOOT - COMPLETE 3+ VIEW COMPARISON:  02/12/2019 FINDINGS: No acute displaced fracture or malalignment. Soft tissue ulceration at the mid sole of the foot. No radiopaque foreign body. Small plantar  calcaneal spur. No osseous destructive change. Mild degenerative change at the first MTP joint. IMPRESSION: No acute osseous abnormality. Soft tissue ulceration at the mid sole of the foot. Electronically Signed   By: Donavan Foil M.D.   On: 11/22/2019 21:38   VAS [place] ABI WITH/WO TBI  Result Date: 11/24/2019 LOWER EXTREMITY DOPPLER STUDY Indications: Ulceration of LT foot. High Risk Factors: Hypertension, Diabetes.  Comparison Study: No prior studies. Performing Technologist: Darlin Coco  Examination Guidelines: A complete evaluation includes at minimum, Doppler waveform signals and systolic blood pressure reading at the level of bilateral brachial, anterior tibial, and posterior tibial arteries, when vessel segments are accessible. Bilateral testing is considered an integral part of a complete examination. Photoelectric Plethysmograph (PPG) waveforms and toe systolic pressure readings are included as required and additional duplex testing as needed. Limited examinations for reoccurring indications may be performed as noted.  ABI Findings: +---------+------------------+-----+---------+--------+ Right    Rt Pressure (mmHg)IndexWaveform Comment  +---------+------------------+-----+---------+--------+ Brachial 154                    triphasic         +---------+------------------+-----+---------+--------+ PTA      161               1.05 triphasic         +---------+------------------+-----+---------+--------+ DP       143               0.93 triphasic         +---------+------------------+-----+---------+--------+ Great Toe100               0.65 Abnormal          +---------+------------------+-----+---------+--------+ +---------+------------------+-----+---------+-------+ Left     Lt Pressure (mmHg)IndexWaveform Comment +---------+------------------+-----+---------+-------+ Brachial 152                    triphasic         +---------+------------------+-----+---------+-------+  PTA      155               1.01 triphasic        +---------+------------------+-----+---------+-------+ DP       114               0.74 triphasic        +---------+------------------+-----+---------+-------+ Great Toe120               0.78 Normal           +---------+------------------+-----+---------+-------+ +-------+-----------+-----------+------------+------------+ ABI/TBIToday's ABIToday's TBIPrevious ABIPrevious TBI +-------+-----------+-----------+------------+------------+ Right  1.05       0.65                                +-------+-----------+-----------+------------+------------+ Left   1.01       0.78                                +-------+-----------+-----------+------------+------------+  Summary: Right: Resting right ankle-brachial index is within normal range. No evidence of significant right lower extremity arterial disease. The right toe-brachial index is mildly abnormal. Left: Resting left ankle-brachial index is within normal range. No evidence of significant left lower extremity arterial disease. The left toe-brachial index is normal.  *See table(s) above for measurements and observations.  Electronically signed by Ruta Hinds MD on 11/24/2019 at 12:11:40 PM.    Final     Microbiology: Recent Results (from the past 240 hour(s))  Culture, blood (Routine x 2)     Status: None   Collection Time: 11/22/19  9:14 PM   Specimen: BLOOD  Result Value Ref Range Status   Specimen Description   Final    BLOOD RIGHT ANTECUBITAL Performed at [facility], [place] [address], [place], [place] [address]    Special Requests   Final    BOTTLES DRAWN AEROBIC AND ANAEROBIC Blood Culture results may not be optimal due to an excessive volume of blood received in culture bottles Performed at [place] [address] [place], [place] [address]    Culture   Final    NO  GROWTH 5 DAYS Performed at [facility], [place] [address], [place], [place] [address]    Report Status 11/27/2019 FINAL  Final  SARS Coronavirus 2 by RT PCR (hospital order, performed in [facility] hospital lab) Nasopharyngeal Nasopharyngeal Swab     Status: None   Collection Time: 11/22/19 10:35 PM   Specimen: Nasopharyngeal Swab  Result Value Ref Range Status   SARS Coronavirus 2 NEGATIVE NEGATIVE Final    Comment: (NOTE) SARS-CoV-2 target nucleic acids are NOT DETECTED.  The SARS-CoV-2 RNA is generally detectable in upper and lower respiratory specimens during the acute phase of infection. The lowest concentration of SARS-CoV-2 viral copies this assay can detect is 250 copies / mL. A negative result does not preclude SARS-CoV-2 infection and should not be used as the sole basis for treatment or other patient management decisions.  A negative result may occur with improper specimen collection / handling, submission of specimen other than nasopharyngeal swab, presence of viral mutation(s) within the areas targeted by this assay, and inadequate number of viral copies (<250 copies / mL). A negative result must be combined with clinical observations, patient history, and epidemiological information.  Fact Sheet for Patients:   StrictlyIdeas.no  Fact Sheet for Healthcare Providers:  BankingDealers.co.za  This test is not yet approved or  cleared by the [place] and has been authorized for detection and/or diagnosis of SARS-CoV-2 by FDA under an Emergency Use Authorization (EUA).  This EUA will remain in effect (meaning this test can be used) for the duration of the COVID-19 declaration under Section 564(b)(1) of the Act, 21 U.S.C. section 360bbb-3(b)(1), unless the authorization is terminated or revoked sooner.  Performed at [facility], [place] [address] [place], [place] [address]   Culture, blood  (Routine x 2)     Status: None (Preliminary result)   Collection Time: 11/23/19  2:33 AM   Specimen: BLOOD  Result Value Ref Range Status   Specimen Description   Final    BLOOD LEFT ANTECUBITAL Performed at [place] [address] [place], [place] [address]    Special Requests   Final    BOTTLES DRAWN AEROBIC AND ANAEROBIC Blood Culture results may not be optimal due to an excessive volume of blood received in culture bottles Performed at [place] [address], [place], [place] [address]    Culture   Final    NO GROWTH 4 DAYS Performed at [facility], [place] [address] [place], [place] [address]    Report Status PENDING  Incomplete  Urine culture     Status: None   Collection Time: 11/23/19  2:33 AM   Specimen: In/Out Cath Urine  Result Value Ref Range Status   Specimen Description   Final    IN/OUT CATH URINE Performed at [place] [address] [place], [place] [address]    Special Requests   Final    NONE Performed at [facility], [place] [address] [place], [place] [address]    Culture   Final    NO GROWTH Performed at [facility], [place] [address], [place],  [address]    Report Status 11/23/2019 FINAL  Final  Aerobic/Anaerobic Culture (surgical/deep wound)     Status: None (Preliminary result)   Collection Time: 11/23/19  6:00 PM   Specimen: Abscess  Result Value Ref Range Status   Specimen Description ABSCESS LEFT FOOT  Final   Special Requests NONE  Final   Gram Stain NO WBC SEEN NO ORGANISMS SEEN   Final   Culture   Final    NO GROWTH 3 DAYS NO ANAEROBES ISOLATED; CULTURE IN PROGRESS FOR 5 DAYS Performed at [facility], [place] [address] [place],  [address]    Report Status PENDING  Incomplete     Labs: Basic Metabolic Panel: Recent Labs  Lab 11/23/19 0233 11/24/19 0515 11/25/19 0527 11/26/19 0531 11/27/19 0556  NA 132* 124* 137  135 132*  K 3.7 2.8* 3.8 3.1* 3.2*  CL 95* 93* 96* 94* 93*  CO2 23 23 30 30 30   GLUCOSE 295* 259* 237* 221* 282*  BUN 25* 15 11 10 14   CREATININE 1.71* 1.24 1.37* 1.07 1.20  CALCIUM 8.0* 7.3* 8.3* 8.1* 8.1*  MG 1.4* 1.4* 1.5*  --  1.8   Liver Function Tests: Recent Labs  Lab 11/23/19 0233 11/24/19 0515 11/25/19 0527 11/26/19 0531 11/27/19 0556  AST 20 16 15 15  13*  ALT 15 14 16 15 13   ALKPHOS 71 66 76 74 87  BILITOT 1.3* 0.5 0.9 1.0 0.6  PROT 5.6* 5.2* 6.0* 5.5* 5.7*  ALBUMIN 2.6* 2.3* 2.9* 2.6* 2.6*   No results for input(s):  LIPASE, AMYLASE in the last 168 hours. No results for input(s): AMMONIA in the last 168 hours. CBC: Recent Labs  Lab 11/22/19 2115 11/22/19 2115 11/23/19 0233 11/23/19 0233 11/24/19 0515 11/24/19 2240 11/25/19 0527 11/26/19 0531 11/27/19 0556  WBC 0.9*   < > 0.9*  --  1.7*  --  5.2 4.7 5.9  NEUTROABS 0.6*  --   --   --  1.2*  --  3.4 3.8 4.1  HGB 9.8*   < > 8.0*   < > 7.0* 8.3* 9.0* 8.8* 9.1*  HCT 28.2*   < > 23.5*   < > 20.5* 23.8* 26.8* 25.8* 26.6*  MCV 79.9*   < > 81.0  --  81.0  --  82.5 80.9 81.6  PLT 54*   < > 34*  --  31*  --  43* 52* 69*   < > = values in this interval not displayed.   Cardiac Enzymes: No results for input(s): CKTOTAL, CKMB, CKMBINDEX, TROPONINI in the last 168 hours. BNP: BNP (last 3 results) No results for input(s): BNP in the last 8760 hours.  ProBNP (last 3 results) No results for input(s): PROBNP in the last 8760 hours.  CBG: Recent Labs  Lab 11/26/19 1206 11/26/19 1603 11/26/19 2139 11/27/19 0004 11/27/19 0728  GLUCAP 223* 136* 84 117* 301*       Signed:  Nita Sells MD   [facility] 11/27/2019, 11:41 AM

## 2019-11-27 NOTE — Progress Notes (Signed)
Received proof of delivery fax form from [place] but patient's equipment has not been delivered to patient. This RN called and left Vmail for Ms. Barrow and sent a text requesting a call back regarding estimated delivery time.

## 2019-11-27 NOTE — Care Management (Signed)
[facility] ED CM received call from [place] RN at [facility] concerning home wound vac, patient  Pending discharge on the   Delivery of home use wound vac from [employer].  Nurse states, she was told by [place] liaison with [employer] that delivery was verified to [place] office, [facility] unable to locate the equipment will follow up with this in the am.

## 2019-11-27 NOTE — Progress Notes (Signed)
    Subjective: 4 Days Post-Op Procedure(s) (LRB): IRRIGATION AND DEBRIDEMENT ABSCESS (Left) Patient reports pain as 0 on 0-10 scale.   Denies CP or SOB.  Voiding without difficulty. Positive flatus. Objective: Vital signs in last 24 hours: Temp:  [97.5 F (36.4 C)-98 F (36.7 C)] 98 F (36.7 C) (08/17 0439) Pulse Rate:  [91-101] 99 (08/17 0439) Resp:  [14-16] 14 (08/17 0439) BP: (145-160)/(77-95) 156/85 (08/17 0439) SpO2:  [95 %-100 %] 95 % (08/17 0439)  Intake/Output from previous day: 08/16 0701 - 08/17 0700 In: 1418 [P.O.:1318; IV Piggyback:100] Out: 2850 [Urine:2850] Intake/Output this shift: No intake/output data recorded.  Labs: Recent Labs    11/24/19 2240 11/25/19 0527 11/26/19 0531 11/27/19 0556  HGB 8.3* 9.0* 8.8* 9.1*   Recent Labs    11/26/19 0531 11/27/19 0556  WBC 4.7 5.9  RBC 3.19* 3.26*  HCT 25.8* 26.6*  PLT 52* 69*   Recent Labs    11/26/19 0531 11/27/19 0556  NA 135 132*  K 3.1* 3.2*  CL 94* 93*  CO2 30 30  BUN 10 14  CREATININE 1.07 1.20  GLUCOSE 221* 282*  CALCIUM 8.1* 8.1*   No results for input(s): LABPT, INR in the last 72 hours.  Physical Exam: ABD soft Intact pulses distally Dorsiflexion/Plantar flexion intact Compartment soft Wound VAC is functioning well. Body mass index is 25.7 kg/m.   Assessment/Plan: 4 Days Post-Op Procedure(s) (LRB): IRRIGATION AND DEBRIDEMENT ABSCESS (Left) 1.  We will have the wound care service change the [facility] today. 2.  Patient can be discharged per the medical team.  I will defer antibiotic treatment/regimen to the ID and medical team. 3.  Patient will follow-up with Dr. Sharol Given later this week to discuss more definitive treatment plans for the wound. 4.  We will also make sure the patient continues care at the wound care clinic. 5.  Any questions or problems arise please do not hesitate to contact me otherwise I will sign off at this point.  Dahlia Bailiff for Dr. Melina Schools [facility] [phone] 11/27/2019, 7:52 AM

## 2019-11-27 NOTE — Progress Notes (Signed)
Pharmacy Antibiotic Note  [name] is a 74 y.o. male admitted on 11/22/2019 with sepsis. S/p I&D L foot abscess on 11/23/2019. Pharmacy has been consulted for Cefepime dosing.  Plan:  Adjust Cefepime back to 2 g iv q 12 hours. Would recommend keeping this dose despite fluctuations in SCr. In the setting of CKDIII and chronic severe illness, doubt SCr accurately depicts GFR though UOP good with noted planned change to PO as below Per ID notes, plan to discharge on Cipro + Amoxicillin (end date ~ 11/29/2019)  Height: 5\' 11"  (180.3 cm) Weight: 83.6 kg (184 lb 4.8 oz) IBW/kg (Calculated) : 75.3  Temp (24hrs), Avg:97.8 F (36.6 C), Min:97.5 F (36.4 C), Max:98 F (36.7 C)  Recent Labs  Lab 11/22/19 2115 11/22/19 2115 11/23/19 0233 11/24/19 0515 11/25/19 0527 11/26/19 0531 11/27/19 0556  WBC 0.9*   < > 0.9* 1.7* 5.2 4.7 5.9  CREATININE 1.78*   < > 1.71* 1.24 1.37* 1.07 1.20  LATICACIDVEN 3.0*  --  1.0  --   --   --   --    < > = values in this interval not displayed.    Estimated Creatinine Clearance: 57.5 mL/min (by C-G formula based on SCr of 1.2 mg/dL).    Allergies  Allergen Reactions  . Tetracycline Itching and Rash    Antimicrobials this admission:  8/12 Zosyn x 1 8/12 Vancomycin >> 8/14 8/13 Cefepime >>  8/13 Metronidazole >> 8/13  Microbiology results:  8/12 BCx: NGTD 8/13 BCx: NGTD 8/13 UCx: NGF 8/13 L foot abscess: NGTD  Thank you for allowing pharmacy to be a part of this patient's care.  Ulice Dash, PharmD, BCPS [phone] 11/27/2019 7:20 AM

## 2019-11-28 ENCOUNTER — Ambulatory Visit
Admission: RE | Admit: 2019-11-28 | Discharge: 2019-11-28 | Disposition: A | Discharge status: Home / Self Care | Payer: Medicare Other | Source: Ambulatory Visit | Attending: Radiation Oncology | Admitting: Radiation Oncology

## 2019-11-28 ENCOUNTER — Encounter: Payer: Self-pay | Admitting: Radiation Oncology

## 2019-11-28 DIAGNOSIS — T451X5A Adverse effect of antineoplastic and immunosuppressive drugs, initial encounter: Secondary | ICD-10-CM

## 2019-11-28 DIAGNOSIS — E1142 Type 2 diabetes mellitus with diabetic polyneuropathy: Secondary | ICD-10-CM

## 2019-11-28 DIAGNOSIS — E876 Hypokalemia: Secondary | ICD-10-CM

## 2019-11-28 DIAGNOSIS — I10 Essential (primary) hypertension: Secondary | ICD-10-CM

## 2019-11-28 DIAGNOSIS — C3431 Malignant neoplasm of lower lobe, right bronchus or lung: Secondary | ICD-10-CM | POA: Diagnosis not present

## 2019-11-28 DIAGNOSIS — D701 Agranulocytosis secondary to cancer chemotherapy: Secondary | ICD-10-CM

## 2019-11-28 DIAGNOSIS — C3490 Malignant neoplasm of unspecified part of unspecified bronchus or lung: Secondary | ICD-10-CM

## 2019-11-28 DIAGNOSIS — L0390 Cellulitis, unspecified: Secondary | ICD-10-CM

## 2019-11-28 DIAGNOSIS — R000 Tachycardia, unspecified: Secondary | ICD-10-CM

## 2019-11-28 DIAGNOSIS — E871 Hypo-osmolality and hyponatremia: Secondary | ICD-10-CM

## 2019-11-28 DIAGNOSIS — G4733 Obstructive sleep apnea (adult) (pediatric): Secondary | ICD-10-CM

## 2019-11-28 DIAGNOSIS — E1165 Type 2 diabetes mellitus with hyperglycemia: Secondary | ICD-10-CM

## 2019-11-28 LAB — GLUCOSE, CAPILLARY
Glucose-Capillary: 259 mg/dL — ABNORMAL HIGH (ref 70–99)
Glucose-Capillary: 273 mg/dL — ABNORMAL HIGH (ref 70–99)

## 2019-11-28 LAB — CBC WITH DIFFERENTIAL/PLATELET
Abs Immature Granulocytes: 0.58 10*3/uL — ABNORMAL HIGH (ref 0.00–0.07)
Basophils Absolute: 0.1 10*3/uL (ref 0.0–0.1)
Basophils Relative: 1 %
Eosinophils Absolute: 0 10*3/uL (ref 0.0–0.5)
Eosinophils Relative: 0 %
HCT: 27 % — ABNORMAL LOW (ref 39.0–52.0)
Hemoglobin: 9.1 g/dL — ABNORMAL LOW (ref 13.0–17.0)
Immature Granulocytes: 9 %
Lymphocytes Relative: 7 %
Lymphs Abs: 0.5 10*3/uL — ABNORMAL LOW (ref 0.7–4.0)
MCH: 27.7 pg (ref 26.0–34.0)
MCHC: 33.7 g/dL (ref 30.0–36.0)
MCV: 82.3 fL (ref 80.0–100.0)
Monocytes Absolute: 1.1 10*3/uL — ABNORMAL HIGH (ref 0.1–1.0)
Monocytes Relative: 17 %
Neutro Abs: 4.4 10*3/uL (ref 1.7–7.7)
Neutrophils Relative %: 66 %
Platelets: 96 10*3/uL — ABNORMAL LOW (ref 150–400)
RBC: 3.28 MIL/uL — ABNORMAL LOW (ref 4.22–5.81)
RDW: 13.5 % (ref 11.5–15.5)
WBC: 6.7 10*3/uL (ref 4.0–10.5)
nRBC: 0 % (ref 0.0–0.2)

## 2019-11-28 LAB — COMPREHENSIVE METABOLIC PANEL
ALT: 12 U/L (ref 0–44)
AST: 11 U/L — ABNORMAL LOW (ref 15–41)
Albumin: 2.6 g/dL — ABNORMAL LOW (ref 3.5–5.0)
Alkaline Phosphatase: 81 U/L (ref 38–126)
Anion gap: 10 (ref 5–15)
BUN: 15 mg/dL (ref 8–23)
CO2: 30 mmol/L (ref 22–32)
Calcium: 8.5 mg/dL — ABNORMAL LOW (ref 8.9–10.3)
Chloride: 93 mmol/L — ABNORMAL LOW (ref 98–111)
Creatinine, Ser: 1.31 mg/dL — ABNORMAL HIGH (ref 0.61–1.24)
GFR calc Af Amer: >60 mL/min (ref >60)
GFR calc non Af Amer: 53 mL/min — ABNORMAL LOW (ref >60)
Glucose, Bld: 269 mg/dL — ABNORMAL HIGH (ref 70–99)
Potassium: 4 mmol/L (ref 3.5–5.1)
Sodium: 133 mmol/L — ABNORMAL LOW (ref 135–145)
Total Bilirubin: 0.8 mg/dL (ref 0.3–1.2)
Total Protein: 5.8 g/dL — ABNORMAL LOW (ref 6.5–8.1)

## 2019-11-28 LAB — AEROBIC/ANAEROBIC CULTURE W GRAM STAIN (SURGICAL/DEEP WOUND)
Culture: NO GROWTH
Gram Stain: NONE SEEN

## 2019-11-28 LAB — CULTURE, BLOOD (ROUTINE X 2): Culture: NO GROWTH

## 2019-11-28 NOTE — TOC Transition Note (Signed)
Transition of Care [facility]) - CM/SW Discharge Note   Patient Details  Name: AHMAN DUGDALE [name]: [id number] Date of Birth: 1945/07/05  [facility] CM/SW Contact:  Dessa Phi, RN Phone Number: 11/28/2019, 9:35 AM   Clinical Narrative:   Patient has received [employer] wound vac they has been authorized-patient has signed form of delivery to rm. No further CM needs.      Barriers to Discharge: Other (comment) ([employer] wound vac auth-delivery to rm)   Patient Goals and CMS Choice Patient states their goals for this hospitalization and ongoing recovery are:: go home CMS Medicare.gov Compare Post Acute Care list provided to:: Patient Choice offered to / list presented to : Patient  Discharge Placement                       Discharge Plan and Services   Discharge Planning Services: CM Consult Post Acute Care Choice: Home Health, Durable Medical Equipment          DME Arranged: Vac DME Agency: [employer] Date DME Agency Contacted: 11/27/19 Time DME Agency Contacted: 52 Representative spoke with at DME Agency: Olivia Mackie HH Arranged: PT, RN [facility] Agency: [place] ([place]) Date [place]: 11/27/19 Time [place]: 71 Representative spoke with at [place]: [facility]  Social Determinants of Health ([place]) Interventions     Readmission Risk Interventions No flowsheet data found.

## 2019-11-28 NOTE — Progress Notes (Signed)
PROGRESS NOTE    [name]  VQM:[id number] DOB: Oct 23, 1945 DOA: 11/22/2019 PCP: [name], MD    Chief Complaint  Patient presents with  . Fever  . 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  Seen in  ED 11/15/2019 syncope x2 had a work-up with negative PE work-up orthostatics were positive he was given IV fluids and told to go home without his blood pressure meds and also had been seen recently at oncology office but did not mention worsening wounds in his left leg to them  return [facility] 11/22/2019 fever, chills tachycardic 150s, febrile 101.8 and found to have lower left extremity wound Lactic acid 3 White count 0.9  Orthopedics Dr. Rolena Infante saw the patient in consult after MRI and ABIs -I&D performed 8/13 and wound VAC was placed  Infectious disease saw the patient in consult and felt could use oral antibiotics   Assessment & Plan:   Active Problems:   Hypertriglyceridemia   Essential hypertension   Chronic kidney disease, stage III (moderate)   Uncontrolled type 2 diabetes mellitus with peripheral neuropathy (HCC)   Hyponatremia   Small cell lung cancer (HCC)   Severe sepsis (HCC)   Neutropenic fever (HCC)   Acute renal failure superimposed on stage 3a chronic kidney disease (HCC)   Foot abscess, left   Cellulitis   Chemotherapy induced neutropenia (HCC)   Hypokalemia   Hypomagnesemia   Sinus tachycardia  1 severe sepsis secondary to acute severe ulcers as well as infectious process with left foot cellulitis Patient met criteria for sepsis on admission with lactic acidosis,  acute kidney injury, cellulitis of the left foot. Patient status post debridement 11/23/2018 one of the wound. Patient received fluid bolus 3 L in the ED with resolution of sepsis physiology. Patient seen by orthopedics Dr. Rolena Infante and patient subsequently underwent debridement and wound VAC placement 11/23/2019. Patient cleared by orthopedics for discharge. Patient to follow-up with Dr. Baker Janus in the outpatient setting. Patient was on IV antibiotics and transition to oral amoxicillin and ciprofloxacin on discharge for five more days post discharge. Outpatient follow-up.  2. Hypovolemic hyponatremia Improved with hydration. Outpatient follow-up.  3. Severe hypokalemia Repleted.  4. Hypomagnesemia Repleted.  5. Non-small cell lung cancer with pancytopenia Likely secondary to infection and chemotherapy. Status post Granix x3 days during the hospitalization. Status post transfusion 1 unit packed red blood cells 11/24/2019. Counts improved. Hemoglobin stable at 9.1. Platelet count improved and currently at 96. White count at 6.7. Outpatient follow-up.  6. Postop acute blood loss anemia Status post transfusion 1 unit packed red blood cells. Hemoglobin currently stable at 9.1. Outpatient follow-up.  7. Acute kidney injury Felt secondary to sepsis. Resolved. Renal function at baseline. Outpatient follow-up.  8. Hypertension Stable.  9. Sinus tachycardia Improved on Toprol-XL. Follow.  10. Diabetes mellitus type 2 Hemoglobin A1c 10.0. Continue current doses of U500. Insulin. Outpatient follow-up.  11. OSA CPAP nightly.   DVT prophylaxis: SCDs Code Status: Full Family Communication: Updated patient. No family at bedside. Disposition:   Status is: Inpatient  Dispo:  Patient From: Home  Planned Disposition: Home  Expected discharge date: Today  medically stable for discharge: Yes        Consultants:   Infectious disease: Dr.Comer, 11/24/2019  Orthopedics: Dr.  Rolena Infante 11/23/2019  Procedures:   Incision and drainage per Dr. Rolena Infante, orthopedics 11/23/2019  MRI left foot 11/23/2019  This x-ray 11/26/2019  Plain films of the left foot 11/22/2019  ABI 11/23/2019  Antimicrobials:   Amoxicillin 11/27/2019>>>>  Ciprofloxacin 11/27/2019>.>>  IV cefepime 11/22/2019>>>>> 11/27/2019  IV Flagyl 11/22/2019>>>> 11/23/2019  IV Zosyn 812 2021x1 dose  IV vancomycin 11/22/2019>>>>> 11/25/2019   Subjective: Patient sitting up in bed. Denies any chest pain or shortness of breath. Denies any significant pain in his foot. Was waiting on wound VAC supplies to be delivered prior to discharge. Underwent radiation treatment this morning.  Objective: Vitals:   11/27/19 1328 11/27/19 2105 11/28/19 0517 11/28/19 1046  BP: (!) 162/90 (!) 141/85 139/83   Pulse: (!) 108 (!) 113 (!) 105 98  Resp: $Remo'16 20 20   'rRABS$ Temp: 98.6 F (37 C) 97.9 F (36.6 C) 98 F (36.7 C)   TempSrc:   Oral   SpO2: 99% 98% 98%   Weight:      Height:        Intake/Output Summary (Last 24 hours) at 11/28/2019 1815 Last data filed at 11/28/2019 1200 Gross per 24 hour  Intake 480 ml  Output 700 ml  Net -220 ml   Filed Weights   11/22/19 2038 11/23/19 0434  Weight: 95.7 kg 83.6 kg    Examination:  General exam: Appears calm and comfortable  Respiratory system: Clear to auscultation. Respiratory effort normal. Cardiovascular system: S1 & S2 heard, RRR. No JVD, murmurs, rubs, gallops or clicks. No pedal edema. Gastrointestinal system: Abdomen is nondistended, soft and nontender. No organomegaly or masses felt. Normal bowel sounds heard. Central nervous system: Alert and oriented. No focal neurological deficits. Extremities: Wound VAC noted to be in place on left foot. Symmetric 5 x 5 power. Skin: No rashes, lesions or ulcers Psychiatry: Judgement and insight appear normal. Mood & affect appropriate.     Data Reviewed: I have personally reviewed following labs and imaging  studies  CBC: Recent Labs  Lab 11/24/19 0515 11/24/19 0515 11/24/19 2240 11/25/19 0527 11/26/19 0531 11/27/19 0556 11/28/19 0550  WBC 1.7*  --   --  5.2 4.7 5.9 6.7  NEUTROABS 1.2*  --   --  3.4 3.8 4.1 4.4  HGB 7.0*   < > 8.3* 9.0* 8.8* 9.1* 9.1*  HCT 20.5*   < > 23.8* 26.8* 25.8* 26.6* 27.0*  MCV 81.0  --   --  82.5 80.9 81.6 82.3  PLT 31*  --   --  43* 52* 69* 96*   < > = values in this interval not displayed.    Basic Metabolic Panel: Recent Labs  Lab 11/23/19 0233 11/23/19 0233 11/24/19 0515 11/25/19 0527 11/26/19 0531 11/27/19 0556 11/28/19 0550  NA 132*   < > 124* 137 135 132* 133*  K 3.7   < > 2.8* 3.8 3.1* 3.2* 4.0  CL 95*   < > 93* 96* 94* 93* 93*  CO2 23   < > $R'23 30 30 30 30  'el$ GLUCOSE 295*   < > 259* 237* 221* 282* 269*  BUN 25*   < > $R'15 11 10 14 15  'xe$ CREATININE 1.71*   < > 1.24 1.37* 1.07 1.20 1.31*  CALCIUM 8.0*   < >  7.3* 8.3* 8.1* 8.1* 8.5*  MG 1.4*  --  1.4* 1.5*  --  1.8  --    < > = values in this interval not displayed.    GFR: Estimated Creatinine Clearance: 52.7 mL/min (A) (by C-G formula based on SCr of 1.31 mg/dL (H)).  Liver Function Tests: Recent Labs  Lab 11/24/19 0515 11/25/19 0527 11/26/19 0531 11/27/19 0556 11/28/19 0550  AST $Re'16 15 15 'iIl$ 13* 11*  ALT $Re'14 16 15 13 12  'MML$ ALKPHOS 66 76 74 87 81  BILITOT 0.5 0.9 1.0 0.6 0.8  PROT 5.2* 6.0* 5.5* 5.7* 5.8*  ALBUMIN 2.3* 2.9* 2.6* 2.6* 2.6*    CBG: Recent Labs  Lab 11/27/19 1615 11/27/19 2050 11/27/19 2137 11/28/19 0803 11/28/19 1253  GLUCAP 214* 59* 99 259* 273*     Recent Results (from the past 240 hour(s))  Culture, blood (Routine x 2)     Status: None   Collection Time: 11/22/19  9:14 PM   Specimen: BLOOD  Result Value Ref Range Status   Specimen Description   Final    BLOOD RIGHT ANTECUBITAL Performed at [facility], [place] [address] [place], [place] [address]    Special Requests   Final    BOTTLES DRAWN AEROBIC AND ANAEROBIC Blood Culture  results may not be optimal due to an excessive volume of blood received in culture bottles Performed at [place] [address], [place], [place] [address]    Culture   Final    NO GROWTH 5 DAYS Performed at [facility], [place] [address], [place], [place] [address]    Report Status 11/27/2019 FINAL  Final  SARS Coronavirus 2 by RT PCR (hospital order, performed in [facility] hospital lab) Nasopharyngeal Nasopharyngeal Swab     Status: None   Collection Time: 11/22/19 10:35 PM   Specimen: Nasopharyngeal Swab  Result Value Ref Range Status   SARS Coronavirus 2 NEGATIVE NEGATIVE Final    Comment: (NOTE) SARS-CoV-2 target nucleic acids are NOT DETECTED.  The SARS-CoV-2 RNA is generally detectable in upper and lower respiratory specimens during the acute phase of infection. The lowest concentration of SARS-CoV-2 viral copies this assay can detect is 250 copies / mL. A negative result does not preclude SARS-CoV-2 infection and should not be used as the sole basis for treatment or other patient management decisions.  A negative result may occur with improper specimen collection / handling, submission of specimen other than nasopharyngeal swab, presence of viral mutation(s) within the areas targeted by this assay, and inadequate number of viral copies (<250 copies / mL). A negative result must be combined with clinical observations, patient history, and epidemiological information.  Fact Sheet for Patients:   StrictlyIdeas.no  Fact Sheet for Healthcare Providers: BankingDealers.co.za  This test is not yet approved or  cleared by the [place] FDA and has been authorized for detection and/or diagnosis of SARS-CoV-2 by FDA under an Emergency Use Authorization (EUA).  This EUA will remain in effect (meaning this test can be used) for the duration of the COVID-19 declaration under Section 564(b)(1) of the Act,  21 U.S.C. section 360bbb-3(b)(1), unless the authorization is terminated or revoked sooner.  Performed at [facility], [place] [address], [place], [place] [address]   Culture, blood (Routine x 2)     Status: None   Collection Time: 11/23/19  2:33 AM   Specimen: BLOOD  Result Value Ref Range Status   Specimen Description  Final    BLOOD LEFT ANTECUBITAL Performed at [place] [address] [place], [place] [address]    Special Requests   Final    BOTTLES DRAWN AEROBIC AND ANAEROBIC Blood Culture results may not be optimal due to an excessive volume of blood received in culture bottles Performed at [place] [address] [place], [place] [address]    Culture   Final    NO GROWTH 5 DAYS Performed at [facility], [place] [address] [place], [place] [address]    Report Status 11/28/2019 FINAL  Final  Urine culture     Status: None   Collection Time: 11/23/19  2:33 AM   Specimen: In/Out Cath Urine  Result Value Ref Range Status   Specimen Description   Final    IN/OUT CATH URINE Performed at [place] [address], [place], [place] [address]    Special Requests   Final    NONE Performed at [facility], [place] [address] [place], [place] [address]    Culture   Final    NO GROWTH Performed at [facility], [place] [address] [place], [place] [address]    Report Status 11/23/2019 FINAL  Final  Aerobic/Anaerobic Culture (surgical/deep wound)     Status: None   Collection Time: 11/23/19  6:00 PM   Specimen: Abscess  Result Value Ref Range Status   Specimen Description ABSCESS LEFT FOOT  Final   Special Requests NONE  Final   Gram Stain NO WBC SEEN NO ORGANISMS SEEN   Final   Culture   Final    No growth aerobically or anaerobically. Performed at [facility], [place] [address], [place],  [address]    Report Status 11/28/2019 FINAL  Final          Radiology Studies: No results found.      Scheduled Meds: . amoxicillin  500 mg Oral Q8H  . ciprofloxacin  500 mg Oral BID  . donepezil  10 mg Oral QHS  . insulin aspart  0-5 Units Subcutaneous QHS  . insulin regular human CONCENTRATED  15 Units Subcutaneous TID WC  . loperamide  4 mg Oral BID  . melatonin  3 mg Oral QHS  . memantine  10 mg Oral BID  . metoprolol succinate  12.5 mg Oral Daily  . montelukast  10 mg Oral QHS  . pantoprazole  40 mg Oral Daily  . potassium chloride  40 mEq Oral BID  . simvastatin  40 mg Oral q1800  . sucralfate  1 g Oral QID  . venlafaxine XR  75 mg Oral BID   Continuous Infusions:   LOS: 6 days    Time spent: 35 minutes    Irine Seal, MD [facility]   To contact the attending provider between 7A-7P or the covering provider during after hours 7P-7A, please log into the web site [email or web address] and access using universal [place] password for that web site. If you do not have the password, please call the hospital operator.  11/28/2019, 6:15 PM

## 2019-11-28 NOTE — Progress Notes (Signed)
PT Cancellation Note  Patient Details Name: [name] [name]: [id number] DOB: 21-Aug-1945   Cancelled Treatment:    Reason Eval/Treat Not Completed: Followed up with pt on today. No further questions/concerns from pt regarding PT. He has RW and WC at home already. He also stated he will f/u with medical supply store about knee walker.    [facility]  Office: [phone] Pager: [phone]

## 2019-11-28 NOTE — Progress Notes (Signed)
[facility] Therapy Treatment Record Phone [phone]   Radiation Therapy was administered to [name] on: 11/28/2019  11:31 AM and was treatment # 27 out of a planned course of 27 treatments.  Radiation Treatment  1). Beam photons with 6-10 energy  2). Brachytherapy None  3). Stereotactic Radiosurgery None  4). Other Radiation None     Sabri Teal A Leanore Biggers, RT (T)

## 2019-11-28 NOTE — Progress Notes (Signed)
Patient's IVs removed.  Site WNL to lower forearm.  RUE IV removed with bruising.  AVS reviewed with patient.  Verbalized understanding of discharge instructions physician's follow-up, medications, wound vac.  Patient stable awaiting family for transport home.

## 2019-11-29 ENCOUNTER — Other Ambulatory Visit: Payer: Self-pay | Admitting: Student

## 2019-11-29 ENCOUNTER — Other Ambulatory Visit: Payer: Self-pay | Admitting: *Deleted

## 2019-11-29 LAB — GLUCOSE, CAPILLARY: Glucose-Capillary: 304 mg/dL — ABNORMAL HIGH (ref 70–99)

## 2019-11-29 NOTE — H&P (Signed)
Chief Complaint: Patient was seen in consultation today for port-a-catheter placement.   Referring Physician(s): Heilingoetter, Cassandra L  Supervising Physician: Markus Daft  Patient Status: [name] - Out-pt  History of Present Illness: [name] is a 74 y.o. male with a medical history that includes DM, CKD, chronic left foot ulcer and a new diagnosis of lung cancer. He presented to his PCP 10/10/19 with left-sided rib pain after a fall at home. Patient had also been experiencing hemoptysis for several weeks. Imaging was done and a right lower lobe/right hilar mass was discovered. Pathology revealed non-small cell carcinoma. Patient was recently admitted to the hospital for sepsis secondary to left foot ulcer (11/22/19-11/27/19). He has plans to start chemotherapy soon.  Interventional Radiology has been asked to evaluate this patient for an image-guided port-a-catheter placement to facilitate his treatment goals.   Past Medical History:  Diagnosis Date  . Allergic rhinitis due to pollen   . Cancer ([place])   . Chronic kidney disease, stage III (moderate) 12/19/2012  . Depression   . Diabetes mellitus ([place])   . Diverticulosis   . Erectile dysfunction associated with type 2 diabetes mellitus ([place]) 05/28/2015  . Former very heavy cigarette smoker (more than 40 per day) 10/07/2014  . GERD (gastroesophageal reflux disease)   . Gout   . Hyperlipidemia   . Hypertension   . Hypogonadism male 06/14/2012  . Iron deficiency   . Memory loss   . Osteoarthritis   . Personal history of colonic polyps    1996    Past Surgical History:  Procedure Laterality Date  . BRONCHIAL NEEDLE ASPIRATION BIOPSY  10/17/2019   Procedure: BRONCHIAL NEEDLE ASPIRATION BIOPSIES;  Surgeon: Juanito Doom, MD;  Location: [facility];  Service: Cardiopulmonary;;  . CARDIAC CATHETERIZATION  11/2011   [facility]  . CHOLECYSTECTOMY  1980  . ENDOBRONCHIAL ULTRASOUND Bilateral 10/17/2019   Procedure: ENDOBRONCHIAL  ULTRASOUND;  Surgeon: Juanito Doom, MD;  Location: [facility];  Service: Cardiopulmonary;  Laterality: Bilateral;  . ESOPHAGOGASTRODUODENOSCOPY (EGD) WITH PROPOFOL N/A 09/27/2017   Procedure: ESOPHAGOGASTRODUODENOSCOPY (EGD) WITH PROPOFOL;  Surgeon: Lin Landsman, MD;  Location: [place];  Service: Gastroenterology;  Laterality: N/A;  . EYE SURGERY Right 07/29/2016   cataract - Dr. Manuella Ghazi  . EYE SURGERY Left 08/23/2106   cataract - Dr. Manuella Ghazi  . HEMOSTASIS CONTROL  10/17/2019   Procedure: HEMOSTASIS CONTROL;  Surgeon: Juanito Doom, MD;  Location: [facility];  Service: Cardiopulmonary;;  . IRRIGATION AND DEBRIDEMENT ABSCESS Left 11/23/2019   Procedure: IRRIGATION AND DEBRIDEMENT ABSCESS;  Surgeon: Melina Schools, MD;  Location: [facility];  Service: Orthopedics;  Laterality: Left;  . TOTAL HIP ARTHROPLASTY  2009   Dr. Gladstone Lighter  . TOTAL HIP ARTHROPLASTY      Allergies: Tetracycline  Medications: Prior to Admission medications   Medication Sig Start Date End Date Taking? Authorizing Provider  acetaminophen (QC ACETAMINOPHEN 8HR ARTH PAIN) 650 MG CR tablet Take 650 mg by mouth every 8 (eight) hours as needed for pain.    [provider]  amoxicillin (AMOXIL) 500 MG capsule Take 1 capsule (500 mg total) by mouth every 8 (eight) hours. 11/27/19   Nita Sells, MD  ampicillin (PRINCIPEN) 500 MG capsule Take 500 mg by mouth daily as needed (flare up).  10/24/19   [provider]  Ascorbic Acid (VITAMIN C) 1000 MG tablet Take 1,000 mg by mouth daily.    [provider]  cholecalciferol (VITAMIN D) 1000 UNITS tablet Take 1,000 Units by  mouth daily.      [provider]  ciprofloxacin (CIPRO) 500 MG tablet Take 1 tablet (500 mg total) by mouth 2 (two) times daily. 11/27/19   Nita Sells, MD  donepezil (ARICEPT) 10 MG tablet TAKE 1 TABLET BY MOUTH AT BEDTIME Patient taking differently: Take 10 mg by mouth at bedtime.  08/24/19    Copland, Frederico Hamman, MD  fluticasone (FLONASE) 50 MCG/ACT nasal spray PLACE 2 SPRAYS IN EACH NOSTRIL DAILY Patient taking differently: Place 2 sprays into both nostrils daily as needed for allergies or rhinitis.  01/26/19   Copland, Frederico Hamman, MD  guaiFENesin-dextromethorphan (ROBITUSSIN DM) 100-10 MG/5ML syrup Take 5 mLs by mouth every 6 (six) hours as needed for cough. 10/14/19   Arrien, Jimmy Picket, MD  Hyprom-Naphaz-Polysorb-Zn Sulf (CLEAR EYES COMPLETE OP) Place 1 drop into both eyes daily as needed (dry eyes).    [provider]  Insulin Pen Needle (PEN NEEDLES) 31G X 5 MM MISC 1 each by Does not apply route 3 (three) times daily. To inject insulin E11.42 09/20/19   Philemon Kingdom, MD  insulin regular human CONCENTRATED (HUMULIN R U-500 KWIKPEN) 500 UNIT/ML kwikpen Inject under skin 80-90 units before b'fast, 60 units before lunch, 80-90 units vefore dinner Patient taking differently: Inject 20-40 Units into the skin See admin instructions. Takes 35-40 units before breakfast, 20 units at lunch and 35-40 units at night 09/17/19   Philemon Kingdom, MD  lidocaine-prilocaine (EMLA) cream Apply 1 application topically as needed. Patient taking differently: Apply 1 application topically as needed (port access).  11/19/19   Heilingoetter, Cassandra L, [name]  memantine (NAMENDA) 10 MG tablet TAKE 1 TABLET BY MOUTH TWICE A DAY Patient taking differently: Take 10 mg by mouth 2 (two) times daily.  09/20/19   Copland, Frederico Hamman, MD  metoprolol succinate (TOPROL-XL) 25 MG 24 hr tablet Take 0.5 tablets (12.5 mg total) by mouth daily. 11/28/19   Nita Sells, MD  montelukast (SINGULAIR) 10 MG tablet Take 1 tablet (10 mg total) by mouth at bedtime. 09/20/19   Bedsole, Amy E, MD  omeprazole (PRILOSEC) 40 MG capsule Take 1 capsule (40 mg total) by mouth 2 (two) times daily before a meal. Patient taking differently: Take 40 mg by mouth daily.  08/21/18 04/06/28  Copland, Frederico Hamman, MD  potassium chloride SA  (KLOR-CON) 20 MEQ tablet Take 2 tablets (40 mEq total) by mouth 2 (two) times daily. 11/27/19   Nita Sells, MD  prochlorperazine (COMPAZINE) 10 MG tablet Take 1 tablet (10 mg total) by mouth every 6 (six) hours as needed. Patient taking differently: Take 10 mg by mouth every 6 (six) hours as needed for nausea.  10/23/19   Heilingoetter, Cassandra L, [name]  simvastatin (ZOCOR) 40 MG tablet TAKE 1 TABLET BY MOUTH AT BEDTIME Patient taking differently: Take 40 mg by mouth every evening.  04/30/19   Copland, Frederico Hamman, MD  sucralfate (CARAFATE) 1 g tablet Take 1 tablet (1 g total) by mouth 4 (four) times daily. Dissolve each tablet in 15 cc water before use. 11/02/19   Kyung Rudd, MD  venlafaxine XR (EFFEXOR-XR) 75 MG 24 hr capsule TAKE 1 CAPSULE BY MOUTH EVERY MORNING AND 2 CAPSULES AT BEDTIME 11/26/19   Copland, Frederico Hamman, MD     Family History  Problem Relation Age of Onset  . Diabetes Mother   . Alzheimer's disease Mother   . Diabetes Father   . Lung cancer Father        Age 32  . Stroke Father   .  Heart attack Father   . Lung cancer Sister        lung  . Heart disease Maternal Grandfather   . COPD Sister   . Heart attack Sister   . Heart disease Sister     Social History   Socioeconomic History  . Marital status: Widowed    Spouse name: Not on file  . Number of children: 4  . Years of education: 12th  . Highest education level: Not on file  Occupational History  . Occupation: Counsellor  . Occupation: retired Magazine features editor: [employer]  Tobacco Use  . Smoking status: Former Smoker    Packs/day: 3.00    Years: 35.00    Pack years: 105.00    Types: Cigarettes    Quit date: 04/12/2000    Years since quitting: 19.6  . Smokeless tobacco: Never Used  Vaping Use  . Vaping Use: Never used  Substance and Sexual Activity  . Alcohol use: No  . Drug use: No  . Sexual activity: Never  Other Topics Concern  . Not on file  Social History Narrative   No regular  exercise   Caffeine use: yes, dt coke   Lives at home with his mother lives with him.   Wife Fraser Din recently deceased   Right-handed.   Social Determinants of Health   Financial Resource Strain: Low Risk   . Difficulty of Paying Living Expenses: Not hard at all  Food Insecurity: No Food Insecurity  . Worried About Charity fundraiser in the Last Year: Never true  . Ran Out of Food in the Last Year: Never true  Transportation Needs: No Transportation Needs  . Lack of Transportation (Medical): No  . Lack of Transportation (Non-Medical): No  Physical Activity: Inactive  . Days of Exercise per Week: 0 days  . Minutes of Exercise per Session: 0 min  Stress: No Stress Concern Present  . Feeling of Stress : Not at all  Social Connections:   . Frequency of Communication with Friends and Family: Not on file  . Frequency of Social Gatherings with Friends and Family: Not on file  . Attends Religious Services: Not on file  . Active Member of Clubs or Organizations: Not on file  . Attends Archivist Meetings: Not on file  . Marital Status: Not on file    Review of Systems: A 12 point ROS discussed and pertinent positives are indicated in the HPI above.  All other systems are negative.  Review of Systems  Constitutional: Negative for appetite change and fatigue.  Respiratory: Negative for cough and shortness of breath.   Cardiovascular: Negative for chest pain and leg swelling.  Gastrointestinal: Negative for abdominal pain, diarrhea, nausea and vomiting.  Musculoskeletal: Negative for back pain.  Skin: Positive for wound.       Left foot  Neurological: Positive for headaches.    Vital Signs: BP (!) 152/85 (BP Location: Right Arm)   Pulse (!) 119   Temp 98.1 F (36.7 C) (Oral)   Resp 18   Ht 5\' 11"  (1.803 m)   Wt 190 lb (86.2 kg)   SpO2 98%   BMI 26.50 kg/m   Physical Exam Constitutional:      General: He is not in acute distress. HENT:     Mouth/Throat:      Mouth: Mucous membranes are moist.     Pharynx: Oropharynx is clear.     Comments: Dentures upper and lower Cardiovascular:  Rate and Rhythm: Regular rhythm. Tachycardia present.     Pulses: Normal pulses.     Heart sounds: Normal heart sounds.  Pulmonary:     Effort: Pulmonary effort is normal.     Breath sounds: Normal breath sounds.  Abdominal:     General: Bowel sounds are normal.     Palpations: Abdomen is soft.  Musculoskeletal:     Cervical back: Normal range of motion.  Skin:    General: Skin is warm and dry.     Findings: Wound present.     Comments: Left foot, plantar surface. Negative pressure dressing with wound vac in place.   Neurological:     Mental Status: He is alert.     Imaging: DG Chest 2 View  Result Date: 11/22/2019 CLINICAL DATA:  Suspected sepsis EXAM: CHEST - 2 VIEW COMPARISON:  10/12/2019, CT 11/15/2019 FINDINGS: No focal consolidation or pleural effusion. Normal heart size. Decreased right hilar mass. No pneumothorax. IMPRESSION: No acute airspace disease. Interval decrease in size of right hilar mass Electronically Signed   By: Donavan Foil M.D.   On: 11/22/2019 21:07   CT Angio Chest PE W/Cm &/Or Wo Cm  Result Date: 11/15/2019 CLINICAL DATA:  Syncope. EXAM: CT ANGIOGRAPHY CHEST WITH CONTRAST TECHNIQUE: Multidetector CT imaging of the chest was performed using the standard protocol during bolus administration of intravenous contrast. Multiplanar CT image reconstructions and MIPs were obtained to evaluate the vascular anatomy. CONTRAST:  43mL OMNIPAQUE IOHEXOL 350 MG/ML SOLN COMPARISON:  CT chest dated October 11, 2019 FINDINGS: Cardiovascular: Contrast injection is sufficient to demonstrate satisfactory opacification of the pulmonary arteries to the segmental level. There is no pulmonary embolus or evidence of right heart strain. The size of the main pulmonary artery is normal. Heart size is normal, with no pericardial effusion. There are coronary artery  calcifications. There are minimal atherosclerotic changes of the thoracic aorta without evidence for an aneurysm. There is no significant pericardial effusion. Mediastinum/Nodes: --again noted are pathologically enlarged mediastinal lymph nodes. The dominant lymph node measures approximately 2.4 x 4.3 cm (previously measuring 2.7 by 6 cm). --there are pathologically enlarged right hilar lymph nodes. -- No axillary lymphadenopathy. -- No supraclavicular lymphadenopathy. --there is a right-sided hypoattenuating thyroid nodule measuring approximately 1.5 cm (axial series 4, image 9). -  Unremarkable esophagus. Lungs/Pleura: Again noted is a right hilar mass. This mass has demonstrated significant interval decrease in size and currently measures approximately 4.7 x 4.8 cm (previously measuring approximately 6.1 x 5.8 cm). There is new cavitation within the central aspect of this mass. There is no pneumothorax. No large pleural effusion. Upper Abdomen: Contrast bolus timing is not optimized for evaluation of the abdominal organs. The patient is status post prior cholecystectomy. There is a probable nonobstructing stone in the upper pole the left kidney. There is variant hepatic arterial anatomy with a replaced common hepatic artery. Musculoskeletal: There are healing fractures of the anterior there through fifth ribs on the patient's left. There is no new acute displaced fracture. Review of the MIP images confirms the above findings. IMPRESSION: 1. No evidence for acute pulmonary embolism. 2. Findings consistent with a positive response to treatment as evidence by interval decrease in size of the right hilar mass and mediastinal lymph nodes. 3. Right-sided hypoattenuating thyroid nodule measuring approximately 1.5 cm. Outpatient follow-up ultrasound is recommended if it has not already been performed.(Ref: J Am Coll Radiol. 2015 Feb;12(2): 143-50). 4. Healing left-sided rib fractures. Aortic Atherosclerosis  (ICD10-I70.0). Electronically Signed  By: Constance Holster M.D.   On: 11/15/2019 18:37   MR Brain W Wo Contrast  Result Date: 11/04/2019 CLINICAL DATA:  Non-small cell lung carcinoma staging EXAM: MRI HEAD WITHOUT AND WITH CONTRAST TECHNIQUE: Multiplanar, multiecho pulse sequences of the brain and surrounding structures were obtained without and with intravenous contrast. CONTRAST:  58mL GADAVIST GADOBUTROL 1 MMOL/ML IV SOLN COMPARISON:  None. FINDINGS: BRAIN: No acute infarct, acute hemorrhage or extra-axial collection. Multifocal white matter hyperintensity, most commonly due to chronic ischemic microangiopathy. There is generalized atrophy without lobar predilection. No chronic microhemorrhage. Normal midline structures. There is no abnormal contrast enhancement. VASCULAR: Major flow voids are preserved. SKULL AND UPPER CERVICAL SPINE: Normal calvarium and skull base. Visualized upper cervical spine and soft tissues are normal. SINUSES/ORBITS: No paranasal sinus fluid levels or advanced mucosal thickening. No mastoid or middle ear effusion. Normal orbits. IMPRESSION: 1. No intracranial metastatic disease. 2. Generalized atrophy and chronic ischemic microangiopathy. Electronically Signed   By: Ulyses Jarred M.D.   On: 11/04/2019 20:56   MR FOOT LEFT W WO CONTRAST  Result Date: 11/23/2019 CLINICAL DATA:  Chronic left foot ulcer with erythema and recent drainage. History of lung cancer. Concern for osteomyelitis. EXAM: MRI OF THE LEFT FOREFOOT WITHOUT AND WITH CONTRAST TECHNIQUE: Multiplanar, multisequence MR imaging of the left forefoot was performed both before and after administration of intravenous contrast. CONTRAST:  4mL GADAVIST GADOBUTROL 1 MMOL/ML IV SOLN COMPARISON:  Radiographs 11/22/2019 and 02/12/2019 FINDINGS: Bones/Joint/Cartilage There is soft tissue ulceration along the plantar aspect of the midfoot at the level of the 4th and 5th metatarsal bases. There appears to be mild soft tissue  ulceration along the plantar aspect of the 5th metatarsal head as well. No underlying cortical destruction, marrow edema or suspicious marrow enhancement. There are mild midfoot and 1st MTP degenerative changes without significant joint effusions. The tibial sesamoid of the 1st metatarsal is bipartite. The distal toes are incompletely visualized. Possible nondisplaced transverse fracture through the base of the 5th proximal phalanx. Ligaments Intact Lisfranc ligament. 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 No focal intramuscular fluid collection or tenosynovitis. The ankle tendons appear intact. Soft tissues As above, apparent soft tissue ulceration along the plantar aspect of the 4th and 5th metatarsal bases and the 5th metatarsal head. There is associated heterogeneous enhancement following contrast, but no evidence of drainable fluid collection or foreign body. IMPRESSION: 1. Soft tissue ulceration along the plantar aspect of the midfoot at the level of the 4th and 5th metatarsal bases and the 5th metatarsal head. No evidence of drainable fluid collection or foreign body. 2. No evidence of osteomyelitis. 3. Possible nondisplaced transverse fracture through the base of the 5th proximal phalanx. Electronically Signed   By: Richardean Sale M.D.   On: 11/23/2019 16:31   NM PET Image Initial (PI) Skull Base To Thigh  Result Date: 11/01/2019 CLINICAL DATA:  Initial treatment strategy for lung cancer. EXAM: NUCLEAR MEDICINE PET SKULL BASE TO THIGH TECHNIQUE: 9.9 mCi F-18 FDG was injected intravenously. Full-ring PET imaging was performed from the skull base to thigh after the radiotracer. CT data was obtained and used for attenuation correction and anatomic localization. Fasting blood glucose: 342 mg/dl COMPARISON:  10/11/2019 FINDINGS: Mediastinal blood pool activity: SUV max 3.42 Liver activity: SUV max NA Note: Patient presents today with  abnormally elevated fasting blood glucose at 342. Exam was attempted yesterday however the fasting blood glucose was even higher. Patient reports difficulty  with achieving a normal blood glucose level and therefore the decision was made to proceed with the exam knowing that a markedly elevated fasting blood sugar may reduce sensitivity of this exam as the radiotracer is an analog of blood glucose. Neck: No FDG avid mass or adenopathy identified. Incidental CT findings: None CHEST: Superior segment of right lower lobe perihilar lung mass is again noted. The mass appears internally necrotic with a maximum dimension of 6.4 cm with SUV max of 4.6, image 76/4. The tumor invades the mediastinum and has mass effect the posterior wall of the right mainstem bronchus. Encasement and narrowing of the proximal right upper lobe bronchus and bronchus intermedius noted. Signs of postobstructive pneumonitis versus lymphangitic spread of disease is noted within the posteromedial right upper lobe, image 67/4. Small subpleural nodule is identified within the posterior aspect of the superior segment of right lower lobe measuring 10 mm. Mild FDG uptake with SUV max of 1.3 is associated with this nodule, image 84/4. Sub solid, cystic lesion within the lateral right lung base measures approximately 1 cm without significant FDG uptake. No suspicious pulmonary nodule or mass within the contralateral lung. Large subcarinal lymph node has a short axis of 2.3 cm and an SUV max of 3.7. Right paratracheal lymph node measures 2.8 cm and appears predominantly necrotic within SUV max of 2.7. Adjacent right paratracheal lymph node measures 1.5 cm with SUV max of 3.7. No left-sided mediastinal or hilar adenopathy. No FDG avid supraclavicular or axillary lymph nodes. Incidental CT findings: Aortic atherosclerosis. Lad and RCA coronary artery calcifications. ABDOMEN/PELVIS: No FDG uptake within the liver, pancreas, spleen, or adrenal glands. No FDG  avid lymph nodes within the abdomen or pelvis. Incidental CT findings: Cholecystectomy. Aortic atherosclerosis. 3 mm stone is identified within upper pole of left kidney. 3 cm cyst arises from the lateral cortex of the right kidney. SKELETON: No focal hypermetabolic activity to suggest skeletal metastasis. Incidental CT findings: Previous right hip arthroplasty. Posttraumatic rib deformities are noted involving the anterior aspect of the left third and fourth ribs with expected mild FDG uptake. IMPRESSION: 1. Note: Patient presented for the second day in a row with markedly elevated fasting blood sugars. The radiotracer is a glucose analog and ideally the exam should be performed with a normal fasting blood glucose level as elevated blood glucose may diminish the sensitivity for detecting FDG avid disease. 2. FDG avid necrotic perihilar lung mass involving the superior segment of right lower lobe is identified compatible with primary bronchogenic carcinoma. 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 Imaging findings are compatible with at least a T3N2M0 lesion, stage IIIb disease. 3. Involvement of the right mainstem bronchus, right upper lobe bronchi and bronchus intermedius noted. Postobstructive pneumonitis versus lymphangitic spread of disease is noted within the central portion of the posteromedial right upper lobe. 4. Enlarged FDG avid lymph nodes are identified within the right paratracheal nodal chain and subcarinal region. Dominant right paratracheal lymph node is centrally necrotic with expected decreased FDG uptake. 5. There are 2 small subpleural nodules within the posterior right lower lobe. Cannot rule out satellite lesions. 6. Sub solid, cystic nodule is noted within the lateral right lung base. No significant tracer uptake associated with this nodule. 7. No signs of metastatic disease to the abdomen or pelvis or skeletal structures. 8. Subacute,  posttraumatic rib deformities are noted involving the anterior aspect of the left third and fourth ribs 9. Aortic Atherosclerosis (ICD10-I70.0) and Emphysema (ICD10-J43.9).  10. Multi vessel coronary artery atherosclerotic calcifications. Electronically Signed   By: Kerby Moors M.D.   On: 11/01/2019 10:56   DG CHEST PORT 1 VIEW  Result Date: 11/26/2019 CLINICAL DATA:  Foot ulcer.  Sepsis. EXAM: PORTABLE CHEST 1 VIEW COMPARISON:  11/22/2019 FINDINGS: The cardiac silhouette, mediastinal and hilar contours are within normal limits and stable. Stable radiation changes are noted in the right hilum/paramediastinal lung. No acute pulmonary findings or pleural effusions. No worrisome pulmonary lesions. The bony thorax is intact. IMPRESSION: 1. Stable radiation changes in the right hilum/paramediastinal lung. 2. No acute cardiopulmonary findings. Electronically Signed   By: Marijo Sanes M.D.   On: 11/26/2019 15:45   DG Foot Complete Left  Result Date: 11/22/2019 CLINICAL DATA:  Foot pain EXAM: LEFT FOOT - COMPLETE 3+ VIEW COMPARISON:  02/12/2019 FINDINGS: No acute displaced fracture or malalignment. Soft tissue ulceration at the mid sole of the foot. No radiopaque foreign body. Small plantar calcaneal spur. No osseous destructive change. Mild degenerative change at the first MTP joint. IMPRESSION: No acute osseous abnormality. Soft tissue ulceration at the mid sole of the foot. Electronically Signed   By: Donavan Foil M.D.   On: 11/22/2019 21:38   VAS [place] ABI WITH/WO TBI  Result Date: 11/24/2019 LOWER EXTREMITY DOPPLER STUDY Indications: Ulceration of LT foot. High Risk Factors: Hypertension, Diabetes.  Comparison Study: No prior studies. Performing Technologist: Darlin Coco  Examination Guidelines: A complete evaluation includes at minimum, Doppler waveform signals and systolic blood pressure reading at the level of bilateral brachial, anterior tibial, and posterior tibial arteries, when vessel segments are  accessible. Bilateral testing is considered an integral part of a complete examination. Photoelectric Plethysmograph (PPG) waveforms and toe systolic pressure readings are included as required and additional duplex testing as needed. Limited examinations for reoccurring indications may be performed as noted.  ABI Findings: +---------+------------------+-----+---------+--------+ Right    Rt Pressure (mmHg)IndexWaveform Comment  +---------+------------------+-----+---------+--------+ Brachial 154                    triphasic         +---------+------------------+-----+---------+--------+ PTA      161               1.05 triphasic         +---------+------------------+-----+---------+--------+ DP       143               0.93 triphasic         +---------+------------------+-----+---------+--------+ Great Toe100               0.65 Abnormal          +---------+------------------+-----+---------+--------+ +---------+------------------+-----+---------+-------+ Left     Lt Pressure (mmHg)IndexWaveform Comment +---------+------------------+-----+---------+-------+ Brachial 152                    triphasic        +---------+------------------+-----+---------+-------+ PTA      155               1.01 triphasic        +---------+------------------+-----+---------+-------+ DP       114               0.74 triphasic        +---------+------------------+-----+---------+-------+ Great Toe120               0.78 Normal           +---------+------------------+-----+---------+-------+ +-------+-----------+-----------+------------+------------+ ABI/TBIToday's ABIToday's TBIPrevious ABIPrevious TBI +-------+-----------+-----------+------------+------------+ Right  1.05  0.65                                +-------+-----------+-----------+------------+------------+ Left   1.01       0.78                                 +-------+-----------+-----------+------------+------------+  Summary: Right: Resting right ankle-brachial index is within normal range. No evidence of significant right lower extremity arterial disease. The right toe-brachial index is mildly abnormal. Left: Resting left ankle-brachial index is within normal range. No evidence of significant left lower extremity arterial disease. The left toe-brachial index is normal.  *See table(s) above for measurements and observations.  Electronically signed by Ruta Hinds MD on 11/24/2019 at 12:11:40 PM.    Final     Labs:  CBC: Recent Labs    11/25/19 0527 11/26/19 0531 11/27/19 0556 11/28/19 0550  WBC 5.2 4.7 5.9 6.7  HGB 9.0* 8.8* 9.1* 9.1*  HCT 26.8* 25.8* 26.6* 27.0*  PLT 43* 52* 69* 96*    COAGS: Recent Labs    10/13/19 0435 10/17/19 1724 11/22/19 2115 11/23/19 0233  INR 1.0 1.1 1.1 1.0  APTT  --  32 34  --     BMP: Recent Labs    11/25/19 0527 11/26/19 0531 11/27/19 0556 11/28/19 0550  NA 137 135 132* 133*  K 3.8 3.1* 3.2* 4.0  CL 96* 94* 93* 93*  CO2 30 30 30 30   GLUCOSE 237* 221* 282* 269*  BUN 11 10 14 15   CALCIUM 8.3* 8.1* 8.1* 8.5*  CREATININE 1.37* 1.07 1.20 1.31*  GFRNONAA 50* >60 59* 53*  GFRAA 58* >60 >60 >60    LIVER FUNCTION TESTS: Recent Labs    11/25/19 0527 11/26/19 0531 11/27/19 0556 11/28/19 0550  BILITOT 0.9 1.0 0.6 0.8  AST 15 15 13* 11*  ALT 16 15 13 12   ALKPHOS 76 74 87 81  PROT 6.0* 5.5* 5.7* 5.8*  ALBUMIN 2.9* 2.6* 2.6* 2.6*    TUMOR MARKERS: No results for input(s): AFPTM, CEA, CA199, CHROMGRNA in the last 8760 hours.  Assessment and Plan:  Right lung cancer: [name], 74 year old male, presents today to the [facility] department for an image-guided port-a-catheter insertion.   Risks and benefits of image-guided Port-a-catheter placement were discussed with the patient including, but not limited to bleeding, infection, pneumothorax, or fibrin  sheath development and need for additional procedures.  All of the patient's questions were answered, patient is agreeable to proceed.  The patient has been NPO. Labs have been reviewed. He does not take any blood thinning medications.   Consent signed and in chart.  Thank you for this interesting consult.  I greatly enjoyed meeting TORETTO TINGLER and look forward to participating in their care.  A copy of this report was sent to the requesting provider on this date.  Electronically Signed: Soyla Dryer, AGACNP-BC [phone] 11/30/2019, 8:57 AM   I spent a total of  30 Minutes   in face to face in clinical consultation, greater than 50% of which was counseling/coordinating care for port-a-catheter placement.

## 2019-11-29 NOTE — Patient Outreach (Signed)
[facility]) Care Management  11/29/2019  MATH BRAZIE Oct 13, 1945 [id number]   Notified that member was discharged from hospital yesterday.  Was admitted for sepsis of foot wound, requiring I&D, discharged with wound vac.  Call placed to member, he report he is recovering well.  Confirms he has wound vac and home health nurse has reached out to him for follow up.  State there is no drainage today, will follow up with wound care center on Monday, 8/23.  Verbalizes understanding of signs/symptoms of recurrent infection.  He had his last radiation treatment yesterday, will proceed with chemo next week.  He is going to have port placed tomorrow for infusions.  Denies any urgent concerns at this time, agrees to follow within the next month.  Encouraged to contact this care manager with any questions.  Goals Addressed              This Visit's Progress   .  Finish radiation treatment and get better (pt-stated)   On track     [place] (see longitudinal plan of care for additional care plan information)  Current Barriers:  [name] Kitchen Knowledge Deficits related to new cancer diagnosis  Nurse Case Manager Clinical Goal(s):  [name] Kitchen Over the next 28 days, patient will verbalize understanding of plan for cancer treatments . Over the next 31 days, patient will not experience hospital admission. Hospital Admissions in last 6 months = 2 . Over the next 28 days, patient will attend all scheduled medical appointments: with cancer center  Interventions:  . Inter-disciplinary care team collaboration (see longitudinal plan of care) . Evaluation of current treatment plan related to radiation therapy and patient's adherence to plan as established by provider. . Discussed plans with patient for ongoing care management follow up and provided patient with direct contact information for care management team . Reviewed scheduled/upcoming provider appointments including: cancer radiation  therapy  Patient Self Care Activities:  . Attends all scheduled provider appointments . Performs ADL's independently . Calls provider office for new concerns or questions  Initial goal documentation       [name], RN, MSN [place] [phone]

## 2019-11-30 ENCOUNTER — Encounter (HOSPITAL_COMMUNITY): Payer: Self-pay

## 2019-11-30 ENCOUNTER — Other Ambulatory Visit: Payer: Self-pay

## 2019-11-30 ENCOUNTER — Other Ambulatory Visit: Payer: Self-pay | Admitting: Physician Assistant

## 2019-11-30 ENCOUNTER — Ambulatory Visit (HOSPITAL_COMMUNITY)
Admission: RE | Admit: 2019-11-30 | Discharge: 2019-11-30 | Disposition: A | Discharge status: Home / Self Care | Payer: Medicare Other | Source: Ambulatory Visit | Attending: Physician Assistant | Admitting: Physician Assistant

## 2019-11-30 ENCOUNTER — Ambulatory Visit (HOSPITAL_COMMUNITY)
Admission: RE | Admit: 2019-11-30 | Discharge: 2019-11-30 | Disposition: A | Discharge status: Home / Self Care | Payer: Medicare Other | Source: Ambulatory Visit | Attending: Radiation Oncology | Admitting: Radiation Oncology

## 2019-11-30 DIAGNOSIS — E1122 Type 2 diabetes mellitus with diabetic chronic kidney disease: Secondary | ICD-10-CM | POA: Insufficient documentation

## 2019-11-30 DIAGNOSIS — Z801 Family history of malignant neoplasm of trachea, bronchus and lung: Secondary | ICD-10-CM | POA: Insufficient documentation

## 2019-11-30 DIAGNOSIS — Z881 Allergy status to other antibiotic agents status: Secondary | ICD-10-CM | POA: Diagnosis not present

## 2019-11-30 DIAGNOSIS — Z87891 Personal history of nicotine dependence: Secondary | ICD-10-CM | POA: Diagnosis not present

## 2019-11-30 DIAGNOSIS — R413 Other amnesia: Secondary | ICD-10-CM | POA: Insufficient documentation

## 2019-11-30 DIAGNOSIS — M1990 Unspecified osteoarthritis, unspecified site: Secondary | ICD-10-CM | POA: Diagnosis not present

## 2019-11-30 DIAGNOSIS — E785 Hyperlipidemia, unspecified: Secondary | ICD-10-CM | POA: Insufficient documentation

## 2019-11-30 DIAGNOSIS — Z794 Long term (current) use of insulin: Secondary | ICD-10-CM | POA: Insufficient documentation

## 2019-11-30 DIAGNOSIS — C3431 Malignant neoplasm of lower lobe, right bronchus or lung: Secondary | ICD-10-CM | POA: Diagnosis not present

## 2019-11-30 DIAGNOSIS — M109 Gout, unspecified: Secondary | ICD-10-CM | POA: Insufficient documentation

## 2019-11-30 DIAGNOSIS — N183 Chronic kidney disease, stage 3 unspecified: Secondary | ICD-10-CM | POA: Diagnosis not present

## 2019-11-30 DIAGNOSIS — C3490 Malignant neoplasm of unspecified part of unspecified bronchus or lung: Secondary | ICD-10-CM | POA: Insufficient documentation

## 2019-11-30 DIAGNOSIS — Z8249 Family history of ischemic heart disease and other diseases of the circulatory system: Secondary | ICD-10-CM | POA: Insufficient documentation

## 2019-11-30 DIAGNOSIS — K219 Gastro-esophageal reflux disease without esophagitis: Secondary | ICD-10-CM | POA: Diagnosis not present

## 2019-11-30 DIAGNOSIS — Z79899 Other long term (current) drug therapy: Secondary | ICD-10-CM | POA: Insufficient documentation

## 2019-11-30 DIAGNOSIS — I129 Hypertensive chronic kidney disease with stage 1 through stage 4 chronic kidney disease, or unspecified chronic kidney disease: Secondary | ICD-10-CM | POA: Diagnosis not present

## 2019-11-30 DIAGNOSIS — Z96649 Presence of unspecified artificial hip joint: Secondary | ICD-10-CM | POA: Insufficient documentation

## 2019-11-30 DIAGNOSIS — Z452 Encounter for adjustment and management of vascular access device: Secondary | ICD-10-CM | POA: Diagnosis not present

## 2019-11-30 HISTORY — PX: IR IMAGING GUIDED PORT INSERTION: IMG5740

## 2019-11-30 LAB — GLUCOSE, CAPILLARY: Glucose-Capillary: 321 mg/dL — ABNORMAL HIGH (ref 70–99)

## 2019-11-30 MED ORDER — LIDOCAINE-EPINEPHRINE 1 %-1:100000 IJ SOLN
INTRAMUSCULAR | Status: AC
  Filled 2019-11-30: qty 1

## 2019-11-30 MED ORDER — CEFAZOLIN SODIUM-DEXTROSE 2-4 GM/100ML-% IV SOLN
INTRAVENOUS | Status: AC
  Filled 2019-11-30: qty 100

## 2019-11-30 MED ORDER — SODIUM CHLORIDE 0.9 % IV SOLN: INTRAVENOUS | Status: DC

## 2019-11-30 MED ORDER — FENTANYL CITRATE (PF) 100 MCG/2ML IJ SOLN
INTRAMUSCULAR | Status: AC | PRN
  Administered 2019-11-30 (×2): 50 ug via INTRAVENOUS

## 2019-11-30 MED ORDER — HEPARIN SOD (PORK) LOCK FLUSH 100 UNIT/ML IV SOLN
INTRAVENOUS | Status: AC | PRN
  Administered 2019-11-30: 500 [IU] via INTRAVENOUS

## 2019-11-30 MED ORDER — CEFAZOLIN SODIUM-DEXTROSE 2-4 GM/100ML-% IV SOLN
2.0000 g | Freq: Once | INTRAVENOUS | Status: AC
  Administered 2019-11-30: 2 g via INTRAVENOUS

## 2019-11-30 MED ORDER — LIDOCAINE HCL (PF) 1 % IJ SOLN
INTRAMUSCULAR | Status: AC | PRN
  Administered 2019-11-30: 5 mL

## 2019-11-30 MED ORDER — FENTANYL CITRATE (PF) 100 MCG/2ML IJ SOLN
INTRAMUSCULAR | Status: AC
Start: 2019-11-30 — End: 2019-11-30
  Filled 2019-11-30: qty 2

## 2019-11-30 MED ORDER — MIDAZOLAM HCL 2 MG/2ML IJ SOLN
INTRAMUSCULAR | Status: AC | PRN
  Administered 2019-11-30 (×2): 1 mg via INTRAVENOUS

## 2019-11-30 MED ORDER — LIDOCAINE HCL 1 % IJ SOLN
INTRAMUSCULAR | Status: AC
  Filled 2019-11-30: qty 20

## 2019-11-30 MED ORDER — HEPARIN SOD (PORK) LOCK FLUSH 100 UNIT/ML IV SOLN
INTRAVENOUS | Status: AC
  Filled 2019-11-30: qty 5

## 2019-11-30 MED ORDER — MIDAZOLAM HCL 2 MG/2ML IJ SOLN
INTRAMUSCULAR | Status: AC
  Filled 2019-11-30: qty 4

## 2019-11-30 MED ORDER — LIDOCAINE-EPINEPHRINE 1 %-1:100000 IJ SOLN
INTRAMUSCULAR | Status: AC | PRN
  Administered 2019-11-30: 10 mL

## 2019-11-30 NOTE — Procedures (Signed)
Interventional Radiology Procedure:   Indications: Right lung cancer  Procedure: Port placement  Findings: Right jugular port, tip at SVC/RA junction  Complications: None     EBL: Minimal, less than 10 ml  Plan: Discharge in one hour.  Keep port site and incisions dry for at least 24 hours.     Merlene Dante R. Anselm Pancoast, MD  Pager: [phone]

## 2019-11-30 NOTE — Progress Notes (Signed)
Post procedure- pt. Stable. Called family member and informed of discharge time of 12noon- delayed time due to family only able to pick up at 1pm due to having caregiver at home with his mom.

## 2019-11-30 NOTE — Discharge Instructions (Signed)
For questions /concerns may call Interventional Radiology at [phone]  You may remove your dressing and shower tomorrow afternoon  DO NOT use EMLA cream for 2 weeks after port placement as the cream will remove surgical glue on your incision.    Implanted Port Insertion, Care After This sheet gives you information about how to care for yourself after your procedure. Your health care provider may also give you more specific instructions. If you have problems or questions, contact your health care provider. What can I expect after the procedure? After the procedure, it is common to have:  Discomfort at the port insertion site.  Bruising on the skin over the port. This should improve over 3-4 days. Follow these instructions at home: Port care  After your port is placed, you will get a manufacturer's information card. The card has information about your port. Keep this card with you at all times.  Take care of the port as told by your health care provider. Ask your health care provider if you or a family member can get training for taking care of the port at home. A home health care nurse may also take care of the port.  Make sure to remember what type of port you have. Incision care      Follow instructions from your health care provider about how to take care of your port insertion site. Make sure you: ? Wash your hands with soap and water before and after you change your bandage (dressing). If soap and water are not available, use hand sanitizer. ? Change your dressing as told by your health care provider. ? Leave stitches (sutures), skin glue, or adhesive strips in place. These skin closures may need to stay in place for 2 weeks or longer. If adhesive strip edges start to loosen and curl up, you may trim the loose edges. Do not remove adhesive strips completely unless your health care provider tells you to do that.  Check your port insertion site every day for signs of  infection. Check for: ? Redness, swelling, or pain. ? Fluid or blood. ? Warmth. ? Pus or a bad smell. Activity  Return to your normal activities as told by your health care provider. Ask your health care provider what activities are safe for you.  Do not lift anything that is heavier than 10 lb (4.5 kg), or the limit that you are told, until your health care provider says that it is safe. General instructions  Take over-the-counter and prescription medicines only as told by your health care provider.  Do not take baths, swim, or use a hot tub until your health care provider approves. Ask your health care provider if you may take showers. You may only be allowed to take sponge baths.  Do not drive for 24 hours if you were given a sedative during your procedure.  Wear a medical alert bracelet in case of an emergency. This will tell any health care providers that you have a port.  Keep all follow-up visits as told by your health care provider. This is important. Contact a health care provider if:  You cannot flush your port with saline as directed, or you cannot draw blood from the port.  You have a fever or chills.  You have redness, swelling, or pain around your port insertion site.  You have fluid or blood coming from your port insertion site.  Your port insertion site feels warm to the touch.  You have pus or a bad   smell coming from the port insertion site. Get help right away if:  You have chest pain or shortness of breath.  You have bleeding from your port that you cannot control. Summary  Take care of the port as told by your health care provider. Keep the manufacturer's information card with you at all times.  Change your dressing as told by your health care provider.  Contact a health care provider if you have a fever or chills or if you have redness, swelling, or pain around your port insertion site.  Keep all follow-up visits as told by your health care  provider. This information is not intended to replace advice given to you by your health care provider. Make sure you discuss any questions you have with your health care provider. Document Revised: 10/25/2017 Document Reviewed: 10/25/2017 Elsevier Patient Education  2020 [employer] Moderate Conscious Sedation, Adult, Care After These instructions provide you with information about caring for yourself after your procedure. Your health care provider may also give you more specific instructions. Your treatment has been planned according to current medical practices, but problems sometimes occur. Call your health care provider if you have any problems or questions after your procedure. What can I expect after the procedure? After your procedure, it is common:  To feel sleepy for several hours.  To feel clumsy and have poor balance for several hours.  To have poor judgment for several hours.  To vomit if you eat too soon. Follow these instructions at home: For at least 24 hours after the procedure:   Do not: ? Participate in activities where you could fall or become injured. ? Drive. ? Use heavy machinery. ? Drink alcohol. ? Take sleeping pills or medicines that cause drowsiness. ? Make important decisions or sign legal documents. ? Take care of children on your own.  Rest. Eating and drinking  Follow the diet recommended by your health care provider.  If you vomit: ? Drink water, juice, or soup when you can drink without vomiting. ? Make sure you have little or no nausea before eating solid foods. General instructions  Have a responsible adult stay with you until you are awake and alert.  Take over-the-counter and prescription medicines only as told by your health care provider.  If you smoke, do not smoke without supervision.  Keep all follow-up visits as told by your health care provider. This is important. Contact a health care provider if:  You keep feeling  nauseous or you keep vomiting.  You feel light-headed.  You develop a rash.  You have a fever. Get help right away if:  You have trouble breathing. This information is not intended to replace advice given to you by your health care provider. Make sure you discuss any questions you have with your health care provider. Document Revised: 03/11/2017 Document Reviewed: 07/19/2015 Elsevier Patient Education  2020 [employer] Inc.  

## 2019-12-02 DIAGNOSIS — E1142 Type 2 diabetes mellitus with diabetic polyneuropathy: Secondary | ICD-10-CM | POA: Diagnosis not present

## 2019-12-02 DIAGNOSIS — Z9181 History of falling: Secondary | ICD-10-CM | POA: Diagnosis not present

## 2019-12-02 DIAGNOSIS — C3491 Malignant neoplasm of unspecified part of right bronchus or lung: Secondary | ICD-10-CM | POA: Diagnosis not present

## 2019-12-02 DIAGNOSIS — E1165 Type 2 diabetes mellitus with hyperglycemia: Secondary | ICD-10-CM | POA: Diagnosis not present

## 2019-12-02 DIAGNOSIS — G4733 Obstructive sleep apnea (adult) (pediatric): Secondary | ICD-10-CM | POA: Diagnosis not present

## 2019-12-02 DIAGNOSIS — L97429 Non-pressure chronic ulcer of left heel and midfoot with unspecified severity: Secondary | ICD-10-CM | POA: Diagnosis not present

## 2019-12-02 DIAGNOSIS — I129 Hypertensive chronic kidney disease with stage 1 through stage 4 chronic kidney disease, or unspecified chronic kidney disease: Secondary | ICD-10-CM | POA: Diagnosis not present

## 2019-12-02 DIAGNOSIS — W010XXD Fall on same level from slipping, tripping and stumbling without subsequent striking against object, subsequent encounter: Secondary | ICD-10-CM | POA: Diagnosis not present

## 2019-12-02 DIAGNOSIS — Z794 Long term (current) use of insulin: Secondary | ICD-10-CM | POA: Diagnosis not present

## 2019-12-02 DIAGNOSIS — J45909 Unspecified asthma, uncomplicated: Secondary | ICD-10-CM | POA: Diagnosis not present

## 2019-12-02 DIAGNOSIS — E11621 Type 2 diabetes mellitus with foot ulcer: Secondary | ICD-10-CM | POA: Diagnosis not present

## 2019-12-02 DIAGNOSIS — E041 Nontoxic single thyroid nodule: Secondary | ICD-10-CM | POA: Diagnosis not present

## 2019-12-02 DIAGNOSIS — N1831 Chronic kidney disease, stage 3a: Secondary | ICD-10-CM | POA: Diagnosis not present

## 2019-12-02 DIAGNOSIS — R000 Tachycardia, unspecified: Secondary | ICD-10-CM | POA: Diagnosis not present

## 2019-12-02 DIAGNOSIS — L02612 Cutaneous abscess of left foot: Secondary | ICD-10-CM | POA: Diagnosis not present

## 2019-12-02 DIAGNOSIS — Z87891 Personal history of nicotine dependence: Secondary | ICD-10-CM | POA: Diagnosis not present

## 2019-12-02 DIAGNOSIS — S2232XD Fracture of one rib, left side, subsequent encounter for fracture with routine healing: Secondary | ICD-10-CM | POA: Diagnosis not present

## 2019-12-03 ENCOUNTER — Inpatient Hospital Stay: Payer: Medicare Other

## 2019-12-03 ENCOUNTER — Encounter: Payer: Self-pay | Admitting: Internal Medicine

## 2019-12-03 ENCOUNTER — Telehealth: Payer: Self-pay | Admitting: Physician Assistant

## 2019-12-03 ENCOUNTER — Other Ambulatory Visit: Payer: Self-pay

## 2019-12-03 ENCOUNTER — Inpatient Hospital Stay (HOSPITAL_BASED_OUTPATIENT_CLINIC_OR_DEPARTMENT_OTHER): Payer: Medicare Other | Admitting: Internal Medicine

## 2019-12-03 ENCOUNTER — Encounter: Payer: Medicare Other | Attending: Physician Assistant | Admitting: Physician Assistant

## 2019-12-03 VITALS — BP 111/68 | HR 128 | Temp 98.8°F | Resp 17 | Ht 71.0 in | Wt 188.0 lb

## 2019-12-03 DIAGNOSIS — Z794 Long term (current) use of insulin: Secondary | ICD-10-CM | POA: Diagnosis not present

## 2019-12-03 DIAGNOSIS — E1142 Type 2 diabetes mellitus with diabetic polyneuropathy: Secondary | ICD-10-CM | POA: Diagnosis not present

## 2019-12-03 DIAGNOSIS — C3490 Malignant neoplasm of unspecified part of unspecified bronchus or lung: Secondary | ICD-10-CM | POA: Diagnosis not present

## 2019-12-03 DIAGNOSIS — N183 Chronic kidney disease, stage 3 unspecified: Secondary | ICD-10-CM | POA: Diagnosis not present

## 2019-12-03 DIAGNOSIS — L89899 Pressure ulcer of other site, unspecified stage: Secondary | ICD-10-CM | POA: Diagnosis not present

## 2019-12-03 DIAGNOSIS — Z881 Allergy status to other antibiotic agents status: Secondary | ICD-10-CM | POA: Diagnosis not present

## 2019-12-03 DIAGNOSIS — I10 Essential (primary) hypertension: Secondary | ICD-10-CM | POA: Insufficient documentation

## 2019-12-03 DIAGNOSIS — L97522 Non-pressure chronic ulcer of other part of left foot with fat layer exposed: Secondary | ICD-10-CM | POA: Diagnosis not present

## 2019-12-03 DIAGNOSIS — Z5111 Encounter for antineoplastic chemotherapy: Secondary | ICD-10-CM | POA: Diagnosis not present

## 2019-12-03 DIAGNOSIS — C3491 Malignant neoplasm of unspecified part of right bronchus or lung: Secondary | ICD-10-CM

## 2019-12-03 DIAGNOSIS — E11621 Type 2 diabetes mellitus with foot ulcer: Secondary | ICD-10-CM | POA: Insufficient documentation

## 2019-12-03 DIAGNOSIS — L02612 Cutaneous abscess of left foot: Secondary | ICD-10-CM

## 2019-12-03 DIAGNOSIS — M21372 Foot drop, left foot: Secondary | ICD-10-CM | POA: Diagnosis not present

## 2019-12-03 DIAGNOSIS — L97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scle: Secondary | ICD-10-CM | POA: Insufficient documentation

## 2019-12-03 DIAGNOSIS — G4730 Sleep apnea, unspecified: Secondary | ICD-10-CM | POA: Diagnosis not present

## 2019-12-03 DIAGNOSIS — E1122 Type 2 diabetes mellitus with diabetic chronic kidney disease: Secondary | ICD-10-CM | POA: Diagnosis not present

## 2019-12-03 DIAGNOSIS — C3431 Malignant neoplasm of lower lobe, right bronchus or lung: Secondary | ICD-10-CM | POA: Diagnosis not present

## 2019-12-03 LAB — CMP (CANCER CENTER ONLY)
ALT: 7 U/L (ref 0–44)
AST: 8 U/L — ABNORMAL LOW (ref 15–41)
Albumin: 2.7 g/dL — ABNORMAL LOW (ref 3.5–5.0)
Alkaline Phosphatase: 98 U/L (ref 38–126)
Anion gap: 9 (ref 5–15)
BUN: 21 mg/dL (ref 8–23)
CO2: 26 mmol/L (ref 22–32)
Calcium: 9.3 mg/dL (ref 8.9–10.3)
Chloride: 96 mmol/L — ABNORMAL LOW (ref 98–111)
Creatinine: 1.81 mg/dL — ABNORMAL HIGH (ref 0.61–1.24)
GFR, Est AFR Am: 42 mL/min — ABNORMAL LOW (ref >60)
GFR, Estimated: 36 mL/min — ABNORMAL LOW (ref >60)
Glucose, Bld: 490 mg/dL — ABNORMAL HIGH (ref 70–99)
Potassium: 4.9 mmol/L (ref 3.5–5.1)
Sodium: 131 mmol/L — ABNORMAL LOW (ref 135–145)
Total Bilirubin: 0.3 mg/dL (ref 0.3–1.2)
Total Protein: 6.5 g/dL (ref 6.5–8.1)

## 2019-12-03 LAB — CBC WITH DIFFERENTIAL (CANCER CENTER ONLY)
Abs Immature Granulocytes: 0.17 10*3/uL — ABNORMAL HIGH (ref 0.00–0.07)
Basophils Absolute: 0 10*3/uL (ref 0.0–0.1)
Basophils Relative: 0 %
Eosinophils Absolute: 0 10*3/uL (ref 0.0–0.5)
Eosinophils Relative: 0 %
HCT: 27.2 % — ABNORMAL LOW (ref 39.0–52.0)
Hemoglobin: 9.2 g/dL — ABNORMAL LOW (ref 13.0–17.0)
Immature Granulocytes: 2 %
Lymphocytes Relative: 6 %
Lymphs Abs: 0.5 10*3/uL — ABNORMAL LOW (ref 0.7–4.0)
MCH: 27.9 pg (ref 26.0–34.0)
MCHC: 33.8 g/dL (ref 30.0–36.0)
MCV: 82.4 fL (ref 80.0–100.0)
Monocytes Absolute: 0.9 10*3/uL (ref 0.1–1.0)
Monocytes Relative: 11 %
Neutro Abs: 6.5 10*3/uL (ref 1.7–7.7)
Neutrophils Relative %: 81 %
Platelet Count: 311 10*3/uL (ref 150–400)
RBC: 3.3 MIL/uL — ABNORMAL LOW (ref 4.22–5.81)
RDW: 14.6 % (ref 11.5–15.5)
WBC Count: 8.1 10*3/uL (ref 4.0–10.5)
nRBC: 0 % (ref 0.0–0.2)

## 2019-12-03 MED ORDER — HEPARIN SOD (PORK) LOCK FLUSH 100 UNIT/ML IV SOLN
500.0000 [IU] | Freq: Once | INTRAVENOUS | Status: AC | PRN
  Administered 2019-12-03: 500 [IU]
  Filled 2019-12-03: qty 5

## 2019-12-03 MED ORDER — SODIUM CHLORIDE 0.9% FLUSH
10.0000 mL | INTRAVENOUS | Status: DC | PRN
  Administered 2019-12-03: 10 mL
  Filled 2019-12-03: qty 10

## 2019-12-03 NOTE — Telephone Encounter (Signed)
I called the patient regarding his blood sugar today.  The patient's blood sugar was 490 on routine labs today.  The patient states that he was running late for his appointment today and did not take his insulin.  He has since checked his blood sugar upon returning home and took his insulin as prescribed.  He is asymptomatic regarding hyperglycemia.  I strongly encouraged to monitor his blood sugar closer at home.  I reviewed with him that if he experiences signs and symptoms of hyperglycemia and/or no improvement in his blood sugar that he needs to be evaluated immediately.  The patient expressed understanding.

## 2019-12-03 NOTE — Progress Notes (Signed)
[facility] [phone]   [phone]  OFFICE PROGRESS NOTE  [name], MD [place] [address]  DIAGNOSIS: Limited stage small cell lung cancer.He presented with a large right lower lobe/right hilar mass with obstruction of the right lower lobe bronchus. The patient also has associated bulky right lower paratracheal and subcarinal adenopathy.He was diagnosed in July 2021.  PRIOR THERAPY: 1)weekly concurrent chemoradiation with carboplatin for an AUC of 2 and paclitaxel 45 mg/m.First dose 10/29/2019. Status post 1 cycle. Discontinued due to change in diagnosis.  CURRENT THERAPY: Systemic chemotherapy with carboplatin for an AUC of 5 on day 1 and etoposide 100 mg per metered squared on days 1, 2, and 3 IV every 3 weeks with Neulasta support. First dose expected on 11/12/2019. Status post 1 cycle. Undergoing concurrent radiation under the care of Dr. Lisbeth Renshaw for the obstructive lung mass.   INTERVAL HISTORY: [name] 74 y.o. male returns to the clinic today for follow-up visit.  The patient is feeling fine today with no concerning complaints except for fatigue and shortness of breath with exertion.  He was admitted to the hospital recently with pancytopenia and severe sepsis with acute ulcer and infectious process with cellulitis of the left foot.  He underwent debridement followed by amputation by Dr. Sharol Given.  The patient is currently on treatment with amoxicillin and Cipro.  He still has an open wound.  He denied having any chest pain, shortness of breath, cough or hemoptysis.  He denied having any fever or chills.  He has no nausea, vomiting, diarrhea or constipation.  He has no headache or visual changes.  The patient was supposed to start cycle #2 of his treatment today.  MEDICAL HISTORY: Past Medical History:  Diagnosis Date  . Allergic rhinitis due to pollen   . Cancer ([place])   . Chronic kidney disease,  stage III (moderate) 12/19/2012  . Depression   . Diabetes mellitus ([place])   . Diverticulosis   . Erectile dysfunction associated with type 2 diabetes mellitus ([place]) 05/28/2015  . Former very heavy cigarette smoker (more than 40 per day) 10/07/2014  . GERD (gastroesophageal reflux disease)   . Gout   . Hyperlipidemia   . Hypertension   . Hypogonadism male 06/14/2012  . Iron deficiency   . Memory loss   . Osteoarthritis   . Personal history of colonic polyps    1996    ALLERGIES:  is allergic to tetracycline.  MEDICATIONS:  Current Outpatient Medications  Medication Sig Dispense Refill  . acetaminophen (QC ACETAMINOPHEN 8HR ARTH PAIN) 650 MG CR tablet Take 650 mg by mouth every 8 (eight) hours as needed for pain.    [name] Kitchen amoxicillin (AMOXIL) 500 MG capsule Take 1 capsule (500 mg total) by mouth every 8 (eight) hours. 6 capsule 0  . ampicillin (PRINCIPEN) 500 MG capsule Take 500 mg by mouth daily as needed (flare up).     . Ascorbic Acid (VITAMIN C) 1000 MG tablet Take 1,000 mg by mouth daily.    . cholecalciferol (VITAMIN D) 1000 UNITS tablet Take 1,000 Units by mouth daily.      . ciprofloxacin (CIPRO) 500 MG tablet Take 1 tablet (500 mg total) by mouth 2 (two) times daily. 4 tablet 0  . donepezil (ARICEPT) 10 MG tablet TAKE 1 TABLET BY MOUTH AT BEDTIME (Patient taking differently: Take 10 mg by mouth at bedtime. ) 90 tablet 3  . fluticasone (FLONASE) 50  MCG/ACT nasal spray PLACE 2 SPRAYS IN EACH NOSTRIL DAILY (Patient taking differently: Place 2 sprays into both nostrils daily as needed for allergies or rhinitis. ) 48 mL 3  . guaiFENesin-dextromethorphan (ROBITUSSIN DM) 100-10 MG/5ML syrup Take 5 mLs by mouth every 6 (six) hours as needed for cough. 118 mL 0  . Hyprom-Naphaz-Polysorb-Zn Sulf (CLEAR EYES COMPLETE OP) Place 1 drop into both eyes daily as needed (dry eyes).    . Insulin Pen Needle (PEN NEEDLES) 31G X 5 MM MISC 1 each by Does not apply route 3 (three) times daily. To inject  insulin E11.42 90 each 2  . insulin regular human CONCENTRATED (HUMULIN R U-500 KWIKPEN) 500 UNIT/ML kwikpen Inject under skin 80-90 units before b'fast, 60 units before lunch, 80-90 units vefore dinner (Patient taking differently: Inject 20-40 Units into the skin See admin instructions. Takes 35-40 units before breakfast, 20 units at lunch and 35-40 units at night) 6 pen 5  . lidocaine-prilocaine (EMLA) cream Apply 1 application topically as needed. (Patient taking differently: Apply 1 application topically as needed (port access). ) 30 g 2  . memantine (NAMENDA) 10 MG tablet TAKE 1 TABLET BY MOUTH TWICE A DAY (Patient taking differently: Take 10 mg by mouth 2 (two) times daily. ) 180 tablet 3  . metoprolol succinate (TOPROL-XL) 25 MG 24 hr tablet Take 0.5 tablets (12.5 mg total) by mouth daily. 30 tablet 3  . montelukast (SINGULAIR) 10 MG tablet Take 1 tablet (10 mg total) by mouth at bedtime. 30 tablet 3  . omeprazole (PRILOSEC) 40 MG capsule Take 1 capsule (40 mg total) by mouth 2 (two) times daily before a meal. (Patient taking differently: Take 40 mg by mouth daily. ) 180 capsule 1  . potassium chloride SA (KLOR-CON) 20 MEQ tablet Take 2 tablets (40 mEq total) by mouth 2 (two) times daily. 8 tablet 0  . prochlorperazine (COMPAZINE) 10 MG tablet Take 1 tablet (10 mg total) by mouth every 6 (six) hours as needed. (Patient taking differently: Take 10 mg by mouth every 6 (six) hours as needed for nausea. ) 30 tablet 2  . simvastatin (ZOCOR) 40 MG tablet TAKE 1 TABLET BY MOUTH AT BEDTIME (Patient taking differently: Take 40 mg by mouth every evening. ) 90 tablet 2  . sucralfate (CARAFATE) 1 g tablet Take 1 tablet (1 g total) by mouth 4 (four) times daily. Dissolve each tablet in 15 cc water before use. 120 tablet 2  . venlafaxine XR (EFFEXOR-XR) 75 MG 24 hr capsule TAKE 1 CAPSULE BY MOUTH EVERY MORNING AND 2 CAPSULES AT BEDTIME 270 capsule 1   No current facility-administered medications for this  visit.    SURGICAL HISTORY:  Past Surgical History:  Procedure Laterality Date  . BRONCHIAL NEEDLE ASPIRATION BIOPSY  10/17/2019   Procedure: BRONCHIAL NEEDLE ASPIRATION BIOPSIES;  Surgeon: Juanito Doom, MD;  Location: [facility];  Service: Cardiopulmonary;;  . CARDIAC CATHETERIZATION  11/2011   [facility]  . CHOLECYSTECTOMY  1980  . ENDOBRONCHIAL ULTRASOUND Bilateral 10/17/2019   Procedure: ENDOBRONCHIAL ULTRASOUND;  Surgeon: Juanito Doom, MD;  Location: [facility];  Service: Cardiopulmonary;  Laterality: Bilateral;  . ESOPHAGOGASTRODUODENOSCOPY (EGD) WITH PROPOFOL N/A 09/27/2017   Procedure: ESOPHAGOGASTRODUODENOSCOPY (EGD) WITH PROPOFOL;  Surgeon: Lin Landsman, MD;  Location: [facility];  Service: Gastroenterology;  Laterality: N/A;  . EYE SURGERY Right 07/29/2016   [facility] - Dr. Manuella Ghazi  . EYE SURGERY Left 08/23/2106   [facility] - Dr. Manuella Ghazi  . HEMOSTASIS CONTROL  10/17/2019  Procedure: HEMOSTASIS CONTROL;  Surgeon: Juanito Doom, MD;  Location: [facility];  Service: Cardiopulmonary;;  . IR IMAGING GUIDED PORT INSERTION  11/30/2019  . IRRIGATION AND DEBRIDEMENT ABSCESS Left 11/23/2019   Procedure: IRRIGATION AND DEBRIDEMENT ABSCESS;  Surgeon: Melina Schools, MD;  Location: [facility];  Service: Orthopedics;  Laterality: Left;  . TOTAL HIP ARTHROPLASTY  2009   Dr. Gladstone Lighter  . TOTAL HIP ARTHROPLASTY      REVIEW OF SYSTEMS:  A comprehensive review of systems was negative except for: Constitutional: positive for fatigue Respiratory: positive for dyspnea on exertion   PHYSICAL EXAMINATION: General appearance: alert, cooperative, fatigued and no distress Head: Normocephalic, without obvious abnormality, atraumatic Neck: no adenopathy, no JVD, supple, symmetrical, trachea midline and thyroid not enlarged, symmetric, no tenderness/mass/nodules Lymph nodes: Cervical, supraclavicular, and axillary nodes normal. Resp: clear to auscultation bilaterally Back: symmetric, no  curvature. ROM normal. No CVA tenderness. Cardio: regular rate and rhythm, S1, S2 normal, no murmur, click, rub or gallop GI: soft, non-tender; bowel sounds normal; no masses,  no organomegaly Extremities: extremities normal, atraumatic, no cyanosis or edema  ECOG PERFORMANCE STATUS: 1 - Symptomatic but completely ambulatory  Blood pressure 111/68, pulse (!) 128, temperature 98.8 F (37.1 C), temperature source Tympanic, resp. rate 17, height 5\' 11"  (1.803 m), weight 188 lb (85.3 kg), SpO2 98 %.  LABORATORY DATA: Lab Results  Component Value Date   WBC 8.1 12/03/2019   HGB 9.2 (L) 12/03/2019   HCT 27.2 (L) 12/03/2019   MCV 82.4 12/03/2019   PLT 311 12/03/2019      Chemistry      Component Value Date/Time   NA 133 (L) 11/28/2019 0550   NA 130 (L) 11/25/2011 1119   NA 137 11/16/2011 0513   K 4.0 11/28/2019 0550   K 4.1 11/16/2011 0513   CL 93 (L) 11/28/2019 0550   CL 100 11/16/2011 0513   CO2 30 11/28/2019 0550   CO2 29 11/16/2011 0513   BUN 15 11/28/2019 0550   BUN 25 11/25/2011 1119   BUN 15 11/16/2011 0513   CREATININE 1.31 (H) 11/28/2019 0550   CREATININE 1.37 (H) 11/19/2019 1031   CREATININE 1.26 11/16/2011 0513      Component Value Date/Time   CALCIUM 8.5 (L) 11/28/2019 0550   CALCIUM 9.1 11/16/2011 0513   ALKPHOS 81 11/28/2019 0550   AST 11 (L) 11/28/2019 0550   AST 6 (L) 11/19/2019 1031   ALT 12 11/28/2019 0550   ALT 8 11/19/2019 1031   BILITOT 0.8 11/28/2019 0550   BILITOT 0.6 11/19/2019 1031       RADIOGRAPHIC STUDIES: DG Chest 2 View  Result Date: 11/22/2019 CLINICAL DATA:  Suspected sepsis EXAM: CHEST - 2 VIEW COMPARISON:  10/12/2019, CT 11/15/2019 FINDINGS: No focal consolidation or pleural effusion. Normal heart size. Decreased right hilar mass. No pneumothorax. IMPRESSION: No acute airspace disease. Interval decrease in size of right hilar mass Electronically Signed   By: Donavan Foil M.D.   On: 11/22/2019 21:07   CT Angio Chest PE W/Cm &/Or Wo  Cm  Result Date: 11/15/2019 CLINICAL DATA:  Syncope. EXAM: CT ANGIOGRAPHY CHEST WITH CONTRAST TECHNIQUE: Multidetector CT imaging of the chest was performed using the standard protocol during bolus administration of intravenous contrast. Multiplanar CT image reconstructions and MIPs were obtained to evaluate the vascular anatomy. CONTRAST:  45mL OMNIPAQUE IOHEXOL 350 MG/ML SOLN COMPARISON:  CT chest dated October 11, 2019 FINDINGS: Cardiovascular: Contrast injection is sufficient to demonstrate satisfactory opacification of the pulmonary  arteries to the segmental level. There is no pulmonary embolus or evidence of right heart strain. The size of the main pulmonary artery is normal. Heart size is normal, with no pericardial effusion. There are coronary artery calcifications. There are minimal atherosclerotic changes of the thoracic aorta without evidence for an aneurysm. There is no significant pericardial effusion. Mediastinum/Nodes: --again noted are pathologically enlarged mediastinal lymph nodes. The dominant lymph node measures approximately 2.4 x 4.3 cm (previously measuring 2.7 by 6 cm). --there are pathologically enlarged right hilar lymph nodes. -- No axillary lymphadenopathy. -- No supraclavicular lymphadenopathy. --there is a right-sided hypoattenuating thyroid nodule measuring approximately 1.5 cm (axial series 4, image 9). -  Unremarkable esophagus. Lungs/Pleura: Again noted is a right hilar mass. This mass has demonstrated significant interval decrease in size and currently measures approximately 4.7 x 4.8 cm (previously measuring approximately 6.1 x 5.8 cm). There is new cavitation within the central aspect of this mass. There is no pneumothorax. No large pleural effusion. Upper Abdomen: Contrast bolus timing is not optimized for evaluation of the abdominal organs. The patient is status post prior cholecystectomy. There is a probable nonobstructing stone in the upper pole the left kidney. There is variant  hepatic arterial anatomy with a replaced common hepatic artery. Musculoskeletal: There are healing fractures of the anterior there through fifth ribs on the patient's left. There is no new acute displaced fracture. Review of the MIP images confirms the above findings. IMPRESSION: 1. No evidence for acute pulmonary embolism. 2. Findings consistent with a positive response to treatment as evidence by interval decrease in size of the right hilar mass and mediastinal lymph nodes. 3. Right-sided hypoattenuating thyroid nodule measuring approximately 1.5 cm. Outpatient follow-up ultrasound is recommended if it has not already been performed.(Ref: J Am Coll Radiol. 2015 Feb;12(2): 143-50). 4. Healing left-sided rib fractures. Aortic Atherosclerosis (ICD10-I70.0). Electronically Signed   By: Constance Holster M.D.   On: 11/15/2019 18:37   MR FOOT LEFT W WO CONTRAST  Result Date: 11/23/2019 CLINICAL DATA:  Chronic left foot ulcer with erythema and recent drainage. History of lung cancer. Concern for osteomyelitis. EXAM: MRI OF THE LEFT FOREFOOT WITHOUT AND WITH CONTRAST TECHNIQUE: Multiplanar, multisequence MR imaging of the left forefoot was performed both before and after administration of intravenous contrast. CONTRAST:  63mL GADAVIST GADOBUTROL 1 MMOL/ML IV SOLN COMPARISON:  Radiographs 11/22/2019 and 02/12/2019 FINDINGS: Bones/Joint/Cartilage There is soft tissue ulceration along the plantar aspect of the midfoot at the level of the 4th and 5th metatarsal bases. There appears to be mild soft tissue ulceration along the plantar aspect of the 5th metatarsal head as well. No underlying cortical destruction, marrow edema or suspicious marrow enhancement. There are mild midfoot and 1st MTP degenerative changes without significant joint effusions. The tibial sesamoid of the 1st metatarsal is bipartite. The distal toes are incompletely visualized. Possible nondisplaced transverse fracture through the base of the 5th  proximal phalanx. Ligaments Intact Lisfranc ligament. 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 No focal intramuscular fluid collection or tenosynovitis. The ankle tendons appear intact. Soft tissues As above, apparent soft tissue ulceration along the plantar aspect of the 4th and 5th metatarsal bases and the 5th metatarsal head. There is associated heterogeneous enhancement following contrast, but no evidence of drainable fluid collection or foreign body. IMPRESSION: 1. Soft tissue ulceration along the plantar aspect of the midfoot at the level of the 4th and 5th metatarsal bases and the 5th metatarsal head.  No evidence of drainable fluid collection or foreign body. 2. No evidence of osteomyelitis. 3. Possible nondisplaced transverse fracture through the base of the 5th proximal phalanx. Electronically Signed   By: Richardean Sale M.D.   On: 11/23/2019 16:31   DG CHEST PORT 1 VIEW  Result Date: 11/26/2019 CLINICAL DATA:  Foot ulcer.  Sepsis. EXAM: PORTABLE CHEST 1 VIEW COMPARISON:  11/22/2019 FINDINGS: The cardiac silhouette, mediastinal and hilar contours are within normal limits and stable. Stable radiation changes are noted in the right hilum/paramediastinal lung. No acute pulmonary findings or pleural effusions. No worrisome pulmonary lesions. The bony thorax is intact. IMPRESSION: 1. Stable radiation changes in the right hilum/paramediastinal lung. 2. No acute cardiopulmonary findings. Electronically Signed   By: Marijo Sanes M.D.   On: 11/26/2019 15:45   DG Foot Complete Left  Result Date: 11/22/2019 CLINICAL DATA:  Foot pain EXAM: LEFT FOOT - COMPLETE 3+ VIEW COMPARISON:  02/12/2019 FINDINGS: No acute displaced fracture or malalignment. Soft tissue ulceration at the mid sole of the foot. No radiopaque foreign body. Small plantar calcaneal spur. No osseous destructive change. Mild degenerative change at the first MTP joint.  IMPRESSION: No acute osseous abnormality. Soft tissue ulceration at the mid sole of the foot. Electronically Signed   By: Donavan Foil M.D.   On: 11/22/2019 21:38   VAS [place] ABI WITH/WO TBI  Result Date: 11/24/2019 LOWER EXTREMITY DOPPLER STUDY Indications: Ulceration of LT foot. High Risk Factors: Hypertension, Diabetes.  Comparison Study: No prior studies. Performing Technologist: Darlin Coco  Examination Guidelines: A complete evaluation includes at minimum, Doppler waveform signals and systolic blood pressure reading at the level of bilateral brachial, anterior tibial, and posterior tibial arteries, when vessel segments are accessible. Bilateral testing is considered an integral part of a complete examination. Photoelectric Plethysmograph (PPG) waveforms and toe systolic pressure readings are included as required and additional duplex testing as needed. Limited examinations for reoccurring indications may be performed as noted.  ABI Findings: +---------+------------------+-----+---------+--------+ Right    Rt Pressure (mmHg)IndexWaveform Comment  +---------+------------------+-----+---------+--------+ Brachial 154                    triphasic         +---------+------------------+-----+---------+--------+ PTA      161               1.05 triphasic         +---------+------------------+-----+---------+--------+ DP       143               0.93 triphasic         +---------+------------------+-----+---------+--------+ Great Toe100               0.65 Abnormal          +---------+------------------+-----+---------+--------+ +---------+------------------+-----+---------+-------+ Left     Lt Pressure (mmHg)IndexWaveform Comment +---------+------------------+-----+---------+-------+ Brachial 152                    triphasic        +---------+------------------+-----+---------+-------+ PTA      155               1.01 triphasic         +---------+------------------+-----+---------+-------+ DP       114               0.74 triphasic        +---------+------------------+-----+---------+-------+ Great Toe120               0.78 Normal           +---------+------------------+-----+---------+-------+ +-------+-----------+-----------+------------+------------+  ABI/TBIToday's ABIToday's TBIPrevious ABIPrevious TBI +-------+-----------+-----------+------------+------------+ Right  1.05       0.65                                +-------+-----------+-----------+------------+------------+ Left   1.01       0.78                                +-------+-----------+-----------+------------+------------+  Summary: Right: Resting right ankle-brachial index is within normal range. No evidence of significant right lower extremity arterial disease. The right toe-brachial index is mildly abnormal. Left: Resting left ankle-brachial index is within normal range. No evidence of significant left lower extremity arterial disease. The left toe-brachial index is normal.  *See table(s) above for measurements and observations.  Electronically signed by Ruta [facility] MD on 11/24/2019 at 12:11:40 PM.    Final    IR IMAGING GUIDED PORT INSERTION  Result Date: 11/30/2019 INDICATION: 74 year old with lung cancer. Port-A-Cath needed for systemic therapy. EXAM: FLUOROSCOPIC AND ULTRASOUND GUIDED PLACEMENT OF A SUBCUTANEOUS PORT COMPARISON:  None. MEDICATIONS: Ancef 2 g; The antibiotic was administered within an appropriate time interval prior to skin puncture. ANESTHESIA/SEDATION: Versed 2.0 mg IV; Fentanyl 100 mcg IV; Moderate Sedation Time:  30 minutes The patient was continuously monitored during the procedure by the interventional radiology nurse under my direct supervision. FLUOROSCOPY TIME:  18 seconds, 3 mGy COMPLICATIONS: None immediate. PROCEDURE: The procedure, risks, benefits, and alternatives were explained to the patient. Questions  regarding the procedure were encouraged and answered. The patient understands and consents to the procedure. Patient was placed supine on the interventional table. Ultrasound confirmed a patent right internal jugular vein. Ultrasound image was saved for documentation. The right chest and neck were cleaned with a skin antiseptic and a sterile drape was placed. Maximal barrier sterile technique was utilized including caps, mask, sterile gowns, sterile gloves, sterile drape, hand hygiene and skin antiseptic. The right neck was anesthetized with 1% lidocaine. Small incision was made in the right neck with a blade. Micropuncture set was placed in the right internal jugular vein with ultrasound guidance. The micropuncture wire was used for measurement purposes. The right chest was anesthetized with 1% lidocaine with epinephrine. #15 blade was used to make an incision and a subcutaneous port pocket was formed. [place] was assembled. Subcutaneous tunnel was formed with a stiff tunneling device. The port catheter was brought through the subcutaneous tunnel. The port was placed in the subcutaneous pocket. The micropuncture set was exchanged for a peel-away sheath. The catheter was placed through the peel-away sheath and the tip was positioned at the superior cavoatrial junction. Catheter placement was confirmed with fluoroscopy. The port was accessed and flushed with heparinized saline. The port pocket was closed using two layers of absorbable sutures and Dermabond. The vein skin site was closed using a single layer of absorbable suture and Dermabond. Sterile dressings were applied. Patient tolerated the procedure well without an immediate complication. Ultrasound and fluoroscopic images were taken and saved for this procedure. IMPRESSION: Placement of a subcutaneous port device. Catheter tip at the superior cavoatrial junction. Electronically Signed   By: Markus Daft M.D.   On: 11/30/2019 10:53    ASSESSMENT  AND PLAN: This is a very pleasant 73 years old white male recently diagnosed with limited stage small cell lung cancer presented with large right lower lobe/right hilar  mass with obstruction of the right lower lobe bronchus and bulky right lower lobe paratracheal and subcarinal adenopathy diagnosed in July 2021. The patient is currently undergoing systemic chemotherapy with carboplatin and etoposide status post 1 cycle.  He tolerated the first cycle well except for the pancytopenia and recent admission to the hospital with cellulitis of the lower extremities status post I&D of the left plantar foot abscess with application of wound VAC. The patient still recovering from the recent procedure and he is currently on amoxicillin and Cipro. I recommended for him to delay the start of cycle number 2 x 1 more week until better healing of his wound. He will start cycle #2 next week. For the tachycardia, the patient mentions that he is anxious about the treatment today.  We will continue to monitor closely. He was advised to call immediately if he has any other concerning symptoms in the interval. The patient voices understanding of current disease status and treatment options and is in agreement with the current care plan.  All questions were answered. The patient knows to call the clinic with any problems, questions or concerns. We can certainly see the patient much sooner if necessary.  The total time spent in the appointment was 30 minutes.  Disclaimer: This note was dictated with voice recognition software. Similar sounding words can inadvertently be transcribed and may not be corrected upon review.

## 2019-12-04 ENCOUNTER — Inpatient Hospital Stay: Payer: Medicare Other

## 2019-12-05 ENCOUNTER — Inpatient Hospital Stay: Payer: Medicare Other

## 2019-12-05 ENCOUNTER — Ambulatory Visit: Payer: Self-pay | Admitting: *Deleted

## 2019-12-05 DIAGNOSIS — J45909 Unspecified asthma, uncomplicated: Secondary | ICD-10-CM | POA: Diagnosis not present

## 2019-12-05 DIAGNOSIS — E1165 Type 2 diabetes mellitus with hyperglycemia: Secondary | ICD-10-CM | POA: Diagnosis not present

## 2019-12-05 DIAGNOSIS — C3491 Malignant neoplasm of unspecified part of right bronchus or lung: Secondary | ICD-10-CM | POA: Diagnosis not present

## 2019-12-05 DIAGNOSIS — W010XXD Fall on same level from slipping, tripping and stumbling without subsequent striking against object, subsequent encounter: Secondary | ICD-10-CM | POA: Diagnosis not present

## 2019-12-05 DIAGNOSIS — N1831 Chronic kidney disease, stage 3a: Secondary | ICD-10-CM | POA: Diagnosis not present

## 2019-12-05 DIAGNOSIS — R000 Tachycardia, unspecified: Secondary | ICD-10-CM | POA: Diagnosis not present

## 2019-12-05 DIAGNOSIS — Z87891 Personal history of nicotine dependence: Secondary | ICD-10-CM | POA: Diagnosis not present

## 2019-12-05 DIAGNOSIS — S2232XD Fracture of one rib, left side, subsequent encounter for fracture with routine healing: Secondary | ICD-10-CM | POA: Diagnosis not present

## 2019-12-05 DIAGNOSIS — Z9181 History of falling: Secondary | ICD-10-CM | POA: Diagnosis not present

## 2019-12-05 DIAGNOSIS — E11621 Type 2 diabetes mellitus with foot ulcer: Secondary | ICD-10-CM | POA: Diagnosis not present

## 2019-12-05 DIAGNOSIS — Z794 Long term (current) use of insulin: Secondary | ICD-10-CM | POA: Diagnosis not present

## 2019-12-05 DIAGNOSIS — L97429 Non-pressure chronic ulcer of left heel and midfoot with unspecified severity: Secondary | ICD-10-CM | POA: Diagnosis not present

## 2019-12-05 DIAGNOSIS — G4733 Obstructive sleep apnea (adult) (pediatric): Secondary | ICD-10-CM | POA: Diagnosis not present

## 2019-12-05 DIAGNOSIS — I129 Hypertensive chronic kidney disease with stage 1 through stage 4 chronic kidney disease, or unspecified chronic kidney disease: Secondary | ICD-10-CM | POA: Diagnosis not present

## 2019-12-05 DIAGNOSIS — L02612 Cutaneous abscess of left foot: Secondary | ICD-10-CM | POA: Diagnosis not present

## 2019-12-05 DIAGNOSIS — E041 Nontoxic single thyroid nodule: Secondary | ICD-10-CM | POA: Diagnosis not present

## 2019-12-05 DIAGNOSIS — E1142 Type 2 diabetes mellitus with diabetic polyneuropathy: Secondary | ICD-10-CM | POA: Diagnosis not present

## 2019-12-05 NOTE — Progress Notes (Signed)
NAITHAN, DELAGE ([id number]) Visit Report for 12/03/2019 Abuse/Suicide Risk Screen Details Patient Name: [name], [name] Date of Service: 12/03/2019 9:15 AM Medical Record Number: [id number] Patient Account Number: [id number] Date of Birth/Sex: November 07, 1945 (74 y.o. M) Treating RN: Cornell Barman Primary Care Detrich Rakestraw: [name] Other Clinician: Referring Nicolet Griffy: Referral, Self Treating Janan Bogie/Extender: [name], HOYT Weeks in Treatment: 0 Abuse/Suicide Risk Screen Items Answer ABUSE RISK SCREEN: Has anyone close to you tried to hurt or harm you recentlyo No Do you feel uncomfortable with anyone in your familyo No Has anyone forced you do things that you didnot want to doo No Electronic Signature(s) Signed: 12/04/2019 11:15:54 AM By: Darci Needle Signed: 12/04/2019 7:43:28 PM By: Gretta Cool, BSN, RN, CWS, Kim RN, BSN Entered By: Darci Needle on 12/03/2019 10:37:18 [name] ([id number]) -------------------------------------------------------------------------------- Activities of Daily Living Details Patient Name: [name] Date of Service: 12/03/2019 9:15 AM Medical Record Number: [id number] Patient Account Number: [id number] Date of Birth/Sex: June 15, 1945 (74 y.o. M) Treating RN: Cornell Barman Primary Care Alfhild Partch: [name] Other Clinician: Referring Ubaldo Daywalt: Referral, Self Treating Brittain Smithey/Extender: [name], HOYT Weeks in Treatment: 0 Activities of Daily Living Items Answer Activities of Daily Living (Please select one for each item) Drive Automobile Not Able Take Medications Completely Able Use Telephone Completely Able Care for Appearance Completely Able Use Toilet Completely Able Bath / Shower Completely Able Dress Self Completely Able Feed Self Completely Able Walk Not Able Get In / Out Bed Completely Able Housework Completely Able Prepare Meals Completely [place] for Self Need Assistance Electronic  Signature(s) Signed: 12/04/2019 11:15:54 AM By: Darci Needle Signed: 12/04/2019 7:43:28 PM By: Gretta Cool, BSN, RN, CWS, Kim RN, BSN Entered By: Darci Needle on 12/03/2019 10:43:34 [name] ([id number]) -------------------------------------------------------------------------------- Education Screening Details Patient Name: [name] Date of Service: 12/03/2019 9:15 AM Medical Record Number: [id number] Patient Account Number: [id number] Date of Birth/Sex: 1945/06/26 (74 y.o. M) Treating RN: Cornell Barman Primary Care Catlyn Shipton: [name] Other Clinician: Referring Jonae Renshaw: Referral, Self Treating Chaitra Mast/Extender: Melburn Hake, HOYT Weeks in Treatment: 0 Learning Preferences/Education Level/Primary Language Learning Preference: Explanation Highest Education Level: High School Preferred Language: English Cognitive Barrier Language Barrier: No Translator Needed: No Memory Deficit: No Emotional Barrier: No Cultural/Religious Beliefs Affecting Medical Care: No Physical Barrier Impaired Vision: No Impaired Hearing: No Decreased Hand dexterity: No Knowledge/Comprehension Knowledge Level: High Comprehension Level: High Ability to understand written instructions: High Ability to understand verbal instructions: High Motivation Anxiety Level: Calm Cooperation: Cooperative Education Importance: Acknowledges Need Interest in Health Problems: Asks Questions Perception: Coherent Willingness to Engage in Self-Management High Activities: Readiness to Engage in Self-Management High Activities: Electronic Signature(s) Signed: 12/04/2019 11:15:54 AM By: Darci Needle Signed: 12/04/2019 7:43:28 PM By: Gretta Cool, BSN, RN, CWS, Kim RN, BSN Entered By: Darci Needle on 12/03/2019 10:43:54 [name] ([id number]) -------------------------------------------------------------------------------- Fall Risk Assessment Details Patient Name: [name] Date of  Service: 12/03/2019 9:15 AM Medical Record Number: [id number] Patient Account Number: [id number] Date of Birth/Sex: 12/03/1945 (74 y.o. M) Treating RN: Cornell Barman Primary Care Roshawn Ayala: [name] Other Clinician: Referring Genesis Paget: Referral, Self Treating Chade Pitner/Extender: [name], HOYT Weeks in Treatment: 0 Fall Risk Assessment Items Have you had 2 or more falls in the last 12 monthso 0 Yes Have you had any fall that resulted in injury in the last 12 monthso 0 No FALLS RISK SCREEN History of falling - immediate or within 3 months 0 No Secondary diagnosis (Do you have 2 or more medical  diagnoseso) 0 No Ambulatory aid None/bed rest/wheelchair/nurse 0 No Crutches/cane/walker 0 No Furniture 0 No Intravenous therapy Access/Saline/Heparin Lock 0 No Gait/Transferring Normal/ bed rest/ wheelchair 0 No Weak (short steps with or without shuffle, stooped but able to lift head while walking, may 0 No seek support from furniture) Impaired (short steps with shuffle, may have difficulty arising from chair, head down, impaired 0 No balance) Mental Status Oriented to own ability 0 No Electronic Signature(s) Signed: 12/04/2019 11:15:54 AM By: Darci Needle Signed: 12/04/2019 7:43:28 PM By: Gretta Cool, BSN, RN, CWS, Kim RN, BSN Entered By: Darci Needle on 12/03/2019 10:44:03 [name] ([id number]) -------------------------------------------------------------------------------- Foot Assessment Details Patient Name: [name] Date of Service: 12/03/2019 9:15 AM Medical Record Number: [id number] Patient Account Number: [id number] Date of Birth/Sex: January 14, 1946 (74 y.o. M) Treating RN: Cornell Barman Primary Care Rebakah Cokley: [name] Other Clinician: Referring Kenidy Crossland: Referral, Self Treating Anterio Scheel/Extender: [name], HOYT Weeks in Treatment: 0 Foot Assessment Items Site Locations + = Sensation present, - = Sensation absent, C = Callus, U = Ulcer R = Redness, W =  Warmth, M = Maceration, PU = Pre-ulcerative lesion F = Fissure, S = Swelling, D = Dryness Assessment Right: Left: Other Deformity: No No Prior Foot Ulcer: No No Prior Amputation: No No Charcot Joint: No No Ambulatory Status: Non-ambulatory Gait: Notes Non weight bearing at this time due to wound vac left foot Electronic Signature(s) Signed: 12/04/2019 11:15:54 AM By: Darci Needle Signed: 12/04/2019 7:43:28 PM By: Gretta Cool, BSN, RN, CWS, Kim RN, BSN Entered By: Darci Needle on 12/03/2019 10:40:11 [name] ([id number]) -------------------------------------------------------------------------------- Nutrition Risk Screening Details Patient Name: [name] Date of Service: 12/03/2019 9:15 AM Medical Record Number: [id number] Patient Account Number: [id number] Date of Birth/Sex: 07-20-1945 (74 y.o. M) Treating RN: Cornell Barman Primary Care Parilee Hally: [name] Other Clinician: Referring Calyn Sivils: Referral, Self Treating Mckala Pantaleon/Extender: [name], HOYT Weeks in Treatment: 0 Height (in): 71 Weight (lbs): 190 Body Mass Index (BMI): 26.5 Nutrition Risk Screening Items Score Screening NUTRITION RISK SCREEN: I have an illness or condition that made me change the kind and/or amount of food I eat 0 No I eat fewer than two meals per day 0 No I eat few fruits and vegetables, or milk products 0 No I have three or more drinks of beer, liquor or wine almost every day 0 No I have tooth or mouth problems that make it hard for me to eat 0 No I don't always have enough money to buy the food I need 0 No I eat alone most of the time 0 No I take three or more different prescribed or over-the-counter drugs a day 0 No Without wanting to, I have lost or gained 10 pounds in the last six months 0 No I am not always physically able to shop, cook and/or feed myself 0 No Nutrition Protocols Good Risk Protocol 0 No interventions needed Moderate Risk Protocol High Risk  Proctocol Risk Level: Good Risk Score: 0 Electronic Signature(s) Signed: 12/04/2019 11:15:54 AM By: Darci Needle Signed: 12/04/2019 7:43:28 PM By: Gretta Cool, BSN, RN, CWS, Kim RN, BSN Entered By: Darci Needle on 12/03/2019 10:38:49

## 2019-12-05 NOTE — Progress Notes (Signed)
[name], [name] ([id number]) Visit Report for 12/03/2019 Allergy List Details Patient Name: [name], [name] Date of Service: 12/03/2019 9:15 AM Medical Record Number: [id number] Patient Account Number: [id number] Date of Birth/Sex: 08-Nov-1945 (74 y.o. M) Treating RN: Cornell Barman Primary Care Chenae Brager: [name] Other Clinician: Referring Florie Carico: Referral, Self Treating Der Gagliano/Extender: [name], HOYT Weeks in Treatment: 0 Allergies Active Allergies tetracycline Allergy Notes Electronic Signature(s) Signed: 12/04/2019 11:15:54 AM By: Darci Needle Entered By: Darci Needle on 12/03/2019 10:20:18 [name] ([id number]) -------------------------------------------------------------------------------- Arrival Information Details Patient Name: [name] Date of Service: 12/03/2019 9:15 AM Medical Record Number: [id number] Patient Account Number: [id number] Date of Birth/Sex: 1946-04-09 (74 y.o. M) Treating RN: Cornell Barman Primary Care Philomene Haff: [name] Other Clinician: Referring Nahshon Reich: Referral, Self Treating Kenady Doxtater/Extender: Melburn Hake, HOYT Weeks in Treatment: 0 Visit Information Patient Arrived: Wheel Chair Arrival Time: 09:51 Accompanied By: self Transfer Assistance: None Patient Identification Verified: Yes Secondary Verification Process Completed: Yes History Since Last Visit All ordered tests and consults were completed: No Added or deleted any medications: No Any new allergies or adverse reactions: No Had a fall or experienced change in activities of daily living that may affect risk of falls: No Signs or symptoms of abuse/neglect since last visito No Hospitalized since last visit: No Implantable device outside of the clinic excluding cellular tissue based products placed in the center since last visit: No Has Dressing in Place as Prescribed: Yes Electronic Signature(s) Signed: 12/04/2019 11:15:54 AM By: Darci Needle Entered  By: Darci Needle on 12/03/2019 09:52:22 [name] ([id number]) -------------------------------------------------------------------------------- Clinic Level of Care Assessment Details Patient Name: [name] Date of Service: 12/03/2019 9:15 AM Medical Record Number: [id number] Patient Account Number: [id number] Date of Birth/Sex: 01-Nov-1945 (74 y.o. M) Treating RN: Cornell Barman Primary Care Soul Hackman: [name] Other Clinician: Referring Tayna Smethurst: Referral, Self Treating Jahrel Borthwick/Extender: [name], HOYT Weeks in Treatment: 0 Clinic Level of Care Assessment Items TOOL 1 Quantity Score []  - Use when EandM and Procedure is performed on INITIAL visit 0 ASSESSMENTS - Nursing Assessment / Reassessment X - General Physical Exam (combine w/ comprehensive assessment (listed just below) when performed on new 1 20 pt. evals) X- 1 25 Comprehensive Assessment (HX, ROS, Risk Assessments, Wounds Hx, etc.) ASSESSMENTS - Wound and Skin Assessment / Reassessment []  - Dermatologic / Skin Assessment (not related to wound area) 0 ASSESSMENTS - Ostomy and/or Continence Assessment and Care []  - Incontinence Assessment and Management 0 []  - 0 Ostomy Care Assessment and Management (repouching, etc.) PROCESS - Coordination of Care X - Simple Patient / Family Education for ongoing care 1 15 []  - 0 Complex (extensive) Patient / Family Education for ongoing care []  - 0 Staff obtains Programmer, systems, Records, Test Results / Process Orders []  - 0 Staff telephones HHA, Nursing Homes / Clarify orders / etc []  - 0 Routine Transfer to another Facility (non-emergent condition) []  - 0 Routine Hospital Admission (non-emergent condition) X- 1 15 New Admissions / Biomedical engineer / Ordering NPWT, Apligraf, etc. []  - 0 Emergency Hospital Admission (emergent condition) PROCESS - Special Needs []  - Pediatric / Minor Patient Management 0 []  - 0 Isolation Patient Management []  - 0 Hearing  / Language / Visual special needs []  - 0 Assessment of Community assistance (transportation, D/C planning, etc.) []  - 0 Additional assistance / Altered mentation []  - 0 Support Surface(s) Assessment (bed, cushion, seat, etc.) INTERVENTIONS - Miscellaneous []  - External ear exam 0 []  - 0 Patient Transfer (multiple staff /  Hoyer Lift / Similar devices) []  - 0 Simple Staple / Suture removal (25 or less) []  - 0 Complex Staple / Suture removal (26 or more) []  - 0 Hypo/Hyperglycemic Management (do not check if billed separately) []  - 0 Ankle / Brachial Index (ABI) - do not check if billed separately Has the patient been seen at the hospital within the last three years: Yes Total Score: 75 Level Of Care: New/Established - Level 2 KIANDRE, SPAGNOLO ([id number]) Electronic Signature(s) Signed: 12/04/2019 7:43:28 PM By: Gretta Cool, BSN, RN, CWS, Kim RN, BSN Entered By: Gretta Cool, BSN, RN, CWS, Kim on 12/03/2019 11:03:51 [name] ([id number]) -------------------------------------------------------------------------------- Encounter Discharge Information Details Patient Name: [name] Date of Service: 12/03/2019 9:15 AM Medical Record Number: [id number] Patient Account Number: [id number] Date of Birth/Sex: [birth year] (74 y.o. M) Treating RN: Cornell Barman Primary Care Brookelyn Gaynor: [name] Other Clinician: Referring Shanayah Kaffenberger: Referral, Self Treating Charell Faulk/Extender: Melburn Hake, HOYT Weeks in Treatment: 0 Encounter Discharge Information Items Discharge Condition: Stable Ambulatory Status: Wheelchair Discharge Destination: Home Transportation: Private Auto Accompanied By: self Schedule Follow-up Appointment: Yes Clinical Summary of Care: Electronic Signature(s) Signed: 12/04/2019 7:43:28 PM By: Gretta Cool, BSN, RN, CWS, Kim RN, BSN Entered By: Gretta Cool, BSN, RN, CWS, Kim on 12/03/2019 11:00:15 [name]  ([id number]) -------------------------------------------------------------------------------- Lower Extremity Assessment Details Patient Name: [name] Date of Service: 12/03/2019 9:15 AM Medical Record Number: [id number] Patient Account Number: [id number] Date of Birth/Sex: [birth year] (74 y.o. M) Treating RN: Cornell Barman Primary Care Henry Utsey: [name] Other Clinician: Referring Dezarai Prew: Referral, Self Treating Gayathri Futrell/Extender: [name], HOYT Weeks in Treatment: 0 Edema Assessment Assessed: [Left: Yes] [Right: No] Edema: [Left: N] [Right: o] Calf Left: Right: Point of Measurement: 33 cm From Medial Instep 35 cm cm Ankle Left: Right: Point of Measurement: 12 cm From Medial Instep 14 cm cm Vascular Assessment Pulses: Dorsalis Pedis Palpable: [Left:Yes] Posterior Tibial Palpable: [Left:Yes] Notes Right ABI 1.05 from hospital 11/24/19 Left ABI 1.01 from hospital 11/24/19 Electronic Signature(s) Signed: 12/04/2019 11:15:54 AM By: Darci Needle Signed: 12/04/2019 7:43:28 PM By: Gretta Cool, BSN, RN, CWS, Kim RN, BSN Entered By: Darci Needle on 12/03/2019 10:42:08 [name] ([id number]) -------------------------------------------------------------------------------- Multi Wound Chart Details Patient Name: [name] Date of Service: 12/03/2019 9:15 AM Medical Record Number: [id number] Patient Account Number: [id number] Date of Birth/Sex: [birth year] (74 y.o. M) Treating RN: Cornell Barman Primary Care Kesa Birky: [name] Other Clinician: Referring Larsen Dungan: Referral, Self Treating Daphnie Venturini/Extender: [name], HOYT Weeks in Treatment: 0 Vital Signs Height(in): 71 Pulse(bpm): 128 Weight(lbs): 190 Blood Pressure(mmHg): 138/66 Body Mass Index(BMI): 26 Temperature(F): 98.2 Respiratory Rate(breaths/min): 20 Photos: [N/A:N/A] Wound Location: Left, Lateral, Dorsal Foot Left, Distal Toe Great N/A Wounding Event: Surgical Injury Pressure Injury  N/A Primary Etiology: Diabetic Wound/Ulcer of the Lower Diabetic Wound/Ulcer of the Lower N/A Extremity Extremity Secondary Etiology: N/A Pressure Ulcer N/A Comorbid History: Cataracts, Middle ear problems, Cataracts, Middle ear problems, N/A Sleep Apnea, Hypertension, Type II Sleep Apnea, Hypertension, Type II Diabetes, Gout, Osteoarthritis, Diabetes, Gout, Osteoarthritis, Neuropathy, Received Neuropathy, Received Chemotherapy Chemotherapy Date Acquired: 11/23/2019 09/03/2019 N/A Weeks of Treatment: 0 0 N/A Wound Status: Open Open N/A Clustered Wound: No Yes N/A Measurements L x W x D (cm) 2.7x3x0.5 0.6x0.7x0.1 N/A Area (cm) : 6.362 0.33 N/A Volume (cm) : 3.181 0.033 N/A % Reduction in Area: 0.00% 0.00% N/A % Reduction in Volume: 0.00% 0.00% N/A Classification: Grade 3 Grade 1 N/A Exudate Amount: Medium N/A N/A Exudate Type: Serosanguineous N/A N/A Exudate Color: red, brown N/A N/A  Wound Margin: Flat and Intact N/A N/A Granulation Amount: Small (1-33%) N/A N/A Granulation Quality: Pink N/A N/A Necrotic Amount: Large (67-100%) N/A N/A Exposed Structures: Fat Layer (Subcutaneous Tissue): N/A N/A Yes Fascia: No Tendon: No Muscle: No Joint: No Bone: No Epithelialization: Small (1-33%) N/A N/A Treatment Notes Electronic Signature(s) Signed: 12/04/2019 7:43:28 PM By: Gretta Cool, BSN, RN, CWS, Kim RN, BSN [address], [place] EMarland Kitchen ([id number]) Entered By: Gretta Cool, BSN, RN, CWS, Kim on 12/03/2019 10:51:32 [name] ([id number]) -------------------------------------------------------------------------------- Multi-Disciplinary Care Plan Details Patient Name: [name] Date of Service: 12/03/2019 9:15 AM Medical Record Number: [id number] Patient Account Number: [id number] Date of Birth/Sex: 1945/06/02 (74 y.o. M) Treating RN: Cornell Barman Primary Care Usiel Astarita: [name] Other Clinician: Referring Aydon Swamy: Referral, Self Treating Anselma Herbel/Extender: Melburn Hake, HOYT Weeks in  Treatment: 0 Active Inactive Necrotic Tissue Nursing Diagnoses: Impaired tissue integrity related to necrotic/devitalized tissue Goals: Necrotic/devitalized tissue will be minimized in the wound bed Date Initiated: 12/03/2019 Target Resolution Date: 12/07/2019 Goal Status: Active Interventions: Assess patient pain level pre-, during and post procedure and prior to discharge Treatment Activities: Apply topical anesthetic as ordered : 12/03/2019 Notes: Orientation to the Wound Care Program Nursing Diagnoses: Knowledge deficit related to the wound healing center program Goals: Patient/caregiver will verbalize understanding of the [place] Date Initiated: 12/03/2019 Target Resolution Date: 12/07/2019 Goal Status: Active Interventions: Provide education on orientation to the wound center Notes: Pain, Acute or Chronic Nursing Diagnoses: Pain Management - Cyclic Acute (Dressing Change Related) Goals: Patient will verbalize adequate pain control and receive pain control interventions during procedures as needed Date Initiated: 12/03/2019 Target Resolution Date: 12/07/2019 Goal Status: Active Interventions: Assess comfort goal upon admission Treatment Activities: Administer pain control measures as ordered : 12/03/2019 Notes: Wound/Skin Impairment Nursing Diagnoses: Impaired tissue integrity AHMAN, DUGDALE ([id number]) Goals: Ulcer/skin breakdown will have a volume reduction of 30% by week 4 Date Initiated: 12/03/2019 Target Resolution Date: 01/03/2020 Goal Status: Active Ulcer/skin breakdown will have a volume reduction of 50% by week 8 Date Initiated: 12/03/2019 Target Resolution Date: 02/02/2020 Goal Status: Active Ulcer/skin breakdown will have a volume reduction of 80% by week 12 Date Initiated: 12/03/2019 Target Resolution Date: 03/04/2020 Goal Status: Active Interventions: Assess ulceration(s) every visit Treatment Activities: Referred to DME  Latish Toutant for dressing supplies : 12/03/2019 Topical wound management initiated : 12/03/2019 Notes: Electronic Signature(s) Signed: 12/04/2019 7:43:28 PM By: Gretta Cool, BSN, RN, CWS, Kim RN, BSN Entered By: Gretta Cool, BSN, RN, CWS, Kim on 12/03/2019 10:51:13 [name] ([id number]) -------------------------------------------------------------------------------- Negative Pressure Wound Therapy Application (NPWT) Details Patient Name: [name] Date of Service: 12/03/2019 9:15 AM Medical Record Number: [id number] Patient Account Number: [id number] Date of Birth/Sex: 04-09-1946 (74 y.o. M) Treating RN: Cornell Barman Primary Care Mylisa Brunson: [name] Other Clinician: Referring Bernerd Terhune: Referral, Self Treating Trypp Heckmann/Extender: [name], HOYT Weeks in Treatment: 0 NPWT Application Performed for: Wound #7 Left, Lateral, Dorsal Foot Additional Injuries Covered: No Performed By: Cornell Barman, RN Type: Other Coverage Size (sq cm): 8.1 Pressure Type: Constant Pressure Setting: 125 mmHG Drain Type: None Primary Contact: Other Other: Prisma Ag Quantity of Sponges/Gauze Inserted: 2 Sponge/Dressing Type: Foam, Black Date Initiated: 11/30/2019 Response to Treatment: tolerated well Post Procedure Diagnosis Same as Pre-procedure Electronic Signature(s) Signed: 12/04/2019 7:43:28 PM By: Gretta Cool, BSN, RN, CWS, Kim RN, BSN Entered By: Gretta Cool, BSN, RN, CWS, Kim on 12/03/2019 10:52:57 [name] ([id number]) -------------------------------------------------------------------------------- Pain Assessment Details Patient Name: [name] Date of Service: 12/03/2019 9:15 AM Medical Record Number: [id number]  Patient Account Number: [id number] Date of Birth/Sex: 13-Jul-1945 (74 y.o. M) Treating RN: Cornell Barman Primary Care Antonino Nienhuis: [name] Other Clinician: Referring Jaekwon Mcclune: Referral, Self Treating Lavel Rieman/Extender: [name], HOYT Weeks in Treatment: 0 Active  Problems Location of Pain Severity and Description of Pain Patient Has Paino No Site Locations With Dressing Change: No Rate the pain. Current Pain Level: 0 Pain Management and Medication Current Pain Management: Electronic Signature(s) Signed: 12/04/2019 11:15:54 AM By: Darci Needle Signed: 12/04/2019 7:43:28 PM By: Gretta Cool, BSN, RN, CWS, Kim RN, BSN Entered By: Darci Needle on 12/03/2019 09:53:55 [name] ([id number]) -------------------------------------------------------------------------------- Patient/Caregiver Education Details Patient Name: [name] Date of Service: 12/03/2019 9:15 AM Medical Record Number: [id number] Patient Account Number: [id number] Date of Birth/Gender: [birth year] (74 y.o. M) Treating RN: Cornell Barman Primary Care Physician: [name] Other Clinician: Referring Physician: Referral, Self Treating Physician/Extender: Melburn Hake, HOYT Weeks in Treatment: 0 Education Assessment Education Provided To: Patient Education Topics Provided Wound Debridement: Handouts: Wound Debridement Methods: Demonstration, Explain/Verbal Responses: Refused information Wound/Skin Impairment: Handouts: Caring for Your Ulcer Methods: Demonstration, Explain/Verbal Responses: State content correctly Electronic Signature(s) Signed: 12/04/2019 7:43:28 PM By: Gretta Cool, BSN, RN, CWS, Kim RN, BSN Entered By: Gretta Cool, BSN, RN, CWS, Kim on 12/03/2019 10:58:46 [name] ([id number]) -------------------------------------------------------------------------------- Wound Assessment Details Patient Name: [name] Date of Service: 12/03/2019 9:15 AM Medical Record Number: [id number] Patient Account Number: [id number] Date of Birth/Sex: 12-Dec-1945 (74 y.o. M) Treating RN: Cornell Barman Primary Care Vallerie Hentz: [name] Other Clinician: Referring Amadou Katzenstein: Referral, Self Treating Jahbari Repinski/Extender: [name], HOYT Weeks in Treatment: 0 Wound  Status Wound Number: 7 Primary Diabetic Wound/Ulcer of the Lower Extremity Etiology: Wound Location: Left, Lateral, Dorsal Foot Wound Open Wounding Event: Surgical Injury Status: Date Acquired: 11/23/2019 Comorbid Cataracts, Middle ear problems, Sleep Apnea, Weeks Of Treatment: 0 History: Hypertension, Type II Diabetes, Gout, Osteoarthritis, Clustered Wound: No Neuropathy, Received Chemotherapy Photos Wound Measurements Length: (cm) 2.7 % R Width: (cm) 3 % R Depth: (cm) 0.5 Epi Area: (cm) 6.362 Tu Volume: (cm) 3.181 Un eduction in Area: 0% eduction in Volume: 0% thelialization: Small (1-33%) nneling: No dermining: Yes Starting Position (o'clock): 9 Ending Position (o'clock): 11 Maximum Distance: (cm) 1.7 Wound Description Classification: Grade 3 Fou Wound Margin: Flat and Intact Slo Exudate Amount: Medium Exudate Type: Serosanguineous Exudate Color: red, brown l Odor After Cleansing: No ugh/Fibrino Yes Wound Bed Granulation Amount: Small (1-33%) Exposed Structure Granulation Quality: Pink Fascia Exposed: No Necrotic Amount: Large (67-100%) Fat Layer (Subcutaneous Tissue) Exposed: Yes Necrotic Quality: Adherent Slough Tendon Exposed: No Muscle Exposed: No Joint Exposed: No Bone Exposed: No Treatment Notes Wound #7 (Left, Lateral, Dorsal Foot) 1. Cleansed with: WISAM, SIEFRING ([id number]) Clean wound with Normal Saline 4. Dressing Applied: Prisma Ag 8. Negative Pressure Wound Therapy Wound Vac to wound continuously at 166mm/hg pressure Black Foam Change canister as needed. Electronic Signature(s) Signed: 12/04/2019 7:43:28 PM By: Gretta Cool, BSN, RN, CWS, Kim RN, BSN Previous Signature: 12/03/2019 10:30:29 AM Version By: Gretta Cool, BSN, RN, CWS, Kim RN, BSN Entered By: Gretta Cool, BSN, RN, CWS, Kim on 12/03/2019 10:58:02 [name] ([id number]) -------------------------------------------------------------------------------- Wound Assessment Details Patient  Name: [name] Date of Service: 12/03/2019 9:15 AM Medical Record Number: [id number] Patient Account Number: [id number] Date of Birth/Sex: [birth year] (74 y.o. M) Treating RN: Cornell Barman Primary Care Sylvanna Burggraf: [name] Other Clinician: Referring Inri Sobieski: Referral, Self Treating Brigette Hopfer/Extender: [name], HOYT Weeks in Treatment: 0 Wound Status Wound Number: 8 Primary Diabetic Wound/Ulcer of the Lower Extremity  Etiology: Wound Location: Left, Distal Toe Great Secondary Pressure Ulcer Wounding Event: Pressure Injury Etiology: Date Acquired: 09/03/2019 Wound Open Weeks Of Treatment: 0 Status: Clustered Wound: Yes Comorbid Cataracts, Middle ear problems, Sleep Apnea, History: Hypertension, Type II Diabetes, Gout, Osteoarthritis, Neuropathy, Received Chemotherapy Photos Wound Measurements Length: (cm) 0.6 Width: (cm) 0.7 Depth: (cm) 0.1 Area: (cm) 0.33 Volume: (cm) 0.033 % Reduction in Area: 0% % Reduction in Volume: 0% Wound Description Classification: Grade 1 Treatment Notes Wound #8 (Left, Distal Toe Great) 1. Cleansed with: Clean wound with Normal Saline 4. Dressing Applied: Prisma Ag 8. Negative Pressure Wound Therapy Wound Vac to wound continuously at 198mm/hg pressure Black Foam Change canister as needed. Electronic Signature(s) Signed: 12/04/2019 11:15:54 AM By: Darci Needle Signed: 12/04/2019 7:43:28 PM By: Gretta Cool BSN, RN, CWS, Kim RN, BSN Entered By: Darci Needle on 12/03/2019 10:36:18 [name] ([id number]) -------------------------------------------------------------------------------- [place] Details Patient Name: [name] Date of Service: 12/03/2019 9:15 AM Medical Record Number: [id number] Patient Account Number: [id number] Date of Birth/Sex: 1946/02/23 (74 y.o. M) Treating RN: Cornell Barman Primary Care Nikolaus Pienta: [name] Other Clinician: Referring Alexandro Line: Referral, Self Treating Tanny Harnack/Extender: STONE  III, HOYT Weeks in Treatment: 0 Vital Signs Time Taken: 09:35 Temperature (F): 98.2 Height (in): 71 Pulse (bpm): 128 Source: Stated Respiratory Rate (breaths/min): 20 Weight (lbs): 190 Blood Pressure (mmHg): 138/66 Source: Stated Reference Range: 80 - 120 mg / dl Body Mass Index (BMI): 26.5 Electronic Signature(s) Signed: 12/04/2019 11:15:54 AM By: Darci Needle Entered By: Darci Needle on 12/03/2019 09:54:46

## 2019-12-05 NOTE — Progress Notes (Signed)
OSRIC, KLOPF ([id number]) Visit Report for 12/03/2019 Chief Complaint Document Details Patient Name: [name], [name] Date of Service: 12/03/2019 9:15 AM Medical Record Number: [id number] Patient Account Number: [id number] Date of Birth/Sex: [birth year] (74 y.o. M) Treating RN: Cornell Barman Primary Care Provider: [name] Other Clinician: Referring Provider: Referral, Self Treating Provider/Extender: [name], Tauna Macfarlane Weeks in Treatment: 0 Information Obtained from: Patient Chief Complaint Left plantar foot ulcer s/p surgical debridement and wound VAC placement. Left great toe ulcer. Electronic Signature(s) Signed: 12/03/2019 10:48:01 AM By: Worthy Keeler [name] Previous Signature: 12/03/2019 10:33:14 AM Version By: Worthy Keeler [name] Entered By: Worthy Keeler on 12/03/2019 10:48:01 [name] ([id number]) -------------------------------------------------------------------------------- Debridement Details Patient Name: [name] Date of Service: 12/03/2019 9:15 AM Medical Record Number: [id number] Patient Account Number: [id number] Date of Birth/Sex: 01/21/1946 (74 y.o. M) Treating RN: Cornell Barman Primary Care Provider: [name] Other Clinician: Referring Provider: Referral, Self Treating Provider/Extender: [name], Dejan Angert Weeks in Treatment: 0 Debridement Performed for Wound #7 Left,Lateral,Dorsal Foot Assessment: Performed By: Physician [name], Jamaree Hosier E., [name] Debridement Type: Debridement Severity of Tissue Pre Debridement: Fat layer exposed Level of Consciousness (Pre- Awake and Alert procedure): Pre-procedure Verification/Time Out Yes - 10:50 Taken: Total Area Debrided (L x W): 2.7 (cm) x 3 (cm) = 8.1 (cm) Tissue and other material Viable, Non-Viable, Slough, Subcutaneous, Slough debrided: Level: Skin/Subcutaneous Tissue Debridement Description: Excisional Instrument: Curette Bleeding: Minimum Hemostasis Achieved: Pressure Response to  Treatment: Procedure was tolerated well Level of Consciousness (Post- Awake and Alert procedure): Post Debridement Measurements of Total Wound Length: (cm) 2.7 Width: (cm) 3 Depth: (cm) 0.5 Volume: (cm) 3.181 Character of Wound/Ulcer Post Debridement: Stable Severity of Tissue Post Debridement: Fat layer exposed Post Procedure Diagnosis Same as Pre-procedure Electronic Signature(s) Signed: 12/03/2019 4:25:47 PM By: Worthy Keeler [name] Signed: 12/04/2019 7:43:28 PM By: Gretta Cool, BSN, RN, CWS, Kim RN, BSN Entered By: Gretta Cool, BSN, RN, CWS, Kim on 12/03/2019 11:05:14 [name] ([id number]) -------------------------------------------------------------------------------- HPI Details Patient Name: [name] Date of Service: 12/03/2019 9:15 AM Medical Record Number: [id number] Patient Account Number: [id number] Date of Birth/Sex: 1945/04/26 (74 y.o. M) Treating RN: Cornell Barman Primary Care Provider: [name] Other Clinician: Referring Provider: Referral, Self Treating Provider/Extender: Melburn Hake, Mclain Freer Weeks in Treatment: 0 History of Present Illness HPI Description: 10/04/16 on evaluation today patient presents for initial evaluation concerning a laceration of the right wrist and hand on the bowler aspect. He tells me that he fell into glass following a medication change in regard to his diabetes. He initially went to [facility] where she was sutured and subsequently his daughter who is a Marine scientist removed the teachers at the appropriate time. Unfortunately the wound has done poorly and dehissed which has caused him some trouble including infection. He has been on antibiotics both topically and orally though the wound continues to show signs of necrotic tissue according to notes which is what he was sent to [place] for evaluation concerning. He does have diabetes and unfortunately this is severely uncontrolled. He does see an endocrinologist and is working on this. 10/11/2016 --  the patient's notes from the ER were reviewed and he had his suturing done on 09/17/2016 and had necrotic skin flaps which had to be trimmed the last visit he was here. He has managed to get his Santyl ointment and has been using this appropriately. 10/21/16 patient's wound appears to be doing very well on evaluation today. He still has some Psychologist, sport and exercise  the wound bed but this is nowhere near as severe as when i saw this initially two weeks ago nor even my review of his pictures last week. I'm pleased with how this is progressing. Patient's wound over the right form appears to be doing fairly well on evaluation today. There is no evidence of infection and he has a small area at the crease of where she is wrist extends and flexes that is still open and appears to be having difficulty due to the fact that it is cracking open. I think this is something that may be controlled with moisture control that is keeping the wound a little bit more moist. Nonetheless this is a tiny region compared to the initial wound and overall appears to be doing very well. No fevers, chills, nausea, or vomiting noted at this time. 11/25/16 on evaluation today patient appears to be doing well in regard to his right wrist ulcer. In fact this appears to be completely healed he is having no discomfort. He is pleased with how this is progressed. Readmission: 06/24/17 on evaluation today patient appears back in our clinic regarding issues that he has been having with his right ankle on the lateral malleolus as well is in the dorsal surface of his left foot. Both have been going on for several weeks unfortunately. He tells me that initially this was being managed by Dr. Edilia Bo and I did review the notes from Dr. Edilia Bo in epic. Patient was placed on amoxicillin initially and then subsequently Keflex following. He has been using triple antibiotic ointment over-the-counter along with a Band-Aid for the wound currently. He did not  have any Santyl remaining. His most recent hemoglobin A1c which was just two days ago was 10.3. Fortunately he seems to have excellent blood flow in the bilateral lower extremities with his left ABI being 0.95 in the right ABI 1.01. Patient does have some pain in regard to each of these ulcerations but he tells me it's not nearly as severe as the hand wound that I help treat earlier over the summer. This was in 2018. 07/15/17 on evaluation today patient's lower surety ulcer is actually both appear to be doing fairly well at this point. He does have a little bit of skin overlapping the ulcer on the right lateral ankle this was debrided away today just with saline and gauze without complication patient had no discomfort or pain. Otherwise patient's left foot ulcer does appear to be showing signs of improvement he does seem to have a right great toe. Nikki and noted at this point in time. I think that this does need to be managed at this point. No fevers, chills, nausea, or vomiting noted at this time. 07/22/17 on evaluation today patient's wounds in regard to his lower extremities appear to be doing somewhat better. His right ankle wound in fact appears to likely be completely closed. The left dorsal foot ulcer though still open does appear to be a little bit smaller and does also seem to be making good progress. He has not been having problems with the treatment at this point in the general he does not seem to show as much erythema surrounding the wound bed which is good news. He is not having significant discomfort. 08/12/17 on evaluation today patients wounds of both appeared to be doing better in fact they both appear to be completely healed. Overall I'm very pleased with how things have progressed over the past week even more than what I expected. Obviously  this is good news and he is happy about this. He did have a fall when going to his orthopedic doctor recently fortunately he does not appear to have  injured anything severely but nonetheless does have a couple of bumps and bruises nothing significant at this time but he needs to be seen and wound care for. He may have broken his left wrist he is following up with orthopedics in that regard. Readmission: 10/24/17 on evaluation today patient presents for reevaluation after having been discharged Aug 12, 2017. His wound was healed at that point although he states that it has been somewhat discolored since that time. He has not experienced the drainage as far as the wound is concerned but the fact that it was still discolored been greater than two months after the fact made him want to come in for reevaluation. Upon inspection today the patient has no pain although he does have neuropathy. He does have a discolored region of the dorsal surface of the left foot which was the site where the ulcer was that we previously treated. The good news is he does not again have any discomfort although obviously I do believe this may be some scar tissue and just discoloration in that regard. Again he hasn't had any drainage or discharge since I last saw him and at this point I see no evidence of that either this area shows complete epithelialization. In general I do not feel like there's any significant issue at this point in that regard. 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 He has been in this clinic before in 2018 with a laceration on his right hand and then again in 2019 with an area over the right lateral malleolus. 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 He states that the wound is been there for the last week or DEMARRIO, MENGES. ([id number]) 2. It is been uncomfortable to walk on. The man is an active man having to look after an elderly mother. Past medical history, type 2 diabetes  with polyneuropathy with a recent hemoglobin A1c in September 2011 0.3, lumbar disc disease, hypergonadism., Left foot drop and gout. We did not repeat his ABIs in the clinic quoting values from July 2019 of 0.95 and 1.01. He is not felt to have significant PAD 02/19/2019 on evaluation today patient actually appears to be doing better with regard to his foot wound based on what I am seeing currently. He was seen by Dr. Dellia Nims on readmission back into the clinic last week when I was off. The good news is the patient has been tolerating the dressing changes without any complication in fact has not really had much in the way of drainage. He does have an AFO brace which she believes is actually pushing on this area as well and has contributed to the issue. Fortunately there is no signs of active infection at this point. His ABIs appear to be well. 03/05/2019 on evaluation today patient appears to be doing well in regard to his foot ulcer. He has been tolerating the dressing changes without complication. Fortunately there is no signs of active infection. The wound is measuring somewhat smaller and overall I feel like he is making good progress at this point. 03/12/2019 on evaluation today patient actually appears to be doing quite well with regard to his foot ulcer.  The wound is looking better the measurement is not significantly better at this point unfortunately but nonetheless again the overall appearance is improving I think he is making some progress. I still think the big issue here is foot drop and again the patient wants to see if potentially he could get a brace to help with this. Subsequently I think that we can definitely make a referral to [facility] to see if they can help [place] in this regard. He is in agreement with that plan. 03/26/2019 on evaluation today patient appears to be doing more poorly in regard to his foot ulcer at this time. He did see podiatry in order to see about getting a brace  but unfortunately he tells me that Medicare is not likely to pay for one for him as he had 1 I guess 8 months ago. However this one that apparently goes in his shoes that does not seem to be working out well for him. Unfortunately there are signs of active infection at this time. No fevers, chills, nausea, vomiting, or diarrhea. Systemically. The patient has erythema on the dorsal surface of his foot that tracks from the lateral portion of his foot and again he does have a blister around the wound as well which is worse compared to previous. Overall I am concerned. 04/02/2019 on evaluation today patient appears to be doing about the same with regard to his wound. Unfortunately there is no signs of significant improvement although I do think there is no signs of infection which is good news. His drainage is also slowed down tremendously. I think at this point he would benefit from the initiation of a total contact cast. The only concern I have is that over his balance. I explained to the patient that if we were to initiate the cast I think it would be greatly beneficial for him but he would need to ensure not to walk at all unless he uses a walker he actually has 2 walkers one for inside his home and one that he keeps out in the car. For that reason he would be covered in that regard. I just do not want him to have any accidents and fall with the cast. 12/28-Patient comes in 1 week out a day early for his appointment he was in the hospital for 2 days for uncontrolled hyperglycemia and required management of high blood sugars, with some adjustments in his home regimen of insulin, patient stubbed his right great toe today and put a Band-Aid on it, he is here for his left lateral foot ulcer and we are using silver alginate dressing, he was intact total contact cast with difficulty with balance to the extent that that had to be discontinued 04/16/2019 on evaluation today patient appears to be doing better  with regard to the overall appearance of his wound size. This is good news. We had attempted a total contact cast but unfortunately he had some falling issues that the family contacted [place] about we took the cast off we did not put it back on. Nonetheless it ended up that just a couple days later he was in the hospital due to elevations in his blood sugar which went to around 500 and even a little higher. Subsequently he was admitted to the hospital and was in the hospital for a total of 3 days and that included Christmas day. Fortunately there got his blood sugar under control he tells me that this is running about 200 now which is much  better news. There does not appear to be any signs of active infection at this time. No fevers, chills, nausea, vomiting, or diarrhea. 04/30/2019 on evaluation today patient actually appears to be doing slightly worse compared to previous as far as the overall size of his wound is concerned. There also is some need for sharp debridement today as well. Fortunately there is no evidence of active infection which is good news. No fevers, chills, nausea, vomiting, or diarrhea. 05/07/2019 upon evaluation today patient appears to be doing excellent in regard to his plantar foot ulcer. He has been tolerating the dressing changes without complication. Fortunately there is no signs of active infection at this time. No fevers, chills, nausea, vomiting, or diarrhea. He does tell me that he actually got in touch with [employer] and they feel like they can add diagnosis codes to his orders in order to get an external brace for him as opposed to the one that goes internal to issue. That would be excellent for his foot drop and I did tell him that if there is anything I need to fill out in that regard I will be more than happy to oblige and fill out what is needed for [employer]. 05/14/2019 upon evaluation today patient's original wound site actually appears to be doing much better which is good  news. Fortunately there is no signs of active infection at this time. No fevers, chills, nausea, vomiting, or diarrhea. With that being said the patient unfortunately is continuing to have issues with rubbing he tells me that the felt slid on his foot although he did not realize it until today. Nonetheless I think this did cause a blister around the edge of the wound and he does an open wound that is slightly larger overall in measurement compared to last time mainly due to this blister. Other than that I feel like he is doing quite well. 05/21/2019 upon evaluation today patient appears to be doing excellent in regard to his wound at this time on the plantar foot. He is showing signs of improvement he does have some slough and biofilm noted on the surface of the wound this is going require some sharp debridement today as well. Fortunately there is no evidence of active infection. No fevers, chills, nausea, vomiting, or diarrhea. 05/28/2019 on evaluation today patient's wound actually appears to be measuring smaller upon initial inspection and I do believe that it is doing well. With that being said he continues to have a blister on the outside/lateral edge of the wound that occurs as a result of rubbing due to his dropfoot currently. Subsequently we have filled out the paperwork for his new external brace but again that does take several weeks to get that completed once everything is returned to back we did send this back last week as far as the paperwork was concerned. With that being said there does not appear to be any signs of active infection at this time. 06/04/2019 on evaluation today patient's wound actually appears to be doing slightly better compared to last week which is good news. He still states that his insurance unfortunately does not seem to want to cover any of his new brace. He is going to just pay for this himself. Nonetheless he is going by there today when he leaves here that is  [employer] in [place] that is going to be making this brace for him. 06/18/2019 upon evaluation today patient fortunately does have his new brace which is the external AFO brace with  bilateral uprights. Subsequently he seems to be doing much better in fact the wound already appears better. He has not even been using the felt he is just using a dressing over Potter ([id number]) top of the wound and overall I am very pleased with how things seem to be progressing. There is no signs of active infection at this time which is great news and I think that the AFO is good to help him tremendously as far as trying to keep pressure off of the 06/25/2019 upon evaluation today patient seems to be making good progress in regard to his wound. He has been tolerating the dressing changes without complication. Fortunately there is no signs of active infection at this time. No fevers, chills, nausea, vomiting, or diarrhea. 07/02/2019 upon evaluation today patient unfortunately had a blistered area on the bottom of his foot tracking more towards the midline of the plantar aspect. He does not seem to have issues with rubbing more laterally that he used to have but nonetheless this is still giving him some trouble and causing problems here. Fortunately there is no signs of active infection at this time. No fevers, chills, nausea, vomiting, or diarrhea. 07/09/2019 upon evaluation today patient appears to be doing well with regard to his plantar foot ulcer. Definitely this is much improved compared to last week's visit which again last week the issue there was that he had an area of callus buildup on the plantar aspect of his foot I was able to clean this away and after effectively doing so the wound seems to be doing much better which is great news. There is no signs of active infection at this time. 07/16/2019 upon evaluation today patient appears to be making some progress in my opinion with regard to his plantar foot  ulcer. He has been tolerating the dressing changes without complication. Fortunately there is no evidence of active infection at this time which is great news. 07/23/2019 upon evaluation today patient appears to be doing well with regard to his wounds on the plantar foot. He has been tolerating the dressing changes without complication. These do seem to be healing. 08/06/2019 upon evaluation today patient's wound actually appears to be showing some signs of improvement though he still has a significant amount of callus buildup over this location. That is can require some debridement today. With that being said the actual wound openings seem to be dramatically improved which is great news. There is no signs of active infection at this time. No fevers, chills, nausea, vomiting, or diarrhea. 08/13/2019 upon evaluation today patient appears to be doing well with regard to his foot ulcer. He has been tolerating the dressing changes without complication. Fortunately there is no signs of active infection at this time. No fevers, chills, nausea, vomiting, or diarrhea. 08/20/2019 upon evaluation today patient appears to be doing excellent in regard to his foot ulcer. I do believe he still showing signs of improvement this is very slow but again I think still pressure getting to the area even with his brace and custom shoe is still part of the main issue with the delay here. Nonetheless I do not see any signs of worsening or infection all of which is good news. 5/20; left lateral foot wound. He wears an AFO brace for foot drop related to his diabetes. The wound is small and clean looking he is using silver collagen. We have apparently tried him in offloading shoe he could not tolerate this. He does not have  good balance. 09/11/2019 upon evaluation today patient appears to be doing more poorly in regard to his foot ulcer as compared to last time I saw him. Unfortunately he does tell me as we discussed on Friday that he  had pus and blood coming from the area underneath the callus. Unfortunately I think that this is a combination of the callus not being trimmed away enough and potentially the felt pad causing an abnormal rubbing which has happened before which led to this becoming more irritated and likely infected based on what I am seeing today. The patient takes ampicillin all the time. Subsequently he also states that he has been trying for a couple of days to take the clindamycin of which she had some leftover from previous. With that being said it upset his stomach. The antibiotic that I sent in for him on Friday unfortunately I wrote the wrong number on as far as the number of pills that he needed. With that being said I corrected that this morning but he tells me is not can pick it up as that seems to be making him have trouble with his stomach being upset at this point. Fortunately there is no signs of active infection at this time systemically. 09/17/2019 upon evaluation today patient appears to be doing better in regard to his foot ulcer. Has been taking ampicillin that he had leftover from a previous prescription. With that being said he did go see his doctor today concerning his diabetes and notes that his hemoglobin A1c was 13 obviously this is elevated and there can be working on that. Also did review the culture result that we finally got back for final well that showed rare Pseudomonas and a few group B strep noted. The Pseudomonas was sensitive to Cipro but again the wound is doing so much better with the ampicillin I think this is probably not a causative organism I do not see anything that really has me concerned as far as infection is concerned today. Therefore I would hold off on prescribing Cipro for the patient and have him discontinue the ampicillin until completion. 09/24/2019 upon evaluation today patient appears to be doing 09/24/2019 upon evaluation today patient appears to be doing well with  regard to his foot ulcer. He actually has not enlarged any and seems to be measuring smaller quite significantly at this point. His toe ulcer is also doing somewhat better which is good news. Fortunately there is no evidence of active infection at this time. No fevers, chills, nausea, vomiting, or diarrhea. 10/08/2019 upon evaluation today patient appears to be doing well with regard to his foot ulcer all things considered. Fortunately there is no signs of active infection at this time. No fevers, chills, nausea, vomiting, or diarrhea. Fortunately he states that overall he has been doing much better with regard to not have any blistering on the side of his foot. Unfortunately he did have a fall last night due to the fact that he got up to walk without his brace on and did not have his walker either it was around 4 in the morning and his mother was calling out. Subsequently he states that the foot drop got him and he ended up following. Nonetheless he states he normally uses his walker I explained he needs to try to make sure not to ever go without the walker. No matter how attempting the situation may be. Readmission: 12/03/2019 upon evaluation today patient presents for follow-up after having had surgery last week on  his foot for surgical debridement and then subsequent wound VAC placement. He had gone into the hospital as he was having a significant infection and not feeling well. He is unfortunately since I last saw him also been under the care of the cancer doctors and they have been taking good care of him. He does seem to be in fairly high spirits all things considered. With that being said he has lung cancer he tells me that his back from the radiation feels like it is constantly burned. Electronic Signature(s) Signed: 12/03/2019 3:39:38 PM By: Worthy Keeler [name] Entered By: Worthy Keeler on 12/03/2019 15:39:38 [name]  ([id number]) -------------------------------------------------------------------------------- Physical Exam Details Patient Name: [name] Date of Service: 12/03/2019 9:15 AM Medical Record Number: [id number] Patient Account Number: [id number] Date of Birth/Sex: [birth year] (74 y.o. M) Treating RN: Cornell Barman Primary Care Provider: [name] Other Clinician: Referring Provider: Referral, Self Treating Provider/Extender: [name], Maleigh Bagot Weeks in Treatment: 0 Constitutional sitting or standing blood pressure is within target range for patient.. pulse regular and within target range for patient.[name] Kitchen respirations regular, non- labored and within target range for patient.[name] Kitchen temperature within target range for patient.. Well-nourished and well-hydrated in no acute distress. Eyes conjunctiva clear no eyelid edema noted. pupils equal round and reactive to light and accommodation. Ears, Nose, Mouth, and Throat no gross abnormality of ear auricles or external auditory canals. normal hearing noted during conversation. mucus membranes moist. Respiratory normal breathing without difficulty. Cardiovascular 2+ dorsalis pedis/posterior tibialis pulses. no clubbing, cyanosis, significant edema, <3 sec cap refill. Musculoskeletal Patient unable to walk without assistance. no significant deformity or arthritic changes, no loss or range of motion, no clubbing. Psychiatric this patient is able to make decisions and demonstrates good insight into disease process. Alert and Oriented x 3. pleasant and cooperative. Notes Upon inspection patient's wound actually showed signs of fairly good granulation in my opinion he did have some necrotic tissue which is can require some debridement to clear away. Nonetheless I feel like the wound VAC is still probably the best way to go at this time which is great news. Fortunately there is no signs of systemic or even local infection at this point which is also good  news. In general I am pleased with the overall plan and progress for the patient. As far as offloading he was not able to use the knee scooter he just been using a wheelchair which I think is appropriate as well. He does need to be offloading as Dr. Rolena Infante told him as well. That is the surgeon who performed the surgery when he was in the hospital. Electronic Signature(s) Signed: 12/03/2019 3:40:59 PM By: Worthy Keeler [name] Entered By: Worthy Keeler on 12/03/2019 15:40:59 [name] ([id number]) -------------------------------------------------------------------------------- Physician Orders Details Patient Name: [name] Date of Service: 12/03/2019 9:15 AM Medical Record Number: [id number] Patient Account Number: [id number] Date of Birth/Sex: 1945/07/21 (74 y.o. 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 Wound #8 Left,Distal Toe Great o Clean wound with Normal Saline. Anesthetic (add  to Medication List) Wound #7 Left,Lateral,Dorsal Foot o Topical Lidocaine 4% cream applied to wound bed prior to debridement (In Clinic Only). Wound #8 Left,Distal Toe Great o Topical Lidocaine 4% cream applied to wound bed prior to debridement (In Clinic Only). Skin Barriers/Peri-Wound Care Wound #7 Left,Lateral,Dorsal Foot o Skin Prep Wound #8 Left,Distal Toe Great o Skin Prep Primary Wound Dressing Wound #7 Left,Lateral,Dorsal Foot o Silver  Collagen Wound #8 Left,Distal Toe Great o Other: - Betadine Secondary Dressing Wound #8 Left,Distal Toe Great o Other - coverlet Dressing Change Frequency Wound #7 Left,Lateral,Dorsal Foot o Change Dressing Monday, Wednesday, Friday Wound #8 Left,Distal Toe Great o Change Dressing Monday, Wednesday, Friday Follow-up Appointments Wound #7 Left,Lateral,Dorsal Foot o Return Appointment in 1 week. o Nurse Visit as needed Wound #8 [address], [place] E. ([id number]) o Return Appointment in 1 week. o Nurse Visit as needed Edema Control Wound #7 Left,Lateral,Dorsal Foot o Elevate legs to the level of the heart and pump ankles as often as possible Off-Loading Wound #7 Left,Lateral,Dorsal Foot o Other: - No weight on foot, Wheelchair only Additional Orders / Instructions Wound #7 Left,Lateral,Dorsal Foot o Increase protein intake. Wound #8 Left,Distal Toe Great o Increase protein intake. Home Health Wound #7 [place] Visits - [place] Nurse may visit PRN to address patientos wound care needs. o FACE TO FACE ENCOUNTER: MEDICARE and MEDICAID PATIENTS: I certify that this patient is under my care and that I had a face-to-face encounter that meets the physician face-to-face encounter requirements with this patient on this date. The encounter with the patient was in whole or in part for the following MEDICAL CONDITION: (primary reason for [place]) MEDICAL NECESSITY: I certify, that based on my findings, NURSING services are a medically necessary home health service. HOME BOUND STATUS: I certify that my clinical findings support that this patient is homebound (i.e., Due to illness or injury, pt requires aid of supportive devices such as crutches, cane, wheelchairs, walkers, the use of special transportation or the assistance of another person to leave their place of residence. There is  a normal inability to leave the home and doing so requires considerable and taxing effort. Other absences are for medical reasons / religious services and are infrequent or of short duration when for other reasons). o If current dressing causes regression in wound condition, may D/C ordered dressing product/s and apply Normal Saline Moist Dressing daily until next [place] / Other MD appointment. [place] of regression in wound condition at [phone]. o Please direct any NON-WOUND related issues/requests for orders to patient's Primary Care Physician Wound #8 Left,Distal Toe [place] Visits - [place] Nurse may visit PRN to address patientos wound care needs. o FACE TO FACE ENCOUNTER: MEDICARE and MEDICAID PATIENTS: I certify that this patient is under my care and that I had a face-to-face encounter that meets the physician face-to-face encounter requirements with this patient on this date. The encounter with the patient was in whole or in part for the following MEDICAL CONDITION: (primary reason for [place]) MEDICAL NECESSITY: I certify, that based on my findings, NURSING services are a medically necessary home health service. HOME BOUND STATUS: I certify that my clinical findings support that this patient is homebound (i.e., Due to illness or injury, pt requires aid of supportive devices such as crutches, cane, wheelchairs, walkers, the use of special transportation or the assistance of another  person to leave their place of residence. There is a normal inability to leave the home and doing so requires considerable and taxing effort. Other absences are for medical reasons / religious services and are infrequent or of short duration when for other reasons). o If current dressing causes regression in wound condition, may D/C ordered dressing product/s and apply Normal Saline Moist Dressing daily until next  [employer] / Other MD appointment. [place] of regression in wound condition at [phone]. o Please direct any NON-WOUND related issues/requests for orders to patient's Primary Care Physician Negative Pressure Wound Therapy Wound #7 Left,Lateral,Dorsal Foot o Wound VAC settings at 125/130 mmHg continuous pressure. Use BLACK/GREEN foam to wound cavity. Use WHITE foam to fill any tunnel/s and/or undermining. Change VAC dressing 3 X WEEK. Change canister as indicated when full. Nurse may titrate settings and frequency of dressing changes as clinically indicated. - Collagen with silver under sponge o Home Health Nurse may d/c VAC for s/s of increased infection, significant wound regression, or uncontrolled drainage. [place] at [phone]. o Apply contact layer over base of wound. o Number of foam/gauze pieces used in the dressing = Electronic Signature(s) Signed: 12/03/2019 4:25:47 PM By: Worthy Keeler [name] Signed: 12/04/2019 7:43:28 PM By: Gretta Cool, BSN, RN, CWS, Kim RN, BSN Entered By: Gretta Cool, BSN, RN, CWS, Kim on 12/03/2019 11:03:09 [name] ([id number]) -------------------------------------------------------------------------------- Problem List Details Patient Name: [name] Date of Service: 12/03/2019 9:15 AM Medical Record Number: [id number] Patient Account Number: [id number] Date of Birth/Sex: Sep 04, 1945 (74 y.o. 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 12/03/2019 No Yes muscle E11.42 Type 2 diabetes mellitus with diabetic polyneuropathy 12/03/2019  No Yes M21.372 Foot drop, left foot 12/03/2019 No Yes Inactive Problems Resolved Problems Electronic Signature(s) Signed: 12/03/2019 10:32:25 AM By: Worthy Keeler [name] Entered By: Worthy Keeler on 12/03/2019 10:32:24 [name] ([id number]) -------------------------------------------------------------------------------- Progress Note Details Patient Name: [name] Date of Service: 12/03/2019 9:15 AM Medical Record Number: [id number] Patient Account Number: [id number] Date of Birth/Sex: 11/02/1945 (74 y.o. M) Treating RN: Cornell Barman Primary Care Provider: [name] Other Clinician: Referring Provider: Referral, Self Treating Provider/Extender: [name], [place] Weeks in Treatment: 0 Subjective Chief Complaint Information obtained from Patient Left plantar foot ulcer s/p surgical debridement and wound VAC placement. Left great toe ulcer. History of Present Illness (HPI) 10/04/16 on evaluation today patient presents for initial evaluation concerning a laceration of the right wrist and hand on the bowler aspect. He tells me that he fell into glass following a medication change in regard to his diabetes. He initially went to [facility] where she was sutured and subsequently his daughter who is a Marine scientist removed the teachers at the appropriate time. Unfortunately the wound has done poorly and dehissed which has caused him some trouble including infection. He has been on antibiotics both topically and orally though the wound continues to show signs of necrotic tissue according to notes which is what he was sent to [place] for evaluation concerning. He does have diabetes and unfortunately this is severely uncontrolled. He does see an endocrinologist and is working on this. 10/11/2016 -- the patient's notes  from the ER were reviewed and he had his suturing done on 09/17/2016 and had necrotic skin flaps which had to be trimmed the last visit he was here. He has managed to get his  Santyl ointment and has been using this appropriately. 10/21/16 patient's wound appears to be doing very well on evaluation today. He still has some Sl[place] covering the wound bed but this is nowhere near as severe as when i saw this initially two weeks ago nor even my review of his pictures last week. I'm pleased with how this is progressing. Patient's wound over the right form appears to be doing fairly well on evaluation today. There is no evidence of infection and he has a small area at the crease of where she is wrist extends and flexes that is still open and appears to be having difficulty due to the fact that it is cracking open. I think this is something that may be controlled with moisture control that is keeping the wound a little bit more moist. Nonetheless this is a tiny region compared to the initial wound and overall appears to be doing very well. No fevers, chills, nausea, or vomiting noted at this time. 11/25/16 on evaluation today patient appears to be doing well in regard to his right wrist ulcer. In fact this appears to be completely healed he is having no discomfort. He is pleased with how this is progressed. Readmission: 06/24/17 on evaluation today patient appears back in our clinic regarding issues that he has been having with his right ankle on the lateral malleolus as well is in the dorsal surface of his left foot. Both have been going on for several weeks unfortunately. He tells me that initially this was being managed by Dr. Edilia Bo and I did review the notes from Dr. Edilia Bo in epic. Patient was placed on amoxicillin initially and then subsequently Keflex following. He has been using triple antibiotic ointment over-the-counter along with a Band-Aid for the wound currently. He did not have any Santyl remaining. His most recent hemoglobin A1c which was just two days ago was 10.3. Fortunately he seems to have excellent blood flow in the bilateral lower extremities with his left  ABI being 0.95 in the right ABI 1.01. Patient does have some pain in regard to each of these ulcerations but he tells me it's not nearly as severe as the hand wound that I help treat earlier over the summer. This was in 2018. 07/15/17 on evaluation today patient's lower surety ulcer is actually both appear to be doing fairly well at this point. He does have a little bit of skin overlapping the ulcer on the right lateral ankle this was debrided away today just with saline and gauze without complication patient had no discomfort or pain. Otherwise patient's left foot ulcer does appear to be showing signs of improvement he does seem to have a right great toe. Nikki and noted at this point in time. I think that this does need to be managed at this point. No fevers, chills, nausea, or vomiting noted at this time. 07/22/17 on evaluation today patient's wounds in regard to his lower extremities appear to be doing somewhat better. His right ankle wound in fact appears to likely be completely closed. The left dorsal foot ulcer though still open does appear to be a little bit smaller and does also seem to be making good progress. He has not been having problems with the treatment at this point in the general he does not  seem to show as much erythema surrounding the wound bed which is good news. He is not having significant discomfort. 08/12/17 on evaluation today patients wounds of both appeared to be doing better in fact they both appear to be completely healed. Overall I'm very pleased with how things have progressed over the past week even more than what I expected. Obviously this is good news and he is happy about this. He did have a fall when going to his orthopedic doctor recently fortunately he does not appear to have injured anything severely but nonetheless does have a couple of bumps and bruises nothing significant at this time but he needs to be seen and wound care for. He may have broken his left wrist  he is following up with orthopedics in that regard. Readmission: 10/24/17 on evaluation today patient presents for reevaluation after having been discharged Aug 12, 2017. His wound was healed at that point although he states that it has been somewhat discolored since that time. He has not experienced the drainage as far as the wound is concerned but the fact that it was still discolored been greater than two months after the fact made him want to come in for reevaluation. Upon inspection today the patient has no pain although he does have neuropathy. He does have a discolored region of the dorsal surface of the left foot which was the site where the ulcer was that we previously treated. The good news is he does not again have any discomfort although obviously I do believe this may be some scar tissue and just discoloration in that regard. Again he hasn't had any drainage or discharge since I last saw him and at this point I see no evidence of that either this area shows complete epithelialization. In general I do not feel like there's any significant issue at this point in that regard. [name], [name] ([id number]) 02/09/2019 This is a 74 year old man with type 2 diabetes poorly controlled with significant peripheral neuropathy. He has been in this clinic before in 2018 with a laceration on his right hand and then again in 2019 with an area over the right lateral malleolus. 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 He states that the wound is been there for the last week or 2. It is been uncomfortable to walk on. The man is an active man having to look after an elderly mother. Past medical history, type 2 diabetes with polyneuropathy with a recent hemoglobin A1c in September 2011 0.3, lumbar disc disease, hypergonadism., Left foot drop and gout. We did not repeat his ABIs in the clinic quoting values from  July 2019 of 0.95 and 1.01. He is not felt to have significant PAD 02/19/2019 on evaluation today patient actually appears to be doing better with regard to his foot wound based on what I am seeing currently. He was seen by Dr. Dellia Nims on readmission back into the clinic last week when I was off. The good news is the patient has been tolerating the dressing changes without any complication in fact has not really had much in the way of drainage. He does have an AFO brace which she believes is actually pushing on this area as well and has contributed to the issue. Fortunately there is no signs of active infection at this point. His ABIs appear to be well. 03/05/2019 on evaluation today patient appears to be  doing well in regard to his foot ulcer. He has been tolerating the dressing changes without complication. Fortunately there is no signs of active infection. The wound is measuring somewhat smaller and overall I feel like he is making good progress at this point. 03/12/2019 on evaluation today patient actually appears to be doing quite well with regard to his foot ulcer. The wound is looking better the measurement is not significantly better at this point unfortunately but nonetheless again the overall appearance is improving I think he is making some progress. I still think the big issue here is foot drop and again the patient wants to see if potentially he could get a brace to help with this. Subsequently I think that we can definitely make a referral to [facility] to see if they can help [place] in this regard. He is in agreement with that plan. 03/26/2019 on evaluation today patient appears to be doing more poorly in regard to his foot ulcer at this time. He did see podiatry in order to see about getting a brace but unfortunately he tells me that Medicare is not likely to pay for one for him as he had 1 I guess 8 months ago. However this one that apparently goes in his shoes that does not seem to be  working out well for him. Unfortunately there are signs of active infection at this time. No fevers, chills, nausea, vomiting, or diarrhea. Systemically. The patient has erythema on the dorsal surface of his foot that tracks from the lateral portion of his foot and again he does have a blister around the wound as well which is worse compared to previous. Overall I am concerned. 04/02/2019 on evaluation today patient appears to be doing about the same with regard to his wound. Unfortunately there is no signs of significant improvement although I do think there is no signs of infection which is good news. His drainage is also slowed down tremendously. I think at this point he would benefit from the initiation of a total contact cast. The only concern I have is that over his balance. I explained to the patient that if we were to initiate the cast I think it would be greatly beneficial for him but he would need to ensure not to walk at all unless he uses a walker he actually has 2 walkers one for inside his home and one that he keeps out in the car. For that reason he would be covered in that regard. I just do not want him to have any accidents and fall with the cast. 12/28-Patient comes in 1 week out a day early for his appointment he was in the hospital for 2 days for uncontrolled hyperglycemia and required management of high blood sugars, with some adjustments in his home regimen of insulin, patient stubbed his right great toe today and put a Band-Aid on it, he is here for his left lateral foot ulcer and we are using silver alginate dressing, he was intact total contact cast with difficulty with balance to the extent that that had to be discontinued 04/16/2019 on evaluation today patient appears to be doing better with regard to the overall appearance of his wound size. This is good news. We had attempted a total contact cast but unfortunately he had some falling issues that the family contacted [place] about  we took the cast off we did not put it back on. Nonetheless it ended up that just a couple days later he was in the  hospital due to elevations in his blood sugar which went to around 500 and even a little higher. Subsequently he was admitted to the hospital and was in the hospital for a total of 3 days and that included Christmas day. Fortunately there got his blood sugar under control he tells me that this is running about 200 now which is much better news. There does not appear to be any signs of active infection at this time. No fevers, chills, nausea, vomiting, or diarrhea. 04/30/2019 on evaluation today patient actually appears to be doing slightly worse compared to previous as far as the overall size of his wound is concerned. There also is some need for sharp debridement today as well. Fortunately there is no evidence of active infection which is good news. No fevers, chills, nausea, vomiting, or diarrhea. 05/07/2019 upon evaluation today patient appears to be doing excellent in regard to his plantar foot ulcer. He has been tolerating the dressing changes without complication. Fortunately there is no signs of active infection at this time. No fevers, chills, nausea, vomiting, or diarrhea. He does tell me that he actually got in touch with [employer] and they feel like they can add diagnosis codes to his orders in order to get an external brace for him as opposed to the one that goes internal to issue. That would be excellent for his foot drop and I did tell him that if there is anything I need to fill out in that regard I will be more than happy to oblige and fill out what is needed for [employer]. 05/14/2019 upon evaluation today patient's original wound site actually appears to be doing much better which is good news. Fortunately there is no signs of active infection at this time. No fevers, chills, nausea, vomiting, or diarrhea. With that being said the patient unfortunately is continuing to have  issues with rubbing he tells me that the felt slid on his foot although he did not realize it until today. Nonetheless I think this did cause a blister around the edge of the wound and he does an open wound that is slightly larger overall in measurement compared to last time mainly due to this blister. Other than that I feel like he is doing quite well. 05/21/2019 upon evaluation today patient appears to be doing excellent in regard to his wound at this time on the plantar foot. He is showing signs of improvement he does have some slough and biofilm noted on the surface of the wound this is going require some sharp debridement today as well. Fortunately there is no evidence of active infection. No fevers, chills, nausea, vomiting, or diarrhea. 05/28/2019 on evaluation today patient's wound actually appears to be measuring smaller upon initial inspection and I do believe that it is doing well. With that being said he continues to have a blister on the outside/lateral edge of the wound that occurs as a result of rubbing due to his dropfoot currently. Subsequently we have filled out the paperwork for his new external brace but again that does take several weeks to get that completed once everything is returned to back we did send this back last week as far as the paperwork was concerned. With that being said there does not appear to be any signs of active infection at this time. 06/04/2019 on evaluation today patient's wound actually appears to be doing slightly better compared to last week which is good news. He still [name], [name] ([id number]) states that his insurance unfortunately  does not seem to want to cover any of his new brace. He is going to just pay for this himself. Nonetheless he is going by there today when he leaves here that is [employer] in [place] that is going to be making this brace for him. 06/18/2019 upon evaluation today patient fortunately does have his new brace which is the  external AFO brace with bilateral uprights. Subsequently he seems to be doing much better in fact the wound already appears better. He has not even been using the felt he is just using a dressing over top of the wound and overall I am very pleased with how things seem to be progressing. There is no signs of active infection at this time which is great news and I think that the AFO is good to help him tremendously as far as trying to keep pressure off of the 06/25/2019 upon evaluation today patient seems to be making good progress in regard to his wound. He has been tolerating the dressing changes without complication. Fortunately there is no signs of active infection at this time. No fevers, chills, nausea, vomiting, or diarrhea. 07/02/2019 upon evaluation today patient unfortunately had a blistered area on the bottom of his foot tracking more towards the midline of the plantar aspect. He does not seem to have issues with rubbing more laterally that he used to have but nonetheless this is still giving him some trouble and causing problems here. Fortunately there is no signs of active infection at this time. No fevers, chills, nausea, vomiting, or diarrhea. 07/09/2019 upon evaluation today patient appears to be doing well with regard to his plantar foot ulcer. Definitely this is much improved compared to last week's visit which again last week the issue there was that he had an area of callus buildup on the plantar aspect of his foot I was able to clean this away and after effectively doing so the wound seems to be doing much better which is great news. There is no signs of active infection at this time. 07/16/2019 upon evaluation today patient appears to be making some progress in my opinion with regard to his plantar foot ulcer. He has been tolerating the dressing changes without complication. Fortunately there is no evidence of active infection at this time which is great news. 07/23/2019 upon evaluation  today patient appears to be doing well with regard to his wounds on the plantar foot. He has been tolerating the dressing changes without complication. These do seem to be healing. 08/06/2019 upon evaluation today patient's wound actually appears to be showing some signs of improvement though he still has a significant amount of callus buildup over this location. That is can require some debridement today. With that being said the actual wound openings seem to be dramatically improved which is great news. There is no signs of active infection at this time. No fevers, chills, nausea, vomiting, or diarrhea. 08/13/2019 upon evaluation today patient appears to be doing well with regard to his foot ulcer. He has been tolerating the dressing changes without complication. Fortunately there is no signs of active infection at this time. No fevers, chills, nausea, vomiting, or diarrhea. 08/20/2019 upon evaluation today patient appears to be doing excellent in regard to his foot ulcer. I do believe he still showing signs of improvement this is very slow but again I think still pressure getting to the area even with his brace and custom shoe is still part of the main issue with the delay here. Nonetheless  I do not see any signs of worsening or infection all of which is good news. 5/20; left lateral foot wound. He wears an AFO brace for foot drop related to his diabetes. The wound is small and clean looking he is using silver collagen. We have apparently tried him in offloading shoe he could not tolerate this. He does not have good balance. 09/11/2019 upon evaluation today patient appears to be doing more poorly in regard to his foot ulcer as compared to last time I saw him. Unfortunately he does tell me as we discussed on Friday that he had pus and blood coming from the area underneath the callus. Unfortunately I think that this is a combination of the callus not being trimmed away enough and potentially the felt pad  causing an abnormal rubbing which has happened before which led to this becoming more irritated and likely infected based on what I am seeing today. The patient takes ampicillin all the time. Subsequently he also states that he has been trying for a couple of days to take the clindamycin of which she had some leftover from previous. With that being said it upset his stomach. The antibiotic that I sent in for him on Friday unfortunately I wrote the wrong number on as far as the number of pills that he needed. With that being said I corrected that this morning but he tells me is not can pick it up as that seems to be making him have trouble with his stomach being upset at this point. Fortunately there is no signs of active infection at this time systemically. 09/17/2019 upon evaluation today patient appears to be doing better in regard to his foot ulcer. Has been taking ampicillin that he had leftover from a previous prescription. With that being said he did go see his doctor today concerning his diabetes and notes that his hemoglobin A1c was 13 obviously this is elevated and there can be working on that. Also did review the culture result that we finally got back for final well that showed rare Pseudomonas and a few group B strep noted. The Pseudomonas was sensitive to Cipro but again the wound is doing so much better with the ampicillin I think this is probably not a causative organism I do not see anything that really has me concerned as far as infection is concerned today. Therefore I would hold off on prescribing Cipro for the patient and have him discontinue the ampicillin until completion. 09/24/2019 upon evaluation today patient appears to be doing 09/24/2019 upon evaluation today patient appears to be doing well with regard to his foot ulcer. He actually has not enlarged any and seems to be measuring smaller quite significantly at this point. His toe ulcer is also doing somewhat better which is good  news. Fortunately there is no evidence of active infection at this time. No fevers, chills, nausea, vomiting, or diarrhea. 10/08/2019 upon evaluation today patient appears to be doing well with regard to his foot ulcer all things considered. Fortunately there is no signs of active infection at this time. No fevers, chills, nausea, vomiting, or diarrhea. Fortunately he states that overall he has been doing much better with regard to not have any blistering on the side of his foot. Unfortunately he did have a fall last night due to the fact that he got up to walk without his brace on and did not have his walker either it was around 4 in the morning and his mother was calling out.  Subsequently he states that the foot drop got him and he ended up following. Nonetheless he states he normally uses his walker I explained he needs to try to make sure not to ever go without the walker. No matter how attempting the situation may be. Readmission: 12/03/2019 upon evaluation today patient presents for follow-up after having had surgery last week on his foot for surgical debridement and then subsequent wound VAC placement. He had gone into the hospital as he was having a significant infection and not feeling well. He is unfortunately since I last saw him also been under the care of the cancer doctors and they have been taking good care of him. He does seem to be in fairly high spirits all things considered. With that being said he has lung cancer he tells me that his back from the radiation feels like it is constantly burned. Patient History Information obtained from Patient. Allergies tetracycline ADA, [place] ([id number]) Family History Cancer - Siblings, Diabetes - Father, Hypertension - Father, Kidney Disease - Mother, Stroke - Father, No family history of Heart Disease, Lung Disease, Thyroid Problems, Tuberculosis. Social History Former smoker, Marital Status - Widowed, Alcohol Use - Never, Drug Use -  No History, Caffeine Use - Moderate. Medical History Eyes Patient has history of Cataracts - 2018 Ear/Nose/Mouth/Throat Patient has history of Middle ear problems Denies history of Chronic sinus problems/congestion Hematologic/Lymphatic Denies history of Anemia, Hemophilia, Human Immunodeficiency Virus, Lymphedema, Sickle Cell Disease Respiratory Patient has history of Sleep Apnea Denies history of Aspiration, Asthma, Chronic Obstructive Pulmonary Disease (COPD), Pneumothorax, Tuberculosis Cardiovascular Patient has history of Hypertension Denies history of Angina, Arrhythmia, Congestive Heart Failure, Coronary Artery Disease, Deep Vein Thrombosis, Hypotension, Myocardial Infarction, Peripheral Arterial Disease, Peripheral Venous Disease, Phlebitis, Vasculitis Gastrointestinal Denies history of Cirrhosis , Colitis, Crohn s, Hepatitis A, Hepatitis B, Hepatitis C Endocrine Patient has history of Type II Diabetes Genitourinary Denies history of End Stage Renal Disease Immunological Denies history of Lupus Erythematosus, Raynaud s, Scleroderma Musculoskeletal Patient has history of Gout - history, Osteoarthritis Denies history of Rheumatoid Arthritis, Osteomyelitis Neurologic Patient has history of Neuropathy Denies history of Dementia, Quadriplegia, Paraplegia, Seizure Disorder Oncologic Patient has history of Received Chemotherapy - chemo 3x/week Denies history of Received Radiation Hospitalization/Surgery History - hospitalized 8/12-8/19/21 for wound debriedement. 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 Ear/Nose/Mouth/Throat Denies complaints or symptoms of Difficult clearing ears, Sinusitis. Hematologic/Lymphatic Denies complaints or symptoms of Bleeding / Clotting Disorders, Human  Immunodeficiency Virus. Respiratory Denies complaints or symptoms of Chronic or frequent coughs, Shortness of Breath. Cardiovascular Denies complaints or symptoms of Chest pain, LE edema. Gastrointestinal Denies complaints or symptoms of Frequent diarrhea, Nausea, Vomiting. Endocrine Denies complaints or symptoms of Hepatitis, Thyroid disease, Polydypsia (Excessive Thirst). Genitourinary Denies complaints or symptoms of Kidney failure/ Dialysis, Incontinence/dribbling. Immunological Denies complaints or symptoms of Hives, Itching. Integumentary (Skin) Complains or has symptoms of Wounds - ulcer on left foot, Breakdown - ulcer on left foot. Denies complaints or symptoms of Swelling. Musculoskeletal Denies complaints or symptoms of Muscle Pain, Muscle Weakness. Neurologic Complains or has symptoms of Numbness/parasthesias - neuropathy. Denies complaints or symptoms of Focal/Weakness. Psychiatric Denies complaints or symptoms of Anxiety, Claustrophobia. [name], [name] ([id number]) Objective Constitutional sitting or standing blood pressure is within target range for patient.. pulse regular and within target range for patient.[name] Kitchen respirations  regular, non- labored and within target range for patient.[name] Kitchen temperature within target range for patient.. Well-nourished and well-hydrated in no acute distress. Vitals Time Taken: 9:35 AM, Height: 71 in, Source: Stated, Weight: 190 lbs, Source: Stated, BMI: 26.5, Temperature: 98.2 F, Pulse: 128 bpm, Respiratory Rate: 20 breaths/min, Blood Pressure: 138/66 mmHg. Eyes conjunctiva clear no eyelid edema noted. pupils equal round and reactive to light and accommodation. Ears, Nose, Mouth, and Throat no gross abnormality of ear auricles or external auditory canals. normal hearing noted during conversation. mucus membranes moist. Respiratory normal breathing without difficulty. Cardiovascular 2+ dorsalis pedis/posterior tibialis pulses. no clubbing,  cyanosis, significant edema, Musculoskeletal Patient unable to walk without assistance. no significant deformity or arthritic changes, no loss or range of motion, no clubbing. Psychiatric this patient is able to make decisions and demonstrates good insight into disease process. Alert and Oriented x 3. pleasant and cooperative. General Notes: Upon inspection patient's wound actually showed signs of fairly good granulation in my opinion he did have some necrotic tissue which is can require some debridement to clear away. Nonetheless I feel like the wound VAC is still probably the best way to go at this time which is great news. Fortunately there is no signs of systemic or even local infection at this point which is also good news. In general I am pleased with the overall plan and progress for the patient. As far as offloading he was not able to use the knee scooter he just been using a wheelchair which I think is appropriate as well. He does need to be offloading as Dr. Rolena Infante told him as well. That is the surgeon who performed the surgery when he was in the hospital. Integumentary (Hair, Skin) Wound #7 status is Open. Original cause of wound was Surgical Injury. The wound is located on the Left,Lateral,Dorsal Foot. The wound measures 2.7cm length x 3cm width x 0.5cm depth; 6.362cm^2 area and 3.181cm^3 volume. There is Fat Layer (Subcutaneous Tissue) exposed. There is no tunneling noted, however, there is undermining starting at 9:00 and ending at 11:00 with a maximum distance of 1.7cm. There is a medium amount of serosanguineous drainage noted. The wound margin is flat and intact. There is small (1-33%) pink granulation within the wound bed. There is a large (67-100%) amount of necrotic tissue within the wound bed including Adherent Slough. Wound #8 status is Open. Original cause of wound was Pressure Injury. The wound is located on the [employer]. The wound measures 0.6cm length x 0.7cm  width x 0.1cm depth; 0.33cm^2 area and 0.033cm^3 volume. 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 There was a Excisional Skin/Subcutaneous Tissue Debridement with a total area of 8.1 sq cm performed by [name], Sherrilynn Gudgel E., [name]. With the following instrument(s): Curette to remove Viable and Non-Viable tissue/material. Material removed includes Subcutaneous Tissue and Slough and. No specimens were taken. A time out was conducted at 10:50, prior to the start of the procedure. A Minimum amount of bleeding was controlled with Pressure. The procedure was tolerated well. Post Debridement Measurements: 2.7cm length x 3cm width x 0.5cm depth; 3.181cm^3 volume. Character of Wound/Ulcer Post Debridement  is stable. Severity of Tissue Post Debridement is: Fat layer exposed. Post procedure Diagnosis Wound #7: Same as Pre-Procedure Plan Wound Cleansing: Wound #7 Left,Lateral,Dorsal Foot: Clean wound with Normal Saline. Wound #8 Left,Distal Toe Great: Clean wound with Normal Saline. Anesthetic (add to Medication List): Wound #7 Left,Lateral,Dorsal Foot: Topical Lidocaine 4% cream applied to wound bed prior to debridement (In Clinic Only). Wound #8 Left,Distal Toe Great: Topical Lidocaine 4% cream applied to wound bed prior to debridement (In Clinic Only). Skin Barriers/Peri-Wound Care: Wound #7 Left,Lateral,Dorsal Foot: Skin Prep Wound #8 Left,Distal Toe Great: Skin Prep Primary Wound Dressing: Wound #7 Left,Lateral,Dorsal Foot: Silver Collagen Wound #8 Left,Distal Toe  Great: Other: - Betadine Secondary Dressing: Wound #8 Left,Distal Toe Great: Other - coverlet Dressing Change Frequency: Wound #7 Left,Lateral,Dorsal Foot: Change Dressing Monday, Wednesday, Friday Wound #8 Left,Distal Toe Great: Change Dressing Monday, Wednesday, Friday Follow-up Appointments: Wound #7 Left,Lateral,Dorsal Foot: Return Appointment in 1 week. Nurse Visit as needed Wound #8 Left,Distal Toe Great: Return Appointment in 1 week. Nurse Visit as needed Edema Control: Wound #7 Left,Lateral,Dorsal Foot: Elevate legs to the level of the heart and pump ankles as often as possible Off-Loading: Wound #7 Left,Lateral,Dorsal Foot: Other: - No weight on foot, Wheelchair only Additional Orders / Instructions: Wound #7 Left,Lateral,Dorsal Foot: Increase protein intake. Wound #8 Left,Distal Toe Great: Increase protein intake. Home Health: Wound #7 Left,Lateral,Dorsal Foot: Continue Home Health Visits - [place] Nurse may visit PRN to address patient s wound care needs. FACE TO FACE ENCOUNTER: MEDICARE and MEDICAID PATIENTS: I certify that this patient is under my care and that I had a face-to-face encounter that meets the physician face-to-face encounter requirements with this patient on this date. The encounter with the patient was in whole or in part for the following MEDICAL CONDITION: (primary reason for [place]) MEDICAL NECESSITY: I certify, that based on my findings, NURSING services are a medically necessary home health service. HOME BOUND STATUS: I certify that my clinical findings support that this patient is homebound (i.e., Due to illness or injury, pt requires aid of supportive devices such as crutches, cane, wheelchairs, walkers, the use of special transportation or the assistance of another person to leave their place of residence. There is a normal inability to leave the home and doing so requires considerable and taxing effort. Other absences  are for medical reasons / religious services and are infrequent or of short duration when for other reasons). If current dressing causes regression in wound condition, may D/C ordered dressing product/s and apply Normal Saline Moist Dressing daily until next [place] / Other MD appointment. [place] of regression in wound condition at [phone]. Please direct any NON-WOUND related issues/requests for orders to patient's Primary Care Physician Wound #8 Left,Distal Toe Great: [facility] Visits - [name], [name] ([id number]) Home Health Nurse may visit PRN to address patient s wound care needs. FACE TO FACE ENCOUNTER: MEDICARE and MEDICAID PATIENTS: I certify that this patient is under my care and that I had a face-to-face encounter that meets the physician face-to-face encounter requirements with this patient on this date. The encounter with the patient was in whole or in part for the following MEDICAL CONDITION: (primary reason for [place]) MEDICAL NECESSITY: I certify, that based on my findings, NURSING services are a medically necessary home health service. HOME BOUND STATUS: I certify that my clinical findings support that this patient is homebound (i.e., Due to illness or injury, pt requires  aid of supportive devices such as crutches, cane, wheelchairs, walkers, the use of special transportation or the assistance of another person to leave their place of residence. There is a normal inability to leave the home and doing so requires considerable and taxing effort. Other absences are for medical reasons / religious services and are infrequent or of short duration when for other reasons). If current dressing causes regression in wound condition, may D/C ordered dressing product/s and apply Normal Saline Moist Dressing daily until next [place] / Other MD appointment. [place] of regression in wound  condition at [phone]. Please direct any NON-WOUND related issues/requests for orders to patient's Primary Care Physician Negative Pressure Wound Therapy: Wound #7 Left,Lateral,Dorsal Foot: Wound VAC settings at 125/130 mmHg continuous pressure. Use BLACK/GREEN foam to wound cavity. Use WHITE foam to fill any tunnel/s and/or undermining. Change VAC dressing 3 X WEEK. Change canister as indicated when full. Nurse may titrate settings and frequency of dressing changes as clinically indicated. - Collagen with silver under sponge Home Health Nurse may d/c VAC for s/s of increased infection, significant wound regression, or uncontrolled drainage. [place] at [phone]. Apply contact layer over base of wound. Number of foam/gauze pieces used in the dressing = 1. I would recommend at this time that we go ahead and continue with the wound VAC we will add some collagen to the base of the wound underneath wound VAC which I think could be beneficial as well. 2. I am also can recommend that the patient continue to monitor for any signs of infection or worsening in general. Hopefully nothing like this will occur but nonetheless that is definitely something to keep an eye on here. 3. With regard to the great toe we use little bit of Betadine here but no further need for aggressive dressings I think are necessary. 4. He should continue using the wheelchair I really do not think that there is any way to appropriately offload him with any other measures. He cannot even use the knee scooter. We will see patient back for reevaluation in 1 week here in the clinic. If anything worsens or changes patient will contact our office for additional recommendations. Electronic Signature(s) Signed: 12/03/2019 3:42:53 PM By: Worthy Keeler [name] Entered By: Worthy Keeler on 12/03/2019 15:42:53 [name]  ([id number]) -------------------------------------------------------------------------------- ROS/PFSH Details Patient Name: [name] Date of Service: 12/03/2019 9:15 AM Medical Record Number: [id number] Patient Account Number: [id number] Date of Birth/Sex: [birth year] (74 y.o. M) Treating RN: Cornell Barman Primary Care Provider: [name] Other Clinician: Referring Provider: Referral, Self Treating Provider/Extender: [name], Sonni Barse Weeks in Treatment: 0 Information Obtained From Patient Eyes Complaints and Symptoms: Negative for: Dry Eyes; Vision Changes; Glasses / Contacts Medical History: Positive for: Cataracts - 2018 Ear/Nose/Mouth/Throat Complaints and Symptoms: Negative for: Difficult clearing ears; Sinusitis Medical History: Positive for: Middle ear problems Negative for: Chronic sinus problems/congestion Hematologic/Lymphatic Complaints and Symptoms: Negative for: Bleeding / Clotting Disorders; Human Immunodeficiency Virus Medical History: Negative for: Anemia; Hemophilia; Human Immunodeficiency Virus; Lymphedema; Sickle Cell Disease Respiratory Complaints and Symptoms: Negative for: Chronic or frequent coughs; Shortness of Breath Medical History: Positive for: Sleep Apnea Negative for: Aspiration; Asthma; Chronic Obstructive Pulmonary Disease (COPD); Pneumothorax; Tuberculosis Cardiovascular Complaints and Symptoms: Negative for: Chest pain; LE edema Medical History: Positive for: Hypertension Negative for: Angina; Arrhythmia; Congestive Heart Failure; Coronary Artery Disease; Deep Vein Thrombosis; Hypotension; Myocardial Infarction; Peripheral Arterial Disease; Peripheral Venous Disease; Phlebitis; Vasculitis Past Medical  History Notes: CVA in 2015 Gastrointestinal Complaints and Symptoms: Negative for: Frequent diarrhea; Nausea; Vomiting Medical History: Negative for: Cirrhosis ; Colitis; Crohnos; Hepatitis A; Hepatitis B; Hepatitis  C Endocrine Complaints and Symptoms: Negative for: Hepatitis; Thyroid disease; Polydypsia (Excessive Thirst) [name], [name] ([id number]) Medical History: Positive for: Type II Diabetes Time with diabetes: 20 yrs Treated with: Insulin Blood sugar tested every day: Yes Tested : TID Blood sugar testing results: Bedtime: 130 Genitourinary Complaints and Symptoms: Negative for: Kidney failure/ Dialysis; Incontinence/dribbling Medical History: Negative for: End Stage Renal Disease Past Medical History Notes: "weak kidneys" - is a patient at [facility] Immunological Complaints and Symptoms: Negative for: Hives; Itching Medical History: Negative for: Lupus Erythematosus; Raynaudos; Scleroderma Integumentary (Skin) Complaints and Symptoms: Positive for: Wounds - ulcer on left foot; Breakdown - ulcer on left foot Negative for: Swelling Musculoskeletal Complaints and Symptoms: Negative for: Muscle Pain; Muscle Weakness Medical History: Positive for: Gout - history; Osteoarthritis Negative for: Rheumatoid Arthritis; Osteomyelitis Neurologic Complaints and Symptoms: Positive for: Numbness/parasthesias - neuropathy Negative for: Focal/Weakness Medical History: Positive for: Neuropathy Negative for: Dementia; Quadriplegia; Paraplegia; Seizure Disorder Psychiatric Complaints and Symptoms: Negative for: Anxiety; Claustrophobia Oncologic Medical History: Positive for: Received Chemotherapy - chemo 3x/week Negative for: Received Radiation HBO Extended History Items Eyes: Ear/Nose/Mouth/Throat: Cataracts Middle ear problems Immunizations Pneumococcal Vaccine: Received Pneumococcal Vaccination: Yes [name], [name] ([id number]) Immunization Notes: up to date Implantable Devices None Hospitalization / Surgery History Type of Hospitalization/Surgery hospitalized 8/12-8/19/21 for wound debriedement Family and Social History Cancer: Yes - Siblings; Diabetes: Yes -  Father; Heart Disease: No; Hypertension: Yes - Father; Kidney Disease: Yes - Mother; Lung Disease: No; Stroke: Yes - Father; Thyroid Problems: No; Tuberculosis: No; Former smoker; Marital Status - Widowed; Alcohol Use: Never; Drug Use: No History; Caffeine Use: Moderate; Financial Concerns: No; Food, Clothing or Shelter Needs: No; Support System Lacking: No; Transportation Concerns: No Electronic Signature(s) Signed: 12/03/2019 4:25:47 PM By: Worthy Keeler [name] Signed: 12/04/2019 11:15:54 AM By: Darci Needle Signed: 12/04/2019 7:43:28 PM By: Gretta Cool BSN, RN, CWS, Kim RN, BSN Entered By: Darci Needle on 12/03/2019 10:26:29 [name] ([id number]) -------------------------------------------------------------------------------- [place] Details Patient Name: [name] Date of Service: 12/03/2019 Medical Record Number: [id number] Patient Account Number: [id number] Date of Birth/Sex: 1945-05-17 (74 y.o. 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 M21.372 Foot drop, left foot Facility Procedures CPT4 Code: 95638756 Description: 43329 - WOUND CARE VISIT-LEV 2 EST PT Modifier: Quantity: 1 CPT4 Code: [id number] Description: 11042 - DEB SUBQ TISSUE 20 SQ CM/< Modifier: Quantity: 1 CPT4 Code: Description: ICD-10 Diagnosis Description L97.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 Modifier: scle Quantity: Physician Procedures CPT4 Code: [id number] Description: 99214 - WC PHYS LEVEL 4 - EST PT Modifier: 25 Quantity: 1 CPT4 Code: Description: ICD-10 Diagnosis  Description E11.621 Type 2 diabetes mellitus with foot ulcer L97.523 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 m E11.42 Type 2 diabetes mellitus with diabetic polyneuropathy M21.372 Foot drop,  left foot Modifier: uscle Quantity: CPT4 Code: 1093235 Description: 11042 - WC PHYS SUBQ TISS 20 SQ CM Modifier: Quantity: 1 CPT4 Code: Description: ICD-10 Diagnosis Description L97.523  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 m Modifier: uscle Quantity: Electronic Signature(s) Signed: 12/03/2019 5:29:18 PM By: Gretta Cool, BSN, RN, CWS, Kim RN, BSN Signed: 12/04/2019 7:41:44 PM By: Worthy Keeler [name] Previous Signature: 12/03/2019 3:43:39 PM Version By: Worthy Keeler [name] Entered By: Gretta Cool, BSN, RN, CWS, Kim on 12/03/2019 17:29:18

## 2019-12-06 DIAGNOSIS — L97429 Non-pressure chronic ulcer of left heel and midfoot with unspecified severity: Secondary | ICD-10-CM | POA: Diagnosis not present

## 2019-12-06 DIAGNOSIS — E11621 Type 2 diabetes mellitus with foot ulcer: Secondary | ICD-10-CM | POA: Diagnosis not present

## 2019-12-06 DIAGNOSIS — R000 Tachycardia, unspecified: Secondary | ICD-10-CM | POA: Diagnosis not present

## 2019-12-06 DIAGNOSIS — E041 Nontoxic single thyroid nodule: Secondary | ICD-10-CM | POA: Diagnosis not present

## 2019-12-06 DIAGNOSIS — J45909 Unspecified asthma, uncomplicated: Secondary | ICD-10-CM | POA: Diagnosis not present

## 2019-12-06 DIAGNOSIS — E1165 Type 2 diabetes mellitus with hyperglycemia: Secondary | ICD-10-CM | POA: Diagnosis not present

## 2019-12-06 DIAGNOSIS — L02612 Cutaneous abscess of left foot: Secondary | ICD-10-CM | POA: Diagnosis not present

## 2019-12-06 DIAGNOSIS — W010XXD Fall on same level from slipping, tripping and stumbling without subsequent striking against object, subsequent encounter: Secondary | ICD-10-CM | POA: Diagnosis not present

## 2019-12-06 DIAGNOSIS — C3491 Malignant neoplasm of unspecified part of right bronchus or lung: Secondary | ICD-10-CM | POA: Diagnosis not present

## 2019-12-06 DIAGNOSIS — G4733 Obstructive sleep apnea (adult) (pediatric): Secondary | ICD-10-CM | POA: Diagnosis not present

## 2019-12-06 DIAGNOSIS — E1142 Type 2 diabetes mellitus with diabetic polyneuropathy: Secondary | ICD-10-CM | POA: Diagnosis not present

## 2019-12-06 DIAGNOSIS — Z794 Long term (current) use of insulin: Secondary | ICD-10-CM | POA: Diagnosis not present

## 2019-12-06 DIAGNOSIS — S2232XD Fracture of one rib, left side, subsequent encounter for fracture with routine healing: Secondary | ICD-10-CM | POA: Diagnosis not present

## 2019-12-06 DIAGNOSIS — N1831 Chronic kidney disease, stage 3a: Secondary | ICD-10-CM | POA: Diagnosis not present

## 2019-12-06 DIAGNOSIS — Z87891 Personal history of nicotine dependence: Secondary | ICD-10-CM | POA: Diagnosis not present

## 2019-12-06 DIAGNOSIS — I129 Hypertensive chronic kidney disease with stage 1 through stage 4 chronic kidney disease, or unspecified chronic kidney disease: Secondary | ICD-10-CM | POA: Diagnosis not present

## 2019-12-06 DIAGNOSIS — Z9181 History of falling: Secondary | ICD-10-CM | POA: Diagnosis not present

## 2019-12-07 ENCOUNTER — Telehealth: Payer: Self-pay | Admitting: Internal Medicine

## 2019-12-07 ENCOUNTER — Inpatient Hospital Stay: Payer: Medicare Other

## 2019-12-07 DIAGNOSIS — E1165 Type 2 diabetes mellitus with hyperglycemia: Secondary | ICD-10-CM | POA: Diagnosis not present

## 2019-12-07 DIAGNOSIS — I129 Hypertensive chronic kidney disease with stage 1 through stage 4 chronic kidney disease, or unspecified chronic kidney disease: Secondary | ICD-10-CM | POA: Diagnosis not present

## 2019-12-07 DIAGNOSIS — G4733 Obstructive sleep apnea (adult) (pediatric): Secondary | ICD-10-CM | POA: Diagnosis not present

## 2019-12-07 DIAGNOSIS — E11621 Type 2 diabetes mellitus with foot ulcer: Secondary | ICD-10-CM | POA: Diagnosis not present

## 2019-12-07 DIAGNOSIS — R000 Tachycardia, unspecified: Secondary | ICD-10-CM | POA: Diagnosis not present

## 2019-12-07 DIAGNOSIS — E1142 Type 2 diabetes mellitus with diabetic polyneuropathy: Secondary | ICD-10-CM | POA: Diagnosis not present

## 2019-12-07 DIAGNOSIS — L02612 Cutaneous abscess of left foot: Secondary | ICD-10-CM | POA: Diagnosis not present

## 2019-12-07 DIAGNOSIS — C3491 Malignant neoplasm of unspecified part of right bronchus or lung: Secondary | ICD-10-CM | POA: Diagnosis not present

## 2019-12-07 DIAGNOSIS — L97429 Non-pressure chronic ulcer of left heel and midfoot with unspecified severity: Secondary | ICD-10-CM | POA: Diagnosis not present

## 2019-12-07 DIAGNOSIS — N1831 Chronic kidney disease, stage 3a: Secondary | ICD-10-CM | POA: Diagnosis not present

## 2019-12-07 DIAGNOSIS — Z9181 History of falling: Secondary | ICD-10-CM | POA: Diagnosis not present

## 2019-12-07 DIAGNOSIS — Z794 Long term (current) use of insulin: Secondary | ICD-10-CM | POA: Diagnosis not present

## 2019-12-07 DIAGNOSIS — J45909 Unspecified asthma, uncomplicated: Secondary | ICD-10-CM | POA: Diagnosis not present

## 2019-12-07 DIAGNOSIS — S2232XD Fracture of one rib, left side, subsequent encounter for fracture with routine healing: Secondary | ICD-10-CM | POA: Diagnosis not present

## 2019-12-07 DIAGNOSIS — Z87891 Personal history of nicotine dependence: Secondary | ICD-10-CM | POA: Diagnosis not present

## 2019-12-07 DIAGNOSIS — W010XXD Fall on same level from slipping, tripping and stumbling without subsequent striking against object, subsequent encounter: Secondary | ICD-10-CM | POA: Diagnosis not present

## 2019-12-07 DIAGNOSIS — E041 Nontoxic single thyroid nodule: Secondary | ICD-10-CM | POA: Diagnosis not present

## 2019-12-07 NOTE — Telephone Encounter (Signed)
Scheduled per los. Called and spoke with patient. Confirmed appt 

## 2019-12-10 ENCOUNTER — Other Ambulatory Visit: Payer: Self-pay

## 2019-12-10 ENCOUNTER — Inpatient Hospital Stay: Payer: Medicare Other

## 2019-12-10 ENCOUNTER — Encounter: Payer: Medicare Other | Admitting: Physician Assistant

## 2019-12-10 DIAGNOSIS — Z881 Allergy status to other antibiotic agents status: Secondary | ICD-10-CM | POA: Diagnosis not present

## 2019-12-10 DIAGNOSIS — I10 Essential (primary) hypertension: Secondary | ICD-10-CM | POA: Diagnosis not present

## 2019-12-10 DIAGNOSIS — Z9181 History of falling: Secondary | ICD-10-CM | POA: Diagnosis not present

## 2019-12-10 DIAGNOSIS — L97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scle: Secondary | ICD-10-CM | POA: Diagnosis not present

## 2019-12-10 DIAGNOSIS — G4730 Sleep apnea, unspecified: Secondary | ICD-10-CM | POA: Diagnosis not present

## 2019-12-10 DIAGNOSIS — L97429 Non-pressure chronic ulcer of left heel and midfoot with unspecified severity: Secondary | ICD-10-CM | POA: Diagnosis not present

## 2019-12-10 DIAGNOSIS — W010XXD Fall on same level from slipping, tripping and stumbling without subsequent striking against object, subsequent encounter: Secondary | ICD-10-CM | POA: Diagnosis not present

## 2019-12-10 DIAGNOSIS — Z794 Long term (current) use of insulin: Secondary | ICD-10-CM | POA: Diagnosis not present

## 2019-12-10 DIAGNOSIS — E1165 Type 2 diabetes mellitus with hyperglycemia: Secondary | ICD-10-CM | POA: Diagnosis not present

## 2019-12-10 DIAGNOSIS — C3490 Malignant neoplasm of unspecified part of unspecified bronchus or lung: Secondary | ICD-10-CM | POA: Diagnosis not present

## 2019-12-10 DIAGNOSIS — S2232XD Fracture of one rib, left side, subsequent encounter for fracture with routine healing: Secondary | ICD-10-CM | POA: Diagnosis not present

## 2019-12-10 DIAGNOSIS — E11621 Type 2 diabetes mellitus with foot ulcer: Secondary | ICD-10-CM | POA: Diagnosis not present

## 2019-12-10 DIAGNOSIS — I129 Hypertensive chronic kidney disease with stage 1 through stage 4 chronic kidney disease, or unspecified chronic kidney disease: Secondary | ICD-10-CM | POA: Diagnosis not present

## 2019-12-10 DIAGNOSIS — L89899 Pressure ulcer of other site, unspecified stage: Secondary | ICD-10-CM | POA: Diagnosis not present

## 2019-12-10 DIAGNOSIS — L02612 Cutaneous abscess of left foot: Secondary | ICD-10-CM | POA: Diagnosis not present

## 2019-12-10 DIAGNOSIS — G4733 Obstructive sleep apnea (adult) (pediatric): Secondary | ICD-10-CM | POA: Diagnosis not present

## 2019-12-10 DIAGNOSIS — N1831 Chronic kidney disease, stage 3a: Secondary | ICD-10-CM | POA: Diagnosis not present

## 2019-12-10 DIAGNOSIS — J45909 Unspecified asthma, uncomplicated: Secondary | ICD-10-CM | POA: Diagnosis not present

## 2019-12-10 DIAGNOSIS — R000 Tachycardia, unspecified: Secondary | ICD-10-CM | POA: Diagnosis not present

## 2019-12-10 DIAGNOSIS — E041 Nontoxic single thyroid nodule: Secondary | ICD-10-CM | POA: Diagnosis not present

## 2019-12-10 DIAGNOSIS — Z87891 Personal history of nicotine dependence: Secondary | ICD-10-CM | POA: Diagnosis not present

## 2019-12-10 DIAGNOSIS — C3491 Malignant neoplasm of unspecified part of right bronchus or lung: Secondary | ICD-10-CM | POA: Diagnosis not present

## 2019-12-10 DIAGNOSIS — E1142 Type 2 diabetes mellitus with diabetic polyneuropathy: Secondary | ICD-10-CM | POA: Diagnosis not present

## 2019-12-10 DIAGNOSIS — M21372 Foot drop, left foot: Secondary | ICD-10-CM | POA: Diagnosis not present

## 2019-12-10 NOTE — Progress Notes (Addendum)
[name], [name] ([id number]) Visit Report for 12/10/2019 Chief Complaint Document Details Patient Name: [name] Date of Service: 12/10/2019 10:15 AM Medical Record Number: [id number] Patient Account Number: [id number] Date of Birth/Sex: 17-Oct-1945 (74 y.o. M) Treating RN: Cornell Barman Primary Care Provider: [name] Other Clinician: Referring Provider: [name] Treating Provider/Extender: Melburn Hake, Samiksha Pellicano Weeks in Treatment: 1 Information Obtained from: Patient Chief Complaint Left plantar foot ulcer s/p surgical debridement and wound VAC placement. Left great toe ulcer. Electronic Signature(s) Signed: 12/10/2019 11:01:30 AM By: Worthy Keeler [name] Entered By: Worthy Keeler on 12/10/2019 11:01:29 [name] ([id number]) -------------------------------------------------------------------------------- HPI Details Patient Name: [name] Date of Service: 12/10/2019 10:15 AM Medical Record Number: [id number] Patient Account Number: [id number] Date of Birth/Sex: 1945-07-23 (74 y.o. M) Treating RN: Cornell Barman Primary Care Provider: [name] Other Clinician: Referring Provider: [name] Treating Provider/Extender: Melburn Hake, Brihana Quickel Weeks in Treatment: 1 History of Present Illness HPI Description: 10/04/16 on evaluation today patient presents for initial evaluation concerning a laceration of the right wrist and hand on the bowler aspect. He tells me that he fell into glass following a medication change in regard to his diabetes. He initially went to [facility] where she was sutured and subsequently his daughter who is a Marine scientist removed the teachers at the appropriate time. Unfortunately the wound has done poorly and dehissed which has caused him some trouble including infection. He has been on antibiotics both topically and orally though the wound continues to show signs of necrotic tissue according to notes which is what he was sent to [place]  for evaluation concerning. He does have diabetes and unfortunately this is severely uncontrolled. He does see an endocrinologist and is working on this. 10/11/2016 -- the patient's notes from the ER were reviewed and he had his suturing done on 09/17/2016 and had necrotic skin flaps which had to be trimmed the last visit he was here. He has managed to get his Santyl ointment and has been using this appropriately. 10/21/16 patient's wound appears to be doing very well on evaluation today. He still has some Slough covering the wound bed but this is nowhere near as severe as when i saw this initially two weeks ago nor even my review of his pictures last week. I'm pleased with how this is progressing. Patient's wound over the right form appears to be doing fairly well on evaluation today. There is no evidence of infection and he has a small area at the crease of where she is wrist extends and flexes that is still open and appears to be having difficulty due to the fact that it is cracking open. I think this is something that may be controlled with moisture control that is keeping the wound a little bit more moist. Nonetheless this is a tiny region compared to the initial wound and overall appears to be doing very well. No fevers, chills, nausea, or vomiting noted at this time. 11/25/16 on evaluation today patient appears to be doing well in regard to his right wrist ulcer. In fact this appears to be completely healed he is having no discomfort. He is pleased with how this is progressed. Readmission: 06/24/17 on evaluation today patient appears back in our clinic regarding issues that he has been having with his right ankle on the lateral malleolus as well is in the dorsal surface of his left foot. Both have been going on for several weeks unfortunately. He tells me that initially this was  being managed by Dr. Edilia Bo and I did review the notes from Dr. Edilia Bo in epic. Patient was placed on amoxicillin  initially and then subsequently Keflex following. He has been using triple antibiotic ointment over-the-counter along with a Band-Aid for the wound currently. He did not have any Santyl remaining. His most recent hemoglobin A1c which was just two days ago was 10.3. Fortunately he seems to have excellent blood flow in the bilateral lower extremities with his left ABI being 0.95 in the right ABI 1.01. Patient does have some pain in regard to each of these ulcerations but he tells me it's not nearly as severe as the hand wound that I help treat earlier over the summer. This was in 2018. 07/15/17 on evaluation today patient's lower surety ulcer is actually both appear to be doing fairly well at this point. He does have a little bit of skin overlapping the ulcer on the right lateral ankle this was debrided away today just with saline and gauze without complication patient had no discomfort or pain. Otherwise patient's left foot ulcer does appear to be showing signs of improvement he does seem to have a right great toe. Nikki and noted at this point in time. I think that this does need to be managed at this point. No fevers, chills, nausea, or vomiting noted at this time. 07/22/17 on evaluation today patient's wounds in regard to his lower extremities appear to be doing somewhat better. His right ankle wound in fact appears to likely be completely closed. The left dorsal foot ulcer though still open does appear to be a little bit smaller and does also seem to be making good progress. He has not been having problems with the treatment at this point in the general he does not seem to show as much erythema surrounding the wound bed which is good news. He is not having significant discomfort. 08/12/17 on evaluation today patients wounds of both appeared to be doing better in fact they both appear to be completely healed. Overall I'm very pleased with how things have progressed over the past week even more than what  I expected. Obviously this is good news and he is happy about this. He did have a fall when going to his orthopedic doctor recently fortunately he does not appear to have injured anything severely but nonetheless does have a couple of bumps and bruises nothing significant at this time but he needs to be seen and wound care for. He may have broken his left wrist he is following up with orthopedics in that regard. Readmission: 10/24/17 on evaluation today patient presents for reevaluation after having been discharged Aug 12, 2017. His wound was healed at that point although he states that it has been somewhat discolored since that time. He has not experienced the drainage as far as the wound is concerned but the fact that it was still discolored been greater than two months after the fact made him want to come in for reevaluation. Upon inspection today the patient has no pain although he does have neuropathy. He does have a discolored region of the dorsal surface of the left foot which was the site where the ulcer was that we previously treated. The good news is he does not again have any discomfort although obviously I do believe this may be some scar tissue and just discoloration in that regard. Again he hasn't had any drainage or discharge since I last saw him and at this point I see no evidence  of that either this area shows complete epithelialization. In general I do not feel like there's any significant issue at this point in that regard. 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 He has been in this clinic before in 2018 with a laceration on his right hand and then again in 2019 with an area over the right lateral malleolus. 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 He states that the wound is been there for the last week  or [name], [name]. ([id number]) 2. It is been uncomfortable to walk on. The man is an active man having to look after an elderly mother. Past medical history, type 2 diabetes with polyneuropathy with a recent hemoglobin A1c in September 2011 0.3, lumbar disc disease, hypergonadism., Left foot drop and gout. We did not repeat his ABIs in the clinic quoting values from July 2019 of 0.95 and 1.01. He is not felt to have significant PAD 02/19/2019 on evaluation today patient actually appears to be doing better with regard to his foot wound based on what I am seeing currently. He was seen by Dr. Dellia Nims on readmission back into the clinic last week when I was off. The good news is the patient has been tolerating the dressing changes without any complication in fact has not really had much in the way of drainage. He does have an AFO brace which she believes is actually pushing on this area as well and has contributed to the issue. Fortunately there is no signs of active infection at this point. His ABIs appear to be well. 03/05/2019 on evaluation today patient appears to be doing well in regard to his foot ulcer. He has been tolerating the dressing changes without complication. Fortunately there is no signs of active infection. The wound is measuring somewhat smaller and overall I feel like he is making good progress at this point. 03/12/2019 on evaluation today patient actually appears to be doing quite well with regard to his foot ulcer. The wound is looking better the measurement is not significantly better at this point unfortunately but nonetheless again the overall appearance is improving I think he is making some progress. I still think the big issue here is foot drop and again the patient wants to see if potentially he could get a brace to help with this. Subsequently I think that we can definitely make a referral to [facility] to see if they can help [place] in this regard. He is in agreement with that  plan. 03/26/2019 on evaluation today patient appears to be doing more poorly in regard to his foot ulcer at this time. He did see podiatry in order to see about getting a brace but unfortunately he tells me that Medicare is not likely to pay for one for him as he had 1 I guess 8 months ago. However this one that apparently goes in his shoes that does not seem to be working out well for him. Unfortunately there are signs of active infection at this time. No fevers, chills, nausea, vomiting, or diarrhea. Systemically. The patient has erythema on the dorsal surface of his foot that tracks from the lateral portion of his foot and again he does have a blister around the wound as well which is worse compared to previous. Overall I am concerned. 04/02/2019 on evaluation today patient appears to be  doing about the same with regard to his wound. Unfortunately there is no signs of significant improvement although I do think there is no signs of infection which is good news. His drainage is also slowed down tremendously. I think at this point he would benefit from the initiation of a total contact cast. The only concern I have is that over his balance. I explained to the patient that if we were to initiate the cast I think it would be greatly beneficial for him but he would need to ensure not to walk at all unless he uses a walker he actually has 2 walkers one for inside his home and one that he keeps out in the car. For that reason he would be covered in that regard. I just do not want him to have any accidents and fall with the cast. 12/28-Patient comes in 1 week out a day early for his appointment he was in the hospital for 2 days for uncontrolled hyperglycemia and required management of high blood sugars, with some adjustments in his home regimen of insulin, patient stubbed his right great toe today and put a Band-Aid on it, he is here for his left lateral foot ulcer and we are using silver alginate  dressing, he was intact total contact cast with difficulty with balance to the extent that that had to be discontinued 04/16/2019 on evaluation today patient appears to be doing better with regard to the overall appearance of his wound size. This is good news. We had attempted a total contact cast but unfortunately he had some falling issues that the family contacted [place] about we took the cast off we did not put it back on. Nonetheless it ended up that just a couple days later he was in the hospital due to elevations in his blood sugar which went to around 500 and even a little higher. Subsequently he was admitted to the hospital and was in the hospital for a total of 3 days and that included Christmas day. Fortunately there got his blood sugar under control he tells me that this is running about 200 now which is much better news. There does not appear to be any signs of active infection at this time. No fevers, chills, nausea, vomiting, or diarrhea. 04/30/2019 on evaluation today patient actually appears to be doing slightly worse compared to previous as far as the overall size of his wound is concerned. There also is some need for sharp debridement today as well. Fortunately there is no evidence of active infection which is good news. No fevers, chills, nausea, vomiting, or diarrhea. 05/07/2019 upon evaluation today patient appears to be doing excellent in regard to his plantar foot ulcer. He has been tolerating the dressing changes without complication. Fortunately there is no signs of active infection at this time. No fevers, chills, nausea, vomiting, or diarrhea. He does tell me that he actually got in touch with [employer] and they feel like they can add diagnosis codes to his orders in order to get an external brace for him as opposed to the one that goes internal to issue. That would be excellent for his foot drop and I did tell him that if there is anything I need to fill out in that regard I will be  more than happy to oblige and fill out what is needed for [employer]. 05/14/2019 upon evaluation today patient's original wound site actually appears to be doing much better which is good news. Fortunately there is no signs of  active infection at this time. No fevers, chills, nausea, vomiting, or diarrhea. With that being said the patient unfortunately is continuing to have issues with rubbing he tells me that the felt slid on his foot although he did not realize it until today. Nonetheless I think this did cause a blister around the edge of the wound and he does an open wound that is slightly larger overall in measurement compared to last time mainly due to this blister. Other than that I feel like he is doing quite well. 05/21/2019 upon evaluation today patient appears to be doing excellent in regard to his wound at this time on the plantar foot. He is showing signs of improvement he does have some slough and biofilm noted on the surface of the wound this is going require some sharp debridement today as well. Fortunately there is no evidence of active infection. No fevers, chills, nausea, vomiting, or diarrhea. 05/28/2019 on evaluation today patient's wound actually appears to be measuring smaller upon initial inspection and I do believe that it is doing well. With that being said he continues to have a blister on the outside/lateral edge of the wound that occurs as a result of rubbing due to his dropfoot currently. Subsequently we have filled out the paperwork for his new external brace but again that does take several weeks to get that completed once everything is returned to back we did send this back last week as far as the paperwork was concerned. With that being said there does not appear to be any signs of active infection at this time. 06/04/2019 on evaluation today patient's wound actually appears to be doing slightly better compared to last week which is good news. He still states that his  insurance unfortunately does not seem to want to cover any of his new brace. He is going to just pay for this himself. Nonetheless he is going by there today when he leaves here that is [employer] in [place] that is going to be making this brace for him. 06/18/2019 upon evaluation today patient fortunately does have his new brace which is the external AFO brace with bilateral uprights. Subsequently he seems to be doing much better in fact the wound already appears better. He has not even been using the felt he is just using a dressing over [place] ([id number]) top of the wound and overall I am very pleased with how things seem to be progressing. There is no signs of active infection at this time which is great news and I think that the AFO is good to help him tremendously as far as trying to keep pressure off of the 06/25/2019 upon evaluation today patient seems to be making good progress in regard to his wound. He has been tolerating the dressing changes without complication. Fortunately there is no signs of active infection at this time. No fevers, chills, nausea, vomiting, or diarrhea. 07/02/2019 upon evaluation today patient unfortunately had a blistered area on the bottom of his foot tracking more towards the midline of the plantar aspect. He does not seem to have issues with rubbing more laterally that he used to have but nonetheless this is still giving him some trouble and causing problems here. Fortunately there is no signs of active infection at this time. No fevers, chills, nausea, vomiting, or diarrhea. 07/09/2019 upon evaluation today patient appears to be doing well with regard to his plantar foot ulcer. Definitely this is much improved compared to last week's visit which again last  week the issue there was that he had an area of callus buildup on the plantar aspect of his foot I was able to clean this away and after effectively doing so the wound seems to be doing much better which  is great news. There is no signs of active infection at this time. 07/16/2019 upon evaluation today patient appears to be making some progress in my opinion with regard to his plantar foot ulcer. He has been tolerating the dressing changes without complication. Fortunately there is no evidence of active infection at this time which is great news. 07/23/2019 upon evaluation today patient appears to be doing well with regard to his wounds on the plantar foot. He has been tolerating the dressing changes without complication. These do seem to be healing. 08/06/2019 upon evaluation today patient's wound actually appears to be showing some signs of improvement though he still has a significant amount of callus buildup over this location. That is can require some debridement today. With that being said the actual wound openings seem to be dramatically improved which is great news. There is no signs of active infection at this time. No fevers, chills, nausea, vomiting, or diarrhea. 08/13/2019 upon evaluation today patient appears to be doing well with regard to his foot ulcer. He has been tolerating the dressing changes without complication. Fortunately there is no signs of active infection at this time. No fevers, chills, nausea, vomiting, or diarrhea. 08/20/2019 upon evaluation today patient appears to be doing excellent in regard to his foot ulcer. I do believe he still showing signs of improvement this is very slow but again I think still pressure getting to the area even with his brace and custom shoe is still part of the main issue with the delay here. Nonetheless I do not see any signs of worsening or infection all of which is good news. 5/20; left lateral foot wound. He wears an AFO brace for foot drop related to his diabetes. The wound is small and clean looking he is using silver collagen. We have apparently tried him in offloading shoe he could not tolerate this. He does not have good balance. 09/11/2019  upon evaluation today patient appears to be doing more poorly in regard to his foot ulcer as compared to last time I saw him. Unfortunately he does tell me as we discussed on Friday that he had pus and blood coming from the area underneath the callus. Unfortunately I think that this is a combination of the callus not being trimmed away enough and potentially the felt pad causing an abnormal rubbing which has happened before which led to this becoming more irritated and likely infected based on what I am seeing today. The patient takes ampicillin all the time. Subsequently he also states that he has been trying for a couple of days to take the clindamycin of which she had some leftover from previous. With that being said it upset his stomach. The antibiotic that I sent in for him on Friday unfortunately I wrote the wrong number on as far as the number of pills that he needed. With that being said I corrected that this morning but he tells me is not can pick it up as that seems to be making him have trouble with his stomach being upset at this point. Fortunately there is no signs of active infection at this time systemically. 09/17/2019 upon evaluation today patient appears to be doing better in regard to his foot ulcer. Has been taking ampicillin that  he had leftover from a previous prescription. With that being said he did go see his doctor today concerning his diabetes and notes that his hemoglobin A1c was 13 obviously this is elevated and there can be working on that. Also did review the culture result that we finally got back for final well that showed rare Pseudomonas and a few group B strep noted. The Pseudomonas was sensitive to Cipro but again the wound is doing so much better with the ampicillin I think this is probably not a causative organism I do not see anything that really has me concerned as far as infection is concerned today. Therefore I would hold off on prescribing Cipro for the patient  and have him discontinue the ampicillin until completion. 09/24/2019 upon evaluation today patient appears to be doing 09/24/2019 upon evaluation today patient appears to be doing well with regard to his foot ulcer. He actually has not enlarged any and seems to be measuring smaller quite significantly at this point. His toe ulcer is also doing somewhat better which is good news. Fortunately there is no evidence of active infection at this time. No fevers, chills, nausea, vomiting, or diarrhea. 10/08/2019 upon evaluation today patient appears to be doing well with regard to his foot ulcer all things considered. Fortunately there is no signs of active infection at this time. No fevers, chills, nausea, vomiting, or diarrhea. Fortunately he states that overall he has been doing much better with regard to not have any blistering on the side of his foot. Unfortunately he did have a fall last night due to the fact that he got up to walk without his brace on and did not have his walker either it was around 4 in the morning and his mother was calling out. Subsequently he states that the foot drop got him and he ended up following. Nonetheless he states he normally uses his walker I explained he needs to try to make sure not to ever go without the walker. No matter how attempting the situation may be. Readmission: 12/03/2019 upon evaluation today patient presents for follow-up after having had surgery last week on his foot for surgical debridement and then subsequent wound VAC placement. He had gone into the hospital as he was having a significant infection and not feeling well. He is unfortunately since I last saw him also been under the care of the cancer doctors and they have been taking good care of him. He does seem to be in fairly high spirits all things considered. With that being said he has lung cancer he tells me that his back from the radiation feels like it is constantly burned. 12/10/2019 on evaluation  today patient appears to be doing well with regard to his wound. I do believe the wound VAC is doing a great job at helping to manage his drainage and overall health and to granulate in the wound. He has less undermining noted today in fact almost none. Home health is coming out to change the wound VAC on a regular basis Monday, Wednesday, and Friday. Electronic Signature(s) Signed: 12/10/2019 4:38:38 PM By: Worthy Keeler [name] Entered By: Worthy Keeler on 12/10/2019 16:38:38 [name] ([id number]) -------------------------------------------------------------------------------- Physical Exam Details Patient Name: [name] Date of Service: 12/10/2019 10:15 AM Medical Record Number: [id number] Patient Account Number: [id number] Date of Birth/Sex: 1945-12-12 (74 y.o. M) Treating RN: Cornell Barman Primary Care Provider: [name] Other Clinician: Referring Provider: [name] Treating Provider/Extender: [name], Towana Stenglein Weeks in  Treatment: 1 Constitutional Well-nourished and well-hydrated in no acute distress. Respiratory normal breathing without difficulty. Psychiatric this patient is able to make decisions and demonstrates good insight into disease process. Alert and Oriented x 3. pleasant and cooperative. Notes Patient's wound bed actually showed signs of good granulation at this time. There does not appear to be any evidence of active infection no slough buildup but no sharp debridement was necessary. Overall I feel like that he is progressing quite nicely with the wound VAC. Electronic Signature(s) Signed: 12/10/2019 4:39:03 PM By: Worthy Keeler [name] Entered By: Worthy Keeler on 12/10/2019 16:39:03 [name] ([id number]) -------------------------------------------------------------------------------- Physician Orders Details Patient Name: [name] Date of Service: 12/10/2019 10:15 AM Medical Record Number: [id number] Patient Account  Number: [id number] Date of Birth/Sex: [birth year] (74 y.o. 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 Wound #8 Left,Distal Toe Great o Clean wound with Normal Saline. Anesthetic (add to Medication List) Wound #7 Left,Lateral,Dorsal Foot o Topical Lidocaine 4% cream applied to wound bed prior to debridement (In Clinic Only). Wound #8 Left,Distal Toe Great o Topical Lidocaine 4% cream applied to wound bed prior to debridement (In Clinic Only). Skin Barriers/Peri-Wound Care Wound #7 Left,Lateral,Dorsal Foot o Skin Prep Wound #8 Left,Distal Toe Great o Skin Prep Primary Wound Dressing Wound #7 Left,Lateral,Dorsal Foot o Silver Collagen Wound #8 Left,Distal Toe Great o Other: - Betadine Secondary Dressing Wound #8 Left,Distal Toe Great o Other - coverlet Dressing Change Frequency Wound #7 Left,Lateral,Dorsal Foot o Change Dressing Monday, Wednesday, Friday Wound #8 Left,Distal Toe Great o Change Dressing Monday, Wednesday, Friday Follow-up Appointments Wound #7 Left,Lateral,Dorsal Foot o Return Appointment in 1 week. o Nurse Visit as needed Wound #8 [address] [place]. ([id number]) o Return Appointment in 2 weeks. o Nurse Visit as needed Edema Control Wound #7 Left,Lateral,Dorsal Foot o Elevate legs to the level of the heart and pump ankles as often as possible Off-Loading Wound #7  Left,Lateral,Dorsal Foot o Other: - No weight on foot, Wheelchair only Additional Orders / Instructions Wound #7 Left,Lateral,Dorsal Foot o Increase protein intake. Wound #8 Left,Distal Toe Great o Increase protein intake. Home Health Wound #7 [place] Visits - [place] Nurse may visit PRN to address patientos wound care needs. o FACE TO FACE ENCOUNTER: MEDICARE and MEDICAID PATIENTS: I certify that this patient is under my care and that I had a face-to-face encounter that meets the physician face-to-face encounter requirements with this patient on this date. The encounter with the patient was in whole or in part for the following MEDICAL CONDITION: (primary reason for [place]) MEDICAL NECESSITY: I certify, that based on my findings, NURSING services are a medically necessary home health service. HOME BOUND STATUS: I certify that my clinical findings support that this patient is homebound (i.e., Due to illness or injury, pt requires aid of supportive devices such as crutches, cane, wheelchairs, walkers, the use of special transportation or the assistance of another person to leave their place of residence. There is a normal  inability to leave the home and doing so requires considerable and taxing effort. Other absences are for medical reasons / religious services and are infrequent or of short duration when for other reasons). o If current dressing causes regression in wound condition, may D/C ordered dressing product/s and apply Normal Saline Moist Dressing daily until next [place] / Other MD appointment. [place] of regression in wound condition at [phone]. o Please direct any NON-WOUND related issues/requests for orders to patient's Primary Care Physician Wound #8 Left,Distal Toe [place] Visits - [place] Nurse may visit PRN to address  patientos wound care needs. o FACE TO FACE ENCOUNTER: MEDICARE and MEDICAID PATIENTS: I certify that this patient is under my care and that I had a face-to-face encounter that meets the physician face-to-face encounter requirements with this patient on this date. The encounter with the patient was in whole or in part for the following MEDICAL CONDITION: (primary reason for [place]) MEDICAL NECESSITY: I certify, that based on my findings, NURSING services are a medically necessary home health service. HOME BOUND STATUS: I certify that my clinical findings support that this patient is homebound (i.e., Due to illness or injury, pt requires aid of supportive devices such as crutches, cane, wheelchairs, walkers, the use of special transportation or the assistance of another person to leave their place of residence. There is a normal inability to leave the home and doing so requires considerable and taxing effort. Other absences are for medical reasons / religious services and are infrequent or of short duration when for other reasons). o If current dressing causes regression in wound condition, may D/C ordered dressing product/s and apply Normal Saline Moist Dressing daily until next [place] / Other MD appointment. [place] of regression in wound condition at [phone]. o Please direct any NON-WOUND related issues/requests for orders to patient's Primary Care Physician Negative Pressure Wound Therapy Wound #7 Left,Lateral,Dorsal Foot o Wound VAC settings at 125/130 mmHg continuous pressure. Use BLACK/GREEN foam to wound cavity. Use WHITE foam to fill any tunnel/s and/or undermining. Change VAC dressing 3 X WEEK. Change canister as indicated when full. Nurse may titrate settings and frequency of dressing changes as clinically indicated. - Collagen with silver under sponge, window pane wound with duoderm before applying NPWT dressing o Home Health  Nurse may d/c VAC for s/s of increased infection, significant wound regression, or uncontrolled drainage. [place] at [phone]. o Apply contact layer over base of wound. o Number of foam/gauze pieces used in the dressing = Notes apply WTD dressing today. HH to apply wound vac on todays visit. Electronic Signature(s) Signed: 12/10/2019 5:20:53 PM By: Milus Height, Grier Rocher ([id number]) Signed: 12/10/2019 5:36:54 PM By: Worthy Keeler [name] Entered By: Grover Canavan on 12/10/2019 11:10:36 [name] ([id number]) -------------------------------------------------------------------------------- Problem List Details Patient Name: [name] Date of Service: 12/10/2019 10:15 AM Medical Record Number: [id number] Patient Account Number: [id number] Date of Birth/Sex: Jul 13, 1945 (74 y.o. 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 12/03/2019 No Yes muscle E11.42 Type 2 diabetes mellitus with diabetic polyneuropathy 12/03/2019 No Yes M21.372 Foot drop, left  foot 12/03/2019 No Yes Inactive Problems Resolved Problems Electronic Signature(s) Signed: 12/10/2019 11:01:24 AM By: Worthy Keeler [name] Entered By: Worthy Keeler on 12/10/2019 11:01:23 [name] ([id number]) -------------------------------------------------------------------------------- Progress Note Details Patient Name: [name] Date of Service: 12/10/2019 10:15 AM Medical Record Number: [id number] Patient Account Number: [id number] Date of Birth/Sex: [birth year] (74 y.o. M) Treating RN: Cornell Barman Primary  Care Provider: [name] Other Clinician: Referring Provider: [name] Treating Provider/Extender: Melburn Hake, Chigozie Basaldua Weeks in Treatment: 1 Subjective Chief Complaint Information obtained from Patient Left plantar foot ulcer s/p surgical debridement and wound VAC placement. Left great toe ulcer. History of Present Illness (HPI) 10/04/16 on evaluation today patient presents for initial evaluation concerning a laceration of the right wrist and hand on the bowler aspect. He tells me that he fell into glass following a medication change in regard to his diabetes. He initially went to [facility] ER where she was sutured and subsequently his daughter who is a Marine scientist removed the teachers at the appropriate time. Unfortunately the wound has done poorly and dehissed which has caused him some trouble including infection. He has been on antibiotics both topically and orally though the wound continues to show signs of necrotic tissue according to notes which is what he was sent to [place] for evaluation concerning. He does have diabetes and unfortunately this is severely uncontrolled. He does see an endocrinologist and is working on this. 10/11/2016 -- the patient's notes from the ER were reviewed and he had his suturing done on 09/17/2016 and had necrotic skin flaps which had to be trimmed the last visit he was here. He has managed to get his Santyl ointment and has been using this appropriately. 10/21/16 patient's wound appears to be doing very well on evaluation today. He still has some Slough covering the wound bed but this is nowhere near as severe as when i saw this initially two weeks ago nor even my review of his pictures last week. I'm pleased with how this is progressing. Patient's wound over the right form appears to be doing fairly well on evaluation today. There is no evidence of infection and he has a small area at the crease of where she is wrist extends and flexes that is still open and  appears to be having difficulty due to the fact that it is cracking open. I think this is something that may be controlled with moisture control that is keeping the wound a little bit more moist. Nonetheless this is a tiny region compared to the initial wound and overall appears to be doing very well. No fevers, chills, nausea, or vomiting noted at this time. 11/25/16 on evaluation today patient appears to be doing well in regard to his right wrist ulcer. In fact this appears to be completely healed he is having no discomfort. He is pleased with how this is progressed. Readmission: 06/24/17 on evaluation today patient appears back in our clinic regarding issues that he has been having with his right ankle on the lateral malleolus as well is in the dorsal surface of his left foot. Both have been going on for several weeks unfortunately. He tells me that initially this was being managed by Dr. Edilia Bo and I did review the notes from Dr. Edilia Bo in epic. Patient was placed on amoxicillin initially and then subsequently Keflex following. He has been using triple antibiotic ointment over-the-counter along with a Band-Aid for the wound currently. He did not have any Santyl remaining. His most  recent hemoglobin A1c which was just two days ago was 10.3. Fortunately he seems to have excellent blood flow in the bilateral lower extremities with his left ABI being 0.95 in the right ABI 1.01. Patient does have some pain in regard to each of these ulcerations but he tells me it's not nearly as severe as the hand wound that I help treat earlier over the summer. This was in 2018. 07/15/17 on evaluation today patient's lower surety ulcer is actually both appear to be doing fairly well at this point. He does have a little bit of skin overlapping the ulcer on the right lateral ankle this was debrided away today just with saline and gauze without complication patient had no discomfort or pain. Otherwise patient's left foot  ulcer does appear to be showing signs of improvement he does seem to have a right great toe. Nikki and noted at this point in time. I think that this does need to be managed at this point. No fevers, chills, nausea, or vomiting noted at this time. 07/22/17 on evaluation today patient's wounds in regard to his lower extremities appear to be doing somewhat better. His right ankle wound in fact appears to likely be completely closed. The left dorsal foot ulcer though still open does appear to be a little bit smaller and does also seem to be making good progress. He has not been having problems with the treatment at this point in the general he does not seem to show as much erythema surrounding the wound bed which is good news. He is not having significant discomfort. 08/12/17 on evaluation today patients wounds of both appeared to be doing better in fact they both appear to be completely healed. Overall I'm very pleased with how things have progressed over the past week even more than what I expected. Obviously this is good news and he is happy about this. He did have a fall when going to his orthopedic doctor recently fortunately he does not appear to have injured anything severely but nonetheless does have a couple of bumps and bruises nothing significant at this time but he needs to be seen and wound care for. He may have broken his left wrist he is following up with orthopedics in that regard. Readmission: 10/24/17 on evaluation today patient presents for reevaluation after having been discharged Aug 12, 2017. His wound was healed at that point although he states that it has been somewhat discolored since that time. He has not experienced the drainage as far as the wound is concerned but the fact that it was still discolored been greater than two months after the fact made him want to come in for reevaluation. Upon inspection today the patient has no pain although he does have neuropathy. He does have a  discolored region of the dorsal surface of the left foot which was the site where the ulcer was that we previously treated. The good news is he does not again have any discomfort although obviously I do believe this may be some scar tissue and just discoloration in that regard. Again he hasn't had any drainage or discharge since I last saw him and at this point I see no evidence of that either this area shows complete epithelialization. In general I do not feel like there's any significant issue at this point in that regard. [name], [name] ([id number]) 02/09/2019 This is a 74 year old man with type 2 diabetes poorly controlled with significant peripheral neuropathy. He has been in this clinic  before in 2018 with a laceration on his right hand and then again in 2019 with an area over the right lateral malleolus. 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 He states that the wound is been there for the last week or 2. It is been uncomfortable to walk on. The man is an active man having to look after an elderly mother. Past medical history, type 2 diabetes with polyneuropathy with a recent hemoglobin A1c in September 2011 0.3, lumbar disc disease, hypergonadism., Left foot drop and gout. We did not repeat his ABIs in the clinic quoting values from July 2019 of 0.95 and 1.01. He is not felt to have significant PAD 02/19/2019 on evaluation today patient actually appears to be doing better with regard to his foot wound based on what I am seeing currently. He was seen by Dr. Dellia Nims on readmission back into the clinic last week when I was off. The good news is the patient has been tolerating the dressing changes without any complication in fact has not really had much in the way of drainage. He does have an AFO brace which she believes is actually pushing on this area as well and has contributed to the issue.  Fortunately there is no signs of active infection at this point. His ABIs appear to be well. 03/05/2019 on evaluation today patient appears to be doing well in regard to his foot ulcer. He has been tolerating the dressing changes without complication. Fortunately there is no signs of active infection. The wound is measuring somewhat smaller and overall I feel like he is making good progress at this point. 03/12/2019 on evaluation today patient actually appears to be doing quite well with regard to his foot ulcer. The wound is looking better the measurement is not significantly better at this point unfortunately but nonetheless again the overall appearance is improving I think he is making some progress. I still think the big issue here is foot drop and again the patient wants to see if potentially he could get a brace to help with this. Subsequently I think that we can definitely make a referral to [facility] to see if they can help [place] in this regard. He is in agreement with that plan. 03/26/2019 on evaluation today patient appears to be doing more poorly in regard to his foot ulcer at this time. He did see podiatry in order to see about getting a brace but unfortunately he tells me that Medicare is not likely to pay for one for him as he had 1 I guess 8 months ago. However this one that apparently goes in his shoes that does not seem to be working out well for him. Unfortunately there are signs of active infection at this time. No fevers, chills, nausea, vomiting, or diarrhea. Systemically. The patient has erythema on the dorsal surface of his foot that tracks from the lateral portion of his foot and again he does have a blister around the wound as well which is worse compared to previous. Overall I am concerned. 04/02/2019 on evaluation today patient appears to be doing about the same with regard to his wound. Unfortunately there is no signs of significant improvement although I do think there is  no signs of infection which is good news. His drainage is also slowed down tremendously. I think at this point he would benefit from the initiation of a total  contact cast. The only concern I have is that over his balance. I explained to the patient that if we were to initiate the cast I think it would be greatly beneficial for him but he would need to ensure not to walk at all unless he uses a walker he actually has 2 walkers one for inside his home and one that he keeps out in the car. For that reason he would be covered in that regard. I just do not want him to have any accidents and fall with the cast. 12/28-Patient comes in 1 week out a day early for his appointment he was in the hospital for 2 days for uncontrolled hyperglycemia and required management of high blood sugars, with some adjustments in his home regimen of insulin, patient stubbed his right great toe today and put a Band-Aid on it, he is here for his left lateral foot ulcer and we are using silver alginate dressing, he was intact total contact cast with difficulty with balance to the extent that that had to be discontinued 04/16/2019 on evaluation today patient appears to be doing better with regard to the overall appearance of his wound size. This is good news. We had attempted a total contact cast but unfortunately he had some falling issues that the family contacted [place] about we took the cast off we did not put it back on. Nonetheless it ended up that just a couple days later he was in the hospital due to elevations in his blood sugar which went to around 500 and even a little higher. Subsequently he was admitted to the hospital and was in the hospital for a total of 3 days and that included Christmas day. Fortunately there got his blood sugar under control he tells me that this is running about 200 now which is much better news. There does not appear to be any signs of active infection at this time. No fevers, chills, nausea,  vomiting, or diarrhea. 04/30/2019 on evaluation today patient actually appears to be doing slightly worse compared to previous as far as the overall size of his wound is concerned. There also is some need for sharp debridement today as well. Fortunately there is no evidence of active infection which is good news. No fevers, chills, nausea, vomiting, or diarrhea. 05/07/2019 upon evaluation today patient appears to be doing excellent in regard to his plantar foot ulcer. He has been tolerating the dressing changes without complication. Fortunately there is no signs of active infection at this time. No fevers, chills, nausea, vomiting, or diarrhea. He does tell me that he actually got in touch with [employer] and they feel like they can add diagnosis codes to his orders in order to get an external brace for him as opposed to the one that goes internal to issue. That would be excellent for his foot drop and I did tell him that if there is anything I need to fill out in that regard I will be more than happy to oblige and fill out what is needed for [employer]. 05/14/2019 upon evaluation today patient's original wound site actually appears to be doing much better which is good news. Fortunately there is no signs of active infection at this time. No fevers, chills, nausea, vomiting, or diarrhea. With that being said the patient unfortunately is continuing to have issues with rubbing he tells me that the felt slid on his foot although he did not realize it until today. Nonetheless I think this did cause a blister around  the edge of the wound and he does an open wound that is slightly larger overall in measurement compared to last time mainly due to this blister. Other than that I feel like he is doing quite well. 05/21/2019 upon evaluation today patient appears to be doing excellent in regard to his wound at this time on the plantar foot. He is showing signs of improvement he does have some slough and biofilm noted on the  surface of the wound this is going require some sharp debridement today as well. Fortunately there is no evidence of active infection. No fevers, chills, nausea, vomiting, or diarrhea. 05/28/2019 on evaluation today patient's wound actually appears to be measuring smaller upon initial inspection and I do believe that it is doing well. With that being said he continues to have a blister on the outside/lateral edge of the wound that occurs as a result of rubbing due to his dropfoot currently. Subsequently we have filled out the paperwork for his new external brace but again that does take several weeks to get that completed once everything is returned to back we did send this back last week as far as the paperwork was concerned. With that being said there does not appear to be any signs of active infection at this time. 06/04/2019 on evaluation today patient's wound actually appears to be doing slightly better compared to last week which is good news. He still [name], [name] ([id number]) states that his insurance unfortunately does not seem to want to cover any of his new brace. He is going to just pay for this himself. Nonetheless he is going by there today when he leaves here that is [employer] in [place] that is going to be making this brace for him. 06/18/2019 upon evaluation today patient fortunately does have his new brace which is the external AFO brace with bilateral uprights. Subsequently he seems to be doing much better in fact the wound already appears better. He has not even been using the felt he is just using a dressing over top of the wound and overall I am very pleased with how things seem to be progressing. There is no signs of active infection at this time which is great news and I think that the AFO is good to help him tremendously as far as trying to keep pressure off of the 06/25/2019 upon evaluation today patient seems to be making good progress in regard to his wound. He has been  tolerating the dressing changes without complication. Fortunately there is no signs of active infection at this time. No fevers, chills, nausea, vomiting, or diarrhea. 07/02/2019 upon evaluation today patient unfortunately had a blistered area on the bottom of his foot tracking more towards the midline of the plantar aspect. He does not seem to have issues with rubbing more laterally that he used to have but nonetheless this is still giving him some trouble and causing problems here. Fortunately there is no signs of active infection at this time. No fevers, chills, nausea, vomiting, or diarrhea. 07/09/2019 upon evaluation today patient appears to be doing well with regard to his plantar foot ulcer. Definitely this is much improved compared to last week's visit which again last week the issue there was that he had an area of callus buildup on the plantar aspect of his foot I was able to clean this away and after effectively doing so the wound seems to be doing much better which is great news. There is no signs of active infection at  this time. 07/16/2019 upon evaluation today patient appears to be making some progress in my opinion with regard to his plantar foot ulcer. He has been tolerating the dressing changes without complication. Fortunately there is no evidence of active infection at this time which is great news. 07/23/2019 upon evaluation today patient appears to be doing well with regard to his wounds on the plantar foot. He has been tolerating the dressing changes without complication. These do seem to be healing. 08/06/2019 upon evaluation today patient's wound actually appears to be showing some signs of improvement though he still has a significant amount of callus buildup over this location. That is can require some debridement today. With that being said the actual wound openings seem to be dramatically improved which is great news. There is no signs of active infection at this time. No  fevers, chills, nausea, vomiting, or diarrhea. 08/13/2019 upon evaluation today patient appears to be doing well with regard to his foot ulcer. He has been tolerating the dressing changes without complication. Fortunately there is no signs of active infection at this time. No fevers, chills, nausea, vomiting, or diarrhea. 08/20/2019 upon evaluation today patient appears to be doing excellent in regard to his foot ulcer. I do believe he still showing signs of improvement this is very slow but again I think still pressure getting to the area even with his brace and custom shoe is still part of the main issue with the delay here. Nonetheless I do not see any signs of worsening or infection all of which is good news. 5/20; left lateral foot wound. He wears an AFO brace for foot drop related to his diabetes. The wound is small and clean looking he is using silver collagen. We have apparently tried him in offloading shoe he could not tolerate this. He does not have good balance. 09/11/2019 upon evaluation today patient appears to be doing more poorly in regard to his foot ulcer as compared to last time I saw him. Unfortunately he does tell me as we discussed on Friday that he had pus and blood coming from the area underneath the callus. Unfortunately I think that this is a combination of the callus not being trimmed away enough and potentially the felt pad causing an abnormal rubbing which has happened before which led to this becoming more irritated and likely infected based on what I am seeing today. The patient takes ampicillin all the time. Subsequently he also states that he has been trying for a couple of days to take the clindamycin of which she had some leftover from previous. With that being said it upset his stomach. The antibiotic that I sent in for him on Friday unfortunately I wrote the wrong number on as far as the number of pills that he needed. With that being said I corrected that this morning  but he tells me is not can pick it up as that seems to be making him have trouble with his stomach being upset at this point. Fortunately there is no signs of active infection at this time systemically. 09/17/2019 upon evaluation today patient appears to be doing better in regard to his foot ulcer. Has been taking ampicillin that he had leftover from a previous prescription. With that being said he did go see his doctor today concerning his diabetes and notes that his hemoglobin A1c was 13 obviously this is elevated and there can be working on that. Also did review the culture result that we finally got back for  final well that showed rare Pseudomonas and a few group B strep noted. The Pseudomonas was sensitive to Cipro but again the wound is doing so much better with the ampicillin I think this is probably not a causative organism I do not see anything that really has me concerned as far as infection is concerned today. Therefore I would hold off on prescribing Cipro for the patient and have him discontinue the ampicillin until completion. 09/24/2019 upon evaluation today patient appears to be doing 09/24/2019 upon evaluation today patient appears to be doing well with regard to his foot ulcer. He actually has not enlarged any and seems to be measuring smaller quite significantly at this point. His toe ulcer is also doing somewhat better which is good news. Fortunately there is no evidence of active infection at this time. No fevers, chills, nausea, vomiting, or diarrhea. 10/08/2019 upon evaluation today patient appears to be doing well with regard to his foot ulcer all things considered. Fortunately there is no signs of active infection at this time. No fevers, chills, nausea, vomiting, or diarrhea. Fortunately he states that overall he has been doing much better with regard to not have any blistering on the side of his foot. Unfortunately he did have a fall last night due to the fact that he got up to  walk without his brace on and did not have his walker either it was around 4 in the morning and his mother was calling out. Subsequently he states that the foot drop got him and he ended up following. Nonetheless he states he normally uses his walker I explained he needs to try to make sure not to ever go without the walker. No matter how attempting the situation may be. Readmission: 12/03/2019 upon evaluation today patient presents for follow-up after having had surgery last week on his foot for surgical debridement and then subsequent wound VAC placement. He had gone into the hospital as he was having a significant infection and not feeling well. He is unfortunately since I last saw him also been under the care of the cancer doctors and they have been taking good care of him. He does seem to be in fairly high spirits all things considered. With that being said he has lung cancer he tells me that his back from the radiation feels like it is constantly burned. 12/10/2019 on evaluation today patient appears to be doing well with regard to his wound. I do believe the wound VAC is doing a great job at helping to manage his drainage and overall health and to granulate in the wound. He has less undermining noted today in fact almost none. Home health is coming out to change the wound VAC on a regular basis Monday, Wednesday, and Friday. [name], [name] ([id number]) Objective Constitutional Well-nourished and well-hydrated in no acute distress. Vitals Time Taken: 10:20 AM, Height: 71 in, Weight: 190 lbs, BMI: 26.5, Temperature: 97.7 F, Pulse: 90 bpm, Respiratory Rate: 18 breaths/min, Blood Pressure: 138/70 mmHg. Respiratory normal breathing without difficulty. Psychiatric this patient is able to make decisions and demonstrates good insight into disease process. Alert and Oriented x 3. pleasant and cooperative. General Notes: Patient's wound bed actually showed signs of good granulation at this time.  There does not appear to be any evidence of active infection no slough buildup but no sharp debridement was necessary. Overall I feel like that he is progressing quite nicely with the wound VAC. Integumentary (Hair, Skin) Wound #7 status is Open. Original cause of  wound was Surgical Injury. The wound is located on the Left,Lateral,Dorsal Foot. The wound measures 2.4cm length x 3.6cm width x 0.5cm depth; 6.786cm^2 area and 3.393cm^3 volume. There is Fat Layer (Subcutaneous Tissue) exposed. There is a medium amount of serosanguineous drainage noted. The wound margin is flat and intact. There is small (1-33%) pink granulation within the wound bed. There is a large (67-100%) amount of necrotic tissue within the wound bed including Adherent Slough. Wound #8 status is Open. Original cause of wound was Pressure Injury. The wound is located on the [employer]. The wound measures 0.5cm length x 0.7cm width x 0.1cm depth; 0.275cm^2 area and 0.027cm^3 volume. 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 Wound #8 Left,Distal Toe Great: Clean wound with Normal Saline. Anesthetic (add to Medication List): Wound #7 Left,Lateral,Dorsal Foot: Topical Lidocaine 4% cream applied to wound bed prior to debridement (In Clinic Only). Wound #8 Left,Distal Toe Great: Topical Lidocaine 4% cream applied to wound bed prior to debridement (In Clinic Only). Skin Barriers/Peri-Wound Care: Wound #7 Left,Lateral,Dorsal Foot: Skin Prep Wound #8 Left,Distal Toe Great: Skin Prep Primary Wound Dressing: Wound #7 Left,Lateral,Dorsal Foot: Silver Collagen Wound #8 Left,Distal Toe Great: Other: - Betadine Secondary Dressing: [name], [name] ([id number]) Wound #8 Left,Distal Toe Great: Other - coverlet Dressing Change Frequency: Wound #7 Left,Lateral,Dorsal Foot: Change Dressing Monday, Wednesday, Friday Wound #8 Left,Distal Toe Great: Change Dressing Monday, Wednesday, Friday Follow-up Appointments: Wound #7 Left,Lateral,Dorsal Foot: Return Appointment in 1 week. Nurse Visit as needed Wound #8 Left,Distal Toe Great: Return Appointment in 2 weeks. Nurse Visit as needed Edema Control: Wound #7 Left,Lateral,Dorsal Foot: Elevate legs to the level of the heart and pump ankles as often as possible Off-Loading: Wound #7 Left,Lateral,Dorsal Foot: Other: - No weight on foot, Wheelchair only Additional Orders / Instructions: Wound #7 Left,Lateral,Dorsal Foot: Increase protein intake. Wound #8 Left,Distal Toe Great: Increase protein intake. Home Health: Wound #7 Left,Lateral,Dorsal Foot: Continue Home Health Visits - [place] Nurse may visit PRN to address patient s wound care needs. FACE TO FACE ENCOUNTER: MEDICARE and MEDICAID PATIENTS: I certify that this patient is under my care and that I had a face-to-face encounter that meets the physician face-to-face encounter requirements with this patient on this date. The encounter with the patient was in whole or in part for the following MEDICAL CONDITION: (primary reason for [place]) MEDICAL NECESSITY: I certify, that based on my findings, NURSING services are a medically necessary home health service. HOME BOUND STATUS: I certify that my clinical findings support that this patient is homebound (i.e., Due to illness or injury, pt requires aid of supportive devices such as crutches, cane, wheelchairs, walkers, the use of special transportation or the assistance of another person to leave their place of residence. There is a normal inability to leave the home and doing so requires considerable and taxing effort. Other absences are for medical reasons /  religious services and are infrequent or of short duration when for other reasons). If current dressing causes regression in wound condition, may D/C ordered dressing product/s and apply Normal Saline Moist Dressing daily until next [place] / Other MD appointment. [place] of regression in wound condition at [phone]. Please direct any NON-WOUND related issues/requests  for orders to patient's Primary Care Physician Wound #8 Left,Distal Toe Great: [place] Visits - [place] Nurse may visit PRN to address patient s wound care needs. FACE TO FACE ENCOUNTER: MEDICARE and MEDICAID PATIENTS: I certify that this patient is under my care and that I had a face-to-face encounter that meets the physician face-to-face encounter requirements with this patient on this date. The encounter with the patient was in whole or in part for the following MEDICAL CONDITION: (primary reason for [place]) MEDICAL NECESSITY: I certify, that based on my findings, NURSING services are a medically necessary home health service. HOME BOUND STATUS: I certify that my clinical findings support that this patient is homebound (i.e., Due to illness or injury, pt requires aid of supportive devices such as crutches, cane, wheelchairs, walkers, the use of special transportation or the assistance of another person to leave their place of residence. There is a normal inability to leave the home and doing so requires considerable and taxing effort. Other absences are for medical reasons / religious services and are infrequent or of short duration when for other reasons). If current dressing causes regression in wound condition, may D/C ordered dressing product/s and apply Normal Saline Moist Dressing daily until next [place] / Other MD appointment. [place] of regression in wound condition at [phone]. Please direct any NON-WOUND related  issues/requests for orders to patient's Primary Care Physician Negative Pressure Wound Therapy: Wound #7 Left,Lateral,Dorsal Foot: Wound VAC settings at 125/130 mmHg continuous pressure. Use BLACK/GREEN foam to wound cavity. Use WHITE foam to fill any tunnel/s and/or undermining. Change VAC dressing 3 X WEEK. Change canister as indicated when full. Nurse may titrate settings and frequency of dressing changes as clinically indicated. - Collagen with silver under sponge, window pane wound with duoderm before applying NPWT dressing Home Health Nurse may d/c VAC for s/s of increased infection, significant wound regression, or uncontrolled drainage. Catar[place] at [phone]. Apply contact layer over base of wound. Number of foam/gauze pieces used in the dressing = General Notes: apply WTD dressing today. HH to apply wound vac on todays visit. 1. I would recommend at this point that we going continue with the wound VAC I am going recommend that we use DuoDERM around the edges of the wound in order to help prevent any skin breakdown from the excess moisture that is the one issue that I saw today. 2. I am also can recommend at this time that the patient continue to be monitored for any signs of worsening infection overall. Again right now I do not see any evidence of infection but again we can keep a close eye on this. 3. He should continue to offload there is to be no weightbearing on the foot wheelchair only as he cannot use his knee scooter. We will see patient back for reevaluation in 2 weeks here in the clinic. If anything worsens or changes patient will contact our office for additional recommendations. ZHAIRE, LOCKER ([id number]) Electronic Signature(s) Signed: 12/10/2019 4:39:51 PM By: Worthy Keeler [name] Entered By: Worthy Keeler on 12/10/2019 16:39:51 [name], [name]  ([id number]) -------------------------------------------------------------------------------- SuperBill Details Patient Name: [name] Date of Service: 12/10/2019 Medical Record Number: [id number] Patient Account Number: [id number] Date of Birth/Sex: 1945/10/29 (74 y.o. M) Treating RN: Cornell Barman Primary Care Provider: [name] Other Clinician: Referring Provider: [name] Treating Provider/Extender: [name], Countess Biebel Weeks in Treatment: 1 Diagnosis Coding ICD-10 Codes Code  Description E11.621 Type 2 diabetes mellitus with foot ulcer L97.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scle E11.42 Type 2 diabetes mellitus with diabetic polyneuropathy M21.372 Foot drop, left foot Facility Procedures CPT4 Code: 92763943 Description: 99213 - WOUND CARE VISIT-LEV 3 EST PT Modifier: Quantity: 1 Physician Procedures CPT4 Code: 2003794 Description: 44619 - WC PHYS LEVEL 4 - EST PT Modifier: Quantity: 1 CPT4 Code: Description: ICD-10 Diagnosis Description E11.621 Type 2 diabetes mellitus with foot ulcer L97.523 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

## 2019-12-11 ENCOUNTER — Inpatient Hospital Stay: Payer: Medicare Other

## 2019-12-11 ENCOUNTER — Other Ambulatory Visit: Payer: Self-pay | Admitting: Physician Assistant

## 2019-12-11 ENCOUNTER — Other Ambulatory Visit: Payer: Self-pay

## 2019-12-11 ENCOUNTER — Inpatient Hospital Stay (HOSPITAL_BASED_OUTPATIENT_CLINIC_OR_DEPARTMENT_OTHER): Payer: Medicare Other | Admitting: Internal Medicine

## 2019-12-11 ENCOUNTER — Encounter: Payer: Self-pay | Admitting: Internal Medicine

## 2019-12-11 VITALS — HR 105

## 2019-12-11 VITALS — BP 164/89 | HR 108 | Temp 97.8°F | Resp 18 | Ht 71.0 in | Wt 187.3 lb

## 2019-12-11 DIAGNOSIS — C3491 Malignant neoplasm of unspecified part of right bronchus or lung: Secondary | ICD-10-CM

## 2019-12-11 DIAGNOSIS — E1122 Type 2 diabetes mellitus with diabetic chronic kidney disease: Secondary | ICD-10-CM | POA: Diagnosis not present

## 2019-12-11 DIAGNOSIS — Z5111 Encounter for antineoplastic chemotherapy: Secondary | ICD-10-CM

## 2019-12-11 DIAGNOSIS — C3490 Malignant neoplasm of unspecified part of unspecified bronchus or lung: Secondary | ICD-10-CM

## 2019-12-11 DIAGNOSIS — I10 Essential (primary) hypertension: Secondary | ICD-10-CM | POA: Diagnosis not present

## 2019-12-11 DIAGNOSIS — IMO0002 Reserved for concepts with insufficient information to code with codable children: Secondary | ICD-10-CM

## 2019-12-11 DIAGNOSIS — N183 Chronic kidney disease, stage 3 unspecified: Secondary | ICD-10-CM | POA: Diagnosis not present

## 2019-12-11 DIAGNOSIS — C3431 Malignant neoplasm of lower lobe, right bronchus or lung: Secondary | ICD-10-CM | POA: Diagnosis not present

## 2019-12-11 LAB — CBC WITH DIFFERENTIAL (CANCER CENTER ONLY)
Abs Immature Granulocytes: 0.05 10*3/uL (ref 0.00–0.07)
Basophils Absolute: 0.1 10*3/uL (ref 0.0–0.1)
Basophils Relative: 1 %
Eosinophils Absolute: 0 10*3/uL (ref 0.0–0.5)
Eosinophils Relative: 0 %
HCT: 27.2 % — ABNORMAL LOW (ref 39.0–52.0)
Hemoglobin: 9.2 g/dL — ABNORMAL LOW (ref 13.0–17.0)
Immature Granulocytes: 1 %
Lymphocytes Relative: 8 %
Lymphs Abs: 0.5 10*3/uL — ABNORMAL LOW (ref 0.7–4.0)
MCH: 27.9 pg (ref 26.0–34.0)
MCHC: 33.8 g/dL (ref 30.0–36.0)
MCV: 82.4 fL (ref 80.0–100.0)
Monocytes Absolute: 0.9 10*3/uL (ref 0.1–1.0)
Monocytes Relative: 14 %
Neutro Abs: 4.6 10*3/uL (ref 1.7–7.7)
Neutrophils Relative %: 76 %
Platelet Count: 234 10*3/uL (ref 150–400)
RBC: 3.3 MIL/uL — ABNORMAL LOW (ref 4.22–5.81)
RDW: 15.6 % — ABNORMAL HIGH (ref 11.5–15.5)
WBC Count: 6.1 10*3/uL (ref 4.0–10.5)
nRBC: 0 % (ref 0.0–0.2)

## 2019-12-11 LAB — CMP (CANCER CENTER ONLY)
ALT: 7 U/L (ref 0–44)
AST: 9 U/L — ABNORMAL LOW (ref 15–41)
Albumin: 2.8 g/dL — ABNORMAL LOW (ref 3.5–5.0)
Alkaline Phosphatase: 103 U/L (ref 38–126)
Anion gap: 10 (ref 5–15)
BUN: 15 mg/dL (ref 8–23)
CO2: 28 mmol/L (ref 22–32)
Calcium: 9.3 mg/dL (ref 8.9–10.3)
Chloride: 95 mmol/L — ABNORMAL LOW (ref 98–111)
Creatinine: 1.33 mg/dL — ABNORMAL HIGH (ref 0.61–1.24)
GFR, Est AFR Am: >60 mL/min (ref >60)
GFR, Estimated: 52 mL/min — ABNORMAL LOW (ref >60)
Glucose, Bld: 329 mg/dL — ABNORMAL HIGH (ref 70–99)
Potassium: 3.7 mmol/L (ref 3.5–5.1)
Sodium: 133 mmol/L — ABNORMAL LOW (ref 135–145)
Total Bilirubin: 0.4 mg/dL (ref 0.3–1.2)
Total Protein: 6.6 g/dL (ref 6.5–8.1)

## 2019-12-11 MED ORDER — PALONOSETRON HCL INJECTION 0.25 MG/5ML
0.2500 mg | Freq: Once | INTRAVENOUS | Status: AC
  Administered 2019-12-11: 0.25 mg via INTRAVENOUS

## 2019-12-11 MED ORDER — PALONOSETRON HCL INJECTION 0.25 MG/5ML
INTRAVENOUS | Status: AC
  Filled 2019-12-11: qty 5

## 2019-12-11 MED ORDER — SODIUM CHLORIDE 0.9% FLUSH
10.0000 mL | INTRAVENOUS | Status: DC | PRN
  Administered 2019-12-11: 10 mL
  Filled 2019-12-11: qty 10

## 2019-12-11 MED ORDER — SODIUM CHLORIDE 0.9 % IV SOLN
Freq: Once | INTRAVENOUS | Status: AC
  Filled 2019-12-11: qty 250

## 2019-12-11 MED ORDER — SODIUM CHLORIDE 0.9 % IV SOLN
97.0000 mg/m2 | Freq: Once | INTRAVENOUS | Status: AC
  Administered 2019-12-11: 200 mg via INTRAVENOUS
  Filled 2019-12-11: qty 10

## 2019-12-11 MED ORDER — SODIUM CHLORIDE 0.9 % IV SOLN
150.0000 mg | Freq: Once | INTRAVENOUS | Status: AC
  Administered 2019-12-11: 150 mg via INTRAVENOUS
  Filled 2019-12-11: qty 150

## 2019-12-11 MED ORDER — HEPARIN SOD (PORK) LOCK FLUSH 100 UNIT/ML IV SOLN
500.0000 [IU] | Freq: Once | INTRAVENOUS | Status: AC | PRN
  Administered 2019-12-11: 500 [IU]
  Filled 2019-12-11: qty 5

## 2019-12-11 MED ORDER — SODIUM CHLORIDE 0.9 % IV SOLN
10.0000 mg | Freq: Once | INTRAVENOUS | Status: AC
  Administered 2019-12-11: 10 mg via INTRAVENOUS
  Filled 2019-12-11: qty 10

## 2019-12-11 MED ORDER — SODIUM CHLORIDE 0.9 % IV SOLN
370.0000 mg | Freq: Once | INTRAVENOUS | Status: AC
  Administered 2019-12-11: 370 mg via INTRAVENOUS
  Filled 2019-12-11: qty 37

## 2019-12-11 NOTE — Patient Instructions (Signed)
[facility] Discharge Instructions for Patients Receiving Chemotherapy  Today you received the following chemotherapy agents: Carboplatin and Etoposide  To help prevent nausea and vomiting after your treatment, we encourage you to take your nausea medication As directed by your MD.   If you develop nausea and vomiting that is not controlled by your nausea medication, call the clinic.   BELOW ARE SYMPTOMS THAT SHOULD BE REPORTED IMMEDIATELY:  *FEVER GREATER THAN 100.5 F  *CHILLS WITH OR WITHOUT FEVER  NAUSEA AND VOMITING THAT IS NOT CONTROLLED WITH YOUR NAUSEA MEDICATION  *UNUSUAL SHORTNESS OF BREATH  *UNUSUAL BRUISING OR BLEEDING  TENDERNESS IN MOUTH AND THROAT WITH OR WITHOUT PRESENCE OF ULCERS  *URINARY PROBLEMS  *BOWEL PROBLEMS  UNUSUAL RASH Items with * indicate a potential emergency and should be followed up as soon as possible.  Feel free to call the clinic should you have any questions or concerns. The clinic phone number is [phone].  Please show the [facility] at check-in to the Emergency Department and triage nurse.

## 2019-12-11 NOTE — Progress Notes (Signed)
Per Dr. [name], [facility] for treatment today with elevated HR. HR=105 pt. denies chest pain, dizziness, and no SOB noted.

## 2019-12-11 NOTE — Progress Notes (Signed)
Spoke with MD [name] will continue current Carboplatin dose of 370 mg.  Larene Beach, PharmD

## 2019-12-11 NOTE — Progress Notes (Signed)
[name]   [phone]  OFFICE PROGRESS NOTE  Dustin Loffler, MD [place] [address]  DIAGNOSIS: Limited stage small cell lung cancer.He presented with a large right lower lobe/right hilar mass with obstruction of the right lower lobe bronchus. The patient also has associated bulky right lower paratracheal and subcarinal adenopathy.He was diagnosed in July 2021.  PRIOR THERAPY: 1)weekly concurrent chemoradiation with carboplatin for an AUC of 2 and paclitaxel 45 mg/m.First dose 10/29/2019. Status post 1 cycle. Discontinued due to change in diagnosis.  CURRENT THERAPY: Systemic chemotherapy with carboplatin for an AUC of 5 on day 1 and etoposide 100 mg per metered squared on days 1, 2, and 3 IV every 3 weeks with Neulasta support. First dose expected on 11/12/2019. Status post 1 cycle. Undergoing concurrent radiation under the care of Dr. Lisbeth Renshaw for the obstructive lung mass.   INTERVAL HISTORY: [name] 74 y.o. male returns to the clinic today for follow-up visit.  The patient is feeling fine today with no concerning complaints.  His fourth infection has significantly improved.  He completed a course of antibiotics for the cellulitis of the left foot.  He is currently on amoxicillin for some skin infection by his dermatologist.  He denied having any current chest pain, shortness of breath, cough or hemoptysis.  He has no nausea, vomiting, diarrhea or constipation.  He denied having any headache or visual changes.  His treatment was delayed by 2 weeks because of the recent cellulitis.  The patient is here today for evaluation before proceeding with the second cycle of his treatment.  MEDICAL HISTORY: Past Medical History:  Diagnosis Date  . Allergic rhinitis due to pollen   . Cancer ([place])   . Chronic kidney disease, stage III (moderate) 12/19/2012  . Depression   . Diabetes mellitus ([place])   .  Diverticulosis   . Erectile dysfunction associated with type 2 diabetes mellitus ([place]) 05/28/2015  . Former very heavy cigarette smoker (more than 40 per day) 10/07/2014  . GERD (gastroesophageal reflux disease)   . Gout   . Hyperlipidemia   . Hypertension   . Hypogonadism male 06/14/2012  . Iron deficiency   . Memory loss   . Osteoarthritis   . Personal history of colonic polyps    1996    ALLERGIES:  is allergic to tetracycline.  MEDICATIONS:  Current Outpatient Medications  Medication Sig Dispense Refill  . acetaminophen (QC ACETAMINOPHEN 8HR ARTH PAIN) 650 MG CR tablet Take 650 mg by mouth every 8 (eight) hours as needed for pain.    [name] Kitchen amoxicillin (AMOXIL) 500 MG capsule Take 1 capsule (500 mg total) by mouth every 8 (eight) hours. 6 capsule 0  . ampicillin (PRINCIPEN) 500 MG capsule Take 500 mg by mouth daily as needed (flare up).     . Ascorbic Acid (VITAMIN C) 1000 MG tablet Take 1,000 mg by mouth daily.    . cholecalciferol (VITAMIN D) 1000 UNITS tablet Take 1,000 Units by mouth daily.      . ciprofloxacin (CIPRO) 500 MG tablet Take 1 tablet (500 mg total) by mouth 2 (two) times daily. 4 tablet 0  . donepezil (ARICEPT) 10 MG tablet TAKE 1 TABLET BY MOUTH AT BEDTIME (Patient taking differently: Take 10 mg by mouth at bedtime. ) 90 tablet 3  . fluticasone (FLONASE) 50 MCG/ACT nasal spray PLACE 2 SPRAYS IN EACH NOSTRIL DAILY (Patient taking differently: Place 2 sprays into  both nostrils daily as needed for allergies or rhinitis. ) 48 mL 3  . guaiFENesin-dextromethorphan (ROBITUSSIN DM) 100-10 MG/5ML syrup Take 5 mLs by mouth every 6 (six) hours as needed for cough. 118 mL 0  . Hyprom-Naphaz-Polysorb-Zn Sulf (CLEAR EYES COMPLETE OP) Place 1 drop into both eyes daily as needed (dry eyes).    . Insulin Pen Needle (PEN NEEDLES) 31G X 5 MM MISC 1 each by Does not apply route 3 (three) times daily. To inject insulin E11.42 90 each 2  . insulin regular human CONCENTRATED (HUMULIN R U-500  KWIKPEN) 500 UNIT/ML kwikpen Inject under skin 80-90 units before b'fast, 60 units before lunch, 80-90 units vefore dinner (Patient taking differently: Inject 20-40 Units into the skin See admin instructions. Takes 35-40 units before breakfast, 20 units at lunch and 35-40 units at night) 6 pen 5  . lidocaine-prilocaine (EMLA) cream Apply 1 application topically as needed. (Patient taking differently: Apply 1 application topically as needed (port access). ) 30 g 2  . memantine (NAMENDA) 10 MG tablet TAKE 1 TABLET BY MOUTH TWICE A DAY (Patient taking differently: Take 10 mg by mouth 2 (two) times daily. ) 180 tablet 3  . metoprolol succinate (TOPROL-XL) 25 MG 24 hr tablet Take 0.5 tablets (12.5 mg total) by mouth daily. 30 tablet 3  . montelukast (SINGULAIR) 10 MG tablet Take 1 tablet (10 mg total) by mouth at bedtime. 30 tablet 3  . omeprazole (PRILOSEC) 40 MG capsule Take 1 capsule (40 mg total) by mouth 2 (two) times daily before a meal. (Patient taking differently: Take 40 mg by mouth daily. ) 180 capsule 1  . potassium chloride SA (KLOR-CON) 20 MEQ tablet Take 2 tablets (40 mEq total) by mouth 2 (two) times daily. 8 tablet 0  . prochlorperazine (COMPAZINE) 10 MG tablet Take 1 tablet (10 mg total) by mouth every 6 (six) hours as needed. (Patient taking differently: Take 10 mg by mouth every 6 (six) hours as needed for nausea. ) 30 tablet 2  . simvastatin (ZOCOR) 40 MG tablet TAKE 1 TABLET BY MOUTH AT BEDTIME (Patient taking differently: Take 40 mg by mouth every evening. ) 90 tablet 2  . sucralfate (CARAFATE) 1 g tablet Take 1 tablet (1 g total) by mouth 4 (four) times daily. Dissolve each tablet in 15 cc water before use. 120 tablet 2  . venlafaxine XR (EFFEXOR-XR) 75 MG 24 hr capsule TAKE 1 CAPSULE BY MOUTH EVERY MORNING AND 2 CAPSULES AT BEDTIME 270 capsule 1   No current facility-administered medications for this visit.    SURGICAL HISTORY:  Past Surgical History:  Procedure Laterality Date    . BRONCHIAL NEEDLE ASPIRATION BIOPSY  10/17/2019   Procedure: BRONCHIAL NEEDLE ASPIRATION BIOPSIES;  Surgeon: Juanito Doom, MD;  Location: [facility];  Service: Cardiopulmonary;;  . CARDIAC CATHETERIZATION  11/2011   [facility]  . CHOLECYSTECTOMY  1980  . ENDOBRONCHIAL ULTRASOUND Bilateral 10/17/2019   Procedure: ENDOBRONCHIAL ULTRASOUND;  Surgeon: Juanito Doom, MD;  Location: [facility];  Service: Cardiopulmonary;  Laterality: Bilateral;  . ESOPHAGOGASTRODUODENOSCOPY (EGD) WITH PROPOFOL N/A 09/27/2017   Procedure: ESOPHAGOGASTRODUODENOSCOPY (EGD) WITH PROPOFOL;  Surgeon: Lin Landsman, MD;  Location: [facility];  Service: Gastroenterology;  Laterality: N/A;  . EYE SURGERY Right 07/29/2016   [facility] - Dr. Manuella Ghazi  . EYE SURGERY Left 08/23/2106   [facility] - Dr. Manuella Ghazi  . HEMOSTASIS CONTROL  10/17/2019   Procedure: HEMOSTASIS CONTROL;  Surgeon: Juanito Doom, MD;  Location: [facility];  Service:  Cardiopulmonary;;  . IR IMAGING GUIDED PORT INSERTION  11/30/2019  . IRRIGATION AND DEBRIDEMENT ABSCESS Left 11/23/2019   Procedure: IRRIGATION AND DEBRIDEMENT ABSCESS;  Surgeon: Melina Schools, MD;  Location: [facility];  Service: Orthopedics;  Laterality: Left;  . TOTAL HIP ARTHROPLASTY  2009   Dr. Gladstone Lighter  . TOTAL HIP ARTHROPLASTY      REVIEW OF SYSTEMS:  A comprehensive review of systems was negative except for: Constitutional: positive for fatigue   PHYSICAL EXAMINATION: General appearance: alert, cooperative, fatigued and no distress Head: Normocephalic, without obvious abnormality, atraumatic Neck: no adenopathy, no JVD, supple, symmetrical, trachea midline and thyroid not enlarged, symmetric, no tenderness/mass/nodules Lymph nodes: Cervical, supraclavicular, and axillary nodes normal. Resp: clear to auscultation bilaterally Back: symmetric, no curvature. ROM normal. No CVA tenderness. Cardio: regular rate and rhythm, S1, S2 normal, no murmur, click, rub or gallop GI: soft,  non-tender; bowel sounds normal; no masses,  no organomegaly Extremities: extremities normal, atraumatic, no cyanosis or edema  ECOG PERFORMANCE STATUS: 1 - Symptomatic but completely ambulatory  Blood pressure (!) 164/89, pulse (!) 108, temperature 97.8 F (36.6 C), temperature source Tympanic, resp. rate 18, height 5\' 11"  (1.803 m), weight 187 lb 4.8 oz (85 kg), SpO2 99 %.  LABORATORY DATA: Lab Results  Component Value Date   WBC 6.1 12/11/2019   HGB 9.2 (L) 12/11/2019   HCT 27.2 (L) 12/11/2019   MCV 82.4 12/11/2019   PLT 234 12/11/2019      Chemistry      Component Value Date/Time   NA 131 (L) 12/03/2019 1330   NA 130 (L) 11/25/2011 1119   NA 137 11/16/2011 0513   K 4.9 12/03/2019 1330   K 4.1 11/16/2011 0513   CL 96 (L) 12/03/2019 1330   CL 100 11/16/2011 0513   CO2 26 12/03/2019 1330   CO2 29 11/16/2011 0513   BUN 21 12/03/2019 1330   BUN 25 11/25/2011 1119   BUN 15 11/16/2011 0513   CREATININE 1.81 (H) 12/03/2019 1330   CREATININE 1.26 11/16/2011 0513      Component Value Date/Time   CALCIUM 9.3 12/03/2019 1330   CALCIUM 9.1 11/16/2011 0513   ALKPHOS 98 12/03/2019 1330   AST 8 (L) 12/03/2019 1330   ALT 7 12/03/2019 1330   BILITOT 0.3 12/03/2019 1330       RADIOGRAPHIC STUDIES: DG Chest 2 View  Result Date: 11/22/2019 CLINICAL DATA:  Suspected sepsis EXAM: CHEST - 2 VIEW COMPARISON:  10/12/2019, CT 11/15/2019 FINDINGS: No focal consolidation or pleural effusion. Normal heart size. Decreased right hilar mass. No pneumothorax. IMPRESSION: No acute airspace disease. Interval decrease in size of right hilar mass Electronically Signed   By: Donavan Foil M.D.   On: 11/22/2019 21:07   CT Angio Chest PE W/Cm &/Or Wo Cm  Result Date: 11/15/2019 CLINICAL DATA:  Syncope. EXAM: CT ANGIOGRAPHY CHEST WITH CONTRAST TECHNIQUE: Multidetector CT imaging of the chest was performed using the standard protocol during bolus administration of intravenous contrast. Multiplanar CT  image reconstructions and MIPs were obtained to evaluate the vascular anatomy. CONTRAST:  57mL OMNIPAQUE IOHEXOL 350 MG/ML SOLN COMPARISON:  CT chest dated October 11, 2019 FINDINGS: Cardiovascular: Contrast injection is sufficient to demonstrate satisfactory opacification of the pulmonary arteries to the segmental level. There is no pulmonary embolus or evidence of right heart strain. The size of the main pulmonary artery is normal. Heart size is normal, with no pericardial effusion. There are coronary artery calcifications. There are minimal atherosclerotic changes of the  thoracic aorta without evidence for an aneurysm. There is no significant pericardial effusion. Mediastinum/Nodes: --again noted are pathologically enlarged mediastinal lymph nodes. The dominant lymph node measures approximately 2.4 x 4.3 cm (previously measuring 2.7 by 6 cm). --there are pathologically enlarged right hilar lymph nodes. -- No axillary lymphadenopathy. -- No supraclavicular lymphadenopathy. --there is a right-sided hypoattenuating thyroid nodule measuring approximately 1.5 cm (axial series 4, image 9). -  Unremarkable esophagus. Lungs/Pleura: Again noted is a right hilar mass. This mass has demonstrated significant interval decrease in size and currently measures approximately 4.7 x 4.8 cm (previously measuring approximately 6.1 x 5.8 cm). There is new cavitation within the central aspect of this mass. There is no pneumothorax. No large pleural effusion. Upper Abdomen: Contrast bolus timing is not optimized for evaluation of the abdominal organs. The patient is status post prior cholecystectomy. There is a probable nonobstructing stone in the upper pole the left kidney. There is variant hepatic arterial anatomy with a replaced common hepatic artery. Musculoskeletal: There are healing fractures of the anterior there through fifth ribs on the patient's left. There is no new acute displaced fracture. Review of the MIP images confirms the  above findings. IMPRESSION: 1. No evidence for acute pulmonary embolism. 2. Findings consistent with a positive response to treatment as evidence by interval decrease in size of the right hilar mass and mediastinal lymph nodes. 3. Right-sided hypoattenuating thyroid nodule measuring approximately 1.5 cm. Outpatient follow-up ultrasound is recommended if it has not already been performed.(Ref: J Am Coll Radiol. 2015 Feb;12(2): 143-50). 4. Healing left-sided rib fractures. Aortic Atherosclerosis (ICD10-I70.0). Electronically Signed   By: Constance Holster M.D.   On: 11/15/2019 18:37   MR FOOT LEFT W WO CONTRAST  Result Date: 11/23/2019 CLINICAL DATA:  Chronic left foot ulcer with erythema and recent drainage. History of lung cancer. Concern for osteomyelitis. EXAM: MRI OF THE LEFT FOREFOOT WITHOUT AND WITH CONTRAST TECHNIQUE: Multiplanar, multisequence MR imaging of the left forefoot was performed both before and after administration of intravenous contrast. CONTRAST:  31mL GADAVIST GADOBUTROL 1 MMOL/ML IV SOLN COMPARISON:  Radiographs 11/22/2019 and 02/12/2019 FINDINGS: Bones/Joint/Cartilage There is soft tissue ulceration along the plantar aspect of the midfoot at the level of the 4th and 5th metatarsal bases. There appears to be mild soft tissue ulceration along the plantar aspect of the 5th metatarsal head as well. No underlying cortical destruction, marrow edema or suspicious marrow enhancement. There are mild midfoot and 1st MTP degenerative changes without significant joint effusions. The tibial sesamoid of the 1st metatarsal is bipartite. The distal toes are incompletely visualized. Possible nondisplaced transverse fracture through the base of the 5th proximal phalanx. Ligaments Intact Lisfranc ligament. 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 No focal intramuscular fluid collection or tenosynovitis. The ankle tendons appear intact. Soft  tissues As above, apparent soft tissue ulceration along the plantar aspect of the 4th and 5th metatarsal bases and the 5th metatarsal head. There is associated heterogeneous enhancement following contrast, but no evidence of drainable fluid collection or foreign body. IMPRESSION: 1. Soft tissue ulceration along the plantar aspect of the midfoot at the level of the 4th and 5th metatarsal bases and the 5th metatarsal head. No evidence of drainable fluid collection or foreign body. 2. No evidence of osteomyelitis. 3. Possible nondisplaced transverse fracture through the base of the 5th proximal phalanx. Electronically Signed   By: Richardean Sale M.D.   On: 11/23/2019 16:31   DG CHEST  PORT 1 VIEW  Result Date: 11/26/2019 CLINICAL DATA:  Foot ulcer.  Sepsis. EXAM: PORTABLE CHEST 1 VIEW COMPARISON:  11/22/2019 FINDINGS: The cardiac silhouette, mediastinal and hilar contours are within normal limits and stable. Stable radiation changes are noted in the right hilum/paramediastinal lung. No acute pulmonary findings or pleural effusions. No worrisome pulmonary lesions. The bony thorax is intact. IMPRESSION: 1. Stable radiation changes in the right hilum/paramediastinal lung. 2. No acute cardiopulmonary findings. Electronically Signed   By: Marijo Sanes M.D.   On: 11/26/2019 15:45   DG Foot Complete Left  Result Date: 11/22/2019 CLINICAL DATA:  Foot pain EXAM: LEFT FOOT - COMPLETE 3+ VIEW COMPARISON:  02/12/2019 FINDINGS: No acute displaced fracture or malalignment. Soft tissue ulceration at the mid sole of the foot. No radiopaque foreign body. Small plantar calcaneal spur. No osseous destructive change. Mild degenerative change at the first MTP joint. IMPRESSION: No acute osseous abnormality. Soft tissue ulceration at the mid sole of the foot. Electronically Signed   By: Donavan Foil M.D.   On: 11/22/2019 21:38   VAS [place] ABI WITH/WO TBI  Result Date: 11/24/2019 LOWER EXTREMITY DOPPLER STUDY Indications:  Ulceration of LT foot. High Risk Factors: Hypertension, Diabetes.  Comparison Study: No prior studies. Performing Technologist: Darlin Coco  Examination Guidelines: A complete evaluation includes at minimum, Doppler waveform signals and systolic blood pressure reading at the level of bilateral brachial, anterior tibial, and posterior tibial arteries, when vessel segments are accessible. Bilateral testing is considered an integral part of a complete examination. Photoelectric Plethysmograph (PPG) waveforms and toe systolic pressure readings are included as required and additional duplex testing as needed. Limited examinations for reoccurring indications may be performed as noted.  ABI Findings: +---------+------------------+-----+---------+--------+ Right    Rt Pressure (mmHg)IndexWaveform Comment  +---------+------------------+-----+---------+--------+ Brachial 154                    triphasic         +---------+------------------+-----+---------+--------+ PTA      161               1.05 triphasic         +---------+------------------+-----+---------+--------+ DP       143               0.93 triphasic         +---------+------------------+-----+---------+--------+ Great Toe100               0.65 Abnormal          +---------+------------------+-----+---------+--------+ +---------+------------------+-----+---------+-------+ Left     Lt Pressure (mmHg)IndexWaveform Comment +---------+------------------+-----+---------+-------+ Brachial 152                    triphasic        +---------+------------------+-----+---------+-------+ PTA      155               1.01 triphasic        +---------+------------------+-----+---------+-------+ DP       114               0.74 triphasic        +---------+------------------+-----+---------+-------+ Great Toe120               0.78 Normal           +---------+------------------+-----+---------+-------+  +-------+-----------+-----------+------------+------------+ ABI/TBIToday's ABIToday's TBIPrevious ABIPrevious TBI +-------+-----------+-----------+------------+------------+ Right  1.05       0.65                                +-------+-----------+-----------+------------+------------+  Left   1.01       0.78                                +-------+-----------+-----------+------------+------------+  Summary: Right: Resting right ankle-brachial index is within normal range. No evidence of significant right lower extremity arterial disease. The right toe-brachial index is mildly abnormal. Left: Resting left ankle-brachial index is within normal range. No evidence of significant left lower extremity arterial disease. The left toe-brachial index is normal.  *See table(s) above for measurements and observations.  Electronically signed by Ruta Hinds MD on 11/24/2019 at 12:11:40 PM.    Final    IR IMAGING GUIDED PORT INSERTION  Result Date: 11/30/2019 INDICATION: 74 year old with lung cancer. Port-A-Cath needed for systemic therapy. EXAM: FLUOROSCOPIC AND ULTRASOUND GUIDED PLACEMENT OF A SUBCUTANEOUS PORT COMPARISON:  None. MEDICATIONS: Ancef 2 g; The antibiotic was administered within an appropriate time interval prior to skin puncture. ANESTHESIA/SEDATION: Versed 2.0 mg IV; Fentanyl 100 mcg IV; Moderate Sedation Time:  30 minutes The patient was continuously monitored during the procedure by the interventional radiology nurse under my direct supervision. FLUOROSCOPY TIME:  18 seconds, 3 mGy COMPLICATIONS: None immediate. PROCEDURE: The procedure, risks, benefits, and alternatives were explained to the patient. Questions regarding the procedure were encouraged and answered. The patient understands and consents to the procedure. Patient was placed supine on the interventional table. Ultrasound confirmed a patent right internal jugular vein. Ultrasound image was saved for documentation. The right  chest and neck were cleaned with a skin antiseptic and a sterile drape was placed. Maximal barrier sterile technique was utilized including caps, mask, sterile gowns, sterile gloves, sterile drape, hand hygiene and skin antiseptic. The right neck was anesthetized with 1% lidocaine. Small incision was made in the right neck with a blade. Micropuncture set was placed in the right internal jugular vein with ultrasound guidance. The micropuncture wire was used for measurement purposes. The right chest was anesthetized with 1% lidocaine with epinephrine. #15 blade was used to make an incision and a subcutaneous port pocket was formed. [place] was assembled. Subcutaneous tunnel was formed with a stiff tunneling device. The port catheter was brought through the subcutaneous tunnel. The port was placed in the subcutaneous pocket. The micropuncture set was exchanged for a peel-away sheath. The catheter was placed through the peel-away sheath and the tip was positioned at the superior cavoatrial junction. Catheter placement was confirmed with fluoroscopy. The port was accessed and flushed with heparinized saline. The port pocket was closed using two layers of absorbable sutures and Dermabond. The vein skin site was closed using a single layer of absorbable suture and Dermabond. Sterile dressings were applied. Patient tolerated the procedure well without an immediate complication. Ultrasound and fluoroscopic images were taken and saved for this procedure. IMPRESSION: Placement of a subcutaneous port device. Catheter tip at the superior cavoatrial junction. Electronically Signed   By: Markus Daft M.D.   On: 11/30/2019 10:53    ASSESSMENT AND PLAN: This is a very pleasant 74 years old white male recently diagnosed with limited stage small cell lung cancer presented with large right lower lobe/right hilar mass with obstruction of the right lower lobe bronchus and bulky right lower lobe paratracheal and subcarinal  adenopathy diagnosed in July 2021. The patient is currently undergoing systemic chemotherapy with carboplatin and etoposide status post 1 cycle. The start of cycle #2 was delayed by 2 weeks because  of cellulitis of the left foot.  He has significant improvement in the cellulitis and infection and he is currently off of the antibiotics. I recommended for the patient to proceed with cycle #2 today as planned. He will come back for follow-up visit and 3 weeks for evaluation with repeat CT scan of the chest for restaging of his disease. For the tachycardia, the patient mentions that he is anxious about the treatment today.  We will continue to monitor closely. He was advised to call immediately if he has any concerning symptoms in the interval. The patient voices understanding of current disease status and treatment options and is in agreement with the current care plan.  All questions were answered. The patient knows to call the clinic with any problems, questions or concerns. We can certainly see the patient much sooner if necessary.  Disclaimer: This note was dictated with voice recognition software. Similar sounding words can inadvertently be transcribed and may not be corrected upon review.

## 2019-12-12 ENCOUNTER — Other Ambulatory Visit: Payer: Self-pay

## 2019-12-12 ENCOUNTER — Inpatient Hospital Stay: Payer: Medicare Other | Attending: Physician Assistant

## 2019-12-12 VITALS — BP 161/89 | HR 89 | Temp 98.4°F | Resp 18

## 2019-12-12 DIAGNOSIS — E1165 Type 2 diabetes mellitus with hyperglycemia: Secondary | ICD-10-CM | POA: Diagnosis not present

## 2019-12-12 DIAGNOSIS — E1142 Type 2 diabetes mellitus with diabetic polyneuropathy: Secondary | ICD-10-CM | POA: Diagnosis not present

## 2019-12-12 DIAGNOSIS — N1831 Chronic kidney disease, stage 3a: Secondary | ICD-10-CM | POA: Diagnosis not present

## 2019-12-12 DIAGNOSIS — C3491 Malignant neoplasm of unspecified part of right bronchus or lung: Secondary | ICD-10-CM | POA: Diagnosis not present

## 2019-12-12 DIAGNOSIS — C3431 Malignant neoplasm of lower lobe, right bronchus or lung: Secondary | ICD-10-CM | POA: Insufficient documentation

## 2019-12-12 DIAGNOSIS — W010XXD Fall on same level from slipping, tripping and stumbling without subsequent striking against object, subsequent encounter: Secondary | ICD-10-CM | POA: Diagnosis not present

## 2019-12-12 DIAGNOSIS — Z794 Long term (current) use of insulin: Secondary | ICD-10-CM | POA: Diagnosis not present

## 2019-12-12 DIAGNOSIS — Z9181 History of falling: Secondary | ICD-10-CM | POA: Diagnosis not present

## 2019-12-12 DIAGNOSIS — I129 Hypertensive chronic kidney disease with stage 1 through stage 4 chronic kidney disease, or unspecified chronic kidney disease: Secondary | ICD-10-CM | POA: Diagnosis not present

## 2019-12-12 DIAGNOSIS — L97429 Non-pressure chronic ulcer of left heel and midfoot with unspecified severity: Secondary | ICD-10-CM | POA: Diagnosis not present

## 2019-12-12 DIAGNOSIS — E11621 Type 2 diabetes mellitus with foot ulcer: Secondary | ICD-10-CM | POA: Diagnosis not present

## 2019-12-12 DIAGNOSIS — Z5111 Encounter for antineoplastic chemotherapy: Secondary | ICD-10-CM | POA: Insufficient documentation

## 2019-12-12 DIAGNOSIS — R000 Tachycardia, unspecified: Secondary | ICD-10-CM | POA: Diagnosis not present

## 2019-12-12 DIAGNOSIS — E041 Nontoxic single thyroid nodule: Secondary | ICD-10-CM | POA: Diagnosis not present

## 2019-12-12 DIAGNOSIS — J45909 Unspecified asthma, uncomplicated: Secondary | ICD-10-CM | POA: Diagnosis not present

## 2019-12-12 DIAGNOSIS — G4733 Obstructive sleep apnea (adult) (pediatric): Secondary | ICD-10-CM | POA: Diagnosis not present

## 2019-12-12 DIAGNOSIS — L02612 Cutaneous abscess of left foot: Secondary | ICD-10-CM | POA: Diagnosis not present

## 2019-12-12 DIAGNOSIS — Z87891 Personal history of nicotine dependence: Secondary | ICD-10-CM | POA: Diagnosis not present

## 2019-12-12 DIAGNOSIS — S2232XD Fracture of one rib, left side, subsequent encounter for fracture with routine healing: Secondary | ICD-10-CM | POA: Diagnosis not present

## 2019-12-12 DIAGNOSIS — Z5189 Encounter for other specified aftercare: Secondary | ICD-10-CM | POA: Insufficient documentation

## 2019-12-12 MED ORDER — SODIUM CHLORIDE 0.9 % IV SOLN
10.0000 mg | Freq: Once | INTRAVENOUS | Status: AC
  Administered 2019-12-12: 10 mg via INTRAVENOUS
  Filled 2019-12-12: qty 1

## 2019-12-12 MED ORDER — HEPARIN SOD (PORK) LOCK FLUSH 100 UNIT/ML IV SOLN
500.0000 [IU] | Freq: Once | INTRAVENOUS | Status: AC | PRN
  Administered 2019-12-12: 500 [IU]
  Filled 2019-12-12: qty 5

## 2019-12-12 MED ORDER — SODIUM CHLORIDE 0.9 % IV SOLN
97.0000 mg/m2 | Freq: Once | INTRAVENOUS | Status: AC
  Administered 2019-12-12: 200 mg via INTRAVENOUS
  Filled 2019-12-12: qty 10

## 2019-12-12 MED ORDER — SODIUM CHLORIDE 0.9% FLUSH
10.0000 mL | INTRAVENOUS | Status: DC | PRN
  Administered 2019-12-12: 10 mL
  Filled 2019-12-12: qty 10

## 2019-12-12 MED ORDER — SODIUM CHLORIDE 0.9 % IV SOLN
Freq: Once | INTRAVENOUS | Status: AC
  Filled 2019-12-12: qty 250

## 2019-12-12 NOTE — Patient Instructions (Signed)
[facility] Discharge Instructions for Patients Receiving Chemotherapy  Today you received the following chemotherapy agents:Etoposide  To help prevent nausea and vomiting after your treatment, we encourage you to take your nausea medication As directed by your MD.   If you develop nausea and vomiting that is not controlled by your nausea medication, call the clinic.   BELOW ARE SYMPTOMS THAT SHOULD BE REPORTED IMMEDIATELY:  *FEVER GREATER THAN 100.5 F  *CHILLS WITH OR WITHOUT FEVER  NAUSEA AND VOMITING THAT IS NOT CONTROLLED WITH YOUR NAUSEA MEDICATION  *UNUSUAL SHORTNESS OF BREATH  *UNUSUAL BRUISING OR BLEEDING  TENDERNESS IN MOUTH AND THROAT WITH OR WITHOUT PRESENCE OF ULCERS  *URINARY PROBLEMS  *BOWEL PROBLEMS  UNUSUAL RASH Items with * indicate a potential emergency and should be followed up as soon as possible.  Feel free to call the clinic should you have any questions or concerns. The clinic phone number is [phone].  Please show the [facility] at check-in to the Emergency Department and triage nurse.

## 2019-12-12 NOTE — Progress Notes (Signed)
ESAU, FRIDMAN ([id number]) Visit Report for 12/10/2019 Arrival Information Details Patient Name: [name], [name] Date of Service: 12/10/2019 10:15 AM Medical Record Number: [id number] Patient Account Number: [id number] Date of Birth/Sex: 1945-12-08 (74 y.o. M) Treating RN: Cornell Barman Primary Care Casee Knepp: [name] Other Clinician: Referring Kynzie Polgar: [name] Treating Regine Christian/Extender: Melburn Hake, HOYT Weeks in Treatment: 1 Visit Information History Since Last Visit All ordered tests and consults were completed: No Patient Arrived: Ambulatory Added or deleted any medications: No Arrival Time: 10:27 Any new allergies or adverse reactions: No Accompanied By: self Had a fall or experienced change in No Transfer Assistance: None activities of daily living that may affect Patient Identification Verified: Yes risk of falls: Secondary Verification Process Completed: Yes Signs or symptoms of abuse/neglect since last visito No Patient Requires Transmission-Based Precautions: No Hospitalized since last visit: No Implantable device outside of the clinic excluding No cellular tissue based products placed in the center since last visit: Has Dressing in Place as Prescribed: Yes Pain Present Now: No Electronic Signature(s) Signed: 12/10/2019 2:14:51 PM By: Darci Needle Entered By: Darci Needle on 12/10/2019 10:28:00 [name] ([id number]) -------------------------------------------------------------------------------- Clinic Level of Care Assessment Details Patient Name: [name] Date of Service: 12/10/2019 10:15 AM Medical Record Number: [id number] Patient Account Number: [id number] Date of Birth/Sex: [birth year] (74 y.o. M) Treating RN: Grover Canavan Primary Care Briyah Wheelwright: [name] Other Clinician: Referring Elishia Kaczorowski: [name] Treating Deryk Bozman/Extender: Melburn Hake, HOYT Weeks in Treatment: 1 Clinic Level of Care Assessment  Items TOOL 4 Quantity Score []  - Use when only an EandM is performed on FOLLOW-UP visit 0 ASSESSMENTS - Nursing Assessment / Reassessment X - Reassessment of Co-morbidities (includes updates in patient status) 1 10 X- 1 5 Reassessment of Adherence to Treatment Plan ASSESSMENTS - Wound and Skin Assessment / Reassessment []  - Simple Wound Assessment / Reassessment - one wound 0 X- 2 5 Complex Wound Assessment / Reassessment - multiple wounds []  - 0 Dermatologic / Skin Assessment (not related to wound area) ASSESSMENTS - Focused Assessment []  - Circumferential Edema Measurements - multi extremities 0 []  - 0 Nutritional Assessment / Counseling / Intervention X- 1 5 Lower Extremity Assessment (monofilament, tuning fork, pulses) []  - 0 Peripheral Arterial Disease Assessment (using hand held doppler) ASSESSMENTS - Ostomy and/or Continence Assessment and Care []  - Incontinence Assessment and Management 0 []  - 0 Ostomy Care Assessment and Management (repouching, etc.) PROCESS - Coordination of Care X - Simple Patient / Family Education for ongoing care 1 15 []  - 0 Complex (extensive) Patient / Family Education for ongoing care []  - 0 Staff obtains Programmer, systems, Records, Test Results / Process Orders []  - 0 Staff telephones HHA, Nursing Homes / Clarify orders / etc []  - 0 Routine Transfer to another Facility (non-emergent condition) []  - 0 Routine Hospital Admission (non-emergent condition) []  - 0 New Admissions / Biomedical engineer / Ordering NPWT, Apligraf, etc. []  - 0 Emergency Hospital Admission (emergent condition) X- 1 10 Simple Discharge Coordination []  - 0 Complex (extensive) Discharge Coordination PROCESS - Special Needs []  - Pediatric / Minor Patient Management 0 []  - 0 Isolation Patient Management []  - 0 Hearing / Language / Visual special needs []  - 0 Assessment of Community assistance (transportation, D/C planning, etc.) []  - 0 Additional assistance /  Altered mentation []  - 0 Support Surface(s) Assessment (bed, cushion, seat, etc.) INTERVENTIONS - Wound Cleansing / Measurement Bielby, Ahmaud E. ([id number]) []  - 0 Simple Wound Cleansing - one wound X- 2 5  Complex Wound Cleansing - multiple wounds []  - 0 Wound Imaging (photographs - any number of wounds) []  - 0 Wound Tracing (instead of photographs) []  - 0 Simple Wound Measurement - one wound []  - 0 Complex Wound Measurement - multiple wounds INTERVENTIONS - Wound Dressings []  - Small Wound Dressing one or multiple wounds 0 X- 2 15 Medium Wound Dressing one or multiple wounds []  - 0 Large Wound Dressing one or multiple wounds []  - 0 Application of Medications - topical []  - 0 Application of Medications - injection INTERVENTIONS - Miscellaneous []  - External ear exam 0 []  - 0 Specimen Collection (cultures, biopsies, blood, body fluids, etc.) []  - 0 Specimen(s) / Culture(s) sent or taken to Lab for analysis []  - 0 Patient Transfer (multiple staff / Civil Service fast streamer / Similar devices) []  - 0 Simple Staple / Suture removal (25 or less) []  - 0 Complex Staple / Suture removal (26 or more) []  - 0 Hypo / Hyperglycemic Management (close monitor of Blood Glucose) []  - 0 Ankle / Brachial Index (ABI) - do not check if billed separately X- 1 5 Vital Signs Has the patient been seen at the hospital within the last three years: Yes Total Score: 100 Level Of Care: New/Established - Level 3 Electronic Signature(s) Signed: 12/10/2019 5:20:53 PM By: Grover Canavan Entered By: Grover Canavan on 12/10/2019 11:11:37 [name] ([id number]) -------------------------------------------------------------------------------- Encounter Discharge Information Details Patient Name: [name] Date of Service: 12/10/2019 10:15 AM Medical Record Number: [id number] Patient Account Number: [id number] Date of Birth/Sex: November 17, 1945 (74 y.o. M) Treating RN: Grover Canavan Primary Care  Lonell Stamos: [name] Other Clinician: Referring Ellia Knowlton: [name] Treating Rickita Forstner/Extender: Melburn Hake, HOYT Weeks in Treatment: 1 Encounter Discharge Information Items Discharge Condition: Stable Ambulatory Status: Wheelchair Discharge Destination: Home Transportation: Private Auto Accompanied By: self Schedule Follow-up Appointment: Yes Clinical Summary of Care: Electronic Signature(s) Signed: 12/10/2019 5:20:53 PM By: Grover Canavan Entered By: Grover Canavan on 12/10/2019 11:13:32 [name] ([id number]) -------------------------------------------------------------------------------- Lower Extremity Assessment Details Patient Name: [name] Date of Service: 12/10/2019 10:15 AM Medical Record Number: [id number] Patient Account Number: [id number] Date of Birth/Sex: [birth year] (74 y.o. M) Treating RN: Cornell Barman Primary Care Nupur Hohman: [name] Other Clinician: Referring Emrey Thornley: [name] Treating Staria Birkhead/Extender: Melburn Hake, HOYT Weeks in Treatment: 1 Edema Assessment Assessed: [Left: No] [Right: No] [Left: Edema] [Right: :] Calf Left: Right: Point of Measurement: 33 cm From Medial Instep 28.5 cm cm Ankle Left: Right: Point of Measurement: 12 cm From Medial Instep 19.5 cm cm Vascular Assessment Pulses: Dorsalis Pedis Palpable: [Left:Yes] Posterior Tibial Palpable: [Left:Yes] Electronic Signature(s) Signed: 12/10/2019 2:14:51 PM By: Darci Needle Signed: 12/12/2019 11:13:21 AM By: Gretta Cool, BSN, RN, CWS, Kim RN, BSN Entered By: Darci Needle on 12/10/2019 10:41:42 [name] ([id number]) -------------------------------------------------------------------------------- Multi Wound Chart Details Patient Name: [name] Date of Service: 12/10/2019 10:15 AM Medical Record Number: [id number] Patient Account Number: [id number] Date of Birth/Sex: [birth year] (74 y.o. M) Treating RN: Grover Canavan Primary Care  Ronson Hagins: [name] Other Clinician: Referring Mabell Esguerra: [name] Treating Shoshanah Dapper/Extender: Melburn Hake, HOYT Weeks in Treatment: 1 Vital Signs Height(in): 71 Pulse(bpm): 90 Weight(lbs): 190 Blood Pressure(mmHg): 138/70 Body Mass Index(BMI): 26 Temperature(F): 97.7 Respiratory Rate(breaths/min): 18 Photos: [N/A:N/A] Wound Location: Left, Lateral, Dorsal Foot Left, Distal Toe Great N/A Wounding Event: Surgical Injury Pressure Injury N/A Primary Etiology: Diabetic Wound/Ulcer of the Lower Diabetic Wound/Ulcer of the Lower N/A Extremity Extremity Secondary Etiology: N/A Pressure Ulcer N/A Comorbid History: Cataracts, Middle ear problems,  Cataracts, Middle ear problems, N/A Sleep Apnea, Hypertension, Type II Sleep Apnea, Hypertension, Type II Diabetes, Gout, Osteoarthritis, Diabetes, Gout, Osteoarthritis, Neuropathy, Received Neuropathy, Received Chemotherapy Chemotherapy Date Acquired: 11/23/2019 09/03/2019 N/A Weeks of Treatment: 1 1 N/A Wound Status: Open Open N/A Clustered Wound: No Yes N/A Measurements L x W x D (cm) 2.4x3.6x0.5 0.5x0.7x0.1 N/A Area (cm) : 6.786 0.275 N/A Volume (cm) : 3.393 0.027 N/A % Reduction in Area: -6.70% 16.70% N/A % Reduction in Volume: -6.70% 18.20% N/A Classification: Grade 3 Grade 1 N/A Exudate Amount: Medium N/A N/A Exudate Type: Serosanguineous N/A N/A Exudate Color: red, brown N/A N/A Wound Margin: Flat and Intact N/A N/A Granulation Amount: Small (1-33%) N/A N/A Granulation Quality: Pink N/A N/A Necrotic Amount: Large (67-100%) N/A N/A Exposed Structures: Fat Layer (Subcutaneous Tissue): N/A N/A Yes Fascia: No Tendon: No Muscle: No Joint: No Bone: No Epithelialization: Small (1-33%) N/A N/A Treatment Notes Electronic Signature(s) Signed: 12/10/2019 5:20:53 PM By: Milus Height, Grier Rocher ([id number]) Entered By: Grover Canavan on 12/10/2019 11:03:19 [name]  ([id number]) -------------------------------------------------------------------------------- Multi-Disciplinary Care Plan Details Patient Name: [name] Date of Service: 12/10/2019 10:15 AM Medical Record Number: [id number] Patient Account Number: [id number] Date of Birth/Sex: 1945/04/15 (74 y.o. M) Treating RN: Grover Canavan Primary Care Tishara Pizano: [name] Other Clinician: Referring Vantasia Pinkney: [name] Treating Charnay Nazario/Extender: Melburn Hake, HOYT Weeks in Treatment: 1 Active Inactive Necrotic Tissue Nursing Diagnoses: Impaired tissue integrity related to necrotic/devitalized tissue Goals: Necrotic/devitalized tissue will be minimized in the wound bed Date Initiated: 12/03/2019 Target Resolution Date: 12/07/2019 Goal Status: Active Interventions: Assess patient pain level pre-, during and post procedure and prior to discharge Treatment Activities: Apply topical anesthetic as ordered : 12/03/2019 Notes: Orientation to the Wound Care Program Nursing Diagnoses: Knowledge deficit related to the wound healing center program Goals: Patient/caregiver will verbalize understanding of the [facility] Date Initiated: 12/03/2019 Target Resolution Date: 12/07/2019 Goal Status: Active Interventions: Provide education on orientation to the wound center Notes: Pain, Acute or Chronic Nursing Diagnoses: Pain Management - Cyclic Acute (Dressing Change Related) Goals: Patient will verbalize adequate pain control and receive pain control interventions during procedures as needed Date Initiated: 12/03/2019 Target Resolution Date: 12/07/2019 Goal Status: Active Interventions: Assess comfort goal upon admission Treatment Activities: Administer pain control measures as ordered : 12/03/2019 Notes: Wound/Skin Impairment Nursing Diagnoses: Impaired tissue integrity DONATO, STUDLEY ([id number]) Goals: Ulcer/skin breakdown will have a volume reduction of 30%  by week 4 Date Initiated: 12/03/2019 Target Resolution Date: 01/03/2020 Goal Status: Active Ulcer/skin breakdown will have a volume reduction of 50% by week 8 Date Initiated: 12/03/2019 Target Resolution Date: 02/02/2020 Goal Status: Active Ulcer/skin breakdown will have a volume reduction of 80% by week 12 Date Initiated: 12/03/2019 Target Resolution Date: 03/04/2020 Goal Status: Active Interventions: Assess ulceration(s) every visit Treatment Activities: Referred to DME Raeford Brandenburg for dressing supplies : 12/03/2019 Topical wound management initiated : 12/03/2019 Notes: Electronic Signature(s) Signed: 12/10/2019 5:20:53 PM By: Grover Canavan Entered By: Grover Canavan on 12/10/2019 11:03:06 [name] ([id number]) -------------------------------------------------------------------------------- Pain Assessment Details Patient Name: [name] Date of Service: 12/10/2019 10:15 AM Medical Record Number: [id number] Patient Account Number: [id number] Date of Birth/Sex: [birth year] (74 y.o. M) Treating RN: Cornell Barman Primary Care Sebastian Lurz: [name] Other Clinician: Referring Adrienna Karis: [name] Treating Burrel Legrand/Extender: Melburn Hake, HOYT Weeks in Treatment: 1 Active Problems Location of Pain Severity and Description of Pain Patient Has Paino No Site Locations With Dressing Change: No Pain Management and Medication Current Pain  Management: Electronic Signature(s) Signed: 12/10/2019 2:14:51 PM By: Darci Needle Signed: 12/12/2019 11:13:21 AM By: Gretta Cool, BSN, RN, CWS, Kim RN, BSN Entered By: Darci Needle on 12/10/2019 10:28:59 [name] ([id number]) -------------------------------------------------------------------------------- Patient/Caregiver Education Details Patient Name: [name] Date of Service: 12/10/2019 10:15 AM Medical Record Number: [id number] Patient Account Number: [id number] Date of Birth/Gender: [birth year] (74 y.o.  M) Treating RN: Grover Canavan Primary Care Physician: [name] Other Clinician: Referring Physician: [name] Treating Physician/Extender: Sharalyn Ink in Treatment: 1 Education Assessment Education Provided To: Patient Education Topics Provided Wound/Skin Impairment: Handouts: Caring for Your Ulcer Methods: Explain/Verbal Responses: State content correctly Electronic Signature(s) Signed: 12/10/2019 5:20:53 PM By: Grover Canavan Entered By: Grover Canavan on 12/10/2019 11:12:13 [name] ([id number]) -------------------------------------------------------------------------------- Wound Assessment Details Patient Name: [name] Date of Service: 12/10/2019 10:15 AM Medical Record Number: [id number] Patient Account Number: [id number] Date of Birth/Sex: 10/07/1945 (74 y.o. M) Treating RN: Cornell Barman Primary Care Jaxtin Raimondo: [name] Other Clinician: Referring Terron Merfeld: [name] Treating Ashantia Amaral/Extender: Melburn Hake, HOYT Weeks in Treatment: 1 Wound Status Wound Number: 7 Primary Diabetic Wound/Ulcer of the Lower Extremity Etiology: Wound Location: Left, Lateral, Dorsal Foot Wound Open Wounding Event: Surgical Injury Status: Date Acquired: 11/23/2019 Comorbid Cataracts, Middle ear problems, Sleep Apnea, Weeks Of Treatment: 1 History: Hypertension, Type II Diabetes, Gout, Osteoarthritis, Clustered Wound: No Neuropathy, Received Chemotherapy Photos Wound Measurements Length: (cm) 2.4 Width: (cm) 3.6 Depth: (cm) 0.5 Area: (cm) 6.786 Volume: (cm) 3.393 % Reduction in Area: -6.7% % Reduction in Volume: -6.7% Epithelialization: Small (1-33%) Wound Description Classification: Grade 3 Wound Margin: Flat and Intact Exudate Amount: Medium Exudate Type: Serosanguineous Exudate Color: red, brown Foul Odor After Cleansing: No Slough/Fibrino Yes Wound Bed Granulation Amount: Small (1-33%) Exposed  Structure Granulation Quality: Pink Fascia Exposed: No Necrotic Amount: Large (67-100%) Fat Layer (Subcutaneous Tissue) Exposed: Yes Necrotic Quality: Adherent Slough Tendon Exposed: No Muscle Exposed: No Joint Exposed: No Bone Exposed: No Treatment Notes Wound #7 (Left, Lateral, Dorsal Foot) Electronic Signature(s) Signed: 12/10/2019 2:14:51 PM By: Darci Needle Signed: 12/12/2019 11:13:21 AM By: Gretta Cool, BSN, RN, CWS, Kim RN, BSN Entered By: Darci Needle on 12/10/2019 10:39:46 DEON, DUER ([name], [name] ([id number]) -------------------------------------------------------------------------------- Wound Assessment Details Patient Name: [name] Date of Service: 12/10/2019 10:15 AM Medical Record Number: [id number] Patient Account Number: [id number] Date of Birth/Sex: [birth year] (74 y.o. M) Treating RN: Cornell Barman Primary Care Juanita Devincent: [name] Other Clinician: Referring Emmalin Jaquess: [name] Treating Leita Lindbloom/Extender: Melburn Hake, HOYT Weeks in Treatment: 1 Wound Status Wound Number: 8 Primary Diabetic Wound/Ulcer of the Lower Extremity Etiology: Wound Location: Left, Distal Toe Great Secondary Pressure Ulcer Wounding Event: Pressure Injury Etiology: Date Acquired: 09/03/2019 Wound Open Weeks Of Treatment: 1 Status: Clustered Wound: Yes Comorbid Cataracts, Middle ear problems, Sleep Apnea, History: Hypertension, Type II Diabetes, Gout, Osteoarthritis, Neuropathy, Received Chemotherapy Photos Wound Measurements Length: (cm) 0.5 Width: (cm) 0.7 Depth: (cm) 0.1 Area: (cm) 0.275 Volume: (cm) 0.027 % Reduction in Area: 16.7% % Reduction in Volume: 18.2% Wound Description Classification: Grade 1 Treatment Notes Wound #8 (Left, Distal Toe Great) Electronic Signature(s) Signed: 12/10/2019 2:14:51 PM By: Darci Needle Signed: 12/12/2019 11:13:21 AM By: Gretta Cool, BSN, RN, CWS, Kim RN, BSN Entered By: Darci Needle on  12/10/2019 10:40:06 [name] ([id number]) -------------------------------------------------------------------------------- [place] Details Patient Name: [name] Date of Service: 12/10/2019 10:15 AM Medical Record Number: [id number] Patient Account Number: [id number] Date of Birth/Sex: [birth year] (74 y.o. M) Treating RN: Cornell Barman Primary Care  Filomena Pokorney: [name] Other Clinician: Referring Hannah Crill: [name] Treating Naftoli Penny/Extender: [name], HOYT Weeks in Treatment: 1 Vital Signs Time Taken: 10:20 Temperature (F): 97.7 Height (in): 71 Pulse (bpm): 90 Weight (lbs): 190 Respiratory Rate (breaths/min): 18 Body Mass Index (BMI): 26.5 Blood Pressure (mmHg): 138/70 Reference Range: 80 - 120 mg / dl Electronic Signature(s) Signed: 12/10/2019 2:14:51 PM By: Darci Needle Entered By: Darci Needle on 12/10/2019 10:28:47

## 2019-12-13 ENCOUNTER — Inpatient Hospital Stay: Payer: Medicare Other

## 2019-12-13 ENCOUNTER — Other Ambulatory Visit: Payer: Self-pay

## 2019-12-13 VITALS — BP 153/89 | HR 87 | Temp 97.7°F | Resp 16

## 2019-12-13 DIAGNOSIS — Z5189 Encounter for other specified aftercare: Secondary | ICD-10-CM | POA: Diagnosis not present

## 2019-12-13 DIAGNOSIS — C3431 Malignant neoplasm of lower lobe, right bronchus or lung: Secondary | ICD-10-CM | POA: Diagnosis not present

## 2019-12-13 DIAGNOSIS — C3491 Malignant neoplasm of unspecified part of right bronchus or lung: Secondary | ICD-10-CM

## 2019-12-13 DIAGNOSIS — Z5111 Encounter for antineoplastic chemotherapy: Secondary | ICD-10-CM | POA: Diagnosis not present

## 2019-12-13 MED ORDER — SODIUM CHLORIDE 0.9 % IV SOLN
97.0000 mg/m2 | Freq: Once | INTRAVENOUS | Status: AC
  Administered 2019-12-13: 200 mg via INTRAVENOUS
  Filled 2019-12-13: qty 10

## 2019-12-13 MED ORDER — SODIUM CHLORIDE 0.9 % IV SOLN
10.0000 mg | Freq: Once | INTRAVENOUS | Status: AC
  Administered 2019-12-13: 10 mg via INTRAVENOUS
  Filled 2019-12-13: qty 10

## 2019-12-13 MED ORDER — SODIUM CHLORIDE 0.9% FLUSH
10.0000 mL | INTRAVENOUS | Status: DC | PRN
  Administered 2019-12-13: 10 mL
  Filled 2019-12-13: qty 10

## 2019-12-13 MED ORDER — SODIUM CHLORIDE 0.9 % IV SOLN
Freq: Once | INTRAVENOUS | Status: AC
  Filled 2019-12-13: qty 250

## 2019-12-13 MED ORDER — HEPARIN SOD (PORK) LOCK FLUSH 100 UNIT/ML IV SOLN
500.0000 [IU] | Freq: Once | INTRAVENOUS | Status: AC | PRN
  Administered 2019-12-13: 500 [IU]
  Filled 2019-12-13: qty 5

## 2019-12-13 NOTE — Patient Instructions (Signed)
[facility] Discharge Instructions for Patients Receiving Chemotherapy  Today you received the following chemotherapy agents: Etoposide   To help prevent nausea and vomiting after your treatment, we encourage you to take your nausea medication as directed.    If you develop nausea and vomiting that is not controlled by your nausea medication, call the clinic.   BELOW ARE SYMPTOMS THAT SHOULD BE REPORTED IMMEDIATELY:  *FEVER GREATER THAN 100.5 F  *CHILLS WITH OR WITHOUT FEVER  NAUSEA AND VOMITING THAT IS NOT CONTROLLED WITH YOUR NAUSEA MEDICATION  *UNUSUAL SHORTNESS OF BREATH  *UNUSUAL BRUISING OR BLEEDING  TENDERNESS IN MOUTH AND THROAT WITH OR WITHOUT PRESENCE OF ULCERS  *URINARY PROBLEMS  *BOWEL PROBLEMS  UNUSUAL RASH Items with * indicate a potential emergency and should be followed up as soon as possible.  Feel free to call the clinic should you have any questions or concerns. The clinic phone number is [phone].  Please show the [facility] at check-in to the Emergency Department and triage nurse.

## 2019-12-14 DIAGNOSIS — J45909 Unspecified asthma, uncomplicated: Secondary | ICD-10-CM | POA: Diagnosis not present

## 2019-12-14 DIAGNOSIS — Z9181 History of falling: Secondary | ICD-10-CM | POA: Diagnosis not present

## 2019-12-14 DIAGNOSIS — Z794 Long term (current) use of insulin: Secondary | ICD-10-CM | POA: Diagnosis not present

## 2019-12-14 DIAGNOSIS — E1165 Type 2 diabetes mellitus with hyperglycemia: Secondary | ICD-10-CM | POA: Diagnosis not present

## 2019-12-14 DIAGNOSIS — N1831 Chronic kidney disease, stage 3a: Secondary | ICD-10-CM | POA: Diagnosis not present

## 2019-12-14 DIAGNOSIS — R000 Tachycardia, unspecified: Secondary | ICD-10-CM | POA: Diagnosis not present

## 2019-12-14 DIAGNOSIS — G4733 Obstructive sleep apnea (adult) (pediatric): Secondary | ICD-10-CM | POA: Diagnosis not present

## 2019-12-14 DIAGNOSIS — E041 Nontoxic single thyroid nodule: Secondary | ICD-10-CM | POA: Diagnosis not present

## 2019-12-14 DIAGNOSIS — I129 Hypertensive chronic kidney disease with stage 1 through stage 4 chronic kidney disease, or unspecified chronic kidney disease: Secondary | ICD-10-CM | POA: Diagnosis not present

## 2019-12-14 DIAGNOSIS — W010XXD Fall on same level from slipping, tripping and stumbling without subsequent striking against object, subsequent encounter: Secondary | ICD-10-CM | POA: Diagnosis not present

## 2019-12-14 DIAGNOSIS — L97429 Non-pressure chronic ulcer of left heel and midfoot with unspecified severity: Secondary | ICD-10-CM | POA: Diagnosis not present

## 2019-12-14 DIAGNOSIS — E11621 Type 2 diabetes mellitus with foot ulcer: Secondary | ICD-10-CM | POA: Diagnosis not present

## 2019-12-14 DIAGNOSIS — L02612 Cutaneous abscess of left foot: Secondary | ICD-10-CM | POA: Diagnosis not present

## 2019-12-14 DIAGNOSIS — E1142 Type 2 diabetes mellitus with diabetic polyneuropathy: Secondary | ICD-10-CM | POA: Diagnosis not present

## 2019-12-14 DIAGNOSIS — Z87891 Personal history of nicotine dependence: Secondary | ICD-10-CM | POA: Diagnosis not present

## 2019-12-14 DIAGNOSIS — C3491 Malignant neoplasm of unspecified part of right bronchus or lung: Secondary | ICD-10-CM | POA: Diagnosis not present

## 2019-12-14 DIAGNOSIS — S2232XD Fracture of one rib, left side, subsequent encounter for fracture with routine healing: Secondary | ICD-10-CM | POA: Diagnosis not present

## 2019-12-15 ENCOUNTER — Inpatient Hospital Stay: Payer: Medicare Other

## 2019-12-15 ENCOUNTER — Other Ambulatory Visit: Payer: Self-pay

## 2019-12-15 VITALS — BP 152/88 | HR 120 | Temp 98.3°F | Resp 17

## 2019-12-15 DIAGNOSIS — Z5111 Encounter for antineoplastic chemotherapy: Secondary | ICD-10-CM | POA: Diagnosis not present

## 2019-12-15 DIAGNOSIS — C3431 Malignant neoplasm of lower lobe, right bronchus or lung: Secondary | ICD-10-CM | POA: Diagnosis not present

## 2019-12-15 DIAGNOSIS — Z5189 Encounter for other specified aftercare: Secondary | ICD-10-CM | POA: Diagnosis not present

## 2019-12-15 MED ORDER — PEGFILGRASTIM-CBQV 6 MG/0.6ML ~~LOC~~ SOSY
6.0000 mg | PREFILLED_SYRINGE | Freq: Once | SUBCUTANEOUS | Status: AC
  Administered 2019-12-15: 6 mg via SUBCUTANEOUS

## 2019-12-17 DIAGNOSIS — Z794 Long term (current) use of insulin: Secondary | ICD-10-CM | POA: Diagnosis not present

## 2019-12-17 DIAGNOSIS — S2232XD Fracture of one rib, left side, subsequent encounter for fracture with routine healing: Secondary | ICD-10-CM | POA: Diagnosis not present

## 2019-12-17 DIAGNOSIS — I129 Hypertensive chronic kidney disease with stage 1 through stage 4 chronic kidney disease, or unspecified chronic kidney disease: Secondary | ICD-10-CM | POA: Diagnosis not present

## 2019-12-17 DIAGNOSIS — E11621 Type 2 diabetes mellitus with foot ulcer: Secondary | ICD-10-CM | POA: Diagnosis not present

## 2019-12-17 DIAGNOSIS — E1165 Type 2 diabetes mellitus with hyperglycemia: Secondary | ICD-10-CM | POA: Diagnosis not present

## 2019-12-17 DIAGNOSIS — J45909 Unspecified asthma, uncomplicated: Secondary | ICD-10-CM | POA: Diagnosis not present

## 2019-12-17 DIAGNOSIS — E1142 Type 2 diabetes mellitus with diabetic polyneuropathy: Secondary | ICD-10-CM | POA: Diagnosis not present

## 2019-12-17 DIAGNOSIS — E041 Nontoxic single thyroid nodule: Secondary | ICD-10-CM | POA: Diagnosis not present

## 2019-12-17 DIAGNOSIS — W010XXD Fall on same level from slipping, tripping and stumbling without subsequent striking against object, subsequent encounter: Secondary | ICD-10-CM | POA: Diagnosis not present

## 2019-12-17 DIAGNOSIS — L97429 Non-pressure chronic ulcer of left heel and midfoot with unspecified severity: Secondary | ICD-10-CM | POA: Diagnosis not present

## 2019-12-17 DIAGNOSIS — Z87891 Personal history of nicotine dependence: Secondary | ICD-10-CM | POA: Diagnosis not present

## 2019-12-17 DIAGNOSIS — L02612 Cutaneous abscess of left foot: Secondary | ICD-10-CM | POA: Diagnosis not present

## 2019-12-17 DIAGNOSIS — C3491 Malignant neoplasm of unspecified part of right bronchus or lung: Secondary | ICD-10-CM | POA: Diagnosis not present

## 2019-12-17 DIAGNOSIS — Z9181 History of falling: Secondary | ICD-10-CM | POA: Diagnosis not present

## 2019-12-17 DIAGNOSIS — R000 Tachycardia, unspecified: Secondary | ICD-10-CM | POA: Diagnosis not present

## 2019-12-17 DIAGNOSIS — G4733 Obstructive sleep apnea (adult) (pediatric): Secondary | ICD-10-CM | POA: Diagnosis not present

## 2019-12-17 DIAGNOSIS — N1831 Chronic kidney disease, stage 3a: Secondary | ICD-10-CM | POA: Diagnosis not present

## 2019-12-18 ENCOUNTER — Other Ambulatory Visit: Payer: Medicare Other

## 2019-12-18 ENCOUNTER — Other Ambulatory Visit: Payer: Self-pay | Admitting: *Deleted

## 2019-12-18 ENCOUNTER — Inpatient Hospital Stay: Payer: Medicare Other

## 2019-12-18 ENCOUNTER — Other Ambulatory Visit: Payer: Self-pay | Admitting: Physician Assistant

## 2019-12-18 ENCOUNTER — Other Ambulatory Visit: Payer: Self-pay

## 2019-12-18 ENCOUNTER — Telehealth: Payer: Self-pay | Admitting: Physician Assistant

## 2019-12-18 DIAGNOSIS — C3490 Malignant neoplasm of unspecified part of unspecified bronchus or lung: Secondary | ICD-10-CM

## 2019-12-18 DIAGNOSIS — D649 Anemia, unspecified: Secondary | ICD-10-CM

## 2019-12-18 DIAGNOSIS — Z5111 Encounter for antineoplastic chemotherapy: Secondary | ICD-10-CM | POA: Diagnosis not present

## 2019-12-18 DIAGNOSIS — C3491 Malignant neoplasm of unspecified part of right bronchus or lung: Secondary | ICD-10-CM

## 2019-12-18 DIAGNOSIS — Z5189 Encounter for other specified aftercare: Secondary | ICD-10-CM | POA: Diagnosis not present

## 2019-12-18 DIAGNOSIS — C3431 Malignant neoplasm of lower lobe, right bronchus or lung: Secondary | ICD-10-CM | POA: Diagnosis not present

## 2019-12-18 LAB — CMP (CANCER CENTER ONLY)
ALT: 8 U/L (ref 0–44)
AST: 8 U/L — ABNORMAL LOW (ref 15–41)
Albumin: 2.7 g/dL — ABNORMAL LOW (ref 3.5–5.0)
Alkaline Phosphatase: 124 U/L (ref 38–126)
Anion gap: 6 (ref 5–15)
BUN: 22 mg/dL (ref 8–23)
CO2: 27 mmol/L (ref 22–32)
Calcium: 8.5 mg/dL — ABNORMAL LOW (ref 8.9–10.3)
Chloride: 97 mmol/L — ABNORMAL LOW (ref 98–111)
Creatinine: 1.37 mg/dL — ABNORMAL HIGH (ref 0.61–1.24)
GFR, Est AFR Am: 58 mL/min — ABNORMAL LOW (ref >60)
GFR, Estimated: 50 mL/min — ABNORMAL LOW (ref >60)
Glucose, Bld: 387 mg/dL — ABNORMAL HIGH (ref 70–99)
Potassium: 4.7 mmol/L (ref 3.5–5.1)
Sodium: 130 mmol/L — ABNORMAL LOW (ref 135–145)
Total Bilirubin: 0.5 mg/dL (ref 0.3–1.2)
Total Protein: 6.2 g/dL — ABNORMAL LOW (ref 6.5–8.1)

## 2019-12-18 LAB — CBC WITH DIFFERENTIAL (CANCER CENTER ONLY)
Abs Immature Granulocytes: 0.2 10*3/uL — ABNORMAL HIGH (ref 0.00–0.07)
Basophils Absolute: 0 10*3/uL (ref 0.0–0.1)
Basophils Relative: 0 %
Eosinophils Absolute: 0 10*3/uL (ref 0.0–0.5)
Eosinophils Relative: 0 %
HCT: 23.8 % — ABNORMAL LOW (ref 39.0–52.0)
Hemoglobin: 8.2 g/dL — ABNORMAL LOW (ref 13.0–17.0)
Immature Granulocytes: 2 %
Lymphocytes Relative: 4 %
Lymphs Abs: 0.4 10*3/uL — ABNORMAL LOW (ref 0.7–4.0)
MCH: 28.7 pg (ref 26.0–34.0)
MCHC: 34.5 g/dL (ref 30.0–36.0)
MCV: 83.2 fL (ref 80.0–100.0)
Monocytes Absolute: 0.3 10*3/uL (ref 0.1–1.0)
Monocytes Relative: 3 %
Neutro Abs: 8.3 10*3/uL — ABNORMAL HIGH (ref 1.7–7.7)
Neutrophils Relative %: 91 %
Platelet Count: 103 10*3/uL — ABNORMAL LOW (ref 150–400)
RBC: 2.86 MIL/uL — ABNORMAL LOW (ref 4.22–5.81)
RDW: 15 % (ref 11.5–15.5)
WBC Count: 9.2 10*3/uL (ref 4.0–10.5)
nRBC: 0 % (ref 0.0–0.2)

## 2019-12-18 MED ORDER — SODIUM CHLORIDE 0.9% FLUSH
10.0000 mL | INTRAVENOUS | Status: DC | PRN
  Administered 2019-12-18: 10 mL
  Filled 2019-12-18: qty 10

## 2019-12-18 MED ORDER — HEPARIN SOD (PORK) LOCK FLUSH 100 UNIT/ML IV SOLN
500.0000 [IU] | Freq: Once | INTRAVENOUS | Status: AC | PRN
  Administered 2019-12-18: 500 [IU]
  Filled 2019-12-18: qty 5

## 2019-12-18 NOTE — Patient Outreach (Signed)
[facility]) Care Management  12/18/2019  [name] Nov 13, 1945 [id number]     Monthly call placed to member to follow up on ongoing wound healing and progression through cancer treatment.  He report he is managing his treatments well, denies complications.  State he still has the wound vac on his foot, home health nurse coming every other day for dressing changes, minimal draninage.  He will follow up with the wound care center next Monday.  Denies any signs/symptoms of infection.  No urgent issues at this time, encouraged to contact this care manager with questions.  Will follow up within the next month.   Goals Addressed              This Visit's Progress   .  Finish radiation treatment and get better (pt-stated)   On track     [place] (see longitudinal plan of care for additional care plan information)  Current Barriers:  [name] Kitchen Knowledge Deficits related to new cancer diagnosis  Nurse Case Manager Clinical Goal(s):  [name] Kitchen Over the next 28 days, patient will verbalize understanding of plan for cancer treatments . Over the next 31 days, patient will not experience hospital admission. Hospital Admissions in last 6 months = 2 . Over the next 28 days, patient will attend all scheduled medical appointments: with cancer center  Interventions:  . Inter-disciplinary care team collaboration (see longitudinal plan of care) . Evaluation of current treatment plan related to radiation therapy and patient's adherence to plan as established by provider. . Discussed plans with patient for ongoing care management follow up and provided patient with direct contact information for care management team . Reviewed scheduled/upcoming provider appointments including: cancer radiation therapy  Patient Self Care Activities:  . Attends all scheduled provider appointments . Performs ADL's independently . Calls provider office for new concerns or questions  Initial goal  documentation       [name], RN, MSN [place] [phone]

## 2019-12-18 NOTE — Telephone Encounter (Signed)
I called the patient about his blood sugar on his routine labs. He states since returning home he has taken insulin. I advised him to monitor his blood sugar closely at home several times a day. His hbg today was 8.2. I have added an extra sample to blood bank to be drawn during his weekly labs next week. Advised him to not remove the blue bracelet next week unless we confirm with him that he does not require a blood transfusion. He expressed understanding.

## 2019-12-19 DIAGNOSIS — I129 Hypertensive chronic kidney disease with stage 1 through stage 4 chronic kidney disease, or unspecified chronic kidney disease: Secondary | ICD-10-CM | POA: Diagnosis not present

## 2019-12-19 DIAGNOSIS — G4733 Obstructive sleep apnea (adult) (pediatric): Secondary | ICD-10-CM | POA: Diagnosis not present

## 2019-12-19 DIAGNOSIS — W010XXD Fall on same level from slipping, tripping and stumbling without subsequent striking against object, subsequent encounter: Secondary | ICD-10-CM | POA: Diagnosis not present

## 2019-12-19 DIAGNOSIS — E11621 Type 2 diabetes mellitus with foot ulcer: Secondary | ICD-10-CM | POA: Diagnosis not present

## 2019-12-19 DIAGNOSIS — Z9181 History of falling: Secondary | ICD-10-CM | POA: Diagnosis not present

## 2019-12-19 DIAGNOSIS — E1142 Type 2 diabetes mellitus with diabetic polyneuropathy: Secondary | ICD-10-CM | POA: Diagnosis not present

## 2019-12-19 DIAGNOSIS — J45909 Unspecified asthma, uncomplicated: Secondary | ICD-10-CM | POA: Diagnosis not present

## 2019-12-19 DIAGNOSIS — C3491 Malignant neoplasm of unspecified part of right bronchus or lung: Secondary | ICD-10-CM | POA: Diagnosis not present

## 2019-12-19 DIAGNOSIS — R000 Tachycardia, unspecified: Secondary | ICD-10-CM | POA: Diagnosis not present

## 2019-12-19 DIAGNOSIS — N1831 Chronic kidney disease, stage 3a: Secondary | ICD-10-CM | POA: Diagnosis not present

## 2019-12-19 DIAGNOSIS — S2232XD Fracture of one rib, left side, subsequent encounter for fracture with routine healing: Secondary | ICD-10-CM | POA: Diagnosis not present

## 2019-12-19 DIAGNOSIS — E041 Nontoxic single thyroid nodule: Secondary | ICD-10-CM | POA: Diagnosis not present

## 2019-12-19 DIAGNOSIS — E1165 Type 2 diabetes mellitus with hyperglycemia: Secondary | ICD-10-CM | POA: Diagnosis not present

## 2019-12-19 DIAGNOSIS — Z794 Long term (current) use of insulin: Secondary | ICD-10-CM | POA: Diagnosis not present

## 2019-12-19 DIAGNOSIS — L97429 Non-pressure chronic ulcer of left heel and midfoot with unspecified severity: Secondary | ICD-10-CM | POA: Diagnosis not present

## 2019-12-19 DIAGNOSIS — Z87891 Personal history of nicotine dependence: Secondary | ICD-10-CM | POA: Diagnosis not present

## 2019-12-19 DIAGNOSIS — L02612 Cutaneous abscess of left foot: Secondary | ICD-10-CM | POA: Diagnosis not present

## 2019-12-20 NOTE — Chronic Care Management (AMB) (Signed)
Chronic Care Management Pharmacy  Name: [name]  [name]: [id number] DOB: June 21, 1945  Chief Complaint/ HPI  [name],  74 y.o. , male presents for their Initial CCM visit with the clinical pharmacist via telephone due to COVID-19 Pandemic.  PCP : [name], MD  Their chronic conditions include: hypertension, allergic rhiniits, GERD, diabetes type 2, osteoarthritis, major depressive disorder, hypertriglyceridemia.   Office Visits: 10/10/2019 - ordered lung CT revealed possible cancer in lungs. PET scan ordered. Referral to oncology.  09/26/2019 - testosterone injection and nystatin prescribed for possible thrush. [name] Kitchen  09/20/2019 - trying Singulair in addition to Lamar. Possible change from Allegra to Xyzal. Stop prednisone if CBGs not coming down. Can temporarily increase Novolin N up 5 units twice daily.  09/12/2019 - testosterone injection.  09/06/2019 - video visit for sinus pressure - treat with low dose prednisone taper.  08/29/2019 - testosterone injection.  08/15/2019 - testosterone injection.  08/02/2019 - testosterone injection.  07/18/2019 - bp stable. Diabetes is doing better. Flomax for urine.  07/04/2019 - testosterone injection.  Consult Visit: 12/11/2019 - Oncology - wound improving. Off of antibiotics.  12/10/2019 - Wound Care visit. Continue Wound Vac changing M/W/F.  12/03/2019 - Oncology - currently on amoxicillin and Cipro for foot wound. Glucose 490 on routine labs. Patient did not take his insulin today. Counseled to glucose monitoring.  12/03/2019 - Wound Care visit after surgical debridement.  11/22/2019 - ED to hospital admission for sepsis for foot wound. Surgical debridement and wound vac.  11/19/2019 - Oncology Visit. Insulin given in office. Arranging for port-a-cath placement.  11/15/2019 - ED visit for 2 syncopal episodes. IV fluids given. EKG is normal sinus rhythm.  11/05/2019 - oncology visit - assess treatment. Neulasta support.  Daily treatment administered.  10/30/2019  - oncology treatment with carboplatin and taxol added.  10/23/2019 - oncology visit - daily treatment. Chemo treatment ordered. Compazine 10 mg every 6 hours prn nausea.  10/17/2019 - endobronchial ultrasound.  10/16/2019 - Endocrinology - increase doses to 50-40-50 units  U500.  10/12/2019 - ED to Hospital admission.  10/08/2019 - Wound Care visit. fall due to getting up without brace or walker.  10/03/2019 [name] training appointment.  09/24/2019 - Wound Care visit - wound debridement.  09/17/2019 - Wound Care visit - wound improving with ampicillin treatment.  09/17/2019 - Endocrinology - patient is off Ozempic due to cost in doughnut hole. Patient refusing referral to nutrition. CGM is too expensive and patient's neuropathy limits checking ability. Patient's diet consists of dried fruit and sweetened fruit.  09/11/2019 - Wound Care visit - antibiotic for pus/blood coming from callus.  08/30/2019 - Wound Care visit - patient did not tolerate offloading shoe due to balance.  08/20/2019 - Wound Care visit - patient's wound is slowly improving.  08/13/2019 - Wound Care visit - wound appears to be doing well.  08/06/2019 - Wound Care visit - debridement of buildup over callus.  07/23/2019 - Wound Care visit - wound appears to be healing.  07/16/2019 - Wound Care visit - no signs of infection.  07/09/2019 - Wound Care visit - no signs of infection.  07/02/2019 - Wound Care visit - debridement.  06/25/2019 - Wound Care visit - debridement.  Medications: Outpatient Encounter Medications as of 12/24/2019  Medication Sig Note  . acetaminophen (QC ACETAMINOPHEN 8HR ARTH PAIN) 650 MG CR tablet Take 650 mg by mouth every 8 (eight) hours as needed for pain.   [name] Kitchen ampicillin (PRINCIPEN) 500 MG  capsule Take 500 mg by mouth daily as needed (flare up).    . Ascorbic Acid (VITAMIN C) 1000 MG tablet Take 1,000 mg by mouth daily.   [name] Kitchen aspirin EC 81 MG tablet Take 81  mg by mouth daily. Swallow whole.   [name] Kitchen atenolol (TENORMIN) 50 MG tablet Take 50 mg by mouth daily.   . cholecalciferol (VITAMIN D) 1000 UNITS tablet Take 2,000 Units by mouth daily.    [name] Kitchen donepezil (ARICEPT) 10 MG tablet TAKE 1 TABLET BY MOUTH AT BEDTIME (Patient taking differently: Take 10 mg by mouth at bedtime. )   . fluticasone (FLONASE) 50 MCG/ACT nasal spray PLACE 2 SPRAYS IN EACH NOSTRIL DAILY (Patient taking differently: Place 2 sprays into both nostrils daily as needed for allergies or rhinitis. )   . Hyprom-Naphaz-Polysorb-Zn Sulf (CLEAR EYES COMPLETE OP) Place 1 drop into both eyes daily as needed (dry eyes).   . Insulin Pen Needle (PEN NEEDLES) 31G X 5 MM MISC 1 each by Does not apply route 3 (three) times daily. To inject insulin E11.42   . insulin regular human CONCENTRATED (HUMULIN R U-500 KWIKPEN) 500 UNIT/ML kwikpen Inject under skin 80-90 units before b'fast, 60 units before lunch, 80-90 units vefore dinner (Patient taking differently: Inject 20-40 Units into the skin See admin instructions. Takes 35-40 units before breakfast, 20 units at lunch and 35-40 units at night) 11/22/2019: 25 units   . lisinopril (ZESTRIL) 10 MG tablet Take 10 mg by mouth daily.   . memantine (NAMENDA) 10 MG tablet TAKE 1 TABLET BY MOUTH TWICE A DAY (Patient taking differently: Take 10 mg by mouth 2 (two) times daily. )   . montelukast (SINGULAIR) 10 MG tablet Take 1 tablet (10 mg total) by mouth at bedtime.   [name] Kitchen omeprazole (PRILOSEC) 40 MG capsule Take 1 capsule (40 mg total) by mouth 2 (two) times daily before a meal. (Patient taking differently: Take 20 mg by mouth daily. Taking OTC daily.)   . prochlorperazine (COMPAZINE) 10 MG tablet Take 1 tablet (10 mg total) by mouth every 6 (six) hours as needed. (Patient taking differently: Take 10 mg by mouth every 6 (six) hours as needed for nausea. )   . simvastatin (ZOCOR) 40 MG tablet TAKE 1 TABLET BY MOUTH AT BEDTIME (Patient taking differently: Take 40 mg by mouth  every evening. )   . venlafaxine XR (EFFEXOR-XR) 75 MG 24 hr capsule TAKE 1 CAPSULE BY MOUTH EVERY MORNING AND 2 CAPSULES AT BEDTIME   . amoxicillin (AMOXIL) 500 MG capsule Take 1 capsule (500 mg total) by mouth every 8 (eight) hours. (Patient not taking: Reported on 12/24/2019)   . ciprofloxacin (CIPRO) 500 MG tablet Take 1 tablet (500 mg total) by mouth 2 (two) times daily. (Patient not taking: Reported on 12/24/2019)   . guaiFENesin-dextromethorphan (ROBITUSSIN DM) 100-10 MG/5ML syrup Take 5 mLs by mouth every 6 (six) hours as needed for cough. (Patient not taking: Reported on 12/24/2019)   . lidocaine-prilocaine (EMLA) cream Apply 1 application topically as needed. (Patient not taking: Reported on 12/24/2019) 11/30/2019: Not started yet  . metoprolol succinate (TOPROL-XL) 25 MG 24 hr tablet Take 0.5 tablets (12.5 mg total) by mouth daily. (Patient not taking: Reported on 12/24/2019)   . potassium chloride SA (KLOR-CON) 20 MEQ tablet Take 2 tablets (40 mEq total) by mouth 2 (two) times daily. (Patient not taking: Reported on 12/24/2019)   . sucralfate (CARAFATE) 1 g tablet Take 1 tablet (1 g total) by mouth 4 (four) times daily.  Dissolve each tablet in 15 cc water before use. (Patient not taking: Reported on 12/24/2019)   . testosterone cypionate (DEPOTESTOSTERONE CYPIONATE) 200 MG/ML injection Inject 200 mg into the muscle every 14 (fourteen) days. (Patient not taking: Reported on 12/24/2019)    No facility-administered encounter medications on file as of 12/24/2019.   Allergies  Allergen Reactions  . Tetracycline Itching and Rash   SDOH Screenings   Alcohol Screen:   . Last Alcohol Screening Score (AUDIT): Not on file  Depression (PHQ2-9): Low Risk   . PHQ-2 Score: 0  Financial Resource Strain: Low Risk   . Difficulty of Paying Living Expenses: Not hard at all  Food Insecurity: No Food Insecurity  . Worried About Charity fundraiser in the Last Year: Never true  . Ran Out of Food in the Last  Year: Never true  Housing: Low Risk   . Last Housing Risk Score: 0  Physical Activity: Inactive  . Days of Exercise per Week: 0 days  . Minutes of Exercise per Session: 0 min  Social Connections:   . Frequency of Communication with Friends and Family: Not on file  . Frequency of Social Gatherings with Friends and Family: Not on file  . Attends Religious Services: Not on file  . Active Member of Clubs or Organizations: Not on file  . Attends Archivist Meetings: Not on file  . Marital Status: Not on file  Stress: No Stress Concern Present  . Feeling of Stress : Not at all  Tobacco Use: Medium Risk  . Smoking Tobacco Use: Former Smoker  . Smokeless Tobacco Use: Never Used  Transportation Needs: No Transportation Needs  . Lack of Transportation (Medical): No  . Lack of Transportation (Non-Medical): No    Current Diagnosis/Assessment:  Goals Addressed   None     Diabetes   Recent Relevant Labs: Lab Results  Component Value Date/Time   HGBA1C 10.2 (H) 11/23/2019 02:33 AM   HGBA1C 10.6 (H) 10/10/2019 12:21 PM   MICROALBUR 57.6 (H) 01/03/2019 11:25 AM   MICROALBUR 34.0 (H) 12/29/2017 09:14 AM    Kidney Function Lab Results  Component Value Date/Time   CREATININE 1.37 (H) 12/18/2019 01:50 PM   CREATININE 1.33 (H) 12/11/2019 11:50 AM   CREATININE 1.26 11/16/2011 05:13 AM   GFR 45.06 (L) 10/10/2019 12:21 PM   GFRNONAA 50 (L) 12/18/2019 01:50 PM   GFRNONAA 59 (L) 11/16/2011 05:13 AM   GFRAA 58 (L) 12/18/2019 01:50 PM   GFRAA >60 11/16/2011 05:13 AM   K 4.7 12/18/2019 01:50 PM   K 3.7 12/11/2019 11:50 AM   K 4.1 11/16/2011 05:13 AM    Checking BG: 3x per Day  Recent FBG Readings: 226 mg/dL Recent 2hr PP BG readings:  230-240 mg/dL Recent HS BG readings: 280 mg/dL Patient has failed these meds in past: Ozempic, Humulin N, Humulin R Patient is currently uncontrolled on the following medications:   Humulin R U-500 35-40 units before breakfast, 20 units  before lunch, 35-40 units before dinner.   Insulin pen needle tid   Freestyle Libre CGM  Last diabetic Foot exam:  Lab Results  Component Value Date/Time   HMDIABEYEEXA Retinopathy (A) 08/13/2019 12:00 AM    Last diabetic Eye exam:  Lab Results  Component Value Date/Time   HMDIABFOOTEX 02/05/2010 09/16/2010 12:00 AM     We discussed: diet and exercise extensively. Using U-500 30 units TID. He doesn't like his fasting blood sugar less than 150 mg/dL. Is not sure endocrinology  wants to resume Ozempic due to lack of energy and weight loss already with treatment/cancer. Sometimes adds an fourth shot ~15 units of insulin when sugars are running really high. Denies any low blood sugars. Patient is willing to resume Ozempic if will help his blood sugars. He would like to see his next a1c ~8%. Discussed a1c of 8% is an average of ~183 mg/dL.   Nutrition: Has lost 12 lbs with cancer treatments. Eats an egg and toast at breakfast or oatmeal regular with cinnamon and butter. Eating a lot of fruit during his treatment. Lunch typically eats a sandwich. Eats vegetables at dinner (beans, potatoes, carrots, asparagus, broccoli, brussel sprouts). Drinks water with meals. Occasionally drinks diet ginger ale. Drinks gatorade zero once in a while. Discussed being mindful of fruit intake and carbohydrates to have better blood sugar control.   Exercise: uses 2 lbs weights with arms. Working legs while sitting in wheel chair each day. Hopes to be able to walk to get a1c down after foot wound heals.   Plan  Continue current medications and  Hypertension   BP today is:  <130/80  Office blood pressures are  BP Readings from Last 3 Encounters:  12/15/19 (!) 152/88  12/13/19 (!) 153/89  12/12/19 (!) 161/89    Patient has failed these meds in the past: metoprolol Patient is currently controlled on the following medications:   Atenolol 50 mg daily   Lisinopril 10 mg daily  Patient checks BP at home  daily  Patient home BP readings are ranging: 126/74 mmHg  We discussed diet and exercise extensively  Plan  Continue current medications   Hyperlipidemia   LDL goal < 70  Lipid Panel     Component Value Date/Time   CHOL 172 01/03/2019 1122   TRIG (H) 01/03/2019 1122    521.0 Triglyceride is over 400; calculations on Lipids are invalid.   HDL 31.40 (L) 01/03/2019 1122   LDLCALC 43 04/24/2009 0000   LDLDIRECT 82.0 01/03/2019 1122    Hepatic Function Latest Ref Rng & Units 12/18/2019 12/11/2019 12/03/2019  Total Protein 6.5 - 8.1 g/dL 6.2(L) 6.6 6.5  Albumin 3.5 - 5.0 g/dL 2.7(L) 2.8(L) 2.7(L)  AST 15 - 41 U/L 8(L) 9(L) 8(L)  ALT 0 - 44 U/L $Remo'8 7 7  'xnOhq$ Alk Phosphatase 38 - 126 U/L 124 103 98  Total Bilirubin 0.3 - 1.2 mg/dL 0.5 0.4 0.3  Bilirubin, Direct 0.0 - 0.3 mg/dL - - -     The 10-year ASCVD risk score Mikey Bussing DC Jr., et al., 2013) is: 60.2%   Values used to calculate the score:     Age: 3 years     Sex: Male     Is Non-Hispanic African American: No     Diabetic: Yes     Tobacco smoker: No     Systolic Blood Pressure: 818 mmHg     Is BP treated: Yes     HDL Cholesterol: 31.4 mg/dL     Total Cholesterol: 172 mg/dL   Patient has failed these meds in past: n/a Patient is currently uncontrolled on the following medications:  . Simvastatin 40 mg daily in the evening . Aspirin 81 mg daily   We discussed:  diet and exercise extensively. Discuss at next visit about possible benefit of changing therapy to high intensity statin (atorvastatin or rosuvastatin).   Plan  Continue current medications   Memory Loss   Patient has failed these meds in past: n/a Patient is currently controlled on the  following medications:  . Donepezil 10 mg daily at bedtime . Memantine 10 mg bid  We discussed:  Patient reports that his mom has alzheimer's disease and lives with him. His late wife was concerned with his memory issues and he was started on these medications. He denies any side  effects with therapy and states his memory is good.   Plan  Continue current medications  GERD   Patient has failed these meds in past: n/a  Patient is currently controlled on the following medications:  . Omeprazole 20 mg daily  We discussed: Patient endorses symptoms of acid reflux for years. Discussed benefits of elevating head of bed at night, small meals, avoiding tight waistbands. Recommended patient consider eating small meals throughout the day to control symptoms.   Plan  Continue current medications  Health Maintenance   Patient is currently controlled on the following medications:  . Acetaminophen 650 mg CR every 8 hours as needed - pain . Prochlorperazine 10 mg q6 hours prn nausea . Venlafaxine XR 75 mg every morning and 150 mg at bedtime - neuropathy/nerves . Vitamin C 1000 mg daily - supplementation . Vitamin D 1000 units daily - supplementation  . Flonase nasal spray - 2 sprays in each nostril daily prn allergies or rhinitis . Singulair 10 mg daily at bedtime - allergies . Clear eyes complete 1 drop in both eyes daily prn - dry eyes . Ampicillin 500 mg daily - per dermatology for skin condition  We discussed:  Patient reports good control of symptoms at this time.   Plan  Continue current medications  Vaccines   Reviewed and discussed patient's vaccination history.    Immunization History  Administered Date(s) Administered  . Fluad Quad(high Dose 65+) 12/20/2018  . Influenza Split 12/29/2010  . Influenza Whole 01/27/2009, 02/05/2010  . Influenza, High Dose Seasonal PF 02/03/2017, 12/21/2018  . Influenza, Seasonal, Injecte, Preservative Fre 06/14/2012  . Influenza,inj,Quad PF,6+ Mos 12/18/2012, 12/19/2013, 03/19/2015, 01/05/2016, 01/16/2018  . Influenza,inj,quad, With Preservative 01/10/2017  . PFIZER SARS-COV-2 Vaccination 06/15/2019, 07/18/2019  . Pneumococcal Conjugate-13 12/19/2013  . Pneumococcal Polysaccharide-23 10/25/2008, 08/26/2015  . Td  04/13/2007    Plan  Recommended patient receive annual flu vaccine in office.   Medication Management   Pt uses [facility] for all medications Uses pill box? Yes Pt endorses good compliance uses 2 week pill box.   We discussed: Patient does not have a problem with picking up medications at [facility]. He likes going there and does not see a benefit of packaging or delivery at this time.   Plan  Continue current medication management strategy    Follow up: CMA to reach out in 1 month to review blood sugar readings and status of patient assistance application. 2 month phone visit with Pharmacistt to review blood sugar readings/ updated labs and results of endocrinology visit.

## 2019-12-21 DIAGNOSIS — C3491 Malignant neoplasm of unspecified part of right bronchus or lung: Secondary | ICD-10-CM | POA: Diagnosis not present

## 2019-12-21 DIAGNOSIS — Z794 Long term (current) use of insulin: Secondary | ICD-10-CM | POA: Diagnosis not present

## 2019-12-21 DIAGNOSIS — S2232XD Fracture of one rib, left side, subsequent encounter for fracture with routine healing: Secondary | ICD-10-CM | POA: Diagnosis not present

## 2019-12-21 DIAGNOSIS — R000 Tachycardia, unspecified: Secondary | ICD-10-CM | POA: Diagnosis not present

## 2019-12-21 DIAGNOSIS — J45909 Unspecified asthma, uncomplicated: Secondary | ICD-10-CM | POA: Diagnosis not present

## 2019-12-21 DIAGNOSIS — G4733 Obstructive sleep apnea (adult) (pediatric): Secondary | ICD-10-CM | POA: Diagnosis not present

## 2019-12-21 DIAGNOSIS — W010XXD Fall on same level from slipping, tripping and stumbling without subsequent striking against object, subsequent encounter: Secondary | ICD-10-CM | POA: Diagnosis not present

## 2019-12-21 DIAGNOSIS — L02612 Cutaneous abscess of left foot: Secondary | ICD-10-CM | POA: Diagnosis not present

## 2019-12-21 DIAGNOSIS — E041 Nontoxic single thyroid nodule: Secondary | ICD-10-CM | POA: Diagnosis not present

## 2019-12-21 DIAGNOSIS — E1165 Type 2 diabetes mellitus with hyperglycemia: Secondary | ICD-10-CM | POA: Diagnosis not present

## 2019-12-21 DIAGNOSIS — N1831 Chronic kidney disease, stage 3a: Secondary | ICD-10-CM | POA: Diagnosis not present

## 2019-12-21 DIAGNOSIS — L97429 Non-pressure chronic ulcer of left heel and midfoot with unspecified severity: Secondary | ICD-10-CM | POA: Diagnosis not present

## 2019-12-21 DIAGNOSIS — Z87891 Personal history of nicotine dependence: Secondary | ICD-10-CM | POA: Diagnosis not present

## 2019-12-21 DIAGNOSIS — E1142 Type 2 diabetes mellitus with diabetic polyneuropathy: Secondary | ICD-10-CM | POA: Diagnosis not present

## 2019-12-21 DIAGNOSIS — Z9181 History of falling: Secondary | ICD-10-CM | POA: Diagnosis not present

## 2019-12-21 DIAGNOSIS — E11621 Type 2 diabetes mellitus with foot ulcer: Secondary | ICD-10-CM | POA: Diagnosis not present

## 2019-12-21 DIAGNOSIS — I129 Hypertensive chronic kidney disease with stage 1 through stage 4 chronic kidney disease, or unspecified chronic kidney disease: Secondary | ICD-10-CM | POA: Diagnosis not present

## 2019-12-21 NOTE — Progress Notes (Signed)
  Radiation Oncology         [phone] ________________________________  Name: [name] [name]: [id number]  Date: 11/28/2019  DOB: 08/24/1945  End of Treatment Note  Diagnosis:  Lung cancer     Indication for treatment::  curative       Radiation treatment dates:   10/18/19 - 11/28/19  Site/dose:   The patient was treated to the disease within the right lung initially to a dose of 37.5 Gy using a 3 field, 3-D conformal technique.  As the patient's staging was completed, additional radiation treatment to the lung was planned for an additional 21.6 Gray using a 4 field 3D conformal technique.  The final total dose of the lung therefore was 59.1 Gray.    Narrative: The patient tolerated radiation treatment relatively well.      Plan: The patient has completed radiation treatment. The patient will return to radiation oncology clinic for routine followup in one month. I advised the patient to call or return sooner if they have any questions or concerns related to their recovery or treatment. ________________________________  Jodelle Gross, M.D., Ph.D.

## 2019-12-24 ENCOUNTER — Ambulatory Visit: Payer: Medicare Other | Admitting: Internal Medicine

## 2019-12-24 ENCOUNTER — Other Ambulatory Visit: Payer: Medicare Other

## 2019-12-24 ENCOUNTER — Inpatient Hospital Stay: Payer: Medicare Other

## 2019-12-24 ENCOUNTER — Other Ambulatory Visit: Payer: Self-pay | Admitting: Radiation Oncology

## 2019-12-24 ENCOUNTER — Encounter: Payer: Medicare Other | Attending: Physician Assistant | Admitting: Physician Assistant

## 2019-12-24 ENCOUNTER — Telehealth: Payer: Self-pay | Admitting: Radiation Oncology

## 2019-12-24 ENCOUNTER — Ambulatory Visit: Payer: Medicare Other

## 2019-12-24 ENCOUNTER — Other Ambulatory Visit: Payer: Self-pay

## 2019-12-24 ENCOUNTER — Inpatient Hospital Stay (HOSPITAL_BASED_OUTPATIENT_CLINIC_OR_DEPARTMENT_OTHER): Payer: Medicare Other | Admitting: Internal Medicine

## 2019-12-24 ENCOUNTER — Other Ambulatory Visit: Payer: Self-pay | Admitting: *Deleted

## 2019-12-24 DIAGNOSIS — L97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scle: Secondary | ICD-10-CM | POA: Insufficient documentation

## 2019-12-24 DIAGNOSIS — E1142 Type 2 diabetes mellitus with diabetic polyneuropathy: Secondary | ICD-10-CM | POA: Diagnosis not present

## 2019-12-24 DIAGNOSIS — Z794 Long term (current) use of insulin: Secondary | ICD-10-CM | POA: Diagnosis not present

## 2019-12-24 DIAGNOSIS — C3491 Malignant neoplasm of unspecified part of right bronchus or lung: Secondary | ICD-10-CM

## 2019-12-24 DIAGNOSIS — S61511A Laceration without foreign body of right wrist, initial encounter: Secondary | ICD-10-CM | POA: Insufficient documentation

## 2019-12-24 DIAGNOSIS — C3490 Malignant neoplasm of unspecified part of unspecified bronchus or lung: Secondary | ICD-10-CM | POA: Insufficient documentation

## 2019-12-24 DIAGNOSIS — M21372 Foot drop, left foot: Secondary | ICD-10-CM | POA: Insufficient documentation

## 2019-12-24 DIAGNOSIS — Z5111 Encounter for antineoplastic chemotherapy: Secondary | ICD-10-CM | POA: Diagnosis not present

## 2019-12-24 DIAGNOSIS — D649 Anemia, unspecified: Secondary | ICD-10-CM

## 2019-12-24 DIAGNOSIS — W19XXXA Unspecified fall, initial encounter: Secondary | ICD-10-CM | POA: Diagnosis not present

## 2019-12-24 DIAGNOSIS — C3431 Malignant neoplasm of lower lobe, right bronchus or lung: Secondary | ICD-10-CM | POA: Diagnosis not present

## 2019-12-24 DIAGNOSIS — Z5189 Encounter for other specified aftercare: Secondary | ICD-10-CM | POA: Diagnosis not present

## 2019-12-24 DIAGNOSIS — E11621 Type 2 diabetes mellitus with foot ulcer: Secondary | ICD-10-CM | POA: Insufficient documentation

## 2019-12-24 DIAGNOSIS — L97522 Non-pressure chronic ulcer of other part of left foot with fat layer exposed: Secondary | ICD-10-CM | POA: Diagnosis not present

## 2019-12-24 DIAGNOSIS — IMO0002 Reserved for concepts with insufficient information to code with codable children: Secondary | ICD-10-CM

## 2019-12-24 DIAGNOSIS — I10 Essential (primary) hypertension: Secondary | ICD-10-CM

## 2019-12-24 LAB — CBC WITH DIFFERENTIAL (CANCER CENTER ONLY)
Abs Immature Granulocytes: 0.33 10*3/uL — ABNORMAL HIGH (ref 0.00–0.07)
Basophils Absolute: 0 10*3/uL (ref 0.0–0.1)
Basophils Relative: 0 %
Eosinophils Absolute: 0 10*3/uL (ref 0.0–0.5)
Eosinophils Relative: 1 %
HCT: 21.2 % — ABNORMAL LOW (ref 39.0–52.0)
Hemoglobin: 7.2 g/dL — ABNORMAL LOW (ref 13.0–17.0)
Immature Granulocytes: 6 %
Lymphocytes Relative: 11 %
Lymphs Abs: 0.6 10*3/uL — ABNORMAL LOW (ref 0.7–4.0)
MCH: 28.5 pg (ref 26.0–34.0)
MCHC: 34 g/dL (ref 30.0–36.0)
MCV: 83.8 fL (ref 80.0–100.0)
Monocytes Absolute: 0.7 10*3/uL (ref 0.1–1.0)
Monocytes Relative: 11 %
Neutro Abs: 4.1 10*3/uL (ref 1.7–7.7)
Neutrophils Relative %: 71 %
Platelet Count: 47 10*3/uL — ABNORMAL LOW (ref 150–400)
RBC: 2.53 MIL/uL — ABNORMAL LOW (ref 4.22–5.81)
RDW: 15.2 % (ref 11.5–15.5)
Smear Review: 2
WBC Count: 5.8 10*3/uL (ref 4.0–10.5)
nRBC: 0.3 % — ABNORMAL HIGH (ref 0.0–0.2)

## 2019-12-24 LAB — PREPARE RBC (CROSSMATCH)

## 2019-12-24 LAB — CMP (CANCER CENTER ONLY)
ALT: 8 U/L (ref 0–44)
AST: 10 U/L — ABNORMAL LOW (ref 15–41)
Albumin: 2.9 g/dL — ABNORMAL LOW (ref 3.5–5.0)
Alkaline Phosphatase: 106 U/L (ref 38–126)
Anion gap: 7 (ref 5–15)
BUN: 13 mg/dL (ref 8–23)
CO2: 28 mmol/L (ref 22–32)
Calcium: 8.4 mg/dL — ABNORMAL LOW (ref 8.9–10.3)
Chloride: 98 mmol/L (ref 98–111)
Creatinine: 1.34 mg/dL — ABNORMAL HIGH (ref 0.61–1.24)
GFR, Est AFR Am: >60 mL/min (ref >60)
GFR, Estimated: 52 mL/min — ABNORMAL LOW (ref >60)
Glucose, Bld: 290 mg/dL — ABNORMAL HIGH (ref 70–99)
Potassium: 3.6 mmol/L (ref 3.5–5.1)
Sodium: 133 mmol/L — ABNORMAL LOW (ref 135–145)
Total Bilirubin: 0.5 mg/dL (ref 0.3–1.2)
Total Protein: 6.3 g/dL — ABNORMAL LOW (ref 6.5–8.1)

## 2019-12-24 LAB — SAMPLE TO BLOOD BANK

## 2019-12-24 MED ORDER — SODIUM CHLORIDE 0.9% FLUSH
10.0000 mL | INTRAVENOUS | Status: DC | PRN
  Administered 2019-12-24: 10 mL
  Filled 2019-12-24: qty 10

## 2019-12-24 MED ORDER — HEPARIN SOD (PORK) LOCK FLUSH 100 UNIT/ML IV SOLN
500.0000 [IU] | Freq: Once | INTRAVENOUS | Status: AC | PRN
  Administered 2019-12-24: 500 [IU]
  Filled 2019-12-24: qty 5

## 2019-12-24 NOTE — Telephone Encounter (Signed)
  Radiation Oncology         [phone] ________________________________  Name: [name] [name]: [id number]  Date of Service: 12/24/2019  DOB: 12-13-1945  Post Treatment Telephone Note  Diagnosis:  Limited Stage Small Cell Carcinoma of the right lower lobe/ right hilum.  Interval Since Last Radiation: 4 weeks   10/18/19 - 11/28/19: The patient was treated to the disease within the right lung initially to a dose of 37.5 Gy using a 3 field, 3-D conformal technique.  As the patient's staging was completed, additional radiation treatment to the lung was planned for an additional 21.6 Gray using a 4 field 3D conformal technique.  The final total dose of the lung therefore was 59.1 Gray.    Narrative:  The patient was contacted today for routine follow-up. During treatment he did very well with radiotherapy and did not have significant desquamation. He reports he is doing well and denies any concerns with eating at this time, in fact he's currently eating a hamburger. He reports he has done well with chemotherapy.  Impression/Plan: 1. 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 The patient has been doing well since completion of radiotherapy.He will continue to follow up with Dr. [name] in medical oncology as he continues with chemotherapy.  2. PCI. The patient is aware after our conversation of the risks of CNS disease just based on the tumor type. We reviewed the rationale for a repeat MRI and PCI vs. whole brain radiotherapy after completion of chemotherapy. I will reach out to him in November to revisit this conversation once he's completed chemotherapy.    Carola Rhine, PAC

## 2019-12-24 NOTE — Progress Notes (Addendum)
[name], [name] ([id number]) Visit Report for 12/24/2019 Chief Complaint Document Details Patient Name: [name], [name] Date of Service: 12/24/2019 11:00 AM Medical Record Number: [id number] Patient Account Number: [id number] Date of Birth/Sex: 23-Sep-1945 (74 y.o. M) Treating RN: Grover Canavan Primary Care Provider: [name] Other Clinician: Referring Provider: [name] Treating Provider/Extender: Melburn Hake, [name] [name] Weeks in Treatment: 3 Information Obtained from: Patient Chief Complaint Left plantar foot ulcer s/p surgical debridement and wound VAC placement. Left great toe ulcer. Electronic Signature(s) Signed: 12/24/2019 11:13:42 AM By: Worthy Keeler [name] Entered By: Worthy Keeler on 12/24/2019 11:13:41 [name] ([id number]) -------------------------------------------------------------------------------- Debridement Details Patient Name: [name] Date of Service: 12/24/2019 11:00 AM Medical Record Number: [id number] Patient Account Number: [id number] Date of Birth/Sex: 02-27-1946 (74 y.o. M) Treating RN: Grover Canavan Primary Care Provider: [name] Other Clinician: Referring Provider: [name] Treating Provider/Extender: Melburn Hake, Winthrop Shannahan Weeks in Treatment: 3 Debridement Performed for Wound #7 Left,Lateral,Dorsal Foot Assessment: Performed By: Physician [name], Mayumi Summerson E., [name] Debridement Type: Debridement Severity of Tissue Pre Debridement: Fat layer exposed Level of Consciousness (Pre- Awake and Alert procedure): Pre-procedure Verification/Time Out Yes - 11:40 Taken: Start Time: 11:41 Pain Control: Lidocaine Total Area Debrided (L x W): 2.2 (cm) x 3 (cm) = 6.6 (cm) Tissue and other material Viable, Non-Viable, Callus, Subcutaneous debrided: Level: Skin/Subcutaneous Tissue Debridement Description: Excisional Instrument: Curette Bleeding: Moderate Hemostasis Achieved: Pressure End Time: 11:45 Procedural  Pain: 0 Post Procedural Pain: 0 Response to Treatment: Procedure was tolerated well Level of Consciousness (Post- Awake and Alert procedure): Post Debridement Measurements of Total Wound Length: (cm) 2.2 Width: (cm) 3 Depth: (cm) 0.4 Volume: (cm) 2.073 Character of Wound/Ulcer Post Debridement: Improved Severity of Tissue Post Debridement: Fat layer exposed Post Procedure Diagnosis Same as Pre-procedure Electronic Signature(s) Signed: 12/24/2019 4:32:44 PM By: Grover Canavan Signed: 12/24/2019 4:45:03 PM By: Worthy Keeler [name] Entered By: Grover Canavan on 12/24/2019 11:43:40 [name] ([id number]) -------------------------------------------------------------------------------- HPI Details Patient Name: [name] Date of Service: 12/24/2019 11:00 AM Medical Record Number: [id number] Patient Account Number: [id number] Date of Birth/Sex: 1945-06-20 (74 y.o. M) Treating RN: Grover Canavan Primary Care Provider: [name] Other Clinician: Referring Provider: [name] Treating Provider/Extender: Melburn Hake, Latoia Eyster Weeks in Treatment: 3 History of Present Illness HPI Description: 10/04/16 on evaluation today patient presents for initial evaluation concerning a laceration of the right wrist and hand on the bowler aspect. He tells me that he fell into glass following a medication change in regard to his diabetes. He initially went to [facility] where she was sutured and subsequently his daughter who is a Marine scientist removed the teachers at the appropriate time. Unfortunately the wound has done poorly and dehissed which has caused him some trouble including infection. He has been on antibiotics both topically and orally though the wound continues to show signs of necrotic tissue according to notes which is what he was sent to [place] for evaluation concerning. He does have diabetes and unfortunately this is severely uncontrolled. He does see an endocrinologist and  is working on this. 10/11/2016 -- the patient's notes from the ER were reviewed and he had his suturing done on 09/17/2016 and had necrotic skin flaps which had to be trimmed the last visit he was here. He has managed to get his Santyl ointment and has been using this appropriately. 10/21/16 patient's wound appears to be doing very well on evaluation today. He still has some Slough covering the wound bed but  this is nowhere near as severe as when i saw this initially two weeks ago nor even my review of his pictures last week. I'm pleased with how this is progressing. Patient's wound over the right form appears to be doing fairly well on evaluation today. There is no evidence of infection and he has a small area at the crease of where she is wrist extends and flexes that is still open and appears to be having difficulty due to the fact that it is cracking open. I think this is something that may be controlled with moisture control that is keeping the wound a little bit more moist. Nonetheless this is a tiny region compared to the initial wound and overall appears to be doing very well. No fevers, chills, nausea, or vomiting noted at this time. 11/25/16 on evaluation today patient appears to be doing well in regard to his right wrist ulcer. In fact this appears to be completely healed he is having no discomfort. He is pleased with how this is progressed. Readmission: 06/24/17 on evaluation today patient appears back in our clinic regarding issues that he has been having with his right ankle on the lateral malleolus as well is in the dorsal surface of his left foot. Both have been going on for several weeks unfortunately. He tells me that initially this was being managed by Dr. Edilia Bo and I did review the notes from Dr. Edilia Bo in epic. Patient was placed on amoxicillin initially and then subsequently Keflex following. He has been using triple antibiotic ointment over-the-counter along with a Band-Aid  for the wound currently. He did not have any Santyl remaining. His most recent hemoglobin A1c which was just two days ago was 10.3. Fortunately he seems to have excellent blood flow in the bilateral lower extremities with his left ABI being 0.95 in the right ABI 1.01. Patient does have some pain in regard to each of these ulcerations but he tells me it's not nearly as severe as the hand wound that I help treat earlier over the summer. This was in 2018. 07/15/17 on evaluation today patient's lower surety ulcer is actually both appear to be doing fairly well at this point. He does have a little bit of skin overlapping the ulcer on the right lateral ankle this was debrided away today just with saline and gauze without complication patient had no discomfort or pain. Otherwise patient's left foot ulcer does appear to be showing signs of improvement he does seem to have a right great toe. Nikki and noted at this point in time. I think that this does need to be managed at this point. No fevers, chills, nausea, or vomiting noted at this time. 07/22/17 on evaluation today patient's wounds in regard to his lower extremities appear to be doing somewhat better. His right ankle wound in fact appears to likely be completely closed. The left dorsal foot ulcer though still open does appear to be a little bit smaller and does also seem to be making good progress. He has not been having problems with the treatment at this point in the general he does not seem to show as much erythema surrounding the wound bed which is good news. He is not having significant discomfort. 08/12/17 on evaluation today patients wounds of both appeared to be doing better in fact they both appear to be completely healed. Overall I'm very pleased with how things have progressed over the past week even more than what I expected. Obviously this is good news  and he is happy about this. He did have a fall when going to his orthopedic doctor recently  fortunately he does not appear to have injured anything severely but nonetheless does have a couple of bumps and bruises nothing significant at this time but he needs to be seen and wound care for. He may have broken his left wrist he is following up with orthopedics in that regard. Readmission: 10/24/17 on evaluation today patient presents for reevaluation after having been discharged Aug 12, 2017. His wound was healed at that point although he states that it has been somewhat discolored since that time. He has not experienced the drainage as far as the wound is concerned but the fact that it was still discolored been greater than two months after the fact made him want to come in for reevaluation. Upon inspection today the patient has no pain although he does have neuropathy. He does have a discolored region of the dorsal surface of the left foot which was the site where the ulcer was that we previously treated. The good news is he does not again have any discomfort although obviously I do believe this may be some scar tissue and just discoloration in that regard. Again he hasn't had any drainage or discharge since I last saw him and at this point I see no evidence of that either this area shows complete epithelialization. In general I do not feel like there's any significant issue at this point in that regard. 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 He has been in this clinic before in 2018 with a laceration on his right hand and then again in 2019 with an area over the right lateral malleolus. 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 He states that the wound is been there for the last week or [name], [name]. ([id number]) 2. It is been uncomfortable to walk on. The man is an active man having to look after an elderly  mother. Past medical history, type 2 diabetes with polyneuropathy with a recent hemoglobin A1c in September 2011 0.3, lumbar disc disease, hypergonadism., Left foot drop and gout. We did not repeat his ABIs in the clinic quoting values from July 2019 of 0.95 and 1.01. He is not felt to have significant PAD 02/19/2019 on evaluation today patient actually appears to be doing better with regard to his foot wound based on what I am seeing currently. He was seen by Dr. Dellia Nims on readmission back into the clinic last week when I was off. The good news is the patient has been tolerating the dressing changes without any complication in fact has not really had much in the way of drainage. He does have an AFO brace which she believes is actually pushing on this area as well and has contributed to the issue. Fortunately there is no signs of active infection at this point. His ABIs appear to be well. 03/05/2019 on evaluation today patient appears to be doing well in regard to his foot ulcer. He has been tolerating the dressing changes without complication. Fortunately there is no signs of active infection. The wound is measuring somewhat smaller and overall I feel like he is making good progress at this point. 03/12/2019 on evaluation today patient actually appears to be doing quite well with regard to his foot ulcer. The wound is looking  better the measurement is not significantly better at this point unfortunately but nonetheless again the overall appearance is improving I think he is making some progress. I still think the big issue here is foot drop and again the patient wants to see if potentially he could get a brace to help with this. Subsequently I think that we can definitely make a referral to [facility] to see if they can help [place] in this regard. He is in agreement with that plan. 03/26/2019 on evaluation today patient appears to be doing more poorly in regard to his foot ulcer at this time. He did see  podiatry in order to see about getting a brace but unfortunately he tells me that Medicare is not likely to pay for one for him as he had 1 I guess 8 months ago. However this one that apparently goes in his shoes that does not seem to be working out well for him. Unfortunately there are signs of active infection at this time. No fevers, chills, nausea, vomiting, or diarrhea. Systemically. The patient has erythema on the dorsal surface of his foot that tracks from the lateral portion of his foot and again he does have a blister around the wound as well which is worse compared to previous. Overall I am concerned. 04/02/2019 on evaluation today patient appears to be doing about the same with regard to his wound. Unfortunately there is no signs of significant improvement although I do think there is no signs of infection which is good news. His drainage is also slowed down tremendously. I think at this point he would benefit from the initiation of a total contact cast. The only concern I have is that over his balance. I explained to the patient that if we were to initiate the cast I think it would be greatly beneficial for him but he would need to ensure not to walk at all unless he uses a walker he actually has 2 walkers one for inside his home and one that he keeps out in the car. For that reason he would be covered in that regard. I just do not want him to have any accidents and fall with the cast. 12/28-Patient comes in 1 week out a day early for his appointment he was in the hospital for 2 days for uncontrolled hyperglycemia and required management of high blood sugars, with some adjustments in his home regimen of insulin, patient stubbed his right great toe today and put a Band-Aid on it, he is here for his left lateral foot ulcer and we are using silver alginate dressing, he was intact total contact cast with difficulty with balance to the extent that that had to be discontinued 04/16/2019 on  evaluation today patient appears to be doing better with regard to the overall appearance of his wound size. This is good news. We had attempted a total contact cast but unfortunately he had some falling issues that the family contacted [place] about we took the cast off we did not put it back on. Nonetheless it ended up that just a couple days later he was in the hospital due to elevations in his blood sugar which went to around 500 and even a little higher. Subsequently he was admitted to the hospital and was in the hospital for a total of 3 days and that included Christmas day. Fortunately there got his blood sugar under control he tells me that this is running about 200 now which is much better news. There does  not appear to be any signs of active infection at this time. No fevers, chills, nausea, vomiting, or diarrhea. 04/30/2019 on evaluation today patient actually appears to be doing slightly worse compared to previous as far as the overall size of his wound is concerned. There also is some need for sharp debridement today as well. Fortunately there is no evidence of active infection which is good news. No fevers, chills, nausea, vomiting, or diarrhea. 05/07/2019 upon evaluation today patient appears to be doing excellent in regard to his plantar foot ulcer. He has been tolerating the dressing changes without complication. Fortunately there is no signs of active infection at this time. No fevers, chills, nausea, vomiting, or diarrhea. He does tell me that he actually got in touch with [employer] and they feel like they can add diagnosis codes to his orders in order to get an external brace for him as opposed to the one that goes internal to issue. That would be excellent for his foot drop and I did tell him that if there is anything I need to fill out in that regard I will be more than happy to oblige and fill out what is needed for [employer]. 05/14/2019 upon evaluation today patient's original wound site  actually appears to be doing much better which is good news. Fortunately there is no signs of active infection at this time. No fevers, chills, nausea, vomiting, or diarrhea. With that being said the patient unfortunately is continuing to have issues with rubbing he tells me that the felt slid on his foot although he did not realize it until today. Nonetheless I think this did cause a blister around the edge of the wound and he does an open wound that is slightly larger overall in measurement compared to last time mainly due to this blister. Other than that I feel like he is doing quite well. 05/21/2019 upon evaluation today patient appears to be doing excellent in regard to his wound at this time on the plantar foot. He is showing signs of improvement he does have some slough and biofilm noted on the surface of the wound this is going require some sharp debridement today as well. Fortunately there is no evidence of active infection. No fevers, chills, nausea, vomiting, or diarrhea. 05/28/2019 on evaluation today patient's wound actually appears to be measuring smaller upon initial inspection and I do believe that it is doing well. With that being said he continues to have a blister on the outside/lateral edge of the wound that occurs as a result of rubbing due to his dropfoot currently. Subsequently we have filled out the paperwork for his new external brace but again that does take several weeks to get that completed once everything is returned to back we did send this back last week as far as the paperwork was concerned. With that being said there does not appear to be any signs of active infection at this time. 06/04/2019 on evaluation today patient's wound actually appears to be doing slightly better compared to last week which is good news. He still states that his insurance unfortunately does not seem to want to cover any of his new brace. He is going to just pay for this himself. Nonetheless he is  going by there today when he leaves here that is [employer] in [place] that is going to be making this brace for him. 06/18/2019 upon evaluation today patient fortunately does have his new brace which is the external AFO brace with bilateral uprights. Subsequently he  seems to be doing much better in fact the wound already appears better. He has not even been using the felt he is just using a dressing over [place] ([id number]) top of the wound and overall I am very pleased with how things seem to be progressing. There is no signs of active infection at this time which is great news and I think that the AFO is good to help him tremendously as far as trying to keep pressure off of the 06/25/2019 upon evaluation today patient seems to be making good progress in regard to his wound. He has been tolerating the dressing changes without complication. Fortunately there is no signs of active infection at this time. No fevers, chills, nausea, vomiting, or diarrhea. 07/02/2019 upon evaluation today patient unfortunately had a blistered area on the bottom of his foot tracking more towards the midline of the plantar aspect. He does not seem to have issues with rubbing more laterally that he used to have but nonetheless this is still giving him some trouble and causing problems here. Fortunately there is no signs of active infection at this time. No fevers, chills, nausea, vomiting, or diarrhea. 07/09/2019 upon evaluation today patient appears to be doing well with regard to his plantar foot ulcer. Definitely this is much improved compared to last week's visit which again last week the issue there was that he had an area of callus buildup on the plantar aspect of his foot I was able to clean this away and after effectively doing so the wound seems to be doing much better which is great news. There is no signs of active infection at this time. 07/16/2019 upon evaluation today patient appears to be making some  progress in my opinion with regard to his plantar foot ulcer. He has been tolerating the dressing changes without complication. Fortunately there is no evidence of active infection at this time which is great news. 07/23/2019 upon evaluation today patient appears to be doing well with regard to his wounds on the plantar foot. He has been tolerating the dressing changes without complication. These do seem to be healing. 08/06/2019 upon evaluation today patient's wound actually appears to be showing some signs of improvement though he still has a significant amount of callus buildup over this location. That is can require some debridement today. With that being said the actual wound openings seem to be dramatically improved which is great news. There is no signs of active infection at this time. No fevers, chills, nausea, vomiting, or diarrhea. 08/13/2019 upon evaluation today patient appears to be doing well with regard to his foot ulcer. He has been tolerating the dressing changes without complication. Fortunately there is no signs of active infection at this time. No fevers, chills, nausea, vomiting, or diarrhea. 08/20/2019 upon evaluation today patient appears to be doing excellent in regard to his foot ulcer. I do believe he still showing signs of improvement this is very slow but again I think still pressure getting to the area even with his brace and custom shoe is still part of the main issue with the delay here. Nonetheless I do not see any signs of worsening or infection all of which is good news. 5/20; left lateral foot wound. He wears an AFO brace for foot drop related to his diabetes. The wound is small and clean looking he is using silver collagen. We have apparently tried him in offloading shoe he could not tolerate this. He does not have good balance. 09/11/2019 upon  evaluation today patient appears to be doing more poorly in regard to his foot ulcer as compared to last time I saw  him. Unfortunately he does tell me as we discussed on Friday that he had pus and blood coming from the area underneath the callus. Unfortunately I think that this is a combination of the callus not being trimmed away enough and potentially the felt pad causing an abnormal rubbing which has happened before which led to this becoming more irritated and likely infected based on what I am seeing today. The patient takes ampicillin all the time. Subsequently he also states that he has been trying for a couple of days to take the clindamycin of which she had some leftover from previous. With that being said it upset his stomach. The antibiotic that I sent in for him on Friday unfortunately I wrote the wrong number on as far as the number of pills that he needed. With that being said I corrected that this morning but he tells me is not can pick it up as that seems to be making him have trouble with his stomach being upset at this point. Fortunately there is no signs of active infection at this time systemically. 09/17/2019 upon evaluation today patient appears to be doing better in regard to his foot ulcer. Has been taking ampicillin that he had leftover from a previous prescription. With that being said he did go see his doctor today concerning his diabetes and notes that his hemoglobin A1c was 13 obviously this is elevated and there can be working on that. Also did review the culture result that we finally got back for final well that showed rare Pseudomonas and a few group B strep noted. The Pseudomonas was sensitive to Cipro but again the wound is doing so much better with the ampicillin I think this is probably not a causative organism I do not see anything that really has me concerned as far as infection is concerned today. Therefore I would hold off on prescribing Cipro for the patient and have him discontinue the ampicillin until completion. 09/24/2019 upon evaluation today patient appears to be doing  09/24/2019 upon evaluation today patient appears to be doing well with regard to his foot ulcer. He actually has not enlarged any and seems to be measuring smaller quite significantly at this point. His toe ulcer is also doing somewhat better which is good news. Fortunately there is no evidence of active infection at this time. No fevers, chills, nausea, vomiting, or diarrhea. 10/08/2019 upon evaluation today patient appears to be doing well with regard to his foot ulcer all things considered. Fortunately there is no signs of active infection at this time. No fevers, chills, nausea, vomiting, or diarrhea. Fortunately he states that overall he has been doing much better with regard to not have any blistering on the side of his foot. Unfortunately he did have a fall last night due to the fact that he got up to walk without his brace on and did not have his walker either it was around 4 in the morning and his mother was calling out. Subsequently he states that the foot drop got him and he ended up following. Nonetheless he states he normally uses his walker I explained he needs to try to make sure not to ever go without the walker. No matter how attempting the situation may be. Readmission: 12/03/2019 upon evaluation today patient presents for follow-up after having had surgery last week on his foot for surgical  debridement and then subsequent wound VAC placement. He had gone into the hospital as he was having a significant infection and not feeling well. He is unfortunately since I last saw him also been under the care of the cancer doctors and they have been taking good care of him. He does seem to be in fairly high spirits all things considered. With that being said he has lung cancer he tells me that his back from the radiation feels like it is constantly burned. 12/10/2019 on evaluation today patient appears to be doing well with regard to his wound. I do believe the wound VAC is doing a great job  at helping to manage his drainage and overall health and to granulate in the wound. He has less undermining noted today in fact almost none. Home health is coming out to change the wound VAC on a regular basis Monday, Wednesday, and Friday. 12/24/2019 on evaluation today patient appears to be doing fairly well in regard to the wound on the plantar aspect of his foot. With that being said he does have a significant feeling in of what has been going on with regard to the ulceration which does not have the depth that it did in the past. With that being said I do believe that based on what I am seeing at this point the patient likely does need to take a break from the wound VAC and in fact it appears the wound VAC was actually placed directly on the skin as far as the bridging was concerned causing some irritation of the top of the patient's foot. This has me concerned as well that if were not careful that could worsen. I do believe a break with the wound VAC however will be fine and then we can see where we go from there. [name], [name] ([id number]) Electronic Signature(s) Signed: 12/24/2019 4:33:56 PM By: Worthy Keeler [name] Entered By: Worthy Keeler on 12/24/2019 16:33:56 [name] ([id number]) -------------------------------------------------------------------------------- Physical Exam Details Patient Name: [name] Date of Service: 12/24/2019 11:00 AM Medical Record Number: [id number] Patient Account Number: [id number] Date of Birth/Sex: 11-04-1945 (74 y.o. M) Treating RN: Grover Canavan Primary Care Provider: [name] Other Clinician: Referring Provider: [name] Treating Provider/Extender: [name], Brannon Levene Weeks in Treatment: 3 Constitutional Well-nourished and well-hydrated in no acute distress. Respiratory normal breathing without difficulty. Psychiatric this patient is able to make decisions and demonstrates good insight into disease process.  Alert and Oriented x 3. pleasant and cooperative. Notes Upon inspection patient's wound bed actually showed signs of good granulation at this time. Fortunately there does not appear to be any evidence of active infection at this time which is great news. Overall I am extremely pleased with where he stands at this point and I am wondering if he continues to stay off of this has been in a wheelchair if we cannot get this to heal without the wound VAC more effectively at this point. He question me about the possibility of a total contact cast with his balance issues I am very reluctant to do that. Electronic Signature(s) Signed: 12/24/2019 4:34:29 PM By: Worthy Keeler [name] Entered By: Worthy Keeler on 12/24/2019 16:34:28 [name] ([id number]) -------------------------------------------------------------------------------- Physician Orders Details Patient Name: [name] Date of Service: 12/24/2019 11:00 AM Medical Record Number: [id number] Patient Account Number: [id number] Date of Birth/Sex: 1945/09/11 (74 y.o. 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 Wound #8 Left,Distal Toe Great o Clean wound with Normal Saline. Anesthetic (add to Medication List) Wound #7 Left,Lateral,Dorsal Foot o Topical Lidocaine 4% cream applied to wound bed prior to debridement (In Clinic Only). Wound #8 Left,Distal Toe Great o Topical Lidocaine 4% cream  applied to wound bed prior to debridement (In Clinic Only). Primary Wound Dressing Wound #7 Left,Lateral,Dorsal Foot o Hydrafera Blue Ready Transfer Wound #8 Left,Distal Toe Great o Other: - betadine Secondary Dressing Wound #7 Left,Lateral,Dorsal Foot o ABD and Kerlix/Conform Dressing Change Frequency Wound #7 Left,Lateral,Dorsal Foot o Change Dressing Monday, Wednesday, Friday Wound #8 Left,Distal Toe Great o Change Dressing Monday, Wednesday, Friday Follow-up Appointments Wound #7 Left,Lateral,Dorsal Foot o Return Appointment in 2 weeks. Wound #8 Left,Distal Toe Great o Return Appointment in 2 weeks. Edema Control Wound #7 Left,Lateral,Dorsal Foot o Elevate legs to the level of the heart and pump ankles as often as possible Wound #8 Left,Distal Toe Great o Elevate legs to the level of the heart and pump ankles as often as possible [name], [name]. ([id number]) Off-Loading Wound #7 Left,Lateral,Dorsal Foot o Other: - No weight on foot, Wheelchair only Wound #8 Left,Distal Toe Great o Other: - No weight on foot, Wheelchair only Additional Orders / Instructions Wound #7 Left,Lateral,Dorsal Foot o Increase protein intake. Wound #8 Left,Distal Toe Great o Increase protein intake. Home Health Wound #7 [place] Visits - [place] Nurse may visit PRN to address patientos wound care needs. o FACE TO FACE ENCOUNTER: MEDICARE and MEDICAID PATIENTS: I certify that this patient is under my care and that I had a face-to-face encounter that meets the physician face-to-face encounter requirements with this patient on this date. The encounter with the patient was in whole or in part for the following MEDICAL CONDITION: (primary reason for [place]) MEDICAL NECESSITY: I certify, that based on my findings, NURSING services are a medically necessary home health service. HOME BOUND STATUS: I certify  that my clinical findings support that this patient is homebound (i.e., Due to illness or injury, pt requires aid of supportive devices such as crutches, cane, wheelchairs, walkers, the use of special transportation or the assistance of another person to leave their place of residence. There is a normal inability to leave the home and doing so requires considerable and taxing effort. Other absences are for medical reasons / religious services and are infrequent or of short duration when for other reasons). o If current dressing causes regression in wound condition, may D/C ordered dressing product/s and apply Normal Saline Moist Dressing daily until next [place] / Other MD appointment. [place] of regression in wound condition at [phone]. o Please direct any NON-WOUND related issues/requests for orders to patient's Primary Care Physician Wound #8 Left,Distal Toe [place] Visits - [name] Nurse may visit PRN to address patientos wound care needs. o FACE TO FACE ENCOUNTER: MEDICARE and MEDICAID PATIENTS: I certify that this patient is under my care and that I had a face-to-face encounter that meets the physician face-to-face encounter requirements with this  patient on this date. The encounter with the patient was in whole or in part for the following MEDICAL CONDITION: (primary reason for [place]) MEDICAL NECESSITY: I certify, that based on my findings, NURSING services are a medically necessary home health service. HOME BOUND STATUS: I certify that my clinical findings support that this patient is homebound (i.e., Due to illness or injury, pt requires aid of supportive devices such as crutches, cane, wheelchairs, walkers, the use of special transportation or the assistance of another person to leave their place of residence. There is a normal inability to leave the home and doing so requires considerable and  taxing effort. Other absences are for medical reasons / religious services and are infrequent or of short duration when for other reasons). o If current dressing causes regression in wound condition, may D/C ordered dressing product/s and apply Normal Saline Moist Dressing daily until next [place] / Other MD appointment. [place] of regression in wound condition at [phone]. o Please direct any NON-WOUND related issues/requests for orders to patient's Primary Care Physician Negative Pressure Wound Therapy Wound #7 Left,Lateral,Dorsal Foot o Wound VAC settings at 125/130 mmHg continuous pressure. Use BLACK/GREEN foam to wound cavity. Use WHITE foam to fill any tunnel/s and/or undermining. Change VAC dressing 3 X WEEK. Change canister as indicated when full. Nurse may titrate settings and frequency of dressing changes as clinically indicated. - Collagen with silver under sponge, window pane wound with duoderm before applying NPWT dressing o Home Health Nurse may d/c VAC for s/s of increased infection, significant wound regression, or uncontrolled drainage. [place] at [phone]. o Apply contact layer over base of wound. o Number of foam/gauze pieces used in the dressing = o Place NPWT on HOLD. - until follow up in wound center Electronic Signature(s) Signed: 12/24/2019 4:32:44 PM By: Grover Canavan Signed: 12/24/2019 4:45:03 PM By: Worthy Keeler [name] Entered By: Grover Canavan on 12/24/2019 11:52:36 [name] ([id number]) -------------------------------------------------------------------------------- Problem List Details Patient Name: [name] Date of Service: 12/24/2019 11:00 AM Medical Record Number: [id number] Patient Account Number: [id number] Date of Birth/Sex: May 13, 1945 (74 y.o. 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 12/03/2019 No Yes muscle E11.42 Type 2 diabetes mellitus with diabetic polyneuropathy 12/03/2019 No Yes M21.372 Foot drop, left foot 12/03/2019 No Yes Inactive Problems Resolved Problems Electronic Signature(s) Signed: 12/24/2019 11:13:34 AM By: Worthy Keeler [name] Entered By: Worthy Keeler on 12/24/2019 11:13:34 [name] ([id number]) -------------------------------------------------------------------------------- Progress Note Details Patient Name: [name] Date of Service: 12/24/2019 11:00 AM Medical Record Number: [id number] Patient Account Number: [id number] Date of Birth/Sex: 1945/06/26 (74 y.o. M) Treating RN: Grover Canavan Primary Care Provider: [name] Other Clinician: Referring Provider: [name] Treating Provider/Extender: Melburn Hake, Christoffer Currier Weeks in Treatment: 3 Subjective Chief Complaint Information obtained from Patient Left plantar foot ulcer s/p surgical debridement and wound VAC placement. Left great toe ulcer. History of Present Illness (HPI) 10/04/16 on evaluation today patient presents for initial evaluation concerning a laceration of the right wrist and hand on the bowler aspect. He tells me that he fell into glass following a medication change in regard to his diabetes. He  initially went to [facility] where she was sutured and subsequently his daughter who is a Marine scientist removed the teachers at the appropriate time. Unfortunately the wound has done poorly and dehissed which has caused him some trouble including infection. He has been on antibiotics both topically and orally though the wound continues to show signs of necrotic  tissue according to notes which is what he was sent to [place] for evaluation concerning. He does have diabetes and unfortunately this is severely uncontrolled. He does see an endocrinologist and is working on this. 10/11/2016 -- the patient's notes from the ER were reviewed and he had his suturing done on 09/17/2016 and had necrotic skin flaps which had to be trimmed the last visit he was here. He has managed to get his Santyl ointment and has been using this appropriately. 10/21/16 patient's wound appears to be doing very well on evaluation today. He still has some [place] covering the wound bed but this is nowhere near as severe as when i saw this initially two weeks ago nor even my review of his pictures last week. I'm pleased with how this is progressing. Patient's wound over the right form appears to be doing fairly well on evaluation today. There is no evidence of infection and he has a small area at the crease of where she is wrist extends and flexes that is still open and appears to be having difficulty due to the fact that it is cracking open. I think this is something that may be controlled with moisture control that is keeping the wound a little bit more moist. Nonetheless this is a tiny region compared to the initial wound and overall appears to be doing very well. No fevers, chills, nausea, or vomiting noted at this time. 11/25/16 on evaluation today patient appears to be doing well in regard to his right wrist ulcer. In fact this appears to be completely healed he is having no discomfort. He is pleased with how this is progressed. Readmission: 06/24/17 on evaluation today patient appears back in our clinic regarding issues that he has been having with his right ankle on the lateral malleolus as well is in the dorsal surface of his left foot. Both have been going on for several weeks unfortunately. He tells me that initially this was being managed by Dr. Edilia Bo and I did review the notes from  Dr. Edilia Bo in epic. Patient was placed on amoxicillin initially and then subsequently Keflex following. He has been using triple antibiotic ointment over-the-counter along with a Band-Aid for the wound currently. He did not have any Santyl remaining. His most recent hemoglobin A1c which was just two days ago was 10.3. Fortunately he seems to have excellent blood flow in the bilateral lower extremities with his left ABI being 0.95 in the right ABI 1.01. Patient does have some pain in regard to each of these ulcerations but he tells me it's not nearly as severe as the hand wound that I help treat earlier over the summer. This was in 2018. 07/15/17 on evaluation today patient's lower surety ulcer is actually both appear to be doing fairly well at this point. He does have a little bit of skin overlapping the ulcer on the right lateral ankle this was debrided away today just with saline and gauze without complication patient had no discomfort or pain. Otherwise patient's left foot ulcer does appear to be showing signs of improvement he does seem to have a right great toe. Nikki and noted at  this point in time. I think that this does need to be managed at this point. No fevers, chills, nausea, or vomiting noted at this time. 07/22/17 on evaluation today patient's wounds in regard to his lower extremities appear to be doing somewhat better. His right ankle wound in fact appears to likely be completely closed. The left dorsal foot ulcer though still open does appear to be a little bit smaller and does also seem to be making good progress. He has not been having problems with the treatment at this point in the general he does not seem to show as much erythema surrounding the wound bed which is good news. He is not having significant discomfort. 08/12/17 on evaluation today patients wounds of both appeared to be doing better in fact they both appear to be completely healed. Overall I'm very pleased with how things  have progressed over the past week even more than what I expected. Obviously this is good news and he is happy about this. He did have a fall when going to his orthopedic doctor recently fortunately he does not appear to have injured anything severely but nonetheless does have a couple of bumps and bruises nothing significant at this time but he needs to be seen and wound care for. He may have broken his left wrist he is following up with orthopedics in that regard. Readmission: 10/24/17 on evaluation today patient presents for reevaluation after having been discharged Aug 12, 2017. His wound was healed at that point although he states that it has been somewhat discolored since that time. He has not experienced the drainage as far as the wound is concerned but the fact that it was still discolored been greater than two months after the fact made him want to come in for reevaluation. Upon inspection today the patient has no pain although he does have neuropathy. He does have a discolored region of the dorsal surface of the left foot which was the site where the ulcer was that we previously treated. The good news is he does not again have any discomfort although obviously I do believe this may be some scar tissue and just discoloration in that regard. Again he hasn't had any drainage or discharge since I last saw him and at this point I see no evidence of that either this area shows complete epithelialization. In general I do not feel like there's any significant issue at this point in that regard. [name], [name] ([id number]) 02/09/2019 This is a 74 year old man with type 2 diabetes poorly controlled with significant peripheral neuropathy. He has been in this clinic before in 2018 with a laceration on his right hand and then again in 2019 with an area over the right lateral malleolus. 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 He states that the wound is been there for the last week or 2. It is been uncomfortable to walk on. The man is an active man having to look after an elderly mother. Past medical history, type 2 diabetes with polyneuropathy with a recent hemoglobin A1c in September 2011 0.3, lumbar disc disease, hypergonadism., Left foot drop and gout. We did not repeat his ABIs in the clinic quoting values from July 2019 of 0.95 and 1.01. He is not felt to have significant PAD 02/19/2019 on evaluation today patient actually appears to be doing better with regard to his foot wound based  on what I am seeing currently. He was seen by Dr. Dellia Nims on readmission back into the clinic last week when I was off. The good news is the patient has been tolerating the dressing changes without any complication in fact has not really had much in the way of drainage. He does have an AFO brace which she believes is actually pushing on this area as well and has contributed to the issue. Fortunately there is no signs of active infection at this point. His ABIs appear to be well. 03/05/2019 on evaluation today patient appears to be doing well in regard to his foot ulcer. He has been tolerating the dressing changes without complication. Fortunately there is no signs of active infection. The wound is measuring somewhat smaller and overall I feel like he is making good progress at this point. 03/12/2019 on evaluation today patient actually appears to be doing quite well with regard to his foot ulcer. The wound is looking better the measurement is not significantly better at this point unfortunately but nonetheless again the overall appearance is improving I think he is making some progress. I still think the big issue here is foot drop and again the patient wants to see if potentially he could get a brace to help with this. Subsequently I think that we can definitely make a referral to [facility] to see if they can help  [place] in this regard. He is in agreement with that plan. 03/26/2019 on evaluation today patient appears to be doing more poorly in regard to his foot ulcer at this time. He did see podiatry in order to see about getting a brace but unfortunately he tells me that Medicare is not likely to pay for one for him as he had 1 I guess 8 months ago. However this one that apparently goes in his shoes that does not seem to be working out well for him. Unfortunately there are signs of active infection at this time. No fevers, chills, nausea, vomiting, or diarrhea. Systemically. The patient has erythema on the dorsal surface of his foot that tracks from the lateral portion of his foot and again he does have a blister around the wound as well which is worse compared to previous. Overall I am concerned. 04/02/2019 on evaluation today patient appears to be doing about the same with regard to his wound. Unfortunately there is no signs of significant improvement although I do think there is no signs of infection which is good news. His drainage is also slowed down tremendously. I think at this point he would benefit from the initiation of a total contact cast. The only concern I have is that over his balance. I explained to the patient that if we were to initiate the cast I think it would be greatly beneficial for him but he would need to ensure not to walk at all unless he uses a walker he actually has 2 walkers one for inside his home and one that he keeps out in the car. For that reason he would be covered in that regard. I just do not want him to have any accidents and fall with the cast. 12/28-Patient comes in 1 week out a day early for his appointment he was in the hospital for 2 days for uncontrolled hyperglycemia and required management of high blood sugars, with some adjustments in his home regimen of insulin, patient stubbed his right great toe today and put a Band-Aid on it, he is here for his left lateral  foot  ulcer and we are using silver alginate dressing, he was intact total contact cast with difficulty with balance to the extent that that had to be discontinued 04/16/2019 on evaluation today patient appears to be doing better with regard to the overall appearance of his wound size. This is good news. We had attempted a total contact cast but unfortunately he had some falling issues that the family contacted [place] about we took the cast off we did not put it back on. Nonetheless it ended up that just a couple days later he was in the hospital due to elevations in his blood sugar which went to around 500 and even a little higher. Subsequently he was admitted to the hospital and was in the hospital for a total of 3 days and that included Christmas day. Fortunately there got his blood sugar under control he tells me that this is running about 200 now which is much better news. There does not appear to be any signs of active infection at this time. No fevers, chills, nausea, vomiting, or diarrhea. 04/30/2019 on evaluation today patient actually appears to be doing slightly worse compared to previous as far as the overall size of his wound is concerned. There also is some need for sharp debridement today as well. Fortunately there is no evidence of active infection which is good news. No fevers, chills, nausea, vomiting, or diarrhea. 05/07/2019 upon evaluation today patient appears to be doing excellent in regard to his plantar foot ulcer. He has been tolerating the dressing changes without complication. Fortunately there is no signs of active infection at this time. No fevers, chills, nausea, vomiting, or diarrhea. He does tell me that he actually got in touch with [employer] and they feel like they can add diagnosis codes to his orders in order to get an external brace for him as opposed to the one that goes internal to issue. That would be excellent for his foot drop and I did tell him that if there is anything I  need to fill out in that regard I will be more than happy to oblige and fill out what is needed for [employer]. 05/14/2019 upon evaluation today patient's original wound site actually appears to be doing much better which is good news. Fortunately there is no signs of active infection at this time. No fevers, chills, nausea, vomiting, or diarrhea. With that being said the patient unfortunately is continuing to have issues with rubbing he tells me that the felt slid on his foot although he did not realize it until today. Nonetheless I think this did cause a blister around the edge of the wound and he does an open wound that is slightly larger overall in measurement compared to last time mainly due to this blister. Other than that I feel like he is doing quite well. 05/21/2019 upon evaluation today patient appears to be doing excellent in regard to his wound at this time on the plantar foot. He is showing signs of improvement he does have some slough and biofilm noted on the surface of the wound this is going require some sharp debridement today as well. Fortunately there is no evidence of active infection. No fevers, chills, nausea, vomiting, or diarrhea. 05/28/2019 on evaluation today patient's wound actually appears to be measuring smaller upon initial inspection and I do believe that it is doing well. With that being said he continues to have a blister on the outside/lateral edge of the wound that occurs as a result of  rubbing due to his dropfoot currently. Subsequently we have filled out the paperwork for his new external brace but again that does take several weeks to get that completed once everything is returned to back we did send this back last week as far as the paperwork was concerned. With that being said there does not appear to be any signs of active infection at this time. 06/04/2019 on evaluation today patient's wound actually appears to be doing slightly better compared to last week which is  good news. He still [name], [name] ([id number]) states that his insurance unfortunately does not seem to want to cover any of his new brace. He is going to just pay for this himself. Nonetheless he is going by there today when he leaves here that is [employer] in [place] that is going to be making this brace for him. 06/18/2019 upon evaluation today patient fortunately does have his new brace which is the external AFO brace with bilateral uprights. Subsequently he seems to be doing much better in fact the wound already appears better. He has not even been using the felt he is just using a dressing over top of the wound and overall I am very pleased with how things seem to be progressing. There is no signs of active infection at this time which is great news and I think that the AFO is good to help him tremendously as far as trying to keep pressure off of the 06/25/2019 upon evaluation today patient seems to be making good progress in regard to his wound. He has been tolerating the dressing changes without complication. Fortunately there is no signs of active infection at this time. No fevers, chills, nausea, vomiting, or diarrhea. 07/02/2019 upon evaluation today patient unfortunately had a blistered area on the bottom of his foot tracking more towards the midline of the plantar aspect. He does not seem to have issues with rubbing more laterally that he used to have but nonetheless this is still giving him some trouble and causing problems here. Fortunately there is no signs of active infection at this time. No fevers, chills, nausea, vomiting, or diarrhea. 07/09/2019 upon evaluation today patient appears to be doing well with regard to his plantar foot ulcer. Definitely this is much improved compared to last week's visit which again last week the issue there was that he had an area of callus buildup on the plantar aspect of his foot I was able to clean this away and after effectively doing so the wound  seems to be doing much better which is great news. There is no signs of active infection at this time. 07/16/2019 upon evaluation today patient appears to be making some progress in my opinion with regard to his plantar foot ulcer. He has been tolerating the dressing changes without complication. Fortunately there is no evidence of active infection at this time which is great news. 07/23/2019 upon evaluation today patient appears to be doing well with regard to his wounds on the plantar foot. He has been tolerating the dressing changes without complication. These do seem to be healing. 08/06/2019 upon evaluation today patient's wound actually appears to be showing some signs of improvement though he still has a significant amount of callus buildup over this location. That is can require some debridement today. With that being said the actual wound openings seem to be dramatically improved which is great news. There is no signs of active infection at this time. No fevers, chills, nausea, vomiting, or diarrhea. 08/13/2019 upon  evaluation today patient appears to be doing well with regard to his foot ulcer. He has been tolerating the dressing changes without complication. Fortunately there is no signs of active infection at this time. No fevers, chills, nausea, vomiting, or diarrhea. 08/20/2019 upon evaluation today patient appears to be doing excellent in regard to his foot ulcer. I do believe he still showing signs of improvement this is very slow but again I think still pressure getting to the area even with his brace and custom shoe is still part of the main issue with the delay here. Nonetheless I do not see any signs of worsening or infection all of which is good news. 5/20; left lateral foot wound. He wears an AFO brace for foot drop related to his diabetes. The wound is small and clean looking he is using silver collagen. We have apparently tried him in offloading shoe he could not tolerate this. He  does not have good balance. 09/11/2019 upon evaluation today patient appears to be doing more poorly in regard to his foot ulcer as compared to last time I saw him. Unfortunately he does tell me as we discussed on Friday that he had pus and blood coming from the area underneath the callus. Unfortunately I think that this is a combination of the callus not being trimmed away enough and potentially the felt pad causing an abnormal rubbing which has happened before which led to this becoming more irritated and likely infected based on what I am seeing today. The patient takes ampicillin all the time. Subsequently he also states that he has been trying for a couple of days to take the clindamycin of which she had some leftover from previous. With that being said it upset his stomach. The antibiotic that I sent in for him on Friday unfortunately I wrote the wrong number on as far as the number of pills that he needed. With that being said I corrected that this morning but he tells me is not can pick it up as that seems to be making him have trouble with his stomach being upset at this point. Fortunately there is no signs of active infection at this time systemically. 09/17/2019 upon evaluation today patient appears to be doing better in regard to his foot ulcer. Has been taking ampicillin that he had leftover from a previous prescription. With that being said he did go see his doctor today concerning his diabetes and notes that his hemoglobin A1c was 13 obviously this is elevated and there can be working on that. Also did review the culture result that we finally got back for final well that showed rare Pseudomonas and a few group B strep noted. The Pseudomonas was sensitive to Cipro but again the wound is doing so much better with the ampicillin I think this is probably not a causative organism I do not see anything that really has me concerned as far as infection is concerned today. Therefore I would hold off  on prescribing Cipro for the patient and have him discontinue the ampicillin until completion. 09/24/2019 upon evaluation today patient appears to be doing 09/24/2019 upon evaluation today patient appears to be doing well with regard to his foot ulcer. He actually has not enlarged any and seems to be measuring smaller quite significantly at this point. His toe ulcer is also doing somewhat better which is good news. Fortunately there is no evidence of active infection at this time. No fevers, chills, nausea, vomiting, or diarrhea. 10/08/2019 upon evaluation today  patient appears to be doing well with regard to his foot ulcer all things considered. Fortunately there is no signs of active infection at this time. No fevers, chills, nausea, vomiting, or diarrhea. Fortunately he states that overall he has been doing much better with regard to not have any blistering on the side of his foot. Unfortunately he did have a fall last night due to the fact that he got up to walk without his brace on and did not have his walker either it was around 4 in the morning and his mother was calling out. Subsequently he states that the foot drop got him and he ended up following. Nonetheless he states he normally uses his walker I explained he needs to try to make sure not to ever go without the walker. No matter how attempting the situation may be. Readmission: 12/03/2019 upon evaluation today patient presents for follow-up after having had surgery last week on his foot for surgical debridement and then subsequent wound VAC placement. He had gone into the hospital as he was having a significant infection and not feeling well. He is unfortunately since I last saw him also been under the care of the cancer doctors and they have been taking good care of him. He does seem to be in fairly high spirits all things considered. With that being said he has lung cancer he tells me that his back from the radiation feels like it is  constantly burned. 12/10/2019 on evaluation today patient appears to be doing well with regard to his wound. I do believe the wound VAC is doing a great job at helping to manage his drainage and overall health and to granulate in the wound. He has less undermining noted today in fact almost none. Home health is coming out to change the wound VAC on a regular basis Monday, Wednesday, and Friday. 12/24/2019 on evaluation today patient appears to be doing fairly well in regard to the wound on the plantar aspect of his foot. With that being said he does have a significant feeling in of what has been going on with regard to the ulceration which does not have the depth that it did in the [place] ([id number]) past. With that being said I do believe that based on what I am seeing at this point the patient likely does need to take a break from the wound VAC and in fact it appears the wound VAC was actually placed directly on the skin as far as the bridging was concerned causing some irritation of the top of the patient's foot. This has me concerned as well that if were not careful that could worsen. I do believe a break with the wound VAC however will be fine and then we can see where we go from there. Objective Constitutional Well-nourished and well-hydrated in no acute distress. Vitals Time Taken: 11:20 AM, Height: 71 in, Weight: 190 lbs, BMI: 26.5, Temperature: 97.4 F, Pulse: 91 bpm, Respiratory Rate: 18 breaths/min, Blood Pressure: 112/71 mmHg. Respiratory normal breathing without difficulty. Psychiatric this patient is able to make decisions and demonstrates good insight into disease process. Alert and Oriented x 3. pleasant and cooperative. General Notes: Upon inspection patient's wound bed actually showed signs of good granulation at this time. Fortunately there does not appear to be any evidence of active infection at this time which is great news. Overall I am extremely pleased with  where he stands at this point and I am wondering if he continues to  stay off of this has been in a wheelchair if we cannot get this to heal without the wound VAC more effectively at this point. He question me about the possibility of a total contact cast with his balance issues I am very reluctant to do that. Integumentary (Hair, Skin) Wound #7 status is Open. Original cause of wound was Surgical Injury. The wound is located on the Left,Lateral,Dorsal Foot. The wound measures 2.2cm length x 3cm width x 0.4cm depth; 5.184cm^2 area and 2.073cm^3 volume. There is Fat Layer (Subcutaneous Tissue) exposed. There is a medium amount of serosanguineous drainage noted. The wound margin is flat and intact. There is small (1-33%) pink granulation within the wound bed. There is a large (67-100%) amount of necrotic tissue within the wound bed including Adherent Slough. Wound #8 status is Open. Original cause of wound was Pressure Injury. The wound is located on the [employer]. The wound measures 0.4cm length x 0.5cm width x 0.1cm depth; 0.157cm^2 area and 0.016cm^3 volume. 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 There was a Excisional Skin/Subcutaneous Tissue Debridement with a total area of 6.6 sq cm performed by [name], Shandel Busic E., [name]. With the following instrument(s): Curette to remove Viable and Non-Viable tissue/material. Material removed includes Callus and Subcutaneous Tissue and after achieving pain control using Lidocaine. No specimens were taken. A time out was conducted at 11:40, prior to the start  of the procedure. A Moderate amount of bleeding was controlled with Pressure. The procedure was tolerated well with a pain level of 0 throughout and a pain level of 0 following the procedure. Post Debridement Measurements: 2.2cm length x 3cm width x 0.4cm depth; 2.073cm^3 volume. Character of Wound/Ulcer Post Debridement is improved. Severity of Tissue Post Debridement is: Fat layer exposed. Post procedure Diagnosis Wound #7: Same as Pre-Procedure [name], [name] ([id number]) Plan Wound Cleansing: Wound #7 Left,Lateral,Dorsal Foot: Clean wound with Normal Saline. Wound #8 Left,Distal Toe Great: Clean wound with Normal Saline. Anesthetic (add to Medication List): Wound #7 Left,Lateral,Dorsal Foot: Topical Lidocaine 4% cream applied to wound bed prior to debridement (In Clinic Only). Wound #8 Left,Distal Toe Great: Topical Lidocaine 4% cream applied to wound bed prior to debridement (In Clinic Only). Primary Wound Dressing: Wound #7 Left,Lateral,Dorsal Foot: Hydrafera Blue Ready Transfer Wound #8 Left,Distal Toe Great: Other: - betadine Secondary Dressing: Wound #7 Left,Lateral,Dorsal Foot: ABD and Kerlix/Conform Dressing Change Frequency: Wound #7 Left,Lateral,Dorsal Foot: Change Dressing Monday, Wednesday, Friday Wound #8 Left,Distal Toe Great: Change Dressing Monday, Wednesday, Friday Follow-up Appointments: Wound #7 Left,Lateral,Dorsal Foot: Return Appointment in 2 weeks. Wound #8 Left,Distal Toe Great: Return Appointment in 2 weeks. Edema Control: Wound #7 Left,Lateral,Dorsal Foot: Elevate legs to the level of the heart and pump ankles as often as possible Wound #8 Left,Distal Toe Great: Elevate legs to the level of the heart and pump ankles as often as possible Off-Loading: Wound #7 Left,Lateral,Dorsal Foot: Other: - No weight on foot, Wheelchair only Wound #8 Left,Distal Toe Great: Other: - No weight on foot, Wheelchair only Additional Orders / Instructions: Wound  #7 Left,Lateral,Dorsal Foot: Increase protein intake. Wound #8 Left,Distal Toe Great: Increase protein intake. Home Health: Wound #7  Left,Lateral,Dorsal Foot: [place] Visits - [place] Nurse may visit PRN to address patient s wound care needs. FACE TO FACE ENCOUNTER: MEDICARE and MEDICAID PATIENTS: I certify that this patient is under my care and that I had a face-to-face encounter that meets the physician face-to-face encounter requirements with this patient on this date. The encounter with the patient was in whole or in part for the following MEDICAL CONDITION: (primary reason for [place]) MEDICAL NECESSITY: I certify, that based on my findings, NURSING services are a medically necessary home health service. HOME BOUND STATUS: I certify that my clinical findings support that this patient is homebound (i.e., Due to illness or injury, pt requires aid of supportive devices such as crutches, cane, wheelchairs, walkers, the use of special transportation or the assistance of another person to leave their place of residence. There is a normal inability to leave the home and doing so requires considerable and taxing effort. Other absences are for medical reasons / religious services and are infrequent or of short duration when for other reasons). If current dressing causes regression in wound condition, may D/C ordered dressing product/s and apply Normal Saline Moist Dressing daily until next [place] / Other MD appointment. [place] of regression in wound condition at [phone]. Please direct any NON-WOUND related issues/requests for orders to patient's Primary Care Physician Wound #8 Left,Distal Toe Great: [place] Visits - [place] Nurse may visit PRN to address patient s wound care needs. FACE TO FACE ENCOUNTER: MEDICARE and MEDICAID PATIENTS: I certify that this patient is under my care and that I had a  face-to-face encounter that meets the physician face-to-face encounter requirements with this patient on this date. The encounter with the patient was in whole or in part for the following MEDICAL CONDITION: (primary reason for [place]) MEDICAL NECESSITY: I certify, that based on my findings, NURSING services are a medically necessary home health service. HOME BOUND STATUS: I certify that my clinical findings support that this patient is homebound (i.e., Due to illness or injury, pt requires aid of supportive devices such as crutches, cane, wheelchairs, walkers, the use of special transportation or the assistance of another person to leave their place of residence. There is a normal inability to leave the home and doing so requires considerable and taxing effort. Other absences are for medical reasons / religious services and are infrequent or of short duration when for other reasons). If current dressing causes regression in wound condition, may D/C ordered dressing product/s and apply Normal Saline Moist Dressing daily until next [place] / Other MD appointment. [place] of regression in wound condition at [phone]. Please direct any NON-WOUND related issues/requests for orders to patient's Primary Care Physician Negative Pressure Wound Therapy: [name], [name] ([id number]) Wound #7 Left,Lateral,Dorsal Foot: Wound VAC settings at 125/130 mmHg continuous pressure. Use BLACK/GREEN foam to wound cavity. Use WHITE foam to fill any tunnel/s and/or undermining. Change VAC dressing 3 X WEEK. Change canister as indicated when full. Nurse may titrate settings and frequency of dressing changes as clinically indicated. - Collagen with silver under sponge, window pane wound with duoderm before applying NPWT dressing Home Health Nurse may d/c VAC for s/s of increased infection, significant wound regression, or uncontrolled drainage. [place] at  [phone]. Apply contact layer over base of wound. Number of foam/gauze pieces used in the dressing = Place NPWT on HOLD. - until follow up  in wound center 1. I would recommend at this time that we put a hold on the wound VAC at this point. We will switch to a Hydrofera Blue dressing which I think will be better. 2. I am also can recommend at this time that the patient continue to be monitored for any signs of worsening infection. I do not see any evidence of infection right now which is great news. 3. I would recommend he continue to keep pressure off of his foot by using a wheelchair to get around he tells me he has been doing this pretty much all the time at this point. I am reluctant to put him in a total contact cast due to his balance issues that he had in the past. He would definitely need to talk to his family about this and see if they would be able to stay with him and ensure nothing worsened if he were to potentially do this. We will see patient back for reevaluation in 1 week here in the clinic. If anything worsens or changes patient will contact our office for additional recommendations. Electronic Signature(s) Signed: 12/24/2019 4:35:59 PM By: Worthy Keeler [name] Entered By: Worthy Keeler on 12/24/2019 16:35:58 [name] ([id number]) -------------------------------------------------------------------------------- SuperBill Details Patient Name: [name] Date of Service: 12/24/2019 Medical Record Number: [id number] Patient Account Number: [id number] Date of Birth/Sex: January 18, 1946 (74 y.o. M) Treating RN: Grover Canavan Primary Care Provider: [name] Other Clinician: Referring Provider: [name] Treating Provider/Extender: Melburn Hake, Ishmail Mcmanamon Weeks in Treatment: 3 Diagnosis Coding ICD-10 Codes Code Description E11.621 Type 2 diabetes mellitus with foot ulcer L97.523 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  muscle E11.42 Type 2 diabetes mellitus with diabetic polyneuropathy M21.372 Foot drop, left foot Facility Procedures CPT4 Code: 54982641 Description: 58309 - DEB SUBQ TISSUE 20 SQ CM/< Modifier: Quantity: 1 CPT4 Code: Description: ICD-10 Diagnosis Description L97.523 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 m Modifier: uscle Quantity: Physician Procedures CPT4 Code: 4076808 Description: 11042 - WC PHYS SUBQ TISS 20 SQ CM Modifier: Quantity: 1 CPT4 Code: Description: ICD-10 Diagnosis Description L97.523 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 m Modifier: uscle Quantity: Electronic Signature(s) Signed: 12/24/2019 4:36:30 PM By: Worthy Keeler [name] Entered By: Worthy Keeler on 12/24/2019 16:36:29

## 2019-12-24 NOTE — Patient Instructions (Addendum)
Visit Information  Thank you for your time discussing your medications. I look forward to working with you to achieve your health care goals. Below is a summary of what we talked about during our visit.   Goals Addressed              This Visit's Progress   .  Pharmacy Care Plan (pt-stated)        CARE PLAN ENTRY (see longitudinal plan of care for additional care plan information)  Current Barriers:  . Chronic Disease Management support, education, and care coordination needs related to Hypertension, Hyperlipidemia, and Diabetes   Hypertension BP Readings from Last 3 Encounters:  12/15/19 (!) 152/88  12/13/19 (!) 153/89  12/12/19 (!) 161/89   . Pharmacist Clinical Goal(s): o Over the next 90 days, patient will work with PharmD and providers to maintain BP goal <130/80 . Current regimen:  o Lisinopril 10 mg daily o Atenolol 50 mg daily . Interventions: o Reviewed patient's medication list.  o Reviewed patient's home blood pressure readings. Home readings are at or below goal. Patient notes anxiety before treatment at cancer center.  . Patient self care activities - Over the next 90 days, patient will: o Check BP weekly, document, and provide at future appointments o Ensure daily salt intake < 2300 mg/day  Hyperlipidemia Lab Results  Component Value Date/Time   LDLCALC 43 04/24/2009 12:00 AM   LDLDIRECT 82.0 01/03/2019 11:22 AM   . Pharmacist Clinical Goal(s): o Over the next 90 days, patient will work with PharmD and providers to achieve LDL goal < 70 . Current regimen:  o Simvastatin 40 mg daily.  . Interventions: o Reviewed patient's medications.  o Dicussed diet and exercise.  o Consider discussing benefit of high intensity statin at next pharmacist visit. Focused on blood sugar management during initial visit.  . Patient self care activities - Over the next 90 days, patient will: o Continue to eat a variety of fruits, vegetables, whole grains.  o Continue to  take medication as prescribed.   Diabetes Lab Results  Component Value Date/Time   HGBA1C 10.2 (H) 11/23/2019 02:33 AM   HGBA1C 10.6 (H) 10/10/2019 12:21 PM   . Pharmacist Clinical Goal(s): o Over the next 90 days, patient will work with PharmD and providers to achieve A1c goal <7% . Current regimen:  o Humulin U500 30 units tid . Interventions: o Reviewed home blood sugar readings.  o Discussed diet and the benefit of limiting fruit in diet.  o Coordinating with endocrinology to determine if resuming Ozempic is most appropriate next step. Pharmacist will coordinate patient assistance forms if needed.  o Encouraged patient the benefits of tighter blood sugar control.  . Patient self care activities - Over the next 90 days, patient will: o Check blood sugar 3-4 times daily, document, and provide at future appointments o Contact provider with any episodes of hypoglycemia  Medication management . Pharmacist Clinical Goal(s): o Over the next 90 days, patient will work with PharmD and providers to achieve optimal medication adherence . Current pharmacy: [facility] . Interventions o Comprehensive medication review performed. o Continue current medication management strategy . Patient self care activities - Over the next 90 days, patient will: o Focus on medication adherence by continuing to use pill box o Take medications as prescribed o Report any questions or concerns to PharmD and/or provider(s)  Initial goal documentation        [name] was given information about Chronic Care Management services  today including:  1. CCM service includes personalized support from designated clinical staff supervised by his physician, including individualized plan of care and coordination with other care providers 2. 24/7 contact phone numbers for assistance for urgent and routine care needs. 3. Standard insurance, coinsurance, copays and deductibles apply for chronic care management only  during months in which we provide at least 20 minutes of these services. Most insurances cover these services at 100%, however patients may be responsible for any copay, coinsurance and/or deductible if applicable. This service may help you avoid the need for more expensive face-to-face services. 4. Only one practitioner may furnish and bill the service in a calendar month. 5. The patient may stop CCM services at any time (effective at the end of the month) by phone call to the office staff.  Patient agreed to services and verbal consent obtained.   The patient verbalized understanding of instructions provided today and agreed to receive a mailed copy of patient instruction and/or educational materials. Telephone follow up appointment with pharmacy team member scheduled for: 02/25/2020 via phone @ 9 am   Sherre Poot, PharmD Clinical Pharmacist [place] [phone] (office) [phone] (mobile)  Diabetes Mellitus and Nutrition, Adult When you have diabetes (diabetes mellitus), it is very important to have healthy eating habits because your blood sugar (glucose) levels are greatly affected by what you eat and drink. Eating healthy foods in the appropriate amounts, at about the same times every day, can help you:  Control your blood glucose.  Lower your risk of heart disease.  Improve your blood pressure.  Reach or maintain a healthy weight. Every person with diabetes is different, and each person has different needs for a meal plan. Your health care provider may recommend that you work with a diet and nutrition specialist (dietitian) to make a meal plan that is best for you. Your meal plan may vary depending on factors such as:  The calories you need.  The medicines you take.  Your weight.  Your blood glucose, blood pressure, and cholesterol levels.  Your activity level.  Other health conditions you have, such as heart or kidney disease. How do carbohydrates  affect me? Carbohydrates, also called carbs, affect your blood glucose level more than any other type of food. Eating carbs naturally raises the amount of glucose in your blood. Carb counting is a method for keeping track of how many carbs you eat. Counting carbs is important to keep your blood glucose at a healthy level, especially if you use insulin or take certain oral diabetes medicines. It is important to know how many carbs you can safely have in each meal. This is different for every person. Your dietitian can help you calculate how many carbs you should have at each meal and for each snack. Foods that contain carbs include:  Bread, cereal, rice, pasta, and crackers.  Potatoes and corn.  Peas, beans, and lentils.  Milk and yogurt.  Fruit and juice.  Desserts, such as cakes, cookies, ice cream, and candy. How does alcohol affect me? Alcohol can cause a sudden decrease in blood glucose (hypoglycemia), especially if you use insulin or take certain oral diabetes medicines. Hypoglycemia can be a life-threatening condition. Symptoms of hypoglycemia (sleepiness, dizziness, and confusion) are similar to symptoms of having too much alcohol. If your health care provider says that alcohol is safe for you, follow these guidelines:  Limit alcohol intake to no more than 1 drink per day for nonpregnant women and 2 drinks  per day for men. One drink equals 12 oz of beer, 5 oz of wine, or 1 oz of hard liquor.  Do not drink on an empty stomach.  Keep yourself hydrated with water, diet soda, or unsweetened iced tea.  Keep in mind that regular soda, juice, and other mixers may contain a lot of sugar and must be counted as carbs. What are tips for following this plan?  Reading food labels  Start by checking the serving size on the "Nutrition Facts" label of packaged foods and drinks. The amount of calories, carbs, fats, and other nutrients listed on the label is based on one serving of the item.  Many items contain more than one serving per package.  Check the total grams (g) of carbs in one serving. You can calculate the number of servings of carbs in one serving by dividing the total carbs by 15. For example, if a food has 30 g of total carbs, it would be equal to 2 servings of carbs.  Check the number of grams (g) of saturated and trans fats in one serving. Choose foods that have low or no amount of these fats.  Check the number of milligrams (mg) of salt (sodium) in one serving. Most people should limit total sodium intake to less than 2,300 mg per day.  Always check the nutrition information of foods labeled as "low-fat" or "nonfat". These foods may be higher in added sugar or refined carbs and should be avoided.  Talk to your dietitian to identify your daily goals for nutrients listed on the label. Shopping  Avoid buying canned, premade, or processed foods. These foods tend to be high in fat, sodium, and added sugar.  Shop around the outside edge of the grocery store. This includes fresh fruits and vegetables, bulk grains, fresh meats, and fresh dairy. Cooking  Use low-heat cooking methods, such as baking, instead of high-heat cooking methods like deep frying.  Cook using healthy oils, such as olive, canola, or sunflower oil.  Avoid cooking with butter, cream, or high-fat meats. Meal planning  Eat meals and snacks regularly, preferably at the same times every day. Avoid going long periods of time without eating.  Eat foods high in fiber, such as fresh fruits, vegetables, beans, and whole grains. Talk to your dietitian about how many servings of carbs you can eat at each meal.  Eat 4-6 ounces (oz) of lean protein each day, such as lean meat, chicken, fish, eggs, or tofu. One oz of lean protein is equal to: ? 1 oz of meat, chicken, or fish. ? 1 egg. ?  cup of tofu.  Eat some foods each day that contain healthy fats, such as avocado, nuts, seeds, and  fish. Lifestyle  Check your blood glucose regularly.  Exercise regularly as told by your health care provider. This may include: ? 150 minutes of moderate-intensity or vigorous-intensity exercise each week. This could be brisk walking, biking, or water aerobics. ? Stretching and doing strength exercises, such as yoga or weightlifting, at least 2 times a week.  Take medicines as told by your health care provider.  Do not use any products that contain nicotine or tobacco, such as cigarettes and e-cigarettes. If you need help quitting, ask your health care provider.  Work with a Social worker or diabetes educator to identify strategies to manage stress and any emotional and social challenges. Questions to ask a health care provider  Do I need to meet with a diabetes educator?  Do I need  to meet with a dietitian?  What number can I call if I have questions?  When are the best times to check my blood glucose? Where to find more information:  American Diabetes Association: diabetes.org  Academy of Nutrition and Dietetics: www.eatright.[employer] of Diabetes and Digestive and Kidney Diseases (NIH): [employer] Summary  A healthy meal plan will help you control your blood glucose and maintain a healthy lifestyle.  Working with a diet and nutrition specialist (dietitian) can help you make a meal plan that is best for you.  Keep in mind that carbohydrates (carbs) and alcohol have immediate effects on your blood glucose levels. It is important to count carbs and to use alcohol carefully. This information is not intended to replace advice given to you by your health care provider. Make sure you discuss any questions you have with your health care provider. Document Revised: 03/11/2017 Document Reviewed: 05/03/2016 Elsevier Patient Education  2020 [employer].

## 2019-12-24 NOTE — Progress Notes (Addendum)
[name], [name] ([id number]) Visit Report for 12/24/2019 Arrival Information Details Patient Name: [name], [name] Date of Service: 12/24/2019 11:00 AM Medical Record Number: [id number] Patient Account Number: [id number] Date of Birth/Sex: 05/23/1945 (74 y.o. M) Treating RN: Grover Canavan Primary Care Marsella Suman: [name] Other Clinician: Referring Khris Jansson: [name] Treating Raima Geathers/Extender: Melburn Hake, HOYT Weeks in Treatment: 3 Visit Information History Since Last Visit Added or deleted any medications: No Patient Arrived: Wheel Chair Any new allergies or adverse reactions: No Arrival Time: 11:20 Had a fall or experienced change in No Accompanied By: self activities of daily living that may affect Transfer Assistance: None risk of falls: Patient Identification Verified: Yes Signs or symptoms of abuse/neglect since last visito No Secondary Verification Process Completed: Yes Hospitalized since last visit: No Patient Requires Transmission-Based Precautions: No Implantable device outside of the clinic excluding No cellular tissue based products placed in the center since last visit: Has Dressing in Place as Prescribed: Yes Pain Present Now: No Electronic Signature(s) Signed: 12/24/2019 2:11:58 PM By: Darci Needle Entered By: Darci Needle on 12/24/2019 11:29:07 [name] ([id number]) -------------------------------------------------------------------------------- Encounter Discharge Information Details Patient Name: [name] Date of Service: 12/24/2019 11:00 AM Medical Record Number: [id number] Patient Account Number: [id number] Date of Birth/Sex: [birth year] (74 y.o. M) Treating RN: Grover Canavan Primary Care Envi Eagleson: [name] Other Clinician: Referring Makinzi Prieur: [name] Treating Fara Worthy/Extender: Melburn Hake, HOYT Weeks in Treatment: 3 Encounter Discharge Information Items Post Procedure Vitals Discharge  Condition: Stable Temperature (F): 97.4 Ambulatory Status: Wheelchair Pulse (bpm): 91 Discharge Destination: Home Respiratory Rate (breaths/min): 18 Transportation: Other Blood Pressure (mmHg): 112/71 Accompanied By: self Schedule Follow-up Appointment: Yes Clinical Summary of Care: Electronic Signature(s) Signed: 12/24/2019 4:32:44 PM By: Grover Canavan Entered By: Grover Canavan on 12/24/2019 11:54:49 [name] ([id number]) -------------------------------------------------------------------------------- Lower Extremity Assessment Details Patient Name: [name] Date of Service: 12/24/2019 11:00 AM Medical Record Number: [id number] Patient Account Number: [id number] Date of Birth/Sex: [birth year] (74 y.o. M) Treating RN: Grover Canavan Primary Care Michaele Amundson: [name] Other Clinician: Referring Marne Meline: [name] Treating Marven Veley/Extender: Melburn Hake, HOYT Weeks in Treatment: 3 Edema Assessment Assessed: [Left: Yes] [Right: No] Edema: [Left: N] [Right: o] Calf Left: Right: Point of Measurement: 33 cm From Medial Instep 29.5 cm cm Ankle Left: Right: Point of Measurement: 12 cm From Medial Instep 21 cm cm Vascular Assessment Pulses: Dorsalis Pedis Palpable: [Left:Yes] Posterior Tibial Palpable: [Left:Yes] Electronic Signature(s) Signed: 12/24/2019 2:11:58 PM By: Darci Needle Signed: 12/24/2019 4:32:44 PM By: Grover Canavan Entered By: Darci Needle on 12/24/2019 11:32:34 [name] ([id number]) -------------------------------------------------------------------------------- Multi Wound Chart Details Patient Name: [name] Date of Service: 12/24/2019 11:00 AM Medical Record Number: [id number] Patient Account Number: [id number] Date of Birth/Sex: Nov 20, 1945 (74 y.o. M) Treating RN: Grover Canavan Primary Care Latecia Miler: [name] Other Clinician: Referring Chonita Gadea: [name] Treating  Yoskar Murrillo/Extender: Melburn Hake, HOYT Weeks in Treatment: 3 Vital Signs Height(in): 71 Pulse(bpm): 91 Weight(lbs): 190 Blood Pressure(mmHg): 112/71 Body Mass Index(BMI): 26 Temperature(F): 97.4 Respiratory Rate(breaths/min): 18 Photos: [N/A:N/A] Wound Location: Left, Lateral, Dorsal Foot Left, Distal Toe Great N/A Wounding Event: Surgical Injury Pressure Injury N/A Primary Etiology: Diabetic Wound/Ulcer of the Lower Diabetic Wound/Ulcer of the Lower N/A Extremity Extremity Secondary Etiology: N/A Pressure Ulcer N/A Comorbid History: Cataracts, Middle ear problems, Cataracts, Middle ear problems, N/A Sleep Apnea, Hypertension, Type II Sleep Apnea, Hypertension, Type II Diabetes, Gout, Osteoarthritis, Diabetes, Gout, Osteoarthritis, Neuropathy, Received Neuropathy, Received Chemotherapy Chemotherapy Date Acquired: 11/23/2019 09/03/2019 N/A Weeks of Treatment: 3  3 N/A Wound Status: Open Open N/A Clustered Wound: No Yes N/A Measurements L x W x D (cm) 2.2x3x0.4 0.4x0.5x0.1 N/A Area (cm) : 5.184 0.157 N/A Volume (cm) : 2.073 0.016 N/A % Reduction in Area: 18.50% 52.40% N/A % Reduction in Volume: 34.80% 51.50% N/A Classification: Grade 3 Grade 1 N/A Exudate Amount: Medium N/A N/A Exudate Type: Serosanguineous N/A N/A Exudate Color: red, brown N/A N/A Wound Margin: Flat and Intact N/A N/A Granulation Amount: Small (1-33%) N/A N/A Granulation Quality: Pink N/A N/A Necrotic Amount: Large (67-100%) N/A N/A Exposed Structures: Fat Layer (Subcutaneous Tissue): N/A N/A Yes Fascia: No Tendon: No Muscle: No Joint: No Bone: No Epithelialization: Small (1-33%) N/A N/A Treatment Notes Electronic Signature(s) Signed: 12/24/2019 4:32:44 PM By: Milus Height, Grier Rocher ([id number]) Entered By: Grover Canavan on 12/24/2019 11:38:12 [name] ([id number]) -------------------------------------------------------------------------------- Multi-Disciplinary Care Plan  Details Patient Name: [name] Date of Service: 12/24/2019 11:00 AM Medical Record Number: [id number] Patient Account Number: [id number] Date of Birth/Sex: December 23, 1945 (74 y.o. M) Treating RN: Grover Canavan Primary Care Maribeth Jiles: [name] Other Clinician: Referring Mahki Spikes: [name] Treating Acel Natzke/Extender: Melburn Hake, HOYT Weeks in Treatment: 3 Active Inactive Necrotic Tissue Nursing Diagnoses: Impaired tissue integrity related to necrotic/devitalized tissue Goals: Necrotic/devitalized tissue will be minimized in the wound bed Date Initiated: 12/03/2019 Target Resolution Date: 12/07/2019 Goal Status: Active Interventions: Assess patient pain level pre-, during and post procedure and prior to discharge Treatment Activities: Apply topical anesthetic as ordered : 12/03/2019 Notes: Orientation to the Wound Care Program Nursing Diagnoses: Knowledge deficit related to the wound healing center program Goals: Patient/caregiver will verbalize understanding of the [place] Date Initiated: 12/03/2019 Target Resolution Date: 12/07/2019 Goal Status: Active Interventions: Provide education on orientation to the wound center Notes: Pain, Acute or Chronic Nursing Diagnoses: Pain Management - Cyclic Acute (Dressing Change Related) Goals: Patient will verbalize adequate pain control and receive pain control interventions during procedures as needed Date Initiated: 12/03/2019 Target Resolution Date: 12/07/2019 Goal Status: Active Interventions: Assess comfort goal upon admission Treatment Activities: Administer pain control measures as ordered : 12/03/2019 Notes: Wound/Skin Impairment Nursing Diagnoses: Impaired tissue integrity [name], [name] ([id number]) Goals: Ulcer/skin breakdown will have a volume reduction of 30% by week 4 Date Initiated: 12/03/2019 Target Resolution Date: 01/03/2020 Goal Status: Active Ulcer/skin breakdown will have  a volume reduction of 50% by week 8 Date Initiated: 12/03/2019 Target Resolution Date: 02/02/2020 Goal Status: Active Ulcer/skin breakdown will have a volume reduction of 80% by week 12 Date Initiated: 12/03/2019 Target Resolution Date: 03/04/2020 Goal Status: Active Interventions: Assess ulceration(s) every visit Treatment Activities: Referred to DME Michaelanthony Kempton for dressing supplies : 12/03/2019 Topical wound management initiated : 12/03/2019 Notes: Electronic Signature(s) Signed: 12/24/2019 4:32:44 PM By: Grover Canavan Entered By: Grover Canavan on 12/24/2019 11:38:05 [name] ([id number]) -------------------------------------------------------------------------------- Pain Assessment Details Patient Name: [name] Date of Service: 12/24/2019 11:00 AM Medical Record Number: [id number] Patient Account Number: [id number] Date of Birth/Sex: 05/26/1945 (74 y.o. M) Treating RN: Grover Canavan Primary Care Jahdai Padovano: [name] Other Clinician: Referring Abraham Margulies: [name] Treating Javona Bergevin/Extender: Melburn Hake, HOYT Weeks in Treatment: 3 Active Problems Location of Pain Severity and Description of Pain Patient Has Paino No Site Locations With Dressing Change: No Pain Management and Medication Current Pain Management: Electronic Signature(s) Signed: 12/24/2019 2:11:58 PM By: Darci Needle Signed: 12/24/2019 4:32:44 PM By: Grover Canavan Entered By: Darci Needle on 12/24/2019 11:29:23 [name] ([id number]) -------------------------------------------------------------------------------- Patient/Caregiver Education Details Patient Name: Dustin Goodness  E. Date of Service: 12/24/2019 11:00 AM Medical Record Number: [id number] Patient Account Number: [id number] Date of Birth/Gender: 22-Jan-1946 (74 y.o. M) Treating RN: Grover Canavan Primary Care Physician: [name] Other Clinician: Referring Physician: [name] Treating  Physician/Extender: Sharalyn Ink in Treatment: 3 Education Assessment Education Provided To: Patient Education Topics Provided Wound/Skin Impairment: Handouts: Caring for Your Ulcer Methods: Explain/Verbal Responses: State content correctly Electronic Signature(s) Signed: 12/24/2019 4:32:44 PM By: Grover Canavan Entered By: Grover Canavan on 12/24/2019 11:38:29 [name] ([id number]) -------------------------------------------------------------------------------- Wound Assessment Details Patient Name: [name] Date of Service: 12/24/2019 11:00 AM Medical Record Number: [id number] Patient Account Number: [id number] Date of Birth/Sex: Jul 27, 1945 (74 y.o. M) Treating RN: Grover Canavan Primary Care Rafaela Dinius: [name] Other Clinician: Referring Magdelene Ruark: [name] Treating Angellina Ferdinand/Extender: Melburn Hake, HOYT Weeks in Treatment: 3 Wound Status Wound Number: 7 Primary Diabetic Wound/Ulcer of the Lower Extremity Etiology: Wound Location: Left, Lateral, Dorsal Foot Wound Open Wounding Event: Surgical Injury Status: Date Acquired: 11/23/2019 Comorbid Cataracts, Middle ear problems, Sleep Apnea, Weeks Of Treatment: 3 History: Hypertension, Type II Diabetes, Gout, Osteoarthritis, Clustered Wound: No Neuropathy, Received Chemotherapy Photos Wound Measurements Length: (cm) 2.2 Width: (cm) 3 Depth: (cm) 0.4 Area: (cm) 5.184 Volume: (cm) 2.073 % Reduction in Area: 18.5% % Reduction in Volume: 34.8% Epithelialization: Small (1-33%) Wound Description Classification: Grade 3 Wound Margin: Flat and Intact Exudate Amount: Medium Exudate Type: Serosanguineous Exudate Color: red, brown Foul Odor After Cleansing: No Slough/Fibrino Yes Wound Bed Granulation Amount: Small (1-33%) Exposed Structure Granulation Quality: Pink Fascia Exposed: No Necrotic Amount: Large (67-100%) Fat Layer (Subcutaneous Tissue) Exposed: Yes Necrotic Quality:  Adherent Slough Tendon Exposed: No Muscle Exposed: No Joint Exposed: No Bone Exposed: No Treatment Notes Wound #7 (Left, Lateral, Dorsal Foot) Notes betadine to left great toe and leave open to air. Hblue to left lateral plantar foot, abd, conform, stretchnet Electronic Signature(s) [name], [name] ([id number]) Signed: 12/24/2019 2:11:58 PM By: Darci Needle Signed: 12/24/2019 4:32:44 PM By: Grover Canavan Entered By: Darci Needle on 12/24/2019 11:31:03 [name] ([id number]) -------------------------------------------------------------------------------- Wound Assessment Details Patient Name: [name] Date of Service: 12/24/2019 11:00 AM Medical Record Number: [id number] Patient Account Number: [id number] Date of Birth/Sex: 1945/04/24 (74 y.o. M) Treating RN: Grover Canavan Primary Care Anneliese Leblond: [name] Other Clinician: Referring Winslow Verrill: [name] Treating Juelz Whittenberg/Extender: Melburn Hake, HOYT Weeks in Treatment: 3 Wound Status Wound Number: 8 Primary Diabetic Wound/Ulcer of the Lower Extremity Etiology: Wound Location: Left, Distal Toe Great Secondary Pressure Ulcer Wounding Event: Pressure Injury Etiology: Date Acquired: 09/03/2019 Wound Open Weeks Of Treatment: 3 Status: Clustered Wound: Yes Comorbid Cataracts, Middle ear problems, Sleep Apnea, History: Hypertension, Type II Diabetes, Gout, Osteoarthritis, Neuropathy, Received Chemotherapy Photos Wound Measurements Length: (cm) 0.4 Width: (cm) 0.5 Depth: (cm) 0.1 Area: (cm) 0.157 Volume: (cm) 0.016 % Reduction in Area: 52.4% % Reduction in Volume: 51.5% Wound Description Classification: Grade 1 Treatment Notes Wound #8 (Left, Distal Toe Great) Notes betadine to left great toe and leave open to air. Hblue to left lateral plantar foot, abd, conform, stretchnet Electronic Signature(s) Signed: 12/24/2019 2:11:58 PM By: Darci Needle Signed: 12/24/2019 4:32:44 PM By:  Grover Canavan Entered By: Darci Needle on 12/24/2019 11:31:22 [name] ([id number]) -------------------------------------------------------------------------------- Vitals Details Patient Name: [name] Date of Service: 12/24/2019 11:00 AM Medical Record Number: [id number] Patient Account Number: [id number] Date of Birth/Sex: [birth year] (74 y.o. M) Treating RN: Grover Canavan Primary Care Nachmen Mansel: [name] Other Clinician: Referring Doug Bucklin: [name] Treating Fayelynn Distel/Extender:  [name], HOYT Weeks in Treatment: 3 Vital Signs Time Taken: 11:20 Temperature (F): 97.4 Height (in): 71 Pulse (bpm): 91 Weight (lbs): 190 Respiratory Rate (breaths/min): 18 Body Mass Index (BMI): 26.5 Blood Pressure (mmHg): 112/71 Reference Range: 80 - 120 mg / dl Electronic Signature(s) Signed: 12/24/2019 2:11:58 PM By: Darci Needle Entered By: Darci Needle on 12/24/2019 11:29:11

## 2019-12-24 NOTE — Patient Instructions (Signed)

## 2019-12-25 ENCOUNTER — Inpatient Hospital Stay: Payer: Medicare Other

## 2019-12-25 ENCOUNTER — Other Ambulatory Visit: Payer: Self-pay

## 2019-12-25 ENCOUNTER — Ambulatory Visit: Payer: Medicare Other

## 2019-12-25 DIAGNOSIS — D649 Anemia, unspecified: Secondary | ICD-10-CM

## 2019-12-25 DIAGNOSIS — C3431 Malignant neoplasm of lower lobe, right bronchus or lung: Secondary | ICD-10-CM | POA: Diagnosis not present

## 2019-12-25 DIAGNOSIS — Z5111 Encounter for antineoplastic chemotherapy: Secondary | ICD-10-CM | POA: Diagnosis not present

## 2019-12-25 DIAGNOSIS — Z5189 Encounter for other specified aftercare: Secondary | ICD-10-CM | POA: Diagnosis not present

## 2019-12-25 MED ORDER — DIPHENHYDRAMINE HCL 25 MG PO CAPS
25.0000 mg | ORAL_CAPSULE | Freq: Once | ORAL | Status: AC
  Administered 2019-12-25: 25 mg via ORAL

## 2019-12-25 MED ORDER — SODIUM CHLORIDE 0.9% IV SOLUTION
250.0000 mL | Freq: Once | INTRAVENOUS | Status: AC
  Administered 2019-12-25: 250 mL via INTRAVENOUS
  Filled 2019-12-25: qty 250

## 2019-12-25 MED ORDER — ACETAMINOPHEN 325 MG PO TABS
ORAL_TABLET | ORAL | Status: AC
  Filled 2019-12-25: qty 2

## 2019-12-25 MED ORDER — HEPARIN SOD (PORK) LOCK FLUSH 100 UNIT/ML IV SOLN
500.0000 [IU] | Freq: Every day | INTRAVENOUS | Status: AC | PRN
  Administered 2019-12-25: 500 [IU]
  Filled 2019-12-25: qty 5

## 2019-12-25 MED ORDER — DIPHENHYDRAMINE HCL 25 MG PO CAPS
ORAL_CAPSULE | ORAL | Status: AC
  Filled 2019-12-25: qty 1

## 2019-12-25 MED ORDER — SODIUM CHLORIDE 0.9% FLUSH
10.0000 mL | INTRAVENOUS | Status: AC | PRN
  Administered 2019-12-25: 10 mL
  Filled 2019-12-25: qty 10

## 2019-12-25 MED ORDER — ACETAMINOPHEN 325 MG PO TABS
650.0000 mg | ORAL_TABLET | Freq: Once | ORAL | Status: AC
  Administered 2019-12-25: 650 mg via ORAL

## 2019-12-25 NOTE — Patient Instructions (Signed)
https://www.[facility].org/donate-blood/blood-donation-process/what-happens-to-donated-blood/blood-transfusions/types-of-blood-transfusions.html"> https://www.[facility].org/donate-blood/blood-donation-process/what-happens-to-donated-blood/blood-transfusions/risks-complications.html">  Blood Transfusion, Adult, Care After This sheet gives you information about how to care for yourself after your procedure. Your health care provider may also give you more specific instructions. If you have problems or questions, contact your health care provider. What can I expect after the procedure? After the procedure, it is common to have:  Bruising and soreness where the IV was inserted.  A fever or chills on the day of the procedure. This may be your body's response to the new blood cells received.  A headache. Follow these instructions at home: IV insertion site care      Follow instructions from your health care provider about how to take care of your IV insertion site. Make sure you: ? Wash your hands with soap and water before and after you change your bandage (dressing). If soap and water are not available, use hand sanitizer. ? Change your dressing as told by your health care provider.  Check your IV insertion site every day for signs of infection. Check for: ? Redness, swelling, or pain. ? Bleeding from the site. ? Warmth. ? Pus or a bad smell. General instructions  Take over-the-counter and prescription medicines only as told by your health care provider.  Rest as told by your health care provider.  Return to your normal activities as told by your health care provider.  Keep all follow-up visits as told by your health care provider. This is important. Contact a health care provider if:  You have itching or red, swollen areas of skin (hives).  You feel anxious.  You feel weak after doing your normal activities.  You have redness, swelling, warmth, or pain around the IV  insertion site.  You have blood coming from the IV insertion site that does not stop with pressure.  You have pus or a bad smell coming from your IV insertion site. Get help right away if:  You have symptoms of a serious allergic or immune system reaction, including: ? Trouble breathing or shortness of breath. ? Swelling of the face or feeling flushed. ? Fever or chills. ? Pain in the head, back, or chest. ? Dark urine or blood in the urine. ? Widespread rash. ? Fast heartbeat. ? Feeling dizzy or light-headed. If you receive your blood transfusion in an outpatient setting, you will be told whom to contact to report any reactions. These symptoms may represent a serious problem that is an emergency. Do not wait to see if the symptoms will go away. Get medical help right away. Call your local emergency services (911 in the [place] Do not drive yourself to the hospital. Summary  Bruising and tenderness around the IV insertion site are common.  Check your IV insertion site every day for signs of infection.  Rest as told by your health care provider. Return to your normal activities as told by your health care provider.  Get help right away for symptoms of a serious allergic or immune system reaction to blood transfusion. This information is not intended to replace advice given to you by your health care provider. Make sure you discuss any questions you have with your health care provider. Document Revised: 09/21/2018 Document Reviewed: 09/21/2018 [employer] Patient Education  2020 [employer]  

## 2019-12-26 ENCOUNTER — Ambulatory Visit: Payer: Medicare Other

## 2019-12-26 DIAGNOSIS — Z794 Long term (current) use of insulin: Secondary | ICD-10-CM | POA: Diagnosis not present

## 2019-12-26 DIAGNOSIS — Z9181 History of falling: Secondary | ICD-10-CM | POA: Diagnosis not present

## 2019-12-26 DIAGNOSIS — W010XXD Fall on same level from slipping, tripping and stumbling without subsequent striking against object, subsequent encounter: Secondary | ICD-10-CM | POA: Diagnosis not present

## 2019-12-26 DIAGNOSIS — L02612 Cutaneous abscess of left foot: Secondary | ICD-10-CM | POA: Diagnosis not present

## 2019-12-26 DIAGNOSIS — R000 Tachycardia, unspecified: Secondary | ICD-10-CM | POA: Diagnosis not present

## 2019-12-26 DIAGNOSIS — G4733 Obstructive sleep apnea (adult) (pediatric): Secondary | ICD-10-CM | POA: Diagnosis not present

## 2019-12-26 DIAGNOSIS — S2232XD Fracture of one rib, left side, subsequent encounter for fracture with routine healing: Secondary | ICD-10-CM | POA: Diagnosis not present

## 2019-12-26 DIAGNOSIS — E1142 Type 2 diabetes mellitus with diabetic polyneuropathy: Secondary | ICD-10-CM | POA: Diagnosis not present

## 2019-12-26 DIAGNOSIS — N1831 Chronic kidney disease, stage 3a: Secondary | ICD-10-CM | POA: Diagnosis not present

## 2019-12-26 DIAGNOSIS — E1165 Type 2 diabetes mellitus with hyperglycemia: Secondary | ICD-10-CM | POA: Diagnosis not present

## 2019-12-26 DIAGNOSIS — J45909 Unspecified asthma, uncomplicated: Secondary | ICD-10-CM | POA: Diagnosis not present

## 2019-12-26 DIAGNOSIS — L97429 Non-pressure chronic ulcer of left heel and midfoot with unspecified severity: Secondary | ICD-10-CM | POA: Diagnosis not present

## 2019-12-26 DIAGNOSIS — Z87891 Personal history of nicotine dependence: Secondary | ICD-10-CM | POA: Diagnosis not present

## 2019-12-26 DIAGNOSIS — I129 Hypertensive chronic kidney disease with stage 1 through stage 4 chronic kidney disease, or unspecified chronic kidney disease: Secondary | ICD-10-CM | POA: Diagnosis not present

## 2019-12-26 DIAGNOSIS — E11621 Type 2 diabetes mellitus with foot ulcer: Secondary | ICD-10-CM | POA: Diagnosis not present

## 2019-12-26 DIAGNOSIS — E041 Nontoxic single thyroid nodule: Secondary | ICD-10-CM | POA: Diagnosis not present

## 2019-12-26 DIAGNOSIS — C3491 Malignant neoplasm of unspecified part of right bronchus or lung: Secondary | ICD-10-CM | POA: Diagnosis not present

## 2019-12-26 LAB — TYPE AND SCREEN
ABO/RH(D): A POS
Antibody Screen: NEGATIVE
Unit division: 0
Unit division: 0

## 2019-12-26 LAB — BPAM RBC
Blood Product Expiration Date: 202110052359
Blood Product Expiration Date: 202110052359
ISSUE DATE / TIME: 202109141235
ISSUE DATE / TIME: 202109141235
Unit Type and Rh: 6200
Unit Type and Rh: 6200

## 2019-12-27 ENCOUNTER — Encounter (HOSPITAL_COMMUNITY): Payer: Self-pay

## 2019-12-27 ENCOUNTER — Ambulatory Visit (HOSPITAL_COMMUNITY)
Admission: RE | Admit: 2019-12-27 | Discharge: 2019-12-27 | Disposition: A | Discharge status: Home / Self Care | Payer: Medicare Other | Source: Ambulatory Visit | Attending: Internal Medicine | Admitting: Internal Medicine

## 2019-12-27 ENCOUNTER — Other Ambulatory Visit: Payer: Self-pay

## 2019-12-27 DIAGNOSIS — C3490 Malignant neoplasm of unspecified part of unspecified bronchus or lung: Secondary | ICD-10-CM | POA: Diagnosis not present

## 2019-12-27 DIAGNOSIS — J439 Emphysema, unspecified: Secondary | ICD-10-CM | POA: Diagnosis not present

## 2019-12-27 DIAGNOSIS — I2510 Atherosclerotic heart disease of native coronary artery without angina pectoris: Secondary | ICD-10-CM | POA: Diagnosis not present

## 2019-12-27 DIAGNOSIS — I700 Atherosclerosis of aorta: Secondary | ICD-10-CM | POA: Diagnosis not present

## 2019-12-27 DIAGNOSIS — C3491 Malignant neoplasm of unspecified part of right bronchus or lung: Secondary | ICD-10-CM | POA: Diagnosis not present

## 2019-12-27 MED ORDER — HEPARIN SOD (PORK) LOCK FLUSH 100 UNIT/ML IV SOLN: 500.0000 [IU] | Freq: Once | INTRAVENOUS | Status: DC

## 2019-12-27 MED ORDER — HEPARIN SOD (PORK) LOCK FLUSH 100 UNIT/ML IV SOLN
INTRAVENOUS | Status: AC
  Administered 2019-12-27: 500 [IU] via INTRAVENOUS
  Filled 2019-12-27: qty 5

## 2019-12-27 MED ORDER — IOHEXOL 300 MG/ML  SOLN
75.0000 mL | Freq: Once | INTRAMUSCULAR | Status: AC | PRN
  Administered 2019-12-27: 75 mL via INTRAVENOUS

## 2019-12-28 ENCOUNTER — Ambulatory Visit: Payer: Medicare Other

## 2019-12-28 DIAGNOSIS — E11621 Type 2 diabetes mellitus with foot ulcer: Secondary | ICD-10-CM | POA: Diagnosis not present

## 2019-12-28 DIAGNOSIS — Z794 Long term (current) use of insulin: Secondary | ICD-10-CM | POA: Diagnosis not present

## 2019-12-28 DIAGNOSIS — I129 Hypertensive chronic kidney disease with stage 1 through stage 4 chronic kidney disease, or unspecified chronic kidney disease: Secondary | ICD-10-CM | POA: Diagnosis not present

## 2019-12-28 DIAGNOSIS — C3491 Malignant neoplasm of unspecified part of right bronchus or lung: Secondary | ICD-10-CM | POA: Diagnosis not present

## 2019-12-28 DIAGNOSIS — E1165 Type 2 diabetes mellitus with hyperglycemia: Secondary | ICD-10-CM | POA: Diagnosis not present

## 2019-12-28 DIAGNOSIS — E041 Nontoxic single thyroid nodule: Secondary | ICD-10-CM | POA: Diagnosis not present

## 2019-12-28 DIAGNOSIS — L02612 Cutaneous abscess of left foot: Secondary | ICD-10-CM | POA: Diagnosis not present

## 2019-12-28 DIAGNOSIS — L97429 Non-pressure chronic ulcer of left heel and midfoot with unspecified severity: Secondary | ICD-10-CM | POA: Diagnosis not present

## 2019-12-28 DIAGNOSIS — N1831 Chronic kidney disease, stage 3a: Secondary | ICD-10-CM | POA: Diagnosis not present

## 2019-12-28 DIAGNOSIS — R000 Tachycardia, unspecified: Secondary | ICD-10-CM | POA: Diagnosis not present

## 2019-12-28 DIAGNOSIS — S2232XD Fracture of one rib, left side, subsequent encounter for fracture with routine healing: Secondary | ICD-10-CM | POA: Diagnosis not present

## 2019-12-28 DIAGNOSIS — J45909 Unspecified asthma, uncomplicated: Secondary | ICD-10-CM | POA: Diagnosis not present

## 2019-12-28 DIAGNOSIS — G4733 Obstructive sleep apnea (adult) (pediatric): Secondary | ICD-10-CM | POA: Diagnosis not present

## 2019-12-28 DIAGNOSIS — Z9181 History of falling: Secondary | ICD-10-CM | POA: Diagnosis not present

## 2019-12-28 DIAGNOSIS — E1142 Type 2 diabetes mellitus with diabetic polyneuropathy: Secondary | ICD-10-CM | POA: Diagnosis not present

## 2019-12-28 DIAGNOSIS — W010XXD Fall on same level from slipping, tripping and stumbling without subsequent striking against object, subsequent encounter: Secondary | ICD-10-CM | POA: Diagnosis not present

## 2019-12-28 DIAGNOSIS — Z87891 Personal history of nicotine dependence: Secondary | ICD-10-CM | POA: Diagnosis not present

## 2019-12-29 NOTE — Progress Notes (Signed)
[facility] OFFICE PROGRESS NOTE  [name], MD [place] [address]  DIAGNOSIS: Limited stage small cell lung cancer.He presented with a large right lower lobe/right hilar mass with obstruction of the right lower lobe bronchus. The patient also has associated bulky right lower paratracheal and subcarinal adenopathy.He was diagnosed in July 2021.  PRIOR THERAPY: 1)  Weekly concurrent chemoradiation with carboplatin for an AUC of 2 and paclitaxel 45 mg/m.First dose 10/29/2019. Status post 1 cycle. Discontinued due to change in diagnosis.  CURRENT THERAPY: Systemic chemotherapy with carboplatin for an AUC of 5 on day 1 and etoposide 100 mg per metered squared on days 1, 2, and 3 IV every 3 weeks with Neulasta support. First dose expected on 11/12/2019. Status post 2 cycle. Undergoing concurrent radiation under the care of Dr. Lisbeth Renshaw for the obstructive lung mass.   INTERVAL HISTORY: ESTILL LLERENA 74 y.o. male returns to the clinic for a follow up visit. The patient is feeling fairly well today without any concerning complaints except for headaches. He states he has been getting a dull headache almost daily which started a few weeks ago. He states his headache is primarily  at night. He denies any changes with his gait, vision, or seizures. He had a staging brain MRI on 11/03/19 which was negative for metastatic disease to the brain.   Otherwise, in the interval, he is established with wound care for his recent lower extremity infection. They come by his house 3x per week.   Today, the patient denies any fevers, chills, night sweats, or weight loss. He notes his throat "does not feel right" but he is unable to elaborate. He denies dysphagia or odynophagia. He states his throat feels "hollow". He estimates that he completed radiotherapy 3 weeks ago. He reports a mild cough but denies shortness of breath. Denies chest pain or hemoptysis. Denies  nausea, vomiting, or constipation. He has diarrhea 1-2x per week and takes imodium if needed. He recently had a restaging CT scan performed. He is here for evaluation and to review his scan results before starting cycle #3.   MEDICAL HISTORY: Past Medical History:  Diagnosis Date  . Allergic rhinitis due to pollen   . Cancer ([place])   . Chronic kidney disease, stage III (moderate) 12/19/2012  . Depression   . Diabetes mellitus ([place])   . Diverticulosis   . Erectile dysfunction associated with type 2 diabetes mellitus ([place]) 05/28/2015  . Former very heavy cigarette smoker (more than 40 per day) 10/07/2014  . GERD (gastroesophageal reflux disease)   . Gout   . Hyperlipidemia   . Hypertension   . Hypogonadism male 06/14/2012  . Iron deficiency   . Memory loss   . Osteoarthritis   . Personal history of colonic polyps    1996    ALLERGIES:  is allergic to tetracycline.  MEDICATIONS:  Current Outpatient Medications  Medication Sig Dispense Refill  . acetaminophen (QC ACETAMINOPHEN 8HR ARTH PAIN) 650 MG CR tablet Take 650 mg by mouth every 8 (eight) hours as needed for pain.    [name] Kitchen ampicillin (PRINCIPEN) 500 MG capsule Take 500 mg by mouth daily as needed (flare up).     . Ascorbic Acid (VITAMIN C) 1000 MG tablet Take 1,000 mg by mouth daily.    [name] Kitchen aspirin EC 81 MG tablet Take 81 mg by mouth daily. Swallow whole.    [name] Kitchen atenolol (TENORMIN) 50 MG tablet Take 50 mg by mouth daily.    [name] Kitchen  cholecalciferol (VITAMIN D) 1000 UNITS tablet Take 2,000 Units by mouth daily.     . ciprofloxacin (CIPRO) 500 MG tablet Take 1 tablet (500 mg total) by mouth 2 (two) times daily. 4 tablet 0  . donepezil (ARICEPT) 10 MG tablet TAKE 1 TABLET BY MOUTH AT BEDTIME (Patient taking differently: Take 10 mg by mouth at bedtime. ) 90 tablet 3  . fluticasone (FLONASE) 50 MCG/ACT nasal spray PLACE 2 SPRAYS IN EACH NOSTRIL DAILY (Patient taking differently: Place 2 sprays into both nostrils daily as needed for allergies or  rhinitis. ) 48 mL 3  . Hyprom-Naphaz-Polysorb-Zn Sulf (CLEAR EYES COMPLETE OP) Place 1 drop into both eyes daily as needed (dry eyes).    . Insulin Pen Needle (PEN NEEDLES) 31G X 5 MM MISC 1 each by Does not apply route 3 (three) times daily. To inject insulin E11.42 90 each 2  . insulin regular human CONCENTRATED (HUMULIN R U-500 KWIKPEN) 500 UNIT/ML kwikpen Inject under skin 80-90 units before b'fast, 60 units before lunch, 80-90 units vefore dinner (Patient taking differently: Inject 20-40 Units into the skin See admin instructions. Takes 35-40 units before breakfast, 20 units at lunch and 35-40 units at night) 6 pen 5  . lisinopril (ZESTRIL) 10 MG tablet Take 10 mg by mouth daily.    . memantine (NAMENDA) 10 MG tablet TAKE 1 TABLET BY MOUTH TWICE A DAY (Patient taking differently: Take 10 mg by mouth 2 (two) times daily. ) 180 tablet 3  . montelukast (SINGULAIR) 10 MG tablet Take 1 tablet (10 mg total) by mouth at bedtime. 30 tablet 3  . omeprazole (PRILOSEC) 40 MG capsule Take 1 capsule (40 mg total) by mouth 2 (two) times daily before a meal. (Patient taking differently: Take 20 mg by mouth daily. Taking OTC daily.) 180 capsule 1  . prochlorperazine (COMPAZINE) 10 MG tablet Take 1 tablet (10 mg total) by mouth every 6 (six) hours as needed. (Patient taking differently: Take 10 mg by mouth every 6 (six) hours as needed for nausea. ) 30 tablet 2  . simvastatin (ZOCOR) 40 MG tablet TAKE 1 TABLET BY MOUTH AT BEDTIME (Patient taking differently: Take 40 mg by mouth every evening. ) 90 tablet 2  . sucralfate (CARAFATE) 1 g tablet Take 1 tablet (1 g total) by mouth 4 (four) times daily. Dissolve each tablet in 15 cc water before use. 120 tablet 2  . venlafaxine XR (EFFEXOR-XR) 75 MG 24 hr capsule TAKE 1 CAPSULE BY MOUTH EVERY MORNING AND 2 CAPSULES AT BEDTIME 270 capsule 1  . potassium chloride SA (KLOR-CON) 20 MEQ tablet Take 1 tablet (20 mEq total) by mouth daily. 7 tablet 0   No current  facility-administered medications for this visit.   Facility-Administered Medications Ordered in Other Visits  Medication Dose Route Frequency Provider Last Rate Last Admin  . 0.9 %  sodium chloride infusion   Intravenous Once Curt Bears, MD      . 0.9 % NaCl with KCl 20 mEq/ L  infusion   Intravenous Once Tino Ronan L, [name]      . CARBOplatin (PARAPLATIN) 420 mg in sodium chloride 0.9 % 250 mL chemo infusion  420 mg Intravenous Once Curt Bears, MD      . dexamethasone (DECADRON) 10 mg in sodium chloride 0.9 % 50 mL IVPB  10 mg Intravenous Once Curt Bears, MD      . etoposide (VEPESID) 200 mg in sodium chloride 0.9 % 500 mL chemo infusion  97 mg/m2 (  Treatment Plan Recorded) Intravenous Once Curt Bears, MD      . fosaprepitant (EMEND) 150 mg in sodium chloride 0.9 % 145 mL IVPB  150 mg Intravenous Once Curt Bears, MD      . heparin lock flush 100 unit/mL  500 Units Intracatheter Once PRN Curt Bears, MD      . palonosetron (ALOXI) injection 0.25 mg  0.25 mg Intravenous Once Curt Bears, MD      . sodium chloride flush (NS) 0.9 % injection 10 mL  10 mL Intracatheter PRN Curt Bears, MD        SURGICAL HISTORY:  Past Surgical History:  Procedure Laterality Date  . BRONCHIAL NEEDLE ASPIRATION BIOPSY  10/17/2019   Procedure: BRONCHIAL NEEDLE ASPIRATION BIOPSIES;  Surgeon: Juanito Doom, MD;  Location: [facility];  Service: Cardiopulmonary;;  . CARDIAC CATHETERIZATION  11/2011   [facility]  . CHOLECYSTECTOMY  1980  . ENDOBRONCHIAL ULTRASOUND Bilateral 10/17/2019   Procedure: ENDOBRONCHIAL ULTRASOUND;  Surgeon: Juanito Doom, MD;  Location: [facility];  Service: Cardiopulmonary;  Laterality: Bilateral;  . ESOPHAGOGASTRODUODENOSCOPY (EGD) WITH PROPOFOL N/A 09/27/2017   Procedure: ESOPHAGOGASTRODUODENOSCOPY (EGD) WITH PROPOFOL;  Surgeon: Lin Landsman, MD;  Location: [facility];  Service: Gastroenterology;  Laterality: N/A;  .  EYE SURGERY Right 07/29/2016   cataract - Dr. Manuella Ghazi  . EYE SURGERY Left 08/23/2106   cataract - Dr. Manuella Ghazi  . HEMOSTASIS CONTROL  10/17/2019   Procedure: HEMOSTASIS CONTROL;  Surgeon: Juanito Doom, MD;  Location: [facility];  Service: Cardiopulmonary;;  . IR IMAGING GUIDED PORT INSERTION  11/30/2019  . IRRIGATION AND DEBRIDEMENT ABSCESS Left 11/23/2019   Procedure: IRRIGATION AND DEBRIDEMENT ABSCESS;  Surgeon: Melina Schools, MD;  Location: [facility];  Service: Orthopedics;  Laterality: Left;  . TOTAL HIP ARTHROPLASTY  2009   Dr. Gladstone Lighter  . TOTAL HIP ARTHROPLASTY      REVIEW OF SYSTEMS:   Review of Systems  Constitutional: Negative for appetite change, chills, fatigue, fever and unexpected weight change.  HENT: Positive for throat feeling "hollow". Negative for mouth sores, nosebleeds, sore throat and trouble swallowing.   Eyes: Negative for eye problems and icterus.  Respiratory: Positive for mild cough. Negative for hemoptysis, shortness of breath and wheezing.   Cardiovascular: Negative for chest pain and leg swelling.  Gastrointestinal: Positive for mild diarrhea. Negative for abdominal pain, constipation,  nausea and vomiting.  Genitourinary: Negative for bladder incontinence, difficulty urinating, dysuria, frequency and hematuria.   Musculoskeletal: Negative for back pain, gait problem, neck pain and neck stiffness.  Skin: Negative for itching and rash.  Neurological: Positive for headaches. Negative for dizziness, extremity weakness, gait problem, light-headedness and seizures.  Hematological: Negative for adenopathy. Does not bruise/bleed easily.  Psychiatric/Behavioral: Negative for confusion, depression and sleep disturbance. The patient is not nervous/anxious.     PHYSICAL EXAMINATION:  Blood pressure 114/74, pulse 86, temperature (!) 97 F (36.1 C), temperature source Tympanic, resp. rate 18, height 5\' 11"  (1.803 m), weight 185 lb 14.4 oz (84.3 kg), SpO2 99 %.  ECOG  PERFORMANCE STATUS: 2 - Symptomatic, <50% confined to bed  Physical Exam  Constitutional: Oriented to person, place, and time and well-developed, well-nourished, and in no distress.  HENT:  Head: Normocephalic and atraumatic.  Mouth/Throat: Oropharynx is clear and moist. No oropharyngeal exudate.  Eyes: Conjunctivae are normal. Right eye exhibits no discharge. Left eye exhibits no discharge. No scleral icterus.  Neck: Normal range of motion. Neck supple.  Cardiovascular: Normal rate, regular rhythm, normal heart sounds  and intact distal pulses.   Pulmonary/Chest: Effort normal and breath sounds normal. No respiratory distress. No wheezes. No rales.  Abdominal: Soft. Bowel sounds are normal. Exhibits no distension and no mass. There is no tenderness.  Musculoskeletal: Normal range of motion. Exhibits no edema.  Lymphadenopathy:    No cervical adenopathy.  Neurological: Alert and oriented to person, place, and time. Exhibits normal muscle tone. Examined in the wheelchair. Cranial nerves II-XII grossly intact. Upper and lower extremity strength equal and symmetric.  Skin: Skin is warm and dry. No rash noted. Not diaphoretic. No erythema. No pallor.  Psychiatric: Mood, memory and judgment normal.  Vitals reviewed.  LABORATORY DATA: Lab Results  Component Value Date   WBC 9.9 12/31/2019   HGB 11.2 (L) 12/31/2019   HCT 32.8 (L) 12/31/2019   MCV 84.1 12/31/2019   PLT 210 12/31/2019      Chemistry      Component Value Date/Time   NA 135 12/31/2019 1122   NA 130 (L) 11/25/2011 1119   NA 137 11/16/2011 0513   K 3.0 (LL) 12/31/2019 1122   K 4.1 11/16/2011 0513   CL 98 12/31/2019 1122   CL 100 11/16/2011 0513   CO2 30 12/31/2019 1122   CO2 29 11/16/2011 0513   BUN 13 12/31/2019 1122   BUN 25 11/25/2011 1119   BUN 15 11/16/2011 0513   CREATININE 1.36 (H) 12/31/2019 1122   CREATININE 1.26 11/16/2011 0513      Component Value Date/Time   CALCIUM 8.7 (L) 12/31/2019 1122   CALCIUM  9.1 11/16/2011 0513   ALKPHOS 109 12/31/2019 1122   AST 11 (L) 12/31/2019 1122   ALT 13 12/31/2019 1122   BILITOT 0.4 12/31/2019 1122       RADIOGRAPHIC STUDIES:  CT Chest W Contrast  Result Date: 12/27/2019 CLINICAL DATA:  Primary Cancer Type: Small cell lung cancer, staging. Imaging Indication: Assess response to therapy Interval therapy since last imaging? Yes Initial Cancer Diagnosis Date: 10/17/2019; Established by: Biopsy-proven Detailed Pathology: Limited stage small cell lung cancer. Primary Tumor location: Right lower lobe/right hilar mass with obstruction of the right lower lobe bronchus. Surgeries: No Chemotherapy: Yes; Ongoing? Yes; Most recent administration: 12/11/2019 Immunotherapy? No Radiation therapy? Yes; Date Range: 10/18/2019 - 11/28/2019; Target: Right lung EXAM: CT CHEST WITH CONTRAST TECHNIQUE: Multidetector CT imaging of the chest was performed during intravenous contrast administration. CONTRAST:  56mL OMNIPAQUE IOHEXOL 300 MG/ML  SOLN COMPARISON:  CT chest 11/15/2019.  11/01/2019 PET-CT. FINDINGS: Cardiovascular: Normal heart size. No pericardial effusion. Aortic atherosclerosis and coronary artery calcifications. Mediastinum/Nodes: 9 mm low-density nodule in right lobe of thyroid gland noted. Not clinically significant; no follow-up imaging recommended (ref: J Am Coll Radiol. 2015 Feb;12(2): 143-50). The trachea appears patent and is midline. Normal appearance of the esophagus. No axillary or supraclavicular adenopathy. Right paratracheal lymph node measures 2 cm, image 51/2. This is compared with 2.4 cm on 11/15/2019 and 2.8 cm on 10/11/2018 index subcarinal lymph node measures 1.5 cm, image 73/2. This is compared with 1.7 cm on 11/15/2019 and 2.2 cm on 10/11/2019. Lungs/Pleura: Mild changes of emphysema. The right perihilar lung mass is again identified, image 66/2. This measures 4.9 x 3.7 by 3.8 cm, image 90/5. On 10/11/2019 this measured 5.7 x 5.6 by 6.4 cm. Part solid  nodule within the periphery of the right lower lobe measures 1.1 cm, image 108/7. This is compared with 1.3 cm previously. 3 mm nodule within the central aspect of the right upper lobe is stable,  image 42/7. Upper Abdomen: Normal appearance of the adrenal glands. Previous cholecystectomy. No acute findings identified within the imaged portions of the upper abdomen. Musculoskeletal: No chest wall abnormality. No acute or significant osseous findings. IMPRESSION: 1. Interval response to therapy. The right perihilar lung mass has decreased in size in the interval. The enlarged right paratracheal and subcarinal lymph nodes are also decreased in size in the interval. 2. The sub solid nodule within the periphery of the right lower lobe is slightly decreased in size in the interval. 3. Coronary artery calcifications. Aortic Atherosclerosis (ICD10-I70.0) and Emphysema (ICD10-J43.9). Electronically Signed   By: Kerby Moors M.D.   On: 12/27/2019 10:02     ASSESSMENT/PLAN:  This is a very pleasant 74 year old Caucasian male diagnosed withlimited stage small cell lung cancer.He presented with a large right lower lobe/right hilar mass with obstruction of the right lower lobe bronchus. The patient also has associated bulky right lower paratracheal and subcarinal adenopathy.He was diagnosed in July 2021.  The patient had 1 cycle of concurrent chemoradiation with carboplatin for an AUC of 2 and paclitaxel 45 mg/m. He tolerated it wellwithout any concerning complaints. The patient undergoes radiotherapy under the care of Dr. Lisbeth Renshaw and Ebony Hail. This treatment was discontinued due to change in his final pathology report.   He is currently undergoing systemic chemotherapy with carboplatin for an AUC of 5 on day 1and etoposide 100 mg per metered squared on days 1, 2, and 3 IV every 3 weeks with Neulasta support. Given the patient's CKD, the patient is not a candidate for cisplatin. He is status post 2 cycles.    The patient was seen with Dr. [name]. The patient recently had a restaging CT scan. Dr. [name] personally and independently reviewed the scan and discussed the results with the patient. The scan showed a positive response to therapy. Dr. [name] recommends that he continue on the same treatment at the same dose. He will proceed with cycle #3 today as scheduled.   We will see him back for a follow up visit in 3 weeks for evaluation before starting cycle #4.   I discussed the patient's headaches with Dr. [name]. He does not believe this is secondary to metastatic disease to the brain given that the patient had a staging brain MRI on 11/03/19 which was negative. However, Dr. [name] did discuss if he has new or worsening headaches that we may consider a repeat brain MRI.   The patient has hypokalemia today with a potassium of 3.0. I will arrange for the patient to have 20 meq of potassium chloride IV today. I have also sent a prescription for 20 meq of potassium chloride to his pharmacy to take 1 tablet daily for 7 days.   The patient was advised to call immediately if he has any concerning symptoms in the interval. The patient voices understanding of current disease status and treatment options and is in agreement with the current care plan. All questions were answered. The patient knows to call the clinic with any problems, questions or concerns. We can certainly see the patient much sooner if necessary  No orders of the defined types were placed in this encounter.    Airen Stiehl L Crystalyn Delia, [name] 12/31/19  ADDENDUM: Hematology/Oncology Attending: I had a face-to-face encounter with the patient today.  I recommended his care plan.  This is a very pleasant 74 years old white male diagnosed with limited stage small cell lung cancer and he is currently on systemic chemotherapy with carboplatin and  etoposide status post 2 cycles.  The patient is tolerating his treatment well with no  concerning adverse effect except for fatigue and also occasional headache.  His last MRI of the brain less than 2 months ago was negative for malignancy. He had repeat CT scan of the chest performed recently.  I personally and independently reviewed the scans and discussed the results with the patient today. His scan showed improvement of his disease decrease in the right perihilar mass as well as the mediastinal lymphadenopathy. I recommended for the patient to proceed with cycle #3 of his chemotherapy today as planned. I will see him back for follow-up visit in 3 weeks for evaluation before starting cycle #4. For the headache, the patient was advised to call immediately if he has worsening of his headache or no improvement in the next few weeks. He was advised to call immediately if he has any other concerning symptoms in the interval.  Disclaimer: This note was dictated with voice recognition software. Similar sounding words can inadvertently be transcribed and may be missed upon review. Eilleen Kempf, MD 12/31/19

## 2019-12-31 ENCOUNTER — Inpatient Hospital Stay (HOSPITAL_BASED_OUTPATIENT_CLINIC_OR_DEPARTMENT_OTHER): Payer: Medicare Other | Admitting: Physician Assistant

## 2019-12-31 ENCOUNTER — Other Ambulatory Visit: Payer: Self-pay

## 2019-12-31 ENCOUNTER — Inpatient Hospital Stay: Payer: Medicare Other

## 2019-12-31 ENCOUNTER — Encounter: Payer: Self-pay | Admitting: Physician Assistant

## 2019-12-31 VITALS — BP 114/74 | HR 86 | Temp 97.0°F | Resp 18 | Ht 71.0 in | Wt 185.9 lb

## 2019-12-31 DIAGNOSIS — C3490 Malignant neoplasm of unspecified part of unspecified bronchus or lung: Secondary | ICD-10-CM | POA: Diagnosis not present

## 2019-12-31 DIAGNOSIS — Z5111 Encounter for antineoplastic chemotherapy: Secondary | ICD-10-CM | POA: Diagnosis not present

## 2019-12-31 DIAGNOSIS — E876 Hypokalemia: Secondary | ICD-10-CM | POA: Diagnosis not present

## 2019-12-31 DIAGNOSIS — C3431 Malignant neoplasm of lower lobe, right bronchus or lung: Secondary | ICD-10-CM | POA: Diagnosis not present

## 2019-12-31 DIAGNOSIS — C3491 Malignant neoplasm of unspecified part of right bronchus or lung: Secondary | ICD-10-CM

## 2019-12-31 DIAGNOSIS — Z5189 Encounter for other specified aftercare: Secondary | ICD-10-CM | POA: Diagnosis not present

## 2019-12-31 LAB — CBC WITH DIFFERENTIAL (CANCER CENTER ONLY)
Abs Immature Granulocytes: 0.11 10*3/uL — ABNORMAL HIGH (ref 0.00–0.07)
Basophils Absolute: 0 10*3/uL (ref 0.0–0.1)
Basophils Relative: 0 %
Eosinophils Absolute: 0 10*3/uL (ref 0.0–0.5)
Eosinophils Relative: 0 %
HCT: 32.8 % — ABNORMAL LOW (ref 39.0–52.0)
Hemoglobin: 11.2 g/dL — ABNORMAL LOW (ref 13.0–17.0)
Immature Granulocytes: 1 %
Lymphocytes Relative: 7 %
Lymphs Abs: 0.7 10*3/uL (ref 0.7–4.0)
MCH: 28.7 pg (ref 26.0–34.0)
MCHC: 34.1 g/dL (ref 30.0–36.0)
MCV: 84.1 fL (ref 80.0–100.0)
Monocytes Absolute: 0.6 10*3/uL (ref 0.1–1.0)
Monocytes Relative: 6 %
Neutro Abs: 8.4 10*3/uL — ABNORMAL HIGH (ref 1.7–7.7)
Neutrophils Relative %: 86 %
Platelet Count: 210 10*3/uL (ref 150–400)
RBC: 3.9 MIL/uL — ABNORMAL LOW (ref 4.22–5.81)
RDW: 16.1 % — ABNORMAL HIGH (ref 11.5–15.5)
WBC Count: 9.9 10*3/uL (ref 4.0–10.5)
nRBC: 0 % (ref 0.0–0.2)

## 2019-12-31 LAB — CMP (CANCER CENTER ONLY)
ALT: 13 U/L (ref 0–44)
AST: 11 U/L — ABNORMAL LOW (ref 15–41)
Albumin: 3.1 g/dL — ABNORMAL LOW (ref 3.5–5.0)
Alkaline Phosphatase: 109 U/L (ref 38–126)
Anion gap: 7 (ref 5–15)
BUN: 13 mg/dL (ref 8–23)
CO2: 30 mmol/L (ref 22–32)
Calcium: 8.7 mg/dL — ABNORMAL LOW (ref 8.9–10.3)
Chloride: 98 mmol/L (ref 98–111)
Creatinine: 1.36 mg/dL — ABNORMAL HIGH (ref 0.61–1.24)
GFR, Est AFR Am: 59 mL/min — ABNORMAL LOW (ref >60)
GFR, Estimated: 51 mL/min — ABNORMAL LOW (ref >60)
Glucose, Bld: 155 mg/dL — ABNORMAL HIGH (ref 70–99)
Potassium: 3 mmol/L — CL (ref 3.5–5.1)
Sodium: 135 mmol/L (ref 135–145)
Total Bilirubin: 0.4 mg/dL (ref 0.3–1.2)
Total Protein: 6.7 g/dL (ref 6.5–8.1)

## 2019-12-31 MED ORDER — SODIUM CHLORIDE 0.9% FLUSH
10.0000 mL | INTRAVENOUS | Status: DC | PRN
  Administered 2019-12-31: 10 mL
  Filled 2019-12-31: qty 10

## 2019-12-31 MED ORDER — SODIUM CHLORIDE 0.9 % IV SOLN: 419.0000 mg | Freq: Once | INTRAVENOUS | Status: DC

## 2019-12-31 MED ORDER — SODIUM CHLORIDE 0.9 % IV SOLN
150.0000 mg | Freq: Once | INTRAVENOUS | Status: AC
  Administered 2019-12-31: 150 mg via INTRAVENOUS
  Filled 2019-12-31: qty 150

## 2019-12-31 MED ORDER — POTASSIUM CHLORIDE IN NACL 20-0.9 MEQ/L-% IV SOLN
Freq: Once | INTRAVENOUS | Status: AC
  Filled 2019-12-31: qty 1000

## 2019-12-31 MED ORDER — PALONOSETRON HCL INJECTION 0.25 MG/5ML
0.2500 mg | Freq: Once | INTRAVENOUS | Status: AC
  Administered 2019-12-31: 0.25 mg via INTRAVENOUS

## 2019-12-31 MED ORDER — SODIUM CHLORIDE 0.9 % IV SOLN
370.0000 mg | Freq: Once | INTRAVENOUS | Status: AC
  Administered 2019-12-31: 370 mg via INTRAVENOUS
  Filled 2019-12-31: qty 37

## 2019-12-31 MED ORDER — SODIUM CHLORIDE 0.9 % IV SOLN
10.0000 mg | Freq: Once | INTRAVENOUS | Status: AC
  Administered 2019-12-31: 10 mg via INTRAVENOUS
  Filled 2019-12-31: qty 10

## 2019-12-31 MED ORDER — SODIUM CHLORIDE 0.9 % IV SOLN
Freq: Once | INTRAVENOUS | Status: AC
  Filled 2019-12-31: qty 250

## 2019-12-31 MED ORDER — SODIUM CHLORIDE 0.9 % IV SOLN: Freq: Once | INTRAVENOUS | Status: DC

## 2019-12-31 MED ORDER — SODIUM CHLORIDE 0.9 % IV SOLN
97.0000 mg/m2 | Freq: Once | INTRAVENOUS | Status: AC
  Administered 2019-12-31: 200 mg via INTRAVENOUS
  Filled 2019-12-31: qty 10

## 2019-12-31 MED ORDER — PALONOSETRON HCL INJECTION 0.25 MG/5ML
INTRAVENOUS | Status: AC
  Filled 2019-12-31: qty 5

## 2019-12-31 MED ORDER — POTASSIUM CHLORIDE CRYS ER 20 MEQ PO TBCR: 20.0000 meq | EXTENDED_RELEASE_TABLET | Freq: Every day | ORAL | 0 refills | Status: DC

## 2019-12-31 MED ORDER — HEPARIN SOD (PORK) LOCK FLUSH 100 UNIT/ML IV SOLN
500.0000 [IU] | Freq: Once | INTRAVENOUS | Status: AC | PRN
  Administered 2019-12-31: 500 [IU]
  Filled 2019-12-31: qty 5

## 2019-12-31 NOTE — Progress Notes (Signed)
Continue Carbo 370mg  (AUC 4.6) despite changes in Scr.  Hardie Pulley, PharmD, BCOP

## 2019-12-31 NOTE — Patient Instructions (Signed)
[facility] Discharge Instructions for Patients Receiving Chemotherapy  Today you received the following chemotherapy agents: Etoposide & Carboplatin  To help prevent nausea and vomiting after your treatment, we encourage you to take your nausea medication as directed.    If you develop nausea and vomiting that is not controlled by your nausea medication, call the clinic.   BELOW ARE SYMPTOMS THAT SHOULD BE REPORTED IMMEDIATELY:  *FEVER GREATER THAN 100.5 F  *CHILLS WITH OR WITHOUT FEVER  NAUSEA AND VOMITING THAT IS NOT CONTROLLED WITH YOUR NAUSEA MEDICATION  *UNUSUAL SHORTNESS OF BREATH  *UNUSUAL BRUISING OR BLEEDING  TENDERNESS IN MOUTH AND THROAT WITH OR WITHOUT PRESENCE OF ULCERS  *URINARY PROBLEMS  *BOWEL PROBLEMS  UNUSUAL RASH Items with * indicate a potential emergency and should be followed up as soon as possible.  Feel free to call the clinic should you have any questions or concerns. The clinic phone number is [phone].  Please show the [facility] at check-in to the Emergency Department and triage nurse.

## 2020-01-01 ENCOUNTER — Inpatient Hospital Stay: Payer: Medicare Other

## 2020-01-01 ENCOUNTER — Other Ambulatory Visit: Payer: Self-pay

## 2020-01-01 VITALS — BP 165/87 | HR 84 | Temp 98.1°F | Resp 18

## 2020-01-01 DIAGNOSIS — I129 Hypertensive chronic kidney disease with stage 1 through stage 4 chronic kidney disease, or unspecified chronic kidney disease: Secondary | ICD-10-CM | POA: Diagnosis not present

## 2020-01-01 DIAGNOSIS — Z5111 Encounter for antineoplastic chemotherapy: Secondary | ICD-10-CM | POA: Diagnosis not present

## 2020-01-01 DIAGNOSIS — N1831 Chronic kidney disease, stage 3a: Secondary | ICD-10-CM | POA: Diagnosis not present

## 2020-01-01 DIAGNOSIS — C3431 Malignant neoplasm of lower lobe, right bronchus or lung: Secondary | ICD-10-CM | POA: Diagnosis not present

## 2020-01-01 DIAGNOSIS — C3491 Malignant neoplasm of unspecified part of right bronchus or lung: Secondary | ICD-10-CM

## 2020-01-01 DIAGNOSIS — G4733 Obstructive sleep apnea (adult) (pediatric): Secondary | ICD-10-CM | POA: Diagnosis not present

## 2020-01-01 DIAGNOSIS — E041 Nontoxic single thyroid nodule: Secondary | ICD-10-CM | POA: Diagnosis not present

## 2020-01-01 DIAGNOSIS — Z87891 Personal history of nicotine dependence: Secondary | ICD-10-CM | POA: Diagnosis not present

## 2020-01-01 DIAGNOSIS — J45909 Unspecified asthma, uncomplicated: Secondary | ICD-10-CM | POA: Diagnosis not present

## 2020-01-01 DIAGNOSIS — L02612 Cutaneous abscess of left foot: Secondary | ICD-10-CM | POA: Diagnosis not present

## 2020-01-01 DIAGNOSIS — L97429 Non-pressure chronic ulcer of left heel and midfoot with unspecified severity: Secondary | ICD-10-CM | POA: Diagnosis not present

## 2020-01-01 DIAGNOSIS — S2232XD Fracture of one rib, left side, subsequent encounter for fracture with routine healing: Secondary | ICD-10-CM | POA: Diagnosis not present

## 2020-01-01 DIAGNOSIS — R000 Tachycardia, unspecified: Secondary | ICD-10-CM | POA: Diagnosis not present

## 2020-01-01 DIAGNOSIS — Z9181 History of falling: Secondary | ICD-10-CM | POA: Diagnosis not present

## 2020-01-01 DIAGNOSIS — E11621 Type 2 diabetes mellitus with foot ulcer: Secondary | ICD-10-CM | POA: Diagnosis not present

## 2020-01-01 DIAGNOSIS — Z794 Long term (current) use of insulin: Secondary | ICD-10-CM | POA: Diagnosis not present

## 2020-01-01 DIAGNOSIS — E1142 Type 2 diabetes mellitus with diabetic polyneuropathy: Secondary | ICD-10-CM | POA: Diagnosis not present

## 2020-01-01 DIAGNOSIS — E1165 Type 2 diabetes mellitus with hyperglycemia: Secondary | ICD-10-CM | POA: Diagnosis not present

## 2020-01-01 DIAGNOSIS — W010XXD Fall on same level from slipping, tripping and stumbling without subsequent striking against object, subsequent encounter: Secondary | ICD-10-CM | POA: Diagnosis not present

## 2020-01-01 DIAGNOSIS — Z5189 Encounter for other specified aftercare: Secondary | ICD-10-CM | POA: Diagnosis not present

## 2020-01-01 MED ORDER — SODIUM CHLORIDE 0.9 % IV SOLN
10.0000 mg | Freq: Once | INTRAVENOUS | Status: AC
  Administered 2020-01-01: 10 mg via INTRAVENOUS
  Filled 2020-01-01: qty 10

## 2020-01-01 MED ORDER — SODIUM CHLORIDE 0.9 % IV SOLN
Freq: Once | INTRAVENOUS | Status: AC
  Filled 2020-01-01: qty 250

## 2020-01-01 MED ORDER — HEPARIN SOD (PORK) LOCK FLUSH 100 UNIT/ML IV SOLN
500.0000 [IU] | Freq: Once | INTRAVENOUS | Status: AC | PRN
  Administered 2020-01-01: 500 [IU]
  Filled 2020-01-01: qty 5

## 2020-01-01 MED ORDER — SODIUM CHLORIDE 0.9 % IV SOLN
96.0000 mg/m2 | Freq: Once | INTRAVENOUS | Status: AC
  Administered 2020-01-01: 200 mg via INTRAVENOUS
  Filled 2020-01-01: qty 10

## 2020-01-01 MED ORDER — SODIUM CHLORIDE 0.9% FLUSH
10.0000 mL | INTRAVENOUS | Status: DC | PRN
  Administered 2020-01-01: 10 mL
  Filled 2020-01-01: qty 10

## 2020-01-01 NOTE — Patient Instructions (Signed)
[facility] Discharge Instructions for Patients Receiving Chemotherapy  Today you received the following chemotherapy agents: Etoposide  To help prevent nausea and vomiting after your treatment, we encourage you to take your nausea medication  as prescribed.   If you develop nausea and vomiting that is not controlled by your nausea medication, call the clinic.   BELOW ARE SYMPTOMS THAT SHOULD BE REPORTED IMMEDIATELY:  *FEVER GREATER THAN 100.5 F  *CHILLS WITH OR WITHOUT FEVER  NAUSEA AND VOMITING THAT IS NOT CONTROLLED WITH YOUR NAUSEA MEDICATION  *UNUSUAL SHORTNESS OF BREATH  *UNUSUAL BRUISING OR BLEEDING  TENDERNESS IN MOUTH AND THROAT WITH OR WITHOUT PRESENCE OF ULCERS  *URINARY PROBLEMS  *BOWEL PROBLEMS  UNUSUAL RASH Items with * indicate a potential emergency and should be followed up as soon as possible.  Feel free to call the clinic should you have any questions or concerns. The clinic phone number is [phone].  Please show the [facility] at check-in to the Emergency Department and triage nurse.

## 2020-01-02 ENCOUNTER — Inpatient Hospital Stay: Payer: Medicare Other

## 2020-01-02 VITALS — BP 138/95 | HR 83 | Temp 98.2°F | Resp 16

## 2020-01-02 DIAGNOSIS — C3431 Malignant neoplasm of lower lobe, right bronchus or lung: Secondary | ICD-10-CM | POA: Diagnosis not present

## 2020-01-02 DIAGNOSIS — Z5111 Encounter for antineoplastic chemotherapy: Secondary | ICD-10-CM | POA: Diagnosis not present

## 2020-01-02 DIAGNOSIS — C3491 Malignant neoplasm of unspecified part of right bronchus or lung: Secondary | ICD-10-CM

## 2020-01-02 DIAGNOSIS — Z5189 Encounter for other specified aftercare: Secondary | ICD-10-CM | POA: Diagnosis not present

## 2020-01-02 MED ORDER — SODIUM CHLORIDE 0.9 % IV SOLN
10.0000 mg | Freq: Once | INTRAVENOUS | Status: AC
  Administered 2020-01-02: 10 mg via INTRAVENOUS
  Filled 2020-01-02: qty 10

## 2020-01-02 MED ORDER — HEPARIN SOD (PORK) LOCK FLUSH 100 UNIT/ML IV SOLN
500.0000 [IU] | Freq: Once | INTRAVENOUS | Status: DC | PRN
  Filled 2020-01-02: qty 5

## 2020-01-02 MED ORDER — SODIUM CHLORIDE 0.9% FLUSH
10.0000 mL | INTRAVENOUS | Status: DC | PRN
  Filled 2020-01-02: qty 10

## 2020-01-02 MED ORDER — SODIUM CHLORIDE 0.9 % IV SOLN
96.0000 mg/m2 | Freq: Once | INTRAVENOUS | Status: AC
  Administered 2020-01-02: 200 mg via INTRAVENOUS
  Filled 2020-01-02: qty 10

## 2020-01-02 MED ORDER — SODIUM CHLORIDE 0.9 % IV SOLN
Freq: Once | INTRAVENOUS | Status: AC
  Filled 2020-01-02: qty 250

## 2020-01-02 NOTE — Patient Instructions (Signed)
[facility] Discharge Instructions for Patients Receiving Chemotherapy  Today you received the following chemotherapy agents: Etoposide   To help prevent nausea and vomiting after your treatment, we encourage you to take your nausea medication as directed.    If you develop nausea and vomiting that is not controlled by your nausea medication, call the clinic.   BELOW ARE SYMPTOMS THAT SHOULD BE REPORTED IMMEDIATELY:  *FEVER GREATER THAN 100.5 F  *CHILLS WITH OR WITHOUT FEVER  NAUSEA AND VOMITING THAT IS NOT CONTROLLED WITH YOUR NAUSEA MEDICATION  *UNUSUAL SHORTNESS OF BREATH  *UNUSUAL BRUISING OR BLEEDING  TENDERNESS IN MOUTH AND THROAT WITH OR WITHOUT PRESENCE OF ULCERS  *URINARY PROBLEMS  *BOWEL PROBLEMS  UNUSUAL RASH Items with * indicate a potential emergency and should be followed up as soon as possible.  Feel free to call the clinic should you have any questions or concerns. The clinic phone number is [phone].  Please show the [facility] at check-in to the Emergency Department and triage nurse.

## 2020-01-03 DIAGNOSIS — Z87891 Personal history of nicotine dependence: Secondary | ICD-10-CM | POA: Diagnosis not present

## 2020-01-03 DIAGNOSIS — L97429 Non-pressure chronic ulcer of left heel and midfoot with unspecified severity: Secondary | ICD-10-CM | POA: Diagnosis not present

## 2020-01-03 DIAGNOSIS — L02612 Cutaneous abscess of left foot: Secondary | ICD-10-CM | POA: Diagnosis not present

## 2020-01-03 DIAGNOSIS — E1142 Type 2 diabetes mellitus with diabetic polyneuropathy: Secondary | ICD-10-CM | POA: Diagnosis not present

## 2020-01-03 DIAGNOSIS — S2232XD Fracture of one rib, left side, subsequent encounter for fracture with routine healing: Secondary | ICD-10-CM | POA: Diagnosis not present

## 2020-01-03 DIAGNOSIS — C3491 Malignant neoplasm of unspecified part of right bronchus or lung: Secondary | ICD-10-CM | POA: Diagnosis not present

## 2020-01-03 DIAGNOSIS — R000 Tachycardia, unspecified: Secondary | ICD-10-CM | POA: Diagnosis not present

## 2020-01-03 DIAGNOSIS — J45909 Unspecified asthma, uncomplicated: Secondary | ICD-10-CM | POA: Diagnosis not present

## 2020-01-03 DIAGNOSIS — E11621 Type 2 diabetes mellitus with foot ulcer: Secondary | ICD-10-CM | POA: Diagnosis not present

## 2020-01-03 DIAGNOSIS — E041 Nontoxic single thyroid nodule: Secondary | ICD-10-CM | POA: Diagnosis not present

## 2020-01-03 DIAGNOSIS — Z794 Long term (current) use of insulin: Secondary | ICD-10-CM | POA: Diagnosis not present

## 2020-01-03 DIAGNOSIS — N1831 Chronic kidney disease, stage 3a: Secondary | ICD-10-CM | POA: Diagnosis not present

## 2020-01-03 DIAGNOSIS — I129 Hypertensive chronic kidney disease with stage 1 through stage 4 chronic kidney disease, or unspecified chronic kidney disease: Secondary | ICD-10-CM | POA: Diagnosis not present

## 2020-01-03 DIAGNOSIS — E1165 Type 2 diabetes mellitus with hyperglycemia: Secondary | ICD-10-CM | POA: Diagnosis not present

## 2020-01-03 DIAGNOSIS — G4733 Obstructive sleep apnea (adult) (pediatric): Secondary | ICD-10-CM | POA: Diagnosis not present

## 2020-01-03 DIAGNOSIS — W010XXD Fall on same level from slipping, tripping and stumbling without subsequent striking against object, subsequent encounter: Secondary | ICD-10-CM | POA: Diagnosis not present

## 2020-01-03 DIAGNOSIS — Z9181 History of falling: Secondary | ICD-10-CM | POA: Diagnosis not present

## 2020-01-04 ENCOUNTER — Inpatient Hospital Stay: Payer: Medicare Other

## 2020-01-04 ENCOUNTER — Other Ambulatory Visit: Payer: Self-pay

## 2020-01-04 VITALS — BP 118/68 | HR 84 | Temp 98.5°F | Resp 18

## 2020-01-04 DIAGNOSIS — Z5111 Encounter for antineoplastic chemotherapy: Secondary | ICD-10-CM | POA: Diagnosis not present

## 2020-01-04 DIAGNOSIS — C3431 Malignant neoplasm of lower lobe, right bronchus or lung: Secondary | ICD-10-CM | POA: Diagnosis not present

## 2020-01-04 DIAGNOSIS — Z5189 Encounter for other specified aftercare: Secondary | ICD-10-CM | POA: Diagnosis not present

## 2020-01-04 DIAGNOSIS — C3491 Malignant neoplasm of unspecified part of right bronchus or lung: Secondary | ICD-10-CM

## 2020-01-04 MED ORDER — PEGFILGRASTIM-CBQV 6 MG/0.6ML ~~LOC~~ SOSY
PREFILLED_SYRINGE | SUBCUTANEOUS | Status: AC
  Filled 2020-01-04: qty 0.6

## 2020-01-04 MED ORDER — PEGFILGRASTIM-CBQV 6 MG/0.6ML ~~LOC~~ SOSY
6.0000 mg | PREFILLED_SYRINGE | Freq: Once | SUBCUTANEOUS | Status: AC
  Administered 2020-01-04: 6 mg via SUBCUTANEOUS

## 2020-01-04 NOTE — Patient Instructions (Signed)

## 2020-01-05 DIAGNOSIS — Z9181 History of falling: Secondary | ICD-10-CM | POA: Diagnosis not present

## 2020-01-05 DIAGNOSIS — Z87891 Personal history of nicotine dependence: Secondary | ICD-10-CM | POA: Diagnosis not present

## 2020-01-05 DIAGNOSIS — L02612 Cutaneous abscess of left foot: Secondary | ICD-10-CM | POA: Diagnosis not present

## 2020-01-05 DIAGNOSIS — S2232XD Fracture of one rib, left side, subsequent encounter for fracture with routine healing: Secondary | ICD-10-CM | POA: Diagnosis not present

## 2020-01-05 DIAGNOSIS — L97429 Non-pressure chronic ulcer of left heel and midfoot with unspecified severity: Secondary | ICD-10-CM | POA: Diagnosis not present

## 2020-01-05 DIAGNOSIS — W010XXD Fall on same level from slipping, tripping and stumbling without subsequent striking against object, subsequent encounter: Secondary | ICD-10-CM | POA: Diagnosis not present

## 2020-01-05 DIAGNOSIS — G4733 Obstructive sleep apnea (adult) (pediatric): Secondary | ICD-10-CM | POA: Diagnosis not present

## 2020-01-05 DIAGNOSIS — J45909 Unspecified asthma, uncomplicated: Secondary | ICD-10-CM | POA: Diagnosis not present

## 2020-01-05 DIAGNOSIS — E041 Nontoxic single thyroid nodule: Secondary | ICD-10-CM | POA: Diagnosis not present

## 2020-01-05 DIAGNOSIS — E1142 Type 2 diabetes mellitus with diabetic polyneuropathy: Secondary | ICD-10-CM | POA: Diagnosis not present

## 2020-01-05 DIAGNOSIS — N1831 Chronic kidney disease, stage 3a: Secondary | ICD-10-CM | POA: Diagnosis not present

## 2020-01-05 DIAGNOSIS — C3491 Malignant neoplasm of unspecified part of right bronchus or lung: Secondary | ICD-10-CM | POA: Diagnosis not present

## 2020-01-05 DIAGNOSIS — Z794 Long term (current) use of insulin: Secondary | ICD-10-CM | POA: Diagnosis not present

## 2020-01-05 DIAGNOSIS — E11621 Type 2 diabetes mellitus with foot ulcer: Secondary | ICD-10-CM | POA: Diagnosis not present

## 2020-01-05 DIAGNOSIS — R000 Tachycardia, unspecified: Secondary | ICD-10-CM | POA: Diagnosis not present

## 2020-01-05 DIAGNOSIS — I129 Hypertensive chronic kidney disease with stage 1 through stage 4 chronic kidney disease, or unspecified chronic kidney disease: Secondary | ICD-10-CM | POA: Diagnosis not present

## 2020-01-05 DIAGNOSIS — E1165 Type 2 diabetes mellitus with hyperglycemia: Secondary | ICD-10-CM | POA: Diagnosis not present

## 2020-01-07 ENCOUNTER — Inpatient Hospital Stay: Payer: Medicare Other

## 2020-01-07 ENCOUNTER — Other Ambulatory Visit: Payer: Self-pay

## 2020-01-07 ENCOUNTER — Encounter: Payer: Medicare Other | Admitting: Physician Assistant

## 2020-01-07 DIAGNOSIS — S61511A Laceration without foreign body of right wrist, initial encounter: Secondary | ICD-10-CM | POA: Diagnosis not present

## 2020-01-07 DIAGNOSIS — C3491 Malignant neoplasm of unspecified part of right bronchus or lung: Secondary | ICD-10-CM

## 2020-01-07 DIAGNOSIS — Z5189 Encounter for other specified aftercare: Secondary | ICD-10-CM | POA: Diagnosis not present

## 2020-01-07 DIAGNOSIS — C3431 Malignant neoplasm of lower lobe, right bronchus or lung: Secondary | ICD-10-CM | POA: Diagnosis not present

## 2020-01-07 DIAGNOSIS — Z5111 Encounter for antineoplastic chemotherapy: Secondary | ICD-10-CM | POA: Diagnosis not present

## 2020-01-07 DIAGNOSIS — E1142 Type 2 diabetes mellitus with diabetic polyneuropathy: Secondary | ICD-10-CM | POA: Diagnosis not present

## 2020-01-07 DIAGNOSIS — C3490 Malignant neoplasm of unspecified part of unspecified bronchus or lung: Secondary | ICD-10-CM | POA: Diagnosis not present

## 2020-01-07 DIAGNOSIS — E11621 Type 2 diabetes mellitus with foot ulcer: Secondary | ICD-10-CM | POA: Diagnosis not present

## 2020-01-07 DIAGNOSIS — L97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scle: Secondary | ICD-10-CM | POA: Diagnosis not present

## 2020-01-07 DIAGNOSIS — M21372 Foot drop, left foot: Secondary | ICD-10-CM | POA: Diagnosis not present

## 2020-01-07 LAB — CMP (CANCER CENTER ONLY)
ALT: 8 U/L (ref 0–44)
AST: 9 U/L — ABNORMAL LOW (ref 15–41)
Albumin: 2.7 g/dL — ABNORMAL LOW (ref 3.5–5.0)
Alkaline Phosphatase: 123 U/L (ref 38–126)
Anion gap: 4 — ABNORMAL LOW (ref 5–15)
BUN: 24 mg/dL — ABNORMAL HIGH (ref 8–23)
CO2: 29 mmol/L (ref 22–32)
Calcium: 8.5 mg/dL — ABNORMAL LOW (ref 8.9–10.3)
Chloride: 98 mmol/L (ref 98–111)
Creatinine: 1.37 mg/dL — ABNORMAL HIGH (ref 0.61–1.24)
GFR, Est AFR Am: 58 mL/min — ABNORMAL LOW (ref >60)
GFR, Estimated: 50 mL/min — ABNORMAL LOW (ref >60)
Glucose, Bld: 310 mg/dL — ABNORMAL HIGH (ref 70–99)
Potassium: 5 mmol/L (ref 3.5–5.1)
Sodium: 131 mmol/L — ABNORMAL LOW (ref 135–145)
Total Bilirubin: 0.8 mg/dL (ref 0.3–1.2)
Total Protein: 6.2 g/dL — ABNORMAL LOW (ref 6.5–8.1)

## 2020-01-07 LAB — CBC WITH DIFFERENTIAL (CANCER CENTER ONLY)
Abs Immature Granulocytes: 0 10*3/uL (ref 0.00–0.07)
Basophils Absolute: 0 10*3/uL (ref 0.0–0.1)
Basophils Relative: 0 %
Eosinophils Absolute: 0 10*3/uL (ref 0.0–0.5)
Eosinophils Relative: 0 %
HCT: 24.8 % — ABNORMAL LOW (ref 39.0–52.0)
Hemoglobin: 8.4 g/dL — ABNORMAL LOW (ref 13.0–17.0)
Lymphocytes Relative: 2 %
Lymphs Abs: 0.2 10*3/uL — ABNORMAL LOW (ref 0.7–4.0)
MCH: 29.2 pg (ref 26.0–34.0)
MCHC: 33.9 g/dL (ref 30.0–36.0)
MCV: 86.1 fL (ref 80.0–100.0)
Monocytes Absolute: 0.5 10*3/uL (ref 0.1–1.0)
Monocytes Relative: 5 %
Neutro Abs: 8.8 10*3/uL — ABNORMAL HIGH (ref 1.7–7.7)
Neutrophils Relative %: 93 %
Platelet Count: 114 10*3/uL — ABNORMAL LOW (ref 150–400)
RBC: 2.88 MIL/uL — ABNORMAL LOW (ref 4.22–5.81)
RDW: 15.9 % — ABNORMAL HIGH (ref 11.5–15.5)
WBC Count: 9.5 10*3/uL (ref 4.0–10.5)
nRBC: 0 % (ref 0.0–0.2)

## 2020-01-07 MED ORDER — HEPARIN SOD (PORK) LOCK FLUSH 100 UNIT/ML IV SOLN
500.0000 [IU] | Freq: Once | INTRAVENOUS | Status: AC | PRN
  Administered 2020-01-07: 500 [IU]
  Filled 2020-01-07: qty 5

## 2020-01-07 MED ORDER — SODIUM CHLORIDE 0.9% FLUSH
10.0000 mL | INTRAVENOUS | Status: DC | PRN
  Administered 2020-01-07: 10 mL
  Filled 2020-01-07: qty 10

## 2020-01-07 NOTE — Progress Notes (Addendum)
[name], [name] ([id number]) Visit Report for 01/07/2020 Chief Complaint Document Details Patient Name: [name], [name] Date of Service: 01/07/2020 11:00 AM Medical Record Number: [id number] Patient Account Number: [id number] Date of Birth/Sex: [birth year] (74 y.o. M) Treating RN: Grover Canavan Primary Care Provider: [name] Other Clinician: Referring Provider: [name] Treating Provider/Extender: Melburn Hake, Chesnie Capell Weeks in Treatment: 5 Information Obtained from: Patient Chief Complaint Left plantar foot ulcer s/p surgical debridement and wound VAC placement. Left great toe ulcer. Electronic Signature(s) Signed: 01/07/2020 3:08:45 PM By: Worthy Keeler [name] Entered By: Worthy Keeler on 01/07/2020 15:08:45 [name] ([id number]) -------------------------------------------------------------------------------- HPI Details Patient Name: [name] Date of Service: 01/07/2020 11:00 AM Medical Record Number: [id number] Patient Account Number: [id number] Date of Birth/Sex: 1945/09/04 (74 y.o. M) Treating RN: Grover Canavan Primary Care Provider: [name] Other Clinician: Referring Provider: [name] Treating Provider/Extender: Melburn Hake, Anyelina Claycomb Weeks in Treatment: 5 History of Present Illness HPI Description: 10/04/16 on evaluation today patient presents for initial evaluation concerning a laceration of the right wrist and hand on the bowler aspect. He tells me that he fell into glass following a medication change in regard to his diabetes. He initially went to [facility] ER where she was sutured and subsequently his daughter who is a Marine scientist removed the teachers at the appropriate time. Unfortunately the wound has done poorly and dehissed which has caused him some trouble including infection. He has been on antibiotics both topically and orally though the wound continues to show signs of necrotic tissue according to notes which is what he was  sent to [place] for evaluation concerning. He does have diabetes and unfortunately this is severely uncontrolled. He does see an endocrinologist and is working on this. 10/11/2016 -- the patient's notes from the ER were reviewed and he had his suturing done on 09/17/2016 and had necrotic skin flaps which had to be trimmed the last visit he was here. He has managed to get his Santyl ointment and has been using this appropriately. 10/21/16 patient's wound appears to be doing very well on evaluation today. He still has some Slough covering the wound bed but this is nowhere near as severe as when i saw this initially two weeks ago nor even my review of his pictures last week. I'm pleased with how this is progressing. Patient's wound over the right form appears to be doing fairly well on evaluation today. There is no evidence of infection and he has a small area at the crease of where she is wrist extends and flexes that is still open and appears to be having difficulty due to the fact that it is cracking open. I think this is something that may be controlled with moisture control that is keeping the wound a little bit more moist. Nonetheless this is a tiny region compared to the initial wound and overall appears to be doing very well. No fevers, chills, nausea, or vomiting noted at this time. 11/25/16 on evaluation today patient appears to be doing well in regard to his right wrist ulcer. In fact this appears to be completely healed he is having no discomfort. He is pleased with how this is progressed. Readmission: 06/24/17 on evaluation today patient appears back in our clinic regarding issues that he has been having with his right ankle on the lateral malleolus as well is in the dorsal surface of his left foot. Both have been going on for several weeks unfortunately. He tells me that initially this was  being managed by Dr. Edilia Bo and I did review the notes from Dr. Edilia Bo in epic. Patient was placed on  amoxicillin initially and then subsequently Keflex following. He has been using triple antibiotic ointment over-the-counter along with a Band-Aid for the wound currently. He did not have any Santyl remaining. His most recent hemoglobin A1c which was just two days ago was 10.3. Fortunately he seems to have excellent blood flow in the bilateral lower extremities with his left ABI being 0.95 in the right ABI 1.01. Patient does have some pain in regard to each of these ulcerations but he tells me it's not nearly as severe as the hand wound that I help treat earlier over the summer. This was in 2018. 07/15/17 on evaluation today patient's lower surety ulcer is actually both appear to be doing fairly well at this point. He does have a little bit of skin overlapping the ulcer on the right lateral ankle this was debrided away today just with saline and gauze without complication patient had no discomfort or pain. Otherwise patient's left foot ulcer does appear to be showing signs of improvement he does seem to have a right great toe. Nikki and noted at this point in time. I think that this does need to be managed at this point. No fevers, chills, nausea, or vomiting noted at this time. 07/22/17 on evaluation today patient's wounds in regard to his lower extremities appear to be doing somewhat better. His right ankle wound in fact appears to likely be completely closed. The left dorsal foot ulcer though still open does appear to be a little bit smaller and does also seem to be making good progress. He has not been having problems with the treatment at this point in the general he does not seem to show as much erythema surrounding the wound bed which is good news. He is not having significant discomfort. 08/12/17 on evaluation today patients wounds of both appeared to be doing better in fact they both appear to be completely healed. Overall I'm very pleased with how things have progressed over the past week even  more than what I expected. Obviously this is good news and he is happy about this. He did have a fall when going to his orthopedic doctor recently fortunately he does not appear to have injured anything severely but nonetheless does have a couple of bumps and bruises nothing significant at this time but he needs to be seen and wound care for. He may have broken his left wrist he is following up with orthopedics in that regard. Readmission: 10/24/17 on evaluation today patient presents for reevaluation after having been discharged Aug 12, 2017. His wound was healed at that point although he states that it has been somewhat discolored since that time. He has not experienced the drainage as far as the wound is concerned but the fact that it was still discolored been greater than two months after the fact made him want to come in for reevaluation. Upon inspection today the patient has no pain although he does have neuropathy. He does have a discolored region of the dorsal surface of the left foot which was the site where the ulcer was that we previously treated. The good news is he does not again have any discomfort although obviously I do believe this may be some scar tissue and just discoloration in that regard. Again he hasn't had any drainage or discharge since I last saw him and at this point I see no evidence  of that either this area shows complete epithelialization. In general I do not feel like there's any significant issue at this point in that regard. 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 He has been in this clinic before in 2018 with a laceration on his right hand and then again in 2019 with an area over the right lateral malleolus. 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 He states that the wound is been there for the last  week or KASHTON, MCARTOR. ([id number]) 2. It is been uncomfortable to walk on. The man is an active man having to look after an elderly mother. Past medical history, type 2 diabetes with polyneuropathy with a recent hemoglobin A1c in September 2011 0.3, lumbar disc disease, hypergonadism., Left foot drop and gout. We did not repeat his ABIs in the clinic quoting values from July 2019 of 0.95 and 1.01. He is not felt to have significant PAD 02/19/2019 on evaluation today patient actually appears to be doing better with regard to his foot wound based on what I am seeing currently. He was seen by Dr. Dellia Nims on readmission back into the clinic last week when I was off. The good news is the patient has been tolerating the dressing changes without any complication in fact has not really had much in the way of drainage. He does have an AFO brace which she believes is actually pushing on this area as well and has contributed to the issue. Fortunately there is no signs of active infection at this point. His ABIs appear to be well. 03/05/2019 on evaluation today patient appears to be doing well in regard to his foot ulcer. He has been tolerating the dressing changes without complication. Fortunately there is no signs of active infection. The wound is measuring somewhat smaller and overall I feel like he is making good progress at this point. 03/12/2019 on evaluation today patient actually appears to be doing quite well with regard to his foot ulcer. The wound is looking better the measurement is not significantly better at this point unfortunately but nonetheless again the overall appearance is improving I think he is making some progress. I still think the big issue here is foot drop and again the patient wants to see if potentially he could get a brace to help with this. Subsequently I think that we can definitely make a referral to [facility] to see if they can help [place] in this regard. He is in agreement with  that plan. 03/26/2019 on evaluation today patient appears to be doing more poorly in regard to his foot ulcer at this time. He did see podiatry in order to see about getting a brace but unfortunately he tells me that Medicare is not likely to pay for one for him as he had 1 I guess 8 months ago. However this one that apparently goes in his shoes that does not seem to be working out well for him. Unfortunately there are signs of active infection at this time. No fevers, chills, nausea, vomiting, or diarrhea. Systemically. The patient has erythema on the dorsal surface of his foot that tracks from the lateral portion of his foot and again he does have a blister around the wound as well which is worse compared to previous. Overall I am concerned. 04/02/2019 on evaluation today patient appears to be  doing about the same with regard to his wound. Unfortunately there is no signs of significant improvement although I do think there is no signs of infection which is good news. His drainage is also slowed down tremendously. I think at this point he would benefit from the initiation of a total contact cast. The only concern I have is that over his balance. I explained to the patient that if we were to initiate the cast I think it would be greatly beneficial for him but he would need to ensure not to walk at all unless he uses a walker he actually has 2 walkers one for inside his home and one that he keeps out in the car. For that reason he would be covered in that regard. I just do not want him to have any accidents and fall with the cast. 12/28-Patient comes in 1 week out a day early for his appointment he was in the hospital for 2 days for uncontrolled hyperglycemia and required management of high blood sugars, with some adjustments in his home regimen of insulin, patient stubbed his right great toe today and put a Band-Aid on it, he is here for his left lateral foot ulcer and we are using silver alginate  dressing, he was intact total contact cast with difficulty with balance to the extent that that had to be discontinued 04/16/2019 on evaluation today patient appears to be doing better with regard to the overall appearance of his wound size. This is good news. We had attempted a total contact cast but unfortunately he had some falling issues that the family contacted [place] about we took the cast off we did not put it back on. Nonetheless it ended up that just a couple days later he was in the hospital due to elevations in his blood sugar which went to around 500 and even a little higher. Subsequently he was admitted to the hospital and was in the hospital for a total of 3 days and that included Christmas day. Fortunately there got his blood sugar under control he tells me that this is running about 200 now which is much better news. There does not appear to be any signs of active infection at this time. No fevers, chills, nausea, vomiting, or diarrhea. 04/30/2019 on evaluation today patient actually appears to be doing slightly worse compared to previous as far as the overall size of his wound is concerned. There also is some need for sharp debridement today as well. Fortunately there is no evidence of active infection which is good news. No fevers, chills, nausea, vomiting, or diarrhea. 05/07/2019 upon evaluation today patient appears to be doing excellent in regard to his plantar foot ulcer. He has been tolerating the dressing changes without complication. Fortunately there is no signs of active infection at this time. No fevers, chills, nausea, vomiting, or diarrhea. He does tell me that he actually got in touch with [employer] and they feel like they can add diagnosis codes to his orders in order to get an external brace for him as opposed to the one that goes internal to issue. That would be excellent for his foot drop and I did tell him that if there is anything I need to fill out in that regard I will be  more than happy to oblige and fill out what is needed for [employer]. 05/14/2019 upon evaluation today patient's original wound site actually appears to be doing much better which is good news. Fortunately there is no signs of  active infection at this time. No fevers, chills, nausea, vomiting, or diarrhea. With that being said the patient unfortunately is continuing to have issues with rubbing he tells me that the felt slid on his foot although he did not realize it until today. Nonetheless I think this did cause a blister around the edge of the wound and he does an open wound that is slightly larger overall in measurement compared to last time mainly due to this blister. Other than that I feel like he is doing quite well. 05/21/2019 upon evaluation today patient appears to be doing excellent in regard to his wound at this time on the plantar foot. He is showing signs of improvement he does have some slough and biofilm noted on the surface of the wound this is going require some sharp debridement today as well. Fortunately there is no evidence of active infection. No fevers, chills, nausea, vomiting, or diarrhea. 05/28/2019 on evaluation today patient's wound actually appears to be measuring smaller upon initial inspection and I do believe that it is doing well. With that being said he continues to have a blister on the outside/lateral edge of the wound that occurs as a result of rubbing due to his dropfoot currently. Subsequently we have filled out the paperwork for his new external brace but again that does take several weeks to get that completed once everything is returned to back we did send this back last week as far as the paperwork was concerned. With that being said there does not appear to be any signs of active infection at this time. 06/04/2019 on evaluation today patient's wound actually appears to be doing slightly better compared to last week which is good news. He still states that his  insurance unfortunately does not seem to want to cover any of his new brace. He is going to just pay for this himself. Nonetheless he is going by there today when he leaves here that is [employer] in [place] that is going to be making this brace for him. 06/18/2019 upon evaluation today patient fortunately does have his new brace which is the external AFO brace with bilateral uprights. Subsequently he seems to be doing much better in fact the wound already appears better. He has not even been using the felt he is just using a dressing over Eldora ([id number]) top of the wound and overall I am very pleased with how things seem to be progressing. There is no signs of active infection at this time which is great news and I think that the AFO is good to help him tremendously as far as trying to keep pressure off of the 06/25/2019 upon evaluation today patient seems to be making good progress in regard to his wound. He has been tolerating the dressing changes without complication. Fortunately there is no signs of active infection at this time. No fevers, chills, nausea, vomiting, or diarrhea. 07/02/2019 upon evaluation today patient unfortunately had a blistered area on the bottom of his foot tracking more towards the midline of the plantar aspect. He does not seem to have issues with rubbing more laterally that he used to have but nonetheless this is still giving him some trouble and causing problems here. Fortunately there is no signs of active infection at this time. No fevers, chills, nausea, vomiting, or diarrhea. 07/09/2019 upon evaluation today patient appears to be doing well with regard to his plantar foot ulcer. Definitely this is much improved compared to last week's visit which again last  week the issue there was that he had an area of callus buildup on the plantar aspect of his foot I was able to clean this away and after effectively doing so the wound seems to be doing much better which  is great news. There is no signs of active infection at this time. 07/16/2019 upon evaluation today patient appears to be making some progress in my opinion with regard to his plantar foot ulcer. He has been tolerating the dressing changes without complication. Fortunately there is no evidence of active infection at this time which is great news. 07/23/2019 upon evaluation today patient appears to be doing well with regard to his wounds on the plantar foot. He has been tolerating the dressing changes without complication. These do seem to be healing. 08/06/2019 upon evaluation today patient's wound actually appears to be showing some signs of improvement though he still has a significant amount of callus buildup over this location. That is can require some debridement today. With that being said the actual wound openings seem to be dramatically improved which is great news. There is no signs of active infection at this time. No fevers, chills, nausea, vomiting, or diarrhea. 08/13/2019 upon evaluation today patient appears to be doing well with regard to his foot ulcer. He has been tolerating the dressing changes without complication. Fortunately there is no signs of active infection at this time. No fevers, chills, nausea, vomiting, or diarrhea. 08/20/2019 upon evaluation today patient appears to be doing excellent in regard to his foot ulcer. I do believe he still showing signs of improvement this is very slow but again I think still pressure getting to the area even with his brace and custom shoe is still part of the main issue with the delay here. Nonetheless I do not see any signs of worsening or infection all of which is good news. 5/20; left lateral foot wound. He wears an AFO brace for foot drop related to his diabetes. The wound is small and clean looking he is using silver collagen. We have apparently tried him in offloading shoe he could not tolerate this. He does not have good balance. 09/11/2019  upon evaluation today patient appears to be doing more poorly in regard to his foot ulcer as compared to last time I saw him. Unfortunately he does tell me as we discussed on Friday that he had pus and blood coming from the area underneath the callus. Unfortunately I think that this is a combination of the callus not being trimmed away enough and potentially the felt pad causing an abnormal rubbing which has happened before which led to this becoming more irritated and likely infected based on what I am seeing today. The patient takes ampicillin all the time. Subsequently he also states that he has been trying for a couple of days to take the clindamycin of which she had some leftover from previous. With that being said it upset his stomach. The antibiotic that I sent in for him on Friday unfortunately I wrote the wrong number on as far as the number of pills that he needed. With that being said I corrected that this morning but he tells me is not can pick it up as that seems to be making him have trouble with his stomach being upset at this point. Fortunately there is no signs of active infection at this time systemically. 09/17/2019 upon evaluation today patient appears to be doing better in regard to his foot ulcer. Has been taking ampicillin that  he had leftover from a previous prescription. With that being said he did go see his doctor today concerning his diabetes and notes that his hemoglobin A1c was 13 obviously this is elevated and there can be working on that. Also did review the culture result that we finally got back for final well that showed rare Pseudomonas and a few group B strep noted. The Pseudomonas was sensitive to Cipro but again the wound is doing so much better with the ampicillin I think this is probably not a causative organism I do not see anything that really has me concerned as far as infection is concerned today. Therefore I would hold off on prescribing Cipro for the patient  and have him discontinue the ampicillin until completion. 09/24/2019 upon evaluation today patient appears to be doing 09/24/2019 upon evaluation today patient appears to be doing well with regard to his foot ulcer. He actually has not enlarged any and seems to be measuring smaller quite significantly at this point. His toe ulcer is also doing somewhat better which is good news. Fortunately there is no evidence of active infection at this time. No fevers, chills, nausea, vomiting, or diarrhea. 10/08/2019 upon evaluation today patient appears to be doing well with regard to his foot ulcer all things considered. Fortunately there is no signs of active infection at this time. No fevers, chills, nausea, vomiting, or diarrhea. Fortunately he states that overall he has been doing much better with regard to not have any blistering on the side of his foot. Unfortunately he did have a fall last night due to the fact that he got up to walk without his brace on and did not have his walker either it was around 4 in the morning and his mother was calling out. Subsequently he states that the foot drop got him and he ended up following. Nonetheless he states he normally uses his walker I explained he needs to try to make sure not to ever go without the walker. No matter how attempting the situation may be. Readmission: 12/03/2019 upon evaluation today patient presents for follow-up after having had surgery last week on his foot for surgical debridement and then subsequent wound VAC placement. He had gone into the hospital as he was having a significant infection and not feeling well. He is unfortunately since I last saw him also been under the care of the cancer doctors and they have been taking good care of him. He does seem to be in fairly high spirits all things considered. With that being said he has lung cancer he tells me that his back from the radiation feels like it is constantly burned. 12/10/2019 on evaluation  today patient appears to be doing well with regard to his wound. I do believe the wound VAC is doing a great job at helping to manage his drainage and overall health and to granulate in the wound. He has less undermining noted today in fact almost none. Home health is coming out to change the wound VAC on a regular basis Monday, Wednesday, and Friday. 12/24/2019 on evaluation today patient appears to be doing fairly well in regard to the wound on the plantar aspect of his foot. With that being said he does have a significant feeling in of what has been going on with regard to the ulceration which does not have the depth that it did in the past. With that being said I do believe that based on what I am seeing at this point the  patient likely does need to take a break from the wound VAC and in fact it appears the wound VAC was actually placed directly on the skin as far as the bridging was concerned causing some irritation of the top of the patient's foot. This has me concerned as well that if were not careful that could worsen. I do believe a break with the wound VAC however will be fine and then we can see where we go from there. [name], [name] ([id number]) 01/07/2020 upon evaluation today patient actually appears to be doing much better in regard to his wound. He has been tolerating the dressing changes without complication. There is no signs of active infection and overall I feel like he is managing quite nicely. I do believe that we would likely discontinue the [facility] altogether today. Electronic Signature(s) Signed: 01/07/2020 3:14:52 PM By: Worthy Keeler [name] Entered By: Worthy Keeler on 01/07/2020 15:14:51 [name] ([id number]) -------------------------------------------------------------------------------- Physical Exam Details Patient Name: [name] Date of Service: 01/07/2020 11:00 AM Medical Record Number: [id number] Patient Account Number: [id number] Date of Birth/Sex:  11-28-1945 (74 y.o. M) Treating RN: Grover Canavan Primary Care Provider: [name] Other Clinician: Referring Provider: [name] Treating Provider/Extender: [name], Kora Groom Weeks in Treatment: 5 Constitutional Well-nourished and well-hydrated in no acute distress. Respiratory normal breathing without difficulty. Psychiatric this patient is able to make decisions and demonstrates good insight into disease process. Alert and Oriented x 3. pleasant and cooperative. Notes Upon inspection patient's wound bed again showed excellent granulation epithelization there is no signs of active infection overall I feel like he is doing very well with the Hydrofera Blue I do not believe the wound VAC is can be necessary any further. Electronic Signature(s) Signed: 01/07/2020 3:15:18 PM By: Worthy Keeler [name] Entered By: Worthy Keeler on 01/07/2020 15:15:18 [name] ([id number]) -------------------------------------------------------------------------------- Physician Orders Details Patient Name: [name] Date of Service: 01/07/2020 11:00 AM Medical Record Number: [id number] Patient Account Number: [id number] Date of Birth/Sex: 28-Mar-1946 (74 y.o. 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 Anesthetic (add to Medication List) Wound #7 Left,Lateral,Dorsal Foot o Topical Lidocaine 4% cream applied to wound bed prior to debridement  (In Clinic Only). Primary Wound Dressing Wound #7 Left,Lateral,Dorsal Foot o Hydrafera Blue Ready Transfer Secondary Dressing Wound #7 Left,Lateral,Dorsal Foot o Boardered Foam Dressing Dressing Change Frequency Wound #7 Left,Lateral,Dorsal Foot o Change Dressing Monday, Wednesday, Friday Follow-up Appointments Wound #7 Left,Lateral,Dorsal Foot o Return Appointment in 2 weeks. Edema Control Wound #7 Left,Lateral,Dorsal Foot o Elevate legs to the level of the heart and pump ankles as often as possible Off-Loading Wound #7 Left,Lateral,Dorsal Foot o Other: - No weight on foot, Wheelchair only Additional Orders / Instructions Wound #7 Left,Lateral,Dorsal Foot o Increase protein intake. Home Health Wound #7 [place] Visits - Opp Nurse may visit PRN to address patientos wound care needs. [name], [name] ([id number]) o FACE TO FACE ENCOUNTER: MEDICARE and MEDICAID PATIENTS: I certify that this patient is under my  care and that I had a face-to-face encounter that meets the physician face-to-face encounter requirements with this patient on this date. The encounter with the patient was in whole or in part for the following MEDICAL CONDITION: (primary reason for [place]) MEDICAL NECESSITY: I certify, that based on my findings, NURSING services are a medically necessary home health service. HOME BOUND STATUS: I certify that my clinical findings support that this patient is homebound (i.e., Due to illness or injury, pt requires aid of supportive devices such as crutches, cane, wheelchairs, walkers, the use of special transportation or the assistance of another person to leave their place of residence. There is a normal inability to leave the home and doing so requires considerable and taxing effort. Other absences are for medical reasons / religious services and are infrequent or of short duration when for  other reasons). o If current dressing causes regression in wound condition, may D/C ordered dressing product/s and apply Normal Saline Moist Dressing daily until next [place] / Other MD appointment. [place] of regression in wound condition at [phone]. o Please direct any NON-WOUND related issues/requests for orders to patient's Primary Care Physician Negative Pressure Wound Therapy Wound #7 Left,Lateral,Dorsal Foot o Discontinue NPWT. Electronic Signature(s) Signed: 01/07/2020 4:50:05 PM By: Grover Canavan Signed: 01/07/2020 5:06:45 PM By: Worthy Keeler [name] Entered By: Grover Canavan on 01/07/2020 11:43:13 [name] ([id number]) -------------------------------------------------------------------------------- Problem List Details Patient Name: [name] Date of Service: 01/07/2020 11:00 AM Medical Record Number: [id number] Patient Account Number: [id number] Date of Birth/Sex: [birth year] (74 y.o. 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 12/03/2019 No Yes muscle E11.42 Type 2 diabetes mellitus with diabetic polyneuropathy 12/03/2019 No Yes M21.372 Foot drop, left foot 12/03/2019 No Yes Inactive Problems Resolved Problems Electronic Signature(s) Signed: 01/07/2020 3:08:38 PM By: Worthy Keeler [name] Previous Signature: 01/07/2020 11:25:51 AM Version By: Worthy Keeler [name] Entered By: Worthy Keeler on 01/07/2020 15:08:37 [name] ([id number]) -------------------------------------------------------------------------------- Progress Note  Details Patient Name: [name] Date of Service: 01/07/2020 11:00 AM Medical Record Number: [id number] Patient Account Number: [id number] Date of Birth/Sex: 1945/11/23 (74 y.o. M) Treating RN: Grover Canavan Primary Care Provider: [name] Other Clinician: Referring Provider: [name] Treating Provider/Extender: Melburn Hake, Murvin Gift Weeks in Treatment: 5 Subjective Chief Complaint Information obtained from Patient Left plantar foot ulcer s/p surgical debridement and wound VAC placement. Left great toe ulcer. History of Present Illness (HPI) 10/04/16 on evaluation today patient presents for initial evaluation concerning a laceration of the right wrist and hand on the bowler aspect. He tells me that he fell into glass following a medication change in regard to his diabetes. He initially went to [facility] where she was sutured and subsequently his daughter who is a Marine scientist removed the teachers at the appropriate time. Unfortunately the wound has done poorly and dehissed which has caused him some trouble including infection. He has been on antibiotics both topically and orally though the wound continues to show signs of necrotic tissue according to notes which is what he was sent to [place] for evaluation concerning. He does have diabetes and unfortunately this is severely uncontrolled. He does  see an endocrinologist and is working on this. 10/11/2016 -- the patient's notes from the ER were reviewed and he had his suturing done on 09/17/2016 and had necrotic skin flaps which had to be trimmed the last visit he was here. He has managed to get his Santyl ointment and has been using this appropriately. 10/21/16 patient's wound appears to be doing very well on evaluation today. He still has some Slough covering the wound bed but this is nowhere near as severe as when i saw this initially two weeks ago nor even my review of his pictures last week. I'm pleased with how this is  progressing. Patient's wound over the right form appears to be doing fairly well on evaluation today. There is no evidence of infection and he has a small area at the crease of where she is wrist extends and flexes that is still open and appears to be having difficulty due to the fact that it is cracking open. I think this is something that may be controlled with moisture control that is keeping the wound a little bit more moist. Nonetheless this is a tiny region compared to the initial wound and overall appears to be doing very well. No fevers, chills, nausea, or vomiting noted at this time. 11/25/16 on evaluation today patient appears to be doing well in regard to his right wrist ulcer. In fact this appears to be completely healed he is having no discomfort. He is pleased with how this is progressed. Readmission: 06/24/17 on evaluation today patient appears back in our clinic regarding issues that he has been having with his right ankle on the lateral malleolus as well is in the dorsal surface of his left foot. Both have been going on for several weeks unfortunately. He tells me that initially this was being managed by Dr. Edilia Bo and I did review the notes from Dr. Edilia Bo in epic. Patient was placed on amoxicillin initially and then subsequently Keflex following. He has been using triple antibiotic ointment over-the-counter along with a Band-Aid for the wound currently. He did not have any Santyl remaining. His most recent hemoglobin A1c which was just two days ago was 10.3. Fortunately he seems to have excellent blood flow in the bilateral lower extremities with his left ABI being 0.95 in the right ABI 1.01. Patient does have some pain in regard to each of these ulcerations but he tells me it's not nearly as severe as the hand wound that I help treat earlier over the summer. This was in 2018. 07/15/17 on evaluation today patient's lower surety ulcer is actually both appear to be doing fairly well  at this point. He does have a little bit of skin overlapping the ulcer on the right lateral ankle this was debrided away today just with saline and gauze without complication patient had no discomfort or pain. Otherwise patient's left foot ulcer does appear to be showing signs of improvement he does seem to have a right great toe. Nikki and noted at this point in time. I think that this does need to be managed at this point. No fevers, chills, nausea, or vomiting noted at this time. 07/22/17 on evaluation today patient's wounds in regard to his lower extremities appear to be doing somewhat better. His right ankle wound in fact appears to likely be completely closed. The left dorsal foot ulcer though still open does appear to be a little bit smaller and does also seem to be making good progress. He has not been having  problems with the treatment at this point in the general he does not seem to show as much erythema surrounding the wound bed which is good news. He is not having significant discomfort. 08/12/17 on evaluation today patients wounds of both appeared to be doing better in fact they both appear to be completely healed. Overall I'm very pleased with how things have progressed over the past week even more than what I expected. Obviously this is good news and he is happy about this. He did have a fall when going to his orthopedic doctor recently fortunately he does not appear to have injured anything severely but nonetheless does have a couple of bumps and bruises nothing significant at this time but he needs to be seen and wound care for. He may have broken his left wrist he is following up with orthopedics in that regard. Readmission: 10/24/17 on evaluation today patient presents for reevaluation after having been discharged Aug 12, 2017. His wound was healed at that point although he states that it has been somewhat discolored since that time. He has not experienced the drainage as far as the  wound is concerned but the fact that it was still discolored been greater than two months after the fact made him want to come in for reevaluation. Upon inspection today the patient has no pain although he does have neuropathy. He does have a discolored region of the dorsal surface of the left foot which was the site where the ulcer was that we previously treated. The good news is he does not again have any discomfort although obviously I do believe this may be some scar tissue and just discoloration in that regard. Again he hasn't had any drainage or discharge since I last saw him and at this point I see no evidence of that either this area shows complete epithelialization. In general I do not feel like there's any significant issue at this point in that regard. ASIR, BINGLEY ([id number]) 02/09/2019 This is a 74 year old man with type 2 diabetes poorly controlled with significant peripheral neuropathy. He has been in this clinic before in 2018 with a laceration on his right hand and then again in 2019 with an area over the right lateral malleolus. 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 He states that the wound is been there for the last week or 2. It is been uncomfortable to walk on. The man is an active man having to look after an elderly mother. Past medical history, type 2 diabetes with polyneuropathy with a recent hemoglobin A1c in September 2011 0.3, lumbar disc disease, hypergonadism., Left foot drop and gout. We did not repeat his ABIs in the clinic quoting values from July 2019 of 0.95 and 1.01. He is not felt to have significant PAD 02/19/2019 on evaluation today patient actually appears to be doing better with regard to his foot wound based on what I am seeing currently. He was seen by Dr. Dellia Nims on readmission back into the clinic last week when I was off. The good news is the patient has  been tolerating the dressing changes without any complication in fact has not really had much in the way of drainage. He does have an AFO brace which she believes is actually pushing on this area as well and has contributed to the issue. Fortunately there is no signs of active infection at this point. His  ABIs appear to be well. 03/05/2019 on evaluation today patient appears to be doing well in regard to his foot ulcer. He has been tolerating the dressing changes without complication. Fortunately there is no signs of active infection. The wound is measuring somewhat smaller and overall I feel like he is making good progress at this point. 03/12/2019 on evaluation today patient actually appears to be doing quite well with regard to his foot ulcer. The wound is looking better the measurement is not significantly better at this point unfortunately but nonetheless again the overall appearance is improving I think he is making some progress. I still think the big issue here is foot drop and again the patient wants to see if potentially he could get a brace to help with this. Subsequently I think that we can definitely make a referral to [facility] to see if they can help [place] in this regard. He is in agreement with that plan. 03/26/2019 on evaluation today patient appears to be doing more poorly in regard to his foot ulcer at this time. He did see podiatry in order to see about getting a brace but unfortunately he tells me that Medicare is not likely to pay for one for him as he had 1 I guess 8 months ago. However this one that apparently goes in his shoes that does not seem to be working out well for him. Unfortunately there are signs of active infection at this time. No fevers, chills, nausea, vomiting, or diarrhea. Systemically. The patient has erythema on the dorsal surface of his foot that tracks from the lateral portion of his foot and again he does have a blister around the wound as well which is  worse compared to previous. Overall I am concerned. 04/02/2019 on evaluation today patient appears to be doing about the same with regard to his wound. Unfortunately there is no signs of significant improvement although I do think there is no signs of infection which is good news. His drainage is also slowed down tremendously. I think at this point he would benefit from the initiation of a total contact cast. The only concern I have is that over his balance. I explained to the patient that if we were to initiate the cast I think it would be greatly beneficial for him but he would need to ensure not to walk at all unless he uses a walker he actually has 2 walkers one for inside his home and one that he keeps out in the car. For that reason he would be covered in that regard. I just do not want him to have any accidents and fall with the cast. 12/28-Patient comes in 1 week out a day early for his appointment he was in the hospital for 2 days for uncontrolled hyperglycemia and required management of high blood sugars, with some adjustments in his home regimen of insulin, patient stubbed his right great toe today and put a Band-Aid on it, he is here for his left lateral foot ulcer and we are using silver alginate dressing, he was intact total contact cast with difficulty with balance to the extent that that had to be discontinued 04/16/2019 on evaluation today patient appears to be doing better with regard to the overall appearance of his wound size. This is good news. We had attempted a total contact cast but unfortunately he had some falling issues that the family contacted [place] about we took the cast off we did not put it back on. Nonetheless it  ended up that just a couple days later he was in the hospital due to elevations in his blood sugar which went to around 500 and even a little higher. Subsequently he was admitted to the hospital and was in the hospital for a total of 3 days and that  included Christmas day. Fortunately there got his blood sugar under control he tells me that this is running about 200 now which is much better news. There does not appear to be any signs of active infection at this time. No fevers, chills, nausea, vomiting, or diarrhea. 04/30/2019 on evaluation today patient actually appears to be doing slightly worse compared to previous as far as the overall size of his wound is concerned. There also is some need for sharp debridement today as well. Fortunately there is no evidence of active infection which is good news. No fevers, chills, nausea, vomiting, or diarrhea. 05/07/2019 upon evaluation today patient appears to be doing excellent in regard to his plantar foot ulcer. He has been tolerating the dressing changes without complication. Fortunately there is no signs of active infection at this time. No fevers, chills, nausea, vomiting, or diarrhea. He does tell me that he actually got in touch with [employer] and they feel like they can add diagnosis codes to his orders in order to get an external brace for him as opposed to the one that goes internal to issue. That would be excellent for his foot drop and I did tell him that if there is anything I need to fill out in that regard I will be more than happy to oblige and fill out what is needed for [employer]. 05/14/2019 upon evaluation today patient's original wound site actually appears to be doing much better which is good news. Fortunately there is no signs of active infection at this time. No fevers, chills, nausea, vomiting, or diarrhea. With that being said the patient unfortunately is continuing to have issues with rubbing he tells me that the felt slid on his foot although he did not realize it until today. Nonetheless I think this did cause a blister around the edge of the wound and he does an open wound that is slightly larger overall in measurement compared to last time mainly due to this blister. Other than  that I feel like he is doing quite well. 05/21/2019 upon evaluation today patient appears to be doing excellent in regard to his wound at this time on the plantar foot. He is showing signs of improvement he does have some slough and biofilm noted on the surface of the wound this is going require some sharp debridement today as well. Fortunately there is no evidence of active infection. No fevers, chills, nausea, vomiting, or diarrhea. 05/28/2019 on evaluation today patient's wound actually appears to be measuring smaller upon initial inspection and I do believe that it is doing well. With that being said he continues to have a blister on the outside/lateral edge of the wound that occurs as a result of rubbing due to his dropfoot currently. Subsequently we have filled out the paperwork for his new external brace but again that does take several weeks to get that completed once everything is returned to back we did send this back last week as far as the paperwork was concerned. With that being said there does not appear to be any signs of active infection at this time. 06/04/2019 on evaluation today patient's wound actually appears to be doing slightly better compared to last week which is  good news. He still [name], [name] ([id number]) states that his insurance unfortunately does not seem to want to cover any of his new brace. He is going to just pay for this himself. Nonetheless he is going by there today when he leaves here that is [employer] in [place] that is going to be making this brace for him. 06/18/2019 upon evaluation today patient fortunately does have his new brace which is the external AFO brace with bilateral uprights. Subsequently he seems to be doing much better in fact the wound already appears better. He has not even been using the felt he is just using a dressing over top of the wound and overall I am very pleased with how things seem to be progressing. There is no signs of active  infection at this time which is great news and I think that the AFO is good to help him tremendously as far as trying to keep pressure off of the 06/25/2019 upon evaluation today patient seems to be making good progress in regard to his wound. He has been tolerating the dressing changes without complication. Fortunately there is no signs of active infection at this time. No fevers, chills, nausea, vomiting, or diarrhea. 07/02/2019 upon evaluation today patient unfortunately had a blistered area on the bottom of his foot tracking more towards the midline of the plantar aspect. He does not seem to have issues with rubbing more laterally that he used to have but nonetheless this is still giving him some trouble and causing problems here. Fortunately there is no signs of active infection at this time. No fevers, chills, nausea, vomiting, or diarrhea. 07/09/2019 upon evaluation today patient appears to be doing well with regard to his plantar foot ulcer. Definitely this is much improved compared to last week's visit which again last week the issue there was that he had an area of callus buildup on the plantar aspect of his foot I was able to clean this away and after effectively doing so the wound seems to be doing much better which is great news. There is no signs of active infection at this time. 07/16/2019 upon evaluation today patient appears to be making some progress in my opinion with regard to his plantar foot ulcer. He has been tolerating the dressing changes without complication. Fortunately there is no evidence of active infection at this time which is great news. 07/23/2019 upon evaluation today patient appears to be doing well with regard to his wounds on the plantar foot. He has been tolerating the dressing changes without complication. These do seem to be healing. 08/06/2019 upon evaluation today patient's wound actually appears to be showing some signs of improvement though he still has a  significant amount of callus buildup over this location. That is can require some debridement today. With that being said the actual wound openings seem to be dramatically improved which is great news. There is no signs of active infection at this time. No fevers, chills, nausea, vomiting, or diarrhea. 08/13/2019 upon evaluation today patient appears to be doing well with regard to his foot ulcer. He has been tolerating the dressing changes without complication. Fortunately there is no signs of active infection at this time. No fevers, chills, nausea, vomiting, or diarrhea. 08/20/2019 upon evaluation today patient appears to be doing excellent in regard to his foot ulcer. I do believe he still showing signs of improvement this is very slow but again I think still pressure getting to the area even with his brace and custom  shoe is still part of the main issue with the delay here. Nonetheless I do not see any signs of worsening or infection all of which is good news. 5/20; left lateral foot wound. He wears an AFO brace for foot drop related to his diabetes. The wound is small and clean looking he is using silver collagen. We have apparently tried him in offloading shoe he could not tolerate this. He does not have good balance. 09/11/2019 upon evaluation today patient appears to be doing more poorly in regard to his foot ulcer as compared to last time I saw him. Unfortunately he does tell me as we discussed on Friday that he had pus and blood coming from the area underneath the callus. Unfortunately I think that this is a combination of the callus not being trimmed away enough and potentially the felt pad causing an abnormal rubbing which has happened before which led to this becoming more irritated and likely infected based on what I am seeing today. The patient takes ampicillin all the time. Subsequently he also states that he has been trying for a couple of days to take the clindamycin of which she had some  leftover from previous. With that being said it upset his stomach. The antibiotic that I sent in for him on Friday unfortunately I wrote the wrong number on as far as the number of pills that he needed. With that being said I corrected that this morning but he tells me is not can pick it up as that seems to be making him have trouble with his stomach being upset at this point. Fortunately there is no signs of active infection at this time systemically. 09/17/2019 upon evaluation today patient appears to be doing better in regard to his foot ulcer. Has been taking ampicillin that he had leftover from a previous prescription. With that being said he did go see his doctor today concerning his diabetes and notes that his hemoglobin A1c was 13 obviously this is elevated and there can be working on that. Also did review the culture result that we finally got back for final well that showed rare Pseudomonas and a few group B strep noted. The Pseudomonas was sensitive to Cipro but again the wound is doing so much better with the ampicillin I think this is probably not a causative organism I do not see anything that really has me concerned as far as infection is concerned today. Therefore I would hold off on prescribing Cipro for the patient and have him discontinue the ampicillin until completion. 09/24/2019 upon evaluation today patient appears to be doing 09/24/2019 upon evaluation today patient appears to be doing well with regard to his foot ulcer. He actually has not enlarged any and seems to be measuring smaller quite significantly at this point. His toe ulcer is also doing somewhat better which is good news. Fortunately there is no evidence of active infection at this time. No fevers, chills, nausea, vomiting, or diarrhea. 10/08/2019 upon evaluation today patient appears to be doing well with regard to his foot ulcer all things considered. Fortunately there is no signs of active infection at this time. No  fevers, chills, nausea, vomiting, or diarrhea. Fortunately he states that overall he has been doing much better with regard to not have any blistering on the side of his foot. Unfortunately he did have a fall last night due to the fact that he got up to walk without his brace on and did not have his walker either  it was around 4 in the morning and his mother was calling out. Subsequently he states that the foot drop got him and he ended up following. Nonetheless he states he normally uses his walker I explained he needs to try to make sure not to ever go without the walker. No matter how attempting the situation may be. Readmission: 12/03/2019 upon evaluation today patient presents for follow-up after having had surgery last week on his foot for surgical debridement and then subsequent wound VAC placement. He had gone into the hospital as he was having a significant infection and not feeling well. He is unfortunately since I last saw him also been under the care of the cancer doctors and they have been taking good care of him. He does seem to be in fairly high spirits all things considered. With that being said he has lung cancer he tells me that his back from the radiation feels like it is constantly burned. 12/10/2019 on evaluation today patient appears to be doing well with regard to his wound. I do believe the wound VAC is doing a great job at helping to manage his drainage and overall health and to granulate in the wound. He has less undermining noted today in fact almost none. Home health is coming out to change the wound VAC on a regular basis Monday, Wednesday, and Friday. 12/24/2019 on evaluation today patient appears to be doing fairly well in regard to the wound on the plantar aspect of his foot. With that being said he does have a significant feeling in of what has been going on with regard to the ulceration which does not have the depth that it did in the [name], [name] ([id number]) past.  With that being said I do believe that based on what I am seeing at this point the patient likely does need to take a break from the wound VAC and in fact it appears the wound VAC was actually placed directly on the skin as far as the bridging was concerned causing some irritation of the top of the patient's foot. This has me concerned as well that if were not careful that could worsen. I do believe a break with the wound VAC however will be fine and then we can see where we go from there. 01/07/2020 upon evaluation today patient actually appears to be doing much better in regard to his wound. He has been tolerating the dressing changes without complication. There is no signs of active infection and overall I feel like he is managing quite nicely. I do believe that we would likely discontinue the [facility] altogether today. Objective Constitutional Well-nourished and well-hydrated in no acute distress. Vitals Time Taken: 10:50 AM, Height: 71 in, Weight: 190 lbs, BMI: 26.5, Temperature: 98.3 F, Pulse: 118 bpm, Respiratory Rate: 16 breaths/min, Blood Pressure: 172/80 mmHg. Respiratory normal breathing without difficulty. Psychiatric this patient is able to make decisions and demonstrates good insight into disease process. Alert and Oriented x 3. pleasant and cooperative. General Notes: Upon inspection patient's wound bed again showed excellent granulation epithelization there is no signs of active infection overall I feel like he is doing very well with the Hydrofera Blue I do not believe the wound VAC is can be necessary any further. Integumentary (Hair, Skin) Wound #7 status is Open. Original cause of wound was Surgical Injury. The wound is located on the [place]. The wound measures 2.2cm length x 3cm width x 0.4cm depth; 5.184cm^2 area and 2.073cm^3 volume. There is Fat  Layer (Subcutaneous Tissue) exposed. There is a medium amount of serosanguineous drainage noted. The wound margin  is flat and intact. There is small (1-33%) pink granulation within the wound bed. There is a large (67-100%) amount of necrotic tissue within the wound bed including Adherent Slough. Wound #8 status is Healed - Epithelialized. Original cause of wound was Pressure Injury. The wound is located on the [employer]. The wound measures 0cm length x 0cm width x 0cm depth; 0cm^2 area and 0cm^3 volume. 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 Anesthetic (add to Medication List): Wound #7 Left,Lateral,Dorsal Foot: Topical Lidocaine 4% cream applied to wound bed prior to debridement (In Clinic Only). Primary Wound Dressing: Wound #7 Left,Lateral,Dorsal Foot: Hydrafera Blue Ready Transfer Secondary Dressing: [name], [name] ([id number]) Wound #7 Left,Lateral,Dorsal Foot: Boardered Foam Dressing Dressing Change Frequency: Wound #7 Left,Lateral,Dorsal Foot: Change Dressing Monday, Wednesday, Friday Follow-up Appointments: Wound #7 Left,Lateral,Dorsal Foot: Return Appointment in 2 weeks. Edema Control: Wound #7 Left,Lateral,Dorsal Foot: Elevate legs to the level of the heart and pump ankles as often as possible Off-Loading: Wound #7 Left,Lateral,Dorsal Foot: Other: - No weight on foot, Wheelchair only Additional Orders / Instructions: Wound #7 Left,Lateral,Dorsal Foot: Increase protein intake. Home Health: Wound #7 Left,Lateral,Dorsal Foot: Continue Home Health Visits - [place] Nurse may visit PRN to address patient s wound care needs. FACE TO FACE ENCOUNTER: MEDICARE and MEDICAID PATIENTS: I certify that this patient is under my care and that I had a face-to-face encounter that meets the  physician face-to-face encounter requirements with this patient on this date. The encounter with the patient was in whole or in part for the following MEDICAL CONDITION: (primary reason for [place]) MEDICAL NECESSITY: I certify, that based on my findings, NURSING services are a medically necessary home health service. HOME BOUND STATUS: I certify that my clinical findings support that this patient is homebound (i.e., Due to illness or injury, pt requires aid of supportive devices such as crutches, cane, wheelchairs, walkers, the use of special transportation or the assistance of another person to leave their place of residence. There is a normal inability to leave the home and doing so requires considerable and taxing effort. Other absences are for medical reasons / religious services and are infrequent or of short duration when for other reasons). If current dressing causes regression in wound condition, may D/C ordered dressing product/s and apply Normal Saline Moist Dressing daily until next [place] / Other MD appointment. [place] of regression in wound condition at [phone]. Please direct any NON-WOUND related issues/requests for orders to patient's Primary Care Physician Negative Pressure Wound Therapy: Wound #7 Left,Lateral,Dorsal Foot: Discontinue NPWT. 1. We will discontinue the wound VAC at this time the patient's toe ulcer did close out today and appears to be very well-healed without any complication which is great news. The plantar foot ulcer again is making progress albeit slow I think keeping off of it and staying in the wheelchair has been of utmost benefit for him. 2. I am also can recommend at this time that we continue to monitor for any signs of infection or worsening in general. With that being said at the moment I do not see anything that looks infected and overall I  think he is doing quite well. 3. I also recommend the patient  continue to use the wheelchair for ambulation. I think he needs to stay off of this is much as possible and this is really the one and only way to accomplish that for him as his balance is not good enough for a cast. We will see patient back for reevaluation in 2 weeks here in the clinic. If anything worsens or changes patient will contact our office for additional recommendations. Electronic Signature(s) Signed: 01/07/2020 3:21:41 PM By: Worthy Keeler [name] Entered By: Worthy Keeler on 01/07/2020 15:21:40 [name] ([id number]) -------------------------------------------------------------------------------- SuperBill Details Patient Name: [name] Date of Service: 01/07/2020 Medical Record Number: [id number] Patient Account Number: [id number] Date of Birth/Sex: [birth year] (74 y.o. M) Treating RN: Grover Canavan Primary Care Provider: [name] Other Clinician: Referring Provider: [name] Treating Provider/Extender: Melburn Hake, Tobi Leinweber Weeks in Treatment: 5 Diagnosis Coding ICD-10 Codes Code Description E11.621 Type 2 diabetes mellitus with foot ulcer L97.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scle E11.42 Type 2 diabetes mellitus with diabetic polyneuropathy M21.372 Foot drop, left foot Facility Procedures CPT4 Code: [id number] Description: 99213 - WOUND CARE VISIT-LEV 3 EST PT Modifier: Quantity: 1 Physician Procedures CPT4 Code: [id number] Description: 99213 - WC PHYS LEVEL 3 - EST PT Modifier: Quantity: 1 CPT4 Code: Description: ICD-10 Diagnosis Description E11.621 Type 2 diabetes mellitus with foot ulcer L97.523 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

## 2020-01-07 NOTE — Patient Instructions (Signed)

## 2020-01-09 ENCOUNTER — Ambulatory Visit (INDEPENDENT_AMBULATORY_CARE_PROVIDER_SITE_OTHER): Payer: Medicare Other

## 2020-01-09 ENCOUNTER — Other Ambulatory Visit: Payer: Self-pay

## 2020-01-09 ENCOUNTER — Other Ambulatory Visit: Payer: Medicare Other

## 2020-01-09 DIAGNOSIS — E041 Nontoxic single thyroid nodule: Secondary | ICD-10-CM | POA: Diagnosis not present

## 2020-01-09 DIAGNOSIS — I129 Hypertensive chronic kidney disease with stage 1 through stage 4 chronic kidney disease, or unspecified chronic kidney disease: Secondary | ICD-10-CM | POA: Diagnosis not present

## 2020-01-09 DIAGNOSIS — W010XXD Fall on same level from slipping, tripping and stumbling without subsequent striking against object, subsequent encounter: Secondary | ICD-10-CM | POA: Diagnosis not present

## 2020-01-09 DIAGNOSIS — R000 Tachycardia, unspecified: Secondary | ICD-10-CM | POA: Diagnosis not present

## 2020-01-09 DIAGNOSIS — Z794 Long term (current) use of insulin: Secondary | ICD-10-CM | POA: Diagnosis not present

## 2020-01-09 DIAGNOSIS — L02612 Cutaneous abscess of left foot: Secondary | ICD-10-CM | POA: Diagnosis not present

## 2020-01-09 DIAGNOSIS — G4733 Obstructive sleep apnea (adult) (pediatric): Secondary | ICD-10-CM | POA: Diagnosis not present

## 2020-01-09 DIAGNOSIS — C3491 Malignant neoplasm of unspecified part of right bronchus or lung: Secondary | ICD-10-CM | POA: Diagnosis not present

## 2020-01-09 DIAGNOSIS — E11621 Type 2 diabetes mellitus with foot ulcer: Secondary | ICD-10-CM | POA: Diagnosis not present

## 2020-01-09 DIAGNOSIS — J45909 Unspecified asthma, uncomplicated: Secondary | ICD-10-CM | POA: Diagnosis not present

## 2020-01-09 DIAGNOSIS — Z9181 History of falling: Secondary | ICD-10-CM | POA: Diagnosis not present

## 2020-01-09 DIAGNOSIS — E1142 Type 2 diabetes mellitus with diabetic polyneuropathy: Secondary | ICD-10-CM | POA: Diagnosis not present

## 2020-01-09 DIAGNOSIS — L97429 Non-pressure chronic ulcer of left heel and midfoot with unspecified severity: Secondary | ICD-10-CM | POA: Diagnosis not present

## 2020-01-09 DIAGNOSIS — Z0000 Encounter for general adult medical examination without abnormal findings: Secondary | ICD-10-CM

## 2020-01-09 DIAGNOSIS — Z87891 Personal history of nicotine dependence: Secondary | ICD-10-CM | POA: Diagnosis not present

## 2020-01-09 DIAGNOSIS — S2232XD Fracture of one rib, left side, subsequent encounter for fracture with routine healing: Secondary | ICD-10-CM | POA: Diagnosis not present

## 2020-01-09 DIAGNOSIS — E1165 Type 2 diabetes mellitus with hyperglycemia: Secondary | ICD-10-CM | POA: Diagnosis not present

## 2020-01-09 DIAGNOSIS — N1831 Chronic kidney disease, stage 3a: Secondary | ICD-10-CM | POA: Diagnosis not present

## 2020-01-09 NOTE — Progress Notes (Signed)
Subjective:   [name] is a 74 y.o. male who presents for Medicare Annual/Subsequent preventive examination.  Review of Systems: N/A     I connected with the patient today by telephone and verified that I am speaking with the correct person using two identifiers. Location patient: home Location nurse: work Persons participating in the telephone visit: patient, nurse.   I discussed the limitations, risks, security and privacy concerns of performing an evaluation and management service by telephone and the availability of in person appointments. I also discussed with the patient that there may be a patient responsible charge related to this service. The patient expressed understanding and verbally consented to this telephonic visit.        Cardiac Risk Factors include: advanced age (>55men, >38 women);diabetes mellitus;male gender     Objective:    Today's Vitals   01/09/20 1530  PainSc: 0-No pain   There is no height or weight on file to calculate BMI.  Advanced Directives 01/09/2020 01/02/2020 01/01/2020 11/30/2019 11/23/2019 11/22/2019 11/19/2019  Does Patient Have a Medical Advance Directive? No No No No No No No  Would patient like information on creating a medical advance directive? No - Patient declined No - Patient declined No - Patient declined No - Patient declined No - Patient declined No - Patient declined No - Patient declined    Current Medications (verified) Outpatient Encounter Medications as of 01/09/2020  Medication Sig  . acetaminophen (QC ACETAMINOPHEN 8HR ARTH PAIN) 650 MG CR tablet Take 650 mg by mouth every 8 (eight) hours as needed for pain.  [name] Kitchen ampicillin (PRINCIPEN) 500 MG capsule Take 500 mg by mouth daily as needed (flare up).   . Ascorbic Acid (VITAMIN C) 1000 MG tablet Take 1,000 mg by mouth daily.  [name] Kitchen aspirin EC 81 MG tablet Take 81 mg by mouth daily. Swallow whole.  [name] Kitchen atenolol (TENORMIN) 50 MG tablet Take 50 mg by mouth daily.  . cholecalciferol  (VITAMIN D) 1000 UNITS tablet Take 2,000 Units by mouth daily.   . ciprofloxacin (CIPRO) 500 MG tablet Take 1 tablet (500 mg total) by mouth 2 (two) times daily.  [name] Kitchen donepezil (ARICEPT) 10 MG tablet TAKE 1 TABLET BY MOUTH AT BEDTIME (Patient taking differently: Take 10 mg by mouth at bedtime. )  . fluticasone (FLONASE) 50 MCG/ACT nasal spray PLACE 2 SPRAYS IN EACH NOSTRIL DAILY (Patient taking differently: Place 2 sprays into both nostrils daily as needed for allergies or rhinitis. )  . Hyprom-Naphaz-Polysorb-Zn Sulf (CLEAR EYES COMPLETE OP) Place 1 drop into both eyes daily as needed (dry eyes).  . Insulin Pen Needle (PEN NEEDLES) 31G X 5 MM MISC 1 each by Does not apply route 3 (three) times daily. To inject insulin E11.42  . insulin regular human CONCENTRATED (HUMULIN R U-500 KWIKPEN) 500 UNIT/ML kwikpen Inject under skin 80-90 units before b'fast, 60 units before lunch, 80-90 units vefore dinner (Patient taking differently: Inject 20-40 Units into the skin See admin instructions. Takes 35-40 units before breakfast, 20 units at lunch and 35-40 units at night)  . lisinopril (ZESTRIL) 10 MG tablet Take 10 mg by mouth daily.  . memantine (NAMENDA) 10 MG tablet TAKE 1 TABLET BY MOUTH TWICE A DAY (Patient taking differently: Take 10 mg by mouth 2 (two) times daily. )  . montelukast (SINGULAIR) 10 MG tablet Take 1 tablet (10 mg total) by mouth at bedtime.  [name] Kitchen omeprazole (PRILOSEC) 40 MG capsule Take 1 capsule (40 mg total) by mouth 2 (two)  times daily before a meal. (Patient taking differently: Take 20 mg by mouth daily. Taking OTC daily.)  . potassium chloride SA (KLOR-CON) 20 MEQ tablet Take 1 tablet (20 mEq total) by mouth daily.  . prochlorperazine (COMPAZINE) 10 MG tablet Take 1 tablet (10 mg total) by mouth every 6 (six) hours as needed. (Patient taking differently: Take 10 mg by mouth every 6 (six) hours as needed for nausea. )  . simvastatin (ZOCOR) 40 MG tablet TAKE 1 TABLET BY MOUTH AT BEDTIME  (Patient taking differently: Take 40 mg by mouth every evening. )  . sucralfate (CARAFATE) 1 g tablet Take 1 tablet (1 g total) by mouth 4 (four) times daily. Dissolve each tablet in 15 cc water before use.  . venlafaxine XR (EFFEXOR-XR) 75 MG 24 hr capsule TAKE 1 CAPSULE BY MOUTH EVERY MORNING AND 2 CAPSULES AT BEDTIME   No facility-administered encounter medications on file as of 01/09/2020.    Allergies (verified) Tetracycline   History: Past Medical History:  Diagnosis Date  . Allergic rhinitis due to pollen   . Cancer ([place])   . Chronic kidney disease, stage III (moderate) 12/19/2012  . Depression   . Diabetes mellitus ([place])   . Diverticulosis   . Erectile dysfunction associated with type 2 diabetes mellitus ([place]) 05/28/2015  . Former very heavy cigarette smoker (more than 40 per day) 10/07/2014  . GERD (gastroesophageal reflux disease)   . Gout   . Hyperlipidemia   . Hypertension   . Hypogonadism male 06/14/2012  . Iron deficiency   . Memory loss   . Osteoarthritis   . Personal history of colonic polyps    1996   Past Surgical History:  Procedure Laterality Date  . BRONCHIAL NEEDLE ASPIRATION BIOPSY  10/17/2019   Procedure: BRONCHIAL NEEDLE ASPIRATION BIOPSIES;  Surgeon: Juanito Doom, MD;  Location: [facility];  Service: Cardiopulmonary;;  . CARDIAC CATHETERIZATION  11/2011   [facility]  . CHOLECYSTECTOMY  1980  . ENDOBRONCHIAL ULTRASOUND Bilateral 10/17/2019   Procedure: ENDOBRONCHIAL ULTRASOUND;  Surgeon: Juanito Doom, MD;  Location: [facility];  Service: Cardiopulmonary;  Laterality: Bilateral;  . ESOPHAGOGASTRODUODENOSCOPY (EGD) WITH PROPOFOL N/A 09/27/2017   Procedure: ESOPHAGOGASTRODUODENOSCOPY (EGD) WITH PROPOFOL;  Surgeon: Lin Landsman, MD;  Location: [facility];  Service: Gastroenterology;  Laterality: N/A;  . EYE SURGERY Right 07/29/2016   [facility] - Dr. Manuella Ghazi  . EYE SURGERY Left 08/23/2106   [facility] - Dr. Manuella Ghazi  . HEMOSTASIS CONTROL  10/17/2019    Procedure: HEMOSTASIS CONTROL;  Surgeon: Juanito Doom, MD;  Location: [facility];  Service: Cardiopulmonary;;  . IR IMAGING GUIDED PORT INSERTION  11/30/2019  . IRRIGATION AND DEBRIDEMENT ABSCESS Left 11/23/2019   Procedure: IRRIGATION AND DEBRIDEMENT ABSCESS;  Surgeon: Melina Schools, MD;  Location: [facility];  Service: Orthopedics;  Laterality: Left;  . TOTAL HIP ARTHROPLASTY  2009   Dr. Gladstone Lighter  . TOTAL HIP ARTHROPLASTY     Family History  Problem Relation Age of Onset  . Diabetes Mother   . Alzheimer's disease Mother   . Diabetes Father   . Lung cancer Father        Age 71  . Stroke Father   . Heart attack Father   . Lung cancer Sister        lung  . Heart disease Maternal Grandfather   . COPD Sister   . Heart attack Sister   . Heart disease Sister    Social History   Socioeconomic History  . Marital status:  Widowed    Spouse name: Not on file  . Number of children: 4  . Years of education: 12th  . Highest education level: Not on file  Occupational History  . Occupation: Counsellor  . Occupation: retired Magazine features editor: [employer]  Tobacco Use  . Smoking status: Former Smoker    Packs/day: 3.00    Years: 35.00    Pack years: 105.00    Types: Cigarettes    Quit date: 04/12/2000    Years since quitting: 19.7  . Smokeless tobacco: Never Used  Vaping Use  . Vaping Use: Never used  Substance and Sexual Activity  . Alcohol use: No  . Drug use: No  . Sexual activity: Never  Other Topics Concern  . Not on file  Social History Narrative   No regular exercise   Caffeine use: yes, dt coke   Lives at home with his mother lives with him.   Wife Fraser Din recently deceased   Right-handed.   Social Determinants of Health   Financial Resource Strain: Low Risk   . Difficulty of Paying Living Expenses: Not hard at all  Food Insecurity: No Food Insecurity  . Worried About Charity fundraiser in the Last Year: Never true  . Ran Out of Food in the Last  Year: Never true  Transportation Needs: No Transportation Needs  . Lack of Transportation (Medical): No  . Lack of Transportation (Non-Medical): No  Physical Activity: Inactive  . Days of Exercise per Week: 0 days  . Minutes of Exercise per Session: 0 min  Stress: No Stress Concern Present  . Feeling of Stress : Not at all  Social Connections:   . Frequency of Communication with Friends and Family: Not on file  . Frequency of Social Gatherings with Friends and Family: Not on file  . Attends Religious Services: Not on file  . Active Member of Clubs or Organizations: Not on file  . Attends Archivist Meetings: Not on file  . Marital Status: Not on file    Tobacco Counseling Counseling given: Not Answered   Clinical Intake:  Pre-visit preparation completed: Yes  Pain : No/denies pain Pain Score: 0-No pain     Nutritional Risks: Nausea/ vomitting/ diarrhea (receiving chemo) Diabetes: Yes CBG done?: No Did pt. bring in CBG monitor from home?: No  How often do you need to have someone help you when you read instructions, pamphlets, or other written materials from your doctor or pharmacy?: 1 - Never What is the last grade level you completed in school?: 12th  Diabetic: Yes  Nutrition Risk Assessment:  Has the patient had any N/V/D within the last 2 months?  No  Does the patient have any non-healing wounds?  No  Has the patient had any unintentional weight loss or weight gain?  No   Diabetes:  Is the patient diabetic?  Yes  If diabetic, was a CBG obtained today?  No  Did the patient bring in their glucometer from home?  No  How often do you monitor your CBG's? 4 times daily.   Financial Strains and Diabetes Management:  Are you having any financial strains with the device, your supplies or your medication? No .  Does the patient want to be seen by Chronic Care Management for management of their diabetes?  No  Would the patient like to be referred to a  Nutritionist or for Diabetic Management?  No   Diabetic Exams:  Diabetic Eye Exam:  Completed 08/13/2019 Diabetic Foot Exam: Completed 03/15/2019   Interpreter Needed?: No  Information entered by :: CJohnson, LPN   Activities of Daily Living In your present state of health, do you have any difficulty performing the following activities: 01/09/2020 11/30/2019  Hearing? N N  Vision? N N  Difficulty concentrating or making decisions? N N  Walking or climbing stairs? N N  Dressing or bathing? N N  Doing errands, shopping? N -  Preparing Food and eating ? N -  Using the Toilet? N -  In the past six months, have you accidently leaked urine? Y -  Comment sometimes not often -  Do you have problems with loss of bowel control? N -  Managing your Medications? N -  Managing your Finances? N -  Housekeeping or managing your Housekeeping? N -  Some recent data might be hidden    Patient Care Team: [name], MD as PCP - General Carlean Purl Ofilia Neas, MD as Consulting Physician (Gastroenterology) Philemon Kingdom, MD as Consulting Physician (Internal Medicine) [name], RN as [place] Management Debbora Dus, [facility] as Pharmacist (Pharmacist) Valrie Hart, RN as Oncology Nurse Navigator  Indicate any recent Medical Services you may have received from other than [facility] providers in the past year (date may be approximate).     Assessment:   This is a routine wellness examination for Ryheem.  Hearing/Vision screen  Hearing Screening   125Hz  250Hz  500Hz  1000Hz  2000Hz  3000Hz  4000Hz  6000Hz  8000Hz   Right ear:           Left ear:           Vision Screening Comments: Patient gets annual eye exams    Dietary issues and exercise activities discussed: Current Exercise Habits: The patient does not participate in regular exercise at present, Exercise limited by: None identified  Goals    .  Eat more fruits and vegetables      Starting 12/26/2017, I will continue to  eat at least 3 servings of fresh fruits and vegetables.     Elizebeth Koller radiation treatment and get better (pt-stated)      CARE PLAN ENTRY (see longitudinal plan of care for additional care plan information)  Current Barriers:  [name] Kitchen Knowledge Deficits related to new cancer diagnosis  Nurse Case Manager Clinical Goal(s):  [name] Kitchen Over the next 28 days, patient will verbalize understanding of plan for cancer treatments . Over the next 31 days, patient will not experience hospital admission. Hospital Admissions in last 6 months = 2 . Over the next 28 days, patient will attend all scheduled medical appointments: with cancer center  Interventions:  . Inter-disciplinary care team collaboration (see longitudinal plan of care) . Evaluation of current treatment plan related to radiation therapy and patient's adherence to plan as established by provider. . Discussed plans with patient for ongoing care management follow up and provided patient with direct contact information for care management team . Reviewed scheduled/upcoming provider appointments including: cancer radiation therapy  Patient Self Care Activities:  . Attends all scheduled provider appointments . Performs ADL's independently . Calls provider office for new concerns or questions  Initial goal documentation     .  Patient Stated      Starting 01/03/2019, Patient wants to maintain medications as prescribed.     .  Patient Stated      01/09/2020, I will maintain and continue medications as prescribed.     .  Pharmacy Care Plan (pt-stated)  CARE PLAN ENTRY (see longitudinal plan of care for additional care plan information)  Current Barriers:  . Chronic Disease Management support, education, and care coordination needs related to Hypertension, Hyperlipidemia, and Diabetes   Hypertension BP Readings from Last 3 Encounters:  12/15/19 (!) 152/88  12/13/19 (!) 153/89  12/12/19 (!) 161/89   . Pharmacist Clinical Goal(s): o Over  the next 90 days, patient will work with PharmD and providers to maintain BP goal <130/80 . Current regimen:  o Lisinopril 10 mg daily o Atenolol 50 mg daily . Interventions: o Reviewed patient's medication list.  o Reviewed patient's home blood pressure readings. Home readings are at or below goal. Patient notes anxiety before treatment at cancer center.  . Patient self care activities - Over the next 90 days, patient will: o Check BP weekly, document, and provide at future appointments o Ensure daily salt intake < 2300 mg/day  Hyperlipidemia Lab Results  Component Value Date/Time   LDLCALC 43 04/24/2009 12:00 AM   LDLDIRECT 82.0 01/03/2019 11:22 AM   . Pharmacist Clinical Goal(s): o Over the next 90 days, patient will work with PharmD and providers to achieve LDL goal < 70 . Current regimen:  o Simvastatin 40 mg daily.  . Interventions: o Reviewed patient's medications.  o Dicussed diet and exercise.  o Consider discussing benefit of high intensity statin at next pharmacist visit. Focused on blood sugar management during initial visit.  . Patient self care activities - Over the next 90 days, patient will: o Continue to eat a variety of fruits, vegetables, whole grains.  o Continue to take medication as prescribed.   Diabetes Lab Results  Component Value Date/Time   HGBA1C 10.2 (H) 11/23/2019 02:33 AM   HGBA1C 10.6 (H) 10/10/2019 12:21 PM   . Pharmacist Clinical Goal(s): o Over the next 90 days, patient will work with PharmD and providers to achieve A1c goal <7% . Current regimen:  o Humulin U500 30 units tid . Interventions: o Reviewed home blood sugar readings.  o Discussed diet and the benefit of limiting fruit in diet.  o Coordinating with endocrinology to determine if resuming Ozempic is most appropriate next step. Pharmacist will coordinate patient assistance forms if needed.  o Encouraged patient the benefits of tighter blood sugar control.  . Patient self care  activities - Over the next 90 days, patient will: o Check blood sugar 3-4 times daily, document, and provide at future appointments o Contact provider with any episodes of hypoglycemia  Medication management . Pharmacist Clinical Goal(s): o Over the next 90 days, patient will work with PharmD and providers to achieve optimal medication adherence . Current pharmacy: [facility] . Interventions o Comprehensive medication review performed. o Continue current medication management strategy . Patient self care activities - Over the next 90 days, patient will: o Focus on medication adherence by continuing to use pill box o Take medications as prescribed o Report any questions or concerns to PharmD and/or provider(s)  Initial goal documentation       Depression Screen PHQ 2/9 Scores 01/09/2020 09/26/2019 01/03/2019 12/26/2017 08/03/2017 12/09/2016 08/26/2015  PHQ - 2 Score 0 0 0 5 5 2 2   PHQ- 9 Score 0 - 0 12 13 6  -    Fall Risk Fall Risk  01/09/2020 09/26/2019 01/03/2019 12/26/2017 12/09/2016  Falls in the past year? 0 1 1 Yes Yes  Comment - - - multiple falls (7) due to balance issues; denies injury -  Number falls in past yr: 0  0 1 2 or more 2 or more  Comment - - - - -  Injury with Fall? 0 0 0 No Yes  Risk Factor Category  - - - High Fall Risk -  Risk for fall due to : Medication side effect - History of fall(s);Impaired mobility;Impaired balance/gait;Medication side effect Impaired balance/gait;Impaired mobility;History of fall(s) Medication side effect  Follow up Falls evaluation completed;Falls prevention discussed Falls prevention discussed Falls evaluation completed;Falls prevention discussed - -    Any stairs in or around the home? Yes  If so, are there any without handrails? No  Home free of loose throw rugs in walkways, pet beds, electrical cords, etc? Yes  Adequate lighting in your home to reduce risk of falls? Yes   ASSISTIVE DEVICES UTILIZED TO PREVENT FALLS:  Life alert? No   Use of a cane, walker or w/c? Yes , wheelchair Grab bars in the bathroom? No  Shower chair or bench in shower? No  Elevated toilet seat or a handicapped toilet? No   TIMED UP AND GO:  Was the test performed? N/A, telephonic visit .   Cognitive Function: MMSE - Mini Mental State Exam 01/09/2020 01/03/2019 05/08/2018 12/26/2017 12/09/2016  Orientation to time 5 5 5 5 5   Orientation to Place 5 5 5 5 5   Registration 3 3 3 3 3   Attention/ Calculation 5 4 3  0 0  Recall 3 3 3 3 3   Language- name 2 objects - - 2 0 0  Language- repeat 1 1 1 1 1   Language- follow 3 step command - - 3 3 3   Language- read & follow direction - - 1 0 0  Write a sentence - - 1 0 0  Copy design - - 1 0 0  Total score - - 28 20 20   Mini Cog  Mini-Cog screen was completed. Maximum score is 22. A value of 0 denotes this part of the MMSE was not completed or the patient failed this part of the Mini-Cog screening.       Immunizations Immunization History  Administered Date(s) Administered  . Fluad Quad(high Dose 65+) 12/20/2018  . Influenza Split 12/29/2010  . Influenza Whole 01/27/2009, 02/05/2010  . Influenza, High Dose Seasonal PF 02/03/2017, 12/21/2018  . Influenza, Seasonal, Injecte, Preservative Fre 06/14/2012  . Influenza,inj,Quad PF,6+ Mos 12/18/2012, 12/19/2013, 03/19/2015, 01/05/2016, 01/16/2018  . Influenza,inj,quad, With Preservative 01/10/2017  . PFIZER SARS-COV-2 Vaccination 06/15/2019, 07/18/2019  . Pneumococcal Conjugate-13 12/19/2013  . Pneumococcal Polysaccharide-23 10/25/2008, 08/26/2015  . Td 04/13/2007    TDAP status: Due,  will discuss with provider first due to current chemotherapy treatments Flu Vaccine status: due, will discuss with provider first due to current chemotherapy treatments Pneumococcal vaccine status: Up to date Covid-19 vaccine status: Completed vaccines  Qualifies for Shingles Vaccine? Yes   Zostavax completed No   Shingrix Completed?: No. due, will discuss with  provider first due to current chemotherapy treatments.    Screening Tests Health Maintenance  Topic Date Due  . INFLUENZA VACCINE  11/11/2019  . TETANUS/TDAP  01/09/2024 (Originally 04/12/2017)  . FOOT EXAM  03/14/2020  . HEMOGLOBIN A1C  05/18/2020  . OPHTHALMOLOGY EXAM  08/12/2020  . COLONOSCOPY  09/15/2020  . COVID-19 Vaccine  Completed  . Hepatitis C Screening  Completed  . PNA vac Low Risk Adult  Completed    Health Maintenance  Health Maintenance Due  Topic Date Due  . INFLUENZA VACCINE  11/11/2019    Colorectal cancer screening: Completed 09/16/2010. Repeat every 10 years  Lung Cancer Screening: (Low Dose CT Chest recommended if Age 47-80 years, 30 pack-year currently smoking OR have quit w/in 15 years.) does not qualify.    Additional Screening:  Hepatitis C Screening: does qualify; Completed 08/26/2015  Vision Screening: Recommended annual ophthalmology exams for early detection of glaucoma and other disorders of the eye. Is the patient up to date with their annual eye exam?  Yes  Who is the provider or what is the name of the office in which the patient attends annual eye exams? [facility] If pt is not established with a provider, would they like to be referred to a provider to establish care? No .   Dental Screening: Recommended annual dental exams for proper oral hygiene  Community Resource Referral / Chronic Care Management: CRR required this visit?  No   CCM required this visit?  No      Plan:     I have personally reviewed and noted the following in the patient's chart:   . Medical and social history . Use of alcohol, tobacco or illicit drugs  . Current medications and supplements . Functional ability and status . Nutritional status . Physical activity . Advanced directives . List of other physicians . Hospitalizations, surgeries, and ER visits in previous 12 months . Vitals . Screenings to include cognitive, depression, and falls . Referrals  and appointments  In addition, I have reviewed and discussed with patient certain preventive protocols, quality metrics, and best practice recommendations. A written personalized care plan for preventive services as well as general preventive health recommendations were provided to patient.   Due to this being a telephonic visit, the after visit summary with patients personalized plan was offered to patient via office or my-chart. Patient preferred to pick up at office at next visit or via mychart.   Riggins, Cisek, LPN   [id number]

## 2020-01-09 NOTE — Patient Instructions (Signed)
[name] , Thank you for taking time to come for your Medicare Wellness Visit. I appreciate your ongoing commitment to your health goals. Please review the following plan we discussed and let me know if I can assist you in the future.   Screening recommendations/referrals: Colonoscopy: Up to date, completed 09/16/2010, due 09/2020 Recommended yearly ophthalmology/optometry visit for glaucoma screening and checkup Recommended yearly dental visit for hygiene and checkup  Vaccinations: Influenza vaccine: due, will discuss with provider first due to current chemotherapy treatments Pneumococcal vaccine: Completed series Tdap vaccine: declined (insurance/financial)  Shingles vaccine: due, will discuss with provider first due to current chemotherapy treatments Covid-19: Completed series  Advanced directives: Advance directive discussed with you today. Even though you declined this today please call our office should you change your mind and we can give you the proper paperwork for you to fill out.  Conditions/risks identified: Diabetes  Next appointment: Follow up in one year for your annual wellness visit.   Preventive Care 74 Years and Older, Male Preventive care refers to lifestyle choices and visits with your health care provider that can promote health and wellness. What does preventive care include?  A yearly physical exam. This is also called an annual well check.  Dental exams once or twice a year.  Routine eye exams. Ask your health care provider how often you should have your eyes checked.  Personal lifestyle choices, including:  Daily care of your teeth and gums.  Regular physical activity.  Eating a healthy diet.  Avoiding tobacco and drug use.  Limiting alcohol use.  Practicing safe sex.  Taking low doses of aspirin every day.  Taking vitamin and mineral supplements as recommended by your health care provider. What happens during an annual well check? The  services and screenings done by your health care provider during your annual well check will depend on your age, overall health, lifestyle risk factors, and family history of disease. Counseling  Your health care provider may ask you questions about your:  Alcohol use.  Tobacco use.  Drug use.  Emotional well-being.  Home and relationship well-being.  Sexual activity.  Eating habits.  History of falls.  Memory and ability to understand (cognition).  Work and work Statistician. Screening  You may have the following tests or measurements:  Height, weight, and BMI.  Blood pressure.  Lipid and cholesterol levels. These may be checked every 5 years, or more frequently if you are over 8 years old.  Skin check.  Lung cancer screening. You may have this screening every year starting at age 60 if you have a 30-pack-year history of smoking and currently smoke or have quit within the past 15 years.  Fecal occult blood test (FOBT) of the stool. You may have this test every year starting at age 85.  Flexible sigmoidoscopy or colonoscopy. You may have a sigmoidoscopy every 5 years or a colonoscopy every 10 years starting at age 55.  Prostate cancer screening. Recommendations will vary depending on your family history and other risks.  Hepatitis C blood test.  Hepatitis B blood test.  Sexually transmitted disease (STD) testing.  Diabetes screening. This is done by checking your blood sugar (glucose) after you have not eaten for a while (fasting). You may have this done every 1-3 years.  Abdominal aortic aneurysm (AAA) screening. You may need this if you are a current or former smoker.  Osteoporosis. You may be screened starting at age 61 if you are at high risk. Talk with your  health care provider about your test results, treatment options, and if necessary, the need for more tests. Vaccines  Your health care provider may recommend certain vaccines, such as:  Influenza  vaccine. This is recommended every year.  Tetanus, diphtheria, and acellular pertussis (Tdap, Td) vaccine. You may need a Td booster every 10 years.  Zoster vaccine. You may need this after age 64.  Pneumococcal 13-valent conjugate (PCV13) vaccine. One dose is recommended after age 43.  Pneumococcal polysaccharide (PPSV23) vaccine. One dose is recommended after age 56. Talk to your health care provider about which screenings and vaccines you need and how often you need them. This information is not intended to replace advice given to you by your health care provider. Make sure you discuss any questions you have with your health care provider. Document Released: 04/25/2015 Document Revised: 12/17/2015 Document Reviewed: 01/28/2015 Elsevier Interactive Patient Education  2017 [place] Prevention in the Home Falls can cause injuries. They can happen to people of all ages. There are many things you can do to make your home safe and to help prevent falls. What can I do on the outside of my home?  Regularly fix the edges of walkways and driveways and fix any cracks.  Remove anything that might make you trip as you walk through a door, such as a raised step or threshold.  Trim any bushes or trees on the path to your home.  Use bright outdoor lighting.  Clear any walking paths of anything that might make someone trip, such as rocks or tools.  Regularly check to see if handrails are loose or broken. Make sure that both sides of any steps have handrails.  Any raised decks and porches should have guardrails on the edges.  Have any leaves, snow, or ice cleared regularly.  Use sand or salt on walking paths during winter.  Clean up any spills in your garage right away. This includes oil or grease spills. What can I do in the bathroom?  Use night lights.  Install grab bars by the toilet and in the tub and shower. Do not use towel bars as grab bars.  Use non-skid mats or decals  in the tub or shower.  If you need to sit down in the shower, use a plastic, non-slip stool.  Keep the floor dry. Clean up any water that spills on the floor as soon as it happens.  Remove soap buildup in the tub or shower regularly.  Attach bath mats securely with double-sided non-slip rug tape.  Do not have throw rugs and other things on the floor that can make you trip. What can I do in the bedroom?  Use night lights.  Make sure that you have a light by your bed that is easy to reach.  Do not use any sheets or blankets that are too big for your bed. They should not hang down onto the floor.  Have a firm chair that has side arms. You can use this for support while you get dressed.  Do not have throw rugs and other things on the floor that can make you trip. What can I do in the kitchen?  Clean up any spills right away.  Avoid walking on wet floors.  Keep items that you use a lot in easy-to-reach places.  If you need to reach something above you, use a strong step stool that has a grab bar.  Keep electrical cords out of the way.  Do not use  floor polish or wax that makes floors slippery. If you must use wax, use non-skid floor wax.  Do not have throw rugs and other things on the floor that can make you trip. What can I do with my stairs?  Do not leave any items on the stairs.  Make sure that there are handrails on both sides of the stairs and use them. Fix handrails that are broken or loose. Make sure that handrails are as long as the stairways.  Check any carpeting to make sure that it is firmly attached to the stairs. Fix any carpet that is loose or worn.  Avoid having throw rugs at the top or bottom of the stairs. If you do have throw rugs, attach them to the floor with carpet tape.  Make sure that you have a light switch at the top of the stairs and the bottom of the stairs. If you do not have them, ask someone to add them for you. What else can I do to help  prevent falls?  Wear shoes that:  Do not have high heels.  Have rubber bottoms.  Are comfortable and fit you well.  Are closed at the toe. Do not wear sandals.  If you use a stepladder:  Make sure that it is fully opened. Do not climb a closed stepladder.  Make sure that both sides of the stepladder are locked into place.  Ask someone to hold it for you, if possible.  Clearly mark and make sure that you can see:  Any grab bars or handrails.  First and last steps.  Where the edge of each step is.  Use tools that help you move around (mobility aids) if they are needed. These include:  Canes.  Walkers.  Scooters.  Crutches.  Turn on the lights when you go into a dark area. Replace any light bulbs as soon as they burn out.  Set up your furniture so you have a clear path. Avoid moving your furniture around.  If any of your floors are uneven, fix them.  If there are any pets around you, be aware of where they are.  Review your medicines with your doctor. Some medicines can make you feel dizzy. This can increase your chance of falling. Ask your doctor what other things that you can do to help prevent falls. This information is not intended to replace advice given to you by your health care provider. Make sure you discuss any questions you have with your health care provider. Document Released: 01/23/2009 Document Revised: 09/04/2015 Document Reviewed: 05/03/2014 Elsevier Interactive Patient Education  2017 [employer].

## 2020-01-09 NOTE — Progress Notes (Signed)
PCP notes:  Health Maintenance: Flu- due, will discuss with provider first due to current chemotherapy treatments Tdap- due, will discuss with provider first due to current chemotherapy treatments Shingrix- due, will discuss with provider first due to current chemotherapy treatments    Abnormal Screenings: none   Patient concerns: none   Nurse concerns: none   Next PCP appt.: 01/16/2020 @ 11:40 am

## 2020-01-11 DIAGNOSIS — G4733 Obstructive sleep apnea (adult) (pediatric): Secondary | ICD-10-CM | POA: Diagnosis not present

## 2020-01-11 DIAGNOSIS — S2232XD Fracture of one rib, left side, subsequent encounter for fracture with routine healing: Secondary | ICD-10-CM | POA: Diagnosis not present

## 2020-01-11 DIAGNOSIS — E1165 Type 2 diabetes mellitus with hyperglycemia: Secondary | ICD-10-CM | POA: Diagnosis not present

## 2020-01-11 DIAGNOSIS — Z9181 History of falling: Secondary | ICD-10-CM | POA: Diagnosis not present

## 2020-01-11 DIAGNOSIS — Z794 Long term (current) use of insulin: Secondary | ICD-10-CM | POA: Diagnosis not present

## 2020-01-11 DIAGNOSIS — R000 Tachycardia, unspecified: Secondary | ICD-10-CM | POA: Diagnosis not present

## 2020-01-11 DIAGNOSIS — N1831 Chronic kidney disease, stage 3a: Secondary | ICD-10-CM | POA: Diagnosis not present

## 2020-01-11 DIAGNOSIS — Z87891 Personal history of nicotine dependence: Secondary | ICD-10-CM | POA: Diagnosis not present

## 2020-01-11 DIAGNOSIS — C3491 Malignant neoplasm of unspecified part of right bronchus or lung: Secondary | ICD-10-CM | POA: Diagnosis not present

## 2020-01-11 DIAGNOSIS — E1142 Type 2 diabetes mellitus with diabetic polyneuropathy: Secondary | ICD-10-CM | POA: Diagnosis not present

## 2020-01-11 DIAGNOSIS — J45909 Unspecified asthma, uncomplicated: Secondary | ICD-10-CM | POA: Diagnosis not present

## 2020-01-11 DIAGNOSIS — E11621 Type 2 diabetes mellitus with foot ulcer: Secondary | ICD-10-CM | POA: Diagnosis not present

## 2020-01-11 DIAGNOSIS — L97429 Non-pressure chronic ulcer of left heel and midfoot with unspecified severity: Secondary | ICD-10-CM | POA: Diagnosis not present

## 2020-01-11 DIAGNOSIS — W010XXD Fall on same level from slipping, tripping and stumbling without subsequent striking against object, subsequent encounter: Secondary | ICD-10-CM | POA: Diagnosis not present

## 2020-01-11 DIAGNOSIS — L02612 Cutaneous abscess of left foot: Secondary | ICD-10-CM | POA: Diagnosis not present

## 2020-01-11 DIAGNOSIS — E041 Nontoxic single thyroid nodule: Secondary | ICD-10-CM | POA: Diagnosis not present

## 2020-01-11 DIAGNOSIS — I129 Hypertensive chronic kidney disease with stage 1 through stage 4 chronic kidney disease, or unspecified chronic kidney disease: Secondary | ICD-10-CM | POA: Diagnosis not present

## 2020-01-14 ENCOUNTER — Other Ambulatory Visit: Payer: Self-pay | Admitting: Physician Assistant

## 2020-01-14 ENCOUNTER — Other Ambulatory Visit: Payer: Self-pay

## 2020-01-14 ENCOUNTER — Inpatient Hospital Stay: Payer: Medicare Other

## 2020-01-14 ENCOUNTER — Inpatient Hospital Stay: Payer: Medicare Other | Attending: Physician Assistant

## 2020-01-14 DIAGNOSIS — Z5111 Encounter for antineoplastic chemotherapy: Secondary | ICD-10-CM | POA: Insufficient documentation

## 2020-01-14 DIAGNOSIS — L97429 Non-pressure chronic ulcer of left heel and midfoot with unspecified severity: Secondary | ICD-10-CM | POA: Diagnosis not present

## 2020-01-14 DIAGNOSIS — C3491 Malignant neoplasm of unspecified part of right bronchus or lung: Secondary | ICD-10-CM | POA: Diagnosis not present

## 2020-01-14 DIAGNOSIS — R739 Hyperglycemia, unspecified: Secondary | ICD-10-CM | POA: Diagnosis not present

## 2020-01-14 DIAGNOSIS — S2232XD Fracture of one rib, left side, subsequent encounter for fracture with routine healing: Secondary | ICD-10-CM | POA: Diagnosis not present

## 2020-01-14 DIAGNOSIS — W010XXD Fall on same level from slipping, tripping and stumbling without subsequent striking against object, subsequent encounter: Secondary | ICD-10-CM | POA: Diagnosis not present

## 2020-01-14 DIAGNOSIS — L02612 Cutaneous abscess of left foot: Secondary | ICD-10-CM | POA: Diagnosis not present

## 2020-01-14 DIAGNOSIS — E11621 Type 2 diabetes mellitus with foot ulcer: Secondary | ICD-10-CM | POA: Diagnosis not present

## 2020-01-14 DIAGNOSIS — Z87891 Personal history of nicotine dependence: Secondary | ICD-10-CM | POA: Diagnosis not present

## 2020-01-14 DIAGNOSIS — E1142 Type 2 diabetes mellitus with diabetic polyneuropathy: Secondary | ICD-10-CM | POA: Diagnosis not present

## 2020-01-14 DIAGNOSIS — C3431 Malignant neoplasm of lower lobe, right bronchus or lung: Secondary | ICD-10-CM | POA: Insufficient documentation

## 2020-01-14 DIAGNOSIS — E1165 Type 2 diabetes mellitus with hyperglycemia: Secondary | ICD-10-CM | POA: Diagnosis not present

## 2020-01-14 DIAGNOSIS — R000 Tachycardia, unspecified: Secondary | ICD-10-CM | POA: Diagnosis not present

## 2020-01-14 DIAGNOSIS — Z23 Encounter for immunization: Secondary | ICD-10-CM | POA: Diagnosis not present

## 2020-01-14 DIAGNOSIS — J45909 Unspecified asthma, uncomplicated: Secondary | ICD-10-CM | POA: Diagnosis not present

## 2020-01-14 DIAGNOSIS — C3490 Malignant neoplasm of unspecified part of unspecified bronchus or lung: Secondary | ICD-10-CM

## 2020-01-14 DIAGNOSIS — T451X5A Adverse effect of antineoplastic and immunosuppressive drugs, initial encounter: Secondary | ICD-10-CM

## 2020-01-14 DIAGNOSIS — E041 Nontoxic single thyroid nodule: Secondary | ICD-10-CM | POA: Diagnosis not present

## 2020-01-14 DIAGNOSIS — Z794 Long term (current) use of insulin: Secondary | ICD-10-CM | POA: Diagnosis not present

## 2020-01-14 DIAGNOSIS — Z5189 Encounter for other specified aftercare: Secondary | ICD-10-CM | POA: Diagnosis not present

## 2020-01-14 DIAGNOSIS — N1831 Chronic kidney disease, stage 3a: Secondary | ICD-10-CM | POA: Diagnosis not present

## 2020-01-14 DIAGNOSIS — I129 Hypertensive chronic kidney disease with stage 1 through stage 4 chronic kidney disease, or unspecified chronic kidney disease: Secondary | ICD-10-CM | POA: Diagnosis not present

## 2020-01-14 DIAGNOSIS — Z9181 History of falling: Secondary | ICD-10-CM | POA: Diagnosis not present

## 2020-01-14 DIAGNOSIS — G4733 Obstructive sleep apnea (adult) (pediatric): Secondary | ICD-10-CM | POA: Diagnosis not present

## 2020-01-14 LAB — CBC WITH DIFFERENTIAL (CANCER CENTER ONLY)
Abs Immature Granulocytes: 0.32 10*3/uL — ABNORMAL HIGH (ref 0.00–0.07)
Basophils Absolute: 0 10*3/uL (ref 0.0–0.1)
Basophils Relative: 0 %
Eosinophils Absolute: 0 10*3/uL (ref 0.0–0.5)
Eosinophils Relative: 0 %
HCT: 21 % — ABNORMAL LOW (ref 39.0–52.0)
Hemoglobin: 7.3 g/dL — ABNORMAL LOW (ref 13.0–17.0)
Immature Granulocytes: 5 %
Lymphocytes Relative: 6 %
Lymphs Abs: 0.4 10*3/uL — ABNORMAL LOW (ref 0.7–4.0)
MCH: 29.8 pg (ref 26.0–34.0)
MCHC: 34.8 g/dL (ref 30.0–36.0)
MCV: 85.7 fL (ref 80.0–100.0)
Monocytes Absolute: 0.5 10*3/uL (ref 0.1–1.0)
Monocytes Relative: 8 %
Neutro Abs: 4.7 10*3/uL (ref 1.7–7.7)
Neutrophils Relative %: 81 %
Platelet Count: 45 10*3/uL — ABNORMAL LOW (ref 150–400)
RBC: 2.45 MIL/uL — ABNORMAL LOW (ref 4.22–5.81)
RDW: 15.9 % — ABNORMAL HIGH (ref 11.5–15.5)
WBC Count: 5.9 10*3/uL (ref 4.0–10.5)
nRBC: 0 % (ref 0.0–0.2)

## 2020-01-14 LAB — CMP (CANCER CENTER ONLY)
ALT: 10 U/L (ref 0–44)
AST: 9 U/L — ABNORMAL LOW (ref 15–41)
Albumin: 2.6 g/dL — ABNORMAL LOW (ref 3.5–5.0)
Alkaline Phosphatase: 100 U/L (ref 38–126)
Anion gap: 9 (ref 5–15)
BUN: 12 mg/dL (ref 8–23)
CO2: 26 mmol/L (ref 22–32)
Calcium: 8.5 mg/dL — ABNORMAL LOW (ref 8.9–10.3)
Chloride: 98 mmol/L (ref 98–111)
Creatinine: 1.32 mg/dL — ABNORMAL HIGH (ref 0.61–1.24)
GFR, Est AFR Am: >60 mL/min (ref >60)
GFR, Estimated: 53 mL/min — ABNORMAL LOW (ref >60)
Glucose, Bld: 400 mg/dL — ABNORMAL HIGH (ref 70–99)
Potassium: 3.7 mmol/L (ref 3.5–5.1)
Sodium: 133 mmol/L — ABNORMAL LOW (ref 135–145)
Total Bilirubin: 0.2 mg/dL — ABNORMAL LOW (ref 0.3–1.2)
Total Protein: 6.1 g/dL — ABNORMAL LOW (ref 6.5–8.1)

## 2020-01-14 MED ORDER — SODIUM CHLORIDE 0.9% FLUSH
10.0000 mL | INTRAVENOUS | Status: DC | PRN
  Administered 2020-01-14: 10 mL
  Filled 2020-01-14: qty 10

## 2020-01-14 MED ORDER — HEPARIN SOD (PORK) LOCK FLUSH 100 UNIT/ML IV SOLN
500.0000 [IU] | Freq: Once | INTRAVENOUS | Status: AC | PRN
  Administered 2020-01-14: 500 [IU]
  Filled 2020-01-14: qty 5

## 2020-01-14 NOTE — Patient Instructions (Signed)

## 2020-01-15 ENCOUNTER — Other Ambulatory Visit: Payer: Self-pay | Admitting: *Deleted

## 2020-01-15 ENCOUNTER — Other Ambulatory Visit: Payer: Self-pay | Admitting: Physician Assistant

## 2020-01-15 DIAGNOSIS — D6481 Anemia due to antineoplastic chemotherapy: Secondary | ICD-10-CM

## 2020-01-15 DIAGNOSIS — D649 Anemia, unspecified: Secondary | ICD-10-CM

## 2020-01-15 NOTE — Patient Outreach (Signed)
[facility]) Care Management  01/15/2020  JARRIUS HUARACHA 03-15-1946 [id number]   Call placed to member to follow up on management of cancer and foot wound.  Report he is doing "pretty good," still have home health coming to the home to perform dressing changes 3 times a week.  Denies having wound vac still in place, visits the wound care center every 2 weeks, next appointment Monday 10/11.  Still being seen at least weekly at the cancer center for treatments, infusions, and blood work.  Denies any complications of cancer treatments.  Report he is managing chronic medical conditions well, denies any abnormal blood pressure or blood sugars.  No concerns voiced at this time, encouraged to contact this care manager with questions.  Will follow up within the next month.  Goals Addressed              This Visit's Progress   .  Manage Fatigue (Tiredness)        Follow Up Date 11/3   - develop a new routine to improve sleep - eat healthy - maintain healthy weight    Why is this important?   Cancer treatment and its side effects can drain your energy. It can keep you from doing things you would like to do.  There are many things that you can do to manage fatigue.    Notes:     .  [facility] - Careful Skin Care        Follow Up Date 11/3   - clean and dry skin well - use lotion that is suggested by the doctor or nurse    Why is this important?   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   Taking really good care of your skin will help to keep your skin unbroken.    Notes:     .  COMPLETED: [facility] - Finish radiation treatment and get better (pt-stated)        CARE PLAN ENTRY (see longitudinal plan of care for additional care plan information)  Current Barriers:  [name] Kitchen Knowledge Deficits related to new cancer diagnosis  Nurse Case Manager Clinical Goal(s):  [name] Kitchen Over the next 28 days, patient will verbalize understanding of plan for cancer treatments . Over the  next 31 days, patient will not experience hospital admission. Hospital Admissions in last 6 months = 2 . Over the next 28 days, patient will attend all scheduled medical appointments: with cancer center  Interventions:  . Inter-disciplinary care team collaboration (see longitudinal plan of care) . Evaluation of current treatment plan related to radiation therapy and patient's adherence to plan as established by provider. . Discussed plans with patient for ongoing care management follow up and provided patient with direct contact information for care management team . Reviewed scheduled/upcoming provider appointments including: cancer radiation therapy  Patient Self Care Activities:  . Attends all scheduled provider appointments . Performs ADL's independently . Calls provider office for new concerns or questions  UPDATE: Resolving due to duplicate goal     .  [facility] - Make and Keep All Appointments        Follow Up Date 11/3   - call to cancel if needed - keep a calendar with appointment dates    Why is this important?   Part of staying healthy is seeing the doctor for follow-up care.  If you forget your appointments, there are some things you can do to stay on track.  Notes:       [name], [place], MSN [place] [phone]

## 2020-01-16 ENCOUNTER — Inpatient Hospital Stay: Payer: Medicare Other

## 2020-01-16 ENCOUNTER — Other Ambulatory Visit: Payer: Self-pay | Admitting: Emergency Medicine

## 2020-01-16 ENCOUNTER — Other Ambulatory Visit: Payer: Self-pay

## 2020-01-16 ENCOUNTER — Encounter: Payer: Medicare Other | Admitting: Family Medicine

## 2020-01-16 VITALS — BP 168/87 | HR 83 | Temp 98.3°F | Resp 18

## 2020-01-16 DIAGNOSIS — D6481 Anemia due to antineoplastic chemotherapy: Secondary | ICD-10-CM

## 2020-01-16 DIAGNOSIS — T451X5A Adverse effect of antineoplastic and immunosuppressive drugs, initial encounter: Secondary | ICD-10-CM

## 2020-01-16 DIAGNOSIS — Z5189 Encounter for other specified aftercare: Secondary | ICD-10-CM | POA: Diagnosis not present

## 2020-01-16 DIAGNOSIS — Z23 Encounter for immunization: Secondary | ICD-10-CM

## 2020-01-16 DIAGNOSIS — C3431 Malignant neoplasm of lower lobe, right bronchus or lung: Secondary | ICD-10-CM | POA: Diagnosis not present

## 2020-01-16 DIAGNOSIS — Z5111 Encounter for antineoplastic chemotherapy: Secondary | ICD-10-CM | POA: Diagnosis not present

## 2020-01-16 DIAGNOSIS — D649 Anemia, unspecified: Secondary | ICD-10-CM

## 2020-01-16 DIAGNOSIS — R739 Hyperglycemia, unspecified: Secondary | ICD-10-CM | POA: Diagnosis not present

## 2020-01-16 LAB — PREPARE RBC (CROSSMATCH)

## 2020-01-16 MED ORDER — ACETAMINOPHEN 325 MG PO TABS
650.0000 mg | ORAL_TABLET | Freq: Once | ORAL | Status: AC
  Administered 2020-01-16: 650 mg via ORAL

## 2020-01-16 MED ORDER — INFLUENZA VAC A&B SA ADJ QUAD 0.5 ML IM PRSY
PREFILLED_SYRINGE | INTRAMUSCULAR | Status: AC
  Filled 2020-01-16: qty 0.5

## 2020-01-16 MED ORDER — DIPHENHYDRAMINE HCL 25 MG PO CAPS
ORAL_CAPSULE | ORAL | Status: AC
  Filled 2020-01-16: qty 1

## 2020-01-16 MED ORDER — INFLUENZA VAC A&B SA ADJ QUAD 0.5 ML IM PRSY
0.5000 mL | PREFILLED_SYRINGE | Freq: Once | INTRAMUSCULAR | Status: AC
  Administered 2020-01-16: 0.5 mL via INTRAMUSCULAR

## 2020-01-16 MED ORDER — DIPHENHYDRAMINE HCL 25 MG PO CAPS
25.0000 mg | ORAL_CAPSULE | Freq: Once | ORAL | Status: AC
  Administered 2020-01-16: 25 mg via ORAL

## 2020-01-16 MED ORDER — SODIUM CHLORIDE 0.9% FLUSH
10.0000 mL | INTRAVENOUS | Status: AC | PRN
  Administered 2020-01-16: 10 mL
  Filled 2020-01-16: qty 10

## 2020-01-16 MED ORDER — SODIUM CHLORIDE 0.9% IV SOLUTION
250.0000 mL | Freq: Once | INTRAVENOUS | Status: AC
  Administered 2020-01-16: 250 mL via INTRAVENOUS
  Filled 2020-01-16: qty 250

## 2020-01-16 MED ORDER — HEPARIN SOD (PORK) LOCK FLUSH 100 UNIT/ML IV SOLN
500.0000 [IU] | Freq: Every day | INTRAVENOUS | Status: AC | PRN
  Administered 2020-01-16: 500 [IU]
  Filled 2020-01-16: qty 5

## 2020-01-16 MED ORDER — ACETAMINOPHEN 325 MG PO TABS
ORAL_TABLET | ORAL | Status: AC
  Filled 2020-01-16: qty 2

## 2020-01-16 NOTE — Patient Instructions (Signed)

## 2020-01-16 NOTE — Progress Notes (Signed)
Per MD [name] pt can receive flu shot today and covid booster next week.  Scheduling message sent regarding covid shot appt.  Pt aware to bring white vaccine card.  Pt also aware to wear blue blood bracelet to tomorrow's appt.

## 2020-01-17 ENCOUNTER — Other Ambulatory Visit: Payer: Self-pay

## 2020-01-17 ENCOUNTER — Inpatient Hospital Stay: Payer: Medicare Other

## 2020-01-17 DIAGNOSIS — N1831 Chronic kidney disease, stage 3a: Secondary | ICD-10-CM | POA: Diagnosis not present

## 2020-01-17 DIAGNOSIS — Z23 Encounter for immunization: Secondary | ICD-10-CM | POA: Diagnosis not present

## 2020-01-17 DIAGNOSIS — E1165 Type 2 diabetes mellitus with hyperglycemia: Secondary | ICD-10-CM | POA: Diagnosis not present

## 2020-01-17 DIAGNOSIS — L02612 Cutaneous abscess of left foot: Secondary | ICD-10-CM | POA: Diagnosis not present

## 2020-01-17 DIAGNOSIS — E11621 Type 2 diabetes mellitus with foot ulcer: Secondary | ICD-10-CM | POA: Diagnosis not present

## 2020-01-17 DIAGNOSIS — J45909 Unspecified asthma, uncomplicated: Secondary | ICD-10-CM | POA: Diagnosis not present

## 2020-01-17 DIAGNOSIS — L97429 Non-pressure chronic ulcer of left heel and midfoot with unspecified severity: Secondary | ICD-10-CM | POA: Diagnosis not present

## 2020-01-17 DIAGNOSIS — R739 Hyperglycemia, unspecified: Secondary | ICD-10-CM | POA: Diagnosis not present

## 2020-01-17 DIAGNOSIS — C3491 Malignant neoplasm of unspecified part of right bronchus or lung: Secondary | ICD-10-CM | POA: Diagnosis not present

## 2020-01-17 DIAGNOSIS — Z9181 History of falling: Secondary | ICD-10-CM | POA: Diagnosis not present

## 2020-01-17 DIAGNOSIS — E1142 Type 2 diabetes mellitus with diabetic polyneuropathy: Secondary | ICD-10-CM | POA: Diagnosis not present

## 2020-01-17 DIAGNOSIS — G4733 Obstructive sleep apnea (adult) (pediatric): Secondary | ICD-10-CM | POA: Diagnosis not present

## 2020-01-17 DIAGNOSIS — Z87891 Personal history of nicotine dependence: Secondary | ICD-10-CM | POA: Diagnosis not present

## 2020-01-17 DIAGNOSIS — W010XXD Fall on same level from slipping, tripping and stumbling without subsequent striking against object, subsequent encounter: Secondary | ICD-10-CM | POA: Diagnosis not present

## 2020-01-17 DIAGNOSIS — D649 Anemia, unspecified: Secondary | ICD-10-CM

## 2020-01-17 DIAGNOSIS — Z794 Long term (current) use of insulin: Secondary | ICD-10-CM | POA: Diagnosis not present

## 2020-01-17 DIAGNOSIS — Z5189 Encounter for other specified aftercare: Secondary | ICD-10-CM | POA: Diagnosis not present

## 2020-01-17 DIAGNOSIS — R000 Tachycardia, unspecified: Secondary | ICD-10-CM | POA: Diagnosis not present

## 2020-01-17 DIAGNOSIS — C3431 Malignant neoplasm of lower lobe, right bronchus or lung: Secondary | ICD-10-CM | POA: Diagnosis not present

## 2020-01-17 DIAGNOSIS — S2232XD Fracture of one rib, left side, subsequent encounter for fracture with routine healing: Secondary | ICD-10-CM | POA: Diagnosis not present

## 2020-01-17 DIAGNOSIS — I129 Hypertensive chronic kidney disease with stage 1 through stage 4 chronic kidney disease, or unspecified chronic kidney disease: Secondary | ICD-10-CM | POA: Diagnosis not present

## 2020-01-17 DIAGNOSIS — Z5111 Encounter for antineoplastic chemotherapy: Secondary | ICD-10-CM | POA: Diagnosis not present

## 2020-01-17 DIAGNOSIS — E041 Nontoxic single thyroid nodule: Secondary | ICD-10-CM | POA: Diagnosis not present

## 2020-01-17 LAB — PREPARE RBC (CROSSMATCH)

## 2020-01-17 MED ORDER — DIPHENHYDRAMINE HCL 25 MG PO CAPS
25.0000 mg | ORAL_CAPSULE | Freq: Once | ORAL | Status: AC
  Administered 2020-01-17: 25 mg via ORAL

## 2020-01-17 MED ORDER — ACETAMINOPHEN 325 MG PO TABS
ORAL_TABLET | ORAL | Status: AC
  Filled 2020-01-17: qty 2

## 2020-01-17 MED ORDER — HEPARIN SOD (PORK) LOCK FLUSH 100 UNIT/ML IV SOLN
500.0000 [IU] | Freq: Every day | INTRAVENOUS | Status: AC | PRN
  Administered 2020-01-17: 500 [IU]
  Filled 2020-01-17: qty 5

## 2020-01-17 MED ORDER — DIPHENHYDRAMINE HCL 25 MG PO CAPS
ORAL_CAPSULE | ORAL | Status: AC
  Filled 2020-01-17: qty 1

## 2020-01-17 MED ORDER — ACETAMINOPHEN 325 MG PO TABS
650.0000 mg | ORAL_TABLET | Freq: Once | ORAL | Status: AC
  Administered 2020-01-17: 650 mg via ORAL

## 2020-01-17 MED ORDER — SODIUM CHLORIDE 0.9% FLUSH
10.0000 mL | INTRAVENOUS | Status: AC | PRN
  Administered 2020-01-17: 10 mL
  Filled 2020-01-17: qty 10

## 2020-01-17 MED ORDER — SODIUM CHLORIDE 0.9% IV SOLUTION
250.0000 mL | Freq: Once | INTRAVENOUS | Status: AC
  Administered 2020-01-17: 250 mL via INTRAVENOUS
  Filled 2020-01-17: qty 250

## 2020-01-17 NOTE — Patient Instructions (Signed)

## 2020-01-18 DIAGNOSIS — L97429 Non-pressure chronic ulcer of left heel and midfoot with unspecified severity: Secondary | ICD-10-CM | POA: Diagnosis not present

## 2020-01-18 DIAGNOSIS — E1142 Type 2 diabetes mellitus with diabetic polyneuropathy: Secondary | ICD-10-CM | POA: Diagnosis not present

## 2020-01-18 DIAGNOSIS — Z9181 History of falling: Secondary | ICD-10-CM | POA: Diagnosis not present

## 2020-01-18 DIAGNOSIS — G4733 Obstructive sleep apnea (adult) (pediatric): Secondary | ICD-10-CM | POA: Diagnosis not present

## 2020-01-18 DIAGNOSIS — E11621 Type 2 diabetes mellitus with foot ulcer: Secondary | ICD-10-CM | POA: Diagnosis not present

## 2020-01-18 DIAGNOSIS — S2232XD Fracture of one rib, left side, subsequent encounter for fracture with routine healing: Secondary | ICD-10-CM | POA: Diagnosis not present

## 2020-01-18 DIAGNOSIS — I129 Hypertensive chronic kidney disease with stage 1 through stage 4 chronic kidney disease, or unspecified chronic kidney disease: Secondary | ICD-10-CM | POA: Diagnosis not present

## 2020-01-18 DIAGNOSIS — Z87891 Personal history of nicotine dependence: Secondary | ICD-10-CM | POA: Diagnosis not present

## 2020-01-18 DIAGNOSIS — J45909 Unspecified asthma, uncomplicated: Secondary | ICD-10-CM | POA: Diagnosis not present

## 2020-01-18 DIAGNOSIS — W010XXD Fall on same level from slipping, tripping and stumbling without subsequent striking against object, subsequent encounter: Secondary | ICD-10-CM | POA: Diagnosis not present

## 2020-01-18 DIAGNOSIS — N1831 Chronic kidney disease, stage 3a: Secondary | ICD-10-CM | POA: Diagnosis not present

## 2020-01-18 DIAGNOSIS — Z794 Long term (current) use of insulin: Secondary | ICD-10-CM | POA: Diagnosis not present

## 2020-01-18 DIAGNOSIS — L02612 Cutaneous abscess of left foot: Secondary | ICD-10-CM | POA: Diagnosis not present

## 2020-01-18 DIAGNOSIS — C3491 Malignant neoplasm of unspecified part of right bronchus or lung: Secondary | ICD-10-CM | POA: Diagnosis not present

## 2020-01-18 DIAGNOSIS — E1165 Type 2 diabetes mellitus with hyperglycemia: Secondary | ICD-10-CM | POA: Diagnosis not present

## 2020-01-18 DIAGNOSIS — E041 Nontoxic single thyroid nodule: Secondary | ICD-10-CM | POA: Diagnosis not present

## 2020-01-18 DIAGNOSIS — R000 Tachycardia, unspecified: Secondary | ICD-10-CM | POA: Diagnosis not present

## 2020-01-18 LAB — BPAM RBC
Blood Product Expiration Date: 202110212359
Blood Product Expiration Date: 202110212359
ISSUE DATE / TIME: 202110060947
ISSUE DATE / TIME: 202110071024
Unit Type and Rh: 6200
Unit Type and Rh: 6200

## 2020-01-18 LAB — TYPE AND SCREEN
ABO/RH(D): A POS
Antibody Screen: NEGATIVE
Unit division: 0
Unit division: 0

## 2020-01-18 NOTE — Progress Notes (Signed)
[facility] OFFICE PROGRESS NOTE  [name], MD [place] [address]  DIAGNOSIS: Limited stage small cell lung cancer.He presented with a large right lower lobe/right hilar mass with obstruction of the right lower lobe bronchus. The patient also has associated bulky right lower paratracheal and subcarinal adenopathy.He was diagnosed in July 2021.  PRIOR THERAPY: 1)  Weekly concurrent chemoradiation with carboplatin for an AUC of 2 and paclitaxel 45 mg/m.First dose 10/29/2019. Status post 1 cycle. Discontinued due to change in diagnosis.  CURRENT THERAPY: Systemic chemotherapy with carboplatin for an AUC of 5 on day 1 and etoposide 100 mg per metered squared on days 1, 2, and 3 IV every 3 weeks with Neulasta support. First dose expected on 11/12/2019.Status post 3 cycles.Undergoing concurrent radiation under the care of Dr. Lisbeth Renshaw for the obstructive lung mass.  INTERVAL HISTORY: [name] 74 y.o. male returns to clinic today for a follow-up visit.  The patient is feeling fair today without any concerning complaints except for reflux. He has been taking Prilosec without significant relief. Otherwise,  he has been endorsing t diarrhea for several weeks.  The patient had cellulitis a few weeks ago and is status post treatment with antibiotics.  He states that his bowel habits have not been quite the same since that time.  He usually takes Imodium daily in the mornings with improvement in his diarrhea.  He denies any abdominal pain, fevers, or blood in the stool. The patient is followed by home health who performs wound care on his lower extremity wound.    He patient has been tolerating his treatment fair but he has been experiencing chemotherapy-induced anemia requiring blood transfusions, his most recent being on 01/17/2020 and 01/16/2020.  He denies any recent fever, chills, or night sweats. He denies any hemoptysis or chest pain but reports  his baseline mild cough.  He denies any shortness of breath.  He denies any nausea, vomiting, or constipation.   At his last appointment, the patient had been endorsing a dull headache almost daily for the last few weeks.  Dr. [name] did not believe that this was secondary to metastatic disease to the brain due to his recent brain MRI.  The patient states that his headaches have resolved at this time.  He denies any changes with gait, vision changes, or seizures.  The patient is here today for evaluation before starting cycle #4.  MEDICAL HISTORY: Past Medical History:  Diagnosis Date  . Allergic rhinitis due to pollen   . Cancer ([place])   . Chronic kidney disease, stage III (moderate) 12/19/2012  . Depression   . Diabetes mellitus ([place])   . Diverticulosis   . Erectile dysfunction associated with type 2 diabetes mellitus ([place]) 05/28/2015  . Former very heavy cigarette smoker (more than 40 per day) 10/07/2014  . GERD (gastroesophageal reflux disease)   . Gout   . Hyperlipidemia   . Hypertension   . Hypogonadism male 06/14/2012  . Iron deficiency   . Memory loss   . Osteoarthritis   . Personal history of colonic polyps    1996    ALLERGIES:  is allergic to tetracycline.  MEDICATIONS:  Current Outpatient Medications  Medication Sig Dispense Refill  . acetaminophen (QC ACETAMINOPHEN 8HR ARTH PAIN) 650 MG CR tablet Take 650 mg by mouth every 8 (eight) hours as needed for pain.    [name] Kitchen ampicillin (PRINCIPEN) 500 MG capsule Take 500 mg by mouth daily as needed (flare  up).     . Ascorbic Acid (VITAMIN C) 1000 MG tablet Take 1,000 mg by mouth daily.    [name] Kitchen aspirin EC 81 MG tablet Take 81 mg by mouth daily. Swallow whole.    [name] Kitchen atenolol (TENORMIN) 50 MG tablet Take 50 mg by mouth daily.    . cholecalciferol (VITAMIN D) 1000 UNITS tablet Take 2,000 Units by mouth daily.     . ciprofloxacin (CIPRO) 500 MG tablet Take 1 tablet (500 mg total) by mouth 2 (two) times daily. 4 tablet 0  . donepezil (ARICEPT)  10 MG tablet TAKE 1 TABLET BY MOUTH AT BEDTIME (Patient taking differently: Take 10 mg by mouth at bedtime. ) 90 tablet 3  . fluticasone (FLONASE) 50 MCG/ACT nasal spray PLACE 2 SPRAYS IN EACH NOSTRIL DAILY (Patient taking differently: Place 2 sprays into both nostrils daily as needed for allergies or rhinitis. ) 48 mL 3  . Hyprom-Naphaz-Polysorb-Zn Sulf (CLEAR EYES COMPLETE OP) Place 1 drop into both eyes daily as needed (dry eyes).    . Insulin Pen Needle (PEN NEEDLES) 31G X 5 MM MISC 1 each by Does not apply route 3 (three) times daily. To inject insulin E11.42 90 each 2  . insulin regular human CONCENTRATED (HUMULIN R U-500 KWIKPEN) 500 UNIT/ML kwikpen Inject under skin 80-90 units before b'fast, 60 units before lunch, 80-90 units vefore dinner (Patient taking differently: Inject 20-40 Units into the skin See admin instructions. Takes 35-40 units before breakfast, 20 units at lunch and 35-40 units at night) 6 pen 5  . lisinopril (ZESTRIL) 10 MG tablet Take 10 mg by mouth daily.    . memantine (NAMENDA) 10 MG tablet TAKE 1 TABLET BY MOUTH TWICE A DAY (Patient taking differently: Take 10 mg by mouth 2 (two) times daily. ) 180 tablet 3  . montelukast (SINGULAIR) 10 MG tablet Take 1 tablet (10 mg total) by mouth at bedtime. 30 tablet 3  . potassium chloride SA (KLOR-CON) 20 MEQ tablet Take 1 tablet (20 mEq total) by mouth daily. 7 tablet 0  . prochlorperazine (COMPAZINE) 10 MG tablet Take 1 tablet (10 mg total) by mouth every 6 (six) hours as needed. (Patient taking differently: Take 10 mg by mouth every 6 (six) hours as needed for nausea. ) 30 tablet 2  . simvastatin (ZOCOR) 40 MG tablet TAKE 1 TABLET BY MOUTH AT BEDTIME (Patient taking differently: Take 40 mg by mouth every evening. ) 90 tablet 2  . sucralfate (CARAFATE) 1 g tablet Take 1 tablet (1 g total) by mouth 4 (four) times daily. Dissolve each tablet in 15 cc water before use. 120 tablet 2  . venlafaxine XR (EFFEXOR-XR) 75 MG 24 hr capsule TAKE  1 CAPSULE BY MOUTH EVERY MORNING AND 2 CAPSULES AT BEDTIME 270 capsule 1  . pantoprazole (PROTONIX) 20 MG tablet Take 1 tablet (20 mg total) by mouth daily. 30 tablet 2   No current facility-administered medications for this visit.    SURGICAL HISTORY:  Past Surgical History:  Procedure Laterality Date  . BRONCHIAL NEEDLE ASPIRATION BIOPSY  10/17/2019   Procedure: BRONCHIAL NEEDLE ASPIRATION BIOPSIES;  Surgeon: Juanito Doom, MD;  Location: [facility] ENDOSCOPY;  Service: Cardiopulmonary;;  . CARDIAC CATHETERIZATION  11/2011   [facility]  . CHOLECYSTECTOMY  1980  . ENDOBRONCHIAL ULTRASOUND Bilateral 10/17/2019   Procedure: ENDOBRONCHIAL ULTRASOUND;  Surgeon: Juanito Doom, MD;  Location: [facility];  Service: Cardiopulmonary;  Laterality: Bilateral;  . ESOPHAGOGASTRODUODENOSCOPY (EGD) WITH PROPOFOL N/A 09/27/2017   Procedure: ESOPHAGOGASTRODUODENOSCOPY (EGD)  WITH PROPOFOL;  Surgeon: Lin Landsman, MD;  Location: [facility] ENDOSCOPY;  Service: Gastroenterology;  Laterality: N/A;  . EYE SURGERY Right 07/29/2016   cataract - Dr. Manuella Ghazi  . EYE SURGERY Left 08/23/2106   cataract - Dr. Manuella Ghazi  . HEMOSTASIS CONTROL  10/17/2019   Procedure: HEMOSTASIS CONTROL;  Surgeon: Juanito Doom, MD;  Location: [facility] ENDOSCOPY;  Service: Cardiopulmonary;;  . IR IMAGING GUIDED PORT INSERTION  11/30/2019  . IRRIGATION AND DEBRIDEMENT ABSCESS Left 11/23/2019   Procedure: IRRIGATION AND DEBRIDEMENT ABSCESS;  Surgeon: Melina Schools, MD;  Location: [facility] ORS;  Service: Orthopedics;  Laterality: Left;  . TOTAL HIP ARTHROPLASTY  2009   Dr. Gladstone Lighter  . TOTAL HIP ARTHROPLASTY      REVIEW OF SYSTEMS:   Review of Systems  Constitutional: Positive for fatigue and weight loss.  Negative for appetite change, chills, and fever. HENT:   Negative for mouth sores, nosebleeds, sore throat and trouble swallowing.  Eyes: Negative for eye problems and icterus.  Respiratory: Positive for mild baseline cough. negative for hemoptysis,  shortness of breath and wheezing.   Cardiovascular: Negative for chest pain and leg swelling.  Gastrointestinal: Positive for diarrhea. negative for abdominal pain, constipation,  nausea and vomiting.  Genitourinary: Negative for bladder incontinence, difficulty urinating, dysuria, frequency and hematuria.   Musculoskeletal: Negative for back pain, gait problem, neck pain and neck stiffness.  Skin: Negative for itching and rash.  Neurological: Negative for dizziness, extremity weakness, gait problem, headaches, light-headedness and seizures.  Hematological: Negative for adenopathy. Does not bruise/bleed easily.  Psychiatric/Behavioral: Negative for confusion, depression and sleep disturbance. The patient is not nervous/anxious.     PHYSICAL EXAMINATION:  Blood pressure 136/75, pulse 84, temperature 97.8 F (36.6 C), temperature source Tympanic, resp. rate 18, height 5\' 11"  (1.803 m), weight 184 lb 6.4 oz (83.6 kg), SpO2 98 %.  ECOG PERFORMANCE STATUS: 2 - Symptomatic, <50% confined to bed  Physical Exam  Constitutional: Oriented to person, place, and time and thin-appearing male and in no distress.  HENT:  Head: Normocephalic and atraumatic.  Mouth/Throat: Oropharynx is clear and moist. No oropharyngeal exudate.  Eyes: Conjunctivae are normal. Right eye exhibits no discharge. Left eye exhibits no discharge. No scleral icterus.  Neck: Normal range of motion. Neck supple.  Cardiovascular: Normal rate, regular rhythm, normal heart sounds and intact distal pulses.   Pulmonary/Chest: Effort normal and breath sounds normal. No respiratory distress. No wheezes. No rales.  Abdominal: Soft. Bowel sounds are normal. Exhibits no distension and no mass. There is no tenderness.  Musculoskeletal: Normal range of motion. Exhibits no edema.  Lymphadenopathy:    No cervical adenopathy.  Neurological: Alert and oriented to person, place, and time. Exhibits normal muscle tone.  The patient was examined in  the wheelchair. Skin: Skin is warm and dry. No rash noted. Not diaphoretic. No erythema. No pallor.  Psychiatric: Mood, memory and judgment normal.  Vitals reviewed.  LABORATORY DATA: Lab Results  Component Value Date   WBC 9.4 01/21/2020   HGB 10.6 (L) 01/21/2020   HCT 29.9 (L) 01/21/2020   MCV 84.7 01/21/2020   PLT 197 01/21/2020      Chemistry      Component Value Date/Time   NA 135 01/21/2020 1105   NA 130 (L) 11/25/2011 1119   NA 137 11/16/2011 0513   K 3.9 01/21/2020 1105   K 4.1 11/16/2011 0513   CL 97 (L) 01/21/2020 1105   CL 100 11/16/2011 0513   CO2 30 01/21/2020  1105   CO2 29 11/16/2011 0513   BUN 16 01/21/2020 1105   BUN 25 11/25/2011 1119   BUN 15 11/16/2011 0513   CREATININE 1.56 (H) 01/21/2020 1105   CREATININE 1.26 11/16/2011 0513      Component Value Date/Time   CALCIUM 9.4 01/21/2020 1105   CALCIUM 9.1 11/16/2011 0513   ALKPHOS 98 01/21/2020 1105   AST 9 (L) 01/21/2020 1105   ALT 9 01/21/2020 1105   BILITOT 0.5 01/21/2020 1105       RADIOGRAPHIC STUDIES:  CT Chest W Contrast  Result Date: 12/27/2019 CLINICAL DATA:  Primary Cancer Type: Small cell lung cancer, staging. Imaging Indication: Assess response to therapy Interval therapy since last imaging? Yes Initial Cancer Diagnosis Date: 10/17/2019; Established by: Biopsy-proven Detailed Pathology: Limited stage small cell lung cancer. Primary Tumor location: Right lower lobe/right hilar mass with obstruction of the right lower lobe bronchus. Surgeries: No Chemotherapy: Yes; Ongoing? Yes; Most recent administration: 12/11/2019 Immunotherapy? No Radiation therapy? Yes; Date Range: 10/18/2019 - 11/28/2019; Target: Right lung EXAM: CT CHEST WITH CONTRAST TECHNIQUE: Multidetector CT imaging of the chest was performed during intravenous contrast administration. CONTRAST:  44mL OMNIPAQUE IOHEXOL 300 MG/ML  SOLN COMPARISON:  CT chest 11/15/2019.  11/01/2019 PET-CT. FINDINGS: Cardiovascular: Normal heart size.  No pericardial effusion. Aortic atherosclerosis and coronary artery calcifications. Mediastinum/Nodes: 9 mm low-density nodule in right lobe of thyroid gland noted. Not clinically significant; no follow-up imaging recommended (ref: J Am Coll Radiol. 2015 Feb;12(2): 143-50). The trachea appears patent and is midline. Normal appearance of the esophagus. No axillary or supraclavicular adenopathy. Right paratracheal lymph node measures 2 cm, image 51/2. This is compared with 2.4 cm on 11/15/2019 and 2.8 cm on 10/11/2018 index subcarinal lymph node measures 1.5 cm, image 73/2. This is compared with 1.7 cm on 11/15/2019 and 2.2 cm on 10/11/2019. Lungs/Pleura: Mild changes of emphysema. The right perihilar lung mass is again identified, image 66/2. This measures 4.9 x 3.7 by 3.8 cm, image 90/5. On 10/11/2019 this measured 5.7 x 5.6 by 6.4 cm. Part solid nodule within the periphery of the right lower lobe measures 1.1 cm, image 108/7. This is compared with 1.3 cm previously. 3 mm nodule within the central aspect of the right upper lobe is stable, image 42/7. Upper Abdomen: Normal appearance of the adrenal glands. Previous cholecystectomy. No acute findings identified within the imaged portions of the upper abdomen. Musculoskeletal: No chest wall abnormality. No acute or significant osseous findings. IMPRESSION: 1. Interval response to therapy. The right perihilar lung mass has decreased in size in the interval. The enlarged right paratracheal and subcarinal lymph nodes are also decreased in size in the interval. 2. The sub solid nodule within the periphery of the right lower lobe is slightly decreased in size in the interval. 3. Coronary artery calcifications. Aortic Atherosclerosis (ICD10-I70.0) and Emphysema (ICD10-J43.9). Electronically Signed   By: Kerby Moors M.D.   On: 12/27/2019 10:02     ASSESSMENT/PLAN:  This is a very pleasant 74 year old Caucasian male diagnosed withlimited stage small cell lung  cancer.He presented with a large right lower lobe/right hilar mass with obstruction of the right lower lobe bronchus. The patient also has associated bulky right lower paratracheal and subcarinal adenopathy.He was diagnosed in July 2021.  The patienthad 1 cycle ofconcurrent chemoradiation with carboplatin for an AUC of 2 and paclitaxel 45 mg/m. He tolerated it wellwithout any concerning complaints. The patient undergoes radiotherapy under the care of Dr. Lisbeth Renshaw and Ebony Hail. This treatment was discontinued due  to change in his final pathology report.   He is currently undergoing systemic chemotherapy withcarboplatin for an AUC of 5 on day 1and etoposide 100 mg per metered squared on days 1, 2, and 3 IV every 3 weeks with Neulasta support. Given the patient's CKD, the patient is not a candidate for cisplatin.He is status post 3 cycles.    Labs chart reviewed. Reviewed his dose and treatment with Dr. [name]. Recommend the patient continue on the same treatment at the same dose.  He will receive cycle #4 today scheduled.  We will administer 8 units of insulin while in the clinic today for his hyperglycemia.  I reviewed the signs and symptoms of hypo and hyperglycemia with the patient.  He strongly encouraged to monitor his blood sugars closely at home.  He will continue to follow with wound care for his dressing changes.  I will add a sample to blood bank with his weekly lab work in case he needs a blood transfusion in the future.  Advised the patient to take Imodium if needed for diarrhea.  I will arrange for restaging CT scan of the chest prior to starting his next cycle of treatment.  I will order the scan without contrast due to his CKD.  I have discontinued the patient's Prilosec and have sent him a prescription for Protonix for his reflux.  He was also strongly encouraged to sit up 60 to 90 minutes after eating.  The patient was advised to call immediately if he has any  concerning symptoms in the interval. The patient voices understanding of current disease status and treatment options and is in agreement with the current care plan. All questions were answered. The patient knows to call the clinic with any problems, questions or concerns. We can certainly see the patient much sooner if necessary    Orders Placed This Encounter  Procedures  . CT Chest Wo Contrast    Standing Status:   Future    Standing Expiration Date:   01/20/2021    Order Specific Question:   Preferred imaging location?    Answer:   [place], [name] 01/21/20

## 2020-01-21 ENCOUNTER — Inpatient Hospital Stay: Payer: Medicare Other

## 2020-01-21 ENCOUNTER — Other Ambulatory Visit: Payer: Self-pay

## 2020-01-21 ENCOUNTER — Inpatient Hospital Stay (HOSPITAL_BASED_OUTPATIENT_CLINIC_OR_DEPARTMENT_OTHER): Payer: Medicare Other | Admitting: Physician Assistant

## 2020-01-21 VITALS — BP 136/75 | HR 84 | Temp 97.8°F | Resp 18 | Ht 71.0 in | Wt 184.4 lb

## 2020-01-21 DIAGNOSIS — W010XXD Fall on same level from slipping, tripping and stumbling without subsequent striking against object, subsequent encounter: Secondary | ICD-10-CM | POA: Diagnosis not present

## 2020-01-21 DIAGNOSIS — S2232XD Fracture of one rib, left side, subsequent encounter for fracture with routine healing: Secondary | ICD-10-CM | POA: Diagnosis not present

## 2020-01-21 DIAGNOSIS — Z5189 Encounter for other specified aftercare: Secondary | ICD-10-CM | POA: Diagnosis not present

## 2020-01-21 DIAGNOSIS — C3490 Malignant neoplasm of unspecified part of unspecified bronchus or lung: Secondary | ICD-10-CM

## 2020-01-21 DIAGNOSIS — Z5111 Encounter for antineoplastic chemotherapy: Secondary | ICD-10-CM

## 2020-01-21 DIAGNOSIS — L97429 Non-pressure chronic ulcer of left heel and midfoot with unspecified severity: Secondary | ICD-10-CM | POA: Diagnosis not present

## 2020-01-21 DIAGNOSIS — E11638 Type 2 diabetes mellitus with other oral complications: Secondary | ICD-10-CM

## 2020-01-21 DIAGNOSIS — C3491 Malignant neoplasm of unspecified part of right bronchus or lung: Secondary | ICD-10-CM | POA: Diagnosis not present

## 2020-01-21 DIAGNOSIS — R739 Hyperglycemia, unspecified: Secondary | ICD-10-CM | POA: Diagnosis not present

## 2020-01-21 DIAGNOSIS — Z87891 Personal history of nicotine dependence: Secondary | ICD-10-CM | POA: Diagnosis not present

## 2020-01-21 DIAGNOSIS — E041 Nontoxic single thyroid nodule: Secondary | ICD-10-CM | POA: Diagnosis not present

## 2020-01-21 DIAGNOSIS — L02612 Cutaneous abscess of left foot: Secondary | ICD-10-CM | POA: Diagnosis not present

## 2020-01-21 DIAGNOSIS — K219 Gastro-esophageal reflux disease without esophagitis: Secondary | ICD-10-CM

## 2020-01-21 DIAGNOSIS — Z794 Long term (current) use of insulin: Secondary | ICD-10-CM

## 2020-01-21 DIAGNOSIS — N1831 Chronic kidney disease, stage 3a: Secondary | ICD-10-CM | POA: Diagnosis not present

## 2020-01-21 DIAGNOSIS — G4733 Obstructive sleep apnea (adult) (pediatric): Secondary | ICD-10-CM | POA: Diagnosis not present

## 2020-01-21 DIAGNOSIS — R000 Tachycardia, unspecified: Secondary | ICD-10-CM | POA: Diagnosis not present

## 2020-01-21 DIAGNOSIS — I129 Hypertensive chronic kidney disease with stage 1 through stage 4 chronic kidney disease, or unspecified chronic kidney disease: Secondary | ICD-10-CM | POA: Diagnosis not present

## 2020-01-21 DIAGNOSIS — E11621 Type 2 diabetes mellitus with foot ulcer: Secondary | ICD-10-CM | POA: Diagnosis not present

## 2020-01-21 DIAGNOSIS — E1165 Type 2 diabetes mellitus with hyperglycemia: Secondary | ICD-10-CM | POA: Diagnosis not present

## 2020-01-21 DIAGNOSIS — Z23 Encounter for immunization: Secondary | ICD-10-CM | POA: Diagnosis not present

## 2020-01-21 DIAGNOSIS — Z9181 History of falling: Secondary | ICD-10-CM | POA: Diagnosis not present

## 2020-01-21 DIAGNOSIS — J45909 Unspecified asthma, uncomplicated: Secondary | ICD-10-CM | POA: Diagnosis not present

## 2020-01-21 DIAGNOSIS — E1142 Type 2 diabetes mellitus with diabetic polyneuropathy: Secondary | ICD-10-CM | POA: Diagnosis not present

## 2020-01-21 DIAGNOSIS — C3431 Malignant neoplasm of lower lobe, right bronchus or lung: Secondary | ICD-10-CM | POA: Diagnosis not present

## 2020-01-21 LAB — CMP (CANCER CENTER ONLY)
ALT: 9 U/L (ref 0–44)
AST: 9 U/L — ABNORMAL LOW (ref 15–41)
Albumin: 2.8 g/dL — ABNORMAL LOW (ref 3.5–5.0)
Alkaline Phosphatase: 98 U/L (ref 38–126)
Anion gap: 8 (ref 5–15)
BUN: 16 mg/dL (ref 8–23)
CO2: 30 mmol/L (ref 22–32)
Calcium: 9.4 mg/dL (ref 8.9–10.3)
Chloride: 97 mmol/L — ABNORMAL LOW (ref 98–111)
Creatinine: 1.56 mg/dL — ABNORMAL HIGH (ref 0.61–1.24)
GFR, Estimated: 43 mL/min — ABNORMAL LOW (ref >60)
Glucose, Bld: 330 mg/dL — ABNORMAL HIGH (ref 70–99)
Potassium: 3.9 mmol/L (ref 3.5–5.1)
Sodium: 135 mmol/L (ref 135–145)
Total Bilirubin: 0.5 mg/dL (ref 0.3–1.2)
Total Protein: 6.6 g/dL (ref 6.5–8.1)

## 2020-01-21 LAB — CBC WITH DIFFERENTIAL (CANCER CENTER ONLY)
Abs Immature Granulocytes: 0.07 10*3/uL (ref 0.00–0.07)
Basophils Absolute: 0 10*3/uL (ref 0.0–0.1)
Basophils Relative: 0 %
Eosinophils Absolute: 0 10*3/uL (ref 0.0–0.5)
Eosinophils Relative: 0 %
HCT: 29.9 % — ABNORMAL LOW (ref 39.0–52.0)
Hemoglobin: 10.6 g/dL — ABNORMAL LOW (ref 13.0–17.0)
Immature Granulocytes: 1 %
Lymphocytes Relative: 5 %
Lymphs Abs: 0.5 10*3/uL — ABNORMAL LOW (ref 0.7–4.0)
MCH: 30 pg (ref 26.0–34.0)
MCHC: 35.5 g/dL (ref 30.0–36.0)
MCV: 84.7 fL (ref 80.0–100.0)
Monocytes Absolute: 0.9 10*3/uL (ref 0.1–1.0)
Monocytes Relative: 9 %
Neutro Abs: 7.9 10*3/uL — ABNORMAL HIGH (ref 1.7–7.7)
Neutrophils Relative %: 85 %
Platelet Count: 197 10*3/uL (ref 150–400)
RBC: 3.53 MIL/uL — ABNORMAL LOW (ref 4.22–5.81)
RDW: 16 % — ABNORMAL HIGH (ref 11.5–15.5)
WBC Count: 9.4 10*3/uL (ref 4.0–10.5)
nRBC: 0 % (ref 0.0–0.2)

## 2020-01-21 LAB — SAMPLE TO BLOOD BANK

## 2020-01-21 MED ORDER — INSULIN ASPART 100 UNIT/ML ~~LOC~~ SOLN
SUBCUTANEOUS | Status: AC
  Filled 2020-01-21: qty 1

## 2020-01-21 MED ORDER — INSULIN ASPART 100 UNIT/ML ~~LOC~~ SOLN
8.0000 [IU] | Freq: Once | SUBCUTANEOUS | Status: AC
  Administered 2020-01-21: 8 [IU] via SUBCUTANEOUS

## 2020-01-21 MED ORDER — HEPARIN SOD (PORK) LOCK FLUSH 100 UNIT/ML IV SOLN
500.0000 [IU] | Freq: Once | INTRAVENOUS | Status: AC | PRN
  Administered 2020-01-21: 500 [IU]
  Filled 2020-01-21: qty 5

## 2020-01-21 MED ORDER — SODIUM CHLORIDE 0.9 % IV SOLN
10.0000 mg | Freq: Once | INTRAVENOUS | Status: AC
  Administered 2020-01-21: 10 mg via INTRAVENOUS
  Filled 2020-01-21: qty 10

## 2020-01-21 MED ORDER — SODIUM CHLORIDE 0.9 % IV SOLN
381.0000 mg | Freq: Once | INTRAVENOUS | Status: AC
  Administered 2020-01-21: 380 mg via INTRAVENOUS
  Filled 2020-01-21: qty 38

## 2020-01-21 MED ORDER — SODIUM CHLORIDE 0.9 % IV SOLN
150.0000 mg | Freq: Once | INTRAVENOUS | Status: AC
  Administered 2020-01-21: 150 mg via INTRAVENOUS
  Filled 2020-01-21: qty 150

## 2020-01-21 MED ORDER — PANTOPRAZOLE SODIUM 20 MG PO TBEC: 20.0000 mg | DELAYED_RELEASE_TABLET | Freq: Every day | ORAL | 2 refills | Status: AC

## 2020-01-21 MED ORDER — PALONOSETRON HCL INJECTION 0.25 MG/5ML
0.2500 mg | Freq: Once | INTRAVENOUS | Status: AC
  Administered 2020-01-21: 0.25 mg via INTRAVENOUS

## 2020-01-21 MED ORDER — SODIUM CHLORIDE 0.9% FLUSH
10.0000 mL | INTRAVENOUS | Status: DC | PRN
  Administered 2020-01-21: 10 mL
  Filled 2020-01-21: qty 10

## 2020-01-21 MED ORDER — SODIUM CHLORIDE 0.9 % IV SOLN
Freq: Once | INTRAVENOUS | Status: AC
  Filled 2020-01-21: qty 250

## 2020-01-21 MED ORDER — PALONOSETRON HCL INJECTION 0.25 MG/5ML
INTRAVENOUS | Status: AC
  Filled 2020-01-21: qty 5

## 2020-01-21 MED ORDER — SODIUM CHLORIDE 0.9 % IV SOLN
95.0000 mg/m2 | Freq: Once | INTRAVENOUS | Status: AC
  Administered 2020-01-21: 200 mg via INTRAVENOUS
  Filled 2020-01-21: qty 10

## 2020-01-21 NOTE — Progress Notes (Signed)
Per [name] Heiligoepter, PA, ok to treat with labs today.

## 2020-01-21 NOTE — Patient Instructions (Signed)
[facility] Discharge Instructions for Patients Receiving Chemotherapy  Today you received the following chemotherapy agents: Etoposide & Carboplatin  To help prevent nausea and vomiting after your treatment, we encourage you to take your nausea medication as directed.    If you develop nausea and vomiting that is not controlled by your nausea medication, call the clinic.   BELOW ARE SYMPTOMS THAT SHOULD BE REPORTED IMMEDIATELY:  *FEVER GREATER THAN 100.5 F  *CHILLS WITH OR WITHOUT FEVER  NAUSEA AND VOMITING THAT IS NOT CONTROLLED WITH YOUR NAUSEA MEDICATION  *UNUSUAL SHORTNESS OF BREATH  *UNUSUAL BRUISING OR BLEEDING  TENDERNESS IN MOUTH AND THROAT WITH OR WITHOUT PRESENCE OF ULCERS  *URINARY PROBLEMS  *BOWEL PROBLEMS  UNUSUAL RASH Items with * indicate a potential emergency and should be followed up as soon as possible.  Feel free to call the clinic should you have any questions or concerns. The clinic phone number is [phone].  Please show the [place] at check-in to the Emergency Department and triage nurse.

## 2020-01-22 ENCOUNTER — Other Ambulatory Visit: Payer: Self-pay

## 2020-01-22 ENCOUNTER — Inpatient Hospital Stay: Payer: Medicare Other

## 2020-01-22 VITALS — BP 132/76 | HR 83 | Temp 98.0°F | Resp 20

## 2020-01-22 DIAGNOSIS — C3431 Malignant neoplasm of lower lobe, right bronchus or lung: Secondary | ICD-10-CM | POA: Diagnosis not present

## 2020-01-22 DIAGNOSIS — Z5189 Encounter for other specified aftercare: Secondary | ICD-10-CM | POA: Diagnosis not present

## 2020-01-22 DIAGNOSIS — Z23 Encounter for immunization: Secondary | ICD-10-CM | POA: Diagnosis not present

## 2020-01-22 DIAGNOSIS — Z5111 Encounter for antineoplastic chemotherapy: Secondary | ICD-10-CM | POA: Diagnosis not present

## 2020-01-22 DIAGNOSIS — C3491 Malignant neoplasm of unspecified part of right bronchus or lung: Secondary | ICD-10-CM

## 2020-01-22 DIAGNOSIS — R739 Hyperglycemia, unspecified: Secondary | ICD-10-CM | POA: Diagnosis not present

## 2020-01-22 MED ORDER — SODIUM CHLORIDE 0.9 % IV SOLN
Freq: Once | INTRAVENOUS | Status: AC
  Filled 2020-01-22: qty 250

## 2020-01-22 MED ORDER — SODIUM CHLORIDE 0.9% FLUSH
10.0000 mL | INTRAVENOUS | Status: DC | PRN
  Administered 2020-01-22: 10 mL
  Filled 2020-01-22: qty 10

## 2020-01-22 MED ORDER — SODIUM CHLORIDE 0.9 % IV SOLN
97.0000 mg/m2 | Freq: Once | INTRAVENOUS | Status: AC
  Administered 2020-01-22: 200 mg via INTRAVENOUS
  Filled 2020-01-22: qty 10

## 2020-01-22 MED ORDER — SODIUM CHLORIDE 0.9 % IV SOLN
10.0000 mg | Freq: Once | INTRAVENOUS | Status: AC
  Administered 2020-01-22: 10 mg via INTRAVENOUS
  Filled 2020-01-22: qty 10

## 2020-01-22 MED ORDER — HEPARIN SOD (PORK) LOCK FLUSH 100 UNIT/ML IV SOLN
500.0000 [IU] | Freq: Once | INTRAVENOUS | Status: AC | PRN
  Administered 2020-01-22: 500 [IU]
  Filled 2020-01-22: qty 5

## 2020-01-22 NOTE — Patient Instructions (Signed)
[facility] Discharge Instructions for Patients Receiving Chemotherapy  Today you received the following chemotherapy agents: Etoposide (VEPESID).  To help prevent nausea and vomiting after your treatment, we encourage you to take your nausea medication as prescribed.   If you develop nausea and vomiting that is not controlled by your nausea medication, call the clinic.   BELOW ARE SYMPTOMS THAT SHOULD BE REPORTED IMMEDIATELY:  *FEVER GREATER THAN 100.5 F  *CHILLS WITH OR WITHOUT FEVER  NAUSEA AND VOMITING THAT IS NOT CONTROLLED WITH YOUR NAUSEA MEDICATION  *UNUSUAL SHORTNESS OF BREATH  *UNUSUAL BRUISING OR BLEEDING  TENDERNESS IN MOUTH AND THROAT WITH OR WITHOUT PRESENCE OF ULCERS  *URINARY PROBLEMS  *BOWEL PROBLEMS  UNUSUAL RASH Items with * indicate a potential emergency and should be followed up as soon as possible.  Feel free to call the clinic should you have any questions or concerns. The clinic phone number is [phone].  Please show the CHEMO ALERT CARD at check-in to the Emergency Department and triage nurse.   

## 2020-01-23 ENCOUNTER — Encounter: Payer: Medicare Other | Attending: Internal Medicine | Admitting: Internal Medicine

## 2020-01-23 ENCOUNTER — Other Ambulatory Visit: Payer: Self-pay

## 2020-01-23 ENCOUNTER — Inpatient Hospital Stay: Payer: Medicare Other

## 2020-01-23 VITALS — BP 152/63 | HR 84 | Temp 97.9°F | Resp 18

## 2020-01-23 DIAGNOSIS — L97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scle: Secondary | ICD-10-CM | POA: Diagnosis not present

## 2020-01-23 DIAGNOSIS — Z23 Encounter for immunization: Secondary | ICD-10-CM | POA: Diagnosis not present

## 2020-01-23 DIAGNOSIS — Z5111 Encounter for antineoplastic chemotherapy: Secondary | ICD-10-CM | POA: Diagnosis not present

## 2020-01-23 DIAGNOSIS — T8189XA Other complications of procedures, not elsewhere classified, initial encounter: Secondary | ICD-10-CM | POA: Diagnosis not present

## 2020-01-23 DIAGNOSIS — Z794 Long term (current) use of insulin: Secondary | ICD-10-CM | POA: Diagnosis not present

## 2020-01-23 DIAGNOSIS — Z5189 Encounter for other specified aftercare: Secondary | ICD-10-CM | POA: Diagnosis not present

## 2020-01-23 DIAGNOSIS — L97512 Non-pressure chronic ulcer of other part of right foot with fat layer exposed: Secondary | ICD-10-CM | POA: Diagnosis not present

## 2020-01-23 DIAGNOSIS — C3431 Malignant neoplasm of lower lobe, right bronchus or lung: Secondary | ICD-10-CM | POA: Diagnosis not present

## 2020-01-23 DIAGNOSIS — E11621 Type 2 diabetes mellitus with foot ulcer: Secondary | ICD-10-CM | POA: Diagnosis not present

## 2020-01-23 DIAGNOSIS — R739 Hyperglycemia, unspecified: Secondary | ICD-10-CM | POA: Diagnosis not present

## 2020-01-23 DIAGNOSIS — E1142 Type 2 diabetes mellitus with diabetic polyneuropathy: Secondary | ICD-10-CM | POA: Diagnosis not present

## 2020-01-23 DIAGNOSIS — C3491 Malignant neoplasm of unspecified part of right bronchus or lung: Secondary | ICD-10-CM

## 2020-01-23 MED ORDER — SODIUM CHLORIDE 0.9 % IV SOLN
97.0000 mg/m2 | Freq: Once | INTRAVENOUS | Status: AC
  Administered 2020-01-23: 200 mg via INTRAVENOUS
  Filled 2020-01-23: qty 10

## 2020-01-23 MED ORDER — SODIUM CHLORIDE 0.9 % IV SOLN
10.0000 mg | Freq: Once | INTRAVENOUS | Status: AC
  Administered 2020-01-23: 10 mg via INTRAVENOUS
  Filled 2020-01-23: qty 10

## 2020-01-23 MED ORDER — HEPARIN SOD (PORK) LOCK FLUSH 100 UNIT/ML IV SOLN
500.0000 [IU] | Freq: Once | INTRAVENOUS | Status: AC | PRN
  Administered 2020-01-23: 500 [IU]
  Filled 2020-01-23: qty 5

## 2020-01-23 MED ORDER — SODIUM CHLORIDE 0.9% FLUSH
10.0000 mL | INTRAVENOUS | Status: DC | PRN
  Administered 2020-01-23: 10 mL
  Filled 2020-01-23: qty 10

## 2020-01-23 MED ORDER — SODIUM CHLORIDE 0.9 % IV SOLN
Freq: Once | INTRAVENOUS | Status: AC
  Filled 2020-01-23: qty 250

## 2020-01-23 NOTE — Patient Instructions (Signed)
[facility] Discharge Instructions for Patients Receiving Chemotherapy  Today you received the following chemotherapy agents: Etoposide (VEPESID).  To help prevent nausea and vomiting after your treatment, we encourage you to take your nausea medication as prescribed.   If you develop nausea and vomiting that is not controlled by your nausea medication, call the clinic.   BELOW ARE SYMPTOMS THAT SHOULD BE REPORTED IMMEDIATELY:  *FEVER GREATER THAN 100.5 F  *CHILLS WITH OR WITHOUT FEVER  NAUSEA AND VOMITING THAT IS NOT CONTROLLED WITH YOUR NAUSEA MEDICATION  *UNUSUAL SHORTNESS OF BREATH  *UNUSUAL BRUISING OR BLEEDING  TENDERNESS IN MOUTH AND THROAT WITH OR WITHOUT PRESENCE OF ULCERS  *URINARY PROBLEMS  *BOWEL PROBLEMS  UNUSUAL RASH Items with * indicate a potential emergency and should be followed up as soon as possible.  Feel free to call the clinic should you have any questions or concerns. The clinic phone number is [phone].  Please show the CHEMO ALERT CARD at check-in to the Emergency Department and triage nurse.   

## 2020-01-23 NOTE — Progress Notes (Signed)
   Covid-19 Vaccination Clinic  Name:  JAQUON GINGERICH    [name]: [id number] DOB: 11-29-1945  01/23/2020  Mr. Newstrom was observed post Covid-19 immunization for 15 minutes without incident. He was provided with Vaccine Information Sheet and instruction to access the V-Safe system.   Mr. Lotz was instructed to call 911 with any severe reactions post vaccine: [name] Kitchen Difficulty breathing  . Swelling of face and throat  . A fast heartbeat  . A bad rash all over body  . Dizziness and weakness

## 2020-01-25 ENCOUNTER — Other Ambulatory Visit: Payer: Self-pay

## 2020-01-25 ENCOUNTER — Inpatient Hospital Stay: Payer: Medicare Other

## 2020-01-25 ENCOUNTER — Telehealth: Payer: Self-pay | Admitting: Physician Assistant

## 2020-01-25 VITALS — BP 155/80 | HR 99 | Resp 18

## 2020-01-25 DIAGNOSIS — Z23 Encounter for immunization: Secondary | ICD-10-CM | POA: Diagnosis not present

## 2020-01-25 DIAGNOSIS — C3491 Malignant neoplasm of unspecified part of right bronchus or lung: Secondary | ICD-10-CM

## 2020-01-25 DIAGNOSIS — C3431 Malignant neoplasm of lower lobe, right bronchus or lung: Secondary | ICD-10-CM | POA: Diagnosis not present

## 2020-01-25 DIAGNOSIS — R739 Hyperglycemia, unspecified: Secondary | ICD-10-CM | POA: Diagnosis not present

## 2020-01-25 DIAGNOSIS — Z5189 Encounter for other specified aftercare: Secondary | ICD-10-CM | POA: Diagnosis not present

## 2020-01-25 DIAGNOSIS — Z5111 Encounter for antineoplastic chemotherapy: Secondary | ICD-10-CM | POA: Diagnosis not present

## 2020-01-25 MED ORDER — PEGFILGRASTIM-CBQV 6 MG/0.6ML ~~LOC~~ SOSY
6.0000 mg | PREFILLED_SYRINGE | Freq: Once | SUBCUTANEOUS | Status: AC
  Administered 2020-01-25: 6 mg via SUBCUTANEOUS

## 2020-01-25 MED ORDER — PEGFILGRASTIM-CBQV 6 MG/0.6ML ~~LOC~~ SOSY
PREFILLED_SYRINGE | SUBCUTANEOUS | Status: AC
  Filled 2020-01-25: qty 0.6

## 2020-01-25 NOTE — Patient Instructions (Signed)

## 2020-01-25 NOTE — Telephone Encounter (Signed)
Called and spoke with patient. Confirmed all upcoming appts

## 2020-01-28 ENCOUNTER — Ambulatory Visit (INDEPENDENT_AMBULATORY_CARE_PROVIDER_SITE_OTHER): Payer: Medicare Other | Admitting: Internal Medicine

## 2020-01-28 ENCOUNTER — Inpatient Hospital Stay: Payer: Medicare Other

## 2020-01-28 ENCOUNTER — Other Ambulatory Visit: Payer: Self-pay

## 2020-01-28 ENCOUNTER — Encounter: Payer: Self-pay | Admitting: Internal Medicine

## 2020-01-28 VITALS — BP 130/80 | HR 89 | Ht 71.0 in | Wt 191.0 lb

## 2020-01-28 DIAGNOSIS — I129 Hypertensive chronic kidney disease with stage 1 through stage 4 chronic kidney disease, or unspecified chronic kidney disease: Secondary | ICD-10-CM | POA: Diagnosis not present

## 2020-01-28 DIAGNOSIS — C3431 Malignant neoplasm of lower lobe, right bronchus or lung: Secondary | ICD-10-CM | POA: Diagnosis not present

## 2020-01-28 DIAGNOSIS — E1142 Type 2 diabetes mellitus with diabetic polyneuropathy: Secondary | ICD-10-CM | POA: Diagnosis not present

## 2020-01-28 DIAGNOSIS — C3491 Malignant neoplasm of unspecified part of right bronchus or lung: Secondary | ICD-10-CM

## 2020-01-28 DIAGNOSIS — L02612 Cutaneous abscess of left foot: Secondary | ICD-10-CM | POA: Diagnosis not present

## 2020-01-28 DIAGNOSIS — R739 Hyperglycemia, unspecified: Secondary | ICD-10-CM | POA: Diagnosis not present

## 2020-01-28 DIAGNOSIS — J45909 Unspecified asthma, uncomplicated: Secondary | ICD-10-CM | POA: Diagnosis not present

## 2020-01-28 DIAGNOSIS — S2232XD Fracture of one rib, left side, subsequent encounter for fracture with routine healing: Secondary | ICD-10-CM | POA: Diagnosis not present

## 2020-01-28 DIAGNOSIS — IMO0002 Reserved for concepts with insufficient information to code with codable children: Secondary | ICD-10-CM

## 2020-01-28 DIAGNOSIS — E1165 Type 2 diabetes mellitus with hyperglycemia: Secondary | ICD-10-CM | POA: Diagnosis not present

## 2020-01-28 DIAGNOSIS — Z23 Encounter for immunization: Secondary | ICD-10-CM | POA: Diagnosis not present

## 2020-01-28 DIAGNOSIS — E781 Pure hyperglyceridemia: Secondary | ICD-10-CM | POA: Diagnosis not present

## 2020-01-28 DIAGNOSIS — L97429 Non-pressure chronic ulcer of left heel and midfoot with unspecified severity: Secondary | ICD-10-CM | POA: Diagnosis not present

## 2020-01-28 DIAGNOSIS — Z87891 Personal history of nicotine dependence: Secondary | ICD-10-CM | POA: Diagnosis not present

## 2020-01-28 DIAGNOSIS — E041 Nontoxic single thyroid nodule: Secondary | ICD-10-CM | POA: Diagnosis not present

## 2020-01-28 DIAGNOSIS — Z9181 History of falling: Secondary | ICD-10-CM | POA: Diagnosis not present

## 2020-01-28 DIAGNOSIS — Z5111 Encounter for antineoplastic chemotherapy: Secondary | ICD-10-CM | POA: Diagnosis not present

## 2020-01-28 DIAGNOSIS — C3490 Malignant neoplasm of unspecified part of unspecified bronchus or lung: Secondary | ICD-10-CM

## 2020-01-28 DIAGNOSIS — R000 Tachycardia, unspecified: Secondary | ICD-10-CM | POA: Diagnosis not present

## 2020-01-28 DIAGNOSIS — Z794 Long term (current) use of insulin: Secondary | ICD-10-CM | POA: Diagnosis not present

## 2020-01-28 DIAGNOSIS — G4733 Obstructive sleep apnea (adult) (pediatric): Secondary | ICD-10-CM | POA: Diagnosis not present

## 2020-01-28 DIAGNOSIS — N1831 Chronic kidney disease, stage 3a: Secondary | ICD-10-CM | POA: Diagnosis not present

## 2020-01-28 DIAGNOSIS — Z5189 Encounter for other specified aftercare: Secondary | ICD-10-CM | POA: Diagnosis not present

## 2020-01-28 DIAGNOSIS — E11621 Type 2 diabetes mellitus with foot ulcer: Secondary | ICD-10-CM | POA: Diagnosis not present

## 2020-01-28 DIAGNOSIS — W010XXD Fall on same level from slipping, tripping and stumbling without subsequent striking against object, subsequent encounter: Secondary | ICD-10-CM | POA: Diagnosis not present

## 2020-01-28 LAB — CBC WITH DIFFERENTIAL (CANCER CENTER ONLY)
Abs Immature Granulocytes: 0 10*3/uL (ref 0.00–0.07)
Band Neutrophils: 10 %
Basophils Absolute: 0 10*3/uL (ref 0.0–0.1)
Basophils Relative: 0 %
Eosinophils Absolute: 0 10*3/uL (ref 0.0–0.5)
Eosinophils Relative: 0 %
HCT: 25.3 % — ABNORMAL LOW (ref 39.0–52.0)
Hemoglobin: 8.6 g/dL — ABNORMAL LOW (ref 13.0–17.0)
Lymphocytes Relative: 4 %
Lymphs Abs: 0.5 10*3/uL — ABNORMAL LOW (ref 0.7–4.0)
MCH: 29.4 pg (ref 26.0–34.0)
MCHC: 34 g/dL (ref 30.0–36.0)
MCV: 86.3 fL (ref 80.0–100.0)
Monocytes Absolute: 0.2 10*3/uL (ref 0.1–1.0)
Monocytes Relative: 2 %
Neutro Abs: 11.1 10*3/uL — ABNORMAL HIGH (ref 1.7–7.7)
Neutrophils Relative %: 84 %
Platelet Count: 79 10*3/uL — ABNORMAL LOW (ref 150–400)
RBC: 2.93 MIL/uL — ABNORMAL LOW (ref 4.22–5.81)
RDW: 15.2 % (ref 11.5–15.5)
WBC Count: 11.8 10*3/uL — ABNORMAL HIGH (ref 4.0–10.5)
nRBC: 0 % (ref 0.0–0.2)

## 2020-01-28 LAB — CMP (CANCER CENTER ONLY)
ALT: 6 U/L (ref 0–44)
AST: 9 U/L — ABNORMAL LOW (ref 15–41)
Albumin: 2.6 g/dL — ABNORMAL LOW (ref 3.5–5.0)
Alkaline Phosphatase: 128 U/L — ABNORMAL HIGH (ref 38–126)
Anion gap: 8 (ref 5–15)
BUN: 22 mg/dL (ref 8–23)
CO2: 28 mmol/L (ref 22–32)
Calcium: 8.6 mg/dL — ABNORMAL LOW (ref 8.9–10.3)
Chloride: 98 mmol/L (ref 98–111)
Creatinine: 1.15 mg/dL (ref 0.61–1.24)
GFR, Estimated: >60 mL/min (ref >60)
Glucose, Bld: 339 mg/dL — ABNORMAL HIGH (ref 70–99)
Potassium: 3.8 mmol/L (ref 3.5–5.1)
Sodium: 134 mmol/L — ABNORMAL LOW (ref 135–145)
Total Bilirubin: 0.6 mg/dL (ref 0.3–1.2)
Total Protein: 6 g/dL — ABNORMAL LOW (ref 6.5–8.1)

## 2020-01-28 LAB — SAMPLE TO BLOOD BANK

## 2020-01-28 MED ORDER — HEPARIN SOD (PORK) LOCK FLUSH 100 UNIT/ML IV SOLN
500.0000 [IU] | Freq: Once | INTRAVENOUS | Status: AC | PRN
  Administered 2020-01-28: 500 [IU]
  Filled 2020-01-28: qty 5

## 2020-01-28 MED ORDER — SODIUM CHLORIDE 0.9% FLUSH
10.0000 mL | INTRAVENOUS | Status: DC | PRN
  Administered 2020-01-28: 10 mL
  Filled 2020-01-28: qty 10

## 2020-01-28 NOTE — Progress Notes (Signed)
Patient ID: KYM FENTER, male   DOB: 03-03-1946, 74 y.o.   [name]: [id number]  This visit occurred during the SARS-CoV-2 public health emergency.  Safety protocols were in place, including screening questions prior to the visit, additional usage of staff PPE, and extensive cleaning of exam room while observing appropriate contact time as indicated for disinfecting solutions.   HPI: [name] is a 74 y.o.-year-old male, presenting for f/u for DM2, dx 1997, insulin-dependent since 2010, uncontrolled, with complications (CKD stage 3, PN, ED, TIA, DR). Last visit 4 months ago.  Unfortunately, since last visit, he was diagnosed with small cell lung cancer.  He was previously admitted with hemoptysis and obstruction of the right lower lobe bronchus.  She started chemotherapy >> developed anemia.  He is more fatigued.  He had 2 syncopal episodes.  He was  in the emergency room in 11/2019 for a syncopal episode for one of them.  At that time, his blood pressure was low: 85/51.  Reviewed HbA1c levels: Lab Results  Component Value Date   HGBA1C 10.2 (H) 11/23/2019   HGBA1C 10.6 (H) 10/10/2019   HGBA1C 13.0 (A) 09/17/2019   Previously on:  U500 insulin: - draw up to 20 units on the syringe before b'fast (100 units) - draw up to 10 (not 12) units on the syringe before lunch (60 units) - draw up to 20 (sometimes 25) units on the syringe before dinner (100 units)  Then on: Type of insulin Breakfast Lunch Dinner Bedtime  Humulin R 20-25 >> 35-45  20-25 >> 35-45 May take 20-25  Humulin N 30 >> 35-45  25  >> 35-45 May take 20-25  Continues to miss insulin doses.  -Ozempic 0.5 mg weekly-started 04/2018 -no GI side effects - just restarted it in 04/2019 after stopping in 10/2018 2/2 being in the doughnut hole... >> stopped  2/2 price  He is currently on: -U500 insulin 50-40-50 >> 35-35-35 AFTER breakfast-lunch-dinner  Pt checks his sugars 4x a day: He has pain in his fingers from neuropathy  so he has difficulty checking blood sugars:  Freestyle libre CGM parameters: - Average: 272 - % active CGM time: 29% of the time - Glucose variability 36.4% (target < or = to 36%) - time in range:  - very low (<54): 0% - low (54-69):0% - normal range (70-180): 21% - high sugars (181-250): 23% - very high sugars (>250): 56%    Prev.: - am: 300s >> 130-140 >> 215-250 >> 420-430, HI (cantaloupe) - 2h after b'fast: n/c  - before lunch: 80, 114-289 >> 80s-460 >> 160 >> n/c  - 2h after lunch: n/c >> 300s >> n/c >>  240-260 - before dinner: 364 >> 198, 335 >> n/c - 2h after dinner: n/c - bedtime: 135-150 >> 200s-300 >> 370-450 - nighttime: 286 >> n/c >> 78 >> n/c >> 320-330 Lowest sugar was 72 >> 80s >> 78 >> 150 >> 220s >> 126.  he has hypoglycemia awareness at 100. Highest sugar was 460 >> 240 >> 400-500 >> HI >> 488.  Pt's meals are: - Breakfast: 3 pancakes with diet syrup >> oatmeal +  - Lunch: tuna or cheese sandwich + chips >> ham or [place] breast sandwiches without bread and pickles - Dinner: veggies + starch  - Snacks: crackers, cantaloupe/melon or other fruit - He stopped bread.  He lost ~60 lbs in last 2 years after the his wife's death.  -+ CKD, last BUN/creatinine:  Lab Results  Component Value Date  BUN 16 01/21/2020   CREATININE 1.56 (H) 01/21/2020  ACR on 12/12/2012.  On lisinopril. -+ HL; last set of lipids: Lab Results  Component Value Date   CHOL 172 01/03/2019   HDL 31.40 (L) 01/03/2019   LDLCALC 43 04/24/2009   LDLDIRECT 82.0 01/03/2019   TRIG (H) 01/03/2019    521.0 Triglyceride is over 400; calculations on Lipids are invalid.   CHOLHDL 5 01/03/2019  On Zocor 40. - last eye exam in 06-30-19: + DR OD; [facility].  He has a history of cataract surgeries.  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 -+ Numbness and tingling in his feet. + footdrop L.  He also had a foot ulcer, treated by wound care.  He has Alzheimer's dementia and is on  Wellbutrin, Namenda, Aricept.  Of note, he had a very low vitamin B12, under 200, in the past.  He is on B12 supplementation.  He has a history of a right thyroid nodule ~1.5 cm seen on CT chest 11/15/2019.  His TSH levels have been normal: Lab Results  Component Value Date   TSH 0.970 11/23/2019   TSH 1.42 10/10/2019   TSH 1.21 04/13/2018   TSH 1.480 06/29/2016   TSH 1.31 10/18/2014   TSH 0.55 08/26/2011   TSH 1.02 12/24/2009   TSH 0.87 Aug 28, 2009    His wife died in 29-Jun-2017 - PE.  ROS: Constitutional: + weight loss, + fatigue, no subjective hyperthermia, no subjective hypothermia Eyes: no blurry vision, no xerophthalmia ENT: no sore throat, no nodules palpated in neck, no dysphagia, no odynophagia, no hoarseness Cardiovascular: no CP/no SOB/no palpitations/no leg swelling Respiratory: no cough/no SOB/no wheezing Gastrointestinal: no N/no V/no D/no C/no acid reflux Musculoskeletal: no muscle aches/no joint aches Skin: no rashes, no hair loss Neurological: no tremors/+ numbness/+ tingling/no dizziness  I reviewed pt's medications, allergies, PMH, social hx, family hx, and changes were documented in the history of present illness. Otherwise, unchanged from my initial visit note.  Past Medical History:  Diagnosis Date  . Allergic rhinitis due to pollen   . Cancer ([place])   . Chronic kidney disease, stage III (moderate) 12/19/2012  . Depression   . Diabetes mellitus ([place])   . Diverticulosis   . Erectile dysfunction associated with type 2 diabetes mellitus ([place]) 05/28/2015  . Former very heavy cigarette smoker (more than 40 per day) 10/07/2014  . GERD (gastroesophageal reflux disease)   . Gout   . Hyperlipidemia   . Hypertension   . Hypogonadism male 06/14/2012  . Iron deficiency   . Memory loss   . Osteoarthritis   . Personal history of colonic polyps    1996   Past Surgical History:  Procedure Laterality Date  . BRONCHIAL NEEDLE ASPIRATION BIOPSY  10/17/2019   Procedure:  BRONCHIAL NEEDLE ASPIRATION BIOPSIES;  Surgeon: Juanito Doom, MD;  Location: [facility];  Service: Cardiopulmonary;;  . CARDIAC CATHETERIZATION  11/2011   [facility]  . CHOLECYSTECTOMY  1980  . ENDOBRONCHIAL ULTRASOUND Bilateral 10/17/2019   Procedure: ENDOBRONCHIAL ULTRASOUND;  Surgeon: Juanito Doom, MD;  Location: [facility];  Service: Cardiopulmonary;  Laterality: Bilateral;  . ESOPHAGOGASTRODUODENOSCOPY (EGD) WITH PROPOFOL N/A 09/27/2017   Procedure: ESOPHAGOGASTRODUODENOSCOPY (EGD) WITH PROPOFOL;  Surgeon: Lin Landsman, MD;  Location: [facility];  Service: Gastroenterology;  Laterality: N/A;  . EYE SURGERY Right 07/29/2016   cataract - Dr. Manuella Ghazi  . EYE SURGERY Left 08/23/2106   cataract - Dr. Manuella Ghazi  . HEMOSTASIS CONTROL  10/17/2019  Procedure: HEMOSTASIS CONTROL;  Surgeon: Juanito Doom, MD;  Location: [facility];  Service: Cardiopulmonary;;  . IR IMAGING GUIDED PORT INSERTION  11/30/2019  . IRRIGATION AND DEBRIDEMENT ABSCESS Left 11/23/2019   Procedure: IRRIGATION AND DEBRIDEMENT ABSCESS;  Surgeon: Melina Schools, MD;  Location: [facility];  Service: Orthopedics;  Laterality: Left;  . TOTAL HIP ARTHROPLASTY  2009   Dr. Gladstone Lighter  . TOTAL HIP ARTHROPLASTY     Social History   Socioeconomic History  . Marital status: Widowed    Spouse name: Not on file  . Number of children: 4  . Years of education: 12th  . Highest education level: Not on file  Occupational History  . Occupation: Counsellor  . Occupation: retired Magazine features editor: [employer]  Tobacco Use  . Smoking status: Former Smoker    Packs/day: 3.00    Years: 35.00    Pack years: 105.00    Types: Cigarettes    Quit date: 04/12/2000    Years since quitting: 19.8  . Smokeless tobacco: Never Used  Vaping Use  . Vaping Use: Never used  Substance and Sexual Activity  . Alcohol use: No  . Drug use: No  . Sexual activity: Never  Other Topics Concern  . Not on file  Social History Narrative    No regular exercise   Caffeine use: yes, dt coke   Lives at home with his mother lives with him.   Wife Fraser Din recently deceased   Right-handed.   Social Determinants of Health   Financial Resource Strain: Low Risk   . Difficulty of Paying Living Expenses: Not hard at all  Food Insecurity: No Food Insecurity  . Worried About Charity fundraiser in the Last Year: Never true  . Ran Out of Food in the Last Year: Never true  Transportation Needs: No Transportation Needs  . Lack of Transportation (Medical): No  . Lack of Transportation (Non-Medical): No  Physical Activity: Inactive  . Days of Exercise per Week: 0 days  . Minutes of Exercise per Session: 0 min  Stress: No Stress Concern Present  . Feeling of Stress : Not at all  Social Connections:   . Frequency of Communication with Friends and Family: Not on file  . Frequency of Social Gatherings with Friends and Family: Not on file  . Attends Religious Services: Not on file  . Active Member of Clubs or Organizations: Not on file  . Attends Archivist Meetings: Not on file  . Marital Status: Not on file  Intimate Partner Violence: Not At Risk  . Fear of Current or Ex-Partner: No  . Emotionally Abused: No  . Physically Abused: No  . Sexually Abused: No   Current Outpatient Medications on File Prior to Visit  Medication Sig Dispense Refill  . acetaminophen (QC ACETAMINOPHEN 8HR ARTH PAIN) 650 MG CR tablet Take 650 mg by mouth every 8 (eight) hours as needed for pain.    [name] Kitchen ampicillin (PRINCIPEN) 500 MG capsule Take 500 mg by mouth daily as needed (flare up).     . Ascorbic Acid (VITAMIN C) 1000 MG tablet Take 1,000 mg by mouth daily.    [name] Kitchen aspirin EC 81 MG tablet Take 81 mg by mouth daily. Swallow whole.    [name] Kitchen atenolol (TENORMIN) 50 MG tablet Take 50 mg by mouth daily.    . cholecalciferol (VITAMIN D) 1000 UNITS tablet Take 2,000 Units by mouth daily.     . ciprofloxacin (CIPRO) 500  MG tablet Take 1 tablet (500 mg total) by  mouth 2 (two) times daily. 4 tablet 0  . donepezil (ARICEPT) 10 MG tablet TAKE 1 TABLET BY MOUTH AT BEDTIME (Patient taking differently: Take 10 mg by mouth at bedtime. ) 90 tablet 3  . fluticasone (FLONASE) 50 MCG/ACT nasal spray PLACE 2 SPRAYS IN EACH NOSTRIL DAILY (Patient taking differently: Place 2 sprays into both nostrils daily as needed for allergies or rhinitis. ) 48 mL 3  . Hyprom-Naphaz-Polysorb-Zn Sulf (CLEAR EYES COMPLETE OP) Place 1 drop into both eyes daily as needed (dry eyes).    . Insulin Pen Needle (PEN NEEDLES) 31G X 5 MM MISC 1 each by Does not apply route 3 (three) times daily. To inject insulin E11.42 90 each 2  . insulin regular human CONCENTRATED (HUMULIN R U-500 KWIKPEN) 500 UNIT/ML kwikpen Inject under skin 80-90 units before b'fast, 60 units before lunch, 80-90 units vefore dinner (Patient taking differently: Inject 20-40 Units into the skin See admin instructions. Takes 35-40 units before breakfast, 20 units at lunch and 35-40 units at night) 6 pen 5  . lisinopril (ZESTRIL) 10 MG tablet Take 10 mg by mouth daily.    . memantine (NAMENDA) 10 MG tablet TAKE 1 TABLET BY MOUTH TWICE A DAY (Patient taking differently: Take 10 mg by mouth 2 (two) times daily. ) 180 tablet 3  . montelukast (SINGULAIR) 10 MG tablet Take 1 tablet (10 mg total) by mouth at bedtime. 30 tablet 3  . pantoprazole (PROTONIX) 20 MG tablet Take 1 tablet (20 mg total) by mouth daily. 30 tablet 2  . potassium chloride SA (KLOR-CON) 20 MEQ tablet Take 1 tablet (20 mEq total) by mouth daily. 7 tablet 0  . prochlorperazine (COMPAZINE) 10 MG tablet Take 1 tablet (10 mg total) by mouth every 6 (six) hours as needed. (Patient taking differently: Take 10 mg by mouth every 6 (six) hours as needed for nausea. ) 30 tablet 2  . simvastatin (ZOCOR) 40 MG tablet TAKE 1 TABLET BY MOUTH AT BEDTIME (Patient taking differently: Take 40 mg by mouth every evening. ) 90 tablet 2  . sucralfate (CARAFATE) 1 g tablet Take 1 tablet  (1 g total) by mouth 4 (four) times daily. Dissolve each tablet in 15 cc water before use. 120 tablet 2  . venlafaxine XR (EFFEXOR-XR) 75 MG 24 hr capsule TAKE 1 CAPSULE BY MOUTH EVERY MORNING AND 2 CAPSULES AT BEDTIME 270 capsule 1   No current facility-administered medications on file prior to visit.   Allergies  Allergen Reactions  . Tetracycline Itching and Rash   Family History  Problem Relation Age of Onset  . Diabetes Mother   . Alzheimer's disease Mother   . Diabetes Father   . Lung cancer Father        Age 79  . Stroke Father   . Heart attack Father   . Lung cancer Sister        lung  . Heart disease Maternal Grandfather   . COPD Sister   . Heart attack Sister   . Heart disease Sister     PE: BP 130/80   Pulse 89   Ht 5\' 11"  (1.803 m)   Wt 191 lb (86.6 kg)   SpO2 99%   BMI 26.64 kg/m  Body mass index is 26.64 kg/m. Wt Readings from Last 3 Encounters:  01/28/20 191 lb (86.6 kg)  01/21/20 184 lb 6.4 oz (83.6 kg)  12/31/19 185 lb 14.4 oz (84.3 kg)  Constitutional: overweight, in NAD Eyes: PERRLA, EOMI, no exophthalmos ENT: moist mucous membranes, no thyromegaly, no cervical lymphadenopathy Cardiovascular: RRR, No MRG Respiratory: CTA B Gastrointestinal: abdomen soft, NT, ND, BS+ Musculoskeletal: no deformities, strength intact in all 4 Skin: moist, warm, no rashes Neurological: no tremor with outstretched hands, DTR normal in all 4  ASSESSMENT: 1. DM2, insulin-dependent, uncontrolled, with complications - noncompliance with visits, insulin doses, CBG checks - CKD stage 3 - TIA - DR - PN - ED  2. HTG  3. R Thyroid nodule  PLAN:  1. Patient with history of longstanding, uncontrolled, type 2 diabetes, on basal-bolus insulin regimen at last visit, with NPH and regular insulin due to price.  At that time, he was still missing many insulin injections due to memory problems (he has Alzheimer's dementia).  Also, he could not check blood sugars frequently  due to neuropathy in fingers.  He could not obtain a CGM since this was not covered by his insurance in the past.  At last visit sugars were higher and his HbA1c was also very high, at 13%.  At that time, I suggested to stop dry foods and eating at night and also to try again to obtain Ozempic (he could not) and also to switch back to U500 insulin.  We did ask for help from [facility]  He was able to obtain U500 insulin since then.  Also, since last visit, he was able to obtain a CGM. CGM interpretation: -At today's visit, we did together his CGM tracings.  Only 21% of his blood sugars are at goal and 79% are above goal.  We discussed about the fact that we would aim for 70% of blood sugars at goal.  His average is 272, definitely higher than target, but his projected HbA1c from the last 2 weeks is actually slightly lower than the last one, at 9.8%.  Reviewing the trends, his sugars dropped abruptly overnight and then increase in a stepwise fashion throughout the day.  He does have occasional drops in blood sugars, although not to hypoglycemia values, after meals so he had to reduce the U500 insulin doses.  However, upon questioning, he is taking these after, rather than 30 minutes before meals, which is conducive to increased fluctuations in blood sugars.  I strongly advised him to move the doses of U500 insulin before meals and also to increase the dose.  Also, he is ready to for patient assistance for Ozempic -signed the forms today.  I am worried about weight loss for him, but we can definitely try Ozempic and see if we need to stop it in the future. -Since that sugars increase throughout the day, I advised him to increase the U500 insulin doses, but I also advised him to increase them further in the days that he has chemotherapy since he takes dexamethasone over, his sugars remain high after starting Ozempic. - I advised him to: Patient Instructions   Please increase: - U500 insulin 40-40-40 units 30 min  before B-L-D (may increase by 5 units if sugars are still high after starting Ozempic and also in the days of Chemotherapy if you take Dexamethasone)  Please start: - Ozempic 0.25 mg weekly in a.m. (for example on Sunday morning) x 4 weeks, then increase to 0.5 mg weekly in a.m. if no nausea or hypoglycemia.  Please return in 3 months.  - advised to check sugars at different times of the day - 4x a day, rotating check times - advised for yearly eye  exams >> he is UTD - return to clinic in 3 months  2. HTG -Reviewed latest lipid panel from 12/2018: LDL higher than goal of less than 70, triglycerides high, HDL low Lab Results  Component Value Date   CHOL 172 01/03/2019   HDL 31.40 (L) 01/03/2019   LDLCALC 43 04/24/2009   LDLDIRECT 82.0 01/03/2019   TRIG (H) 01/03/2019    521.0 Triglyceride is over 400; calculations on Lipids are invalid.   CHOLHDL 5 01/03/2019  -Continue Zocor 40 without side effects - has an appt with PCP next month  3.  R Thyroid nodule - new -Seen incidentally on CT chest from 11/15/2019 -Denies neck compression symptoms -TFTs have been normal -We will check a thyroid ultrasound after finishing ChTx -will address again at next visit  Philemon Kingdom, MD PhD [facility] Endocrinology

## 2020-01-28 NOTE — Progress Notes (Signed)
SOTA, HETZ ([id number]) Visit Report for 01/23/2020 Arrival Information Details Patient Name: [name], [name] Date of Service: 01/23/2020 11:00 AM Medical Record Number: [id number] Patient Account Number: [id number] Date of Birth/Sex: 07/15/1945 (74 y.o. M) Treating RN: Cornell Barman Primary Care Teondra Newburg: [name] Other Clinician: Referring Brenee Gajda: [name] Treating Arnie Maiolo/Extender: Tito Dine in Treatment: 7 Visit Information History Since Last Visit Added or deleted any medications: No Patient Arrived: Wheel Chair Any new allergies or adverse reactions: No Arrival Time: 10:53 Had a fall or experienced change in No Accompanied By: self activities of daily living that may affect Transfer Assistance: None risk of falls: Patient Identification Verified: Yes Signs or symptoms of abuse/neglect since last visito No Secondary Verification Process Completed: Yes Hospitalized since last visit: No Patient Requires Transmission-Based Precautions: No Implantable device outside of the clinic excluding No cellular tissue based products placed in the center since last visit: Has Dressing in Place as Prescribed: Yes Pain Present Now: No Electronic Signature(s) Signed: 01/23/2020 4:20:28 PM By: Lorine Bears RCP, RRT, CHT Entered By: Lorine Bears on 01/23/2020 10:53:30 [name] ([id number]) -------------------------------------------------------------------------------- Clinic Level of Care Assessment Details Patient Name: [name] Date of Service: 01/23/2020 11:00 AM Medical Record Number: [id number] Patient Account Number: [id number] Date of Birth/Sex: 08-14-1945 (74 y.o. M) Treating RN: Cornell Barman Primary Care Joshual Terrio: [name] Other Clinician: Referring Janis Sol: [name] Treating Stefannie Defeo/Extender: Tito Dine in Treatment: 7 Clinic Level of Care Assessment Items TOOL 4  Quantity Score []  - Use when only an EandM is performed on FOLLOW-UP visit 0 ASSESSMENTS - Nursing Assessment / Reassessment X - Reassessment of Co-morbidities (includes updates in patient status) 1 10 X- 1 5 Reassessment of Adherence to Treatment Plan ASSESSMENTS - Wound and Skin Assessment / Reassessment X - Simple Wound Assessment / Reassessment - one wound 1 5 []  - 0 Complex Wound Assessment / Reassessment - multiple wounds []  - 0 Dermatologic / Skin Assessment (not related to wound area) ASSESSMENTS - Focused Assessment []  - Circumferential Edema Measurements - multi extremities 0 []  - 0 Nutritional Assessment / Counseling / Intervention []  - 0 Lower Extremity Assessment (monofilament, tuning fork, pulses) []  - 0 Peripheral Arterial Disease Assessment (using hand held doppler) ASSESSMENTS - Ostomy and/or Continence Assessment and Care []  - Incontinence Assessment and Management 0 []  - 0 Ostomy Care Assessment and Management (repouching, etc.) PROCESS - Coordination of Care X - Simple Patient / Family Education for ongoing care 1 15 []  - 0 Complex (extensive) Patient / Family Education for ongoing care X- 1 10 Staff obtains Programmer, systems, Records, Test Results / Process Orders []  - 0 Staff telephones HHA, Nursing Homes / Clarify orders / etc []  - 0 Routine Transfer to another Facility (non-emergent condition) []  - 0 Routine Hospital Admission (non-emergent condition) []  - 0 New Admissions / Biomedical engineer / Ordering NPWT, Apligraf, etc. []  - 0 Emergency Hospital Admission (emergent condition) X- 1 10 Simple Discharge Coordination []  - 0 Complex (extensive) Discharge Coordination PROCESS - Special Needs []  - Pediatric / Minor Patient Management 0 []  - 0 Isolation Patient Management []  - 0 Hearing / Language / Visual special needs []  - 0 Assessment of Community assistance (transportation, D/C planning, etc.) []  - 0 Additional assistance / Altered  mentation []  - 0 Support Surface(s) Assessment (bed, cushion, seat, etc.) INTERVENTIONS - Wound Cleansing / Measurement John, Natan E. ([id number]) X- 1 5 Simple Wound Cleansing - one wound []  - 0 Complex  Wound Cleansing - multiple wounds X- 1 5 Wound Imaging (photographs - any number of wounds) []  - 0 Wound Tracing (instead of photographs) X- 1 5 Simple Wound Measurement - one wound []  - 0 Complex Wound Measurement - multiple wounds INTERVENTIONS - Wound Dressings []  - Small Wound Dressing one or multiple wounds 0 X- 1 15 Medium Wound Dressing one or multiple wounds []  - 0 Large Wound Dressing one or multiple wounds []  - 0 Application of Medications - topical []  - 0 Application of Medications - injection INTERVENTIONS - Miscellaneous []  - External ear exam 0 []  - 0 Specimen Collection (cultures, biopsies, blood, body fluids, etc.) []  - 0 Specimen(s) / Culture(s) sent or taken to Lab for analysis []  - 0 Patient Transfer (multiple staff / Civil Service fast streamer / Similar devices) []  - 0 Simple Staple / Suture removal (25 or less) []  - 0 Complex Staple / Suture removal (26 or more) []  - 0 Hypo / Hyperglycemic Management (close monitor of Blood Glucose) []  - 0 Ankle / Brachial Index (ABI) - do not check if billed separately X- 1 5 Vital Signs Has the patient been seen at the hospital within the last three years: Yes Total Score: 90 Level Of Care: New/Established - Level 3 Electronic Signature(s) Signed: 01/28/2020 10:00:34 AM By: Gretta Cool, BSN, RN, CWS, Kim RN, BSN Entered By: Gretta Cool, BSN, RN, CWS, Kim on 01/23/2020 11:15:55 [name] ([id number]) -------------------------------------------------------------------------------- Encounter Discharge Information Details Patient Name: [name] Date of Service: 01/23/2020 11:00 AM Medical Record Number: [id number] Patient Account Number: [id number] Date of Birth/Sex: 01-08-1946 (74 y.o. M) Treating RN: Cornell Barman Primary Care Hadleigh Felber: [name] Other Clinician: Referring Seleni Meller: [name] Treating Antoine Fiallos/Extender: Tito Dine in Treatment: 7 Encounter Discharge Information Items Discharge Condition: Stable Ambulatory Status: Wheelchair Discharge Destination: Home Transportation: Private Auto Accompanied By: self Schedule Follow-up Appointment: Yes Clinical Summary of Care: Electronic Signature(s) Signed: 01/28/2020 10:00:34 AM By: Gretta Cool, BSN, RN, CWS, Kim RN, BSN Entered By: Gretta Cool, BSN, RN, CWS, Kim on 01/23/2020 11:14:59 [name] ([id number]) -------------------------------------------------------------------------------- Lower Extremity Assessment Details Patient Name: [name] Date of Service: 01/23/2020 11:00 AM Medical Record Number: [id number] Patient Account Number: [id number] Date of Birth/Sex: 18-Jun-1945 (74 y.o. M) Treating RN: Army Melia Primary Care Melis Trochez: [name] Other Clinician: Referring Deisha Stull: [name] Treating Arjun Hard/Extender: Ricard Dillon Weeks in Treatment: 7 Edema Assessment Assessed: [Left: No] [Right: No] Edema: [Left: N] [Right: o] Vascular Assessment Pulses: Dorsalis Pedis Palpable: [Left:Yes] Electronic Signature(s) Signed: 01/23/2020 2:40:35 PM By: Army Melia Entered By: Army Melia on 01/23/2020 11:03:17 [name] ([id number]) -------------------------------------------------------------------------------- Multi Wound Chart Details Patient Name: [name] Date of Service: 01/23/2020 11:00 AM Medical Record Number: [id number] Patient Account Number: [id number] Date of Birth/Sex: 26-Apr-1945 (74 y.o. M) Treating RN: Cornell Barman Primary Care Cianni Manny: [name] Other Clinician: Referring Jaishaun Mcnab: [name] Treating Abbey Veith/Extender: Tito Dine in Treatment: 7 Vital Signs Height(in): 71 Pulse(bpm): 85 Weight(lbs): 190 Blood  Pressure(mmHg): 200/95 Body Mass Index(BMI): 26 Temperature(F): 97.8 Respiratory Rate(breaths/min): 16 Photos: [N/A:N/A] Wound Location: Left, Lateral, Plantar Foot N/A N/A Wounding Event: Surgical Injury N/A N/A Primary Etiology: Diabetic Wound/Ulcer of the Lower N/A N/A Extremity Comorbid History: Cataracts, Middle ear problems, N/A N/A Sleep Apnea, Hypertension, Type II Diabetes, Gout, Osteoarthritis, Neuropathy, Received Chemotherapy Date Acquired: 11/23/2019 N/A N/A Weeks of Treatment: 7 N/A N/A Wound Status: Open N/A N/A Measurements L x W x D (cm) 1.6x1.5x0.2 N/A N/A Area (cm) : 1.885  N/A N/A Volume (cm) : 0.377 N/A N/A % Reduction in Area: 70.40% N/A N/A % Reduction in Volume: 88.10% N/A N/A Classification: Grade 3 N/A N/A Exudate Amount: Medium N/A N/A Exudate Type: Serosanguineous N/A N/A Exudate Color: red, brown N/A N/A Wound Margin: Flat and Intact N/A N/A Granulation Amount: Small (1-33%) N/A N/A Granulation Quality: Pink N/A N/A Necrotic Amount: Large (67-100%) N/A N/A Exposed Structures: Fat Layer (Subcutaneous Tissue): N/A N/A Yes Fascia: No Tendon: No Muscle: No Joint: No Bone: No Epithelialization: Small (1-33%) N/A N/A Treatment Notes Wound #7 (Left, Lateral, Plantar Foot) Notes betadine to left great toe and leave open to air. Hblue to left lateral plantar foot, bordered foam dressing [name], [name] ([id number]) Electronic Signature(s) Signed: 01/23/2020 4:31:41 PM By: Linton Ham MD Entered By: Linton Ham on 01/23/2020 [place], [place]. ([id number]) -------------------------------------------------------------------------------- Multi-Disciplinary Care Plan Details Patient Name: [name] Date of Service: 01/23/2020 11:00 AM Medical Record Number: [id number] Patient Account Number: [id number] Date of Birth/Sex: Aug 19, 1945 (74 y.o. M) Treating RN: Cornell Barman Primary Care Henchy Mccauley: [name] Other  Clinician: Referring Phynix Horton: [name] Treating Essa Wenk/Extender: Tito Dine in Treatment: 7 Active Inactive Necrotic Tissue Nursing Diagnoses: Impaired tissue integrity related to necrotic/devitalized tissue Goals: Necrotic/devitalized tissue will be minimized in the wound bed Date Initiated: 12/03/2019 Target Resolution Date: 12/07/2019 Goal Status: Active Interventions: Assess patient pain level pre-, during and post procedure and prior to discharge Treatment Activities: Apply topical anesthetic as ordered : 12/03/2019 Notes: Orientation to the Wound Care Program Nursing Diagnoses: Knowledge deficit related to the wound healing center program Goals: Patient/caregiver will verbalize understanding of the [place] Date Initiated: 12/03/2019 Target Resolution Date: 12/07/2019 Goal Status: Active Interventions: Provide education on orientation to the wound center Notes: Pain, Acute or Chronic Nursing Diagnoses: Pain Management - Cyclic Acute (Dressing Change Related) Goals: Patient will verbalize adequate pain control and receive pain control interventions during procedures as needed Date Initiated: 12/03/2019 Target Resolution Date: 12/07/2019 Goal Status: Active Interventions: Assess comfort goal upon admission Treatment Activities: Administer pain control measures as ordered : 12/03/2019 Notes: Wound/Skin Impairment Nursing Diagnoses: Impaired tissue integrity [name], [name] ([id number]) Goals: Ulcer/skin breakdown will have a volume reduction of 30% by week 4 Date Initiated: 12/03/2019 Target Resolution Date: 01/03/2020 Goal Status: Active Ulcer/skin breakdown will have a volume reduction of 50% by week 8 Date Initiated: 12/03/2019 Target Resolution Date: 02/02/2020 Goal Status: Active Ulcer/skin breakdown will have a volume reduction of 80% by week 12 Date Initiated: 12/03/2019 Target Resolution Date: 03/04/2020 Goal  Status: Active Interventions: Assess ulceration(s) every visit Treatment Activities: Referred to DME Prue Lingenfelter for dressing supplies : 12/03/2019 Topical wound management initiated : 12/03/2019 Notes: Electronic Signature(s) Signed: 01/28/2020 10:00:34 AM By: Gretta Cool, BSN, RN, CWS, Kim RN, BSN Entered By: Gretta Cool, BSN, RN, CWS, Kim on 01/23/2020 11:08:37 [name] ([id number]) -------------------------------------------------------------------------------- Pain Assessment Details Patient Name: [name] Date of Service: 01/23/2020 11:00 AM Medical Record Number: [id number] Patient Account Number: [id number] Date of Birth/Sex: 1945/11/07 (74 y.o. M) Treating RN: Army Melia Primary Care Maddox Hlavaty: [name] Other Clinician: Referring Lorice Lafave: [name] Treating Kirin Brandenburger/Extender: Tito Dine in Treatment: 7 Active Problems Location of Pain Severity and Description of Pain Patient Has Paino No Site Locations Pain Management and Medication Current Pain Management: Electronic Signature(s) Signed: 01/23/2020 2:40:35 PM By: Army Melia Entered By: Army Melia on 01/23/2020 11:01:42 [name] ([id number]) -------------------------------------------------------------------------------- Wound Assessment Details Patient Name: [name] Date of  Service: 01/23/2020 11:00 AM Medical Record Number: [id number] Patient Account Number: [id number] Date of Birth/Sex: Apr 07, 1946 (74 y.o. M) Treating RN: Army Melia Primary Care Kassadee Carawan: [name] Other Clinician: Referring Yeudiel Mateo: [name] Treating Haille Pardi/Extender: Tito Dine in Treatment: 7 Wound Status Wound Number: 7 Primary Diabetic Wound/Ulcer of the Lower Extremity Etiology: Wound Location: Left, Lateral, Plantar Foot Wound Open Wounding Event: Surgical Injury Status: Date Acquired: 11/23/2019 Comorbid Cataracts, Middle ear problems, Sleep  Apnea, Weeks Of Treatment: 7 History: Hypertension, Type II Diabetes, Gout, Osteoarthritis, Clustered Wound: No Neuropathy, Received Chemotherapy Photos Wound Measurements Length: (cm) 1.6 Width: (cm) 1.5 Depth: (cm) 0.2 Area: (cm) 1.885 Volume: (cm) 0.377 % Reduction in Area: 70.4% % Reduction in Volume: 88.1% Epithelialization: Small (1-33%) Wound Description Classification: Grade 3 Wound Margin: Flat and Intact Exudate Amount: Medium Exudate Type: Serosanguineous Exudate Color: red, brown Foul Odor After Cleansing: No Slough/Fibrino Yes Wound Bed Granulation Amount: Small (1-33%) Exposed Structure Granulation Quality: Pink Fascia Exposed: No Necrotic Amount: Large (67-100%) Fat Layer (Subcutaneous Tissue) Exposed: Yes Necrotic Quality: Adherent Slough Tendon Exposed: No Muscle Exposed: No Joint Exposed: No Bone Exposed: No Treatment Notes Wound #7 (Left, Lateral, Plantar Foot) Notes betadine to left great toe and leave open to air. Hblue to left lateral plantar foot, bordered foam dressing Electronic Signature(s) [name], [name] ([id number]) Signed: 01/23/2020 2:40:35 PM By: Army Melia Entered By: Army Melia on 01/23/2020 11:02:09 [name] ([id number]) -------------------------------------------------------------------------------- [place] Details Patient Name: [name] Date of Service: 01/23/2020 11:00 AM Medical Record Number: [id number] Patient Account Number: [id number] Date of Birth/Sex: 08-29-1945 (74 y.o. M) Treating RN: Cornell Barman Primary Care Caeleigh Prohaska: [name] Other Clinician: Referring Wilfrid Hyser: [name] Treating Saryn Cherry/Extender: Tito Dine in Treatment: 7 Vital Signs Time Taken: 10:53 Temperature (F): 97.8 Height (in): 71 Pulse (bpm): 85 Weight (lbs): 190 Respiratory Rate (breaths/min): 16 Body Mass Index (BMI): 26.5 Blood Pressure (mmHg): 200/95 Reference Range: 80 - 120 mg /  dl Electronic Signature(s) Signed: 01/23/2020 4:20:28 PM By: Lorine Bears RCP, RRT, CHT Entered By: Becky Sax, Amado Nash on 01/23/2020 10:56:52

## 2020-01-28 NOTE — Patient Instructions (Addendum)
  Please increase: - U500 insulin 40-40-40 units 30 min before B-L-D (may increase by 5 units if sugars are still high after starting Ozempic and also in the days of Chemotherapy if you take Dexamethasone)  Please start: - Ozempic 0.25 mg weekly in a.m. (for example on Sunday morning) x 4 weeks, then increase to 0.5 mg weekly in a.m. if no nausea or hypoglycemia.  Please return in 3 months.

## 2020-01-28 NOTE — Progress Notes (Signed)
DONNIS, PHANEUF ([id number]) Visit Report for 01/23/2020 HPI Details Patient Name: [name], [name] Date of Service: 01/23/2020 11:00 AM Medical Record Number: [id number] Patient Account Number: [id number] Date of Birth/Sex: 12/05/1945 (74 y.o. M) Treating RN: Cornell Barman Primary Care Provider: [name] Other Clinician: Referring Provider: [name] Treating Provider/Extender: Tito Dine in Treatment: 7 History of Present Illness HPI Description: 10/04/16 on evaluation today patient presents for initial evaluation concerning a laceration of the right wrist and hand on the bowler aspect. He tells me that he fell into glass following a medication change in regard to his diabetes. He initially went to [facility] where she was sutured and subsequently his daughter who is a Marine scientist removed the teachers at the appropriate time. Unfortunately the wound has done poorly and dehissed which has caused him some trouble including infection. He has been on antibiotics both topically and orally though the wound continues to show signs of necrotic tissue according to notes which is what he was sent to [place] for evaluation concerning. He does have diabetes and unfortunately this is severely uncontrolled. He does see an endocrinologist and is working on this. 10/11/2016 -- the patient's notes from the ER were reviewed and he had his suturing done on 09/17/2016 and had necrotic skin flaps which had to be trimmed the last visit he was here. He has managed to get his Santyl ointment and has been using this appropriately. 10/21/16 patient's wound appears to be doing very well on evaluation today. He still has some Slough covering the wound bed but this is nowhere near as severe as when i saw this initially two weeks ago nor even my review of his pictures last week. I'm pleased with how this is progressing. Patient's wound over the right form appears to be doing fairly well on evaluation  today. There is no evidence of infection and he has a small area at the crease of where she is wrist extends and flexes that is still open and appears to be having difficulty due to the fact that it is cracking open. I think this is something that may be controlled with moisture control that is keeping the wound a little bit more moist. Nonetheless this is a tiny region compared to the initial wound and overall appears to be doing very well. No fevers, chills, nausea, or vomiting noted at this time. 11/25/16 on evaluation today patient appears to be doing well in regard to his right wrist ulcer. In fact this appears to be completely healed he is having no discomfort. He is pleased with how this is progressed. Readmission: 06/24/17 on evaluation today patient appears back in our clinic regarding issues that he has been having with his right ankle on the lateral malleolus as well is in the dorsal surface of his left foot. Both have been going on for several weeks unfortunately. He tells me that initially this was being managed by Dr. Edilia Bo and I did review the notes from Dr. Edilia Bo in epic. Patient was placed on amoxicillin initially and then subsequently Keflex following. He has been using triple antibiotic ointment over-the-counter along with a Band-Aid for the wound currently. He did not have any Santyl remaining. His most recent hemoglobin A1c which was just two days ago was 10.3. Fortunately he seems to have excellent blood flow in the bilateral lower extremities with his left ABI being 0.95 in the right ABI 1.01. Patient does have some pain in regard to each of these ulcerations  but he tells me it's not nearly as severe as the hand wound that I help treat earlier over the summer. This was in 2018. 07/15/17 on evaluation today patient's lower surety ulcer is actually both appear to be doing fairly well at this point. He does have a little bit of skin overlapping the ulcer on the right lateral  ankle this was debrided away today just with saline and gauze without complication patient had no discomfort or pain. Otherwise patient's left foot ulcer does appear to be showing signs of improvement he does seem to have a right great toe. Nikki and noted at this point in time. I think that this does need to be managed at this point. No fevers, chills, nausea, or vomiting noted at this time. 07/22/17 on evaluation today patient's wounds in regard to his lower extremities appear to be doing somewhat better. His right ankle wound in fact appears to likely be completely closed. The left dorsal foot ulcer though still open does appear to be a little bit smaller and does also seem to be making good progress. He has not been having problems with the treatment at this point in the general he does not seem to show as much erythema surrounding the wound bed which is good news. He is not having significant discomfort. 08/12/17 on evaluation today patients wounds of both appeared to be doing better in fact they both appear to be completely healed. Overall I'm very pleased with how things have progressed over the past week even more than what I expected. Obviously this is good news and he is happy about this. He did have a fall when going to his orthopedic doctor recently fortunately he does not appear to have injured anything severely but nonetheless does have a couple of bumps and bruises nothing significant at this time but he needs to be seen and wound care for. He may have broken his left wrist he is following up with orthopedics in that regard. Readmission: 10/24/17 on evaluation today patient presents for reevaluation after having been discharged Aug 12, 2017. His wound was healed at that point although he states that it has been somewhat discolored since that time. He has not experienced the drainage as far as the wound is concerned but the fact that it was still discolored been greater than two months after  the fact made him want to come in for reevaluation. Upon inspection today the patient has no pain although he does have neuropathy. He does have a discolored region of the dorsal surface of the left foot which was the site where the ulcer was that we previously treated. The good news is he does not again have any discomfort although obviously I do believe this may be some scar tissue and just discoloration in that regard. Again he hasn't had any drainage or discharge since I last saw him and at this point I see no evidence of that either this area shows complete epithelialization. In general I do not feel like there's any significant issue at this point in that regard. READMISSION 02/09/2019 JAKIN, PAVAO ([id number]) This is a 74 year old man with type 2 diabetes poorly controlled with significant peripheral neuropathy. He has been in this clinic before in 2018 with a laceration on his right hand and then again in 2019 with an area over the right lateral malleolus. 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 He states that the wound is been there for the last week or 2. It is been uncomfortable to walk on. The man is an active man having to look after an elderly mother. Past medical history, type 2 diabetes with polyneuropathy with a recent hemoglobin A1c in September 2011 0.3, lumbar disc disease, hypergonadism., Left foot drop and gout. We did not repeat his ABIs in the clinic quoting values from July 2019 of 0.95 and 1.01. He is not felt to have significant PAD 02/19/2019 on evaluation today patient actually appears to be doing better with regard to his foot wound based on what I am seeing currently. He was seen by Dr. Dellia Nims on readmission back into the clinic last week when I was off. The good news is the patient has been tolerating the dressing changes without any complication in fact has not really had much  in the way of drainage. He does have an AFO brace which she believes is actually pushing on this area as well and has contributed to the issue. Fortunately there is no signs of active infection at this point. His ABIs appear to be well. 03/05/2019 on evaluation today patient appears to be doing well in regard to his foot ulcer. He has been tolerating the dressing changes without complication. Fortunately there is no signs of active infection. The wound is measuring somewhat smaller and overall I feel like he is making good progress at this point. 03/12/2019 on evaluation today patient actually appears to be doing quite well with regard to his foot ulcer. The wound is looking better the measurement is not significantly better at this point unfortunately but nonetheless again the overall appearance is improving I think he is making some progress. I still think the big issue here is foot drop and again the patient wants to see if potentially he could get a brace to help with this. Subsequently I think that we can definitely make a referral to [facility] to see if they can help [place] in this regard. He is in agreement with that plan. 03/26/2019 on evaluation today patient appears to be doing more poorly in regard to his foot ulcer at this time. He did see podiatry in order to see about getting a brace but unfortunately he tells me that Medicare is not likely to pay for one for him as he had 1 I guess 8 months ago. However this one that apparently goes in his shoes that does not seem to be working out well for him. Unfortunately there are signs of active infection at this time. No fevers, chills, nausea, vomiting, or diarrhea. Systemically. The patient has erythema on the dorsal surface of his foot that tracks from the lateral portion of his foot and again he does have a blister around the wound as well which is worse compared to previous. Overall I am concerned. 04/02/2019 on evaluation today patient  appears to be doing about the same with regard to his wound. Unfortunately there is no signs of significant improvement although I do think there is no signs of infection which is good news. His drainage is also slowed down tremendously. I think at this point he would benefit from the initiation of a total contact cast. The only concern I have is that over his balance. I explained to the patient that if we were to initiate the cast I think it would be greatly beneficial for him but he would need to ensure not to walk at all unless he  uses a walker he actually has 2 walkers one for inside his home and one that he keeps out in the car. For that reason he would be covered in that regard. I just do not want him to have any accidents and fall with the cast. 12/28-Patient comes in 1 week out a day early for his appointment he was in the hospital for 2 days for uncontrolled hyperglycemia and required management of high blood sugars, with some adjustments in his home regimen of insulin, patient stubbed his right great toe today and put a Band-Aid on it, he is here for his left lateral foot ulcer and we are using silver alginate dressing, he was intact total contact cast with difficulty with balance to the extent that that had to be discontinued 04/16/2019 on evaluation today patient appears to be doing better with regard to the overall appearance of his wound size. This is good news. We had attempted a total contact cast but unfortunately he had some falling issues that the family contacted [place] about we took the cast off we did not put it back on. Nonetheless it ended up that just a couple days later he was in the hospital due to elevations in his blood sugar which went to around 500 and even a little higher. Subsequently he was admitted to the hospital and was in the hospital for a total of 3 days and that included Christmas day. Fortunately there got his blood sugar under control he tells me that this is  running about 200 now which is much better news. There does not appear to be any signs of active infection at this time. No fevers, chills, nausea, vomiting, or diarrhea. 04/30/2019 on evaluation today patient actually appears to be doing slightly worse compared to previous as far as the overall size of his wound is concerned. There also is some need for sharp debridement today as well. Fortunately there is no evidence of active infection which is good news. No fevers, chills, nausea, vomiting, or diarrhea. 05/07/2019 upon evaluation today patient appears to be doing excellent in regard to his plantar foot ulcer. He has been tolerating the dressing changes without complication. Fortunately there is no signs of active infection at this time. No fevers, chills, nausea, vomiting, or diarrhea. He does tell me that he actually got in touch with [employer] and they feel like they can add diagnosis codes to his orders in order to get an external brace for him as opposed to the one that goes internal to issue. That would be excellent for his foot drop and I did tell him that if there is anything I need to fill out in that regard I will be more than happy to oblige and fill out what is needed for [employer]. 05/14/2019 upon evaluation today patient's original wound site actually appears to be doing much better which is good news. Fortunately there is no signs of active infection at this time. No fevers, chills, nausea, vomiting, or diarrhea. With that being said the patient unfortunately is continuing to have issues with rubbing he tells me that the felt slid on his foot although he did not realize it until today. Nonetheless I think this did cause a blister around the edge of the wound and he does an open wound that is slightly larger overall in measurement compared to last time mainly due to this blister. Other than that I feel like he is doing quite well. 05/21/2019 upon evaluation today patient appears to be doing  excellent in regard to his wound at this time on the plantar foot. He is showing signs of improvement he does have some slough and biofilm noted on the surface of the wound this is going require some sharp debridement today as well. Fortunately there is no evidence of active infection. No fevers, chills, nausea, vomiting, or diarrhea. 05/28/2019 on evaluation today patient's wound actually appears to be measuring smaller upon initial inspection and I do believe that it is doing well. With that being said he continues to have a blister on the outside/lateral edge of the wound that occurs as a result of rubbing due to his dropfoot currently. Subsequently we have filled out the paperwork for his new external brace but again that does take several weeks to get that completed once everything is returned to back we did send this back last week as far as the paperwork was concerned. With that being said there does not appear to be any signs of active infection at this time. 06/04/2019 on evaluation today patient's wound actually appears to be doing slightly better compared to last week which is good news. He still states that his insurance unfortunately does not seem to want to cover any of his new brace. He is going to just pay for this himself. Nonetheless MATHHEW, BUYSSE ([id number]) he is going by there today when he leaves here that is [employer] in [place] that is going to be making this brace for him. 06/18/2019 upon evaluation today patient fortunately does have his new brace which is the external AFO brace with bilateral uprights. Subsequently he seems to be doing much better in fact the wound already appears better. He has not even been using the felt he is just using a dressing over top of the wound and overall I am very pleased with how things seem to be progressing. There is no signs of active infection at this time which is great news and I think that the AFO is good to help him tremendously as  far as trying to keep pressure off of the 06/25/2019 upon evaluation today patient seems to be making good progress in regard to his wound. He has been tolerating the dressing changes without complication. Fortunately there is no signs of active infection at this time. No fevers, chills, nausea, vomiting, or diarrhea. 07/02/2019 upon evaluation today patient unfortunately had a blistered area on the bottom of his foot tracking more towards the midline of the plantar aspect. He does not seem to have issues with rubbing more laterally that he used to have but nonetheless this is still giving him some trouble and causing problems here. Fortunately there is no signs of active infection at this time. No fevers, chills, nausea, vomiting, or diarrhea. 07/09/2019 upon evaluation today patient appears to be doing well with regard to his plantar foot ulcer. Definitely this is much improved compared to last week's visit which again last week the issue there was that he had an area of callus buildup on the plantar aspect of his foot I was able to clean this away and after effectively doing so the wound seems to be doing much better which is great news. There is no signs of active infection at this time. 07/16/2019 upon evaluation today patient appears to be making some progress in my opinion with regard to his plantar foot ulcer. He has been tolerating the dressing changes without complication. Fortunately there is no evidence of active infection at this time which is great news. 07/23/2019  upon evaluation today patient appears to be doing well with regard to his wounds on the plantar foot. He has been tolerating the dressing changes without complication. These do seem to be healing. 08/06/2019 upon evaluation today patient's wound actually appears to be showing some signs of improvement though he still has a significant amount of callus buildup over this location. That is can require some debridement today. With that  being said the actual wound openings seem to be dramatically improved which is great news. There is no signs of active infection at this time. No fevers, chills, nausea, vomiting, or diarrhea. 08/13/2019 upon evaluation today patient appears to be doing well with regard to his foot ulcer. He has been tolerating the dressing changes without complication. Fortunately there is no signs of active infection at this time. No fevers, chills, nausea, vomiting, or diarrhea. 08/20/2019 upon evaluation today patient appears to be doing excellent in regard to his foot ulcer. I do believe he still showing signs of improvement this is very slow but again I think still pressure getting to the area even with his brace and custom shoe is still part of the main issue with the delay here. Nonetheless I do not see any signs of worsening or infection all of which is good news. 5/20; left lateral foot wound. He wears an AFO brace for foot drop related to his diabetes. The wound is small and clean looking he is using silver collagen. We have apparently tried him in offloading shoe he could not tolerate this. He does not have good balance. 09/11/2019 upon evaluation today patient appears to be doing more poorly in regard to his foot ulcer as compared to last time I saw him. Unfortunately he does tell me as we discussed on Friday that he had pus and blood coming from the area underneath the callus. Unfortunately I think that this is a combination of the callus not being trimmed away enough and potentially the felt pad causing an abnormal rubbing which has happened before which led to this becoming more irritated and likely infected based on what I am seeing today. The patient takes ampicillin all the time. Subsequently he also states that he has been trying for a couple of days to take the clindamycin of which she had some leftover from previous. With that being said it upset his stomach. The antibiotic that I sent in for him on  Friday unfortunately I wrote the wrong number on as far as the number of pills that he needed. With that being said I corrected that this morning but he tells me is not can pick it up as that seems to be making him have trouble with his stomach being upset at this point. Fortunately there is no signs of active infection at this time systemically. 09/17/2019 upon evaluation today patient appears to be doing better in regard to his foot ulcer. Has been taking ampicillin that he had leftover from a previous prescription. With that being said he did go see his doctor today concerning his diabetes and notes that his hemoglobin A1c was 13 obviously this is elevated and there can be working on that. Also did review the culture result that we finally got back for final well that showed rare Pseudomonas and a few group B strep noted. The Pseudomonas was sensitive to Cipro but again the wound is doing so much better with the ampicillin I think this is probably not a causative organism I do not see anything that really has  me concerned as far as infection is concerned today. Therefore I would hold off on prescribing Cipro for the patient and have him discontinue the ampicillin until completion. 09/24/2019 upon evaluation today patient appears to be doing 09/24/2019 upon evaluation today patient appears to be doing well with regard to his foot ulcer. He actually has not enlarged any and seems to be measuring smaller quite significantly at this point. His toe ulcer is also doing somewhat better which is good news. Fortunately there is no evidence of active infection at this time. No fevers, chills, nausea, vomiting, or diarrhea. 10/08/2019 upon evaluation today patient appears to be doing well with regard to his foot ulcer all things considered. Fortunately there is no signs of active infection at this time. No fevers, chills, nausea, vomiting, or diarrhea. Fortunately he states that overall he has been doing much  better with regard to not have any blistering on the side of his foot. Unfortunately he did have a fall last night due to the fact that he got up to walk without his brace on and did not have his walker either it was around 4 in the morning and his mother was calling out. Subsequently he states that the foot drop got him and he ended up following. Nonetheless he states he normally uses his walker I explained he needs to try to make sure not to ever go without the walker. No matter how attempting the situation may be. Readmission: 12/03/2019 upon evaluation today patient presents for follow-up after having had surgery last week on his foot for surgical debridement and then subsequent wound VAC placement. He had gone into the hospital as he was having a significant infection and not feeling well. He is unfortunately since I last saw him also been under the care of the cancer doctors and they have been taking good care of him. He does seem to be in fairly high spirits all things considered. With that being said he has lung cancer he tells me that his back from the radiation feels like it is constantly burned. 12/10/2019 on evaluation today patient appears to be doing well with regard to his wound. I do believe the wound VAC is doing a great job at helping to manage his drainage and overall health and to granulate in the wound. He has less undermining noted today in fact almost none. Home health is coming out to change the wound VAC on a regular basis Monday, Wednesday, and Friday. 12/24/2019 on evaluation today patient appears to be doing fairly well in regard to the wound on the plantar aspect of his foot. With that being said he does have a significant feeling in of what has been going on with regard to the ulceration which does not have the depth that it did in the past. With that being said I do believe that based on what I am seeing at this point the patient likely does need to take a break from the  wound [place] ([id number]) VAC and in fact it appears the wound VAC was actually placed directly on the skin as far as the bridging was concerned causing some irritation of the top of the patient's foot. This has me concerned as well that if were not careful that could worsen. I do believe a break with the wound VAC however will be fine and then we can see where we go from there. 01/07/2020 upon evaluation today patient actually appears to be doing much better in regard to  his wound. He has been tolerating the dressing changes without complication. There is no signs of active infection and overall I feel like he is managing quite nicely. I do believe that we would likely discontinue the [facility] altogether today. 10/13; follow-up visit. Patient has a area on the left lateral plantar foot. He has been using Hydrofera Blue and bordered foam he has home health changing this 3 times a week. He is nonambulatory. He avoids pushing the wheelchair with that part of his foot and instead uses his heel. We have had a nice improvement today and overall surface area Electronic Signature(s) Signed: 01/23/2020 4:31:41 PM By: Linton Ham MD Entered By: Linton Ham on 01/23/2020 11:17:48 [name] ([id number]) -------------------------------------------------------------------------------- Physical Exam Details Patient Name: [name] Date of Service: 01/23/2020 11:00 AM Medical Record Number: [id number] Patient Account Number: [id number] Date of Birth/Sex: 10-09-1945 (74 y.o. M) Treating RN: Cornell Barman Primary Care Provider: [name] Other Clinician: Referring Provider: [name] Treating Provider/Extender: Tito Dine in Treatment: 7 Constitutional Patient is hypertensive.did not take meds today. Pulse regular and within target range for patient.[name] Kitchen Respirations regular, non-labored and within target range.. Temperature is normal and within the target  range for the patient.[name] Kitchen appears in no distress. Cardiovascular Pedal pulses palpable. Notes Wound examination; clean wound on the left lateral midfoot. Nice healthy granulation. Healthy looking rim of epithelialization. No evidence of infection. Electronic Signature(s) Signed: 01/23/2020 4:31:41 PM By: Linton Ham MD Entered By: Linton Ham on 01/23/2020 11:19:27 [name] ([id number]) -------------------------------------------------------------------------------- Physician Orders Details Patient Name: [name] Date of Service: 01/23/2020 11:00 AM Medical Record Number: [id number] Patient Account Number: [id number] Date of Birth/Sex: 1945-09-29 (74 y.o. M) Treating RN: Cornell Barman Primary Care Provider: [name] Other Clinician: Referring Provider: [name] Treating Provider/Extender: Tito Dine in Treatment: 7 Verbal / Phone Orders: No Diagnosis Coding Wound Cleansing Wound #7 Left,Lateral,Plantar Foot o Clean wound with Normal Saline. Anesthetic (add to Medication List) Wound #7 Left,Lateral,Plantar Foot o Topical Lidocaine 4% cream applied to wound bed prior to debridement (In Clinic Only). Primary Wound Dressing Wound #7 Left,Lateral,Plantar Foot o Hydrafera Blue Ready Transfer Secondary Dressing Wound #7 Left,Lateral,Plantar Foot o Boardered Foam Dressing Dressing Change Frequency Wound #7 Left,Lateral,Plantar Foot o Change Dressing Monday, Wednesday, Friday Follow-up Appointments Wound #7 Left,Lateral,Plantar Foot o Return Appointment in 2 weeks. Edema Control Wound #7 Left,Lateral,Plantar Foot o Elevate legs to the level of the heart and pump ankles as often as possible Off-Loading Wound #7 Left,Lateral,Plantar Foot o Other: - No weight on foot, Wheelchair only Additional Orders / Instructions Wound #7 Left,Lateral,Plantar Foot o Increase protein intake. Home Health Wound #7  [place] Visits - [place] Nurse may visit PRN to address patientos wound care needs. o FACE TO FACE ENCOUNTER: MEDICARE and MEDICAID PATIENTS: I certify that this patient is under my care and that I had a face-to-face encounter that meets the physician face-to-face encounter requirements with this patient on this date. The encounter with the patient was in whole or in part for the following MEDICAL CONDITION: (primary reason for [place]) MEDICAL NECESSITY: I certify, that based on my findings, NURSING services are a medically necessary home health service. HOME BOUND STATUS: I certify that my clinical findings support that this patient is homebound (i.e., Due to illness or injury, pt requires aid of supportive devices such as crutches, cane, wheelchairs, walkers, the use of special transportation or the assistance  of another person to leave their place of residence. There is a normal inability to leave the home and doing so requires considerable and taxing effort. Other absences are for medical reasons / religious services and are infrequent or of short duration when for other reasons). FRANDY, BASNETT ([id number]) o If current dressing causes regression in wound condition, may D/C ordered dressing product/s and apply Normal Saline Moist Dressing daily until next [place] / Other MD appointment. [place] of regression in wound condition at [phone]. o Please direct any NON-WOUND related issues/requests for orders to patient's Primary Care Physician Electronic Signature(s) Signed: 01/23/2020 4:31:41 PM By: Linton Ham MD Signed: 01/28/2020 10:00:34 AM By: Gretta Cool, BSN, RN, CWS, Kim RN, BSN Entered By: Gretta Cool, BSN, RN, CWS, Kim on 01/23/2020 11:09:45 [name] ([id number]) -------------------------------------------------------------------------------- Problem List  Details Patient Name: [name] Date of Service: 01/23/2020 11:00 AM Medical Record Number: [id number] Patient Account Number: [id number] Date of Birth/Sex: 1945-07-03 (74 y.o. 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o. M) Treating RN: Cornell Barman Primary Care Provider: [name] Other Clinician: Referring Provider: [name] Treating Provider/Extender: Tito Dine in Treatment: 7 Subjective History of Present Illness (HPI) 10/04/16 on evaluation today patient presents for initial evaluation concerning a laceration of the right wrist and hand on the bowler aspect. He tells me that he fell into glass following a medication change in regard to his diabetes. He initially went to [facility] where she was sutured and subsequently his daughter who is a Marine scientist removed the teachers at the appropriate time. Unfortunately the wound has done poorly and dehissed which has caused him  some trouble including infection. He has been on antibiotics both topically and orally though the wound continues to show signs of necrotic tissue according to notes which is what he was sent to [place] for evaluation concerning. He does have diabetes and unfortunately this is severely uncontrolled. He does see an endocrinologist and is working on this. 10/11/2016 -- the patient's notes from the ER were reviewed and he had his suturing done on 09/17/2016 and had necrotic skin flaps which had to be trimmed the last visit he was here. He has managed to get his Santyl ointment and has been using this appropriately. 10/21/16 patient's wound appears to be doing very well on evaluation today. He still has some Slough covering the wound bed but this is nowhere near as severe as when i saw this initially two weeks ago nor even my review of his pictures last week. I'm pleased with how this is progressing. Patient's wound over the right form appears to be doing fairly well on evaluation today. There is no evidence of infection and he has a small  area at the crease of where she is wrist extends and flexes that is still open and appears to be having difficulty due to the fact that it is cracking open. I think this is something that may be controlled with moisture control that is keeping the wound a little bit more moist. Nonetheless this is a tiny region compared to the initial wound and overall appears to be doing very well. No fevers, chills, nausea, or vomiting noted at this time. 11/25/16 on evaluation today patient appears to be doing well in regard to his right wrist ulcer. In fact this appears to be completely healed he is having no discomfort. He is pleased with how this is progressed. Readmission: 06/24/17 on evaluation today patient appears back in our clinic regarding issues that he has been having with his right ankle on the lateral malleolus as well is in the dorsal surface of his left foot. Both have been  going on for several weeks unfortunately. He tells me that initially this was being managed by Dr. Edilia Bo and I did review the notes from Dr. Edilia Bo in epic. Patient was placed on amoxicillin initially and then subsequently Keflex following. He has been using triple antibiotic ointment over-the-counter along with a Band-Aid for the wound currently. He did not have any Santyl remaining. His most recent hemoglobin A1c which was just two days ago was 10.3. Fortunately he seems to have excellent blood flow in the bilateral lower extremities with his left ABI being 0.95 in the right ABI 1.01. Patient does have some pain in regard to each of these ulcerations but he tells me it's not nearly as severe as the hand wound that I help treat earlier over the summer. This was in 2018. 07/15/17 on evaluation today patient's lower surety ulcer is actually both appear to be doing fairly well at this point. He does have a little bit of skin overlapping the ulcer on the right lateral ankle this was debrided away today just with saline and gauze without complication patient had no discomfort or pain. Otherwise patient's left foot ulcer does appear to be showing signs of improvement he does seem to have a right great toe. Nikki and noted at this point in time. I think that this does need to be managed at this point. No fevers, chills, nausea, or vomiting noted at this time. 07/22/17 on evaluation today patient's wounds in regard to his lower extremities appear to be doing somewhat better. His right ankle wound in fact appears to likely be completely closed. The left dorsal foot ulcer though still open does appear to be a little bit smaller and does also seem to be making good progress. He has not been having problems with the treatment at this point in the general he does not seem to show as much erythema surrounding the wound bed which is good news. He is not having significant discomfort. 08/12/17 on evaluation today  patients wounds of both appeared to be doing better in fact they both appear to be completely healed. Overall I'm very pleased with how things have progressed over the past week even more than what I expected. Obviously this is good news and he is happy about this. He did have a fall when going to his orthopedic doctor recently fortunately he does not appear to have injured anything severely but nonetheless does have a couple of bumps and bruises nothing significant at this time but he needs to be seen and wound care for. He may have  broken his left wrist he is following up with orthopedics in that regard. Readmission: 10/24/17 on evaluation today patient presents for reevaluation after having been discharged Aug 12, 2017. His wound was healed at that point although he states that it has been somewhat discolored since that time. He has not experienced the drainage as far as the wound is concerned but the fact that it was still discolored been greater than two months after the fact made him want to come in for reevaluation. Upon inspection today the patient has no pain although he does have neuropathy. He does have a discolored region of the dorsal surface of the left foot which was the site where the ulcer was that we previously treated. The good news is he does not again have any discomfort although obviously I do believe this may be some scar tissue and just discoloration in that regard. Again he hasn't had any drainage or discharge since I last saw him and at this point I see no evidence of that either this area shows complete epithelialization. In general I do not feel like there's any significant issue at this point in that regard. 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 He has been in this clinic before in 2018 with a laceration on his right hand and then again in 2019 with an area over the right lateral  malleolus. 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 He states that the wound is been there for the last week or MOHAMADOU, MACIVER. ([id number]) 2. It is been uncomfortable to walk on. The man is an active man having to look after an elderly mother. Past medical history, type 2 diabetes with polyneuropathy with a recent hemoglobin A1c in September 2011 0.3, lumbar disc disease, hypergonadism., Left foot drop and gout. We did not repeat his ABIs in the clinic quoting values from July 2019 of 0.95 and 1.01. He is not felt to have significant PAD 02/19/2019 on evaluation today patient actually appears to be doing better with regard to his foot wound based on what I am seeing currently. He was seen by Dr. Dellia Nims on readmission back into the clinic last week when I was off. The good news is the patient has been tolerating the dressing changes without any complication in fact has not really had much in the way of drainage. He does have an AFO brace which she believes is actually pushing on this area as well and has contributed to the issue. Fortunately there is no signs of active infection at this point. His ABIs appear to be well. 03/05/2019 on evaluation today patient appears to be doing well in regard to his foot ulcer. He has been tolerating the dressing changes without complication. Fortunately there is no signs of active infection. The wound is measuring somewhat smaller and overall I feel like he is making good progress at this point. 03/12/2019 on evaluation today patient actually appears to be doing quite well with regard to his foot ulcer. The wound is looking better the measurement is not significantly better at this point unfortunately but nonetheless again the overall appearance is improving I think he is making some progress. I still think the big issue here is foot drop and again the patient wants to see if  potentially he could get a brace to help with this. Subsequently I  think that we can definitely make a referral to [facility] to see if they can help [place] in this regard. He is in agreement with that plan. 03/26/2019 on evaluation today patient appears to be doing more poorly in regard to his foot ulcer at this time. He did see podiatry in order to see about getting a brace but unfortunately he tells me that Medicare is not likely to pay for one for him as he had 1 I guess 8 months ago. However this one that apparently goes in his shoes that does not seem to be working out well for him. Unfortunately there are signs of active infection at this time. No fevers, chills, nausea, vomiting, or diarrhea. Systemically. The patient has erythema on the dorsal surface of his foot that tracks from the lateral portion of his foot and again he does have a blister around the wound as well which is worse compared to previous. Overall I am concerned. 04/02/2019 on evaluation today patient appears to be doing about the same with regard to his wound. Unfortunately there is no signs of significant improvement although I do think there is no signs of infection which is good news. His drainage is also slowed down tremendously. I think at this point he would benefit from the initiation of a total contact cast. The only concern I have is that over his balance. I explained to the patient that if we were to initiate the cast I think it would be greatly beneficial for him but he would need to ensure not to walk at all unless he uses a walker he actually has 2 walkers one for inside his home and one that he keeps out in the car. For that reason he would be covered in that regard. I just do not want him to have any accidents and fall with the cast. 12/28-Patient comes in 1 week out a day early for his appointment he was in the hospital for 2 days for uncontrolled hyperglycemia and required management of high blood sugars, with  some adjustments in his home regimen of insulin, patient stubbed his right great toe today and put a Band-Aid on it, he is here for his left lateral foot ulcer and we are using silver alginate dressing, he was intact total contact cast with difficulty with balance to the extent that that had to be discontinued 04/16/2019 on evaluation today patient appears to be doing better with regard to the overall appearance of his wound size. This is good news. We had attempted a total contact cast but unfortunately he had some falling issues that the family contacted [place] about we took the cast off we did not put it back on. Nonetheless it ended up that just a couple days later he was in the hospital due to elevations in his blood sugar which went to around 500 and even a little higher. Subsequently he was admitted to the hospital and was in the hospital for a total of 3 days and that included Christmas day. Fortunately there got his blood sugar under control he tells me that this is running about 200 now which is much better news. There does not appear to be any signs of active infection at this time. No fevers, chills, nausea, vomiting, or diarrhea. 04/30/2019 on evaluation today patient actually appears to be doing slightly worse compared to previous as far as the overall size of his wound is concerned. There also is some need for sharp debridement today as well. Fortunately  there is no evidence of active infection which is good news. No fevers, chills, nausea, vomiting, or diarrhea. 05/07/2019 upon evaluation today patient appears to be doing excellent in regard to his plantar foot ulcer. He has been tolerating the dressing changes without complication. Fortunately there is no signs of active infection at this time. No fevers, chills, nausea, vomiting, or diarrhea. He does tell me that he actually got in touch with [employer] and they feel like they can add diagnosis codes to his orders in order to get an  external brace for him as opposed to the one that goes internal to issue. That would be excellent for his foot drop and I did tell him that if there is anything I need to fill out in that regard I will be more than happy to oblige and fill out what is needed for [employer]. 05/14/2019 upon evaluation today patient's original wound site actually appears to be doing much better which is good news. Fortunately there is no signs of active infection at this time. No fevers, chills, nausea, vomiting, or diarrhea. With that being said the patient unfortunately is continuing to have issues with rubbing he tells me that the felt slid on his foot although he did not realize it until today. Nonetheless I think this did cause a blister around the edge of the wound and he does an open wound that is slightly larger overall in measurement compared to last time mainly due to this blister. Other than that I feel like he is doing quite well. 05/21/2019 upon evaluation today patient appears to be doing excellent in regard to his wound at this time on the plantar foot. He is showing signs of improvement he does have some slough and biofilm noted on the surface of the wound this is going require some sharp debridement today as well. Fortunately there is no evidence of active infection. No fevers, chills, nausea, vomiting, or diarrhea. 05/28/2019 on evaluation today patient's wound actually appears to be measuring smaller upon initial inspection and I do believe that it is doing well. With that being said he continues to have a blister on the outside/lateral edge of the wound that occurs as a result of rubbing due to his dropfoot currently. Subsequently we have filled out the paperwork for his new external brace but again that does take several weeks to get that completed once everything is returned to back we did send this back last week as far as the paperwork was concerned. With that being said there does not appear to be any  signs of active infection at this time. 06/04/2019 on evaluation today patient's wound actually appears to be doing slightly better compared to last week which is good news. He still states that his insurance unfortunately does not seem to want to cover any of his new brace. He is going to just pay for this himself. Nonetheless he is going by there today when he leaves here that is [employer] in [place] that is going to be making this brace for him. 06/18/2019 upon evaluation today patient fortunately does have his new brace which is the external AFO brace with bilateral uprights. Subsequently he seems to be doing much better in fact the wound already appears better. He has not even been using the felt he is just using a dressing over Lovilia ([id number]) top of the wound and overall I am very pleased with how things seem to be progressing. There is no signs of active infection at  this time which is great news and I think that the AFO is good to help him tremendously as far as trying to keep pressure off of the 06/25/2019 upon evaluation today patient seems to be making good progress in regard to his wound. He has been tolerating the dressing changes without complication. Fortunately there is no signs of active infection at this time. No fevers, chills, nausea, vomiting, or diarrhea. 07/02/2019 upon evaluation today patient unfortunately had a blistered area on the bottom of his foot tracking more towards the midline of the plantar aspect. He does not seem to have issues with rubbing more laterally that he used to have but nonetheless this is still giving him some trouble and causing problems here. Fortunately there is no signs of active infection at this time. No fevers, chills, nausea, vomiting, or diarrhea. 07/09/2019 upon evaluation today patient appears to be doing well with regard to his plantar foot ulcer. Definitely this is much improved compared to last week's visit which again last week  the issue there was that he had an area of callus buildup on the plantar aspect of his foot I was able to clean this away and after effectively doing so the wound seems to be doing much better which is great news. There is no signs of active infection at this time. 07/16/2019 upon evaluation today patient appears to be making some progress in my opinion with regard to his plantar foot ulcer. He has been tolerating the dressing changes without complication. Fortunately there is no evidence of active infection at this time which is great news. 07/23/2019 upon evaluation today patient appears to be doing well with regard to his wounds on the plantar foot. He has been tolerating the dressing changes without complication. These do seem to be healing. 08/06/2019 upon evaluation today patient's wound actually appears to be showing some signs of improvement though he still has a significant amount of callus buildup over this location. That is can require some debridement today. With that being said the actual wound openings seem to be dramatically improved which is great news. There is no signs of active infection at this time. No fevers, chills, nausea, vomiting, or diarrhea. 08/13/2019 upon evaluation today patient appears to be doing well with regard to his foot ulcer. He has been tolerating the dressing changes without complication. Fortunately there is no signs of active infection at this time. No fevers, chills, nausea, vomiting, or diarrhea. 08/20/2019 upon evaluation today patient appears to be doing excellent in regard to his foot ulcer. I do believe he still showing signs of improvement this is very slow but again I think still pressure getting to the area even with his brace and custom shoe is still part of the main issue with the delay here. Nonetheless I do not see any signs of worsening or infection all of which is good news. 5/20; left lateral foot wound. He wears an AFO brace for foot drop related to  his diabetes. The wound is small and clean looking he is using silver collagen. We have apparently tried him in offloading shoe he could not tolerate this. He does not have good balance. 09/11/2019 upon evaluation today patient appears to be doing more poorly in regard to his foot ulcer as compared to last time I saw him. Unfortunately he does tell me as we discussed on Friday that he had pus and blood coming from the area underneath the callus. Unfortunately I think that this is a combination of the  callus not being trimmed away enough and potentially the felt pad causing an abnormal rubbing which has happened before which led to this becoming more irritated and likely infected based on what I am seeing today. The patient takes ampicillin all the time. Subsequently he also states that he has been trying for a couple of days to take the clindamycin of which she had some leftover from previous. With that being said it upset his stomach. The antibiotic that I sent in for him on Friday unfortunately I wrote the wrong number on as far as the number of pills that he needed. With that being said I corrected that this morning but he tells me is not can pick it up as that seems to be making him have trouble with his stomach being upset at this point. Fortunately there is no signs of active infection at this time systemically. 09/17/2019 upon evaluation today patient appears to be doing better in regard to his foot ulcer. Has been taking ampicillin that he had leftover from a previous prescription. With that being said he did go see his doctor today concerning his diabetes and notes that his hemoglobin A1c was 13 obviously this is elevated and there can be working on that. Also did review the culture result that we finally got back for final well that showed rare Pseudomonas and a few group B strep noted. The Pseudomonas was sensitive to Cipro but again the wound is doing so much better with the ampicillin I think  this is probably not a causative organism I do not see anything that really has me concerned as far as infection is concerned today. Therefore I would hold off on prescribing Cipro for the patient and have him discontinue the ampicillin until completion. 09/24/2019 upon evaluation today patient appears to be doing 09/24/2019 upon evaluation today patient appears to be doing well with regard to his foot ulcer. He actually has not enlarged any and seems to be measuring smaller quite significantly at this point. His toe ulcer is also doing somewhat better which is good news. Fortunately there is no evidence of active infection at this time. No fevers, chills, nausea, vomiting, or diarrhea. 10/08/2019 upon evaluation today patient appears to be doing well with regard to his foot ulcer all things considered. Fortunately there is no signs of active infection at this time. No fevers, chills, nausea, vomiting, or diarrhea. Fortunately he states that overall he has been doing much better with regard to not have any blistering on the side of his foot. Unfortunately he did have a fall last night due to the fact that he got up to walk without his brace on and did not have his walker either it was around 4 in the morning and his mother was calling out. Subsequently he states that the foot drop got him and he ended up following. Nonetheless he states he normally uses his walker I explained he needs to try to make sure not to ever go without the walker. No matter how attempting the situation may be. Readmission: 12/03/2019 upon evaluation today patient presents for follow-up after having had surgery last week on his foot for surgical debridement and then subsequent wound VAC placement. He had gone into the hospital as he was having a significant infection and not feeling well. He is unfortunately since I last saw him also been under the care of the cancer doctors and they have been taking good care of him. He does seem  to be in  fairly high spirits all things considered. With that being said he has lung cancer he tells me that his back from the radiation feels like it is constantly burned. 12/10/2019 on evaluation today patient appears to be doing well with regard to his wound. I do believe the wound VAC is doing a great job at helping to manage his drainage and overall health and to granulate in the wound. He has less undermining noted today in fact almost none. Home health is coming out to change the wound VAC on a regular basis Monday, Wednesday, and Friday. 12/24/2019 on evaluation today patient appears to be doing fairly well in regard to the wound on the plantar aspect of his foot. With that being said he does have a significant feeling in of what has been going on with regard to the ulceration which does not have the depth that it did in the past. With that being said I do believe that based on what I am seeing at this point the patient likely does need to take a break from the wound VAC and in fact it appears the wound VAC was actually placed directly on the skin as far as the bridging was concerned causing some irritation of the top of the patient's foot. This has me concerned as well that if were not careful that could worsen. I do believe a break with the wound VAC however will be fine and then we can see where we go from there. KIANTE, CIAVARELLA ([id number]) 01/07/2020 upon evaluation today patient actually appears to be doing much better in regard to his wound. He has been tolerating the dressing changes without complication. There is no signs of active infection and overall I feel like he is managing quite nicely. I do believe that we would likely discontinue the [facility] altogether today. 10/13; follow-up visit. Patient has a area on the left lateral plantar foot. He has been using Hydrofera Blue and bordered foam he has home health changing this 3 times a week. He is nonambulatory. He avoids pushing the  wheelchair with that part of his foot and instead uses his heel. We have had a nice improvement today and overall surface area Objective Constitutional Patient is hypertensive.did not take meds today. Pulse regular and within target range for patient.[name] Kitchen Respirations regular, non-labored and within target range.. Temperature is normal and within the target range for the patient.[name] Kitchen appears in no distress. Vitals Time Taken: 10:53 AM, Height: 71 in, Weight: 190 lbs, BMI: 26.5, Temperature: 97.8 F, Pulse: 85 bpm, Respiratory Rate: 16 breaths/min, Blood Pressure: 200/95 mmHg. Cardiovascular Pedal pulses palpable. General Notes: Wound examination; clean wound on the left lateral midfoot. Nice healthy granulation. Healthy looking rim of epithelialization. No evidence of infection. Integumentary (Hair, Skin) Wound #7 status is Open. Original cause of wound was Surgical Injury. The wound is located on the [place]. The wound measures 1.6cm length x 1.5cm width x 0.2cm depth; 1.885cm^2 area and 0.377cm^3 volume. There is Fat Layer (Subcutaneous Tissue) exposed. There is a medium amount of serosanguineous drainage noted. The wound margin is flat and intact. There is small (1-33%) pink granulation within the wound bed. There is a large (67-100%) amount of necrotic tissue within the wound bed including Adherent Slough. 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 Anesthetic (add to Medication List): Wound #7 Left,Lateral,Plantar Foot: Topical Lidocaine 4% cream applied to wound bed prior to debridement (In Clinic Only). Primary Wound Dressing: Wound #7 Left,Lateral,Plantar Foot: Hydrafera Blue Ready  Transfer Secondary Dressing: Wound #7 Left,Lateral,Plantar Foot: Boardered Foam Dressing Dressing Change Frequency: Wound #7 Left,Lateral,Plantar Foot: Change Dressing Monday, Wednesday, Friday Follow-up Appointments: Wound #7 Left,Lateral,Plantar Foot: ROMEO, ZIELINSKI ([id number]) Return Appointment in 2 weeks. Edema Control: Wound #7 Left,Lateral,Plantar Foot: Elevate legs to the level of the heart and pump ankles as often as possible Off-Loading: Wound #7 Left,Lateral,Plantar Foot: Other: - No weight on foot, Wheelchair only Additional Orders / Instructions: Wound #7 Left,Lateral,Plantar Foot: Increase protein intake. Home Health: Wound #7 Left,Lateral,Plantar Foot: Continue Home Health Visits - [place] Nurse may visit PRN to address patient s wound care needs. FACE TO FACE ENCOUNTER: MEDICARE and MEDICAID PATIENTS: I certify that this patient is under my care and that I had a face-to-face encounter that meets the physician face-to-face encounter requirements with this patient on this date. The encounter with the patient was in whole or in part for the following MEDICAL CONDITION: (primary reason for [place]) MEDICAL NECESSITY: I certify, that based on my findings, NURSING services are a medically necessary home health service. HOME BOUND STATUS: I certify that my clinical findings support that this patient is homebound (i.e., Due to illness or injury, pt requires aid of supportive devices such as crutches, cane, wheelchairs, walkers, the use of special transportation or the assistance of another person to leave their place of residence. There is a normal inability to leave the home and doing so requires considerable and taxing effort. Other absences are for medical reasons / religious services and are infrequent or of short duration when for other reasons). If current dressing causes regression in wound condition, may D/C ordered dressing product/s and apply  Normal Saline Moist Dressing daily until next [place] / Other MD appointment. [place] of regression in wound condition at [phone]. Please direct any NON-WOUND related issues/requests for orders to patient's Primary Care Physician 1. The patient continues to make nice improvement. 2. No need to change current treatment 3. He appears to be offloading this adequately and we talked about this. 4. He has home health changing Hydrofera Blue and bordered foam Electronic Signature(s) Signed: 01/23/2020 4:31:41 PM By: Linton Ham MD Entered By: Linton Ham on 01/23/2020 11:21:58 [name] ([id number]) -------------------------------------------------------------------------------- [place] Details Patient Name: [name] Date of Service: 01/23/2020 Medical Record Number: [id number] Patient Account Number: [id number] Date of Birth/Sex: 12/30/1945 (74 y.o. M) Treating RN: Cornell Barman Primary Care Provider: [name] Other Clinician: Referring Provider: [name] Treating Provider/Extender: Tito Dine in Treatment: 7 Diagnosis Coding ICD-10 Codes Code Description E11.621 Type 2 diabetes mellitus with foot ulcer L97.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scle E11.42 Type 2 diabetes mellitus with diabetic polyneuropathy M21.372 Foot drop, left foot Facility Procedures CPT4 Code: [id number] Description: 99213 - WOUND CARE VISIT-LEV 3 EST PT Modifier: Quantity: 1 Physician Procedures CPT4 Code: [id number] Description: 99213 - WC PHYS LEVEL 3 - EST PT Modifier: Quantity: 1 CPT4 Code: Description: ICD-10 Diagnosis Description E11.621 Type 2 diabetes mellitus with foot ulcer L97.523 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

## 2020-01-30 DIAGNOSIS — E1142 Type 2 diabetes mellitus with diabetic polyneuropathy: Secondary | ICD-10-CM | POA: Diagnosis not present

## 2020-01-30 DIAGNOSIS — R000 Tachycardia, unspecified: Secondary | ICD-10-CM | POA: Diagnosis not present

## 2020-01-30 DIAGNOSIS — S2232XD Fracture of one rib, left side, subsequent encounter for fracture with routine healing: Secondary | ICD-10-CM | POA: Diagnosis not present

## 2020-01-30 DIAGNOSIS — W010XXD Fall on same level from slipping, tripping and stumbling without subsequent striking against object, subsequent encounter: Secondary | ICD-10-CM | POA: Diagnosis not present

## 2020-01-30 DIAGNOSIS — C3491 Malignant neoplasm of unspecified part of right bronchus or lung: Secondary | ICD-10-CM | POA: Diagnosis not present

## 2020-01-30 DIAGNOSIS — I129 Hypertensive chronic kidney disease with stage 1 through stage 4 chronic kidney disease, or unspecified chronic kidney disease: Secondary | ICD-10-CM | POA: Diagnosis not present

## 2020-01-30 DIAGNOSIS — Z9181 History of falling: Secondary | ICD-10-CM | POA: Diagnosis not present

## 2020-01-30 DIAGNOSIS — E041 Nontoxic single thyroid nodule: Secondary | ICD-10-CM | POA: Diagnosis not present

## 2020-01-30 DIAGNOSIS — E11621 Type 2 diabetes mellitus with foot ulcer: Secondary | ICD-10-CM | POA: Diagnosis not present

## 2020-01-30 DIAGNOSIS — L97429 Non-pressure chronic ulcer of left heel and midfoot with unspecified severity: Secondary | ICD-10-CM | POA: Diagnosis not present

## 2020-01-30 DIAGNOSIS — Z87891 Personal history of nicotine dependence: Secondary | ICD-10-CM | POA: Diagnosis not present

## 2020-01-30 DIAGNOSIS — E1165 Type 2 diabetes mellitus with hyperglycemia: Secondary | ICD-10-CM | POA: Diagnosis not present

## 2020-01-30 DIAGNOSIS — G4733 Obstructive sleep apnea (adult) (pediatric): Secondary | ICD-10-CM | POA: Diagnosis not present

## 2020-01-30 DIAGNOSIS — J45909 Unspecified asthma, uncomplicated: Secondary | ICD-10-CM | POA: Diagnosis not present

## 2020-01-30 DIAGNOSIS — L02612 Cutaneous abscess of left foot: Secondary | ICD-10-CM | POA: Diagnosis not present

## 2020-01-30 DIAGNOSIS — N1831 Chronic kidney disease, stage 3a: Secondary | ICD-10-CM | POA: Diagnosis not present

## 2020-01-30 DIAGNOSIS — Z794 Long term (current) use of insulin: Secondary | ICD-10-CM | POA: Diagnosis not present

## 2020-02-01 DIAGNOSIS — S2232XD Fracture of one rib, left side, subsequent encounter for fracture with routine healing: Secondary | ICD-10-CM | POA: Diagnosis not present

## 2020-02-01 DIAGNOSIS — R000 Tachycardia, unspecified: Secondary | ICD-10-CM | POA: Diagnosis not present

## 2020-02-01 DIAGNOSIS — E1165 Type 2 diabetes mellitus with hyperglycemia: Secondary | ICD-10-CM | POA: Diagnosis not present

## 2020-02-01 DIAGNOSIS — Z87891 Personal history of nicotine dependence: Secondary | ICD-10-CM | POA: Diagnosis not present

## 2020-02-01 DIAGNOSIS — L97429 Non-pressure chronic ulcer of left heel and midfoot with unspecified severity: Secondary | ICD-10-CM | POA: Diagnosis not present

## 2020-02-01 DIAGNOSIS — J45909 Unspecified asthma, uncomplicated: Secondary | ICD-10-CM | POA: Diagnosis not present

## 2020-02-01 DIAGNOSIS — Z9181 History of falling: Secondary | ICD-10-CM | POA: Diagnosis not present

## 2020-02-01 DIAGNOSIS — E1142 Type 2 diabetes mellitus with diabetic polyneuropathy: Secondary | ICD-10-CM | POA: Diagnosis not present

## 2020-02-01 DIAGNOSIS — I129 Hypertensive chronic kidney disease with stage 1 through stage 4 chronic kidney disease, or unspecified chronic kidney disease: Secondary | ICD-10-CM | POA: Diagnosis not present

## 2020-02-01 DIAGNOSIS — E041 Nontoxic single thyroid nodule: Secondary | ICD-10-CM | POA: Diagnosis not present

## 2020-02-01 DIAGNOSIS — G4733 Obstructive sleep apnea (adult) (pediatric): Secondary | ICD-10-CM | POA: Diagnosis not present

## 2020-02-01 DIAGNOSIS — E11621 Type 2 diabetes mellitus with foot ulcer: Secondary | ICD-10-CM | POA: Diagnosis not present

## 2020-02-01 DIAGNOSIS — C3491 Malignant neoplasm of unspecified part of right bronchus or lung: Secondary | ICD-10-CM | POA: Diagnosis not present

## 2020-02-01 DIAGNOSIS — E1122 Type 2 diabetes mellitus with diabetic chronic kidney disease: Secondary | ICD-10-CM | POA: Diagnosis not present

## 2020-02-01 DIAGNOSIS — N1831 Chronic kidney disease, stage 3a: Secondary | ICD-10-CM | POA: Diagnosis not present

## 2020-02-01 DIAGNOSIS — Z794 Long term (current) use of insulin: Secondary | ICD-10-CM | POA: Diagnosis not present

## 2020-02-04 ENCOUNTER — Other Ambulatory Visit: Payer: Self-pay | Admitting: Medical Oncology

## 2020-02-04 ENCOUNTER — Other Ambulatory Visit: Payer: Self-pay

## 2020-02-04 ENCOUNTER — Inpatient Hospital Stay: Payer: Medicare Other

## 2020-02-04 DIAGNOSIS — E11621 Type 2 diabetes mellitus with foot ulcer: Secondary | ICD-10-CM | POA: Diagnosis not present

## 2020-02-04 DIAGNOSIS — Z5111 Encounter for antineoplastic chemotherapy: Secondary | ICD-10-CM | POA: Diagnosis not present

## 2020-02-04 DIAGNOSIS — Z9181 History of falling: Secondary | ICD-10-CM | POA: Diagnosis not present

## 2020-02-04 DIAGNOSIS — I129 Hypertensive chronic kidney disease with stage 1 through stage 4 chronic kidney disease, or unspecified chronic kidney disease: Secondary | ICD-10-CM | POA: Diagnosis not present

## 2020-02-04 DIAGNOSIS — Z794 Long term (current) use of insulin: Secondary | ICD-10-CM | POA: Diagnosis not present

## 2020-02-04 DIAGNOSIS — C3490 Malignant neoplasm of unspecified part of unspecified bronchus or lung: Secondary | ICD-10-CM

## 2020-02-04 DIAGNOSIS — E041 Nontoxic single thyroid nodule: Secondary | ICD-10-CM | POA: Diagnosis not present

## 2020-02-04 DIAGNOSIS — E1165 Type 2 diabetes mellitus with hyperglycemia: Secondary | ICD-10-CM | POA: Diagnosis not present

## 2020-02-04 DIAGNOSIS — E1122 Type 2 diabetes mellitus with diabetic chronic kidney disease: Secondary | ICD-10-CM | POA: Diagnosis not present

## 2020-02-04 DIAGNOSIS — J45909 Unspecified asthma, uncomplicated: Secondary | ICD-10-CM | POA: Diagnosis not present

## 2020-02-04 DIAGNOSIS — C3491 Malignant neoplasm of unspecified part of right bronchus or lung: Secondary | ICD-10-CM

## 2020-02-04 DIAGNOSIS — G4733 Obstructive sleep apnea (adult) (pediatric): Secondary | ICD-10-CM | POA: Diagnosis not present

## 2020-02-04 DIAGNOSIS — C3431 Malignant neoplasm of lower lobe, right bronchus or lung: Secondary | ICD-10-CM | POA: Diagnosis not present

## 2020-02-04 DIAGNOSIS — Z23 Encounter for immunization: Secondary | ICD-10-CM | POA: Diagnosis not present

## 2020-02-04 DIAGNOSIS — S2232XD Fracture of one rib, left side, subsequent encounter for fracture with routine healing: Secondary | ICD-10-CM | POA: Diagnosis not present

## 2020-02-04 DIAGNOSIS — N1831 Chronic kidney disease, stage 3a: Secondary | ICD-10-CM | POA: Diagnosis not present

## 2020-02-04 DIAGNOSIS — L97429 Non-pressure chronic ulcer of left heel and midfoot with unspecified severity: Secondary | ICD-10-CM | POA: Diagnosis not present

## 2020-02-04 DIAGNOSIS — Z5189 Encounter for other specified aftercare: Secondary | ICD-10-CM | POA: Diagnosis not present

## 2020-02-04 DIAGNOSIS — D649 Anemia, unspecified: Secondary | ICD-10-CM

## 2020-02-04 DIAGNOSIS — R000 Tachycardia, unspecified: Secondary | ICD-10-CM | POA: Diagnosis not present

## 2020-02-04 DIAGNOSIS — E1142 Type 2 diabetes mellitus with diabetic polyneuropathy: Secondary | ICD-10-CM | POA: Diagnosis not present

## 2020-02-04 DIAGNOSIS — Z87891 Personal history of nicotine dependence: Secondary | ICD-10-CM | POA: Diagnosis not present

## 2020-02-04 DIAGNOSIS — E876 Hypokalemia: Secondary | ICD-10-CM

## 2020-02-04 DIAGNOSIS — R739 Hyperglycemia, unspecified: Secondary | ICD-10-CM | POA: Diagnosis not present

## 2020-02-04 LAB — CBC WITH DIFFERENTIAL (CANCER CENTER ONLY)
Abs Immature Granulocytes: 0.29 10*3/uL — ABNORMAL HIGH (ref 0.00–0.07)
Basophils Absolute: 0 10*3/uL (ref 0.0–0.1)
Basophils Relative: 0 %
Eosinophils Absolute: 0 10*3/uL (ref 0.0–0.5)
Eosinophils Relative: 1 %
HCT: 19.5 % — ABNORMAL LOW (ref 39.0–52.0)
Hemoglobin: 6.8 g/dL — CL (ref 13.0–17.0)
Immature Granulocytes: 5 %
Lymphocytes Relative: 8 %
Lymphs Abs: 0.5 10*3/uL — ABNORMAL LOW (ref 0.7–4.0)
MCH: 29.8 pg (ref 26.0–34.0)
MCHC: 34.9 g/dL (ref 30.0–36.0)
MCV: 85.5 fL (ref 80.0–100.0)
Monocytes Absolute: 0.6 10*3/uL (ref 0.1–1.0)
Monocytes Relative: 9 %
Neutro Abs: 4.8 10*3/uL (ref 1.7–7.7)
Neutrophils Relative %: 77 %
Platelet Count: 27 10*3/uL — ABNORMAL LOW (ref 150–400)
RBC: 2.28 MIL/uL — ABNORMAL LOW (ref 4.22–5.81)
RDW: 14.5 % (ref 11.5–15.5)
WBC Count: 6.2 10*3/uL (ref 4.0–10.5)
nRBC: 0.5 % — ABNORMAL HIGH (ref 0.0–0.2)

## 2020-02-04 LAB — CMP (CANCER CENTER ONLY)
ALT: 10 U/L (ref 0–44)
AST: 8 U/L — ABNORMAL LOW (ref 15–41)
Albumin: 2.7 g/dL — ABNORMAL LOW (ref 3.5–5.0)
Alkaline Phosphatase: 117 U/L (ref 38–126)
Anion gap: 6 (ref 5–15)
BUN: 10 mg/dL (ref 8–23)
CO2: 32 mmol/L (ref 22–32)
Calcium: 8.7 mg/dL — ABNORMAL LOW (ref 8.9–10.3)
Chloride: 98 mmol/L (ref 98–111)
Creatinine: 1.07 mg/dL (ref 0.61–1.24)
GFR, Estimated: >60 mL/min (ref >60)
Glucose, Bld: 148 mg/dL — ABNORMAL HIGH (ref 70–99)
Potassium: 3 mmol/L — CL (ref 3.5–5.1)
Sodium: 136 mmol/L (ref 135–145)
Total Bilirubin: 0.5 mg/dL (ref 0.3–1.2)
Total Protein: 6.2 g/dL — ABNORMAL LOW (ref 6.5–8.1)

## 2020-02-04 LAB — SAMPLE TO BLOOD BANK

## 2020-02-04 LAB — PREPARE RBC (CROSSMATCH)

## 2020-02-04 MED ORDER — POTASSIUM CHLORIDE CRYS ER 20 MEQ PO TBCR: 20.0000 meq | EXTENDED_RELEASE_TABLET | Freq: Two times a day (BID) | ORAL | 0 refills | Status: DC

## 2020-02-04 MED ORDER — HEPARIN SOD (PORK) LOCK FLUSH 100 UNIT/ML IV SOLN
500.0000 [IU] | Freq: Once | INTRAVENOUS | Status: AC | PRN
  Administered 2020-02-04: 500 [IU]
  Filled 2020-02-04: qty 5

## 2020-02-04 MED ORDER — SODIUM CHLORIDE 0.9% FLUSH
10.0000 mL | INTRAVENOUS | Status: DC | PRN
  Administered 2020-02-04: 10 mL
  Filled 2020-02-04: qty 10

## 2020-02-04 NOTE — Progress Notes (Signed)
CRITICAL VALUE STICKER  CRITICAL VALUE: potassium 3.0  RECEIVER (on-site recipient of call): Meriel Flavors LPN [place] NOTIFIED:  02/04/20  1304 MESSENGER (representative from lab): Pam MD NOTIFIED:  Diane RN/Dr [place]: [id number] RESPONSE:

## 2020-02-04 NOTE — Addendum Note (Signed)
Addended by: Ardeen Garland on: 02/04/2020 01:15 PM   Modules accepted: Orders

## 2020-02-05 ENCOUNTER — Other Ambulatory Visit: Payer: Self-pay | Admitting: Physician Assistant

## 2020-02-05 ENCOUNTER — Inpatient Hospital Stay: Payer: Medicare Other

## 2020-02-05 DIAGNOSIS — Z5111 Encounter for antineoplastic chemotherapy: Secondary | ICD-10-CM | POA: Diagnosis not present

## 2020-02-05 DIAGNOSIS — Z23 Encounter for immunization: Secondary | ICD-10-CM | POA: Diagnosis not present

## 2020-02-05 DIAGNOSIS — I10 Essential (primary) hypertension: Secondary | ICD-10-CM

## 2020-02-05 DIAGNOSIS — Z5189 Encounter for other specified aftercare: Secondary | ICD-10-CM | POA: Diagnosis not present

## 2020-02-05 DIAGNOSIS — C3431 Malignant neoplasm of lower lobe, right bronchus or lung: Secondary | ICD-10-CM | POA: Diagnosis not present

## 2020-02-05 DIAGNOSIS — R739 Hyperglycemia, unspecified: Secondary | ICD-10-CM | POA: Diagnosis not present

## 2020-02-05 DIAGNOSIS — D649 Anemia, unspecified: Secondary | ICD-10-CM

## 2020-02-05 MED ORDER — DIPHENHYDRAMINE HCL 25 MG PO CAPS
25.0000 mg | ORAL_CAPSULE | Freq: Once | ORAL | Status: AC
  Administered 2020-02-05: 25 mg via ORAL

## 2020-02-05 MED ORDER — HEPARIN SOD (PORK) LOCK FLUSH 100 UNIT/ML IV SOLN
500.0000 [IU] | Freq: Once | INTRAVENOUS | Status: AC
  Administered 2020-02-05: 500 [IU] via INTRAVENOUS
  Filled 2020-02-05: qty 5

## 2020-02-05 MED ORDER — CLONIDINE HCL 0.1 MG PO TABS
0.2000 mg | ORAL_TABLET | Freq: Once | ORAL | Status: AC
  Administered 2020-02-05: 0.2 mg via ORAL

## 2020-02-05 MED ORDER — ACETAMINOPHEN 325 MG PO TABS
650.0000 mg | ORAL_TABLET | Freq: Once | ORAL | Status: AC
  Administered 2020-02-05: 650 mg via ORAL

## 2020-02-05 MED ORDER — SODIUM CHLORIDE 0.9% FLUSH
10.0000 mL | INTRAVENOUS | Status: AC | PRN
  Administered 2020-02-05: 10 mL
  Filled 2020-02-05: qty 10

## 2020-02-05 NOTE — Patient Instructions (Signed)

## 2020-02-05 NOTE — Progress Notes (Signed)
At the end of the second blood unit pt.'s BP and HR were high ( see flowsheet). PT.'s provider office was contacted for instructions. Per Cassandra Heilingoetter, [name] 0.2 mg clonidine given and BP to be repeated in 30 min.If still high pt. Was to be sent to the ER. Repeat BP was acceptable and pt. Was feeling fine. He stated that he did not take his HTN meds this morning as he was suppose to.  Pt. Was released stable and asymptomatic.

## 2020-02-06 ENCOUNTER — Encounter: Payer: Medicare Other | Admitting: Internal Medicine

## 2020-02-06 ENCOUNTER — Other Ambulatory Visit: Payer: Self-pay

## 2020-02-06 DIAGNOSIS — Z794 Long term (current) use of insulin: Secondary | ICD-10-CM | POA: Diagnosis not present

## 2020-02-06 DIAGNOSIS — E11621 Type 2 diabetes mellitus with foot ulcer: Secondary | ICD-10-CM | POA: Diagnosis not present

## 2020-02-06 DIAGNOSIS — L97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scle: Secondary | ICD-10-CM | POA: Diagnosis not present

## 2020-02-06 DIAGNOSIS — E1142 Type 2 diabetes mellitus with diabetic polyneuropathy: Secondary | ICD-10-CM | POA: Diagnosis not present

## 2020-02-06 DIAGNOSIS — L97512 Non-pressure chronic ulcer of other part of right foot with fat layer exposed: Secondary | ICD-10-CM | POA: Diagnosis not present

## 2020-02-06 LAB — TYPE AND SCREEN
ABO/RH(D): A POS
Antibody Screen: NEGATIVE
Unit division: 0
Unit division: 0

## 2020-02-06 LAB — BPAM RBC
Blood Product Expiration Date: 202111172359
Blood Product Expiration Date: 202111172359
ISSUE DATE / TIME: 202110260752
ISSUE DATE / TIME: 202110260752
Unit Type and Rh: 6200
Unit Type and Rh: 6200

## 2020-02-07 ENCOUNTER — Ambulatory Visit (HOSPITAL_COMMUNITY)
Admission: RE | Admit: 2020-02-07 | Discharge: 2020-02-07 | Disposition: A | Discharge status: Home / Self Care | Payer: Medicare Other | Source: Ambulatory Visit | Attending: Physician Assistant | Admitting: Physician Assistant

## 2020-02-07 DIAGNOSIS — I2510 Atherosclerotic heart disease of native coronary artery without angina pectoris: Secondary | ICD-10-CM | POA: Diagnosis not present

## 2020-02-07 DIAGNOSIS — C3490 Malignant neoplasm of unspecified part of unspecified bronchus or lung: Secondary | ICD-10-CM | POA: Insufficient documentation

## 2020-02-07 DIAGNOSIS — J432 Centrilobular emphysema: Secondary | ICD-10-CM | POA: Diagnosis not present

## 2020-02-07 DIAGNOSIS — S2242XA Multiple fractures of ribs, left side, initial encounter for closed fracture: Secondary | ICD-10-CM | POA: Diagnosis not present

## 2020-02-07 NOTE — Progress Notes (Signed)
ALPHONSUS, DOYEL ([id number]) Visit Report for 02/06/2020 Arrival Information Details Patient Name: [name], [name] Date of Service: 02/06/2020 11:00 AM Medical Record Number: [id number] Patient Account Number: [id number] Date of Birth/Sex: Mar 25, 1946 (74 y.o. M) Treating RN: Cornell Barman Primary Care Antoni Stefan: [name] Other Clinician: Referring Naleigha Raimondi: [name] Treating Jinnifer Montejano/Extender: Tito Dine in Treatment: 9 Visit Information History Since Last Visit Added or deleted any medications: No Patient Arrived: Walker Any new allergies or adverse reactions: No Arrival Time: 10:56 Had a fall or experienced change in No Accompanied By: self activities of daily living that may affect Transfer Assistance: None risk of falls: Patient Identification Verified: Yes Signs or symptoms of abuse/neglect since last visito No Secondary Verification Process Completed: Yes Hospitalized since last visit: No Patient Requires Transmission-Based Precautions: No Implantable device outside of the clinic excluding No cellular tissue based products placed in the center since last visit: Has Dressing in Place as Prescribed: Yes Pain Present Now: No Electronic Signature(s) Signed: 02/06/2020 3:14:44 PM By: Lorine Bears RCP, RRT, CHT Entered By: Lorine Bears on 02/06/2020 10:57:21 [name] ([id number]) -------------------------------------------------------------------------------- Encounter Discharge Information Details Patient Name: [name] Date of Service: 02/06/2020 11:00 AM Medical Record Number: [id number] Patient Account Number: [id number] Date of Birth/Sex: November 03, 1945 (74 y.o. M) Treating RN: Cornell Barman Primary Care Laureen Frederic: [name] Other Clinician: Referring Ethylene Reznick: [name] Treating Adem Costlow/Extender: Tito Dine in Treatment: 9 Encounter Discharge Information Items Post  Procedure Vitals Discharge Condition: Stable Temperature (F): 98.7 Ambulatory Status: Walker Pulse (bpm): 132 Discharge Destination: Home Respiratory Rate (breaths/min): 16 Transportation: Private Auto Blood Pressure (mmHg): 177/92 Accompanied By: self Schedule Follow-up Appointment: Yes Clinical Summary of Care: Electronic Signature(s) Signed: 02/06/2020 5:48:54 PM By: Gretta Cool, BSN, RN, CWS, Kim RN, BSN Entered By: Gretta Cool, BSN, RN, CWS, Kim on 02/06/2020 11:20:05 [name] ([id number]) -------------------------------------------------------------------------------- Lower Extremity Assessment Details Patient Name: [name] Date of Service: 02/06/2020 11:00 AM Medical Record Number: [id number] Patient Account Number: [id number] Date of Birth/Sex: 1945/07/03 (74 y.o. M) Treating RN: Cornell Barman Primary Care Gust Eugene: [name] Other Clinician: Referring Loisann Roach: [name] Treating Frances Joynt/Extender: Tito Dine in Treatment: 9 Vascular Assessment Pulses: Dorsalis Pedis Palpable: [Left:Yes] [Right:Yes] Electronic Signature(s) Signed: 02/06/2020 5:48:54 PM By: Gretta Cool, BSN, RN, CWS, Kim RN, BSN Entered By: Gretta Cool, BSN, RN, CWS, Kim on 02/06/2020 11:08:02 [name] ([id number]) -------------------------------------------------------------------------------- Multi Wound Chart Details Patient Name: [name] Date of Service: 02/06/2020 11:00 AM Medical Record Number: [id number] Patient Account Number: [id number] Date of Birth/Sex: 1945-10-18 (74 y.o. M) Treating RN: Cornell Barman Primary Care Malonie Tatum: [name] Other Clinician: Referring Jonah Nestle: [name] Treating Freedom Lopezperez/Extender: Tito Dine in Treatment: 9 Vital Signs Height(in): 71 Pulse(bpm): 132 Weight(lbs): 190 Blood Pressure(mmHg): 177/92 Body Mass Index(BMI): 26 Temperature(F): 98.7 Respiratory Rate(breaths/min): 16 Photos:  [N/A:N/A] Wound Location: Left, Lateral, Plantar Foot Right, Dorsal Foot N/A Wounding Event: Surgical Injury Pressure Injury N/A Primary Etiology: Diabetic Wound/Ulcer of the Lower Diabetic Wound/Ulcer of the Lower N/A Extremity Extremity Secondary Etiology: N/A Pressure Ulcer N/A Comorbid History: Cataracts, Middle ear problems, Cataracts, Middle ear problems, N/A Sleep Apnea, Hypertension, Type II Sleep Apnea, Hypertension, Type II Diabetes, Gout, Osteoarthritis, Diabetes, Gout, Osteoarthritis, Neuropathy, Received Neuropathy, Received Chemotherapy Chemotherapy Date Acquired: 11/23/2019 01/23/2020 N/A Weeks of Treatment: 9 0 N/A Wound Status: Open Open N/A Measurements L x W x D (cm) 1x1.2x0.1 0.8x0.5x0.1 N/A Area (cm) : 0.942 0.314 N/A Volume (cm) : 0.094 0.031 N/A %  Reduction in Area: 85.20% 0.00% N/A % Reduction in Volume: 97.00% 0.00% N/A Classification: Grade 3 Unable to visualize wound bed N/A Exudate Amount: Medium None Present N/A Exudate Type: Serosanguineous N/A N/A Exudate Color: red, brown N/A N/A Wound Margin: Flat and Intact Distinct, outline attached N/A Granulation Amount: Small (1-33%) None Present (0%) N/A Granulation Quality: Red, Pink, Hyper-granulation N/A N/A Necrotic Amount: Large (67-100%) Large (67-100%) N/A Necrotic Tissue: Adherent Slough Eschar N/A Exposed Structures: Fat Layer (Subcutaneous Tissue): Fascia: No N/A Yes Fat Layer (Subcutaneous Tissue): Fascia: No No Tendon: No Tendon: No Muscle: No Muscle: No Joint: No Joint: No Bone: No Bone: No Epithelialization: Small (1-33%) N/A N/A Debridement: N/A Debridement - Excisional N/A Pre-procedure Verification/Time N/A 11:11 N/A Out Taken: Tissue Debrided: N/A Necrotic/Eschar, Subcutaneous, N/A Slough Level: N/A Skin/Subcutaneous Tissue N/A Debridement Area (sq cm): N/A 0.4 N/A Instrument: N/A Curette N/A [name], [name] ([id number]) Bleeding: N/A Minimum N/A Hemostasis Achieved: N/A  Pressure N/A Debridement Treatment N/A Procedure was tolerated well N/A Response: Post Debridement N/A 0.8x0.5x0.2 N/A Measurements L x W x D (cm) Post Debridement Volume: N/A 0.063 N/A (cm) Procedures Performed: N/A Debridement N/A Treatment Notes Wound #7 (Left, Lateral, Plantar Foot) Notes Hblue to left lateral plantar foot and right dorsal foot, bordered foam dressing Wound #9 (Right, Dorsal Foot) Notes Hblue to left lateral plantar foot and right dorsal foot, bordered foam dressing Electronic Signature(s) Signed: 02/07/2020 7:28:08 AM By: Linton Ham MD Entered By: Linton Ham on 02/06/2020 11:19:21 [name] ([id number]) -------------------------------------------------------------------------------- Multi-Disciplinary Care Plan Details Patient Name: [name] Date of Service: 02/06/2020 11:00 AM Medical Record Number: [id number] Patient Account Number: [id number] Date of Birth/Sex: 12-Nov-1945 (74 y.o. M) Treating RN: Cornell Barman Primary Care Alrick Cubbage: [name] Other Clinician: Referring Corbett Moulder: [name] Treating Hiroshi Krummel/Extender: Tito Dine in Treatment: 9 Active Inactive Necrotic Tissue Nursing Diagnoses: Impaired tissue integrity related to necrotic/devitalized tissue Goals: Necrotic/devitalized tissue will be minimized in the wound bed Date Initiated: 12/03/2019 Target Resolution Date: 12/07/2019 Goal Status: Active Interventions: Assess patient pain level pre-, during and post procedure and prior to discharge Treatment Activities: Apply topical anesthetic as ordered : 12/03/2019 Notes: Orientation to the Wound Care Program Nursing Diagnoses: Knowledge deficit related to the wound healing center program Goals: Patient/caregiver will verbalize understanding of the [place] Date Initiated: 12/03/2019 Target Resolution Date: 12/07/2019 Goal Status: Active Interventions: Provide education on  orientation to the wound center Notes: Pain, Acute or Chronic Nursing Diagnoses: Pain Management - Cyclic Acute (Dressing Change Related) Goals: Patient will verbalize adequate pain control and receive pain control interventions during procedures as needed Date Initiated: 12/03/2019 Target Resolution Date: 12/07/2019 Goal Status: Active Interventions: Assess comfort goal upon admission Treatment Activities: Administer pain control measures as ordered : 12/03/2019 Notes: Wound/Skin Impairment Nursing Diagnoses: Impaired tissue integrity [name], [name] ([id number]) Goals: Ulcer/skin breakdown will have a volume reduction of 30% by week 4 Date Initiated: 12/03/2019 Target Resolution Date: 01/03/2020 Goal Status: Active Ulcer/skin breakdown will have a volume reduction of 50% by week 8 Date Initiated: 12/03/2019 Target Resolution Date: 02/02/2020 Goal Status: Active Ulcer/skin breakdown will have a volume reduction of 80% by week 12 Date Initiated: 12/03/2019 Target Resolution Date: 03/04/2020 Goal Status: Active Interventions: Assess ulceration(s) every visit Treatment Activities: Referred to DME Willey Due for dressing supplies : 12/03/2019 Topical wound management initiated : 12/03/2019 Notes: Electronic Signature(s) Signed: 02/06/2020 5:48:54 PM By: Gretta Cool, BSN, RN, CWS, Kim RN, BSN Entered By: Gretta Cool, BSN, RN, CWS, Kim on  02/06/2020 11:10:55 [name], [name] ([id number]) -------------------------------------------------------------------------------- Pain Assessment Details Patient Name: [name], [name] Date of Service: 02/06/2020 11:00 AM Medical Record Number: [id number] Patient Account Number: [id number] Date of Birth/Sex: 11-Nov-1945 (74 y.o. M) Treating RN: Cornell Barman Primary Care Nysha Koplin: [name] Other Clinician: Referring Josef Tourigny: [name] Treating Ameliya Nicotra/Extender: Tito Dine in Treatment: 9 Active Problems Location of Pain Severity  and Description of Pain Patient Has Paino No Site Locations Pain Management and Medication Current Pain Management: Electronic Signature(s) Signed: 02/06/2020 5:48:54 PM By: Gretta Cool, BSN, RN, CWS, Kim RN, BSN Entered By: Gretta Cool, BSN, RN, CWS, Kim on 02/06/2020 11:01:25 [name] ([id number]) -------------------------------------------------------------------------------- Patient/Caregiver Education Details Patient Name: [name] Date of Service: 02/06/2020 11:00 AM Medical Record Number: [id number] Patient Account Number: [id number] Date of Birth/Gender: Nov 18, 1945 (74 y.o. M) Treating RN: Cornell Barman Primary Care Physician: [name] Other Clinician: Referring Physician: [name] Treating Physician/Extender: Tito Dine in Treatment: 9 Education Assessment Education Provided To: Patient Education Topics Provided Wound/Skin Impairment: Handouts: Caring for Your Ulcer Methods: Demonstration, Explain/Verbal Responses: State content correctly Electronic Signature(s) Signed: 02/06/2020 5:48:54 PM By: Gretta Cool, BSN, RN, CWS, Kim RN, BSN Entered By: Gretta Cool, BSN, RN, CWS, Kim on 02/06/2020 11:13:50 [name] ([id number]) -------------------------------------------------------------------------------- Wound Assessment Details Patient Name: [name] Date of Service: 02/06/2020 11:00 AM Medical Record Number: [id number] Patient Account Number: [id number] Date of Birth/Sex: [birth year] (74 y.o. M) Treating RN: Cornell Barman Primary Care Olar Santini: [name] Other Clinician: Referring Kimaya Whitlatch: [name] Treating Ameerah Huffstetler/Extender: Tito Dine in Treatment: 9 Wound Status Wound Number: 7 Primary Diabetic Wound/Ulcer of the Lower Extremity Etiology: Wound Location: Left, Lateral, Plantar Foot Wound Open Wounding Event: Surgical Injury Status: Date Acquired: 11/23/2019 Comorbid Cataracts, Middle ear problems,  Sleep Apnea, Weeks Of Treatment: 9 History: Hypertension, Type II Diabetes, Gout, Osteoarthritis, Clustered Wound: No Neuropathy, Received Chemotherapy Photos Wound Measurements Length: (cm) 1 Width: (cm) 1.2 Depth: (cm) 0.1 Area: (cm) 0.942 Volume: (cm) 0.094 % Reduction in Area: 85.2% % Reduction in Volume: 97% Epithelialization: Small (1-33%) Wound Description Classification: Grade 3 Wound Margin: Flat and Intact Exudate Amount: Medium Exudate Type: Serosanguineous Exudate Color: red, brown Foul Odor After Cleansing: No Slough/Fibrino Yes Wound Bed Granulation Amount: Small (1-33%) Exposed Structure Granulation Quality: Red, Pink, Hyper-granulation Fascia Exposed: No Necrotic Amount: Large (67-100%) Fat Layer (Subcutaneous Tissue) Exposed: Yes Necrotic Quality: Adherent Slough Tendon Exposed: No Muscle Exposed: No Joint Exposed: No Bone Exposed: No Treatment Notes Wound #7 (Left, Lateral, Plantar Foot) Notes Hblue to left lateral plantar foot and right dorsal foot, bordered foam dressing Electronic Signature(s) [name], [name] ([id number]) Signed: 02/06/2020 5:48:54 PM By: Gretta Cool, BSN, RN, CWS, Kim RN, BSN Entered By: Gretta Cool, BSN, RN, CWS, Kim on 02/06/2020 11:05:58 [name] ([id number]) -------------------------------------------------------------------------------- Wound Assessment Details Patient Name: [name] Date of Service: 02/06/2020 11:00 AM Medical Record Number: [id number] Patient Account Number: [id number] Date of Birth/Sex: 09/21/1945 (74 y.o. M) Treating RN: Cornell Barman Primary Care Aahan Marques: [name] Other Clinician: Referring Taydon Nasworthy: [name] Treating Tritia Endo/Extender: Tito Dine in Treatment: 9 Wound Status Wound Number: 9 Primary Diabetic Wound/Ulcer of the Lower Extremity Etiology: Wound Location: Right, Dorsal Foot Secondary Pressure Ulcer Wounding Event: Pressure Injury Etiology: Date  Acquired: 01/23/2020 Wound Open Weeks Of Treatment: 0 Status: Clustered Wound: No Comorbid Cataracts, Middle ear problems, Sleep Apnea, History: Hypertension, Type II Diabetes, Gout, Osteoarthritis, Neuropathy, Received Chemotherapy Photos Wound Measurements Length: (cm) 0.8 Width: (cm) 0.5 Depth: (  cm) 0.1 Area: (cm) 0.314 Volume: (cm) 0.031 % Reduction in Area: 0% % Reduction in Volume: 0% Tunneling: No Undermining: No Wound Description Classification: Unable to visualize wound bed Wound Margin: Distinct, outline attached Exudate Amount: None Present Foul Odor After Cleansing: No Slough/Fibrino No Wound Bed Granulation Amount: None Present (0%) Exposed Structure Necrotic Amount: Large (67-100%) Fascia Exposed: No Necrotic Quality: Eschar Fat Layer (Subcutaneous Tissue) Exposed: No Tendon Exposed: No Muscle Exposed: No Joint Exposed: No Bone Exposed: No Treatment Notes Wound #9 (Right, Dorsal Foot) Notes Hblue to left lateral plantar foot and right dorsal foot, bordered foam dressing Electronic Signature(s) [name], [name] ([id number]) Signed: 02/06/2020 5:48:54 PM By: Gretta Cool, BSN, RN, CWS, Kim RN, BSN Entered By: Gretta Cool, BSN, RN, CWS, Kim on 02/06/2020 11:06:48 [name] ([id number]) -------------------------------------------------------------------------------- [place] Details Patient Name: [name] Date of Service: 02/06/2020 11:00 AM Medical Record Number: [id number] Patient Account Number: [id number] Date of Birth/Sex: 03-Mar-1946 (74 y.o. M) Treating RN: Cornell Barman Primary Care Travonta Gill: [name] Other Clinician: Referring Davidmichael Zarazua: [name] Treating Milla Wahlberg/Extender: Tito Dine in Treatment: 9 Vital Signs Time Taken: 10:58 Temperature (F): 98.7 Height (in): 71 Pulse (bpm): 132 Weight (lbs): 190 Respiratory Rate (breaths/min): 16 Body Mass Index (BMI): 26.5 Blood Pressure (mmHg): 177/92 Reference  Range: 80 - 120 mg / dl Electronic Signature(s) Signed: 02/06/2020 3:14:44 PM By: Lorine Bears RCP, RRT, CHT Entered By: Lorine Bears on 02/06/2020 10:59:12

## 2020-02-07 NOTE — Progress Notes (Signed)
MILLION, MAHARAJ ([id number]) Visit Report for 02/06/2020 Debridement Details Patient Name: [name], [name] Date of Service: 02/06/2020 11:00 AM Medical Record Number: [id number] Patient Account Number: [id number] Date of Birth/Sex: 1945-10-22 (74 y.o. M) Treating RN: Cornell Barman Primary Care Provider: [name] Other Clinician: Referring Provider: [name] Treating Provider/Extender: Tito Dine in Treatment: 9 Debridement Performed for Wound #9 Right,Dorsal Foot Assessment: Performed By: Physician Ricard Dillon, MD Debridement Type: Debridement Severity of Tissue Pre Debridement: Fat layer exposed Level of Consciousness (Pre- Awake and Alert procedure): Pre-procedure Verification/Time Out Yes - 11:11 Taken: Total Area Debrided (L x W): 0.8 (cm) x 0.5 (cm) = 0.4 (cm) Tissue and other material Viable, Non-Viable, Eschar, Slough, Subcutaneous, Slough debrided: Level: Skin/Subcutaneous Tissue Debridement Description: Excisional Instrument: Curette Bleeding: Minimum Hemostasis Achieved: Pressure Response to Treatment: Procedure was tolerated well Level of Consciousness (Post- Awake and Alert procedure): Post Debridement Measurements of Total Wound Length: (cm) 0.8 Width: (cm) 0.5 Depth: (cm) 0.2 Volume: (cm) 0.063 Character of Wound/Ulcer Post Debridement: Stable Severity of Tissue Post Debridement: Fat layer exposed Post Procedure Diagnosis Same as Pre-procedure Electronic Signature(s) Signed: 02/06/2020 5:48:54 PM By: Gretta Cool, BSN, RN, CWS, Kim RN, BSN Signed: 02/07/2020 7:28:08 AM By: Linton Ham MD Entered By: Linton Ham on 02/06/2020 11:19:53 [name] ([id number]) -------------------------------------------------------------------------------- HPI Details Patient Name: [name] Date of Service: 02/06/2020 11:00 AM Medical Record Number: [id number] Patient Account Number: [id number] Date of Birth/Sex:  1946-01-25 (74 y.o. M) Treating RN: Cornell Barman Primary Care Provider: [name] Other Clinician: Referring Provider: [name] Treating Provider/Extender: Tito Dine in Treatment: 9 History of Present Illness HPI Description: 10/04/16 on evaluation today patient presents for initial evaluation concerning a laceration of the right wrist and hand on the bowler aspect. He tells me that he fell into glass following a medication change in regard to his diabetes. He initially went to [facility] where she was sutured and subsequently his daughter who is a Marine scientist removed the teachers at the appropriate time. Unfortunately the wound has done poorly and dehissed which has caused him some trouble including infection. He has been on antibiotics both topically and orally though the wound continues to show signs of necrotic tissue according to notes which is what he was sent to [place] for evaluation concerning. He does have diabetes and unfortunately this is severely uncontrolled. He does see an endocrinologist and is working on this. 10/11/2016 -- the patient's notes from the ER were reviewed and he had his suturing done on 09/17/2016 and had necrotic skin flaps which had to be trimmed the last visit he was here. He has managed to get his Santyl ointment and has been using this appropriately. 10/21/16 patient's wound appears to be doing very well on evaluation today. He still has some Slough covering the wound bed but this is nowhere near as severe as when i saw this initially two weeks ago nor even my review of his pictures last week. I'm pleased with how this is progressing. Patient's wound over the right form appears to be doing fairly well on evaluation today. There is no evidence of infection and he has a small area at the crease of where she is wrist extends and flexes that is still open and appears to be having difficulty due to the fact that it is cracking open. I think this is  something that may be controlled with moisture control that is keeping the wound a little bit more moist.  Nonetheless this is a tiny region compared to the initial wound and overall appears to be doing very well. No fevers, chills, nausea, or vomiting noted at this time. 11/25/16 on evaluation today patient appears to be doing well in regard to his right wrist ulcer. In fact this appears to be completely healed he is having no discomfort. He is pleased with how this is progressed. Readmission: 06/24/17 on evaluation today patient appears back in our clinic regarding issues that he has been having with his right ankle on the lateral malleolus as well is in the dorsal surface of his left foot. Both have been going on for several weeks unfortunately. He tells me that initially this was being managed by Dr. Edilia Bo and I did review the notes from Dr. Edilia Bo in epic. Patient was placed on amoxicillin initially and then subsequently Keflex following. He has been using triple antibiotic ointment over-the-counter along with a Band-Aid for the wound currently. He did not have any Santyl remaining. His most recent hemoglobin A1c which was just two days ago was 10.3. Fortunately he seems to have excellent blood flow in the bilateral lower extremities with his left ABI being 0.95 in the right ABI 1.01. Patient does have some pain in regard to each of these ulcerations but he tells me it's not nearly as severe as the hand wound that I help treat earlier over the summer. This was in 2018. 07/15/17 on evaluation today patient's lower surety ulcer is actually both appear to be doing fairly well at this point. He does have a little bit of skin overlapping the ulcer on the right lateral ankle this was debrided away today just with saline and gauze without complication patient had no discomfort or pain. Otherwise patient's left foot ulcer does appear to be showing signs of improvement he does seem to have a right great  toe. Nikki and noted at this point in time. I think that this does need to be managed at this point. No fevers, chills, nausea, or vomiting noted at this time. 07/22/17 on evaluation today patient's wounds in regard to his lower extremities appear to be doing somewhat better. His right ankle wound in fact appears to likely be completely closed. The left dorsal foot ulcer though still open does appear to be a little bit smaller and does also seem to be making good progress. He has not been having problems with the treatment at this point in the general he does not seem to show as much erythema surrounding the wound bed which is good news. He is not having significant discomfort. 08/12/17 on evaluation today patients wounds of both appeared to be doing better in fact they both appear to be completely healed. Overall I'm very pleased with how things have progressed over the past week even more than what I expected. Obviously this is good news and he is happy about this. He did have a fall when going to his orthopedic doctor recently fortunately he does not appear to have injured anything severely but nonetheless does have a couple of bumps and bruises nothing significant at this time but he needs to be seen and wound care for. He may have broken his left wrist he is following up with orthopedics in that regard. Readmission: 10/24/17 on evaluation today patient presents for reevaluation after having been discharged Aug 12, 2017. His wound was healed at that point although he states that it has been somewhat discolored since that time. He has not experienced the drainage  as far as the wound is concerned but the fact that it was still discolored been greater than two months after the fact made him want to come in for reevaluation. Upon inspection today the patient has no pain although he does have neuropathy. He does have a discolored region of the dorsal surface of the left foot which was the site where the  ulcer was that we previously treated. The good news is he does not again have any discomfort although obviously I do believe this may be some scar tissue and just discoloration in that regard. Again he hasn't had any drainage or discharge since I last saw him and at this point I see no evidence of that either this area shows complete epithelialization. In general I do not feel like there's any significant issue at this point in that regard. 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 He has been in this clinic before in 2018 with a laceration on his right hand and then again in 2019 with an area over the right lateral malleolus. 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 He states that the wound is been there for the last week or KNOX, CERVI. ([id number]) 2. It is been uncomfortable to walk on. The man is an active man having to look after an elderly mother. Past medical history, type 2 diabetes with polyneuropathy with a recent hemoglobin A1c in September 2011 0.3, lumbar disc disease, hypergonadism., Left foot drop and gout. We did not repeat his ABIs in the clinic quoting values from July 2019 of 0.95 and 1.01. He is not felt to have significant PAD 02/19/2019 on evaluation today patient actually appears to be doing better with regard to his foot wound based on what I am seeing currently. He was seen by Dr. Dellia Nims on readmission back into the clinic last week when I was off. The good news is the patient has been tolerating the dressing changes without any complication in fact has not really had much in the way of drainage. He does have an AFO brace which she believes is actually pushing on this area as well and has contributed to the issue. Fortunately there is no signs of active infection at this point. His ABIs appear to be  well. 03/05/2019 on evaluation today patient appears to be doing well in regard to his foot ulcer. He has been tolerating the dressing changes without complication. Fortunately there is no signs of active infection. The wound is measuring somewhat smaller and overall I feel like he is making good progress at this point. 03/12/2019 on evaluation today patient actually appears to be doing quite well with regard to his foot ulcer. The wound is looking better the measurement is not significantly better at this point unfortunately but nonetheless again the overall appearance is improving I think he is making some progress. I still think the big issue here is foot drop and again the patient wants to see if potentially he could get a brace to help with this. Subsequently I think that we can definitely make a referral to [facility] to see if they can help [place] in this regard. He is in agreement with that plan. 03/26/2019 on evaluation today patient appears to be doing more poorly in regard to his foot ulcer at this time. He did see podiatry in order  to see about getting a brace but unfortunately he tells me that Medicare is not likely to pay for one for him as he had 1 I guess 8 months ago. However this one that apparently goes in his shoes that does not seem to be working out well for him. Unfortunately there are signs of active infection at this time. No fevers, chills, nausea, vomiting, or diarrhea. Systemically. The patient has erythema on the dorsal surface of his foot that tracks from the lateral portion of his foot and again he does have a blister around the wound as well which is worse compared to previous. Overall I am concerned. 04/02/2019 on evaluation today patient appears to be doing about the same with regard to his wound. Unfortunately there is no signs of significant improvement although I do think there is no signs of infection which is good news. His drainage is also slowed down tremendously.  I think at this point he would benefit from the initiation of a total contact cast. The only concern I have is that over his balance. I explained to the patient that if we were to initiate the cast I think it would be greatly beneficial for him but he would need to ensure not to walk at all unless he uses a walker he actually has 2 walkers one for inside his home and one that he keeps out in the car. For that reason he would be covered in that regard. I just do not want him to have any accidents and fall with the cast. 12/28-Patient comes in 1 week out a day early for his appointment he was in the hospital for 2 days for uncontrolled hyperglycemia and required management of high blood sugars, with some adjustments in his home regimen of insulin, patient stubbed his right great toe today and put a Band-Aid on it, he is here for his left lateral foot ulcer and we are using silver alginate dressing, he was intact total contact cast with difficulty with balance to the extent that that had to be discontinued 04/16/2019 on evaluation today patient appears to be doing better with regard to the overall appearance of his wound size. This is good news. We had attempted a total contact cast but unfortunately he had some falling issues that the family contacted [place] about we took the cast off we did not put it back on. Nonetheless it ended up that just a couple days later he was in the hospital due to elevations in his blood sugar which went to around 500 and even a little higher. Subsequently he was admitted to the hospital and was in the hospital for a total of 3 days and that included Christmas day. Fortunately there got his blood sugar under control he tells me that this is running about 200 now which is much better news. There does not appear to be any signs of active infection at this time. No fevers, chills, nausea, vomiting, or diarrhea. 04/30/2019 on evaluation today patient actually appears to be doing  slightly worse compared to previous as far as the overall size of his wound is concerned. There also is some need for sharp debridement today as well. Fortunately there is no evidence of active infection which is good news. No fevers, chills, nausea, vomiting, or diarrhea. 05/07/2019 upon evaluation today patient appears to be doing excellent in regard to his plantar foot ulcer. He has been tolerating the dressing changes without complication. Fortunately there is no signs of active infection at  this time. No fevers, chills, nausea, vomiting, or diarrhea. He does tell me that he actually got in touch with [employer] and they feel like they can add diagnosis codes to his orders in order to get an external brace for him as opposed to the one that goes internal to issue. That would be excellent for his foot drop and I did tell him that if there is anything I need to fill out in that regard I will be more than happy to oblige and fill out what is needed for [employer]. 05/14/2019 upon evaluation today patient's original wound site actually appears to be doing much better which is good news. Fortunately there is no signs of active infection at this time. No fevers, chills, nausea, vomiting, or diarrhea. With that being said the patient unfortunately is continuing to have issues with rubbing he tells me that the felt slid on his foot although he did not realize it until today. Nonetheless I think this did cause a blister around the edge of the wound and he does an open wound that is slightly larger overall in measurement compared to last time mainly due to this blister. Other than that I feel like he is doing quite well. 05/21/2019 upon evaluation today patient appears to be doing excellent in regard to his wound at this time on the plantar foot. He is showing signs of improvement he does have some slough and biofilm noted on the surface of the wound this is going require some sharp debridement today as well.  Fortunately there is no evidence of active infection. No fevers, chills, nausea, vomiting, or diarrhea. 05/28/2019 on evaluation today patient's wound actually appears to be measuring smaller upon initial inspection and I do believe that it is doing well. With that being said he continues to have a blister on the outside/lateral edge of the wound that occurs as a result of rubbing due to his dropfoot currently. Subsequently we have filled out the paperwork for his new external brace but again that does take several weeks to get that completed once everything is returned to back we did send this back last week as far as the paperwork was concerned. With that being said there does not appear to be any signs of active infection at this time. 06/04/2019 on evaluation today patient's wound actually appears to be doing slightly better compared to last week which is good news. He still states that his insurance unfortunately does not seem to want to cover any of his new brace. He is going to just pay for this himself. Nonetheless he is going by there today when he leaves here that is [employer] in [place] that is going to be making this brace for him. 06/18/2019 upon evaluation today patient fortunately does have his new brace which is the external AFO brace with bilateral uprights. Subsequently he seems to be doing much better in fact the wound already appears better. He has not even been using the felt he is just using a dressing over [place] ([id number]) top of the wound and overall I am very pleased with how things seem to be progressing. There is no signs of active infection at this time which is great news and I think that the AFO is good to help him tremendously as far as trying to keep pressure off of the 06/25/2019 upon evaluation today patient seems to be making good progress in regard to his wound. He has been tolerating the dressing changes without complication. Fortunately  there is no signs  of active infection at this time. No fevers, chills, nausea, vomiting, or diarrhea. 07/02/2019 upon evaluation today patient unfortunately had a blistered area on the bottom of his foot tracking more towards the midline of the plantar aspect. He does not seem to have issues with rubbing more laterally that he used to have but nonetheless this is still giving him some trouble and causing problems here. Fortunately there is no signs of active infection at this time. No fevers, chills, nausea, vomiting, or diarrhea. 07/09/2019 upon evaluation today patient appears to be doing well with regard to his plantar foot ulcer. Definitely this is much improved compared to last week's visit which again last week the issue there was that he had an area of callus buildup on the plantar aspect of his foot I was able to clean this away and after effectively doing so the wound seems to be doing much better which is great news. There is no signs of active infection at this time. 07/16/2019 upon evaluation today patient appears to be making some progress in my opinion with regard to his plantar foot ulcer. He has been tolerating the dressing changes without complication. Fortunately there is no evidence of active infection at this time which is great news. 07/23/2019 upon evaluation today patient appears to be doing well with regard to his wounds on the plantar foot. He has been tolerating the dressing changes without complication. These do seem to be healing. 08/06/2019 upon evaluation today patient's wound actually appears to be showing some signs of improvement though he still has a significant amount of callus buildup over this location. That is can require some debridement today. With that being said the actual wound openings seem to be dramatically improved which is great news. There is no signs of active infection at this time. No fevers, chills, nausea, vomiting, or diarrhea. 08/13/2019 upon evaluation today patient  appears to be doing well with regard to his foot ulcer. He has been tolerating the dressing changes without complication. Fortunately there is no signs of active infection at this time. No fevers, chills, nausea, vomiting, or diarrhea. 08/20/2019 upon evaluation today patient appears to be doing excellent in regard to his foot ulcer. I do believe he still showing signs of improvement this is very slow but again I think still pressure getting to the area even with his brace and custom shoe is still part of the main issue with the delay here. Nonetheless I do not see any signs of worsening or infection all of which is good news. 5/20; left lateral foot wound. He wears an AFO brace for foot drop related to his diabetes. The wound is small and clean looking he is using silver collagen. We have apparently tried him in offloading shoe he could not tolerate this. He does not have good balance. 09/11/2019 upon evaluation today patient appears to be doing more poorly in regard to his foot ulcer as compared to last time I saw him. Unfortunately he does tell me as we discussed on Friday that he had pus and blood coming from the area underneath the callus. Unfortunately I think that this is a combination of the callus not being trimmed away enough and potentially the felt pad causing an abnormal rubbing which has happened before which led to this becoming more irritated and likely infected based on what I am seeing today. The patient takes ampicillin all the time. Subsequently he also states that he has been trying for a  couple of days to take the clindamycin of which she had some leftover from previous. With that being said it upset his stomach. The antibiotic that I sent in for him on Friday unfortunately I wrote the wrong number on as far as the number of pills that he needed. With that being said I corrected that this morning but he tells me is not can pick it up as that seems to be making him have trouble with  his stomach being upset at this point. Fortunately there is no signs of active infection at this time systemically. 09/17/2019 upon evaluation today patient appears to be doing better in regard to his foot ulcer. Has been taking ampicillin that he had leftover from a previous prescription. With that being said he did go see his doctor today concerning his diabetes and notes that his hemoglobin A1c was 13 obviously this is elevated and there can be working on that. Also did review the culture result that we finally got back for final well that showed rare Pseudomonas and a few group B strep noted. The Pseudomonas was sensitive to Cipro but again the wound is doing so much better with the ampicillin I think this is probably not a causative organism I do not see anything that really has me concerned as far as infection is concerned today. Therefore I would hold off on prescribing Cipro for the patient and have him discontinue the ampicillin until completion. 09/24/2019 upon evaluation today patient appears to be doing 09/24/2019 upon evaluation today patient appears to be doing well with regard to his foot ulcer. He actually has not enlarged any and seems to be measuring smaller quite significantly at this point. His toe ulcer is also doing somewhat better which is good news. Fortunately there is no evidence of active infection at this time. No fevers, chills, nausea, vomiting, or diarrhea. 10/08/2019 upon evaluation today patient appears to be doing well with regard to his foot ulcer all things considered. Fortunately there is no signs of active infection at this time. No fevers, chills, nausea, vomiting, or diarrhea. Fortunately he states that overall he has been doing much better with regard to not have any blistering on the side of his foot. Unfortunately he did have a fall last night due to the fact that he got up to walk without his brace on and did not have his walker either it was around 4 in the  morning and his mother was calling out. Subsequently he states that the foot drop got him and he ended up following. Nonetheless he states he normally uses his walker I explained he needs to try to make sure not to ever go without the walker. No matter how attempting the situation may be. Readmission: 12/03/2019 upon evaluation today patient presents for follow-up after having had surgery last week on his foot for surgical debridement and then subsequent wound VAC placement. He had gone into the hospital as he was having a significant infection and not feeling well. He is unfortunately since I last saw him also been under the care of the cancer doctors and they have been taking good care of him. He does seem to be in fairly high spirits all things considered. With that being said he has lung cancer he tells me that his back from the radiation feels like it is constantly burned. 12/10/2019 on evaluation today patient appears to be doing well with regard to his wound. I do believe the wound VAC is doing a great  job at helping to manage his drainage and overall health and to granulate in the wound. He has less undermining noted today in fact almost none. Home health is coming out to change the wound VAC on a regular basis Monday, Wednesday, and Friday. 12/24/2019 on evaluation today patient appears to be doing fairly well in regard to the wound on the plantar aspect of his foot. With that being said he does have a significant feeling in of what has been going on with regard to the ulceration which does not have the depth that it did in the past. With that being said I do believe that based on what I am seeing at this point the patient likely does need to take a break from the wound VAC and in fact it appears the wound VAC was actually placed directly on the skin as far as the bridging was concerned causing some irritation of the top of the patient's foot. This has me concerned as well that if were not  careful that could worsen. I do believe a break with the wound VAC however will be fine and then we can see where we go from there. [name], [name] ([id number]) 01/07/2020 upon evaluation today patient actually appears to be doing much better in regard to his wound. He has been tolerating the dressing changes without complication. There is no signs of active infection and overall I feel like he is managing quite nicely. I do believe that we would likely discontinue the [facility] altogether today. 10/13; follow-up visit. Patient has a area on the left lateral plantar foot. He has been using Hydrofera Blue and bordered foam he has home health changing this 3 times a week. He is nonambulatory. He avoids pushing the wheelchair with that part of his foot and instead uses his heel. We have had a nice improvement today and overall surface area 10/27; 2-week follow-up. He has been using Hydrofera Blue and bordered foam on a wound on the left lateral foot. He told me he was nonambulatory last time from foot drop although he walked in the clinic with a walker this week apparently did not have any transportation. Also he has a new wound on the right dorsal medial foot which she says was caused by foot where trauma Electronic Signature(s) Signed: 02/07/2020 7:28:08 AM By: Linton Ham MD Entered By: Linton Ham on 02/06/2020 11:22:23 [name] ([id number]) -------------------------------------------------------------------------------- Physical Exam Details Patient Name: [name] Date of Service: 02/06/2020 11:00 AM Medical Record Number: [id number] Patient Account Number: [id number] Date of Birth/Sex: 1946/04/09 (74 y.o. M) Treating RN: Cornell Barman Primary Care Provider: [name] Other Clinician: Referring Provider: [name] Treating Provider/Extender: Tito Dine in Treatment: 9 Constitutional Patient is hypertensive.. Pulse regular and within target range  for patient.[name] Kitchen Respirations regular, non-labored and within target range.. Temperature is normal and within the target range for the patient.[name] Kitchen appears in no distress. Notes Wound exam; still a very clean wound that is come down in surface area on the plantar left foot. Rims of epithelialization no debridement is required oShe had a new area on the right dorsal foot small area with eschared surface. I used a #3 curette to remove the eschar however I got into necrotic subcutaneous tissue is well. I was able to get this to clean up quite nicely although it was deeper than what I thought. No wound had any evidence of infection Electronic Signature(s) Signed: 02/07/2020 7:28:08 AM By: Linton Ham MD Entered By: Dellia Nims,  Harshini Trent on 02/06/2020 11:23:52 [name], [name] ([id number]) -------------------------------------------------------------------------------- Physician Orders Details Patient Name: [name] Date of Service: 02/06/2020 11:00 AM Medical Record Number: [id number] Patient Account Number: [id number] Date of Birth/Sex: [birth year] (74 y.o. M) Treating RN: Cornell Barman Primary Care Provider: [name] Other Clinician: Referring Provider: [name] Treating Provider/Extender: Tito Dine in Treatment: 9 Verbal / Phone Orders: No Diagnosis Coding Wound Cleansing Wound #7 Left,Lateral,Plantar Foot o Clean wound with Normal Saline. Wound #9 Right,Dorsal Foot o Clean wound with Normal Saline. Anesthetic (add to Medication List) Wound #7 Left,Lateral,Plantar Foot o Topical Lidocaine 4% cream applied to wound bed prior to debridement (In Clinic Only). Wound #9 Right,Dorsal Foot o Topical Lidocaine 4% cream applied to wound bed prior to debridement (In Clinic Only). Primary Wound Dressing Wound #7 Left,Lateral,Plantar Foot o Hydrafera Blue Ready Transfer Wound #9 Right,Dorsal Foot o Hydrafera Blue Ready Transfer Secondary Dressing Wound  #7 Left,Lateral,Plantar Foot o Boardered Foam Dressing Wound #9 Right,Dorsal Foot o Boardered Foam Dressing Dressing Change Frequency Wound #7 Left,Lateral,Plantar Foot o Change Dressing Monday, Wednesday, Friday Wound #9 Right,Dorsal Foot o Change Dressing Monday, Wednesday, Friday Follow-up Appointments Wound #7 Left,Lateral,Plantar Foot o Return Appointment in 2 weeks. Wound #9 Right,Dorsal Foot o Return Appointment in 2 weeks. Edema Control Wound #7 Left,Lateral,Plantar Foot o Elevate legs to the level of the heart and pump ankles as often as possible Wound #9 Right,Dorsal Foot o Elevate legs to the level of the heart and pump ankles as often as possible Off-Loading Wound #7 Left,Lateral,Plantar Foot o Other: - No weight on foot, Wheelchair only Wound #9 Right,Dorsal Foot [name], Dustin E. ([id number]) o Other: - No weight on foot, Wheelchair only Additional Orders / Instructions Wound #7 Left,Lateral,Plantar Foot o Increase protein intake. Wound #9 Right,Dorsal Foot o Increase protein intake. Home Health Wound #7 [place] Visits - [place] Nurse may visit PRN to address patientos wound care needs. o FACE TO FACE ENCOUNTER: MEDICARE and MEDICAID PATIENTS: I certify that this patient is under my care and that I had a face-to-face encounter that meets the physician face-to-face encounter requirements with this patient on this date. The encounter with the patient was in whole or in part for the following MEDICAL CONDITION: (primary reason for [place]) MEDICAL NECESSITY: I certify, that based on my findings, NURSING services are a medically necessary home health service. HOME BOUND STATUS: I certify that my clinical findings support that this patient is homebound (i.e., Due to illness or injury, pt requires aid of supportive devices such as crutches, cane, wheelchairs, walkers, the use of  special transportation or the assistance of another person to leave their place of residence. There is a normal inability to leave the home and doing so requires considerable and taxing effort. Other absences are for medical reasons / religious services and are infrequent or of short duration when for other reasons). o If current dressing causes regression in wound condition, may D/C ordered dressing product/s and apply Normal Saline Moist Dressing daily until next [place] / Other MD appointment. [place] of regression in wound condition at [phone]. o Please direct any NON-WOUND related issues/requests for orders to patient's Primary Care Physician Wound #9 [place] [facility] Nurse may visit PRN to address patientos wound care needs. o FACE TO FACE ENCOUNTER: MEDICARE and MEDICAID PATIENTS: I certify that this patient is under my care  and that I had a face-to-face encounter that meets the physician face-to-face encounter requirements with this patient on this date. The encounter with the patient was in whole or in part for the following MEDICAL CONDITION: (primary reason for [place]) MEDICAL NECESSITY: I certify, that based on my findings, NURSING services are a medically necessary home health service. HOME BOUND STATUS: I certify that my clinical findings support that this patient is homebound (i.e., Due to illness or injury, pt requires aid of supportive devices such as crutches, cane, wheelchairs, walkers, the use of special transportation or the assistance of another person to leave their place of residence. There is a normal inability to leave the home and doing so requires considerable and taxing effort. Other absences are for medical reasons / religious services and are infrequent or of short duration when for other reasons). o If current dressing causes regression in wound  condition, may D/C ordered dressing product/s and apply Normal Saline Moist Dressing daily until next [place] / Other MD appointment. [employer] of regression in wound condition at [phone]. o Please direct any NON-WOUND related issues/requests for orders to patient's Primary Care Physician Electronic Signature(s) Signed: 02/06/2020 5:48:54 PM By: Gretta Cool, BSN, RN, CWS, Kim RN, BSN Signed: 02/07/2020 7:28:08 AM By: Linton Ham MD Entered By: Gretta Cool, BSN, RN, CWS, Kim on 02/06/2020 11:13:21 [name] ([id number]) -------------------------------------------------------------------------------- Problem List Details Patient Name: [name] Date of Service: 02/06/2020 11:00 AM Medical Record Number: [id number] Patient Account Number: [id number] Date of Birth/Sex: 22-Apr-1945 (74 y.o. 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 12/03/2019 No Yes muscle L97.512 Non-pressure chronic ulcer of other part of right foot with fat layer 02/06/2020 No Yes exposed E11.42 Type 2 diabetes mellitus with diabetic polyneuropathy 12/03/2019 No Yes M21.372 Foot drop, left foot 12/03/2019 No Yes Inactive Problems Resolved Problems Electronic Signature(s) Signed: 02/07/2020 7:28:08 AM By: Linton Ham MD Entered By: Linton Ham on 02/06/2020 11:19:10 [name] ([id number]) -------------------------------------------------------------------------------- Progress Note Details Patient Name: [name] Date of Service: 02/06/2020 11:00 AM Medical Record Number:  [id number] Patient Account Number: [id number] Date of Birth/Sex: 1945/06/11 (74 y.o. M) Treating RN: Cornell Barman Primary Care Provider: [name] Other Clinician: Referring Provider: [name] Treating Provider/Extender: Tito Dine in Treatment: 9 Subjective History of Present Illness (HPI) 10/04/16 on evaluation today patient presents for initial evaluation concerning a laceration of the right wrist and hand on the bowler aspect. He tells me that he fell into glass following a medication change in regard to his diabetes. He initially went to [place] [facility] ER where she was sutured and subsequently his daughter who is a Marine scientist removed the teachers at the appropriate time. Unfortunately the wound has done poorly and dehissed which has caused him some trouble including infection. He has been on antibiotics both topically and orally though the wound continues to show signs of necrotic tissue according to notes which is what he was sent to [place] for evaluation concerning. He does have diabetes and unfortunately this is severely uncontrolled. He does see an endocrinologist and is working on this. 10/11/2016 -- the patient's notes from the ER were reviewed and he had his  suturing done on 09/17/2016 and had necrotic skin flaps which had to be trimmed the last visit he was here. He has managed to get his Santyl ointment and has been using this appropriately. 10/21/16 patient's wound appears to be doing very well on evaluation today. He still has some Slough covering the wound bed but this is nowhere near as severe as when i saw this initially two weeks ago nor even my review of his pictures last week. I'm pleased with how this is progressing. Patient's wound over the right form appears to be doing fairly well on evaluation today. There is no evidence of infection and he has a small area at the crease of where she is wrist extends and flexes that is still open and appears to be having  difficulty due to the fact that it is cracking open. I think this is something that may be controlled with moisture control that is keeping the wound a little bit more moist. Nonetheless this is a tiny region compared to the initial wound and overall appears to be doing very well. No fevers, chills, nausea, or vomiting noted at this time. 11/25/16 on evaluation today patient appears to be doing well in regard to his right wrist ulcer. In fact this appears to be completely healed he is having no discomfort. He is pleased with how this is progressed. Readmission: 06/24/17 on evaluation today patient appears back in our clinic regarding issues that he has been having with his right ankle on the lateral malleolus as well is in the dorsal surface of his left foot. Both have been going on for several weeks unfortunately. He tells me that initially this was being managed by Dr. Edilia Bo and I did review the notes from Dr. Edilia Bo in epic. Patient was placed on amoxicillin initially and then subsequently Keflex following. He has been using triple antibiotic ointment over-the-counter along with a Band-Aid for the wound currently. He did not have any Santyl remaining. His most recent hemoglobin A1c which was just two days ago was 10.3. Fortunately he seems to have excellent blood flow in the bilateral lower extremities with his left ABI being 0.95 in the right ABI 1.01. Patient does have some pain in regard to each of these ulcerations but he tells me it's not nearly as severe as the hand wound that I help treat earlier over the summer. This was in 2018. 07/15/17 on evaluation today patient's lower surety ulcer is actually both appear to be doing fairly well at this point. He does have a little bit of skin overlapping the ulcer on the right lateral ankle this was debrided away today just with saline and gauze without complication patient had no discomfort or pain. Otherwise patient's left foot ulcer does appear to  be showing signs of improvement he does seem to have a right great toe. Nikki and noted at this point in time. I think that this does need to be managed at this point. No fevers, chills, nausea, or vomiting noted at this time. 07/22/17 on evaluation today patient's wounds in regard to his lower extremities appear to be doing somewhat better. His right ankle wound in fact appears to likely be completely closed. The left dorsal foot ulcer though still open does appear to be a little bit smaller and does also seem to be making good progress. He has not been having problems with the treatment at this point in the general he does not seem to show as much erythema surrounding the wound  bed which is good news. He is not having significant discomfort. 08/12/17 on evaluation today patients wounds of both appeared to be doing better in fact they both appear to be completely healed. Overall I'm very pleased with how things have progressed over the past week even more than what I expected. Obviously this is good news and he is happy about this. He did have a fall when going to his orthopedic doctor recently fortunately he does not appear to have injured anything severely but nonetheless does have a couple of bumps and bruises nothing significant at this time but he needs to be seen and wound care for. He may have broken his left wrist he is following up with orthopedics in that regard. Readmission: 10/24/17 on evaluation today patient presents for reevaluation after having been discharged Aug 12, 2017. His wound was healed at that point although he states that it has been somewhat discolored since that time. He has not experienced the drainage as far as the wound is concerned but the fact that it was still discolored been greater than two months after the fact made him want to come in for reevaluation. Upon inspection today the patient has no pain although he does have neuropathy. He does have a discolored region of  the dorsal surface of the left foot which was the site where the ulcer was that we previously treated. The good news is he does not again have any discomfort although obviously I do believe this may be some scar tissue and just discoloration in that regard. Again he hasn't had any drainage or discharge since I last saw him and at this point I see no evidence of that either this area shows complete epithelialization. In general I do not feel like there's any significant issue at this point in that regard. 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 He has been in this clinic before in 2018 with a laceration on his right hand and then again in 2019 with an area over the right lateral malleolus. 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 He states that the wound is been there for the last week or [name], [name]. ([id number]) 2. It is been uncomfortable to walk on. The man is an active man having to look after an elderly mother. Past medical history, type 2 diabetes with polyneuropathy with a recent hemoglobin A1c in September 2011 0.3, lumbar disc disease, hypergonadism., Left foot drop and gout. We did not repeat his ABIs in the clinic quoting values from July 2019 of 0.95 and 1.01. He is not felt to have significant PAD 02/19/2019 on evaluation today patient actually appears to be doing better with regard to his foot wound based on what I am seeing currently. He was seen by Dr. Dellia Nims on readmission back into the clinic last week when I was off. The good news is the patient has been tolerating the dressing changes without any complication in fact has not really had much in the way of drainage. He does have an AFO brace which she believes is actually pushing on this area as well and has contributed to the issue. Fortunately there is no  signs of active infection at this point. His ABIs appear to be well. 03/05/2019 on evaluation today patient appears to be doing well in regard to his foot ulcer. He  has been tolerating the dressing changes without complication. Fortunately there is no signs of active infection. The wound is measuring somewhat smaller and overall I feel like he is making good progress at this point. 03/12/2019 on evaluation today patient actually appears to be doing quite well with regard to his foot ulcer. The wound is looking better the measurement is not significantly better at this point unfortunately but nonetheless again the overall appearance is improving I think he is making some progress. I still think the big issue here is foot drop and again the patient wants to see if potentially he could get a brace to help with this. Subsequently I think that we can definitely make a referral to [facility] to see if they can help [place] in this regard. He is in agreement with that plan. 03/26/2019 on evaluation today patient appears to be doing more poorly in regard to his foot ulcer at this time. He did see podiatry in order to see about getting a brace but unfortunately he tells me that Medicare is not likely to pay for one for him as he had 1 I guess 8 months ago. However this one that apparently goes in his shoes that does not seem to be working out well for him. Unfortunately there are signs of active infection at this time. No fevers, chills, nausea, vomiting, or diarrhea. Systemically. The patient has erythema on the dorsal surface of his foot that tracks from the lateral portion of his foot and again he does have a blister around the wound as well which is worse compared to previous. Overall I am concerned. 04/02/2019 on evaluation today patient appears to be doing about the same with regard to his wound. Unfortunately there is no signs of significant improvement although I do think there is no signs of infection  which is good news. His drainage is also slowed down tremendously. I think at this point he would benefit from the initiation of a total contact cast. The only concern I have is that over his balance. I explained to the patient that if we were to initiate the cast I think it would be greatly beneficial for him but he would need to ensure not to walk at all unless he uses a walker he actually has 2 walkers one for inside his home and one that he keeps out in the car. For that reason he would be covered in that regard. I just do not want him to have any accidents and fall with the cast. 12/28-Patient comes in 1 week out a day early for his appointment he was in the hospital for 2 days for uncontrolled hyperglycemia and required management of high blood sugars, with some adjustments in his home regimen of insulin, patient stubbed his right great toe today and put a Band-Aid on it, he is here for his left lateral foot ulcer and we are using silver alginate dressing, he was intact total contact cast with difficulty with balance to the extent that that had to be discontinued 04/16/2019 on evaluation today patient appears to be doing better with regard to the overall appearance of his wound size. This is good news. We had attempted a total contact cast but unfortunately he had some falling issues that the family contacted [place] about we took the cast off we did not put it back on. Nonetheless it ended up that just a couple days later he was in the hospital due to elevations in his blood sugar which went  to around 500 and even a little higher. Subsequently he was admitted to the hospital and was in the hospital for a total of 3 days and that included Christmas day. Fortunately there got his blood sugar under control he tells me that this is running about 200 now which is much better news. There does not appear to be any signs of active infection at this time. No fevers, chills, nausea, vomiting, or  diarrhea. 04/30/2019 on evaluation today patient actually appears to be doing slightly worse compared to previous as far as the overall size of his wound is concerned. There also is some need for sharp debridement today as well. Fortunately there is no evidence of active infection which is good news. No fevers, chills, nausea, vomiting, or diarrhea. 05/07/2019 upon evaluation today patient appears to be doing excellent in regard to his plantar foot ulcer. He has been tolerating the dressing changes without complication. Fortunately there is no signs of active infection at this time. No fevers, chills, nausea, vomiting, or diarrhea. He does tell me that he actually got in touch with [employer] and they feel like they can add diagnosis codes to his orders in order to get an external brace for him as opposed to the one that goes internal to issue. That would be excellent for his foot drop and I did tell him that if there is anything I need to fill out in that regard I will be more than happy to oblige and fill out what is needed for [employer]. 05/14/2019 upon evaluation today patient's original wound site actually appears to be doing much better which is good news. Fortunately there is no signs of active infection at this time. No fevers, chills, nausea, vomiting, or diarrhea. With that being said the patient unfortunately is continuing to have issues with rubbing he tells me that the felt slid on his foot although he did not realize it until today. Nonetheless I think this did cause a blister around the edge of the wound and he does an open wound that is slightly larger overall in measurement compared to last time mainly due to this blister. Other than that I feel like he is doing quite well. 05/21/2019 upon evaluation today patient appears to be doing excellent in regard to his wound at this time on the plantar foot. He is showing signs of improvement he does have some slough and biofilm noted on the surface of  the wound this is going require some sharp debridement today as well. Fortunately there is no evidence of active infection. No fevers, chills, nausea, vomiting, or diarrhea. 05/28/2019 on evaluation today patient's wound actually appears to be measuring smaller upon initial inspection and I do believe that it is doing well. With that being said he continues to have a blister on the outside/lateral edge of the wound that occurs as a result of rubbing due to his dropfoot currently. Subsequently we have filled out the paperwork for his new external brace but again that does take several weeks to get that completed once everything is returned to back we did send this back last week as far as the paperwork was concerned. With that being said there does not appear to be any signs of active infection at this time. 06/04/2019 on evaluation today patient's wound actually appears to be doing slightly better compared to last week which is good news. He still states that his insurance unfortunately does not seem to want to cover any of his new brace. He  is going to just pay for this himself. Nonetheless he is going by there today when he leaves here that is [employer] in [place] that is going to be making this brace for him. 06/18/2019 upon evaluation today patient fortunately does have his new brace which is the external AFO brace with bilateral uprights. Subsequently he seems to be doing much better in fact the wound already appears better. He has not even been using the felt he is just using a dressing over [place] ([id number]) top of the wound and overall I am very pleased with how things seem to be progressing. There is no signs of active infection at this time which is great news and I think that the AFO is good to help him tremendously as far as trying to keep pressure off of the 06/25/2019 upon evaluation today patient seems to be making good progress in regard to his wound. He has been tolerating the  dressing changes without complication. Fortunately there is no signs of active infection at this time. No fevers, chills, nausea, vomiting, or diarrhea. 07/02/2019 upon evaluation today patient unfortunately had a blistered area on the bottom of his foot tracking more towards the midline of the plantar aspect. He does not seem to have issues with rubbing more laterally that he used to have but nonetheless this is still giving him some trouble and causing problems here. Fortunately there is no signs of active infection at this time. No fevers, chills, nausea, vomiting, or diarrhea. 07/09/2019 upon evaluation today patient appears to be doing well with regard to his plantar foot ulcer. Definitely this is much improved compared to last week's visit which again last week the issue there was that he had an area of callus buildup on the plantar aspect of his foot I was able to clean this away and after effectively doing so the wound seems to be doing much better which is great news. There is no signs of active infection at this time. 07/16/2019 upon evaluation today patient appears to be making some progress in my opinion with regard to his plantar foot ulcer. He has been tolerating the dressing changes without complication. Fortunately there is no evidence of active infection at this time which is great news. 07/23/2019 upon evaluation today patient appears to be doing well with regard to his wounds on the plantar foot. He has been tolerating the dressing changes without complication. These do seem to be healing. 08/06/2019 upon evaluation today patient's wound actually appears to be showing some signs of improvement though he still has a significant amount of callus buildup over this location. That is can require some debridement today. With that being said the actual wound openings seem to be dramatically improved which is great news. There is no signs of active infection at this time. No fevers, chills,  nausea, vomiting, or diarrhea. 08/13/2019 upon evaluation today patient appears to be doing well with regard to his foot ulcer. He has been tolerating the dressing changes without complication. Fortunately there is no signs of active infection at this time. No fevers, chills, nausea, vomiting, or diarrhea. 08/20/2019 upon evaluation today patient appears to be doing excellent in regard to his foot ulcer. I do believe he still showing signs of improvement this is very slow but again I think still pressure getting to the area even with his brace and custom shoe is still part of the main issue with the delay here. Nonetheless I do not see any signs of worsening or  infection all of which is good news. 5/20; left lateral foot wound. He wears an AFO brace for foot drop related to his diabetes. The wound is small and clean looking he is using silver collagen. We have apparently tried him in offloading shoe he could not tolerate this. He does not have good balance. 09/11/2019 upon evaluation today patient appears to be doing more poorly in regard to his foot ulcer as compared to last time I saw him. Unfortunately he does tell me as we discussed on Friday that he had pus and blood coming from the area underneath the callus. Unfortunately I think that this is a combination of the callus not being trimmed away enough and potentially the felt pad causing an abnormal rubbing which has happened before which led to this becoming more irritated and likely infected based on what I am seeing today. The patient takes ampicillin all the time. Subsequently he also states that he has been trying for a couple of days to take the clindamycin of which she had some leftover from previous. With that being said it upset his stomach. The antibiotic that I sent in for him on Friday unfortunately I wrote the wrong number on as far as the number of pills that he needed. With that being said I corrected that this morning but he tells me is  not can pick it up as that seems to be making him have trouble with his stomach being upset at this point. Fortunately there is no signs of active infection at this time systemically. 09/17/2019 upon evaluation today patient appears to be doing better in regard to his foot ulcer. Has been taking ampicillin that he had leftover from a previous prescription. With that being said he did go see his doctor today concerning his diabetes and notes that his hemoglobin A1c was 13 obviously this is elevated and there can be working on that. Also did review the culture result that we finally got back for final well that showed rare Pseudomonas and a few group B strep noted. The Pseudomonas was sensitive to Cipro but again the wound is doing so much better with the ampicillin I think this is probably not a causative organism I do not see anything that really has me concerned as far as infection is concerned today. Therefore I would hold off on prescribing Cipro for the patient and have him discontinue the ampicillin until completion. 09/24/2019 upon evaluation today patient appears to be doing 09/24/2019 upon evaluation today patient appears to be doing well with regard to his foot ulcer. He actually has not enlarged any and seems to be measuring smaller quite significantly at this point. His toe ulcer is also doing somewhat better which is good news. Fortunately there is no evidence of active infection at this time. No fevers, chills, nausea, vomiting, or diarrhea. 10/08/2019 upon evaluation today patient appears to be doing well with regard to his foot ulcer all things considered. Fortunately there is no signs of active infection at this time. No fevers, chills, nausea, vomiting, or diarrhea. Fortunately he states that overall he has been doing much better with regard to not have any blistering on the side of his foot. Unfortunately he did have a fall last night due to the fact that he got up to walk without his  brace on and did not have his walker either it was around 4 in the morning and his mother was calling out. Subsequently he states that the foot drop got him  and he ended up following. Nonetheless he states he normally uses his walker I explained he needs to try to make sure not to ever go without the walker. No matter how attempting the situation may be. Readmission: 12/03/2019 upon evaluation today patient presents for follow-up after having had surgery last week on his foot for surgical debridement and then subsequent wound VAC placement. He had gone into the hospital as he was having a significant infection and not feeling well. He is unfortunately since I last saw him also been under the care of the cancer doctors and they have been taking good care of him. He does seem to be in fairly high spirits all things considered. With that being said he has lung cancer he tells me that his back from the radiation feels like it is constantly burned. 12/10/2019 on evaluation today patient appears to be doing well with regard to his wound. I do believe the wound VAC is doing a great job at helping to manage his drainage and overall health and to granulate in the wound. He has less undermining noted today in fact almost none. Home health is coming out to change the wound VAC on a regular basis Monday, Wednesday, and Friday. 12/24/2019 on evaluation today patient appears to be doing fairly well in regard to the wound on the plantar aspect of his foot. With that being said he does have a significant feeling in of what has been going on with regard to the ulceration which does not have the depth that it did in the past. With that being said I do believe that based on what I am seeing at this point the patient likely does need to take a break from the wound VAC and in fact it appears the wound VAC was actually placed directly on the skin as far as the bridging was concerned causing some irritation of the top of the  patient's foot. This has me concerned as well that if were not careful that could worsen. I do believe a break with the wound VAC however will be fine and then we can see where we go from there. [name], [name] ([id number]) 01/07/2020 upon evaluation today patient actually appears to be doing much better in regard to his wound. He has been tolerating the dressing changes without complication. There is no signs of active infection and overall I feel like he is managing quite nicely. I do believe that we would likely discontinue the [facility] altogether today. 10/13; follow-up visit. Patient has a area on the left lateral plantar foot. He has been using Hydrofera Blue and bordered foam he has home health changing this 3 times a week. He is nonambulatory. He avoids pushing the wheelchair with that part of his foot and instead uses his heel. We have had a nice improvement today and overall surface area 10/27; 2-week follow-up. He has been using Hydrofera Blue and bordered foam on a wound on the left lateral foot. He told me he was nonambulatory last time from foot drop although he walked in the clinic with a walker this week apparently did not have any transportation. Also he has a new wound on the right dorsal medial foot which she says was caused by foot where trauma Objective Constitutional Patient is hypertensive.. Pulse regular and within target range for patient.[name] Kitchen Respirations regular, non-labored and within target range.. Temperature is normal and within the target range for the patient.[name] Kitchen appears in no distress. Vitals Time Taken: 10:58 AM, Height: 71  in, Weight: 190 lbs, BMI: 26.5, Temperature: 98.7 F, Pulse: 132 bpm, Respiratory Rate: 16 breaths/min, Blood Pressure: 177/92 mmHg. General Notes: Wound exam; still a very clean wound that is come down in surface area on the plantar left foot. Rims of epithelialization no debridement is required She had a new area on the right dorsal foot small area  with eschared surface. I used a #3 curette to remove the eschar however I got into necrotic subcutaneous tissue is well. I was able to get this to clean up quite nicely although it was deeper than what I thought. No wound had any evidence of infection Integumentary (Hair, Skin) Wound #7 status is Open. Original cause of wound was Surgical Injury. The wound is located on the [place]. The wound measures 1cm length x 1.2cm width x 0.1cm depth; 0.942cm^2 area and 0.094cm^3 volume. There is Fat Layer (Subcutaneous Tissue) exposed. There is a medium amount of serosanguineous drainage noted. The wound margin is flat and intact. There is small (1-33%) red, pink, hyper - granulation within the wound bed. There is a large (67-100%) amount of necrotic tissue within the wound bed including Adherent Slough. Wound #9 status is Open. Original cause of wound was Pressure Injury. The wound is located on the Right,Dorsal Foot. The wound measures 0.8cm length x 0.5cm width x 0.1cm depth; 0.314cm^2 area and 0.031cm^3 volume. There is no tunneling or undermining noted. There is a none present amount of drainage noted. The wound margin is distinct with the outline attached to the wound base. There is no granulation within the wound bed. There is a large (67-100%) amount of necrotic tissue within the wound bed including Eschar. 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 There was a Excisional Skin/Subcutaneous Tissue Debridement with a total area  of 0.4 sq cm performed by Ricard Dillon, MD. With the following instrument(s): Curette to remove Viable and Non-Viable tissue/material. Material removed includes Eschar, Subcutaneous Tissue, and Slough. No specimens were taken. A time out was conducted at 11:11, prior to the start of the procedure. A Minimum amount of bleeding was controlled with Pressure. The procedure was tolerated well. Post Debridement Measurements: 0.8cm length x 0.5cm width x 0.2cm depth; 0.063cm^3 volume. Character of Wound/Ulcer Post Debridement is stable. Severity of Tissue Post Debridement is: Fat layer exposed. Post procedure Diagnosis Wound #9: Same as Pre-Procedure [name], [name] ([id number]) Plan Wound Cleansing: Wound #7 Left,Lateral,Plantar Foot: Clean wound with Normal Saline. Wound #9 Right,Dorsal Foot: Clean wound with Normal Saline. Anesthetic (add to Medication List): Wound #7 Left,Lateral,Plantar Foot: Topical Lidocaine 4% cream applied to wound bed prior to debridement (In Clinic Only). Wound #9 Right,Dorsal Foot: Topical Lidocaine 4% cream applied to wound bed prior to debridement (In Clinic Only). Primary Wound Dressing: Wound #7 Left,Lateral,Plantar Foot: Hydrafera Blue Ready Transfer Wound #9 Right,Dorsal Foot: Hydrafera Blue Ready Transfer Secondary Dressing: Wound #7 Left,Lateral,Plantar Foot: Boardered Foam Dressing Wound #9 Right,Dorsal Foot: Boardered Foam Dressing Dressing Change Frequency: Wound #7 Left,Lateral,Plantar Foot: Change Dressing Monday, Wednesday, Friday Wound #9 Right,Dorsal Foot: Change Dressing Monday, Wednesday, Friday Follow-up Appointments: Wound #  7 Left,Lateral,Plantar Foot: Return Appointment in 2 weeks. Wound #9 Right,Dorsal Foot: Return Appointment in 2 weeks. Edema Control: Wound #7 Left,Lateral,Plantar Foot: Elevate legs to the level of the heart and pump ankles as often as possible Wound #9 Right,Dorsal Foot: Elevate legs to the level of the  heart and pump ankles as often as possible Off-Loading: Wound #7 Left,Lateral,Plantar Foot: Other: - No weight on foot, Wheelchair only Wound #9 Right,Dorsal Foot: Other: - No weight on foot, Wheelchair only Additional Orders / Instructions: Wound #7 Left,Lateral,Plantar Foot: Increase protein intake. Wound #9 Right,Dorsal Foot: Increase protein intake. Home Health: Wound #7 Left,Lateral,Plantar Foot: Continue Home Health Visits - [place] Nurse may visit PRN to address patient s wound care needs. FACE TO FACE ENCOUNTER: MEDICARE and MEDICAID PATIENTS: I certify that this patient is under my care and that I had a face-to-face encounter that meets the physician face-to-face encounter requirements with this patient on this date. The encounter with the patient was in whole or in part for the following MEDICAL CONDITION: (primary reason for [place]) MEDICAL NECESSITY: I certify, that based on my findings, NURSING services are a medically necessary home health service. HOME BOUND STATUS: I certify that my clinical findings support that this patient is homebound (i.e., Due to illness or injury, pt requires aid of supportive devices such as crutches, cane, wheelchairs, walkers, the use of special transportation or the assistance of another person to leave their place of residence. There is a normal inability to leave the home and doing so requires considerable and taxing effort. Other absences are for medical reasons / religious services and are infrequent or of short duration when for other reasons). If current dressing causes regression in wound condition, may D/C ordered dressing product/s and apply Normal Saline Moist Dressing daily until next [place] / Other MD appointment. [place] of regression in wound condition at [phone]. Please direct any NON-WOUND related issues/requests for orders to patient's Primary Care Physician Wound #9  Right,Dorsal Foot: [place] Visits - [place] Nurse may visit PRN to address patient s wound care needs. FACE TO FACE ENCOUNTER: MEDICARE and MEDICAID PATIENTS: I certify that this patient is under my care and that I had a face-to-face encounter that meets the physician face-to-face encounter requirements with this patient on this date. The encounter with the patient was in whole or in part for the following MEDICAL CONDITION: (primary reason for [place]) MEDICAL NECESSITY: I certify, that based on my findings, NURSING services are a medically necessary home health service. HOME BOUND STATUS: I certify that my clinical findings support that this patient is homebound (i.e., Due to illness or injury, pt requires aid of supportive devices such as crutches, cane, wheelchairs, walkers, the use of special transportation or the assistance of another person to leave their place of residence. There is a normal inability to leave the home and doing so requires considerable and taxing effort. Other absences are for medical reasons / religious services and are infrequent or of short [name], [name]. ([id number]) duration when for other reasons). If current dressing causes regression in wound condition, may D/C ordered dressing product/s and apply Normal Saline Moist Dressing daily until next [place] / Other MD appointment. [place] of regression in wound condition at [phone]. Please direct any NON-WOUND related issues/requests for orders to patient's Primary Care Physician 1. I continued with the Hydrofera Blue border foam on the left plantar foot.  2. He tells me that he was nonambulatory although he walked in today with his walker he still spends most of his day in a wheelchair at home. 3. He has a wound from shoe trauma on the right dorsal foot. We will also going to use to this wound as well 4. Concerning thing now that I saw him up in the  walker is how much she is going to be able to offload the left plantar foot wound. We will see how he does in 2 weeks. Friday the wound looked healthy and surface area was better Electronic Signature(s) Signed: 02/07/2020 7:28:08 AM By: Linton Ham MD Entered By: Linton Ham on 02/06/2020 11:26:53 [name] ([id number]) -------------------------------------------------------------------------------- [place] Details Patient Name: [name] Date of Service: 02/06/2020 Medical Record Number: [id number] Patient Account Number: [id number] Date of Birth/Sex: [birth year] (74 y.o. M) Treating RN: Cornell Barman Primary Care Provider: [name] Other Clinician: Referring Provider: [name] Treating Provider/Extender: Tito Dine in Treatment: 9 Diagnosis Coding ICD-10 Codes Code Description E11.621 Type 2 diabetes mellitus with foot ulcer L97.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scle L97.512 Non-pressure chronic ulcer of other part of right foot with fat layer exposed E11.42 Type 2 diabetes mellitus with diabetic polyneuropathy M21.372 Foot drop, left foot Facility Procedures CPT4 Code: [id number] Description: 16553 - DEB SUBQ TISSUE 20 SQ CM/< Modifier: Quantity: 1 CPT4 Code: Description: ICD-10 Diagnosis Description L97.512 Non-pressure chronic ulcer of other part of right foot with fat layer ex Modifier: posed Quantity: Physician Procedures CPT4 Code: [id number] Description: 11042 - WC PHYS SUBQ TISS 20 SQ CM Modifier: Quantity: 1 CPT4 Code: Description: ICD-10 Diagnosis Description L97.512 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

## 2020-02-08 ENCOUNTER — Telehealth: Payer: Self-pay

## 2020-02-08 ENCOUNTER — Other Ambulatory Visit: Payer: Self-pay | Admitting: Internal Medicine

## 2020-02-08 DIAGNOSIS — J45909 Unspecified asthma, uncomplicated: Secondary | ICD-10-CM | POA: Diagnosis not present

## 2020-02-08 DIAGNOSIS — L97429 Non-pressure chronic ulcer of left heel and midfoot with unspecified severity: Secondary | ICD-10-CM | POA: Diagnosis not present

## 2020-02-08 DIAGNOSIS — E11621 Type 2 diabetes mellitus with foot ulcer: Secondary | ICD-10-CM | POA: Diagnosis not present

## 2020-02-08 DIAGNOSIS — C3491 Malignant neoplasm of unspecified part of right bronchus or lung: Secondary | ICD-10-CM | POA: Diagnosis not present

## 2020-02-08 DIAGNOSIS — E041 Nontoxic single thyroid nodule: Secondary | ICD-10-CM | POA: Diagnosis not present

## 2020-02-08 DIAGNOSIS — N1831 Chronic kidney disease, stage 3a: Secondary | ICD-10-CM | POA: Diagnosis not present

## 2020-02-08 DIAGNOSIS — E1122 Type 2 diabetes mellitus with diabetic chronic kidney disease: Secondary | ICD-10-CM | POA: Diagnosis not present

## 2020-02-08 DIAGNOSIS — R000 Tachycardia, unspecified: Secondary | ICD-10-CM | POA: Diagnosis not present

## 2020-02-08 DIAGNOSIS — Z9181 History of falling: Secondary | ICD-10-CM | POA: Diagnosis not present

## 2020-02-08 DIAGNOSIS — I129 Hypertensive chronic kidney disease with stage 1 through stage 4 chronic kidney disease, or unspecified chronic kidney disease: Secondary | ICD-10-CM | POA: Diagnosis not present

## 2020-02-08 DIAGNOSIS — S2232XD Fracture of one rib, left side, subsequent encounter for fracture with routine healing: Secondary | ICD-10-CM | POA: Diagnosis not present

## 2020-02-08 DIAGNOSIS — Z794 Long term (current) use of insulin: Secondary | ICD-10-CM | POA: Diagnosis not present

## 2020-02-08 DIAGNOSIS — E1165 Type 2 diabetes mellitus with hyperglycemia: Secondary | ICD-10-CM | POA: Diagnosis not present

## 2020-02-08 DIAGNOSIS — E1142 Type 2 diabetes mellitus with diabetic polyneuropathy: Secondary | ICD-10-CM | POA: Diagnosis not present

## 2020-02-08 DIAGNOSIS — Z87891 Personal history of nicotine dependence: Secondary | ICD-10-CM | POA: Diagnosis not present

## 2020-02-08 DIAGNOSIS — G4733 Obstructive sleep apnea (adult) (pediatric): Secondary | ICD-10-CM | POA: Diagnosis not present

## 2020-02-08 NOTE — Telephone Encounter (Signed)
Called patient, notified that we had his Ozempic at the office from patient assistance.   Patient verbalized understanding will come to office to pick up.

## 2020-02-11 ENCOUNTER — Inpatient Hospital Stay: Payer: Medicare Other

## 2020-02-11 ENCOUNTER — Other Ambulatory Visit: Payer: Self-pay

## 2020-02-11 ENCOUNTER — Other Ambulatory Visit: Payer: Self-pay | Admitting: Medical Oncology

## 2020-02-11 ENCOUNTER — Inpatient Hospital Stay: Payer: Medicare Other | Attending: Physician Assistant | Admitting: Internal Medicine

## 2020-02-11 ENCOUNTER — Encounter: Payer: Self-pay | Admitting: Internal Medicine

## 2020-02-11 VITALS — BP 153/76 | HR 93 | Temp 97.7°F | Resp 18 | Ht 71.0 in | Wt 187.9 lb

## 2020-02-11 DIAGNOSIS — Z5111 Encounter for antineoplastic chemotherapy: Secondary | ICD-10-CM | POA: Insufficient documentation

## 2020-02-11 DIAGNOSIS — C3490 Malignant neoplasm of unspecified part of unspecified bronchus or lung: Secondary | ICD-10-CM

## 2020-02-11 DIAGNOSIS — R000 Tachycardia, unspecified: Secondary | ICD-10-CM | POA: Diagnosis not present

## 2020-02-11 DIAGNOSIS — Z87891 Personal history of nicotine dependence: Secondary | ICD-10-CM | POA: Diagnosis not present

## 2020-02-11 DIAGNOSIS — C3491 Malignant neoplasm of unspecified part of right bronchus or lung: Secondary | ICD-10-CM

## 2020-02-11 DIAGNOSIS — E119 Type 2 diabetes mellitus without complications: Secondary | ICD-10-CM | POA: Diagnosis not present

## 2020-02-11 DIAGNOSIS — E11621 Type 2 diabetes mellitus with foot ulcer: Secondary | ICD-10-CM | POA: Diagnosis not present

## 2020-02-11 DIAGNOSIS — C3431 Malignant neoplasm of lower lobe, right bronchus or lung: Secondary | ICD-10-CM | POA: Diagnosis not present

## 2020-02-11 DIAGNOSIS — E1142 Type 2 diabetes mellitus with diabetic polyneuropathy: Secondary | ICD-10-CM | POA: Diagnosis not present

## 2020-02-11 DIAGNOSIS — E1165 Type 2 diabetes mellitus with hyperglycemia: Secondary | ICD-10-CM | POA: Diagnosis not present

## 2020-02-11 DIAGNOSIS — S2232XD Fracture of one rib, left side, subsequent encounter for fracture with routine healing: Secondary | ICD-10-CM | POA: Diagnosis not present

## 2020-02-11 DIAGNOSIS — R739 Hyperglycemia, unspecified: Secondary | ICD-10-CM

## 2020-02-11 DIAGNOSIS — D649 Anemia, unspecified: Secondary | ICD-10-CM

## 2020-02-11 DIAGNOSIS — E1122 Type 2 diabetes mellitus with diabetic chronic kidney disease: Secondary | ICD-10-CM | POA: Diagnosis not present

## 2020-02-11 DIAGNOSIS — D6481 Anemia due to antineoplastic chemotherapy: Secondary | ICD-10-CM | POA: Diagnosis not present

## 2020-02-11 DIAGNOSIS — I129 Hypertensive chronic kidney disease with stage 1 through stage 4 chronic kidney disease, or unspecified chronic kidney disease: Secondary | ICD-10-CM | POA: Diagnosis not present

## 2020-02-11 DIAGNOSIS — E041 Nontoxic single thyroid nodule: Secondary | ICD-10-CM | POA: Diagnosis not present

## 2020-02-11 DIAGNOSIS — Z794 Long term (current) use of insulin: Secondary | ICD-10-CM | POA: Diagnosis not present

## 2020-02-11 DIAGNOSIS — N1831 Chronic kidney disease, stage 3a: Secondary | ICD-10-CM | POA: Diagnosis not present

## 2020-02-11 DIAGNOSIS — I10 Essential (primary) hypertension: Secondary | ICD-10-CM

## 2020-02-11 DIAGNOSIS — J45909 Unspecified asthma, uncomplicated: Secondary | ICD-10-CM | POA: Diagnosis not present

## 2020-02-11 DIAGNOSIS — L97429 Non-pressure chronic ulcer of left heel and midfoot with unspecified severity: Secondary | ICD-10-CM | POA: Diagnosis not present

## 2020-02-11 DIAGNOSIS — Z9181 History of falling: Secondary | ICD-10-CM | POA: Diagnosis not present

## 2020-02-11 DIAGNOSIS — G4733 Obstructive sleep apnea (adult) (pediatric): Secondary | ICD-10-CM | POA: Diagnosis not present

## 2020-02-11 LAB — CBC WITH DIFFERENTIAL (CANCER CENTER ONLY)
Abs Immature Granulocytes: 0.1 10*3/uL — ABNORMAL HIGH (ref 0.00–0.07)
Basophils Absolute: 0 10*3/uL (ref 0.0–0.1)
Basophils Relative: 0 %
Eosinophils Absolute: 0 10*3/uL (ref 0.0–0.5)
Eosinophils Relative: 0 %
HCT: 25.4 % — ABNORMAL LOW (ref 39.0–52.0)
Hemoglobin: 8.7 g/dL — ABNORMAL LOW (ref 13.0–17.0)
Immature Granulocytes: 1 %
Lymphocytes Relative: 4 %
Lymphs Abs: 0.5 10*3/uL — ABNORMAL LOW (ref 0.7–4.0)
MCH: 29.7 pg (ref 26.0–34.0)
MCHC: 34.3 g/dL (ref 30.0–36.0)
MCV: 86.7 fL (ref 80.0–100.0)
Monocytes Absolute: 1.1 10*3/uL — ABNORMAL HIGH (ref 0.1–1.0)
Monocytes Relative: 10 %
Neutro Abs: 9.3 10*3/uL — ABNORMAL HIGH (ref 1.7–7.7)
Neutrophils Relative %: 85 %
Platelet Count: 152 10*3/uL (ref 150–400)
RBC: 2.93 MIL/uL — ABNORMAL LOW (ref 4.22–5.81)
RDW: 15.1 % (ref 11.5–15.5)
WBC Count: 11 10*3/uL — ABNORMAL HIGH (ref 4.0–10.5)
nRBC: 0 % (ref 0.0–0.2)

## 2020-02-11 LAB — CMP (CANCER CENTER ONLY)
ALT: 8 U/L (ref 0–44)
AST: 9 U/L — ABNORMAL LOW (ref 15–41)
Albumin: 2.5 g/dL — ABNORMAL LOW (ref 3.5–5.0)
Alkaline Phosphatase: 111 U/L (ref 38–126)
Anion gap: 8 (ref 5–15)
BUN: 14 mg/dL (ref 8–23)
CO2: 30 mmol/L (ref 22–32)
Calcium: 8.8 mg/dL — ABNORMAL LOW (ref 8.9–10.3)
Chloride: 96 mmol/L — ABNORMAL LOW (ref 98–111)
Creatinine: 1.35 mg/dL — ABNORMAL HIGH (ref 0.61–1.24)
GFR, Estimated: 55 mL/min — ABNORMAL LOW (ref >60)
Glucose, Bld: 346 mg/dL — ABNORMAL HIGH (ref 70–99)
Potassium: 4 mmol/L (ref 3.5–5.1)
Sodium: 134 mmol/L — ABNORMAL LOW (ref 135–145)
Total Bilirubin: 0.4 mg/dL (ref 0.3–1.2)
Total Protein: 6.4 g/dL — ABNORMAL LOW (ref 6.5–8.1)

## 2020-02-11 LAB — SAMPLE TO BLOOD BANK

## 2020-02-11 MED ORDER — PALONOSETRON HCL INJECTION 0.25 MG/5ML
INTRAVENOUS | Status: AC
  Filled 2020-02-11: qty 5

## 2020-02-11 MED ORDER — SODIUM CHLORIDE 0.9 % IV SOLN
Freq: Once | INTRAVENOUS | Status: AC
  Filled 2020-02-11: qty 250

## 2020-02-11 MED ORDER — SODIUM CHLORIDE 0.9 % IV SOLN
10.0000 mg | Freq: Once | INTRAVENOUS | Status: AC
  Administered 2020-02-11: 10 mg via INTRAVENOUS
  Filled 2020-02-11: qty 10

## 2020-02-11 MED ORDER — INSULIN ASPART 100 UNIT/ML ~~LOC~~ SOLN
8.0000 [IU] | Freq: Once | SUBCUTANEOUS | Status: AC
  Administered 2020-02-11: 8 [IU] via SUBCUTANEOUS

## 2020-02-11 MED ORDER — HEPARIN SOD (PORK) LOCK FLUSH 100 UNIT/ML IV SOLN
500.0000 [IU] | Freq: Once | INTRAVENOUS | Status: AC | PRN
  Administered 2020-02-11: 500 [IU]
  Filled 2020-02-11: qty 5

## 2020-02-11 MED ORDER — SODIUM CHLORIDE 0.9% FLUSH
10.0000 mL | INTRAVENOUS | Status: DC | PRN
  Administered 2020-02-11: 10 mL
  Filled 2020-02-11: qty 10

## 2020-02-11 MED ORDER — SODIUM CHLORIDE 0.9 % IV SOLN
330.0000 mg | Freq: Once | INTRAVENOUS | Status: AC
  Administered 2020-02-11: 330 mg via INTRAVENOUS
  Filled 2020-02-11: qty 33

## 2020-02-11 MED ORDER — PALONOSETRON HCL INJECTION 0.25 MG/5ML
0.2500 mg | Freq: Once | INTRAVENOUS | Status: AC
  Administered 2020-02-11: 0.25 mg via INTRAVENOUS

## 2020-02-11 MED ORDER — SODIUM CHLORIDE 0.9 % IV SOLN
150.0000 mg | Freq: Once | INTRAVENOUS | Status: AC
  Administered 2020-02-11: 150 mg via INTRAVENOUS
  Filled 2020-02-11: qty 150

## 2020-02-11 MED ORDER — SODIUM CHLORIDE 0.9 % IV SOLN
80.0000 mg/m2 | Freq: Once | INTRAVENOUS | Status: AC
  Administered 2020-02-11: 170 mg via INTRAVENOUS
  Filled 2020-02-11: qty 8.5

## 2020-02-11 MED ORDER — INSULIN ASPART 100 UNIT/ML ~~LOC~~ SOLN
SUBCUTANEOUS | Status: AC
  Filled 2020-02-11: qty 1

## 2020-02-11 NOTE — Progress Notes (Signed)
[place] [phone]   [phone]  OFFICE PROGRESS NOTE  [name], MD [place] [address]  DIAGNOSIS: Limited stage small cell lung cancer.He presented with a large right lower lobe/right hilar mass with obstruction of the right lower lobe bronchus. The patient also has associated bulky right lower paratracheal and subcarinal adenopathy.He was diagnosed in July 2021.  PRIOR THERAPY: 1)weekly concurrent chemoradiation with carboplatin for an AUC of 2 and paclitaxel 45 mg/m.First dose 10/29/2019. Status post 1 cycle. Discontinued due to change in diagnosis.  CURRENT THERAPY: Systemic chemotherapy with carboplatin for an AUC of 5 on day 1 and etoposide 100 mg per metered squared on days 1, 2, and 3 IV every 3 weeks with Neulasta support. First dose expected on 11/12/2019. Status post 4 cycles.  This was concurrent with radiation under the care of Dr. Lisbeth Renshaw for the obstructive lung mass.   INTERVAL HISTORY: [name] 74 y.o. male returns to the clinic today for follow-up visit.  The patient is feeling fine today with no concerning complaints except for fatigue.  He received 2 units of PRBCs transfusion recently secondary to chemotherapy-induced anemia.  The patient denied having any current chest pain, shortness of breath except with exertion with no cough or hemoptysis.  He denied having any nausea, vomiting, diarrhea or constipation.  He denied having any headache or visual changes.  He tolerated the last cycle of his treatment well except for the fatigue.  He had repeat CT scan of the chest performed recently and he is here for evaluation and discussion of his scan results.  MEDICAL HISTORY: Past Medical History:  Diagnosis Date  . Allergic rhinitis due to pollen   . Cancer ([place])   . Chronic kidney disease, stage III (moderate) ([place]) 12/19/2012  . Depression   . Diabetes mellitus ([place])   .  Diverticulosis   . Erectile dysfunction associated with type 2 diabetes mellitus ([place]) 05/28/2015  . Former very heavy cigarette smoker (more than 40 per day) 10/07/2014  . GERD (gastroesophageal reflux disease)   . Gout   . Hyperlipidemia   . Hypertension   . Hypogonadism male 06/14/2012  . Iron deficiency   . Memory loss   . Osteoarthritis   . Personal history of colonic polyps    1996    ALLERGIES:  is allergic to tetracycline.  MEDICATIONS:  Current Outpatient Medications  Medication Sig Dispense Refill  . acetaminophen (QC ACETAMINOPHEN 8HR ARTH PAIN) 650 MG CR tablet Take 650 mg by mouth every 8 (eight) hours as needed for pain.    [name] Kitchen ampicillin (PRINCIPEN) 500 MG capsule Take 500 mg by mouth daily as needed (flare up).     . Ascorbic Acid (VITAMIN C) 1000 MG tablet Take 1,000 mg by mouth daily.    [name] Kitchen aspirin EC 81 MG tablet Take 81 mg by mouth daily. Swallow whole.    [name] Kitchen atenolol (TENORMIN) 50 MG tablet Take 50 mg by mouth daily.    . cholecalciferol (VITAMIN D) 1000 UNITS tablet Take 2,000 Units by mouth daily.     . ciprofloxacin (CIPRO) 500 MG tablet Take 1 tablet (500 mg total) by mouth 2 (two) times daily. 4 tablet 0  . donepezil (ARICEPT) 10 MG tablet TAKE 1 TABLET BY MOUTH AT BEDTIME (Patient taking differently: Take 10 mg by mouth at bedtime. ) 90 tablet 3  . fluticasone (FLONASE) 50 MCG/ACT nasal spray PLACE 2 SPRAYS IN [facility]  NOSTRIL DAILY (Patient taking differently: Place 2 sprays into both nostrils daily as needed for allergies or rhinitis. ) 48 mL 3  . Hyprom-Naphaz-Polysorb-Zn Sulf (CLEAR EYES COMPLETE OP) Place 1 drop into both eyes daily as needed (dry eyes).    . Insulin Pen Needle (PEN NEEDLES) 31G X 5 MM MISC 1 each by Does not apply route 3 (three) times daily. To inject insulin E11.42 90 each 2  . insulin regular human CONCENTRATED (HUMULIN R U-500 KWIKPEN) 500 UNIT/ML kwikpen Inject under skin 80-90 units before b'fast, 60 units before lunch, 80-90 units vefore  dinner (Patient taking differently: Inject 20-40 Units into the skin See admin instructions. Takes 35-40 units before breakfast, 20 units at lunch and 35-40 units at night) 6 pen 5  . lisinopril (ZESTRIL) 10 MG tablet Take 10 mg by mouth daily.    . memantine (NAMENDA) 10 MG tablet TAKE 1 TABLET BY MOUTH TWICE A DAY (Patient taking differently: Take 10 mg by mouth 2 (two) times daily. ) 180 tablet 3  . montelukast (SINGULAIR) 10 MG tablet Take 1 tablet (10 mg total) by mouth at bedtime. 30 tablet 3  . pantoprazole (PROTONIX) 20 MG tablet Take 1 tablet (20 mg total) by mouth daily. 30 tablet 2  . potassium chloride SA (KLOR-CON) 20 MEQ tablet Take 1 tablet (20 mEq total) by mouth 2 (two) times daily. 14 tablet 0  . prochlorperazine (COMPAZINE) 10 MG tablet Take 1 tablet (10 mg total) by mouth every 6 (six) hours as needed. (Patient taking differently: Take 10 mg by mouth every 6 (six) hours as needed for nausea. ) 30 tablet 2  . simvastatin (ZOCOR) 40 MG tablet TAKE 1 TABLET BY MOUTH AT BEDTIME (Patient taking differently: Take 40 mg by mouth every evening. ) 90 tablet 2  . sucralfate (CARAFATE) 1 g tablet Take 1 tablet (1 g total) by mouth 4 (four) times daily. Dissolve each tablet in 15 cc water before use. 120 tablet 2  . venlafaxine XR (EFFEXOR-XR) 75 MG 24 hr capsule TAKE 1 CAPSULE BY MOUTH EVERY MORNING AND 2 CAPSULES AT BEDTIME 270 capsule 1   No current facility-administered medications for this visit.    SURGICAL HISTORY:  Past Surgical History:  Procedure Laterality Date  . BRONCHIAL NEEDLE ASPIRATION BIOPSY  10/17/2019   Procedure: BRONCHIAL NEEDLE ASPIRATION BIOPSIES;  Surgeon: Juanito Doom, MD;  Location: [facility];  Service: Cardiopulmonary;;  . CARDIAC CATHETERIZATION  11/2011   [facility]  . CHOLECYSTECTOMY  1980  . ENDOBRONCHIAL ULTRASOUND Bilateral 10/17/2019   Procedure: ENDOBRONCHIAL ULTRASOUND;  Surgeon: Juanito Doom, MD;  Location: [facility];  Service:  Cardiopulmonary;  Laterality: Bilateral;  . ESOPHAGOGASTRODUODENOSCOPY (EGD) WITH PROPOFOL N/A 09/27/2017   Procedure: ESOPHAGOGASTRODUODENOSCOPY (EGD) WITH PROPOFOL;  Surgeon: Lin Landsman, MD;  Location: [facility];  Service: Gastroenterology;  Laterality: N/A;  . EYE SURGERY Right 07/29/2016   [facility] - Dr. Manuella Ghazi  . EYE SURGERY Left 08/23/2106   cataract - Dr. Manuella Ghazi  . HEMOSTASIS CONTROL  10/17/2019   Procedure: HEMOSTASIS CONTROL;  Surgeon: Juanito Doom, MD;  Location: [facility];  Service: Cardiopulmonary;;  . IR IMAGING GUIDED PORT INSERTION  11/30/2019  . IRRIGATION AND DEBRIDEMENT ABSCESS Left 11/23/2019   Procedure: IRRIGATION AND DEBRIDEMENT ABSCESS;  Surgeon: Melina Schools, MD;  Location: [facility];  Service: Orthopedics;  Laterality: Left;  . TOTAL HIP ARTHROPLASTY  2009   Dr. Gladstone Lighter  . TOTAL HIP ARTHROPLASTY      REVIEW OF SYSTEMS:  Constitutional: positive for fatigue Eyes: negative Ears, nose, mouth, throat, and face: negative Respiratory: positive for dyspnea on exertion Cardiovascular: negative Gastrointestinal: negative Genitourinary:negative Integument/breast: negative Hematologic/lymphatic: negative Musculoskeletal:negative Neurological: negative Behavioral/Psych: negative Endocrine: negative Allergic/Immunologic: negative   PHYSICAL EXAMINATION: General appearance: alert, cooperative, fatigued and no distress Head: Normocephalic, without obvious abnormality, atraumatic Neck: no adenopathy, no JVD, supple, symmetrical, trachea midline and thyroid not enlarged, symmetric, no tenderness/mass/nodules Lymph nodes: Cervical, supraclavicular, and axillary nodes normal. Resp: clear to auscultation bilaterally Back: symmetric, no curvature. ROM normal. No CVA tenderness. Cardio: regular rate and rhythm, S1, S2 normal, no murmur, click, rub or gallop GI: soft, non-tender; bowel sounds normal; no masses,  no organomegaly Extremities: extremities normal,  atraumatic, no cyanosis or edema Neurologic: Alert and oriented X 3, normal strength and tone. Normal symmetric reflexes. Normal coordination and gait  ECOG PERFORMANCE STATUS: 1 - Symptomatic but completely ambulatory  Blood pressure (!) 153/76, pulse 93, temperature 97.7 F (36.5 C), temperature source Tympanic, resp. rate 18, height 5\' 11"  (1.803 m), weight 187 lb 14.4 oz (85.2 kg), SpO2 100 %.  LABORATORY DATA: Lab Results  Component Value Date   WBC 11.0 (H) 02/11/2020   HGB 8.7 (L) 02/11/2020   HCT 25.4 (L) 02/11/2020   MCV 86.7 02/11/2020   PLT 152 02/11/2020      Chemistry      Component Value Date/Time   NA 136 02/04/2020 1221   NA 130 (L) 11/25/2011 1119   NA 137 11/16/2011 0513   K 3.0 (LL) 02/04/2020 1221   K 4.1 11/16/2011 0513   CL 98 02/04/2020 1221   CL 100 11/16/2011 0513   CO2 32 02/04/2020 1221   CO2 29 11/16/2011 0513   BUN 10 02/04/2020 1221   BUN 25 11/25/2011 1119   BUN 15 11/16/2011 0513   CREATININE 1.07 02/04/2020 1221   CREATININE 1.26 11/16/2011 0513      Component Value Date/Time   CALCIUM 8.7 (L) 02/04/2020 1221   CALCIUM 9.1 11/16/2011 0513   ALKPHOS 117 02/04/2020 1221   AST 8 (L) 02/04/2020 1221   ALT 10 02/04/2020 1221   BILITOT 0.5 02/04/2020 1221       RADIOGRAPHIC STUDIES: CT Chest Wo Contrast  Result Date: 02/07/2020 CLINICAL DATA:  Primary Cancer Type: Lung Imaging Indication: Assess response to therapy Interval therapy since last imaging? Yes Initial Cancer Diagnosis Date: 10/17/2019; Established by: Biopsy-proven Detailed Pathology:  Limited stage small cell lung cancer. Primary Tumor location: Right lower lobe/right hilar mass with obstruction of the right lower lobe bronchus. Surgeries: Cardiac catheterization.  Cholecystectomy. Chemotherapy: Yes; Ongoing? Yes; Most recent administration: 01/21/2020 Immunotherapy? No Radiation therapy? Yes; Date Range: 10/18/2019 - 11/28/2019; Target: Right lung EXAM: CT CHEST WITHOUT  CONTRAST TECHNIQUE: Multidetector CT imaging of the chest was performed following the standard protocol without IV contrast. COMPARISON:  Most recent CT chest 12/27/2019.  11/01/2019 PET-CT. FINDINGS: Cardiovascular: Aortic and branch vessel atherosclerosis. Right Port-A-Cath tip high right atrium. Normal heart size, without pericardial effusion. Multivessel coronary artery atherosclerosis. Mediastinum/Nodes: Right-sided thyroid nodule again identified at approximately 1.0 cm. Not clinically significant; no follow-up imaging recommended (ref: J Am Coll Radiol. 2015 Feb;12(2): 143-50).No supraclavicular adenopathy. Right paratracheal node measures 1.9 x 2.9 cm on 53/2 versus 2.1 x 2.9 cm on the prior exam (when remeasured). A node within the subcarinal station with extension into the azygoesophageal recess measures 1.4 cm on 75/2 versus 1.5 cm on the prior exam (when remeasured). Lungs/Pleura: No pleural fluid. Obstruction of the  superior segment right lower lobe bronchus is similar, including on 68/5. Moderate centrilobular emphysema A posteromedial right upper lobe 3 mm nodule on 46/5 is similar. The central superior segment right lower lobe (i.e. Perihilar) lung mass measures 4.7 x 3.4 cm on 64/2. Minimally decreased from 4.9 x 3.7 cm on the prior exam when measured similarly. Interval development of right greater than left, peribronchovascular predominant ground-glass and airspace opacities many of which are nodular. Example in the right upper lobe on 63/5, right lower lobe at up to 9 mm on 80/5 and more medial right lower lobe on 88/5. 1.1 cm sub solid right lower lobe pulmonary nodule is unchanged on 106/5. Upper Abdomen: Cholecystectomy. Normal imaged portions of the liver, spleen, stomach, pancreas, adrenal glands, kidneys. Descending duodenal diverticulum. Musculoskeletal: Nonacute anterior left rib fractures. Upper thoracic spondylosis. IMPRESSION: 1. Mild response to therapy of central superior segment  right lower lobe (perihilar) lung mass. 2. Development of right greater than left peribronchovascular ground-glass and nodular airspace disease. Not a typical appearance for radiation change. Favored to represent infection. Correlate with symptoms. 3. Similar to minimal improvement in thoracic adenopathy. 4. Aortic atherosclerosis (ICD10-I70.0), coronary artery atherosclerosis and emphysema (ICD10-J43.9). 5. Similar sub solid right lower lobe pulmonary nodule. Electronically Signed   By: Abigail Miyamoto M.D.   On: 02/07/2020 09:51    ASSESSMENT AND PLAN: This is a very pleasant 74 years old white male recently diagnosed with limited stage small cell lung cancer presented with large right lower lobe/right hilar mass with obstruction of the right lower lobe bronchus and bulky right lower lobe paratracheal and subcarinal adenopathy diagnosed in July 2021. The patient is currently undergoing systemic chemotherapy with carboplatin and etoposide status post 4 cycles. The patient tolerated the last cycle of his treatment well except for the fatigue secondary to chemotherapy-induced anemia. He had repeat CT scan of the chest performed recently.  I personally and independently reviewed the scans and discussed the results with the patient today. His scan showed further response to the treatment in the superior segment right lower lobe but no complete resolution of his disease. I discussed with the patient proceeding with 2 more cycles of systemic chemotherapy with the hope for further improvement of his disease. I will proceed with cycle #5 today. For the chemotherapy-induced anemia, he received 2 units of PRBCs transfusion last week.  We will continue to monitor him closely and consider the patient for transfusion if needed. He will come back for follow-up visit in 3 weeks for evaluation before starting cycle #6. For the hyperglycemia, we will give the patient 8 units of regular insulin today.  He was also advised  to reach out to his primary care physician for adjustment of his medication. The patient was advised to call immediately if he has any concerning symptoms in the interval. The patient voices understanding of current disease status and treatment options and is in agreement with the current care plan.  All questions were answered. The patient knows to call the clinic with any problems, questions or concerns. We can certainly see the patient much sooner if necessary.  Disclaimer: This note was dictated with voice recognition software. Similar sounding words can inadvertently be transcribed and may not be corrected upon review.

## 2020-02-11 NOTE — Patient Instructions (Signed)

## 2020-02-11 NOTE — Patient Instructions (Signed)
[facility] Discharge Instructions for Patients Receiving Chemotherapy  Today you received the following chemotherapy agents: Carboplatin and Etoposide  To help prevent nausea and vomiting after your treatment, we encourage you to take your nausea medication  as prescribed.    If you develop nausea and vomiting that is not controlled by your nausea medication, call the clinic.   BELOW ARE SYMPTOMS THAT SHOULD BE REPORTED IMMEDIATELY:  *FEVER GREATER THAN 100.5 F  *CHILLS WITH OR WITHOUT FEVER  NAUSEA AND VOMITING THAT IS NOT CONTROLLED WITH YOUR NAUSEA MEDICATION  *UNUSUAL SHORTNESS OF BREATH  *UNUSUAL BRUISING OR BLEEDING  TENDERNESS IN MOUTH AND THROAT WITH OR WITHOUT PRESENCE OF ULCERS  *URINARY PROBLEMS  *BOWEL PROBLEMS  UNUSUAL RASH Items with * indicate a potential emergency and should be followed up as soon as possible.  Feel free to call the clinic should you have any questions or concerns. The clinic phone number is [phone].  Please show the [facility] at check-in to the Emergency Department and triage nurse.

## 2020-02-12 ENCOUNTER — Encounter: Payer: Self-pay | Admitting: Family Medicine

## 2020-02-12 ENCOUNTER — Encounter: Payer: Self-pay | Admitting: *Deleted

## 2020-02-12 ENCOUNTER — Other Ambulatory Visit: Payer: Self-pay

## 2020-02-12 ENCOUNTER — Inpatient Hospital Stay: Payer: Medicare Other

## 2020-02-12 VITALS — BP 169/81 | HR 95 | Temp 98.4°F | Resp 18

## 2020-02-12 DIAGNOSIS — D6481 Anemia due to antineoplastic chemotherapy: Secondary | ICD-10-CM | POA: Diagnosis not present

## 2020-02-12 DIAGNOSIS — Z5111 Encounter for antineoplastic chemotherapy: Secondary | ICD-10-CM | POA: Diagnosis not present

## 2020-02-12 DIAGNOSIS — E43 Unspecified severe protein-calorie malnutrition: Secondary | ICD-10-CM | POA: Insufficient documentation

## 2020-02-12 DIAGNOSIS — E119 Type 2 diabetes mellitus without complications: Secondary | ICD-10-CM | POA: Diagnosis not present

## 2020-02-12 DIAGNOSIS — C3491 Malignant neoplasm of unspecified part of right bronchus or lung: Secondary | ICD-10-CM

## 2020-02-12 DIAGNOSIS — C3431 Malignant neoplasm of lower lobe, right bronchus or lung: Secondary | ICD-10-CM | POA: Diagnosis not present

## 2020-02-12 MED ORDER — SODIUM CHLORIDE 0.9 % IV SOLN
80.0000 mg/m2 | Freq: Once | INTRAVENOUS | Status: AC
  Administered 2020-02-12: 170 mg via INTRAVENOUS
  Filled 2020-02-12: qty 8.5

## 2020-02-12 MED ORDER — SODIUM CHLORIDE 0.9 % IV SOLN
Freq: Once | INTRAVENOUS | Status: AC
  Filled 2020-02-12: qty 250

## 2020-02-12 MED ORDER — SODIUM CHLORIDE 0.9% FLUSH
10.0000 mL | INTRAVENOUS | Status: DC | PRN
  Administered 2020-02-12: 10 mL
  Filled 2020-02-12: qty 10

## 2020-02-12 MED ORDER — HEPARIN SOD (PORK) LOCK FLUSH 100 UNIT/ML IV SOLN
500.0000 [IU] | Freq: Once | INTRAVENOUS | Status: DC | PRN
  Filled 2020-02-12: qty 5

## 2020-02-12 MED ORDER — SODIUM CHLORIDE 0.9 % IV SOLN
10.0000 mg | Freq: Once | INTRAVENOUS | Status: AC
  Administered 2020-02-12: 10 mg via INTRAVENOUS
  Filled 2020-02-12: qty 10

## 2020-02-12 NOTE — Progress Notes (Signed)
Dustin Vera T. Cerena Baine, MD, [place] at [facility] [name], [address]  Phone: [phone]  FAX: [phone]  [name] - 74 y.o. male  [name] [id number]  Date of Birth: [birth year]  Date: 02/13/2020  PCP: [name], MD  Referral: [name], MD  Chief Complaint  Patient presents with  . Annual Exam    Part 2    This visit occurred during the SARS-CoV-2 public health emergency.  Safety protocols were in place, including screening questions prior to the visit, additional usage of staff PPE, and extensive cleaning of exam room while observing appropriate contact time as indicated for disinfecting solutions.   Subjective:   [name] is a 74 y.o. very pleasant male patient who presents with the following:  Very complicated medical patient seen in follow-up after recent diagnosis of lung cancer, and this was initially seen with the chest CT in early July and he has been managed by Dr. Earlie Server and he has subsequently had PET scan, CT scans, and he is currently having chemotherapy and radiation.  He is a has had some complications including neutropenic fever as well as anemia.  He also had sepsis and was admitted on November 22, 2019 secondary to sepsis and fever to 78 Fahrenheit with a severe left lower extremity wound that had been treated as a diabetic chronic leg ulcer for multiple months by wound therapy. Dr. Rolena Infante saw him in the hospital and did an I&D followed by wound VAC placement and he has had ongoing management with wound care. he has been followed by wound.  Every other week now.   Dr. Renne Crigler has been following and trying to get his BS under control, which has been poorly controlled for years.   He also does have relatively early Alzheimer's and he is on Aricept and Namenda.  He also lives with his 47+ year old mother, and he has been her primary care  giver for some time.  Lipids: Doing well, stable. Tolerating meds fine with no SE. Panel reviewed with patient.  Lipids: Lab Results  Component Value Date   CHOL 172 01/03/2019   Lab Results  Component Value Date   HDL 31.40 (L) 01/03/2019   Lab Results  Component Value Date   LDLCALC 43 04/24/2009   Lab Results  Component Value Date   TRIG (H) 01/03/2019    521.0 Triglyceride is over 400; calculations on Lipids are invalid.   Lab Results  Component Value Date   CHOLHDL 5 01/03/2019    Lab Results  Component Value Date   ALT 8 02/11/2020   AST 9 (L) 02/11/2020   ALKPHOS 111 02/11/2020   BILITOT 0.4 02/11/2020    Immunization History  Administered Date(s) Administered  . Fluad Quad(high Dose 65+) 12/20/2018, 01/16/2020  . Influenza Split 12/29/2010  . Influenza Whole 01/27/2009, 02/05/2010  . Influenza, High Dose Seasonal PF 02/03/2017, 12/21/2018  . Influenza, Seasonal, Injecte, Preservative Fre 06/14/2012  . Influenza,inj,Quad PF,6+ Mos 12/18/2012, 12/19/2013, 03/19/2015, 01/05/2016, 01/16/2018  . Influenza,inj,quad, With Preservative 01/10/2017  . PFIZER SARS-COV-2 Vaccination 06/15/2019, 07/18/2019, 01/23/2020  . Pneumococcal Conjugate-13 12/19/2013  . Pneumococcal Polysaccharide-23 10/25/2008, 08/26/2015  . Td 04/13/2007    Health Maintenance  Topic Date Due  . TETANUS/TDAP  01/09/2024 (Originally 04/12/2017)  . FOOT EXAM  03/14/2020  . HEMOGLOBIN A1C  06/08/2020  . OPHTHALMOLOGY EXAM  08/12/2020  . COLONOSCOPY  09/15/2020  .  INFLUENZA VACCINE  Completed  . COVID-19 Vaccine  Completed  . Hepatitis C Screening  Completed  . PNA vac Low Risk Adult  Completed     Review of Systems is noted in the HPI, as appropriate  Objective:   BP (!) 160/84   Pulse 88   Temp 98 F (36.7 C) (Temporal)   Ht _0  (1.803 m)   Wt 189 lb 8 oz (86 kg)   SpO2 98%   BMI 26.43 kg/m   GEN: WDWN, NAD, Non-toxic HEENT: Atraumatic, Normocephalic. Neck supple. No  masses. CV: RRR, No M/G/R. No JVD. No thrill. No extra heart sounds. PULM: CTA B, no wheezes, crackles, rhonchi. No retractions. No resp. distress. No accessory muscle use. EXTR: He does have some modest swelling bilateral lower extremities. NEURO Normal gait.  PSYCH: Normally interactive. Conversant.   Laboratory and Imaging Data: Lab Review:  CBC EXTENDED Latest Ref Rng & Units 02/11/2020 02/04/2020 01/28/2020  WBC 4.0 - 10.5 K/uL 11.0(H) 6.2 11.8(H)  RBC 4.22 - 5.81 MIL/uL 2.93(L) 2.28(L) 2.93(L)  HGB 13.0 - 17.0 g/dL 8.7(L) 6.8(LL) 8.6(L)  HCT 39 - 52 % 25.4(L) 19.5(L) 25.3(L)  PLT 150 - 400 K/uL 152 27(L) 79(L)  NEUTROABS 1.7 - 7.7 K/uL 9.3(H) 4.8 11.1(H)  LYMPHSABS 0.7 - 4.0 K/uL 0.5(L) 0.5(L) 0.5(L)    BMP Latest Ref Rng & Units 02/11/2020 02/04/2020 01/28/2020  Glucose 70 - 99 mg/dL 346(H) 148(H) 339(H)  BUN 8 - 23 mg/dL _1 Creatinine 0.61 - 1.24 mg/dL 1.35(H) 1.07 1.15  BUN/Creat Ratio 10 - 22 - - -  Sodium 135 - 145 mmol/L 134(L) 136 134(L)  Potassium 3.5 - 5.1 mmol/L 4.0 3.0(LL) 3.8  Chloride 98 - 111 mmol/L 96(L) 98 98  CO2 22 - 32 mmol/L 30 32 28  Calcium 8.9 - 10.3 mg/dL 8.8(L) 8.7(L) 8.6(L)    Hepatic Function Latest Ref Rng & Units 02/11/2020 02/04/2020 01/28/2020  Total Protein 6.5 - 8.1 g/dL 6.4(L) 6.2(L) 6.0(L)  Albumin 3.5 - 5.0 g/dL 2.5(L) 2.7(L) 2.6(L)  AST 15 - 41 U/L 9(L) 8(L) 9(L)  ALT 0 - 44 U/L _2 Alk Phosphatase 38 - 126 U/L 111 117 128(H)  Total Bilirubin 0.3 - 1.2 mg/dL 0.4 0.5 0.6  Bilirubin, Direct 0.0 - 0.3 mg/dL - - -    Lab Results  Component Value Date   CHOL 172 01/03/2019   Lab Results  Component Value Date   HDL 31.40 (L) 01/03/2019   Lab Results  Component Value Date   LDLCALC 43 04/24/2009   Lab Results  Component Value Date   TRIG (H) 01/03/2019    521.0 Triglyceride is over 400; calculations on Lipids are invalid.   Lab Results  Component Value Date   CHOLHDL 5 01/03/2019   No results for input(s): PSA  in the last 72 hours. Lab Results  Component Value Date   HCVAB NEGATIVE 08/26/2015   Lab Results  Component Value Date   VD25OH 15.29 (L) 12/29/2017     Lab Results  Component Value Date   HGBA1C 10.2 (H) 11/23/2019   HGBA1C 10.6 (H) 10/10/2019   HGBA1C 13.0 (A) 09/17/2019   Lab Results  Component Value Date   MICROALBUR 57.6 (H) 01/03/2019   LDLCALC 43 04/24/2009   CREATININE 1.35 (H) 02/11/2020    Assessment and Plan:     ICD-10-CM   1. Severe protein-calorie malnutrition ([place])  E43   2. Uncontrolled type 2 diabetes mellitus with peripheral  neuropathy ([place])  E11.42    E11.65   3. Essential hypertension  I10   4. Stage 3 chronic kidney disease, unspecified whether stage 3a or 3b CKD ([facility])  N18.30   5. Small cell lung cancer ([facility])  C34.90   6. Diabetic mononeuropathy associated with diabetes mellitus due to underlying condition ([place])  E08.41   7. Major depressive disorder, recurrent episode, in partial remission ([place])  F33.41   8. 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 Goals of care, counseling/discussion  Z71.89    Total encounter time: 40 minutes. This includes total time spent on the day of encounter.  Extensive very complicated chart review from multiple subspecialties, imaging, admission records, operative records.  He is having multiple ongoing problems, primarily driven by his lung cancer, with recent sepsis, recent surgical debridement of his left foot.  He has been managed by wound care.  He has multiple ongoing issues related to his cancer including anemia, and very very low platelets.  Defer management to oncology.  I talked primarily with him about protein calorie malnutrition.  His albumin is 2.5.  I encouraged him to eat more.  While he does need to avoid carbs, I think at this point extra protein and fat would serve him well.   Recommended that he can eat things such as eggs, bacon, sausage, steak, pork, and he does not really need to limit himself.  Continue to avoid carbs in the setting of poorly controlled diabetes.  He also is going to drink 1 diabetic or Glucerna on top of his regular meals.  He has followed with multiple other doctors, and at this point does take precedence and I will see him in 6 months  Follow-up: Return in about 6 months (around 08/12/2020).  Signed,  Maud Deed. Bryar Rennie, MD   Outpatient Encounter Medications as of 02/13/2020  Medication Sig  . acetaminophen (QC ACETAMINOPHEN 8HR ARTH PAIN) 650 MG CR tablet Take 650 mg by mouth every 8 (eight) hours as needed for pain.  [name] Kitchen ampicillin (PRINCIPEN) 500 MG capsule Take 500 mg by mouth daily as needed (flare up).   . Ascorbic Acid (VITAMIN C) 1000 MG tablet Take 1,000 mg by mouth daily.  [name] Kitchen aspirin EC 81 MG tablet Take 81 mg by mouth daily. Swallow whole.  [name] Kitchen atenolol (TENORMIN) 50 MG tablet Take 50 mg by mouth daily.  . cholecalciferol (VITAMIN D) 1000 UNITS tablet Take 2,000 Units by mouth daily.   . ciprofloxacin (CIPRO) 500 MG tablet Take 1 tablet (500 mg total) by mouth 2 (two) times daily.  [name] Kitchen donepezil (ARICEPT) 10 MG tablet TAKE 1 TABLET BY MOUTH AT BEDTIME (Patient taking differently: Take 10 mg by mouth at bedtime. )  . fluticasone (FLONASE) 50 MCG/ACT nasal spray PLACE 2 SPRAYS IN EACH NOSTRIL DAILY (Patient taking differently: Place 2 sprays into both nostrils daily as needed for allergies or rhinitis. )  . Hyprom-Naphaz-Polysorb-Zn Sulf (CLEAR EYES COMPLETE OP) Place 1 drop into both eyes daily as needed (dry eyes).  . Insulin Pen Needle (PEN NEEDLES) 31G X 5 MM MISC 1 each by Does not apply route 3 (three) times daily. To inject insulin E11.42  . insulin regular human CONCENTRATED (HUMULIN R U-500 KWIKPEN) 500 UNIT/ML kwikpen Inject under skin 80-90 units before b'fast, 60 units before lunch, 80-90 units vefore dinner (Patient taking  differently: Inject 20-40 Units into  the skin See admin instructions. Takes 35-40 units before breakfast, 20 units at lunch and 35-40 units at night)  . lisinopril (ZESTRIL) 10 MG tablet Take 10 mg by mouth daily.  . memantine (NAMENDA) 10 MG tablet TAKE 1 TABLET BY MOUTH TWICE A DAY (Patient taking differently: Take 10 mg by mouth 2 (two) times daily. )  . montelukast (SINGULAIR) 10 MG tablet Take 1 tablet (10 mg total) by mouth at bedtime.  . pantoprazole (PROTONIX) 20 MG tablet Take 1 tablet (20 mg total) by mouth daily.  . potassium chloride SA (KLOR-CON) 20 MEQ tablet Take 1 tablet (20 mEq total) by mouth 2 (two) times daily.  . prochlorperazine (COMPAZINE) 10 MG tablet Take 1 tablet (10 mg total) by mouth every 6 (six) hours as needed. (Patient taking differently: Take 10 mg by mouth every 6 (six) hours as needed for nausea. )  . simvastatin (ZOCOR) 40 MG tablet TAKE 1 TABLET BY MOUTH AT BEDTIME (Patient taking differently: Take 40 mg by mouth every evening. )  . sucralfate (CARAFATE) 1 g tablet Take 1 tablet (1 g total) by mouth 4 (four) times daily. Dissolve each tablet in 15 cc water before use.  . venlafaxine XR (EFFEXOR-XR) 75 MG 24 hr capsule TAKE 1 CAPSULE BY MOUTH EVERY MORNING AND 2 CAPSULES AT BEDTIME  . [DISCONTINUED] heparin lock flush 100 unit/mL   . [DISCONTINUED] sodium chloride flush (NS) 0.9 % injection 10 mL    No facility-administered encounter medications on file as of 02/13/2020.

## 2020-02-12 NOTE — Patient Instructions (Signed)
[facility] Discharge Instructions for Patients Receiving Chemotherapy  Today you received the following chemotherapy agents: etoposide  To help prevent nausea and vomiting after your treatment, we encourage you to take your nausea medication as directed.   If you develop nausea and vomiting that is not controlled by your nausea medication, call the clinic.   BELOW ARE SYMPTOMS THAT SHOULD BE REPORTED IMMEDIATELY:  *FEVER GREATER THAN 100.5 F  *CHILLS WITH OR WITHOUT FEVER  NAUSEA AND VOMITING THAT IS NOT CONTROLLED WITH YOUR NAUSEA MEDICATION  *UNUSUAL SHORTNESS OF BREATH  *UNUSUAL BRUISING OR BLEEDING  TENDERNESS IN MOUTH AND THROAT WITH OR WITHOUT PRESENCE OF ULCERS  *URINARY PROBLEMS  *BOWEL PROBLEMS  UNUSUAL RASH Items with * indicate a potential emergency and should be followed up as soon as possible.  Feel free to call the clinic should you have any questions or concerns. The clinic phone number is [phone].  Please show the CHEMO ALERT CARD at check-in to the Emergency Department and triage nurse.   

## 2020-02-12 NOTE — Progress Notes (Signed)
Per Dr. [name], [facility] to start Etopside today, even though it has not been the full 24 hours between treatment

## 2020-02-13 ENCOUNTER — Encounter: Payer: Self-pay | Admitting: Family Medicine

## 2020-02-13 ENCOUNTER — Other Ambulatory Visit: Payer: Self-pay | Admitting: *Deleted

## 2020-02-13 ENCOUNTER — Other Ambulatory Visit: Payer: Self-pay

## 2020-02-13 ENCOUNTER — Inpatient Hospital Stay: Payer: Medicare Other

## 2020-02-13 ENCOUNTER — Ambulatory Visit (INDEPENDENT_AMBULATORY_CARE_PROVIDER_SITE_OTHER): Payer: Medicare Other | Admitting: Family Medicine

## 2020-02-13 VITALS — BP 160/84 | HR 88 | Temp 98.0°F | Ht 71.0 in | Wt 189.5 lb

## 2020-02-13 VITALS — BP 138/69 | HR 92 | Temp 97.7°F | Resp 18

## 2020-02-13 DIAGNOSIS — C3490 Malignant neoplasm of unspecified part of unspecified bronchus or lung: Secondary | ICD-10-CM | POA: Diagnosis not present

## 2020-02-13 DIAGNOSIS — E0841 Diabetes mellitus due to underlying condition with diabetic mononeuropathy: Secondary | ICD-10-CM

## 2020-02-13 DIAGNOSIS — Z7189 Other specified counseling: Secondary | ICD-10-CM

## 2020-02-13 DIAGNOSIS — Z5111 Encounter for antineoplastic chemotherapy: Secondary | ICD-10-CM | POA: Diagnosis not present

## 2020-02-13 DIAGNOSIS — G4733 Obstructive sleep apnea (adult) (pediatric): Secondary | ICD-10-CM | POA: Diagnosis not present

## 2020-02-13 DIAGNOSIS — IMO0002 Reserved for concepts with insufficient information to code with codable children: Secondary | ICD-10-CM

## 2020-02-13 DIAGNOSIS — E1142 Type 2 diabetes mellitus with diabetic polyneuropathy: Secondary | ICD-10-CM | POA: Diagnosis not present

## 2020-02-13 DIAGNOSIS — S2232XD Fracture of one rib, left side, subsequent encounter for fracture with routine healing: Secondary | ICD-10-CM | POA: Diagnosis not present

## 2020-02-13 DIAGNOSIS — I129 Hypertensive chronic kidney disease with stage 1 through stage 4 chronic kidney disease, or unspecified chronic kidney disease: Secondary | ICD-10-CM | POA: Diagnosis not present

## 2020-02-13 DIAGNOSIS — I10 Essential (primary) hypertension: Secondary | ICD-10-CM | POA: Diagnosis not present

## 2020-02-13 DIAGNOSIS — Z9181 History of falling: Secondary | ICD-10-CM | POA: Diagnosis not present

## 2020-02-13 DIAGNOSIS — Z87891 Personal history of nicotine dependence: Secondary | ICD-10-CM | POA: Diagnosis not present

## 2020-02-13 DIAGNOSIS — E43 Unspecified severe protein-calorie malnutrition: Secondary | ICD-10-CM | POA: Diagnosis not present

## 2020-02-13 DIAGNOSIS — D6481 Anemia due to antineoplastic chemotherapy: Secondary | ICD-10-CM | POA: Diagnosis not present

## 2020-02-13 DIAGNOSIS — R000 Tachycardia, unspecified: Secondary | ICD-10-CM | POA: Diagnosis not present

## 2020-02-13 DIAGNOSIS — E11621 Type 2 diabetes mellitus with foot ulcer: Secondary | ICD-10-CM | POA: Diagnosis not present

## 2020-02-13 DIAGNOSIS — J45909 Unspecified asthma, uncomplicated: Secondary | ICD-10-CM | POA: Diagnosis not present

## 2020-02-13 DIAGNOSIS — E08621 Diabetes mellitus due to underlying condition with foot ulcer: Secondary | ICD-10-CM

## 2020-02-13 DIAGNOSIS — L97429 Non-pressure chronic ulcer of left heel and midfoot with unspecified severity: Secondary | ICD-10-CM | POA: Diagnosis not present

## 2020-02-13 DIAGNOSIS — E1165 Type 2 diabetes mellitus with hyperglycemia: Secondary | ICD-10-CM

## 2020-02-13 DIAGNOSIS — C3491 Malignant neoplasm of unspecified part of right bronchus or lung: Secondary | ICD-10-CM

## 2020-02-13 DIAGNOSIS — E119 Type 2 diabetes mellitus without complications: Secondary | ICD-10-CM | POA: Diagnosis not present

## 2020-02-13 DIAGNOSIS — N183 Chronic kidney disease, stage 3 unspecified: Secondary | ICD-10-CM | POA: Diagnosis not present

## 2020-02-13 DIAGNOSIS — E041 Nontoxic single thyroid nodule: Secondary | ICD-10-CM | POA: Diagnosis not present

## 2020-02-13 DIAGNOSIS — E1122 Type 2 diabetes mellitus with diabetic chronic kidney disease: Secondary | ICD-10-CM | POA: Diagnosis not present

## 2020-02-13 DIAGNOSIS — Z794 Long term (current) use of insulin: Secondary | ICD-10-CM | POA: Diagnosis not present

## 2020-02-13 DIAGNOSIS — C3431 Malignant neoplasm of lower lobe, right bronchus or lung: Secondary | ICD-10-CM | POA: Diagnosis not present

## 2020-02-13 DIAGNOSIS — N1831 Chronic kidney disease, stage 3a: Secondary | ICD-10-CM | POA: Diagnosis not present

## 2020-02-13 DIAGNOSIS — F3341 Major depressive disorder, recurrent, in partial remission: Secondary | ICD-10-CM

## 2020-02-13 DIAGNOSIS — L97402 Non-pressure chronic ulcer of unspecified heel and midfoot with fat layer exposed: Secondary | ICD-10-CM

## 2020-02-13 MED ORDER — SODIUM CHLORIDE 0.9% FLUSH
10.0000 mL | INTRAVENOUS | Status: DC | PRN
  Administered 2020-02-13: 10 mL
  Filled 2020-02-13: qty 10

## 2020-02-13 MED ORDER — SODIUM CHLORIDE 0.9 % IV SOLN
Freq: Once | INTRAVENOUS | Status: AC
  Filled 2020-02-13: qty 250

## 2020-02-13 MED ORDER — HEPARIN SOD (PORK) LOCK FLUSH 100 UNIT/ML IV SOLN
500.0000 [IU] | Freq: Once | INTRAVENOUS | Status: AC | PRN
  Administered 2020-02-13: 500 [IU]
  Filled 2020-02-13: qty 5

## 2020-02-13 MED ORDER — SODIUM CHLORIDE 0.9 % IV SOLN
10.0000 mg | Freq: Once | INTRAVENOUS | Status: AC
  Administered 2020-02-13: 10 mg via INTRAVENOUS
  Filled 2020-02-13: qty 10

## 2020-02-13 MED ORDER — SODIUM CHLORIDE 0.9 % IV SOLN
80.0000 mg/m2 | Freq: Once | INTRAVENOUS | Status: AC
  Administered 2020-02-13: 170 mg via INTRAVENOUS
  Filled 2020-02-13: qty 8.5

## 2020-02-13 NOTE — Patient Outreach (Signed)
[facility]) Care Management  02/13/2020  [name] Jun 25, 1945 [id number]   Call placed to member to follow up on management of wound healing and cancer treatments.  State he is still going 3 times a week every 3 weeks for his treatments (this is a treatment week, MWF, then will repeat in 3 weeks).  He feels tired some days after treatment but denies any other complications.  Report foot is still healing, wound nurse visits 3 times a week for dressing changes, office visits to wound center every other week.  Discussed management of blood sugar in relation to wound healing, state his reading today was much better than it has been, report 166 today.  Call was cut short due to visit arriving at the home.  Agrees to follow up within the next month, encouraged to contact this care manager with questions.  Goals Addressed            This Visit's Progress   . [facility] - Careful Skin Care   On track    Follow Up Date 12/3 (date updated)   - clean and dry skin well - use lotion that is suggested by the doctor or nurse    Why is this important?   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   Taking really good care of your skin will help to keep your skin unbroken.    Notes:   11/3 - Wound still in the process of healing    . [facility] - Make and Keep All Appointments   On track    Follow Up Date 12/3 (date updated)   - call to cancel if needed - keep a calendar with appointment dates    Why is this important?   Part of staying healthy is seeing the doctor for follow-up care.  If you forget your appointments, there are some things you can do to stay on track.    Notes:   Continues with cancer treatment appointments    . [facility] - Manage Fatigue (Tiredness)   On track    Follow Up Date 12/15   - develop a new routine to improve sleep - eat healthy - maintain healthy weight    Why is this important?   Cancer treatment and its side effects can drain your  energy. It can keep you from doing things you would like to do.  There are many things that you can do to manage fatigue.    Notes:   Re-educated on slowly increasing activity to decrease fatigue      [name], Therapist, sports, MSN [facility] [phone]

## 2020-02-13 NOTE — Patient Instructions (Signed)
You need to eat to more calories.  I want you to eat more: Eggs, Berniece Salines, Sausage, Ham, BBQ, fish, chicken  Eating fried food it ok  I don't really want you to eat a lot of carbs that will increase your blood sugar Like past, pizza, fries, etc.   Get a diabetic protein drink - like Diabeta or Glucerna I want you drink at least one, once a day for a snack

## 2020-02-15 ENCOUNTER — Inpatient Hospital Stay: Payer: Medicare Other

## 2020-02-15 ENCOUNTER — Ambulatory Visit (HOSPITAL_BASED_OUTPATIENT_CLINIC_OR_DEPARTMENT_OTHER): Payer: Medicare Other | Admitting: Medical

## 2020-02-15 ENCOUNTER — Other Ambulatory Visit: Payer: Self-pay | Admitting: Medical

## 2020-02-15 ENCOUNTER — Other Ambulatory Visit: Payer: Self-pay

## 2020-02-15 VITALS — BP 156/79 | HR 113 | Temp 98.1°F | Resp 18

## 2020-02-15 DIAGNOSIS — S2232XD Fracture of one rib, left side, subsequent encounter for fracture with routine healing: Secondary | ICD-10-CM | POA: Diagnosis not present

## 2020-02-15 DIAGNOSIS — D6481 Anemia due to antineoplastic chemotherapy: Secondary | ICD-10-CM | POA: Diagnosis not present

## 2020-02-15 DIAGNOSIS — L97429 Non-pressure chronic ulcer of left heel and midfoot with unspecified severity: Secondary | ICD-10-CM | POA: Diagnosis not present

## 2020-02-15 DIAGNOSIS — R000 Tachycardia, unspecified: Secondary | ICD-10-CM | POA: Diagnosis not present

## 2020-02-15 DIAGNOSIS — E1122 Type 2 diabetes mellitus with diabetic chronic kidney disease: Secondary | ICD-10-CM | POA: Diagnosis not present

## 2020-02-15 DIAGNOSIS — G4733 Obstructive sleep apnea (adult) (pediatric): Secondary | ICD-10-CM | POA: Diagnosis not present

## 2020-02-15 DIAGNOSIS — E11621 Type 2 diabetes mellitus with foot ulcer: Secondary | ICD-10-CM | POA: Diagnosis not present

## 2020-02-15 DIAGNOSIS — C3491 Malignant neoplasm of unspecified part of right bronchus or lung: Secondary | ICD-10-CM

## 2020-02-15 DIAGNOSIS — Z87891 Personal history of nicotine dependence: Secondary | ICD-10-CM | POA: Diagnosis not present

## 2020-02-15 DIAGNOSIS — C3431 Malignant neoplasm of lower lobe, right bronchus or lung: Secondary | ICD-10-CM | POA: Diagnosis not present

## 2020-02-15 DIAGNOSIS — E1165 Type 2 diabetes mellitus with hyperglycemia: Secondary | ICD-10-CM | POA: Diagnosis not present

## 2020-02-15 DIAGNOSIS — Z5111 Encounter for antineoplastic chemotherapy: Secondary | ICD-10-CM | POA: Diagnosis not present

## 2020-02-15 DIAGNOSIS — E1142 Type 2 diabetes mellitus with diabetic polyneuropathy: Secondary | ICD-10-CM | POA: Diagnosis not present

## 2020-02-15 DIAGNOSIS — I129 Hypertensive chronic kidney disease with stage 1 through stage 4 chronic kidney disease, or unspecified chronic kidney disease: Secondary | ICD-10-CM | POA: Diagnosis not present

## 2020-02-15 DIAGNOSIS — J069 Acute upper respiratory infection, unspecified: Secondary | ICD-10-CM

## 2020-02-15 DIAGNOSIS — Z9181 History of falling: Secondary | ICD-10-CM | POA: Diagnosis not present

## 2020-02-15 DIAGNOSIS — C3490 Malignant neoplasm of unspecified part of unspecified bronchus or lung: Secondary | ICD-10-CM | POA: Diagnosis not present

## 2020-02-15 DIAGNOSIS — E119 Type 2 diabetes mellitus without complications: Secondary | ICD-10-CM | POA: Diagnosis not present

## 2020-02-15 DIAGNOSIS — N1831 Chronic kidney disease, stage 3a: Secondary | ICD-10-CM | POA: Diagnosis not present

## 2020-02-15 DIAGNOSIS — Z794 Long term (current) use of insulin: Secondary | ICD-10-CM | POA: Diagnosis not present

## 2020-02-15 DIAGNOSIS — J45909 Unspecified asthma, uncomplicated: Secondary | ICD-10-CM | POA: Diagnosis not present

## 2020-02-15 DIAGNOSIS — E041 Nontoxic single thyroid nodule: Secondary | ICD-10-CM | POA: Diagnosis not present

## 2020-02-15 MED ORDER — PEGFILGRASTIM-CBQV 6 MG/0.6ML ~~LOC~~ SOSY
6.0000 mg | PREFILLED_SYRINGE | Freq: Once | SUBCUTANEOUS | Status: AC
  Administered 2020-02-15: 6 mg via SUBCUTANEOUS

## 2020-02-15 MED ORDER — PEGFILGRASTIM-CBQV 6 MG/0.6ML ~~LOC~~ SOSY
PREFILLED_SYRINGE | SUBCUTANEOUS | Status: AC
  Filled 2020-02-15: qty 0.6

## 2020-02-15 MED ORDER — AMOXICILLIN-POT CLAVULANATE 875-125 MG PO TABS: 1.0000 | ORAL_TABLET | Freq: Two times a day (BID) | ORAL | 0 refills | Status: DC

## 2020-02-15 MED ORDER — ALBUTEROL SULFATE HFA 108 (90 BASE) MCG/ACT IN AERS: 1.0000 | INHALATION_SPRAY | Freq: Four times a day (QID) | RESPIRATORY_TRACT | 2 refills | Status: AC | PRN

## 2020-02-18 ENCOUNTER — Other Ambulatory Visit: Payer: Self-pay

## 2020-02-18 ENCOUNTER — Inpatient Hospital Stay: Payer: Medicare Other

## 2020-02-18 DIAGNOSIS — C3491 Malignant neoplasm of unspecified part of right bronchus or lung: Secondary | ICD-10-CM

## 2020-02-18 DIAGNOSIS — E119 Type 2 diabetes mellitus without complications: Secondary | ICD-10-CM | POA: Diagnosis not present

## 2020-02-18 DIAGNOSIS — C3431 Malignant neoplasm of lower lobe, right bronchus or lung: Secondary | ICD-10-CM | POA: Diagnosis not present

## 2020-02-18 DIAGNOSIS — Z5111 Encounter for antineoplastic chemotherapy: Secondary | ICD-10-CM | POA: Diagnosis not present

## 2020-02-18 DIAGNOSIS — D6481 Anemia due to antineoplastic chemotherapy: Secondary | ICD-10-CM | POA: Diagnosis not present

## 2020-02-18 LAB — SAMPLE TO BLOOD BANK

## 2020-02-18 LAB — CBC WITH DIFFERENTIAL (CANCER CENTER ONLY)
Abs Immature Granulocytes: 0 10*3/uL (ref 0.00–0.07)
Band Neutrophils: 1 %
Basophils Absolute: 0.1 10*3/uL (ref 0.0–0.1)
Basophils Relative: 1 %
Eosinophils Absolute: 0 10*3/uL (ref 0.0–0.5)
Eosinophils Relative: 0 %
HCT: 22.1 % — ABNORMAL LOW (ref 39.0–52.0)
Hemoglobin: 7.8 g/dL — ABNORMAL LOW (ref 13.0–17.0)
Lymphocytes Relative: 2 %
Lymphs Abs: 0.2 10*3/uL — ABNORMAL LOW (ref 0.7–4.0)
MCH: 30.1 pg (ref 26.0–34.0)
MCHC: 35.3 g/dL (ref 30.0–36.0)
MCV: 85.3 fL (ref 80.0–100.0)
Monocytes Absolute: 0.2 10*3/uL (ref 0.1–1.0)
Monocytes Relative: 2 %
Neutro Abs: 11.1 10*3/uL — ABNORMAL HIGH (ref 1.7–7.7)
Neutrophils Relative %: 94 %
Platelet Count: 90 10*3/uL — ABNORMAL LOW (ref 150–400)
RBC: 2.59 MIL/uL — ABNORMAL LOW (ref 4.22–5.81)
RDW: 14.1 % (ref 11.5–15.5)
WBC Count: 11.7 10*3/uL — ABNORMAL HIGH (ref 4.0–10.5)
nRBC: 0 % (ref 0.0–0.2)

## 2020-02-18 LAB — CMP (CANCER CENTER ONLY)
ALT: 8 U/L (ref 0–44)
AST: 9 U/L — ABNORMAL LOW (ref 15–41)
Albumin: 2.6 g/dL — ABNORMAL LOW (ref 3.5–5.0)
Alkaline Phosphatase: 133 U/L — ABNORMAL HIGH (ref 38–126)
Anion gap: 6 (ref 5–15)
BUN: 20 mg/dL (ref 8–23)
CO2: 29 mmol/L (ref 22–32)
Calcium: 8.3 mg/dL — ABNORMAL LOW (ref 8.9–10.3)
Chloride: 97 mmol/L — ABNORMAL LOW (ref 98–111)
Creatinine: 1.22 mg/dL (ref 0.61–1.24)
GFR, Estimated: >60 mL/min (ref >60)
Glucose, Bld: 359 mg/dL — ABNORMAL HIGH (ref 70–99)
Potassium: 4.2 mmol/L (ref 3.5–5.1)
Sodium: 132 mmol/L — ABNORMAL LOW (ref 135–145)
Total Bilirubin: 0.6 mg/dL (ref 0.3–1.2)
Total Protein: 6.1 g/dL — ABNORMAL LOW (ref 6.5–8.1)

## 2020-02-18 MED ORDER — HEPARIN SOD (PORK) LOCK FLUSH 100 UNIT/ML IV SOLN
500.0000 [IU] | Freq: Once | INTRAVENOUS | Status: AC | PRN
  Administered 2020-02-18: 500 [IU]
  Filled 2020-02-18: qty 5

## 2020-02-18 MED ORDER — SODIUM CHLORIDE 0.9% FLUSH
10.0000 mL | INTRAVENOUS | Status: DC | PRN
  Administered 2020-02-18: 10 mL
  Filled 2020-02-18: qty 10

## 2020-02-18 NOTE — Patient Instructions (Signed)

## 2020-02-18 NOTE — Progress Notes (Signed)
cbc

## 2020-02-19 DIAGNOSIS — E1142 Type 2 diabetes mellitus with diabetic polyneuropathy: Secondary | ICD-10-CM | POA: Diagnosis not present

## 2020-02-19 DIAGNOSIS — E1165 Type 2 diabetes mellitus with hyperglycemia: Secondary | ICD-10-CM | POA: Diagnosis not present

## 2020-02-19 DIAGNOSIS — Z794 Long term (current) use of insulin: Secondary | ICD-10-CM | POA: Diagnosis not present

## 2020-02-19 DIAGNOSIS — Z87891 Personal history of nicotine dependence: Secondary | ICD-10-CM | POA: Diagnosis not present

## 2020-02-19 DIAGNOSIS — G4733 Obstructive sleep apnea (adult) (pediatric): Secondary | ICD-10-CM | POA: Diagnosis not present

## 2020-02-19 DIAGNOSIS — J45909 Unspecified asthma, uncomplicated: Secondary | ICD-10-CM | POA: Diagnosis not present

## 2020-02-19 DIAGNOSIS — E1122 Type 2 diabetes mellitus with diabetic chronic kidney disease: Secondary | ICD-10-CM | POA: Diagnosis not present

## 2020-02-19 DIAGNOSIS — R000 Tachycardia, unspecified: Secondary | ICD-10-CM | POA: Diagnosis not present

## 2020-02-19 DIAGNOSIS — E11621 Type 2 diabetes mellitus with foot ulcer: Secondary | ICD-10-CM | POA: Diagnosis not present

## 2020-02-19 DIAGNOSIS — L97429 Non-pressure chronic ulcer of left heel and midfoot with unspecified severity: Secondary | ICD-10-CM | POA: Diagnosis not present

## 2020-02-19 DIAGNOSIS — C3491 Malignant neoplasm of unspecified part of right bronchus or lung: Secondary | ICD-10-CM | POA: Diagnosis not present

## 2020-02-19 DIAGNOSIS — I129 Hypertensive chronic kidney disease with stage 1 through stage 4 chronic kidney disease, or unspecified chronic kidney disease: Secondary | ICD-10-CM | POA: Diagnosis not present

## 2020-02-19 DIAGNOSIS — S2232XD Fracture of one rib, left side, subsequent encounter for fracture with routine healing: Secondary | ICD-10-CM | POA: Diagnosis not present

## 2020-02-19 DIAGNOSIS — Z9181 History of falling: Secondary | ICD-10-CM | POA: Diagnosis not present

## 2020-02-19 DIAGNOSIS — N1831 Chronic kidney disease, stage 3a: Secondary | ICD-10-CM | POA: Diagnosis not present

## 2020-02-19 DIAGNOSIS — E041 Nontoxic single thyroid nodule: Secondary | ICD-10-CM | POA: Diagnosis not present

## 2020-02-19 NOTE — Progress Notes (Signed)
[facility] Progress Note   [name] [id number] 1945-07-03 74 y.o.  [name] is managed by Dr. Fanny Bien. [name]  Actively treated with chemotherapy/immunotherapy/hormonal therapy: yes  Current therapy: Carboplatin and etoposide with [name] support  Last treated: 02/11/2020 (cycle 5, day 1)  Next scheduled appointment with provider: 03/03/2020  Assessment: Plan:    Small cell lung cancer ([name])  Upper respiratory tract infection, unspecified type   Limited stage small cell lung cancer: [name] is followed by Dr. [name] and is status post cycle 5, day 1 of carboplatin and etoposide which was dosed on 02/11/2020.  He is scheduled to be seen in follow-up on 03/03/2020.  Upper respiratory tract infection: [name] was given a prescription for Augmentin 875-125 p.o. twice daily x7 days.  Additionally he was given a prescription for an albuterol inhaler.  Please see After Visit Summary for patient specific instructions.  Future Appointments  Date Time Provider [place]  02/20/2020 11:00 AM Ricard Dillon, MD [facility] None  02/25/2020  9:00 AM [facility] PHARMACIST [facility]  02/25/2020 11:30 AM [facility] None  02/25/2020 11:45 AM [facility] [place] FLUSH [facility] None  03/03/2020 11:30 AM [facility] None  03/03/2020 11:45 AM [facility] [place] FLUSH [facility] None  03/03/2020 12:15 PM Curt Bears, MD [facility] None  03/03/2020  1:30 PM [facility] INFUSION [facility] None  03/04/2020 10:00 AM [facility] INFUSION [facility] None  03/05/2020  1:30 PM [facility] INFUSION [facility] None  03/07/2020 11:00 AM [facility] [place] FLUSH [facility] None  03/14/2020 12:30 PM [name], RN [facility] None  05/07/2020 10:20 AM Philemon Kingdom, MD [facility] None  08/13/2020 11:20 AM Copland, Frederico Hamman, MD [facility]    No orders of the defined types were placed in this encounter.       Subjective:   Patient ID:  [name] is a 74 y.o. (DOB Nov 10, 1945) male.  Chief Complaint: No chief complaint on file.   HPI [name]  is a 74 y.o. male with a diagnosis of a limited stage small cell lung cancer.  He is followed by Dr. [name] and is status post cycle 5, day 1 of carboplatin and etoposide which was dosed on 02/11/2020.  He was seen in the flush room today as he reported having a productive cough with yellowish sputum.  He also has been having wheezing.  He denies fevers, chills, sweats, or increased shortness of breath.   Medications: I have reviewed the patient's current medications.  Allergies:  Allergies  Allergen Reactions  . Tetracycline Itching and Rash    Past Medical History:  Diagnosis Date  . Allergic rhinitis due to pollen   . Chronic kidney disease, stage III (moderate) ([place]) 12/19/2012  . Depression   . Diabetes mellitus ([place])   . Diverticulosis   . Erectile dysfunction associated with type 2 diabetes mellitus ([place]) 05/28/2015  . Former very heavy cigarette smoker (more than 40 per day) 10/07/2014  . GERD (gastroesophageal reflux disease)   . Gout   . Hyperlipidemia   . Hypertension   . Hypogonadism male 06/14/2012  . Memory loss   . Non-small cell carcinoma of right lung, stage 3 ([place]) 10/23/2019  . Osteoarthritis   . Personal history of colonic polyps    1996    Past Surgical History:  Procedure Laterality Date  . BRONCHIAL NEEDLE ASPIRATION BIOPSY  10/17/2019   Procedure: BRONCHIAL NEEDLE ASPIRATION BIOPSIES;  Surgeon: Juanito Doom, MD;  Location: [facility];  Service: Cardiopulmonary;;  . CARDIAC CATHETERIZATION  11/2011   [facility]  . CHOLECYSTECTOMY  1980  . ENDOBRONCHIAL ULTRASOUND Bilateral 10/17/2019   Procedure: ENDOBRONCHIAL ULTRASOUND;  Surgeon: Juanito Doom, MD;  Location: [facility];  Service: Cardiopulmonary;  Laterality: Bilateral;  . ESOPHAGOGASTRODUODENOSCOPY (EGD) WITH PROPOFOL N/A 09/27/2017   Procedure:  ESOPHAGOGASTRODUODENOSCOPY (EGD) WITH PROPOFOL;  Surgeon: Lin Landsman, MD;  Location: [facility];  Service: Gastroenterology;  Laterality: N/A;  . EYE SURGERY Right 07/29/2016   [facility] - Dr. Manuella Ghazi  . EYE SURGERY Left 08/23/2106   [facility] - Dr. Manuella Ghazi  . HEMOSTASIS CONTROL  10/17/2019   Procedure: HEMOSTASIS CONTROL;  Surgeon: Juanito Doom, MD;  [facility]: [facility];  Service: Cardiopulmonary;;  . IR IMAGING GUIDED PORT INSERTION  11/30/2019  . IRRIGATION AND DEBRIDEMENT ABSCESS Left 11/23/2019   Procedure: IRRIGATION AND DEBRIDEMENT ABSCESS;  Surgeon: Melina Schools, MD;  Location: [facility];  Service: Orthopedics;  Laterality: Left;  . TOTAL HIP ARTHROPLASTY  2009   Dr. Gladstone Lighter  . TOTAL HIP ARTHROPLASTY      Family History  Problem Relation Age of Onset  . Diabetes Mother   . Alzheimer's disease Mother   . Diabetes Father   . Lung cancer Father        Age 60  . Stroke Father   . Heart attack Father   . Lung cancer Sister        lung  . Heart disease Maternal Grandfather   . COPD Sister   . Heart attack Sister   . Heart disease Sister     Social History   Socioeconomic History  . Marital status: Widowed    Spouse name: Not on file  . Number of children: 4  . Years of education: 12th  . Highest education level: Not on file  Occupational History  . Occupation: Counsellor  . Occupation: retired Magazine features editor: [employer]  Tobacco Use  . Smoking status: Former Smoker    Packs/day: 3.00    Years: 35.00    Pack years: 105.00    Types: Cigarettes    Quit date: 04/12/2000    Years since quitting: 19.8  . Smokeless tobacco: Never Used  Vaping Use  . Vaping Use: Never used  Substance and Sexual Activity  . Alcohol use: No  . Drug use: No  . Sexual activity: Never  Other Topics Concern  . Not on file  Social History Narrative   No regular exercise   Caffeine use: yes, dt coke   Lives at home with his mother lives with him.   Wife Fraser Din  recently deceased   Right-handed.   Social Determinants of Health   Financial Resource Strain: Low Risk   . Difficulty of Paying Living Expenses: Not hard at all  Food Insecurity: No Food Insecurity  . Worried About Charity fundraiser in the Last Year: Never true  . Ran Out of Food in the Last Year: Never true  Transportation Needs: No Transportation Needs  . Lack of Transportation (Medical): No  . Lack of Transportation (Non-Medical): No  Physical Activity: Inactive  . Days of Exercise per Week: 0 days  . Minutes of Exercise per Session: 0 min  Stress: No Stress Concern Present  . Feeling of Stress : Not at all  Social Connections:   . Frequency of Communication with Friends and Family: Not on file  . Frequency of Social Gatherings with Friends and Family: Not on file  .  Attends Religious Services: Not on file  . Active Member of Clubs or Organizations: Not on file  . Attends Archivist Meetings: Not on file  . Marital Status: Not on file  Intimate Partner Violence: Not At Risk  . Fear of Current or Ex-Partner: No  . Emotionally Abused: No  . Physically Abused: No  . Sexually Abused: No    Past Medical History, Surgical history, Social history, and Family history were reviewed and updated as appropriate.   Please see review of systems for further details on the patient's review from today.   Review of Systems:  Review of Systems  Constitutional: Negative for chills, diaphoresis, fatigue and fever.  HENT: Negative for congestion, postnasal drip, rhinorrhea and sore throat.   Respiratory: Positive for cough and wheezing. Negative for shortness of breath.   Cardiovascular: Negative for palpitations.  Neurological: Negative for headaches.    Objective:   Physical Exam:  There were no vitals taken for this visit. ECOG: 1  Physical Exam Constitutional:      General: He is not in acute distress.    Appearance: He is not diaphoretic.  HENT:     Head:  Normocephalic and atraumatic.     Right Ear: External ear normal.     Left Ear: External ear normal.     Mouth/Throat:     Pharynx: No oropharyngeal exudate.  Cardiovascular:     Rate and Rhythm: Normal rate and regular rhythm.     Heart sounds: Normal heart sounds. No murmur heard.  No friction rub. No gallop.   Pulmonary:     Effort: Pulmonary effort is normal. No respiratory distress.     Breath sounds: Examination of the right-upper field reveals wheezing. Examination of the left-upper field reveals wheezing. Examination of the right-middle field reveals wheezing. Examination of the left-middle field reveals wheezing. Examination of the right-lower field reveals wheezing. Examination of the left-lower field reveals wheezing. Wheezing present. No rales.  Musculoskeletal:     Cervical back: Normal range of motion and neck supple.  Skin:    General: Skin is warm and dry.     Findings: No erythema or rash.  Neurological:     Mental Status: He is alert.     Coordination: Coordination normal.     Gait: Gait abnormal (The patient is ambulating with the use of a wheelchair.).  Psychiatric:        Behavior: Behavior normal.        Thought Content: Thought content normal.        Judgment: Judgment normal.     Lab Review:     Component Value Date/Time   NA 132 (L) 02/18/2020 1215   NA 130 (L) 11/25/2011 1119   NA 137 11/16/2011 0513   K 4.2 02/18/2020 1215   K 4.1 11/16/2011 0513   CL 97 (L) 02/18/2020 1215   CL 100 11/16/2011 0513   CO2 29 02/18/2020 1215   CO2 29 11/16/2011 0513   GLUCOSE 359 (H) 02/18/2020 1215   GLUCOSE 239 (H) 11/16/2011 0513   BUN 20 02/18/2020 1215   BUN 25 11/25/2011 1119   BUN 15 11/16/2011 0513   CREATININE 1.22 02/18/2020 1215   CREATININE 1.26 11/16/2011 0513   CALCIUM 8.3 (L) 02/18/2020 1215   CALCIUM 9.1 11/16/2011 0513   PROT 6.1 (L) 02/18/2020 1215   ALBUMIN 2.6 (L) 02/18/2020 1215   AST 9 (L) 02/18/2020 1215   ALT 8 02/18/2020 1215    ALKPHOS 133 (H)  02/18/2020 1215   BILITOT 0.6 02/18/2020 1215   GFRNONAA >60 02/18/2020 1215   GFRNONAA 59 (L) 11/16/2011 0513   GFRAA >60 01/14/2020 1200   GFRAA >60 11/16/2011 0513       Component Value Date/Time   WBC 11.7 (H) 02/18/2020 1215   WBC 6.7 11/28/2019 0550   RBC 2.59 (L) 02/18/2020 1215   HGB 7.8 (L) 02/18/2020 1215   HGB 12.8 (L) 11/16/2011 0513   HCT 22.1 (L) 02/18/2020 1215   HCT 36.1 (L) 11/16/2011 0513   PLT 90 (L) 02/18/2020 1215   PLT 205 11/16/2011 0513   MCV 85.3 02/18/2020 1215   MCV 90 11/16/2011 0513   MCH 30.1 02/18/2020 1215   MCHC 35.3 02/18/2020 1215   RDW 14.1 02/18/2020 1215   RDW 13.1 11/16/2011 0513   LYMPHSABS 0.2 (L) 02/18/2020 1215   LYMPHSABS 1.8 11/16/2011 0513   MONOABS 0.2 02/18/2020 1215   MONOABS 0.5 11/16/2011 0513   EOSABS 0.0 02/18/2020 1215   EOSABS 0.1 11/16/2011 0513   BASOSABS 0.1 02/18/2020 1215   BASOSABS 0.0 11/16/2011 0513   -------------------------------  Imaging from last 24 hours (if applicable):  Radiology interpretation: CT Chest Wo Contrast  Result Date: 02/07/2020 CLINICAL DATA:  Primary Cancer Type: Lung Imaging Indication: Assess response to therapy Interval therapy since last imaging? Yes Initial Cancer Diagnosis Date: 10/17/2019; Established by: Biopsy-proven Detailed Pathology:  Limited stage small cell lung cancer. Primary Tumor location: Right lower lobe/right hilar mass with obstruction of the right lower lobe bronchus. Surgeries: Cardiac catheterization.  Cholecystectomy. Chemotherapy: Yes; Ongoing? Yes; Most recent administration: 01/21/2020 Immunotherapy? No Radiation therapy? Yes; Date Range: 10/18/2019 - 11/28/2019; Target: Right lung EXAM: CT CHEST WITHOUT CONTRAST TECHNIQUE: Multidetector CT imaging of the chest was performed following the standard protocol without IV contrast. COMPARISON:  Most recent CT chest 12/27/2019.  11/01/2019 PET-CT. FINDINGS: Cardiovascular: Aortic and branch vessel  atherosclerosis. Right Port-A-Cath tip high right atrium. Normal heart size, without pericardial effusion. Multivessel coronary artery atherosclerosis. Mediastinum/Nodes: Right-sided thyroid nodule again identified at approximately 1.0 cm. Not clinically significant; no follow-up imaging recommended (ref: J Am Coll Radiol. 2015 Feb;12(2): 143-50).No supraclavicular adenopathy. Right paratracheal node measures 1.9 x 2.9 cm on 53/2 versus 2.1 x 2.9 cm on the prior exam (when remeasured). A node within the subcarinal station with extension into the azygoesophageal recess measures 1.4 cm on 75/2 versus 1.5 cm on the prior exam (when remeasured). Lungs/Pleura: No pleural fluid. Obstruction of the superior segment right lower lobe bronchus is similar, including on 68/5. Moderate centrilobular emphysema A posteromedial right upper lobe 3 mm nodule on 46/5 is similar. The central superior segment right lower lobe (i.e. Perihilar) lung mass measures 4.7 x 3.4 cm on 64/2. Minimally decreased from 4.9 x 3.7 cm on the prior exam when measured similarly. Interval development of right greater than left, peribronchovascular predominant ground-glass and airspace opacities many of which are nodular. Example in the right upper lobe on 63/5, right lower lobe at up to 9 mm on 80/5 and more medial right lower lobe on 88/5. 1.1 cm sub solid right lower lobe pulmonary nodule is unchanged on 106/5. Upper Abdomen: Cholecystectomy. Normal imaged portions of the liver, spleen, stomach, pancreas, adrenal glands, kidneys. Descending duodenal diverticulum. Musculoskeletal: Nonacute anterior left rib fractures. Upper thoracic spondylosis. IMPRESSION: 1. Mild response to therapy of central superior segment right lower lobe (perihilar) lung mass. 2. Development of right greater than left peribronchovascular ground-glass and nodular airspace disease. Not a typical appearance for  radiation change. Favored to represent infection. Correlate with  symptoms. 3. Similar to minimal improvement in thoracic adenopathy. 4. Aortic atherosclerosis (ICD10-I70.0), coronary artery atherosclerosis and emphysema (ICD10-J43.9). 5. Similar sub solid right lower lobe pulmonary nodule. Electronically Signed   By: Abigail Miyamoto M.D.   On: 02/07/2020 09:51

## 2020-02-20 ENCOUNTER — Other Ambulatory Visit: Payer: Self-pay

## 2020-02-20 ENCOUNTER — Encounter: Payer: Medicare Other | Attending: Physician Assistant | Admitting: Internal Medicine

## 2020-02-20 DIAGNOSIS — E1142 Type 2 diabetes mellitus with diabetic polyneuropathy: Secondary | ICD-10-CM | POA: Insufficient documentation

## 2020-02-20 DIAGNOSIS — L97521 Non-pressure chronic ulcer of other part of left foot limited to breakdown of skin: Secondary | ICD-10-CM | POA: Diagnosis not present

## 2020-02-20 DIAGNOSIS — M21372 Foot drop, left foot: Secondary | ICD-10-CM | POA: Diagnosis not present

## 2020-02-20 DIAGNOSIS — E11621 Type 2 diabetes mellitus with foot ulcer: Secondary | ICD-10-CM | POA: Insufficient documentation

## 2020-02-20 DIAGNOSIS — L97522 Non-pressure chronic ulcer of other part of left foot with fat layer exposed: Secondary | ICD-10-CM | POA: Diagnosis not present

## 2020-02-20 DIAGNOSIS — L97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scle: Secondary | ICD-10-CM | POA: Insufficient documentation

## 2020-02-20 DIAGNOSIS — C3490 Malignant neoplasm of unspecified part of unspecified bronchus or lung: Secondary | ICD-10-CM | POA: Insufficient documentation

## 2020-02-20 DIAGNOSIS — L89899 Pressure ulcer of other site, unspecified stage: Secondary | ICD-10-CM | POA: Diagnosis not present

## 2020-02-20 DIAGNOSIS — Z794 Long term (current) use of insulin: Secondary | ICD-10-CM | POA: Insufficient documentation

## 2020-02-21 NOTE — Progress Notes (Signed)
KAYDENCE, BABA ([id number]) Visit Report for 02/20/2020 Arrival Information Details Patient Name: [name], [name] Date of Service: 02/20/2020 11:00 AM Medical Record Number: [id number] Patient Account Number: [id number] Date of Birth/Sex: [birth year] (74 y.o. M) Treating RN: Dolan Amen Primary Care Hobson Lax: [name] Other Clinician: Referring Vibha Ferdig: [name] Treating Raylan Troiani/Extender: Tito Dine in Treatment: 11 Visit Information History Since Last Visit Pain Present Now: No Patient Arrived: Wheel Chair Arrival Time: 11:18 Accompanied By: self Transfer Assistance: None Patient Identification Verified: Yes Secondary Verification Process Completed: Yes Patient Requires Transmission-Based Precautions: No Electronic Signature(s) Signed: 02/20/2020 4:38:43 PM By: Georges Mouse, Minus Breeding Entered By: Georges Mouse, Minus Breeding on 02/20/2020 11:18:58 [name] ([id number]) -------------------------------------------------------------------------------- Encounter Discharge Information Details Patient Name: [name] Date of Service: 02/20/2020 11:00 AM Medical Record Number: [id number] Patient Account Number: [id number] Date of Birth/Sex: [birth year] (74 y.o. M) Treating RN: Dolan Amen Primary Care Breleigh Carpino: [name] Other Clinician: Referring Scotty Weigelt: [name] Treating Cherylyn Sundby/Extender: Tito Dine in Treatment: 11 Encounter Discharge Information Items Post Procedure Vitals Discharge Condition: Stable Temperature (F): 98.1 Ambulatory Status: Wheelchair Pulse (bpm): 132 Discharge Destination: Home Respiratory Rate (breaths/min): 18 Transportation: Private Auto Blood Pressure (mmHg): 112/69 Accompanied By: self Schedule Follow-up Appointment: Yes Clinical Summary of Care: Electronic Signature(s) Signed: 02/20/2020 4:38:43 PM By: Georges Mouse, Minus Breeding Entered By: Georges Mouse, Minus Breeding on  02/20/2020 12:11:33 [name] ([id number]) -------------------------------------------------------------------------------- Lower Extremity Assessment Details Patient Name: [name] Date of Service: 02/20/2020 11:00 AM Medical Record Number: [id number] Patient Account Number: [id number] Date of Birth/Sex: 1945/07/17 (74 y.o. M) Treating RN: Dolan Amen Primary Care Cyree Chuong: [name] Other Clinician: Referring Berkeley Vanaken: [name] Treating Meryl Ponder/Extender: Ricard Dillon Weeks in Treatment: 11 Edema Assessment Assessed: [Left: No] [Right: No] Edema: [Left: No] [Right: No] Vascular Assessment Pulses: Dorsalis Pedis Palpable: [Left:Yes] [Right:Yes] Electronic Signature(s) Signed: 02/20/2020 4:38:43 PM By: Georges Mouse, Minus Breeding Entered By: Georges Mouse, Minus Breeding on 02/20/2020 11:36:30 [name] ([id number]) -------------------------------------------------------------------------------- Multi Wound Chart Details Patient Name: [name] Date of Service: 02/20/2020 11:00 AM Medical Record Number: [id number] Patient Account Number: [id number] Date of Birth/Sex: [birth year] (74 y.o. M) Treating RN: Cornell Barman Primary Care Jasime Westergren: [name] Other Clinician: Referring Nalina Yeatman: [name] Treating Nasim Garofano/Extender: Tito Dine in Treatment: 11 Vital Signs Height(in): 71 Pulse(bpm): 132 Weight(lbs): 190 Blood Pressure(mmHg): 112/69 Body Mass Index(BMI): 26 Temperature(F): 98.1 Respiratory Rate(breaths/min): 18 Photos: [N/A:N/A] Wound Location: Left, Lateral, Plantar Foot Right, Dorsal Foot N/A Wounding Event: Surgical Injury Pressure Injury N/A Primary Etiology: Diabetic Wound/Ulcer of the Lower Diabetic Wound/Ulcer of the Lower N/A Extremity Extremity Secondary Etiology: N/A Pressure Ulcer N/A Comorbid History: Cataracts, Middle ear problems, Cataracts, Middle ear problems, N/A Sleep Apnea,  Hypertension, Type II Sleep Apnea, Hypertension, Type II Diabetes, Gout, Osteoarthritis, Diabetes, Gout, Osteoarthritis, Neuropathy, Received Neuropathy, Received Chemotherapy Chemotherapy Date Acquired: 11/23/2019 01/23/2020 N/A Weeks of Treatment: 11 2 N/A Wound Status: Open Open N/A Measurements L x W x D (cm) 1.1x1.1x0.1 1x0.6x0.3 N/A Area (cm) : 0.95 0.471 N/A Volume (cm) : 0.095 0.141 N/A % Reduction in Area: 85.10% -50.00% N/A % Reduction in Volume: 97.00% -354.80% N/A Classification: Grade 3 Grade 1 N/A Exudate Amount: Medium Small N/A Exudate Type: Serosanguineous Serosanguineous N/A Exudate Color: red, brown red, brown N/A Wound Margin: Thickened Flat and Intact N/A Granulation Amount: Small (1-33%) Small (1-33%) N/A Granulation Quality: Red, Pink, Hyper-granulation Pink N/A Necrotic Amount: Small (1-33%) Large (67-100%) N/A Exposed Structures: Fat Layer (Subcutaneous Tissue): Fat Layer (Subcutaneous  Tissue): N/A Yes Yes Fascia: No Fascia: No Tendon: No Tendon: No Muscle: No Muscle: No Joint: No Joint: No Bone: No Bone: No Epithelialization: Small (1-33%) Small (1-33%) N/A Debridement: Debridement - Excisional Debridement - Excisional N/A Pre-procedure Verification/Time 11:44 11:43 N/A Out Taken: Pain Control: Lidocaine 4% Topical Solution Lidocaine 4% Topical Solution N/A Tissue Debrided: Callus, Subcutaneous Subcutaneous, Slough N/A Level: Skin/Subcutaneous Tissue Skin/Subcutaneous Tissue N/A Debridement Area (sq cm): 1.21 0.6 N/A Instrument: Curette Curette N/A Bleeding: Minimum Minimum N/A [name], [name] ([id number]) Hemostasis Achieved: Pressure Pressure N/A Procedural Pain: 0 0 N/A Post Procedural Pain: 0 0 N/A Debridement Treatment Procedure was tolerated well Procedure was tolerated well N/A Response: Post Debridement 1.1x1.1x0.2 1x0.6x0.5 N/A Measurements L x W x D (cm) Post Debridement Volume: 0.19 0.236 N/A (cm) Procedures Performed:  Debridement Debridement N/A Treatment Notes Electronic Signature(s) Signed: 02/21/2020 12:18:17 PM By: Linton Ham MD Previous Signature: 02/20/2020 11:49:43 AM Version By: Gretta Cool, BSN, RN, CWS, Kim RN, BSN Entered By: Linton Ham on 02/20/2020 11:57:35 [name] ([id number]) -------------------------------------------------------------------------------- Multi-Disciplinary Care Plan Details Patient Name: [name] Date of Service: 02/20/2020 11:00 AM Medical Record Number: [id number] Patient Account Number: [id number] Date of Birth/Sex: [birth year] (74 y.o. M) Treating RN: Cornell Barman Primary Care Raydin Bielinski: [name] Other Clinician: Referring Marcellina Jonsson: [name] Treating Hansini Clodfelter/Extender: Tito Dine in Treatment: 11 Active Inactive Necrotic Tissue Nursing Diagnoses: Impaired tissue integrity related to necrotic/devitalized tissue Goals: Necrotic/devitalized tissue will be minimized in the wound bed Date Initiated: 12/03/2019 Target Resolution Date: 12/07/2019 Goal Status: Active Interventions: Assess patient pain level pre-, during and post procedure and prior to discharge Treatment Activities: Apply topical anesthetic as ordered : 12/03/2019 Notes: Orientation to the Wound Care Program Nursing Diagnoses: Knowledge deficit related to the wound healing center program Goals: Patient/caregiver will verbalize understanding of the [place] Date Initiated: 12/03/2019 Target Resolution Date: 12/07/2019 Goal Status: Active Interventions: Provide education on orientation to the wound center Notes: Pain, Acute or Chronic Nursing Diagnoses: Pain Management - Cyclic Acute (Dressing Change Related) Goals: Patient will verbalize adequate pain control and receive pain control interventions during procedures as needed Date Initiated: 12/03/2019 Target Resolution Date: 12/07/2019 Goal Status: Active Interventions: Assess  comfort goal upon admission Treatment Activities: Administer pain control measures as ordered : 12/03/2019 Notes: Wound/Skin Impairment Nursing Diagnoses: Impaired tissue integrity [name], [name] ([id number]) Goals: Ulcer/skin breakdown will have a volume reduction of 30% by week 4 Date Initiated: 12/03/2019 Target Resolution Date: 01/03/2020 Goal Status: Active Ulcer/skin breakdown will have a volume reduction of 50% by week 8 Date Initiated: 12/03/2019 Target Resolution Date: 02/02/2020 Goal Status: Active Ulcer/skin breakdown will have a volume reduction of 80% by week 12 Date Initiated: 12/03/2019 Target Resolution Date: 03/04/2020 Goal Status: Active Interventions: Assess ulceration(s) every visit Treatment Activities: Referred to DME Irlene Crudup for dressing supplies : 12/03/2019 Topical wound management initiated : 12/03/2019 Notes: Electronic Signature(s) Signed: 02/20/2020 11:49:31 AM By: Gretta Cool, BSN, RN, CWS, Kim RN, BSN Entered By: Gretta Cool, BSN, RN, CWS, Kim on 02/20/2020 11:49:30 [name] ([id number]) -------------------------------------------------------------------------------- Pain Assessment Details Patient Name: [name] Date of Service: 02/20/2020 11:00 AM Medical Record Number: [id number] Patient Account Number: [id number] Date of Birth/Sex: [birth year] (74 y.o. M) Treating RN: Dolan Amen Primary Care Marjon Doxtater: [name] Other Clinician: Referring James Lafalce: [name] Treating Jamani Eley/Extender: Tito Dine in Treatment: 11 Active Problems Location of Pain Severity and Description of Pain Patient Has Paino No Site Locations Rate the pain. Current Pain  Level: 0 Pain Management and Medication Current Pain Management: Electronic Signature(s) Signed: 02/20/2020 4:38:43 PM By: Georges Mouse, Minus Breeding Entered By: Georges Mouse, Minus Breeding on 02/20/2020 11:23:05 [name]  ([id number]) -------------------------------------------------------------------------------- Patient/Caregiver Education Details Patient Name: [name] Date of Service: 02/20/2020 11:00 AM Medical Record Number: [id number] Patient Account Number: [id number] Date of Birth/Gender: [birth year] (74 y.o. M) Treating RN: Dolan Amen Primary Care Physician: [name] Other Clinician: Referring Physician: [name] Treating Physician/Extender: Tito Dine in Treatment: 11 Education Assessment Education Provided To: Patient Education Topics Provided Offloading: Methods: Demonstration, Explain/Verbal Responses: Reinforcements needed, State content correctly Wound Debridement: Methods: Demonstration, Explain/Verbal Responses: State content correctly Electronic Signature(s) Signed: 02/20/2020 4:38:43 PM By: Georges Mouse, Minus Breeding Entered By: Georges Mouse, Minus Breeding on 02/20/2020 12:18:57 [name] ([id number]) -------------------------------------------------------------------------------- Wound Assessment Details Patient Name: [name] Date of Service: 02/20/2020 11:00 AM Medical Record Number: [id number] Patient Account Number: [id number] Date of Birth/Sex: 1945/04/19 (74 y.o. M) Treating RN: Dolan Amen Primary Care Shayaan Parke: [name] Other Clinician: Referring Adonias Demore: [name] Treating Nahun Kronberg/Extender: Tito Dine in Treatment: 11 Wound Status Wound Number: 7 Primary Diabetic Wound/Ulcer of the Lower Extremity Etiology: Wound Location: Left, Lateral, Plantar Foot Wound Open Wounding Event: Surgical Injury Status: Date Acquired: 11/23/2019 Comorbid Cataracts, Middle ear problems, Sleep Apnea, Weeks Of Treatment: 11 History: Hypertension, Type II Diabetes, Gout, Osteoarthritis, Clustered Wound: No Neuropathy, Received Chemotherapy Photos Wound Measurements Length: (cm) 1.1 Width: (cm)  1.1 Depth: (cm) 0.1 Area: (cm) 0.95 Volume: (cm) 0.095 % Reduction in Area: 85.1% % Reduction in Volume: 97% Epithelialization: Small (1-33%) Tunneling: No Undermining: No Wound Description Classification: Grade 3 Wound Margin: Thickened Exudate Amount: Medium Exudate Type: Serosanguineous Exudate Color: red, brown Foul Odor After Cleansing: No Slough/Fibrino Yes Wound Bed Granulation Amount: Small (1-33%) Exposed Structure Granulation Quality: Red, Pink, Hyper-granulation Fascia Exposed: No Necrotic Amount: Small (1-33%) Fat Layer (Subcutaneous Tissue) Exposed: Yes Necrotic Quality: Adherent Slough Tendon Exposed: No Muscle Exposed: No Joint Exposed: No Bone Exposed: No Treatment Notes Wound #7 (Left, Lateral, Plantar Foot) Notes Hblue to left lateral plantar foot and right dorsal foot, bordered foam dressing, OFFLOADING shoe with peg assist Electronic Signature(s) [name], [name] ([id number]) Signed: 02/20/2020 4:38:43 PM By: Georges Mouse, Minus Breeding Entered By: Georges Mouse, Minus Breeding on 02/20/2020 11:31:55 [name] ([id number]) -------------------------------------------------------------------------------- Wound Assessment Details Patient Name: [name] Date of Service: 02/20/2020 11:00 AM Medical Record Number: [id number] Patient Account Number: [id number] Date of Birth/Sex: 1945-07-17 (74 y.o. M) Treating RN: Dolan Amen Primary Care Lamonta Cypress: [name] Other Clinician: Referring Nichalas Coin: [name] Treating Azani Brogdon/Extender: Tito Dine in Treatment: 11 Wound Status Wound Number: 9 Primary Diabetic Wound/Ulcer of the Lower Extremity Etiology: Wound Location: Right, Dorsal Foot Secondary Pressure Ulcer Wounding Event: Pressure Injury Etiology: Date Acquired: 01/23/2020 Wound Open Weeks Of Treatment: 2 Status: Clustered Wound: No Comorbid Cataracts, Middle ear problems, Sleep Apnea, History: Hypertension,  Type II Diabetes, Gout, Osteoarthritis, Neuropathy, Received Chemotherapy Photos Wound Measurements Length: (cm) 1 Width: (cm) 0.6 Depth: (cm) 0.3 Area: (cm) 0.471 Volume: (cm) 0.141 % Reduction in Area: -50% % Reduction in Volume: -354.8% Epithelialization: Small (1-33%) Tunneling: No Undermining: No Wound Description Classification: Grade 1 Wound Margin: Flat and Intact Exudate Amount: Small Exudate Type: Serosanguineous Exudate Color: red, brown Foul Odor After Cleansing: No Slough/Fibrino Yes Wound Bed Granulation Amount: Small (1-33%) Exposed Structure Granulation Quality: Pink Fascia Exposed: No Necrotic Amount: Large (67-100%) Fat Layer (Subcutaneous Tissue) Exposed: Yes Necrotic Quality: Adherent Slough  Tendon Exposed: No Muscle Exposed: No Joint Exposed: No Bone Exposed: No Treatment Notes Wound #9 (Right, Dorsal Foot) Notes Hblue to left lateral plantar foot and right dorsal foot, bordered foam dressing, OFFLOADING shoe with peg assist [name], [name] ([id number]) Electronic Signature(s) Signed: 02/20/2020 4:38:43 PM By: Georges Mouse, Minus Breeding Entered By: Georges Mouse, Minus Breeding on 02/20/2020 11:49:13 [name] ([id number]) -------------------------------------------------------------------------------- Prospect Details Patient Name: [name] Date of Service: 02/20/2020 11:00 AM Medical Record Number: [id number] Patient Account Number: [id number] Date of Birth/Sex: 02-Nov-1945 (74 y.o. M) Treating RN: Dolan Amen Primary Care Oshen Wlodarczyk: [name] Other Clinician: Referring Evy Lutterman: [name] Treating Clementina Mareno/Extender: Tito Dine in Treatment: 11 Vital Signs Time Taken: 11:19 Temperature (F): 98.1 Height (in): 71 Pulse (bpm): 132 Weight (lbs): 190 Respiratory Rate (breaths/min): 18 Body Mass Index (BMI): 26.5 Blood Pressure (mmHg): 112/69 Reference Range: 80 - 120 mg / dl Electronic  Signature(s) Signed: 02/20/2020 4:38:43 PM By: Georges Mouse, Minus Breeding Entered By: Georges Mouse, Minus Breeding on 02/20/2020 11:21:59

## 2020-02-21 NOTE — Progress Notes (Signed)
Dustin, [name] ([id number]) Visit Report for 02/20/2020 Debridement Details Patient Name: Dustin, [name] Date of Service: 02/20/2020 11:00 AM Medical Record Number: [id number] Patient Account Number: [id number] Date of Birth/Sex: [birth year] (74 y.o. M) Treating RN: Cornell Barman Primary Care Provider: [name] Other Clinician: Referring Provider: [name] Treating Provider/Extender: Tito Dine in Treatment: 11 Debridement Performed for Wound #7 Left,Lateral,Plantar Foot Assessment: Performed By: Physician Ricard Dillon, MD Debridement Type: Debridement Severity of Tissue Pre Debridement: Fat layer exposed Level of Consciousness (Pre- Awake and Alert procedure): Pre-procedure Verification/Time Out Yes - 11:44 Taken: Start Time: 11:44 Pain Control: Lidocaine 4% Topical Solution Total Area Debrided (L x W): 1.1 (cm) x 1.1 (cm) = 1.21 (cm) Tissue and other material Non-Viable, Callus, Subcutaneous debrided: Level: Skin/Subcutaneous Tissue Debridement Description: Excisional Instrument: Curette Bleeding: Minimum Hemostasis Achieved: Pressure End Time: 11:45 Procedural Pain: 0 Post Procedural Pain: 0 Response to Treatment: Procedure was tolerated well Level of Consciousness (Post- Awake and Alert procedure): Post Debridement Measurements of Total Wound Length: (cm) 1.1 Width: (cm) 1.1 Depth: (cm) 0.2 Volume: (cm) 0.19 Character of Wound/Ulcer Post Debridement: Stable Severity of Tissue Post Debridement: Limited to breakdown of skin Post Procedure Diagnosis Same as Pre-procedure Electronic Signature(s) Signed: 02/20/2020 6:49:12 PM By: Gretta Cool, BSN, RN, CWS, Kim RN, BSN Signed: 02/21/2020 12:18:17 PM By: Linton Ham MD Entered By: Linton Ham on 02/20/2020 11:57:49 [name] ([id number]) -------------------------------------------------------------------------------- Debridement Details Patient Name: [name] Date of Service: 02/20/2020 11:00 AM Medical Record Number: [id number] Patient Account Number: [id number] Date of Birth/Sex: 1945/06/28 (74 y.o. M) Treating RN: Cornell Barman Primary Care Provider: [name] Other Clinician: Referring Provider: [name] Treating Provider/Extender: Tito Dine in Treatment: 11 Debridement Performed for Wound #9 Right,Dorsal Foot Assessment: Performed By: Physician Ricard Dillon, MD Debridement Type: Debridement Severity of Tissue Pre Debridement: Fat layer exposed Level of Consciousness (Pre- Awake and Alert procedure): Pre-procedure Verification/Time Out Yes - 11:43 Taken: Start Time: 11:43 Pain Control: Lidocaine 4% Topical Solution Total Area Debrided (L x W): 1 (cm) x 0.6 (cm) = 0.6 (cm) Tissue and other material Viable, Non-Viable, Slough, Subcutaneous, Slough debrided: Level: Skin/Subcutaneous Tissue Debridement Description: Excisional Instrument: Curette Bleeding: Minimum Hemostasis Achieved: Pressure Procedural Pain: 0 Post Procedural Pain: 0 Response to Treatment: Procedure was tolerated well Level of Consciousness (Post- Awake and Alert procedure): Post Debridement Measurements of Total Wound Length: (cm) 1 Width: (cm) 0.6 Depth: (cm) 0.5 Volume: (cm) 0.236 Character of Wound/Ulcer Post Debridement: Stable Severity of Tissue Post Debridement: Fat layer exposed Post Procedure Diagnosis Same as Pre-procedure Electronic Signature(s) Signed: 02/20/2020 6:49:12 PM By: Gretta Cool, BSN, RN, CWS, Kim RN, BSN Signed: 02/21/2020 12:18:17 PM By: Linton Ham MD Entered By: Linton Ham on 02/20/2020 11:58:01 [name] ([id number]) -------------------------------------------------------------------------------- HPI Details Patient Name: [name] Date of Service: 02/20/2020 11:00 AM Medical Record Number: [id number] Patient Account Number: [id number] Date of Birth/Sex: 1945/11/27 (74  y.o. M) Treating RN: Cornell Barman Primary Care Provider: [name] Other Clinician: Referring Provider: [name] Treating Provider/Extender: Tito Dine in Treatment: 11 History of Present Illness HPI Description: 10/04/16 on evaluation today patient presents for initial evaluation concerning a laceration of the right wrist and hand on the bowler aspect. He tells me that he fell into glass following a medication change in regard to his diabetes. He initially went to [facility] where she was sutured and subsequently his daughter who is a Marine scientist removed the teachers at the  appropriate time. Unfortunately the wound has done poorly and dehissed which has caused him some trouble including infection. He has been on antibiotics both topically and orally though the wound continues to show signs of necrotic tissue according to notes which is what he was sent to [place] for evaluation concerning. He does have diabetes and unfortunately this is severely uncontrolled. He does see an endocrinologist and is working on this. 10/11/2016 -- the patient's notes from the ER were reviewed and he had his suturing done on 09/17/2016 and had necrotic skin flaps which had to be trimmed the last visit he was here. He has managed to get his Santyl ointment and has been using this appropriately. 10/21/16 patient's wound appears to be doing very well on evaluation today. He still has some Slough covering the wound bed but this is nowhere near as severe as when i saw this initially two weeks ago nor even my review of his pictures last week. I'm pleased with how this is progressing. Patient's wound over the right form appears to be doing fairly well on evaluation today. There is no evidence of infection and he has a small area at the crease of where she is wrist extends and flexes that is still open and appears to be having difficulty due to the fact that it is cracking open. I think this is something that  may be controlled with moisture control that is keeping the wound a little bit more moist. Nonetheless this is a tiny region compared to the initial wound and overall appears to be doing very well. No fevers, chills, nausea, or vomiting noted at this time. 11/25/16 on evaluation today patient appears to be doing well in regard to his right wrist ulcer. In fact this appears to be completely healed he is having no discomfort. He is pleased with how this is progressed. Readmission: 06/24/17 on evaluation today patient appears back in our clinic regarding issues that he has been having with his right ankle on the lateral malleolus as well is in the dorsal surface of his left foot. Both have been going on for several weeks unfortunately. He tells me that initially this was being managed by Dr. Edilia Bo and I did review the notes from Dr. Edilia Bo in epic. Patient was placed on amoxicillin initially and then subsequently Keflex following. He has been using triple antibiotic ointment over-the-counter along with a Band-Aid for the wound currently. He did not have any Santyl remaining. His most recent hemoglobin A1c which was just two days ago was 10.3. Fortunately he seems to have excellent blood flow in the bilateral lower extremities with his left ABI being 0.95 in the right ABI 1.01. Patient does have some pain in regard to each of these ulcerations but he tells me it's not nearly as severe as the hand wound that I help treat earlier over the summer. This was in 2018. 07/15/17 on evaluation today patient's lower surety ulcer is actually both appear to be doing fairly well at this point. He does have a little bit of skin overlapping the ulcer on the right lateral ankle this was debrided away today just with saline and gauze without complication patient had no discomfort or pain. Otherwise patient's left foot ulcer does appear to be showing signs of improvement he does seem to have a right great toe. Nikki and  noted at this point in time. I think that this does need to be managed at this point. No fevers, chills, nausea, or vomiting noted  at this time. 07/22/17 on evaluation today patient's wounds in regard to his lower extremities appear to be doing somewhat better. His right ankle wound in fact appears to likely be completely closed. The left dorsal foot ulcer though still open does appear to be a little bit smaller and does also seem to be making good progress. He has not been having problems with the treatment at this point in the general he does not seem to show as much erythema surrounding the wound bed which is good news. He is not having significant discomfort. 08/12/17 on evaluation today patients wounds of both appeared to be doing better in fact they both appear to be completely healed. Overall I'm very pleased with how things have progressed over the past week even more than what I expected. Obviously this is good news and he is happy about this. He did have a fall when going to his orthopedic doctor recently fortunately he does not appear to have injured anything severely but nonetheless does have a couple of bumps and bruises nothing significant at this time but he needs to be seen and wound care for. He may have broken his left wrist he is following up with orthopedics in that regard. Readmission: 10/24/17 on evaluation today patient presents for reevaluation after having been discharged Aug 12, 2017. His wound was healed at that point although he states that it has been somewhat discolored since that time. He has not experienced the drainage as far as the wound is concerned but the fact that it was still discolored been greater than two months after the fact made him want to come in for reevaluation. Upon inspection today the patient has no pain although he does have neuropathy. He does have a discolored region of the dorsal surface of the left foot which was the site where the ulcer was that we  previously treated. The good news is he does not again have any discomfort although obviously I do believe this may be some scar tissue and just discoloration in that regard. Again he hasn't had any drainage or discharge since I last saw him and at this point I see no evidence of that either this area shows complete epithelialization. In general I do not feel like there's any significant issue at this point in that regard. 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 He has been in this clinic before in 2018 with a laceration on his right hand and then again in 2019 with an area over the right lateral malleolus. 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 He states that the wound is been there for the last week or STONEY, KARCZEWSKI. ([id number]) 2. It is been uncomfortable to walk on. The man is an active man having to look after an elderly mother. Past medical history, type 2 diabetes with polyneuropathy with a recent hemoglobin A1c in September 2011 0.3, lumbar disc disease, hypergonadism., Left foot drop and gout. We did not repeat his ABIs in the clinic quoting values from July 2019 of 0.95 and 1.01. He is not felt to have significant PAD 02/19/2019 on evaluation today patient actually appears to be doing better with regard to his foot wound based on what I am seeing currently. He was seen by Dr. Dellia Nims on readmission back into the clinic last week when I was off.  The good news is the patient has been tolerating the dressing changes without any complication in fact has not really had much in the way of drainage. He does have an AFO brace which she believes is actually pushing on this area as well and has contributed to the issue. Fortunately there is no signs of active infection at this point. His ABIs appear to be well. 03/05/2019 on  evaluation today patient appears to be doing well in regard to his foot ulcer. He has been tolerating the dressing changes without complication. Fortunately there is no signs of active infection. The wound is measuring somewhat smaller and overall I feel like he is making good progress at this point. 03/12/2019 on evaluation today patient actually appears to be doing quite well with regard to his foot ulcer. The wound is looking better the measurement is not significantly better at this point unfortunately but nonetheless again the overall appearance is improving I think he is making some progress. I still think the big issue here is foot drop and again the patient wants to see if potentially he could get a brace to help with this. Subsequently I think that we can definitely make a referral to [facility] to see if they can help [place] in this regard. He is in agreement with that plan. 03/26/2019 on evaluation today patient appears to be doing more poorly in regard to his foot ulcer at this time. He did see podiatry in order to see about getting a brace but unfortunately he tells me that Medicare is not likely to pay for one for him as he had 1 I guess 8 months ago. However this one that apparently goes in his shoes that does not seem to be working out well for him. Unfortunately there are signs of active infection at this time. No fevers, chills, nausea, vomiting, or diarrhea. Systemically. The patient has erythema on the dorsal surface of his foot that tracks from the lateral portion of his foot and again he does have a blister around the wound as well which is worse compared to previous. Overall I am concerned. 04/02/2019 on evaluation today patient appears to be doing about the same with regard to his wound. Unfortunately there is no signs of significant improvement although I do think there is no signs of infection which is good news. His drainage is also slowed down tremendously. I think at this  point he would benefit from the initiation of a total contact cast. The only concern I have is that over his balance. I explained to the patient that if we were to initiate the cast I think it would be greatly beneficial for him but he would need to ensure not to walk at all unless he uses a walker he actually has 2 walkers one for inside his home and one that he keeps out in the car. For that reason he would be covered in that regard. I just do not want him to have any accidents and fall with the cast. 12/28-Patient comes in 1 week out a day early for his appointment he was in the hospital for 2 days for uncontrolled hyperglycemia and required management of high blood sugars, with some adjustments in his home regimen of insulin, patient stubbed his right great toe today and put a Band-Aid on it, he is here for his left lateral foot ulcer and we are using silver alginate dressing, he was intact total contact cast with difficulty with balance to the extent that  that had to be discontinued 04/16/2019 on evaluation today patient appears to be doing better with regard to the overall appearance of his wound size. This is good news. We had attempted a total contact cast but unfortunately he had some falling issues that the family contacted [place] about we took the cast off we did not put it back on. Nonetheless it ended up that just a couple days later he was in the hospital due to elevations in his blood sugar which went to around 500 and even a little higher. Subsequently he was admitted to the hospital and was in the hospital for a total of 3 days and that included Christmas day. Fortunately there got his blood sugar under control he tells me that this is running about 200 now which is much better news. There does not appear to be any signs of active infection at this time. No fevers, chills, nausea, vomiting, or diarrhea. 04/30/2019 on evaluation today patient actually appears to be doing slightly worse  compared to previous as far as the overall size of his wound is concerned. There also is some need for sharp debridement today as well. Fortunately there is no evidence of active infection which is good news. No fevers, chills, nausea, vomiting, or diarrhea. 05/07/2019 upon evaluation today patient appears to be doing excellent in regard to his plantar foot ulcer. He has been tolerating the dressing changes without complication. Fortunately there is no signs of active infection at this time. No fevers, chills, nausea, vomiting, or diarrhea. He does tell me that he actually got in touch with [employer] and they feel like they can add diagnosis codes to his orders in order to get an external brace for him as opposed to the one that goes internal to issue. That would be excellent for his foot drop and I did tell him that if there is anything I need to fill out in that regard I will be more than happy to oblige and fill out what is needed for [employer]. 05/14/2019 upon evaluation today patient's original wound site actually appears to be doing much better which is good news. Fortunately there is no signs of active infection at this time. No fevers, chills, nausea, vomiting, or diarrhea. With that being said the patient unfortunately is continuing to have issues with rubbing he tells me that the felt slid on his foot although he did not realize it until today. Nonetheless I think this did cause a blister around the edge of the wound and he does an open wound that is slightly larger overall in measurement compared to last time mainly due to this blister. Other than that I feel like he is doing quite well. 05/21/2019 upon evaluation today patient appears to be doing excellent in regard to his wound at this time on the plantar foot. He is showing signs of improvement he does have some slough and biofilm noted on the surface of the wound this is going require some sharp debridement today as well. Fortunately there is no  evidence of active infection. No fevers, chills, nausea, vomiting, or diarrhea. 05/28/2019 on evaluation today patient's wound actually appears to be measuring smaller upon initial inspection and I do believe that it is doing well. With that being said he continues to have a blister on the outside/lateral edge of the wound that occurs as a result of rubbing due to his dropfoot currently. Subsequently we have filled out the paperwork for his new external brace but again that does take  several weeks to get that completed once everything is returned to back we did send this back last week as far as the paperwork was concerned. With that being said there does not appear to be any signs of active infection at this time. 06/04/2019 on evaluation today patient's wound actually appears to be doing slightly better compared to last week which is good news. He still states that his insurance unfortunately does not seem to want to cover any of his new brace. He is going to just pay for this himself. Nonetheless he is going by there today when he leaves here that is [employer] in [place] that is going to be making this brace for him. 06/18/2019 upon evaluation today patient fortunately does have his new brace which is the external AFO brace with bilateral uprights. Subsequently he seems to be doing much better in fact the wound already appears better. He has not even been using the felt he is just using a dressing over [place] ([id number]) top of the wound and overall I am very pleased with how things seem to be progressing. There is no signs of active infection at this time which is great news and I think that the AFO is good to help him tremendously as far as trying to keep pressure off of the 06/25/2019 upon evaluation today patient seems to be making good progress in regard to his wound. He has been tolerating the dressing changes without complication. Fortunately there is no signs of active infection at  this time. No fevers, chills, nausea, vomiting, or diarrhea. 07/02/2019 upon evaluation today patient unfortunately had a blistered area on the bottom of his foot tracking more towards the midline of the plantar aspect. He does not seem to have issues with rubbing more laterally that he used to have but nonetheless this is still giving him some trouble and causing problems here. Fortunately there is no signs of active infection at this time. No fevers, chills, nausea, vomiting, or diarrhea. 07/09/2019 upon evaluation today patient appears to be doing well with regard to his plantar foot ulcer. Definitely this is much improved compared to last week's visit which again last week the issue there was that he had an area of callus buildup on the plantar aspect of his foot I was able to clean this away and after effectively doing so the wound seems to be doing much better which is great news. There is no signs of active infection at this time. 07/16/2019 upon evaluation today patient appears to be making some progress in my opinion with regard to his plantar foot ulcer. He has been tolerating the dressing changes without complication. Fortunately there is no evidence of active infection at this time which is great news. 07/23/2019 upon evaluation today patient appears to be doing well with regard to his wounds on the plantar foot. He has been tolerating the dressing changes without complication. These do seem to be healing. 08/06/2019 upon evaluation today patient's wound actually appears to be showing some signs of improvement though he still has a significant amount of callus buildup over this location. That is can require some debridement today. With that being said the actual wound openings seem to be dramatically improved which is great news. There is no signs of active infection at this time. No fevers, chills, nausea, vomiting, or diarrhea. 08/13/2019 upon evaluation today patient appears to be doing well  with regard to his foot ulcer. He has been tolerating the dressing changes without complication.  Fortunately there is no signs of active infection at this time. No fevers, chills, nausea, vomiting, or diarrhea. 08/20/2019 upon evaluation today patient appears to be doing excellent in regard to his foot ulcer. I do believe he still showing signs of improvement this is very slow but again I think still pressure getting to the area even with his brace and custom shoe is still part of the main issue with the delay here. Nonetheless I do not see any signs of worsening or infection all of which is good news. 5/20; left lateral foot wound. He wears an AFO brace for foot drop related to his diabetes. The wound is small and clean looking he is using silver collagen. We have apparently tried him in offloading shoe he could not tolerate this. He does not have good balance. 09/11/2019 upon evaluation today patient appears to be doing more poorly in regard to his foot ulcer as compared to last time I saw him. Unfortunately he does tell me as we discussed on Friday that he had pus and blood coming from the area underneath the callus. Unfortunately I think that this is a combination of the callus not being trimmed away enough and potentially the felt pad causing an abnormal rubbing which has happened before which led to this becoming more irritated and likely infected based on what I am seeing today. The patient takes ampicillin all the time. Subsequently he also states that he has been trying for a couple of days to take the clindamycin of which she had some leftover from previous. With that being said it upset his stomach. The antibiotic that I sent in for him on Friday unfortunately I wrote the wrong number on as far as the number of pills that he needed. With that being said I corrected that this morning but he tells me is not can pick it up as that seems to be making him have trouble with his stomach being upset at  this point. Fortunately there is no signs of active infection at this time systemically. 09/17/2019 upon evaluation today patient appears to be doing better in regard to his foot ulcer. Has been taking ampicillin that he had leftover from a previous prescription. With that being said he did go see his doctor today concerning his diabetes and notes that his hemoglobin A1c was 13 obviously this is elevated and there can be working on that. Also did review the culture result that we finally got back for final well that showed rare Pseudomonas and a few group B strep noted. The Pseudomonas was sensitive to Cipro but again the wound is doing so much better with the ampicillin I think this is probably not a causative organism I do not see anything that really has me concerned as far as infection is concerned today. Therefore I would hold off on prescribing Cipro for the patient and have him discontinue the ampicillin until completion. 09/24/2019 upon evaluation today patient appears to be doing 09/24/2019 upon evaluation today patient appears to be doing well with regard to his foot ulcer. He actually has not enlarged any and seems to be measuring smaller quite significantly at this point. His toe ulcer is also doing somewhat better which is good news. Fortunately there is no evidence of active infection at this time. No fevers, chills, nausea, vomiting, or diarrhea. 10/08/2019 upon evaluation today patient appears to be doing well with regard to his foot ulcer all things considered. Fortunately there is no signs of active infection at  this time. No fevers, chills, nausea, vomiting, or diarrhea. Fortunately he states that overall he has been doing much better with regard to not have any blistering on the side of his foot. Unfortunately he did have a fall last night due to the fact that he got up to walk without his brace on and did not have his walker either it was around 4 in the morning and his mother was  calling out. Subsequently he states that the foot drop got him and he ended up following. Nonetheless he states he normally uses his walker I explained he needs to try to make sure not to ever go without the walker. No matter how attempting the situation may be. Readmission: 12/03/2019 upon evaluation today patient presents for follow-up after having had surgery last week on his foot for surgical debridement and then subsequent wound VAC placement. He had gone into the hospital as he was having a significant infection and not feeling well. He is unfortunately since I last saw him also been under the care of the cancer doctors and they have been taking good care of him. He does seem to be in fairly high spirits all things considered. With that being said he has lung cancer he tells me that his back from the radiation feels like it is constantly burned. 12/10/2019 on evaluation today patient appears to be doing well with regard to his wound. I do believe the wound VAC is doing a great job at helping to manage his drainage and overall health and to granulate in the wound. He has less undermining noted today in fact almost none. Home health is coming out to change the wound VAC on a regular basis Monday, Wednesday, and Friday. 12/24/2019 on evaluation today patient appears to be doing fairly well in regard to the wound on the plantar aspect of his foot. With that being said he does have a significant feeling in of what has been going on with regard to the ulceration which does not have the depth that it did in the past. With that being said I do believe that based on what I am seeing at this point the patient likely does need to take a break from the wound VAC and in fact it appears the wound VAC was actually placed directly on the skin as far as the bridging was concerned causing some irritation of the top of the patient's foot. This has me concerned as well that if were not careful that could worsen. I do  believe a break with the wound VAC however will be fine and then we can see where we go from there. [name], BEGLEY ([id number]) 01/07/2020 upon evaluation today patient actually appears to be doing much better in regard to his wound. He has been tolerating the dressing changes without complication. There is no signs of active infection and overall I feel like he is managing quite nicely. I do believe that we would likely discontinue the [facility] altogether today. 10/13; follow-up visit. Patient has a area on the left lateral plantar foot. He has been using Hydrofera Blue and bordered foam he has home health changing this 3 times a week. He is nonambulatory. He avoids pushing the wheelchair with that part of his foot and instead uses his heel. We have had a nice improvement today and overall surface area 10/27; 2-week follow-up. He has been using Hydrofera Blue and bordered foam on a wound on the left lateral foot. He told me he was nonambulatory  last time from foot drop although he walked in the clinic with a walker this week apparently did not have any transportation. Also he has a new wound on the right dorsal medial foot which she says was caused by foot where trauma 11/10; 2-week follow-up. We have been using Hydrofera Blue on the left lateral foot roughly at the base of the fifth metatarsal. He came in last visit with a punched out area on the right dorsal foot. He says this was foot where trauma. I reiterated that he is not wearing his previous braces and he is in. He spends most of his time in his wheelchair. He says he is careful not to be pushing himself around in the wheelchair with his feet. He is wearing regular tennis shoes. I taken a bit of time to review the history of the wound on the left foot. This actually was a chronic wound for him. Episodic infections. He had arterial studies done in August these were normal. ABI in the right of 1.05, TBI at 0.65 with triphasic waveforms on the  left 1.01 with a TBI of 0.78 and again triphasic waveforms. Does not appear to be a arterial issue Electronic Signature(s) Signed: 02/21/2020 12:18:17 PM By: Linton Ham MD Entered By: Linton Ham on 02/20/2020 12:13:20 [name] ([id number]) -------------------------------------------------------------------------------- Physical Exam Details Patient Name: [name] Date of Service: 02/20/2020 11:00 AM Medical Record Number: [id number] Patient Account Number: [id number] Date of Birth/Sex: 07-16-1945 (74 y.o. M) Treating RN: Cornell Barman Primary Care Provider: [name] Other Clinician: Referring Provider: [name] Treating Provider/Extender: Tito Dine in Treatment: 11 Constitutional Sitting or standing Blood Pressure is within target range for patient.. Pulse regular and within target range for patient.[name] Kitchen Respirations regular, non- labored and within target range.. Temperature is normal and within the target range for the patient.[name] Kitchen appears in no distress. Cardiovascular Pedal pulses are palpable. Notes Wound exam; oOn the left he has a small circular wound on the left plantar foot at the level of the base of the fifth metatarsal there is epithelialization here. Surrounding skin somewhat thickened and adherent. I used a #3 curette to remove the circumference and some fibrinous debris on the surface but all in all this does not look too bad. oHOWEVER the area on the medial dorsal foot on the right is a small punched out area. This is a bit unusual. It does not look infected. He does not have an arterial issue.. Came in with very adherent debris on the surface of this with a #3 curette I remove this but there is 0.5 cm of depth here. There is no surrounding erythema Electronic Signature(s) Signed: 02/21/2020 12:18:17 PM By: Linton Ham MD Entered By: Linton Ham on 02/20/2020 12:15:15 [name]  ([id number]) -------------------------------------------------------------------------------- Physician Orders Details Patient Name: [name] Date of Service: 02/20/2020 11:00 AM Medical Record Number: [id number] Patient Account Number: [id number] Date of Birth/Sex: December 04, 1945 (74 y.o. M) Treating RN: Dolan Amen Primary Care Provider: [name] Other Clinician: Referring Provider: [name] Treating Provider/Extender: Tito Dine in Treatment: 11 Verbal / Phone Orders: No Diagnosis Coding Wound Cleansing Wound #7 Left,Lateral,Plantar Foot o Clean wound with Normal Saline. Wound #9 Right,Dorsal Foot o Clean wound with Normal Saline. Primary Wound Dressing Wound #7 Left,Lateral,Plantar Foot o Silver Collagen - moisten with hydrogel Wound #9 Right,Dorsal Foot o Silver Collagen - moisten with hydrogel Secondary Dressing Wound #7 Left,Lateral,Plantar Foot o Boardered Foam Dressing Wound #9 Right,Dorsal Foot o  Boardered Foam Dressing Follow-up Appointments Wound #7 Left,Lateral,Plantar Foot o Return Appointment in 1 week. Wound #9 Right,Dorsal Foot o Return Appointment in 1 week. Edema Control Wound #7 Left,Lateral,Plantar Foot o Elevate legs to the level of the heart and pump ankles as often as possible Wound #9 Right,Dorsal Foot o Elevate legs to the level of the heart and pump ankles as often as possible Off-Loading Wound #7 Left,Lateral,Plantar Foot o Open toe surgical shoe with peg assist. o Other: - No weight on foot, Wheelchair only Wound #9 Right,Dorsal Foot o Open toe surgical shoe o Other: - No weight on foot, Wheelchair only Additional Orders / Instructions Wound #7 Left,Lateral,Plantar Foot o Increase protein intake. Wound #9 Right,Dorsal Foot o Increase protein intake. Home Health Wound #7 [place] [facility] - [address] CON[place], LATA  ([id number]) o Home Health Nurse may visit PRN to address patientos wound care needs. o FACE TO FACE ENCOUNTER: MEDICARE and MEDICAID PATIENTS: I certify that this patient is under my care and that I had a face-to-face encounter that meets the physician face-to-face encounter requirements with this patient on this date. The encounter with the patient was in whole or in part for the following MEDICAL CONDITION: (primary reason for [place]) MEDICAL NECESSITY: I certify, that based on my findings, NURSING services are a medically necessary home health service. HOME BOUND STATUS: I certify that my clinical findings support that this patient is homebound (i.e., Due to illness or injury, pt requires aid of supportive devices such as crutches, cane, wheelchairs, walkers, the use of special transportation or the assistance of another person to leave their place of residence. There is a normal inability to leave the home and doing so requires considerable and taxing effort. Other absences are for medical reasons / religious services and are infrequent or of short duration when for other reasons). o If current dressing causes regression in wound condition, may D/C ordered dressing product/s and apply Normal Saline Moist Dressing daily until next [place] / Other MD appointment. [place] of regression in wound condition at [phone]. o Please direct any NON-WOUND related issues/requests for orders to patient's Primary Care Physician Wound #9 [place] Visits - [place] Nurse may visit PRN to address patientos wound care needs. o FACE TO FACE ENCOUNTER: MEDICARE and MEDICAID PATIENTS: I certify that this patient is under my care and that I had a face-to-face encounter that meets the physician face-to-face encounter requirements with this patient on this date. The encounter with the patient was in whole or in  part for the following MEDICAL CONDITION: (primary reason for [place]) MEDICAL NECESSITY: I certify, that based on my findings, NURSING services are a medically necessary home health service. HOME BOUND STATUS: I certify that my clinical findings support that this patient is homebound (i.e., Due to illness or injury, pt requires aid of supportive devices such as crutches, cane, wheelchairs, walkers, the use of special transportation or the assistance of another person to leave their place of residence. There is a normal inability to leave the home and doing so requires considerable and taxing effort. Other absences are for medical reasons / religious services and are infrequent or of short duration when for other reasons). o If current dressing causes regression in wound condition, may D/C ordered dressing product/s and apply Normal Saline Moist Dressing daily until next [place] / Other MD appointment. [place]  of regression in wound condition at [phone]. o Please direct any NON-WOUND related issues/requests for orders to patient's Primary Care Physician Electronic Signature(s) Signed: 02/20/2020 4:38:43 PM By: Charlett Nose Signed: 02/21/2020 12:18:17 PM By: Linton Ham MD Entered By: Georges Mouse, Minus Breeding on 02/20/2020 13:34:33 [name] ([id number]) -------------------------------------------------------------------------------- Problem List Details Patient Name: [name] Date of Service: 02/20/2020 11:00 AM Medical Record Number: [id number] Patient Account Number: [id number] Date of Birth/Sex: Nov 25, 1945 (74 y.o. 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 12/03/2019 No Yes muscle L97.512 Non-pressure chronic ulcer of other part of right foot with fat layer 02/06/2020 No Yes exposed E11.42 Type 2 diabetes mellitus with diabetic polyneuropathy 12/03/2019 No Yes M21.372 Foot drop, left foot 12/03/2019 No Yes Inactive Problems Resolved Problems Electronic Signature(s) Signed: 02/21/2020 12:18:17 PM By: Linton Ham MD Entered By: Linton Ham on 02/20/2020 11:57:24 [name] ([id number]) -------------------------------------------------------------------------------- Progress Note Details Patient Name: [name] Date of Service: 02/20/2020 11:00 AM Medical Record Number: [id number] Patient Account Number: [id number] Date of Birth/Sex: 10-24-1945 (74 y.o. M) Treating RN: Cornell Barman Primary Care Provider: [name] Other Clinician: Referring Provider: [name] Treating Provider/Extender: Tito Dine in Treatment: 11 Subjective History of Present Illness (HPI) 10/04/16 on evaluation today patient presents for initial evaluation concerning a laceration of the right wrist and hand on the bowler aspect. He tells me that he fell into glass following a medication change in regard to his diabetes. He initially went to [facility] where she was sutured and subsequently his daughter who is a Marine scientist removed the teachers at the appropriate time. Unfortunately the wound has done poorly and dehissed which has caused him some trouble including infection. He has been on antibiotics both topically and orally though the wound continues to show signs of necrotic tissue according to notes which is what he was sent to [place] for evaluation concerning. He does have diabetes and unfortunately this is severely uncontrolled. He does see an endocrinologist and is working on this. 10/11/2016 -- the patient's  notes from the ER were reviewed and he had his suturing done on 09/17/2016 and had necrotic skin flaps which had to be trimmed the last visit he was here. He has managed to get his Santyl ointment and has been using this appropriately. 10/21/16 patient's wound appears to be doing very well on evaluation today. He still has some [place] covering the wound bed but this is nowhere near as severe as when i saw this initially two weeks ago nor even my review of his pictures last week. I'm pleased with how this is progressing. Patient's wound over the right form appears to be doing fairly well on evaluation today. There is no evidence of infection and he has a small area at the crease of where she is wrist extends and flexes that is still open and appears to be having difficulty due to the fact that it is cracking open. I think this is something that may be controlled with moisture control that is keeping the wound a little bit more moist. Nonetheless this is a tiny region compared to the initial wound and overall appears to be doing very well. No  fevers, chills, nausea, or vomiting noted at this time. 11/25/16 on evaluation today patient appears to be doing well in regard to his right wrist ulcer. In fact this appears to be completely healed he is having no discomfort. He is pleased with how this is progressed. Readmission: 06/24/17 on evaluation today patient appears back in our clinic regarding issues that he has been having with his right ankle on the lateral malleolus as well is in the dorsal surface of his left foot. Both have been going on for several weeks unfortunately. He tells me that initially this was being managed by Dr. Edilia Bo and I did review the notes from Dr. Edilia Bo in epic. Patient was placed on amoxicillin initially and then subsequently Keflex following. He has been using triple antibiotic ointment over-the-counter along with a Band-Aid for the wound currently. He did not have any  Santyl remaining. His most recent hemoglobin A1c which was just two days ago was 10.3. Fortunately he seems to have excellent blood flow in the bilateral lower extremities with his left ABI being 0.95 in the right ABI 1.01. Patient does have some pain in regard to each of these ulcerations but he tells me it's not nearly as severe as the hand wound that I help treat earlier over the summer. This was in 2018. 07/15/17 on evaluation today patient's lower surety ulcer is actually both appear to be doing fairly well at this point. He does have a little bit of skin overlapping the ulcer on the right lateral ankle this was debrided away today just with saline and gauze without complication patient had no discomfort or pain. Otherwise patient's left foot ulcer does appear to be showing signs of improvement he does seem to have a right great toe. Nikki and noted at this point in time. I think that this does need to be managed at this point. No fevers, chills, nausea, or vomiting noted at this time. 07/22/17 on evaluation today patient's wounds in regard to his lower extremities appear to be doing somewhat better. His right ankle wound in fact appears to likely be completely closed. The left dorsal foot ulcer though still open does appear to be a little bit smaller and does also seem to be making good progress. He has not been having problems with the treatment at this point in the general he does not seem to show as much erythema surrounding the wound bed which is good news. He is not having significant discomfort. 08/12/17 on evaluation today patients wounds of both appeared to be doing better in fact they both appear to be completely healed. Overall I'm very pleased with how things have progressed over the past week even more than what I expected. Obviously this is good news and he is happy about this. He did have a fall when going to his orthopedic doctor recently fortunately he does not appear to have injured  anything severely but nonetheless does have a couple of bumps and bruises nothing significant at this time but he needs to be seen and wound care for. He may have broken his left wrist he is following up with orthopedics in that regard. Readmission: 10/24/17 on evaluation today patient presents for reevaluation after having been discharged Aug 12, 2017. His wound was healed at that point although he states that it has been somewhat discolored since that time. He has not experienced the drainage as far as the wound is concerned but the fact that it was still discolored been greater than two  months after the fact made him want to come in for reevaluation. Upon inspection today the patient has no pain although he does have neuropathy. He does have a discolored region of the dorsal surface of the left foot which was the site where the ulcer was that we previously treated. The good news is he does not again have any discomfort although obviously I do believe this may be some scar tissue and just discoloration in that regard. Again he hasn't had any drainage or discharge since I last saw him and at this point I see no evidence of that either this area shows complete epithelialization. In general I do not feel like there's any significant issue at this point in that regard. 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 He has been in this clinic before in 2018 with a laceration on his right hand and then again in 2019 with an area over the right lateral malleolus. 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 He states that the wound is been there for the last week or JODECI, ROARTY. ([id number]) 2. It is been uncomfortable to walk on. The man is an active man having to look after an elderly mother. Past medical history, type 2 diabetes with  polyneuropathy with a recent hemoglobin A1c in September 2011 0.3, lumbar disc disease, hypergonadism., Left foot drop and gout. We did not repeat his ABIs in the clinic quoting values from July 2019 of 0.95 and 1.01. He is not felt to have significant PAD 02/19/2019 on evaluation today patient actually appears to be doing better with regard to his foot wound based on what I am seeing currently. He was seen by Dr. Dellia Nims on readmission back into the clinic last week when I was off. The good news is the patient has been tolerating the dressing changes without any complication in fact has not really had much in the way of drainage. He does have an AFO brace which she believes is actually pushing on this area as well and has contributed to the issue. Fortunately there is no signs of active infection at this point. His ABIs appear to be well. 03/05/2019 on evaluation today patient appears to be doing well in regard to his foot ulcer. He has been tolerating the dressing changes without complication. Fortunately there is no signs of active infection. The wound is measuring somewhat smaller and overall I feel like he is making good progress at this point. 03/12/2019 on evaluation today patient actually appears to be doing quite well with regard to his foot ulcer. The wound is looking better the measurement is not significantly better at this point unfortunately but nonetheless again the overall appearance is improving I think he is making some progress. I still think the big issue here is foot drop and again the patient wants to see if potentially he could get a brace to help with this. Subsequently I think that we can definitely make a referral [facility] to see if they can help [place] in this regard. He is in agreement with that plan. 03/26/2019 on evaluation today patient appears to be doing more poorly in regard to his foot ulcer at this time. He did see podiatry in order to see about getting a brace but  unfortunately he tells me that Medicare is not likely to pay for  one for him as he had 1 I guess 8 months ago. However this one that apparently goes in his shoes that does not seem to be working out well for him. Unfortunately there are signs of active infection at this time. No fevers, chills, nausea, vomiting, or diarrhea. Systemically. The patient has erythema on the dorsal surface of his foot that tracks from the lateral portion of his foot and again he does have a blister around the wound as well which is worse compared to previous. Overall I am concerned. 04/02/2019 on evaluation today patient appears to be doing about the same with regard to his wound. Unfortunately there is no signs of significant improvement although I do think there is no signs of infection which is good news. His drainage is also slowed down tremendously. I think at this point he would benefit from the initiation of a total contact cast. The only concern I have is that over his balance. I explained to the patient that if we were to initiate the cast I think it would be greatly beneficial for him but he would need to ensure not to walk at all unless he uses a walker he actually has 2 walkers one for inside his home and one that he keeps out in the car. For that reason he would be covered in that regard. I just do not want him to have any accidents and fall with the cast. 12/28-Patient comes in 1 week out a day early for his appointment he was in the hospital for 2 days for uncontrolled hyperglycemia and required management of high blood sugars, with some adjustments in his home regimen of insulin, patient stubbed his right great toe today and put a Band-Aid on it, he is here for his left lateral foot ulcer and we are using silver alginate dressing, he was intact total contact cast with difficulty with balance to the extent that that had to be discontinued 04/16/2019 on evaluation today patient appears to be doing better with  regard to the overall appearance of his wound size. This is good news. We had attempted a total contact cast but unfortunately he had some falling issues that the family contacted [place] about we took the cast off we did not put it back on. Nonetheless it ended up that just a couple days later he was in the hospital due to elevations in his blood sugar which went to around 500 and even a little higher. Subsequently he was admitted to the hospital and was in the hospital for a total of 3 days and that included Christmas day. Fortunately there got his blood sugar under control he tells me that this is running about 200 now which is much better news. There does not appear to be any signs of active infection at this time. No fevers, chills, nausea, vomiting, or diarrhea. 04/30/2019 on evaluation today patient actually appears to be doing slightly worse compared to previous as far as the overall size of his wound is concerned. There also is some need for sharp debridement today as well. Fortunately there is no evidence of active infection which is good news. No fevers, chills, nausea, vomiting, or diarrhea. 05/07/2019 upon evaluation today patient appears to be doing excellent in regard to his plantar foot ulcer. He has been tolerating the dressing changes without complication. Fortunately there is no signs of active infection at this time. No fevers, chills, nausea, vomiting, or diarrhea. He does tell me that he actually got in touch with  [employer] and they feel like they can add diagnosis codes to his orders in order to get an external brace for him as opposed to the one that goes internal to issue. That would be excellent for his foot drop and I did tell him that if there is anything I need to fill out in that regard I will be more than happy to oblige and fill out what is needed for [employer]. 05/14/2019 upon evaluation today patient's original wound site actually appears to be doing much better which is good news.  Fortunately there is no signs of active infection at this time. No fevers, chills, nausea, vomiting, or diarrhea. With that being said the patient unfortunately is continuing to have issues with rubbing he tells me that the felt slid on his foot although he did not realize it until today. Nonetheless I think this did cause a blister around the edge of the wound and he does an open wound that is slightly larger overall in measurement compared to last time mainly due to this blister. Other than that I feel like he is doing quite well. 05/21/2019 upon evaluation today patient appears to be doing excellent in regard to his wound at this time on the plantar foot. He is showing signs of improvement he does have some slough and biofilm noted on the surface of the wound this is going require some sharp debridement today as well. Fortunately there is no evidence of active infection. No fevers, chills, nausea, vomiting, or diarrhea. 05/28/2019 on evaluation today patient's wound actually appears to be measuring smaller upon initial inspection and I do believe that it is doing well. With that being said he continues to have a blister on the outside/lateral edge of the wound that occurs as a result of rubbing due to his dropfoot currently. Subsequently we have filled out the paperwork for his new external brace but again that does take several weeks to get that completed once everything is returned to back we did send this back last week as far as the paperwork was concerned. With that being said there does not appear to be any signs of active infection at this time. 06/04/2019 on evaluation today patient's wound actually appears to be doing slightly better compared to last week which is good news. He still states that his insurance unfortunately does not seem to want to cover any of his new brace. He is going to just pay for this himself. Nonetheless he is going by there today when he leaves here that is [employer] in  [place] that is going to be making this brace for him. 06/18/2019 upon evaluation today patient fortunately does have his new brace which is the external AFO brace with bilateral uprights. Subsequently he seems to be doing much better in fact the wound already appears better. He has not even been using the felt he is just using a dressing over [place] ([id number]) top of the wound and overall I am very pleased with how things seem to be progressing. There is no signs of active infection at this time which is great news and I think that the AFO is good to help him tremendously as far as trying to keep pressure off of the 06/25/2019 upon evaluation today patient seems to be making good progress in regard to his wound. He has been tolerating the dressing changes without complication. Fortunately there is no signs of active infection at this time. No fevers, chills, nausea, vomiting, or diarrhea. 07/02/2019 upon  evaluation today patient unfortunately had a blistered area on the bottom of his foot tracking more towards the midline of the plantar aspect. He does not seem to have issues with rubbing more laterally that he used to have but nonetheless this is still giving him some trouble and causing problems here. Fortunately there is no signs of active infection at this time. No fevers, chills, nausea, vomiting, or diarrhea. 07/09/2019 upon evaluation today patient appears to be doing well with regard to his plantar foot ulcer. Definitely this is much improved compared to last week's visit which again last week the issue there was that he had an area of callus buildup on the plantar aspect of his foot I was able to clean this away and after effectively doing so the wound seems to be doing much better which is great news. There is no signs of active infection at this time. 07/16/2019 upon evaluation today patient appears to be making some progress in my opinion with regard to his plantar foot ulcer. He  has been tolerating the dressing changes without complication. Fortunately there is no evidence of active infection at this time which is great news. 07/23/2019 upon evaluation today patient appears to be doing well with regard to his wounds on the plantar foot. He has been tolerating the dressing changes without complication. These do seem to be healing. 08/06/2019 upon evaluation today patient's wound actually appears to be showing some signs of improvement though he still has a significant amount of callus buildup over this location. That is can require some debridement today. With that being said the actual wound openings seem to be dramatically improved which is great news. There is no signs of active infection at this time. No fevers, chills, nausea, vomiting, or diarrhea. 08/13/2019 upon evaluation today patient appears to be doing well with regard to his foot ulcer. He has been tolerating the dressing changes without complication. Fortunately there is no signs of active infection at this time. No fevers, chills, nausea, vomiting, or diarrhea. 08/20/2019 upon evaluation today patient appears to be doing excellent in regard to his foot ulcer. I do believe he still showing signs of improvement this is very slow but again I think still pressure getting to the area even with his brace and custom shoe is still part of the main issue with the delay here. Nonetheless I do not see any signs of worsening or infection all of which is good news. 5/20; left lateral foot wound. He wears an AFO brace for foot drop related to his diabetes. The wound is small and clean looking he is using silver collagen. We have apparently tried him in offloading shoe he could not tolerate this. He does not have good balance. 09/11/2019 upon evaluation today patient appears to be doing more poorly in regard to his foot ulcer as compared to last time I saw him. Unfortunately he does tell me as we discussed on Friday that he had pus  and blood coming from the area underneath the callus. Unfortunately I think that this is a combination of the callus not being trimmed away enough and potentially the felt pad causing an abnormal rubbing which has happened before which led to this becoming more irritated and likely infected based on what I am seeing today. The patient takes ampicillin all the time. Subsequently he also states that he has been trying for a couple of days to take the clindamycin of which she had some leftover from previous. With that being said  it upset his stomach. The antibiotic that I sent in for him on Friday unfortunately I wrote the wrong number on as far as the number of pills that he needed. With that being said I corrected that this morning but he tells me is not can pick it up as that seems to be making him have trouble with his stomach being upset at this point. Fortunately there is no signs of active infection at this time systemically. 09/17/2019 upon evaluation today patient appears to be doing better in regard to his foot ulcer. Has been taking ampicillin that he had leftover from a previous prescription. With that being said he did go see his doctor today concerning his diabetes and notes that his hemoglobin A1c was 13 obviously this is elevated and there can be working on that. Also did review the culture result that we finally got back for final well that showed rare Pseudomonas and a few group B strep noted. The Pseudomonas was sensitive to Cipro but again the wound is doing so much better with the ampicillin I think this is probably not a causative organism I do not see anything that really has me concerned as far as infection is concerned today. Therefore I would hold off on prescribing Cipro for the patient and have him discontinue the ampicillin until completion. 09/24/2019 upon evaluation today patient appears to be doing 09/24/2019 upon evaluation today patient appears to be doing well with regard  to his foot ulcer. He actually has not enlarged any and seems to be measuring smaller quite significantly at this point. His toe ulcer is also doing somewhat better which is good news. Fortunately there is no evidence of active infection at this time. No fevers, chills, nausea, vomiting, or diarrhea. 10/08/2019 upon evaluation today patient appears to be doing well with regard to his foot ulcer all things considered. Fortunately there is no signs of active infection at this time. No fevers, chills, nausea, vomiting, or diarrhea. Fortunately he states that overall he has been doing much better with regard to not have any blistering on the side of his foot. Unfortunately he did have a fall last night due to the fact that he got up to walk without his brace on and did not have his walker either it was around 4 in the morning and his mother was calling out. Subsequently he states that the foot drop got him and he ended up following. Nonetheless he states he normally uses his walker I explained he needs to try to make sure not to ever go without the walker. No matter how attempting the situation may be. Readmission: 12/03/2019 upon evaluation today patient presents for follow-up after having had surgery last week on his foot for surgical debridement and then subsequent wound VAC placement. He had gone into the hospital as he was having a significant infection and not feeling well. He is unfortunately since I last saw him also been under the care of the cancer doctors and they have been taking good care of him. He does seem to be in fairly high spirits all things considered. With that being said he has lung cancer he tells me that his back from the radiation feels like it is constantly burned. 12/10/2019 on evaluation today patient appears to be doing well with regard to his wound. I do believe the wound VAC is doing a great job at helping to manage his drainage and overall health and to granulate in the  wound. He has less  undermining noted today in fact almost none. Home health is coming out to change the wound VAC on a regular basis Monday, Wednesday, and Friday. 12/24/2019 on evaluation today patient appears to be doing fairly well in regard to the wound on the plantar aspect of his foot. With that being said he does have a significant feeling in of what has been going on with regard to the ulceration which does not have the depth that it did in the past. With that being said I do believe that based on what I am seeing at this point the patient likely does need to take a break from the wound VAC and in fact it appears the wound VAC was actually placed directly on the skin as far as the bridging was concerned causing some irritation of the top of the patient's foot. This has me concerned as well that if were not careful that could worsen. I do believe a break with the wound VAC however will be fine and then we can see where we go from there. ARRIAN, MANSON ([id number]) 01/07/2020 upon evaluation today patient actually appears to be doing much better in regard to his wound. He has been tolerating the dressing changes without complication. There is no signs of active infection and overall I feel like he is managing quite nicely. I do believe that we would likely discontinue the [facility] altogether today. 10/13; follow-up visit. Patient has a area on the left lateral plantar foot. He has been using Hydrofera Blue and bordered foam he has home health changing this 3 times a week. He is nonambulatory. He avoids pushing the wheelchair with that part of his foot and instead uses his heel. We have had a nice improvement today and overall surface area 10/27; 2-week follow-up. He has been using Hydrofera Blue and bordered foam on a wound on the left lateral foot. He told me he was nonambulatory last time from foot drop although he walked in the clinic with a walker this week apparently did not have any  transportation. Also he has a new wound on the right dorsal medial foot which she says was caused by foot where trauma 11/10; 2-week follow-up. We have been using Hydrofera Blue on the left lateral foot roughly at the base of the fifth metatarsal. He came in last visit with a punched out area on the right dorsal foot. He says this was foot where trauma. I reiterated that he is not wearing his previous braces and he is in. He spends most of his time in his wheelchair. He says he is careful not to be pushing himself around in the wheelchair with his feet. He is wearing regular tennis shoes. I taken a bit of time to review the history of the wound on the left foot. This actually was a chronic wound for him. Episodic infections. He had arterial studies done in August these were normal. ABI in the right of 1.05, TBI at 0.65 with triphasic waveforms on the left 1.01 with a TBI of 0.78 and again triphasic waveforms. Does not appear to be a arterial issue Objective Constitutional Sitting or standing Blood Pressure is within target range for patient.. Pulse regular and within target range for patient.[name] Kitchen Respirations regular, non- labored and within target range.. Temperature is normal and within the target range for the patient.[name] Kitchen appears in no distress. Vitals Time Taken: 11:19 AM, Height: 71 in, Weight: 190 lbs, BMI: 26.5, Temperature: 98.1 F, Pulse: 132 bpm, Respiratory Rate: 18 breaths/min, Blood  Pressure: 112/69 mmHg. Cardiovascular Pedal pulses are palpable. General Notes: Wound exam; On the left he has a small circular wound on the left plantar foot at the level of the base of the fifth metatarsal there is epithelialization here. Surrounding skin somewhat thickened and adherent. I used a #3 curette to remove the circumference and some fibrinous debris on the surface but all in all this does not look too bad. HOWEVER the area on the medial dorsal foot on the right is a small punched out area. This  is a bit unusual. It does not look infected. He does not have an arterial issue.. Came in with very adherent debris on the surface of this with a #3 curette I remove this but there is 0.5 cm of depth here. There is no surrounding erythema Integumentary (Hair, Skin) Wound #7 status is Open. Original cause of wound was Surgical Injury. The wound is located on the [place]. The wound measures 1.1cm length x 1.1cm width x 0.1cm depth; 0.95cm^2 area and 0.095cm^3 volume. There is Fat Layer (Subcutaneous Tissue) exposed. There is no tunneling or undermining noted. There is a medium amount of serosanguineous drainage noted. The wound margin is thickened. There is small (1-33%) red, pink, hyper - granulation within the wound bed. There is a small (1-33%) amount of necrotic tissue within the wound bed including Adherent Slough. Wound #9 status is Open. Original cause of wound was Pressure Injury. The wound is located on the Right,Dorsal Foot. The wound measures 1cm length x 0.6cm width x 0.3cm depth; 0.471cm^2 area and 0.141cm^3 volume. There is Fat Layer (Subcutaneous Tissue) exposed. There is no tunneling or undermining noted. There is a small amount of serosanguineous drainage noted. The wound margin is flat and intact. There is small (1-33%) pink granulation within the wound bed. There is a large (67-100%) amount of necrotic tissue within the wound bed including Adherent Slough. 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 ([id number]) Procedures Wound #7 Pre-procedure diagnosis of Wound #7 is a Diabetic Wound/Ulcer of the Lower Extremity located on the Left,Lateral,Plantar Foot .Severity of Tissue Pre  Debridement is: Fat layer exposed. There was a Excisional Skin/Subcutaneous Tissue Debridement with a total area of 1.21 sq cm performed by Ricard Dillon, MD. With the following instrument(s): Curette to remove Non-Viable tissue/material. Material removed includes Callus and Subcutaneous Tissue and after achieving pain control using Lidocaine 4% Topical Solution. A time out was conducted at 11:44, prior to the start of the procedure. A Minimum amount of bleeding was controlled with Pressure. The procedure was tolerated well with a pain level of 0 throughout and a pain level of 0 following the procedure. Post Debridement Measurements: 1.1cm length x 1.1cm width x 0.2cm depth; 0.19cm^3 volume. Character of Wound/Ulcer Post Debridement is stable. Severity of Tissue Post Debridement is: Limited to breakdown of skin. Post procedure Diagnosis Wound #7: Same as Pre-Procedure Wound #9 Pre-procedure diagnosis of Wound #9 is a Diabetic Wound/Ulcer of the Lower Extremity located on the Right,Dorsal Foot .Severity of Tissue Pre Debridement is: Fat layer exposed. There was a Excisional Skin/Subcutaneous Tissue Debridement with a total area of 0.6 sq cm performed by Ricard Dillon, MD. With the following instrument(s): Curette to remove Viable and Non-Viable tissue/material.  Material removed includes Subcutaneous Tissue and Slough and after achieving pain control using Lidocaine 4% Topical Solution. A time out was conducted at 11:43, prior to the start of the procedure. A Minimum amount of bleeding was controlled with Pressure. The procedure was tolerated well with a pain level of 0 throughout and a pain level of 0 following the procedure. Post Debridement Measurements: 1cm length x 0.6cm width x 0.5cm depth; 0.236cm^3 volume. Character of Wound/Ulcer Post Debridement is stable. Severity of Tissue Post Debridement is: Fat layer exposed. Post procedure Diagnosis Wound #9: Same as Pre-Procedure Plan Wound  Cleansing: Wound #7 Left,Lateral,Plantar Foot: Clean wound with Normal Saline. Wound #9 Right,Dorsal Foot: Clean wound with Normal Saline. Anesthetic (add to Medication List): Wound #7 Left,Lateral,Plantar Foot: Topical Lidocaine 4% cream applied to wound bed prior to debridement (In Clinic Only). Wound #9 Right,Dorsal Foot: Topical Lidocaine 4% cream applied to wound bed prior to debridement (In Clinic Only). Primary Wound Dressing: Wound #7 Left,Lateral,Plantar Foot: Silver Collagen - moisten with hydrogel Wound #9 Right,Dorsal Foot: Silver Collagen - moisten with hydrogel Secondary Dressing: Wound #7 Left,Lateral,Plantar Foot: Boardered Foam Dressing Wound #9 Right,Dorsal Foot: Boardered Foam Dressing Dressing Change Frequency: Wound #7 Left,Lateral,Plantar Foot: Change Dressing Monday, Wednesday, Friday Wound #9 Right,Dorsal Foot: Change Dressing Monday, Wednesday, Friday Follow-up Appointments: Wound #7 Left,Lateral,Plantar Foot: Return Appointment in 2 weeks. Wound #9 Right,Dorsal Foot: Return Appointment in 1 week. Edema Control: Wound #7 Left,Lateral,Plantar Foot: Elevate legs to the level of the heart and pump ankles as often as possible Wound #9 Right,Dorsal Foot: Elevate legs to the level of the heart and pump ankles as often as possible Off-Loading: Wound #7 Left,Lateral,Plantar Foot: Open toe surgical shoe with peg assist. Other: - No weight on foot, Wheelchair only Wound #9 Right,Dorsal Foot: Open toe surgical shoe Other: - No weight on foot, Wheelchair only Additional Orders / Instructions: Wound #7 Left,Lateral,Plantar Foot: RODY, KEADLE E. ([id number]) Increase protein intake. Wound #9 Right,Dorsal Foot: Increase protein intake. Home Health: Wound #7 Left,Lateral,Plantar Foot: Continue Home Health Visits - [place] Nurse may visit PRN to address patient s wound care needs. FACE TO FACE ENCOUNTER: MEDICARE and MEDICAID PATIENTS: I  certify that this patient is under my care and that I had a face-to-face encounter that meets the physician face-to-face encounter requirements with this patient on this date. The encounter with the patient was in whole or in part for the following MEDICAL CONDITION: (primary reason for [place]) MEDICAL NECESSITY: I certify, that based on my findings, NURSING services are a medically necessary home health service. HOME BOUND STATUS: I certify that my clinical findings support that this patient is homebound (i.e., Due to illness or injury, pt requires aid of supportive devices such as crutches, cane, wheelchairs, walkers, the use of special transportation or the assistance of another person to leave their place of residence. There is a normal inability to leave the home and doing so requires considerable and taxing effort. Other absences are for medical reasons / religious services and are infrequent or of short duration when for other reasons). If current dressing causes regression in wound condition, may D/C ordered dressing product/s and apply Normal Saline Moist Dressing daily until next [place] / Other MD appointment. [place] of regression in wound condition at [phone]. Please direct any NON-WOUND related issues/requests for orders to patient's Primary Care Physician Wound #9 Right,Dorsal Foot: [place] Visits - [place] Nurse may visit PRN to address patient  s wound care needs. FACE TO FACE ENCOUNTER: MEDICARE and MEDICAID PATIENTS: I certify that this patient is under my care and that I had a face-to-face encounter that meets the physician face-to-face encounter requirements with this patient on this date. The encounter with the patient was in whole or in part for the following MEDICAL CONDITION: (primary reason for [place]) MEDICAL NECESSITY: I certify, that based on my findings, NURSING services are a medically  necessary home health service. HOME BOUND STATUS: I certify that my clinical findings support that this patient is homebound (i.e., Due to illness or injury, pt requires aid of supportive devices such as crutches, cane, wheelchairs, walkers, the use of special transportation or the assistance of another person to leave their place of residence. There is a normal inability to leave the home and doing so requires considerable and taxing effort. Other absences are for medical reasons / religious services and are infrequent or of short duration when for other reasons). If current dressing causes regression in wound condition, may D/C ordered dressing product/s and apply Normal Saline Moist Dressing daily until next [place] / Other MD appointment. [place] of regression in wound condition at [phone]. Please direct any NON-WOUND related issues/requests for orders to patient's Primary Care Physician 1. I changed the dressings on both wounds to Hydrofera Blue 2. I put him in bilateral Pegasys shoes to see if we can offload the areas better 3. He does not have an arterial issue. The wounds did not appear to be infected. 4. He is not wearing his previous AFO braces there is nothing rubbing on these areas that I could tell other than his running shoes. That is the reason I put him in surgical shoes Number five 2-week follow-up Electronic Signature(s) Signed: 02/21/2020 12:18:17 PM By: Linton Ham MD Entered By: Linton Ham on 02/20/2020 12:16:28 [name] ([id number]) -------------------------------------------------------------------------------- [place] Details Patient Name: [name] Date of Service: 02/20/2020 Medical Record Number: [id number] Patient Account Number: [id number] Date of Birth/Sex: 1946/03/03 (74 y.o. 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 M21.372 Foot drop, left foot Facility Procedures CPT4 Code: 86578469 Description: 62952 - DEB SUBQ TISSUE 20 SQ CM/< Modifier: Quantity: 1 CPT4 Code: Description: ICD-10 Diagnosis Description L97.523 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 m L97.512 Non-pressure chronic ulcer of other part of right foot with fat layer ex Modifier: uscle posed Quantity: Physician Procedures CPT4 Code: 8413244 Description: 11042 - WC PHYS SUBQ TISS 20 SQ CM Modifier: Quantity: 1 CPT4 Code: Description: ICD-10 Diagnosis Description L97.523 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 m L97.512 Non-pressure chronic ulcer of other part of right foot with fat layer ex Modifier: uscle posed Quantity: Electronic Signature(s) Signed: 02/21/2020 12:18:17 PM By: Linton Ham MD Entered By: Linton Ham on 02/20/2020 12:16:50

## 2020-02-22 ENCOUNTER — Other Ambulatory Visit: Payer: Self-pay | Admitting: Physician Assistant

## 2020-02-22 ENCOUNTER — Telehealth (INDEPENDENT_AMBULATORY_CARE_PROVIDER_SITE_OTHER): Payer: Medicare Other | Admitting: Family Medicine

## 2020-02-22 DIAGNOSIS — Z87891 Personal history of nicotine dependence: Secondary | ICD-10-CM | POA: Diagnosis not present

## 2020-02-22 DIAGNOSIS — E041 Nontoxic single thyroid nodule: Secondary | ICD-10-CM | POA: Diagnosis not present

## 2020-02-22 DIAGNOSIS — J189 Pneumonia, unspecified organism: Secondary | ICD-10-CM

## 2020-02-22 DIAGNOSIS — E1142 Type 2 diabetes mellitus with diabetic polyneuropathy: Secondary | ICD-10-CM

## 2020-02-22 DIAGNOSIS — IMO0002 Reserved for concepts with insufficient information to code with codable children: Secondary | ICD-10-CM

## 2020-02-22 DIAGNOSIS — E1122 Type 2 diabetes mellitus with diabetic chronic kidney disease: Secondary | ICD-10-CM | POA: Diagnosis not present

## 2020-02-22 DIAGNOSIS — J45909 Unspecified asthma, uncomplicated: Secondary | ICD-10-CM | POA: Diagnosis not present

## 2020-02-22 DIAGNOSIS — Z9181 History of falling: Secondary | ICD-10-CM | POA: Diagnosis not present

## 2020-02-22 DIAGNOSIS — E1165 Type 2 diabetes mellitus with hyperglycemia: Secondary | ICD-10-CM | POA: Diagnosis not present

## 2020-02-22 DIAGNOSIS — E11621 Type 2 diabetes mellitus with foot ulcer: Secondary | ICD-10-CM | POA: Diagnosis not present

## 2020-02-22 DIAGNOSIS — L97429 Non-pressure chronic ulcer of left heel and midfoot with unspecified severity: Secondary | ICD-10-CM | POA: Diagnosis not present

## 2020-02-22 DIAGNOSIS — I129 Hypertensive chronic kidney disease with stage 1 through stage 4 chronic kidney disease, or unspecified chronic kidney disease: Secondary | ICD-10-CM | POA: Diagnosis not present

## 2020-02-22 DIAGNOSIS — C3491 Malignant neoplasm of unspecified part of right bronchus or lung: Secondary | ICD-10-CM | POA: Diagnosis not present

## 2020-02-22 DIAGNOSIS — G4733 Obstructive sleep apnea (adult) (pediatric): Secondary | ICD-10-CM | POA: Diagnosis not present

## 2020-02-22 DIAGNOSIS — R000 Tachycardia, unspecified: Secondary | ICD-10-CM | POA: Diagnosis not present

## 2020-02-22 DIAGNOSIS — C3490 Malignant neoplasm of unspecified part of unspecified bronchus or lung: Secondary | ICD-10-CM

## 2020-02-22 DIAGNOSIS — S2232XD Fracture of one rib, left side, subsequent encounter for fracture with routine healing: Secondary | ICD-10-CM | POA: Diagnosis not present

## 2020-02-22 DIAGNOSIS — Z794 Long term (current) use of insulin: Secondary | ICD-10-CM | POA: Diagnosis not present

## 2020-02-22 DIAGNOSIS — N1831 Chronic kidney disease, stage 3a: Secondary | ICD-10-CM | POA: Diagnosis not present

## 2020-02-22 MED ORDER — LEVOFLOXACIN 500 MG PO TABS: 500.0000 mg | ORAL_TABLET | Freq: Every day | ORAL | 0 refills | Status: DC

## 2020-02-22 MED ORDER — PREDNISONE 10 MG PO TABS: ORAL_TABLET | ORAL | 0 refills | Status: DC

## 2020-02-22 NOTE — Progress Notes (Signed)
VIRTUAL VISIT Due to national recommendations of social distancing due to [place] 19, a virtual visit is felt to be most appropriate for this patient at this time.   I connected with the patient on 02/22/20 at 10:40 AM EST by virtual telehealth platform and verified that I am speaking with the correct person using two identifiers.   I discussed the limitations, risks, security and privacy concerns of performing an evaluation and management service by  virtual telehealth platform and the availability of in person appointments. I also discussed with the patient that there may be a patient responsible charge related to this service. The patient expressed understanding and agreed to proceed.  Patient location: Home Provider Location: [place] [facility] Participants: Eliezer Lofts and [name]   Chief Complaint  Patient presents with  . Cough    productive yellow/brown... x 2 weeks... also runny nose... denies chills or body aches.[name] KitchenMarland KitchenOncology sent in Augmentin 02/15/2020--GI Upset but finished course... also using Albuterol ihaler 2-3 times daily... pt has tried OTC Mucinex... does not feel any better     History of Present Illness:  74 year old male patient of Dr. Lillie Fragmin with history of diabetes, former smoking history, COPD/emphesema and non small cell lung cancer ( currently immunocompromised on chemo) presents with 2 weeks of  Productive cough and congestion.   He reports that productive cough is new in last 2 weeks.. yellow brown mucus.  Seen by [facility] on 02/15/2020.. noted to have upper respiratory tract infection and wheeze.  Placed on Augmentin and albuterol prn.   Chest CT on 02/07/20 Showed 1. Mild response to therapy of central superior segment right lower lobe (perihilar) lung mass. 2. Development of right greater than left peribronchovascular ground-glass and nodular airspace disease. Not a typical appearance for radiation change. Favored to represent infection. Correlate with  symptoms. 3. Similar to minimal improvement in thoracic adenopathy.   He reports that he is some better.. still coughing up phlegm, but slightly better.  No fever. Shortness or breath and wheeze.. slightly better. Albuterol using 2-3 times a day.. clear lungs when   Using mucinex.     Chest pain right side.. intermittent.   Has not been tested for COVID.  S/P 3 COVID vaccines.   no myalgia, no ST, no loss of taste or smell.  Stopped Augmentin given it was causing severe diarrhea.   Next OV on 11/22 on oncologist.   Last GFR>60  COVID 19 screen No recent travel or known exposure to [place]  The importance of social distancing was discussed today.   Review of Systems  Constitutional: Negative for chills and fever.  HENT: Positive for congestion. Negative for ear pain.   Eyes: Negative for pain and redness.  Respiratory: Positive for cough, sputum production, shortness of breath and wheezing.   Cardiovascular: Positive for chest pain. Negative for palpitations and leg swelling.  Gastrointestinal: Negative for abdominal pain, blood in stool, constipation, diarrhea, nausea and vomiting.  Genitourinary: Negative for dysuria.  Musculoskeletal: Negative for falls and myalgias.  Skin: Negative for rash.  Neurological: Negative for dizziness.  Psychiatric/Behavioral: Negative for depression. The patient is not nervous/anxious.       Past Medical History:  Diagnosis Date  . Allergic rhinitis due to pollen   . Chronic kidney disease, stage III (moderate) ([place]) 12/19/2012  . Depression   . Diabetes mellitus ([place])   . Diverticulosis   . Erectile dysfunction associated with type 2 diabetes mellitus ([place]) 05/28/2015  . Former very heavy  cigarette smoker (more than 40 per day) 10/07/2014  . GERD (gastroesophageal reflux disease)   . Gout   . Hyperlipidemia   . Hypertension   . Hypogonadism male 06/14/2012  . Memory loss   . Non-small cell carcinoma of right lung, stage 3 ([place]) 10/23/2019   . Osteoarthritis   . Personal history of colonic polyps    1996    reports that he quit smoking about 19 years ago. His smoking use included cigarettes. He has a 105.00 pack-year smoking history. He has never used smokeless tobacco. He reports that he does not drink alcohol and does not use drugs.   Current Outpatient Medications:  .  acetaminophen (QC ACETAMINOPHEN 8HR ARTH PAIN) 650 MG CR tablet, Take 650 mg by mouth every 8 (eight) hours as needed for pain., Disp: , Rfl:  .  albuterol (VENTOLIN HFA) 108 (90 Base) MCG/ACT inhaler, Inhale 1-2 puffs into the lungs every 6 (six) hours as needed for wheezing or shortness of breath., Disp: 8 g, Rfl: 2 .  ampicillin (PRINCIPEN) 500 MG capsule, Take 500 mg by mouth daily as needed (flare up). , Disp: , Rfl:  .  Ascorbic Acid (VITAMIN C) 1000 MG tablet, Take 1,000 mg by mouth daily., Disp: , Rfl:  .  aspirin EC 81 MG tablet, Take 81 mg by mouth daily. Swallow whole., Disp: , Rfl:  .  atenolol (TENORMIN) 50 MG tablet, Take 50 mg by mouth daily., Disp: , Rfl:  .  cholecalciferol (VITAMIN D) 1000 UNITS tablet, Take 2,000 Units by mouth daily. , Disp: , Rfl:  .  donepezil (ARICEPT) 10 MG tablet, TAKE 1 TABLET BY MOUTH AT BEDTIME (Patient taking differently: Take 10 mg by mouth at bedtime. ), Disp: 90 tablet, Rfl: 3 .  fluticasone (FLONASE) 50 MCG/ACT nasal spray, PLACE 2 SPRAYS IN EACH NOSTRIL DAILY (Patient taking differently: Place 2 sprays into both nostrils daily as needed for allergies or rhinitis. ), Disp: 48 mL, Rfl: 3 .  Hyprom-Naphaz-Polysorb-Zn Sulf (CLEAR EYES COMPLETE OP), Place 1 drop into both eyes daily as needed (dry eyes)., Disp: , Rfl:  .  Insulin Pen Needle (PEN NEEDLES) 31G X 5 MM MISC, 1 each by Does not apply route 3 (three) times daily. To inject insulin E11.42, Disp: 90 each, Rfl: 2 .  insulin regular human CONCENTRATED (HUMULIN R U-500 KWIKPEN) 500 UNIT/ML kwikpen, Inject under skin 80-90 units before b'fast, 60 units before lunch,  80-90 units vefore dinner (Patient taking differently: Inject 20-40 Units into the skin See admin instructions. Takes 35-40 units before breakfast, 20 units at lunch and 35-40 units at night), Disp: 6 pen, Rfl: 5 .  lisinopril (ZESTRIL) 10 MG tablet, Take 10 mg by mouth daily., Disp: , Rfl:  .  memantine (NAMENDA) 10 MG tablet, TAKE 1 TABLET BY MOUTH TWICE A DAY (Patient taking differently: Take 10 mg by mouth 2 (two) times daily. ), Disp: 180 tablet, Rfl: 3 .  montelukast (SINGULAIR) 10 MG tablet, Take 1 tablet (10 mg total) by mouth at bedtime., Disp: 30 tablet, Rfl: 3 .  pantoprazole (PROTONIX) 20 MG tablet, Take 1 tablet (20 mg total) by mouth daily., Disp: 30 tablet, Rfl: 2 .  potassium chloride SA (KLOR-CON) 20 MEQ tablet, Take 1 tablet (20 mEq total) by mouth 2 (two) times daily., Disp: 14 tablet, Rfl: 0 .  prochlorperazine (COMPAZINE) 10 MG tablet, Take 1 tablet (10 mg total) by mouth every 6 (six) hours as needed. (Patient taking differently: Take 10 mg  by mouth every 6 (six) hours as needed for nausea. ), Disp: 30 tablet, Rfl: 2 .  simvastatin (ZOCOR) 40 MG tablet, TAKE 1 TABLET BY MOUTH AT BEDTIME (Patient taking differently: Take 40 mg by mouth every evening. ), Disp: 90 tablet, Rfl: 2 .  sucralfate (CARAFATE) 1 g tablet, Take 1 tablet (1 g total) by mouth 4 (four) times daily. Dissolve each tablet in 15 cc water before use., Disp: 120 tablet, Rfl: 2 .  venlafaxine XR (EFFEXOR-XR) 75 MG 24 hr capsule, TAKE 1 CAPSULE BY MOUTH EVERY MORNING AND 2 CAPSULES AT BEDTIME, Disp: 270 capsule, Rfl: 1   Observations/Objective: There were no vitals taken for this visit. Lab Results  Component Value Date   HGBA1C 10.2 (H) 11/23/2019    Physical Exam  Physical Exam Constitutional:      General: The patient is not in acute distress. Pulmonary:     Effort: Pulmonary effort is normal. No respiratory distress.  Neurological:     Mental Status: The patient is alert and oriented to person, place,  and time.  Psychiatric:        Mood and Affect: Mood normal.        Behavior: Behavior normal.   Assessment and Plan Complicated patient immunocompromised from chemo with  Lung cancer, COPD and poorly controlled DM. Recent CT concerning for RLL infection.  Minimally responsive to Augmentin and SE  Allergic to tetracyclines.  Will broaden with antibiotiics... change to levofloxacin.   Given COPD exacerbation.. use albuterol and add prednisone taper.. will use lower dose given DM and have pt follow home CBGs closely.  Return and ER precautions provided.   CC: Oncology     I discussed the assessment and treatment plan with the patient. The patient was provided an opportunity to ask questions and all were answered. The patient agreed with the plan and demonstrated an understanding of the instructions.   The patient was advised to call back or seek an in-person evaluation if the symptoms worsen or if the condition fails to improve as anticipated.     Eliezer Lofts, MD

## 2020-02-25 ENCOUNTER — Inpatient Hospital Stay: Payer: Medicare Other

## 2020-02-25 ENCOUNTER — Telehealth: Payer: Self-pay | Admitting: *Deleted

## 2020-02-25 ENCOUNTER — Other Ambulatory Visit: Payer: Self-pay

## 2020-02-25 ENCOUNTER — Other Ambulatory Visit: Payer: Self-pay | Admitting: *Deleted

## 2020-02-25 ENCOUNTER — Ambulatory Visit: Payer: Medicare Other

## 2020-02-25 VITALS — BP 108/56 | HR 90 | Resp 18

## 2020-02-25 DIAGNOSIS — E119 Type 2 diabetes mellitus without complications: Secondary | ICD-10-CM | POA: Diagnosis not present

## 2020-02-25 DIAGNOSIS — Z794 Long term (current) use of insulin: Secondary | ICD-10-CM | POA: Diagnosis not present

## 2020-02-25 DIAGNOSIS — Z87891 Personal history of nicotine dependence: Secondary | ICD-10-CM | POA: Diagnosis not present

## 2020-02-25 DIAGNOSIS — Z9181 History of falling: Secondary | ICD-10-CM | POA: Diagnosis not present

## 2020-02-25 DIAGNOSIS — Z5111 Encounter for antineoplastic chemotherapy: Secondary | ICD-10-CM | POA: Diagnosis not present

## 2020-02-25 DIAGNOSIS — IMO0002 Reserved for concepts with insufficient information to code with codable children: Secondary | ICD-10-CM

## 2020-02-25 DIAGNOSIS — E1142 Type 2 diabetes mellitus with diabetic polyneuropathy: Secondary | ICD-10-CM | POA: Diagnosis not present

## 2020-02-25 DIAGNOSIS — N1831 Chronic kidney disease, stage 3a: Secondary | ICD-10-CM | POA: Diagnosis not present

## 2020-02-25 DIAGNOSIS — E1122 Type 2 diabetes mellitus with diabetic chronic kidney disease: Secondary | ICD-10-CM | POA: Diagnosis not present

## 2020-02-25 DIAGNOSIS — E11621 Type 2 diabetes mellitus with foot ulcer: Secondary | ICD-10-CM | POA: Diagnosis not present

## 2020-02-25 DIAGNOSIS — J45909 Unspecified asthma, uncomplicated: Secondary | ICD-10-CM | POA: Diagnosis not present

## 2020-02-25 DIAGNOSIS — C3431 Malignant neoplasm of lower lobe, right bronchus or lung: Secondary | ICD-10-CM | POA: Diagnosis not present

## 2020-02-25 DIAGNOSIS — C3491 Malignant neoplasm of unspecified part of right bronchus or lung: Secondary | ICD-10-CM | POA: Diagnosis not present

## 2020-02-25 DIAGNOSIS — I10 Essential (primary) hypertension: Secondary | ICD-10-CM

## 2020-02-25 DIAGNOSIS — I129 Hypertensive chronic kidney disease with stage 1 through stage 4 chronic kidney disease, or unspecified chronic kidney disease: Secondary | ICD-10-CM | POA: Diagnosis not present

## 2020-02-25 DIAGNOSIS — D6481 Anemia due to antineoplastic chemotherapy: Secondary | ICD-10-CM | POA: Diagnosis not present

## 2020-02-25 DIAGNOSIS — R000 Tachycardia, unspecified: Secondary | ICD-10-CM | POA: Diagnosis not present

## 2020-02-25 DIAGNOSIS — G4733 Obstructive sleep apnea (adult) (pediatric): Secondary | ICD-10-CM | POA: Diagnosis not present

## 2020-02-25 DIAGNOSIS — E1165 Type 2 diabetes mellitus with hyperglycemia: Secondary | ICD-10-CM | POA: Diagnosis not present

## 2020-02-25 DIAGNOSIS — S2232XD Fracture of one rib, left side, subsequent encounter for fracture with routine healing: Secondary | ICD-10-CM | POA: Diagnosis not present

## 2020-02-25 DIAGNOSIS — C3490 Malignant neoplasm of unspecified part of unspecified bronchus or lung: Secondary | ICD-10-CM

## 2020-02-25 DIAGNOSIS — D649 Anemia, unspecified: Secondary | ICD-10-CM

## 2020-02-25 DIAGNOSIS — L97429 Non-pressure chronic ulcer of left heel and midfoot with unspecified severity: Secondary | ICD-10-CM | POA: Diagnosis not present

## 2020-02-25 DIAGNOSIS — E041 Nontoxic single thyroid nodule: Secondary | ICD-10-CM | POA: Diagnosis not present

## 2020-02-25 LAB — CBC WITH DIFFERENTIAL (CANCER CENTER ONLY)
Abs Immature Granulocytes: 0.46 10*3/uL — ABNORMAL HIGH (ref 0.00–0.07)
Basophils Absolute: 0 10*3/uL (ref 0.0–0.1)
Basophils Relative: 0 %
Eosinophils Absolute: 0 10*3/uL (ref 0.0–0.5)
Eosinophils Relative: 0 %
HCT: 18.3 % — ABNORMAL LOW (ref 39.0–52.0)
Hemoglobin: 6.2 g/dL — CL (ref 13.0–17.0)
Immature Granulocytes: 6 %
Lymphocytes Relative: 4 %
Lymphs Abs: 0.3 10*3/uL — ABNORMAL LOW (ref 0.7–4.0)
MCH: 30.1 pg (ref 26.0–34.0)
MCHC: 33.9 g/dL (ref 30.0–36.0)
MCV: 88.8 fL (ref 80.0–100.0)
Monocytes Absolute: 0.6 10*3/uL (ref 0.1–1.0)
Monocytes Relative: 7 %
Neutro Abs: 6.7 10*3/uL (ref 1.7–7.7)
Neutrophils Relative %: 83 %
Platelet Count: 43 10*3/uL — ABNORMAL LOW (ref 150–400)
RBC: 2.06 MIL/uL — ABNORMAL LOW (ref 4.22–5.81)
RDW: 15.5 % (ref 11.5–15.5)
WBC Count: 8.1 10*3/uL (ref 4.0–10.5)
nRBC: 0.9 % — ABNORMAL HIGH (ref 0.0–0.2)

## 2020-02-25 LAB — CMP (CANCER CENTER ONLY)
ALT: 16 U/L (ref 0–44)
AST: 8 U/L — ABNORMAL LOW (ref 15–41)
Albumin: 2.3 g/dL — ABNORMAL LOW (ref 3.5–5.0)
Alkaline Phosphatase: 113 U/L (ref 38–126)
Anion gap: 10 (ref 5–15)
BUN: 23 mg/dL (ref 8–23)
CO2: 29 mmol/L (ref 22–32)
Calcium: 8.5 mg/dL — ABNORMAL LOW (ref 8.9–10.3)
Chloride: 96 mmol/L — ABNORMAL LOW (ref 98–111)
Creatinine: 1.56 mg/dL — ABNORMAL HIGH (ref 0.61–1.24)
GFR, Estimated: 46 mL/min — ABNORMAL LOW (ref >60)
Glucose, Bld: 238 mg/dL — ABNORMAL HIGH (ref 70–99)
Potassium: 3.4 mmol/L — ABNORMAL LOW (ref 3.5–5.1)
Sodium: 135 mmol/L (ref 135–145)
Total Bilirubin: 0.4 mg/dL (ref 0.3–1.2)
Total Protein: 6 g/dL — ABNORMAL LOW (ref 6.5–8.1)

## 2020-02-25 LAB — PREPARE RBC (CROSSMATCH)

## 2020-02-25 LAB — SAMPLE TO BLOOD BANK

## 2020-02-25 MED ORDER — SODIUM CHLORIDE 0.9% FLUSH
10.0000 mL | INTRAVENOUS | Status: DC | PRN
  Filled 2020-02-25: qty 10

## 2020-02-25 MED ORDER — HEPARIN SOD (PORK) LOCK FLUSH 100 UNIT/ML IV SOLN
500.0000 [IU] | Freq: Once | INTRAVENOUS | Status: DC | PRN
  Filled 2020-02-25: qty 5

## 2020-02-25 NOTE — Telephone Encounter (Signed)
I called and spoke with the patient and we will follow up with him in a few weeks after completion of chemo to see if he's ready to proceed with repeat MRI brain and PCI.

## 2020-02-25 NOTE — Chronic Care Management (AMB) (Signed)
Chronic Care Management Pharmacy   Name: [name]          [name]: [id number]        DOB: 1945/07/26   Chief Complaint/ HPI   [name],  74 y.o. , male presents for their follow CCM visit with the clinical pharmacist via telephone due to COVID-19 Pandemic.   PCP : Dustin Loffler, MD   Their chronic conditions include: hypertension, allergic rhiniits, GERD, diabetes type 2, osteoarthritis, major depressive disorder, hypertriglyceridemia.   Patient concerns: reports last cycle of chemo next week; primary concern is cancer treatment, foot wounds are improving, watching what he eats for his diabetes    Office Visits:  10/10/2019 - ordered lung CT revealed possible cancer in lungs. PET scan ordered. Referral to oncology.   09/26/2019 - testosterone injection and nystatin prescribed for possible thrush. [name] Kitchen   09/20/2019 - trying Singulair in addition to Star. Possible change from Allegra to Xyzal. Stop prednisone if CBGs not coming down. Can temporarily increase Novolin N up 5 units twice daily.   09/12/2019 - testosterone injection.   09/06/2019 - video visit for sinus pressure - treat with low dose prednisone taper.   08/29/2019 - testosterone injection.   08/15/2019 - testosterone injection.   08/02/2019 - testosterone injection.   07/18/2019 - bp stable. Diabetes is doing better. Flomax for urine.   07/04/2019 - testosterone injection  Consult Visit:  01/28/20: Endocrinology - increase: - U500 insulin 40-40-40 units 30 min before B-L-D (may increase by 5 units if sugars are still high after starting Ozempic and also in the days of Chemotherapy if you take Dexamethasone) Please start: - Ozempic 0.25 mg weekly in a.m. (for example on Sunday morning) x 4 weeks, then increase to 0.5 mg weekly in a.m. if no nausea or hypoglycemia. RTC 3 months  12/11/2019 - Oncology - wound improving. Off of antibiotics.   12/10/2019 - Wound Care visit. Continue Wound Vac changing  M/W/F.   12/03/2019 - Oncology - currently on amoxicillin and Cipro for foot wound. Glucose 490 on routine labs. Patient did not take his insulin today. Counseled to glucose monitoring.   12/03/2019 - Wound Care visit after surgical debridement.   11/22/2019 - ED to hospital admission for sepsis for foot wound. Surgical debridement and wound vac.   11/19/2019 - Oncology Visit. Insulin given in office. Arranging for port-a-cath placement.   11/15/2019 - ED visit for 2 syncopal episodes. IV fluids given. EKG is normal sinus rhythm.   11/05/2019 - oncology visit - assess treatment. Neulasta support. Daily treatment administered.   10/30/2019  - oncology treatment with carboplatin and taxol added.   10/23/2019 - oncology visit - daily treatment. Chemo treatment ordered. Compazine 10 mg every 6 hours prn nausea.   10/17/2019 - endobronchial ultrasound.   10/16/2019 - Endocrinology - increase doses to 50-40-50 units  U500.   10/12/2019 - ED to Hospital admission.   10/08/2019 - Wound Care visit. fall due to getting up without brace or walker.   10/03/2019 [name] training appointment.   09/17/2019 - Endocrinology - patient is off Ozempic due to cost in doughnut hole. Patient refusing referral to nutrition. CGM is too expensive and patient's neuropathy limits checking ability. Patient's diet consists of dried fruit and sweetened fruit.   Current Outpatient Medications on File Prior to Visit  Medication Sig Dispense Refill  . acetaminophen (QC ACETAMINOPHEN 8HR ARTH PAIN) 650 MG CR tablet Take 650 mg by mouth every 8 (eight)  hours as needed for pain.    [name] Kitchen albuterol (VENTOLIN HFA) 108 (90 Base) MCG/ACT inhaler Inhale 1-2 puffs into the lungs every 6 (six) hours as needed for wheezing or shortness of breath. 8 g 2  . ampicillin (PRINCIPEN) 500 MG capsule Take 500 mg by mouth daily as needed (flare up).     . Ascorbic Acid (VITAMIN C) 1000 MG tablet Take 1,000 mg by mouth daily.    [name] Kitchen aspirin  EC 81 MG tablet Take 81 mg by mouth daily. Swallow whole.    [name] Kitchen atenolol (TENORMIN) 50 MG tablet Take 50 mg by mouth daily.    . cholecalciferol (VITAMIN D) 1000 UNITS tablet Take 2,000 Units by mouth daily.     [name] Kitchen donepezil (ARICEPT) 10 MG tablet TAKE 1 TABLET BY MOUTH AT BEDTIME (Patient taking differently: Take 10 mg by mouth at bedtime. ) 90 tablet 3  . fluticasone (FLONASE) 50 MCG/ACT nasal spray PLACE 2 SPRAYS IN EACH NOSTRIL DAILY (Patient taking differently: Place 2 sprays into both nostrils daily as needed for allergies or rhinitis. ) 48 mL 3  . Hyprom-Naphaz-Polysorb-Zn Sulf (CLEAR EYES COMPLETE OP) Place 1 drop into both eyes daily as needed (dry eyes).    . Insulin Pen Needle (PEN NEEDLES) 31G X 5 MM MISC 1 each by Does not apply route 3 (three) times daily. To inject insulin E11.42 90 each 2  . insulin regular human CONCENTRATED (HUMULIN R U-500 KWIKPEN) 500 UNIT/ML kwikpen Inject under skin 80-90 units before b'fast, 60 units before lunch, 80-90 units vefore dinner (Patient taking differently: Inject 20-40 Units into the skin See admin instructions. Takes 35-40 units before breakfast, 20 units at lunch and 35-40 units at night) 6 pen 5  . levofloxacin (LEVAQUIN) 500 MG tablet Take 1 tablet (500 mg total) by mouth daily. 7 tablet 0  . lisinopril (ZESTRIL) 10 MG tablet Take 10 mg by mouth daily.    . memantine (NAMENDA) 10 MG tablet TAKE 1 TABLET BY MOUTH TWICE A DAY (Patient taking differently: Take 10 mg by mouth 2 (two) times daily. ) 180 tablet 3  . montelukast (SINGULAIR) 10 MG tablet Take 1 tablet (10 mg total) by mouth at bedtime. 30 tablet 3  . pantoprazole (PROTONIX) 20 MG tablet Take 1 tablet (20 mg total) by mouth daily. 30 tablet 2  . potassium chloride SA (KLOR-CON) 20 MEQ tablet Take 1 tablet (20 mEq total) by mouth 2 (two) times daily. 14 tablet 0  . predniSONE (DELTASONE) 10 MG tablet 3 tabs by mouth daily x 3 days, then 2 tabs by mouth daily x 2 days then 1 tab by mouth daily  x 2 days 15 tablet 0  . prochlorperazine (COMPAZINE) 10 MG tablet Take 1 tablet (10 mg total) by mouth every 6 (six) hours as needed. (Patient taking differently: Take 10 mg by mouth every 6 (six) hours as needed for nausea. ) 30 tablet 2  . simvastatin (ZOCOR) 40 MG tablet TAKE 1 TABLET BY MOUTH AT BEDTIME (Patient taking differently: Take 40 mg by mouth every evening. ) 90 tablet 2  . sucralfate (CARAFATE) 1 g tablet Take 1 tablet (1 g total) by mouth 4 (four) times daily. Dissolve each tablet in 15 cc water before use. 120 tablet 2  . venlafaxine XR (EFFEXOR-XR) 75 MG 24 hr capsule TAKE 1 CAPSULE BY MOUTH EVERY MORNING AND 2 CAPSULES AT BEDTIME 270 capsule 1   No current facility-administered medications on file prior to visit.  Allergies  Allergen Reactions  . Tetracycline Itching and Rash   Current Diagnosis/Assessment:   Goals         .  Pharmacy Care Plan (pt-stated)      CARE PLAN ENTRY (see longitudinal plan of care for additional care plan information)  Current Barriers:  . Chronic Disease Management support, education, and care coordination needs related to Hypertension, Hyperlipidemia, and Diabetes   Hypertension BP Readings from Last 3 Encounters:  12/15/19 (!) 152/88  12/13/19 (!) 153/89  12/12/19 (!) 161/89   . Pharmacist Clinical Goal(s): o Over the next 90 days, patient will work with PharmD and providers to maintain BP goal <130/80 . Current regimen:  o Lisinopril 10 mg daily o Atenolol 50 mg daily . Interventions: o Reviewed patient's medication list.  o Reviewed patient's home blood pressure readings. Home readings are at or below goal.  . Patient self care activities - Over the next 90 days, patient will: o Check BP weekly, document, and provide at future appointments o Ensure daily salt intake < 2300 mg/day  Hyperlipidemia Lab Results  Component Value Date/Time   LDLCALC 43 04/24/2009 12:00 AM   LDLDIRECT 82.0 01/03/2019 11:22 AM   . Pharmacist  Clinical Goal(s): o Over the next 90 days, patient will work with PharmD and providers to achieve LDL goal < 100 . Current regimen:  o Simvastatin 40 mg daily.  . Interventions: o Reviewed patient's medications.  o Dicussed diet and exercise.  o Recommend updating cholesterol labs . Patient self care activities - Over the next 90 days, patient will: . Slowly increase exercise with goal of 30 minutes, 5 days per week . Incorporate a healthy diet high in vegetables, fruits and whole grains with low-fat dairy products, chicken, fish, legumes, non-tropical vegetable oils and nuts. Limit intake of sweets, sugar-sweetened beverages and red meats. o Continue to take medication as prescribed.   Diabetes Lab Results  Component Value Date/Time   HGBA1C 10.2 (H) 11/23/2019 02:33 AM   HGBA1C 10.6 (H) 10/10/2019 12:21 PM   . Pharmacist Clinical Goal(s): o Over the next 90 days, patient will work with PharmD and providers to achieve A1c goal <7% . Current regimen:  o Humulin U500 30 units tid . Interventions: o Reviewed home blood sugar readings and congratulated on improvement o Reviewed Ozempic tolerance and administration o Offered to contact endocrinology to discuss Ozempic dose increase, patient declined . Patient self care activities - Over the next 90 days, patient will: o Check blood sugar 3-4 times daily, document, and provide at future appointments o Contact provider with any episodes of hypoglycemia  Medication management . Pharmacist Clinical Goal(s): o Over the next 90 days, patient will work with PharmD and providers to achieve optimal medication adherence . Current pharmacy: [facility] . Interventions o Comprehensive medication review performed. o Continue current medication management strategy . Patient self care activities - Over the next 90 days, patient will: o Focus on medication adherence by continuing to use pill box o Take medications as prescribed o Report any  questions or concerns to PharmD and/or provider(s)  Updated goal documentation     .       Diabetes    Lab Results  Component Value Date   CREATININE 1.22 02/18/2020   BUN 20 02/18/2020   GFR 45.06 (L) 10/10/2019   GFRNONAA >60 02/18/2020   GFRAA >60 01/14/2020   NA 132 (L) 02/18/2020   K 4.2 02/18/2020   CALCIUM 8.3 (L) 02/18/2020  CO2 29 02/18/2020   Recent Relevant Labs: Lab Results  Component Value Date/Time   HGBA1C 10.2 (H) 11/23/2019 02:33 AM   HGBA1C 10.6 (H) 10/10/2019 12:21 PM   MICROALBUR 57.6 (H) 01/03/2019 11:25 AM   MICROALBUR 34.0 (H) 12/29/2017 09:14 AM    Checking BG: 4x a day   Recent FBG Readings: 140  Recent 2hr PP BG readings: 220s ** Low - reports BG was 46 one night, woke up feeling bad, ate a pack of crackers and orange juice, rechecked 15 minutes and it was coming back up, only one low in past month, denies skipping meals or taking extra insulin; denies any others less than 70   Patient has failed these meds in past: none reported Patient is currently uncontrolled on the following medications:   Humulin R U-500 - 40 units 30 minutes before meals TID (may increase by 5 units if sugars are still high after starting Ozempic and also in the days of Chemotherapy if you take Dexamethasone)  Ozempic 0.5 mg - Inject once weekly   Freestyle Libre CGM   Exercise: uses 2 lbs weights with arms. Working legs while sitting in wheel chair each day. Hopes to be able to walk to get a1c down after foot wound heals.    Update 02/25/20: A1c improved to 9% per patient report. Reports he has been working on his diet. Continues to eat oatmeal most mornings, occasionally eggs, and mostly vegetables at lunch and dinner. Doesn't eat much meat. Confirms he started Ozempic and its going well, maybe a slight decrease in appetite. Denies n/v/d. Reports he starting on 0.5 mg weekly as he was not able to find the 0.25 mg dose line. Confirms taking 40 units U-500 30 minutes  before meals, TID. Denies needing any extra doses or dose adjustments due to his steroids with chemo. Continues to wear Freestyle Meter and scanning 4 times per day. Discussed fasting readings are near goal. Room for some improvement in post-prandial readings. Pt would like to defer changes until next appt with Dr. Cruzita Lederer in January 2022.   Plan: Continue current medications; CMA call in December for BG log and dietary review to encourage progress.   Hypertension   CMP Latest Ref Rng & Units 02/18/2020 02/11/2020 02/04/2020  Glucose 70 - 99 mg/dL 359(H) 346(H) 148(H)  BUN 8 - 23 mg/dL $Remove'20 14 10  'HLVfpWM$ Creatinine 0.61 - 1.24 mg/dL 1.22 1.35(H) 1.07  Sodium 135 - 145 mmol/L 132(L) 134(L) 136  Potassium 3.5 - 5.1 mmol/L 4.2 4.0 3.0(LL)  Chloride 98 - 111 mmol/L 97(L) 96(L) 98  CO2 22 - 32 mmol/L 29 30 32  Calcium 8.9 - 10.3 mg/dL 8.3(L) 8.8(L) 8.7(L)  Total Protein 6.5 - 8.1 g/dL 6.1(L) 6.4(L) 6.2(L)  Total Bilirubin 0.3 - 1.2 mg/dL 0.6 0.4 0.5  Alkaline Phos 38 - 126 U/L 133(H) 111 117  AST 15 - 41 U/L 9(L) 9(L) 8(L)  ALT 0 - 44 U/L $Remo'8 8 10   'cVifR$ Office blood pressures are: BP Readings from Last 3 Encounters:  02/25/20 (!) 108/56  02/15/20 (!) 156/79  02/13/20 138/69   BP goal < 130/80 mmHg Patient has failed these meds in the past: metoprolol Patient is currently controlled on the following medications:   Atenolol 50 mg daily   Lisinopril 10 mg daily   Update 02/25/20: Patient home BP readings are ranging: today 11/15 - 126/63 mmHg, HR 93, oxygen 98%, continues to check BP three days a week by podiatry nurse, readings are within  goal    Plan: Continue current medications    Hyperlipidemia    LDL goal < 100  Last lipids Lab Results  Component Value Date   CHOL 172 01/03/2019   HDL 31.40 (L) 01/03/2019   LDLCALC 43 04/24/2009   LDLDIRECT 82.0 01/03/2019   TRIG (H) 01/03/2019    521.0 Triglyceride is over 400; calculations on Lipids are invalid.   CHOLHDL 5 01/03/2019   Hepatic  Function Latest Ref Rng & Units 02/18/2020 02/11/2020 02/04/2020  Total Protein 6.5 - 8.1 g/dL 6.1(L) 6.4(L) 6.2(L)  Albumin 3.5 - 5.0 g/dL 2.6(L) 2.5(L) 2.7(L)  AST 15 - 41 U/L 9(L) 9(L) 8(L)  ALT 0 - 44 U/L $Remo'8 8 10  'SmAec$ Alk Phosphatase 38 - 126 U/L 133(H) 111 117  Total Bilirubin 0.3 - 1.2 mg/dL 0.6 0.4 0.5  Bilirubin, Direct 0.0 - 0.3 mg/dL - - -     The 10-year ASCVD risk score Mikey Bussing DC Jr., et al., 2013) is: 39.2%   Values used to calculate the score:     Age: 34 years     Sex: Male     Is Non-Hispanic African American: No     Diabetic: Yes     Tobacco smoker: No     Systolic Blood Pressure: 888 mmHg     Is BP treated: Yes     HDL Cholesterol: 31.4 mg/dL     Total Cholesterol: 172 mg/dL    Patient has failed these meds in past: n/a Patient is currently uncontrolled on the following medications:   Simvastatin 40 mg daily in the evening  Aspirin 81 mg daily    We discussed:  diet and exercise extensively  Update 02/25/20: TG likely to come down with better DM control. Current therapy appropriate.    Plan: Continue current medications    Mild Cognitive Impairment    Patient has failed these meds in past: n/a Patient is currently controlled on the following medications:   Donepezil 10 mg daily at bedtime  Memantine 10 mg bid   We discussed:  Patient reports that his mom has alzheimer's disease and lives with him. His late wife was concerned with his memory issues and he was started on these medications. He denies any side effects with therapy and states his memory is good. Patient is able to list names of medications and indications.   No change since initial visit on 12/24/19   Plan: Continue current medications   GERD    Patient has failed these meds in past: n/a  Patient is currently controlled on the following medications:   Omeprazole 20 mg daily   We discussed: Patient endorses symptoms of acid reflux for years. Discussed benefits of elevating head of bed at  night, small meals, avoiding tight waistbands. Recommended patient consider eating small meals throughout the day to control symptoms.   No change since initial visit on 12/24/19   Plan: Continue current medications   Health Maintenance    Patient is currently controlled on the following medications:   Acetaminophen 650 mg CR every 8 hours as needed - pain  Venlafaxine XR 75 mg every morning and 150 mg at bedtime - neuropathy/nerves  Vitamin C 1000 mg daily - supplementation  Vitamin D 1000 units daily - supplementation   Flonase nasal spray - 2 sprays in each nostril daily prn allergies or rhinitis  Singulair 10 mg daily at bedtime - allergies  Clear eyes complete 1 drop in both eyes daily prn - dry eyes  Ampicillin 500 mg daily - per dermatology for skin condition   We discussed:  Patient reports good control of symptoms at this time.    Plan: Continue current medications   Medication Management    Pt uses [facility] for all medications Uses pill box? Yes Pt endorses good compliance using 2 week pill box.  Reports his medications run out at about the same time each 90 days   We discussed: Patient does not have a problem [facility] He likes [facility] and does not see a benefit of packaging or delivery at this time.    Plan: Continue current medication management strategy  Follow up: 3 months, telephone; CMA BG log in 1 month.   Debbora Dus, PharmD Clinical Pharmacist [facility] [phone]

## 2020-02-25 NOTE — Telephone Encounter (Signed)
Advised pt of appt at 9 am tomorrow for blood transfusion & to hold aspirin until platelets above 50 K.  Pt expressed understanding.

## 2020-02-25 NOTE — Patient Instructions (Signed)
Dear [name],  Below is a summary of the goals we discussed during our follow up appointment on February 25, 2020. Please contact me anytime with questions or concerns.   Visit Information  Goals Addressed              This Visit's Progress   .  Pharmacy Care Plan (pt-stated)        CARE PLAN ENTRY (see longitudinal plan of care for additional care plan information)  Current Barriers:  . Chronic Disease Management support, education, and care coordination needs related to Hypertension, Hyperlipidemia, and Diabetes   Hypertension BP Readings from Last 3 Encounters:  12/15/19 (!) 152/88  12/13/19 (!) 153/89  12/12/19 (!) 161/89   . Pharmacist Clinical Goal(s): o Over the next 90 days, patient will work with PharmD and providers to maintain BP goal <130/80 . Current regimen:  o Lisinopril 10 mg daily o Atenolol 50 mg daily . Interventions: o Reviewed patient's medication list.  o Reviewed patient's home blood pressure readings. Home readings are at or below goal.  . Patient self care activities - Over the next 90 days, patient will: o Check BP weekly, document, and provide at future appointments o Ensure daily salt intake < 2300 mg/day  Hyperlipidemia Lab Results  Component Value Date/Time   LDLCALC 43 04/24/2009 12:00 AM   LDLDIRECT 82.0 01/03/2019 11:22 AM   . Pharmacist Clinical Goal(s): o Over the next 90 days, patient will work with PharmD and providers to achieve LDL goal < 100 . Current regimen:  o Simvastatin 40 mg daily.  . Interventions: o Reviewed patient's medications.  o Dicussed diet and exercise.  o Recommend updating cholesterol labs . Patient self care activities - Over the next 90 days, patient will: . Slowly increase exercise with goal of 30 minutes, 5 days per week . Incorporate a healthy diet high in vegetables, fruits and whole grains with low-fat dairy products, chicken, fish, legumes, non-tropical vegetable oils and nuts. Limit intake  of sweets, sugar-sweetened beverages and red meats. o Continue to take medication as prescribed.   Diabetes Lab Results  Component Value Date/Time   HGBA1C 10.2 (H) 11/23/2019 02:33 AM   HGBA1C 10.6 (H) 10/10/2019 12:21 PM   . Pharmacist Clinical Goal(s): o Over the next 90 days, patient will work with PharmD and providers to achieve A1c goal <7% . Current regimen:  o Humulin U500 30 units tid . Interventions: o Reviewed home blood sugar readings and congratulated on improvement o Reviewed Ozempic tolerance and administration o Offered to contact endocrinology to discuss Ozempic dose increase, patient declined . Patient self care activities - Over the next 90 days, patient will: o Check blood sugar 3-4 times daily, document, and provide at future appointments o Contact provider with any episodes of hypoglycemia  Medication management . Pharmacist Clinical Goal(s): o Over the next 90 days, patient will work with PharmD and providers to achieve optimal medication adherence . Current pharmacy: [facility] . Interventions o Comprehensive medication review performed. o Continue current medication management strategy . Patient self care activities - Over the next 90 days, patient will: o Focus on medication adherence by continuing to use pill box o Take medications as prescribed o Report any questions or concerns to PharmD and/or provider(s)  Please see past updates related to this goal by clicking on the "Past Updates" button in the selected goal        The patient verbalized understanding of instructions, educational materials, and care plan  provided today and agreed to receive a mailed copy of patient instructions, educational materials, and care plan.   The pharmacy team will reach out to the patient again over the next 30 days.   Debbora Dus, PharmD Clinical Pharmacist [facility] [phone]   Diabetes Mellitus and Nutrition, Adult When  you have diabetes (diabetes mellitus), it is very important to have healthy eating habits because your blood sugar (glucose) levels are greatly affected by what you eat and drink. Eating healthy foods in the appropriate amounts, at about the same times every day, can help you:  Control your blood glucose.  Lower your risk of heart disease.  Improve your blood pressure.  Reach or maintain a healthy weight. Every person with diabetes is different, and each person has different needs for a meal plan. Your health care provider may recommend that you work with a diet and nutrition specialist (dietitian) to make a meal plan that is best for you. Your meal plan may vary depending on factors such as:  The calories you need.  The medicines you take.  Your weight.  Your blood glucose, blood pressure, and cholesterol levels.  Your activity level.  Other health conditions you have, such as heart or kidney disease. How do carbohydrates affect me? Carbohydrates, also called carbs, affect your blood glucose level more than any other type of food. Eating carbs naturally raises the amount of glucose in your blood. Carb counting is a method for keeping track of how many carbs you eat. Counting carbs is important to keep your blood glucose at a healthy level, especially if you use insulin or take certain oral diabetes medicines. It is important to know how many carbs you can safely have in each meal. This is different for every person. Your dietitian can help you calculate how many carbs you should have at each meal and for each snack. Foods that contain carbs include:  Bread, cereal, rice, pasta, and crackers.  Potatoes and corn.  Peas, beans, and lentils.  Milk and yogurt.  Fruit and juice.  Desserts, such as cakes, cookies, ice cream, and candy. How does alcohol affect me? Alcohol can cause a sudden decrease in blood glucose (hypoglycemia), especially if you use insulin or take certain oral  diabetes medicines. Hypoglycemia can be a life-threatening condition. Symptoms of hypoglycemia (sleepiness, dizziness, and confusion) are similar to symptoms of having too much alcohol. If your health care provider says that alcohol is safe for you, follow these guidelines:  Limit alcohol intake to no more than 1 drink per day for nonpregnant women and 2 drinks per day for men. One drink equals 12 oz of beer, 5 oz of wine, or 1 oz of hard liquor.  Do not drink on an empty stomach.  Keep yourself hydrated with water, diet soda, or unsweetened iced tea.  Keep in mind that regular soda, juice, and other mixers may contain a lot of sugar and must be counted as carbs. What are tips for following this plan?  Reading food labels  Start by checking the serving size on the "Nutrition Facts" label of packaged foods and drinks. The amount of calories, carbs, fats, and other nutrients listed on the label is based on one serving of the item. Many items contain more than one serving per package.  Check the total grams (g) of carbs in one serving. You can calculate the number of servings of carbs in one serving by dividing the total carbs by 15.  For example, if a food has 30 g of total carbs, it would be equal to 2 servings of carbs.  Check the number of grams (g) of saturated and trans fats in one serving. Choose foods that have low or no amount of these fats.  Check the number of milligrams (mg) of salt (sodium) in one serving. Most people should limit total sodium intake to less than 2,300 mg per day.  Always check the nutrition information of foods labeled as "low-fat" or "nonfat". These foods may be higher in added sugar or refined carbs and should be avoided.  Talk to your dietitian to identify your daily goals for nutrients listed on the label. Shopping  Avoid buying canned, premade, or processed foods. These foods tend to be high in fat, sodium, and added sugar.  Shop around the outside edge  of the grocery store. This includes fresh fruits and vegetables, bulk grains, fresh meats, and fresh dairy. Cooking  Use low-heat cooking methods, such as baking, instead of high-heat cooking methods like deep frying.  Cook using healthy oils, such as olive, canola, or sunflower oil.  Avoid cooking with butter, cream, or high-fat meats. Meal planning  Eat meals and snacks regularly, preferably at the same times every day. Avoid going long periods of time without eating.  Eat foods high in fiber, such as fresh fruits, vegetables, beans, and whole grains. Talk to your dietitian about how many servings of carbs you can eat at each meal.  Eat 4-6 ounces (oz) of lean protein each day, such as lean meat, chicken, fish, eggs, or tofu. One oz of lean protein is equal to: ? 1 oz of meat, chicken, or fish. ? 1 egg. ?  cup of tofu.  Eat some foods each day that contain healthy fats, such as avocado, nuts, seeds, and fish. Lifestyle  Check your blood glucose regularly.  Exercise regularly as told by your health care provider. This may include: ? 150 minutes of moderate-intensity or vigorous-intensity exercise each week. This could be brisk walking, biking, or water aerobics. ? Stretching and doing strength exercises, such as yoga or weightlifting, at least 2 times a week.  Take medicines as told by your health care provider.  Do not use any products that contain nicotine or tobacco, such as cigarettes and e-cigarettes. If you need help quitting, ask your health care provider.  Work with a Social worker or diabetes educator to identify strategies to manage stress and any emotional and social challenges. Questions to ask a health care provider  Do I need to meet with a diabetes educator?  Do I need to meet with a dietitian?  What number can I call if I have questions?  When are the best times to check my blood glucose? Where to find more information:  American Diabetes Association:  diabetes.org  Academy of Nutrition and Dietetics: www.eatright.[employer] of Diabetes and Digestive and Kidney Diseases (NIH): [employer] Summary  A healthy meal plan will help you control your blood glucose and maintain a healthy lifestyle.  Working with a diet and nutrition specialist (dietitian) can help you make a meal plan that is best for you.  Keep in mind that carbohydrates (carbs) and alcohol have immediate effects on your blood glucose levels. It is important to count carbs and to use alcohol carefully. This information is not intended to replace advice given to you by your health care provider. Make sure you discuss any questions you have with your health care provider. Document  Revised: 03/11/2017 Document Reviewed: 05/03/2016 Elsevier Patient Education  [place].

## 2020-02-25 NOTE — Progress Notes (Deleted)
[facility]   Name: [name]          [name]: [id number]        DOB: 1945-05-19   Chief Complaint/ HPI   [name],  74 y.o. , male presents for their Initial CCM visit with the clinical pharmacist via telephone due to COVID-19 Pandemic.   PCP : [name], MD   Their chronic conditions include: hypertension, allergic rhiniits, GERD, diabetes type 2, osteoarthritis, major depressive disorder, hypertriglyceridemia.    Office Visits: 10/10/2019 - ordered lung CT revealed possible cancer in lungs. PET scan ordered. Referral to oncology.  09/26/2019 - testosterone injection and nystatin prescribed for possible thrush. [name] Kitchen  09/20/2019 - trying Singulair in addition to [place]. Possible change from Allegra to Xyzal. Stop prednisone if CBGs not coming down. Can temporarily increase Novolin N up 5 units twice daily.  09/12/2019 - testosterone injection.  09/06/2019 - video visit for sinus pressure - treat with low dose prednisone taper.  08/29/2019 - testosterone injection.  08/15/2019 - testosterone injection.  08/02/2019 - testosterone injection.  07/18/2019 - bp stable. Diabetes is doing better. Flomax for urine.  07/04/2019 - testosterone injection.  Consult Visit: 12/11/2019 - Oncology - wound improving. Off of antibiotics.  12/10/2019 - Wound Care visit. Continue Wound Vac changing M/W/F.  12/03/2019 - Oncology - currently on amoxicillin and Cipro for foot wound. Glucose 490 on routine labs. Patient did not take his insulin today. Counseled to glucose monitoring.  12/03/2019 - Wound Care visit after surgical debridement.  11/22/2019 - ED to hospital admission for sepsis for foot wound. Surgical debridement and wound vac.  11/19/2019 - Oncology Visit. Insulin given in office. Arranging for port-a-cath placement.  11/15/2019 - ED visit for 2 syncopal episodes. IV fluids given. EKG is normal sinus rhythm.  11/05/2019 - oncology visit - assess  treatment. Neulasta support. Daily treatment administered.  10/30/2019  - oncology treatment with carboplatin and taxol added.  10/23/2019 - oncology visit - daily treatment. Chemo treatment ordered. Compazine 10 mg every 6 hours prn nausea.  10/17/2019 - endobronchial ultrasound.  10/16/2019 - Endocrinology - increase doses to 50-40-50 units  U500.  10/12/2019 - ED to Hospital admission.  10/08/2019 - Wound Care visit. fall due to getting up without brace or walker.  10/03/2019 [name] training appointment.  09/24/2019 - Wound Care visit - wound debridement.  09/17/2019 - Wound Care visit - wound improving with ampicillin treatment.  09/17/2019 - Endocrinology - patient is off Ozempic due to cost in doughnut hole. Patient refusing referral to nutrition. CGM is too expensive and patient's neuropathy limits checking ability. Patient's diet consists of dried fruit and sweetened fruit.  09/11/2019 - Wound Care visit - antibiotic for pus/blood coming from callus.  08/30/2019 - Wound Care visit - patient did not tolerate offloading shoe due to balance.  08/20/2019 - Wound Care visit - patient's wound is slowly improving.  08/13/2019 - Wound Care visit - wound appears to be doing well.  08/06/2019 - Wound Care visit - debridement of buildup over callus.  07/23/2019 - Wound Care visit - wound appears to be healing.  07/16/2019 - Wound Care visit - no signs of infection.  07/09/2019 - Wound Care visit - no signs of infection.  07/02/2019 - Wound Care visit - debridement.  06/25/2019 - Wound Care visit - debridement.  Medications: Outpatient Encounter Medications as of 12/24/2019  Medication Sig Note  . acetaminophen (QC ACETAMINOPHEN 8HR ARTH PAIN) 650 MG  CR tablet Take 650 mg by mouth every 8 (eight) hours as needed for pain.    [name] Kitchen ampicillin (PRINCIPEN) 500 MG capsule Take 500 mg by mouth daily as needed (flare up).     . Ascorbic Acid (VITAMIN C) 1000 MG tablet Take 1,000 mg by mouth daily.    [name] Kitchen  aspirin EC 81 MG tablet Take 81 mg by mouth daily. Swallow whole.    [name] Kitchen atenolol (TENORMIN) 50 MG tablet Take 50 mg by mouth daily.    . cholecalciferol (VITAMIN D) 1000 UNITS tablet Take 2,000 Units by mouth daily.     [name] Kitchen donepezil (ARICEPT) 10 MG tablet TAKE 1 TABLET BY MOUTH AT BEDTIME (Patient taking differently: Take 10 mg by mouth at bedtime. )    . fluticasone (FLONASE) 50 MCG/ACT nasal spray PLACE 2 SPRAYS IN EACH NOSTRIL DAILY (Patient taking differently: Place 2 sprays into both nostrils daily as needed for allergies or rhinitis. )    . Hyprom-Naphaz-Polysorb-Zn Sulf (CLEAR EYES COMPLETE OP) Place 1 drop into both eyes daily as needed (dry eyes).    . Insulin Pen Needle (PEN NEEDLES) 31G X 5 MM MISC 1 each by Does not apply route 3 (three) times daily. To inject insulin E11.42    . insulin regular human CONCENTRATED (HUMULIN R U-500 KWIKPEN) 500 UNIT/ML kwikpen Inject under skin 80-90 units before b'fast, 60 units before lunch, 80-90 units vefore dinner (Patient taking differently: Inject 20-40 Units into the skin See admin instructions. Takes 35-40 units before breakfast, 20 units at lunch and 35-40 units at night) 11/22/2019: 25 units   . lisinopril (ZESTRIL) 10 MG tablet Take 10 mg by mouth daily.    . memantine (NAMENDA) 10 MG tablet TAKE 1 TABLET BY MOUTH TWICE A DAY (Patient taking differently: Take 10 mg by mouth 2 (two) times daily. )    . montelukast (SINGULAIR) 10 MG tablet Take 1 tablet (10 mg total) by mouth at bedtime.    [name] Kitchen omeprazole (PRILOSEC) 40 MG capsule Take 1 capsule (40 mg total) by mouth 2 (two) times daily before a meal. (Patient taking differently: Take 20 mg by mouth daily. Taking OTC daily.)    . prochlorperazine (COMPAZINE) 10 MG tablet Take 1 tablet (10 mg total) by mouth every 6 (six) hours as needed. (Patient taking differently: Take 10 mg by mouth every 6 (six) hours as needed for nausea. )    . simvastatin (ZOCOR) 40 MG tablet TAKE 1 TABLET BY MOUTH AT BEDTIME  (Patient taking differently: Take 40 mg by mouth every evening. )    . venlafaxine XR (EFFEXOR-XR) 75 MG 24 hr capsule TAKE 1 CAPSULE BY MOUTH EVERY MORNING AND 2 CAPSULES AT BEDTIME    . amoxicillin (AMOXIL) 500 MG capsule Take 1 capsule (500 mg total) by mouth every 8 (eight) hours. (Patient not taking: Reported on 12/24/2019)    . ciprofloxacin (CIPRO) 500 MG tablet Take 1 tablet (500 mg total) by mouth 2 (two) times daily. (Patient not taking: Reported on 12/24/2019)    . guaiFENesin-dextromethorphan (ROBITUSSIN DM) 100-10 MG/5ML syrup Take 5 mLs by mouth every 6 (six) hours as needed for cough. (Patient not taking: Reported on 12/24/2019)    . lidocaine-prilocaine (EMLA) cream Apply 1 application topically as needed. (Patient not taking: Reported on 12/24/2019) 11/30/2019: Not started yet  . metoprolol succinate (TOPROL-XL) 25 MG 24 hr tablet Take 0.5 tablets (12.5 mg total) by mouth daily. (Patient not taking: Reported on 12/24/2019)    . potassium chloride  SA (KLOR-CON) 20 MEQ tablet Take 2 tablets (40 mEq total) by mouth 2 (two) times daily. (Patient not taking: Reported on 12/24/2019)    . sucralfate (CARAFATE) 1 g tablet Take 1 tablet (1 g total) by mouth 4 (four) times daily. Dissolve each tablet in 15 cc water before use. (Patient not taking: Reported on 12/24/2019)    . testosterone cypionate (DEPOTESTOSTERONE CYPIONATE) 200 MG/ML injection Inject 200 mg into the muscle every 14 (fourteen) days. (Patient not taking: Reported on 12/24/2019)      No facility-administered encounter medications on file as of 12/24/2019.    Allergies  Allergen Reactions  . Tetracycline Itching and Rash    SDOH Screenings       Alcohol Screen:   . Last Alcohol Screening Score (AUDIT): Not on file  Depression (PHQ2-9): Low Risk   . PHQ-2 Score: 0  Financial Resource Strain: Low Risk   . Difficulty of Paying Living Expenses: Not hard at all  Food Insecurity: No Food Insecurity  . Worried About Sales executive in the Last Year: Never true  . Ran Out of Food in the Last Year: Never true  Housing: Low Risk   . Last Housing Risk Score: 0  Physical Activity: Inactive  . Days of Exercise per Week: 0 days  . Minutes of Exercise per Session: 0 min  Social Connections:   . Frequency of Communication with Friends and Family: Not on file  . Frequency of Social Gatherings with Friends and Family: Not on file  . Attends Religious Services: Not on file  . Active Member of Clubs or Organizations: Not on file  . Attends Archivist Meetings: Not on file  . Marital Status: Not on file  Stress: No Stress Concern Present  . Feeling of Stress : Not at all  Tobacco Use: Medium Risk  . Smoking Tobacco Use: Former Smoker  . Smokeless Tobacco Use: Never Used  Transportation Needs: No Transportation Needs  . Lack of Transportation (Medical): No  . Lack of Transportation (Non-Medical): No      Current Diagnosis/Assessment:   Goals Addressed   None        Diabetes    Recent Relevant Labs: Labs (Brief)$RemoveBeforeDEI'[]'mhiPEfoTXMDAzmJN$ Expand by Default       Lab Results  Component Value Date/Time    HGBA1C 10.2 (H) 11/23/2019 02:33 AM    HGBA1C 10.6 (H) 10/10/2019 12:21 PM    MICROALBUR 57.6 (H) 01/03/2019 11:25 AM    MICROALBUR 34.0 (H) 12/29/2017 09:14 AM      Kidney Function Labs (Brief)       Lab Results  Component Value Date/Time    CREATININE 1.37 (H) 12/18/2019 01:50 PM    CREATININE 1.33 (H) 12/11/2019 11:50 AM    CREATININE 1.26 11/16/2011 05:13 AM    GFR 45.06 (L) 10/10/2019 12:21 PM    GFRNONAA 50 (L) 12/18/2019 01:50 PM    GFRNONAA 59 (L) 11/16/2011 05:13 AM    GFRAA 58 (L) 12/18/2019 01:50 PM    GFRAA >60 11/16/2011 05:13 AM    K 4.7 12/18/2019 01:50 PM    K 3.7 12/11/2019 11:50 AM    K 4.1 11/16/2011 05:13 AM        Checking BG: 3x per Day   Recent FBG Readings: 226 mg/dL Recent 2hr PP BG readings:  230-240 mg/dL Recent HS BG readings: 280 mg/dL Patient has failed these meds in past:  Ozempic, Humulin N, Humulin R Patient is currently uncontrolled on the following  medications:   Humulin R U-500 35-40 units before breakfast, 20 units before lunch, 35-40 units before dinner.   Insulin pen needle tid   Freestyle Libre CGM   Last diabetic Foot exam:  Labs (Brief)       Lab Results  Component Value Date/Time    HMDIABEYEEXA Retinopathy (A) 08/13/2019 12:00 AM      Last diabetic Eye exam:  Labs (Brief)       Lab Results  Component Value Date/Time    HMDIABFOOTEX 02/05/2010 09/16/2010 12:00 AM        We discussed: diet and exercise extensively. Using U-500 30 units TID. He doesn't like his fasting blood sugar less than 150 mg/dL. Is not sure endocrinology wants to resume Ozempic due to lack of energy and weight loss already with treatment/cancer. Sometimes adds an fourth shot ~15 units of insulin when sugars are running really high. Denies any low blood sugars. Patient is willing to resume Ozempic if will help his blood sugars. He would like to see his next a1c ~8%. Discussed a1c of 8% is an average of ~183 mg/dL.    Nutrition: Has lost 12 lbs with cancer treatments. Eats an egg and toast at breakfast or oatmeal regular with cinnamon and butter. Eating a lot of fruit during his treatment. Lunch typically eats a sandwich. Eats vegetables at dinner (beans, potatoes, carrots, asparagus, broccoli, brussel sprouts). Drinks water with meals. Occasionally drinks diet ginger ale. Drinks gatorade zero once in a while. Discussed being mindful of fruit intake and carbohydrates to have better blood sugar control.    Exercise: uses 2 lbs weights with arms. Working legs while sitting in wheel chair each day. Hopes to be able to walk to get a1c down after foot wound heals.    Plan   Continue current medications and  Hypertension    BP today is:  <130/80   Office blood pressures are     BP Readings from Last 3 Encounters:  12/15/19 (!) 152/88  12/13/19 (!) 153/89  12/12/19  (!) 161/89      Patient has failed these meds in the past: metoprolol Patient is currently controlled on the following medications:   Atenolol 50 mg daily   Lisinopril 10 mg daily  Patient checks BP at home daily   Patient home BP readings are ranging: 126/74 mmHg   We discussed diet and exercise extensively   Plan   Continue current medications    Hyperlipidemia    LDL goal < 70   Lipid Panel  Labs (Brief)$RemoveBefo'[]'NItSpEFJLrh$ Expand by Default           Component Value Date/Time    CHOL 172 01/03/2019 1122    TRIG (H) 01/03/2019 1122      521.0 Triglyceride is over 400; calculations on Lipids are invalid.    HDL 31.40 (L) 01/03/2019 1122    LDLCALC 43 04/24/2009 0000    LDLDIRECT 82.0 01/03/2019 1122      Hepatic Function Latest Ref Rng & Units 12/18/2019 12/11/2019 12/03/2019  Total Protein 6.5 - 8.1 g/dL 6.2(L) 6.6 6.5  Albumin 3.5 - 5.0 g/dL 2.7(L) 2.8(L) 2.7(L)  AST 15 - 41 U/L 8(L) 9(L) 8(L)  ALT 0 - 44 U/L $Remo'8 7 7  'JmbMz$ Alk Phosphatase 38 - 126 U/L 124 103 98  Total Bilirubin 0.3 - 1.2 mg/dL 0.5 0.4 0.3  Bilirubin, Direct 0.0 - 0.3 mg/dL - - -      The 10-year ASCVD risk score Mikey Bussing DC Jr., et al., 2013)  is: 60.2%   Values used to calculate the score:     Age: 31 years     Sex: Male     Is Non-Hispanic African American: No     Diabetic: Yes     Tobacco smoker: No     Systolic Blood Pressure: 152 mmHg     Is BP treated: Yes     HDL Cholesterol: 31.4 mg/dL     Total Cholesterol: 172 mg/dL    Patient has failed these meds in past: n/a Patient is currently uncontrolled on the following medications:   Simvastatin 40 mg daily in the evening  Aspirin 81 mg daily    We discussed:  diet and exercise extensively. Discuss at next visit about possible benefit of changing therapy to high intensity statin (atorvastatin or rosuvastatin).    Plan   Continue current medications    Memory Loss    Patient has failed these meds in past: n/a Patient is currently controlled on the  following medications:   Donepezil 10 mg daily at bedtime  Memantine 10 mg bid   We discussed:  Patient reports that his mom has alzheimer's disease and lives with him. His late wife was concerned with his memory issues and he was started on these medications. He denies any side effects with therapy and states his memory is good.    Plan   Continue current medications   GERD    Patient has failed these meds in past: n/a  Patient is currently controlled on the following medications:   Omeprazole 20 mg daily   We discussed: Patient endorses symptoms of acid reflux for years. Discussed benefits of elevating head of bed at night, small meals, avoiding tight waistbands. Recommended patient consider eating small meals throughout the day to control symptoms.    Plan   Continue current medications   Health Maintenance    Patient is currently controlled on the following medications:   Acetaminophen 650 mg CR every 8 hours as needed - pain  Prochlorperazine 10 mg q6 hours prn nausea  Venlafaxine XR 75 mg every morning and 150 mg at bedtime - neuropathy/nerves  Vitamin C 1000 mg daily - supplementation  Vitamin D 1000 units daily - supplementation   Flonase nasal spray - 2 sprays in each nostril daily prn allergies or rhinitis  Singulair 10 mg daily at bedtime - allergies  Clear eyes complete 1 drop in both eyes daily prn - dry eyes  Ampicillin 500 mg daily - per dermatology for skin condition   We discussed:  Patient reports good control of symptoms at this time.    Plan   Continue current medications   Vaccines    Reviewed and discussed patient's vaccination history.         Immunization History  Administered Date(s) Administered  . Fluad Quad(high Dose 65+) 12/20/2018  . Influenza Split 12/29/2010  . Influenza Whole 01/27/2009, 02/05/2010  . Influenza, High Dose Seasonal PF 02/03/2017, 12/21/2018  . Influenza, Seasonal, Injecte, Preservative Fre 06/14/2012  .  Influenza,inj,Quad PF,6+ Mos 12/18/2012, 12/19/2013, 03/19/2015, 01/05/2016, 01/16/2018  . Influenza,inj,quad, With Preservative 01/10/2017  . PFIZER SARS-COV-2 Vaccination 06/15/2019, 07/18/2019  . Pneumococcal Conjugate-13 12/19/2013  . Pneumococcal Polysaccharide-23 10/25/2008, 08/26/2015  . Td 04/13/2007      Plan   Recommended patient receive annual flu vaccine in office.    Medication Management    Pt uses [facility] for all medications Uses pill box? Yes Pt endorses good compliance uses 2 week pill box.  We discussed: Patient does not have a problem with picking [facility]. He likes going there and does not see a benefit of packaging or delivery at this time.    Plan   Continue current medication management strategy       Follow up: CMA to reach out in 1 month to review blood sugar readings and status of patient assistance application. 2 month phone visit with Pharmacistt to review blood sugar readings/ updated labs and results of endocrinology visit.

## 2020-02-25 NOTE — Telephone Encounter (Signed)
Received call from [facility] with critical Hgb of 6.2.  Called pt & he  has already left.  He states that he feels that he needs blood.  Lab reported to [facility]. Will set up for blood. Pt will go to [facility]. [facility] Office tomorrow @ 9 am.

## 2020-02-26 ENCOUNTER — Other Ambulatory Visit: Payer: Self-pay | Admitting: *Deleted

## 2020-02-26 ENCOUNTER — Inpatient Hospital Stay: Payer: Medicare Other

## 2020-02-26 VITALS — BP 118/73 | HR 99 | Temp 98.2°F | Resp 18

## 2020-02-26 DIAGNOSIS — D649 Anemia, unspecified: Secondary | ICD-10-CM

## 2020-02-26 DIAGNOSIS — C3491 Malignant neoplasm of unspecified part of right bronchus or lung: Secondary | ICD-10-CM

## 2020-02-26 DIAGNOSIS — E119 Type 2 diabetes mellitus without complications: Secondary | ICD-10-CM | POA: Diagnosis not present

## 2020-02-26 DIAGNOSIS — Z5111 Encounter for antineoplastic chemotherapy: Secondary | ICD-10-CM | POA: Diagnosis not present

## 2020-02-26 DIAGNOSIS — R197 Diarrhea, unspecified: Secondary | ICD-10-CM

## 2020-02-26 DIAGNOSIS — D6481 Anemia due to antineoplastic chemotherapy: Secondary | ICD-10-CM | POA: Diagnosis not present

## 2020-02-26 DIAGNOSIS — C3431 Malignant neoplasm of lower lobe, right bronchus or lung: Secondary | ICD-10-CM | POA: Diagnosis not present

## 2020-02-26 MED ORDER — DIPHENOXYLATE-ATROPINE 2.5-0.025 MG PO TABS: 1.0000 | ORAL_TABLET | Freq: Once | ORAL | Status: DC

## 2020-02-26 MED ORDER — DIPHENOXYLATE-ATROPINE 2.5-0.025 MG PO TABS
1.0000 | ORAL_TABLET | Freq: Once | ORAL | Status: AC
  Administered 2020-02-26: 1 via ORAL

## 2020-02-26 MED ORDER — SODIUM CHLORIDE 0.9% FLUSH
10.0000 mL | INTRAVENOUS | Status: AC | PRN
  Administered 2020-02-26: 10 mL
  Filled 2020-02-26: qty 10

## 2020-02-26 MED ORDER — HEPARIN SOD (PORK) LOCK FLUSH 100 UNIT/ML IV SOLN
500.0000 [IU] | Freq: Every day | INTRAVENOUS | Status: AC | PRN
  Administered 2020-02-26: 500 [IU]
  Filled 2020-02-26: qty 5

## 2020-02-26 MED ORDER — DIPHENHYDRAMINE HCL 25 MG PO CAPS
ORAL_CAPSULE | ORAL | Status: AC
  Filled 2020-02-26: qty 1

## 2020-02-26 MED ORDER — SODIUM CHLORIDE 0.9% IV SOLUTION
250.0000 mL | Freq: Once | INTRAVENOUS | Status: AC
  Administered 2020-02-26: 250 mL via INTRAVENOUS
  Filled 2020-02-26: qty 250

## 2020-02-26 MED ORDER — ACETAMINOPHEN 325 MG PO TABS
ORAL_TABLET | ORAL | Status: AC
  Filled 2020-02-26: qty 2

## 2020-02-26 MED ORDER — DIPHENHYDRAMINE HCL 25 MG PO CAPS
25.0000 mg | ORAL_CAPSULE | Freq: Once | ORAL | Status: AC
  Administered 2020-02-26: 25 mg via ORAL

## 2020-02-26 MED ORDER — DIPHENOXYLATE-ATROPINE 2.5-0.025 MG PO TABS
ORAL_TABLET | ORAL | Status: AC
  Filled 2020-02-26: qty 1

## 2020-02-26 MED ORDER — ACETAMINOPHEN 325 MG PO TABS
650.0000 mg | ORAL_TABLET | Freq: Once | ORAL | Status: AC
  Administered 2020-02-26: 650 mg via ORAL

## 2020-02-26 NOTE — Patient Instructions (Signed)

## 2020-02-26 NOTE — Progress Notes (Signed)
Pt discharged in no apparent distress. Pt left via wheelchair, taken to lobby. Pt aware of discharge instructions and verbalized understanding and had no further questions.

## 2020-02-27 ENCOUNTER — Other Ambulatory Visit: Payer: Self-pay

## 2020-02-27 ENCOUNTER — Encounter: Payer: Medicare Other | Admitting: Internal Medicine

## 2020-02-27 DIAGNOSIS — R000 Tachycardia, unspecified: Secondary | ICD-10-CM

## 2020-02-27 DIAGNOSIS — Z794 Long term (current) use of insulin: Secondary | ICD-10-CM | POA: Diagnosis not present

## 2020-02-27 DIAGNOSIS — Z87891 Personal history of nicotine dependence: Secondary | ICD-10-CM

## 2020-02-27 DIAGNOSIS — C3490 Malignant neoplasm of unspecified part of unspecified bronchus or lung: Secondary | ICD-10-CM | POA: Diagnosis not present

## 2020-02-27 DIAGNOSIS — J45909 Unspecified asthma, uncomplicated: Secondary | ICD-10-CM

## 2020-02-27 DIAGNOSIS — E1122 Type 2 diabetes mellitus with diabetic chronic kidney disease: Secondary | ICD-10-CM

## 2020-02-27 DIAGNOSIS — E11621 Type 2 diabetes mellitus with foot ulcer: Secondary | ICD-10-CM | POA: Diagnosis not present

## 2020-02-27 DIAGNOSIS — N1831 Chronic kidney disease, stage 3a: Secondary | ICD-10-CM

## 2020-02-27 DIAGNOSIS — E1142 Type 2 diabetes mellitus with diabetic polyneuropathy: Secondary | ICD-10-CM | POA: Diagnosis not present

## 2020-02-27 DIAGNOSIS — E1165 Type 2 diabetes mellitus with hyperglycemia: Secondary | ICD-10-CM | POA: Diagnosis not present

## 2020-02-27 DIAGNOSIS — G4733 Obstructive sleep apnea (adult) (pediatric): Secondary | ICD-10-CM

## 2020-02-27 DIAGNOSIS — L89899 Pressure ulcer of other site, unspecified stage: Secondary | ICD-10-CM | POA: Diagnosis not present

## 2020-02-27 DIAGNOSIS — C3491 Malignant neoplasm of unspecified part of right bronchus or lung: Secondary | ICD-10-CM | POA: Diagnosis not present

## 2020-02-27 DIAGNOSIS — M21372 Foot drop, left foot: Secondary | ICD-10-CM | POA: Diagnosis not present

## 2020-02-27 DIAGNOSIS — I129 Hypertensive chronic kidney disease with stage 1 through stage 4 chronic kidney disease, or unspecified chronic kidney disease: Secondary | ICD-10-CM

## 2020-02-27 DIAGNOSIS — S2232XD Fracture of one rib, left side, subsequent encounter for fracture with routine healing: Secondary | ICD-10-CM

## 2020-02-27 DIAGNOSIS — E041 Nontoxic single thyroid nodule: Secondary | ICD-10-CM

## 2020-02-27 DIAGNOSIS — Z9181 History of falling: Secondary | ICD-10-CM

## 2020-02-27 DIAGNOSIS — L97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scle: Secondary | ICD-10-CM | POA: Diagnosis not present

## 2020-02-27 DIAGNOSIS — L97512 Non-pressure chronic ulcer of other part of right foot with fat layer exposed: Secondary | ICD-10-CM | POA: Diagnosis not present

## 2020-02-27 DIAGNOSIS — L97429 Non-pressure chronic ulcer of left heel and midfoot with unspecified severity: Secondary | ICD-10-CM | POA: Diagnosis not present

## 2020-02-27 LAB — BPAM RBC
Blood Product Expiration Date: 202112032359
Blood Product Expiration Date: 202112032359
ISSUE DATE / TIME: 202111160759
ISSUE DATE / TIME: 202111160759
Unit Type and Rh: 6200
Unit Type and Rh: 6200

## 2020-02-27 LAB — TYPE AND SCREEN
ABO/RH(D): A POS
Antibody Screen: NEGATIVE
Unit division: 0
Unit division: 0

## 2020-02-29 ENCOUNTER — Telehealth: Payer: Self-pay | Admitting: *Deleted

## 2020-02-29 DIAGNOSIS — L97429 Non-pressure chronic ulcer of left heel and midfoot with unspecified severity: Secondary | ICD-10-CM | POA: Diagnosis not present

## 2020-02-29 DIAGNOSIS — E11621 Type 2 diabetes mellitus with foot ulcer: Secondary | ICD-10-CM | POA: Diagnosis not present

## 2020-02-29 DIAGNOSIS — I129 Hypertensive chronic kidney disease with stage 1 through stage 4 chronic kidney disease, or unspecified chronic kidney disease: Secondary | ICD-10-CM | POA: Diagnosis not present

## 2020-02-29 DIAGNOSIS — N1831 Chronic kidney disease, stage 3a: Secondary | ICD-10-CM | POA: Diagnosis not present

## 2020-02-29 DIAGNOSIS — E1165 Type 2 diabetes mellitus with hyperglycemia: Secondary | ICD-10-CM | POA: Diagnosis not present

## 2020-02-29 DIAGNOSIS — E041 Nontoxic single thyroid nodule: Secondary | ICD-10-CM | POA: Diagnosis not present

## 2020-02-29 DIAGNOSIS — Z87891 Personal history of nicotine dependence: Secondary | ICD-10-CM | POA: Diagnosis not present

## 2020-02-29 DIAGNOSIS — S2232XD Fracture of one rib, left side, subsequent encounter for fracture with routine healing: Secondary | ICD-10-CM | POA: Diagnosis not present

## 2020-02-29 DIAGNOSIS — R000 Tachycardia, unspecified: Secondary | ICD-10-CM | POA: Diagnosis not present

## 2020-02-29 DIAGNOSIS — G4733 Obstructive sleep apnea (adult) (pediatric): Secondary | ICD-10-CM | POA: Diagnosis not present

## 2020-02-29 DIAGNOSIS — Z794 Long term (current) use of insulin: Secondary | ICD-10-CM | POA: Diagnosis not present

## 2020-02-29 DIAGNOSIS — Z9181 History of falling: Secondary | ICD-10-CM | POA: Diagnosis not present

## 2020-02-29 DIAGNOSIS — C3491 Malignant neoplasm of unspecified part of right bronchus or lung: Secondary | ICD-10-CM | POA: Diagnosis not present

## 2020-02-29 DIAGNOSIS — J45909 Unspecified asthma, uncomplicated: Secondary | ICD-10-CM | POA: Diagnosis not present

## 2020-02-29 DIAGNOSIS — E1142 Type 2 diabetes mellitus with diabetic polyneuropathy: Secondary | ICD-10-CM | POA: Diagnosis not present

## 2020-02-29 DIAGNOSIS — E1122 Type 2 diabetes mellitus with diabetic chronic kidney disease: Secondary | ICD-10-CM | POA: Diagnosis not present

## 2020-02-29 NOTE — Telephone Encounter (Signed)
I will check on Monday.  I will likely cut his BP meds - lost a lot of weight with cancer and cancer treatments.

## 2020-02-29 NOTE — Telephone Encounter (Signed)
[facility] - Client TELEPHONE ADVICE RECORD [facility] Patient Name: [name] Gender: Male DOB: 13-Jan-1946 Age: 74 Y 15 M 22 D Return Phone Number: [phone] (Primary) Address: City/State/Zip: [place] [address] Client [facility] - Client Client Site [place] - Day Physician Copland, Frederico Hamman - MD Contact Type Call Who Is Calling Patient / Member / Family / Caregiver Call Type Triage / Clinical Relationship To Patient Self Return Phone Number [phone] (Primary) Chief Complaint Blood Pressure Low Reason for Call Symptomatic / Request for Health Information Initial Comment 1/2 Caller states he has blood pressure of 160/81. Translation No Nurse Assessment Nurse: Thad Ranger, RN, Denise Date/Time (Eastern Time): 02/29/2020 11:30:42 AM Confirm and document reason for call. If symptomatic, describe symptoms. ---1/2 Caller states he has blood pressure of 160/81. States his BP dropped on Wed, seen in [facility] and has been holding the BP med. Atenolol 50mg  po qd, Lisinopril 10mg  po qd. Takes both together in the am's. Does the patient have any new or worsening symptoms? ---Yes Will a triage be completed? ---Yes Related visit to physician within the last 2 weeks? ---Yes Does the PT have any chronic conditions? (i.e. diabetes, asthma, this includes High risk factors for pregnancy, etc.) ---Yes List chronic conditions. ---HTN, Kidney dx Is this a behavioral health or substance abuse call? ---No Guidelines Guideline Title Affirmed Question Affirmed Notes Nurse Date/Time (Eastern Time) Blood Pressure - High Systolic BP >= 546 OR Diastolic >= 270 Carmon, RN, Denise 02/29/2020 11:32:38 AM Disp. Time Eilene Ghazi Time) Disposition Final User 02/29/2020 11:39:02 AM SEE PCP WITHIN 3 DAYS Yes Carmon, RN, Yevette Edwards Disagree/Comply Comply Caller Understands Yes PreDisposition Call Doctor PLEASE NOTE: All  timestamps contained within this report are represented as [place] Standard Time. CONFIDENTIALTY NOTICE: This fax transmission is intended only for the addressee. It contains information that is legally privileged, confidential or otherwise protected from use or disclosure. If you are not the intended recipient, you are strictly prohibited from reviewing, disclosing, copying using or disseminating any of this information or taking any action in reliance on or regarding this information. If you have received this fax in error, please notify us immediately by telephone so that we can arrange for its return to [place]. Phone: [phone], Toll-Free: [phone], Fax: [phone] Page: 2 of 2 Call Id: [id number] Care Advice Given Per Guideline SEE PCP WITHIN 3 DAYS: CALL BACK IF: * Weakness or numbness of the face, arm or leg on one side of the body occurs * Difficulty walking, difficulty talking, or severe headache occurs * Chest pain or difficulty breathing occurs * Your blood pressure is over 180/110 * You become worse CARE ADVICE given per High Blood Pressure (Adult) guideline. Comments User: Romeo Apple, RN Date/Time Eilene Ghazi Time): 02/29/2020 11:38:01 AM Pt took himself off of the Atenolol and Lisinopril on Wed. Based on RN clinical knowledge, advised to resume the Lisinopril q am and Atenolol q pm. Advised to separate the meds so BP will not drop. Advised to ck BP q am before med adm, mid afternoon, and at hs. Document all readings including time/date and take this w/him to [facility]. User: Romeo Apple, RN Date/Time Eilene Ghazi Time): 02/29/2020 11:38:53 AM Based on RN clinical knowledge, advised if SBP < or = to 100, hold BP med, recheck BP in 1-2 hrs and if increased take med. If not, call [facility]. Referrals REFERRED TO PCP OFFICE

## 2020-02-29 NOTE — Telephone Encounter (Signed)
Patient scheduled for an ap\pointmetn on Monday with Dr, Lorelei Pont. Called to check on patient to make sure that he is okay to wait until Monday.  Patient stated that he had talked with Access Nurse earlier today. Patient stated that he passed out Wednesday in the parking lot at the [facility]. Patient stated that they took him in the building and evaluated him. Patient stated that this is the second time this has happened to him recently. Patient stated that his BP dropped to 106/52. Patient stated that he has not taken his blood pressure medication since Wednesday. Patient stated that his blood pressure today was 156/81. Patent stated that he feels just fine. Patient denies chest pain, dizziness or any other symptoms. Patient was given ER precautions and he verbalized understanding. Patient stated that the Access Nurse told him that he may want to spit his blood pressure medication up and take some in the morning and the other in the evening.

## 2020-03-02 DIAGNOSIS — I129 Hypertensive chronic kidney disease with stage 1 through stage 4 chronic kidney disease, or unspecified chronic kidney disease: Secondary | ICD-10-CM | POA: Diagnosis not present

## 2020-03-02 DIAGNOSIS — E1122 Type 2 diabetes mellitus with diabetic chronic kidney disease: Secondary | ICD-10-CM | POA: Diagnosis not present

## 2020-03-02 DIAGNOSIS — Z87891 Personal history of nicotine dependence: Secondary | ICD-10-CM | POA: Diagnosis not present

## 2020-03-02 DIAGNOSIS — L97429 Non-pressure chronic ulcer of left heel and midfoot with unspecified severity: Secondary | ICD-10-CM | POA: Diagnosis not present

## 2020-03-02 DIAGNOSIS — E1165 Type 2 diabetes mellitus with hyperglycemia: Secondary | ICD-10-CM | POA: Diagnosis not present

## 2020-03-02 DIAGNOSIS — E11621 Type 2 diabetes mellitus with foot ulcer: Secondary | ICD-10-CM | POA: Diagnosis not present

## 2020-03-02 DIAGNOSIS — Z794 Long term (current) use of insulin: Secondary | ICD-10-CM | POA: Diagnosis not present

## 2020-03-02 DIAGNOSIS — G4733 Obstructive sleep apnea (adult) (pediatric): Secondary | ICD-10-CM | POA: Diagnosis not present

## 2020-03-02 DIAGNOSIS — Z9181 History of falling: Secondary | ICD-10-CM | POA: Diagnosis not present

## 2020-03-02 DIAGNOSIS — S2232XD Fracture of one rib, left side, subsequent encounter for fracture with routine healing: Secondary | ICD-10-CM | POA: Diagnosis not present

## 2020-03-02 DIAGNOSIS — J45909 Unspecified asthma, uncomplicated: Secondary | ICD-10-CM | POA: Diagnosis not present

## 2020-03-02 DIAGNOSIS — R000 Tachycardia, unspecified: Secondary | ICD-10-CM | POA: Diagnosis not present

## 2020-03-02 DIAGNOSIS — N1831 Chronic kidney disease, stage 3a: Secondary | ICD-10-CM | POA: Diagnosis not present

## 2020-03-02 DIAGNOSIS — C3491 Malignant neoplasm of unspecified part of right bronchus or lung: Secondary | ICD-10-CM | POA: Diagnosis not present

## 2020-03-02 DIAGNOSIS — E041 Nontoxic single thyroid nodule: Secondary | ICD-10-CM | POA: Diagnosis not present

## 2020-03-02 DIAGNOSIS — E1142 Type 2 diabetes mellitus with diabetic polyneuropathy: Secondary | ICD-10-CM | POA: Diagnosis not present

## 2020-03-02 NOTE — Progress Notes (Signed)
[name] T. Dustin Tidd, MD, [place]  [facility] and [place] at [facility] [place], [address]  Phone: [phone]  FAX: [phone]  [name] - 74 y.o. male  [name] [id number]  Date of Birth: [birth year]  Date: 03/03/2020  PCP: [name], MD  Referral: [name], MD  Chief Complaint  Patient presents with  . Loss of Consciousness    This visit occurred during the SARS-CoV-2 public health emergency.  Safety protocols were in place, including screening questions prior to the visit, additional usage of staff PPE, and extensive cleaning of exam room while observing appropriate contact time as indicated for disinfecting solutions.   Subjective:   [name] is a 74 y.o. very pleasant male patient with Body mass index is 26.01 kg/m. who presents with the following:  [name] is a well-known patient with lung cancer with severe diabetes who presents in follow-up for some lightheadedness and passing out last week.  He was at the wound center, and his BP was low.  He also spoke to Access Nurse, and he d/c some of his BP medication.  He has also been having some chemotherapy-induced anemia.  He very recently just had a blood transfusion, today his hemoglobin is up to 9.3.  Taking Atenolol and lisinopril now and taking AM and PM. He has been taking his atenolol and was started on this for some intermittent SVT.  CBC Latest Ref Rng & Units 03/03/2020 02/25/2020 02/18/2020  WBC 4.0 - 10.5 K/uL 10.9(H) 8.1 11.7(H)  Hemoglobin 13.0 - 17.0 g/dL 9.3(L) 6.2(LL) 7.8(L)  Hematocrit 39 - 52 % 27.3(L) 18.3(L) 22.1(L)  Platelets 150 - 400 K/uL 138(L) 43(L) 90(L)    Review of Systems is noted in the HPI, as appropriate  Objective:   BP (!) 90/50   Pulse 74   Temp (!) 96.7 F (35.9 C) (Temporal)   Ht 5\' 11"  (1.803 m)   Wt 186 lb 8 oz (84.6 kg)   SpO2 100%   BMI 26.01 kg/m   Orthostatic VS for  the past 24 hrs:  BP- Lying Pulse- Lying BP- Sitting Pulse- Sitting BP- Standing at 0 minutes Pulse- Standing at 0 minutes  03/03/20 1053 134/83 101 111/62 102 (!) 75/46 101      GEN: No acute distress; alert,appropriate. PULM: Breathing comfortably in no respiratory distress PSYCH: Normally interactive.  CV: RRR, no m/g/r   Laboratory and Imaging Data:  Assessment and Plan:     ICD-10-CM   1. Orthostatic hypertension  I10   2. Non-small cell carcinoma of right lung, stage 3 (HCC)  C34.91   3. Essential hypertension  I10   4. Uncontrolled type 2 diabetes mellitus with peripheral neuropathy (HCC)  E11.42    E11.65   5. Anemia associated with chemotherapy  D64.81    T45.1X5A    I think that this is a combination of his anemia secondary to chemotherapy, and he also is very very hypotensive secondary to orthostasis.  I am going to have him stop both his atenolol and his low-dose lisinopril.  I worry that the potential risks of hypotension outweighed the potential benefits.  At this point he is of significant risk for falling and have been a major fracture head injury.  Social Determinants of Health   Physical Activity:  . Days of Exercise per Week:  Limited, DM, cancer, wounds . Feeling of Stress :  Wife died last year, mom not doing  well - in 90's   No orders of the defined types were placed in this encounter.  Medications Discontinued During This Encounter  Medication Reason  . levofloxacin (LEVAQUIN) 500 MG tablet Completed Course  . predniSONE (DELTASONE) 10 MG tablet Completed Course  . insulin regular human CONCENTRATED (HUMULIN R U-500 KWIKPEN) 500 UNIT/ML kwikpen Duplicate  . atenolol (TENORMIN) 50 MG tablet   . lisinopril (ZESTRIL) 10 MG tablet    No orders of the defined types were placed in this encounter.   Follow-up: Return for 2-3 weeks for blood pressure.  Signed,  Maud Deed. Adonia Porada, MD   Outpatient Encounter Medications as of 03/03/2020  Medication  Sig  . acetaminophen (QC ACETAMINOPHEN 8HR ARTH PAIN) 650 MG CR tablet Take 650 mg by mouth every 8 (eight) hours as needed for pain.  [name] Kitchen albuterol (VENTOLIN HFA) 108 (90 Base) MCG/ACT inhaler Inhale 1-2 puffs into the lungs every 6 (six) hours as needed for wheezing or shortness of breath.  [name] Kitchen ampicillin (PRINCIPEN) 500 MG capsule Take 500 mg by mouth daily as needed (flare up).   . Ascorbic Acid (VITAMIN C) 1000 MG tablet Take 1,000 mg by mouth daily.  [name] Kitchen aspirin EC 81 MG tablet Take 81 mg by mouth daily. Swallow whole.  . cholecalciferol (VITAMIN D) 1000 UNITS tablet Take 2,000 Units by mouth daily.   [name] Kitchen donepezil (ARICEPT) 10 MG tablet TAKE 1 TABLET BY MOUTH AT BEDTIME  . fluticasone (FLONASE) 50 MCG/ACT nasal spray PLACE 2 SPRAYS IN EACH NOSTRIL DAILY  . Hyprom-Naphaz-Polysorb-Zn Sulf (CLEAR EYES COMPLETE OP) Place 1 drop into both eyes daily as needed (dry eyes).  . Insulin Pen Needle (PEN NEEDLES) 31G X 5 MM MISC 1 each by Does not apply route 3 (three) times daily. To inject insulin E11.42  . insulin regular human CONCENTRATED (HUMULIN R U-500 KWIKPEN) 500 UNIT/ML kwikpen Inject 40 Units into the skin with breakfast, with lunch, and with evening meal.  . memantine (NAMENDA) 10 MG tablet TAKE 1 TABLET BY MOUTH TWICE A DAY  . montelukast (SINGULAIR) 10 MG tablet Take 1 tablet (10 mg total) by mouth at bedtime.  . pantoprazole (PROTONIX) 20 MG tablet Take 1 tablet (20 mg total) by mouth daily.  . potassium chloride SA (KLOR-CON) 20 MEQ tablet Take 1 tablet (20 mEq total) by mouth 2 (two) times daily.  . prochlorperazine (COMPAZINE) 10 MG tablet Take 1 tablet (10 mg total) by mouth every 6 (six) hours as needed.  . simvastatin (ZOCOR) 40 MG tablet TAKE 1 TABLET BY MOUTH AT BEDTIME  . sucralfate (CARAFATE) 1 g tablet Take 1 tablet (1 g total) by mouth 4 (four) times daily. Dissolve each tablet in 15 cc water before use.  . venlafaxine XR (EFFEXOR-XR) 75 MG 24 hr capsule TAKE 1 CAPSULE BY MOUTH  EVERY MORNING AND 2 CAPSULES AT BEDTIME  . [DISCONTINUED] atenolol (TENORMIN) 50 MG tablet Take 50 mg by mouth daily.  . [DISCONTINUED] lisinopril (ZESTRIL) 10 MG tablet Take 10 mg by mouth daily.  . [DISCONTINUED] insulin regular human CONCENTRATED (HUMULIN R U-500 KWIKPEN) 500 UNIT/ML kwikpen Inject under skin 80-90 units before b'fast, 60 units before lunch, 80-90 units vefore dinner (Patient taking differently: Inject 20-40 Units into the skin See admin instructions. Takes 35-40 units before breakfast, 20 units at lunch and 35-40 units at night)  . [DISCONTINUED] levofloxacin (LEVAQUIN) 500 MG tablet Take 1 tablet (500 mg total) by mouth daily.  . [DISCONTINUED] predniSONE (DELTASONE) 10 MG tablet 3 tabs by  mouth daily x 3 days, then 2 tabs by mouth daily x 2 days then 1 tab by mouth daily x 2 days   No facility-administered encounter medications on file as of 03/03/2020.

## 2020-03-03 ENCOUNTER — Inpatient Hospital Stay (HOSPITAL_BASED_OUTPATIENT_CLINIC_OR_DEPARTMENT_OTHER): Payer: Medicare Other | Admitting: Internal Medicine

## 2020-03-03 ENCOUNTER — Ambulatory Visit (INDEPENDENT_AMBULATORY_CARE_PROVIDER_SITE_OTHER): Payer: Medicare Other | Admitting: Family Medicine

## 2020-03-03 ENCOUNTER — Encounter: Payer: Self-pay | Admitting: Family Medicine

## 2020-03-03 ENCOUNTER — Inpatient Hospital Stay: Payer: Medicare Other

## 2020-03-03 ENCOUNTER — Encounter: Payer: Self-pay | Admitting: Internal Medicine

## 2020-03-03 ENCOUNTER — Other Ambulatory Visit: Payer: Self-pay

## 2020-03-03 VITALS — BP 117/67 | HR 100 | Temp 98.7°F | Resp 17 | Ht 71.0 in | Wt 185.7 lb

## 2020-03-03 VITALS — BP 90/50 | HR 74 | Temp 96.7°F | Ht 71.0 in | Wt 186.5 lb

## 2020-03-03 DIAGNOSIS — C3490 Malignant neoplasm of unspecified part of unspecified bronchus or lung: Secondary | ICD-10-CM

## 2020-03-03 DIAGNOSIS — Z5111 Encounter for antineoplastic chemotherapy: Secondary | ICD-10-CM | POA: Diagnosis not present

## 2020-03-03 DIAGNOSIS — I10 Essential (primary) hypertension: Secondary | ICD-10-CM | POA: Diagnosis not present

## 2020-03-03 DIAGNOSIS — T451X5A Adverse effect of antineoplastic and immunosuppressive drugs, initial encounter: Secondary | ICD-10-CM

## 2020-03-03 DIAGNOSIS — E1165 Type 2 diabetes mellitus with hyperglycemia: Secondary | ICD-10-CM | POA: Diagnosis not present

## 2020-03-03 DIAGNOSIS — C3491 Malignant neoplasm of unspecified part of right bronchus or lung: Secondary | ICD-10-CM | POA: Diagnosis not present

## 2020-03-03 DIAGNOSIS — E119 Type 2 diabetes mellitus without complications: Secondary | ICD-10-CM | POA: Diagnosis not present

## 2020-03-03 DIAGNOSIS — D6481 Anemia due to antineoplastic chemotherapy: Secondary | ICD-10-CM

## 2020-03-03 DIAGNOSIS — R739 Hyperglycemia, unspecified: Secondary | ICD-10-CM

## 2020-03-03 DIAGNOSIS — E1142 Type 2 diabetes mellitus with diabetic polyneuropathy: Secondary | ICD-10-CM | POA: Diagnosis not present

## 2020-03-03 DIAGNOSIS — C3431 Malignant neoplasm of lower lobe, right bronchus or lung: Secondary | ICD-10-CM | POA: Diagnosis not present

## 2020-03-03 DIAGNOSIS — IMO0002 Reserved for concepts with insufficient information to code with codable children: Secondary | ICD-10-CM

## 2020-03-03 LAB — CBC WITH DIFFERENTIAL (CANCER CENTER ONLY)
Abs Immature Granulocytes: 0.15 10*3/uL — ABNORMAL HIGH (ref 0.00–0.07)
Basophils Absolute: 0 10*3/uL (ref 0.0–0.1)
Basophils Relative: 0 %
Eosinophils Absolute: 0 10*3/uL (ref 0.0–0.5)
Eosinophils Relative: 0 %
HCT: 27.3 % — ABNORMAL LOW (ref 39.0–52.0)
Hemoglobin: 9.3 g/dL — ABNORMAL LOW (ref 13.0–17.0)
Immature Granulocytes: 1 %
Lymphocytes Relative: 4 %
Lymphs Abs: 0.4 10*3/uL — ABNORMAL LOW (ref 0.7–4.0)
MCH: 29.9 pg (ref 26.0–34.0)
MCHC: 34.1 g/dL (ref 30.0–36.0)
MCV: 87.8 fL (ref 80.0–100.0)
Monocytes Absolute: 0.8 10*3/uL (ref 0.1–1.0)
Monocytes Relative: 8 %
Neutro Abs: 9.5 10*3/uL — ABNORMAL HIGH (ref 1.7–7.7)
Neutrophils Relative %: 87 %
Platelet Count: 138 10*3/uL — ABNORMAL LOW (ref 150–400)
RBC: 3.11 MIL/uL — ABNORMAL LOW (ref 4.22–5.81)
RDW: 15.5 % (ref 11.5–15.5)
WBC Count: 10.9 10*3/uL — ABNORMAL HIGH (ref 4.0–10.5)
nRBC: 0 % (ref 0.0–0.2)

## 2020-03-03 LAB — CMP (CANCER CENTER ONLY)
ALT: 8 U/L (ref 0–44)
AST: 9 U/L — ABNORMAL LOW (ref 15–41)
Albumin: 2.5 g/dL — ABNORMAL LOW (ref 3.5–5.0)
Alkaline Phosphatase: 100 U/L (ref 38–126)
Anion gap: 9 (ref 5–15)
BUN: 17 mg/dL (ref 8–23)
CO2: 27 mmol/L (ref 22–32)
Calcium: 8.6 mg/dL — ABNORMAL LOW (ref 8.9–10.3)
Chloride: 94 mmol/L — ABNORMAL LOW (ref 98–111)
Creatinine: 1.29 mg/dL — ABNORMAL HIGH (ref 0.61–1.24)
GFR, Estimated: 58 mL/min — ABNORMAL LOW (ref >60)
Glucose, Bld: 328 mg/dL — ABNORMAL HIGH (ref 70–99)
Potassium: 4.3 mmol/L (ref 3.5–5.1)
Sodium: 130 mmol/L — ABNORMAL LOW (ref 135–145)
Total Bilirubin: 0.6 mg/dL (ref 0.3–1.2)
Total Protein: 6.4 g/dL — ABNORMAL LOW (ref 6.5–8.1)

## 2020-03-03 LAB — SAMPLE TO BLOOD BANK

## 2020-03-03 MED ORDER — SODIUM CHLORIDE 0.9 % IV SOLN
10.0000 mg | Freq: Once | INTRAVENOUS | Status: AC
  Administered 2020-03-03: 10 mg via INTRAVENOUS
  Filled 2020-03-03: qty 10

## 2020-03-03 MED ORDER — PALONOSETRON HCL INJECTION 0.25 MG/5ML
0.2500 mg | Freq: Once | INTRAVENOUS | Status: AC
  Administered 2020-03-03: 0.25 mg via INTRAVENOUS

## 2020-03-03 MED ORDER — SODIUM CHLORIDE 0.9% FLUSH
10.0000 mL | INTRAVENOUS | Status: DC | PRN
  Administered 2020-03-03: 10 mL
  Filled 2020-03-03: qty 10

## 2020-03-03 MED ORDER — SODIUM CHLORIDE 0.9 % IV SOLN
Freq: Once | INTRAVENOUS | Status: AC
  Filled 2020-03-03: qty 250

## 2020-03-03 MED ORDER — SODIUM CHLORIDE 0.9 % IV SOLN
80.0000 mg/m2 | Freq: Once | INTRAVENOUS | Status: AC
  Administered 2020-03-03: 170 mg via INTRAVENOUS
  Filled 2020-03-03: qty 8.5

## 2020-03-03 MED ORDER — HEPARIN SOD (PORK) LOCK FLUSH 100 UNIT/ML IV SOLN
500.0000 [IU] | Freq: Once | INTRAVENOUS | Status: AC | PRN
  Administered 2020-03-03: 500 [IU]
  Filled 2020-03-03: qty 5

## 2020-03-03 MED ORDER — INSULIN ASPART 100 UNIT/ML ~~LOC~~ SOLN
8.0000 [IU] | Freq: Once | SUBCUTANEOUS | Status: AC
  Administered 2020-03-03: 8 [IU] via SUBCUTANEOUS

## 2020-03-03 MED ORDER — SODIUM CHLORIDE 0.9 % IV SOLN
150.0000 mg | Freq: Once | INTRAVENOUS | Status: AC
  Administered 2020-03-03: 150 mg via INTRAVENOUS
  Filled 2020-03-03: qty 150

## 2020-03-03 MED ORDER — PALONOSETRON HCL INJECTION 0.25 MG/5ML
INTRAVENOUS | Status: AC
  Filled 2020-03-03: qty 5

## 2020-03-03 MED ORDER — SODIUM CHLORIDE 0.9 % IV SOLN
330.0000 mg | Freq: Once | INTRAVENOUS | Status: AC
  Administered 2020-03-03: 330 mg via INTRAVENOUS
  Filled 2020-03-03: qty 33

## 2020-03-03 MED ORDER — INSULIN ASPART 100 UNIT/ML ~~LOC~~ SOLN
SUBCUTANEOUS | Status: AC
  Filled 2020-03-03: qty 1

## 2020-03-03 NOTE — Progress Notes (Signed)
Blood sugar today reported out as 328.  Received orders to give Novolog 8 units subcutaneous x 1 while in clinic.  T.O. Dr [name]/Clorine Swing Ronnald Ramp, PharmD

## 2020-03-03 NOTE — Progress Notes (Signed)
[place] [phone]   [phone]  OFFICE PROGRESS NOTE  [name], MD [place] [address]  DIAGNOSIS: Limited stage small cell lung cancer.He presented with a large right lower lobe/right hilar mass with obstruction of the right lower lobe bronchus. The patient also has associated bulky right lower paratracheal and subcarinal adenopathy.He was diagnosed in July 2021.  PRIOR THERAPY: 1)weekly concurrent chemoradiation with carboplatin for an AUC of 2 and paclitaxel 45 mg/m.First dose 10/29/2019. Status post 1 cycle. Discontinued due to change in diagnosis.  CURRENT THERAPY: Systemic chemotherapy with carboplatin for an AUC of 5 on day 1 and etoposide 100 mg per metered squared on days 1, 2, and 3 IV every 3 weeks with Neulasta support. First dose expected on 11/12/2019. Status post 5 cycles.  This was concurrent with radiation under the care of Dr. Lisbeth Renshaw for the obstructive lung mass.   INTERVAL HISTORY: [name] 74 y.o. male returns to the clinic today for follow-up visit.  The patient is feeling fine today with no concerning complaints except for the fatigue and shortness of breath with exertion.  He received 2 units of PRBCs transfusion last week because of the chemotherapy-induced anemia.  He was seen by his primary care physician earlier today and he discontinued all his blood pressure medication because of orthostatic hypotension.  The patient denied having any current chest pain, cough or hemoptysis.  He denied having any fever or chills.  He has no nausea, vomiting, diarrhea or constipation.  He has no headache or visual changes.  He is here today for evaluation before starting cycle #6.  MEDICAL HISTORY: Past Medical History:  Diagnosis Date  . Allergic rhinitis due to pollen   . Chronic kidney disease, stage III (moderate) ([place]) 12/19/2012  . Depression   . Diabetes mellitus ([place])   .  Diverticulosis   . Erectile dysfunction associated with type 2 diabetes mellitus ([place]) 05/28/2015  . Former very heavy cigarette smoker (more than 40 per day) 10/07/2014  . GERD (gastroesophageal reflux disease)   . Gout   . Hyperlipidemia   . Hypertension   . Hypogonadism male 06/14/2012  . Memory loss   . Non-small cell carcinoma of right lung, stage 3 ([place]) 10/23/2019  . Osteoarthritis   . Personal history of colonic polyps    1996    ALLERGIES:  is allergic to tetracycline.  MEDICATIONS:  Current Outpatient Medications  Medication Sig Dispense Refill  . acetaminophen (QC ACETAMINOPHEN 8HR ARTH PAIN) 650 MG CR tablet Take 650 mg by mouth every 8 (eight) hours as needed for pain.    [name] Kitchen albuterol (VENTOLIN HFA) 108 (90 Base) MCG/ACT inhaler Inhale 1-2 puffs into the lungs every 6 (six) hours as needed for wheezing or shortness of breath. 8 g 2  . ampicillin (PRINCIPEN) 500 MG capsule Take 500 mg by mouth daily as needed (flare up).     . Ascorbic Acid (VITAMIN C) 1000 MG tablet Take 1,000 mg by mouth daily.    [name] Kitchen aspirin EC 81 MG tablet Take 81 mg by mouth daily. Swallow whole.    . cholecalciferol (VITAMIN D) 1000 UNITS tablet Take 2,000 Units by mouth daily.     [name] Kitchen donepezil (ARICEPT) 10 MG tablet TAKE 1 TABLET BY MOUTH AT BEDTIME 90 tablet 3  . fluticasone (FLONASE) 50 MCG/ACT nasal spray PLACE 2 SPRAYS IN EACH NOSTRIL DAILY 48 mL 3  . Hyprom-Naphaz-Polysorb-Zn Sulf (CLEAR EYES  COMPLETE OP) Place 1 drop into both eyes daily as needed (dry eyes).    . Insulin Pen Needle (PEN NEEDLES) 31G X 5 MM MISC 1 each by Does not apply route 3 (three) times daily. To inject insulin E11.42 90 each 2  . insulin regular human CONCENTRATED (HUMULIN R U-500 KWIKPEN) 500 UNIT/ML kwikpen Inject 40 Units into the skin with breakfast, with lunch, and with evening meal.    . memantine (NAMENDA) 10 MG tablet TAKE 1 TABLET BY MOUTH TWICE A DAY 180 tablet 3  . montelukast (SINGULAIR) 10 MG tablet Take 1 tablet (10  mg total) by mouth at bedtime. 30 tablet 3  . pantoprazole (PROTONIX) 20 MG tablet Take 1 tablet (20 mg total) by mouth daily. 30 tablet 2  . potassium chloride SA (KLOR-CON) 20 MEQ tablet Take 1 tablet (20 mEq total) by mouth 2 (two) times daily. 14 tablet 0  . prochlorperazine (COMPAZINE) 10 MG tablet Take 1 tablet (10 mg total) by mouth every 6 (six) hours as needed. 30 tablet 2  . simvastatin (ZOCOR) 40 MG tablet TAKE 1 TABLET BY MOUTH AT BEDTIME 90 tablet 2  . sucralfate (CARAFATE) 1 g tablet Take 1 tablet (1 g total) by mouth 4 (four) times daily. Dissolve each tablet in 15 cc water before use. 120 tablet 2  . venlafaxine XR (EFFEXOR-XR) 75 MG 24 hr capsule TAKE 1 CAPSULE BY MOUTH EVERY MORNING AND 2 CAPSULES AT BEDTIME 270 capsule 1   No current facility-administered medications for this visit.   Facility-Administered Medications Ordered in Other Visits  Medication Dose Route Frequency Provider Last Rate Last Admin  . sodium chloride flush (NS) 0.9 % injection 10 mL  10 mL Intracatheter PRN Curt Bears, MD   10 mL at 03/03/20 1155    SURGICAL HISTORY:  Past Surgical History:  Procedure Laterality Date  . BRONCHIAL NEEDLE ASPIRATION BIOPSY  10/17/2019   Procedure: BRONCHIAL NEEDLE ASPIRATION BIOPSIES;  Surgeon: Juanito Doom, MD;  Location: [facility];  Service: Cardiopulmonary;;  . CARDIAC CATHETERIZATION  11/2011   [facility]  . CHOLECYSTECTOMY  1980  . ENDOBRONCHIAL ULTRASOUND Bilateral 10/17/2019   Procedure: ENDOBRONCHIAL ULTRASOUND;  Surgeon: Juanito Doom, MD;  Location: [facility];  Service: Cardiopulmonary;  Laterality: Bilateral;  . ESOPHAGOGASTRODUODENOSCOPY (EGD) WITH PROPOFOL N/A 09/27/2017   Procedure: ESOPHAGOGASTRODUODENOSCOPY (EGD) WITH PROPOFOL;  Surgeon: Lin Landsman, MD;  Location: [facility];  Service: Gastroenterology;  Laterality: N/A;  . EYE SURGERY Right 07/29/2016   cataract - Dr. Manuella Ghazi  . EYE SURGERY Left 08/23/2106   cataract - Dr. Manuella Ghazi   . HEMOSTASIS CONTROL  10/17/2019   Procedure: HEMOSTASIS CONTROL;  Surgeon: Juanito Doom, MD;  Location: [facility];  Service: Cardiopulmonary;;  . IR IMAGING GUIDED PORT INSERTION  11/30/2019  . IRRIGATION AND DEBRIDEMENT ABSCESS Left 11/23/2019   Procedure: IRRIGATION AND DEBRIDEMENT ABSCESS;  Surgeon: Melina Schools, MD;  Location: [facility];  Service: Orthopedics;  Laterality: Left;  . TOTAL HIP ARTHROPLASTY  2009   Dr. Gladstone Lighter  . TOTAL HIP ARTHROPLASTY      REVIEW OF SYSTEMS:  A comprehensive review of systems was negative except for: Constitutional: positive for fatigue Respiratory: positive for dyspnea on exertion   PHYSICAL EXAMINATION: General appearance: alert, cooperative, fatigued and no distress Head: Normocephalic, without obvious abnormality, atraumatic Neck: no adenopathy, no JVD, supple, symmetrical, trachea midline and thyroid not enlarged, symmetric, no tenderness/mass/nodules Lymph nodes: Cervical, supraclavicular, and axillary nodes normal. Resp: clear to auscultation bilaterally Back: symmetric,  no curvature. ROM normal. No CVA tenderness. Cardio: regular rate and rhythm, S1, S2 normal, no murmur, click, rub or gallop GI: soft, non-tender; bowel sounds normal; no masses,  no organomegaly Extremities: extremities normal, atraumatic, no cyanosis or edema  ECOG PERFORMANCE STATUS: 1 - Symptomatic but completely ambulatory  Blood pressure 117/67, pulse 100, temperature 98.7 F (37.1 C), temperature source Tympanic, resp. rate 17, height 5\' 11"  (1.803 m), weight 185 lb 11.2 oz (84.2 kg), SpO2 98 %.  LABORATORY DATA: Lab Results  Component Value Date   WBC 10.9 (H) 03/03/2020   HGB 9.3 (L) 03/03/2020   HCT 27.3 (L) 03/03/2020   MCV 87.8 03/03/2020   PLT 138 (L) 03/03/2020      Chemistry      Component Value Date/Time   NA 135 02/25/2020 1200   NA 130 (L) 11/25/2011 1119   NA 137 11/16/2011 0513   K 3.4 (L) 02/25/2020 1200   K 4.1 11/16/2011 0513   CL  96 (L) 02/25/2020 1200   CL 100 11/16/2011 0513   CO2 29 02/25/2020 1200   CO2 29 11/16/2011 0513   BUN 23 02/25/2020 1200   BUN 25 11/25/2011 1119   BUN 15 11/16/2011 0513   CREATININE 1.56 (H) 02/25/2020 1200   CREATININE 1.26 11/16/2011 0513      Component Value Date/Time   CALCIUM 8.5 (L) 02/25/2020 1200   CALCIUM 9.1 11/16/2011 0513   ALKPHOS 113 02/25/2020 1200   AST 8 (L) 02/25/2020 1200   ALT 16 02/25/2020 1200   BILITOT 0.4 02/25/2020 1200       RADIOGRAPHIC STUDIES: CT Chest Wo Contrast  Result Date: 02/07/2020 CLINICAL DATA:  Primary Cancer Type: Lung Imaging Indication: Assess response to therapy Interval therapy since last imaging? Yes Initial Cancer Diagnosis Date: 10/17/2019; Established by: Biopsy-proven Detailed Pathology:  Limited stage small cell lung cancer. Primary Tumor location: Right lower lobe/right hilar mass with obstruction of the right lower lobe bronchus. Surgeries: Cardiac catheterization.  Cholecystectomy. Chemotherapy: Yes; Ongoing? Yes; Most recent administration: 01/21/2020 Immunotherapy? No Radiation therapy? Yes; Date Range: 10/18/2019 - 11/28/2019; Target: Right lung EXAM: CT CHEST WITHOUT CONTRAST TECHNIQUE: Multidetector CT imaging of the chest was performed following the standard protocol without IV contrast. COMPARISON:  Most recent CT chest 12/27/2019.  11/01/2019 PET-CT. FINDINGS: Cardiovascular: Aortic and branch vessel atherosclerosis. Right Port-A-Cath tip high right atrium. Normal heart size, without pericardial effusion. Multivessel coronary artery atherosclerosis. Mediastinum/Nodes: Right-sided thyroid nodule again identified at approximately 1.0 cm. Not clinically significant; no follow-up imaging recommended (ref: J Am Coll Radiol. 2015 Feb;12(2): 143-50).No supraclavicular adenopathy. Right paratracheal node measures 1.9 x 2.9 cm on 53/2 versus 2.1 x 2.9 cm on the prior exam (when remeasured). A node within the subcarinal station with  extension into the azygoesophageal recess measures 1.4 cm on 75/2 versus 1.5 cm on the prior exam (when remeasured). Lungs/Pleura: No pleural fluid. Obstruction of the superior segment right lower lobe bronchus is similar, including on 68/5. Moderate centrilobular emphysema A posteromedial right upper lobe 3 mm nodule on 46/5 is similar. The central superior segment right lower lobe (i.e. Perihilar) lung mass measures 4.7 x 3.4 cm on 64/2. Minimally decreased from 4.9 x 3.7 cm on the prior exam when measured similarly. Interval development of right greater than left, peribronchovascular predominant ground-glass and airspace opacities many of which are nodular. Example in the right upper lobe on 63/5, right lower lobe at up to 9 mm on 80/5 and more medial right lower lobe on  88/5. 1.1 cm sub solid right lower lobe pulmonary nodule is unchanged on 106/5. Upper Abdomen: Cholecystectomy. Normal imaged portions of the liver, spleen, stomach, pancreas, adrenal glands, kidneys. Descending duodenal diverticulum. Musculoskeletal: Nonacute anterior left rib fractures. Upper thoracic spondylosis. IMPRESSION: 1. Mild response to therapy of central superior segment right lower lobe (perihilar) lung mass. 2. Development of right greater than left peribronchovascular ground-glass and nodular airspace disease. Not a typical appearance for radiation change. Favored to represent infection. Correlate with symptoms. 3. Similar to minimal improvement in thoracic adenopathy. 4. Aortic atherosclerosis (ICD10-I70.0), coronary artery atherosclerosis and emphysema (ICD10-J43.9). 5. Similar sub solid right lower lobe pulmonary nodule. Electronically Signed   By: Abigail Miyamoto M.D.   On: 02/07/2020 09:51    ASSESSMENT AND PLAN: This is a very pleasant 74 years old white male recently diagnosed with limited stage small cell lung cancer presented with large right lower lobe/right hilar mass with obstruction of the right lower lobe bronchus and  bulky right lower lobe paratracheal and subcarinal adenopathy diagnosed in July 2021. The patient is currently undergoing systemic chemotherapy with carboplatin and etoposide status post 5 cycles. The patient tolerated the last cycle of his treatment well except for the fatigue secondary to chemotherapy-induced anemia. He has been tolerating this treatment well with no concerning adverse effects except for the fatigue from the chemotherapy-induced anemia I recommended for him to proceed with cycle #6 today as planned. I will see him back for follow-up visit in around 3 weeks with repeat CT scan of the chest for restaging of his disease. The patient was advised to call immediately if he has any concerning symptoms in the interval. The patient voices understanding of current disease status and treatment options and is in agreement with the current care plan.  All questions were answered. The patient knows to call the clinic with any problems, questions or concerns. We can certainly see the patient much sooner if necessary.  Disclaimer: This note was dictated with voice recognition software. Similar sounding words can inadvertently be transcribed and may not be corrected upon review.

## 2020-03-03 NOTE — Patient Instructions (Addendum)
Stop your atenolol  Stop you Lisinopril   For right now, check your blood pressure every day, and write down those numbers.

## 2020-03-03 NOTE — Patient Instructions (Signed)
[facility] Discharge Instructions for Patients Receiving Chemotherapy  Today you received the following chemotherapy agents: Carboplatin and Etoposide  To help prevent nausea and vomiting after your treatment, we encourage you to take your nausea medication  as prescribed.    If you develop nausea and vomiting that is not controlled by your nausea medication, call the clinic.   BELOW ARE SYMPTOMS THAT SHOULD BE REPORTED IMMEDIATELY:  *FEVER GREATER THAN 100.5 F  *CHILLS WITH OR WITHOUT FEVER  NAUSEA AND VOMITING THAT IS NOT CONTROLLED WITH YOUR NAUSEA MEDICATION  *UNUSUAL SHORTNESS OF BREATH  *UNUSUAL BRUISING OR BLEEDING  TENDERNESS IN MOUTH AND THROAT WITH OR WITHOUT PRESENCE OF ULCERS  *URINARY PROBLEMS  *BOWEL PROBLEMS  UNUSUAL RASH Items with * indicate a potential emergency and should be followed up as soon as possible.  Feel free to call the clinic should you have any questions or concerns. The clinic phone number is [phone].  Please show the [place] at check-in to the Emergency Department and triage nurse.

## 2020-03-04 ENCOUNTER — Inpatient Hospital Stay: Payer: Medicare Other

## 2020-03-04 ENCOUNTER — Other Ambulatory Visit: Payer: Self-pay

## 2020-03-04 VITALS — BP 119/78 | HR 100 | Temp 98.1°F | Resp 18

## 2020-03-04 DIAGNOSIS — E119 Type 2 diabetes mellitus without complications: Secondary | ICD-10-CM | POA: Diagnosis not present

## 2020-03-04 DIAGNOSIS — D6481 Anemia due to antineoplastic chemotherapy: Secondary | ICD-10-CM | POA: Diagnosis not present

## 2020-03-04 DIAGNOSIS — C3431 Malignant neoplasm of lower lobe, right bronchus or lung: Secondary | ICD-10-CM | POA: Diagnosis not present

## 2020-03-04 DIAGNOSIS — Z5111 Encounter for antineoplastic chemotherapy: Secondary | ICD-10-CM | POA: Diagnosis not present

## 2020-03-04 DIAGNOSIS — C3491 Malignant neoplasm of unspecified part of right bronchus or lung: Secondary | ICD-10-CM

## 2020-03-04 MED ORDER — SODIUM CHLORIDE 0.9 % IV SOLN
Freq: Once | INTRAVENOUS | Status: AC
  Filled 2020-03-04: qty 250

## 2020-03-04 MED ORDER — HEPARIN SOD (PORK) LOCK FLUSH 100 UNIT/ML IV SOLN
500.0000 [IU] | Freq: Once | INTRAVENOUS | Status: AC | PRN
  Administered 2020-03-04: 500 [IU]
  Filled 2020-03-04: qty 5

## 2020-03-04 MED ORDER — SODIUM CHLORIDE 0.9 % IV SOLN
10.0000 mg | Freq: Once | INTRAVENOUS | Status: AC
  Administered 2020-03-04: 10 mg via INTRAVENOUS
  Filled 2020-03-04: qty 10

## 2020-03-04 MED ORDER — SODIUM CHLORIDE 0.9% FLUSH
10.0000 mL | INTRAVENOUS | Status: DC | PRN
  Administered 2020-03-04: 10 mL
  Filled 2020-03-04: qty 10

## 2020-03-04 MED ORDER — SODIUM CHLORIDE 0.9 % IV SOLN
80.0000 mg/m2 | Freq: Once | INTRAVENOUS | Status: AC
  Administered 2020-03-04: 170 mg via INTRAVENOUS
  Filled 2020-03-04: qty 8.5

## 2020-03-04 NOTE — Progress Notes (Addendum)
CAMARA, ROSANDER ([id number]) Visit Report for 02/27/2020 Debridement Details Patient Name: [name], [name] Date of Service: 02/27/2020 1:30 PM Medical Record Number: [id number] Patient Account Number: [id number] Date of Birth/Sex: [birth year] (74 y.o. M) Treating RN: Cornell Barman Primary Care Provider: [name] Other Clinician: Referring Provider: [name] Treating Provider/Extender: Tito Dine in Treatment: 12 Debridement Performed for Wound #9 Right,Dorsal Foot Assessment: Performed By: Physician Ricard Dillon, MD Debridement Type: Debridement Severity of Tissue Pre Debridement: Fat layer exposed Level of Consciousness (Pre- Awake and Alert procedure): Pre-procedure Verification/Time Out Yes - 14:13 Taken: Total Area Debrided (L x W): 1 (cm) x 0.8 (cm) = 0.8 (cm) Tissue and other material Viable, Non-Viable, Slough, Subcutaneous, Slough debrided: Level: Skin/Subcutaneous Tissue Debridement Description: Excisional Instrument: Curette Bleeding: Minimum Hemostasis Achieved: Pressure Response to Treatment: Procedure was tolerated well Level of Consciousness (Post- Awake and Alert procedure): Post Debridement Measurements of Total Wound Length: (cm) 1 Width: (cm) 0.8 Depth: (cm) 0.8 Volume: (cm) 0.503 Character of Wound/Ulcer Post Debridement: Requires Further Debridement Severity of Tissue Post Debridement: Fat layer exposed Post Procedure Diagnosis Same as Pre-procedure Electronic Signature(s) Signed: 02/28/2020 5:12:38 PM By: Gretta Cool, BSN, RN, CWS, Kim RN, BSN Signed: 03/04/2020 6:44:03 AM By: Linton Ham MD Entered By: Linton Ham on 02/27/2020 14:23:59 [name] ([id number]) -------------------------------------------------------------------------------- HPI Details Patient Name: [name] Date of Service: 02/27/2020 1:30 PM Medical Record Number: [id number] Patient Account Number: [id number] Date of  Birth/Sex: Oct 19, 1945 (74 y.o. M) Treating RN: Cornell Barman Primary Care Provider: [name] Other Clinician: Referring Provider: [name] Treating Provider/Extender: Tito Dine in Treatment: 12 History of Present Illness HPI Description: 10/04/16 on evaluation today patient presents for initial evaluation concerning a laceration of the right wrist and hand on the bowler aspect. He tells me that he fell into glass following a medication change in regard to his diabetes. He initially went to [facility] where she was sutured and subsequently his daughter who is a Marine scientist removed the teachers at the appropriate time. Unfortunately the wound has done poorly and dehissed which has caused him some trouble including infection. He has been on antibiotics both topically and orally though the wound continues to show signs of necrotic tissue according to notes which is what he was sent to [place] for evaluation concerning. He does have diabetes and unfortunately this is severely uncontrolled. He does see an endocrinologist and is working on this. 10/11/2016 -- the patient's notes from the ER were reviewed and he had his suturing done on 09/17/2016 and had necrotic skin flaps which had to be trimmed the last visit he was here. He has managed to get his Santyl ointment and has been using this appropriately. 10/21/16 patient's wound appears to be doing very well on evaluation today. He still has some Slough covering the wound bed but this is nowhere near as severe as when i saw this initially two weeks ago nor even my review of his pictures last week. I'm pleased with how this is progressing. Patient's wound over the right form appears to be doing fairly well on evaluation today. There is no evidence of infection and he has a small area at the crease of where she is wrist extends and flexes that is still open and appears to be having difficulty due to the fact that it is cracking open.  I think this is something that may be controlled with moisture control that is keeping the wound a little bit more  moist. Nonetheless this is a tiny region compared to the initial wound and overall appears to be doing very well. No fevers, chills, nausea, or vomiting noted at this time. 11/25/16 on evaluation today patient appears to be doing well in regard to his right wrist ulcer. In fact this appears to be completely healed he is having no discomfort. He is pleased with how this is progressed. Readmission: 06/24/17 on evaluation today patient appears back in our clinic regarding issues that he has been having with his right ankle on the lateral malleolus as well is in the dorsal surface of his left foot. Both have been going on for several weeks unfortunately. He tells me that initially this was being managed by Dr. Edilia Bo and I did review the notes from Dr. Edilia Bo in epic. Patient was placed on amoxicillin initially and then subsequently Keflex following. He has been using triple antibiotic ointment over-the-counter along with a Band-Aid for the wound currently. He did not have any Santyl remaining. His most recent hemoglobin A1c which was just two days ago was 10.3. Fortunately he seems to have excellent blood flow in the bilateral lower extremities with his left ABI being 0.95 in the right ABI 1.01. Patient does have some pain in regard to each of these ulcerations but he tells me it's not nearly as severe as the hand wound that I help treat earlier over the summer. This was in 2018. 07/15/17 on evaluation today patient's lower surety ulcer is actually both appear to be doing fairly well at this point. He does have a little bit of skin overlapping the ulcer on the right lateral ankle this was debrided away today just with saline and gauze without complication patient had no discomfort or pain. Otherwise patient's left foot ulcer does appear to be showing signs of improvement he does seem to have  a right great toe. Nikki and noted at this point in time. I think that this does need to be managed at this point. No fevers, chills, nausea, or vomiting noted at this time. 07/22/17 on evaluation today patient's wounds in regard to his lower extremities appear to be doing somewhat better. His right ankle wound in fact appears to likely be completely closed. The left dorsal foot ulcer though still open does appear to be a little bit smaller and does also seem to be making good progress. He has not been having problems with the treatment at this point in the general he does not seem to show as much erythema surrounding the wound bed which is good news. He is not having significant discomfort. 08/12/17 on evaluation today patients wounds of both appeared to be doing better in fact they both appear to be completely healed. Overall I'm very pleased with how things have progressed over the past week even more than what I expected. Obviously this is good news and he is happy about this. He did have a fall when going to his orthopedic doctor recently fortunately he does not appear to have injured anything severely but nonetheless does have a couple of bumps and bruises nothing significant at this time but he needs to be seen and wound care for. He may have broken his left wrist he is following up with orthopedics in that regard. Readmission: 10/24/17 on evaluation today patient presents for reevaluation after having been discharged Aug 12, 2017. His wound was healed at that point although he states that it has been somewhat discolored since that time. He has not experienced the  drainage as far as the wound is concerned but the fact that it was still discolored been greater than two months after the fact made him want to come in for reevaluation. Upon inspection today the patient has no pain although he does have neuropathy. He does have a discolored region of the dorsal surface of the left foot which was the  site where the ulcer was that we previously treated. The good news is he does not again have any discomfort although obviously I do believe this may be some scar tissue and just discoloration in that regard. Again he hasn't had any drainage or discharge since I last saw him and at this point I see no evidence of that either this area shows complete epithelialization. In general I do not feel like there's any significant issue at this point in that regard. READMISSION 02/09/2019 This is a 73 year old man with type 2 diabetes poorly controlled with significant peripheral neuropathy. He has been in this clinic before in 2018 with a laceration on his right hand and then again in 2019 with an area over the right lateral malleolus. 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 He states that the wound is been there for the last week or KIRILL, CHATTERJEE. ([id number]) 2. It is been uncomfortable to walk on. The man is an active man having to look after an elderly mother. Past medical history, type 2 diabetes with polyneuropathy with a recent hemoglobin A1c in September 2011 0.3, lumbar disc disease, hypergonadism., Left foot drop and gout. We did not repeat his ABIs in the clinic quoting values from July 2019 of 0.95 and 1.01. He is not felt to have significant PAD 02/19/2019 on evaluation today patient actually appears to be doing better with regard to his foot wound based on what I am seeing currently. He was seen by Dr. Dellia Nims on readmission back into the clinic last week when I was off. The good news is the patient has been tolerating the dressing changes without any complication in fact has not really had much in the way of drainage. He does have an AFO brace which she believes is actually pushing on this area as well and has contributed to the issue. Fortunately there is no signs of active infection at this point. His  ABIs appear to be well. 03/05/2019 on evaluation today patient appears to be doing well in regard to his foot ulcer. He has been tolerating the dressing changes without complication. Fortunately there is no signs of active infection. The wound is measuring somewhat smaller and overall I feel like he is making good progress at this point. 03/12/2019 on evaluation today patient actually appears to be doing quite well with regard to his foot ulcer. The wound is looking better the measurement is not significantly better at this point unfortunately but nonetheless again the overall appearance is improving I think he is making some progress. I still think the big issue here is foot drop and again the patient wants to see if potentially he could get a brace to help with this. Subsequently I think that we can definitely make a referral to [facility] to see if they can help [place] in this regard. He is in agreement with that plan. 03/26/2019 on evaluation today patient appears to be doing more poorly in regard to his foot ulcer at this time. He did see podiatry in  order to see about getting a brace but unfortunately he tells me that Medicare is not likely to pay for one for him as he had 1 I guess 8 months ago. However this one that apparently goes in his shoes that does not seem to be working out well for him. Unfortunately there are signs of active infection at this time. No fevers, chills, nausea, vomiting, or diarrhea. Systemically. The patient has erythema on the dorsal surface of his foot that tracks from the lateral portion of his foot and again he does have a blister around the wound as well which is worse compared to previous. Overall I am concerned. 04/02/2019 on evaluation today patient appears to be doing about the same with regard to his wound. Unfortunately there is no signs of significant improvement although I do think there is no signs of infection which is good news. His drainage is also slowed  down tremendously. I think at this point he would benefit from the initiation of a total contact cast. The only concern I have is that over his balance. I explained to the patient that if we were to initiate the cast I think it would be greatly beneficial for him but he would need to ensure not to walk at all unless he uses a walker he actually has 2 walkers one for inside his home and one that he keeps out in the car. For that reason he would be covered in that regard. I just do not want him to have any accidents and fall with the cast. 12/28-Patient comes in 1 week out a day early for his appointment he was in the hospital for 2 days for uncontrolled hyperglycemia and required management of high blood sugars, with some adjustments in his home regimen of insulin, patient stubbed his right great toe today and put a Band-Aid on it, he is here for his left lateral foot ulcer and we are using silver alginate dressing, he was intact total contact cast with difficulty with balance to the extent that that had to be discontinued 04/16/2019 on evaluation today patient appears to be doing better with regard to the overall appearance of his wound size. This is good news. We had attempted a total contact cast but unfortunately he had some falling issues that the family contacted [place] about we took the cast off we did not put it back on. Nonetheless it ended up that just a couple days later he was in the hospital due to elevations in his blood sugar which went to around 500 and even a little higher. Subsequently he was admitted to the hospital and was in the hospital for a total of 3 days and that included Christmas day. Fortunately there got his blood sugar under control he tells me that this is running about 200 now which is much better news. There does not appear to be any signs of active infection at this time. No fevers, chills, nausea, vomiting, or diarrhea. 04/30/2019 on evaluation today patient actually  appears to be doing slightly worse compared to previous as far as the overall size of his wound is concerned. There also is some need for sharp debridement today as well. Fortunately there is no evidence of active infection which is good news. No fevers, chills, nausea, vomiting, or diarrhea. 05/07/2019 upon evaluation today patient appears to be doing excellent in regard to his plantar foot ulcer. He has been tolerating the dressing changes without complication. Fortunately there is no signs of active infection  at this time. No fevers, chills, nausea, vomiting, or diarrhea. He does tell me that he actually got in touch with [employer] and they feel like they can add diagnosis codes to his orders in order to get an external brace for him as opposed to the one that goes internal to issue. That would be excellent for his foot drop and I did tell him that if there is anything I need to fill out in that regard I will be more than happy to oblige and fill out what is needed for [employer]. 05/14/2019 upon evaluation today patient's original wound site actually appears to be doing much better which is good news. Fortunately there is no signs of active infection at this time. No fevers, chills, nausea, vomiting, or diarrhea. With that being said the patient unfortunately is continuing to have issues with rubbing he tells me that the felt slid on his foot although he did not realize it until today. Nonetheless I think this did cause a blister around the edge of the wound and he does an open wound that is slightly larger overall in measurement compared to last time mainly due to this blister. Other than that I feel like he is doing quite well. 05/21/2019 upon evaluation today patient appears to be doing excellent in regard to his wound at this time on the plantar foot. He is showing signs of improvement he does have some slough and biofilm noted on the surface of the wound this is going require some sharp debridement  today as well. Fortunately there is no evidence of active infection. No fevers, chills, nausea, vomiting, or diarrhea. 05/28/2019 on evaluation today patient's wound actually appears to be measuring smaller upon initial inspection and I do believe that it is doing well. With that being said he continues to have a blister on the outside/lateral edge of the wound that occurs as a result of rubbing due to his dropfoot currently. Subsequently we have filled out the paperwork for his new external brace but again that does take several weeks to get that completed once everything is returned to back we did send this back last week as far as the paperwork was concerned. With that being said there does not appear to be any signs of active infection at this time. 06/04/2019 on evaluation today patient's wound actually appears to be doing slightly better compared to last week which is good news. He still states that his insurance unfortunately does not seem to want to cover any of his new brace. He is going to just pay for this himself. Nonetheless he is going by there today when he leaves here that is [employer] in [place] that is going to be making this brace for him. 06/18/2019 upon evaluation today patient fortunately does have his new brace which is the external AFO brace with bilateral uprights. Subsequently he seems to be doing much better in fact the wound already appears better. He has not even been using the felt he is just using a dressing over [place] ([id number]) top of the wound and overall I am very pleased with how things seem to be progressing. There is no signs of active infection at this time which is great news and I think that the AFO is good to help him tremendously as far as trying to keep pressure off of the 06/25/2019 upon evaluation today patient seems to be making good progress in regard to his wound. He has been tolerating the dressing changes without complication.  Fortunately  there is no signs of active infection at this time. No fevers, chills, nausea, vomiting, or diarrhea. 07/02/2019 upon evaluation today patient unfortunately had a blistered area on the bottom of his foot tracking more towards the midline of the plantar aspect. He does not seem to have issues with rubbing more laterally that he used to have but nonetheless this is still giving him some trouble and causing problems here. Fortunately there is no signs of active infection at this time. No fevers, chills, nausea, vomiting, or diarrhea. 07/09/2019 upon evaluation today patient appears to be doing well with regard to his plantar foot ulcer. Definitely this is much improved compared to last week's visit which again last week the issue there was that he had an area of callus buildup on the plantar aspect of his foot I was able to clean this away and after effectively doing so the wound seems to be doing much better which is great news. There is no signs of active infection at this time. 07/16/2019 upon evaluation today patient appears to be making some progress in my opinion with regard to his plantar foot ulcer. He has been tolerating the dressing changes without complication. Fortunately there is no evidence of active infection at this time which is great news. 07/23/2019 upon evaluation today patient appears to be doing well with regard to his wounds on the plantar foot. He has been tolerating the dressing changes without complication. These do seem to be healing. 08/06/2019 upon evaluation today patient's wound actually appears to be showing some signs of improvement though he still has a significant amount of callus buildup over this location. That is can require some debridement today. With that being said the actual wound openings seem to be dramatically improved which is great news. There is no signs of active infection at this time. No fevers, chills, nausea, vomiting, or diarrhea. 08/13/2019 upon evaluation  today patient appears to be doing well with regard to his foot ulcer. He has been tolerating the dressing changes without complication. Fortunately there is no signs of active infection at this time. No fevers, chills, nausea, vomiting, or diarrhea. 08/20/2019 upon evaluation today patient appears to be doing excellent in regard to his foot ulcer. I do believe he still showing signs of improvement this is very slow but again I think still pressure getting to the area even with his brace and custom shoe is still part of the main issue with the delay here. Nonetheless I do not see any signs of worsening or infection all of which is good news. 5/20; left lateral foot wound. He wears an AFO brace for foot drop related to his diabetes. The wound is small and clean looking he is using silver collagen. We have apparently tried him in offloading shoe he could not tolerate this. He does not have good balance. 09/11/2019 upon evaluation today patient appears to be doing more poorly in regard to his foot ulcer as compared to last time I saw him. Unfortunately he does tell me as we discussed on Friday that he had pus and blood coming from the area underneath the callus. Unfortunately I think that this is a combination of the callus not being trimmed away enough and potentially the felt pad causing an abnormal rubbing which has happened before which led to this becoming more irritated and likely infected based on what I am seeing today. The patient takes ampicillin all the time. Subsequently he also states that he has been trying for  a couple of days to take the clindamycin of which she had some leftover from previous. With that being said it upset his stomach. The antibiotic that I sent in for him on Friday unfortunately I wrote the wrong number on as far as the number of pills that he needed. With that being said I corrected that this morning but he tells me is not can pick it up as that seems to be making him have  trouble with his stomach being upset at this point. Fortunately there is no signs of active infection at this time systemically. 09/17/2019 upon evaluation today patient appears to be doing better in regard to his foot ulcer. Has been taking ampicillin that he had leftover from a previous prescription. With that being said he did go see his doctor today concerning his diabetes and notes that his hemoglobin A1c was 13 obviously this is elevated and there can be working on that. Also did review the culture result that we finally got back for final well that showed rare Pseudomonas and a few group B strep noted. The Pseudomonas was sensitive to Cipro but again the wound is doing so much better with the ampicillin I think this is probably not a causative organism I do not see anything that really has me concerned as far as infection is concerned today. Therefore I would hold off on prescribing Cipro for the patient and have him discontinue the ampicillin until completion. 09/24/2019 upon evaluation today patient appears to be doing 09/24/2019 upon evaluation today patient appears to be doing well with regard to his foot ulcer. He actually has not enlarged any and seems to be measuring smaller quite significantly at this point. His toe ulcer is also doing somewhat better which is good news. Fortunately there is no evidence of active infection at this time. No fevers, chills, nausea, vomiting, or diarrhea. 10/08/2019 upon evaluation today patient appears to be doing well with regard to his foot ulcer all things considered. Fortunately there is no signs of active infection at this time. No fevers, chills, nausea, vomiting, or diarrhea. Fortunately he states that overall he has been doing much better with regard to not have any blistering on the side of his foot. Unfortunately he did have a fall last night due to the fact that he got up to walk without his brace on and did not have his walker either it was around 4  in the morning and his mother was calling out. Subsequently he states that the foot drop got him and he ended up following. Nonetheless he states he normally uses his walker I explained he needs to try to make sure not to ever go without the walker. No matter how attempting the situation may be. Readmission: 12/03/2019 upon evaluation today patient presents for follow-up after having had surgery last week on his foot for surgical debridement and then subsequent wound VAC placement. He had gone into the hospital as he was having a significant infection and not feeling well. He is unfortunately since I last saw him also been under the care of the cancer doctors and they have been taking good care of him. He does seem to be in fairly high spirits all things considered. With that being said he has lung cancer he tells me that his back from the radiation feels like it is constantly burned. 12/10/2019 on evaluation today patient appears to be doing well with regard to his wound. I do believe the wound VAC is doing a  great job at helping to manage his drainage and overall health and to granulate in the wound. He has less undermining noted today in fact almost none. Home health is coming out to change the wound VAC on a regular basis Monday, Wednesday, and Friday. 12/24/2019 on evaluation today patient appears to be doing fairly well in regard to the wound on the plantar aspect of his foot. With that being said he does have a significant feeling in of what has been going on with regard to the ulceration which does not have the depth that it did in the past. With that being said I do believe that based on what I am seeing at this point the patient likely does need to take a break from the wound VAC and in fact it appears the wound VAC was actually placed directly on the skin as far as the bridging was concerned causing some irritation of the top of the patient's foot. This has me concerned as well that if were  not careful that could worsen. I do believe a break with the wound VAC however will be fine and then we can see where we go from there. [name], [name] ([id number]) 01/07/2020 upon evaluation today patient actually appears to be doing much better in regard to his wound. He has been tolerating the dressing changes without complication. There is no signs of active infection and overall I feel like he is managing quite nicely. I do believe that we would likely discontinue the [facility] altogether today. 10/13; follow-up visit. Patient has a area on the left lateral plantar foot. He has been using Hydrofera Blue and bordered foam he has home health changing this 3 times a week. He is nonambulatory. He avoids pushing the wheelchair with that part of his foot and instead uses his heel. We have had a nice improvement today and overall surface area 10/27; 2-week follow-up. He has been using Hydrofera Blue and bordered foam on a wound on the left lateral foot. He told me he was nonambulatory last time from foot drop although he walked in the clinic with a walker this week apparently did not have any transportation. Also he has a new wound on the right dorsal medial foot which she says was caused by foot where trauma 11/10; 2-week follow-up. We have been using Hydrofera Blue on the left lateral foot roughly at the base of the fifth metatarsal. He came in last visit with a punched out area on the right dorsal foot. He says this was foot where trauma. I reiterated that he is not wearing his previous braces and he is in. He spends most of his time in his wheelchair. He says he is careful not to be pushing himself around in the wheelchair with his feet. He is wearing regular tennis shoes. I taken a bit of time to review the history of the wound on the left foot. This actually was a chronic wound for him. Episodic infections. He had arterial studies done in August these were normal. ABI in the right of 1.05, TBI at 0.65  with triphasic waveforms on the left 1.01 with a TBI of 0.78 and again triphasic waveforms. Does not appear to be a arterial issue 11/17 we brought the patient in today to look at the area on the right dorsal foot and the left lateral plantar foot. The left was the original wound. Patient states the right dorsal foot happened from trauma from his brace. He is no longer using this.  He is mostly in surgical shoes that we provide except when he goes out driving. We changed him to silver collagen last week. He does not have an arterial issue He came to the clinic today got out of his car went to the back of his car felt presyncopal and fell down. When we got out there to help him up he was alert conversational. Blood pressure little reduced at 767 systolic: We got him in the exam room. Otherwise he looks fine Electronic Signature(s) Signed: 03/04/2020 6:44:03 AM By: Linton Ham MD Entered By: Linton Ham on 02/27/2020 14:32:50 [name] ([id number]) -------------------------------------------------------------------------------- Physical Exam Details Patient Name: [name] Date of Service: 02/27/2020 1:30 PM Medical Record Number: [id number] Patient Account Number: [id number] Date of Birth/Sex: 1945-08-23 (74 y.o. M) Treating RN: Cornell Barman Primary Care Provider: [name] Other Clinician: Referring Provider: [name] Treating Provider/Extender: Tito Dine in Treatment: 12 Constitutional Sitting or standing Blood Pressure is within target range for patient.. Pulse regular and within target range for patient.[name] Kitchen Respirations regular, non- labored and within target range.. Temperature is normal and within the target range for the patient.[name] Kitchen appears in no distress. Cardiovascular Pedal pulses palpable and strong bilaterally.. Notes Wound exam oHis original wound is at the base of the fifth metatarsal. This is come down nicely in size. Wound tissue  looks healthy surrounding epithelialization. No evidence of infection oOn the right dorsal foot laterally the wound is small and punched out. Debris in the center which I removed with a #3 curette there is not a viable surface here and this goes down to deeper tissue at roughly 0.8 cm. There is probably about 0.5 cm of circumferential undermining. I saw no evidence of infection here however Electronic Signature(s) Signed: 03/04/2020 6:44:03 AM By: Linton Ham MD Entered By: Linton Ham on 02/27/2020 14:34:49 [name] ([id number]) -------------------------------------------------------------------------------- Physician Orders Details Patient Name: [name] Date of Service: 02/27/2020 1:30 PM Medical Record Number: [id number] Patient Account Number: [id number] Date of Birth/Sex: 04/27/1945 (74 y.o. M) Treating RN: Cornell Barman Primary Care Provider: [name] Other Clinician: Referring Provider: [name] Treating Provider/Extender: Tito Dine in Treatment: 12 Verbal / Phone Orders: No Diagnosis Coding Wound Cleansing Wound #7 Left,Lateral,Plantar Foot o Clean wound with Normal Saline. Wound #9 Right,Dorsal Foot o Clean wound with Normal Saline. Primary Wound Dressing Wound #7 Left,Lateral,Plantar Foot o Silver Collagen - moisten with hydrogel Wound #9 Right,Dorsal Foot o Silver Collagen - moisten with hydrogel Secondary Dressing Wound #7 Left,Lateral,Plantar Foot o Boardered Foam Dressing Wound #9 Right,Dorsal Foot o Boardered Foam Dressing Dressing Change Frequency Wound #7 Left,Lateral,Plantar Foot o Change Dressing Monday, Wednesday, Friday - May skip of patient has wound care appointment on specific day. Wound #9 Right,Dorsal Foot o Change Dressing Monday, Wednesday, Friday - May skip of patient has wound care appointment on specific day. Follow-up Appointments Wound #7 Left,Lateral,Plantar Foot o Return  Appointment in 1 week. Wound #9 Right,Dorsal Foot o Return Appointment in 1 week. Edema Control Wound #7 Left,Lateral,Plantar Foot o Elevate legs to the level of the heart and pump ankles as often as possible Wound #9 Right,Dorsal Foot o Elevate legs to the level of the heart and pump ankles as often as possible Off-Loading Wound #7 Left,Lateral,Plantar Foot o Open toe surgical shoe with peg assist. o Other: - No weight on foot, Wheelchair only Wound #9 Right,Dorsal Foot o Open toe surgical shoe with peg assist. o Other: - No weight on foot,  Wheelchair only Additional Orders / Instructions Wound #7 Left,Lateral,Plantar Foot o Increase protein intake. ANTONIUS, HARTLAGE ([id number]) Wound #9 Right,Dorsal Foot o Increase protein intake. Home Health Wound #7 [place] Visits - [place] Nurse may visit PRN to address patientos wound care needs. o FACE TO FACE ENCOUNTER: MEDICARE and MEDICAID PATIENTS: I certify that this patient is under my care and that I had a face-to-face encounter that meets the physician face-to-face encounter requirements with this patient on this date. The encounter with the patient was in whole or in part for the following MEDICAL CONDITION: (primary reason for [place]) MEDICAL NECESSITY: I certify, that based on my findings, NURSING services are a medically necessary home health service. HOME BOUND STATUS: I certify that my clinical findings support that this patient is homebound (i.e., Due to illness or injury, pt requires aid of supportive devices such as crutches, cane, wheelchairs, walkers, the use of special transportation or the assistance of another person to leave their place of residence. There is a normal inability to leave the home and doing so requires considerable and taxing effort. Other absences are for medical reasons / religious services and are infrequent or of  short duration when for other reasons). o If current dressing causes regression in wound condition, may D/C ordered dressing product/s and apply Normal Saline Moist Dressing daily until next [place] / Other MD appointment. [place] of regression in wound condition at [phone]. o Please direct any NON-WOUND related issues/requests for orders to patient's Primary Care Physician Wound #9 [place] Visits - [place] Nurse may visit PRN to address patientos wound care needs. o FACE TO FACE ENCOUNTER: MEDICARE and MEDICAID PATIENTS: I certify that this patient is under my care and that I had a face-to-face encounter that meets the physician face-to-face encounter requirements with this patient on this date. The encounter with the patient was in whole or in part for the following MEDICAL CONDITION: (primary reason for [place]) MEDICAL NECESSITY: I certify, that based on my findings, NURSING services are a medically necessary home health service. HOME BOUND STATUS: I certify that my clinical findings support that this patient is homebound (i.e., Due to illness or injury, pt requires aid of supportive devices such as crutches, cane, wheelchairs, walkers, the use of special transportation or the assistance of another person to leave their place of residence. There is a normal inability to leave the home and doing so requires considerable and taxing effort. Other absences are for medical reasons / religious services and are infrequent or of short duration when for other reasons). o If current dressing causes regression in wound condition, may D/C ordered dressing product/s and apply Normal Saline Moist Dressing daily until next [place] / Other MD appointment. [place] of regression in wound condition at [phone]. o Please direct any NON-WOUND related issues/requests for  orders to patient's Primary Care Physician Electronic Signature(s) Signed: 03/13/2020 12:56:38 PM By: Linton Ham MD Signed: 03/14/2020 4:56:42 PM By: Gretta Cool BSN, RN, CWS, Kim RN, BSN Previous Signature: 02/28/2020 5:12:38 PM Version By: Gretta Cool BSN, RN, CWS, Kim RN, BSN Previous Signature: 03/04/2020 6:44:03 AM Version By: Linton Ham MD Entered By: Gretta Cool, BSN, RN, CWS, Kim on 03/05/2020 11:35:29 [name], [name] ([id number]) -------------------------------------------------------------------------------- Problem List Details Patient Name: [name] Date of Service: 02/27/2020 1:30 PM Medical Record Number: [id number] Patient Account Number: [id number] Date  of Birth/Sex: [birth year] (74 y.o. 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 12/03/2019 No Yes muscle L97.512 Non-pressure chronic ulcer of other part of right foot with fat layer 02/06/2020 No Yes exposed E11.42 Type 2 diabetes mellitus with diabetic polyneuropathy 12/03/2019 No Yes M21.372 Foot drop, left foot 12/03/2019 No Yes Inactive Problems Resolved Problems Electronic Signature(s) Signed: 03/04/2020 6:44:03 AM By: Linton Ham MD Entered By: Linton Ham on 02/27/2020 14:23:08 [name] ([id number]) -------------------------------------------------------------------------------- Progress Note Details Patient Name: [name] Date of Service: 02/27/2020 1:30 PM Medical Record Number: [id number] Patient Account Number: [id number] Date of Birth/Sex: Feb 27, 1946 (74 y.o. M) Treating RN: Cornell Barman Primary Care Provider: [name] Other Clinician: Referring Provider: [name] Treating Provider/Extender: Tito Dine in Treatment: 12 Subjective History of Present Illness (HPI) 10/04/16 on evaluation today patient presents for initial evaluation concerning a laceration of the right wrist and hand on the bowler aspect. He tells me that he fell into glass following a medication change in regard to his diabetes. He initially went to [facility] where she was sutured and subsequently his daughter who is a Marine scientist removed the teachers at the appropriate time. Unfortunately the wound has done poorly and dehissed which has caused him some trouble including infection. He has been on antibiotics both topically and orally though the wound continues to show signs of necrotic tissue according to notes which is what he was sent to [place] for evaluation concerning. He does have diabetes and unfortunately this is severely uncontrolled. He does see an endocrinologist and is working on this. 10/11/2016 -- the patient's notes from the ER were reviewed and he had his suturing done on 09/17/2016 and had necrotic skin flaps which had to be trimmed the last visit he was here. He has managed to get his Santyl ointment and has been using this appropriately. 10/21/16 patient's wound appears to be doing very well on evaluation today. He still has some [place] covering the wound bed but this is nowhere near as severe as when i saw this initially two weeks ago nor even my review of his pictures last week. I'm pleased with how this is progressing. Patient's wound over the right form appears to be doing fairly well on evaluation today. There is no evidence of infection and he has a small area at the crease of where she is wrist extends and flexes that is still open and appears to be having difficulty due to the fact that it is cracking open. I think this is something that may be controlled with moisture control that is keeping the  wound a little bit more moist. Nonetheless this is a tiny region compared to the initial wound and overall appears to be doing very well. No fevers, chills, nausea, or vomiting noted at this time. 11/25/16 on evaluation today patient appears to be doing well in regard to his right wrist ulcer. In fact this appears to be completely healed he is having no discomfort. He is pleased with how this is progressed. Readmission: 06/24/17 on evaluation today patient appears back in our clinic regarding issues that he has been having with his right ankle on the lateral malleolus as well  is in the dorsal surface of his left foot. Both have been going on for several weeks unfortunately. He tells me that initially this was being managed by Dr. Edilia Bo and I did review the notes from Dr. Edilia Bo in epic. Patient was placed on amoxicillin initially and then subsequently Keflex following. He has been using triple antibiotic ointment over-the-counter along with a Band-Aid for the wound currently. He did not have any Santyl remaining. His most recent hemoglobin A1c which was just two days ago was 10.3. Fortunately he seems to have excellent blood flow in the bilateral lower extremities with his left ABI being 0.95 in the right ABI 1.01. Patient does have some pain in regard to each of these ulcerations but he tells me it's not nearly as severe as the hand wound that I help treat earlier over the summer. This was in 2018. 07/15/17 on evaluation today patient's lower surety ulcer is actually both appear to be doing fairly well at this point. He does have a little bit of skin overlapping the ulcer on the right lateral ankle this was debrided away today just with saline and gauze without complication patient had no discomfort or pain. Otherwise patient's left foot ulcer does appear to be showing signs of improvement he does seem to have a right great toe. Nikki and noted at this point in time. I think that this does need to  be managed at this point. No fevers, chills, nausea, or vomiting noted at this time. 07/22/17 on evaluation today patient's wounds in regard to his lower extremities appear to be doing somewhat better. His right ankle wound in fact appears to likely be completely closed. The left dorsal foot ulcer though still open does appear to be a little bit smaller and does also seem to be making good progress. He has not been having problems with the treatment at this point in the general he does not seem to show as much erythema surrounding the wound bed which is good news. He is not having significant discomfort. 08/12/17 on evaluation today patients wounds of both appeared to be doing better in fact they both appear to be completely healed. Overall I'm very pleased with how things have progressed over the past week even more than what I expected. Obviously this is good news and he is happy about this. He did have a fall when going to his orthopedic doctor recently fortunately he does not appear to have injured anything severely but nonetheless does have a couple of bumps and bruises nothing significant at this time but he needs to be seen and wound care for. He may have broken his left wrist he is following up with orthopedics in that regard. Readmission: 10/24/17 on evaluation today patient presents for reevaluation after having been discharged Aug 12, 2017. His wound was healed at that point although he states that it has been somewhat discolored since that time. He has not experienced the drainage as far as the wound is concerned but the fact that it was still discolored been greater than two months after the fact made him want to come in for reevaluation. Upon inspection today the patient has no pain although he does have neuropathy. He does have a discolored region of the dorsal surface of the left foot which was the site where the ulcer was that we previously treated. The good news is he does not again have  any discomfort although obviously I do believe this may be some scar tissue and just  discoloration in that regard. Again he hasn't had any drainage or discharge since I last saw him and at this point I see no evidence of that either this area shows complete epithelialization. In general I do not feel like there's any significant issue at this point in that regard. 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 He has been in this clinic before in 2018 with a laceration on his right hand and then again in 2019 with an area over the right lateral malleolus. 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 He states that the wound is been there for the last week or JAGAR, LUA. ([id number]) 2. It is been uncomfortable to walk on. The man is an active man having to look after an elderly mother. Past medical history, type 2 diabetes with polyneuropathy with a recent hemoglobin A1c in September 2011 0.3, lumbar disc disease, hypergonadism., Left foot drop and gout. We did not repeat his ABIs in the clinic quoting values from July 2019 of 0.95 and 1.01. He is not felt to have significant PAD 02/19/2019 on evaluation today patient actually appears to be doing better with regard to his foot wound based on what I am seeing currently. He was seen by Dr. Dellia Nims on readmission back into the clinic last week when I was off. The good news is the patient has been tolerating the dressing changes without any complication in fact has not really had much in the way of drainage. He does have an AFO brace which she believes is actually pushing on this area as well and has contributed to the issue. Fortunately there is no signs of active infection at this point. His ABIs appear to be well. 03/05/2019 on evaluation today patient appears to be doing well in regard  to his foot ulcer. He has been tolerating the dressing changes without complication. Fortunately there is no signs of active infection. The wound is measuring somewhat smaller and overall I feel like he is making good progress at this point. 03/12/2019 on evaluation today patient actually appears to be doing quite well with regard to his foot ulcer. The wound is looking better the measurement is not significantly better at this point unfortunately but nonetheless again the overall appearance is improving I think he is making some progress. I still think the big issue here is foot drop and again the patient wants to see if potentially he could get a brace to help with this. Subsequently I think that we can definitely make a referral to [facility] to see if they can help [place] in this regard. He is in agreement with that plan. 03/26/2019 on evaluation today patient appears to be doing more poorly in regard to his foot ulcer at this time. He did see podiatry in order to see about getting a brace but unfortunately he tells me that Medicare is not likely to pay for one for him as he had 1 I guess 8 months ago. However this one that apparently goes in his shoes that does not seem to be working out well for him. Unfortunately there are signs of active infection at this time. No fevers, chills, nausea, vomiting, or diarrhea. Systemically. The patient has erythema on the dorsal surface of his foot that tracks from the lateral portion of his foot and again he does have a  blister around the wound as well which is worse compared to previous. Overall I am concerned. 04/02/2019 on evaluation today patient appears to be doing about the same with regard to his wound. Unfortunately there is no signs of significant improvement although I do think there is no signs of infection which is good news. His drainage is also slowed down tremendously. I think at this point he would benefit from the initiation of a total contact  cast. The only concern I have is that over his balance. I explained to the patient that if we were to initiate the cast I think it would be greatly beneficial for him but he would need to ensure not to walk at all unless he uses a walker he actually has 2 walkers one for inside his home and one that he keeps out in the car. For that reason he would be covered in that regard. I just do not want him to have any accidents and fall with the cast. 12/28-Patient comes in 1 week out a day early for his appointment he was in the hospital for 2 days for uncontrolled hyperglycemia and required management of high blood sugars, with some adjustments in his home regimen of insulin, patient stubbed his right great toe today and put a Band-Aid on it, he is here for his left lateral foot ulcer and we are using silver alginate dressing, he was intact total contact cast with difficulty with balance to the extent that that had to be discontinued 04/16/2019 on evaluation today patient appears to be doing better with regard to the overall appearance of his wound size. This is good news. We had attempted a total contact cast but unfortunately he had some falling issues that the family contacted [place] about we took the cast off we did not put it back on. Nonetheless it ended up that just a couple days later he was in the hospital due to elevations in his blood sugar which went to around 500 and even a little higher. Subsequently he was admitted to the hospital and was in the hospital for a total of 3 days and that included Christmas day. Fortunately there got his blood sugar under control he tells me that this is running about 200 now which is much better news. There does not appear to be any signs of active infection at this time. No fevers, chills, nausea, vomiting, or diarrhea. 04/30/2019 on evaluation today patient actually appears to be doing slightly worse compared to previous as far as the overall size of his wound  is concerned. There also is some need for sharp debridement today as well. Fortunately there is no evidence of active infection which is good news. No fevers, chills, nausea, vomiting, or diarrhea. 05/07/2019 upon evaluation today patient appears to be doing excellent in regard to his plantar foot ulcer. He has been tolerating the dressing changes without complication. Fortunately there is no signs of active infection at this time. No fevers, chills, nausea, vomiting, or diarrhea. He does tell me that he actually got in touch with [employer] and they feel like they can add diagnosis codes to his orders in order to get an external brace for him as opposed to the one that goes internal to issue. That would be excellent for his foot drop and I did tell him that if there is anything I need to fill out in that regard I will be more than happy to oblige and fill out what is needed for [employer]. 05/14/2019  upon evaluation today patient's original wound site actually appears to be doing much better which is good news. Fortunately there is no signs of active infection at this time. No fevers, chills, nausea, vomiting, or diarrhea. With that being said the patient unfortunately is continuing to have issues with rubbing he tells me that the felt slid on his foot although he did not realize it until today. Nonetheless I think this did cause a blister around the edge of the wound and he does an open wound that is slightly larger overall in measurement compared to last time mainly due to this blister. Other than that I feel like he is doing quite well. 05/21/2019 upon evaluation today patient appears to be doing excellent in regard to his wound at this time on the plantar foot. He is showing signs of improvement he does have some slough and biofilm noted on the surface of the wound this is going require some sharp debridement today as well. Fortunately there is no evidence of active infection. No fevers, chills, nausea,  vomiting, or diarrhea. 05/28/2019 on evaluation today patient's wound actually appears to be measuring smaller upon initial inspection and I do believe that it is doing well. With that being said he continues to have a blister on the outside/lateral edge of the wound that occurs as a result of rubbing due to his dropfoot currently. Subsequently we have filled out the paperwork for his new external brace but again that does take several weeks to get that completed once everything is returned to back we did send this back last week as far as the paperwork was concerned. With that being said there does not appear to be any signs of active infection at this time. 06/04/2019 on evaluation today patient's wound actually appears to be doing slightly better compared to last week which is good news. He still states that his insurance unfortunately does not seem to want to cover any of his new brace. He is going to just pay for this himself. Nonetheless he is going by there today when he leaves here that is [employer] in [place] that is going to be making this brace for him. 06/18/2019 upon evaluation today patient fortunately does have his new brace which is the external AFO brace with bilateral uprights. Subsequently he seems to be doing much better in fact the wound already appears better. He has not even been using the felt he is just using a dressing over [place] ([id number]) top of the wound and overall I am very pleased with how things seem to be progressing. There is no signs of active infection at this time which is great news and I think that the AFO is good to help him tremendously as far as trying to keep pressure off of the 06/25/2019 upon evaluation today patient seems to be making good progress in regard to his wound. He has been tolerating the dressing changes without complication. Fortunately there is no signs of active infection at this time. No fevers, chills, nausea, vomiting, or  diarrhea. 07/02/2019 upon evaluation today patient unfortunately had a blistered area on the bottom of his foot tracking more towards the midline of the plantar aspect. He does not seem to have issues with rubbing more laterally that he used to have but nonetheless this is still giving him some trouble and causing problems here. Fortunately there is no signs of active infection at this time. No fevers, chills, nausea, vomiting, or diarrhea. 07/09/2019 upon evaluation today patient  appears to be doing well with regard to his plantar foot ulcer. Definitely this is much improved compared to last week's visit which again last week the issue there was that he had an area of callus buildup on the plantar aspect of his foot I was able to clean this away and after effectively doing so the wound seems to be doing much better which is great news. There is no signs of active infection at this time. 07/16/2019 upon evaluation today patient appears to be making some progress in my opinion with regard to his plantar foot ulcer. He has been tolerating the dressing changes without complication. Fortunately there is no evidence of active infection at this time which is great news. 07/23/2019 upon evaluation today patient appears to be doing well with regard to his wounds on the plantar foot. He has been tolerating the dressing changes without complication. These do seem to be healing. 08/06/2019 upon evaluation today patient's wound actually appears to be showing some signs of improvement though he still has a significant amount of callus buildup over this location. That is can require some debridement today. With that being said the actual wound openings seem to be dramatically improved which is great news. There is no signs of active infection at this time. No fevers, chills, nausea, vomiting, or diarrhea. 08/13/2019 upon evaluation today patient appears to be doing well with regard to his foot ulcer. He has been tolerating  the dressing changes without complication. Fortunately there is no signs of active infection at this time. No fevers, chills, nausea, vomiting, or diarrhea. 08/20/2019 upon evaluation today patient appears to be doing excellent in regard to his foot ulcer. I do believe he still showing signs of improvement this is very slow but again I think still pressure getting to the area even with his brace and custom shoe is still part of the main issue with the delay here. Nonetheless I do not see any signs of worsening or infection all of which is good news. 5/20; left lateral foot wound. He wears an AFO brace for foot drop related to his diabetes. The wound is small and clean looking he is using silver collagen. We have apparently tried him in offloading shoe he could not tolerate this. He does not have good balance. 09/11/2019 upon evaluation today patient appears to be doing more poorly in regard to his foot ulcer as compared to last time I saw him. Unfortunately he does tell me as we discussed on Friday that he had pus and blood coming from the area underneath the callus. Unfortunately I think that this is a combination of the callus not being trimmed away enough and potentially the felt pad causing an abnormal rubbing which has happened before which led to this becoming more irritated and likely infected based on what I am seeing today. The patient takes ampicillin all the time. Subsequently he also states that he has been trying for a couple of days to take the clindamycin of which she had some leftover from previous. With that being said it upset his stomach. The antibiotic that I sent in for him on Friday unfortunately I wrote the wrong number on as far as the number of pills that he needed. With that being said I corrected that this morning but he tells me is not can pick it up as that seems to be making him have trouble with his stomach being upset at this point. Fortunately there is no signs of active  infection  at this time systemically. 09/17/2019 upon evaluation today patient appears to be doing better in regard to his foot ulcer. Has been taking ampicillin that he had leftover from a previous prescription. With that being said he did go see his doctor today concerning his diabetes and notes that his hemoglobin A1c was 13 obviously this is elevated and there can be working on that. Also did review the culture result that we finally got back for final well that showed rare Pseudomonas and a few group B strep noted. The Pseudomonas was sensitive to Cipro but again the wound is doing so much better with the ampicillin I think this is probably not a causative organism I do not see anything that really has me concerned as far as infection is concerned today. Therefore I would hold off on prescribing Cipro for the patient and have him discontinue the ampicillin until completion. 09/24/2019 upon evaluation today patient appears to be doing 09/24/2019 upon evaluation today patient appears to be doing well with regard to his foot ulcer. He actually has not enlarged any and seems to be measuring smaller quite significantly at this point. His toe ulcer is also doing somewhat better which is good news. Fortunately there is no evidence of active infection at this time. No fevers, chills, nausea, vomiting, or diarrhea. 10/08/2019 upon evaluation today patient appears to be doing well with regard to his foot ulcer all things considered. Fortunately there is no signs of active infection at this time. No fevers, chills, nausea, vomiting, or diarrhea. Fortunately he states that overall he has been doing much better with regard to not have any blistering on the side of his foot. Unfortunately he did have a fall last night due to the fact that he got up to walk without his brace on and did not have his walker either it was around 4 in the morning and his mother was calling out. Subsequently he states that the foot drop  got him and he ended up following. Nonetheless he states he normally uses his walker I explained he needs to try to make sure not to ever go without the walker. No matter how attempting the situation may be. Readmission: 12/03/2019 upon evaluation today patient presents for follow-up after having had surgery last week on his foot for surgical debridement and then subsequent wound VAC placement. He had gone into the hospital as he was having a significant infection and not feeling well. He is unfortunately since I last saw him also been under the care of the cancer doctors and they have been taking good care of him. He does seem to be in fairly high spirits all things considered. With that being said he has lung cancer he tells me that his back from the radiation feels like it is constantly burned. 12/10/2019 on evaluation today patient appears to be doing well with regard to his wound. I do believe the wound VAC is doing a great job at helping to manage his drainage and overall health and to granulate in the wound. He has less undermining noted today in fact almost none. Home health is coming out to change the wound VAC on a regular basis Monday, Wednesday, and Friday. 12/24/2019 on evaluation today patient appears to be doing fairly well in regard to the wound on the plantar aspect of his foot. With that being said he does have a significant feeling in of what has been going on with regard to the ulceration which does not have the depth  that it did in the past. With that being said I do believe that based on what I am seeing at this point the patient likely does need to take a break from the wound VAC and in fact it appears the wound VAC was actually placed directly on the skin as far as the bridging was concerned causing some irritation of the top of the patient's foot. This has me concerned as well that if were not careful that could worsen. I do believe a break with the wound VAC however will be fine  and then we can see where we go from there. [name], [name] ([id number]) 01/07/2020 upon evaluation today patient actually appears to be doing much better in regard to his wound. He has been tolerating the dressing changes without complication. There is no signs of active infection and overall I feel like he is managing quite nicely. I do believe that we would likely discontinue the [facility] altogether today. 10/13; follow-up visit. Patient has a area on the left lateral plantar foot. He has been using Hydrofera Blue and bordered foam he has home health changing this 3 times a week. He is nonambulatory. He avoids pushing the wheelchair with that part of his foot and instead uses his heel. We have had a nice improvement today and overall surface area 10/27; 2-week follow-up. He has been using Hydrofera Blue and bordered foam on a wound on the left lateral foot. He told me he was nonambulatory last time from foot drop although he walked in the clinic with a walker this week apparently did not have any transportation. Also he has a new wound on the right dorsal medial foot which she says was caused by foot where trauma 11/10; 2-week follow-up. We have been using Hydrofera Blue on the left lateral foot roughly at the base of the fifth metatarsal. He came in last visit with a punched out area on the right dorsal foot. He says this was foot where trauma. I reiterated that he is not wearing his previous braces and he is in. He spends most of his time in his wheelchair. He says he is careful not to be pushing himself around in the wheelchair with his feet. He is wearing regular tennis shoes. I taken a bit of time to review the history of the wound on the left foot. This actually was a chronic wound for him. Episodic infections. He had arterial studies done in August these were normal. ABI in the right of 1.05, TBI at 0.65 with triphasic waveforms on the left 1.01 with a TBI of 0.78 and again triphasic waveforms.  Does not appear to be a arterial issue 11/17 we brought the patient in today to look at the area on the right dorsal foot and the left lateral plantar foot. The left was the original wound. Patient states the right dorsal foot happened from trauma from his brace. He is no longer using this. He is mostly in surgical shoes that we provide except when he goes out driving. We changed him to silver collagen last week. He does not have an arterial issue He came to the clinic today got out of his car went to the back of his car felt presyncopal and fell down. When we got out there to help him up he was alert conversational. Blood pressure little reduced at 761 systolic: We got him in the exam room. Otherwise he looks fine Objective Constitutional Sitting or standing Blood Pressure is within target range for  patient.. Pulse regular and within target range for patient.[name] Kitchen Respirations regular, non- labored and within target range.. Temperature is normal and within the target range for the patient.[name] Kitchen appears in no distress. Vitals Time Taken: 1:50 PM, Height: 71 in, Weight: 190 lbs, BMI: 26.5, Temperature: 97.6 F, Pulse: 97 bpm, Respiratory Rate: 18 breaths/min, Blood Pressure: 106/64 mmHg. Cardiovascular Pedal pulses palpable and strong bilaterally.. General Notes: Wound exam His original wound is at the base of the fifth metatarsal. This is come down nicely in size. Wound tissue looks healthy surrounding epithelialization. No evidence of infection On the right dorsal foot laterally the wound is small and punched out. Debris in the center which I removed with a #3 curette there is not a viable surface here and this goes down to deeper tissue at roughly 0.8 cm. There is probably about 0.5 cm of circumferential undermining. I saw no evidence of infection here however Integumentary (Hair, Skin) Wound #7 status is Open. Original cause of wound was Surgical Injury. The wound is located on the  [place]. The wound measures 0.6cm length x 0.8cm width x 0.2cm depth; 0.377cm^2 area and 0.075cm^3 volume. There is Fat Layer (Subcutaneous Tissue) exposed. There is a medium amount of serosanguineous drainage noted. The wound margin is thickened. There is small (1-33%) red, pink, hyper - granulation within the wound bed. There is a small (1-33%) amount of necrotic tissue within the wound bed including Adherent Slough. Wound #9 status is Open. Original cause of wound was Pressure Injury. The wound is located on the Right,Dorsal Foot. The wound measures 1cm length x 0.8cm width x 0.4cm depth; 0.628cm^2 area and 0.251cm^3 volume. There is Fat Layer (Subcutaneous Tissue) exposed. There is undermining starting at 12:00 and ending at 12:00 with a maximum distance of 0.5cm. There is a medium amount of serosanguineous drainage noted. The wound margin is flat and intact. There is small (1-33%) pink granulation within the wound bed. There is a large (67-100%) amount of necrotic tissue within the wound bed including Adherent Slough. 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 ([id number]) Procedures Wound #9 Pre-procedure diagnosis of Wound #9 is a Diabetic Wound/Ulcer of the Lower Extremity located on the Right,Dorsal Foot .Severity of Tissue Pre Debridement is: Fat layer exposed. There was a Excisional Skin/Subcutaneous Tissue Debridement with a total area of 0.8 sq cm performed by Ricard Dillon, MD. With the following instrument(s): Curette to remove Viable and Non-Viable tissue/material. Material removed includes Subcutaneous Tissue and Slough and. No specimens were taken. A time out was conducted at  14:13, prior to the start of the procedure. A Minimum amount of bleeding was controlled with Pressure. The procedure was tolerated well. Post Debridement Measurements: 1cm length x 0.8cm width x 0.8cm depth; 0.503cm^3 volume. Character of Wound/Ulcer Post Debridement requires further debridement. Severity of Tissue Post Debridement is: Fat layer exposed. Post procedure Diagnosis Wound #9: Same as Pre-Procedure Plan Wound Cleansing: Wound #7 Left,Lateral,Plantar Foot: Clean wound with Normal Saline. Wound #9 Right,Dorsal Foot: Clean wound with Normal Saline. Primary Wound Dressing: Wound #7 Left,Lateral,Plantar Foot: Silver Collagen - moisten with hydrogel Wound #9 Right,Dorsal Foot: Silver Collagen - moisten with hydrogel Secondary Dressing: Wound #7 Left,Lateral,Plantar Foot: Boardered Foam Dressing Wound #9 Right,Dorsal Foot: Boardered Foam  Dressing Follow-up Appointments: Wound #7 Left,Lateral,Plantar Foot: Return Appointment in 1 week. Wound #9 Right,Dorsal Foot: Return Appointment in 1 week. Edema Control: Wound #7 Left,Lateral,Plantar Foot: Elevate legs to the level of the heart and pump ankles as often as possible Wound #9 Right,Dorsal Foot: Elevate legs to the level of the heart and pump ankles as often as possible Off-Loading: Wound #7 Left,Lateral,Plantar Foot: Open toe surgical shoe with peg assist. Other: - No weight on foot, Wheelchair only Wound #9 Right,Dorsal Foot: Open toe surgical shoe Other: - No weight on foot, Wheelchair only Additional Orders / Instructions: Wound #7 Left,Lateral,Plantar Foot: Increase protein intake. Wound #9 Right,Dorsal Foot: Increase protein intake. Home Health: Wound #7 Left,Lateral,Plantar Foot: Continue Home Health Visits - [place] Nurse may visit PRN to address patient s wound care needs. FACE TO FACE ENCOUNTER: MEDICARE and MEDICAID PATIENTS: I certify that this patient is under my care and that I had a  face-to-face encounter that meets the physician face-to-face encounter requirements with this patient on this date. The encounter with the patient was in whole or in part for the following MEDICAL CONDITION: (primary reason for [place]) MEDICAL NECESSITY: I certify, that based on my findings, NURSING services are a medically necessary home health service. HOME BOUND STATUS: I certify that my clinical findings support that this patient is homebound (i.e., Due to illness or injury, pt requires aid of supportive devices such as crutches, cane, wheelchairs, walkers, the use of special transportation or the assistance of another person to leave their place of residence. There is a normal inability to leave the home and doing so requires considerable and taxing effort. Other absences are for medical reasons / religious services and are infrequent or of short duration when for other reasons). If current dressing causes regression in wound condition, may D/C ordered dressing product/s and apply Normal Saline Moist Dressing daily until next [place] / Other MD appointment. [place] of regression in wound condition at [phone]. Please direct any NON-WOUND related issues/requests for orders to patient's Primary Care Physician [name], [name] ([id number]) Wound #9 Right,Dorsal Foot: [place] Visits - [place] Nurse may visit PRN to address patient s wound care needs. FACE TO FACE ENCOUNTER: MEDICARE and MEDICAID PATIENTS: I certify that this patient is under my care and that I had a face-to-face encounter that meets the physician face-to-face encounter requirements with this patient on this date. The encounter with the patient was in whole or in part for the following MEDICAL CONDITION: (primary reason for [place]) MEDICAL NECESSITY: I certify, that based on my findings, NURSING services are a medically necessary home health service.  HOME BOUND STATUS: I certify that my clinical findings support that this patient is homebound (i.e., Due to illness or injury, pt requires aid of supportive devices such as crutches, cane, wheelchairs, walkers, the use of special transportation or the assistance of another person to leave their place of residence. There is a normal inability to leave the home and doing so requires considerable and taxing effort. Other absences are for medical reasons / religious services and are infrequent or of short duration when for other reasons). If current dressing causes regression in wound condition, may D/C ordered dressing product/s and apply Normal Saline Moist Dressing daily until next [place] / Other MD appointment. [place] of regression in wound condition at [phone]. Please direct any NON-WOUND related issues/requests for orders to patient's Primary Care  Physician 1.1 am going to continue with silver collagen to both wound areas. The area on the left foot plantar is actually doing nicely and appears to be epithelializing. 2. The area punched out on the right lateral foot is a bit difficult to figure. He does not have an arterial issue on either side. This does not particularly look like a brace injury and that it is small but deep and circumferential. He says he is no longer using the braces 3. We will continue with silver collagen to both of these areas 4. He had a fall in the parking lot. Blood pressure is slightly low he is on atenolol and lisinopril for hypertension. I wonder whether this man has orthostatic hypotension/diabetic autonomic neuropathy. I talked to him about this I told him how to check if he has some assistance at home with his home blood pressure monitor. If so he would have to follow-up with his primary doctor 5. No evidence of infection in either wound area Electronic Signature(s) Signed: 03/04/2020 6:44:03 AM By: Linton Ham MD Entered  By: Linton Ham on 02/27/2020 14:37:36 [name] ([id number]) -------------------------------------------------------------------------------- [place] Details Patient Name: [name] Date of Service: 02/27/2020 Medical Record Number: [id number] Patient Account Number: [id number] Date of Birth/Sex: Jun 17, 1945 (74 y.o. M) Treating RN: Cornell Barman Primary Care Provider: [name] Other Clinician: Referring Provider: [name] Treating Provider/Extender: Tito Dine in Treatment: 12 Diagnosis Coding ICD-10 Codes Code Description E11.621 Type 2 diabetes mellitus with foot ulcer L97.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scle L97.512 Non-pressure chronic ulcer of other part of right foot with fat layer exposed E11.42 Type 2 diabetes mellitus with diabetic polyneuropathy M21.372 Foot drop, left foot Facility Procedures CPT4 Code: 54650354 Description: 65681 - DEB SUBQ TISSUE 20 SQ CM/< Modifier: Quantity: 1 CPT4 Code: Description: ICD-10 Diagnosis Description 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

## 2020-03-04 NOTE — Progress Notes (Signed)
[name] ([id number]) Visit Report for 02/27/2020 Arrival Information Details Patient Name: [name] Date of Service: 02/27/2020 1:30 PM Medical Record Number: [id number] Patient Account Number: [id number] Date of Birth/Sex: May 08, 1945 (74 y.o. M) Treating RN: Dolan Amen Primary Care Lindzy Rupert: [name] Other Clinician: Referring Jhan Conery: [name] Treating Dietrick Barris/Extender: Tito Dine in Treatment: 12 Visit Information History Since Last Visit Pain Present Now: No Patient Arrived: Walker Arrival Time: 13:53 Accompanied By: self Transfer Assistance: Manual Patient Identification Verified: Yes Secondary Verification Process Completed: Yes Patient Requires Transmission-Based Precautions: No Electronic Signature(s) Signed: 02/27/2020 3:22:47 PM By: Georges Mouse, Minus Breeding Entered By: Georges Mouse, Minus Breeding on 02/27/2020 13:53:45 [name] ([id number]) -------------------------------------------------------------------------------- Encounter Discharge Information Details Patient Name: [name] Date of Service: 02/27/2020 1:30 PM Medical Record Number: [id number] Patient Account Number: [id number] Date of Birth/Sex: April 17, 1945 (74 y.o. M) Treating RN: Cornell Barman Primary Care Fahad Cisse: [name] Other Clinician: Referring Ceil Roderick: [name] Treating Brentney Goldbach/Extender: Tito Dine in Treatment: 12 Encounter Discharge Information Items Post Procedure Vitals Discharge Condition: Stable Temperature (F): 97.6 Ambulatory Status: Wheelchair Pulse (bpm): 97 Discharge Destination: Home Respiratory Rate (breaths/min): 16 Transportation: Private Auto Blood Pressure (mmHg): 106/64 Accompanied By: self Schedule Follow-up Appointment: No Clinical Summary of Care: Electronic Signature(s) Signed: 02/28/2020 5:12:38 PM By: Gretta Cool, BSN, RN, CWS, Kim RN, BSN Entered By: Gretta Cool, BSN, RN, CWS, Kim on  02/27/2020 14:27:57 [name] ([id number]) -------------------------------------------------------------------------------- Lower Extremity Assessment Details Patient Name: [name] Date of Service: 02/27/2020 1:30 PM Medical Record Number: [id number] Patient Account Number: [id number] Date of Birth/Sex: [birth year] (74 y.o. M) Treating RN: Dolan Amen Primary Care Anusha Claus: [name] Other Clinician: Referring Roth Ress: [name] Treating Aashi Derrington/Extender: Ricard Dillon Weeks in Treatment: 12 Edema Assessment Assessed: [Left: Yes] [Right: Yes] Edema: [Left: No] [Right: No] Vascular Assessment Pulses: Dorsalis Pedis Palpable: [Left:Yes] [Right:Yes] Electronic Signature(s) Signed: 02/27/2020 3:22:47 PM By: Georges Mouse, Minus Breeding Entered By: Georges Mouse, Minus Breeding on 02/27/2020 14:06:52 [name] ([id number]) -------------------------------------------------------------------------------- Multi Wound Chart Details Patient Name: [name] Date of Service: 02/27/2020 1:30 PM Medical Record Number: [id number] Patient Account Number: [id number] Date of Birth/Sex: 1945-11-30 (74 y.o. M) Treating RN: Cornell Barman Primary Care Jahi Roza: [name] Other Clinician: Referring Diara Chaudhari: [name] Treating Shaleigh Laubscher/Extender: Tito Dine in Treatment: 12 Vital Signs Height(in): 71 Pulse(bpm): 97 Weight(lbs): 190 Blood Pressure(mmHg): 106/64 Body Mass Index(BMI): 26 Temperature(F): 97.6 Respiratory Rate(breaths/min): 18 Photos: [N/A:N/A] Wound Location: Left, Lateral, Plantar Foot Right, Dorsal Foot N/A Wounding Event: Surgical Injury Pressure Injury N/A Primary Etiology: Diabetic Wound/Ulcer of the Lower Diabetic Wound/Ulcer of the Lower N/A Extremity Extremity Secondary Etiology: N/A Pressure Ulcer N/A Comorbid History: Cataracts, Middle ear problems, Cataracts, Middle ear problems, N/A Sleep Apnea,  Hypertension, Type II Sleep Apnea, Hypertension, Type II Diabetes, Gout, Osteoarthritis, Diabetes, Gout, Osteoarthritis, Neuropathy, Received Neuropathy, Received Chemotherapy Chemotherapy Date Acquired: 11/23/2019 01/23/2020 N/A Weeks of Treatment: 12 3 N/A Wound Status: Open Open N/A Measurements L x W x D (cm) 0.6x0.8x0.2 1x0.8x0.4 N/A Area (cm) : 0.377 0.628 N/A Volume (cm) : 0.075 0.251 N/A % Reduction in Area: 94.10% -100.00% N/A % Reduction in Volume: 97.60% -709.70% N/A Starting Position 1 (o'clock): 12 Ending Position 1 (o'clock): 12 Maximum Distance 1 (cm): 0.5 Undermining: N/A Yes N/A Classification: Grade 3 Grade 2 N/A Exudate Amount: Medium Medium N/A Exudate Type: Serosanguineous Serosanguineous N/A Exudate Color: red, brown red, brown N/A Wound Margin: Thickened Flat and Intact N/A Granulation Amount: Small (1-33%) Small (1-33%)  N/A Granulation Quality: Red, Pink, Hyper-granulation Pink N/A Necrotic Amount: Small (1-33%) Large (67-100%) N/A Exposed Structures: Fat Layer (Subcutaneous Tissue): Fat Layer (Subcutaneous Tissue): N/A Yes Yes Fascia: No Fascia: No Tendon: No Tendon: No Muscle: No Muscle: No Joint: No Joint: No Bone: No Bone: No Epithelialization: Small (1-33%) Small (1-33%) N/A Debridement: N/A Debridement - Excisional N/A Pre-procedure Verification/Time N/A 14:13 N/A Out Taken: Tissue Debrided: N/A Subcutaneous, Slough N/A Level: N/A Skin/Subcutaneous Tissue N/A [name] ([id number]) Debridement Area (sq cm): N/A 0.8 N/A Instrument: N/A Curette N/A Bleeding: N/A Minimum N/A Hemostasis Achieved: N/A Pressure N/A Debridement Treatment N/A Procedure was tolerated well N/A Response: Post Debridement N/A 1x0.8x0.8 N/A Measurements L x W x D (cm) Post Debridement Volume: N/A 0.503 N/A (cm) Procedures Performed: N/A Debridement N/A Treatment Notes Electronic Signature(s) Signed: 03/04/2020 6:44:03 AM By: Linton Ham  MD Entered By: Linton Ham on 02/27/2020 14:23:21 [name] ([id number]) -------------------------------------------------------------------------------- Multi-Disciplinary Care Plan Details Patient Name: [name] Date of Service: 02/27/2020 1:30 PM Medical Record Number: [id number] Patient Account Number: [id number] Date of Birth/Sex: 07-10-1945 (74 y.o. M) Treating RN: Cornell Barman Primary Care Jaeveon Ashland: [name] Other Clinician: Referring Kanita Delage: [name] Treating Zurii Hewes/Extender: Tito Dine in Treatment: 12 Active Inactive Necrotic Tissue Nursing Diagnoses: Impaired tissue integrity related to necrotic/devitalized tissue Goals: Necrotic/devitalized tissue will be minimized in the wound bed Date Initiated: 12/03/2019 Target Resolution Date: 12/07/2019 Goal Status: Active Interventions: Assess patient pain level pre-, during and post procedure and prior to discharge Treatment Activities: Apply topical anesthetic as ordered : 12/03/2019 Notes: Orientation to the Wound Care Program Nursing Diagnoses: Knowledge deficit related to the wound healing center program Goals: Patient/caregiver will verbalize understanding of the [place] Date Initiated: 12/03/2019 Target Resolution Date: 12/07/2019 Goal Status: Active Interventions: Provide education on orientation to the wound center Notes: Pain, Acute or Chronic Nursing Diagnoses: Pain Management - Cyclic Acute (Dressing Change Related) Goals: Patient will verbalize adequate pain control and receive pain control interventions during procedures as needed Date Initiated: 12/03/2019 Target Resolution Date: 12/07/2019 Goal Status: Active Interventions: Assess comfort goal upon admission Treatment Activities: Administer pain control measures as ordered : 12/03/2019 Notes: Wound/Skin Impairment Nursing Diagnoses: Impaired tissue integrity UCHENNA, [place]  ([id number]) Goals: Ulcer/skin breakdown will have a volume reduction of 30% by week 4 Date Initiated: 12/03/2019 Target Resolution Date: 01/03/2020 Goal Status: Active Ulcer/skin breakdown will have a volume reduction of 50% by week 8 Date Initiated: 12/03/2019 Target Resolution Date: 02/02/2020 Goal Status: Active Ulcer/skin breakdown will have a volume reduction of 80% by week 12 Date Initiated: 12/03/2019 Target Resolution Date: 03/04/2020 Goal Status: Active Interventions: Assess ulceration(s) every visit Treatment Activities: Referred to DME Vera Furniss for dressing supplies : 12/03/2019 Topical wound management initiated : 12/03/2019 Notes: Electronic Signature(s) Signed: 02/28/2020 5:12:38 PM By: Gretta Cool, BSN, RN, CWS, Kim RN, BSN Entered By: Gretta Cool, BSN, RN, CWS, Kim on 02/27/2020 14:11:20 [name] ([id number]) -------------------------------------------------------------------------------- Pain Assessment Details Patient Name: [name] Date of Service: 02/27/2020 1:30 PM Medical Record Number: [id number] Patient Account Number: [id number] Date of Birth/Sex: 01/06/1946 (74 y.o. M) Treating RN: Dolan Amen Primary Care Corrin Hingle: [name] Other Clinician: Referring Yariah Selvey: [name] Treating Haadiya Frogge/Extender: Tito Dine in Treatment: 12 Active Problems Location of Pain Severity and Description of Pain Patient Has Paino No Site Locations Rate the pain. Current Pain Level: 0 Pain Management and Medication Current Pain Management: Electronic Signature(s) Signed: 02/27/2020 3:22:47 PM By: Georges Mouse, Minus Breeding  Entered By: Georges Mouse, Minus Breeding on 02/27/2020 13:54:14 [name] ([id number]) -------------------------------------------------------------------------------- Patient/Caregiver Education Details Patient Name: [name] Date of Service: 02/27/2020 1:30 PM Medical Record Number: [id number] Patient Account  Number: [id number] Date of Birth/Gender: 31-May-1945 (74 y.o. M) Treating RN: Cornell Barman Primary Care Physician: [name] Other Clinician: Referring Physician: [name] Treating Physician/Extender: Tito Dine in Treatment: 12 Education Assessment Education Provided To: Patient Education Topics Provided Wound/Skin Impairment: Handouts: Caring for Your Ulcer Methods: Demonstration, Explain/Verbal Responses: State content correctly Electronic Signature(s) Signed: 02/28/2020 5:12:38 PM By: Gretta Cool, BSN, RN, CWS, Kim RN, BSN Entered By: Gretta Cool, BSN, RN, CWS, Kim on 02/27/2020 14:25:20 [name] ([id number]) -------------------------------------------------------------------------------- Wound Assessment Details Patient Name: [name] Date of Service: 02/27/2020 1:30 PM Medical Record Number: [id number] Patient Account Number: [id number] Date of Birth/Sex: 12-08-1945 (74 y.o. M) Treating RN: Dolan Amen Primary Care Mykenzie Ebanks: [name] Other Clinician: Referring Georgina Krist: [name] Treating Jireh Vinas/Extender: Tito Dine in Treatment: 12 Wound Status Wound Number: 7 Primary Diabetic Wound/Ulcer of the Lower Extremity Etiology: Wound Location: Left, Lateral, Plantar Foot Wound Open Wounding Event: Surgical Injury Status: Date Acquired: 11/23/2019 Comorbid Cataracts, Middle ear problems, Sleep Apnea, Weeks Of Treatment: 12 History: Hypertension, Type II Diabetes, Gout, Osteoarthritis, Clustered Wound: No Neuropathy, Received Chemotherapy Photos Wound Measurements Length: (cm) 0.6 Width: (cm) 0.8 Depth: (cm) 0.2 Area: (cm) 0.377 Volume: (cm) 0.075 % Reduction in Area: 94.1% % Reduction in Volume: 97.6% Epithelialization: Small (1-33%) Wound Description Classification: Grade 3 Wound Margin: Thickened Exudate Amount: Medium Exudate Type: Serosanguineous Exudate Color: red, brown Foul Odor After  Cleansing: No Slough/Fibrino Yes Wound Bed Granulation Amount: Small (1-33%) Exposed Structure Granulation Quality: Red, Pink, Hyper-granulation Fascia Exposed: No Necrotic Amount: Small (1-33%) Fat Layer (Subcutaneous Tissue) Exposed: Yes Necrotic Quality: Adherent Slough Tendon Exposed: No Muscle Exposed: No Joint Exposed: No Bone Exposed: No Treatment Notes Wound #7 (Left, Lateral, Plantar Foot) Notes Prisma Ag to left lateral plantar foot and right dorsal foot, bordered foam dressing, OFFLOADING shoe with peg assist Electronic Signature(s) WENDEL, HOMEYER ([id number]) Signed: 02/27/2020 3:22:47 PM By: Georges Mouse, Minus Breeding Entered By: Georges Mouse, Minus Breeding on 02/27/2020 14:05:50 [name] ([id number]) -------------------------------------------------------------------------------- Wound Assessment Details Patient Name: [name] Date of Service: 02/27/2020 1:30 PM Medical Record Number: [id number] Patient Account Number: [id number] Date of Birth/Sex: 10/17/1945 (74 y.o. M) Treating RN: Dolan Amen Primary Care Nillie Bartolotta: [name] Other Clinician: Referring Stevenson Windmiller: [name] Treating Akiya Morr/Extender: Tito Dine in Treatment: 12 Wound Status Wound Number: 9 Primary Diabetic Wound/Ulcer of the Lower Extremity Etiology: Wound Location: Right, Dorsal Foot Secondary Pressure Ulcer Wounding Event: Pressure Injury Etiology: Date Acquired: 01/23/2020 Wound Open Weeks Of Treatment: 3 Status: Clustered Wound: No Comorbid Cataracts, Middle ear problems, Sleep Apnea, History: Hypertension, Type II Diabetes, Gout, Osteoarthritis, Neuropathy, Received Chemotherapy Photos Wound Measurements Length: (cm) 1 % Redu Width: (cm) 0.8 % Redu Depth: (cm) 0.4 Epithe Area: (cm) 0.628 Under Volume: (cm) 0.251 St End Max ction in Area: -100% ction in Volume: -709.7% lialization: Small (1-33%) mining: Yes arting Position  (o'clock): 12 ing Position (o'clock): 12 imum Distance: (cm) 0.5 Wound Description Classification: Grade 2 Foul O Wound Margin: Flat and Intact Slough Exudate Amount: Medium Exudate Type: Serosanguineous Exudate Color: red, brown dor After Cleansing: No /Fibrino Yes Wound Bed Granulation Amount: Small (1-33%) Exposed Structure Granulation Quality: Pink Fascia Exposed: No Necrotic Amount: Large (67-100%) Fat Layer (Subcutaneous Tissue) Exposed: Yes Necrotic Quality: Adherent Slough Tendon Exposed:  No Muscle Exposed: No Joint Exposed: No Bone Exposed: No Treatment Notes Wound #9 (Right, Dorsal Foot) KADAR, CHANCE ([id number]) Notes Prisma Ag to left lateral plantar foot and right dorsal foot, bordered foam dressing, OFFLOADING shoe with peg assist Electronic Signature(s) Signed: 02/27/2020 3:22:47 PM By: Charlett Nose Signed: 02/28/2020 5:12:38 PM By: Gretta Cool, BSN, RN, CWS, Kim RN, BSN Entered By: Gretta Cool, BSN, RN, CWS, Kim on 02/27/2020 14:15:38 [name] ([id number]) -------------------------------------------------------------------------------- [place] Details Patient Name: [name] Date of Service: 02/27/2020 1:30 PM Medical Record Number: [id number] Patient Account Number: [id number] Date of Birth/Sex: 04/24/1945 (74 y.o. M) Treating RN: Dolan Amen Primary Care Alakai Macbride: [name] Other Clinician: Referring Tamma Brigandi: [name] Treating Yarelly Kuba/Extender: Tito Dine in Treatment: 12 Vital Signs Time Taken: 13:50 Temperature (F): 97.6 Height (in): 71 Pulse (bpm): 97 Weight (lbs): 190 Respiratory Rate (breaths/min): 18 Body Mass Index (BMI): 26.5 Blood Pressure (mmHg): 106/64 Reference Range: 80 - 120 mg / dl Electronic Signature(s) Signed: 02/27/2020 3:22:47 PM By: Georges Mouse, Minus Breeding Entered By: Georges Mouse, Minus Breeding on 02/27/2020 13:54:04

## 2020-03-04 NOTE — Patient Instructions (Signed)
[facility] Discharge Instructions for Patients Receiving Chemotherapy  Today you received the following chemotherapy agents Etoposide  To help prevent nausea and vomiting after your treatment, we encourage you to take your nausea medication as directed.    If you develop nausea and vomiting that is not controlled by your nausea medication, call the clinic.   BELOW ARE SYMPTOMS THAT SHOULD BE REPORTED IMMEDIATELY:  *FEVER GREATER THAN 100.5 F  *CHILLS WITH OR WITHOUT FEVER  NAUSEA AND VOMITING THAT IS NOT CONTROLLED WITH YOUR NAUSEA MEDICATION  *UNUSUAL SHORTNESS OF BREATH  *UNUSUAL BRUISING OR BLEEDING  TENDERNESS IN MOUTH AND THROAT WITH OR WITHOUT PRESENCE OF ULCERS  *URINARY PROBLEMS  *BOWEL PROBLEMS  UNUSUAL RASH Items with * indicate a potential emergency and should be followed up as soon as possible.  Feel free to call the clinic should you have any questions or concerns. The clinic phone number is [phone].  Please show the [place] at check-in to the Emergency Department and triage nurse.

## 2020-03-05 ENCOUNTER — Other Ambulatory Visit: Payer: Self-pay | Admitting: Internal Medicine

## 2020-03-05 ENCOUNTER — Inpatient Hospital Stay: Payer: Medicare Other

## 2020-03-05 ENCOUNTER — Other Ambulatory Visit: Payer: Self-pay

## 2020-03-05 VITALS — BP 127/76 | HR 90 | Temp 98.0°F | Resp 18

## 2020-03-05 DIAGNOSIS — Z5111 Encounter for antineoplastic chemotherapy: Secondary | ICD-10-CM | POA: Diagnosis not present

## 2020-03-05 DIAGNOSIS — E119 Type 2 diabetes mellitus without complications: Secondary | ICD-10-CM | POA: Diagnosis not present

## 2020-03-05 DIAGNOSIS — C3491 Malignant neoplasm of unspecified part of right bronchus or lung: Secondary | ICD-10-CM

## 2020-03-05 DIAGNOSIS — C3431 Malignant neoplasm of lower lobe, right bronchus or lung: Secondary | ICD-10-CM | POA: Diagnosis not present

## 2020-03-05 DIAGNOSIS — D6481 Anemia due to antineoplastic chemotherapy: Secondary | ICD-10-CM | POA: Diagnosis not present

## 2020-03-05 MED ORDER — SODIUM CHLORIDE 0.9 % IV SOLN
10.0000 mg | Freq: Once | INTRAVENOUS | Status: AC
  Administered 2020-03-05: 10 mg via INTRAVENOUS
  Filled 2020-03-05: qty 10

## 2020-03-05 MED ORDER — SODIUM CHLORIDE 0.9 % IV SOLN
Freq: Once | INTRAVENOUS | Status: AC
  Filled 2020-03-05: qty 250

## 2020-03-05 MED ORDER — HEPARIN SOD (PORK) LOCK FLUSH 100 UNIT/ML IV SOLN
500.0000 [IU] | Freq: Once | INTRAVENOUS | Status: AC | PRN
  Administered 2020-03-05: 500 [IU]
  Filled 2020-03-05: qty 5

## 2020-03-05 MED ORDER — SODIUM CHLORIDE 0.9 % IV SOLN
80.0000 mg/m2 | Freq: Once | INTRAVENOUS | Status: AC
  Administered 2020-03-05: 170 mg via INTRAVENOUS
  Filled 2020-03-05: qty 8.5

## 2020-03-05 MED ORDER — SODIUM CHLORIDE 0.9% FLUSH
10.0000 mL | INTRAVENOUS | Status: DC | PRN
  Administered 2020-03-05: 10 mL
  Filled 2020-03-05: qty 10

## 2020-03-05 NOTE — Patient Instructions (Signed)
[facility] Discharge Instructions for Patients Receiving Chemotherapy  Today you received the following chemotherapy agents: etoposide  To help prevent nausea and vomiting after your treatment, we encourage you to take your nausea medication as directed.   If you develop nausea and vomiting that is not controlled by your nausea medication, call the clinic.   BELOW ARE SYMPTOMS THAT SHOULD BE REPORTED IMMEDIATELY:  *FEVER GREATER THAN 100.5 F  *CHILLS WITH OR WITHOUT FEVER  NAUSEA AND VOMITING THAT IS NOT CONTROLLED WITH YOUR NAUSEA MEDICATION  *UNUSUAL SHORTNESS OF BREATH  *UNUSUAL BRUISING OR BLEEDING  TENDERNESS IN MOUTH AND THROAT WITH OR WITHOUT PRESENCE OF ULCERS  *URINARY PROBLEMS  *BOWEL PROBLEMS  UNUSUAL RASH Items with * indicate a potential emergency and should be followed up as soon as possible.  Feel free to call the clinic should you have any questions or concerns. The clinic phone number is [phone].  Please show the [facility] at check-in to the Emergency Department and triage nurse.   

## 2020-03-07 ENCOUNTER — Other Ambulatory Visit: Payer: Self-pay

## 2020-03-07 ENCOUNTER — Inpatient Hospital Stay: Payer: Medicare Other

## 2020-03-07 ENCOUNTER — Telehealth: Payer: Self-pay | Admitting: Internal Medicine

## 2020-03-07 VITALS — BP 128/64 | HR 89 | Temp 97.9°F

## 2020-03-07 DIAGNOSIS — D6481 Anemia due to antineoplastic chemotherapy: Secondary | ICD-10-CM | POA: Diagnosis not present

## 2020-03-07 DIAGNOSIS — C3491 Malignant neoplasm of unspecified part of right bronchus or lung: Secondary | ICD-10-CM

## 2020-03-07 DIAGNOSIS — E119 Type 2 diabetes mellitus without complications: Secondary | ICD-10-CM | POA: Diagnosis not present

## 2020-03-07 DIAGNOSIS — C3431 Malignant neoplasm of lower lobe, right bronchus or lung: Secondary | ICD-10-CM | POA: Diagnosis not present

## 2020-03-07 DIAGNOSIS — Z5111 Encounter for antineoplastic chemotherapy: Secondary | ICD-10-CM | POA: Diagnosis not present

## 2020-03-07 MED ORDER — PEGFILGRASTIM-CBQV 6 MG/0.6ML ~~LOC~~ SOSY
PREFILLED_SYRINGE | SUBCUTANEOUS | Status: AC
  Filled 2020-03-07: qty 0.6

## 2020-03-07 MED ORDER — PEGFILGRASTIM-CBQV 6 MG/0.6ML ~~LOC~~ SOSY
6.0000 mg | PREFILLED_SYRINGE | Freq: Once | SUBCUTANEOUS | Status: AC
  Administered 2020-03-07: 6 mg via SUBCUTANEOUS

## 2020-03-07 NOTE — Telephone Encounter (Signed)
Scheduled per los. Called and left msg.

## 2020-03-08 DIAGNOSIS — J45909 Unspecified asthma, uncomplicated: Secondary | ICD-10-CM | POA: Diagnosis not present

## 2020-03-08 DIAGNOSIS — N1831 Chronic kidney disease, stage 3a: Secondary | ICD-10-CM | POA: Diagnosis not present

## 2020-03-08 DIAGNOSIS — E11621 Type 2 diabetes mellitus with foot ulcer: Secondary | ICD-10-CM | POA: Diagnosis not present

## 2020-03-08 DIAGNOSIS — G4733 Obstructive sleep apnea (adult) (pediatric): Secondary | ICD-10-CM | POA: Diagnosis not present

## 2020-03-08 DIAGNOSIS — R000 Tachycardia, unspecified: Secondary | ICD-10-CM | POA: Diagnosis not present

## 2020-03-08 DIAGNOSIS — Z794 Long term (current) use of insulin: Secondary | ICD-10-CM | POA: Diagnosis not present

## 2020-03-08 DIAGNOSIS — C3491 Malignant neoplasm of unspecified part of right bronchus or lung: Secondary | ICD-10-CM | POA: Diagnosis not present

## 2020-03-08 DIAGNOSIS — E1122 Type 2 diabetes mellitus with diabetic chronic kidney disease: Secondary | ICD-10-CM | POA: Diagnosis not present

## 2020-03-08 DIAGNOSIS — Z9181 History of falling: Secondary | ICD-10-CM | POA: Diagnosis not present

## 2020-03-08 DIAGNOSIS — E1165 Type 2 diabetes mellitus with hyperglycemia: Secondary | ICD-10-CM | POA: Diagnosis not present

## 2020-03-08 DIAGNOSIS — E041 Nontoxic single thyroid nodule: Secondary | ICD-10-CM | POA: Diagnosis not present

## 2020-03-08 DIAGNOSIS — I129 Hypertensive chronic kidney disease with stage 1 through stage 4 chronic kidney disease, or unspecified chronic kidney disease: Secondary | ICD-10-CM | POA: Diagnosis not present

## 2020-03-08 DIAGNOSIS — E1142 Type 2 diabetes mellitus with diabetic polyneuropathy: Secondary | ICD-10-CM | POA: Diagnosis not present

## 2020-03-08 DIAGNOSIS — L97429 Non-pressure chronic ulcer of left heel and midfoot with unspecified severity: Secondary | ICD-10-CM | POA: Diagnosis not present

## 2020-03-08 DIAGNOSIS — Z87891 Personal history of nicotine dependence: Secondary | ICD-10-CM | POA: Diagnosis not present

## 2020-03-08 DIAGNOSIS — S2232XD Fracture of one rib, left side, subsequent encounter for fracture with routine healing: Secondary | ICD-10-CM | POA: Diagnosis not present

## 2020-03-10 DIAGNOSIS — E11621 Type 2 diabetes mellitus with foot ulcer: Secondary | ICD-10-CM | POA: Diagnosis not present

## 2020-03-10 DIAGNOSIS — E1165 Type 2 diabetes mellitus with hyperglycemia: Secondary | ICD-10-CM | POA: Diagnosis not present

## 2020-03-10 DIAGNOSIS — E1142 Type 2 diabetes mellitus with diabetic polyneuropathy: Secondary | ICD-10-CM | POA: Diagnosis not present

## 2020-03-10 DIAGNOSIS — J45909 Unspecified asthma, uncomplicated: Secondary | ICD-10-CM | POA: Diagnosis not present

## 2020-03-10 DIAGNOSIS — N1831 Chronic kidney disease, stage 3a: Secondary | ICD-10-CM | POA: Diagnosis not present

## 2020-03-10 DIAGNOSIS — I129 Hypertensive chronic kidney disease with stage 1 through stage 4 chronic kidney disease, or unspecified chronic kidney disease: Secondary | ICD-10-CM | POA: Diagnosis not present

## 2020-03-10 DIAGNOSIS — Z87891 Personal history of nicotine dependence: Secondary | ICD-10-CM | POA: Diagnosis not present

## 2020-03-10 DIAGNOSIS — Z9181 History of falling: Secondary | ICD-10-CM | POA: Diagnosis not present

## 2020-03-10 DIAGNOSIS — E041 Nontoxic single thyroid nodule: Secondary | ICD-10-CM | POA: Diagnosis not present

## 2020-03-10 DIAGNOSIS — Z794 Long term (current) use of insulin: Secondary | ICD-10-CM | POA: Diagnosis not present

## 2020-03-10 DIAGNOSIS — S2232XD Fracture of one rib, left side, subsequent encounter for fracture with routine healing: Secondary | ICD-10-CM | POA: Diagnosis not present

## 2020-03-10 DIAGNOSIS — C3491 Malignant neoplasm of unspecified part of right bronchus or lung: Secondary | ICD-10-CM | POA: Diagnosis not present

## 2020-03-10 DIAGNOSIS — L97429 Non-pressure chronic ulcer of left heel and midfoot with unspecified severity: Secondary | ICD-10-CM | POA: Diagnosis not present

## 2020-03-10 DIAGNOSIS — G4733 Obstructive sleep apnea (adult) (pediatric): Secondary | ICD-10-CM | POA: Diagnosis not present

## 2020-03-10 DIAGNOSIS — E1122 Type 2 diabetes mellitus with diabetic chronic kidney disease: Secondary | ICD-10-CM | POA: Diagnosis not present

## 2020-03-10 DIAGNOSIS — R000 Tachycardia, unspecified: Secondary | ICD-10-CM | POA: Diagnosis not present

## 2020-03-11 ENCOUNTER — Other Ambulatory Visit: Payer: Self-pay

## 2020-03-11 ENCOUNTER — Inpatient Hospital Stay: Payer: Medicare Other

## 2020-03-11 ENCOUNTER — Encounter (HOSPITAL_COMMUNITY): Payer: Self-pay

## 2020-03-11 ENCOUNTER — Other Ambulatory Visit: Payer: Self-pay | Admitting: Medical Oncology

## 2020-03-11 ENCOUNTER — Inpatient Hospital Stay (HOSPITAL_COMMUNITY)
Admission: EM | Admit: 2020-03-11 | Discharge: 2020-03-15 | DRG: 308 | Disposition: A | Discharge status: Home / Self Care | Payer: Medicare Other | Attending: Internal Medicine | Admitting: Internal Medicine

## 2020-03-11 ENCOUNTER — Telehealth: Payer: Self-pay

## 2020-03-11 DIAGNOSIS — Z9049 Acquired absence of other specified parts of digestive tract: Secondary | ICD-10-CM

## 2020-03-11 DIAGNOSIS — Z20822 Contact with and (suspected) exposure to covid-19: Secondary | ICD-10-CM | POA: Diagnosis present

## 2020-03-11 DIAGNOSIS — Z79899 Other long term (current) drug therapy: Secondary | ICD-10-CM | POA: Diagnosis not present

## 2020-03-11 DIAGNOSIS — Z923 Personal history of irradiation: Secondary | ICD-10-CM | POA: Diagnosis not present

## 2020-03-11 DIAGNOSIS — IMO0002 Reserved for concepts with insufficient information to code with codable children: Secondary | ICD-10-CM | POA: Diagnosis present

## 2020-03-11 DIAGNOSIS — N183 Chronic kidney disease, stage 3 unspecified: Secondary | ICD-10-CM | POA: Diagnosis present

## 2020-03-11 DIAGNOSIS — C3491 Malignant neoplasm of unspecified part of right bronchus or lung: Secondary | ICD-10-CM | POA: Diagnosis not present

## 2020-03-11 DIAGNOSIS — Z801 Family history of malignant neoplasm of trachea, bronchus and lung: Secondary | ICD-10-CM | POA: Diagnosis not present

## 2020-03-11 DIAGNOSIS — I10 Essential (primary) hypertension: Secondary | ICD-10-CM | POA: Diagnosis not present

## 2020-03-11 DIAGNOSIS — F32A Depression, unspecified: Secondary | ICD-10-CM | POA: Diagnosis present

## 2020-03-11 DIAGNOSIS — E0841 Diabetes mellitus due to underlying condition with diabetic mononeuropathy: Secondary | ICD-10-CM

## 2020-03-11 DIAGNOSIS — K219 Gastro-esophageal reflux disease without esophagitis: Secondary | ICD-10-CM | POA: Diagnosis not present

## 2020-03-11 DIAGNOSIS — D61810 Antineoplastic chemotherapy induced pancytopenia: Secondary | ICD-10-CM | POA: Diagnosis present

## 2020-03-11 DIAGNOSIS — E1122 Type 2 diabetes mellitus with diabetic chronic kidney disease: Secondary | ICD-10-CM | POA: Diagnosis not present

## 2020-03-11 DIAGNOSIS — N1831 Chronic kidney disease, stage 3a: Secondary | ICD-10-CM | POA: Diagnosis not present

## 2020-03-11 DIAGNOSIS — Z87891 Personal history of nicotine dependence: Secondary | ICD-10-CM

## 2020-03-11 DIAGNOSIS — E1142 Type 2 diabetes mellitus with diabetic polyneuropathy: Secondary | ICD-10-CM

## 2020-03-11 DIAGNOSIS — I2510 Atherosclerotic heart disease of native coronary artery without angina pectoris: Secondary | ICD-10-CM | POA: Diagnosis present

## 2020-03-11 DIAGNOSIS — T451X5A Adverse effect of antineoplastic and immunosuppressive drugs, initial encounter: Secondary | ICD-10-CM | POA: Diagnosis present

## 2020-03-11 DIAGNOSIS — E876 Hypokalemia: Secondary | ICD-10-CM | POA: Diagnosis not present

## 2020-03-11 DIAGNOSIS — E785 Hyperlipidemia, unspecified: Secondary | ICD-10-CM | POA: Diagnosis present

## 2020-03-11 DIAGNOSIS — D649 Anemia, unspecified: Secondary | ICD-10-CM

## 2020-03-11 DIAGNOSIS — Z794 Long term (current) use of insulin: Secondary | ICD-10-CM

## 2020-03-11 DIAGNOSIS — C3490 Malignant neoplasm of unspecified part of unspecified bronchus or lung: Secondary | ICD-10-CM

## 2020-03-11 DIAGNOSIS — I4892 Unspecified atrial flutter: Principal | ICD-10-CM | POA: Diagnosis present

## 2020-03-11 DIAGNOSIS — I4891 Unspecified atrial fibrillation: Secondary | ICD-10-CM | POA: Diagnosis present

## 2020-03-11 DIAGNOSIS — E1165 Type 2 diabetes mellitus with hyperglycemia: Secondary | ICD-10-CM | POA: Diagnosis not present

## 2020-03-11 DIAGNOSIS — Z7982 Long term (current) use of aspirin: Secondary | ICD-10-CM

## 2020-03-11 DIAGNOSIS — I129 Hypertensive chronic kidney disease with stage 1 through stage 4 chronic kidney disease, or unspecified chronic kidney disease: Secondary | ICD-10-CM | POA: Diagnosis not present

## 2020-03-11 DIAGNOSIS — M109 Gout, unspecified: Secondary | ICD-10-CM | POA: Diagnosis not present

## 2020-03-11 DIAGNOSIS — I483 Typical atrial flutter: Secondary | ICD-10-CM | POA: Diagnosis present

## 2020-03-11 LAB — CBC WITH DIFFERENTIAL (CANCER CENTER ONLY)
Abs Immature Granulocytes: 0.07 10*3/uL (ref 0.00–0.07)
Basophils Absolute: 0 10*3/uL (ref 0.0–0.1)
Basophils Relative: 1 %
Eosinophils Absolute: 0 10*3/uL (ref 0.0–0.5)
Eosinophils Relative: 0 %
HCT: 19.9 % — ABNORMAL LOW (ref 39.0–52.0)
Hemoglobin: 6.7 g/dL — CL (ref 13.0–17.0)
Immature Granulocytes: 3 %
Lymphocytes Relative: 3 %
Lymphs Abs: 0.1 10*3/uL — ABNORMAL LOW (ref 0.7–4.0)
MCH: 29.8 pg (ref 26.0–34.0)
MCHC: 33.7 g/dL (ref 30.0–36.0)
MCV: 88.4 fL (ref 80.0–100.0)
Monocytes Absolute: 0.2 10*3/uL (ref 0.1–1.0)
Monocytes Relative: 8 %
Neutro Abs: 2.4 10*3/uL (ref 1.7–7.7)
Neutrophils Relative %: 85 %
Platelet Count: 55 10*3/uL — ABNORMAL LOW (ref 150–400)
RBC: 2.25 MIL/uL — ABNORMAL LOW (ref 4.22–5.81)
RDW: 14.5 % (ref 11.5–15.5)
WBC Count: 2.8 10*3/uL — ABNORMAL LOW (ref 4.0–10.5)
nRBC: 0 % (ref 0.0–0.2)

## 2020-03-11 LAB — CBC
HCT: 21.8 % — ABNORMAL LOW (ref 39.0–52.0)
Hemoglobin: 7.2 g/dL — ABNORMAL LOW (ref 13.0–17.0)
MCH: 29.8 pg (ref 26.0–34.0)
MCHC: 33 g/dL (ref 30.0–36.0)
MCV: 90.1 fL (ref 80.0–100.0)
Platelets: 65 10*3/uL — ABNORMAL LOW (ref 150–400)
RBC: 2.42 MIL/uL — ABNORMAL LOW (ref 4.22–5.81)
RDW: 14.5 % (ref 11.5–15.5)
WBC: 3.3 10*3/uL — ABNORMAL LOW (ref 4.0–10.5)
nRBC: 0 % (ref 0.0–0.2)

## 2020-03-11 LAB — CMP (CANCER CENTER ONLY)
ALT: 7 U/L (ref 0–44)
AST: <6 U/L — ABNORMAL LOW (ref 15–41)
Albumin: 2.1 g/dL — ABNORMAL LOW (ref 3.5–5.0)
Alkaline Phosphatase: 100 U/L (ref 38–126)
Anion gap: 12 (ref 5–15)
BUN: 17 mg/dL (ref 8–23)
CO2: 24 mmol/L (ref 22–32)
Calcium: 8.3 mg/dL — ABNORMAL LOW (ref 8.9–10.3)
Chloride: 93 mmol/L — ABNORMAL LOW (ref 98–111)
Creatinine: 1.3 mg/dL — ABNORMAL HIGH (ref 0.61–1.24)
GFR, Estimated: 58 mL/min — ABNORMAL LOW (ref >60)
Glucose, Bld: 502 mg/dL (ref 70–99)
Potassium: 3.8 mmol/L (ref 3.5–5.1)
Sodium: 129 mmol/L — ABNORMAL LOW (ref 135–145)
Total Bilirubin: 0.7 mg/dL (ref 0.3–1.2)
Total Protein: 5.8 g/dL — ABNORMAL LOW (ref 6.5–8.1)

## 2020-03-11 LAB — BASIC METABOLIC PANEL
Anion gap: 9 (ref 5–15)
Anion gap: 11 (ref 5–15)
BUN: 19 mg/dL (ref 8–23)
BUN: 16 mg/dL (ref 8–23)
CO2: 25 mmol/L (ref 22–32)
CO2: 26 mmol/L (ref 22–32)
Calcium: 7.7 mg/dL — ABNORMAL LOW (ref 8.9–10.3)
Calcium: 7.6 mg/dL — ABNORMAL LOW (ref 8.9–10.3)
Chloride: 91 mmol/L — ABNORMAL LOW (ref 98–111)
Chloride: 94 mmol/L — ABNORMAL LOW (ref 98–111)
Creatinine, Ser: 1.4 mg/dL — ABNORMAL HIGH (ref 0.61–1.24)
Creatinine, Ser: 1.04 mg/dL (ref 0.61–1.24)
GFR, Estimated: 53 mL/min — ABNORMAL LOW (ref >60)
GFR, Estimated: >60 mL/min (ref >60)
Glucose, Bld: 522 mg/dL (ref 70–99)
Glucose, Bld: 139 mg/dL — ABNORMAL HIGH (ref 70–99)
Potassium: 3.3 mmol/L — ABNORMAL LOW (ref 3.5–5.1)
Potassium: 3.6 mmol/L (ref 3.5–5.1)
Sodium: 125 mmol/L — ABNORMAL LOW (ref 135–145)
Sodium: 131 mmol/L — ABNORMAL LOW (ref 135–145)

## 2020-03-11 LAB — CBG MONITORING, ED
Glucose-Capillary: 521 mg/dL (ref 70–99)
Glucose-Capillary: 219 mg/dL — ABNORMAL HIGH (ref 70–99)
Glucose-Capillary: 69 mg/dL — ABNORMAL LOW (ref 70–99)
Glucose-Capillary: 91 mg/dL (ref 70–99)
Glucose-Capillary: 133 mg/dL — ABNORMAL HIGH (ref 70–99)

## 2020-03-11 LAB — URINALYSIS, ROUTINE W REFLEX MICROSCOPIC
Bilirubin Urine: NEGATIVE
Glucose, UA: >=500 mg/dL — AB
Ketones, ur: NEGATIVE mg/dL
Leukocytes,Ua: NEGATIVE
Nitrite: NEGATIVE
Protein, ur: 100 mg/dL — AB
Specific Gravity, Urine: 1.012 (ref 1.005–1.030)
pH: 5 (ref 5.0–8.0)

## 2020-03-11 LAB — MAGNESIUM: Magnesium: 1.2 mg/dL — ABNORMAL LOW (ref 1.7–2.4)

## 2020-03-11 LAB — SAMPLE TO BLOOD BANK

## 2020-03-11 LAB — TSH: TSH: 1.077 u[IU]/mL (ref 0.350–4.500)

## 2020-03-11 LAB — RESP PANEL BY RT-PCR (FLU A&B, COVID) ARPGX2
Influenza A by PCR: NEGATIVE
Influenza B by PCR: NEGATIVE
SARS Coronavirus 2 by RT PCR: NEGATIVE

## 2020-03-11 LAB — PREPARE RBC (CROSSMATCH)

## 2020-03-11 MED ORDER — METOPROLOL TARTRATE 25 MG PO TABS
25.0000 mg | ORAL_TABLET | Freq: Once | ORAL | Status: AC
  Administered 2020-03-11: 25 mg via ORAL
  Filled 2020-03-11: qty 1

## 2020-03-11 MED ORDER — SIMVASTATIN 40 MG PO TABS
40.0000 mg | ORAL_TABLET | Freq: Every day | ORAL | Status: DC
  Administered 2020-03-11 – 2020-03-14 (×4): 40 mg via ORAL
  Filled 2020-03-11 (×3): qty 1
  Filled 2020-03-11: qty 2

## 2020-03-11 MED ORDER — SODIUM CHLORIDE 0.9% FLUSH
10.0000 mL | INTRAVENOUS | Status: DC | PRN
  Administered 2020-03-11: 10 mL
  Filled 2020-03-11: qty 10

## 2020-03-11 MED ORDER — INSULIN ASPART 100 UNIT/ML ~~LOC~~ SOLN
0.0000 [IU] | Freq: Three times a day (TID) | SUBCUTANEOUS | Status: DC
  Administered 2020-03-12: 4 [IU] via SUBCUTANEOUS
  Administered 2020-03-12 – 2020-03-13 (×2): 11 [IU] via SUBCUTANEOUS
  Administered 2020-03-13: 7 [IU] via SUBCUTANEOUS
  Administered 2020-03-14: 3 [IU] via SUBCUTANEOUS
  Administered 2020-03-14: 11 [IU] via SUBCUTANEOUS
  Administered 2020-03-15: 7 [IU] via SUBCUTANEOUS
  Administered 2020-03-15: 11 [IU] via SUBCUTANEOUS
  Filled 2020-03-11: qty 0.2

## 2020-03-11 MED ORDER — SUCRALFATE 1 G PO TABS
1.0000 g | ORAL_TABLET | Freq: Three times a day (TID) | ORAL | Status: DC
  Administered 2020-03-11 – 2020-03-15 (×15): 1 g via ORAL
  Filled 2020-03-11 (×15): qty 1

## 2020-03-11 MED ORDER — INSULIN ASPART 100 UNIT/ML ~~LOC~~ SOLN
0.0000 [IU] | Freq: Every day | SUBCUTANEOUS | Status: DC
  Administered 2020-03-14: 2 [IU] via SUBCUTANEOUS
  Filled 2020-03-11: qty 0.05

## 2020-03-11 MED ORDER — FLUTICASONE PROPIONATE 50 MCG/ACT NA SUSP
2.0000 | Freq: Every day | NASAL | Status: DC
  Administered 2020-03-11 – 2020-03-15 (×4): 2 via NASAL
  Filled 2020-03-11: qty 16

## 2020-03-11 MED ORDER — VENLAFAXINE HCL ER 75 MG PO CP24
75.0000 mg | ORAL_CAPSULE | Freq: Every day | ORAL | Status: DC
  Administered 2020-03-12 – 2020-03-15 (×4): 75 mg via ORAL
  Filled 2020-03-11 (×4): qty 1

## 2020-03-11 MED ORDER — LOPERAMIDE HCL 2 MG PO CAPS
2.0000 mg | ORAL_CAPSULE | Freq: Once | ORAL | Status: AC
  Administered 2020-03-11: 2 mg via ORAL
  Filled 2020-03-11: qty 1

## 2020-03-11 MED ORDER — PROCHLORPERAZINE MALEATE 10 MG PO TABS: 10.0000 mg | ORAL_TABLET | Freq: Four times a day (QID) | ORAL | Status: DC | PRN

## 2020-03-11 MED ORDER — METOPROLOL SUCCINATE ER 25 MG PO TB24: 12.5000 mg | ORAL_TABLET | Freq: Every day | ORAL | 0 refills | Status: DC

## 2020-03-11 MED ORDER — METOPROLOL TARTRATE 5 MG/5ML IV SOLN
5.0000 mg | Freq: Once | INTRAVENOUS | Status: AC
  Administered 2020-03-11: 5 mg via INTRAVENOUS
  Filled 2020-03-11: qty 5

## 2020-03-11 MED ORDER — PANTOPRAZOLE SODIUM 20 MG PO TBEC
20.0000 mg | DELAYED_RELEASE_TABLET | Freq: Two times a day (BID) | ORAL | Status: DC
  Administered 2020-03-11 – 2020-03-15 (×8): 20 mg via ORAL
  Filled 2020-03-11 (×8): qty 1

## 2020-03-11 MED ORDER — MEMANTINE HCL 10 MG PO TABS
10.0000 mg | ORAL_TABLET | Freq: Two times a day (BID) | ORAL | Status: DC
  Administered 2020-03-11 – 2020-03-15 (×8): 10 mg via ORAL
  Filled 2020-03-11: qty 1
  Filled 2020-03-11: qty 2
  Filled 2020-03-11 (×6): qty 1

## 2020-03-11 MED ORDER — INSULIN GLARGINE 100 UNIT/ML ~~LOC~~ SOLN
10.0000 [IU] | Freq: Every day | SUBCUTANEOUS | Status: DC
  Administered 2020-03-12 – 2020-03-14 (×3): 10 [IU] via SUBCUTANEOUS
  Filled 2020-03-11 (×3): qty 0.1

## 2020-03-11 MED ORDER — DILTIAZEM LOAD VIA INFUSION
15.0000 mg | Freq: Once | INTRAVENOUS | Status: AC
  Administered 2020-03-11: 15 mg via INTRAVENOUS
  Filled 2020-03-11: qty 15

## 2020-03-11 MED ORDER — ONDANSETRON HCL 4 MG/2ML IJ SOLN: 4.0000 mg | Freq: Four times a day (QID) | INTRAMUSCULAR | Status: DC | PRN

## 2020-03-11 MED ORDER — HEPARIN SOD (PORK) LOCK FLUSH 100 UNIT/ML IV SOLN
500.0000 [IU] | Freq: Once | INTRAVENOUS | Status: AC | PRN
  Administered 2020-03-11: 500 [IU]
  Filled 2020-03-11: qty 5

## 2020-03-11 MED ORDER — SODIUM CHLORIDE 0.9% IV SOLUTION: Freq: Once | INTRAVENOUS | Status: AC

## 2020-03-11 MED ORDER — LACTATED RINGERS IV BOLUS
2000.0000 mL | Freq: Once | INTRAVENOUS | Status: AC
  Administered 2020-03-11: 2000 mL via INTRAVENOUS

## 2020-03-11 MED ORDER — INSULIN ASPART 100 UNIT/ML ~~LOC~~ SOLN
10.0000 [IU] | Freq: Three times a day (TID) | SUBCUTANEOUS | Status: DC
  Administered 2020-03-12 – 2020-03-14 (×6): 10 [IU] via SUBCUTANEOUS
  Filled 2020-03-11: qty 0.1

## 2020-03-11 MED ORDER — POTASSIUM CHLORIDE CRYS ER 20 MEQ PO TBCR
40.0000 meq | EXTENDED_RELEASE_TABLET | Freq: Once | ORAL | Status: AC
  Administered 2020-03-11: 40 meq via ORAL
  Filled 2020-03-11: qty 2

## 2020-03-11 MED ORDER — ACETAMINOPHEN 325 MG PO TABS: 650.0000 mg | ORAL_TABLET | ORAL | Status: DC | PRN

## 2020-03-11 MED ORDER — VENLAFAXINE HCL ER 150 MG PO CP24
150.0000 mg | ORAL_CAPSULE | Freq: Every day | ORAL | Status: DC
  Administered 2020-03-11 – 2020-03-14 (×4): 150 mg via ORAL
  Filled 2020-03-11 (×3): qty 1
  Filled 2020-03-11: qty 2

## 2020-03-11 MED ORDER — DONEPEZIL HCL 10 MG PO TABS
10.0000 mg | ORAL_TABLET | Freq: Every day | ORAL | Status: DC
  Administered 2020-03-11 – 2020-03-14 (×4): 10 mg via ORAL
  Filled 2020-03-11 (×3): qty 1
  Filled 2020-03-11: qty 2

## 2020-03-11 MED ORDER — INSULIN ASPART 100 UNIT/ML IV SOLN
10.0000 [IU] | Freq: Once | INTRAVENOUS | Status: DC
  Filled 2020-03-11: qty 0.1

## 2020-03-11 MED ORDER — DILTIAZEM HCL-DEXTROSE 125-5 MG/125ML-% IV SOLN (PREMIX)
5.0000 mg/h | INTRAVENOUS | Status: DC
  Administered 2020-03-11 – 2020-03-12 (×2): 5 mg/h via INTRAVENOUS
  Administered 2020-03-12: 10 mg/h via INTRAVENOUS
  Filled 2020-03-11 (×3): qty 125

## 2020-03-11 MED ORDER — MONTELUKAST SODIUM 10 MG PO TABS
10.0000 mg | ORAL_TABLET | Freq: Every day | ORAL | Status: DC
  Administered 2020-03-11 – 2020-03-14 (×4): 10 mg via ORAL
  Filled 2020-03-11 (×4): qty 1

## 2020-03-11 MED ORDER — ALBUTEROL SULFATE HFA 108 (90 BASE) MCG/ACT IN AERS: 1.0000 | INHALATION_SPRAY | Freq: Four times a day (QID) | RESPIRATORY_TRACT | Status: DC | PRN

## 2020-03-11 NOTE — Telephone Encounter (Signed)
Hyperglycemia and anemia Pt notified of Glucose 502 and hgb - 6.7 Per [name] ,  I instructed him to go to ED now and have someone drive him. He said he will go. Pt said he did not take his insulin today.

## 2020-03-11 NOTE — ED Notes (Signed)
Attempted to collect urine sample. Patient states he is not able to use the urinal. He would like to wait and try to go to the restroom after his IV finishes.

## 2020-03-11 NOTE — ED Provider Notes (Signed)
[facility] DEPT Provider Note   CSN: [id number] Arrival date & time: 03/11/20  1206     History Chief Complaint  Patient presents with  . Hyperglycemia  . Abnormal Lab    [name] is a 74 y.o. male.  74 yo M with a chief complaint of fatigue and shortness of breath.  This is been going on for the past few days.  He is undergoing chemotherapy and typically has problems with his blood sugar afterwards.  Had lab work done today that showed that he has also anemic.  Hemoglobin of 6.7.  Was sent to the ED for transfusion.  Patient denies any chest pain.  Has had some trouble breathing and lightheadedness that chronic but has worsened over the past few days.  Denies dark stool or blood in stool.  Denies blood thinner use.  The history is provided by the patient.  Hyperglycemia Severity:  Severe Onset quality:  Gradual Duration:  2 days Timing:  Constant Progression:  Worsening Chronicity:  New Relieved by:  Nothing Ineffective treatments:  None tried Associated symptoms: fatigue and shortness of breath   Associated symptoms: no abdominal pain, no chest pain, no confusion, no fever and no vomiting   Abnormal Lab      Past Medical History:  Diagnosis Date  . Allergic rhinitis due to pollen   . Chronic kidney disease, stage III (moderate) ([place]) 12/19/2012  . Depression   . Diabetes mellitus ([place])   . Diverticulosis   . Erectile dysfunction associated with type 2 diabetes mellitus ([place]) 05/28/2015  . Former very heavy cigarette smoker (more than 40 per day) 10/07/2014  . GERD (gastroesophageal reflux disease)   . Gout   . Hyperlipidemia   . Hypertension   . Hypogonadism male 06/14/2012  . Memory loss   . Non-small cell carcinoma of right lung, stage 3 ([place]) 10/23/2019  . Osteoarthritis   . Personal history of colonic polyps    1996    Patient Active Problem List   Diagnosis Date Noted  . Community acquired pneumonia of right lower lobe  of lung 02/22/2020  . Severe protein-calorie malnutrition ([place]) 02/12/2020  . Chemotherapy induced neutropenia ([place])   . Severe sepsis ([place]) 11/22/2019  . Neutropenic fever ([place]) 11/22/2019  . Non-small cell carcinoma of right lung, stage 3 ([place]) 10/23/2019  . Goals of care, counseling/discussion 10/23/2019  . Encounter for antineoplastic chemotherapy 10/23/2019  . Small cell lung cancer ([place]) 10/12/2019  . Dropfoot 03/15/2019  . Mild cognitive impairment 05/08/2018  . Esophageal dysphagia   . Diabetic foot ulcer ([place]) 06/10/2017  . Uncontrolled type 2 diabetes mellitus with peripheral neuropathy ([place]) 05/11/2016  . Erectile dysfunction associated with type 2 diabetes mellitus ([place]) 05/28/2015  . Former very heavy cigarette smoker (more than 40 per day) 10/07/2014  . Chronic kidney disease, stage III (moderate) ([place]) 12/19/2012  . Hypogonadism male 06/14/2012  . OSA (obstructive sleep apnea) 03/09/2011  . Diabetic neuropathy ([place]) 12/29/2010  . Family history of MI (myocardial infarction) 09/16/2010  . HIP REPLACEMENT, RIGHT, HX OF 02/05/2010  . Hypertriglyceridemia 06/13/2008  . GOUT 06/13/2008  . Major depressive disorder, recurrent episode, in partial remission ([place]) 06/13/2008  . Essential hypertension 06/13/2008  . GERD 06/13/2008  . OSTEOARTHRITIS 06/13/2008    Past Surgical History:  Procedure Laterality Date  . BRONCHIAL NEEDLE ASPIRATION BIOPSY  10/17/2019   Procedure: BRONCHIAL NEEDLE ASPIRATION BIOPSIES;  Surgeon: Juanito Doom, MD;  Location: [facility];  Service: Cardiopulmonary;;  .  CARDIAC CATHETERIZATION  11/2011   [facility]  . CHOLECYSTECTOMY  1980  . ENDOBRONCHIAL ULTRASOUND Bilateral 10/17/2019   Procedure: ENDOBRONCHIAL ULTRASOUND;  Surgeon: Juanito Doom, MD;  Location: [facility];  Service: Cardiopulmonary;  Laterality: Bilateral;  . ESOPHAGOGASTRODUODENOSCOPY (EGD) WITH PROPOFOL N/A 09/27/2017   Procedure: ESOPHAGOGASTRODUODENOSCOPY (EGD) WITH  PROPOFOL;  Surgeon: Lin Landsman, MD;  Location: [facility];  Service: Gastroenterology;  Laterality: N/A;  . EYE SURGERY Right 07/29/2016   [facility] - Dr. Manuella Ghazi  . EYE SURGERY Left 08/23/2106   [facility] - Dr. Manuella Ghazi  . HEMOSTASIS CONTROL  10/17/2019   Procedure: HEMOSTASIS CONTROL;  Surgeon: Juanito Doom, MD;  Location: [facility];  Service: Cardiopulmonary;;  . IR IMAGING GUIDED PORT INSERTION  11/30/2019  . IRRIGATION AND DEBRIDEMENT ABSCESS Left 11/23/2019   Procedure: IRRIGATION AND DEBRIDEMENT ABSCESS;  Surgeon: Melina Schools, MD;  Location: [facility];  Service: Orthopedics;  Laterality: Left;  . TOTAL HIP ARTHROPLASTY  2009   Dr. Gladstone Lighter  . TOTAL HIP ARTHROPLASTY         Family History  Problem Relation Age of Onset  . Diabetes Mother   . Alzheimer's disease Mother   . Diabetes Father   . Lung cancer Father        Age 28  . Stroke Father   . Heart attack Father   . Lung cancer Sister        lung  . Heart disease Maternal Grandfather   . COPD Sister   . Heart attack Sister   . Heart disease Sister     Social History   Tobacco Use  . Smoking status: Former Smoker    Packs/day: 3.00    Years: 35.00    Pack years: 105.00    Types: Cigarettes    Quit date: 04/12/2000    Years since quitting: 19.9  . Smokeless tobacco: Never Used  Vaping Use  . Vaping Use: Never used  Substance Use Topics  . Alcohol use: No  . Drug use: No    Home Medications Prior to Admission medications   Medication Sig Start Date End Date Taking? Authorizing Provider  acetaminophen (QC ACETAMINOPHEN 8HR ARTH PAIN) 650 MG CR tablet Take 650 mg by mouth every 8 (eight) hours as needed for pain.    [provider]  albuterol (VENTOLIN HFA) 108 (90 Base) MCG/ACT inhaler Inhale 1-2 puffs into the lungs every 6 (six) hours as needed for wheezing or shortness of breath. 02/15/20   Tanner, Lyndon Code., [name]  ampicillin (PRINCIPEN) 500 MG capsule Take 500 mg by mouth daily as needed  (flare up).  10/24/19   [provider]  Ascorbic Acid (VITAMIN C) 1000 MG tablet Take 1,000 mg by mouth daily.    [provider]  aspirin EC 81 MG tablet Take 81 mg by mouth daily. Swallow whole.    [provider]  cholecalciferol (VITAMIN D) 1000 UNITS tablet Take 2,000 Units by mouth daily.     [provider]  donepezil (ARICEPT) 10 MG tablet TAKE 1 TABLET BY MOUTH AT BEDTIME 08/24/19   Copland, Frederico Hamman, MD  fluticasone (FLONASE) 50 MCG/ACT nasal spray PLACE 2 SPRAYS IN EACH NOSTRIL DAILY 01/26/19   Copland, Frederico Hamman, MD  Hyprom-Naphaz-Polysorb-Zn Sulf (CLEAR EYES COMPLETE OP) Place 1 drop into both eyes daily as needed (dry eyes).    [provider]  Insulin Pen Needle (PEN NEEDLES) 31G X 5 MM MISC 1 each by Does not apply route 3 (three) times daily. To  inject insulin E11.42 09/20/19   Philemon Kingdom, MD  insulin regular human CONCENTRATED (HUMULIN R U-500 KWIKPEN) 500 UNIT/ML kwikpen Inject 40 Units into the skin with breakfast, with lunch, and with evening meal.    [provider]  memantine (NAMENDA) 10 MG tablet TAKE 1 TABLET BY MOUTH TWICE A DAY 09/20/19   Copland, Frederico Hamman, MD  montelukast (SINGULAIR) 10 MG tablet Take 1 tablet (10 mg total) by mouth at bedtime. 09/20/19   Bedsole, Amy E, MD  pantoprazole (PROTONIX) 20 MG tablet Take 1 tablet (20 mg total) by mouth daily. 01/21/20   Heilingoetter, Cassandra L, [name]  potassium chloride SA (KLOR-CON) 20 MEQ tablet Take 1 tablet (20 mEq total) by mouth 2 (two) times daily. 02/04/20   Curt Bears, MD  prochlorperazine (COMPAZINE) 10 MG tablet Take 1 tablet (10 mg total) by mouth every 6 (six) hours as needed. 10/23/19   Heilingoetter, Cassandra L, [name]  simvastatin (ZOCOR) 40 MG tablet TAKE 1 TABLET BY MOUTH AT BEDTIME 04/30/19   Copland, Frederico Hamman, MD  sucralfate (CARAFATE) 1 g tablet Take 1 tablet (1 g total) by mouth 4 (four) times daily. Dissolve each tablet in 15 cc water before use.  11/02/19   Kyung Rudd, MD  venlafaxine XR (EFFEXOR-XR) 75 MG 24 hr capsule TAKE 1 CAPSULE BY MOUTH EVERY MORNING AND 2 CAPSULES AT BEDTIME 11/26/19   Copland, Frederico Hamman, MD    Allergies    Tetracycline  Review of Systems   Review of Systems  Constitutional: Positive for fatigue. Negative for chills and fever.  HENT: Negative for congestion and facial swelling.   Eyes: Negative for discharge and visual disturbance.  Respiratory: Positive for shortness of breath.   Cardiovascular: Positive for palpitations. Negative for chest pain.  Gastrointestinal: Negative for abdominal pain, diarrhea and vomiting.  Musculoskeletal: Negative for arthralgias and myalgias.  Skin: Negative for color change and rash.  Neurological: Negative for tremors, syncope and headaches.  Psychiatric/Behavioral: Negative for confusion and dysphoric mood.    Physical Exam Updated Vital Signs BP 108/69 (BP Location: Left Arm)   Pulse (!) 154   Temp 98.3 F (36.8 C) (Oral)   Resp 19   Ht 5\' 11"  (1.803 m)   Wt 82.6 kg   SpO2 100%   BMI 25.38 kg/m   Physical Exam Vitals and nursing note reviewed.  Constitutional:      Appearance: He is well-developed.     Comments: pale  HENT:     Head: Normocephalic and atraumatic.  Eyes:     Pupils: Pupils are equal, round, and reactive to light.  Neck:     Vascular: No JVD.  Cardiovascular:     Rate and Rhythm: Regular rhythm. Tachycardia present.     Heart sounds: No murmur heard.  No friction rub. No gallop.   Pulmonary:     Effort: No respiratory distress.     Breath sounds: No wheezing.  Abdominal:     General: There is no distension.     Tenderness: There is no guarding or rebound.  Musculoskeletal:        General: Normal range of motion.     Cervical back: Normal range of motion and neck supple.  Skin:    Coloration: Skin is not pale.     Findings: No rash.  Neurological:     Mental Status: He is alert and oriented to person, place, and time.   Psychiatric:        Behavior: Behavior normal.  ED Results / Procedures / Treatments   Labs (all labs ordered are listed, but only abnormal results are displayed) Labs Reviewed  CBG MONITORING, ED - Abnormal; Notable for the following components:      Result Value   Glucose-Capillary 521 (*)    All other components within normal limits  RESP PANEL BY RT-PCR (FLU A&B, COVID) ARPGX2  BASIC METABOLIC PANEL  CBC  URINALYSIS, ROUTINE W REFLEX MICROSCOPIC  PREPARE RBC (CROSSMATCH)    EKG None  Radiology No results found.  Procedures Procedures (including critical care time)  Medications Ordered in ED Medications  0.9 %  sodium chloride infusion (Manually program via Guardrails IV Fluids) (has no administration in time range)  lactated ringers bolus 2,000 mL (2,000 mLs Intravenous New Bag/Given 03/11/20 1302)    ED Course  I have reviewed the triage vital signs and the nursing notes.  Pertinent labs & imaging results that were available during my care of the patient were reviewed by me and considered in my medical decision making (see chart for details).    MDM Rules/Calculators/A&P                          74 yo M with a chief complaint with chief complaints of fatigue.  Found to be acutely anemic by his oncologist sent here for transfusion.  The patient is significantly tachycardic on arrival with heart rates going into the 170s with minimal exertion.  Appears to be likely sinus tachycardia.  Will give 2 L of IV fluids give a unit of blood.  Patient strongly  wishes to go home.  Will reassess heart rate.  Patient's heart rate has improved somewhat with a dose of metoprolol.  Now appears to be in a 4:1 block likely atrial flutter.  I discussed the risk of going home with new onset atrial flutter, discussed the risk of stroke and my apprehension to start him on anticoagulation with acute anemia.  We will have him follow-up with the [facility] fib clinic.  CHA2DS2/VAS Stroke Risk  Points  Current as of 7 minutes ago     ? >= 2 Points: High Risk  1 - 1.99 Points: Medium Risk  0 Points: Low Risk    Last Change: N/A      Details    This score determines the patient's risk of having a stroke if the  patient has atrial fibrillation.       Points Metrics  0 Has Congestive Heart Failure:  No    Current as of 7 minutes ago  0 Has Vascular Disease:  No    Current as of 7 minutes ago  1 Has Hypertension:  Yes    Current as of 7 minutes ago  1 Age:  24    Current as of 7 minutes ago  1 Has Diabetes:  Yes    Current as of 7 minutes ago  0 Had Stroke:  No  Had TIA:  No  Had thromboembolism:  No    Current as of 7 minutes ago  0 Male:  No    Current as of 7 minutes ago            CRITICAL CARE Performed by: Cecilio Asper   Total critical care time: 35 minutes  Critical care time was exclusive of separately billable procedures and treating other patients.  Critical care was necessary to treat or prevent imminent or life-threatening deterioration.  Critical care was time  spent personally by me on the following activities: development of treatment plan with patient and/or surrogate as well as nursing, discussions with consultants, evaluation of patient's response to treatment, examination of patient, obtaining history from patient or surrogate, ordering and performing treatments and interventions, ordering and review of laboratory studies, ordering and review of radiographic studies, pulse oximetry and re-evaluation of patient's condition.  The patients results and plan were reviewed and discussed.   Any x-rays performed were independently reviewed by myself.   Differential diagnosis were considered with the presenting HPI.  Medications  0.9 %  sodium chloride infusion (Manually program via Guardrails IV Fluids) (has no administration in time range)  lactated ringers bolus 2,000 mL (0 mLs Intravenous Stopped 03/11/20 1536)  metoprolol tartrate  (LOPRESSOR) injection 5 mg (5 mg Intravenous Given 03/11/20 1601)    Vitals:   03/11/20 1530 03/11/20 1606 03/11/20 1630 03/11/20 1654  BP: 106/65 113/74 114/62 114/62  Pulse:  90 99 (!) 116  Resp: (!) 26 20 16 20   Temp:    98.1 F (36.7 C)  TempSrc:    Oral  SpO2: 96% 99% 98%   Weight:      Height:        Final diagnoses:  Atrial flutter with rapid ventricular response (HCC)  Symptomatic anemia      Final Clinical Impression(s) / ED Diagnoses Final diagnoses:  None    Rx / DC Orders ED Discharge Orders    None       Deno Etienne, DO 03/11/20 1700

## 2020-03-11 NOTE — H&P (Signed)
History and Physical    [name] DOB: 05/26/1945 DOA: 03/11/2020  PCP: Dustin Loffler, [name]  Patient coming from: Home  I have personally briefly reviewed patient's old medical records in [place]  Chief Complaint: Anemia  HPI: [name] is a 74 y.o. male with medical history significant of DM2, CKD3, HTN, limited stage SCLC (incorrectly labeled at various points in patients chart as NSCLC).  Pt undergoing chemotherapy for his SCLC.  Pt sent in to ED today after outpt labs showing HGB 6.7 earlier today.  1u PRBC transfusion ordered.  While in the ED he was found to have new onset A.Flutter with a HR of 150.  Platelets of 65.  BGL of 522 though because he took insulin prior to coming to ED this has trended down during ED stay to 69 with most recent BGL of 91.  Pt typically has problems with BGLs following chemotherapy.  Pt has had some trouble breathing and lightheadedness.  This is chronic but has been worse over past couple of days.  Pt denies any dark stool, blood in stool.  Pt denies any CP.  No blood thinner use.   ED Course: Multiple rounds of metoprolol failed to control HR.  HR ultimately improved with cardizem bolus and gtt.  1u PRBC transfusion completed.  WBC 3.3k, HGB 7.2k, platelets 65 (all pre-transfusion).  Sodium 125 and K 3.3.   Review of Systems: As per HPI, otherwise all review of systems negative.  Past Medical History:  Diagnosis Date  . Allergic rhinitis due to pollen   . Chronic kidney disease, stage III (moderate) ([place]) 12/19/2012  . Depression   . Diabetes mellitus ([place])   . Diverticulosis   . Erectile dysfunction associated with type 2 diabetes mellitus ([place]) 05/28/2015  . Former very heavy cigarette smoker (more than 40 per day) 10/07/2014  . GERD (gastroesophageal reflux disease)   . Gout   . Hyperlipidemia   . Hypertension   . Hypogonadism male 06/14/2012  . Memory loss   . Osteoarthritis   . Personal history of  colonic polyps    1996  . Small cell carcinoma of right lung ([place]) 10/23/2019    Past Surgical History:  Procedure Laterality Date  . BRONCHIAL NEEDLE ASPIRATION BIOPSY  10/17/2019   Procedure: BRONCHIAL NEEDLE ASPIRATION BIOPSIES;  Surgeon: Dustin Doom, [name];  Location: [facility];  Service: Cardiopulmonary;;  . CARDIAC CATHETERIZATION  11/2011   [facility]  . CHOLECYSTECTOMY  1980  . ENDOBRONCHIAL ULTRASOUND Bilateral 10/17/2019   Procedure: ENDOBRONCHIAL ULTRASOUND;  Surgeon: Dustin Doom, [name];  Location: [facility];  Service: Cardiopulmonary;  Laterality: Bilateral;  . ESOPHAGOGASTRODUODENOSCOPY (EGD) WITH PROPOFOL N/A 09/27/2017   Procedure: ESOPHAGOGASTRODUODENOSCOPY (EGD) WITH PROPOFOL;  Surgeon: Dustin Landsman, [name];  Location: [facility];  Service: Gastroenterology;  Laterality: N/A;  . EYE SURGERY Right 07/29/2016   cataract - Dr. Manuella [name]  . EYE SURGERY Left 08/23/2106   cataract - Dr. Manuella [name]  . HEMOSTASIS CONTROL  10/17/2019   Procedure: HEMOSTASIS CONTROL;  Surgeon: Dustin Doom, [name];  Location: [facility];  Service: Cardiopulmonary;;  . IR IMAGING GUIDED PORT INSERTION  11/30/2019  . IRRIGATION AND DEBRIDEMENT ABSCESS Left 11/23/2019   Procedure: IRRIGATION AND DEBRIDEMENT ABSCESS;  Surgeon: Dustin Schools, [name];  Location: [facility];  Service: Orthopedics;  Laterality: Left;  . TOTAL HIP ARTHROPLASTY  2009   Dr. Gladstone [name]  . TOTAL HIP ARTHROPLASTY       reports that he quit smoking about  19 years ago. His smoking use included cigarettes. He has a 105.00 pack-year smoking history. He has never used smokeless tobacco. He reports that he does not drink alcohol and does not use drugs.  Allergies  Allergen Reactions  . Tetracycline Itching and Rash    Family History  Problem Relation Age of Onset  . Diabetes Mother   . Alzheimer's disease Mother   . Diabetes Father   . Lung cancer Father        Age 45  . Stroke Father   . Heart attack Father   . Lung cancer  Sister        lung  . Heart disease Maternal Grandfather   . COPD Sister   . Heart attack Sister   . Heart disease Sister      Prior to Admission medications   Medication Sig Start Date End Date Taking? Authorizing Provider  acetaminophen (QC ACETAMINOPHEN 8HR ARTH PAIN) 650 MG CR tablet Take 650 mg by mouth every 8 (eight) hours as needed for pain.   Yes [name]  albuterol (VENTOLIN HFA) 108 (90 Base) MCG/ACT inhaler Inhale 1-2 puffs into the lungs every 6 (six) hours as needed for wheezing or shortness of breath. 02/15/20  Yes [name], [name]., [name]  ampicillin (PRINCIPEN) 500 MG capsule Take 500 mg by mouth daily as needed (flare up).  10/24/19  Yes [name]  Ascorbic Acid (VITAMIN C) 1000 MG tablet Take 1,000 mg by mouth daily.   Yes [name]  aspirin EC 81 MG tablet Take 81 mg by mouth daily. Swallow whole.   Yes [name]  cholecalciferol (VITAMIN D) 1000 UNITS tablet Take 2,000 Units by mouth daily.    Yes [name]  donepezil (ARICEPT) 10 MG tablet TAKE 1 TABLET BY MOUTH AT BEDTIME Patient taking differently: Take 10 mg by mouth at bedtime.  08/24/19  Yes Copland, Dustin Hamman, [name]  fluticasone (FLONASE) 50 MCG/ACT nasal spray PLACE 2 SPRAYS IN EACH NOSTRIL DAILY Patient taking differently: Place 2 sprays into both nostrils daily. PLACE 2 SPRAYS IN EACH NOSTRIL DAILY 01/26/19  Yes Copland, Dustin Hamman, [name]  Hyprom-Naphaz-Polysorb-Zn Sulf (CLEAR EYES COMPLETE OP) Place 1 drop into both eyes daily as needed (dry eyes).   Yes [name]  insulin regular human CONCENTRATED (HUMULIN R U-500 KWIKPEN) 500 UNIT/ML kwikpen Inject 40 Units into the skin with breakfast, with lunch, and with evening meal.   Yes [name]  memantine (NAMENDA) 10 MG tablet TAKE 1 TABLET BY MOUTH TWICE A DAY Patient taking differently: Take 10 mg by mouth 2 (two) times daily.  09/20/19  Yes Copland, Dustin Hamman, [name]  montelukast  (SINGULAIR) 10 MG tablet Take 1 tablet (10 mg total) by mouth at bedtime. 09/20/19  Yes Bedsole, [name], [name]  pantoprazole (PROTONIX) 20 MG tablet Take 1 tablet (20 mg total) by mouth daily. Patient taking differently: Take 20 mg by mouth 2 (two) times daily.  01/21/20  Yes Heilingoetter, Cassandra L, [name]  prochlorperazine (COMPAZINE) 10 MG tablet Take 1 tablet (10 mg total) by mouth every 6 (six) hours as needed. Patient taking differently: Take 10 mg by mouth every 6 (six) hours as needed for nausea or vomiting.  10/23/19  Yes Heilingoetter, Cassandra L, [name]  simvastatin (ZOCOR) 40 MG tablet TAKE 1 TABLET BY MOUTH AT BEDTIME Patient taking differently: Take 40 mg by mouth at bedtime.  04/30/19  Yes Copland, Dustin Hamman, [name]  sucralfate (CARAFATE) 1 g tablet Take 1  tablet (1 g total) by mouth 4 (four) times daily. Dissolve each tablet in 15 cc water before use. 11/02/19  Yes Kyung Rudd, [name]  venlafaxine XR (EFFEXOR-XR) 75 MG 24 hr capsule TAKE 1 CAPSULE BY MOUTH EVERY MORNING AND 2 CAPSULES AT BEDTIME Patient taking differently: Take 75-150 mg by mouth See admin instructions. TAKE 1 CAPSULE BY MOUTH (75mg ) EVERY MORNING AND 2 CAPSULES (150mg ) AT BEDTIME 11/26/19  Yes Copland, Spencer, [name]  Insulin Pen Needle (PEN NEEDLES) 31G X 5 MM MISC 1 each by Does not apply route 3 (three) times daily. To inject insulin E11.42 09/20/19   Philemon Kingdom, [name]  metoprolol succinate (TOPROL-XL) 25 MG 24 hr tablet Take 0.5 tablets (12.5 mg total) by mouth daily. 03/11/20   Deno Etienne, DO    Physical Exam: Vitals:   03/11/20 1845 03/11/20 1900 03/11/20 2004 03/11/20 2154  BP: 134/78 134/78 (!) 151/91 (!) 177/60  Pulse: (!) 129  97 (!) 119  Resp: (!) 23 18 16  (!) 23  Temp:      TempSrc:      SpO2: 94%  97% 93%  Weight:      Height:        Constitutional: NAD, calm, comfortable Eyes: PERRL, lids and conjunctivae normal ENMT: Mucous membranes are moist. Posterior pharynx clear of any exudate or lesions.Normal  dentition.  Neck: normal, supple, no masses, no thyromegaly Respiratory: clear to auscultation bilaterally, no wheezing, no crackles. Normal respiratory effort. No accessory muscle use.  Cardiovascular: Irregularly irregular, tachycardic Abdomen: no tenderness, no masses palpated. No hepatosplenomegaly. Bowel sounds positive.  Musculoskeletal: no clubbing / cyanosis. No joint deformity upper and lower extremities. Good ROM, no contractures. Normal muscle tone.  Skin: no rashes, lesions, ulcers. No induration Neurologic: CN 2-12 grossly intact. Sensation intact, DTR normal. Strength 5/5 in all 4.  Psychiatric: Normal judgment and insight. Alert and oriented x 3. Normal mood.    Labs on Admission: I have personally reviewed following labs and imaging studies  CBC: Recent Labs  Lab 03/11/20 1004 03/11/20 1242  WBC 2.8* 3.3*  NEUTROABS 2.4  --   HGB 6.7* 7.2*  HCT 19.9* 21.8*  MCV 88.4 90.1  PLT 55* 65*   Basic Metabolic Panel: Recent Labs  Lab 03/11/20 1004 03/11/20 1242  NA 129* 125*  K 3.8 3.3*  CL 93* 91*  CO2 24 25  GLUCOSE 502* 522*  BUN 17 19  CREATININE 1.30* 1.40*  CALCIUM 8.3* 7.7*   GFR: Estimated Creatinine Clearance: 49.3 mL/min (A) (by C-G formula based on SCr of 1.4 mg/dL (H)). Liver Function Tests: Recent Labs  Lab 03/11/20 1004  AST <6*  ALT 7  ALKPHOS 100  BILITOT 0.7  PROT 5.8*  ALBUMIN 2.1*   No results for input(s): LIPASE, AMYLASE in the last 168 hours. No results for input(s): AMMONIA in the last 168 hours. Coagulation Profile: No results for input(s): INR, PROTIME in the last 168 hours. Cardiac Enzymes: No results for input(s): CKTOTAL, CKMB, CKMBINDEX, TROPONINI in the last 168 hours. BNP (last 3 results) No results for input(s): PROBNP in the last 8760 hours. HbA1C: No results for input(s): HGBA1C in the last 72 hours. CBG: Recent Labs  Lab 03/11/20 1216 03/11/20 1537 03/11/20 1809 03/11/20 2035  GLUCAP 521* 219* 69* 91    Lipid Profile: No results for input(s): CHOL, HDL, LDLCALC, TRIG, CHOLHDL, LDLDIRECT in the last 72 hours. Thyroid Function Tests: No results for input(s): TSH, T4TOTAL, FREET4, T3FREE, THYROIDAB in the last 72 hours.  Anemia Panel: No results for input(s): VITAMINB12, FOLATE, FERRITIN, TIBC, IRON, RETICCTPCT in the last 72 hours. Urine analysis:    Component Value Date/Time   COLORURINE YELLOW 03/11/2020 1556   APPEARANCEUR HAZY (A) 03/11/2020 1556   LABSPEC 1.012 03/11/2020 1556   PHURINE 5.0 03/11/2020 1556   GLUCOSEU >=500 (A) 03/11/2020 1556   HGBUR MODERATE (A) 03/11/2020 1556   BILIRUBINUR NEGATIVE 03/11/2020 1556   BILIRUBINUR 1+ 09/10/2014 1615   KETONESUR NEGATIVE 03/11/2020 1556   PROTEINUR 100 (A) 03/11/2020 1556   UROBILINOGEN 0.2 12/05/2014 2146   NITRITE NEGATIVE 03/11/2020 1556   LEUKOCYTESUR NEGATIVE 03/11/2020 1556    Radiological Exams on Admission: No results found.  EKG: Independently reviewed.  A.Flutter with rate of 119.  Assessment/Plan Principal Problem:   New onset atrial flutter (HCC) Active Problems:   Chronic kidney disease, stage III (moderate) (HCC)   Uncontrolled type 2 diabetes mellitus with peripheral neuropathy (HCC)   Small cell lung cancer ([place])   Antineoplastic chemotherapy induced pancytopenia ([place])    1. New onset A.Flutter with initial 2:1 conduction (RVR) - 1. A.Fib pathway 2. Cardizem gtt for rate control 3. Tele monitor 4. CHADS vasc = 3 but anticoagulation contraindicated at the moment due to thrombocytopenia, re-address when platelets improve 5. Check Mg 6. Check TSH 7. 2d echo 2. Hypokalemia - 1. 73meq PO K 2. Probably even lower than the initial 3.3 now after his insulin has taken effect 3. Repeat BMP ordered and pending 3. Anemia due to chemotherapy - 1. S/p 1u PRBC transfusion 2. Repeat CBC in AM 4. Uncontrolled DM2 -  1. Takes 40u of U500 TID at home 2. Will put on Resistant scale SSI AC/HS 3. 10u lantus  daily 4. And 10u mealtime coverage for the moment 5. SCLC - 1. On chemotherapy with Dr. Earlie Server 2. Looks like he had radiation therapy over the summer  DVT prophylaxis: SCDs [name] Status: Full Family Communication: No family in room Disposition Plan: home after A.Flutter rate controlled Consults called: None Admission status: Place in obs       Eldine Rencher, [place] Hospitalists  How to contact the [facility] Attending or Consulting provider [place] or covering provider during after hours [place], for this patient?  1. Check the care team in [facility] and look for a) attending/consulting [facility] provider listed and b) the [facility] team listed 2. Log into [email or web address]  Amion Physician Scheduling and messaging for groups and whole hospitals  On call and physician scheduling software for group practices, residents, hospitalists and other medical providers for call, clinic, rotation and shift schedules. OnCall [employer] is a hospital-wide system for scheduling doctors and paging doctors on call. EasyPlot is for scientific plotting and data analysis.  [email or web address]  and use [facility] universal password to access. If you do not have the password, please contact the hospital operator.  3. Locate the [facility] provider you are looking for under [facility] and page to a number that you can be directly reached. 4. If you still have difficulty reaching the provider, please page the [facility] (Director on Call) for the Hospitalists listed on amion for assistance.  03/11/2020, 9:55 PM

## 2020-03-11 NOTE — Progress Notes (Signed)
CRITICAL VALUE ALERT  Critical Value:  HGB 6.7 Date & Time Noted:  03/11/20 @ 1035 Provider Notified: [name] Orders Received/Actions taken: schedule message sent to schedule blood transfusion 02/09/20@1035 

## 2020-03-11 NOTE — ED Notes (Signed)
Patient refused second IV. Will use port for the cardizem Drip.

## 2020-03-11 NOTE — ED Notes (Signed)
Pt request CBG to be checked. Result 69. Orange juice, sandwich, peanut butter and crackers provided.

## 2020-03-11 NOTE — Telephone Encounter (Signed)
CRITICAL VALUE STICKER  CRITICAL VALUE: Glucose = 502  RECEIVER (on-site recipient of call): Yetta Glassman, Mildred NOTIFIED: 03/11/20 at 10:55am  MESSENGER (representative from lab): Pam  MD NOTIFIED: Rubin Payor, [place]: 10:57am  RESPONSE: [name], [name] discussed this with Dr. [name] who advised for pt to go to the ED. Diane, RN called pt and advised of this. Pt expressed understanding of this information.

## 2020-03-11 NOTE — Discharge Instructions (Addendum)
Given your low blood counts and the atrial flutter, we strongly recommended admission.  Please return anytime you would like to be reevaluated.  You were diagnosed today with atrial flutter.  Something that significantly increases your risk of having a stroke.  Unfortunately with your blood level being low I do not want to start you on a medicine that would thin your blood to help prevent a stroke.  Please follow-up with atrial fibrillation clinic in the office.

## 2020-03-11 NOTE — ED Provider Notes (Signed)
Signout note  74 year old male undergoing chemotherapy for lung cancer presents to ER with concern for low hemoglobin.  Outpatient globin showing 6.7.  Patient has been ordered to be transfused 1 unit.  Found to be in atrial flutter heart rate 150s.  Patient agreeable to blood transfusion but refusing admission, given dose of IV metoprolol.  Plan to discharge with plan for outpatient A. fib clinic, metoprolol p.o.  5:00 pm Received signout from [facility] patient, heart rate still in 150s despite multiple doses of metoprolol.  Recommended admission for further management, patient initially hesitant but later agreed, consulted hospitalist for admission, Dr. Alcario Drought accepting.    Lucrezia Starch, MD 03/11/20 2326

## 2020-03-11 NOTE — ED Triage Notes (Addendum)
Patient states his glucometer read "High" and he took 50 units Humulin R 1 hour ago. Patient denies any abdominal pain, N/V.  CBG in triage 521.  Patient reports that he was called today and was told he needed blood. Hgb-6.7

## 2020-03-11 NOTE — ED Notes (Signed)
Patient used his call bell to ring the nurses desk. He asked for a pair of scissors to cut his underwear off. After checking with the charge nurse I found a pair of scissors and went into the patients room. He was on the bedside commode but was unable to make it time and had soiled his underwear. I again asked if he was sure he wanted me to cut them off. He said yes so I cut and removed them. Patient was able to clean himself up.

## 2020-03-11 NOTE — ED Notes (Signed)
Blood bank has blood ready for this patient, informed Stephanie,RN.

## 2020-03-11 NOTE — ED Notes (Signed)
CBG collected by day shift at [facility]

## 2020-03-11 NOTE — ED Notes (Signed)
Pt states that he does not want a second IV, and that he understands that generally when a patient is on a cardizem drip they need to have two IVs.

## 2020-03-11 NOTE — ED Notes (Signed)
RBC's infusing without difficulty. No evidence of reaction.

## 2020-03-12 ENCOUNTER — Observation Stay (HOSPITAL_COMMUNITY): Payer: Medicare Other

## 2020-03-12 ENCOUNTER — Encounter: Payer: Self-pay | Admitting: Physician Assistant

## 2020-03-12 ENCOUNTER — Other Ambulatory Visit: Payer: Self-pay | Admitting: Physician Assistant

## 2020-03-12 DIAGNOSIS — I4892 Unspecified atrial flutter: Secondary | ICD-10-CM | POA: Diagnosis present

## 2020-03-12 DIAGNOSIS — E876 Hypokalemia: Secondary | ICD-10-CM | POA: Diagnosis present

## 2020-03-12 DIAGNOSIS — I129 Hypertensive chronic kidney disease with stage 1 through stage 4 chronic kidney disease, or unspecified chronic kidney disease: Secondary | ICD-10-CM | POA: Diagnosis present

## 2020-03-12 DIAGNOSIS — F32A Depression, unspecified: Secondary | ICD-10-CM | POA: Diagnosis present

## 2020-03-12 DIAGNOSIS — E1142 Type 2 diabetes mellitus with diabetic polyneuropathy: Secondary | ICD-10-CM | POA: Diagnosis present

## 2020-03-12 DIAGNOSIS — E785 Hyperlipidemia, unspecified: Secondary | ICD-10-CM | POA: Diagnosis present

## 2020-03-12 DIAGNOSIS — D649 Anemia, unspecified: Secondary | ICD-10-CM | POA: Diagnosis not present

## 2020-03-12 DIAGNOSIS — N183 Chronic kidney disease, stage 3 unspecified: Secondary | ICD-10-CM

## 2020-03-12 DIAGNOSIS — Z9049 Acquired absence of other specified parts of digestive tract: Secondary | ICD-10-CM | POA: Diagnosis not present

## 2020-03-12 DIAGNOSIS — Z794 Long term (current) use of insulin: Secondary | ICD-10-CM | POA: Diagnosis not present

## 2020-03-12 DIAGNOSIS — Z79899 Other long term (current) drug therapy: Secondary | ICD-10-CM | POA: Diagnosis not present

## 2020-03-12 DIAGNOSIS — C3491 Malignant neoplasm of unspecified part of right bronchus or lung: Secondary | ICD-10-CM | POA: Diagnosis present

## 2020-03-12 DIAGNOSIS — E1165 Type 2 diabetes mellitus with hyperglycemia: Secondary | ICD-10-CM | POA: Diagnosis present

## 2020-03-12 DIAGNOSIS — Z20822 Contact with and (suspected) exposure to covid-19: Secondary | ICD-10-CM | POA: Diagnosis present

## 2020-03-12 DIAGNOSIS — T451X5A Adverse effect of antineoplastic and immunosuppressive drugs, initial encounter: Secondary | ICD-10-CM | POA: Diagnosis present

## 2020-03-12 DIAGNOSIS — Z923 Personal history of irradiation: Secondary | ICD-10-CM | POA: Diagnosis not present

## 2020-03-12 DIAGNOSIS — Z801 Family history of malignant neoplasm of trachea, bronchus and lung: Secondary | ICD-10-CM | POA: Diagnosis not present

## 2020-03-12 DIAGNOSIS — C3490 Malignant neoplasm of unspecified part of unspecified bronchus or lung: Secondary | ICD-10-CM | POA: Diagnosis not present

## 2020-03-12 DIAGNOSIS — N1831 Chronic kidney disease, stage 3a: Secondary | ICD-10-CM | POA: Diagnosis present

## 2020-03-12 DIAGNOSIS — I4891 Unspecified atrial fibrillation: Secondary | ICD-10-CM | POA: Diagnosis present

## 2020-03-12 DIAGNOSIS — I2510 Atherosclerotic heart disease of native coronary artery without angina pectoris: Secondary | ICD-10-CM | POA: Diagnosis present

## 2020-03-12 DIAGNOSIS — K219 Gastro-esophageal reflux disease without esophagitis: Secondary | ICD-10-CM | POA: Diagnosis present

## 2020-03-12 DIAGNOSIS — M109 Gout, unspecified: Secondary | ICD-10-CM | POA: Diagnosis present

## 2020-03-12 DIAGNOSIS — D61810 Antineoplastic chemotherapy induced pancytopenia: Secondary | ICD-10-CM | POA: Diagnosis present

## 2020-03-12 DIAGNOSIS — E1122 Type 2 diabetes mellitus with diabetic chronic kidney disease: Secondary | ICD-10-CM | POA: Diagnosis present

## 2020-03-12 DIAGNOSIS — I10 Essential (primary) hypertension: Secondary | ICD-10-CM | POA: Diagnosis not present

## 2020-03-12 DIAGNOSIS — Z7982 Long term (current) use of aspirin: Secondary | ICD-10-CM | POA: Diagnosis not present

## 2020-03-12 DIAGNOSIS — Z87891 Personal history of nicotine dependence: Secondary | ICD-10-CM | POA: Diagnosis not present

## 2020-03-12 LAB — CBC
HCT: 20.8 % — ABNORMAL LOW (ref 39.0–52.0)
Hemoglobin: 7.2 g/dL — ABNORMAL LOW (ref 13.0–17.0)
MCH: 30.8 pg (ref 26.0–34.0)
MCHC: 34.6 g/dL (ref 30.0–36.0)
MCV: 88.9 fL (ref 80.0–100.0)
Platelets: 49 10*3/uL — ABNORMAL LOW (ref 150–400)
RBC: 2.34 MIL/uL — ABNORMAL LOW (ref 4.22–5.81)
RDW: 14.5 % (ref 11.5–15.5)
WBC: 1.9 10*3/uL — ABNORMAL LOW (ref 4.0–10.5)
nRBC: 0 % (ref 0.0–0.2)

## 2020-03-12 LAB — BASIC METABOLIC PANEL
Anion gap: 9 (ref 5–15)
BUN: 16 mg/dL (ref 8–23)
CO2: 26 mmol/L (ref 22–32)
Calcium: 7.6 mg/dL — ABNORMAL LOW (ref 8.9–10.3)
Chloride: 94 mmol/L — ABNORMAL LOW (ref 98–111)
Creatinine, Ser: 1.21 mg/dL (ref 0.61–1.24)
GFR, Estimated: >60 mL/min (ref >60)
Glucose, Bld: 272 mg/dL — ABNORMAL HIGH (ref 70–99)
Potassium: 4.1 mmol/L (ref 3.5–5.1)
Sodium: 129 mmol/L — ABNORMAL LOW (ref 135–145)

## 2020-03-12 LAB — HEMOGLOBIN AND HEMATOCRIT, BLOOD
HCT: 22.6 % — ABNORMAL LOW (ref 39.0–52.0)
Hemoglobin: 7.9 g/dL — ABNORMAL LOW (ref 13.0–17.0)

## 2020-03-12 LAB — ECHOCARDIOGRAM COMPLETE
Area-P 1/2: 4.8 cm2
Height: 71 in
S' Lateral: 2.6 cm
Weight: 2912 oz

## 2020-03-12 LAB — HEMOGLOBIN A1C
Hgb A1c MFr Bld: 8.7 % — ABNORMAL HIGH (ref 4.8–5.6)
Mean Plasma Glucose: 202.99 mg/dL

## 2020-03-12 LAB — GLUCOSE, CAPILLARY
Glucose-Capillary: 284 mg/dL — ABNORMAL HIGH (ref 70–99)
Glucose-Capillary: 169 mg/dL — ABNORMAL HIGH (ref 70–99)
Glucose-Capillary: 96 mg/dL (ref 70–99)
Glucose-Capillary: 193 mg/dL — ABNORMAL HIGH (ref 70–99)

## 2020-03-12 LAB — PREPARE RBC (CROSSMATCH)

## 2020-03-12 LAB — T4, FREE: Free T4: 1.35 ng/dL — ABNORMAL HIGH (ref 0.61–1.12)

## 2020-03-12 MED ORDER — PERFLUTREN LIPID MICROSPHERE
1.0000 mL | INTRAVENOUS | Status: AC | PRN
  Administered 2020-03-12: 2 mL via INTRAVENOUS
  Filled 2020-03-12: qty 10

## 2020-03-12 MED ORDER — CHLORHEXIDINE GLUCONATE CLOTH 2 % EX PADS
6.0000 | MEDICATED_PAD | Freq: Every day | CUTANEOUS | Status: DC
  Administered 2020-03-12 – 2020-03-15 (×4): 6 via TOPICAL

## 2020-03-12 MED ORDER — SODIUM CHLORIDE 0.9% IV SOLUTION: Freq: Once | INTRAVENOUS | Status: AC

## 2020-03-12 MED ORDER — MAGNESIUM SULFATE 2 GM/50ML IV SOLN
2.0000 g | Freq: Once | INTRAVENOUS | Status: AC
  Administered 2020-03-12: 2 g via INTRAVENOUS
  Filled 2020-03-12: qty 50

## 2020-03-12 MED ORDER — METOPROLOL TARTRATE 25 MG PO TABS
25.0000 mg | ORAL_TABLET | Freq: Two times a day (BID) | ORAL | Status: DC
  Administered 2020-03-12 – 2020-03-13 (×3): 25 mg via ORAL
  Filled 2020-03-12 (×3): qty 1

## 2020-03-12 MED ORDER — SODIUM CHLORIDE 0.9% FLUSH: 10.0000 mL | INTRAVENOUS | Status: DC | PRN

## 2020-03-12 MED ORDER — POTASSIUM CHLORIDE CRYS ER 20 MEQ PO TBCR
40.0000 meq | EXTENDED_RELEASE_TABLET | Freq: Two times a day (BID) | ORAL | Status: AC
  Administered 2020-03-12 (×2): 40 meq via ORAL
  Filled 2020-03-12 (×2): qty 2

## 2020-03-12 MED ORDER — GLUCERNA SHAKE PO LIQD
237.0000 mL | Freq: Two times a day (BID) | ORAL | Status: DC
  Administered 2020-03-12 – 2020-03-13 (×3): 237 mL via ORAL
  Filled 2020-03-12 (×7): qty 237

## 2020-03-12 MED ORDER — PROSOURCE PLUS PO LIQD
30.0000 mL | Freq: Every day | ORAL | Status: DC
  Administered 2020-03-12 – 2020-03-13 (×2): 30 mL via ORAL
  Filled 2020-03-12 (×2): qty 30

## 2020-03-12 NOTE — Progress Notes (Signed)
Received a call from cardiology regarding recommendations/guidelines regarding when it is safe to initiate anti-coagulation for this patient. Recommend holding anti-coagulation until platelet count >50k.

## 2020-03-12 NOTE — Consult Note (Signed)
Cardiology Consultation:   Patient ID: [name] [name]: [id number]; DOB: 1945-04-17  Admit date: 03/11/2020 Date of Consult: 03/12/2020  Primary Care Provider: [name], MD [facility] Cardiologist: New to [place]; Dr. L[facility] Electrophysiologist:  None    Patient Profile:   [name] is a 74 y.o. male with a PMH of non-obstructive CAD, HTN, brittle DM type 2, CKD stage 3, and SCLC s/p XRT and currently on chemotherapy c/b pancytopenia requiring blood transfusions, who is being seen today for the evaluation of new onset atrial flutter at the request of Dr. Bonner Puna.  History of Present Illness:   [name] is currently undergoing chemotherapy for management of his SCLC. He had outpatient labs showing Hgb 6.7 and was prompted to present to the ED for a blood transfusion. On arrival to the ED he was found to be in new onset atrial flutter with HR in the 150s. He noticed some SOB and lightheadedness on arrival to the ED. He was given 1uPRBC and started on a diltiazem gtt after multiple rounds of metoprolol failed to control HR. He was admitted to medicine and cardiology asked to evaluate for new onset atrial flutter.   He was remotely admitted to cardiology in 2013 for chest pain where he underwent a cardiac catheterization which showed mild non-obstructive CAD (20% RCA stenosis). He had an echocardiogram in 2016 which showed EF 60-65%, normal LV diastolic function, no RWMA, normal LA size, and no significant valvular abnormalities.   At the time of this evaluation he is feeling like his usual self. He reports chronic fatigue/weakness with ongoing chemotherapy, though reports this is his last session for the time being. He is quite inactive at home with the most strenuous activity being wheeling himself to the bathroom in his wheelchair. He denies any chest pain/pressure, SOB, DOE, palpitations, syncope, LE edema, orthopnea, or PND. He has had trouble with  orthostatic hypotension more recently and is no longer on blood pressure medications as a result. He has no complaints of hematuria, hematochezia, or melena.   Past Medical History:  Diagnosis Date  . Allergic rhinitis due to pollen   . Chronic kidney disease, stage III (moderate) ([place]) 12/19/2012  . Depression   . Diabetes mellitus ([place])   . Diverticulosis   . Erectile dysfunction associated with type 2 diabetes mellitus ([place]) 05/28/2015  . Former very heavy cigarette smoker (more than 40 per day) 10/07/2014  . GERD (gastroesophageal reflux disease)   . Gout   . Hyperlipidemia   . Hypertension   . Hypogonadism male 06/14/2012  . Memory loss   . Osteoarthritis   . Personal history of colonic polyps    1996  . Small cell carcinoma of right lung ([place]) 10/23/2019    Past Surgical History:  Procedure Laterality Date  . BRONCHIAL NEEDLE ASPIRATION BIOPSY  10/17/2019   Procedure: BRONCHIAL NEEDLE ASPIRATION BIOPSIES;  Surgeon: Juanito Doom, MD;  Location: [facility];  Service: Cardiopulmonary;;  . CARDIAC CATHETERIZATION  11/2011   [facility]  . CHOLECYSTECTOMY  1980  . ENDOBRONCHIAL ULTRASOUND Bilateral 10/17/2019   Procedure: ENDOBRONCHIAL ULTRASOUND;  Surgeon: Juanito Doom, MD;  Location: [facility];  Service: Cardiopulmonary;  Laterality: Bilateral;  . ESOPHAGOGASTRODUODENOSCOPY (EGD) WITH PROPOFOL N/A 09/27/2017   Procedure: ESOPHAGOGASTRODUODENOSCOPY (EGD) WITH PROPOFOL;  Surgeon: Lin Landsman, MD;  Location: [facility];  Service: Gastroenterology;  Laterality: N/A;  . EYE SURGERY Right 07/29/2016   cataract - Dr. Manuella Ghazi  . EYE SURGERY Left 08/23/2106  cataract - Dr. Manuella Ghazi  . HEMOSTASIS CONTROL  10/17/2019   Procedure: HEMOSTASIS CONTROL;  Surgeon: Juanito Doom, MD;  Location: [facility];  Service: Cardiopulmonary;;  . IR IMAGING GUIDED PORT INSERTION  11/30/2019  . IRRIGATION AND DEBRIDEMENT ABSCESS Left 11/23/2019   Procedure: IRRIGATION AND DEBRIDEMENT ABSCESS;   Surgeon: Melina Schools, MD;  Location: [facility];  Service: Orthopedics;  Laterality: Left;  . TOTAL HIP ARTHROPLASTY  2009   Dr. Gladstone Lighter  . TOTAL HIP ARTHROPLASTY       Home Medications:  Prior to Admission medications   Medication Sig Start Date End Date Taking? Authorizing Provider  acetaminophen (QC ACETAMINOPHEN 8HR ARTH PAIN) 650 MG CR tablet Take 650 mg by mouth every 8 (eight) hours as needed for pain.   Yes [provider]  albuterol (VENTOLIN HFA) 108 (90 Base) MCG/ACT inhaler Inhale 1-2 puffs into the lungs every 6 (six) hours as needed for wheezing or shortness of breath. 02/15/20  Yes Tanner, Lyndon Code., [name]  ampicillin (PRINCIPEN) 500 MG capsule Take 500 mg by mouth daily as needed (flare up).  10/24/19  Yes [provider]  Ascorbic Acid (VITAMIN C) 1000 MG tablet Take 1,000 mg by mouth daily.   Yes [provider]  aspirin EC 81 MG tablet Take 81 mg by mouth daily. Swallow whole.   Yes [provider]  cholecalciferol (VITAMIN D) 1000 UNITS tablet Take 2,000 Units by mouth daily.    Yes [provider]  donepezil (ARICEPT) 10 MG tablet TAKE 1 TABLET BY MOUTH AT BEDTIME Patient taking differently: Take 10 mg by mouth at bedtime.  08/24/19  Yes Copland, Frederico Hamman, MD  fluticasone (FLONASE) 50 MCG/ACT nasal spray PLACE 2 SPRAYS IN EACH NOSTRIL DAILY Patient taking differently: Place 2 sprays into both nostrils daily. PLACE 2 SPRAYS IN EACH NOSTRIL DAILY 01/26/19  Yes Copland, Frederico Hamman, MD  Hyprom-Naphaz-Polysorb-Zn Sulf (CLEAR EYES COMPLETE OP) Place 1 drop into both eyes daily as needed (dry eyes).   Yes [provider]  insulin regular human CONCENTRATED (HUMULIN R U-500 KWIKPEN) 500 UNIT/ML kwikpen Inject 40 Units into the skin with breakfast, with lunch, and with evening meal.   Yes [provider]  memantine (NAMENDA) 10 MG tablet TAKE 1 TABLET BY MOUTH TWICE A DAY Patient taking differently: Take 10 mg by mouth 2 (two)  times daily.  09/20/19  Yes Copland, Frederico Hamman, MD  montelukast (SINGULAIR) 10 MG tablet Take 1 tablet (10 mg total) by mouth at bedtime. 09/20/19  Yes Bedsole, Amy E, MD  pantoprazole (PROTONIX) 20 MG tablet Take 1 tablet (20 mg total) by mouth daily. Patient taking differently: Take 20 mg by mouth 2 (two) times daily.  01/21/20  Yes Heilingoetter, Cassandra L, [name]  prochlorperazine (COMPAZINE) 10 MG tablet Take 1 tablet (10 mg total) by mouth every 6 (six) hours as needed. Patient taking differently: Take 10 mg by mouth every 6 (six) hours as needed for nausea or vomiting.  10/23/19  Yes Heilingoetter, Cassandra L, [name]  simvastatin (ZOCOR) 40 MG tablet TAKE 1 TABLET BY MOUTH AT BEDTIME Patient taking differently: Take 40 mg by mouth at bedtime.  04/30/19  Yes Copland, Frederico Hamman, MD  sucralfate (CARAFATE) 1 g tablet Take 1 tablet (1 g total) by mouth 4 (four) times daily. Dissolve each tablet in 15 cc water before use. 11/02/19  Yes Kyung Rudd, MD  venlafaxine XR (EFFEXOR-XR) 75 MG 24 hr capsule TAKE 1 CAPSULE BY MOUTH EVERY MORNING AND 2 CAPSULES AT BEDTIME  Patient taking differently: Take 75-150 mg by mouth See admin instructions. TAKE 1 CAPSULE BY MOUTH (75mg ) EVERY MORNING AND 2 CAPSULES (150mg ) AT BEDTIME 11/26/19  Yes Copland, Spencer, MD  Insulin Pen Needle (PEN NEEDLES) 31G X 5 MM MISC 1 each by Does not apply route 3 (three) times daily. To inject insulin E11.42 09/20/19   Philemon Kingdom, MD  metoprolol succinate (TOPROL-XL) 25 MG 24 hr tablet Take 0.5 tablets (12.5 mg total) by mouth daily. 03/11/20   Deno Etienne, DO    Inpatient Medications: Scheduled Meds: . sodium chloride   Intravenous Once  . sodium chloride   Intravenous Once  . Chlorhexidine Gluconate Cloth  6 each Topical Daily  . donepezil  10 mg Oral QHS  . fluticasone  2 spray Each Nare Daily  . insulin aspart  0-20 Units Subcutaneous TID WC  . insulin aspart  0-5 Units Subcutaneous QHS  . insulin aspart  10 Units Subcutaneous  TID WC  . insulin glargine  10 Units Subcutaneous Daily  . memantine  10 mg Oral BID  . metoprolol tartrate  25 mg Oral BID  . montelukast  10 mg Oral QHS  . pantoprazole  20 mg Oral BID  . potassium chloride  40 mEq Oral BID  . simvastatin  40 mg Oral QHS  . sucralfate  1 g Oral TID AC & HS  . venlafaxine XR  150 mg Oral QHS  . venlafaxine XR  75 mg Oral Q breakfast   Continuous Infusions: . diltiazem (CARDIZEM) infusion 10 mg/hr (03/12/20 0822)   PRN Meds: acetaminophen, albuterol, ondansetron (ZOFRAN) IV, prochlorperazine, sodium chloride flush  Allergies:    Allergies  Allergen Reactions  . Tetracycline Itching and Rash    Social History:   Social History   Socioeconomic History  . Marital status: Widowed    Spouse name: Not on file  . Number of children: 4  . Years of education: 12th  . Highest education level: Not on file  Occupational History  . Occupation: Counsellor  . Occupation: retired Magazine features editor: [employer]  Tobacco Use  . Smoking status: Former Smoker    Packs/day: 3.00    Years: 35.00    Pack years: 105.00    Types: Cigarettes    Quit date: 04/12/2000    Years since quitting: 19.9  . Smokeless tobacco: Never Used  Vaping Use  . Vaping Use: Never used  Substance and Sexual Activity  . Alcohol use: No  . Drug use: No  . Sexual activity: Never  Other Topics Concern  . Not on file  Social History Narrative   No regular exercise   Caffeine use: yes, dt coke   Lives at home with his mother lives with him.   Wife Fraser Din recently deceased   Right-handed.   Social Determinants of Health   Financial Resource Strain: Low Risk   . Difficulty of Paying Living Expenses: Not hard at all  Food Insecurity: No Food Insecurity  . Worried About Charity fundraiser in the Last Year: Never true  . Ran Out of Food in the Last Year: Never true  Transportation Needs: No Transportation Needs  . Lack of Transportation (Medical): No  . Lack of  Transportation (Non-Medical): No  Physical Activity: Inactive  . Days of Exercise per Week: 0 days  . Minutes of Exercise per Session: 0 min  Stress: No Stress Concern Present  . Feeling of Stress : Not at all  Social Connections:   . Frequency of Communication with Friends and Family: Not on file  . Frequency of Social Gatherings with Friends and Family: Not on file  . Attends Religious Services: Not on file  . Active Member of Clubs or Organizations: Not on file  . Attends Archivist Meetings: Not on file  . Marital Status: Not on file  Intimate Partner Violence: Not At Risk  . Fear of Current or Ex-Partner: No  . Emotionally Abused: No  . Physically Abused: No  . Sexually Abused: No    Family History:    Family History  Problem Relation Age of Onset  . Diabetes Mother   . Alzheimer's disease Mother   . Diabetes Father   . Lung cancer Father        Age 66  . Stroke Father   . Heart attack Father   . Lung cancer Sister        lung  . Heart disease Maternal Grandfather   . COPD Sister   . Heart attack Sister   . Heart disease Sister      ROS:  Please see the history of present illness.   All other ROS reviewed and negative.     Physical Exam/Data:   Vitals:   03/11/20 2300 03/12/20 0003 03/12/20 0433 03/12/20 0758  BP: (!) 142/62 (!) 136/113 122/69 127/71  Pulse: (!) 115 (!) 57 (!) 104 98  Resp: 19 20 18 19   Temp:  99.1 F (37.3 C) 99.8 F (37.7 C) 98.9 F (37.2 C)  TempSrc:      SpO2: 94% 95% 95% 96%  Weight:      Height:        Intake/Output Summary (Last 24 hours) at 03/12/2020 1156 Last data filed at 03/12/2020 1046 Gross per 24 hour  Intake 508.21 ml  Output --  Net 508.21 ml   Last 3 Weights 03/11/2020 03/03/2020 03/03/2020  Weight (lbs) 182 lb 185 lb 11.2 oz 186 lb 8 oz  Weight (kg) 82.555 kg 84.233 kg 84.596 kg     Body mass index is 25.38 kg/m.  General:  Chronically ill appearing gentleman laying in bed in NAD HEENT: sclera  anicteric Neck: no JVD Vascular: No carotid bruits; distal pulses 2+ bilaterally Cardiac:  normal S1, S2; IRIR; no murmurs, rubs, or gallops Lungs:  clear to auscultation bilaterally, no wheezing, rhonchi or rales  Abd: soft, nontender, no hepatomegaly  Ext: no edema Musculoskeletal:  No deformities, BUE and BLE strength normal and equal Skin: warm and dry  Neuro:  CNs 2-12 intact, no focal abnormalities noted Psych:  Normal affect   EKG:  The EKG was personally reviewed and demonstrates:  Atrial flutter with 2:1 AV block, rate 149bpm, non-specific ST-T wave abnormalities Telemetry:  Telemetry was personally reviewed and demonstrates:  Atrial flutter with variable AV block with rates improved to 100s-110s.  Relevant CV Studies: Echocardiogram 2016: - Left ventricle: The cavity size was normal. Systolic function was  normal. The estimated ejection fraction was in the range of 60%  to 65%. Wall motion was normal; there were no regional wall  motion abnormalities. Left ventricular diastolic function  parameters were normal.  - Left atrium: The atrium was normal in size.  - Right ventricle: Systolic function was grossly normal.  - Pulmonary arteries: Systolic pressure was within the normal  range.   Impressions:   - Challenging image quality secondary to body habitus. Grossly a  normal study.   Laboratory Data:  High Sensitivity Troponin:  No results for input(s): TROPONINIHS in the last 720 hours.   Chemistry Recent Labs  Lab 03/11/20 1242 03/11/20 2158 03/12/20 0608  NA 125* 131* 129*  K 3.3* 3.6 4.1  CL 91* 94* 94*  CO2 25 26 26   GLUCOSE 522* 139* 272*  BUN 19 16 16   CREATININE 1.40* 1.04 1.21  CALCIUM 7.7* 7.6* 7.6*  GFRNONAA 53* >60 >60  ANIONGAP 9 11 9     Recent Labs  Lab 03/11/20 1004  PROT 5.8*  ALBUMIN 2.1*  AST <6*  ALT 7  ALKPHOS 100  BILITOT 0.7   Hematology Recent Labs  Lab 03/11/20 1004 03/11/20 1242 03/12/20 0608  WBC 2.8*  3.3* 1.9*  RBC 2.25* 2.42* 2.34*  HGB 6.7* 7.2* 7.2*  HCT 19.9* 21.8* 20.8*  MCV 88.4 90.1 88.9  MCH 29.8 29.8 30.8  MCHC 33.7 33.0 34.6  RDW 14.5 14.5 14.5  PLT 55* 65* 49*   BNPNo results for input(s): BNP, PROBNP in the last 168 hours.  DDimer No results for input(s): DDIMER in the last 168 hours.   Radiology/Studies:  No results found.   Assessment and Plan:   1. New onset atrial flutter: patient presented for blood transfusion after outpatient labs showed Hgb 6.7 and was incidentally found to be in atrial flutter with RVR with rates up to 150s on EKG. He was given several doses of metoprolol without improvement in HR and was subsequently started on a diltiazem gtt with HR's to the 90s-110s though he remains in atrial flutter with variable AV block. Unfortunately his pancytopenia from ongoing chemotherapy will limit management options as he has required blood transfusions this admission and PLT count is down to 49 this morning. TSH wnl though FT4 was mildly elevated.  - Continue diltiazem gtt for now - Will start metoprolol tartrate 25mg  BID for improved rate control - CHA2DS2-VASc Score = 4 [CHF History: 0, HTN History: 1, Diabetes History: 1, Stroke History: 0, Vascular Disease History: 1, Age Score: 1, Gender Score: 0].  Therefore, the patient's annual risk of stroke is 4.8 %. - Ultimately he would benefit from anticoagulation though, with ongoing pancytopenia and need for blood transfusions, will have to hold for now.  - Unable to pursue TEE/DCCV given limitations of anticoagulation at this time.   2. Symptomatic anemia: patient presented for blood transfusion given Hgb 6.7 outpatient. Now s/p 1 uPRBC with 2nd unit ordered today after AM Hgb was 7.2. Anemia occurred in the setting of ongoing chemotherapy. No complaints of bleeding - Continue management per primary team  3. Non-obstructive CAD: diagnosed on LHC in 2013 after presenting with chest pain and being found to have 20%  RCA stenosis. No recent anginal complaints. EKG without overt ischemic changes. Would continue to hold aspirin given ongoing anemia - Continue statin  4. HTN: BP initially soft in the setting of tachycardia with HR in the 150s, now intermittently elevated. He does have a history of orthostatic hypotension and as a result is no longer on blood pressure medications outpatient - Continue management in the context of #1  5. HLD: LDL 82 12/2018; goal <70 given CAD history - Continue statin  6. Brittle DM type 2: blood glucose levels have been quite labile with ongoing chemotherapy. BG up to 500s on admission and as low as 69. A1C 8.7; goal <7 - Continue management per primary team       CHA2DS2-VASc Score = 4  This indicates a 4.8% annual risk of  stroke. The patient's score is based upon: CHF History: 0 HTN History: 1 Diabetes History: 1 Stroke History: 0 Vascular Disease History: 1 Age Score: 1 Gender Score: 0         For questions or updates, please contact [facility] Please consult [email or web address] for contact info under    Signed, Abigail Butts, [name]  03/12/2020 11:56 AM

## 2020-03-12 NOTE — Progress Notes (Signed)
Initial Nutrition Assessment  RD working remotely.  DOCUMENTATION CODES:   Not applicable  INTERVENTION:  - will order Glucerna Shake BID, each supplement provides 220 kcal and 10 grams of protein. - will order 30 ml Prosource Plus one/day, each supplement provides 100 kcal and 15 grams protein.  - complete NFPE at follow-up.  NUTRITION DIAGNOSIS:   Increased nutrient needs related to acute illness, chronic illness, cancer and cancer related treatments as evidenced by estimated needs.  GOAL:   Patient will meet greater than or equal to 90% of their needs  MONITOR:   PO intake, Supplement acceptance, Labs, Weight trends  REASON FOR ASSESSMENT:   Malnutrition Screening Tool  ASSESSMENT:   74 y.o. male with medical history of SCLC on chemotherapy, type 2 DM, HTN, stage 3 CKD, and anemia requiring transfusions. His Oncologist recommended he go to the ED d/t labs indicating low hemoglobin (6.7 g/dl) and hyperglycemia (522 mg/dl) on 11/30. Cardiology consulted.  He consumed 100% of breakfast this AM (318 kcal, 5 grams protein). Lunch has been ordered but has not yet arrived (468 kcal, 16 grams protein).   Patient has not been seen by a [place] RD since 04/07/19.   MST report note indicates patient reported losing 20 lb in the past 4-5 months. Weight yesterday was 182 lb, weight on 11/22 was 185 lb, and the furthest back weight currently available was on 8/23 when he weighed 188 lb.   These weights indicate that he has lost 3 lb (1.6% body weight) in the past 9 days which is not significant for time frame and 6 lb (3.2% body weight) in the past 3 months; which is also not significant for time frame.   Per notes: - new onset aflutter with RVR - chemo-induced pancytopenia - recurrent symptomatic anemia without bleeding - brittle type 2 DM - SCLC s/p XRT on chemo    Labs reviewed; CBGs: 284 and 169 mg/dl, Na: 129 mmol/l, Cl: 94 mmol/l, Ca: 7.6 mg/dl, Mg: 1.2 mg/dl.   Medications reviewed; sliding scale novolog, 10 units novolog TID, 10 units lantus/day, 2 g IV Mg sulfate x1 run 12/1, 20 mg oral protonix BID, 40 mEq Klor-Con x2 doses 12/1, 1 g carafate TID    NUTRITION - FOCUSED PHYSICAL EXAM:  unable to complete at this time.   Diet Order:   Diet Order            Diet Carb Modified Fluid consistency: Thin; Room service appropriate? Yes  Diet effective now                 EDUCATION NEEDS:   Not appropriate for education at this time  Skin:  Skin Assessment: Reviewed RN Assessment  Last BM:  12/1 (type 6)  Height:   Ht Readings from Last 1 Encounters:  03/11/20 5\' 11"  (1.803 m)    Weight:   Wt Readings from Last 1 Encounters:  03/11/20 82.6 kg    Estimated Nutritional Needs:  Kcal:  2200-2400 kcal Protein:  110-120 grams Fluid:  >/= 2.2 L/day      Jarome Matin, MS, RD, LDN, CNSC Inpatient Clinical Dietitian RD pager # available in AMION  After hours/weekend pager # available in [facility]

## 2020-03-12 NOTE — Progress Notes (Signed)
  Echocardiogram 2D Echocardiogram with definity and 3D has been performed.  [name] M 03/12/2020, 9:06 AM

## 2020-03-12 NOTE — Progress Notes (Signed)
PROGRESS NOTE  [name]  VQQ:[id number] DOB: 27-Dec-1945 DOA: 03/11/2020 PCP: [name], MD  Outpatient Specialists: Oncology, Dr. [name] Brief Narrative: [name] is a 74 y.o. 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 Hgb was 6.7g/dl and glucose was 522 prior to taking usual insulin. On arrival ECG indicated atrial flutter with rate of 149bpm. Pancytopenia noted for which 1u PRBCs was ordered. After inadequate response to metoprolol, diltiazem infusion was started and the patient was admitted to PCU. HR has improved, though remains on cardizem drip with inadequate response to transfusion, hgb stable at 7.2g/dl. Additional 1u PRBC is ordered. Cardiology consulted with echo pending.  Assessment & Plan: Principal Problem:   New onset atrial flutter (HCC) Active Problems:   Chronic kidney disease, stage III (moderate) (HCC)   Uncontrolled type 2 diabetes mellitus with peripheral neuropathy (HCC)   Small cell lung cancer ([place])   Antineoplastic chemotherapy induced pancytopenia ([place])  New onset atrial flutter with RVR: Rate improved on diltiazem infusion.  - Transition to po rate control agent based on echocardiogram findings.  - Pt has no cardiologist, [facility] consulted for further recommendations.  - Monitor and maintain K, Mg levels - TSH wnl, free T4 elevated. Will recommend rechecking in 4-6 weeks.  - CHA2DS2-VASc score is 3, though thrombocytopenia with transfusion-dependent anemia limits ability to reduce risk with anticoagulation at this time.    Chemotherapy-induced pancytopenia:  - Monitor CBC  Symptomatic anemia, recurrent: No bleeding.  - Usually gets 2u PRBCs, had insufficient reponse to 1u PRBCs. Having decreased exertional stamina due to this, will repeat 1u PRBCs and recheck CBC in  AM.   Thrombocytopenia:  - Discussed risks of bleeding with anticoagulation for stroke risk reduction this AM with the patient. He agrees to withhold blood thinner while platelets are low.   Brittle IDT2DM:  - Continue insulin as ordered and adjust as necessary.   Hypokalemia:  - Supplemented  SCLC: s/p XRT - Dr. [name] added to treatment team. Continue Tx per his recommendations.  DVT prophylaxis: SCDs Code Status: Full Family Communication: None at bedside Disposition Plan:  Status is: Inpatient  Remains inpatient appropriate because:Hemodynamically unstable, Ongoing diagnostic testing needed not appropriate for outpatient work up and Inpatient level of care appropriate due to severity of illness  Dispo: The patient is from: Home              Anticipated d/c is to: Home              Anticipated d/c date is: 2 days              Patient currently is not medically stable to d/c.  Consultants:   Cardiology (unassigned)  Oncology, Dr. [name]  Procedures:   Echocardiogram pending  Antimicrobials:  None   Subjective: Feels more dyspneic with exertion than is his baseline, no significant change from when he was admitted. Denies bleeding, chest pain, orthopnea. No fevers.   Objective: Vitals:   03/11/20 2300 03/12/20 0003 03/12/20 0433 03/12/20 0758  BP: (!) 142/62 (!) 136/113 122/69 127/71  Pulse: (!) 115 (!) 57 (!) 104 98  Resp: 19 20 18 19   Temp:  99.1 F (37.3 C) 99.8 F (37.7 C) 98.9 F (37.2 C)  TempSrc:      SpO2: 94% 95% 95% 96%  Weight:  Height:        Intake/Output Summary (Last 24 hours) at 03/12/2020 1052 Last data filed at 03/12/2020 1046 Gross per 24 hour  Intake 508.21 ml  Output --  Net 508.21 ml   Filed Weights   03/11/20 1216  Weight: 82.6 kg    Gen: 74 y.o. male in no distress Pulm: Non-labored breathing room air. Clear to auscultation bilaterally.  CV: Irreg with flutter waves on telemetry, rate 80-110's at rest. No murmur,  rub, or gallop. No JVD, no pitting pedal edema. GI: Abdomen soft, non-tender, non-distended, with normoactive bowel sounds. No organomegaly or masses felt. Ext: Warm, no deformities Skin: No rashes, lesions or ulcers Neuro: Alert and oriented. No focal neurological deficits. Psych: Judgement and insight appear normal. Mood & affect appropriate.   Data Reviewed: I have personally reviewed following labs and imaging studies  CBC: Recent Labs  Lab 03/11/20 1004 03/11/20 1242 03/12/20 0608  WBC 2.8* 3.3* 1.9*  NEUTROABS 2.4  --   --   HGB 6.7* 7.2* 7.2*  HCT 19.9* 21.8* 20.8*  MCV 88.4 90.1 88.9  PLT 55* 65* 49*   Basic Metabolic Panel: Recent Labs  Lab 03/11/20 1004 03/11/20 1242 03/11/20 2158 03/11/20 2205 03/12/20 0608  NA 129* 125* 131*  --  129*  K 3.8 3.3* 3.6  --  4.1  CL 93* 91* 94*  --  94*  CO2 24 25 26   --  26  GLUCOSE 502* 522* 139*  --  272*  BUN 17 19 16   --  16  CREATININE 1.30* 1.40* 1.04  --  1.21  CALCIUM 8.3* 7.7* 7.6*  --  7.6*  MG  --   --   --  1.2*  --    GFR: Estimated Creatinine Clearance: 57 mL/min (by C-G formula based on SCr of 1.21 mg/dL). Liver Function Tests: Recent Labs  Lab 03/11/20 1004  AST <6*  ALT 7  ALKPHOS 100  BILITOT 0.7  PROT 5.8*  ALBUMIN 2.1*   No results for input(s): LIPASE, AMYLASE in the last 168 hours. No results for input(s): AMMONIA in the last 168 hours. Coagulation Profile: No results for input(s): INR, PROTIME in the last 168 hours. Cardiac Enzymes: No results for input(s): CKTOTAL, CKMB, CKMBINDEX, TROPONINI in the last 168 hours. BNP (last 3 results) No results for input(s): PROBNP in the last 8760 hours. HbA1C: Recent Labs    03/11/20 2153  HGBA1C 8.7*   CBG: Recent Labs  Lab 03/11/20 1537 03/11/20 1809 03/11/20 2035 03/11/20 2205 03/12/20 0806  GLUCAP 219* 69* 91 133* 284*   Lipid Profile: No results for input(s): CHOL, HDL, LDLCALC, TRIG, CHOLHDL, LDLDIRECT in the last 72  hours. Thyroid Function Tests: Recent Labs    03/11/20 2140  TSH 1.077  FREET4 1.35*   Anemia Panel: No results for input(s): VITAMINB12, FOLATE, FERRITIN, TIBC, IRON, RETICCTPCT in the last 72 hours. Urine analysis:    Component Value Date/Time   COLORURINE YELLOW 03/11/2020 1556   APPEARANCEUR HAZY (A) 03/11/2020 1556   LABSPEC 1.012 03/11/2020 1556   PHURINE 5.0 03/11/2020 1556   GLUCOSEU >=500 (A) 03/11/2020 1556   HGBUR MODERATE (A) 03/11/2020 1556   BILIRUBINUR NEGATIVE 03/11/2020 1556   BILIRUBINUR 1+ 09/10/2014 1615   KETONESUR NEGATIVE 03/11/2020 1556   PROTEINUR 100 (A) 03/11/2020 1556   UROBILINOGEN 0.2 12/05/2014 2146   NITRITE NEGATIVE 03/11/2020 1556   LEUKOCYTESUR NEGATIVE 03/11/2020 1556   Recent Results (from the past 240 hour(s))  Resp Panel  by RT-PCR (Flu A&B, Covid) Nasopharyngeal Swab     Status: None   Collection Time: 03/11/20  1:04 PM   Specimen: Nasopharyngeal Swab; Nasopharyngeal(NP) swabs in vial transport medium  Result Value Ref Range Status   SARS Coronavirus 2 by RT PCR NEGATIVE NEGATIVE Final    Comment: (NOTE) SARS-CoV-2 target nucleic acids are NOT DETECTED.  The SARS-CoV-2 RNA is generally detectable in upper respiratory specimens during the acute phase of infection. The lowest concentration of SARS-CoV-2 viral copies this assay can detect is 138 copies/mL. A negative result does not preclude SARS-Cov-2 infection and should not be used as the sole basis for treatment or other patient management decisions. A negative result may occur with  improper specimen collection/handling, submission of specimen other than nasopharyngeal swab, presence of viral mutation(s) within the areas targeted by this assay, and inadequate number of viral copies(<138 copies/mL). A negative result must be combined with clinical observations, patient history, and epidemiological information. The expected result is Negative.  Fact Sheet for Patients:   EntrepreneurPulse.com.au  Fact Sheet for Healthcare Providers:  IncredibleEmployment.be  This test is no t yet approved or cleared by the [place] FDA and  has been authorized for detection and/or diagnosis of SARS-CoV-2 by FDA under an Emergency Use Authorization (EUA). This EUA will remain  in effect (meaning this test can be used) for the duration of the COVID-19 declaration under Section 564(b)(1) of the Act, 21 U.S.C.section 360bbb-3(b)(1), unless the authorization is terminated  or revoked sooner.       Influenza A by PCR NEGATIVE NEGATIVE Final   Influenza B by PCR NEGATIVE NEGATIVE Final    Comment: (NOTE) The Xpert Xpress SARS-CoV-2/FLU/RSV plus assay is intended as an aid in the diagnosis of influenza from Nasopharyngeal swab specimens and should not be used as a sole basis for treatment. Nasal washings and aspirates are unacceptable for Xpert Xpress SARS-CoV-2/FLU/RSV testing.  Fact Sheet for Patients: EntrepreneurPulse.com.au  Fact Sheet for Healthcare Providers: IncredibleEmployment.be  This test is not yet approved or cleared by the [place] FDA and has been authorized for detection and/or diagnosis of SARS-CoV-2 by FDA under an Emergency Use Authorization (EUA). This EUA will remain in effect (meaning this test can be used) for the duration of the COVID-19 declaration under Section 564(b)(1) of the Act, 21 U.S.C. section 360bbb-3(b)(1), unless the authorization is terminated or revoked.  Performed at [facility], [place] [address] [place], [place] [address]       Radiology Studies: No results found.  Scheduled Meds: . sodium chloride   Intravenous Once  . sodium chloride   Intravenous Once  . Chlorhexidine Gluconate Cloth  6 each Topical Daily  . donepezil  10 mg Oral QHS  . fluticasone  2 spray Each Nare Daily  . insulin aspart  0-20 Units  Subcutaneous TID WC  . insulin aspart  0-5 Units Subcutaneous QHS  . insulin aspart  10 Units Subcutaneous TID WC  . insulin glargine  10 Units Subcutaneous Daily  . memantine  10 mg Oral BID  . montelukast  10 mg Oral QHS  . pantoprazole  20 mg Oral BID  . potassium chloride  40 mEq Oral BID  . simvastatin  40 mg Oral QHS  . sucralfate  1 g Oral TID AC & HS  . venlafaxine XR  150 mg Oral QHS  . venlafaxine XR  75 mg Oral Q breakfast   Continuous Infusions: . diltiazem (CARDIZEM) infusion 10 mg/hr (03/12/20 2841)  LOS: 0 days   Time spent: 35 minutes.  Patrecia Pour, MD [facility] [email or web address] 03/12/2020, 10:52 AM

## 2020-03-13 DIAGNOSIS — D649 Anemia, unspecified: Secondary | ICD-10-CM | POA: Diagnosis not present

## 2020-03-13 DIAGNOSIS — I10 Essential (primary) hypertension: Secondary | ICD-10-CM | POA: Diagnosis not present

## 2020-03-13 DIAGNOSIS — I4892 Unspecified atrial flutter: Secondary | ICD-10-CM | POA: Diagnosis not present

## 2020-03-13 LAB — TYPE AND SCREEN
ABO/RH(D): A POS
Antibody Screen: NEGATIVE
Unit division: 0
Unit division: 0

## 2020-03-13 LAB — BASIC METABOLIC PANEL
Anion gap: 9 (ref 5–15)
BUN: 23 mg/dL (ref 8–23)
CO2: 25 mmol/L (ref 22–32)
Calcium: 7.9 mg/dL — ABNORMAL LOW (ref 8.9–10.3)
Chloride: 96 mmol/L — ABNORMAL LOW (ref 98–111)
Creatinine, Ser: 1.37 mg/dL — ABNORMAL HIGH (ref 0.61–1.24)
GFR, Estimated: 54 mL/min — ABNORMAL LOW (ref >60)
Glucose, Bld: 284 mg/dL — ABNORMAL HIGH (ref 70–99)
Potassium: 4.2 mmol/L (ref 3.5–5.1)
Sodium: 130 mmol/L — ABNORMAL LOW (ref 135–145)

## 2020-03-13 LAB — BPAM RBC
Blood Product Expiration Date: 202112102359
Blood Product Expiration Date: 202112192359
ISSUE DATE / TIME: 202111301648
ISSUE DATE / TIME: 202112011329
Unit Type and Rh: 6200
Unit Type and Rh: 6200

## 2020-03-13 LAB — CBC
HCT: 23.5 % — ABNORMAL LOW (ref 39.0–52.0)
Hemoglobin: 7.9 g/dL — ABNORMAL LOW (ref 13.0–17.0)
MCH: 30.4 pg (ref 26.0–34.0)
MCHC: 33.6 g/dL (ref 30.0–36.0)
MCV: 90.4 fL (ref 80.0–100.0)
Platelets: 34 10*3/uL — ABNORMAL LOW (ref 150–400)
RBC: 2.6 MIL/uL — ABNORMAL LOW (ref 4.22–5.81)
RDW: 14.2 % (ref 11.5–15.5)
WBC: 1.4 10*3/uL — CL (ref 4.0–10.5)
nRBC: 0 % (ref 0.0–0.2)

## 2020-03-13 LAB — GLUCOSE, CAPILLARY
Glucose-Capillary: 283 mg/dL — ABNORMAL HIGH (ref 70–99)
Glucose-Capillary: 202 mg/dL — ABNORMAL HIGH (ref 70–99)
Glucose-Capillary: 64 mg/dL — ABNORMAL LOW (ref 70–99)
Glucose-Capillary: 73 mg/dL (ref 70–99)
Glucose-Capillary: 127 mg/dL — ABNORMAL HIGH (ref 70–99)
Glucose-Capillary: 182 mg/dL — ABNORMAL HIGH (ref 70–99)

## 2020-03-13 LAB — MAGNESIUM: Magnesium: 1.5 mg/dL — ABNORMAL LOW (ref 1.7–2.4)

## 2020-03-13 MED ORDER — METOPROLOL SUCCINATE ER 50 MG PO TB24
50.0000 mg | ORAL_TABLET | Freq: Every day | ORAL | Status: DC
  Administered 2020-03-14: 50 mg via ORAL
  Filled 2020-03-13: qty 1

## 2020-03-13 MED ORDER — METOPROLOL TARTRATE 25 MG PO TABS
25.0000 mg | ORAL_TABLET | Freq: Once | ORAL | Status: AC
  Administered 2020-03-13: 25 mg via ORAL
  Filled 2020-03-13: qty 1

## 2020-03-13 MED ORDER — LOPERAMIDE HCL 2 MG PO CAPS
4.0000 mg | ORAL_CAPSULE | ORAL | Status: DC | PRN
  Administered 2020-03-13 – 2020-03-14 (×4): 4 mg via ORAL
  Filled 2020-03-13 (×4): qty 2

## 2020-03-13 MED ORDER — HEPARIN SOD (PORK) LOCK FLUSH 100 UNIT/ML IV SOLN
500.0000 [IU] | INTRAVENOUS | Status: AC | PRN
  Administered 2020-03-13: 500 [IU]

## 2020-03-13 NOTE — Plan of Care (Signed)
Patient has been stable this shift by evidence of assessment. Heart rate has improved, 80s this shift. No c/o pain and continues to be on room air. Patient has had several loose Bms this shift,treated with PRN meds. Problem: Clinical Measurements: Goal: Ability to maintain clinical measurements within normal limits will improve Outcome: Progressing Goal: Will remain free from infection Outcome: Progressing Goal: Diagnostic test results will improve Outcome: Progressing Goal: Cardiovascular complication will be avoided Outcome: Progressing   Problem: Activity: Goal: Risk for activity intolerance will decrease Outcome: Progressing   Problem: Nutrition: Goal: Adequate nutrition will be maintained Outcome: Progressing   Problem: Elimination: Goal: Will not experience complications related to bowel motility Outcome: Progressing Goal: Will not experience complications related to urinary retention Outcome: Progressing   Problem: Pain Managment: Goal: General experience of comfort will improve Outcome: Progressing   Problem: Safety: Goal: Ability to remain free from injury will improve Outcome: Progressing   Problem: Skin Integrity: Goal: Risk for impaired skin integrity will decrease Outcome: Progressing

## 2020-03-13 NOTE — Progress Notes (Signed)
Progress Note  Patient Name: [name] Date of Encounter: 03/13/2020  Primary Cardiologist: New to [facility]-Dr. Radford Pax, MD   Subjective   Denies any chest pain or SOB.  Plt ct still low at 34K.    Inpatient Medications    Scheduled Meds: . (feeding supplement) PROSource Plus  30 mL Oral Daily  . Chlorhexidine Gluconate Cloth  6 each Topical Daily  . donepezil  10 mg Oral QHS  . feeding supplement (GLUCERNA SHAKE)  237 mL Oral BID BM  . fluticasone  2 spray Each Nare Daily  . insulin aspart  0-20 Units Subcutaneous TID WC  . insulin aspart  0-5 Units Subcutaneous QHS  . insulin aspart  10 Units Subcutaneous TID WC  . insulin glargine  10 Units Subcutaneous Daily  . memantine  10 mg Oral BID  . metoprolol tartrate  25 mg Oral BID  . montelukast  10 mg Oral QHS  . pantoprazole  20 mg Oral BID  . simvastatin  40 mg Oral QHS  . sucralfate  1 g Oral TID AC & HS  . venlafaxine XR  150 mg Oral QHS  . venlafaxine XR  75 mg Oral Q breakfast   Continuous Infusions: . diltiazem (CARDIZEM) infusion 5 mg/hr (03/12/20 2134)   PRN Meds: acetaminophen, albuterol, ondansetron (ZOFRAN) IV, prochlorperazine, sodium chloride flush   Vital Signs    Vitals:   03/12/20 1440 03/12/20 1500 03/12/20 1708 03/12/20 2118  BP: (!) 107/52  114/61 137/62  Pulse: 85  85 87  Resp: 19 11 13 18   Temp: 98.3 F (36.8 C)  98.3 F (36.8 C) 98.4 F (36.9 C)  TempSrc: Oral  Oral Oral  SpO2: 98%  98% 95%  Weight:      Height:        Intake/Output Summary (Last 24 hours) at 03/13/2020 1019 Last data filed at 03/12/2020 1842 Gross per 24 hour  Intake 1564.47 ml  Output 250 ml  Net 1314.47 ml   Filed Weights   03/11/20 1216  Weight: 82.6 kg    Physical Exam   General: Well developed, well nourished, NAD Skin: Warm, dry, intact  Head: Normocephalic, atraumatic, sclera non-icteric, no xanthomas, clear, moist mucus membranes. Neck: Negative for carotid bruits. No JVD Lungs:Clear to  ausculation bilaterally. No wheezes, rales, or rhonchi. Breathing is unlabored. Cardiovascular: RRR with S1 S2. No murmurs, rubs, gallops, or LV heave appreciated. Abdomen: Soft, non-tender, non-distended with normoactive bowel sounds. No hepatomegaly, No rebound/guarding. No obvious abdominal masses. MSK: Strength and tone appear normal for age. 5/5 in all extremities Extremities: No edema. No clubbing or cyanosis. DP/PT pulses 2+ bilaterally Neuro: Alert and oriented. No focal deficits. No facial asymmetry. MAE spontaneously. Psych: Responds to questions appropriately with normal affect.    Labs    Chemistry Recent Labs  Lab 03/11/20 1004 03/11/20 1242 03/11/20 2158 03/12/20 0608 03/13/20 0445  NA 129*   < > 131* 129* 130*  K 3.8   < > 3.6 4.1 4.2  CL 93*   < > 94* 94* 96*  CO2 24   < > 26 26 25   GLUCOSE 502*   < > 139* 272* 284*  BUN 17   < > 16 16 23   CREATININE 1.30*   < > 1.04 1.21 1.37*  CALCIUM 8.3*   < > 7.6* 7.6* 7.9*  PROT 5.8*  --   --   --   --   ALBUMIN 2.1*  --   --   --   --  AST <6*  --   --   --   --   ALT 7  --   --   --   --   ALKPHOS 100  --   --   --   --   BILITOT 0.7  --   --   --   --   GFRNONAA 58*   < > >60 >60 54*  ANIONGAP 12   < > 11 9 9    < > = values in this interval not displayed.     Hematology Recent Labs  Lab 03/11/20 1242 03/11/20 1242 03/12/20 0608 03/12/20 2135 03/13/20 0445  WBC 3.3*  --  1.9*  --  1.4*  RBC 2.42*  --  2.34*  --  2.60*  HGB 7.2*   < > 7.2* 7.9* 7.9*  HCT 21.8*   < > 20.8* 22.6* 23.5*  MCV 90.1  --  88.9  --  90.4  MCH 29.8  --  30.8  --  30.4  MCHC 33.0  --  34.6  --  33.6  RDW 14.5  --  14.5  --  14.2  PLT 65*  --  49*  --  34*   < > = values in this interval not displayed.    Cardiac EnzymesNo results for input(s): TROPONINI in the last 168 hours. No results for input(s): TROPIPOC in the last 168 hours.   BNPNo results for input(s): BNP, PROBNP in the last 168 hours.   DDimer No results for  input(s): DDIMER in the last 168 hours.   Radiology    ECHOCARDIOGRAM COMPLETE  Result Date: 03/12/2020    ECHOCARDIOGRAM REPORT   Patient Name:   [name] Date of Exam: 03/12/2020 Medical Rec #:  [id number]       Height:       71.0 in Accession #:    [id number]      Weight:       182.0 lb Date of Birth:  1946-03-28       BSA:          2.026 m Patient Age:    74 years        BP:           127/71 mmHg Patient Gender: M               HR:           98 bpm. Exam Location:  Inpatient Procedure: 2D Echo, 3D Echo, Color Doppler, Cardiac Doppler and Intracardiac            Opacification Agent Indications:    Atrial Flutter 427.32 / I48.92  History:        Patient has prior history of Echocardiogram examinations, most                 recent 12/24/2014. Risk Factors:Hypertension, Diabetes and                 Dyslipidemia. Small cell carcinoma of right lung. Chronic kidney                 disease, stage III.  Sonographer:    Darlina Sicilian RDCS Referring Phys: [phone] JARED M GARDNER  Sonographer Comments: No parasternal window and suboptimal apical window. IMPRESSIONS  1. Left ventricular ejection fraction, by estimation, is 65 to 70%. The left ventricle has hyperdynamic function. The left ventricle has no regional wall motion abnormalities. There is mild concentric left ventricular hypertrophy. Left ventricular diastolic  parameters are indeterminate.  2. Right ventricular systolic function is normal. The right ventricular size is normal. Mildly increased right ventricular wall thickness.  3. The mitral valve is grossly normal. No evidence of mitral valve regurgitation.  4. The aortic valve is tricuspid. There is mild calcification of the aortic valve. Aortic valve regurgitation is not visualized.  5. The inferior vena cava is normal in size with greater than 50% respiratory variability, suggesting right atrial pressure of 3 mmHg. Comparison(s): A prior study was performed on 12/24/2014. No significant change from prior  study. FINDINGS  Left Ventricle: Left ventricular ejection fraction, by estimation, is 65 to 70%. The left ventricle has hyperdynamic function. The left ventricle has no regional wall motion abnormalities. Definity contrast agent was given IV to delineate the left ventricular endocardial borders. The left ventricular internal cavity size was small. There is mild concentric left ventricular hypertrophy. Left ventricular diastolic parameters are indeterminate. Right Ventricle: The right ventricular size is normal. Mildly increased right ventricular wall thickness. Right ventricular systolic function is normal. Left Atrium: Left atrial size was normal in size. Right Atrium: Right atrial size was normal in size. Pericardium: Trivial pericardial effusion is present. Mitral Valve: The mitral valve is grossly normal. No evidence of mitral valve regurgitation. Tricuspid Valve: The tricuspid valve is normal in structure. Tricuspid valve regurgitation is not demonstrated. Aortic Valve: The aortic valve is tricuspid. There is mild calcification of the aortic valve. Aortic valve regurgitation is not visualized. Pulmonic Valve: The pulmonic valve was not well visualized. Pulmonic valve regurgitation is not visualized. Aorta: The aortic root is normal in size and structure. Venous: The pulmonary veins were not well visualized. The inferior vena cava is normal in size with greater than 50% respiratory variability, suggesting right atrial pressure of 3 mmHg. IAS/Shunts: The atrial septum is grossly normal.  LEFT VENTRICLE PLAX 2D LVIDd:         3.60 cm  Diastology LVIDs:         2.60 cm  LV e' medial:    10.80 cm/s LV PW:         1.10 cm  LV E/e' medial:  10.9 LV IVS:        1.10 cm  LV e' lateral:   18.30 cm/s LVOT diam:     2.10 cm  LV E/e' lateral: 6.4 LV SV:         47 LV SV Index:   23 LVOT Area:     3.46 cm                         3D Volume EF                         LV 3D EF:    71.20 %                         LV 3D EDV:    85200.00 mm                         LV 3D ESV:   24600.00 mm                         LV 3D SV:    60700.00 mm RIGHT VENTRICLE RV S prime:     16.80 cm/s TAPSE (M-mode): 1.4 cm LEFT ATRIUM  Index       RIGHT ATRIUM           Index LA diam:        3.30 cm 1.63 cm/m  RA Area:     11.80 cm LA Vol (A2C):   32.2 ml 15.90 ml/m RA Volume:   21.90 ml  10.81 ml/m LA Vol (A4C):   32.8 ml 16.19 ml/m LA Biplane Vol: 34.0 ml 16.78 ml/m  AORTIC VALVE LVOT Vmax:   93.50 cm/s LVOT Vmean:  66.500 cm/s LVOT VTI:    0.136 m  AORTA Ao Root diam: 2.80 cm MITRAL VALVE MV Area (PHT): 4.80 cm     SHUNTS MV Decel Time: 158 msec     Systemic VTI:  0.14 m MV E velocity: 118.00 cm/s  Systemic Diam: 2.10 cm MV A velocity: 46.30 cm/s MV E/A ratio:  2.55 Rudean Haskell MD Electronically signed by Rudean Haskell MD Signature Date/Time: 03/12/2020/3:52:17 PM    Final    Telemetry    AF with rates in the 80-100's- Personally Reviewed  ECG     No new tracing as of 03/13/20- Personally Reviewed  Cardiac Studies   Echocardiogram  03/12/20:  1. Left ventricular ejection fraction, by estimation, is 65 to 70%. The  left ventricle has hyperdynamic function. The left ventricle has no  regional wall motion abnormalities. There is mild concentric left  ventricular hypertrophy. Left ventricular  diastolic parameters are indeterminate.  2. Right ventricular systolic function is normal. The right ventricular  size is normal. Mildly increased right ventricular wall thickness.  3. The mitral valve is grossly normal. No evidence of mitral valve  regurgitation.  4. The aortic valve is tricuspid. There is mild calcification of the  aortic valve. Aortic valve regurgitation is not visualized.  5. The inferior vena cava is normal in size with greater than 50%  respiratory variability, suggesting right atrial pressure of 3 mmHg.   Comparison(s): A prior study was performed on 12/24/2014. No significant  change  from prior study.   Patient Profile     74 y.o. male with a PMH of non-obstructive CAD, HTN, brittle DM type 2, CKD stage 3, and SCLC s/p XRT and currently on chemotherapy c/b pancytopenia requiring blood transfusions, who is being seen today for the evaluation of new onset atrial flutter at the request of Dr. Bonner Puna.  Assessment & Plan    1. New onset atrial flutter:  -Pt presented after receiving blood transfusion for a Hb of 6.7 due to recent chemo and pancytopenia and was incidentally found to be in atrial flutter with RVR with rates up to 150s on EKG of unknown duration.  -Subsequently he was started on a diltiazem gtt and transitioned to lopressor -CHA2DS2-VASc Score = 4 -Unable to pursue TEE/DCCV given limitations of anticoagulation at this time>>will have him follow with OP AF Clinic in 7-10 days for re-assessment  -Oncology not recommending anticoagulation at this time until Plt count greater then 50,000>>>will defer AC start to TRH once more stable with platelets -change Lopressor 25mg  BID to Toprol XL 50mg  daily  2. Symptomatic anemia:  -related to pancytopenia from recent chemo (has completed last chemo session) -Hgb 6.7 in the OP setting>>>recieved PRBC infusion>>Hb 7.9 today  -Continue management per primary team  3. Non-obstructive CAD:  -LHC in 2013 showed a 20% RCA stenosis.  -No recent anginal complaints. EKG without overt ischemic changes.  -Would continue to hold aspirin given ongoing anemia and need to start [place]ads for aflutter -  Continue statin  4. HTN:  -Stable, 137/62>>114/61>>>107/52>>123/46mmHg -Continue management in the context of #1  5. HLD:  -LDL 82 12/2018; goal <70 given CAD history -Continue statin  6. DM type 2:  -BG up to 500s on admission and as low as 69. A1C 8.7; goal <7 -Continue management per primary team   I have spent a total of 35 minutes with patient reviewing telemetry, EKGs, labs and examining patient as well as establishing an  assessment and plan that was discussed with the patient.  > 50% of time was spent in direct patient care.    [facility] will sign off.   Medication Recommendations:  Toprol XL 50mg  daily.  Start Eliquis once Plt Ct > 50K Other recommendations (labs, testing, etc):  none Follow up as an outpatient:  afib clinic 12/8  Signed: Fransico Him, MD [facility] 03/13/2020

## 2020-03-13 NOTE — Progress Notes (Signed)
PROGRESS NOTE  [name]  PFX:[id number] DOB: Aug 24, 1945 DOA: 03/11/2020 PCP: [name], MD  Outpatient Specialists: Oncology, Dr. [name] Brief Narrative: [name] is a 74 y.o. 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 Hgb was 6.7g/dl and glucose was 522 prior to taking usual insulin. On arrival ECG indicated atrial flutter with rate of 149bpm. Pancytopenia noted for which 1u PRBCs was ordered. After inadequate response to metoprolol, diltiazem infusion was started and the patient was admitted to PCU. HR has improved, though remains on cardizem drip with inadequate response to transfusion, hgb stable at 7.2g/dl. Additional 1u PRBC is ordered. Cardiology consulted with echo pending.  Assessment & Plan:  New onset atrial flutter with RVR: Rate improved on diltiazem infusion.  - Transition to po rate control agent based on echocardiogram findings.  - Pt has no cardiologist, [facility] consulted for further recommendations.  - Diltiazem stopped today given resolution symptomatic continue metoprolol - TSH wnl, free T4 elevated. Will recommend rechecking in 4-6 weeks.  - CHA2DS2-VASc score is 3, though thrombocytopenia with transfusion-dependent anemia limits ability to reduce risk with anticoagulation at this time.    Chemotherapy-induced pancytopenia:  - CBC continues to worsen - defer to hemo-onc for treatment/pause in treament  Symptomatic anemia, recurrent: No bleeding.  - Continue transfusion as indicated -Currently asymptomatic, heme-onc following along, appreciate insight and recommendations  Thrombocytopenia:  - Discussed risks of bleeding with anticoagulation for stroke risk reduction this AM with the patient. He agrees to withhold blood thinner while platelets are low -Platelets continue to  downtrend as above  Brittle IDT2DM:  - Continue insulin as ordered and adjust as necessary.   Hypokalemia:  -Currently within normal limits  SCLC: s/p XRT - Dr. [name] added to treatment team. Continue Tx per his recommendations.  DVT prophylaxis: SCDs Code Status: Full Family Communication: None at bedside Disposition Plan:  Status is: Inpatient  Remains inpatient appropriate because:Hemodynamically unstable, Ongoing diagnostic testing needed not appropriate for outpatient work up and Inpatient level of care appropriate due to severity of illness  Dispo: The patient is from: Home              Anticipated d/c is to: Home              Anticipated d/c date is: 2 days              Patient currently is not medically stable to d/c.  Consultants:   Cardiology (unassigned)  Oncology, Dr. [name]  Procedures:   Echocardiogram pending  Antimicrobials:  None   Subjective: No acute issues or events overnight, feeling much better today after blood transfusion previously, otherwise denies fever, chills, bleeding, nausea, vomiting.   Objective: Vitals:   03/12/20 1440 03/12/20 1500 03/12/20 1708 03/12/20 2118  BP: (!) 107/52  114/61 137/62  Pulse: 85  85 87  Resp: 19 11 13 18   Temp: 98.3 F (36.8 C)  98.3 F (36.8 C) 98.4 F (36.9 C)  TempSrc: Oral  Oral Oral  SpO2: 98%  98% 95%  Weight:      Height:        Intake/Output Summary (Last 24 hours) at 03/13/2020 0749 Last data filed at 03/12/2020 1842 Gross per 24 hour  Intake 1564.47 ml  Output 250 ml  Net 1314.47 ml   [employer]  03/11/20 1216  Weight: 82.6 kg    Gen: 74 y.o. male in no distress Pulm: Non-labored breathing room air. Clear to auscultation bilaterally.  CV: Irreg with flutter waves on telemetry, rate 80-110's at rest. No murmur, rub, or gallop. No JVD, no pitting pedal edema. GI: Abdomen soft, non-tender, non-distended, with normoactive bowel sounds. No organomegaly or masses felt. Ext: Warm,  no deformities Skin: No rashes, lesions or ulcers Neuro: Alert and oriented. No focal neurological deficits. Psych: Judgement and insight appear normal. Mood & affect appropriate.   Data Reviewed: I have personally reviewed following labs and imaging studies  CBC: Recent Labs  Lab 03/11/20 1004 03/11/20 1242 03/12/20 0608 03/12/20 2135 03/13/20 0445  WBC 2.8* 3.3* 1.9*  --  1.4*  NEUTROABS 2.4  --   --   --   --   HGB 6.7* 7.2* 7.2* 7.9* 7.9*  HCT 19.9* 21.8* 20.8* 22.6* 23.5*  MCV 88.4 90.1 88.9  --  90.4  PLT 55* 65* 49*  --  34*   Basic Metabolic Panel: Recent Labs  Lab 03/11/20 1004 03/11/20 1242 03/11/20 2158 03/11/20 2205 03/12/20 0608 03/13/20 0445  NA 129* 125* 131*  --  129* 130*  K 3.8 3.3* 3.6  --  4.1 4.2  CL 93* 91* 94*  --  94* 96*  CO2 24 25 26   --  26 25  GLUCOSE 502* 522* 139*  --  272* 284*  BUN 17 19 16   --  16 23  CREATININE 1.30* 1.40* 1.04  --  1.21 1.37*  CALCIUM 8.3* 7.7* 7.6*  --  7.6* 7.9*  MG  --   --   --  1.2*  --  1.5*   GFR: Estimated Creatinine Clearance: 50.4 mL/min (A) (by C-G formula based on SCr of 1.37 mg/dL (H)). Liver Function Tests: Recent Labs  Lab 03/11/20 1004  AST <6*  ALT 7  ALKPHOS 100  BILITOT 0.7  PROT 5.8*  ALBUMIN 2.1*   No results for input(s): LIPASE, AMYLASE in the last 168 hours. No results for input(s): AMMONIA in the last 168 hours. Coagulation Profile: No results for input(s): INR, PROTIME in the last 168 hours. Cardiac Enzymes: No results for input(s): CKTOTAL, CKMB, CKMBINDEX, TROPONINI in the last 168 hours. BNP (last 3 results) No results for input(s): PROBNP in the last 8760 hours. HbA1C: Recent Labs    03/11/20 2153  HGBA1C 8.7*   CBG: Recent Labs  Lab 03/12/20 0806 03/12/20 1143 03/12/20 1701 03/12/20 2114 03/13/20 0720  GLUCAP 284* 169* 96 193* 283*   Lipid Profile: No results for input(s): CHOL, HDL, LDLCALC, TRIG, CHOLHDL, LDLDIRECT in the last 72 hours. Thyroid Function  Tests: Recent Labs    03/11/20 2140  TSH 1.077  FREET4 1.35*   Anemia Panel: No results for input(s): VITAMINB12, FOLATE, FERRITIN, TIBC, IRON, RETICCTPCT in the last 72 hours. Urine analysis:    Component Value Date/Time   COLORURINE YELLOW 03/11/2020 1556   APPEARANCEUR HAZY (A) 03/11/2020 1556   LABSPEC 1.012 03/11/2020 1556   PHURINE 5.0 03/11/2020 1556   GLUCOSEU >=500 (A) 03/11/2020 1556   HGBUR MODERATE (A) 03/11/2020 1556   BILIRUBINUR NEGATIVE 03/11/2020 1556   BILIRUBINUR 1+ 09/10/2014 1615   KETONESUR NEGATIVE 03/11/2020 1556   PROTEINUR 100 (A) 03/11/2020 1556   UROBILINOGEN 0.2 12/05/2014 2146   NITRITE NEGATIVE 03/11/2020 1556   LEUKOCYTESUR NEGATIVE 03/11/2020 1556   Recent Results (from the past 240 hour(s))  Resp Panel by RT-PCR (Flu A&B,  Covid) Nasopharyngeal Swab     Status: None   Collection Time: 03/11/20  1:04 PM   Specimen: Nasopharyngeal Swab; Nasopharyngeal(NP) swabs in vial transport medium  Result Value Ref Range Status   SARS Coronavirus 2 by RT PCR NEGATIVE NEGATIVE Final    Comment: (NOTE) SARS-CoV-2 target nucleic acids are NOT DETECTED.  The SARS-CoV-2 RNA is generally detectable in upper respiratory specimens during the acute phase of infection. The lowest concentration of SARS-CoV-2 viral copies this assay can detect is 138 copies/mL. A negative result does not preclude SARS-Cov-2 infection and should not be used as the sole basis for treatment or other patient management decisions. A negative result may occur with  improper specimen collection/handling, submission of specimen other than nasopharyngeal swab, presence of viral mutation(s) within the areas targeted by this assay, and inadequate number of viral copies(<138 copies/mL). A negative result must be combined with clinical observations, patient history, and epidemiological information. The expected result is Negative.  Fact Sheet for Patients:   EntrepreneurPulse.com.au  Fact Sheet for Healthcare Providers:  IncredibleEmployment.be  This test is no t yet approved or cleared by the [place] FDA and  has been authorized for detection and/or diagnosis of SARS-CoV-2 by FDA under an Emergency Use Authorization (EUA). This EUA will remain  in effect (meaning this test can be used) for the duration of the COVID-19 declaration under Section 564(b)(1) of the Act, 21 U.S.C.section 360bbb-3(b)(1), unless the authorization is terminated  or revoked sooner.       Influenza A by PCR NEGATIVE NEGATIVE Final   Influenza B by PCR NEGATIVE NEGATIVE Final    Comment: (NOTE) The Xpert Xpress SARS-CoV-2/FLU/RSV plus assay is intended as an aid in the diagnosis of influenza from Nasopharyngeal swab specimens and should not be used as a sole basis for treatment. Nasal washings and aspirates are unacceptable for Xpert Xpress SARS-CoV-2/FLU/RSV testing.  Fact Sheet for Patients: EntrepreneurPulse.com.au  Fact Sheet for Healthcare Providers: IncredibleEmployment.be  This test is not yet approved or cleared by the [place] FDA and has been authorized for detection and/or diagnosis of SARS-CoV-2 by FDA under an Emergency Use Authorization (EUA). This EUA will remain in effect (meaning this test can be used) for the duration of the COVID-19 declaration under Section 564(b)(1) of the Act, 21 U.S.C. section 360bbb-3(b)(1), unless the authorization is terminated or revoked.  Performed at [facility] [place] [facility], [place] [address] [place], [place] [address]       Radiology Studies: ECHOCARDIOGRAM COMPLETE  Result Date: 03/12/2020    ECHOCARDIOGRAM REPORT   Patient Name:   [name] Date of Exam: 03/12/2020 Medical Rec #:  [id number]       Height:       71.0 in Accession #:    [id number]      Weight:       182.0 lb Date of Birth:  May 04, 1945       BSA:           2.026 m Patient Age:    74 years        BP:           127/71 mmHg Patient Gender: M               HR:           98 bpm. Exam Location:  Inpatient Procedure: 2D Echo, 3D Echo, Color Doppler, Cardiac Doppler and Intracardiac            Opacification Agent Indications:  Atrial Flutter 427.32 / I48.92  History:        Patient has prior history of Echocardiogram examinations, most                 recent 12/24/2014. Risk Factors:Hypertension, Diabetes and                 Dyslipidemia. Small cell carcinoma of right lung. Chronic kidney                 disease, stage III.  Sonographer:    Darlina Sicilian RDCS Referring Phys: [phone] JARED M GARDNER  Sonographer Comments: No parasternal window and suboptimal apical window. IMPRESSIONS  1. Left ventricular ejection fraction, by estimation, is 65 to 70%. The left ventricle has hyperdynamic function. The left ventricle has no regional wall motion abnormalities. There is mild concentric left ventricular hypertrophy. Left ventricular diastolic parameters are indeterminate.  2. Right ventricular systolic function is normal. The right ventricular size is normal. Mildly increased right ventricular wall thickness.  3. The mitral valve is grossly normal. No evidence of mitral valve regurgitation.  4. The aortic valve is tricuspid. There is mild calcification of the aortic valve. Aortic valve regurgitation is not visualized.  5. The inferior vena cava is normal in size with greater than 50% respiratory variability, suggesting right atrial pressure of 3 mmHg. Comparison(s): A prior study was performed on 12/24/2014. No significant change from prior study. FINDINGS  Left Ventricle: Left ventricular ejection fraction, by estimation, is 65 to 70%. The left ventricle has hyperdynamic function. The left ventricle has no regional wall motion abnormalities. Definity contrast agent was given IV to delineate the left ventricular endocardial borders. The left ventricular internal cavity size  was small. There is mild concentric left ventricular hypertrophy. Left ventricular diastolic parameters are indeterminate. Right Ventricle: The right ventricular size is normal. Mildly increased right ventricular wall thickness. Right ventricular systolic function is normal. Left Atrium: Left atrial size was normal in size. Right Atrium: Right atrial size was normal in size. Pericardium: Trivial pericardial effusion is present. Mitral Valve: The mitral valve is grossly normal. No evidence of mitral valve regurgitation. Tricuspid Valve: The tricuspid valve is normal in structure. Tricuspid valve regurgitation is not demonstrated. Aortic Valve: The aortic valve is tricuspid. There is mild calcification of the aortic valve. Aortic valve regurgitation is not visualized. Pulmonic Valve: The pulmonic valve was not well visualized. Pulmonic valve regurgitation is not visualized. Aorta: The aortic root is normal in size and structure. Venous: The pulmonary veins were not well visualized. The inferior vena cava is normal in size with greater than 50% respiratory variability, suggesting right atrial pressure of 3 mmHg. IAS/Shunts: The atrial septum is grossly normal.  LEFT VENTRICLE PLAX 2D LVIDd:         3.60 cm  Diastology LVIDs:         2.60 cm  LV e' medial:    10.80 cm/s LV PW:         1.10 cm  LV E/e' medial:  10.9 LV IVS:        1.10 cm  LV e' lateral:   18.30 cm/s LVOT diam:     2.10 cm  LV E/e' lateral: 6.4 LV SV:         47 LV SV Index:   23 LVOT Area:     3.46 cm  3D Volume EF                         LV 3D EF:    71.20 %                         LV 3D EDV:   85200.00 mm                         LV 3D ESV:   24600.00 mm                         LV 3D SV:    60700.00 mm RIGHT VENTRICLE RV S prime:     16.80 cm/s TAPSE (M-mode): 1.4 cm LEFT ATRIUM             Index       RIGHT ATRIUM           Index LA diam:        3.30 cm 1.63 cm/m  RA Area:     11.80 cm LA Vol (A2C):   32.2 ml 15.90 ml/m  RA Volume:   21.90 ml  10.81 ml/m LA Vol (A4C):   32.8 ml 16.19 ml/m LA Biplane Vol: 34.0 ml 16.78 ml/m  AORTIC VALVE LVOT Vmax:   93.50 cm/s LVOT Vmean:  66.500 cm/s LVOT VTI:    0.136 m  AORTA Ao Root diam: 2.80 cm MITRAL VALVE MV Area (PHT): 4.80 cm     SHUNTS MV Decel Time: 158 msec     Systemic VTI:  0.14 m MV E velocity: 118.00 cm/s  Systemic Diam: 2.10 cm MV A velocity: 46.30 cm/s MV E/A ratio:  2.55 Rudean Haskell MD Electronically signed by Rudean Haskell MD Signature Date/Time: 03/12/2020/3:52:17 PM    Final     Scheduled Meds: . (feeding supplement) PROSource Plus  30 mL Oral Daily  . Chlorhexidine Gluconate Cloth  6 each Topical Daily  . donepezil  10 mg Oral QHS  . feeding supplement (GLUCERNA SHAKE)  237 mL Oral BID BM  . fluticasone  2 spray Each Nare Daily  . insulin aspart  0-20 Units Subcutaneous TID WC  . insulin aspart  0-5 Units Subcutaneous QHS  . insulin aspart  10 Units Subcutaneous TID WC  . insulin glargine  10 Units Subcutaneous Daily  . memantine  10 mg Oral BID  . metoprolol tartrate  25 mg Oral BID  . montelukast  10 mg Oral QHS  . pantoprazole  20 mg Oral BID  . simvastatin  40 mg Oral QHS  . sucralfate  1 g Oral TID AC & HS  . venlafaxine XR  150 mg Oral QHS  . venlafaxine XR  75 mg Oral Q breakfast   Continuous Infusions: . diltiazem (CARDIZEM) infusion 5 mg/hr (03/12/20 2134)     LOS: 1 day   Time spent: 35 minutes.  Little Ishikawa, DO [facility] [email or web address] 03/13/2020, 7:49 AM

## 2020-03-13 NOTE — Progress Notes (Signed)
Inpatient Diabetes Program Recommendations  AACE/ADA: New Consensus Statement on Inpatient Glycemic Control (2015)  Target Ranges:  Prepandial:   less than 140 mg/dL      Peak postprandial:   less than 180 mg/dL (1-2 hours)      Critically ill patients:  140 - 180 mg/dL   Lab Results  Component Value Date   GLUCAP 283 (H) 03/13/2020   HGBA1C 8.7 (H) 03/11/2020    Review of Glycemic Control  Diabetes history: DM2 Outpatient Diabetes medications: U-500 50 units tid Current orders for Inpatient glycemic control: Lantus 10 units QD, Novolog 0-20 units tidwc and hs + 10 units tidwc  HgbA1C - 8.7%  Inpatient Diabetes Program Recommendations:     D/C Lantus  D/C Novolog 10 units tidwc Start U-500 20 units tidwc (starting at 1700)  Will secure text MD with recs.   Thank you. Lorenda Peck, RD, LDN, CDE Inpatient Diabetes Coordinator [phone]

## 2020-03-13 NOTE — Progress Notes (Addendum)
Paged hospitalist for Patient's critical lab value for WBC -1.4   Paged hosp @ [phone], J. Pt requesting Imodium for diarrhea, thanks Abby

## 2020-03-14 ENCOUNTER — Ambulatory Visit: Payer: Self-pay | Admitting: *Deleted

## 2020-03-14 ENCOUNTER — Ambulatory Visit: Payer: Medicare Other | Admitting: Physician Assistant

## 2020-03-14 DIAGNOSIS — I4892 Unspecified atrial flutter: Principal | ICD-10-CM

## 2020-03-14 LAB — BASIC METABOLIC PANEL
Anion gap: 10 (ref 5–15)
BUN: 22 mg/dL (ref 8–23)
CO2: 25 mmol/L (ref 22–32)
Calcium: 8 mg/dL — ABNORMAL LOW (ref 8.9–10.3)
Chloride: 95 mmol/L — ABNORMAL LOW (ref 98–111)
Creatinine, Ser: 1.21 mg/dL (ref 0.61–1.24)
GFR, Estimated: >60 mL/min (ref >60)
Glucose, Bld: 251 mg/dL — ABNORMAL HIGH (ref 70–99)
Potassium: 4.4 mmol/L (ref 3.5–5.1)
Sodium: 130 mmol/L — ABNORMAL LOW (ref 135–145)

## 2020-03-14 LAB — CBC
HCT: 24.4 % — ABNORMAL LOW (ref 39.0–52.0)
Hemoglobin: 8.1 g/dL — ABNORMAL LOW (ref 13.0–17.0)
MCH: 30.3 pg (ref 26.0–34.0)
MCHC: 33.2 g/dL (ref 30.0–36.0)
MCV: 91.4 fL (ref 80.0–100.0)
Platelets: 25 10*3/uL — CL (ref 150–400)
RBC: 2.67 MIL/uL — ABNORMAL LOW (ref 4.22–5.81)
RDW: 14.3 % (ref 11.5–15.5)
WBC: 1.6 10*3/uL — ABNORMAL LOW (ref 4.0–10.5)
nRBC: 0 % (ref 0.0–0.2)

## 2020-03-14 LAB — GLUCOSE, CAPILLARY
Glucose-Capillary: 259 mg/dL — ABNORMAL HIGH (ref 70–99)
Glucose-Capillary: 191 mg/dL — ABNORMAL HIGH (ref 70–99)
Glucose-Capillary: 138 mg/dL — ABNORMAL HIGH (ref 70–99)
Glucose-Capillary: 54 mg/dL — ABNORMAL LOW (ref 70–99)
Glucose-Capillary: 211 mg/dL — ABNORMAL HIGH (ref 70–99)

## 2020-03-14 MED ORDER — METOPROLOL SUCCINATE ER 50 MG PO TB24
75.0000 mg | ORAL_TABLET | Freq: Every day | ORAL | Status: DC
  Administered 2020-03-15: 75 mg via ORAL
  Filled 2020-03-14: qty 1

## 2020-03-14 MED ORDER — MAGNESIUM SULFATE 4 GM/100ML IV SOLN
4.0000 g | Freq: Once | INTRAVENOUS | Status: AC
  Administered 2020-03-14: 4 g via INTRAVENOUS
  Filled 2020-03-14: qty 100

## 2020-03-14 MED ORDER — INSULIN ASPART 100 UNIT/ML ~~LOC~~ SOLN
3.0000 [IU] | Freq: Three times a day (TID) | SUBCUTANEOUS | Status: DC
  Administered 2020-03-15 (×2): 3 [IU] via SUBCUTANEOUS

## 2020-03-14 MED ORDER — METOPROLOL SUCCINATE ER 25 MG PO TB24
25.0000 mg | ORAL_TABLET | Freq: Once | ORAL | Status: AC
  Administered 2020-03-14: 25 mg via ORAL
  Filled 2020-03-14: qty 1

## 2020-03-14 NOTE — TOC Progression Note (Signed)
Transition of Care [facility]) - Progression Note    Patient Details  Name: KERIN CECCHI [name]: [id number] Date of Birth: 19-Dec-1945  [facility] CM/SW Contact  Purcell Mouton, RN Phone Number: 03/14/2020, 2:54 PM  Clinical Narrative:     Pt from home with 74 yo mother with Alzheimer and grandson. At present time there are no Home Health needs.   Expected Discharge Plan: Home/Self Care Barriers to Discharge: No Barriers Identified  Expected Discharge Plan and Services Expected Discharge Plan: Home/Self Care   Discharge Planning Services: CM Consult   Living arrangements for the past 2 months: Single Family Home                                       Social Determinants of Health (SDOH) Interventions    Readmission Risk Interventions No flowsheet data found.

## 2020-03-14 NOTE — Progress Notes (Signed)
PROGRESS NOTE    [name]  SWN:[id number] DOB: [birth year] DOA: 03/11/2020 PCP: [name], MD   Chief Complaint  Patient presents with  . Hyperglycemia  . Abnormal Lab   Brief Narrative: 74 year old male with history of SCLC on chemo, T2DM, hypertension, stage II CKD, anemia requiring transfusion sent to the ED by his oncologist after lab work showed hyperglycemia anemia. On arrival in the ED had a flutter heart rate 149 pancytopenia 1 unit PRBC was ordered placed on metoprolol diltiazem infusion and hospitalist called to admit the patient.  Subjective: He was resting comfortably.  Denies any chest pain palpitation or shortness of breath. HR uncontrolled in 130s. Patient denies any nausea, vomiting, chest pain, shortness of breath, fever, chills, headache, focal weakness, numbness tingling, speech difficulties,  Assessment & Plan:  New onset atrial flutter: Heart rate poorly controlled at 130s. Discussed with cardiology-changing Toprol to 75mg  daily.appreciate cardiology input.  Unable to pursue TEE/DCCV given limitations of anticoagulation at this time on dual platelet more than 50,000 and deferring anticoagulation to start once platelet stable and will be evaluated on outpatient basis by cardiology in 7 to 10 days after discharge.  His echo showed EF 65 to 70%, TSH normal with free T4 slightly 1.3-advised follow-up TFT in 4.   Symptomatic anemia thrombocytopenia, neutropenia/pancytopenia secondary to recent chemo has completed his last session.  Status post PRBC transfusion.  Hemoglobin is stable at 8.1 today.  Monitor. Recent Labs  Lab 03/12/20 0608 03/12/20 0608 03/12/20 2135 03/13/20 0445 03/14/20 0443  HGB 7.2*   < > 7.9* 7.9* 8.1*  HCT 20.8*   < > 22.6* 23.5* 24.4*  WBC 1.9*  --   --  1.4* 1.6*  PLT 49*  --   --  34* 25*   < > = values in this interval not displayed.   Small cell lung cancer s/ pXRT and  Completed last chemoi recently.  Followed with Dr.  [name].  Nonobstructive CAD: on statin.  Holding aspirin due to anemia and thrombocytopenia.  Hypertension:blood pressure stable continue Toprol  HLD continue statin LDL 82 in September, goal less than 70, 2/2 CAD.  T2DM brittle blood sugar poorly controlled in 500 on admission.  Hemoglobin A1c uncontrolled at 8.1. On Lantus 10 units daily, 10 units number Premeal TID and sliding scale.  DM coordinator following Recent Labs  Lab 03/13/20 1714 03/13/20 1836 03/13/20 2046 03/14/20 0735 03/14/20 1141  GLUCAP 64* 127* 182* 259* 191*   Small cell lung cancer s/ pXRT and  Completed last chemoi recently.  Followed with Dr. [name].   resolved.  Nutrition: Diet Order            Diet Carb Modified Fluid consistency: Thin; Room service appropriate? Yes  Diet effective now                 Nutrition Problem: Increased nutrient needs Etiology: acute illness, chronic illness, cancer and cancer related treatments Signs/Symptoms: estimated needs Interventions: Glucerna shake, Prostat  Body mass index is 25.38 kg/m.  DVT prophylaxis: SCDs Start: 03/11/20 2140 Code Status:   Code Status: Full Code  Family Communication: plan of care discussed with patient at bedside.  Status is: Inpatient Remains inpatient appropriate because:Inpatient level of care appropriate due to severity of illness and uncontrolled a fib  Dispo: The patient is from: Home w/ grandson              Anticipated d/c is to: Home request PT OT evaluation  Anticipated d/c date is: 2 days              Patient currently is not medically stable to d/c.  Consultants:see note  Procedures:see note  Culture/Microbiology    Component Value Date/Time   SDES ABSCESS LEFT FOOT 11/23/2019 1800   SPECREQUEST NONE 11/23/2019 1800   CULT  11/23/2019 1800    No growth aerobically or anaerobically. Performed at [facility], [place] [address] [place],  [address]    REPTSTATUS 11/28/2019 FINAL  11/23/2019 1800    Other culture-see note  Medications: Scheduled Meds: . (feeding supplement) PROSource Plus  30 mL Oral Daily  . Chlorhexidine Gluconate Cloth  6 each Topical Daily  . donepezil  10 mg Oral QHS  . feeding supplement (GLUCERNA SHAKE)  237 mL Oral BID BM  . fluticasone  2 spray Each Nare Daily  . insulin aspart  0-20 Units Subcutaneous TID WC  . insulin aspart  0-5 Units Subcutaneous QHS  . insulin aspart  10 Units Subcutaneous TID WC  . insulin glargine  10 Units Subcutaneous Daily  . memantine  10 mg Oral BID  . metoprolol succinate  50 mg Oral Daily  . montelukast  10 mg Oral QHS  . pantoprazole  20 mg Oral BID  . simvastatin  40 mg Oral QHS  . sucralfate  1 g Oral TID AC & HS  . venlafaxine XR  150 mg Oral QHS  . venlafaxine XR  75 mg Oral Q breakfast   Continuous Infusions:  Antimicrobials: Anti-infectives (From admission, onward)   None     Objective: Vitals: Today's Vitals   03/13/20 1045 03/13/20 1427 03/13/20 2047 03/14/20 0526  BP: 127/68 123/68 (!) 152/78 132/75  Pulse: 83 83 82 85  Resp: 17 (!) 22 20   Temp: 98.4 F (36.9 C) 98 F (36.7 C) 98.2 F (36.8 C) 98.2 F (36.8 C)  TempSrc: Oral Oral Oral Oral  SpO2: 98% 100% 96% 95%  Weight:      Height:      PainSc: 0-No pain  0-No pain     Intake/Output Summary (Last 24 hours) at 03/14/2020 0836 Last data filed at 03/13/2020 1833 Gross per 24 hour  Intake 1669.78 ml  Output --  Net 1669.78 ml   Filed Weights   03/11/20 1216  Weight: 82.6 kg   Weight change:   Intake/Output from previous day: 12/02 0701 - 12/03 0700 In: 1669.8 [P.O.:1560; I.V.:109.8] Out: -  Intake/Output this shift: No intake/output data recorded.  Examination: General exam: AAOx3,NAD, bald, weak appearing. HEENT:Oral mucosa moist, Ear/Nose WNL grossly,dentition normal. Respiratory system: bilaterally clear,no wheezing or crackles,no use of accessory muscle, non tender. Cardiovascular system: S1 & S2  +,irregular, No JVD. Gastrointestinal system: Abdomen soft, NT,ND, BS+. Nervous System:Alert, awake, moving extremities and grossly nonfocal Extremities: No edema, distal peripheral pulses palpable.  Skin: No rashes,no icterus. MSK: Normal muscle bulk,tone, power  Data Reviewed: I have personally reviewed following labs and imaging studies CBC: Recent Labs  Lab 03/11/20 1004 03/11/20 1004 03/11/20 1242 03/12/20 0608 03/12/20 2135 03/13/20 0445 03/14/20 0443  WBC 2.8*  --  3.3* 1.9*  --  1.4* 1.6*  NEUTROABS 2.4  --   --   --   --   --   --   HGB 6.7*  --  7.2* 7.2* 7.9* 7.9* 8.1*  HCT 19.9*   < > 21.8* 20.8* 22.6* 23.5* 24.4*  MCV 88.4  --  90.1 88.9  --  90.4 91.4  PLT 55*  --  65* 49*  --  34* 25*   < > = values in this interval not displayed.   Basic Metabolic Panel: Recent Labs  Lab 03/11/20 1242 03/11/20 2158 03/11/20 2205 03/12/20 0608 03/13/20 0445 03/14/20 0443  NA 125* 131*  --  129* 130* 130*  K 3.3* 3.6  --  4.1 4.2 4.4  CL 91* 94*  --  94* 96* 95*  CO2 25 26  --  26 25 25   GLUCOSE 522* 139*  --  272* 284* 251*  BUN 19 16  --  16 23 22   CREATININE 1.40* 1.04  --  1.21 1.37* 1.21  CALCIUM 7.7* 7.6*  --  7.6* 7.9* 8.0*  MG  --   --  1.2*  --  1.5*  --    GFR: Estimated Creatinine Clearance: 57 mL/min (by C-G formula based on SCr of 1.21 mg/dL). Liver Function Tests: Recent Labs  Lab 03/11/20 1004  AST <6*  ALT 7  ALKPHOS 100  BILITOT 0.7  PROT 5.8*  ALBUMIN 2.1*   No results for input(s): LIPASE, AMYLASE in the last 168 hours. No results for input(s): AMMONIA in the last 168 hours. Coagulation Profile: No results for input(s): INR, PROTIME in the last 168 hours. Cardiac Enzymes: No results for input(s): CKTOTAL, CKMB, CKMBINDEX, TROPONINI in the last 168 hours. BNP (last 3 results) No results for input(s): PROBNP in the last 8760 hours. HbA1C: Recent Labs    03/11/20 2153  HGBA1C 8.7*   CBG: Recent Labs  Lab 03/13/20 1631  03/13/20 1714 03/13/20 1836 03/13/20 2046 03/14/20 0735  GLUCAP 73 64* 127* 182* 259*   Lipid Profile: No results for input(s): CHOL, HDL, LDLCALC, TRIG, CHOLHDL, LDLDIRECT in the last 72 hours. Thyroid Function Tests: Recent Labs    03/11/20 2140  TSH 1.077  FREET4 1.35*   Anemia Panel: No results for input(s): VITAMINB12, FOLATE, FERRITIN, TIBC, IRON, RETICCTPCT in the last 72 hours. Sepsis Labs: No results for input(s): PROCALCITON, LATICACIDVEN in the last 168 hours.  Recent Results (from the past 240 hour(s))  Resp Panel by RT-PCR (Flu A&B, Covid) Nasopharyngeal Swab     Status: None   Collection Time: 03/11/20  1:04 PM   Specimen: Nasopharyngeal Swab; Nasopharyngeal(NP) swabs in vial transport medium  Result Value Ref Range Status   SARS Coronavirus 2 by RT PCR NEGATIVE NEGATIVE Final    Comment: (NOTE) SARS-CoV-2 target nucleic acids are NOT DETECTED.  The SARS-CoV-2 RNA is generally detectable in upper respiratory specimens during the acute phase of infection. The lowest concentration of SARS-CoV-2 viral copies this assay can detect is 138 copies/mL. A negative result does not preclude SARS-Cov-2 infection and should not be used as the sole basis for treatment or other patient management decisions. A negative result may occur with  improper specimen collection/handling, submission of specimen other than nasopharyngeal swab, presence of viral mutation(s) within the areas targeted by this assay, and inadequate number of viral copies(<138 copies/mL). A negative result must be combined with clinical observations, patient history, and epidemiological information. The expected result is Negative.  Fact Sheet for Patients:  EntrepreneurPulse.com.au  Fact Sheet for Healthcare Providers:  IncredibleEmployment.be  This test is no t yet approved or cleared by the [place] FDA and  has been authorized for detection and/or diagnosis  of SARS-CoV-2 by FDA under an Emergency Use Authorization (EUA). This EUA will remain  in effect (meaning this test can be used) for  the duration of the COVID-19 declaration under Section 564(b)(1) of the Act, 21 U.S.C.section 360bbb-3(b)(1), unless the authorization is terminated  or revoked sooner.       Influenza A by PCR NEGATIVE NEGATIVE Final   Influenza B by PCR NEGATIVE NEGATIVE Final    Comment: (NOTE) The Xpert Xpress SARS-CoV-2/FLU/RSV plus assay is intended as an aid in the diagnosis of influenza from Nasopharyngeal swab specimens and should not be used as a sole basis for treatment. Nasal washings and aspirates are unacceptable for Xpert Xpress SARS-CoV-2/FLU/RSV testing.  Fact Sheet for Patients: EntrepreneurPulse.com.au  Fact Sheet for Healthcare Providers: IncredibleEmployment.be  This test is not yet approved or cleared by the [place] FDA and has been authorized for detection and/or diagnosis of SARS-CoV-2 by FDA under an Emergency Use Authorization (EUA). This EUA will remain in effect (meaning this test can be used) for the duration of the COVID-19 declaration under Section 564(b)(1) of the Act, 21 U.S.C. section 360bbb-3(b)(1), unless the authorization is terminated or revoked.  Performed at [facility], [place] [address], [place], [place] [address]      Radiology Studies: ECHOCARDIOGRAM COMPLETE  Result Date: 03/12/2020    ECHOCARDIOGRAM REPORT   Patient Name:   [name] Date of Exam: 03/12/2020 Medical Rec #:  [id number]       Height:       71.0 in Accession #:    [id number]      Weight:       182.0 lb Date of Birth:  03-28-1946       BSA:          2.026 m Patient Age:    43 years        BP:           127/71 mmHg Patient Gender: M               HR:           98 bpm. Exam Location:  Inpatient Procedure: 2D Echo, 3D Echo, Color Doppler, Cardiac Doppler and Intracardiac            Opacification  Agent Indications:    Atrial Flutter 427.32 / I48.92  History:        Patient has prior history of Echocardiogram examinations, most                 recent 12/24/2014. Risk Factors:Hypertension, Diabetes and                 Dyslipidemia. Small cell carcinoma of right lung. Chronic kidney                 disease, stage III.  Sonographer:    Darlina Sicilian RDCS Referring Phys: [phone] JARED M GARDNER  Sonographer Comments: No parasternal window and suboptimal apical window. IMPRESSIONS  1. Left ventricular ejection fraction, by estimation, is 65 to 70%. The left ventricle has hyperdynamic function. The left ventricle has no regional wall motion abnormalities. There is mild concentric left ventricular hypertrophy. Left ventricular diastolic parameters are indeterminate.  2. Right ventricular systolic function is normal. The right ventricular size is normal. Mildly increased right ventricular wall thickness.  3. The mitral valve is grossly normal. No evidence of mitral valve regurgitation.  4. The aortic valve is tricuspid. There is mild calcification of the aortic valve. Aortic valve regurgitation is not visualized.  5. The inferior vena cava is normal in size with greater than 50% respiratory variability, suggesting right atrial pressure  of 3 mmHg. Comparison(s): A prior study was performed on 12/24/2014. No significant change from prior study. FINDINGS  Left Ventricle: Left ventricular ejection fraction, by estimation, is 65 to 70%. The left ventricle has hyperdynamic function. The left ventricle has no regional wall motion abnormalities. Definity contrast agent was given IV to delineate the left ventricular endocardial borders. The left ventricular internal cavity size was small. There is mild concentric left ventricular hypertrophy. Left ventricular diastolic parameters are indeterminate. Right Ventricle: The right ventricular size is normal. Mildly increased right ventricular wall thickness. Right ventricular systolic  function is normal. Left Atrium: Left atrial size was normal in size. Right Atrium: Right atrial size was normal in size. Pericardium: Trivial pericardial effusion is present. Mitral Valve: The mitral valve is grossly normal. No evidence of mitral valve regurgitation. Tricuspid Valve: The tricuspid valve is normal in structure. Tricuspid valve regurgitation is not demonstrated. Aortic Valve: The aortic valve is tricuspid. There is mild calcification of the aortic valve. Aortic valve regurgitation is not visualized. Pulmonic Valve: The pulmonic valve was not well visualized. Pulmonic valve regurgitation is not visualized. Aorta: The aortic root is normal in size and structure. Venous: The pulmonary veins were not well visualized. The inferior vena cava is normal in size with greater than 50% respiratory variability, suggesting right atrial pressure of 3 mmHg. IAS/Shunts: The atrial septum is grossly normal.  LEFT VENTRICLE PLAX 2D LVIDd:         3.60 cm  Diastology LVIDs:         2.60 cm  LV e' medial:    10.80 cm/s LV PW:         1.10 cm  LV E/e' medial:  10.9 LV IVS:        1.10 cm  LV e' lateral:   18.30 cm/s LVOT diam:     2.10 cm  LV E/e' lateral: 6.4 LV SV:         47 LV SV Index:   23 LVOT Area:     3.46 cm                         3D Volume EF                         LV 3D EF:    71.20 %                         LV 3D EDV:   85200.00 mm                         LV 3D ESV:   24600.00 mm                         LV 3D SV:    60700.00 mm RIGHT VENTRICLE RV S prime:     16.80 cm/s TAPSE (M-mode): 1.4 cm LEFT ATRIUM             Index       RIGHT ATRIUM           Index LA diam:        3.30 cm 1.63 cm/m  RA Area:     11.80 cm LA Vol (A2C):   32.2 ml 15.90 ml/m RA Volume:   21.90 ml  10.81 ml/m LA Vol (A4C):   32.8 ml 16.19 ml/m  LA Biplane Vol: 34.0 ml 16.78 ml/m  AORTIC VALVE LVOT Vmax:   93.50 cm/s LVOT Vmean:  66.500 cm/s LVOT VTI:    0.136 m  AORTA Ao Root diam: 2.80 cm MITRAL VALVE MV Area (PHT): 4.80 cm      SHUNTS MV Decel Time: 158 msec     Systemic VTI:  0.14 m MV E velocity: 118.00 cm/s  Systemic Diam: 2.10 cm MV A velocity: 46.30 cm/s MV E/A ratio:  2.55 Rudean Haskell MD Electronically signed by Rudean Haskell MD Signature Date/Time: 03/12/2020/3:52:17 PM    Final      LOS: 2 days   Antonieta Pert, MD [facility]  03/14/2020, 8:36 AM

## 2020-03-14 NOTE — Progress Notes (Signed)
Progress Note  Patient Name: [name] Date of Encounter: 03/14/2020  Primary Cardiologist: New to [facility]-Dr. Radford Pax, MD   Subjective   Signed off yesterday but asked to see back again today due to increased HR on tele in atrial flutter.  Denies any chest pain or SOB.   Inpatient Medications    Scheduled Meds: . (feeding supplement) PROSource Plus  30 mL Oral Daily  . Chlorhexidine Gluconate Cloth  6 each Topical Daily  . donepezil  10 mg Oral QHS  . feeding supplement (GLUCERNA SHAKE)  237 mL Oral BID BM  . fluticasone  2 spray Each Nare Daily  . insulin aspart  0-20 Units Subcutaneous TID WC  . insulin aspart  0-5 Units Subcutaneous QHS  . insulin aspart  10 Units Subcutaneous TID WC  . insulin glargine  10 Units Subcutaneous Daily  . memantine  10 mg Oral BID  . metoprolol succinate  50 mg Oral Daily  . montelukast  10 mg Oral QHS  . pantoprazole  20 mg Oral BID  . simvastatin  40 mg Oral QHS  . sucralfate  1 g Oral TID AC & HS  . venlafaxine XR  150 mg Oral QHS  . venlafaxine XR  75 mg Oral Q breakfast   Continuous Infusions:  PRN Meds: acetaminophen, albuterol, loperamide, ondansetron (ZOFRAN) IV, prochlorperazine, sodium chloride flush   Vital Signs    Vitals:   03/13/20 1045 03/13/20 1427 03/13/20 2047 03/14/20 0526  BP: 127/68 123/68 (!) 152/78 132/75  Pulse: 83 83 82 85  Resp: 17 (!) 22 20   Temp: 98.4 F (36.9 C) 98 F (36.7 C) 98.2 F (36.8 C) 98.2 F (36.8 C)  TempSrc: Oral Oral Oral Oral  SpO2: 98% 100% 96% 95%  Weight:      Height:        Intake/Output Summary (Last 24 hours) at 03/14/2020 0948 Last data filed at 03/13/2020 1833 Gross per 24 hour  Intake 1429.78 ml  Output --  Net 1429.78 ml   Filed Weights   03/11/20 1216  Weight: 82.6 kg    Physical Exam   GEN: Well nourished, well developed in no acute distress HEENT: Normal NECK: No JVD; No carotid bruits LYMPHATICS: No lymphadenopathy CARDIAC:irregular and tachy, no  murmurs, rubs, gallops RESPIRATORY:  Clear to auscultation without rales, wheezing or rhonchi  ABDOMEN: Soft, non-tender, non-distended MUSCULOSKELETAL:  No edema; No deformity  SKIN: Warm and dry NEUROLOGIC:  Alert and oriented x 3 PSYCHIATRIC:  Normal affect    Labs    Chemistry Recent Labs  Lab 03/11/20 1004 03/11/20 1242 03/12/20 0608 03/13/20 0445 03/14/20 0443  NA 129*   < > 129* 130* 130*  K 3.8   < > 4.1 4.2 4.4  CL 93*   < > 94* 96* 95*  CO2 24   < > 26 25 25   GLUCOSE 502*   < > 272* 284* 251*  BUN 17   < > 16 23 22   CREATININE 1.30*   < > 1.21 1.37* 1.21  CALCIUM 8.3*   < > 7.6* 7.9* 8.0*  PROT 5.8*  --   --   --   --   ALBUMIN 2.1*  --   --   --   --   AST <6*  --   --   --   --   ALT 7  --   --   --   --   ALKPHOS 100  --   --   --   --  BILITOT 0.7  --   --   --   --   GFRNONAA 58*   < > >60 54* >60  ANIONGAP 12   < > 9 9 10    < > = values in this interval not displayed.     Hematology Recent Labs  Lab 03/12/20 [phone] 03/12/20 0608 03/12/20 2135 03/13/20 0445 03/14/20 0443  WBC 1.9*  --   --  1.4* 1.6*  RBC 2.34*  --   --  2.60* 2.67*  HGB 7.2*   < > 7.9* 7.9* 8.1*  HCT 20.8*   < > 22.6* 23.5* 24.4*  MCV 88.9  --   --  90.4 91.4  MCH 30.8  --   --  30.4 30.3  MCHC 34.6  --   --  33.6 33.2  RDW 14.5  --   --  14.2 14.3  PLT 49*  --   --  34* 25*   < > = values in this interval not displayed.    Cardiac EnzymesNo results for input(s): TROPONINI in the last 168 hours. No results for input(s): TROPIPOC in the last 168 hours.   BNPNo results for input(s): BNP, PROBNP in the last 168 hours.   DDimer No results for input(s): DDIMER in the last 168 hours.   Radiology    No results found. Telemetry    AFlutter with rates in the 120's- Personally Reviewed  ECG     No new tracing as of 03/13/20- Personally Reviewed  Cardiac Studies   Echocardiogram  03/12/20:  1. Left ventricular ejection fraction, by estimation, is 65 to 70%. The  left  ventricle has hyperdynamic function. The left ventricle has no  regional wall motion abnormalities. There is mild concentric left  ventricular hypertrophy. Left ventricular  diastolic parameters are indeterminate.  2. Right ventricular systolic function is normal. The right ventricular  size is normal. Mildly increased right ventricular wall thickness.  3. The mitral valve is grossly normal. No evidence of mitral valve  regurgitation.  4. The aortic valve is tricuspid. There is mild calcification of the  aortic valve. Aortic valve regurgitation is not visualized.  5. The inferior vena cava is normal in size with greater than 50%  respiratory variability, suggesting right atrial pressure of 3 mmHg.   Comparison(s): A prior study was performed on 12/24/2014. No significant  change from prior study.   Patient Profile     74 y.o. male with a PMH of non-obstructive CAD, HTN, brittle DM type 2, CKD stage 3, and SCLC s/p XRT and currently on chemotherapy c/b pancytopenia requiring blood transfusions, who is being seen today for the evaluation of new onset atrial flutter at the request of Dr. Bonner Puna.  Assessment & Plan    1. New onset atrial flutter:  -Pt presented after receiving blood transfusion for a Hb of 6.7 due to recent chemo and pancytopenia and was incidentally found to be in atrial flutter with RVR with rates up to 150s on EKG of unknown duration.  -Subsequently he was started on a diltiazem gtt and transitioned to lopressor -CHA2DS2-VASc Score = 4 -Unable to pursue TEE/DCCV given limitations of anticoagulation at this time>>will have him follow with OP AF Clinic in 7-10 days for re-assessment  -Oncology not recommending anticoagulation at this time until Plt count greater then 50,000>>>will defer AC start to [facility] once more stable with platelets (Plt Ct 25K today) -HR still at times elevated with ambulation -was on Lopressor 25mg  BID yesterday with plans  to change to Toprol XL 50mg   daily but since his HR is still elevated will increase Toprol XL to 75mg  daily  2. Symptomatic anemia:  -related to pancytopenia from recent chemo (has completed last chemo session) -Hgb 6.7 in the OP setting>>>recieved PRBC infusion>>Hb 8.1 today  -Continue management per primary team  3. Non-obstructive CAD:  -LHC in 2013 showed a 20% RCA stenosis.  -No recent anginal complaints. EKG without overt ischemic changes.  -Would continue to hold aspirin given ongoing anemia and need to start DOAC for aflutter -Continue statin  4. HTN:  -Stable, 137/62>>114/61>>>107/52>>123/68>>132/76mmHgmmHg -Continue management in the context of #1  5. HLD:  -LDL 82 12/2018; goal <70 given CAD history -Continue statin  6. DM type 2:  -BG up to 500s on admission and as low as 69. A1C 8.7; goal <7 -Continue management per primary team   I have spent a total of 35 minutes with patient reviewing telemetry, EKGs, labs and examining patient as well as establishing an assessment and plan that was discussed with the patient.  > 50% of time was spent in direct patient care.    [facility] will sign off.   Medication Recommendations:  Toprol XL 50mg  daily.  Start Eliquis once Plt Ct > 50K Other recommendations (labs, testing, etc):  none Follow up as an outpatient:  afib clinic 12/8  Signed: Fransico Him, MD [facility] 03/13/2020

## 2020-03-14 NOTE — Consult Note (Signed)
   [facility] Inpatient Consult   03/14/2020  [name] 09-01-1945 [id number]   Patient is currently active with [facility] Care Management [facility] CM) for chronic disease management services.  Patient has been engaged by a [place] care coordinator for chronic disease management services.  Plan: Will continue to follow for progression and disposition plans.  Of note, [facility] Care Management services does not replace or interfere with any services that are arranged by inpatient case management or social work.  Netta Cedars, MSN, [facility] Liaison Nurse Mobile Phone [phone]  Toll free office [phone]

## 2020-03-15 DIAGNOSIS — I4892 Unspecified atrial flutter: Secondary | ICD-10-CM | POA: Diagnosis not present

## 2020-03-15 LAB — CBC
HCT: 24.4 % — ABNORMAL LOW (ref 39.0–52.0)
Hemoglobin: 8.1 g/dL — ABNORMAL LOW (ref 13.0–17.0)
MCH: 30.2 pg (ref 26.0–34.0)
MCHC: 33.2 g/dL (ref 30.0–36.0)
MCV: 91 fL (ref 80.0–100.0)
Platelets: 24 10*3/uL — CL (ref 150–400)
RBC: 2.68 MIL/uL — ABNORMAL LOW (ref 4.22–5.81)
RDW: 14.1 % (ref 11.5–15.5)
WBC: 2.4 10*3/uL — ABNORMAL LOW (ref 4.0–10.5)
nRBC: 0 % (ref 0.0–0.2)

## 2020-03-15 LAB — BASIC METABOLIC PANEL
Anion gap: 9 (ref 5–15)
BUN: 17 mg/dL (ref 8–23)
CO2: 28 mmol/L (ref 22–32)
Calcium: 8 mg/dL — ABNORMAL LOW (ref 8.9–10.3)
Chloride: 92 mmol/L — ABNORMAL LOW (ref 98–111)
Creatinine, Ser: 1.26 mg/dL — ABNORMAL HIGH (ref 0.61–1.24)
GFR, Estimated: 60 mL/min — ABNORMAL LOW (ref >60)
Glucose, Bld: 259 mg/dL — ABNORMAL HIGH (ref 70–99)
Potassium: 4.2 mmol/L (ref 3.5–5.1)
Sodium: 129 mmol/L — ABNORMAL LOW (ref 135–145)

## 2020-03-15 LAB — GLUCOSE, CAPILLARY
Glucose-Capillary: 257 mg/dL — ABNORMAL HIGH (ref 70–99)
Glucose-Capillary: 246 mg/dL — ABNORMAL HIGH (ref 70–99)

## 2020-03-15 MED ORDER — METOPROLOL SUCCINATE ER 25 MG PO TB24: 75.0000 mg | ORAL_TABLET | Freq: Every day | ORAL | 0 refills | Status: DC

## 2020-03-15 NOTE — Discharge Summary (Signed)
Physician Discharge Summary  CHISTOPHER MANGINO VAN:[id number] DOB: [birth year] DOA: 03/11/2020  PCP: [name], MD  Admit date: 03/11/2020 Discharge date: 03/15/2020  Admitted From: home Disposition:  home  Recommendations for Outpatient Follow-up:  1. Follow up with PCP in 1-2 weeks 2. Please obtain BMP/CBC in one week 3. Please follow up on the following pending results:  Home Health:home  Equipment/Devices:Home  Discharge Condition: Stable Code Status:   Code Status: Full Code Diet recommendation:  Diet Order            Diet Carb Modified           Diet Carb Modified Fluid consistency: Thin; Room service appropriate? 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 On arrival in the ED had a flutter heart rate 149 pancytopenia 1 unit PRBC was ordered placed on metoprolol diltiazem infusion and hospitalist called to admit the patient. Patient was found to have new onset atrial flutter symptomatic anemia.  He was seen by cardiology given his chemo pancytopenia not a candidate for anticoagulation at this time, he has managed with medication heart rate has been stabilized with metoprolol dose increased.  He will need to follow-up closely for consideration of anticoagulation in the next 7 to 10 days and cardiology will arrange outpatient follow-up  Discharge Diagnoses:  New onset atrial flutter:  A flutter rate overall controlled. Seen by cardiology medication has been adjusted he will continue on Toprol 75 mg daily. Unable to pursue TEE/DCCV given limitations of anticoagulation at this time on dual platelet more than 50,000 and deferring anticoagulation to start once platelet stable and will be evaluated on outpatient basis by cardiology in 7 to 10 days after discharge.  His  echo showed EF 65 to 70%, TSH normal with free T4 slightly 1.3-advised follow-up TFT in 4.  Symptomatic anemia thrombocytopenia, neutropenia/pancytopenia secondary to recent chemo has completed his last session.    Hemoglobin improved 8.1 g, WC count improving 2400, platelet stable 24,000 status post PRBC transfusion.  Patient had received blood transfusion total 2 units- on 11/30 and 12/1.  Small cell lung cancer s/ pXRT and  Completed last chemoi recently.  Followed with Dr. [name].  Advise outpatient follow-up  Nonobstructive CAD:  Continue statin.  Aspirin on hold due to anemia and thrombocytopenia resume after following up with PCP or cardiology   Hypertension: Blood pressure controlled continue Toprol   HLD continue statin LDL 82 in September, goal less than 70, 2/2 CAD.  T2DM brittle blood sugar poorly controlled in 500 on admission.  Hemoglobin A1c uncontrolled at 8.1.  Blood sugar overall is stable, he will resume these insulin U-500 at home with close monitoring of his blood sugar before every meal at bedtime and follow-up with PCP/endocrinology  Consults:  Cardiology  Subjective: Alert awake oriented no palpitations shortness of breath.  Heart rate stable in 80s to low 100.  Would like to go home today.  Discharge Exam: Vitals:   03/15/20 0357 03/15/20 1141  BP: (!) 149/91 (!) 111/56  Pulse: 89 87  Resp:  18  Temp: 98.2 F (36.8 C) 98.2 F (36.8 C)  SpO2: 91% 95%   General: Pt is alert, awake, not in acute distress Cardiovascular: RRR, S1/S2 +, no  rubs, no gallops Respiratory: CTA bilaterally, no wheezing, no rhonchi Abdominal: Soft, NT, ND, bowel sounds + Extremities: no edema, no cyanosis  Discharge Instructions  Discharge Instructions    Diet Carb Modified   Complete by: As directed    Discharge instructions   Complete by: As directed    Check CBC/BMP from PCP or hematology.  Please follow-up with cardiology regarding her A. fib and anticoagulation  recommendation.  Please call call MD or return to ER for similar or worsening recurring problem that brought you to hospital or if any fever,nausea/vomiting,abdominal pain, uncontrolled pain, chest pain,  shortness of breath or any other alarming symptoms.  Please follow-up your doctor as instructed in a week time and call the office for appointment.  Please avoid alcohol, smoking, or any other illicit substance and maintain healthy habits including taking your regular medications as prescribed.  You were cared for by a hospitalist during your hospital stay. If you have any questions about your discharge medications or the care you received while you were in the hospital after you are discharged, you can call the unit and ask to speak with the hospitalist on call if the hospitalist that took care of you is not available.  Once you are discharged, your primary care physician will handle any further medical issues. Please note that NO REFILLS for any discharge medications will be authorized once you are discharged, as it is imperative that you return to your primary care physician (or establish a relationship with a primary care physician if you do not have one) for your aftercare needs so that they can reassess your need for medications and monitor your lab values   Increase activity slowly   Complete by: As directed      Allergies as of 03/15/2020      Reactions   Tetracycline Itching, Rash      Medication List    STOP taking these medications   aspirin EC 81 MG tablet     TAKE these medications   albuterol 108 (90 Base) MCG/ACT inhaler Commonly known as: VENTOLIN HFA Inhale 1-2 puffs into the lungs every 6 (six) hours as needed for wheezing or shortness of breath.   ampicillin 500 MG capsule Commonly known as: PRINCIPEN Take 500 mg by mouth daily as needed (flare up).   cholecalciferol 1000 units tablet Commonly known as: VITAMIN D Take 2,000 Units by mouth daily.   CLEAR EYES  COMPLETE OP Place 1 drop into both eyes daily as needed (dry eyes).   donepezil 10 MG tablet Commonly known as: ARICEPT TAKE 1 TABLET BY MOUTH AT BEDTIME   fluticasone 50 MCG/ACT nasal spray Commonly known as: FLONASE PLACE 2 SPRAYS IN EACH NOSTRIL DAILY What changed:   how much to take  how to take this  when to take this   HumuLIN R U-500 KwikPen 500 UNIT/ML kwikpen Generic drug: insulin regular human CONCENTRATED Inject 40 Units into the skin with breakfast, with lunch, and with evening meal.   memantine 10 MG tablet Commonly known as: NAMENDA TAKE 1 TABLET BY MOUTH TWICE A DAY   metoprolol succinate 25 MG 24 hr tablet Commonly known as: TOPROL-XL Take 3 tablets (75 mg total) by mouth daily for 30 doses. Start taking on: March 16, 2020   montelukast 10 MG tablet Commonly known as: SINGULAIR Take 1 tablet (10 mg total) by mouth at bedtime.   pantoprazole 20 MG tablet Commonly known as: Protonix Take 1 tablet (20 mg total) by mouth  daily. What changed: when to take this   Pen Needles 31G X 5 MM Misc 1 each by Does not apply route 3 (three) times daily. To inject insulin E11.42   prochlorperazine 10 MG tablet Commonly known as: COMPAZINE Take 1 tablet (10 mg total) by mouth every 6 (six) hours as needed. What changed: reasons to take this   QC Acetaminophen 8Hr Arth Pain 650 MG CR tablet Generic drug: acetaminophen Take 650 mg by mouth every 8 (eight) hours as needed for pain.   simvastatin 40 MG tablet Commonly known as: ZOCOR TAKE 1 TABLET BY MOUTH AT BEDTIME   sucralfate 1 g tablet Commonly known as: Carafate Take 1 tablet (1 g total) by mouth 4 (four) times daily. Dissolve each tablet in 15 cc water before use.   venlafaxine XR 75 MG 24 hr capsule Commonly known as: EFFEXOR-XR TAKE 1 CAPSULE BY MOUTH EVERY MORNING AND 2 CAPSULES AT BEDTIME What changed: See the new instructions.   vitamin C 1000 MG tablet Take 1,000 mg by mouth daily.        Follow-up Information    Schedule an appointment as soon as possible for a visit  with [name], MD.   Specialties: Family Medicine, Sports Medicine Contact information: [place] [address] [phone]        [facility] [place] Follow up on 03/19/2020.   Specialty: Cardiology Why: at 1030am  Contact information: [place] [address] [phone]              Allergies  Allergen Reactions  . Tetracycline Itching and Rash    The results of significant diagnostics from this hospitalization (including imaging, microbiology, ancillary and laboratory) are listed below for reference.    Microbiology: Recent Results (from the past 240 hour(s))  Resp Panel by RT-PCR (Flu A&B, Covid) Nasopharyngeal Swab     Status: None   Collection Time: 03/11/20  1:04 PM   Specimen: Nasopharyngeal Swab; Nasopharyngeal(NP) swabs in vial transport medium  Result Value Ref Range Status   SARS Coronavirus 2 by RT PCR NEGATIVE NEGATIVE Final    Comment: (NOTE) SARS-CoV-2 target nucleic acids are NOT DETECTED.  The SARS-CoV-2 RNA is generally detectable in upper respiratory specimens during the acute phase of infection. The lowest concentration of SARS-CoV-2 viral copies this assay can detect is 138 copies/mL. A negative result does not preclude SARS-Cov-2 infection and should not be used as the sole basis for treatment or other patient management decisions. A negative result may occur with  improper specimen collection/handling, submission of specimen other than nasopharyngeal swab, presence of viral mutation(s) within the areas targeted by this assay, and inadequate number of viral copies(<138 copies/mL). A negative result must be combined with clinical observations, patient history, and epidemiological information. The expected result is Negative.  Fact Sheet for Patients:  EntrepreneurPulse.com.au  Fact Sheet for  Healthcare Providers:  IncredibleEmployment.be  This test is no t yet approved or cleared by the [place] FDA and  has been authorized for detection and/or diagnosis of SARS-CoV-2 by FDA under an Emergency Use Authorization (EUA). This EUA will remain  in effect (meaning this test can be used) for the duration of the COVID-19 declaration under Section 564(b)(1) of the Act, 21 U.S.C.section 360bbb-3(b)(1), unless the authorization is terminated  or revoked sooner.       Influenza A by PCR NEGATIVE NEGATIVE Final   Influenza B by PCR NEGATIVE NEGATIVE Final  Comment: (NOTE) The Xpert Xpress SARS-CoV-2/FLU/RSV plus assay is intended as an aid in the diagnosis of influenza from Nasopharyngeal swab specimens and should not be used as a sole basis for treatment. Nasal washings and aspirates are unacceptable for Xpert Xpress SARS-CoV-2/FLU/RSV testing.  Fact Sheet for Patients: EntrepreneurPulse.com.au  Fact Sheet for Healthcare Providers: IncredibleEmployment.be  This test is not yet approved or cleared by the [place] FDA and has been authorized for detection and/or diagnosis of SARS-CoV-2 by FDA under an Emergency Use Authorization (EUA). This EUA will remain in effect (meaning this test can be used) for the duration of the COVID-19 declaration under Section 564(b)(1) of the Act, 21 U.S.C. section 360bbb-3(b)(1), unless the authorization is terminated or revoked.  Performed at [facility], [place] [address] [place], [place] [address]     Procedures/Studies: ECHOCARDIOGRAM COMPLETE  Result Date: 03/12/2020    ECHOCARDIOGRAM REPORT   Patient Name:   JAYVYN HASELTON Date of Exam: 03/12/2020 Medical Rec #:  [id number]       Height:       71.0 in Accession #:    [id number]      Weight:       182.0 lb Date of Birth:  November 07, 1945       BSA:          2.026 m Patient Age:    74 years        BP:            127/71 mmHg Patient Gender: M               HR:           98 bpm. Exam Location:  Inpatient Procedure: 2D Echo, 3D Echo, Color Doppler, Cardiac Doppler and Intracardiac            Opacification Agent Indications:    Atrial Flutter 427.32 / I48.92  History:        Patient has prior history of Echocardiogram examinations, most                 recent 12/24/2014. Risk Factors:Hypertension, Diabetes and                 Dyslipidemia. Small cell carcinoma of right lung. Chronic kidney                 disease, stage III.  Sonographer:    Darlina Sicilian RDCS Referring Phys: [phone] JARED M GARDNER  Sonographer Comments: No parasternal window and suboptimal apical window. IMPRESSIONS  1. Left ventricular ejection fraction, by estimation, is 65 to 70%. The left ventricle has hyperdynamic function. The left ventricle has no regional wall motion abnormalities. There is mild concentric left ventricular hypertrophy. Left ventricular diastolic parameters are indeterminate.  2. Right ventricular systolic function is normal. The right ventricular size is normal. Mildly increased right ventricular wall thickness.  3. The mitral valve is grossly normal. No evidence of mitral valve regurgitation.  4. The aortic valve is tricuspid. There is mild calcification of the aortic valve. Aortic valve regurgitation is not visualized.  5. The inferior vena cava is normal in size with greater than 50% respiratory variability, suggesting right atrial pressure of 3 mmHg. Comparison(s): A prior study was performed on 12/24/2014. No significant change from prior study. FINDINGS  Left Ventricle: Left ventricular ejection fraction, by estimation, is 65 to 70%. The left ventricle has hyperdynamic function. The left ventricle has no regional wall motion abnormalities. Definity contrast agent was given  IV to delineate the left ventricular endocardial borders. The left ventricular internal cavity size was small. There is mild concentric left ventricular  hypertrophy. Left ventricular diastolic parameters are indeterminate. Right Ventricle: The right ventricular size is normal. Mildly increased right ventricular wall thickness. Right ventricular systolic function is normal. Left Atrium: Left atrial size was normal in size. Right Atrium: Right atrial size was normal in size. Pericardium: Trivial pericardial effusion is present. Mitral Valve: The mitral valve is grossly normal. No evidence of mitral valve regurgitation. Tricuspid Valve: The tricuspid valve is normal in structure. Tricuspid valve regurgitation is not demonstrated. Aortic Valve: The aortic valve is tricuspid. There is mild calcification of the aortic valve. Aortic valve regurgitation is not visualized. Pulmonic Valve: The pulmonic valve was not well visualized. Pulmonic valve regurgitation is not visualized. Aorta: The aortic root is normal in size and structure. Venous: The pulmonary veins were not well visualized. The inferior vena cava is normal in size with greater than 50% respiratory variability, suggesting right atrial pressure of 3 mmHg. IAS/Shunts: The atrial septum is grossly normal.  LEFT VENTRICLE PLAX 2D LVIDd:         3.60 cm  Diastology LVIDs:         2.60 cm  LV e' medial:    10.80 cm/s LV PW:         1.10 cm  LV E/e' medial:  10.9 LV IVS:        1.10 cm  LV e' lateral:   18.30 cm/s LVOT diam:     2.10 cm  LV E/e' lateral: 6.4 LV SV:         47 LV SV Index:   23 LVOT Area:     3.46 cm                         3D Volume EF                         LV 3D EF:    71.20 %                         LV 3D EDV:   85200.00 mm                         LV 3D ESV:   24600.00 mm                         LV 3D SV:    60700.00 mm RIGHT VENTRICLE RV S prime:     16.80 cm/s TAPSE (M-mode): 1.4 cm LEFT ATRIUM             Index       RIGHT ATRIUM           Index LA diam:        3.30 cm 1.63 cm/m  RA Area:     11.80 cm LA Vol (A2C):   32.2 ml 15.90 ml/m RA Volume:   21.90 ml  10.81 ml/m LA Vol (A4C):    32.8 ml 16.19 ml/m LA Biplane Vol: 34.0 ml 16.78 ml/m  AORTIC VALVE LVOT Vmax:   93.50 cm/s LVOT Vmean:  66.500 cm/s LVOT VTI:    0.136 m  AORTA Ao Root diam: 2.80 cm MITRAL VALVE MV Area (PHT): 4.80 cm     SHUNTS MV Decel Time:  158 msec     Systemic VTI:  0.14 m MV E velocity: 118.00 cm/s  Systemic Diam: 2.10 cm MV A velocity: 46.30 cm/s MV E/A ratio:  2.55 Rudean Haskell MD Electronically signed by Rudean Haskell MD Signature Date/Time: 03/12/2020/3:52:17 PM    Final      Labs: BNP (last 3 results) No results for input(s): BNP in the last 8760 hours. Basic Metabolic Panel: Recent Labs  Lab 03/11/20 2158 03/11/20 2205 03/12/20 0608 03/13/20 0445 03/14/20 0443 03/15/20 0606  NA 131*  --  129* 130* 130* 129*  K 3.6  --  4.1 4.2 4.4 4.2  CL 94*  --  94* 96* 95* 92*  CO2 26  --  26 25 25 28   GLUCOSE 139*  --  272* 284* 251* 259*  BUN 16  --  16 23 22 17   CREATININE 1.04  --  1.21 1.37* 1.21 1.26*  CALCIUM 7.6*  --  7.6* 7.9* 8.0* 8.0*  MG  --  1.2*  --  1.5*  --   --    Liver Function Tests: Recent Labs  Lab 03/11/20 1004  AST <6*  ALT 7  ALKPHOS 100  BILITOT 0.7  PROT 5.8*  ALBUMIN 2.1*   No results for input(s): LIPASE, AMYLASE in the last 168 hours. No results for input(s): AMMONIA in the last 168 hours. CBC: Recent Labs  Lab 03/11/20 1004 03/11/20 1004 03/11/20 1242 03/11/20 1242 03/12/20 0608 03/12/20 2135 03/13/20 0445 03/14/20 0443 03/15/20 0606  WBC 2.8*  --  3.3*  --  1.9*  --  1.4* 1.6* 2.4*  NEUTROABS 2.4  --   --   --   --   --   --   --   --   HGB 6.7*  --  7.2*   < > 7.2* 7.9* 7.9* 8.1* 8.1*  HCT 19.9*   < > 21.8*   < > 20.8* 22.6* 23.5* 24.4* 24.4*  MCV 88.4   < > 90.1  --  88.9  --  90.4 91.4 91.0  PLT 55*  --  65*  --  49*  --  34* 25* 24*   < > = values in this interval not displayed.   Cardiac Enzymes: No results for input(s): CKTOTAL, CKMB, CKMBINDEX, TROPONINI in the last 168 hours. BNP: Invalid input(s):  POCBNP CBG: Recent Labs  Lab 03/14/20 1434 03/14/20 1724 03/14/20 2125 03/15/20 0729 03/15/20 1133  GLUCAP 138* 54* 211* 257* 246*   D-Dimer No results for input(s): DDIMER in the last 72 hours. Hgb A1c No results for input(s): HGBA1C in the last 72 hours. Lipid Profile No results for input(s): CHOL, HDL, LDLCALC, TRIG, CHOLHDL, LDLDIRECT in the last 72 hours. Thyroid function studies No results for input(s): TSH, T4TOTAL, T3FREE, THYROIDAB in the last 72 hours.  Invalid input(s): FREET3 Anemia work up No results for input(s): VITAMINB12, FOLATE, FERRITIN, TIBC, IRON, RETICCTPCT in the last 72 hours. Urinalysis    Component Value Date/Time   COLORURINE YELLOW 03/11/2020 1556   APPEARANCEUR HAZY (A) 03/11/2020 1556   LABSPEC 1.012 03/11/2020 1556   PHURINE 5.0 03/11/2020 1556   GLUCOSEU >=500 (A) 03/11/2020 1556   HGBUR MODERATE (A) 03/11/2020 1556   BILIRUBINUR NEGATIVE 03/11/2020 1556   BILIRUBINUR 1+ 09/10/2014 1615   KETONESUR NEGATIVE 03/11/2020 1556   PROTEINUR 100 (A) 03/11/2020 1556   UROBILINOGEN 0.2 12/05/2014 2146   NITRITE NEGATIVE 03/11/2020 1556   LEUKOCYTESUR NEGATIVE 03/11/2020 1556   Sepsis Labs Invalid input(s):  PROCALCITONIN,  WBC,  LACTICIDVEN Microbiology Recent Results (from the past 240 hour(s))  Resp Panel by RT-PCR (Flu A&B, Covid) Nasopharyngeal Swab     Status: None   Collection Time: 03/11/20  1:04 PM   Specimen: Nasopharyngeal Swab; Nasopharyngeal(NP) swabs in vial transport medium  Result Value Ref Range Status   SARS Coronavirus 2 by RT PCR NEGATIVE NEGATIVE Final    Comment: (NOTE) SARS-CoV-2 target nucleic acids are NOT DETECTED.  The SARS-CoV-2 RNA is generally detectable in upper respiratory specimens during the acute phase of infection. The lowest concentration of SARS-CoV-2 viral copies this assay can detect is 138 copies/mL. A negative result does not preclude SARS-Cov-2 infection and should not be used as the sole basis for  treatment or other patient management decisions. A negative result may occur with  improper specimen collection/handling, submission of specimen other than nasopharyngeal swab, presence of viral mutation(s) within the areas targeted by this assay, and inadequate number of viral copies(<138 copies/mL). A negative result must be combined with clinical observations, patient history, and epidemiological information. The expected result is Negative.  Fact Sheet for Patients:  EntrepreneurPulse.com.au  Fact Sheet for Healthcare Providers:  IncredibleEmployment.be  This test is no t yet approved or cleared by the [place] FDA and  has been authorized for detection and/or diagnosis of SARS-CoV-2 by FDA under an Emergency Use Authorization (EUA). This EUA will remain  in effect (meaning this test can be used) for the duration of the COVID-19 declaration under Section 564(b)(1) of the Act, 21 U.S.C.section 360bbb-3(b)(1), unless the authorization is terminated  or revoked sooner.       Influenza A by PCR NEGATIVE NEGATIVE Final   Influenza B by PCR NEGATIVE NEGATIVE Final    Comment: (NOTE) The Xpert Xpress SARS-CoV-2/FLU/RSV plus assay is intended as an aid in the diagnosis of influenza from Nasopharyngeal swab specimens and should not be used as a sole basis for treatment. Nasal washings and aspirates are unacceptable for Xpert Xpress SARS-CoV-2/FLU/RSV testing.  Fact Sheet for Patients: EntrepreneurPulse.com.au  Fact Sheet for Healthcare Providers: IncredibleEmployment.be  This test is not yet approved or cleared by the [place] FDA and has been authorized for detection and/or diagnosis of SARS-CoV-2 by FDA under an Emergency Use Authorization (EUA). This EUA will remain in effect (meaning this test can be used) for the duration of the COVID-19 declaration under Section 564(b)(1) of the Act, 21  U.S.C. section 360bbb-3(b)(1), unless the authorization is terminated or revoked.  Performed at [facility], [place] [address], [place], [place] [address]      Time coordinating discharge: 25 minutes  SIGNED: Antonieta Pert, MD  [facility] 03/15/2020, 12:49 PM  If 7PM-7AM, please contact night-coverage [email or web address]

## 2020-03-15 NOTE — Evaluation (Signed)
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   Clinical Impression   [name] is a 74 year old man admitted to hospital with anemia and atrial flutter. Patient normally independent from wheelchair level due to wounds on bilateral feet. Patient able to perform transfers as needed at home for ADLs and functional mobility. On evaluation patient demonstrates functional strength, ability to perform pivot transfers, and demonstrated ability to perform LB ADLs. Patient at baseline. Patient has all needed DME at home as well as family support. No OT needs at this time.    Follow Up Recommendations  No OT follow up    Equipment Recommendations  None recommended by OT    Recommendations for Other Services       Precautions / Restrictions Precautions Precautions: None Precaution Comments: Wounds on bilateral feet (reports predominantly non weight bearing for healing) Restrictions Weight Bearing Restrictions: No      Mobility Bed Mobility               General bed mobility comments: Sitting in recliner.    Transfers Overall transfer level: Modified independent               General transfer comment: Mod I for stand pivot and squat pivot as needed.    Balance Overall balance assessment: Mild deficits observed, not formally tested                                         ADL either performed or assessed with clinical judgement   ADL Overall ADL's : At baseline                                       General ADL Comments: Patient demonstrated ability to perform transfers for  functional mobility during ADLs and LB dressing.     Vision Patient Visual Report: No change from baseline       Perception     Praxis      Pertinent Vitals/Pain Pain Assessment: No/denies pain     Hand Dominance Right   Extremity/Trunk Assessment Upper Extremity Assessment Upper Extremity Assessment: Overall WFL for tasks assessed   Lower Extremity Assessment Lower Extremity Assessment: Defer to PT evaluation   Cervical / Trunk Assessment Cervical / Trunk Assessment: Normal   Communication Communication Communication: No difficulties   Cognition Arousal/Alertness: Awake/alert Behavior During Therapy: WFL for tasks assessed/performed                                       General Comments  Able to stand and maintain balance for donning LB clothing.    Exercises     Shoulder Instructions      Home Living Family/patient expects to be discharged to:: Private residence Living Arrangements: Other relatives (Mother and grandson) Available Help at Discharge: Available 24 hours/day;Family Type of Home: House Home Access: Ramped entrance  Home Layout: One level     Bathroom Shower/Tub: Occupational psychologist: Handicapped height Bathroom Accessibility: Yes How Accessible: Accessible via wheelchair Home Equipment: Shower seat - built in;Walker - 4 wheels          Prior Functioning/Environment Level of Independence: Independent        Comments: Driving himself to radiation; modified independent from wc level.        OT Problem List: Decreased strength;Decreased activity tolerance      OT Treatment/Interventions:      OT Goals(Current goals can be found in the care plan section) Acute Rehab OT Goals Patient Stated Goal: To return home OT Goal Formulation: All assessment and education complete, DC therapy  OT Frequency:     Barriers to D/C:            Co-evaluation              AM-PAC OT "6 Clicks" Daily  Activity     Outcome Measure Help from another person eating meals?: None Help from another person taking care of personal grooming?: None Help from another person toileting, which includes using toliet, bedpan, or urinal?: None Help from another person bathing (including washing, rinsing, drying)?: None Help from another person to put on and taking off regular upper body clothing?: None Help from another person to put on and taking off regular lower body clothing?: None 6 Click Score: 24   End of Session Nurse Communication: Mobility status  Activity Tolerance: Patient tolerated treatment well Patient left: in chair;with call bell/phone within reach  OT Visit Diagnosis: Muscle weakness (generalized) (M62.81)                Time: 4967-5916 OT Time Calculation (min): 11 min Charges:  OT General Charges $OT Visit: 1 Visit OT Evaluation $OT Eval Low Complexity: 1 Low  Zaiden Ludlum, OTR/L [place]  Office [phone] Pager: [phone]   Lenward Chancellor 03/15/2020, 2:47 PM

## 2020-03-15 NOTE — Progress Notes (Signed)
Pt discharged to home. Prior to DC , IV and tele was removed. Pt was given DC paperwork regarding meds and follow up appts. Pt verbalized understanding and stated no other concerns at this time. Pt stable at time of DC and left in personal vehicle being driven by grandson.

## 2020-03-17 ENCOUNTER — Telehealth: Payer: Self-pay

## 2020-03-17 NOTE — Telephone Encounter (Signed)
Transition Care Management Follow-up Telephone Call  Date of discharge and from where: 03/15/2020, [name]   How have you been since you were released from the hospital? Patient states that he is doing much better.   Any questions or concerns? No  Items Reviewed:  Did the pt receive and understand the discharge instructions provided? Yes   Medications obtained and verified? Yes   Other? No   Any new allergies since your discharge? No   Dietary orders reviewed? Yes  Do you have support at home? Yes   Home Care and Equipment/Supplies: Were home health services ordered? not applicable If so, what is the name of the agency? N/A  Has the agency set up a time to come to the patient's home? not applicable Were any new equipment or medical supplies ordered?  No What is the name of the medical supply agency? N/A Were you able to get the supplies/equipment? not applicable Do you have any questions related to the use of the equipment or supplies? No  Functional Questionnaire: (I = Independent and D = Dependent) ADLs: I  Bathing/Dressing- I  Meal Prep- I  Eating- I  Maintaining continence- I  Transferring/Ambulation- I  Managing Meds- I  Follow up appointments reviewed:   PCP Hospital f/u appt confirmed? Yes  Scheduled to see Dr. Lorelei Pont on 03/24/2020 @ 11:20 am.  [facility] f/u appt confirmed? Yes  Scheduled to see cardiology on 03/19/2020 @ 10:30 am.  Are transportation arrangements needed? No  If their condition worsens, is the pt aware to call PCP or go to the Emergency Dept.? Yes  Was the patient provided with contact information for the PCP's office or ED? Yes  Was to pt encouraged to call back with questions or concerns? Yes

## 2020-03-18 ENCOUNTER — Inpatient Hospital Stay: Payer: Medicare Other | Attending: Physician Assistant

## 2020-03-18 ENCOUNTER — Other Ambulatory Visit: Payer: Self-pay

## 2020-03-18 ENCOUNTER — Other Ambulatory Visit: Payer: Self-pay | Admitting: *Deleted

## 2020-03-18 DIAGNOSIS — C3491 Malignant neoplasm of unspecified part of right bronchus or lung: Secondary | ICD-10-CM | POA: Diagnosis not present

## 2020-03-18 DIAGNOSIS — Z8719 Personal history of other diseases of the digestive system: Secondary | ICD-10-CM | POA: Insufficient documentation

## 2020-03-18 DIAGNOSIS — I129 Hypertensive chronic kidney disease with stage 1 through stage 4 chronic kidney disease, or unspecified chronic kidney disease: Secondary | ICD-10-CM | POA: Diagnosis not present

## 2020-03-18 DIAGNOSIS — E041 Nontoxic single thyroid nodule: Secondary | ICD-10-CM | POA: Diagnosis not present

## 2020-03-18 DIAGNOSIS — Z79899 Other long term (current) drug therapy: Secondary | ICD-10-CM | POA: Diagnosis not present

## 2020-03-18 DIAGNOSIS — I1310 Hypertensive heart and chronic kidney disease without heart failure, with stage 1 through stage 4 chronic kidney disease, or unspecified chronic kidney disease: Secondary | ICD-10-CM | POA: Diagnosis not present

## 2020-03-18 DIAGNOSIS — N1831 Chronic kidney disease, stage 3a: Secondary | ICD-10-CM | POA: Diagnosis not present

## 2020-03-18 DIAGNOSIS — C3431 Malignant neoplasm of lower lobe, right bronchus or lung: Secondary | ICD-10-CM | POA: Diagnosis not present

## 2020-03-18 DIAGNOSIS — Z9221 Personal history of antineoplastic chemotherapy: Secondary | ICD-10-CM | POA: Diagnosis not present

## 2020-03-18 DIAGNOSIS — Z87891 Personal history of nicotine dependence: Secondary | ICD-10-CM | POA: Diagnosis not present

## 2020-03-18 DIAGNOSIS — R000 Tachycardia, unspecified: Secondary | ICD-10-CM | POA: Diagnosis not present

## 2020-03-18 DIAGNOSIS — E1142 Type 2 diabetes mellitus with diabetic polyneuropathy: Secondary | ICD-10-CM | POA: Diagnosis not present

## 2020-03-18 DIAGNOSIS — Z794 Long term (current) use of insulin: Secondary | ICD-10-CM | POA: Diagnosis not present

## 2020-03-18 DIAGNOSIS — I4892 Unspecified atrial flutter: Secondary | ICD-10-CM | POA: Diagnosis not present

## 2020-03-18 DIAGNOSIS — J45909 Unspecified asthma, uncomplicated: Secondary | ICD-10-CM | POA: Diagnosis not present

## 2020-03-18 DIAGNOSIS — N183 Chronic kidney disease, stage 3 unspecified: Secondary | ICD-10-CM | POA: Insufficient documentation

## 2020-03-18 DIAGNOSIS — I4891 Unspecified atrial fibrillation: Secondary | ICD-10-CM | POA: Insufficient documentation

## 2020-03-18 DIAGNOSIS — E1122 Type 2 diabetes mellitus with diabetic chronic kidney disease: Secondary | ICD-10-CM | POA: Insufficient documentation

## 2020-03-18 DIAGNOSIS — I480 Paroxysmal atrial fibrillation: Secondary | ICD-10-CM | POA: Diagnosis not present

## 2020-03-18 DIAGNOSIS — G4733 Obstructive sleep apnea (adult) (pediatric): Secondary | ICD-10-CM | POA: Diagnosis not present

## 2020-03-18 DIAGNOSIS — E11621 Type 2 diabetes mellitus with foot ulcer: Secondary | ICD-10-CM | POA: Diagnosis not present

## 2020-03-18 DIAGNOSIS — L97429 Non-pressure chronic ulcer of left heel and midfoot with unspecified severity: Secondary | ICD-10-CM | POA: Diagnosis not present

## 2020-03-18 DIAGNOSIS — C3490 Malignant neoplasm of unspecified part of unspecified bronchus or lung: Secondary | ICD-10-CM

## 2020-03-18 DIAGNOSIS — D61810 Antineoplastic chemotherapy induced pancytopenia: Secondary | ICD-10-CM | POA: Diagnosis not present

## 2020-03-18 DIAGNOSIS — Z4800 Encounter for change or removal of nonsurgical wound dressing: Secondary | ICD-10-CM | POA: Diagnosis not present

## 2020-03-18 DIAGNOSIS — Z9181 History of falling: Secondary | ICD-10-CM | POA: Diagnosis not present

## 2020-03-18 LAB — CMP (CANCER CENTER ONLY)
ALT: 40 U/L (ref 0–44)
AST: 14 U/L — ABNORMAL LOW (ref 15–41)
Albumin: 2 g/dL — ABNORMAL LOW (ref 3.5–5.0)
Alkaline Phosphatase: 199 U/L — ABNORMAL HIGH (ref 38–126)
Anion gap: 10 (ref 5–15)
BUN: 15 mg/dL (ref 8–23)
CO2: 29 mmol/L (ref 22–32)
Calcium: 8.9 mg/dL (ref 8.9–10.3)
Chloride: 95 mmol/L — ABNORMAL LOW (ref 98–111)
Creatinine: 1.54 mg/dL — ABNORMAL HIGH (ref 0.61–1.24)
GFR, Estimated: 47 mL/min — ABNORMAL LOW (ref >60)
Glucose, Bld: 370 mg/dL — ABNORMAL HIGH (ref 70–99)
Potassium: 4.2 mmol/L (ref 3.5–5.1)
Sodium: 134 mmol/L — ABNORMAL LOW (ref 135–145)
Total Bilirubin: 0.6 mg/dL (ref 0.3–1.2)
Total Protein: 6.5 g/dL (ref 6.5–8.1)

## 2020-03-18 LAB — CBC WITH DIFFERENTIAL (CANCER CENTER ONLY)
Abs Immature Granulocytes: 0.09 10*3/uL — ABNORMAL HIGH (ref 0.00–0.07)
Basophils Absolute: 0 10*3/uL (ref 0.0–0.1)
Basophils Relative: 0 %
Eosinophils Absolute: 0 10*3/uL (ref 0.0–0.5)
Eosinophils Relative: 0 %
HCT: 24.7 % — ABNORMAL LOW (ref 39.0–52.0)
Hemoglobin: 8.2 g/dL — ABNORMAL LOW (ref 13.0–17.0)
Immature Granulocytes: 1 %
Lymphocytes Relative: 6 %
Lymphs Abs: 0.4 10*3/uL — ABNORMAL LOW (ref 0.7–4.0)
MCH: 30.1 pg (ref 26.0–34.0)
MCHC: 33.2 g/dL (ref 30.0–36.0)
MCV: 90.8 fL (ref 80.0–100.0)
Monocytes Absolute: 0.7 10*3/uL (ref 0.1–1.0)
Monocytes Relative: 10 %
Neutro Abs: 5.6 10*3/uL (ref 1.7–7.7)
Neutrophils Relative %: 83 %
Platelet Count: 44 10*3/uL — ABNORMAL LOW (ref 150–400)
RBC: 2.72 MIL/uL — ABNORMAL LOW (ref 4.22–5.81)
RDW: 14.5 % (ref 11.5–15.5)
WBC Count: 6.8 10*3/uL (ref 4.0–10.5)
nRBC: 0 % (ref 0.0–0.2)

## 2020-03-18 LAB — SAMPLE TO BLOOD BANK

## 2020-03-18 NOTE — Progress Notes (Signed)
Primary Care Physician: [name], MD Primary Cardiologist: Dr Radford Pax Primary Electrophysiologist: none Referring Physician: Dr Chiquita Loth is a 74 y.o. male with a history of non-obstructive CAD, HTN, DM type 2, CKD stage 3, SCLC currently on chemotherapy, and atrial flutter who presents for consultation in the [facility].  The patient was initially diagnosed with atrial flutter 11/30/21after presenting to the ED with severe anemia, suspected chemo induced. Patient is not on anticoagulation for a CHADS2VASC score of 4. He was started on BB and rate controlled. Unfortunately he could not be started on anticoagulation due to very low platelet counts. CBC 12/7 showed platelets 44,000. Today, patient remains in rapid atrial flutter. He reports that he has inadvertently only been taking Toprol 12.5 mg daily instead of the 75 mg he was discharged on. He has symptoms of fatigue and SOB.  Today, he denies symptoms of palpitations, chest pain, orthopnea, PND, lower extremity edema, dizziness, presyncope, syncope, snoring, daytime somnolence, bleeding, or neurologic sequela. The patient is tolerating medications without difficulties and is otherwise without complaint today.    Atrial Fibrillation Risk Factors:  he does not have symptoms or diagnosis of sleep apnea. he does not have a history of rheumatic fever.  he has a BMI of Body mass index is 26.19 kg/m.[name] Kitchen Filed Weights   03/19/20 1051  Weight: 85.2 kg    Family History  Problem Relation Age of Onset  . Diabetes Mother   . Alzheimer's disease Mother   . Diabetes Father   . Lung cancer Father        Age 24  . Stroke Father   . Heart attack Father   . Lung cancer Sister        lung  . Heart disease Maternal Grandfather   . COPD Sister   . Heart attack Sister   . Heart disease Sister      Atrial Fibrillation Management history:  Previous antiarrhythmic drugs: none Previous  cardioversions: none Previous ablations: none CHADS2VASC score: 4 Anticoagulation history: none   Past Medical History:  Diagnosis Date  . Allergic rhinitis due to pollen   . Chronic kidney disease, stage III (moderate) ([place]) 12/19/2012  . Depression   . Diabetes mellitus ([place])   . Diverticulosis   . Erectile dysfunction associated with type 2 diabetes mellitus ([place]) 05/28/2015  . Former very heavy cigarette smoker (more than 40 per day) 10/07/2014  . GERD (gastroesophageal reflux disease)   . Gout   . Hyperlipidemia   . Hypertension   . Hypogonadism male 06/14/2012  . Memory loss   . Osteoarthritis   . Personal history of colonic polyps    1996  . Small cell carcinoma of right lung ([place]) 10/23/2019   Past Surgical History:  Procedure Laterality Date  . BRONCHIAL NEEDLE ASPIRATION BIOPSY  10/17/2019   Procedure: BRONCHIAL NEEDLE ASPIRATION BIOPSIES;  Surgeon: Juanito Doom, MD;  Location: [facility];  Service: Cardiopulmonary;;  . CARDIAC CATHETERIZATION  11/2011   [facility]  . CHOLECYSTECTOMY  1980  . ENDOBRONCHIAL ULTRASOUND Bilateral 10/17/2019   Procedure: ENDOBRONCHIAL ULTRASOUND;  Surgeon: Juanito Doom, MD;  Location: [facility];  Service: Cardiopulmonary;  Laterality: Bilateral;  . ESOPHAGOGASTRODUODENOSCOPY (EGD) WITH PROPOFOL N/A 09/27/2017   Procedure: ESOPHAGOGASTRODUODENOSCOPY (EGD) WITH PROPOFOL;  Surgeon: Lin Landsman, MD;  Location: [facility];  Service: Gastroenterology;  Laterality: N/A;  . EYE SURGERY Right 07/29/2016   [facility] - Dr. Manuella Ghazi  . EYE SURGERY Left  08/23/2106   cataract - Dr. Manuella Ghazi  . HEMOSTASIS CONTROL  10/17/2019   Procedure: HEMOSTASIS CONTROL;  Surgeon: Juanito Doom, MD;  Location: [facility];  Service: Cardiopulmonary;;  . IR IMAGING GUIDED PORT INSERTION  11/30/2019  . IRRIGATION AND DEBRIDEMENT ABSCESS Left 11/23/2019   Procedure: IRRIGATION AND DEBRIDEMENT ABSCESS;  Surgeon: Melina Schools, MD;  Location: [facility];  Service:  Orthopedics;  Laterality: Left;  . TOTAL HIP ARTHROPLASTY  2009   Dr. Gladstone Lighter  . TOTAL HIP ARTHROPLASTY      Current Outpatient Medications  Medication Sig Dispense Refill  . acetaminophen (QC ACETAMINOPHEN 8HR ARTH PAIN) 650 MG CR tablet Take 650 mg by mouth every 8 (eight) hours as needed for pain.    [name] Kitchen albuterol (VENTOLIN HFA) 108 (90 Base) MCG/ACT inhaler Inhale 1-2 puffs into the lungs every 6 (six) hours as needed for wheezing or shortness of breath. 8 g 2  . ampicillin (PRINCIPEN) 500 MG capsule Take 500 mg by mouth daily as needed (flare up).     . Ascorbic Acid (VITAMIN C) 1000 MG tablet Take 1,000 mg by mouth daily.    . cholecalciferol (VITAMIN D) 1000 UNITS tablet Take 2,000 Units by mouth daily.     [name] Kitchen donepezil (ARICEPT) 10 MG tablet TAKE 1 TABLET BY MOUTH AT BEDTIME 90 tablet 3  . fluticasone (FLONASE) 50 MCG/ACT nasal spray PLACE 2 SPRAYS IN EACH NOSTRIL DAILY 48 mL 3  . Hyprom-Naphaz-Polysorb-Zn Sulf (CLEAR EYES COMPLETE OP) Place 1 drop into both eyes daily as needed (dry eyes).    . Insulin Pen Needle (PEN NEEDLES) 31G X 5 MM MISC 1 each by Does not apply route 3 (three) times daily. To inject insulin E11.42 90 each 2  . insulin regular human CONCENTRATED (HUMULIN R U-500 KWIKPEN) 500 UNIT/ML kwikpen Inject 40 Units into the skin with breakfast, with lunch, and with evening meal.    . memantine (NAMENDA) 10 MG tablet TAKE 1 TABLET BY MOUTH TWICE A DAY 180 tablet 3  . metoprolol succinate (TOPROL-XL) 25 MG 24 hr tablet Take 2 tablets (50 mg total) by mouth daily for 30 doses. 60 tablet 0  . montelukast (SINGULAIR) 10 MG tablet Take 1 tablet (10 mg total) by mouth at bedtime. 30 tablet 3  . pantoprazole (PROTONIX) 20 MG tablet Take 1 tablet (20 mg total) by mouth daily. 30 tablet 2  . prochlorperazine (COMPAZINE) 10 MG tablet Take 1 tablet (10 mg total) by mouth every 6 (six) hours as needed. 30 tablet 2  . Semaglutide (OZEMPIC, 0.25 OR 0.5 MG/DOSE, [place]) Inject into the skin.     [name] Kitchen simvastatin (ZOCOR) 40 MG tablet TAKE 1 TABLET BY MOUTH AT BEDTIME 90 tablet 2  . venlafaxine XR (EFFEXOR-XR) 75 MG 24 hr capsule TAKE 1 CAPSULE BY MOUTH EVERY MORNING AND 2 CAPSULES AT BEDTIME 270 capsule 1   No current facility-administered medications for this encounter.    Allergies  Allergen Reactions  . Tetracycline Itching and Rash    Social History   Socioeconomic History  . Marital status: Widowed    Spouse name: Not on file  . Number of children: 4  . Years of education: 12th  . Highest education level: Not on file  Occupational History  . Occupation: Counsellor  . Occupation: retired Magazine features editor: [employer]  Tobacco Use  . Smoking status: Former Smoker    Packs/day: 3.00    Years: 35.00    Pack years: 105.00  Types: Cigarettes    Quit date: 04/12/2000    Years since quitting: 19.9  . Smokeless tobacco: Never Used  Vaping Use  . Vaping Use: Never used  Substance and Sexual Activity  . Alcohol use: No  . Drug use: No  . Sexual activity: Never  Other Topics Concern  . Not on file  Social History Narrative   No regular exercise   Caffeine use: yes, dt coke   Lives at home with his mother lives with him.   Wife Fraser Din recently deceased   Right-handed.   Social Determinants of Health   Financial Resource Strain: Low Risk   . Difficulty of Paying Living Expenses: Not hard at all  Food Insecurity: No Food Insecurity  . Worried About Charity fundraiser in the Last Year: Never true  . Ran Out of Food in the Last Year: Never true  Transportation Needs: No Transportation Needs  . Lack of Transportation (Medical): No  . Lack of Transportation (Non-Medical): No  Physical Activity: Inactive  . Days of Exercise per Week: 0 days  . Minutes of Exercise per Session: 0 min  Stress: No Stress Concern Present  . Feeling of Stress : Not at all  Social Connections:   . Frequency of Communication with Friends and Family: Not on file  . Frequency  of Social Gatherings with Friends and Family: Not on file  . Attends Religious Services: Not on file  . Active Member of Clubs or Organizations: Not on file  . Attends Archivist Meetings: Not on file  . Marital Status: Not on file  Intimate Partner Violence: Not At Risk  . Fear of Current or Ex-Partner: No  . Emotionally Abused: No  . Physically Abused: No  . Sexually Abused: No     ROS- All systems are reviewed and negative except as per the HPI above.  Physical Exam: Vitals:   03/19/20 1051  BP: 100/60  Pulse: (!) 168  Weight: 85.2 kg  Height: 5\' 11"  (1.803 m)    GEN- The patient is well appearing elderly male, alert and oriented x 3 today.   Head- normocephalic, atraumatic Eyes-  Sclera clear, conjunctiva pink Ears- hearing intact Oropharynx- clear Neck- supple  Lungs- mild wheeze bilaterally, normal work of breathing Heart- irregular rate and rhythm, tachycardia, no murmurs, rubs or gallops  GI- soft, NT, ND, + BS Extremities- no clubbing, cyanosis, or edema MS- no significant deformity or atrophy Skin- no rash or lesion Psych- euthymic mood, full affect Neuro- strength and sensation are intact  Wt Readings from Last 3 Encounters:  03/19/20 85.2 kg  03/11/20 82.6 kg  03/03/20 84.2 kg    EKG today demonstrates SVT, likely atrial flutter HR 168, QRS 88, QTc 471  Echo 03/12/20 demonstrated  1. Left ventricular ejection fraction, by estimation, is 65 to 70%. The  left ventricle has hyperdynamic function. The left ventricle has no  regional wall motion abnormalities. There is mild concentric left  ventricular hypertrophy. Left ventricular  diastolic parameters are indeterminate.  2. Right ventricular systolic function is normal. The right ventricular  size is normal. Mildly increased right ventricular wall thickness.  3. The mitral valve is grossly normal. No evidence of mitral valve  regurgitation.  4. The aortic valve is tricuspid. There is mild  calcification of the  aortic valve. Aortic valve regurgitation is not visualized.  5. The inferior vena cava is normal in size with greater than 50%  respiratory variability, suggesting right atrial pressure  of 3 mmHg.   Comparison(s): A prior study was performed on 12/24/2014. No significant  change from prior study.   Epic records are reviewed at length today  CHA2DS2-VASc Score = 4  The patient's score is based upon: CHF History: 0 HTN History: 1 Diabetes History: 1 Stroke History: 0 Vascular Disease History: 1 Age Score: 1 Gender Score: 0      ASSESSMENT AND PLAN: 1. Atrial flutter The patient's CHA2DS2-VASc score is 4, indicating a 4.8% annual risk of stroke.   Patient in very rapid atrial flutter today. Difficult situation with anemia and low platelets.  He was inadvertently only taking Toprol 12.5 mg daily. Will increase Toprol to 50 mg daily. Patient will take as soon as he gets home. Not currently on anticoagulation with low platelets, will continue with rate control for now. Close follow up to assess heart rates.   2. Secondary Hypercoagulable State (ICD10:  D68.69) The patient is at significant risk for stroke/thromboembolism based upon his CHA2DS2-VASc Score of 4.  However, the patient is not on anticoagulation due to his high bleeding risk. Per oncology recommendations, not on anticoagulation until platelets >50k. 44K on CBC 12/7.  3. HTN Stable, no changes today.  4. CAD Nonobstructive on cath 2013. No anginal symptoms.   Follow up in the AF clinic within a week. Patient to call Friday with heart rates. ED precautions given.    [facility] [address] [place], [place] [address] [phone] 03/19/2020 11:14 AM

## 2020-03-18 NOTE — Patient Outreach (Signed)
[facility]) Care Management  03/18/2020  ISAAC DUBIE 11-Apr-1946 [id number]   Noted that member discharged from hospital 12/4, diagnosed wit new onset A-flutter.  Call placed to follow up on discharge, state he is "doing alright."  He denies any palpitations, chest pain or shortness of breath but does report his heart rate remains elevated, today's reading was 145.  He is monitoring and recording multiple times a day, as follow up at A-fib clinic tomorrow.  Yolanda Bonine will provide transportation.  Also has visits scheduled with wound clinic on 12/10 and PCP on 12/13.  Foot wound continues to heal, denies any open areas.  He verbalizes understanding regarding management of diabetes and wound healing.  Report blood sugar was elevated today at 201 but has been fairly controlled.  Denies any urgent concerns, encouraged to contact this care manager with questions.  Agrees to follow up within the next month.  Goals Addressed            This Visit's Progress   . [facility] - Careful Skin Care   On track    Follow Up Date 1/7 (date updated)   - clean and dry skin well - use lotion that is suggested by the doctor or nurse    Why is this important?   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   Taking really good care of your skin will help to keep your skin unbroken.    Notes:   11/3 - Wound still in the process of healing  12/7 - Reviewed ongoing wound healing and dressing changes, remain with home health involved    . [facility] - Make and Keep All Appointments   On track    Follow Up Date 1/7 (date updated)   - call to cancel if needed - keep a calendar with appointment dates    Why is this important?   Part of staying healthy is seeing the doctor for follow-up care.  If you forget your appointments, there are some things you can do to stay on track.    Notes:   Continues with cancer treatment appointments  12/7 - Upcoming appointments reviewed, assessed need  for transportation assistance, none needed    . [facility] - Manage Fatigue (Tiredness)   On track    Follow Up Date 1/30   - develop a new routine to improve sleep - eat healthy - maintain healthy weight    Why is this important?   Cancer treatment and its side effects can drain your energy. It can keep you from doing things you would like to do.  There are many things that you can do to manage fatigue.    Notes:   Re-educated on slowly increasing activity to decrease fatigue  12/7 - Discussed overall increasing activity and developing exercise routine    . [facility] - Track and Manage Heart Rate and Rhythm-Atrial Fibrillation       Timeframe:  Short-Term Goal Priority:  Medium Start Date:  03/18/2020                          Expected End Date:      04/18/2020                    - check pulse (heart) rate before taking medicine - check pulse (heart) rate once a day - make a plan to exercise regularly - keep all lab appointments - take  medicine as prescribed    Why is this important?    Atrial fibrillation may have no symptoms. Sometimes the symptoms get worse or happen more often.   It is important to keep track of what your symptoms are and when they happen.   A change in symptoms is important to discuss with your doctor or nurse.   Being active and healthy eating will also help you manage your heart condition.     Notes:       [name], RN, MSN [place] [phone]

## 2020-03-19 ENCOUNTER — Encounter (HOSPITAL_COMMUNITY): Payer: Self-pay | Admitting: Physician Assistant

## 2020-03-19 ENCOUNTER — Ambulatory Visit (HOSPITAL_COMMUNITY)
Admit: 2020-03-19 | Discharge: 2020-03-19 | Disposition: A | Discharge status: Home / Self Care | Payer: Medicare Other | Attending: Physician Assistant | Admitting: Physician Assistant

## 2020-03-19 VITALS — BP 100/60 | HR 168 | Ht 71.0 in | Wt 187.8 lb

## 2020-03-19 DIAGNOSIS — E1122 Type 2 diabetes mellitus with diabetic chronic kidney disease: Secondary | ICD-10-CM | POA: Diagnosis not present

## 2020-03-19 DIAGNOSIS — I483 Typical atrial flutter: Secondary | ICD-10-CM | POA: Diagnosis not present

## 2020-03-19 DIAGNOSIS — I2510 Atherosclerotic heart disease of native coronary artery without angina pectoris: Secondary | ICD-10-CM | POA: Diagnosis not present

## 2020-03-19 DIAGNOSIS — N183 Chronic kidney disease, stage 3 unspecified: Secondary | ICD-10-CM | POA: Insufficient documentation

## 2020-03-19 DIAGNOSIS — I129 Hypertensive chronic kidney disease with stage 1 through stage 4 chronic kidney disease, or unspecified chronic kidney disease: Secondary | ICD-10-CM | POA: Diagnosis not present

## 2020-03-19 DIAGNOSIS — D6869 Other thrombophilia: Secondary | ICD-10-CM | POA: Diagnosis not present

## 2020-03-19 DIAGNOSIS — Z87891 Personal history of nicotine dependence: Secondary | ICD-10-CM | POA: Diagnosis not present

## 2020-03-19 DIAGNOSIS — I441 Atrioventricular block, second degree: Secondary | ICD-10-CM | POA: Diagnosis not present

## 2020-03-19 DIAGNOSIS — I4892 Unspecified atrial flutter: Secondary | ICD-10-CM | POA: Diagnosis not present

## 2020-03-19 MED ORDER — METOPROLOL SUCCINATE ER 25 MG PO TB24: 50.0000 mg | ORAL_TABLET | Freq: Every day | ORAL | 0 refills | Status: DC

## 2020-03-19 NOTE — Patient Instructions (Signed)
Increase metoprolol to 50mg  a day (2 of your 25mg  tablets)

## 2020-03-20 ENCOUNTER — Telehealth: Payer: Self-pay | Admitting: Medical Oncology

## 2020-03-20 NOTE — Telephone Encounter (Signed)
Asking about CT scan-expected 12/14. It is not scheduled and I do not see auth number.

## 2020-03-21 ENCOUNTER — Ambulatory Visit (HOSPITAL_COMMUNITY)
Admission: RE | Admit: 2020-03-21 | Discharge: 2020-03-21 | Disposition: A | Discharge status: Home / Self Care | Payer: Medicare Other | Source: Ambulatory Visit | Attending: Internal Medicine | Admitting: Internal Medicine

## 2020-03-21 ENCOUNTER — Other Ambulatory Visit: Payer: Self-pay

## 2020-03-21 ENCOUNTER — Telehealth: Payer: Self-pay

## 2020-03-21 ENCOUNTER — Encounter: Payer: Medicare Other | Attending: Physician Assistant | Admitting: Physician Assistant

## 2020-03-21 DIAGNOSIS — I4891 Unspecified atrial fibrillation: Secondary | ICD-10-CM | POA: Diagnosis not present

## 2020-03-21 DIAGNOSIS — E1165 Type 2 diabetes mellitus with hyperglycemia: Secondary | ICD-10-CM | POA: Diagnosis not present

## 2020-03-21 DIAGNOSIS — M21372 Foot drop, left foot: Secondary | ICD-10-CM | POA: Diagnosis not present

## 2020-03-21 DIAGNOSIS — F3341 Major depressive disorder, recurrent, in partial remission: Secondary | ICD-10-CM | POA: Diagnosis not present

## 2020-03-21 DIAGNOSIS — Z794 Long term (current) use of insulin: Secondary | ICD-10-CM | POA: Insufficient documentation

## 2020-03-21 DIAGNOSIS — K219 Gastro-esophageal reflux disease without esophagitis: Secondary | ICD-10-CM | POA: Diagnosis not present

## 2020-03-21 DIAGNOSIS — L97512 Non-pressure chronic ulcer of other part of right foot with fat layer exposed: Secondary | ICD-10-CM | POA: Diagnosis not present

## 2020-03-21 DIAGNOSIS — E1142 Type 2 diabetes mellitus with diabetic polyneuropathy: Secondary | ICD-10-CM | POA: Diagnosis not present

## 2020-03-21 DIAGNOSIS — E876 Hypokalemia: Secondary | ICD-10-CM | POA: Diagnosis not present

## 2020-03-21 DIAGNOSIS — J90 Pleural effusion, not elsewhere classified: Secondary | ICD-10-CM | POA: Diagnosis not present

## 2020-03-21 DIAGNOSIS — I2510 Atherosclerotic heart disease of native coronary artery without angina pectoris: Secondary | ICD-10-CM | POA: Diagnosis not present

## 2020-03-21 DIAGNOSIS — R0600 Dyspnea, unspecified: Secondary | ICD-10-CM | POA: Diagnosis not present

## 2020-03-21 DIAGNOSIS — E1122 Type 2 diabetes mellitus with diabetic chronic kidney disease: Secondary | ICD-10-CM | POA: Diagnosis not present

## 2020-03-21 DIAGNOSIS — Z79899 Other long term (current) drug therapy: Secondary | ICD-10-CM | POA: Diagnosis not present

## 2020-03-21 DIAGNOSIS — C3490 Malignant neoplasm of unspecified part of unspecified bronchus or lung: Secondary | ICD-10-CM

## 2020-03-21 DIAGNOSIS — D649 Anemia, unspecified: Secondary | ICD-10-CM | POA: Diagnosis not present

## 2020-03-21 DIAGNOSIS — R9431 Abnormal electrocardiogram [ECG] [EKG]: Secondary | ICD-10-CM | POA: Diagnosis not present

## 2020-03-21 DIAGNOSIS — C3431 Malignant neoplasm of lower lobe, right bronchus or lung: Secondary | ICD-10-CM | POA: Diagnosis not present

## 2020-03-21 DIAGNOSIS — I480 Paroxysmal atrial fibrillation: Secondary | ICD-10-CM | POA: Diagnosis not present

## 2020-03-21 DIAGNOSIS — D701 Agranulocytosis secondary to cancer chemotherapy: Secondary | ICD-10-CM | POA: Diagnosis not present

## 2020-03-21 DIAGNOSIS — E0842 Diabetes mellitus due to underlying condition with diabetic polyneuropathy: Secondary | ICD-10-CM | POA: Diagnosis not present

## 2020-03-21 DIAGNOSIS — L89899 Pressure ulcer of other site, unspecified stage: Secondary | ICD-10-CM | POA: Insufficient documentation

## 2020-03-21 DIAGNOSIS — I10 Essential (primary) hypertension: Secondary | ICD-10-CM | POA: Diagnosis not present

## 2020-03-21 DIAGNOSIS — L97522 Non-pressure chronic ulcer of other part of left foot with fat layer exposed: Secondary | ICD-10-CM | POA: Diagnosis not present

## 2020-03-21 DIAGNOSIS — E781 Pure hyperglyceridemia: Secondary | ICD-10-CM | POA: Diagnosis not present

## 2020-03-21 DIAGNOSIS — Z8719 Personal history of other diseases of the digestive system: Secondary | ICD-10-CM | POA: Diagnosis not present

## 2020-03-21 DIAGNOSIS — Z9221 Personal history of antineoplastic chemotherapy: Secondary | ICD-10-CM | POA: Diagnosis not present

## 2020-03-21 DIAGNOSIS — G4733 Obstructive sleep apnea (adult) (pediatric): Secondary | ICD-10-CM | POA: Diagnosis not present

## 2020-03-21 DIAGNOSIS — I129 Hypertensive chronic kidney disease with stage 1 through stage 4 chronic kidney disease, or unspecified chronic kidney disease: Secondary | ICD-10-CM | POA: Diagnosis not present

## 2020-03-21 DIAGNOSIS — Z923 Personal history of irradiation: Secondary | ICD-10-CM | POA: Diagnosis not present

## 2020-03-21 DIAGNOSIS — N183 Chronic kidney disease, stage 3 unspecified: Secondary | ICD-10-CM | POA: Diagnosis not present

## 2020-03-21 DIAGNOSIS — I2699 Other pulmonary embolism without acute cor pulmonale: Secondary | ICD-10-CM | POA: Diagnosis not present

## 2020-03-21 DIAGNOSIS — E1121 Type 2 diabetes mellitus with diabetic nephropathy: Secondary | ICD-10-CM | POA: Diagnosis not present

## 2020-03-21 DIAGNOSIS — D61810 Antineoplastic chemotherapy induced pancytopenia: Secondary | ICD-10-CM | POA: Diagnosis not present

## 2020-03-21 DIAGNOSIS — R059 Cough, unspecified: Secondary | ICD-10-CM | POA: Diagnosis not present

## 2020-03-21 DIAGNOSIS — C3491 Malignant neoplasm of unspecified part of right bronchus or lung: Secondary | ICD-10-CM | POA: Diagnosis not present

## 2020-03-21 DIAGNOSIS — T451X5A Adverse effect of antineoplastic and immunosuppressive drugs, initial encounter: Secondary | ICD-10-CM | POA: Diagnosis not present

## 2020-03-21 DIAGNOSIS — R000 Tachycardia, unspecified: Secondary | ICD-10-CM | POA: Diagnosis not present

## 2020-03-21 DIAGNOSIS — J9601 Acute respiratory failure with hypoxia: Secondary | ICD-10-CM | POA: Diagnosis not present

## 2020-03-21 DIAGNOSIS — I1310 Hypertensive heart and chronic kidney disease without heart failure, with stage 1 through stage 4 chronic kidney disease, or unspecified chronic kidney disease: Secondary | ICD-10-CM | POA: Diagnosis not present

## 2020-03-21 DIAGNOSIS — L97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scle: Secondary | ICD-10-CM | POA: Diagnosis not present

## 2020-03-21 DIAGNOSIS — E0841 Diabetes mellitus due to underlying condition with diabetic mononeuropathy: Secondary | ICD-10-CM | POA: Diagnosis not present

## 2020-03-21 DIAGNOSIS — R0602 Shortness of breath: Secondary | ICD-10-CM | POA: Diagnosis not present

## 2020-03-21 DIAGNOSIS — N1831 Chronic kidney disease, stage 3a: Secondary | ICD-10-CM | POA: Diagnosis not present

## 2020-03-21 DIAGNOSIS — E11621 Type 2 diabetes mellitus with foot ulcer: Secondary | ICD-10-CM | POA: Insufficient documentation

## 2020-03-21 DIAGNOSIS — J9811 Atelectasis: Secondary | ICD-10-CM | POA: Diagnosis not present

## 2020-03-21 DIAGNOSIS — N1832 Chronic kidney disease, stage 3b: Secondary | ICD-10-CM | POA: Diagnosis not present

## 2020-03-21 DIAGNOSIS — Z20822 Contact with and (suspected) exposure to covid-19: Secondary | ICD-10-CM | POA: Diagnosis present

## 2020-03-21 DIAGNOSIS — Z87891 Personal history of nicotine dependence: Secondary | ICD-10-CM | POA: Diagnosis not present

## 2020-03-21 DIAGNOSIS — E1140 Type 2 diabetes mellitus with diabetic neuropathy, unspecified: Secondary | ICD-10-CM | POA: Diagnosis not present

## 2020-03-21 DIAGNOSIS — I483 Typical atrial flutter: Secondary | ICD-10-CM | POA: Diagnosis not present

## 2020-03-21 MED ORDER — SODIUM CHLORIDE (PF) 0.9 % IJ SOLN
INTRAMUSCULAR | Status: AC
  Filled 2020-03-21: qty 50

## 2020-03-21 MED ORDER — HEPARIN SOD (PORK) LOCK FLUSH 100 UNIT/ML IV SOLN
500.0000 [IU] | Freq: Once | INTRAVENOUS | Status: AC
  Administered 2020-03-21: 500 [IU] via INTRAVENOUS

## 2020-03-21 MED ORDER — IOHEXOL 300 MG/ML  SOLN
100.0000 mL | Freq: Once | INTRAMUSCULAR | Status: AC | PRN
  Administered 2020-03-21: 75 mL via INTRAVENOUS

## 2020-03-21 MED ORDER — HEPARIN SOD (PORK) LOCK FLUSH 100 UNIT/ML IV SOLN
INTRAVENOUS | Status: AC
  Filled 2020-03-21: qty 5

## 2020-03-21 NOTE — Progress Notes (Addendum)
[name], [name] ([id number]) Visit Report for 03/21/2020 Arrival Information Details Patient Name: [name] Date of Service: 03/21/2020 1:45 PM Medical Record Number: [id number] Patient Account Number: [id number] Date of Birth/Sex: 07/01/1945 (74 y.o. M) Treating RN: Cornell Barman Primary Care Taiwo Fish: [name] Other Clinician: Referring Sharmon Cheramie: [name] Treating Randale Carvalho/Extender: Skipper Cliche in Treatment: 15 Visit Information History Since Last Visit Added or deleted any medications: No Patient Arrived: Wheel Chair Has Dressing in Place as Prescribed: Yes Arrival Time: 13:56 Pain Present Now: No Accompanied By: self Transfer Assistance: None Patient Identification Verified: Yes Secondary Verification Process Completed: Yes Patient Requires Transmission-Based Precautions: No Electronic Signature(s) Signed: 03/27/2020 5:16:15 PM By: Gretta Cool, BSN, RN, CWS, Kim RN, BSN Entered By: Gretta Cool, BSN, RN, CWS, Kim on 03/21/2020 13:56:34 [name] ([id number]) -------------------------------------------------------------------------------- Complex / Palliative Patient Assessment Details Patient Name: [name] Date of Service: 03/21/2020 1:45 PM Medical Record Number: [id number] Patient Account Number: [id number] Date of Birth/Sex: Feb 02, 1946 (74 y.o. M) Treating RN: Cornell Barman Primary Care Aeisha Minarik: [name] Other Clinician: Referring Winnie Barsky: [name] Treating Kamali Nephew/Extender: Skipper Cliche in Treatment: 15 Palliative Management Criteria Complex Wound Management Criteria Patient has remarkable or complex co-morbidities requiring medications or treatments that extend wound healing times. Examples: o Diabetes mellitus with chronic renal failure or end stage renal disease requiring dialysis o Advanced or poorly controlled rheumatoid arthritis o Diabetes mellitus and end stage chronic obstructive pulmonary disease o  Active cancer with current chemo- or radiation therapy Active cancer with current chemo Care Approach Wound Care Plan: Complex Wound Management Electronic Signature(s) Signed: 03/21/2020 4:45:27 PM By: Gretta Cool, BSN, RN, CWS, Kim RN, BSN Signed: 03/21/2020 4:50:08 PM By: Worthy Keeler [name] Entered By: Gretta Cool, BSN, RN, CWS, Kim on 03/21/2020 16:45:27 [name] ([id number]) -------------------------------------------------------------------------------- Encounter Discharge Information Details Patient Name: [name] Date of Service: 03/21/2020 1:45 PM Medical Record Number: [id number] Patient Account Number: [id number] Date of Birth/Sex: [birth year] (74 y.o. M) Treating RN: Cornell Barman Primary Care Hayleigh Bawa: [name] Other Clinician: Referring Chelci Wintermute: [name] Treating Ian Cavey/Extender: Skipper Cliche in Treatment: 15 Encounter Discharge Information Items Post Procedure Vitals Discharge Condition: Stable Temperature (F): 97.7 Ambulatory Status: Ambulatory Pulse (bpm): 87 Discharge Destination: Home Respiratory Rate (breaths/min): 16 Transportation: Private Auto Blood Pressure (mmHg): 90/58 Accompanied By: self Schedule Follow-up Appointment: Yes Clinical Summary of Care: Electronic Signature(s) Signed: 03/27/2020 5:16:15 PM By: Gretta Cool, BSN, RN, CWS, Kim RN, BSN Entered By: Gretta Cool, BSN, RN, CWS, Kim on 03/21/2020 14:25:15 [name] ([id number]) -------------------------------------------------------------------------------- Lower Extremity Assessment Details Patient Name: [name] Date of Service: 03/21/2020 1:45 PM Medical Record Number: [id number] Patient Account Number: [id number] Date of Birth/Sex: [birth year] (74 y.o. M) Treating RN: Cornell Barman Primary Care Shamela Haydon: [name] Other Clinician: Referring Aidian Salomon: [name] Treating Annalysia Willenbring/Extender: Skipper Cliche in Treatment: 15 Edema Assessment Assessed:  [Left: No] [Right: No] Edema: [Left: No] [Right: No] Vascular Assessment Pulses: Dorsalis Pedis Palpable: [Left:Yes] [Right:Yes] Electronic Signature(s) Signed: 03/27/2020 5:16:15 PM By: Gretta Cool, BSN, RN, CWS, Kim RN, BSN Entered By: Gretta Cool, BSN, RN, CWS, Kim on 03/21/2020 14:04:28 [name] ([id number]) -------------------------------------------------------------------------------- Multi Wound Chart Details Patient Name: [name] Date of Service: 03/21/2020 1:45 PM Medical Record Number: [id number] Patient Account Number: [id number] Date of Birth/Sex: 1945/10/14 (74 y.o. 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/A Extremity Extremity Secondary Etiology: N/A Pressure Ulcer N/A Comorbid History: Cataracts, Middle ear problems, Cataracts, Middle ear problems, N/A Sleep Apnea, Hypertension, Type II Sleep Apnea, Hypertension, Type II Diabetes, Gout, Osteoarthritis, Diabetes, Gout, Osteoarthritis, Neuropathy, Received Neuropathy, Received Chemotherapy Chemotherapy Date Acquired: 11/23/2019 01/23/2020 N/A Weeks of Treatment: 15 6 N/A Wound Status: Open Open N/A Measurements L x W x D (cm) 0.5x1x0.2 1x0.9x0.2 N/A Area (cm) : 0.393 0.707 N/A Volume (cm) : 0.079 0.141 N/A % Reduction in Area: 93.80% -125.20% N/A % Reduction in Volume: 97.50% -354.80% N/A Classification: Grade 3 Grade 2 N/A Exudate Amount: Medium Medium N/A Exudate Type:  Serosanguineous Serosanguineous N/A Exudate Color: red, brown red, brown N/A Wound Margin: Thickened Flat and Intact N/A Granulation Amount: Small (1-33%) None Present (0%) N/A Granulation Quality: Red, Pink, Hyper-granulation N/A N/A Necrotic Amount: Small (1-33%) Large (67-100%) N/A Exposed Structures: Fat Layer (Subcutaneous Tissue): Fat Layer (Subcutaneous Tissue): N/A Yes Yes Fascia: No Fascia: No Tendon: No Tendon: No Muscle: No Muscle: No Joint: No Joint: No Bone: No Bone: No Epithelialization: Small (1-33%) Small (1-33%) N/A Treatment Notes Electronic Signature(s) Signed: 03/27/2020 5:16:15 PM By: Gretta Cool, BSN, RN, CWS, Kim RN, BSN Entered By: Gretta Cool, BSN, RN, CWS, Kim on 03/21/2020 14:11:53 [name], [name] ([id number]) [name], [name] ([id number]) -------------------------------------------------------------------------------- [place] Details Patient Name: [name] Date of Service: 03/21/2020 1:45 PM Medical Record Number: [id number] Patient Account Number: [id number] Date of Birth/Sex: Aug 12, 1945 (74 y.o. M) Treating RN: Cornell Barman Primary Care Lataysha Vohra: [name] Other Clinician: Referring Rasa Degrazia: [name] Treating Adante Courington/Extender: Skipper Cliche in Treatment: 15 Active Inactive Necrotic Tissue Nursing Diagnoses: Impaired tissue integrity related to necrotic/devitalized tissue Goals: Necrotic/devitalized tissue will be minimized in the wound bed Date Initiated: 12/03/2019 Target Resolution Date: 12/07/2019 Goal Status: Active Interventions: Assess patient pain level pre-, during and post procedure and prior to discharge Treatment Activities: Apply topical anesthetic as ordered : 12/03/2019 Notes: Orientation to the Wound Care Program Nursing Diagnoses: Knowledge deficit related to the wound healing center program Goals: Patient/caregiver will verbalize understanding of the [place] Date Initiated: 12/03/2019 Target Resolution Date: 12/07/2019 Goal Status: Active Interventions: Provide education on orientation to the wound center Notes: Pain, Acute or Chronic Nursing Diagnoses: Pain Management - Cyclic Acute (Dressing Change Related) Goals: Patient will verbalize adequate pain control and receive pain control interventions during procedures as needed Date Initiated: 12/03/2019 Target Resolution Date: 12/07/2019 Goal Status: Active Interventions: Assess comfort goal upon admission Treatment Activities: Administer pain control measures as ordered : 12/03/2019 Notes: Wound/Skin Impairment Nursing Diagnoses: Impaired tissue integrity [name], [name] ([id number]) Goals: Ulcer/skin breakdown will have a volume reduction of 30% by week 4 Date Initiated: 12/03/2019 Target Resolution Date: 01/03/2020 Goal Status: Active Ulcer/skin breakdown will have a volume reduction of 50% by week 8 Date Initiated: 12/03/2019 Target Resolution Date: 02/02/2020 Goal Status: Active Ulcer/skin breakdown will have a volume reduction of 80% by week 12 Date Initiated: 12/03/2019 Target Resolution Date: 03/04/2020 Goal Status: Active Interventions: Assess ulceration(s) every visit Treatment Activities: Referred to DME Adleigh Mcmasters for dressing supplies : 12/03/2019 Topical wound management initiated : 12/03/2019 Notes: Electronic Signature(s) Signed: 03/27/2020 5:16:15 PM By: Gretta Cool, BSN, RN, CWS, Kim RN, BSN Entered By: Gretta Cool, BSN, RN, CWS,  Kim on 03/21/2020 14:11:46 [name], [name] ([id number]) -------------------------------------------------------------------------------- Pain Assessment Details Patient Name: [name], [name] Date of Service: 03/21/2020 1:45 PM Medical Record Number: [id number] Patient Account Number: [id number] Date of Birth/Sex: [birth year] (74 y.o. M) Treating RN: Cornell Barman Primary Care Kang Ishida: [name] Other Clinician: Referring Langston Summerfield:  [name] Treating Simrin Vegh/Extender: Skipper Cliche in Treatment: 15 Active Problems Location of Pain Severity and Description of Pain Patient Has Paino No Site Locations Pain Management and Medication Current Pain Management: Notes Patient denies pain at this time, Electronic Signature(s) Signed: 03/27/2020 5:16:15 PM By: Gretta Cool, BSN, RN, CWS, Kim RN, BSN Entered By: Gretta Cool, BSN, RN, CWS, Kim on 03/21/2020 13:57:07 [name] ([id number]) -------------------------------------------------------------------------------- Patient/Caregiver Education Details Patient Name: [name] Date of Service: 03/21/2020 1:45 PM Medical Record Number: [id number] Patient Account Number: [id number] Date of Birth/Gender: 1946/03/08 (74 y.o. M) Treating RN: Cornell Barman Primary Care Physician: [name] Other Clinician: Referring Physician: [name] Treating Physician/Extender: Skipper Cliche in Treatment: 15 Education Assessment Education Provided To: Patient Education Topics Provided Wound Debridement: Handouts: Wound Debridement Methods: Demonstration, Explain/Verbal Responses: State content correctly Electronic Signature(s) Signed: 03/27/2020 5:16:15 PM By: Gretta Cool, BSN, RN, CWS, Kim RN, BSN Entered By: Gretta Cool, BSN, RN, CWS, Kim on 03/21/2020 14:24:15 [name] ([id number]) -------------------------------------------------------------------------------- Wound Assessment Details Patient Name: [name] Date of Service: 03/21/2020 1:45 PM Medical Record Number: [id number] Patient Account Number: [id number] Date of Birth/Sex: 11/16/1945 (74 y.o. M) Treating RN: Cornell Barman Primary Care Edmundo Tedesco: [name] Other Clinician: Referring Marissa Lowrey: [name] Treating Darien Mignogna/Extender: Skipper Cliche in Treatment: 15 Wound Status Wound Number: 7 Primary Diabetic Wound/Ulcer of the Lower Extremity Etiology: Wound Location: Left,  Lateral, Plantar Foot Wound Open Wounding Event: Surgical Injury Status: Date Acquired: 11/23/2019 Comorbid Cataracts, Middle ear problems, Sleep Apnea, Weeks Of Treatment: 15 History: Hypertension, Type II Diabetes, Gout, Osteoarthritis, Clustered Wound: No Neuropathy, Received Chemotherapy Photos Wound Measurements Length: (cm) 0.5 Width: (cm) 1 Depth: (cm) 0.2 Area: (cm) 0.393 Volume: (cm) 0.079 % Reduction in Area: 93.8% % Reduction in Volume: 97.5% Epithelialization: Small (1-33%) Wound Description Classification: Grade 3 F Wound Margin: Thickened S Exudate Amount: Medium Exudate Type: Serosanguineous Exudate Color: red, brown oul Odor After Cleansing: No lough/Fibrino Yes Wound Bed Granulation Amount: Small (1-33%) Exposed Structure Granulation Quality: Red, Pink, Hyper-granulation Fascia Exposed: No Necrotic Amount: Small (1-33%) Fat Layer (Subcutaneous Tissue) Exposed: Yes Necrotic Quality: Adherent Slough Tendon Exposed: No Muscle Exposed: No Joint Exposed: No Bone Exposed: No Treatment Notes Wound #7 (Left, Lateral, Plantar Foot) Notes Prisma Ag, Bordered foam Electronic Signature(s) [name], [name] ([id number]) Signed: 03/27/2020 5:16:15 PM By: Gretta Cool, BSN, RN, CWS, Kim RN, BSN Entered By: Gretta Cool, BSN, RN, CWS, Kim on 03/21/2020 14:03:33 [name] ([id number]) -------------------------------------------------------------------------------- Wound Assessment Details Patient Name: [name] Date of Service: 03/21/2020 1:45 PM Medical Record Number: [id number] Patient Account Number: [id number] Date of Birth/Sex: 1945-04-23 (74 y.o. M) Treating RN: Cornell Barman Primary Care Kemiyah Tarazon: [name] Other Clinician: Referring Carroll Lingelbach: [name] Treating Genea Rheaume/Extender: Skipper Cliche in Treatment: 15 Wound Status Wound Number: 9 Primary Diabetic Wound/Ulcer of the Lower Extremity Etiology: Wound Location: Right, Dorsal  Foot Secondary Pressure Ulcer Wounding Event: Pressure Injury Etiology: Date Acquired: 01/23/2020 Wound Open Weeks Of Treatment: 6 Status: Clustered Wound: No Comorbid Cataracts, Middle ear problems, Sleep Apnea, History: Hypertension, Type II Diabetes, Gout, Osteoarthritis, Neuropathy, Received Chemotherapy Photos Wound Measurements Length: (cm) 1 % Reduct Width: (cm) 0.9 % Reduct Depth: (cm) 0.2  [place] Area: (cm) 0.707 Volume: (cm) 0.141 ion in Area: -125.2% ion in Volume: -354.8% alization: Small (1-33%) Wound Description Classification: Grade 2 Foul Od Wound Margin: Flat and Intact Slough/ Exudate Amount: Medium Exudate Type: Serosanguineous Exudate Color: red, brown or After Cleansing: No Fibrino Yes Wound Bed Granulation Amount: None Present (0%) Exposed Structure Necrotic Amount: Large (67-100%) Fascia Exposed: No Necrotic Quality: Adherent Slough Fat Layer (Subcutaneous Tissue) Exposed: Yes Tendon Exposed: No Muscle Exposed: No Joint Exposed: No Bone Exposed: No Treatment Notes Wound #9 (Right, Dorsal Foot) Notes Prisma Ag, Bordered foam [name], [name] ([id number]) Electronic Signature(s) Signed: 03/27/2020 5:16:15 PM By: Gretta Cool, BSN, RN, CWS, Kim RN, BSN Entered By: Gretta Cool, BSN, RN, CWS, Kim on 03/21/2020 14:04:06 [name] ([id number]) -------------------------------------------------------------------------------- [place] Details Patient Name: [name] Date of Service: 03/21/2020 1:45 PM Medical Record Number: [id number] Patient Account Number: [id number] Date of Birth/Sex: Aug 27, 1945 (74 y.o. M) Treating RN: Cornell Barman Primary Care Casyn Becvar: [name] Other Clinician: Referring Juwan Vences: [name] Treating Petro Talent/Extender: Skipper Cliche in Treatment: 15 Vital Signs Time Taken: 13:56 Temperature (F): 97.7 Height (in): 71 Pulse (bpm): 87 Weight (lbs): 190 Respiratory Rate (breaths/min): 18 Body Mass  Index (BMI): 26.5 Blood Pressure (mmHg): 90/58 Reference Range: 80 - 120 mg / dl Electronic Signature(s) Signed: 03/27/2020 5:16:15 PM By: Gretta Cool, BSN, RN, CWS, Kim RN, BSN Entered By: Gretta Cool, BSN, RN, CWS, Kim on 03/21/2020 13:56:53

## 2020-03-21 NOTE — Progress Notes (Addendum)
[name], [name] ([id number]) Visit Report for 03/21/2020 Chief Complaint Document Details Patient Name: [name], [name] Date of Service: 03/21/2020 1:45 PM Medical Record Number: [id number] Patient Account Number: [id number] Date of Birth/Sex: 1945/08/24 (74 y.o. M) Treating RN: Cornell Barman Primary Care Provider: [name] Other Clinician: Referring Provider: [name] Treating Provider/Extender: Skipper Cliche in Treatment: 15 Information Obtained from: Patient Chief Complaint Left plantar foot ulcer s/p surgical debridement and wound VAC placement. Left great toe ulcer. Electronic Signature(s) Signed: 03/21/2020 1:53:05 PM By: Worthy Keeler [name] Entered By: Worthy Keeler on 03/21/2020 13:53:05 [name] ([id number]) -------------------------------------------------------------------------------- Debridement Details Patient Name: [name] Date of Service: 03/21/2020 1:45 PM Medical Record Number: [id number] Patient Account Number: [id number] Date of Birth/Sex: 12/10/1945 (74 y.o. M) Treating RN: Cornell Barman Primary Care Provider: [name] Other Clinician: Referring Provider: [name] Treating Provider/Extender: Skipper Cliche in Treatment: 15 Debridement Performed for Wound #7 Left,Lateral,Plantar Foot Assessment: Performed By: Physician Tommie Sams., [name] Debridement Type: Debridement Severity of Tissue Pre Debridement: Fat layer exposed Level of Consciousness (Pre- Awake and Alert procedure): Pre-procedure Verification/Time Out Yes - 14:10 Taken: Total Area Debrided (L x W): 0.5 (cm) x 1 (cm) = 0.5 (cm) Tissue and other material Viable, Non-Viable, Callus, Slough, Subcutaneous, Slough debrided: Level: Skin/Subcutaneous Tissue Debridement Description: Excisional Instrument: Curette Bleeding: Moderate Hemostasis Achieved: Pressure Response to Treatment: Procedure was tolerated well Level of Consciousness  (Post- Awake and Alert procedure): Post Debridement Measurements of Total Wound Length: (cm) 0.5 Width: (cm) 1 Depth: (cm) 0.3 Volume: (cm) 0.118 Character of Wound/Ulcer Post Debridement: Stable Severity of Tissue Post Debridement: Fat layer exposed Post Procedure Diagnosis Same as Pre-procedure Electronic Signature(s) Signed: 03/21/2020 4:50:08 PM By: Worthy Keeler [name] Signed: 03/27/2020 5:16:15 PM By: Gretta Cool, BSN, RN, CWS, Kim RN, BSN Entered By: Gretta Cool, BSN, RN, CWS, Kim on 03/21/2020 14:13:03 [name] ([id number]) -------------------------------------------------------------------------------- Debridement Details Patient Name: [name] Date of Service: 03/21/2020 1:45 PM Medical Record Number: [id number] Patient Account Number: [id number] Date of Birth/Sex: 11-24-1945 (74 y.o. M) Treating RN: Cornell Barman Primary Care Provider: [name] Other Clinician: Referring Provider: [name] Treating Provider/Extender: Skipper Cliche in Treatment: 15 Debridement Performed for Wound #9 Right,Dorsal Foot Assessment: Performed By: Physician Tommie Sams., [name] Debridement Type: Debridement Severity of Tissue Pre Debridement: Limited to breakdown of skin Level of Consciousness (Pre- Awake and Alert procedure): Pre-procedure Verification/Time Out Yes - 14:10 Taken: Total Area Debrided (L x W): 1 (cm) x 0.9 (cm) = 0.9 (cm) Tissue and other material Viable, Non-Viable, Slough, Subcutaneous, Slough debrided: Level: Skin/Subcutaneous Tissue Debridement Description: Excisional Instrument: Curette Bleeding: Moderate Hemostasis Achieved: Pressure Response to Treatment: Procedure was tolerated well Level of Consciousness (Post- Awake and Alert procedure): Post Debridement Measurements of Total Wound Length: (cm) 1 Width: (cm) 0.9 Depth: (cm) 0.4 Volume: (cm) 0.283 Character of Wound/Ulcer Post Debridement: Stable Severity of Tissue Post  Debridement: Fat layer exposed Post Procedure Diagnosis Same as Pre-procedure Electronic Signature(s) Signed: 03/21/2020 4:50:08 PM By: Worthy Keeler [name] Signed: 03/27/2020 5:16:15 PM By: Gretta Cool, BSN, RN, CWS, Kim RN, BSN Entered By: Gretta Cool, BSN, RN, CWS, Kim on 03/21/2020 14:14:23 [name] ([id number]) -------------------------------------------------------------------------------- HPI Details Patient Name: [name] Date of Service: 03/21/2020 1:45 PM Medical Record Number: [id number] Patient Account Number: [id number] Date of Birth/Sex: 1945-06-04 (74 y.o. M) Treating RN: Cornell Barman Primary Care Provider: [name] Other Clinician: Referring Provider: [name] Treating Provider/Extender: Skipper Cliche in  Treatment: 15 History of Present Illness HPI Description: 10/04/16 on evaluation today patient presents for initial evaluation concerning a laceration of the right wrist and hand on the bowler aspect. He tells me that he fell into glass following a medication change in regard to his diabetes. He initially went to [facility] where she was sutured and subsequently his daughter who is a Marine scientist removed the teachers at the appropriate time. Unfortunately the wound has done poorly and dehissed which has caused him some trouble including infection. He has been on antibiotics both topically and orally though the wound continues to show signs of necrotic tissue according to notes which is what he was sent to [place] for evaluation concerning. He does have diabetes and unfortunately this is severely uncontrolled. He does see an endocrinologist and is working on this. 10/11/2016 -- the patient's notes from the ER were reviewed and he had his suturing done on 09/17/2016 and had necrotic skin flaps which had to be trimmed the last visit he was here. He has managed to get his Santyl ointment and has been using this appropriately. 10/21/16 patient's wound appears to be  doing very well on evaluation today. He still has some Slough covering the wound bed but this is nowhere near as severe as when i saw this initially two weeks ago nor even my review of his pictures last week. I'm pleased with how this is progressing. Patient's wound over the right form appears to be doing fairly well on evaluation today. There is no evidence of infection and he has a small area at the crease of where she is wrist extends and flexes that is still open and appears to be having difficulty due to the fact that it is cracking open. I think this is something that may be controlled with moisture control that is keeping the wound a little bit more moist. Nonetheless this is a tiny region compared to the initial wound and overall appears to be doing very well. No fevers, chills, nausea, or vomiting noted at this time. 11/25/16 on evaluation today patient appears to be doing well in regard to his right wrist ulcer. In fact this appears to be completely healed he is having no discomfort. He is pleased with how this is progressed. Readmission: 06/24/17 on evaluation today patient appears back in our clinic regarding issues that he has been having with his right ankle on the lateral malleolus as well is in the dorsal surface of his left foot. Both have been going on for several weeks unfortunately. He tells me that initially this was being managed by Dr. Edilia Bo and I did review the notes from Dr. Edilia Bo in epic. Patient was placed on amoxicillin initially and then subsequently Keflex following. He has been using triple antibiotic ointment over-the-counter along with a Band-Aid for the wound currently. He did not have any Santyl remaining. His most recent hemoglobin A1c which was just two days ago was 10.3. Fortunately he seems to have excellent blood flow in the bilateral lower extremities with his left ABI being 0.95 in the right ABI 1.01. Patient does have some pain in regard to each of these  ulcerations but he tells me it's not nearly as severe as the hand wound that I help treat earlier over the summer. This was in 2018. 07/15/17 on evaluation today patient's lower surety ulcer is actually both appear to be doing fairly well at this point. He does have a little bit of skin overlapping the ulcer on  the right lateral ankle this was debrided away today just with saline and gauze without complication patient had no discomfort or pain. Otherwise patient's left foot ulcer does appear to be showing signs of improvement he does seem to have a right great toe. Nikki and noted at this point in time. I think that this does need to be managed at this point. No fevers, chills, nausea, or vomiting noted at this time. 07/22/17 on evaluation today patient's wounds in regard to his lower extremities appear to be doing somewhat better. His right ankle wound in fact appears to likely be completely closed. The left dorsal foot ulcer though still open does appear to be a little bit smaller and does also seem to be making good progress. He has not been having problems with the treatment at this point in the general he does not seem to show as much erythema surrounding the wound bed which is good news. He is not having significant discomfort. 08/12/17 on evaluation today patients wounds of both appeared to be doing better in fact they both appear to be completely healed. Overall I'm very pleased with how things have progressed over the past week even more than what I expected. Obviously this is good news and he is happy about this. He did have a fall when going to his orthopedic doctor recently fortunately he does not appear to have injured anything severely but nonetheless does have a couple of bumps and bruises nothing significant at this time but he needs to be seen and wound care for. He may have broken his left wrist he is following up with orthopedics in that regard. Readmission: 10/24/17 on evaluation today  patient presents for reevaluation after having been discharged Aug 12, 2017. His wound was healed at that point although he states that it has been somewhat discolored since that time. He has not experienced the drainage as far as the wound is concerned but the fact that it was still discolored been greater than two months after the fact made him want to come in for reevaluation. Upon inspection today the patient has no pain although he does have neuropathy. He does have a discolored region of the dorsal surface of the left foot which was the site where the ulcer was that we previously treated. The good news is he does not again have any discomfort although obviously I do believe this may be some scar tissue and just discoloration in that regard. Again he hasn't had any drainage or discharge since I last saw him and at this point I see no evidence of that either this area shows complete epithelialization. In general I do not feel like there's any significant issue at this point in that regard. 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 He has been in this clinic before in 2018 with a laceration on his right hand and then again in 2019 with an area over the right lateral malleolus. 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 He states that the wound is been there for the last week or AMERE, BRICCO. ([id number]) 2. It is been uncomfortable to walk on. The man is an active man having to look after an elderly mother. Past medical history, type 2 diabetes with polyneuropathy with a recent hemoglobin A1c in September 2011 0.3, lumbar disc disease, hypergonadism., Left foot  drop and gout. We did not repeat his ABIs in the clinic quoting values from July 2019 of 0.95 and 1.01. He is not felt to have significant PAD 02/19/2019 on evaluation  today patient actually appears to be doing better with regard to his foot wound based on what I am seeing currently. He was seen by Dr. Dellia Nims on readmission back into the clinic last week when I was off. The good news is the patient has been tolerating the dressing changes without any complication in fact has not really had much in the way of drainage. He does have an AFO brace which she believes is actually pushing on this area as well and has contributed to the issue. Fortunately there is no signs of active infection at this point. His ABIs appear to be well. 03/05/2019 on evaluation today patient appears to be doing well in regard to his foot ulcer. He has been tolerating the dressing changes without complication. Fortunately there is no signs of active infection. The wound is measuring somewhat smaller and overall I feel like he is making good progress at this point. 03/12/2019 on evaluation today patient actually appears to be doing quite well with regard to his foot ulcer. The wound is looking better the measurement is not significantly better at this point unfortunately but nonetheless again the overall appearance is improving I think he is making some progress. I still think the big issue here is foot drop and again the patient wants to see if potentially he could get a brace to help with this. Subsequently I think that we can definitely make a referral to [facility] to see if they can help [place] in this regard. He is in agreement with that plan. 03/26/2019 on evaluation today patient appears to be doing more poorly in regard to his foot ulcer at this time. He did see podiatry in order to see about getting a brace but unfortunately he tells me that Medicare is not likely to pay for one for him as he had 1 I guess 8 months ago. However this one that apparently goes in his shoes that does not seem to be working out well for him. Unfortunately there are signs of active infection at this time. No  fevers, chills, nausea, vomiting, or diarrhea. Systemically. The patient has erythema on the dorsal surface of his foot that tracks from the lateral portion of his foot and again he does have a blister around the wound as well which is worse compared to previous. Overall I am concerned. 04/02/2019 on evaluation today patient appears to be doing about the same with regard to his wound. Unfortunately there is no signs of significant improvement although I do think there is no signs of infection which is good news. His drainage is also slowed down tremendously. I think at this point he would benefit from the initiation of a total contact cast. The only concern I have is that over his balance. I explained to the patient that if we were to initiate the cast I think it would be greatly beneficial for him but he would need to ensure not to walk at all unless he uses a walker he actually has 2 walkers one for inside his home and one that he keeps out in the car. For that reason he would be covered in that regard. I just do not want him to have any accidents and fall with the cast. 12/28-Patient comes in 1 week out a day early for his  appointment he was in the hospital for 2 days for uncontrolled hyperglycemia and required management of high blood sugars, with some adjustments in his home regimen of insulin, patient stubbed his right great toe today and put a Band-Aid on it, he is here for his left lateral foot ulcer and we are using silver alginate dressing, he was intact total contact cast with difficulty with balance to the extent that that had to be discontinued 04/16/2019 on evaluation today patient appears to be doing better with regard to the overall appearance of his wound size. This is good news. We had attempted a total contact cast but unfortunately he had some falling issues that the family contacted [place] about we took the cast off we did not put it back on. Nonetheless it ended up that just a couple  days later he was in the hospital due to elevations in his blood sugar which went to around 500 and even a little higher. Subsequently he was admitted to the hospital and was in the hospital for a total of 3 days and that included Christmas day. Fortunately there got his blood sugar under control he tells me that this is running about 200 now which is much better news. There does not appear to be any signs of active infection at this time. No fevers, chills, nausea, vomiting, or diarrhea. 04/30/2019 on evaluation today patient actually appears to be doing slightly worse compared to previous as far as the overall size of his wound is concerned. There also is some need for sharp debridement today as well. Fortunately there is no evidence of active infection which is good news. No fevers, chills, nausea, vomiting, or diarrhea. 05/07/2019 upon evaluation today patient appears to be doing excellent in regard to his plantar foot ulcer. He has been tolerating the dressing changes without complication. Fortunately there is no signs of active infection at this time. No fevers, chills, nausea, vomiting, or diarrhea. He does tell me that he actually got in touch with [employer] and they feel like they can add diagnosis codes to his orders in order to get an external brace for him as opposed to the one that goes internal to issue. That would be excellent for his foot drop and I did tell him that if there is anything I need to fill out in that regard I will be more than happy to oblige and fill out what is needed for [employer]. 05/14/2019 upon evaluation today patient's original wound site actually appears to be doing much better which is good news. Fortunately there is no signs of active infection at this time. No fevers, chills, nausea, vomiting, or diarrhea. With that being said the patient unfortunately is continuing to have issues with rubbing he tells me that the felt slid on his foot although he did not realize it  until today. Nonetheless I think this did cause a blister around the edge of the wound and he does an open wound that is slightly larger overall in measurement compared to last time mainly due to this blister. Other than that I feel like he is doing quite well. 05/21/2019 upon evaluation today patient appears to be doing excellent in regard to his wound at this time on the plantar foot. He is showing signs of improvement he does have some slough and biofilm noted on the surface of the wound this is going require some sharp debridement today as well. Fortunately there is no evidence of active infection. No fevers, chills, nausea, vomiting, or  diarrhea. 05/28/2019 on evaluation today patient's wound actually appears to be measuring smaller upon initial inspection and I do believe that it is doing well. With that being said he continues to have a blister on the outside/lateral edge of the wound that occurs as a result of rubbing due to his dropfoot currently. Subsequently we have filled out the paperwork for his new external brace but again that does take several weeks to get that completed once everything is returned to back we did send this back last week as far as the paperwork was concerned. With that being said there does not appear to be any signs of active infection at this time. 06/04/2019 on evaluation today patient's wound actually appears to be doing slightly better compared to last week which is good news. He still states that his insurance unfortunately does not seem to want to cover any of his new brace. He is going to just pay for this himself. Nonetheless he is going by there today when he leaves here that is [employer] in [place] that is going to be making this brace for him. 06/18/2019 upon evaluation today patient fortunately does have his new brace which is the external AFO brace with bilateral uprights. Subsequently he seems to be doing much better in fact the wound already appears better.  He has not even been using the felt he is just using a dressing over [place] ([id number]) top of the wound and overall I am very pleased with how things seem to be progressing. There is no signs of active infection at this time which is great news and I think that the AFO is good to help him tremendously as far as trying to keep pressure off of the 06/25/2019 upon evaluation today patient seems to be making good progress in regard to his wound. He has been tolerating the dressing changes without complication. Fortunately there is no signs of active infection at this time. No fevers, chills, nausea, vomiting, or diarrhea. 07/02/2019 upon evaluation today patient unfortunately had a blistered area on the bottom of his foot tracking more towards the midline of the plantar aspect. He does not seem to have issues with rubbing more laterally that he used to have but nonetheless this is still giving him some trouble and causing problems here. Fortunately there is no signs of active infection at this time. No fevers, chills, nausea, vomiting, or diarrhea. 07/09/2019 upon evaluation today patient appears to be doing well with regard to his plantar foot ulcer. Definitely this is much improved compared to last week's visit which again last week the issue there was that he had an area of callus buildup on the plantar aspect of his foot I was able to clean this away and after effectively doing so the wound seems to be doing much better which is great news. There is no signs of active infection at this time. 07/16/2019 upon evaluation today patient appears to be making some progress in my opinion with regard to his plantar foot ulcer. He has been tolerating the dressing changes without complication. Fortunately there is no evidence of active infection at this time which is great news. 07/23/2019 upon evaluation today patient appears to be doing well with regard to his wounds on the plantar foot. He has been  tolerating the dressing changes without complication. These do seem to be healing. 08/06/2019 upon evaluation today patient's wound actually appears to be showing some signs of improvement though he still has a significant amount of  callus buildup over this location. That is can require some debridement today. With that being said the actual wound openings seem to be dramatically improved which is great news. There is no signs of active infection at this time. No fevers, chills, nausea, vomiting, or diarrhea. 08/13/2019 upon evaluation today patient appears to be doing well with regard to his foot ulcer. He has been tolerating the dressing changes without complication. Fortunately there is no signs of active infection at this time. No fevers, chills, nausea, vomiting, or diarrhea. 08/20/2019 upon evaluation today patient appears to be doing excellent in regard to his foot ulcer. I do believe he still showing signs of improvement this is very slow but again I think still pressure getting to the area even with his brace and custom shoe is still part of the main issue with the delay here. Nonetheless I do not see any signs of worsening or infection all of which is good news. 5/20; left lateral foot wound. He wears an AFO brace for foot drop related to his diabetes. The wound is small and clean looking he is using silver collagen. We have apparently tried him in offloading shoe he could not tolerate this. He does not have good balance. 09/11/2019 upon evaluation today patient appears to be doing more poorly in regard to his foot ulcer as compared to last time I saw him. Unfortunately he does tell me as we discussed on Friday that he had pus and blood coming from the area underneath the callus. Unfortunately I think that this is a combination of the callus not being trimmed away enough and potentially the felt pad causing an abnormal rubbing which has happened before which led to this becoming more irritated and  likely infected based on what I am seeing today. The patient takes ampicillin all the time. Subsequently he also states that he has been trying for a couple of days to take the clindamycin of which she had some leftover from previous. With that being said it upset his stomach. The antibiotic that I sent in for him on Friday unfortunately I wrote the wrong number on as far as the number of pills that he needed. With that being said I corrected that this morning but he tells me is not can pick it up as that seems to be making him have trouble with his stomach being upset at this point. Fortunately there is no signs of active infection at this time systemically. 09/17/2019 upon evaluation today patient appears to be doing better in regard to his foot ulcer. Has been taking ampicillin that he had leftover from a previous prescription. With that being said he did go see his doctor today concerning his diabetes and notes that his hemoglobin A1c was 13 obviously this is elevated and there can be working on that. Also did review the culture result that we finally got back for final well that showed rare Pseudomonas and a few group B strep noted. The Pseudomonas was sensitive to Cipro but again the wound is doing so much better with the ampicillin I think this is probably not a causative organism I do not see anything that really has me concerned as far as infection is concerned today. Therefore I would hold off on prescribing Cipro for the patient and have him discontinue the ampicillin until completion. 09/24/2019 upon evaluation today patient appears to be doing 09/24/2019 upon evaluation today patient appears to be doing well with regard to his foot ulcer. He actually has not  enlarged any and seems to be measuring smaller quite significantly at this point. His toe ulcer is also doing somewhat better which is good news. Fortunately there is no evidence of active infection at this time. No fevers, chills, nausea,  vomiting, or diarrhea. 10/08/2019 upon evaluation today patient appears to be doing well with regard to his foot ulcer all things considered. Fortunately there is no signs of active infection at this time. No fevers, chills, nausea, vomiting, or diarrhea. Fortunately he states that overall he has been doing much better with regard to not have any blistering on the side of his foot. Unfortunately he did have a fall last night due to the fact that he got up to walk without his brace on and did not have his walker either it was around 4 in the morning and his mother was calling out. Subsequently he states that the foot drop got him and he ended up following. Nonetheless he states he normally uses his walker I explained he needs to try to make sure not to ever go without the walker. No matter how attempting the situation may be. Readmission: 12/03/2019 upon evaluation today patient presents for follow-up after having had surgery last week on his foot for surgical debridement and then subsequent wound VAC placement. He had gone into the hospital as he was having a significant infection and not feeling well. He is unfortunately since I last saw him also been under the care of the cancer doctors and they have been taking good care of him. He does seem to be in fairly high spirits all things considered. With that being said he has lung cancer he tells me that his back from the radiation feels like it is constantly burned. 12/10/2019 on evaluation today patient appears to be doing well with regard to his wound. I do believe the wound VAC is doing a great job at helping to manage his drainage and overall health and to granulate in the wound. He has less undermining noted today in fact almost none. Home health is coming out to change the wound VAC on a regular basis Monday, Wednesday, and Friday. 12/24/2019 on evaluation today patient appears to be doing fairly well in regard to the wound on the plantar aspect of  his foot. With that being said he does have a significant feeling in of what has been going on with regard to the ulceration which does not have the depth that it did in the past. With that being said I do believe that based on what I am seeing at this point the patient likely does need to take a break from the wound VAC and in fact it appears the wound VAC was actually placed directly on the skin as far as the bridging was concerned causing some irritation of the top of the patient's foot. This has me concerned as well that if were not careful that could worsen. I do believe a break with the wound VAC however will be fine and then we can see where we go from there. MCKENNA, BORUFF ([id number]) 01/07/2020 upon evaluation today patient actually appears to be doing much better in regard to his wound. He has been tolerating the dressing changes without complication. There is no signs of active infection and overall I feel like he is managing quite nicely. I do believe that we would likely discontinue the [facility] altogether today. 10/13; follow-up visit. Patient has a area on the left lateral plantar foot. He has been using  Hydrofera Blue and bordered foam he has home health changing this 3 times a week. He is nonambulatory. He avoids pushing the wheelchair with that part of his foot and instead uses his heel. We have had a nice improvement today and overall surface area 10/27; 2-week follow-up. He has been using Hydrofera Blue and bordered foam on a wound on the left lateral foot. He told me he was nonambulatory last time from foot drop although he walked in the clinic with a walker this week apparently did not have any transportation. Also he has a new wound on the right dorsal medial foot which she says was caused by foot where trauma 11/10; 2-week follow-up. We have been using Hydrofera Blue on the left lateral foot roughly at the base of the fifth metatarsal. He came in last visit with a punched out  area on the right dorsal foot. He says this was foot where trauma. I reiterated that he is not wearing his previous braces and he is in. He spends most of his time in his wheelchair. He says he is careful not to be pushing himself around in the wheelchair with his feet. He is wearing regular tennis shoes. I taken a bit of time to review the history of the wound on the left foot. This actually was a chronic wound for him. Episodic infections. He had arterial studies done in August these were normal. ABI in the right of 1.05, TBI at 0.65 with triphasic waveforms on the left 1.01 with a TBI of 0.78 and again triphasic waveforms. Does not appear to be a arterial issue 11/17 we brought the patient in today to look at the area on the right dorsal foot and the left lateral plantar foot. The left was the original wound. Patient states the right dorsal foot happened from trauma from his brace. He is no longer using this. He is mostly in surgical shoes that we provide except when he goes out driving. We changed him to silver collagen last week. He does not have an arterial issue He came to the clinic today got out of his car went to the back of his car felt presyncopal and fell down. When we got out there to help him up he was alert conversational. Blood pressure little reduced at 601 systolic: We got him in the exam room. Otherwise he looks fine 03/21/2020 upon evaluation today patient appears to be doing decently well in regard to his ulcers on the bilateral feet. Fortunately there is no signs of active infection at this time which is great news. No fever chills noted. He has been tolerating the dressing changes without complication. Fortunately there is no sign of active infection at this time. No fevers, chills, nausea, vomiting, or diarrhea. Electronic Signature(s) Signed: 03/21/2020 2:22:47 PM By: Worthy Keeler [name] Entered By: Worthy Keeler on 03/21/2020 14:22:47 [name]  ([id number]) -------------------------------------------------------------------------------- Physical Exam Details Patient Name: [name] Date of Service: 03/21/2020 1:45 PM Medical Record Number: [id number] Patient Account Number: [id number] Date of Birth/Sex: 06-01-1945 (74 y.o. M) Treating RN: Cornell Barman Primary Care Provider: [name] Other Clinician: Referring Provider: [name] Treating Provider/Extender: Skipper Cliche in Treatment: 44 Constitutional Well-nourished and well-hydrated in no acute distress. Respiratory normal breathing without difficulty. Psychiatric this patient is able to make decisions and demonstrates good insight into disease process. Alert and Oriented x 3. pleasant and cooperative. Notes She has wounds currently did require some sharp debridement to clear with some of  the necrotic tissue over the surface of the wound. He tolerated the dressing changes without complication post debridement the wound bed appears to be doing much better. Especially on the right foot where he did have a little bit more depth. I did attempt to debride this carefully so as not to cause any complications or problems. He does have a lot of really dry ashy skin over the bilateral lower extremities I recommend lotion as well. Electronic Signature(s) Signed: 03/21/2020 2:23:22 PM By: Worthy Keeler [name] Entered By: Worthy Keeler on 03/21/2020 14:23:21 [name] ([id number]) -------------------------------------------------------------------------------- Physician Orders Details Patient Name: [name] Date of Service: 03/21/2020 1:45 PM Medical Record Number: [id number] Patient Account Number: [id number] Date of Birth/Sex: 1945/05/29 (74 y.o. M) Treating RN: Cornell Barman Primary Care Provider: [name] Other Clinician: Referring Provider: [name] Treating Provider/Extender: Skipper Cliche in Treatment: 15 Verbal / Phone  Orders: No Diagnosis Coding ICD-10 Coding Code Description E11.621 Type 2 diabetes mellitus with foot ulcer L97.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scle L97.512 Non-pressure chronic ulcer of other part of right foot with fat layer exposed E11.42 Type 2 diabetes mellitus with diabetic polyneuropathy M21.372 Foot drop, left foot Wound Cleansing Wound #7 Left,Lateral,Plantar Foot o Clean wound with Normal Saline. Wound #9 Right,Dorsal Foot o Clean wound with Normal Saline. Primary Wound Dressing Wound #7 Left,Lateral,Plantar Foot o Silver Collagen - moisten with hydrogel Wound #9 Right,Dorsal Foot o Silver Collagen - moisten with hydrogel Secondary Dressing Wound #7 Left,Lateral,Plantar Foot o Boardered Foam Dressing Wound #9 Right,Dorsal Foot o Boardered Foam Dressing Dressing Change Frequency Wound #7 Left,Lateral,Plantar Foot o Change Dressing Monday, Wednesday, Friday Wound #9 Right,Dorsal Foot o Change Dressing Monday, Wednesday, Friday Follow-up Appointments Wound #7 Left,Lateral,Plantar Foot o Return Appointment in 2 weeks. Wound #9 Right,Dorsal Foot o Return Appointment in 2 weeks. Edema Control Wound #7 Left,Lateral,Plantar Foot o Elevate legs to the level of the heart and pump ankles as often as possible Wound #9 Right,Dorsal Foot o Elevate legs to the level of the heart and pump ankles as often as possible Off-Loading TYRIK, STETZER. ([id number]) Wound #7 Left,Lateral,Plantar Foot o Open toe surgical shoe with peg assist. o Other: - No weight on foot, Wheelchair only Wound #9 Right,Dorsal Foot o Open toe surgical shoe with peg assist. o Other: - No weight on foot, Wheelchair only o Open toe surgical shoe o Other: - No weight on foot, Wheelchair only Additional Orders / Instructions Wound #7 Left,Lateral,Plantar Foot o Increase protein intake. Wound #9 Right,Dorsal Foot o  Increase protein intake. Home Health Wound #7 [place] Visits - [facility]; please see patient on Friday when he does not have a wound care visit. o Please direct any NON-WOUND related issues/requests for orders to patient's Primary Care Physician Wound #9 [place] Visits - [facility]; please see patient on Friday when he does not have a wound care visit. o Please direct any NON-WOUND related issues/requests for orders to patient's Primary Care Physician Electronic Signature(s) Signed: 03/21/2020 4:50:08 PM By: Worthy Keeler [name] Signed: 03/27/2020 5:16:15 PM By: Gretta Cool, BSN, RN, CWS, Kim RN, BSN Entered By: Gretta Cool, BSN, RN, CWS, Kim on 03/21/2020 14:16:35 [name] ([id number]) -------------------------------------------------------------------------------- Problem List Details Patient Name: [name] Date of Service: 03/21/2020 1:45 PM Medical Record Number: [id number] Patient Account Number: [id number] Date of Birth/Sex: 01-23-1946 (74 y.o. 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 12/03/2019 No Yes muscle L97.512 Non-pressure chronic ulcer of other part of right foot with fat layer 02/06/2020 No Yes exposed E11.42 Type 2 diabetes mellitus with diabetic polyneuropathy 12/03/2019 No Yes M21.372 Foot drop, left foot 12/03/2019 No Yes Inactive Problems Resolved Problems Electronic Signature(s) Signed: 03/21/2020 1:52:59 PM By: Worthy Keeler [name] Entered By: Worthy Keeler on 03/21/2020 13:52:58 [name]  ([id number]) -------------------------------------------------------------------------------- Progress Note Details Patient Name: [name] Date of Service: 03/21/2020 1:45 PM Medical Record Number: [id number] Patient Account Number: [id number] Date of Birth/Sex: 12/18/1945 (74 y.o. M) Treating RN: Cornell Barman Primary Care Provider: [name] Other Clinician: Referring Provider: [name] Treating Provider/Extender: Skipper Cliche in Treatment: 15 Subjective Chief Complaint Information obtained from Patient Left plantar foot ulcer s/p surgical debridement and wound VAC placement. Left great toe ulcer. History of Present Illness (HPI) 10/04/16 on evaluation today patient presents for initial evaluation concerning a laceration of the right wrist and hand on the bowler aspect. He tells me that he fell into glass following a medication change in regard to his diabetes. He initially went to [facility] where she was sutured and subsequently his daughter who is a Marine scientist removed the teachers at the appropriate time. Unfortunately the wound has done poorly and dehissed which has caused him some trouble including infection. He has been on antibiotics both topically and orally though the wound continues to show signs of necrotic tissue according to notes which is what he was sent to [place] for evaluation concerning. He does have diabetes and unfortunately this is severely uncontrolled. He does see an endocrinologist and is working on this. 10/11/2016 -- the patient's notes from the ER were reviewed and he had his suturing done on 09/17/2016 and had necrotic skin flaps which had to be trimmed the last visit he was here. He has managed to get his Santyl ointment and has been using this appropriately. 10/21/16 patient's wound appears to be doing very well on evaluation today. He still has some Slough covering the wound bed but this is nowhere near as severe as when i saw this initially  two weeks ago nor even my review of his pictures last week. I'm pleased with how this is progressing. Patient's wound over the right form appears to be doing fairly well on evaluation today. There is no evidence of infection and he has a small area at the crease of where she is wrist extends and flexes that is still open and appears to be having difficulty due to the fact that it is cracking open. I think this is something that may be controlled with moisture control that is keeping the wound a little bit more moist. Nonetheless this is a tiny region compared to the initial wound and overall appears to be doing very well. No fevers, chills, nausea, or vomiting noted at this time. 11/25/16 on evaluation today patient appears to be doing well in regard to his right wrist ulcer. In fact this appears to be completely healed he is having no discomfort. He is pleased with how this is progressed. Readmission: 06/24/17 on evaluation today patient appears back in our clinic regarding issues that he has been having  with his right ankle on the lateral malleolus as well is in the dorsal surface of his left foot. Both have been going on for several weeks unfortunately. He tells me that initially this was being managed by Dr. Edilia Bo and I did review the notes from Dr. Edilia Bo in epic. Patient was placed on amoxicillin initially and then subsequently Keflex following. He has been using triple antibiotic ointment over-the-counter along with a Band-Aid for the wound currently. He did not have any Santyl remaining. His most recent hemoglobin A1c which was just two days ago was 10.3. Fortunately he seems to have excellent blood flow in the bilateral lower extremities with his left ABI being 0.95 in the right ABI 1.01. Patient does have some pain in regard to each of these ulcerations but he tells me it's not nearly as severe as the hand wound that I help treat earlier over the summer. This was in 2018. 07/15/17 on  evaluation today patient's lower surety ulcer is actually both appear to be doing fairly well at this point. He does have a little bit of skin overlapping the ulcer on the right lateral ankle this was debrided away today just with saline and gauze without complication patient had no discomfort or pain. Otherwise patient's left foot ulcer does appear to be showing signs of improvement he does seem to have a right great toe. Nikki and noted at this point in time. I think that this does need to be managed at this point. No fevers, chills, nausea, or vomiting noted at this time. 07/22/17 on evaluation today patient's wounds in regard to his lower extremities appear to be doing somewhat better. His right ankle wound in fact appears to likely be completely closed. The left dorsal foot ulcer though still open does appear to be a little bit smaller and does also seem to be making good progress. He has not been having problems with the treatment at this point in the general he does not seem to show as much erythema surrounding the wound bed which is good news. He is not having significant discomfort. 08/12/17 on evaluation today patients wounds of both appeared to be doing better in fact they both appear to be completely healed. Overall I'm very pleased with how things have progressed over the past week even more than what I expected. Obviously this is good news and he is happy about this. He did have a fall when going to his orthopedic doctor recently fortunately he does not appear to have injured anything severely but nonetheless does have a couple of bumps and bruises nothing significant at this time but he needs to be seen and wound care for. He may have broken his left wrist he is following up with orthopedics in that regard. Readmission: 10/24/17 on evaluation today patient presents for reevaluation after having been discharged Aug 12, 2017. His wound was healed at that point although he states that it has  been somewhat discolored since that time. He has not experienced the drainage as far as the wound is concerned but the fact that it was still discolored been greater than two months after the fact made him want to come in for reevaluation. Upon inspection today the patient has no pain although he does have neuropathy. He does have a discolored region of the dorsal surface of the left foot which was the site where the ulcer was that we previously treated. The good news is he does not again have any discomfort although obviously I  do believe this may be some scar tissue and just discoloration in that regard. Again he hasn't had any drainage or discharge since I last saw him and at this point I see no evidence of that either this area shows complete epithelialization. In general I do not feel like there's any significant issue at this point in that regard. PEARLY, APACHITO ([id number]) 02/09/2019 This is a 74 year old man with type 2 diabetes poorly controlled with significant peripheral neuropathy. He has been in this clinic before in 2018 with a laceration on his right hand and then again in 2019 with an area over the right lateral malleolus. 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 He states that the wound is been there for the last week or 2. It is been uncomfortable to walk on. The man is an active man having to look after an elderly mother. Past medical history, type 2 diabetes with polyneuropathy with a recent hemoglobin A1c in September 2011 0.3, lumbar disc disease, hypergonadism., Left foot drop and gout. We did not repeat his ABIs in the clinic quoting values from July 2019 of 0.95 and 1.01. He is not felt to have significant PAD 02/19/2019 on evaluation today patient actually appears to be doing better with regard to his foot wound based on what I am seeing currently. He was seen by Dr. Dellia Nims on  readmission back into the clinic last week when I was off. The good news is the patient has been tolerating the dressing changes without any complication in fact has not really had much in the way of drainage. He does have an AFO brace which she believes is actually pushing on this area as well and has contributed to the issue. Fortunately there is no signs of active infection at this point. His ABIs appear to be well. 03/05/2019 on evaluation today patient appears to be doing well in regard to his foot ulcer. He has been tolerating the dressing changes without complication. Fortunately there is no signs of active infection. The wound is measuring somewhat smaller and overall I feel like he is making good progress at this point. 03/12/2019 on evaluation today patient actually appears to be doing quite well with regard to his foot ulcer. The wound is looking better the measurement is not significantly better at this point unfortunately but nonetheless again the overall appearance is improving I think he is making some progress. I still think the big issue here is foot drop and again the patient wants to see if potentially he could get a brace to help with this. Subsequently I think that we can definitely make a referral to [facility] to see if they can help [place] in this regard. He is in agreement with that plan. 03/26/2019 on evaluation today patient appears to be doing more poorly in regard to his foot ulcer at this time. He did see podiatry in order to see about getting a brace but unfortunately he tells me that Medicare is not likely to pay for one for him as he had 1 I guess 8 months ago. However this one that apparently goes in his shoes that does not seem to be working out well for him. Unfortunately there are signs of active infection at this time. No fevers, chills, nausea, vomiting, or diarrhea. Systemically. The patient has erythema on the dorsal surface of his foot that tracks from the lateral  portion of his foot and again he does have a blister around the wound as well which is worse compared to previous. Overall I am concerned. 04/02/2019 on evaluation today patient appears to be doing about the same with regard to his wound. Unfortunately there is no signs of significant improvement although I do think there is no signs of infection which is good news. His drainage is also slowed down tremendously. I think at this point he would benefit from the initiation of a total contact cast. The only concern I have is that over his balance. I explained to the patient that if we were to initiate the cast I think it would be greatly beneficial for him but he would need to ensure not to walk at all unless he uses a walker he actually has 2 walkers one for inside his home and one that he keeps out in the car. For that reason he would be covered in that regard. I just do not want him to have any accidents and fall with the cast. 12/28-Patient comes in 1 week out a day early for his appointment he was in the hospital for 2 days for uncontrolled hyperglycemia and required management of high blood sugars, with some adjustments in his home regimen of insulin, patient stubbed his right great toe today and put a Band-Aid on it, he is here for his left lateral foot ulcer and we are using silver alginate dressing, he was intact total contact cast with difficulty with balance to the extent that that had to be discontinued 04/16/2019 on evaluation today patient appears to be doing better with regard to the overall appearance of his wound size. This is good news. We had attempted a total contact cast but unfortunately he had some falling issues that the family contacted [place] about we took the cast off we did not put it back on. Nonetheless it ended up that just a couple days later he was in the hospital due to elevations in his blood sugar which went to around 500 and even a little higher. Subsequently he was admitted  to the hospital and was in the hospital for a total of 3 days and that included Christmas day. Fortunately there got his blood sugar under control he tells me that this is running about 200 now which is much better news. There does not appear to be any signs of active infection at this time. No fevers, chills, nausea, vomiting, or diarrhea. 04/30/2019 on evaluation today patient actually appears to be doing slightly worse compared to previous as far as the overall size of his wound is concerned. There also is some need for sharp debridement today as well. Fortunately there is no evidence of active infection which is good news. No fevers, chills, nausea, vomiting, or diarrhea. 05/07/2019 upon evaluation today patient appears to be doing excellent in regard to his plantar foot ulcer. He has been tolerating the dressing changes without complication. Fortunately there is no signs of active infection at this time. No fevers, chills, nausea, vomiting, or diarrhea. He does tell me that he actually got in touch with [employer] and they feel like they can add diagnosis codes to his orders in order to get an external brace for him as opposed to the one that goes internal to issue. That would be excellent for his foot drop and I did tell him that if there is anything I need to fill out in that regard I will be more than happy to oblige  and fill out what is needed for [employer]. 05/14/2019 upon evaluation today patient's original wound site actually appears to be doing much better which is good news. Fortunately there is no signs of active infection at this time. No fevers, chills, nausea, vomiting, or diarrhea. With that being said the patient unfortunately is continuing to have issues with rubbing he tells me that the felt slid on his foot although he did not realize it until today. Nonetheless I think this did cause a blister around the edge of the wound and he does an open wound that is slightly larger overall in  measurement compared to last time mainly due to this blister. Other than that I feel like he is doing quite well. 05/21/2019 upon evaluation today patient appears to be doing excellent in regard to his wound at this time on the plantar foot. He is showing signs of improvement he does have some slough and biofilm noted on the surface of the wound this is going require some sharp debridement today as well. Fortunately there is no evidence of active infection. No fevers, chills, nausea, vomiting, or diarrhea. 05/28/2019 on evaluation today patient's wound actually appears to be measuring smaller upon initial inspection and I do believe that it is doing well. With that being said he continues to have a blister on the outside/lateral edge of the wound that occurs as a result of rubbing due to his dropfoot currently. Subsequently we have filled out the paperwork for his new external brace but again that does take several weeks to get that completed once everything is returned to back we did send this back last week as far as the paperwork was concerned. With that being said there does not appear to be any signs of active infection at this time. 06/04/2019 on evaluation today patient's wound actually appears to be doing slightly better compared to last week which is good news. He still JIBREEL, FEDEWA ([phone]) states that his insurance unfortunately does not seem to want to cover any of his new brace. He is going to just pay for this himself. Nonetheless he is going by there today when he leaves here that is [employer] in [place] that is going to be making this brace for him. 06/18/2019 upon evaluation today patient fortunately does have his new brace which is the external AFO brace with bilateral uprights. Subsequently he seems to be doing much better in fact the wound already appears better. He has not even been using the felt he is just using a dressing over top of the wound and overall I am very pleased  with how things seem to be progressing. There is no signs of active infection at this time which is great news and I think that the AFO is good to help him tremendously as far as trying to keep pressure off of the 06/25/2019 upon evaluation today patient seems to be making good progress in regard to his wound. He has been tolerating the dressing changes without complication. Fortunately there is no signs of active infection at this time. No fevers, chills, nausea, vomiting, or diarrhea. 07/02/2019 upon evaluation today patient unfortunately had a blistered area on the bottom of his foot tracking more towards the midline of the plantar aspect. He does not seem to have issues with rubbing more laterally that he used to have but nonetheless this is still giving him some trouble and causing problems here. Fortunately there is no signs of active infection at this time. No fevers, chills,  nausea, vomiting, or diarrhea. 07/09/2019 upon evaluation today patient appears to be doing well with regard to his plantar foot ulcer. Definitely this is much improved compared to last week's visit which again last week the issue there was that he had an area of callus buildup on the plantar aspect of his foot I was able to clean this away and after effectively doing so the wound seems to be doing much better which is great news. There is no signs of active infection at this time. 07/16/2019 upon evaluation today patient appears to be making some progress in my opinion with regard to his plantar foot ulcer. He has been tolerating the dressing changes without complication. Fortunately there is no evidence of active infection at this time which is great news. 07/23/2019 upon evaluation today patient appears to be doing well with regard to his wounds on the plantar foot. He has been tolerating the dressing changes without complication. These do seem to be healing. 08/06/2019 upon evaluation today patient's wound actually appears to  be showing some signs of improvement though he still has a significant amount of callus buildup over this location. That is can require some debridement today. With that being said the actual wound openings seem to be dramatically improved which is great news. There is no signs of active infection at this time. No fevers, chills, nausea, vomiting, or diarrhea. 08/13/2019 upon evaluation today patient appears to be doing well with regard to his foot ulcer. He has been tolerating the dressing changes without complication. Fortunately there is no signs of active infection at this time. No fevers, chills, nausea, vomiting, or diarrhea. 08/20/2019 upon evaluation today patient appears to be doing excellent in regard to his foot ulcer. I do believe he still showing signs of improvement this is very slow but again I think still pressure getting to the area even with his brace and custom shoe is still part of the main issue with the delay here. Nonetheless I do not see any signs of worsening or infection all of which is good news. 5/20; left lateral foot wound. He wears an AFO brace for foot drop related to his diabetes. The wound is small and clean looking he is using silver collagen. We have apparently tried him in offloading shoe he could not tolerate this. He does not have good balance. 09/11/2019 upon evaluation today patient appears to be doing more poorly in regard to his foot ulcer as compared to last time I saw him. Unfortunately he does tell me as we discussed on Friday that he had pus and blood coming from the area underneath the callus. Unfortunately I think that this is a combination of the callus not being trimmed away enough and potentially the felt pad causing an abnormal rubbing which has happened before which led to this becoming more irritated and likely infected based on what I am seeing today. The patient takes ampicillin all the time. Subsequently he also states that he has been trying for a  couple of days to take the clindamycin of which she had some leftover from previous. With that being said it upset his stomach. The antibiotic that I sent in for him on Friday unfortunately I wrote the wrong number on as far as the number of pills that he needed. With that being said I corrected that this morning but he tells me is not can pick it up as that seems to be making him have trouble with his stomach being upset at this  point. Fortunately there is no signs of active infection at this time systemically. 09/17/2019 upon evaluation today patient appears to be doing better in regard to his foot ulcer. Has been taking ampicillin that he had leftover from a previous prescription. With that being said he did go see his doctor today concerning his diabetes and notes that his hemoglobin A1c was 13 obviously this is elevated and there can be working on that. Also did review the culture result that we finally got back for final well that showed rare Pseudomonas and a few group B strep noted. The Pseudomonas was sensitive to Cipro but again the wound is doing so much better with the ampicillin I think this is probably not a causative organism I do not see anything that really has me concerned as far as infection is concerned today. Therefore I would hold off on prescribing Cipro for the patient and have him discontinue the ampicillin until completion. 09/24/2019 upon evaluation today patient appears to be doing 09/24/2019 upon evaluation today patient appears to be doing well with regard to his foot ulcer. He actually has not enlarged any and seems to be measuring smaller quite significantly at this point. His toe ulcer is also doing somewhat better which is good news. Fortunately there is no evidence of active infection at this time. No fevers, chills, nausea, vomiting, or diarrhea. 10/08/2019 upon evaluation today patient appears to be doing well with regard to his foot ulcer all things considered.  Fortunately there is no signs of active infection at this time. No fevers, chills, nausea, vomiting, or diarrhea. Fortunately he states that overall he has been doing much better with regard to not have any blistering on the side of his foot. Unfortunately he did have a fall last night due to the fact that he got up to walk without his brace on and did not have his walker either it was around 4 in the morning and his mother was calling out. Subsequently he states that the foot drop got him and he ended up following. Nonetheless he states he normally uses his walker I explained he needs to try to make sure not to ever go without the walker. No matter how attempting the situation may be. Readmission: 12/03/2019 upon evaluation today patient presents for follow-up after having had surgery last week on his foot for surgical debridement and then subsequent wound VAC placement. He had gone into the hospital as he was having a significant infection and not feeling well. He is unfortunately since I last saw him also been under the care of the cancer doctors and they have been taking good care of him. He does seem to be in fairly high spirits all things considered. With that being said he has lung cancer he tells me that his back from the radiation feels like it is constantly burned. 12/10/2019 on evaluation today patient appears to be doing well with regard to his wound. I do believe the wound VAC is doing a great job at helping to manage his drainage and overall health and to granulate in the wound. He has less undermining noted today in fact almost none. Home health is coming out to change the wound VAC on a regular basis Monday, Wednesday, and Friday. 12/24/2019 on evaluation today patient appears to be doing fairly well in regard to the wound on the plantar aspect of his foot. With that being said he does have a significant feeling in of what has been going on with regard  to the ulceration which does not  have the depth that it did in the [place], BASTIEN STRAWSER. ([id number]) past. With that being said I do believe that based on what I am seeing at this point the patient likely does need to take a break from the wound VAC and in fact it appears the wound VAC was actually placed directly on the skin as far as the bridging was concerned causing some irritation of the top of the patient's foot. This has me concerned as well that if were not careful that could worsen. I do believe a break with the wound VAC however will be fine and then we can see where we go from there. 01/07/2020 upon evaluation today patient actually appears to be doing much better in regard to his wound. He has been tolerating the dressing changes without complication. There is no signs of active infection and overall I feel like he is managing quite nicely. I do believe that we would likely discontinue the [facility] altogether today. 10/13; follow-up visit. Patient has a area on the left lateral plantar foot. He has been using Hydrofera Blue and bordered foam he has home health changing this 3 times a week. He is nonambulatory. He avoids pushing the wheelchair with that part of his foot and instead uses his heel. We have had a nice improvement today and overall surface area 10/27; 2-week follow-up. He has been using Hydrofera Blue and bordered foam on a wound on the left lateral foot. He told me he was nonambulatory last time from foot drop although he walked in the clinic with a walker this week apparently did not have any transportation. Also he has a new wound on the right dorsal medial foot which she says was caused by foot where trauma 11/10; 2-week follow-up. We have been using Hydrofera Blue on the left lateral foot roughly at the base of the fifth metatarsal. He came in last visit with a punched out area on the right dorsal foot. He says this was foot where trauma. I reiterated that he is not wearing his previous braces and he is in. He  spends most of his time in his wheelchair. He says he is careful not to be pushing himself around in the wheelchair with his feet. He is wearing regular tennis shoes. I taken a bit of time to review the history of the wound on the left foot. This actually was a chronic wound for him. Episodic infections. He had arterial studies done in August these were normal. ABI in the right of 1.05, TBI at 0.65 with triphasic waveforms on the left 1.01 with a TBI of 0.78 and again triphasic waveforms. Does not appear to be a arterial issue 11/17 we brought the patient in today to look at the area on the right dorsal foot and the left lateral plantar foot. The left was the original wound. Patient states the right dorsal foot happened from trauma from his brace. He is no longer using this. He is mostly in surgical shoes that we provide except when he goes out driving. We changed him to silver collagen last week. He does not have an arterial issue He came to the clinic today got out of his car went to the back of his car felt presyncopal and fell down. When we got out there to help him up he was alert conversational. Blood pressure little reduced at 081 systolic: We got him in the exam room. Otherwise he looks fine 03/21/2020 upon evaluation  today patient appears to be doing decently well in regard to his ulcers on the bilateral feet. Fortunately there is no signs of active infection at this time which is great news. No fever chills noted. He has been tolerating the dressing changes without complication. Fortunately there is no sign of active infection at this time. No fevers, chills, nausea, vomiting, or diarrhea. Objective Constitutional Well-nourished and well-hydrated in no acute distress. Vitals Time Taken: 1:56 PM, Height: 71 in, Weight: 190 lbs, BMI: 26.5, Temperature: 97.7 F, Pulse: 87 bpm, Respiratory Rate: 18 breaths/min, Blood Pressure: 90/58 mmHg. Respiratory normal breathing without  difficulty. Psychiatric this patient is able to make decisions and demonstrates good insight into disease process. Alert and Oriented x 3. pleasant and cooperative. General Notes: She has wounds currently did require some sharp debridement to clear with some of the necrotic tissue over the surface of the wound. He tolerated the dressing changes without complication post debridement the wound bed appears to be doing much better. Especially on the right foot where he did have a little bit more depth. I did attempt to debride this carefully so as not to cause any complications or problems. He does have a lot of really dry ashy skin over the bilateral lower extremities I recommend lotion as well. Integumentary (Hair, Skin) Wound #7 status is Open. Original cause of wound was Surgical Injury. The wound is located on the [place]. The wound measures 0.5cm length x 1cm width x 0.2cm depth; 0.393cm^2 area and 0.079cm^3 volume. There is Fat Layer (Subcutaneous Tissue) exposed. There is a medium amount of serosanguineous drainage noted. The wound margin is thickened. There is small (1-33%) red, pink, hyper - granulation within the wound bed. There is a small (1-33%) amount of necrotic tissue within the wound bed including Adherent Slough. Wound #9 status is Open. Original cause of wound was Pressure Injury. The wound is located on the Right,Dorsal Foot. The wound measures 1cm length x 0.9cm width x 0.2cm depth; 0.707cm^2 area and 0.141cm^3 volume. There is Fat Layer (Subcutaneous Tissue) exposed. There is a medium amount of serosanguineous drainage noted. The wound margin is flat and intact. There is no granulation within the wound bed. There is a large (67-100%) amount of necrotic tissue within the wound bed including Adherent Slough. 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 There was a Excisional Skin/Subcutaneous Tissue Debridement with a total area of 0.5 sq cm performed by Tommie Sams., [name]. With the following instrument(s): Curette to remove Viable and Non-Viable tissue/material. Material removed includes Callus, Subcutaneous Tissue, and Slough. A time out was conducted at 14:10, prior to the start of the procedure. A Moderate amount of bleeding was controlled with Pressure. The procedure was tolerated well. Post Debridement Measurements: 0.5cm length x 1cm width x 0.3cm depth; 0.118cm^3 volume. Character of Wound/Ulcer Post Debridement is stable. Severity of Tissue Post Debridement is: Fat layer exposed. Post procedure Diagnosis Wound #7: Same as Pre-Procedure Wound #9 Pre-procedure diagnosis of Wound #9 is a Diabetic Wound/Ulcer of the Lower Extremity located on the Right,Dorsal  Foot .Severity of Tissue Pre Debridement is: Limited to breakdown of skin. There was a Excisional Skin/Subcutaneous Tissue Debridement with a total area of 0.9 sq cm performed by Tommie Sams., [name]. With the following instrument(s): Curette to remove Viable and Non-Viable tissue/material. Material removed includes Subcutaneous Tissue and Slough and. No specimens were taken. A time out was conducted at 14:10, prior to the start of the procedure. A Moderate amount of bleeding was controlled with Pressure. The procedure was tolerated well. Post Debridement Measurements: 1cm length x 0.9cm width x 0.4cm depth; 0.283cm^3 volume. Character of Wound/Ulcer Post Debridement is stable. Severity of Tissue Post  Debridement is: Fat layer exposed. Post procedure Diagnosis Wound #9: Same as Pre-Procedure Plan Wound Cleansing: Wound #7 Left,Lateral,Plantar Foot: Clean wound with Normal Saline. Wound #9 Right,Dorsal Foot: Clean wound with Normal Saline. Primary Wound Dressing: Wound #7 Left,Lateral,Plantar Foot: Silver Collagen - moisten with hydrogel Wound #9 Right,Dorsal Foot: Silver Collagen - moisten with hydrogel Secondary Dressing: Wound #7 Left,Lateral,Plantar Foot: Boardered Foam Dressing Wound #9 Right,Dorsal Foot: Boardered Foam Dressing Dressing Change Frequency: Wound #7 Left,Lateral,Plantar Foot: Change Dressing Monday, Wednesday, Friday Wound #9 Right,Dorsal Foot: Change Dressing Monday, Wednesday, Friday Follow-up Appointments: Wound #7 Left,Lateral,Plantar Foot: Return Appointment in 2 weeks. Wound #9 Right,Dorsal Foot: Return Appointment in 2 weeks. Edema Control: Wound #7 Left,Lateral,Plantar Foot: Elevate legs to the level of the heart and pump ankles as often as possible Wound #9 Right,Dorsal Foot: CLEON, SIGNORELLI. ([id number]) Elevate legs to the level of the heart and pump ankles as often as possible Off-Loading: Wound #7 Left,Lateral,Plantar Foot: Open toe surgical shoe with peg assist. Other: - No weight on foot, Wheelchair only Wound #9 Right,Dorsal Foot: Open toe surgical shoe with peg assist. Other: - No weight on foot, Wheelchair only Open toe surgical shoe Other: - No weight on foot, Wheelchair only Additional Orders / Instructions: Wound #7 Left,Lateral,Plantar Foot: Increase protein intake. Wound #9 Right,Dorsal Foot: Increase protein intake. Home Health: Wound #7 Left,Lateral,Plantar Foot: Continue Home Health Visits - [facility]; please see patient on Friday when he does not have a wound care visit. Please direct any NON-WOUND related issues/requests for orders to patient's Primary Care Physician Wound #9 Right,Dorsal Foot: [place]  Visits - [facility]; please see patient on Friday when he does not have a wound care visit. Please direct any NON-WOUND related issues/requests for orders to patient's Primary Care Physician 1. Would recommend currently that the patient continue with the wound care measures as before specifically with regard to the collagen I think that is doing a great job for him. 2. I am also can recommend currently that the patient continue to try to attempt offloading is much as possible especially in regard to the left foot to try to keep pressure off of this wound location. 3. I am also can recommend the patient continue to monitor for any evidence of infection right now I do not see anything that seems to be problematic obviously we will keep a close eye on things. Follow-up 1 we will see patient back for reevaluation in 1 week here in the clinic. If anything worsens or changes patient will contact our office for additional recommendations. Electronic Signature(s) Signed: 03/21/2020 2:24:34 PM By: Worthy Keeler [name] Entered By: Worthy Keeler on 03/21/2020 14:24:34 [name] ([id number]) -------------------------------------------------------------------------------- SuperBill Details Patient Name: [name] Date of Service: 03/21/2020 Medical Record Number: [id number] Patient Account Number: [id number] Date of Birth/Sex: [birth year] (74 y.o. M) Treating RN: Gretta Cool,  [place] Primary Care Provider: [name] Other Clinician: Referring Provider: [name] Treating Provider/Extender: Skipper Cliche in Treatment: 15 Diagnosis Coding ICD-10 Codes Code Description E11.621 Type 2 diabetes mellitus with foot ulcer L97.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scle L97.512 Non-pressure chronic ulcer of other part of right foot with fat layer exposed E11.42 Type 2 diabetes mellitus with diabetic polyneuropathy M21.372 Foot drop, left  foot Facility Procedures CPT4 Code: [id number] Description: 14103 - DEB SUBQ TISSUE 20 SQ CM/< Modifier: Quantity: 1 CPT4 Code: Description: ICD-10 Diagnosis Description L97.523 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 m L97.512 Non-pressure chronic ulcer of other part of right foot with fat layer ex Modifier: uscle posed Quantity: Physician Procedures CPT4 Code: [id number] Description: 11042 - WC PHYS SUBQ TISS 20 SQ CM Modifier: Quantity: 1 CPT4 Code: Description: ICD-10 Diagnosis Description L97.523 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 m L97.512 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

## 2020-03-21 NOTE — Chronic Care Management (AMB) (Addendum)
Chronic Care Management Pharmacy Assistant   Name: RAYN ENDERSON  [name]: [id number] DOB: 29-Apr-1945  Reason for Encounter: Disease State  Patient Questions:  1.  Have you seen any other providers since your last visit? Yes 03/24/20- ED due to pulse of 160 03/19/20 Malka So, [place]- Cardiology 03/11/20 ED visit- AFIB with rapid ventricular response 03/03/20- Curt Bears, MD- Oncology 03/03/20 - [name], MD PCP- Discontinued atenolol and lisinopril. 02/27/20- Linton Ham, MD- Wound care-   2.  Any changes in your medicines or health?  Yes 03/19/20 Clint Fenton decreased metoprolol from 75 mg to 50 mg, 03/03/20 - [name], MD PCP- Discontinued atenolol and lisinopril.   PCP : [name], MD  Allergies:   Allergies  Allergen Reactions   Tetracycline Itching and Rash    Medications: Outpatient Encounter Medications as of 03/21/2020  Medication Sig Note   acetaminophen (QC ACETAMINOPHEN 8HR ARTH PAIN) 650 MG CR tablet Take 650 mg by mouth every 8 (eight) hours as needed for pain.    albuterol (VENTOLIN HFA) 108 (90 Base) MCG/ACT inhaler Inhale 1-2 puffs into the lungs every 6 (six) hours as needed for wheezing or shortness of breath.    ampicillin (PRINCIPEN) 500 MG capsule Take 500 mg by mouth daily as needed (flare up).     Ascorbic Acid (VITAMIN C) 1000 MG tablet Take 1,000 mg by mouth daily.    cholecalciferol (VITAMIN D) 1000 UNITS tablet Take 2,000 Units by mouth daily.     donepezil (ARICEPT) 10 MG tablet TAKE 1 TABLET BY MOUTH AT BEDTIME    fluticasone (FLONASE) 50 MCG/ACT nasal spray PLACE 2 SPRAYS IN EACH NOSTRIL DAILY    Hyprom-Naphaz-Polysorb-Zn Sulf (CLEAR EYES COMPLETE OP) Place 1 drop into both eyes daily as needed (dry eyes).    Insulin Pen Needle (PEN NEEDLES) 31G X 5 MM MISC 1 each by Does not apply route 3 (three) times daily. To inject insulin E11.42    insulin regular human CONCENTRATED (HUMULIN R U-500 KWIKPEN) 500 UNIT/ML kwikpen  Inject 40 Units into the skin with breakfast, with lunch, and with evening meal. 03/11/2020: Took 50 units today 03-11-20   memantine (NAMENDA) 10 MG tablet TAKE 1 TABLET BY MOUTH TWICE A DAY    metoprolol succinate (TOPROL-XL) 25 MG 24 hr tablet Take 2 tablets (50 mg total) by mouth daily for 30 doses.    montelukast (SINGULAIR) 10 MG tablet Take 1 tablet (10 mg total) by mouth at bedtime.    pantoprazole (PROTONIX) 20 MG tablet Take 1 tablet (20 mg total) by mouth daily.    prochlorperazine (COMPAZINE) 10 MG tablet Take 1 tablet (10 mg total) by mouth every 6 (six) hours as needed.    Semaglutide (OZEMPIC, 0.25 OR 0.5 MG/DOSE, [place]) Inject into the skin.    simvastatin (ZOCOR) 40 MG tablet TAKE 1 TABLET BY MOUTH AT BEDTIME    venlafaxine XR (EFFEXOR-XR) 75 MG 24 hr capsule TAKE 1 CAPSULE BY MOUTH EVERY MORNING AND 2 CAPSULES AT BEDTIME    No facility-administered encounter medications on file as of 03/21/2020.    Current Diagnosis: Patient Active Problem List   Diagnosis Date Noted   Secondary hypercoagulable state ([place]) 03/19/2020   Typical atrial flutter ([place]) 03/11/2020   Antineoplastic chemotherapy induced pancytopenia ([place]) 03/11/2020   Community acquired pneumonia of right lower lobe of lung 02/22/2020   Severe protein-calorie malnutrition ([place]) 02/12/2020   Chemotherapy induced neutropenia ([facility])    Severe sepsis ([place]) 11/22/2019   Neutropenic  fever ([name]) 11/22/2019   Non-small cell carcinoma of right lung, stage 3 ([place]) 10/23/2019   Goals of care, counseling/discussion 10/23/2019   Encounter for antineoplastic chemotherapy 10/23/2019   Small cell lung cancer ([place]) 10/12/2019   Dropfoot 03/15/2019   Mild cognitive impairment 05/08/2018   Esophageal dysphagia    Diabetic foot ulcer ([place]) 06/10/2017   Uncontrolled type 2 diabetes mellitus with peripheral neuropathy ([place]) 05/11/2016   Erectile dysfunction associated with type 2 diabetes mellitus ([place]) 05/28/2015   Former very  heavy cigarette smoker (more than 40 per day) 10/07/2014   Chronic kidney disease, stage III (moderate) ([place]) 12/19/2012   Hypogonadism male 06/14/2012   OSA (obstructive sleep apnea) 03/09/2011   Diabetic neuropathy ([place]) 12/29/2010   Family history of MI (myocardial infarction) 09/16/2010   HIP REPLACEMENT, RIGHT, HX OF 02/05/2010   Hypertriglyceridemia 06/13/2008   GOUT 06/13/2008   Major depressive disorder, recurrent episode, in partial remission ([place]) 06/13/2008   Essential hypertension 06/13/2008   GERD 06/13/2008   OSTEOARTHRITIS 06/13/2008   Recent Relevant Labs: Lab Results  Component Value Date/Time   HGBA1C 8.7 (H) 03/11/2020 09:53 PM   HGBA1C 10.2 (H) 11/23/2019 02:33 AM   MICROALBUR 57.6 (H) 01/03/2019 11:25 AM   MICROALBUR 34.0 (H) 12/29/2017 09:14 AM    Kidney Function Lab Results  Component Value Date/Time   CREATININE 1.54 (H) 03/18/2020 08:53 AM   CREATININE 1.26 (H) 03/15/2020 06:06 AM   CREATININE 1.21 03/14/2020 04:43 AM   CREATININE 1.30 (H) 03/11/2020 10:04 AM   CREATININE 1.26 11/16/2011 05:13 AM   GFR 45.06 (L) 10/10/2019 12:21 PM   GFRNONAA 47 (L) 03/18/2020 08:53 AM   GFRNONAA 59 (L) 11/16/2011 05:13 AM   GFRAA >60 01/14/2020 12:00 PM   GFRAA >60 11/16/2011 05:13 AM   03/21/20 [facility]  03/25/20- Patient currently in hospital due to "heart trouble / pulse of 160"- told him I would call back next week.   Current antihyperglycemic regimen:  Humulin R U-500 - 40 units 30 minutes before meals TID (may increase by 5 units if sugars are still high after starting Ozempic and also in the days of Chemotherapy if you take Dexamethasone) Ozempic 0.5 mg - Inject once weekly  Freestyle Libre CGM   What recent interventions/DTPs have been made to improve glycemic control:  Improving diet  Have there been any recent hospitalizations or ED visits since last visit with CPP? Yes   03/24/20 - Tachycardia   Patient denies hypoglycemic symptoms, including Pale,  Sweaty, Shaky, Hungry, Nervous/irritable and Vision changes states he was woken up 2 nights this past week feeling nauseated BGL was 50. States he drank some orange juice and ate some crackers which seemed to help  Patient denies hyperglycemic symptoms, including blurry vision, excessive thirst, fatigue, polyuria and weakness   How often are you checking your blood sugar? 3-4 times daily does not write down readings and "does not know how to log on to phone to retrieve readings".  What are your blood sugars ranging?  Fasting: 04/01/20 - 104 Before meals: N/A After meals: N/A Bedtime:  03/31/20 - 180 03/28/20- 50 - woke him up - nauseated 03/27/20- 50 woke him up - nauseated  During the week, how often does your blood glucose drop below 70? Twice a week   Are you checking your feet daily/regularly?  States sores and wounds are clearing up on his feet. He is seeing wound care and home health is coming out to his home.   Adherence Review:  Is the patient currently on a STATIN medication? Yes Is the patient currently on ACE/ARB medication? No Does the patient have >5 day gap between last estimated fill dates? No  Patient states he is doing okay. He states he has low blood sugar a couple of times during the week and will drink orange juice and eat crackers which seem to help him. Inquired about the price of Ozempic and if the cost was a hindrance in any way. Patient denied cost being an issue.     Follow-Up:  Pharmacist Review   Debbora Dus, CPP notified  Margaretmary Dys, [place] Assistant [phone]   Patient would benefit from DM follow up. Will coordinate with primary pharmacy team.  De [place]anch, PharmD, [facility] Clinical Pharmacist

## 2020-03-24 ENCOUNTER — Ambulatory Visit (INDEPENDENT_AMBULATORY_CARE_PROVIDER_SITE_OTHER): Payer: Medicare Other | Admitting: Family Medicine

## 2020-03-24 ENCOUNTER — Emergency Department (HOSPITAL_COMMUNITY): Payer: Medicare Other

## 2020-03-24 ENCOUNTER — Other Ambulatory Visit: Payer: Self-pay

## 2020-03-24 ENCOUNTER — Inpatient Hospital Stay (HOSPITAL_COMMUNITY)
Admission: EM | Admit: 2020-03-24 | Discharge: 2020-03-27 | DRG: 308 | Disposition: A | Discharge status: Home / Self Care | Payer: Medicare Other | Attending: Internal Medicine | Admitting: Internal Medicine

## 2020-03-24 ENCOUNTER — Ambulatory Visit (HOSPITAL_COMMUNITY): Payer: Medicare Other | Admitting: Physician Assistant

## 2020-03-24 ENCOUNTER — Encounter: Payer: Self-pay | Admitting: Family Medicine

## 2020-03-24 ENCOUNTER — Telehealth: Payer: Self-pay

## 2020-03-24 ENCOUNTER — Telehealth: Payer: Self-pay | Admitting: Medical Oncology

## 2020-03-24 ENCOUNTER — Inpatient Hospital Stay: Payer: Medicare Other

## 2020-03-24 ENCOUNTER — Encounter (HOSPITAL_COMMUNITY): Payer: Self-pay | Admitting: *Deleted

## 2020-03-24 VITALS — BP 120/76 | HR 141 | Temp 98.3°F | Ht 71.0 in | Wt 193.0 lb

## 2020-03-24 DIAGNOSIS — N1831 Chronic kidney disease, stage 3a: Secondary | ICD-10-CM | POA: Diagnosis present

## 2020-03-24 DIAGNOSIS — C3431 Malignant neoplasm of lower lobe, right bronchus or lung: Secondary | ICD-10-CM | POA: Diagnosis present

## 2020-03-24 DIAGNOSIS — I4891 Unspecified atrial fibrillation: Secondary | ICD-10-CM | POA: Diagnosis not present

## 2020-03-24 DIAGNOSIS — Z9221 Personal history of antineoplastic chemotherapy: Secondary | ICD-10-CM

## 2020-03-24 DIAGNOSIS — E1165 Type 2 diabetes mellitus with hyperglycemia: Secondary | ICD-10-CM | POA: Diagnosis present

## 2020-03-24 DIAGNOSIS — E1140 Type 2 diabetes mellitus with diabetic neuropathy, unspecified: Secondary | ICD-10-CM | POA: Diagnosis present

## 2020-03-24 DIAGNOSIS — E1121 Type 2 diabetes mellitus with diabetic nephropathy: Secondary | ICD-10-CM | POA: Diagnosis present

## 2020-03-24 DIAGNOSIS — D701 Agranulocytosis secondary to cancer chemotherapy: Secondary | ICD-10-CM | POA: Diagnosis not present

## 2020-03-24 DIAGNOSIS — Z87891 Personal history of nicotine dependence: Secondary | ICD-10-CM | POA: Diagnosis not present

## 2020-03-24 DIAGNOSIS — C3491 Malignant neoplasm of unspecified part of right bronchus or lung: Secondary | ICD-10-CM

## 2020-03-24 DIAGNOSIS — R058 Other specified cough: Secondary | ICD-10-CM

## 2020-03-24 DIAGNOSIS — E1122 Type 2 diabetes mellitus with diabetic chronic kidney disease: Secondary | ICD-10-CM | POA: Diagnosis present

## 2020-03-24 DIAGNOSIS — T451X5A Adverse effect of antineoplastic and immunosuppressive drugs, initial encounter: Secondary | ICD-10-CM | POA: Diagnosis not present

## 2020-03-24 DIAGNOSIS — I480 Paroxysmal atrial fibrillation: Principal | ICD-10-CM | POA: Diagnosis present

## 2020-03-24 DIAGNOSIS — Z794 Long term (current) use of insulin: Secondary | ICD-10-CM

## 2020-03-24 DIAGNOSIS — Z79899 Other long term (current) drug therapy: Secondary | ICD-10-CM | POA: Diagnosis not present

## 2020-03-24 DIAGNOSIS — I129 Hypertensive chronic kidney disease with stage 1 through stage 4 chronic kidney disease, or unspecified chronic kidney disease: Secondary | ICD-10-CM | POA: Diagnosis present

## 2020-03-24 DIAGNOSIS — F3341 Major depressive disorder, recurrent, in partial remission: Secondary | ICD-10-CM | POA: Diagnosis not present

## 2020-03-24 DIAGNOSIS — E1169 Type 2 diabetes mellitus with other specified complication: Secondary | ICD-10-CM | POA: Diagnosis present

## 2020-03-24 DIAGNOSIS — K219 Gastro-esophageal reflux disease without esophagitis: Secondary | ICD-10-CM | POA: Diagnosis present

## 2020-03-24 DIAGNOSIS — N183 Chronic kidney disease, stage 3 unspecified: Secondary | ICD-10-CM | POA: Diagnosis not present

## 2020-03-24 DIAGNOSIS — I2510 Atherosclerotic heart disease of native coronary artery without angina pectoris: Secondary | ICD-10-CM | POA: Diagnosis present

## 2020-03-24 DIAGNOSIS — Z923 Personal history of irradiation: Secondary | ICD-10-CM

## 2020-03-24 DIAGNOSIS — E876 Hypokalemia: Secondary | ICD-10-CM | POA: Diagnosis not present

## 2020-03-24 DIAGNOSIS — D61810 Antineoplastic chemotherapy induced pancytopenia: Secondary | ICD-10-CM | POA: Diagnosis not present

## 2020-03-24 DIAGNOSIS — R0600 Dyspnea, unspecified: Secondary | ICD-10-CM | POA: Diagnosis not present

## 2020-03-24 DIAGNOSIS — E0841 Diabetes mellitus due to underlying condition with diabetic mononeuropathy: Secondary | ICD-10-CM | POA: Diagnosis not present

## 2020-03-24 DIAGNOSIS — E781 Pure hyperglyceridemia: Secondary | ICD-10-CM | POA: Diagnosis present

## 2020-03-24 DIAGNOSIS — Z20822 Contact with and (suspected) exposure to covid-19: Secondary | ICD-10-CM | POA: Diagnosis present

## 2020-03-24 DIAGNOSIS — I483 Typical atrial flutter: Secondary | ICD-10-CM | POA: Diagnosis present

## 2020-03-24 DIAGNOSIS — I10 Essential (primary) hypertension: Secondary | ICD-10-CM | POA: Diagnosis not present

## 2020-03-24 DIAGNOSIS — D6481 Anemia due to antineoplastic chemotherapy: Secondary | ICD-10-CM

## 2020-03-24 DIAGNOSIS — D649 Anemia, unspecified: Secondary | ICD-10-CM

## 2020-03-24 DIAGNOSIS — C3490 Malignant neoplasm of unspecified part of unspecified bronchus or lung: Secondary | ICD-10-CM

## 2020-03-24 DIAGNOSIS — R9431 Abnormal electrocardiogram [ECG] [EKG]: Secondary | ICD-10-CM | POA: Diagnosis present

## 2020-03-24 DIAGNOSIS — E0842 Diabetes mellitus due to underlying condition with diabetic polyneuropathy: Secondary | ICD-10-CM | POA: Diagnosis not present

## 2020-03-24 DIAGNOSIS — I1310 Hypertensive heart and chronic kidney disease without heart failure, with stage 1 through stage 4 chronic kidney disease, or unspecified chronic kidney disease: Secondary | ICD-10-CM | POA: Diagnosis not present

## 2020-03-24 DIAGNOSIS — Z8719 Personal history of other diseases of the digestive system: Secondary | ICD-10-CM | POA: Diagnosis not present

## 2020-03-24 DIAGNOSIS — G4733 Obstructive sleep apnea (adult) (pediatric): Secondary | ICD-10-CM | POA: Diagnosis not present

## 2020-03-24 DIAGNOSIS — J9601 Acute respiratory failure with hypoxia: Secondary | ICD-10-CM | POA: Diagnosis present

## 2020-03-24 DIAGNOSIS — N1832 Chronic kidney disease, stage 3b: Secondary | ICD-10-CM | POA: Diagnosis not present

## 2020-03-24 LAB — CBC WITH DIFFERENTIAL (CANCER CENTER ONLY)
Abs Immature Granulocytes: 0.08 10*3/uL — ABNORMAL HIGH (ref 0.00–0.07)
Basophils Absolute: 0 10*3/uL (ref 0.0–0.1)
Basophils Relative: 0 %
Eosinophils Absolute: 0 10*3/uL (ref 0.0–0.5)
Eosinophils Relative: 0 %
HCT: 21.9 % — ABNORMAL LOW (ref 39.0–52.0)
Hemoglobin: 7.2 g/dL — ABNORMAL LOW (ref 13.0–17.0)
Immature Granulocytes: 1 %
Lymphocytes Relative: 3 %
Lymphs Abs: 0.2 10*3/uL — ABNORMAL LOW (ref 0.7–4.0)
MCH: 30.4 pg (ref 26.0–34.0)
MCHC: 32.9 g/dL (ref 30.0–36.0)
MCV: 92.4 fL (ref 80.0–100.0)
Monocytes Absolute: 0.6 10*3/uL (ref 0.1–1.0)
Monocytes Relative: 9 %
Neutro Abs: 6.5 10*3/uL (ref 1.7–7.7)
Neutrophils Relative %: 87 %
Platelet Count: 127 10*3/uL — ABNORMAL LOW (ref 150–400)
RBC: 2.37 MIL/uL — ABNORMAL LOW (ref 4.22–5.81)
RDW: 15.1 % (ref 11.5–15.5)
WBC Count: 7.5 10*3/uL (ref 4.0–10.5)
nRBC: 0 % (ref 0.0–0.2)

## 2020-03-24 LAB — CMP (CANCER CENTER ONLY)
ALT: 28 U/L (ref 0–44)
AST: 20 U/L (ref 15–41)
Albumin: 1.8 g/dL — ABNORMAL LOW (ref 3.5–5.0)
Alkaline Phosphatase: 157 U/L — ABNORMAL HIGH (ref 38–126)
Anion gap: 8 (ref 5–15)
BUN: 15 mg/dL (ref 8–23)
CO2: 29 mmol/L (ref 22–32)
Calcium: 8.6 mg/dL — ABNORMAL LOW (ref 8.9–10.3)
Chloride: 97 mmol/L — ABNORMAL LOW (ref 98–111)
Creatinine: 1.27 mg/dL — ABNORMAL HIGH (ref 0.61–1.24)
GFR, Estimated: 59 mL/min — ABNORMAL LOW (ref >60)
Glucose, Bld: 114 mg/dL — ABNORMAL HIGH (ref 70–99)
Potassium: 3.7 mmol/L (ref 3.5–5.1)
Sodium: 134 mmol/L — ABNORMAL LOW (ref 135–145)
Total Bilirubin: 0.4 mg/dL (ref 0.3–1.2)
Total Protein: 6.4 g/dL — ABNORMAL LOW (ref 6.5–8.1)

## 2020-03-24 LAB — SAMPLE TO BLOOD BANK

## 2020-03-24 LAB — TSH: TSH: 1.183 u[IU]/mL (ref 0.350–4.500)

## 2020-03-24 LAB — RESP PANEL BY RT-PCR (FLU A&B, COVID) ARPGX2
Influenza A by PCR: NEGATIVE
Influenza B by PCR: NEGATIVE
SARS Coronavirus 2 by RT PCR: NEGATIVE

## 2020-03-24 LAB — D-DIMER, QUANTITATIVE: D-Dimer, Quant: 3.52 ug/mL-FEU — ABNORMAL HIGH (ref 0.00–0.50)

## 2020-03-24 LAB — CBG MONITORING, ED
Glucose-Capillary: 252 mg/dL — ABNORMAL HIGH (ref 70–99)
Glucose-Capillary: 243 mg/dL — ABNORMAL HIGH (ref 70–99)

## 2020-03-24 LAB — PREPARE RBC (CROSSMATCH)

## 2020-03-24 LAB — TROPONIN I (HIGH SENSITIVITY): Troponin I (High Sensitivity): 16 ng/L (ref <18)

## 2020-03-24 LAB — BRAIN NATRIURETIC PEPTIDE: B Natriuretic Peptide: 537.5 pg/mL — ABNORMAL HIGH (ref 0.0–100.0)

## 2020-03-24 MED ORDER — ONDANSETRON HCL 4 MG/2ML IJ SOLN: 4.0000 mg | Freq: Four times a day (QID) | INTRAMUSCULAR | Status: DC | PRN

## 2020-03-24 MED ORDER — DILTIAZEM HCL-DEXTROSE 125-5 MG/125ML-% IV SOLN (PREMIX)
5.0000 mg/h | INTRAVENOUS | Status: DC
  Administered 2020-03-24: 15:00:00 5 mg/h via INTRAVENOUS
  Administered 2020-03-24 – 2020-03-25 (×2): 15 mg/h via INTRAVENOUS
  Filled 2020-03-24 (×3): qty 125

## 2020-03-24 MED ORDER — INSULIN ASPART 100 UNIT/ML ~~LOC~~ SOLN
0.0000 [IU] | SUBCUTANEOUS | Status: DC
  Administered 2020-03-24: 23:00:00 5 [IU] via SUBCUTANEOUS
  Administered 2020-03-24 – 2020-03-25 (×2): 8 [IU] via SUBCUTANEOUS
  Administered 2020-03-25: 05:00:00 2 [IU] via SUBCUTANEOUS
  Administered 2020-03-25: 21:00:00 3 [IU] via SUBCUTANEOUS
  Administered 2020-03-25: 17:00:00 8 [IU] via SUBCUTANEOUS
  Administered 2020-03-26 (×2): 5 [IU] via SUBCUTANEOUS
  Administered 2020-03-26: 08:00:00 3 [IU] via SUBCUTANEOUS
  Administered 2020-03-26: 12:00:00 5 [IU] via SUBCUTANEOUS
  Filled 2020-03-24: qty 0.15

## 2020-03-24 MED ORDER — DIGOXIN 0.25 MG/ML IJ SOLN
0.5000 mg | Freq: Once | INTRAMUSCULAR | Status: AC
  Administered 2020-03-24: 18:00:00 0.5 mg via INTRAVENOUS
  Filled 2020-03-24: qty 2

## 2020-03-24 MED ORDER — DIGOXIN 0.25 MG/ML IJ SOLN: 0.2500 mg | Freq: Once | INTRAMUSCULAR | Status: DC

## 2020-03-24 MED ORDER — DIGOXIN 0.25 MG/ML IJ SOLN
0.2500 mg | Freq: Once | INTRAMUSCULAR | Status: AC
  Administered 2020-03-25: 02:00:00 0.25 mg via INTRAVENOUS
  Filled 2020-03-24 (×2): qty 1

## 2020-03-24 MED ORDER — DILTIAZEM LOAD VIA INFUSION
10.0000 mg | Freq: Once | INTRAVENOUS | Status: AC
  Administered 2020-03-24: 15:00:00 10 mg via INTRAVENOUS
  Filled 2020-03-24: qty 10

## 2020-03-24 MED ORDER — IOHEXOL 350 MG/ML SOLN
80.0000 mL | Freq: Once | INTRAVENOUS | Status: AC | PRN
  Administered 2020-03-24: 16:00:00 80 mL via INTRAVENOUS

## 2020-03-24 MED ORDER — SODIUM CHLORIDE (PF) 0.9 % IJ SOLN
INTRAMUSCULAR | Status: AC
  Filled 2020-03-24: qty 50

## 2020-03-24 MED ORDER — ACETAMINOPHEN 325 MG PO TABS: 650.0000 mg | ORAL_TABLET | ORAL | Status: DC | PRN

## 2020-03-24 MED ORDER — SODIUM CHLORIDE 0.9% IV SOLUTION: Freq: Once | INTRAVENOUS | Status: AC

## 2020-03-24 NOTE — ED Triage Notes (Signed)
Pt is a lung CA pt who presents with fast HR x 3 weeks.  Pt reports feeling SOB.  Pt is not taking anymore BP medications since his BP occasionally drops after standing up.  Pt reports intermittent chest pain.

## 2020-03-24 NOTE — ED Notes (Signed)
EDP [place] made aware that pt has reached max titration level of cardizem.

## 2020-03-24 NOTE — Telephone Encounter (Signed)
CRITICAL VALUE STICKER  CRITICAL VALUE: Hgb = 7.2  RECEIVER (on-site recipient of call): Yetta Glassman, CMA  DATE & TIME NOTIFIED: 03/24/2020 at 10:14am  MESSENGER (representative from lab): Suanne Marker  MD NOTIFIED: Dr. [name]  TIME OF NOTIFICATION: 03/24/20 at 10:15am  RESPONSE: Send pt to [facility] for 2 units of blood. Notification given to Diane, RN for follow-up.

## 2020-03-24 NOTE — Telephone Encounter (Signed)
Blood transfusion Appt I called to to tell him we are still trying to get his blood transfusion scheduled and a scheduler will call him.   Tachycardia now- He said he was on the way to the hospital because his "pulse was 160".

## 2020-03-24 NOTE — Progress Notes (Addendum)
Pt states that he has not worn CPAP in about 5 years.  Pt unsure if he wants to try tonight, pt will notify RT if he decides to wear.

## 2020-03-24 NOTE — ED Provider Notes (Signed)
[facility] DEPT Provider Note   CSN: [id number] Arrival date & time: 03/24/20  1310     History Chief Complaint  Patient presents with  . Tachycardia    [name] is a 74 y.o. male.  HPI      74yo male with history of lung cancer, pancytopenia, DM, OSA, hypertriglyceridemia, ht, recent diagnosis of atrial flutter not on anticoagulation due to thrombocytopenia 11/30, symptomatic anemia regarding transfusion, who presents with worsening dyspnea and fatigue. Reports symptoms began one month ago and has had progressive shortness of breath and fatigue. Denies lightheadedness. No new cough, no fever, no n/v.  Reports having chest pain which is new--feels like a pin sticking in his chest. Nothing makes it better or worse.   HR was elevated to 160s at recent Cardiology appointment, metoprolol increased at that time. Has been taking it without change.    Past Medical History:  Diagnosis Date  . Allergic rhinitis due to pollen   . Chronic kidney disease, stage III (moderate) ([place]) 12/19/2012  . Depression   . Diabetes mellitus ([place])   . Diverticulosis   . Erectile dysfunction associated with type 2 diabetes mellitus ([place]) 05/28/2015  . Former very heavy cigarette smoker (more than 40 per day) 10/07/2014  . GERD (gastroesophageal reflux disease)   . Gout   . Hyperlipidemia   . Hypertension   . Hypogonadism male 06/14/2012  . Memory loss   . Osteoarthritis   . Personal history of colonic polyps    1996  . Small cell carcinoma of right lung ([place]) 10/23/2019    Patient Active Problem List   Diagnosis Date Noted  . Paroxysmal atrial fibrillation with RVR ([place]) 03/24/2020  . Atrial fibrillation with RVR ([place]) 03/24/2020  . Secondary hypercoagulable state ([place]) 03/19/2020  . Typical atrial flutter ([place]) 03/11/2020  . Antineoplastic chemotherapy induced pancytopenia ([place]) 03/11/2020  . Severe protein-calorie malnutrition ([place]) 02/12/2020  .  Chemotherapy induced neutropenia ([place])   . Severe sepsis ([place]) 11/22/2019  . Neutropenic fever ([place]) 11/22/2019  . Non-small cell carcinoma of right lung, stage 3 ([place]) 10/23/2019  . Goals of care, counseling/discussion 10/23/2019  . Encounter for antineoplastic chemotherapy 10/23/2019  . Small cell lung cancer ([place]) 10/12/2019  . Dropfoot 03/15/2019  . Mild cognitive impairment 05/08/2018  . Esophageal dysphagia   . Diabetic foot ulcer ([place]) 06/10/2017  . Uncontrolled type 2 diabetes mellitus with peripheral neuropathy ([place]) 05/11/2016  . Erectile dysfunction associated with type 2 diabetes mellitus ([place]) 05/28/2015  . Former very heavy cigarette smoker (more than 40 per day) 10/07/2014  . Chronic kidney disease, stage III (moderate) ([place]) 12/19/2012  . Hypogonadism male 06/14/2012  . OSA (obstructive sleep apnea) 03/09/2011  . Diabetic neuropathy ([place]) 12/29/2010  . Family history of MI (myocardial infarction) 09/16/2010  . HIP REPLACEMENT, RIGHT, HX OF 02/05/2010  . Hypertriglyceridemia 06/13/2008  . GOUT 06/13/2008  . Major depressive disorder, recurrent episode, in partial remission ([place]) 06/13/2008  . Essential hypertension 06/13/2008  . GERD 06/13/2008  . OSTEOARTHRITIS 06/13/2008    Past Surgical History:  Procedure Laterality Date  . BRONCHIAL NEEDLE ASPIRATION BIOPSY  10/17/2019   Procedure: BRONCHIAL NEEDLE ASPIRATION BIOPSIES;  Surgeon: Juanito Doom, MD;  Location: [facility];  Service: Cardiopulmonary;;  . CARDIAC CATHETERIZATION  11/2011   [facility]  . CHOLECYSTECTOMY  1980  . ENDOBRONCHIAL ULTRASOUND Bilateral 10/17/2019   Procedure: ENDOBRONCHIAL ULTRASOUND;  Surgeon: Juanito Doom, MD;  Location: [facility];  Service: Cardiopulmonary;  Laterality: Bilateral;  .  ESOPHAGOGASTRODUODENOSCOPY (EGD) WITH PROPOFOL N/A 09/27/2017   Procedure: ESOPHAGOGASTRODUODENOSCOPY (EGD) WITH PROPOFOL;  Surgeon: Lin Landsman, MD;  Location: [facility];  Service:  Gastroenterology;  Laterality: N/A;  . EYE SURGERY Right 07/29/2016   cataract - Dr. Manuella Ghazi  . EYE SURGERY Left 08/23/2106   cataract - Dr. Manuella Ghazi  . HEMOSTASIS CONTROL  10/17/2019   Procedure: HEMOSTASIS CONTROL;  Surgeon: Juanito Doom, MD;  Location: [facility];  Service: Cardiopulmonary;;  . IR IMAGING GUIDED PORT INSERTION  11/30/2019  . IRRIGATION AND DEBRIDEMENT ABSCESS Left 11/23/2019   Procedure: IRRIGATION AND DEBRIDEMENT ABSCESS;  Surgeon: Melina Schools, MD;  Location: [facility];  Service: Orthopedics;  Laterality: Left;  . TOTAL HIP ARTHROPLASTY  2009   Dr. Gladstone Lighter  . TOTAL HIP ARTHROPLASTY         Family History  Problem Relation Age of Onset  . Diabetes Mother   . Alzheimer's disease Mother   . Diabetes Father   . Lung cancer Father        Age 61  . Stroke Father   . Heart attack Father   . Lung cancer Sister        lung  . Heart disease Maternal Grandfather   . COPD Sister   . Heart attack Sister   . Heart disease Sister     Social History   Tobacco Use  . Smoking status: Former Smoker    Packs/day: 3.00    Years: 35.00    Pack years: 105.00    Types: Cigarettes    Quit date: 04/12/2000    Years since quitting: 19.9  . Smokeless tobacco: Never Used  Vaping Use  . Vaping Use: Never used  Substance Use Topics  . Alcohol use: No  . Drug use: No    Home Medications Prior to Admission medications   Medication Sig Start Date End Date Taking? Authorizing Provider  acetaminophen (TYLENOL) 650 MG CR tablet Take 650 mg by mouth every 8 (eight) hours as needed for pain.   Yes [provider]  albuterol (VENTOLIN HFA) 108 (90 Base) MCG/ACT inhaler Inhale 1-2 puffs into the lungs every 6 (six) hours as needed for wheezing or shortness of breath. 02/15/20  Yes Tanner, Lyndon Code., [name]  Ascorbic Acid (VITAMIN C) 1000 MG tablet Take 1,000 mg by mouth daily.   Yes [provider]  cholecalciferol (VITAMIN D) 1000 UNITS tablet Take 2,000 Units by mouth  daily.   Yes [provider]  donepezil (ARICEPT) 10 MG tablet TAKE 1 TABLET BY MOUTH AT BEDTIME Patient taking differently: Take 10 mg by mouth at bedtime. 08/24/19  Yes Copland, Frederico Hamman, MD  fluticasone (FLONASE) 50 MCG/ACT nasal spray PLACE 2 SPRAYS IN EACH NOSTRIL DAILY Patient taking differently: Place 1 spray into both nostrils daily. 01/26/19  Yes Copland, Frederico Hamman, MD  guaiFENesin (MUCINEX) 600 MG 12 hr tablet Take 600 mg by mouth daily as needed for cough.   Yes [provider]  Hyprom-Naphaz-Polysorb-Zn Sulf (CLEAR EYES COMPLETE OP) Place 1 drop into both eyes daily as needed (dry eyes).   Yes [provider]  insulin regular human CONCENTRATED (HUMULIN R U-500 KWIKPEN) 500 UNIT/ML kwikpen Inject 40 Units into the skin with breakfast, with lunch, and with evening meal.   Yes [provider]  memantine (NAMENDA) 10 MG tablet TAKE 1 TABLET BY MOUTH TWICE A DAY Patient taking differently: Take 10 mg by mouth 2 (two) times daily. 09/20/19  Yes Copland, Frederico Hamman, MD  montelukast (SINGULAIR) 10  MG tablet Take 1 tablet (10 mg total) by mouth at bedtime. 09/20/19  Yes Bedsole, Amy E, MD  pantoprazole (PROTONIX) 20 MG tablet Take 1 tablet (20 mg total) by mouth daily. 01/21/20  Yes Heilingoetter, Cassandra L, [name]  prochlorperazine (COMPAZINE) 10 MG tablet Take 1 tablet (10 mg total) by mouth every 6 (six) hours as needed. Patient taking differently: Take 10 mg by mouth every 6 (six) hours as needed for nausea or vomiting. 10/23/19  Yes Heilingoetter, Cassandra L, [name]  Semaglutide (OZEMPIC, 0.25 OR 0.5 MG/DOSE, [place]) Inject 0.25 mg into the skin once a week. Sundays   Yes [provider]  simvastatin (ZOCOR) 40 MG tablet TAKE 1 TABLET BY MOUTH AT BEDTIME Patient taking differently: Take 40 mg by mouth daily at 6 PM. 04/30/19  Yes Copland, Frederico Hamman, MD  venlafaxine XR (EFFEXOR-XR) 75 MG 24 hr capsule TAKE 1 CAPSULE BY MOUTH EVERY MORNING AND 2 CAPSULES AT  BEDTIME Patient taking differently: Take 75 mg by mouth See admin instructions. Takes 1 capsule in the morning and 2 capsules at night. 11/26/19  Yes Copland, Frederico Hamman, MD  Insulin Pen Needle (PEN NEEDLES) 31G X 5 MM MISC 1 each by Does not apply route 3 (three) times daily. To inject insulin E11.42 09/20/19   Philemon Kingdom, MD  metoprolol succinate (TOPROL-XL) 25 MG 24 hr tablet Take 2 tablets (50 mg total) by mouth daily for 30 doses. Patient not taking: No sig reported 03/19/20 04/18/20  Fenton, Clint R, PA    Allergies    Tetracycline  Review of Systems   Review of Systems  Constitutional: Positive for fatigue. Negative for fever.  HENT: Negative for congestion.   Respiratory: Positive for shortness of breath. Negative for cough.   Cardiovascular: Positive for chest pain and leg swelling.  Gastrointestinal: Negative for abdominal pain, constipation, diarrhea, nausea and vomiting. Blood in stool: once last week.  Skin: Negative for rash.  Neurological: Negative for headaches.    Physical Exam Updated Vital Signs BP (!) 148/72   Pulse (!) 109   Temp 98.1 F (36.7 C) (Oral)   Resp 15   Ht 5\' 11"  (1.803 m)   Wt 83.9 kg   SpO2 100%   BMI 25.80 kg/m   Physical Exam Vitals and nursing note reviewed.  Constitutional:      General: He is not in acute distress.    Appearance: He is well-developed and well-nourished. He is not diaphoretic.  HENT:     Head: Normocephalic and atraumatic.  Eyes:     Extraocular Movements: EOM normal.     Conjunctiva/sclera: Conjunctivae normal.  Cardiovascular:     Rate and Rhythm: Regular rhythm. Tachycardia present.     Pulses: Intact distal pulses.  Pulmonary:     Effort: Pulmonary effort is normal. No respiratory distress.     Breath sounds: Normal breath sounds. No wheezing or rales.  Abdominal:     General: There is no distension.     Palpations: Abdomen is soft.     Tenderness: There is no abdominal tenderness. There is no guarding.   Musculoskeletal:        General: No edema.     Cervical back: Normal range of motion.  Skin:    General: Skin is warm and dry.  Neurological:     Mental Status: He is alert and oriented to person, place, and time.     ED Results / Procedures / Treatments   Labs (all labs ordered are listed, but only  abnormal results are displayed) Labs Reviewed  BRAIN NATRIURETIC PEPTIDE - Abnormal; Notable for the following components:      Result Value   B Natriuretic Peptide 537.5 (*)    All other components within normal limits  D-DIMER, QUANTITATIVE (NOT AT [facility]) - Abnormal; Notable for the following components:   D-Dimer, Quant 3.52 (*)    All other components within normal limits  CBG MONITORING, ED - Abnormal; Notable for the following components:   Glucose-Capillary 252 (*)    All other components within normal limits  RESP PANEL BY RT-PCR (FLU A&B, COVID) ARPGX2  TSH  COMPREHENSIVE METABOLIC PANEL  MAGNESIUM  LIPID PANEL  PHOSPHORUS  TYPE AND SCREEN  PREPARE RBC (CROSSMATCH)  TROPONIN I (HIGH SENSITIVITY)    EKG EKG Interpretation  Date/Time:  Monday March 24 2020 13:42:40 EST Ventricular Rate:  163 PR Interval:    QRS Duration: 83 QT Interval:  274 QTC Calculation: 452 R Axis:   41 Text Interpretation: Atrial flutter with 2 to 1 block No significant change since last tracing 12 Lead; [place] Confirmed by Gareth Morgan [phone]) on 03/24/2020 10:08:40 PM   Radiology CT Angio Chest PE W and/or Wo Contrast  Result Date: 03/24/2020 CLINICAL DATA:  Tachycardia for 3 weeks, shortness of breath, intermittent chest pain, question pulmonary embolism; history of lung cancer post chemotherapy and radiation therapy, history hypertension, type II diabetes mellitus, stage III chronic kidney disease, former smoker EXAM: CT ANGIOGRAPHY CHEST WITH CONTRAST TECHNIQUE: Multidetector CT imaging of the chest was performed using the standard protocol during bolus administration of  intravenous contrast. Multiplanar CT image reconstructions and MIPs were obtained to evaluate the vascular anatomy. CONTRAST:  61mL OMNIPAQUE IOHEXOL 350 MG/ML SOLN IV COMPARISON:  CT chest 03/21/2020 FINDINGS: Cardiovascular: Atherosclerotic calcifications of aorta, proximal great vessels and coronary arteries. Aorta normal caliber without aneurysm or dissection. RIGHT jugular Port-A-Cath with tip in RIGHT atrium. Cardiac chambers unremarkable. No pericardial effusion. Pulmonary arteries adequately opacified. Few scattered respiratory motion artifacts are present, notably LEFT lower lobe. No definite pulmonary emboli identified. Mediastinum/Nodes: 12 mm nodule RIGHT thyroid lobe; not clinically significant; no follow-up imaging recommended (ref: J Am Coll Radiol. 2015 Feb;12(2): 143-50).2.2 cm RIGHT paratracheal node image 51 unchanged. Additional scattered normal size mediastinal nodes. Previously measured subcarinal node less well delineated due to minimal infiltration. Esophagus unremarkable. Lungs/Pleura: BILATERAL pleural effusions increased from previous exam, partially loculated on RIGHT. Cavitary lesion and posterior RIGHT upper lobe again seen. Perihilar consolidation again seen. Narrowing of bronchus intermedius and proximal RIGHT middle lobe/RIGHT lower lobe bronchi. Narrowing of RIGHT upper lobe bronchus. Extensive infiltrate RIGHT upper lobe and superior segment RIGHT lower lobe unchanged likely due to radiation. Scattered areas of atelectasis little changed. Remaining LEFT lung clear. No pneumothorax. Upper Abdomen: Prior cholecystectomy. No acute upper abdominal abnormalities. Musculoskeletal: No definite osseous lesions. Scattered degenerative disc disease changes thoracic spine. Review of the MIP images confirms the above findings. IMPRESSION: No definite evidence of pulmonary embolism. Stable RIGHT paratracheal adenopathy. Increased BILATERAL pleural effusions with compressive atelectasis of the  adjacent lower lobes. Persistent perihilar consolidation RIGHT lung with narrowing of the origins of the lobar bronchi. Cavitary focus RIGHT upper lobe unchanged. Extensive infiltrates throughout RIGHT upper lobe and at superior segment of RIGHT lower lobe question related to radiation therapy. Aortic Atherosclerosis (ICD10-I70.0). Electronically Signed   By: Lavonia Dana M.D.   On: 03/24/2020 16:27   DG Chest Portable 1 View  Result Date: 03/24/2020 CLINICAL DATA:  Shortness of breath.  History of lung cancer. EXAM: PORTABLE CHEST 1 VIEW COMPARISON:  November 26, 2019. FINDINGS: The heart size and mediastinal contours are within normal limits. Left lung is clear. Increased right upper lobe and right perihilar opacity is noted concerning for pneumonia. Increased right basilar atelectasis or infiltrate is noted with associated pleural effusion. No pneumothorax is noted. Interval placement of right internal jugular Port-A-Cath with distal tip in expected position of the SVC. The visualized skeletal structures are unremarkable. IMPRESSION: Increased right upper lobe and right perihilar opacity is noted concerning for pneumonia. Increased right basilar atelectasis or infiltrate is noted with associated pleural effusion. Electronically Signed   By: Marijo Conception M.D.   On: 03/24/2020 14:52    Procedures .Critical Care Performed by: Gareth Morgan, MD Authorized by: Gareth Morgan, MD   Critical care provider statement:    Critical care time (minutes):  45   Critical care was time spent personally by me on the following activities:  Evaluation of patient's response to treatment, examination of patient, ordering and performing treatments and interventions, ordering and review of laboratory studies, ordering and review of radiographic studies, pulse oximetry, re-evaluation of patient's condition, obtaining history from patient or surrogate and review of old charts   (including critical care  time)  Medications Ordered in ED Medications  diltiazem (CARDIZEM) 1 mg/mL load via infusion 10 mg (10 mg Intravenous Bolus from Bag 03/24/20 1510)    And  diltiazem (CARDIZEM) 125 mg in dextrose 5% 125 mL (1 mg/mL) infusion (15 mg/hr Intravenous Rate/Dose Change 03/24/20 1627)  acetaminophen (TYLENOL) tablet 650 mg (has no administration in time range)  ondansetron (ZOFRAN) injection 4 mg (has no administration in time range)  digoxin (LANOXIN) 0.25 MG/ML injection 0.25 mg (has no administration in time range)  insulin aspart (novoLOG) injection 0-15 Units (8 Units Subcutaneous Given 03/24/20 2036)  0.9 %  sodium chloride infusion (Manually program via Guardrails IV Fluids) ( Intravenous Stopped 03/24/20 1954)  sodium chloride (PF) 0.9 % injection (  Given by Other 03/24/20 1704)  iohexol (OMNIPAQUE) 350 MG/ML injection 80 mL (80 mLs Intravenous Contrast Given 03/24/20 1537)  digoxin (LANOXIN) 0.25 MG/ML injection 0.5 mg (0.5 mg Intravenous Given 03/24/20 1823)    ED Course  I have reviewed the triage vital signs and the nursing notes.  Pertinent labs & imaging results that were available during my care of the patient were reviewed by me and considered in my medical decision making (see chart for details).    MDM Rules/Calculators/A&P                          74yo male with history of lung cancer, pancytopenia, DM, OSA, hypertriglyceridemia, ht, recent diagnosis of atrial flutter not on anticoagulation due to thrombocytopenia 11/30, symptomatic anemia regarding transfusion, who presents with worsening dyspnea and fatigue.  Labs from office today show hgb 7.2, platelets improved to 127.  DDimer positive. Given positive DDimer, hs of dyspnea with chest pain, cancer hx, new onset atrial flutter with no prior imaging CT PE study ordered.    Given diltiazem bolus and gtt with continued tachycardia, continuing to titrate medications.  Ordered blood transfusion for symptomatic anemia after  obtaining consent.  Plan to admit-awaiting PE scan at [facility] transfer of care.   Final Clinical Impression(s) / ED Diagnoses Final diagnoses:  Typical atrial flutter (HCC)  Symptomatic anemia  Dyspnea, unspecified type    Rx / DC Orders ED Discharge Orders  Ordered    Amb referral to [facility] Clinic        03/24/20 1825           Gareth Morgan, MD 03/24/20 2225

## 2020-03-24 NOTE — ED Notes (Signed)
Written consent to administer blood products obtained and witnessed by this RN, left at bedside.

## 2020-03-24 NOTE — Telephone Encounter (Signed)
Schedule message sent for blood transfusion this week. Pt notified.

## 2020-03-24 NOTE — H&P (Signed)
[facility] History and Physical  [name] GYI:[id number] DOB: 1945-09-25 DOA: 03/24/2020  Referring physician:  PCP: [name], MD   Chief Complaint:   HPI: [name] is a 74 y.o. 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  States has had increasing S OB x3 weeks recently admitted to hospital for S OB and A. fib/flutter.  States when he was released A. fib still was not controlled.   Review of Systems:  Covid vaccination; vaccinated  Constitutional:  No weight loss, night sweats, Fevers, chills, fatigue.  HEENT:  No headaches, Difficulty swallowing,Tooth/dental problems,Sore throat,  No sneezing, itching, ear ache, nasal congestion, post nasal drip,  Cardio-vascular:  No chest pain, Orthopnea, PND, swelling in lower extremities, anasarca, dizziness, palpitations  GI:  No heartburn, indigestion, abdominal pain, nausea, vomiting, diarrhea, change in bowel habits, loss of appetite  Resp:  No shortness of breath with exertion or at rest. No excess mucus, no productive cough, No non-productive cough, No coughing up of blood.No change in color of mucus.No wheezing.No chest wall deformity  Skin:  no rash or lesions.  GU:  no dysuria, change in color of urine, no urgency or frequency. No flank pain.  Musculoskeletal:  No joint pain or swelling. No decreased range of motion. No back pain.  Psych:  No change in mood or affect. No depression or anxiety. No memory loss.   Past Medical History:  Diagnosis Date  . Allergic rhinitis due to pollen   . Chronic kidney disease, stage III (moderate) ([place]) 12/19/2012  . Depression   . Diabetes mellitus ([place])   . Diverticulosis    . Erectile dysfunction associated with type 2 diabetes mellitus ([place]) 05/28/2015  . Former very heavy cigarette smoker (more than 40 per day) 10/07/2014  . GERD (gastroesophageal reflux disease)   . Gout   . Hyperlipidemia   . Hypertension   . Hypogonadism male 06/14/2012  . Memory loss   . Osteoarthritis   . Personal history of colonic polyps    1996  . Small cell carcinoma of right lung ([place]) 10/23/2019   Past Surgical History:  Procedure Laterality Date  . BRONCHIAL NEEDLE ASPIRATION BIOPSY  10/17/2019   Procedure: BRONCHIAL NEEDLE ASPIRATION BIOPSIES;  Surgeon: Juanito Doom, MD;  Location: [facility];  Service: Cardiopulmonary;;  . CARDIAC CATHETERIZATION  11/2011   [facility]  . CHOLECYSTECTOMY  1980  . ENDOBRONCHIAL ULTRASOUND Bilateral 10/17/2019   Procedure: ENDOBRONCHIAL ULTRASOUND;  Surgeon: Juanito Doom, MD;  Location: [facility];  Service: Cardiopulmonary;  Laterality: Bilateral;  . ESOPHAGOGASTRODUODENOSCOPY (EGD) WITH PROPOFOL N/A 09/27/2017   Procedure: ESOPHAGOGASTRODUODENOSCOPY (EGD) WITH PROPOFOL;  Surgeon: Lin Landsman, MD;  Location: [facility];  Service: Gastroenterology;  Laterality: N/A;  . EYE SURGERY Right 07/29/2016   cataract - Dr. Manuella Ghazi  . EYE SURGERY Left 08/23/2106   cataract - Dr. Manuella Ghazi  . HEMOSTASIS CONTROL  10/17/2019   Procedure: HEMOSTASIS CONTROL;  Surgeon: Juanito Doom, MD;  Location: [facility];  Service: Cardiopulmonary;;  . IR IMAGING GUIDED PORT INSERTION  11/30/2019  . IRRIGATION AND DEBRIDEMENT ABSCESS Left 11/23/2019   Procedure: IRRIGATION AND DEBRIDEMENT ABSCESS;  Surgeon: Melina Schools, MD;  Location: [facility];  Service: Orthopedics;  Laterality: Left;  . TOTAL  HIP ARTHROPLASTY  2009   Dr. Gladstone Lighter  . TOTAL HIP ARTHROPLASTY     Social History:  reports that he quit smoking about 19 years ago. His smoking use included cigarettes. He has a 105.00 pack-year smoking history. He has never used smokeless tobacco. He reports  that he does not drink alcohol and does not use drugs.  Allergies  Allergen Reactions  . Tetracycline Itching and Rash    Family History  Problem Relation Age of Onset  . Diabetes Mother   . Alzheimer's disease Mother   . Diabetes Father   . Lung cancer Father        Age 34  . Stroke Father   . Heart attack Father   . Lung cancer Sister        lung  . Heart disease Maternal Grandfather   . COPD Sister   . Heart attack Sister   . Heart disease Sister      Prior to Admission medications   Medication Sig Start Date End Date Taking? Authorizing Provider  acetaminophen (TYLENOL) 650 MG CR tablet Take 650 mg by mouth every 8 (eight) hours as needed for pain.   Yes [provider]  albuterol (VENTOLIN HFA) 108 (90 Base) MCG/ACT inhaler Inhale 1-2 puffs into the lungs every 6 (six) hours as needed for wheezing or shortness of breath. 02/15/20  Yes Tanner, Lyndon Code., [name]  Ascorbic Acid (VITAMIN C) 1000 MG tablet Take 1,000 mg by mouth daily.   Yes [provider]  cholecalciferol (VITAMIN D) 1000 UNITS tablet Take 2,000 Units by mouth daily.   Yes [provider]  donepezil (ARICEPT) 10 MG tablet TAKE 1 TABLET BY MOUTH AT BEDTIME Patient taking differently: Take 10 mg by mouth at bedtime. 08/24/19  Yes Copland, Frederico Hamman, MD  fluticasone (FLONASE) 50 MCG/ACT nasal spray PLACE 2 SPRAYS IN EACH NOSTRIL DAILY Patient taking differently: Place 1 spray into both nostrils daily. 01/26/19  Yes Copland, Frederico Hamman, MD  guaiFENesin (MUCINEX) 600 MG 12 hr tablet Take 600 mg by mouth daily as needed for cough.   Yes [provider]  Hyprom-Naphaz-Polysorb-Zn Sulf (CLEAR EYES COMPLETE OP) Place 1 drop into both eyes daily as needed (dry eyes).   Yes [provider]  insulin regular human CONCENTRATED (HUMULIN R U-500 KWIKPEN) 500 UNIT/ML kwikpen Inject 40 Units into the skin with breakfast, with lunch, and with evening meal.   Yes [provider]   memantine (NAMENDA) 10 MG tablet TAKE 1 TABLET BY MOUTH TWICE A DAY Patient taking differently: Take 10 mg by mouth 2 (two) times daily. 09/20/19  Yes Copland, Frederico Hamman, MD  montelukast (SINGULAIR) 10 MG tablet Take 1 tablet (10 mg total) by mouth at bedtime. 09/20/19  Yes Bedsole, Amy E, MD  pantoprazole (PROTONIX) 20 MG tablet Take 1 tablet (20 mg total) by mouth daily. 01/21/20  Yes Heilingoetter, Cassandra L, [name]  prochlorperazine (COMPAZINE) 10 MG tablet Take 1 tablet (10 mg total) by mouth every 6 (six) hours as needed. Patient taking differently: Take 10 mg by mouth every 6 (six) hours as needed for nausea or vomiting. 10/23/19  Yes Heilingoetter, Cassandra L, [name]  Semaglutide (OZEMPIC, 0.25 OR 0.5 MG/DOSE, [place]) Inject 0.25 mg into the skin once a week. Sundays   Yes [provider]  simvastatin (ZOCOR) 40 MG tablet TAKE 1 TABLET BY MOUTH AT BEDTIME Patient taking differently: Take 40 mg by mouth daily at 6 PM. 04/30/19  Yes Copland, Frederico Hamman, MD  venlafaxine XR [facility])  75 MG 24 hr capsule TAKE 1 CAPSULE BY MOUTH EVERY MORNING AND 2 CAPSULES AT BEDTIME Patient taking differently: Take 75 mg by mouth See admin instructions. Takes 1 capsule in the morning and 2 capsules at night. 11/26/19  Yes Copland, Frederico Hamman, MD  Insulin Pen Needle (PEN NEEDLES) 31G X 5 MM MISC 1 each by Does not apply route 3 (three) times daily. To inject insulin E11.42 09/20/19   Philemon Kingdom, MD  metoprolol succinate (TOPROL-XL) 25 MG 24 hr tablet Take 2 tablets (50 mg total) by mouth daily for 30 doses. Patient not taking: No sig reported 03/19/20 04/18/20  Oliver Barre, PA     Consultants:  Cardiology  Procedures/Significant Events:  12/1 echocardiogram;Left Ventricle: Left ventricular ejection fraction, by estimation, is 65  to 70%. The left ventricle has hyperdynamic function. The left ventricle  has no regional wall motion abnormalities. Definity contrast agent was  given IV to delineate the left   ventricular endocardial borders. The left ventricular internal cavity size  was small. There is mild concentric left ventricular hypertrophy. Left  ventricular diastolic parameters are indeterminate.   Right Ventricle: The right ventricular size is normal. Mildly increased  right ventricular wall thickness. Right ventricular systolic function is  normal.   Left Atrium: Left atrial size was normal in size.   Right Atrium: Right atrial size was normal in size.   Pericardium: Trivial pericardial effusion is present.   Mitral Valve: The mitral valve is grossly normal. No evidence of mitral  valve regurgitation.   Tricuspid Valve: The tricuspid valve is normal in structure. Tricuspid  valve regurgitation is not demonstrated.   Aortic Valve: The aortic valve is tricuspid. There is mild calcification  of the aortic valve. Aortic valve regurgitation is not visualized.   Pulmonic Valve: The pulmonic valve was not well visualized. Pulmonic valve  regurgitation is not visualized.   Aorta: The aortic root is normal in size and structure.   Venous: The pulmonary veins were not well visualized. The inferior vena  cava is normal in size with greater than 50% respiratory variability,  suggesting right atrial pressure of 3 mmHg.   IAS/Shunts: The atrial septum is grossly normal.   I have personally reviewed and interpreted all radiology studies and my findings are as above.   VENTILATOR SETTINGS:    Cultures   Antimicrobials:    Devices    LINES / TUBES:      Continuous Infusions: . diltiazem (CARDIZEM) infusion 15 mg/hr (03/24/20 1627)    Physical Exam: Vitals:   03/24/20 1711 03/24/20 1715 03/24/20 1730 03/24/20 1745  BP: 132/84  (!) 134/98   Pulse: (!) 149 (!) 160 (!) 159 (!) 160  Resp: (!) 22 18 (!) 22 (!) 23  Temp: 98.1 F (36.7 C)     TempSrc: Oral     SpO2:  100% 100% 100%  Weight:      Height:        Wt Readings from Last 3 Encounters:  03/24/20  83.9 kg  03/24/20 87.5 kg  03/19/20 85.2 kg    General: A/O x4, No acute respiratory distress Eyes: negative scleral hemorrhage, negative anisocoria, negative icterus ENT: Negative Runny nose, negative gingival bleeding, Neck:  Negative scars, masses, torticollis, lymphadenopathy, JVD Lungs: diffuse decreased breath sounds, diffuse inspiratory and expiratory wheezing RIGHT>>> LEFT Cardiovascular: Irregular irregular rhythm and rate without murmur gallop or rub normal S1 and S2 Abdomen: negative abdominal pain, nondistended, positive soft, bowel sounds, no rebound, no ascites, no  appreciable mass Extremities: No significant cyanosis, clubbing, or edema bilateral lower extremities Skin: Negative rashes, lesions, ulcers Psychiatric:  Negative depression, negative anxiety, negative fatigue, negative mania  Central nervous system:  Cranial nerves II through XII intact, tongue/uvula midline, all extremities muscle strength 5/5, sensation intact throughout, finger nose finger bilateral within normal limits, quick finger touch bilateral within normal limits, negative Romberg sign, heel to shin bilateral within normal limits, standing on 1 foot bilateral within normal limits, walking on tiptoes within normal limits, walking on heels within normal limits, negative dysarthria, negative expressive aphasia, negative receptive aphasia.        Labs on Admission:  Basic Metabolic Panel: Recent Labs  Lab 03/18/20 0853 03/24/20 0943  NA 134* 134*  K 4.2 3.7  CL 95* 97*  CO2 29 29  GLUCOSE 370* 114*  BUN 15 15  CREATININE 1.54* 1.27*  CALCIUM 8.9 8.6*   Liver Function Tests: Recent Labs  Lab 03/18/20 0853 03/24/20 0943  AST 14* 20  ALT 40 28  ALKPHOS 199* 157*  BILITOT 0.6 0.4  PROT 6.5 6.4*  ALBUMIN 2.0* 1.8*   No results for input(s): LIPASE, AMYLASE in the last 168 hours. No results for input(s): AMMONIA in the last 168 hours. CBC: Recent Labs  Lab 03/18/20 0853 03/24/20 0943   WBC 6.8 7.5  NEUTROABS 5.6 6.5  HGB 8.2* 7.2*  HCT 24.7* 21.9*  MCV 90.8 92.4  PLT 44* 127*   Cardiac Enzymes: No results for input(s): CKTOTAL, CKMB, CKMBINDEX, TROPONINI in the last 168 hours.  BNP (last 3 results) Recent Labs    03/24/20 1426  BNP 537.5*    ProBNP (last 3 results) No results for input(s): PROBNP in the last 8760 hours.  CBG: No results for input(s): GLUCAP in the last 168 hours.  Radiological Exams on Admission: CT Angio Chest PE W and/or Wo Contrast  Result Date: 03/24/2020 CLINICAL DATA:  Tachycardia for 3 weeks, shortness of breath, intermittent chest pain, question pulmonary embolism; history of lung cancer post chemotherapy and radiation therapy, history hypertension, type II diabetes mellitus, stage III chronic kidney disease, former smoker EXAM: CT ANGIOGRAPHY CHEST WITH CONTRAST TECHNIQUE: Multidetector CT imaging of the chest was performed using the standard protocol during bolus administration of intravenous contrast. Multiplanar CT image reconstructions and MIPs were obtained to evaluate the vascular anatomy. CONTRAST:  29mL OMNIPAQUE IOHEXOL 350 MG/ML SOLN IV COMPARISON:  CT chest 03/21/2020 FINDINGS: Cardiovascular: Atherosclerotic calcifications of aorta, proximal great vessels and coronary arteries. Aorta normal caliber without aneurysm or dissection. RIGHT jugular Port-A-Cath with tip in RIGHT atrium. Cardiac chambers unremarkable. No pericardial effusion. Pulmonary arteries adequately opacified. Few scattered respiratory motion artifacts are present, notably LEFT lower lobe. No definite pulmonary emboli identified. Mediastinum/Nodes: 12 mm nodule RIGHT thyroid lobe; not clinically significant; no follow-up imaging recommended (ref: J Am Coll Radiol. 2015 Feb;12(2): 143-50).2.2 cm RIGHT paratracheal node image 51 unchanged. Additional scattered normal size mediastinal nodes. Previously measured subcarinal node less well delineated due to minimal  infiltration. Esophagus unremarkable. Lungs/Pleura: BILATERAL pleural effusions increased from previous exam, partially loculated on RIGHT. Cavitary lesion and posterior RIGHT upper lobe again seen. Perihilar consolidation again seen. Narrowing of bronchus intermedius and proximal RIGHT middle lobe/RIGHT lower lobe bronchi. Narrowing of RIGHT upper lobe bronchus. Extensive infiltrate RIGHT upper lobe and superior segment RIGHT lower lobe unchanged likely due to radiation. Scattered areas of atelectasis little changed. Remaining LEFT lung clear. No pneumothorax. Upper Abdomen: Prior cholecystectomy. No acute upper abdominal abnormalities. Musculoskeletal: No  definite osseous lesions. Scattered degenerative disc disease changes thoracic spine. Review of the MIP images confirms the above findings. IMPRESSION: No definite evidence of pulmonary embolism. Stable RIGHT paratracheal adenopathy. Increased BILATERAL pleural effusions with compressive atelectasis of the adjacent lower lobes. Persistent perihilar consolidation RIGHT lung with narrowing of the origins of the lobar bronchi. Cavitary focus RIGHT upper lobe unchanged. Extensive infiltrates throughout RIGHT upper lobe and at superior segment of RIGHT lower lobe question related to radiation therapy. Aortic Atherosclerosis (ICD10-I70.0). Electronically Signed   By: Lavonia Dana M.D.   On: 03/24/2020 16:27   DG Chest Portable 1 View  Result Date: 03/24/2020 CLINICAL DATA:  Shortness of breath.  History of lung cancer. EXAM: PORTABLE CHEST 1 VIEW COMPARISON:  November 26, 2019. FINDINGS: The heart size and mediastinal contours are within normal limits. Left lung is clear. Increased right upper lobe and right perihilar opacity is noted concerning for pneumonia. Increased right basilar atelectasis or infiltrate is noted with associated pleural effusion. No pneumothorax is noted. Interval placement of right internal jugular Port-A-Cath with distal tip in expected  position of the SVC. The visualized skeletal structures are unremarkable. IMPRESSION: Increased right upper lobe and right perihilar opacity is noted concerning for pneumonia. Increased right basilar atelectasis or infiltrate is noted with associated pleural effusion. Electronically Signed   By: Marijo Conception M.D.   On: 03/24/2020 14:52    EKG: Independently reviewed.  Patient currently in A. fib/a flutter  Assessment/Plan Active Problems:   Major depressive disorder, recurrent episode, in partial remission (HCC)   Essential hypertension   Diabetic neuropathy (HCC)   OSA (obstructive sleep apnea)   Chronic kidney disease, stage III (moderate) (HCC)   Former very heavy cigarette smoker (more than 40 per day)   Erectile dysfunction associated with type 2 diabetes mellitus (HCC)   Non-small cell carcinoma of right lung, stage 3 (HCC)   Typical atrial flutter (HCC)   Antineoplastic chemotherapy induced pancytopenia (HCC)   Paroxysmal atrial fibrillation with RVR (HCC)  A. fib RVR/a flutter -Continue Cardizem drip -Digoxin 0.5 mg x 1 -12/14 digoxin 0.25 mg daily per cardiology  -Cardiology consulted by Dr. [name], ED  Acute respiratory failure with hypoxia -Multifactorial A. fib RVR, RIGHT lung cancer -Treat underlying causes  OSA -Not using his CPAP machine -CPAP per respiratory  Non-small cell carcinoma of RIGHT lung stage III -Seen by Dr. 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  I have reviewed this patient's available data, including medical history, events of note, physical examination, and all test results as part of my evaluation.   The patient is critically ill with multiple organ systems failure and requires high complexity decision making for assessment and  support, frequent evaluation and titration of therapies, application of advanced monitoring technologies and extensive interpretation of multiple databases. Critical Care Time devoted to patient care services described in this note  Time spent: 59 minutes   Margarethe Virgen, [facility] Pager [phone]

## 2020-03-24 NOTE — ED Provider Notes (Signed)
Care transferred to me.  Patient's heart rate remains around 150 though he symptomatically feels better.  He will need to be admitted for rate control for his atrial flutter which has been ongoing for a couple weeks and he is not anticoagulated and thus not a candidate for cardioversion.  He does appear to have pleural effusions and probably needs some diuresis though no history of CHF.  He has infiltrates on the right side and states he is been coughing for 3 weeks though this could be radiation.  No fever or elevated WBC. Dr. Sherral Hammers consulted for admission. Asks for cardiology consult. I spoke to Dr. Marisue Ivan.  He has reviewed the chart and asked for dig loading.  Recommends digoxin 0.5 mg now IV and then 0.25 mg in 6 hours which would be of midnight.  Cardiology will see in the morning and consult.  Call back if no improvement or worsening.   Sherwood Gambler, MD 03/24/20 1755

## 2020-03-24 NOTE — Progress Notes (Signed)
[name] T. Toshiye Kever, MD, [place] at [facility] [place], [address]  Phone: [phone]  FAX: [phone]  [name] - 74 y.o. male  [name] [id number]  Date of Birth: 09-01-1945  Date: 03/24/2020  PCP: [name], MD  Referral: [name], MD  Chief Complaint  Patient presents with  . Hospitalization Follow-up    Pulse is high.     This visit occurred during the SARS-CoV-2 public health emergency.  Safety protocols were in place, including screening questions prior to the visit, additional usage of staff PPE, and extensive cleaning of exam room while observing appropriate contact time as indicated for disinfecting solutions.   Subjective:   [name] is a 74 y.o. very pleasant male patient who presents with the following:  Admit date: 03/11/2020 Discharge date: 03/15/2020  Recommendations for Outpatient Follow-up:  1. Follow up with PCP in 1-2 weeks 2. Please obtain BMP/CBC in one week 3. Please follow up on the following pending results:  As I walk into the room, the patient is in a wheelchair and he looks quite pale.  I know the patient quite well, he has been battling lung cancer.  He is had difficulties with severe anemia and thrombocytopenia secondary to cancer treatments.  He has had and required multiple transfusions to combat this.  This morning his hemoglobin came back at 7.2.  Oncology had been trying to set him up with outpatient transfusion.  He has felt like his pulse is been high sometimes, and he had new onset A. fib/flutter the last time he was in the hospital.  He has been seen by the A. fib clinic as well at [facility].  Rate and rhythm control has been difficult secondary to hypotension and fragility with weight loss in cancer treatments.  Pulse - 168 in the morning Has been high lately  I just spoke to the A Fib clinic He had new onset  A Flutter with symptomatic anemia I spoke to Dr. Earlie Server  Review of Systems is noted in the HPI, as appropriate No fever or chills.  He does complain of some postnasal drip.  No arthralgias, myalgias, neuro logical changes.  No vomiting or diarrhea  Patient Active Problem List   Diagnosis Date Noted  . Non-small cell carcinoma of right lung, stage 3 ([place]) 10/23/2019    Priority: High  . Uncontrolled type 2 diabetes mellitus with peripheral neuropathy ([place]) 05/11/2016    Priority: Medium  . Former very heavy cigarette smoker (more than 40 per day) 10/07/2014    Priority: Medium  . Chronic kidney disease, stage III (moderate) ([place]) 12/19/2012    Priority: Medium  . OSA (obstructive sleep apnea) 03/09/2011    Priority: Medium  . Diabetic neuropathy ([place]) 12/29/2010    Priority: Medium  . Hypertriglyceridemia 06/13/2008    Priority: Medium  . Major depressive disorder, recurrent episode, in partial remission ([place]) 06/13/2008    Priority: Medium  . Essential hypertension 06/13/2008    Priority: Medium  . Paroxysmal atrial fibrillation with RVR ([place]) 03/24/2020  . Secondary hypercoagulable state ([place]) 03/19/2020  . Typical atrial flutter ([place]) 03/11/2020  . Antineoplastic chemotherapy induced pancytopenia ([place]) 03/11/2020  . Severe protein-calorie malnutrition ([place]) 02/12/2020  . Chemotherapy induced neutropenia ([place])   . Severe sepsis ([place]) 11/22/2019  . Neutropenic fever ([place]) 11/22/2019  . Goals of care, counseling/discussion 10/23/2019  . Encounter for antineoplastic chemotherapy 10/23/2019  .  Small cell lung cancer ([place]) 10/12/2019  . Dropfoot 03/15/2019  . Mild cognitive impairment 05/08/2018  . Esophageal dysphagia   . Diabetic foot ulcer ([place]) 06/10/2017  . Erectile dysfunction associated with type 2 diabetes mellitus ([place]) 05/28/2015  . Hypogonadism male 06/14/2012  . Family history of MI (myocardial infarction) 09/16/2010  . HIP REPLACEMENT, RIGHT, HX OF 02/05/2010   . GOUT 06/13/2008  . GERD 06/13/2008  . OSTEOARTHRITIS 06/13/2008    Past Medical History:  Diagnosis Date  . Allergic rhinitis due to pollen   . Chronic kidney disease, stage III (moderate) ([place]) 12/19/2012  . Depression   . Diabetes mellitus ([place])   . Diverticulosis   . Erectile dysfunction associated with type 2 diabetes mellitus ([place]) 05/28/2015  . Former very heavy cigarette smoker (more than 40 per day) 10/07/2014  . GERD (gastroesophageal reflux disease)   . Gout   . Hyperlipidemia   . Hypertension   . Hypogonadism male 06/14/2012  . Memory loss   . Osteoarthritis   . Personal history of colonic polyps    1996  . Small cell carcinoma of right lung ([place]) 10/23/2019    Past Surgical History:  Procedure Laterality Date  . BRONCHIAL NEEDLE ASPIRATION BIOPSY  10/17/2019   Procedure: BRONCHIAL NEEDLE ASPIRATION BIOPSIES;  Surgeon: Juanito Doom, MD;  Location: [facility];  Service: Cardiopulmonary;;  . CARDIAC CATHETERIZATION  11/2011   [facility]  . CHOLECYSTECTOMY  1980  . ENDOBRONCHIAL ULTRASOUND Bilateral 10/17/2019   Procedure: ENDOBRONCHIAL ULTRASOUND;  Surgeon: Juanito Doom, MD;  Location: [facility];  Service: Cardiopulmonary;  Laterality: Bilateral;  . ESOPHAGOGASTRODUODENOSCOPY (EGD) WITH PROPOFOL N/A 09/27/2017   Procedure: ESOPHAGOGASTRODUODENOSCOPY (EGD) WITH PROPOFOL;  Surgeon: Lin Landsman, MD;  Location: [place];  Service: Gastroenterology;  Laterality: N/A;  . EYE SURGERY Right 07/29/2016   [facility] - Dr. Manuella Ghazi  . EYE SURGERY Left 08/23/2106   cat[facility]act - Dr. Manuella Ghazi  . HEMOSTASIS CONTROL  10/17/2019   Procedure: HEMOSTASIS CONTROL;  Surgeon: Juanito Doom, MD;  Location: [facility];  Service: Cardiopulmonary;;  . IR IMAGING GUIDED PORT INSERTION  11/30/2019  . IRRIGATION AND DEBRIDEMENT ABSCESS Left 11/23/2019   Procedure: IRRIGATION AND DEBRIDEMENT ABSCESS;  Surgeon: Melina Schools, MD;  Location: [facility];  Service: Orthopedics;  Laterality:  Left;  . TOTAL HIP ARTHROPLASTY  2009   Dr. Gladstone Lighter  . TOTAL HIP ARTHROPLASTY      Family History  Problem Relation Age of Onset  . Diabetes Mother   . Alzheimer's disease Mother   . Diabetes Father   . Lung cancer Father        Age 41  . Stroke Father   . Heart attack Father   . Lung cancer Sister        lung  . Heart disease Maternal Grandfather   . COPD Sister   . Heart attack Sister   . Heart disease Sister      Objective:   BP 120/76 (BP Location: Right Arm, Patient Position: Sitting)   Pulse (!) 141   Temp 98.3 F (36.8 C) (Temporal)   Ht 5\' 11"  (1.803 m)   Wt 193 lb (87.5 kg)   BMI 26.92 kg/m   GEN: Chronically ill-appearing HEENT: Atraumatic, Normocephalic. Neck supple. No masses. CV: irregular, 160 pulse PULM: CTA B, no wheezes, crackles, rhonchi. No retractions. No resp. distress. No accessory muscle use. EXTR: No c/c/tr le edema NEURO Normal gait.  PSYCH: Normally interactive. Conversant.   Laboratory and Imaging Data: Results  for orders placed or performed in visit on 03/24/20  CBC with Differential (Cancer Center Only)  Result Value Ref Range   WBC Count 7.5 4.0 - 10.5 K/uL   RBC 2.37 (L) 4.22 - 5.81 MIL/uL   Hemoglobin 7.2 (L) 13.0 - 17.0 g/dL   HCT 21.9 (L) 39.0 - 52.0 %   MCV 92.4 80.0 - 100.0 fL   MCH 30.4 26.0 - 34.0 pg   MCHC 32.9 30.0 - 36.0 g/dL   RDW 15.1 11.5 - 15.5 %   Platelet Count 127 (L) 150 - 400 K/uL   nRBC 0.0 0.0 - 0.2 %   Neutrophils Relative % 87 %   Neutro Abs 6.5 1.7 - 7.7 K/uL   Lymphocytes Relative 3 %   Lymphs Abs 0.2 (L) 0.7 - 4.0 K/uL   Monocytes Relative 9 %   Monocytes Absolute 0.6 0.1 - 1.0 K/uL   Eosinophils Relative 0 %   Eosinophils Absolute 0.0 0.0 - 0.5 K/uL   Basophils Relative 0 %   Basophils Absolute 0.0 0.0 - 0.1 K/uL   Immature Granulocytes 1 %   Abs Immature Granulocytes 0.08 (H) 0.00 - 0.07 K/uL  CMP (Cancer Center only)  Result Value Ref Range   Sodium 134 (L) 135 - 145 mmol/L   Potassium  3.7 3.5 - 5.1 mmol/L   Chloride 97 (L) 98 - 111 mmol/L   CO2 29 22 - 32 mmol/L   Glucose, Bld 114 (H) 70 - 99 mg/dL   BUN 15 8 - 23 mg/dL   Creatinine 1.27 (H) 0.61 - 1.24 mg/dL   Calcium 8.6 (L) 8.9 - 10.3 mg/dL   Total Protein 6.4 (L) 6.5 - 8.1 g/dL   Albumin 1.8 (L) 3.5 - 5.0 g/dL   AST 20 15 - 41 U/L   ALT 28 0 - 44 U/L   Alkaline Phosphatase 157 (H) 38 - 126 U/L   Total Bilirubin 0.4 0.3 - 1.2 mg/dL   GFR, Estimated 59 (L) >60 mL/min   Anion gap 8 5 - 15   *Note: Due to a large number of results and/or encounters for the requested time period, some results have not been displayed. A complete set of results can be found in Results Review.     Assessment and Plan:     ICD-10-CM   1. Paroxysmal atrial fibrillation with RVR (HCC)  I48.0 EKG 12-Lead  2. Typical atrial flutter (HCC)  I48.3 EKG 12-Lead  3. Chemotherapy induced neutropenia (HCC)  D70.1    T45.1X5A   4. Severe anemia  D64.9   5. Non-small cell carcinoma of right lung, stage 3 (HCC)  C34.91   6. Stage 3 chronic kidney disease, unspecified whether stage 3a or 3b CKD (HCC)  N18.30    Total encounter time: 40 minutes. This includes total time spent on the day of encounter.  Globally, the patient looks unwell and his pulse is at 160.  EKG: Pulse is at 160, it does appear to be regular today on EKG.  On my cardiac auscultation, I did think that it was irregular.  Hemoglobin 7.  I also spoke to the cardiology A. fib clinic as well as Dr. Earlie Server.  We are in agreement that he needs a higher level of care than outpatient setting, and I have sent the patient to [facility] emergency room his grandson is driving, and it should take less than 20 mins for them to arrive.   Also spoke with the receiving physician, and I  greatly appreciate their assistance in care.  There are no discontinued medications. Orders Placed This Encounter  Procedures  . EKG 12-Lead    Follow-up: No follow-ups on file.  Signed,  Maud Deed.  Donda Friedli, MD   Outpatient Encounter Medications as of 03/24/2020  Medication Sig  . acetaminophen (TYLENOL) 650 MG CR tablet Take 650 mg by mouth every 8 (eight) hours as needed for pain.  [name] Kitchen albuterol (VENTOLIN HFA) 108 (90 Base) MCG/ACT inhaler Inhale 1-2 puffs into the lungs every 6 (six) hours as needed for wheezing or shortness of breath.  [name] Kitchen ampicillin (PRINCIPEN) 500 MG capsule Take 500 mg by mouth daily as needed (flare up).   . Ascorbic Acid (VITAMIN C) 1000 MG tablet Take 1,000 mg by mouth daily.  . cholecalciferol (VITAMIN D) 1000 UNITS tablet Take 2,000 Units by mouth daily.  [name] Kitchen donepezil (ARICEPT) 10 MG tablet TAKE 1 TABLET BY MOUTH AT BEDTIME  . fluticasone (FLONASE) 50 MCG/ACT nasal spray PLACE 2 SPRAYS IN EACH NOSTRIL DAILY  . Hyprom-Naphaz-Polysorb-Zn Sulf (CLEAR EYES COMPLETE OP) Place 1 drop into both eyes daily as needed (dry eyes).  . Insulin Pen Needle (PEN NEEDLES) 31G X 5 MM MISC 1 each by Does not apply route 3 (three) times daily. To inject insulin E11.42  . insulin regular human CONCENTRATED (HUMULIN R U-500 KWIKPEN) 500 UNIT/ML kwikpen Inject 40 Units into the skin with breakfast, with lunch, and with evening meal.  . memantine (NAMENDA) 10 MG tablet TAKE 1 TABLET BY MOUTH TWICE A DAY  . metoprolol succinate (TOPROL-XL) 25 MG 24 hr tablet Take 2 tablets (50 mg total) by mouth daily for 30 doses.  . montelukast (SINGULAIR) 10 MG tablet Take 1 tablet (10 mg total) by mouth at bedtime.  . pantoprazole (PROTONIX) 20 MG tablet Take 1 tablet (20 mg total) by mouth daily.  . prochlorperazine (COMPAZINE) 10 MG tablet Take 1 tablet (10 mg total) by mouth every 6 (six) hours as needed.  . Semaglutide (OZEMPIC, 0.25 OR 0.5 MG/DOSE, [place]) Inject into the skin.  [name] Kitchen simvastatin (ZOCOR) 40 MG tablet TAKE 1 TABLET BY MOUTH AT BEDTIME  . venlafaxine XR (EFFEXOR-XR) 75 MG 24 hr capsule TAKE 1 CAPSULE BY MOUTH EVERY MORNING AND 2 CAPSULES AT BEDTIME   No facility-administered encounter  medications on file as of 03/24/2020.

## 2020-03-25 DIAGNOSIS — N1832 Chronic kidney disease, stage 3b: Secondary | ICD-10-CM

## 2020-03-25 DIAGNOSIS — I483 Typical atrial flutter: Secondary | ICD-10-CM

## 2020-03-25 DIAGNOSIS — R9431 Abnormal electrocardiogram [ECG] [EKG]: Secondary | ICD-10-CM | POA: Diagnosis present

## 2020-03-25 DIAGNOSIS — E0841 Diabetes mellitus due to underlying condition with diabetic mononeuropathy: Secondary | ICD-10-CM

## 2020-03-25 DIAGNOSIS — I4891 Unspecified atrial fibrillation: Secondary | ICD-10-CM

## 2020-03-25 DIAGNOSIS — R0600 Dyspnea, unspecified: Secondary | ICD-10-CM

## 2020-03-25 DIAGNOSIS — C3491 Malignant neoplasm of unspecified part of right bronchus or lung: Secondary | ICD-10-CM

## 2020-03-25 DIAGNOSIS — D649 Anemia, unspecified: Secondary | ICD-10-CM

## 2020-03-25 DIAGNOSIS — Z87891 Personal history of nicotine dependence: Secondary | ICD-10-CM

## 2020-03-25 LAB — GLUCOSE, CAPILLARY
Glucose-Capillary: 192 mg/dL — ABNORMAL HIGH (ref 70–99)
Glucose-Capillary: 220 mg/dL — ABNORMAL HIGH (ref 70–99)
Glucose-Capillary: 237 mg/dL — ABNORMAL HIGH (ref 70–99)
Glucose-Capillary: 257 mg/dL — ABNORMAL HIGH (ref 70–99)
Glucose-Capillary: 266 mg/dL — ABNORMAL HIGH (ref 70–99)

## 2020-03-25 LAB — COMPREHENSIVE METABOLIC PANEL
ALT: 24 U/L (ref 0–44)
AST: 15 U/L (ref 15–41)
Albumin: 2.2 g/dL — ABNORMAL LOW (ref 3.5–5.0)
Alkaline Phosphatase: 129 U/L — ABNORMAL HIGH (ref 38–126)
Anion gap: 10 (ref 5–15)
BUN: 12 mg/dL (ref 8–23)
CO2: 30 mmol/L (ref 22–32)
Calcium: 8.2 mg/dL — ABNORMAL LOW (ref 8.9–10.3)
Chloride: 97 mmol/L — ABNORMAL LOW (ref 98–111)
Creatinine, Ser: 1.07 mg/dL (ref 0.61–1.24)
GFR, Estimated: >60 mL/min (ref >60)
Glucose, Bld: 159 mg/dL — ABNORMAL HIGH (ref 70–99)
Potassium: 3.6 mmol/L (ref 3.5–5.1)
Sodium: 137 mmol/L (ref 135–145)
Total Bilirubin: 0.6 mg/dL (ref 0.3–1.2)
Total Protein: 6.5 g/dL (ref 6.5–8.1)

## 2020-03-25 LAB — LIPID PANEL
Cholesterol: 107 mg/dL (ref 0–200)
HDL: 31 mg/dL — ABNORMAL LOW (ref >40)
LDL Cholesterol: 61 mg/dL (ref 0–99)
Total CHOL/HDL Ratio: 3.5 RATIO
Triglycerides: 73 mg/dL (ref <150)
VLDL: 15 mg/dL (ref 0–40)

## 2020-03-25 LAB — CBG MONITORING, ED: Glucose-Capillary: 135 mg/dL — ABNORMAL HIGH (ref 70–99)

## 2020-03-25 LAB — MAGNESIUM: Magnesium: 1.6 mg/dL — ABNORMAL LOW (ref 1.7–2.4)

## 2020-03-25 LAB — TYPE AND SCREEN
ABO/RH(D): A POS
Antibody Screen: NEGATIVE
Unit division: 0

## 2020-03-25 LAB — PHOSPHORUS: Phosphorus: 3.4 mg/dL (ref 2.5–4.6)

## 2020-03-25 LAB — BPAM RBC
Blood Product Expiration Date: 202201092359
ISSUE DATE / TIME: 202112131635
Unit Type and Rh: 6200

## 2020-03-25 MED ORDER — LOPERAMIDE HCL 2 MG PO CAPS: 4.0000 mg | ORAL_CAPSULE | ORAL | Status: DC | PRN

## 2020-03-25 MED ORDER — LOPERAMIDE HCL 2 MG PO CAPS
2.0000 mg | ORAL_CAPSULE | ORAL | Status: DC | PRN
  Administered 2020-03-25 – 2020-03-27 (×2): 2 mg via ORAL
  Filled 2020-03-25 (×3): qty 1

## 2020-03-25 MED ORDER — DILTIAZEM HCL 60 MG PO TABS
60.0000 mg | ORAL_TABLET | Freq: Four times a day (QID) | ORAL | Status: DC
  Administered 2020-03-25 – 2020-03-26 (×4): 60 mg via ORAL
  Filled 2020-03-25 (×4): qty 1

## 2020-03-25 MED ORDER — MAGNESIUM SULFATE 2 GM/50ML IV SOLN
2.0000 g | Freq: Once | INTRAVENOUS | Status: AC
  Administered 2020-03-25: 12:00:00 2 g via INTRAVENOUS
  Filled 2020-03-25: qty 50

## 2020-03-25 MED ORDER — CHLORHEXIDINE GLUCONATE CLOTH 2 % EX PADS
6.0000 | MEDICATED_PAD | Freq: Every day | CUTANEOUS | Status: DC
  Administered 2020-03-25 – 2020-03-27 (×3): 6 via TOPICAL

## 2020-03-25 NOTE — Progress Notes (Addendum)
Patient has bilateral diabetic foot ulcers.   Left foot: posterior inner portion of foot Length: 1.6cm, Width: 0.6 cm, Depth: 0.02 cm Right foot: left anterior portion. Length: 1.1cm, Width: 0.6cm, Depth: 0.02cm Jerene Pitch

## 2020-03-25 NOTE — Progress Notes (Signed)
Pt. declined CPAP this admission.

## 2020-03-25 NOTE — Progress Notes (Signed)
VAST consulted to disconnect infusion to port and flush. Upon entering room, discussed with patient his wishes to use port or have it de-accessed. He complained that his IV site was hurting and he would prefer to use his port.  Flushed port with NS.  Informed pt's nurse, [place]. Requested Latrice hang new tubing, connect fluids to port, and discontinue PIV. Latrice verbalized understanding.

## 2020-03-25 NOTE — Progress Notes (Signed)
PROGRESS NOTE    [name]  [facility]:[id number] DOB: 29-Oct-1945 DOA: 03/24/2020 PCP: [name], MD     Brief Narrative:  74 y.o. 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  States has had increasing S OB x3 weeks recently admitted to hospital for S OB and A. fib/flutter.  States when he was released A. fib still was not controlled.   Subjective: 12/14 afebrile overnight, A/O x4, negative nausea, negative vomiting.  Complains of diarrhea.   Assessment & Plan: Covid vaccination; vaccinated   Active Problems:   Major depressive disorder, recurrent episode, in partial remission (HCC)   Essential hypertension   Diabetic neuropathy (HCC)   OSA (obstructive sleep apnea)   Chronic kidney disease, stage III (moderate) (HCC)   Former very heavy cigarette smoker (more than 40 per day)   Erectile dysfunction associated with type 2 diabetes mellitus (HCC)   Non-small cell carcinoma of right lung, stage 3 (HCC)   Typical atrial flutter (HCC)   Antineoplastic chemotherapy induced pancytopenia (HCC)   Paroxysmal atrial fibrillation with RVR (HCC)   Atrial fibrillation with RVR (HCC)   A. fib RVR/a flutter -Continue Cardizem drip -Digoxin 0.5 mg x 1 -12/14 digoxin 0.25 mg daily per cardiology  -Cardiology consulted by Dr. [name], ED -Cardiology concurs that patient is not safe for DCCV at this time, given he just completed chemotherapy.  Unsure if he is going to receive any more chemotherapy..  QT interval prolongation -12/14 EKG QTC> 500.  This puts patient at increased risk for torsades -Be very judicious with QT prolonging medication -DC Zofran -12/14 minimal amount of  Imodium  Acute respiratory failure with hypoxia -Multifactorial A. fib RVR, RIGHT lung cancer -Treat underlying causes  OSA -Not using his CPAP machine -CPAP per respiratory  Non-small cell carcinoma of RIGHT lung stage III -Seen by Dr. Lorna Few oncology -Active chemotherapy -States believes it was 2 months ago last dose of XRT -12/14 secure chat sent to Dr. Lorna Few oncology requesting his input into what the future treatment plan is for cancer as this will affect treatment for patient's ongoing cardiac issues.  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 -11/30 hemoglobin A1c= 8.7 -Moderate SSI  CKD stage IIIa (baseline Cr 1.21) -Avoid all nephrotoxic medications -Trend creatinine daily  Hypomagnesmia -Magnesium goal> 2 -Magnesium IV 2 g  Diarrhea -See QT interval prolongation   DVT prophylaxis: Anticoagulant held secondary to thrombocytopenia Code Status: Full Family Communication:  Status is: Inpatient    Dispo: The patient is from: Home              Anticipated d/c is to: Home              Anticipated d/c date is: 12/17              Patient currently unstable       Consultants:  Cardiology   Procedures/Significant Events:  12/1 echocardiogram;Left Ventricle: Left ventricular ejection fraction, by estimation, is 65  to 70%. The left ventricle has hyperdynamic function. The left ventricle  has no regional wall motion abnormalities. Definity contrast agent was  given IV to delineate the left  ventricular endocardial borders. The left ventricular internal cavity size  was small. There is mild  concentric left ventricular hypertrophy. Left  ventricular diastolic parameters are indeterminate.    I have personally reviewed and interpreted all radiology studies and my findings are as above.  VENTILATOR SETTINGS:    Cultures   Antimicrobials:    Devices    LINES / TUBES:      Continuous  Infusions: . diltiazem (CARDIZEM) infusion 15 mg/hr (03/25/20 0750)     Objective: Vitals:   03/25/20 0615 03/25/20 0630 03/25/20 0645 03/25/20 0730  BP: (!) 154/67 (!) 166/75 (!) 169/65 (!) 160/68  Pulse: 94 92 61 (!) 109  Resp: (!) 25 15 14 15   Temp:      TempSrc:      SpO2: 97% 95% 97% 93%  Weight:      Height:       No intake or output data in the 24 hours ending 03/25/20 0834 Filed Weights   03/24/20 1320 03/24/20 1331  Weight: 83.9 kg 83.9 kg    Examination:  General: A/O x4, No acute respiratory distress Eyes: negative scleral hemorrhage, negative anisocoria, negative icterus ENT: Negative Runny nose, negative gingival bleeding, Neck:  Negative scars, masses, torticollis, lymphadenopathy, JVD Lungs: Clear to auscultation bilaterally without wheezes or crackles Cardiovascular: Irregularly irregular rhythm and rate without murmur gallop or rub normal S1 and S2 Abdomen: negative abdominal pain, nondistended, positive soft, bowel sounds, no rebound, no ascites, no appreciable mass Extremities: No significant cyanosis, clubbing, or edema bilateral lower extremities Skin: Negative rashes, lesions, ulcers Psychiatric:  Negative depression, negative anxiety, negative fatigue, negative mania  Central nervous system:  Cranial nerves II through XII intact, tongue/uvula midline, all extremities muscle strength 5/5, sensation intact throughout, negative dysarthria, negative expressive aphasia, negative receptive aphasia.  .     Data Reviewed: Care during the described time interval was provided by me .  I have reviewed this patient's available data, including medical history, events of note, physical examination, and all test results as part of my evaluation.  CBC: Recent Labs  Lab 03/18/20 0853 03/24/20 0943  WBC 6.8 7.5  NEUTROABS 5.6 6.5  HGB 8.2* 7.2*  HCT 24.7* 21.9*  MCV 90.8 92.4  PLT 44* 885*   Basic Metabolic Panel: Recent Labs  Lab 03/18/20 0853  03/24/20 0943 03/25/20 0438  NA 134* 134* 137  K 4.2 3.7 3.6  CL 95* 97* 97*  CO2 29 29 30   GLUCOSE 370* 114* 159*  BUN 15 15 12   CREATININE 1.54* 1.27* 1.07  CALCIUM 8.9 8.6* 8.2*  MG  --   --  1.6*  PHOS  --   --  3.4   GFR: Estimated Creatinine Clearance: 64.5 mL/min (by C-G formula based on SCr of 1.07 mg/dL). Liver Function Tests: Recent Labs  Lab 03/18/20 0853 03/24/20 0943 03/25/20 0438  AST 14* 20 15  ALT 40 28 24  ALKPHOS 199* 157* 129*  BILITOT 0.6 0.4 0.6  PROT 6.5 6.4* 6.5  ALBUMIN 2.0* 1.8* 2.2*   No results for input(s): LIPASE, AMYLASE in the last 168 hours. No results for input(s): AMMONIA in the last 168 hours. Coagulation Profile: No results for input(s): INR, PROTIME in the last 168 hours. Cardiac Enzymes: No results for input(s): CKTOTAL, CKMB, CKMBINDEX, TROPONINI in the last 168 hours. BNP (last 3 results) No results for input(s): PROBNP in the last 8760 hours. HbA1C: No results for input(s): HGBA1C in the last 72 hours. CBG: Recent Labs  Lab 03/24/20 2030 03/24/20 2313 03/25/20 0410  GLUCAP 252* 243* 135*   Lipid Profile:  Recent Labs    03/25/20 0438  CHOL 107  HDL 31*  LDLCALC 61  TRIG 73  CHOLHDL 3.5   Thyroid Function Tests: Recent Labs    03/24/20 2000  TSH 1.183   Anemia Panel: No results for input(s): VITAMINB12, FOLATE, FERRITIN, TIBC, IRON, RETICCTPCT in the last 72 hours. Sepsis Labs: No results for input(s): PROCALCITON, LATICACIDVEN in the last 168 hours.  Recent Results (from the past 240 hour(s))  Resp Panel by RT-PCR (Flu A&B, Covid) Nasopharyngeal Swab     Status: None   Collection Time: 03/24/20  2:20 PM   Specimen: Nasopharyngeal Swab; Nasopharyngeal(NP) swabs in vial transport medium  Result Value Ref Range Status   SARS Coronavirus 2 by RT PCR NEGATIVE NEGATIVE Final    Comment: (NOTE) SARS-CoV-2 target nucleic acids are NOT DETECTED.  The SARS-CoV-2 RNA is generally detectable in upper  respiratory specimens during the acute phase of infection. The lowest concentration of SARS-CoV-2 viral copies this assay can detect is 138 copies/mL. A negative result does not preclude SARS-Cov-2 infection and should not be used as the sole basis for treatment or other patient management decisions. A negative result may occur with  improper specimen collection/handling, submission of specimen other than nasopharyngeal swab, presence of viral mutation(s) within the areas targeted by this assay, and inadequate number of viral copies(<138 copies/mL). A negative result must be combined with clinical observations, patient history, and epidemiological information. The expected result is Negative.  Fact Sheet for Patients:  EntrepreneurPulse.com.au  Fact Sheet for Healthcare Providers:  IncredibleEmployment.be  This test is no t yet approved or cleared by the [place] FDA and  has been authorized for detection and/or diagnosis of SARS-CoV-2 by FDA under an Emergency Use Authorization (EUA). This EUA will remain  in effect (meaning this test can be used) for the duration of the COVID-19 declaration under Section 564(b)(1) of the Act, 21 U.S.C.section 360bbb-3(b)(1), unless the authorization is terminated  or revoked sooner.       Influenza A by PCR NEGATIVE NEGATIVE Final   Influenza B by PCR NEGATIVE NEGATIVE Final    Comment: (NOTE) The Xpert Xpress SARS-CoV-2/FLU/RSV plus assay is intended as an aid in the diagnosis of influenza from Nasopharyngeal swab specimens and should not be used as a sole basis for treatment. Nasal washings and aspirates are unacceptable for Xpert Xpress SARS-CoV-2/FLU/RSV testing.  Fact Sheet for Patients: EntrepreneurPulse.com.au  Fact Sheet for Healthcare Providers: IncredibleEmployment.be  This test is not yet approved or cleared by the [place] FDA and has been  authorized for detection and/or diagnosis of SARS-CoV-2 by FDA under an Emergency Use Authorization (EUA). This EUA will remain in effect (meaning this test can be used) for the duration of the COVID-19 declaration under Section 564(b)(1) of the Act, 21 U.S.C. section 360bbb-3(b)(1), unless the authorization is terminated or revoked.  Performed at [facility], [place] [address], [place], [place] [address]          Radiology Studies: CT Angio Chest PE W and/or Wo Contrast  Result Date: 03/24/2020 CLINICAL DATA:  Tachycardia for 3 weeks, shortness of breath, intermittent chest pain, question pulmonary embolism; history of lung cancer post chemotherapy and radiation therapy, history hypertension, type II diabetes mellitus, stage III chronic kidney disease, former smoker EXAM: CT ANGIOGRAPHY CHEST WITH CONTRAST TECHNIQUE: Multidetector CT imaging of the chest was performed using the standard protocol during bolus administration of intravenous contrast. Multiplanar CT image reconstructions and MIPs were obtained to evaluate the  vascular anatomy. CONTRAST:  53mL OMNIPAQUE IOHEXOL 350 MG/ML SOLN IV COMPARISON:  CT chest 03/21/2020 FINDINGS: Cardiovascular: Atherosclerotic calcifications of aorta, proximal great vessels and coronary arteries. Aorta normal caliber without aneurysm or dissection. RIGHT jugular Port-A-Cath with tip in RIGHT atrium. Cardiac chambers unremarkable. No pericardial effusion. Pulmonary arteries adequately opacified. Few scattered respiratory motion artifacts are present, notably LEFT lower lobe. No definite pulmonary emboli identified. Mediastinum/Nodes: 12 mm nodule RIGHT thyroid lobe; not clinically significant; no follow-up imaging recommended (ref: J Am Coll Radiol. 2015 Feb;12(2): 143-50).2.2 cm RIGHT paratracheal node image 51 unchanged. Additional scattered normal size mediastinal nodes. Previously measured subcarinal node less well delineated due to  minimal infiltration. Esophagus unremarkable. Lungs/Pleura: BILATERAL pleural effusions increased from previous exam, partially loculated on RIGHT. Cavitary lesion and posterior RIGHT upper lobe again seen. Perihilar consolidation again seen. Narrowing of bronchus intermedius and proximal RIGHT middle lobe/RIGHT lower lobe bronchi. Narrowing of RIGHT upper lobe bronchus. Extensive infiltrate RIGHT upper lobe and superior segment RIGHT lower lobe unchanged likely due to radiation. Scattered areas of atelectasis little changed. Remaining LEFT lung clear. No pneumothorax. Upper Abdomen: Prior cholecystectomy. No acute upper abdominal abnormalities. Musculoskeletal: No definite osseous lesions. Scattered degenerative disc disease changes thoracic spine. Review of the MIP images confirms the above findings. IMPRESSION: No definite evidence of pulmonary embolism. Stable RIGHT paratracheal adenopathy. Increased BILATERAL pleural effusions with compressive atelectasis of the adjacent lower lobes. Persistent perihilar consolidation RIGHT lung with narrowing of the origins of the lobar bronchi. Cavitary focus RIGHT upper lobe unchanged. Extensive infiltrates throughout RIGHT upper lobe and at superior segment of RIGHT lower lobe question related to radiation therapy. Aortic Atherosclerosis (ICD10-I70.0). Electronically Signed   By: Lavonia Dana M.D.   On: 03/24/2020 16:27   DG Chest Portable 1 View  Result Date: 03/24/2020 CLINICAL DATA:  Shortness of breath.  History of lung cancer. EXAM: PORTABLE CHEST 1 VIEW COMPARISON:  November 26, 2019. FINDINGS: The heart size and mediastinal contours are within normal limits. Left lung is clear. Increased right upper lobe and right perihilar opacity is noted concerning for pneumonia. Increased right basilar atelectasis or infiltrate is noted with associated pleural effusion. No pneumothorax is noted. Interval placement of right internal jugular Port-A-Cath with distal tip in  expected position of the SVC. The visualized skeletal structures are unremarkable. IMPRESSION: Increased right upper lobe and right perihilar opacity is noted concerning for pneumonia. Increased right basilar atelectasis or infiltrate is noted with associated pleural effusion. Electronically Signed   By: Marijo Conception M.D.   On: 03/24/2020 14:52        Scheduled Meds: . insulin aspart  0-15 Units Subcutaneous Q4H   Continuous Infusions: . diltiazem (CARDIZEM) infusion 15 mg/hr (03/25/20 0750)     LOS: 1 day    Time spent:40 min    Krishiv Sandler, Geraldo Docker, MD [facility] Pager [phone]  If 7PM-7AM, please contact night-coverage [email or web address] Password [facility] 03/25/2020, 8:34 AM

## 2020-03-25 NOTE — Consult Note (Signed)
Cardiology Consultation:   Patient ID: [name]; [id number]; [birth year]   Admit date: 03/24/2020 Date of Consult: 03/25/2020  Primary Care Provider: [name], MD Primary Cardiologist: Fransico Him, MD 03/13/2020 in-hosp Primary Electrophysiologist:  None  C. [place], [facility] 03/19/2020   Patient Profile:   [name] is a 74 y.o. male with a hx of non-obstructive CAD 2013, HTN, brittle DM type 2, CKD stage 3, and SCLC s/p XRT and currently on chemotherapy c/b pancytopenia requiring blood transfusions, who is being seen today for the evaluation of Atrial fib, RVR, at the request of Dr Sherral Hammers.  History of Present Illness:   [name] was initially seen by cardiology during his 11/30-12/07/2019 admission for hyperglycemia and anemia, incidentally found atrial flutter, RVR. No anticoag possible since Plt 44, on rate control w/ Toprol XL. Prescribed 25 mg x 3 tabs qd at d/c.  Afib clinic visit 12/08, pt only taking Toprol XL 12.5 mg qd. HR not controlled. Pt to increase to Toprol XL 50 mg qd.  On the day of admission, he was seen by family medicine and his heart rate was very elevated so he was sent straight to the emergency room.  [name] only feels palpitations occasionally, possibly when his heart rate is very high.  However, he feels the atrial fibrillation is making him feel weak all the time.  He has increased dyspnea on exertion that he feels is from the atrial fibrillation as well.  He also gets occasional chest tightness.  He does not know what his heart rate is when he is feeling this, but that is also possibly related to tachycardia.  He has not been lightheaded or dizzy.  Until he got the atrial fibrillation, he was not having any chest pain.  He definitely feels the shortness of breath is worsened since he has been in atrial fibrillation.  He is aware that he was not put on blood thinners because his platelet count was only 44.   Past Medical History:   Diagnosis Date  . Allergic rhinitis due to pollen   . Chronic kidney disease, stage III (moderate) ([place]) 12/19/2012  . Depression   . Diabetes mellitus ([place])   . Diverticulosis   . Erectile dysfunction associated with type 2 diabetes mellitus ([place]) 05/28/2015  . Former very heavy cigarette smoker (more than 40 per day) 10/07/2014  . GERD (gastroesophageal reflux disease)   . Gout   . Hyperlipidemia   . Hypertension   . Hypogonadism male 06/14/2012  . Memory loss   . Osteoarthritis   . Personal history of colonic polyps    1996  . Small cell carcinoma of right lung ([place]) 10/23/2019    Past Surgical History:  Procedure Laterality Date  . BRONCHIAL NEEDLE ASPIRATION BIOPSY  10/17/2019   Procedure: BRONCHIAL NEEDLE ASPIRATION BIOPSIES;  Surgeon: Juanito Doom, MD;  Location: [facility];  Service: Cardiopulmonary;;  . CARDIAC CATHETERIZATION  11/2011   [facility]  . CHOLECYSTECTOMY  1980  . ENDOBRONCHIAL ULTRASOUND Bilateral 10/17/2019   Procedure: ENDOBRONCHIAL ULTRASOUND;  Surgeon: Juanito Doom, MD;  Location: [facility];  Service: Cardiopulmonary;  Laterality: Bilateral;  . ESOPHAGOGASTRODUODENOSCOPY (EGD) WITH PROPOFOL N/A 09/27/2017   Procedure: ESOPHAGOGASTRODUODENOSCOPY (EGD) WITH PROPOFOL;  Surgeon: Lin Landsman, MD;  Location: [place];  Service: Gastroenterology;  Laterality: N/A;  . EYE SURGERY Right 07/29/2016   cataract - Dr. Manuella Ghazi  . EYE SURGERY Left 08/23/2106   cataract - Dr. Manuella Ghazi  . HEMOSTASIS CONTROL  10/17/2019  Procedure: HEMOSTASIS CONTROL;  Surgeon: Juanito Doom, MD;  Location: [facility];  Service: Cardiopulmonary;;  . IR IMAGING GUIDED PORT INSERTION  11/30/2019  . IRRIGATION AND DEBRIDEMENT ABSCESS Left 11/23/2019   Procedure: IRRIGATION AND DEBRIDEMENT ABSCESS;  Surgeon: Melina Schools, MD;  Location: [facility];  Service: Orthopedics;  Laterality: Left;  . TOTAL HIP ARTHROPLASTY  2009   Dr. Gladstone Lighter  . TOTAL HIP ARTHROPLASTY       Prior to  Admission medications   Medication Sig Start Date End Date Taking? Authorizing Provider  acetaminophen (TYLENOL) 650 MG CR tablet Take 650 mg by mouth every 8 (eight) hours as needed for pain.   Yes [provider]  albuterol (VENTOLIN HFA) 108 (90 Base) MCG/ACT inhaler Inhale 1-2 puffs into the lungs every 6 (six) hours as needed for wheezing or shortness of breath. 02/15/20  Yes Tanner, Lyndon Code., [name]  Ascorbic Acid (VITAMIN C) 1000 MG tablet Take 1,000 mg by mouth daily.   Yes [provider]  cholecalciferol (VITAMIN D) 1000 UNITS tablet Take 2,000 Units by mouth daily.   Yes [provider]  donepezil (ARICEPT) 10 MG tablet TAKE 1 TABLET BY MOUTH AT BEDTIME Patient taking differently: Take 10 mg by mouth at bedtime. 08/24/19  Yes Copland, Frederico Hamman, MD  fluticasone (FLONASE) 50 MCG/ACT nasal spray PLACE 2 SPRAYS IN EACH NOSTRIL DAILY Patient taking differently: Place 1 spray into both nostrils daily. 01/26/19  Yes Copland, Frederico Hamman, MD  guaiFENesin (MUCINEX) 600 MG 12 hr tablet Take 600 mg by mouth daily as needed for cough.   Yes [provider]  Hyprom-Naphaz-Polysorb-Zn Sulf (CLEAR EYES COMPLETE OP) Place 1 drop into both eyes daily as needed (dry eyes).   Yes [provider]  insulin regular human CONCENTRATED (HUMULIN R U-500 KWIKPEN) 500 UNIT/ML kwikpen Inject 40 Units into the skin with breakfast, with lunch, and with evening meal.   Yes [provider]  memantine (NAMENDA) 10 MG tablet TAKE 1 TABLET BY MOUTH TWICE A DAY Patient taking differently: Take 10 mg by mouth 2 (two) times daily. 09/20/19  Yes Copland, Frederico Hamman, MD  montelukast (SINGULAIR) 10 MG tablet Take 1 tablet (10 mg total) by mouth at bedtime. 09/20/19  Yes Bedsole, Amy E, MD  pantoprazole (PROTONIX) 20 MG tablet Take 1 tablet (20 mg total) by mouth daily. 01/21/20  Yes Heilingoetter, Cassandra L, [name]  prochlorperazine (COMPAZINE) 10 MG tablet Take 1 tablet (10 mg total) by  mouth every 6 (six) hours as needed. Patient taking differently: Take 10 mg by mouth every 6 (six) hours as needed for nausea or vomiting. 10/23/19  Yes Heilingoetter, Cassandra L, [name]  Semaglutide (OZEMPIC, 0.25 OR 0.5 MG/DOSE, [place]) Inject 0.25 mg into the skin once a week. Sundays   Yes [provider]  simvastatin (ZOCOR) 40 MG tablet TAKE 1 TABLET BY MOUTH AT BEDTIME Patient taking differently: Take 40 mg by mouth daily at 6 PM. 04/30/19  Yes Copland, Frederico Hamman, MD  venlafaxine XR (EFFEXOR-XR) 75 MG 24 hr capsule TAKE 1 CAPSULE BY MOUTH EVERY MORNING AND 2 CAPSULES AT BEDTIME Patient taking differently: Take 75 mg by mouth See admin instructions. Takes 1 capsule in the morning and 2 capsules at night. 11/26/19  Yes Copland, Frederico Hamman, MD  Insulin Pen Needle (PEN NEEDLES) 31G X 5 MM MISC 1 each by Does not apply route 3 (three) times daily. To inject insulin E11.42 09/20/19   Philemon Kingdom, MD  metoprolol succinate (TOPROL-XL) 25 MG 24 hr tablet Take  2 tablets (50 mg total) by mouth daily for 30 doses. Patient not taking: No sig reported 03/19/20 04/18/20  Malka So R, PA    Inpatient Medications: Scheduled Meds: . insulin aspart  0-15 Units Subcutaneous Q4H   Continuous Infusions: . diltiazem (CARDIZEM) infusion 15 mg/hr (03/25/20 0750)  . magnesium sulfate bolus IVPB     PRN Meds: acetaminophen, ondansetron (ZOFRAN) IV  Allergies:    Allergies  Allergen Reactions  . Tetracycline Itching and Rash    Social History:   Social History   Socioeconomic History  . Marital status: Widowed    Spouse name: Not on file  . Number of children: 4  . Years of education: 12th  . Highest education level: Not on file  Occupational History  . Occupation: Counsellor  . Occupation: retired Magazine features editor: [employer]  Tobacco Use  . Smoking status: Former Smoker    Packs/day: 3.00    Years: 35.00    Pack years: 105.00    Types: Cigarettes    Quit date: 04/12/2000     Years since quitting: 19.9  . Smokeless tobacco: Never Used  Vaping Use  . Vaping Use: Never used  Substance and Sexual Activity  . Alcohol use: No  . Drug use: No  . Sexual activity: Never  Other Topics Concern  . Not on file  Social History Narrative   No regular exercise   Caffeine use: yes, dt coke   Lives at home with his mother lives with him.   Wife Fraser Din recently deceased   Right-handed.   Social Determinants of Health   Financial Resource Strain: Low Risk   . Difficulty of Paying Living Expenses: Not hard at all  Food Insecurity: No Food Insecurity  . Worried About Charity fundraiser in the Last Year: Never true  . Ran Out of Food in the Last Year: Never true  Transportation Needs: No Transportation Needs  . Lack of Transportation (Medical): No  . Lack of Transportation (Non-Medical): No  Physical Activity: Inactive  . Days of Exercise per Week: 0 days  . Minutes of Exercise per Session: 0 min  Stress: No Stress Concern Present  . Feeling of Stress : Not at all  Social Connections: Not on file  Intimate Partner Violence: Not At Risk  . Fear of Current or Ex-Partner: No  . Emotionally Abused: No  . Physically Abused: No  . Sexually Abused: No    Family History:   Family History  Problem Relation Age of Onset  . Diabetes Mother   . Alzheimer's disease Mother   . Diabetes Father   . Lung cancer Father        Age 63  . Stroke Father   . Heart attack Father   . Lung cancer Sister        lung  . Heart disease Maternal Grandfather   . COPD Sister   . Heart attack Sister   . Heart disease Sister    Family Status:  Family Status  Relation Name Status  . Mother  Alive  . Father  Deceased  . Sister  (Not Specified)  . MGF  (Not Specified)  . Sister  (Not Specified)  . Sister  (Not Specified)  . Sister  (Not Specified)    ROS:  Please see the history of present illness.  All other ROS reviewed and negative.     Physical Exam/Data:   Vitals:    03/25/20 0645 03/25/20  0730 03/25/20 0858 03/25/20 0904  BP: (!) 169/65 (!) 160/68 132/66   Pulse: 61 (!) 109 (!) 50   Resp: 14 15 14    Temp:    97.8 F (36.6 C)  TempSrc:    Oral  SpO2: 97% 93% (!) 88%   Weight:      Height:       No intake or output data in the 24 hours ending 03/25/20 1046  Last 3 Weights 03/24/2020 03/24/2020 03/24/2020  Weight (lbs) 185 lb 185 lb 193 lb  Weight (kg) 83.915 kg 83.915 kg 87.544 kg     Body mass index is 25.8 kg/m.   General:  Well nourished, well developed, male in no acute distress HEENT: normal Lymph: no adenopathy Neck: JVD -left side JVD elevated due to catheter, right side not elevated Endocrine:  No thryomegaly Vascular: No carotid bruits; 4/4 extremity pulses 2+  Cardiac:  normal S1, S2; rapid and irregular rate and rhythm; no murmur Lungs: Dense Rales on the right, basilar rales left, no wheezing, rhonchi   Abd: soft, nontender, no hepatomegaly  Ext: no edema Musculoskeletal:  No deformities, BUE and BLE strength normal and equal Skin: warm and dry  Neuro:  CNs 2-12 intact, no focal abnormalities noted Psych:  Normal affect   EKG:  The EKG was personally reviewed and demonstrates: Atrial flutter with 2 1 block, heart rate 163 Telemetry:  Telemetry was personally reviewed and demonstrates: Atrial flutter, variable AV block, heart rate generally 100 or above   CV studies:   ECHO: 03/12/20 demonstrated  1. Left ventricular ejection fraction, by estimation, is 65 to 70%. The  left ventricle has hyperdynamic function. The left ventricle has no  regional wall motion abnormalities. There is mild concentric left  ventricular hypertrophy. Left ventricular  diastolic parameters are indeterminate.  2. Right ventricular systolic function is normal. The right ventricular  size is normal. Mildly increased right ventricular wall thickness.  3. The mitral valve is grossly normal. No evidence of mitral valve  regurgitation.  4. The  aortic valve is tricuspid. There is mild calcification of the  aortic valve. Aortic valve regurgitation is not visualized.  5. The inferior vena cava is normal in size with greater than 50%  respiratory variability, suggesting right atrial pressure of 3 mmHg.  6. Left Atrium: Left atrial size was normal in size.  7. Right Atrium: Right atrial size was normal in size.   Comparison(s): A prior study was performed on 12/24/2014. No significant  change from prior study.   CATH: at [facility] 11/16/2011 Cardiac catheterization was performed which showed mild nonobstructive coronary artery disease.   Laboratory Data:   Chemistry Recent Labs  Lab 03/24/20 0943 03/25/20 0438  NA 134* 137  K 3.7 3.6  CL 97* 97*  CO2 29 30  GLUCOSE 114* 159*  BUN 15 12  CREATININE 1.27* 1.07  CALCIUM 8.6* 8.2*  GFRNONAA 59* >60  ANIONGAP 8 10    Lab Results  Component Value Date   ALT 24 03/25/2020   AST 15 03/25/2020   ALKPHOS 129 (H) 03/25/2020   BILITOT 0.6 03/25/2020   Hematology Recent Labs  Lab 03/24/20 0943  WBC 7.5  RBC 2.37*  HGB 7.2*  HCT 21.9*  MCV 92.4  MCH 30.4  MCHC 32.9  RDW 15.1  PLT 127*   Cardiac Enzymes High Sensitivity Troponin:   Recent Labs  Lab 03/24/20 1426  TROPONINIHS 16      BNP Recent Labs  Lab 03/24/20  1426  BNP 537.5*    DDimer  Recent Labs  Lab 03/24/20 1426  DDIMER 3.52*   TSH:  Lab Results  Component Value Date   TSH 1.183 03/24/2020   Lipids: Lab Results  Component Value Date   CHOL 107 03/25/2020   HDL 31 (L) 03/25/2020   LDLCALC 61 03/25/2020   LDLDIRECT 82.0 01/03/2019   TRIG 73 03/25/2020   CHOLHDL 3.5 03/25/2020   HgbA1c: Lab Results  Component Value Date   HGBA1C 8.7 (H) 03/11/2020   Magnesium:  Magnesium  Date Value Ref Range Status  03/25/2020 1.6 (L) 1.7 - 2.4 mg/dL Final    Comment:    Performed at [facility], [place] [address], [place], [place] [address]     Radiology/Studies:  CT  Angio Chest PE W and/or Wo Contrast  Result Date: 03/24/2020 CLINICAL DATA:  Tachycardia for 3 weeks, shortness of breath, intermittent chest pain, question pulmonary embolism; history of lung cancer post chemotherapy and radiation therapy, history hypertension, type II diabetes mellitus, stage III chronic kidney disease, former smoker EXAM: CT ANGIOGRAPHY CHEST WITH CONTRAST TECHNIQUE: Multidetector CT imaging of the chest was performed using the standard protocol during bolus administration of intravenous contrast. Multiplanar CT image reconstructions and MIPs were obtained to evaluate the vascular anatomy. CONTRAST:  42mL OMNIPAQUE IOHEXOL 350 MG/ML SOLN IV COMPARISON:  CT chest 03/21/2020 FINDINGS: Cardiovascular: Atherosclerotic calcifications of aorta, proximal great vessels and coronary arteries. Aorta normal caliber without aneurysm or dissection. RIGHT jugular Port-A-Cath with tip in RIGHT atrium. Cardiac chambers unremarkable. No pericardial effusion. Pulmonary arteries adequately opacified. Few scattered respiratory motion artifacts are present, notably LEFT lower lobe. No definite pulmonary emboli identified. Mediastinum/Nodes: 12 mm nodule RIGHT thyroid lobe; not clinically significant; no follow-up imaging recommended (ref: J Am Coll Radiol. 2015 Feb;12(2): 143-50).2.2 cm RIGHT paratracheal node image 51 unchanged. Additional scattered normal size mediastinal nodes. Previously measured subcarinal node less well delineated due to minimal infiltration. Esophagus unremarkable. Lungs/Pleura: BILATERAL pleural effusions increased from previous exam, partially loculated on RIGHT. Cavitary lesion and posterior RIGHT upper lobe again seen. Perihilar consolidation again seen. Narrowing of bronchus intermedius and proximal RIGHT middle lobe/RIGHT lower lobe bronchi. Narrowing of RIGHT upper lobe bronchus. Extensive infiltrate RIGHT upper lobe and superior segment RIGHT lower lobe unchanged likely due to  radiation. Scattered areas of atelectasis little changed. Remaining LEFT lung clear. No pneumothorax. Upper Abdomen: Prior cholecystectomy. No acute upper abdominal abnormalities. Musculoskeletal: No definite osseous lesions. Scattered degenerative disc disease changes thoracic spine. Review of the MIP images confirms the above findings. IMPRESSION: No definite evidence of pulmonary embolism. Stable RIGHT paratracheal adenopathy. Increased BILATERAL pleural effusions with compressive atelectasis of the adjacent lower lobes. Persistent perihilar consolidation RIGHT lung with narrowing of the origins of the lobar bronchi. Cavitary focus RIGHT upper lobe unchanged. Extensive infiltrates throughout RIGHT upper lobe and at superior segment of RIGHT lower lobe question related to radiation therapy. Aortic Atherosclerosis (ICD10-I70.0). Electronically Signed   By: Lavonia Dana M.D.   On: 03/24/2020 16:27   DG Chest Portable 1 View  Result Date: 03/24/2020 CLINICAL DATA:  Shortness of breath.  History of lung cancer. EXAM: PORTABLE CHEST 1 VIEW COMPARISON:  November 26, 2019. FINDINGS: The heart size and mediastinal contours are within normal limits. Left lung is clear. Increased right upper lobe and right perihilar opacity is noted concerning for pneumonia. Increased right basilar atelectasis or infiltrate is noted with associated pleural effusion. No pneumothorax is noted. Interval placement of right internal  jugular Port-A-Cath with distal tip in expected position of the SVC. The visualized skeletal structures are unremarkable. IMPRESSION: Increased right upper lobe and right perihilar opacity is noted concerning for pneumonia. Increased right basilar atelectasis or infiltrate is noted with associated pleural effusion. Electronically Signed   By: Marijo Conception M.D.   On: 03/24/2020 14:52    Assessment and Plan:   1.  Atrial flutter, RVR -Currently, his heart rate is approximately 100 while on Cardizem IV at 15  mg an hour -Her Toprol-XL is on hold, he was continued on the digoxin -However, he still feels weak with this and has dyspnea with minimal activity. -During his previous admission, he was not a candidate for TEE cardioversion because he could not be anticoagulated with platelets of less than 50 -His platelets have improved but he did not get a platelet transfusion -He got a transfusion last p.m., we do not have a posttransfusion CBC yet -MD advise if we could do a TEE cardioversion and transfuse as needed to keep his platelets and blood counts up for 4-6 weeks while he is anticoagulated -Check a dig level just prior to discharge  2.  SCLC -He was seen by Dr. [name] 11/22.  He had cycle #6 of carboplatin and Etoposide with Neulasta support -He has been having problems with weakness, chemo induced anemia and thrombocytopenia  Otherwise, per IM Active Problems:   Major depressive disorder, recurrent episode, in partial remission (HCC)   Essential hypertension   Diabetic neuropathy (HCC)   OSA (obstructive sleep apnea)   Chronic kidney disease, stage III (moderate) (HCC)   Former very heavy cigarette smoker (more than 40 per day)   Erectile dysfunction associated with type 2 diabetes mellitus (HCC)   Non-small cell carcinoma of right lung, stage 3 (HCC)   Typical atrial flutter (HCC)   Antineoplastic chemotherapy induced pancytopenia (HCC)   Paroxysmal atrial fibrillation with RVR (HCC)   Atrial fibrillation with RVR ([place])     For questions or updates, please contact [facility] Please consult [email or web address] for contact info under Cardiology/STEMI.   SignedRosaria Ferries, [name]  03/25/2020 10:46 AM

## 2020-03-26 ENCOUNTER — Inpatient Hospital Stay (HOSPITAL_COMMUNITY): Payer: Medicare Other

## 2020-03-26 ENCOUNTER — Telehealth: Payer: Self-pay | Admitting: Medical Oncology

## 2020-03-26 ENCOUNTER — Ambulatory Visit: Payer: Medicare Other | Admitting: Internal Medicine

## 2020-03-26 ENCOUNTER — Other Ambulatory Visit: Payer: Self-pay

## 2020-03-26 DIAGNOSIS — D61810 Antineoplastic chemotherapy induced pancytopenia: Secondary | ICD-10-CM

## 2020-03-26 LAB — CBC WITH DIFFERENTIAL/PLATELET
Abs Immature Granulocytes: 0.04 10*3/uL (ref 0.00–0.07)
Basophils Absolute: 0 10*3/uL (ref 0.0–0.1)
Basophils Relative: 0 %
Eosinophils Absolute: 0 10*3/uL (ref 0.0–0.5)
Eosinophils Relative: 0 %
HCT: 24.6 % — ABNORMAL LOW (ref 39.0–52.0)
Hemoglobin: 8.1 g/dL — ABNORMAL LOW (ref 13.0–17.0)
Immature Granulocytes: 1 %
Lymphocytes Relative: 6 %
Lymphs Abs: 0.4 10*3/uL — ABNORMAL LOW (ref 0.7–4.0)
MCH: 30.7 pg (ref 26.0–34.0)
MCHC: 32.9 g/dL (ref 30.0–36.0)
MCV: 93.2 fL (ref 80.0–100.0)
Monocytes Absolute: 0.7 10*3/uL (ref 0.1–1.0)
Monocytes Relative: 13 %
Neutro Abs: 4.3 10*3/uL (ref 1.7–7.7)
Neutrophils Relative %: 80 %
Platelets: 153 10*3/uL (ref 150–400)
RBC: 2.64 MIL/uL — ABNORMAL LOW (ref 4.22–5.81)
RDW: 15.2 % (ref 11.5–15.5)
WBC: 5.4 10*3/uL (ref 4.0–10.5)
nRBC: 0 % (ref 0.0–0.2)

## 2020-03-26 LAB — GLUCOSE, CAPILLARY
Glucose-Capillary: 249 mg/dL — ABNORMAL HIGH (ref 70–99)
Glucose-Capillary: 152 mg/dL — ABNORMAL HIGH (ref 70–99)
Glucose-Capillary: 237 mg/dL — ABNORMAL HIGH (ref 70–99)
Glucose-Capillary: 260 mg/dL — ABNORMAL HIGH (ref 70–99)
Glucose-Capillary: 252 mg/dL — ABNORMAL HIGH (ref 70–99)

## 2020-03-26 LAB — PHOSPHORUS: Phosphorus: 3.4 mg/dL (ref 2.5–4.6)

## 2020-03-26 LAB — COMPREHENSIVE METABOLIC PANEL
ALT: 18 U/L (ref 0–44)
AST: 10 U/L — ABNORMAL LOW (ref 15–41)
Albumin: 2 g/dL — ABNORMAL LOW (ref 3.5–5.0)
Alkaline Phosphatase: 112 U/L (ref 38–126)
Anion gap: 9 (ref 5–15)
BUN: 13 mg/dL (ref 8–23)
CO2: 30 mmol/L (ref 22–32)
Calcium: 7.9 mg/dL — ABNORMAL LOW (ref 8.9–10.3)
Chloride: 97 mmol/L — ABNORMAL LOW (ref 98–111)
Creatinine, Ser: 1.22 mg/dL (ref 0.61–1.24)
GFR, Estimated: >60 mL/min (ref >60)
Glucose, Bld: 167 mg/dL — ABNORMAL HIGH (ref 70–99)
Potassium: 3.2 mmol/L — ABNORMAL LOW (ref 3.5–5.1)
Sodium: 136 mmol/L (ref 135–145)
Total Bilirubin: 0.4 mg/dL (ref 0.3–1.2)
Total Protein: 5.9 g/dL — ABNORMAL LOW (ref 6.5–8.1)

## 2020-03-26 LAB — MAGNESIUM: Magnesium: 1.7 mg/dL (ref 1.7–2.4)

## 2020-03-26 MED ORDER — DILTIAZEM HCL ER COATED BEADS 240 MG PO CP24
240.0000 mg | ORAL_CAPSULE | Freq: Every day | ORAL | Status: DC
  Administered 2020-03-26 – 2020-03-27 (×2): 240 mg via ORAL
  Filled 2020-03-26 (×2): qty 1

## 2020-03-26 MED ORDER — POTASSIUM CHLORIDE CRYS ER 20 MEQ PO TBCR
40.0000 meq | EXTENDED_RELEASE_TABLET | Freq: Once | ORAL | Status: AC
  Administered 2020-03-26: 12:00:00 40 meq via ORAL
  Filled 2020-03-26: qty 2

## 2020-03-26 MED ORDER — SODIUM CHLORIDE 0.9% FLUSH
10.0000 mL | Freq: Two times a day (BID) | INTRAVENOUS | Status: DC
  Administered 2020-03-26: 10 mL

## 2020-03-26 MED ORDER — INSULIN ASPART 100 UNIT/ML ~~LOC~~ SOLN
0.0000 [IU] | Freq: Three times a day (TID) | SUBCUTANEOUS | Status: DC
  Administered 2020-03-26: 17:00:00 8 [IU] via SUBCUTANEOUS
  Administered 2020-03-27: 08:00:00 11 [IU] via SUBCUTANEOUS
  Administered 2020-03-27: 13:00:00 8 [IU] via SUBCUTANEOUS

## 2020-03-26 MED ORDER — LABETALOL HCL 5 MG/ML IV SOLN
10.0000 mg | Freq: Once | INTRAVENOUS | Status: AC
  Administered 2020-03-26: 10 mg via INTRAVENOUS
  Filled 2020-03-26: qty 4

## 2020-03-26 MED ORDER — SODIUM CHLORIDE 0.9% FLUSH
10.0000 mL | INTRAVENOUS | Status: DC | PRN
  Administered 2020-03-27: 13:00:00 10 mL

## 2020-03-26 NOTE — Plan of Care (Signed)
  Problem: Education: Goal: Ability to verbalize understanding of medication therapies will improve Outcome: Progressing   Problem: Education: Goal: Knowledge of General Education information will improve Description: Including pain rating scale, medication(s)/side effects and non-pharmacologic comfort measures Outcome: Progressing   Problem: Elimination: Goal: Will not experience complications related to bowel motility Outcome: Progressing   Problem: Education: Goal: Ability to demonstrate management of disease process will improve Outcome: Not Progressing   Problem: Activity: Goal: Capacity to carry out activities will improve Outcome: Not Progressing   Problem: Cardiac: Goal: Ability to achieve and maintain adequate cardiopulmonary perfusion will improve Outcome: Not Progressing   Problem: Health Behavior/Discharge Planning: Goal: Ability to manage health-related needs will improve Outcome: Not Progressing   Problem: Clinical Measurements: Goal: Ability to maintain clinical measurements within normal limits will improve Outcome: Not Progressing Goal: Will remain free from infection Outcome: Not Progressing Goal: Diagnostic test results will improve Outcome: Not Progressing Goal: Respiratory complications will improve Outcome: Not Progressing Goal: Cardiovascular complication will be avoided Outcome: Not Progressing   Problem: Activity: Goal: Risk for activity intolerance will decrease Outcome: Not Progressing   Problem: Coping: Goal: Level of anxiety will decrease Outcome: Not Progressing   Problem: Elimination: Goal: Will not experience complications related to urinary retention Outcome: Not Progressing   Problem: Safety: Goal: Ability to remain free from injury will improve Outcome: Not Progressing   Problem: Skin Integrity: Goal: Risk for impaired skin integrity will decrease Outcome: Not Progressing

## 2020-03-26 NOTE — Progress Notes (Signed)
DAILY PROGRESS NOTE   Patient Name: [name] Date of Encounter: 03/26/2020 Cardiologist: Fransico Him, MD  Chief Complaint   No complaints.  Patient Profile   [name] is a 74 y.o. male with a hx of non-obstructive CAD 2013, HTN, brittle DM type 2, CKD stage 3, and SCLC s/p XRT and currently on chemotherapy c/b pancytopenia requiring blood transfusions, who is being seen today for the evaluation of Atrial fib, RVR, at the request of Dr Sherral Hammers.  Subjective   Remains in rate-controlled afib - not clear if oncology will support anticoagulation at this time given his recent issues with anemia/thrombocytopenia.  Objective   Vitals:   03/26/20 0319 03/26/20 0419 03/26/20 0600 03/26/20 0700  BP:  135/79    Pulse:  84    Resp:  16 17 15   Temp:  98.8 F (37.1 C)    TempSrc:  Oral    SpO2: (!) 60% 92%    Weight:  84.2 kg    Height:        Intake/Output Summary (Last 24 hours) at 03/26/2020 1110 Last data filed at 03/25/2020 1818 Gross per 24 hour  Intake 394.25 ml  Output --  Net 394.25 ml   Filed Weights   03/24/20 1320 03/24/20 1331 03/26/20 0419  Weight: 83.9 kg 83.9 kg 84.2 kg    Physical Exam   General appearance: alert and no distress Neck: no carotid bruit, no JVD and thyroid not enlarged, symmetric, no tenderness/mass/nodules Lungs: clear to auscultation bilaterally Heart: regular rate and rhythm Abdomen: soft, non-tender; bowel sounds normal; no masses,  no organomegaly Extremities: extremities normal, atraumatic, no cyanosis or edema Pulses: 2+ and symmetric Skin: Skin color, texture, turgor normal. No rashes or lesions Neurologic: Grossly normal Psych: Pleasant  Inpatient Medications    Scheduled Meds: . Chlorhexidine Gluconate Cloth  6 each Topical Daily  . diltiazem  60 mg Oral Q6H  . insulin aspart  0-15 Units Subcutaneous Q4H  . sodium chloride flush  10-40 mL Intracatheter Q12H    Continuous Infusions:   PRN  Meds: acetaminophen, loperamide, sodium chloride flush   Labs   Results for orders placed or performed during the hospital encounter of 03/24/20 (from the past 48 hour(s))  Type and screen [place]     Status: None   Collection Time: 03/24/20  2:05 PM  Result Value Ref Range   ABO/RH(D) A POS    Antibody Screen NEG    Sample Expiration 03/27/2020,2359    Unit Number [id number]    Blood Component Type RED CELLS,LR    Unit division 00    Status of Unit ISSUED,FINAL    Transfusion Status OK TO TRANSFUSE    Crossmatch Result      Compatible Performed at [facility], [place] [address] [place], [place] [address]   Prepare RBC (crossmatch)     Status: None   Collection Time: 03/24/20  2:05 PM  Result Value Ref Range   Order Confirmation      ORDER PROCESSED BY BLOOD BANK Performed at [facility], [place] [address], [place], [place] [address]   Resp Panel by RT-PCR (Flu A&B, Covid) Nasopharyngeal Swab     Status: None   Collection Time: 03/24/20  2:20 PM   Specimen: Nasopharyngeal Swab; Nasopharyngeal(NP) swabs in vial transport medium  Result Value Ref Range   SARS Coronavirus 2 by RT PCR NEGATIVE NEGATIVE    Comment: (NOTE) SARS-CoV-2 target nucleic acids are  NOT DETECTED.  The SARS-CoV-2 RNA is generally detectable in upper respiratory specimens during the acute phase of infection. The lowest concentration of SARS-CoV-2 viral copies this assay can detect is 138 copies/mL. A negative result does not preclude SARS-Cov-2 infection and should not be used as the sole basis for treatment or other patient management decisions. A negative result may occur with  improper specimen collection/handling, submission of specimen other than nasopharyngeal swab, presence of viral mutation(s) within the areas targeted by this assay, and inadequate number of viral copies(<138 copies/mL). A negative result must be combined with clinical  observations, patient history, and epidemiological information. The expected result is Negative.  Fact Sheet for Patients:  EntrepreneurPulse.com.au  Fact Sheet for Healthcare Providers:  IncredibleEmployment.be  This test is no t yet approved or cleared by the [place] FDA and  has been authorized for detection and/or diagnosis of SARS-CoV-2 by FDA under an Emergency Use Authorization (EUA). This EUA will remain  in effect (meaning this test can be used) for the duration of the COVID-19 declaration under Section 564(b)(1) of the Act, 21 U.S.C.section 360bbb-3(b)(1), unless the authorization is terminated  or revoked sooner.       Influenza A by PCR NEGATIVE NEGATIVE   Influenza B by PCR NEGATIVE NEGATIVE    Comment: (NOTE) The Xpert Xpress SARS-CoV-2/FLU/RSV plus assay is intended as an aid in the diagnosis of influenza from Nasopharyngeal swab specimens and should not be used as a sole basis for treatment. Nasal washings and aspirates are unacceptable for Xpert Xpress SARS-CoV-2/FLU/RSV testing.  Fact Sheet for Patients: EntrepreneurPulse.com.au  Fact Sheet for Healthcare Providers: IncredibleEmployment.be  This test is not yet approved or cleared by the [place] FDA and has been authorized for detection and/or diagnosis of SARS-CoV-2 by FDA under an Emergency Use Authorization (EUA). This EUA will remain in effect (meaning this test can be used) for the duration of the COVID-19 declaration under Section 564(b)(1) of the Act, 21 U.S.C. section 360bbb-3(b)(1), unless the authorization is terminated or revoked.  Performed at [facility], [place] [address], [place], [place] [address]   Troponin I (High Sensitivity)     Status: None   Collection Time: 03/24/20  2:26 PM  Result Value Ref Range   Troponin I (High Sensitivity) 16 <18 ng/L    Comment: (NOTE) Elevated high  sensitivity troponin I (hsTnI) values and significant  changes across serial measurements may suggest ACS but many other  chronic and acute conditions are known to elevate hsTnI results.  Refer to the "Links" section for chest pain algorithms and additional  guidance. Performed at [facility], [place] [address], [place], [place] [address]   Brain natriuretic peptide     Status: Abnormal   Collection Time: 03/24/20  2:26 PM  Result Value Ref Range   B Natriuretic Peptide 537.5 (H) 0.0 - 100.0 pg/mL    Comment: Performed at [facility], [place] [address] [place], [place] [address]  D-dimer, quantitative (not at [facility])     Status: Abnormal   Collection Time: 03/24/20  2:26 PM  Result Value Ref Range   D-Dimer, Quant 3.52 (H) 0.00 - 0.50 ug/mL-FEU    Comment: (NOTE) At the manufacturer cut-off value of 0.5 g/mL FEU, this assay has a negative predictive value of 95-100%.This assay is intended for use in conjunction with a clinical pretest probability (PTP) assessment model to exclude pulmonary embolism (PE) and deep venous thrombosis (DVT) in outpatients suspected of PE or  DVT. Results should be correlated with clinical presentation. Performed at [facility], [place] [address], [place], [place] [address]   TSH     Status: None   Collection Time: 03/24/20  8:00 PM  Result Value Ref Range   TSH 1.183 0.350 - 4.500 uIU/mL    Comment: Performed by a 3rd Generation assay with a functional sensitivity of <=0.01 uIU/mL. Performed at [facility], [place] [address] [place], [place] [address]   CBG monitoring, ED     Status: Abnormal   Collection Time: 03/24/20  8:30 PM  Result Value Ref Range   Glucose-Capillary 252 (H) 70 - 99 mg/dL    Comment: Glucose reference range applies only to samples taken after fasting for at least 8 hours.  CBG monitoring, ED     Status: Abnormal   Collection Time: 03/24/20 11:13 PM  Result  Value Ref Range   Glucose-Capillary 243 (H) 70 - 99 mg/dL    Comment: Glucose reference range applies only to samples taken after fasting for at least 8 hours.  CBG monitoring, ED     Status: Abnormal   Collection Time: 03/25/20  4:10 AM  Result Value Ref Range   Glucose-Capillary 135 (H) 70 - 99 mg/dL    Comment: Glucose reference range applies only to samples taken after fasting for at least 8 hours.  Comprehensive metabolic panel     Status: Abnormal   Collection Time: 03/25/20  4:38 AM  Result Value Ref Range   Sodium 137 135 - 145 mmol/L   Potassium 3.6 3.5 - 5.1 mmol/L   Chloride 97 (L) 98 - 111 mmol/L   CO2 30 22 - 32 mmol/L   Glucose, Bld 159 (H) 70 - 99 mg/dL    Comment: Glucose reference range applies only to samples taken after fasting for at least 8 hours.   BUN 12 8 - 23 mg/dL   Creatinine, Ser 1.07 0.61 - 1.24 mg/dL   Calcium 8.2 (L) 8.9 - 10.3 mg/dL   Total Protein 6.5 6.5 - 8.1 g/dL   Albumin 2.2 (L) 3.5 - 5.0 g/dL   AST 15 15 - 41 U/L   ALT 24 0 - 44 U/L   Alkaline Phosphatase 129 (H) 38 - 126 U/L   Total Bilirubin 0.6 0.3 - 1.2 mg/dL   GFR, Estimated >60 >60 mL/min    Comment: (NOTE) Calculated using the CKD-EPI Creatinine Equation (2021)    Anion gap 10 5 - 15    Comment: Performed at [facility], [place] [address] [place], [place] [address]  Magnesium     Status: Abnormal   Collection Time: 03/25/20  4:38 AM  Result Value Ref Range   Magnesium 1.6 (L) 1.7 - 2.4 mg/dL    Comment: Performed at [facility], [place] [address] [place], [place] [address]  Lipid panel     Status: Abnormal   Collection Time: 03/25/20  4:38 AM  Result Value Ref Range   Cholesterol 107 0 - 200 mg/dL   Triglycerides 73 <150 mg/dL   HDL 31 (L) >40 mg/dL   Total CHOL/HDL Ratio 3.5 RATIO   VLDL 15 0 - 40 mg/dL   LDL Cholesterol 61 0 - 99 mg/dL    Comment:        Total Cholesterol/HDL:CHD Risk Coronary Heart Disease Risk Table                      Men  Women  1/2 Average Risk   3.4   3.3  Average Risk       5.0   4.4  2 X Average Risk   9.6   7.1  3 X Average Risk  23.4   11.0        Use the calculated Patient Ratio above and the CHD Risk Table to determine the patient's CHD Risk.        ATP III CLASSIFICATION (LDL):  <100     mg/dL   Optimal  100-129  mg/dL   Near or Above                    Optimal  130-159  mg/dL   Borderline  160-189  mg/dL   High  >190     mg/dL   Very High Performed at [place] [address], [place], [place] [address]   Phosphorus     Status: None   Collection Time: 03/25/20  4:38 AM  Result Value Ref Range   Phosphorus 3.4 2.5 - 4.6 mg/dL    Comment: Performed at [facility], [place] [address], [place], [place] [address]  Glucose, capillary     Status: Abnormal   Collection Time: 03/25/20 10:50 AM  Result Value Ref Range   Glucose-Capillary 220 (H) 70 - 99 mg/dL    Comment: Glucose reference range applies only to samples taken after fasting for at least 8 hours.  Glucose, capillary     Status: Abnormal   Collection Time: 03/25/20  1:02 PM  Result Value Ref Range   Glucose-Capillary 257 (H) 70 - 99 mg/dL    Comment: Glucose reference range applies only to samples taken after fasting for at least 8 hours.  Glucose, capillary     Status: Abnormal   Collection Time: 03/25/20  4:44 PM  Result Value Ref Range   Glucose-Capillary 266 (H) 70 - 99 mg/dL    Comment: Glucose reference range applies only to samples taken after fasting for at least 8 hours.  Glucose, capillary     Status: Abnormal   Collection Time: 03/25/20  8:53 PM  Result Value Ref Range   Glucose-Capillary 192 (H) 70 - 99 mg/dL    Comment: Glucose reference range applies only to samples taken after fasting for at least 8 hours.  Glucose, capillary     Status: Abnormal   Collection Time: 03/25/20 11:44 PM  Result Value Ref Range   Glucose-Capillary 237 (H) 70 - 99 mg/dL    Comment:  Glucose reference range applies only to samples taken after fasting for at least 8 hours.  Glucose, capillary     Status: Abnormal   Collection Time: 03/26/20  4:16 AM  Result Value Ref Range   Glucose-Capillary 249 (H) 70 - 99 mg/dL    Comment: Glucose reference range applies only to samples taken after fasting for at least 8 hours.  Comprehensive metabolic panel     Status: Abnormal   Collection Time: 03/26/20  6:40 AM  Result Value Ref Range   Sodium 136 135 - 145 mmol/L   Potassium 3.2 (L) 3.5 - 5.1 mmol/L   Chloride 97 (L) 98 - 111 mmol/L   CO2 30 22 - 32 mmol/L   Glucose, Bld 167 (H) 70 - 99 mg/dL    Comment: Glucose reference range applies only to samples taken after fasting for at least 8 hours.   BUN 13 8 - 23 mg/dL   Creatinine,  Ser 1.22 0.61 - 1.24 mg/dL   Calcium 7.9 (L) 8.9 - 10.3 mg/dL   Total Protein 5.9 (L) 6.5 - 8.1 g/dL   Albumin 2.0 (L) 3.5 - 5.0 g/dL   AST 10 (L) 15 - 41 U/L   ALT 18 0 - 44 U/L   Alkaline Phosphatase 112 38 - 126 U/L   Total Bilirubin 0.4 0.3 - 1.2 mg/dL   GFR, Estimated >60 >60 mL/min    Comment: (NOTE) Calculated using the CKD-EPI Creatinine Equation (2021)    Anion gap 9 5 - 15    Comment: Performed at [facility], [place] [address], [place], [place] [address]  Magnesium     Status: None   Collection Time: 03/26/20  6:40 AM  Result Value Ref Range   Magnesium 1.7 1.7 - 2.4 mg/dL    Comment: Performed at [facility], [place] [address], [place], [place] [address]  Phosphorus     Status: None   Collection Time: 03/26/20  6:40 AM  Result Value Ref Range   Phosphorus 3.4 2.5 - 4.6 mg/dL    Comment: Performed at [facility], [place] [address], [place], [place] [address]  CBC with Differential/Platelet     Status: Abnormal   Collection Time: 03/26/20  6:40 AM  Result Value Ref Range   WBC 5.4 4.0 - 10.5 K/uL   RBC 2.64 (L) 4.22 - 5.81 MIL/uL   Hemoglobin 8.1 (L) 13.0 - 17.0 g/dL   HCT  24.6 (L) 39.0 - 52.0 %   MCV 93.2 80.0 - 100.0 fL   MCH 30.7 26.0 - 34.0 pg   MCHC 32.9 30.0 - 36.0 g/dL   RDW 15.2 11.5 - 15.5 %   Platelets 153 150 - 400 K/uL   nRBC 0.0 0.0 - 0.2 %   Neutrophils Relative % 80 %   Neutro Abs 4.3 1.7 - 7.7 K/uL   Lymphocytes Relative 6 %   Lymphs Abs 0.4 (L) 0.7 - 4.0 K/uL   Monocytes Relative 13 %   Monocytes Absolute 0.7 0.1 - 1.0 K/uL   Eosinophils Relative 0 %   Eosinophils Absolute 0.0 0.0 - 0.5 K/uL   Basophils Relative 0 %   Basophils Absolute 0.0 0.0 - 0.1 K/uL   Immature Granulocytes 1 %   Abs Immature Granulocytes 0.04 0.00 - 0.07 K/uL    Comment: Performed at [facility], [place] [address], [place], [place] [address]  Glucose, capillary     Status: Abnormal   Collection Time: 03/26/20  7:34 AM  Result Value Ref Range   Glucose-Capillary 152 (H) 70 - 99 mg/dL    Comment: Glucose reference range applies only to samples taken after fasting for at least 8 hours.   *Note: Due to a large number of results and/or encounters for the requested time period, some results have not been displayed. A complete set of results can be found in Results Review.    ECG   N/A  Telemetry   Afib around 100 - Personally Reviewed  Radiology    CT Angio Chest PE W and/or Wo Contrast  Result Date: 03/24/2020 CLINICAL DATA:  Tachycardia for 3 weeks, shortness of breath, intermittent chest pain, question pulmonary embolism; history of lung cancer post chemotherapy and radiation therapy, history hypertension, type II diabetes mellitus, stage III chronic kidney disease, former smoker EXAM: CT ANGIOGRAPHY CHEST WITH CONTRAST TECHNIQUE: Multidetector CT imaging of the chest was performed using the standard protocol during bolus  administration of intravenous contrast. Multiplanar CT image reconstructions and MIPs were obtained to evaluate the vascular anatomy. CONTRAST:  47mL OMNIPAQUE IOHEXOL 350 MG/ML SOLN IV COMPARISON:  CT chest 03/21/2020  FINDINGS: Cardiovascular: Atherosclerotic calcifications of aorta, proximal great vessels and coronary arteries. Aorta normal caliber without aneurysm or dissection. RIGHT jugular Port-A-Cath with tip in RIGHT atrium. Cardiac chambers unremarkable. No pericardial effusion. Pulmonary arteries adequately opacified. Few scattered respiratory motion artifacts are present, notably LEFT lower lobe. No definite pulmonary emboli identified. Mediastinum/Nodes: 12 mm nodule RIGHT thyroid lobe; not clinically significant; no follow-up imaging recommended (ref: J Am Coll Radiol. 2015 Feb;12(2): 143-50).2.2 cm RIGHT paratracheal node image 51 unchanged. Additional scattered normal size mediastinal nodes. Previously measured subcarinal node less well delineated due to minimal infiltration. Esophagus unremarkable. Lungs/Pleura: BILATERAL pleural effusions increased from previous exam, partially loculated on RIGHT. Cavitary lesion and posterior RIGHT upper lobe again seen. Perihilar consolidation again seen. Narrowing of bronchus intermedius and proximal RIGHT middle lobe/RIGHT lower lobe bronchi. Narrowing of RIGHT upper lobe bronchus. Extensive infiltrate RIGHT upper lobe and superior segment RIGHT lower lobe unchanged likely due to radiation. Scattered areas of atelectasis little changed. Remaining LEFT lung clear. No pneumothorax. Upper Abdomen: Prior cholecystectomy. No acute upper abdominal abnormalities. Musculoskeletal: No definite osseous lesions. Scattered degenerative disc disease changes thoracic spine. Review of the MIP images confirms the above findings. IMPRESSION: No definite evidence of pulmonary embolism. Stable RIGHT paratracheal adenopathy. Increased BILATERAL pleural effusions with compressive atelectasis of the adjacent lower lobes. Persistent perihilar consolidation RIGHT lung with narrowing of the origins of the lobar bronchi. Cavitary focus RIGHT upper lobe unchanged. Extensive infiltrates throughout RIGHT  upper lobe and at superior segment of RIGHT lower lobe question related to radiation therapy. Aortic Atherosclerosis (ICD10-I70.0). Electronically Signed   By: Lavonia Dana M.D.   On: 03/24/2020 16:27   DG Chest Portable 1 View  Result Date: 03/24/2020 CLINICAL DATA:  Shortness of breath.  History of lung cancer. EXAM: PORTABLE CHEST 1 VIEW COMPARISON:  November 26, 2019. FINDINGS: The heart size and mediastinal contours are within normal limits. Left lung is clear. Increased right upper lobe and right perihilar opacity is noted concerning for pneumonia. Increased right basilar atelectasis or infiltrate is noted with associated pleural effusion. No pneumothorax is noted. Interval placement of right internal jugular Port-A-Cath with distal tip in expected position of the SVC. The visualized skeletal structures are unremarkable. IMPRESSION: Increased right upper lobe and right perihilar opacity is noted concerning for pneumonia. Increased right basilar atelectasis or infiltrate is noted with associated pleural effusion. Electronically Signed   By: Marijo Conception M.D.   On: 03/24/2020 14:52    Cardiac Studies   N/A  Assessment   1. Active Problems: 2.   Major depressive disorder, recurrent episode, in partial remission ([place]) 3.   Essential hypertension 4.   Diabetic neuropathy ([place]) 5.   OSA (obstructive sleep apnea) 6.   Chronic kidney disease, stage III (moderate) (HCC) 7.   Former very heavy cigarette smoker (more than 40 per day) 8.   Erectile dysfunction associated with type 2 diabetes mellitus ([place]) 9.   Non-small cell carcinoma of right lung, stage 3 ([place]) 10.   Typical atrial flutter ([place]) 11.   Antineoplastic chemotherapy induced pancytopenia ([place]) 12.   Paroxysmal atrial fibrillation with RVR (HCC) 13.   Atrial fibrillation with RVR ([place]) 14.   Prolonged QT interval 15.   Plan   1. HR has been stable overnight - would continue current plan  with rate control given the inability to  anticoagulate at this time. Will transition to long-acting diltiazem CD 240 mg daily today. No further suggestions at this time. Can follow-up with Dr. Radford Pax after discharge.  [facility] will sign off.   Medication Recommendations:  cardizem CD 240 mg daily Other recommendations (labs, testing, etc):  none Follow up as an outpatient:  Dr. Radford Pax or APP in 2-3 weeks  Time Spent Directly with Patient:  I have spent a total of 25 minutes with the patient reviewing hospital notes, telemetry, EKGs, labs and examining the patient as well as establishing an assessment and plan that was discussed personally with the patient.  > 50% of time was spent in direct patient care.  Length of Stay:  LOS: 2 days   Pixie Casino, MD, [facility], [place] Director of the Advanced Lipid Disorders &  Cardiovascular Risk Reduction Clinic Diplomate of the American Board of Clinical Lipidology Attending Cardiologist  Direct Dial: [phone]  Fax: [phone]  Website:  [email or web address] 03/26/2020, 11:10 AM

## 2020-03-26 NOTE — Progress Notes (Signed)
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  He was admitted with A. fib RVR/a flutter.  He was started on Cardizem drip and cardiology consulted.  New events last 24 hours / Subjective: No new complaints on my examination, however did tell the cardiologist that he was having some productive cough today.  Assessment & Plan:   Active Problems:   Major depressive disorder, recurrent episode, in partial remission (HCC)   Essential hypertension   Diabetic neuropathy (HCC)   OSA (obstructive sleep apnea)   Chronic kidney disease, stage III (moderate) (HCC)   Former very heavy cigarette smoker (more than 40 per day)   Erectile dysfunction associated with type 2 diabetes mellitus (HCC)   Non-small cell carcinoma of right lung, stage 3 (HCC)   Typical atrial flutter (HCC)   Antineoplastic chemotherapy induced pancytopenia (HCC)   Paroxysmal atrial fibrillation with RVR (HCC)   Atrial fibrillation with RVR (HCC)   Prolonged QT interval    A. fib RVR/a flutter -Not a candidate for cardioversion at this time; patient would need to be anticoagulated for at least 3 weeks without any evidence of anemia/thrombocytopenia before consideration  -Appreciate cardiology; signed off 12/15 -Follow up with Dr. Radford Pax or APP in 2-3 weeks  -Continue cardizem daily   QT interval prolongation -Repeat EKG   Acute hypoxemic respiratory failure -Multifactorial with A. 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 Earlie Server  Diabetes mellitus type 2, uncontrolled with hyperglycemia, diabetic nephropathy, diabetic foot wounds -Hemoglobin A1c 8.7 -Sliding scale insulin  CKD stage IIIa -Baseline creatinine 1.21 -Stable   OSA -CPAP nightly  Diabetic foot wounds -WOC RN consult   Hypokalemia -Replace, trend    DVT prophylaxis:  SCDs Start: 03/24/20 1825  Code Status: Full code Family Communication: No family at bedside Disposition Plan:  Status is: Inpatient  Remains inpatient appropriate because:Unsafe d/c plan and Inpatient level of care appropriate due to severity of illness   Dispo: The patient is from: Home              Anticipated d/c is to: To be determined, PT evaluation pending              Anticipated d/c date is: 1 day              Patient currently is not medically stable to d/c.  Continue to wean oxygen.  PT evaluation today     Consultants:   Cardiology  Procedures:   None  Antimicrobials:  Anti-infectives (From admission, onward)   None        Objective: Vitals:   03/26/20 0419 03/26/20 0600 03/26/20 0700 03/26/20 1214  BP: 135/79   138/74  Pulse: 84     Resp: 16 17 15    Temp: 98.8 F (37.1 C)     TempSrc: Oral     SpO2: 92%     Weight: 84.2 kg     Height:        Intake/Output Summary (  Last 24 hours) at 03/26/2020 1232 Last data filed at 03/25/2020 1818 Gross per 24 hour  Intake 394.25 ml  Output --  Net 394.25 ml   Filed Weights   03/24/20 1320 03/24/20 1331 03/26/20 0419  Weight: 83.9 kg 83.9 kg 84.2 kg    Examination:  General exam: Appears calm and comfortable  Respiratory system: Clear to auscultation. Respiratory effort normal. No respiratory distress. No conversational dyspnea.  Remains on 2 L oxygen Cardiovascular system: S1 & S2 heard, irregular rhythm, rate 100s. No murmurs. No pedal edema. Gastrointestinal system: Abdomen is nondistended, soft and  nontender. Normal bowel sounds heard. Central nervous system: Alert and oriented. No focal neurological deficits. Speech clear.  Extremities: Symmetric in appearance  Skin: No rashes, lesions or ulcers on exposed skin  Psychiatry: Judgement and insight appear normal. Mood & affect appropriate.   Data Reviewed: I have personally reviewed following labs and imaging studies  CBC: Recent Labs  Lab 03/24/20 0943 03/26/20 0640  WBC 7.5 5.4  NEUTROABS 6.5 4.3  HGB 7.2* 8.1*  HCT 21.9* 24.6*  MCV 92.4 93.2  PLT 127* 893   Basic Metabolic Panel: Recent Labs  Lab 03/24/20 0943 03/25/20 0438 03/26/20 0640  NA 134* 137 136  K 3.7 3.6 3.2*  CL 97* 97* 97*  CO2 29 30 30   GLUCOSE 114* 159* 167*  BUN 15 12 13   CREATININE 1.27* 1.07 1.22  CALCIUM 8.6* 8.2* 7.9*  MG  --  1.6* 1.7  PHOS  --  3.4 3.4   GFR: Estimated Creatinine Clearance: 56.6 mL/min (by C-G formula based on SCr of 1.22 mg/dL). Liver Function Tests: Recent Labs  Lab 03/24/20 0943 03/25/20 0438 03/26/20 0640  AST 20 15 10*  ALT 28 24 18   ALKPHOS 157* 129* 112  BILITOT 0.4 0.6 0.4  PROT 6.4* 6.5 5.9*  ALBUMIN 1.8* 2.2* 2.0*   No results for input(s): LIPASE, AMYLASE in the last 168 hours. No results for input(s): AMMONIA in the last 168 hours. Coagulation Profile: No results for input(s): INR, PROTIME in the last 168 hours. Cardiac Enzymes: No results for input(s): CKTOTAL, CKMB, CKMBINDEX, TROPONINI in the last 168 hours. BNP (last 3 results) No results for input(s): PROBNP in the last 8760 hours. HbA1C: No results for input(s): HGBA1C in the last 72 hours. CBG: Recent Labs  Lab 03/25/20 2053 03/25/20 2344 03/26/20 0416 03/26/20 0734 03/26/20 1136  GLUCAP 192* 237* 249* 152* 237*   Lipid Profile: Recent Labs    03/25/20 0438  CHOL 107  HDL 31*  LDLCALC 61  TRIG 73  CHOLHDL 3.5   Thyroid Function Tests: Recent Labs    03/24/20 2000  TSH 1.183   Anemia Panel: No results for input(s):  VITAMINB12, FOLATE, FERRITIN, TIBC, IRON, RETICCTPCT in the last 72 hours. Sepsis Labs: No results for input(s): PROCALCITON, LATICACIDVEN in the last 168 hours.  Recent Results (from the past 240 hour(s))  Resp Panel by RT-PCR (Flu A&B, Covid) Nasopharyngeal Swab     Status: None   Collection Time: 03/24/20  2:20 PM   Specimen: Nasopharyngeal Swab; Nasopharyngeal(NP) swabs in vial transport medium  Result Value Ref Range Status   SARS Coronavirus 2 by RT PCR NEGATIVE NEGATIVE Final    Comment: (NOTE) SARS-CoV-2 target nucleic acids are NOT DETECTED.  The SARS-CoV-2 RNA is generally detectable in upper respiratory specimens during the acute phase of infection. The lowest concentration of SARS-CoV-2 viral copies this assay can detect is 138 copies/mL. A negative result does  not preclude SARS-Cov-2 infection and should not be used as the sole basis for treatment or other patient management decisions. A negative result may occur with  improper specimen collection/handling, submission of specimen other than nasopharyngeal swab, presence of viral mutation(s) within the areas targeted by this assay, and inadequate number of viral copies(<138 copies/mL). A negative result must be combined with clinical observations, patient history, and epidemiological information. The expected result is Negative.  Fact Sheet for Patients:  EntrepreneurPulse.com.au  Fact Sheet for Healthcare Providers:  IncredibleEmployment.be  This test is no t yet approved or cleared by the [place] FDA and  has been authorized for detection and/or diagnosis of SARS-CoV-2 by FDA under an Emergency Use Authorization (EUA). This EUA will remain  in effect (meaning this test can be used) for the duration of the COVID-19 declaration under Section 564(b)(1) of the Act, 21 U.S.C.section 360bbb-3(b)(1), unless the authorization is terminated  or revoked sooner.       Influenza A  by PCR NEGATIVE NEGATIVE Final   Influenza B by PCR NEGATIVE NEGATIVE Final    Comment: (NOTE) The Xpert Xpress SARS-CoV-2/FLU/RSV plus assay is intended as an aid in the diagnosis of influenza from Nasopharyngeal swab specimens and should not be used as a sole basis for treatment. Nasal washings and aspirates are unacceptable for Xpert Xpress SARS-CoV-2/FLU/RSV testing.  Fact Sheet for Patients: EntrepreneurPulse.com.au  Fact Sheet for Healthcare Providers: IncredibleEmployment.be  This test is not yet approved or cleared by the [place] FDA and has been authorized for detection and/or diagnosis of SARS-CoV-2 by FDA under an Emergency Use Authorization (EUA). This EUA will remain in effect (meaning this test can be used) for the duration of the COVID-19 declaration under Section 564(b)(1) of the Act, 21 U.S.C. section 360bbb-3(b)(1), unless the authorization is terminated or revoked.  Performed at [facility], [place] [address], [place], [place] [address]       Radiology Studies: CT Angio Chest PE W and/or Wo Contrast  Result Date: 03/24/2020 CLINICAL DATA:  Tachycardia for 3 weeks, shortness of breath, intermittent chest pain, question pulmonary embolism; history of lung cancer post chemotherapy and radiation therapy, history hypertension, type II diabetes mellitus, stage III chronic kidney disease, former smoker EXAM: CT ANGIOGRAPHY CHEST WITH CONTRAST TECHNIQUE: Multidetector CT imaging of the chest was performed using the standard protocol during bolus administration of intravenous contrast. Multiplanar CT image reconstructions and MIPs were obtained to evaluate the vascular anatomy. CONTRAST:  60mL OMNIPAQUE IOHEXOL 350 MG/ML SOLN IV COMPARISON:  CT chest 03/21/2020 FINDINGS: Cardiovascular: Atherosclerotic calcifications of aorta, proximal great vessels and coronary arteries. Aorta normal caliber without aneurysm or  dissection. RIGHT jugular Port-A-Cath with tip in RIGHT atrium. Cardiac chambers unremarkable. No pericardial effusion. Pulmonary arteries adequately opacified. Few scattered respiratory motion artifacts are present, notably LEFT lower lobe. No definite pulmonary emboli identified. Mediastinum/Nodes: 12 mm nodule RIGHT thyroid lobe; not clinically significant; no follow-up imaging recommended (ref: J Am Coll Radiol. 2015 Feb;12(2): 143-50).2.2 cm RIGHT paratracheal node image 51 unchanged. Additional scattered normal size mediastinal nodes. Previously measured subcarinal node less well delineated due to minimal infiltration. Esophagus unremarkable. Lungs/Pleura: BILATERAL pleural effusions increased from previous exam, partially loculated on RIGHT. Cavitary lesion and posterior RIGHT upper lobe again seen. Perihilar consolidation again seen. Narrowing of bronchus intermedius and proximal RIGHT middle lobe/RIGHT lower lobe bronchi. Narrowing of RIGHT upper lobe bronchus. Extensive infiltrate RIGHT upper lobe and superior segment RIGHT lower lobe unchanged likely due to radiation. Scattered areas of  atelectasis little changed. Remaining LEFT lung clear. No pneumothorax. Upper Abdomen: Prior cholecystectomy. No acute upper abdominal abnormalities. Musculoskeletal: No definite osseous lesions. Scattered degenerative disc disease changes thoracic spine. Review of the MIP images confirms the above findings. IMPRESSION: No definite evidence of pulmonary embolism. Stable RIGHT paratracheal adenopathy. Increased BILATERAL pleural effusions with compressive atelectasis of the adjacent lower lobes. Persistent perihilar consolidation RIGHT lung with narrowing of the origins of the lobar bronchi. Cavitary focus RIGHT upper lobe unchanged. Extensive infiltrates throughout RIGHT upper lobe and at superior segment of RIGHT lower lobe question related to radiation therapy. Aortic Atherosclerosis (ICD10-I70.0). Electronically Signed    By: Lavonia Dana M.D.   On: 03/24/2020 16:27   DG Chest Portable 1 View  Result Date: 03/24/2020 CLINICAL DATA:  Shortness of breath.  History of lung cancer. EXAM: PORTABLE CHEST 1 VIEW COMPARISON:  November 26, 2019. FINDINGS: The heart size and mediastinal contours are within normal limits. Left lung is clear. Increased right upper lobe and right perihilar opacity is noted concerning for pneumonia. Increased right basilar atelectasis or infiltrate is noted with associated pleural effusion. No pneumothorax is noted. Interval placement of right internal jugular Port-A-Cath with distal tip in expected position of the SVC. The visualized skeletal structures are unremarkable. IMPRESSION: Increased right upper lobe and right perihilar opacity is noted concerning for pneumonia. Increased right basilar atelectasis or infiltrate is noted with associated pleural effusion. Electronically Signed   By: Marijo Conception M.D.   On: 03/24/2020 14:52      Scheduled Meds: . Chlorhexidine Gluconate Cloth  6 each Topical Daily  . diltiazem  240 mg Oral Daily  . insulin aspart  0-15 Units Subcutaneous Q4H  . sodium chloride flush  10-40 mL Intracatheter Q12H   Continuous Infusions:   LOS: 2 days      Time spent: 35 minutes   Dessa Phi, DO [facility] 03/26/2020, 12:32 PM   Available via Epic secure chat 7am-7pm After these hours, please refer to coverage provider listed on [email or web address]

## 2020-03-26 NOTE — Consult Note (Addendum)
WOC Nurse Consult Note: Patient receiving care in [phone] Consult completed remotely after review of chart, [place] with bedside RN and review of pictures Reason for Consult: Foot wounds Wound type:  #1 Left 5th metatarsal base full thickness wound black with minimal bloody drainage on foam dressing.  #2 Right lateral ankle full thickness wound erythematous with minimal serosanguinous drainage on foam dressing.  #3  Right medial foot erythematous full thickness wound with serosanguinous drainage on foam dressing.  Pressure Injury POA: NA Measurement: see flow sheet  Dressing procedure/placement/frequency: Clean foot/ankle wounds with NS, pat dry. Apply Aquacel Advantage Kellie Simmering # [phone]), secure with foam dressing. Change daily  Monitor the wound area(s) for worsening of condition such as: Signs/symptoms of infection, increase in size, development of or worsening of odor, development of pain, or increased pain at the affected locations.   Notify the medical team if any of these develop.  Thank you for the consult. [place] nurse will not follow at this time.   Please re-consult the [place] team if needed.  Cathlean Marseilles Tamala Julian, MSN, RN, [place], Lysle Pearl, [facility] Wound Treatment Associate Pager [phone]

## 2020-03-26 NOTE — Telephone Encounter (Signed)
Pt gave permission (-heard also by [name] Heilingoetter)  , to talk to his daughter in law , Tammi about his condition. [phone].

## 2020-03-27 LAB — BASIC METABOLIC PANEL
Anion gap: 11 (ref 5–15)
BUN: 11 mg/dL (ref 8–23)
CO2: 30 mmol/L (ref 22–32)
Calcium: 8.1 mg/dL — ABNORMAL LOW (ref 8.9–10.3)
Chloride: 96 mmol/L — ABNORMAL LOW (ref 98–111)
Creatinine, Ser: 1.2 mg/dL (ref 0.61–1.24)
GFR, Estimated: >60 mL/min (ref >60)
Glucose, Bld: 332 mg/dL — ABNORMAL HIGH (ref 70–99)
Potassium: 4 mmol/L (ref 3.5–5.1)
Sodium: 137 mmol/L (ref 135–145)

## 2020-03-27 LAB — GLUCOSE, CAPILLARY
Glucose-Capillary: 314 mg/dL — ABNORMAL HIGH (ref 70–99)
Glucose-Capillary: 255 mg/dL — ABNORMAL HIGH (ref 70–99)

## 2020-03-27 LAB — MAGNESIUM: Magnesium: 1.5 mg/dL — ABNORMAL LOW (ref 1.7–2.4)

## 2020-03-27 MED ORDER — DILTIAZEM HCL ER COATED BEADS 240 MG PO CP24: 240.0000 mg | ORAL_CAPSULE | Freq: Every day | ORAL | 2 refills | Status: DC

## 2020-03-27 MED ORDER — GUAIFENESIN 100 MG/5ML PO SOLN
5.0000 mL | ORAL | Status: DC | PRN
  Administered 2020-03-27: 07:00:00 100 mg via ORAL
  Filled 2020-03-27: qty 20

## 2020-03-27 MED ORDER — MAGNESIUM SULFATE 2 GM/50ML IV SOLN
2.0000 g | Freq: Once | INTRAVENOUS | Status: AC
  Administered 2020-03-27: 08:00:00 2 g via INTRAVENOUS
  Filled 2020-03-27: qty 50

## 2020-03-27 MED ORDER — HEPARIN SOD (PORK) LOCK FLUSH 100 UNIT/ML IV SOLN
500.0000 [IU] | INTRAVENOUS | Status: AC | PRN
  Administered 2020-03-27: 13:00:00 500 [IU]

## 2020-03-27 NOTE — Consult Note (Signed)
   [facility] CM Inpatient Consult   03/27/2020  [name] [birth year] [id number]   [name] [ACO] Patient: [name]  Patient is currently active with [place] Management for chronic disease management services.  Patient has been engaged by a [place] Management Coordinator.  Our community based plan of care has focused on disease management and community resource support.    Patient is also active with Chronic Care Management with [place] with the pharmacy for medication management needs.  Plan: Patient will receive a post hospital call and will be evaluated for assessments and disease process education.   Will alert [place] Management Coordinator of transition back home.  Of note, [facility] Care Management services does not replace or interfere with any services that are needed or arranged by inpatient [facility] care management team.  For additional questions or referrals please contact:  Natividad Brood, RN BSN [facility] Liaison  [phone] business mobile phone Toll free office [phone]  Fax number: [phone] [email or web address]

## 2020-03-27 NOTE — Discharge Summary (Signed)
Physician Discharge Summary  DAXTIN LEIKER HYI:[id number] DOB: Dec 25, 1945 DOA: 03/24/2020  PCP: [name], MD  Admit date: 03/24/2020 Discharge date: 03/27/2020  Admitted From: Home Disposition:  Home  Recommendations for Outpatient Follow-up:  1. Follow up with PCP in 1 week 2. Follow up with cardiology Dr. Radford Pax in 2 to 3 weeks 3. Follow-up with Dr. [name] oncology in 1 week. Will need recommendations regarding initiating anticoagulation in setting of A Fib with history of anemia/thrombocytopenia   Discharge Condition: Stable CODE STATUS: Full code Diet recommendation:  Diet Orders (From admission, onward)    Start     Ordered   03/27/20 0000  Diet - low sodium heart healthy        03/27/20 1016   03/24/20 1821  Diet Heart Room service appropriate? Yes; Fluid consistency: Thin  Diet effective now       Question Answer Comment  Room service appropriate? 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  He was admitted with A. fib RVR/a flutter.  He was started on Cardizem drip and cardiology consulted.  Patient continued to improve and was started on oral Cardizem.  Oxygen was weaned down to room air prior to discharge home.  Discharge Diagnoses:  Active Problems:   Major depressive disorder, recurrent episode, in partial remission (HCC)   Essential hypertension   Diabetic neuropathy (HCC)   OSA (obstructive sleep apnea)   Chronic kidney disease, stage III (moderate) (HCC)   Former very heavy cigarette smoker (more than 40 per day)   Erectile dysfunction associated with type 2 diabetes mellitus (HCC)    Non-small cell carcinoma of right lung, stage 3 (HCC)   Typical atrial flutter (HCC)   Antineoplastic chemotherapy induced pancytopenia (HCC)   Paroxysmal atrial fibrillation with RVR (HCC)   Atrial fibrillation with RVR (HCC)   Prolonged QT interval   A. fib RVR/a flutter -Not a candidate for cardioversion at this time; patient would need to be anticoagulated for at least 3 weeks without any evidence of anemia/thrombocytopenia before consideration  -Appreciate cardiology; signed off 12/15 -Follow up with Dr. Radford Pax or APP in 2-3 weeks  -Continue cardizem daily   QT interval prolongation -Repeat EKG shows QTc 436   Acute hypoxemic respiratory failure -Multifactorial with A. 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 Earlie Server  Diabetes mellitus type 2, uncontrolled with hyperglycemia, diabetic nephropathy, diabetic foot wounds -Hemoglobin A1c 8.7 -Sliding scale insulin  CKD stage IIIa -Baseline creatinine 1.21 -Stable   OSA -CPAP nightly  Diabetic foot wounds -WOC RN consulted  Hypomagnesemia -Replaced   Discharge Instructions  Discharge Instructions    Amb referral to [facility]   Complete by: As directed    Call MD for:  difficulty breathing, headache or visual disturbances   Complete by: As directed    Call MD for:  extreme fatigue   Complete by: As directed    Call MD for:  hives   Complete by: As directed    Call MD for:  persistant  dizziness or light-headedness   Complete by: As directed    Call MD for:  persistant nausea and vomiting   Complete by: As directed    Call MD for:  severe uncontrolled pain   Complete by: As directed    Call MD for:  temperature >100.4   Complete by: As directed    Diet - low sodium heart healthy   Complete by: As directed    Discharge instructions   Complete by: As directed    You were cared for  by a hospitalist during your hospital stay. If you have any questions about your discharge medications or the care you received while you were in the hospital after you are discharged, you can call the unit and ask to speak with the hospitalist on call if the hospitalist that took care of you is not available. Once you are discharged, your primary care physician will handle any further medical issues. Please note that NO REFILLS for any discharge medications will be authorized once you are discharged, as it is imperative that you return to your primary care physician (or establish a relationship with a primary care physician if you do not have one) for your aftercare needs so that they can reassess your need for medications and monitor your lab values.   Discharge wound care:   Complete by: As directed    Clean foot/ankle wounds with NS, pat dry. Apply Aquacel Advantage Kellie Simmering # [phone]), secure with foam dressing. Change daily   Increase activity slowly   Complete by: As directed      Allergies as of 03/27/2020      Reactions   Tetracycline Itching, Rash      Medication List    STOP taking these medications   metoprolol succinate 25 MG 24 hr tablet Commonly known as: TOPROL-XL     TAKE these medications   acetaminophen 650 MG CR tablet Commonly known as: TYLENOL Take 650 mg by mouth every 8 (eight) hours as needed for pain.   albuterol 108 (90 Base) MCG/ACT inhaler Commonly known as: VENTOLIN HFA Inhale 1-2 puffs into the lungs every 6 (six) hours as needed for wheezing or shortness of breath.   cholecalciferol 1000 units tablet Commonly known as: VITAMIN D Take 2,000 Units by mouth daily.   CLEAR EYES COMPLETE OP Place 1 drop into both eyes daily as needed (dry eyes).   diltiazem 240 MG 24 hr capsule Commonly known as: CARDIZEM CD Take 1 capsule (240 mg total) by mouth daily. Start taking on: March 28, 2020   donepezil 10 MG tablet Commonly known as: ARICEPT TAKE 1  TABLET BY MOUTH AT BEDTIME   fluticasone 50 MCG/ACT nasal spray Commonly known as: FLONASE PLACE 2 SPRAYS IN EACH NOSTRIL DAILY What changed:   how much to take  how to take this  when to take this  additional instructions   guaiFENesin 600 MG 12 hr tablet Commonly known as: MUCINEX Take 600 mg by mouth daily as needed for cough.   HumuLIN R U-500 KwikPen 500 UNIT/ML kwikpen Generic drug: insulin regular human CONCENTRATED Inject 40 Units into the skin with breakfast, with lunch, and with evening meal.   memantine 10 MG tablet Commonly known as: NAMENDA TAKE 1 TABLET BY MOUTH TWICE A DAY   montelukast 10 MG tablet Commonly known as: SINGULAIR Take 1 tablet (10 mg total) by mouth at bedtime.   OZEMPIC (0.25 OR 0.5 MG/DOSE) Macy Inject 0.25 mg into the skin once a week.  Sundays   pantoprazole 20 MG tablet Commonly known as: Protonix Take 1 tablet (20 mg total) by mouth daily.   Pen Needles 31G X 5 MM Misc 1 each by Does not apply route 3 (three) times daily. To inject insulin E11.42   prochlorperazine 10 MG tablet Commonly known as: COMPAZINE Take 1 tablet (10 mg total) by mouth every 6 (six) hours as needed. What changed: reasons to take this   simvastatin 40 MG tablet Commonly known as: ZOCOR TAKE 1 TABLET BY MOUTH AT BEDTIME What changed: when to take this   venlafaxine XR 75 MG 24 hr capsule Commonly known as: EFFEXOR-XR TAKE 1 CAPSULE BY MOUTH EVERY MORNING AND 2 CAPSULES AT BEDTIME What changed: See the new instructions.   vitamin C 1000 MG tablet Take 1,000 mg by mouth daily.            Discharge Care Instructions  (From admission, onward)         Start     Ordered   03/27/20 0000  Discharge wound care:       Comments: Clean foot/ankle wounds with NS, pat dry. Apply Aquacel Advantage Kellie Simmering # [phone]), secure with foam dressing. Change daily   03/27/20 1016          Follow-up Information    Copland, Spencer, MD. Schedule an appointment  as soon as possible for a visit in 1 week(s).   Specialties: Family Medicine, Sports Medicine Contact information: [place] [address] [phone]        Sueanne Margarita, MD. Schedule an appointment as soon as possible for a visit in 2 week(s).   Specialty: Cardiology Contact information: [address] [place] [address] [phone]        Curt Bears, MD. Schedule an appointment as soon as possible for a visit in 1 week(s).   Specialty: Oncology Contact information: [address] [place] [address] [phone]              Allergies  Allergen Reactions  . Tetracycline Itching and Rash    Consultations:  Cardiology   Procedures/Studies: CT Chest W Contrast  Result Date: 03/21/2020 CLINICAL DATA:  Primary Cancer Type: Lung Imaging Indication: Assess response to therapy Interval therapy since last imaging? Yes Initial Cancer Diagnosis Date: 10/17/2019; Established by: Biopsy-proven Detailed Pathology: Limited stage small cell lung cancer. Primary Tumor location:  Right lower lobe/right hilum. Surgeries: Cardiac catheterization. Cholecystectomy. Chemotherapy: Yes; Ongoing? Yes; Most recent administration: 03/03/2020 Immunotherapy? No Radiation therapy? Yes; Date Range: 10/18/2019 - 11/28/2019; Target: Right lung EXAM: CT CHEST WITH CONTRAST TECHNIQUE: Multidetector CT imaging of the chest was performed during intravenous contrast administration. CONTRAST:  70mL OMNIPAQUE IOHEXOL 300 MG/ML  SOLN COMPARISON:  Most recent CT chest 02/07/2020.  11/01/2019 PET-CT. FINDINGS: Cardiovascular: Normal heart size. No significant pericardial effusion/thickening. Three-vessel coronary atherosclerosis. Atherosclerotic nonaneurysmal thoracic aorta. Normal caliber pulmonary arteries. No central pulmonary emboli. Right internal jugular Port-A-Cath terminates at the cavoatrial junction. Mediastinum/Nodes: Hypodense 1.2 cm right  thyroid nodule, stable. Not clinically significant; no follow-up imaging recommended (ref: J Am Coll Radiol. 2015 Feb;12(2): 143-50). Unremarkable esophagus. No axillary adenopathy. Enlarged 2.1 cm short axis diameter right paratracheal node (series 2/image 51), previously 2.1 cm, stable. Enlarged 1.1 cm right subcarinal node (series 2/image 71), previously 1.4 cm, slightly decreased. No new pathologically enlarged mediastinal nodes. No left hilar adenopathy. Lungs/Pleura: No pneumothorax. Small to moderate right pleural effusion, new. Small dependent left pleural effusion, new.  Poorly marginated masslike posterior right perihilar consolidation measuring 6.1 x 5.7 cm (series 2/image 61), increased from 4.7 x 3.4 cm. Extensive surrounding patchy consolidation, ground-glass opacity, septal thickening throughout the right lung, with a sharp inferior margin, substantially increased, compatible with evolving postradiation change. No new significant pulmonary nodules. Upper abdomen: Cholecystectomy. Musculoskeletal: No aggressive appearing focal osseous lesions. Moderate thoracic spondylosis. IMPRESSION: 1. Poorly marginated masslike posterior right perihilar lung consolidation has increased. Given the extensive increased patchy consolidation and ground-glass opacity in the surrounding right lung, these findings may all represent evolving post radiation change, although progression of the perihilar right lung malignancy is difficult to exclude. PET-CT suggested for further characterization at this time. 2. Right paratracheal and subcarinal lymphadenopathy is stable to decreased. 3. New small to moderate right and small dependent left pleural effusions. 4. Aortic Atherosclerosis (ICD10-I70.0). Electronically Signed   By: Ilona Sorrel M.D.   On: 03/21/2020 11:19   CT Angio Chest PE W and/or Wo Contrast  Result Date: 03/24/2020 CLINICAL DATA:  Tachycardia for 3 weeks, shortness of breath, intermittent chest pain,  question pulmonary embolism; history of lung cancer post chemotherapy and radiation therapy, history hypertension, type II diabetes mellitus, stage III chronic kidney disease, former smoker EXAM: CT ANGIOGRAPHY CHEST WITH CONTRAST TECHNIQUE: Multidetector CT imaging of the chest was performed using the standard protocol during bolus administration of intravenous contrast. Multiplanar CT image reconstructions and MIPs were obtained to evaluate the vascular anatomy. CONTRAST:  68mL OMNIPAQUE IOHEXOL 350 MG/ML SOLN IV COMPARISON:  CT chest 03/21/2020 FINDINGS: Cardiovascular: Atherosclerotic calcifications of aorta, proximal great vessels and coronary arteries. Aorta normal caliber without aneurysm or dissection. RIGHT jugular Port-A-Cath with tip in RIGHT atrium. Cardiac chambers unremarkable. No pericardial effusion. Pulmonary arteries adequately opacified. Few scattered respiratory motion artifacts are present, notably LEFT lower lobe. No definite pulmonary emboli identified. Mediastinum/Nodes: 12 mm nodule RIGHT thyroid lobe; not clinically significant; no follow-up imaging recommended (ref: J Am Coll Radiol. 2015 Feb;12(2): 143-50).2.2 cm RIGHT paratracheal node image 51 unchanged. Additional scattered normal size mediastinal nodes. Previously measured subcarinal node less well delineated due to minimal infiltration. Esophagus unremarkable. Lungs/Pleura: BILATERAL pleural effusions increased from previous exam, partially loculated on RIGHT. Cavitary lesion and posterior RIGHT upper lobe again seen. Perihilar consolidation again seen. Narrowing of bronchus intermedius and proximal RIGHT middle lobe/RIGHT lower lobe bronchi. Narrowing of RIGHT upper lobe bronchus. Extensive infiltrate RIGHT upper lobe and superior segment RIGHT lower lobe unchanged likely due to radiation. Scattered areas of atelectasis little changed. Remaining LEFT lung clear. No pneumothorax. Upper Abdomen: Prior cholecystectomy. No acute upper  abdominal abnormalities. Musculoskeletal: No definite osseous lesions. Scattered degenerative disc disease changes thoracic spine. Review of the MIP images confirms the above findings. IMPRESSION: No definite evidence of pulmonary embolism. Stable RIGHT paratracheal adenopathy. Increased BILATERAL pleural effusions with compressive atelectasis of the adjacent lower lobes. Persistent perihilar consolidation RIGHT lung with narrowing of the origins of the lobar bronchi. Cavitary focus RIGHT upper lobe unchanged. Extensive infiltrates throughout RIGHT upper lobe and at superior segment of RIGHT lower lobe question related to radiation therapy. Aortic Atherosclerosis (ICD10-I70.0). Electronically Signed   By: Lavonia Dana M.D.   On: 03/24/2020 16:27   DG CHEST PORT 1 VIEW  Result Date: 03/26/2020 CLINICAL DATA:  Cough.  Right-sided lung carcinoma EXAM: PORTABLE CHEST 1 VIEW COMPARISON:  March 24, 2020 chest radiograph and chest CT FINDINGS: Port-A-Cath tip is in the superior vena cava. No pneumothorax. Widespread airspace opacity throughout much of the right lung with  right pleural effusion remains. The area of cavitation seen previously in the right upper lobe by CT is less well seen by radiography. Left lung is now clear. Heart size is normal. Pulmonary vascularity on the left is normal. Pulmonary vascularity on the right is somewhat distorted due to consolidation. Adenopathy on the right seen on recent CT is less well seen by radiography. No bone lesions appreciable. IMPRESSION: Widespread opacification throughout much of the right lung with right pleural effusion again noted, similar to recent studies. Area of cavitation in the right upper lobe better seen by CT. Appearance by radiography is consistent with extensive pneumonitis, although underlying mass may well be present and obscured by pneumonitis. Left lung now clear. Stable cardiac silhouette. Adenopathy seen on CT not well seen by radiography.  Electronically Signed   By: Lowella Grip III M.D.   On: 03/26/2020 13:56   DG Chest Portable 1 View  Result Date: 03/24/2020 CLINICAL DATA:  Shortness of breath.  History of lung cancer. EXAM: PORTABLE CHEST 1 VIEW COMPARISON:  November 26, 2019. FINDINGS: The heart size and mediastinal contours are within normal limits. Left lung is clear. Increased right upper lobe and right perihilar opacity is noted concerning for pneumonia. Increased right basilar atelectasis or infiltrate is noted with associated pleural effusion. No pneumothorax is noted. Interval placement of right internal jugular Port-A-Cath with distal tip in expected position of the SVC. The visualized skeletal structures are unremarkable. IMPRESSION: Increased right upper lobe and right perihilar opacity is noted concerning for pneumonia. Increased right basilar atelectasis or infiltrate is noted with associated pleural effusion. Electronically Signed   By: Marijo Conception M.D.   On: 03/24/2020 14:52   ECHOCARDIOGRAM COMPLETE  Result Date: 03/12/2020    ECHOCARDIOGRAM REPORT   Patient Name:   LATHANIEL LEGATE Date of Exam: 03/12/2020 Medical Rec #:  [id number]       Height:       71.0 in Accession #:    [id number]      Weight:       182.0 lb Date of Birth:  [birth year]       BSA:          2.026 m Patient Age:    12 years        BP:           127/71 mmHg Patient Gender: M               HR:           98 bpm. Exam Location:  Inpatient Procedure: 2D Echo, 3D Echo, Color Doppler, Cardiac Doppler and Intracardiac            Opacification Agent Indications:    Atrial Flutter 427.32 / I48.92  History:        Patient has prior history of Echocardiogram examinations, most                 recent 12/24/2014. Risk Factors:Hypertension, Diabetes and                 Dyslipidemia. Small cell carcinoma of right lung. Chronic kidney                 disease, stage III.  Sonographer:    Darlina Sicilian RDCS Referring Phys: [phone] JARED M GARDNER  Sonographer Comments:  No parasternal window and suboptimal apical window. IMPRESSIONS  1. Left ventricular ejection fraction, by estimation, is 65 to 70%. The left ventricle has hyperdynamic function. The  left ventricle has no regional wall motion abnormalities. There is mild concentric left ventricular hypertrophy. Left ventricular diastolic parameters are indeterminate.  2. Right ventricular systolic function is normal. The right ventricular size is normal. Mildly increased right ventricular wall thickness.  3. The mitral valve is grossly normal. No evidence of mitral valve regurgitation.  4. The aortic valve is tricuspid. There is mild calcification of the aortic valve. Aortic valve regurgitation is not visualized.  5. The inferior vena cava is normal in size with greater than 50% respiratory variability, suggesting right atrial pressure of 3 mmHg. Comparison(s): A prior study was performed on 12/24/2014. No significant change from prior study. FINDINGS  Left Ventricle: Left ventricular ejection fraction, by estimation, is 65 to 70%. The left ventricle has hyperdynamic function. The left ventricle has no regional wall motion abnormalities. Definity contrast agent was given IV to delineate the left ventricular endocardial borders. The left ventricular internal cavity size was small. There is mild concentric left ventricular hypertrophy. Left ventricular diastolic parameters are indeterminate. Right Ventricle: The right ventricular size is normal. Mildly increased right ventricular wall thickness. Right ventricular systolic function is normal. Left Atrium: Left atrial size was normal in size. Right Atrium: Right atrial size was normal in size. Pericardium: Trivial pericardial effusion is present. Mitral Valve: The mitral valve is grossly normal. No evidence of mitral valve regurgitation. Tricuspid Valve: The tricuspid valve is normal in structure. Tricuspid valve regurgitation is not demonstrated. Aortic Valve: The aortic valve is  tricuspid. There is mild calcification of the aortic valve. Aortic valve regurgitation is not visualized. Pulmonic Valve: The pulmonic valve was not well visualized. Pulmonic valve regurgitation is not visualized. Aorta: The aortic root is normal in size and structure. Venous: The pulmonary veins were not well visualized. The inferior vena cava is normal in size with greater than 50% respiratory variability, suggesting right atrial pressure of 3 mmHg. IAS/Shunts: The atrial septum is grossly normal.  LEFT VENTRICLE PLAX 2D LVIDd:         3.60 cm  Diastology LVIDs:         2.60 cm  LV e' medial:    10.80 cm/s LV PW:         1.10 cm  LV E/e' medial:  10.9 LV IVS:        1.10 cm  LV e' lateral:   18.30 cm/s LVOT diam:     2.10 cm  LV E/e' lateral: 6.4 LV SV:         47 LV SV Index:   23 LVOT Area:     3.46 cm                         3D Volume EF                         LV 3D EF:    71.20 %                         LV 3D EDV:   85200.00 mm                         LV 3D ESV:   24600.00 mm                         LV 3D SV:    60700.00 mm RIGHT VENTRICLE  RV S prime:     16.80 cm/s TAPSE (M-mode): 1.4 cm LEFT ATRIUM             Index       RIGHT ATRIUM           Index LA diam:        3.30 cm 1.63 cm/m  RA Area:     11.80 cm LA Vol (A2C):   32.2 ml 15.90 ml/m RA Volume:   21.90 ml  10.81 ml/m LA Vol (A4C):   32.8 ml 16.19 ml/m LA Biplane Vol: 34.0 ml 16.78 ml/m  AORTIC VALVE LVOT Vmax:   93.50 cm/s LVOT Vmean:  66.500 cm/s LVOT VTI:    0.136 m  AORTA Ao Root diam: 2.80 cm MITRAL VALVE MV Area (PHT): 4.80 cm     SHUNTS MV Decel Time: 158 msec     Systemic VTI:  0.14 m MV E velocity: 118.00 cm/s  Systemic Diam: 2.10 cm MV A velocity: 46.30 cm/s MV E/A ratio:  2.55 Rudean Haskell MD Electronically signed by Rudean Haskell MD Signature Date/Time: 03/12/2020/3:52:17 PM    Final        Discharge Exam: Vitals:   03/27/20 0300 03/27/20 0540  BP: (!) 168/80 (!) 171/84  Pulse: (!) 104 (!) 106  Resp:  20   Temp:  98.5 F (36.9 C)  SpO2: 97% 96%    General: Pt is alert, awake, not in acute distress Cardiovascular: RRR (normal sinus rhythm rate 90s on telemetry), S1/S2 +, no edema Respiratory: CTA bilaterally anteriorly without respiratory distress or conversational dyspnea Extremities: no edema, no cyanosis Psych: Normal mood and affect, stable judgement and insight     The results of significant diagnostics from this hospitalization (including imaging, microbiology, ancillary and laboratory) are listed below for reference.     Microbiology: Recent Results (from the past 240 hour(s))  Resp Panel by RT-PCR (Flu A&B, Covid) Nasopharyngeal Swab     Status: None   Collection Time: 03/24/20  2:20 PM   Specimen: Nasopharyngeal Swab; Nasopharyngeal(NP) swabs in vial transport medium  Result Value Ref Range Status   SARS Coronavirus 2 by RT PCR NEGATIVE NEGATIVE Final    Comment: (NOTE) SARS-CoV-2 target nucleic acids are NOT DETECTED.  The SARS-CoV-2 RNA is generally detectable in upper respiratory specimens during the acute phase of infection. The lowest concentration of SARS-CoV-2 viral copies this assay can detect is 138 copies/mL. A negative result does not preclude SARS-Cov-2 infection and should not be used as the sole basis for treatment or other patient management decisions. A negative result may occur with  improper specimen collection/handling, submission of specimen other than nasopharyngeal swab, presence of viral mutation(s) within the areas targeted by this assay, and inadequate number of viral copies(<138 copies/mL). A negative result must be combined with clinical observations, patient history, and epidemiological information. The expected result is Negative.  Fact Sheet for Patients:  EntrepreneurPulse.com.au  Fact Sheet for Healthcare Providers:  IncredibleEmployment.be  This test is no t yet approved or cleared by the [place] FDA and  has been authorized for detection and/or diagnosis of SARS-CoV-2 by FDA under an Emergency Use Authorization (EUA). This EUA will remain  in effect (meaning this test can be used) for the duration of the COVID-19 declaration under Section 564(b)(1) of the Act, 21 U.S.C.section 360bbb-3(b)(1), unless the authorization is terminated  or revoked sooner.       Influenza A by PCR NEGATIVE NEGATIVE Final   Influenza B  by PCR NEGATIVE NEGATIVE Final    Comment: (NOTE) The Xpert Xpress SARS-CoV-2/FLU/RSV plus assay is intended as an aid in the diagnosis of influenza from Nasopharyngeal swab specimens and should not be used as a sole basis for treatment. Nasal washings and aspirates are unacceptable for Xpert Xpress SARS-CoV-2/FLU/RSV testing.  Fact Sheet for Patients: EntrepreneurPulse.com.au  Fact Sheet for Healthcare Providers: IncredibleEmployment.be  This test is not yet approved or cleared by the [place] FDA and has been authorized for detection and/or diagnosis of SARS-CoV-2 by FDA under an Emergency Use Authorization (EUA). This EUA will remain in effect (meaning this test can be used) for the duration of the COVID-19 declaration under Section 564(b)(1) of the Act, 21 U.S.C. section 360bbb-3(b)(1), unless the authorization is terminated or revoked.  Performed at [facility], [place] [address], [place], [place] [address]      Labs: BNP (last 3 results) Recent Labs    03/24/20 1426  BNP 967.8*   Basic Metabolic Panel: Recent Labs  Lab 03/24/20 0943 03/25/20 0438 03/26/20 0640 03/27/20 0329  NA 134* 137 136 137  K 3.7 3.6 3.2* 4.0  CL 97* 97* 97* 96*  CO2 29 30 30 30   GLUCOSE 114* 159* 167* 332*  BUN 15 12 13 11   CREATININE 1.27* 1.07 1.22 1.20  CALCIUM 8.6* 8.2* 7.9* 8.1*  MG  --  1.6* 1.7 1.5*  PHOS  --  3.4 3.4  --    Liver Function Tests: Recent Labs  Lab 03/24/20 0943  03/25/20 0438 03/26/20 0640  AST 20 15 10*  ALT 28 24 18   ALKPHOS 157* 129* 112  BILITOT 0.4 0.6 0.4  PROT 6.4* 6.5 5.9*  ALBUMIN 1.8* 2.2* 2.0*   No results for input(s): LIPASE, AMYLASE in the last 168 hours. No results for input(s): AMMONIA in the last 168 hours. CBC: Recent Labs  Lab 03/24/20 0943 03/26/20 0640  WBC 7.5 5.4  NEUTROABS 6.5 4.3  HGB 7.2* 8.1*  HCT 21.9* 24.6*  MCV 92.4 93.2  PLT 127* 153   Cardiac Enzymes: No results for input(s): CKTOTAL, CKMB, CKMBINDEX, TROPONINI in the last 168 hours. BNP: Invalid input(s): POCBNP CBG: Recent Labs  Lab 03/26/20 0734 03/26/20 1136 03/26/20 1549 03/26/20 2219 03/27/20 0737  GLUCAP 152* 237* 260* 252* 314*   D-Dimer Recent Labs    03/24/20 1426  DDIMER 3.52*   Hgb A1c No results for input(s): HGBA1C in the last 72 hours. Lipid Profile Recent Labs    03/25/20 0438  CHOL 107  HDL 31*  LDLCALC 61  TRIG 73  CHOLHDL 3.5   Thyroid function studies Recent Labs    03/24/20 2000  TSH 1.183   Anemia work up No results for input(s): VITAMINB12, FOLATE, FERRITIN, TIBC, IRON, RETICCTPCT in the last 72 hours. Urinalysis    Component Value Date/Time   COLORURINE YELLOW 03/11/2020 1556   APPEARANCEUR HAZY (A) 03/11/2020 1556   LABSPEC 1.012 03/11/2020 1556   PHURINE 5.0 03/11/2020 1556   GLUCOSEU >=500 (A) 03/11/2020 1556   HGBUR MODERATE (A) 03/11/2020 1556   BILIRUBINUR NEGATIVE 03/11/2020 1556   BILIRUBINUR 1+ 09/10/2014 1615   KETONESUR NEGATIVE 03/11/2020 1556   PROTEINUR 100 (A) 03/11/2020 1556   UROBILINOGEN 0.2 12/05/2014 2146   NITRITE NEGATIVE 03/11/2020 1556   LEUKOCYTESUR NEGATIVE 03/11/2020 1556   Sepsis Labs Invalid input(s): PROCALCITONIN,  WBC,  LACTICIDVEN Microbiology Recent Results (from the past 240 hour(s))  Resp Panel by RT-PCR (Flu A&B, Covid) Nasopharyngeal Swab  Status: None   Collection Time: 03/24/20  2:20 PM   Specimen: Nasopharyngeal Swab; Nasopharyngeal(NP)  swabs in vial transport medium  Result Value Ref Range Status   SARS Coronavirus 2 by RT PCR NEGATIVE NEGATIVE Final    Comment: (NOTE) SARS-CoV-2 target nucleic acids are NOT DETECTED.  The SARS-CoV-2 RNA is generally detectable in upper respiratory specimens during the acute phase of infection. The lowest concentration of SARS-CoV-2 viral copies this assay can detect is 138 copies/mL. A negative result does not preclude SARS-Cov-2 infection and should not be used as the sole basis for treatment or other patient management decisions. A negative result may occur with  improper specimen collection/handling, submission of specimen other than nasopharyngeal swab, presence of viral mutation(s) within the areas targeted by this assay, and inadequate number of viral copies(<138 copies/mL). A negative result must be combined with clinical observations, patient history, and epidemiological information. The expected result is Negative.  Fact Sheet for Patients:  EntrepreneurPulse.com.au  Fact Sheet for Healthcare Providers:  IncredibleEmployment.be  This test is no t yet approved or cleared by the [place] FDA and  has been authorized for detection and/or diagnosis of SARS-CoV-2 by FDA under an Emergency Use Authorization (EUA). This EUA will remain  in effect (meaning this test can be used) for the duration of the COVID-19 declaration under Section 564(b)(1) of the Act, 21 U.S.C.section 360bbb-3(b)(1), unless the authorization is terminated  or revoked sooner.       Influenza A by PCR NEGATIVE NEGATIVE Final   Influenza B by PCR NEGATIVE NEGATIVE Final    Comment: (NOTE) The Xpert Xpress SARS-CoV-2/FLU/RSV plus assay is intended as an aid in the diagnosis of influenza from Nasopharyngeal swab specimens and should not be used as a sole basis for treatment. Nasal washings and aspirates are unacceptable for Xpert Xpress  SARS-CoV-2/FLU/RSV testing.  Fact Sheet for Patients: EntrepreneurPulse.com.au  Fact Sheet for Healthcare Providers: IncredibleEmployment.be  This test is not yet approved or cleared by the [place] FDA and has been authorized for detection and/or diagnosis of SARS-CoV-2 by FDA under an Emergency Use Authorization (EUA). This EUA will remain in effect (meaning this test can be used) for the duration of the COVID-19 declaration under Section 564(b)(1) of the Act, 21 U.S.C. section 360bbb-3(b)(1), unless the authorization is terminated or revoked.  Performed at [facility], [place] [address], [place], [place] [address]      Patient was seen and examined on the day of discharge and was found to be in stable condition. Time coordinating discharge: 20 minutes including assessment and coordination of care, as well as examination of the patient.   SIGNED:  Dessa Phi, DO [facility] 03/27/2020, 10:16 AM

## 2020-03-27 NOTE — Evaluation (Signed)
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  He was admitted with A. fib RVR/a flutter.  Clinical Impression  Patient evaluated by Physical Therapy with no further acute PT needs identified. All education has been completed and the patient has no further questions.  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  Pt able to transfer and propel w/c in hallway with HR 104-108 bpm.  Pt reports being at baseline except for mild dyspnea. See below for any follow-up Physical Therapy or equipment needs. PT is signing off. Thank you for this referral.     Follow Up Recommendations No PT follow up    Equipment Recommendations  None recommended by PT    Recommendations for Other Services       Precautions / Restrictions Precautions Precautions: None Precaution Comments: Wounds on bilateral feet (reports predominantly non weight bearing for healing)      Mobility  Bed Mobility               General bed mobility comments: Sitting in recliner.    Transfers Overall transfer level: Needs assistance Equipment used: None Transfers: Sit to/from Stand Sit to Stand: Supervision         General transfer comment: supervision for safety with IV pole and legrests of w/c  Ambulation/Gait                Hotel manager mobility: Yes Wheelchair propulsion: Both upper extremities Wheelchair parts: Independent Distance: 200 ft Wheelchair Assistance Details (indicate cue type and  reason): pt utilizes brakes and turns well, no assist required; HR 104-108 with propelling w/c; pt reports mild dyspnea with activity however has not been using w/c for a few days  Modified Rankin (Stroke Patients Only)       Balance                                             Pertinent Vitals/Pain Pain Assessment: No/denies pain    Home Living Family/patient expects to be discharged to:: Private residence Living Arrangements: Other relatives (Mother and grandson) Available Help at Discharge: Available 24 hours/day;Family Type of Home: House Home Access: Ramped entrance     Home Layout: One level Home Equipment: Shower seat - built in;Walker - 4 wheels;Wheelchair - manual      Prior Function Level of Independence: Independent         Comments: Driving himself to radiation; modified independent from wc level.     Hand Dominance   Dominant Hand: Right    Extremity/Trunk Assessment   Upper Extremity Assessment Upper Extremity Assessment: Overall WFL for tasks assessed    Lower Extremity Assessment Lower Extremity Assessment: Overall WFL for tasks assessed    Cervical / Trunk Assessment Cervical / Trunk Assessment: Normal  Communication   Communication: No difficulties  Cognition Arousal/Alertness: Awake/alert Behavior During Therapy: WFL for tasks assessed/performed Overall Cognitive Status: Within Functional Limits for tasks  assessed                                        General Comments      Exercises     Assessment/Plan    PT Assessment Patent does not need any further PT services  PT Problem List         PT Treatment Interventions      PT Goals (Current goals can be found in the Care Plan section)  Acute Rehab PT Goals PT Goal Formulation: All assessment and education complete, DC therapy    Frequency     Barriers to discharge        Co-evaluation               AM-PAC PT "6 Clicks"  Mobility  Outcome Measure Help needed turning from your back to your side while in a flat bed without using bedrails?: None Help needed moving from lying on your back to sitting on the side of a flat bed without using bedrails?: None Help needed moving to and from a bed to a chair (including a wheelchair)?: None Help needed standing up from a chair using your arms ([name].g., wheelchair or bedside chair)?: None Help needed to walk in hospital room?: None Help needed climbing 3-5 steps with a railing? : A Little 6 Click Score: 23    End of Session Equipment Utilized During Treatment: Gait belt Activity Tolerance: Patient tolerated treatment well Patient left: in chair;with call bell/phone within reach   PT Visit Diagnosis: Other abnormalities of gait and mobility (R26.89)    Time: 4967-5916 PT Time Calculation (min) (ACUTE ONLY): 16 min   Charges:   PT Evaluation $PT Eval Low Complexity: 1 Low         Kati PT, DPT Acute Rehabilitation Services Pager: [phone] Office: [phone]  [name],[name] 03/27/2020, 10:00 AM

## 2020-03-27 NOTE — Care Management Important Message (Signed)
Important Message  Patient Details IM Letter given to the Patient. Name: [name] [name]: [id number] Date of Birth: [birth year]   Medicare Important Message Given:  Yes     Kerin Salen 03/27/2020, 11:56 AM

## 2020-03-29 DIAGNOSIS — C3491 Malignant neoplasm of unspecified part of right bronchus or lung: Secondary | ICD-10-CM | POA: Diagnosis not present

## 2020-03-29 DIAGNOSIS — Z4800 Encounter for change or removal of nonsurgical wound dressing: Secondary | ICD-10-CM | POA: Diagnosis not present

## 2020-03-29 DIAGNOSIS — E1122 Type 2 diabetes mellitus with diabetic chronic kidney disease: Secondary | ICD-10-CM | POA: Diagnosis not present

## 2020-03-29 DIAGNOSIS — I129 Hypertensive chronic kidney disease with stage 1 through stage 4 chronic kidney disease, or unspecified chronic kidney disease: Secondary | ICD-10-CM | POA: Diagnosis not present

## 2020-03-29 DIAGNOSIS — G4733 Obstructive sleep apnea (adult) (pediatric): Secondary | ICD-10-CM | POA: Diagnosis not present

## 2020-03-29 DIAGNOSIS — L97429 Non-pressure chronic ulcer of left heel and midfoot with unspecified severity: Secondary | ICD-10-CM | POA: Diagnosis not present

## 2020-03-29 DIAGNOSIS — R000 Tachycardia, unspecified: Secondary | ICD-10-CM | POA: Diagnosis not present

## 2020-03-29 DIAGNOSIS — Z794 Long term (current) use of insulin: Secondary | ICD-10-CM | POA: Diagnosis not present

## 2020-03-29 DIAGNOSIS — D61810 Antineoplastic chemotherapy induced pancytopenia: Secondary | ICD-10-CM | POA: Diagnosis not present

## 2020-03-29 DIAGNOSIS — E11621 Type 2 diabetes mellitus with foot ulcer: Secondary | ICD-10-CM | POA: Diagnosis not present

## 2020-03-29 DIAGNOSIS — J45909 Unspecified asthma, uncomplicated: Secondary | ICD-10-CM | POA: Diagnosis not present

## 2020-03-29 DIAGNOSIS — I4892 Unspecified atrial flutter: Secondary | ICD-10-CM | POA: Diagnosis not present

## 2020-03-29 DIAGNOSIS — Z87891 Personal history of nicotine dependence: Secondary | ICD-10-CM | POA: Diagnosis not present

## 2020-03-29 DIAGNOSIS — N1831 Chronic kidney disease, stage 3a: Secondary | ICD-10-CM | POA: Diagnosis not present

## 2020-03-29 DIAGNOSIS — Z9181 History of falling: Secondary | ICD-10-CM | POA: Diagnosis not present

## 2020-03-29 DIAGNOSIS — E1142 Type 2 diabetes mellitus with diabetic polyneuropathy: Secondary | ICD-10-CM | POA: Diagnosis not present

## 2020-03-29 DIAGNOSIS — I480 Paroxysmal atrial fibrillation: Secondary | ICD-10-CM | POA: Diagnosis not present

## 2020-03-29 DIAGNOSIS — E041 Nontoxic single thyroid nodule: Secondary | ICD-10-CM | POA: Diagnosis not present

## 2020-04-01 ENCOUNTER — Other Ambulatory Visit: Payer: Self-pay | Admitting: Medical Oncology

## 2020-04-01 ENCOUNTER — Other Ambulatory Visit: Payer: Self-pay | Admitting: *Deleted

## 2020-04-01 ENCOUNTER — Telehealth: Payer: Self-pay | Admitting: Medical Oncology

## 2020-04-01 DIAGNOSIS — D649 Anemia, unspecified: Secondary | ICD-10-CM

## 2020-04-01 NOTE — Patient Instructions (Addendum)
Goals Addressed            This Visit's Progress   . [facility] - Careful Skin Care   On track    Follow Up Date - 04/15/2020  Timeframe:  Short-Term Goal Priority:  Medium Start Date:       04/01/2020                      Expected End Date:   05/02/2020                      - clean and dry skin well - use lotion that is suggested by the doctor or nurse    Why is this important?   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   Taking really good care of your skin will help to keep your skin unbroken.    Notes:   11/3 - Wound still in the process of healing  12/7 - Reviewed ongoing wound healing and dressing changes, remain with home health involved  12/21 - Confirmed still active with wound care center and home health for wound care    . [facility] - Make and Keep All Appointments   On track    Follow Up Date 04/15/2020  Timeframe:  Short-Term Goal Priority:  Medium Start Date:         04/01/2020                    Expected End Date:      05/02/2020                   - call to cancel if needed - keep a calendar with appointment dates    Why is this important?   Part of staying healthy is seeing the doctor for follow-up care.  If you forget your appointments, there are some things you can do to stay on track.    Notes:   Continues with cancer treatment appointments  12/7 - Upcoming appointments reviewed, assessed need for transportation assistance, none needed  12/21 - Reviewed appointment calender, encouraged to call oncology to schedule office visit    . [facility] - Manage Fatigue (Tiredness)   On track    Follow Up Date 1/30   - develop a new routine to improve sleep - eat healthy - maintain healthy weight    Why is this important?   Cancer treatment and its side effects can drain your energy. It can keep you from doing things you would like to do.  There are many things that you can do to manage fatigue.    Notes:   Re-educated on slowly increasing activity to  decrease fatigue  12/7 - Discussed overall increasing activity and developing exercise routine    . [facility] - Track and Manage Heart Rate and Rhythm-Atrial Fibrillation   On track    Timeframe:  Short-Term Goal Priority:  Medium Start Date:  03/18/2020                          Expected End Date:      04/18/2020                    - check pulse (heart) rate before taking medicine - check pulse (heart) rate once a day - make a plan to exercise regularly - keep all lab appointments - take medicine as prescribed  Why is this important?    Atrial fibrillation may have no symptoms. Sometimes the symptoms get worse or happen more often.   It is important to keep track of what your symptoms are and when they happen.   A change in symptoms is important to discuss with your doctor or nurse.   Being active and healthy eating will also help you manage your heart condition.     Notes:   12/21 - Reminded to keep log of BP and HR daily       Atrial Fibrillation  Atrial fibrillation is a type of heartbeat that is irregular or fast. If you have this condition, your heart beats without any order. This makes it hard for your heart to pump blood in a normal way. Atrial fibrillation may come and go, or it may become a long-lasting problem. If this condition is not treated, it can put you at higher risk for stroke, heart failure, and other heart problems. What are the causes? This condition may be caused by diseases that damage the heart. They include:  High blood pressure.  Heart failure.  Heart valve disease.  Heart surgery. Other causes include:  Diabetes.  Thyroid disease.  Being overweight.  Kidney disease. Sometimes the cause is not known. What increases the risk? You are more likely to develop this condition if:  You are older.  You smoke.  You exercise often and very hard.  You have a family history of this condition.  You are a man.  You use drugs.  You drink a  lot of alcohol.  You have lung conditions, such as emphysema, pneumonia, or COPD.  You have sleep apnea. What are the signs or symptoms? Common symptoms of this condition include:  A feeling that your heart is beating very fast.  Chest pain or discomfort.  Feeling short of breath.  Suddenly feeling light-headed or weak.  Getting tired easily during activity.  Fainting.  Sweating. In some cases, there are no symptoms. How is this treated? Treatment for this condition depends on underlying conditions and how you feel when you have atrial fibrillation. They include:  Medicines to: ? Prevent blood clots. ? Treat heart rate or heart rhythm problems.  Using devices, such as a pacemaker, to correct heart rhythm problems.  Doing surgery to remove the part of the heart that sends bad signals.  Closing an area where clots can form in the heart (left atrial appendage). In some cases, your doctor will treat other underlying conditions. Follow these instructions at home: Medicines  Take over-the-counter and prescription medicines only as told by your doctor.  Do not take any new medicines without first talking to your doctor.  If you are taking blood thinners: ? Talk with your doctor before you take any medicines that have aspirin or NSAIDs, such as ibuprofen, in them. ? Take your medicine exactly as told by your doctor. Take it at the same time each day. ? Avoid activities that could hurt or bruise you. Follow instructions about how to prevent falls. ? Wear a bracelet that says you are taking blood thinners. Or, carry a card that lists what medicines you take. Lifestyle      Do not use any products that have nicotine or tobacco in them. These include cigarettes, e-cigarettes, and chewing tobacco. If you need help quitting, ask your doctor.  Eat heart-healthy foods. Talk with your doctor about the right eating plan for you.  Exercise regularly as told by your doctor.  Do  not drink alcohol.  Lose weight if you are overweight.  Do not use drugs, including cannabis. General instructions  If you have a condition that causes breathing to stop for a short period of time (apnea), treat it as told by your doctor.  Keep a healthy weight. Do not use diet pills unless your doctor says they are safe for you. Diet pills may make heart problems worse.  Keep all follow-up visits as told by your doctor. This is important. Contact a doctor if:  You notice a change in the speed, rhythm, or strength of your heartbeat.  You are taking a blood-thinning medicine and you get more bruising.  You get tired more easily when you move or exercise.  You have a sudden change in weight. Get help right away if:   You have pain in your chest or your belly (abdomen).  You have trouble breathing.  You have side effects of blood thinners, such as blood in your vomit, poop (stool), or pee (urine), or bleeding that cannot stop.  You have any signs of a stroke. "BE FAST" is an easy way to remember the main warning signs: ? B - Balance. Signs are dizziness, sudden trouble walking, or loss of balance.  Palpitations Palpitations are feelings that your heartbeat is irregular or is faster than normal. It may feel like your heart is fluttering or skipping a beat. Palpitations are usually not a serious problem. They may be caused by many things, including smoking, caffeine, alcohol, stress, and certain medicines or drugs. Most causes of palpitations are not serious. However, some palpitations can be a sign of a serious problem. You may need further tests to rule out serious medical problems. Follow these instructions at home:     Pay attention to any changes in your condition. Take these actions to help manage your symptoms: Eating and drinking Avoid foods and drinks that may cause palpitations. These may include: Caffeinated coffee, tea, soft drinks, diet pills, and energy  drinks. Chocolate. Alcohol. Lifestyle Take steps to reduce your stress and anxiety. Things that can help you relax include: Yoga. Mind-body activities, such as deep breathing, meditation, or using words and images to create positive thoughts (guided imagery). Physical activity, such as swimming, jogging, or walking. Tell your health care provider if your palpitations increase with activity. If you have chest pain or shortness of breath with activity, do not continue the activity until you are seen by your health care provider. Biofeedback. This is a method that helps you learn to use your mind to control things in your body, such as your heartbeat. Do not use drugs, including cocaine or ecstasy. Do not use marijuana. Get plenty of rest and sleep. Keep a regular bed time. General instructions Take over-the-counter and prescription medicines only as told by your health care provider. Do not use any products that contain nicotine or tobacco, such as cigarettes and e-cigarettes. If you need help quitting, ask your health care provider. Keep all follow-up visits as told by your health care provider. This is important. These may include visits for further testing if palpitations do not go away or get worse. Contact a health care provider if you: Continue to have a fast or irregular heartbeat after 24 hours. Notice that your palpitations occur more often. Get help right away if you: Have chest pain or shortness of breath. Have a severe headache. Feel dizzy or you faint. Summary Palpitations are feelings that your heartbeat is irregular or is faster  than normal. It may feel like your heart is fluttering or skipping a beat. Palpitations may be caused by many things, including smoking, caffeine, alcohol, stress, certain medicines, and drugs. Although most causes of palpitations are not serious, some causes can be a sign of a serious medical problem. Get help right away if you faint or have chest  pain, shortness of breath, a severe headache, or dizziness. This information is not intended to replace advice given to you by your health care provider. Make sure you discuss any questions you have with your health care provider. Document Revised: 05/11/2017 Document Reviewed: 05/11/2017 Elsevier Patient Education  [employer]. ? E - Eyes. Signs are trouble seeing or a change in how you see. ? F - Face. Signs are sudden weakness or loss of feeling in the face, or the face or eyelid drooping on one side. ? A - Arms. Signs are weakness or loss of feeling in an arm. This happens suddenly and usually on one side of the body. ? S - Speech. Signs are sudden trouble speaking, slurred speech, or trouble understanding what people say. ? T - Time. Time to call emergency services. Write down what time symptoms started.  You have other signs of a stroke, such as: ? A sudden, very bad headache with no known cause. ? Feeling like you may vomit (nausea). ? Vomiting. ? A seizure. These symptoms may be an emergency. Do not wait to see if the symptoms will go away. Get medical help right away. Call your local emergency services (911 in the [place] Do not drive yourself to the hospital. Summary  Atrial fibrillation is a type of heartbeat that is irregular or fast.  You are at higher risk of this condition if you smoke, are older, have diabetes, or are overweight.  Follow your doctor's instructions about medicines, diet, exercise, and follow-up visits.  Get help right away if you have signs or symptoms of a stroke.  Get help right away if you cannot catch your breath, or you have chest pain or discomfort. This information is not intended to replace advice given to you by your health care provider. Make sure you discuss any questions you have with your health care provider. Document Revised: 09/20/2018 Document Reviewed: 09/20/2018 Elsevier Patient Education  [employer].

## 2020-04-01 NOTE — Telephone Encounter (Signed)
Pt asking for f/u with [facility].

## 2020-04-01 NOTE — Patient Outreach (Signed)
[place] [facility]) Care Management  [place]  04/01/2020   [name] [birth year] [id number]  Notified by hospital liaison that member discharged from hospital.  Call placed to member, he report he is doing well.  State he was sent to the hospital this most recent time from the MD office due to having chest tightness and increased HR.  He denies any chest pain/discomfort since being discharged.  State he is monitoring blood pressure and heart rate daily, report HR less than 100.  Unable to provide specific numbers for blood pressure as he didn't write it down, but state it has been "good."  Reminded to document numbers and share with providers in effort to manage conditions appropriately.  He has follow up appointments with PCP tomorrow and with cardiology on 1/13.  He has called his oncology office to schedule follow up, awaiting call back.  Report he still has home health coming out to the home for dressing changes of his ankle/heal 3 times a week, going to wound care center every 2 weeks.  State wound is continuing to heal.  Blood sugars are managed, 121 today before breakfast.  State he has had lows of 50s a couple times overnight, denies eating bedtime snack consistently.  He verbalizes proper understanding of diabetic diet, state he is "trying" to follow.  Will be seen by endocrinologist on 1/26.    Denies any urgent concerns, encouraged to contact this care manager with questions.    Encounter Medications:  Outpatient Encounter Medications as of 04/01/2020  Medication Sig Note  . acetaminophen (TYLENOL) 650 MG CR tablet Take 650 mg by mouth every 8 (eight) hours as needed for pain.   [name] Kitchen albuterol (VENTOLIN HFA) 108 (90 Base) MCG/ACT inhaler Inhale 1-2 puffs into the lungs every 6 (six) hours as needed for wheezing or shortness of breath.   . Ascorbic Acid (VITAMIN C) 1000 MG tablet Take 1,000 mg by mouth daily.   . cholecalciferol (VITAMIN D) 1000 UNITS tablet Take  2,000 Units by mouth daily.   [name] Kitchen diltiazem (CARDIZEM CD) 240 MG 24 hr capsule Take 1 capsule (240 mg total) by mouth daily.   [name] Kitchen donepezil (ARICEPT) 10 MG tablet TAKE 1 TABLET BY MOUTH AT BEDTIME (Patient taking differently: Take 10 mg by mouth at bedtime.)   . fluticasone (FLONASE) 50 MCG/ACT nasal spray PLACE 2 SPRAYS IN EACH NOSTRIL DAILY (Patient taking differently: Place 1 spray into both nostrils daily.)   . guaiFENesin (MUCINEX) 600 MG 12 hr tablet Take 600 mg by mouth daily as needed for cough.   . Hyprom-Naphaz-Polysorb-Zn Sulf (CLEAR EYES COMPLETE OP) Place 1 drop into both eyes daily as needed (dry eyes).   . Insulin Pen Needle (PEN NEEDLES) 31G X 5 MM MISC 1 each by Does not apply route 3 (three) times daily. To inject insulin E11.42   . insulin regular human CONCENTRATED (HUMULIN R U-500 KWIKPEN) 500 UNIT/ML kwikpen Inject 40 Units into the skin with breakfast, with lunch, and with evening meal. 03/11/2020: Took 50 units today 03-11-20  . memantine (NAMENDA) 10 MG tablet TAKE 1 TABLET BY MOUTH TWICE A DAY (Patient taking differently: Take 10 mg by mouth 2 (two) times daily.)   . montelukast (SINGULAIR) 10 MG tablet Take 1 tablet (10 mg total) by mouth at bedtime.   . pantoprazole (PROTONIX) 20 MG tablet Take 1 tablet (20 mg total) by mouth daily.   . prochlorperazine (COMPAZINE) 10 MG tablet Take 1 tablet (10 mg total)  by mouth every 6 (six) hours as needed. (Patient taking differently: Take 10 mg by mouth every 6 (six) hours as needed for nausea or vomiting.)   . Semaglutide (OZEMPIC, 0.25 OR 0.5 MG/DOSE, [place]) Inject 0.25 mg into the skin once a week. "Sundays   . simvastatin (ZOCOR) 40 MG tablet TAKE 1 TABLET BY MOUTH AT BEDTIME (Patient taking differently: Take 40 mg by mouth daily at 6 PM.)   . venlafaxine XR (EFFEXOR-XR) 75 MG 24 hr capsule TAKE 1 CAPSULE BY MOUTH EVERY MORNING AND 2 CAPSULES AT BEDTIME (Patient taking differently: Take 75 mg by mouth See admin instructions. Takes 1  capsule in the morning and 2 capsules at night.)    No facility-administered encounter medications on file as of 04/01/2020.    Functional Status:  In your present state of health, do you have any difficulty performing the following activities: 03/25/2020 03/11/2020  Hearing? N N  Vision? N N  Difficulty concentrating or making decisions? N N  Walking or climbing stairs? Y Y  Comment secondary to shortness of breath -  Dressing or bathing? N N  Doing errands, shopping? N Y  Preparing Food and eating ? - -  Using the Toilet? - -  In the past six months, have you accidently leaked urine? - -  Comment - -  Do you have problems with loss of bowel control? - -  Managing your Medications? - -  Managing your Finances? - -  Housekeeping or managing your Housekeeping? - -  Some recent data might be hidden    Fall/Depression Screening: Fall Risk  01/09/2020 09/26/2019 01/03/2019  Falls in the past year? 0 1 1  Comment - - -  Number falls in past yr: 0 0 1  Comment - - -  Injury with Fall? 0 0 0  Risk Factor Category  - - -  Risk for fall due to : Medication side effect - History of fall(s);Impaired mobility;Impaired balance/gait;Medication side effect  Follow up Falls evaluation completed;Falls prevention discussed Falls prevention discussed Falls evaluation completed;Falls prevention discussed   PHQ 2/9 Scores 01/09/2020 09/26/2019 01/03/2019 12/26/2017 08/03/2017 12/09/2016 08/26/2015  PHQ - 2 Score 0 0 0 5 5 2 2  PHQ- 9 Score 0 - 0 12 13 6 -    Assessment:  Goals Addressed            This Visit's Progress   . [facility] - Careful Skin Care   On track    Follow Up Date - 04/15/2020  Timeframe:  Short-Term Goal Priority:  Medium Start Date:       12" /21/2021                      Expected End Date:   05/02/2020                      - clean and dry skin well - use lotion that is suggested by the doctor or nurse    Why is this important?   A rash or skin blisters are common when you have  GVHD.   Taking really good care of your skin will help to keep your skin unbroken.    Notes:   11/3 - Wound still in the process of healing  12/7 - Reviewed ongoing wound healing and dressing changes, remain with home health involved  12/21 - Confirmed still active with wound care center and home health for wound care    .  [facility] - Make and Keep All Appointments   On track    Follow Up Date 04/15/2020  Timeframe:  Short-Term Goal Priority:  Medium Start Date:         04/01/2020                    Expected End Date:      05/02/2020                   - call to cancel if needed - keep a calendar with appointment dates    Why is this important?   Part of staying healthy is seeing the doctor for follow-up care.  If you forget your appointments, there are some things you can do to stay on track.    Notes:   Continues with cancer treatment appointments  12/7 - Upcoming appointments reviewed, assessed need for transportation assistance, none needed  12/21 - Reviewed appointment calender, encouraged to call oncology to schedule office visit    . [facility] - Manage Fatigue (Tiredness)   On track    Follow Up Date 1/30   - develop a new routine to improve sleep - eat healthy - maintain healthy weight    Why is this important?   Cancer treatment and its side effects can drain your energy. It can keep you from doing things you would like to do.  There are many things that you can do to manage fatigue.    Notes:   Re-educated on slowly increasing activity to decrease fatigue  12/7 - Discussed overall increasing activity and developing exercise routine    . [facility] - Track and Manage Heart Rate and Rhythm-Atrial Fibrillation   On track    Timeframe:  Short-Term Goal Priority:  Medium Start Date:  03/18/2020                          Expected End Date:      04/18/2020                    - check pulse (heart) rate before taking medicine - check pulse (heart) rate once a day - make a plan to  exercise regularly - keep all lab appointments - take medicine as prescribed    Why is this important?    Atrial fibrillation may have no symptoms. Sometimes the symptoms get worse or happen more often.   It is important to keep track of what your symptoms are and when they happen.   A change in symptoms is important to discuss with your doctor or nurse.   Being active and healthy eating will also help you manage your heart condition.     Notes:   12/21 - Reminded to keep log of BP and HR daily       Plan:  Follow-up:  Patient agrees to Care Plan and Follow-up.  Will send education regarding A-fib and HR management.  Will follow up within the next 2 weeks.  [name], [place], MSN [place] [phone]

## 2020-04-01 NOTE — Telephone Encounter (Signed)
Yes.  Please reschedule him for follow-up appointment with me next week or the following week.  Thank you.

## 2020-04-02 ENCOUNTER — Telehealth: Payer: Self-pay | Admitting: Internal Medicine

## 2020-04-02 ENCOUNTER — Ambulatory Visit (INDEPENDENT_AMBULATORY_CARE_PROVIDER_SITE_OTHER): Payer: Medicare Other | Admitting: Family Medicine

## 2020-04-02 ENCOUNTER — Encounter: Payer: Self-pay | Admitting: Family Medicine

## 2020-04-02 ENCOUNTER — Other Ambulatory Visit: Payer: Self-pay

## 2020-04-02 VITALS — BP 118/60 | HR 104 | Temp 97.8°F | Ht 71.0 in | Wt 181.5 lb

## 2020-04-02 DIAGNOSIS — L97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scle: Secondary | ICD-10-CM | POA: Diagnosis not present

## 2020-04-02 DIAGNOSIS — R000 Tachycardia, unspecified: Secondary | ICD-10-CM | POA: Diagnosis not present

## 2020-04-02 DIAGNOSIS — J45909 Unspecified asthma, uncomplicated: Secondary | ICD-10-CM | POA: Diagnosis not present

## 2020-04-02 DIAGNOSIS — Z87891 Personal history of nicotine dependence: Secondary | ICD-10-CM | POA: Diagnosis not present

## 2020-04-02 DIAGNOSIS — I129 Hypertensive chronic kidney disease with stage 1 through stage 4 chronic kidney disease, or unspecified chronic kidney disease: Secondary | ICD-10-CM | POA: Diagnosis not present

## 2020-04-02 DIAGNOSIS — E1122 Type 2 diabetes mellitus with diabetic chronic kidney disease: Secondary | ICD-10-CM | POA: Diagnosis not present

## 2020-04-02 DIAGNOSIS — I4891 Unspecified atrial fibrillation: Secondary | ICD-10-CM

## 2020-04-02 DIAGNOSIS — Z794 Long term (current) use of insulin: Secondary | ICD-10-CM | POA: Diagnosis not present

## 2020-04-02 DIAGNOSIS — E11621 Type 2 diabetes mellitus with foot ulcer: Secondary | ICD-10-CM | POA: Diagnosis not present

## 2020-04-02 DIAGNOSIS — E1142 Type 2 diabetes mellitus with diabetic polyneuropathy: Secondary | ICD-10-CM

## 2020-04-02 DIAGNOSIS — E43 Unspecified severe protein-calorie malnutrition: Secondary | ICD-10-CM

## 2020-04-02 DIAGNOSIS — L97512 Non-pressure chronic ulcer of other part of right foot with fat layer exposed: Secondary | ICD-10-CM | POA: Diagnosis not present

## 2020-04-02 DIAGNOSIS — IMO0002 Reserved for concepts with insufficient information to code with codable children: Secondary | ICD-10-CM

## 2020-04-02 DIAGNOSIS — C3491 Malignant neoplasm of unspecified part of right bronchus or lung: Secondary | ICD-10-CM

## 2020-04-02 DIAGNOSIS — I4892 Unspecified atrial flutter: Secondary | ICD-10-CM | POA: Diagnosis not present

## 2020-04-02 DIAGNOSIS — E1165 Type 2 diabetes mellitus with hyperglycemia: Secondary | ICD-10-CM

## 2020-04-02 DIAGNOSIS — N183 Chronic kidney disease, stage 3 unspecified: Secondary | ICD-10-CM | POA: Diagnosis not present

## 2020-04-02 DIAGNOSIS — N1831 Chronic kidney disease, stage 3a: Secondary | ICD-10-CM | POA: Diagnosis not present

## 2020-04-02 DIAGNOSIS — G4733 Obstructive sleep apnea (adult) (pediatric): Secondary | ICD-10-CM | POA: Diagnosis not present

## 2020-04-02 DIAGNOSIS — E041 Nontoxic single thyroid nodule: Secondary | ICD-10-CM | POA: Diagnosis not present

## 2020-04-02 DIAGNOSIS — Z9181 History of falling: Secondary | ICD-10-CM | POA: Diagnosis not present

## 2020-04-02 DIAGNOSIS — D61810 Antineoplastic chemotherapy induced pancytopenia: Secondary | ICD-10-CM | POA: Diagnosis not present

## 2020-04-02 DIAGNOSIS — I480 Paroxysmal atrial fibrillation: Secondary | ICD-10-CM | POA: Diagnosis not present

## 2020-04-02 NOTE — Telephone Encounter (Signed)
Scheduled appt per 12/22 sch msg - pt is aware of apt date and time

## 2020-04-02 NOTE — Progress Notes (Signed)
[name] T. [name], MD, [place]  [facility] and [place] at [facility] [place], [address]  Phone: [phone]  FAX: [phone]  [name] - 74 y.o. male  [name] [id number]  Date of Birth: 12-31-1945  Date: 04/02/2020  PCP: [name], MD  Referral: [name], MD  Chief Complaint  Patient presents with  . Hospitalization Follow-up    This visit occurred during the SARS-CoV-2 public health emergency.  Safety protocols were in place, including screening questions prior to the visit, additional usage of staff PPE, and extensive cleaning of exam room while observing appropriate contact time as indicated for disinfecting solutions.   Subjective:   [name] is a 74 y.o. very pleasant male patient with Body mass index is 25.31 kg/m. who presents with the following:  TCM 7  hosp f/u: I actually saw him on March 24, 2020, and at that point I had him go directly to the hospital when he was in A. fib with RVR.  He has been having a lot of problems with chemotherapy-induced anemia as well requiring multiple transfusions.  Admit date: 03/24/2020 Discharge date: 03/27/2020  Admitted From: Home Disposition:  Home  F/u with Dr. Julien [name] in 1 week after hosp d/c  F/u Dr. Radford [name] in 2-3 weeks  He has lung cancer of the R lung s/p rads and chemi.  He was admitted with AF with RVR Now on daily cardizem  Acute hypoxic resp failure AF, non-small cell carcinoma  He also has wound care f/u This is for diabetic foot ulcers.  He has lost a significant amount of weight. His mother has advanced Alzheimer's disease, and she is doing very poorly. He has basically not able to care for her at this point.  He does have some grandchildren who are helping at home now.  Review of Systems is noted in the HPI, as appropriate  Patient Active Problem List   Diagnosis Date Noted  . Non-small  cell carcinoma of right lung, stage 3 ([place]) 10/23/2019    Priority: High  . Uncontrolled type 2 diabetes mellitus with peripheral neuropathy ([place]) 05/11/2016    Priority: Medium  . Former very heavy cigarette smoker (more than 40 per day) 10/07/2014    Priority: Medium  . Chronic kidney disease, stage III (moderate) ([place]) 12/19/2012    Priority: Medium  . OSA (obstructive sleep apnea) 03/09/2011    Priority: Medium  . Diabetic neuropathy ([place]) 12/29/2010    Priority: Medium  . Hypertriglyceridemia 06/13/2008    Priority: Medium  . Major depressive disorder, recurrent episode, in partial remission ([place]) 06/13/2008    Priority: Medium  . Essential hypertension 06/13/2008    Priority: Medium  . Prolonged QT interval 03/25/2020  . Paroxysmal atrial fibrillation with RVR ([place]) 03/24/2020  . Atrial fibrillation with RVR ([place]) 03/24/2020  . Secondary hypercoagulable state ([place]) 03/19/2020  . Typical atrial flutter ([place]) 03/11/2020  . Antineoplastic chemotherapy induced pancytopenia ([place]) 03/11/2020  . Severe protein-calorie malnutrition ([place]) 02/12/2020  . Chemotherapy induced neutropenia ([place])   . Severe sepsis ([place]) 11/22/2019  . Neutropenic fever ([place]) 11/22/2019  . Goals of care, counseling/discussion 10/23/2019  . Encounter for antineoplastic chemotherapy 10/23/2019  . Small cell lung cancer ([place]) 10/12/2019  . Dropfoot 03/15/2019  . Mild cognitive impairment 05/08/2018  . Esophageal dysphagia   . Diabetic foot ulcer ([place]) 06/10/2017  . Erectile dysfunction associated with type 2 diabetes mellitus ([place]) 05/28/2015  .  Hypogonadism male 06/14/2012  . Family history of MI (myocardial infarction) 09/16/2010  . HIP REPLACEMENT, RIGHT, HX OF 02/05/2010  . GOUT 06/13/2008  . GERD 06/13/2008  . OSTEOARTHRITIS 06/13/2008    Past Medical History:  Diagnosis Date  . Allergic rhinitis due to pollen   . Chronic kidney disease, stage III (moderate) ([place]) 12/19/2012  . Depression   .  Diabetes mellitus ([place])   . Diverticulosis   . Erectile dysfunction associated with type 2 diabetes mellitus ([place]) 05/28/2015  . Former very heavy cigarette smoker (more than 40 per day) 10/07/2014  . GERD (gastroesophageal reflux disease)   . Gout   . Hyperlipidemia   . Hypertension   . Hypogonadism male 06/14/2012  . Memory loss   . Osteoarthritis   . Personal history of colonic polyps    1996  . Small cell carcinoma of right lung ([place]) 10/23/2019    Past Surgical History:  Procedure Laterality Date  . BRONCHIAL NEEDLE ASPIRATION BIOPSY  10/17/2019   Procedure: BRONCHIAL NEEDLE ASPIRATION BIOPSIES;  Surgeon: Juanito Doom, MD;  Location: [facility];  Service: Cardiopulmonary;;  . CARDIAC CATHETERIZATION  11/2011   [facility]  . CHOLECYSTECTOMY  1980  . ENDOBRONCHIAL ULTRASOUND Bilateral 10/17/2019   Procedure: ENDOBRONCHIAL ULTRASOUND;  Surgeon: Juanito Doom, MD;  Location: [facility];  Service: Cardiopulmonary;  Laterality: Bilateral;  . ESOPHAGOGASTRODUODENOSCOPY (EGD) WITH PROPOFOL N/A 09/27/2017   Procedure: ESOPHAGOGASTRODUODENOSCOPY (EGD) WITH PROPOFOL;  Surgeon: Lin Landsman, MD;  Location: [facility];  Service: Gastroenterology;  Laterality: N/A;  . EYE SURGERY Right 07/29/2016   [facility] - Dr. Manuella Ghazi  . EYE SURGERY Left 08/23/2106   cataract - Dr. Manuella Ghazi  . HEMOSTASIS CONTROL  10/17/2019   Procedure: HEMOSTASIS CONTROL;  Surgeon: Juanito Doom, MD;  Location: [facility];  Service: Cardiopulmonary;;  . IR IMAGING GUIDED PORT INSERTION  11/30/2019  . IRRIGATION AND DEBRIDEMENT ABSCESS Left 11/23/2019   Procedure: IRRIGATION AND DEBRIDEMENT ABSCESS;  Surgeon: Melina Schools, MD;  Location: [facility];  Service: Orthopedics;  Laterality: Left;  . TOTAL HIP ARTHROPLASTY  2009   Dr. Gladstone Lighter  . TOTAL HIP ARTHROPLASTY      Family History  Problem Relation Age of Onset  . Diabetes Mother   . Alzheimer's disease Mother   . Diabetes Father   . Lung cancer Father         Age 48  . Stroke Father   . Heart attack Father   . Lung cancer Sister        lung  . Heart disease Maternal Grandfather   . COPD Sister   . Heart attack Sister   . Heart disease Sister      Objective:   BP 118/60   Pulse (!) 104   Temp 97.8 F (36.6 C) (Temporal)   Ht 5\' 11"  (1.803 m)   Wt 181 lb 8 oz (82.3 kg)   SpO2 94%   BMI 25.31 kg/m   GEN: No acute distress; alert,appropriate. PULM: Breathing comfortably in no respiratory distress PSYCH: Normally interactive.  CV: Irregularly irregular. Rate approximately 100. I do not hear any murmurs. PULM: Patient does have rails on his posterior midpoint of his lung on the right. I do not appreciate any generalized wheezing.  Laboratory and Imaging Data: CLINICAL DATA:  Primary Cancer Type: Lung  Imaging Indication: Assess response to therapy  Interval therapy since last imaging? Yes  Initial Cancer Diagnosis  Date: 10/17/2019; Established by: Biopsy-proven  Detailed Pathology: Limited stage small cell  lung cancer.  Primary Tumor location:  Right lower lobe/right hilum.  Surgeries: Cardiac catheterization. Cholecystectomy.  Chemotherapy: Yes; Ongoing? Yes; Most recent administration: 03/03/2020  Immunotherapy? No  Radiation therapy? Yes; Date Range: 10/18/2019 - 11/28/2019; Target: Right lung  EXAM: CT CHEST WITH CONTRAST  TECHNIQUE: Multidetector CT imaging of the chest was performed during intravenous contrast administration.  CONTRAST:  51mL OMNIPAQUE IOHEXOL 300 MG/ML  SOLN  COMPARISON:  Most recent CT chest 02/07/2020.  11/01/2019 PET-CT.  FINDINGS: Cardiovascular: Normal heart size. No significant pericardial effusion/thickening. Three-vessel coronary atherosclerosis. Atherosclerotic nonaneurysmal thoracic aorta. Normal caliber pulmonary arteries. No central pulmonary emboli. Right internal jugular Port-A-Cath terminates at the cavoatrial junction.  Mediastinum/Nodes:  Hypodense 1.2 cm right thyroid nodule, stable. Not clinically significant; no follow-up imaging recommended (ref: J Am Coll Radiol. 2015 Feb;12(2): 143-50). Unremarkable esophagus. No axillary adenopathy. Enlarged 2.1 cm short axis diameter right paratracheal node (series 2/image 51), previously 2.1 cm, stable. Enlarged 1.1 cm right subcarinal node (series 2/image 71), previously 1.4 cm, slightly decreased. No new pathologically enlarged mediastinal nodes. No left hilar adenopathy.  Lungs/Pleura: No pneumothorax. Small to moderate right pleural effusion, new. Small dependent left pleural effusion, new. Poorly marginated masslike posterior right perihilar consolidation measuring 6.1 x 5.7 cm (series 2/image 61), increased from 4.7 x 3.4 cm. Extensive surrounding patchy consolidation, ground-glass opacity, septal thickening throughout the right lung, with a sharp inferior margin, substantially increased, compatible with evolving postradiation change. No new significant pulmonary nodules.  Upper abdomen: Cholecystectomy.  Musculoskeletal: No aggressive appearing focal osseous lesions. Moderate thoracic spondylosis.  IMPRESSION: 1. Poorly marginated masslike posterior right perihilar lung consolidation has increased. Given the extensive increased patchy consolidation and ground-glass opacity in the surrounding right lung, these findings may all represent evolving post radiation change, although progression of the perihilar right lung malignancy is difficult to exclude. PET-CT suggested for further characterization at this time. 2. Right paratracheal and subcarinal lymphadenopathy is stable to decreased. 3. New small to moderate right and small dependent left pleural effusions. 4. Aortic Atherosclerosis (ICD10-I70.0).   Electronically Signed   By: Ilona Sorrel M.D.   On: 03/21/2020 11:19  CLINICAL DATA:  Tachycardia for 3 weeks, shortness of breath, intermittent chest  pain, question pulmonary embolism; history of lung cancer post chemotherapy and radiation therapy, history hypertension, type II diabetes mellitus, stage III chronic kidney disease, former smoker  EXAM: CT ANGIOGRAPHY CHEST WITH CONTRAST  TECHNIQUE: Multidetector CT imaging of the chest was performed using the standard protocol during bolus administration of intravenous contrast. Multiplanar CT image reconstructions and MIPs were obtained to evaluate the vascular anatomy.  CONTRAST:  101mL OMNIPAQUE IOHEXOL 350 MG/ML SOLN IV  COMPARISON:  CT chest 03/21/2020  FINDINGS: Cardiovascular: Atherosclerotic calcifications of aorta, proximal great vessels and coronary arteries. Aorta normal caliber without aneurysm or dissection. RIGHT jugular Port-A-Cath with tip in RIGHT atrium. Cardiac chambers unremarkable. No pericardial effusion. Pulmonary arteries adequately opacified. Few scattered respiratory motion artifacts are present, notably LEFT lower lobe. No definite pulmonary emboli identified.  Mediastinum/Nodes: 12 mm nodule RIGHT thyroid lobe; not clinically significant; no follow-up imaging recommended (ref: J Am Coll Radiol. 2015 Feb;12(2): 143-50).2.2 cm RIGHT paratracheal node image 51 unchanged. Additional scattered normal size mediastinal nodes. Previously measured subcarinal node less well delineated due to minimal infiltration. Esophagus unremarkable.  Lungs/Pleura: BILATERAL pleural effusions increased from previous exam, partially loculated on RIGHT. Cavitary lesion and posterior RIGHT upper lobe again seen. Perihilar consolidation again seen. Narrowing of bronchus intermedius and proximal RIGHT middle lobe/RIGHT lower  lobe bronchi. Narrowing of RIGHT upper lobe bronchus. Extensive infiltrate RIGHT upper lobe and superior segment RIGHT lower lobe unchanged likely due to radiation. Scattered areas of atelectasis little changed. Remaining LEFT lung clear.  No pneumothorax.  Upper Abdomen: Prior cholecystectomy. No acute upper abdominal abnormalities.  Musculoskeletal: No definite osseous lesions. Scattered degenerative disc disease changes thoracic spine.  Review of the MIP images confirms the above findings.  IMPRESSION: No definite evidence of pulmonary embolism.  Stable RIGHT paratracheal adenopathy.  Increased BILATERAL pleural effusions with compressive atelectasis of the adjacent lower lobes.  Persistent perihilar consolidation RIGHT lung with narrowing of the origins of the lobar bronchi.  Cavitary focus RIGHT upper lobe unchanged.  Extensive infiltrates throughout RIGHT upper lobe and at superior segment of RIGHT lower lobe question related to radiation therapy.  Aortic Atherosclerosis (ICD10-I70.0).   Electronically Signed   By: Lavonia Dana M.D.   On: 03/24/2020 16:27  Assessment and Plan:     ICD-10-CM   1. Atrial fibrillation with RVR (HCC)  I48.91   2. Non-small cell carcinoma of right lung, stage 3 (HCC)  C34.91   3. Stage 3 chronic kidney disease, unspecified whether stage 3a or 3b CKD (HCC)  N18.30   4. Uncontrolled type 2 diabetes mellitus with peripheral neuropathy (HCC)  E11.42    E11.65   5. Non-small cell cancer of right lung [facility])  C34.91    Challenging case in a patient with very significant lung cancer. Repeated bouts of anemia secondary to chemotherapy.  Recurrence of A. fib with RVR requiring hospitalization and diltiazem for rate control.  Diabetes has historically been severely out of control. It has become somewhat more normalized after his loss in which way, but it is still not in good shape.  He is also intermittently been fairly noncompliant historically.  He also has an ongoing wound being managed by wound management care.  He has close follow-up with both oncology and cardiology.  I encouraged him to continue to eat and take in more calories, while at the same time at  least trying to tolerate and manage diabetic diet.  We talked about his CT scan of his chest, and at least it does not appear to be getting better significantly. Hopefully Dr. Earlie Server will be able to talk to him more at length about goals and options.  Follow-up: Return in about 3 months (around 07/01/2020).  Signed,  Maud Deed. Jakeem Grape, MD   Outpatient Encounter Medications as of 04/02/2020  Medication Sig  . acetaminophen (TYLENOL) 650 MG CR tablet Take 650 mg by mouth every 8 (eight) hours as needed for pain.  [name] Kitchen albuterol (VENTOLIN HFA) 108 (90 Base) MCG/ACT inhaler Inhale 1-2 puffs into the lungs every 6 (six) hours as needed for wheezing or shortness of breath.  . Ascorbic Acid (VITAMIN C) 1000 MG tablet Take 1,000 mg by mouth daily.  . cholecalciferol (VITAMIN D) 1000 UNITS tablet Take 2,000 Units by mouth daily.  [name] Kitchen diltiazem (CARDIZEM CD) 240 MG 24 hr capsule Take 1 capsule (240 mg total) by mouth daily.  [name] Kitchen donepezil (ARICEPT) 10 MG tablet TAKE 1 TABLET BY MOUTH AT BEDTIME (Patient taking differently: Take 10 mg by mouth at bedtime.)  . fluticasone (FLONASE) 50 MCG/ACT nasal spray PLACE 2 SPRAYS IN EACH NOSTRIL DAILY (Patient taking differently: Place 1 spray into both nostrils daily.)  . guaiFENesin (MUCINEX) 600 MG 12 hr tablet Take 600 mg by mouth daily as needed for cough.  . Hyprom-Naphaz-Polysorb-Zn Sulf (CLEAR EYES COMPLETE  OP) Place 1 drop into both eyes daily as needed (dry eyes).  . Insulin Pen Needle (PEN NEEDLES) 31G X 5 MM MISC 1 each by Does not apply route 3 (three) times daily. To inject insulin E11.42  . insulin regular human CONCENTRATED (HUMULIN R U-500 KWIKPEN) 500 UNIT/ML kwikpen Inject 40 Units into the skin with breakfast, with lunch, and with evening meal.  . memantine (NAMENDA) 10 MG tablet TAKE 1 TABLET BY MOUTH TWICE A DAY (Patient taking differently: Take 10 mg by mouth 2 (two) times daily.)  . montelukast (SINGULAIR) 10 MG tablet Take 1 tablet (10 mg  total) by mouth at bedtime.  . pantoprazole (PROTONIX) 20 MG tablet Take 1 tablet (20 mg total) by mouth daily.  . prochlorperazine (COMPAZINE) 10 MG tablet Take 1 tablet (10 mg total) by mouth every 6 (six) hours as needed. (Patient taking differently: Take 10 mg by mouth every 6 (six) hours as needed for nausea or vomiting.)  . Semaglutide (OZEMPIC, 0.25 OR 0.5 MG/DOSE, [place]) Inject 0.25 mg into the skin once a week. Sundays  . simvastatin (ZOCOR) 40 MG tablet TAKE 1 TABLET BY MOUTH AT BEDTIME (Patient taking differently: Take 40 mg by mouth daily at 6 PM.)  . venlafaxine XR (EFFEXOR-XR) 75 MG 24 hr capsule TAKE 1 CAPSULE BY MOUTH EVERY MORNING AND 2 CAPSULES AT BEDTIME (Patient taking differently: Take 75 mg by mouth See admin instructions. Takes 1 capsule in the morning and 2 capsules at night.)   No facility-administered encounter medications on file as of 04/02/2020.

## 2020-04-04 ENCOUNTER — Encounter: Payer: Medicare Other | Admitting: Physician Assistant

## 2020-04-04 ENCOUNTER — Other Ambulatory Visit: Payer: Self-pay

## 2020-04-04 DIAGNOSIS — M21372 Foot drop, left foot: Secondary | ICD-10-CM | POA: Diagnosis not present

## 2020-04-04 DIAGNOSIS — E1142 Type 2 diabetes mellitus with diabetic polyneuropathy: Secondary | ICD-10-CM | POA: Diagnosis not present

## 2020-04-04 DIAGNOSIS — C3490 Malignant neoplasm of unspecified part of unspecified bronchus or lung: Secondary | ICD-10-CM | POA: Diagnosis not present

## 2020-04-04 DIAGNOSIS — E11621 Type 2 diabetes mellitus with foot ulcer: Secondary | ICD-10-CM | POA: Diagnosis not present

## 2020-04-04 DIAGNOSIS — Z794 Long term (current) use of insulin: Secondary | ICD-10-CM | POA: Diagnosis not present

## 2020-04-04 DIAGNOSIS — L97511 Non-pressure chronic ulcer of other part of right foot limited to breakdown of skin: Secondary | ICD-10-CM | POA: Diagnosis not present

## 2020-04-04 DIAGNOSIS — L97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scle: Secondary | ICD-10-CM | POA: Diagnosis not present

## 2020-04-04 DIAGNOSIS — L89899 Pressure ulcer of other site, unspecified stage: Secondary | ICD-10-CM | POA: Diagnosis not present

## 2020-04-07 ENCOUNTER — Telehealth: Payer: Self-pay | Admitting: Medical Oncology

## 2020-04-07 ENCOUNTER — Other Ambulatory Visit: Payer: Self-pay

## 2020-04-07 ENCOUNTER — Telehealth: Payer: Self-pay | Admitting: Internal Medicine

## 2020-04-07 ENCOUNTER — Inpatient Hospital Stay (HOSPITAL_BASED_OUTPATIENT_CLINIC_OR_DEPARTMENT_OTHER): Payer: Medicare Other | Admitting: Internal Medicine

## 2020-04-07 ENCOUNTER — Inpatient Hospital Stay: Payer: Medicare Other

## 2020-04-07 ENCOUNTER — Encounter: Payer: Self-pay | Admitting: Internal Medicine

## 2020-04-07 VITALS — BP 110/68 | HR 79 | Temp 97.0°F | Resp 18 | Ht 71.0 in | Wt 179.5 lb

## 2020-04-07 DIAGNOSIS — I480 Paroxysmal atrial fibrillation: Secondary | ICD-10-CM | POA: Diagnosis not present

## 2020-04-07 DIAGNOSIS — I10 Essential (primary) hypertension: Secondary | ICD-10-CM

## 2020-04-07 DIAGNOSIS — Z5111 Encounter for antineoplastic chemotherapy: Secondary | ICD-10-CM

## 2020-04-07 DIAGNOSIS — C3431 Malignant neoplasm of lower lobe, right bronchus or lung: Secondary | ICD-10-CM | POA: Diagnosis not present

## 2020-04-07 DIAGNOSIS — Z9181 History of falling: Secondary | ICD-10-CM | POA: Diagnosis not present

## 2020-04-07 DIAGNOSIS — L97512 Non-pressure chronic ulcer of other part of right foot with fat layer exposed: Secondary | ICD-10-CM | POA: Diagnosis not present

## 2020-04-07 DIAGNOSIS — E1122 Type 2 diabetes mellitus with diabetic chronic kidney disease: Secondary | ICD-10-CM | POA: Diagnosis not present

## 2020-04-07 DIAGNOSIS — N1831 Chronic kidney disease, stage 3a: Secondary | ICD-10-CM | POA: Diagnosis not present

## 2020-04-07 DIAGNOSIS — E1142 Type 2 diabetes mellitus with diabetic polyneuropathy: Secondary | ICD-10-CM | POA: Diagnosis not present

## 2020-04-07 DIAGNOSIS — D61810 Antineoplastic chemotherapy induced pancytopenia: Secondary | ICD-10-CM | POA: Diagnosis not present

## 2020-04-07 DIAGNOSIS — C3490 Malignant neoplasm of unspecified part of unspecified bronchus or lung: Secondary | ICD-10-CM | POA: Diagnosis not present

## 2020-04-07 DIAGNOSIS — Z794 Long term (current) use of insulin: Secondary | ICD-10-CM | POA: Diagnosis not present

## 2020-04-07 DIAGNOSIS — J45909 Unspecified asthma, uncomplicated: Secondary | ICD-10-CM | POA: Diagnosis not present

## 2020-04-07 DIAGNOSIS — I129 Hypertensive chronic kidney disease with stage 1 through stage 4 chronic kidney disease, or unspecified chronic kidney disease: Secondary | ICD-10-CM | POA: Diagnosis not present

## 2020-04-07 DIAGNOSIS — N183 Chronic kidney disease, stage 3 unspecified: Secondary | ICD-10-CM | POA: Diagnosis not present

## 2020-04-07 DIAGNOSIS — Z8719 Personal history of other diseases of the digestive system: Secondary | ICD-10-CM | POA: Diagnosis not present

## 2020-04-07 DIAGNOSIS — G4733 Obstructive sleep apnea (adult) (pediatric): Secondary | ICD-10-CM | POA: Diagnosis not present

## 2020-04-07 DIAGNOSIS — E041 Nontoxic single thyroid nodule: Secondary | ICD-10-CM | POA: Diagnosis not present

## 2020-04-07 DIAGNOSIS — C3491 Malignant neoplasm of unspecified part of right bronchus or lung: Secondary | ICD-10-CM | POA: Diagnosis not present

## 2020-04-07 DIAGNOSIS — I4892 Unspecified atrial flutter: Secondary | ICD-10-CM | POA: Diagnosis not present

## 2020-04-07 DIAGNOSIS — R000 Tachycardia, unspecified: Secondary | ICD-10-CM | POA: Diagnosis not present

## 2020-04-07 DIAGNOSIS — Z87891 Personal history of nicotine dependence: Secondary | ICD-10-CM | POA: Diagnosis not present

## 2020-04-07 DIAGNOSIS — L97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scle: Secondary | ICD-10-CM | POA: Diagnosis not present

## 2020-04-07 DIAGNOSIS — I4891 Unspecified atrial fibrillation: Secondary | ICD-10-CM | POA: Diagnosis not present

## 2020-04-07 DIAGNOSIS — D649 Anemia, unspecified: Secondary | ICD-10-CM

## 2020-04-07 DIAGNOSIS — Z79899 Other long term (current) drug therapy: Secondary | ICD-10-CM | POA: Diagnosis not present

## 2020-04-07 DIAGNOSIS — E11621 Type 2 diabetes mellitus with foot ulcer: Secondary | ICD-10-CM | POA: Diagnosis not present

## 2020-04-07 DIAGNOSIS — I1310 Hypertensive heart and chronic kidney disease without heart failure, with stage 1 through stage 4 chronic kidney disease, or unspecified chronic kidney disease: Secondary | ICD-10-CM | POA: Diagnosis not present

## 2020-04-07 DIAGNOSIS — Z9221 Personal history of antineoplastic chemotherapy: Secondary | ICD-10-CM | POA: Diagnosis not present

## 2020-04-07 LAB — CMP (CANCER CENTER ONLY)
ALT: 16 U/L (ref 0–44)
AST: 13 U/L — ABNORMAL LOW (ref 15–41)
Albumin: 2.2 g/dL — ABNORMAL LOW (ref 3.5–5.0)
Alkaline Phosphatase: 109 U/L (ref 38–126)
Anion gap: 8 (ref 5–15)
BUN: 19 mg/dL (ref 8–23)
CO2: 31 mmol/L (ref 22–32)
Calcium: 8.7 mg/dL — ABNORMAL LOW (ref 8.9–10.3)
Chloride: 95 mmol/L — ABNORMAL LOW (ref 98–111)
Creatinine: 1.48 mg/dL — ABNORMAL HIGH (ref 0.61–1.24)
GFR, Estimated: 49 mL/min — ABNORMAL LOW (ref >60)
Glucose, Bld: 286 mg/dL — ABNORMAL HIGH (ref 70–99)
Potassium: 4 mmol/L (ref 3.5–5.1)
Sodium: 134 mmol/L — ABNORMAL LOW (ref 135–145)
Total Bilirubin: 0.5 mg/dL (ref 0.3–1.2)
Total Protein: 6.9 g/dL (ref 6.5–8.1)

## 2020-04-07 LAB — CBC WITH DIFFERENTIAL (CANCER CENTER ONLY)
Abs Immature Granulocytes: 0.1 10*3/uL — ABNORMAL HIGH (ref 0.00–0.07)
Basophils Absolute: 0 10*3/uL (ref 0.0–0.1)
Basophils Relative: 0 %
Eosinophils Absolute: 0 10*3/uL (ref 0.0–0.5)
Eosinophils Relative: 0 %
HCT: 24.6 % — ABNORMAL LOW (ref 39.0–52.0)
Hemoglobin: 8.1 g/dL — ABNORMAL LOW (ref 13.0–17.0)
Immature Granulocytes: 1 %
Lymphocytes Relative: 7 %
Lymphs Abs: 0.6 10*3/uL — ABNORMAL LOW (ref 0.7–4.0)
MCH: 29.8 pg (ref 26.0–34.0)
MCHC: 32.9 g/dL (ref 30.0–36.0)
MCV: 90.4 fL (ref 80.0–100.0)
Monocytes Absolute: 0.8 10*3/uL (ref 0.1–1.0)
Monocytes Relative: 9 %
Neutro Abs: 7.4 10*3/uL (ref 1.7–7.7)
Neutrophils Relative %: 83 %
Platelet Count: 183 10*3/uL (ref 150–400)
RBC: 2.72 MIL/uL — ABNORMAL LOW (ref 4.22–5.81)
RDW: 15.6 % — ABNORMAL HIGH (ref 11.5–15.5)
WBC Count: 8.9 10*3/uL (ref 4.0–10.5)
nRBC: 0 % (ref 0.0–0.2)

## 2020-04-07 LAB — SAMPLE TO BLOOD BANK

## 2020-04-07 MED ORDER — SODIUM CHLORIDE 0.9% FLUSH
10.0000 mL | INTRAVENOUS | Status: DC | PRN
  Administered 2020-04-07: 11:00:00 10 mL
  Filled 2020-04-07: qty 10

## 2020-04-07 MED ORDER — HEPARIN SOD (PORK) LOCK FLUSH 100 UNIT/ML IV SOLN
500.0000 [IU] | Freq: Once | INTRAVENOUS | Status: AC | PRN
  Administered 2020-04-07: 11:00:00 500 [IU]
  Filled 2020-04-07: qty 5

## 2020-04-07 NOTE — Progress Notes (Signed)
[place] [phone]   [phone]  OFFICE PROGRESS NOTE  [name], MD [place] [address]  DIAGNOSIS: Limited stage small cell lung cancer.He presented with a large right lower lobe/right hilar mass with obstruction of the right lower lobe bronchus. The patient also has associated bulky right lower paratracheal and subcarinal adenopathy.He was diagnosed in July 2021.  PRIOR THERAPY: 1)weekly concurrent chemoradiation with carboplatin for an AUC of 2 and paclitaxel 45 mg/m.First dose 10/29/2019. Status post 1 cycle. Discontinued due to change in diagnosis. 2) Systemic chemotherapy with carboplatin for an AUC of 5 on day 1 and etoposide 100 mg per metered squared on days 1, 2, and 3 IV every 3 weeks with Neulasta support. First dose expected on 11/12/2019. Status post 6 cycles.  This was concurrent with radiation under the care of Dr. Lisbeth Renshaw for the obstructive lung mass.   Last dose of chemotherapy was given March 03, 2020  CURRENT THERAPY: Observation.  INTERVAL HISTORY: [name] 74 y.o. male returns to the clinic today for follow-up visit accompanied by his son.  The patient is feeling fine today with no concerning complaints except for fatigue and shortness of breath with exertion.  He denied having any chest pain, cough or hemoptysis.  He has no recent weight loss or night sweats.  He has no nausea, vomiting, diarrhea or constipation.  He denied having any headache or visual changes.  He was admitted to the hospital few weeks ago with uncontrolled atrial fibrillation with rapid ventricular response.  He was treated by cardiology.  He had persistent anemia and thrombocytopenia during his hospitalization.  He was also treated for multifactorial acute hypoxemic respiratory failure.  The patient is feeling much better today.  He had CT scan of the chest performed during his hospitalization.  He is here  today for evaluation and discussion of his scan results and recommendation regarding his condition.   MEDICAL HISTORY: Past Medical History:  Diagnosis Date  . Allergic rhinitis due to pollen   . Chronic kidney disease, stage III (moderate) ([place]) 12/19/2012  . Depression   . Diabetes mellitus ([place])   . Diverticulosis   . Erectile dysfunction associated with type 2 diabetes mellitus ([place]) 05/28/2015  . Former very heavy cigarette smoker (more than 40 per day) 10/07/2014  . GERD (gastroesophageal reflux disease)   . Gout   . Hyperlipidemia   . Hypertension   . Hypogonadism male 06/14/2012  . Memory loss   . Osteoarthritis   . Personal history of colonic polyps    1996  . Small cell carcinoma of right lung ([place]) 10/23/2019    ALLERGIES:  is allergic to tetracycline.  MEDICATIONS:  Current Outpatient Medications  Medication Sig Dispense Refill  . acetaminophen (TYLENOL) 650 MG CR tablet Take 650 mg by mouth every 8 (eight) hours as needed for pain.    [name] Kitchen albuterol (VENTOLIN HFA) 108 (90 Base) MCG/ACT inhaler Inhale 1-2 puffs into the lungs every 6 (six) hours as needed for wheezing or shortness of breath. 8 g 2  . Ascorbic Acid (VITAMIN C) 1000 MG tablet Take 1,000 mg by mouth daily.    . cholecalciferol (VITAMIN D) 1000 UNITS tablet Take 2,000 Units by mouth daily.    [name] Kitchen diltiazem (CARDIZEM CD) 240 MG 24 hr capsule Take 1 capsule (240 mg total) by mouth daily. 30 capsule 2  . donepezil (ARICEPT) 10 MG tablet TAKE 1 TABLET BY MOUTH  AT BEDTIME (Patient taking differently: Take 10 mg by mouth at bedtime.) 90 tablet 3  . fluticasone (FLONASE) 50 MCG/ACT nasal spray PLACE 2 SPRAYS IN EACH NOSTRIL DAILY (Patient taking differently: Place 1 spray into both nostrils daily.) 48 mL 3  . guaiFENesin (MUCINEX) 600 MG 12 hr tablet Take 600 mg by mouth daily as needed for cough.    . Hyprom-Naphaz-Polysorb-Zn Sulf (CLEAR EYES COMPLETE OP) Place 1 drop into both eyes daily as needed (dry eyes).    .  Insulin Pen Needle (PEN NEEDLES) 31G X 5 MM MISC 1 each by Does not apply route 3 (three) times daily. To inject insulin E11.42 90 each 2  . insulin regular human CONCENTRATED (HUMULIN R U-500 KWIKPEN) 500 UNIT/ML kwikpen Inject 40 Units into the skin with breakfast, with lunch, and with evening meal.    . memantine (NAMENDA) 10 MG tablet TAKE 1 TABLET BY MOUTH TWICE A DAY (Patient taking differently: Take 10 mg by mouth 2 (two) times daily.) 180 tablet 3  . montelukast (SINGULAIR) 10 MG tablet Take 1 tablet (10 mg total) by mouth at bedtime. 30 tablet 3  . pantoprazole (PROTONIX) 20 MG tablet Take 1 tablet (20 mg total) by mouth daily. 30 tablet 2  . prochlorperazine (COMPAZINE) 10 MG tablet Take 1 tablet (10 mg total) by mouth every 6 (six) hours as needed. (Patient taking differently: Take 10 mg by mouth every 6 (six) hours as needed for nausea or vomiting.) 30 tablet 2  . Semaglutide (OZEMPIC, 0.25 OR 0.5 MG/DOSE, [place]) Inject 0.25 mg into the skin once a week. Sundays    . simvastatin (ZOCOR) 40 MG tablet TAKE 1 TABLET BY MOUTH AT BEDTIME (Patient taking differently: Take 40 mg by mouth daily at 6 PM.) 90 tablet 2  . venlafaxine XR (EFFEXOR-XR) 75 MG 24 hr capsule TAKE 1 CAPSULE BY MOUTH EVERY MORNING AND 2 CAPSULES AT BEDTIME (Patient taking differently: Take 75 mg by mouth See admin instructions. Takes 1 capsule in the morning and 2 capsules at night.) 270 capsule 1   No current facility-administered medications for this visit.    SURGICAL HISTORY:  Past Surgical History:  Procedure Laterality Date  . BRONCHIAL NEEDLE ASPIRATION BIOPSY  10/17/2019   Procedure: BRONCHIAL NEEDLE ASPIRATION BIOPSIES;  Surgeon: Juanito Doom, MD;  Location: [facility];  Service: Cardiopulmonary;;  . CARDIAC CATHETERIZATION  11/2011   [facility]  . CHOLECYSTECTOMY  1980  . ENDOBRONCHIAL ULTRASOUND Bilateral 10/17/2019   Procedure: ENDOBRONCHIAL ULTRASOUND;  Surgeon: Juanito Doom, MD;  Location: [facility];   Service: Cardiopulmonary;  Laterality: Bilateral;  . ESOPHAGOGASTRODUODENOSCOPY (EGD) WITH PROPOFOL N/A 09/27/2017   Procedure: ESOPHAGOGASTRODUODENOSCOPY (EGD) WITH PROPOFOL;  Surgeon: Lin Landsman, MD;  Location: [facility];  Service: Gastroenterology;  Laterality: N/A;  . EYE SURGERY Right 07/29/2016   [facility] - Dr. Manuella Ghazi  . EYE SURGERY Left 08/23/2106   [facility] - Dr. Manuella Ghazi  . HEMOSTASIS CONTROL  10/17/2019   Procedure: HEMOSTASIS CONTROL;  Surgeon: Juanito Doom, MD;  Location: [facility];  Service: Cardiopulmonary;;  . IR IMAGING GUIDED PORT INSERTION  11/30/2019  . IRRIGATION AND DEBRIDEMENT ABSCESS Left 11/23/2019   Procedure: IRRIGATION AND DEBRIDEMENT ABSCESS;  Surgeon: Melina Schools, MD;  Location: [facility];  Service: Orthopedics;  Laterality: Left;  . TOTAL HIP ARTHROPLASTY  2009   Dr. Gladstone Lighter  . TOTAL HIP ARTHROPLASTY      REVIEW OF SYSTEMS:  Constitutional: positive for fatigue Eyes: negative Ears, nose, mouth, throat, and face:  negative Respiratory: positive for cough and dyspnea on exertion Cardiovascular: negative Gastrointestinal: negative Genitourinary:negative Integument/breast: negative Hematologic/lymphatic: negative Musculoskeletal:negative Neurological: negative Behavioral/Psych: negative Endocrine: negative Allergic/Immunologic: negative   PHYSICAL EXAMINATION: General appearance: alert, cooperative, fatigued and no distress Head: Normocephalic, without obvious abnormality, atraumatic Neck: no adenopathy, no JVD, supple, symmetrical, trachea midline and thyroid not enlarged, symmetric, no tenderness/mass/nodules Lymph nodes: Cervical, supraclavicular, and axillary nodes normal. Resp: clear to auscultation bilaterally Back: symmetric, no curvature. ROM normal. No CVA tenderness. Cardio: regular rate and rhythm, S1, S2 normal, no murmur, click, rub or gallop GI: soft, non-tender; bowel sounds normal; no masses,  no organomegaly Extremities:  extremities normal, atraumatic, no cyanosis or edema Neurologic: Alert and oriented X 3, normal strength and tone. Normal symmetric reflexes. Normal coordination and gait  ECOG PERFORMANCE STATUS: 1 - Symptomatic but completely ambulatory  Blood pressure 110/68, pulse 79, temperature (!) 97 F (36.1 C), temperature source Tympanic, resp. rate 18, height 5\' 11"  (1.803 m), weight 179 lb 8 oz (81.4 kg), SpO2 97 %.  LABORATORY DATA: Lab Results  Component Value Date   WBC 8.9 04/07/2020   HGB 8.1 (L) 04/07/2020   HCT 24.6 (L) 04/07/2020   MCV 90.4 04/07/2020   PLT 183 04/07/2020      Chemistry      Component Value Date/Time   NA 134 (L) 04/07/2020 1041   NA 130 (L) 11/25/2011 1119   NA 137 11/16/2011 0513   K 4.0 04/07/2020 1041   K 4.1 11/16/2011 0513   CL 95 (L) 04/07/2020 1041   CL 100 11/16/2011 0513   CO2 31 04/07/2020 1041   CO2 29 11/16/2011 0513   BUN 19 04/07/2020 1041   BUN 25 11/25/2011 1119   BUN 15 11/16/2011 0513   CREATININE 1.48 (H) 04/07/2020 1041   CREATININE 1.26 11/16/2011 0513      Component Value Date/Time   CALCIUM 8.7 (L) 04/07/2020 1041   CALCIUM 9.1 11/16/2011 0513   ALKPHOS 109 04/07/2020 1041   AST 13 (L) 04/07/2020 1041   ALT 16 04/07/2020 1041   BILITOT 0.5 04/07/2020 1041       RADIOGRAPHIC STUDIES: CT Chest W Contrast  Result Date: 03/21/2020 CLINICAL DATA:  Primary Cancer Type: Lung Imaging Indication: Assess response to therapy Interval therapy since last imaging? Yes Initial Cancer Diagnosis Date: 10/17/2019; Established by: Biopsy-proven Detailed Pathology: Limited stage small cell lung cancer. Primary Tumor location:  Right lower lobe/right hilum. Surgeries: Cardiac catheterization. Cholecystectomy. Chemotherapy: Yes; Ongoing? Yes; Most recent administration: 03/03/2020 Immunotherapy? No Radiation therapy? Yes; Date Range: 10/18/2019 - 11/28/2019; Target: Right lung EXAM: CT CHEST WITH CONTRAST TECHNIQUE: Multidetector CT imaging of  the chest was performed during intravenous contrast administration. CONTRAST:  61mL OMNIPAQUE IOHEXOL 300 MG/ML  SOLN COMPARISON:  Most recent CT chest 02/07/2020.  11/01/2019 PET-CT. FINDINGS: Cardiovascular: Normal heart size. No significant pericardial effusion/thickening. Three-vessel coronary atherosclerosis. Atherosclerotic nonaneurysmal thoracic aorta. Normal caliber pulmonary arteries. No central pulmonary emboli. Right internal jugular Port-A-Cath terminates at the cavoatrial junction. Mediastinum/Nodes: Hypodense 1.2 cm right thyroid nodule, stable. Not clinically significant; no follow-up imaging recommended (ref: J Am Coll Radiol. 2015 Feb;12(2): 143-50). Unremarkable esophagus. No axillary adenopathy. Enlarged 2.1 cm short axis diameter right paratracheal node (series 2/image 51), previously 2.1 cm, stable. Enlarged 1.1 cm right subcarinal node (series 2/image 71), previously 1.4 cm, slightly decreased. No new pathologically enlarged mediastinal nodes. No left hilar adenopathy. Lungs/Pleura: No pneumothorax. Small to moderate right pleural effusion, new. Small dependent left pleural effusion, new.  Poorly marginated masslike posterior right perihilar consolidation measuring 6.1 x 5.7 cm (series 2/image 61), increased from 4.7 x 3.4 cm. Extensive surrounding patchy consolidation, ground-glass opacity, septal thickening throughout the right lung, with a sharp inferior margin, substantially increased, compatible with evolving postradiation change. No new significant pulmonary nodules. Upper abdomen: Cholecystectomy. Musculoskeletal: No aggressive appearing focal osseous lesions. Moderate thoracic spondylosis. IMPRESSION: 1. Poorly marginated masslike posterior right perihilar lung consolidation has increased. Given the extensive increased patchy consolidation and ground-glass opacity in the surrounding right lung, these findings may all represent evolving post radiation change, although progression of the  perihilar right lung malignancy is difficult to exclude. PET-CT suggested for further characterization at this time. 2. Right paratracheal and subcarinal lymphadenopathy is stable to decreased. 3. New small to moderate right and small dependent left pleural effusions. 4. Aortic Atherosclerosis (ICD10-I70.0). Electronically Signed   By: Ilona Sorrel M.D.   On: 03/21/2020 11:19   CT Angio Chest PE W and/or Wo Contrast  Result Date: 03/24/2020 CLINICAL DATA:  Tachycardia for 3 weeks, shortness of breath, intermittent chest pain, question pulmonary embolism; history of lung cancer post chemotherapy and radiation therapy, history hypertension, type II diabetes mellitus, stage III chronic kidney disease, former smoker EXAM: CT ANGIOGRAPHY CHEST WITH CONTRAST TECHNIQUE: Multidetector CT imaging of the chest was performed using the standard protocol during bolus administration of intravenous contrast. Multiplanar CT image reconstructions and MIPs were obtained to evaluate the vascular anatomy. CONTRAST:  20mL OMNIPAQUE IOHEXOL 350 MG/ML SOLN IV COMPARISON:  CT chest 03/21/2020 FINDINGS: Cardiovascular: Atherosclerotic calcifications of aorta, proximal great vessels and coronary arteries. Aorta normal caliber without aneurysm or dissection. RIGHT jugular Port-A-Cath with tip in RIGHT atrium. Cardiac chambers unremarkable. No pericardial effusion. Pulmonary arteries adequately opacified. Few scattered respiratory motion artifacts are present, notably LEFT lower lobe. No definite pulmonary emboli identified. Mediastinum/Nodes: 12 mm nodule RIGHT thyroid lobe; not clinically significant; no follow-up imaging recommended (ref: J Am Coll Radiol. 2015 Feb;12(2): 143-50).2.2 cm RIGHT paratracheal node image 51 unchanged. Additional scattered normal size mediastinal nodes. Previously measured subcarinal node less well delineated due to minimal infiltration. Esophagus unremarkable. Lungs/Pleura: BILATERAL pleural effusions  increased from previous exam, partially loculated on RIGHT. Cavitary lesion and posterior RIGHT upper lobe again seen. Perihilar consolidation again seen. Narrowing of bronchus intermedius and proximal RIGHT middle lobe/RIGHT lower lobe bronchi. Narrowing of RIGHT upper lobe bronchus. Extensive infiltrate RIGHT upper lobe and superior segment RIGHT lower lobe unchanged likely due to radiation. Scattered areas of atelectasis little changed. Remaining LEFT lung clear. No pneumothorax. Upper Abdomen: Prior cholecystectomy. No acute upper abdominal abnormalities. Musculoskeletal: No definite osseous lesions. Scattered degenerative disc disease changes thoracic spine. Review of the MIP images confirms the above findings. IMPRESSION: No definite evidence of pulmonary embolism. Stable RIGHT paratracheal adenopathy. Increased BILATERAL pleural effusions with compressive atelectasis of the adjacent lower lobes. Persistent perihilar consolidation RIGHT lung with narrowing of the origins of the lobar bronchi. Cavitary focus RIGHT upper lobe unchanged. Extensive infiltrates throughout RIGHT upper lobe and at superior segment of RIGHT lower lobe question related to radiation therapy. Aortic Atherosclerosis (ICD10-I70.0). Electronically Signed   By: Lavonia Dana M.D.   On: 03/24/2020 16:27   DG CHEST PORT 1 VIEW  Result Date: 03/26/2020 CLINICAL DATA:  Cough.  Right-sided lung carcinoma EXAM: PORTABLE CHEST 1 VIEW COMPARISON:  March 24, 2020 chest radiograph and chest CT FINDINGS: Port-A-Cath tip is in the superior vena cava. No pneumothorax. Widespread airspace opacity throughout much of the right lung with  right pleural effusion remains. The area of cavitation seen previously in the right upper lobe by CT is less well seen by radiography. Left lung is now clear. Heart size is normal. Pulmonary vascularity on the left is normal. Pulmonary vascularity on the right is somewhat distorted due to consolidation. Adenopathy on  the right seen on recent CT is less well seen by radiography. No bone lesions appreciable. IMPRESSION: Widespread opacification throughout much of the right lung with right pleural effusion again noted, similar to recent studies. Area of cavitation in the right upper lobe better seen by CT. Appearance by radiography is consistent with extensive pneumonitis, although underlying mass may well be present and obscured by pneumonitis. Left lung now clear. Stable cardiac silhouette. Adenopathy seen on CT not well seen by radiography. Electronically Signed   By: Lowella Grip III M.D.   On: 03/26/2020 13:56   DG Chest Portable 1 View  Result Date: 03/24/2020 CLINICAL DATA:  Shortness of breath.  History of lung cancer. EXAM: PORTABLE CHEST 1 VIEW COMPARISON:  November 26, 2019. FINDINGS: The heart size and mediastinal contours are within normal limits. Left lung is clear. Increased right upper lobe and right perihilar opacity is noted concerning for pneumonia. Increased right basilar atelectasis or infiltrate is noted with associated pleural effusion. No pneumothorax is noted. Interval placement of right internal jugular Port-A-Cath with distal tip in expected position of the SVC. The visualized skeletal structures are unremarkable. IMPRESSION: Increased right upper lobe and right perihilar opacity is noted concerning for pneumonia. Increased right basilar atelectasis or infiltrate is noted with associated pleural effusion. Electronically Signed   By: Marijo Conception M.D.   On: 03/24/2020 14:52   ECHOCARDIOGRAM COMPLETE  Result Date: 03/12/2020    ECHOCARDIOGRAM REPORT   Patient Name:   [name] Date of Exam: 03/12/2020 Medical Rec #:  [id number]       Height:       71.0 in Accession #:    [id number]      Weight:       182.0 lb Date of Birth:  1945-06-29       BSA:          2.026 m Patient Age:    4 years        BP:           127/71 mmHg Patient Gender: M               HR:           98 bpm. Exam Location:   Inpatient Procedure: 2D Echo, 3D Echo, Color Doppler, Cardiac Doppler and Intracardiac            Opacification Agent Indications:    Atrial Flutter 427.32 / I48.92  History:        Patient has prior history of Echocardiogram examinations, most                 recent 12/24/2014. Risk Factors:Hypertension, Diabetes and                 Dyslipidemia. Small cell carcinoma of right lung. Chronic kidney                 disease, stage III.  Sonographer:    Darlina Sicilian RDCS Referring Phys: [phone] JARED M GARDNER  Sonographer Comments: No parasternal window and suboptimal apical window. IMPRESSIONS  1. Left ventricular ejection fraction, by estimation, is 65 to 70%. The left ventricle has hyperdynamic function. The  left ventricle has no regional wall motion abnormalities. There is mild concentric left ventricular hypertrophy. Left ventricular diastolic parameters are indeterminate.  2. Right ventricular systolic function is normal. The right ventricular size is normal. Mildly increased right ventricular wall thickness.  3. The mitral valve is grossly normal. No evidence of mitral valve regurgitation.  4. The aortic valve is tricuspid. There is mild calcification of the aortic valve. Aortic valve regurgitation is not visualized.  5. The inferior vena cava is normal in size with greater than 50% respiratory variability, suggesting right atrial pressure of 3 mmHg. Comparison(s): A prior study was performed on 12/24/2014. No significant change from prior study. FINDINGS  Left Ventricle: Left ventricular ejection fraction, by estimation, is 65 to 70%. The left ventricle has hyperdynamic function. The left ventricle has no regional wall motion abnormalities. Definity contrast agent was given IV to delineate the left ventricular endocardial borders. The left ventricular internal cavity size was small. There is mild concentric left ventricular hypertrophy. Left ventricular diastolic parameters are indeterminate. Right Ventricle: The  right ventricular size is normal. Mildly increased right ventricular wall thickness. Right ventricular systolic function is normal. Left Atrium: Left atrial size was normal in size. Right Atrium: Right atrial size was normal in size. Pericardium: Trivial pericardial effusion is present. Mitral Valve: The mitral valve is grossly normal. No evidence of mitral valve regurgitation. Tricuspid Valve: The tricuspid valve is normal in structure. Tricuspid valve regurgitation is not demonstrated. Aortic Valve: The aortic valve is tricuspid. There is mild calcification of the aortic valve. Aortic valve regurgitation is not visualized. Pulmonic Valve: The pulmonic valve was not well visualized. Pulmonic valve regurgitation is not visualized. Aorta: The aortic root is normal in size and structure. Venous: The pulmonary veins were not well visualized. The inferior vena cava is normal in size with greater than 50% respiratory variability, suggesting right atrial pressure of 3 mmHg. IAS/Shunts: The atrial septum is grossly normal.  LEFT VENTRICLE PLAX 2D LVIDd:         3.60 cm  Diastology LVIDs:         2.60 cm  LV e' medial:    10.80 cm/s LV PW:         1.10 cm  LV E/e' medial:  10.9 LV IVS:        1.10 cm  LV e' lateral:   18.30 cm/s LVOT diam:     2.10 cm  LV E/e' lateral: 6.4 LV SV:         47 LV SV Index:   23 LVOT Area:     3.46 cm                         3D Volume EF                         LV 3D EF:    71.20 %                         LV 3D EDV:   85200.00 mm                         LV 3D ESV:   24600.00 mm                         LV 3D SV:    60700.00 mm RIGHT VENTRICLE  RV S prime:     16.80 cm/s TAPSE (M-mode): 1.4 cm LEFT ATRIUM             Index       RIGHT ATRIUM           Index LA diam:        3.30 cm 1.63 cm/m  RA Area:     11.80 cm LA Vol (A2C):   32.2 ml 15.90 ml/m RA Volume:   21.90 ml  10.81 ml/m LA Vol (A4C):   32.8 ml 16.19 ml/m LA Biplane Vol: 34.0 ml 16.78 ml/m  AORTIC VALVE LVOT Vmax:   93.50 cm/s  LVOT Vmean:  66.500 cm/s LVOT VTI:    0.136 m  AORTA Ao Root diam: 2.80 cm MITRAL VALVE MV Area (PHT): 4.80 cm     SHUNTS MV Decel Time: 158 msec     Systemic VTI:  0.14 m MV E velocity: 118.00 cm/s  Systemic Diam: 2.10 cm MV A velocity: 46.30 cm/s MV E/A ratio:  2.55 Rudean Haskell MD Electronically signed by Rudean Haskell MD Signature Date/Time: 03/12/2020/3:52:17 PM    Final     ASSESSMENT AND PLAN: This is a very pleasant 74 years old white male recently diagnosed with limited stage small cell lung cancer presented with large right lower lobe/right hilar mass with obstruction of the right lower lobe bronchus and bulky right lower lobe paratracheal and subcarinal adenopathy diagnosed in July 2021. The patient completed a course of systemic chemotherapy with carboplatin and etoposide status post 6 cycles.  This was concurrent with radiotherapy under the care of Dr. Lisbeth Renshaw. The patient tolerated the previous course of his treatment well except for the fatigue from the chemotherapy-induced anemia. His last CT scan of the chest on March 24, 2020 showed no concerning findings for disease progression but there was more extensive infiltration in the right upper lobe and superior segment of the lower lobe related to radiotherapy as well as increased bilateral pleural effusions at likely related to his heart disease. I discussed the scan results with the patient and his son. I recommended for him to continue on observation with repeat CT scan of the chest in 3 months. I also recommended for the patient to see Dr. Lisbeth Renshaw for evaluation and discussion of the prophylactic cranial irradiation. For the persistent anemia, I recommended for the patient to start taking oral iron tablet at regular basis. For the history of kidney stone, I gave the patient the option of referral to urology but he would like to wait on this option for now and he will continue drinking plenty of fluid. He was advised to call  immediately if he has any other concerning symptoms in the interval. The patient voices understanding of current disease status and treatment options and is in agreement with the current care plan.  All questions were answered. The patient knows to call the clinic with any problems, questions or concerns. We can certainly see the patient much sooner if necessary.  Disclaimer: This note was dictated with voice recognition software. Similar sounding words can inadvertently be transcribed and may not be corrected upon review.

## 2020-04-07 NOTE — Telephone Encounter (Signed)
Dtr is on the way to stay with pt and found out she was exposed to [place] Saturday.  Pt advised to not be around dtr until she is tested  for covid .

## 2020-04-07 NOTE — Telephone Encounter (Signed)
Scheduled appt per 12/27 los - mailed reminder with appt date and time

## 2020-04-08 NOTE — Progress Notes (Signed)
LENORRIS, KARGER ([id number]) Visit Report for 04/04/2020 Chief Complaint Document Details Patient Name: [name], [name] Date of Service: 04/04/2020 11:30 AM Medical Record Number: [id number] Patient Account Number: [id number] Date of Birth/Sex: 09-30-1945 (74 y.o. M) Treating RN: Cornell Barman Primary Care Provider: [name] Other Clinician: Referring Provider: [name] Treating Provider/Extender: Skipper Cliche in Treatment: 17 Information Obtained from: Patient Chief Complaint Left plantar foot ulcer s/p surgical debridement and wound VAC placement. Left great toe ulcer. Electronic Signature(s) Signed: 04/04/2020 11:28:16 AM By: Worthy Keeler [name] Entered By: Worthy Keeler on 04/04/2020 11:28:16 [name] ([id number]) -------------------------------------------------------------------------------- Debridement Details Patient Name: [name] Date of Service: 04/04/2020 11:30 AM Medical Record Number: [id number] Patient Account Number: [id number] Date of Birth/Sex: 26-May-1945 (74 y.o. M) Treating RN: Cornell Barman Primary Care Provider: [name] Other Clinician: Referring Provider: [name] Treating Provider/Extender: Skipper Cliche in Treatment: 17 Debridement Performed for Wound #7 Left,Lateral,Plantar Foot Assessment: Performed By: Physician Tommie Sams., [name] Debridement Type: Debridement Severity of Tissue Pre Debridement: Fat layer exposed Level of Consciousness (Pre- Awake and Alert procedure): Pre-procedure Verification/Time Out Yes - 11:44 Taken: Total Area Debrided (L x W): 0.2 (cm) x 1 (cm) = 0.2 (cm) Tissue and other material Viable, Non-Viable, Slough, Subcutaneous, Slough debrided: Level: Skin/Subcutaneous Tissue Debridement Description: Excisional Instrument: Curette Bleeding: Minimum Hemostasis Achieved: Pressure Response to Treatment: Procedure was tolerated well Level of Consciousness  (Post- Awake and Alert procedure): Post Debridement Measurements of Total Wound Length: (cm) 0.3 Width: (cm) 1 Depth: (cm) 0.3 Volume: (cm) 0.071 Character of Wound/Ulcer Post Debridement: Stable Severity of Tissue Post Debridement: Limited to breakdown of skin Post Procedure Diagnosis Same as Pre-procedure Electronic Signature(s) Signed: 04/04/2020 1:58:59 PM By: Worthy Keeler [name] Signed: 04/08/2020 3:14:13 PM By: Gretta Cool, BSN, RN, CWS, Kim RN, BSN Entered By: Gretta Cool, BSN, RN, CWS, Kim on 04/04/2020 11:45:28 [name] ([id number]) -------------------------------------------------------------------------------- HPI Details Patient Name: [name] Date of Service: 04/04/2020 11:30 AM Medical Record Number: [id number] Patient Account Number: [id number] Date of Birth/Sex: [birth year] (74 y.o. M) Treating RN: Cornell Barman Primary Care Provider: [name] Other Clinician: Referring Provider: [name] Treating Provider/Extender: Skipper Cliche in Treatment: 17 History of Present Illness HPI Description: 10/04/16 on evaluation today patient presents for initial evaluation concerning a laceration of the right wrist and hand on the bowler aspect. He tells me that he fell into glass following a medication change in regard to his diabetes. He initially went to [facility] where she was sutured and subsequently his daughter who is a Marine scientist removed the teachers at the appropriate time. Unfortunately the wound has done poorly and dehissed which has caused him some trouble including infection. He has been on antibiotics both topically and orally though the wound continues to show signs of necrotic tissue according to notes which is what he was sent to [place] for evaluation concerning. He does have diabetes and unfortunately this is severely uncontrolled. He does see an endocrinologist and is working on this. 10/11/2016 -- the patient's notes from the ER were reviewed and  he had his suturing done on 09/17/2016 and had necrotic skin flaps which had to be trimmed the last visit he was here. He has managed to get his Santyl ointment and has been using this appropriately. 10/21/16 patient's wound appears to be doing very well on evaluation today. He still has some Slough covering the wound bed but this is nowhere near as severe as when i  saw this initially two weeks ago nor even my review of his pictures last week. I'm pleased with how this is progressing. Patient's wound over the right form appears to be doing fairly well on evaluation today. There is no evidence of infection and he has a small area at the crease of where she is wrist extends and flexes that is still open and appears to be having difficulty due to the fact that it is cracking open. I think this is something that may be controlled with moisture control that is keeping the wound a little bit more moist. Nonetheless this is a tiny region compared to the initial wound and overall appears to be doing very well. No fevers, chills, nausea, or vomiting noted at this time. 11/25/16 on evaluation today patient appears to be doing well in regard to his right wrist ulcer. In fact this appears to be completely healed he is having no discomfort. He is pleased with how this is progressed. Readmission: 06/24/17 on evaluation today patient appears back in our clinic regarding issues that he has been having with his right ankle on the lateral malleolus as well is in the dorsal surface of his left foot. Both have been going on for several weeks unfortunately. He tells me that initially this was being managed by Dr. Edilia Bo and I did review the notes from Dr. Edilia Bo in epic. Patient was placed on amoxicillin initially and then subsequently Keflex following. He has been using triple antibiotic ointment over-the-counter along with a Band-Aid for the wound currently. He did not have any Santyl remaining. His most recent  hemoglobin A1c which was just two days ago was 10.3. Fortunately he seems to have excellent blood flow in the bilateral lower extremities with his left ABI being 0.95 in the right ABI 1.01. Patient does have some pain in regard to each of these ulcerations but he tells me it's not nearly as severe as the hand wound that I help treat earlier over the summer. This was in 2018. 07/15/17 on evaluation today patient's lower surety ulcer is actually both appear to be doing fairly well at this point. He does have a little bit of skin overlapping the ulcer on the right lateral ankle this was debrided away today just with saline and gauze without complication patient had no discomfort or pain. Otherwise patient's left foot ulcer does appear to be showing signs of improvement he does seem to have a right great toe. Nikki and noted at this point in time. I think that this does need to be managed at this point. No fevers, chills, nausea, or vomiting noted at this time. 07/22/17 on evaluation today patient's wounds in regard to his lower extremities appear to be doing somewhat better. His right ankle wound in fact appears to likely be completely closed. The left dorsal foot ulcer though still open does appear to be a little bit smaller and does also seem to be making good progress. He has not been having problems with the treatment at this point in the general he does not seem to show as much erythema surrounding the wound bed which is good news. He is not having significant discomfort. 08/12/17 on evaluation today patients wounds of both appeared to be doing better in fact they both appear to be completely healed. Overall I'm very pleased with how things have progressed over the past week even more than what I expected. Obviously this is good news and he is happy about this. He did have  a fall when going to his orthopedic doctor recently fortunately he does not appear to have injured anything severely but nonetheless  does have a couple of bumps and bruises nothing significant at this time but he needs to be seen and wound care for. He may have broken his left wrist he is following up with orthopedics in that regard. Readmission: 10/24/17 on evaluation today patient presents for reevaluation after having been discharged Aug 12, 2017. His wound was healed at that point although he states that it has been somewhat discolored since that time. He has not experienced the drainage as far as the wound is concerned but the fact that it was still discolored been greater than two months after the fact made him want to come in for reevaluation. Upon inspection today the patient has no pain although he does have neuropathy. He does have a discolored region of the dorsal surface of the left foot which was the site where the ulcer was that we previously treated. The good news is he does not again have any discomfort although obviously I do believe this may be some scar tissue and just discoloration in that regard. Again he hasn't had any drainage or discharge since I last saw him and at this point I see no evidence of that either this area shows complete epithelialization. In general I do not feel like there's any significant issue at this point in that regard. 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 He has been in this clinic before in 2018 with a laceration on his right hand and then again in 2019 with an area over the right lateral malleolus. 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 He states that the wound is been there for the last week or [name], [name]. ([id number]) 2. It is been uncomfortable to walk on. The man is an active man having to look after an elderly mother. Past medical history, type 2 diabetes with polyneuropathy with a recent hemoglobin  A1c in September 2011 0.3, lumbar disc disease, hypergonadism., Left foot drop and gout. We did not repeat his ABIs in the clinic quoting values from July 2019 of 0.95 and 1.01. He is not felt to have significant PAD 02/19/2019 on evaluation today patient actually appears to be doing better with regard to his foot wound based on what I am seeing currently. He was seen by Dr. Dellia Nims on readmission back into the clinic last week when I was off. The good news is the patient has been tolerating the dressing changes without any complication in fact has not really had much in the way of drainage. He does have an AFO brace which she believes is actually pushing on this area as well and has contributed to the issue. Fortunately there is no signs of active infection at this point. His ABIs appear to be well. 03/05/2019 on evaluation today patient appears to be doing well in regard to his foot ulcer. He has been tolerating the dressing changes without complication. Fortunately there is no signs of active infection. The wound is measuring somewhat smaller and overall I feel like he is making good progress at this point. 03/12/2019 on evaluation today patient actually appears to be doing quite well with regard to his foot ulcer. The wound is looking better the measurement is not significantly better at this  point unfortunately but nonetheless again the overall appearance is improving I think he is making some progress. I still think the big issue here is foot drop and again the patient wants to see if potentially he could get a brace to help with this. Subsequently I think that we can definitely make a referral to [facility] to see if they can help [place] in this regard. He is in agreement with that plan. 03/26/2019 on evaluation today patient appears to be doing more poorly in regard to his foot ulcer at this time. He did see podiatry in order to see about getting a brace but unfortunately he tells me that Medicare  is not likely to pay for one for him as he had 1 I guess 8 months ago. However this one that apparently goes in his shoes that does not seem to be working out well for him. Unfortunately there are signs of active infection at this time. No fevers, chills, nausea, vomiting, or diarrhea. Systemically. The patient has erythema on the dorsal surface of his foot that tracks from the lateral portion of his foot and again he does have a blister around the wound as well which is worse compared to previous. Overall I am concerned. 04/02/2019 on evaluation today patient appears to be doing about the same with regard to his wound. Unfortunately there is no signs of significant improvement although I do think there is no signs of infection which is good news. His drainage is also slowed down tremendously. I think at this point he would benefit from the initiation of a total contact cast. The only concern I have is that over his balance. I explained to the patient that if we were to initiate the cast I think it would be greatly beneficial for him but he would need to ensure not to walk at all unless he uses a walker he actually has 2 walkers one for inside his home and one that he keeps out in the car. For that reason he would be covered in that regard. I just do not want him to have any accidents and fall with the cast. 12/28-Patient comes in 1 week out a day early for his appointment he was in the hospital for 2 days for uncontrolled hyperglycemia and required management of high blood sugars, with some adjustments in his home regimen of insulin, patient stubbed his right great toe today and put a Band-Aid on it, he is here for his left lateral foot ulcer and we are using silver alginate dressing, he was intact total contact cast with difficulty with balance to the extent that that had to be discontinued 04/16/2019 on evaluation today patient appears to be doing better with regard to the overall appearance of his  wound size. This is good news. We had attempted a total contact cast but unfortunately he had some falling issues that the family contacted [place] about we took the cast off we did not put it back on. Nonetheless it ended up that just a couple days later he was in the hospital due to elevations in his blood sugar which went to around 500 and even a little higher. Subsequently he was admitted to the hospital and was in the hospital for a total of 3 days and that included Christmas day. Fortunately there got his blood sugar under control he tells me that this is running about 200 now which is much better news. There does not appear to be any signs of active infection  at this time. No fevers, chills, nausea, vomiting, or diarrhea. 04/30/2019 on evaluation today patient actually appears to be doing slightly worse compared to previous as far as the overall size of his wound is concerned. There also is some need for sharp debridement today as well. Fortunately there is no evidence of active infection which is good news. No fevers, chills, nausea, vomiting, or diarrhea. 05/07/2019 upon evaluation today patient appears to be doing excellent in regard to his plantar foot ulcer. He has been tolerating the dressing changes without complication. Fortunately there is no signs of active infection at this time. No fevers, chills, nausea, vomiting, or diarrhea. He does tell me that he actually got in touch with [employer] and they feel like they can add diagnosis codes to his orders in order to get an external brace for him as opposed to the one that goes internal to issue. That would be excellent for his foot drop and I did tell him that if there is anything I need to fill out in that regard I will be more than happy to oblige and fill out what is needed for [employer]. 05/14/2019 upon evaluation today patient's original wound site actually appears to be doing much better which is good news. Fortunately there is no signs of  active infection at this time. No fevers, chills, nausea, vomiting, or diarrhea. With that being said the patient unfortunately is continuing to have issues with rubbing he tells me that the felt slid on his foot although he did not realize it until today. Nonetheless I think this did cause a blister around the edge of the wound and he does an open wound that is slightly larger overall in measurement compared to last time mainly due to this blister. Other than that I feel like he is doing quite well. 05/21/2019 upon evaluation today patient appears to be doing excellent in regard to his wound at this time on the plantar foot. He is showing signs of improvement he does have some slough and biofilm noted on the surface of the wound this is going require some sharp debridement today as well. Fortunately there is no evidence of active infection. No fevers, chills, nausea, vomiting, or diarrhea. 05/28/2019 on evaluation today patient's wound actually appears to be measuring smaller upon initial inspection and I do believe that it is doing well. With that being said he continues to have a blister on the outside/lateral edge of the wound that occurs as a result of rubbing due to his dropfoot currently. Subsequently we have filled out the paperwork for his new external brace but again that does take several weeks to get that completed once everything is returned to back we did send this back last week as far as the paperwork was concerned. With that being said there does not appear to be any signs of active infection at this time. 06/04/2019 on evaluation today patient's wound actually appears to be doing slightly better compared to last week which is good news. He still states that his insurance unfortunately does not seem to want to cover any of his new brace. He is going to just pay for this himself. Nonetheless he is going by there today when he leaves here that is [employer] in [place] that is going to be  making this brace for him. 06/18/2019 upon evaluation today patient fortunately does have his new brace which is the external AFO brace with bilateral uprights. Subsequently he seems to be doing much better in fact the  wound already appears better. He has not even been using the felt he is just using a dressing over [place] ([id number]) top of the wound and overall I am very pleased with how things seem to be progressing. There is no signs of active infection at this time which is great news and I think that the AFO is good to help him tremendously as far as trying to keep pressure off of the 06/25/2019 upon evaluation today patient seems to be making good progress in regard to his wound. He has been tolerating the dressing changes without complication. Fortunately there is no signs of active infection at this time. No fevers, chills, nausea, vomiting, or diarrhea. 07/02/2019 upon evaluation today patient unfortunately had a blistered area on the bottom of his foot tracking more towards the midline of the plantar aspect. He does not seem to have issues with rubbing more laterally that he used to have but nonetheless this is still giving him some trouble and causing problems here. Fortunately there is no signs of active infection at this time. No fevers, chills, nausea, vomiting, or diarrhea. 07/09/2019 upon evaluation today patient appears to be doing well with regard to his plantar foot ulcer. Definitely this is much improved compared to last week's visit which again last week the issue there was that he had an area of callus buildup on the plantar aspect of his foot I was able to clean this away and after effectively doing so the wound seems to be doing much better which is great news. There is no signs of active infection at this time. 07/16/2019 upon evaluation today patient appears to be making some progress in my opinion with regard to his plantar foot ulcer. He has been tolerating the  dressing changes without complication. Fortunately there is no evidence of active infection at this time which is great news. 07/23/2019 upon evaluation today patient appears to be doing well with regard to his wounds on the plantar foot. He has been tolerating the dressing changes without complication. These do seem to be healing. 08/06/2019 upon evaluation today patient's wound actually appears to be showing some signs of improvement though he still has a significant amount of callus buildup over this location. That is can require some debridement today. With that being said the actual wound openings seem to be dramatically improved which is great news. There is no signs of active infection at this time. No fevers, chills, nausea, vomiting, or diarrhea. 08/13/2019 upon evaluation today patient appears to be doing well with regard to his foot ulcer. He has been tolerating the dressing changes without complication. Fortunately there is no signs of active infection at this time. No fevers, chills, nausea, vomiting, or diarrhea. 08/20/2019 upon evaluation today patient appears to be doing excellent in regard to his foot ulcer. I do believe he still showing signs of improvement this is very slow but again I think still pressure getting to the area even with his brace and custom shoe is still part of the main issue with the delay here. Nonetheless I do not see any signs of worsening or infection all of which is good news. 5/20; left lateral foot wound. He wears an AFO brace for foot drop related to his diabetes. The wound is small and clean looking he is using silver collagen. We have apparently tried him in offloading shoe he could not tolerate this. He does not have good balance. 09/11/2019 upon evaluation today patient appears to be doing more poorly  in regard to his foot ulcer as compared to last time I saw him. Unfortunately he does tell me as we discussed on Friday that he had pus and blood coming from the  area underneath the callus. Unfortunately I think that this is a combination of the callus not being trimmed away enough and potentially the felt pad causing an abnormal rubbing which has happened before which led to this becoming more irritated and likely infected based on what I am seeing today. The patient takes ampicillin all the time. Subsequently he also states that he has been trying for a couple of days to take the clindamycin of which she had some leftover from previous. With that being said it upset his stomach. The antibiotic that I sent in for him on Friday unfortunately I wrote the wrong number on as far as the number of pills that he needed. With that being said I corrected that this morning but he tells me is not can pick it up as that seems to be making him have trouble with his stomach being upset at this point. Fortunately there is no signs of active infection at this time systemically. 09/17/2019 upon evaluation today patient appears to be doing better in regard to his foot ulcer. Has been taking ampicillin that he had leftover from a previous prescription. With that being said he did go see his doctor today concerning his diabetes and notes that his hemoglobin A1c was 13 obviously this is elevated and there can be working on that. Also did review the culture result that we finally got back for final well that showed rare Pseudomonas and a few group B strep noted. The Pseudomonas was sensitive to Cipro but again the wound is doing so much better with the ampicillin I think this is probably not a causative organism I do not see anything that really has me concerned as far as infection is concerned today. Therefore I would hold off on prescribing Cipro for the patient and have him discontinue the ampicillin until completion. 09/24/2019 upon evaluation today patient appears to be doing 09/24/2019 upon evaluation today patient appears to be doing well with regard to his foot ulcer. He  actually has not enlarged any and seems to be measuring smaller quite significantly at this point. His toe ulcer is also doing somewhat better which is good news. Fortunately there is no evidence of active infection at this time. No fevers, chills, nausea, vomiting, or diarrhea. 10/08/2019 upon evaluation today patient appears to be doing well with regard to his foot ulcer all things considered. Fortunately there is no signs of active infection at this time. No fevers, chills, nausea, vomiting, or diarrhea. Fortunately he states that overall he has been doing much better with regard to not have any blistering on the side of his foot. Unfortunately he did have a fall last night due to the fact that he got up to walk without his brace on and did not have his walker either it was around 4 in the morning and his mother was calling out. Subsequently he states that the foot drop got him and he ended up following. Nonetheless he states he normally uses his walker I explained he needs to try to make sure not to ever go without the walker. No matter how attempting the situation may be. Readmission: 12/03/2019 upon evaluation today patient presents for follow-up after having had surgery last week on his foot for surgical debridement and then subsequent wound VAC placement. He had  gone into the hospital as he was having a significant infection and not feeling well. He is unfortunately since I last saw him also been under the care of the cancer doctors and they have been taking good care of him. He does seem to be in fairly high spirits all things considered. With that being said he has lung cancer he tells me that his back from the radiation feels like it is constantly burned. 12/10/2019 on evaluation today patient appears to be doing well with regard to his wound. I do believe the wound VAC is doing a great job at helping to manage his drainage and overall health and to granulate in the wound. He has less  undermining noted today in fact almost none. Home health is coming out to change the wound VAC on a regular basis Monday, Wednesday, and Friday. 12/24/2019 on evaluation today patient appears to be doing fairly well in regard to the wound on the plantar aspect of his foot. With that being said he does have a significant feeling in of what has been going on with regard to the ulceration which does not have the depth that it did in the past. With that being said I do believe that based on what I am seeing at this point the patient likely does need to take a break from the wound VAC and in fact it appears the wound VAC was actually placed directly on the skin as far as the bridging was concerned causing some irritation of the top of the patient's foot. This has me concerned as well that if were not careful that could worsen. I do believe a break with the wound VAC however will be fine and then we can see where we go from there. [name], [name] ([id number]) 01/07/2020 upon evaluation today patient actually appears to be doing much better in regard to his wound. He has been tolerating the dressing changes without complication. There is no signs of active infection and overall I feel like he is managing quite nicely. I do believe that we would likely discontinue the [facility] altogether today. 10/13; follow-up visit. Patient has a area on the left lateral plantar foot. He has been using Hydrofera Blue and bordered foam he has home health changing this 3 times a week. He is nonambulatory. He avoids pushing the wheelchair with that part of his foot and instead uses his heel. We have had a nice improvement today and overall surface area 10/27; 2-week follow-up. He has been using Hydrofera Blue and bordered foam on a wound on the left lateral foot. He told me he was nonambulatory last time from foot drop although he walked in the clinic with a walker this week apparently did not have any transportation. Also he has  a new wound on the right dorsal medial foot which she says was caused by foot where trauma 11/10; 2-week follow-up. We have been using Hydrofera Blue on the left lateral foot roughly at the base of the fifth metatarsal. He came in last visit with a punched out area on the right dorsal foot. He says this was foot where trauma. I reiterated that he is not wearing his previous braces and he is in. He spends most of his time in his wheelchair. He says he is careful not to be pushing himself around in the wheelchair with his feet. He is wearing regular tennis shoes. I taken a bit of time to review the history of the wound on the left foot.  This actually was a chronic wound for him. Episodic infections. He had arterial studies done in August these were normal. ABI in the right of 1.05, TBI at 0.65 with triphasic waveforms on the left 1.01 with a TBI of 0.78 and again triphasic waveforms. Does not appear to be a arterial issue 11/17 we brought the patient in today to look at the area on the right dorsal foot and the left lateral plantar foot. The left was the original wound. Patient states the right dorsal foot happened from trauma from his brace. He is no longer using this. He is mostly in surgical shoes that we provide except when he goes out driving. We changed him to silver collagen last week. He does not have an arterial issue He came to the clinic today got out of his car went to the back of his car felt presyncopal and fell down. When we got out there to help him up he was alert conversational. Blood pressure little reduced at 947 systolic: We got him in the exam room. Otherwise he looks fine 03/21/2020 upon evaluation today patient appears to be doing decently well in regard to his ulcers on the bilateral feet. Fortunately there is no signs of active infection at this time which is great news. No fever chills noted. He has been tolerating the dressing changes without complication. Fortunately there  is no sign of active infection at this time. No fevers, chills, nausea, vomiting, or diarrhea. 04/04/2020 upon evaluation today patient's wounds actually are showing signs of improvement at both locations. There is good be some sharp debridement necessary on the plantar foot but overall not much compared to where things have been in the past. Overall I feel like he is doing quite well Electronic Signature(s) Signed: 04/04/2020 12:48:40 PM By: Worthy Keeler [name] Entered By: Worthy Keeler on 04/04/2020 12:48:39 [name] ([id number]) -------------------------------------------------------------------------------- Physical Exam Details Patient Name: [name] Date of Service: 04/04/2020 11:30 AM Medical Record Number: [id number] Patient Account Number: [id number] Date of Birth/Sex: 11-25-1945 (74 y.o. M) Treating RN: Cornell Barman Primary Care Provider: [name] Other Clinician: Referring Provider: [name] Treating Provider/Extender: Skipper Cliche in Treatment: 92 Constitutional Well-nourished and well-hydrated in no acute distress. Respiratory normal breathing without difficulty. Psychiatric this patient is able to make decisions and demonstrates good insight into disease process. Alert and Oriented x 3. pleasant and cooperative. Notes Upon inspection patient's wound bed on the plantar foot did require sharp debridement clear away some of the necrotic debris I did perform this today without any pain or complication removing callus, slough, biofilm, all down to good subcutaneous tissue with minimal bleeding. He tolerated this today without complication post debridement wound bed appears to be doing much better. Electronic Signature(s) Signed: 04/04/2020 12:49:00 PM By: Worthy Keeler [name] Entered By: Worthy Keeler on 04/04/2020 12:49:00 [name]  ([id number]) -------------------------------------------------------------------------------- Physician Orders Details Patient Name: [name] Date of Service: 04/04/2020 11:30 AM Medical Record Number: [id number] Patient Account Number: [id number] Date of Birth/Sex: 1945-09-22 (74 y.o. M) Treating RN: Cornell Barman Primary Care Provider: [name] Other Clinician: Referring Provider: [name] Treating Provider/Extender: Skipper Cliche in Treatment: 17 Verbal / Phone Orders: No Diagnosis Coding ICD-10 Coding Code Description E11.621 Type 2 diabetes mellitus with foot ulcer L97.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scle L97.512 Non-pressure chronic ulcer of other part of right foot with fat layer exposed E11.42 Type 2 diabetes  mellitus with diabetic polyneuropathy M21.372 Foot drop, left foot Wound Cleansing Wound #7 Left,Lateral,Plantar Foot o Clean wound with Normal Saline. Wound #9 Right,Dorsal Foot o Clean wound with Normal Saline. Primary Wound Dressing Wound #7 Left,Lateral,Plantar Foot o Silver Collagen - moisten with hydrogel Wound #9 Right,Dorsal Foot o Silver Collagen - moisten with hydrogel Secondary Dressing Wound #7 Left,Lateral,Plantar Foot o Boardered Foam Dressing Wound #9 Right,Dorsal Foot o Boardered Foam Dressing Dressing Change Frequency Wound #7 Left,Lateral,Plantar Foot o Change Dressing Monday, Wednesday, Friday Wound #9 Right,Dorsal Foot o Change Dressing Monday, Wednesday, Friday Follow-up Appointments Wound #7 Left,Lateral,Plantar Foot o Return Appointment in 2 weeks. Wound #9 Right,Dorsal Foot o Return Appointment in 2 weeks. Edema Control Wound #7 Left,Lateral,Plantar Foot o Elevate legs to the level of the heart and pump ankles as often as possible Wound #9 Right,Dorsal Foot o Elevate legs to the level of the heart and pump ankles as often as  possible Off-Loading [name], [name]. ([id number]) Wound #7 Left,Lateral,Plantar Foot o Open toe surgical shoe with peg assist. o Other: - No weight on foot, Wheelchair only Wound #9 Right,Dorsal Foot o Open toe surgical shoe o Open toe surgical shoe with peg assist. o Other: - No weight on foot, Wheelchair only o Other: - No weight on foot, Wheelchair only Additional Orders / Instructions Wound #7 Left,Lateral,Plantar Foot o Increase protein intake. Wound #9 Right,Dorsal Foot o Increase protein intake. Home Health Wound #7 [place] Visits - [facility]; please see patient on Friday when he does not have a wound care visit. o Please direct any NON-WOUND related issues/requests for orders to patient's Primary Care Physician Wound #9 [place] Visits - [facility]; please see patient on Friday when he does not have a wound care visit. o Please direct any NON-WOUND related issues/requests for orders to patient's Primary Care Physician Electronic Signature(s) Signed: 04/04/2020 1:58:59 PM By: Worthy Keeler [name] Signed: 04/08/2020 3:14:13 PM By: Gretta Cool, BSN, RN, CWS, Kim RN, BSN Entered By: Gretta Cool, BSN, RN, CWS, Kim on 04/04/2020 11:46:15 [name] ([id number]) -------------------------------------------------------------------------------- Problem List Details Patient Name: [name] Date of Service: 04/04/2020 11:30 AM Medical Record Number: [id number] Patient Account Number: [id number] Date of Birth/Sex: April 26, 1945 (74 y.o. M) Treating RN: Cornell Barman Primary Care Provider: [name] Other Clinician: Referring Provider: [name] Treating Provider/Extender: Skipper Cliche in Treatment: 17 Active Problems ICD-10 Encounter Code Description Active Date MDM Diagnosis E11.621 Type 2 diabetes mellitus with foot ulcer 12/03/2019 No Yes L97.523 Non-pressure chronic  ulcer of other part of left foot with necrosis of 12/03/2019 No Yes muscle L97.512 Non-pressure chronic ulcer of other part of right foot with fat layer 02/06/2020 No Yes exposed E11.42 Type 2 diabetes mellitus with diabetic polyneuropathy 12/03/2019 No Yes M21.372 Foot drop, left foot 12/03/2019 No Yes Inactive Problems Resolved Problems Electronic Signature(s) Signed: 04/04/2020 11:28:00 AM By: Worthy Keeler [name] Entered By: Worthy Keeler on 04/04/2020 11:28:00 [name] ([id number]) -------------------------------------------------------------------------------- Progress Note Details Patient Name: [name] Date of Service: 04/04/2020 11:30 AM Medical Record Number: [id number] Patient Account Number: [id number] Date of Birth/Sex: [birth year] (74 y.o. M) Treating RN: Cornell Barman Primary Care Provider: [name] Other Clinician: Referring Provider: [name] Treating Provider/Extender: Skipper Cliche in Treatment: 17 Subjective Chief Complaint Information obtained from Patient Left plantar foot ulcer s/p surgical debridement and wound VAC placement. Left great toe ulcer. History of Present Illness (HPI) 10/04/16 on evaluation today patient  presents for initial evaluation concerning a laceration of the right wrist and hand on the bowler aspect. He tells me that he fell into glass following a medication change in regard to his diabetes. He initially went to [place] [facility] ER where she was sutured and subsequently his daughter who is a Marine scientist removed the teachers at the appropriate time. Unfortunately the wound has done poorly and dehissed which has caused him some trouble including infection. He has been on antibiotics both topically and orally though the wound continues to show signs of necrotic tissue according to notes which is what he was sent to [place] for evaluation concerning. He does have diabetes and unfortunately this is severely uncontrolled. He does see  an endocrinologist and is working on this. 10/11/2016 -- the patient's notes from the ER were reviewed and he had his suturing done on 09/17/2016 and had necrotic skin flaps which had to be trimmed the last visit he was here. He has managed to get his Santyl ointment and has been using this appropriately. 10/21/16 patient's wound appears to be doing very well on evaluation today. He still has some Slough covering the wound bed but this is nowhere near as severe as when i saw this initially two weeks ago nor even my review of his pictures last week. I'm pleased with how this is progressing. Patient's wound over the right form appears to be doing fairly well on evaluation today. There is no evidence of infection and he has a small area at the crease of where she is wrist extends and flexes that is still open and appears to be having difficulty due to the fact that it is cracking open. I think this is something that may be controlled with moisture control that is keeping the wound a little bit more moist. Nonetheless this is a tiny region compared to the initial wound and overall appears to be doing very well. No fevers, chills, nausea, or vomiting noted at this time. 11/25/16 on evaluation today patient appears to be doing well in regard to his right wrist ulcer. In fact this appears to be completely healed he is having no discomfort. He is pleased with how this is progressed. Readmission: 06/24/17 on evaluation today patient appears back in our clinic regarding issues that he has been having with his right ankle on the lateral malleolus as well is in the dorsal surface of his left foot. Both have been going on for several weeks unfortunately. He tells me that initially this was being managed by Dr. Edilia Bo and I did review the notes from Dr. Edilia Bo in epic. Patient was placed on amoxicillin initially and then subsequently Keflex following. He has been using triple antibiotic ointment over-the-counter  along with a Band-Aid for the wound currently. He did not have any Santyl remaining. His most recent hemoglobin A1c which was just two days ago was 10.3. Fortunately he seems to have excellent blood flow in the bilateral lower extremities with his left ABI being 0.95 in the right ABI 1.01. Patient does have some pain in regard to each of these ulcerations but he tells me it's not nearly as severe as the hand wound that I help treat earlier over the summer. This was in 2018. 07/15/17 on evaluation today patient's lower surety ulcer is actually both appear to be doing fairly well at this point. He does have a little bit of skin overlapping the ulcer on the right lateral ankle this was debrided away today just with saline and  gauze without complication patient had no discomfort or pain. Otherwise patient's left foot ulcer does appear to be showing signs of improvement he does seem to have a right great toe. Nikki and noted at this point in time. I think that this does need to be managed at this point. No fevers, chills, nausea, or vomiting noted at this time. 07/22/17 on evaluation today patient's wounds in regard to his lower extremities appear to be doing somewhat better. His right ankle wound in fact appears to likely be completely closed. The left dorsal foot ulcer though still open does appear to be a little bit smaller and does also seem to be making good progress. He has not been having problems with the treatment at this point in the general he does not seem to show as much erythema surrounding the wound bed which is good news. He is not having significant discomfort. 08/12/17 on evaluation today patients wounds of both appeared to be doing better in fact they both appear to be completely healed. Overall I'm very pleased with how things have progressed over the past week even more than what I expected. Obviously this is good news and he is happy about this. He did have a fall when going to his  orthopedic doctor recently fortunately he does not appear to have injured anything severely but nonetheless does have a couple of bumps and bruises nothing significant at this time but he needs to be seen and wound care for. He may have broken his left wrist he is following up with orthopedics in that regard. Readmission: 10/24/17 on evaluation today patient presents for reevaluation after having been discharged Aug 12, 2017. His wound was healed at that point although he states that it has been somewhat discolored since that time. He has not experienced the drainage as far as the wound is concerned but the fact that it was still discolored been greater than two months after the fact made him want to come in for reevaluation. Upon inspection today the patient has no pain although he does have neuropathy. He does have a discolored region of the dorsal surface of the left foot which was the site where the ulcer was that we previously treated. The good news is he does not again have any discomfort although obviously I do believe this may be some scar tissue and just discoloration in that regard. Again he hasn't had any drainage or discharge since I last saw him and at this point I see no evidence of that either this area shows complete epithelialization. In general I do not feel like there's any significant issue at this point in that regard. [name], [name] ([id number]) 02/09/2019 This is a 74 year old man with type 2 diabetes poorly controlled with significant peripheral neuropathy. He has been in this clinic before in 2018 with a laceration on his right hand and then again in 2019 with an area over the right lateral malleolus. 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 He states that the wound is been there for the last week or 2. It is been uncomfortable to walk on. The man is an active man having to look  after an elderly mother. Past medical history, type 2 diabetes with polyneuropathy with a recent hemoglobin A1c in September 2011 0.3, lumbar disc disease, hypergonadism., Left foot drop and gout. We did not repeat his ABIs in the clinic  quoting values from July 2019 of 0.95 and 1.01. He is not felt to have significant PAD 02/19/2019 on evaluation today patient actually appears to be doing better with regard to his foot wound based on what I am seeing currently. He was seen by Dr. Dellia Nims on readmission back into the clinic last week when I was off. The good news is the patient has been tolerating the dressing changes without any complication in fact has not really had much in the way of drainage. He does have an AFO brace which she believes is actually pushing on this area as well and has contributed to the issue. Fortunately there is no signs of active infection at this point. His ABIs appear to be well. 03/05/2019 on evaluation today patient appears to be doing well in regard to his foot ulcer. He has been tolerating the dressing changes without complication. Fortunately there is no signs of active infection. The wound is measuring somewhat smaller and overall I feel like he is making good progress at this point. 03/12/2019 on evaluation today patient actually appears to be doing quite well with regard to his foot ulcer. The wound is looking better the measurement is not significantly better at this point unfortunately but nonetheless again the overall appearance is improving I think he is making some progress. I still think the big issue here is foot drop and again the patient wants to see if potentially he could get a brace to help with this. Subsequently I think that we can definitely make a referral to [facility] to see if they can help [place] in this regard. He is in agreement with that plan. 03/26/2019 on evaluation today patient appears to be doing more poorly in regard to his foot ulcer at this  time. He did see podiatry in order to see about getting a brace but unfortunately he tells me that Medicare is not likely to pay for one for him as he had 1 I guess 8 months ago. However this one that apparently goes in his shoes that does not seem to be working out well for him. Unfortunately there are signs of active infection at this time. No fevers, chills, nausea, vomiting, or diarrhea. Systemically. The patient has erythema on the dorsal surface of his foot that tracks from the lateral portion of his foot and again he does have a blister around the wound as well which is worse compared to previous. Overall I am concerned. 04/02/2019 on evaluation today patient appears to be doing about the same with regard to his wound. Unfortunately there is no signs of significant improvement although I do think there is no signs of infection which is good news. His drainage is also slowed down tremendously. I think at this point he would benefit from the initiation of a total contact cast. The only concern I have is that over his balance. I explained to the patient that if we were to initiate the cast I think it would be greatly beneficial for him but he would need to ensure not to walk at all unless he uses a walker he actually has 2 walkers one for inside his home and one that he keeps out in the car. For that reason he would be covered in that regard. I just do not want him to have any accidents and fall with the cast. 12/28-Patient comes in 1 week out a day early for his appointment he was in the hospital for 2 days for uncontrolled hyperglycemia and  required management of high blood sugars, with some adjustments in his home regimen of insulin, patient stubbed his right great toe today and put a Band-Aid on it, he is here for his left lateral foot ulcer and we are using silver alginate dressing, he was intact total contact cast with difficulty with balance to the extent that that had to be  discontinued 04/16/2019 on evaluation today patient appears to be doing better with regard to the overall appearance of his wound size. This is good news. We had attempted a total contact cast but unfortunately he had some falling issues that the family contacted [place] about we took the cast off we did not put it back on. Nonetheless it ended up that just a couple days later he was in the hospital due to elevations in his blood sugar which went to around 500 and even a little higher. Subsequently he was admitted to the hospital and was in the hospital for a total of 3 days and that included Christmas day. Fortunately there got his blood sugar under control he tells me that this is running about 200 now which is much better news. There does not appear to be any signs of active infection at this time. No fevers, chills, nausea, vomiting, or diarrhea. 04/30/2019 on evaluation today patient actually appears to be doing slightly worse compared to previous as far as the overall size of his wound is concerned. There also is some need for sharp debridement today as well. Fortunately there is no evidence of active infection which is good news. No fevers, chills, nausea, vomiting, or diarrhea. 05/07/2019 upon evaluation today patient appears to be doing excellent in regard to his plantar foot ulcer. He has been tolerating the dressing changes without complication. Fortunately there is no signs of active infection at this time. No fevers, chills, nausea, vomiting, or diarrhea. He does tell me that he actually got in touch with [employer] and they feel like they can add diagnosis codes to his orders in order to get an external brace for him as opposed to the one that goes internal to issue. That would be excellent for his foot drop and I did tell him that if there is anything I need to fill out in that regard I will be more than happy to oblige and fill out what is needed for [employer]. 05/14/2019 upon evaluation today  patient's original wound site actually appears to be doing much better which is good news. Fortunately there is no signs of active infection at this time. No fevers, chills, nausea, vomiting, or diarrhea. With that being said the patient unfortunately is continuing to have issues with rubbing he tells me that the felt slid on his foot although he did not realize it until today. Nonetheless I think this did cause a blister around the edge of the wound and he does an open wound that is slightly larger overall in measurement compared to last time mainly due to this blister. Other than that I feel like he is doing quite well. 05/21/2019 upon evaluation today patient appears to be doing excellent in regard to his wound at this time on the plantar foot. He is showing signs of improvement he does have some slough and biofilm noted on the surface of the wound this is going require some sharp debridement today as well. Fortunately there is no evidence of active infection. No fevers, chills, nausea, vomiting, or diarrhea. 05/28/2019 on evaluation today patient's wound actually appears to be measuring smaller  upon initial inspection and I do believe that it is doing well. With that being said he continues to have a blister on the outside/lateral edge of the wound that occurs as a result of rubbing due to his dropfoot currently. Subsequently we have filled out the paperwork for his new external brace but again that does take several weeks to get that completed once everything is returned to back we did send this back last week as far as the paperwork was concerned. With that being said there does not appear to be any signs of active infection at this time. 06/04/2019 on evaluation today patient's wound actually appears to be doing slightly better compared to last week which is good news. He still [name], [name] ([id number]) states that his insurance unfortunately does not seem to want to cover any of his new brace.  He is going to just pay for this himself. Nonetheless he is going by there today when he leaves here that is [employer] in [place] that is going to be making this brace for him. 06/18/2019 upon evaluation today patient fortunately does have his new brace which is the external AFO brace with bilateral uprights. Subsequently he seems to be doing much better in fact the wound already appears better. He has not even been using the felt he is just using a dressing over top of the wound and overall I am very pleased with how things seem to be progressing. There is no signs of active infection at this time which is great news and I think that the AFO is good to help him tremendously as far as trying to keep pressure off of the 06/25/2019 upon evaluation today patient seems to be making good progress in regard to his wound. He has been tolerating the dressing changes without complication. Fortunately there is no signs of active infection at this time. No fevers, chills, nausea, vomiting, or diarrhea. 07/02/2019 upon evaluation today patient unfortunately had a blistered area on the bottom of his foot tracking more towards the midline of the plantar aspect. He does not seem to have issues with rubbing more laterally that he used to have but nonetheless this is still giving him some trouble and causing problems here. Fortunately there is no signs of active infection at this time. No fevers, chills, nausea, vomiting, or diarrhea. 07/09/2019 upon evaluation today patient appears to be doing well with regard to his plantar foot ulcer. Definitely this is much improved compared to last week's visit which again last week the issue there was that he had an area of callus buildup on the plantar aspect of his foot I was able to clean this away and after effectively doing so the wound seems to be doing much better which is great news. There is no signs of active infection at this time. 07/16/2019 upon evaluation today patient  appears to be making some progress in my opinion with regard to his plantar foot ulcer. He has been tolerating the dressing changes without complication. Fortunately there is no evidence of active infection at this time which is great news. 07/23/2019 upon evaluation today patient appears to be doing well with regard to his wounds on the plantar foot. He has been tolerating the dressing changes without complication. These do seem to be healing. 08/06/2019 upon evaluation today patient's wound actually appears to be showing some signs of improvement though he still has a significant amount of callus buildup over this location. That is can require some debridement today. With  that being said the actual wound openings seem to be dramatically improved which is great news. There is no signs of active infection at this time. No fevers, chills, nausea, vomiting, or diarrhea. 08/13/2019 upon evaluation today patient appears to be doing well with regard to his foot ulcer. He has been tolerating the dressing changes without complication. Fortunately there is no signs of active infection at this time. No fevers, chills, nausea, vomiting, or diarrhea. 08/20/2019 upon evaluation today patient appears to be doing excellent in regard to his foot ulcer. I do believe he still showing signs of improvement this is very slow but again I think still pressure getting to the area even with his brace and custom shoe is still part of the main issue with the delay here. Nonetheless I do not see any signs of worsening or infection all of which is good news. 5/20; left lateral foot wound. He wears an AFO brace for foot drop related to his diabetes. The wound is small and clean looking he is using silver collagen. We have apparently tried him in offloading shoe he could not tolerate this. He does not have good balance. 09/11/2019 upon evaluation today patient appears to be doing more poorly in regard to his foot ulcer as compared to last  time I saw him. Unfortunately he does tell me as we discussed on Friday that he had pus and blood coming from the area underneath the callus. Unfortunately I think that this is a combination of the callus not being trimmed away enough and potentially the felt pad causing an abnormal rubbing which has happened before which led to this becoming more irritated and likely infected based on what I am seeing today. The patient takes ampicillin all the time. Subsequently he also states that he has been trying for a couple of days to take the clindamycin of which she had some leftover from previous. With that being said it upset his stomach. The antibiotic that I sent in for him on Friday unfortunately I wrote the wrong number on as far as the number of pills that he needed. With that being said I corrected that this morning but he tells me is not can pick it up as that seems to be making him have trouble with his stomach being upset at this point. Fortunately there is no signs of active infection at this time systemically. 09/17/2019 upon evaluation today patient appears to be doing better in regard to his foot ulcer. Has been taking ampicillin that he had leftover from a previous prescription. With that being said he did go see his doctor today concerning his diabetes and notes that his hemoglobin A1c was 13 obviously this is elevated and there can be working on that. Also did review the culture result that we finally got back for final well that showed rare Pseudomonas and a few group B strep noted. The Pseudomonas was sensitive to Cipro but again the wound is doing so much better with the ampicillin I think this is probably not a causative organism I do not see anything that really has me concerned as far as infection is concerned today. Therefore I would hold off on prescribing Cipro for the patient and have him discontinue the ampicillin until completion. 09/24/2019 upon evaluation today patient appears to  be doing 09/24/2019 upon evaluation today patient appears to be doing well with regard to his foot ulcer. He actually has not enlarged any and seems to be measuring smaller quite significantly at this  point. His toe ulcer is also doing somewhat better which is good news. Fortunately there is no evidence of active infection at this time. No fevers, chills, nausea, vomiting, or diarrhea. 10/08/2019 upon evaluation today patient appears to be doing well with regard to his foot ulcer all things considered. Fortunately there is no signs of active infection at this time. No fevers, chills, nausea, vomiting, or diarrhea. Fortunately he states that overall he has been doing much better with regard to not have any blistering on the side of his foot. Unfortunately he did have a fall last night due to the fact that he got up to walk without his brace on and did not have his walker either it was around 4 in the morning and his mother was calling out. Subsequently he states that the foot drop got him and he ended up following. Nonetheless he states he normally uses his walker I explained he needs to try to make sure not to ever go without the walker. No matter how attempting the situation may be. Readmission: 12/03/2019 upon evaluation today patient presents for follow-up after having had surgery last week on his foot for surgical debridement and then subsequent wound VAC placement. He had gone into the hospital as he was having a significant infection and not feeling well. He is unfortunately since I last saw him also been under the care of the cancer doctors and they have been taking good care of him. He does seem to be in fairly high spirits all things considered. With that being said he has lung cancer he tells me that his back from the radiation feels like it is constantly burned. 12/10/2019 on evaluation today patient appears to be doing well with regard to his wound. I do believe the wound VAC is doing a great  job at helping to manage his drainage and overall health and to granulate in the wound. He has less undermining noted today in fact almost none. Home health is coming out to change the wound VAC on a regular basis Monday, Wednesday, and Friday. 12/24/2019 on evaluation today patient appears to be doing fairly well in regard to the wound on the plantar aspect of his foot. With that being said he does have a significant feeling in of what has been going on with regard to the ulceration which does not have the depth that it did in the [place] ([phone]) past. With that being said I do believe that based on what I am seeing at this point the patient likely does need to take a break from the wound VAC and in fact it appears the wound VAC was actually placed directly on the skin as far as the bridging was concerned causing some irritation of the top of the patient's foot. This has me concerned as well that if were not careful that could worsen. I do believe a break with the wound VAC however will be fine and then we can see where we go from there. 01/07/2020 upon evaluation today patient actually appears to be doing much better in regard to his wound. He has been tolerating the dressing changes without complication. There is no signs of active infection and overall I feel like he is managing quite nicely. I do believe that we would likely discontinue the [facility] altogether today. 10/13; follow-up visit. Patient has a area on the left lateral plantar foot. He has been using Hydrofera Blue and bordered foam he has home health changing this 3 times  a week. He is nonambulatory. He avoids pushing the wheelchair with that part of his foot and instead uses his heel. We have had a nice improvement today and overall surface area 10/27; 2-week follow-up. He has been using Hydrofera Blue and bordered foam on a wound on the left lateral foot. He told me he was nonambulatory last time from foot drop although he  walked in the clinic with a walker this week apparently did not have any transportation. Also he has a new wound on the right dorsal medial foot which she says was caused by foot where trauma 11/10; 2-week follow-up. We have been using Hydrofera Blue on the left lateral foot roughly at the base of the fifth metatarsal. He came in last visit with a punched out area on the right dorsal foot. He says this was foot where trauma. I reiterated that he is not wearing his previous braces and he is in. He spends most of his time in his wheelchair. He says he is careful not to be pushing himself around in the wheelchair with his feet. He is wearing regular tennis shoes. I taken a bit of time to review the history of the wound on the left foot. This actually was a chronic wound for him. Episodic infections. He had arterial studies done in August these were normal. ABI in the right of 1.05, TBI at 0.65 with triphasic waveforms on the left 1.01 with a TBI of 0.78 and again triphasic waveforms. Does not appear to be a arterial issue 11/17 we brought the patient in today to look at the area on the right dorsal foot and the left lateral plantar foot. The left was the original wound. Patient states the right dorsal foot happened from trauma from his brace. He is no longer using this. He is mostly in surgical shoes that we provide except when he goes out driving. We changed him to silver collagen last week. He does not have an arterial issue He came to the clinic today got out of his car went to the back of his car felt presyncopal and fell down. When we got out there to help him up he was alert conversational. Blood pressure little reduced at 277 systolic: We got him in the exam room. Otherwise he looks fine 03/21/2020 upon evaluation today patient appears to be doing decently well in regard to his ulcers on the bilateral feet. Fortunately there is no signs of active infection at this time which is great news. No  fever chills noted. He has been tolerating the dressing changes without complication. Fortunately there is no sign of active infection at this time. No fevers, chills, nausea, vomiting, or diarrhea. 04/04/2020 upon evaluation today patient's wounds actually are showing signs of improvement at both locations. There is good be some sharp debridement necessary on the plantar foot but overall not much compared to where things have been in the past. Overall I feel like he is doing quite well Objective Constitutional Well-nourished and well-hydrated in no acute distress. Vitals Time Taken: 11:32 AM, Height: 71 in, Weight: 190 lbs, BMI: 26.5, Pulse: 71 bpm, Respiratory Rate: 18 breaths/min, Blood Pressure: 125/78 mmHg. Respiratory normal breathing without difficulty. Psychiatric this patient is able to make decisions and demonstrates good insight into disease process. Alert and Oriented x 3. pleasant and cooperative. General Notes: Upon inspection patient's wound bed on the plantar foot did require sharp debridement clear away some of the necrotic debris I did perform this today without any pain  or complication removing callus, slough, biofilm, all down to good subcutaneous tissue with minimal bleeding. He tolerated this today without complication post debridement wound bed appears to be doing much better. Integumentary (Hair, Skin) Wound #7 status is Open. Original cause of wound was Surgical Injury. The wound is located on the [place]. The wound measures 0.2cm length x 1cm width x 0.2cm depth; 0.157cm^2 area and 0.031cm^3 volume. There is Fat Layer (Subcutaneous Tissue) exposed. There is undermining starting at 1:00 and ending at 1:00 with a maximum distance of 0.1cm. There is a medium amount of serosanguineous drainage noted. The wound margin is thickened. There is small (1-33%) red, pink, hyper - granulation within the wound bed. There is a small (1-33%) amount of necrotic tissue  within the wound bed including Adherent Slough. Wound #9 status is Open. Original cause of wound was Pressure Injury. The wound is located on the Right,Dorsal Foot. The wound measures 0.5cm length x 0.4cm width x 0.3cm depth; 0.157cm^2 area and 0.047cm^3 volume. There is Fat Layer (Subcutaneous Tissue) exposed. There is a medium amount of serosanguineous drainage noted. The wound margin is flat and intact. There is no granulation within the wound bed. There is a large (67-100%) amount of necrotic tissue within the wound bed including Adherent Slough. 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 There was a Excisional Skin/Subcutaneous Tissue Debridement with a total area of 0.2 sq cm performed by Tommie Sams., [name]. With the following instrument(s): Curette to remove Viable and Non-Viable tissue/material. Material removed includes Subcutaneous Tissue and Slough and. No specimens were taken. A time out was conducted at 11:44, prior to the start of the procedure. A Minimum amount of bleeding was controlled with Pressure. The procedure was tolerated well. Post Debridement Measurements: 0.3cm length x 1cm width x 0.3cm depth; 0.071cm^3 volume. Character of Wound/Ulcer Post Debridement is stable. Severity of Tissue Post Debridement is: Limited to breakdown of skin. Post procedure Diagnosis Wound #7: Same as Pre-Procedure Plan Wound Cleansing: Wound #7 Left,Lateral,Plantar Foot: Clean wound with Normal  Saline. Wound #9 Right,Dorsal Foot: Clean wound with Normal Saline. Primary Wound Dressing: Wound #7 Left,Lateral,Plantar Foot: Silver Collagen - moisten with hydrogel Wound #9 Right,Dorsal Foot: Silver Collagen - moisten with hydrogel Secondary Dressing: Wound #7 Left,Lateral,Plantar Foot: Boardered Foam Dressing Wound #9 Right,Dorsal Foot: Boardered Foam Dressing Dressing Change Frequency: Wound #7 Left,Lateral,Plantar Foot: Change Dressing Monday, Wednesday, Friday Wound #9 Right,Dorsal Foot: Change Dressing Monday, Wednesday, Friday Follow-up Appointments: Wound #7 Left,Lateral,Plantar Foot: Return Appointment in 2 weeks. Wound #9 Right,Dorsal Foot: Return Appointment in 2 weeks. Edema Control: Wound #7 Left,Lateral,Plantar Foot: Elevate legs to the level of the heart and pump ankles as often as possible Wound #9 Right,Dorsal Foot: Elevate legs to the level of the heart and pump ankles as often as possible Off-Loading: Wound #7 Left,Lateral,Plantar Foot: Open toe surgical shoe with peg assist. Other: - No weight on foot, Wheelchair only Wound #9 Right,Dorsal Foot: Open toe surgical shoe Open toe surgical shoe with peg assist. Other: - No weight on foot, Wheelchair only [place], [employer]  E. ([id number]) Other: - No weight on foot, Wheelchair only Additional Orders / Instructions: Wound #7 Left,Lateral,Plantar Foot: Increase protein intake. Wound #9 Right,Dorsal Foot: Increase protein intake. Home Health: Wound #7 Left,Lateral,Plantar Foot: Continue Home Health Visits - [facility]; please see patient on Friday when he does not have a wound care visit. Please direct any NON-WOUND related issues/requests for orders to patient's Primary Care Physician Wound #9 Right,Dorsal Foot: [place] Visits - [facility]; please see patient on Friday when he does not have a wound care visit. Please direct any NON-WOUND related issues/requests for orders to patient's Primary Care  Physician 1. I would recommend currently that we going continue with the wound care measures as before. The patient is in agreement with that plan. This includes the use of the silver collagen dressing which I think is still the right way to go. 2. I am also can recommend currently that we have the patient continue to monitor for any evidence of worsening infection. Right now I see no evidence whatsoever of infection but definitely this is what we would keep an eye on. 3. I am also can recommend the patient continue to offload is much as possible to try to keep pressure off the area. I think that is of utmost importance. We will see patient back for reevaluation in 2 weeks here in the clinic. If anything worsens or changes patient will contact our office for additional recommendations. Electronic Signature(s) Signed: 04/04/2020 12:49:51 PM By: Worthy Keeler [name] Entered By: Worthy Keeler on 04/04/2020 12:49:51 [name] ([id number]) -------------------------------------------------------------------------------- SuperBill Details Patient Name: [name] Date of Service: 04/04/2020 Medical Record Number: [id number] Patient Account Number: [id number] Date of Birth/Sex: [birth year] (74 y.o. M) Treating RN: Cornell Barman Primary Care Provider: [name] Other Clinician: Referring Provider: [name] Treating Provider/Extender: Skipper Cliche in Treatment: 17 Diagnosis Coding ICD-10 Codes Code Description E11.621 Type 2 diabetes mellitus with foot ulcer L97.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scle L97.512 Non-pressure chronic ulcer of other part of right foot with fat layer exposed E11.42 Type 2 diabetes mellitus with diabetic polyneuropathy M21.372 Foot drop, left foot Facility Procedures CPT4 Code: 84696295 Description: 28413 - DEB SUBQ TISSUE 20 SQ CM/< Modifier: Quantity: 1 CPT4 Code: Description: ICD-10  Diagnosis Description L97.523 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 m L97.512 Non-pressure chronic ulcer of other part of right foot with fat layer ex Modifier: uscle posed Quantity: Physician Procedures CPT4 Code: 2440102 Description: 11042 - WC PHYS SUBQ TISS 20 SQ CM Modifier: Quantity: 1 CPT4 Code: Description: ICD-10 Diagnosis Description L97.523 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 m L97.512 Non-pressure chronic ulcer of other part of right foot with fat layer ex Modifier: uscle posed Quantity: Electronic Signature(s) Signed: 04/04/2020 12:50:33 PM By: Worthy Keeler [name] Entered By: Worthy Keeler on 04/04/2020 12:50:32

## 2020-04-08 NOTE — Progress Notes (Signed)
[name] ([id number]) Visit Report for 04/04/2020 Arrival Information Details Patient Name: [name] Date of Service: 04/04/2020 11:30 AM Medical Record Number: [id number] Patient Account Number: [id number] Date of Birth/Sex: Mar 10, 1946 (74 y.o. M) Treating RN: Cornell Barman Primary Care Bradshaw Minihan: [name] Other Clinician: Referring Kaamil Morefield: [name] Treating Elaysha Bevard/Extender: Skipper Cliche in Treatment: 26 Visit Information History Since Last Visit Has Footwear/Offloading in Place as Prescribed: Yes Patient Arrived: Wheel Chair Left: Surgical Shoe with Pressure Relief Arrival Time: 11:31 Insole Accompanied By: self Right: Surgical Shoe with Pressure Relief Transfer Assistance: None Insole Patient Identification Verified: Yes Pain Present Now: No Secondary Verification Process Completed: Yes Patient Requires Transmission-Based Precautions: No Electronic Signature(s) Signed: 04/08/2020 3:14:13 PM By: Gretta Cool, BSN, RN, CWS, Kim RN, BSN Entered By: Gretta Cool, BSN, RN, CWS, Kim on 04/04/2020 11:32:21 [name] ([id number]) -------------------------------------------------------------------------------- Encounter Discharge Information Details Patient Name: [name] Date of Service: 04/04/2020 11:30 AM Medical Record Number: [id number] Patient Account Number: [id number] Date of Birth/Sex: Jul 06, 1945 (74 y.o. M) Treating RN: Cornell Barman Primary Care Chandler Stofer: [name] Other Clinician: Referring Alexina Niccoli: [name] Treating Shavon Ashmore/Extender: Skipper Cliche in Treatment: 17 Encounter Discharge Information Items Post Procedure Vitals Discharge Condition: Stable Unable to obtain vitals Reason: . Ambulatory Status: Ambulatory Discharge Destination: Home Transportation: Private Auto Accompanied By: grandson Schedule Follow-up Appointment: Yes Clinical Summary of Care: Electronic Signature(s) Signed: 04/04/2020 2:09:58  PM By: Gretta Cool, BSN, RN, CWS, Kim RN, BSN Entered By: Gretta Cool, BSN, RN, CWS, Kim on 04/04/2020 14:09:58 [name] ([id number]) -------------------------------------------------------------------------------- Lower Extremity Assessment Details Patient Name: [name] Date of Service: 04/04/2020 11:30 AM Medical Record Number: [id number] Patient Account Number: [id number] Date of Birth/Sex: 24-Mar-1946 (74 y.o. M) Treating RN: Cornell Barman Primary Care Ivah Girardot: [name] Other Clinician: Referring Belen Pesch: [name] Treating Honestii Marton/Extender: Skipper Cliche in Treatment: 17 Edema Assessment Assessed: [Left: No] [Right: No] Edema: [Left: No] [Right: No] Vascular Assessment Pulses: Dorsalis Pedis Palpable: [Left:Yes] [Right:Yes] Electronic Signature(s) Signed: 04/08/2020 3:14:13 PM By: Gretta Cool, BSN, RN, CWS, Kim RN, BSN Entered By: Gretta Cool, BSN, RN, CWS, Kim on 04/04/2020 11:38:46 [name] ([id number]) -------------------------------------------------------------------------------- Multi Wound Chart Details Patient Name: [name] Date of Service: 04/04/2020 11:30 AM Medical Record Number: [id number] Patient Account Number: [id number] Date of Birth/Sex: 02/18/1946 (74 y.o. 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] [9:Diabetic Wound/Ulcer of the Lower N/A Extremity] Secondary  Etiology: [7:N/A] [9:Pressure Ulcer] [N/A:N/A] Comorbid History: [7:Cataracts, Middle ear problems, Sleep Apnea, Hypertension, Type II Diabetes, Gout, Osteoarthritis, Neuropathy, Received Chemotherapy] [9:N/A] [N/A:N/A] Date Acquired: [7:11/23/2019] [9:01/23/2020] [N/A:N/A] Weeks of Treatment: [7:17] [9:8] [N/A:N/A] Wound Status: [7:Open] [9:Open] [N/A:N/A] Measurements L x W x D (cm) [7:0.2x1x0.2] [9:0.5x0.4x0.3] [N/A:N/A] Area (cm) : [7:0.157] [9:0.157] [N/A:N/A] Volume (cm) : [7:0.031] [9:0.047] [N/A:N/A] % Reduction in Area: [7:97.50%] [9:50.00%] [N/A:N/A] % Reduction in Volume: [7:99.00%] [9:-51.60%] [N/A:N/A] Starting Position 1 (o'clock): [7:1] Ending Position 1 (o'clock): [7:1] Maximum Distance 1 (cm): [7:0.1] Undermining: [7:Yes] [9:N/A] [N/A:N/A] Classification: [7:Grade 3] [9:Grade 2] [N/A:N/A] Exudate Amount: [7:Medium] [9:N/A] [N/A:N/A] Exudate Type: [7:Serosanguineous] [9:N/A] [N/A:N/A] Exudate Color: [7:red, brown] [9:N/A] [N/A:N/A] Wound Margin: [7:Thickened] [  9:N/A] [N/A:N/A] Granulation Amount: [7:Small (1-33%)] [9:N/A] [N/A:N/A] Granulation Quality: [7:Red, Pink, Hyper-granulation] [9:N/A] [N/A:N/A] Necrotic Amount: [7:Small (1-33%)] [9:N/A] [N/A:N/A] Exposed Structures: [7:Fat Layer (Subcutaneous Tissue): Yes Fascia: No Tendon: No Muscle: No Joint: No Bone: No Small (1-33%)] [9:N/A N/A] [N/A:N/A N/A] Treatment Notes Electronic Signature(s) Signed: 04/08/2020 3:14:13 PM By: Gretta Cool, BSN, RN, CWS, Kim RN, BSN Entered By: Gretta Cool, BSN, RN, CWS, Kim on 04/04/2020 11:42:49 [name] ([id number]) -------------------------------------------------------------------------------- Multi-Disciplinary Care Plan Details Patient Name: [name] Date of Service: 04/04/2020 11:30 AM Medical Record Number: [id number] Patient Account Number: [id number] Date of Birth/Sex: 1946/04/06 (74 y.o. M) Treating RN: Cornell Barman Primary Care Tylyn Derwin: [name] Other  Clinician: Referring Jyll Tomaro: [name] Treating Aryan Bello/Extender: Skipper Cliche in Treatment: 17 Active Inactive Necrotic Tissue Nursing Diagnoses: Impaired tissue integrity related to necrotic/devitalized tissue Goals: Necrotic/devitalized tissue will be minimized in the wound bed Date Initiated: 12/03/2019 Target Resolution Date: 12/07/2019 Goal Status: Active Interventions: Assess patient pain level pre-, during and post procedure and prior to discharge Treatment Activities: Apply topical anesthetic as ordered : 12/03/2019 Notes: Orientation to the Wound Care Program Nursing Diagnoses: Knowledge deficit related to the wound healing center program Goals: Patient/caregiver will verbalize understanding of the [place] Date Initiated: 12/03/2019 Target Resolution Date: 12/07/2019 Goal Status: Active Interventions: Provide education on orientation to the wound center Notes: Pain, Acute or Chronic Nursing Diagnoses: Pain Management - Cyclic Acute (Dressing Change Related) Goals: Patient will verbalize adequate pain control and receive pain control interventions during procedures as needed Date Initiated: 12/03/2019 Target Resolution Date: 12/07/2019 Goal Status: Active Interventions: Assess comfort goal upon admission Treatment Activities: Administer pain control measures as ordered : 12/03/2019 Notes: Wound/Skin Impairment Nursing Diagnoses: Impaired tissue integrity JERETT, ODONOHUE ([id number]) Goals: Ulcer/skin breakdown will have a volume reduction of 30% by week 4 Date Initiated: 12/03/2019 Target Resolution Date: 01/03/2020 Goal Status: Active Ulcer/skin breakdown will have a volume reduction of 50% by week 8 Date Initiated: 12/03/2019 Target Resolution Date: 02/02/2020 Goal Status: Active Ulcer/skin breakdown will have a volume reduction of 80% by week 12 Date Initiated: 12/03/2019 Target Resolution Date: 03/04/2020 Goal Status:  Active Interventions: Assess ulceration(s) every visit Treatment Activities: Referred to DME Brianny Soulliere for dressing supplies : 12/03/2019 Topical wound management initiated : 12/03/2019 Notes: Electronic Signature(s) Signed: 04/08/2020 3:14:13 PM By: Gretta Cool, BSN, RN, CWS, Kim RN, BSN Entered By: Gretta Cool, BSN, RN, CWS, Kim on 04/04/2020 11:42:29 [name] ([id number]) -------------------------------------------------------------------------------- Pain Assessment Details Patient Name: [name] Date of Service: 04/04/2020 11:30 AM Medical Record Number: [id number] Patient Account Number: [id number] Date of Birth/Sex: Apr 07, 1946 (74 y.o. M) Treating RN: Cornell Barman Primary Care Fancy Dunkley: [name] Other Clinician: Referring Harless Molinari: [name] Treating Mkayla Steele/Extender: Skipper Cliche in Treatment: 17 Active Problems Location of Pain Severity and Description of Pain Patient Has Paino No Site Locations Pain Management and Medication Current Pain Management: Electronic Signature(s) Signed: 04/08/2020 3:14:13 PM By: Gretta Cool, BSN, RN, CWS, Kim RN, BSN Entered By: Gretta Cool, BSN, RN, CWS, Kim on 04/04/2020 11:32:49 [name] ([id number]) -------------------------------------------------------------------------------- Wound Assessment Details Patient Name: [name] Date of Service: 04/04/2020 11:30 AM Medical Record Number: [id number] Patient Account Number: [id number] Date of Birth/Sex: March 24, 1946 (74 y.o. M) Treating RN: Cornell Barman Primary Care Maykel Reitter: [name] Other Clinician: Referring Ellwood Steidle: [name] Treating Laquitta Dominski/Extender: Skipper Cliche in Treatment: 17 Wound Status Wound Number: 7 Primary Diabetic Wound/Ulcer of the Lower Extremity Etiology: Wound Location: Left, Lateral, Plantar Foot Wound Open Wounding  Event: Surgical Injury Status: Date Acquired: 11/23/2019 Comorbid Cataracts, Middle ear problems, Sleep  Apnea, Weeks Of Treatment: 17 History: Hypertension, Type II Diabetes, Gout, Osteoarthritis, Clustered Wound: No Neuropathy, Received Chemotherapy Photos Photo Uploaded By: Gretta Cool, BSN, RN, CWS, Kim on 04/04/2020 14:31:41 Wound Measurements Length: (cm) 0.2 Width: (cm) 1 Depth: (cm) 0.2 Area: (cm) 0.157 Volume: (cm) 0.031 % Reduction in Area: 97.5% % Reduction in Volume: 99% Epithelialization: Small (1-33%) Undermining: Yes Starting Position (o'clock): 1 Ending Position (o'clock): 1 Maximum Distance: (cm) 0.1 Wound Description Classification: Grade 3 Wound Margin: Thickened Exudate Amount: Medium Exudate Type: Serosanguineous Exudate Color: red, brown Foul Odor After Cleansing: No Slough/Fibrino Yes Wound Bed Granulation Amount: Small (1-33%) Exposed Structure Granulation Quality: Red, Pink, Hyper-granulation Fascia Exposed: No Necrotic Amount: Small (1-33%) Fat Layer (Subcutaneous Tissue) Exposed: Yes Necrotic Quality: Adherent Slough Tendon Exposed: No Muscle Exposed: No Joint Exposed: No Bone Exposed: No Treatment Notes Wound #7 (Left, Lateral, Plantar Foot) JOSEPHMICHAEL, LISENBEE ([id number]) Notes Prisma Ag, Bordered foam Electronic Signature(s) Signed: 04/08/2020 3:14:13 PM By: Gretta Cool, BSN, RN, CWS, Kim RN, BSN Entered By: Gretta Cool, BSN, RN, CWS, Kim on 04/04/2020 11:42:20 [name] ([id number]) -------------------------------------------------------------------------------- Wound Assessment Details Patient Name: [name] Date of Service: 04/04/2020 11:30 AM Medical Record Number: [id number] Patient Account Number: [id number] Date of Birth/Sex: Mar 28, 1946 (74 y.o. M) Treating RN: Cornell Barman Primary Care Lorey Pallett: [name] Other Clinician: Referring Laiyla Slagel: [name] Treating Hadyn Azer/Extender: Skipper Cliche in Treatment: 17 Wound Status Wound Number: 9 Primary Diabetic Wound/Ulcer of the Lower Extremity Etiology: Wound  Location: Right, Dorsal Foot Secondary Pressure Ulcer Wounding Event: Pressure Injury Etiology: Date Acquired: 01/23/2020 Wound Open Weeks Of Treatment: 8 Status: Clustered Wound: No Comorbid Cataracts, Middle ear problems, Sleep Apnea, History: Hypertension, Type II Diabetes, Gout, Osteoarthritis, Neuropathy, Received Chemotherapy Photos Wound Measurements Length: (cm) 0.5 % Reduc Width: (cm) 0.4 % Reduc Depth: (cm) 0.3 Epithel Area: (cm) 0.157 Volume: (cm) 0.047 tion in Area: 50% tion in Volume: -51.6% ialization: Small (1-33%) Wound Description Classification: Grade 2 Foul O Wound Margin: Flat and Intact Slough Exudate Amount: Medium Exudate Type: Serosanguineous Exudate Color: red, brown dor After Cleansing: No /Fibrino Yes Wound Bed Granulation Amount: None Present (0%) Exposed Structure Necrotic Amount: Large (67-100%) Fascia Exposed: No Necrotic Quality: Adherent Slough Fat Layer (Subcutaneous Tissue) Exposed: Yes Tendon Exposed: No Muscle Exposed: No Joint Exposed: No Bone Exposed: No Treatment Notes Wound #9 (Right, Dorsal Foot) Notes Prisma Ag, Bordered foam WYLAN, GENTZLER ([id number]) Electronic Signature(s) Signed: 04/08/2020 3:14:13 PM By: Gretta Cool, BSN, RN, CWS, Kim RN, BSN Entered By: Gretta Cool, BSN, RN, CWS, Kim on 04/04/2020 11:43:24 [name] ([id number]) -------------------------------------------------------------------------------- [place] Details Patient Name: [name] Date of Service: 04/04/2020 11:30 AM Medical Record Number: [id number] Patient Account Number: [id number] Date of Birth/Sex: [birth year] (74 y.o. M) Treating RN: Cornell Barman Primary Care Rozann Holts: [name] Other Clinician: Referring Hila Bolding: [name] Treating Leannah Guse/Extender: Skipper Cliche in Treatment: 17 Vital Signs Time Taken: 11:32 Pulse (bpm): 71 Height (in): 71 Respiratory Rate (breaths/min): 18 Weight (lbs): 190 Blood Pressure  (mmHg): 125/78 Body Mass Index (BMI): 26.5 Reference Range: 80 - 120 mg / dl Electronic Signature(s) Signed: 04/08/2020 3:14:13 PM By: Gretta Cool, BSN, RN, CWS, Kim RN, BSN Entered By: Gretta Cool, BSN, RN, CWS, Kim on 04/04/2020 11:32:41

## 2020-04-10 ENCOUNTER — Other Ambulatory Visit: Payer: Self-pay | Admitting: *Deleted

## 2020-04-10 NOTE — Patient Outreach (Signed)
[facility]) Care Management  04/10/2020  MASAJI BILLUPS 1946/03/23 [id number]   Member's case discussed with multidisciplinary team due to him having a 30 day readmission.  Plan of care discussed, member has follow up appointments scheduled with PCP, Oncologist, A-fib clinic, and Endocrinologist over the next 4 weeks.  This care manager will follow up with member next week as planned, will discuss safety and use of medical alert system.  [name], [place], MSN [place] [phone]

## 2020-04-11 DIAGNOSIS — E11621 Type 2 diabetes mellitus with foot ulcer: Secondary | ICD-10-CM | POA: Diagnosis not present

## 2020-04-11 DIAGNOSIS — Z9181 History of falling: Secondary | ICD-10-CM | POA: Diagnosis not present

## 2020-04-11 DIAGNOSIS — L97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scle: Secondary | ICD-10-CM | POA: Diagnosis not present

## 2020-04-11 DIAGNOSIS — I129 Hypertensive chronic kidney disease with stage 1 through stage 4 chronic kidney disease, or unspecified chronic kidney disease: Secondary | ICD-10-CM | POA: Diagnosis not present

## 2020-04-11 DIAGNOSIS — I480 Paroxysmal atrial fibrillation: Secondary | ICD-10-CM | POA: Diagnosis not present

## 2020-04-11 DIAGNOSIS — R000 Tachycardia, unspecified: Secondary | ICD-10-CM | POA: Diagnosis not present

## 2020-04-11 DIAGNOSIS — Z794 Long term (current) use of insulin: Secondary | ICD-10-CM | POA: Diagnosis not present

## 2020-04-11 DIAGNOSIS — L97512 Non-pressure chronic ulcer of other part of right foot with fat layer exposed: Secondary | ICD-10-CM | POA: Diagnosis not present

## 2020-04-11 DIAGNOSIS — Z87891 Personal history of nicotine dependence: Secondary | ICD-10-CM | POA: Diagnosis not present

## 2020-04-11 DIAGNOSIS — N1831 Chronic kidney disease, stage 3a: Secondary | ICD-10-CM | POA: Diagnosis not present

## 2020-04-11 DIAGNOSIS — E041 Nontoxic single thyroid nodule: Secondary | ICD-10-CM | POA: Diagnosis not present

## 2020-04-11 DIAGNOSIS — E1122 Type 2 diabetes mellitus with diabetic chronic kidney disease: Secondary | ICD-10-CM | POA: Diagnosis not present

## 2020-04-11 DIAGNOSIS — J45909 Unspecified asthma, uncomplicated: Secondary | ICD-10-CM | POA: Diagnosis not present

## 2020-04-11 DIAGNOSIS — G4733 Obstructive sleep apnea (adult) (pediatric): Secondary | ICD-10-CM | POA: Diagnosis not present

## 2020-04-11 DIAGNOSIS — E1142 Type 2 diabetes mellitus with diabetic polyneuropathy: Secondary | ICD-10-CM | POA: Diagnosis not present

## 2020-04-11 DIAGNOSIS — I4892 Unspecified atrial flutter: Secondary | ICD-10-CM | POA: Diagnosis not present

## 2020-04-11 DIAGNOSIS — D61810 Antineoplastic chemotherapy induced pancytopenia: Secondary | ICD-10-CM | POA: Diagnosis not present

## 2020-04-11 DIAGNOSIS — C3491 Malignant neoplasm of unspecified part of right bronchus or lung: Secondary | ICD-10-CM | POA: Diagnosis not present

## 2020-04-15 ENCOUNTER — Other Ambulatory Visit: Payer: Self-pay | Admitting: *Deleted

## 2020-04-15 DIAGNOSIS — N1831 Chronic kidney disease, stage 3a: Secondary | ICD-10-CM | POA: Diagnosis not present

## 2020-04-15 DIAGNOSIS — L97512 Non-pressure chronic ulcer of other part of right foot with fat layer exposed: Secondary | ICD-10-CM | POA: Diagnosis not present

## 2020-04-15 DIAGNOSIS — I480 Paroxysmal atrial fibrillation: Secondary | ICD-10-CM | POA: Diagnosis not present

## 2020-04-15 DIAGNOSIS — E1142 Type 2 diabetes mellitus with diabetic polyneuropathy: Secondary | ICD-10-CM | POA: Diagnosis not present

## 2020-04-15 DIAGNOSIS — Z87891 Personal history of nicotine dependence: Secondary | ICD-10-CM | POA: Diagnosis not present

## 2020-04-15 DIAGNOSIS — Z794 Long term (current) use of insulin: Secondary | ICD-10-CM | POA: Diagnosis not present

## 2020-04-15 DIAGNOSIS — E041 Nontoxic single thyroid nodule: Secondary | ICD-10-CM | POA: Diagnosis not present

## 2020-04-15 DIAGNOSIS — J45909 Unspecified asthma, uncomplicated: Secondary | ICD-10-CM | POA: Diagnosis not present

## 2020-04-15 DIAGNOSIS — R000 Tachycardia, unspecified: Secondary | ICD-10-CM | POA: Diagnosis not present

## 2020-04-15 DIAGNOSIS — L97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scle: Secondary | ICD-10-CM | POA: Diagnosis not present

## 2020-04-15 DIAGNOSIS — E1122 Type 2 diabetes mellitus with diabetic chronic kidney disease: Secondary | ICD-10-CM | POA: Diagnosis not present

## 2020-04-15 DIAGNOSIS — D61810 Antineoplastic chemotherapy induced pancytopenia: Secondary | ICD-10-CM | POA: Diagnosis not present

## 2020-04-15 DIAGNOSIS — Z9181 History of falling: Secondary | ICD-10-CM | POA: Diagnosis not present

## 2020-04-15 DIAGNOSIS — G4733 Obstructive sleep apnea (adult) (pediatric): Secondary | ICD-10-CM | POA: Diagnosis not present

## 2020-04-15 DIAGNOSIS — E11621 Type 2 diabetes mellitus with foot ulcer: Secondary | ICD-10-CM | POA: Diagnosis not present

## 2020-04-15 DIAGNOSIS — I4892 Unspecified atrial flutter: Secondary | ICD-10-CM | POA: Diagnosis not present

## 2020-04-15 DIAGNOSIS — I129 Hypertensive chronic kidney disease with stage 1 through stage 4 chronic kidney disease, or unspecified chronic kidney disease: Secondary | ICD-10-CM | POA: Diagnosis not present

## 2020-04-15 DIAGNOSIS — C3491 Malignant neoplasm of unspecified part of right bronchus or lung: Secondary | ICD-10-CM | POA: Diagnosis not present

## 2020-04-15 NOTE — Patient Outreach (Signed)
[name]) Care Management  04/15/2020  [name] 06-Aug-1945 [id number]   Call placed to member to follow up on ongoing management of health conditions.  State he is "doing real good."  Report his foot is almost healed, will follow up with wound care on Friday, home health remain active as well.  Has been monitoring HR and BP, range 90-100 and 110's/60's.  Denies any chest discomfort or shortness of breath.  Scheduled for cardiology follow up on 1/13 as well as radiology oncologist follow up the same day.  Was seen by oncologist on 12/27, plan to repeat chest CT and follow up in office in 3 months (3/28).  Denies any urgent concerns, encouraged to contact this care manager with questions.  Agrees to follow up within the next month.  Goals Addressed            This Visit's Progress   . [facility] - Careful Skin Care   On track    Follow Up Date - 04/15/2020  Timeframe:  Short-Term Goal Priority:  Medium Start Date:       04/01/2020                      Expected End Date:   05/02/2020                      - clean and dry skin well - use lotion that is suggested by the doctor or nurse    Why is this important?   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   Taking really good care of your skin will help to keep your skin unbroken.    Notes:   11/3 - Wound still in the process of healing  12/7 - Reviewed ongoing wound healing and dressing changes, remain with home health involved  12/21 - Confirmed still active with wound care center and home health for wound care    . [facility] - Make and Keep All Appointments   On track    Follow Up Date 04/15/2020  Timeframe:  Short-Term Goal Priority:  Medium Start Date:         04/01/2020                    Expected End Date:      05/02/2020                   - call to cancel if needed - keep a calendar with appointment dates    Why is this important?   Part of staying healthy is seeing the doctor for follow-up  care.  If you forget your appointments, there are some things you can do to stay on track.    Notes:   Continues with cancer treatment appointments  12/7 - Upcoming appointments reviewed, assessed need for transportation assistance, none needed  12/21 - Reviewed appointment calender, encouraged to call oncology to schedule office visit    . [facility] - Manage Fatigue (Tiredness)   On track    Follow Up Date 1/30   - develop a new routine to improve sleep - eat healthy - maintain healthy weight    Why is this important?   Cancer treatment and its side effects can drain your energy. It can keep you from doing things you would like to do.  There are many things that you can do to manage fatigue.    Notes:  Re-educated on slowly increasing activity to decrease fatigue  12/7 - Discussed overall increasing activity and developing exercise routine    . [facility] - Track and Manage Heart Rate and Rhythm-Atrial Fibrillation   On track    Timeframe:  Short-Term Goal Priority:  Medium Start Date:  03/18/2020                          Expected End Date:      04/18/2020                    - check pulse (heart) rate before taking medicine - check pulse (heart) rate once a day - make a plan to exercise regularly - keep all lab appointments - take medicine as prescribed    Why is this important?    Atrial fibrillation may have no symptoms. Sometimes the symptoms get worse or happen more often.   It is important to keep track of what your symptoms are and when they happen.   A change in symptoms is important to discuss with your doctor or nurse.   Being active and healthy eating will also help you manage your heart condition.     Notes:   12/21 - Reminded to keep log of BP and HR daily      [name], [place], MSN [place] Manager [phone]

## 2020-04-18 ENCOUNTER — Encounter: Payer: Medicare Other | Attending: Physician Assistant | Admitting: Physician Assistant

## 2020-04-18 ENCOUNTER — Telehealth: Payer: Self-pay

## 2020-04-18 ENCOUNTER — Other Ambulatory Visit: Payer: Self-pay

## 2020-04-18 DIAGNOSIS — M21372 Foot drop, left foot: Secondary | ICD-10-CM | POA: Diagnosis not present

## 2020-04-18 DIAGNOSIS — T8189XA Other complications of procedures, not elsewhere classified, initial encounter: Secondary | ICD-10-CM | POA: Diagnosis not present

## 2020-04-18 DIAGNOSIS — E11621 Type 2 diabetes mellitus with foot ulcer: Secondary | ICD-10-CM | POA: Diagnosis not present

## 2020-04-18 DIAGNOSIS — Z794 Long term (current) use of insulin: Secondary | ICD-10-CM | POA: Diagnosis not present

## 2020-04-18 DIAGNOSIS — C3490 Malignant neoplasm of unspecified part of unspecified bronchus or lung: Secondary | ICD-10-CM | POA: Insufficient documentation

## 2020-04-18 DIAGNOSIS — L89890 Pressure ulcer of other site, unstageable: Secondary | ICD-10-CM | POA: Diagnosis not present

## 2020-04-18 DIAGNOSIS — L89899 Pressure ulcer of other site, unspecified stage: Secondary | ICD-10-CM | POA: Insufficient documentation

## 2020-04-18 DIAGNOSIS — L97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scle: Secondary | ICD-10-CM | POA: Insufficient documentation

## 2020-04-18 DIAGNOSIS — E1142 Type 2 diabetes mellitus with diabetic polyneuropathy: Secondary | ICD-10-CM | POA: Diagnosis not present

## 2020-04-18 NOTE — Progress Notes (Addendum)
LIONARDO, HAZE ([id number]) Visit Report for 04/18/2020 Chief Complaint Document Details Patient Name: Dustin, [name] Date of Service: 04/18/2020 12:30 PM Medical Record Number: [id number] Patient Account Number: [id number] Date of Birth/Sex: 27-Jun-1945 (75 y.o. M) Treating RN: Cornell Barman Primary Care Provider: [name] Other Clinician: Referring Provider: [name] Treating Provider/Extender: Skipper Cliche in Treatment: 19 Information Obtained from: Patient Chief Complaint Left plantar foot ulcer s/p surgical debridement and wound VAC placement. Left great toe ulcer. Electronic Signature(s) Signed: 04/18/2020 12:54:50 PM By: Worthy Keeler [name] Entered By: Worthy Keeler on 04/18/2020 12:54:50 [name] ([id number]) -------------------------------------------------------------------------------- HPI Details Patient Name: [name] Date of Service: 04/18/2020 12:30 PM Medical Record Number: [id number] Patient Account Number: [id number] Date of Birth/Sex: [birth year] (74 y.o. M) Treating RN: Cornell Barman Primary Care Provider: [name] Other Clinician: Referring Provider: [name] Treating Provider/Extender: Skipper Cliche in Treatment: 19 History of Present Illness HPI Description: 10/04/16 on evaluation today patient presents for initial evaluation concerning a laceration of the right wrist and hand on the bowler aspect. He tells me that he fell into glass following a medication change in regard to his diabetes. He initially went to [facility] where she was sutured and subsequently his daughter who is a Marine scientist removed the teachers at the appropriate time. Unfortunately the wound has done poorly and dehissed which has caused him some trouble including infection. He has been on antibiotics both topically and orally though the wound continues to show signs of necrotic tissue according to notes which is what he was sent to [place] for  evaluation concerning. He does have diabetes and unfortunately this is severely uncontrolled. He does see an endocrinologist and is working on this. 10/11/2016 -- the patient's notes from the ER were reviewed and he had his suturing done on 09/17/2016 and had necrotic skin flaps which had to be trimmed the last visit he was here. He has managed to get his Santyl ointment and has been using this appropriately. 10/21/16 patient's wound appears to be doing very well on evaluation today. He still has some Slough covering the wound bed but this is nowhere near as severe as when i saw this initially two weeks ago nor even my review of his pictures last week. I'm pleased with how this is progressing. Patient's wound over the right form appears to be doing fairly well on evaluation today. There is no evidence of infection and he has a small area at the crease of where she is wrist extends and flexes that is still open and appears to be having difficulty due to the fact that it is cracking open. I think this is something that may be controlled with moisture control that is keeping the wound a little bit more moist. Nonetheless this is a tiny region compared to the initial wound and overall appears to be doing very well. No fevers, chills, nausea, or vomiting noted at this time. 11/25/16 on evaluation today patient appears to be doing well in regard to his right wrist ulcer. In fact this appears to be completely healed he is having no discomfort. He is pleased with how this is progressed. Readmission: 06/24/17 on evaluation today patient appears back in our clinic regarding issues that he has been having with his right ankle on the lateral malleolus as well is in the dorsal surface of his left foot. Both have been going on for several weeks unfortunately. He tells me that initially this was being managed  by Dr. Edilia Bo and I did review the notes from Dr. Edilia Bo in epic. Patient was placed on amoxicillin  initially and then subsequently Keflex following. He has been using triple antibiotic ointment over-the-counter along with a Band-Aid for the wound currently. He did not have any Santyl remaining. His most recent hemoglobin A1c which was just two days ago was 10.3. Fortunately he seems to have excellent blood flow in the bilateral lower extremities with his left ABI being 0.95 in the right ABI 1.01. Patient does have some pain in regard to each of these ulcerations but he tells me it's not nearly as severe as the hand wound that I help treat earlier over the summer. This was in 2018. 07/15/17 on evaluation today patient's lower surety ulcer is actually both appear to be doing fairly well at this point. He does have a little bit of skin overlapping the ulcer on the right lateral ankle this was debrided away today just with saline and gauze without complication patient had no discomfort or pain. Otherwise patient's left foot ulcer does appear to be showing signs of improvement he does seem to have a right great toe. Nikki and noted at this point in time. I think that this does need to be managed at this point. No fevers, chills, nausea, or vomiting noted at this time. 07/22/17 on evaluation today patient's wounds in regard to his lower extremities appear to be doing somewhat better. His right ankle wound in fact appears to likely be completely closed. The left dorsal foot ulcer though still open does appear to be a little bit smaller and does also seem to be making good progress. He has not been having problems with the treatment at this point in the general he does not seem to show as much erythema surrounding the wound bed which is good news. He is not having significant discomfort. 08/12/17 on evaluation today patients wounds of both appeared to be doing better in fact they both appear to be completely healed. Overall I'm very pleased with how things have progressed over the past week even more than what  I expected. Obviously this is good news and he is happy about this. He did have a fall when going to his orthopedic doctor recently fortunately he does not appear to have injured anything severely but nonetheless does have a couple of bumps and bruises nothing significant at this time but he needs to be seen and wound care for. He may have broken his left wrist he is following up with orthopedics in that regard. Readmission: 10/24/17 on evaluation today patient presents for reevaluation after having been discharged Aug 12, 2017. His wound was healed at that point although he states that it has been somewhat discolored since that time. He has not experienced the drainage as far as the wound is concerned but the fact that it was still discolored been greater than two months after the fact made him want to come in for reevaluation. Upon inspection today the patient has no pain although he does have neuropathy. He does have a discolored region of the dorsal surface of the left foot which was the site where the ulcer was that we previously treated. The good news is he does not again have any discomfort although obviously I do believe this may be some scar tissue and just discoloration in that regard. Again he hasn't had any drainage or discharge since I last saw him and at this point I see no evidence of that  either this area shows complete epithelialization. In general I do not feel like there's any significant issue at this point in that regard. READMISSION 02/09/2019 This is a 75 year old man with type 2 diabetes poorly controlled with significant peripheral neuropathy. He has been in this clinic before in 2018 with a laceration on his right hand and then again in 2019 with an area over the right lateral malleolus. 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 He states that the wound is been there for the last week  or [name], RACKLEY. ([id number]) 2. It is been uncomfortable to walk on. The man is an active man having to look after an elderly mother. Past medical history, type 2 diabetes with polyneuropathy with a recent hemoglobin A1c in September 2011 0.3, lumbar disc disease, hypergonadism., Left foot drop and gout. We did not repeat his ABIs in the clinic quoting values from July 2019 of 0.95 and 1.01. He is not felt to have significant PAD 02/19/2019 on evaluation today patient actually appears to be doing better with regard to his foot wound based on what I am seeing currently. He was seen by Dr. Dellia Nims on readmission back into the clinic last week when I was off. The good news is the patient has been tolerating the dressing changes without any complication in fact has not really had much in the way of drainage. He does have an AFO brace which she believes is actually pushing on this area as well and has contributed to the issue. Fortunately there is no signs of active infection at this point. His ABIs appear to be well. 03/05/2019 on evaluation today patient appears to be doing well in regard to his foot ulcer. He has been tolerating the dressing changes without complication. Fortunately there is no signs of active infection. The wound is measuring somewhat smaller and overall I feel like he is making good progress at this point. 03/12/2019 on evaluation today patient actually appears to be doing quite well with regard to his foot ulcer. The wound is looking better the measurement is not significantly better at this point unfortunately but nonetheless again the overall appearance is improving I think he is making some progress. I still think the big issue here is foot drop and again the patient wants to see if potentially he could get a brace to help with this. Subsequently I think that we can definitely make a referral to [facility] to see if they can help [place] in this regard. He is in agreement with that  plan. 03/26/2019 on evaluation today patient appears to be doing more poorly in regard to his foot ulcer at this time. He did see podiatry in order to see about getting a brace but unfortunately he tells me that Medicare is not likely to pay for one for him as he had 1 I guess 8 months ago. However this one that apparently goes in his shoes that does not seem to be working out well for him. Unfortunately there are signs of active infection at this time. No fevers, chills, nausea, vomiting, or diarrhea. Systemically. The patient has erythema on the dorsal surface of his foot that tracks from the lateral portion of his foot and again he does have a blister around the wound as well which is worse compared to previous. Overall I am concerned. 04/02/2019 on evaluation today patient appears to be doing about  the same with regard to his wound. Unfortunately there is no signs of significant improvement although I do think there is no signs of infection which is good news. His drainage is also slowed down tremendously. I think at this point he would benefit from the initiation of a total contact cast. The only concern I have is that over his balance. I explained to the patient that if we were to initiate the cast I think it would be greatly beneficial for him but he would need to ensure not to walk at all unless he uses a walker he actually has 2 walkers one for inside his home and one that he keeps out in the car. For that reason he would be covered in that regard. I just do not want him to have any accidents and fall with the cast. 12/28-Patient comes in 1 week out a day early for his appointment he was in the hospital for 2 days for uncontrolled hyperglycemia and required management of high blood sugars, with some adjustments in his home regimen of insulin, patient stubbed his right great toe today and put a Band-Aid on it, he is here for his left lateral foot ulcer and we are using silver alginate  dressing, he was intact total contact cast with difficulty with balance to the extent that that had to be discontinued 04/16/2019 on evaluation today patient appears to be doing better with regard to the overall appearance of his wound size. This is good news. We had attempted a total contact cast but unfortunately he had some falling issues that the family contacted [place] about we took the cast off we did not put it back on. Nonetheless it ended up that just a couple days later he was in the hospital due to elevations in his blood sugar which went to around 500 and even a little higher. Subsequently he was admitted to the hospital and was in the hospital for a total of 3 days and that included Christmas day. Fortunately there got his blood sugar under control he tells me that this is running about 200 now which is much better news. There does not appear to be any signs of active infection at this time. No fevers, chills, nausea, vomiting, or diarrhea. 04/30/2019 on evaluation today patient actually appears to be doing slightly worse compared to previous as far as the overall size of his wound is concerned. There also is some need for sharp debridement today as well. Fortunately there is no evidence of active infection which is good news. No fevers, chills, nausea, vomiting, or diarrhea. 05/07/2019 upon evaluation today patient appears to be doing excellent in regard to his plantar foot ulcer. He has been tolerating the dressing changes without complication. Fortunately there is no signs of active infection at this time. No fevers, chills, nausea, vomiting, or diarrhea. He does tell me that he actually got in touch with [employer] and they feel like they can add diagnosis codes to his orders in order to get an external brace for him as opposed to the one that goes internal to issue. That would be excellent for his foot drop and I did tell him that if there is anything I need to fill out in that regard I will be  more than happy to oblige and fill out what is needed for [employer]. 05/14/2019 upon evaluation today patient's original wound site actually appears to be doing much better which is good news. Fortunately there is no signs of active infection  at this time. No fevers, chills, nausea, vomiting, or diarrhea. With that being said the patient unfortunately is continuing to have issues with rubbing he tells me that the felt slid on his foot although he did not realize it until today. Nonetheless I think this did cause a blister around the edge of the wound and he does an open wound that is slightly larger overall in measurement compared to last time mainly due to this blister. Other than that I feel like he is doing quite well. 05/21/2019 upon evaluation today patient appears to be doing excellent in regard to his wound at this time on the plantar foot. He is showing signs of improvement he does have some slough and biofilm noted on the surface of the wound this is going require some sharp debridement today as well. Fortunately there is no evidence of active infection. No fevers, chills, nausea, vomiting, or diarrhea. 05/28/2019 on evaluation today patient's wound actually appears to be measuring smaller upon initial inspection and I do believe that it is doing well. With that being said he continues to have a blister on the outside/lateral edge of the wound that occurs as a result of rubbing due to his dropfoot currently. Subsequently we have filled out the paperwork for his new external brace but again that does take several weeks to get that completed once everything is returned to back we did send this back last week as far as the paperwork was concerned. With that being said there does not appear to be any signs of active infection at this time. 06/04/2019 on evaluation today patient's wound actually appears to be doing slightly better compared to last week which is good news. He still states that his  insurance unfortunately does not seem to want to cover any of his new brace. He is going to just pay for this himself. Nonetheless he is going by there today when he leaves here that is [employer] in [place] that is going to be making this brace for him. 06/18/2019 upon evaluation today patient fortunately does have his new brace which is the external AFO brace with bilateral uprights. Subsequently he seems to be doing much better in fact the wound already appears better. He has not even been using the felt he is just using a dressing over Katie ([id number]) top of the wound and overall I am very pleased with how things seem to be progressing. There is no signs of active infection at this time which is great news and I think that the AFO is good to help him tremendously as far as trying to keep pressure off of the 06/25/2019 upon evaluation today patient seems to be making good progress in regard to his wound. He has been tolerating the dressing changes without complication. Fortunately there is no signs of active infection at this time. No fevers, chills, nausea, vomiting, or diarrhea. 07/02/2019 upon evaluation today patient unfortunately had a blistered area on the bottom of his foot tracking more towards the midline of the plantar aspect. He does not seem to have issues with rubbing more laterally that he used to have but nonetheless this is still giving him some trouble and causing problems here. Fortunately there is no signs of active infection at this time. No fevers, chills, nausea, vomiting, or diarrhea. 07/09/2019 upon evaluation today patient appears to be doing well with regard to his plantar foot ulcer. Definitely this is much improved compared to last week's visit which again last week the  issue there was that he had an area of callus buildup on the plantar aspect of his foot I was able to clean this away and after effectively doing so the wound seems to be doing much better which  is great news. There is no signs of active infection at this time. 07/16/2019 upon evaluation today patient appears to be making some progress in my opinion with regard to his plantar foot ulcer. He has been tolerating the dressing changes without complication. Fortunately there is no evidence of active infection at this time which is great news. 07/23/2019 upon evaluation today patient appears to be doing well with regard to his wounds on the plantar foot. He has been tolerating the dressing changes without complication. These do seem to be healing. 08/06/2019 upon evaluation today patient's wound actually appears to be showing some signs of improvement though he still has a significant amount of callus buildup over this location. That is can require some debridement today. With that being said the actual wound openings seem to be dramatically improved which is great news. There is no signs of active infection at this time. No fevers, chills, nausea, vomiting, or diarrhea. 08/13/2019 upon evaluation today patient appears to be doing well with regard to his foot ulcer. He has been tolerating the dressing changes without complication. Fortunately there is no signs of active infection at this time. No fevers, chills, nausea, vomiting, or diarrhea. 08/20/2019 upon evaluation today patient appears to be doing excellent in regard to his foot ulcer. I do believe he still showing signs of improvement this is very slow but again I think still pressure getting to the area even with his brace and custom shoe is still part of the main issue with the delay here. Nonetheless I do not see any signs of worsening or infection all of which is good news. 5/20; left lateral foot wound. He wears an AFO brace for foot drop related to his diabetes. The wound is small and clean looking he is using silver collagen. We have apparently tried him in offloading shoe he could not tolerate this. He does not have good balance. 09/11/2019  upon evaluation today patient appears to be doing more poorly in regard to his foot ulcer as compared to last time I saw him. Unfortunately he does tell me as we discussed on Friday that he had pus and blood coming from the area underneath the callus. Unfortunately I think that this is a combination of the callus not being trimmed away enough and potentially the felt pad causing an abnormal rubbing which has happened before which led to this becoming more irritated and likely infected based on what I am seeing today. The patient takes ampicillin all the time. Subsequently he also states that he has been trying for a couple of days to take the clindamycin of which she had some leftover from previous. With that being said it upset his stomach. The antibiotic that I sent in for him on Friday unfortunately I wrote the wrong number on as far as the number of pills that he needed. With that being said I corrected that this morning but he tells me is not can pick it up as that seems to be making him have trouble with his stomach being upset at this point. Fortunately there is no signs of active infection at this time systemically. 09/17/2019 upon evaluation today patient appears to be doing better in regard to his foot ulcer. Has been taking ampicillin that he had  leftover from a previous prescription. With that being said he did go see his doctor today concerning his diabetes and notes that his hemoglobin A1c was 13 obviously this is elevated and there can be working on that. Also did review the culture result that we finally got back for final well that showed rare Pseudomonas and a few group B strep noted. The Pseudomonas was sensitive to Cipro but again the wound is doing so much better with the ampicillin I think this is probably not a causative organism I do not see anything that really has me concerned as far as infection is concerned today. Therefore I would hold off on prescribing Cipro for the patient  and have him discontinue the ampicillin until completion. 09/24/2019 upon evaluation today patient appears to be doing 09/24/2019 upon evaluation today patient appears to be doing well with regard to his foot ulcer. He actually has not enlarged any and seems to be measuring smaller quite significantly at this point. His toe ulcer is also doing somewhat better which is good news. Fortunately there is no evidence of active infection at this time. No fevers, chills, nausea, vomiting, or diarrhea. 10/08/2019 upon evaluation today patient appears to be doing well with regard to his foot ulcer all things considered. Fortunately there is no signs of active infection at this time. No fevers, chills, nausea, vomiting, or diarrhea. Fortunately he states that overall he has been doing much better with regard to not have any blistering on the side of his foot. Unfortunately he did have a fall last night due to the fact that he got up to walk without his brace on and did not have his walker either it was around 4 in the morning and his mother was calling out. Subsequently he states that the foot drop got him and he ended up following. Nonetheless he states he normally uses his walker I explained he needs to try to make sure not to ever go without the walker. No matter how attempting the situation may be. Readmission: 12/03/2019 upon evaluation today patient presents for follow-up after having had surgery last week on his foot for surgical debridement and then subsequent wound VAC placement. He had gone into the hospital as he was having a significant infection and not feeling well. He is unfortunately since I last saw him also been under the care of the cancer doctors and they have been taking good care of him. He does seem to be in fairly high spirits all things considered. With that being said he has lung cancer he tells me that his back from the radiation feels like it is constantly burned. 12/10/2019 on evaluation  today patient appears to be doing well with regard to his wound. I do believe the wound VAC is doing a great job at helping to manage his drainage and overall health and to granulate in the wound. He has less undermining noted today in fact almost none. Home health is coming out to change the wound VAC on a regular basis Monday, Wednesday, and Friday. 12/24/2019 on evaluation today patient appears to be doing fairly well in regard to the wound on the plantar aspect of his foot. With that being said he does have a significant feeling in of what has been going on with regard to the ulceration which does not have the depth that it did in the past. With that being said I do believe that based on what I am seeing at this point the patient likely  does need to take a break from the wound VAC and in fact it appears the wound VAC was actually placed directly on the skin as far as the bridging was concerned causing some irritation of the top of the patient's foot. This has me concerned as well that if were not careful that could worsen. I do believe a break with the wound VAC however will be fine and then we can see where we go from there. [name], [name] ([id number]) 01/07/2020 upon evaluation today patient actually appears to be doing much better in regard to his wound. He has been tolerating the dressing changes without complication. There is no signs of active infection and overall I feel like he is managing quite nicely. I do believe that we would likely discontinue the [facility] altogether today. 10/13; follow-up visit. Patient has a area on the left lateral plantar foot. He has been using Hydrofera Blue and bordered foam he has home health changing this 3 times a week. He is nonambulatory. He avoids pushing the wheelchair with that part of his foot and instead uses his heel. We have had a nice improvement today and overall surface area 10/27; 2-week follow-up. He has been using Hydrofera Blue and bordered foam  on a wound on the left lateral foot. He told me he was nonambulatory last time from foot drop although he walked in the clinic with a walker this week apparently did not have any transportation. Also he has a new wound on the right dorsal medial foot which she says was caused by foot where trauma 11/10; 2-week follow-up. We have been using Hydrofera Blue on the left lateral foot roughly at the base of the fifth metatarsal. He came in last visit with a punched out area on the right dorsal foot. He says this was foot where trauma. I reiterated that he is not wearing his previous braces and he is in. He spends most of his time in his wheelchair. He says he is careful not to be pushing himself around in the wheelchair with his feet. He is wearing regular tennis shoes. I taken a bit of time to review the history of the wound on the left foot. This actually was a chronic wound for him. Episodic infections. He had arterial studies done in August these were normal. ABI in the right of 1.05, TBI at 0.65 with triphasic waveforms on the left 1.01 with a TBI of 0.78 and again triphasic waveforms. Does not appear to be a arterial issue 11/17 we brought the patient in today to look at the area on the right dorsal foot and the left lateral plantar foot. The left was the original wound. Patient states the right dorsal foot happened from trauma from his brace. He is no longer using this. He is mostly in surgical shoes that we provide except when he goes out driving. We changed him to silver collagen last week. He does not have an arterial issue He came to the clinic today got out of his car went to the back of his car felt presyncopal and fell down. When we got out there to help him up he was alert conversational. Blood pressure little reduced at 237 systolic: We got him in the exam room. Otherwise he looks fine 03/21/2020 upon evaluation today patient appears to be doing decently well in regard to his ulcers on the  bilateral feet. Fortunately there is no signs of active infection at this time which is great news. No fever chills  noted. He has been tolerating the dressing changes without complication. Fortunately there is no sign of active infection at this time. No fevers, chills, nausea, vomiting, or diarrhea. 04/04/2020 upon evaluation today patient's wounds actually are showing signs of improvement at both locations. There is good be some sharp debridement necessary on the plantar foot but overall not much compared to where things have been in the past. Overall I feel like he is doing quite well 04/18/2020 on evaluation today patient appears to be doing well with regard to his wounds currently. Fortunately there is no signs of active infection at this time. No fevers, chills, nausea, vomiting, or diarrhea. With that being said the patient is continuing to experience some issues with open wound on the right foot on the left though this is not draining it does appear to show some signs of callus currently. Electronic Signature(s) Signed: 04/18/2020 1:09:38 PM By: Worthy Keeler [name] Entered By: Worthy Keeler on 04/18/2020 13:09:38 [name] ([id number]) -------------------------------------------------------------------------------- Callus Pairing Details Patient Name: [name] Date of Service: 04/18/2020 12:30 PM Medical Record Number: [id number] Patient Account Number: [id number] Date of Birth/Sex: 02/27/1946 (74 y.o. M) Treating RN: Cornell Barman Primary Care Provider: [name] Other Clinician: Referring Provider: [name] Treating Provider/Extender: Skipper Cliche in Treatment: 19 Procedure Performed for: Wound #7 Left,Lateral,Plantar Foot Performed By: Physician Tommie Sams., [name] Post Procedure Diagnosis Same as Pre-procedure Electronic Signature(s) Signed: 04/18/2020 5:26:41 PM By: Gretta Cool, BSN, RN, CWS, Kim RN, BSN Entered By: Gretta Cool, BSN, RN, CWS, Kim on  04/18/2020 13:08:23 [name] ([id number]) -------------------------------------------------------------------------------- Physical Exam Details Patient Name: [name] Date of Service: 04/18/2020 12:30 PM Medical Record Number: [id number] Patient Account Number: [id number] Date of Birth/Sex: [birth year] (74 y.o. M) Treating RN: Cornell Barman Primary Care Provider: [name] Other Clinician: Referring Provider: [name] Treating Provider/Extender: Skipper Cliche in Treatment: 20 Constitutional Well-nourished and well-hydrated in no acute distress. Respiratory normal breathing without difficulty. Psychiatric this patient is able to make decisions and demonstrates good insight into disease process. Alert and Oriented x 3. pleasant and cooperative. Notes Upon inspection patient's wound bed actually showed signs of good granulation at this time on the right foot. On the left foot he did have some callus noted I did remove this today and underneath the callus and minimal eschar there was a pinpoint area still open but again this is all but closed. I'm actually very pleased with where things stand today this has been managed here in the clinic and otherwise for quite some time. Electronic Signature(s) Signed: 04/18/2020 1:09:06 PM By: Worthy Keeler [name] Entered By: Worthy Keeler on 04/18/2020 13:09:06 [name] ([id number]) -------------------------------------------------------------------------------- Physician Orders Details Patient Name: [name] Date of Service: 04/18/2020 12:30 PM Medical Record Number: [id number] Patient Account Number: [id number] Date of Birth/Sex: 1945/10/27 (74 y.o. M) Treating RN: Cornell Barman Primary Care Provider: [name] Other Clinician: Referring Provider: [name] Treating Provider/Extender: Skipper Cliche in Treatment: 65 Verbal / Phone Orders: No Diagnosis Coding ICD-10 Coding Code  Description E11.621 Type 2 diabetes mellitus with foot ulcer L97.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scle L97.512 Non-pressure chronic ulcer of other part of right foot with fat layer exposed E11.42 Type 2 diabetes mellitus with diabetic polyneuropathy M21.372 Foot drop, left foot Follow-up Appointments o Return Appointment in 2 weeks. Bathing/ Shower/ Hygiene o May shower with wound dressing protected with water repellent cover or  cast protector. Edema Control - Lymphedema / Segmental Compressive Device / Other o Elevate, Exercise Daily and Avoid Standing for Long Periods of Time. Wound Treatment Wound #7 - Foot Wound Laterality: Plantar, Left, Lateral Secondary Dressing: Bordered Gauze Sterile-HBD 4x4 (in/in) Discharge Instructions: Cover wound with Bordered Guaze Sterile as directed Wound #9 - Foot Wound Laterality: Dorsal, Right Primary Dressing: Prisma 4.34 (in) Discharge Instructions: Moisten w/normal saline or sterile water; Cover wound as directed. Do not remove from wound bed. Secondary Dressing: Bordered Gauze Sterile-HBD 4x4 (in/in) Discharge Instructions: Cover wound with Bordered Guaze Sterile as directed Electronic Signature(s) Signed: 04/18/2020 4:17:09 PM By: Worthy Keeler [name] Signed: 04/18/2020 5:26:41 PM By: Gretta Cool, BSN, RN, CWS, Kim RN, BSN Entered By: Gretta Cool, BSN, RN, CWS, Kim on 04/18/2020 13:10:09 [name] ([id number]) -------------------------------------------------------------------------------- Problem List Details Patient Name: [name] Date of Service: 04/18/2020 12:30 PM Medical Record Number: [id number] Patient Account Number: [id number] Date of Birth/Sex: October 05, 1945 (74 y.o. 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 12/03/2019 No Yes muscle L97.512 Non-pressure chronic ulcer of other part of right foot with fat layer 02/06/2020 No Yes exposed E11.42 Type 2 diabetes mellitus with diabetic polyneuropathy 12/03/2019 No Yes M21.372 Foot drop, left foot 12/03/2019 No Yes Inactive Problems Resolved Problems Electronic Signature(s) Signed: 04/18/2020 12:54:38 PM By: Worthy Keeler [name] Entered By: Worthy Keeler on 04/18/2020 12:54:38 [name] ([id number]) -------------------------------------------------------------------------------- Progress Note Details Patient Name: [name] Date of Service: 04/18/2020 12:30 PM Medical Record Number: [id number] Patient Account Number: [id number] Date of Birth/Sex: 1946-02-22 (74 y.o. M) Treating RN: Cornell Barman Primary Care Provider: [name] Other Clinician: Referring Provider: [name] Treating Provider/Extender: Skipper Cliche in Treatment: 19 Subjective Chief Complaint Information obtained from Patient Left plantar foot ulcer s/p surgical debridement and wound VAC placement. Left great toe ulcer. History of Present Illness (HPI) 10/04/16 on evaluation today patient presents for initial evaluation concerning a laceration of the right wrist and hand on the bowler aspect. He tells me that he fell into glass following a medication change in regard to his diabetes. He initially went to [facility] where she was sutured and subsequently his daughter who is a Marine scientist removed the teachers at the appropriate time. Unfortunately the wound has done poorly and dehissed which has caused him some trouble including infection. He has been on antibiotics both topically and orally though the wound continues to show signs of necrotic  tissue according to notes which is what he was sent to [place] for evaluation concerning. He does have diabetes and unfortunately this is severely uncontrolled. He does see an endocrinologist and is working on this. 10/11/2016 -- the patient's notes from the ER were reviewed and he had his suturing done on 09/17/2016 and had necrotic skin flaps which had to be trimmed the last visit he was here. He has managed to get his Santyl ointment and has been using this appropriately. 10/21/16 patient's wound appears to be doing very well on evaluation today. He still has some Slough covering the wound bed but this is nowhere near as severe as when i saw this initially two weeks ago nor even my review of his pictures last  week. I'm pleased with how this is progressing. Patient's wound over the right form appears to be doing fairly well on evaluation today. There is no evidence of infection and he has a small area at the crease of where she is wrist extends and flexes that is still open and appears to be having difficulty due to the fact that it is cracking open. I think this is something that may be controlled with moisture control that is keeping the wound a little bit more moist. Nonetheless this is a tiny region compared to the initial wound and overall appears to be doing very well. No fevers, chills, nausea, or vomiting noted at this time. 11/25/16 on evaluation today patient appears to be doing well in regard to his right wrist ulcer. In fact this appears to be completely healed he is having no discomfort. He is pleased with how this is progressed. Readmission: 06/24/17 on evaluation today patient appears back in our clinic regarding issues that he has been having with his right ankle on the lateral malleolus as well is in the dorsal surface of his left foot. Both have been going on for several weeks unfortunately. He tells me that initially this was being managed by Dr. Edilia Bo and I did review the notes from  Dr. Edilia Bo in epic. Patient was placed on amoxicillin initially and then subsequently Keflex following. He has been using triple antibiotic ointment over-the-counter along with a Band-Aid for the wound currently. He did not have any Santyl remaining. His most recent hemoglobin A1c which was just two days ago was 10.3. Fortunately he seems to have excellent blood flow in the bilateral lower extremities with his left ABI being 0.95 in the right ABI 1.01. Patient does have some pain in regard to each of these ulcerations but he tells me it's not nearly as severe as the hand wound that I help treat earlier over the summer. This was in 2018. 07/15/17 on evaluation today patient's lower surety ulcer is actually both appear to be doing fairly well at this point. He does have a little bit of skin overlapping the ulcer on the right lateral ankle this was debrided away today just with saline and gauze without complication patient had no discomfort or pain. Otherwise patient's left foot ulcer does appear to be showing signs of improvement he does seem to have a right great toe. Nikki and noted at this point in time. I think that this does need to be managed at this point. No fevers, chills, nausea, or vomiting noted at this time. 07/22/17 on evaluation today patient's wounds in regard to his lower extremities appear to be doing somewhat better. His right ankle wound in fact appears to likely be completely closed. The left dorsal foot ulcer though still open does appear to be a little bit smaller and does also seem to be making good progress. He has not been having problems with the treatment at this point in the general he does not seem to show as much erythema surrounding the wound bed which is good news. He is not having significant discomfort. 08/12/17 on evaluation today patients wounds of both appeared to be doing better in fact they both appear to be completely healed. Overall I'm very pleased with how things  have progressed over the past week even more than what I expected. Obviously this is good news and he is happy about this. He did have a fall when going to his orthopedic doctor recently fortunately he does not  appear to have injured anything severely but nonetheless does have a couple of bumps and bruises nothing significant at this time but he needs to be seen and wound care for. He may have broken his left wrist he is following up with orthopedics in that regard. Readmission: 10/24/17 on evaluation today patient presents for reevaluation after having been discharged Aug 12, 2017. His wound was healed at that point although he states that it has been somewhat discolored since that time. He has not experienced the drainage as far as the wound is concerned but the fact that it was still discolored been greater than two months after the fact made him want to come in for reevaluation. Upon inspection today the patient has no pain although he does have neuropathy. He does have a discolored region of the dorsal surface of the left foot which was the site where the ulcer was that we previously treated. The good news is he does not again have any discomfort although obviously I do believe this may be some scar tissue and just discoloration in that regard. Again he hasn't had any drainage or discharge since I last saw him and at this point I see no evidence of that either this area shows complete epithelialization. In general I do not feel like there's any significant issue at this point in that regard. [name], [name] ([id number]) 02/09/2019 This is a 75 year old man with type 2 diabetes poorly controlled with significant peripheral neuropathy. He has been in this clinic before in 2018 with a laceration on his right hand and then again in 2019 with an area over the right lateral malleolus. 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 He states that the wound is been there for the last week or 2. It is been uncomfortable to walk on. The man is an active man having to look after an elderly mother. Past medical history, type 2 diabetes with polyneuropathy with a recent hemoglobin A1c in September 2011 0.3, lumbar disc disease, hypergonadism., Left foot drop and gout. We did not repeat his ABIs in the clinic quoting values from July 2019 of 0.95 and 1.01. He is not felt to have significant PAD 02/19/2019 on evaluation today patient actually appears to be doing better with regard to his foot wound based on what I am seeing currently. He was seen by Dr. Dellia Nims on readmission back into the clinic last week when I was off. The good news is the patient has been tolerating the dressing changes without any complication in fact has not really had much in the way of drainage. He does have an AFO brace which she believes is actually pushing on this area as well and has contributed to the issue. Fortunately there is no signs of active infection at this point. His ABIs appear to be well. 03/05/2019 on evaluation today patient appears to be doing well in regard to his foot ulcer. He has been tolerating the dressing changes without complication. Fortunately there is no signs of active infection. The wound is measuring somewhat smaller and overall I feel like he is making good progress at this point. 03/12/2019 on evaluation today patient actually appears to be doing quite well with regard to his foot ulcer. The wound is looking better the measurement is not significantly better at this point unfortunately but nonetheless again the overall appearance is improving I think he is  making some progress. I still think the big issue here is foot drop and again the patient wants to see if potentially he could get a brace to help with this. Subsequently I think that we can definitely make a referral to [facility] to see if they can help  [place] in this regard. He is in agreement with that plan. 03/26/2019 on evaluation today patient appears to be doing more poorly in regard to his foot ulcer at this time. He did see podiatry in order to see about getting a brace but unfortunately he tells me that Medicare is not likely to pay for one for him as he had 1 I guess 8 months ago. However this one that apparently goes in his shoes that does not seem to be working out well for him. Unfortunately there are signs of active infection at this time. No fevers, chills, nausea, vomiting, or diarrhea. Systemically. The patient has erythema on the dorsal surface of his foot that tracks from the lateral portion of his foot and again he does have a blister around the wound as well which is worse compared to previous. Overall I am concerned. 04/02/2019 on evaluation today patient appears to be doing about the same with regard to his wound. Unfortunately there is no signs of significant improvement although I do think there is no signs of infection which is good news. His drainage is also slowed down tremendously. I think at this point he would benefit from the initiation of a total contact cast. The only concern I have is that over his balance. I explained to the patient that if we were to initiate the cast I think it would be greatly beneficial for him but he would need to ensure not to walk at all unless he uses a walker he actually has 2 walkers one for inside his home and one that he keeps out in the car. For that reason he would be covered in that regard. I just do not want him to have any accidents and fall with the cast. 12/28-Patient comes in 1 week out a day early for his appointment he was in the hospital for 2 days for uncontrolled hyperglycemia and required management of high blood sugars, with some adjustments in his home regimen of insulin, patient stubbed his right great toe today and put a Band-Aid on it, he is here for his left lateral foot  ulcer and we are using silver alginate dressing, he was intact total contact cast with difficulty with balance to the extent that that had to be discontinued 04/16/2019 on evaluation today patient appears to be doing better with regard to the overall appearance of his wound size. This is good news. We had attempted a total contact cast but unfortunately he had some falling issues that the family contacted [place] about we took the cast off we did not put it back on. Nonetheless it ended up that just a couple days later he was in the hospital due to elevations in his blood sugar which went to around 500 and even a little higher. Subsequently he was admitted to the hospital and was in the hospital for a total of 3 days and that included Christmas day. Fortunately there got his blood sugar under control he tells me that this is running about 200 now which is much better news. There does not appear to be any signs of active infection at this time. No fevers, chills, nausea, vomiting, or diarrhea. 04/30/2019 on evaluation today  patient actually appears to be doing slightly worse compared to previous as far as the overall size of his wound is concerned. There also is some need for sharp debridement today as well. Fortunately there is no evidence of active infection which is good news. No fevers, chills, nausea, vomiting, or diarrhea. 05/07/2019 upon evaluation today patient appears to be doing excellent in regard to his plantar foot ulcer. He has been tolerating the dressing changes without complication. Fortunately there is no signs of active infection at this time. No fevers, chills, nausea, vomiting, or diarrhea. He does tell me that he actually got in touch with [employer] and they feel like they can add diagnosis codes to his orders in order to get an external brace for him as opposed to the one that goes internal to issue. That would be excellent for his foot drop and I did tell him that if there is anything I  need to fill out in that regard I will be more than happy to oblige and fill out what is needed for [employer]. 05/14/2019 upon evaluation today patient's original wound site actually appears to be doing much better which is good news. Fortunately there is no signs of active infection at this time. No fevers, chills, nausea, vomiting, or diarrhea. With that being said the patient unfortunately is continuing to have issues with rubbing he tells me that the felt slid on his foot although he did not realize it until today. Nonetheless I think this did cause a blister around the edge of the wound and he does an open wound that is slightly larger overall in measurement compared to last time mainly due to this blister. Other than that I feel like he is doing quite well. 05/21/2019 upon evaluation today patient appears to be doing excellent in regard to his wound at this time on the plantar foot. He is showing signs of improvement he does have some slough and biofilm noted on the surface of the wound this is going require some sharp debridement today as well. Fortunately there is no evidence of active infection. No fevers, chills, nausea, vomiting, or diarrhea. 05/28/2019 on evaluation today patient's wound actually appears to be measuring smaller upon initial inspection and I do believe that it is doing well. With that being said he continues to have a blister on the outside/lateral edge of the wound that occurs as a result of rubbing due to his dropfoot currently. Subsequently we have filled out the paperwork for his new external brace but again that does take several weeks to get that completed once everything is returned to back we did send this back last week as far as the paperwork was concerned. With that being said there does not appear to be any signs of active infection at this time. 06/04/2019 on evaluation today patient's wound actually appears to be doing slightly better compared to last week which is  good news. He still [name], [name] ([id number]) states that his insurance unfortunately does not seem to want to cover any of his new brace. He is going to just pay for this himself. Nonetheless he is going by there today when he leaves here that is [employer] in [place] that is going to be making this brace for him. 06/18/2019 upon evaluation today patient fortunately does have his new brace which is the external AFO brace with bilateral uprights. Subsequently he seems to be doing much better in fact the wound already appears better. He has not even been using  the felt he is just using a dressing over top of the wound and overall I am very pleased with how things seem to be progressing. There is no signs of active infection at this time which is great news and I think that the AFO is good to help him tremendously as far as trying to keep pressure off of the 06/25/2019 upon evaluation today patient seems to be making good progress in regard to his wound. He has been tolerating the dressing changes without complication. Fortunately there is no signs of active infection at this time. No fevers, chills, nausea, vomiting, or diarrhea. 07/02/2019 upon evaluation today patient unfortunately had a blistered area on the bottom of his foot tracking more towards the midline of the plantar aspect. He does not seem to have issues with rubbing more laterally that he used to have but nonetheless this is still giving him some trouble and causing problems here. Fortunately there is no signs of active infection at this time. No fevers, chills, nausea, vomiting, or diarrhea. 07/09/2019 upon evaluation today patient appears to be doing well with regard to his plantar foot ulcer. Definitely this is much improved compared to last week's visit which again last week the issue there was that he had an area of callus buildup on the plantar aspect of his foot I was able to clean this away and after effectively doing so the wound  seems to be doing much better which is great news. There is no signs of active infection at this time. 07/16/2019 upon evaluation today patient appears to be making some progress in my opinion with regard to his plantar foot ulcer. He has been tolerating the dressing changes without complication. Fortunately there is no evidence of active infection at this time which is great news. 07/23/2019 upon evaluation today patient appears to be doing well with regard to his wounds on the plantar foot. He has been tolerating the dressing changes without complication. These do seem to be healing. 08/06/2019 upon evaluation today patient's wound actually appears to be showing some signs of improvement though he still has a significant amount of callus buildup over this location. That is can require some debridement today. With that being said the actual wound openings seem to be dramatically improved which is great news. There is no signs of active infection at this time. No fevers, chills, nausea, vomiting, or diarrhea. 08/13/2019 upon evaluation today patient appears to be doing well with regard to his foot ulcer. He has been tolerating the dressing changes without complication. Fortunately there is no signs of active infection at this time. No fevers, chills, nausea, vomiting, or diarrhea. 08/20/2019 upon evaluation today patient appears to be doing excellent in regard to his foot ulcer. I do believe he still showing signs of improvement this is very slow but again I think still pressure getting to the area even with his brace and custom shoe is still part of the main issue with the delay here. Nonetheless I do not see any signs of worsening or infection all of which is good news. 5/20; left lateral foot wound. He wears an AFO brace for foot drop related to his diabetes. The wound is small and clean looking he is using silver collagen. We have apparently tried him in offloading shoe he could not tolerate this. He  does not have good balance. 09/11/2019 upon evaluation today patient appears to be doing more poorly in regard to his foot ulcer as compared to last time I saw  him. Unfortunately he does tell me as we discussed on Friday that he had pus and blood coming from the area underneath the callus. Unfortunately I think that this is a combination of the callus not being trimmed away enough and potentially the felt pad causing an abnormal rubbing which has happened before which led to this becoming more irritated and likely infected based on what I am seeing today. The patient takes ampicillin all the time. Subsequently he also states that he has been trying for a couple of days to take the clindamycin of which she had some leftover from previous. With that being said it upset his stomach. The antibiotic that I sent in for him on Friday unfortunately I wrote the wrong number on as far as the number of pills that he needed. With that being said I corrected that this morning but he tells me is not can pick it up as that seems to be making him have trouble with his stomach being upset at this point. Fortunately there is no signs of active infection at this time systemically. 09/17/2019 upon evaluation today patient appears to be doing better in regard to his foot ulcer. Has been taking ampicillin that he had leftover from a previous prescription. With that being said he did go see his doctor today concerning his diabetes and notes that his hemoglobin A1c was 13 obviously this is elevated and there can be working on that. Also did review the culture result that we finally got back for final well that showed rare Pseudomonas and a few group B strep noted. The Pseudomonas was sensitive to Cipro but again the wound is doing so much better with the ampicillin I think this is probably not a causative organism I do not see anything that really has me concerned as far as infection is concerned today. Therefore I would hold off  on prescribing Cipro for the patient and have him discontinue the ampicillin until completion. 09/24/2019 upon evaluation today patient appears to be doing 09/24/2019 upon evaluation today patient appears to be doing well with regard to his foot ulcer. He actually has not enlarged any and seems to be measuring smaller quite significantly at this point. His toe ulcer is also doing somewhat better which is good news. Fortunately there is no evidence of active infection at this time. No fevers, chills, nausea, vomiting, or diarrhea. 10/08/2019 upon evaluation today patient appears to be doing well with regard to his foot ulcer all things considered. Fortunately there is no signs of active infection at this time. No fevers, chills, nausea, vomiting, or diarrhea. Fortunately he states that overall he has been doing much better with regard to not have any blistering on the side of his foot. Unfortunately he did have a fall last night due to the fact that he got up to walk without his brace on and did not have his walker either it was around 4 in the morning and his mother was calling out. Subsequently he states that the foot drop got him and he ended up following. Nonetheless he states he normally uses his walker I explained he needs to try to make sure not to ever go without the walker. No matter how attempting the situation may be. Readmission: 12/03/2019 upon evaluation today patient presents for follow-up after having had surgery last week on his foot for surgical debridement and then subsequent wound VAC placement. He had gone into the hospital as he was having a significant infection and not feeling  well. He is unfortunately since I last saw him also been under the care of the cancer doctors and they have been taking good care of him. He does seem to be in fairly high spirits all things considered. With that being said he has lung cancer he tells me that his back from the radiation feels like it is  constantly burned. 12/10/2019 on evaluation today patient appears to be doing well with regard to his wound. I do believe the wound VAC is doing a great job at helping to manage his drainage and overall health and to granulate in the wound. He has less undermining noted today in fact almost none. Home health is coming out to change the wound VAC on a regular basis Monday, Wednesday, and Friday. 12/24/2019 on evaluation today patient appears to be doing fairly well in regard to the wound on the plantar aspect of his foot. With that being said he does have a significant feeling in of what has been going on with regard to the ulceration which does not have the depth that it did in the [place] ([id number]) past. With that being said I do believe that based on what I am seeing at this point the patient likely does need to take a break from the wound VAC and in fact it appears the wound VAC was actually placed directly on the skin as far as the bridging was concerned causing some irritation of the top of the patient's foot. This has me concerned as well that if were not careful that could worsen. I do believe a break with the wound VAC however will be fine and then we can see where we go from there. 01/07/2020 upon evaluation today patient actually appears to be doing much better in regard to his wound. He has been tolerating the dressing changes without complication. There is no signs of active infection and overall I feel like he is managing quite nicely. I do believe that we would likely discontinue the [facility] altogether today. 10/13; follow-up visit. Patient has a area on the left lateral plantar foot. He has been using Hydrofera Blue and bordered foam he has home health changing this 3 times a week. He is nonambulatory. He avoids pushing the wheelchair with that part of his foot and instead uses his heel. We have had a nice improvement today and overall surface area 10/27; 2-week follow-up. He has  been using Hydrofera Blue and bordered foam on a wound on the left lateral foot. He told me he was nonambulatory last time from foot drop although he walked in the clinic with a walker this week apparently did not have any transportation. Also he has a new wound on the right dorsal medial foot which she says was caused by foot where trauma 11/10; 2-week follow-up. We have been using Hydrofera Blue on the left lateral foot roughly at the base of the fifth metatarsal. He came in last visit with a punched out area on the right dorsal foot. He says this was foot where trauma. I reiterated that he is not wearing his previous braces and he is in. He spends most of his time in his wheelchair. He says he is careful not to be pushing himself around in the wheelchair with his feet. He is wearing regular tennis shoes. I taken a bit of time to review the history of the wound on the left foot. This actually was a chronic wound for him. Episodic infections. He had arterial studies  done in August these were normal. ABI in the right of 1.05, TBI at 0.65 with triphasic waveforms on the left 1.01 with a TBI of 0.78 and again triphasic waveforms. Does not appear to be a arterial issue 11/17 we brought the patient in today to look at the area on the right dorsal foot and the left lateral plantar foot. The left was the original wound. Patient states the right dorsal foot happened from trauma from his brace. He is no longer using this. He is mostly in surgical shoes that we provide except when he goes out driving. We changed him to silver collagen last week. He does not have an arterial issue He came to the clinic today got out of his car went to the back of his car felt presyncopal and fell down. When we got out there to help him up he was alert conversational. Blood pressure little reduced at 962 systolic: We got him in the exam room. Otherwise he looks fine 03/21/2020 upon evaluation today patient appears to be doing  decently well in regard to his ulcers on the bilateral feet. Fortunately there is no signs of active infection at this time which is great news. No fever chills noted. He has been tolerating the dressing changes without complication. Fortunately there is no sign of active infection at this time. No fevers, chills, nausea, vomiting, or diarrhea. 04/04/2020 upon evaluation today patient's wounds actually are showing signs of improvement at both locations. There is good be some sharp debridement necessary on the plantar foot but overall not much compared to where things have been in the past. Overall I feel like he is doing quite well 04/18/2020 on evaluation today patient appears to be doing well with regard to his wounds currently. Fortunately there is no signs of active infection at this time. No fevers, chills, nausea, vomiting, or diarrhea. With that being said the patient is continuing to experience some issues with open wound on the right foot on the left though this is not draining it does appear to show some signs of callus currently. Objective Constitutional Well-nourished and well-hydrated in no acute distress. Vitals Time Taken: 12:35 PM, Height: 71 in, Weight: 190 lbs, BMI: 26.5, Temperature: 98.2 F, Pulse: 101 bpm, Respiratory Rate: 16 breaths/min, Blood Pressure: 152/86 mmHg. Respiratory normal breathing without difficulty. Psychiatric this patient is able to make decisions and demonstrates good insight into disease process. Alert and Oriented x 3. pleasant and cooperative. General Notes: Upon inspection patient's wound bed actually showed signs of good granulation at this time on the right foot. On the left foot he did have some callus noted I did remove this today and underneath the callus and minimal eschar there was a pinpoint area still open but again this is all but closed. I'm actually very pleased with where things stand today this has been managed here in the clinic and  otherwise for quite some time. Integumentary (Hair, Skin) Wound #7 status is Open. Original cause of wound was Surgical Injury. The wound is located on the [place]. The wound measures 0.1cm length x 0.1cm width x 0.1cm depth; 0.008cm^2 area and 0.001cm^3 volume. There is no tunneling or undermining noted. There is a none present amount of drainage noted. The wound margin is thickened. There is no granulation within the wound bed. There is no necrotic tissue within the wound bed. Wound #9 status is Open. Original cause of wound was Pressure Injury. The wound is located on the Right,Dorsal Foot. The wound  measures 0.7cm length x 0.3cm width x 0.1cm depth; 0.165cm^2 area and 0.016cm^3 volume. There is Fat Layer (Subcutaneous Tissue) exposed. There [name], Dustin E. ([id number]) is no tunneling or undermining noted. There is a medium amount of serosanguineous drainage noted. The wound margin is flat and intact. There is no granulation within the wound bed. There is a large (67-100%) amount of necrotic tissue within the wound bed including Adherent Slough. 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 An Callus Pairing procedure was performed by Tommie Sams., [name]. Post procedure Diagnosis Wound #7: Same as Pre-Procedure Plan 1. Would recommend currently that we go ahead and continue with the wound care measures as before with regard to the right foot were using a silver collagen dressing. 2. I'm also can recommend at this time that we have the patient continue to try to  keep pressure off the area as much as he can. 3. I'm also can recommend that we use a border foam dressing for protection of the left foot I think that is appropriate based on what I'm seeing currently. All this was discussed with the patient during the office visit today. We will see patient back for reevaluation in 2 weeks here in the clinic. If anything worsens or changes patient will contact our office for additional recommendations. Electronic Signature(s) Signed: 04/18/2020 1:09:48 PM By: Worthy Keeler [name] Entered By: Worthy Keeler on 04/18/2020 13:09:48 [name] ([id number]) -------------------------------------------------------------------------------- SuperBill Details Patient Name: [name] Date of Service: 04/18/2020 Medical Record Number: [id number] Patient Account Number: [id number] Date of Birth/Sex: August 09, 1945 (74 y.o. M) Treating RN: Cornell Barman Primary Care Provider: [name] Other Clinician: Referring Provider: [name] Treating Provider/Extender: Skipper Cliche in Treatment: 19 Diagnosis Coding ICD-10 Codes Code Description E11.621 Type 2 diabetes mellitus with foot ulcer L97.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scle L97.512 Non-pressure chronic ulcer of other part of right foot with fat layer exposed E11.42 Type 2 diabetes mellitus with diabetic polyneuropathy M21.372 Foot drop, left foot Facility Procedures CPT4 Code: [id number] Description: 09470 - PARE BENIGN LES; SGL Modifier: Quantity: 1 CPT4 Code: Description: ICD-10 Diagnosis Description L97.523 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 Modifier: muscle Quantity: Physician Procedures CPT4 Code: [id number] Description: 29476 - WC PHYS PARE BENIGN LES; SGL Modifier: Quantity: 1 CPT4 Code: Description: ICD-10 Diagnosis Description L97.523 Non-pressure chronic ulcer of other part of left foot with  necrosis of m Modifier: uscle Quantity: Electronic Signature(s) Signed: 04/18/2020 1:10:06 PM By: Worthy Keeler [name] Entered By: Worthy Keeler on 04/18/2020 13:10:06

## 2020-04-18 NOTE — Progress Notes (Signed)
[name], [name] ([id number]) Visit Report for 04/18/2020 Arrival Information Details Patient Name: [name], [name] Date of Service: 04/18/2020 12:30 PM Medical Record Number: [id number] Patient Account Number: [id number] Date of Birth/Sex: 27-Nov-1945 (74 y.o. M) Treating [name]: [name] Primary Care [name]: [name] Other Clinician: Referring [name]: [name] Treating [name]/Extender: [name] in Treatment: 97 Visit Information History Since Last Visit Added or deleted any medications: No Patient Arrived: Wheel Chair Any new allergies or adverse reactions: No Arrival Time: 12:37 Had a fall or experienced change in No Accompanied By: grandson activities of daily living that may affect Transfer Assistance: None risk of falls: Patient Identification Verified: Yes Signs or symptoms of abuse/neglect since last visito No Secondary Verification Process Completed: Yes Hospitalized since last visit: No Patient Requires Transmission-Based Precautions: No Implantable device outside of the clinic excluding No cellular tissue based products placed in the center since last visit: Has Dressing in Place as Prescribed: No Pain Present Now: No Electronic Signature(s) Signed: 04/18/2020 5:05:25 PM By: [name] RCP, RRT, CHT Entered By: [name] on 04/18/2020 12:38:09 [name] ([id number]) -------------------------------------------------------------------------------- Encounter Discharge Information Details Patient Name: [name] Date of Service: 04/18/2020 12:30 PM Medical Record Number: [id number] Patient Account Number: [id number] Date of Birth/Sex: 1945-05-18 (74 y.o. M) Treating [name]: [name] Primary Care Lenvil Swaim: [name] Other Clinician: Referring Liberato Stansbery: [name] Treating Bastian Andreoli/Extender: [name] in Treatment: 19 Encounter Discharge Information Items Discharge Condition:  Stable Ambulatory Status: Wheelchair Discharge Destination: Home Transportation: Private Auto Accompanied By: self Schedule Follow-up Appointment: Yes Clinical Summary of Care: Electronic Signature(s) Signed: 04/18/2020 5:26:41 PM By: [name], BSN, [name], CWS, [name], BSN Entered By: [name], BSN, [name], CWS, Dustin on 04/18/2020 13:11:49 [name] ([id number]) -------------------------------------------------------------------------------- Lower Extremity Assessment Details Patient Name: [name] Date of Service: 04/18/2020 12:30 PM Medical Record Number: [id number] Patient Account Number: [id number] Date of Birth/Sex: 1946/04/02 (74 y.o. M) Treating [name]: [name] Primary Care Kalii Chesmore: [name] Other Clinician: Referring [name]: [name] Treating Porshe Fleagle/Extender: [name] in Treatment: 19 Edema Assessment Assessed: [Left: No] [Right: No] [Left: Edema] [Right: :] Calf Left: Right: Point of Measurement: From Medial Instep 31 cm 30 cm Ankle Left: Right: Point of Measurement: From Medial Instep 21 cm 20 cm Electronic Signature(s) Signed: 04/18/2020 4:22:48 PM By: Dustin Coria [name] Entered By: [name] on 04/18/2020 12:49:25 [name] ([id number]) -------------------------------------------------------------------------------- Multi Wound Chart Details Patient Name: [name] Date of Service: 04/18/2020 12:30 PM Medical Record Number: [id number] Patient Account Number: [id number] Date of Birth/Sex: 1945/11/21 (74 y.o. M) Treating [name]: [name] Primary Care Ardyth Kelso: [name] Other Clinician: Referring [name]: [name] Treating [name]/Extender: [name] in Treatment: 19 Vital Signs Height(in): 71 Pulse(bpm): 101 Weight(lbs): 190 Blood Pressure(mmHg): 152/86 Body Mass Index(BMI): 26 Temperature(F): 98.2 Respiratory Rate(breaths/min): 16 Photos: [N/A:N/A] Wound Location: Left, Lateral, Plantar  Foot Right, Dorsal Foot N/A Wounding Event: Surgical Injury Pressure Injury N/A Primary Etiology: Diabetic Wound/Ulcer of the Lower Diabetic Wound/Ulcer of the Lower N/A Extremity Extremity Secondary Etiology: N/A Pressure Ulcer N/A Comorbid History: Cataracts, Middle ear problems, Cataracts, Middle ear problems, N/A Sleep Apnea, Hypertension, Type II Sleep Apnea, Hypertension, Type II Diabetes, Gout, Osteoarthritis, Diabetes, Gout, Osteoarthritis, Neuropathy, Received Neuropathy, Received Chemotherapy Chemotherapy Date Acquired: 11/23/2019 01/23/2020 N/A Weeks of Treatment: 19 10 N/A Wound Status: Open Open N/A Measurements L x W x D (cm) 0.1x0.1x0.1 0.7x0.3x0.1 N/A Area (cm) : 0.008 0.165 N/A Volume (cm) : 0.001 0.016  N/A % Reduction in Area: 99.90% 47.50% N/A % Reduction in Volume: 100.00% 48.40% N/A Classification: Grade 3 Grade 2 N/A Exudate Amount: None Present Medium N/A Exudate Type: N/A Serosanguineous N/A Exudate Color: N/A red, brown N/A Wound Margin: Thickened Flat and Intact N/A Granulation Amount: None Present (0%) None Present (0%) N/A Necrotic Amount: None Present (0%) Large (67-100%) N/A Exposed Structures: Fascia: No Fat Layer (Subcutaneous Tissue): N/A Fat Layer (Subcutaneous Tissue): Yes No Fascia: No Tendon: No Tendon: No Muscle: No Muscle: No Joint: No Joint: No Bone: No Bone: No Epithelialization: Large (67-100%) Medium (34-66%) N/A Treatment Notes Electronic Signature(s) Signed: 04/18/2020 5:26:41 PM By: [name], BSN, [name], CWS, [name], BSN Entered By: [name], BSN, [name], CWS, Dustin on 04/18/2020 [name], [name]. ([id number], [name] ([id number]) -------------------------------------------------------------------------------- Multi-Disciplinary Care Plan Details Patient Name: [name] Date of Service: 04/18/2020 12:30 PM Medical Record Number: [id number] Patient Account Number: [id number] Date of Birth/Sex: Mar 08, 1946 (74 y.o.  M) Treating [name]: [name] Primary Care Kosisochukwu Burningham: [name] Other Clinician: Referring Anarely Nicholls: [name] Treating Trystyn Sitts/Extender: [name] in Treatment: 19 Active Inactive Necrotic Tissue Nursing Diagnoses: Impaired tissue integrity related to necrotic/devitalized tissue Goals: Necrotic/devitalized tissue will be minimized in the wound bed Date Initiated: 12/03/2019 Target Resolution Date: 12/07/2019 Goal Status: Active Interventions: Assess patient pain level pre-, during and post procedure and prior to discharge Treatment Activities: Apply topical anesthetic as ordered : 12/03/2019 Notes: Orientation to the Wound Care Program Nursing Diagnoses: Knowledge deficit related to the wound healing center program Goals: Patient/caregiver will verbalize understanding of the [place] Date Initiated: 12/03/2019 Target Resolution Date: 12/07/2019 Goal Status: Active Interventions: Provide education on orientation to the wound center Notes: Pain, Acute or Chronic Nursing Diagnoses: Pain Management - Cyclic Acute (Dressing Change Related) Goals: Patient will verbalize adequate pain control and receive pain control interventions during procedures as needed Date Initiated: 12/03/2019 Target Resolution Date: 12/07/2019 Goal Status: Active Interventions: Assess comfort goal upon admission Treatment Activities: Administer pain control measures as ordered : 12/03/2019 Notes: Wound/Skin Impairment Nursing Diagnoses: Impaired tissue integrity LILTON, PARE ([id number]) Goals: Ulcer/skin breakdown will have a volume reduction of 30% by week 4 Date Initiated: 12/03/2019 Target Resolution Date: 01/03/2020 Goal Status: Active Ulcer/skin breakdown will have a volume reduction of 50% by week 8 Date Initiated: 12/03/2019 Target Resolution Date: 02/02/2020 Goal Status: Active Ulcer/skin breakdown will have a volume reduction of 80% by week 12 Date  Initiated: 12/03/2019 Target Resolution Date: 03/04/2020 Goal Status: Active Interventions: Assess ulceration(s) every visit Treatment Activities: Referred to DME Jermery Caratachea for dressing supplies : 12/03/2019 Topical wound management initiated : 12/03/2019 Notes: Electronic Signature(s) Signed: 04/18/2020 5:26:41 PM By: [name], BSN, [name], CWS, [name], BSN Entered By: [name], BSN, [name], CWS, Dustin on 04/18/2020 13:07:57 [name] ([id number]) -------------------------------------------------------------------------------- Pain Assessment Details Patient Name: [name] Date of Service: 04/18/2020 12:30 PM Medical Record Number: [id number] Patient Account Number: [id number] Date of Birth/Sex: [birth year] (74 y.o. M) Treating [name]: [name] Primary Care Carlyle Mcelrath: [name] Other Clinician: Referring Mattix Imhof: [name] Treating Feliz Herard/Extender: [name] in Treatment: 19 Active Problems Location of Pain Severity and Description of Pain Patient Has Paino No Site Locations Pain Management and Medication Current Pain Management: Electronic Signature(s) Signed: 04/18/2020 4:22:48 PM By: Dustin Coria [name] Entered By: [name] on 04/18/2020 12:46:36 [name] ([id number]) -------------------------------------------------------------------------------- Patient/Caregiver Education Details Patient Name: [name] Date of Service: 04/18/2020 12:30 PM Medical Record Number: [id number] Patient Account  Number: [id number] Date of Birth/Gender: February 07, 1946 (75 y.o. M) Treating [name]: [name] Primary Care Physician: [name] Other Clinician: Referring Physician: Owens [name] Treating Physician/Extender: [name] in Treatment: 39 Education Assessment Education Provided To: Patient Education Topics Provided Wound/Skin Impairment: Handouts: Caring for Your Ulcer Methods: Demonstration, Explain/Verbal Responses: State content  correctly Electronic Signature(s) Signed: 04/18/2020 5:26:41 PM By: [name], BSN, [name], CWS, [name], BSN Entered By: [name], BSN, [name], CWS, Dustin on 04/18/2020 13:11:17 [name] ([id number]) -------------------------------------------------------------------------------- Wound Assessment Details Patient Name: [name] Date of Service: 04/18/2020 12:30 PM Medical Record Number: [id number] Patient Account Number: [id number] Date of Birth/Sex: 08-19-1945 (74 y.o. M) Treating [name]: [name] Primary Care Gayathri Futrell: [name] Other Clinician: Referring Trannie Bardales: [name] Treating Mckinlee Dunk/Extender: [name] in Treatment: 19 Wound Status Wound Number: 7 Primary Diabetic Wound/Ulcer of the Lower Extremity Etiology: Wound Location: Left, Lateral, Plantar Foot Wound Open Wounding Event: Surgical Injury Status: Date Acquired: 11/23/2019 Comorbid Cataracts, Middle ear problems, Sleep Apnea, Weeks Of Treatment: 19 History: Hypertension, Type II Diabetes, Gout, Osteoarthritis, Clustered Wound: No Neuropathy, Received Chemotherapy Photos Wound Measurements Length: (cm) 0.1 % R Width: (cm) 0.1 % R Depth: (cm) 0.1 Epi Area: (cm) 0.008 Tu Volume: (cm) 0.001 Un eduction in Area: 99.9% eduction in Volume: 100% thelialization: Large (67-100%) nneling: No dermining: No Wound Description Classification: Grade 3 Fou Wound Margin: Thickened Slo Exudate Amount: None Present l Odor After Cleansing: No ugh/Fibrino No Wound Bed Granulation Amount: None Present (0%) Exposed Structure Necrotic Amount: None Present (0%) Fascia Exposed: No Fat Layer (Subcutaneous Tissue) Exposed: No Tendon Exposed: No Muscle Exposed: No Joint Exposed: No Bone Exposed: No Treatment Notes Wound #7 (Foot) Wound Laterality: Plantar, Left, Lateral Cleanser Peri-Wound Care Topical Primary Dressing AAHAN, MARQUES ([id number]) Secondary Dressing Bordered Gauze Sterile-HBD 4x4  (in/in) Discharge Instruction: Cover wound with Bordered Guaze Sterile as directed Secured With Compression Wrap Compression Stockings Environmental education officer) Signed: 04/18/2020 5:26:41 PM By: [name], BSN, [name], CWS, [name], BSN Entered By: [name], BSN, [name], CWS, Dustin on 04/18/2020 13:00:41 [name] ([id number]) -------------------------------------------------------------------------------- Wound Assessment Details Patient Name: [name] Date of Service: 04/18/2020 12:30 PM Medical Record Number: [id number] Patient Account Number: [id number] Date of Birth/Sex: 12-31-1945 (74 y.o. M) Treating [name]: [name] Primary Care Davi Kroon: [name] Other Clinician: Referring Adham : [name] Treating Donnia Poplaski/Extender: [name] in Treatment: 19 Wound Status Wound Number: 9 Primary Diabetic Wound/Ulcer of the Lower Extremity Etiology: Wound Location: Right, Dorsal Foot Secondary Pressure Ulcer Wounding Event: Pressure Injury Etiology: Date Acquired: 01/23/2020 Wound Open Weeks Of Treatment: 10 Status: Clustered Wound: No Comorbid Cataracts, Middle ear problems, Sleep Apnea, History: Hypertension, Type II Diabetes, Gout, Osteoarthritis, Neuropathy, Received Chemotherapy Photos Wound Measurements Length: (cm) 0.7 % Reduct Width: (cm) 0.3 % Reduct Depth: (cm) 0.1 Epitheli Area: (cm) 0.165 Tunneli Volume: (cm) 0.016 Undermi ion in Area: 47.5% ion in Volume: 48.4% alization: Medium (34-66%) ng: No ning: No Wound Description Classification: Grade 2 Foul Odo Wound Margin: Flat and Intact Slough/F Exudate Amount: Medium Exudate Type: Serosanguineous Exudate Color: red, brown r After Cleansing: No ibrino Yes Wound Bed Granulation Amount: None Present (0%) Exposed Structure Necrotic Amount: Large (67-100%) Fascia Exposed: No Necrotic Quality: Adherent Slough Fat Layer (Subcutaneous Tissue) Exposed: Yes Tendon Exposed: No Muscle  Exposed: No Joint Exposed: No Bone Exposed: No Treatment Notes Wound #9 (Foot) Wound Laterality: Dorsal, Right Cleanser Peri-Wound Care ZYMARION, FAVORITE ([id number]) Topical Primary Dressing Prisma 4.34 (in) Discharge Instruction:  Moisten w/normal saline or sterile water; Cover wound as directed. Do not remove from wound bed. Secondary Dressing Bordered Gauze Sterile-HBD 4x4 (in/in) Discharge Instruction: Cover wound with Bordered Guaze Sterile as directed Secured With Compression Wrap Compression Stockings Add-Ons Electronic Signature(s) Signed: 04/18/2020 4:22:48 PM By: Dustin Coria [name] Entered By: [name] on 04/18/2020 12:48:14 [name] ([id number]) -------------------------------------------------------------------------------- [place] Details Patient Name: [name] Date of Service: 04/18/2020 12:30 PM Medical Record Number: [id number] Patient Account Number: [id number] Date of Birth/Sex: 1945-07-20 (74 y.o. M) Treating [name]: [name] Primary Care Anjolaoluwa Siguenza: [name] Other Clinician: Referring Seraphim Trow: [name] Treating Telisa Ohlsen/Extender: [name] in Treatment: 19 Vital Signs Time Taken: 12:35 Temperature (F): 98.2 Height (in): 71 Pulse (bpm): 101 Weight (lbs): 190 Respiratory Rate (breaths/min): 16 Body Mass Index (BMI): 26.5 Blood Pressure (mmHg): 152/86 Reference Range: 80 - 120 mg / dl Electronic Signature(s) Signed: 04/18/2020 5:05:25 PM By: [name] RCP, RRT, CHT Entered By: [name] on 04/18/2020 12:42:56

## 2020-04-18 NOTE — Telephone Encounter (Signed)
[name] clinical mgr with [facility] said pt was d/c from hospital on 03/27/20 and [facility] is seeing pt for wound care for ulcer on rt foot. [facility] is needing the staging of wound process for medicare. When pt was discharged from hospital the staging of wound was not provided. The [facility] nurse that is going out said appears to be a stg 2 but [name] also wants to verify that pt is FU with appts after discharge from hospital. [name] that pt saw Dustin Cos PA at wound care as FU from hospital on 04/04/20 and has appt today 04/18/20 also. [name] said she would contact wound center for the above info. Nothing further needed at this time. FYI to Dr Lorelei Pont.

## 2020-04-18 NOTE — Telephone Encounter (Signed)
Agree.  Being managed by Wound, and I would defer to them.

## 2020-04-24 ENCOUNTER — Other Ambulatory Visit: Payer: Self-pay | Admitting: Radiation Oncology

## 2020-04-24 ENCOUNTER — Telehealth: Payer: Self-pay | Admitting: *Deleted

## 2020-04-24 ENCOUNTER — Ambulatory Visit (HOSPITAL_COMMUNITY): Payer: Medicare Other | Admitting: Physician Assistant

## 2020-04-24 ENCOUNTER — Ambulatory Visit: Admission: RE | Admit: 2020-04-24 | Payer: Medicare Other | Source: Ambulatory Visit

## 2020-04-24 ENCOUNTER — Ambulatory Visit: Admission: RE | Admit: 2020-04-24 | Payer: Medicare Other | Source: Ambulatory Visit | Admitting: Radiation Oncology

## 2020-04-24 ENCOUNTER — Other Ambulatory Visit: Payer: Self-pay

## 2020-04-24 DIAGNOSIS — C3490 Malignant neoplasm of unspecified part of unspecified bronchus or lung: Secondary | ICD-10-CM

## 2020-04-24 NOTE — Telephone Encounter (Signed)
Called patient to inform of MRI for 04-26-20- arrival time- 7:30 am @ [facility], no restrictions to test, spoke with patient and he is aware of this test

## 2020-04-24 NOTE — Telephone Encounter (Signed)
Called patient to cancel consult with Dr. Lisbeth Renshaw. Patient will need to be scheduled for an MRI.

## 2020-04-26 ENCOUNTER — Other Ambulatory Visit: Payer: Self-pay

## 2020-04-26 ENCOUNTER — Ambulatory Visit (HOSPITAL_COMMUNITY)
Admission: RE | Admit: 2020-04-26 | Discharge: 2020-04-26 | Disposition: A | Discharge status: Home / Self Care | Payer: Medicare Other | Source: Ambulatory Visit | Attending: Radiation Oncology | Admitting: Radiation Oncology

## 2020-04-26 DIAGNOSIS — N1831 Chronic kidney disease, stage 3a: Secondary | ICD-10-CM | POA: Diagnosis not present

## 2020-04-26 DIAGNOSIS — I129 Hypertensive chronic kidney disease with stage 1 through stage 4 chronic kidney disease, or unspecified chronic kidney disease: Secondary | ICD-10-CM | POA: Diagnosis not present

## 2020-04-26 DIAGNOSIS — D61810 Antineoplastic chemotherapy induced pancytopenia: Secondary | ICD-10-CM | POA: Diagnosis not present

## 2020-04-26 DIAGNOSIS — C3490 Malignant neoplasm of unspecified part of unspecified bronchus or lung: Secondary | ICD-10-CM | POA: Diagnosis not present

## 2020-04-26 DIAGNOSIS — E1142 Type 2 diabetes mellitus with diabetic polyneuropathy: Secondary | ICD-10-CM | POA: Diagnosis not present

## 2020-04-26 DIAGNOSIS — L97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scle: Secondary | ICD-10-CM | POA: Diagnosis not present

## 2020-04-26 DIAGNOSIS — E11621 Type 2 diabetes mellitus with foot ulcer: Secondary | ICD-10-CM | POA: Diagnosis not present

## 2020-04-26 DIAGNOSIS — Z87891 Personal history of nicotine dependence: Secondary | ICD-10-CM | POA: Diagnosis not present

## 2020-04-26 DIAGNOSIS — H538 Other visual disturbances: Secondary | ICD-10-CM | POA: Diagnosis not present

## 2020-04-26 DIAGNOSIS — C3491 Malignant neoplasm of unspecified part of right bronchus or lung: Secondary | ICD-10-CM | POA: Diagnosis not present

## 2020-04-26 DIAGNOSIS — I480 Paroxysmal atrial fibrillation: Secondary | ICD-10-CM | POA: Diagnosis not present

## 2020-04-26 DIAGNOSIS — J329 Chronic sinusitis, unspecified: Secondary | ICD-10-CM | POA: Diagnosis not present

## 2020-04-26 DIAGNOSIS — J45909 Unspecified asthma, uncomplicated: Secondary | ICD-10-CM | POA: Diagnosis not present

## 2020-04-26 DIAGNOSIS — J3489 Other specified disorders of nose and nasal sinuses: Secondary | ICD-10-CM | POA: Diagnosis not present

## 2020-04-26 DIAGNOSIS — E041 Nontoxic single thyroid nodule: Secondary | ICD-10-CM | POA: Diagnosis not present

## 2020-04-26 DIAGNOSIS — Z794 Long term (current) use of insulin: Secondary | ICD-10-CM | POA: Diagnosis not present

## 2020-04-26 DIAGNOSIS — E1122 Type 2 diabetes mellitus with diabetic chronic kidney disease: Secondary | ICD-10-CM | POA: Diagnosis not present

## 2020-04-26 DIAGNOSIS — I4892 Unspecified atrial flutter: Secondary | ICD-10-CM | POA: Diagnosis not present

## 2020-04-26 DIAGNOSIS — R000 Tachycardia, unspecified: Secondary | ICD-10-CM | POA: Diagnosis not present

## 2020-04-26 DIAGNOSIS — Z9181 History of falling: Secondary | ICD-10-CM | POA: Diagnosis not present

## 2020-04-26 DIAGNOSIS — L97512 Non-pressure chronic ulcer of other part of right foot with fat layer exposed: Secondary | ICD-10-CM | POA: Diagnosis not present

## 2020-04-26 DIAGNOSIS — G4733 Obstructive sleep apnea (adult) (pediatric): Secondary | ICD-10-CM | POA: Diagnosis not present

## 2020-04-26 MED ORDER — GADOBUTROL 1 MMOL/ML IV SOLN
8.0000 mL | Freq: Once | INTRAVENOUS | Status: AC | PRN
  Administered 2020-04-26: 8 mL via INTRAVENOUS

## 2020-04-28 ENCOUNTER — Telehealth: Payer: Self-pay | Admitting: Radiation Oncology

## 2020-04-28 NOTE — Telephone Encounter (Signed)
I called the patient to let him know his MRI results and reviewed the rationale for PCI to the brain to reduce risks of future brain disease. He is in agreement to proceed with simulation on 05/07/20. We reviewed risks, benefits, short, and long term effects of treatment and he is also already taking Namenda, but I will check with Dr. Mickeal Skinner to see if he knows of any additional dosing considerations for Namenda to try to reduce changes in cognitive effects.

## 2020-05-02 ENCOUNTER — Other Ambulatory Visit: Payer: Self-pay

## 2020-05-02 ENCOUNTER — Encounter: Payer: Medicare Other | Admitting: Physician Assistant

## 2020-05-02 DIAGNOSIS — C3490 Malignant neoplasm of unspecified part of unspecified bronchus or lung: Secondary | ICD-10-CM | POA: Diagnosis not present

## 2020-05-02 DIAGNOSIS — T8189XA Other complications of procedures, not elsewhere classified, initial encounter: Secondary | ICD-10-CM | POA: Diagnosis not present

## 2020-05-02 DIAGNOSIS — L97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scle: Secondary | ICD-10-CM | POA: Diagnosis not present

## 2020-05-02 DIAGNOSIS — E11621 Type 2 diabetes mellitus with foot ulcer: Secondary | ICD-10-CM | POA: Diagnosis not present

## 2020-05-02 DIAGNOSIS — L89899 Pressure ulcer of other site, unspecified stage: Secondary | ICD-10-CM | POA: Diagnosis not present

## 2020-05-02 DIAGNOSIS — E1142 Type 2 diabetes mellitus with diabetic polyneuropathy: Secondary | ICD-10-CM | POA: Diagnosis not present

## 2020-05-02 DIAGNOSIS — M21372 Foot drop, left foot: Secondary | ICD-10-CM | POA: Diagnosis not present

## 2020-05-02 DIAGNOSIS — Z794 Long term (current) use of insulin: Secondary | ICD-10-CM | POA: Diagnosis not present

## 2020-05-02 NOTE — Progress Notes (Addendum)
[name], [name] ([id number]) Visit Report for 05/02/2020 Chief Complaint Document Details Patient Name: [name], [name] Date of Service: 05/02/2020 12:45 PM Medical Record Number: [id number] Patient Account Number: [id number] Date of Birth/Sex: 11-Dec-1945 (75 y.o. M) Treating RN: Cornell Barman Primary Care Provider: [name] Other Clinician: Referring Provider: [name] Treating Provider/Extender: Skipper Cliche in Treatment: 21 Information Obtained from: Patient Chief Complaint Left plantar foot ulcer s/p surgical debridement and wound VAC placement. Left great toe ulcer. Electronic Signature(s) Signed: 05/02/2020 1:33:17 PM By: Worthy Keeler [name] Entered By: Worthy Keeler on 05/02/2020 13:33:17 [name] ([id number]) -------------------------------------------------------------------------------- HPI Details Patient Name: [name] Date of Service: 05/02/2020 12:45 PM Medical Record Number: [id number] Patient Account Number: [id number] Date of Birth/Sex: May 03, 1945 (75 y.o. M) Treating RN: Cornell Barman Primary Care Provider: [name] Other Clinician: Referring Provider: [name] Treating Provider/Extender: Skipper Cliche in Treatment: 21 History of Present Illness HPI Description: 10/04/16 on evaluation today patient presents for initial evaluation concerning a laceration of the right wrist and hand on the bowler aspect. He tells me that he fell into glass following a medication change in regard to his diabetes. He initially went to [facility] where she was sutured and subsequently his daughter who is a Marine scientist removed the teachers at the appropriate time. Unfortunately the wound has done poorly and dehissed which has caused him some trouble including infection. He has been on antibiotics both topically and orally though the wound continues to show signs of necrotic tissue according to notes which is what he was sent to [place] for  evaluation concerning. He does have diabetes and unfortunately this is severely uncontrolled. He does see an endocrinologist and is working on this. 10/11/2016 -- the patient's notes from the ER were reviewed and he had his suturing done on 09/17/2016 and had necrotic skin flaps which had to be trimmed the last visit he was here. He has managed to get his Santyl ointment and has been using this appropriately. 10/21/16 patient's wound appears to be doing very well on evaluation today. He still has some Slough covering the wound bed but this is nowhere near as severe as when i saw this initially two weeks ago nor even my review of his pictures last week. I'm pleased with how this is progressing. Patient's wound over the right form appears to be doing fairly well on evaluation today. There is no evidence of infection and he has a small area at the crease of where she is wrist extends and flexes that is still open and appears to be having difficulty due to the fact that it is cracking open. I think this is something that may be controlled with moisture control that is keeping the wound a little bit more moist. Nonetheless this is a tiny region compared to the initial wound and overall appears to be doing very well. No fevers, chills, nausea, or vomiting noted at this time. 11/25/16 on evaluation today patient appears to be doing well in regard to his right wrist ulcer. In fact this appears to be completely healed he is having no discomfort. He is pleased with how this is progressed. Readmission: 06/24/17 on evaluation today patient appears back in our clinic regarding issues that he has been having with his right ankle on the lateral malleolus as well is in the dorsal surface of his left foot. Both have been going on for several weeks unfortunately. He tells me that initially this was being managed  by Dr. Edilia Bo and I did review the notes from Dr. Edilia Bo in epic. Patient was placed on amoxicillin  initially and then subsequently Keflex following. He has been using triple antibiotic ointment over-the-counter along with a Band-Aid for the wound currently. He did not have any Santyl remaining. His most recent hemoglobin A1c which was just two days ago was 10.3. Fortunately he seems to have excellent blood flow in the bilateral lower extremities with his left ABI being 0.95 in the right ABI 1.01. Patient does have some pain in regard to each of these ulcerations but he tells me it's not nearly as severe as the hand wound that I help treat earlier over the summer. This was in 2018. 07/15/17 on evaluation today patient's lower surety ulcer is actually both appear to be doing fairly well at this point. He does have a little bit of skin overlapping the ulcer on the right lateral ankle this was debrided away today just with saline and gauze without complication patient had no discomfort or pain. Otherwise patient's left foot ulcer does appear to be showing signs of improvement he does seem to have a right great toe. Nikki and noted at this point in time. I think that this does need to be managed at this point. No fevers, chills, nausea, or vomiting noted at this time. 07/22/17 on evaluation today patient's wounds in regard to his lower extremities appear to be doing somewhat better. His right ankle wound in fact appears to likely be completely closed. The left dorsal foot ulcer though still open does appear to be a little bit smaller and does also seem to be making good progress. He has not been having problems with the treatment at this point in the general he does not seem to show as much erythema surrounding the wound bed which is good news. He is not having significant discomfort. 08/12/17 on evaluation today patients wounds of both appeared to be doing better in fact they both appear to be completely healed. Overall I'm very pleased with how things have progressed over the past week even more than what  I expected. Obviously this is good news and he is happy about this. He did have a fall when going to his orthopedic doctor recently fortunately he does not appear to have injured anything severely but nonetheless does have a couple of bumps and bruises nothing significant at this time but he needs to be seen and wound care for. He may have broken his left wrist he is following up with orthopedics in that regard. Readmission: 10/24/17 on evaluation today patient presents for reevaluation after having been discharged Aug 12, 2017. His wound was healed at that point although he states that it has been somewhat discolored since that time. He has not experienced the drainage as far as the wound is concerned but the fact that it was still discolored been greater than two months after the fact made him want to come in for reevaluation. Upon inspection today the patient has no pain although he does have neuropathy. He does have a discolored region of the dorsal surface of the left foot which was the site where the ulcer was that we previously treated. The good news is he does not again have any discomfort although obviously I do believe this may be some scar tissue and just discoloration in that regard. Again he hasn't had any drainage or discharge since I last saw him and at this point I see no evidence of that  either this area shows complete epithelialization. In general I do not feel like there's any significant issue at this point in that regard. READMISSION 02/09/2019 This is a 75 year old man with type 2 diabetes poorly controlled with significant peripheral neuropathy. He has been in this clinic before in 2018 with a laceration on his right hand and then again in 2019 with an area over the right lateral malleolus. 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 He states that the wound is been there for the last week  or XZAVIOR, REINIG. ([id number]) 2. It is been uncomfortable to walk on. The man is an active man having to look after an elderly mother. Past medical history, type 2 diabetes with polyneuropathy with a recent hemoglobin A1c in September 2011 0.3, lumbar disc disease, hypergonadism., Left foot drop and gout. We did not repeat his ABIs in the clinic quoting values from July 2019 of 0.95 and 1.01. He is not felt to have significant PAD 02/19/2019 on evaluation today patient actually appears to be doing better with regard to his foot wound based on what I am seeing currently. He was seen by Dr. Dellia Nims on readmission back into the clinic last week when I was off. The good news is the patient has been tolerating the dressing changes without any complication in fact has not really had much in the way of drainage. He does have an AFO brace which she believes is actually pushing on this area as well and has contributed to the issue. Fortunately there is no signs of active infection at this point. His ABIs appear to be well. 03/05/2019 on evaluation today patient appears to be doing well in regard to his foot ulcer. He has been tolerating the dressing changes without complication. Fortunately there is no signs of active infection. The wound is measuring somewhat smaller and overall I feel like he is making good progress at this point. 03/12/2019 on evaluation today patient actually appears to be doing quite well with regard to his foot ulcer. The wound is looking better the measurement is not significantly better at this point unfortunately but nonetheless again the overall appearance is improving I think he is making some progress. I still think the big issue here is foot drop and again the patient wants to see if potentially he could get a brace to help with this. Subsequently I think that we can definitely make a referral to [facility] to see if they can help [place] in this regard. He is in agreement with that  plan. 03/26/2019 on evaluation today patient appears to be doing more poorly in regard to his foot ulcer at this time. He did see podiatry in order to see about getting a brace but unfortunately he tells me that Medicare is not likely to pay for one for him as he had 1 I guess 8 months ago. However this one that apparently goes in his shoes that does not seem to be working out well for him. Unfortunately there are signs of active infection at this time. No fevers, chills, nausea, vomiting, or diarrhea. Systemically. The patient has erythema on the dorsal surface of his foot that tracks from the lateral portion of his foot and again he does have a blister around the wound as well which is worse compared to previous. Overall I am concerned. 04/02/2019 on evaluation today patient appears to be doing about  the same with regard to his wound. Unfortunately there is no signs of significant improvement although I do think there is no signs of infection which is good news. His drainage is also slowed down tremendously. I think at this point he would benefit from the initiation of a total contact cast. The only concern I have is that over his balance. I explained to the patient that if we were to initiate the cast I think it would be greatly beneficial for him but he would need to ensure not to walk at all unless he uses a walker he actually has 2 walkers one for inside his home and one that he keeps out in the car. For that reason he would be covered in that regard. I just do not want him to have any accidents and fall with the cast. 12/28-Patient comes in 1 week out a day early for his appointment he was in the hospital for 2 days for uncontrolled hyperglycemia and required management of high blood sugars, with some adjustments in his home regimen of insulin, patient stubbed his right great toe today and put a Band-Aid on it, he is here for his left lateral foot ulcer and we are using silver alginate  dressing, he was intact total contact cast with difficulty with balance to the extent that that had to be discontinued 04/16/2019 on evaluation today patient appears to be doing better with regard to the overall appearance of his wound size. This is good news. We had attempted a total contact cast but unfortunately he had some falling issues that the family contacted [place] about we took the cast off we did not put it back on. Nonetheless it ended up that just a couple days later he was in the hospital due to elevations in his blood sugar which went to around 500 and even a little higher. Subsequently he was admitted to the hospital and was in the hospital for a total of 3 days and that included Christmas day. Fortunately there got his blood sugar under control he tells me that this is running about 200 now which is much better news. There does not appear to be any signs of active infection at this time. No fevers, chills, nausea, vomiting, or diarrhea. 04/30/2019 on evaluation today patient actually appears to be doing slightly worse compared to previous as far as the overall size of his wound is concerned. There also is some need for sharp debridement today as well. Fortunately there is no evidence of active infection which is good news. No fevers, chills, nausea, vomiting, or diarrhea. 05/07/2019 upon evaluation today patient appears to be doing excellent in regard to his plantar foot ulcer. He has been tolerating the dressing changes without complication. Fortunately there is no signs of active infection at this time. No fevers, chills, nausea, vomiting, or diarrhea. He does tell me that he actually got in touch with [employer] and they feel like they can add diagnosis codes to his orders in order to get an external brace for him as opposed to the one that goes internal to issue. That would be excellent for his foot drop and I did tell him that if there is anything I need to fill out in that regard I will be  more than happy to oblige and fill out what is needed for [employer]. 05/14/2019 upon evaluation today patient's original wound site actually appears to be doing much better which is good news. Fortunately there is no signs of active infection  at this time. No fevers, chills, nausea, vomiting, or diarrhea. With that being said the patient unfortunately is continuing to have issues with rubbing he tells me that the felt slid on his foot although he did not realize it until today. Nonetheless I think this did cause a blister around the edge of the wound and he does an open wound that is slightly larger overall in measurement compared to last time mainly due to this blister. Other than that I feel like he is doing quite well. 05/21/2019 upon evaluation today patient appears to be doing excellent in regard to his wound at this time on the plantar foot. He is showing signs of improvement he does have some slough and biofilm noted on the surface of the wound this is going require some sharp debridement today as well. Fortunately there is no evidence of active infection. No fevers, chills, nausea, vomiting, or diarrhea. 05/28/2019 on evaluation today patient's wound actually appears to be measuring smaller upon initial inspection and I do believe that it is doing well. With that being said he continues to have a blister on the outside/lateral edge of the wound that occurs as a result of rubbing due to his dropfoot currently. Subsequently we have filled out the paperwork for his new external brace but again that does take several weeks to get that completed once everything is returned to back we did send this back last week as far as the paperwork was concerned. With that being said there does not appear to be any signs of active infection at this time. 06/04/2019 on evaluation today patient's wound actually appears to be doing slightly better compared to last week which is good news. He still states that his  insurance unfortunately does not seem to want to cover any of his new brace. He is going to just pay for this himself. Nonetheless he is going by there today when he leaves here that is [employer] in [place] that is going to be making this brace for him. 06/18/2019 upon evaluation today patient fortunately does have his new brace which is the external AFO brace with bilateral uprights. Subsequently he seems to be doing much better in fact the wound already appears better. He has not even been using the felt he is just using a dressing over Pierce ([id number]) top of the wound and overall I am very pleased with how things seem to be progressing. There is no signs of active infection at this time which is great news and I think that the AFO is good to help him tremendously as far as trying to keep pressure off of the 06/25/2019 upon evaluation today patient seems to be making good progress in regard to his wound. He has been tolerating the dressing changes without complication. Fortunately there is no signs of active infection at this time. No fevers, chills, nausea, vomiting, or diarrhea. 07/02/2019 upon evaluation today patient unfortunately had a blistered area on the bottom of his foot tracking more towards the midline of the plantar aspect. He does not seem to have issues with rubbing more laterally that he used to have but nonetheless this is still giving him some trouble and causing problems here. Fortunately there is no signs of active infection at this time. No fevers, chills, nausea, vomiting, or diarrhea. 07/09/2019 upon evaluation today patient appears to be doing well with regard to his plantar foot ulcer. Definitely this is much improved compared to last week's visit which again last week the  issue there was that he had an area of callus buildup on the plantar aspect of his foot I was able to clean this away and after effectively doing so the wound seems to be doing much better which  is great news. There is no signs of active infection at this time. 07/16/2019 upon evaluation today patient appears to be making some progress in my opinion with regard to his plantar foot ulcer. He has been tolerating the dressing changes without complication. Fortunately there is no evidence of active infection at this time which is great news. 07/23/2019 upon evaluation today patient appears to be doing well with regard to his wounds on the plantar foot. He has been tolerating the dressing changes without complication. These do seem to be healing. 08/06/2019 upon evaluation today patient's wound actually appears to be showing some signs of improvement though he still has a significant amount of callus buildup over this location. That is can require some debridement today. With that being said the actual wound openings seem to be dramatically improved which is great news. There is no signs of active infection at this time. No fevers, chills, nausea, vomiting, or diarrhea. 08/13/2019 upon evaluation today patient appears to be doing well with regard to his foot ulcer. He has been tolerating the dressing changes without complication. Fortunately there is no signs of active infection at this time. No fevers, chills, nausea, vomiting, or diarrhea. 08/20/2019 upon evaluation today patient appears to be doing excellent in regard to his foot ulcer. I do believe he still showing signs of improvement this is very slow but again I think still pressure getting to the area even with his brace and custom shoe is still part of the main issue with the delay here. Nonetheless I do not see any signs of worsening or infection all of which is good news. 5/20; left lateral foot wound. He wears an AFO brace for foot drop related to his diabetes. The wound is small and clean looking he is using silver collagen. We have apparently tried him in offloading shoe he could not tolerate this. He does not have good balance. 09/11/2019  upon evaluation today patient appears to be doing more poorly in regard to his foot ulcer as compared to last time I saw him. Unfortunately he does tell me as we discussed on Friday that he had pus and blood coming from the area underneath the callus. Unfortunately I think that this is a combination of the callus not being trimmed away enough and potentially the felt pad causing an abnormal rubbing which has happened before which led to this becoming more irritated and likely infected based on what I am seeing today. The patient takes ampicillin all the time. Subsequently he also states that he has been trying for a couple of days to take the clindamycin of which she had some leftover from previous. With that being said it upset his stomach. The antibiotic that I sent in for him on Friday unfortunately I wrote the wrong number on as far as the number of pills that he needed. With that being said I corrected that this morning but he tells me is not can pick it up as that seems to be making him have trouble with his stomach being upset at this point. Fortunately there is no signs of active infection at this time systemically. 09/17/2019 upon evaluation today patient appears to be doing better in regard to his foot ulcer. Has been taking ampicillin that he had  leftover from a previous prescription. With that being said he did go see his doctor today concerning his diabetes and notes that his hemoglobin A1c was 13 obviously this is elevated and there can be working on that. Also did review the culture result that we finally got back for final well that showed rare Pseudomonas and a few group B strep noted. The Pseudomonas was sensitive to Cipro but again the wound is doing so much better with the ampicillin I think this is probably not a causative organism I do not see anything that really has me concerned as far as infection is concerned today. Therefore I would hold off on prescribing Cipro for the patient  and have him discontinue the ampicillin until completion. 09/24/2019 upon evaluation today patient appears to be doing 09/24/2019 upon evaluation today patient appears to be doing well with regard to his foot ulcer. He actually has not enlarged any and seems to be measuring smaller quite significantly at this point. His toe ulcer is also doing somewhat better which is good news. Fortunately there is no evidence of active infection at this time. No fevers, chills, nausea, vomiting, or diarrhea. 10/08/2019 upon evaluation today patient appears to be doing well with regard to his foot ulcer all things considered. Fortunately there is no signs of active infection at this time. No fevers, chills, nausea, vomiting, or diarrhea. Fortunately he states that overall he has been doing much better with regard to not have any blistering on the side of his foot. Unfortunately he did have a fall last night due to the fact that he got up to walk without his brace on and did not have his walker either it was around 4 in the morning and his mother was calling out. Subsequently he states that the foot drop got him and he ended up following. Nonetheless he states he normally uses his walker I explained he needs to try to make sure not to ever go without the walker. No matter how attempting the situation may be. Readmission: 12/03/2019 upon evaluation today patient presents for follow-up after having had surgery last week on his foot for surgical debridement and then subsequent wound VAC placement. He had gone into the hospital as he was having a significant infection and not feeling well. He is unfortunately since I last saw him also been under the care of the cancer doctors and they have been taking good care of him. He does seem to be in fairly high spirits all things considered. With that being said he has lung cancer he tells me that his back from the radiation feels like it is constantly burned. 12/10/2019 on evaluation  today patient appears to be doing well with regard to his wound. I do believe the wound VAC is doing a great job at helping to manage his drainage and overall health and to granulate in the wound. He has less undermining noted today in fact almost none. Home health is coming out to change the wound VAC on a regular basis Monday, Wednesday, and Friday. 12/24/2019 on evaluation today patient appears to be doing fairly well in regard to the wound on the plantar aspect of his foot. With that being said he does have a significant feeling in of what has been going on with regard to the ulceration which does not have the depth that it did in the past. With that being said I do believe that based on what I am seeing at this point the patient likely  does need to take a break from the wound VAC and in fact it appears the wound VAC was actually placed directly on the skin as far as the bridging was concerned causing some irritation of the top of the patient's foot. This has me concerned as well that if were not careful that could worsen. I do believe a break with the wound VAC however will be fine and then we can see where we go from there. [name], [name] ([id number]) 01/07/2020 upon evaluation today patient actually appears to be doing much better in regard to his wound. He has been tolerating the dressing changes without complication. There is no signs of active infection and overall I feel like he is managing quite nicely. I do believe that we would likely discontinue the [facility] altogether today. 10/13; follow-up visit. Patient has a area on the left lateral plantar foot. He has been using Hydrofera Blue and bordered foam he has home health changing this 3 times a week. He is nonambulatory. He avoids pushing the wheelchair with that part of his foot and instead uses his heel. We have had a nice improvement today and overall surface area 10/27; 2-week follow-up. He has been using Hydrofera Blue and bordered foam  on a wound on the left lateral foot. He told me he was nonambulatory last time from foot drop although he walked in the clinic with a walker this week apparently did not have any transportation. Also he has a new wound on the right dorsal medial foot which she says was caused by foot where trauma 11/10; 2-week follow-up. We have been using Hydrofera Blue on the left lateral foot roughly at the base of the fifth metatarsal. He came in last visit with a punched out area on the right dorsal foot. He says this was foot where trauma. I reiterated that he is not wearing his previous braces and he is in. He spends most of his time in his wheelchair. He says he is careful not to be pushing himself around in the wheelchair with his feet. He is wearing regular tennis shoes. I taken a bit of time to review the history of the wound on the left foot. This actually was a chronic wound for him. Episodic infections. He had arterial studies done in August these were normal. ABI in the right of 1.05, TBI at 0.65 with triphasic waveforms on the left 1.01 with a TBI of 0.78 and again triphasic waveforms. Does not appear to be a arterial issue 11/17 we brought the patient in today to look at the area on the right dorsal foot and the left lateral plantar foot. The left was the original wound. Patient states the right dorsal foot happened from trauma from his brace. He is no longer using this. He is mostly in surgical shoes that we provide except when he goes out driving. We changed him to silver collagen last week. He does not have an arterial issue He came to the clinic today got out of his car went to the back of his car felt presyncopal and fell down. When we got out there to help him up he was alert conversational. Blood pressure little reduced at 725 systolic: We got him in the exam room. Otherwise he looks fine 03/21/2020 upon evaluation today patient appears to be doing decently well in regard to his ulcers on the  bilateral feet. Fortunately there is no signs of active infection at this time which is great news. No fever chills  noted. He has been tolerating the dressing changes without complication. Fortunately there is no sign of active infection at this time. No fevers, chills, nausea, vomiting, or diarrhea. 04/04/2020 upon evaluation today patient's wounds actually are showing signs of improvement at both locations. There is good be some sharp debridement necessary on the plantar foot but overall not much compared to where things have been in the past. Overall I feel like he is doing quite well 04/18/2020 on evaluation today patient appears to be doing well with regard to his wounds currently. Fortunately there is no signs of active infection at this time. No fevers, chills, nausea, vomiting, or diarrhea. With that being said the patient is continuing to experience some issues with open wound on the right foot on the left though this is not draining it does appear to show some signs of callus currently. 05/02/2020 upon evaluation today patient appears to be doing excellent. His left foot is healed the right foot is actually showing signs of improvement and significantly smaller it still is not completely closed however. In general I am very pleased with where things stand currently. Electronic Signature(s) Signed: 05/02/2020 4:02:53 PM By: Worthy Keeler [name] Entered By: Worthy Keeler on 05/02/2020 16:02:53 [name] ([id number]) -------------------------------------------------------------------------------- Physical Exam Details Patient Name: [name] Date of Service: 05/02/2020 12:45 PM Medical Record Number: [id number] Patient Account Number: [id number] Date of Birth/Sex: [birth year] (74 y.o. M) Treating RN: Cornell Barman Primary Care Provider: [name] Other Clinician: Referring Provider: [name] Treating Provider/Extender: Skipper Cliche in Treatment:  21 Constitutional Well-nourished and well-hydrated in no acute distress. Respiratory normal breathing without difficulty. Psychiatric this patient is able to make decisions and demonstrates good insight into disease process. Alert and Oriented x 3. pleasant and cooperative. Notes Upon inspection patient's wound bed actually showed signs of good epithelization on the left foot and this in fact appears to be completely closed. On the right foot he has good epithelization and granulation no sharp debridement was necessary and I do believe the collagen is doing a good job. Electronic Signature(s) Signed: 05/02/2020 4:03:23 PM By: Worthy Keeler [name] Entered By: Worthy Keeler on 05/02/2020 16:03:22 [name] ([id number]) -------------------------------------------------------------------------------- Physician Orders Details Patient Name: [name] Date of Service: 05/02/2020 12:45 PM Medical Record Number: [id number] Patient Account Number: [id number] Date of Birth/Sex: 07-Apr-1946 (74 y.o. M) Treating RN: Carlene Coria Primary Care Provider: [name] Other Clinician: Referring Provider: [name] Treating Provider/Extender: Skipper Cliche in Treatment: 21 Verbal / Phone Orders: No Diagnosis Coding ICD-10 Coding Code Description E11.621 Type 2 diabetes mellitus with foot ulcer L97.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scle L97.512 Non-pressure chronic ulcer of other part of right foot with fat layer exposed E11.42 Type 2 diabetes mellitus with diabetic polyneuropathy M21.372 Foot drop, left foot Follow-up Appointments o Return Appointment in 1 week. Home Health o DISCONTINUE Home Health for Wound Care. - [facility] Wound Treatment Wound #9 - Foot Wound Laterality: Dorsal, Right Cleanser: Normal Saline 3 x Per Week/30 Days Discharge Instructions: Wash your hands with soap and water. Remove old dressing,  discard into plastic bag and place into trash. Cleanse the wound with Normal Saline prior to applying a clean dressing using gauze sponges, not tissues or cotton balls. Do not scrub or use excessive force. Pat dry using gauze sponges, not tissue or cotton balls. Primary Dressing: Prisma 4.34 (in) 3 x Per Week/30 Days Discharge Instructions:  Moisten w/normal saline or sterile water; Cover wound as directed. Do not remove from wound bed. Secondary Dressing: Bordered Gauze Sterile-HBD 4x4 (in/in) (Generic) 3 x Per Week/30 Days Discharge Instructions: Cover wound with Bordered Guaze Sterile as directed Electronic Signature(s) Signed: 05/02/2020 4:53:43 PM By: Worthy Keeler [name] Signed: 05/08/2020 11:33:31 AM By: Carlene Coria RN Entered By: Carlene Coria on 05/02/2020 13:38:51 [name] ([id number]) -------------------------------------------------------------------------------- Problem List Details Patient Name: [name] Date of Service: 05/02/2020 12:45 PM Medical Record Number: [id number] Patient Account Number: [id number] Date of Birth/Sex: June 11, 1945 (74 y.o. 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 12/03/2019 No Yes muscle L97.512 Non-pressure chronic ulcer of other part of right foot with fat layer 02/06/2020 No Yes exposed E11.42 Type 2 diabetes mellitus with diabetic polyneuropathy 12/03/2019 No Yes M21.372 Foot drop, left foot 12/03/2019 No Yes Inactive Problems Resolved Problems Electronic Signature(s) Signed: 05/02/2020 1:33:11 PM By: Worthy Keeler [name] Entered By: Worthy Keeler on 05/02/2020  13:33:11 [name] ([id number]) -------------------------------------------------------------------------------- Progress Note Details Patient Name: [name] Date of Service: 05/02/2020 12:45 PM Medical Record Number: [id number] Patient Account Number: [id number] Date of Birth/Sex: 11/28/1945 (74 y.o. M) Treating RN: Cornell Barman Primary Care Provider: [name] Other Clinician: Referring Provider: [name] Treating Provider/Extender: Skipper Cliche in Treatment: 21 Subjective Chief Complaint Information obtained from Patient Left plantar foot ulcer s/p surgical debridement and wound VAC placement. Left great toe ulcer. History of Present Illness (HPI) 10/04/16 on evaluation today patient presents for initial evaluation concerning a laceration of the right wrist and hand on the bowler aspect. He tells me that he fell into glass following a medication change in regard to his diabetes. He initially went to [facility] where she was sutured and subsequently his daughter who is a Marine scientist removed the teachers at the appropriate time. Unfortunately the wound has done poorly and dehissed which has caused him some trouble including infection. He has been on antibiotics both topically and orally though the wound continues to show signs of necrotic tissue according to notes which is what he was sent to [place] for evaluation concerning. He does have diabetes and unfortunately this is severely uncontrolled. He does see an endocrinologist and is working on this. 10/11/2016 -- the patient's notes from the ER were reviewed and he had his suturing done on 09/17/2016 and had necrotic skin flaps which had to be trimmed the last visit he was here. He has managed to get his Santyl ointment and has been using this appropriately. 10/21/16 patient's wound appears to be doing very well on evaluation today. He still has some Slough covering the wound bed but this is nowhere near as severe  as when i saw this initially two weeks ago nor even my review of his pictures last week. I'm pleased with how this is progressing. Patient's wound over the right form appears to be doing fairly well on evaluation today. There is no evidence of infection and he has a small area at the crease of where she is wrist extends and flexes that is still open and appears to be having difficulty due to the fact that it is cracking open. I think this  is something that may be controlled with moisture control that is keeping the wound a little bit more moist. Nonetheless this is a tiny region compared to the initial wound and overall appears to be doing very well. No fevers, chills, nausea, or vomiting noted at this time. 11/25/16 on evaluation today patient appears to be doing well in regard to his right wrist ulcer. In fact this appears to be completely healed he is having no discomfort. He is pleased with how this is progressed. Readmission: 06/24/17 on evaluation today patient appears back in our clinic regarding issues that he has been having with his right ankle on the lateral malleolus as well is in the dorsal surface of his left foot. Both have been going on for several weeks unfortunately. He tells me that initially this was being managed by Dr. Edilia Bo and I did review the notes from Dr. Edilia Bo in epic. Patient was placed on amoxicillin initially and then subsequently Keflex following. He has been using triple antibiotic ointment over-the-counter along with a Band-Aid for the wound currently. He did not have any Santyl remaining. His most recent hemoglobin A1c which was just two days ago was 10.3. Fortunately he seems to have excellent blood flow in the bilateral lower extremities with his left ABI being 0.95 in the right ABI 1.01. Patient does have some pain in regard to each of these ulcerations but he tells me it's not nearly as severe as the hand wound that I help treat earlier over the summer. This  was in 2018. 07/15/17 on evaluation today patient's lower surety ulcer is actually both appear to be doing fairly well at this point. He does have a little bit of skin overlapping the ulcer on the right lateral ankle this was debrided away today just with saline and gauze without complication patient had no discomfort or pain. Otherwise patient's left foot ulcer does appear to be showing signs of improvement he does seem to have a right great toe. Nikki and noted at this point in time. I think that this does need to be managed at this point. No fevers, chills, nausea, or vomiting noted at this time. 07/22/17 on evaluation today patient's wounds in regard to his lower extremities appear to be doing somewhat better. His right ankle wound in fact appears to likely be completely closed. The left dorsal foot ulcer though still open does appear to be a little bit smaller and does also seem to be making good progress. He has not been having problems with the treatment at this point in the general he does not seem to show as much erythema surrounding the wound bed which is good news. He is not having significant discomfort. 08/12/17 on evaluation today patients wounds of both appeared to be doing better in fact they both appear to be completely healed. Overall I'm very pleased with how things have progressed over the past week even more than what I expected. Obviously this is good news and he is happy about this. He did have a fall when going to his orthopedic doctor recently fortunately he does not appear to have injured anything severely but nonetheless does have a couple of bumps and bruises nothing significant at this time but he needs to be seen and wound care for. He may have broken his left wrist he is following up with orthopedics in that regard. Readmission: 10/24/17 on evaluation today patient presents for reevaluation after having been discharged Aug 12, 2017. His wound was healed at that  point although  he states that it has been somewhat discolored since that time. He has not experienced the drainage as far as the wound is concerned but the fact that it was still discolored been greater than two months after the fact made him want to come in for reevaluation. Upon inspection today the patient has no pain although he does have neuropathy. He does have a discolored region of the dorsal surface of the left foot which was the site where the ulcer was that we previously treated. The good news is he does not again have any discomfort although obviously I do believe this may be some scar tissue and just discoloration in that regard. Again he hasn't had any drainage or discharge since I last saw him and at this point I see no evidence of that either this area shows complete epithelialization. In general I do not feel like there's any significant issue at this point in that regard. [name], [name] ([id number]) 02/09/2019 This is a 75 year old man with type 2 diabetes poorly controlled with significant peripheral neuropathy. He has been in this clinic before in 2018 with a laceration on his right hand and then again in 2019 with an area over the right lateral malleolus. 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 He states that the wound is been there for the last week or 2. It is been uncomfortable to walk on. The man is an active man having to look after an elderly mother. Past medical history, type 2 diabetes with polyneuropathy with a recent hemoglobin A1c in September 2011 0.3, lumbar disc disease, hypergonadism., Left foot drop and gout. We did not repeat his ABIs in the clinic quoting values from July 2019 of 0.95 and 1.01. He is not felt to have significant PAD 02/19/2019 on evaluation today patient actually appears to be doing better with regard to his foot wound based on what I am seeing currently. He was  seen by Dr. Dellia Nims on readmission back into the clinic last week when I was off. The good news is the patient has been tolerating the dressing changes without any complication in fact has not really had much in the way of drainage. He does have an AFO brace which she believes is actually pushing on this area as well and has contributed to the issue. Fortunately there is no signs of active infection at this point. His ABIs appear to be well. 03/05/2019 on evaluation today patient appears to be doing well in regard to his foot ulcer. He has been tolerating the dressing changes without complication. Fortunately there is no signs of active infection. The wound is measuring somewhat smaller and overall I feel like he is making good progress at this point. 03/12/2019 on evaluation today patient actually appears to be doing quite well with regard to his foot ulcer. The wound is looking better the measurement is not significantly better at this point unfortunately but nonetheless again the overall appearance is improving I think he is making some progress. I still think the big issue here is foot drop and again the patient wants to see if potentially he could get a brace to help with this. Subsequently I think that we can definitely make a referral to [facility] to see if they can help [place] in this regard. He is in agreement with that plan. 03/26/2019 on evaluation today patient appears to  be doing more poorly in regard to his foot ulcer at this time. He did see podiatry in order to see about getting a brace but unfortunately he tells me that Medicare is not likely to pay for one for him as he had 1 I guess 8 months ago. However this one that apparently goes in his shoes that does not seem to be working out well for him. Unfortunately there are signs of active infection at this time. No fevers, chills, nausea, vomiting, or diarrhea. Systemically. The patient has erythema on the dorsal surface of his foot that  tracks from the lateral portion of his foot and again he does have a blister around the wound as well which is worse compared to previous. Overall I am concerned. 04/02/2019 on evaluation today patient appears to be doing about the same with regard to his wound. Unfortunately there is no signs of significant improvement although I do think there is no signs of infection which is good news. His drainage is also slowed down tremendously. I think at this point he would benefit from the initiation of a total contact cast. The only concern I have is that over his balance. I explained to the patient that if we were to initiate the cast I think it would be greatly beneficial for him but he would need to ensure not to walk at all unless he uses a walker he actually has 2 walkers one for inside his home and one that he keeps out in the car. For that reason he would be covered in that regard. I just do not want him to have any accidents and fall with the cast. 12/28-Patient comes in 1 week out a day early for his appointment he was in the hospital for 2 days for uncontrolled hyperglycemia and required management of high blood sugars, with some adjustments in his home regimen of insulin, patient stubbed his right great toe today and put a Band-Aid on it, he is here for his left lateral foot ulcer and we are using silver alginate dressing, he was intact total contact cast with difficulty with balance to the extent that that had to be discontinued 04/16/2019 on evaluation today patient appears to be doing better with regard to the overall appearance of his wound size. This is good news. We had attempted a total contact cast but unfortunately he had some falling issues that the family contacted [place] about we took the cast off we did not put it back on. Nonetheless it ended up that just a couple days later he was in the hospital due to elevations in his blood sugar which went to around 500 and even a little higher.  Subsequently he was admitted to the hospital and was in the hospital for a total of 3 days and that included Christmas day. Fortunately there got his blood sugar under control he tells me that this is running about 200 now which is much better news. There does not appear to be any signs of active infection at this time. No fevers, chills, nausea, vomiting, or diarrhea. 04/30/2019 on evaluation today patient actually appears to be doing slightly worse compared to previous as far as the overall size of his wound is concerned. There also is some need for sharp debridement today as well. Fortunately there is no evidence of active infection which is good news. No fevers, chills, nausea, vomiting, or diarrhea. 05/07/2019 upon evaluation today patient appears to be doing excellent in regard to his plantar foot  ulcer. He has been tolerating the dressing changes without complication. Fortunately there is no signs of active infection at this time. No fevers, chills, nausea, vomiting, or diarrhea. He does tell me that he actually got in touch with [employer] and they feel like they can add diagnosis codes to his orders in order to get an external brace for him as opposed to the one that goes internal to issue. That would be excellent for his foot drop and I did tell him that if there is anything I need to fill out in that regard I will be more than happy to oblige and fill out what is needed for [employer]. 05/14/2019 upon evaluation today patient's original wound site actually appears to be doing much better which is good news. Fortunately there is no signs of active infection at this time. No fevers, chills, nausea, vomiting, or diarrhea. With that being said the patient unfortunately is continuing to have issues with rubbing he tells me that the felt slid on his foot although he did not realize it until today. Nonetheless I think this did cause a blister around the edge of the wound and he does an open wound that is  slightly larger overall in measurement compared to last time mainly due to this blister. Other than that I feel like he is doing quite well. 05/21/2019 upon evaluation today patient appears to be doing excellent in regard to his wound at this time on the plantar foot. He is showing signs of improvement he does have some slough and biofilm noted on the surface of the wound this is going require some sharp debridement today as well. Fortunately there is no evidence of active infection. No fevers, chills, nausea, vomiting, or diarrhea. 05/28/2019 on evaluation today patient's wound actually appears to be measuring smaller upon initial inspection and I do believe that it is doing well. With that being said he continues to have a blister on the outside/lateral edge of the wound that occurs as a result of rubbing due to his dropfoot currently. Subsequently we have filled out the paperwork for his new external brace but again that does take several weeks to get that completed once everything is returned to back we did send this back last week as far as the paperwork was concerned. With that being said there does not appear to be any signs of active infection at this time. 06/04/2019 on evaluation today patient's wound actually appears to be doing slightly better compared to last week which is good news. He still [name], [name] ([id number]) states that his insurance unfortunately does not seem to want to cover any of his new brace. He is going to just pay for this himself. Nonetheless he is going by there today when he leaves here that is [employer] in [place] that is going to be making this brace for him. 06/18/2019 upon evaluation today patient fortunately does have his new brace which is the external AFO brace with bilateral uprights. Subsequently he seems to be doing much better in fact the wound already appears better. He has not even been using the felt he is just using a dressing over top of the wound and  overall I am very pleased with how things seem to be progressing. There is no signs of active infection at this time which is great news and I think that the AFO is good to help him tremendously as far as trying to keep pressure off of the 06/25/2019 upon evaluation today patient seems  to be making good progress in regard to his wound. He has been tolerating the dressing changes without complication. Fortunately there is no signs of active infection at this time. No fevers, chills, nausea, vomiting, or diarrhea. 07/02/2019 upon evaluation today patient unfortunately had a blistered area on the bottom of his foot tracking more towards the midline of the plantar aspect. He does not seem to have issues with rubbing more laterally that he used to have but nonetheless this is still giving him some trouble and causing problems here. Fortunately there is no signs of active infection at this time. No fevers, chills, nausea, vomiting, or diarrhea. 07/09/2019 upon evaluation today patient appears to be doing well with regard to his plantar foot ulcer. Definitely this is much improved compared to last week's visit which again last week the issue there was that he had an area of callus buildup on the plantar aspect of his foot I was able to clean this away and after effectively doing so the wound seems to be doing much better which is great news. There is no signs of active infection at this time. 07/16/2019 upon evaluation today patient appears to be making some progress in my opinion with regard to his plantar foot ulcer. He has been tolerating the dressing changes without complication. Fortunately there is no evidence of active infection at this time which is great news. 07/23/2019 upon evaluation today patient appears to be doing well with regard to his wounds on the plantar foot. He has been tolerating the dressing changes without complication. These do seem to be healing. 08/06/2019 upon evaluation today patient's  wound actually appears to be showing some signs of improvement though he still has a significant amount of callus buildup over this location. That is can require some debridement today. With that being said the actual wound openings seem to be dramatically improved which is great news. There is no signs of active infection at this time. No fevers, chills, nausea, vomiting, or diarrhea. 08/13/2019 upon evaluation today patient appears to be doing well with regard to his foot ulcer. He has been tolerating the dressing changes without complication. Fortunately there is no signs of active infection at this time. No fevers, chills, nausea, vomiting, or diarrhea. 08/20/2019 upon evaluation today patient appears to be doing excellent in regard to his foot ulcer. I do believe he still showing signs of improvement this is very slow but again I think still pressure getting to the area even with his brace and custom shoe is still part of the main issue with the delay here. Nonetheless I do not see any signs of worsening or infection all of which is good news. 5/20; left lateral foot wound. He wears an AFO brace for foot drop related to his diabetes. The wound is small and clean looking he is using silver collagen. We have apparently tried him in offloading shoe he could not tolerate this. He does not have good balance. 09/11/2019 upon evaluation today patient appears to be doing more poorly in regard to his foot ulcer as compared to last time I saw him. Unfortunately he does tell me as we discussed on Friday that he had pus and blood coming from the area underneath the callus. Unfortunately I think that this is a combination of the callus not being trimmed away enough and potentially the felt pad causing an abnormal rubbing which has happened before which led to this becoming more irritated and likely infected based on what I am seeing  today. The patient takes ampicillin all the time. Subsequently he also states that  he has been trying for a couple of days to take the clindamycin of which she had some leftover from previous. With that being said it upset his stomach. The antibiotic that I sent in for him on Friday unfortunately I wrote the wrong number on as far as the number of pills that he needed. With that being said I corrected that this morning but he tells me is not can pick it up as that seems to be making him have trouble with his stomach being upset at this point. Fortunately there is no signs of active infection at this time systemically. 09/17/2019 upon evaluation today patient appears to be doing better in regard to his foot ulcer. Has been taking ampicillin that he had leftover from a previous prescription. With that being said he did go see his doctor today concerning his diabetes and notes that his hemoglobin A1c was 13 obviously this is elevated and there can be working on that. Also did review the culture result that we finally got back for final well that showed rare Pseudomonas and a few group B strep noted. The Pseudomonas was sensitive to Cipro but again the wound is doing so much better with the ampicillin I think this is probably not a causative organism I do not see anything that really has me concerned as far as infection is concerned today. Therefore I would hold off on prescribing Cipro for the patient and have him discontinue the ampicillin until completion. 09/24/2019 upon evaluation today patient appears to be doing 09/24/2019 upon evaluation today patient appears to be doing well with regard to his foot ulcer. He actually has not enlarged any and seems to be measuring smaller quite significantly at this point. His toe ulcer is also doing somewhat better which is good news. Fortunately there is no evidence of active infection at this time. No fevers, chills, nausea, vomiting, or diarrhea. 10/08/2019 upon evaluation today patient appears to be doing well with regard to his foot ulcer all  things considered. Fortunately there is no signs of active infection at this time. No fevers, chills, nausea, vomiting, or diarrhea. Fortunately he states that overall he has been doing much better with regard to not have any blistering on the side of his foot. Unfortunately he did have a fall last night due to the fact that he got up to walk without his brace on and did not have his walker either it was around 4 in the morning and his mother was calling out. Subsequently he states that the foot drop got him and he ended up following. Nonetheless he states he normally uses his walker I explained he needs to try to make sure not to ever go without the walker. No matter how attempting the situation may be. Readmission: 12/03/2019 upon evaluation today patient presents for follow-up after having had surgery last week on his foot for surgical debridement and then subsequent wound VAC placement. He had gone into the hospital as he was having a significant infection and not feeling well. He is unfortunately since I last saw him also been under the care of the cancer doctors and they have been taking good care of him. He does seem to be in fairly high spirits all things considered. With that being said he has lung cancer he tells me that his back from the radiation feels like it is constantly burned. 12/10/2019 on evaluation today patient appears  to be doing well with regard to his wound. I do believe the wound VAC is doing a great job at helping to manage his drainage and overall health and to granulate in the wound. He has less undermining noted today in fact almost none. Home health is coming out to change the wound VAC on a regular basis Monday, Wednesday, and Friday. 12/24/2019 on evaluation today patient appears to be doing fairly well in regard to the wound on the plantar aspect of his foot. With that being said he does have a significant feeling in of what has been going on with regard to the ulceration  which does not have the depth that it did in the [name], [name] ([id number]) past. With that being said I do believe that based on what I am seeing at this point the patient likely does need to take a break from the wound VAC and in fact it appears the wound VAC was actually placed directly on the skin as far as the bridging was concerned causing some irritation of the top of the patient's foot. This has me concerned as well that if were not careful that could worsen. I do believe a break with the wound VAC however will be fine and then we can see where we go from there. 01/07/2020 upon evaluation today patient actually appears to be doing much better in regard to his wound. He has been tolerating the dressing changes without complication. There is no signs of active infection and overall I feel like he is managing quite nicely. I do believe that we would likely discontinue the [facility] altogether today. 10/13; follow-up visit. Patient has a area on the left lateral plantar foot. He has been using Hydrofera Blue and bordered foam he has home health changing this 3 times a week. He is nonambulatory. He avoids pushing the wheelchair with that part of his foot and instead uses his heel. We have had a nice improvement today and overall surface area 10/27; 2-week follow-up. He has been using Hydrofera Blue and bordered foam on a wound on the left lateral foot. He told me he was nonambulatory last time from foot drop although he walked in the clinic with a walker this week apparently did not have any transportation. Also he has a new wound on the right dorsal medial foot which she says was caused by foot where trauma 11/10; 2-week follow-up. We have been using Hydrofera Blue on the left lateral foot roughly at the base of the fifth metatarsal. He came in last visit with a punched out area on the right dorsal foot. He says this was foot where trauma. I reiterated that he is not wearing his previous braces and  he is in. He spends most of his time in his wheelchair. He says he is careful not to be pushing himself around in the wheelchair with his feet. He is wearing regular tennis shoes. I taken a bit of time to review the history of the wound on the left foot. This actually was a chronic wound for him. Episodic infections. He had arterial studies done in August these were normal. ABI in the right of 1.05, TBI at 0.65 with triphasic waveforms on the left 1.01 with a TBI of 0.78 and again triphasic waveforms. Does not appear to be a arterial issue 11/17 we brought the patient in today to look at the area on the right dorsal foot and the left lateral plantar foot. The left was the original wound.  Patient states the right dorsal foot happened from trauma from his brace. He is no longer using this. He is mostly in surgical shoes that we provide except when he goes out driving. We changed him to silver collagen last week. He does not have an arterial issue He came to the clinic today got out of his car went to the back of his car felt presyncopal and fell down. When we got out there to help him up he was alert conversational. Blood pressure little reduced at 628 systolic: We got him in the exam room. Otherwise he looks fine 03/21/2020 upon evaluation today patient appears to be doing decently well in regard to his ulcers on the bilateral feet. Fortunately there is no signs of active infection at this time which is great news. No fever chills noted. He has been tolerating the dressing changes without complication. Fortunately there is no sign of active infection at this time. No fevers, chills, nausea, vomiting, or diarrhea. 04/04/2020 upon evaluation today patient's wounds actually are showing signs of improvement at both locations. There is good be some sharp debridement necessary on the plantar foot but overall not much compared to where things have been in the past. Overall I feel like he is doing quite  well 04/18/2020 on evaluation today patient appears to be doing well with regard to his wounds currently. Fortunately there is no signs of active infection at this time. No fevers, chills, nausea, vomiting, or diarrhea. With that being said the patient is continuing to experience some issues with open wound on the right foot on the left though this is not draining it does appear to show some signs of callus currently. 05/02/2020 upon evaluation today patient appears to be doing excellent. His left foot is healed the right foot is actually showing signs of improvement and significantly smaller it still is not completely closed however. In general I am very pleased with where things stand currently. Objective Constitutional Well-nourished and well-hydrated in no acute distress. Vitals Time Taken: 1:12 PM, Height: 71 in, Weight: 190 lbs, BMI: 26.5, Temperature: 98.1 F, Pulse: 52 bpm, Respiratory Rate: 18 breaths/min, Blood Pressure: 100/59 mmHg. Respiratory normal breathing without difficulty. Psychiatric this patient is able to make decisions and demonstrates good insight into disease process. Alert and Oriented x 3. pleasant and cooperative. General Notes: Upon inspection patient's wound bed actually showed signs of good epithelization on the left foot and this in fact appears to be completely closed. On the right foot he has good epithelization and granulation no sharp debridement was necessary and I do believe the collagen is doing a good job. Integumentary (Hair, Skin) Wound #7 status is Open. Original cause of wound was Surgical Injury. The wound is located on the [place]. The wound measures 0cm length x 0cm width x 0cm depth; 0cm^2 area and 0cm^3 volume. There is no tunneling or undermining noted. There is a none present amount of drainage noted. The wound margin is thickened. There is no granulation within the wound bed. There is no necrotic tissue within the wound  bed. [name], [name] ([id number]) Wound #9 status is Open. Original cause of wound was Pressure Injury. The wound is located on the Right,Dorsal Foot. The wound measures 0.5cm length x 0.3cm width x 0.2cm depth; 0.118cm^2 area and 0.024cm^3 volume. There is Fat Layer (Subcutaneous Tissue) exposed. There is no tunneling or undermining noted. There is a medium amount of serosanguineous drainage noted. The wound margin is flat and intact. There is  large (67-100%) granulation within the wound bed. There is no necrotic tissue within the wound bed. 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 Home Health: DISCONTINUE Home Health for Wound Care. - [facility] WOUND #9: - Foot Wound Laterality: Dorsal, Right Cleanser: Normal Saline 3 x Per Week/30 Days Discharge Instructions: Wash your hands with soap and water. Remove old dressing, discard into plastic bag and place into trash. Cleanse the wound with Normal Saline prior to applying a clean dressing using gauze sponges, not tissues or cotton balls. Do not scrub or use excessive force. Pat dry using gauze sponges, not tissue or cotton balls. Primary Dressing: Prisma 4.34 (in) 3 x Per Week/30 Days Discharge Instructions: Moisten w/normal saline or sterile water; Cover wound as directed. Do not remove from wound bed. Secondary Dressing: Bordered Gauze Sterile-HBD 4x4 (in/in) (Generic) 3 x Per Week/30 Days Discharge Instructions: Cover wound with Bordered Guaze Sterile as directed 1. I would recommend currently that we going continue with the wound care measures as before and the patient is in agreement with the plan this includes the  use of the silver collagen dressing which is doing an excellent job. That is for the right foot. 2. Also can recommend that he continue to stay off of his left foot for the next week after that I am going to recently see him back to being able to get into a walking routine on his elliptical try to build up some strength. 3. I am also going to suggest that he continue to monitor for anything worsening if he has any issues he should let me know as soon as possible. We will see patient back for reevaluation in 1 week here in the clinic. If anything worsens or changes patient will contact our office for additional recommendations. Electronic Signature(s) Signed: 05/02/2020 4:03:46 PM By: Worthy Keeler [name] Entered By: Worthy Keeler on 05/02/2020 16:03:46 [name] ([id number]) -------------------------------------------------------------------------------- SuperBill Details Patient Name: [name] Date of Service: 05/02/2020 Medical Record Number: [id number] Patient Account Number: [id number] Date of Birth/Sex: 1945-10-18 (74 y.o. M) Treating RN: Carlene Coria Primary Care Provider: [name] Other Clinician: Referring Provider: [name] Treating Provider/Extender: Skipper Cliche in Treatment: 21 Diagnosis Coding ICD-10 Codes Code Description E11.621 Type 2 diabetes mellitus with foot ulcer L97.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scle L97.512 Non-pressure chronic ulcer of other part of right foot with fat layer exposed E11.42 Type 2 diabetes mellitus with diabetic polyneuropathy M21.372 Foot drop, left foot Facility Procedures CPT4 Code: [id number] Description: 99213 - WOUND CARE VISIT-LEV 3 EST PT Modifier: Quantity: 1 Physician Procedures CPT4 Code: 6720947 Description: 99213 - WC PHYS LEVEL 3 - EST PT Modifier: Quantity: 1 CPT4 Code: Description: ICD-10 Diagnosis Description E11.621 Type 2 diabetes mellitus  with foot ulcer L97.512 Non-pressure chronic ulcer of other part of right foot with fat layer e L97.523 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  E11.42 Type 2 diabetes mellitus with diabetic polyneuropathy Modifier: xposed muscle Quantity: Electronic Signature(s) Signed: 05/02/2020 4:03:58 PM By: Worthy Keeler [name] Entered By: Worthy Keeler on 05/02/2020 16:03:57

## 2020-05-06 ENCOUNTER — Telehealth: Payer: Self-pay

## 2020-05-06 DIAGNOSIS — Z794 Long term (current) use of insulin: Secondary | ICD-10-CM | POA: Diagnosis not present

## 2020-05-06 DIAGNOSIS — E1142 Type 2 diabetes mellitus with diabetic polyneuropathy: Secondary | ICD-10-CM | POA: Diagnosis not present

## 2020-05-06 DIAGNOSIS — G4733 Obstructive sleep apnea (adult) (pediatric): Secondary | ICD-10-CM | POA: Diagnosis not present

## 2020-05-06 DIAGNOSIS — E11621 Type 2 diabetes mellitus with foot ulcer: Secondary | ICD-10-CM | POA: Diagnosis not present

## 2020-05-06 DIAGNOSIS — J45909 Unspecified asthma, uncomplicated: Secondary | ICD-10-CM | POA: Diagnosis not present

## 2020-05-06 DIAGNOSIS — I4892 Unspecified atrial flutter: Secondary | ICD-10-CM | POA: Diagnosis not present

## 2020-05-06 DIAGNOSIS — E1122 Type 2 diabetes mellitus with diabetic chronic kidney disease: Secondary | ICD-10-CM | POA: Diagnosis not present

## 2020-05-06 DIAGNOSIS — R000 Tachycardia, unspecified: Secondary | ICD-10-CM | POA: Diagnosis not present

## 2020-05-06 DIAGNOSIS — C3491 Malignant neoplasm of unspecified part of right bronchus or lung: Secondary | ICD-10-CM | POA: Diagnosis not present

## 2020-05-06 DIAGNOSIS — L97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scle: Secondary | ICD-10-CM | POA: Diagnosis not present

## 2020-05-06 DIAGNOSIS — I129 Hypertensive chronic kidney disease with stage 1 through stage 4 chronic kidney disease, or unspecified chronic kidney disease: Secondary | ICD-10-CM | POA: Diagnosis not present

## 2020-05-06 DIAGNOSIS — N1831 Chronic kidney disease, stage 3a: Secondary | ICD-10-CM | POA: Diagnosis not present

## 2020-05-06 DIAGNOSIS — I480 Paroxysmal atrial fibrillation: Secondary | ICD-10-CM | POA: Diagnosis not present

## 2020-05-06 DIAGNOSIS — E041 Nontoxic single thyroid nodule: Secondary | ICD-10-CM | POA: Diagnosis not present

## 2020-05-06 DIAGNOSIS — D61810 Antineoplastic chemotherapy induced pancytopenia: Secondary | ICD-10-CM | POA: Diagnosis not present

## 2020-05-06 DIAGNOSIS — Z87891 Personal history of nicotine dependence: Secondary | ICD-10-CM | POA: Diagnosis not present

## 2020-05-06 DIAGNOSIS — Z9181 History of falling: Secondary | ICD-10-CM | POA: Diagnosis not present

## 2020-05-06 DIAGNOSIS — L97512 Non-pressure chronic ulcer of other part of right foot with fat layer exposed: Secondary | ICD-10-CM | POA: Diagnosis not present

## 2020-05-06 NOTE — Chronic Care Management (AMB) (Signed)
Chronic Care Management Pharmacy Assistant   Name: [name]  [name]: [id number] DOB: 25-Jun-1945  Reason for Encounter: Schedule follow up with CCM  Patient Questions:  1.  Have you seen any other providers since your last visit? yes 05/02/20- Jeri Cos, [name]- Wound care 04/18/20- Jeri Cos, [name]- Wound care 04/07/20- Curt Bears, MD- Oncology 04/04/20- Jeri Cos, [name]- Wound care 04/02/20- [name], MD- PCP 03/24/20- ED visit- Tachycardia 03/24/20- [name], MD- PCP 03/21/20- Jeri Cos, [name]- Wound care   2.  Any changes in your medicines or health? No   PCP : [name], MD  Allergies:   Allergies  Allergen Reactions  . Tetracycline Itching and Rash    Medications: Outpatient Encounter Medications as of 05/06/2020  Medication Sig Note  . acetaminophen (TYLENOL) 650 MG CR tablet Take 650 mg by mouth every 8 (eight) hours as needed for pain.   [name] Kitchen albuterol (VENTOLIN HFA) 108 (90 Base) MCG/ACT inhaler Inhale 1-2 puffs into the lungs every 6 (six) hours as needed for wheezing or shortness of breath.   . Ascorbic Acid (VITAMIN C) 1000 MG tablet Take 1,000 mg by mouth daily.   . cholecalciferol (VITAMIN D) 1000 UNITS tablet Take 2,000 Units by mouth daily.   [name] Kitchen diltiazem (CARDIZEM CD) 240 MG 24 hr capsule Take 1 capsule (240 mg total) by mouth daily.   [name] Kitchen donepezil (ARICEPT) 10 MG tablet TAKE 1 TABLET BY MOUTH AT BEDTIME (Patient taking differently: Take 10 mg by mouth at bedtime.)   . fluticasone (FLONASE) 50 MCG/ACT nasal spray PLACE 2 SPRAYS IN EACH NOSTRIL DAILY (Patient taking differently: Place 1 spray into both nostrils daily.)   . guaiFENesin (MUCINEX) 600 MG 12 hr tablet Take 600 mg by mouth daily as needed for cough.   . Hyprom-Naphaz-Polysorb-Zn Sulf (CLEAR EYES COMPLETE OP) Place 1 drop into both eyes daily as needed (dry eyes).   . Insulin Pen Needle (PEN NEEDLES) 31G X 5 MM MISC 1 each by Does not apply route 3 (three) times daily. To  inject insulin E11.42   . insulin regular human CONCENTRATED (HUMULIN R U-500 KWIKPEN) 500 UNIT/ML kwikpen Inject 40 Units into the skin with breakfast, with lunch, and with evening meal. 03/11/2020: Took 50 units today 03-11-20  . memantine (NAMENDA) 10 MG tablet TAKE 1 TABLET BY MOUTH TWICE A DAY (Patient taking differently: Take 10 mg by mouth 2 (two) times daily.)   . montelukast (SINGULAIR) 10 MG tablet Take 1 tablet (10 mg total) by mouth at bedtime.   . pantoprazole (PROTONIX) 20 MG tablet Take 1 tablet (20 mg total) by mouth daily.   . prochlorperazine (COMPAZINE) 10 MG tablet Take 1 tablet (10 mg total) by mouth every 6 (six) hours as needed. (Patient taking differently: Take 10 mg by mouth every 6 (six) hours as needed for nausea or vomiting.)   . Semaglutide (OZEMPIC, 0.25 OR 0.5 MG/DOSE, [employer]) Inject 0.25 mg into the skin once a week. Sundays   . simvastatin (ZOCOR) 40 MG tablet TAKE 1 TABLET BY MOUTH AT BEDTIME (Patient taking differently: Take 40 mg by mouth daily at 6 PM.)   . venlafaxine XR (EFFEXOR-XR) 75 MG 24 hr capsule TAKE 1 CAPSULE BY MOUTH EVERY MORNING AND 2 CAPSULES AT BEDTIME (Patient taking differently: Take 75 mg by mouth See admin instructions. Takes 1 capsule in the morning and 2 capsules at night.)    No facility-administered encounter medications on file as of 05/06/2020.    Current Diagnosis:  Patient Active Problem List   Diagnosis Date Noted  . Prolonged QT interval 03/25/2020  . Atrial fibrillation with RVR ([place]) 03/24/2020  . Secondary hypercoagulable state ([place]) 03/19/2020  . Typical atrial flutter ([place]) 03/11/2020  . Antineoplastic chemotherapy induced pancytopenia ([place]) 03/11/2020  . Severe protein-calorie malnutrition ([place]) 02/12/2020  . Chemotherapy induced neutropenia ([place])   . Severe sepsis ([place]) 11/22/2019  . Neutropenic fever ([place]) 11/22/2019  . Non-small cell carcinoma of right lung, stage 3 ([place]) 10/23/2019  . Goals of care, counseling/discussion  10/23/2019  . Encounter for antineoplastic chemotherapy 10/23/2019  . Small cell lung cancer ([place]) 10/12/2019  . Dropfoot 03/15/2019  . Mild cognitive impairment 05/08/2018  . Esophageal dysphagia   . Diabetic foot ulcer ([place]) 06/10/2017  . Uncontrolled type 2 diabetes mellitus with peripheral neuropathy ([place]) 05/11/2016  . Erectile dysfunction associated with type 2 diabetes mellitus ([place]) 05/28/2015  . Former very heavy cigarette smoker (more than 40 per day) 10/07/2014  . Chronic kidney disease, stage III (moderate) ([place]) 12/19/2012  . Hypogonadism male 06/14/2012  . OSA (obstructive sleep apnea) 03/09/2011  . Diabetic neuropathy ([place]) 12/29/2010  . Family history of MI (myocardial infarction) 09/16/2010  . HIP REPLACEMENT, RIGHT, HX OF 02/05/2010  . Hypertriglyceridemia 06/13/2008  . GOUT 06/13/2008  . Major depressive disorder, recurrent episode, in partial remission ([place]) 06/13/2008  . Essential hypertension 06/13/2008  . GERD 06/13/2008  . OSTEOARTHRITIS 06/13/2008    Contacted patient to schedule a follow up with CPP, Debbora Dus, Pharm. D. This was scheduled for 2020/06/17 at 2:15 pm. Patient is aware it will be a phone visit,  Patient states he is doing well. Does not have exact readings for blood sugar but notes it has been "running around 290". He is starting radiation therapy for his lung cancer in the next couple of weeks.     Are you having any problems with your medications? Patient denies any issues with medications.   What concerns would like to discuss with the pharmacist? Patient does not have anything to discuss at this time.   Patient reminded to have all medications, supplements and any blood sugar and blood pressure readings available for review with Debbora Dus, Pharm. D, at their telephone visit on Jun 17, 2020.      Follow-Up:  Pharmacist Review and Scheduled Follow-Up With Clinical Pharmacist   Debbora Dus, CPP notified  Margaretmary Dys,  [facility] Pharmacy Assistant [phone]

## 2020-05-07 ENCOUNTER — Ambulatory Visit
Admission: RE | Admit: 2020-05-07 | Discharge: 2020-05-07 | Disposition: A | Discharge status: Home / Self Care | Payer: Medicare Other | Source: Ambulatory Visit | Attending: Radiation Oncology | Admitting: Radiation Oncology

## 2020-05-07 ENCOUNTER — Ambulatory Visit: Payer: Medicare Other | Admitting: Internal Medicine

## 2020-05-07 ENCOUNTER — Other Ambulatory Visit: Payer: Self-pay

## 2020-05-07 DIAGNOSIS — C7931 Secondary malignant neoplasm of brain: Secondary | ICD-10-CM | POA: Insufficient documentation

## 2020-05-07 DIAGNOSIS — C3431 Malignant neoplasm of lower lobe, right bronchus or lung: Secondary | ICD-10-CM | POA: Diagnosis not present

## 2020-05-07 DIAGNOSIS — Z298 Encounter for other specified prophylactic measures: Secondary | ICD-10-CM | POA: Diagnosis not present

## 2020-05-08 NOTE — Progress Notes (Signed)
VASHON, RIORDAN ([id number]) Visit Report for 05/02/2020 Arrival Information Details Patient Name: [name], [name] Date of Service: 05/02/2020 12:45 PM Medical Record Number: [id number] Patient Account Number: [id number] Date of Birth/Sex: 02/09/1946 (75 y.o. M) Treating RN: Carlene Coria Primary Care Lauriel Helin: [name] Other Clinician: Referring Adin Laker: [name] Treating Jakarri Lesko/Extender: Skipper Cliche in Treatment: 21 Visit Information History Since Last Visit All ordered tests and consults were completed: No Patient Arrived: Wheel Chair Added or deleted any medications: No Arrival Time: 13:11 Any new allergies or adverse reactions: No Accompanied By: self Had a fall or experienced change in No Transfer Assistance: None activities of daily living that may affect Patient Identification Verified: Yes risk of falls: Secondary Verification Process Completed: Yes Signs or symptoms of abuse/neglect since last visito No Patient Requires Transmission-Based Precautions: No Hospitalized since last visit: No Patient Has Alerts: No Implantable device outside of the clinic excluding No cellular tissue based products placed in the center since last visit: Has Dressing in Place as Prescribed: Yes Pain Present Now: No Electronic Signature(s) Signed: 05/08/2020 11:33:31 AM By: Carlene Coria RN Entered By: Carlene Coria on 05/02/2020 13:11:58 [name] ([id number]) -------------------------------------------------------------------------------- Clinic Level of Care Assessment Details Patient Name: [name] Date of Service: 05/02/2020 12:45 PM Medical Record Number: [id number] Patient Account Number: [id number] Date of Birth/Sex: 09-25-1945 (75 y.o. M) Treating RN: Carlene Coria Primary Care Nichalos Brenton: [name] Other Clinician: Referring Davey Limas: [name] Treating Aviannah Castoro/Extender: Skipper Cliche in Treatment: 21 Clinic Level of Care  Assessment Items TOOL 4 Quantity Score X - Use when only an EandM is performed on FOLLOW-UP visit 1 0 ASSESSMENTS - Nursing Assessment / Reassessment X - Reassessment of Co-morbidities (includes updates in patient status) 1 10 X- 1 5 Reassessment of Adherence to Treatment Plan ASSESSMENTS - Wound and Skin Assessment / Reassessment X - Simple Wound Assessment / Reassessment - one wound 1 5 []  - 0 Complex Wound Assessment / Reassessment - multiple wounds []  - 0 Dermatologic / Skin Assessment (not related to wound area) ASSESSMENTS - Focused Assessment []  - Circumferential Edema Measurements - multi extremities 0 []  - 0 Nutritional Assessment / Counseling / Intervention []  - 0 Lower Extremity Assessment (monofilament, tuning fork, pulses) []  - 0 Peripheral Arterial Disease Assessment (using hand held doppler) ASSESSMENTS - Ostomy and/or Continence Assessment and Care []  - Incontinence Assessment and Management 0 []  - 0 Ostomy Care Assessment and Management (repouching, etc.) PROCESS - Coordination of Care X - Simple Patient / Family Education for ongoing care 1 15 []  - 0 Complex (extensive) Patient / Family Education for ongoing care X- 1 10 Staff obtains Programmer, systems, Records, Test Results / Process Orders []  - 0 Staff telephones HHA, Nursing Homes / Clarify orders / etc []  - 0 Routine Transfer to another Facility (non-emergent condition) []  - 0 Routine Hospital Admission (non-emergent condition) []  - 0 New Admissions / Biomedical engineer / Ordering NPWT, Apligraf, etc. []  - 0 Emergency Hospital Admission (emergent condition) X- 1 10 Simple Discharge Coordination []  - 0 Complex (extensive) Discharge Coordination PROCESS - Special Needs []  - Pediatric / Minor Patient Management 0 []  - 0 Isolation Patient Management []  - 0 Hearing / Language / Visual special needs []  - 0 Assessment of Community assistance (transportation, D/C planning, etc.) []  - 0 Additional  assistance / Altered mentation []  - 0 Support Surface(s) Assessment (bed, cushion, seat, etc.) INTERVENTIONS - Wound Cleansing / Measurement [place], Ezechiel E. ([id number]) X- 1 5 Simple Wound Cleansing -  one wound []  - 0 Complex Wound Cleansing - multiple wounds X- 1 5 Wound Imaging (photographs - any number of wounds) []  - 0 Wound Tracing (instead of photographs) X- 1 5 Simple Wound Measurement - one wound []  - 0 Complex Wound Measurement - multiple wounds INTERVENTIONS - Wound Dressings X - Small Wound Dressing one or multiple wounds 1 10 []  - 0 Medium Wound Dressing one or multiple wounds []  - 0 Large Wound Dressing one or multiple wounds []  - 0 Application of Medications - topical []  - 0 Application of Medications - injection INTERVENTIONS - Miscellaneous []  - External ear exam 0 []  - 0 Specimen Collection (cultures, biopsies, blood, body fluids, etc.) []  - 0 Specimen(s) / Culture(s) sent or taken to Lab for analysis []  - 0 Patient Transfer (multiple staff / Civil Service fast streamer / Similar devices) []  - 0 Simple Staple / Suture removal (25 or less) []  - 0 Complex Staple / Suture removal (26 or more) []  - 0 Hypo / Hyperglycemic Management (close monitor of Blood Glucose) []  - 0 Ankle / Brachial Index (ABI) - do not check if billed separately X- 1 5 Vital Signs Has the patient been seen at the hospital within the last three years: Yes Total Score: 85 Level Of Care: New/Established - Level 3 Electronic Signature(s) Signed: 05/08/2020 11:33:31 AM By: Carlene Coria RN Entered By: Carlene Coria on 05/02/2020 13:39:19 [name] ([id number]) -------------------------------------------------------------------------------- Encounter Discharge Information Details Patient Name: [name] Date of Service: 05/02/2020 12:45 PM Medical Record Number: [id number] Patient Account Number: [id number] Date of Birth/Sex: Nov 26, 1945 (75 y.o. M) Treating RN: Carlene Coria Primary  Care Joaquim Tolen: [name] Other Clinician: Referring Hasani Diemer: [name] Treating Ryana Montecalvo/Extender: Skipper Cliche in Treatment: 21 Encounter Discharge Information Items Discharge Condition: Stable Ambulatory Status: Wheelchair Discharge Destination: Home Transportation: Private Auto Accompanied By: self Schedule Follow-up Appointment: Yes Clinical Summary of Care: Patient Declined Electronic Signature(s) Signed: 05/08/2020 11:33:31 AM By: Carlene Coria RN Entered By: Carlene Coria on 05/02/2020 13:40:13 [name] ([id number]) -------------------------------------------------------------------------------- Lower Extremity Assessment Details Patient Name: [name] Date of Service: 05/02/2020 12:45 PM Medical Record Number: [id number] Patient Account Number: [id number] Date of Birth/Sex: April 19, 1945 (74 y.o. M) Treating RN: Carlene Coria Primary Care Lakesia Dahle: [name] Other Clinician: Referring Emile Ringgenberg: [name] Treating Lavon Horn/Extender: Skipper Cliche in Treatment: 21 Edema Assessment Assessed: [Left: No] [Right: No] [Left: Edema] [Right: :] Calf Left: Right: Point of Measurement: From Medial Instep 31 cm 30 cm Ankle Left: Right: Point of Measurement: From Medial Instep 21 cm 20 cm Vascular Assessment Pulses: Dorsalis Pedis Palpable: [Left:Yes] [Right:Yes] Electronic Signature(s) Signed: 05/08/2020 11:33:31 AM By: Carlene Coria RN Entered By: Carlene Coria on 05/02/2020 13:19:19 [name] ([id number]) -------------------------------------------------------------------------------- Multi Wound Chart Details Patient Name: [name] Date of Service: 05/02/2020 12:45 PM Medical Record Number: [id number] Patient Account Number: [id number] Date of Birth/Sex: November 17, 1945 (74 y.o. M) Treating RN: Carlene Coria Primary Care Makailey Hodgkin: [name] Other Clinician: Referring Jeryn Bertoni: [name] Treating  Zimal Weisensel/Extender: Skipper Cliche in Treatment: 21 Vital Signs Height(in): 71 Pulse(bpm): 72 Weight(lbs): 190 Blood Pressure(mmHg): 100/59 Body Mass Index(BMI): 26 Temperature(F): 98.1 Respiratory Rate(breaths/min): 18 Photos: [7:No Photos] [N/A:N/A] Wound Location: Left, Lateral, Plantar Foot Right, Dorsal Foot N/A Wounding Event: Surgical Injury Pressure Injury N/A Primary Etiology: Diabetic Wound/Ulcer of the Lower Diabetic Wound/Ulcer of the Lower N/A Extremity Extremity Secondary Etiology: N/A Pressure Ulcer N/A Comorbid History: Cataracts, Middle ear problems, Cataracts, Middle ear problems, N/A Sleep Apnea, Hypertension,  Type II Sleep Apnea, Hypertension, Type II Diabetes, Gout, Osteoarthritis, Diabetes, Gout, Osteoarthritis, Neuropathy, Received Neuropathy, Received Chemotherapy Chemotherapy Date Acquired: 11/23/2019 01/23/2020 N/A Weeks of Treatment: 21 12 N/A Wound Status: Open Open N/A Measurements L x W x D (cm) 0x0x0 0.5x0.3x0.2 N/A Area (cm) : 0 0.118 N/A Volume (cm) : 0 0.024 N/A % Reduction in Area: 100.00% 62.40% N/A % Reduction in Volume: 100.00% 22.60% N/A Classification: Grade 3 Grade 2 N/A Exudate Amount: None Present Medium N/A Exudate Type: N/A Serosanguineous N/A Exudate Color: N/A red, brown N/A Wound Margin: Thickened Flat and Intact N/A Granulation Amount: None Present (0%) Large (67-100%) N/A Necrotic Amount: None Present (0%) None Present (0%) N/A Exposed Structures: Fascia: No Fat Layer (Subcutaneous Tissue): N/A Fat Layer (Subcutaneous Tissue): Yes No Fascia: No Tendon: No Tendon: No Muscle: No Muscle: No Joint: No Joint: No Bone: No Bone: No Epithelialization: Large (67-100%) Medium (34-66%) N/A Treatment Notes Electronic Signature(s) Signed: 05/08/2020 11:33:31 AM By: Carlene Coria RN Entered By: Carlene Coria on 05/02/2020 13:35:33 [name] ([name]  ([id number]) -------------------------------------------------------------------------------- Multi-Disciplinary Care Plan Details Patient Name: [name] Date of Service: 05/02/2020 12:45 PM Medical Record Number: [id number] Patient Account Number: [id number] Date of Birth/Sex: 1945-06-14 (74 y.o. M) Treating RN: Carlene Coria Primary Care Letisha Yera: [name] Other Clinician: Referring Marji Kuehnel: [name] Treating Thorn Demas/Extender: Skipper Cliche in Treatment: 21 Active Inactive Electronic Signature(s) Signed: 05/08/2020 11:33:31 AM By: Carlene Coria RN Entered By: Carlene Coria on 05/02/2020 13:35:16 [name] ([id number]) -------------------------------------------------------------------------------- Pain Assessment Details Patient Name: [name] Date of Service: 05/02/2020 12:45 PM Medical Record Number: [id number] Patient Account Number: [id number] Date of Birth/Sex: [birth year] (74 y.o. M) Treating RN: Carlene Coria Primary Care Kunal Levario: [name] Other Clinician: Referring Andriana Casa: [name] Treating Zahira Brummond/Extender: Skipper Cliche in Treatment: 21 Active Problems Location of Pain Severity and Description of Pain Patient Has Paino No Site Locations Pain Management and Medication Current Pain Management: Electronic Signature(s) Signed: 05/08/2020 11:33:31 AM By: Carlene Coria RN Entered By: Carlene Coria on 05/02/2020 13:12:31 [name] ([id number]) -------------------------------------------------------------------------------- Patient/Caregiver Education Details Patient Name: [name] Date of Service: 05/02/2020 12:45 PM Medical Record Number: [id number] Patient Account Number: [id number] Date of Birth/Gender: 1945/07/13 (74 y.o. M) Treating RN: Carlene Coria Primary Care Physician: [name] Other Clinician: Referring Physician: [name] Treating Physician/Extender: Skipper Cliche in  Treatment: 21 Education Assessment Education Provided To: Patient Education Topics Provided Wound/Skin Impairment: Methods: Explain/Verbal Responses: State content correctly Electronic Signature(s) Signed: 05/08/2020 11:33:31 AM By: Carlene Coria RN Entered By: Carlene Coria on 05/02/2020 13:39:36 [name] ([id number]) -------------------------------------------------------------------------------- Wound Assessment Details Patient Name: [name] Date of Service: 05/02/2020 12:45 PM Medical Record Number: [id number] Patient Account Number: [id number] Date of Birth/Sex: [birth year] (74 y.o. M) Treating RN: Carlene Coria Primary Care Christiana Gurevich: [name] Other Clinician: Referring Genita Nilsson: [name] Treating Lew Prout/Extender: Skipper Cliche in Treatment: 21 Wound Status Wound Number: 7 Primary Diabetic Wound/Ulcer of the Lower Extremity Etiology: Wound Location: Left, Lateral, Plantar Foot Wound Open Wounding Event: Surgical Injury Status: Date Acquired: 11/23/2019 Comorbid Cataracts, Middle ear problems, Sleep Apnea, Weeks Of Treatment: 21 History: Hypertension, Type II Diabetes, Gout, Osteoarthritis, Clustered Wound: No Neuropathy, Received Chemotherapy Wound Measurements Length: (cm) 0 Width: (cm) 0 Depth: (cm) 0 Area: (cm) 0 Volume: (cm) 0 % Reduction in Area: 100% % Reduction in Volume: 100% Epithelialization: Large (67-100%) Tunneling: No Undermining: No Wound Description Classification: Grade 3 Wound Margin: Thickened Exudate  Amount: None Present Foul Odor After Cleansing: No Slough/Fibrino No Wound Bed Granulation Amount: None Present (0%) Exposed Structure Necrotic Amount: None Present (0%) Fascia Exposed: No Fat Layer (Subcutaneous Tissue) Exposed: No Tendon Exposed: No Muscle Exposed: No Joint Exposed: No Bone Exposed: No Electronic Signature(s) Signed: 05/08/2020 11:33:31 AM By: Carlene Coria RN Entered By: Carlene Coria on 05/02/2020 13:18:29 [name] ([id number]) -------------------------------------------------------------------------------- Wound Assessment Details Patient Name: [name] Date of Service: 05/02/2020 12:45 PM Medical Record Number: [id number] Patient Account Number: [id number] Date of Birth/Sex: [birth year] (74 y.o. M) Treating RN: Carlene Coria Primary Care Tonie Vizcarrondo: [name] Other Clinician: Referring Lashe Oliveira: [name] Treating Madeliene Tejera/Extender: Skipper Cliche in Treatment: 21 Wound Status Wound Number: 9 Primary Diabetic Wound/Ulcer of the Lower Extremity Etiology: Wound Location: Right, Dorsal Foot Secondary Pressure Ulcer Wounding Event: Pressure Injury Etiology: Date Acquired: 01/23/2020 Wound Open Weeks Of Treatment: 12 Status: Clustered Wound: No Comorbid Cataracts, Middle ear problems, Sleep Apnea, History: Hypertension, Type II Diabetes, Gout, Osteoarthritis, Neuropathy, Received Chemotherapy Photos Wound Measurements Length: (cm) 0.5 % Reduct Width: (cm) 0.3 % Reduct Depth: (cm) 0.2 Epitheli Area: (cm) 0.118 Tunneli Volume: (cm) 0.024 Undermi ion in Area: 62.4% ion in Volume: 22.6% alization: Medium (34-66%) ng: No ning: No Wound Description Classification: Grade 2 Foul Od Wound Margin: Flat and Intact Slough/ Exudate Amount: Medium Exudate Type: Serosanguineous Exudate Color: red, brown or After Cleansing: No Fibrino No Wound Bed Granulation Amount: Large (67-100%) Exposed Structure Necrotic Amount: None Present (0%) Fascia Exposed: No Fat Layer (Subcutaneous Tissue) Exposed: Yes Tendon Exposed: No Muscle Exposed: No Joint Exposed: No Bone Exposed: No Treatment Notes Wound #9 (Foot) Wound Laterality: Dorsal, Right Cleanser Normal Saline JESAIAH, FABIANO ([id number]) Discharge Instruction: Wash your hands with soap and water. Remove old dressing, discard into plastic bag and place into trash. Cleanse  the wound with Normal Saline prior to applying a clean dressing using gauze sponges, not tissues or cotton balls. Do not scrub or use excessive force. Pat dry using gauze sponges, not tissue or cotton balls. Peri-Wound Care Topical Primary Dressing Prisma 4.34 (in) Discharge Instruction: Moisten w/normal saline or sterile water; Cover wound as directed. Do not remove from wound bed. Secondary Dressing Bordered Gauze Sterile-HBD 4x4 (in/in) Discharge Instruction: Cover wound with Bordered Guaze Sterile as directed Secured With Compression Wrap Compression Stockings Add-Ons Electronic Signature(s) Signed: 05/08/2020 11:33:31 AM By: Carlene Coria RN Entered By: Carlene Coria on 05/02/2020 13:19:00 [name] ([id number]) -------------------------------------------------------------------------------- [place] Details Patient Name: [name] Date of Service: 05/02/2020 12:45 PM Medical Record Number: [id number] Patient Account Number: [id number] Date of Birth/Sex: [birth year] (74 y.o. M) Treating RN: Carlene Coria Primary Care Shironda Kain: [name] Other Clinician: Referring Raley Novicki: [name] Treating Daimen Shovlin/Extender: Skipper Cliche in Treatment: 21 Vital Signs Time Taken: 13:12 Temperature (F): 98.1 Height (in): 71 Pulse (bpm): 52 Weight (lbs): 190 Respiratory Rate (breaths/min): 18 Body Mass Index (BMI): 26.5 Blood Pressure (mmHg): 100/59 Reference Range: 80 - 120 mg / dl Electronic Signature(s) Signed: 05/08/2020 11:33:31 AM By: Carlene Coria RN Entered By: Carlene Coria on 05/02/2020 13:12:23

## 2020-05-09 ENCOUNTER — Other Ambulatory Visit: Payer: Self-pay

## 2020-05-09 ENCOUNTER — Encounter: Payer: Medicare Other | Admitting: Physician Assistant

## 2020-05-09 DIAGNOSIS — M21372 Foot drop, left foot: Secondary | ICD-10-CM | POA: Diagnosis not present

## 2020-05-09 DIAGNOSIS — C3490 Malignant neoplasm of unspecified part of unspecified bronchus or lung: Secondary | ICD-10-CM | POA: Diagnosis not present

## 2020-05-09 DIAGNOSIS — E11621 Type 2 diabetes mellitus with foot ulcer: Secondary | ICD-10-CM | POA: Diagnosis not present

## 2020-05-09 DIAGNOSIS — E1142 Type 2 diabetes mellitus with diabetic polyneuropathy: Secondary | ICD-10-CM | POA: Diagnosis not present

## 2020-05-09 DIAGNOSIS — Z794 Long term (current) use of insulin: Secondary | ICD-10-CM | POA: Diagnosis not present

## 2020-05-09 DIAGNOSIS — L97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scle: Secondary | ICD-10-CM | POA: Diagnosis not present

## 2020-05-09 DIAGNOSIS — L89899 Pressure ulcer of other site, unspecified stage: Secondary | ICD-10-CM | POA: Diagnosis not present

## 2020-05-09 DIAGNOSIS — L97512 Non-pressure chronic ulcer of other part of right foot with fat layer exposed: Secondary | ICD-10-CM | POA: Diagnosis not present

## 2020-05-09 NOTE — Progress Notes (Addendum)
STANFORD, STRAUCH ([id number]) Visit Report for 05/09/2020 Chief Complaint Document Details Patient Name: [name], [name] Date of Service: 05/09/2020 11:00 AM Medical Record Number: [id number] Patient Account Number: [id number] Date of Birth/Sex: 1945/06/16 (75 y.o. M) Treating RN: Cornell Barman Primary Care Provider: [name] Other Clinician: Referring Provider: [name] Treating Provider/Extender: Skipper Cliche in Treatment: 22 Information Obtained from: Patient Chief Complaint Left plantar foot ulcer s/p surgical debridement and wound VAC placement. Left great toe ulcer. Electronic Signature(s) Signed: 05/09/2020 11:17:34 AM By: Worthy Keeler [name] Entered By: Worthy Keeler on 05/09/2020 11:17:33 [name] ([id number]) -------------------------------------------------------------------------------- HPI Details Patient Name: [name] Date of Service: 05/09/2020 11:00 AM Medical Record Number: [id number] Patient Account Number: [id number] Date of Birth/Sex: 18-Sep-1945 (75 y.o. M) Treating RN: Cornell Barman Primary Care Provider: [name] Other Clinician: Referring Provider: [name] Treating Provider/Extender: Skipper Cliche in Treatment: 22 History of Present Illness HPI Description: 10/04/16 on evaluation today patient presents for initial evaluation concerning a laceration of the right wrist and hand on the bowler aspect. He tells me that he fell into glass following a medication change in regard to his diabetes. He initially went to [facility] where she was sutured and subsequently his daughter who is a Marine scientist removed the teachers at the appropriate time. Unfortunately the wound has done poorly and dehissed which has caused him some trouble including infection. He has been on antibiotics both topically and orally though the wound continues to show signs of necrotic tissue according to notes which is what he was sent to [place] for  evaluation concerning. He does have diabetes and unfortunately this is severely uncontrolled. He does see an endocrinologist and is working on this. 10/11/2016 -- the patient's notes from the ER were reviewed and he had his suturing done on 09/17/2016 and had necrotic skin flaps which had to be trimmed the last visit he was here. He has managed to get his Santyl ointment and has been using this appropriately. 10/21/16 patient's wound appears to be doing very well on evaluation today. He still has some Slough covering the wound bed but this is nowhere near as severe as when i saw this initially two weeks ago nor even my review of his pictures last week. I'm pleased with how this is progressing. Patient's wound over the right form appears to be doing fairly well on evaluation today. There is no evidence of infection and he has a small area at the crease of where she is wrist extends and flexes that is still open and appears to be having difficulty due to the fact that it is cracking open. I think this is something that may be controlled with moisture control that is keeping the wound a little bit more moist. Nonetheless this is a tiny region compared to the initial wound and overall appears to be doing very well. No fevers, chills, nausea, or vomiting noted at this time. 11/25/16 on evaluation today patient appears to be doing well in regard to his right wrist ulcer. In fact this appears to be completely healed he is having no discomfort. He is pleased with how this is progressed. Readmission: 06/24/17 on evaluation today patient appears back in our clinic regarding issues that he has been having with his right ankle on the lateral malleolus as well is in the dorsal surface of his left foot. Both have been going on for several weeks unfortunately. He tells me that initially this was being managed  by Dr. Edilia Bo and I did review the notes from Dr. Edilia Bo in epic. Patient was placed on amoxicillin  initially and then subsequently Keflex following. He has been using triple antibiotic ointment over-the-counter along with a Band-Aid for the wound currently. He did not have any Santyl remaining. His most recent hemoglobin A1c which was just two days ago was 10.3. Fortunately he seems to have excellent blood flow in the bilateral lower extremities with his left ABI being 0.95 in the right ABI 1.01. Patient does have some pain in regard to each of these ulcerations but he tells me it's not nearly as severe as the hand wound that I help treat earlier over the summer. This was in 2018. 07/15/17 on evaluation today patient's lower surety ulcer is actually both appear to be doing fairly well at this point. He does have a little bit of skin overlapping the ulcer on the right lateral ankle this was debrided away today just with saline and gauze without complication patient had no discomfort or pain. Otherwise patient's left foot ulcer does appear to be showing signs of improvement he does seem to have a right great toe. Nikki and noted at this point in time. I think that this does need to be managed at this point. No fevers, chills, nausea, or vomiting noted at this time. 07/22/17 on evaluation today patient's wounds in regard to his lower extremities appear to be doing somewhat better. His right ankle wound in fact appears to likely be completely closed. The left dorsal foot ulcer though still open does appear to be a little bit smaller and does also seem to be making good progress. He has not been having problems with the treatment at this point in the general he does not seem to show as much erythema surrounding the wound bed which is good news. He is not having significant discomfort. 08/12/17 on evaluation today patients wounds of both appeared to be doing better in fact they both appear to be completely healed. Overall I'm very pleased with how things have progressed over the past week even more than what  I expected. Obviously this is good news and he is happy about this. He did have a fall when going to his orthopedic doctor recently fortunately he does not appear to have injured anything severely but nonetheless does have a couple of bumps and bruises nothing significant at this time but he needs to be seen and wound care for. He may have broken his left wrist he is following up with orthopedics in that regard. Readmission: 10/24/17 on evaluation today patient presents for reevaluation after having been discharged Aug 12, 2017. His wound was healed at that point although he states that it has been somewhat discolored since that time. He has not experienced the drainage as far as the wound is concerned but the fact that it was still discolored been greater than two months after the fact made him want to come in for reevaluation. Upon inspection today the patient has no pain although he does have neuropathy. He does have a discolored region of the dorsal surface of the left foot which was the site where the ulcer was that we previously treated. The good news is he does not again have any discomfort although obviously I do believe this may be some scar tissue and just discoloration in that regard. Again he hasn't had any drainage or discharge since I last saw him and at this point I see no evidence of that  either this area shows complete epithelialization. In general I do not feel like there's any significant issue at this point in that regard. READMISSION 02/09/2019 This is a 75 year old man with type 2 diabetes poorly controlled with significant peripheral neuropathy. He has been in this clinic before in 2018 with a laceration on his right hand and then again in 2019 with an area over the right lateral malleolus. 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 He states that the wound is been there for the last week  or LEIAM, HOPWOOD. ([id number]) 2. It is been uncomfortable to walk on. The man is an active man having to look after an elderly mother. Past medical history, type 2 diabetes with polyneuropathy with a recent hemoglobin A1c in September 2011 0.3, lumbar disc disease, hypergonadism., Left foot drop and gout. We did not repeat his ABIs in the clinic quoting values from July 2019 of 0.95 and 1.01. He is not felt to have significant PAD 02/19/2019 on evaluation today patient actually appears to be doing better with regard to his foot wound based on what I am seeing currently. He was seen by Dr. Dellia Nims on readmission back into the clinic last week when I was off. The good news is the patient has been tolerating the dressing changes without any complication in fact has not really had much in the way of drainage. He does have an AFO brace which she believes is actually pushing on this area as well and has contributed to the issue. Fortunately there is no signs of active infection at this point. His ABIs appear to be well. 03/05/2019 on evaluation today patient appears to be doing well in regard to his foot ulcer. He has been tolerating the dressing changes without complication. Fortunately there is no signs of active infection. The wound is measuring somewhat smaller and overall I feel like he is making good progress at this point. 03/12/2019 on evaluation today patient actually appears to be doing quite well with regard to his foot ulcer. The wound is looking better the measurement is not significantly better at this point unfortunately but nonetheless again the overall appearance is improving I think he is making some progress. I still think the big issue here is foot drop and again the patient wants to see if potentially he could get a brace to help with this. Subsequently I think that we can definitely make a referral to [facility] to see if they can help [place] in this regard. He is in agreement with that  plan. 03/26/2019 on evaluation today patient appears to be doing more poorly in regard to his foot ulcer at this time. He did see podiatry in order to see about getting a brace but unfortunately he tells me that Medicare is not likely to pay for one for him as he had 1 I guess 8 months ago. However this one that apparently goes in his shoes that does not seem to be working out well for him. Unfortunately there are signs of active infection at this time. No fevers, chills, nausea, vomiting, or diarrhea. Systemically. The patient has erythema on the dorsal surface of his foot that tracks from the lateral portion of his foot and again he does have a blister around the wound as well which is worse compared to previous. Overall I am concerned. 04/02/2019 on evaluation today patient appears to be doing about  the same with regard to his wound. Unfortunately there is no signs of significant improvement although I do think there is no signs of infection which is good news. His drainage is also slowed down tremendously. I think at this point he would benefit from the initiation of a total contact cast. The only concern I have is that over his balance. I explained to the patient that if we were to initiate the cast I think it would be greatly beneficial for him but he would need to ensure not to walk at all unless he uses a walker he actually has 2 walkers one for inside his home and one that he keeps out in the car. For that reason he would be covered in that regard. I just do not want him to have any accidents and fall with the cast. 12/28-Patient comes in 1 week out a day early for his appointment he was in the hospital for 2 days for uncontrolled hyperglycemia and required management of high blood sugars, with some adjustments in his home regimen of insulin, patient stubbed his right great toe today and put a Band-Aid on it, he is here for his left lateral foot ulcer and we are using silver alginate  dressing, he was intact total contact cast with difficulty with balance to the extent that that had to be discontinued 04/16/2019 on evaluation today patient appears to be doing better with regard to the overall appearance of his wound size. This is good news. We had attempted a total contact cast but unfortunately he had some falling issues that the family contacted [place] about we took the cast off we did not put it back on. Nonetheless it ended up that just a couple days later he was in the hospital due to elevations in his blood sugar which went to around 500 and even a little higher. Subsequently he was admitted to the hospital and was in the hospital for a total of 3 days and that included Christmas day. Fortunately there got his blood sugar under control he tells me that this is running about 200 now which is much better news. There does not appear to be any signs of active infection at this time. No fevers, chills, nausea, vomiting, or diarrhea. 04/30/2019 on evaluation today patient actually appears to be doing slightly worse compared to previous as far as the overall size of his wound is concerned. There also is some need for sharp debridement today as well. Fortunately there is no evidence of active infection which is good news. No fevers, chills, nausea, vomiting, or diarrhea. 05/07/2019 upon evaluation today patient appears to be doing excellent in regard to his plantar foot ulcer. He has been tolerating the dressing changes without complication. Fortunately there is no signs of active infection at this time. No fevers, chills, nausea, vomiting, or diarrhea. He does tell me that he actually got in touch with [employer] and they feel like they can add diagnosis codes to his orders in order to get an external brace for him as opposed to the one that goes internal to issue. That would be excellent for his foot drop and I did tell him that if there is anything I need to fill out in that regard I will be  more than happy to oblige and fill out what is needed for [employer]. 05/14/2019 upon evaluation today patient's original wound site actually appears to be doing much better which is good news. Fortunately there is no signs of active infection  at this time. No fevers, chills, nausea, vomiting, or diarrhea. With that being said the patient unfortunately is continuing to have issues with rubbing he tells me that the felt slid on his foot although he did not realize it until today. Nonetheless I think this did cause a blister around the edge of the wound and he does an open wound that is slightly larger overall in measurement compared to last time mainly due to this blister. Other than that I feel like he is doing quite well. 05/21/2019 upon evaluation today patient appears to be doing excellent in regard to his wound at this time on the plantar foot. He is showing signs of improvement he does have some slough and biofilm noted on the surface of the wound this is going require some sharp debridement today as well. Fortunately there is no evidence of active infection. No fevers, chills, nausea, vomiting, or diarrhea. 05/28/2019 on evaluation today patient's wound actually appears to be measuring smaller upon initial inspection and I do believe that it is doing well. With that being said he continues to have a blister on the outside/lateral edge of the wound that occurs as a result of rubbing due to his dropfoot currently. Subsequently we have filled out the paperwork for his new external brace but again that does take several weeks to get that completed once everything is returned to back we did send this back last week as far as the paperwork was concerned. With that being said there does not appear to be any signs of active infection at this time. 06/04/2019 on evaluation today patient's wound actually appears to be doing slightly better compared to last week which is good news. He still states that his  insurance unfortunately does not seem to want to cover any of his new brace. He is going to just pay for this himself. Nonetheless he is going by there today when he leaves here that is [employer] in [place] that is going to be making this brace for him. 06/18/2019 upon evaluation today patient fortunately does have his new brace which is the external AFO brace with bilateral uprights. Subsequently he seems to be doing much better in fact the wound already appears better. He has not even been using the felt he is just using a dressing over [place] ([id number]) top of the wound and overall I am very pleased with how things seem to be progressing. There is no signs of active infection at this time which is great news and I think that the AFO is good to help him tremendously as far as trying to keep pressure off of the 06/25/2019 upon evaluation today patient seems to be making good progress in regard to his wound. He has been tolerating the dressing changes without complication. Fortunately there is no signs of active infection at this time. No fevers, chills, nausea, vomiting, or diarrhea. 07/02/2019 upon evaluation today patient unfortunately had a blistered area on the bottom of his foot tracking more towards the midline of the plantar aspect. He does not seem to have issues with rubbing more laterally that he used to have but nonetheless this is still giving him some trouble and causing problems here. Fortunately there is no signs of active infection at this time. No fevers, chills, nausea, vomiting, or diarrhea. 07/09/2019 upon evaluation today patient appears to be doing well with regard to his plantar foot ulcer. Definitely this is much improved compared to last week's visit which again last week the  issue there was that he had an area of callus buildup on the plantar aspect of his foot I was able to clean this away and after effectively doing so the wound seems to be doing much better which  is great news. There is no signs of active infection at this time. 07/16/2019 upon evaluation today patient appears to be making some progress in my opinion with regard to his plantar foot ulcer. He has been tolerating the dressing changes without complication. Fortunately there is no evidence of active infection at this time which is great news. 07/23/2019 upon evaluation today patient appears to be doing well with regard to his wounds on the plantar foot. He has been tolerating the dressing changes without complication. These do seem to be healing. 08/06/2019 upon evaluation today patient's wound actually appears to be showing some signs of improvement though he still has a significant amount of callus buildup over this location. That is can require some debridement today. With that being said the actual wound openings seem to be dramatically improved which is great news. There is no signs of active infection at this time. No fevers, chills, nausea, vomiting, or diarrhea. 08/13/2019 upon evaluation today patient appears to be doing well with regard to his foot ulcer. He has been tolerating the dressing changes without complication. Fortunately there is no signs of active infection at this time. No fevers, chills, nausea, vomiting, or diarrhea. 08/20/2019 upon evaluation today patient appears to be doing excellent in regard to his foot ulcer. I do believe he still showing signs of improvement this is very slow but again I think still pressure getting to the area even with his brace and custom shoe is still part of the main issue with the delay here. Nonetheless I do not see any signs of worsening or infection all of which is good news. 5/20; left lateral foot wound. He wears an AFO brace for foot drop related to his diabetes. The wound is small and clean looking he is using silver collagen. We have apparently tried him in offloading shoe he could not tolerate this. He does not have good balance. 09/11/2019  upon evaluation today patient appears to be doing more poorly in regard to his foot ulcer as compared to last time I saw him. Unfortunately he does tell me as we discussed on Friday that he had pus and blood coming from the area underneath the callus. Unfortunately I think that this is a combination of the callus not being trimmed away enough and potentially the felt pad causing an abnormal rubbing which has happened before which led to this becoming more irritated and likely infected based on what I am seeing today. The patient takes ampicillin all the time. Subsequently he also states that he has been trying for a couple of days to take the clindamycin of which she had some leftover from previous. With that being said it upset his stomach. The antibiotic that I sent in for him on Friday unfortunately I wrote the wrong number on as far as the number of pills that he needed. With that being said I corrected that this morning but he tells me is not can pick it up as that seems to be making him have trouble with his stomach being upset at this point. Fortunately there is no signs of active infection at this time systemically. 09/17/2019 upon evaluation today patient appears to be doing better in regard to his foot ulcer. Has been taking ampicillin that he had  leftover from a previous prescription. With that being said he did go see his doctor today concerning his diabetes and notes that his hemoglobin A1c was 13 obviously this is elevated and there can be working on that. Also did review the culture result that we finally got back for final well that showed rare Pseudomonas and a few group B strep noted. The Pseudomonas was sensitive to Cipro but again the wound is doing so much better with the ampicillin I think this is probably not a causative organism I do not see anything that really has me concerned as far as infection is concerned today. Therefore I would hold off on prescribing Cipro for the patient  and have him discontinue the ampicillin until completion. 09/24/2019 upon evaluation today patient appears to be doing 09/24/2019 upon evaluation today patient appears to be doing well with regard to his foot ulcer. He actually has not enlarged any and seems to be measuring smaller quite significantly at this point. His toe ulcer is also doing somewhat better which is good news. Fortunately there is no evidence of active infection at this time. No fevers, chills, nausea, vomiting, or diarrhea. 10/08/2019 upon evaluation today patient appears to be doing well with regard to his foot ulcer all things considered. Fortunately there is no signs of active infection at this time. No fevers, chills, nausea, vomiting, or diarrhea. Fortunately he states that overall he has been doing much better with regard to not have any blistering on the side of his foot. Unfortunately he did have a fall last night due to the fact that he got up to walk without his brace on and did not have his walker either it was around 4 in the morning and his mother was calling out. Subsequently he states that the foot drop got him and he ended up following. Nonetheless he states he normally uses his walker I explained he needs to try to make sure not to ever go without the walker. No matter how attempting the situation may be. Readmission: 12/03/2019 upon evaluation today patient presents for follow-up after having had surgery last week on his foot for surgical debridement and then subsequent wound VAC placement. He had gone into the hospital as he was having a significant infection and not feeling well. He is unfortunately since I last saw him also been under the care of the cancer doctors and they have been taking good care of him. He does seem to be in fairly high spirits all things considered. With that being said he has lung cancer he tells me that his back from the radiation feels like it is constantly burned. 12/10/2019 on evaluation  today patient appears to be doing well with regard to his wound. I do believe the wound VAC is doing a great job at helping to manage his drainage and overall health and to granulate in the wound. He has less undermining noted today in fact almost none. Home health is coming out to change the wound VAC on a regular basis Monday, Wednesday, and Friday. 12/24/2019 on evaluation today patient appears to be doing fairly well in regard to the wound on the plantar aspect of his foot. With that being said he does have a significant feeling in of what has been going on with regard to the ulceration which does not have the depth that it did in the past. With that being said I do believe that based on what I am seeing at this point the patient likely  does need to take a break from the wound VAC and in fact it appears the wound VAC was actually placed directly on the skin as far as the bridging was concerned causing some irritation of the top of the patient's foot. This has me concerned as well that if were not careful that could worsen. I do believe a break with the wound VAC however will be fine and then we can see where we go from there. [name], [name] ([id number]) 01/07/2020 upon evaluation today patient actually appears to be doing much better in regard to his wound. He has been tolerating the dressing changes without complication. There is no signs of active infection and overall I feel like he is managing quite nicely. I do believe that we would likely discontinue the [facility] altogether today. 10/13; follow-up visit. Patient has a area on the left lateral plantar foot. He has been using Hydrofera Blue and bordered foam he has home health changing this 3 times a week. He is nonambulatory. He avoids pushing the wheelchair with that part of his foot and instead uses his heel. We have had a nice improvement today and overall surface area 10/27; 2-week follow-up. He has been using Hydrofera Blue and bordered foam  on a wound on the left lateral foot. He told me he was nonambulatory last time from foot drop although he walked in the clinic with a walker this week apparently did not have any transportation. Also he has a new wound on the right dorsal medial foot which she says was caused by foot where trauma 11/10; 2-week follow-up. We have been using Hydrofera Blue on the left lateral foot roughly at the base of the fifth metatarsal. He came in last visit with a punched out area on the right dorsal foot. He says this was foot where trauma. I reiterated that he is not wearing his previous braces and he is in. He spends most of his time in his wheelchair. He says he is careful not to be pushing himself around in the wheelchair with his feet. He is wearing regular tennis shoes. I taken a bit of time to review the history of the wound on the left foot. This actually was a chronic wound for him. Episodic infections. He had arterial studies done in August these were normal. ABI in the right of 1.05, TBI at 0.65 with triphasic waveforms on the left 1.01 with a TBI of 0.78 and again triphasic waveforms. Does not appear to be a arterial issue 11/17 we brought the patient in today to look at the area on the right dorsal foot and the left lateral plantar foot. The left was the original wound. Patient states the right dorsal foot happened from trauma from his brace. He is no longer using this. He is mostly in surgical shoes that we provide except when he goes out driving. We changed him to silver collagen last week. He does not have an arterial issue He came to the clinic today got out of his car went to the back of his car felt presyncopal and fell down. When we got out there to help him up he was alert conversational. Blood pressure little reduced at 093 systolic: We got him in the exam room. Otherwise he looks fine 03/21/2020 upon evaluation today patient appears to be doing decently well in regard to his ulcers on the  bilateral feet. Fortunately there is no signs of active infection at this time which is great news. No fever chills  noted. He has been tolerating the dressing changes without complication. Fortunately there is no sign of active infection at this time. No fevers, chills, nausea, vomiting, or diarrhea. 04/04/2020 upon evaluation today patient's wounds actually are showing signs of improvement at both locations. There is good be some sharp debridement necessary on the plantar foot but overall not much compared to where things have been in the past. Overall I feel like he is doing quite well 04/18/2020 on evaluation today patient appears to be doing well with regard to his wounds currently. Fortunately there is no signs of active infection at this time. No fevers, chills, nausea, vomiting, or diarrhea. With that being said the patient is continuing to experience some issues with open wound on the right foot on the left though this is not draining it does appear to show some signs of callus currently. 05/02/2020 upon evaluation today patient appears to be doing excellent. His left foot is healed the right foot is actually showing signs of improvement and significantly smaller it still is not completely closed however. In general I am very pleased with where things stand currently. 05/09/2020 upon evaluation today patient appears to be doing well with regard to his foot ulcer. In fact this appears to be completely healed based on what I am seeing currently. I do not see any evidence of infection and the patient is having no discomfort. No fevers, chills, nausea, vomiting, or diarrhea. Electronic Signature(s) Signed: 05/09/2020 3:33:51 PM By: Worthy Keeler [name] Entered By: Worthy Keeler on 05/09/2020 15:33:50 [name] ([id number]) -------------------------------------------------------------------------------- Physical Exam Details Patient Name: [name] Date of Service: 05/09/2020 11:00  AM Medical Record Number: [id number] Patient Account Number: [id number] Date of Birth/Sex: 1945/05/04 (75 y.o. M) Treating RN: Cornell Barman Primary Care Provider: [name] Other Clinician: Referring Provider: [name] Treating Provider/Extender: Skipper Cliche in Treatment: 78 Constitutional Well-nourished and well-hydrated in no acute distress. Respiratory normal breathing without difficulty. Psychiatric this patient is able to make decisions and demonstrates good insight into disease process. Alert and Oriented x 3. pleasant and cooperative. Notes Upon inspection patient's wound bed actually showed signs of good granulation at this time. There does not appear to be any evidence of active infection which is great news overall very pleased with where things stand. No fevers, chills, nausea, vomiting, or diarrhea. Electronic Signature(s) Signed: 05/09/2020 3:34:06 PM By: Worthy Keeler [name] Entered By: Worthy Keeler on 05/09/2020 15:34:05 [name] ([id number]) -------------------------------------------------------------------------------- Physician Orders Details Patient Name: [name] Date of Service: 05/09/2020 11:00 AM Medical Record Number: [id number] Patient Account Number: [id number] Date of Birth/Sex: March 05, 1946 (75 y.o. M) Treating RN: Dolan Amen Primary Care Provider: [name] Other Clinician: Referring Provider: [name] Treating Provider/Extender: Skipper Cliche in Treatment: 27 Verbal / Phone Orders: No Diagnosis Coding ICD-10 Coding Code Description E11.621 Type 2 diabetes mellitus with foot ulcer L97.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scle L97.512 Non-pressure chronic ulcer of other part of right foot with fat layer exposed E11.42 Type 2 diabetes mellitus with diabetic polyneuropathy M21.372 Foot drop, left foot Discharge From [facility] o Discharge from [place] area covered for protection Electronic Signature(s) Signed: 05/09/2020 4:39:20 PM By: Charlett Nose RN Signed: 05/09/2020 4:54:01 PM By: Worthy Keeler [name] Entered By: Georges Mouse, Minus Breeding on 05/09/2020 11:20:54 [name] ([id number]) -------------------------------------------------------------------------------- Problem List Details Patient Name: [name] Date  of Service: 05/09/2020 11:00 AM Medical Record Number: [id number] Patient Account Number: [id number] Date of Birth/Sex: [birth year] (75 y.o. 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 12/03/2019 No Yes muscle L97.512 Non-pressure chronic ulcer of other part of right foot with fat layer 02/06/2020 No Yes exposed E11.42 Type 2 diabetes mellitus with diabetic polyneuropathy 12/03/2019 No Yes M21.372 Foot drop, left foot 12/03/2019 No Yes Inactive Problems Resolved Problems Electronic Signature(s) Signed: 05/09/2020 11:17:21 AM By: Worthy Keeler [name] Entered By: Worthy Keeler on 05/09/2020 11:17:20 [name] ([id number]) -------------------------------------------------------------------------------- Progress Note Details Patient Name: [name] Date of Service: 05/09/2020 11:00 AM Medical Record Number: [id number] Patient Account Number: [id number] Date of Birth/Sex: 1946/02/19 (75 y.o. M) Treating RN: Cornell Barman Primary Care Provider: [name] Other Clinician: Referring Provider: [name] Treating Provider/Extender: Skipper Cliche in Treatment: 22 Subjective Chief  Complaint Information obtained from Patient Left plantar foot ulcer s/p surgical debridement and wound VAC placement. Left great toe ulcer. History of Present Illness (HPI) 10/04/16 on evaluation today patient presents for initial evaluation concerning a laceration of the right wrist and hand on the bowler aspect. He tells me that he fell into glass following a medication change in regard to his diabetes. He initially went to [facility] where she was sutured and subsequently his daughter who is a Marine scientist removed the teachers at the appropriate time. Unfortunately the wound has done poorly and dehissed which has caused him some trouble including infection. He has been on antibiotics both topically and orally though the wound continues to show signs of necrotic tissue according to notes which is what he was sent to [place] for evaluation concerning. He does have diabetes and unfortunately this is severely uncontrolled. He does see an endocrinologist and is working on this. 10/11/2016 -- the patient's notes from the ER were reviewed and he had his suturing done on 09/17/2016 and had necrotic skin flaps which had to be trimmed the last visit he was here. He has managed to get his Santyl ointment and has been using this appropriately. 10/21/16 patient's wound appears to be doing very well on evaluation today. He still has some Slough covering the wound bed but this is nowhere near as severe as when i saw this initially two weeks ago nor even my review of his pictures last week. I'm pleased with how this is progressing. Patient's wound over the right form appears to be doing fairly well on evaluation today. There is no evidence of infection and he has a small area at the crease of where she is wrist extends and flexes that is still open and appears to be having difficulty due to the fact that it is cracking open. I think this is something that may be controlled with moisture control that is keeping the wound a  little bit more moist. Nonetheless this is a tiny region compared to the initial wound and overall appears to be doing very well. No fevers, chills, nausea, or vomiting noted at this time. 11/25/16 on evaluation today patient appears to be doing well in regard to his right wrist ulcer. In fact this appears to be completely healed he is having no discomfort. He  is pleased with how this is progressed. Readmission: 06/24/17 on evaluation today patient appears back in our clinic regarding issues that he has been having with his right ankle on the lateral malleolus as well is in the dorsal surface of his left foot. Both have been going on for several weeks unfortunately. He tells me that initially this was being managed by Dr. Edilia Bo and I did review the notes from Dr. Edilia Bo in epic. Patient was placed on amoxicillin initially and then subsequently Keflex following. He has been using triple antibiotic ointment over-the-counter along with a Band-Aid for the wound currently. He did not have any Santyl remaining. His most recent hemoglobin A1c which was just two days ago was 10.3. Fortunately he seems to have excellent blood flow in the bilateral lower extremities with his left ABI being 0.95 in the right ABI 1.01. Patient does have some pain in regard to each of these ulcerations but he tells me it's not nearly as severe as the hand wound that I help treat earlier over the summer. This was in 2018. 07/15/17 on evaluation today patient's lower surety ulcer is actually both appear to be doing fairly well at this point. He does have a little bit of skin overlapping the ulcer on the right lateral ankle this was debrided away today just with saline and gauze without complication patient had no discomfort or pain. Otherwise patient's left foot ulcer does appear to be showing signs of improvement he does seem to have a right great toe. Nikki and noted at this point in time. I think that this does need to be  managed at this point. No fevers, chills, nausea, or vomiting noted at this time. 07/22/17 on evaluation today patient's wounds in regard to his lower extremities appear to be doing somewhat better. His right ankle wound in fact appears to likely be completely closed. The left dorsal foot ulcer though still open does appear to be a little bit smaller and does also seem to be making good progress. He has not been having problems with the treatment at this point in the general he does not seem to show as much erythema surrounding the wound bed which is good news. He is not having significant discomfort. 08/12/17 on evaluation today patients wounds of both appeared to be doing better in fact they both appear to be completely healed. Overall I'm very pleased with how things have progressed over the past week even more than what I expected. Obviously this is good news and he is happy about this. He did have a fall when going to his orthopedic doctor recently fortunately he does not appear to have injured anything severely but nonetheless does have a couple of bumps and bruises nothing significant at this time but he needs to be seen and wound care for. He may have broken his left wrist he is following up with orthopedics in that regard. Readmission: 10/24/17 on evaluation today patient presents for reevaluation after having been discharged Aug 12, 2017. His wound was healed at that point although he states that it has been somewhat discolored since that time. He has not experienced the drainage as far as the wound is concerned but the fact that it was still discolored been greater than two months after the fact made him want to come in for reevaluation. Upon inspection today the patient has no pain although he does have neuropathy. He does have a discolored region of the dorsal surface of the left foot which was  the site where the ulcer was that we previously treated. The good news is he does not again have  any discomfort although obviously I do believe this may be some scar tissue and just discoloration in that regard. Again he hasn't had any drainage or discharge since I last saw him and at this point I see no evidence of that either this area shows complete epithelialization. In general I do not feel like there's any significant issue at this point in that regard. [name], [name] ([id number]) 02/09/2019 This is a 75 year old man with type 2 diabetes poorly controlled with significant peripheral neuropathy. He has been in this clinic before in 2018 with a laceration on his right hand and then again in 2019 with an area over the right lateral malleolus. 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 He states that the wound is been there for the last week or 2. It is been uncomfortable to walk on. The man is an active man having to look after an elderly mother. Past medical history, type 2 diabetes with polyneuropathy with a recent hemoglobin A1c in September 2011 0.3, lumbar disc disease, hypergonadism., Left foot drop and gout. We did not repeat his ABIs in the clinic quoting values from July 2019 of 0.95 and 1.01. He is not felt to have significant PAD 02/19/2019 on evaluation today patient actually appears to be doing better with regard to his foot wound based on what I am seeing currently. He was seen by Dr. Dellia Nims on readmission back into the clinic last week when I was off. The good news is the patient has been tolerating the dressing changes without any complication in fact has not really had much in the way of drainage. He does have an AFO brace which she believes is actually pushing on this area as well and has contributed to the issue. Fortunately there is no signs of active infection at this point. His ABIs appear to be well. 03/05/2019 on evaluation today patient appears to be doing well in regard  to his foot ulcer. He has been tolerating the dressing changes without complication. Fortunately there is no signs of active infection. The wound is measuring somewhat smaller and overall I feel like he is making good progress at this point. 03/12/2019 on evaluation today patient actually appears to be doing quite well with regard to his foot ulcer. The wound is looking better the measurement is not significantly better at this point unfortunately but nonetheless again the overall appearance is improving I think he is making some progress. I still think the big issue here is foot drop and again the patient wants to see if potentially he could get a brace to help with this. Subsequently I think that we can definitely make a referral to [facility] to see if they can help [place] in this regard. He is in agreement with that plan. 03/26/2019 on evaluation today patient appears to be doing more poorly in regard to his foot ulcer at this time. He did see podiatry in order to see about getting a brace but unfortunately he tells me that Medicare is not likely to pay for one for him as he had 1 I guess 8 months ago. However this one that apparently goes in his shoes that does not seem to be working out well for him. Unfortunately there are signs of active infection at this  time. No fevers, chills, nausea, vomiting, or diarrhea. Systemically. The patient has erythema on the dorsal surface of his foot that tracks from the lateral portion of his foot and again he does have a blister around the wound as well which is worse compared to previous. Overall I am concerned. 04/02/2019 on evaluation today patient appears to be doing about the same with regard to his wound. Unfortunately there is no signs of significant improvement although I do think there is no signs of infection which is good news. His drainage is also slowed down tremendously. I think at this point he would benefit from the initiation of a total contact  cast. The only concern I have is that over his balance. I explained to the patient that if we were to initiate the cast I think it would be greatly beneficial for him but he would need to ensure not to walk at all unless he uses a walker he actually has 2 walkers one for inside his home and one that he keeps out in the car. For that reason he would be covered in that regard. I just do not want him to have any accidents and fall with the cast. 12/28-Patient comes in 1 week out a day early for his appointment he was in the hospital for 2 days for uncontrolled hyperglycemia and required management of high blood sugars, with some adjustments in his home regimen of insulin, patient stubbed his right great toe today and put a Band-Aid on it, he is here for his left lateral foot ulcer and we are using silver alginate dressing, he was intact total contact cast with difficulty with balance to the extent that that had to be discontinued 04/16/2019 on evaluation today patient appears to be doing better with regard to the overall appearance of his wound size. This is good news. We had attempted a total contact cast but unfortunately he had some falling issues that the family contacted [place] about we took the cast off we did not put it back on. Nonetheless it ended up that just a couple days later he was in the hospital due to elevations in his blood sugar which went to around 500 and even a little higher. Subsequently he was admitted to the hospital and was in the hospital for a total of 3 days and that included Christmas day. Fortunately there got his blood sugar under control he tells me that this is running about 200 now which is much better news. There does not appear to be any signs of active infection at this time. No fevers, chills, nausea, vomiting, or diarrhea. 04/30/2019 on evaluation today patient actually appears to be doing slightly worse compared to previous as far as the overall size of his wound  is concerned. There also is some need for sharp debridement today as well. Fortunately there is no evidence of active infection which is good news. No fevers, chills, nausea, vomiting, or diarrhea. 05/07/2019 upon evaluation today patient appears to be doing excellent in regard to his plantar foot ulcer. He has been tolerating the dressing changes without complication. Fortunately there is no signs of active infection at this time. No fevers, chills, nausea, vomiting, or diarrhea. He does tell me that he actually got in touch with [employer] and they feel like they can add diagnosis codes to his orders in order to get an external brace for him as opposed to the one that goes internal to issue. That would be excellent for his foot drop  and I did tell him that if there is anything I need to fill out in that regard I will be more than happy to oblige and fill out what is needed for [employer]. 05/14/2019 upon evaluation today patient's original wound site actually appears to be doing much better which is good news. Fortunately there is no signs of active infection at this time. No fevers, chills, nausea, vomiting, or diarrhea. With that being said the patient unfortunately is continuing to have issues with rubbing he tells me that the felt slid on his foot although he did not realize it until today. Nonetheless I think this did cause a blister around the edge of the wound and he does an open wound that is slightly larger overall in measurement compared to last time mainly due to this blister. Other than that I feel like he is doing quite well. 05/21/2019 upon evaluation today patient appears to be doing excellent in regard to his wound at this time on the plantar foot. He is showing signs of improvement he does have some slough and biofilm noted on the surface of the wound this is going require some sharp debridement today as well. Fortunately there is no evidence of active infection. No fevers, chills, nausea,  vomiting, or diarrhea. 05/28/2019 on evaluation today patient's wound actually appears to be measuring smaller upon initial inspection and I do believe that it is doing well. With that being said he continues to have a blister on the outside/lateral edge of the wound that occurs as a result of rubbing due to his dropfoot currently. Subsequently we have filled out the paperwork for his new external brace but again that does take several weeks to get that completed once everything is returned to back we did send this back last week as far as the paperwork was concerned. With that being said there does not appear to be any signs of active infection at this time. 06/04/2019 on evaluation today patient's wound actually appears to be doing slightly better compared to last week which is good news. He still [name], [name] ([id number]) states that his insurance unfortunately does not seem to want to cover any of his new brace. He is going to just pay for this himself. Nonetheless he is going by there today when he leaves here that is [employer] in [place] that is going to be making this brace for him. 06/18/2019 upon evaluation today patient fortunately does have his new brace which is the external AFO brace with bilateral uprights. Subsequently he seems to be doing much better in fact the wound already appears better. He has not even been using the felt he is just using a dressing over top of the wound and overall I am very pleased with how things seem to be progressing. There is no signs of active infection at this time which is great news and I think that the AFO is good to help him tremendously as far as trying to keep pressure off of the 06/25/2019 upon evaluation today patient seems to be making good progress in regard to his wound. He has been tolerating the dressing changes without complication. Fortunately there is no signs of active infection at this time. No fevers, chills, nausea, vomiting, or  diarrhea. 07/02/2019 upon evaluation today patient unfortunately had a blistered area on the bottom of his foot tracking more towards the midline of the plantar aspect. He does not seem to have issues with rubbing more laterally that he used to have but  nonetheless this is still giving him some trouble and causing problems here. Fortunately there is no signs of active infection at this time. No fevers, chills, nausea, vomiting, or diarrhea. 07/09/2019 upon evaluation today patient appears to be doing well with regard to his plantar foot ulcer. Definitely this is much improved compared to last week's visit which again last week the issue there was that he had an area of callus buildup on the plantar aspect of his foot I was able to clean this away and after effectively doing so the wound seems to be doing much better which is great news. There is no signs of active infection at this time. 07/16/2019 upon evaluation today patient appears to be making some progress in my opinion with regard to his plantar foot ulcer. He has been tolerating the dressing changes without complication. Fortunately there is no evidence of active infection at this time which is great news. 07/23/2019 upon evaluation today patient appears to be doing well with regard to his wounds on the plantar foot. He has been tolerating the dressing changes without complication. These do seem to be healing. 08/06/2019 upon evaluation today patient's wound actually appears to be showing some signs of improvement though he still has a significant amount of callus buildup over this location. That is can require some debridement today. With that being said the actual wound openings seem to be dramatically improved which is great news. There is no signs of active infection at this time. No fevers, chills, nausea, vomiting, or diarrhea. 08/13/2019 upon evaluation today patient appears to be doing well with regard to his foot ulcer. He has been tolerating  the dressing changes without complication. Fortunately there is no signs of active infection at this time. No fevers, chills, nausea, vomiting, or diarrhea. 08/20/2019 upon evaluation today patient appears to be doing excellent in regard to his foot ulcer. I do believe he still showing signs of improvement this is very slow but again I think still pressure getting to the area even with his brace and custom shoe is still part of the main issue with the delay here. Nonetheless I do not see any signs of worsening or infection all of which is good news. 5/20; left lateral foot wound. He wears an AFO brace for foot drop related to his diabetes. The wound is small and clean looking he is using silver collagen. We have apparently tried him in offloading shoe he could not tolerate this. He does not have good balance. 09/11/2019 upon evaluation today patient appears to be doing more poorly in regard to his foot ulcer as compared to last time I saw him. Unfortunately he does tell me as we discussed on Friday that he had pus and blood coming from the area underneath the callus. Unfortunately I think that this is a combination of the callus not being trimmed away enough and potentially the felt pad causing an abnormal rubbing which has happened before which led to this becoming more irritated and likely infected based on what I am seeing today. The patient takes ampicillin all the time. Subsequently he also states that he has been trying for a couple of days to take the clindamycin of which she had some leftover from previous. With that being said it upset his stomach. The antibiotic that I sent in for him on Friday unfortunately I wrote the wrong number on as far as the number of pills that he needed. With that being said I corrected that this morning but  he tells me is not can pick it up as that seems to be making him have trouble with his stomach being upset at this point. Fortunately there is no signs of active  infection at this time systemically. 09/17/2019 upon evaluation today patient appears to be doing better in regard to his foot ulcer. Has been taking ampicillin that he had leftover from a previous prescription. With that being said he did go see his doctor today concerning his diabetes and notes that his hemoglobin A1c was 13 obviously this is elevated and there can be working on that. Also did review the culture result that we finally got back for final well that showed rare Pseudomonas and a few group B strep noted. The Pseudomonas was sensitive to Cipro but again the wound is doing so much better with the ampicillin I think this is probably not a causative organism I do not see anything that really has me concerned as far as infection is concerned today. Therefore I would hold off on prescribing Cipro for the patient and have him discontinue the ampicillin until completion. 09/24/2019 upon evaluation today patient appears to be doing 09/24/2019 upon evaluation today patient appears to be doing well with regard to his foot ulcer. He actually has not enlarged any and seems to be measuring smaller quite significantly at this point. His toe ulcer is also doing somewhat better which is good news. Fortunately there is no evidence of active infection at this time. No fevers, chills, nausea, vomiting, or diarrhea. 10/08/2019 upon evaluation today patient appears to be doing well with regard to his foot ulcer all things considered. Fortunately there is no signs of active infection at this time. No fevers, chills, nausea, vomiting, or diarrhea. Fortunately he states that overall he has been doing much better with regard to not have any blistering on the side of his foot. Unfortunately he did have a fall last night due to the fact that he got up to walk without his brace on and did not have his walker either it was around 4 in the morning and his mother was calling out. Subsequently he states that the foot drop  got him and he ended up following. Nonetheless he states he normally uses his walker I explained he needs to try to make sure not to ever go without the walker. No matter how attempting the situation may be. Readmission: 12/03/2019 upon evaluation today patient presents for follow-up after having had surgery last week on his foot for surgical debridement and then subsequent wound VAC placement. He had gone into the hospital as he was having a significant infection and not feeling well. He is unfortunately since I last saw him also been under the care of the cancer doctors and they have been taking good care of him. He does seem to be in fairly high spirits all things considered. With that being said he has lung cancer he tells me that his back from the radiation feels like it is constantly burned. 12/10/2019 on evaluation today patient appears to be doing well with regard to his wound. I do believe the wound VAC is doing a great job at helping to manage his drainage and overall health and to granulate in the wound. He has less undermining noted today in fact almost none. Home health is coming out to change the wound VAC on a regular basis Monday, Wednesday, and Friday. 12/24/2019 on evaluation today patient appears to be doing fairly well in regard to the wound  on the plantar aspect of his foot. With that being said he does have a significant feeling in of what has been going on with regard to the ulceration which does not have the depth that it did in the [place] ([phone]) past. With that being said I do believe that based on what I am seeing at this point the patient likely does need to take a break from the wound VAC and in fact it appears the wound VAC was actually placed directly on the skin as far as the bridging was concerned causing some irritation of the top of the patient's foot. This has me concerned as well that if were not careful that could worsen. I do believe a break with the  wound VAC however will be fine and then we can see where we go from there. 01/07/2020 upon evaluation today patient actually appears to be doing much better in regard to his wound. He has been tolerating the dressing changes without complication. There is no signs of active infection and overall I feel like he is managing quite nicely. I do believe that we would likely discontinue the [facility] altogether today. 10/13; follow-up visit. Patient has a area on the left lateral plantar foot. He has been using Hydrofera Blue and bordered foam he has home health changing this 3 times a week. He is nonambulatory. He avoids pushing the wheelchair with that part of his foot and instead uses his heel. We have had a nice improvement today and overall surface area 10/27; 2-week follow-up. He has been using Hydrofera Blue and bordered foam on a wound on the left lateral foot. He told me he was nonambulatory last time from foot drop although he walked in the clinic with a walker this week apparently did not have any transportation. Also he has a new wound on the right dorsal medial foot which she says was caused by foot where trauma 11/10; 2-week follow-up. We have been using Hydrofera Blue on the left lateral foot roughly at the base of the fifth metatarsal. He came in last visit with a punched out area on the right dorsal foot. He says this was foot where trauma. I reiterated that he is not wearing his previous braces and he is in. He spends most of his time in his wheelchair. He says he is careful not to be pushing himself around in the wheelchair with his feet. He is wearing regular tennis shoes. I taken a bit of time to review the history of the wound on the left foot. This actually was a chronic wound for him. Episodic infections. He had arterial studies done in August these were normal. ABI in the right of 1.05, TBI at 0.65 with triphasic waveforms on the left 1.01 with a TBI of 0.78 and again triphasic  waveforms. Does not appear to be a arterial issue 11/17 we brought the patient in today to look at the area on the right dorsal foot and the left lateral plantar foot. The left was the original wound. Patient states the right dorsal foot happened from trauma from his brace. He is no longer using this. He is mostly in surgical shoes that we provide except when he goes out driving. We changed him to silver collagen last week. He does not have an arterial issue He came to the clinic today got out of his car went to the back of his car felt presyncopal and fell down. When we got out there to help him  up he was alert conversational. Blood pressure little reduced at 616 systolic: We got him in the exam room. Otherwise he looks fine 03/21/2020 upon evaluation today patient appears to be doing decently well in regard to his ulcers on the bilateral feet. Fortunately there is no signs of active infection at this time which is great news. No fever chills noted. He has been tolerating the dressing changes without complication. Fortunately there is no sign of active infection at this time. No fevers, chills, nausea, vomiting, or diarrhea. 04/04/2020 upon evaluation today patient's wounds actually are showing signs of improvement at both locations. There is good be some sharp debridement necessary on the plantar foot but overall not much compared to where things have been in the past. Overall I feel like he is doing quite well 04/18/2020 on evaluation today patient appears to be doing well with regard to his wounds currently. Fortunately there is no signs of active infection at this time. No fevers, chills, nausea, vomiting, or diarrhea. With that being said the patient is continuing to experience some issues with open wound on the right foot on the left though this is not draining it does appear to show some signs of callus currently. 05/02/2020 upon evaluation today patient appears to be doing excellent. His left  foot is healed the right foot is actually showing signs of improvement and significantly smaller it still is not completely closed however. In general I am very pleased with where things stand currently. 05/09/2020 upon evaluation today patient appears to be doing well with regard to his foot ulcer. In fact this appears to be completely healed based on what I am seeing currently. I do not see any evidence of infection and the patient is having no discomfort. No fevers, chills, nausea, vomiting, or diarrhea. Objective Constitutional Well-nourished and well-hydrated in no acute distress. Vitals Time Taken: 11:06 AM, Height: 71 in, Weight: 190 lbs, BMI: 26.5, Temperature: 97.9 F, Pulse: 163 bpm, Respiratory Rate: 18 breaths/min, Blood Pressure: 148/89 mmHg. Respiratory normal breathing without difficulty. Psychiatric this patient is able to make decisions and demonstrates good insight into disease process. Alert and Oriented x 3. pleasant and cooperative. General Notes: Upon inspection patient's wound bed actually showed signs of good granulation at this time. There does not appear to be any evidence of active infection which is great news overall very pleased with where things stand. No fevers, chills, nausea, vomiting, or diarrhea. Integumentary (Hair, Skin) Wound #9 status is Healed - Epithelialized. Original cause of wound was Pressure Injury. The wound is located on the Right,Dorsal Foot. The wound measures 0cm length x 0cm width x 0cm depth; 0cm^2 area and 0cm^3 volume. There is no tunneling or undermining noted. There is a [name], [name]. ([id number]) none present amount of drainage noted. The wound margin is flat and intact. There is no granulation within the wound bed. There is no necrotic tissue within the wound bed. 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 I am going to recommend at this point that we go ahead and discontinue wound care services as the patient appears to be completely healed he is in agreement with  the plan. Obviously if anything changes he knows he can always contact the office and let me know. 2. I am also can recommend that the patient continue to stay off of his foot for the next week. After that he can then start to slowly get back into walking using his regular shoes he is excited about this. We will see him back for follow-up visit as needed. Electronic Signature(s) Signed: 05/09/2020 3:34:40 PM By: Worthy Keeler [name] Entered By: Worthy Keeler on 05/09/2020 15:34:39 [name] ([id number]) -------------------------------------------------------------------------------- SuperBill Details Patient Name: [name] Date of Service: 05/09/2020 Medical Record Number: [id number] Patient Account Number: [id number] Date of Birth/Sex: 19-Apr-1945 (75 y.o. M) Treating RN: Dolan Amen Primary Care Provider: [name] Other Clinician: Referring Provider: [name] Treating Provider/Extender: Skipper Cliche in Treatment: 22 Diagnosis Coding ICD-10 Codes Code Description E11.621 Type 2 diabetes mellitus with foot ulcer L97.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scle L97.512 Non-pressure chronic ulcer of other part of right foot with fat layer exposed E11.42 Type 2 diabetes mellitus with diabetic polyneuropathy M21.372 Foot drop, left foot Facility Procedures CPT4 Code: [id number] Description: [id number] - WOUND CARE VISIT-LEV 2 EST PT Modifier: Quantity: 1 Physician Procedures CPT4 Code:  [id number] Description: [id number] - WC PHYS LEVEL 3 - EST PT Modifier: Quantity: 1 CPT4 Code: Description: ICD-10 Diagnosis Description E11.621 Type 2 diabetes mellitus with foot ulcer L97.523 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 L97.512 Non-pressure chronic ulcer of other part of right foot with fat layer e  E11.42 Type 2 diabetes mellitus with diabetic polyneuropathy Modifier: muscle xposed Quantity: Electronic Signature(s) Signed: 05/09/2020 3:35:07 PM By: Worthy Keeler [name] Entered By: Worthy Keeler on 05/09/2020 15:35:06

## 2020-05-09 NOTE — Progress Notes (Signed)
EMANUELLE, [name] ([id number]) Visit Report for 05/09/2020 Arrival Information Details Patient Name: [name], [name] Date of Service: 05/09/2020 11:00 AM Medical Record Number: [id number] Patient Account Number: [id number] Date of Birth/Sex: March 01, 1946 (74 y.o. M) Treating RN: Dolan Amen Primary Care Molina Hollenback: [name] Other Clinician: Referring Shavaun Osterloh: [name] Treating Veneda Kirksey/Extender: Skipper Cliche in Treatment: 34 Visit Information History Since Last Visit Added or deleted any medications: No Patient Arrived: Wheel Chair Any new allergies or adverse reactions: No Arrival Time: 11:05 Had a fall or experienced change in No Accompanied By: self activities of daily living that may affect Transfer Assistance: None risk of falls: Patient Identification Verified: Yes Hospitalized since last visit: No Secondary Verification Process Completed: Yes Pain Present Now: No Patient Requires Transmission-Based Precautions: No Patient Has Alerts: No Electronic Signature(s) Signed: 05/09/2020 4:39:20 PM By: Georges Mouse, Minus Breeding RN Entered By: Georges Mouse, Minus Breeding on 05/09/2020 11:06:31 [name] ([id number]) -------------------------------------------------------------------------------- Clinic Level of Care Assessment Details Patient Name: [name] Date of Service: 05/09/2020 11:00 AM Medical Record Number: [id number] Patient Account Number: [id number] Date of Birth/Sex: Jun 13, 1945 (74 y.o. M) Treating RN: Dolan Amen Primary Care Kataleyah Carducci: [name] Other Clinician: Referring Farzana Koci: [name] Treating Lenorris Karger/Extender: Skipper Cliche in Treatment: 22 Clinic Level of Care Assessment Items TOOL 4 Quantity Score X - Use when only an EandM is performed on FOLLOW-UP visit 1 0 ASSESSMENTS - Nursing Assessment / Reassessment X - Reassessment of Co-morbidities (includes updates in patient status) 1 10 X- 1 5 Reassessment of  Adherence to Treatment Plan ASSESSMENTS - Wound and Skin Assessment / Reassessment X - Simple Wound Assessment / Reassessment - one wound 1 5 []  - 0 Complex Wound Assessment / Reassessment - multiple wounds []  - 0 Dermatologic / Skin Assessment (not related to wound area) ASSESSMENTS - Focused Assessment []  - Circumferential Edema Measurements - multi extremities 0 []  - 0 Nutritional Assessment / Counseling / Intervention []  - 0 Lower Extremity Assessment (monofilament, tuning fork, pulses) []  - 0 Peripheral Arterial Disease Assessment (using hand held doppler) ASSESSMENTS - Ostomy and/or Continence Assessment and Care []  - Incontinence Assessment and Management 0 []  - 0 Ostomy Care Assessment and Management (repouching, etc.) PROCESS - Coordination of Care X - Simple Patient / Family Education for ongoing care 1 15 []  - 0 Complex (extensive) Patient / Family Education for ongoing care []  - 0 Staff obtains Programmer, systems, Records, Test Results / Process Orders []  - 0 Staff telephones HHA, Nursing Homes / Clarify orders / etc []  - 0 Routine Transfer to another Facility (non-emergent condition) []  - 0 Routine Hospital Admission (non-emergent condition) []  - 0 New Admissions / Biomedical engineer / Ordering NPWT, Apligraf, etc. []  - 0 Emergency Hospital Admission (emergent condition) X- 1 10 Simple Discharge Coordination []  - 0 Complex (extensive) Discharge Coordination PROCESS - Special Needs []  - Pediatric / Minor Patient Management 0 []  - 0 Isolation Patient Management []  - 0 Hearing / Language / Visual special needs []  - 0 Assessment of Community assistance (transportation, D/C planning, etc.) []  - 0 Additional assistance / Altered mentation []  - 0 Support Surface(s) Assessment (bed, cushion, seat, etc.) INTERVENTIONS - Wound Cleansing / Measurement [name], Dustin E. ([id number]) []  - 0 Simple Wound Cleansing - one wound []  - 0 Complex Wound Cleansing - multiple  wounds X- 1 5 Wound Imaging (photographs - any number of wounds) []  - 0 Wound Tracing (instead of photographs) X- 1 5 Simple Wound Measurement - one wound []  -  0 Complex Wound Measurement - multiple wounds INTERVENTIONS - Wound Dressings []  - Small Wound Dressing one or multiple wounds 0 []  - 0 Medium Wound Dressing one or multiple wounds []  - 0 Large Wound Dressing one or multiple wounds []  - 0 Application of Medications - topical []  - 0 Application of Medications - injection INTERVENTIONS - Miscellaneous []  - External ear exam 0 []  - 0 Specimen Collection (cultures, biopsies, blood, body fluids, etc.) []  - 0 Specimen(s) / Culture(s) sent or taken to Lab for analysis []  - 0 Patient Transfer (multiple staff / Civil Service fast streamer / Similar devices) []  - 0 Simple Staple / Suture removal (25 or less) []  - 0 Complex Staple / Suture removal (26 or more) []  - 0 Hypo / Hyperglycemic Management (close monitor of Blood Glucose) []  - 0 Ankle / Brachial Index (ABI) - do not check if billed separately X- 1 5 Vital Signs Has the patient been seen at the hospital within the last three years: Yes Total Score: 60 Level Of Care: New/Established - Level 2 Electronic Signature(s) Signed: 05/09/2020 4:39:20 PM By: Georges Mouse, Minus Breeding RN Entered By: Georges Mouse, Minus Breeding on 05/09/2020 11:21:20 [name] ([id number]) -------------------------------------------------------------------------------- Encounter Discharge Information Details Patient Name: [name] Date of Service: 05/09/2020 11:00 AM Medical Record Number: [id number] Patient Account Number: [id number] Date of Birth/Sex: [birth year] (74 y.o. M) Treating RN: Dolan Amen Primary Care Mae Cianci: [name] Other Clinician: Referring Keveon Amsler: [name] Treating Shanquita Ronning/Extender: Skipper Cliche in Treatment: 22 Encounter Discharge Information Items Discharge Condition: Stable Ambulatory Status:  Wheelchair Discharge Destination: Home Transportation: Private Auto Accompanied By: self Schedule Follow-up Appointment: No Clinical Summary of Care: Electronic Signature(s) Signed: 05/09/2020 4:39:20 PM By: Georges Mouse, Minus Breeding RN Entered By: Georges Mouse, Minus Breeding on 05/09/2020 11:24:46 [name] ([id number]) -------------------------------------------------------------------------------- Lower Extremity Assessment Details Patient Name: [name] Date of Service: 05/09/2020 11:00 AM Medical Record Number: [id number] Patient Account Number: [id number] Date of Birth/Sex: Mar 10, 1946 (74 y.o. M) Treating RN: Dolan Amen Primary Care Kaelea Gathright: [name] Other Clinician: Referring Delisa Finck: [name] Treating Bharath Bernstein/Extender: Jeri Cos Weeks in Treatment: 22 Edema Assessment Assessed: [Left: No] [Right: Yes] Edema: [Left: N] [Right: o] Vascular Assessment Pulses: Dorsalis Pedis Palpable: [Right:Yes] Electronic Signature(s) Signed: 05/09/2020 4:39:20 PM By: Georges Mouse, Minus Breeding RN Entered By: Georges Mouse, Minus Breeding on 05/09/2020 11:16:11 [name] ([id number]) -------------------------------------------------------------------------------- Multi Wound Chart Details Patient Name: [name] Date of Service: 05/09/2020 11:00 AM Medical Record Number: [id number] Patient Account Number: [id number] Date of Birth/Sex: [birth year] (74 y.o. M) Treating RN: Dolan Amen Primary Care Ardythe Klute: [name] Other Clinician: Referring Natash Berman: [name] Treating Jamier Urbas/Extender: Skipper Cliche in Treatment: 22 Vital Signs Height(in): 71 Pulse(bpm): 163 Weight(lbs): 190 Blood Pressure(mmHg): 148/89 Body Mass Index(BMI): 26 Temperature(F): 97.9 Respiratory Rate(breaths/min): 18 Photos: [N/A:N/A] Wound Location: Right, Dorsal Foot N/A N/A Wounding Event: Pressure Injury N/A N/A Primary Etiology: Diabetic Wound/Ulcer  of the Lower N/A N/A Extremity Secondary Etiology: Pressure Ulcer N/A N/A Comorbid History: Cataracts, Middle ear problems, N/A N/A Sleep Apnea, Hypertension, Type II Diabetes, Gout, Osteoarthritis, Neuropathy, Received Chemotherapy Date Acquired: 01/23/2020 N/A N/A Weeks of Treatment: 13 N/A N/A Wound Status: Healed - Epithelialized N/A N/A Measurements L x W x D (cm) 0x0x0 N/A N/A Area (cm) : 0 N/A N/A Volume (cm) : 0 N/A N/A % Reduction in Area: 100.00% N/A N/A % Reduction in Volume: 100.00% N/A N/A Classification: Grade 2 N/A N/A Exudate Amount: None Present N/A N/A Wound Margin: Flat and Intact  N/A N/A Granulation Amount: None Present (0%) N/A N/A Necrotic Amount: None Present (0%) N/A N/A Exposed Structures: Fascia: No N/A N/A Fat Layer (Subcutaneous Tissue): No Tendon: No Muscle: No Joint: No Bone: No Epithelialization: Large (67-100%) N/A N/A Treatment Notes Electronic Signature(s) Signed: 05/09/2020 4:39:20 PM By: Georges Mouse, Minus Breeding RN Entered By: Georges Mouse, Minus Breeding on 05/09/2020 11:20:16 [name] ([id number]) -------------------------------------------------------------------------------- [place] Details Patient Name: [name] Date of Service: 05/09/2020 11:00 AM Medical Record Number: [id number] Patient Account Number: [id number] Date of Birth/Sex: [birth year] (74 y.o. M) Treating RN: Dolan Amen Primary Care Tyrail Grandfield: [name] Other Clinician: Referring Aleck Locklin: [name] Treating Rachna Schonberger/Extender: Skipper Cliche in Treatment: 22 Active Inactive Electronic Signature(s) Signed: 05/09/2020 4:39:20 PM By: Georges Mouse, Minus Breeding RN Entered By: Georges Mouse, Minus Breeding on 05/09/2020 11:20:10 [name] ([id number]) -------------------------------------------------------------------------------- Pain Assessment Details Patient Name: [name] Date of Service: 05/09/2020 11:00  AM Medical Record Number: [id number] Patient Account Number: [id number] Date of Birth/Sex: January 04, 1946 (74 y.o. M) Treating RN: Dolan Amen Primary Care Kryslyn Helbig: [name] Other Clinician: Referring Milcah Dulany: [name] Treating Laysa Kimmey/Extender: Skipper Cliche in Treatment: 22 Active Problems Location of Pain Severity and Description of Pain Patient Has Paino No Site Locations Rate the pain. Current Pain Level: 0 Pain Management and Medication Current Pain Management: Electronic Signature(s) Signed: 05/09/2020 4:39:20 PM By: Georges Mouse, Minus Breeding RN Entered By: Georges Mouse, [place] on 05/09/2020 11:09:41 [name] ([id number]) -------------------------------------------------------------------------------- Patient/Caregiver Education Details Patient Name: [name] Date of Service: 05/09/2020 11:00 AM Medical Record Number: [id number] Patient Account Number: [id number] Date of Birth/Gender: 04-17-1945 (74 y.o. M) Treating RN: Dolan Amen Primary Care Physician: [name] Other Clinician: Referring Physician: [name] Treating Physician/Extender: Skipper Cliche in Treatment: 22 Education Assessment Education Provided To: Patient Education Topics Provided Notes continue keeping area covered for Multimedia programmer) Signed: 05/09/2020 4:39:20 PM By: Georges Mouse, Minus Breeding RN Entered By: Georges Mouse, Minus Breeding on 05/09/2020 11:24:19 [name] ([id number]) -------------------------------------------------------------------------------- Wound Assessment Details Patient Name: [name] Date of Service: 05/09/2020 11:00 AM Medical Record Number: [id number] Patient Account Number: [id number] Date of Birth/Sex: 06/27/1945 (74 y.o. M) Treating RN: Dolan Amen Primary Care Jahiem Franzoni: [name] Other Clinician: Referring Tafari Humiston: [name] Treating Anaclara Acklin/Extender: Skipper Cliche  in Treatment: 22 Wound Status Wound Number: 9 Primary Diabetic Wound/Ulcer of the Lower Extremity Etiology: Wound Location: Right, Dorsal Foot Secondary Pressure Ulcer Wounding Event: Pressure Injury Etiology: Date Acquired: 01/23/2020 Wound Healed - Epithelialized Weeks Of Treatment: 13 Status: Clustered Wound: No Comorbid Cataracts, Middle ear problems, Sleep Apnea, History: Hypertension, Type II Diabetes, Gout, Osteoarthritis, Neuropathy, Received Chemotherapy Photos Wound Measurements Length: (cm) 0 % Re Width: (cm) 0 % Re Depth: (cm) 0 Epit Area: (cm) 0 Tun Volume: (cm) 0 Und duction in Area: 100% duction in Volume: 100% helialization: Large (67-100%) neling: No ermining: No Wound Description Classification: Grade 2 Fou Wound Margin: Flat and Intact Slo Exudate Amount: None Present l Odor After Cleansing: No ugh/Fibrino No Wound Bed Granulation Amount: None Present (0%) Exposed Structure Necrotic Amount: None Present (0%) Fascia Exposed: No Fat Layer (Subcutaneous Tissue) Exposed: No Tendon Exposed: No Muscle Exposed: No Joint Exposed: No Bone Exposed: No Treatment Notes Wound #9 (Foot) Wound Laterality: Dorsal, Right Cleanser Peri-Wound Care Topical [name], [name] ([id number]) Primary Dressing Secondary Dressing Secured With Compression Wrap Compression Stockings Add-Ons Electronic Signature(s) Signed: 05/09/2020 4:39:20 PM By: Georges Mouse, Minus Breeding RN Entered By: Georges Mouse, Minus Breeding on 05/09/2020 11:20:01  [name], [name] ([id number]) -------------------------------------------------------------------------------- Vitals Details Patient Name: [name], [name] Date of Service: 05/09/2020 11:00 AM Medical Record Number: [id number] Patient Account Number: [id number] Date of Birth/Sex: 1945-07-10 (74 y.o. M) Treating RN: Dolan Amen Primary Care Ardyn Forge: [name] Other Clinician: Referring Raneisha Bress: [name] Treating  Lynsi Dooner/Extender: Skipper Cliche in Treatment: 22 Vital Signs Time Taken: 11:06 Temperature (F): 97.9 Height (in): 71 Pulse (bpm): 163 Weight (lbs): 190 Respiratory Rate (breaths/min): 18 Body Mass Index (BMI): 26.5 Blood Pressure (mmHg): 148/89 Reference Range: 80 - 120 mg / dl Electronic Signature(s) Signed: 05/09/2020 4:39:20 PM By: Georges Mouse, Minus Breeding RN Entered By: Georges Mouse, Minus Breeding on 05/09/2020 11:09:33

## 2020-05-12 ENCOUNTER — Telehealth: Payer: Self-pay

## 2020-05-12 NOTE — Telephone Encounter (Signed)
Brought to this nurses attention that pt was on Dr. Rometta Emery schedule with note of SOB, trouble breathing x 3 days and pneumonia. Pt was scheduled for in office apt.  Contacted pt who reports hx of lung cancer and will start radiation this week.  Pt has SOB for 3 days and difficulty breathing due to chest congestion, which he thinks is pneumonia. Pt reports he gets this every year and he is normally prescribed abx. He is currently using Mucinex. Pt denies fever, HA, sore throat, N/V/D or any other symptoms. Pt did home covid test today and it was neg.  No audible difficulty breathing heard. No coughing during phone conversation.  Pt not capable of performing video visit but has done phone visits in the past. Advised his a pt will be changed to a phone visit and if testing or x-ray is needed they can be ordered. Advised pt if his symptoms worsen or he develops new symptoms to call 911. Advised pt of ER precautions.   Changed apt tomorrow to telephone visit.

## 2020-05-13 ENCOUNTER — Other Ambulatory Visit: Payer: Self-pay

## 2020-05-13 ENCOUNTER — Other Ambulatory Visit (INDEPENDENT_AMBULATORY_CARE_PROVIDER_SITE_OTHER): Payer: Medicare Other

## 2020-05-13 ENCOUNTER — Encounter: Payer: Self-pay | Admitting: Family Medicine

## 2020-05-13 ENCOUNTER — Ambulatory Visit
Admission: RE | Admit: 2020-05-13 | Discharge: 2020-05-13 | Disposition: A | Discharge status: Home / Self Care | Payer: Medicare Other | Source: Ambulatory Visit | Attending: Family Medicine | Admitting: Family Medicine

## 2020-05-13 ENCOUNTER — Telehealth (INDEPENDENT_AMBULATORY_CARE_PROVIDER_SITE_OTHER): Payer: Medicare Other | Admitting: Family Medicine

## 2020-05-13 ENCOUNTER — Other Ambulatory Visit: Payer: Self-pay | Admitting: *Deleted

## 2020-05-13 VITALS — BP 128/82 | HR 124 | Temp 97.3°F | Ht 71.0 in

## 2020-05-13 DIAGNOSIS — R0602 Shortness of breath: Secondary | ICD-10-CM

## 2020-05-13 DIAGNOSIS — C3431 Malignant neoplasm of lower lobe, right bronchus or lung: Secondary | ICD-10-CM | POA: Diagnosis not present

## 2020-05-13 DIAGNOSIS — J90 Pleural effusion, not elsewhere classified: Secondary | ICD-10-CM | POA: Diagnosis not present

## 2020-05-13 DIAGNOSIS — C7931 Secondary malignant neoplasm of brain: Secondary | ICD-10-CM | POA: Insufficient documentation

## 2020-05-13 DIAGNOSIS — R059 Cough, unspecified: Secondary | ICD-10-CM | POA: Diagnosis not present

## 2020-05-13 DIAGNOSIS — J9811 Atelectasis: Secondary | ICD-10-CM | POA: Diagnosis not present

## 2020-05-13 DIAGNOSIS — Z298 Encounter for other specified prophylactic measures: Secondary | ICD-10-CM | POA: Diagnosis not present

## 2020-05-13 LAB — POC INFLUENZA A&B (BINAX/QUICKVUE)
Influenza A, POC: NEGATIVE
Influenza B, POC: NEGATIVE

## 2020-05-13 NOTE — Progress Notes (Addendum)
VIRTUAL VISIT Due to national recommendations of social distancing due to [place] 19, a virtual visit is felt to be most appropriate for this patient at this time.   I connected with the patient on 05/13/20 at 11:40 AM EST by virtual telehealth platform and verified that I am speaking with the correct person using two identifiers.   I discussed the limitations, risks, security and privacy concerns of performing an evaluation and management service by  virtual telehealth platform and the availability of in person appointments. I also discussed with the patient that there may be a patient responsible charge related to this service. The patient expressed understanding and agreed to proceed.  Interactive audio and video telecommunications were attempted between this provider and patient, however failed, due to patient having technical difficulties OR patient did not have access to video capability.  We continued and completed visit with audio only.   Patient location: Home Provider Location:  [facility] Participants: Eliezer Lofts and [name]   Chief Complaint  Patient presents with  . 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 yellow  brown mucus.  No fever.  No myalgia, no headache. No ear pain, mild ST.  No N/VD.  Good po intake, drinking water and Gatorade.  Using mucinex, cough drops.   No leg swelling or calf pain.  Not currently on chemothepathy Last CBC 04/07/21 showed Hg 8.1 normal wbc.  COVID 19 screen COVID testing: negative home covid test 05/12/20 COVID vaccine: COVID x 3 COVID exposure: No recent travel or known exposure to [place]  The importance of social distancing was  discussed today.     Review of Systems  Constitutional: Negative for chills and fever.  HENT: Negative for congestion and ear pain.   Eyes: Negative for pain and redness.  Respiratory: Positive for cough and shortness of breath.   Cardiovascular: Negative for chest pain, palpitations and leg swelling.  Gastrointestinal: Negative for abdominal pain, blood in stool, constipation, diarrhea, nausea and vomiting.  Genitourinary: Negative for dysuria.  Musculoskeletal: Negative for falls and myalgias.  Skin: Negative for rash.  Neurological: Negative for dizziness.  Psychiatric/Behavioral: Negative for depression. The patient is not nervous/anxious.       Past Medical History:  Diagnosis Date  . Allergic rhinitis due to pollen   . Chronic kidney disease, stage III (moderate) ([place]) 12/19/2012  . Depression   . Diabetes mellitus ([place])   . Diverticulosis   . Erectile dysfunction associated with type 2 diabetes mellitus ([place]) 05/28/2015  . Former very heavy cigarette smoker (more than 40 per day) 10/07/2014  . GERD (gastroesophageal reflux disease)   . Gout   . Hyperlipidemia   . Hypertension   . Hypogonadism male 06/14/2012  . Memory loss   . Osteoarthritis   . Personal history of colonic polyps    1996  . Small cell carcinoma of right lung ([place]) 10/23/2019    reports that he quit smoking about 20 years ago. His smoking use included cigarettes. He has a 105.00 pack-year smoking history. He has never used smokeless tobacco. He reports that he does not drink alcohol and does not use drugs.   Current Outpatient Medications:  .  acetaminophen (TYLENOL) 650 MG CR tablet, Take 650 mg by mouth every 8 (eight) hours as needed for pain., Disp: , Rfl:  .  albuterol (VENTOLIN HFA) 108 (90 Base) MCG/ACT inhaler, Inhale 1-2 puffs into the lungs every 6 (six) hours as needed for wheezing or shortness of breath., Disp: 8 g, Rfl: 2 .  Ascorbic Acid (VITAMIN C) 1000 MG tablet, Take 1,000 mg by mouth  daily., Disp: , Rfl:  .  cholecalciferol (VITAMIN D) 1000 UNITS tablet, Take 2,000 Units by mouth daily., Disp: , Rfl:  .  diltiazem (CARDIZEM CD) 240 MG 24 hr capsule, Take 1 capsule (240 mg total) by mouth daily., Disp: 30 capsule, Rfl: 2 .  donepezil (ARICEPT) 10 MG tablet, TAKE 1 TABLET BY MOUTH AT BEDTIME, Disp: 90 tablet, Rfl: 3 .  fluticasone (FLONASE) 50 MCG/ACT nasal spray, Place 1 spray into both nostrils daily., Disp: , Rfl:  .  guaiFENesin (MUCINEX) 600 MG 12 hr tablet, Take 600 mg by mouth daily as needed for cough., Disp: , Rfl:  .  Hyprom-Naphaz-Polysorb-Zn Sulf (CLEAR EYES COMPLETE OP), Place 1 drop into both eyes daily as needed (dry eyes)., Disp: , Rfl:  .  Insulin Pen Needle (PEN NEEDLES) 31G X 5 MM MISC, 1 each by Does not apply route 3 (three) times daily. To inject insulin E11.42, Disp: 90 each, Rfl: 2 .  insulin regular human CONCENTRATED (HUMULIN R U-500 KWIKPEN) 500 UNIT/ML kwikpen, Inject 40 Units into the skin with breakfast, with lunch, and with evening meal., Disp: , Rfl:  .  memantine (NAMENDA) 10 MG tablet, TAKE 1 TABLET BY MOUTH TWICE A DAY, Disp: 180 tablet, Rfl: 3 .  montelukast (SINGULAIR) 10 MG tablet, Take 1 tablet (10 mg total) by mouth at bedtime., Disp: 30 tablet, Rfl: 3 .  pantoprazole (PROTONIX) 20 MG tablet, Take 1 tablet (20 mg total) by mouth daily., Disp: 30 tablet, Rfl: 2 .  prochlorperazine (COMPAZINE) 10 MG tablet, Take 1 tablet (10 mg total) by mouth every 6 (six) hours as needed., Disp: 30 tablet, Rfl: 2 .  Semaglutide (OZEMPIC, 0.25 OR 0.5 MG/DOSE, [place]), Inject 0.25 mg into the skin once a week. Sundays, Disp: , Rfl:  .  simvastatin (ZOCOR) 40 MG tablet, TAKE 1 TABLET BY MOUTH AT BEDTIME, Disp: 90 tablet, Rfl: 2 .  venlafaxine XR (EFFEXOR-XR) 75 MG 24 hr capsule, TAKE 1 CAPSULE BY MOUTH EVERY MORNING AND 2 CAPSULES AT BEDTIME, Disp: 270 capsule, Rfl: 1   Observations/Objective: Blood pressure 128/82, pulse (!) 124, temperature (!) 97.3 F (36.3  C), temperature source Tympanic, height 5\' 11"  (1.803 m).  Physical Exam  Physical Exam Constitutional:      General: The patient is not in acute distress. Pulmonary:     Effort: Pulmonary effort is normal. No respiratory distress.  Neurological:     Mental Status: The patient is alert and oriented to person, place, and time.  Psychiatric:        Mood and Affect: Mood normal.        Behavior: Behavior normal.   Assessment and Plan Problem List Items Addressed This Visit    Shortness of breath - Primary     Will eval with labs and CXR, but given  Inability to examine pt and complexity of case... very low threshold for in person ER exam.  pt given ER precautions and return precautions.      Relevant Orders   POC Influenza A&B(BINAX/QUICKVUE) (Completed)   Novel Coronavirus, NAA ([facility]) (Completed)  Comprehensive metabolic panel (Completed)   DG Chest 2 View (Completed)   CBC with Differential/Platelet (Completed)        I discussed the assessment and treatment plan with the patient. The patient was provided an opportunity to ask questions and all were answered. The patient agreed with the plan and demonstrated an understanding of the instructions.   The patient was advised to call back or seek an in-person evaluation if the symptoms worsen or if the condition fails to improve as anticipated.   Total visit time 20 minutes, > 50% spent counseling and cordinating patients care.  Eliezer Lofts, MD

## 2020-05-13 NOTE — Patient Outreach (Signed)
[facility] [facility]) Care Management  05/13/2020  BRYSON GAVIA 1945-06-07 [id number]   Outgoing call placed to member, state he is not doing well.  He has been having trouble breathing.  Telephone visit completed today with PCP, will be going to [place] later for labs and x-ray.  His grandson will provide transportation.  He has checked oxygen level, report reading was 92%, denies productive cough or fever.  Does not feel like holding long conversation today, provided information and education on when to present to ED if symptoms worsen.  Will follow up with member within the next week.  [name], [place], MSN [place] [phone]

## 2020-05-13 NOTE — Patient Instructions (Signed)
XRAY Locations:  (Please go to the choice of location you gave your provider)   1.   [place] AREA:  A.  [facility]:  [place]      (Go to [employer] of hospital)         [place], [place] [address]  NOTE:  Enter through the front doors as normal and inform volunteer services that you are here for Radiology and have sick symptoms.          *    Please make sure that you are wearing a mask.    2.     AREA:    A.   [facility]:  [place]    ([place])                                                                       [place], [place] [address]  NOTE:  Please check in at the main desk and inform them that you are there for Radiology and have sick symptoms.    [place]:   [place], [place] [address]                                             *    Please make sure that you are wearing a mask.

## 2020-05-14 ENCOUNTER — Telehealth: Payer: Self-pay | Admitting: Family Medicine

## 2020-05-14 ENCOUNTER — Other Ambulatory Visit: Payer: Self-pay

## 2020-05-14 ENCOUNTER — Encounter (HOSPITAL_COMMUNITY): Payer: Self-pay

## 2020-05-14 ENCOUNTER — Ambulatory Visit: Payer: Medicare Other | Admitting: Radiation Oncology

## 2020-05-14 ENCOUNTER — Emergency Department (HOSPITAL_COMMUNITY): Payer: Medicare Other

## 2020-05-14 ENCOUNTER — Inpatient Hospital Stay (HOSPITAL_COMMUNITY)
Admission: EM | Admit: 2020-05-14 | Discharge: 2020-05-21 | DRG: 177 | Disposition: A | Discharge status: Home Health | Payer: Medicare Other | Attending: Internal Medicine | Admitting: Internal Medicine

## 2020-05-14 ENCOUNTER — Telehealth: Payer: Self-pay | Admitting: Radiology

## 2020-05-14 DIAGNOSIS — G620 Drug-induced polyneuropathy: Secondary | ICD-10-CM | POA: Diagnosis present

## 2020-05-14 DIAGNOSIS — D631 Anemia in chronic kidney disease: Secondary | ICD-10-CM | POA: Diagnosis present

## 2020-05-14 DIAGNOSIS — Z923 Personal history of irradiation: Secondary | ICD-10-CM | POA: Diagnosis not present

## 2020-05-14 DIAGNOSIS — Z801 Family history of malignant neoplasm of trachea, bronchus and lung: Secondary | ICD-10-CM

## 2020-05-14 DIAGNOSIS — I4891 Unspecified atrial fibrillation: Secondary | ICD-10-CM | POA: Diagnosis not present

## 2020-05-14 DIAGNOSIS — Z825 Family history of asthma and other chronic lower respiratory diseases: Secondary | ICD-10-CM

## 2020-05-14 DIAGNOSIS — T451X5A Adverse effect of antineoplastic and immunosuppressive drugs, initial encounter: Secondary | ICD-10-CM | POA: Diagnosis present

## 2020-05-14 DIAGNOSIS — R059 Cough, unspecified: Secondary | ICD-10-CM | POA: Diagnosis not present

## 2020-05-14 DIAGNOSIS — E876 Hypokalemia: Secondary | ICD-10-CM | POA: Diagnosis not present

## 2020-05-14 DIAGNOSIS — R0602 Shortness of breath: Secondary | ICD-10-CM | POA: Diagnosis present

## 2020-05-14 DIAGNOSIS — J9601 Acute respiratory failure with hypoxia: Secondary | ICD-10-CM | POA: Diagnosis not present

## 2020-05-14 DIAGNOSIS — Z8249 Family history of ischemic heart disease and other diseases of the circulatory system: Secondary | ICD-10-CM

## 2020-05-14 DIAGNOSIS — I4892 Unspecified atrial flutter: Secondary | ICD-10-CM | POA: Diagnosis not present

## 2020-05-14 DIAGNOSIS — I129 Hypertensive chronic kidney disease with stage 1 through stage 4 chronic kidney disease, or unspecified chronic kidney disease: Secondary | ICD-10-CM | POA: Diagnosis present

## 2020-05-14 DIAGNOSIS — K219 Gastro-esophageal reflux disease without esophagitis: Secondary | ICD-10-CM | POA: Diagnosis not present

## 2020-05-14 DIAGNOSIS — E1122 Type 2 diabetes mellitus with diabetic chronic kidney disease: Secondary | ICD-10-CM | POA: Diagnosis present

## 2020-05-14 DIAGNOSIS — N1831 Chronic kidney disease, stage 3a: Secondary | ICD-10-CM | POA: Diagnosis present

## 2020-05-14 DIAGNOSIS — I480 Paroxysmal atrial fibrillation: Secondary | ICD-10-CM | POA: Diagnosis present

## 2020-05-14 DIAGNOSIS — R531 Weakness: Secondary | ICD-10-CM

## 2020-05-14 DIAGNOSIS — Z79899 Other long term (current) drug therapy: Secondary | ICD-10-CM

## 2020-05-14 DIAGNOSIS — J9811 Atelectasis: Secondary | ICD-10-CM | POA: Diagnosis not present

## 2020-05-14 DIAGNOSIS — Z96649 Presence of unspecified artificial hip joint: Secondary | ICD-10-CM | POA: Diagnosis present

## 2020-05-14 DIAGNOSIS — J90 Pleural effusion, not elsewhere classified: Secondary | ICD-10-CM | POA: Diagnosis present

## 2020-05-14 DIAGNOSIS — E785 Hyperlipidemia, unspecified: Secondary | ICD-10-CM | POA: Diagnosis present

## 2020-05-14 DIAGNOSIS — Z87891 Personal history of nicotine dependence: Secondary | ICD-10-CM

## 2020-05-14 DIAGNOSIS — C3490 Malignant neoplasm of unspecified part of unspecified bronchus or lung: Secondary | ICD-10-CM

## 2020-05-14 DIAGNOSIS — E119 Type 2 diabetes mellitus without complications: Secondary | ICD-10-CM

## 2020-05-14 DIAGNOSIS — U071 COVID-19: Secondary | ICD-10-CM | POA: Diagnosis not present

## 2020-05-14 DIAGNOSIS — C3491 Malignant neoplasm of unspecified part of right bronchus or lung: Secondary | ICD-10-CM | POA: Diagnosis present

## 2020-05-14 DIAGNOSIS — I2510 Atherosclerotic heart disease of native coronary artery without angina pectoris: Secondary | ICD-10-CM | POA: Diagnosis present

## 2020-05-14 DIAGNOSIS — F32A Depression, unspecified: Secondary | ICD-10-CM | POA: Diagnosis present

## 2020-05-14 DIAGNOSIS — Z823 Family history of stroke: Secondary | ICD-10-CM

## 2020-05-14 DIAGNOSIS — Z820 Family history of epilepsy and other diseases of the nervous system: Secondary | ICD-10-CM

## 2020-05-14 DIAGNOSIS — D61810 Antineoplastic chemotherapy induced pancytopenia: Secondary | ICD-10-CM | POA: Diagnosis not present

## 2020-05-14 DIAGNOSIS — E041 Nontoxic single thyroid nodule: Secondary | ICD-10-CM | POA: Diagnosis not present

## 2020-05-14 DIAGNOSIS — R911 Solitary pulmonary nodule: Secondary | ICD-10-CM | POA: Diagnosis not present

## 2020-05-14 DIAGNOSIS — Z833 Family history of diabetes mellitus: Secondary | ICD-10-CM | POA: Diagnosis not present

## 2020-05-14 DIAGNOSIS — Z794 Long term (current) use of insulin: Secondary | ICD-10-CM

## 2020-05-14 DIAGNOSIS — L02612 Cutaneous abscess of left foot: Secondary | ICD-10-CM | POA: Diagnosis not present

## 2020-05-14 LAB — COMPREHENSIVE METABOLIC PANEL
ALT: 7 U/L (ref 0–53)
ALT: 10 U/L (ref 0–44)
AST: 7 U/L (ref 0–37)
AST: 8 U/L — ABNORMAL LOW (ref 15–41)
Albumin: 3.2 g/dL — ABNORMAL LOW (ref 3.5–5.2)
Albumin: 3 g/dL — ABNORMAL LOW (ref 3.5–5.0)
Alkaline Phosphatase: 84 U/L (ref 39–117)
Alkaline Phosphatase: 87 U/L (ref 38–126)
Anion gap: 10 (ref 5–15)
BUN: 25 mg/dL — ABNORMAL HIGH (ref 6–23)
BUN: 24 mg/dL — ABNORMAL HIGH (ref 8–23)
CO2: 31 mEq/L (ref 19–32)
CO2: 29 mmol/L (ref 22–32)
Calcium: 9 mg/dL (ref 8.4–10.5)
Calcium: 8.9 mg/dL (ref 8.9–10.3)
Chloride: 97 mEq/L (ref 96–112)
Chloride: 99 mmol/L (ref 98–111)
Creatinine, Ser: 1.4 mg/dL (ref 0.40–1.50)
Creatinine, Ser: 1.36 mg/dL — ABNORMAL HIGH (ref 0.61–1.24)
GFR, Estimated: 55 mL/min — ABNORMAL LOW (ref >60)
GFR: 49.51 mL/min — ABNORMAL LOW (ref >60.00)
Glucose, Bld: 336 mg/dL — ABNORMAL HIGH (ref 70–99)
Glucose, Bld: 425 mg/dL — ABNORMAL HIGH (ref 70–99)
Potassium: 4.2 mEq/L (ref 3.5–5.1)
Potassium: 4.5 mmol/L (ref 3.5–5.1)
Sodium: 135 mEq/L (ref 135–145)
Sodium: 138 mmol/L (ref 135–145)
Total Bilirubin: 0.4 mg/dL (ref 0.2–1.2)
Total Bilirubin: 0.6 mg/dL (ref 0.3–1.2)
Total Protein: 7 g/dL (ref 6.0–8.3)
Total Protein: 7.4 g/dL (ref 6.5–8.1)

## 2020-05-14 LAB — URINALYSIS, ROUTINE W REFLEX MICROSCOPIC
Bacteria, UA: NONE SEEN
Bilirubin Urine: NEGATIVE
Glucose, UA: >=500 mg/dL — AB
Ketones, ur: 5 mg/dL — AB
Leukocytes,Ua: NEGATIVE
Nitrite: NEGATIVE
Protein, ur: 100 mg/dL — AB
Specific Gravity, Urine: 1.033 — ABNORMAL HIGH (ref 1.005–1.030)
pH: 5 (ref 5.0–8.0)

## 2020-05-14 LAB — D-DIMER, QUANTITATIVE: D-Dimer, Quant: 3.42 ug/mL-FEU — ABNORMAL HIGH (ref 0.00–0.50)

## 2020-05-14 LAB — CBG MONITORING, ED: Glucose-Capillary: 216 mg/dL — ABNORMAL HIGH (ref 70–99)

## 2020-05-14 LAB — CBC WITH DIFFERENTIAL/PLATELET
Basophils Absolute: 0 10*3/uL (ref 0.0–0.1)
Basophils Relative: 0.6 % (ref 0.0–3.0)
Eosinophils Absolute: 0 10*3/uL (ref 0.0–0.7)
Eosinophils Relative: 0.8 % (ref 0.0–5.0)
HCT: 23.8 % — CL (ref 39.0–52.0)
Hemoglobin: 7.8 g/dL — CL (ref 13.0–17.0)
Lymphocytes Relative: 11.2 % — ABNORMAL LOW (ref 12.0–46.0)
Lymphs Abs: 0.5 10*3/uL — ABNORMAL LOW (ref 0.7–4.0)
MCHC: 32.8 g/dL (ref 30.0–36.0)
MCV: 92.9 fl (ref 78.0–100.0)
Monocytes Absolute: 0.5 10*3/uL (ref 0.1–1.0)
Monocytes Relative: 10.5 % (ref 3.0–12.0)
Neutro Abs: 3.3 10*3/uL (ref 1.4–7.7)
Neutrophils Relative %: 76.9 % (ref 43.0–77.0)
Platelets: 174 10*3/uL (ref 150.0–400.0)
RBC: 2.56 Mil/uL — ABNORMAL LOW (ref 4.22–5.81)
RDW: 16.6 % — ABNORMAL HIGH (ref 11.5–15.5)
WBC: 4.3 10*3/uL (ref 4.0–10.5)

## 2020-05-14 LAB — FERRITIN: Ferritin: 1120 ng/mL — ABNORMAL HIGH (ref 24–336)

## 2020-05-14 LAB — CBC
HCT: 25.7 % — ABNORMAL LOW (ref 39.0–52.0)
Hemoglobin: 8.1 g/dL — ABNORMAL LOW (ref 13.0–17.0)
MCH: 30.6 pg (ref 26.0–34.0)
MCHC: 31.5 g/dL (ref 30.0–36.0)
MCV: 97 fL (ref 80.0–100.0)
Platelets: 167 10*3/uL (ref 150–400)
RBC: 2.65 MIL/uL — ABNORMAL LOW (ref 4.22–5.81)
RDW: 15.2 % (ref 11.5–15.5)
WBC: 4.7 10*3/uL (ref 4.0–10.5)
nRBC: 0 % (ref 0.0–0.2)

## 2020-05-14 LAB — FOLATE: Folate: 9.6 ng/mL (ref >5.9)

## 2020-05-14 LAB — LACTATE DEHYDROGENASE: LDH: 133 U/L (ref 98–192)

## 2020-05-14 LAB — SARS CORONAVIRUS 2 BY RT PCR (HOSPITAL ORDER, PERFORMED IN ~~LOC~~ HOSPITAL LAB): SARS Coronavirus 2: POSITIVE — AB

## 2020-05-14 LAB — MAGNESIUM: Magnesium: 1.5 mg/dL — ABNORMAL LOW (ref 1.7–2.4)

## 2020-05-14 LAB — IRON AND TIBC
Iron: 36 ug/dL — ABNORMAL LOW (ref 45–182)
Saturation Ratios: 15 % — ABNORMAL LOW (ref 17.9–39.5)
TIBC: 243 ug/dL — ABNORMAL LOW (ref 250–450)
UIBC: 207 ug/dL

## 2020-05-14 LAB — FIBRINOGEN: Fibrinogen: 598 mg/dL — ABNORMAL HIGH (ref 210–475)

## 2020-05-14 LAB — C-REACTIVE PROTEIN: CRP: 6.9 mg/dL — ABNORMAL HIGH (ref <1.0)

## 2020-05-14 LAB — LACTIC ACID, PLASMA: Lactic Acid, Venous: 0.7 mmol/L (ref 0.5–1.9)

## 2020-05-14 LAB — PROCALCITONIN: Procalcitonin: <0.1 ng/mL

## 2020-05-14 MED ORDER — MAGNESIUM SULFATE 2 GM/50ML IV SOLN
2.0000 g | Freq: Once | INTRAVENOUS | Status: AC
  Administered 2020-05-14: 2 g via INTRAVENOUS
  Filled 2020-05-14: qty 50

## 2020-05-14 MED ORDER — SODIUM CHLORIDE 0.9 % IV BOLUS
1000.0000 mL | Freq: Once | INTRAVENOUS | Status: AC
  Administered 2020-05-14: 1000 mL via INTRAVENOUS

## 2020-05-14 MED ORDER — DILTIAZEM HCL 25 MG/5ML IV SOLN
20.0000 mg | Freq: Once | INTRAVENOUS | Status: AC
  Administered 2020-05-14: 20 mg via INTRAVENOUS
  Filled 2020-05-14: qty 5

## 2020-05-14 MED ORDER — SODIUM CHLORIDE 0.9 % IV SOLN
200.0000 mg | Freq: Once | INTRAVENOUS | Status: AC
  Administered 2020-05-14: 200 mg via INTRAVENOUS
  Filled 2020-05-14: qty 200

## 2020-05-14 MED ORDER — ALBUTEROL SULFATE HFA 108 (90 BASE) MCG/ACT IN AERS
1.0000 | INHALATION_SPRAY | RESPIRATORY_TRACT | Status: DC | PRN
  Filled 2020-05-14: qty 6.7

## 2020-05-14 MED ORDER — IOHEXOL 350 MG/ML SOLN
75.0000 mL | Freq: Once | INTRAVENOUS | Status: AC | PRN
  Administered 2020-05-14: 75 mL via INTRAVENOUS

## 2020-05-14 MED ORDER — SODIUM CHLORIDE 0.9 % IV SOLN
100.0000 mg | Freq: Every day | INTRAVENOUS | Status: AC
  Administered 2020-05-15 – 2020-05-18 (×4): 100 mg via INTRAVENOUS
  Filled 2020-05-14 (×4): qty 20

## 2020-05-14 MED ORDER — LINAGLIPTIN 5 MG PO TABS
5.0000 mg | ORAL_TABLET | Freq: Every day | ORAL | Status: DC
  Administered 2020-05-14 – 2020-05-21 (×8): 5 mg via ORAL
  Filled 2020-05-14 (×8): qty 1

## 2020-05-14 MED ORDER — ACETAMINOPHEN 325 MG PO TABS: 650.0000 mg | ORAL_TABLET | Freq: Four times a day (QID) | ORAL | Status: DC | PRN

## 2020-05-14 MED ORDER — PREDNISONE 10 MG PO TABS: ORAL_TABLET | ORAL | 0 refills | Status: DC

## 2020-05-14 MED ORDER — IPRATROPIUM-ALBUTEROL 20-100 MCG/ACT IN AERS
1.0000 | INHALATION_SPRAY | Freq: Four times a day (QID) | RESPIRATORY_TRACT | Status: DC
  Administered 2020-05-14 – 2020-05-17 (×11): 1 via RESPIRATORY_TRACT
  Filled 2020-05-14: qty 4

## 2020-05-14 MED ORDER — ACETAMINOPHEN 650 MG RE SUPP: 650.0000 mg | Freq: Four times a day (QID) | RECTAL | Status: DC | PRN

## 2020-05-14 MED ORDER — ENOXAPARIN SODIUM 40 MG/0.4ML ~~LOC~~ SOLN: 40.0000 mg | SUBCUTANEOUS | Status: DC

## 2020-05-14 MED ORDER — CEFDINIR 300 MG PO CAPS: 600.0000 mg | ORAL_CAPSULE | Freq: Every day | ORAL | 0 refills | Status: DC

## 2020-05-14 MED ORDER — GUAIFENESIN ER 600 MG PO TB12
600.0000 mg | ORAL_TABLET | Freq: Every day | ORAL | Status: DC | PRN
  Administered 2020-05-18 – 2020-05-20 (×4): 600 mg via ORAL
  Filled 2020-05-14 (×4): qty 1

## 2020-05-14 MED ORDER — INSULIN ASPART 100 UNIT/ML IV SOLN
8.0000 [IU] | Freq: Once | INTRAVENOUS | Status: AC
  Administered 2020-05-14: 8 [IU] via INTRAVENOUS
  Filled 2020-05-14: qty 0.08

## 2020-05-14 MED ORDER — INSULIN ASPART 100 UNIT/ML ~~LOC~~ SOLN
0.0000 [IU] | Freq: Every day | SUBCUTANEOUS | Status: DC
  Administered 2020-05-14 – 2020-05-15 (×2): 2 [IU] via SUBCUTANEOUS
  Administered 2020-05-18: 3 [IU] via SUBCUTANEOUS
  Administered 2020-05-20: 5 [IU] via SUBCUTANEOUS
  Filled 2020-05-14: qty 0.05

## 2020-05-14 MED ORDER — DILTIAZEM HCL ER COATED BEADS 240 MG PO CP24
240.0000 mg | ORAL_CAPSULE | Freq: Every day | ORAL | Status: DC
  Administered 2020-05-15 – 2020-05-18 (×4): 240 mg via ORAL
  Filled 2020-05-14 (×4): qty 1

## 2020-05-14 MED ORDER — DONEPEZIL HCL 10 MG PO TABS
10.0000 mg | ORAL_TABLET | Freq: Every day | ORAL | Status: DC
  Administered 2020-05-15 – 2020-05-20 (×6): 10 mg via ORAL
  Filled 2020-05-14 (×6): qty 1

## 2020-05-14 MED ORDER — MEMANTINE HCL 10 MG PO TABS
10.0000 mg | ORAL_TABLET | Freq: Two times a day (BID) | ORAL | Status: DC
  Administered 2020-05-14 – 2020-05-21 (×14): 10 mg via ORAL
  Filled 2020-05-14: qty 1
  Filled 2020-05-14: qty 2
  Filled 2020-05-14 (×12): qty 1

## 2020-05-14 MED ORDER — ATORVASTATIN CALCIUM 20 MG PO TABS
20.0000 mg | ORAL_TABLET | Freq: Every day | ORAL | Status: DC
  Administered 2020-05-14 – 2020-05-20 (×7): 20 mg via ORAL
  Filled 2020-05-14: qty 2
  Filled 2020-05-14 (×6): qty 1

## 2020-05-14 MED ORDER — SIMVASTATIN 20 MG PO TABS: 40.0000 mg | ORAL_TABLET | Freq: Every day | ORAL | Status: DC

## 2020-05-14 MED ORDER — INSULIN ASPART 100 UNIT/ML ~~LOC~~ SOLN
0.0000 [IU] | Freq: Three times a day (TID) | SUBCUTANEOUS | Status: DC
  Administered 2020-05-15: 11 [IU] via SUBCUTANEOUS
  Administered 2020-05-15: 20 [IU] via SUBCUTANEOUS
  Administered 2020-05-15: 3 [IU] via SUBCUTANEOUS
  Administered 2020-05-16: 11 [IU] via SUBCUTANEOUS
  Administered 2020-05-16: 20 [IU] via SUBCUTANEOUS
  Administered 2020-05-16: 4 [IU] via SUBCUTANEOUS
  Administered 2020-05-17: 7 [IU] via SUBCUTANEOUS
  Administered 2020-05-17: 4 [IU] via SUBCUTANEOUS
  Administered 2020-05-17: 11 [IU] via SUBCUTANEOUS
  Administered 2020-05-18: 3 [IU] via SUBCUTANEOUS
  Administered 2020-05-18: 4 [IU] via SUBCUTANEOUS
  Administered 2020-05-18: 7 [IU] via SUBCUTANEOUS
  Administered 2020-05-19 (×2): 4 [IU] via SUBCUTANEOUS
  Administered 2020-05-20: 11 [IU] via SUBCUTANEOUS
  Administered 2020-05-20: 4 [IU] via SUBCUTANEOUS
  Administered 2020-05-21: 7 [IU] via SUBCUTANEOUS
  Filled 2020-05-14: qty 0.2

## 2020-05-14 MED ORDER — MONTELUKAST SODIUM 10 MG PO TABS
10.0000 mg | ORAL_TABLET | Freq: Every day | ORAL | Status: DC
  Administered 2020-05-14 – 2020-05-20 (×7): 10 mg via ORAL
  Filled 2020-05-14 (×7): qty 1

## 2020-05-14 MED ORDER — ENOXAPARIN SODIUM 40 MG/0.4ML ~~LOC~~ SOLN
40.0000 mg | SUBCUTANEOUS | Status: DC
  Administered 2020-05-14 – 2020-05-18 (×5): 40 mg via SUBCUTANEOUS
  Filled 2020-05-14 (×5): qty 0.4

## 2020-05-14 MED ORDER — DEXAMETHASONE 4 MG PO TABS: 6.0000 mg | ORAL_TABLET | Freq: Every day | ORAL | Status: DC

## 2020-05-14 MED ORDER — DEXAMETHASONE 4 MG PO TABS
6.0000 mg | ORAL_TABLET | Freq: Every day | ORAL | Status: DC
  Administered 2020-05-14 – 2020-05-15 (×2): 6 mg via ORAL
  Filled 2020-05-14 (×2): qty 1

## 2020-05-14 MED ORDER — METOPROLOL TARTRATE 5 MG/5ML IV SOLN: 5.0000 mg | INTRAVENOUS | Status: DC | PRN

## 2020-05-14 NOTE — Telephone Encounter (Signed)
Spoke with Mr. [name].  He was inquiring about his chest x-ray results.  Results still pending.  Patient states he really needs so relief.

## 2020-05-14 NOTE — Telephone Encounter (Signed)
Agree with dispo

## 2020-05-14 NOTE — Addendum Note (Signed)
Addended by: [name] on: 05/14/2020 10:23 AM   Modules accepted: Orders

## 2020-05-14 NOTE — Telephone Encounter (Signed)
Can you call [name].  He needs to go to the [facility] .  His hemoglobin (blood count) is down to 7.  With that and being so sick, they should see him and see if he needs a blood transfusion and more for his sick lungs.

## 2020-05-14 NOTE — ED Notes (Signed)
ED TO INPATIENT HANDOFF REPORT  Name/Age/Gender [name] 75 y.o. male  Code Status    Code Status Orders  (From admission, onward)         Start     Ordered   05/14/20 1950  Full code  Continuous        05/14/20 1950        Code Status History    Date Active Date Inactive Code Status Order ID Comments User Context   03/24/2020 1825 03/27/2020 1934 Full Code [id number]  Allie Bossier, MD ED   03/11/2020 2140 03/15/2020 2024 Full Code [id number]  Etta Quill, DO ED   11/22/2019 2244 11/28/2019 2331 Full Code [id number]  Darliss Cheney, MD ED   10/17/2019 1505 10/18/2019 2353 Full Code [id number]  Erick Colace, NP Inpatient   10/12/2019 2101 10/14/2019 1722 Full Code [id number]  Gwynne Edinger, MD Inpatient   04/07/2019 0033 04/08/2019 1824 Full Code [id number]  Orene Desanctis, DO ED   Advance Care Planning Activity      Home/SNF/Other Home  Chief Complaint COVID-19 virus infection [U07.1]  Level of Care/Admitting Diagnosis ED Disposition    ED Disposition Condition [facility]: [place] [[id number]]  Level of Care: Progressive [102]  Admit to Progressive based on following criteria: CARDIOVASCULAR & THORACIC of moderate stability with acute coronary syndrome symptoms/low risk myocardial infarction/hypertensive urgency/arrhythmias/heart failure potentially compromising stability and stable post cardiovascular intervention patients.  May admit patient to [facility] or [facility] if equivalent level of care is available:: No  Covid Evaluation: Confirmed COVID Positive  Diagnosis: COVID-19 virus infection [[id number]]  Admitting Physician: Rhetta Mura [[id number]]  Attending Physician: Rhetta Mura [[id number]]  Estimated length of stay: past midnight tomorrow  Certification:: I certify this patient will need inpatient services for at least 2 midnights       Medical History Past Medical History:  Diagnosis Date  .  Allergic rhinitis due to pollen   . Chronic kidney disease, stage III (moderate) ([place]) 12/19/2012  . Depression   . Diabetes mellitus ([place])   . Diverticulosis   . Erectile dysfunction associated with type 2 diabetes mellitus ([place]) 05/28/2015  . Former very heavy cigarette smoker (more than 40 per day) 10/07/2014  . GERD (gastroesophageal reflux disease)   . Gout   . Hyperlipidemia   . Hypertension   . Hypogonadism male 06/14/2012  . Memory loss   . Osteoarthritis   . Personal history of colonic polyps    1996  . Small cell carcinoma of right lung ([place]) 10/23/2019    Allergies Allergies  Allergen Reactions  . Tetracycline Itching and Rash    IV Location/Drains/Wounds Patient Lines/Drains/Airways Status    Active Line/Drains/Airways    Name Placement date Placement time Site Days   Implanted Port 11/30/19 Right Chest 11/30/19  1026  Chest  166   Negative Pressure Wound Therapy Foot Left 11/23/19  1800  -  173   Incision (Closed) 11/23/19 Foot Left 11/23/19  1808  - 173   Wound / Incision (Open or Dehisced) 04/07/19 Diabetic ulcer Foot Left 04/07/19  0407  Foot  403   Wound / Incision (Open or Dehisced) 03/25/20 Diabetic ulcer Right;Anterior;Medial 03/25/20  1930  -  50   Wound / Incision (Open or Dehisced) 03/25/20 Diabetic ulcer Foot Right;Posterior;Mid 03/25/20  1930  Foot  50          Labs/Imaging Results for orders placed  or performed during the hospital encounter of 05/14/20 (from the past 48 hour(s))  Comprehensive metabolic panel     Status: Abnormal   Collection Time: 05/14/20  2:33 PM  Result Value Ref Range   Sodium 138 135 - 145 mmol/L   Potassium 4.5 3.5 - 5.1 mmol/L   Chloride 99 98 - 111 mmol/L   CO2 29 22 - 32 mmol/L   Glucose, Bld 425 (H) 70 - 99 mg/dL    Comment: Glucose reference range applies only to samples taken after fasting for at least 8 hours.   BUN 24 (H) 8 - 23 mg/dL   Creatinine, Ser 1.36 (H) 0.61 - 1.24 mg/dL   Calcium 8.9 8.9 - 10.3 mg/dL    Total Protein 7.4 6.5 - 8.1 g/dL   Albumin 3.0 (L) 3.5 - 5.0 g/dL   AST 8 (L) 15 - 41 U/L   ALT 10 0 - 44 U/L   Alkaline Phosphatase 87 38 - 126 U/L   Total Bilirubin 0.6 0.3 - 1.2 mg/dL   GFR, Estimated 55 (L) >60 mL/min    Comment: (NOTE) Calculated using the CKD-EPI Creatinine Equation (2021)    Anion gap 10 5 - 15    Comment: Performed at [facility], [name] [address], [place], [place] [address]  CBC     Status: Abnormal   Collection Time: 05/14/20  2:33 PM  Result Value Ref Range   WBC 4.7 4.0 - 10.5 K/uL   RBC 2.65 (L) 4.22 - 5.81 MIL/uL   Hemoglobin 8.1 (L) 13.0 - 17.0 g/dL   HCT 25.7 (L) 39.0 - 52.0 %   MCV 97.0 80.0 - 100.0 fL   MCH 30.6 26.0 - 34.0 pg   MCHC 31.5 30.0 - 36.0 g/dL   RDW 15.2 11.5 - 15.5 %   Platelets 167 150 - 400 K/uL   nRBC 0.0 0.0 - 0.2 %    Comment: Performed at [facility], [place] [address] [place], [place] [address]  Urinalysis, Routine w reflex microscopic Urine, Clean Catch     Status: Abnormal   Collection Time: 05/14/20  5:10 PM  Result Value Ref Range   Color, Urine YELLOW YELLOW   APPearance CLEAR CLEAR   Specific Gravity, Urine 1.033 (H) 1.005 - 1.030   pH 5.0 5.0 - 8.0   Glucose, UA >=500 (A) NEGATIVE mg/dL   Hgb urine dipstick SMALL (A) NEGATIVE   Bilirubin Urine NEGATIVE NEGATIVE   Ketones, ur 5 (A) NEGATIVE mg/dL   Protein, ur 100 (A) NEGATIVE mg/dL   Nitrite NEGATIVE NEGATIVE   Leukocytes,Ua NEGATIVE NEGATIVE   RBC / HPF 0-5 0 - 5 RBC/hpf   WBC, UA 11-20 0 - 5 WBC/hpf   Bacteria, UA NONE SEEN NONE SEEN   Squamous Epithelial / LPF 0-5 0 - 5    Comment: Performed at [facility], [place] [address], [place],  [address]  Procalcitonin     Status: None   Collection Time: 05/14/20  5:10 PM  Result Value Ref Range   Procalcitonin <0.10 ng/mL    Comment:        Interpretation: PCT (Procalcitonin) <= 0.5 ng/mL: Systemic infection (sepsis) is not likely. Local bacterial  infection is possible. (NOTE)       Sepsis PCT Algorithm           Lower Respiratory Tract  Infection PCT Algorithm    ----------------------------     ----------------------------         PCT < 0.25 ng/mL                PCT < 0.10 ng/mL          Strongly encourage             Strongly discourage   discontinuation of antibiotics    initiation of antibiotics    ----------------------------     -----------------------------       PCT 0.25 - 0.50 ng/mL            PCT 0.10 - 0.25 ng/mL               OR       >80% decrease in PCT            Discourage initiation of                                            antibiotics      Encourage discontinuation           of antibiotics    ----------------------------     -----------------------------         PCT >= 0.50 ng/mL              PCT 0.26 - 0.50 ng/mL               AND        <80% decrease in PCT             Encourage initiation of                                             antibiotics       Encourage continuation           of antibiotics    ----------------------------     -----------------------------        PCT >= 0.50 ng/mL                  PCT > 0.50 ng/mL               AND         increase in PCT                  Strongly encourage                                      initiation of antibiotics    Strongly encourage escalation           of antibiotics                                     -----------------------------                                           PCT <= 0.25 ng/mL  OR                                        > 80% decrease in PCT                                      Discontinue / Do not initiate                                             antibiotics  Performed at [place] [address] [place], [place] [address]   SARS Coronavirus 2 by RT PCR (hospital order, performed in [facility] hospital lab)  Nasopharyngeal Nasopharyngeal Swab     Status: Abnormal   Collection Time: 05/14/20  5:10 PM   Specimen: Nasopharyngeal Swab  Result Value Ref Range   SARS Coronavirus 2 POSITIVE (A) NEGATIVE    Comment: RESULT CALLED TO, READ BACK BY AND VERIFIED WITH: BROOKS,B. RN @1856  ON 02.02.2022 BY COHEN,K (NOTE) SARS-CoV-2 target nucleic acids are DETECTED  SARS-CoV-2 RNA is generally detectable in upper respiratory specimens  during the acute phase of infection.  Positive results are indicative  of the presence of the identified virus, but do not rule out bacterial infection or co-infection with other pathogens not detected by the test.  Clinical correlation with patient history and  other diagnostic information is necessary to determine patient infection status.  The expected result is negative.  Fact Sheet for Patients:   StrictlyIdeas.no   Fact Sheet for Healthcare Providers:   BankingDealers.co.za    This test is not yet approved or cleared by the [place] FDA and  has been authorized for detection and/or diagnosis of SARS-CoV-2 by FDA under an Emergency Use Authorization (EUA).  This EUA will remain in effect (meanin g this test can be used) for the duration of  the COVID-19 declaration under Section 564(b)(1) of the Act, 21 U.S.C. section 360-bbb-3(b)(1), unless the authorization is terminated or revoked sooner.  Performed at [facility], [place] [address], [place], [place] [address]   Lactic acid, plasma     Status: None   Collection Time: 05/14/20  5:10 PM  Result Value Ref Range   Lactic Acid, Venous 0.7 0.5 - 1.9 mmol/L    Comment: Performed at [facility], [place] [address], [place], [place] [address]  Lactate dehydrogenase     Status: None   Collection Time: 05/14/20  7:54 PM  Result Value Ref Range   LDH 133 98 - 192 U/L    Comment: Performed at [facility], [place]  [address] [place], [place] [address]  Fibrinogen     Status: Abnormal   Collection Time: 05/14/20  7:54 PM  Result Value Ref Range   Fibrinogen 598 (H) 210 - 475 mg/dL    Comment: Performed at [facility], [place] [address] [place], [place] [address]  Ferritin     Status: Abnormal   Collection Time: 05/14/20  7:54 PM  Result Value Ref Range   Ferritin 1,120 (H) 24 - 336 ng/mL    Comment: Performed at [facility], [place] [address] [place], [place] [address]  D-dimer, quantitative (not  at [facility])     Status: Abnormal   Collection Time: 05/14/20  7:54 PM  Result Value Ref Range   D-Dimer, Quant 3.42 (H) 0.00 - 0.50 ug/mL-FEU    Comment: (NOTE) At the manufacturer cut-off value of 0.5 g/mL FEU, this assay has a negative predictive value of 95-100%.This assay is intended for use in conjunction with a clinical pretest probability (PTP) assessment model to exclude pulmonary embolism (PE) and deep venous thrombosis (DVT) in outpatients suspected of PE or DVT. Results should be correlated with clinical presentation. Performed at [facility], [place] [address], [place], [place] [address]   C-reactive protein     Status: Abnormal   Collection Time: 05/14/20  7:54 PM  Result Value Ref Range   CRP 6.9 (H) <1.0 mg/dL    Comment: Performed at [facility], [place] [address] [place], [place] [address]  Iron and TIBC     Status: Abnormal   Collection Time: 05/14/20  7:54 PM  Result Value Ref Range   Iron 36 (L) 45 - 182 ug/dL   TIBC 243 (L) 250 - 450 ug/dL   Saturation Ratios 15 (L) 17.9 - 39.5 %   UIBC 207 ug/dL    Comment: Performed at [facility], [place] [address] [place], [place] [address]  Magnesium     Status: Abnormal   Collection Time: 05/14/20  7:54 PM  Result Value Ref Range   Magnesium 1.5 (L) 1.7 - 2.4 mg/dL    Comment: Performed at [facility], [place] [address]  [place], [place] [address]  Folate     Status: None   Collection Time: 05/14/20  8:02 PM  Result Value Ref Range   Folate 9.6 >5.9 ng/mL    Comment: Performed at [facility], [place] [address], [place], [place] [address]  CBG monitoring, ED     Status: Abnormal   Collection Time: 05/14/20  9:36 PM  Result Value Ref Range   Glucose-Capillary 216 (H) 70 - 99 mg/dL    Comment: Glucose reference range applies only to samples taken after fasting for at least 8 hours.   *Note: Due to a large number of results and/or encounters for the requested time period, some results have not been displayed. A complete set of results can be found in Results Review.   DG Chest 2 View  Result Date: 05/14/2020 CLINICAL DATA:  Worsening shortness of breath for 4 days with productive cough. Active treatment for lung cancer. EXAM: CHEST - 2 VIEW COMPARISON:  Chest radiographs yesterday.  Most recent CT 03/24/2020 FINDINGS: Right chest port remains in place. Moderate to large partially loculated right pleural effusion is not significantly changed from yesterday. Opacities throughout the right hemithorax, confluent in the perihilar region, also unchanged. Cavitary component on prior CT is not well seen by radiograph. Stable volume loss in the right hemithorax. Small left pleural effusion is similar. Background chronic bronchial thickening. Minor atelectasis at the left lung base. No pneumothorax. Stable osseous structures. IMPRESSION: 1. No significant change from yesterday. 2. Unchanged partially loculated moderate to large right pleural effusion. Diffuse right lung opacities, confluent in the perihilar region, patient with known lung cancer. Cavitary component on prior CT is not well demonstrated. 3. Small left pleural effusion is similar. 4. Chronic bronchial thickening. Electronically Signed   By: Keith Rake M.D.   On: 05/14/2020 14:59   DG Chest 2 View  Result Date: 05/14/2020 CLINICAL DATA:  Cough,  shortness  of breath EXAM: CHEST - 2 VIEW COMPARISON:  03/26/2020 FINDINGS: Right Port-A-Cath remains in place, unchanged. Right pleural effusion and airspace disease throughout the right lung, similar to prior study. Minimal left base opacity, likely atelectasis. Small left pleural effusion. Heart is normal size. IMPRESSION: Moderate to large right pleural effusion with diffuse right lung airspace disease, unchanged. Small left effusion with left base atelectasis. Electronically Signed   By: Rolm Baptise M.D.   On: 05/14/2020 11:06   CT Angio Chest PE W and/or Wo Contrast  Result Date: 05/14/2020 CLINICAL DATA:  PE suspected, high prob Progressive shortness of breath for 4 days. Productive cough. Active treatment for lung cancer. EXAM: CT ANGIOGRAPHY CHEST WITH CONTRAST TECHNIQUE: Multidetector CT imaging of the chest was performed using the standard protocol during bolus administration of intravenous contrast. Multiplanar CT image reconstructions and MIPs were obtained to evaluate the vascular anatomy. CONTRAST:  33mL OMNIPAQUE IOHEXOL 350 MG/ML SOLN COMPARISON:  Radiograph earlier today. Most recent chest CTA 03/24/2020 FINDINGS: Cardiovascular: There are no filling defects within the pulmonary arteries to suggest pulmonary embolus. The right upper lobe pulmonary arteries are attenuated. Aortic atherosclerosis without acute aortic finding. Right chest port with tip in the right atrium. Normal heart size. No pericardial effusion or thickening. Mediastinum/Nodes: 19 mm right lower paratracheal node, series 4, image 58, previously 22 mm. Multiple additional small mediastinal lymph nodes are not enlarged by size criteria. No definite new adenopathy. 12 mm right thyroid nodule. Not clinically significant; no follow-up imaging recommended (ref: J Am Coll Radiol. 2015 Feb;12(2): 143-50).No esophageal wall thickening. Lungs/Pleura: Moderately large partially loculated right pleural effusion. This has increased in size  from prior exam with increased atelectasis in the right lower lobe. Right upper lobe cavitary lesion with air-fluid level, slightly increased, currently 5.9 cm, previously 4.6 cm by my retrospective measurement. Exact measurement difficult due to the ill-defined adjacent soft tissue density. Diffuse right perihilar consolidation and soft tissue density, progressed. Ill-defined spiculated densities in the aerated right upper lobe. There is narrowing of the right mainstem bronchus, with complete occlusion of the right middle and lower lobe bronchi, difficult to delineate if this is due to secretions or external compression. Right middle lobe appears completely atelectatic. Moderate left pleural effusion is similar. 2 small left upper lobe pulmonary nodules peripherally measuring 5 and 6 mm, both on series 6, image 51, not seen on prior. Irregular subpleural density with ill-defined margins in the anterior left upper lobe, series 6, image 77, also new. Breathing motion artifact limits detailed assessment. There is no pneumothorax. Upper Abdomen: Upper abdominal atherosclerosis. Cholecystectomy. No acute upper abdominal findings. Musculoskeletal: No evidence of focal bone lesion. No acute osseous abnormalities are seen. Degenerative change throughout the spine. Review of the MIP images confirms the above findings. IMPRESSION: 1. No pulmonary embolus. 2. Moderately large partially loculated right pleural effusion which has increased in size from prior exam. Progressive right perihilar consolidation and soft tissue density with narrowing of the right mainstem bronchus and complete occlusion of the right middle and lower lobe bronchi. It is unclear if bronchial occlusion is due to retained secretions/mucus or external compression. Right middle lobe is completely atelectatic. 3. Cavitary lesion in the right upper lobe with equivocal increase in size from prior exam. 4. Two small left upper lobe pulmonary nodules measuring 5  and 6 mm, not seen on prior exam, nonspecific. No ill-defined subpleural density in the anterior left upper lobe. Recommend attention at follow-up. 5. Decreased size of right lower paratracheal  node. Aortic Atherosclerosis (ICD10-I70.0). Electronically Signed   By: Keith Rake M.D.   On: 05/14/2020 18:27    Pending Labs Unresulted Labs (From admission, onward)          Start     Ordered   05/15/20 0500  TSH  Tomorrow morning,   R        05/14/20 1950   05/15/20 0500  C-reactive protein  Tomorrow morning,   R        05/14/20 1951   05/15/20 0500  Ferritin  Tomorrow morning,   R        05/14/20 1951   05/15/20 0500  Fibrinogen  Tomorrow morning,   R        05/14/20 1951   05/15/20 0500  Lactate dehydrogenase  Tomorrow morning,   R        05/14/20 1951   05/15/20 0500  Magnesium  Tomorrow morning,   R        05/14/20 1951   05/15/20 0500  Phosphorus  Tomorrow morning,   R        05/14/20 1951   05/15/20 0500  Comprehensive metabolic panel  Tomorrow morning,   R       Question:  Specimen collection method  Answer:  Lab=Lab collect   05/14/20 1954   05/15/20 0500  CBC with Differential/Platelet  Tomorrow morning,   R       Question:  Specimen collection method  Answer:  Lab=Lab collect   05/14/20 1954   05/15/20 0500  Protime-INR  Tomorrow morning,   R        05/14/20 1958   05/15/20 0500  Hemoglobin A1c  Tomorrow morning,   R        05/14/20 2001   05/14/20 2002  Methylmalonic acid, serum  Add-on,   AD        05/14/20 2002   05/14/20 1616  Culture, blood (routine x 2)  BLOOD CULTURE X 2,   STAT      05/14/20 1617          Vitals/Pain Today's Vitals   05/14/20 1930 05/14/20 2000 05/14/20 2138 05/14/20 2200  BP: 140/79 (!) 143/82 133/87 122/81  Pulse: (!) 52 78 78 (!) 107  Resp: 16 (!) 31 18 (!) 23  Temp:   97.6 F (36.4 C)   TempSrc:   Oral   SpO2: 100% 94% 95% 99%    Isolation Precautions Airborne and Contact precautions  Medications Medications  diltiazem  (CARDIZEM CD) 24 hr capsule 240 mg (has no administration in time range)  donepezil (ARICEPT) tablet 10 mg (has no administration in time range)  memantine (NAMENDA) tablet 10 mg (10 mg Oral Given 05/14/20 2155)  guaiFENesin (MUCINEX) 12 hr tablet 600 mg (has no administration in time range)  montelukast (SINGULAIR) tablet 10 mg (10 mg Oral Given 05/14/20 2156)  acetaminophen (TYLENOL) tablet 650 mg (has no administration in time range)    Or  acetaminophen (TYLENOL) suppository 650 mg (has no administration in time range)  linagliptin (TRADJENTA) tablet 5 mg (5 mg Oral Given 05/14/20 2156)  albuterol (VENTOLIN HFA) 108 (90 Base) MCG/ACT inhaler 1-2 puff (has no administration in time range)  Ipratropium-Albuterol (COMBIVENT) respimat 1 puff (1 puff Inhalation Given 05/14/20 2159)  atorvastatin (LIPITOR) tablet 20 mg (20 mg Oral Given 05/14/20 2155)  metoprolol tartrate (LOPRESSOR) injection 5 mg (has no administration in time range)  insulin aspart (novoLOG) injection 0-20 Units (has no administration in time  range)  insulin aspart (novoLOG) injection 0-5 Units (2 Units Subcutaneous Given 05/14/20 2155)  dexamethasone (DECADRON) tablet 6 mg (6 mg Oral Given 05/14/20 2155)  remdesivir 200 mg in sodium chloride 0.9% 250 mL IVPB (0 mg Intravenous Stopped 05/14/20 2113)    Followed by  remdesivir 100 mg in sodium chloride 0.9 % 100 mL IVPB (has no administration in time range)  enoxaparin (LOVENOX) injection 40 mg (40 mg Subcutaneous Given 05/14/20 2156)  magnesium sulfate IVPB 2 g 50 mL (2 g Intravenous New Bag/Given 05/14/20 2155)  insulin aspart (novoLOG) injection 8 Units (8 Units Intravenous Given 05/14/20 1705)  sodium chloride 0.9 % bolus 1,000 mL (0 mLs Intravenous Stopped 05/14/20 1828)  iohexol (OMNIPAQUE) 350 MG/ML injection 75 mL (75 mLs Intravenous Contrast Given 05/14/20 1743)  diltiazem (CARDIZEM) injection 20 mg (20 mg Intravenous Given 05/14/20 1828)    Mobility walks

## 2020-05-14 NOTE — Telephone Encounter (Signed)
Mr. Spradley notified as instructed by telephone.  Patient states understanding.

## 2020-05-14 NOTE — ED Notes (Signed)
Patient has a blue and gold in the main lab

## 2020-05-14 NOTE — ED Notes (Signed)
Pt placed on 2 L [place], spo2 90's or above.

## 2020-05-14 NOTE — ED Notes (Signed)
Patient transported to CT 

## 2020-05-14 NOTE — Telephone Encounter (Signed)
Mr. Antilla has been instructed to go to [facility] ER per Dr. Lorelei Pont.

## 2020-05-14 NOTE — Telephone Encounter (Signed)
[name] is very fragile and has lung cancer.  I sent him in some Cefdinir and low dose of some steroids.  We will let him know when other tests come back.

## 2020-05-14 NOTE — ED Triage Notes (Signed)
Pt arrived via walk in, c/o increasing SOB x4 days, productive cough. States was seen yesterday, blood work and xray done. Was called today with results, told to go to ED due to hemoglobin 7.8.   Currently undergoing tx for lung CA, has not been able to do recent radiation due to sx.

## 2020-05-14 NOTE — Telephone Encounter (Signed)
Mr. Beilke notified that Dr. Lorelei Pont has sent him in an antibiotic and steroids to go ahead and get started.  I will call him with his x-ray results once they are back.

## 2020-05-14 NOTE — ED Notes (Signed)
Pts HR increased to 140s and pt stated he felt more shob and he was very hot. EKG captured and MD [place] made aware.

## 2020-05-14 NOTE — H&P (Signed)
History and Physical    PLEASE NOTE THAT [employer]AGON DICT[employer] SOFTWARE WAS USED IN THE CONSTRUCTION OF THIS NOTE.   [name] DOB: [birth year] DOA: 05/14/2020  PCP: [name], MD Patient coming from: home   I have personally briefly reviewed patient's old medical records in [place]  Chief Complaint: Shortness of breath  HPI: [name] is a 75 y.o. 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   The patient reports 4 days of progressive shortness of breath associated with new onset nonproductive cough.  He also notes generalized weakness over that timeframe in the absence of any associated acute focal weakness, acute focal numbness, paresthesias, dysarthria, dysphagia, facial droop, vertigo, or acute change in vision.  Shortness of breath has not been associated with any orthopnea, PND, or new onset peripheral edema.  Denies any associated chest pain, diaphoresis palpitations, nausea, vomiting, presyncope, or syncope.  Also denies any recent wheezing, mopped assist, new lower extremity erythema, or calf tenderness.  Denies any recent trauma.  No recent melena or hematochezia.  Denies any associated subjective fever, chills, rigors, or generalized myalgias.  No recent headache, neck stiffness, rhinitis, rhinorrhea, sore throat, abdominal pain, diarrhea, or rash.  No known recent COVID-19 exposures.  Denies any recent  dysuria, gross hematuria, or change in urinary urgency/frequency.  As received 2 doses of the [place] COVID-19 vaccine in March and April, respectively, in addition to receiving the [place] posterior in October 2021.  The patient denies any known baseline supplemental oxygen requirements.    ED Course:  Vital signs in the ED were notable for the following: Temperature max 97.5, heart rate 72-1 40, with the latter associated with A. fib/a flutter; blood pressure 121/75 - 158/73respiratory rate 17-23; oxygen saturation initially noted to be 88% on room air, which is improved to 97 to 99% on 3 L nasal cannula.  Of note, heart rate has been 70s to 80s following single dose of IV diltiazem given in the ED today, as further described below.  Labs were notable for the following: CMP was notable for the following: Sodium 138, potassium 4.5, bicarbonate 29, creatinine 1.36 relative to most recent prior creatinine value of 1.40 on 05/13/2020, glucose 425, ALT 10.  CBC was notable for white cell count of 4700, hemoglobin 8.1 with normocytic normochromic findings as well as a nonelevated RDW, which is compared to most recent prior hemoglobin value of 7.8 on 05/13/2020.  Procalcitonin less than 0.10.  Nasopharyngeal COVID-19 PCR the was performed in the ED today, and found to be positive.  EKG showed atrial fibrillation with RVR ventricular rate 125, without evidence of interval T wave or ST changes, including no evidence of ST elevation.  2 view chest x-ray, by way of comparison to chest x-ray performed yesterday, showed no significant interval changes, demonstrating unchanged partially loculated moderate/large right pleural effusion with diffuse reticular opacities in the perihilar region, also unchanged, and a small left pleural effusion, in the absence of any evidence of pneumothorax.  CTA of the chest was performed today, it was  compared to CTA chest performed on 03/24/2020, and found to demonstrate the following: No  evidence of acute pulmonary embolism, moderate sized known partially loculated right pleural effusion, with slight increase in size relative to that on prior CTA chest.  Progressive right perihilar consolidation with occlusion of the right middle and lower lobe bronchi relative to compressive atelectasis observed in the right lower lobe on prior CTA, and also showed no interval change in right upper lobe cavitary lesion.   While in the ED, the following were administered: Diltiazem 20 mg IV x1, NovoLog 8 units IV x1, and a 1 L normal saline bolus.     Review of Systems: As per HPI otherwise 10 point review of systems negative.   Past Medical History:  Diagnosis Date  . Allergic rhinitis due to pollen   . Chronic kidney disease, stage III (moderate) ([place]) 12/19/2012  . Depression   . Diabetes mellitus ([place])   . Diverticulosis   . Erectile dysfunction associated with type 2 diabetes mellitus ([place]) 05/28/2015  . Former very heavy cigarette smoker (more than 40 per day) 10/07/2014  . GERD (gastroesophageal reflux disease)   . Gout   . Hyperlipidemia   . Hypertension   . Hypogonadism male 06/14/2012  . Memory loss   . Osteoarthritis   . Personal history of colonic polyps    1996  . Small cell carcinoma of right lung ([place]) 10/23/2019    Past Surgical History:  Procedure Laterality Date  . BRONCHIAL NEEDLE ASPIRATION BIOPSY  10/17/2019   Procedure: BRONCHIAL NEEDLE ASPIRATION BIOPSIES;  Surgeon: Juanito Doom, MD;  Location: [facility];  Service: Cardiopulmonary;;  . CARDIAC CATHETERIZATION  11/2011   [facility]  . CHOLECYSTECTOMY  1980  . ENDOBRONCHIAL ULTRASOUND Bilateral 10/17/2019   Procedure: ENDOBRONCHIAL ULTRASOUND;  Surgeon: Juanito Doom, MD;  Location: [facility];  Service: Cardiopulmonary;  Laterality: Bilateral;  . ESOPHAGOGASTRODUODENOSCOPY (EGD) WITH PROPOFOL N/A 09/27/2017   Procedure: ESOPHAGOGASTRODUODENOSCOPY (EGD) WITH PROPOFOL;  Surgeon: Lin Landsman, MD;   Location: [place];  Service: Gastroenterology;  Laterality: N/A;  . EYE SURGERY Right 07/29/2016   cataract - Dr. Manuella Ghazi  . EYE SURGERY Left 08/23/2106   cataract - Dr. Manuella Ghazi  . HEMOSTASIS CONTROL  10/17/2019   Procedure: HEMOSTASIS CONTROL;  Surgeon: Juanito Doom, MD;  Location: [facility];  Service: Cardiopulmonary;;  . IR IMAGING GUIDED PORT INSERTION  11/30/2019  . IRRIGATION AND DEBRIDEMENT ABSCESS Left 11/23/2019   Procedure: IRRIGATION AND DEBRIDEMENT ABSCESS;  Surgeon: Melina Schools, MD;  Location: [facility];  Service: Orthopedics;  Laterality: Left;  . TOTAL HIP ARTHROPLASTY  2009   Dr. Gladstone Lighter  . TOTAL HIP ARTHROPLASTY      Social History:  reports that he quit smoking about 20 years ago. His smoking use included cigarettes. He has a 105.00 pack-year smoking history. He has never used smokeless tobacco. He reports that he does not drink alcohol and does not use drugs.   Allergies  Allergen Reactions  . Tetracycline Itching and Rash    Family History  Problem Relation Age of Onset  . Diabetes Mother   . Alzheimer's disease Mother   . Diabetes Father   . Lung cancer Father        Age 76  . Stroke Father   . Heart attack Father   . Lung cancer Sister        lung  . Heart disease Maternal Grandfather   . COPD Sister   . Heart attack Sister   .  Heart disease Sister      Prior to Admission medications   Medication Sig Start Date End Date Taking? Authorizing Provider  acetaminophen (TYLENOL) 650 MG CR tablet Take 650 mg by mouth every 8 (eight) hours as needed for pain.   Yes [provider]  albuterol (VENTOLIN HFA) 108 (90 Base) MCG/ACT inhaler Inhale 1-2 puffs into the lungs every 6 (six) hours as needed for wheezing or shortness of breath. 02/15/20  Yes Tanner, Lyndon Code., [name]  Ascorbic Acid (VITAMIN C) 1000 MG tablet Take 1,000 mg by mouth daily.   Yes [provider]  cefdinir (OMNICEF) 300 MG capsule Take 2 capsules (600 mg total) by mouth  daily for 10 days. 05/14/20 05/24/20 Yes Copland, Frederico Hamman, MD  cholecalciferol (VITAMIN D) 1000 UNITS tablet Take 2,000 Units by mouth daily.   Yes [provider]  diltiazem (CARDIZEM CD) 240 MG 24 hr capsule Take 1 capsule (240 mg total) by mouth daily. 03/28/20  Yes Dessa Phi, DO  donepezil (ARICEPT) 10 MG tablet TAKE 1 TABLET BY MOUTH AT BEDTIME 08/24/19  Yes Copland, Frederico Hamman, MD  fluticasone (FLONASE) 50 MCG/ACT nasal spray Place 1 spray into both nostrils daily as needed for allergies.   Yes [provider]  guaiFENesin (MUCINEX) 600 MG 12 hr tablet Take 600 mg by mouth daily as needed for cough.   Yes [provider]  insulin regular human CONCENTRATED (HUMULIN R U-500 KWIKPEN) 500 UNIT/ML kwikpen Inject 40 Units into the skin with breakfast, with lunch, and with evening meal.   Yes [provider]  memantine (NAMENDA) 10 MG tablet TAKE 1 TABLET BY MOUTH TWICE A DAY 09/20/19  Yes Copland, Frederico Hamman, MD  montelukast (SINGULAIR) 10 MG tablet Take 1 tablet (10 mg total) by mouth at bedtime. 09/20/19  Yes Bedsole, Amy E, MD  pantoprazole (PROTONIX) 20 MG tablet Take 1 tablet (20 mg total) by mouth daily. 01/21/20  Yes Heilingoetter, Cassandra L, [name]  simvastatin (ZOCOR) 40 MG tablet TAKE 1 TABLET BY MOUTH AT BEDTIME 04/30/19  Yes Copland, Frederico Hamman, MD  venlafaxine XR (EFFEXOR-XR) 75 MG 24 hr capsule TAKE 1 CAPSULE BY MOUTH EVERY MORNING AND 2 CAPSULES AT BEDTIME Patient taking differently: Take 75-150 mg by mouth in the morning and at bedtime. TAKE 1 CAPSULE BY MOUTH EVERY MORNING AND 2 CAPSULES AT BEDTIME 11/26/19  Yes Copland, Spencer, MD  Hyprom-Naphaz-Polysorb-Zn Sulf (CLEAR EYES COMPLETE OP) Place 1 drop into both eyes daily as needed (dry eyes).    [provider]  Insulin Pen Needle (PEN NEEDLES) 31G X 5 MM MISC 1 each by Does not apply route 3 (three) times daily. To inject insulin E11.42 09/20/19   Philemon Kingdom, MD  predniSONE (DELTASONE) 10 MG  tablet 2 tabs po for 3 days, then 1 tablet po for 5 days 05/14/20   Copland, Frederico Hamman, MD  prochlorperazine (COMPAZINE) 10 MG tablet Take 1 tablet (10 mg total) by mouth every 6 (six) hours as needed. Patient not taking: Reported on 05/14/2020 10/23/19   Heilingoetter, Cassandra L, [name]     Objective    Physical Exam: Vitals:   05/14/20 1800 05/14/20 1830 05/14/20 1840 05/14/20 1850  BP: (!) 165/101 (!) 148/81 121/75 137/75  Pulse:  (!) 35 72   Resp:  $Remo'20 20 19  'ZPXnf$ Temp:      TempSrc:      SpO2:  97% 94%     General: appears to be stated age; alert, ; slight increase in work of breathing noted  Skin: warm, dry, no rash Head:  AT/[place] Mouth:  Oral mucosa membranes appear moist, normal dentition Neck: supple; trachea midline Heart: Irregular; did not appreciate any M/R/G Lungs: Diminished bibasilar breath sounds, but otherwise CTAB, did not appreciate any wheezes, rales, or rhonchi Abdomen: + BS; soft, ND, NT Vascular: 2+ pedal pulses b/l; 2+ radial pulses b/l Extremities: no peripheral edema, no muscle wasting Neuro: strength and sensation intact in upper and lower extremities b/l   Labs on Admission: I have personally reviewed following labs and imaging studies  CBC: Recent Labs  Lab 05/13/20 1319 05/14/20 1433  WBC 4.3 4.7  NEUTROABS 3.3  --   HGB 7.8 Repeated and verified X2.* 8.1*  HCT 23.8 Repeated and verified X2.* 25.7*  MCV 92.9 97.0  PLT 174.0 026   Basic Metabolic Panel: Recent Labs  Lab 05/13/20 1319 05/14/20 1433  NA 135 138  K 4.2 4.5  CL 97 99  CO2 31 29  GLUCOSE 336* 425*  BUN 25* 24*  CREATININE 1.40 1.36*  CALCIUM 9.0 8.9   GFR: CrCl cannot be calculated (Unknown ideal weight.). Liver Function Tests: Recent Labs  Lab 05/13/20 1319 05/14/20 1433  AST 7 8*  ALT 7 10  ALKPHOS 84 87  BILITOT 0.4 0.6  PROT 7.0 7.4  ALBUMIN 3.2* 3.0*   No results for input(s): LIPASE, AMYLASE in the last 168 hours. No results for input(s): AMMONIA in the last  168 hours. Coagulation Profile: No results for input(s): INR, PROTIME in the last 168 hours. Cardiac Enzymes: No results for input(s): CKTOTAL, CKMB, CKMBINDEX, TROPONINI in the last 168 hours. BNP (last 3 results) No results for input(s): PROBNP in the last 8760 hours. HbA1C: No results for input(s): HGBA1C in the last 72 hours. CBG: No results for input(s): GLUCAP in the last 168 hours. Lipid Profile: No results for input(s): CHOL, HDL, LDLCALC, TRIG, CHOLHDL, LDLDIRECT in the last 72 hours. Thyroid Function Tests: No results for input(s): TSH, T4TOTAL, FREET4, T3FREE, THYROIDAB in the last 72 hours. Anemia Panel: No results for input(s): VITAMINB12, FOLATE, FERRITIN, TIBC, IRON, RETICCTPCT in the last 72 hours. Urine analysis:    Component Value Date/Time   COLORURINE YELLOW 03/11/2020 1556   APPEARANCEUR HAZY (A) 03/11/2020 1556   LABSPEC 1.012 03/11/2020 1556   PHURINE 5.0 03/11/2020 1556   GLUCOSEU >=500 (A) 03/11/2020 1556   HGBUR MODERATE (A) 03/11/2020 1556   BILIRUBINUR NEGATIVE 03/11/2020 1556   BILIRUBINUR 1+ 09/10/2014 1615   KETONESUR NEGATIVE 03/11/2020 1556   PROTEINUR 100 (A) 03/11/2020 1556   UROBILINOGEN 0.2 12/05/2014 2146   NITRITE NEGATIVE 03/11/2020 1556   LEUKOCYTESUR NEGATIVE 03/11/2020 1556    Radiological Exams on Admission: DG Chest 2 View  Result Date: 05/14/2020 CLINICAL DATA:  Worsening shortness of breath for 4 days with productive cough. Active treatment for lung cancer. EXAM: CHEST - 2 VIEW COMPARISON:  Chest radiographs yesterday.  Most recent CT 03/24/2020 FINDINGS: Right chest port remains in place. Moderate to large partially loculated right pleural effusion is not significantly changed from yesterday. Opacities throughout the right hemithorax, confluent in the perihilar region, also unchanged. Cavitary component on prior CT is not well seen by radiograph. Stable volume loss in the right hemithorax. Small left pleural effusion is similar.  Background chronic bronchial thickening. Minor atelectasis at the left lung base. No pneumothorax. Stable osseous structures. IMPRESSION: 1. No significant change from yesterday. 2. Unchanged partially loculated moderate to large right pleural effusion. Diffuse right lung opacities, confluent in the  perihilar region, patient with known lung cancer. Cavitary component on prior CT is not well demonstrated. 3. Small left pleural effusion is similar. 4. Chronic bronchial thickening. Electronically Signed   By: Keith Rake M.D.   On: 05/14/2020 14:59   DG Chest 2 View  Result Date: 05/14/2020 CLINICAL DATA:  Cough, shortness of breath EXAM: CHEST - 2 VIEW COMPARISON:  03/26/2020 FINDINGS: Right Port-A-Cath remains in place, unchanged. Right pleural effusion and airspace disease throughout the right lung, similar to prior study. Minimal left base opacity, likely atelectasis. Small left pleural effusion. Heart is normal size. IMPRESSION: Moderate to large right pleural effusion with diffuse right lung airspace disease, unchanged. Small left effusion with left base atelectasis. Electronically Signed   By: Rolm Baptise M.D.   On: 05/14/2020 11:06   CT Angio Chest PE W and/or Wo Contrast  Result Date: 05/14/2020 CLINICAL DATA:  PE suspected, high prob Progressive shortness of breath for 4 days. Productive cough. Active treatment for lung cancer. EXAM: CT ANGIOGRAPHY CHEST WITH CONTRAST TECHNIQUE: Multidetector CT imaging of the chest was performed using the standard protocol during bolus administration of intravenous contrast. Multiplanar CT image reconstructions and MIPs were obtained to evaluate the vascular anatomy. CONTRAST:  66mL OMNIPAQUE IOHEXOL 350 MG/ML SOLN COMPARISON:  Radiograph earlier today. Most recent chest CTA 03/24/2020 FINDINGS: Cardiovascular: There are no filling defects within the pulmonary arteries to suggest pulmonary embolus. The right upper lobe pulmonary arteries are attenuated. Aortic  atherosclerosis without acute aortic finding. Right chest port with tip in the right atrium. Normal heart size. No pericardial effusion or thickening. Mediastinum/Nodes: 19 mm right lower paratracheal node, series 4, image 58, previously 22 mm. Multiple additional small mediastinal lymph nodes are not enlarged by size criteria. No definite new adenopathy. 12 mm right thyroid nodule. Not clinically significant; no follow-up imaging recommended (ref: J Am Coll Radiol. 2015 Feb;12(2): 143-50).No esophageal wall thickening. Lungs/Pleura: Moderately large partially loculated right pleural effusion. This has increased in size from prior exam with increased atelectasis in the right lower lobe. Right upper lobe cavitary lesion with air-fluid level, slightly increased, currently 5.9 cm, previously 4.6 cm by my retrospective measurement. Exact measurement difficult due to the ill-defined adjacent soft tissue density. Diffuse right perihilar consolidation and soft tissue density, progressed. Ill-defined spiculated densities in the aerated right upper lobe. There is narrowing of the right mainstem bronchus, with complete occlusion of the right middle and lower lobe bronchi, difficult to delineate if this is due to secretions or external compression. Right middle lobe appears completely atelectatic. Moderate left pleural effusion is similar. 2 small left upper lobe pulmonary nodules peripherally measuring 5 and 6 mm, both on series 6, image 51, not seen on prior. Irregular subpleural density with ill-defined margins in the anterior left upper lobe, series 6, image 77, also new. Breathing motion artifact limits detailed assessment. There is no pneumothorax. Upper Abdomen: Upper abdominal atherosclerosis. Cholecystectomy. No acute upper abdominal findings. Musculoskeletal: No evidence of focal bone lesion. No acute osseous abnormalities are seen. Degenerative change throughout the spine. Review of the MIP images confirms the above  findings. IMPRESSION: 1. No pulmonary embolus. 2. Moderately large partially loculated right pleural effusion which has increased in size from prior exam. Progressive right perihilar consolidation and soft tissue density with narrowing of the right mainstem bronchus and complete occlusion of the right middle and lower lobe bronchi. It is unclear if bronchial occlusion is due to retained secretions/mucus or external compression. Right middle lobe is completely atelectatic.  3. Cavitary lesion in the right upper lobe with equivocal increase in size from prior exam. 4. Two small left upper lobe pulmonary nodules measuring 5 and 6 mm, not seen on prior exam, nonspecific. No ill-defined subpleural density in the anterior left upper lobe. Recommend attention at follow-up. 5. Decreased size of right lower paratracheal node. Aortic Atherosclerosis (ICD10-I70.0). Electronically Signed   By: Keith Rake M.D.   On: 05/14/2020 18:27     EKG: Independently reviewed, with result as described above.    Assessment/Plan   [name] is a 75 y.o. 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     Principal Problem:   COVID-19 virus infection Active Problems:   Atrial fibrillation with RVR (HCC)   SOB (shortness of breath)   Generalized weakness   Pleural effusion, right   DM2 (diabetes mellitus, type 2) (HCC)   Hyperlipidemia    #) Severe COVID-19 infection:  diagnosis on the basis of: 4 days of progressive shortness of breath associated with new onset cough, with nasopharyngeal COVID-19 PCR performed in the ED today found to be positive relative to prior COVID-19 PCR on 03/24/2020, which was found to be negative.  Additionally, in the context of no known baseline supplemental O2 requirements, the patient is requiring 3 L nasal cannula in order to maintain O2 sats greater than or equal to 94%. In setting of this acute hypoxia, criteria are met from patient's COVID-19 infection to be considered severe in nature. Consequently, there is a Grade 2c rec for dexamethasone, which is further supported by treatment guidance recommendations from [place]'s Covid Treatment Guidelines.   Of note, in the setting of the patient's age greater than 75 as well as multiple comorbidities include diabetes and chronic kidney disease, this patient meets criteria to be considered high risk for a more complicated clinical course of COVID-19 infection, including increased probability for progression of the severity associated with this infection. Therefore, in the setting of symptomatic COVID-19 infection requiring hospitalization for further evaluation and management thereof in this patient with the aforementioned high risk criteria who is felt to be early in the course of their infection given onset of respiratory symptoms starting less than 7 days ago, indications are met for initiation of remdesivir per treatment guidance recommendations from [place]'s Covid Treatment Guidelines. Of note, ALT found to be less than 220. Therefore, there is no contraindication for initiation of remdesivir on the basis of transaminitis.   Will closely monitor ensuing degree of hypoxia as well as associated trend in supplemental oxygen requirements. Does not appear to me indications for initiation of Tocilizumab at this time, as further described below.Denies any known or suspected COVID-19 exposures.  Vaccine status: Received 2 doses of Pfizer's COVID-19 vaccine followed by the booster, as further detailed above. Of note, in the setting of a history of diabetes, will initiate daily linagliptin as DPP-4 inhibitors have been shown to reduce mortality in patients with DM2 and a COVID-19.  We will add on general telemetry markers, and closely trend these values.  Of note,  procalcitonin was found to be nonelevated, which, in the context of the pro inflammatory state associated with COVID-19, right-sided gram-negative predictive value against the possibility of concomitant bacterial pneumonia.    Plan: Airborne and contact precautions. Monitor continuous pulse oximetry and monitor on telemetry. prn supplemental O2 to maintain O2 sats greater than or equal to 94%. Proning protocol initiated. PRN albuterol inhaler. Scheduled combivent inhaler q6H. PRN acetaminophen for fever. Start dexamethasone and remdesivir, as above. Check and trend inflammatory markers (fibrinogen, d dimer or fibrin derivatives, crp, ferritin, LDH). Check serum magnesium and phosphorus levels. Check CMP and CBC in the morning. Flutter valve and incentive spirometry. If rapid progression of supplemental oxygen demand, development of need for high flow O2, or worsening hypoxemia with CRP > 10, would consider initiation of Tocilizumab at that point. In setting of DM2, will start linagliptin 5 mg PO Qdaily for associated mortality benefit, as above.       #) Acute hypoxic respiratory distress: in the context of no known baseline supplemental oxygen requirements, presenting O2 sat noted to be in the high 80s on room air, requiring 3 L nasal cannula to maintain oxygen saturations greater than or equal to 94%, thereby meeting criteria for acute hypoxic respiratory distress as opposed to acute hypoxic respiratory failure at this time. Appears to be on the basis of COVID-19 infection, as above, although contribution from slight interval increase in  size of right pleural effusion as well as evidence of compressive atelectasis may provide some additional contribution. ACS is felt to be less likely at this time in the absence of any recent chest pain and in the context of presenting EKG showing no evidence of acute ischemic process. No clinical or radiographic evidence of suggest acutely decompensated heart failure at this time.  Of note, CTA chest performed today showed no evidence of acute pulmonary embolism.  If rapid progression of supplemental oxygen demand or if development of need for high flow O2, would consider initiation of Tocilizumab at that point.  Plan: further evaluation and management of presenting COVID-19 infection, as above, including monitoring of continuous pulse oximetry with prn supplemental O2 to maintain O2 sats greater than or equal to 94%. monitor on telemetry. Trending of inflammatory markers, as above. Check CMP and CBC in the morning. Check serum Mg and Phos levels. Flutter valve and incentive spirometry. PRN albuterol inhaler. Scheduled combivent inhaler q6H. initiate dexamethasone and remdesivir, as above.  I have ordered a repeat chest x-ray for the morning to trend size of right pleural effusion.        #) Atrial fibrillation with RVR: In the context of a documented history of paroxysmal atrial fibrillation, the patient was found initially to be in RVR upon presenting to the emergency department today, with associated ventricular rates into the 140s.  Heart rates have improved into the 70s to 80s following single dose of IV diltiazem, as above while maintaining normotensive blood pressures.  Suspect that exacerbation of RVR is on the basis of physiologic stress stemming from presenting severe COVID-19 infection as well as associated acute hypoxic respiratory distress, as above.  Also additional underlying infectious contributions, including checking urinalysis.  AV nodal blocking regimen as an outpatient consists of  daily diltiazem.  It does not appear that the patient is on chronic anticoagulant patient in spite of CHA2DS2-VASc score of 3, which would otherwise meet criteria for chronic anticoagulation for thromboembolic prophylaxis.  Will attempt to further Rationale for the absence of chronic anticoagulation.  Of note,  presenting EKG showed atrial fibrillation with RVR, with ventricular rates in the 120s, without evidence of acute ischemic changes.   Plan: Monitor strict I's and O's and daily weights. Monitor on telemetry. Check serum magnesium level with prn supplementation to maintain levels of greater than or equal to 2.0. Repeat BMP in the AM.  Repeat CBC in the AM.  Check urinalysis.  Resume home diltiazem.  I have placed an order for as needed IV Lopressor for heart rates greater and 130.  Should heart rate in the setting of atrial fibrillation consistently exceed the range of 120s to 130s in spite of as needed IV doses of Lopressor, will consider initiation of diltiazem drip.  Attempt further chart review and effort to determine rationale behind the absence of chronic anticoagulation for this patient, as further described above.  Monitor on continuous pulse oximetry.  Work-up and management of presenting suspected respiratory acute hypoxic respiratory distress the setting of severe Covid infection, as above.  Check TSH.      #) Generalized weakness: The patient reports 3 to 4 days of generalized weakness in the setting of his new onset respiratory symptoms and in the absence of any concomitant acute focal neurologic deficits.  Suspect that this generalized weakness is on the basis of physiologic stress stemming from his presenting severe COVID-19 infection, as above, particular in the context of likely little reserve in the setting of small cell lung cancer.  We will also check urinalysis to evaluate for any concomitant sources of infection could be contributing to presenting generalized weakness.  Plan:  Work-up and management of severe COVID-19 infection, as above.  Check TSH.  I placed a physical therapy consult to occur in the morning.  Check urinalysis.  Repeat CMP and CBC in the morning.     #) Type 2 diabetes mellitus: With most recent hemoglobin A1c noted to be 8.7% when checked in November 2021.  He is on regular insulin 40 units 3 times daily with meals, in the absence of oral hypoglycemic agents.  Presenting blood sugar per presenting CMP noted to be 425 in the absence of any anion gap metabolic acidosis.  Suspect hyperglycemia contributions from physiologic stress stemming from presenting severe COVID-19 infection, as above.  The patient has received NovoLog 8 units IV x1 in the ED this evening.  Plan: Repeat Accu-Chek in 1 hour to evaluate trend in blood sugar following IV dose of NovoLog administered in the ED.  Accu-Cheks before every meal and at bedtime with associated moderate dose sliding scale insulin along with low-dose sliding scale insulin for at bedtime coverage.  Repeat hemoglobin A1c level in the morning.      #) Hyperlipidemia: On simvastatin as an outpatient.  Plan: Continue home statin.    #) Stage IIIa chronic kidney disease: Associated with baseline creatinine range 1.2-1.5, presenting labs reflect serum creatinine within this range.  Plan: Monitor strict I's and O's and daily weights.  Repeat BMP in the morning.      #) Chronic anemia: With suspected contribution from anemia of chronic disease in the setting of normocytic findings as well as a nonelevated RDW, thereby reducing the likelihood of acute blood loss anemia.  Is associated with baseline hemoglobin range of 7-9, with presenting CBC reflecting hemoglobin of 8.1 within this range. Will add-on anemia laboratory evaluation to evaluate for any potential reversible/treatable contribution to the patient's chronic anemia, as further described above.  Plan: Add on ferritin, TIBC, total iron, MMA, folic  acid.  Repeat  CBC in the morning.     DVT prophylaxis: Lovenox 40 mg subcu daily, SCDs Code Status: Full code Family Communication: none Disposition Plan: Per Rounding Team Consults called: none  Admission status: Inpatient; PCU  COVID-19 Vaccination status: Has received 2 doses COVID-19 vaccine from [employer], as well as booster in October 2021.    Of note, this patient was added by me to the following Admit List/Treatment Team:  wladmits     PLEASE NOTE THAT DRAGON DICTATION SOFTWARE WAS USED IN THE CONSTRUCTION OF THIS NOTE.   [facility] Pager [phone] From [place]  Otherwise, please contact night-coverage  [email or web address] Password [email or web address]  05/14/2020, 7:14 PM

## 2020-05-14 NOTE — ED Provider Notes (Signed)
[name] Provider Note   CSN: [id number] Arrival date & time: 05/14/20  1358     History Chief Complaint  Patient presents with  . Abnormal Lab    [name] is a 75 y.o. male.  Presents you have active lung cancer with metastasis, diabetes and hypertension, presents with shortness of breath generalized weakness.  He states his daughter cough for the past 4 days and worsening shortness of breath.  His primary care doctor sent some labs on him his hemoglobin was 7.8.  However his baseline hemoglobin has been around 8.1 for the past several weeks.  He denies any fevers or vomiting or diarrhea.        Past Medical History:  Diagnosis Date  . Allergic rhinitis due to pollen   . Chronic kidney disease, stage III (moderate) ([place]) 12/19/2012  . Depression   . Diabetes mellitus ([place])   . Diverticulosis   . Erectile dysfunction associated with type 2 diabetes mellitus ([place]) 05/28/2015  . Former very heavy cigarette smoker (more than 40 per day) 10/07/2014  . GERD (gastroesophageal reflux disease)   . Gout   . Hyperlipidemia   . Hypertension   . Hypogonadism male 06/14/2012  . Memory loss   . Osteoarthritis   . Personal history of colonic polyps    1996  . Small cell carcinoma of right lung ([place]) 10/23/2019    Patient Active Problem List   Diagnosis Date Noted  . Prolonged QT interval 03/25/2020  . Atrial fibrillation with RVR ([place]) 03/24/2020  . Secondary hypercoagulable state ([place]) 03/19/2020  . Typical atrial flutter ([place]) 03/11/2020  . Antineoplastic chemotherapy induced pancytopenia ([place]) 03/11/2020  . Severe protein-calorie malnutrition ([place]) 02/12/2020  . Chemotherapy induced neutropenia ([place])   . Severe sepsis ([place]) 11/22/2019  . Neutropenic fever ([place]) 11/22/2019  . Non-small cell carcinoma of right lung, stage 3 ([place]) 10/23/2019  . Goals of care, counseling/discussion 10/23/2019  . Encounter for antineoplastic chemotherapy  10/23/2019  . Small cell lung cancer ([place]) 10/12/2019  . Dropfoot 03/15/2019  . Mild cognitive impairment 05/08/2018  . Esophageal dysphagia   . Diabetic foot ulcer ([place]) 06/10/2017  . Uncontrolled type 2 diabetes mellitus with peripheral neuropathy ([place]) 05/11/2016  . Erectile dysfunction associated with type 2 diabetes mellitus ([place]) 05/28/2015  . Former very heavy cigarette smoker (more than 40 per day) 10/07/2014  . Chronic kidney disease, stage III (moderate) ([place]) 12/19/2012  . Hypogonadism male 06/14/2012  . OSA (obstructive sleep apnea) 03/09/2011  . Diabetic neuropathy ([place]) 12/29/2010  . Family history of MI (myocardial infarction) 09/16/2010  . HIP REPLACEMENT, RIGHT, HX OF 02/05/2010  . Hypertriglyceridemia 06/13/2008  . GOUT 06/13/2008  . Major depressive disorder, recurrent episode, in partial remission ([place]) 06/13/2008  . Essential hypertension 06/13/2008  . GERD 06/13/2008  . OSTEOARTHRITIS 06/13/2008    Past Surgical History:  Procedure Laterality Date  . BRONCHIAL NEEDLE ASPIRATION BIOPSY  10/17/2019   Procedure: BRONCHIAL NEEDLE ASPIRATION BIOPSIES;  Surgeon: Juanito Doom, MD;  Location: [facility];  Service: Cardiopulmonary;;  . CARDIAC CATHETERIZATION  11/2011   [facility]  . CHOLECYSTECTOMY  1980  . ENDOBRONCHIAL ULTRASOUND Bilateral 10/17/2019   Procedure: ENDOBRONCHIAL ULTRASOUND;  Surgeon: Juanito Doom, MD;  Location: [facility];  Service: Cardiopulmonary;  Laterality: Bilateral;  . ESOPHAGOGASTRODUODENOSCOPY (EGD) WITH PROPOFOL N/A 09/27/2017   Procedure: ESOPHAGOGASTRODUODENOSCOPY (EGD) WITH PROPOFOL;  Surgeon: Lin Landsman, MD;  Location: [facility];  Service: Gastroenterology;  Laterality: N/A;  . EYE SURGERY  Right 07/29/2016   catar[facility] - Dr. Manuella Ghazi  . EYE SURGERY Left 08/23/2106   cataract - Dr. Manuella Ghazi  . HEMOSTASIS CONTROL  10/17/2019   Procedure: HEMOSTASIS CONTROL;  Surgeon: Juanito Doom, MD;  Location: [facility];  Service:  Cardiopulmonary;;  . IR IMAGING GUIDED PORT INSERTION  11/30/2019  . IRRIGATION AND DEBRIDEMENT ABSCESS Left 11/23/2019   Procedure: IRRIGATION AND DEBRIDEMENT ABSCESS;  Surgeon: Melina Schools, MD;  Location: [facility];  Service: Orthopedics;  Laterality: Left;  . TOTAL HIP ARTHROPLASTY  2009   Dr. Gladstone Lighter  . TOTAL HIP ARTHROPLASTY         Family History  Problem Relation Age of Onset  . Diabetes Mother   . Alzheimer's disease Mother   . Diabetes Father   . Lung cancer Father        Age 64  . Stroke Father   . Heart attack Father   . Lung cancer Sister        lung  . Heart disease Maternal Grandfather   . COPD Sister   . Heart attack Sister   . Heart disease Sister     Social History   Tobacco Use  . Smoking status: Former Smoker    Packs/day: 3.00    Years: 35.00    Pack years: 105.00    Types: Cigarettes    Quit date: 04/12/2000    Years since quitting: 20.1  . Smokeless tobacco: Never Used  Vaping Use  . Vaping Use: Never used  Substance Use Topics  . Alcohol use: No  . Drug use: No    Home Medications Prior to Admission medications   Medication Sig Start Date End Date Taking? Authorizing Provider  acetaminophen (TYLENOL) 650 MG CR tablet Take 650 mg by mouth every 8 (eight) hours as needed for pain.   Yes [provider]  albuterol (VENTOLIN HFA) 108 (90 Base) MCG/ACT inhaler Inhale 1-2 puffs into the lungs every 6 (six) hours as needed for wheezing or shortness of breath. 02/15/20  Yes Tanner, Lyndon Code., [name]  Ascorbic Acid (VITAMIN C) 1000 MG tablet Take 1,000 mg by mouth daily.   Yes [provider]  cefdinir (OMNICEF) 300 MG capsule Take 2 capsules (600 mg total) by mouth daily for 10 days. 05/14/20 05/24/20 Yes Copland, Frederico Hamman, MD  cholecalciferol (VITAMIN D) 1000 UNITS tablet Take 2,000 Units by mouth daily.   Yes [provider]  diltiazem (CARDIZEM CD) 240 MG 24 hr capsule Take 1 capsule (240 mg total) by mouth daily. 03/28/20  Yes  Dessa Phi, DO  donepezil (ARICEPT) 10 MG tablet TAKE 1 TABLET BY MOUTH AT BEDTIME 08/24/19  Yes Copland, Frederico Hamman, MD  fluticasone (FLONASE) 50 MCG/ACT nasal spray Place 1 spray into both nostrils daily as needed for allergies.   Yes [provider]  guaiFENesin (MUCINEX) 600 MG 12 hr tablet Take 600 mg by mouth daily as needed for cough.   Yes [provider]  insulin regular human CONCENTRATED (HUMULIN R U-500 KWIKPEN) 500 UNIT/ML kwikpen Inject 40 Units into the skin with breakfast, with lunch, and with evening meal.   Yes [provider]  memantine (NAMENDA) 10 MG tablet TAKE 1 TABLET BY MOUTH TWICE A DAY 09/20/19  Yes Copland, Frederico Hamman, MD  montelukast (SINGULAIR) 10 MG tablet Take 1 tablet (10 mg total) by mouth at bedtime. 09/20/19  Yes Bedsole, Amy E, MD  pantoprazole (PROTONIX) 20 MG tablet Take 1 tablet (20 mg total) by mouth daily. 01/21/20  Yes  Heilingoetter, Cassandra L, [name]  simvastatin (ZOCOR) 40 MG tablet TAKE 1 TABLET BY MOUTH AT BEDTIME 04/30/19  Yes Copland, Frederico Hamman, MD  venlafaxine XR (EFFEXOR-XR) 75 MG 24 hr capsule TAKE 1 CAPSULE BY MOUTH EVERY MORNING AND 2 CAPSULES AT BEDTIME Patient taking differently: Take 75-150 mg by mouth in the morning and at bedtime. TAKE 1 CAPSULE BY MOUTH EVERY MORNING AND 2 CAPSULES AT BEDTIME 11/26/19  Yes Copland, Spencer, MD  Hyprom-Naphaz-Polysorb-Zn Sulf (CLEAR EYES COMPLETE OP) Place 1 drop into both eyes daily as needed (dry eyes).    [provider]  Insulin Pen Needle (PEN NEEDLES) 31G X 5 MM MISC 1 each by Does not apply route 3 (three) times daily. To inject insulin E11.42 09/20/19   Philemon Kingdom, MD  predniSONE (DELTASONE) 10 MG tablet 2 tabs po for 3 days, then 1 tablet po for 5 days 05/14/20   Copland, Frederico Hamman, MD  prochlorperazine (COMPAZINE) 10 MG tablet Take 1 tablet (10 mg total) by mouth every 6 (six) hours as needed. Patient not taking: Reported on 05/14/2020 10/23/19   Heilingoetter, Cassandra L,  [name]    Allergies    Tetracycline  Review of Systems   Review of Systems  Constitutional: Negative for fever.  HENT: Negative for ear pain and sore throat.   Eyes: Negative for pain.  Respiratory: Positive for cough and shortness of breath.   Cardiovascular: Negative for chest pain.  Gastrointestinal: Negative for abdominal pain.  Genitourinary: Negative for flank pain.  Musculoskeletal: Negative for back pain.  Skin: Negative for color change and rash.  Neurological: Negative for syncope.  All other systems reviewed and are negative.   Physical Exam Updated Vital Signs BP (!) 165/101   Pulse (!) 114   Temp (!) 97.5 F (36.4 C) (Oral)   Resp 17   SpO2 99%   Physical Exam Constitutional:      General: He is not in acute distress.    Appearance: He is well-developed.  HENT:     Head: Normocephalic.     Nose: Nose normal.  Eyes:     Extraocular Movements: Extraocular movements intact.  Cardiovascular:     Rate and Rhythm: Tachycardia present. Rhythm irregular.  Pulmonary:     Effort: Pulmonary effort is normal.  Skin:    Coloration: Skin is not jaundiced.  Neurological:     Mental Status: He is alert. Mental status is at baseline.     ED Results / Procedures / Treatments   Labs (all labs ordered are listed, but only abnormal results are displayed) Labs Reviewed  COMPREHENSIVE METABOLIC PANEL - Abnormal; Notable for the following components:      Result Value   Glucose, Bld 425 (*)    BUN 24 (*)    Creatinine, Ser 1.36 (*)    Albumin 3.0 (*)    AST 8 (*)    GFR, Estimated 55 (*)    All other components within normal limits  CBC - Abnormal; Notable for the following components:   RBC 2.65 (*)    Hemoglobin 8.1 (*)    HCT 25.7 (*)    All other components within normal limits  CULTURE, BLOOD (ROUTINE X 2)  CULTURE, BLOOD (ROUTINE X 2)  SARS CORONAVIRUS 2 BY RT PCR (HOSPITAL ORDER, [facility])  LACTIC ACID, PLASMA  URINALYSIS,  ROUTINE W REFLEX MICROSCOPIC  PROCALCITONIN    EKG None  Radiology DG Chest 2 View  Result Date: 05/14/2020 CLINICAL DATA:  Worsening  shortness of breath for 4 days with productive cough. Active treatment for lung cancer. EXAM: CHEST - 2 VIEW COMPARISON:  Chest radiographs yesterday.  Most recent CT 03/24/2020 FINDINGS: Right chest port remains in place. Moderate to large partially loculated right pleural effusion is not significantly changed from yesterday. Opacities throughout the right hemithorax, confluent in the perihilar region, also unchanged. Cavitary component on prior CT is not well seen by radiograph. Stable volume loss in the right hemithorax. Small left pleural effusion is similar. Background chronic bronchial thickening. Minor atelectasis at the left lung base. No pneumothorax. Stable osseous structures. IMPRESSION: 1. No significant change from yesterday. 2. Unchanged partially loculated moderate to large right pleural effusion. Diffuse right lung opacities, confluent in the perihilar region, patient with known lung cancer. Cavitary component on prior CT is not well demonstrated. 3. Small left pleural effusion is similar. 4. Chronic bronchial thickening. Electronically Signed   By: Keith Rake M.D.   On: 05/14/2020 14:59   DG Chest 2 View  Result Date: 05/14/2020 CLINICAL DATA:  Cough, shortness of breath EXAM: CHEST - 2 VIEW COMPARISON:  03/26/2020 FINDINGS: Right Port-A-Cath remains in place, unchanged. Right pleural effusion and airspace disease throughout the right lung, similar to prior study. Minimal left base opacity, likely atelectasis. Small left pleural effusion. Heart is normal size. IMPRESSION: Moderate to large right pleural effusion with diffuse right lung airspace disease, unchanged. Small left effusion with left base atelectasis. Electronically Signed   By: Rolm Baptise M.D.   On: 05/14/2020 11:06   CT Angio Chest PE W and/or Wo Contrast  Result Date:  05/14/2020 CLINICAL DATA:  PE suspected, high prob Progressive shortness of breath for 4 days. Productive cough. Active treatment for lung cancer. EXAM: CT ANGIOGRAPHY CHEST WITH CONTRAST TECHNIQUE: Multidetector CT imaging of the chest was performed using the standard protocol during bolus administration of intravenous contrast. Multiplanar CT image reconstructions and MIPs were obtained to evaluate the vascular anatomy. CONTRAST:  57mL OMNIPAQUE IOHEXOL 350 MG/ML SOLN COMPARISON:  Radiograph earlier today. Most recent chest CTA 03/24/2020 FINDINGS: Cardiovascular: There are no filling defects within the pulmonary arteries to suggest pulmonary embolus. The right upper lobe pulmonary arteries are attenuated. Aortic atherosclerosis without acute aortic finding. Right chest port with tip in the right atrium. Normal heart size. No pericardial effusion or thickening. Mediastinum/Nodes: 19 mm right lower paratracheal node, series 4, image 58, previously 22 mm. Multiple additional small mediastinal lymph nodes are not enlarged by size criteria. No definite new adenopathy. 12 mm right thyroid nodule. Not clinically significant; no follow-up imaging recommended (ref: J Am Coll Radiol. 2015 Feb;12(2): 143-50).No esophageal wall thickening. Lungs/Pleura: Moderately large partially loculated right pleural effusion. This has increased in size from prior exam with increased atelectasis in the right lower lobe. Right upper lobe cavitary lesion with air-fluid level, slightly increased, currently 5.9 cm, previously 4.6 cm by my retrospective measurement. Exact measurement difficult due to the ill-defined adjacent soft tissue density. Diffuse right perihilar consolidation and soft tissue density, progressed. Ill-defined spiculated densities in the aerated right upper lobe. There is narrowing of the right mainstem bronchus, with complete occlusion of the right middle and lower lobe bronchi, difficult to delineate if this is due to  secretions or external compression. Right middle lobe appears completely atelectatic. Moderate left pleural effusion is similar. 2 small left upper lobe pulmonary nodules peripherally measuring 5 and 6 mm, both on series 6, image 51, not seen on prior. Irregular subpleural density with ill-defined margins in  the anterior left upper lobe, series 6, image 77, also new. Breathing motion artifact limits detailed assessment. There is no pneumothorax. Upper Abdomen: Upper abdominal atherosclerosis. Cholecystectomy. No acute upper abdominal findings. Musculoskeletal: No evidence of focal bone lesion. No acute osseous abnormalities are seen. Degenerative change throughout the spine. Review of the MIP images confirms the above findings. IMPRESSION: 1. No pulmonary embolus. 2. Moderately large partially loculated right pleural effusion which has increased in size from prior exam. Progressive right perihilar consolidation and soft tissue density with narrowing of the right mainstem bronchus and complete occlusion of the right middle and lower lobe bronchi. It is unclear if bronchial occlusion is due to retained secretions/mucus or external compression. Right middle lobe is completely atelectatic. 3. Cavitary lesion in the right upper lobe with equivocal increase in size from prior exam. 4. Two small left upper lobe pulmonary nodules measuring 5 and 6 mm, not seen on prior exam, nonspecific. No ill-defined subpleural density in the anterior left upper lobe. Recommend attention at follow-up. 5. Decreased size of right lower paratracheal node. Aortic Atherosclerosis (ICD10-I70.0). Electronically Signed   By: Keith Rake M.D.   On: 05/14/2020 18:27    Procedures .Critical Care Performed by: Luna Fuse, MD Authorized by: Luna Fuse, MD   Critical care provider statement:    Critical care time (minutes):  40   Critical care time was exclusive of:  Separately billable procedures and treating other patients and  teaching time   Critical care was necessary to treat or prevent imminent or life-threatening deterioration of the following conditions:  Cardiac failure and respiratory failure     Medications Ordered in ED Medications  insulin aspart (novoLOG) injection 8 Units (8 Units Intravenous Given 05/14/20 1705)  sodium chloride 0.9 % bolus 1,000 mL (0 mLs Intravenous Stopped 05/14/20 1828)  iohexol (OMNIPAQUE) 350 MG/ML injection 75 mL (75 mLs Intravenous Contrast Given 05/14/20 1743)  diltiazem (CARDIZEM) injection 20 mg (20 mg Intravenous Given 05/14/20 1828)    ED Course  I have reviewed the triage vital signs and the nursing notes.  Pertinent labs & imaging results that were available during my care of the patient were reviewed by me and considered in my medical decision making (see chart for details).    MDM Rules/Calculators/A&P                          Labs shows normal chemistry mild renal sufficiency.  Hemoglobin is 8.1 which has been his hemoglobin for the past several weeks and unchanged.  Lactic acid level is normal.  Procalcitonin sent and pending blood culture sent and pending.  Patient is tachycardic and hypoxic to about 88% on room air which is new for him.  X-ray shows right-sided pleural effusion.  CT angio pursued with no evidence of pulmonary embolism.  During his ER stay, patient went into a flutter rhythm with a rate of 120 to 140 bpm.  Given IV diltiazem with improvement of heart rate.  Blood sugar also elevated into the 400s, given IV insulin.  Will be brought to the hospitalist team.   Final Clinical Impression(s) / ED Diagnoses Final diagnoses:  Malignant neoplasm of lung, unspecified laterality, unspecified part of lung ([place])  Pleural effusion  Atrial flutter, unspecified type [facility])    Rx / DC Orders ED Discharge Orders    None       Luna Fuse, MD 05/14/20 [phone]

## 2020-05-14 NOTE — Telephone Encounter (Signed)
[facility] called critical lab results, HGB - 7.8, HCT - 23.8, results given to Dr Lorelei Pont.

## 2020-05-14 NOTE — Telephone Encounter (Signed)
Pt called in wanted to ask if Butch Penny could call him

## 2020-05-15 ENCOUNTER — Inpatient Hospital Stay (HOSPITAL_COMMUNITY): Payer: Medicare Other

## 2020-05-15 ENCOUNTER — Ambulatory Visit: Payer: Medicare Other

## 2020-05-15 DIAGNOSIS — U071 COVID-19: Secondary | ICD-10-CM | POA: Diagnosis not present

## 2020-05-15 DIAGNOSIS — I4891 Unspecified atrial fibrillation: Secondary | ICD-10-CM | POA: Diagnosis not present

## 2020-05-15 DIAGNOSIS — R531 Weakness: Secondary | ICD-10-CM | POA: Diagnosis not present

## 2020-05-15 DIAGNOSIS — E119 Type 2 diabetes mellitus without complications: Secondary | ICD-10-CM | POA: Diagnosis not present

## 2020-05-15 LAB — TSH: TSH: 1.227 u[IU]/mL (ref 0.350–4.500)

## 2020-05-15 LAB — GLUCOSE, CAPILLARY
Glucose-Capillary: 285 mg/dL — ABNORMAL HIGH (ref 70–99)
Glucose-Capillary: 354 mg/dL — ABNORMAL HIGH (ref 70–99)
Glucose-Capillary: 142 mg/dL — ABNORMAL HIGH (ref 70–99)
Glucose-Capillary: 221 mg/dL — ABNORMAL HIGH (ref 70–99)

## 2020-05-15 LAB — COMPREHENSIVE METABOLIC PANEL
ALT: 11 U/L (ref 0–44)
AST: 8 U/L — ABNORMAL LOW (ref 15–41)
Albumin: 2.8 g/dL — ABNORMAL LOW (ref 3.5–5.0)
Alkaline Phosphatase: 86 U/L (ref 38–126)
Anion gap: 13 (ref 5–15)
BUN: 21 mg/dL (ref 8–23)
CO2: 25 mmol/L (ref 22–32)
Calcium: 8.7 mg/dL — ABNORMAL LOW (ref 8.9–10.3)
Chloride: 98 mmol/L (ref 98–111)
Creatinine, Ser: 1.22 mg/dL (ref 0.61–1.24)
GFR, Estimated: >60 mL/min (ref >60)
Glucose, Bld: 304 mg/dL — ABNORMAL HIGH (ref 70–99)
Potassium: 4 mmol/L (ref 3.5–5.1)
Sodium: 136 mmol/L (ref 135–145)
Total Bilirubin: 0.9 mg/dL (ref 0.3–1.2)
Total Protein: 6.7 g/dL (ref 6.5–8.1)

## 2020-05-15 LAB — CBC WITH DIFFERENTIAL/PLATELET
Abs Immature Granulocytes: 0.02 10*3/uL (ref 0.00–0.07)
Basophils Absolute: 0 10*3/uL (ref 0.0–0.1)
Basophils Relative: 0 %
Eosinophils Absolute: 0 10*3/uL (ref 0.0–0.5)
Eosinophils Relative: 0 %
HCT: 24 % — ABNORMAL LOW (ref 39.0–52.0)
Hemoglobin: 7.3 g/dL — ABNORMAL LOW (ref 13.0–17.0)
Immature Granulocytes: 1 %
Lymphocytes Relative: 4 %
Lymphs Abs: 0.2 10*3/uL — ABNORMAL LOW (ref 0.7–4.0)
MCH: 30.2 pg (ref 26.0–34.0)
MCHC: 30.4 g/dL (ref 30.0–36.0)
MCV: 99.2 fL (ref 80.0–100.0)
Monocytes Absolute: 0.1 10*3/uL (ref 0.1–1.0)
Monocytes Relative: 2 %
Neutro Abs: 3.7 10*3/uL (ref 1.7–7.7)
Neutrophils Relative %: 93 %
Platelets: 127 10*3/uL — ABNORMAL LOW (ref 150–400)
RBC: 2.42 MIL/uL — ABNORMAL LOW (ref 4.22–5.81)
RDW: 15.2 % (ref 11.5–15.5)
WBC: 3.9 10*3/uL — ABNORMAL LOW (ref 4.0–10.5)
nRBC: 0 % (ref 0.0–0.2)

## 2020-05-15 LAB — FERRITIN: Ferritin: 1057 ng/mL — ABNORMAL HIGH (ref 24–336)

## 2020-05-15 LAB — PROTIME-INR
INR: 1.3 — ABNORMAL HIGH (ref 0.8–1.2)
Prothrombin Time: 15.2 seconds (ref 11.4–15.2)

## 2020-05-15 LAB — MAGNESIUM: Magnesium: 1.9 mg/dL (ref 1.7–2.4)

## 2020-05-15 LAB — C-REACTIVE PROTEIN: CRP: 6.1 mg/dL — ABNORMAL HIGH (ref <1.0)

## 2020-05-15 LAB — NOVEL CORONAVIRUS, NAA: SARS-CoV-2, NAA: NOT DETECTED

## 2020-05-15 LAB — PHOSPHORUS: Phosphorus: 3.9 mg/dL (ref 2.5–4.6)

## 2020-05-15 LAB — HEMOGLOBIN A1C
Hgb A1c MFr Bld: 8.8 % — ABNORMAL HIGH (ref 4.8–5.6)
Mean Plasma Glucose: 205.86 mg/dL

## 2020-05-15 LAB — LACTATE DEHYDROGENASE: LDH: 108 U/L (ref 98–192)

## 2020-05-15 LAB — FIBRINOGEN: Fibrinogen: 625 mg/dL — ABNORMAL HIGH (ref 210–475)

## 2020-05-15 LAB — SARS-COV-2, NAA 2 DAY TAT

## 2020-05-15 MED ORDER — METHYLPREDNISOLONE SODIUM SUCC 125 MG IJ SOLR
40.0000 mg | Freq: Two times a day (BID) | INTRAMUSCULAR | Status: DC
  Administered 2020-05-15 – 2020-05-21 (×13): 40 mg via INTRAVENOUS
  Filled 2020-05-15 (×13): qty 2

## 2020-05-15 MED ORDER — INSULIN ASPART 100 UNIT/ML ~~LOC~~ SOLN
5.0000 [IU] | Freq: Three times a day (TID) | SUBCUTANEOUS | Status: DC
  Administered 2020-05-15 – 2020-05-21 (×17): 5 [IU] via SUBCUTANEOUS

## 2020-05-15 MED ORDER — SODIUM CHLORIDE 0.9% FLUSH
10.0000 mL | INTRAVENOUS | Status: DC | PRN
Start: 2020-05-15 — End: 2020-05-21

## 2020-05-15 MED ORDER — INSULIN GLARGINE 100 UNIT/ML ~~LOC~~ SOLN
20.0000 [IU] | Freq: Every day | SUBCUTANEOUS | Status: DC
  Administered 2020-05-15 – 2020-05-21 (×7): 20 [IU] via SUBCUTANEOUS
  Filled 2020-05-15 (×7): qty 0.2

## 2020-05-15 MED ORDER — ORAL CARE MOUTH RINSE
15.0000 mL | Freq: Two times a day (BID) | OROMUCOSAL | Status: DC
  Administered 2020-05-15 – 2020-05-21 (×14): 15 mL via OROMUCOSAL

## 2020-05-15 MED ORDER — METOPROLOL TARTRATE 5 MG/5ML IV SOLN
5.0000 mg | INTRAVENOUS | Status: DC | PRN
  Administered 2020-05-15 – 2020-05-20 (×15): 5 mg via INTRAVENOUS
  Filled 2020-05-15 (×17): qty 5

## 2020-05-15 MED ORDER — CHLORHEXIDINE GLUCONATE CLOTH 2 % EX PADS
6.0000 | MEDICATED_PAD | Freq: Every day | CUTANEOUS | Status: DC
  Administered 2020-05-15 – 2020-05-21 (×7): 6 via TOPICAL

## 2020-05-15 NOTE — TOC Progression Note (Addendum)
Transition of Care [facility]) - Progression Note    Patient Details  Name: [name] [name]: [id number] Date of Birth: 04/10/1946  Transition of Care [facility]) CM/SW Contact  Purcell Mouton, RN Phone Number: 05/15/2020, 2:20 PM  Clinical Narrative:    Pt from home with spouse, spoke with pt concerning discharge needs. TOC will continue to follow.      Expected Discharge Plan: [place] Barriers to Discharge: No Barriers Identified  Expected Discharge Plan and Services Expected Discharge Plan: [place] arrangements for the past 2 months: Single Family Home                                       Social Determinants of Health (SDOH) Interventions    Readmission Risk Interventions No flowsheet data found.

## 2020-05-15 NOTE — Progress Notes (Signed)
Inpatient Diabetes Program Recommendations  AACE/ADA: New Consensus Statement on Inpatient Glycemic Control (2015)  Target Ranges:  Prepandial:   less than 140 mg/dL      Peak postprandial:   less than 180 mg/dL (1-2 hours)      Critically ill patients:  140 - 180 mg/dL   Lab Results  Component Value Date   GLUCAP 354 (H) 05/15/2020   HGBA1C 8.8 (H) 05/15/2020    Review of Glycemic Control  Diabetes history: DM2 Outpatient Diabetes medications: U-500 20 units TID with meals Current orders for Inpatient glycemic control: Lantus 20 units QD, Novolog 0-20 units TID with meals and 0-5 HS + 5 units TID for meal coverage.  HgbA1C - 8.8% - improved from 10.2% Endo - Dr. Cruzita Lederer May benefit from restarting reduced amount of U-500 insulin if pt is eating.  Inpatient Diabetes Program Recommendations:     D/C Lantus D/C Novolog 5 units TID Continue Novolog 0-20 units TID with meals and 0-5 HS Add U-500 10 units TID with meals  Closely follow glucose trends. Adjust U-500 insulin daily.  Continue to follow.  Thank you. Lorenda Peck, RD, LDN, CDE Inpatient Diabetes Coordinator [phone]

## 2020-05-15 NOTE — Progress Notes (Incomplete)
Inpatient Diabetes Program Recommendations  AACE/ADA: New Consensus Statement on Inpatient Glycemic Control (2015)  Target Ranges:  Prepandial:   less than 140 mg/dL      Peak postprandial:   less than 180 mg/dL (1-2 hours)      Critically ill patients:  140 - 180 mg/dL   Lab Results  Component Value Date   GLUCAP 354 (H) 05/15/2020   HGBA1C 8.8 (H) 05/15/2020    Review of Glycemic Control  Diabetes history: DM2 Outpatient Diabetes medications: U-500 20 units TID with meals Current orders for Inpatient glycemic control: Lantus 20 units QD, Novolog 0-20 units TID with meals and 0-5 HS + 5 units TID for meal coverage.  HgbA1C - 8.8% - improved from 10.2% Endo - Dr. Cruzita Lederer May benefit from restarting reduced amount of U-500 insulin if pt is eating.  Inpatient Diabetes Program Recommendations:

## 2020-05-15 NOTE — Evaluation (Signed)
Physical Therapy Evaluation Patient Details Name: [name] [name]: [id number] DOB: 08/15/1945 Today's Date: 05/15/2020   History of Present Illness  75 y.o. 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 Pt reports he's been non ambulatory for 6 months due to wounds on his feet, his physician just made him weight bearing as tolerated 6 days ago, but pt was not steady in standing so hadn't started walking yet. He has been independent with WC mobility for 6 months. He lives with his grandson. Pt performed stand pivot transfer x 3 with min assist, HR 144 at start of tx, 108 after administration of medication from RN during session. SaO2 94% on 4L O2 with activity. Pt had frequent cough, productive of thick yellow sputum. Pt declined ST-SNF, he prefers HHPT. Pt currently with functional limitations due to the deficits listed below (see PT Problem List). Pt will benefit from skilled PT to increase their independence and safety with mobility to allow discharge to the venue listed below.       Follow Up Recommendations Home health PT    Equipment Recommendations  None recommended by PT    Recommendations for Other Services       Precautions / Restrictions Precautions Precautions: Fall Precaution Comments: denies falls in past 1 year Restrictions Weight Bearing Restrictions: No      Mobility  Bed Mobility Overal bed mobility: Modified Independent Bed Mobility: Supine to Sit     Supine  to sit: Modified independent (Device/Increase time);HOB elevated     General bed mobility comments: HOB up, used rail    Transfers Overall transfer level: Needs assistance Equipment used: Rolling walker (2 wheeled) Transfers: Sit to/from [facility] Sit to Stand: Min assist Stand pivot transfers: Min guard       General transfer comment: assist to rise, SPT x 3 (bed to 3 in 1, then to bed then to recliner), VCs hand placement. Pt stood and marched in place with RW for 30 seconds.  HR 144 at start of tx, RN administer medication, HR then 108, SaO2 94% on 4L O2 [place] with activity. Pt with frequent cough productive of thick yellow sputum.  Ambulation/Gait                Stairs            Wheelchair Mobility    Modified Rankin (Stroke Patients Only)       Balance Overall balance assessment: Needs assistance   Sitting balance-Leahy Scale: Good     Standing balance support: Bilateral upper extremity supported Standing balance-Leahy Scale: Poor Standing balance comment: relies on BUE support                             Pertinent Vitals/Pain Pain Assessment: No/denies pain    Home Living Family/patient expects to be discharged to:: Private residence Living Arrangements: Other relatives Available Help at Discharge: Family;Available PRN/intermittently Type of Home: House Home Access: Ramped entrance     Home Layout: One level Home Equipment: Shower seat - built in;Walker -  4 wheels;Wheelchair - [employer] - 2 wheels      Prior Function Level of Independence: Needs assistance   Gait / Transfers Assistance Needed: independent WC transfers and transfer to shower seat  ADL's / Homemaking Assistance Needed: grandson assists with dressing, pt I with bathing in walking shower with seat  Comments: lives with grandson who works, has been NWB BLEs 2* foot wounds so used WC for ~6 months, just was told he is now WBAT 6 days ago but  hasn't been able to walk 2* feeling unsteady     Hand Dominance        Extremity/Trunk Assessment   Upper Extremity Assessment Upper Extremity Assessment: Overall WFL for tasks assessed    Lower Extremity Assessment Lower Extremity Assessment: Generalized weakness;RLE deficits/detail;LLE deficits/detail RLE Deficits / Details: knee ext 4/5 RLE Sensation: decreased light touch LLE Deficits / Details: knee ext 4/5 LLE Sensation: decreased light touch    Cervical / Trunk Assessment Cervical / Trunk Assessment: Normal  Communication   Communication: No difficulties  Cognition Arousal/Alertness: Awake/alert Behavior During Therapy: WFL for tasks assessed/performed Overall Cognitive Status: Within Functional Limits for tasks assessed                                        General Comments      Exercises Other Exercises Other Exercises: instructed pt in seated marching, LAQs, APs, shoulder flexion to be done independently for strengthening   Assessment/Plan    PT Assessment Patient needs continued PT services  PT Problem List Decreased strength;Decreased mobility;Decreased balance;Decreased activity tolerance       PT Treatment Interventions Gait training;Therapeutic activities;Functional mobility training;Therapeutic exercise;Patient/family education    PT Goals (Current goals can be found in the Care Plan section)  Acute Rehab PT Goals Patient Stated Goal: to be able to walk PT Goal Formulation: With patient Time For Goal Achievement: 05/29/20 Potential to Achieve Goals: Good    Frequency Min 3X/week   Barriers to discharge        Co-evaluation               AM-PAC PT "6 Clicks" Mobility  Outcome Measure Help needed turning from your back to your side while in a flat bed without using bedrails?: A Little Help needed moving from lying on your back to sitting on the side of a flat bed without using bedrails?: A Little Help needed moving  to and from a bed to a chair (including a wheelchair)?: A Little Help needed standing up from a chair using your arms (e.g., wheelchair or bedside chair)?: A Little Help needed to walk in hospital room?: A Lot Help needed climbing 3-5 steps with a railing? : A Lot 6 Click Score: 16    End of Session Equipment Utilized During Treatment: Gait belt;Oxygen Activity Tolerance: Patient tolerated treatment well Patient left: in chair;with call bell/phone within reach;with chair alarm set Nurse Communication: Mobility status PT Visit Diagnosis: Difficulty in walking, not elsewhere classified (R26.2)    Time: 6962-9528 PT Time Calculation (min) (ACUTE ONLY): 28 min   Charges:   PT Evaluation $PT Eval Low Complexity: 1 Low PT Treatments $Therapeutic Activity: 8-22 mins   Blondell Reveal Kistler PT 05/15/2020  Acute Rehabilitation Services Pager [phone] Office [phone]

## 2020-05-15 NOTE — Plan of Care (Signed)

## 2020-05-15 NOTE — Progress Notes (Signed)
PROGRESS NOTE  [name] MGQ:[id number] DOB: 1946/01/22 DOA: 05/14/2020 PCP: [name], MD   LOS: 1 day   Brief narrative:  [name] is a 75 y.o. male with medical history significant for small cell carcinoma of the right lung, on chemotherapy and radiation ,associated with right pleural effusion, paroxysmal atrial fibrillation not on anticoagulation, type 2 diabetes mellitus, stage IIIa chronic kidney disease with baseline creatinine 1.2-1.5, chronic anemia with baseline hemoglobin 7-9  presented to the hospital with 4-day history of shortness of breath cough generalized weakness.  There was no history of orthopnea PND.  Patient has received vaccinations for Covid.  In the ED, patient was slightly tachypneic and was noted to be hypoxic with pulse ox of 88% on room air.  Patient was given supplemental oxygen.  He also received IV Cardizem for atrial flutter.  Procalcitonin was less than 0.10.  COVID-19 PCR in the ED was positive..  Chest x-ray with unchanged partially loculated moderate/large right pleural effusion with diffuse reticular opacities in the perihilar region, also unchanged, and a small left pleural effusion, in the absence of any evidence of pneumothorax.  CTA of the chest was then performed without evidence of pulmonary embolism and moderate sized known partially loculated right pleural effusion, with slight increase in size relative to that on prior CTA chest.  Progressive right perihilar consolidation with occlusion of the right middle and lower lobe bronchi relative to compressive atelectasis observed in the right lower lobe on prior CTA, and also showed no interval change in right upper lobe cavitary lesion.   Patient was then admitted to hospital for further evaluation and treatment  Assessment/Plan:  Principal Problem:   COVID-19 virus infection Active Problems:   Atrial fibrillation with RVR (HCC)   SOB (shortness of breath)   Generalized weakness   Pleural  effusion, right   DM2 (diabetes mellitus, type 2) ([place])   Hyperlipidemia   Severe COVID-19 infection with acute hypoxic respiratory failure: COVID-19 PCR on 03/24/2020.  With hypoxic respiratory failure.  Continue steroids remdesivir.  Trend inflammatory markers.  Wean oxygen as able.  If worsening hypoxia or inflammatory markers could initiate further treatment.  Has been started on linagliptin.  Continue flutter valve, incentive spirometry, albuterol inhaler.  COVID-19 Labs  Recent Labs    05/14/20 1954 05/15/20 0452  DDIMER 3.42*  --   FERRITIN 1,120* 1,057*  LDH 133 108  CRP 6.9* 6.1*    Lab Results  Component Value Date   SARSCOV2NAA POSITIVE (A) 05/14/2020   SARSCOV2NAA Not Detected 05/13/2020   [name] NEGATIVE 03/24/2020   [place] NEGATIVE 03/11/2020    Atrial fibrillation with RVR: In the ED.  Not on anticoagulation as outpatient.  Received IV Cardizem. Not on chronic anticoagulant patient in spite of CHA2DS2-VASc score of 3.  Continue strict input output charting, daily weights. IV Lopressor as needed.  Continue Cardizem.  Has improved  Generalized weakness:  Likely secondary to severe COVID-19 infection, as above.    TSH within normal limits.  PT has been consulted for evaluation   Type 2 diabetes mellitus:  hemoglobin A1c noted to be 8.8%.  On 40 units 3 times daily with meals.  We will add a sliding scale insulin, long-acting insulin at this time.  Consult dietary coordinator.  Likely will be uncontrolled in the setting of steroids.  Hyperlipidemia: On simvastatin as an outpatient.  Continue statins.  Stage IIIa chronic kidney disease:  baseline creatinine range 1.2-1.5, at baseline at this time.  Continue  intake and output charting.   Chronic anemia:   baseline hemoglobin range of 7-9.  Ferritin elevated in the context of Covid illness.,  Follow TIBC, total iron, MMA, folic acid.  Hemoglobin of 7.3 today  DVT prophylaxis: enoxaparin (LOVENOX)  injection 40 mg Start: 05/14/20 2200 SCDs Start: 05/14/20 1950   Code Status: Full code  Family Communication: I spoke with the patient's grandson on the phone and updated him about the clinical condition of the patient.  Status is: Inpatient  Remains inpatient appropriate because:IV treatments appropriate due to intensity of illness or inability to take PO and Inpatient level of care appropriate due to severity of illness  Dispo: The patient is from: Home              Anticipated d/c is to: Home              Anticipated d/c date is: 2 days              Patient currently is not medically stable to d/c.   Difficult to place patient No  Consultants:  None  Procedures:  None  Anti-infectives:  . Remdesivir 2/2>  Anti-infectives (From admission, onward)   Start     Dose/Rate Route Frequency Ordered Stop   05/15/20 1000  remdesivir 100 mg in sodium chloride 0.9 % 100 mL IVPB       "Followed by" Linked Group Details   100 mg 200 mL/hr over 30 Minutes Intravenous Daily 05/14/20 2002 05/19/20 0959   05/14/20 2030  remdesivir 200 mg in sodium chloride 0.9% 250 mL IVPB       "Followed by" Linked Group Details   200 mg 580 mL/hr over 30 Minutes Intravenous Once 05/14/20 2002 05/14/20 2113     Subjective: Today, patient was seen and examined at bedside.  Complains of cough with some productive sputum with color.  No fever or chills.  Has any pain,   Objective: Vitals:   05/15/20 0429 05/15/20 0742  BP: (!) 146/100 (!) 120/92  Pulse: (!) 104 (!) 59  Resp: 20 20  Temp: 97.8 F (36.6 C) 97.8 F (36.6 C)  SpO2: 97% 99%    Intake/Output Summary (Last 24 hours) at 05/15/2020 1023 Last data filed at 05/14/2020 2113 Gross per 24 hour  Intake 1250 ml  Output -  Net 1250 ml   Filed Weights   05/14/20 2330 05/15/20 0500  Weight: 85.5 kg 87.3 kg   Body mass index is 26.84 kg/m.   Physical Exam:  GENERAL: Patient is alert awake and oriented. Not in obvious distress.  On 4  L of oxygen by nasal cannula. HENT: No scleral pallor or icterus. Pupils equally reactive to light. Oral mucosa is moist NECK: is supple, no gross swelling noted. CHEST: Coarse breath sounds bilaterally.  Diminished breath sounds bilaterally. CVS: S1 and S2 heard, no murmur. Regular rate and rhythm.  ABDOMEN: Soft, non-tender, bowel sounds are present. EXTREMITIES: No edema. CNS: Cranial nerves are intact. No focal motor deficits. SKIN: warm and dry without rashes.  Data Review: I have personally reviewed the following laboratory data and studies,  CBC: Recent Labs  Lab 05/13/20 1319 05/14/20 1433 05/15/20 0452  WBC 4.3 4.7 3.9*  NEUTROABS 3.3  --  3.7  HGB 7.8 Repeated and verified X2.* 8.1* 7.3*  HCT 23.8 Repeated and verified X2.* 25.7* 24.0*  MCV 92.9 97.0 99.2  PLT 174.0 167 381*   Basic Metabolic Panel: Recent Labs  Lab 05/13/20 1319 05/14/20 1433  05/14/20 1954 05/15/20 0452  NA 135 138  --  136  K 4.2 4.5  --  4.0  CL 97 99  --  98  CO2 31 29  --  25  GLUCOSE 336* 425*  --  304*  BUN 25* 24*  --  21  CREATININE 1.40 1.36*  --  1.22  CALCIUM 9.0 8.9  --  8.7*  MG  --   --  1.5* 1.9  PHOS  --   --   --  3.9   Liver Function Tests: Recent Labs  Lab 05/13/20 1319 05/14/20 1433 05/15/20 0452  AST 7 8* 8*  ALT 7 10 11   ALKPHOS 84 87 86  BILITOT 0.4 0.6 0.9  PROT 7.0 7.4 6.7  ALBUMIN 3.2* 3.0* 2.8*   No results for input(s): LIPASE, AMYLASE in the last 168 hours. No results for input(s): AMMONIA in the last 168 hours. Cardiac Enzymes: No results for input(s): CKTOTAL, CKMB, CKMBINDEX, TROPONINI in the last 168 hours. BNP (last 3 results) Recent Labs    03/24/20 1426  BNP 537.5*    ProBNP (last 3 results) No results for input(s): PROBNP in the last 8760 hours.  CBG: Recent Labs  Lab 05/14/20 2136 05/15/20 0738  GLUCAP 216* 285*   Recent Results (from the past 240 hour(s))  Novel Coronavirus, NAA ([facility])     Status: None   Collection  Time: 05/13/20 12:00 AM   Specimen: Oropharyngeal(OP) collection in vial transport medium   Oropharyngea  Result Value Ref Range Status   SARS-CoV-2, NAA Not Detected Not Detected Final    Comment: This nucleic acid amplification test was developed and its performance characteristics determined by [employer]. Nucleic acid amplification tests include RT-PCR and TMA. This test has not been FDA cleared or approved. This test has been authorized by FDA under an Emergency Use Authorization (EUA). This test is only authorized for the duration of time the declaration that circumstances exist justifying the authorization of the emergency use of in vitro diagnostic tests for detection of SARS-CoV-2 virus and/or diagnosis of COVID-19 infection under section 564(b)(1) of the Act, 21 U.S.C. 093OIZ-1(I) (1), unless the authorization is terminated or revoked sooner. When diagnostic testing is negative, the possibility of a false negative result should be considered in the context of a patient's recent exposures and the presence of clinical signs and symptoms consistent with COVID-19. An individual without symptoms of COVID-19 and who is not shedding SARS-CoV-2 virus wo uld expect to have a negative (not detected) result in this assay.   SARS-COV-2, NAA 2 DAY TAT     Status: None   Collection Time: 05/13/20 12:00 AM   Oropharyngea  Result Value Ref Range Status   SARS-CoV-2, NAA 2 DAY TAT Performed  Final  Culture, blood (routine x 2)     Status: None (Preliminary result)   Collection Time: 05/14/20  5:10 PM   Specimen: BLOOD  Result Value Ref Range Status   Specimen Description   Final    BLOOD PORTA CATH Performed at [facility] [address] [place], [place] [address]    Special Requests   Final    BOTTLES DRAWN AEROBIC AND ANAEROBIC Blood Culture adequate volume Performed at [facility] [address] [place], [place] [address]     Culture   Final    NO GROWTH < 12 HOURS Performed at [facility] [address] [place], [place] [address]    Report  Status PENDING  Incomplete  SARS Coronavirus 2 by RT PCR (hospital order, performed in [facility] hospital lab) Nasopharyngeal Nasopharyngeal Swab     Status: Abnormal   Collection Time: 05/14/20  5:10 PM   Specimen: Nasopharyngeal Swab  Result Value Ref Range Status   SARS Coronavirus 2 POSITIVE (A) NEGATIVE Final    Comment: RESULT CALLED TO, READ BACK BY AND VERIFIED WITH: BROOKS,B. RN @1856  ON 02.02.2022 BY COHEN,K (NOTE) SARS-CoV-2 target nucleic acids are DETECTED  SARS-CoV-2 RNA is generally detectable in upper respiratory specimens  during the acute phase of infection.  Positive results are indicative  of the presence of the identified virus, but do not rule out bacterial infection or co-infection with other pathogens not detected by the test.  Clinical correlation with patient history and  other diagnostic information is necessary to determine patient infection status.  The expected result is negative.  Fact Sheet for Patients:   StrictlyIdeas.no   Fact Sheet for Healthcare Providers:   BankingDealers.co.za    This test is not yet approved or cleared by the [place] FDA and  has been authorized for detection and/or diagnosis of SARS-CoV-2 by FDA under an Emergency Use Authorization (EUA).  This EUA will remain in effect (meanin g this test can be used) for the duration of  the COVID-19 declaration under Section 564(b)(1) of the Act, 21 U.S.C. section 360-bbb-3(b)(1), unless the authorization is terminated or revoked sooner.  Performed at [facility], [place] [address] [place], [place] [address]      Studies: DG Chest 2 View  Result Date: 05/14/2020 CLINICAL DATA:  Worsening shortness of breath for 4 days with productive cough. Active treatment for lung cancer. EXAM: CHEST  - 2 VIEW COMPARISON:  Chest radiographs yesterday.  Most recent CT 03/24/2020 FINDINGS: Right chest port remains in place. Moderate to large partially loculated right pleural effusion is not significantly changed from yesterday. Opacities throughout the right hemithorax, confluent in the perihilar region, also unchanged. Cavitary component on prior CT is not well seen by radiograph. Stable volume loss in the right hemithorax. Small left pleural effusion is similar. Background chronic bronchial thickening. Minor atelectasis at the left lung base. No pneumothorax. Stable osseous structures. IMPRESSION: 1. No significant change from yesterday. 2. Unchanged partially loculated moderate to large right pleural effusion. Diffuse right lung opacities, confluent in the perihilar region, patient with known lung cancer. Cavitary component on prior CT is not well demonstrated. 3. Small left pleural effusion is similar. 4. Chronic bronchial thickening. Electronically Signed   By: Keith Rake M.D.   On: 05/14/2020 14:59   DG Chest 2 View  Result Date: 05/14/2020 CLINICAL DATA:  Cough, shortness of breath EXAM: CHEST - 2 VIEW COMPARISON:  03/26/2020 FINDINGS: Right Port-A-Cath remains in place, unchanged. Right pleural effusion and airspace disease throughout the right lung, similar to prior study. Minimal left base opacity, likely atelectasis. Small left pleural effusion. Heart is normal size. IMPRESSION: Moderate to large right pleural effusion with diffuse right lung airspace disease, unchanged. Small left effusion with left base atelectasis. Electronically Signed   By: Rolm Baptise M.D.   On: 05/14/2020 11:06   CT Angio Chest PE W and/or Wo Contrast  Result Date: 05/14/2020 CLINICAL DATA:  PE suspected, high prob Progressive shortness of breath for 4 days. Productive cough. Active treatment for lung cancer. EXAM: CT ANGIOGRAPHY CHEST WITH CONTRAST TECHNIQUE: Multidetector CT imaging of the chest was performed using  the standard protocol during bolus administration  of intravenous contrast. Multiplanar CT image reconstructions and MIPs were obtained to evaluate the vascular anatomy. CONTRAST:  69mL OMNIPAQUE IOHEXOL 350 MG/ML SOLN COMPARISON:  Radiograph earlier today. Most recent chest CTA 03/24/2020 FINDINGS: Cardiovascular: There are no filling defects within the pulmonary arteries to suggest pulmonary embolus. The right upper lobe pulmonary arteries are attenuated. Aortic atherosclerosis without acute aortic finding. Right chest port with tip in the right atrium. Normal heart size. No pericardial effusion or thickening. Mediastinum/Nodes: 19 mm right lower paratracheal node, series 4, image 58, previously 22 mm. Multiple additional small mediastinal lymph nodes are not enlarged by size criteria. No definite new adenopathy. 12 mm right thyroid nodule. Not clinically significant; no follow-up imaging recommended (ref: J Am Coll Radiol. 2015 Feb;12(2): 143-50).No esophageal wall thickening. Lungs/Pleura: Moderately large partially loculated right pleural effusion. This has increased in size from prior exam with increased atelectasis in the right lower lobe. Right upper lobe cavitary lesion with air-fluid level, slightly increased, currently 5.9 cm, previously 4.6 cm by my retrospective measurement. Exact measurement difficult due to the ill-defined adjacent soft tissue density. Diffuse right perihilar consolidation and soft tissue density, progressed. Ill-defined spiculated densities in the aerated right upper lobe. There is narrowing of the right mainstem bronchus, with complete occlusion of the right middle and lower lobe bronchi, difficult to delineate if this is due to secretions or external compression. Right middle lobe appears completely atelectatic. Moderate left pleural effusion is similar. 2 small left upper lobe pulmonary nodules peripherally measuring 5 and 6 mm, both on series 6, image 51, not seen on prior.  Irregular subpleural density with ill-defined margins in the anterior left upper lobe, series 6, image 77, also new. Breathing motion artifact limits detailed assessment. There is no pneumothorax. Upper Abdomen: Upper abdominal atherosclerosis. Cholecystectomy. No acute upper abdominal findings. Musculoskeletal: No evidence of focal bone lesion. No acute osseous abnormalities are seen. Degenerative change throughout the spine. Review of the MIP images confirms the above findings. IMPRESSION: 1. No pulmonary embolus. 2. Moderately large partially loculated right pleural effusion which has increased in size from prior exam. Progressive right perihilar consolidation and soft tissue density with narrowing of the right mainstem bronchus and complete occlusion of the right middle and lower lobe bronchi. It is unclear if bronchial occlusion is due to retained secretions/mucus or external compression. Right middle lobe is completely atelectatic. 3. Cavitary lesion in the right upper lobe with equivocal increase in size from prior exam. 4. Two small left upper lobe pulmonary nodules measuring 5 and 6 mm, not seen on prior exam, nonspecific. No ill-defined subpleural density in the anterior left upper lobe. Recommend attention at follow-up. 5. Decreased size of right lower paratracheal node. Aortic Atherosclerosis (ICD10-I70.0). Electronically Signed   By: Keith Rake M.D.   On: 05/14/2020 18:27   DG CHEST PORT 1 VIEW  Result Date: 05/15/2020 CLINICAL DATA:  Pleural effusion. EXAM: PORTABLE CHEST 1 VIEW COMPARISON:  CT 05/14/2020.  Chest x-ray 05/14/2020. FINDINGS: PowerPort catheter stable position. Heart size stable. Right mainstem bronchus may be occluded. Progressive opacification right upper hemithorax most consistent with progressive atelectasis/infiltrate and right pleural effusion. Pleural effusion may be loculated. Diffuse left lung infiltrate/edema noted on today's exam. Small left pleural effusion also  noted. No pneumothorax. IMPRESSION: 1. PowerPort catheter in stable position. 2. Right mainstem bronchus may be occluded. Progressive opacification right upper hemithorax most consistent with progressive atelectasis/infiltrate and right pleural effusion. Pleural effusion may be loculated. 3. Diffuse left lung infiltrate/edema and small left  pleural effusion noted on today's exam. Electronically Signed   By: Marcello Moores  Register   On: 05/15/2020 06:51      Flora Lipps, MD  [facility] 05/15/2020  If 7PM-7AM, please contact night-coverage

## 2020-05-15 NOTE — Progress Notes (Signed)
Patient has not had any urinary output since 2330 on 05/14/20 and doesn't feel the need to void.  Bladder scan done with values 240-340 ml, messaged on call provider regarding the above.

## 2020-05-16 ENCOUNTER — Ambulatory Visit: Payer: Medicare Other

## 2020-05-16 DIAGNOSIS — E119 Type 2 diabetes mellitus without complications: Secondary | ICD-10-CM | POA: Diagnosis not present

## 2020-05-16 DIAGNOSIS — R531 Weakness: Secondary | ICD-10-CM | POA: Diagnosis not present

## 2020-05-16 DIAGNOSIS — I4891 Unspecified atrial fibrillation: Secondary | ICD-10-CM | POA: Diagnosis not present

## 2020-05-16 DIAGNOSIS — U071 COVID-19: Secondary | ICD-10-CM | POA: Diagnosis not present

## 2020-05-16 LAB — COMPREHENSIVE METABOLIC PANEL
ALT: 12 U/L (ref 0–44)
AST: 9 U/L — ABNORMAL LOW (ref 15–41)
Albumin: 2.8 g/dL — ABNORMAL LOW (ref 3.5–5.0)
Alkaline Phosphatase: 86 U/L (ref 38–126)
Anion gap: 11 (ref 5–15)
BUN: 34 mg/dL — ABNORMAL HIGH (ref 8–23)
CO2: 27 mmol/L (ref 22–32)
Calcium: 9 mg/dL (ref 8.9–10.3)
Chloride: 99 mmol/L (ref 98–111)
Creatinine, Ser: 1.55 mg/dL — ABNORMAL HIGH (ref 0.61–1.24)
GFR, Estimated: 47 mL/min — ABNORMAL LOW (ref >60)
Glucose, Bld: 364 mg/dL — ABNORMAL HIGH (ref 70–99)
Potassium: 4 mmol/L (ref 3.5–5.1)
Sodium: 137 mmol/L (ref 135–145)
Total Bilirubin: 0.5 mg/dL (ref 0.3–1.2)
Total Protein: 6.8 g/dL (ref 6.5–8.1)

## 2020-05-16 LAB — CBC WITH DIFFERENTIAL/PLATELET
Abs Immature Granulocytes: 0.03 10*3/uL (ref 0.00–0.07)
Basophils Absolute: 0 10*3/uL (ref 0.0–0.1)
Basophils Relative: 0 %
Eosinophils Absolute: 0 10*3/uL (ref 0.0–0.5)
Eosinophils Relative: 0 %
HCT: 24.7 % — ABNORMAL LOW (ref 39.0–52.0)
Hemoglobin: 7.7 g/dL — ABNORMAL LOW (ref 13.0–17.0)
Immature Granulocytes: 0 %
Lymphocytes Relative: 3 %
Lymphs Abs: 0.2 10*3/uL — ABNORMAL LOW (ref 0.7–4.0)
MCH: 30.2 pg (ref 26.0–34.0)
MCHC: 31.2 g/dL (ref 30.0–36.0)
MCV: 96.9 fL (ref 80.0–100.0)
Monocytes Absolute: 0.2 10*3/uL (ref 0.1–1.0)
Monocytes Relative: 2 %
Neutro Abs: 6.6 10*3/uL (ref 1.7–7.7)
Neutrophils Relative %: 95 %
Platelets: 162 10*3/uL (ref 150–400)
RBC: 2.55 MIL/uL — ABNORMAL LOW (ref 4.22–5.81)
RDW: 15.6 % — ABNORMAL HIGH (ref 11.5–15.5)
WBC: 6.9 10*3/uL (ref 4.0–10.5)
nRBC: 0 % (ref 0.0–0.2)

## 2020-05-16 LAB — GLUCOSE, CAPILLARY
Glucose-Capillary: 356 mg/dL — ABNORMAL HIGH (ref 70–99)
Glucose-Capillary: 318 mg/dL — ABNORMAL HIGH (ref 70–99)
Glucose-Capillary: 269 mg/dL — ABNORMAL HIGH (ref 70–99)
Glucose-Capillary: 171 mg/dL — ABNORMAL HIGH (ref 70–99)
Glucose-Capillary: 105 mg/dL — ABNORMAL HIGH (ref 70–99)

## 2020-05-16 LAB — FERRITIN: Ferritin: 945 ng/mL — ABNORMAL HIGH (ref 24–336)

## 2020-05-16 LAB — FIBRINOGEN: Fibrinogen: 557 mg/dL — ABNORMAL HIGH (ref 210–475)

## 2020-05-16 LAB — D-DIMER, QUANTITATIVE: D-Dimer, Quant: 3.31 ug/mL-FEU — ABNORMAL HIGH (ref 0.00–0.50)

## 2020-05-16 LAB — MAGNESIUM: Magnesium: 1.7 mg/dL (ref 1.7–2.4)

## 2020-05-16 LAB — METHYLMALONIC ACID, SERUM: Methylmalonic Acid, Quantitative: 387 nmol/L — ABNORMAL HIGH (ref 0–378)

## 2020-05-16 LAB — C-REACTIVE PROTEIN: CRP: 3.9 mg/dL — ABNORMAL HIGH (ref <1.0)

## 2020-05-16 MED ORDER — HYDRALAZINE HCL 20 MG/ML IJ SOLN
10.0000 mg | Freq: Four times a day (QID) | INTRAMUSCULAR | Status: DC | PRN
  Administered 2020-05-16: 10 mg via INTRAVENOUS
  Filled 2020-05-16: qty 1

## 2020-05-16 NOTE — TOC Progression Note (Signed)
Transition of Care [facility]) - Progression Note    Patient Details  Name: [name] [name]: [id number] Date of Birth: May 10, 1945  Transition of [facility]) CM/SW Contact  Ross Ludwig, [place] Phone Number: 05/16/2020, 10:29 PM  Clinical Narrative:     CSW was informed that patient's wife would like [facility] for home health.  [facility] was contacted and can accept patient for [facility].  Patient will need new HHPT orders.  CSW to continue to follow patient's progress throughout discharge planning.   Expected Discharge Plan: [place] Barriers to Discharge: No Barriers Identified  Expected Discharge Plan and Services Expected Discharge Plan: [place] arrangements for the past 2 months: Single Family Home                                       Social Determinants of Health (SDOH) Interventions    Readmission Risk Interventions No flowsheet data found.

## 2020-05-16 NOTE — Consult Note (Signed)
   [facility] Inpatient Consult   05/16/2020  [name] April 04, 1946 [id number]   Patient is currently active with [facility] for chronic disease management services.   Plan: Will continue to follow for progression and disposition plans and update community care manager of patient hospital progression.   Of note, [facility] services does not replace or interfere with any services that are arranged by inpatient case management or social work.  Netta Cedars, MSN, [facility] Liaison Nurse Mobile Phone [phone]  Toll free office [phone]

## 2020-05-16 NOTE — Progress Notes (Signed)
PROGRESS NOTE  [name] EAV:[id number] DOB: October 11, 1945 DOA: 05/14/2020 PCP: [name], MD   LOS: 2 days   Brief narrative:  [name] is a 75 y.o. male with medical history significant for small cell carcinoma of the right lung, on chemotherapy and radiation ,associated with right pleural effusion, paroxysmal atrial fibrillation not on anticoagulation, type 2 diabetes mellitus, stage IIIa chronic kidney disease with baseline creatinine 1.2-1.5, chronic anemia with baseline hemoglobin 7-9  presented to the hospital with 4-day history of shortness of breath cough generalized weakness.  There was no history of orthopnea PND.  Patient has received vaccinations for Covid.  In the ED, patient was slightly tachypneic and was noted to be hypoxic with pulse ox of 88% on room air.  Patient was given supplemental oxygen.  He also received IV Cardizem for atrial flutter.  Procalcitonin was less than 0.10.  COVID-19 PCR in the ED was positive..  Chest x-ray with unchanged partially loculated moderate/large right pleural effusion with diffuse reticular opacities in the perihilar region, also unchanged, and a small left pleural effusion, in the absence of any evidence of pneumothorax.  CTA of the chest was then performed without evidence of pulmonary embolism and moderate sized known partially loculated right pleural effusion, with slight increase in size relative to that on prior CTA chest.  Progressive right perihilar consolidation with occlusion of the right middle and lower lobe bronchi relative to compressive atelectasis observed in the right lower lobe on prior CTA, and also showed no interval change in right upper lobe cavitary lesion.   Patient was then admitted to hospital for further evaluation and treatment.  2/4: Patient has felt a little better today.  Still on 4 L of nasal cannula oxygen.   Assessment/Plan:  Principal Problem:   COVID-19 virus infection Active Problems:   Atrial  fibrillation with RVR (HCC)   SOB (shortness of breath)   Generalized weakness   Pleural effusion, right   DM2 (diabetes mellitus, type 2) ([place])   Hyperlipidemia   Severe COVID-19 infection with acute hypoxic respiratory failure: COVID-19 PCR on 03/24/2020.  Continue IV Solu-Medrol, IV remdesivir.  Currently on 4 L of oxygen by nasal cannula.  Wean as able.  Trend inflammatory markers.  Trending down overall.  Continue flutter valve, incentive spirometry, albuterol inhaler.  COVID-19 Labs  Recent Labs    05/14/20 1954 05/15/20 0452 05/16/20 0418  DDIMER 3.42*  --  3.31*  FERRITIN 1,120* 1,057* 945*  LDH 133 108  --   CRP 6.9* 6.1* 3.9*    Lab Results  Component Value Date   SARSCOV2NAA POSITIVE (A) 05/14/2020   SARSCOV2NAA Not Detected 05/13/2020   [place] NEGATIVE 03/24/2020   [place] NEGATIVE 03/11/2020    Atrial fibrillation with RVR: On presentation.  Improved.  Currently on IV metoprolol, p.o. Cardizem.   Not on anticoagulation as outpatient. CHA2DS2-VASc score of 3.  Generalized weakness:  Likely secondary to severe COVID-19 infection, TSH within normal limits.  PT has seen the patient and recommend  home PT on discharge  Type 2 diabetes mellitus:  Hemoglobin A1c noted to be 8.8%.  Continue insulin regimen at this time.  Diabetic coordinator on board.  Continue linagliptin, was initiated during this admission.  Hyperlipidemia: Continue simvastatin.  LFTs within normal limits  Stage IIIa chronic kidney disease:  baseline creatinine range 1.2-1.5, at baseline at this time.  Continue to monitor closely.  Creatinine of 1.5 today.  Anemia of chronic disease.  baseline hemoglobin range of  7-9.  Ferritin elevated in the context of Covid illness.  Folate level within normal limits.  MMA pending. total iron, TIBC and saturation low. Hemoglobin of 7.7 today.  DVT prophylaxis: enoxaparin (LOVENOX) injection 40 mg Start: 05/14/20 2200 SCDs Start: 05/14/20  1950   Code Status: Full code  Family Communication:  I again spoke with the patient's grandson on the phone and updated him about the clinical condition of the patient.  Status is: Inpatient  Remains inpatient appropriate because:IV treatments appropriate due to intensity of illness or inability to take PO and Inpatient level of care appropriate due to severity of illness, acute hypoxic respiratory failure  Dispo: The patient is from: Home              Anticipated d/c is to: Home              Anticipated d/c date is: 2 days              Patient currently is not medically stable to d/c.   Difficult to place patient No  Consultants:  None  Procedures:  None  Anti-infectives:  . Remdesivir 2/2>  Anti-infectives (From admission, onward)   Start     Dose/Rate Route Frequency Ordered Stop   05/15/20 1000  remdesivir 100 mg in sodium chloride 0.9 % 100 mL IVPB       "Followed by" Linked Group Details   100 mg 200 mL/hr over 30 Minutes Intravenous Daily 05/14/20 2002 05/19/20 0959   05/14/20 2030  remdesivir 200 mg in sodium chloride 0.9% 250 mL IVPB       "Followed by" Linked Group Details   200 mg 580 mL/hr over 30 Minutes Intravenous Once 05/14/20 2002 05/14/20 2113     Subjective: Today, patient was seen and examined at bedside. Complains of mild productive cough, no fever or chills.  Denies any nausea vomiting or abdominal pain.  Objective: Vitals:   05/15/20 2133 05/16/20 0407  BP: (!) 136/97 (!) 148/98  Pulse: (!) 48 (!) 110  Resp: 20 20  Temp: 97.8 F (36.6 C) (!) 97.5 F (36.4 C)  SpO2: 99% 98%    Intake/Output Summary (Last 24 hours) at 05/16/2020 1143 Last data filed at 05/16/2020 0844 Gross per 24 hour  Intake 100 ml  Output 850 ml  Net -750 ml   Filed Weights   05/14/20 2330 05/15/20 0500 05/16/20 0407  Weight: 85.5 kg 87.3 kg 89 kg   Body mass index is 27.37 kg/m.   Physical Exam:  GENERAL: Patient is alert awake and oriented. Not in obvious  distress.  On 4 L of oxygen by nasal cannula. HENT: No scleral pallor or icterus. Pupils equally reactive to light. Oral mucosa is moist NECK: is supple, no gross swelling noted. CHEST: Decreased breath sounds bilaterally, coarse breath sound.  Port in the right chest wall. CVS: S1 and S2 heard, no murmur. Regular rate and rhythm.  ABDOMEN: Soft, non-tender, bowel sounds are present.  External urinary catheter in place EXTREMITIES: No edema. CNS: Cranial nerves are intact. No focal motor deficits. SKIN: warm and dry without rashes.  Data Review: I have personally reviewed the following laboratory data and studies,  CBC: Recent Labs  Lab 05/13/20 1319 05/14/20 1433 05/15/20 0452 05/16/20 0418  WBC 4.3 4.7 3.9* 6.9  NEUTROABS 3.3  --  3.7 6.6  HGB 7.8 Repeated and verified X2.* 8.1* 7.3* 7.7*  HCT 23.8 Repeated and verified X2.* 25.7* 24.0* 24.7*  MCV 92.9 97.0 99.2  96.9  PLT 174.0 167 127* 297   Basic Metabolic Panel: Recent Labs  Lab 05/13/20 1319 05/14/20 1433 05/14/20 1954 05/15/20 0452 05/16/20 0418  NA 135 138  --  136 137  K 4.2 4.5  --  4.0 4.0  CL 97 99  --  98 99  CO2 31 29  --  25 27  GLUCOSE 336* 425*  --  304* 364*  BUN 25* 24*  --  21 34*  CREATININE 1.40 1.36*  --  1.22 1.55*  CALCIUM 9.0 8.9  --  8.7* 9.0  MG  --   --  1.5* 1.9 1.7  PHOS  --   --   --  3.9  --    Liver Function Tests: Recent Labs  Lab 05/13/20 1319 05/14/20 1433 05/15/20 0452 05/16/20 0418  AST 7 8* 8* 9*  ALT 7 10 11 12   ALKPHOS 84 87 86 86  BILITOT 0.4 0.6 0.9 0.5  PROT 7.0 7.4 6.7 6.8  ALBUMIN 3.2* 3.0* 2.8* 2.8*   No results for input(s): LIPASE, AMYLASE in the last 168 hours. No results for input(s): AMMONIA in the last 168 hours. Cardiac Enzymes: No results for input(s): CKTOTAL, CKMB, CKMBINDEX, TROPONINI in the last 168 hours. BNP (last 3 results) Recent Labs    03/24/20 1426  BNP 537.5*    ProBNP (last 3 results) No results for input(s): PROBNP in the last  8760 hours.  CBG: Recent Labs  Lab 05/15/20 0738 05/15/20 1127 05/15/20 1753 05/15/20 2136 05/16/20 0738  GLUCAP 285* 354* 142* 221* 356*   Recent Results (from the past 240 hour(s))  Novel Coronavirus, NAA ([facility])     Status: None   Collection Time: 05/13/20 12:00 AM   Specimen: Oropharyngeal(OP) collection in vial transport medium   Oropharyngea  Result Value Ref Range Status   SARS-CoV-2, NAA Not Detected Not Detected Final    Comment: This nucleic acid amplification test was developed and its performance characteristics determined by [employer]. Nucleic acid amplification tests include RT-PCR and TMA. This test has not been FDA cleared or approved. This test has been authorized by FDA under an Emergency Use Authorization (EUA). This test is only authorized for the duration of time the declaration that circumstances exist justifying the authorization of the emergency use of in vitro diagnostic tests for detection of SARS-CoV-2 virus and/or diagnosis of COVID-19 infection under section 564(b)(1) of the Act, 21 U.S.C. 989QJJ-9(E) (1), unless the authorization is terminated or revoked sooner. When diagnostic testing is negative, the possibility of a false negative result should be considered in the context of a patient's recent exposures and the presence of clinical signs and symptoms consistent with COVID-19. An individual without symptoms of COVID-19 and who is not shedding SARS-CoV-2 virus wo uld expect to have a negative (not detected) result in this assay.   SARS-COV-2, NAA 2 DAY TAT     Status: None   Collection Time: 05/13/20 12:00 AM   Oropharyngea  Result Value Ref Range Status   SARS-CoV-2, NAA 2 DAY TAT Performed  Final  Culture, blood (routine x 2)     Status: None (Preliminary result)   Collection Time: 05/14/20  5:10 PM   Specimen: BLOOD  Result Value Ref Range Status   Specimen Description   Final    BLOOD PORTA CATH Performed at [facility] [address] [place], [place] [address]    Special Requests   Final    BOTTLES DRAWN AEROBIC  AND ANAEROBIC Blood Culture adequate volume Performed at [facility] [address] [place], [place] [address]    Culture   Final    NO GROWTH 2 DAYS Performed at [facility] [address], [place], [place] [address]    Report Status PENDING  Incomplete  SARS Coronavirus 2 by RT PCR (hospital order, performed in [facility] hospital lab) Nasopharyngeal Nasopharyngeal Swab     Status: Abnormal   Collection Time: 05/14/20  5:10 PM   Specimen: Nasopharyngeal Swab  Result Value Ref Range Status   SARS Coronavirus 2 POSITIVE (A) NEGATIVE Final    Comment: RESULT CALLED TO, READ BACK BY AND VERIFIED WITH: BROOKS,B. RN @1856  ON 02.02.2022 BY COHEN,K (NOTE) SARS-CoV-2 target nucleic acids are DETECTED  SARS-CoV-2 RNA is generally detectable in upper respiratory specimens  during the acute phase of infection.  Positive results are indicative  of the presence of the identified virus, but do not rule out bacterial infection or co-infection with other pathogens not detected by the test.  Clinical correlation with patient history and  other diagnostic information is necessary to determine patient infection status.  The expected result is negative.  Fact Sheet for Patients:   StrictlyIdeas.no   Fact Sheet for Healthcare Providers:   BankingDealers.co.za    This test is not yet approved or cleared by the [place] FDA and  has been authorized for detection and/or diagnosis of SARS-CoV-2 by FDA under an Emergency Use Authorization (EUA).  This EUA will remain in effect (meanin g this test can be used) for the duration of  the COVID-19 declaration under Section 564(b)(1) of the Act, 21 U.S.C. section 360-bbb-3(b)(1), unless the authorization is terminated or revoked sooner.  Performed at [facility], [place] [address], [place], [place] [address]      Studies: DG Chest 2 View  Result Date: 05/14/2020 CLINICAL DATA:  Worsening shortness of breath for 4 days with productive cough. Active treatment for lung cancer. EXAM: CHEST - 2 VIEW COMPARISON:  Chest radiographs yesterday.  Most recent CT 03/24/2020 FINDINGS: Right chest port remains in place. Moderate to large partially loculated right pleural effusion is not significantly changed from yesterday. Opacities throughout the right hemithorax, confluent in the perihilar region, also unchanged. Cavitary component on prior CT is not well seen by radiograph. Stable volume loss in the right hemithorax. Small left pleural effusion is similar. Background chronic bronchial thickening. Minor atelectasis at the left lung base. No pneumothorax. Stable osseous structures. IMPRESSION: 1. No significant change from yesterday. 2. Unchanged partially loculated moderate to large right pleural effusion. Diffuse right lung opacities, confluent in the perihilar region, patient with known lung cancer. Cavitary component on prior CT is not well demonstrated. 3. Small left pleural effusion is similar. 4. Chronic bronchial thickening. Electronically Signed   By: Keith Rake M.D.   On: 05/14/2020 14:59   CT Angio Chest PE W and/or Wo Contrast  Result Date: 05/14/2020 CLINICAL DATA:  PE suspected, high prob Progressive shortness of breath for 4 days. Productive cough. Active treatment for lung cancer. EXAM: CT ANGIOGRAPHY CHEST WITH CONTRAST TECHNIQUE: Multidetector CT imaging of the chest was performed using the standard protocol during bolus administration of intravenous contrast. Multiplanar CT image reconstructions and MIPs were obtained to evaluate the vascular anatomy. CONTRAST:  14mL OMNIPAQUE IOHEXOL 350 MG/ML SOLN COMPARISON:  Radiograph earlier today. Most recent chest CTA 03/24/2020 FINDINGS: Cardiovascular: There are no filling defects  within the pulmonary arteries  to suggest pulmonary embolus. The right upper lobe pulmonary arteries are attenuated. Aortic atherosclerosis without acute aortic finding. Right chest port with tip in the right atrium. Normal heart size. No pericardial effusion or thickening. Mediastinum/Nodes: 19 mm right lower paratracheal node, series 4, image 58, previously 22 mm. Multiple additional small mediastinal lymph nodes are not enlarged by size criteria. No definite new adenopathy. 12 mm right thyroid nodule. Not clinically significant; no follow-up imaging recommended (ref: J Am Coll Radiol. 2015 Feb;12(2): 143-50).No esophageal wall thickening. Lungs/Pleura: Moderately large partially loculated right pleural effusion. This has increased in size from prior exam with increased atelectasis in the right lower lobe. Right upper lobe cavitary lesion with air-fluid level, slightly increased, currently 5.9 cm, previously 4.6 cm by my retrospective measurement. Exact measurement difficult due to the ill-defined adjacent soft tissue density. Diffuse right perihilar consolidation and soft tissue density, progressed. Ill-defined spiculated densities in the aerated right upper lobe. There is narrowing of the right mainstem bronchus, with complete occlusion of the right middle and lower lobe bronchi, difficult to delineate if this is due to secretions or external compression. Right middle lobe appears completely atelectatic. Moderate left pleural effusion is similar. 2 small left upper lobe pulmonary nodules peripherally measuring 5 and 6 mm, both on series 6, image 51, not seen on prior. Irregular subpleural density with ill-defined margins in the anterior left upper lobe, series 6, image 77, also new. Breathing motion artifact limits detailed assessment. There is no pneumothorax. Upper Abdomen: Upper abdominal atherosclerosis. Cholecystectomy. No acute upper abdominal findings. Musculoskeletal: No evidence of focal bone lesion. No  acute osseous abnormalities are seen. Degenerative change throughout the spine. Review of the MIP images confirms the above findings. IMPRESSION: 1. No pulmonary embolus. 2. Moderately large partially loculated right pleural effusion which has increased in size from prior exam. Progressive right perihilar consolidation and soft tissue density with narrowing of the right mainstem bronchus and complete occlusion of the right middle and lower lobe bronchi. It is unclear if bronchial occlusion is due to retained secretions/mucus or external compression. Right middle lobe is completely atelectatic. 3. Cavitary lesion in the right upper lobe with equivocal increase in size from prior exam. 4. Two small left upper lobe pulmonary nodules measuring 5 and 6 mm, not seen on prior exam, nonspecific. No ill-defined subpleural density in the anterior left upper lobe. Recommend attention at follow-up. 5. Decreased size of right lower paratracheal node. Aortic Atherosclerosis (ICD10-I70.0). Electronically Signed   By: Keith Rake M.D.   On: 05/14/2020 18:27   DG CHEST PORT 1 VIEW  Result Date: 05/15/2020 CLINICAL DATA:  Pleural effusion. EXAM: PORTABLE CHEST 1 VIEW COMPARISON:  CT 05/14/2020.  Chest x-ray 05/14/2020. FINDINGS: PowerPort catheter stable position. Heart size stable. Right mainstem bronchus may be occluded. Progressive opacification right upper hemithorax most consistent with progressive atelectasis/infiltrate and right pleural effusion. Pleural effusion may be loculated. Diffuse left lung infiltrate/edema noted on today's exam. Small left pleural effusion also noted. No pneumothorax. IMPRESSION: 1. PowerPort catheter in stable position. 2. Right mainstem bronchus may be occluded. Progressive opacification right upper hemithorax most consistent with progressive atelectasis/infiltrate and right pleural effusion. Pleural effusion may be loculated. 3. Diffuse left lung infiltrate/edema and small left pleural  effusion noted on today's exam. Electronically Signed   By: Marcello Moores  Register   On: 05/15/2020 06:51      Flora Lipps, MD  [facility] 05/16/2020  If 7PM-7AM, please contact night-coverage

## 2020-05-16 NOTE — Plan of Care (Signed)

## 2020-05-17 DIAGNOSIS — U071 COVID-19: Secondary | ICD-10-CM | POA: Diagnosis not present

## 2020-05-17 LAB — COMPREHENSIVE METABOLIC PANEL
ALT: 11 U/L (ref 0–44)
AST: 9 U/L — ABNORMAL LOW (ref 15–41)
Albumin: 2.8 g/dL — ABNORMAL LOW (ref 3.5–5.0)
Alkaline Phosphatase: 81 U/L (ref 38–126)
Anion gap: 8 (ref 5–15)
BUN: 37 mg/dL — ABNORMAL HIGH (ref 8–23)
CO2: 28 mmol/L (ref 22–32)
Calcium: 8.6 mg/dL — ABNORMAL LOW (ref 8.9–10.3)
Chloride: 98 mmol/L (ref 98–111)
Creatinine, Ser: 1.24 mg/dL (ref 0.61–1.24)
GFR, Estimated: >60 mL/min (ref >60)
Glucose, Bld: 168 mg/dL — ABNORMAL HIGH (ref 70–99)
Potassium: 3.6 mmol/L (ref 3.5–5.1)
Sodium: 134 mmol/L — ABNORMAL LOW (ref 135–145)
Total Bilirubin: 0.5 mg/dL (ref 0.3–1.2)
Total Protein: 7 g/dL (ref 6.5–8.1)

## 2020-05-17 LAB — CBC WITH DIFFERENTIAL/PLATELET
Abs Immature Granulocytes: 0.04 10*3/uL (ref 0.00–0.07)
Basophils Absolute: 0 10*3/uL (ref 0.0–0.1)
Basophils Relative: 0 %
Eosinophils Absolute: 0 10*3/uL (ref 0.0–0.5)
Eosinophils Relative: 0 %
HCT: 27.7 % — ABNORMAL LOW (ref 39.0–52.0)
Hemoglobin: 8.6 g/dL — ABNORMAL LOW (ref 13.0–17.0)
Immature Granulocytes: 0 %
Lymphocytes Relative: 2 %
Lymphs Abs: 0.2 10*3/uL — ABNORMAL LOW (ref 0.7–4.0)
MCH: 30.4 pg (ref 26.0–34.0)
MCHC: 31 g/dL (ref 30.0–36.0)
MCV: 97.9 fL (ref 80.0–100.0)
Monocytes Absolute: 0.2 10*3/uL (ref 0.1–1.0)
Monocytes Relative: 2 %
Neutro Abs: 8.9 10*3/uL — ABNORMAL HIGH (ref 1.7–7.7)
Neutrophils Relative %: 96 %
Platelets: 180 10*3/uL (ref 150–400)
RBC: 2.83 MIL/uL — ABNORMAL LOW (ref 4.22–5.81)
RDW: 15.9 % — ABNORMAL HIGH (ref 11.5–15.5)
WBC: 9.3 10*3/uL (ref 4.0–10.5)
nRBC: 0 % (ref 0.0–0.2)

## 2020-05-17 LAB — GLUCOSE, CAPILLARY
Glucose-Capillary: 196 mg/dL — ABNORMAL HIGH (ref 70–99)
Glucose-Capillary: 232 mg/dL — ABNORMAL HIGH (ref 70–99)
Glucose-Capillary: 230 mg/dL — ABNORMAL HIGH (ref 70–99)
Glucose-Capillary: 194 mg/dL — ABNORMAL HIGH (ref 70–99)

## 2020-05-17 LAB — FERRITIN: Ferritin: 987 ng/mL — ABNORMAL HIGH (ref 24–336)

## 2020-05-17 LAB — MAGNESIUM: Magnesium: 1.7 mg/dL (ref 1.7–2.4)

## 2020-05-17 LAB — FIBRINOGEN: Fibrinogen: 508 mg/dL — ABNORMAL HIGH (ref 210–475)

## 2020-05-17 LAB — D-DIMER, QUANTITATIVE: D-Dimer, Quant: 3.42 ug/mL-FEU — ABNORMAL HIGH (ref 0.00–0.50)

## 2020-05-17 LAB — C-REACTIVE PROTEIN: CRP: 2 mg/dL — ABNORMAL HIGH (ref <1.0)

## 2020-05-17 NOTE — Progress Notes (Addendum)
PROGRESS NOTE  [name] WSF:[id number] DOB: Apr 30, 1945 DOA: 05/14/2020 PCP: [name], MD  HPI/Recap of past 24 hours:  Per [facility] Johnsonis a 75 y.o.malewith medical history significant forsmall cell carcinoma of the right lung, on chemotherapy and radiation ,associated with right pleural effusion, paroxysmal atrial fibrillation not on anticoagulation, type 2 diabetes mellitus, stage IIIa chronic kidney disease with baseline creatinine 1.2-1.5, chronic anemia with baseline hemoglobin 7-9 presented to the hospital with 4-day history of shortness of breath cough generalized weakness.  There was no history of orthopnea PND.  Patient has received vaccinations for Covid.  In the ED, patient was slightly tachypneic and was noted to be hypoxic with pulse ox of 88% on room air.  Patient was given supplemental oxygen.  He also received IV Cardizem for atrial flutter.  Procalcitonin was less than 0.10.  COVID-19 PCR in the ED was positive..  Chest x-ray with unchanged partially loculated moderate/large right pleural effusion with diffuse reticular opacities in the perihilar region, also unchanged, and a small left pleural effusion, in the absence of any evidence of pneumothorax. CTA of the chest was then performed without evidence of pulmonary embolism and moderate sized known partially loculated right pleural effusion, with slight increase in size relative to that on prior CTA chest.Progressive right perihilar consolidation with occlusion of the right middle and lower lobe bronchi relative to compressive atelectasis observed in the right lower lobe on prior CTA,and also showed no interval change in right upper lobe cavitary lesion.  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  With hypoxic respiratory failure.    Completed steroids and remdesivir.  Trend inflammatory markers.  He still on 4L minute O2.  We will Wean oxygen as able.  If worsening hypoxia or inflammatory markers could initiate further treatment.  Has been started on linagliptin.  Continue flutter valve, incentive spirometry, albuterol inhaler.  COVID-19 Labs  Recent Labs (last 2 labs)       Recent Labs    05/14/20 1954 05/15/20 0452  DDIMER 3.42*  --   FERRITIN 1,120* 1,057*  LDH 133 108  CRP 6.9* 6.1*      Recent Labs       Lab Results  Component Value Date   SARSCOV2NAA POSITIVE (A) 05/14/2020   SARSCOV2NAA Not Detected 05/13/2020   [place] NEGATIVE 03/24/2020   [name] NEGATIVE 03/11/2020      Atrial fibrillation with RVR: In the ED.  Not on anticoagulation as outpatient.  Received IV Cardizem. Not on chronic anticoagulant patient in spite of CHA2DS2-VAScscore of 3.  Continue strict input output charting, daily weights. IV Lopressor as needed. Continue Cardizem.  Has improved  Generalized weakness:  Likely secondary to severe Covid 19 infection .  PT has been consulted for  evaluation   Type 2 diabetes mellitus:  hemoglobin A1c noted to be 8.8%.  On 40 units 3 times daily with meals.    Continue sliding scale insulin, long-acting insulin at this time.  Consult dietary coordinator. Patient is on IV steroids is likely going  to continue to be high  Hyperlipidemia:  Continue statins  Stage IIIa chronic kidney disease:  baseline creatinine range 1.2-1.5, at baseline at this time.  Continue intake and output charting.   Chronic anemia:   baseline hemoglobin range of 7-9.  Ferritin elevated in the context of Covid illness.,  Follow TIBC, total iron, MMA, folic acid.  Hemoglobin of 7.3 today    Severity of Illness: The appropriate patient status for this patient is INPATIENT. Inpatient status is judged to be reasonable and necessary in order to provide the required intensity of service to ensure the patient's safety. The patient's presenting symptoms, physical exam findings, and initial radiographic and laboratory data in the context of their chronic comorbidities is felt to place them at high risk for further clinical deterioration. Furthermore, it is not anticipated that the patient will be medically stable for discharge from the hospital within 2 midnights of admission. The following factors support the patient status of inpatient.   " The patient's presenting symptoms include respiratory distress hypoxia. " The worrisome physical exam findings include hypoxia respiratory distress. " The initial radiographic and laboratory data are worrisome because of hypoxia COVID-19. " The chronic co-morbidities include  lung cancer.   * I certify that at the point of admission it is my clinical judgment that the patient will require inpatient hospital care spanning beyond 2 midnights from the point of admission due to high intensity of service, high risk for further deterioration and high frequency of surveillance required.*    Family Communication: None at bedside  Disposition Plan: Home in few days   Consultants:  None  Procedures:  None  Antimicrobials:  Remdesivir  DVT prophylaxis: Lovenox SCD   Objective: Vitals:   05/16/20 1300 05/16/20 2014 05/16/20 2234 05/17/20 0447  BP: (!) 164/110 (!)  169/117 (!) 160/92 (!) 127/99  Pulse: (!) 119 82 (!) 40 (!) 52  Resp: (!) 22 18  (!) 22  Temp: 97.7 F (36.5 C) 98 F (36.7 C)  97.8 F (36.6 C)  TempSrc: Oral Oral  Oral  SpO2: 98% (!) 84% 90% 97%  Weight:      Height:        Intake/Output Summary (Last 24 hours) at 05/17/2020 0930 Last data filed at 05/17/2020 0748 Gross per 24 hour  Intake --  Output 2300 ml  Net -2300 ml   Filed Weights   05/14/20 2330 05/15/20 0500 05/16/20 0407  Weight: 85.5 kg 87.3 kg 89 kg   Body mass index is 27.37 kg/m.  Exam:   General: 75 y.o. year-old male well developed well nourished in no acute distress.  Alert and oriented x3. Currently on 4 L/min nasal cannula  Cardiovascular: Regular rate and rhythm with no rubs or gallops.  No thyromegaly or JVD noted.    Respiratory: Clear to auscultation with no wheezes or rales. Good inspiratory effort.  Abdomen: Soft nontender nondistended with normal bowel sounds x4 quadrants.  Musculoskeletal: No lower extremity edema. 2/4 pulses in all 4 extremities.  Skin: No ulcerative lesions noted or rashes,  Psychiatry: Mood is appropriate for condition and setting    Data Reviewed: CBC: Recent Labs  Lab 05/13/20 1319 05/14/20 1433 05/15/20 0452 05/16/20 0418 05/17/20  0430  WBC 4.3 4.7 3.9* 6.9 9.3  NEUTROABS 3.3  --  3.7 6.6 8.9*  HGB 7.8 Repeated and verified X2.* 8.1* 7.3* 7.7* 8.6*  HCT 23.8 Repeated and verified X2.* 25.7* 24.0* 24.7* 27.7*  MCV 92.9 97.0 99.2 96.9 97.9  PLT 174.0 167 127* 162 387   Basic Metabolic Panel: Recent Labs  Lab 05/13/20 1319 05/14/20 1433 05/14/20 1954 05/15/20 0452 05/16/20 0418 05/17/20 0430  NA 135 138  --  136 137 134*  K 4.2 4.5  --  4.0 4.0 3.6  CL 97 99  --  98 99 98  CO2 31 29  --  25 27 28   GLUCOSE 336* 425*  --  304* 364* 168*  BUN 25* 24*  --  21 34* 37*  CREATININE 1.40 1.36*  --  1.22 1.55* 1.24  CALCIUM 9.0 8.9  --  8.7* 9.0 8.6*  MG  --   --  1.5* 1.9 1.7 1.7  PHOS  --   --   --   3.9  --   --    GFR: Estimated Creatinine Clearance: 55.7 mL/min (by C-G formula based on SCr of 1.24 mg/dL). Liver Function Tests: Recent Labs  Lab 05/13/20 1319 05/14/20 1433 05/15/20 0452 05/16/20 0418 05/17/20 0430  AST 7 8* 8* 9* 9*  ALT 7 10 11 12 11   ALKPHOS 84 87 86 86 81  BILITOT 0.4 0.6 0.9 0.5 0.5  PROT 7.0 7.4 6.7 6.8 7.0  ALBUMIN 3.2* 3.0* 2.8* 2.8* 2.8*   No results for input(s): LIPASE, AMYLASE in the last 168 hours. No results for input(s): AMMONIA in the last 168 hours. Coagulation Profile: Recent Labs  Lab 05/15/20 0452  INR 1.3*   Cardiac Enzymes: No results for input(s): CKTOTAL, CKMB, CKMBINDEX, TROPONINI in the last 168 hours. BNP (last 3 results) No results for input(s): PROBNP in the last 8760 hours. HbA1C: Recent Labs    05/15/20 0452  HGBA1C 8.8*   CBG: Recent Labs  Lab 05/16/20 1128 05/16/20 1235 05/16/20 1640 05/16/20 2033 05/17/20 0723  GLUCAP 318* 269* 171* 105* 196*   Lipid Profile: No results for input(s): CHOL, HDL, LDLCALC, TRIG, CHOLHDL, LDLDIRECT in the last 72 hours. Thyroid Function Tests: Recent Labs    05/15/20 0452  TSH 1.227   Anemia Panel: Recent Labs    05/14/20 1954 05/14/20 2002 05/15/20 0452 05/16/20 0418 05/17/20 0430  FOLATE  --  9.6  --   --   --   FERRITIN 1,120*  --    < > 945* 987*  TIBC 243*  --   --   --   --   IRON 36*  --   --   --   --    < > = values in this interval not displayed.   Urine analysis:    Component Value Date/Time   COLORURINE YELLOW 05/14/2020 1710   APPEARANCEUR CLEAR 05/14/2020 1710   LABSPEC 1.033 (H) 05/14/2020 1710   PHURINE 5.0 05/14/2020 1710   GLUCOSEU >=500 (A) 05/14/2020 1710   HGBUR SMALL (A) 05/14/2020 1710   BILIRUBINUR NEGATIVE 05/14/2020 1710   BILIRUBINUR 1+ 09/10/2014 1615   KETONESUR 5 (A) 05/14/2020 1710   PROTEINUR 100 (A) 05/14/2020 1710   UROBILINOGEN 0.2 12/05/2014 2146   NITRITE NEGATIVE 05/14/2020 1710   LEUKOCYTESUR NEGATIVE 05/14/2020  1710   Sepsis Labs: @LABRCNTIP (procalcitonin:4,lacticidven:4)  ) Recent Results (from the past 240 hour(s))  Novel Coronavirus, NAA ([facility])     Status: None  Collection Time: 05/13/20 12:00 AM   Specimen: Oropharyngeal(OP) collection in vial transport medium   Oropharyngea  Result Value Ref Range Status   SARS-CoV-2, NAA Not Detected Not Detected Final    Comment: This nucleic acid amplification test was developed and its performance characteristics determined by [employer]. Nucleic acid amplification tests include RT-PCR and TMA. This test has not been FDA cleared or approved. This test has been authorized by FDA under an Emergency Use Authorization (EUA). This test is only authorized for the duration of time the declaration that circumstances exist justifying the authorization of the emergency use of in vitro diagnostic tests for detection of SARS-CoV-2 virus and/or diagnosis of COVID-19 infection under section 564(b)(1) of the Act, 21 U.S.C. 381OFB-5(Z) (1), unless the authorization is terminated or revoked sooner. When diagnostic testing is negative, the possibility of a false negative result should be considered in the context of a patient's recent exposures and the presence of clinical signs and symptoms consistent with COVID-19. An individual without symptoms of COVID-19 and who is not shedding SARS-CoV-2 virus wo uld expect to have a negative (not detected) result in this assay.   SARS-COV-2, NAA 2 DAY TAT     Status: None   Collection Time: 05/13/20 12:00 AM   Oropharyngea  Result Value Ref Range Status   SARS-CoV-2, NAA 2 DAY TAT Performed  Final  Culture, blood (routine x 2)     Status: None (Preliminary result)   Collection Time: 05/14/20  5:10 PM   Specimen: BLOOD  Result Value Ref Range Status   Specimen Description   Final    BLOOD PORTA CATH Performed at [facility] [address], [place], [place] [address]    Special  Requests   Final    BOTTLES DRAWN AEROBIC AND ANAEROBIC Blood Culture adequate volume Performed at [facility] [address] [place], [place] [address]    Culture   Final    NO GROWTH 2 DAYS Performed at [facility] [address], [place],  [address]    Report Status PENDING  Incomplete  SARS Coronavirus 2 by RT PCR (hospital order, performed in [facility] hospital lab) Nasopharyngeal Nasopharyngeal Swab     Status: Abnormal   Collection Time: 05/14/20  5:10 PM   Specimen: Nasopharyngeal Swab  Result Value Ref Range Status   SARS Coronavirus 2 POSITIVE (A) NEGATIVE Final    Comment: RESULT CALLED TO, READ BACK BY AND VERIFIED WITH: BROOKS,B. RN @1856  ON 02.02.2022 BY COHEN,K (NOTE) SARS-CoV-2 target nucleic acids are DETECTED  SARS-CoV-2 RNA is generally detectable in upper respiratory specimens  during the acute phase of infection.  Positive results are indicative  of the presence of the identified virus, but do not rule out bacterial infection or co-infection with other pathogens not detected by the test.  Clinical correlation with patient history and  other diagnostic information is necessary to determine patient infection status.  The expected result is negative.  Fact Sheet for Patients:   StrictlyIdeas.no   Fact Sheet for Healthcare Providers:   BankingDealers.co.za    This test is not yet approved or cleared by the [place] FDA and  has been authorized for detection and/or diagnosis of SARS-CoV-2 by FDA under an Emergency Use Authorization (EUA).  This EUA will remain in effect (meanin g this test can be used) for the duration of  the COVID-19 declaration under Section 564(b)(1) of the Act, 21 U.S.C. section 360-bbb-3(b)(1), unless the authorization  is terminated or revoked sooner.  Performed at [facility], [place] [address] [place], [place] [address]        Studies: No results found.  Scheduled Meds:  atorvastatin  20 mg Oral QHS   Chlorhexidine Gluconate Cloth  6 each Topical Daily   diltiazem  240 mg Oral Daily   donepezil  10 mg Oral QHS   enoxaparin (LOVENOX) injection  40 mg Subcutaneous Q24H   insulin aspart  0-20 Units Subcutaneous TID WC   insulin aspart  0-5 Units Subcutaneous QHS   insulin aspart  5 Units Subcutaneous TID WC   insulin glargine  20 Units Subcutaneous Daily   Ipratropium-Albuterol  1 puff Inhalation Q6H WA   linagliptin  5 mg Oral Daily   mouth rinse  15 mL Mouth Rinse BID   memantine  10 mg Oral BID   methylPREDNISolone (SOLU-MEDROL) injection  40 mg Intravenous Q12H   montelukast  10 mg Oral QHS    Continuous Infusions:  remdesivir 100 mg in NS 100 mL 100 mg (05/17/20 0919)     LOS: 3 days     Cristal Deer, MD [facility]  To reach me or the doctor on call, go to: [email or web address] Password [email or web address]  05/17/2020, 9:30 AM

## 2020-05-18 DIAGNOSIS — U071 COVID-19: Secondary | ICD-10-CM | POA: Diagnosis not present

## 2020-05-18 LAB — COMPREHENSIVE METABOLIC PANEL
ALT: 14 U/L (ref 0–44)
AST: 15 U/L (ref 15–41)
Albumin: 3 g/dL — ABNORMAL LOW (ref 3.5–5.0)
Alkaline Phosphatase: 76 U/L (ref 38–126)
Anion gap: 10 (ref 5–15)
BUN: 40 mg/dL — ABNORMAL HIGH (ref 8–23)
CO2: 32 mmol/L (ref 22–32)
Calcium: 8.6 mg/dL — ABNORMAL LOW (ref 8.9–10.3)
Chloride: 99 mmol/L (ref 98–111)
Creatinine, Ser: 1.17 mg/dL (ref 0.61–1.24)
GFR, Estimated: >60 mL/min (ref >60)
Glucose, Bld: 126 mg/dL — ABNORMAL HIGH (ref 70–99)
Potassium: 3.5 mmol/L (ref 3.5–5.1)
Sodium: 141 mmol/L (ref 135–145)
Total Bilirubin: 0.5 mg/dL (ref 0.3–1.2)
Total Protein: 6.8 g/dL (ref 6.5–8.1)

## 2020-05-18 LAB — CBC WITH DIFFERENTIAL/PLATELET
Abs Immature Granulocytes: 0.04 10*3/uL (ref 0.00–0.07)
Basophils Absolute: 0 10*3/uL (ref 0.0–0.1)
Basophils Relative: 0 %
Eosinophils Absolute: 0 10*3/uL (ref 0.0–0.5)
Eosinophils Relative: 0 %
HCT: 28.3 % — ABNORMAL LOW (ref 39.0–52.0)
Hemoglobin: 8.9 g/dL — ABNORMAL LOW (ref 13.0–17.0)
Immature Granulocytes: 1 %
Lymphocytes Relative: 1 %
Lymphs Abs: 0.1 10*3/uL — ABNORMAL LOW (ref 0.7–4.0)
MCH: 30.4 pg (ref 26.0–34.0)
MCHC: 31.4 g/dL (ref 30.0–36.0)
MCV: 96.6 fL (ref 80.0–100.0)
Monocytes Absolute: 0.3 10*3/uL (ref 0.1–1.0)
Monocytes Relative: 4 %
Neutro Abs: 7.9 10*3/uL — ABNORMAL HIGH (ref 1.7–7.7)
Neutrophils Relative %: 94 %
Platelets: 153 10*3/uL (ref 150–400)
RBC: 2.93 MIL/uL — ABNORMAL LOW (ref 4.22–5.81)
RDW: 16.3 % — ABNORMAL HIGH (ref 11.5–15.5)
WBC: 8.4 10*3/uL (ref 4.0–10.5)
nRBC: 0 % (ref 0.0–0.2)

## 2020-05-18 LAB — GLUCOSE, CAPILLARY
Glucose-Capillary: 133 mg/dL — ABNORMAL HIGH (ref 70–99)
Glucose-Capillary: 192 mg/dL — ABNORMAL HIGH (ref 70–99)
Glucose-Capillary: 234 mg/dL — ABNORMAL HIGH (ref 70–99)
Glucose-Capillary: 265 mg/dL — ABNORMAL HIGH (ref 70–99)

## 2020-05-18 LAB — FERRITIN: Ferritin: 1136 ng/mL — ABNORMAL HIGH (ref 24–336)

## 2020-05-18 LAB — FIBRINOGEN: Fibrinogen: 207 mg/dL — ABNORMAL LOW (ref 210–475)

## 2020-05-18 LAB — C-REACTIVE PROTEIN: CRP: 1.3 mg/dL — ABNORMAL HIGH (ref <1.0)

## 2020-05-18 LAB — MAGNESIUM: Magnesium: 1.7 mg/dL (ref 1.7–2.4)

## 2020-05-18 LAB — D-DIMER, QUANTITATIVE: D-Dimer, Quant: 2.05 ug/mL-FEU — ABNORMAL HIGH (ref 0.00–0.50)

## 2020-05-18 MED ORDER — DILTIAZEM HCL 30 MG PO TABS
30.0000 mg | ORAL_TABLET | Freq: Once | ORAL | Status: AC
  Administered 2020-05-18: 30 mg via ORAL
  Filled 2020-05-18: qty 1

## 2020-05-18 MED ORDER — IPRATROPIUM-ALBUTEROL 20-100 MCG/ACT IN AERS
1.0000 | INHALATION_SPRAY | Freq: Three times a day (TID) | RESPIRATORY_TRACT | Status: DC
  Administered 2020-05-18 – 2020-05-21 (×11): 1 via RESPIRATORY_TRACT

## 2020-05-18 MED ORDER — DILTIAZEM HCL ER COATED BEADS 120 MG PO CP24
300.0000 mg | ORAL_CAPSULE | Freq: Every day | ORAL | Status: DC
  Administered 2020-05-19 – 2020-05-21 (×3): 300 mg via ORAL
  Filled 2020-05-18 (×3): qty 1

## 2020-05-18 NOTE — Progress Notes (Signed)
PROGRESS NOTE  [name] OQH:[id number] DOB: 29-Jul-1945 DOA: 05/14/2020 PCP: [name], MD  HPI/Recap of past 24 hours:  Per TWS:FKCLE Dustin Johnsonis a 75 y.o.malewith medical history significant forsmall cell carcinoma of the right lung, on chemotherapy and radiation ,associated with right pleural effusion, paroxysmal atrial fibrillation not on anticoagulation, type 2 diabetes mellitus, stage IIIa chronic kidney disease with baseline creatinine 1.2-1.5, chronic anemia with baseline hemoglobin 7-9 presented to the hospital with 4-day history of shortness of breath cough generalized weakness.  There was no history of orthopnea PND.  Patient has received vaccinations for Covid.  In the ED, patient was slightly tachypneic and was noted to be hypoxic with pulse ox of 88% on room air.  Patient was given supplemental oxygen.  He also received IV Cardizem for atrial flutter.  Procalcitonin was less than 0.10.  COVID-19 PCR in the ED was positive..  Chest x-ray with unchanged partially loculated moderate/large right pleural effusion with diffuse reticular opacities in the perihilar region, also unchanged, and a small left pleural effusion, in the absence of any evidence of pneumothorax. CTA of the chest was then performed without evidence of pulmonary embolism and moderate sized known partially loculated right pleural effusion, with slight increase in size relative to that on prior CTA chest.Progressive right perihilar consolidation with occlusion of the right middle and lower lobe bronchi relative to compressive atelectasis observed in the right lower lobe on prior CTA,and also showed no interval change in right upper lobe cavitary lesion.  Patient was then admitted to hospital for further evaluation and treatment   subjective: May 18, 2020.: Patient seen and examined at bedside, nurse reported patient started to have tachycardia since last month and this morning he went into the 150s she  has given him all the prescribed medication for tachycardia he does have a history of atrial fibrillation.  He is not on anticoagulation.  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  With hypoxic respiratory failure.    Completed steroids and remdesivir.    Inflammatory markers are still elevated trend inflammatory markers.  He still on 4L minute O2.  We will Wean oxygen as able.  If worsening hypoxia or inflammatory markers could initiate further treatment.  Has been started on linagliptin.  Continue flutter valve, incentive spirometry, albuterol inhaler.  COVID-19 Labs  Recent Labs (last 2 labs)       Recent Labs    05/14/20 1954 05/15/20 0452  DDIMER 3.42*  --   FERRITIN 1,120* 1,057*  LDH 133 108  CRP 6.9* 6.1*      Recent Labs       Lab Results  Component Value Date   SARSCOV2NAA POSITIVE (A) 05/14/2020   SARSCOV2NAA Not Detected 05/13/2020   [place] NEGATIVE 03/24/2020   [place] NEGATIVE 03/11/2020      Atrial fibrillation with RVR: In  the ED.  Not on anticoagulation as outpatient.  Received IV Cardizem. Not on chronic anticoagulant patient in spite of CHA2DS2-VAScscore of 3.  Continue strict input output charting, daily weights. IV Lopressor as needed. Continue Cardizem.    Worsened overnight.  He  was in the 150s despite as needed metoprolol and his 240 mg of Cardizem CD.  I added immediate release Cardizem 30 mg 1 dose and I will change his Cardizem CD to 300 mg daily starting tomorrow  Generalized weakness:  Likely secondary to severe Covid 19 infection .  PT has been consulted for evaluation   Type 2 diabetes mellitus:  hemoglobin A1c noted to be 8.8%.  On 40 units 3 times daily with meals.    Continue sliding scale insulin, long-acting insulin at this time.  Consult dietary coordinator. Patient is on IV steroids is likely going to continue to be high  Hyperlipidemia:  Continue statins  Stage IIIa chronic kidney disease:  baseline creatinine range 1.2-1.5, at baseline at this time.  Continue intake and output charting.   Chronic anemia:   baseline hemoglobin range of 7-9.   Globin is 8.9 today Ferritin elevated in the context of Covid illness.,  Follow TIBC, total iron, MMA, folic acid.  Hemoglobin of 7.3 today  Retention stable continue current management  Severity of Illness: The appropriate patient status for this patient is INPATIENT. Inpatient status is judged to be reasonable and necessary in order to provide the required intensity of service to ensure the patient's safety. The patient's presenting symptoms, physical exam findings, and initial radiographic and laboratory data in the context of their chronic comorbidities is felt to place them at high risk for further clinical deterioration. Furthermore, it is not anticipated that the patient will be medically stable for discharge from the hospital within 2 midnights of admission. The following factors support the patient status of inpatient.   " The patient's presenting symptoms include respiratory distress hypoxia. " The worrisome physical exam findings include hypoxia respiratory distress. " The initial radiographic and laboratory data are worrisome because of hypoxia COVID-19. " The chronic co-morbidities include  lung  cancer.   * I certify that at the point of admission it is my clinical judgment that the patient will require inpatient hospital care spanning beyond 2 midnights from the point of admission due to high intensity of service, high risk for further deterioration and high frequency of surveillance required.*    Family Communication: None at bedside  Disposition Plan: Home in few days   Consultants:  None  Procedures:  None  Antimicrobials:  Remdesivir  DVT prophylaxis: Lovenox SCD   Objective: Vitals:   05/18/20 0500 05/18/20 0606 05/18/20 0956 05/18/20 1400  BP:  (!) 147/109 (!) 118/94 (!) 107/95  Pulse:  63 (!) 149 (!) 147  Resp:  18  20  Temp:      TempSrc:      SpO2:  97%  90%  Weight: 87.9 kg     Height:        Intake/Output Summary (Last 24 hours) at 05/18/2020 1554 Last data filed at 05/18/2020 0610 Gross per 24 hour  Intake --  Output 1275 ml  Net -1275 ml   Filed Weights   05/15/20 0500 05/16/20 0407 05/18/20 0500  Weight: 87.3 kg 89 kg 87.9 kg   Body mass index is 27.03 kg/m.  Exam:  . General: 75 y.o. year-old male well developed well nourished in no acute distress.  Alert and oriented x3. Currently  on 4 L/min nasal cannula . Cardiovascular: Regular rate and rhythm with no rubs or gallops.  No thyromegaly or JVD noted.   [name] Kitchen Respiratory: Clear to auscultation with no wheezes or rales. Good inspiratory effort. . Abdomen: Soft nontender nondistended with normal bowel sounds x4 quadrants. . Musculoskeletal: No lower extremity edema. 2/4 pulses in all 4 extremities. . Skin: No ulcerative lesions noted or rashes, . Psychiatry: Mood is appropriate for condition and setting    Data Reviewed: CBC: Recent Labs  Lab 05/13/20 1319 05/14/20 1433 05/15/20 0452 05/16/20 0418 05/17/20 0430 05/18/20 0531  WBC 4.3 4.7 3.9* 6.9 9.3 8.4  NEUTROABS 3.3  --  3.7 6.6 8.9* 7.9*  HGB 7.8 Repeated and verified X2.* 8.1* 7.3* 7.7* 8.6* 8.9*  HCT 23.8 Repeated  and verified X2.* 25.7* 24.0* 24.7* 27.7* 28.3*  MCV 92.9 97.0 99.2 96.9 97.9 96.6  PLT 174.0 167 127* 162 180 062   Basic Metabolic Panel: Recent Labs  Lab 05/14/20 1433 05/14/20 1954 05/15/20 0452 05/16/20 0418 05/17/20 0430 05/18/20 0531  NA 138  --  136 137 134* 141  K 4.5  --  4.0 4.0 3.6 3.5  CL 99  --  98 99 98 99  CO2 29  --  25 27 28  32  GLUCOSE 425*  --  304* 364* 168* 126*  BUN 24*  --  21 34* 37* 40*  CREATININE 1.36*  --  1.22 1.55* 1.24 1.17  CALCIUM 8.9  --  8.7* 9.0 8.6* 8.6*  MG  --  1.5* 1.9 1.7 1.7 1.7  PHOS  --   --  3.9  --   --   --    GFR: Estimated Creatinine Clearance: 59 mL/min (by C-G formula based on SCr of 1.17 mg/dL). Liver Function Tests: Recent Labs  Lab 05/14/20 1433 05/15/20 0452 05/16/20 0418 05/17/20 0430 05/18/20 0531  AST 8* 8* 9* 9* 15  ALT 10 11 12 11 14   ALKPHOS 87 86 86 81 76  BILITOT 0.6 0.9 0.5 0.5 0.5  PROT 7.4 6.7 6.8 7.0 6.8  ALBUMIN 3.0* 2.8* 2.8* 2.8* 3.0*   No results for input(s): LIPASE, AMYLASE in the last 168 hours. No results for input(s): AMMONIA in the last 168 hours. Coagulation Profile: Recent Labs  Lab 05/15/20 0452  INR 1.3*   Cardiac Enzymes: No results for input(s): CKTOTAL, CKMB, CKMBINDEX, TROPONINI in the last 168 hours. BNP (last 3 results) No results for input(s): PROBNP in the last 8760 hours. HbA1C: No results for input(s): HGBA1C in the last 72 hours. CBG: Recent Labs  Lab 05/17/20 1137 05/17/20 1618 05/17/20 2132 05/18/20 0817 05/18/20 1310  GLUCAP 232* 230* 194* 133* 192*   Lipid Profile: No results for input(s): CHOL, HDL, LDLCALC, TRIG, CHOLHDL, LDLDIRECT in the last 72 hours. Thyroid Function Tests: No results for input(s): TSH, T4TOTAL, FREET4, T3FREE, THYROIDAB in the last 72 hours. Anemia Panel: Recent Labs    05/17/20 0430 05/18/20 0531  FERRITIN 987* 1,136*   Urine analysis:    Component Value Date/Time   COLORURINE YELLOW 05/14/2020 1710   APPEARANCEUR  CLEAR 05/14/2020 1710   LABSPEC 1.033 (H) 05/14/2020 1710   PHURINE 5.0 05/14/2020 1710   GLUCOSEU >=500 (A) 05/14/2020 1710   HGBUR SMALL (A) 05/14/2020 1710   BILIRUBINUR NEGATIVE 05/14/2020 1710   BILIRUBINUR 1+ 09/10/2014 1615   KETONESUR 5 (A) 05/14/2020 1710   PROTEINUR 100 (A) 05/14/2020 1710   UROBILINOGEN 0.2 12/05/2014 2146   NITRITE NEGATIVE 05/14/2020 1710  LEUKOCYTESUR NEGATIVE 05/14/2020 1710   Sepsis Labs: @LABRCNTIP (procalcitonin:4,lacticidven:4)  ) Recent Results (from the past 240 hour(s))  Novel Coronavirus, NAA ([facility])     Status: None   Collection Time: 05/13/20 12:00 AM   Specimen: Oropharyngeal(OP) collection in vial transport medium   Oropharyngea  Result Value Ref Range Status   SARS-CoV-2, NAA Not Detected Not Detected Final    Comment: This nucleic acid amplification test was developed and its performance characteristics determined by [employer]. Nucleic acid amplification tests include RT-PCR and TMA. This test has not been FDA cleared or approved. This test has been authorized by FDA under an Emergency Use Authorization (EUA). This test is only authorized for the duration of time the declaration that circumstances exist justifying the authorization of the emergency use of in vitro diagnostic tests for detection of SARS-CoV-2 virus and/or diagnosis of COVID-19 infection under section 564(b)(1) of the Act, 21 U.S.C. 761YWV-3(X) (1), unless the authorization is terminated or revoked sooner. When diagnostic testing is negative, the possibility of a false negative result should be considered in the context of a patient's recent exposures and the presence of clinical signs and symptoms consistent with COVID-19. An individual without symptoms of COVID-19 and who is not shedding SARS-CoV-2 virus wo uld expect to have a negative (not detected) result in this assay.   SARS-COV-2, NAA 2 DAY TAT     Status: None   Collection Time: 05/13/20  12:00 AM   Oropharyngea  Result Value Ref Range Status   SARS-CoV-2, NAA 2 DAY TAT Performed  Final  Culture, blood (routine x 2)     Status: None (Preliminary result)   Collection Time: 05/14/20  5:10 PM   Specimen: BLOOD  Result Value Ref Range Status   Specimen Description   Final    BLOOD PORTA CATH Performed at [facility] [address], [place], [place] [address]    Special Requests   Final    BOTTLES DRAWN AEROBIC AND ANAEROBIC Blood Culture adequate volume Performed at [facility] [address], [place], [place] [address]    Culture   Final    NO GROWTH 4 DAYS Performed at [facility], [place] [address] [place],  [address]    Report Status PENDING  Incomplete  SARS Coronavirus 2 by RT PCR (hospital order, performed in [facility] hospital lab) Nasopharyngeal Nasopharyngeal Swab     Status: Abnormal   Collection Time: 05/14/20  5:10 PM   Specimen: Nasopharyngeal Swab  Result Value Ref Range Status   SARS Coronavirus 2 POSITIVE (A) NEGATIVE Final    Comment: RESULT CALLED TO, READ BACK BY AND VERIFIED WITH: BROOKS,B. RN @1856  ON 02.02.2022 BY COHEN,K (NOTE) SARS-CoV-2 target nucleic acids are DETECTED  SARS-CoV-2 RNA is generally detectable in upper respiratory specimens  during the acute phase of infection.  Positive results are indicative  of the presence of the identified virus, but do not rule out bacterial infection or co-infection with other pathogens not detected by the test.  Clinical correlation with patient history and  other diagnostic information is necessary to determine patient infection status.  The expected result is negative.  Fact Sheet for Patients:   StrictlyIdeas.no   Fact Sheet for Healthcare Providers:   BankingDealers.co.za    This test is not yet approved or cleared by the [place] FDA and  has been authorized for detection and/or  diagnosis of SARS-CoV-2 by FDA under an Emergency Use Authorization (EUA).  This EUA  will remain in effect (meanin g this test can be used) for the duration of  the COVID-19 declaration under Section 564(b)(1) of the Act, 21 U.S.C. section 360-bbb-3(b)(1), unless the authorization is terminated or revoked sooner.  Performed at [facility], [place] [address], [place], [place] [address]       Studies: No results found.  Scheduled Meds: . atorvastatin  20 mg Oral QHS  . Chlorhexidine Gluconate Cloth  6 each Topical Daily  . [START ON 05/19/2020] diltiazem  300 mg Oral Daily  . donepezil  10 mg Oral QHS  . enoxaparin (LOVENOX) injection  40 mg Subcutaneous Q24H  . insulin aspart  0-20 Units Subcutaneous TID WC  . insulin aspart  0-5 Units Subcutaneous QHS  . insulin aspart  5 Units Subcutaneous TID WC  . insulin glargine  20 Units Subcutaneous Daily  . Ipratropium-Albuterol  1 puff Inhalation TID  . linagliptin  5 mg Oral Daily  . mouth rinse  15 mL Mouth Rinse BID  . memantine  10 mg Oral BID  . methylPREDNISolone (SOLU-MEDROL) injection  40 mg Intravenous Q12H  . montelukast  10 mg Oral QHS    Continuous Infusions:    LOS: 4 days     Cristal Deer, MD [facility]  To reach me or the doctor on call, go to: [email or web address] Password [email or web address]  05/18/2020, 3:54 PM

## 2020-05-19 ENCOUNTER — Telehealth: Payer: Self-pay | Admitting: Cardiology

## 2020-05-19 ENCOUNTER — Ambulatory Visit: Payer: Medicare Other

## 2020-05-19 DIAGNOSIS — I4891 Unspecified atrial fibrillation: Secondary | ICD-10-CM

## 2020-05-19 DIAGNOSIS — U071 COVID-19: Secondary | ICD-10-CM | POA: Diagnosis not present

## 2020-05-19 DIAGNOSIS — E119 Type 2 diabetes mellitus without complications: Secondary | ICD-10-CM | POA: Diagnosis not present

## 2020-05-19 DIAGNOSIS — R531 Weakness: Secondary | ICD-10-CM | POA: Diagnosis not present

## 2020-05-19 LAB — COMPREHENSIVE METABOLIC PANEL
ALT: 31 U/L (ref 0–44)
AST: 24 U/L (ref 15–41)
Albumin: 2.7 g/dL — ABNORMAL LOW (ref 3.5–5.0)
Alkaline Phosphatase: 88 U/L (ref 38–126)
Anion gap: 11 (ref 5–15)
BUN: 41 mg/dL — ABNORMAL HIGH (ref 8–23)
CO2: 31 mmol/L (ref 22–32)
Calcium: 8.5 mg/dL — ABNORMAL LOW (ref 8.9–10.3)
Chloride: 97 mmol/L — ABNORMAL LOW (ref 98–111)
Creatinine, Ser: 1.12 mg/dL (ref 0.61–1.24)
GFR, Estimated: >60 mL/min (ref >60)
Glucose, Bld: 110 mg/dL — ABNORMAL HIGH (ref 70–99)
Potassium: 3.2 mmol/L — ABNORMAL LOW (ref 3.5–5.1)
Sodium: 139 mmol/L (ref 135–145)
Total Bilirubin: 0.6 mg/dL (ref 0.3–1.2)
Total Protein: 6.2 g/dL — ABNORMAL LOW (ref 6.5–8.1)

## 2020-05-19 LAB — GLUCOSE, CAPILLARY
Glucose-Capillary: 97 mg/dL (ref 70–99)
Glucose-Capillary: 153 mg/dL — ABNORMAL HIGH (ref 70–99)
Glucose-Capillary: 177 mg/dL — ABNORMAL HIGH (ref 70–99)
Glucose-Capillary: 171 mg/dL — ABNORMAL HIGH (ref 70–99)

## 2020-05-19 LAB — CULTURE, BLOOD (ROUTINE X 2)
Culture: NO GROWTH
Special Requests: ADEQUATE

## 2020-05-19 LAB — CBC WITH DIFFERENTIAL/PLATELET
Abs Immature Granulocytes: 0.05 10*3/uL (ref 0.00–0.07)
Basophils Absolute: 0 10*3/uL (ref 0.0–0.1)
Basophils Relative: 0 %
Eosinophils Absolute: 0 10*3/uL (ref 0.0–0.5)
Eosinophils Relative: 0 %
HCT: 28.5 % — ABNORMAL LOW (ref 39.0–52.0)
Hemoglobin: 8.9 g/dL — ABNORMAL LOW (ref 13.0–17.0)
Immature Granulocytes: 1 %
Lymphocytes Relative: 1 %
Lymphs Abs: 0.1 10*3/uL — ABNORMAL LOW (ref 0.7–4.0)
MCH: 30.4 pg (ref 26.0–34.0)
MCHC: 31.2 g/dL (ref 30.0–36.0)
MCV: 97.3 fL (ref 80.0–100.0)
Monocytes Absolute: 0.4 10*3/uL (ref 0.1–1.0)
Monocytes Relative: 4 %
Neutro Abs: 8 10*3/uL — ABNORMAL HIGH (ref 1.7–7.7)
Neutrophils Relative %: 94 %
Platelets: 147 10*3/uL — ABNORMAL LOW (ref 150–400)
RBC: 2.93 MIL/uL — ABNORMAL LOW (ref 4.22–5.81)
RDW: 15.9 % — ABNORMAL HIGH (ref 11.5–15.5)
WBC: 8.5 10*3/uL (ref 4.0–10.5)
nRBC: 0 % (ref 0.0–0.2)

## 2020-05-19 LAB — FIBRINOGEN: Fibrinogen: 424 mg/dL (ref 210–475)

## 2020-05-19 LAB — D-DIMER, QUANTITATIVE: D-Dimer, Quant: 2.85 ug/mL-FEU — ABNORMAL HIGH (ref 0.00–0.50)

## 2020-05-19 LAB — FERRITIN: Ferritin: 1054 ng/mL — ABNORMAL HIGH (ref 24–336)

## 2020-05-19 LAB — C-REACTIVE PROTEIN: CRP: 1.1 mg/dL — ABNORMAL HIGH (ref <1.0)

## 2020-05-19 LAB — MAGNESIUM: Magnesium: 1.6 mg/dL — ABNORMAL LOW (ref 1.7–2.4)

## 2020-05-19 MED ORDER — HEPARIN (PORCINE) 25000 UT/250ML-% IV SOLN
1450.0000 [IU]/h | INTRAVENOUS | Status: DC
  Administered 2020-05-19 – 2020-05-21 (×3): 1300 [IU]/h via INTRAVENOUS
  Filled 2020-05-19 (×3): qty 250

## 2020-05-19 MED ORDER — DOXYLAMINE SUCCINATE (SLEEP) 25 MG PO TABS
25.0000 mg | ORAL_TABLET | Freq: Every evening | ORAL | Status: DC | PRN
  Administered 2020-05-19 – 2020-05-20 (×2): 25 mg via ORAL
  Filled 2020-05-19 (×4): qty 1

## 2020-05-19 MED ORDER — MAGNESIUM SULFATE 2 GM/50ML IV SOLN
2.0000 g | Freq: Once | INTRAVENOUS | Status: AC
  Administered 2020-05-19: 2 g via INTRAVENOUS
  Filled 2020-05-19: qty 50

## 2020-05-19 MED ORDER — MAGNESIUM OXIDE 400 (241.3 MG) MG PO TABS
400.0000 mg | ORAL_TABLET | Freq: Two times a day (BID) | ORAL | Status: DC
  Administered 2020-05-19 – 2020-05-21 (×5): 400 mg via ORAL
  Filled 2020-05-19 (×5): qty 1

## 2020-05-19 MED ORDER — POTASSIUM CHLORIDE CRYS ER 20 MEQ PO TBCR
40.0000 meq | EXTENDED_RELEASE_TABLET | Freq: Two times a day (BID) | ORAL | Status: AC
  Administered 2020-05-19 – 2020-05-20 (×4): 40 meq via ORAL
  Filled 2020-05-19 (×4): qty 2

## 2020-05-19 MED ORDER — HEPARIN BOLUS VIA INFUSION
4000.0000 [IU] | Freq: Once | INTRAVENOUS | Status: AC
  Administered 2020-05-19: 4000 [IU] via INTRAVENOUS
  Filled 2020-05-19: qty 4000

## 2020-05-19 NOTE — Telephone Encounter (Signed)
Spoke with the patient who is in the hospital. He states that he has not been seen by cardiology and was wondering if he needed to be seen. I advised him that they are taking care of him at the hospital and he is being seen by who he needs to. If they think he needs a cardiology consult then they will get one for him. Reassured patient that he is in good hands and is being taken care of. Patient verbalized understanding.

## 2020-05-19 NOTE — Progress Notes (Signed)
I called pt on the phone due to contact precautions. Pt states he is getting along pretty good today. He asked for prayers. The chaplain offered caring and supportive presence and listening ear. I offered prayers and blessings. Further visits will be offered.

## 2020-05-19 NOTE — Consult Note (Signed)
Cardiology Consultation:   Patient ID: JAD JOHANSSON; [id number]; 1946/03/16   Admit date: 05/14/2020 Date of Consult: 05/19/2020  Primary Care Provider: [name], MD Primary Cardiologist: Dr. Fransico Him, MD   Patient Profile:   [name] is a 75 y.o. male with a hx of non-obstructive CAD, HTN, DM type 2, CKD stage 2, SCLC s/p XRT and recently dx atrial fibrillation/atrial flutter not on AC due to hx of thrombocytopenia who is being seen today for the evaluation of atrial fibrillation with RVR at the request of Dr. Kyung Bacca.  History of Present Illness:   [name] is a 75 year old male with a history as stated above who presented to [facility] 05/14/2020 with a 4-day history of progressive shortness of breath and generalized weakness. In the ED, he was tachypneic and hypoxic with an SpO2 at 88% on room air.  He was given supplemental O2 and received IV diltiazem for atrial flutter with RVR. Unfortunately, his COVID-19 PCR came back positive. Chest CTA without evidence of PE and moderate sized known partially loculated right pleural effusion with slight increase in size relative to prior CTA assessment.  He has been recently seen by cardiology during a hospitalization 03/12/2020 for the evaluation of new onset atrial flutter.  At initial diagnosis atrial flutter was incidentally found after patient was admitted with symptomatic anemia with a hemoglobin at 6.7. He was initially treated with repeated doses of IV metoprolol without HR response therefore he was transitioned to IV diltiazem. Treatment was complicated secondary to thrombocytopenia/pancytopenia from ongoing chemotherapy which was limiting management options. He ultimately required multiple blood transfusions. He was seen by oncology who subsequently did not recommend anticoagulation until platelet count greater than 50,000 therefore Emelle was deferred. At cardiology sign off on 03/14/20, he was transitioned to Toprol XL 75 mg daily due  to persistently elevated rates.  Unfortunately, he was seen once again 03/25/2020 for recurrent atrial flutter during a hospitalization and was treated with IV diltiazem with relative rate control. Given his recent history of anemia and thrombocytopenia in the setting of chemotherapy as above, plan was not to pursue DCCV due to unclear candidacy for long-term anticoagulation. At sign off 03/26/2020 plan was for diltiazem CD 240 mg daily with close cardiology follow-up.  He has not been seen since this time.  [name] was remotely admitted to cardiology in 2013 for chest pain where he underwent a cardiac catheterization which showed mild nonobstructive CAD with a 20% RCA stenosis.  He had an echocardiogram in 2016 which showed an LVEF at 60 to 65%, normal LV diastolic function, no regional wall motion abnormalities, normal LA size and no significant valvular abnormalities.  Past Medical History:  Diagnosis Date  . Allergic rhinitis due to pollen   . Chronic kidney disease, stage III (moderate) ([place]) 12/19/2012  . Depression   . Diabetes mellitus ([place])   . Diverticulosis   . Erectile dysfunction associated with type 2 diabetes mellitus ([place]) 05/28/2015  . Former very heavy cigarette smoker (more than 40 per day) 10/07/2014  . GERD (gastroesophageal reflux disease)   . Gout   . Hyperlipidemia   . Hypertension   . Hypogonadism male 06/14/2012  . Memory loss   . Osteoarthritis   . Personal history of colonic polyps    1996  . Small cell carcinoma of right lung ([place]) 10/23/2019    Past Surgical History:  Procedure Laterality Date  . BRONCHIAL NEEDLE ASPIRATION BIOPSY  10/17/2019   Procedure: BRONCHIAL NEEDLE  ASPIRATION BIOPSIES;  Surgeon: Juanito Doom, MD;  Location: [facility];  Service: Cardiopulmonary;;  . CARDIAC CATHETERIZATION  11/2011   [facility]  . CHOLECYSTECTOMY  1980  . ENDOBRONCHIAL ULTRASOUND Bilateral 10/17/2019   Procedure: ENDOBRONCHIAL ULTRASOUND;  Surgeon: Juanito Doom, MD;  Location: [facility];  Service: Cardiopulmonary;  Laterality: Bilateral;  . ESOPHAGOGASTRODUODENOSCOPY (EGD) WITH PROPOFOL N/A 09/27/2017   Procedure: ESOPHAGOGASTRODUODENOSCOPY (EGD) WITH PROPOFOL;  Surgeon: Lin Landsman, MD;  Location: [place];  Service: Gastroenterology;  Laterality: N/A;  . EYE SURGERY Right 07/29/2016   cataract - Dr. Manuella Ghazi  . EYE SURGERY Left 08/23/2106   cataract - Dr. Manuella Ghazi  . HEMOSTASIS CONTROL  10/17/2019   Procedure: HEMOSTASIS CONTROL;  Surgeon: Juanito Doom, MD;  Location: [facility];  Service: Cardiopulmonary;;  . IR IMAGING GUIDED PORT INSERTION  11/30/2019  . IRRIGATION AND DEBRIDEMENT ABSCESS Left 11/23/2019   Procedure: IRRIGATION AND DEBRIDEMENT ABSCESS;  Surgeon: Melina Schools, MD;  Location: [facility];  Service: Orthopedics;  Laterality: Left;  . TOTAL HIP ARTHROPLASTY  2009   Dr. Gladstone Lighter  . TOTAL HIP ARTHROPLASTY       Prior to Admission medications   Medication Sig Start Date End Date Taking? Authorizing Provider  acetaminophen (TYLENOL) 650 MG CR tablet Take 650 mg by mouth every 8 (eight) hours as needed for pain.   Yes [provider]  albuterol (VENTOLIN HFA) 108 (90 Base) MCG/ACT inhaler Inhale 1-2 puffs into the lungs every 6 (six) hours as needed for wheezing or shortness of breath. 02/15/20  Yes Tanner, Lyndon Code., [name]  Ascorbic Acid (VITAMIN C) 1000 MG tablet Take 1,000 mg by mouth daily.   Yes [provider]  cefdinir (OMNICEF) 300 MG capsule Take 2 capsules (600 mg total) by mouth daily for 10 days. 05/14/20 05/24/20 Yes Copland, Frederico Hamman, MD  cholecalciferol (VITAMIN D) 1000 UNITS tablet Take 2,000 Units by mouth daily.   Yes [provider]  diltiazem (CARDIZEM CD) 240 MG 24 hr capsule Take 1 capsule (240 mg total) by mouth daily. 03/28/20  Yes Dessa Phi, DO  donepezil (ARICEPT) 10 MG tablet TAKE 1 TABLET BY MOUTH AT BEDTIME 08/24/19  Yes Copland, Frederico Hamman, MD  fluticasone (FLONASE) 50 MCG/ACT  nasal spray Place 1 spray into both nostrils daily as needed for allergies.   Yes [provider]  guaiFENesin (MUCINEX) 600 MG 12 hr tablet Take 600 mg by mouth daily as needed for cough.   Yes [provider]  insulin regular human CONCENTRATED (HUMULIN R U-500 KWIKPEN) 500 UNIT/ML kwikpen Inject 20 Units into the skin with breakfast, with lunch, and with evening meal.   Yes [provider]  memantine (NAMENDA) 10 MG tablet TAKE 1 TABLET BY MOUTH TWICE A DAY 09/20/19  Yes Copland, Frederico Hamman, MD  montelukast (SINGULAIR) 10 MG tablet Take 1 tablet (10 mg total) by mouth at bedtime. 09/20/19  Yes Bedsole, Amy E, MD  pantoprazole (PROTONIX) 20 MG tablet Take 1 tablet (20 mg total) by mouth daily. 01/21/20  Yes Heilingoetter, Cassandra L, [name]  simvastatin (ZOCOR) 40 MG tablet TAKE 1 TABLET BY MOUTH AT BEDTIME 04/30/19  Yes Copland, Frederico Hamman, MD  venlafaxine XR (EFFEXOR-XR) 75 MG 24 hr capsule TAKE 1 CAPSULE BY MOUTH EVERY MORNING AND 2 CAPSULES AT BEDTIME Patient taking differently: Take 75-150 mg by mouth in the morning and at bedtime. TAKE 1 CAPSULE BY MOUTH EVERY MORNING AND 2 CAPSULES AT BEDTIME 11/26/19  Yes Copland, Frederico Hamman, MD  Hyprom-Naphaz-Polysorb-Zn Sulf (CLEAR  EYES COMPLETE OP) Place 1 drop into both eyes daily as needed (dry eyes).    [provider]  Insulin Pen Needle (PEN NEEDLES) 31G X 5 MM MISC 1 each by Does not apply route 3 (three) times daily. To inject insulin E11.42 09/20/19   Philemon Kingdom, MD  predniSONE (DELTASONE) 10 MG tablet 2 tabs po for 3 days, then 1 tablet po for 5 days 05/14/20   Copland, Frederico Hamman, MD  prochlorperazine (COMPAZINE) 10 MG tablet Take 1 tablet (10 mg total) by mouth every 6 (six) hours as needed. Patient not taking: Reported on 05/14/2020 10/23/19   Heilingoetter, Cassandra L, [name]    Inpatient Medications: Scheduled Meds: . atorvastatin  20 mg Oral QHS  . Chlorhexidine Gluconate Cloth  6 each Topical Daily  . diltiazem  300  mg Oral Daily  . donepezil  10 mg Oral QHS  . enoxaparin (LOVENOX) injection  40 mg Subcutaneous Q24H  . insulin aspart  0-20 Units Subcutaneous TID WC  . insulin aspart  0-5 Units Subcutaneous QHS  . insulin aspart  5 Units Subcutaneous TID WC  . insulin glargine  20 Units Subcutaneous Daily  . Ipratropium-Albuterol  1 puff Inhalation TID  . linagliptin  5 mg Oral Daily  . mouth rinse  15 mL Mouth Rinse BID  . memantine  10 mg Oral BID  . methylPREDNISolone (SOLU-MEDROL) injection  40 mg Intravenous Q12H  . montelukast  10 mg Oral QHS   Continuous Infusions:  PRN Meds: acetaminophen **OR** acetaminophen, albuterol, guaiFENesin, hydrALAZINE, metoprolol tartrate, sodium chloride flush  Allergies:    Allergies  Allergen Reactions  . Tetracycline Itching and Rash    Social History:   Social History   Socioeconomic History  . Marital status: Widowed    Spouse name: Not on file  . Number of children: 4  . Years of education: 12th  . Highest education level: Not on file  Occupational History  . Occupation: Counsellor  . Occupation: retired Magazine features editor: [employer]  Tobacco Use  . Smoking status: Former Smoker    Packs/day: 3.00    Years: 35.00    Pack years: 105.00    Types: Cigarettes    Quit date: 04/12/2000    Years since quitting: 20.1  . Smokeless tobacco: Never Used  Vaping Use  . Vaping Use: Never used  Substance and Sexual Activity  . Alcohol use: No  . Drug use: No  . Sexual activity: Never  Other Topics Concern  . Not on file  Social History Narrative   No regular exercise   Caffeine use: yes, dt coke   Lives at home with his mother lives with him.   Wife Fraser Din recently deceased   Right-handed.   Social Determinants of Health   Financial Resource Strain: Low Risk   . Difficulty of Paying Living Expenses: Not hard at all  Food Insecurity: No Food Insecurity  . Worried About Charity fundraiser in the Last Year: Never true  . Ran Out  of Food in the Last Year: Never true  Transportation Needs: No Transportation Needs  . Lack of Transportation (Medical): No  . Lack of Transportation (Non-Medical): No  Physical Activity: Inactive  . Days of Exercise per Week: 0 days  . Minutes of Exercise per Session: 0 min  Stress: No Stress Concern Present  . Feeling of Stress : Not at all  Social Connections: Not on file  Intimate Partner Violence: Not At  Risk  . Fear of Current or Ex-Partner: No  . Emotionally Abused: No  . Physically Abused: No  . Sexually Abused: No    Family History:   Family History  Problem Relation Age of Onset  . Diabetes Mother   . Alzheimer's disease Mother   . Diabetes Father   . Lung cancer Father        Age 5  . Stroke Father   . Heart attack Father   . Lung cancer Sister        lung  . Heart disease Maternal Grandfather   . COPD Sister   . Heart attack Sister   . Heart disease Sister    Family Status:  Family Status  Relation Name Status  . Mother  Alive  . Father  Deceased  . Sister  (Not Specified)  . MGF  (Not Specified)  . Sister  (Not Specified)  . Sister  (Not Specified)  . Sister  (Not Specified)    ROS:  Please see the history of present illness.  All other ROS reviewed and negative.     Physical Exam/Data:   Vitals:   05/18/20 1400 05/18/20 2125 05/19/20 0429 05/19/20 1141  BP: (!) 107/95 (!) 138/97 (!) 122/97 (!) 116/94  Pulse: 89 (!) 144 90 (!) 150  Resp: 20 16 18 18   Temp:  98.1 F (36.7 C) 97.9 F (36.6 C)   TempSrc:  Oral Oral   SpO2: 90% 94% 100% (!) 70%  Weight:      Height:        Intake/Output Summary (Last 24 hours) at 05/19/2020 1218 Last data filed at 05/19/2020 1100 Gross per 24 hour  Intake --  Output 200 ml  Net -200 ml   Filed Weights   05/15/20 0500 05/16/20 0407 05/18/20 0500  Weight: 87.3 kg 89 kg 87.9 kg   Body mass index is 27.03 kg/m.   General:   in no acute distress Neck: no JVD Cardiac:  Tachycardic, irregular no murmur   Lungs:  Diminished breath sounds Abd: soft, nontender Ext: no edema Musculoskeletal:  No deformities Skin: no rash Neuro:  no focal abnormalities noted Psych:  Normal affect   EKG:  The EKG was personally reviewed and demonstrates:  05/14/20 atrial fibrillation with HR 125bpm  Telemetry:  Telemetry was personally reviewed and demonstrates:  AFL with variable conduction, 110s-150s, currently in 110s.  Relevant CV Studies:  Echocardiogram 2016: - Left ventricle: The cavity size was normal. Systolic function was  normal. The estimated ejection fraction was in the range of 60%  to 65%. Wall motion was normal; there were no regional wall  motion abnormalities. Left ventricular diastolic function  parameters were normal.  - Left atrium: The atrium was normal in size.  - Right ventricle: Systolic function was grossly normal.  - Pulmonary arteries: Systolic pressure was within the normal  range.   Impressions:   - Challenging image quality secondary to body habitus. Grossly a  normal study.   Laboratory Data:  Chemistry Recent Labs  Lab 05/17/20 0430 05/18/20 0531 05/19/20 0502  NA 134* 141 139  K 3.6 3.5 3.2*  CL 98 99 97*  CO2 28 32 31  GLUCOSE 168* 126* 110*  BUN 37* 40* 41*  CREATININE 1.24 1.17 1.12  CALCIUM 8.6* 8.6* 8.5*  GFRNONAA >60 >60 >60  ANIONGAP 8 10 11     Total Protein  Date Value Ref Range Status  05/19/2020 6.2 (L) 6.5 - 8.1 g/dL Final  Albumin  Date Value Ref Range Status  05/19/2020 2.7 (L) 3.5 - 5.0 g/dL Final   AST  Date Value Ref Range Status  05/19/2020 24 15 - 41 U/L Final  04/07/2020 13 (L) 15 - 41 U/L Final   ALT  Date Value Ref Range Status  05/19/2020 31 0 - 44 U/L Final  04/07/2020 16 0 - 44 U/L Final   Alkaline Phosphatase  Date Value Ref Range Status  05/19/2020 88 38 - 126 U/L Final   Total Bilirubin  Date Value Ref Range Status  05/19/2020 0.6 0.3 - 1.2 mg/dL Final  04/07/2020 0.5 0.3 - 1.2 mg/dL Final    Hematology Recent Labs  Lab 05/17/20 0430 05/18/20 0531 05/19/20 0502  WBC 9.3 8.4 8.5  RBC 2.83* 2.93* 2.93*  HGB 8.6* 8.9* 8.9*  HCT 27.7* 28.3* 28.5*  MCV 97.9 96.6 97.3  MCH 30.4 30.4 30.4  MCHC 31.0 31.4 31.2  RDW 15.9* 16.3* 15.9*  PLT 180 153 147*   Cardiac EnzymesNo results for input(s): TROPONINI in the last 168 hours. No results for input(s): TROPIPOC in the last 168 hours.  BNPNo results for input(s): BNP, PROBNP in the last 168 hours.  DDimer  Recent Labs  Lab 05/17/20 0430 05/18/20 0531 05/19/20 0502  DDIMER 3.42* 2.05* 2.85*   TSH:  Lab Results  Component Value Date   TSH 1.227 05/15/2020   Lipids: Lab Results  Component Value Date   CHOL 107 03/25/2020   HDL 31 (L) 03/25/2020   LDLCALC 61 03/25/2020   LDLDIRECT 82.0 01/03/2019   TRIG 73 03/25/2020   CHOLHDL 3.5 03/25/2020   HgbA1c: Lab Results  Component Value Date   HGBA1C 8.8 (H) 05/15/2020    Radiology/Studies:  No results found.  Assessment and Plan:   1. Paroxsymal atrial flutter/fibrillation with RVR: Patient was initially diagnosed with atrial flutter 03/12/2020 after hospitalization for symptomatic anemia.  He was initially treated with IV diltiazem which was ultimately transitioned to Toprol XL 75 mg daily.  Unfortunately, he was seen several weeks later during the hospitalization for recurrent atrial flutter at which time diltiazem CD 240 mg daily was added to his regimen after heart rate stabilization.  He has a history of small cell lung CA s/p XRT and chemotherapy with a history of symptomatic anemia and thrombocytopenia which excluded him from long-term anticoagulation.  DCCV was not felt to be an option on last hospitalization due to unclear candidacy for anticoagulation therapy. Unfortunately, he presented to [facility]2020 with a 4-day history of progressive shortness of breath and generalized weakness.  In the ED, he was tachypneic and hypoxic with an SPO2 at 88% on room air.  He  was given supplemental O2 and received IV diltiazem for atrial flutter with RVR.  Unfortunately, his COVID-19 PCR came back positive. Chest CTA without evidence of PE and moderate sized known partially loculated right pleural effusion with slight increase in size relative to prior CTA assessment. -Heart rates were initially somewhat controlled with IV diltiazem pushes which was then transitioned to PTA diltiazem CD 240 mg.  Due to persistently elevated rates, this was transitioned to diltiazem CD 300 mg p.o. daily .  -Will add metoprolol 25mg  PO BID and follow response -CHA2DS2-VASc Score = 4 [CHF History: 0, HTN History: 1, Diabetes History: 1, Stroke History: 0, Vascular Disease History: 1, Age Score: 1, Gender Score: 0]. Therefore, the patient's annual risk of stroke is 4.8 %.  Given he has completed chemotherapy and his counts seem  to have stabilized, recommend starting anticoagulation with heparin gtt  2.  COVID-19 with acute hypoxic respiratory failure: -Patient presented to the ED with shortness of breath found to be COVID-19 PCR positive on 05/16/2019.  He has since been treated with IV Solu-Medrol and remdesivir per internal medicine.  He is maintaining adequate O2 saturations on 3 L nasal cannula.  Inflammatory markers trending down -Management per primary team  3. Non-obstructive CAD:  -Per [facility] in 2013 after presenting with chest pain found to have a 20% RCA stenosis.  No recent anginal symptoms.  EKG without ischemic change  -Continue statin  4. HTN:  -Stable, 116/94>>122/97>>138/97 -Continue current regimen   5. HLD:  -Last LDL 80 05/21/2018 with LDL goal <70 given CAD  -Continue statin   6.  CKD stage II: -Creatinine base, 1.12 today  -Monitor Baseline appears to be in the 1.2-1.5 range   For questions or updates, please contact [place] Please consult [email or web address] for contact info under Cardiology/STEMI.   Signed, Kathyrn Drown NP-C [facility] Pager:  [phone] 05/19/2020 12:18 PM  Donato Heinz, MD

## 2020-05-19 NOTE — Progress Notes (Addendum)
PROGRESS NOTE  [name] HWE:[id number] DOB: [birth year] DOA: 05/14/2020 PCP: [name], MD   LOS: 5 days   Brief narrative:  [name] is a 75 y.o. male with medical history significant for small cell carcinoma of the right lung, on chemotherapy and radiation ,associated with right pleural effusion, paroxysmal atrial fibrillation not on anticoagulation, type 2 diabetes mellitus, stage IIIa chronic kidney disease with baseline creatinine 1.2-1.5, chronic anemia with baseline hemoglobin 7-9  presented to the hospital with 4-day history of shortness of breath cough generalized weakness.  There was no history of orthopnea PND.  Patient has received vaccinations for Covid.  In the ED, patient was slightly tachypneic and was noted to be hypoxic with pulse ox of 88% on room air.  Patient was given supplemental oxygen.  He also received IV Cardizem for atrial flutter.  Procalcitonin was less than 0.10.  COVID-19 PCR in the ED was positive..  Chest x-ray with unchanged partially loculated moderate/large right pleural effusion with diffuse reticular opacities in the perihilar region, also unchanged, and a small left pleural effusion, in the absence of any evidence of pneumothorax.  CTA of the chest was then performed without evidence of pulmonary embolism and moderate sized known partially loculated right pleural effusion, with slight increase in size relative to that on prior CTA chest.  Progressive right perihilar consolidation with occlusion of the right middle and lower lobe bronchi relative to compressive atelectasis observed in the right lower lobe on prior CTA, and also showed no interval change in right upper lobe cavitary lesion.   Patient was then admitted to hospital for further evaluation and treatment.  After hospitalization, patient's breathing status has overall stabilized, currently on 3 L of oxygen by nasal cannula, but his heart rate has been uncontrolled.  Cardiology will be  consulted. Patient stated to me that they were planning for elective cardioversion at some point.    Assessment/Plan:  Principal Problem:   COVID-19 virus infection Active Problems:   Atrial fibrillation with RVR (HCC)   SOB (shortness of breath)   Generalized weakness   Pleural effusion, right   DM2 (diabetes mellitus, type 2) ([place])   Hyperlipidemia   Severe COVID-19 infection with acute hypoxic respiratory failure: COVID-19 PCR on 03/24/2020.  Continue IV Solu-Medrol.  Patient has completed IV remdesivir.  Currently on 3 L of oxygen by nasal cannula.  Wean as able.  Inflammatory markers are trending down overall, continue flutter valve, incentive spirometry, albuterol inhaler.  COVID-19 Labs  Recent Labs    05/17/20 0430 05/18/20 0531 05/19/20 0502  DDIMER 3.42* 2.05* 2.85*  FERRITIN 987* 1,136* 1,054*  CRP 2.0* 1.3* 1.1*    Lab Results  Component Value Date   SARSCOV2NAA POSITIVE (A) 05/14/2020   SARSCOV2NAA Not Detected 05/13/2020   SARSCOV2NAA NEGATIVE 03/24/2020   [place] NEGATIVE 03/11/2020    Atrial fibrillation with RVR: On  IV metoprolol, p.o. Cardizem.    Will get cardiology consultation today.  Not on anticoagulation as outpatient. CHA2DS2-VASc score of 3.  Generalized weakness:  Likely secondary to severe COVID-19 infection, TSH within normal limits.  Patient will need home PT on discharge  Type 2 diabetes mellitus:  Hemoglobin A1c of 8.8%.  Continue insulin regimen including Lantus mealtime insulin and sliding scale insulin at this time.  Diabetic coordinator on board.  Continue linagliptin, was initiated during this admission.  Hyperlipidemia: Continue Lipitor LFTs within normal limits  Stage IIIa chronic kidney disease:  baseline creatinine range 1.2-1.5, at baseline at  this time.  Continue to monitor closely.  Creatinine of 1.1 today.  Mild hypokalemia.  Will replace orally.  Check levels in a.m.  Mild hypomagnesemia.  Will replenish IV  magnesium sulfate and add oral as well.  Check levels in a.m.  Anemia of chronic disease.  baseline hemoglobin range of 7-9.  Ferritin elevated in the context of Covid illness.  Folate level within normal limits TIBC and saturation low. Hemoglobin of 8.9 today  DVT prophylaxis: enoxaparin (LOVENOX) injection 40 mg Start: 05/14/20 2200 SCDs Start: 05/14/20 1950   Code Status: Full code  Family Communication:  I  spoke with the patient's grandson on the phone Mr Kevan Ny and updated him about the clinical condition of the patient.  Status is: Inpatient  Remains inpatient appropriate because:IV treatments appropriate due to intensity of illness or inability to take PO and Inpatient level of care appropriate due to severity of illness, acute hypoxic respiratory failure, A. fib with RVR requiring cardiology consultation  Dispo: The patient is from: Home              Anticipated d/c is to: Home              Anticipated d/c date is: 2 days              Patient currently is not medically stable to d/c.   Difficult to place patient No  Consultants:  Cardiology  Procedures:  None  Anti-infectives:  . Remdesivir 2/2>2/6  Subjective: Today, patient was seen and examined at bedside.  Wishes to go home.  Denies any chest pain, dizziness, lightheadedness fever chills.  Has mild cough.  Complains of mild leg swelling.  Objective: Vitals:   05/19/20 0429 05/19/20 1141  BP: (!) 122/97 (!) 116/94  Pulse: 90 (!) 150  Resp: 18 18  Temp: 97.9 F (36.6 C)   SpO2: 100% (!) 70%    Intake/Output Summary (Last 24 hours) at 05/19/2020 1221 Last data filed at 05/19/2020 1100 Gross per 24 hour  Intake --  Output 200 ml  Net -200 ml   Filed Weights   05/15/20 0500 05/16/20 0407 05/18/20 0500  Weight: 87.3 kg 89 kg 87.9 kg   Body mass index is 27.03 kg/m.   Physical Exam: General:  Average built, not in obvious distress nasal cannula oxygen 3 L/min HENT:   No scleral pallor or icterus  noted. Oral mucosa is moist.  Chest:   Diminished breath sounds bilaterally. No crackles or wheezes.  CVS: S1 &S2 heard. No murmur.  Irregularly irregular rhythm.  Tachycardia.  Chest wall port in place. Abdomen: Soft, nontender, nondistended.  Bowel sounds are heard.  External urinary catheter in place. Extremities: No cyanosis, clubbing but trace pedal edema noted peripheral pulses are palpable. Psych: Alert, awake and oriented, normal mood CNS:  No cranial nerve deficits.  Power equal in all extremities.   Skin: Warm and dry.  No rashes noted..  Data Review: I have personally reviewed the following laboratory data and studies,  CBC: Recent Labs  Lab 05/15/20 0452 05/16/20 0418 05/17/20 0430 05/18/20 0531 05/19/20 0502  WBC 3.9* 6.9 9.3 8.4 8.5  NEUTROABS 3.7 6.6 8.9* 7.9* 8.0*  HGB 7.3* 7.7* 8.6* 8.9* 8.9*  HCT 24.0* 24.7* 27.7* 28.3* 28.5*  MCV 99.2 96.9 97.9 96.6 97.3  PLT 127* 162 180 153 081*   Basic Metabolic Panel: Recent Labs  Lab 05/15/20 0452 05/16/20 0418 05/17/20 0430 05/18/20 0531 05/19/20 0502  NA 136 137 134* 141 139  K 4.0 4.0 3.6 3.5 3.2*  CL 98 99 98 99 97*  CO2 25 27 28  32 31  GLUCOSE 304* 364* 168* 126* 110*  BUN 21 34* 37* 40* 41*  CREATININE 1.22 1.55* 1.24 1.17 1.12  CALCIUM 8.7* 9.0 8.6* 8.6* 8.5*  MG 1.9 1.7 1.7 1.7 1.6*  PHOS 3.9  --   --   --   --    Liver Function Tests: Recent Labs  Lab 05/15/20 0452 05/16/20 0418 05/17/20 0430 05/18/20 0531 05/19/20 0502  AST 8* 9* 9* 15 24  ALT 11 12 11 14 31   ALKPHOS 86 86 81 76 88  BILITOT 0.9 0.5 0.5 0.5 0.6  PROT 6.7 6.8 7.0 6.8 6.2*  ALBUMIN 2.8* 2.8* 2.8* 3.0* 2.7*   No results for input(s): LIPASE, AMYLASE in the last 168 hours. No results for input(s): AMMONIA in the last 168 hours. Cardiac Enzymes: No results for input(s): CKTOTAL, CKMB, CKMBINDEX, TROPONINI in the last 168 hours. BNP (last 3 results) Recent Labs    03/24/20 1426  BNP 537.5*    ProBNP (last 3 results) No  results for input(s): PROBNP in the last 8760 hours.  CBG: Recent Labs  Lab 05/18/20 1310 05/18/20 1745 05/18/20 2259 05/19/20 0745 05/19/20 1130  GLUCAP 192* 234* 265* 97 153*   Recent Results (from the past 240 hour(s))  Novel Coronavirus, NAA ([facility])     Status: None   Collection Time: 05/13/20 12:00 AM   Specimen: Oropharyngeal(OP) collection in vial transport medium   Oropharyngea  Result Value Ref Range Status   SARS-CoV-2, NAA Not Detected Not Detected Final    Comment: This nucleic acid amplification test was developed and its performance characteristics determined by [employer]. Nucleic acid amplification tests include RT-PCR and TMA. This test has not been FDA cleared or approved. This test has been authorized by FDA under an Emergency Use Authorization (EUA). This test is only authorized for the duration of time the declaration that circumstances exist justifying the authorization of the emergency use of in vitro diagnostic tests for detection of SARS-CoV-2 virus and/or diagnosis of COVID-19 infection under section 564(b)(1) of the Act, 21 U.S.C. 735HGD-9(M) (1), unless the authorization is terminated or revoked sooner. When diagnostic testing is negative, the possibility of a false negative result should be considered in the context of a patient's recent exposures and the presence of clinical signs and symptoms consistent with COVID-19. An individual without symptoms of COVID-19 and who is not shedding SARS-CoV-2 virus wo uld expect to have a negative (not detected) result in this assay.   SARS-COV-2, NAA 2 DAY TAT     Status: None   Collection Time: 05/13/20 12:00 AM   Oropharyngea  Result Value Ref Range Status   SARS-CoV-2, NAA 2 DAY TAT Performed  Final  Culture, blood (routine x 2)     Status: None   Collection Time: 05/14/20  5:10 PM   Specimen: BLOOD  Result Value Ref Range Status   Specimen Description   Final    BLOOD PORTA  CATH Performed at [facility] [address], [place], [place] [address]    Special Requests   Final    BOTTLES DRAWN AEROBIC AND ANAEROBIC Blood Culture adequate volume Performed at [facility] [address] [place], [place] [address]    Culture   Final    NO GROWTH 5 DAYS Performed at [facility], [place] [address] [place], [place] [address]  Report Status 05/19/2020 FINAL  Final  SARS Coronavirus 2 by RT PCR (hospital order, performed in [facility] hospital lab) Nasopharyngeal Nasopharyngeal Swab     Status: Abnormal   Collection Time: 05/14/20  5:10 PM   Specimen: Nasopharyngeal Swab  Result Value Ref Range Status   SARS Coronavirus 2 POSITIVE (A) NEGATIVE Final    Comment: RESULT CALLED TO, READ BACK BY AND VERIFIED WITH: BROOKS,B. RN @1856  ON 02.02.2022 BY COHEN,K (NOTE) SARS-CoV-2 target nucleic acids are DETECTED  SARS-CoV-2 RNA is generally detectable in upper respiratory specimens  during the acute phase of infection.  Positive results are indicative  of the presence of the identified virus, but do not rule out bacterial infection or co-infection with other pathogens not detected by the test.  Clinical correlation with patient history and  other diagnostic information is necessary to determine patient infection status.  The expected result is negative.  Fact Sheet for Patients:   StrictlyIdeas.no   Fact Sheet for Healthcare Providers:   BankingDealers.co.za    This test is not yet approved or cleared by the [place] FDA and  has been authorized for detection and/or diagnosis of SARS-CoV-2 by FDA under an Emergency Use Authorization (EUA).  This EUA will remain in effect (meanin g this test can be used) for the duration of  the COVID-19 declaration under Section 564(b)(1) of the Act, 21 U.S.C. section 360-bbb-3(b)(1), unless the authorization is terminated or  revoked sooner.  Performed at [facility], [place] [address] [place], [place] [address]      Studies: No results found.    Flora Lipps, MD  [facility] 05/19/2020  If 7PM-7AM, please contact night-coverage

## 2020-05-19 NOTE — Care Management Important Message (Signed)
Important Message  Patient Details IM Letter placed in Patient's door Caddy. Name: RITTER HELSLEY [name]: [id number] Date of Birth: 1946-04-02   Medicare Important Message Given:  Yes     Kerin Salen 05/19/2020, 1:46 PM

## 2020-05-19 NOTE — Progress Notes (Signed)
Physical Therapy Treatment Patient Details Name: [name] [name]: [id number] DOB: 10-Feb-1946 Today's Date: 05/19/2020    History of Present Illness 75 y.o. male with medical history significant for small cell carcinoma of the right lung on chemotherapy and radiation associated with right pleural effusion, paroxysmal atrial fibrillation not anticoagulated chronically, hyperlipidemia, type 2 diabetes mellitus, stage IIIa chronic kidney disease with baseline creatinine 1.2-1.5, chronic anemia with baseline hemoglobin 7-9 who is admitted to [facility] on 05/14/2020 with acute hypoxic respiratory distress in the setting of severe COVID-19 infection    PT Comments    VERY limited session due to HR.  General bed mobility comments: self able with use of rails and increased time.  General transfer comment: partially seated EOB only due to effort and HR 150's.  Currently on 4 L [place] sats avg 94% but overalll not feeling well and concerned about his HR. Pt on tele.  Pt also coughing with sats decreased to 87%.  Assisted back to bed.    Follow Up Recommendations  Home health PT     Equipment Recommendations  None recommended by PT    Recommendations for Other Services       Precautions / Restrictions Precautions Precautions: Fall    Mobility  Bed Mobility Overal bed mobility: Modified Independent             General bed mobility comments: self able with use of rails and increased time  Transfers                 General transfer comment: partially seated EOB only due to effort and HR 150's.  Currently on 4 L [place] sats avg 94% but overalll not feeling well and concerned about his HR.  Pt also coughing with sats decreased to 87%.  Ambulation/Gait                 Stairs             Wheelchair Mobility    Modified Rankin (Stroke Patients Only)       Balance                                            Cognition Arousal/Alertness:  Awake/alert Behavior During Therapy: WFL for tasks assessed/performed Overall Cognitive Status: Within Functional Limits for tasks assessed                                 General Comments: AxO x 3      Exercises      General Comments        Pertinent Vitals/Pain Pain Assessment: Faces Faces Pain Scale: Hurts a little bit Pain Location: general/back Pain Descriptors / Indicators: Aching Pain Intervention(s): Monitored during session    Home Living                      Prior Function            PT Goals (current goals can now be found in the care plan section) Progress towards PT goals: Progressing toward goals    Frequency    Min 3X/week      PT Plan Current plan remains appropriate    Co-evaluation              AM-PAC PT "6  Clicks" Mobility   Outcome Measure  Help needed turning from your back to your side while in a flat bed without using bedrails?: A Little Help needed moving from lying on your back to sitting on the side of a flat bed without using bedrails?: A Little Help needed moving to and from a bed to a chair (including a wheelchair)?: A Little Help needed standing up from a chair using your arms (e.g., wheelchair or bedside chair)?: A Little Help needed to walk in hospital room?: A Lot Help needed climbing 3-5 steps with a railing? : A Lot 6 Click Score: 16    End of Session Equipment Utilized During Treatment: Gait belt;Oxygen Activity Tolerance: Treatment limited secondary to medical complications (Comment) Patient left: in bed;with call bell/phone within reach   PT Visit Diagnosis: Difficulty in walking, not elsewhere classified (R26.2)     Time: 9842-1031 PT Time Calculation (min) (ACUTE ONLY): 11 min  Charges:  $Therapeutic Activity: 8-22 mins                     {Iverna Hammac  PTA Acute  Rehabilitation Services Pager      [phone] Office      [phone]

## 2020-05-19 NOTE — Progress Notes (Signed)
[place] for IV heparin Indication: atrial fibrillation  Allergies  Allergen Reactions  . Tetracycline Itching and Rash    Patient Measurements: Height: 5\' 11"  (180.3 cm) Weight: 87.9 kg (193 lb 12.8 oz) IBW/kg (Calculated) : 75.3 Heparin Dosing Weight: TBW  Vital Signs: BP: 116/94 (02/07 1141) Pulse Rate: 150 (02/07 1141)  Labs: Recent Labs    05/17/20 0430 05/18/20 0531 05/19/20 0502  HGB 8.6* 8.9* 8.9*  HCT 27.7* 28.3* 28.5*  PLT 180 153 147*  CREATININE 1.24 1.17 1.12    Estimated Creatinine Clearance: 61.6 mL/min (by C-G formula based on SCr of 1.12 mg/dL).   Medical History: Past Medical History:  Diagnosis Date  . Allergic rhinitis due to pollen   . Chronic kidney disease, stage III (moderate) ([place]) 12/19/2012  . Depression   . Diabetes mellitus ([place])   . Diverticulosis   . Erectile dysfunction associated with type 2 diabetes mellitus ([place]) 05/28/2015  . Former very heavy cigarette smoker (more than 40 per day) 10/07/2014  . GERD (gastroesophageal reflux disease)   . Gout   . Hyperlipidemia   . Hypertension   . Hypogonadism male 06/14/2012  . Memory loss   . Osteoarthritis   . Personal history of colonic polyps    1996  . Small cell carcinoma of right lung ([place]) 10/23/2019    Medications:  Medications Prior to Admission  Medication Sig Dispense Refill Last Dose  . acetaminophen (TYLENOL) 650 MG CR tablet Take 650 mg by mouth every 8 (eight) hours as needed for pain.   05/13/2020 at Unknown time  . albuterol (VENTOLIN HFA) 108 (90 Base) MCG/ACT inhaler Inhale 1-2 puffs into the lungs every 6 (six) hours as needed for wheezing or shortness of breath. 8 g 2 05/14/2020 at Unknown time  . Ascorbic Acid (VITAMIN C) 1000 MG tablet Take 1,000 mg by mouth daily.   05/14/2020 at Unknown time  . cefdinir (OMNICEF) 300 MG capsule Take 2 capsules (600 mg total) by mouth daily for 10 days. 20 capsule 0 05/14/2020 at Unknown time  .  cholecalciferol (VITAMIN D) 1000 UNITS tablet Take 2,000 Units by mouth daily.   05/14/2020 at Unknown time  . diltiazem (CARDIZEM CD) 240 MG 24 hr capsule Take 1 capsule (240 mg total) by mouth daily. 30 capsule 2 05/14/2020 at Unknown time  . donepezil (ARICEPT) 10 MG tablet TAKE 1 TABLET BY MOUTH AT BEDTIME 90 tablet 3 05/13/2020 at Unknown time  . fluticasone (FLONASE) 50 MCG/ACT nasal spray Place 1 spray into both nostrils daily as needed for allergies.   Past Week at Unknown time  . guaiFENesin (MUCINEX) 600 MG 12 hr tablet Take 600 mg by mouth daily as needed for cough.   05/14/2020 at Unknown time  . insulin regular human CONCENTRATED (HUMULIN R U-500 KWIKPEN) 500 UNIT/ML kwikpen Inject 20 Units into the skin with breakfast, with lunch, and with evening meal.   05/14/2020 at Unknown time  . memantine (NAMENDA) 10 MG tablet TAKE 1 TABLET BY MOUTH TWICE A DAY 180 tablet 3 05/14/2020 at Unknown time  . montelukast (SINGULAIR) 10 MG tablet Take 1 tablet (10 mg total) by mouth at bedtime. 30 tablet 3 05/13/2020 at Unknown time  . pantoprazole (PROTONIX) 20 MG tablet Take 1 tablet (20 mg total) by mouth daily. 30 tablet 2 05/14/2020 at Unknown time  . simvastatin (ZOCOR) 40 MG tablet TAKE 1 TABLET BY MOUTH AT BEDTIME 90 tablet 2 05/13/2020 at Unknown time  .  venlafaxine XR (EFFEXOR-XR) 75 MG 24 hr capsule TAKE 1 CAPSULE BY MOUTH EVERY MORNING AND 2 CAPSULES AT BEDTIME (Patient taking differently: Take 75-150 mg by mouth in the morning and at bedtime. TAKE 1 CAPSULE BY MOUTH EVERY MORNING AND 2 CAPSULES AT BEDTIME) 270 capsule 1 05/14/2020 at Unknown time  . Hyprom-Naphaz-Polysorb-Zn Sulf (CLEAR EYES COMPLETE OP) Place 1 drop into both eyes daily as needed (dry eyes).   unknown  . Insulin Pen Needle (PEN NEEDLES) 31G X 5 MM MISC 1 each by Does not apply route 3 (three) times daily. To inject insulin E11.42 90 each 2   . predniSONE (DELTASONE) 10 MG tablet 2 tabs po for 3 days, then 1 tablet po for 5 days 11 tablet 0   .  prochlorperazine (COMPAZINE) 10 MG tablet Take 1 tablet (10 mg total) by mouth every 6 (six) hours as needed. (Patient not taking: Reported on 05/14/2020) 30 tablet 2 Completed Course at Unknown time   Scheduled:  . atorvastatin  20 mg Oral QHS  . Chlorhexidine Gluconate Cloth  6 each Topical Daily  . diltiazem  300 mg Oral Daily  . donepezil  10 mg Oral QHS  . enoxaparin (LOVENOX) injection  40 mg Subcutaneous Q24H  . insulin aspart  0-20 Units Subcutaneous TID WC  . insulin aspart  0-5 Units Subcutaneous QHS  . insulin aspart  5 Units Subcutaneous TID WC  . insulin glargine  20 Units Subcutaneous Daily  . Ipratropium-Albuterol  1 puff Inhalation TID  . linagliptin  5 mg Oral Daily  . magnesium oxide  400 mg Oral BID  . mouth rinse  15 mL Mouth Rinse BID  . memantine  10 mg Oral BID  . methylPREDNISolone (SOLU-MEDROL) injection  40 mg Intravenous Q12H  . montelukast  10 mg Oral QHS  . potassium chloride  40 mEq Oral BID   Infusions:    Assessment: 82 yoM with PMH non-obstructive CAD, HTN, DM2, CKD2, SCLC s/p chemo/rads and recently dx atrial fibrillation/atrial flutter previously not on AC due to hx of anemia/thrombocytopenia (during chemo). Now again with episodes of PAF with RVR. Seen by Cardiology, agreeable to proceed with heparin as CBC improved with completion of chemo; Pharmacy consulted to dose.   Baseline INR 1.3; aPTT not done  Prior anticoagulation: Lovenox 40 mg daily for VTE ppx; last dose 2/6 at 930p  Significant events:  Today, 05/19/2020:  CBC: Hgb low but improved from earlier this admission w/o transfusion; Plt borderline low but stable  Mild AKI on admission resolved; SCr returned to baseline (1.2)  No bleeding or infusion issues per nursing  Goal of Therapy: Heparin level 0.3-0.7 units/ml Monitor platelets by anticoagulation protocol: Yes  Plan:  Heparin 4000 units IV bolus x 1  Heparin 1300 units/hr IV infusion  Check heparin level 8 hrs after  start  Daily CBC, daily heparin level once stable  Monitor for signs of bleeding or thrombosis  Reuel Boom, PharmD, BCPS [phone] 05/19/2020, 4:40 PM

## 2020-05-19 NOTE — Telephone Encounter (Signed)
Raquel Sarna from [facility] states the patient called them this morning. She states the patient is in the hospital and would like Dr. Radford Pax to see him. Please advise.

## 2020-05-20 ENCOUNTER — Ambulatory Visit: Payer: Medicare Other

## 2020-05-20 ENCOUNTER — Ambulatory Visit: Payer: Self-pay | Admitting: *Deleted

## 2020-05-20 DIAGNOSIS — U071 COVID-19: Secondary | ICD-10-CM | POA: Diagnosis not present

## 2020-05-20 DIAGNOSIS — I4891 Unspecified atrial fibrillation: Secondary | ICD-10-CM | POA: Diagnosis not present

## 2020-05-20 DIAGNOSIS — R531 Weakness: Secondary | ICD-10-CM | POA: Diagnosis not present

## 2020-05-20 DIAGNOSIS — E119 Type 2 diabetes mellitus without complications: Secondary | ICD-10-CM | POA: Diagnosis not present

## 2020-05-20 LAB — CBC WITH DIFFERENTIAL/PLATELET
Abs Immature Granulocytes: 0.04 10*3/uL (ref 0.00–0.07)
Basophils Absolute: 0 10*3/uL (ref 0.0–0.1)
Basophils Relative: 0 %
Eosinophils Absolute: 0 10*3/uL (ref 0.0–0.5)
Eosinophils Relative: 0 %
HCT: 30.7 % — ABNORMAL LOW (ref 39.0–52.0)
Hemoglobin: 9.4 g/dL — ABNORMAL LOW (ref 13.0–17.0)
Immature Granulocytes: 1 %
Lymphocytes Relative: 1 %
Lymphs Abs: 0.1 10*3/uL — ABNORMAL LOW (ref 0.7–4.0)
MCH: 30.4 pg (ref 26.0–34.0)
MCHC: 30.6 g/dL (ref 30.0–36.0)
MCV: 99.4 fL (ref 80.0–100.0)
Monocytes Absolute: 0.4 10*3/uL (ref 0.1–1.0)
Monocytes Relative: 5 %
Neutro Abs: 7.4 10*3/uL (ref 1.7–7.7)
Neutrophils Relative %: 93 %
Platelets: 141 10*3/uL — ABNORMAL LOW (ref 150–400)
RBC: 3.09 MIL/uL — ABNORMAL LOW (ref 4.22–5.81)
RDW: 16.1 % — ABNORMAL HIGH (ref 11.5–15.5)
WBC: 7.9 10*3/uL (ref 4.0–10.5)
nRBC: 0 % (ref 0.0–0.2)

## 2020-05-20 LAB — GLUCOSE, CAPILLARY
Glucose-Capillary: 54 mg/dL — ABNORMAL LOW (ref 70–99)
Glucose-Capillary: 175 mg/dL — ABNORMAL HIGH (ref 70–99)
Glucose-Capillary: 200 mg/dL — ABNORMAL HIGH (ref 70–99)
Glucose-Capillary: 288 mg/dL — ABNORMAL HIGH (ref 70–99)
Glucose-Capillary: 338 mg/dL — ABNORMAL HIGH (ref 70–99)

## 2020-05-20 LAB — COMPREHENSIVE METABOLIC PANEL
ALT: 30 U/L (ref 0–44)
AST: 19 U/L (ref 15–41)
Albumin: 2.8 g/dL — ABNORMAL LOW (ref 3.5–5.0)
Alkaline Phosphatase: 86 U/L (ref 38–126)
Anion gap: 11 (ref 5–15)
BUN: 44 mg/dL — ABNORMAL HIGH (ref 8–23)
CO2: 29 mmol/L (ref 22–32)
Calcium: 8.7 mg/dL — ABNORMAL LOW (ref 8.9–10.3)
Chloride: 99 mmol/L (ref 98–111)
Creatinine, Ser: 1.31 mg/dL — ABNORMAL HIGH (ref 0.61–1.24)
GFR, Estimated: 57 mL/min — ABNORMAL LOW (ref >60)
Glucose, Bld: 58 mg/dL — ABNORMAL LOW (ref 70–99)
Potassium: 3.9 mmol/L (ref 3.5–5.1)
Sodium: 139 mmol/L (ref 135–145)
Total Bilirubin: 0.8 mg/dL (ref 0.3–1.2)
Total Protein: 6.2 g/dL — ABNORMAL LOW (ref 6.5–8.1)

## 2020-05-20 LAB — D-DIMER, QUANTITATIVE: D-Dimer, Quant: 2.45 ug/mL-FEU — ABNORMAL HIGH (ref 0.00–0.50)

## 2020-05-20 LAB — C-REACTIVE PROTEIN: CRP: 5.9 mg/dL — ABNORMAL HIGH (ref <1.0)

## 2020-05-20 LAB — FERRITIN: Ferritin: 547 ng/mL — ABNORMAL HIGH (ref 24–336)

## 2020-05-20 LAB — HEPARIN LEVEL (UNFRACTIONATED)
Heparin Unfractionated: 0.53 IU/mL (ref 0.30–0.70)
Heparin Unfractionated: 0.38 IU/mL (ref 0.30–0.70)

## 2020-05-20 LAB — MAGNESIUM: Magnesium: 1.9 mg/dL (ref 1.7–2.4)

## 2020-05-20 LAB — FIBRINOGEN: Fibrinogen: 475 mg/dL (ref 210–475)

## 2020-05-20 MED ORDER — LIDOCAINE VISCOUS HCL 2 % MT SOLN
15.0000 mL | Freq: Four times a day (QID) | OROMUCOSAL | Status: DC | PRN
  Administered 2020-05-20: 15 mL via OROMUCOSAL
  Filled 2020-05-20: qty 15

## 2020-05-20 MED ORDER — METOPROLOL TARTRATE 25 MG PO TABS
25.0000 mg | ORAL_TABLET | Freq: Two times a day (BID) | ORAL | Status: DC
  Administered 2020-05-20 (×2): 25 mg via ORAL
  Filled 2020-05-20 (×2): qty 1

## 2020-05-20 NOTE — Progress Notes (Signed)
[place] for IV heparin Indication: atrial fibrillation  Allergies  Allergen Reactions   Tetracycline Itching and Rash    Patient Measurements: Height: 5\' 11"  (180.3 cm) Weight: 87.9 kg (193 lb 12.8 oz) IBW/kg (Calculated) : 75.3 Heparin Dosing Weight: TBW  Vital Signs: Temp: 97.6 F (36.4 C) (02/08 0200) Temp Source: Oral (02/08 0200) BP: 134/98 (02/08 0200) Pulse Rate: 130 (02/08 0200)  Labs: Recent Labs    05/18/20 0531 05/19/20 0502 05/20/20 0141  HGB 8.9* 8.9* 9.4*  HCT 28.3* 28.5* 30.7*  PLT 153 147* 141*  HEPARINUNFRC  --   --  0.53  CREATININE 1.17 1.12 1.31*    Estimated Creatinine Clearance: 52.7 mL/min (A) (by C-G formula based on SCr of 1.31 mg/dL (H)).   Medical History: Past Medical History:  Diagnosis Date   Allergic rhinitis due to pollen    Chronic kidney disease, stage III (moderate) ([place]) 12/19/2012   Depression    Diabetes mellitus ([place])    Diverticulosis    Erectile dysfunction associated with type 2 diabetes mellitus ([place]) 05/28/2015   Former very heavy cigarette smoker (more than 40 per day) 10/07/2014   GERD (gastroesophageal reflux disease)    Gout    Hyperlipidemia    Hypertension    Hypogonadism male 06/14/2012   Memory loss    Osteoarthritis    Personal history of colonic polyps    1996   Small cell carcinoma of right lung ([place]) 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   05/13/2020 at Unknown time   albuterol (VENTOLIN HFA) 108 (90 Base) MCG/ACT inhaler Inhale 1-2 puffs into the lungs every 6 (six) hours as needed for wheezing or shortness of breath. 8 g 2 05/14/2020 at Unknown time   Ascorbic Acid (VITAMIN C) 1000 MG tablet Take 1,000 mg by mouth daily.   05/14/2020 at Unknown time   cefdinir (OMNICEF) 300 MG  capsule Take 2 capsules (600 mg total) by mouth daily for 10 days. 20 capsule 0 05/14/2020 at Unknown time   cholecalciferol (VITAMIN D) 1000 UNITS tablet Take 2,000 Units by mouth daily.   05/14/2020 at Unknown time   diltiazem (CARDIZEM CD) 240 MG 24 hr capsule Take 1 capsule (240 mg total) by mouth daily. 30 capsule 2 05/14/2020 at Unknown time   donepezil (ARICEPT) 10 MG tablet TAKE 1 TABLET BY MOUTH AT BEDTIME 90 tablet 3 05/13/2020 at Unknown time   fluticasone (FLONASE) 50 MCG/ACT nasal spray Place 1 spray into both nostrils daily as needed for allergies.   Past Week at Unknown time   guaiFENesin (MUCINEX) 600 MG 12 hr tablet Take 600 mg by mouth daily as needed for cough.   05/14/2020 at Unknown time   insulin regular human CONCENTRATED (HUMULIN R U-500 KWIKPEN) 500 UNIT/ML kwikpen Inject 20 Units into the skin with breakfast, with lunch, and with evening meal.   05/14/2020 at Unknown time   memantine (NAMENDA) 10 MG tablet TAKE 1 TABLET BY MOUTH TWICE A DAY 180 tablet 3 05/14/2020 at Unknown time   montelukast (SINGULAIR) 10 MG tablet Take 1 tablet (10 mg total) by mouth at bedtime. 30 tablet 3 05/13/2020 at Unknown time   pantoprazole (PROTONIX) 20 MG tablet Take 1 tablet (20 mg total) by mouth daily. 30 tablet 2 05/14/2020 at Unknown time  simvastatin (ZOCOR) 40 MG tablet TAKE 1 TABLET BY MOUTH AT BEDTIME 90 tablet 2 05/13/2020 at Unknown time   venlafaxine XR (EFFEXOR-XR) 75 MG 24 hr capsule TAKE 1 CAPSULE BY MOUTH EVERY MORNING AND 2 CAPSULES AT BEDTIME (Patient taking differently: Take 75-150 mg by mouth in the morning and at bedtime. TAKE 1 CAPSULE BY MOUTH EVERY MORNING AND 2 CAPSULES AT BEDTIME) 270 capsule 1 05/14/2020 at Unknown time   Hyprom-Naphaz-Polysorb-Zn Sulf (CLEAR EYES COMPLETE OP) Place 1 drop into both eyes daily as needed (dry eyes).   unknown   Insulin Pen Needle (PEN NEEDLES) 31G X 5 MM MISC 1 each by Does not apply route 3 (three) times daily. To inject insulin E11.42 90 each 2     predniSONE (DELTASONE) 10 MG tablet 2 tabs po for 3 days, then 1 tablet po for 5 days 11 tablet 0    prochlorperazine (COMPAZINE) 10 MG tablet Take 1 tablet (10 mg total) by mouth every 6 (six) hours as needed. (Patient not taking: Reported on 05/14/2020) 30 tablet 2 Completed Course at Unknown time   Scheduled:   atorvastatin  20 mg Oral QHS   Chlorhexidine Gluconate Cloth  6 each Topical Daily   diltiazem  300 mg Oral Daily   donepezil  10 mg Oral QHS   insulin aspart  0-20 Units Subcutaneous TID WC   insulin aspart  0-5 Units Subcutaneous QHS   insulin aspart  5 Units Subcutaneous TID WC   insulin glargine  20 Units Subcutaneous Daily   Ipratropium-Albuterol  1 puff Inhalation TID   linagliptin  5 mg Oral Daily   magnesium oxide  400 mg Oral BID   mouth rinse  15 mL Mouth Rinse BID   memantine  10 mg Oral BID   methylPREDNISolone (SOLU-MEDROL) injection  40 mg Intravenous Q12H   montelukast  10 mg Oral QHS   potassium chloride  40 mEq Oral BID   Infusions:   heparin 1,300 Units/hr (05/19/20 1814)    Assessment: 31 yoM with PMH non-obstructive CAD, HTN, DM2, CKD2, SCLC s/p chemo/rads and recently dx atrial fibrillation/atrial flutter previously not on AC due to hx of anemia/thrombocytopenia (during chemo). Now again with episodes of PAF with RVR. Seen by Cardiology, agreeable to proceed with heparin as CBC improved with completion of chemo; Pharmacy consulted to dose.   Baseline INR 1.3; aPTT not done  Prior anticoagulation: Lovenox 40 mg daily for VTE ppx; last dose 2/6 at 930p  Significant events:  Today, 05/20/2020:  HL 0.53 therapeutic on 1300 units/hr  CBC: Hgb low but improved from earlier this admission w/o transfusion; Plt borderline low but stable  Mild AKI on admission resolved; SCr 1.3  No bleeding or infusion issues noted  Goal of Therapy: Heparin level 0.3-0.7 units/ml Monitor platelets by anticoagulation protocol:  Yes  Plan:  Continue Heparin at 1300 units/hr IV infusion  Check confirmatory heparin level in 8 hrs   Daily CBC, daily heparin level once stable  Monitor for signs of bleeding or thrombosis  Dolly Rias RPh 05/20/2020, 3:01 AM

## 2020-05-20 NOTE — Plan of Care (Signed)

## 2020-05-20 NOTE — Progress Notes (Signed)
[place] for IV heparin Indication: atrial fibrillation  Allergies  Allergen Reactions  . Tetracycline Itching and Rash   Patient Measurements: Height: 5\' 11"  (180.3 cm) Weight: 90.3 kg (199 lb 1.2 oz) IBW/kg (Calculated) : 75.3 Heparin Dosing Weight: TBW  Vital Signs: Temp: 97.6 F (36.4 C) (02/08 0200) Temp Source: Oral (02/08 0200) BP: 134/98 (02/08 0200) Pulse Rate: 143 (02/08 0325)  Labs: Recent Labs    05/18/20 0531 05/19/20 0502 05/20/20 0141 05/20/20 1130  HGB 8.9* 8.9* 9.4*  --   HCT 28.3* 28.5* 30.7*  --   PLT 153 147* 141*  --   HEPARINUNFRC  --   --  0.53 0.38  CREATININE 1.17 1.12 1.31*  --    Estimated Creatinine Clearance: 52.7 mL/min (A) (by C-G formula based on SCr of 1.31 mg/dL (H)).  Medical History: Past Medical History:  Diagnosis Date  . Allergic rhinitis due to pollen   . Chronic kidney disease, stage III (moderate) ([place]) 12/19/2012  . Depression   . Diabetes mellitus ([place])   . Diverticulosis   . Erectile dysfunction associated with type 2 diabetes mellitus ([place]) 05/28/2015  . Former very heavy cigarette smoker (more than 40 per day) 10/07/2014  . GERD (gastroesophageal reflux disease)   . Gout   . Hyperlipidemia   . Hypertension   . Hypogonadism male 06/14/2012  . Memory loss   . Osteoarthritis   . Personal history of colonic polyps    1996  . 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 acetaminophen (TYLENOL) 650 MG CR tablet Take 650 mg by mouth every 8 (eight) hours as needed for pain.   05/13/2020 at Unknown time  . albuterol (VENTOLIN HFA) 108 (90 Base) MCG/ACT inhaler Inhale 1-2 puffs into the lungs every 6 (six) hours as needed for wheezing or shortness of breath. 8 g 2 05/14/2020 at Unknown time  . Ascorbic Acid (VITAMIN C) 1000 MG tablet Take 1,000 mg by mouth daily.   05/14/2020 at Unknown time   . cefdinir (OMNICEF) 300 MG capsule Take 2 capsules (600 mg total) by mouth daily for 10 days. 20 capsule 0 05/14/2020 at Unknown time  . cholecalciferol (VITAMIN D) 1000 UNITS tablet Take 2,000 Units by mouth daily.   05/14/2020 at Unknown time  . diltiazem (CARDIZEM CD) 240 MG 24 hr capsule Take 1 capsule (240 mg total) by mouth daily. 30 capsule 2 05/14/2020 at Unknown time  . donepezil (ARICEPT) 10 MG tablet TAKE 1 TABLET BY MOUTH AT BEDTIME 90 tablet 3 05/13/2020 at Unknown time  . fluticasone (FLONASE) 50 MCG/ACT nasal spray Place 1 spray into both nostrils daily as needed for allergies.   Past Week at Unknown time  . guaiFENesin (MUCINEX) 600 MG 12 hr tablet Take 600 mg by mouth daily as needed for cough.   05/14/2020 at Unknown time  . insulin regular human CONCENTRATED (HUMULIN R U-500 KWIKPEN) 500 UNIT/ML kwikpen Inject 20 Units into the skin with breakfast, with lunch, and with evening meal.   05/14/2020 at Unknown time  . memantine (NAMENDA) 10 MG tablet TAKE 1 TABLET BY MOUTH TWICE A DAY 180 tablet 3 05/14/2020 at Unknown time  . montelukast (SINGULAIR) 10 MG tablet Take 1 tablet (10 mg total) by mouth at bedtime. 30 tablet 3 05/13/2020 at Unknown time  . pantoprazole (PROTONIX) 20 MG tablet Take 1 tablet (20 mg  total) by mouth daily. 30 tablet 2 05/14/2020 at Unknown time  . simvastatin (ZOCOR) 40 MG tablet TAKE 1 TABLET BY MOUTH AT BEDTIME 90 tablet 2 05/13/2020 at Unknown time  . venlafaxine XR (EFFEXOR-XR) 75 MG 24 hr capsule TAKE 1 CAPSULE BY MOUTH EVERY MORNING AND 2 CAPSULES AT BEDTIME (Patient taking differently: Take 75-150 mg by mouth in the morning and at bedtime. TAKE 1 CAPSULE BY MOUTH EVERY MORNING AND 2 CAPSULES AT BEDTIME) 270 capsule 1 05/14/2020 at Unknown time  . Hyprom-Naphaz-Polysorb-Zn Sulf (CLEAR EYES COMPLETE OP) Place 1 drop into both eyes daily as needed (dry eyes).   unknown  . Insulin Pen Needle (PEN NEEDLES) 31G X 5 MM MISC 1 each by Does not apply route 3 (three) times daily. To  inject insulin E11.42 90 each 2   . predniSONE (DELTASONE) 10 MG tablet 2 tabs po for 3 days, then 1 tablet po for 5 days 11 tablet 0   . prochlorperazine (COMPAZINE) 10 MG tablet Take 1 tablet (10 mg total) by mouth every 6 (six) hours as needed. (Patient not taking: Reported on 05/14/2020) 30 tablet 2 Completed Course at Unknown time   Scheduled:  . atorvastatin  20 mg Oral QHS  . Chlorhexidine Gluconate Cloth  6 each Topical Daily  . diltiazem  300 mg Oral Daily  . donepezil  10 mg Oral QHS  . insulin aspart  0-20 Units Subcutaneous TID WC  . insulin aspart  0-5 Units Subcutaneous QHS  . insulin aspart  5 Units Subcutaneous TID WC  . insulin glargine  20 Units Subcutaneous Daily  . Ipratropium-Albuterol  1 puff Inhalation TID  . linagliptin  5 mg Oral Daily  . magnesium oxide  400 mg Oral BID  . mouth rinse  15 mL Mouth Rinse BID  . memantine  10 mg Oral BID  . methylPREDNISolone (SOLU-MEDROL) injection  40 mg Intravenous Q12H  . metoprolol tartrate  25 mg Oral BID  . montelukast  10 mg Oral QHS  . potassium chloride  40 mEq Oral BID   Infusions:  . heparin 1,300 Units/hr (05/20/20 1017)    Assessment: 29 yoM with PMH non-obstructive CAD, HTN, DM2, CKD2, SCLC s/p chemo/rads and recently dx atrial fibrillation/atrial flutter previously not on AC due to hx of anemia/thrombocytopenia (during chemo). Now again with episodes of PAF with RVR. Seen by Cardiology, agreeable to proceed with heparin as CBC improved with completion of chemo; Pharmacy consulted to dose.   Baseline INR 1.3; aPTT not done  Prior anticoagulation: Lovenox 40 mg daily for VTE ppx; last dose 2/6 at 930p  Significant events:  Today, 05/20/2020:  0141 HL 0.53 therapeutic on 1300 units/hr  CBC: Hgb low but improved from earlier this admission w/o transfusion; Plt borderline low but stable  Mild AKI on admission resolved; SCr 1.3  No bleeding or infusion issues noted  1130 confirmatory HL 0.38, remains in  therapeutic range  Goal of Therapy: Heparin level 0.3-0.7 units/ml Monitor platelets by anticoagulation protocol: Yes  Plan:  Continue Heparin infusion at 1300 units/hr   Daily CBC, daily heparin level   Monitor for signs of bleeding or thrombosis  Minda Ditto PharmD 05/20/2020, 12:34 PM

## 2020-05-20 NOTE — Progress Notes (Signed)
Progress Note  Patient Name: [name] Date of Encounter: 05/20/2020  Primary Cardiologist: Dr. Fransico Him, MD   Subjective   Denies any chest pain or dyspnea  Inpatient Medications    Scheduled Meds: . atorvastatin  20 mg Oral QHS  . Chlorhexidine Gluconate Cloth  6 each Topical Daily  . diltiazem  300 mg Oral Daily  . donepezil  10 mg Oral QHS  . insulin aspart  0-20 Units Subcutaneous TID WC  . insulin aspart  0-5 Units Subcutaneous QHS  . insulin aspart  5 Units Subcutaneous TID WC  . insulin glargine  20 Units Subcutaneous Daily  . Ipratropium-Albuterol  1 puff Inhalation TID  . linagliptin  5 mg Oral Daily  . magnesium oxide  400 mg Oral BID  . mouth rinse  15 mL Mouth Rinse BID  . memantine  10 mg Oral BID  . methylPREDNISolone (SOLU-MEDROL) injection  40 mg Intravenous Q12H  . montelukast  10 mg Oral QHS  . potassium chloride  40 mEq Oral BID   Continuous Infusions: . heparin 1,300 Units/hr (05/19/20 1814)   PRN Meds: acetaminophen **OR** acetaminophen, albuterol, doxylamine (Sleep), guaiFENesin, hydrALAZINE, metoprolol tartrate, sodium chloride flush   Vital Signs    Vitals:   05/19/20 2147 05/20/20 0200 05/20/20 0308 05/20/20 0325  BP: (!) 145/95 (!) 134/98    Pulse:  (!) 130  (!) 143  Resp: 20 18    Temp: (!) 97.5 F (36.4 C) 97.6 F (36.4 C)    TempSrc: Oral Oral    SpO2: 100% 100%    Weight:   90.3 kg   Height:        Intake/Output Summary (Last 24 hours) at 05/20/2020 0750 Last data filed at 05/20/2020 0258 Gross per 24 hour  Intake 360 ml  Output 500 ml  Net -140 ml   Filed Weights   05/16/20 0407 05/18/20 0500 05/20/20 0308  Weight: 89 kg 87.9 kg 90.3 kg    Physical Exam   General:   in no acute distress Neck: no JVD Cardiac:  Tachycardic, irregular no murmur  Lungs:  Diminished breath sounds Abd: soft, nontender Ext: no edema Musculoskeletal:  No deformities Skin: no rash Neuro:  no focal abnormalities noted Psych:   Normal affect   Labs    Chemistry Recent Labs  Lab 05/18/20 0531 05/19/20 0502 05/20/20 0141  NA 141 139 139  K 3.5 3.2* 3.9  CL 99 97* 99  CO2 32 31 29  GLUCOSE 126* 110* 58*  BUN 40* 41* 44*  CREATININE 1.17 1.12 1.31*  CALCIUM 8.6* 8.5* 8.7*  PROT 6.8 6.2* 6.2*  ALBUMIN 3.0* 2.7* 2.8*  AST 15 24 19   ALT 14 31 30   ALKPHOS 76 88 86  BILITOT 0.5 0.6 0.8  GFRNONAA >60 >60 57*  ANIONGAP 10 11 11      Hematology Recent Labs  Lab 05/18/20 0531 05/19/20 0502 05/20/20 0141  WBC 8.4 8.5 7.9  RBC 2.93* 2.93* 3.09*  HGB 8.9* 8.9* 9.4*  HCT 28.3* 28.5* 30.7*  MCV 96.6 97.3 99.4  MCH 30.4 30.4 30.4  MCHC 31.4 31.2 30.6  RDW 16.3* 15.9* 16.1*  PLT 153 147* 141*    Cardiac EnzymesNo results for input(s): TROPONINI in the last 168 hours. No results for input(s): TROPIPOC in the last 168 hours.   BNPNo results for input(s): BNP, PROBNP in the last 168 hours.   DDimer  Recent Labs  Lab 05/18/20 [phone] 05/19/20 0502 05/20/20  0141  DDIMER 2.05* 2.85* 2.45*     Radiology    No results found.  Telemetry    05/20/20 AFL with rates in the 100s to 150s, currently 110s Personally Reviewed  ECG    No new tracing as of 05/20/20- Personally Reviewed  Cardiac Studies   Echocardiogram 2016: - Left ventricle: The cavity size was normal. Systolic function was  normal. The estimated ejection fraction was in the range of 60%  to 65%. Wall motion was normal; there were no regional wall  motion abnormalities. Left ventricular diastolic function  parameters were normal.  - Left atrium: The atrium was normal in size.  - Right ventricle: Systolic function was grossly normal.  - Pulmonary arteries: Systolic pressure was within the normal  range.   Impressions:   - Challenging image quality secondary to body habitus. Grossly a  normal study.  Patient Profile     75 y.o. male with a hx of non-obstructive CAD, HTN, DM type 2, CKD stage 2, SCLC s/p XRT and  recently dx atrial fibrillation/atrial flutter not on AC due to hx of thrombocytopenia who is being seen today for the evaluation of atrial fibrillation with RVR at the request of Dr. Kyung Bacca  Assessment & Plan    1. Paroxsymal atrial flutter/fibrillation with RVR: Initially diagnosed with atrial flutter 03/12/2020 after hospitalization for symptomatic anemia, initially treated with IV diltiazem which was ultimately transitioned to Toprol XL 75 mg daily. Unfortunately, he was seen several weeks later during the hospitalization for recurrent atrial flutter at which time diltiazem CD 240 mg daily was added to his regimen after heart rate stabilization.  Hx of small cell lung CA s/p XRT and chemotherapy with a history of symptomatic anemia and thrombocytopenia has excluded him from long-term anticoagulation in the pastFound to be in AF with RVR on ED presentation with hypoxic and tachypnea Found to be COVID-19 positive -HRs were initially somewhat controlled with IV diltiazem pushes which was then transitioned to PTA diltiazem CD 240 mg. Due to persistently elevated rates, this was transitioned to diltiazem CD 300 mg p.o. daily 05/19/20 -Add metoprolol 25mg  PO BID and follow response -CHA2DS2-VASc Score =4.  Started heparin gtt, can transition to Eliquis 5 mg BID if continues to tolerate AC and no procedures planned  2.  COVID-19 with acute hypoxic respiratory failure: -Patient presented to the ED with shortness of breath found to be COVID-19 PCR positive on 05/16/2019.   -Treated with IV Solu-Medrol and remdesivir per internal medicine.   -He is maintaining adequate O2 saturations on 3 L nasal cannula -Inflammatory markers trending down -Management per primary team  3. Non-obstructive CAD: -Per [facility] in 2013 after presenting with chest pain found to have a 20% RCA stenosis.   -No recent anginal symptoms.   -EKG without ischemic change  -Continue statin  4. HTN: -Stable,  134/98>>145/95>>116/94 -Continue current regimen   5. HLD: -Last LDL 80 05/21/2018 with LDL goal <70 given CAD  -Continue statin   6.  CKD stage II: -Creatinine base, 1.31 today>>>up from 1.12 -Monitor Baseline appears to be in the 1.2-1.5 range  For questions or updates, please contact   Please consult [email or web address] for contact info under Cardiology/STEMI.   Donato Heinz, MD

## 2020-05-20 NOTE — Progress Notes (Signed)
PROGRESS NOTE  [name] HYW:[id number] DOB: [birth year] DOA: 05/14/2020 PCP: [name], MD   LOS: 6 days   Brief narrative:  [name] is a 75 y.o. male with medical history significant for small cell carcinoma of the right lung, on chemotherapy and radiation ,associated with right pleural effusion, paroxysmal atrial fibrillation not on anticoagulation, type 2 diabetes mellitus, stage IIIa chronic kidney disease with baseline creatinine 1.2-1.5, chronic anemia with baseline hemoglobin 7-9  presented to the hospital with 4-day history of shortness of breath cough generalized weakness.  There was no history of orthopnea PND.  Patient has received vaccinations for Covid.  In the ED, patient was slightly tachypneic and was noted to be hypoxic with pulse ox of 88% on room air.  Patient was given supplemental oxygen.  He also received IV Cardizem for atrial flutter.  Procalcitonin was less than 0.10.  COVID-19 PCR in the ED was positive..  Chest x-ray with unchanged partially loculated moderate/large right pleural effusion with diffuse reticular opacities in the perihilar region, also unchanged, and a small left pleural effusion, in the absence of any evidence of pneumothorax.  CTA of the chest was then performed without evidence of pulmonary embolism and moderate sized known partially loculated right pleural effusion, with slight increase in size relative to that on prior CTA chest.  Progressive right perihilar consolidation with occlusion of the right middle and lower lobe bronchi relative to compressive atelectasis observed in the right lower lobe on prior CTA, and also showed no interval change in right upper lobe cavitary lesion.   Patient was then admitted to hospital for further evaluation and treatment.  After hospitalization, patient's breathing status has overall stabilized, currently on 3 L of oxygen by nasal cannula, but his heart rate has been uncontrolled.  Cardiology has been  consulted.    Assessment/Plan:  Principal Problem:   COVID-19 virus infection Active Problems:   Atrial fibrillation with RVR (HCC)   SOB (shortness of breath)   Generalized weakness   Pleural effusion, right   DM2 (diabetes mellitus, type 2) ([place])   Hyperlipidemia   Severe COVID-19 infection with acute hypoxic respiratory failure: COVID-19 PCR positive on 03/24/2020.  Continue IV Solu-Medrol.  Patient has completed IV remdesivir.  Currently on 3 L of oxygen by nasal cannula.  Wean as able.  Inflammatory markers overall trending down except for slightly increased CRP today.  Continue flutter valve, incentive spirometry, albuterol inhaler.  COVID-19 Labs  Recent Labs    05/18/20 0531 05/19/20 0502 05/20/20 0141  DDIMER 2.05* 2.85* 2.45*  FERRITIN 1,136* 1,054* 547*  CRP 1.3* 1.1* 5.9*    Lab Results  Component Value Date   SARSCOV2NAA POSITIVE (A) 05/14/2020   SARSCOV2NAA Not Detected 05/13/2020   SARSCOV2NAA NEGATIVE 03/24/2020   [place] NEGATIVE 03/11/2020    Atrial fibrillation with RVR: On  IV metoprolol, oral metoprolol has been started today.  Continue p.o. Cardizem.  Patient has been started on heparin drip after discussion with cardiology.  Cardiology on board.  We will continue to monitor closely.  Heart rate up to 150 today.  CHA2DS2-VASc score of 4, will benefit from anticoagulation as outpatient.  Patient was not on anticoagulant in the past likely secondary to pancytopenia from chemotherapy..  Generalized weakness:  Likely secondary to severe COVID-19 infection.  Considering home PT on discharge  Type 2 diabetes mellitus:  Hemoglobin A1c of 8.8%.  Continue insulin regimen including Lantus, mealtime insulin and sliding scale insulin at this time.  Diabetic  coordinator on board.  Continue linagliptin, was initiated during this admission.  Hyperlipidemia: Continue Lipitor LFTs within normal limits  Stage IIIa chronic kidney disease:  baseline  creatinine range 1.2-1.5, at baseline at this time.  Continue to monitor closely.  Creatinine of 1.1 today.  Mild hypokalemia.  Improved.  Continue to replace as necessary.  Mild hypomagnesemia.  Improved, continue to replenish orally.  Anemia of chronic disease.  baseline hemoglobin range of 7-9.  Ferritin elevated in the context of Covid illness.  Folate level within normal limits TIBC and saturation low. Hemoglobin of 9.4.  DVT prophylaxis: SCDs Start: 05/14/20 1950   Code Status: Full code  Family Communication:  None today.  I  spoke with the patient's grandson on the phone Mr Kevan Ny and updated him about the clinical condition of the patient yesterday.  Status is: Inpatient  Remains inpatient appropriate because:IV treatments appropriate due to intensity of illness or inability to take PO and Inpatient level of care appropriate due to severity of illness, acute hypoxic respiratory failure, A. fib with RVR requiring cardiology consultation  Dispo: The patient is from: Home              Anticipated d/c is to: Home              Anticipated d/c date is: 2 days              Patient currently is not medically stable to d/c.   Difficult to place patient No  Consultants:  Cardiology  Procedures:  None  Anti-infectives:  . Remdesivir 2/2>2/6  Subjective: Today, patient was seen and examined at bedside.  Denies any chest pain, dizziness, lightheadedness or increasing shortness of breath.  Patient was noted to have a RVR with heart rate of 150s   Objective: Vitals:   05/20/20 0200 05/20/20 0325  BP: (!) 134/98   Pulse: (!) 130 (!) 143  Resp: 18   Temp: 97.6 F (36.4 C)   SpO2: 100%     Intake/Output Summary (Last 24 hours) at 05/20/2020 1246 Last data filed at 05/20/2020 0258 Gross per 24 hour  Intake 360 ml  Output 300 ml  Net 60 ml   Filed Weights   05/16/20 0407 05/18/20 0500 05/20/20 0308  Weight: 89 kg 87.9 kg 90.3 kg   Body mass index is 27.77 kg/m.    Physical Exam: General:  Average built, not in obvious distress nasal cannula oxygen 3 L/min HENT:   No scleral pallor or icterus noted. Oral mucosa is moist.  Chest:   Diminished breath sounds bilaterally. CVS: S1 &S2 heard. No murmur.  Irregularly irregular rhythm.  Tachycardia.  Chest wall port in place. Abdomen: Soft, nontender, nondistended.  Bowel sounds are heard.  External urinary catheter in place. Extremities: No cyanosis, clubbing but trace pedal edema, peripheral pulses are palpable. Psych: Alert, awake and oriented, normal mood CNS:  No cranial nerve deficits.  Power equal in all extremities.   Skin: Warm and dry.  No rashes noted..  Data Review: I have personally reviewed the following laboratory data and studies,  CBC: Recent Labs  Lab 05/16/20 0418 05/17/20 0430 05/18/20 0531 05/19/20 0502 05/20/20 0141  WBC 6.9 9.3 8.4 8.5 7.9  NEUTROABS 6.6 8.9* 7.9* 8.0* 7.4  HGB 7.7* 8.6* 8.9* 8.9* 9.4*  HCT 24.7* 27.7* 28.3* 28.5* 30.7*  MCV 96.9 97.9 96.6 97.3 99.4  PLT 162 180 153 147* 732*   Basic Metabolic Panel: Recent Labs  Lab 05/15/20 0452 05/16/20 0418  05/17/20 0430 05/18/20 0531 05/19/20 0502 05/20/20 0141  NA 136 137 134* 141 139 139  K 4.0 4.0 3.6 3.5 3.2* 3.9  CL 98 99 98 99 97* 99  CO2 25 27 28  32 31 29  GLUCOSE 304* 364* 168* 126* 110* 58*  BUN 21 34* 37* 40* 41* 44*  CREATININE 1.22 1.55* 1.24 1.17 1.12 1.31*  CALCIUM 8.7* 9.0 8.6* 8.6* 8.5* 8.7*  MG 1.9 1.7 1.7 1.7 1.6* 1.9  PHOS 3.9  --   --   --   --   --    Liver Function Tests: Recent Labs  Lab 05/16/20 0418 05/17/20 0430 05/18/20 0531 05/19/20 0502 05/20/20 0141  AST 9* 9* 15 24 19   ALT 12 11 14 31 30   ALKPHOS 86 81 76 88 86  BILITOT 0.5 0.5 0.5 0.6 0.8  PROT 6.8 7.0 6.8 6.2* 6.2*  ALBUMIN 2.8* 2.8* 3.0* 2.7* 2.8*   No results for input(s): LIPASE, AMYLASE in the last 168 hours. No results for input(s): AMMONIA in the last 168 hours. Cardiac Enzymes: No results for  input(s): CKTOTAL, CKMB, CKMBINDEX, TROPONINI in the last 168 hours. BNP (last 3 results) Recent Labs    03/24/20 1426  BNP 537.5*    ProBNP (last 3 results) No results for input(s): PROBNP in the last 8760 hours.  CBG: Recent Labs  Lab 05/19/20 1641 05/19/20 2003 05/20/20 0815 05/20/20 1113 05/20/20 1148  GLUCAP 177* 171* 54* 175* 200*   Recent Results (from the past 240 hour(s))  Novel Coronavirus, NAA ([facility])     Status: None   Collection Time: 05/13/20 12:00 AM   Specimen: Oropharyngeal(OP) collection in vial transport medium   Oropharyngea  Result Value Ref Range Status   SARS-CoV-2, NAA Not Detected Not Detected Final    Comment: This nucleic acid amplification test was developed and its performance characteristics determined by [employer]. Nucleic acid amplification tests include RT-PCR and TMA. This test has not been FDA cleared or approved. This test has been authorized by FDA under an Emergency Use Authorization (EUA). This test is only authorized for the duration of time the declaration that circumstances exist justifying the authorization of the emergency use of in vitro diagnostic tests for detection of SARS-CoV-2 virus and/or diagnosis of COVID-19 infection under section 564(b)(1) of the Act, 21 U.S.C. 672CNO-7(S) (1), unless the authorization is terminated or revoked sooner. When diagnostic testing is negative, the possibility of a false negative result should be considered in the context of a patient's recent exposures and the presence of clinical signs and symptoms consistent with COVID-19. An individual without symptoms of COVID-19 and who is not shedding SARS-CoV-2 virus wo uld expect to have a negative (not detected) result in this assay.   SARS-COV-2, NAA 2 DAY TAT     Status: None   Collection Time: 05/13/20 12:00 AM   Oropharyngea  Result Value Ref Range Status   SARS-CoV-2, NAA 2 DAY TAT Performed  Final  Culture, blood (routine x  2)     Status: None   Collection Time: 05/14/20  5:10 PM   Specimen: BLOOD  Result Value Ref Range Status   Specimen Description   Final    BLOOD PORTA CATH Performed at [facility] [address], [place], [place] [address]    Special Requests   Final    BOTTLES DRAWN AEROBIC AND ANAEROBIC Blood Culture adequate volume Performed at [facility] [address] [place], [place] [address]  Culture   Final    NO GROWTH 5 DAYS Performed at [facility], [place] [address], [place], [place] [address]    Report Status 05/19/2020 FINAL  Final  SARS Coronavirus 2 by RT PCR (hospital order, performed in [facility] hospital lab) Nasopharyngeal Nasopharyngeal Swab     Status: Abnormal   Collection Time: 05/14/20  5:10 PM   Specimen: Nasopharyngeal Swab  Result Value Ref Range Status   SARS Coronavirus 2 POSITIVE (A) NEGATIVE Final    Comment: RESULT CALLED TO, READ BACK BY AND VERIFIED WITH: BROOKS,B. RN @1856  ON 02.02.2022 BY COHEN,K (NOTE) SARS-CoV-2 target nucleic acids are DETECTED  SARS-CoV-2 RNA is generally detectable in upper respiratory specimens  during the acute phase of infection.  Positive results are indicative  of the presence of the identified virus, but do not rule out bacterial infection or co-infection with other pathogens not detected by the test.  Clinical correlation with patient history and  other diagnostic information is necessary to determine patient infection status.  The expected result is negative.  Fact Sheet for Patients:   StrictlyIdeas.no   Fact Sheet for Healthcare Providers:   BankingDealers.co.za    This test is not yet approved or cleared by the [place] FDA and  has been authorized for detection and/or diagnosis of SARS-CoV-2 by FDA under an Emergency Use Authorization (EUA).  This EUA will remain in effect (meanin g this test can be used) for the  duration of  the COVID-19 declaration under Section 564(b)(1) of the Act, 21 U.S.C. section 360-bbb-3(b)(1), unless the authorization is terminated or revoked sooner.  Performed at [name], [place] [address] [place], [place] [address]      Studies: No results found.    Flora Lipps, MD  [facility] 05/20/2020  If 7PM-7AM, please contact night-coverage

## 2020-05-21 ENCOUNTER — Ambulatory Visit: Payer: Medicare Other | Attending: Radiation Oncology

## 2020-05-21 DIAGNOSIS — R531 Weakness: Secondary | ICD-10-CM | POA: Diagnosis not present

## 2020-05-21 DIAGNOSIS — U071 COVID-19: Secondary | ICD-10-CM | POA: Diagnosis not present

## 2020-05-21 DIAGNOSIS — I4891 Unspecified atrial fibrillation: Secondary | ICD-10-CM | POA: Diagnosis not present

## 2020-05-21 DIAGNOSIS — E119 Type 2 diabetes mellitus without complications: Secondary | ICD-10-CM | POA: Diagnosis not present

## 2020-05-21 LAB — COMPREHENSIVE METABOLIC PANEL
ALT: 37 U/L (ref 0–44)
AST: 20 U/L (ref 15–41)
Albumin: 2.7 g/dL — ABNORMAL LOW (ref 3.5–5.0)
Alkaline Phosphatase: 96 U/L (ref 38–126)
Anion gap: 10 (ref 5–15)
BUN: 43 mg/dL — ABNORMAL HIGH (ref 8–23)
CO2: 32 mmol/L (ref 22–32)
Calcium: 8.8 mg/dL — ABNORMAL LOW (ref 8.9–10.3)
Chloride: 100 mmol/L (ref 98–111)
Creatinine, Ser: 1.14 mg/dL (ref 0.61–1.24)
GFR, Estimated: >60 mL/min (ref >60)
Glucose, Bld: 97 mg/dL (ref 70–99)
Potassium: 5 mmol/L (ref 3.5–5.1)
Sodium: 142 mmol/L (ref 135–145)
Total Bilirubin: 0.5 mg/dL (ref 0.3–1.2)
Total Protein: 6.3 g/dL — ABNORMAL LOW (ref 6.5–8.1)

## 2020-05-21 LAB — CBC
HCT: 31.2 % — ABNORMAL LOW (ref 39.0–52.0)
Hemoglobin: 9.4 g/dL — ABNORMAL LOW (ref 13.0–17.0)
MCH: 30.6 pg (ref 26.0–34.0)
MCHC: 30.1 g/dL (ref 30.0–36.0)
MCV: 101.6 fL — ABNORMAL HIGH (ref 80.0–100.0)
Platelets: 125 10*3/uL — ABNORMAL LOW (ref 150–400)
RBC: 3.07 MIL/uL — ABNORMAL LOW (ref 4.22–5.81)
RDW: 16.3 % — ABNORMAL HIGH (ref 11.5–15.5)
WBC: 10.6 10*3/uL — ABNORMAL HIGH (ref 4.0–10.5)
nRBC: 0.2 % (ref 0.0–0.2)

## 2020-05-21 LAB — PHOSPHORUS: Phosphorus: 2.9 mg/dL (ref 2.5–4.6)

## 2020-05-21 LAB — HEPARIN LEVEL (UNFRACTIONATED): Heparin Unfractionated: 0.26 IU/mL — ABNORMAL LOW (ref 0.30–0.70)

## 2020-05-21 LAB — MAGNESIUM: Magnesium: 2.1 mg/dL (ref 1.7–2.4)

## 2020-05-21 LAB — GLUCOSE, CAPILLARY
Glucose-Capillary: 114 mg/dL — ABNORMAL HIGH (ref 70–99)
Glucose-Capillary: 231 mg/dL — ABNORMAL HIGH (ref 70–99)
Glucose-Capillary: 360 mg/dL — ABNORMAL HIGH (ref 70–99)

## 2020-05-21 MED ORDER — METOPROLOL TARTRATE 25 MG PO TABS
37.5000 mg | ORAL_TABLET | Freq: Two times a day (BID) | ORAL | Status: DC
  Administered 2020-05-21: 37.5 mg via ORAL
  Filled 2020-05-21: qty 2

## 2020-05-21 MED ORDER — LINAGLIPTIN 5 MG PO TABS
5.0000 mg | ORAL_TABLET | Freq: Every day | ORAL | 2 refills | Status: AC
Start: 2020-05-22 — End: ?

## 2020-05-21 MED ORDER — APIXABAN 5 MG PO TABS: 5.0000 mg | ORAL_TABLET | Freq: Two times a day (BID) | ORAL | 2 refills | Status: AC

## 2020-05-21 MED ORDER — DILTIAZEM HCL ER COATED BEADS 300 MG PO CP24: 300.0000 mg | ORAL_CAPSULE | Freq: Every day | ORAL | 2 refills | Status: AC

## 2020-05-21 MED ORDER — METOPROLOL TARTRATE 37.5 MG PO TABS: 37.5000 mg | ORAL_TABLET | Freq: Two times a day (BID) | ORAL | 2 refills | Status: AC

## 2020-05-21 MED ORDER — ATORVASTATIN CALCIUM 20 MG PO TABS: 20.0000 mg | ORAL_TABLET | Freq: Every day | ORAL | 2 refills | Status: AC

## 2020-05-21 MED ORDER — APIXABAN 5 MG PO TABS
5.0000 mg | ORAL_TABLET | Freq: Two times a day (BID) | ORAL | Status: DC
  Administered 2020-05-21: 5 mg via ORAL
  Filled 2020-05-21: qty 1

## 2020-05-21 MED ORDER — HEPARIN SOD (PORK) LOCK FLUSH 100 UNIT/ML IV SOLN
500.0000 [IU] | INTRAVENOUS | Status: AC | PRN
  Administered 2020-05-21: 500 [IU]
  Filled 2020-05-21: qty 5

## 2020-05-21 MED ORDER — PREDNISONE 10 MG PO TABS: 40.0000 mg | ORAL_TABLET | Freq: Every day | ORAL | 0 refills | Status: AC

## 2020-05-21 NOTE — Progress Notes (Signed)
[place] for IV heparin Indication: atrial fibrillation  Allergies  Allergen Reactions  . Tetracycline Itching and Rash   Patient Measurements: Height: 5\' 11"  (180.3 cm) Weight: 90.3 kg (199 lb 1.2 oz) IBW/kg (Calculated) : 75.3 Heparin Dosing Weight: TBW  Vital Signs: Temp: 97.9 F (36.6 C) (02/08 2238) Temp Source: Oral (02/08 2238) BP: 91/76 (02/08 2238) Pulse Rate: 120 (02/08 2238)  Labs: Recent Labs    05/19/20 0502 05/20/20 0141 05/20/20 1130 05/21/20 0434  HGB 8.9* 9.4*  --  9.4*  HCT 28.5* 30.7*  --  31.2*  PLT 147* 141*  --  125*  HEPARINUNFRC  --  0.53 0.38 0.26*  CREATININE 1.12 1.31*  --  1.14   Estimated Creatinine Clearance: 60.5 mL/min (by C-G formula based on SCr of 1.14 mg/dL).  Medical History: Past Medical History:  Diagnosis Date  . Allergic rhinitis due to pollen   . Chronic kidney disease, stage III (moderate) ([place]) 12/19/2012  . Depression   . Diabetes mellitus ([place])   . Diverticulosis   . Erectile dysfunction associated with type 2 diabetes mellitus ([place]) 05/28/2015  . Former very heavy cigarette smoker (more than 40 per day) 10/07/2014  . GERD (gastroesophageal reflux disease)   . Gout   . Hyperlipidemia   . Hypertension   . Hypogonadism male 06/14/2012  . Memory loss   . Osteoarthritis   . Personal history of colonic polyps    1996  . Small cell carcinoma of right lung ([place]) 10/23/2019   Medications:  Medications Prior to Admission  Medication Sig Dispense Refill Last Dose  . acetaminophen (TYLENOL) 650 MG CR tablet Take 650 mg by mouth every 8 (eight) hours as needed for pain.   05/13/2020 at Unknown time  . albuterol (VENTOLIN HFA) 108 (90 Base) MCG/ACT inhaler Inhale 1-2 puffs into the lungs every 6 (six) hours as needed for wheezing or shortness of breath. 8 g 2 05/14/2020 at Unknown time  . Ascorbic Acid (VITAMIN C) 1000 MG tablet Take 1,000 mg by mouth daily.   05/14/2020 at Unknown time  .  cefdinir (OMNICEF) 300 MG capsule Take 2 capsules (600 mg total) by mouth daily for 10 days. 20 capsule 0 05/14/2020 at Unknown time  . cholecalciferol (VITAMIN D) 1000 UNITS tablet Take 2,000 Units by mouth daily.   05/14/2020 at Unknown time  . diltiazem (CARDIZEM CD) 240 MG 24 hr capsule Take 1 capsule (240 mg total) by mouth daily. 30 capsule 2 05/14/2020 at Unknown time  . donepezil (ARICEPT) 10 MG tablet TAKE 1 TABLET BY MOUTH AT BEDTIME 90 tablet 3 05/13/2020 at Unknown time  . fluticasone (FLONASE) 50 MCG/ACT nasal spray Place 1 spray into both nostrils daily as needed for allergies.   Past Week at Unknown time  . guaiFENesin (MUCINEX) 600 MG 12 hr tablet Take 600 mg by mouth daily as needed for cough.   05/14/2020 at Unknown time  . insulin regular human CONCENTRATED (HUMULIN R U-500 KWIKPEN) 500 UNIT/ML kwikpen Inject 20 Units into the skin with breakfast, with lunch, and with evening meal.   05/14/2020 at Unknown time  . memantine (NAMENDA) 10 MG tablet TAKE 1 TABLET BY MOUTH TWICE A DAY 180 tablet 3 05/14/2020 at Unknown time  . montelukast (SINGULAIR) 10 MG tablet Take 1 tablet (10 mg total) by mouth at bedtime. 30 tablet 3 05/13/2020 at Unknown time  . pantoprazole (PROTONIX) 20 MG tablet Take 1 tablet (20 mg total) by mouth daily.  30 tablet 2 05/14/2020 at Unknown time  . simvastatin (ZOCOR) 40 MG tablet TAKE 1 TABLET BY MOUTH AT BEDTIME 90 tablet 2 05/13/2020 at Unknown time  . venlafaxine XR (EFFEXOR-XR) 75 MG 24 hr capsule TAKE 1 CAPSULE BY MOUTH EVERY MORNING AND 2 CAPSULES AT BEDTIME (Patient taking differently: Take 75-150 mg by mouth in the morning and at bedtime. TAKE 1 CAPSULE BY MOUTH EVERY MORNING AND 2 CAPSULES AT BEDTIME) 270 capsule 1 05/14/2020 at Unknown time  . Hyprom-Naphaz-Polysorb-Zn Sulf (CLEAR EYES COMPLETE OP) Place 1 drop into both eyes daily as needed (dry eyes).   unknown  . Insulin Pen Needle (PEN NEEDLES) 31G X 5 MM MISC 1 each by Does not apply route 3 (three) times daily. To  inject insulin E11.42 90 each 2   . predniSONE (DELTASONE) 10 MG tablet 2 tabs po for 3 days, then 1 tablet po for 5 days 11 tablet 0   . prochlorperazine (COMPAZINE) 10 MG tablet Take 1 tablet (10 mg total) by mouth every 6 (six) hours as needed. (Patient not taking: Reported on 05/14/2020) 30 tablet 2 Completed Course at Unknown time   Scheduled:  . atorvastatin  20 mg Oral QHS  . Chlorhexidine Gluconate Cloth  6 each Topical Daily  . diltiazem  300 mg Oral Daily  . donepezil  10 mg Oral QHS  . insulin aspart  0-20 Units Subcutaneous TID WC  . insulin aspart  0-5 Units Subcutaneous QHS  . insulin aspart  5 Units Subcutaneous TID WC  . insulin glargine  20 Units Subcutaneous Daily  . Ipratropium-Albuterol  1 puff Inhalation TID  . linagliptin  5 mg Oral Daily  . magnesium oxide  400 mg Oral BID  . mouth rinse  15 mL Mouth Rinse BID  . memantine  10 mg Oral BID  . methylPREDNISolone (SOLU-MEDROL) injection  40 mg Intravenous Q12H  . metoprolol tartrate  25 mg Oral BID  . montelukast  10 mg Oral QHS   Infusions:  . heparin 1,300 Units/hr (05/21/20 0149)    Assessment: 51 yoM with PMH non-obstructive CAD, HTN, DM2, CKD2, SCLC s/p chemo/rads and recently dx atrial fibrillation/atrial flutter previously not on AC due to hx of anemia/thrombocytopenia (during chemo). Now again with episodes of PAF with RVR. Seen by Cardiology, agreeable to proceed with heparin as CBC improved with completion of chemo; Pharmacy consulted to dose.   Baseline INR 1.3; aPTT not done  Prior anticoagulation: Lovenox 40 mg daily for VTE ppx; last dose 2/6 at 930p  Significant events:  Today, 05/21/2020: HL 0.26 subtherapeutic on 1300 units/hr Hgb 9.4 stable plts down to 125 Scr improved to 1.14 No bleeding or line issues noted  Goal of Therapy: Heparin level 0.3-0.7 units/ml Monitor platelets by anticoagulation protocol: Yes  Plan:  Increase heparin drip to 1450 units/hr  Heparin level in 8  hours  Daily CBC, daily heparin level   Monitor for signs of bleeding or thrombosis  Dolly Rias RPh 05/21/2020, 5:55 AM

## 2020-05-21 NOTE — Progress Notes (Signed)
Physical Therapy Treatment Patient Details Name: [name] [name]: [id number] DOB: 1945-09-18 Today's Date: 05/21/2020    History of Present Illness 75 y.o. male with medical history significant for small cell carcinoma of the right lung on chemotherapy and radiation associated with right pleural effusion, paroxysmal atrial fibrillation not anticoagulated chronically, hyperlipidemia, type 2 diabetes mellitus, stage IIIa chronic kidney disease with baseline creatinine 1.2-1.5, chronic anemia with baseline hemoglobin 7-9 who is admitted to [facility] on 05/14/2020 with acute hypoxic respiratory distress in the setting of severe COVID-19 infection    PT Comments    Patient made good progress with acute PT today and initiated gait training with RW. Patient required min assist for walker management and cues for positioning, he ambulated ~15' with mx HR of 122 bpm. Patient desaturated on RA to 85% and improved to 100% with 2L/min. He required seated rest breaks between multiple sit<>stands from recliner with min guard for safety. Acute PT will continue to progress mobility as able. Recommend HHPT follow up.   Follow Up Recommendations  Home health PT     Equipment Recommendations  None recommended by PT    Recommendations for Other Services       Precautions / Restrictions Precautions Precautions: Fall Precaution Comments: denies falls in past 1 year but has been primarily a wheelchair user for ~ 6 months Restrictions Weight Bearing Restrictions: No    Mobility  Bed Mobility Overal bed mobility: Modified Independent             General bed mobility comments: pt OOB in recliner  Transfers Overall transfer level: Needs assistance Equipment used: Rolling walker (2 wheeled) Transfers: Sit to/from Stand Sit to Stand: Min guard         General transfer comment: cues for safe hand placement for power up to RW. cues for safe reach back to recliner. HR stable in100'-110's  with transfer.  Ambulation/Gait Ambulation/Gait assistance: Min guard;Min assist Gait Distance (Feet): 15 Feet Assistive device: Rolling walker (2 wheeled) Gait Pattern/deviations: Step-through pattern;Decreased stride length;Shuffle     General Gait Details: pt with short steps and decreased hip/knee flexion. no overt LOB noted throughout, min guard/assist intermittently for walker management. HR reached max of 122 bpm. Pt ambulated on RA and initially sats 94% however dropped to low of 85%. improved to 100% with 2L/min.   Stairs             Wheelchair Mobility    Modified Rankin (Stroke Patients Only)       Balance Overall balance assessment: Needs assistance   Sitting balance-Leahy Scale: Good     Standing balance support: Bilateral upper extremity supported Standing balance-Leahy Scale: Fair Standing balance comment: reliant on UE support for gait                            Cognition Arousal/Alertness: Awake/alert Behavior During Therapy: WFL for tasks assessed/performed Overall Cognitive Status: Within Functional Limits for tasks assessed                                 General Comments: A&Ox4, pt wants to get home and reports feeling better today      Exercises      General Comments        Pertinent Vitals/Pain Pain Assessment: No/denies pain    Home Living  Prior Function            PT Goals (current goals can now be found in the care plan section) Acute Rehab PT Goals Patient Stated Goal: to be able to walk PT Goal Formulation: With patient Time For Goal Achievement: 05/29/20 Potential to Achieve Goals: Good Progress towards PT goals: Progressing toward goals    Frequency    Min 3X/week      PT Plan Current plan remains appropriate    Co-evaluation              AM-PAC PT "6 Clicks" Mobility   Outcome Measure  Help needed turning from your back to your side while in  a flat bed without using bedrails?: A Little Help needed moving from lying on your back to sitting on the side of a flat bed without using bedrails?: A Little Help needed moving to and from a bed to a chair (including a wheelchair)?: A Little Help needed standing up from a chair using your arms (e.g., wheelchair or bedside chair)?: A Little Help needed to walk in hospital room?: A Little Help needed climbing 3-5 steps with a railing? : A Lot 6 Click Score: 17    End of Session Equipment Utilized During Treatment: Gait belt;Oxygen Activity Tolerance: Patient tolerated treatment well Patient left: in chair;with call bell/phone within reach Nurse Communication: Mobility status PT Visit Diagnosis: Difficulty in walking, not elsewhere classified (R26.2)     Time: 2202-5427 PT Time Calculation (min) (ACUTE ONLY): 36 min  Charges:  $Gait Training: 8-22 mins $Therapeutic Activity: 8-22 mins                     [name], DPT Acute Rehabilitation Services Office [phone] Pager [phone]     [name] 05/21/2020, 2:43 PM

## 2020-05-21 NOTE — Plan of Care (Signed)

## 2020-05-21 NOTE — TOC Progression Note (Signed)
Transition of Care [facility]) - Progression Note    Patient Details  Name: [name] [name]: [id number] Date of Birth: 1946-01-03  [facility] CM/SW Contact  Purcell Mouton, RN Phone Number: 05/21/2020, 3:02 PM  Clinical Narrative:    Spoke with pt concerning Home and DME Oxygen.  [employer] was selected for oxygen and [facility] was called to make aware that pt was discharging today.     Expected Discharge Plan: [place] Barriers to Discharge: No Barriers Identified  Expected Discharge Plan and Services Expected Discharge Plan: [place] arrangements for the past 2 months: Single Family Home Expected Discharge Date: 05/21/20                                     Social Determinants of Health (SDOH) Interventions    Readmission Risk Interventions No flowsheet data found.

## 2020-05-21 NOTE — Progress Notes (Signed)
Progress Note  Patient Name: [name] Date of Encounter: 05/21/2020  Primary Cardiologist: Dr. Fransico Him, MD  Subjective   Denies any chest pain or dyspnea  Inpatient Medications    Scheduled Meds: . atorvastatin  20 mg Oral QHS  . Chlorhexidine Gluconate Cloth  6 each Topical Daily  . diltiazem  300 mg Oral Daily  . donepezil  10 mg Oral QHS  . insulin aspart  0-20 Units Subcutaneous TID WC  . insulin aspart  0-5 Units Subcutaneous QHS  . insulin aspart  5 Units Subcutaneous TID WC  . insulin glargine  20 Units Subcutaneous Daily  . Ipratropium-Albuterol  1 puff Inhalation TID  . linagliptin  5 mg Oral Daily  . magnesium oxide  400 mg Oral BID  . mouth rinse  15 mL Mouth Rinse BID  . memantine  10 mg Oral BID  . methylPREDNISolone (SOLU-MEDROL) injection  40 mg Intravenous Q12H  . metoprolol tartrate  25 mg Oral BID  . montelukast  10 mg Oral QHS   Continuous Infusions: . heparin 1,450 Units/hr (05/21/20 0658)   PRN Meds: acetaminophen **OR** acetaminophen, albuterol, doxylamine (Sleep), guaiFENesin, hydrALAZINE, lidocaine, metoprolol tartrate, sodium chloride flush   Vital Signs    Vitals:   05/20/20 0325 05/20/20 1443 05/20/20 2238 05/21/20 0642  BP:  (!) 125/96 91/76 130/83  Pulse: (!) 143 80 (!) 120 97  Resp:  16 18   Temp:  97.8 F (36.6 C) 97.9 F (36.6 C) 97.8 F (36.6 C)  TempSrc:   Oral Oral  SpO2:  99% 100% 100%  Weight:      Height:        Intake/Output Summary (Last 24 hours) at 05/21/2020 0743 Last data filed at 05/21/2020 0644 Gross per 24 hour  Intake 60 ml  Output 900 ml  Net -840 ml   Filed Weights   05/16/20 0407 05/18/20 0500 05/20/20 0308  Weight: 89 kg 87.9 kg 90.3 kg    Physical Exam    General: in no acute distress Neck:no JVD Cardiac:Tachycardic, irregularno murmur  Lungs:Diminished breath sounds XQJ:JHER, nontender Ext:no edema Musculoskeletal: No deformities Skin:no rash Neuro:no focal  abnormalities noted Psych: Normal affect   Labs    Chemistry Recent Labs  Lab 05/19/20 0502 05/20/20 0141 05/21/20 0434  NA 139 139 142  K 3.2* 3.9 5.0  CL 97* 99 100  CO2 31 29 32  GLUCOSE 110* 58* 97  BUN 41* 44* 43*  CREATININE 1.12 1.31* 1.14  CALCIUM 8.5* 8.7* 8.8*  PROT 6.2* 6.2* 6.3*  ALBUMIN 2.7* 2.8* 2.7*  AST 24 19 20   ALT 31 30 37  ALKPHOS 88 86 96  BILITOT 0.6 0.8 0.5  GFRNONAA >60 57* >60  ANIONGAP 11 11 10      Hematology Recent Labs  Lab 05/19/20 0502 05/20/20 0141 05/21/20 0434  WBC 8.5 7.9 10.6*  RBC 2.93* 3.09* 3.07*  HGB 8.9* 9.4* 9.4*  HCT 28.5* 30.7* 31.2*  MCV 97.3 99.4 101.6*  MCH 30.4 30.4 30.6  MCHC 31.2 30.6 30.1  RDW 15.9* 16.1* 16.3*  PLT 147* 141* 125*    Cardiac EnzymesNo results for input(s): TROPONINI in the last 168 hours. No results for input(s): TROPIPOC in the last 168 hours.   BNPNo results for input(s): BNP, PROBNP in the last 168 hours.   DDimer  Recent Labs  Lab 05/18/20 0531 05/19/20 0502 05/20/20 0141  DDIMER 2.05* 2.85* 2.45*     Radiology  No results found.  Telemetry    AF rates 80-110s - Personally Reviewed  ECG    No new tracing as of 05/21/20 - Personally Reviewed  Cardiac Studies    Echocardiogram 2016: - Left ventricle: The cavity size was normal. Systolic function was  normal. The estimated ejection fraction was in the range of 60%  to 65%. Wall motion was normal; there were no regional wall  motion abnormalities. Left ventricular diastolic function  parameters were normal.  - Left atrium: The atrium was normal in size.  - Right ventricle: Systolic function was grossly normal.  - Pulmonary arteries: Systolic pressure was within the normal  range.   Impressions:   - Challenging image quality secondary to body habitus. Grossly a  normal study.  Patient Profile     75 y.o. male with a hx of non-obstructive CAD, HTN, DM type 2, CKD stage2, SCLC s/p XRT andrecently  dx atrial fibrillation/atrial flutter not on AC due to hx of thrombocytopeniawho is being seen today for the evaluation ofatrial fibrillation with RVRat the request of [place]    1.Paroxsymal atrial flutter/fibrillation with RVR: -Initially diagnosed with atrial flutter 12/1/2021after hospitalization for symptomatic anemia, initially treated with IV diltiazem which was ultimately transitioned to Toprol XL 75 mg daily. Unfortunately, he was seen several weeks later during the hospitalization for recurrent atrial flutter at which time diltiazem CD 240 mg daily was added to his regimen after heart rate stabilization.  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  -Found to be in AF with RVR on ED presentation with hypoxic and tachypnea Found to be COVID-19 positive -HRs were initially somewhat controlled with IV diltiazempusheswhich was then transitioned to PTA diltiazem CD 240 mg.Due to persistently elevated rates, this was transitioned to diltiazem CD 300 mg p.o. daily 05/19/20 -Hrs improved but mildly elevated, metoprolol increased to 37.5 BID today  -CHA2DS2-VASc Score =4 -Started heparin gtt, can transition to Eliquis 5 mg BID  2. COVID-19 with acute hypoxic respiratory failure: -Patient presented to the ED with shortness of breath found to be COVID-19 PCR positive on 05/16/2019. -Treated with IV Solu-Medrol and remdesivir per internal medicine.  -He is maintaining adequate O2 saturations on 3 L nasal cannula -Inflammatory markers trending down -Management per primary team  3. Non-obstructive CAD: -Per [facility] in 2013 after presenting with chest pain found to have a 20% RCA stenosis.  -No recent anginal symptoms.  -EKG without ischemic change  -Continue statin  4. HTN: -Stable, 130/83>91/76>125/96>134/98 -Continue current  regimen  5. HLD: -Last LDL 80 05/21/2018 with LDL goal<70given CAD -Continue statin   6. CKD stage II: -Creatinine base,1.14 today -Monitor Baseline appears to be in the 1.2-1.5 range  Signed, Kathyrn Drown NP-C [facility] Pager: [phone] 05/21/2020, 7:43 AM    [facility] will sign off.   Medication Recommendations: Diltiazem 300 mg daily, metoprolol 37.5 mg twice daily, Eliquis 5 mg twice daily Other recommendations (labs, testing, etc): None Follow up as an outpatient: We will schedule follow-up in A. fib clinic in 2 weeks.  If remains in atrial flutter, would plan cardioversion once he completes 3 weeks of anticoagulation  For questions or updates, please contact   Please consult [email or web address] for contact info under Cardiology/STEMI.

## 2020-05-21 NOTE — Progress Notes (Signed)
[place] for Apixaban Indication: atrial fibrillation  Allergies  Allergen Reactions  . Tetracycline Itching and Rash   Patient Measurements: Height: 5\' 11"  (180.3 cm) Weight: 90.3 kg (199 lb 1.2 oz) IBW/kg (Calculated) : 75.3 Heparin Dosing Weight: TBW  Vital Signs: Temp: 97.8 F (36.6 C) (02/09 0642) Temp Source: Oral (02/09 0642) BP: 130/83 (02/09 0642) Pulse Rate: 97 (02/09 0642)  Labs: Recent Labs    05/19/20 0502 05/20/20 0141 05/20/20 1130 05/21/20 0434  HGB 8.9* 9.4*  --  9.4*  HCT 28.5* 30.7*  --  31.2*  PLT 147* 141*  --  125*  HEPARINUNFRC  --  0.53 0.38 0.26*  CREATININE 1.12 1.31*  --  1.14   Estimated Creatinine Clearance: 60.5 mL/min (by C-G formula based on SCr of 1.14 mg/dL).  Medical History: Past Medical History:  Diagnosis Date  . Allergic rhinitis due to pollen   . Chronic kidney disease, stage III (moderate) ([place]) 12/19/2012  . Depression   . Diabetes mellitus ([place])   . Diverticulosis   . Erectile dysfunction associated with type 2 diabetes mellitus ([place]) 05/28/2015  . Former very heavy cigarette smoker (more than 40 per day) 10/07/2014  . GERD (gastroesophageal reflux disease)   . Gout   . Hyperlipidemia   . Hypertension   . Hypogonadism male 06/14/2012  . Memory loss   . Osteoarthritis   . Personal history of colonic polyps    1996  . Small cell carcinoma of right lung ([facility]) 10/23/2019    Infusions:  . heparin 1,450 Units/hr (05/21/20 5465)    Assessment: 75 yoM with PMH non-obstructive CAD, HTN, DM2, CKD2, SCLC s/p chemo/rads and recently dx atrial fibrillation/atrial flutter previously not on AC due to hx of anemia/thrombocytopenia (during chemo). Now again with episodes of PAF with RVR. Seen by Cardiology, agreeable to proceed with heparin as CBC improved with completion of chemo; Pharmacy consulted to dose.   Baseline INR 1.3; aPTT not done  Prior anticoagulation: Lovenox 40 mg daily for  VTE ppx; last dose 2/6 at 930p  Significant events:  Today, 05/21/2020: HL 0.26 subtherapeutic on 1300 units/hr, Increased heparin drip to 1450 units/hr Hgb 9.4 stable plts down to 125 Scr improved to 1.14 No bleeding or line issues noted Transition to Apixaban today  Goal of Therapy: Monitor platelets by anticoagulation protocol: Yes  Plan:  Apixaban 5mg  bid  Monitor for signs of bleeding or thrombosis  Minda Ditto PharmD 05/21/2020, 7:34 AM

## 2020-05-21 NOTE — Plan of Care (Signed)
SATURATION QUALIFICATIONS: (This note is used to comply with regulatory documentation for home oxygen)  Patient Saturations on Room Air at Rest = 90%  Patient Saturations on Room Air while Ambulating = 85%  Patient Saturations on 2 Liters of oxygen while Ambulating = 100%  Please briefly explain why patient needs home oxygen: to maintain a safe oxygen saturation

## 2020-05-21 NOTE — Progress Notes (Signed)
Pt discharged safely with all of his belongings. Home O2 provided along with charger/instructions-reviewed with patient.  Pt was educated on meds & follow-up appointments. AVS provided & all questions concerns addressed with pt & his family member that picked him up. R CW port was de-accessed by IV team (dressing is clean, dry, intact)

## 2020-05-21 NOTE — Progress Notes (Deleted)
[place] for IV heparin Indication: atrial fibrillation  Allergies  Allergen Reactions  . Tetracycline Itching and Rash   Patient Measurements: Height: 5\' 11"  (180.3 cm) Weight: 90.3 kg (199 lb 1.2 oz) IBW/kg (Calculated) : 75.3 Heparin Dosing Weight: TBW  Vital Signs: Temp: 97.8 F (36.6 C) (02/09 0642) Temp Source: Oral (02/09 0642) BP: 130/83 (02/09 0642) Pulse Rate: 97 (02/09 0642)  Labs: Recent Labs    05/19/20 0502 05/20/20 0141 05/20/20 1130 05/21/20 0434  HGB 8.9* 9.4*  --  9.4*  HCT 28.5* 30.7*  --  31.2*  PLT 147* 141*  --  125*  HEPARINUNFRC  --  0.53 0.38 0.26*  CREATININE 1.12 1.31*  --  1.14   Estimated Creatinine Clearance: 60.5 mL/min (by C-G formula based on SCr of 1.14 mg/dL).  Medical History: Past Medical History:  Diagnosis Date  . Allergic rhinitis due to pollen   . Chronic kidney disease, stage III (moderate) ([place]) 12/19/2012  . Depression   . Diabetes mellitus ([place])   . Diverticulosis   . Erectile dysfunction associated with type 2 diabetes mellitus ([place]) 05/28/2015  . Former very heavy cigarette smoker (more than 40 per day) 10/07/2014  . GERD (gastroesophageal reflux disease)   . Gout   . Hyperlipidemia   . Hypertension   . Hypogonadism male 06/14/2012  . Memory loss   . Osteoarthritis   . Personal history of colonic polyps    1996  . Small cell carcinoma of right lung ([facility]) 10/23/2019    Infusions:  . heparin 1,450 Units/hr (05/21/20 7948)    Assessment: 80 yoM with PMH non-obstructive CAD, HTN, DM2, CKD2, SCLC s/p chemo/rads and recently dx atrial fibrillation/atrial flutter previously not on AC due to hx of anemia/thrombocytopenia (during chemo). Now again with episodes of PAF with RVR. Seen by Cardiology, agreeable to proceed with heparin as CBC improved with completion of chemo; Pharmacy consulted to dose.   Baseline INR 1.3; aPTT not done  Prior anticoagulation: Lovenox 40 mg daily for  VTE ppx; last dose 2/6 at 930p  Significant events:  Today, 05/21/2020: HL 0.26 subtherapeutic on 1300 units/hr, Increase heparin drip to 1450 units/hr Hgb 9.4 stable plts down to 125 Scr improved to 1.14 No bleeding or line issues noted  Goal of Therapy: Heparin level 0.3-0.7 units/ml Monitor platelets by anticoagulation protocol: Yes  Plan:    Daily CBC, daily heparin level   Monitor for signs of bleeding or thrombosis  Minda Ditto PharmD 05/21/2020, 7:34 AM   Incorrectly signed, disregard this note  Minda Ditto PharmD 05/21/2020, 7:36 AM

## 2020-05-21 NOTE — Discharge Summary (Signed)
Physician Discharge Summary  [name] OVF:[id number] DOB: 06/09/1945 DOA: 05/14/2020  PCP: [name], MD  Admit date: 05/14/2020 Discharge date: 05/21/2020  Admitted From: Home  Discharge disposition: Home with PT   Recommendations for Outpatient Follow-Up:   . Follow up with your primary care provider in one week.  . Check CBC, BMP, magnesium in the next visit . Follow-up with cardiology clinic for atrial fibrillation follow-up.  Patient might need a DC cardioversion as outpatient after adequate anticoagulation of at least 3 weeks.  Discharge Diagnosis:   Principal Problem:   COVID-19 virus infection Active Problems:   Atrial fibrillation with RVR (HCC)   SOB (shortness of breath)   Generalized weakness   Pleural effusion, right   DM2 (diabetes mellitus, type 2) ([place])   Hyperlipidemia   Discharge Condition: Improved.  Diet recommendation: Low sodium, heart healthy.  Carbohydrate-modified.    Wound care: None.  Code status: Full.   History of Present Illness:   Dustin Hicklin Johnsonis a 75 y.o.malewith medical history significant forsmall cell carcinoma of the right lung, on chemotherapy and radiation ,associated with right pleural effusion, paroxysmal atrial fibrillation not on anticoagulation, type 2 diabetes mellitus, stage IIIa chronic kidney disease with baseline creatinine 1.2-1.5, chronic anemia with baseline hemoglobin 7-9 presented to the hospital with 4-day history of shortness of breath cough generalized weakness.  There was no history of orthopnea PND.  Patient has received vaccinations for Covid.  In the ED, patient was slightly tachypneic and was noted to be hypoxic with pulse ox of 88% on room air.  Patient was given supplemental oxygen.  He also received IV Cardizem for atrial flutter.  Procalcitonin was less than 0.10.  COVID-19 PCR in the ED was positive..  Chest x-ray with unchanged partially loculated moderate/large right pleural effusion with  diffuse reticular opacities in the perihilar region, also unchanged, and a small left pleural effusion, in the absence of any evidence of pneumothorax. CTA of the chest was then performed without evidence of pulmonary embolism and moderate sized known partially loculated right pleural effusion, with slight increase in size relative to that on prior CTA chest.Progressive right perihilar consolidation with occlusion of the right middle and lower lobe bronchi relative to compressive atelectasis observed in the right lower lobe on prior CTA,and also showed no interval change in right upper lobe cavitary lesion.  Patient was then admitted to hospital for further evaluation and treatment.   Hospital Course:   Following conditions were addressed during hospitalization as listed below,  SevereCOVID-19infection with acute hypoxic respiratory failure:  COVID-19 PCR positive on 03/24/2020.    Patient received IV Solu-Medrol during hospitalization.  This will be changed to prednisone for the next 4 days to complete the 10-day course.   Patient has completed IV remdesivir.  Currently on 2 L of oxygen by nasal cannula and will be discharged home on oxygen.  Patient has clinically significantly improved.  Atrial fibrillation with RVR:  Patient was seen by cardiology during hospitalization.  He has been started on increased dose of Cardizem, beta-blockers and anticoagulation.  He was initially on IV heparin which has been transitioned to Eliquis on discharge.  CHA2DS2-VAScscore of 4. Patient was not on anticoagulant in the past likely secondary to pancytopenia from chemotherapy.  Risk versus benefits of the anticoagulation were thoroughly explained to the patient.  Bleeding precautions were explained as well.  [name] stated that he does not have any plans for further chemotherapy.  Generalized weakness: Likely secondary to severe  COVID-19 infection.  Considering home PT on discharge as per PT  recommendations.  Type 2 diabetes mellitus:  Hemoglobin A1c of 8.8%.    Continue linagliptin on discharge which was, was initiated during this admission.  And to resume insulin regimen at home including diabetic diet and close monitoring  Hyperlipidemia: Continue Lipitor, LFTs within normal limits  Stage IIIa chronic kidney disease:  baseline creatinine range 1.2-1.5, at baseline at this time.  Continue to monitor closely.  Creatinine of 1.1 today.  Mild hypokalemia.  Improved.    Mild hypomagnesemia.  Improved  Anemia of chronic disease.  baseline hemoglobin range of 7-9.  Ferritin elevated in the context of Covid illness.  Folate level within normal limits TIBC and saturation low.  Latest hemoglobin of 9.4.  Disposition.  At this time, patient is stable for disposition home with home PT.  Patient will need to follow-up with primary care physician and cardiology as outpatient.  Medical Consultants:   Cardiology Procedures:    None Subjective:   Today, patient was seen and examined at bedside.  Patient denies any chest pain, dizziness, lightheadedness or shortness of breath.  He wishes to go home today  Discharge Exam:   Vitals:   05/21/20 0642 05/21/20 1204  BP: 130/83 111/68  Pulse: 97 98  Resp:  18  Temp: 97.8 F (36.6 C) 97.8 F (36.6 C)  SpO2: 100%    Vitals:   05/20/20 1443 05/20/20 2238 05/21/20 0642 05/21/20 1204  BP: (!) 125/96 91/76 130/83 111/68  Pulse: 80 (!) 120 97 98  Resp: 16 18  18   Temp: 97.8 F (36.6 C) 97.9 F (36.6 C) 97.8 F (36.6 C) 97.8 F (36.6 C)  TempSrc:  Oral Oral Oral  SpO2: 99% 100% 100%   Weight:      Height:       General: Alert awake, not in obvious distress, on oxygen 2 L/min. HENT: pupils equally reacting to light,  No scleral pallor or icterus noted. Oral mucosa is moist.  Chest:   Diminished breath sounds bilaterally.  Chest wall port in place CVS: S1 &S2 heard. No murmur.  Irregularly irregular rhythm. Abdomen:  Soft, nontender, nondistended.  Bowel sounds are heard.   Extremities: No cyanosis, clubbing or edema.  Peripheral pulses are palpable. Psych: Alert, awake and oriented, normal mood CNS:  No cranial nerve deficits.  Power equal in all extremities.   Skin: Warm and dry.  No rashes noted.  The results of significant diagnostics from this hospitalization (including imaging, microbiology, ancillary and laboratory) are listed below for reference.     Diagnostic Studies:   DG Chest 2 View  Result Date: 05/14/2020 CLINICAL DATA:  Worsening shortness of breath for 4 days with productive cough. Active treatment for lung cancer. EXAM: CHEST - 2 VIEW COMPARISON:  Chest radiographs yesterday.  Most recent CT 03/24/2020 FINDINGS: Right chest port remains in place. Moderate to large partially loculated right pleural effusion is not significantly changed from yesterday. Opacities throughout the right hemithorax, confluent in the perihilar region, also unchanged. Cavitary component on prior CT is not well seen by radiograph. Stable volume loss in the right hemithorax. Small left pleural effusion is similar. Background chronic bronchial thickening. Minor atelectasis at the left lung base. No pneumothorax. Stable osseous structures. IMPRESSION: 1. No significant change from yesterday. 2. Unchanged partially loculated moderate to large right pleural effusion. Diffuse right lung opacities, confluent in the perihilar region, patient with known lung cancer. Cavitary component on prior CT is  not well demonstrated. 3. Small left pleural effusion is similar. 4. Chronic bronchial thickening. Electronically Signed   By: Keith Rake M.D.   On: 05/14/2020 14:59   CT Angio Chest PE W and/or Wo Contrast  Result Date: 05/14/2020 CLINICAL DATA:  PE suspected, high prob Progressive shortness of breath for 4 days. Productive cough. Active treatment for lung cancer. EXAM: CT ANGIOGRAPHY CHEST WITH CONTRAST TECHNIQUE: Multidetector  CT imaging of the chest was performed using the standard protocol during bolus administration of intravenous contrast. Multiplanar CT image reconstructions and MIPs were obtained to evaluate the vascular anatomy. CONTRAST:  24mL OMNIPAQUE IOHEXOL 350 MG/ML SOLN COMPARISON:  Radiograph earlier today. Most recent chest CTA 03/24/2020 FINDINGS: Cardiovascular: There are no filling defects within the pulmonary arteries to suggest pulmonary embolus. The right upper lobe pulmonary arteries are attenuated. Aortic atherosclerosis without acute aortic finding. Right chest port with tip in the right atrium. Normal heart size. No pericardial effusion or thickening. Mediastinum/Nodes: 19 mm right lower paratracheal node, series 4, image 58, previously 22 mm. Multiple additional small mediastinal lymph nodes are not enlarged by size criteria. No definite new adenopathy. 12 mm right thyroid nodule. Not clinically significant; no follow-up imaging recommended (ref: J Am Coll Radiol. 2015 Feb;12(2): 143-50).No esophageal wall thickening. Lungs/Pleura: Moderately large partially loculated right pleural effusion. This has increased in size from prior exam with increased atelectasis in the right lower lobe. Right upper lobe cavitary lesion with air-fluid level, slightly increased, currently 5.9 cm, previously 4.6 cm by my retrospective measurement. Exact measurement difficult due to the ill-defined adjacent soft tissue density. Diffuse right perihilar consolidation and soft tissue density, progressed. Ill-defined spiculated densities in the aerated right upper lobe. There is narrowing of the right mainstem bronchus, with complete occlusion of the right middle and lower lobe bronchi, difficult to delineate if this is due to secretions or external compression. Right middle lobe appears completely atelectatic. Moderate left pleural effusion is similar. 2 small left upper lobe pulmonary nodules peripherally measuring 5 and 6 mm, both on  series 6, image 51, not seen on prior. Irregular subpleural density with ill-defined margins in the anterior left upper lobe, series 6, image 77, also new. Breathing motion artifact limits detailed assessment. There is no pneumothorax. Upper Abdomen: Upper abdominal atherosclerosis. Cholecystectomy. No acute upper abdominal findings. Musculoskeletal: No evidence of focal bone lesion. No acute osseous abnormalities are seen. Degenerative change throughout the spine. Review of the MIP images confirms the above findings. IMPRESSION: 1. No pulmonary embolus. 2. Moderately large partially loculated right pleural effusion which has increased in size from prior exam. Progressive right perihilar consolidation and soft tissue density with narrowing of the right mainstem bronchus and complete occlusion of the right middle and lower lobe bronchi. It is unclear if bronchial occlusion is due to retained secretions/mucus or external compression. Right middle lobe is completely atelectatic. 3. Cavitary lesion in the right upper lobe with equivocal increase in size from prior exam. 4. Two small left upper lobe pulmonary nodules measuring 5 and 6 mm, not seen on prior exam, nonspecific. No ill-defined subpleural density in the anterior left upper lobe. Recommend attention at follow-up. 5. Decreased size of right lower paratracheal node. Aortic Atherosclerosis (ICD10-I70.0). Electronically Signed   By: Keith Rake M.D.   On: 05/14/2020 18:27   DG CHEST PORT 1 VIEW  Result Date: 05/15/2020 CLINICAL DATA:  Pleural effusion. EXAM: PORTABLE CHEST 1 VIEW COMPARISON:  CT 05/14/2020.  Chest x-ray 05/14/2020. FINDINGS: PowerPort catheter stable position.  Heart size stable. Right mainstem bronchus may be occluded. Progressive opacification right upper hemithorax most consistent with progressive atelectasis/infiltrate and right pleural effusion. Pleural effusion may be loculated. Diffuse left lung infiltrate/edema noted on today's  exam. Small left pleural effusion also noted. No pneumothorax. IMPRESSION: 1. PowerPort catheter in stable position. 2. Right mainstem bronchus may be occluded. Progressive opacification right upper hemithorax most consistent with progressive atelectasis/infiltrate and right pleural effusion. Pleural effusion may be loculated. 3. Diffuse left lung infiltrate/edema and small left pleural effusion noted on today's exam. Electronically Signed   ByMarcello Moores  Register   On: 05/15/2020 06:51     Labs:   Basic Metabolic Panel: Recent Labs  Lab 05/15/20 7893 05/16/20 0418 05/17/20 0430 05/18/20 0531 05/19/20 0502 05/20/20 0141 05/21/20 0434  NA 136   < > 134* 141 139 139 142  K 4.0   < > 3.6 3.5 3.2* 3.9 5.0  CL 98   < > 98 99 97* 99 100  CO2 25   < > 28 32 31 29 32  GLUCOSE 304*   < > 168* 126* 110* 58* 97  BUN 21   < > 37* 40* 41* 44* 43*  CREATININE 1.22   < > 1.24 1.17 1.12 1.31* 1.14  CALCIUM 8.7*   < > 8.6* 8.6* 8.5* 8.7* 8.8*  MG 1.9   < > 1.7 1.7 1.6* 1.9 2.1  PHOS 3.9  --   --   --   --   --  2.9   < > = values in this interval not displayed.   GFR Estimated Creatinine Clearance: 60.5 mL/min (by C-G formula based on SCr of 1.14 mg/dL). Liver Function Tests: Recent Labs  Lab 05/17/20 0430 05/18/20 0531 05/19/20 0502 05/20/20 0141 05/21/20 0434  AST 9* 15 24 19 20   ALT 11 14 31 30  37  ALKPHOS 81 76 88 86 96  BILITOT 0.5 0.5 0.6 0.8 0.5  PROT 7.0 6.8 6.2* 6.2* 6.3*  ALBUMIN 2.8* 3.0* 2.7* 2.8* 2.7*   No results for input(s): LIPASE, AMYLASE in the last 168 hours. No results for input(s): AMMONIA in the last 168 hours. Coagulation profile Recent Labs  Lab 05/15/20 0452  INR 1.3*    CBC: Recent Labs  Lab 05/16/20 0418 05/17/20 0430 05/18/20 0531 05/19/20 0502 05/20/20 0141 05/21/20 0434  WBC 6.9 9.3 8.4 8.5 7.9 10.6*  NEUTROABS 6.6 8.9* 7.9* 8.0* 7.4  --   HGB 7.7* 8.6* 8.9* 8.9* 9.4* 9.4*  HCT 24.7* 27.7* 28.3* 28.5* 30.7* 31.2*  MCV 96.9 97.9 96.6 97.3  99.4 101.6*  PLT 162 180 153 147* 141* 125*   Cardiac Enzymes: No results for input(s): CKTOTAL, CKMB, CKMBINDEX, TROPONINI in the last 168 hours. BNP: Invalid input(s): POCBNP CBG: Recent Labs  Lab 05/20/20 1148 05/20/20 1652 05/20/20 2103 05/21/20 0747 05/21/20 1109  GLUCAP 200* 288* 338* 114* 231*   D-Dimer Recent Labs    05/19/20 0502 05/20/20 0141  DDIMER 2.85* 2.45*   Hgb A1c No results for input(s): HGBA1C in the last 72 hours. Lipid Profile No results for input(s): CHOL, HDL, LDLCALC, TRIG, CHOLHDL, LDLDIRECT in the last 72 hours. Thyroid function studies No results for input(s): TSH, T4TOTAL, T3FREE, THYROIDAB in the last 72 hours.  Invalid input(s): FREET3 Anemia work up Recent Labs    05/19/20 0502 05/20/20 0141  FERRITIN 1,054* 22*   Microbiology Recent Results (from the past 240 hour(s))  Novel Coronavirus, NAA ([facility])     Status: None  Collection Time: 05/13/20 12:00 AM   Specimen: Oropharyngeal(OP) collection in vial transport medium   Oropharyngea  Result Value Ref Range Status   SARS-CoV-2, NAA Not Detected Not Detected Final    Comment: This nucleic acid amplification test was developed and its performance characteristics determined by [employer]. Nucleic acid amplification tests include RT-PCR and TMA. This test has not been FDA cleared or approved. This test has been authorized by FDA under an Emergency Use Authorization (EUA). This test is only authorized for the duration of time the declaration that circumstances exist justifying the authorization of the emergency use of in vitro diagnostic tests for detection of SARS-CoV-2 virus and/or diagnosis of COVID-19 infection under section 564(b)(1) of the Act, 21 U.S.C. 678LFY-1(O) (1), unless the authorization is terminated or revoked sooner. When diagnostic testing is negative, the possibility of a false negative result should be considered in the context of a patient's recent  exposures and the presence of clinical signs and symptoms consistent with COVID-19. An individual without symptoms of COVID-19 and who is not shedding SARS-CoV-2 virus wo uld expect to have a negative (not detected) result in this assay.   SARS-COV-2, NAA 2 DAY TAT     Status: None   Collection Time: 05/13/20 12:00 AM   Oropharyngea  Result Value Ref Range Status   SARS-CoV-2, NAA 2 DAY TAT Performed  Final  Culture, blood (routine x 2)     Status: None   Collection Time: 05/14/20  5:10 PM   Specimen: BLOOD  Result Value Ref Range Status   Specimen Description   Final    BLOOD PORTA CATH Performed at [facility] [address] [place], [place] [address]    Special Requests   Final    BOTTLES DRAWN AEROBIC AND ANAEROBIC Blood Culture adequate volume Performed at [facility] [address], [place], [place] [address]    Culture   Final    NO GROWTH 5 DAYS Performed at [facility], [place] [address], [place], [place] [address]    Report Status 05/19/2020 FINAL  Final  SARS Coronavirus 2 by RT PCR (hospital order, performed in [facility] hospital lab) Nasopharyngeal Nasopharyngeal Swab     Status: Abnormal   Collection Time: 05/14/20  5:10 PM   Specimen: Nasopharyngeal Swab  Result Value Ref Range Status   SARS Coronavirus 2 POSITIVE (A) NEGATIVE Final    Comment: RESULT CALLED TO, READ BACK BY AND VERIFIED WITH: BROOKS,B. RN @1856  ON 02.02.2022 BY COHEN,K (NOTE) SARS-CoV-2 target nucleic acids are DETECTED  SARS-CoV-2 RNA is generally detectable in upper respiratory specimens  during the acute phase of infection.  Positive results are indicative  of the presence of the identified virus, but do not rule out bacterial infection or co-infection with other pathogens not detected by the test.  Clinical correlation with patient history and  other diagnostic information is necessary to determine patient infection status.  The expected  result is negative.  Fact Sheet for Patients:   StrictlyIdeas.no   Fact Sheet for Healthcare Providers:   BankingDealers.co.za    This test is not yet approved or cleared by the [place] FDA and  has been authorized for detection and/or diagnosis of SARS-CoV-2 by FDA under an Emergency Use Authorization (EUA).  This EUA will remain in effect (meanin g this test can be used) for the duration of  the COVID-19 declaration under Section 564(b)(1) of the Act, 21 U.S.C. section 360-bbb-3(b)(1), unless the authorization  is terminated or revoked sooner.  Performed at [facility], [place] [address], [place], [place] [address]      Discharge Instructions:   Discharge Instructions     Call MD for:  temperature >100.4   Complete by: As directed    Diet - low sodium heart healthy   Complete by: As directed    Diet Carb Modified   Complete by: As directed    Discharge instructions   Complete by: As directed    Follow up with your primary care provider in 1 week. Check blood work at that time. Follow up with cardiology as outpatient.  Take precautions on blood thinner- Eliquis.  Follow-up at the cardiology clinic for your rhythm problem.  Office to follow-up with you.   Face-to-face encounter (required for Medicare/Medicaid patients)   Complete by: As directed    I Latroya Ng certify that this patient is under my care and that I, or a nurse practitioner or physician's assistant working with me, had a face-to-face encounter that meets the physician face-to-face encounter requirements with this patient on 05/21/2020. The encounter with the patient was in whole, or in part for the following medical condition(s) which is the primary reason for home health care-debility, deconditioning, hypoxic respiratory failure, atrial fibrillation, diabetes mellitus, CKD, lung cancer   The encounter with the patient was in whole, or in part,  for the following medical condition, which is the primary reason for home health care: Debility, deconditioning, hypoxic respiratory failure, atrial fibrillation, diabetes mellitus, CKD, lung cancer   I certify that, based on my findings, the following services are medically necessary home health services: Physical therapy   Reason for Medically Necessary Home Health Services: Therapy- Therapeutic Exercises to Increase Strength and Endurance   My clinical findings support the need for the above services:  Unable to leave home safely without assistance and/or assistive device Shortness of breath with activity     Further, I certify that my clinical findings support that this patient is homebound due to: Shortness of Breath with activity   Home Health   Complete by: As directed    To provide the following care/treatments: PT   Increase activity slowly   Complete by: As directed       Allergies as of 05/21/2020       Reactions   Tetracycline Itching, Rash        Medication List     STOP taking these medications    cefdinir 300 MG capsule Commonly known as: OMNICEF   simvastatin 40 MG tablet Commonly known as: ZOCOR       TAKE these medications    acetaminophen 650 MG CR tablet Commonly known as: TYLENOL Take 650 mg by mouth every 8 (eight) hours as needed for pain.   albuterol 108 (90 Base) MCG/ACT inhaler Commonly known as: VENTOLIN HFA Inhale 1-2 puffs into the lungs every 6 (six) hours as needed for wheezing or shortness of breath.   apixaban 5 MG Tabs tablet Commonly known as: ELIQUIS Take 1 tablet (5 mg total) by mouth 2 (two) times daily.   atorvastatin 20 MG tablet Commonly known as: LIPITOR Take 1 tablet (20 mg total) by mouth at bedtime.   cholecalciferol 1000 units tablet Commonly known as: VITAMIN D Take 2,000 Units by mouth daily.   CLEAR EYES COMPLETE OP Place 1 drop into both eyes daily as needed (dry eyes).   diltiazem 300 MG 24 hr  capsule Commonly known as: CARDIZEM CD  Take 1 capsule (300 mg total) by mouth daily. What changed:   medication strength  how much to take   donepezil 10 MG tablet Commonly known as: ARICEPT TAKE 1 TABLET BY MOUTH AT BEDTIME   fluticasone 50 MCG/ACT nasal spray Commonly known as: FLONASE Place 1 spray into both nostrils daily as needed for allergies.   guaiFENesin 600 MG 12 hr tablet Commonly known as: MUCINEX Take 600 mg by mouth daily as needed for cough.   HumuLIN R U-500 KwikPen 500 UNIT/ML kwikpen Generic drug: insulin regular human CONCENTRATED Inject 20 Units into the skin with breakfast, with lunch, and with evening meal.   linagliptin 5 MG Tabs tablet Commonly known as: TRADJENTA Take 1 tablet (5 mg total) by mouth daily. Start taking on: May 22, 2020   memantine 10 MG tablet Commonly known as: NAMENDA TAKE 1 TABLET BY MOUTH TWICE A DAY   Metoprolol Tartrate 37.5 MG Tabs Take 37.5 mg by mouth 2 (two) times daily.   montelukast 10 MG tablet Commonly known as: SINGULAIR Take 1 tablet (10 mg total) by mouth at bedtime.   pantoprazole 20 MG tablet Commonly known as: Protonix Take 1 tablet (20 mg total) by mouth daily.   Pen Needles 31G X 5 MM Misc 1 each by Does not apply route 3 (three) times daily. To inject insulin E11.42   predniSONE 10 MG tablet Commonly known as: DELTASONE Take 4 tablets (40 mg total) by mouth daily with breakfast for 4 days. What changed:   how much to take  how to take this  when to take this  additional instructions   prochlorperazine 10 MG tablet Commonly known as: COMPAZINE Take 1 tablet (10 mg total) by mouth every 6 (six) hours as needed.   venlafaxine XR 75 MG 24 hr capsule Commonly known as: EFFEXOR-XR TAKE 1 CAPSULE BY MOUTH EVERY MORNING AND 2 CAPSULES AT BEDTIME What changed: See the new instructions.   vitamin C 1000 MG tablet Take 1,000 mg by mouth daily.               Durable Medical  Equipment  (From admission, onward)           Start     Ordered   05/21/20 1421  For home use only DME oxygen  Once       Question Answer Comment  Length of Need 6 Months   Mode or (Route) Nasal cannula   Liters per Minute 2   Frequency Continuous (stationary and portable oxygen unit needed)   Oxygen conserving device Yes   Oxygen delivery system Gas      05/21/20 1420            Follow-up Information     Copland, Spencer, MD. Schedule an appointment as soon as possible for a visit in 1 week(s).   Specialties: Family Medicine, Sports Medicine Why: blood work, regular followup, oxygen followup Contact information: [place] [address] [phone]         Sueanne Margarita, MD .   Specialty: Cardiology Contact information: [address] [place] [address] [phone]                  Time coordinating discharge: 39 minutes  Signed:  Cristian Grieves  [facility] 05/21/2020, 2:48 PM

## 2020-05-22 ENCOUNTER — Ambulatory Visit: Payer: Medicare Other

## 2020-05-22 ENCOUNTER — Telehealth: Payer: Self-pay

## 2020-05-22 NOTE — Telephone Encounter (Signed)
Transition Care Management Follow-up Telephone Call  Date of discharge and from where: 05/21/2020, [name]  How have you been since you were released from the hospital? Patient is feeling better  Any questions or concerns? Yes, states he called [facility] for physical therapy and they told him that the hospital had not sent over orders for this. Would like Dr. Lorelei Pont to follow up on this for him.   Items Reviewed:  Did the pt receive and understand the discharge instructions provided? Yes   Medications obtained and verified? Yes   Other? No   Any new allergies since your discharge? No   Dietary orders reviewed? Yes  Do you have support at home? Yes   Home Care and Equipment/Supplies: Were home health services ordered? yes If so, what is the name of the agency? [place]  Has the agency set up a time to come to the patient's home? no Were any new equipment or medical supplies ordered?  No What is the name of the medical supply agency? N/A Were you able to get the supplies/equipment? not applicable Do you have any questions related to the use of the equipment or supplies? No  Functional Questionnaire: (I = Independent and D = Dependent) ADLs: I  Bathing/Dressing- I  Meal Prep- I  Eating- I  Maintaining continence- I  Transferring/Ambulation- I  Managing Meds- I  Follow up appointments reviewed:   PCP Hospital f/u appt confirmed? Yes  Scheduled to see Dr. Lorelei Pont on 05/28/2020 @ 2:40 pm.  [facility] f/u appt confirmed? follow up with cardiology   Are transportation arrangements needed? No   If their condition worsens, is the pt aware to call PCP or go to the Emergency Dept.? Yes  Was the patient provided with contact information for the PCP's office or ED? Yes  Was to pt encouraged to call back with questions or concerns? Yes

## 2020-05-22 NOTE — Telephone Encounter (Signed)
He should have open home health orders and coming to his house - can you call and double-check?

## 2020-05-22 NOTE — Telephone Encounter (Signed)
He is wanting PT.

## 2020-05-23 ENCOUNTER — Ambulatory Visit: Payer: Medicare Other

## 2020-05-23 NOTE — Telephone Encounter (Signed)
I spoke with [name] at [facility] and verified that they do have [facility] PT orders from the hospital and everything was confirmed with patient also. I called patient and let him know that orders are received and nothing further is needed at this time. Patient expressed understanding.

## 2020-05-23 NOTE — Telephone Encounter (Signed)
Would you mind calling them and ask for PT to evaluate - home health PT

## 2020-05-24 ENCOUNTER — Other Ambulatory Visit: Payer: Self-pay | Admitting: *Deleted

## 2020-05-24 DIAGNOSIS — E1142 Type 2 diabetes mellitus with diabetic polyneuropathy: Secondary | ICD-10-CM | POA: Diagnosis not present

## 2020-05-24 DIAGNOSIS — E785 Hyperlipidemia, unspecified: Secondary | ICD-10-CM | POA: Diagnosis not present

## 2020-05-24 DIAGNOSIS — J9601 Acute respiratory failure with hypoxia: Secondary | ICD-10-CM | POA: Diagnosis not present

## 2020-05-24 DIAGNOSIS — E1122 Type 2 diabetes mellitus with diabetic chronic kidney disease: Secondary | ICD-10-CM | POA: Diagnosis not present

## 2020-05-24 DIAGNOSIS — C3491 Malignant neoplasm of unspecified part of right bronchus or lung: Secondary | ICD-10-CM | POA: Diagnosis not present

## 2020-05-24 DIAGNOSIS — Z7984 Long term (current) use of oral hypoglycemic drugs: Secondary | ICD-10-CM | POA: Diagnosis not present

## 2020-05-24 DIAGNOSIS — D61810 Antineoplastic chemotherapy induced pancytopenia: Secondary | ICD-10-CM | POA: Diagnosis not present

## 2020-05-24 DIAGNOSIS — Z794 Long term (current) use of insulin: Secondary | ICD-10-CM | POA: Diagnosis not present

## 2020-05-24 DIAGNOSIS — M109 Gout, unspecified: Secondary | ICD-10-CM | POA: Diagnosis not present

## 2020-05-24 DIAGNOSIS — K219 Gastro-esophageal reflux disease without esophagitis: Secondary | ICD-10-CM | POA: Diagnosis not present

## 2020-05-24 DIAGNOSIS — G3184 Mild cognitive impairment, so stated: Secondary | ICD-10-CM | POA: Diagnosis not present

## 2020-05-24 DIAGNOSIS — E876 Hypokalemia: Secondary | ICD-10-CM | POA: Diagnosis not present

## 2020-05-24 DIAGNOSIS — U071 COVID-19: Secondary | ICD-10-CM | POA: Diagnosis not present

## 2020-05-24 DIAGNOSIS — E43 Unspecified severe protein-calorie malnutrition: Secondary | ICD-10-CM | POA: Diagnosis not present

## 2020-05-24 DIAGNOSIS — N1831 Chronic kidney disease, stage 3a: Secondary | ICD-10-CM | POA: Diagnosis not present

## 2020-05-24 DIAGNOSIS — I4891 Unspecified atrial fibrillation: Secondary | ICD-10-CM | POA: Diagnosis not present

## 2020-05-24 DIAGNOSIS — M1990 Unspecified osteoarthritis, unspecified site: Secondary | ICD-10-CM | POA: Diagnosis not present

## 2020-05-24 DIAGNOSIS — I129 Hypertensive chronic kidney disease with stage 1 through stage 4 chronic kidney disease, or unspecified chronic kidney disease: Secondary | ICD-10-CM | POA: Diagnosis not present

## 2020-05-24 DIAGNOSIS — G4733 Obstructive sleep apnea (adult) (pediatric): Secondary | ICD-10-CM | POA: Diagnosis not present

## 2020-05-24 DIAGNOSIS — Z79899 Other long term (current) drug therapy: Secondary | ICD-10-CM | POA: Diagnosis not present

## 2020-05-24 DIAGNOSIS — J302 Other seasonal allergic rhinitis: Secondary | ICD-10-CM | POA: Diagnosis not present

## 2020-05-24 DIAGNOSIS — R1314 Dysphagia, pharyngoesophageal phase: Secondary | ICD-10-CM | POA: Diagnosis not present

## 2020-05-24 DIAGNOSIS — Z96641 Presence of right artificial hip joint: Secondary | ICD-10-CM | POA: Diagnosis not present

## 2020-05-24 NOTE — Patient Outreach (Signed)
[facility]) Care Management  05/24/2020  [name] [birth year] [id number]   Patient now active with chronic care management, will close to [place].  New RNCM will follow up for post discharge assessment.  [name], [place], MSN [place] [phone]

## 2020-05-25 ENCOUNTER — Encounter (HOSPITAL_COMMUNITY): Payer: Self-pay

## 2020-05-25 ENCOUNTER — Other Ambulatory Visit: Payer: Self-pay

## 2020-05-25 ENCOUNTER — Emergency Department (HOSPITAL_COMMUNITY): Payer: Medicare Other

## 2020-05-25 ENCOUNTER — Inpatient Hospital Stay (HOSPITAL_COMMUNITY)
Admission: EM | Admit: 2020-05-25 | Discharge: 2020-06-10 | DRG: 871 | Disposition: E | Payer: Medicare Other | Attending: Family Medicine | Admitting: Family Medicine

## 2020-05-25 DIAGNOSIS — R7989 Other specified abnormal findings of blood chemistry: Secondary | ICD-10-CM | POA: Diagnosis not present

## 2020-05-25 DIAGNOSIS — Z515 Encounter for palliative care: Secondary | ICD-10-CM

## 2020-05-25 DIAGNOSIS — R6889 Other general symptoms and signs: Secondary | ICD-10-CM | POA: Diagnosis not present

## 2020-05-25 DIAGNOSIS — C3490 Malignant neoplasm of unspecified part of unspecified bronchus or lung: Secondary | ICD-10-CM | POA: Diagnosis present

## 2020-05-25 DIAGNOSIS — J159 Unspecified bacterial pneumonia: Secondary | ICD-10-CM | POA: Diagnosis present

## 2020-05-25 DIAGNOSIS — E1122 Type 2 diabetes mellitus with diabetic chronic kidney disease: Secondary | ICD-10-CM | POA: Diagnosis present

## 2020-05-25 DIAGNOSIS — I10 Essential (primary) hypertension: Secondary | ICD-10-CM | POA: Diagnosis present

## 2020-05-25 DIAGNOSIS — Z8249 Family history of ischemic heart disease and other diseases of the circulatory system: Secondary | ICD-10-CM

## 2020-05-25 DIAGNOSIS — J690 Pneumonitis due to inhalation of food and vomit: Secondary | ICD-10-CM | POA: Diagnosis not present

## 2020-05-25 DIAGNOSIS — I480 Paroxysmal atrial fibrillation: Secondary | ICD-10-CM | POA: Diagnosis not present

## 2020-05-25 DIAGNOSIS — E875 Hyperkalemia: Secondary | ICD-10-CM | POA: Diagnosis not present

## 2020-05-25 DIAGNOSIS — Z823 Family history of stroke: Secondary | ICD-10-CM

## 2020-05-25 DIAGNOSIS — U099 Post covid-19 condition, unspecified: Secondary | ICD-10-CM | POA: Diagnosis present

## 2020-05-25 DIAGNOSIS — J9602 Acute respiratory failure with hypercapnia: Secondary | ICD-10-CM | POA: Diagnosis present

## 2020-05-25 DIAGNOSIS — E1110 Type 2 diabetes mellitus with ketoacidosis without coma: Secondary | ICD-10-CM | POA: Diagnosis not present

## 2020-05-25 DIAGNOSIS — C799 Secondary malignant neoplasm of unspecified site: Secondary | ICD-10-CM | POA: Diagnosis not present

## 2020-05-25 DIAGNOSIS — N179 Acute kidney failure, unspecified: Secondary | ICD-10-CM | POA: Diagnosis present

## 2020-05-25 DIAGNOSIS — Z820 Family history of epilepsy and other diseases of the nervous system: Secondary | ICD-10-CM

## 2020-05-25 DIAGNOSIS — L8990 Pressure ulcer of unspecified site, unspecified stage: Secondary | ICD-10-CM | POA: Insufficient documentation

## 2020-05-25 DIAGNOSIS — D638 Anemia in other chronic diseases classified elsewhere: Secondary | ICD-10-CM | POA: Diagnosis not present

## 2020-05-25 DIAGNOSIS — Z833 Family history of diabetes mellitus: Secondary | ICD-10-CM

## 2020-05-25 DIAGNOSIS — Z9221 Personal history of antineoplastic chemotherapy: Secondary | ICD-10-CM | POA: Diagnosis not present

## 2020-05-25 DIAGNOSIS — C3491 Malignant neoplasm of unspecified part of right bronchus or lung: Secondary | ICD-10-CM | POA: Diagnosis not present

## 2020-05-25 DIAGNOSIS — R0902 Hypoxemia: Secondary | ICD-10-CM | POA: Diagnosis not present

## 2020-05-25 DIAGNOSIS — N1831 Chronic kidney disease, stage 3a: Secondary | ICD-10-CM | POA: Diagnosis present

## 2020-05-25 DIAGNOSIS — L89152 Pressure ulcer of sacral region, stage 2: Secondary | ICD-10-CM | POA: Diagnosis present

## 2020-05-25 DIAGNOSIS — E785 Hyperlipidemia, unspecified: Secondary | ICD-10-CM | POA: Diagnosis present

## 2020-05-25 DIAGNOSIS — Z801 Family history of malignant neoplasm of trachea, bronchus and lung: Secondary | ICD-10-CM

## 2020-05-25 DIAGNOSIS — A419 Sepsis, unspecified organism: Principal | ICD-10-CM | POA: Diagnosis present

## 2020-05-25 DIAGNOSIS — Z794 Long term (current) use of insulin: Secondary | ICD-10-CM

## 2020-05-25 DIAGNOSIS — Z66 Do not resuscitate: Secondary | ICD-10-CM | POA: Diagnosis not present

## 2020-05-25 DIAGNOSIS — D696 Thrombocytopenia, unspecified: Secondary | ICD-10-CM | POA: Diagnosis not present

## 2020-05-25 DIAGNOSIS — J9601 Acute respiratory failure with hypoxia: Secondary | ICD-10-CM | POA: Diagnosis not present

## 2020-05-25 DIAGNOSIS — Z825 Family history of asthma and other chronic lower respiratory diseases: Secondary | ICD-10-CM

## 2020-05-25 DIAGNOSIS — I129 Hypertensive chronic kidney disease with stage 1 through stage 4 chronic kidney disease, or unspecified chronic kidney disease: Secondary | ICD-10-CM | POA: Diagnosis present

## 2020-05-25 DIAGNOSIS — J90 Pleural effusion, not elsewhere classified: Secondary | ICD-10-CM | POA: Diagnosis present

## 2020-05-25 DIAGNOSIS — U071 COVID-19: Secondary | ICD-10-CM | POA: Diagnosis not present

## 2020-05-25 DIAGNOSIS — E1010 Type 1 diabetes mellitus with ketoacidosis without coma: Secondary | ICD-10-CM

## 2020-05-25 DIAGNOSIS — R9431 Abnormal electrocardiogram [ECG] [EKG]: Secondary | ICD-10-CM | POA: Diagnosis not present

## 2020-05-25 DIAGNOSIS — Z743 Need for continuous supervision: Secondary | ICD-10-CM | POA: Diagnosis not present

## 2020-05-25 DIAGNOSIS — F32A Depression, unspecified: Secondary | ICD-10-CM | POA: Diagnosis present

## 2020-05-25 DIAGNOSIS — Z7189 Other specified counseling: Secondary | ICD-10-CM

## 2020-05-25 DIAGNOSIS — Z7901 Long term (current) use of anticoagulants: Secondary | ICD-10-CM

## 2020-05-25 DIAGNOSIS — J9809 Other diseases of bronchus, not elsewhere classified: Secondary | ICD-10-CM | POA: Diagnosis present

## 2020-05-25 DIAGNOSIS — R0602 Shortness of breath: Secondary | ICD-10-CM | POA: Diagnosis not present

## 2020-05-25 LAB — COMPREHENSIVE METABOLIC PANEL
ALT: 20 U/L (ref 0–44)
AST: 10 U/L — ABNORMAL LOW (ref 15–41)
Albumin: 2.2 g/dL — ABNORMAL LOW (ref 3.5–5.0)
Alkaline Phosphatase: 113 U/L (ref 38–126)
Anion gap: 21 — ABNORMAL HIGH (ref 5–15)
BUN: 89 mg/dL — ABNORMAL HIGH (ref 8–23)
CO2: 15 mmol/L — ABNORMAL LOW (ref 22–32)
Calcium: 8.2 mg/dL — ABNORMAL LOW (ref 8.9–10.3)
Chloride: 95 mmol/L — ABNORMAL LOW (ref 98–111)
Creatinine, Ser: 3.52 mg/dL — ABNORMAL HIGH (ref 0.61–1.24)
GFR, Estimated: 17 mL/min — ABNORMAL LOW (ref >60)
Glucose, Bld: 415 mg/dL — ABNORMAL HIGH (ref 70–99)
Potassium: 6.1 mmol/L — ABNORMAL HIGH (ref 3.5–5.1)
Sodium: 131 mmol/L — ABNORMAL LOW (ref 135–145)
Total Bilirubin: 2.2 mg/dL — ABNORMAL HIGH (ref 0.3–1.2)
Total Protein: 5.8 g/dL — ABNORMAL LOW (ref 6.5–8.1)

## 2020-05-25 LAB — CBC WITH DIFFERENTIAL/PLATELET
Abs Immature Granulocytes: 0.68 10*3/uL — ABNORMAL HIGH (ref 0.00–0.07)
Basophils Absolute: 0.1 10*3/uL (ref 0.0–0.1)
Basophils Relative: 0 %
Eosinophils Absolute: 0 10*3/uL (ref 0.0–0.5)
Eosinophils Relative: 0 %
HCT: 27.8 % — ABNORMAL LOW (ref 39.0–52.0)
Hemoglobin: 8.2 g/dL — ABNORMAL LOW (ref 13.0–17.0)
Immature Granulocytes: 3 %
Lymphocytes Relative: 0 %
Lymphs Abs: 0.1 10*3/uL — ABNORMAL LOW (ref 0.7–4.0)
MCH: 31.1 pg (ref 26.0–34.0)
MCHC: 29.5 g/dL — ABNORMAL LOW (ref 30.0–36.0)
MCV: 105.3 fL — ABNORMAL HIGH (ref 80.0–100.0)
Monocytes Absolute: 0.6 10*3/uL (ref 0.1–1.0)
Monocytes Relative: 2 %
Neutro Abs: 25 10*3/uL — ABNORMAL HIGH (ref 1.7–7.7)
Neutrophils Relative %: 95 %
Platelets: 89 10*3/uL — ABNORMAL LOW (ref 150–400)
RBC: 2.64 MIL/uL — ABNORMAL LOW (ref 4.22–5.81)
RDW: 16.1 % — ABNORMAL HIGH (ref 11.5–15.5)
WBC: 26.4 10*3/uL — ABNORMAL HIGH (ref 4.0–10.5)
nRBC: 0 % (ref 0.0–0.2)

## 2020-05-25 LAB — BASIC METABOLIC PANEL
Anion gap: 19 — ABNORMAL HIGH (ref 5–15)
BUN: 79 mg/dL — ABNORMAL HIGH (ref 8–23)
CO2: 19 mmol/L — ABNORMAL LOW (ref 22–32)
Calcium: 8.1 mg/dL — ABNORMAL LOW (ref 8.9–10.3)
Chloride: 98 mmol/L (ref 98–111)
Creatinine, Ser: 3.78 mg/dL — ABNORMAL HIGH (ref 0.61–1.24)
GFR, Estimated: 16 mL/min — ABNORMAL LOW (ref >60)
Glucose, Bld: 404 mg/dL — ABNORMAL HIGH (ref 70–99)
Potassium: 5.7 mmol/L — ABNORMAL HIGH (ref 3.5–5.1)
Sodium: 136 mmol/L (ref 135–145)

## 2020-05-25 LAB — TYPE AND SCREEN
ABO/RH(D): A POS
Antibody Screen: NEGATIVE

## 2020-05-25 LAB — LACTIC ACID, PLASMA: Lactic Acid, Venous: 1.7 mmol/L (ref 0.5–1.9)

## 2020-05-25 LAB — CBG MONITORING, ED
Glucose-Capillary: 359 mg/dL — ABNORMAL HIGH (ref 70–99)
Glucose-Capillary: 360 mg/dL — ABNORMAL HIGH (ref 70–99)
Glucose-Capillary: 361 mg/dL — ABNORMAL HIGH (ref 70–99)

## 2020-05-25 LAB — FIBRINOGEN: Fibrinogen: >800 mg/dL — ABNORMAL HIGH (ref 210–475)

## 2020-05-25 LAB — FERRITIN: Ferritin: 2648 ng/mL — ABNORMAL HIGH (ref 24–336)

## 2020-05-25 LAB — BLOOD GAS, VENOUS
Acid-base deficit: 10.2 mmol/L — ABNORMAL HIGH (ref 0.0–2.0)
Bicarbonate: 16.9 mmol/L — ABNORMAL LOW (ref 20.0–28.0)
O2 Saturation: 84 %
Patient temperature: 98.6
pCO2, Ven: 45.9 mmHg (ref 44.0–60.0)
pH, Ven: 7.191 — CL (ref 7.250–7.430)
pO2, Ven: 62.3 mmHg — ABNORMAL HIGH (ref 32.0–45.0)

## 2020-05-25 LAB — LACTATE DEHYDROGENASE: LDH: 129 U/L (ref 98–192)

## 2020-05-25 LAB — PROTIME-INR
INR: 3.1 — ABNORMAL HIGH (ref 0.8–1.2)
Prothrombin Time: 30.9 seconds — ABNORMAL HIGH (ref 11.4–15.2)

## 2020-05-25 LAB — POC OCCULT BLOOD, ED: Fecal Occult Bld: POSITIVE — AB

## 2020-05-25 LAB — TRIGLYCERIDES: Triglycerides: 76 mg/dL (ref <150)

## 2020-05-25 LAB — PROCALCITONIN: Procalcitonin: 11.8 ng/mL

## 2020-05-25 LAB — BRAIN NATRIURETIC PEPTIDE: B Natriuretic Peptide: 512.7 pg/mL — ABNORMAL HIGH (ref 0.0–100.0)

## 2020-05-25 LAB — APTT: aPTT: 32 seconds (ref 24–36)

## 2020-05-25 LAB — D-DIMER, QUANTITATIVE: D-Dimer, Quant: 11.76 ug/mL-FEU — ABNORMAL HIGH (ref 0.00–0.50)

## 2020-05-25 LAB — C-REACTIVE PROTEIN: CRP: 31.6 mg/dL — ABNORMAL HIGH (ref <1.0)

## 2020-05-25 MED ORDER — SODIUM CHLORIDE 0.9 % IV BOLUS (SEPSIS)
1000.0000 mL | Freq: Once | INTRAVENOUS | Status: AC
  Administered 2020-05-25: 1000 mL via INTRAVENOUS

## 2020-05-25 MED ORDER — SODIUM BICARBONATE 8.4 % IV SOLN
50.0000 meq | Freq: Once | INTRAVENOUS | Status: AC
  Administered 2020-05-25: 50 meq via INTRAVENOUS
  Filled 2020-05-25: qty 50

## 2020-05-25 MED ORDER — HEPARIN (PORCINE) 25000 UT/250ML-% IV SOLN
1450.0000 [IU]/h | INTRAVENOUS | Status: DC
  Administered 2020-05-26: 1300 [IU]/h via INTRAVENOUS
  Filled 2020-05-25: qty 250

## 2020-05-25 MED ORDER — INSULIN REGULAR(HUMAN) IN NACL 100-0.9 UT/100ML-% IV SOLN
INTRAVENOUS | Status: DC
  Administered 2020-05-25: 22:00:00 14 [IU]/h via INTRAVENOUS
  Filled 2020-05-25: qty 100

## 2020-05-25 MED ORDER — DEXTROSE 50 % IV SOLN: 0.0000 mL | INTRAVENOUS | Status: DC | PRN

## 2020-05-25 MED ORDER — SODIUM ZIRCONIUM CYCLOSILICATE 10 G PO PACK
10.0000 g | PACK | Freq: Once | ORAL | Status: DC
  Filled 2020-05-25: qty 1

## 2020-05-25 MED ORDER — LACTATED RINGERS IV SOLN: INTRAVENOUS | Status: DC

## 2020-05-25 MED ORDER — SODIUM CHLORIDE 0.9 % IV SOLN: 2.0000 g | INTRAVENOUS | Status: DC

## 2020-05-25 MED ORDER — VANCOMYCIN HCL IN DEXTROSE 1-5 GM/200ML-% IV SOLN
1000.0000 mg | Freq: Once | INTRAVENOUS | Status: AC
  Administered 2020-05-25: 1000 mg via INTRAVENOUS
  Filled 2020-05-25: qty 200

## 2020-05-25 MED ORDER — DEXTROSE IN LACTATED RINGERS 5 % IV SOLN: INTRAVENOUS | Status: DC

## 2020-05-25 MED ORDER — PANTOPRAZOLE SODIUM 40 MG IV SOLR
40.0000 mg | Freq: Once | INTRAVENOUS | Status: AC
  Administered 2020-05-25: 40 mg via INTRAVENOUS
  Filled 2020-05-25: qty 40

## 2020-05-25 MED ORDER — SODIUM CHLORIDE 0.9 % IV SOLN
2.0000 g | Freq: Once | INTRAVENOUS | Status: AC
  Administered 2020-05-25: 2 g via INTRAVENOUS
  Filled 2020-05-25: qty 2

## 2020-05-25 MED ORDER — METRONIDAZOLE IN NACL 5-0.79 MG/ML-% IV SOLN
500.0000 mg | Freq: Three times a day (TID) | INTRAVENOUS | Status: DC
  Administered 2020-05-26 (×2): 500 mg via INTRAVENOUS
  Filled 2020-05-25 (×2): qty 100

## 2020-05-25 NOTE — ED Notes (Signed)
RT [name] called for [facility]

## 2020-05-25 NOTE — ED Provider Notes (Signed)
[facility] DEPT Provider Note   CSN: [id number] Arrival date & time: 05/28/2020  1919     History Chief Complaint  Patient presents with  . Shortness of Breath    [name] is a 75 y.o. male.  HPI    75 year old male comes in a chief complaint of shortness of breath.  He has history of CKD, hypertension, hyperlipidemia, lung cancer.  Patient was just discharged from the hospital a week ago after suffering from COVID-19.  He reports that after being discharged, he has gotten more tired and weak.  He has increased oxygen requirement.  He initially had no oxygen requirement, but was discharged with 2 L.  Now often he is satting in the low 90s despite not moving.  Positive cough.   Patient had a BM in the ER.  It appeared slightly blood-tinged, patient reports that the color of his stool has been a little redder than usual.  He denies any frank bloody stools or melena.  He is not on any blood thinners.   Past Medical History:  Diagnosis Date  . Allergic rhinitis due to pollen   . Chronic kidney disease, stage III (moderate) ([place]) 12/19/2012  . Depression   . Diabetes mellitus ([place])   . Diverticulosis   . Erectile dysfunction associated with type 2 diabetes mellitus ([place]) 05/28/2015  . Former very heavy cigarette smoker (more than 40 per day) 10/07/2014  . GERD (gastroesophageal reflux disease)   . Gout   . Hyperlipidemia   . Hypertension   . Hypogonadism male 06/14/2012  . Memory loss   . Osteoarthritis   . Personal history of colonic polyps    1996  . Small cell carcinoma of right lung ([place]) 10/23/2019    Patient Active Problem List   Diagnosis Date Noted  . Acute respiratory failure with hypoxia ([place]) 06/08/2020  . COVID-19 virus infection 05/14/2020  . SOB (shortness of breath) 05/14/2020  . Generalized weakness 05/14/2020  . Pleural effusion, right 05/14/2020  . DM2 (diabetes mellitus, type 2) ([place]) 05/14/2020  . Hyperlipidemia  05/14/2020  . Prolonged QT interval 03/25/2020  . Atrial fibrillation with RVR ([place]) 03/24/2020  . Secondary hypercoagulable state ([place]) 03/19/2020  . Typical atrial flutter ([place]) 03/11/2020  . Antineoplastic chemotherapy induced pancytopenia ([place]) 03/11/2020  . Severe protein-calorie malnutrition ([place]) 02/12/2020  . Chemotherapy induced neutropenia ([place])   . Severe sepsis ([place]) 11/22/2019  . Neutropenic fever ([place]) 11/22/2019  . Non-small cell carcinoma of right lung, stage 3 ([place]) 10/23/2019  . Goals of care, counseling/discussion 10/23/2019  . Encounter for antineoplastic chemotherapy 10/23/2019  . Small cell lung cancer ([place]) 10/12/2019  . Dropfoot 03/15/2019  . Mild cognitive impairment 05/08/2018  . Esophageal dysphagia   . Diabetic foot ulcer ([place]) 06/10/2017  . Uncontrolled type 2 diabetes mellitus with peripheral neuropathy ([place]) 05/11/2016  . Erectile dysfunction associated with type 2 diabetes mellitus ([place]) 05/28/2015  . Former very heavy cigarette smoker (more than 40 per day) 10/07/2014  . Chronic kidney disease, stage III (moderate) ([place]) 12/19/2012  . Hypogonadism male 06/14/2012  . OSA (obstructive sleep apnea) 03/09/2011  . Diabetic neuropathy ([place]) 12/29/2010  . Family history of MI (myocardial infarction) 09/16/2010  . HIP REPLACEMENT, RIGHT, HX OF 02/05/2010  . Hypertriglyceridemia 06/13/2008  . GOUT 06/13/2008  . Major depressive disorder, recurrent episode, in partial remission ([place]) 06/13/2008  . Essential hypertension 06/13/2008  . GERD 06/13/2008  . OSTEOARTHRITIS 06/13/2008    Past Surgical History:  Procedure  Laterality Date  . BRONCHIAL NEEDLE ASPIRATION BIOPSY  10/17/2019   Procedure: BRONCHIAL NEEDLE ASPIRATION BIOPSIES;  Surgeon: Juanito Doom, MD;  Location: [facility];  Service: Cardiopulmonary;;  . CARDIAC CATHETERIZATION  11/2011   [facility]  . CHOLECYSTECTOMY  1980  . ENDOBRONCHIAL ULTRASOUND Bilateral 10/17/2019   Procedure: ENDOBRONCHIAL  ULTRASOUND;  Surgeon: Juanito Doom, MD;  Location: [facility];  Service: Cardiopulmonary;  Laterality: Bilateral;  . ESOPHAGOGASTRODUODENOSCOPY (EGD) WITH PROPOFOL N/A 09/27/2017   Procedure: ESOPHAGOGASTRODUODENOSCOPY (EGD) WITH PROPOFOL;  Surgeon: Lin Landsman, MD;  Location: [facility];  Service: Gastroenterology;  Laterality: N/A;  . EYE SURGERY Right 07/29/2016   cataract - Dr. Manuella Ghazi  . EYE SURGERY Left 08/23/2106   cataract - Dr. Manuella Ghazi  . HEMOSTASIS CONTROL  10/17/2019   Procedure: HEMOSTASIS CONTROL;  Surgeon: Juanito Doom, MD;  Location: [facility];  Service: Cardiopulmonary;;  . IR IMAGING GUIDED PORT INSERTION  11/30/2019  . IRRIGATION AND DEBRIDEMENT ABSCESS Left 11/23/2019   Procedure: IRRIGATION AND DEBRIDEMENT ABSCESS;  Surgeon: Melina Schools, MD;  Location: [facility];  Service: Orthopedics;  Laterality: Left;  . TOTAL HIP ARTHROPLASTY  2009   Dr. Gladstone Lighter  . TOTAL HIP ARTHROPLASTY         Family History  Problem Relation Age of Onset  . Diabetes Mother   . Alzheimer's disease Mother   . Diabetes Father   . Lung cancer Father        Age 33  . Stroke Father   . Heart attack Father   . Lung cancer Sister        lung  . Heart disease Maternal Grandfather   . COPD Sister   . Heart attack Sister   . Heart disease Sister     Social History   Tobacco Use  . Smoking status: Former Smoker    Packs/day: 3.00    Years: 35.00    Pack years: 105.00    Types: Cigarettes    Quit date: 04/12/2000    Years since quitting: 20.1  . Smokeless tobacco: Never Used  Vaping Use  . Vaping Use: Never used  Substance Use Topics  . Alcohol use: No  . Drug use: No    Home Medications Prior to Admission medications   Medication Sig Start Date End Date Taking? Authorizing Provider  acetaminophen (TYLENOL) 650 MG CR tablet Take 650 mg by mouth every 8 (eight) hours as needed for pain.    [provider]  albuterol (VENTOLIN HFA) 108 (90 Base) MCG/ACT  inhaler Inhale 1-2 puffs into the lungs every 6 (six) hours as needed for wheezing or shortness of breath. 02/15/20   Tanner, Lyndon Code., [name]  apixaban (ELIQUIS) 5 MG TABS tablet Take 1 tablet (5 mg total) by mouth 2 (two) times daily. 05/21/20   Pokhrel, Corrie Mckusick, MD  Ascorbic Acid (VITAMIN C) 1000 MG tablet Take 1,000 mg by mouth daily.    [provider]  atorvastatin (LIPITOR) 20 MG tablet Take 1 tablet (20 mg total) by mouth at bedtime. 05/21/20   Pokhrel, Corrie Mckusick, MD  cholecalciferol (VITAMIN D) 1000 UNITS tablet Take 2,000 Units by mouth daily.    [provider]  diltiazem (CARDIZEM CD) 300 MG 24 hr capsule Take 1 capsule (300 mg total) by mouth daily. 05/21/20 06/20/20  Pokhrel, Corrie Mckusick, MD  donepezil (ARICEPT) 10 MG tablet TAKE 1 TABLET BY MOUTH AT BEDTIME 08/24/19   Copland, Frederico Hamman, MD  fluticasone (FLONASE) 50 MCG/ACT nasal spray Place 1 spray into both  nostrils daily as needed for allergies.    [provider]  guaiFENesin (MUCINEX) 600 MG 12 hr tablet Take 600 mg by mouth daily as needed for cough.    [provider]  Hyprom-Naphaz-Polysorb-Zn Sulf (CLEAR EYES COMPLETE OP) Place 1 drop into both eyes daily as needed (dry eyes).    [provider]  Insulin Pen Needle (PEN NEEDLES) 31G X 5 MM MISC 1 each by Does not apply route 3 (three) times daily. To inject insulin E11.42 09/20/19   Philemon Kingdom, MD  insulin regular human CONCENTRATED (HUMULIN R U-500 KWIKPEN) 500 UNIT/ML kwikpen Inject 20 Units into the skin with breakfast, with lunch, and with evening meal.    [provider]  linagliptin (TRADJENTA) 5 MG TABS tablet Take 1 tablet (5 mg total) by mouth daily. 05/22/20   Pokhrel, Corrie Mckusick, MD  memantine (NAMENDA) 10 MG tablet TAKE 1 TABLET BY MOUTH TWICE A DAY 09/20/19   Copland, Spencer, MD  metoprolol tartrate 37.5 MG TABS Take 37.5 mg by mouth 2 (two) times daily. 05/21/20   Pokhrel, Corrie Mckusick, MD  montelukast (SINGULAIR) 10 MG tablet Take 1 tablet  (10 mg total) by mouth at bedtime. 09/20/19   Bedsole, Amy E, MD  pantoprazole (PROTONIX) 20 MG tablet Take 1 tablet (20 mg total) by mouth daily. 01/21/20   Heilingoetter, Cassandra L, [name]  predniSONE (DELTASONE) 10 MG tablet Take 4 tablets (40 mg total) by mouth daily with breakfast for 4 days. 05/21/20 05/13/2020  Pokhrel, Corrie Mckusick, MD  prochlorperazine (COMPAZINE) 10 MG tablet Take 1 tablet (10 mg total) by mouth every 6 (six) hours as needed. Patient not taking: Reported on 05/14/2020 10/23/19   Heilingoetter, Cassandra L, [name]  venlafaxine XR (EFFEXOR-XR) 75 MG 24 hr capsule TAKE 1 CAPSULE BY MOUTH EVERY MORNING AND 2 CAPSULES AT BEDTIME Patient taking differently: Take 75-150 mg by mouth in the morning and at bedtime. TAKE 1 CAPSULE BY MOUTH EVERY MORNING AND 2 CAPSULES AT BEDTIME 11/26/19   Copland, Frederico Hamman, MD    Allergies    Tetracycline  Review of Systems   Review of Systems  Constitutional: Positive for activity change.  Respiratory: Positive for cough and shortness of breath.   Cardiovascular: Positive for chest pain.  Gastrointestinal: Negative for nausea and vomiting.  Allergic/Immunologic: Negative for immunocompromised state.  Hematological: Does not bruise/bleed easily.  All other systems reviewed and are negative.   Physical Exam Updated Vital Signs BP 111/61   Pulse 60   Temp (!) 96.8 F (36 C) (Rectal)   Resp 20   Ht 5\' 11"  (1.803 m)   Wt 86.2 kg   SpO2 (!) 87%   BMI 26.50 kg/m   Physical Exam Vitals and nursing note reviewed.  Constitutional:      Appearance: He is well-developed.  HENT:     Head: Normocephalic and atraumatic.  Eyes:     Extraocular Movements: EOM normal.     Conjunctiva/sclera: Conjunctivae normal.     Pupils: Pupils are equal, round, and reactive to light.  Cardiovascular:     Rate and Rhythm: Normal rate and regular rhythm.     Heart sounds: Normal heart sounds.  Pulmonary:     Effort: Pulmonary effort is normal. No respiratory  distress.     Breath sounds: Examination of the right-upper field reveals decreased breath sounds. Examination of the right-middle field reveals decreased breath sounds. Examination of the right-lower field reveals decreased breath sounds. Decreased breath sounds present.  Abdominal:  General: Bowel sounds are normal. There is no distension.     Palpations: Abdomen is soft.     Tenderness: There is no abdominal tenderness. There is no guarding or rebound.  Musculoskeletal:     Cervical back: Normal range of motion and neck supple.     Right lower leg: Edema present.     Left lower leg: Edema present.     Comments: Right lower extremity appears to be slightly more edematous than left  Skin:    General: Skin is warm.  Neurological:     Mental Status: He is alert and oriented to person, place, and time.     ED Results / Procedures / Treatments   Labs (all labs ordered are listed, but only abnormal results are displayed) Labs Reviewed  CBC WITH DIFFERENTIAL/PLATELET - Abnormal; Notable for the following components:      Result Value   WBC 26.4 (*)    RBC 2.64 (*)    Hemoglobin 8.2 (*)    HCT 27.8 (*)    MCV 105.3 (*)    MCHC 29.5 (*)    RDW 16.1 (*)    Platelets 89 (*)    Neutro Abs 25.0 (*)    Lymphs Abs 0.1 (*)    Abs Immature Granulocytes 0.68 (*)    All other components within normal limits  COMPREHENSIVE METABOLIC PANEL - Abnormal; Notable for the following components:   Sodium 131 (*)    Potassium 6.1 (*)    Chloride 95 (*)    CO2 15 (*)    Glucose, Bld 415 (*)    BUN 89 (*)    Creatinine, Ser 3.52 (*)    Calcium 8.2 (*)    Total Protein 5.8 (*)    Albumin 2.2 (*)    AST 10 (*)    Total Bilirubin 2.2 (*)    GFR, Estimated 17 (*)    Anion gap 21 (*)    All other components within normal limits  D-DIMER, QUANTITATIVE (NOT AT [facility]) - Abnormal; Notable for the following components:   D-Dimer, Quant 11.76 (*)    All other components within normal limits   FERRITIN - Abnormal; Notable for the following components:   Ferritin 2,648 (*)    All other components within normal limits  FIBRINOGEN - Abnormal; Notable for the following components:   Fibrinogen >800 (*)    All other components within normal limits  C-REACTIVE PROTEIN - Abnormal; Notable for the following components:   CRP 31.6 (*)    All other components within normal limits  BRAIN NATRIURETIC PEPTIDE - Abnormal; Notable for the following components:   B Natriuretic Peptide 512.7 (*)    All other components within normal limits  PROTIME-INR - Abnormal; Notable for the following components:   Prothrombin Time 30.9 (*)    INR 3.1 (*)    All other components within normal limits  BLOOD GAS, VENOUS - Abnormal; Notable for the following components:   pH, Ven 7.191 (*)    pO2, Ven 62.3 (*)    Bicarbonate 16.9 (*)    Acid-base deficit 10.2 (*)    All other components within normal limits  POC OCCULT BLOOD, ED - Abnormal; Notable for the following components:   Fecal Occult Bld POSITIVE (*)    All other components within normal limits  CBG MONITORING, ED - Abnormal; Notable for the following components:   Glucose-Capillary 360 (*)    All other components within normal limits  CULTURE, BLOOD (ROUTINE X  2)  CULTURE, BLOOD (ROUTINE X 2)  URINE CULTURE  LACTIC ACID, PLASMA  PROCALCITONIN  LACTATE DEHYDROGENASE  TRIGLYCERIDES  APTT  URINALYSIS, ROUTINE W REFLEX MICROSCOPIC  BASIC METABOLIC PANEL  BASIC METABOLIC PANEL  BASIC METABOLIC PANEL  SEROTONIN RELEASE ASSAY (SRA)  TYPE AND SCREEN    EKG EKG Interpretation  Date/Time:  Sunday May 25 2020 19:41:26 EST Ventricular Rate:  62 PR Interval:    QRS Duration: 95 QT Interval:  437 QTC Calculation: 444 R Axis:   66 Text Interpretation: Ectopic atrial rhythm Short PR interval Low voltage, extremity and precordial leads Probable anteroseptal infarct, old Minimal ST depression, inferior leads No acute changes Confirmed  by Varney Biles [phone]) on 05/14/2020 8:03:26 PM   Radiology CT Chest Wo Contrast  Result Date: 06/08/2020 CLINICAL DATA:  Shortness of breath and fatigue. COVID positive 2 weeks ago. Right-sided small cell lung cancer. EXAM: CT CHEST WITHOUT CONTRAST TECHNIQUE: Multidetector CT imaging of the chest was performed following the standard protocol without IV contrast. COMPARISON:  Radiograph earlier today. Chest CTA 11 days ago 05/14/2020 FINDINGS: Cardiovascular: Aortic atherosclerosis. Upper normal heart size with coronary artery calcifications. Tip of the right chest port in the lower SVC. No significant pericardial effusion. Mediastinum/Nodes: Right lower paratracheal node measures 18 mm, series 2, image 58, previously 19 mm. Additional small mediastinal lymph nodes that are not enlarged by size criteria. And upper paratracheal node measures 7 mm, series 2, image 32, unchanged. Limited assessment of hilar structures due to lack of IV contrast. No esophageal wall thickening. Lungs/Pleura: Large partially loculated right pleural effusion has increased in size from prior exam. Perihilar consolidation with central air bronchograms again seen, comparison to prior is limited given differences in technique. Cavitary lesion in the right upper lobe with air-fluid level, diminished air component from prior. Borders are less well-defined on the current exam not well assessed. There is mucus or debris within the right mainstem bronchus which completely fills bronchus intermedius. Subsequent near complete right lower and middle lobe collapse. Only small portion of aerated lung is visualized in the upper lobe and anterior right lower lobe. Moderate left pleural effusion, similar to prior. Adjacent airspace disease appears more consolidative and groundglass than previous compressive atelectasis. Subpleural densities in the anterior left upper lobe of diminished from prior, trace residual pleural thickening, series 3,  image 68. Previous left upper lobe pulmonary nodules are not definitively seen. There is a new lingular paramediastinal nodule measuring 9 mm, series 3, image 80. Upper Abdomen: No definite acute findings.  Cholecystectomy. Musculoskeletal: Increase in generalized body wall edema from prior exam. Diffuse thoracic spondylosis. There is a right anterolateral fourth rib fracture with question of underlying lucency. IMPRESSION: 1. Large partially loculated right pleural effusion has increased in size from prior exam. 2. Complete filling of right bronchus intermedius and near complete right lower and middle lobe collapse. 3. Right perihilar consolidation has worsened. Cavitary lesion in the right upper lobe with air-fluid level, diminished air component from prior. Borders are less well-defined on the current exam making size comparison difficult. 4. Left lower lobe airspace disease appears more consolidative and ground-glass on the previous compressive atelectasis, and is suspicious for pneumonia. Moderate left pleural effusion is similar. 5. New 9 mm lingular paramediastinal nodule, indeterminate. The previous peripheral left lung nodules have resolved. 6. Right anterolateral fourth rib fracture with question of underlying lucency, this may be due to subacute fracture or underlying lesion. 7. Increased body wall edema from prior exam. Aortic Atherosclerosis (  ICD10-I70.0). Electronically Signed   By: Keith Rake M.D.   On: 05/24/2020 22:55   DG Chest Port 1 View  Result Date: 05/28/2020 CLINICAL DATA:  Short of breath.  Lung cancer. EXAM: PORTABLE CHEST 1 VIEW COMPARISON:  05/15/2020 and older exams. FINDINGS: Near complete opacification of the right hemithorax with finding loss. Portions of the right lung are aerated, which were not aerated on the prior exam. Left lung is hyperexpanded. There is hazy opacity at the left lung base which is improved from the prior study, consistent with a combination of pleural  fluid and atelectasis as noted on the CT dated 05/14/2020. Remainder of the left lung is clear. Cardiac silhouette normal in size. No left hilar masses. Normal contour of the left side of the mediastinum. Right hilum and right mediastinum obscured by contiguous lung opacity. No pneumothorax. Right internal jugular power Port-A-Cath is stable. IMPRESSION: 1. No acute findings. 2. Mild improvement when compared to the prior chest radiograph. There is more right lung aerated, although most of the right hemithorax remains opacified. Opacity at the left lung base has improved consistent with a decrease pleural effusions/atelectasis. Electronically Signed   By: Lajean Manes M.D.   On: 05/19/2020 20:01    Procedures .Critical Care Performed by: Varney Biles, MD Authorized by: Varney Biles, MD   Critical care provider statement:    Critical care time (minutes):  112   Critical care was necessary to treat or prevent imminent or life-threatening deterioration of the following conditions:  Respiratory failure, circulatory failure, dehydration and renal failure   Critical care was time spent personally by me on the following activities:  Discussions with consultants, evaluation of patient's response to treatment, examination of patient, ordering and performing treatments and interventions, ordering and review of laboratory studies, ordering and review of radiographic studies, pulse oximetry, re-evaluation of patient's condition, obtaining history from patient or surrogate and review of old charts     Medications Ordered in ED Medications  lactated ringers infusion ( Intravenous New Bag/Given 05/22/2020 2203)  dextrose 5 % in lactated ringers infusion (0 mLs Intravenous Hold 05/29/2020 2135)  dextrose 50 % solution 0-50 mL (has no administration in time range)  insulin regular, human (MYXREDLIN) 100 units/ 100 mL infusion (14 Units/hr Intravenous New Bag/Given 06/07/2020 2154)  ceFEPIme (MAXIPIME) 2 g in  sodium chloride 0.9 % 100 mL IVPB (has no administration in time range)  sodium chloride 0.9 % bolus 1,000 mL (1,000 mLs Intravenous New Bag/Given 05/26/2020 2147)  ceFEPIme (MAXIPIME) 2 g in sodium chloride 0.9 % 100 mL IVPB (0 g Intravenous Stopped 05/17/2020 2217)  vancomycin (VANCOCIN) IVPB 1000 mg/200 mL premix (1,000 mg Intravenous New Bag/Given 06/09/2020 2159)  pantoprazole (PROTONIX) injection 40 mg (40 mg Intravenous Given 06/08/2020 2159)    ED Course  I have reviewed the triage vital signs and the nursing notes.  Pertinent labs & imaging results that were available during my care of the patient were reviewed by me and considered in my medical decision making (see chart for details).    MDM Rules/Calculators/A&P                          75 year old male with history of lung cancer, CKD, diabetes comes in a chief complaint of worsening shortness of breath.  Prior to the admission, he was not on any oxygen.  At the time of discharge from the hospital he was on 2 L of oxygen.  Now he  is noted to be requiring 4 L.  He has no aeration on the right side.  X-rays reviewed and patient has complete opacification.  It appears that the prior x-ray also showed significant opacification.  Questionable pneumonia, malignant effusion.   Patient's white count came back significantly elevated. Code sepsis initiated.  Labs revealed AKI with uremia.  There is also DKA.  Diabetes order set initiated. IV fluids ordered.  CT scan of the chest ordered without contrast to see if patient needs bronchoscopy or thoracentesis.  I have high suspicion for PE.  D-dimer is significantly elevated.  There is signs of DVT.  Ultrasound duplex of the leg ordered.  I wanted to start patient on heparin, but his platelet count has trended down slowly.  Additionally he had guaiac positive stools.  I discussed the case with Dr. [name], oncology.  He thinks we can start heparin on the patient if we are concerned for DVT/PE.  HIT  panel can be initiated if the platelet count continues to drop, and in that situation we can discontinue heparin.  Consulted medicine for admission. Adding palliative medicine consult as well.  As I was leaving, the nursing staff informed me that patient returned to the CT scan is requiring 8 L of oxygen.  I requested that she call RT for assessment for BiPAP.  Hospitalist team has been made aware of this change.  They will assess the patient to see if he needs ICU.  Final Clinical Impression(s) / ED Diagnoses Final diagnoses:  Acute respiratory failure with hypoxia ([place])  Diabetic ketoacidosis without coma associated with type 1 diabetes mellitus ([place])  AKI (acute kidney injury) ([place])    Rx / DC Orders ED Discharge Orders    None       Varney Biles, MD 05/23/2020 2310

## 2020-05-25 NOTE — ED Notes (Signed)
Increased patient from 2L [place] to 4L  due to oxygen saturation of 90%. Patient improved to 95%.

## 2020-05-25 NOTE — ED Notes (Signed)
Pt assisted onto bedpan.

## 2020-05-25 NOTE — Progress Notes (Signed)
Code Sepsis initiated @ 2113, [place] following.

## 2020-05-25 NOTE — ED Notes (Signed)
CRITICAL VALUE STICKER  CRITICAL VALUE: VBG pH 7.19  RECEIVER (on-site recipient of call): Jake  DATE & TIME NOTIFIED: 06/03/2020 1020p  MESSENGER (representative from lab): Cloyde Reams  MD NOTIFIED: Kathrynn Humble MD  TIME OF NOTIFICATION: 1020p  RESPONSE: see orders

## 2020-05-25 NOTE — Progress Notes (Signed)
ANTICOAGULATION CONSULT NOTE - Initial Consult  Pharmacy Consult for heparin Indication: pulmonary embolus  Allergies  Allergen Reactions  . Tetracycline Itching and Rash    Patient Measurements: Height: 5\' 11"  (180.3 cm) Weight: 86.2 kg (190 lb) IBW/kg (Calculated) : 75.3 Heparin Dosing Weight: 86.2kg  Vital Signs: Temp: 96.8 F (36 C) (02/13 1931) Temp Source: Rectal (02/13 1931) BP: 111/61 (02/13 2300) Pulse Rate: 63 (02/13 2306)  Labs: Recent Labs    05/29/2020 1939 06/05/2020 2145  HGB 8.2*  --   HCT 27.8*  --   PLT 89*  --   APTT  --  32  LABPROT  --  30.9*  INR  --  3.1*  CREATININE 3.52*  --     Estimated Creatinine Clearance: 19.6 mL/min (A) (by C-G formula based on SCr of 3.52 mg/dL (H)).   Medical History: Past Medical History:  Diagnosis Date  . Allergic rhinitis due to pollen   . Chronic kidney disease, stage III (moderate) ([place]) 12/19/2012  . Depression   . Diabetes mellitus ([place])   . Diverticulosis   . Erectile dysfunction associated with type 2 diabetes mellitus ([place]) 05/28/2015  . Former very heavy cigarette smoker (more than 40 per day) 10/07/2014  . GERD (gastroesophageal reflux disease)   . Gout   . Hyperlipidemia   . Hypertension   . Hypogonadism male 06/14/2012  . Memory loss   . Osteoarthritis   . Personal history of colonic polyps    1996  . Small cell carcinoma of right lung ([place]) 10/23/2019      Assessment: 75 year old male comes in a chief complaint of shortness of breath.  He has history of CKD, hypertension, hyperlipidemia, lung cancer.  Pt d/c'd from hospital a week ago after suffering from [place]. Pharmacy consulted to dose heparin for PE. Pt takes apixaban for Afib, LD 2/13 am  MD discussed case with Dr. [name], oncology, recommends to start heparin despite low plts  05/23/2020  Hgb 8.2 Plts 89 INR 3.1 Scr 3.52 aPTT 32, ordered baseline HL  Goal of Therapy:  Heparin level 0.3-0.7 units/ml APTT 66-102 seconds Monitor  platelets by anticoagulation protocol   Plan:  No bolus, start heparin drip at 1300 units/hr APTT and Heparin level in 8 hours Daily CBC Follow for S/S of bleeding  Dolly Rias RPh 06/07/2020, 11:15 PM

## 2020-05-25 NOTE — ED Triage Notes (Signed)
Pt presents from home via [facility], c/o SOB and fatigue worsening today, tested positive for covid > 14 days ago. Wears 2L [place] at baseline and per EMS was 84% on 2L, hx lung CA. Pt 94% on 2L [place] here. Pt denies fevers or pain

## 2020-05-25 NOTE — ED Notes (Signed)
Patient transported to CT 

## 2020-05-25 NOTE — Progress Notes (Signed)
PHARMACY NOTE -  Cefepime  Pharmacy has been assisting with dosing of cefepime for CAP.  Dosage remains stable at 2g IV q24 and further renal adjustments per institutional Pharmacy antibiotic protocol   Pharmacy will sign off, following peripherally for culture results or dose adjustments. Please reconsult if a change in clinical status warrants re-evaluation of dosage.  Reuel Boom, PharmD, BCPS [phone] 05/24/2020, 9:32 PM

## 2020-05-26 ENCOUNTER — Inpatient Hospital Stay (HOSPITAL_COMMUNITY): Payer: Medicare Other

## 2020-05-26 ENCOUNTER — Telehealth: Payer: Self-pay

## 2020-05-26 ENCOUNTER — Ambulatory Visit: Payer: Medicare Other

## 2020-05-26 DIAGNOSIS — N179 Acute kidney failure, unspecified: Secondary | ICD-10-CM

## 2020-05-26 DIAGNOSIS — D696 Thrombocytopenia, unspecified: Secondary | ICD-10-CM

## 2020-05-26 DIAGNOSIS — R7989 Other specified abnormal findings of blood chemistry: Secondary | ICD-10-CM | POA: Diagnosis present

## 2020-05-26 DIAGNOSIS — C3490 Malignant neoplasm of unspecified part of unspecified bronchus or lung: Secondary | ICD-10-CM

## 2020-05-26 DIAGNOSIS — J9809 Other diseases of bronchus, not elsewhere classified: Secondary | ICD-10-CM

## 2020-05-26 DIAGNOSIS — E1110 Type 2 diabetes mellitus with ketoacidosis without coma: Secondary | ICD-10-CM | POA: Diagnosis not present

## 2020-05-26 DIAGNOSIS — L8990 Pressure ulcer of unspecified site, unspecified stage: Secondary | ICD-10-CM | POA: Insufficient documentation

## 2020-05-26 DIAGNOSIS — I480 Paroxysmal atrial fibrillation: Secondary | ICD-10-CM | POA: Diagnosis not present

## 2020-05-26 DIAGNOSIS — N1831 Chronic kidney disease, stage 3a: Secondary | ICD-10-CM

## 2020-05-26 DIAGNOSIS — J9601 Acute respiratory failure with hypoxia: Secondary | ICD-10-CM | POA: Diagnosis not present

## 2020-05-26 DIAGNOSIS — J90 Pleural effusion, not elsewhere classified: Secondary | ICD-10-CM

## 2020-05-26 DIAGNOSIS — I10 Essential (primary) hypertension: Secondary | ICD-10-CM

## 2020-05-26 DIAGNOSIS — E875 Hyperkalemia: Secondary | ICD-10-CM

## 2020-05-26 LAB — BLOOD GAS, ARTERIAL
Acid-base deficit: 8.5 mmol/L — ABNORMAL HIGH (ref 0.0–2.0)
Bicarbonate: 19.5 mmol/L — ABNORMAL LOW (ref 20.0–28.0)
Drawn by: 225631
FIO2: 100
O2 Content: 15 L/min
O2 Saturation: 93.5 %
Patient temperature: 98.7
pCO2 arterial: 57.1 mmHg — ABNORMAL HIGH (ref 32.0–48.0)
pH, Arterial: 7.16 — CL (ref 7.350–7.450)
pO2, Arterial: 86.9 mmHg (ref 83.0–108.0)

## 2020-05-26 LAB — URINALYSIS, ROUTINE W REFLEX MICROSCOPIC
Bilirubin Urine: NEGATIVE
Glucose, UA: 50 mg/dL — AB
Hgb urine dipstick: NEGATIVE
Ketones, ur: 5 mg/dL — AB
Leukocytes,Ua: NEGATIVE
Nitrite: NEGATIVE
Protein, ur: 30 mg/dL — AB
Specific Gravity, Urine: 1.013 (ref 1.005–1.030)
pH: 5 (ref 5.0–8.0)

## 2020-05-26 LAB — GLUCOSE, CAPILLARY
Glucose-Capillary: 138 mg/dL — ABNORMAL HIGH (ref 70–99)
Glucose-Capillary: 137 mg/dL — ABNORMAL HIGH (ref 70–99)
Glucose-Capillary: 137 mg/dL — ABNORMAL HIGH (ref 70–99)
Glucose-Capillary: 100 mg/dL — ABNORMAL HIGH (ref 70–99)
Glucose-Capillary: 95 mg/dL (ref 70–99)
Glucose-Capillary: 100 mg/dL — ABNORMAL HIGH (ref 70–99)
Glucose-Capillary: 101 mg/dL — ABNORMAL HIGH (ref 70–99)
Glucose-Capillary: 125 mg/dL — ABNORMAL HIGH (ref 70–99)
Glucose-Capillary: 127 mg/dL — ABNORMAL HIGH (ref 70–99)

## 2020-05-26 LAB — BASIC METABOLIC PANEL
Anion gap: 18 — ABNORMAL HIGH (ref 5–15)
Anion gap: 14 (ref 5–15)
Anion gap: 12 (ref 5–15)
BUN: 87 mg/dL — ABNORMAL HIGH (ref 8–23)
BUN: 86 mg/dL — ABNORMAL HIGH (ref 8–23)
BUN: 90 mg/dL — ABNORMAL HIGH (ref 8–23)
CO2: 19 mmol/L — ABNORMAL LOW (ref 22–32)
CO2: 23 mmol/L (ref 22–32)
CO2: 25 mmol/L (ref 22–32)
Calcium: 7.9 mg/dL — ABNORMAL LOW (ref 8.9–10.3)
Calcium: 7.9 mg/dL — ABNORMAL LOW (ref 8.9–10.3)
Calcium: 7.8 mg/dL — ABNORMAL LOW (ref 8.9–10.3)
Chloride: 97 mmol/L — ABNORMAL LOW (ref 98–111)
Chloride: 97 mmol/L — ABNORMAL LOW (ref 98–111)
Chloride: 98 mmol/L (ref 98–111)
Creatinine, Ser: 3.51 mg/dL — ABNORMAL HIGH (ref 0.61–1.24)
Creatinine, Ser: 3.53 mg/dL — ABNORMAL HIGH (ref 0.61–1.24)
Creatinine, Ser: 3.42 mg/dL — ABNORMAL HIGH (ref 0.61–1.24)
GFR, Estimated: 18 mL/min — ABNORMAL LOW (ref >60)
GFR, Estimated: 17 mL/min — ABNORMAL LOW (ref >60)
GFR, Estimated: 18 mL/min — ABNORMAL LOW (ref >60)
Glucose, Bld: 311 mg/dL — ABNORMAL HIGH (ref 70–99)
Glucose, Bld: 281 mg/dL — ABNORMAL HIGH (ref 70–99)
Glucose, Bld: 208 mg/dL — ABNORMAL HIGH (ref 70–99)
Potassium: 5.3 mmol/L — ABNORMAL HIGH (ref 3.5–5.1)
Potassium: 5.2 mmol/L — ABNORMAL HIGH (ref 3.5–5.1)
Potassium: 5.1 mmol/L (ref 3.5–5.1)
Sodium: 134 mmol/L — ABNORMAL LOW (ref 135–145)
Sodium: 134 mmol/L — ABNORMAL LOW (ref 135–145)
Sodium: 135 mmol/L (ref 135–145)

## 2020-05-26 LAB — BLOOD GAS, VENOUS
Acid-base deficit: 2.7 mmol/L — ABNORMAL HIGH (ref 0.0–2.0)
Bicarbonate: 26.8 mmol/L (ref 20.0–28.0)
FIO2: 21
O2 Saturation: 49.9 %
Patient temperature: 98.6
pCO2, Ven: 74.4 mmHg (ref 44.0–60.0)
pH, Ven: 7.182 — CL (ref 7.250–7.430)
pO2, Ven: 34.2 mmHg (ref 32.0–45.0)

## 2020-05-26 LAB — BETA-HYDROXYBUTYRIC ACID
Beta-Hydroxybutyric Acid: 0.15 mmol/L (ref 0.05–0.27)
Beta-Hydroxybutyric Acid: 2.85 mmol/L — ABNORMAL HIGH (ref 0.05–0.27)
Beta-Hydroxybutyric Acid: 4.45 mmol/L — ABNORMAL HIGH (ref 0.05–0.27)

## 2020-05-26 LAB — CBC
HCT: 28 % — ABNORMAL LOW (ref 39.0–52.0)
Hemoglobin: 8.2 g/dL — ABNORMAL LOW (ref 13.0–17.0)
MCH: 30.4 pg (ref 26.0–34.0)
MCHC: 29.3 g/dL — ABNORMAL LOW (ref 30.0–36.0)
MCV: 103.7 fL — ABNORMAL HIGH (ref 80.0–100.0)
Platelets: 79 10*3/uL — ABNORMAL LOW (ref 150–400)
RBC: 2.7 MIL/uL — ABNORMAL LOW (ref 4.22–5.81)
RDW: 16.1 % — ABNORMAL HIGH (ref 11.5–15.5)
WBC: 19.1 10*3/uL — ABNORMAL HIGH (ref 4.0–10.5)
nRBC: 0 % (ref 0.0–0.2)

## 2020-05-26 LAB — CBG MONITORING, ED
Glucose-Capillary: 291 mg/dL — ABNORMAL HIGH (ref 70–99)
Glucose-Capillary: 222 mg/dL — ABNORMAL HIGH (ref 70–99)
Glucose-Capillary: 258 mg/dL — ABNORMAL HIGH (ref 70–99)
Glucose-Capillary: 186 mg/dL — ABNORMAL HIGH (ref 70–99)

## 2020-05-26 LAB — CREATININE, URINE, RANDOM: Creatinine, Urine: 79.52 mg/dL

## 2020-05-26 LAB — HEPARIN LEVEL (UNFRACTIONATED)
Heparin Unfractionated: >2.2 IU/mL — ABNORMAL HIGH (ref 0.30–0.70)
Heparin Unfractionated: >2.2 IU/mL — ABNORMAL HIGH (ref 0.30–0.70)

## 2020-05-26 LAB — EXPECTORATED SPUTUM ASSESSMENT W GRAM STAIN, RFLX TO RESP C

## 2020-05-26 LAB — HIV ANTIBODY (ROUTINE TESTING W REFLEX): HIV Screen 4th Generation wRfx: NONREACTIVE

## 2020-05-26 LAB — STREP PNEUMONIAE URINARY ANTIGEN: Strep Pneumo Urinary Antigen: NEGATIVE

## 2020-05-26 LAB — SODIUM, URINE, RANDOM: Sodium, Ur: <10 mmol/L

## 2020-05-26 LAB — MRSA PCR SCREENING: MRSA by PCR: NEGATIVE

## 2020-05-26 LAB — APTT: aPTT: 52 seconds — ABNORMAL HIGH (ref 24–36)

## 2020-05-26 MED ORDER — GLYCOPYRROLATE 0.2 MG/ML IJ SOLN: 0.2000 mg | INTRAMUSCULAR | Status: DC | PRN

## 2020-05-26 MED ORDER — ACETAMINOPHEN 650 MG RE SUPP: 650.0000 mg | Freq: Four times a day (QID) | RECTAL | Status: DC | PRN

## 2020-05-26 MED ORDER — ONDANSETRON HCL 4 MG/2ML IJ SOLN: 4.0000 mg | Freq: Four times a day (QID) | INTRAMUSCULAR | Status: DC | PRN

## 2020-05-26 MED ORDER — IPRATROPIUM-ALBUTEROL 0.5-2.5 (3) MG/3ML IN SOLN
3.0000 mL | Freq: Four times a day (QID) | RESPIRATORY_TRACT | Status: DC
  Administered 2020-05-26: 3 mL via RESPIRATORY_TRACT
  Filled 2020-05-26: qty 3

## 2020-05-26 MED ORDER — LACTATED RINGERS IV SOLN: INTRAVENOUS | Status: DC

## 2020-05-26 MED ORDER — ACETAMINOPHEN 325 MG PO TABS: 650.0000 mg | ORAL_TABLET | Freq: Four times a day (QID) | ORAL | Status: DC | PRN

## 2020-05-26 MED ORDER — ENSURE ENLIVE PO LIQD: 237.0000 mL | Freq: Two times a day (BID) | ORAL | Status: DC

## 2020-05-26 MED ORDER — SODIUM CHLORIDE 0.9 % IV SOLN
INTRAVENOUS | Status: DC | PRN
  Administered 2020-05-26: 250 mL via INTRAVENOUS

## 2020-05-26 MED ORDER — INSULIN DETEMIR 100 UNIT/ML ~~LOC~~ SOLN
15.0000 [IU] | Freq: Two times a day (BID) | SUBCUTANEOUS | Status: DC
  Administered 2020-05-26: 15 [IU] via SUBCUTANEOUS
  Filled 2020-05-26 (×2): qty 0.15

## 2020-05-26 MED ORDER — MORPHINE 100MG IN NS 100ML (1MG/ML) PREMIX INFUSION
1.0000 mg/h | INTRAVENOUS | Status: DC
  Administered 2020-05-26: 1 mg/h via INTRAVENOUS
  Filled 2020-05-26: qty 100

## 2020-05-26 MED ORDER — CHLORHEXIDINE GLUCONATE CLOTH 2 % EX PADS
6.0000 | MEDICATED_PAD | Freq: Every day | CUTANEOUS | Status: DC
  Administered 2020-05-26: 6 via TOPICAL

## 2020-05-26 MED ORDER — ORAL CARE MOUTH RINSE
15.0000 mL | Freq: Two times a day (BID) | OROMUCOSAL | Status: DC
  Administered 2020-05-26 (×2): 15 mL via OROMUCOSAL

## 2020-05-26 MED ORDER — MORPHINE SULFATE (PF) 2 MG/ML IV SOLN
2.0000 mg | INTRAVENOUS | Status: DC | PRN
  Administered 2020-05-26: 2 mg via INTRAVENOUS
  Filled 2020-05-26: qty 1

## 2020-05-26 MED ORDER — DEXTROSE 50 % IV SOLN: 0.0000 mL | INTRAVENOUS | Status: DC | PRN

## 2020-05-26 MED ORDER — BIOTENE DRY MOUTH MT LIQD: 15.0000 mL | OROMUCOSAL | Status: DC | PRN

## 2020-05-26 MED ORDER — ALBUTEROL SULFATE HFA 108 (90 BASE) MCG/ACT IN AERS
2.0000 | INHALATION_SPRAY | Freq: Four times a day (QID) | RESPIRATORY_TRACT | Status: DC
  Administered 2020-05-26 (×2): 2 via RESPIRATORY_TRACT
  Filled 2020-05-26 (×2): qty 6.7

## 2020-05-26 MED ORDER — ONDANSETRON 4 MG PO TBDP: 4.0000 mg | ORAL_TABLET | Freq: Four times a day (QID) | ORAL | Status: DC | PRN

## 2020-05-26 MED ORDER — ONDANSETRON HCL 4 MG PO TABS: 4.0000 mg | ORAL_TABLET | Freq: Four times a day (QID) | ORAL | Status: DC | PRN

## 2020-05-26 MED ORDER — VANCOMYCIN HCL IN DEXTROSE 1-5 GM/200ML-% IV SOLN: 1000.0000 mg | INTRAVENOUS | Status: DC

## 2020-05-26 MED ORDER — MORPHINE SULFATE (PF) 2 MG/ML IV SOLN
2.0000 mg | Freq: Once | INTRAVENOUS | Status: AC
  Administered 2020-05-26: 2 mg via INTRAVENOUS
  Filled 2020-05-26: qty 1

## 2020-05-26 MED ORDER — HALOPERIDOL 1 MG PO TABS: 0.5000 mg | ORAL_TABLET | ORAL | Status: DC | PRN

## 2020-05-26 MED ORDER — INSULIN ASPART 100 UNIT/ML ~~LOC~~ SOLN: 0.0000 [IU] | SUBCUTANEOUS | Status: DC

## 2020-05-26 MED ORDER — ALBUTEROL SULFATE HFA 108 (90 BASE) MCG/ACT IN AERS: 2.0000 | INHALATION_SPRAY | RESPIRATORY_TRACT | Status: DC | PRN

## 2020-05-26 MED ORDER — POLYVINYL ALCOHOL 1.4 % OP SOLN
1.0000 [drp] | Freq: Four times a day (QID) | OPHTHALMIC | Status: DC | PRN
  Filled 2020-05-26: qty 15

## 2020-05-26 MED ORDER — TOCILIZUMAB 400 MG/20ML IV SOLN: 8.0000 mg/kg | Freq: Once | INTRAVENOUS | Status: DC

## 2020-05-26 MED ORDER — GLYCOPYRROLATE 1 MG PO TABS: 1.0000 mg | ORAL_TABLET | ORAL | Status: DC | PRN

## 2020-05-26 MED ORDER — MORPHINE BOLUS VIA INFUSION
2.0000 mg | INTRAVENOUS | Status: DC | PRN
  Administered 2020-05-26: 2 mg via INTRAVENOUS
  Filled 2020-05-26: qty 2

## 2020-05-26 MED ORDER — HALOPERIDOL LACTATE 5 MG/ML IJ SOLN: 0.5000 mg | INTRAMUSCULAR | Status: DC | PRN

## 2020-05-26 MED ORDER — HALOPERIDOL LACTATE 2 MG/ML PO CONC
0.5000 mg | ORAL | Status: DC | PRN
  Filled 2020-05-26: qty 0.3

## 2020-05-26 MED ORDER — MORPHINE SULFATE (PF) 4 MG/ML IV SOLN
4.0000 mg | INTRAVENOUS | Status: DC | PRN
  Administered 2020-05-26: 4 mg via INTRAVENOUS
  Filled 2020-05-26: qty 1

## 2020-05-26 MED ORDER — DEXTROSE IN LACTATED RINGERS 5 % IV SOLN: INTRAVENOUS | Status: DC

## 2020-05-26 MED ORDER — INSULIN REGULAR(HUMAN) IN NACL 100-0.9 UT/100ML-% IV SOLN
INTRAVENOUS | Status: DC
  Filled 2020-05-26: qty 100

## 2020-05-26 NOTE — Progress Notes (Signed)
Pt. Transported from ED to SD/ICU with no complications.

## 2020-05-26 NOTE — Progress Notes (Signed)
Initial Nutrition Assessment  DOCUMENTATION CODES:   Not applicable  INTERVENTION:  - diet advancement as medically feasible.   NUTRITION DIAGNOSIS:   Increased nutrient needs related to acute illness,catabolic illness (DXIPJ-82 infection) as evidenced by estimated needs.  GOAL:   Patient will meet greater than or equal to 90% of their needs  MONITOR:   Diet advancement,Labs,Weight trends,Skin  REASON FOR ASSESSMENT:   Malnutrition Screening Tool  ASSESSMENT:   75 year old male with medical history of limited stage small cell lung cancer, afib, HLD, type 2 DM, stage 3 CKD, anemia of chronic disease and recent hospitalization at [facility] for COVID-19. He presented to the ED with progressively worsening generalized weakness, SOB, cough, and decreased appetite.  On admission he was dx with acute hypoxic respiratory failure 2/2 possible aspiration +/- bacterial pneumonia and DKA.  He has been NPO since admission. H&P information indicates possible dx of aspiration PNA. SLP was unable to work with patient late this AM d/t plan for patient to be placed on BiPAP.   Weight today is 211 lb and weight yesterday, which appears to be a stated weight, was 190 lb. Yesterday's weight was more consistent with weight recordings over the past 3 months. Mild pitting edema to BLE documented in the edema section of flow sheet.   Patient was evaluated by this RD on 03/12/20 at which time he was on a Carb Modified, thin liquids diet and he was eating 100% of meals.   Per notes: - DNR/DNI - overall poor prognosis - acute hypoxic and hypercarbic respiratory failure d/t recent COVID-19 infection - concern for aspiration PNA - DKA - AKI - bilateral pleural effusions - limited stage small cell lung cancer   Labs reviewed; CBGs: 95-291 mg/dl, Na: 134 mmol/l, K: 5.2 mmol/l, Cl: 97 mmol/l, BUN: 86 mg/dl, creatinine: 3.53 mg/dl, Ca: 7.9 mg/dl, GFR: 17 ml/min. Medications reviewed; sliding scale  novolog, 15 units levemir BID. IVF; D5-LR @ 100 ml/hr (408 kcal/24 hours).    NUTRITION - FOCUSED PHYSICAL EXAM:  unable to complete at this time.   Diet Order:   Diet Order            Diet NPO time specified Except for: Sips with Meds, Ice Chips  Diet effective now                 EDUCATION NEEDS:   Not appropriate for education at this time  Skin:  Skin Assessment: Skin Integrity Issues: Skin Integrity Issues:: DTI,Stage II DTI: sacrum Stage II: coccyx  Last BM:  2/14 (type 6 x2)  Height:   Ht Readings from Last 1 Encounters:  05/26/20 5\' 11"  (1.803 m)    Weight:   Wt Readings from Last 1 Encounters:  05/26/20 95.9 kg    Estimated Nutritional Needs:  Kcal:  2200-2400 kcal Protein:  125-140 grams Fluid:  >/= 2.8 L/day      Jarome Matin, MS, RD, LDN, CNSC Inpatient Clinical Dietitian RD pager # available in [place]  After hours/weekend pager # available in [facility]

## 2020-05-26 NOTE — Progress Notes (Signed)
ANTICOAGULATION CONSULT NOTE - Follow Up Consult  Pharmacy Consult for Heparin Indication: pulmonary embolus  Allergies  Allergen Reactions  . Tetracycline Itching and Rash    Patient Measurements: Height: 5\' 11"  (180.3 cm) Weight: 95.9 kg (211 lb 6.7 oz) IBW/kg (Calculated) : 75.3 Heparin Dosing Weight: 94.7 kg  Vital Signs: Temp: 97.2 F (36.2 C) (02/14 0800) Temp Source: Axillary (02/14 0800) BP: 109/40 (02/14 1036) Pulse Rate: 61 (02/14 1036)  Labs: Recent Labs    06/03/2020 1939 05/22/2020 2145 05/22/2020 2204 05/31/2020 2300 05/26/20 0050 05/26/20 0420 05/26/20 0804 05/26/20 0956  HGB 8.2*  --   --   --   --  8.2*  --   --   HCT 27.8*  --   --   --   --  28.0*  --   --   PLT 89*  --   --   --   --  79*  --   --   APTT  --  32  --   --   --   --   --  52*  LABPROT  --  30.9*  --   --   --   --   --   --   INR  --  3.1*  --   --   --   --   --   --   HEPARINUNFRC  --   --  >2.20*  --   --   --   --  >2.20*  CREATININE 3.52*  --   --    < > 3.51* 3.53* 3.42*  --    < > = values in this interval not displayed.    Estimated Creatinine Clearance: 22.4 mL/min (A) (by C-G formula based on SCr of 3.42 mg/dL (H)).   Medications:  Scheduled:  . Chlorhexidine Gluconate Cloth  6 each Topical Q0600  . feeding supplement  237 mL Oral BID BM  . insulin aspart  0-15 Units Subcutaneous Q4H  . insulin detemir  15 Units Subcutaneous BID  . ipratropium-albuterol  3 mL Nebulization Q6H  . mouth rinse  15 mL Mouth Rinse BID  .  morphine injection  2 mg Intravenous Once   Infusions:  . sodium chloride 10 mL/hr at 05/26/20 1100  . ceFEPime (MAXIPIME) IV    . dextrose 5% lactated ringers 100 mL/hr at 05/26/20 1100  . heparin 1,300 Units/hr (05/26/20 1100)  . insulin 0.9 mL/hr at 05/26/20 1100  . lactated ringers Stopped (05/26/20 0311)  . metronidazole Stopped (05/26/20 0854)  . [START ON 05-30-2020] vancomycin     PRN: sodium chloride, acetaminophen, albuterol, dextrose,  morphine injection, ondansetron **OR** ondansetron (ZOFRAN) IV  Assessment: 75 year old male comes in a chief complaint of shortness of breath. He has history of CKD, hypertension, hyperlipidemia, lung cancer.  Pt d/c'd from hospital a week ago after suffering from [place]. Pharmacy consulted to dose heparin for PE. Pt takes apixaban for Afib, LD 2/13 am.  EDP discussed case with Dr. [name], oncology, recommends to start hepairn despite low platelets.  05/26/2020 11:22 AM  Heparin > 2.2, supratherapeutic as expected with recent apixaban use  PTT 52, subtherapeutic on heparin 1300 units/hr  CBC: Hgb stable 8.2, Plts decreased 79k  No bleeding reported  Goal of Therapy:  Heparin level 0.3-0.7 units/ml  PTT 66-102 seconds Monitor platelets by anticoagulation protocol: Yes   Plan:   Increase heparin drip to 1450 units/hr  Recheck heparin level in 8hrs  Daily heparin level and CBC  Monitor for signs/symptoms of bleeding  Peggyann Juba, PharmD, BCPS Pharmacy: [phone] 05/26/2020 11:25 AM

## 2020-05-26 NOTE — Progress Notes (Signed)
RT NOTE:  Held CPT due to pt current respiratory status. Will reevaluate later today. RT will continue to monitor.

## 2020-05-26 NOTE — Progress Notes (Signed)
Confirmed with MD that temp checks were not needed dt patient transitioning to comfort

## 2020-05-26 NOTE — TOC Initial Note (Signed)
Transition of Care [facility]) - Initial/Assessment Note    Patient Details  Name: [name] [name]: [id number] Date of Birth: [birth year]  Transition of Care [facility]) CM/SW Contact:    Leeroy Cha, RN Phone Number: 05/26/2020, 7:43 AM  Clinical Narrative:                  75 year old male with past medical history of metastatic small cell lung cancer, paroxysmal atrial fibrillation, hyperlipidemia, diabetes mellitus type 2, chronic kidney disease stage IIIa, anemia of chronic disease and recent hospitalization at [facility] for COVID-19 who presents to [facility] emergency department with progressively worsening generalized weakness shortness of breath cough and appetite.  Of note, patient was hospitalized at [facility] long hospital from 2/2 into 2/9 for COVID-19 infection during that hospitalization, patient was managed with a course of intravenous remdesivir and Solu-Medrol. At time of discharge, patient was transitioned to home-going oral prednisone. Arrangements were also made for the patient to be discharged on home-going oxygen.  Patient explains that since he arrived home his generalized weakness has progressively worsened day by day. Is associated with worsening cough productive of thick yellow sputum and worsening shortness of breath. Shortness breath is severe in intensity worse with exertion and improved with rest. Patient explains that he has had no appetite over this span of time. Patient denies fever. Denies chest pain.  Patient symptoms continue to worsen until EMS was called. Upon EMS evaluation patient was found to be hypoxic with saturations in the 80s and was promptly brought into [facility] emergency department for evaluation.  Upon evaluation in the emergency department, patient has been found to be in diabetic ketoacidosis and has been initiated on an insulin infusion. Patient is also been identified to be suffering from acute kidney  injury superimposed on chronic kidney disease with concurrent hyperkalemia. Patient's been initiated on intravenous fluids,  and intravenous bicarbonate. Respiratory status progressively worsened throughout the emergency department stay with increasing oxygen requirements and patient eventually being transitioned to high flow oxygen delivery. Patient was also initiated on a heparin infusion due to concerns for development of a pulmonary embolism despite ongoing Eliquis use. PLAN: TO RETURN TO HOME WITH PREVIOUS DME-O2 AND TO FAMILY. Expected Discharge Plan: [place] Barriers to Discharge: Continued Medical Work up   Patient Goals and CMS Choice Patient states their goals for this hospitalization and ongoing recovery are:: TO GET BETTER AND [place] CMS Medicare.gov Compare Post Acute Care list provided to:: Patient    Expected Discharge Plan and Services Expected Discharge Plan: [place]   Discharge Planning Services: CM Consult Post Acute Care Choice: [place] arrangements for the past 2 months: Single Family Home                                      Prior Living Arrangements/Services Living arrangements for the past 2 months: Single Family Home Lives with:: Relatives Patient language and need for interpreter reviewed:: Yes Do you feel safe going back to the place where you live?: Yes      Need for Family Participation in Patient Care: Yes (Comment) Care giver support system in place?: Yes (comment) Current home services: DME Criminal Activity/Legal Involvement Pertinent to Current Situation/Hospitalization: No - Comment as needed  Activities of Daily Living Home Assistive Devices/Equipment: Oxygen,Eyeglasses,Dentures (specify type),Wheelchair (upper and  lower dentures) ADL Screening (condition at time of admission) Patient's cognitive ability adequate to safely complete daily activities?: No Is  the patient deaf or have difficulty hearing?: Yes Does the patient have difficulty seeing, even when wearing glasses/contacts?: Yes Does the patient have difficulty concentrating, remembering, or making decisions?: Yes Patient able to express need for assistance with ADLs?: Yes Does the patient have difficulty dressing or bathing?: Yes Independently performs ADLs?: Yes (appropriate for developmental age) Does the patient have difficulty walking or climbing stairs?: Yes Weakness of Legs: Both Weakness of Arms/Hands: None  Permission Sought/Granted                  Emotional Assessment Appearance:: Appears stated age Attitude/Demeanor/Rapport: Engaged Affect (typically observed): Calm Orientation: : Oriented to Place,Oriented to Self,Oriented to  Time,Oriented to Situation Alcohol / Substance Use: Not Applicable Psych Involvement: No (comment)  Admission diagnosis:  Acute respiratory failure with hypoxia ([place]) [J96.01] AKI (acute kidney injury) ([place]) [N17.9] Diabetic ketoacidosis without coma associated with type 1 diabetes mellitus ([place]) [E10.10] Patient Active Problem List   Diagnosis Date Noted  . Elevated d-dimer 05/26/2020  . Acute respiratory failure with hypoxia ([place]) 05/24/2020  . Diabetic ketoacidosis without coma associated with type 2 diabetes mellitus ([place]) 05/23/2020  . Hyperkalemia 05/18/2020  . Stenosis of right bronchus 05/23/2020  . Thrombocytopenia ([place]) 05/31/2020  . COVID-19 virus infection 05/14/2020  . SOB (shortness of breath) 05/14/2020  . Generalized weakness 05/14/2020  . Pleural effusion, bilateral 05/14/2020  . DM2 (diabetes mellitus, type 2) ([place]) 05/14/2020  . Hyperlipidemia 05/14/2020  . AF (paroxysmal atrial fibrillation) ([place]) 03/24/2020  . Atrial fibrillation with RVR ([place]) 03/24/2020  . Secondary hypercoagulable state ([place]) 03/19/2020  . Typical atrial flutter ([place]) 03/11/2020  . Antineoplastic chemotherapy induced pancytopenia ([place])  03/11/2020  . Severe protein-calorie malnutrition ([place]) 02/12/2020  . Chemotherapy induced neutropenia ([place])   . Severe sepsis ([place]) 11/22/2019  . Neutropenic fever ([place]) 11/22/2019  . Acute renal failure superimposed on stage 3a chronic kidney disease ([place]) 11/22/2019  . Non-small cell carcinoma of right lung, stage 3 ([place]) 10/23/2019  . Goals of care, counseling/discussion 10/23/2019  . Encounter for antineoplastic chemotherapy 10/23/2019  . Small cell lung cancer ([place]) 10/12/2019  . [place] 03/15/2019  . Mild cognitive impairment 05/08/2018  . Esophageal dysphagia   . Diabetic foot ulcer ([place]) 06/10/2017  . Uncontrolled type 2 diabetes mellitus with peripheral neuropathy ([place]) 05/11/2016  . Erectile dysfunction associated with type 2 diabetes mellitus ([place]) 05/28/2015  . Former very heavy cigarette smoker (more than 40 per day) 10/07/2014  . Chronic kidney disease, stage III (moderate) ([place]) 12/19/2012  . Hypogonadism male 06/14/2012  . OSA (obstructive sleep apnea) 03/09/2011  . Diabetic neuropathy ([place]) 12/29/2010  . Family history of MI (myocardial infarction) 09/16/2010  . HIP REPLACEMENT, RIGHT, HX OF 02/05/2010  . Hypertriglyceridemia 06/13/2008  . GOUT 06/13/2008  . Major depressive disorder, recurrent episode, in partial remission ([place]) 06/13/2008  . Essential hypertension 06/13/2008  . GERD 06/13/2008  . OSTEOARTHRITIS 06/13/2008   PCP:  [name], MD Pharmacy:   [id number] [place], [place] [address] Phone: [phone] Fax: [phone]     Social Determinants of Health (SDOH) Interventions    Readmission Risk Interventions No flowsheet data found.

## 2020-05-26 NOTE — Consult Note (Signed)
NAME:  GERRALD BASU, [name]:  [id number], DOB:  05-Oct-1945, LOS: 1 ADMISSION DATE:  06/02/2020, CONSULTATION DATE:  05/26/20 REFERRING MD:  Melven Sartorius, MD CHIEF COMPLAINT:  Respiratory failure  Brief History:  [name] is a 75 year old male with metastatic small cell lung cancer, paroxysmal atrial fibrillation, diabetes melltius type II, chronic kidney disease stage IIIa and recent hospitalization 2/2 to 2/9 for COVID 19 who presented to [facility] on 05/20/2020 with worsening generalized weakness, shortness of breath and cough.   History of Present Illness:  [name] is a 75 year old male with metastatic small cell lung cancer, paroxysmal atrial fibrillation, diabetes melltius type II, chronic kidney disease stage IIIa and recent hospitalization 2/2 to 2/9 for COVID 19 who presented to [facility] on 05/21/2020 with worsening generalized weakness, shortness of breath and cough.   He has been admitted by the hospitalist service for acute hypoxemic respiratory failure requiring HFNC and Bipap.   CT Chest imgaging 05/24/2020 shows complete occlusion of the right main stem bronchus compared to CTA chest on 05/14/20. There appears to be debris in the right main stem bronchus. Loculated right pleural effusion has increased in size. There is moderate left pleural effusion with new air space opacity in left lower lobe.  PCCM has been consulted on further recommendations for his respiratory failure.   Past Medical History:  Small Cell Lung Cancer  Hypertension Hyperlipidemia GERD GOUT Diabetes Mellitus Type II CKD IIIa Diverticulosis  Significant Hospital Events:  2/13 Admitted to step down unit - requiring HFNC/Bipap  Consults:  PCCM Palliative Care  Procedures:    Significant Diagnostic Tests:  CT Chest 05/18/2020 1. Large partially loculated right pleural effusion has increased in size from prior exam. 2. Complete filling of right bronchus intermedius and near complete right lower and middle  lobe collapse. 3. Right perihilar consolidation has worsened. Cavitary lesion in the right upper lobe with air-fluid level, diminished air component from prior. Borders are less well-defined on the current exam making size comparison difficult. 4. Left lower lobe airspace disease appears more consolidative and ground-glass on the previous compressive atelectasis, and is suspicious for pneumonia. Moderate left pleural effusion is similar. 5. New 9 mm lingular paramediastinal nodule, indeterminate. The previous peripheral left lung nodules have resolved. 6. Right anterolateral fourth rib fracture with question of underlying lucency, this may be due to subacute fracture or underlying lesion. 7. Increased body wall edema from prior exam.  Micro Data:  Urine Cx 2/14 >> Resp Cx 2/14>> Bld Cx 2/13>> MRSA PCR - negative  Antimicrobials:  Cefepime 2/13>> Vancomycin 2/13>> Voriconazole 2/14>> Flagyl 2/14>>  Interim History / Subjective:  Patient is alert and without pain. He is short of breath with conversation. He can only speak a couple of words at a time.   Objective   Blood pressure (!) 97/54, pulse (!) 59, temperature (!) 96.6 F (35.9 C), temperature source Axillary, resp. rate 16, height 5\' 11"  (1.803 m), weight 95.9 kg, SpO2 96 %.        Intake/Output Summary (Last 24 hours) at 05/26/2020 0917 Last data filed at 05/26/2020 0859 Gross per 24 hour  Intake 2492.76 ml  Output --  Net 2492.76 ml   Filed Weights   05/28/2020 1931 05/26/20 0516  Weight: 86.2 kg 95.9 kg    Examination: General: elderly male, in respiratory distress using accessory muscles HENT: [place]/AT, PERRL, moist mucous membranes Lungs: diminished to absent on the right, rhonchi on the left Cardiovascular: tachycardic, no  murmurs Abdomen: soft, non-tender, non-distended Extremities: bilateral 1+ edema present Neuro: somnolent but arousable, moving all extremities GU: Foley in place  Resolved Hospital  Problem list     Assessment & Plan:  Acute Hypoxemic and Hypercapnic Respiratory failure In setting of known small cell lung cancer and recent covid 19 infection. Etiology of his respiratory failure includes new bacterial pneumonia vs aspiration vs progression of his non-small cell cancer.  - Continue HFNC and bipap as needed - Continue cefepime, vancomycin and flagyl - Recommend RT chest physiotherapy for secretion clearance.  - Duonebs q6hours, will consider adding hypertonic nebs if needed - Morphine 2mg  q4hours as needed for dyspnea - Recommend intermittent bipap and HFNC throughout the day  Bilateral Pleural Effusions - Left pleural effusion is small on bedside US, and thoracentesis would not provide benefit to his current respiratory failure.  - Right pleural effusion is loculated with necrotic areas of the right lung. Would avoid thoracentesis or chest tube placement here due to risk for bronchopleural fistula. Also, would not gain much surface area of lung from expansion as the right lung is full of small cell cancer.   Acute on Chronic Kidney Disease IIIa - Baseline ~ Cr 1.1, Cr 3.78 on admission - Likely ATN in setting of hypoxemia - Check urine lytes - Has been fluid resuscitated - Continue to monitor renal funciton  Overall prognosis is very poor at this time. He is in multiorgan failure with a progressive cancer. Primary team is updating patient's family. Palliative Care has been consulted. Would favor comfort measures are there is high concern patient will not survive this hospitalization.   Best practice (evaluated daily)  Diet: NPO Pain/Anxiety/Delirium protocol (if indicated): morphine PRN VAP protocol (if indicated): n/a DVT prophylaxis: heparin GI prophylaxis: PPI Glucose control: insulin gtt Mobility: bed rest Disposition:ICU  Goals of Care:  Last date of multidisciplinary goals of care discussion: Primary Team discussing with family 2/14 Family and staff  present:  Summary of discussion:  Follow up goals of care discussion due:  Code Status: DNR  Labs   CBC: Recent Labs  Lab 05/20/20 0141 05/21/20 0434 06/08/2020 1939 05/26/20 0420  WBC 7.9 10.6* 26.4* 19.1*  NEUTROABS 7.4  --  25.0*  --   HGB 9.4* 9.4* 8.2* 8.2*  HCT 30.7* 31.2* 27.8* 28.0*  MCV 99.4 101.6* 105.3* 103.7*  PLT 141* 125* 89* 79*    Basic Metabolic Panel: Recent Labs  Lab 05/20/20 0141 05/21/20 0434 06/09/2020 1939 05/17/2020 2300 05/26/20 0050 05/26/20 0420 05/26/20 0804  NA 139 142 131* 136 134* 134* 135  K 3.9 5.0 6.1* 5.7* 5.3* 5.2* 5.1  CL 99 100 95* 98 97* 97* 98  CO2 29 32 15* 19* 19* 23 25  GLUCOSE 58* 97 415* 404* 311* 281* 208*  BUN 44* 43* 89* 79* 87* 86* 90*  CREATININE 1.31* 1.14 3.52* 3.78* 3.51* 3.53* 3.42*  CALCIUM 8.7* 8.8* 8.2* 8.1* 7.9* 7.9* 7.8*  MG 1.9 2.1  --   --   --   --   --   PHOS  --  2.9  --   --   --   --   --    GFR: Estimated Creatinine Clearance: 22.4 mL/min (A) (by C-G formula based on SCr of 3.42 mg/dL (H)). Recent Labs  Lab 05/20/20 0141 05/21/20 0434 06/03/2020 1939 05/26/20 0420  PROCALCITON  --   --  11.80  --   WBC 7.9 10.6* 26.4* 19.1*  LATICACIDVEN  --   --  1.7  --     Liver Function Tests: Recent Labs  Lab 05/20/20 0141 05/21/20 0434 06/01/2020 1939  AST 19 20 10*  ALT 30 37 20  ALKPHOS 86 96 113  BILITOT 0.8 0.5 2.2*  PROT 6.2* 6.3* 5.8*  ALBUMIN 2.8* 2.7* 2.2*   No results for input(s): LIPASE, AMYLASE in the last 168 hours. No results for input(s): AMMONIA in the last 168 hours.  ABG    Component Value Date/Time   PHART 7.160 (LL) 05/29/2020 0010   PCO2ART 57.1 (H) 06/06/2020 0010   PO2ART 86.9 05/31/2020 0010   HCO3 16.9 (L) 06/01/2020 2148   TCO2 18 (L) 08/04/2018 1448   ACIDBASEDEF 10.2 (H) 05/29/2020 2148   O2SAT 84.0 05/24/2020 2148     Coagulation Profile: Recent Labs  Lab 06/06/2020 2145  INR 3.1*    Cardiac Enzymes: No results for input(s): CKTOTAL, CKMB, CKMBINDEX,  TROPONINI in the last 168 hours.  HbA1C: Hgb A1c MFr Bld  Date/Time Value Ref Range Status  05/15/2020 04:52 AM 8.8 (H) 4.8 - 5.6 % Final    Comment:    (NOTE) Pre diabetes:          5.7%-6.4%  Diabetes:              >6.4%  Glycemic control for   <7.0% adults with diabetes   03/11/2020 09:53 PM 8.7 (H) 4.8 - 5.6 % Final    Comment:    (NOTE) Pre diabetes:          5.7%-6.4%  Diabetes:              >6.4%  Glycemic control for   <7.0% adults with diabetes     CBG: Recent Labs  Lab 05/26/20 0251 05/26/20 0410 05/26/20 0511 05/26/20 0628 05/26/20 0744  GLUCAP 222* 186* 138* 137* 137*    Review of Systems:   A 10 point review of systems was performed and is otherwise negative unless stated above in the HPI.  Past Medical History:  He,  has a past medical history of Allergic rhinitis due to pollen, Chronic kidney disease, stage III (moderate) ([place]) (12/19/2012), Depression, Diabetes mellitus ([place]), Diverticulosis, Erectile dysfunction associated with type 2 diabetes mellitus ([place]) (05/28/2015), Former very heavy cigarette smoker (more than 40 per day) (10/07/2014), GERD (gastroesophageal reflux disease), Gout, Hyperlipidemia, Hypertension, Hypogonadism male (06/14/2012), Memory loss, Osteoarthritis, Personal history of colonic polyps, and Small cell carcinoma of right lung ([place]) (10/23/2019).   Surgical History:   Past Surgical History:  Procedure Laterality Date  . BRONCHIAL NEEDLE ASPIRATION BIOPSY  10/17/2019   Procedure: BRONCHIAL NEEDLE ASPIRATION BIOPSIES;  Surgeon: Juanito Doom, MD;  Location: [facility];  Service: Cardiopulmonary;;  . CARDIAC CATHETERIZATION  11/2011   [facility]  . CHOLECYSTECTOMY  1980  . ENDOBRONCHIAL ULTRASOUND Bilateral 10/17/2019   Procedure: ENDOBRONCHIAL ULTRASOUND;  Surgeon: Juanito Doom, MD;  Location: [facility];  Service: Cardiopulmonary;  Laterality: Bilateral;  . ESOPHAGOGASTRODUODENOSCOPY (EGD) WITH PROPOFOL N/A 09/27/2017    Procedure: ESOPHAGOGASTRODUODENOSCOPY (EGD) WITH PROPOFOL;  Surgeon: Lin Landsman, MD;  Location: [facility];  Service: Gastroenterology;  Laterality: N/A;  . EYE SURGERY Right 07/29/2016   cataract - Dr. Manuella Ghazi  . EYE SURGERY Left 08/23/2106   cataract - Dr. Manuella Ghazi  . HEMOSTASIS CONTROL  10/17/2019   Procedure: HEMOSTASIS CONTROL;  Surgeon: Juanito Doom, MD;  Location: [facility];  Service: Cardiopulmonary;;  . IR IMAGING GUIDED PORT INSERTION  11/30/2019  . IRRIGATION AND DEBRIDEMENT ABSCESS Left 11/23/2019  Procedure: IRRIGATION AND DEBRIDEMENT ABSCESS;  Surgeon: Melina Schools, MD;  Location: [facility];  Service: Orthopedics;  Laterality: Left;  . TOTAL HIP ARTHROPLASTY  2009   Dr. Gladstone Lighter  . TOTAL HIP ARTHROPLASTY       Social History:   reports that he quit smoking about 20 years ago. His smoking use included cigarettes. He has a 105.00 pack-year smoking history. He has never used smokeless tobacco. He reports that he does not drink alcohol and does not use drugs.   Family History:  His family history includes Alzheimer's disease in his mother; COPD in his sister; Diabetes in his father and mother; Heart attack in his father and sister; Heart disease in his maternal grandfather and sister; Lung cancer in his father and sister; Stroke in his father.   Allergies Allergies  Allergen Reactions  . Tetracycline Itching and Rash     Home Medications  Prior to Admission medications   Medication Sig Start Date End Date Taking? Authorizing Provider  apixaban (ELIQUIS) 5 MG TABS tablet Take 1 tablet (5 mg total) by mouth 2 (two) times daily. 05/21/20  Yes Pokhrel, Laxman, MD  acetaminophen (TYLENOL) 650 MG CR tablet Take 650 mg by mouth every 8 (eight) hours as needed for pain.    [provider]  albuterol (VENTOLIN HFA) 108 (90 Base) MCG/ACT inhaler Inhale 1-2 puffs into the lungs every 6 (six) hours as needed for wheezing or shortness of breath. 02/15/20   Tanner, Lyndon Code.,  [name]  Ascorbic Acid (VITAMIN C) 1000 MG tablet Take 1,000 mg by mouth daily.    [provider]  atorvastatin (LIPITOR) 20 MG tablet Take 1 tablet (20 mg total) by mouth at bedtime. 05/21/20   Pokhrel, Corrie Mckusick, MD  cefdinir (OMNICEF) 300 MG capsule Take 300 mg by mouth 2 (two) times daily. Take for 10 days 05/14/20   [provider]  cholecalciferol (VITAMIN D) 1000 UNITS tablet Take 2,000 Units by mouth daily.    [provider]  diltiazem (CARDIZEM CD) 300 MG 24 hr capsule Take 1 capsule (300 mg total) by mouth daily. 05/21/20 06/20/20  Pokhrel, Corrie Mckusick, MD  donepezil (ARICEPT) 10 MG tablet TAKE 1 TABLET BY MOUTH AT BEDTIME 08/24/19   Copland, Frederico Hamman, MD  fluticasone (FLONASE) 50 MCG/ACT nasal spray Place 1 spray into both nostrils daily as needed for allergies.    [provider]  guaiFENesin (MUCINEX) 600 MG 12 hr tablet Take 600 mg by mouth daily as needed for cough.    [provider]  Hyprom-Naphaz-Polysorb-Zn Sulf (CLEAR EYES COMPLETE OP) Place 1 drop into both eyes daily as needed (dry eyes).    [provider]  Insulin Pen Needle (PEN NEEDLES) 31G X 5 MM MISC 1 each by Does not apply route 3 (three) times daily. To inject insulin E11.42 09/20/19   Philemon Kingdom, MD  insulin regular human CONCENTRATED (HUMULIN R U-500 KWIKPEN) 500 UNIT/ML kwikpen Inject 20 Units into the skin with breakfast, with lunch, and with evening meal.    [provider]  linagliptin (TRADJENTA) 5 MG TABS tablet Take 1 tablet (5 mg total) by mouth daily. 05/22/20   Pokhrel, Corrie Mckusick, MD  metoprolol tartrate 37.5 MG TABS Take 37.5 mg by mouth 2 (two) times daily. Patient taking differently: Take 75 mg by mouth daily. 05/21/20   Pokhrel, Corrie Mckusick, MD  montelukast (SINGULAIR) 10 MG tablet Take 1 tablet (10 mg total) by mouth at bedtime. 09/20/19   Jinny Sanders, MD  pantoprazole (PROTONIX)  20 MG tablet Take 1 tablet (20 mg total) by mouth daily. 01/21/20   Heilingoetter,  Cassandra L, [name]  simvastatin (ZOCOR) 40 MG tablet Take 40 mg by mouth daily.    [provider]  venlafaxine XR (EFFEXOR-XR) 75 MG 24 hr capsule TAKE 1 CAPSULE BY MOUTH EVERY MORNING AND 2 CAPSULES AT BEDTIME Patient taking differently: Take 75-150 mg by mouth in the morning and at bedtime. TAKE 1 CAPSULE BY MOUTH EVERY MORNING AND 2 CAPSULES AT BEDTIME 11/26/19   [name], MD     Critical care time: 95 minutes    Freda Jackson, MD [facility] Office: [phone]   See Amion for Pager Details

## 2020-05-26 NOTE — Progress Notes (Signed)
Pt removed from BIPAP and placed on 6 LPM [place] per family wishes.  Pt is currently comfort care only, RN aware.

## 2020-05-26 NOTE — Progress Notes (Signed)
Bilateral lower extremity venous study completed.   Technically difficult study due to inability to darken room for ultrasound.  When Rn was asked if there was a way she responded with "The lights are off."  The room has 2 large windows with no blinds or shades.    Please see CV Proc for preliminary results.   Vonzell Schlatter, RVT

## 2020-05-26 NOTE — ED Notes (Signed)
Spoke with Saralyn Pilar RT, states they will come and place patient on BiPAP

## 2020-05-26 NOTE — Plan of Care (Signed)
  Problem: Education: Goal: Knowledge of risk factors and measures for prevention of condition will improve Outcome: Progressing   Problem: Coping: Goal: Psychosocial and spiritual needs will be supported Outcome: Progressing   Problem: Respiratory: Goal: Will maintain a patent airway Outcome: Progressing Goal: Complications related to the disease process, condition or treatment will be avoided or minimized Outcome: Progressing   Problem: Education: Goal: Ability to describe self-care measures that may prevent or decrease complications (Diabetes Survival Skills Education) will improve Outcome: Progressing Goal: Individualized Educational Video(s) Outcome: Progressing   Problem: Cardiac: Goal: Ability to maintain an adequate cardiac output will improve Outcome: Progressing   Problem: Health Behavior/Discharge Planning: Goal: Ability to identify and utilize available resources and services will improve Outcome: Progressing Goal: Ability to manage health-related needs will improve Outcome: Progressing   Problem: Fluid Volume: Goal: Ability to achieve a balanced intake and output will improve Outcome: Progressing   Problem: Metabolic: Goal: Ability to maintain appropriate glucose levels will improve Outcome: Progressing   Problem: Nutritional: Goal: Maintenance of adequate nutrition will improve Outcome: Progressing Goal: Maintenance of adequate weight for body size and type will improve Outcome: Progressing   Problem: Respiratory: Goal: Will regain and/or maintain adequate ventilation Outcome: Progressing   Problem: Urinary Elimination: Goal: Ability to achieve and maintain adequate renal perfusion and functioning will improve Outcome: Progressing

## 2020-05-26 NOTE — H&P (Addendum)
History and Physical    [name] KVQ:[id number] DOB: 06-16-1945 DOA: 05/23/2020  PCP: [name], MD  Patient coming from: Home   Chief Complaint:  Chief Complaint  Patient presents with  . Shortness of Breath     HPI:    75 year old male with past medical history of metastatic small cell lung cancer, paroxysmal atrial fibrillation, hyperlipidemia, diabetes mellitus type 2, chronic kidney disease stage IIIa, anemia of chronic disease and recent hospitalization at [facility] for COVID-19 who presents to [facility] emergency department with progressively worsening generalized weakness shortness of breath cough and appetite.  Of note, patient was hospitalized at [facility] from 2/2 into 2/9 for COVID-19 infection during that hospitalization, patient was managed with a course of intravenous remdesivir and Solu-Medrol. At time of discharge, patient was transitioned to home-going oral prednisone. Arrangements were also made for the patient to be discharged on home-going oxygen.  Patient explains that since he arrived home his generalized weakness has progressively worsened day by day. Is associated with worsening cough productive of thick yellow sputum and worsening shortness of breath. Shortness breath is severe in intensity worse with exertion and improved with rest. Patient explains that he has had no appetite over this span of time. Patient denies fever. Denies chest pain.  Patient symptoms continue to worsen until EMS was called. Upon EMS evaluation patient was found to be hypoxic with saturations in the 80s and was promptly brought into [facility] emergency department for evaluation.  Upon evaluation in the emergency department, patient has been found to be in diabetic ketoacidosis and has been initiated on an insulin infusion. Patient is also been identified to be suffering from acute kidney injury superimposed on chronic kidney  disease with concurrent hyperkalemia. Patient's been initiated on intravenous fluids,  and intravenous bicarbonate. Respiratory status progressively worsened throughout the emergency department stay with increasing oxygen requirements and patient eventually being transitioned to high flow oxygen delivery. Patient was also initiated on a heparin infusion due to concerns for development of a pulmonary embolism despite ongoing Eliquis use. The hospitalist group was then called to assess the patient for admission to the hospital.  Review of Systems:   Review of Systems  Constitutional: Positive for malaise/fatigue.  Respiratory: Positive for cough and shortness of breath.   Gastrointestinal: Positive for nausea.  Neurological: Positive for weakness.  All other systems reviewed and are negative.   Past Medical History:  Diagnosis Date  . Allergic rhinitis due to pollen   . Chronic kidney disease, stage III (moderate) ([place]) 12/19/2012  . Depression   . Diabetes mellitus ([place])   . Diverticulosis   . Erectile dysfunction associated with type 2 diabetes mellitus ([place]) 05/28/2015  . Former very heavy cigarette smoker (more than 40 per day) 10/07/2014  . GERD (gastroesophageal reflux disease)   . Gout   . Hyperlipidemia   . Hypertension   . Hypogonadism male 06/14/2012  . Memory loss   . Osteoarthritis   . Personal history of colonic polyps    1996  . Small cell carcinoma of right lung ([place]) 10/23/2019    Past Surgical History:  Procedure Laterality Date  . BRONCHIAL NEEDLE ASPIRATION BIOPSY  10/17/2019   Procedure: BRONCHIAL NEEDLE ASPIRATION BIOPSIES;  Surgeon: Juanito Doom, MD;  Location: [facility] ENDOSCOPY;  Service: Cardiopulmonary;;  . CARDIAC CATHETERIZATION  11/2011   [facility]  . CHOLECYSTECTOMY  1980  . ENDOBRONCHIAL ULTRASOUND Bilateral 10/17/2019   Procedure: ENDOBRONCHIAL ULTRASOUND;  Surgeon:  Juanito Doom, MD;  Location: [facility];  Service: Cardiopulmonary;  Laterality:  Bilateral;  . ESOPHAGOGASTRODUODENOSCOPY (EGD) WITH PROPOFOL N/A 09/27/2017   Procedure: ESOPHAGOGASTRODUODENOSCOPY (EGD) WITH PROPOFOL;  Surgeon: Lin Landsman, MD;  Location: [facility];  Service: Gastroenterology;  Laterality: N/A;  . EYE SURGERY Right 07/29/2016   cataract - Dr. Manuella Ghazi  . EYE SURGERY Left 08/23/2106   cataract - Dr. Manuella Ghazi  . HEMOSTASIS CONTROL  10/17/2019   Procedure: HEMOSTASIS CONTROL;  Surgeon: Juanito Doom, MD;  Location: [facility]OSCOPY;  Service: Cardiopulmonary;;  . IR IMAGING GUIDED PORT INSERTION  11/30/2019  . IRRIGATION AND DEBRIDEMENT ABSCESS Left 11/23/2019   Procedure: IRRIGATION AND DEBRIDEMENT ABSCESS;  Surgeon: Melina Schools, MD;  Location: [facility]S;  Service: Orthopedics;  Laterality: Left;  . TOTAL HIP ARTHROPLASTY  2009   Dr. Gladstone Lighter  . TOTAL HIP ARTHROPLASTY       reports that he quit smoking about 20 years ago. His smoking use included cigarettes. He has a 105.00 pack-year smoking history. He has never used smokeless tobacco. He reports that he does not drink alcohol and does not use drugs.  Allergies  Allergen Reactions  . Tetracycline Itching and Rash    Family History  Problem Relation Age of Onset  . Diabetes Mother   . Alzheimer's disease Mother   . Diabetes Father   . Lung cancer Father        Age 100  . Stroke Father   . Heart attack Father   . Lung cancer Sister        lung  . Heart disease Maternal Grandfather   . COPD Sister   . Heart attack Sister   . Heart disease Sister      Prior to Admission medications   Medication Sig Start Date End Date Taking? Authorizing Provider  apixaban (ELIQUIS) 5 MG TABS tablet Take 1 tablet (5 mg total) by mouth 2 (two) times daily. 05/21/20  Yes Pokhrel, Laxman, MD  acetaminophen (TYLENOL) 650 MG CR tablet Take 650 mg by mouth every 8 (eight) hours as needed for pain.    [provider]  albuterol (VENTOLIN HFA) 108 (90 Base) MCG/ACT inhaler Inhale 1-2 puffs into the lungs  every 6 (six) hours as needed for wheezing or shortness of breath. 02/15/20   Tanner, Lyndon Code., [name]  Ascorbic Acid (VITAMIN C) 1000 MG tablet Take 1,000 mg by mouth daily.    [provider]  atorvastatin (LIPITOR) 20 MG tablet Take 1 tablet (20 mg total) by mouth at bedtime. 05/21/20   Pokhrel, Corrie Mckusick, MD  cefdinir (OMNICEF) 300 MG capsule Take 300 mg by mouth 2 (two) times daily. Take for 10 days 05/14/20   [provider]  cholecalciferol (VITAMIN D) 1000 UNITS tablet Take 2,000 Units by mouth daily.    [provider]  diltiazem (CARDIZEM CD) 300 MG 24 hr capsule Take 1 capsule (300 mg total) by mouth daily. 05/21/20 06/20/20  Pokhrel, Corrie Mckusick, MD  donepezil (ARICEPT) 10 MG tablet TAKE 1 TABLET BY MOUTH AT BEDTIME 08/24/19   Copland, Frederico Hamman, MD  fluticasone (FLONASE) 50 MCG/ACT nasal spray Place 1 spray into both nostrils daily as needed for allergies.    [provider]  guaiFENesin (MUCINEX) 600 MG 12 hr tablet Take 600 mg by mouth daily as needed for cough.    [provider]  Hyprom-Naphaz-Polysorb-Zn Sulf (CLEAR EYES COMPLETE OP) Place 1 drop into both eyes daily as needed (dry eyes).    [provider]  Insulin Pen Needle (PEN NEEDLES) 31G X 5 MM MISC 1 each by Does not apply route 3 (three) times daily. To inject insulin E11.42 09/20/19   Philemon Kingdom, MD  insulin regular human CONCENTRATED (HUMULIN R U-500 KWIKPEN) 500 UNIT/ML kwikpen Inject 20 Units into the skin with breakfast, with lunch, and with evening meal.    [provider]  linagliptin (TRADJENTA) 5 MG TABS tablet Take 1 tablet (5 mg total) by mouth daily. 05/22/20   Pokhrel, Corrie Mckusick, MD  metoprolol tartrate 37.5 MG TABS Take 37.5 mg by mouth 2 (two) times daily. Patient taking differently: Take 75 mg by mouth daily. 05/21/20   Pokhrel, Corrie Mckusick, MD  montelukast (SINGULAIR) 10 MG tablet Take 1 tablet (10 mg total) by mouth at bedtime. 09/20/19   Bedsole, Amy E, MD  pantoprazole  (PROTONIX) 20 MG tablet Take 1 tablet (20 mg total) by mouth daily. 01/21/20   Heilingoetter, Cassandra L, [name]  simvastatin (ZOCOR) 40 MG tablet Take 40 mg by mouth daily.    [provider]  venlafaxine XR (EFFEXOR-XR) 75 MG 24 hr capsule TAKE 1 CAPSULE BY MOUTH EVERY MORNING AND 2 CAPSULES AT BEDTIME Patient taking differently: Take 75-150 mg by mouth in the morning and at bedtime. TAKE 1 CAPSULE BY MOUTH EVERY MORNING AND 2 CAPSULES AT BEDTIME 11/26/19   [name], MD    Physical Exam: Vitals:   05/26/20 0115 05/26/20 0130 05/26/20 0145 05/26/20 0200  BP: (!) 110/50 (!) 116/53 (!) 113/57 (!) 117/56  Pulse: 69 69 70 65  Resp: 18 14 15 16   Temp:      TempSrc:      SpO2: 94% 95% 94% 95%  Weight:      Height:        Constitutional: Patient is lethargic but arousable and oriented x3. Patient is in respiratory distress.  Skin: no rashes, no lesions, poor skin turgor noted. Eyes: Pupils are equally reactive to light.  No evidence of scleral icterus or conjunctival pallor.  ENMT: Slightly dry mucous membranes noted.  Posterior pharynx clear of any exudate or lesions.   Neck: normal, supple, no masses, no thyromegaly.  No evidence of jugular venous distension.   Respiratory: Markedly diminished breath sounds throughout the right lung fields. Diminished breath sounds in the left lower field as well with associated rales. No evidence of wheezing. Significant respiratory distress noted with some evidence of accessory muscle use. Cardiovascular: Regular rate and rhythm, no murmurs / rubs / gallops. No extremity edema. 2+ pedal pulses. No carotid bruits.  Chest:   Nontender without crepitus or deformity.   Back:   Nontender without crepitus or deformity. Abdomen: Abdomen is soft and nontender.  No evidence of intra-abdominal masses.  Positive bowel sounds noted in all quadrants.   Musculoskeletal: No joint deformity upper and lower extremities. Good ROM, no contractures. Normal  muscle tone.  Neurologic: Patient is moving all 4 extremities spontaneously. Patient is following all commands. Patient is lethargic but arousable and oriented x3. Patient is responsive and to verbal and painful stimuli. Sensation is grossly intact. Psychiatric: Patient exhibits normal mood with appropriate affect.  Patient seems to possess insight as to their current situation.     Labs on Admission: I have personally reviewed following labs and imaging studies -   CBC: Recent Labs  Lab 05/19/20 0502 05/20/20 0141 05/21/20 0434 05/28/2020 1939  WBC 8.5 7.9 10.6* 26.4*  NEUTROABS 8.0* 7.4  --  25.0*  HGB 8.9* 9.4* 9.4* 8.2*  HCT 28.5* 30.7* 31.2* 27.8*  MCV 97.3 99.4 101.6* 105.3*  PLT 147* 141* 125* 89*   Basic Metabolic Panel: Recent Labs  Lab 05/19/20 0502 05/20/20 0141 05/21/20 0434 05/18/2020 1939 05/14/2020 2300 05/26/20 0050  NA 139 139 142 131* 136 134*  K 3.2* 3.9 5.0 6.1* 5.7* 5.3*  CL 97* 99 100 95* 98 97*  CO2 31 29 32 15* 19* 19*  GLUCOSE 110* 58* 97 415* 404* 311*  BUN 41* 44* 43* 89* 79* 87*  CREATININE 1.12 1.31* 1.14 3.52* 3.78* 3.51*  CALCIUM 8.5* 8.7* 8.8* 8.2* 8.1* 7.9*  MG 1.6* 1.9 2.1  --   --   --   PHOS  --   --  2.9  --   --   --    GFR: Estimated Creatinine Clearance: 19.7 mL/min (A) (by C-G formula based on SCr of 3.51 mg/dL (H)). Liver Function Tests: Recent Labs  Lab 05/19/20 0502 05/20/20 0141 05/21/20 0434 06/04/2020 1939  AST 24 19 20  10*  ALT 31 30 37 20  ALKPHOS 88 86 96 113  BILITOT 0.6 0.8 0.5 2.2*  PROT 6.2* 6.2* 6.3* 5.8*  ALBUMIN 2.7* 2.8* 2.7* 2.2*   No results for input(s): LIPASE, AMYLASE in the last 168 hours. No results for input(s): AMMONIA in the last 168 hours. Coagulation Profile: Recent Labs  Lab 06/03/2020 2145  INR 3.1*   Cardiac Enzymes: No results for input(s): CKTOTAL, CKMB, CKMBINDEX, TROPONINI in the last 168 hours. BNP (last 3 results) No results for input(s): PROBNP in the last 8760 hours. HbA1C: No  results for input(s): HGBA1C in the last 72 hours. CBG: Recent Labs  Lab 05/21/20 1608 05/16/2020 2145 05/22/2020 2246 05/29/2020 2342 05/26/20 0046  GLUCAP 360* 360* 361* 359* 291*   Lipid Profile: Recent Labs    06/03/2020 1939  TRIG 76   Thyroid Function Tests: No results for input(s): TSH, T4TOTAL, FREET4, T3FREE, THYROIDAB in the last 72 hours. Anemia Panel: Recent Labs    06/01/2020 1939  FERRITIN 2,648*   Urine analysis:    Component Value Date/Time   COLORURINE YELLOW 05/26/2020 0057   APPEARANCEUR HAZY (A) 05/26/2020 0057   LABSPEC 1.013 05/26/2020 0057   PHURINE 5.0 05/26/2020 0057   GLUCOSEU 50 (A) 05/26/2020 0057   HGBUR NEGATIVE 05/26/2020 0057   BILIRUBINUR NEGATIVE 05/26/2020 0057   BILIRUBINUR 1+ 09/10/2014 1615   KETONESUR 5 (A) 05/26/2020 0057   PROTEINUR 30 (A) 05/26/2020 0057   UROBILINOGEN 0.2 12/05/2014 2146   NITRITE NEGATIVE 05/26/2020 0057   LEUKOCYTESUR NEGATIVE 05/26/2020 0057    Radiological Exams on Admission - Personally Reviewed: CT Chest Wo Contrast  Result Date: 06/05/2020 CLINICAL DATA:  Shortness of breath and fatigue. COVID positive 2 weeks ago. Right-sided small cell lung cancer. EXAM: CT CHEST WITHOUT CONTRAST TECHNIQUE: Multidetector CT imaging of the chest was performed following the standard protocol without IV contrast. COMPARISON:  Radiograph earlier today. Chest CTA 11 days ago 05/14/2020 FINDINGS: Cardiovascular: Aortic atherosclerosis. Upper normal heart size with coronary artery calcifications. Tip of the right chest port in the lower SVC. No significant pericardial effusion. Mediastinum/Nodes: Right lower paratracheal node measures 18 mm, series 2, image 58, previously 19 mm. Additional small mediastinal lymph nodes that are not enlarged by size criteria. And upper paratracheal node measures 7 mm, series 2, image 32, unchanged. Limited assessment of hilar structures due to lack of IV contrast. No esophageal wall thickening.  Lungs/Pleura: Large partially loculated right pleural effusion has increased in size  from prior exam. Perihilar consolidation with central air bronchograms again seen, comparison to prior is limited given differences in technique. Cavitary lesion in the right upper lobe with air-fluid level, diminished air component from prior. Borders are less well-defined on the current exam not well assessed. There is mucus or debris within the right mainstem bronchus which completely fills bronchus intermedius. Subsequent near complete right lower and middle lobe collapse. Only small portion of aerated lung is visualized in the upper lobe and anterior right lower lobe. Moderate left pleural effusion, similar to prior. Adjacent airspace disease appears more consolidative and groundglass than previous compressive atelectasis. Subpleural densities in the anterior left upper lobe of diminished from prior, trace residual pleural thickening, series 3, image 68. Previous left upper lobe pulmonary nodules are not definitively seen. There is a new lingular paramediastinal nodule measuring 9 mm, series 3, image 80. Upper Abdomen: No definite acute findings.  Cholecystectomy. Musculoskeletal: Increase in generalized body wall edema from prior exam. Diffuse thoracic spondylosis. There is a right anterolateral fourth rib fracture with question of underlying lucency. IMPRESSION: 1. Large partially loculated right pleural effusion has increased in size from prior exam. 2. Complete filling of right bronchus intermedius and near complete right lower and middle lobe collapse. 3. Right perihilar consolidation has worsened. Cavitary lesion in the right upper lobe with air-fluid level, diminished air component from prior. Borders are less well-defined on the current exam making size comparison difficult. 4. Left lower lobe airspace disease appears more consolidative and ground-glass on the previous compressive atelectasis, and is suspicious for  pneumonia. Moderate left pleural effusion is similar. 5. New 9 mm lingular paramediastinal nodule, indeterminate. The previous peripheral left lung nodules have resolved. 6. Right anterolateral fourth rib fracture with question of underlying lucency, this may be due to subacute fracture or underlying lesion. 7. Increased body wall edema from prior exam. Aortic Atherosclerosis (ICD10-I70.0). Electronically Signed   By: Keith Rake M.D.   On: 05/22/2020 22:55   DG Chest Port 1 View  Result Date: 05/18/2020 CLINICAL DATA:  Short of breath.  Lung cancer. EXAM: PORTABLE CHEST 1 VIEW COMPARISON:  05/15/2020 and older exams. FINDINGS: Near complete opacification of the right hemithorax with finding loss. Portions of the right lung are aerated, which were not aerated on the prior exam. Left lung is hyperexpanded. There is hazy opacity at the left lung base which is improved from the prior study, consistent with a combination of pleural fluid and atelectasis as noted on the CT dated 05/14/2020. Remainder of the left lung is clear. Cardiac silhouette normal in size. No left hilar masses. Normal contour of the left side of the mediastinum. Right hilum and right mediastinum obscured by contiguous lung opacity. No pneumothorax. Right internal jugular power Port-A-Cath is stable. IMPRESSION: 1. No acute findings. 2. Mild improvement when compared to the prior chest radiograph. There is more right lung aerated, although most of the right hemithorax remains opacified. Opacity at the left lung base has improved consistent with a decrease pleural effusions/atelectasis. Electronically Signed   By: Lajean Manes M.D.   On: 06/09/2020 20:01    EKG: Personally reviewed.  Rhythm is ectopic atrial rhythm with heart rate of 62 bpm .  No dynamic ST segment changes appreciated.  Assessment/Plan Principal Problem:   Acute respiratory failure with hypoxia and hypercapnia (HCC)  Patient presenting with progressively worsening  hypoxia  Complicated disease process with recent diagnosis of COVID-19 on 2/2, bilateral pleural effusions particularly on the right, right mainstem  bronchus obstruction and left lower lobe pneumonia  Increasing oxygen requirements throughout the emergency department stay.  ABG performed in the emergency department while on combination of salter high flow and nonrebreather mask reveals a pH of 7.16, PCO2 of 57.1 and PO2 of 86  Placing patient on BiPAP therapy for development of hypercapnic respiratory failure.  Treating patient with broad-spectrum intravenous antibiotics including vancomycin cefepime and Flagyl.  Considering patient's recent diagnosis of COVID, I am uncertain how much Covid is actually contributing to patient's current respiratory illness.  Patient has already completed a 5-day course of remdesivir.   Once DKA has resolved will consider initiation of systemic steroids once again.  We will additionally reevaluate use of Actemra  Possible need for thoracentesis discussed with Dr. Prudencio Burly with CCM who felt that therapeutic thoracentesis would not improve respiratory status in this patient.  He also felt that the patient would likely not benefit from being on CCM service and agreed with our decision to initiate BiPAP while staying on the hospitalist service in the stepdown unit.  Dr. Prudencio Burly will communicate with the ground team for a formal bedside evaluation of the patient - pccm recommendations are appreciated.  Active Problems: COVID-19 virus infection   Diagnosed on 2/2  Since patient is continuing to experience substantial respiratory disease we will continue airborne and contact isolation until day 21  Patient has completed 5-day course of remdesivir.  Please see remainder of assessment and plan above.  Acute renal failure superimposed on stage 3a chronic kidney disease ([place])   Substantial acute kidney injury  Presumably prerenal in the setting of poor oral intake  due to acute illness.  Hydrating patient with intravenous isotonic fluids  Strict input and output  Monitoring renal function and electrolytes with serial chemistries  Avoiding nephrotoxic agents if at all possible    Pleural effusion, bilateral   Extensive pleural effusion on the right with smaller pleural effusion on the left.  Possible thoracentesis discussed with Dr. Prudencio Burly with CCM.  He does not feel that an urgent thoracentesis will necessarily improve patient's respiratory status since the feels the effusion (mainly on the right) is not acute and is accompanied by a right mainstem obstruction .    CCM coming to formally evaluate further - appreciate their input/recs.   We will continue to monitor closely    Diabetic ketoacidosis without coma associated with type 2 diabetes mellitus ([place])   Patient exhibiting evidence of ketoacidosis with anion gap and elevated beta hydroxybutyrate  Underlying infection has likely precipitated ketoacidosis  Hydrating patient with intravenous isotonic fluids  Patient currently n.p.o.  Insulin infusion initiated  Performing serial chemistries and beta hydroxybutyrate levels  We will transition patient over to basal bolus insulin therapy once gap is closed  Elevated D-dimer   Markedly elevated D-dimer on this presentation of 11.76, a dramatic increase from 2.45 at time of discharge 5 days prior  Patient is on chronic Eliquis therapy for paroxysmal atrial fibrillation making acute thromboembolism extremely unlikely  That being said, considering patient's severity of illness and dramatic increase in O2 requirement the emergency department provider initiated a heparin infusion due to concern for PE.  Unfortunately we are unable to rule out a pulmonary embolism with CTA chest in this patient due to advanced renal injury.  VQ additionally would not prove useful.    We will leave heparin infusion going for now, based on clinical course  will obtain CTA of the chest once kidneys improve    Hyperkalemia  No associated EKG changes  Patient initially presented with potassium of 6.1.  Patient is receiving insulin and bicarbonate as well as intravenous fluids.  Potassium levels are slowly downtrending with serial chemistries.  Monitoring patient on telemetry    Stenosis of right bronchus   Etiology unclear.  Possibly secondary to aspiration event or secondary to malignancy.  Patient too unstable to obtain bronchoscopy at this time.  Anaerobic coverage added to intravenous antibiotic regimen  PCCM to come by and evaluate patient and provide recommendations, their input is appreciated.    Thrombocytopenia (HCC)   Thrombocytopenia seems to have developed in approximately the last several days  Most likely etiology is that thrombocytopenia is secondary to acute infection  ER provider did discuss case with on-call hematology due to desire to start heparin due to markedly elevated DDimer on presentation.  It is felt that this is likely not HIT. Hematology advised to monitor platelets closely after initiation of heparin and DC if downtrending.      Essential hypertension   Holding home regimen of oral antihypertensives  As needed intravenous intravenous antihypertensives for markedly elevated blood pressures.    AF (paroxysmal atrial fibrillation) (HCC)   Holding home regimen of oral metoprolol and diltiazem  As needed intravenous metoprolol for rate control and if this is inadequate we will additionally place patient on diltiazem infusion.  Patient has been on Eliquis for anticoagulation but due to markedly elevated D-dimer patient is now on heparin infusion    Small cell lung cancer [facility])  Chemotherapy temporarily held since diagnosis of COVID  Outpatient follow-up with Dr. [name]     Code Status:  DNR Family Communication: patient declines any additional calls to famly   Status is:  Inpatient  Remains inpatient appropriate because:IV treatments appropriate due to intensity of illness or inability to take PO and Inpatient level of care appropriate due to severity of illness   Dispo: The patient is from: Home              Anticipated d/c is to: Home              Anticipated d/c date is: > 3 days              Patient currently is not medically stable to d/c.   Difficult to place patient No        Vernelle Emerald MD [facility] Pager [phone]  If 7PM-7AM, please contact night-coverage [email or web address] Use universal [place] password for that web site. If you do not have the password, please call the hospital operator.  05/26/2020, 2:12 AM

## 2020-05-26 NOTE — Progress Notes (Signed)
Conversation with [name] (son) and his wife to discuss goals of care.  We discussed [name] (patient) presentation with respiratory failure in the setting of sepsis, recent covid infection, concern for aspiration and superimposed bacterial pneumonia.  Discussed his acute kidney injury and hypothermia as well.  Discussed our current interventions.  He's critically ill with poor prognosis.  [name] (patient) confirmed with me his desire not to be on [name] ventilator this morning.  His son and daughter in law note agreement with that.  We discussed his presentation, they note that he was doing well in the hospital when he was being treated for covid, but he's progressively declined since being home.  He's had weakness that has progressed daily.  His PO intake was poor.  He developed shortness of breath over the past several days and his daughter in law notes that she had to make him come to the hospital last night.    [name] son and his daughter in law understand he's critically ill.  They believe that [name] (patient) would want to focus on his comfort.  We discussed logistics of having family visit.  Unfortunately I think the likelihood that he'll survive until his daughter is able to come from [place] is low, they understood and believe she'll understand as well.  We discussed in the room with [name] (patient) and discussed our plan for comfort measures - difficult to understand/communicate with him due to his encephalopathy, respiratory distress, and on bipap, but seems to indicate some understanding from our conversation (though difficult to tell).  [name] (son) is the primary decision maker (and his wife is [name] Psychologist, sport and exercise and helps the family understand these medical decisions/issues).  We decided on [name] plan for comfort measures, family (son and daughter in law are in agreement - I asked them to speak to the other children as well, they indicated that they'd already  begun these discussions with family and it seems like there's agreement with this plan).  Will transition [name] to comfort measures.  Will leave bipap in place for now and give local family an opportunity to visit.  Will try to give family who aren't local an opportunity to communicate via video messaging/face time.  Will discontinue bipap after family has had opportunity to see him.  [name] (son) and his wife are agreeable to this plan, I asked them to let me know if they have any additional questions or concerns or if anyone else would like to speak with me.

## 2020-05-26 NOTE — Progress Notes (Addendum)
PROGRESS NOTE    [name]  FIE:[id number] DOB: 08-17-1945 DOA: 05/23/2020 PCP: [name], MD  Chief Complaint  Patient presents with  . Shortness of Breath   Brief Narrative:  75 year old male with past medical history of limited stage small cell lung cancer, paroxysmal atrial fibrillation, hyperlipidemia, diabetes mellitus type 2, chronic kidney disease stage IIIa, anemia of chronic disease and recent hospitalization at [facility] for COVID-19 who presents to [facility] emergency department with progressively worsening generalized weakness shortness of breath cough and appetite.  He's been admitted with acute hypoxic respiratory failure 2/2 possible aspiration +/- bacterial pneumonia and DKA.  Assessment & Plan:   Principal Problem:   Acute respiratory failure with hypoxia (HCC) Active Problems:   Essential hypertension   Small cell lung cancer ([place])   Acute renal failure superimposed on stage 3a chronic kidney disease (HCC)   AF (paroxysmal atrial fibrillation) (HCC)   Pleural effusion, bilateral   Diabetic ketoacidosis without coma associated with type 2 diabetes mellitus (HCC)   Hyperkalemia   Stenosis of right bronchus   Thrombocytopenia (HCC)   Elevated d-dimer   Pressure injury of skin  Goals of Care: pt is DNR.  Confirmed that he would not want mechanical ventilation this AM, his son reiterates this.  He's critically ill with notably increased WOB this AM.  Prognosis overall seems poor.  Will follow pulm recs and discuss with palliative.   Acute Hypoxic and Hypercarbic Respiratory Failure in the setting of Recent COVID 19 Infection  Concern for Aspiration Pneumonia  Concern for Superimposed Bacterial Pneumonia Currently on 15 L HFNC and NRB, increased wob on exam, paradoxical  CT 2/13 with large partially loculated R effusion complete filling of right bronchus intermedius and near complete right lower and middle lobe collapse.  Right  perihilar consolidation.  Cavitary lesion in right upper lobe with air fluid level.  LLL airspace disease concerning for pneumonia.  Moderate L pleural effusion.   Continue vanc/cefepime/flagyl.  Follow sputum cx, MRSA pcr.  Narrow as able. Hold off on steroids as suspect this is due to aspiration vs bacterial pneumonia rather than COVID 19.  Completed remdesivir and steroids at last visit. Follow repeat VBG this AM Chest PT for concern for aspiration Appreciate PCCM consult -> ? thora S/p Bipap overnight.  Continue as needed. Procal 11.  Follow blood cultures.  Sputum cultures.  Urine cultures. Strict I/O, daily weights.   Addendum: sepsis due to pneumonia, ruled in   COVID-19 Labs  Recent Labs    06/07/2020 1939  DDIMER 11.76*  FERRITIN 2,648*  LDH 129  CRP 31.6*    Lab Results  Component Value Date   SARSCOV2NAA POSITIVE (Tira Lafferty) 05/14/2020   SARSCOV2NAA Not Detected 05/13/2020   [place] NEGATIVE 03/24/2020   [place] NEGATIVE 03/11/2020   DKA BG's improved, AG resolved He's not safe for PO at this time from respiratory standpoint consider transition to subcutaneous insulin (at home on U500 insulin 20 units TID with meals)  Acute Kidney Injury  Hyperkalemia Baseline creatinine appears to be 1.1-1.3 3.78 on presentation Likely 2/2 hypovolemia/sepsis in setting of above and DKA Renal US without hydro UA with 30 mg/dl protein, 0-5 RBC  Bilateral Pleural Effusions abx as above PCCM consult, appreciate assistance  Elevated D Dimer: Currently on heparin gtt (on eliquis for afib at home) Follow LE [place] Consider chest imaging when able based on renal function  Thrombocytopenia Likely 2/2 sepsis, ED provider discussed with heme who did not think  c/w HIT Follow and w/u as indicated  Hypertension bp soft, continue to monitor off meds  Atrial fibrillation Continue holding AV notal blockers Has prn metop as needed Heparin gtt  Limited Stage Small Cell Lung  Cancer Follows outpatient with Dr. [name]   DVT prophylaxis: heparin Code Status: DNR Family Communication: son over phone Disposition:   Status is: Inpatient  Remains inpatient appropriate because:Inpatient level of care appropriate due to severity of illness   Dispo: The patient is from: Home              Anticipated d/c is to: pending              Anticipated d/c date is: > 3 days              Patient currently is not medically stable to d/c.   Difficult to place patient No       Consultants:   PCCM   Procedures:   none  Antimicrobials: Anti-infectives (From admission, onward)   Start     Dose/Rate Route Frequency Ordered Stop   06-12-20 2200  vancomycin (VANCOCIN) IVPB 1000 mg/200 mL premix        1,000 mg 200 mL/hr over 60 Minutes Intravenous Every 48 hours 05/26/20 0048     05/26/20 2200  ceFEPIme (MAXIPIME) 2 g in sodium chloride 0.9 % 100 mL IVPB        2 g 200 mL/hr over 30 Minutes Intravenous Every 24 hours 05/28/2020 2130     05/26/20 0000  metroNIDAZOLE (FLAGYL) IVPB 500 mg        500 mg 100 mL/hr over 60 Minutes Intravenous Every 8 hours 06/02/2020 2353     06/06/2020 2115  ceFEPIme (MAXIPIME) 2 g in sodium chloride 0.9 % 100 mL IVPB        2 g 200 mL/hr over 30 Minutes Intravenous  Once 05/31/2020 2114 05/24/2020 2217   05/13/2020 2115  vancomycin (VANCOCIN) IVPB 1000 mg/200 mL premix        1,000 mg 200 mL/hr over 60 Minutes Intravenous  Once 06/02/2020 2114 05/19/2020 2259     Subjective: Difficult to discuss due to significant SOB He confirmed he would not want mechanical ventilation   Objective: Vitals:   05/26/20 0600 05/26/20 0700 05/26/20 0800 05/26/20 0900  BP: (!) 123/40 (!) 113/50 (!) 104/50 (!) 97/54  Pulse: 67 (!) 59 61 (!) 59  Resp: 17 (!) 22 20 16   Temp:   (!) 97.2 F (36.2 C)   TempSrc:   Axillary   SpO2: 90% 92% 94% 96%  Weight:      Height:        Intake/Output Summary (Last 24 hours) at 05/26/2020 0940 Last data filed at 05/26/2020  0859 Gross per 24 hour  Intake 2492.76 ml  Output --  Net 2492.76 ml   Filed Weights   05/16/2020 1931 05/26/20 0516  Weight: 86.2 kg 95.9 kg    Examination:  General exam: Appears calm and comfortable  Respiratory system: diffuse rhonchi, speaks in short 2-3 word phrases, paradoxical breathing Cardiovascular system: S1 & S2 heard, RRR Gastrointestinal system: Abdomen is nondistended, soft and nontender Central nervous system: Alert and oriented. No focal neurological deficits. Extremities: trace edema Psychiatry: Judgement and insight appear normal. Mood & affect appropriate.     Data Reviewed: I have personally reviewed following labs and imaging studies  CBC: Recent Labs  Lab 05/20/20 0141 05/21/20 0434 05/22/2020 1939 05/26/20 0420  WBC 7.9 10.6* 26.4* 19.1*  NEUTROABS 7.4  --  25.0*  --   HGB 9.4* 9.4* 8.2* 8.2*  HCT 30.7* 31.2* 27.8* 28.0*  MCV 99.4 101.6* 105.3* 103.7*  PLT 141* 125* 89* 79*    Basic Metabolic Panel: Recent Labs  Lab 05/20/20 0141 05/21/20 0434 05/23/2020 1939 05/24/2020 2300 05/26/20 0050 05/26/20 0420 05/26/20 0804  NA 139 142 131* 136 134* 134* 135  K 3.9 5.0 6.1* 5.7* 5.3* 5.2* 5.1  CL 99 100 95* 98 97* 97* 98  CO2 29 32 15* 19* 19* 23 25  GLUCOSE 58* 97 415* 404* 311* 281* 208*  BUN 44* 43* 89* 79* 87* 86* 90*  CREATININE 1.31* 1.14 3.52* 3.78* 3.51* 3.53* 3.42*  CALCIUM 8.7* 8.8* 8.2* 8.1* 7.9* 7.9* 7.8*  MG 1.9 2.1  --   --   --   --   --   PHOS  --  2.9  --   --   --   --   --     GFR: Estimated Creatinine Clearance: 22.4 mL/min (Dylin Ihnen) (by C-G formula based on SCr of 3.42 mg/dL (H)).  Liver Function Tests: Recent Labs  Lab 05/20/20 0141 05/21/20 0434 05/30/2020 1939  AST 19 20 10*  ALT 30 37 20  ALKPHOS 86 96 113  BILITOT 0.8 0.5 2.2*  PROT 6.2* 6.3* 5.8*  ALBUMIN 2.8* 2.7* 2.2*    CBG: Recent Labs  Lab 05/26/20 0251 05/26/20 0410 05/26/20 0511 05/26/20 0628 05/26/20 0744  GLUCAP 222* 186* 138* 137* 137*      Recent Results (from the past 240 hour(s))  Culture, sputum-assessment     Status: None   Collection Time: 05/26/20 12:22 AM   Specimen: Expectorated Sputum  Result Value Ref Range Status   Specimen Description EXPECTORATED SPUTUM  Final   Special Requests NONE  Final   Sputum evaluation   Final    THIS SPECIMEN IS ACCEPTABLE FOR SPUTUM CULTURE Performed at [facility], [place] [address], [place], [place] [address]    Report Status 05/26/2020 FINAL  Final  MRSA PCR Screening     Status: None   Collection Time: 05/26/20  4:57 AM   Specimen: Nasopharyngeal  Result Value Ref Range Status   MRSA by PCR NEGATIVE NEGATIVE Final    Comment:        The GeneXpert MRSA Assay (FDA approved for NASAL specimens only), is one component of Jaquavius Hudler comprehensive MRSA colonization surveillance program. It is not intended to diagnose MRSA infection nor to guide or monitor treatment for MRSA infections. Performed at [facility], [place] [address], [place], [place] [address]          Radiology Studies: CT Chest Wo Contrast  Result Date: 06/08/2020 CLINICAL DATA:  Shortness of breath and fatigue. COVID positive 2 weeks ago. Right-sided small cell lung cancer. EXAM: CT CHEST WITHOUT CONTRAST TECHNIQUE: Multidetector CT imaging of the chest was performed following the standard protocol without IV contrast. COMPARISON:  Radiograph earlier today. Chest CTA 11 days ago 05/14/2020 FINDINGS: Cardiovascular: Aortic atherosclerosis. Upper normal heart size with coronary artery calcifications. Tip of the right chest port in the lower SVC. No significant pericardial effusion. Mediastinum/Nodes: Right lower paratracheal node measures 18 mm, series 2, image 58, previously 19 mm. Additional small mediastinal lymph nodes that are not enlarged by size criteria. And upper paratracheal node measures 7 mm, series 2, image 32, unchanged. Limited assessment of hilar structures due to  lack of IV contrast. No esophageal wall thickening. Lungs/Pleura: Large  partially loculated right pleural effusion has increased in size from prior exam. Perihilar consolidation with central air bronchograms again seen, comparison to prior is limited given differences in technique. Cavitary lesion in the right upper lobe with air-fluid level, diminished air component from prior. Borders are less well-defined on the current exam not well assessed. There is mucus or debris within the right mainstem bronchus which completely fills bronchus intermedius. Subsequent near complete right lower and middle lobe collapse. Only small portion of aerated lung is visualized in the upper lobe and anterior right lower lobe. Moderate left pleural effusion, similar to prior. Adjacent airspace disease appears more consolidative and groundglass than previous compressive atelectasis. Subpleural densities in the anterior left upper lobe of diminished from prior, trace residual pleural thickening, series 3, image 68. Previous left upper lobe pulmonary nodules are not definitively seen. There is Tameyah Koch new lingular paramediastinal nodule measuring 9 mm, series 3, image 80. Upper Abdomen: No definite acute findings.  Cholecystectomy. Musculoskeletal: Increase in generalized body wall edema from prior exam. Diffuse thoracic spondylosis. There is Jovanna Hodges right anterolateral fourth rib fracture with question of underlying lucency. IMPRESSION: 1. Large partially loculated right pleural effusion has increased in size from prior exam. 2. Complete filling of right bronchus intermedius and near complete right lower and middle lobe collapse. 3. Right perihilar consolidation has worsened. Cavitary lesion in the right upper lobe with air-fluid level, diminished air component from prior. Borders are less well-defined on the current exam making size comparison difficult. 4. Left lower lobe airspace disease appears more consolidative and ground-glass on the previous  compressive atelectasis, and is suspicious for pneumonia. Moderate left pleural effusion is similar. 5. New 9 mm lingular paramediastinal nodule, indeterminate. The previous peripheral left lung nodules have resolved. 6. Right anterolateral fourth rib fracture with question of underlying lucency, this may be due to subacute fracture or underlying lesion. 7. Increased body wall edema from prior exam. Aortic Atherosclerosis (ICD10-I70.0). Electronically Signed   By: Keith Rake M.D.   On: 06/02/2020 22:55   US RENAL  Result Date: 05/26/2020 CLINICAL DATA:  Acute kidney injury EXAM: RENAL / URINARY TRACT ULTRASOUND COMPLETE COMPARISON:  CT abdomen pelvis April 06, 2019 FINDINGS: Right Kidney: Renal measurements: 10.4 x 6.8 x 5.5 cm = volume: 202 mL. Echogenicity within normal limits. Lower pole renal cyst measuring 3 cm. No solid mass or hydronephrosis visualized. Left Kidney: Renal measurements: 10.7 x 6.2 x 5.6 cm = volume: 196 mL. Echogenicity within normal limits. No mass or hydronephrosis visualized. Bladder: Appears normal for degree of bladder distention. Other: Trace perihepatic free fluid. IMPRESSION: No hydronephrosis. Electronically Signed   By: Dahlia Bailiff MD   On: 05/26/2020 08:52   DG Chest Port 1 View  Result Date: 05/24/2020 CLINICAL DATA:  Short of breath.  Lung cancer. EXAM: PORTABLE CHEST 1 VIEW COMPARISON:  05/15/2020 and older exams. FINDINGS: Near complete opacification of the right hemithorax with finding loss. Portions of the right lung are aerated, which were not aerated on the prior exam. Left lung is hyperexpanded. There is hazy opacity at the left lung base which is improved from the prior study, consistent with Amun Stemm combination of pleural fluid and atelectasis as noted on the CT dated 05/14/2020. Remainder of the left lung is clear. Cardiac silhouette normal in size. No left hilar masses. Normal contour of the left side of the mediastinum. Right hilum and right mediastinum  obscured by contiguous lung opacity. No pneumothorax. Right internal jugular power Port-Lashina Milles-Cath is stable. IMPRESSION:  1. No acute findings. 2. Mild improvement when compared to the prior chest radiograph. There is more right lung aerated, although most of the right hemithorax remains opacified. Opacity at the left lung base has improved consistent with Lucette Kratz decrease pleural effusions/atelectasis. Electronically Signed   By: Lajean Manes M.D.   On: 06/01/2020 20:01        Scheduled Meds: . albuterol  2 puff Inhalation Q6H  . Chlorhexidine Gluconate Cloth  6 each Topical Q0600  . feeding supplement  237 mL Oral BID BM  . mouth rinse  15 mL Mouth Rinse BID   Continuous Infusions: . sodium chloride 10 mL/hr at 05/26/20 0859  . ceFEPime (MAXIPIME) IV    . dextrose 5% lactated ringers 100 mL/hr at 05/26/20 0859  . heparin 1,300 Units/hr (05/26/20 0859)  . insulin 0.5 mL/hr at 05/26/20 0859  . lactated ringers Stopped (05/26/20 0311)  . metronidazole Stopped (05/26/20 0854)  . [START ON 06-12-20] vancomycin       LOS: 1 day    Time spent: 45 min critical care time    Fayrene Helper, MD [facility]   To contact the attending provider between 7A-7P or the covering provider during after hours 7P-7A, please log into the web site [email or web address] and access using universal [place] password for that web site. If you do not have the password, please call the hospital operator.  05/26/2020, 9:40 AM

## 2020-05-26 NOTE — Progress Notes (Signed)
CRITICAL VALUE ALERT  Critical Value:  PH 7.1 PCO2 74  Date & Time Notied:  05/26/20 1000  Provider Notified: Dr. Florene Glen  Orders Received/Actions taken: Pt to be placed on bipap

## 2020-05-26 NOTE — Progress Notes (Signed)
Pharmacy Antibiotic Note  LINKOLN ALKIRE is a 75 y.o. male admitted on 05/30/2020 with a chief complaint of shortness of breath.  He has history of CKD, hypertension, hyperlipidemia, lung cancer. Pt recently discharged from hospital for covid treatment  Pharmacy has been consulted to dose vancomycin and cefepime for pna. CrCl ~ 61mls/min  Plan: Vancomycin 1gm IV x 1 then 1gm q48h Cefepime 2gm IV q24h Follow renal function for adjustments   Height: 5\' 11"  (180.3 cm) Weight: 86.2 kg (190 lb) IBW/kg (Calculated) : 75.3  Temp (24hrs), Avg:96.8 F (36 C), Min:96.8 F (36 C), Max:96.8 F (36 C)  Recent Labs  Lab 05/19/20 0502 05/20/20 0141 05/21/20 0434 05/13/2020 1939 05/29/2020 2300  WBC 8.5 7.9 10.6* 26.4*  --   CREATININE 1.12 1.31* 1.14 3.52* 3.78*  LATICACIDVEN  --   --   --  1.7  --     Estimated Creatinine Clearance: 18.3 mL/min (A) (by C-G formula based on SCr of 3.78 mg/dL (H)).    Allergies  Allergen Reactions  . Tetracycline Itching and Rash    Antimicrobials this admission: 2/13 cefepime >> 2/13 vanc >> 2/14 flagyl >> Dose adjustments this admission:   Microbiology results: 2/13 BCx:  2/13  UCx:  2/13 Sputum:     Thank you for allowing pharmacy to be a part of this patient's care.  Dolly Rias RPh 05/26/2020, 12:47 AM

## 2020-05-26 NOTE — Progress Notes (Signed)
Pt. placed on BiPAP V-60 per order/ABG results from 05/26/2020 at 0010, tolerating well, RN aware.

## 2020-05-26 NOTE — Telephone Encounter (Signed)
[place] Night - Client Nonclinical Telephone Record  AccessNurse Client [place] Night - Client Client Site [place] - Night Physician [place], [place] Type Call Call Brownell Page Now Who Is [place] / [place] Name [place] [facility] Name [place] Number [id number] Patient Name [name] Patient DOB [birth year] Reason for Call Request to speak to Physician Initial Comment Caller states she needs to verify a Prednisone order. She is calling from home health. Disp. Time Disposition Final User 05/24/2020 2:17:43 PM Send to [place] Rica Mote 05/24/2020 2:26:34 PM Paged On Call back to Call [place], [place] 05/24/2020 2:27:00 PM Page Completed Yes Juline Patch Paging DoctorName Phone DateTime Result/Outcome Message Type Notes Nani Ravens MD [phone] 05/24/2020 2:26:33 PM Called On Call Provider - Left Message Doctor Paged Nani Ravens, - MD 05/24/2020 2:27:14 PM Spoke with On Call - General Message Result Call Closed By: Juline Patch Transaction Date/Time: 05/24/2020 2:14:51 PM (ET)

## 2020-05-26 NOTE — Progress Notes (Signed)
BiPAP V-60 placed @ bedside.

## 2020-05-26 NOTE — Progress Notes (Signed)
Inpatient Diabetes Program Recommendations  AACE/ADA: New Consensus Statement on Inpatient Glycemic Control (2015)  Target Ranges:  Prepandial:   less than 140 mg/dL      Peak postprandial:   less than 180 mg/dL (1-2 hours)      Critically ill patients:  140 - 180 mg/dL   Lab Results  Component Value Date   GLUCAP 100 (H) 05/26/2020   HGBA1C 8.8 (H) 05/15/2020    Review of Glycemic Control Results for [name], [name] ([name] [id number]) as of 05/26/2020 10:12  Ref. Range 05/26/2020 02:51 05/26/2020 04:10 05/26/2020 05:11 05/26/2020 06:28 05/26/2020 07:44 05/26/2020 08:48  Glucose-Capillary Latest Ref Range: 70 - 99 mg/dL 222 (H) 186 (H) 138 (H) 137 (H) 137 (H) 100 (H)   Diabetes history: DM 2 Outpatient Diabetes medications:  U500 20 units tid with meals Current orders for Inpatient glycemic control:  IV insulin Inpatient Diabetes Program Recommendations:   If patient is to be transitioned off insulin drip today, consider Levemir 25 units bid and Novolog resistant q 4 hours.    Thanks,  Adah Perl, RN, BC-ADM Inpatient Diabetes Coordinator Pager [phone] (8a-5p)

## 2020-05-26 NOTE — Progress Notes (Signed)
SLP Cancellation Note  Patient Details Name: [name] [name]: [id number] DOB: 05/03/1945   Cancelled treatment:       Reason Eval/Treat Not Completed: Medical issues which prohibited therapy;Other (comment) (patient to be placed on BIPAP, MD states in progress note that patient not ready for PO's from respiratory standpoint)   Sonia Baller, MA, CCC-SLP Speech Therapy

## 2020-05-26 NOTE — Progress Notes (Signed)
[place] Progress Note Patient Name: JATERRIUS RICKETSON DOB: 10-18-1945 [name]: [id number]   Date of Service  05/26/2020  HPI/Events of Note  [facility] from [facility] ED called for input and if possible our evaluation for thoracentesis overnight?.   89 male SCLC recent chemo, was in hosp from 2 nnd to 63 th for Covid Pneumonia on home o2. now in DKA, Acute on CKD, AHRF. CxR chronic volume loss rt lung, effusion-loculated. rt main stem progressive obstruction on CT chest. advised him that thoracentesis will not improve his sob. treat DKA. BiPAP and vanc/zosyn/voriconazole. DNR/DNI. Consider ID help also.     eICU Interventions  Notified bed side CCM team also.      Intervention Category Major Interventions: Other: Intermediate Interventions: Communication with other healthcare providers and/or family  Elmer Sow 05/26/2020, 1:05 AM

## 2020-05-27 ENCOUNTER — Ambulatory Visit: Payer: Medicare Other

## 2020-05-27 ENCOUNTER — Encounter: Payer: Self-pay | Admitting: Radiation Oncology

## 2020-05-27 ENCOUNTER — Telehealth: Payer: Medicare Other

## 2020-05-27 LAB — URINE CULTURE

## 2020-05-28 ENCOUNTER — Ambulatory Visit: Payer: Medicare Other | Admitting: Family Medicine

## 2020-05-28 ENCOUNTER — Ambulatory Visit: Payer: Medicare Other

## 2020-05-28 LAB — CULTURE, RESPIRATORY W GRAM STAIN

## 2020-05-28 LAB — LEGIONELLA PNEUMOPHILA SEROGP 1 UR AG: L. pneumophila Serogp 1 Ur Ag: NEGATIVE

## 2020-05-29 ENCOUNTER — Ambulatory Visit: Payer: Medicare Other | Admitting: Radiation Oncology

## 2020-05-29 LAB — SEROTONIN RELEASE ASSAY (SRA)
SRA .2 IU/mL UFH Ser-aCnc: 4 % (ref 0–20)
SRA 100IU/mL UFH Ser-aCnc: 2 % (ref 0–20)

## 2020-05-30 ENCOUNTER — Ambulatory Visit: Payer: Medicare Other

## 2020-05-31 LAB — CULTURE, BLOOD (ROUTINE X 2)
Culture: NO GROWTH
Culture: NO GROWTH
Special Requests: ADEQUATE
Special Requests: ADEQUATE

## 2020-06-02 ENCOUNTER — Ambulatory Visit: Payer: Medicare Other

## 2020-06-03 ENCOUNTER — Ambulatory Visit: Payer: Medicare Other

## 2020-06-04 ENCOUNTER — Ambulatory Visit: Payer: Medicare Other

## 2020-06-05 ENCOUNTER — Ambulatory Visit: Payer: Medicare Other

## 2020-06-06 ENCOUNTER — Telehealth: Payer: Self-pay

## 2020-06-06 ENCOUNTER — Ambulatory Visit: Payer: Medicare Other

## 2020-06-06 NOTE — Telephone Encounter (Signed)
Received call from [facility] before patient had passed they had went to home to set up services. Because they they had the meeting with him so they will still need the plan of care signed and sent back. They will fax that to our office today.

## 2020-06-09 ENCOUNTER — Ambulatory Visit: Payer: Medicare Other

## 2020-06-10 ENCOUNTER — Ambulatory Visit: Payer: Medicare Other

## 2020-06-10 NOTE — Progress Notes (Signed)
  Radiation Oncology         ([phone]) [phone] ________________________________  Name: [name] [name]: [id number]  Date: 06-22-2020  DOB: [birth year]  End of Treatment Note  Diagnosis:  Limited Stage Small Cell Carcinoma of the right lower lobe/ right hilum.    Indication for treatment: palliative       Radiation treatment dates:  The patient passed away prior to starting  Site/planned dose:   PCI 25 Gy whole brain radiation in 10 fxns  Narrative: The patient's status declined prior to his course of radiation, and he did not receive any brain radiation. He passed away on 06-22-2020 from acute hypoxic and hypercarbic respiratory failure complicated by pneumonia in the setting of recent Covid infection.  Plan: The patient passed away prior to beginning radiation. ________________________________     Carola Rhine, PAC

## 2020-06-10 NOTE — Death Summary Note (Addendum)
DEATH SUMMARY   Patient Details  Name: [name] [name]: [id number] DOB: 08/19/1945  Admission/Discharge Information   Admit Date:  06/12/20  Date of Death: Date of Death: 06/14/20  Time of Death: Time of Death: 0408  Length of Stay: 2  Referring Physician: [name], MD   Reason(s) for Hospitalization  acute hypoxic and hypercarbic respiratory failure likely due to possible aspiration pneumonia or bacterial pneumonia in the setting of [name] recent covid infection with history of small cell lung cancer  Diagnoses  Preliminary cause of death:  Secondary Diagnoses (including complications and co-morbidities):  Principal Problem:   Acute respiratory failure with hypoxia ([place]) Active Problems:   Essential hypertension   Small cell lung cancer ([place])   Goals of care, counseling/discussion   Acute renal failure superimposed on stage 3a chronic kidney disease ([place])   AF (paroxysmal atrial fibrillation) (HCC)   Pleural effusion, bilateral   Diabetic ketoacidosis without coma associated with type 2 diabetes mellitus ([place])   Hyperkalemia   Stenosis of right bronchus   Thrombocytopenia (HCC)   Elevated d-dimer   Pressure injury of skin   Brief [name] Course (including significant findings, care, treatment, and services provided and events leading to death)  [name] is [name] 75 y.o. year old male who has [name] history of limited stage small cell lung cancer, paroxysmal atrial fibrillation, hyperlipidemia, diabetes mellitus type 2, chronic kidney disease stage IIIa, anemia of chronic disease and recent hospitalization at [facility] for COVID-19 who [name] department with progressively worsening generalized weakness shortness of breath cough and appetite.  He's been admitted with acute hypoxic respiratory failure 2/2 possible aspiration as well as bacterial pneumonia and DKA as well as acute kidney injury in the setting of recent  covid infection.  Sepsis was ruled in.  He had progressive respiratory failure despite bipap and antibiotics.  He was DNR.  Discussion was made with family given his worsening respiratory status and decision was made for comfort measures.  Family was given opportunity to visit.  He was placed on morphine gtt for comfort and bipap was removed.  He passed away at [name] on 14-Jun-2022.    See previous notes and assessment and plan for additional details  Pertinent Labs and Studies  Significant Diagnostic Studies DG Chest 2 View  Result Date: 05/14/2020 CLINICAL DATA:  Worsening shortness of breath for 4 days with productive cough. Active treatment for lung cancer. EXAM: CHEST - 2 VIEW COMPARISON:  Chest radiographs yesterday.  Most recent CT 03/24/2020 FINDINGS: Right chest port remains in place. Moderate to large partially loculated right pleural effusion is not significantly changed from yesterday. Opacities throughout the right hemithorax, confluent in the perihilar region, also unchanged. Cavitary component on prior CT is not well seen by radiograph. Stable volume loss in the right hemithorax. Small left pleural effusion is similar. Background chronic bronchial thickening. Minor atelectasis at the left lung base. No pneumothorax. Stable osseous structures. IMPRESSION: 1. No significant change from yesterday. 2. Unchanged partially loculated moderate to large right pleural effusion. Diffuse right lung opacities, confluent in the perihilar region, patient with known lung cancer. Cavitary component on prior CT is not well demonstrated. 3. Small left pleural effusion is similar. 4. Chronic bronchial thickening. Electronically Signed   By: [name] M.D.   On: 05/14/2020 14:59   DG Chest 2 View  Result Date: 05/14/2020 CLINICAL DATA:  Cough, shortness of breath EXAM: CHEST - 2 VIEW COMPARISON:  03/26/2020 FINDINGS: Right Port-Mckensie Scotti-Cath remains in place, unchanged. Right pleural effusion and airspace disease  throughout the right lung, similar to prior study. Minimal left base opacity, likely atelectasis. Small left pleural effusion. Heart is normal size. IMPRESSION: Moderate to large right pleural effusion with diffuse right lung airspace disease, unchanged. Small left effusion with left base atelectasis. Electronically Signed   By: Rolm Baptise M.D.   On: 05/14/2020 11:06   CT Chest Wo Contrast  Result Date: 05/14/2020 CLINICAL DATA:  Shortness of breath and fatigue. COVID positive 2 weeks ago. Right-sided small cell lung cancer. EXAM: CT CHEST WITHOUT CONTRAST TECHNIQUE: Multidetector CT imaging of the chest was performed following the standard protocol without IV contrast. COMPARISON:  Radiograph earlier today. Chest CTA 11 days ago 05/14/2020 FINDINGS: Cardiovascular: Aortic atherosclerosis. Upper normal heart size with coronary artery calcifications. Tip of the right chest port in the lower SVC. No significant pericardial effusion. Mediastinum/Nodes: Right lower paratracheal node measures 18 mm, series 2, image 58, previously 19 mm. Additional small mediastinal lymph nodes that are not enlarged by size criteria. And upper paratracheal node measures 7 mm, series 2, image 32, unchanged. Limited assessment of hilar structures due to lack of IV contrast. No esophageal wall thickening. Lungs/Pleura: Large partially loculated right pleural effusion has increased in size from prior exam. Perihilar consolidation with central air bronchograms again seen, comparison to prior is limited given differences in technique. Cavitary lesion in the right upper lobe with air-fluid level, diminished air component from prior. Borders are less well-defined on the current exam not well assessed. There is mucus or debris within the right mainstem bronchus which completely fills bronchus intermedius. Subsequent near complete right lower and middle lobe collapse. Only small portion of aerated lung is visualized in the upper lobe and  anterior right lower lobe. Moderate left pleural effusion, similar to prior. Adjacent airspace disease appears more consolidative and groundglass than previous compressive atelectasis. Subpleural densities in the anterior left upper lobe of diminished from prior, trace residual pleural thickening, series 3, image 68. Previous left upper lobe pulmonary nodules are not definitively seen. There is Vence Lalor new lingular paramediastinal nodule measuring 9 mm, series 3, image 80. Upper Abdomen: No definite acute findings.  Cholecystectomy. Musculoskeletal: Increase in generalized body wall edema from prior exam. Diffuse thoracic spondylosis. There is Scharlene Catalina right anterolateral fourth rib fracture with question of underlying lucency. IMPRESSION: 1. Large partially loculated right pleural effusion has increased in size from prior exam. 2. Complete filling of right bronchus intermedius and near complete right lower and middle lobe collapse. 3. Right perihilar consolidation has worsened. Cavitary lesion in the right upper lobe with air-fluid level, diminished air component from prior. Borders are less well-defined on the current exam making size comparison difficult. 4. Left lower lobe airspace disease appears more consolidative and ground-glass on the previous compressive atelectasis, and is suspicious for pneumonia. Moderate left pleural effusion is similar. 5. New 9 mm lingular paramediastinal nodule, indeterminate. The previous peripheral left lung nodules have resolved. 6. Right anterolateral fourth rib fracture with question of underlying lucency, this may be due to subacute fracture or underlying lesion. 7. Increased body wall edema from prior exam. Aortic Atherosclerosis (ICD10-I70.0). Electronically Signed   By: [name] M.D.   On: 05/15/2020 22:55   CT Angio Chest PE W and/or Wo Contrast  Result Date: 05/14/2020 CLINICAL DATA:  PE suspected, high prob Progressive shortness of breath for 4 days. Productive cough.  Active treatment for lung cancer. EXAM: CT ANGIOGRAPHY  CHEST WITH CONTRAST TECHNIQUE: Multidetector CT imaging of the chest was performed using the standard protocol during bolus administration of intravenous contrast. Multiplanar CT image reconstructions and MIPs were obtained to evaluate the vascular anatomy. CONTRAST:  27mL OMNIPAQUE IOHEXOL 350 MG/ML SOLN COMPARISON:  Radiograph earlier today. Most recent chest CTA 03/24/2020 FINDINGS: Cardiovascular: There are no filling defects within the pulmonary arteries to suggest pulmonary embolus. The right upper lobe pulmonary arteries are attenuated. Aortic atherosclerosis without acute aortic finding. Right chest port with tip in the right atrium. Normal heart size. No pericardial effusion or thickening. Mediastinum/Nodes: 19 mm right lower paratracheal node, series 4, image 58, previously 22 mm. Multiple additional small mediastinal lymph nodes are not enlarged by size criteria. No definite new adenopathy. 12 mm right thyroid nodule. Not clinically significant; no follow-up imaging recommended (ref: J Am Coll Radiol. 2015 Feb;12(2): 143-50).No esophageal wall thickening. Lungs/Pleura: Moderately large partially loculated right pleural effusion. This has increased in size from prior exam with increased atelectasis in the right lower lobe. Right upper lobe cavitary lesion with air-fluid level, slightly increased, currently 5.9 cm, previously 4.6 cm by my retrospective measurement. Exact measurement difficult due to the ill-defined adjacent soft tissue density. Diffuse right perihilar consolidation and soft tissue density, progressed. Ill-defined spiculated densities in the aerated right upper lobe. There is narrowing of the right mainstem bronchus, with complete occlusion of the right middle and lower lobe bronchi, difficult to delineate if this is due to secretions or external compression. Right middle lobe appears completely atelectatic. Moderate left pleural  effusion is similar. 2 small left upper lobe pulmonary nodules peripherally measuring 5 and 6 mm, both on series 6, image 51, not seen on prior. Irregular subpleural density with ill-defined margins in the anterior left upper lobe, series 6, image 77, also new. Breathing motion artifact limits detailed assessment. There is no pneumothorax. Upper Abdomen: Upper abdominal atherosclerosis. Cholecystectomy. No acute upper abdominal findings. Musculoskeletal: No evidence of focal bone lesion. No acute osseous abnormalities are seen. Degenerative change throughout the spine. Review of the MIP images confirms the above findings. IMPRESSION: 1. No pulmonary embolus. 2. Moderately large partially loculated right pleural effusion which has increased in size from prior exam. Progressive right perihilar consolidation and soft tissue density with narrowing of the right mainstem bronchus and complete occlusion of the right middle and lower lobe bronchi. It is unclear if bronchial occlusion is due to retained secretions/mucus or external compression. Right middle lobe is completely atelectatic. 3. Cavitary lesion in the right upper lobe with equivocal increase in size from prior exam. 4. Two small left upper lobe pulmonary nodules measuring 5 and 6 mm, not seen on prior exam, nonspecific. No ill-defined subpleural density in the anterior left upper lobe. Recommend attention at follow-up. 5. Decreased size of right lower paratracheal node. Aortic Atherosclerosis (ICD10-I70.0). Electronically Signed   By: [name] M.D.   On: 05/14/2020 18:27   US RENAL  Result Date: 05/26/2020 CLINICAL DATA:  Acute kidney injury EXAM: RENAL / URINARY TRACT ULTRASOUND COMPLETE COMPARISON:  CT abdomen pelvis April 06, 2019 FINDINGS: Right Kidney: Renal measurements: 10.4 x 6.8 x 5.5 cm = volume: 202 mL. Echogenicity within normal limits. Lower pole renal cyst measuring 3 cm. No solid mass or hydronephrosis visualized. Left Kidney: Renal  measurements: 10.7 x 6.2 x 5.6 cm = volume: 196 mL. Echogenicity within normal limits. No mass or hydronephrosis visualized. Bladder: Appears normal for degree of bladder distention. Other: Trace perihepatic free fluid. IMPRESSION: No hydronephrosis.  Electronically Signed   By: Dahlia Bailiff MD   On: 05/26/2020 08:52   DG Chest Port 1 View  Result Date: 05/13/2020 CLINICAL DATA:  Short of breath.  Lung cancer. EXAM: PORTABLE CHEST 1 VIEW COMPARISON:  05/15/2020 and older exams. FINDINGS: Near complete opacification of the right hemithorax with finding loss. Portions of the right lung are aerated, which were not aerated on the prior exam. Left lung is hyperexpanded. There is hazy opacity at the left lung base which is improved from the prior study, consistent with Marvell Tamer combination of pleural fluid and atelectasis as noted on the CT dated 05/14/2020. Remainder of the left lung is clear. Cardiac silhouette normal in size. No left hilar masses. Normal contour of the left side of the mediastinum. Right hilum and right mediastinum obscured by contiguous lung opacity. No pneumothorax. Right internal jugular power Port-Shalisa Mcquade-Cath is stable. IMPRESSION: 1. No acute findings. 2. Mild improvement when compared to the prior chest radiograph. There is more right lung aerated, although most of the right hemithorax remains opacified. Opacity at the left lung base has improved consistent with Vieva Brummitt decrease pleural effusions/atelectasis. Electronically Signed   By: Lajean Manes M.D.   On: 06/02/2020 20:01   DG CHEST PORT 1 VIEW  Result Date: 05/15/2020 CLINICAL DATA:  Pleural effusion. EXAM: PORTABLE CHEST 1 VIEW COMPARISON:  CT 05/14/2020.  Chest x-ray 05/14/2020. FINDINGS: PowerPort catheter stable position. Heart size stable. Right mainstem bronchus may be occluded. Progressive opacification right upper hemithorax most consistent with progressive atelectasis/infiltrate and right pleural effusion. Pleural effusion may be loculated.  Diffuse left lung infiltrate/edema noted on today's exam. Small left pleural effusion also noted. No pneumothorax. IMPRESSION: 1. PowerPort catheter in stable position. 2. Right mainstem bronchus may be occluded. Progressive opacification right upper hemithorax most consistent with progressive atelectasis/infiltrate and right pleural effusion. Pleural effusion may be loculated. 3. Diffuse left lung infiltrate/edema and small left pleural effusion noted on today's exam. Electronically Signed   By: Marcello Moores  Register   On: 05/15/2020 06:51   VAS [place] LOWER EXTREMITY VENOUS (DVT)  Result Date: 05/26/2020  Lower Venous DVT Study Indications: D-Dimer. Other Indications: COVID. Risk Factors: Cancer Small cell Lung. Limitations: Extremely bright room. Staff said there was no way to block out the sun. Comparison Study: No previous                    Patient being put on palliative care with poor prognosis Performing Technologist: Vonzell Schlatter RVT  Examination Guidelines: Jejuan Scala complete evaluation includes B-mode imaging, spectral Doppler, color Doppler, and power Doppler as needed of all accessible portions of each vessel. Bilateral testing is considered an integral part of Destinae Neubecker complete examination. Limited examinations for reoccurring indications may be performed as noted. The reflux portion of the exam is performed with the patient in reverse Trendelenburg.  +---------+---------------+---------+-----------+----------+--------------+ RIGHT    CompressibilityPhasicitySpontaneityPropertiesThrombus Aging +---------+---------------+---------+-----------+----------+--------------+ CFV      Full           Yes      Yes                                 +---------+---------------+---------+-----------+----------+--------------+ SFJ      Full                                                        +---------+---------------+---------+-----------+----------+--------------+  FV Prox  Full                                                         +---------+---------------+---------+-----------+----------+--------------+ FV Mid   Full                                                        +---------+---------------+---------+-----------+----------+--------------+ FV DistalFull                                                        +---------+---------------+---------+-----------+----------+--------------+ PFV      Full                                                        +---------+---------------+---------+-----------+----------+--------------+ POP      Full           Yes      Yes                                 +---------+---------------+---------+-----------+----------+--------------+ PTV      Full                                                        +---------+---------------+---------+-----------+----------+--------------+ PERO     Full                                                        +---------+---------------+---------+-----------+----------+--------------+   +---------+---------------+---------+-----------+----------+--------------+ LEFT     CompressibilityPhasicitySpontaneityPropertiesThrombus Aging +---------+---------------+---------+-----------+----------+--------------+ CFV      Full           Yes      Yes                                 +---------+---------------+---------+-----------+----------+--------------+ SFJ      Full                                                        +---------+---------------+---------+-----------+----------+--------------+ FV Prox  Full                                                        +---------+---------------+---------+-----------+----------+--------------+  FV Mid   Full                                                        +---------+---------------+---------+-----------+----------+--------------+ FV DistalFull                                                         +---------+---------------+---------+-----------+----------+--------------+ PFV      Full                                                        +---------+---------------+---------+-----------+----------+--------------+ POP      Full           Yes      Yes                                 +---------+---------------+---------+-----------+----------+--------------+ PTV      Full                                                        +---------+---------------+---------+-----------+----------+--------------+ PERO     Full                                                        +---------+---------------+---------+-----------+----------+--------------+  Summary: BILATERAL: - No evidence of deep vein thrombosis seen in the lower extremities, bilaterally. -No evidence of popliteal cyst, bilaterally.   *See table(s) above for measurements and observations. Electronically signed by Ruta Hinds MD on 05/26/2020 at 4:25:44 PM.    Final     Microbiology Recent Results (from the past 240 hour(s))  Blood Culture (routine x 2)     Status: None (Preliminary result)   Collection Time: 06/06/2020  7:39 PM   Specimen: BLOOD  Result Value Ref Range Status   Specimen Description   Final    BLOOD RIGHT CHEST Performed at [facility], [place] [address], [place], [place] [address]    Special Requests   Final    BOTTLES DRAWN AEROBIC AND ANAEROBIC Blood Culture adequate volume Performed at [facility] [address], [place], [place] [address]    Culture   Final    NO GROWTH < 12 HOURS Performed at [facility] [address], [place], [place] [address]    Report Status PENDING  Incomplete  Blood Culture (routine x 2)     Status: None (Preliminary result)   Collection Time: 06/03/2020  9:30 PM   Specimen: BLOOD  Result Value Ref Range Status   Specimen Description   Final    BLOOD LEFT ANTECUBITAL Performed at [name],  [place] [address] [place], [place] [address]    Special Requests   Final    BOTTLES DRAWN AEROBIC AND ANAEROBIC Blood Culture adequate volume Performed at [facility] [address] [place], [place] [address]    Culture   Final    NO GROWTH < 12 HOURS Performed at [facility] [address], [place], [place] [address]    Report Status PENDING  Incomplete  Culture, sputum-assessment     Status: None   Collection Time: 05/26/20 12:22 AM   Specimen: Expectorated Sputum  Result Value Ref Range Status   Specimen Description EXPECTORATED SPUTUM  Final   Special Requests NONE  Final   Sputum evaluation   Final    THIS SPECIMEN IS ACCEPTABLE FOR SPUTUM CULTURE Performed at [facility], [place] [address] [place], [place] [address]    Report Status 05/26/2020 FINAL  Final  Culture, respiratory     Status: None (Preliminary result)   Collection Time: 05/26/20 12:22 AM  Result Value Ref Range Status   Specimen Description   Final    EXPECTORATED SPUTUM Performed at [facility] [address] [place], [place] [address]    Special Requests   Final    NONE Reflexed from [phone] Performed at [facility] [address] [place], [place] [address]    Dustin Stain   Final    ABUNDANT WBC PRESENT,BOTH PMN AND MONONUCLEAR MODERATE SQUAMOUS EPITHELIAL CELLS PRESENT ABUNDANT Dustin POSITIVE COCCI FEW Dustin NEGATIVE RODS    Culture   Final    FEW PSEUDOMONAS AERUGINOSA ABUNDANT STREPTOCOCCUS AGALACTIAE TESTING AGAINST S. AGALACTIAE NOT ROUTINELY PERFORMED DUE TO PREDICTABILITY OF AMP/PEN/VAN SUSCEPTIBILITY. Performed at [name], [place] [address] [place], [place] [address]    Report Status PENDING  Incomplete  Urine culture     Status: Abnormal   Collection Time: 05/26/20 12:57 AM   Specimen: Urine, Catheterized  Result Value Ref Range Status   Specimen Description   Final    URINE,  CATHETERIZED Performed at [facility] [address], [place], [place] [address]    Special Requests   Final    NONE Performed at [facility], [place] [address] [place], [place] [address]    Culture MULTIPLE SPECIES PRESENT, SUGGEST RECOLLECTION (Charnee Turnipseed)  Final   Report Status 06/04/20 FINAL  Final  MRSA PCR Screening     Status: None   Collection Time: 05/26/20  4:57 AM   Specimen: Nasopharyngeal  Result Value Ref Range Status   MRSA by PCR NEGATIVE NEGATIVE Final    Comment:        The GeneXpert MRSA Assay (FDA approved for NASAL specimens only), is one component of Belal Scallon comprehensive MRSA colonization surveillance program. It is not intended to diagnose MRSA infection nor to guide or monitor treatment for MRSA infections. Performed at [name], [place] [address] [place], [place] [address]     Lab Basic Metabolic Panel: Recent Labs  Lab 05/21/20 0434 05/15/2020 1939 05/14/2020 2300 05/26/20 0050 05/26/20 0420 05/26/20 0804  NA 142 131* 136 134* 134* 135  K 5.0 6.1* 5.7* 5.3* 5.2* 5.1  CL 100 95* 98 97* 97* 98  CO2 32 15* 19* 19* 23 25  GLUCOSE 97 415* 404* 311* 281* 208*  BUN 43* 89* 79* 87* 86* 90*  CREATININE 1.14 3.52* 3.78* 3.51* 3.53* 3.42*  CALCIUM 8.8* 8.2* 8.1* 7.9* 7.9* 7.8*  MG 2.1  --   --   --   --   --   PHOS 2.9  --   --   --   --   --    Liver Function Tests: Recent Labs  Lab 05/21/20 0434 06/09/2020 1939  AST 20 10*  ALT 37 20  ALKPHOS 96 113  BILITOT 0.5 2.2*  PROT 6.3* 5.8*  ALBUMIN 2.7* 2.2*   No results for input(s): LIPASE, AMYLASE in the last 168 hours. No results for input(s): AMMONIA in the last 168 hours. CBC: Recent Labs  Lab 05/21/20 0434 06/03/2020 1939 05/26/20 0420  WBC 10.6* 26.4* 19.1*  NEUTROABS  --  25.0*  --   HGB 9.4* 8.2* 8.2*  HCT 31.2* 27.8* 28.0*  MCV 101.6* 105.3* 103.7*  PLT 125* 89* 79*   Cardiac Enzymes: No results for input(s): CKTOTAL, CKMB,  CKMBINDEX, TROPONINI in the last 168 hours. Sepsis Labs: Recent Labs  Lab 05/21/20 0434 05/18/2020 1939 05/26/20 0420  PROCALCITON  --  11.80  --   WBC 10.6* 26.4* 19.1*  LATICACIDVEN  --  1.7  --     Procedures/Operations  See previousnotes   Fayrene Helper June 19, 2020, 8:59 PM

## 2020-06-10 NOTE — Progress Notes (Signed)
Morphine in NS 144mL,removed and wasted  wasted 60 mL  with Cami Microbiologist.

## 2020-06-10 NOTE — Progress Notes (Signed)
Pt unresponsive and apneic.  Unable to palpate carotid pulse.  DNR status confirmed.  Asystole on monitor and confirmed in two leads.  No breath sounds auscultated for full minute.  No heart sounds auscultated for full minute.  Pupils fixed and dilated.  Confirmed by second registered nurse. Patient pronounced dead at:  5

## 2020-06-10 NOTE — TOC Transition Note (Signed)
Transition of Care [facility]) - CM/SW Discharge Note   Patient Details  Name: [name] [name]: [id number] Date of Birth: 26-Mar-1946  Transition of Care [facility]) CM/SW Contact:  Leeroy Cha, RN Phone Number: June 20, 2020, 7:49 AM   Clinical Narrative:    Patient expired at [place] am of [id number]   Final next level of care: Expired Barriers to Discharge: Barriers Resolved   Patient Goals and CMS Choice Patient states their goals for this hospitalization and ongoing recovery are:: TO GET BETTER AND [place] CMS Medicare.gov Compare Post Acute Care list provided to:: Patient    Discharge Placement                       Discharge Plan and Services   Discharge Planning Services: CM Consult Post Acute Care Choice: [place]                               Social Determinants of Health (SDOH) Interventions     Readmission Risk Interventions No flowsheet data found.

## 2020-06-10 DEATH — deceased

## 2020-06-11 ENCOUNTER — Ambulatory Visit: Payer: Medicare Other

## 2020-07-04 ENCOUNTER — Other Ambulatory Visit: Payer: Medicare Other

## 2020-07-06 NOTE — Assessment & Plan Note (Signed)
Will eval with labs and CXR, but given  Inability to examine pt and complexity of case... very low threshold for in person ER exam.  pt given ER precautions and return precautions.

## 2020-07-07 ENCOUNTER — Ambulatory Visit: Payer: Medicare Other | Admitting: Internal Medicine

## 2020-07-07 ENCOUNTER — Other Ambulatory Visit: Payer: Self-pay | Admitting: Family Medicine

## 2020-07-11 ENCOUNTER — Ambulatory Visit: Payer: Medicare Other | Admitting: Internal Medicine

## 2020-08-13 ENCOUNTER — Ambulatory Visit: Payer: Medicare Other | Admitting: Family Medicine

## 2021-01-26 IMAGING — CT CT ANGIO CHEST
2 of 6 series · 18 of 36 positions shown · IV contrast (omnipaque)
Comparison: CT chest dated October 11, 2019

CLINICAL DATA: Syncope.

EXAM:
CT ANGIOGRAPHY CHEST WITH CONTRAST
TECHNIQUE: Multidetector CT imaging of the chest was performed using the
standard protocol during bolus administration of intravenous
contrast. Multiplanar CT image reconstructions and MIPs were
obtained to evaluate the vascular anatomy.
CONTRAST:  80mL OMNIPAQUE IOHEXOL 350 MG/ML SOLN

[Series 5: thins · axial · 0.77mm/px · z∈[-367,-63]mm · 17 of 343 slices shown]
[im 20/343  lung]
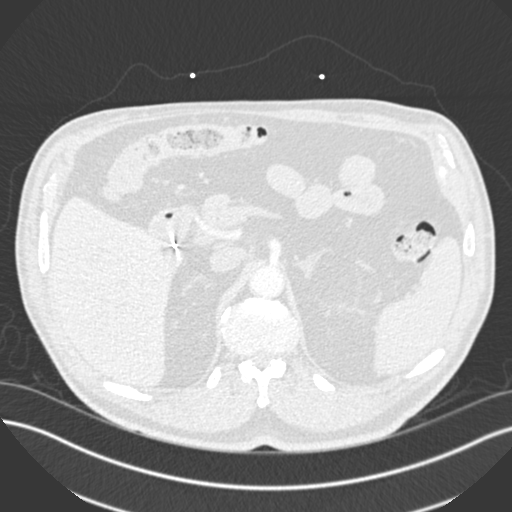
[im 39/343  mediastinal]
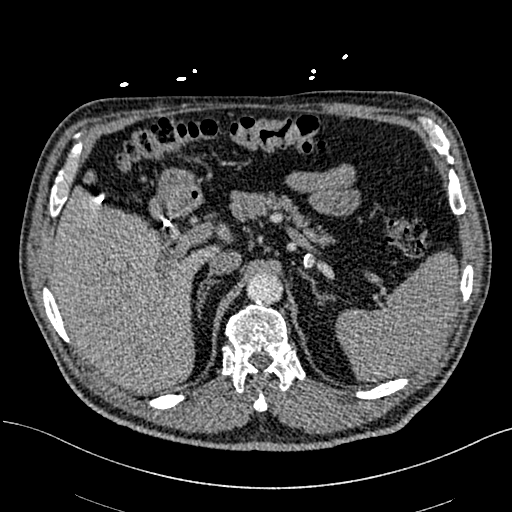
[im 58/343  lung]
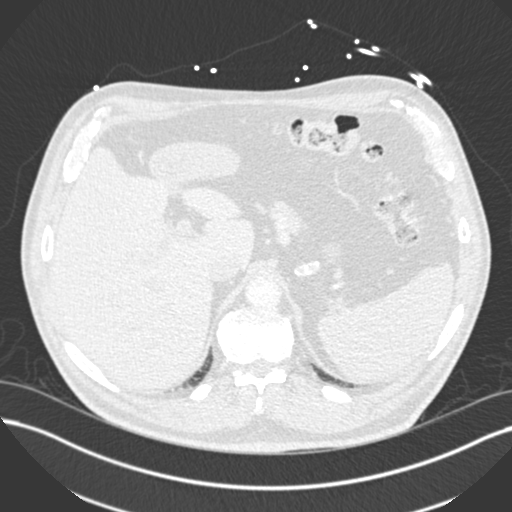
[im 77/343  mediastinal]
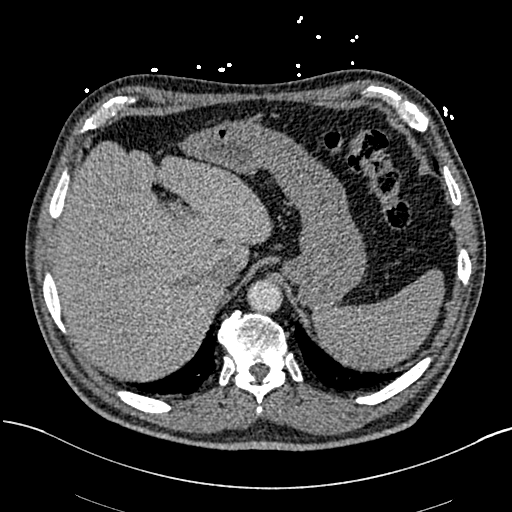
[im 96/343  lung]
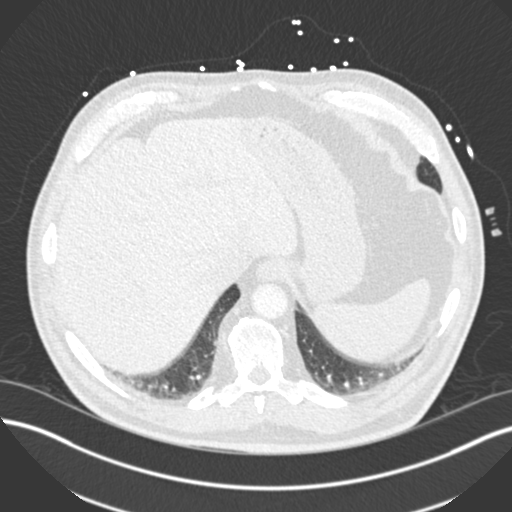
[im 115/343  mediastinal]
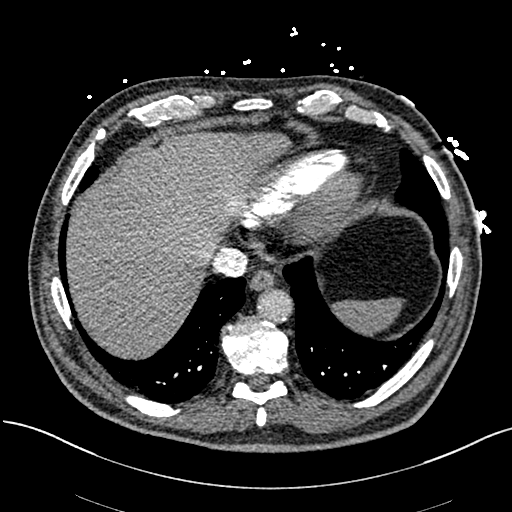
[im 134/343  lung]
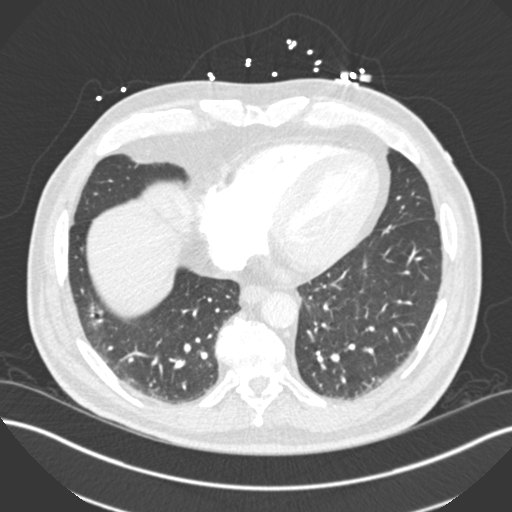
[im 153/343  mediastinal]
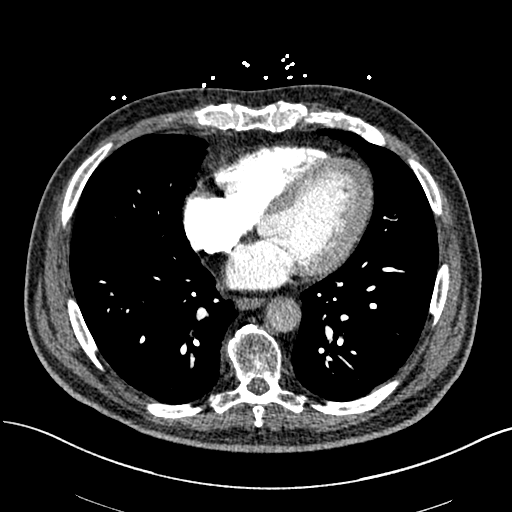
[im 172/343  lung]
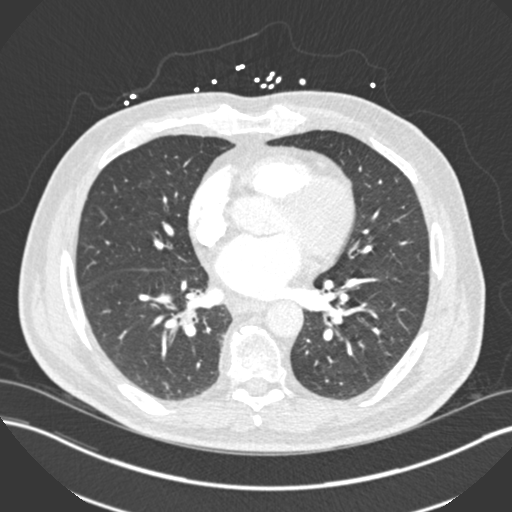
[im 191/343  mediastinal]
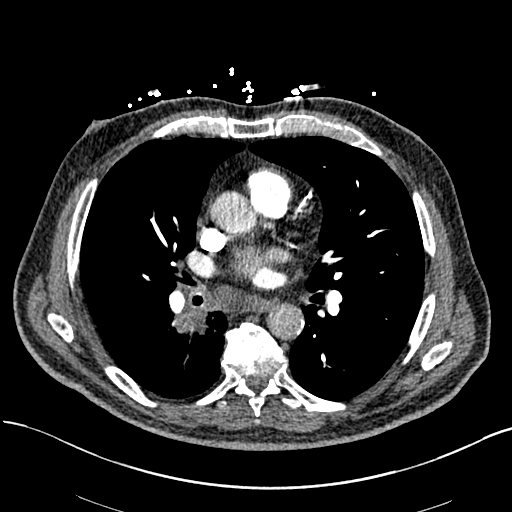
[im 210/343  lung]
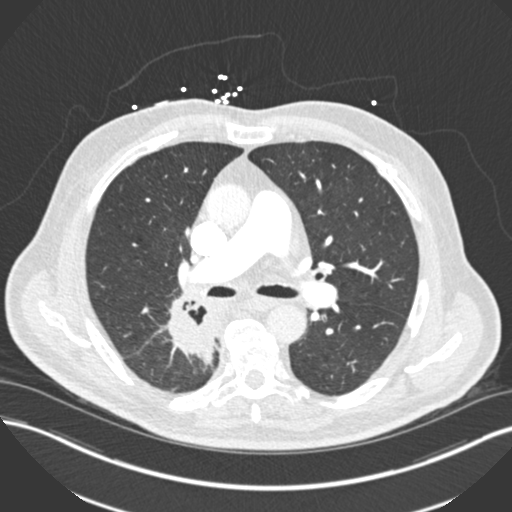
[im 229/343  mediastinal]
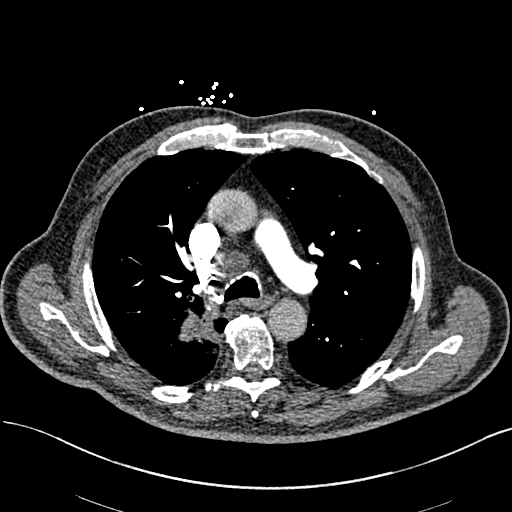
[im 248/343  lung]
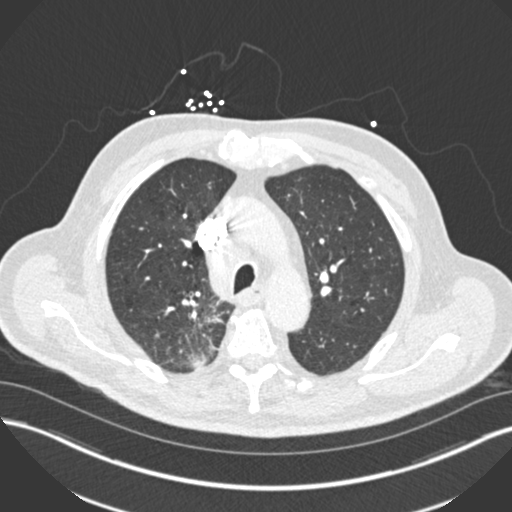
[im 267/343  mediastinal]
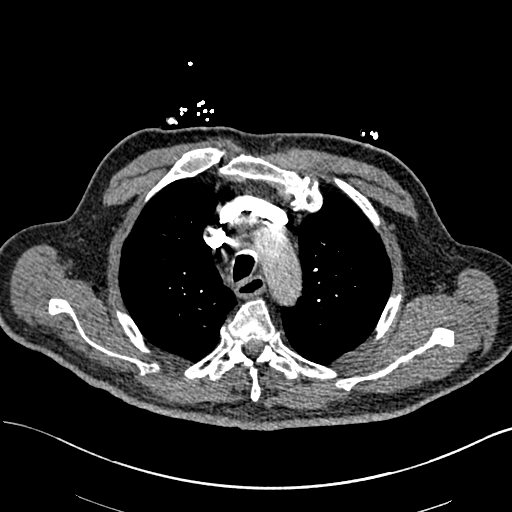
[im 286/343  lung]
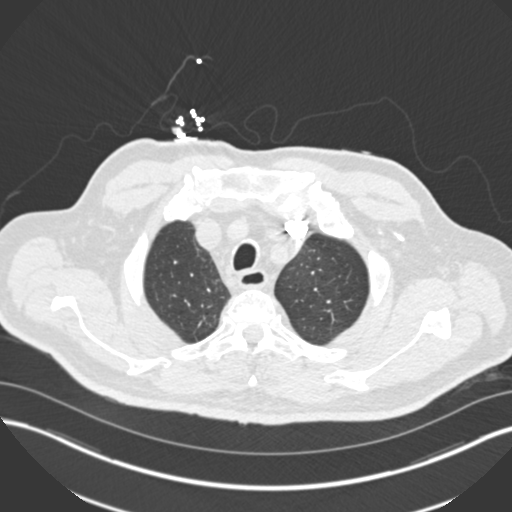
[im 305/343  mediastinal]
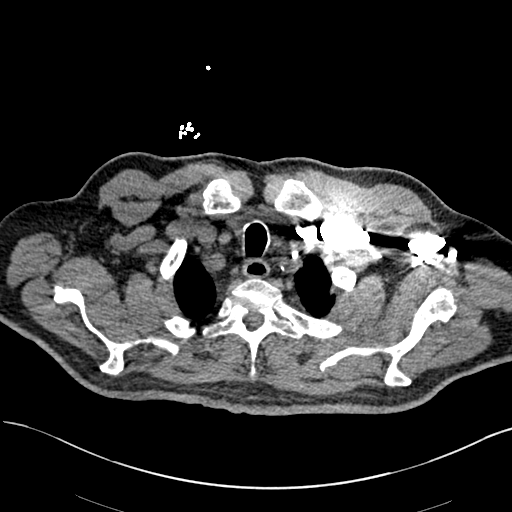
[im 324/343  lung]
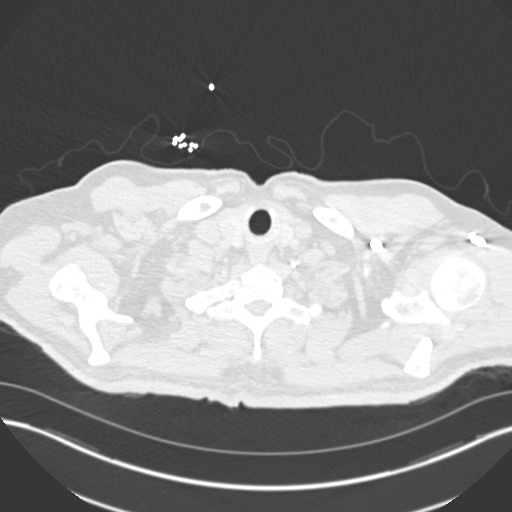

[Series 6: coronal mpr · coronal · 0.73mm/px · 1 of 141 slices shown]
[im 71/141  mediastinal]
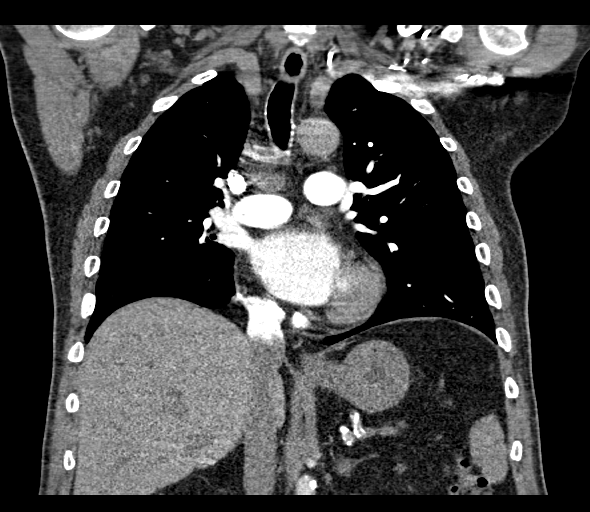

[18 of 36 positions shown; findings below may reference images not displayed]

FINDINGS: Cardiovascular: Contrast injection is sufficient to demonstrate
satisfactory opacification of the pulmonary arteries to the
segmental level. There is no pulmonary embolus or evidence of right
heart strain. The size of the main pulmonary artery is normal. Heart
size is normal, with no pericardial effusion. There are coronary
artery calcifications. There are minimal atherosclerotic changes of
the thoracic aorta without evidence for an aneurysm. There is no
significant pericardial effusion.

Mediastinum/Nodes:

--again noted are pathologically enlarged mediastinal lymph nodes.
The dominant lymph node measures approximately 2.4 x 4.3 cm
(previously measuring 2.7 by 6 cm).

--there are pathologically enlarged right hilar lymph nodes.

-- No axillary lymphadenopathy.

-- No supraclavicular lymphadenopathy.

--there is a right-sided hypoattenuating thyroid nodule measuring
approximately 1.5 cm (axial series 4, image 9).

-  Unremarkable esophagus.

Lungs/Pleura: Again noted is a right hilar mass. This mass has
demonstrated significant interval decrease in size and currently
measures approximately 4.7 x 4.8 cm (previously measuring
approximately 6.1 x 5.8 cm). There is new cavitation within the
central aspect of this mass. There is no pneumothorax. No large
pleural effusion.

Upper Abdomen: Contrast bolus timing is not optimized for evaluation
of the abdominal organs. The patient is status post prior
cholecystectomy. There is a probable nonobstructing stone in the
upper pole the left kidney. There is variant hepatic arterial
anatomy with a replaced common hepatic artery.

Musculoskeletal: There are healing fractures of the anterior there
through fifth ribs on the patient's left. There is no new acute
displaced fracture.

Review of the MIP images confirms the above findings.
IMPRESSION: 1. No evidence for acute pulmonary embolism.
2. Findings consistent with a positive response to treatment as
evidence by interval decrease in size of the right hilar mass and
mediastinal lymph nodes.
3. Right-sided hypoattenuating thyroid nodule measuring
approximately 1.5 cm. Outpatient follow-up ultrasound is recommended
if it has not already been performed.(Ref: [HOSPITAL]. [DATE]): 143-50).
4. Healing left-sided rib fractures.

Aortic Atherosclerosis ([id number]).

## 2023-12-19 NOTE — Progress Notes (Signed)
Orders placed in this encounter.
